# Patient Record
Sex: Female | Born: 1935
Health system: Southern US, Community
[De-identification: ages and names within clinical notes are randomized; demographics above are authoritative.]

## PROBLEM LIST (undated history)

## (undated) DIAGNOSIS — I509 Heart failure, unspecified: Secondary | ICD-10-CM

## (undated) DIAGNOSIS — G4700 Insomnia, unspecified: Secondary | ICD-10-CM

## (undated) DIAGNOSIS — T7840XA Allergy, unspecified, initial encounter: Secondary | ICD-10-CM

## (undated) DIAGNOSIS — Z86711 Personal history of pulmonary embolism: Secondary | ICD-10-CM

## (undated) DIAGNOSIS — M069 Rheumatoid arthritis, unspecified: Secondary | ICD-10-CM

## (undated) DIAGNOSIS — E785 Hyperlipidemia, unspecified: Secondary | ICD-10-CM

## (undated) DIAGNOSIS — L409 Psoriasis, unspecified: Secondary | ICD-10-CM

## (undated) DIAGNOSIS — Z9289 Personal history of other medical treatment: Secondary | ICD-10-CM

## (undated) DIAGNOSIS — J8410 Pulmonary fibrosis, unspecified: Secondary | ICD-10-CM

## (undated) DIAGNOSIS — I82409 Acute embolism and thrombosis of unspecified deep veins of unspecified lower extremity: Secondary | ICD-10-CM

## (undated) DIAGNOSIS — M810 Age-related osteoporosis without current pathological fracture: Secondary | ICD-10-CM

## (undated) DIAGNOSIS — M1990 Unspecified osteoarthritis, unspecified site: Secondary | ICD-10-CM

## (undated) DIAGNOSIS — I2510 Atherosclerotic heart disease of native coronary artery without angina pectoris: Secondary | ICD-10-CM

## (undated) HISTORY — PX: TOTAL KNEE ARTHROPLASTY: SHX125

## (undated) HISTORY — DX: Unspecified osteoarthritis, unspecified site: M19.90

## (undated) HISTORY — DX: Atherosclerotic heart disease of native coronary artery without angina pectoris: I25.10

## (undated) HISTORY — DX: Rheumatoid arthritis, unspecified: M06.9

## (undated) HISTORY — DX: Personal history of other medical treatment: Z92.89

## (undated) HISTORY — DX: Acute embolism and thrombosis of unspecified deep veins of unspecified lower extremity: I82.409

## (undated) HISTORY — DX: Age-related osteoporosis without current pathological fracture: M81.0

## (undated) HISTORY — DX: Allergy, unspecified, initial encounter: T78.40XA

## (undated) HISTORY — PX: CATARACT EXTRACTION: SUR2

## (undated) HISTORY — DX: Hyperlipidemia, unspecified: E78.5

## (undated) HISTORY — DX: Psoriasis, unspecified: L40.9

## (undated) HISTORY — DX: Insomnia, unspecified: G47.00

## (undated) HISTORY — DX: Personal history of pulmonary embolism: Z86.711

## (undated) HISTORY — DX: Pulmonary fibrosis, unspecified: J84.10

## (undated) HISTORY — PX: TONSILLECTOMY: SUR1361

---

## 1957-07-11 HISTORY — PX: APPENDECTOMY: SHX54

## 1972-07-11 HISTORY — PX: ABDOMINAL HYSTERECTOMY: SHX81

## 2004-11-08 ENCOUNTER — Encounter: Admission: RE | Admit: 2004-11-08 | Discharge: 2004-11-08 | Payer: Self-pay | Admitting: Endocrinology

## 2004-11-30 ENCOUNTER — Ambulatory Visit: Payer: Self-pay | Admitting: Pulmonary Disease

## 2005-03-15 ENCOUNTER — Encounter: Admission: RE | Admit: 2005-03-15 | Discharge: 2005-03-15 | Payer: Self-pay | Admitting: Endocrinology

## 2005-08-19 ENCOUNTER — Emergency Department (HOSPITAL_COMMUNITY): Admission: EM | Admit: 2005-08-19 | Discharge: 2005-08-19 | Payer: Self-pay | Admitting: Emergency Medicine

## 2005-09-01 ENCOUNTER — Other Ambulatory Visit: Admission: RE | Admit: 2005-09-01 | Discharge: 2005-09-01 | Payer: Self-pay | Admitting: Obstetrics and Gynecology

## 2005-09-02 ENCOUNTER — Ambulatory Visit: Admission: RE | Admit: 2005-09-02 | Discharge: 2005-09-02 | Payer: Self-pay | Admitting: Obstetrics and Gynecology

## 2005-10-18 ENCOUNTER — Ambulatory Visit: Payer: Self-pay | Admitting: Pulmonary Disease

## 2005-11-07 ENCOUNTER — Ambulatory Visit: Payer: Self-pay | Admitting: Pulmonary Disease

## 2005-11-10 ENCOUNTER — Ambulatory Visit: Payer: Self-pay | Admitting: Pulmonary Disease

## 2006-05-25 ENCOUNTER — Encounter: Admission: RE | Admit: 2006-05-25 | Discharge: 2006-05-25 | Payer: Self-pay | Admitting: Endocrinology

## 2006-06-29 ENCOUNTER — Ambulatory Visit: Payer: Self-pay | Admitting: Pulmonary Disease

## 2006-07-18 ENCOUNTER — Emergency Department (HOSPITAL_COMMUNITY): Admission: EM | Admit: 2006-07-18 | Discharge: 2006-07-18 | Payer: Self-pay | Admitting: Emergency Medicine

## 2006-12-06 ENCOUNTER — Ambulatory Visit: Payer: Self-pay | Admitting: Pulmonary Disease

## 2006-12-12 ENCOUNTER — Emergency Department (HOSPITAL_COMMUNITY): Admission: EM | Admit: 2006-12-12 | Discharge: 2006-12-12 | Payer: Self-pay | Admitting: Emergency Medicine

## 2006-12-13 ENCOUNTER — Encounter: Admission: RE | Admit: 2006-12-13 | Discharge: 2006-12-13 | Payer: Self-pay | Admitting: Endocrinology

## 2006-12-18 ENCOUNTER — Ambulatory Visit: Payer: Self-pay | Admitting: Internal Medicine

## 2006-12-18 LAB — CONVERTED CEMR LAB
BUN: 15 mg/dL (ref 6–23)
Basophils Relative: 0.4 % (ref 0.0–1.0)
CO2: 27 meq/L (ref 19–32)
Calcium: 9.4 mg/dL (ref 8.4–10.5)
Chloride: 110 meq/L (ref 96–112)
GFR calc Af Amer: 90 mL/min
INR: 1 (ref 0.9–2.0)
Lymphocytes Relative: 21.3 % (ref 12.0–46.0)
Monocytes Relative: 6.8 % (ref 3.0–11.0)
Neutro Abs: 7.4 10*3/uL (ref 1.4–7.7)
Platelets: 192 10*3/uL (ref 150–400)
Prothrombin Time: 11.8 s (ref 10.0–14.0)
RBC: 4.42 M/uL (ref 3.87–5.11)
RDW: 11.8 % (ref 11.5–14.6)

## 2006-12-19 ENCOUNTER — Ambulatory Visit: Payer: Self-pay | Admitting: Internal Medicine

## 2006-12-19 ENCOUNTER — Inpatient Hospital Stay (HOSPITAL_BASED_OUTPATIENT_CLINIC_OR_DEPARTMENT_OTHER): Admission: RE | Admit: 2006-12-19 | Discharge: 2006-12-19 | Payer: Self-pay | Admitting: Internal Medicine

## 2007-01-01 ENCOUNTER — Encounter: Payer: Self-pay | Admitting: Pulmonary Disease

## 2007-01-01 ENCOUNTER — Ambulatory Visit: Payer: Self-pay | Admitting: Internal Medicine

## 2007-01-03 ENCOUNTER — Ambulatory Visit: Payer: Self-pay | Admitting: Internal Medicine

## 2007-05-18 ENCOUNTER — Emergency Department (HOSPITAL_COMMUNITY): Admission: EM | Admit: 2007-05-18 | Discharge: 2007-05-18 | Payer: Self-pay | Admitting: Emergency Medicine

## 2007-06-16 ENCOUNTER — Encounter: Payer: Self-pay | Admitting: Pulmonary Disease

## 2007-06-16 DIAGNOSIS — E785 Hyperlipidemia, unspecified: Secondary | ICD-10-CM

## 2007-06-16 DIAGNOSIS — J309 Allergic rhinitis, unspecified: Secondary | ICD-10-CM | POA: Insufficient documentation

## 2007-06-16 DIAGNOSIS — Z86718 Personal history of other venous thrombosis and embolism: Secondary | ICD-10-CM | POA: Insufficient documentation

## 2007-07-03 ENCOUNTER — Ambulatory Visit: Payer: Self-pay | Admitting: Pulmonary Disease

## 2007-07-03 DIAGNOSIS — R079 Chest pain, unspecified: Secondary | ICD-10-CM

## 2007-07-16 ENCOUNTER — Telehealth (INDEPENDENT_AMBULATORY_CARE_PROVIDER_SITE_OTHER): Payer: Self-pay | Admitting: *Deleted

## 2007-08-31 ENCOUNTER — Encounter: Admission: RE | Admit: 2007-08-31 | Discharge: 2007-08-31 | Payer: Self-pay | Admitting: Endocrinology

## 2008-01-01 ENCOUNTER — Ambulatory Visit: Payer: Self-pay | Admitting: Pulmonary Disease

## 2008-01-03 ENCOUNTER — Ambulatory Visit: Payer: Self-pay | Admitting: Gastroenterology

## 2008-01-03 DIAGNOSIS — R1012 Left upper quadrant pain: Secondary | ICD-10-CM

## 2008-01-03 DIAGNOSIS — K5900 Constipation, unspecified: Secondary | ICD-10-CM | POA: Insufficient documentation

## 2008-01-09 ENCOUNTER — Ambulatory Visit: Payer: Self-pay | Admitting: Gastroenterology

## 2008-01-30 ENCOUNTER — Ambulatory Visit: Payer: Self-pay | Admitting: Pulmonary Disease

## 2008-02-05 ENCOUNTER — Telehealth (INDEPENDENT_AMBULATORY_CARE_PROVIDER_SITE_OTHER): Payer: Self-pay | Admitting: *Deleted

## 2008-05-30 ENCOUNTER — Encounter: Admission: RE | Admit: 2008-05-30 | Discharge: 2008-05-30 | Payer: Self-pay | Admitting: Otolaryngology

## 2008-06-30 ENCOUNTER — Ambulatory Visit: Payer: Self-pay | Admitting: Pulmonary Disease

## 2008-06-30 DIAGNOSIS — J8410 Pulmonary fibrosis, unspecified: Secondary | ICD-10-CM | POA: Insufficient documentation

## 2008-08-31 ENCOUNTER — Emergency Department (HOSPITAL_COMMUNITY): Admission: EM | Admit: 2008-08-31 | Discharge: 2008-08-31 | Payer: Self-pay | Admitting: Emergency Medicine

## 2008-11-21 ENCOUNTER — Ambulatory Visit (HOSPITAL_COMMUNITY): Admission: RE | Admit: 2008-11-21 | Discharge: 2008-11-21 | Payer: Self-pay | Admitting: Endocrinology

## 2008-12-11 ENCOUNTER — Encounter: Admission: RE | Admit: 2008-12-11 | Discharge: 2008-12-11 | Payer: Self-pay | Admitting: Endocrinology

## 2008-12-18 ENCOUNTER — Encounter: Admission: RE | Admit: 2008-12-18 | Discharge: 2008-12-31 | Payer: Self-pay | Admitting: Endocrinology

## 2009-02-05 ENCOUNTER — Ambulatory Visit: Payer: Self-pay | Admitting: Pulmonary Disease

## 2009-07-14 ENCOUNTER — Telehealth (INDEPENDENT_AMBULATORY_CARE_PROVIDER_SITE_OTHER): Payer: Self-pay | Admitting: *Deleted

## 2009-08-10 ENCOUNTER — Ambulatory Visit: Payer: Self-pay | Admitting: Pulmonary Disease

## 2009-08-10 ENCOUNTER — Encounter: Payer: Self-pay | Admitting: Pulmonary Disease

## 2009-08-12 ENCOUNTER — Telehealth (INDEPENDENT_AMBULATORY_CARE_PROVIDER_SITE_OTHER): Payer: Self-pay | Admitting: *Deleted

## 2009-08-18 ENCOUNTER — Ambulatory Visit: Payer: Self-pay | Admitting: Internal Medicine

## 2009-08-18 DIAGNOSIS — L299 Pruritus, unspecified: Secondary | ICD-10-CM

## 2009-08-25 LAB — CONVERTED CEMR LAB
ALT: 18 units/L (ref 0–35)
Alkaline Phosphatase: 73 units/L (ref 39–117)
Basophils Relative: 0.7 % (ref 0.0–3.0)
Bilirubin, Direct: 0.1 mg/dL (ref 0.0–0.3)
Eosinophils Relative: 1 % (ref 0.0–5.0)
Lymphocytes Relative: 36.4 % (ref 12.0–46.0)
MCV: 92.6 fL (ref 78.0–100.0)
Monocytes Absolute: 0.6 10*3/uL (ref 0.1–1.0)
Neutrophils Relative %: 54.6 % (ref 43.0–77.0)
RBC: 4.33 M/uL (ref 3.87–5.11)
Total Protein: 7.4 g/dL (ref 6.0–8.3)
WBC: 8 10*3/uL (ref 4.5–10.5)

## 2009-09-17 ENCOUNTER — Ambulatory Visit: Payer: Self-pay | Admitting: Internal Medicine

## 2010-02-08 ENCOUNTER — Ambulatory Visit: Payer: Self-pay | Admitting: Pulmonary Disease

## 2010-06-11 ENCOUNTER — Ambulatory Visit: Payer: Self-pay | Admitting: Pulmonary Disease

## 2010-06-11 ENCOUNTER — Telehealth: Payer: Self-pay | Admitting: Pulmonary Disease

## 2010-06-11 DIAGNOSIS — J209 Acute bronchitis, unspecified: Secondary | ICD-10-CM | POA: Insufficient documentation

## 2010-06-18 ENCOUNTER — Encounter
Admission: RE | Admit: 2010-06-18 | Discharge: 2010-06-18 | Payer: Self-pay | Source: Home / Self Care | Attending: Endocrinology | Admitting: Endocrinology

## 2010-06-23 ENCOUNTER — Telehealth: Payer: Self-pay | Admitting: Pulmonary Disease

## 2010-08-10 NOTE — Progress Notes (Signed)
  Phone Note Other Incoming   Caller: Jodie Action Taken: Information Sent Initial call taken by: Marijean Niemann all Recent Cardiac over to [facility] to fax [phone] [facility]  July 14, 2009 2:39 PM

## 2010-08-10 NOTE — Assessment & Plan Note (Signed)
Summary: rov for IPF   Visit Type:  Follow-up Primary Provider/Referring Provider:  Ardyth Harps MD  CC:  Pt is here for a 6 month f/u appt.  Pt had PFTs prior to appt today.  The patient c/o prod cough x1-2 months. Mucus is yellow in color. She says her SOB is no better or worse. [name]  History of Present Illness: The pt comes in today for f/u of her known ISLD, most c/w IPF.  She had pfts today which show no obstruction, mild restriction, and a severe decrease in dlco that corrects with Av.  The good news is they are unchanged from a year ago.  The pt feels that her exertional tolerance is near her usual baseline.  She has seen no decline in function.  She has had a little more cough than in the past, and can produce very scant quantities of mucus at times.  Current Medications (verified): 1)  Alprazolam 0.5 Mg Tabs (Alprazolam) .... Take By Mouth As Needed 2)  Citrucel  Powd (Methylcellulose (Laxative)) .... As Needed 3)  Phytosterol Complex 500 Mg Caps (Phytosterol Esters) .[name].. 1 By Mouth Daily 4)  Skeletal Strength .... 1 By Mouth Two Times A Day 5)  Food Enzumes .[name].. 1 By Mouth Two Times A Day 6)  Proactazyme .[name].. 1 By Mouth Two Times A Day  Allergies (verified): 1)  ! Niacin 2)  ! Penicillin  Review of Systems  The patient denies shortness of breath with activity, shortness of breath at rest, productive cough, non-productive cough, coughing up blood, chest pain, irregular heartbeats, acid heartburn, indigestion, loss of appetite, weight change, abdominal pain, difficulty swallowing, sore throat, tooth/dental problems, headaches, nasal congestion/difficulty breathing through nose, sneezing, itching, ear ache, anxiety, depression, hand/feet swelling, joint stiffness or pain, rash, change in color of mucus, and fever.    Vital Signs:  Patient profile:   77 year old female Height:      65 inches (165.10 cm) Weight:      160 pounds (72.73 kg) BMI:     26.72 O2 Sat:      92 % on Room  air Temp:     97.9 degrees F (36.61 degrees C) oral Pulse rate:   92 / minute BP sitting:   108 / 72  (left arm) Cuff size:   regular  Vitals Entered By: Michel Bickers CMA (February 08, 2010 10:02 AM)  O2 Sat at Rest %:  92 O2 Flow:  Room air CC: Pt is here for a 6 month f/u appt.  Pt had PFTs prior to appt today.  The patient c/o prod cough x1-2 months. Mucus is yellow in color. She says her SOB is no better or worse.  Comments Medications reviewed with patient Michel Bickers CMA  February 08, 2010 10:03 AM   Physical Exam  General:  wd female in nad Nose:  mild erythema of mucosa with mild edema bilat. Lungs:  crackles 1/3 up bilat, no wheezing or rhonchi Heart:  rrr, no mrg Extremities:  mild ankle edema with varicosities Neurologic:  alert and oriented, moves all 4.   Impression & Recommendations:  Problem # 1:  PULMONARY FIBROSIS, IDIOPATHIC (ICD-516.3) the pt most likely has IPF, but has had radiographic and pft stability.  She remains very functional, and reports no change in her baseline exertional tolerance.  Will continue to monitor her closely with cxr and pfts yearly, and would consider a trial of prednisone should she have a decline or acute  flare.  Problem # 2:  ALLERGIC RHINITIS (ICD-477.9)  I suspect her cough is related to postnasal drip from allergic rhinitis, although I still can't exclude mild chronic sinusitis.  Will get her to work on a good nasal hygiene regimen for a few weeks, and see if she has improvement.  Medications Added to Medication List This Visit: 1)  Phytosterol Complex 500 Mg Caps (Phytosterol esters) .[name].. 1 by mouth daily 2)  Skeletal Strength  .... 1 by mouth two times a day 3)  Food Enzumes  .Marland KitchenMarland Kitchen. 1 by mouth two times a day 4)  Proactazyme  .Marland KitchenMarland Kitchen. 1 by mouth two times a day 5)  Flonase 50 Mcg/act Susp (Fluticasone propionate) .... Two puffs each nostril daily  Other Orders: Est. Patient Level III (16109)  Patient Instructions: 1)  trial of  flonase nasal spray, 2 each nostril each am. 2)  try either zyrtec 10mg  or chlorpheniramine 8mg  at bedtime for next 3 weeks to see if nasal symptoms, cough improve 3)  stay as active as possible 4)  followup with me in 6mos with cxr on same day.  Prescriptions: FLONASE 50 MCG/ACT  SUSP (FLUTICASONE PROPIONATE) Two puffs each nostril daily  #1 x 6   Entered and Authorized by:   Barbaraann Share MD   Signed by:   Barbaraann Share MD on 02/08/2010   Method used:   Print then Give to Patient   RxID:   [id number]

## 2010-08-10 NOTE — Assessment & Plan Note (Signed)
Summary: allergies/self ref/apc   Vital Signs:  Patient profile:   78 year old female Height:      65 inches Weight:      159.50 pounds BMI:     26.64 O2 Sat:      97 % on Room air Pulse rate:   81 / minute BP sitting:   118 / 78  (left arm) Cuff size:   regular  Vitals Entered By: Reynaldo Minium CMA (August 18, 2009 2:39 PM)  O2 Flow:  Room air  Primary Provider/Referring Provider:  Ardyth Harps MD   History of Present Illness:  Dr Shelle Iron: 08/10/09-CC:  Pt is here for a 6 month f/u appt.   Pt c/o coughing up dark brown sputum.  Pt states "very little" increased sob but believes this is d/t the cold weather.  .  History of Present Illness: The pt comes in today for f/u of her ILD, presumed secondary to IPF.  We have been following her clinically, and she has been doing well.  She denies any change in her exertional tolerance, and was having no cough until Dec last year.  She developed a cough productive of discolored mucus, but decided to "ride it out".  It has improved, but is still present.  She denies any chest congestion, but does have paranasal sinus pressure and some congestion.  She denies definite postnasal drip.  August 18, 2009- New self referral - 59 yoF with c/o itiching. I reviewed Dr Teddy Spike recent notes. He4 sees her for ILD.[name] She says generalized itchinbg began about 2 months ago, without visible rash. Tongue swoll. Episodic, every 2-3 weeks. No respiratory distress, tearing, sneeze, wheeze cough, joint pain, nodes or fever. She thinks it may happen more often when she is home, but no real pattern of food, cosmetics or bath/ laundry products or other exposures. Last episode was 1-2 weeks ago. Antihistamines help within 30-45 minutes. No significant allergy history except sme rhinits with season change. No liver disease.    Current Medications (verified): 1)  Alprazolam 0.5 Mg Tabs (Alprazolam) .... Take By Mouth As Needed 2)  Nasonex 50 Mcg/act Susp (Mometasone  Furoate) .[name].. 1 Spray in Each Nostril Once Daily As Needed 3)  Citrucel  Powd (Methylcellulose (Laxative)) .... As Needed  Allergies (verified): 1)  ! Niacin 2)  ! 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 25+ yrs ago Alcohol Use - yes 2/month Daily Caffeine Use  3/week Illicit Drug Use - no  Risk Factors: Smoking Status: quit (01/03/2008)  Past Medical History: ALLERGIC RHINITIS (ICD-477.9) PULMONARY EMBOLISM, HX OF (ICD-V12.51) DEEP VENOUS THROMBOPHLEBITIS (ICD-453.40) DYSLIPIDEMIA (ICD-272.4) Pulmonary fibrosis  Family History: Family History of Heart Disease: Father died Family History of Kidney Disease: Daughter Mother - died age 34, old age  Review of Systems       The patient complains of shortness of breath at rest.  The patient denies shortness of breath with activity, productive cough, non-productive cough, coughing up blood, chest pain, irregular heartbeats, acid heartburn, indigestion, loss of appetite, weight change, abdominal pain, difficulty swallowing, sore throat, tooth/dental problems, headaches, nasal congestion/difficulty breathing through nose, sneezing, itching, ear ache, anxiety, depression, hand/feet  swelling, joint stiffness or pain, rash, change in color of mucus, and fever.    Physical Exam  Additional Exam:  General: A/Ox3; pleasant and cooperative, NAD, wdwn SKIN: no rash, lesions NODES: no lymphadenopathy HEENT: [place]/AT, EOM- WNL, Conjuctivae- clear, PERRLA, TM-WNL, Nose- clear, Throat- clear and wnl, dentures, Mellampatti  II NECK: Supple w/ fair ROM,  JVD- none, normal carotid impulses w/o bruits Thyroid- normal to palpation CHEST: Unlabored, few crackles, no dullness or cough HEART: RRR, no m/g/r heard ABDOMEN: Soft and nl; nml bowel sounds; no organomegaly or masses noted DGU:YQIH, nl pulses, no edema, cyanosis or clubbing, surgical scar left knee NEURO: Grossly intact to observation      Impression & Recommendations:  Problem # 1:  PRURITUS (ICD-698.9)  New complaint of nonspecific generalized pruitus without recognized trigger or pattern and without rash or systemic symptoms. First thought would be dry skin/ winter itch. Antinhsitamine relieves her that day, so elaborate changes may not be appropriate. I have discussed food diary and mentioned some possible associations to watch for. We will get some basic labs looking for allergic/ IgE pattern response. I'm not sure how reliable her sense of throat tightness may be.  Other Orders: New Patient Level III (47425) T-IgE (Immunoglobulin E) (95638-75643) TLB-CBC Platelet - w/Differential (85025-CBCD) TLB-Hepatic/Liver Function Pnl (80076-HEPATIC) TLB-Sedimentation Rate (ESR) (85652-ESR)  Patient Instructions: 1)  Please schedule a follow-up appointment in 1 month. 2)  Watch for patterns if the itching returns: what have you worn, eaten or otherwise been exposed to? 3)  Lab 4)  It is ok to take an antihistamine as needed

## 2010-08-10 NOTE — Progress Notes (Signed)
Summary: reaction/cb  Phone Note Call from Patient Call back at Home Phone [phone]   Caller: Patient Call For: clance Summary of Call: pt thinks she is having a reaction from her antibodic leviquin not sleeping and has little bumps on the top of her hands Initial call taken by: Lacinda Axon,  August 12, 2009 12:07 PM  Follow-up for Phone Call        pt states since she has started Levaquin she has been having trouble sleeping, and she has developed darks spot on both of her hands. She denies any swelling in face or mouth, and no difficulty breathing. Please advise. Carron Curie CMA  August 12, 2009 12:15 PM   Additional Follow-up for Phone Call Additional follow up Details #1::        I suspect these are not reactions to levaquin, but if they are, they due not represent an "allergic reaction".  I would continue to take abx, and if the areas on her hands worsen, then let us know Additional Follow-up by: Barbaraann Share MD,  August 12, 2009 4:54 PM    Additional Follow-up for Phone Call Additional follow up Details #2::    The patient had verbal understanding that Dr. Shelle Iron thinks she should finish the course of Levaquin and call if her sxs get worse. Follow-up by: Michel Bickers CMA,  August 12, 2009 5:17 PM

## 2010-08-10 NOTE — Miscellaneous (Signed)
Summary: Orders Update  Clinical Lists Changes  Orders: Added new Test order of T-2 View CXR (71020TC) - Signed 

## 2010-08-10 NOTE — Assessment & Plan Note (Signed)
Summary: 1 MONTH/ MBW   Primary Provider/Referring Provider:  Ardyth Harps MD  CC:  1 month follow up visit-no itching and doing "real good"..  History of Present Illness: History of Present Illness: The pt comes in today for f/u of her ILD, presumed secondary to IPF.  We have been following her clinically, and she has been doing well.  She denies any change in her exertional tolerance, and was having no cough until Dec last year.  She developed a cough productive of discolored mucus, but decided to "ride it out".  It has improved, but is still present.  She denies any chest congestion, but does have paranasal sinus pressure and some congestion.  She denies definite postnasal drip.  August 18, 2009- New self referral - 77 yoF with c/o itiching. I reviewed Dr Teddy Spike recent notes. He4 sees her for ILD.[name] She says generalized itchinbg began about 2 months ago, without visible rash. Tongue swoll. Episodic, every 2-3 weeks. No respiratory distress, tearing, sneeze, wheeze cough, joint pain, nodes or fever. She thinks it may happen more often when she is home, but no real pattern of food, cosmetics or bath/ laundry products or other exposures. Last episode was 1-2 weeks ago. Antihistamines help within 30-45 minutes. No significant allergy history except sme rhinits with season change. No liver disease.  September 17, 2009-Pruritus, rhinosinusitis, ILD, hx PE Has not had much itch or sneeze.since last here. Little cough- some morning phlegm. CBC/Diff, Sed rate, LFT and IgE were all normal.     Current Medications (verified): 1)  Alprazolam 0.5 Mg Tabs (Alprazolam) .... Take By Mouth As Needed 2)  Nasonex 50 Mcg/act Susp (Mometasone Furoate) .[name].. 1 Spray in Each Nostril Once Daily As Needed 3)  Citrucel  Powd (Methylcellulose (Laxative)) .... As Needed  Allergies (verified): 1)  ! Niacin 2)  ! 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    Vital Signs:  Patient profile:   77 year old female Height:      65 inches Weight:      162.13 pounds BMI:     27.08 O2 Sat:      92 % on Room air Pulse rate:   90 / minute BP sitting:   104 / 56  (left arm) Cuff size:   regular  Vitals Entered By: Reynaldo Minium CMA (September 17, 2009 2:31 PM)  O2 Flow:  Room air  Physical Exam  Additional Exam:  General: A/Ox3; pleasant and cooperative, NAD, wdwn SKIN: no rash, lesions NODES: no lymphadenopathy HEENT: [place]/AT, EOM- WNL, Conjuctivae- clear, PERRLA, TM-WNL, Nose- clear, Throat- clear and wnl, dentures, Mellampatti  II NECK:  Supple w/ fair ROM, JVD- none, normal carotid impulses w/o bruits Thyroid- normal to palpation CHEST: Definitie crackiles to midback are more evident to me than last visit. HEART: RRR, no m/g/r heard ABDOMEN: Soft and nl; nml bowel sounds; no organomegaly or masses noted ZOX:WRUE, nl pulses, no edema, cyanosis or clubbing, surgical scar left  knee NEURO: Grossly intact to observation      Impression & Recommendations:  Problem # 1:  PRURITUS (ICD-698.9)  This may have been dry skin/ winter itch. It seems to have resolved at this time and first pass lab results were normal. She is welconme to return as needed.  Other Orders: Est. Patient Level II (45409)  Patient Instructions: 1)  Please schedule a follow-up appointment as needed. 2)  Come back if you need me.

## 2010-08-10 NOTE — Progress Notes (Signed)
Summary: speak to nurse  Phone Note Call from Patient Call back at Home Phone [phone]   Caller: Patient Call For: clance Summary of Call: Pt c/o coughing, pain around back of rib cage  x a few days, worse this morning, pls advise.//[facility] [address] Initial call taken by: Darletta Moll,  June 11, 2010 10:45 AM  Follow-up for Phone Call        Spoke with pt and she states that she has a productive cough, yellow phlegm with some red mixed in. increased SOB, chest is soreness as well that has all been gettin gworse x 2 weeks. Pt scheduled to see Dr. Craige Cotta today at 4:30. Carron Curie CMA  June 11, 2010 12:19 PM

## 2010-08-10 NOTE — Assessment & Plan Note (Signed)
Summary: prod cough, blood tinged//jrc   Visit Type:  Acute visit Primary Provider/Referring Provider:  Ardyth Harps MD  CC:  Acute visit...[name] [name]...pt c/o cough with yellow blood-streaked mucus...chest tightness...sob with exertion and wheezing...all sxs x3 weeks.  History of Present Illness: The pt comes in today for f/u of her known ISLD, most c/w IPF.  She had pfts today which show no obstruction, mild restriction, and a severe decrease in dlco that corrects with Av.  The good news is they are unchanged from a year ago.  The pt feels that her exertional tolerance is near her usual baseline.  She has seen no decline in function.  She has had a little more cough than in the past, and can produce very scant quantities of mucus at times.  06/11/10 -- c/o more cough and chest congestion.  Has some discomfort in ribs with cough.  She has increased sputum with yellow color.  Also has some blood streaking in sputum.  She gets some wheeze that clears after she coughs.  She has been getting more winded.  She denies fever, but has some sinus congestion.  Her daughter had pneumonia over thanksgiving.   Current Medications (verified): 1)  Alprazolam 0.5 Mg Tabs (Alprazolam) .... Take By Mouth As Needed 2)  Citrucel  Powd (Methylcellulose (Laxative)) .... As Needed 3)  Phytosterol Complex 500 Mg Caps (Phytosterol Esters) .[name].. 1 By Mouth Daily 4)  Skeletal Strength .... 1 By Mouth Two Times A Day 5)  Food Enzumes .[name].. 1 By Mouth Two Times A Day 6)  Proactazyme .[name].. 1 By Mouth Two Times A Day 7)  Flonase 50 Mcg/act  Susp (Fluticasone Propionate) .... Two Puffs Each Nostril Daily  Allergies (verified): 1)  ! Niacin 2)  ! Penicillin  Past History:  Past Medical History: Reviewed history from 08/18/2009 and no changes required. ALLERGIC RHINITIS (ICD-477.9) PULMONARY EMBOLISM, HX OF (ICD-V12.51) DEEP VENOUS THROMBOPHLEBITIS (ICD-453.40) DYSLIPIDEMIA (ICD-272.4) Pulmonary fibrosis  Past  Surgical History: Reviewed history from 06/16/2007 and no changes required. Hysterectomy Appendectomy Knee Replacement 1997  Family History: Reviewed history from 08/18/2009 and no changes required. Family History of Heart Disease: Father died Family History of Kidney Disease: Daughter Mother - died age 77, old age  Social History: Reviewed history from 01/03/2008 and no changes required. 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 Alcohol Use - yes 2/month Daily Caffeine Use  3/week Illicit Drug Use - no  Vital Signs:  [name] profile:   77 year old female Height:      65 inches (165.10 cm) Weight:      148.25 pounds (67.39 kg) BMI:     24.76 O2 Sat:      98 % on Room air Temp:     97.4 degrees F (36.33 degrees C) oral Pulse rate:   77 / minute BP sitting:   116 / 78  (left arm) Cuff size:   regular  Vitals Entered By: Michel Bickers CMA (June 11, 2010 4:34 PM)  O2 Sat at Rest %:  98 O2 Flow:  Room air CC: Acute visit...[name] [name]...pt c/o cough with yellow blood-streaked mucus...chest tightness...sob with exertion and wheezing...all sxs x3 weeks Comments Medications reviewed with [name] Michel Bickers [facility]  June 11, 2010 4:45 PM   Physical Exam  General:  wd female in nad Nose:  mild erythema of mucosa with mild edema bilat. Mouth:  no exudate Neck:  no JVD.  Lungs:  crackles 1/3 up bilat, no wheezing or rhonchi Heart:  rrr, no mrg Abdomen:  soft, nontender Extremities:  mild ankle edema with varicosities Neurologic:  alert and oriented, moves all 4. Cervical Nodes:  no significant adenopathy Psych:  alert and cooperative; normal mood and affect; normal attention span and concentration   Impression & Recommendations:  Problem # 1:  ACUTE BRONCHITIS (ICD-466.0) She has an acute bronchitis.  I will chest xray to exclude other causes of her symptoms.  Will give her a course of  zithromax.  I don't think she needs prednisone or inhalers at this time.  Advised her to call if her symptoms do not improve.  Problem # 2:  PULMONARY FIBROSIS, IDIOPATHIC (ICD-516.3) Will further assess with chest xray.  She is to f/u with Dr. Shelle Iron.  Medications Added to Medication List This Visit: 1)  Zithromax Z-pak 250 Mg Tabs (Azithromycin) .... Use as directed  Complete Medication List: 1)  Alprazolam 0.5 Mg Tabs (Alprazolam) .... Take by mouth as needed 2)  Citrucel Powd (Methylcellulose (laxative)) .... As needed 3)  Phytosterol Complex 500 Mg Caps (Phytosterol esters) .[name].. 1 by mouth daily 4)  Skeletal Strength  .... 1 by mouth two times a day 5)  Food Enzumes  .Marland KitchenMarland Kitchen. 1 by mouth two times a day 6)  Proactazyme  .Marland KitchenMarland Kitchen. 1 by mouth two times a day 7)  Flonase 50 Mcg/act Susp (Fluticasone propionate) .... Two puffs each nostril daily 8)  Zithromax Z-pak 250 Mg Tabs (Azithromycin) .... Use as directed  Other Orders: Est. [name] Level IV (99214) T-2 View CXR (71020TC)  [name] Instructions: 1)  Zithromax (antibiotic): 2 pills on first day, then 1 pill once daily for 4 days 2)  Chest xray today 3)  Follow up with Dr. Shelle Iron in 2 months Prescriptions: ZITHROMAX Z-PAK 250 MG TABS (AZITHROMYCIN) use as directed  #1 x 0   Entered and Authorized by:   Coralyn Helling MD   Signed by:   Coralyn Helling MD on 06/11/2010   Method used:   Electronically to        [facility]. [phone]* (retail)       [address]       [place], [place]  [address]       Ph: [phone]       Fax: [phone]   RxID:   [id number]    Immunization History:  Influenza Immunization History:    Influenza:  historical (04/10/2010)

## 2010-08-10 NOTE — Assessment & Plan Note (Signed)
Summary: rov for ISLD, cough   Primary Provider/Referring Provider:  Ardyth Harps MD  CC:  Pt is here for a 6 month f/u appt.   Pt c/o coughing up dark brown sputum.  Pt states "very little" increased sob but believes this is d/t the cold weather.  .  History of Present Illness: The pt comes in today for f/u of her ISLD, presumed secondary to IPF.  We have been following her clinically, and she has been doing well.  She denies any change in her exertional tolerance, and was having no cough until Dec last year.  She developed a cough productive of discolored mucus, but decided to "ride it out".  It has improved, but is still present.  She denies any chest congestion, but does have paranasal sinus pressure and some congestion.  She denies definite postnasal drip.  Current Medications (verified): 1)  Alprazolam 0.5 Mg Tabs (Alprazolam) .... Take By Mouth As Needed 2)  Nasonex 50 Mcg/act Susp (Mometasone Furoate) .[name].. 1 Spray in Each Nostril Once Daily As Needed 3)  Citrucel  Powd (Methylcellulose (Laxative)) .... As Needed  Allergies (verified): 1)  ! Niacin 2)  ! Penicillin  Review of Systems      See HPI  Vital Signs:  Patient profile:   77 year old female Height:      65 inches Weight:      160.25 pounds BMI:     26.76 O2 Sat:      97 % on Room air Temp:     97.6 degrees F oral Pulse rate:   76 / minute BP sitting:   110 / 72  (left arm) Cuff size:   regular  Vitals Entered By: Arman Filter LPN (August 10, 2009 12:06 PM)  O2 Flow:  Room air CC: Pt is here for a 6 month f/u appt.   Pt c/o coughing up dark brown sputum.  Pt states "very little" increased sob but believes this is d/t the cold weather.   Comments Medications reviewed with patient Arman Filter LPN  August 10, 2009 12:06 PM    Physical Exam  General:  wd female in nad Nose:  no obvious anterior drainage. Lungs:  crackles 1/3 up bilat, but no wheezing or rhonchi Heart:  rrr Extremities:  no edema or  cyanosis Neurologic:  alert and oriented, moves all 4.   Impression & Recommendations:  Problem # 1:  SINUSITIS, ACUTE (ICD-461.9) the pt is having cough with purulent appearing mucus.  Although it has improved from Dec, it is persistent.  From her description, I suspect it is coming from acute sinusitis with postnasal drip.  Will treat with a short course of abx.  Problem # 2:  PULMONARY FIBROSIS, IDIOPATHIC (ICD-516.3) the pt appears to be at her usual baseline wrt her breathing.  Her cxr today is unchanged, and we will check her yearly pfts at the next visit this summer.  Medications Added to Medication List This Visit: 1)  Levaquin 750 Mg Tabs (Levofloxacin) .... One tablet by mouth daily  Other Orders: Est. Patient Level III (34742) Pulmonary Referral (Pulmonary)  Patient Instructions: 1)  take levaquin 750mg  one each day for 5 days for possible sinus infection 2)  use neilmed sinus rinses in am and pm until better, then as needed. 3)  followup with me in 6mos, and will do your yearly breathing studies at that time.   Prescriptions: LEVAQUIN 750 MG  TABS (LEVOFLOXACIN) One tablet by mouth daily  #5 x  0   Entered and Authorized by:   Barbaraann Share MD   Signed by:   Barbaraann Share MD on 08/10/2009   Method used:   Print then Give to Patient   RxID:   [id number]

## 2010-08-10 NOTE — Miscellaneous (Signed)
Summary: Orders Update pft charges  Clinical Lists Changes  Orders: Added new Service order of Carbon Monoxide diffusing w/capacity (94720) - Signed Added new Service order of Lung Volumes (94240) - Signed Added new Service order of Spirometry (Pre & Post) (94060) - Signed 

## 2010-08-11 ENCOUNTER — Ambulatory Visit: Payer: Self-pay | Admitting: Pulmonary Disease

## 2010-08-11 ENCOUNTER — Ambulatory Visit: Admit: 2010-08-11 | Payer: Self-pay | Admitting: Pulmonary Disease

## 2010-08-12 ENCOUNTER — Telehealth: Payer: Self-pay | Admitting: Pulmonary Disease

## 2010-08-12 NOTE — Progress Notes (Signed)
Summary: chest soreness  Phone Note Call from Patient Call back at Home Phone [phone]   Caller: Patient Call For: sood Reason for Call: Talk to Nurse Summary of Call: Patient calling w/ c/o chest soreness.  She was seen 12/2 and dx w/ bronchitis and was told to call if she had any other trouble.  Patient still has cough, but not congested.  Patient wanting to know if she needs appt or rx. Initial call taken by: Lehman Prom,  June 23, 2010 10:37 AM  Follow-up for Phone Call        Pt  states she saw [facility] on 06-11-10 and was given a zpak. She states she felt better while she was on abx but she has began to have productive cough with yellow phlegm. she states she is not coughing up as much phlegm but it is still present. She states she is still  having some chest tightness, but it has also improved some. She also states she still has SOB that is no better but no worse since last OV.  Pt wanted to know does she need another round of abx? Please advise. Carron Curie CMA  June 23, 2010 11:53 AM [email or web address]  Additional Follow-up for Phone Call Additional follow up Details #1::        Dr Craige Cotta put her on zpak.  let her know that zpak stays in system for 2 weeks, and would like to see how she does for the rest of the week. let us know friday if not making improvement. Additional Follow-up by: Barbaraann Share MD,  June 23, 2010 2:02 PM    Additional Follow-up for Phone Call Additional follow up Details #2::    Pt advsied of recs and will call if no improvment. Carron Curie CMA  June 23, 2010 2:11 PM

## 2010-08-18 NOTE — Progress Notes (Signed)
Summary: nos appt  Phone Note Call from Patient   Caller: [email or web address]  Call For: clance Summary of Call: Rsc nos from 2/1 to 2/14. Initial call taken by: Darletta Moll,  August 12, 2010 10:09 AM

## 2010-08-24 ENCOUNTER — Encounter: Payer: Self-pay | Admitting: Pulmonary Disease

## 2010-08-24 ENCOUNTER — Ambulatory Visit (INDEPENDENT_AMBULATORY_CARE_PROVIDER_SITE_OTHER): Payer: Medicare Other | Admitting: Pulmonary Disease

## 2010-08-24 DIAGNOSIS — J84111 Idiopathic interstitial pneumonia, not otherwise specified: Secondary | ICD-10-CM

## 2010-08-24 DIAGNOSIS — R05 Cough: Secondary | ICD-10-CM

## 2010-09-01 DIAGNOSIS — R05 Cough: Secondary | ICD-10-CM | POA: Insufficient documentation

## 2010-09-07 NOTE — Assessment & Plan Note (Signed)
Summary: rov for ISLD, cough   Primary Provider/Referring Provider:  Ardyth Harps MD  CC:  6 month f/u appt.  C/o sob with exertion and productive cough with "yellow and gray" colored sputum.  Pt c/o a "heaviness feeling" underneath breasts when she wakes up in the mornings. .  History of Present Illness: the pt comes in today for f/u of her known ISLD, probably secondary to IPF.  Overall, she is doing well.  However, has not been as active lately because of a CHI, and can tell that her endurance is not as good.  She is trying to get back to her active lifestyle.  Her only complaint today is mild cough with discolored mucus at times, but she believes this is coming from postnasal drip.  Current Medications (verified): 1)  Alprazolam 0.5 Mg Tabs (Alprazolam) .... Take By Mouth As Needed 2)  Citrucel  Powd (Methylcellulose (Laxative)) .... As Needed 3)  Phytosterol Complex 500 Mg Caps (Phytosterol Esters) .[name].. 1 By Mouth Daily 4)  Skeletal Strength .... 1 By Mouth Two Times A Day 5)  Food Enzumes .[name].. 1 By Mouth Two Times A Day 6)  Proactazyme .[name].. 1 By Mouth Two Times A Day 7)  Flonase 50 Mcg/act  Susp (Fluticasone Propionate) .... Two Puffs Each Nostril Daily As Needed 8)  Vitamin D .... Twice A Week  Allergies (verified): 1)  ! Niacin 2)  ! Penicillin  Review of Systems       The patient complains of shortness of breath with activity, productive cough, weight change, headaches, nasal congestion/difficulty breathing through nose, anxiety, hand/feet swelling, and change in color of mucus.  The patient denies shortness of breath at rest, non-productive cough, coughing up blood, chest pain, irregular heartbeats, acid heartburn, indigestion, loss of appetite, abdominal pain, difficulty swallowing, sore throat, tooth/dental problems, sneezing, itching, ear ache, depression, joint stiffness or pain, rash, and fever.    Vital Signs:  Patient profile:   77 year old female Height:      65  inches Weight:      164.50 pounds O2 Sat:      93 % on Room air Temp:     97.5 degrees F oral Pulse rate:   99 / minute BP sitting:   124 / 62  (left arm) Cuff size:   regular  Vitals Entered By: Arman Filter LPN (August 24, 2010 9:15 AM)  O2 Flow:  Room air CC: 6 month f/u appt.  C/o sob with exertion and productive cough with "yellow and gray" colored sputum.  Pt c/o a "heaviness feeling" underneath breasts when she wakes up in the mornings.  Comments Medications reviewed with patient Arman Filter LPN  August 24, 2010 9:15 AM    Physical Exam  General:  wd female in nad Nose:  erythematous mucosa, no purulence Mouth:  clear, but obvious drainage down back of throat. Lungs:  mild basilar crackles, no wheezing or rhonchi Heart:  rrr Extremities:  mild LE edema, no cyanosis  Neurologic:  alert, and oriented, moves all 4.   Impression & Recommendations:  Problem # 1:  PULMONARY FIBROSIS, IDIOPATHIC (ICD-516.3)  the pt is stable clinically and radiographically from this standpoint.    Problem # 2:  ALLERGIC RHINITIS (ICD-477.9) the pt's cough and mucus are coming from her upper airway, and she and I both feel this is postnasal drip.  Will try her on nasal ICS and otc antihistamine to see if improves.  Medications Added to Medication List This  Visit: 1)  Flonase 50 Mcg/act Susp (Fluticasone propionate) .... Two puffs each nostril daily as needed 2)  Vitamin D  .... Twice a week  Other Orders: Est. Patient Level III (16109)  Patient Instructions: 1)  will try on nasonex 2 sprays each nostril each am for a few weeks to see if helps postnasal drip 2)  trial of zyrtec 10mg  one each am for next few weeks 3)  followup with me in August with breathing studies the same day.

## 2010-10-09 ENCOUNTER — Emergency Department (INDEPENDENT_AMBULATORY_CARE_PROVIDER_SITE_OTHER): Payer: Medicare Other

## 2010-10-09 ENCOUNTER — Emergency Department (HOSPITAL_BASED_OUTPATIENT_CLINIC_OR_DEPARTMENT_OTHER)
Admission: EM | Admit: 2010-10-09 | Discharge: 2010-10-09 | Disposition: A | Discharge status: Home / Self Care | Payer: Medicare Other | Attending: Emergency Medicine | Admitting: Emergency Medicine

## 2010-10-09 DIAGNOSIS — J189 Pneumonia, unspecified organism: Secondary | ICD-10-CM | POA: Insufficient documentation

## 2010-10-09 DIAGNOSIS — R079 Chest pain, unspecified: Secondary | ICD-10-CM

## 2010-10-09 DIAGNOSIS — R0602 Shortness of breath: Secondary | ICD-10-CM | POA: Insufficient documentation

## 2010-10-09 DIAGNOSIS — R05 Cough: Secondary | ICD-10-CM

## 2010-10-09 DIAGNOSIS — R059 Cough, unspecified: Secondary | ICD-10-CM | POA: Insufficient documentation

## 2010-10-09 LAB — BASIC METABOLIC PANEL
BUN: 9 mg/dL (ref 6–23)
CO2: 24 mEq/L (ref 19–32)
Calcium: 8.7 mg/dL (ref 8.4–10.5)
GFR calc non Af Amer: >60 mL/min (ref >60)
Glucose, Bld: 97 mg/dL (ref 70–99)
Potassium: 3.9 mEq/L (ref 3.5–5.1)
Sodium: 139 mEq/L (ref 135–145)

## 2010-10-09 LAB — DIFFERENTIAL
Eosinophils Relative: 0 % (ref 0–5)
Lymphocytes Relative: 35 % (ref 12–46)
Lymphs Abs: 2.4 10*3/uL (ref 0.7–4.0)
Monocytes Absolute: 0.9 10*3/uL (ref 0.1–1.0)
Neutro Abs: 3.4 10*3/uL (ref 1.7–7.7)

## 2010-10-09 LAB — CBC
HCT: 36.9 % (ref 36.0–46.0)
Hemoglobin: 12.7 g/dL (ref 12.0–15.0)
MCHC: 34.4 g/dL (ref 30.0–36.0)
MCV: 85.4 fL (ref 78.0–100.0)
RDW: 13.3 % (ref 11.5–15.5)

## 2010-10-11 ENCOUNTER — Emergency Department (HOSPITAL_BASED_OUTPATIENT_CLINIC_OR_DEPARTMENT_OTHER)
Admission: EM | Admit: 2010-10-11 | Discharge: 2010-10-11 | Disposition: A | Discharge status: Home / Self Care | Payer: Medicare Other | Attending: Emergency Medicine | Admitting: Emergency Medicine

## 2010-10-11 DIAGNOSIS — T50995A Adverse effect of other drugs, medicaments and biological substances, initial encounter: Secondary | ICD-10-CM | POA: Insufficient documentation

## 2010-10-11 DIAGNOSIS — R220 Localized swelling, mass and lump, head: Secondary | ICD-10-CM | POA: Insufficient documentation

## 2010-10-11 DIAGNOSIS — Y92009 Unspecified place in unspecified non-institutional (private) residence as the place of occurrence of the external cause: Secondary | ICD-10-CM | POA: Insufficient documentation

## 2010-10-11 DIAGNOSIS — R319 Hematuria, unspecified: Secondary | ICD-10-CM | POA: Insufficient documentation

## 2010-10-11 DIAGNOSIS — R221 Localized swelling, mass and lump, neck: Secondary | ICD-10-CM | POA: Insufficient documentation

## 2010-10-11 LAB — URINE MICROSCOPIC-ADD ON

## 2010-10-11 LAB — URINALYSIS, ROUTINE W REFLEX MICROSCOPIC
Glucose, UA: NEGATIVE mg/dL
Ketones, ur: 15 mg/dL — AB
Leukocytes, UA: NEGATIVE
Nitrite: NEGATIVE
Protein, ur: 30 mg/dL — AB
Specific Gravity, Urine: 1.027 (ref 1.005–1.030)
Urobilinogen, UA: 1 mg/dL (ref 0.0–1.0)
pH: 6 (ref 5.0–8.0)

## 2010-10-13 ENCOUNTER — Ambulatory Visit: Payer: Medicare Other | Admitting: Adult Health

## 2010-10-18 ENCOUNTER — Telehealth: Payer: Self-pay | Admitting: Pulmonary Disease

## 2010-10-18 NOTE — Telephone Encounter (Signed)
Spoke w/ pt and she states when she came back from [place] she became very sick. Pt states her pmd keeps changing her meds and she is just getting worse. Pt states she has had some increased SOB and increased cough. Pt is coming in to see [facility] 4/11 at 2:00. Pt advised me she will be fine until then but would call us if she needs [place]

## 2010-10-19 ENCOUNTER — Encounter: Payer: Self-pay | Admitting: Pulmonary Disease

## 2010-10-20 ENCOUNTER — Ambulatory Visit (INDEPENDENT_AMBULATORY_CARE_PROVIDER_SITE_OTHER): Payer: Medicare Other | Admitting: Pulmonary Disease

## 2010-10-20 ENCOUNTER — Encounter: Payer: Self-pay | Admitting: Pulmonary Disease

## 2010-10-20 ENCOUNTER — Ambulatory Visit (INDEPENDENT_AMBULATORY_CARE_PROVIDER_SITE_OTHER)
Admission: RE | Admit: 2010-10-20 | Discharge: 2010-10-20 | Disposition: A | Discharge status: Home / Self Care | Payer: Medicare Other | Source: Ambulatory Visit | Attending: Pulmonary Disease | Admitting: Pulmonary Disease

## 2010-10-20 DIAGNOSIS — J189 Pneumonia, unspecified organism: Secondary | ICD-10-CM

## 2010-10-20 DIAGNOSIS — J84111 Idiopathic interstitial pneumonia, not otherwise specified: Secondary | ICD-10-CM

## 2010-10-20 MED ORDER — LEVOFLOXACIN 750 MG PO TABS: 750.0000 mg | ORAL_TABLET | Freq: Every day | ORAL | Status: AC

## 2010-10-20 MED ORDER — PREDNISONE 10 MG PO TABS: ORAL_TABLET | ORAL | Status: DC

## 2010-10-20 NOTE — Progress Notes (Signed)
  Subjective:    Patient ID: [name] [name], female    DOB: March 07, 1931, 77 y.o.   MRN: [id number]  HPI The pt comes in today for an acute sick visit.  She has known IPF, and recently diagnosed with pna while visiting [place].  She was treated with IV rocephin, then ofloxacin and was much improved.  When she returned to [place], her family member insisted she go to ER to "get checked out", and was given avelox and tussionex.  She developed ?allergy to something, and was changed to a zpak.  She comes in today where she remains very congested, is sob, and is still coughing with purulent mucus.  She denies having any fever.     Review of Systems  Constitutional: Positive for chills and diaphoresis. Negative for fever and unexpected weight change.  HENT: Positive for ear pain, nosebleeds, congestion, sore throat, rhinorrhea, sneezing, postnasal drip and sinus pressure. Negative for trouble swallowing and dental problem.   Eyes: Positive for itching. Negative for redness.  Respiratory: Positive for cough, chest tightness, shortness of breath and wheezing.   Cardiovascular: Negative for palpitations and leg swelling.  Gastrointestinal: Positive for nausea. Negative for vomiting.  Genitourinary: Negative for dysuria.  Musculoskeletal: Negative for joint swelling.  Skin: Negative for rash.  Neurological: Negative for headaches.  Hematological: Does not bruise/bleed easily.  Psychiatric/Behavioral: Negative for dysphoric mood. The patient is not nervous/anxious.        Objective:   Physical Exam Wd female in nad, but does appear ill Nares without d/c or purulence Op clear Chest with basilar crackles 1/2 up bilat, no wheezing Cor with rrr LE with mild edema, no cyanosis  Alert and oriented, moves all 4        Assessment & Plan:

## 2010-10-20 NOTE — Patient Instructions (Addendum)
Will treat with levaquin 750mg  one each day for 7days Prednisone taper as written mucinex dm extra strength one in am and pm for one week Will check cxr today, and will call you with results.  followup with me in 2 weeks.

## 2010-10-23 NOTE — Assessment & Plan Note (Signed)
The pt was diagnosed with pna on a recent trip to [place], and it is unclear if she has really gotten over this infection.  She is still symptomatic with purulent secretions, and it is obvious she needs further abx.  The recent rx with zpak was likely insufficient.  Will also check cxr today to make sure she does not have worsening infiltrate or complication such as pleural effusion.

## 2010-10-23 NOTE — Assessment & Plan Note (Signed)
The pt appears stable from this standpoint.  Will do f/u pfts this summer for comparison

## 2010-11-01 ENCOUNTER — Encounter: Payer: Self-pay | Admitting: Pulmonary Disease

## 2010-11-03 ENCOUNTER — Ambulatory Visit (INDEPENDENT_AMBULATORY_CARE_PROVIDER_SITE_OTHER): Payer: Medicare Other | Admitting: Pulmonary Disease

## 2010-11-03 ENCOUNTER — Encounter: Payer: Self-pay | Admitting: Pulmonary Disease

## 2010-11-03 DIAGNOSIS — J84111 Idiopathic interstitial pneumonia, not otherwise specified: Secondary | ICD-10-CM

## 2010-11-03 DIAGNOSIS — J019 Acute sinusitis, unspecified: Secondary | ICD-10-CM

## 2010-11-03 MED ORDER — MOXIFLOXACIN HCL 400 MG PO TABS: 400.0000 mg | ORAL_TABLET | Freq: Every day | ORAL | Status: AC

## 2010-11-03 NOTE — Patient Instructions (Signed)
Stop symbicort Will treat for sinusitis with avelox 400mg  one each day for 10 days. neilmed sinus rinses am and pm for 10 days If doing well, followup with me in 6mos.  If not improving, need to call me.

## 2010-11-03 NOTE — Assessment & Plan Note (Signed)
The pt is continuing to have sinus congestion, nasal voice, and cough with purulent mucus.  Her nasal exam is clearly abnormal today.  I am concerned her persistent symptoms are due to sinusitis.  Will extend her abx course, and work on nasal hygiene.

## 2010-11-03 NOTE — Assessment & Plan Note (Signed)
The pt feels her breathing has gotten back near her usual baseline.  She denies any chest congestion.

## 2010-11-03 NOTE — Progress Notes (Signed)
  Subjective:    Patient ID: [name] [name], female    DOB: 1930/12/01, 77 y.o.   MRN: [id number]  HPI The pt comes in today for f/u after her recent episode of acute asthmatic bronchitis.  She was treated with abx and a course of prednisone, and has had significant improvement.  She was also started on symbicort by her primary, and thinks this may have helped as well.  She has no airflow obstruction on pfts, but she may have had transient airway inflammation associated with her infection.  She feels she is 70% better, but still having cough with discolored mucus.  She has a very nasal voice with sinus congestion, but denies chest congestion.  She feels her sob has resolved.    Review of Systems  Constitutional: Negative for fever and unexpected weight change.  HENT: Negative for ear pain, nosebleeds, congestion, rhinorrhea, sneezing, trouble swallowing, dental problem, postnasal drip and sinus pressure.   Eyes: Positive for itching. Negative for redness.  Respiratory: Positive for cough, chest tightness, shortness of breath and wheezing.   Cardiovascular: Negative for palpitations and leg swelling.  Gastrointestinal: Negative for nausea and vomiting.  Genitourinary: Negative for dysuria.  Musculoskeletal: Negative for joint swelling.  Skin: Negative for rash.  Neurological: Negative for headaches.  Hematological: Does not bruise/bleed easily.  Psychiatric/Behavioral: Negative for dysphoric mood. The patient is not nervous/anxious.        Objective:   Physical Exam Wd female in nad Nares with swollen turbinates, inflammed mucosa, and purulence noted in right nare Op clear, no exudates Chest with basilar crackles, no wheezing or rhonchi Cor with rrr LE without edema, no cyanosis Alert and oriented, moves all 4        Assessment & Plan:

## 2010-11-23 NOTE — Cardiovascular Report (Signed)
[name], [name]                 ACCOUNT NO.:  [id number]   MEDICAL RECORD NO.:  [id number]          PATIENT TYPE:  OIB   LOCATION:  1967                         FACILITY:  [facility]   PHYSICIAN:  [name] [name], MDDATE OF BIRTH:  Feb 20, 1931   DATE OF PROCEDURE:  12/19/2006  DATE OF DISCHARGE:  12/19/2006                            CARDIAC CATHETERIZATION   PRIMARY CARE PHYSICIAN:  [name] [name], M.D.   PATIENT IDENTIFICATION:  The patient is a delightful 77 year old woman  with a history of pulmonary fibrosis and hyperlipidemia.  She has had  aggressive chest tightness and dyspnea on exertion.  We discussed the  risks and benefits of catheterization versus stress testing and she has  agreed to proceed with catheterization.  This was performed in the  outpatient catheterization lab.   PROCEDURES PERFORMED:  1. Right heart catheterization.  2. Left heart catheterization.  3. Left ventriculogram.  4. Selective coronary angiography.   DESCRIPTION OF PROCEDURE:  Risks and benefits of catheterization  explained.  Consent was signed and placed on the chart.  A 4-French  arterial sheath was placed in the right femoral artery using a modified  Seldinger technique.  Standard catheters including JL-4, 3-D RCA and  angled pigtail used for the procedure.  All catheter exchanges made over  wire.  A 7-French venous sheath was placed in the right femoral vein and  standard Swan-Ganz catheter was used for the right heart  catheterization.  There are no apparent complications.  Total 70 mL of  Omnipaque contrast was used.   HEMODYNAMIC RESULTS:  Right atrial pressure mean of 4, RV pressure 24/3.  PA pressure 27/11 with mean of 18.  Capillary wedge pressure mean 8.  Central aortic pressure 114/57 with a mean of 83.  RV pressure 115/2  with an EDP of 10.  Fick cardiac output was 4.3 L per minute.  Cardiac  index 2.4 L per minute per sq m.  There is no aortic stenosis or mitral   stenosis.   CORONARY ANATOMY:  Left main was normal.   The left anterior descending is a long vessel that wrapped the apex.  It  gave off two tiny diagonals and a large third diagonal.  There is a 30%  lesion in the mid LAD spanning the takeoff of the third diagonal.  There  was a 30% proximal lesion in the third diagonal.   Left circumflex was a moderate-sized vessel, gave off a tiny OM-1 as  well as a small OM-2 and OM-3.  There was a moderate sized OM-4.  There  were tandem 30% lesions in the mid left circumflex.   Right coronary artery was moderate-sized dominant vessel.  Gave off a  small RV branch, normal size PDA and a small posterolateral.  There is a  40% lesion in the mid section of the RCA.   Left ventriculogram done in the RAO position showed EF of 50-65% with no  focal wall motion abnormalities.   ASSESSMENT:  1. Mild nonobstructive coronary artery disease and a very mild      nonobstructive coronary disease as  described above.  2. Normal left ventricular function.  3. Normal pulmonary pressures.   PLAN:  I suspect [name] [name]'s symptoms may be related to  gastroesophageal reflux disease and related esophageal spasm.  We have  given her samples for a proton pump inhibitor and we will see how she  does.  Should she continue to have more symptoms, it may be reasonable  to have her formally evaluated by GI and perhaps consider a CAT scan of  her chest.      [name] Buckles. Bensimhon, MD  Electronically Signed     DRB/MEDQ  D:  12/19/2006  T:  12/20/2006  Job:  [id number]   cc:   [name] [name], M.D.

## 2010-11-23 NOTE — Consult Note (Signed)
[name] [name], [name] [name]                 ACCOUNT NO.:  [id number]   MEDICAL RECORD NO.:  [id number]           PATIENT TYPE:   LOCATION:                                 FACILITY:   PHYSICIAN:  Bevelyn Buckles. Bensimhon, MDDATE OF BIRTH:  11-28-1930   DATE OF CONSULTATION:  12/18/2006  DATE OF DISCHARGE:                                 CONSULTATION   REASON FOR CONSULTATION:  Chest pain and dyspnea on exertion.   HISTORY OF PRESENT ILLNESS:  [name] [name] is a very dynamic 77 year old  woman with a history of pulmonary fibrosis.  She denies any history of  known heart disease.  She has had a several-year history of intermittent  chest tightness over her left breast.  She apparently underwent a stress  test in [address] in 2004 which was negative.  She denies any  history of diabetes or hypertension.  She does have a history of  hyperlipidemia.   She tells me that over the past few years she has had intermittent chest  tightness.  Previously, it was once every few months.  However, over the  past few months she has noticed that it happens about three to four  times per week.  This can happen at any time, but mostly with stress.  There is no association with exertion.  She says she typically lies down  and the pain goes away after a few minutes.  It is associated with  shortness of breath, but no nausea or diaphoresis.  She also notes that  she has had progressive dyspnea on exertion over the past year.  She  attributes some of this to her arthritis in her right knee.  However,  she says she can still walk probably a mile at a slow pace.   This past Monday, she was lying in bed when she had the sudden onset of  severe central chest pain.  This was associated with shortness of  breath.  She went to the emergency room.  The pain was improved with  sublingual nitroglycerin.  She had several sets of cardiac markers which  were negative.  D-dimer was at the upper limits of normal.  EKG was  nonacute.  She was discharged home.  She has not had any chest pressure  since.  She has talked to Dr. Evlyn Kanner, who referred her here for further  evaluation.   REVIEW OF SYSTEMS:  She does have a history of gastroesophageal reflux  disease, but does not associate this with her chest pain.  She denies  any nausea, vomiting, fevers, or chills.  She does have arthritis,  particularly in the right knee.  Denies any melena or bright red blood  per rectum.   The remainder of the review of systems is negative, except for the HPI  and problem list.   PROBLEM LIST:  1. History of chest pain, status post reported negative stress test      four years ago in [address].  2. Pulmonary fibrosis.  3. Hyperlipidemia.  4. Osteoarthritis, status post left knee replacement.  5. History of bladder  tack.   CURRENT MEDICATIONS:  1. Famotidine 40 mg a day.  2. Aspirin 325 a day.  3. Vitamin D.   ALLERGIES:  NIACIN, with a rash.   SOCIAL HISTORY:  She is widowed.  She lives alone.  She was a former  Horticulturist, commercial.  Denies any alcohol use.  She does have  a history of tobacco, but quit in 1978.   FAMILY HISTORY:  Mother died at 61 due to old age.  Father died at 13  due to hardening of arteries.  She denies any history of myocardial  infarction.  She has two sisters who died in their 83s due to  Alzheimer's.   PHYSICAL EXAMINATION:  GENERAL:  She looks younger than her stated age.  She ambulates around the clinic without any respiratory difficulty.  VITAL SIGNS:  Blood pressure on initial check was 108/70, on manual  recheck 86/58, heart rate is 94, weight is 158.  HEENT:  Normal.  NECK:  Supple.  No JVD.  Carotids are 2+ bilaterally, without any  bruits.  There is no lymphadenopathy or thyromegaly.  CARDIAC:  PMI is nondisplaced.  She has a regular rate and rhythm, with  an S4.  No murmur.  LUNGS:  Have dry crackles about halfway up.  No wheezes or rales.  EXTREMITIES:   Warm, with no cyanosis, clubbing, or edema.  No rash.  Distal pulses are 1+ bilaterally.  NEUROLOGIC:  She is alert and oriented x3.  Affect is very pleasant.  Cranial nerves II-XII are grossly intact.  She moves all four  extremities without difficulty.   EKG shows normal sinus rhythm at a rate of 94, with no significant ST-T  wave abnormalities.  There is a left anterior fascicular block.   ASSESSMENT AND PLAN:  Chest pain and dyspnea.  Despite a lack of  significant risk factors, I am very concerned that her symptoms  represent myocardial ischemia.  However, it is possible that she may  also have a component of esophageal spasm due to reflux.  She is  scheduled to go to [employer] later today, but I told her that she  should reconsider this until we further clear up her heart status.  I  had a long discussion about the risks and benefits of cardiac  catheterization versus stress testing.  We have decided to postpone her  trip and proceed with right and left heart catheterization tomorrow in  the outpatient catheterization laboratory.  We have given her samples of  a PPI as well as a prescription for sublingual nitroglycerin.  I will  discuss this with Dr. Evlyn Kanner.      Bevelyn Buckles. Bensimhon, MD  Electronically Signed     DRB/MEDQ  D:  12/18/2006  T:  12/18/2006  Job:  [id number]   cc:   Jeannett Senior A. Evlyn Kanner, M.D.

## 2010-11-23 NOTE — Assessment & Plan Note (Signed)
[facility]                            CARDIOLOGY OFFICE NOTE   [name] [name], [name] [name]                          MRN:          [id number]  DATE:01/03/2007                            DOB:          Sep 03, 1930    This is a patient of Dr. Gala Romney and Dr. Adrian Prince.   This is a 77 year old, white female who presented with chest pain. She  underwent cardiac catheterization on December 19, 2006 which revealed mild  nonobstructive coronary artery disease with normal LV function and  normal pulmonary pressures. These were checked because she does have a  diagnosis of pulmonary fibrosis. The patient was placed on Pepcid with  thoughts of sending her to a gastroenterologist if her symptoms did not  improve. CT of her chest was also considered because of her pulmonary  fibrosis diagnosis which this was done back in January 2008 that showed  bilateral right greater than left subpleural honeycombing consistent  with the usual interstitial pneumonia or idiopathic pulmonary fibrosis  drug toxicity or connective tissue disease. Her chest x-ray at that time  also was compatible with pulmonary fibrosis. No new acute processes.   The patient has done extremely well on Pepcid. She said she has been  very compliant and she has had absolutely no chest pain. She feels well  and has no cardiac complaints.   CURRENT MEDICATIONS:  1. Pepcid 40 mg daily.  2. Aspirin 325 daily.  3. Vitamin D three weekly.   PHYSICAL EXAMINATION:  GENERAL:  This is a very pleasant, young looking,  77 year old white female in no acute distress.  VITAL SIGNS:  Blood pressure 112/70, pulse 78, weight 154.  NECK:  Without JVD, HJR, bruit or thyroid enlargement.  LUNGS:  Decreased breath sounds with fine basilar crackles throughout.  HEART:  Regular rate and rhythm at 78 beats per minute, normal S1 and  S2. No murmur, rub, bruit, thrill or heave noted.  ABDOMEN:  Soft without organomegaly, masses,  lesions, or abnormal  tenderness.  GROIN:  The right groin is without hematoma or hemorrhage.  EXTREMITIES:  Without clubbing, cyanosis or edema. She has good distal  pulses.   IMPRESSION:  1. Chest pain felt secondary to gastroesophageal reflux disease.  2. Cardiac cath revealed mild nonobstructive coronary artery disease      and normal left ventricular function with normal pulmonary      pressures.  3. History of pulmonary fibrosis.  4. Hyperlipidemia.  5. Osteoarthritis.  6. History of bladder tack.   PLAN:  The patient is stable from a cardiac standpoint. She will see Dr.  Gala Romney in followup in 2 months.      Jacolyn Reedy, PA-C  Electronically Signed      [facility] Salvia, MD, [facility]  Electronically Signed   ML/MedQ  DD: 01/03/2007  DT: 01/03/2007  Job #: [id number]   cc:   Jeannett Senior A. Evlyn Kanner, M.D.

## 2011-02-22 ENCOUNTER — Ambulatory Visit: Payer: Medicare Other | Admitting: Pulmonary Disease

## 2011-04-28 LAB — DIFFERENTIAL
Basophils Absolute: 0.1
Basophils Relative: 2 — ABNORMAL HIGH
Eosinophils Absolute: 0.2
Monocytes Absolute: 0.7
Monocytes Relative: 9
Neutro Abs: 3.6
Neutrophils Relative %: 47

## 2011-04-28 LAB — POCT CARDIAC MARKERS
Myoglobin, poc: 47.4
Operator id: 4761

## 2011-04-28 LAB — POCT I-STAT 3, ART BLOOD GAS (G3+)
Bicarbonate: 24.2 — ABNORMAL HIGH
TCO2: 25
pH, Arterial: 7.378
pO2, Arterial: 72 — ABNORMAL LOW

## 2011-04-28 LAB — POCT I-STAT 3, VENOUS BLOOD GAS (G3P V)
Bicarbonate: 23.4
Bicarbonate: 25.6 — ABNORMAL HIGH
O2 Saturation: 71
O2 Saturation: 75
TCO2: 25
TCO2: 27
pCO2, Ven: 42.3 — ABNORMAL LOW
pCO2, Ven: 42.9 — ABNORMAL LOW
pO2, Ven: 39
pO2, Ven: 41

## 2011-04-28 LAB — URINALYSIS, ROUTINE W REFLEX MICROSCOPIC
Bilirubin Urine: NEGATIVE
Ketones, ur: NEGATIVE
Nitrite: NEGATIVE
Protein, ur: NEGATIVE
Urobilinogen, UA: 0.2

## 2011-04-28 LAB — BASIC METABOLIC PANEL
CO2: 28
Calcium: 9.1
Chloride: 102
Creatinine, Ser: 0.77
Glucose, Bld: 93

## 2011-04-28 LAB — CBC
MCHC: 33.9
MCV: 86.9
RDW: 12.8

## 2011-05-05 ENCOUNTER — Ambulatory Visit: Payer: Medicare Other | Admitting: Pulmonary Disease

## 2011-05-06 ENCOUNTER — Ambulatory Visit: Payer: Medicare Other | Admitting: Pulmonary Disease

## 2011-05-09 ENCOUNTER — Ambulatory Visit (INDEPENDENT_AMBULATORY_CARE_PROVIDER_SITE_OTHER): Payer: Medicare Other | Admitting: Pulmonary Disease

## 2011-05-09 ENCOUNTER — Encounter: Payer: Self-pay | Admitting: Pulmonary Disease

## 2011-05-09 DIAGNOSIS — Z23 Encounter for immunization: Secondary | ICD-10-CM

## 2011-05-09 DIAGNOSIS — J84111 Idiopathic interstitial pneumonia, not otherwise specified: Secondary | ICD-10-CM

## 2011-05-09 NOTE — Patient Instructions (Signed)
Please get your recent records from [facility] and drop off at office for my review.  I will call you once I do so to discuss. Will give you a flu shot today. followup with me in 6mos, but will call you after I review your records.

## 2011-05-09 NOTE — Assessment & Plan Note (Signed)
The patient has had decreased exercise tolerance since the last visit, but admits that she has not been doing her exercise regimen.  She now has been given a diagnosis of possible "rheumatoid lung", and I will need to get these records from [facility] for review.  This is a more treatable entity than IPF, however I would not subject her to aggressive immunosuppression unless she is having significant decline in her PFTs or quality of life.  She did have followup PFTs recently, and we'll compare these to last year.  I have stressed to her the importance of getting back on an exercise program, and have offered to refer her to pulmonary rehabilitation.  She would like to try it on her own first.  I have told her that I will call her once I receive her recent records from [place].

## 2011-05-09 NOTE — Progress Notes (Signed)
  Subjective:    Patient ID: [name] [name], female    DOB: October 19, 1930, 77 y.o.   MRN: [id number]  HPI The patient comes in today for followup of her known interstitial disease, felt secondary to idiopathic pulmonary fibrosis.  She has had stable symptoms and pulmonary function studies most recently, however over the summer began to have worsening dyspnea on exertion.  She admits that she had not been exercising, and deconditioning was playing a role.  She has been evaluated at [facility] and [facility] in the past, and decided to get a followup visit there.  Apparently, they did repeat blood work and told her that she had "rheumatoid lung" I do not have those records available to me for review.  She denies any significant cough or mucus production.   Review of Systems  Constitutional: Negative for fever and unexpected weight change.  HENT: Positive for sinus pressure. Negative for ear pain, nosebleeds, congestion, sore throat, rhinorrhea, sneezing, trouble swallowing, dental problem and postnasal drip.   Eyes: Positive for itching. Negative for redness.  Respiratory: Positive for cough, shortness of breath and wheezing. Negative for chest tightness.   Cardiovascular: Negative for palpitations and leg swelling.  Gastrointestinal: Negative for nausea and vomiting.  Genitourinary: Negative for dysuria.  Musculoskeletal: Negative for joint swelling.  Skin: Negative for rash.  Neurological: Positive for headaches.  Hematological: Does not bruise/bleed easily.  Psychiatric/Behavioral: Negative for dysphoric mood. The patient is not nervous/anxious.        Objective:   Physical Exam Well-developed female in no acute distress Nose without purulence or discharge noted Chest with crackles one third of the way up bilaterally, no wheezing noted Cardiac exam with regular rate and rhythm Lower extremities without edema, no cyanosis noted Alert and oriented, moves all 4 extremities.       Assessment &  Plan:

## 2011-07-13 DIAGNOSIS — M62838 Other muscle spasm: Secondary | ICD-10-CM | POA: Diagnosis not present

## 2011-07-13 DIAGNOSIS — M999 Biomechanical lesion, unspecified: Secondary | ICD-10-CM | POA: Diagnosis not present

## 2011-07-13 DIAGNOSIS — M5137 Other intervertebral disc degeneration, lumbosacral region: Secondary | ICD-10-CM | POA: Diagnosis not present

## 2011-07-15 DIAGNOSIS — M5137 Other intervertebral disc degeneration, lumbosacral region: Secondary | ICD-10-CM | POA: Diagnosis not present

## 2011-07-15 DIAGNOSIS — M62838 Other muscle spasm: Secondary | ICD-10-CM | POA: Diagnosis not present

## 2011-07-15 DIAGNOSIS — M999 Biomechanical lesion, unspecified: Secondary | ICD-10-CM | POA: Diagnosis not present

## 2011-07-18 DIAGNOSIS — M999 Biomechanical lesion, unspecified: Secondary | ICD-10-CM | POA: Diagnosis not present

## 2011-07-18 DIAGNOSIS — M62838 Other muscle spasm: Secondary | ICD-10-CM | POA: Diagnosis not present

## 2011-07-18 DIAGNOSIS — M5137 Other intervertebral disc degeneration, lumbosacral region: Secondary | ICD-10-CM | POA: Diagnosis not present

## 2011-07-21 ENCOUNTER — Other Ambulatory Visit: Payer: Self-pay | Admitting: Endocrinology

## 2011-07-21 DIAGNOSIS — Z1231 Encounter for screening mammogram for malignant neoplasm of breast: Secondary | ICD-10-CM

## 2011-08-02 DIAGNOSIS — K219 Gastro-esophageal reflux disease without esophagitis: Secondary | ICD-10-CM | POA: Diagnosis not present

## 2011-08-02 DIAGNOSIS — E785 Hyperlipidemia, unspecified: Secondary | ICD-10-CM | POA: Diagnosis not present

## 2011-08-02 DIAGNOSIS — E559 Vitamin D deficiency, unspecified: Secondary | ICD-10-CM | POA: Diagnosis not present

## 2011-08-02 DIAGNOSIS — J8410 Pulmonary fibrosis, unspecified: Secondary | ICD-10-CM | POA: Diagnosis not present

## 2011-08-10 ENCOUNTER — Ambulatory Visit
Admission: RE | Admit: 2011-08-10 | Discharge: 2011-08-10 | Disposition: A | Discharge status: Home / Self Care | Payer: Medicare Other | Source: Ambulatory Visit | Attending: Endocrinology | Admitting: Endocrinology

## 2011-08-10 DIAGNOSIS — Z1231 Encounter for screening mammogram for malignant neoplasm of breast: Secondary | ICD-10-CM | POA: Diagnosis not present

## 2011-09-20 ENCOUNTER — Encounter (HOSPITAL_COMMUNITY): Payer: Self-pay | Admitting: *Deleted

## 2011-09-20 ENCOUNTER — Emergency Department (HOSPITAL_COMMUNITY): Payer: Medicare Other

## 2011-09-20 ENCOUNTER — Telehealth: Payer: Self-pay | Admitting: Pulmonary Disease

## 2011-09-20 ENCOUNTER — Other Ambulatory Visit: Payer: Self-pay

## 2011-09-20 ENCOUNTER — Emergency Department (HOSPITAL_COMMUNITY)
Admission: EM | Admit: 2011-09-20 | Discharge: 2011-09-20 | Disposition: A | Discharge status: Home / Self Care | Payer: Medicare Other | Attending: Emergency Medicine | Admitting: Emergency Medicine

## 2011-09-20 DIAGNOSIS — M545 Low back pain: Secondary | ICD-10-CM | POA: Diagnosis not present

## 2011-09-20 DIAGNOSIS — Z86711 Personal history of pulmonary embolism: Secondary | ICD-10-CM | POA: Diagnosis not present

## 2011-09-20 DIAGNOSIS — E785 Hyperlipidemia, unspecified: Secondary | ICD-10-CM | POA: Insufficient documentation

## 2011-09-20 DIAGNOSIS — R0602 Shortness of breath: Secondary | ICD-10-CM | POA: Diagnosis not present

## 2011-09-20 DIAGNOSIS — Z86718 Personal history of other venous thrombosis and embolism: Secondary | ICD-10-CM | POA: Insufficient documentation

## 2011-09-20 DIAGNOSIS — R918 Other nonspecific abnormal finding of lung field: Secondary | ICD-10-CM | POA: Diagnosis not present

## 2011-09-20 DIAGNOSIS — J8410 Pulmonary fibrosis, unspecified: Secondary | ICD-10-CM | POA: Diagnosis not present

## 2011-09-20 LAB — DIFFERENTIAL
Basophils Relative: 0 % (ref 0–1)
Eosinophils Absolute: 0.2 10*3/uL (ref 0.0–0.7)
Eosinophils Relative: 2 % (ref 0–5)
Monocytes Relative: 9 % (ref 3–12)
Neutrophils Relative %: 44 % (ref 43–77)

## 2011-09-20 LAB — URINALYSIS, ROUTINE W REFLEX MICROSCOPIC
Glucose, UA: NEGATIVE mg/dL
Protein, ur: NEGATIVE mg/dL
pH: 6.5 (ref 5.0–8.0)

## 2011-09-20 LAB — POCT I-STAT, CHEM 8
BUN: 9 mg/dL (ref 6–23)
Chloride: 105 mEq/L (ref 96–112)
Creatinine, Ser: 0.8 mg/dL (ref 0.50–1.10)
Glucose, Bld: 95 mg/dL (ref 70–99)
Hemoglobin: 15 g/dL (ref 12.0–15.0)
Potassium: 4.3 mEq/L (ref 3.5–5.1)

## 2011-09-20 LAB — CBC
Hemoglobin: 14.2 g/dL (ref 12.0–15.0)
MCH: 30.1 pg (ref 26.0–34.0)
MCHC: 33.6 g/dL (ref 30.0–36.0)
MCV: 89.6 fL (ref 78.0–100.0)

## 2011-09-20 LAB — URINE MICROSCOPIC-ADD ON

## 2011-09-20 LAB — CARDIAC PANEL(CRET KIN+CKTOT+MB+TROPI): CK, MB: 4.3 ng/mL — ABNORMAL HIGH (ref 0.3–4.0)

## 2011-09-20 NOTE — ED Notes (Signed)
EKG GIVEN TO DR KNAPP 

## 2011-09-20 NOTE — ED Notes (Signed)
Pt in c/o shortness of breath since last night, increased with exertion, also cough, also c/o back pain that is increased with movement and pain with urination

## 2011-09-20 NOTE — ED Provider Notes (Signed)
Medical screening examination/treatment/procedure(s) were conducted as a shared visit with non-physician practitioner(s) and myself.  I personally evaluated the patient during the encounter  Patient had an episode of shortness of breath last night. Today she denies any dyspnea on exertion. She does not have any shortness of breath at this time. Patient does not have evidence of anemia. I doubt cardiac abnormality. She does have chronic interstitial lung disease but at this time is not having any respiratory difficulty. Patient is being treated for a urinary tract infection it is possible that she had some general malaise associated with that.  At this point there does not appear to be evidence of an acute emergency medical condition, I feel it is safe for her to be discharged home with followup with her primary doctor.  Celene Kras, MD 09/20/11 [phone]

## 2011-09-20 NOTE — ED Provider Notes (Signed)
History     CSN: [id number]  Arrival date & time 09/20/11  1651   First MD Initiated Contact with Patient 09/20/11 2052      Chief Complaint  Patient presents with  . 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  Last night she all of a sudden developed shortness of breath without chest pain, nausea, diaphoresis.  This lasted several minutes and then resolved spontaneously.  This morning she woke up and noticed that she had low back pain and urinary frequency.  She called Dr. Evlyn Kanner, who called in a prescription for her UTI while she was driving to get her prescription she again developed shortness of breath.  She does have a history of pulmonary fibrosis, that is being monitored by Dr. Shelle Iron.  She takes no medication for this at this time.  She has never had chest pain or shortness or breath like this before  Patient is a 78 y.o. female presenting with shortness of breath. The history is provided by the patient.  Shortness of Breath  The current episode started yesterday. The problem occurs occasionally. The problem has been unchanged. The problem is moderate. The symptoms are relieved by rest. The symptoms are aggravated by nothing. Associated symptoms include shortness of breath. Pertinent negatives include no chest pain, no chest pressure, no orthopnea, no fever, no rhinorrhea, no stridor, no cough and no wheezing. Urine output has been normal. The last void occurred less than 6 hours ago.    Past Medical History  Diagnosis Date  . Allergic rhinitis   . History of pulmonary embolism   . DVT (deep venous thrombosis)   . Dyslipidemia   . Pulmonary fibrosis     Past Surgical History  Procedure Date  . Abdominal hysterectomy   . Appendectomy   . Total knee arthroplasty 1997    Family History  Problem Relation Age of Onset  . Heart  disease Father   . Kidney disease Daughter     History  Substance Use Topics  . Smoking status: Former Smoker -- 0.3 packs/day for 20 years    Quit date: 07/11/1973  . Smokeless tobacco: Not on file  . Alcohol Use: Not on file    OB History    Grav Para Term Preterm Abortions TAB SAB Ect Mult Living                  Review of Systems  Constitutional: Negative for fever and chills.  HENT: Negative for rhinorrhea.   Respiratory: Positive for shortness of breath. Negative for cough, wheezing and stridor.   Cardiovascular: Negative for chest pain, palpitations, orthopnea and leg swelling.  Genitourinary: Positive for dysuria and frequency.  Musculoskeletal: Positive for back pain. Negative for myalgias.  Skin: Negative for pallor.  Neurological: Negative for dizziness, weakness and numbness.    Allergies  Niacin and Penicillins  Home Medications   Current Outpatient Rx  Name Route Sig Dispense Refill  . ALPRAZOLAM 0.5 MG PO TABS Oral Take 0.5 mg by mouth as needed. For anxiety    . FLUTICASONE PROPIONATE 50 MCG/ACT NA SUSP Nasal Place 2 sprays into the nose daily.    . MULTIVITAMINS PO CAPS Oral Take 1 capsule by mouth daily.        BP 105/63  Pulse 84  Temp 98 F (36.7 C)  Resp 20  SpO2 98%  Physical Exam  Constitutional: She is oriented to person,  place, and time. She appears well-developed and well-nourished.  Neck: Normal range of motion.  Cardiovascular: Normal rate and regular rhythm.   Pulmonary/Chest: Effort normal and breath sounds normal. No respiratory distress. She has no wheezes. She exhibits no tenderness.  Abdominal: Soft.  Musculoskeletal: Normal range of motion. She exhibits no edema and no tenderness.  Neurological: She is alert and oriented to person, place, and time.  Skin: Skin is warm and dry.    ED Course  Procedures (including critical care time)  Labs Reviewed  URINALYSIS, ROUTINE W REFLEX MICROSCOPIC - Abnormal; Notable for the  following:    Hgb urine dipstick MODERATE (*)    Leukocytes, UA SMALL (*)    All other components within normal limits  DIFFERENTIAL - Abnormal; Notable for the following:    Lymphs Abs 4.1 (*)    All other components within normal limits  CARDIAC PANEL(CRET KIN+CKTOT+MB+TROPI) - Abnormal; Notable for the following:    CK, MB 4.3 (*)    Relative Index 3.1 (*)    All other components within normal limits  URINE MICROSCOPIC-ADD ON - Abnormal; Notable for the following:    Squamous Epithelial / LPF FEW (*)    All other components within normal limits  CBC  POCT I-STAT, CHEM 8   Dg Chest 2 View  09/20/2011  *RADIOLOGY REPORT*  Clinical Data: Shortness of breath.  CHEST - 2 VIEW  Comparison: 10/20/2010  Findings: Heart size is normal.  Coarse parenchymal lung markings are favored to be chronic.  There are no focal consolidations or pleural effusions.  No evidence for pulmonary edema. Visualized osseous structures have a normal appearance.  IMPRESSION: Chronic interstitial lung changes. No focal, acute pulmonary abnormality.  Original Report Authenticated By: Patterson Hammersmith, M.D.     1. Shortness of breath       MDM  Concern for intermittent shortness of breath in a 78 year old relatively healthy woman with a history of pneumonia and pulmonary fibrosis, will obtain cardiac labs, EKG chest x-ray, CBC i-STAT,of course because of the dysuria.  Check a urine        Arman Filter, NP 09/20/11 2343

## 2011-09-20 NOTE — Telephone Encounter (Signed)
Called and spoke with pt and she stated she is "headed out to the door to go to [facility] ED for increased sob."  Informed pt I will forward message to [facility] as an FYI.

## 2011-09-20 NOTE — Discharge Instructions (Signed)
Shortness of Breath Shortness of breath (dyspnea) is the feeling of uneasy breathing. Shortness of breath does not always mean that there is a life-threatening illness. However, shortness of breath requires immediate medical care. CAUSES  Causes for shortness of breath include:  Not enough oxygen in the air (as with high altitudes or with a smoke-filled room).   Short-term (acute) lung disease, including:   Infections such as pneumonia.   Fluid in the lungs, such as heart failure.   A blood clot in the lungs (pulmonary embolism).   Lasting (chronic) lung diseases.   Heart disease (heart attack, angina, heart failure, and others).   Low red blood cells (anemia).   Poor physical fitness. This can cause shortness of breath when you exercise.   Chest or back injuries or stiffness.   Being overweight (obese).   Anxiety. This can make you feel like you are not getting enough air.  DIAGNOSIS  Serious medical problems can usually be found during your physical exam. Many tests may also be done to determine why you are having shortness of breath. Tests include:  Chest X-rays.   Lung function tests.   Blood tests.   Electrocardiography.   Exercise testing.   A cardiac echo.   Imaging scans.  Your caregiver may not be able to find a cause for your shortness of breath after your exam. In this case, it is important to have a follow-up exam with your caregiver as directed.  HOME CARE INSTRUCTIONS   Do not smoke. Smoking is a common cause of shortness of breath. Ask for help to stop smoking.   Avoid being around chemicals that may bother your breathing (paint fumes, dust).   Rest as needed. Slowly resume your usual activities.   If medicines were prescribed, take them as directed for the full length of time directed. This includes oxygen and any inhaled medicines.   Follow up with your caregiver as directed. Waiting to do so or failure to follow up could result in worsening of  your condition and possible disability or death.   Be sure you understand what to do or who to call if your shortness of breath worsens.  SEEK MEDICAL CARE IF:   Your condition does not improve in the time expected.   You have a hard time doing your normal activities even with rest.   You have any side effects or problems with the medicines prescribed.   You develop any new symptoms.  SEEK IMMEDIATE MEDICAL CARE IF:   Your shortness of breath is getting worse.   You feel lightheaded, faint, or develop a cough not controlled with medicines.   You start coughing up blood.   You have pain with breathing.   You have chest pain or pain in your arms, shoulders, or abdomen.   You have a fever.   You are unable to walk up stairs or exercise the way you normally do.   Your symptoms are getting worse.  Document Released: 03/22/2001 Document Revised: 06/16/2011 Document Reviewed: 11/07/2007 [facility] Patient Information 2012 [place], [place]. Tonight.  There was no definitive cause for your shortness of breath.  Identified.  Please make an appointment with Dr. Calton Dach for followup if you again develop shortness of breath that lasts longer than several seconds associated with chest pain, nausea, sweatiness, please return immediately to the emergency room for further evaluation

## 2011-10-12 DIAGNOSIS — H40059 Ocular hypertension, unspecified eye: Secondary | ICD-10-CM | POA: Diagnosis not present

## 2011-10-12 DIAGNOSIS — Z0100 Encounter for examination of eyes and vision without abnormal findings: Secondary | ICD-10-CM | POA: Diagnosis not present

## 2011-10-12 DIAGNOSIS — H2510 Age-related nuclear cataract, unspecified eye: Secondary | ICD-10-CM | POA: Diagnosis not present

## 2011-10-18 DIAGNOSIS — L259 Unspecified contact dermatitis, unspecified cause: Secondary | ICD-10-CM | POA: Diagnosis not present

## 2011-11-07 ENCOUNTER — Ambulatory Visit (INDEPENDENT_AMBULATORY_CARE_PROVIDER_SITE_OTHER): Payer: Medicare Other | Admitting: Pulmonary Disease

## 2011-11-07 ENCOUNTER — Ambulatory Visit (HOSPITAL_COMMUNITY)
Admission: RE | Admit: 2011-11-07 | Discharge: 2011-11-07 | Disposition: A | Discharge status: Home / Self Care | Payer: Medicare Other | Source: Ambulatory Visit | Attending: Pulmonary Disease | Admitting: Pulmonary Disease

## 2011-11-07 ENCOUNTER — Encounter: Payer: Self-pay | Admitting: Pulmonary Disease

## 2011-11-07 DIAGNOSIS — R0989 Other specified symptoms and signs involving the circulatory and respiratory systems: Secondary | ICD-10-CM | POA: Insufficient documentation

## 2011-11-07 DIAGNOSIS — R0600 Dyspnea, unspecified: Secondary | ICD-10-CM

## 2011-11-07 DIAGNOSIS — R0609 Other forms of dyspnea: Secondary | ICD-10-CM

## 2011-11-07 DIAGNOSIS — M79609 Pain in unspecified limb: Secondary | ICD-10-CM | POA: Diagnosis not present

## 2011-11-07 DIAGNOSIS — J84111 Idiopathic interstitial pneumonia, not otherwise specified: Secondary | ICD-10-CM

## 2011-11-07 NOTE — Assessment & Plan Note (Addendum)
First noted HRCT 2003:  Evaluated at [facility] and [facility].  No bx done.  +subpleural honeycombing Neg.  autoimmune panel Last CT 2008 stable PFT's 2011:  No obstruction, TLC 69%, DLCO 48% (some worsening from 2005, no change from 2009) Seen at [facility] 2012:  Autoimmune panel negative except RF 116, ANA + but only at 1:40, ESR  Only 31, CT chest with changes c/w UIP, echo normal, PFT's with no obstruction/TLC 61%/DLCO 52%, 339 meters.  The patient has a history of interstitial lung disease with a pattern that is most consistent with UIP on CT chest.  Her autoimmune workup in the past has been unrevealing, but a recent reevaluation at [facility] revealed a very elevated rheumatoid factor.  Her PFTs also showed a decline in total lung capacity, but her DLCO was stable to improved.  At this point, I think she needs to have a rheumatology evaluation, since this may make a big difference how we treat her underlying lung disease.

## 2011-11-07 NOTE — Patient Instructions (Signed)
Will send for ultrasound of your legs to make sure no blood clots present Will do scan of lungs looking for blood clots that may explain your shortness of breath episode last month Will send to rheumatology for evaluation of abnormal bloodwork.  Will arrange followup once I get results of above.

## 2011-11-07 NOTE — Progress Notes (Signed)
*  PRELIMINARY RESULTS* Vascular Ultrasound Bilateral lower extremity venous duplex has been completed.   No evidence of lower extremity deep vein thrombosis bilaterally. All veins compressible.  Malachy Moan, RDMS, RDCS 11/07/2011, 3:01 PM

## 2011-11-07 NOTE — Progress Notes (Signed)
  Subjective:    Patient ID: [name] [name], female    DOB: [birth year], 78 y.o.   MRN: [id number]  HPI Patient comes in today for followup of her known interstitial lung disease, probably secondary to usual interstitial pneumonitis.  She had an autoimmune workup done at [facility] in the past that was unremarkable.  She had also been seen at [facility] in the past as well.  She recently went back to do this summer for a reevaluation, and was found to have a very elevated rheumatoid factor.  She also had pulmonary function studies that showed a small decline in total lung capacity and diffusion capacity.  She has not been back for her followup.  She did have an echocardiogram as well that showed no pulmonary hypertension or cardiac dysfunction.  She had been maintaining her usual baseline until last month, when she had sudden worsening of her breathing.  She went to the emergency room where a chest x-ray showed no change in her underlying interstitial disease.  It is really unclear from the notes whether anything else was done, and the patient states she ultimately improved and was sent home.  He did not do a workup for possible PE.  She does have a history of DVT and PE.  She currently feels that she is back to her usual baseline.   Review of Systems  Constitutional: Negative for fever and unexpected weight change.  HENT: Positive for congestion, sore throat, rhinorrhea, sneezing, trouble swallowing, postnasal drip and sinus pressure. Negative for ear pain, nosebleeds and dental problem.   Eyes: Positive for itching. Negative for redness.  Respiratory: Positive for cough and wheezing. Negative for chest tightness and shortness of breath.   Cardiovascular: Positive for leg swelling. Negative for palpitations.  Gastrointestinal: Negative for nausea and vomiting.  Genitourinary: Negative for dysuria.  Musculoskeletal: Negative for joint swelling.  Skin: Negative for rash.  Neurological: Positive for headaches.    Hematological: Does not bruise/bleed easily.  Psychiatric/Behavioral: Negative for dysphoric mood. The patient is not nervous/anxious.        Objective:   Physical Exam Well-developed female in no acute distress Nose without purulence or discharge noted Chest with basilar crackles one third the way up bilaterally, no wheezing Cardiac exam with regular rate and rhythm Lower extremities with ankle edema and varicosities noted, no cyanosis Alert and oriented, moves all 4 extremities.       Assessment & Plan:

## 2011-11-07 NOTE — Assessment & Plan Note (Signed)
The patient had an episode of sudden increased shortness of breath last month, and went to the emergency room for evaluation.  They did not find an etiology for her symptoms, however did not rule out recurrent thromboembolic disease.  Since she does have a history of DVT and PE, I think this needs to be done.  Will schedule for a ventilation perfusion scan as well as lower extremity venous Dopplers.

## 2011-11-08 ENCOUNTER — Encounter (HOSPITAL_COMMUNITY)
Admission: RE | Admit: 2011-11-08 | Discharge: 2011-11-08 | Disposition: A | Discharge status: Home / Self Care | Payer: Medicare Other | Source: Ambulatory Visit | Attending: Pulmonary Disease | Admitting: Pulmonary Disease

## 2011-11-08 DIAGNOSIS — J4489 Other specified chronic obstructive pulmonary disease: Secondary | ICD-10-CM | POA: Insufficient documentation

## 2011-11-08 DIAGNOSIS — R0602 Shortness of breath: Secondary | ICD-10-CM | POA: Insufficient documentation

## 2011-11-08 DIAGNOSIS — R05 Cough: Secondary | ICD-10-CM | POA: Insufficient documentation

## 2011-11-08 DIAGNOSIS — J8410 Pulmonary fibrosis, unspecified: Secondary | ICD-10-CM | POA: Diagnosis not present

## 2011-11-08 DIAGNOSIS — R059 Cough, unspecified: Secondary | ICD-10-CM | POA: Diagnosis not present

## 2011-11-08 DIAGNOSIS — R0989 Other specified symptoms and signs involving the circulatory and respiratory systems: Secondary | ICD-10-CM | POA: Insufficient documentation

## 2011-11-08 DIAGNOSIS — R0600 Dyspnea, unspecified: Secondary | ICD-10-CM

## 2011-11-08 DIAGNOSIS — J449 Chronic obstructive pulmonary disease, unspecified: Secondary | ICD-10-CM | POA: Insufficient documentation

## 2011-11-08 MED ORDER — XENON XE 133 GAS
17.2000 | GAS_FOR_INHALATION | Freq: Once | RESPIRATORY_TRACT | Status: AC | PRN
  Administered 2011-11-08: 17 via RESPIRATORY_TRACT

## 2011-11-08 MED ORDER — TECHNETIUM TO 99M ALBUMIN AGGREGATED
3.2000 | Freq: Once | INTRAVENOUS | Status: AC | PRN
  Administered 2011-11-08: 3 via INTRAVENOUS

## 2011-11-29 DIAGNOSIS — R6889 Other general symptoms and signs: Secondary | ICD-10-CM | POA: Diagnosis not present

## 2011-12-01 DIAGNOSIS — R82998 Other abnormal findings in urine: Secondary | ICD-10-CM | POA: Diagnosis not present

## 2011-12-01 DIAGNOSIS — R300 Dysuria: Secondary | ICD-10-CM | POA: Diagnosis not present

## 2012-01-19 DIAGNOSIS — R82998 Other abnormal findings in urine: Secondary | ICD-10-CM | POA: Diagnosis not present

## 2012-01-19 DIAGNOSIS — R300 Dysuria: Secondary | ICD-10-CM | POA: Diagnosis not present

## 2012-01-31 DIAGNOSIS — N3020 Other chronic cystitis without hematuria: Secondary | ICD-10-CM | POA: Diagnosis not present

## 2012-01-31 DIAGNOSIS — R3129 Other microscopic hematuria: Secondary | ICD-10-CM | POA: Diagnosis not present

## 2012-02-14 DIAGNOSIS — R6889 Other general symptoms and signs: Secondary | ICD-10-CM | POA: Diagnosis not present

## 2012-02-14 DIAGNOSIS — M79609 Pain in unspecified limb: Secondary | ICD-10-CM | POA: Diagnosis not present

## 2012-02-27 ENCOUNTER — Encounter: Payer: Self-pay | Admitting: Cardiovascular Disease

## 2012-02-27 ENCOUNTER — Ambulatory Visit (INDEPENDENT_AMBULATORY_CARE_PROVIDER_SITE_OTHER): Payer: Medicare Other | Admitting: Cardiovascular Disease

## 2012-02-27 VITALS — BP 108/76 | HR 90 | Ht 64.0 in | Wt 161.8 lb

## 2012-02-27 DIAGNOSIS — R079 Chest pain, unspecified: Secondary | ICD-10-CM | POA: Diagnosis not present

## 2012-02-27 DIAGNOSIS — I2510 Atherosclerotic heart disease of native coronary artery without angina pectoris: Secondary | ICD-10-CM | POA: Insufficient documentation

## 2012-02-27 MED ORDER — ASPIRIN EC 81 MG PO TBEC: 81.0000 mg | DELAYED_RELEASE_TABLET | Freq: Every day | ORAL | Status: AC

## 2012-02-27 NOTE — Patient Instructions (Addendum)
Your physician recommends that you schedule a follow-up appointment in:  3-4 weeks.   Your physician has requested that you have an echocardiogram. Echocardiography is a painless test that uses sound waves to create images of your heart. It provides your doctor with information about the size and shape of your heart and how well your heart's chambers and valves are working. This procedure takes approximately one hour. There are no restrictions for this procedure.   Your physician has requested that you have an exercise stress myoview. For further information please visit [email or web address] Please follow instruction sheet, as given.  Your physician has recommended you make the following change in your medication:  Start aspirin 81 mg by mouth daily

## 2012-02-27 NOTE — Assessment & Plan Note (Signed)
She is known to have moderate CAD by cath in 2008. She has not been on a beta blocker or statin. She is intolerant to statins. She is asked to start taking ASA 81 mg po Qdaily.

## 2012-02-27 NOTE — Progress Notes (Signed)
History of Present Illness: 78 yo WF with history of DVT/PE, HLD, pulmonary fibrosis who is here today as a new patient for evaluation of chest pains and pain between her shoulder blades. This has been present for 2 months. This occurs at rest and with exertion. The pains in her chest are sharp. The pain over her upper back is dull. The pain is associated with SOB. The pain occurs once per week. She underwent cardiac catheterization on December 19, 2006 which revealed moderate nonobstructive coronary artery disease with normal LV function and normal pulmonary pressures. (serial 30% LAD and Circumflex lesions, 40% RCA stenosis). Also has some LE edema which is unchanged over last few years. She has not tolerated statins in the past.   Primary Care Physician: Adrian Prince  Last Lipid Profile: 08/02/11: Total chol: 269  HDL: 50  LDL: 184  Past Medical History  Diagnosis Date  . Allergic rhinitis   . History of pulmonary embolism   . DVT (deep venous thrombosis)   . Dyslipidemia   . Pulmonary fibrosis   . DJD (degenerative joint disease)   . Osteoporosis   . CAD (coronary artery disease)     Mild CAD by cath 2008    Past Surgical History  Procedure Date  . Abdominal hysterectomy   . Appendectomy   . Total knee arthroplasty 1997    Bilateral  . Tonsillectomy     Current Outpatient Prescriptions  Medication Sig Dispense Refill  . ALPRAZolam (XANAX) 0.5 MG tablet Take 0.5 mg by mouth as needed. For anxiety      . fluticasone (FLONASE) 50 MCG/ACT nasal spray Place 2 sprays into the nose daily.      . Multiple Vitamin (MULTIVITAMIN) capsule Take 1 capsule by mouth daily.        [name] aspirin EC 81 MG tablet Take 1 tablet (81 mg total) by mouth daily.  30 tablet  0    Allergies  Allergen Reactions  . Niacin Hives  . Penicillins Hives    History   Social History  . Marital Status: Widowed    Spouse Name: N/A    Number of Children: 2  . Years of Education: N/A   Occupational History   . 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 Smoking status: Former Smoker -- 0.3 packs/day for 20 years    Types: Cigarettes    Quit date: 07/11/1973  . Smokeless tobacco: Not on file  . Alcohol Use: 0.5 oz/week    1 drink(s) per week  . Drug Use: No  . Sexually Active: Not on file   Other Topics Concern  . Not on file   Social History Narrative   Widowed.2 children: 1 boy and 1 girlAlcohol use- yes 2/monthDaily caffeine use ---3/weekIllicit drug use---no    Family History  Problem Relation Age of Onset  . Kidney disease Daughter 5  . Heart attack Father 78    Review of Systems:  As stated in the HPI and otherwise negative.   BP 108/76  Pulse 90  Ht 5\' 4"  (1.626 m)  Wt 161 lb 12.8 oz (73.392 kg)  BMI 27.77 kg/m2  Physical Examination: General: Well developed, well nourished, NAD HEENT: OP clear, mucus membranes moist SKIN: warm, dry. No rashes. Neuro: No focal deficits Musculoskeletal: Muscle strength 5/5 all ext Psychiatric: Mood and affect normal Neck: No JVD, no carotid bruits, no thyromegaly, no lymphadenopathy. Lungs:Clear bilaterally, no wheezes, rhonci, crackles Cardiovascular:  Regular rate and rhythm. No murmurs, gallops or rubs. Abdomen:Soft. Bowel sounds present. Non-tender.  Extremities: No lower extremity edema. Pulses are 2 + in the bilateral DP/PT.  EKG: NSR, rate 86 bpm. LAE.   Cardiac cath 12/19/06:  Left main was normal.  The left anterior descending is a long vessel that wrapped the apex. It  gave off two tiny diagonals and a large third diagonal. There is a 30%  lesion in the mid LAD spanning the takeoff of the third diagonal. There  was a 30% proximal lesion in the third diagonal.  Left circumflex was a moderate-sized vessel, gave off a tiny OM-1 as  well as a small OM-2 and OM-3. There was a moderate sized OM-4. There  were tandem 30% lesions in the mid left circumflex.  Right coronary artery was  moderate-sized dominant vessel. Gave off a  small RV branch, normal size PDA and a small posterolateral. There is a  40% lesion in the mid section of the RCA.  Left ventriculogram done in the RAO position showed EF of 50-65% with no  focal wall motion abnormalities.

## 2012-02-27 NOTE — Assessment & Plan Note (Signed)
Chest pain in pt with known CAD with last cath 2008 with 30 and 40% lesions in all vessels. She has hyperlipidemia. Will arrange exercise myoview to exclude ischemia. I think imaging is indicated with the stress test given her known CAD. Will also arrange echo to exclude structural heart disease with c/o chest pain and dyspnea/LE edema.

## 2012-03-05 ENCOUNTER — Ambulatory Visit (HOSPITAL_COMMUNITY): Payer: Medicare Other | Attending: Internal Medicine | Admitting: Radiology

## 2012-03-05 VITALS — BP 106/56 | Ht 64.5 in | Wt 157.0 lb

## 2012-03-05 DIAGNOSIS — R0602 Shortness of breath: Secondary | ICD-10-CM

## 2012-03-05 DIAGNOSIS — I2510 Atherosclerotic heart disease of native coronary artery without angina pectoris: Secondary | ICD-10-CM | POA: Diagnosis not present

## 2012-03-05 DIAGNOSIS — R0989 Other specified symptoms and signs involving the circulatory and respiratory systems: Secondary | ICD-10-CM | POA: Insufficient documentation

## 2012-03-05 DIAGNOSIS — R079 Chest pain, unspecified: Secondary | ICD-10-CM | POA: Insufficient documentation

## 2012-03-05 DIAGNOSIS — R0609 Other forms of dyspnea: Secondary | ICD-10-CM | POA: Insufficient documentation

## 2012-03-05 DIAGNOSIS — Z87891 Personal history of nicotine dependence: Secondary | ICD-10-CM | POA: Insufficient documentation

## 2012-03-05 MED ORDER — TECHNETIUM TC 99M TETROFOSMIN IV KIT
10.0000 | PACK | Freq: Once | INTRAVENOUS | Status: AC | PRN
  Administered 2012-03-05: 10 via INTRAVENOUS

## 2012-03-05 MED ORDER — TECHNETIUM TC 99M TETROFOSMIN IV KIT
30.0000 | PACK | Freq: Once | INTRAVENOUS | Status: AC | PRN
  Administered 2012-03-05: 30 via INTRAVENOUS

## 2012-03-05 NOTE — Progress Notes (Signed)
[facility] SITE 3 NUCLEAR MED [address] [place] [address] [phone]  Cardiology Nuclear Med Study  [name] [name] is a 78 y.o. female     MRN : [id number]     DOB: [birth year]  Procedure Date: 03/05/2012  Nuclear Med Background Indication for Stress Test:  Evaluation for Ischemia History:  '08 Heart Catheterization: moderate nonobstructive CAD,EF=65%: GXT approx 15 yrs ago,normal per patient;Pulmonary Fibrosis  Cardiac Risk Factors: Family History - CAD, History of Smoking and Lipids  Symptoms:  Chest Pain at rest and with Exertion (last date of chest discomfort 2 days ago), DOE, Fatigue and SOB   Nuclear Pre-Procedure Caffeine/Decaff Intake:  None NPO After: 8:30pm   Lungs: rhonchi/Albuterol 2 puffs given prior to exercise. Lungs clear after treatment. O2 Sat: 98% on room air. IV 0.9% NS with Angio Cath:  22g  IV Site: L Antecubital  IV Started by:  Stanton Kidney, EMT-P  Chest Size (in):  40 Cup Size: DD  Height: 5' 4.5" (1.638 m)  Weight:  157 lb (71.215 kg)  BMI:  Body mass index is 26.53 kg/(m^2). Tech Comments:  NA    Nuclear Med Study 1 or 2 day study: 1 day  Stress Test Type:  Stress  Reading MD: Dietrich Pates, MD  Order Authorizing Provider:  Tedra Senegal  Resting Radionuclide: Technetium 1m Tetrofosmin  Resting Radionuclide Dose: 11.0 mCi   Stress Radionuclide:  Technetium 94m Tetrofosmin  Stress Radionuclide Dose: 32.9 mCi           Stress Protocol Rest HR: 74 Stress HR: 139  Rest BP: 106/56 Stress BP: 176/70  Exercise Time (min): 4:00 METS: 4.00   Predicted Max HR: 150 bpm % Max HR: 92.67 bpm Rate Pressure Product: 41324   Dose of Adenosine (mg):  n/a Dose of Lexiscan: n/a mg  Dose of Atropine (mg): n/a Dose of Dobutamine: n/a mcg/kg/min (at max HR)  Stress Test Technologist: Cathlyn Parsons, RN  Nuclear Technologist:  Domenic Polite, CNMT     Rest Procedure:  Myocardial perfusion imaging was performed at rest 45  minutes following the intravenous administration of Technetium 65m Tetrofosmin. Rest ECG: Sinus Rhythm  Stress Procedure:  The patient performed treadmill exercise using a Bruce  Protocol for  minutes. The patient stopped due to SOB and denied any chest pain.  There were no significant ST-T wave changes.  Technetium 63m Tetrofosmin was injected at peak exercise and myocardial perfusion imaging was performed after a brief delay. Stress ECG: No significant change from baseline ECG  QPS Raw Data Images:  Images were motion corrected.  Soft tissue (diaphragm, breast) surround heart. Stress Images:  Normal homogeneous uptake in all areas of the myocardium. Rest Images:  Normal homogeneous uptake in all areas of the myocardium. Subtraction (SDS):  No evidence of ischemia. Transient Ischemic Dilatation (Normal <1.22):  *0.76** Lung/Heart Ratio (Normal <0.45):  0.42  Quantitative Gated Spect Images QGS EDV:  42 ml QGS ESV:  6 ml  Impression Exercise Capacity:  Poor exercise capacity. BP Response:  Normal blood pressure response. Clinical Symptoms:  No chest pain. ECG Impression:  No significant ST segment change suggestive of ischemia. Comparison with Prior Nuclear Study: No images to compare  Overall Impression:  Normal stress nuclear study.  LV Ejection Fraction: 85%.  LV Wall Motion:  NL LV Function; NL Wall Motion

## 2012-03-06 ENCOUNTER — Ambulatory Visit (HOSPITAL_COMMUNITY): Payer: Medicare Other | Attending: Cardiology

## 2012-03-06 DIAGNOSIS — Z87891 Personal history of nicotine dependence: Secondary | ICD-10-CM | POA: Diagnosis not present

## 2012-03-06 DIAGNOSIS — R072 Precordial pain: Secondary | ICD-10-CM

## 2012-03-06 DIAGNOSIS — E785 Hyperlipidemia, unspecified: Secondary | ICD-10-CM | POA: Diagnosis not present

## 2012-03-06 DIAGNOSIS — I379 Nonrheumatic pulmonary valve disorder, unspecified: Secondary | ICD-10-CM | POA: Diagnosis not present

## 2012-03-06 DIAGNOSIS — R079 Chest pain, unspecified: Secondary | ICD-10-CM | POA: Diagnosis not present

## 2012-03-06 DIAGNOSIS — I2510 Atherosclerotic heart disease of native coronary artery without angina pectoris: Secondary | ICD-10-CM | POA: Insufficient documentation

## 2012-03-06 DIAGNOSIS — I079 Rheumatic tricuspid valve disease, unspecified: Secondary | ICD-10-CM | POA: Diagnosis not present

## 2012-03-06 NOTE — Progress Notes (Signed)
Echocardiogram performed.  

## 2012-03-07 ENCOUNTER — Telehealth: Payer: Self-pay | Admitting: Cardiovascular Disease

## 2012-03-07 NOTE — Telephone Encounter (Signed)
Spoke with pt and reviewed myoview and echo results with her.

## 2012-03-07 NOTE — Telephone Encounter (Signed)
Patient returning nurse call, she can be reached at hm#  °

## 2012-03-13 DIAGNOSIS — J8410 Pulmonary fibrosis, unspecified: Secondary | ICD-10-CM | POA: Diagnosis not present

## 2012-03-23 ENCOUNTER — Ambulatory Visit (INDEPENDENT_AMBULATORY_CARE_PROVIDER_SITE_OTHER): Payer: Medicare Other | Admitting: Cardiovascular Disease

## 2012-03-23 ENCOUNTER — Encounter: Payer: Self-pay | Admitting: Cardiovascular Disease

## 2012-03-23 VITALS — BP 118/70 | HR 80

## 2012-03-23 DIAGNOSIS — I25810 Atherosclerosis of coronary artery bypass graft(s) without angina pectoris: Secondary | ICD-10-CM

## 2012-03-23 MED ORDER — NITROGLYCERIN 0.4 MG SL SUBL: 0.4000 mg | SUBLINGUAL_TABLET | SUBLINGUAL | Status: DC | PRN

## 2012-03-23 NOTE — Patient Instructions (Signed)
Your physician wants you to follow-up in:  12 months.  You will receive a reminder letter in the mail two months in advance. If you don't receive a letter, please call our office to schedule the follow-up appointment.   

## 2012-03-23 NOTE — Progress Notes (Signed)
History of Present Illness: 78 yo WF with history of DVT/PE, HLD, pulmonary fibrosis who is here today for cardiac follow up. I saw her 02/27/12 as a new patient for evaluation of chest pains and pain between her shoulder blades. This has been present for 2 months. This occurs at rest and with exertion. The pains in her chest are sharp. The pain over her upper back is dull. The pain is associated with SOB. The pain occurs once per week. She underwent cardiac catheterization on December 19, 2006 which revealed moderate nonobstructive coronary artery disease with normal LV function and normal pulmonary pressures. (serial 30% LAD and Circumflex lesions, 40% RCA stenosis). Also has some LE edema which is unchanged over last few years. She has not tolerated statins in the past. I arranged an exercise stress myoview on 03/05/12 which showed normal LV function and no evidence of ischemia. Echo 03/06/12 with normal LV size and function with no significant valvular abnormalities.   She is here today for f/u. She has had no chest pain. Her breathing is stable.   Primary Care Physician: Adrian Prince   Last Lipid Profile: 08/02/11: Total chol: 269 HDL: 50 LDL: 184  Past Medical History  Diagnosis Date  . Allergic rhinitis   . History of pulmonary embolism   . DVT (deep venous thrombosis)   . Dyslipidemia   . Pulmonary fibrosis   . DJD (degenerative joint disease)   . Osteoporosis   . CAD (coronary artery disease)     Mild CAD by cath 2008    Past Surgical History  Procedure Date  . Abdominal hysterectomy   . Appendectomy   . Total knee arthroplasty 1997    Bilateral  . Tonsillectomy     Current Outpatient Prescriptions  Medication Sig Dispense Refill  . ALPRAZolam (XANAX) 0.5 MG tablet Take 0.5 mg by mouth as needed. For anxiety      . aspirin EC 81 MG tablet Take 1 tablet (81 mg total) by mouth daily.  30 tablet  0  . fluticasone (FLONASE) 50 MCG/ACT nasal spray Place 2 sprays into the nose  daily.      . Multiple Vitamin (MULTIVITAMIN) capsule Take 1 capsule by mouth daily.          Allergies  Allergen Reactions  . Niacin Hives  . Penicillins Hives    History   Social History  . Marital Status: Widowed    Spouse Name: N/A    Number of Children: 2  . Years of Education: N/A   Occupational History  . 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 Smoking status: Former Smoker -- 0.3 packs/day for 20 years    Types: Cigarettes    Quit date: 07/11/1973  . Smokeless tobacco: Not on file  . Alcohol Use: 0.5 oz/week    1 drink(s) per week  . Drug Use: No  . Sexually Active: Not on file   Other Topics Concern  . Not on file   Social History Narrative   Widowed.2 children: 1 boy and 1 girlAlcohol use- yes 2/monthDaily caffeine use ---3/weekIllicit drug use---no    Family History  Problem Relation Age of Onset  . Kidney disease Daughter 5  . Heart attack Father 60    Review of Systems:  As stated in the HPI and otherwise negative.   BP 118/70  Pulse 80  Physical Examination: General: Well developed, well nourished, NAD HEENT: OP clear, mucus membranes  moist SKIN: warm, dry. No rashes. Neuro: No focal deficits Musculoskeletal: Muscle strength 5/5 all ext Psychiatric: Mood and affect normal Neck: No JVD, no carotid bruits, no thyromegaly, no lymphadenopathy. Lungs:Clear bilaterally, no wheezes, rhonci, crackles Cardiovascular: Regular rate and rhythm. No murmurs, gallops or rubs. Abdomen:Soft. Bowel sounds present. Non-tender.  Extremities: No lower extremity edema. Pulses are 2 + in the bilateral DP/PT.  Exercise Stress Myoview: 03/05/12:  Stress Procedure: The patient performed treadmill exercise using a Bruce Protocol for minutes. The patient stopped due to SOB and denied any chest pain. There were no significant ST-T wave changes. Technetium 47m Tetrofosmin was injected at peak exercise and myocardial perfusion imaging  was performed after a brief delay.  Stress ECG: No significant change from baseline ECG  QPS  Raw Data Images: Images were motion corrected. Soft tissue (diaphragm, breast) surround heart.  Stress Images: Normal homogeneous uptake in all areas of the myocardium.  Rest Images: Normal homogeneous uptake in all areas of the myocardium.  Subtraction (SDS): No evidence of ischemia.  Transient Ischemic Dilatation (Normal <1.22): *0.76**  Lung/Heart Ratio (Normal <0.45): 0.42  Quantitative Gated Spect Images  QGS EDV: 42 ml  QGS ESV: 6 ml  Impression  Exercise Capacity: Poor exercise capacity.  BP Response: Normal blood pressure response.  Clinical Symptoms: No chest pain.  ECG Impression: No significant ST segment change suggestive of ischemia.  Comparison with Prior Nuclear Study: No images to compare  Overall Impression: Normal stress nuclear study.  Echo 03/06/12:  Left ventricle: The cavity size was normal. Wall thickness was normal. Systolic function was normal. The estimated ejection fraction was in the range of 55% to 60%. Wall motion was normal; there were no regional wall motion abnormalities. Doppler parameters are consistent with abnormal left ventricular relaxation (grade 1 diastolic dysfunction). - Aortic valve: There was no stenosis. - Mitral valve: No significant regurgitation. - Right ventricle: The cavity size was normal. Systolic function was mildly reduced. - Pulmonary arteries: PA peak pressure: 26mm Hg (S). - Inferior vena cava: The vessel was normal in size; the respirophasic diameter changes were in the normal range (= 50%); findings are consistent with normal central venous pressure. Impressions:  - Normal LV size and systolic function, EF 55-60%. Normal RV size with mildly decreased systolic function. Doppler interrogation of TR jet does not suggest pulmonary hypertension. No significant valvular abnormalities.  Assessment and Plan:   1. CAD: She is  known to have moderate CAD by cath in 2008. Stress test without ischemia. She has not been on a beta blocker or statin. She is intolerant to statins. She will continue taking ASA 81 mg po Qdaily. She does not wish to start a beta blocker. BP{ is within normal range.

## 2012-04-16 DIAGNOSIS — K5900 Constipation, unspecified: Secondary | ICD-10-CM | POA: Diagnosis not present

## 2012-06-04 ENCOUNTER — Other Ambulatory Visit: Payer: Self-pay | Admitting: Endocrinology

## 2012-06-04 DIAGNOSIS — Z1231 Encounter for screening mammogram for malignant neoplasm of breast: Secondary | ICD-10-CM

## 2012-06-11 DIAGNOSIS — M545 Low back pain: Secondary | ICD-10-CM | POA: Diagnosis not present

## 2012-06-12 DIAGNOSIS — M545 Low back pain: Secondary | ICD-10-CM | POA: Diagnosis not present

## 2012-06-12 DIAGNOSIS — H40059 Ocular hypertension, unspecified eye: Secondary | ICD-10-CM | POA: Diagnosis not present

## 2012-06-25 DIAGNOSIS — J849 Interstitial pulmonary disease, unspecified: Secondary | ICD-10-CM | POA: Insufficient documentation

## 2012-07-19 DIAGNOSIS — Z23 Encounter for immunization: Secondary | ICD-10-CM | POA: Diagnosis not present

## 2012-08-10 ENCOUNTER — Ambulatory Visit
Admission: RE | Admit: 2012-08-10 | Discharge: 2012-08-10 | Disposition: A | Discharge status: Home / Self Care | Payer: Medicare Other | Source: Ambulatory Visit | Attending: Endocrinology | Admitting: Endocrinology

## 2012-08-10 DIAGNOSIS — Z1231 Encounter for screening mammogram for malignant neoplasm of breast: Secondary | ICD-10-CM

## 2012-08-14 ENCOUNTER — Other Ambulatory Visit: Payer: Self-pay | Admitting: Endocrinology

## 2012-08-14 DIAGNOSIS — R928 Other abnormal and inconclusive findings on diagnostic imaging of breast: Secondary | ICD-10-CM

## 2012-08-15 DIAGNOSIS — M779 Enthesopathy, unspecified: Secondary | ICD-10-CM | POA: Diagnosis not present

## 2012-08-15 DIAGNOSIS — M79609 Pain in unspecified limb: Secondary | ICD-10-CM | POA: Diagnosis not present

## 2012-08-15 DIAGNOSIS — M2010 Hallux valgus (acquired), unspecified foot: Secondary | ICD-10-CM | POA: Diagnosis not present

## 2012-08-20 ENCOUNTER — Ambulatory Visit
Admission: RE | Admit: 2012-08-20 | Discharge: 2012-08-20 | Disposition: A | Discharge status: Home / Self Care | Payer: Medicare Other | Source: Ambulatory Visit | Attending: Endocrinology | Admitting: Endocrinology

## 2012-08-20 DIAGNOSIS — R928 Other abnormal and inconclusive findings on diagnostic imaging of breast: Secondary | ICD-10-CM

## 2012-08-20 DIAGNOSIS — N6019 Diffuse cystic mastopathy of unspecified breast: Secondary | ICD-10-CM | POA: Diagnosis not present

## 2012-09-11 DIAGNOSIS — H40059 Ocular hypertension, unspecified eye: Secondary | ICD-10-CM | POA: Diagnosis not present

## 2012-09-26 ENCOUNTER — Other Ambulatory Visit: Payer: Self-pay | Admitting: Nurse Practitioner

## 2012-09-26 ENCOUNTER — Telehealth: Payer: Self-pay | Admitting: Obstetrics and Gynecology

## 2012-09-26 ENCOUNTER — Ambulatory Visit: Payer: Self-pay | Admitting: Obstetrics and Gynecology

## 2012-09-26 NOTE — Telephone Encounter (Signed)
Due to provider issue, appointment canceled for today and Rx for Valtrex sent to [facility].  Last annual 05-2011 will call patient tomorrow to reschedule as she is driving.

## 2012-10-02 DIAGNOSIS — R300 Dysuria: Secondary | ICD-10-CM | POA: Diagnosis not present

## 2012-10-02 DIAGNOSIS — N3020 Other chronic cystitis without hematuria: Secondary | ICD-10-CM | POA: Diagnosis not present

## 2012-10-16 DIAGNOSIS — N3020 Other chronic cystitis without hematuria: Secondary | ICD-10-CM | POA: Diagnosis not present

## 2012-10-29 ENCOUNTER — Ambulatory Visit (HOSPITAL_COMMUNITY)
Admission: RE | Admit: 2012-10-29 | Discharge: 2012-10-29 | Disposition: A | Discharge status: Home / Self Care | Payer: Medicare Other | Source: Ambulatory Visit | Attending: Family Medicine | Admitting: Family Medicine

## 2012-10-29 ENCOUNTER — Encounter: Payer: Self-pay | Admitting: Family Medicine

## 2012-10-29 ENCOUNTER — Ambulatory Visit (INDEPENDENT_AMBULATORY_CARE_PROVIDER_SITE_OTHER): Payer: Medicare Other | Admitting: Family Medicine

## 2012-10-29 VITALS — BP 98/62 | HR 88 | Temp 98.1°F | Resp 18 | Ht 64.0 in | Wt 159.0 lb

## 2012-10-29 DIAGNOSIS — R300 Dysuria: Secondary | ICD-10-CM | POA: Diagnosis not present

## 2012-10-29 DIAGNOSIS — M79609 Pain in unspecified limb: Secondary | ICD-10-CM | POA: Diagnosis not present

## 2012-10-29 DIAGNOSIS — M25561 Pain in right knee: Secondary | ICD-10-CM

## 2012-10-29 DIAGNOSIS — R42 Dizziness and giddiness: Secondary | ICD-10-CM

## 2012-10-29 DIAGNOSIS — Z86718 Personal history of other venous thrombosis and embolism: Secondary | ICD-10-CM | POA: Insufficient documentation

## 2012-10-29 DIAGNOSIS — E785 Hyperlipidemia, unspecified: Secondary | ICD-10-CM | POA: Diagnosis not present

## 2012-10-29 DIAGNOSIS — M7989 Other specified soft tissue disorders: Secondary | ICD-10-CM | POA: Diagnosis not present

## 2012-10-29 DIAGNOSIS — Z86711 Personal history of pulmonary embolism: Secondary | ICD-10-CM | POA: Insufficient documentation

## 2012-10-29 DIAGNOSIS — M25569 Pain in unspecified knee: Secondary | ICD-10-CM | POA: Diagnosis not present

## 2012-10-29 LAB — POCT URINALYSIS DIPSTICK
Glucose, UA: NEGATIVE
Leukocytes, UA: NEGATIVE
Nitrite, UA: NEGATIVE
Spec Grav, UA: 1.025
Urobilinogen, UA: 1
pH, UA: 6

## 2012-10-29 LAB — POCT CBC
Granulocyte percent: 52.2 % (ref 37–80)
HCT, POC: 45.6 % (ref 37.7–47.9)
Hemoglobin: 14.6 g/dL (ref 12.2–16.2)
Lymph, poc: 4.2 — AB (ref 0.6–3.4)
MCH, POC: 29.6 pg (ref 27–31.2)
MCHC: 32 g/dL (ref 31.8–35.4)
MCV: 92.4 fL (ref 80–97)
MID (cbc): 1 — AB (ref 0–0.9)
MPV: 9.1 fL (ref 0–99.8)
POC Granulocyte: 5.7 (ref 2–6.9)
POC LYMPH PERCENT: 38.6 %L (ref 10–50)
POC MID %: 9.2 % (ref 0–12)
Platelet Count, POC: 243 10*3/uL (ref 142–424)
RBC: 4.94 M/uL (ref 4.04–5.48)
RDW, POC: 14.6 %
WBC: 11 10*3/uL — AB (ref 4.6–10.2)

## 2012-10-29 LAB — COMPREHENSIVE METABOLIC PANEL
ALT: 15 U/L (ref 0–35)
BUN: 13 mg/dL (ref 6–23)
CO2: 29 mEq/L (ref 19–32)
Calcium: 9.7 mg/dL (ref 8.4–10.5)
Chloride: 103 mEq/L (ref 96–112)
Creat: 0.86 mg/dL (ref 0.50–1.10)
Total Bilirubin: 0.5 mg/dL (ref 0.3–1.2)

## 2012-10-29 LAB — POCT UA - MICROSCOPIC ONLY
Bacteria, U Microscopic: NEGATIVE
Casts, Ur, LPF, POC: NEGATIVE
Crystals, Ur, HPF, POC: NEGATIVE
Yeast, UA: NEGATIVE

## 2012-10-29 LAB — COMPREHENSIVE METABOLIC PANEL WITH GFR
AST: 23 U/L (ref 0–37)
Albumin: 4.1 g/dL (ref 3.5–5.2)
Alkaline Phosphatase: 83 U/L (ref 39–117)
Glucose, Bld: 88 mg/dL (ref 70–99)
Potassium: 4.5 meq/L (ref 3.5–5.3)
Sodium: 139 meq/L (ref 135–145)
Total Protein: 7.6 g/dL (ref 6.0–8.3)

## 2012-10-29 LAB — GLUCOSE, POCT (MANUAL RESULT ENTRY): POC Glucose: 72 mg/dL (ref 70–99)

## 2012-10-29 MED ORDER — CIPROFLOXACIN HCL 250 MG PO TABS: 250.0000 mg | ORAL_TABLET | Freq: Two times a day (BID) | ORAL | Status: DC

## 2012-10-29 NOTE — Progress Notes (Signed)
*  PRELIMINARY RESULTS* Vascular Ultrasound Right lower extremity venous duplex has been completed.  Preliminary findings: Right:  No evidence of DVT, superficial thrombosis, or Baker's cyst.  Called report to Dr. Conley Rolls.  Farrel Demark, RDMS, RVT  10/29/2012, 5:59 PM

## 2012-10-29 NOTE — Progress Notes (Signed)
[facility]:  Office Visit  Chief Complaint:  Chief Complaint  Patient presents with  . Dizziness    2 months  . Leg Pain    right leg    HPI: [name] [name] is a 79 y.o. female who complains of : 1. Would like screening for hyperlidiemia and also diabetes.  2. Has had  right calf pain x 2 weeks, progressiively worse, has a h/o blood clots in left Kamara Allan and also in lungs. Painful. Constant Aching.  But no swelling. Had bilateral knee replacement. Her PCP is Dr.  Adrian Prince. Recently went to [place] on plane ride 2 weeks ago, currently has SOB but this is baseline for her.  3. Dizziness-has had for 2 months. Progressively worse. She leans to the left or leans to the right. She used to have dizziness with standing up. Denies stroke like sxs, ie numbness, facial drooping. She has had weakness. Eating and drinking nomral. Normal BP 106/66. Has had abnormal urine smells. Recent UTI ( Rx uribel capsules and TMP-SMZ  BID x 7 days)  Last dose was 1 week ago. Still has odor and dysuria. Denies fevers,c hills, n/v/abd pain. Deneis CP or SOB that is above baseline. .    Past Medical History  Diagnosis Date  . Allergic rhinitis   . History of pulmonary embolism   . DVT (deep venous thrombosis)   . Dyslipidemia   . Pulmonary fibrosis   . DJD (degenerative joint disease)   . Osteoporosis   . CAD (coronary artery disease)     Mild CAD by cath 2008   Past Surgical History  Procedure Laterality Date  . Abdominal hysterectomy    . Appendectomy    . Total knee arthroplasty  1997    Bilateral  . Tonsillectomy     History   Social History  . Marital Status: Widowed    Spouse Name: N/A    Number of Children: 2  . Years of Education: N/A   Occupational History  . 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 Smoking status: Former Smoker -- 0.30 packs/day for 20 years    Types: Cigarettes    Quit date: 07/11/1973  . Smokeless tobacco: None   . Alcohol Use: 0.5 oz/week    1 drink(s) per week  . Drug Use: No  . Sexually Active: No   Other Topics Concern  . None   Social History Narrative   Widowed.   2 children: 1 boy and 1 girl   Alcohol use- yes 2/month   Daily caffeine use ---3/week   Illicit drug use---no   Family History  Problem Relation Age of Onset  . Kidney disease Daughter 5  . Cancer Daughter   . Heart attack Father 73  . Alzheimer's disease Sister    Allergies  Allergen Reactions  . Crestor (Rosuvastatin)     Muscle pain  . Niacin Hives  . Penicillins Hives   Prior to Admission medications   Medication Sig Start Date End Date Taking? Authorizing Provider  ALPRAZolam Prudy Feeler) 0.5 MG tablet Take 0.5 mg by mouth as needed. For anxiety   Yes Historical Provider, MD  aspirin EC 81 MG tablet Take 1 tablet (81 mg total) by mouth daily. 02/27/12 02/26/13 Yes Kathleene Hazel, MD  fluticasone (FLONASE) 50 MCG/ACT nasal spray Place 2 sprays into the nose daily.   Yes Historical Provider, MD  Multiple Vitamin (MULTIVITAMIN) capsule Take 1 capsule  by mouth daily.     Yes Historical Provider, MD  nitroGLYCERIN (NITROSTAT) 0.4 MG SL tablet Place 1 tablet (0.4 mg total) under the tongue every 5 (five) minutes as needed for chest pain. 03/23/12 03/23/13 Yes Kathleene Hazel, MD  valACYclovir (VALTREX) 500 MG tablet Take one tablet by mouth twice daily for three days as needed for outbreak 09/26/12  Yes Alison Murray, MD     ROS: The patient denies fevers, chills, night sweats, unintentional weight loss, chest pain, palpitations, wheezing, dyspnea on exertion, nausea, vomiting, abdominal pain,  hematuria, melena, numbness, weakness, or tingling.   All other systems have been reviewed and were otherwise negative with the exception of those mentioned in the HPI and as above.    PHYSICAL EXAM: Filed Vitals:   10/29/12 1523  BP: 98/62  Pulse: 88  Temp: 98.1 F (36.7 C)  Resp: 18   Filed Vitals:    10/29/12 1523  Height: 5\' 4"  (1.626 m)  Weight: 159 lb (72.122 kg)   Body mass index is 27.28 kg/(m^2).  General: Alert, no acute distress, looks younger than stated age HEENT:  Normocephalic, atraumatic, oropharynx patent. EOMI, PERRLA fundoscpic exam nl Cardiovascular:  Regular rate and rhythm, no rubs murmurs or gallops.  No Carotid bruits, radial pulse intact. No pedal edema.  Respiratory: Clear to auscultation bilaterally.  No wheezes, rales, or rhonchi.  No cyanosis, no use of accessory musculature GI: No organomegaly, abdomen is soft and non-tender, positive bowel sounds.  No masses. Skin: No rashes. Neurologic: Facial musculature symmetric. CN 2-12 grossly normal. Dix hallpike negative. She was dizzy taking orthostatics Psychiatric: Patient is appropriate throughout our interaction. Lymphatic: No cervical lymphadenopathy Musculoskeletal: Gait intact. Right leg-+ +tenderness, varicose veins prominent   LABS: Results for orders placed in visit on 10/29/12  POCT CBC      Result Value Range   WBC 11.0 (*) 4.6 - 10.2 K/uL   Lymph, poc 4.2 (*) 0.6 - 3.4   POC LYMPH PERCENT 38.6  10 - 50 %L   MID (cbc) 1.0 (*) 0 - 0.9   POC MID % 9.2  0 - 12 %M   POC Granulocyte 5.7  2 - 6.9   Granulocyte percent 52.2  37 - 80 %G   RBC 4.94  4.04 - 5.48 M/uL   Hemoglobin 14.6  12.2 - 16.2 g/dL   HCT, POC 57.8  46.9 - 47.9 %   MCV 92.4  80 - 97 fL   MCH, POC 29.6  27 - 31.2 pg   MCHC 32.0  31.8 - 35.4 g/dL   RDW, POC 62.9     Platelet Count, POC 243  142 - 424 K/uL   MPV 9.1  0 - 99.8 fL  GLUCOSE, POCT (MANUAL RESULT ENTRY)      Result Value Range   POC Glucose 72  70 - 99 mg/dl  POCT UA - MICROSCOPIC ONLY      Result Value Range   WBC, Ur, HPF, POC 0-2     RBC, urine, microscopic 5-10     Bacteria, U Microscopic neg     Mucus, UA small     Epithelial cells, urine per micros 0-2     Crystals, Ur, HPF, POC neg     Casts, Ur, LPF, POC neg     Yeast, UA neg    POCT URINALYSIS DIPSTICK       Result Value Range   Color, UA yellow     Clarity, UA  clear     Glucose, UA neg     Bilirubin, UA small     Ketones, UA trace     Spec Grav, UA 1.025     Blood, UA moderate     pH, UA 6.0     Protein, UA trace     Urobilinogen, UA 1.0     Nitrite, UA neg     Leukocytes, UA Negative       EKG/XRAY:   Primary read interpreted by Dr. Conley Rolls at [facility].   ASSESSMENT/PLAN: Encounter Diagnoses  Name Primary?  . Pain in joint, lower leg, right Yes  . Dizziness and giddiness    Orthostatics normal but she got dizzy with laying down, moving too fast Dizziness maybe mutifactorial: postural hypotension, UTI ( she just recently came off of Bactrim for UTI, did not finish last 2 pills of abx), vs less likely hypoglycemia, inner ear or CVA/TIA I will  rx her 3 short days of cipro 250 mg BID for urinary sxs with elevated leukocytosiand reculture her urine. Unable tp  find records of UTI cx She will go to [facility] and get stat doppler for leg pain rule out DVT since she has increased risk factors, long plan ride, prior h/o PE/DVT F/u for separate fasting lipids next week since she is requesting it due to prior h.o hyperlipidemia.  F/u in 1 week for lab visit only, othewise prn     Rajat Staver PHUONG, DO 10/29/2012 4:45 PM    10/10/12--patient got Doppler of Right Keymari Sato-negative for DVT. She was advise to return if sxs do not improve. She will return in 1 week for fasting lipid labs.

## 2012-10-30 ENCOUNTER — Telehealth: Payer: Self-pay | Admitting: Radiology

## 2012-10-30 LAB — URINE CULTURE
Colony Count: NO GROWTH
Organism ID, Bacteria: NO GROWTH

## 2012-10-30 NOTE — Telephone Encounter (Signed)
Informed pt about lab results.

## 2012-11-20 DIAGNOSIS — M5137 Other intervertebral disc degeneration, lumbosacral region: Secondary | ICD-10-CM | POA: Diagnosis not present

## 2012-11-20 DIAGNOSIS — M62838 Other muscle spasm: Secondary | ICD-10-CM | POA: Diagnosis not present

## 2012-11-20 DIAGNOSIS — M999 Biomechanical lesion, unspecified: Secondary | ICD-10-CM | POA: Diagnosis not present

## 2012-11-21 DIAGNOSIS — M5137 Other intervertebral disc degeneration, lumbosacral region: Secondary | ICD-10-CM | POA: Diagnosis not present

## 2012-11-21 DIAGNOSIS — M999 Biomechanical lesion, unspecified: Secondary | ICD-10-CM | POA: Diagnosis not present

## 2012-11-21 DIAGNOSIS — M62838 Other muscle spasm: Secondary | ICD-10-CM | POA: Diagnosis not present

## 2012-12-29 DIAGNOSIS — R0602 Shortness of breath: Secondary | ICD-10-CM | POA: Diagnosis not present

## 2012-12-29 DIAGNOSIS — R42 Dizziness and giddiness: Secondary | ICD-10-CM | POA: Diagnosis not present

## 2012-12-31 ENCOUNTER — Ambulatory Visit (INDEPENDENT_AMBULATORY_CARE_PROVIDER_SITE_OTHER): Payer: Medicare Other | Admitting: Pulmonary Disease

## 2012-12-31 ENCOUNTER — Ambulatory Visit (INDEPENDENT_AMBULATORY_CARE_PROVIDER_SITE_OTHER)
Admission: RE | Admit: 2012-12-31 | Discharge: 2012-12-31 | Disposition: A | Discharge status: Home / Self Care | Payer: Medicare Other | Source: Ambulatory Visit | Attending: Pulmonary Disease | Admitting: Pulmonary Disease

## 2012-12-31 ENCOUNTER — Inpatient Hospital Stay (HOSPITAL_COMMUNITY)
Admission: AD | Admit: 2012-12-31 | Discharge: 2013-01-02 | DRG: 176 | Disposition: A | Discharge status: Home / Self Care | Payer: Medicare Other | Source: Ambulatory Visit | Attending: Pulmonary Disease | Admitting: Pulmonary Disease

## 2012-12-31 ENCOUNTER — Telehealth: Payer: Self-pay | Admitting: Pulmonary Disease

## 2012-12-31 ENCOUNTER — Encounter (HOSPITAL_COMMUNITY): Payer: Self-pay | Admitting: *Deleted

## 2012-12-31 ENCOUNTER — Other Ambulatory Visit (INDEPENDENT_AMBULATORY_CARE_PROVIDER_SITE_OTHER): Payer: Medicare Other

## 2012-12-31 ENCOUNTER — Encounter: Payer: Self-pay | Admitting: Pulmonary Disease

## 2012-12-31 VITALS — BP 94/60 | HR 92 | Temp 97.2°F | Ht 63.5 in | Wt 143.6 lb

## 2012-12-31 DIAGNOSIS — R0989 Other specified symptoms and signs involving the circulatory and respiratory systems: Secondary | ICD-10-CM | POA: Diagnosis not present

## 2012-12-31 DIAGNOSIS — Z86718 Personal history of other venous thrombosis and embolism: Secondary | ICD-10-CM

## 2012-12-31 DIAGNOSIS — R0602 Shortness of breath: Secondary | ICD-10-CM

## 2012-12-31 DIAGNOSIS — J84112 Idiopathic pulmonary fibrosis: Secondary | ICD-10-CM | POA: Diagnosis present

## 2012-12-31 DIAGNOSIS — I82409 Acute embolism and thrombosis of unspecified deep veins of unspecified lower extremity: Secondary | ICD-10-CM

## 2012-12-31 DIAGNOSIS — E876 Hypokalemia: Secondary | ICD-10-CM | POA: Diagnosis present

## 2012-12-31 DIAGNOSIS — R0609 Other forms of dyspnea: Secondary | ICD-10-CM

## 2012-12-31 DIAGNOSIS — R0600 Dyspnea, unspecified: Secondary | ICD-10-CM

## 2012-12-31 DIAGNOSIS — R079 Chest pain, unspecified: Secondary | ICD-10-CM

## 2012-12-31 DIAGNOSIS — I2510 Atherosclerotic heart disease of native coronary artery without angina pectoris: Secondary | ICD-10-CM | POA: Diagnosis present

## 2012-12-31 DIAGNOSIS — I2699 Other pulmonary embolism without acute cor pulmonale: Secondary | ICD-10-CM

## 2012-12-31 DIAGNOSIS — N390 Urinary tract infection, site not specified: Secondary | ICD-10-CM | POA: Diagnosis not present

## 2012-12-31 DIAGNOSIS — M0510 Rheumatoid lung disease with rheumatoid arthritis of unspecified site: Secondary | ICD-10-CM | POA: Diagnosis present

## 2012-12-31 DIAGNOSIS — J309 Allergic rhinitis, unspecified: Secondary | ICD-10-CM

## 2012-12-31 DIAGNOSIS — E785 Hyperlipidemia, unspecified: Secondary | ICD-10-CM

## 2012-12-31 DIAGNOSIS — J019 Acute sinusitis, unspecified: Secondary | ICD-10-CM

## 2012-12-31 DIAGNOSIS — L299 Pruritus, unspecified: Secondary | ICD-10-CM

## 2012-12-31 DIAGNOSIS — R1012 Left upper quadrant pain: Secondary | ICD-10-CM

## 2012-12-31 DIAGNOSIS — J8410 Pulmonary fibrosis, unspecified: Secondary | ICD-10-CM | POA: Diagnosis not present

## 2012-12-31 DIAGNOSIS — K5900 Constipation, unspecified: Secondary | ICD-10-CM

## 2012-12-31 LAB — COMPREHENSIVE METABOLIC PANEL
Albumin: 3.3 g/dL — ABNORMAL LOW (ref 3.5–5.2)
BUN: 13 mg/dL (ref 6–23)
Creatinine, Ser: 0.91 mg/dL (ref 0.50–1.10)
Total Protein: 7.4 g/dL (ref 6.0–8.3)

## 2012-12-31 LAB — CBC WITH DIFFERENTIAL/PLATELET
Basophils Absolute: 0 10*3/uL (ref 0.0–0.1)
Basophils Absolute: 0 10*3/uL (ref 0.0–0.1)
Eosinophils Absolute: 0.1 10*3/uL (ref 0.0–0.7)
HCT: 42.5 % (ref 36.0–46.0)
HCT: 40.2 % (ref 36.0–46.0)
Hemoglobin: 13.3 g/dL (ref 12.0–15.0)
Lymphocytes Relative: 40 % (ref 12–46)
Lymphs Abs: 3.8 10*3/uL (ref 0.7–4.0)
MCHC: 33.8 g/dL (ref 30.0–36.0)
Monocytes Absolute: 0.8 10*3/uL (ref 0.1–1.0)
Monocytes Relative: 8.8 % (ref 3.0–12.0)
Monocytes Relative: 10 % (ref 3–12)
Neutro Abs: 3.7 10*3/uL (ref 1.7–7.7)
Platelets: 192 10*3/uL (ref 150.0–400.0)
RDW: 12.8 % (ref 11.5–14.6)
RDW: 12.8 % (ref 11.5–15.5)
WBC: 7.7 10*3/uL (ref 4.0–10.5)

## 2012-12-31 LAB — BASIC METABOLIC PANEL
CO2: 27 mEq/L (ref 19–32)
Calcium: 9.6 mg/dL (ref 8.4–10.5)
GFR: 69.3 mL/min (ref >60.00)
Sodium: 138 mEq/L (ref 135–145)

## 2012-12-31 LAB — PRO B NATRIURETIC PEPTIDE: Pro B Natriuretic peptide (BNP): 44.3 pg/mL (ref 0–450)

## 2012-12-31 LAB — PROTIME-INR
INR: 0.98 (ref 0.00–1.49)
Prothrombin Time: 12.9 seconds (ref 11.6–15.2)

## 2012-12-31 MED ORDER — SODIUM CHLORIDE 0.9 % IV SOLN
INTRAVENOUS | Status: DC
  Administered 2012-12-31: 22:00:00 via INTRAVENOUS

## 2012-12-31 MED ORDER — ASPIRIN 325 MG PO TABS: 81.0000 mg | ORAL_TABLET | Freq: Every day | ORAL | Status: DC

## 2012-12-31 MED ORDER — DOXYCYCLINE HYCLATE 50 MG PO CAPS: 100.0000 mg | ORAL_CAPSULE | Freq: Two times a day (BID) | ORAL | Status: DC

## 2012-12-31 MED ORDER — ASPIRIN EC 81 MG PO TBEC
81.0000 mg | DELAYED_RELEASE_TABLET | Freq: Every day | ORAL | Status: DC
  Administered 2013-01-01 – 2013-01-02 (×3): 81 mg via ORAL
  Filled 2012-12-31 (×3): qty 1

## 2012-12-31 MED ORDER — WARFARIN SODIUM 3 MG PO TABS
3.0000 mg | ORAL_TABLET | Freq: Once | ORAL | Status: AC
  Administered 2012-12-31: 3 mg via ORAL
  Filled 2012-12-31: qty 1

## 2012-12-31 MED ORDER — SODIUM CHLORIDE 0.9 % IV SOLN: 250.0000 mL | INTRAVENOUS | Status: DC | PRN

## 2012-12-31 MED ORDER — HEPARIN BOLUS VIA INFUSION
1700.0000 [IU] | Freq: Once | INTRAVENOUS | Status: AC
  Administered 2012-12-31: 1700 [IU] via INTRAVENOUS
  Filled 2012-12-31: qty 1700

## 2012-12-31 MED ORDER — ASPIRIN 81 MG PO CHEW: 324.0000 mg | CHEWABLE_TABLET | ORAL | Status: AC

## 2012-12-31 MED ORDER — ASPIRIN EC 81 MG PO TBEC: 81.0000 mg | DELAYED_RELEASE_TABLET | Freq: Every day | ORAL | Status: DC

## 2012-12-31 MED ORDER — IOHEXOL 350 MG/ML SOLN
80.0000 mL | Freq: Once | INTRAVENOUS | Status: AC | PRN
  Administered 2012-12-31: 80 mL via INTRAVENOUS

## 2012-12-31 MED ORDER — ASPIRIN 300 MG RE SUPP: 300.0000 mg | RECTAL | Status: AC

## 2012-12-31 MED ORDER — ASPIRIN 81 MG PO CHEW: 81.0000 mg | CHEWABLE_TABLET | Freq: Every day | ORAL | Status: DC

## 2012-12-31 MED ORDER — HEPARIN (PORCINE) IN NACL 100-0.45 UNIT/ML-% IJ SOLN
950.0000 [IU]/h | INTRAMUSCULAR | Status: DC
  Administered 2012-12-31: 950 [IU]/h via INTRAVENOUS
  Filled 2012-12-31: qty 250

## 2012-12-31 MED ORDER — PANTOPRAZOLE SODIUM 40 MG PO TBEC
40.0000 mg | DELAYED_RELEASE_TABLET | Freq: Every day | ORAL | Status: DC
  Administered 2012-12-31 – 2013-01-02 (×3): 40 mg via ORAL
  Filled 2012-12-31 (×3): qty 1

## 2012-12-31 MED ORDER — WARFARIN - PHARMACIST DOSING INPATIENT: Freq: Every day | Status: DC

## 2012-12-31 NOTE — Progress Notes (Signed)
Please see if this pt ever saw [facility] or [facility] for rheumatology.  See if they can send note if someone there did see her.  Thanks.

## 2012-12-31 NOTE — Telephone Encounter (Signed)
I called [name] [name] to discuss the results of her CT Angio chest which showed filling defects in segmental and RUL pulmonary arteries.  We will admit her to the hospital for a heparin drip, she voiced understanding.

## 2012-12-31 NOTE — Progress Notes (Signed)
ANTICOAGULATION CONSULT NOTE - Initial Consult  Pharmacy Consult for Heparin IV, Warfarin Indication: pulmonary embolus  Allergies  Allergen Reactions  . Crestor (Rosuvastatin)     Muscle pain  . Niacin Hives  . Penicillins Hives    Patient Measurements: Height: 5' 3.5" (161.3 cm) Weight: 143 lb 9.6 oz (65.137 kg) IBW/kg (Calculated) : 53.55  Vital Signs: Temp: 97.8 F (36.6 C) (06/23 1838) Temp src: Oral (06/23 1838) BP: 114/65 mmHg (06/23 1838) Pulse Rate: 95 (06/23 1838)  Labs:  Recent Labs  12/31/12 1253  HGB 14.4  HCT 42.5  PLT 192.0  CREATININE 0.8    Estimated Creatinine Clearance: 51.5 ml/min (by C-G formula based on Cr of 0.8).   Medical History: Past Medical History  Diagnosis Date  . Allergic rhinitis   . History of pulmonary embolism   . DVT (deep venous thrombosis)   . Dyslipidemia   . Pulmonary fibrosis   . DJD (degenerative joint disease)   . Osteoporosis   . CAD (coronary artery disease)     Mild CAD by cath 2008    Medications:  Scheduled:  . aspirin  162.5 mg Oral Daily   Infusions:    Assessment:  82 yoF admit 6/23 from pulmonary clinic with acute PE.  Symptoms started on 6/21 and spontaneously resolved, re-occurred on 6/22 and again improved.  She presented to the clinic on 6/23.  Recent travel to [place] in May-June 2014.  SCr 0.8, CrCl ~ 51 ml/min  Baseline Coags pending:  APTT 32, INR 0.98  Hgb 14.4, Plt 192  Goal of Therapy:  INR 2-3 Heparin level 0.3-0.7 units/ml Monitor platelets by anticoagulation protocol: Yes   Plan:   Give heparin 1700 units bolus IV x 1  Start heparin IV infusion at 950 units/hr  Heparin level 8 hours after starting  Warfarin 3mg  PO x1 tonight at 2100  Daily heparin level, INR, and CBC.  Continue to monitor H&H and platelets   Lynann Beaver PharmD, BCPS Pager [phone] 12/31/2012 8:46 PM

## 2012-12-31 NOTE — Assessment & Plan Note (Signed)
Incidentally, she mentioned to me that she had recently been taking an antibiotic "which changes the color of her urine". I explained to her that she should never ever ever take Macrobid.

## 2012-12-31 NOTE — Addendum Note (Signed)
Addended by: Max Fickle B on: 12/31/2012 05:36 PM   Modules accepted: Orders

## 2012-12-31 NOTE — Progress Notes (Signed)
eLink Physician-Brief Progress Note Patient Name: [name] [name] DOB: [birth year] MRN: [id number]  Date of Service  12/31/2012   HPI/Events of Note   Acute Pulmonary embolism.  Adm from home  eICU Interventions  Full pccm note to follow   Intervention Category Major Interventions: Other: Admission orders, admit from home  [name] 12/31/2012, 6:54 PM

## 2012-12-31 NOTE — Assessment & Plan Note (Signed)
Based on her recent travel and prior episodes of DVT/PE I am most concerned about a pulmonary embolism as the etiology of her shortness of breath and dizziness. If a CT scan does not show clear evidence of a pulmonary embolism and we will treat her for bronchitis as she notes some chills and increasing sputum production over the last few days. I have instructed her to followup with [place] in 7 days if she does not see improvement with doxycycline. If she has a pulmonary embolism of course she'll need to be admitted for anticoagulation.

## 2012-12-31 NOTE — H&P (Signed)
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  SIGNIFICANT EVENTS / STUDIES:  6/23 CT chest >> filling defects in subsegmental vessels in the RLL and central R upper lobe PA, unclear if old or new.  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 See clinic note from 6/23 for more details.  Briefly, she developed the acute onset of dyspnea and dizziness on 6/21 while at home.  She called 911 and recovered on her own at home and refused travel to the ED. Her symptoms recurred on 6/22 and while she was at church, this time associated with back and upper chest pain.  The symptoms again improved spontaneously.  She came to clinic on 6/23 stating that she had been feeling more short of breath since 6/21. She also noted a cough productive of clear sputum and chills.  She denied fever.  Of note, she traveled to [place] in late May, early June 2014.    PAST MEDICAL HISTORY :  Past Medical History  Diagnosis Date  . Allergic rhinitis   . History of pulmonary embolism   . DVT (deep venous thrombosis)   . Dyslipidemia   . Pulmonary fibrosis   . DJD (degenerative joint disease)   . Osteoporosis   . CAD (coronary artery disease)     Mild CAD by cath 2008   Past Surgical History  Procedure Laterality Date  . Abdominal hysterectomy    . Appendectomy    . Total knee arthroplasty  1997    Bilateral  . Tonsillectomy     Prior to Admission medications    Medication Sig Start Date End Date Taking? Authorizing Provider  ALPRAZolam Prudy Feeler) 0.5 MG tablet Take 0.5 mg by mouth as needed. For anxiety   Yes Historical Provider, MD  aspirin EC 81 MG tablet Take 1 tablet (81 mg total) by mouth daily. 02/27/12 02/26/13 Yes Kathleene Hazel, MD  doxycycline (VIBRAMYCIN) 50 MG capsule Take 2 capsules (100 mg total) by mouth 2 (two) times daily. 12/31/12  Yes Lupita Leash, MD  fluticasone (FLONASE) 50 MCG/ACT nasal spray Place 2 sprays into the nose daily.   Yes Historical Provider, MD  Multiple Vitamin (MULTIVITAMIN) capsule Take 1 capsule by mouth daily.     Yes Historical Provider, MD  nitroGLYCERIN (NITROSTAT) 0.4 MG SL tablet Place 1 tablet (0.4 mg total) under the tongue every 5 (five) minutes as needed for chest pain. 03/23/12 03/23/13 Yes Kathleene Hazel, MD  valACYclovir (VALTREX) 500 MG tablet Take one tablet by mouth twice daily for three days as needed for outbreak 09/26/12  Yes Alison Murray, MD   Allergies  Allergen Reactions  . Crestor (Rosuvastatin)     Muscle pain  . Niacin Hives  . Penicillins Hives    FAMILY HISTORY:  Family History  Problem Relation Age of Onset  . Kidney disease Daughter 5  . Cancer Daughter   . Heart attack Father 46  .  Alzheimer's disease Sister    SOCIAL HISTORY:  reports that she quit smoking about 39 years ago. Her smoking use included Cigarettes. She has a 6 pack-year smoking history. She does not have any smokeless tobacco history on file. She reports that she drinks about 0.5 ounces of alcohol per week. She reports that she does not use illicit drugs.  REVIEW OF SYSTEMS:   Gen: Denies fever, + chills, weight change, fatigue, night sweats HEENT: Denies blurred vision, double vision, hearing loss, tinnitus, sinus congestion, rhinorrhea, sore throat, neck stiffness, dysphagia PULM: see HPI CV: Denies chest pain, edema, orthopnea, paroxysmal nocturnal dyspnea, palpitations GI: Denies  abdominal pain, nausea, vomiting, diarrhea, hematochezia, melena, constipation, change in bowel habits GU: Denies dysuria, hematuria, polyuria, oliguria, urethral discharge Endocrine: Denies hot or cold intolerance, polyuria, polyphagia or appetite change Derm: Denies rash, dry skin, scaling or peeling skin change Heme: Denies easy bruising, bleeding, bleeding gums Neuro: Denies headache, numbness, weakness, slurred speech, loss of memory or consciousness r  SUBJECTIVE:   VITAL SIGNS:  Filed Vitals:   12/31/12 1208  BP: 94/60  Pulse: 92  Temp: 97.2 F (36.2 C)  TempSrc: Oral  Height: 5' 3.5" (1.613 m)  Weight: 143 lb 9.6 oz (65.137 kg)  SpO2: 97%    HEMODYNAMICS: @HEMODYNAMICS @ VENTILATOR SETTINGS:   INTAKE / OUTPUT: Intake/Output   None     PHYSICAL EXAMINATION: General:  Comfortable, no acute distres Neuro:  A&Ox4, non-focal HEENT:  NCAT, PERRL, EOMi, OP clear Cardiovascular:  RRR, no mgr Lungs:  Crackles in bases Bilaterally Abdomen:  Soft, nontender Musculoskeletal:  Normal bulk and tone Skin:  No skin breakdown  LABS:  Recent Labs Lab 12/31/12 1253  HGB 14.4  WBC 10.0  PLT 192.0  NA 138  K 4.0  CL 103  CO2 27  GLUCOSE 95  BUN 13  CREATININE 0.8  CALCIUM 9.6   No results found for this basename: GLUCAP,  in the last 168 hours  CXR:   ASSESSMENT / PLAN:  PULMONARY A: Recurrent Pulmonary embolism> radiology states that filling defects could be old, but considering the abrupt onset of symptoms associated with dizziness after overseas travel and a prior history of PE, the likelihood of PE is high. UIP > either due to rheumatoid lung or IPF, not abrupt changes  P:   -admit to tele -heparin gtt -warfarin protocol (she has taken this before for PE), consider Xarelto -O2 as needed -f/u with Dr. Shelle Iron as outpatient for UIP/IPF  CARDIOVASCULAR A: PE, as above P:  -continue home ASA 81mg   RENAL A:  No acute issues P:      GASTROINTESTINAL A:  No acute issues P:   -regular diet  HEMATOLOGIC A:  PE, as above P:  -monitor for bleeding -check baseline INR  INFECTIOUS A:  No acute issues P:     ENDOCRINE A:  No acute issues P:     NEUROLOGIC A:  No acute issues P:    TODAY'S SUMMARY: Admit for treatment of recurrent pulmonary embolism with heparin/warfarin.  Fonnie Jarvis Pulmonary and Critical Care Medicine [facility] Pager: [phone]  12/31/2012, 5:15 PM

## 2012-12-31 NOTE — Patient Instructions (Signed)
We will call you with the results of your CT chest  IF you do not have a blood clot, then take the doxycycline 100mg  by mouth twice a day for a week  Take it with yogurt and avoid excessive sun exposure  If you are not better in a week let us know

## 2012-12-31 NOTE — Progress Notes (Signed)
Subjective:    Patient ID: [name] [name], female    DOB: [birth year], 79 y.o.   MRN: [id number]  HPI  12/31/2012 ROV >> this is a very pleasant 79 year old female with pulmonary fibrosis due to either to idiopathic pulmonary fibrosis versus "rheumatoid lung". She comes her clinic today because she's had increasing shortness of breath and dizziness over the last few days. She dropped to [place] approximately one month ago and had no significant respiratory symptoms during that trip. However she did start taking an antibiotic for a urinary tract infection while she was there. Approximately 3 days prior to today's visit she developed the acute onset of shortness of breath and dizziness. She called paramedics who came out to evaluate her and recommended hospitalization but she decided to stay home instead. She said that she started to feel better while they were there and her vital signs were "normal". On the following day well she was at church she noticed weakness, shortness of breath and dizziness again. The symptoms subsided eventually. This is been associated with chills, and a cough productive of clear sputum. Today she feels "okay" but she still feels generally weak compared to before. She denies dysuria.   Past Medical History  Diagnosis Date  . Allergic rhinitis   . History of pulmonary embolism   . DVT (deep venous thrombosis)   . Dyslipidemia   . Pulmonary fibrosis   . DJD (degenerative joint disease)   . Osteoporosis   . CAD (coronary artery disease)     Mild CAD by cath 2008     Family History  Problem Relation Age of Onset  . Kidney disease Daughter 5  . Cancer Daughter   . Heart attack Father 36  . Alzheimer's disease Sister      History   Social History  . Marital Status: Widowed    Spouse Name: N/A    Number of Children: 2  . Years of Education: N/A   Occupational History  . 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 Smoking status:  Former Smoker -- 0.30 packs/day for 20 years    Types: Cigarettes    Quit date: 07/11/1973  . Smokeless tobacco: Not on file  . Alcohol Use: 0.5 oz/week    1 drink(s) per week  . Drug Use: No  . Sexually Active: No   Other Topics Concern  . Not on file   Social History Narrative   Widowed.   2 children: 1 boy and 1 girl   Alcohol use- yes 2/month   Daily caffeine use ---3/week   Illicit drug use---no     Allergies  Allergen Reactions  . Crestor (Rosuvastatin)     Muscle pain  . Niacin Hives  . Penicillins Hives     Outpatient Prescriptions Prior to Visit  Medication Sig Dispense Refill  . ALPRAZolam (XANAX) 0.5 MG tablet Take 0.5 mg by mouth as needed. For anxiety      . aspirin EC 81 MG tablet Take 1 tablet (81 mg total) by mouth daily.  30 tablet  0  . fluticasone (FLONASE) 50 MCG/ACT nasal spray Place 2 sprays into the nose daily.      . Multiple Vitamin (MULTIVITAMIN) capsule Take 1 capsule by mouth daily.        . nitroGLYCERIN (NITROSTAT) 0.4 MG SL tablet Place 1 tablet (0.4 mg total) under the tongue every 5 (five) minutes as needed for chest pain.  25 tablet  6  . valACYclovir (VALTREX) 500 MG tablet Take one tablet by mouth twice daily for three days as needed for outbreak  30 tablet  0  . ciprofloxacin (CIPRO) 250 MG tablet Take 1 tablet (250 mg total) by mouth 2 (two) times daily.  6 tablet  0   No facility-administered medications prior to visit.       Review of Systems  Constitutional: Positive for chills and fatigue. Negative for fever.  HENT: Negative for congestion, rhinorrhea and postnasal drip.   Respiratory: Positive for cough and shortness of breath. Negative for wheezing.   Cardiovascular: Negative for chest pain, palpitations and leg swelling.       Objective:   Physical Exam  Filed Vitals:   12/31/12 1208  BP: 94/60  Pulse: 92  Temp: 97.2 F (36.2 C)  TempSrc: Oral  Height: 5' 3.5" (1.613 m)  Weight: 143 lb 9.6 oz (65.137 kg)   SpO2: 97%  RA  Gen: well appearing, no acute distress HEENT: NCAT, PERRL, EOMi,  PULM: Crackles in bases CV: RRR, no mgr, no JVD AB: BS+, soft, nontender, no hsm Ext: warm, trace bilateral edema, no clubbing, no cyanosis       Assessment & Plan:   Shortness of breath Based on her recent travel and prior episodes of DVT/PE I am most concerned about a pulmonary embolism as the etiology of her shortness of breath and dizziness. If a CT scan does not show clear evidence of a pulmonary embolism and we will treat her for bronchitis as she notes some chills and increasing sputum production over the last few days. I have instructed her to followup with [place] in 7 days if she does not see improvement with doxycycline. If she has a pulmonary embolism of course she'll need to be admitted for anticoagulation.  PULMONARY FIBROSIS, IDIOPATHIC Incidentally, she mentioned to me that she had recently been taking an antibiotic "which changes the color of her urine". I explained to her that she should never ever ever take Macrobid.   Updated Medication List Outpatient Encounter Prescriptions as of 12/31/2012  Medication Sig Dispense Refill  . ALPRAZolam (XANAX) 0.5 MG tablet Take 0.5 mg by mouth as needed. For anxiety      . aspirin EC 81 MG tablet Take 1 tablet (81 mg total) by mouth daily.  30 tablet  0  . fluticasone (FLONASE) 50 MCG/ACT nasal spray Place 2 sprays into the nose daily.      . Multiple Vitamin (MULTIVITAMIN) capsule Take 1 capsule by mouth daily.        . nitroGLYCERIN (NITROSTAT) 0.4 MG SL tablet Place 1 tablet (0.4 mg total) under the tongue every 5 (five) minutes as needed for chest pain.  25 tablet  6  . valACYclovir (VALTREX) 500 MG tablet Take one tablet by mouth twice daily for three days as needed for outbreak  30 tablet  0  . doxycycline (VIBRAMYCIN) 50 MG capsule Take 2 capsules (100 mg total) by mouth 2 (two) times daily.  14 capsule  0  . [DISCONTINUED] ciprofloxacin (CIPRO)  250 MG tablet Take 1 tablet (250 mg total) by mouth 2 (two) times daily.  6 tablet  0   No facility-administered encounter medications on file as of 12/31/2012.

## 2013-01-01 DIAGNOSIS — I2699 Other pulmonary embolism without acute cor pulmonale: Secondary | ICD-10-CM | POA: Diagnosis present

## 2013-01-01 LAB — HEPARIN LEVEL (UNFRACTIONATED): Heparin Unfractionated: 0.45 IU/mL (ref 0.30–0.70)

## 2013-01-01 LAB — BASIC METABOLIC PANEL
BUN: 11 mg/dL (ref 6–23)
Creatinine, Ser: 0.78 mg/dL (ref 0.50–1.10)
GFR calc Af Amer: 89 mL/min — ABNORMAL LOW (ref >90)
GFR calc non Af Amer: 77 mL/min — ABNORMAL LOW (ref >90)
Potassium: 3.3 mEq/L — ABNORMAL LOW (ref 3.5–5.1)

## 2013-01-01 LAB — CBC
Hemoglobin: 12.6 g/dL (ref 12.0–15.0)
MCH: 28.8 pg (ref 26.0–34.0)
RBC: 4.37 MIL/uL (ref 3.87–5.11)

## 2013-01-01 LAB — PROTIME-INR: Prothrombin Time: 13.5 seconds (ref 11.6–15.2)

## 2013-01-01 MED ORDER — WARFARIN SODIUM 3 MG PO TABS
3.0000 mg | ORAL_TABLET | Freq: Once | ORAL | Status: AC
  Administered 2013-01-01: 3 mg via ORAL
  Filled 2013-01-01: qty 1

## 2013-01-01 MED ORDER — POTASSIUM CHLORIDE CRYS ER 20 MEQ PO TBCR
40.0000 meq | EXTENDED_RELEASE_TABLET | Freq: Once | ORAL | Status: AC
  Administered 2013-01-01: 40 meq via ORAL
  Filled 2013-01-01: qty 2

## 2013-01-01 MED ORDER — ENOXAPARIN (LOVENOX) PATIENT EDUCATION KIT
PACK | Freq: Once | Status: AC
  Administered 2013-01-01: 10:00:00
  Filled 2013-01-01: qty 1

## 2013-01-01 MED ORDER — ENOXAPARIN SODIUM 80 MG/0.8ML ~~LOC~~ SOLN
1.0000 mg/kg | Freq: Two times a day (BID) | SUBCUTANEOUS | Status: DC
  Administered 2013-01-01 – 2013-01-02 (×3): 70 mg via SUBCUTANEOUS
  Filled 2013-01-01 (×4): qty 0.8

## 2013-01-01 NOTE — Progress Notes (Signed)
ANTICOAGULATION CONSULT NOTE -F/U Consult  Pharmacy Consult for Heparin IV, Warfarin Indication: pulmonary embolus  Allergies  Allergen Reactions  . Crestor (Rosuvastatin)     Muscle pain  . Niacin Hives  . Penicillins Hives    Patient Measurements: Height: 5' 3.5" (161.3 cm) Weight: 143 lb 9.6 oz (65.137 kg) IBW/kg (Calculated) : 53.55  Vital Signs: Temp: 98.1 F (36.7 C) (06/24 0450) Temp src: Oral (06/24 0450) BP: 104/58 mmHg (06/24 0450) Pulse Rate: 78 (06/24 0450)  Labs:  Recent Labs  12/31/12 1253 12/31/12 1948 12/31/12 2000 01/01/13 0423  HGB 14.4 13.3  --  12.6  HCT 42.5 40.2  --  38.5  PLT 192.0 173  --  170  APTT  --  32  --   --   LABPROT  --   --  12.9 13.5  INR  --   --  0.98 1.04  HEPARINUNFRC  --   --   --  0.45  CREATININE 0.8 0.91  --  0.78    Estimated Creatinine Clearance: 51.5 ml/min (by C-G formula based on Cr of 0.78).   Medical History: Past Medical History  Diagnosis Date  . Allergic rhinitis   . History of pulmonary embolism   . DVT (deep venous thrombosis)   . Dyslipidemia   . Pulmonary fibrosis   . DJD (degenerative joint disease)   . Osteoporosis   . CAD (coronary artery disease)     Mild CAD by cath 2008    Medications:  Scheduled:  . aspirin EC  81 mg Oral Daily  . pantoprazole  40 mg Oral Daily  . Warfarin - Pharmacist Dosing Inpatient   Does not apply q1800   Infusions:  . sodium chloride 50 mL/hr at 12/31/12 2228  . heparin 950 Units/hr (12/31/12 2228)    Assessment:  82 yoF admit 6/23 from pulmonary clinic with acute PE.  Symptoms started on 6/21 and spontaneously resolved, re-occurred on 6/22 and again improved.  She presented to the clinic on 6/23.  Recent travel to [place] in May-June 2014.  SCr 0.8, CrCl ~ 51 ml/min  HL= 0.45 units/ml after 1700 unit bolus and drip @ 950 unit/shr x ~ 6 hrs.  No IV interuptions or bleeding noted per RN.   Goal of Therapy:  INR 2-3 Heparin level 0.3-0.7  units/ml Monitor platelets by anticoagulation protocol: Yes   Plan:   Continue heparin IV infusion at 950 units/hr  Daily heparin level, INR, and CBC.  Continue to monitor H&H and platelets  Recheck HL @ 11am today.    Lorenza Evangelist 01/01/2013 5:38 AM

## 2013-01-01 NOTE — Progress Notes (Signed)
VASCULAR LAB PRELIMINARY  PRELIMINARY  PRELIMINARY  PRELIMINARY  Bilateral lower extremity venous duplex  completed.    Preliminary report:  Bilateral:  No evidence of DVT, superficial thrombosis, or Baker's Cyst.    Charnika Herbst, RVT 01/01/2013, 9:46 AM

## 2013-01-01 NOTE — H&P (Signed)
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 Of note, she traveled to [place] in late May, early June 2014.     SIGNIFICANT EVENTS / STUDIES:  6/23 CT chest >> filling defects in subsegmental vessels in the RLL and central R upper lobe PA, unclear if old or new.  Also with findings consistent with UIP and mediastinal lymphadenopathy   SUBJECTIVE: denies CP, dyspnea  VITAL SIGNS:  Filed Vitals:   12/31/12 1838 12/31/12 2109 01/01/13 0450  BP: 114/65 105/58 104/58  Pulse: 95 83 78  Temp: 97.8 F (36.6 C) 97.6 F (36.4 C) 98.1 F (36.7 C)  TempSrc: Oral Oral Oral  Resp: 20 19 18   Height: 5' 3.5" (1.613 m)    Weight: 65.137 kg (143 lb 9.6 oz)  71 kg (156 lb 8.4 oz)  SpO2: 95% 96% 97%    HEMODYNAMICS: @HEMODYNAMICS @ VENTILATOR SETTINGS:   INTAKE / OUTPUT: Intake/Output     06/23 0701 - 06/24 0700 06/24 0701 - 06/25 0700   Total Intake(mL/kg)     Urine (mL/kg/hr) 400    Total Output 400     Net -400          Urine Occurrence 1 x      PHYSICAL EXAMINATION: General:  Comfortable, no acute distres Neuro:  A&Ox4, non-focal HEENT:  NCAT, PERRL, EOMi, OP clear Cardiovascular:  RRR, no mgr Lungs:  Crackles in bases Bilaterally Abdomen:  Soft, nontender Musculoskeletal:  Normal bulk and tone Skin:  No skin breakdown  LABS:  Recent Labs Lab 12/31/12 1253 12/31/12 1948 12/31/12 1949 12/31/12 2000 01/01/13 0423  HGB 14.4 13.3  --   --  12.6  WBC 10.0 7.7  --   --  8.0  PLT 192.0 173  --   --  170  NA 138 139  --   --  137  K 4.0 3.7  --   --  3.3*  CL 103 103  --   --  104  CO2 27 27  --   --  23  GLUCOSE 95 105*  --   --   139*  BUN 13 13  --   --  11  CREATININE 0.8 0.91  --   --  0.78  CALCIUM 9.6 9.4  --   --  9.0  AST  --  22  --   --   --   ALT  --  18  --   --   --   ALKPHOS  --  85  --   --   --   BILITOT  --  0.3  --   --   --   PROT  --  7.4  --   --   --   ALBUMIN  --  3.3*  --   --   --   APTT  --  32  --   --   --   INR  --   --   --  0.98 1.04  PROBNP  --   --  44.3  --   --    No results found for this basename: GLUCAP,  in the  last 168 hours  CXR:   ASSESSMENT / PLAN:  PULMONARY A: Recurrent Pulmonary embolism> radiology states that filling defects could be old, but considering the abrupt onset of symptoms associated with dizziness after overseas travel and a prior history of PE, the likelihood of PE is high. UIP > either due to rheumatoid lung or IPF, not abrupt changes  P:   -admit to tele -dc heparin gtt - change to lovenox bid -warfarin protocol (she has taken this before for PE), consider Xarelto -O2 as needed -f/u with Dr. Shelle Iron as outpatient for UIP/IPF & coumadin clinic -duplex BLE to r/o DVT -Hope to dc in 24h once lovenox teaching done   CARDIOVASCULAR A: PE, as above P:  -continue home ASA 81mg   RENAL A:  Hypokalemia P:  replete  ALVA,RAKESH V.,MD Pulmonary and Critical Care Medicine [facility] Pager: [phone]  01/01/2013, 8:32 AM

## 2013-01-01 NOTE — Progress Notes (Signed)
eLink Physician-Brief Progress Note Patient Name: Cybele Maule DOB: 06-Oct-1930 MRN: [id number]  Date of Service  01/01/2013   HPI/Events of Note  Call from nurse reporting that patient states she has frequent UTIs and feels like she is coming down with one.  Nurse reports cloudy urine with odor.  eICU Interventions  Plan: Routine UA Urine culture   Intervention Category Minor Interventions: Clinical assessment - ordering diagnostic tests  DETERDING,ELIZABETH 01/01/2013, 11:53 PM

## 2013-01-01 NOTE — Progress Notes (Addendum)
ANTICOAGULATION CONSULT NOTE - Follow Up  Pharmacy Consult for Warfarin, Change IV Heparin to SQ Lovenox Indication: pulmonary embolus  Allergies  Allergen Reactions  . Crestor (Rosuvastatin)     Muscle pain  . Niacin Hives  . Penicillins Hives    Patient Measurements: Height: 5' 3.5" (161.3 cm) Weight: 156 lb 8.4 oz (71 kg) IBW/kg (Calculated) : 53.55  Labs:  Recent Labs  12/31/12 1253 12/31/12 1948 12/31/12 2000 01/01/13 0423  HGB 14.4 13.3  --  12.6  HCT 42.5 40.2  --  38.5  PLT 192.0 173  --  170  APTT  --  32  --   --   LABPROT  --   --  12.9 13.5  INR  --   --  0.98 1.04  HEPARINUNFRC  --   --   --  0.45  CREATININE 0.8 0.91  --  0.78    Estimated Creatinine Clearance: 53.7 ml/min (by C-G formula based on Cr of 0.78).   Assessment:  79 yoF admit 6/23 from pulmonary clinic with acute PE.  Symptoms started on 6/21 and spontaneously resolved, re-occurred on 6/22 and again improved.  She presented to the clinic on 6/23.  Recent travel to [place] in May-June 2014.  Patient was started on IV heparin and PO warfarin on 6/23  Today is Day #2 overlap, plan is to change IV heparin to SQ Lovenox today  Spoke with RN - will stop heparin around 9am, plan to give Lovenox 1 hour later.  Will dose Lovenox at 1mg /kg q12h per MD request (BID dosing)  INR remains subtherapeutic as expected. Using lower, more conservative warfarin dose initially due to age = 79 y.o.  Per MD note, patient has been on warfarin previously, considering Xarelto  Goal of Therapy:  INR 2-3   Plan:   Stop IV heparin at 9am  At 10am start Lovenox 70mg  SQ q12h  Repeat warfarin 3mg  PO x1 at 18:00  Continue Lovenox/warfarin overlap for a minimum of 5 days AND until INR >2 x2 consecutive days.  Change CBC to q72h per lovenox protocol  Daily INRs  Lovenox teaching kit - RN to review with patient  Darrol Angel, PharmD Pager: [phone] 01/01/2013 8:43 AM   ADDENDUM: Standard  warfarin education completed with patient and handout given. Pt very engaged in education, asked appropriate questions. Patient expressed verbal understanding. No further questions at this time.  Darrol Angel, PharmD Pager: [phone] 01/01/2013 3:29 PM

## 2013-01-02 DIAGNOSIS — I2699 Other pulmonary embolism without acute cor pulmonale: Secondary | ICD-10-CM | POA: Diagnosis not present

## 2013-01-02 LAB — CBC
HCT: 39.2 % (ref 36.0–46.0)
Hemoglobin: 12.8 g/dL (ref 12.0–15.0)
MCH: 28.8 pg (ref 26.0–34.0)
RBC: 4.44 MIL/uL (ref 3.87–5.11)

## 2013-01-02 LAB — URINALYSIS, ROUTINE W REFLEX MICROSCOPIC
Glucose, UA: NEGATIVE mg/dL
Ketones, ur: NEGATIVE mg/dL
pH: 6.5 (ref 5.0–8.0)

## 2013-01-02 LAB — BASIC METABOLIC PANEL
Chloride: 106 mEq/L (ref 96–112)
GFR calc non Af Amer: 67 mL/min — ABNORMAL LOW (ref >90)
Glucose, Bld: 98 mg/dL (ref 70–99)
Potassium: 3.9 mEq/L (ref 3.5–5.1)
Sodium: 137 mEq/L (ref 135–145)

## 2013-01-02 MED ORDER — NITROFURANTOIN MONOHYD MACRO 100 MG PO CAPS: 100.0000 mg | ORAL_CAPSULE | Freq: Two times a day (BID) | ORAL | Status: DC

## 2013-01-02 MED ORDER — WARFARIN SODIUM 5 MG PO TABS: 5.0000 mg | ORAL_TABLET | Freq: Once | ORAL | Status: DC

## 2013-01-02 MED ORDER — ENOXAPARIN SODIUM 80 MG/0.8ML ~~LOC~~ SOLN: 1.0000 mg/kg | Freq: Two times a day (BID) | SUBCUTANEOUS | Status: DC

## 2013-01-02 MED ORDER — WARFARIN SODIUM 5 MG PO TABS
5.0000 mg | ORAL_TABLET | Freq: Once | ORAL | Status: DC
  Filled 2013-01-02: qty 1

## 2013-01-02 NOTE — Discharge Summary (Signed)
dPhysician Discharge Summary  Patient ID: [name] [name] MRN: [id number] DOB/AGE: May 04, 1931 79 y.o.  Admit date: 12/31/2012 Discharge date: 01/02/2013    Discharge Diagnoses:  Recurrent PE UIP Hypokalemia Clinical UTI                                                                    DISCHARGE PLAN BY DIAGNOSIS    Recurrent Pulmonary Embolism  UIP - either due to Rheumatoid Lung or IPF, no abrupt changes  Discharge Plan: -Coumadin 5 mg QD until follow up in [facility] -Lovenox 70 mg QD until INR check on 6/27, pt understands how to self medicate -f/u with Dr. Shelle Iron as outpatient for UIP/IPF & PE  -recommend life long coumadin as this is patients 4th episode of clotting  -Continue Lovenox/warfarin overlap for a minimum of 5 days (thru at least 6/28) AND until INR >2 x2 consecutive days  CAD Discharge Plan: -continue home ASA 81mg     Hypokalemia - Resolved.   UTI  Pt complained of UTI symptoms prior to discharge - burning with urination, frequency & urgency.  Has had before and reports feels the same.   Discharge Plan: -Macrobid 100 mg PO BID x5 days (no antibiotic interaction with coumadin)               DISCHARGE SUMMARY   [name] [name] is a 79 y.o. y/o female with a PMH of UIP (rheumatoid lung vs IPF) and a prior pulmonary embolism / DVT (x 3), CAD who presented from Pulmonary Clinic after she developed the acute onset of dyspnea and dizziness on 6/21 while at home. She called 911 and recovered on her own at home and refused travel to the ED. Her symptoms recurred on 6/22 and while she was at church, this time associated with back and upper chest pain. The symptoms again improved spontaneously. She came to clinic on 6/23 stating that she had been feeling more short of breath since 6/21. She also noted a cough productive of clear sputum and chills. She denied fever. Of note, she traveled to [place] in late May, early June 2014.  CT Chest evaluated and demonstrated filling  defects in subsegmental vessels in the RLL and central R upper lobe PA, unclear if old or new.  Also with findings consistent with UIP and mediastinal lymphadenopathy.  Patient was treated with full dose lovenox and coumadin initiated. Recommend continue on Lovenox / Warfarin overlap for a minimum of 5 days (thru at least 6/28) AND until INR >2 x2 consecutive days.               SIGNIFICANT DIAGNOSTIC STUDIES 6/23 CT chest >> filling defects in subsegmental vessels in the RLL and central R upper lobe PA, unclear if old or new. Also with findings consistent with UIP and mediastinal lymphadenopathy  6/24 LE Doppler >> negative for DVT  CONSULTS Pharmacy - Coumadin Management   Discharge Exam: General: Comfortable, no acute distres  Neuro: A&Ox4, non-focal  HEENT: NCAT, PERRL, EOMi, OP clear  Cardiovascular: RRR, no mgr  Lungs: Crackles in bases Bilaterally  Abdomen: Soft, nontender  Musculoskeletal: Normal bulk and tone  Skin: No skin breakdown   Filed Vitals:   01/01/13 0450 01/01/13 1429 01/01/13 2128 01/02/13 0504  BP: 104/58  109/63 131/73 109/61  Pulse: 78 88 84 72  Temp: 98.1 F (36.7 C) 98.4 F (36.9 C) 98.1 F (36.7 C) 98.2 F (36.8 C)  TempSrc: Oral Oral Oral Oral  Resp: 18 18 18 18   Height:      Weight: 156 lb 8.4 oz (71 kg)   156 lb 8.4 oz (71 kg)  SpO2: 97% 97% 98% 97%    Discharge Labs  BMET  Recent Labs Lab 12/31/12 1253 12/31/12 1948 01/01/13 0423 01/02/13 0420  NA 138 139 137 137  K 4.0 3.7 3.3* 3.9  CL 103 103 104 106  CO2 27 27 23 25   GLUCOSE 95 105* 139* 98  BUN 13 13 11 11   CREATININE 0.8 0.91 0.78 0.81  CALCIUM 9.6 9.4 9.0 9.3    CBC  Recent Labs Lab 12/31/12 1948 01/01/13 0423 01/02/13 0420  HGB 13.3 12.6 12.8  HCT 40.2 38.5 39.2  WBC 7.7 8.0 8.2  PLT 173 170 183    Anti-Coagulation  Recent Labs Lab 12/31/12 2000 01/01/13 0423 01/02/13 0420  INR 0.98 1.04 1.11    Discharge Orders   Future Appointments Provider  Department Dept Phone   01/04/2013 4:00 PM [facility] [phone]   01/28/2013 3:15 PM Barbaraann Share, MD [facility] [phone]   Future Orders Complete By Expires     Call MD for:  difficulty breathing, headache or visual disturbances  As directed     Call MD for:  persistant dizziness or light-headedness  As directed     Call MD for:  temperature >100.4  As directed     Call MD for:  As directed     Scheduling Instructions:      Call for bleeding that you can not control.    Diet general  As directed     Discharge instructions  As directed     Comments:      1. Take Coumadin 5 mg every evening until seen in Coumadin Clinic 2. Take Lovenox 70 mg once in am and once in PM.  Leave air bubble in syringe 3.  Follow your diet instructions related to coumadin.   4. Let your other healthcare providers know that you have been started on coumadin.    Increase activity slowly  As directed            Follow-up Information   Follow up with [facility] On 01/04/2013. (Appt at 4 PM)    Contact information:    [facility] [address]      Follow up with Julian Hy, MD. (As needed)    Contact information:   [address] [place] [address] [phone]       Follow up with Barbaraann Share, MD On 01/28/2013. (Appt at 3:15 PM)    Contact information:   [address] [place] [address] [phone]         Medication List    TAKE these medications       ALPRAZolam 0.5 MG tablet  Commonly known as:  XANAX  Take 0.5 mg by mouth as needed. For anxiety     aspirin EC 81 MG tablet  Take 1 tablet (81 mg total) by mouth daily.     doxycycline 50 MG capsule  Commonly known as:  VIBRAMYCIN  Take 2 capsules (100 mg total) by mouth 2 (two) times daily.     enoxaparin 80 MG/0.8ML injection  Commonly known as:  LOVENOX  Inject 0.7 mLs (70 mg total) into the skin every 12 (twelve) hours.      fluticasone 50 MCG/ACT nasal spray  Commonly known as:  FLONASE  Place 2 sprays into the nose daily.     multivitamin capsule  Take 1 capsule by mouth daily.     nitroGLYCERIN 0.4 MG SL tablet  Commonly known as:  NITROSTAT  Place 1 tablet (0.4 mg total) under the tongue every 5 (five) minutes as needed for chest pain.     warfarin 5 MG tablet  Commonly known as:  COUMADIN  Take 1 tablet (5 mg total) by mouth one time only at 6 PM.          Disposition:  Discharge to Home with follow up in [facility]  Discharged Condition: [name] [name] has met maximum benefit of inpatient care and is medically stable and cleared for discharge.  Patient is pending follow up as above.      Time spent on disposition:  Greater than 35 minutes.   Signed: Canary Brim, NP-C  Pulmonary & Critical Care Pgr: [phone] Office: [phone]  Independently examined pt, evaluated data & formulated above discharge care plan with NP [facility].   CC: Dr. Evlyn Kanner, [facility]

## 2013-01-02 NOTE — Progress Notes (Signed)
Call to Canary Brim NP as pt has complaint of UTI symptoms; malodorous, and painful to urinate. UA was ordered by night shift. Pt states she GYN gave her pills that turn her urine "blue", and she was instructed to take the pills as she needs them and stop when her symptoms resolve. She feels she has been masking a UTI, and wanted to inquire if she needed treatment based on symptoms and UA last night.   Will await return call from [facility] or Dr. Shelle Iron.

## 2013-01-02 NOTE — Progress Notes (Signed)
Pt demonstrates ability to inject Lovenox and instructions of it's use - denies further questions.  Did report that she has frequent UTI's and currently her urine is malodorous and it "stings"when she voids.  I have reported it to night MD and have collected a UA and Culture.

## 2013-01-02 NOTE — Progress Notes (Signed)
ANTICOAGULATION CONSULT NOTE - Follow Up  Pharmacy Consult for Warfarin, Lovenox Indication: pulmonary embolus  Allergies  Allergen Reactions  . Crestor (Rosuvastatin)     Muscle pain  . Niacin Hives  . Penicillins Hives    Patient Measurements: Height: 5' 3.5" (161.3 cm) Weight: 156 lb 8.4 oz (71 kg) IBW/kg (Calculated) : 53.55  Labs:  Recent Labs  12/31/12 1948 12/31/12 2000 01/01/13 0423 01/02/13 0420  HGB 13.3  --  12.6 12.8  HCT 40.2  --  38.5 39.2  PLT 173  --  170 183  APTT 32  --   --   --   LABPROT  --  12.9 13.5 14.2  INR  --  0.98 1.04 1.11  HEPARINUNFRC  --   --  0.45  --   CREATININE 0.91  --  0.78 0.81    Estimated Creatinine Clearance: 53 ml/min (by C-G formula based on Cr of 0.81).   Assessment:  79 yo F admit 6/23 from pulmonary clinic with acute PE.  Symptoms started on 6/21 and spontaneously resolved, re-occurred on 6/22 and again improved.  She presented to the clinic on 6/23.  Recent travel to [place] in May-June 2014.  Patient was started on IV heparin and PO warfarin on 6/23, IV heparin changed to SQ Lovenox on 6/24  Today is Day #3 overlap with warfarin/heparin-product  Lovenox dosed at 1mg /kg q12h per MD request (BID dosing)   INR remains subtherapeutic as expected, but would have expected it to start to trend upward by this morning. Have been using lower, more conservative warfarin dose initially due to age = 79 y.o. Will give larger dose tonight.  Per MD note, patient has been on warfarin previously, considering Xarelto  Goal of Therapy:  INR 2-3   Plan:   Continue Lovenox 70mg  SQ q12h  Increase warfarin to 5mg  PO x1 at 18:00  Continue Lovenox/warfarin overlap for a minimum of 5 days (thru at least 6/28) AND until INR >2 x2 consecutive days.  Daily INRs while hospitalized  Lovenox teaching per RN. Warfarin education completed yesterday  If patient is discharged today, suggest sending home on warfarin 5mg  daily plus Lovenox  70mg  SQ q12h (thru at least 6/28) and having INR checked on Fri 6/27.  Darrol Angel, PharmD Pager: [phone] 01/02/2013 7:43 AM   ADDENDUM: Standard warfarin education completed with patient and handout given. Pt very engaged in education, asked appropriate questions. Patient expressed verbal understanding. No further questions at this time.  Darrol Angel, PharmD Pager: [phone] 01/02/2013 7:43 AM

## 2013-01-04 ENCOUNTER — Ambulatory Visit (INDEPENDENT_AMBULATORY_CARE_PROVIDER_SITE_OTHER): Payer: Medicare Other

## 2013-01-04 ENCOUNTER — Telehealth: Payer: Self-pay | Admitting: Pulmonary Disease

## 2013-01-04 DIAGNOSIS — I2699 Other pulmonary embolism without acute cor pulmonale: Secondary | ICD-10-CM | POA: Diagnosis not present

## 2013-01-04 DIAGNOSIS — Z7901 Long term (current) use of anticoagulants: Secondary | ICD-10-CM

## 2013-01-04 DIAGNOSIS — E785 Hyperlipidemia, unspecified: Secondary | ICD-10-CM | POA: Diagnosis not present

## 2013-01-04 LAB — URINE CULTURE: Colony Count: >=100000

## 2013-01-04 LAB — POCT INR: INR: 2

## 2013-01-04 NOTE — Telephone Encounter (Signed)
Spoke with patient, patient was discharged from hospital 6/25 w recurrent PE Sent home on 70mg  Lovenex injections Patient states she has had no problem with the injections until yesterday morning. Stated she gave herself injection and a short time later she realized she had blood on her clothing. She stated she has not had anymore bleeding or other issues since. She cleaned injection site w alcohol and placed bandaid on. I advised patient that this does sometimes occur with any injections and esp with Lovenex/blood thining injs. I made her aware that I would forward to Dr. Shelle Iron as well.

## 2013-01-04 NOTE — Telephone Encounter (Signed)
LMTCB on Monday.

## 2013-01-04 NOTE — Telephone Encounter (Signed)
I agree.  Please let her know to keep an eye on it. Make sure she has an upcoming visit with anticoagulation clinic.

## 2013-01-07 NOTE — Telephone Encounter (Signed)
Pt stated the injections are done with. She does have appt 01/08/13. Nothing further was needed

## 2013-01-08 ENCOUNTER — Ambulatory Visit (INDEPENDENT_AMBULATORY_CARE_PROVIDER_SITE_OTHER): Payer: Medicare Other | Admitting: General Practice

## 2013-01-08 DIAGNOSIS — Z86718 Personal history of other venous thrombosis and embolism: Secondary | ICD-10-CM | POA: Diagnosis not present

## 2013-01-08 DIAGNOSIS — J8410 Pulmonary fibrosis, unspecified: Secondary | ICD-10-CM | POA: Diagnosis not present

## 2013-01-08 DIAGNOSIS — E785 Hyperlipidemia, unspecified: Secondary | ICD-10-CM | POA: Diagnosis not present

## 2013-01-08 DIAGNOSIS — Z7901 Long term (current) use of anticoagulants: Secondary | ICD-10-CM

## 2013-01-08 DIAGNOSIS — I2510 Atherosclerotic heart disease of native coronary artery without angina pectoris: Secondary | ICD-10-CM | POA: Diagnosis not present

## 2013-01-08 DIAGNOSIS — I2699 Other pulmonary embolism without acute cor pulmonale: Secondary | ICD-10-CM

## 2013-01-08 LAB — POCT INR: INR: 2.2

## 2013-01-15 ENCOUNTER — Ambulatory Visit (INDEPENDENT_AMBULATORY_CARE_PROVIDER_SITE_OTHER): Payer: Medicare Other | Admitting: General Practice

## 2013-01-15 DIAGNOSIS — E559 Vitamin D deficiency, unspecified: Secondary | ICD-10-CM | POA: Diagnosis not present

## 2013-01-15 DIAGNOSIS — J8410 Pulmonary fibrosis, unspecified: Secondary | ICD-10-CM | POA: Diagnosis not present

## 2013-01-15 DIAGNOSIS — Z86718 Personal history of other venous thrombosis and embolism: Secondary | ICD-10-CM | POA: Diagnosis not present

## 2013-01-15 DIAGNOSIS — E785 Hyperlipidemia, unspecified: Secondary | ICD-10-CM | POA: Diagnosis not present

## 2013-01-15 DIAGNOSIS — Z23 Encounter for immunization: Secondary | ICD-10-CM | POA: Diagnosis not present

## 2013-01-15 DIAGNOSIS — M810 Age-related osteoporosis without current pathological fracture: Secondary | ICD-10-CM | POA: Diagnosis not present

## 2013-01-15 DIAGNOSIS — Z7901 Long term (current) use of anticoagulants: Secondary | ICD-10-CM

## 2013-01-15 DIAGNOSIS — I2699 Other pulmonary embolism without acute cor pulmonale: Secondary | ICD-10-CM

## 2013-01-15 DIAGNOSIS — Z6826 Body mass index (BMI) 26.0-26.9, adult: Secondary | ICD-10-CM | POA: Diagnosis not present

## 2013-01-15 DIAGNOSIS — I2510 Atherosclerotic heart disease of native coronary artery without angina pectoris: Secondary | ICD-10-CM | POA: Diagnosis not present

## 2013-01-22 ENCOUNTER — Ambulatory Visit (INDEPENDENT_AMBULATORY_CARE_PROVIDER_SITE_OTHER): Payer: Medicare Other | Admitting: General Practice

## 2013-01-22 DIAGNOSIS — Z7901 Long term (current) use of anticoagulants: Secondary | ICD-10-CM | POA: Diagnosis not present

## 2013-01-22 DIAGNOSIS — I2699 Other pulmonary embolism without acute cor pulmonale: Secondary | ICD-10-CM | POA: Diagnosis not present

## 2013-01-22 LAB — POCT INR: INR: 1.7

## 2013-01-28 ENCOUNTER — Ambulatory Visit (INDEPENDENT_AMBULATORY_CARE_PROVIDER_SITE_OTHER): Payer: Medicare Other | Admitting: Pulmonary Disease

## 2013-01-28 ENCOUNTER — Encounter: Payer: Self-pay | Admitting: Pulmonary Disease

## 2013-01-28 ENCOUNTER — Telehealth: Payer: Self-pay | Admitting: Pulmonary Disease

## 2013-01-28 VITALS — BP 104/62 | HR 77 | Temp 97.7°F | Ht 63.5 in | Wt 159.2 lb

## 2013-01-28 DIAGNOSIS — J8410 Pulmonary fibrosis, unspecified: Secondary | ICD-10-CM | POA: Diagnosis not present

## 2013-01-28 DIAGNOSIS — I2699 Other pulmonary embolism without acute cor pulmonale: Secondary | ICD-10-CM

## 2013-01-28 NOTE — Patient Instructions (Addendum)
Will get your note from the rheumatology consult, and will discuss with you. Stay as active as possible. You will need to stay on coumadin for life. If doing well, would like to see you back in 6mos, but call if having worsening issues.

## 2013-01-28 NOTE — Telephone Encounter (Signed)
Need last few rheumatology notes from [facility] medical on this pt.  Asap.  Thanks.

## 2013-01-28 NOTE — Progress Notes (Signed)
  Subjective:    Patient ID: [name] [name], female    DOB: 08/23/1930, 79 y.o.   MRN: [id number]  HPI The patient comes in today for followup of her known interstitial lung disease.  She recently was diagnosed with her fourth pulmonary embolus, and now is on life time Coumadin.  It is interesting that we had done a ventilation perfusion scan and lower extremity Dopplers in April because of symptoms of a pulmonary embolus, and these were unremarkable.  The patient feels that she is much improved since her hospitalization, and is nearly back to her usual baseline.  She continues to have dyspnea on exertion.  Most recently, she has had some viral URI symptoms that are slowly improving.   Review of Systems  Constitutional: Negative for fever and unexpected weight change.  HENT: Positive for congestion, sore throat, rhinorrhea, dental problem, postnasal drip and sinus pressure. Negative for ear pain, nosebleeds, sneezing and trouble swallowing.   Eyes: Positive for itching. Negative for redness.  Respiratory: Positive for cough and chest tightness. Negative for shortness of breath and wheezing.   Cardiovascular: Positive for leg swelling. Negative for palpitations.  Gastrointestinal: Negative for nausea and vomiting.  Genitourinary: Negative for dysuria.  Musculoskeletal: Negative for joint swelling.  Skin: Negative for rash.  Neurological: Positive for headaches.  Hematological: Does not bruise/bleed easily.  Psychiatric/Behavioral: Negative for dysphoric mood. The patient is not nervous/anxious.        Objective:   Physical Exam Thin female in no acute distress Nose without purulence or discharge noted Neck without lymphadenopathy or thyromegaly Chest with crackles in both bases, no wheezing Cardiac exam with regular rate and rhythm Lower extremities with 1+ ankle edema and varicosities, no cyanosis noted Alert and oriented, moves all 4 extremities.       Assessment & Plan:

## 2013-01-28 NOTE — Assessment & Plan Note (Signed)
The patient has usual interstitial pneumonitis by high resolution CT, and was recently noted to have an elevated rheumatoid factor.  She was referred to rheumatology at last visit, and I have not received their consult note.  I will get this for review.  In the meantime, I have asked the patient to stay as active as possible, and we'll refer her to pulmonary rehabilitation.

## 2013-01-28 NOTE — Assessment & Plan Note (Signed)
The patient has been started on Coumadin, and will need to be on this lifetime given her fourth episode of thromboembolic disease.  She feels that her breathing has gotten back near her usual baseline.

## 2013-01-29 ENCOUNTER — Other Ambulatory Visit: Payer: Self-pay | Admitting: Pulmonary Disease

## 2013-01-29 ENCOUNTER — Encounter: Payer: Self-pay | Admitting: Pulmonary Disease

## 2013-01-29 DIAGNOSIS — J8410 Pulmonary fibrosis, unspecified: Secondary | ICD-10-CM

## 2013-01-30 NOTE — Telephone Encounter (Signed)
Records requested

## 2013-01-30 NOTE — Telephone Encounter (Signed)
Records requested are in Lorita Forinash folder for review.

## 2013-02-04 DIAGNOSIS — J069 Acute upper respiratory infection, unspecified: Secondary | ICD-10-CM | POA: Diagnosis not present

## 2013-02-05 ENCOUNTER — Ambulatory Visit (INDEPENDENT_AMBULATORY_CARE_PROVIDER_SITE_OTHER): Payer: Medicare Other | Admitting: General Practice

## 2013-02-05 DIAGNOSIS — Z7901 Long term (current) use of anticoagulants: Secondary | ICD-10-CM | POA: Diagnosis not present

## 2013-02-05 DIAGNOSIS — I2699 Other pulmonary embolism without acute cor pulmonale: Secondary | ICD-10-CM | POA: Diagnosis not present

## 2013-02-05 LAB — POCT INR: INR: 1.5

## 2013-02-07 ENCOUNTER — Telehealth: Payer: Self-pay | Admitting: Pulmonary Disease

## 2013-02-07 DIAGNOSIS — J029 Acute pharyngitis, unspecified: Secondary | ICD-10-CM | POA: Diagnosis not present

## 2013-02-07 DIAGNOSIS — R0602 Shortness of breath: Secondary | ICD-10-CM | POA: Diagnosis not present

## 2013-02-07 DIAGNOSIS — R079 Chest pain, unspecified: Secondary | ICD-10-CM | POA: Diagnosis not present

## 2013-02-07 DIAGNOSIS — I2699 Other pulmonary embolism without acute cor pulmonale: Secondary | ICD-10-CM | POA: Diagnosis not present

## 2013-02-07 NOTE — Telephone Encounter (Signed)
I spoke with [name] [name]. She is calling to let us know she saw PCP last week for chills/sweats. She was dx with URI- was giving ABX and told to f/u in few days. She is going to see them today. This is FYI for [facility].

## 2013-02-13 ENCOUNTER — Other Ambulatory Visit: Payer: Self-pay

## 2013-02-13 DIAGNOSIS — Z86711 Personal history of pulmonary embolism: Secondary | ICD-10-CM | POA: Diagnosis not present

## 2013-02-13 DIAGNOSIS — J84112 Idiopathic pulmonary fibrosis: Secondary | ICD-10-CM | POA: Diagnosis not present

## 2013-02-13 DIAGNOSIS — Z7901 Long term (current) use of anticoagulants: Secondary | ICD-10-CM | POA: Diagnosis not present

## 2013-02-13 DIAGNOSIS — Z7982 Long term (current) use of aspirin: Secondary | ICD-10-CM | POA: Diagnosis not present

## 2013-02-13 DIAGNOSIS — J8410 Pulmonary fibrosis, unspecified: Secondary | ICD-10-CM | POA: Diagnosis not present

## 2013-02-19 ENCOUNTER — Ambulatory Visit (INDEPENDENT_AMBULATORY_CARE_PROVIDER_SITE_OTHER): Payer: Medicare Other | Admitting: General Practice

## 2013-02-19 DIAGNOSIS — Z7901 Long term (current) use of anticoagulants: Secondary | ICD-10-CM | POA: Diagnosis not present

## 2013-02-19 DIAGNOSIS — I2699 Other pulmonary embolism without acute cor pulmonale: Secondary | ICD-10-CM | POA: Diagnosis not present

## 2013-03-12 ENCOUNTER — Ambulatory Visit (INDEPENDENT_AMBULATORY_CARE_PROVIDER_SITE_OTHER): Payer: Medicare Other | Admitting: General Practice

## 2013-03-12 DIAGNOSIS — Z7901 Long term (current) use of anticoagulants: Secondary | ICD-10-CM

## 2013-03-12 DIAGNOSIS — I2699 Other pulmonary embolism without acute cor pulmonale: Secondary | ICD-10-CM | POA: Diagnosis not present

## 2013-03-31 ENCOUNTER — Ambulatory Visit (INDEPENDENT_AMBULATORY_CARE_PROVIDER_SITE_OTHER): Payer: Medicare Other | Admitting: Family Medicine

## 2013-03-31 ENCOUNTER — Ambulatory Visit: Payer: Medicare Other

## 2013-03-31 VITALS — BP 116/74 | HR 90 | Temp 98.7°F | Resp 16 | Ht 64.5 in | Wt 154.8 lb

## 2013-03-31 DIAGNOSIS — J209 Acute bronchitis, unspecified: Secondary | ICD-10-CM

## 2013-03-31 DIAGNOSIS — R0602 Shortness of breath: Secondary | ICD-10-CM | POA: Diagnosis not present

## 2013-03-31 DIAGNOSIS — Z7901 Long term (current) use of anticoagulants: Secondary | ICD-10-CM

## 2013-03-31 DIAGNOSIS — J029 Acute pharyngitis, unspecified: Secondary | ICD-10-CM

## 2013-03-31 DIAGNOSIS — I82409 Acute embolism and thrombosis of unspecified deep veins of unspecified lower extremity: Secondary | ICD-10-CM

## 2013-03-31 DIAGNOSIS — R05 Cough: Secondary | ICD-10-CM

## 2013-03-31 DIAGNOSIS — J309 Allergic rhinitis, unspecified: Secondary | ICD-10-CM

## 2013-03-31 DIAGNOSIS — J8410 Pulmonary fibrosis, unspecified: Secondary | ICD-10-CM

## 2013-03-31 DIAGNOSIS — R059 Cough, unspecified: Secondary | ICD-10-CM | POA: Diagnosis not present

## 2013-03-31 DIAGNOSIS — Z86718 Personal history of other venous thrombosis and embolism: Secondary | ICD-10-CM

## 2013-03-31 LAB — POCT CBC
Hemoglobin: 15.3 g/dL (ref 12.2–16.2)
MPV: 9.6 fL (ref 0–99.8)
POC Granulocyte: 5.4 (ref 2–6.9)
POC MID %: 7.5 %M (ref 0–12)
RBC: 5.14 M/uL (ref 4.04–5.48)

## 2013-03-31 LAB — PROTIME-INR: Prothrombin Time: 22.4 seconds — ABNORMAL HIGH (ref 11.6–15.2)

## 2013-03-31 MED ORDER — FLUTICASONE PROPIONATE 50 MCG/ACT NA SUSP: 2.0000 | Freq: Every day | NASAL | Status: DC

## 2013-03-31 MED ORDER — MUCINEX DM 30-600 MG PO TB12: 1.0000 | ORAL_TABLET | Freq: Two times a day (BID) | ORAL | Status: DC

## 2013-03-31 MED ORDER — DOXYCYCLINE HYCLATE 100 MG PO CAPS: 100.0000 mg | ORAL_CAPSULE | Freq: Two times a day (BID) | ORAL | Status: DC

## 2013-03-31 NOTE — Progress Notes (Signed)
[address]   [place], [place]  [address]   [phone]  Subjective:    Patient ID: [name] [name], female    DOB: 1931/05/28, 79 y.o.   MRN: [id number]  Cough Associated symptoms include chills, headaches, postnasal drip, rhinorrhea, a sore throat and shortness of breath. Pertinent negatives include no ear pain, fever, rash or wheezing.  Sore Throat  Associated symptoms include congestion, coughing, headaches and shortness of breath. Pertinent negatives include no diarrhea, ear pain, trouble swallowing or vomiting.   This 80 y.o. female presents for evaluation of five day history of laryngitis, rhinorrhea, coughing.  Taking salt watre gargles, throat lozenges.  Colds usually last three days.  No fever but +chills.  +sweats.  +HA.  +ST severe; no ear pain.  ST now improved.  Clear rhinorrhea; +nasal congestion; +PPND.  +coughing and has gotten deep.  Sputum production none.  No v/d.  +sneezing; No itchy eyes or nose.  Fall is bad time for allergies.  +SOB with worsening.  History of pneumonia.   Felt horrible yesterday.  History of pulmonary fibrosis.   Not using Flonase currently.  Does not have active rx.   Review of Systems  Constitutional: Positive for chills, diaphoresis, activity change and fatigue. Negative for fever.  HENT: Positive for congestion, sore throat, rhinorrhea, sneezing, voice change and postnasal drip. Negative for ear pain and trouble swallowing.   Respiratory: Positive for cough and shortness of breath. Negative for wheezing.   Cardiovascular: Negative for leg swelling.  Gastrointestinal: Negative for nausea, vomiting and diarrhea.  Skin: Negative for rash.  Neurological: Positive for headaches.    Past Medical History  Diagnosis Date  . Allergic rhinitis   . History of pulmonary embolism   . DVT (deep venous thrombosis)   . Dyslipidemia   . Pulmonary fibrosis   . DJD (degenerative joint disease)   . Osteoporosis   . CAD (coronary artery disease)     Mild CAD  by cath 2008    Past Surgical History  Procedure Laterality Date  . Abdominal hysterectomy    . Appendectomy    . Total knee arthroplasty  1997    Bilateral  . Tonsillectomy      Prior to Admission medications   Medication Sig Start Date End Date Taking? Authorizing Provider  ALPRAZolam Prudy Feeler) 0.5 MG tablet Take 0.5 mg by mouth as needed. For anxiety   Yes Historical Provider, MD  fluticasone (FLONASE) 50 MCG/ACT nasal spray Place 2 sprays into the nose daily.   Yes Historical Provider, MD  Multiple Vitamin (MULTIVITAMIN) capsule Take 1 capsule by mouth daily.     Yes Historical Provider, MD  warfarin (COUMADIN) 5 MG tablet Take 1 tablet (5 mg total) by mouth one time only at 6 PM. 01/02/13  Yes Jeanella Craze, NP  atorvastatin (LIPITOR) 20 MG tablet Take 20 mg by mouth daily.    Historical Provider, MD  nitroGLYCERIN (NITROSTAT) 0.4 MG SL tablet Place 1 tablet (0.4 mg total) under the tongue every 5 (five) minutes as needed for chest pain. 03/23/12 03/23/13  Kathleene Hazel, MD    Allergies  Allergen Reactions  . Crestor [Rosuvastatin]     Muscle pain  . Niacin Hives  . Penicillins Hives    History   Social History  . Marital Status: Widowed    Spouse Name: N/A    Number of Children: 2  . Years of Education: N/A   Occupational History  . 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 Smoking status: Former Smoker -- 0.30 packs/day for 20 years    Types: Cigarettes    Quit date: 07/11/1973  . Smokeless tobacco: Never Used  . Alcohol Use: 0.0 oz/week     Comment: approx 1 glass wine per week  . Drug Use: No  . Sexual Activity: No   Other Topics Concern  . Not on file   Social History Narrative   Widowed.   2 children: 1 boy and 1 girl   Alcohol use- yes 2/month   Daily caffeine use ---3/week   Illicit drug use---no    Family History  Problem Relation Age of Onset  . Kidney disease Daughter 5  . Cancer Daughter   .  Heart attack Father 5  . Alzheimer's disease Sister        Objective:   Physical Exam  Nursing note and vitals reviewed. Constitutional: She is oriented to person, place, and time. She appears well-developed and well-nourished. No distress.  HENT:  Head: Normocephalic and atraumatic.  Right Ear: External ear normal.  Left Ear: External ear normal.  Nose: Nose normal.  Mouth/Throat: Oropharynx is clear and moist.  Eyes: Conjunctivae are normal. Pupils are equal, round, and reactive to light.  Neck: Normal range of motion. Neck supple.  Cardiovascular: Normal rate, regular rhythm and normal heart sounds.   No murmur heard. Pulmonary/Chest: Effort normal. No respiratory distress. She has no wheezes. She has rales.  Lymphadenopathy:    She has no cervical adenopathy.  Neurological: She is alert and oriented to person, place, and time.  Skin: She is not diaphoretic.  Psychiatric: She has a normal mood and affect. Her behavior is normal.   Results for orders placed in visit on 03/31/13  CULTURE, GROUP A STREP      Result Value Range   Organism ID, Bacteria Normal Upper Respiratory Flora     Organism ID, Bacteria No Beta Hemolytic Streptococci Isolated    PROTIME-INR      Result Value Range   Prothrombin Time 22.4 (*) 11.6 - 15.2 seconds   INR 2.04 (*) <1.50  POCT CBC      Result Value Range   WBC 10.1  4.6 - 10.2 K/uL   Lymph, poc 4.0 (*) 0.6 - 3.4   POC LYMPH PERCENT 39.5  10 - 50 %L   MID (cbc) 0.8  0 - 0.9   POC MID % 7.5  0 - 12 %M   POC Granulocyte 5.4  2 - 6.9   Granulocyte percent 53.0  37 - 80 %G   RBC 5.14  4.04 - 5.48 M/uL   Hemoglobin 15.3  12.2 - 16.2 g/dL   HCT, POC 45.4 (*) 09.8 - 47.9 %   MCV 93.4  80 - 97 fL   MCH, POC 29.8  27 - 31.2 pg   MCHC 31.9  31.8 - 35.4 g/dL   RDW, POC 11.9     Platelet Count, POC 184  142 - 424 K/uL   MPV 9.6  0 - 99.8 fL   [facility] reading (PRIMARY) by  Dr. Katrinka Blazing.  CXR:  Chronic pulmonary fibrosis changes stable from previous  exam 10/2011; NAD; ?RLL cavitation lesion? Versus pulmonary fibrosis changes.     Assessment & Plan:  SOB (shortness of breath) - Plan: POCT CBC, Protime-INR, DG Chest 2 View  Cough - Plan: POCT CBC, Protime-INR, DG Chest 2 View  Postinflammatory pulmonary fibrosis - Plan: POCT CBC, Protime-INR,  DG Chest 2 View  Acute bronchitis  Acute pharyngitis - Plan: Culture, Group A Strep  DEEP VENOUS THROMBOPHLEBITIS  ALLERGIC RHINITIS  PULMONARY EMBOLISM, HX OF  Long term (current) use of anticoagulants   1.  Acute bronchitis with pharyngitis: New.  CXR stable without acute infiltrate; treat with Doxycycline due to pulmonary fibrosis and age.  Recommend Mucinex DM bid. 2.  Pulmonary fibrosis: stable; will treat acute infection with antibiotics due to pulmonary fibrosis. RTC for acute worsening. 3.  DVT/pulmonary embolism/long-term use of anticoagulants: stable; obtain PT/INR due to initiation of antibiotics. 4. Allergic Rhinitis: worsening; rx for Flonase provided.  Meds ordered this encounter  Medications  . DISCONTD: doxycycline (VIBRAMYCIN) 100 MG capsule    Sig: Take 1 capsule (100 mg total) by mouth 2 (two) times daily.    Dispense:  20 capsule    Refill:  0  . fluticasone (FLONASE) 50 MCG/ACT nasal spray    Sig: Place 2 sprays into the nose daily.    Dispense:  16 g    Refill:  11  . DISCONTD: Dextromethorphan-Guaifenesin (MUCINEX DM) 30-600 MG TB12    Sig: Take 1 tablet by mouth 2 (two) times daily.    Dispense:  28 each    Refill:  0

## 2013-03-31 NOTE — Patient Instructions (Addendum)
1. Only take 1/2 tablet of Coumadin daily while taking Doxycycline. 2. Resume normal schedule of Coumadin when you finish Doxycycline. 3.  Have Coumadin level checked in one week.   4.  Return immediately for worsening symptoms/shortness of breath.

## 2013-04-01 ENCOUNTER — Telehealth: Payer: Self-pay

## 2013-04-01 MED ORDER — AZITHROMYCIN 250 MG PO TABS: ORAL_TABLET | ORAL | Status: DC

## 2013-04-01 NOTE — Telephone Encounter (Signed)
Sent in rx for Zithromax; can stop Doxycycline.  Please advise patient.

## 2013-04-01 NOTE — Telephone Encounter (Signed)
Spoke with pt, Doxycycline is making her vomit. I advised her to take it with food and avoid milk. Pt wants Dr Katrinka Blazing to advise and possibly change her ABX to something else.

## 2013-04-01 NOTE — Telephone Encounter (Signed)
Patient advised.

## 2013-04-01 NOTE — Telephone Encounter (Signed)
Dr. Katrinka Blazing - do you want to change this pt's abx?  Sounds like she cannot tolerate the doxy.  With PCN allergy and coumadin use, options are somewhat limited.  Are you ok with Bactrim?

## 2013-04-02 LAB — CULTURE, GROUP A STREP: Organism ID, Bacteria: NORMAL

## 2013-04-03 ENCOUNTER — Telehealth: Payer: Self-pay

## 2013-04-03 NOTE — Telephone Encounter (Signed)
Medication is keeping patient in the bathroom.  This is second med - not working for her.  [phone]

## 2013-04-03 NOTE — Telephone Encounter (Signed)
She has d/c the doxy due to vomiting. It did resolve. She indicates she has now started the Zpack. She has diarrhea. I advised her to get a probiotic (align) and to use this for at least 2 weeks. She is encouraged to drink LOTS of water and to call back if the symptoms do not resolve. She agrees. To you FYI

## 2013-04-09 ENCOUNTER — Ambulatory Visit (INDEPENDENT_AMBULATORY_CARE_PROVIDER_SITE_OTHER): Payer: Medicare Other | Admitting: General Practice

## 2013-04-09 DIAGNOSIS — Z7901 Long term (current) use of anticoagulants: Secondary | ICD-10-CM | POA: Diagnosis not present

## 2013-04-09 DIAGNOSIS — I2699 Other pulmonary embolism without acute cor pulmonale: Secondary | ICD-10-CM

## 2013-04-16 DIAGNOSIS — Z23 Encounter for immunization: Secondary | ICD-10-CM | POA: Diagnosis not present

## 2013-04-16 DIAGNOSIS — E785 Hyperlipidemia, unspecified: Secondary | ICD-10-CM | POA: Diagnosis not present

## 2013-04-16 DIAGNOSIS — Z0000 Encounter for general adult medical examination without abnormal findings: Secondary | ICD-10-CM | POA: Diagnosis not present

## 2013-04-16 DIAGNOSIS — J8410 Pulmonary fibrosis, unspecified: Secondary | ICD-10-CM | POA: Diagnosis not present

## 2013-04-18 DIAGNOSIS — N318 Other neuromuscular dysfunction of bladder: Secondary | ICD-10-CM | POA: Diagnosis not present

## 2013-04-18 DIAGNOSIS — N3941 Urge incontinence: Secondary | ICD-10-CM | POA: Diagnosis not present

## 2013-04-18 DIAGNOSIS — R3129 Other microscopic hematuria: Secondary | ICD-10-CM | POA: Diagnosis not present

## 2013-04-18 DIAGNOSIS — N3020 Other chronic cystitis without hematuria: Secondary | ICD-10-CM | POA: Diagnosis not present

## 2013-04-18 NOTE — Telephone Encounter (Signed)
Made  in error please disregard this entry °

## 2013-04-30 ENCOUNTER — Ambulatory Visit: Payer: Medicare Other

## 2013-04-30 ENCOUNTER — Ambulatory Visit (INDEPENDENT_AMBULATORY_CARE_PROVIDER_SITE_OTHER): Payer: Medicare Other | Admitting: Family Medicine

## 2013-04-30 ENCOUNTER — Telehealth: Payer: Self-pay | Admitting: Pulmonary Disease

## 2013-04-30 VITALS — BP 110/70 | HR 69 | Temp 98.1°F | Resp 20 | Wt 158.0 lb

## 2013-04-30 DIAGNOSIS — J8410 Pulmonary fibrosis, unspecified: Secondary | ICD-10-CM | POA: Diagnosis not present

## 2013-04-30 DIAGNOSIS — R0602 Shortness of breath: Secondary | ICD-10-CM

## 2013-04-30 DIAGNOSIS — Z7901 Long term (current) use of anticoagulants: Secondary | ICD-10-CM | POA: Diagnosis not present

## 2013-04-30 DIAGNOSIS — Z5181 Encounter for therapeutic drug level monitoring: Secondary | ICD-10-CM | POA: Diagnosis not present

## 2013-04-30 LAB — POCT CBC
Granulocyte percent: 55.6 %G (ref 37–80)
HCT, POC: 43.7 % (ref 37.7–47.9)
Hemoglobin: 13.5 g/dL (ref 12.2–16.2)
POC Granulocyte: 5.4 (ref 2–6.9)
POC LYMPH PERCENT: 38 %L (ref 10–50)
RDW, POC: 15 %

## 2013-04-30 LAB — COMPREHENSIVE METABOLIC PANEL
ALT: 22 U/L (ref 0–35)
AST: 30 U/L (ref 0–37)
Albumin: 4.1 g/dL (ref 3.5–5.2)
Alkaline Phosphatase: 87 U/L (ref 39–117)
Glucose, Bld: 80 mg/dL (ref 70–99)
Potassium: 3.9 mEq/L (ref 3.5–5.3)
Sodium: 139 mEq/L (ref 135–145)
Total Protein: 7.7 g/dL (ref 6.0–8.3)

## 2013-04-30 LAB — PROTIME-INR: INR: 1.79 — ABNORMAL HIGH (ref <1.50)

## 2013-04-30 NOTE — Progress Notes (Addendum)
[facility] [address] [place] [address] [phone]  Date:  04/30/2013   Name:  [name] [name]   DOB:  Sep 06, 1930   MRN:  [id number]  PCP:  Julian Hy, MD    Chief Complaint: Shortness of Breath and Cough   History of Present Illness:  [name] [name] is a 79 y.o. very pleasant female patient who presents with the following:  She is here today with SOB, dizziness, blurred vision, runny eyes.  She has notes these sx for 2 or 3 days.  Yesterday she felt better, but his morning she felt "like I'm not getting air to my brain or to my legs."   She is not having any chest pain.   She has not noted a fever.   She has noted some cough.    She takes coumadin due to history of PE diagnosed earlier this year.   History of pulmonary fibrosis for about 10 years.  She is a pt of Dr. Shelle Iron.  She reports that her lung disease does tend to bother her to a certain extent at baseline.  However this is worse than she has experienced in the recent past.    Patient Active Problem List   Diagnosis Date Noted  . Long term (current) use of anticoagulants 01/04/2013  . Pulmonary embolism 01/01/2013  . Chest pain 02/27/2012  . CAD (coronary artery disease) 02/27/2012  . Dyspnea 11/07/2011  . Acute sinusitis, unspecified 11/03/2010  . PRURITUS 08/18/2009  . Postinflammatory pulmonary fibrosis 06/30/2008  . CONSTIPATION 01/03/2008  . ABDOMINAL PAIN-LUQ 01/03/2008  . DYSLIPIDEMIA 06/16/2007  . DEEP VENOUS THROMBOPHLEBITIS 06/16/2007  . ALLERGIC RHINITIS 06/16/2007  . PULMONARY EMBOLISM, HX OF 06/16/2007    Past Medical History  Diagnosis Date  . Allergic rhinitis   . History of pulmonary embolism   . DVT (deep venous thrombosis)   . Dyslipidemia   . Pulmonary fibrosis   . DJD (degenerative joint disease)   . Osteoporosis   . CAD (coronary artery disease)     Mild CAD by cath 2008    Past Surgical History  Procedure Laterality Date  . Abdominal hysterectomy     . Appendectomy    . Total knee arthroplasty  1997    Bilateral  . Tonsillectomy      History  Substance Use Topics  . Smoking status: Former Smoker -- 0.30 packs/day for 20 years    Types: Cigarettes    Quit date: 07/11/1973  . Smokeless tobacco: Never Used  . Alcohol Use: 0.0 oz/week     Comment: approx 1 glass wine per week    Family History  Problem Relation Age of Onset  . Kidney disease Daughter 5  . Cancer Daughter   . Heart attack Father 27  . Alzheimer's disease Sister     Allergies  Allergen Reactions  . Crestor [Rosuvastatin]     Muscle pain  . Niacin Hives  . Penicillins Hives    Medication list has been reviewed and updated.  Current Outpatient Prescriptions on File Prior to Visit  Medication Sig Dispense Refill  . ALPRAZolam (XANAX) 0.5 MG tablet Take 0.5 mg by mouth as needed. For anxiety      . fluticasone (FLONASE) 50 MCG/ACT nasal spray Place 2 sprays into the nose daily.  16 g  11  . Multiple Vitamin (MULTIVITAMIN) capsule Take 1 capsule by mouth daily.        [name] warfarin (COUMADIN) 5 MG tablet Take 1 tablet (  5 mg total) by mouth one time only at 6 PM.  60 tablet  3  . azithromycin (ZITHROMAX) 250 MG tablet Two tablets daily x 5 days  10 tablet  0  . nitroGLYCERIN (NITROSTAT) 0.4 MG SL tablet Place 1 tablet (0.4 mg total) under the tongue every 5 (five) minutes as needed for chest pain.  25 tablet  6   Current Facility-Administered Medications on File Prior to Visit  Medication Dose Route Frequency Provider Last Rate Last Dose  . 0.9 %  sodium chloride infusion  250 mL Intravenous PRN Lupita Leash, MD        Review of Systems:  As per HPI- otherwise negative.   Physical Examination: Filed Vitals:   04/30/13 1450  BP: 100/58  Pulse: 92  Temp: 98.1 F (36.7 C)  Resp: 20   There were no vitals filed for this visit. There is no weight on file to calculate BMI. Ideal Body Weight:    GEN: WDWN, NAD, Non-toxic, A & O x 3, looks  well HEENT: Atraumatic, Normocephalic. Neck supple. No masses, No LAD.  Bilateral TM wnl, oropharynx normal.  PEERL,EOMI.   Ears and Nose: No external deformity. CV: RRR, No M/G/R. No JVD. No thrill. No extra heart sounds. PULM: she has rales especially in the right base. No wheeze. No retractions. No resp. distress. No accessory muscle use. ABD: S, NT, ND, +BS. No rebound. No HSM. EXTR: No c/c/e.  Ankles do not appear swollen NEURO Normal gait.  PSYCH: Normally interactive. Conversant. Not depressed or anxious appearing.  Calm demeanor.   [facility] reading (PRIMARY) by  Dr. Patsy Lager. CXR:  Pulmonary fibrosis. Lateal view may show an acute infiltrate.    CHEST 2 VIEW  COMPARISON: Radiographs 03/31/2013. CT 12/31/2012.  FINDINGS: The heart size and mediastinal contours are stable. Changes of pulmonary fibrosis are again demonstrated with a similar distribution, primarily at the bases, but also involving the peripheral aspect of the right upper lobe. No superimposed airspace disease or significant pleural effusion is seen. There is no pneumothorax. The osseous structures appear unchanged.  IMPRESSION: Stable pulmonary fibrotic changes. No acute cardiopulmonary process.  EKG: NSR, no ST elevation or depression  Her weight is up 4lbs from last month.  However she does not appear to be acutely fluid overloaded and this may be her normal fluctuation.   Wt Readings from Last 3 Encounters:  04/30/13 158 lb (71.668 kg)  03/31/13 154 lb 12.8 oz (70.217 kg)  01/28/13 159 lb 3.2 oz (72.213 kg)   Results for orders placed in visit on 04/30/13  POCT CBC      Result Value Range   WBC 9.7  4.6 - 10.2 K/uL   Lymph, poc 3.7 (*) 0.6 - 3.4   POC LYMPH PERCENT 38.0  10 - 50 %L   MID (cbc) 0.6  0 - 0.9   POC MID % 6.4  0 - 12 %M   POC Granulocyte 5.4  2 - 6.9   Granulocyte percent 55.6  37 - 80 %G   RBC 4.61  4.04 - 5.48 M/uL   Hemoglobin 13.5  12.2 - 16.2 g/dL   HCT, POC 16.1  09.6 - 47.9 %    MCV 94.8  80 - 97 fL   MCH, POC 29.3  27 - 31.2 pg   MCHC 30.9 (*) 31.8 - 35.4 g/dL   RDW, POC 04.5     Platelet Count, POC 178  142 - 424 K/uL   MPV  9.0  0 - 99.8 fL   Assessment and Plan: SOB (shortness of breath) - Plan: DG Chest 2 View, EKG 12-Lead, POCT CBC, Comprehensive metabolic panel, NM Pulmonary Perf and Vent  Pulmonary fibrosis - Plan: NM Pulmonary Perf and Vent  Anticoagulated on Coumadin - Plan: Protime-INR  79 year old woman with history of pulmonary fibrosis and PE diagnosed over the summer.  She is on coumadin.  Check INR today as she was slightly sub- therapeutic at her last check.  VQ scan to evaluate her current clot burden.  Discussed the limits of my work- up here at clinic.  I am certainly happy to facilitate a transfer to the ED for further evaluation today.  She declines this but will seek further care if she gets worse.    Schedule VQ asap, await labs.  Will follow-up with her tomorrow.   Signed Abbe Amsterdam, MD  10/22: called to go over her labs and VQ scan results.   NUCLEAR MEDICINE VENTILATION - PERFUSION LUNG SCAN  TECHNIQUE: Ventilation images were obtained in multiple projections using inhaled aerosol technetium 99 M DTPA. Perfusion images were obtained in multiple projections after intravenous injection of Tc-30m MAA.  COMPARISON: Chest radiograph 04/30/2013, V/Q scan 11/08/2011  RADIOPHARMACEUTICALS: Forty mCi Tc-63m DTPA aerosol and 6 mCi Tc-54m MAA  FINDINGS: Ventilation: There is heterogeneous ventilation particularly at the lung bases which appears slightly worse the and the COMPARISON ventilation scan (although a different radiotracer was utilized).  Perfusion: There is heterogeneous perfusion to the lung bases. Defect at the right lung base posteriorly masses a ventilation defect. Findings are also similar to comparison profusion scan of 11/08/2011.  IMPRESSION: 1. No evidence of acute pulmonary embolism.  2. Heterogeneous  perfusion and ventilation to the lower lobes corresponds with of pulmonary fibrosis and emphysema seen on comparison radiograph.   Results for orders placed in visit on 04/30/13  COMPREHENSIVE METABOLIC PANEL      Result Value Range   Sodium 139  135 - 145 mEq/L   Potassium 3.9  3.5 - 5.3 mEq/L   Chloride 104  96 - 112 mEq/L   CO2 24  19 - 32 mEq/L   Glucose, Bld 80  70 - 99 mg/dL   BUN 7  6 - 23 mg/dL   Creat 1.61  0.96 - 0.45 mg/dL   Total Bilirubin 0.7  0.3 - 1.2 mg/dL   Alkaline Phosphatase 87  39 - 117 U/L   AST 30  0 - 37 U/L   ALT 22  0 - 35 U/L   Total Protein 7.7  6.0 - 8.3 g/dL   Albumin 4.1  3.5 - 5.2 g/dL   Calcium 9.4  8.4 - 40.9 mg/dL  PROTIME-INR      Result Value Range   Prothrombin Time 20.3 (*) 11.6 - 15.2 seconds   INR 1.79 (*) <1.50  POCT CBC      Result Value Range   WBC 9.7  4.6 - 10.2 K/uL   Lymph, poc 3.7 (*) 0.6 - 3.4   POC LYMPH PERCENT 38.0  10 - 50 %L   MID (cbc) 0.6  0 - 0.9   POC MID % 6.4  0 - 12 %M   POC Granulocyte 5.4  2 - 6.9   Granulocyte percent 55.6  37 - 80 %G   RBC 4.61  4.04 - 5.48 M/uL   Hemoglobin 13.5  12.2 - 16.2 g/dL   HCT, POC 81.1  91.4 - 47.9 %  MCV 94.8  80 - 97 fL   MCH, POC 29.3  27 - 31.2 pg   MCHC 30.9 (*) 31.8 - 35.4 g/dL   RDW, POC 16.1     Platelet Count, POC 178  142 - 424 K/uL   MPV 9.0  0 - 99.8 fL   Clot is no long visible- she is happy about this news.  Her INR is a bit low.  Will send a message to her coumadin clinic.  In the meantime she could increase her coumadin by 2.5 or 5mg  total a week.  She will need to continue coumadin long term due to history of multiple PE in the past.

## 2013-04-30 NOTE — Telephone Encounter (Signed)
Please call pt tomorrow and see if we can arrange an ov with me in 3 weeks.  Also need the last rheumatology note from [facility]   thanks

## 2013-04-30 NOTE — Telephone Encounter (Signed)
Spoke with the pt She states having increased SOB for the past 3-4 days She thought that this was due to sinus congestion- using saline rinses with no relief I offered her an appt with TP for 9 am tomorrow (first available with any provider) She refused, stating that she needs to be seen now and states "I will just go to urgent care" and hung up the phone  Will forward to [facility] so that he is aware

## 2013-04-30 NOTE — Telephone Encounter (Signed)
Noted  

## 2013-05-01 ENCOUNTER — Encounter (HOSPITAL_COMMUNITY)
Admission: RE | Admit: 2013-05-01 | Discharge: 2013-05-01 | Disposition: A | Discharge status: Home / Self Care | Payer: Medicare Other | Source: Ambulatory Visit | Attending: Family Medicine | Admitting: Family Medicine

## 2013-05-01 ENCOUNTER — Telehealth: Payer: Self-pay | Admitting: Radiology

## 2013-05-01 DIAGNOSIS — Z86711 Personal history of pulmonary embolism: Secondary | ICD-10-CM | POA: Insufficient documentation

## 2013-05-01 DIAGNOSIS — J449 Chronic obstructive pulmonary disease, unspecified: Secondary | ICD-10-CM | POA: Insufficient documentation

## 2013-05-01 DIAGNOSIS — J4489 Other specified chronic obstructive pulmonary disease: Secondary | ICD-10-CM | POA: Insufficient documentation

## 2013-05-01 DIAGNOSIS — J8410 Pulmonary fibrosis, unspecified: Secondary | ICD-10-CM | POA: Insufficient documentation

## 2013-05-01 DIAGNOSIS — R0602 Shortness of breath: Secondary | ICD-10-CM

## 2013-05-01 MED ORDER — TECHNETIUM TO 99M ALBUMIN AGGREGATED
6.0000 | Freq: Once | INTRAVENOUS | Status: AC | PRN
  Administered 2013-05-01: 6 via INTRAVENOUS

## 2013-05-01 MED ORDER — TECHNETIUM TC 99M DIETHYLENETRIAME-PENTAACETIC ACID: 40.0000 | Freq: Once | INTRAVENOUS | Status: AC | PRN

## 2013-05-01 NOTE — Telephone Encounter (Signed)
Noted  

## 2013-05-01 NOTE — Telephone Encounter (Signed)
VQ Scan today at [facility] 2:45 arrival. Called patient to advise.

## 2013-05-01 NOTE — Telephone Encounter (Signed)
Spoke with pt-- Refused to schedule appt at this time. Patient stated that she will call when she feels ready to schedule.  Records requested from [facility] office--Rheumatology

## 2013-05-07 ENCOUNTER — Ambulatory Visit (INDEPENDENT_AMBULATORY_CARE_PROVIDER_SITE_OTHER): Payer: Medicare Other | Admitting: General Practice

## 2013-05-07 DIAGNOSIS — Z5181 Encounter for therapeutic drug level monitoring: Secondary | ICD-10-CM | POA: Diagnosis not present

## 2013-05-07 DIAGNOSIS — Z7901 Long term (current) use of anticoagulants: Secondary | ICD-10-CM

## 2013-05-07 DIAGNOSIS — I2699 Other pulmonary embolism without acute cor pulmonale: Secondary | ICD-10-CM

## 2013-05-08 ENCOUNTER — Encounter (HOSPITAL_COMMUNITY)
Admission: RE | Admit: 2013-05-08 | Discharge: 2013-05-08 | Disposition: A | Discharge status: Home / Self Care | Payer: Medicare Other | Source: Ambulatory Visit | Attending: Pulmonary Disease | Admitting: Pulmonary Disease

## 2013-05-08 ENCOUNTER — Encounter (HOSPITAL_COMMUNITY): Payer: Self-pay

## 2013-05-08 DIAGNOSIS — J4489 Other specified chronic obstructive pulmonary disease: Secondary | ICD-10-CM | POA: Insufficient documentation

## 2013-05-08 DIAGNOSIS — J449 Chronic obstructive pulmonary disease, unspecified: Secondary | ICD-10-CM | POA: Insufficient documentation

## 2013-05-08 DIAGNOSIS — J84112 Idiopathic pulmonary fibrosis: Secondary | ICD-10-CM | POA: Diagnosis not present

## 2013-05-08 DIAGNOSIS — I2510 Atherosclerotic heart disease of native coronary artery without angina pectoris: Secondary | ICD-10-CM | POA: Diagnosis not present

## 2013-05-08 DIAGNOSIS — Z86718 Personal history of other venous thrombosis and embolism: Secondary | ICD-10-CM | POA: Insufficient documentation

## 2013-05-08 DIAGNOSIS — Z5189 Encounter for other specified aftercare: Secondary | ICD-10-CM | POA: Diagnosis not present

## 2013-05-08 DIAGNOSIS — I2699 Other pulmonary embolism without acute cor pulmonale: Secondary | ICD-10-CM | POA: Diagnosis not present

## 2013-05-08 DIAGNOSIS — M0510 Rheumatoid lung disease with rheumatoid arthritis of unspecified site: Secondary | ICD-10-CM | POA: Insufficient documentation

## 2013-05-08 NOTE — Progress Notes (Signed)
Ms. Eshleman came today for orientation to Pulmonary Rehab.  Well dressed. Very pleasanat lady appearing much younger that her stated age.  Oriented to time and place, color good , skin warm and dry.  She does not appear anxious or depressed.  Oriented to department expectations and goals.  Nurse assessment done, lungs with bilateral crackles in bases, heart rhythm regular with occasional "skipped" beat.  She has noticed this at home occasionally, recently seen by her PCP and recommended to see cardiologist regarding history of hyperchlosterol and family heart disease.  She also has history of DVT and PE and is currently taking Coumadin. Denies and discomfort today, she has had bilateral knee replacements and does not have any problem with ambulation.  Occasionally has some aching.  Advised to notify staff if there is any change when she begins exercise.  Distal pulses are palpable +4 bilateral, mild distal extremitiy edema, non pitting. Safety and hand hygiene in the exercise area reviewed with patient.  Patient voices understanding.Demonstration and practice of PLB using pulse oximeter.  Patient able to return demonstration satisfactorily.  She plans to return tomorrow for 6 min walk test and begin exercise on 05/14/13.  We look forward to working with this very nice lady.  [name] RN

## 2013-05-09 ENCOUNTER — Encounter (HOSPITAL_COMMUNITY)
Admission: RE | Admit: 2013-05-09 | Discharge: 2013-05-09 | Disposition: A | Discharge status: Home / Self Care | Payer: Medicare Other | Source: Ambulatory Visit | Attending: Pulmonary Disease | Admitting: Pulmonary Disease

## 2013-05-14 ENCOUNTER — Encounter (HOSPITAL_COMMUNITY)
Admission: RE | Admit: 2013-05-14 | Discharge: 2013-05-14 | Disposition: A | Discharge status: Home / Self Care | Payer: Medicare Other | Source: Ambulatory Visit | Attending: Pulmonary Disease | Admitting: Pulmonary Disease

## 2013-05-14 DIAGNOSIS — J84112 Idiopathic pulmonary fibrosis: Secondary | ICD-10-CM | POA: Diagnosis not present

## 2013-05-14 DIAGNOSIS — Z86711 Personal history of pulmonary embolism: Secondary | ICD-10-CM | POA: Insufficient documentation

## 2013-05-14 DIAGNOSIS — M0510 Rheumatoid lung disease with rheumatoid arthritis of unspecified site: Secondary | ICD-10-CM | POA: Diagnosis not present

## 2013-05-14 DIAGNOSIS — J449 Chronic obstructive pulmonary disease, unspecified: Secondary | ICD-10-CM | POA: Diagnosis not present

## 2013-05-14 DIAGNOSIS — Z5189 Encounter for other specified aftercare: Secondary | ICD-10-CM | POA: Insufficient documentation

## 2013-05-14 DIAGNOSIS — J4489 Other specified chronic obstructive pulmonary disease: Secondary | ICD-10-CM | POA: Insufficient documentation

## 2013-05-14 NOTE — Progress Notes (Signed)
Today was first exercise session for [name] [name].  She was oriented to equipment use and safety.  Demonstration and practice on each exercise station.  We did use oxygen @ 2L/ min for exercise based on her walk test results.  She was able to maintain oxygen saturations above 90% with exercise. Vital signs stable.   [name] RN

## 2013-05-16 ENCOUNTER — Other Ambulatory Visit: Payer: Self-pay

## 2013-05-16 ENCOUNTER — Encounter (HOSPITAL_COMMUNITY)
Admission: RE | Admit: 2013-05-16 | Discharge: 2013-05-16 | Disposition: A | Discharge status: Home / Self Care | Payer: Medicare Other | Source: Ambulatory Visit | Attending: Pulmonary Disease | Admitting: Pulmonary Disease

## 2013-05-16 DIAGNOSIS — Z86711 Personal history of pulmonary embolism: Secondary | ICD-10-CM | POA: Diagnosis not present

## 2013-05-16 DIAGNOSIS — J449 Chronic obstructive pulmonary disease, unspecified: Secondary | ICD-10-CM | POA: Diagnosis not present

## 2013-05-16 DIAGNOSIS — M0510 Rheumatoid lung disease with rheumatoid arthritis of unspecified site: Secondary | ICD-10-CM | POA: Diagnosis not present

## 2013-05-16 DIAGNOSIS — Z5189 Encounter for other specified aftercare: Secondary | ICD-10-CM | POA: Diagnosis not present

## 2013-05-16 DIAGNOSIS — J84112 Idiopathic pulmonary fibrosis: Secondary | ICD-10-CM | POA: Diagnosis not present

## 2013-05-21 ENCOUNTER — Encounter (HOSPITAL_COMMUNITY): Payer: Medicare Other

## 2013-05-21 ENCOUNTER — Encounter (HOSPITAL_COMMUNITY)
Admission: RE | Admit: 2013-05-21 | Discharge: 2013-05-21 | Disposition: A | Discharge status: Home / Self Care | Payer: Medicare Other | Source: Ambulatory Visit | Attending: Pulmonary Disease | Admitting: Pulmonary Disease

## 2013-05-21 DIAGNOSIS — Z5189 Encounter for other specified aftercare: Secondary | ICD-10-CM | POA: Diagnosis not present

## 2013-05-21 DIAGNOSIS — J449 Chronic obstructive pulmonary disease, unspecified: Secondary | ICD-10-CM | POA: Diagnosis not present

## 2013-05-21 DIAGNOSIS — M0510 Rheumatoid lung disease with rheumatoid arthritis of unspecified site: Secondary | ICD-10-CM | POA: Diagnosis not present

## 2013-05-21 DIAGNOSIS — Z86711 Personal history of pulmonary embolism: Secondary | ICD-10-CM | POA: Diagnosis not present

## 2013-05-21 DIAGNOSIS — J84112 Idiopathic pulmonary fibrosis: Secondary | ICD-10-CM | POA: Diagnosis not present

## 2013-05-23 ENCOUNTER — Encounter (HOSPITAL_COMMUNITY)
Admission: RE | Admit: 2013-05-23 | Discharge: 2013-05-23 | Disposition: A | Discharge status: Home / Self Care | Payer: Medicare Other | Source: Ambulatory Visit | Attending: Pulmonary Disease | Admitting: Pulmonary Disease

## 2013-05-23 DIAGNOSIS — Z5189 Encounter for other specified aftercare: Secondary | ICD-10-CM | POA: Diagnosis not present

## 2013-05-23 DIAGNOSIS — Z86711 Personal history of pulmonary embolism: Secondary | ICD-10-CM | POA: Diagnosis not present

## 2013-05-23 DIAGNOSIS — J449 Chronic obstructive pulmonary disease, unspecified: Secondary | ICD-10-CM | POA: Diagnosis not present

## 2013-05-23 DIAGNOSIS — J84112 Idiopathic pulmonary fibrosis: Secondary | ICD-10-CM | POA: Diagnosis not present

## 2013-05-23 DIAGNOSIS — M0510 Rheumatoid lung disease with rheumatoid arthritis of unspecified site: Secondary | ICD-10-CM | POA: Diagnosis not present

## 2013-05-23 NOTE — Progress Notes (Signed)
[name] [name] 79 y.o. female Nutrition Note Spoke with pt. Pt is overweight. Pt states she wants to lose 6-10 lbs "so my clothes fit better."  Making healthy food choices the majority of the time.  Pt's Rate Your Plate results reviewed with pt. Pt reports she has avoided salty food "for years"; uses canned food "occasionally."  Pt does not add salt to food.  Pt does eat out or pick up food from a restaurant 5 times/week. The role of sodium in lung disease reviewed with pt. Pt states she primarily eats from the salad bar at [employer] [facility] [employer]. Ways to avoid high sodium salad bar choices discussed. According to pt's nutrition screen, pt is allergic to whole milk. Per pt, she is lactose intolerant and uses non-fat lactaid milk. Pt c/o constipation. Pt encouraged to consume more fluid, which pt reports "I know I don't drink enough water." Increasing fiber and fluid intake discussed. Pt taking Coumadin and is aware of the need to follow a diet consistent in Vitamin K intake. Pt expressed understanding of the information reviewed. Nutrition Diagnosis   Food-and nutrition-related knowledge deficit related to lack of exposure to information as related to diagnosis of pulmonary disease   Overweight related to excessive energy intake as evidenced by a BMI of 27.7 Nutrition Rx/Est. Daily Nutrition Needs for: ? wt loss 1250 Kcal  90-105 gm protein   2000 mg or less sodium      Nutrition Intervention   Pt's individual nutrition plan and goals reviewed with pt.   Benefits of adopting healthy eating habits discussed when pt's Rate Your Plate reviewed.   Handouts given for: Eating Out   Pt to attend the Nutrition and Lung Disease class   Continual client-centered nutrition education by RD, as part of interdisciplinary care. Goal(s) 1. Identify food quantities necessary to achieve wt loss of  -2# per week to a goal wt of 61.2-69.4 kg (134-152 lb) at graduation from pulmonary rehab. Monitor and Evaluate  progress toward nutrition goal with team.   Mickle Plumb, M.Ed, RD, LDN, CDE 05/23/2013 12:25 PM

## 2013-05-28 ENCOUNTER — Encounter (HOSPITAL_COMMUNITY)
Admission: RE | Admit: 2013-05-28 | Discharge: 2013-05-28 | Disposition: A | Discharge status: Home / Self Care | Payer: Medicare Other | Source: Ambulatory Visit | Attending: Pulmonary Disease | Admitting: Pulmonary Disease

## 2013-05-28 DIAGNOSIS — J449 Chronic obstructive pulmonary disease, unspecified: Secondary | ICD-10-CM | POA: Diagnosis not present

## 2013-05-28 DIAGNOSIS — Z5189 Encounter for other specified aftercare: Secondary | ICD-10-CM | POA: Diagnosis not present

## 2013-05-28 DIAGNOSIS — J84112 Idiopathic pulmonary fibrosis: Secondary | ICD-10-CM | POA: Diagnosis not present

## 2013-05-28 DIAGNOSIS — Z86711 Personal history of pulmonary embolism: Secondary | ICD-10-CM | POA: Diagnosis not present

## 2013-05-28 DIAGNOSIS — M0510 Rheumatoid lung disease with rheumatoid arthritis of unspecified site: Secondary | ICD-10-CM | POA: Diagnosis not present

## 2013-05-30 ENCOUNTER — Encounter (HOSPITAL_COMMUNITY)
Admission: RE | Admit: 2013-05-30 | Discharge: 2013-05-30 | Disposition: A | Discharge status: Home / Self Care | Payer: Medicare Other | Source: Ambulatory Visit | Attending: Pulmonary Disease | Admitting: Pulmonary Disease

## 2013-05-30 DIAGNOSIS — J84112 Idiopathic pulmonary fibrosis: Secondary | ICD-10-CM | POA: Diagnosis not present

## 2013-05-30 DIAGNOSIS — Z86711 Personal history of pulmonary embolism: Secondary | ICD-10-CM | POA: Diagnosis not present

## 2013-05-30 DIAGNOSIS — M0510 Rheumatoid lung disease with rheumatoid arthritis of unspecified site: Secondary | ICD-10-CM | POA: Diagnosis not present

## 2013-05-30 DIAGNOSIS — Z5189 Encounter for other specified aftercare: Secondary | ICD-10-CM | POA: Diagnosis not present

## 2013-05-30 DIAGNOSIS — J449 Chronic obstructive pulmonary disease, unspecified: Secondary | ICD-10-CM | POA: Diagnosis not present

## 2013-06-04 ENCOUNTER — Ambulatory Visit (INDEPENDENT_AMBULATORY_CARE_PROVIDER_SITE_OTHER): Payer: Medicare Other | Admitting: General Practice

## 2013-06-04 ENCOUNTER — Encounter (HOSPITAL_COMMUNITY)
Admission: RE | Admit: 2013-06-04 | Discharge: 2013-06-04 | Disposition: A | Discharge status: Home / Self Care | Payer: Medicare Other | Source: Ambulatory Visit | Attending: Pulmonary Disease | Admitting: Pulmonary Disease

## 2013-06-04 DIAGNOSIS — J449 Chronic obstructive pulmonary disease, unspecified: Secondary | ICD-10-CM | POA: Diagnosis not present

## 2013-06-04 DIAGNOSIS — J84112 Idiopathic pulmonary fibrosis: Secondary | ICD-10-CM | POA: Diagnosis not present

## 2013-06-04 DIAGNOSIS — Z5189 Encounter for other specified aftercare: Secondary | ICD-10-CM | POA: Diagnosis not present

## 2013-06-04 DIAGNOSIS — I2699 Other pulmonary embolism without acute cor pulmonale: Secondary | ICD-10-CM

## 2013-06-04 DIAGNOSIS — Z7901 Long term (current) use of anticoagulants: Secondary | ICD-10-CM

## 2013-06-04 DIAGNOSIS — Z86711 Personal history of pulmonary embolism: Secondary | ICD-10-CM | POA: Diagnosis not present

## 2013-06-04 DIAGNOSIS — M0510 Rheumatoid lung disease with rheumatoid arthritis of unspecified site: Secondary | ICD-10-CM | POA: Diagnosis not present

## 2013-06-04 LAB — POCT INR: INR: 1.9

## 2013-06-04 NOTE — Progress Notes (Signed)
Pre-visit discussion using our clinic review tool. No additional management support is needed unless otherwise documented below in the visit note.  

## 2013-06-06 ENCOUNTER — Encounter (HOSPITAL_COMMUNITY): Payer: Medicare Other

## 2013-06-11 ENCOUNTER — Encounter (HOSPITAL_COMMUNITY)
Admission: RE | Admit: 2013-06-11 | Discharge: 2013-06-11 | Disposition: A | Discharge status: Home / Self Care | Payer: Medicare Other | Source: Ambulatory Visit | Attending: Pulmonary Disease | Admitting: Pulmonary Disease

## 2013-06-11 ENCOUNTER — Encounter (HOSPITAL_COMMUNITY): Payer: Self-pay

## 2013-06-11 DIAGNOSIS — J449 Chronic obstructive pulmonary disease, unspecified: Secondary | ICD-10-CM | POA: Insufficient documentation

## 2013-06-11 DIAGNOSIS — Z5189 Encounter for other specified aftercare: Secondary | ICD-10-CM | POA: Insufficient documentation

## 2013-06-11 DIAGNOSIS — J84112 Idiopathic pulmonary fibrosis: Secondary | ICD-10-CM | POA: Insufficient documentation

## 2013-06-11 DIAGNOSIS — M0510 Rheumatoid lung disease with rheumatoid arthritis of unspecified site: Secondary | ICD-10-CM | POA: Diagnosis not present

## 2013-06-11 DIAGNOSIS — Z86711 Personal history of pulmonary embolism: Secondary | ICD-10-CM | POA: Insufficient documentation

## 2013-06-11 DIAGNOSIS — J4489 Other specified chronic obstructive pulmonary disease: Secondary | ICD-10-CM | POA: Insufficient documentation

## 2013-06-11 NOTE — Progress Notes (Signed)
[name] [name] reported today at Pulmonary Rehab that she has been having some chest pressure that comes on with exertion.  She is noticing this to be more frequent.  Relieved with rest.  She is uncertain if this is related to her Pulmonary fibrosis or could be possible heart related.  She does state family history and note per Dr Sanjuana Kava of cath in 2008 which did show some mild blockage at that time.  We discussed this at length and decided she should see cardiologist for evaluation before we proceed with Pulmonary Rehab.  She did have some chest discomfort, pressure today while walking track.  Relieved with rest.  We have used oxygen for her exercise due to saturations in upper 80 to low 90 range with exercise.  Her appointment is for Monday Dec 8.  She has instructions for calling 911 if she has persistent discomfort and her MD if this seems to worsen before Monday.   [name] RN

## 2013-06-11 NOTE — Progress Notes (Signed)
I have reviewed a Home Exercise Program with the patient.  They will be walking around [facility] for their exercise.  [name] [name] is not currently doing any exercise at home.  Patients home exercise prescription will be walking 3 days a week for 25 minutes.  The patient and I discussed the progression of her exercise prescription.  The patient states that her goals are to feel better on a day to day basis and increase energy.  We reviewed exercise guidelines, target heart rate during exercise, oxygen use, weather, home pulse oximeter, endpoints for exercise, and goals.  Patient has stated that they understand and are encouraged to come to me with any questions.

## 2013-06-13 ENCOUNTER — Encounter (HOSPITAL_COMMUNITY): Payer: Medicare Other

## 2013-06-17 ENCOUNTER — Ambulatory Visit (INDEPENDENT_AMBULATORY_CARE_PROVIDER_SITE_OTHER): Payer: Medicare Other | Admitting: Nurse Practitioner

## 2013-06-17 ENCOUNTER — Inpatient Hospital Stay (HOSPITAL_COMMUNITY): Payer: Medicare Other

## 2013-06-17 ENCOUNTER — Other Ambulatory Visit: Payer: Self-pay | Admitting: *Deleted

## 2013-06-17 ENCOUNTER — Inpatient Hospital Stay (HOSPITAL_COMMUNITY)
Admission: AD | Admit: 2013-06-17 | Discharge: 2013-06-19 | DRG: 287 | Disposition: A | Discharge status: Home / Self Care | Payer: Medicare Other | Source: Ambulatory Visit | Attending: Cardiovascular Disease | Admitting: Cardiovascular Disease

## 2013-06-17 ENCOUNTER — Encounter: Payer: Self-pay | Admitting: Nurse Practitioner

## 2013-06-17 ENCOUNTER — Encounter (HOSPITAL_COMMUNITY): Payer: Self-pay | Admitting: *Deleted

## 2013-06-17 VITALS — BP 110/60 | HR 88 | Ht 64.5 in | Wt 156.1 lb

## 2013-06-17 DIAGNOSIS — Z86718 Personal history of other venous thrombosis and embolism: Secondary | ICD-10-CM | POA: Diagnosis not present

## 2013-06-17 DIAGNOSIS — R079 Chest pain, unspecified: Secondary | ICD-10-CM | POA: Diagnosis not present

## 2013-06-17 DIAGNOSIS — I200 Unstable angina: Secondary | ICD-10-CM | POA: Diagnosis not present

## 2013-06-17 DIAGNOSIS — E785 Hyperlipidemia, unspecified: Secondary | ICD-10-CM

## 2013-06-17 DIAGNOSIS — Z7901 Long term (current) use of anticoagulants: Secondary | ICD-10-CM | POA: Diagnosis not present

## 2013-06-17 DIAGNOSIS — I2510 Atherosclerotic heart disease of native coronary artery without angina pectoris: Secondary | ICD-10-CM | POA: Diagnosis not present

## 2013-06-17 DIAGNOSIS — R791 Abnormal coagulation profile: Secondary | ICD-10-CM | POA: Diagnosis present

## 2013-06-17 DIAGNOSIS — K219 Gastro-esophageal reflux disease without esophagitis: Secondary | ICD-10-CM | POA: Diagnosis present

## 2013-06-17 DIAGNOSIS — R0789 Other chest pain: Secondary | ICD-10-CM | POA: Diagnosis not present

## 2013-06-17 LAB — COMPREHENSIVE METABOLIC PANEL
ALT: 17 U/L (ref 0–35)
AST: 25 U/L (ref 0–37)
Albumin: 3.2 g/dL — ABNORMAL LOW (ref 3.5–5.2)
Alkaline Phosphatase: 78 U/L (ref 39–117)
BUN: 15 mg/dL (ref 6–23)
CO2: 26 mEq/L (ref 19–32)
Calcium: 9.2 mg/dL (ref 8.4–10.5)
Chloride: 106 mEq/L (ref 96–112)
Creatinine, Ser: 0.82 mg/dL (ref 0.50–1.10)
GFR calc Af Amer: 76 mL/min — ABNORMAL LOW (ref >90)
GFR calc non Af Amer: 66 mL/min — ABNORMAL LOW (ref >90)
Glucose, Bld: 81 mg/dL (ref 70–99)
Potassium: 4.1 mEq/L (ref 3.5–5.1)
Sodium: 141 mEq/L (ref 135–145)
Total Bilirubin: 0.6 mg/dL (ref 0.3–1.2)
Total Protein: 7.1 g/dL (ref 6.0–8.3)

## 2013-06-17 LAB — CBC WITH DIFFERENTIAL/PLATELET
Basophils Absolute: 0 10*3/uL (ref 0.0–0.1)
Basophils Relative: 0 % (ref 0–1)
Eosinophils Absolute: 0.2 10*3/uL (ref 0.0–0.7)
Eosinophils Relative: 2 % (ref 0–5)
HCT: 39.1 % (ref 36.0–46.0)
Hemoglobin: 13.3 g/dL (ref 12.0–15.0)
Lymphocytes Relative: 35 % (ref 12–46)
Lymphs Abs: 3.4 10*3/uL (ref 0.7–4.0)
MCH: 30.5 pg (ref 26.0–34.0)
MCHC: 34 g/dL (ref 30.0–36.0)
MCV: 89.7 fL (ref 78.0–100.0)
Monocytes Absolute: 1 10*3/uL (ref 0.1–1.0)
Monocytes Relative: 11 % (ref 3–12)
Neutro Abs: 5 10*3/uL (ref 1.7–7.7)
Neutrophils Relative %: 52 % (ref 43–77)
Platelets: 189 10*3/uL (ref 150–400)
RBC: 4.36 MIL/uL (ref 3.87–5.11)
RDW: 13.7 % (ref 11.5–15.5)
WBC: 9.7 10*3/uL (ref 4.0–10.5)

## 2013-06-17 LAB — TROPONIN I
Troponin I: <0.3 ng/mL (ref <0.30)
Troponin I: <0.3 ng/mL (ref <0.30)
Troponin I: <0.3 ng/mL (ref <0.30)

## 2013-06-17 LAB — PROTIME-INR
INR: 1.66 — ABNORMAL HIGH (ref 0.00–1.49)
Prothrombin Time: 19.1 seconds — ABNORMAL HIGH (ref 11.6–15.2)

## 2013-06-17 LAB — TSH: TSH: 3.156 u[IU]/mL (ref 0.350–4.500)

## 2013-06-17 LAB — APTT: aPTT: 36 seconds (ref 24–37)

## 2013-06-17 LAB — MAGNESIUM: Magnesium: 1.8 mg/dL (ref 1.5–2.5)

## 2013-06-17 MED ORDER — PNEUMOCOCCAL VAC POLYVALENT 25 MCG/0.5ML IJ INJ: 0.5000 mL | INJECTION | INTRAMUSCULAR | Status: DC | PRN

## 2013-06-17 MED ORDER — SODIUM CHLORIDE 0.9 % IV SOLN
INTRAVENOUS | Status: DC
  Administered 2013-06-17: 500 mL via INTRAVENOUS

## 2013-06-17 MED ORDER — ACETAMINOPHEN 325 MG PO TABS: 650.0000 mg | ORAL_TABLET | ORAL | Status: DC | PRN

## 2013-06-17 MED ORDER — ASPIRIN 300 MG RE SUPP
300.0000 mg | RECTAL | Status: DC
  Filled 2013-06-17: qty 1

## 2013-06-17 MED ORDER — HEPARIN (PORCINE) IN NACL 100-0.45 UNIT/ML-% IJ SOLN
950.0000 [IU]/h | INTRAMUSCULAR | Status: DC
  Administered 2013-06-17: 850 [IU]/h via INTRAVENOUS
  Administered 2013-06-17: 950 [IU]/h via INTRAVENOUS
  Filled 2013-06-17 (×2): qty 250

## 2013-06-17 MED ORDER — NITROGLYCERIN 0.4 MG SL SUBL: 0.4000 mg | SUBLINGUAL_TABLET | SUBLINGUAL | Status: DC | PRN

## 2013-06-17 MED ORDER — ASPIRIN EC 81 MG PO TBEC
81.0000 mg | DELAYED_RELEASE_TABLET | Freq: Every day | ORAL | Status: DC
  Filled 2013-06-17: qty 1

## 2013-06-17 MED ORDER — ASPIRIN 81 MG PO CHEW: 324.0000 mg | CHEWABLE_TABLET | ORAL | Status: DC

## 2013-06-17 MED ORDER — ONDANSETRON HCL 4 MG/2ML IJ SOLN: 4.0000 mg | Freq: Four times a day (QID) | INTRAMUSCULAR | Status: DC | PRN

## 2013-06-17 MED ORDER — METOPROLOL TARTRATE 25 MG PO TABS
25.0000 mg | ORAL_TABLET | Freq: Two times a day (BID) | ORAL | Status: DC
  Administered 2013-06-17 – 2013-06-18 (×4): 25 mg via ORAL
  Filled 2013-06-17 (×5): qty 1

## 2013-06-17 MED ORDER — NITROGLYCERIN IN D5W 200-5 MCG/ML-% IV SOLN: 3.0000 ug/min | INTRAVENOUS | Status: DC

## 2013-06-17 MED ORDER — INFLUENZA VAC SPLIT QUAD 0.5 ML IM SUSP: 0.5000 mL | INTRAMUSCULAR | Status: DC | PRN

## 2013-06-17 NOTE — Progress Notes (Signed)
ANTICOAGULATION CONSULT NOTE - Initial Consult  Pharmacy Consult:  Heparin Indication:  ACS (start when INR < 2)  Allergies  Allergen Reactions  . Crestor [Rosuvastatin]     Muscle pain  . Niacin Hives  . Penicillins Hives    Patient Measurements: Height: 5\' 4"  (162.6 cm) Weight: 156 lb 1.9 oz (70.816 kg) IBW/kg (Calculated) : 54.7 Heparin Dosing Weight: 69 kg  Vital Signs: BP: 103/50 mmHg (12/08 1000) Pulse Rate: 70 (12/08 1000)  Labs: No results found for this basename: HGB, HCT, PLT, APTT, LABPROT, INR, HEPARINUNFRC, CREATININE, CKTOTAL, CKMB, TROPONINI,  in the last 72 hours  Estimated Creatinine Clearance: 54.1 ml/min (by C-G formula based on Cr of 0.78).   Medical History: Past Medical History  Diagnosis Date  . Allergic rhinitis   . History of pulmonary embolism   . DVT (deep venous thrombosis)   . Dyslipidemia   . Pulmonary fibrosis   . DJD (degenerative joint disease)   . Osteoporosis   . CAD (coronary artery disease)     Mild CAD by cath 2008       Assessment: 82 YOF directly admitted from MD office with chest pain.  Patient is on Coumadin PTA for history of PE/DVT.  Pharmacy consulted to manage IV heparin once INR < 2 and INR is 1.66.  No bleeding reported.   Goal of Therapy:  Heparin level 0.3-0.7 units/ml Monitor platelets by anticoagulation protocol: Yes    Plan:  - Heparin gtt at 850 units/hr, no bolus - Check 8 hr HL - Daily HL / CBC    [name] [name] D. Laney Potash, PharmD, BCPS Pager:  [phone] 06/17/2013, 12:25 PM

## 2013-06-17 NOTE — Progress Notes (Signed)
ANTICOAGULATION CONSULT NOTE - Follow Up Consult  Pharmacy Consult for heparin Indication: chest pain/ACS  Allergies  Allergen Reactions  . Crestor [Rosuvastatin]     Muscle pain  . Lactose Intolerance (Gi)     Stomach upset  . Niacin Hives  . Penicillins Hives    Patient Measurements: Height: 5\' 4"  (162.6 cm) Weight: 156 lb 1.9 oz (70.816 kg) IBW/kg (Calculated) : 54.7 Heparin Dosing Weight: 69 kg  Vital Signs: Temp: 97.9 F (36.6 C) (12/08 2000) Temp src: Oral (12/08 2000) BP: 132/75 mmHg (12/08 2120) Pulse Rate: 68 (12/08 2120)  Labs:  Recent Labs  06/17/13 0949 06/17/13 1030 06/17/13 1539  HGB  --  13.3  --   HCT  --  39.1  --   PLT  --  189  --   APTT  --  36  --   LABPROT  --  19.1*  --   INR  --  1.66*  --   CREATININE  --  0.82  --   TROPONINI <0.30  --  <0.30    Estimated Creatinine Clearance: 52.8 ml/min (by C-G formula based on Cr of 0.82).   Medications:  Scheduled:  . aspirin  324 mg Oral NOW   Or  . aspirin  300 mg Rectal NOW  . [START ON 06/18/2013] aspirin EC  81 mg Oral Daily  . metoprolol tartrate  25 mg Oral BID   Infusions:  . sodium chloride 500 mL (06/17/13 1345)  . heparin 850 Units/hr (06/17/13 1345)  . nitroGLYCERIN      Assessment: 79 yo female with ACS is currently on subtherapeutic heparin.  Heparin level is 0.22. Goal of Therapy:  Heparin level 0.3-0.7 units/ml Monitor platelets by anticoagulation protocol: Yes   Plan:  1) Increase heparin drip to 950 units/hr. (Patient was therapeutic at this rate 5 months ago).  8hr heparin level. 2) Daily heparin level and CBC  Ezequias Lard, Tsz-Yin 06/17/2013,9:51 PM

## 2013-06-17 NOTE — Progress Notes (Signed)
IV team paged twice for IV start. [facility] paged twice regarding patient admission. Awaiting return phone calls.  Will continue to monitor.   - Alwyn Ren, RN

## 2013-06-17 NOTE — Progress Notes (Signed)
Called to see pt re: chest pain  BP 108/58  Pulse 74  Temp(Src) 97.1 F (36.2 C) (Oral)  Resp 21  Ht 5\' 4"  (1.626 m)  Wt 156 lb 1.9 oz (70.816 kg)  BMI 26.78 kg/m2  SpO2 97%  Pt c/o upper left chest pain.  It has improved.  Upper left chest is tender to palpation.   Initial enzymes are negative, other labs OK. INR 1.66, heparin being started.  IV NTG ordered, but SBP 90s now and pt not acutely uncomfortable, OK to hold.   Continue to follow.

## 2013-06-17 NOTE — Care Management Note (Signed)
    Page 1 of 1   06/17/2013     3:06:12 PM   CARE MANAGEMENT NOTE 06/17/2013  Patient:  ADY, HEIMANN   Account Number:  [id number]  Date Initiated:  06/17/2013  Documentation initiated by:  Junius Creamer  Subjective/Objective Assessment:   adm w angina     Action/Plan:   lives alone, pcp dr Jonny Ruiz griffin   Anticipated DC Date:     Anticipated DC Plan:           Choice offered to / List presented to:             Status of service:   Medicare Important Message given?   (If response is "NO", the following Medicare IM given date fields will be blank) Date Medicare IM given:   Date Additional Medicare IM given:    Discharge Disposition:    Per UR Regulation:  Reviewed for med. necessity/level of care/duration of stay  If discussed at Long Length of Stay Meetings, dates discussed:    Comments:

## 2013-06-17 NOTE — Progress Notes (Addendum)
[name] [name] Date of Birth: 1931-04-30 Medical Record #[id number]  History of Present Illness:  Ms. [name] [name] is seen back today for a work in visit. Seen for Dr. Clifton James. 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 Last Myoview in August of 2013 showing normal LV function and no ischemia. Echo August of 2013 with normal LV size and function and no significant valvular abnormalities.  Last seen here in September of 2013. Was felt to be stable. She attends pulmonary rehab - endorsed chest pressure with exertion - referred back here.   Comes in today. Here alone. Has 2 complaints. Notes some lower right back pain - worse with moving/twisting/bending. Also with some left upper chest pain above the left breast - typically brought on by exertion - with worsening shortness of breath - but today actually has at rest. No NTG use. Some radiation down the left arm. Remains on her coumadin - took her dose last night.   Current Outpatient Prescriptions  Medication Sig Dispense Refill  . ALPRAZolam (XANAX) 0.5 MG tablet Take 0.5 mg by mouth as needed. For anxiety      . fluticasone (FLONASE) 50 MCG/ACT nasal spray Place 2 sprays into the nose daily.  16 g  11  . Multiple Vitamin (MULTIVITAMIN) capsule Take 1 capsule by mouth daily.        . Probiotic Product (PROBIOTIC DAILY PO) Take by mouth daily.      [name] warfarin (COUMADIN) 5 MG tablet Take 1 tablet (5 mg total) by mouth one time only at 6 PM.  60 tablet  3  . nitroGLYCERIN (NITROSTAT) 0.4 MG SL tablet Place 1 tablet (0.4 mg total) under the tongue every 5 (five) minutes as needed for chest pain.  25 tablet  6   No current facility-administered medications for this  visit.   Facility-Administered Medications Ordered in Other Visits  Medication Dose Route Frequency Provider Last Rate Last Dose  . 0.9 %  sodium chloride infusion  250 mL Intravenous PRN Lupita Leash, MD        Allergies  Allergen Reactions  . Crestor [Rosuvastatin]     Muscle pain  . Niacin Hives  . Penicillins Hives    Past Medical History  Diagnosis Date  . Allergic rhinitis   . History of pulmonary embolism   . DVT (deep venous thrombosis)   . Dyslipidemia   . Pulmonary fibrosis   . DJD (degenerative joint disease)   . Osteoporosis   . CAD (coronary artery disease)     Mild CAD by cath 2008    Past Surgical History  Procedure Laterality Date  . Abdominal hysterectomy    . Appendectomy    . Total knee arthroplasty  1997    Bilateral  . Tonsillectomy      History  Smoking status  . Former Smoker -- 0.30 packs/day for 20 years  . Types: Cigarettes  . Quit date: 07/11/1973  Smokeless tobacco  . Never Used    History  Alcohol Use  . 0.0 oz/week    Comment: approx 1 glass wine per week    Family History  Problem Relation Age of Onset  . Kidney disease Daughter 5  . Cancer Daughter   . Heart  attack Father 62  . Alzheimer's disease Sister     Review of Systems: The review of systems is per the HPI.  All other systems were reviewed and are negative.  Physical Exam: Pulse 98  Ht 5' 4.5" (1.638 m)  Wt 156 lb 1.9 oz (70.816 kg)  BMI 26.39 kg/m2  SpO2 89% Bp by me is 110/60. Oxygen sat by me is 95% at rest. Patient is very pleasant and in no acute distress. Skin is warm and dry. Color is normal.  HEENT is unremarkable. Normocephalic/atraumatic. PERRL. Sclera are nonicteric. Neck is supple. No masses. No JVD. Lungs are clear. Cardiac exam shows a regular rate and rhythm. Abdomen is soft. Extremities are without edema. Gait and ROM are intact. No gross neurologic deficits noted.  LABORATORY DATA: EKG here today shows sinus rhythm. Some slight ST  elevation in the lateral leads - this tracing has been reviewed with Dr. Shirlee Latch - he does NOT feel this is a STEMI   Lab Results  Component Value Date   WBC 9.7 04/30/2013   HGB 13.5 04/30/2013   HCT 43.7 04/30/2013   PLT 183 01/02/2013   GLUCOSE 80 04/30/2013   ALT 22 04/30/2013   AST 30 04/30/2013   NA 139 04/30/2013   K 3.9 04/30/2013   CL 104 04/30/2013   CREATININE 0.78 04/30/2013   BUN 7 04/30/2013   CO2 24 04/30/2013   INR 1.9 06/04/2013    Lab Results  Component Value Date   INR 1.9 06/04/2013   INR 2.6 05/07/2013   INR 1.79* 04/30/2013    Echo Study Conclusions from August 2013  - Left ventricle: The cavity size was normal. Wall thickness was normal. Systolic function was normal. The estimated ejection fraction was in the range of 55% to 60%. Wall motion was normal; there were no regional wall motion abnormalities. Doppler parameters are consistent with abnormal left ventricular relaxation (grade 1 diastolic dysfunction). - Aortic valve: There was no stenosis. - Mitral valve: No significant regurgitation. - Right ventricle: The cavity size was normal. Systolic function was mildly reduced. - Pulmonary arteries: PA peak pressure: 26mm Hg (S). - Inferior vena cava: The vessel was normal in size; the respirophasic diameter changes were in the normal range (= 50%); findings are consistent with normal central venous pressure.  Myoview Impression from August 2013  Exercise Capacity: Poor exercise capacity.  BP Response: Normal blood pressure response.  Clinical Symptoms: No chest pain.  ECG Impression: No significant ST segment change suggestive of ischemia.  Comparison with Prior Nuclear Study: No images to compare  Overall Impression: Normal stress nuclear study.  LV Ejection Fraction: 85%. LV Wall Motion: NL LV Function; NL Wall Motion    Assessment / Plan: 1. Chest pain - known CAD - nonobstructive per cath back in 2008 - now with exertional symptoms but  also with symptoms at rest - I have given her NTG x 1 with complete relief. BP down to 80/60.  Talked with Dr. Shirlee Latch - will proceed with admission to the hospital - send per EMS - needs labs - I have not scheduled her cath due to not knowing the status of her INR.  2. Chronic dyspnea/pulmonary fibrosis - followed by Dr. Shelle Iron  3. HLD - statin intolerance  4. DVT/PE - on chronic anticoagulation   Sending on to [facility] per EMS. Further disposition to follow. Painfree at present.   Patient is agreeable to this plan and will call if any problems develop in the  interim.   Rosalio Macadamia, RN, ANP-C [facility] [address] [place], [place]  [address]  Patient discussed with NP Tyrone Sage, agree with transport via EMS to ER for further evaluation with chest pain at rest (now resolved).   Marca Ancona 06/17/2013

## 2013-06-17 NOTE — Patient Instructions (Signed)
We are admitting you to the hospital this morning

## 2013-06-17 NOTE — Progress Notes (Signed)
Patient ID: [name] [name], female   DOB: May 30, 1931, 79 y.o.   MRN: [id number]

## 2013-06-17 NOTE — H&P (Signed)
Nieshia Larmon  06/17/2013 8:00 AM   Office Visit  MRN:  [id number]   Description: 79 year old female  Provider: Rosalio Macadamia, NP  Department: [facility]        Referring Provider    Kathleene Hazel, MD      Diagnoses    CAD (coronary artery disease)    -  Primary    414.00      Reason for Visit    Chest Pain    Work in visit. Seen for Dr. Clifton James.          Progress Notes    Rosalio Macadamia, NP at 06/17/2013  7:58 AM    Status: Signed               Kathline Magic Date of Birth: 1931/03/18 Medical Record #[id number]   History of Present Illness:   Ms. Romanek is seen back today for a work in visit. Seen for Dr. Clifton James. 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 Last Myoview in August of 2013 showing normal LV function and no ischemia. Echo August of 2013 with normal LV size and function and no significant valvular abnormalities.   Last seen here in September of 2013. Was felt to be stable. She attends pulmonary rehab - endorsed chest pressure with exertion - referred back here.    Comes in today. Here alone. Has 2 complaints. Notes some lower right back pain - worse with moving/twisting/bending. Also with some left upper chest pain above the left breast - typically brought on by exertion - with worsening shortness of breath - but today actually has at rest. No NTG use. Some radiation down the left arm. Remains on her coumadin - took her dose last night.     Current Outpatient Prescriptions   Medication  Sig  Dispense  Refill   .  ALPRAZolam (XANAX) 0.5 MG tablet  Take 0.5 mg by mouth as needed. For anxiety         .  fluticasone (FLONASE) 50 MCG/ACT nasal  spray  Place 2 sprays into the nose daily.   16 g   11   .  Multiple Vitamin (MULTIVITAMIN) capsule  Take 1 capsule by mouth daily.           .  Probiotic Product (PROBIOTIC DAILY PO)  Take by mouth daily.         [name]  warfarin (COUMADIN) 5 MG tablet  Take 1 tablet (5 mg total) by mouth one time only at 6 PM.   60 tablet   3   .  nitroGLYCERIN (NITROSTAT) 0.4 MG SL tablet  Place 1 tablet (0.4 mg total) under the tongue every 5 (five) minutes as needed for chest pain.   25 tablet   6       No current facility-administered medications for this visit.       Facility-Administered Medications Ordered in Other Visits   Medication  Dose  Route  Frequency  Provider  Last Rate  Last Dose   .  0.9 %  sodium chloride infusion   250 mL  Intravenous  PRN  Lupita Leash, MD  Allergies   Allergen  Reactions   .  Crestor [Rosuvastatin]         Muscle pain   .  Niacin  Hives   .  Penicillins  Hives         Past Medical History   Diagnosis  Date   .  Allergic rhinitis     .  History of pulmonary embolism     .  DVT (deep venous thrombosis)     .  Dyslipidemia     .  Pulmonary fibrosis     .  DJD (degenerative joint disease)     .  Osteoporosis     .  CAD (coronary artery disease)         Mild CAD by cath 2008         Past Surgical History   Procedure  Laterality  Date   .  Abdominal hysterectomy       .  Appendectomy       .  Total knee arthroplasty    1997       Bilateral   .  Tonsillectomy             History   Smoking status   .  Former Smoker -- 0.30 packs/day for 20 years   .  Types:  Cigarettes   .  Quit date:  07/11/1973   Smokeless tobacco   .  Never Used         History   Alcohol Use   .  0.0 oz/week       Comment: approx 1 glass wine per week         Family History   Problem  Relation  Age of Onset   .  Kidney disease  Daughter  5   .  Cancer  Daughter     .  Heart attack  Father  19   .  Alzheimer's disease  Sister           Review of Systems: The review of systems is per the HPI.  All other systems were reviewed and are negative.   Physical Exam: Pulse 98  Ht 5' 4.5" (1.638 m)  Wt 156 lb 1.9 oz (70.816 kg)  BMI 26.39 kg/m2  SpO2 89% Bp by me is 110/60. Oxygen sat by me is 95% at rest. Patient is very pleasant and in no acute distress. Skin is warm and dry. Color is normal.  HEENT is unremarkable. Normocephalic/atraumatic. PERRL. Sclera are nonicteric. Neck is supple. No masses. No JVD. Lungs are clear. Cardiac exam shows a regular rate and rhythm. Abdomen is soft. Extremities are without edema. Gait and ROM are intact. No gross neurologic deficits noted.   LABORATORY DATA: EKG here today shows sinus rhythm. Some slight ST elevation in the lateral leads - this tracing has been reviewed with Dr. Shirlee Latch - he does NOT feel this is a STEMI     Lab Results   Component  Value  Date     WBC  9.7  04/30/2013     HGB  13.5  04/30/2013     HCT  43.7  04/30/2013     PLT  183  01/02/2013     GLUCOSE  80  04/30/2013     ALT  22  04/30/2013     AST  30  04/30/2013     NA  139  04/30/2013     K  3.9  04/30/2013  CL  104  04/30/2013     CREATININE  0.78  04/30/2013     BUN  7  04/30/2013     CO2  24  04/30/2013     INR  1.9  06/04/2013         Lab Results   Component  Value  Date     INR  1.9  06/04/2013     INR  2.6  05/07/2013     INR  1.79*  04/30/2013        Echo Study Conclusions from August 2013  - Left ventricle: The cavity size was normal. Wall thickness was normal. Systolic function was normal. The estimated ejection fraction was in the range of 55% to 60%. Wall motion was normal; there were no regional wall motion abnormalities. Doppler parameters are consistent with abnormal left ventricular relaxation (grade 1 diastolic dysfunction). - Aortic valve: There was no stenosis. - Mitral valve: No significant regurgitation. - Right ventricle: The cavity size was normal.  Systolic function was mildly reduced. - Pulmonary arteries: PA peak pressure: 26mm Hg (S). - Inferior vena cava: The vessel was normal in size; the respirophasic diameter changes were in the normal range (= 50%); findings are consistent with normal central venous pressure.   Myoview Impression from August 2013   Exercise Capacity: Poor exercise capacity.   BP Response: Normal blood pressure response.   Clinical Symptoms: No chest pain.   ECG Impression: No significant ST segment change suggestive of ischemia.   Comparison with Prior Nuclear Study: No images to compare   Overall Impression: Normal stress nuclear study.   LV Ejection Fraction: 85%. LV Wall Motion: NL LV Function; NL Wall Motion      Assessment / Plan:  1. Chest pain - known CAD - nonobstructive per cath back in 2008 - now with exertional symptoms but also with symptoms at rest - I have given her NTG x 1 with complete relief. BP down to 80/60.  Reviewed with Dr. Shirlee Latch who is the office DOD. will proceed with admission to the hospital - send per EMS - needs labs - I have not scheduled her cath due to not knowing the status of her INR.   2. Chronic dyspnea/pulmonary fibrosis - followed by Dr. Shelle Iron   3. HLD - statin intolerance   4. DVT/PE - on chronic anticoagulation    Sending on to [facility] per EMS. Further disposition to follow. Painfree at present.    Patient is agreeable to this plan and will call if any problems develop in the interim.    Rosalio Macadamia, RN, ANP-C [facility] [address] [place], [place]  [address]  I have personally seen and examined this patient with Norma Fredrickson, NP. I have interviewed and examined the patient upon arrival to [facility]. She is chest pain free. With recent chest pains, agree with admission and plans for cath in am to exclude progression of CAD. I agree with the assessment and plan as outlined above. NPO at midnight. I have  reviewed the indications for a cardiac cath and she agrees to proceed tomorrow if the INR is less than 1.7.   Natahlia Hoggard 3:04 PM 06/17/2013                   Encounter-Level Documents:    Electronic signature on 06/17/2013 7:57 AM    Vitals - Last Recorded    BP Pulse Ht Wt BMI Sp02  110/60 88 5' 4.5" (1.638 m) 156 lb 1.9 oz (70.816 kg) 26.39 kg/m2 89% Comment:at rest    Vitals History Recorded    Orders Placed This Encounter    EKG 12-Lead [EKG1 Custom]      Results are available for this encounter    Discontinued Medications     Reason for Discontinue    azithromycin (ZITHROMAX) 250 MG tablet Error    Level of Service    PR OFFICE OUTPATIENT VISIT 25 MINUTES [99214]    Follow-up and Disposition    Routing History Recorded    All Charges for This Encounter    Code Description Service Date Service Provider Modifiers Qty    93000 PR ELECTROCARDIOGRAM, COMPLETE 06/17/2013 Rosalio Macadamia, NP   1    [phone] PR OFFICE OUTPATIENT VISIT 25 MINUTES 06/17/2013 Rosalio Macadamia, NP   1    [phone] PR ORAL ANTIPLATELET T THERAPY PRESCRIBED 06/17/2013 Rosalio Macadamia, NP   1    [phone] PR ASP THERP USED 06/17/2013 Rosalio Macadamia, NP   1    [phone] PR CURRENT TOBACCO NON-USER 06/17/2013 Rosalio Macadamia, NP   1    Patient Instructions    We are admitting you to the hospital this morning  Previous Visit     Provider Department Encounter #    05/01/2013 10:46 AM Norma Fredrickson, NP Augusto Gamble [id number]

## 2013-06-18 ENCOUNTER — Encounter (HOSPITAL_COMMUNITY)
Admission: AD | Disposition: A | Discharge status: Home / Self Care | Payer: Self-pay | Source: Ambulatory Visit | Attending: Cardiovascular Disease

## 2013-06-18 ENCOUNTER — Encounter (HOSPITAL_COMMUNITY): Payer: Medicare Other

## 2013-06-18 DIAGNOSIS — I200 Unstable angina: Secondary | ICD-10-CM | POA: Diagnosis not present

## 2013-06-18 DIAGNOSIS — K219 Gastro-esophageal reflux disease without esophagitis: Secondary | ICD-10-CM | POA: Diagnosis not present

## 2013-06-18 DIAGNOSIS — I2510 Atherosclerotic heart disease of native coronary artery without angina pectoris: Secondary | ICD-10-CM

## 2013-06-18 DIAGNOSIS — R791 Abnormal coagulation profile: Secondary | ICD-10-CM | POA: Diagnosis not present

## 2013-06-18 HISTORY — PX: LEFT HEART CATHETERIZATION WITH CORONARY ANGIOGRAM: SHX5451

## 2013-06-18 LAB — BASIC METABOLIC PANEL
BUN: 11 mg/dL (ref 6–23)
CO2: 26 mEq/L (ref 19–32)
Calcium: 8.7 mg/dL (ref 8.4–10.5)
Chloride: 106 mEq/L (ref 96–112)
Creatinine, Ser: 0.75 mg/dL (ref 0.50–1.10)
GFR calc Af Amer: >90 mL/min (ref >90)
GFR calc non Af Amer: 78 mL/min — ABNORMAL LOW (ref >90)
Glucose, Bld: 91 mg/dL (ref 70–99)
Potassium: 3.6 mEq/L (ref 3.5–5.1)
Sodium: 141 mEq/L (ref 135–145)

## 2013-06-18 LAB — CBC
HCT: 38.2 % (ref 36.0–46.0)
MCH: 29.8 pg (ref 26.0–34.0)
MCHC: 33.2 g/dL (ref 30.0–36.0)
Platelets: 200 10*3/uL (ref 150–400)
RDW: 13.8 % (ref 11.5–15.5)

## 2013-06-18 LAB — LIPID PANEL
Cholesterol: 248 mg/dL — ABNORMAL HIGH (ref 0–200)
HDL: 49 mg/dL (ref >39)
LDL Cholesterol: 179 mg/dL — ABNORMAL HIGH (ref 0–99)
Total CHOL/HDL Ratio: 5.1 RATIO
Triglycerides: 99 mg/dL (ref <150)
VLDL: 20 mg/dL (ref 0–40)

## 2013-06-18 LAB — PROTIME-INR
INR: 1.64 — ABNORMAL HIGH (ref 0.00–1.49)
Prothrombin Time: 19 seconds — ABNORMAL HIGH (ref 11.6–15.2)

## 2013-06-18 LAB — HEPARIN LEVEL (UNFRACTIONATED): Heparin Unfractionated: 0.47 IU/mL (ref 0.30–0.70)

## 2013-06-18 SURGERY — LEFT HEART CATHETERIZATION WITH CORONARY ANGIOGRAM
Anesthesia: LOCAL

## 2013-06-18 MED ORDER — LIDOCAINE HCL (PF) 1 % IJ SOLN
INTRAMUSCULAR | Status: AC
  Filled 2013-06-18: qty 30

## 2013-06-18 MED ORDER — SODIUM CHLORIDE 0.9 % IJ SOLN: 3.0000 mL | Freq: Two times a day (BID) | INTRAMUSCULAR | Status: DC

## 2013-06-18 MED ORDER — HEPARIN (PORCINE) IN NACL 2-0.9 UNIT/ML-% IJ SOLN
INTRAMUSCULAR | Status: AC
  Filled 2013-06-18: qty 1500

## 2013-06-18 MED ORDER — SODIUM CHLORIDE 0.9 % IV SOLN: INTRAVENOUS | Status: DC

## 2013-06-18 MED ORDER — VERAPAMIL HCL 2.5 MG/ML IV SOLN
INTRAVENOUS | Status: AC
  Filled 2013-06-18: qty 2

## 2013-06-18 MED ORDER — FENTANYL CITRATE 0.05 MG/ML IJ SOLN
INTRAMUSCULAR | Status: AC
  Filled 2013-06-18: qty 2

## 2013-06-18 MED ORDER — ASPIRIN 81 MG PO CHEW: 81.0000 mg | CHEWABLE_TABLET | ORAL | Status: DC

## 2013-06-18 MED ORDER — SODIUM CHLORIDE 0.9 % IV SOLN
1.0000 mL/kg/h | INTRAVENOUS | Status: AC
  Administered 2013-06-18: 1 mL/kg/h via INTRAVENOUS

## 2013-06-18 MED ORDER — DIAZEPAM 5 MG PO TABS
5.0000 mg | ORAL_TABLET | ORAL | Status: AC
  Administered 2013-06-18: 5 mg via ORAL
  Filled 2013-06-18: qty 1

## 2013-06-18 MED ORDER — ASPIRIN EC 81 MG PO TBEC
81.0000 mg | DELAYED_RELEASE_TABLET | Freq: Every day | ORAL | Status: AC
  Administered 2013-06-18: 81 mg via ORAL
  Filled 2013-06-18: qty 1

## 2013-06-18 MED ORDER — MIDAZOLAM HCL 2 MG/2ML IJ SOLN
INTRAMUSCULAR | Status: AC
  Filled 2013-06-18: qty 2

## 2013-06-18 MED ORDER — NITROGLYCERIN 0.2 MG/ML ON CALL CATH LAB
INTRAVENOUS | Status: AC
  Filled 2013-06-18: qty 1

## 2013-06-18 MED ORDER — SODIUM CHLORIDE 0.9 % IV SOLN: 250.0000 mL | INTRAVENOUS | Status: DC | PRN

## 2013-06-18 MED ORDER — SODIUM CHLORIDE 0.9 % IJ SOLN: 3.0000 mL | INTRAMUSCULAR | Status: DC | PRN

## 2013-06-18 NOTE — H&P (View-Only) (Signed)
     SUBJECTIVE: NO chest pain this am.   Tele: sinus, rate 64 bpm  BP 108/54  Pulse 70  Temp(Src) 98.2 F (36.8 C) (Oral)  Resp 19  Ht 5' 4" (1.626 m)  Wt 156 lb 1.9 oz (70.816 kg)  BMI 26.78 kg/m2  SpO2 94%  Intake/Output Summary (Last 24 hours) at 06/18/13 0655 Last data filed at 06/18/13 0600  Gross per 24 hour  Intake 388.25 ml  Output   1000 ml  Net -611.75 ml    PHYSICAL EXAM General: Well developed, well nourished, in no acute distress. Alert and oriented x 3.  Psych:  Good affect, responds appropriately Neck: No JVD. No masses noted.  Lungs: Clear bilaterally with no wheezes or rhonci noted.  Heart: RRR with no murmurs noted. Abdomen: Bowel sounds are present. Soft, non-tender.  Extremities: No lower extremity edema.   LABS: Basic Metabolic Panel:  Recent Labs  06/17/13 1030  NA 141  K 4.1  CL 106  CO2 26  GLUCOSE 81  BUN 15  CREATININE 0.82  CALCIUM 9.2  MG 1.8   CBC:  Recent Labs  06/17/13 1030 06/18/13 0613  WBC 9.7 8.6  NEUTROABS 5.0  --   HGB 13.3 12.7  HCT 39.1 38.2  MCV 89.7 89.7  PLT 189 200   Cardiac Enzymes:  Recent Labs  06/17/13 0949 06/17/13 1539 06/17/13 2135  TROPONINI <0.30 <0.30 <0.30   Current Meds: . aspirin  324 mg Oral NOW   Or  . aspirin  300 mg Rectal NOW  . aspirin EC  81 mg Oral Daily  . metoprolol tartrate  25 mg Oral BID     ASSESSMENT AND PLAN:  1. Unstable angina: Known to have moderate CAD. Recent chest pain c/w unstable angina. Plans for cath today with possible PCI . INR is 1.64 this am. NPO this am.    [name] [name]  12/9/20146:55 AM  

## 2013-06-18 NOTE — Progress Notes (Signed)
ANTICOAGULATION CONSULT NOTE - Follow Up Consult  Pharmacy Consult for heparin Indication: chest pain/ACS  Allergies  Allergen Reactions  . Crestor [Rosuvastatin]     Muscle pain  . Lactose Intolerance (Gi)     Stomach upset  . Niacin Hives  . Penicillins Hives    Patient Measurements: Height: 5\' 4"  (162.6 cm) Weight: 156 lb 1.9 oz (70.816 kg) IBW/kg (Calculated) : 54.7 Heparin Dosing Weight: 69 kg  Vital Signs: Temp: 98.8 F (37.1 C) (12/09 0700) Temp src: Oral (12/09 0700) BP: 121/73 mmHg (12/09 0800) Pulse Rate: 70 (12/09 0800)  Labs:  Recent Labs  06/17/13 0949 06/17/13 1030 06/17/13 1539 06/17/13 2135 06/18/13 0613  HGB  --  13.3  --   --  12.7  HCT  --  39.1  --   --  38.2  PLT  --  189  --   --  200  APTT  --  36  --   --   --   LABPROT  --  19.1*  --   --  19.0*  INR  --  1.66*  --   --  1.64*  HEPARINUNFRC  --   --   --  0.22* 0.47  CREATININE  --  0.82  --   --  0.75  TROPONINI <0.30  --  <0.30 <0.30  --     Estimated Creatinine Clearance: 54.1 ml/min (by C-G formula based on Cr of 0.75).   Medications:  Scheduled:  . aspirin  324 mg Oral NOW   Or  . aspirin  300 mg Rectal NOW  . [START ON 06/19/2013] aspirin  81 mg Oral Pre-Cath  . aspirin EC  81 mg Oral Daily  . diazepam  5 mg Oral On Call  . metoprolol tartrate  25 mg Oral BID  . sodium chloride  3 mL Intravenous Q12H   Infusions:  . sodium chloride 500 mL (06/17/13 1345)  . [START ON 06/19/2013] sodium chloride    . heparin 950 Units/hr (06/17/13 2223)  . nitroGLYCERIN      Assessment: 79 yo female on chronic Coumadin for hx DVT/PE.  Coumadin now on hold for cath, INR 1.64 today and patient is therapeutic on IV  Heparin at 950 units/hr.  No bleeding or complications noted per chart notes, CBC fairly stable.  Goal of Therapy:  Heparin level 0.3-0.7 units/ml Monitor platelets by anticoagulation protocol: Yes   Plan:  1) Continue IV heparin at current rate. 2) Daily heparin  level and CBC 3) Will f/u plans to resume oral anticoagulation after cath today.  Tad Moore, BCPS  Clinical Pharmacist Pager [phone]  06/18/2013 8:50 AM

## 2013-06-18 NOTE — Interval H&P Note (Signed)
History and Physical Interval Note:  06/18/2013 5:30 PM  [name] [name]  has presented today for surgery, with the diagnosis of cad  The various methods of treatment have been discussed with the patient and family. After consideration of risks, benefits and other options for treatment, the patient has consented to  Procedure(s): LEFT HEART CATHETERIZATION WITH CORONARY ANGIOGRAM (N/A) as a surgical intervention .  The patient's history has been reviewed, patient examined, no change in status, stable for surgery.  I have reviewed the patient's chart and labs.  Questions were answered to the patient's satisfaction.    Cath Lab Visit (complete for each Cath Lab visit)  Clinical Evaluation Leading to the Procedure:   ACS: yes  Non-ACS:    Anginal Classification: CCS III  Anti-ischemic medical therapy: No Therapy  Non-Invasive Test Results: No non-invasive testing performed  Prior CABG: No previous CABG       Theron Arista [facility] 06/18/2013 5:31 PM

## 2013-06-18 NOTE — CV Procedure (Signed)
    Cardiac Catheterization Procedure Note  Name: [name] [name] MRN: [id number] DOB: [birth year]  Procedure: Left Heart Cath, Selective Coronary Angiography, LV angiography  Indication: 79 yo female presents with symptoms of progressive chest pain.   Procedural Details: The right wrist was prepped, draped, and anesthetized with 1% lidocaine. Using the modified Seldinger technique, a 5 French sheath was introduced into the right radial artery. 3 mg of verapamil was administered through the sheath, weight-based unfractionated heparin was administered intravenously. Standard Judkins catheters were used for selective coronary angiography and left ventriculography. Catheter exchanges were performed over an exchange length guidewire. There were no immediate procedural complications. A TR band was used for radial hemostasis at the completion of the procedure.  The patient was transferred to the post catheterization recovery area for further monitoring.  Procedural Findings: Hemodynamics: AO 95/49 mean 70 mm Hg LV 96/8 mm Hg  Coronary angiography: Coronary dominance: right  Left mainstem: Normal  Left anterior descending (LAD):  Mild calcification. 30% disease in the mid vessel at the take off of the first diagonal.  Left circumflex (LCx): 30% disease in the mid vessel.  Right coronary artery (RCA):  Mild calcification. Mild disease in the proximal and mid vessel to 20%.  Left ventriculography: Left ventricular systolic function is normal, LVEF is estimated at 55-65%, there is no significant mitral regurgitation   Final Conclusions:   1. Nonobstructive CAD 2. Normal LV function  Recommendations: Medical management. If no bleeding can resume Coumadin in am.  Theron Arista [facility] 06/18/2013, 5:55 PM

## 2013-06-18 NOTE — Progress Notes (Signed)
     SUBJECTIVE: NO chest pain this am.   Tele: sinus, rate 64 bpm  BP 108/54  Pulse 70  Temp(Src) 98.2 F (36.8 C) (Oral)  Resp 19  Ht 5\' 4"  (1.626 m)  Wt 156 lb 1.9 oz (70.816 kg)  BMI 26.78 kg/m2  SpO2 94%  Intake/Output Summary (Last 24 hours) at 06/18/13 0655 Last data filed at 06/18/13 0600  Gross per 24 hour  Intake 388.25 ml  Output   1000 ml  Net -611.75 ml    PHYSICAL EXAM General: Well developed, well nourished, in no acute distress. Alert and oriented x 3.  Psych:  Good affect, responds appropriately Neck: No JVD. No masses noted.  Lungs: Clear bilaterally with no wheezes or rhonci noted.  Heart: RRR with no murmurs noted. Abdomen: Bowel sounds are present. Soft, non-tender.  Extremities: No lower extremity edema.   LABS: Basic Metabolic Panel:  Recent Labs  69/62/95 1030  NA 141  K 4.1  CL 106  CO2 26  GLUCOSE 81  BUN 15  CREATININE 0.82  CALCIUM 9.2  MG 1.8   CBC:  Recent Labs  06/17/13 1030 06/18/13 0613  WBC 9.7 8.6  NEUTROABS 5.0  --   HGB 13.3 12.7  HCT 39.1 38.2  MCV 89.7 89.7  PLT 189 200   Cardiac Enzymes:  Recent Labs  06/17/13 0949 06/17/13 1539 06/17/13 2135  TROPONINI <0.30 <0.30 <0.30   Current Meds: . aspirin  324 mg Oral NOW   Or  . aspirin  300 mg Rectal NOW  . aspirin EC  81 mg Oral Daily  . metoprolol tartrate  25 mg Oral BID     ASSESSMENT AND PLAN:  1. Unstable angina: Known to have moderate CAD. Recent chest pain c/w unstable angina. Plans for cath today with possible PCI . INR is 1.64 this am. NPO this am.    MCALHANY,CHRISTOPHER  12/9/20146:55 AM

## 2013-06-19 DIAGNOSIS — R079 Chest pain, unspecified: Secondary | ICD-10-CM | POA: Diagnosis not present

## 2013-06-19 DIAGNOSIS — I2510 Atherosclerotic heart disease of native coronary artery without angina pectoris: Secondary | ICD-10-CM | POA: Diagnosis not present

## 2013-06-19 DIAGNOSIS — Z7901 Long term (current) use of anticoagulants: Secondary | ICD-10-CM | POA: Diagnosis not present

## 2013-06-19 LAB — CBC
HCT: 38.3 % (ref 36.0–46.0)
Hemoglobin: 12.9 g/dL (ref 12.0–15.0)
MCH: 30.1 pg (ref 26.0–34.0)
MCHC: 33.7 g/dL (ref 30.0–36.0)
MCV: 89.3 fL (ref 78.0–100.0)
Platelets: 176 K/uL (ref 150–400)
RBC: 4.29 MIL/uL (ref 3.87–5.11)
RDW: 13.7 % (ref 11.5–15.5)
WBC: 8.2 K/uL (ref 4.0–10.5)

## 2013-06-19 MED ORDER — WARFARIN - PHYSICIAN DOSING INPATIENT: Freq: Every day | Status: DC

## 2013-06-19 MED ORDER — PANTOPRAZOLE SODIUM 40 MG PO TBEC
40.0000 mg | DELAYED_RELEASE_TABLET | Freq: Every day | ORAL | Status: DC
  Administered 2013-06-19: 40 mg via ORAL
  Filled 2013-06-19: qty 1

## 2013-06-19 MED ORDER — WARFARIN SODIUM 5 MG PO TABS
5.0000 mg | ORAL_TABLET | Freq: Once | ORAL | Status: DC
  Filled 2013-06-19: qty 1

## 2013-06-19 NOTE — Discharge Summary (Signed)
CARDIOLOGY DISCHARGE SUMMARY   Patient ID: [name] [name] MRN: [id number] DOB/AGE: 14-Mar-1931 79 y.o.  Admit date: 06/17/2013 Discharge date: 06/19/2013  PCP: Lillia Mountain, MD Primary Cardiologist: CM  Primary Discharge Diagnosis: Precordial pain   Secondary Discharge Diagnosis:  Active Problems:   Unstable angina  Procedures: Left Heart Cath, Selective Coronary Angiography, LV angiography  Hospital Course: [name] [name] is a 79 y.o. female with a history of nonobstructive CAD by remote cath. She is on Coumadin for history of DVT/PE. She was seen in the office and having chest pain with exertion. She was admitted for further evaluation and treatment.  Her Coumadin was subtherapeutic so she was started on heparin. Her cardiac enzymes were negative for MI. Cardiac catheterization was recommended to further define her anatomy. This was performed on 06/18/2013. Results are below. She had nonobstructive disease and medical therapy is recommended. Her Coumadin was resumed.  On 06/19/2013, she was seen by  Dr. Clifton James. Her chest pain had resolved. She was ambulating without shortness of breath. He evaluated her and reviewed all data. No further inpatient workup is indicated and she is considered stable for discharge, to follow up as an outpatient. She is to keep all pulmonary and internal medicine appointments. She is to followup with Coumadin checks as scheduled.  Labs:  Lab Results  Component Value Date   WBC 8.2 06/19/2013   HGB 12.9 06/19/2013   HCT 38.3 06/19/2013   MCV 89.3 06/19/2013   PLT 176 06/19/2013     Recent Labs Lab 06/17/13 1030 06/18/13 0613  NA 141 141  K 4.1 3.6  CL 106 106  CO2 26 26  BUN 15 11  CREATININE 0.82 0.75  CALCIUM 9.2 8.7  PROT 7.1  --   BILITOT 0.6  --   ALKPHOS 78  --   ALT 17  --   AST 25  --   GLUCOSE 81 91    Recent Labs  06/17/13 0949 06/17/13 1539 06/17/13 2135  TROPONINI <0.30 <0.30 <0.30   Lipid Panel       Component Value Date/Time   CHOL 248* 06/18/2013 0500   TRIG 99 06/18/2013 0500   HDL 49 06/18/2013 0500   CHOLHDL 5.1 06/18/2013 0500   VLDL 20 06/18/2013 0500   LDLCALC 179* 06/18/2013 0500    Recent Labs  06/18/13 0613  INR 1.64*      Radiology: X-ray Chest Pa And Lateral 06/17/2013   CLINICAL DATA:  Chest pain, unstable angina, history coronary artery disease  EXAM: CHEST  2 VIEW  COMPARISON:  04/30/2013  FINDINGS: Upper normal heart size.  Mildly tortuous thoracic aorta.  Pulmonary vascularity normal.  Again identified interstitial infiltrates peripherally at the mid to lower lungs, greatest at bases, compatible with pulmonary fibrosis/usual interstitial pneumonitis.  No acute infiltrate, pleural effusion or pneumothorax.  Bones demineralized.  IMPRESSION: No active cardiopulmonary disease.   Electronically Signed   By: Ulyses Southward M.D.   On: 06/17/2013 16:50   Cardiac Cath: 06/18/2013 Left mainstem: Normal  Left anterior descending (LAD): Mild calcification. 30% disease in the mid vessel at the take off of the first diagonal.  Left circumflex (LCx): 30% disease in the mid vessel.  Right coronary artery (RCA): Mild calcification. Mild disease in the proximal and mid vessel to 20%.  Left ventriculography: Left ventricular systolic function is normal, LVEF is estimated at 55-65%, there is no significant mitral regurgitation  Final Conclusions:  1. Nonobstructive CAD  2. Normal LV function  Recommendations: Medical management. If no bleeding can resume Coumadin in am.  EKG: 06/18/2013 SR Vent. rate 63 BPM PR interval 198 ms QRS duration 80 ms QT/QTc 428/437 ms P-R-T axes 36 -8 20   FOLLOW UP PLANS AND APPOINTMENTS Allergies  Allergen Reactions  . Crestor [Rosuvastatin]     Muscle pain  . Lactose Intolerance (Gi)     Stomach upset  . Niacin Hives  . Penicillins Hives     Medication List         ALPRAZolam 0.5 MG tablet  Commonly known as:  XANAX  Take 0.25-0.5 mg by  mouth as needed for anxiety. For anxiety     aspirin EC 81 MG tablet  Take 81 mg by mouth daily as needed for mild pain.     fluticasone 50 MCG/ACT nasal spray  Commonly known as:  FLONASE  Place 2 sprays into both nostrils daily as needed for allergies or rhinitis.     multivitamin capsule  Take 1 capsule by mouth daily.     PROBIOTIC DAILY PO  Take by mouth daily.     warfarin 5 MG tablet  Commonly known as:  COUMADIN  Take 2.5-5 mg by mouth daily. Take 1 tablet (5mg ) on Tuesday and Friday.  Take 1/2 tablet (2.5mg ) on all other days        Discharge Orders   Future Appointments Provider Department Dept Phone   06/20/2013 10:30 AM [facility] CARDIAC [facility] [phone]   06/25/2013 10:30 AM [facility] [phone]   06/27/2013 10:30 AM [facility] Re[facility] Tammy Sours [facility] CARDIAC [facility] [phone]   07/02/2013 10:30 AM [facility] Re[facility] Tammy Sours [facility]AC [facility] [phone]   07/04/2013 10:30 AM [facility] Rehab Tammy Sours [facility] CARDIAC [facility] [phone]   07/09/2013 10:30 AM [facility] Rehab Tammy Sours [facility] CARDIAC [facility] [phone]   07/09/2013 1:30 PM [facility] [phone]   07/11/2013 10:30 AM [facility] Rehab Tammy Sours [facility] CARDIAC [facility] [phone]   07/16/2013 10:30 AM [facility] Rehab Tammy Sours [facility] CARDIAC [facility] [phone]   07/18/2013 10:30 AM [facility] Re[facility] Tammy Sours [facility] CARDIAC [facility] [phone]   07/23/2013 10:30 AM [facility] Tammy Sours [facility] CARDIAC [facility] [phone]   07/25/2013 8:45 AM Kathleene Hazel, MD [facility] Office [phone]   07/25/2013 10:30 AM [facility] Re[facility] C[facility] [phone]   07/30/2013 10:30 AM [facility] Re[facility] CARDIAC [facility] [phone]   07/31/2013 1:30 PM Barbaraann Share, MD [facility] [phone]   08/01/2013 10:30 AM [facility]Pulmonary Rehab Glynda Jaeger [facility] CARDIAC [facility] [phone]   08/06/2013 10:30 AM [facility] Rehab Glynda Jaeger [facility] CARDIAC [facility] [phone]   08/08/2013 10:30 AM [facility] Rehab [facility] CARDIAC [facility] [phone]   08/13/2013 10:30 AM [facility] Rehab [facility] CARDIAC [facility] [phone]   08/15/2013 10:30 AM [facility] Re[facility] CARDIAC [facility] [phone]   08/20/2013 10:30 AM [facility]Pulmonary Rehab Glynda Jaeger [facility] CARDIAC [facility] [phone]   08/22/2013 10:30 AM [facility] Rehab Tammy Sours [facility] CARDIAC [facility] [phone]   Future Orders Complete By Expires   Diet - low sodium heart healthy  As directed    Increase activity slowly  As directed  Follow-up Information   Follow up with Verne Carrow, MD On 07/25/2013. (At 8:45 AM)    Specialty:  Cardiology   Contact information:   [address] [place] [address] [phone]       Follow up with [facility]. (Keep all appointments)       BRING ALL MEDICATIONS WITH YOU TO FOLLOW UP APPOINTMENTS  Time spent with patient to include physician time: 32 min Signed: Theodore Demark, PA-C 06/19/2013, 11:07 AM Co-Sign MD

## 2013-06-19 NOTE — Progress Notes (Signed)
     SUBJECTIVE: NO chest pain or SOB.   BP 104/55  Pulse 71  Temp(Src) 98.6 F (37 C) (Oral)  Resp 23  Ht 5\' 4"  (1.626 m)  Wt 157 lb 14.4 oz (71.623 kg)  BMI 27.09 kg/m2  SpO2 94%  Intake/Output Summary (Last 24 hours) at 06/19/13 0844 Last data filed at 06/19/13 0215  Gross per 24 hour  Intake 1050.4 ml  Output    325 ml  Net  725.4 ml    PHYSICAL EXAM General: Well developed, well nourished, in no acute distress. Alert and oriented x 3.  Psych:  Good affect, responds appropriately Neck: No JVD. No masses noted.  Lungs: Clear bilaterally with no wheezes or rhonci noted.  Heart: RRR with no murmurs noted. Abdomen: Bowel sounds are present. Soft, non-tender.  Extremities: No lower extremity edema.   LABS: Basic Metabolic Panel:  Recent Labs  16/10/96 1030 06/18/13 0613  NA 141 141  K 4.1 3.6  CL 106 106  CO2 26 26  GLUCOSE 81 91  BUN 15 11  CREATININE 0.82 0.75  CALCIUM 9.2 8.7  MG 1.8  --    CBC:  Recent Labs  06/17/13 1030 06/18/13 0613 06/19/13 0515  WBC 9.7 8.6 8.2  NEUTROABS 5.0  --   --   HGB 13.3 12.7 12.9  HCT 39.1 38.2 38.3  MCV 89.7 89.7 89.3  PLT 189 200 176   Cardiac Enzymes:  Recent Labs  06/17/13 0949 06/17/13 1539 06/17/13 2135  TROPONINI <0.30 <0.30 <0.30   Fasting Lipid Panel:  Recent Labs  06/18/13 0500  CHOL 248*  HDL 49  LDLCALC 179*  TRIG 99  CHOLHDL 5.1    Current Meds: . pantoprazole  40 mg Oral Daily  . warfarin  5 mg Oral Once   Cardiac cath 06/18/13: Left mainstem: Normal  Left anterior descending (LAD): Mild calcification. 30% disease in the mid vessel at the take off of the first diagonal.  Left circumflex (LCx): 30% disease in the mid vessel.  Right coronary artery (RCA): Mild calcification. Mild disease in the proximal and mid vessel to 20%.  Left ventriculography: Left ventricular systolic function is normal, LVEF is estimated at 55-65%, there is no significant mitral regurgitation     ASSESSMENT AND PLAN:  1. Chest pain/CAD: Pt admitted with chest pain. Cardiac cath yesterday with mild non-obstructive CAD. She is not on ASA since she is on long term coumadin therapy. She is intolerant of statins. Her chest pain appears to be non-cardiac. Most likely GERD. Will start Protonix 40 mg po Qdaily.   2. History of DVT/PE: No evidence of PE on V/Q scan October 2014. She is on long term coumadin therapWill resume coumadin today. She does not need bridging with Lovenox. She can f/u in the coumadin clinic in the [place] office in 5 days.   3. Dispo: D/C home today. She can f/u with me or Norma Fredrickson, NP in 2-3 weeks.     [name] [name]  12/10/20148:44 AM

## 2013-06-20 ENCOUNTER — Encounter (HOSPITAL_COMMUNITY): Payer: Medicare Other

## 2013-06-20 NOTE — Discharge Summary (Signed)
See my full note. cdm

## 2013-06-25 ENCOUNTER — Telehealth: Payer: Self-pay | Admitting: Cardiovascular Disease

## 2013-06-25 ENCOUNTER — Encounter (HOSPITAL_COMMUNITY)
Admission: RE | Admit: 2013-06-25 | Discharge: 2013-06-25 | Disposition: A | Discharge status: Home / Self Care | Payer: Medicare Other | Source: Ambulatory Visit | Attending: Pulmonary Disease | Admitting: Pulmonary Disease

## 2013-06-25 DIAGNOSIS — Z5189 Encounter for other specified aftercare: Secondary | ICD-10-CM | POA: Diagnosis not present

## 2013-06-25 DIAGNOSIS — J84112 Idiopathic pulmonary fibrosis: Secondary | ICD-10-CM | POA: Diagnosis not present

## 2013-06-25 DIAGNOSIS — M0510 Rheumatoid lung disease with rheumatoid arthritis of unspecified site: Secondary | ICD-10-CM | POA: Diagnosis not present

## 2013-06-25 DIAGNOSIS — J449 Chronic obstructive pulmonary disease, unspecified: Secondary | ICD-10-CM | POA: Diagnosis not present

## 2013-06-25 DIAGNOSIS — Z86711 Personal history of pulmonary embolism: Secondary | ICD-10-CM | POA: Diagnosis not present

## 2013-06-25 NOTE — Telephone Encounter (Signed)
New problem   Need to know if pt can start back pulmonary rehab please fax to [phone].

## 2013-06-25 NOTE — Telephone Encounter (Signed)
Spoke with Jamesetta So in Pulmonary rehab and advised her that patient's discharge instructions state that patient keep all pulmonary appointments and pulmonary rehab appointments were listed for patient on her instructions.  Jamesetta So thanked me for my help.

## 2013-06-27 ENCOUNTER — Telehealth (HOSPITAL_COMMUNITY): Payer: Self-pay | Admitting: Internal Medicine

## 2013-06-27 ENCOUNTER — Encounter (HOSPITAL_COMMUNITY): Payer: Medicare Other

## 2013-07-02 ENCOUNTER — Encounter (HOSPITAL_COMMUNITY)
Admission: RE | Admit: 2013-07-02 | Discharge: 2013-07-02 | Disposition: A | Discharge status: Home / Self Care | Payer: Medicare Other | Source: Ambulatory Visit | Attending: Pulmonary Disease | Admitting: Pulmonary Disease

## 2013-07-04 ENCOUNTER — Encounter (HOSPITAL_COMMUNITY): Payer: Medicare Other

## 2013-07-09 ENCOUNTER — Ambulatory Visit (INDEPENDENT_AMBULATORY_CARE_PROVIDER_SITE_OTHER): Payer: Medicare Other | Admitting: General Practice

## 2013-07-09 ENCOUNTER — Encounter (HOSPITAL_COMMUNITY)
Admission: RE | Admit: 2013-07-09 | Discharge: 2013-07-09 | Disposition: A | Discharge status: Home / Self Care | Payer: Medicare Other | Source: Ambulatory Visit | Attending: Pulmonary Disease | Admitting: Pulmonary Disease

## 2013-07-09 DIAGNOSIS — Z5181 Encounter for therapeutic drug level monitoring: Secondary | ICD-10-CM

## 2013-07-09 DIAGNOSIS — Z7901 Long term (current) use of anticoagulants: Secondary | ICD-10-CM | POA: Diagnosis not present

## 2013-07-09 DIAGNOSIS — I2699 Other pulmonary embolism without acute cor pulmonale: Secondary | ICD-10-CM | POA: Diagnosis not present

## 2013-07-09 NOTE — Progress Notes (Signed)
Pre-visit discussion using our clinic review tool. No additional management support is needed unless otherwise documented below in the visit note.  

## 2013-07-11 ENCOUNTER — Encounter (HOSPITAL_COMMUNITY): Payer: Medicare Other

## 2013-07-11 DIAGNOSIS — M0510 Rheumatoid lung disease with rheumatoid arthritis of unspecified site: Secondary | ICD-10-CM | POA: Insufficient documentation

## 2013-07-11 DIAGNOSIS — J4489 Other specified chronic obstructive pulmonary disease: Secondary | ICD-10-CM | POA: Insufficient documentation

## 2013-07-11 DIAGNOSIS — J84112 Idiopathic pulmonary fibrosis: Secondary | ICD-10-CM | POA: Insufficient documentation

## 2013-07-11 DIAGNOSIS — Z5189 Encounter for other specified aftercare: Secondary | ICD-10-CM | POA: Insufficient documentation

## 2013-07-11 DIAGNOSIS — Z86711 Personal history of pulmonary embolism: Secondary | ICD-10-CM | POA: Insufficient documentation

## 2013-07-11 DIAGNOSIS — J449 Chronic obstructive pulmonary disease, unspecified: Secondary | ICD-10-CM | POA: Insufficient documentation

## 2013-07-16 ENCOUNTER — Encounter (HOSPITAL_COMMUNITY)
Admission: RE | Admit: 2013-07-16 | Discharge: 2013-07-16 | Disposition: A | Discharge status: Home / Self Care | Payer: Medicare Other | Source: Ambulatory Visit | Attending: Pulmonary Disease | Admitting: Pulmonary Disease

## 2013-07-16 DIAGNOSIS — J84112 Idiopathic pulmonary fibrosis: Secondary | ICD-10-CM | POA: Diagnosis not present

## 2013-07-16 DIAGNOSIS — Z5189 Encounter for other specified aftercare: Secondary | ICD-10-CM | POA: Diagnosis not present

## 2013-07-16 DIAGNOSIS — M0510 Rheumatoid lung disease with rheumatoid arthritis of unspecified site: Secondary | ICD-10-CM | POA: Diagnosis not present

## 2013-07-16 DIAGNOSIS — Z86711 Personal history of pulmonary embolism: Secondary | ICD-10-CM | POA: Diagnosis not present

## 2013-07-16 DIAGNOSIS — J449 Chronic obstructive pulmonary disease, unspecified: Secondary | ICD-10-CM | POA: Diagnosis not present

## 2013-07-18 ENCOUNTER — Encounter: Payer: Medicare Other | Admitting: Cardiology

## 2013-07-18 ENCOUNTER — Encounter (HOSPITAL_COMMUNITY): Payer: Medicare Other

## 2013-07-23 ENCOUNTER — Encounter (HOSPITAL_COMMUNITY): Payer: Medicare Other

## 2013-07-25 ENCOUNTER — Encounter (HOSPITAL_COMMUNITY)
Admission: RE | Admit: 2013-07-25 | Discharge: 2013-07-25 | Disposition: A | Discharge status: Home / Self Care | Payer: Medicare Other | Source: Ambulatory Visit | Attending: Pulmonary Disease | Admitting: Pulmonary Disease

## 2013-07-25 ENCOUNTER — Encounter: Payer: Self-pay | Admitting: Cardiovascular Disease

## 2013-07-25 ENCOUNTER — Ambulatory Visit (INDEPENDENT_AMBULATORY_CARE_PROVIDER_SITE_OTHER): Payer: Medicare Other | Admitting: Cardiovascular Disease

## 2013-07-25 VITALS — BP 122/64 | HR 93 | Ht 64.0 in | Wt 161.0 lb

## 2013-07-25 DIAGNOSIS — Z0000 Encounter for general adult medical examination without abnormal findings: Secondary | ICD-10-CM | POA: Diagnosis not present

## 2013-07-25 DIAGNOSIS — I2510 Atherosclerotic heart disease of native coronary artery without angina pectoris: Secondary | ICD-10-CM

## 2013-07-25 NOTE — Progress Notes (Signed)
History of Present Illness: 80 yo WF with history of CAD, DVT/PE, HLD, pulmonary fibrosis who is here today for cardiac follow up. I saw her 02/27/12 as a new patient for evaluation of chest pains and pain between her shoulder blades. Stress test was normal in August 2013. Known to have mild CAD by cath 2008. Admitted December 2014 with chest pain. She underwent cardiac catheterization on December 9,2014 and found to have 30% mid LAD, 30% mid Circumflex, 20% mid RCA. She has not tolerated statins in the past. Echo 03/06/12 with normal LV size and function with no significant valvular abnormalities.   She is here today for f/u. She has had no chest pain. Her breathing is stable. She has been exercising in pulmonary rehab.   Primary Care Physician: Lavone Orn  Last Lipid Profile:  Lipid Panel     Component Value Date/Time   CHOL 248* 06/18/2013 0500   TRIG 99 06/18/2013 0500   HDL 49 06/18/2013 0500   CHOLHDL 5.1 06/18/2013 0500   VLDL 20 06/18/2013 0500   LDLCALC 179* 06/18/2013 0500     Past Medical History  Diagnosis Date  . Allergic rhinitis   . History of pulmonary embolism   . DVT (deep venous thrombosis)   . Dyslipidemia   . Pulmonary fibrosis   . DJD (degenerative joint disease)   . Osteoporosis   . CAD (coronary artery disease)     Mild CAD by cath 2008    Past Surgical History  Procedure Laterality Date  . Abdominal hysterectomy    . Appendectomy    . Total knee arthroplasty  1997    Bilateral  . Tonsillectomy      Current Outpatient Prescriptions  Medication Sig Dispense Refill  . ALPRAZolam (XANAX) 0.5 MG tablet Take 0.25-0.5 mg by mouth as needed for anxiety. For anxiety      . aspirin EC 81 MG tablet Take 81 mg by mouth daily as needed for mild pain.      . fluticasone (FLONASE) 50 MCG/ACT nasal spray Place 2 sprays into both nostrils daily as needed for allergies or rhinitis.      . Multiple Vitamin (MULTIVITAMIN) capsule Take 1 capsule by mouth daily.         . Probiotic Product (PROBIOTIC DAILY PO) Take by mouth daily.      [name] warfarin (COUMADIN) 5 MG tablet Take 2.5-5 mg by mouth daily. Take 1 tablet (5mg ) on Tuesday and Friday.  Take 1/2 tablet (2.5mg ) on all other days       No current facility-administered medications for this visit.   Facility-Administered Medications Ordered in Other Visits  Medication Dose Route Frequency Provider Last Rate Last Dose  . 0.9 %  sodium chloride infusion  250 mL Intravenous PRN Juanito Doom, MD        Allergies  Allergen Reactions  . Crestor [Rosuvastatin]     Muscle pain  . Lactose Intolerance (Gi)     Stomach upset  . Niacin Hives  . Penicillins Hives    History   Social History  . Marital Status: Widowed    Spouse Name: N/A    Number of Children: 2  . Years of Education: N/A   Occupational History  . government Designer, television/film set     retired   Social History Main Topics  . Smoking status: Former Smoker -- 0.30 packs/day for 20 years    Types: Cigarettes    Quit date: 07/11/1973  . Smokeless tobacco:  Never Used  . Alcohol Use: 0.0 oz/week     Comment: approx 1 glass wine per week  . Drug Use: No  . Sexual Activity: No   Other Topics Concern  . Not on file   Social History Narrative   Widowed.   2 children: 1 boy and 1 girl   Alcohol use- yes 2/month   Daily caffeine use ---3/week   Illicit drug use---no    Family History  Problem Relation Age of Onset  . Kidney disease Daughter 5  . Cancer Daughter   . Heart attack Father 48  . Alzheimer's disease Sister     Review of Systems:  As stated in the HPI and otherwise negative.   BP 122/64  Pulse 93  Ht 5\' 4"  (1.626 m)  Wt 161 lb (73.029 kg)  BMI 27.62 kg/m2  Physical Examination: General: Well developed, well nourished, NAD HEENT: OP clear, mucus membranes moist SKIN: warm, dry. No rashes. Neuro: No focal deficits Musculoskeletal: Muscle strength 5/5 all ext Psychiatric: Mood and affect normal Neck: No  JVD, no carotid bruits, no thyromegaly, no lymphadenopathy. Lungs:Clear bilaterally, no wheezes, rhonci, crackles Cardiovascular: Regular rate and rhythm. No murmurs, gallops or rubs. Abdomen:Soft. Bowel sounds present. Non-tender.  Extremities: No lower extremity edema. Pulses are 2 + in the bilateral DP/PT.  EKG: NSR, rate 93 bpm. LAE.   Exercise Stress Myoview: 03/05/12:  Stress Procedure: The patient performed treadmill exercise using a Bruce Protocol for minutes. The patient stopped due to SOB and denied any chest pain. There were no significant ST-T wave changes. Technetium 1m Tetrofosmin was injected at peak exercise and myocardial perfusion imaging was performed after a brief delay.  Stress ECG: No significant change from baseline ECG  QPS  Raw Data Images: Images were motion corrected. Soft tissue (diaphragm, breast) surround heart.  Stress Images: Normal homogeneous uptake in all areas of the myocardium.  Rest Images: Normal homogeneous uptake in all areas of the myocardium.  Subtraction (SDS): No evidence of ischemia.  Transient Ischemic Dilatation (Normal <1.22): *0.76**  Lung/Heart Ratio (Normal <0.45): 0.42  Quantitative Gated Spect Images  QGS EDV: 42 ml  QGS ESV: 6 ml  Impression  Exercise Capacity: Poor exercise capacity.  BP Response: Normal blood pressure response.  Clinical Symptoms: No chest pain.  ECG Impression: No significant ST segment change suggestive of ischemia.  Comparison with Prior Nuclear Study: No images to compare  Overall Impression: Normal stress nuclear study.  Echo 03/06/12:  Left ventricle: The cavity size was normal. Wall thickness was normal. Systolic function was normal. The estimated ejection fraction was in the range of 55% to 60%. Wall motion was normal; there were no regional wall motion abnormalities. Doppler parameters are consistent with abnormal left ventricular relaxation (grade 1 diastolic dysfunction). - Aortic valve: There  was no stenosis. - Mitral valve: No significant regurgitation. - Right ventricle: The cavity size was normal. Systolic function was mildly reduced. - Pulmonary arteries: PA peak pressure: 32mm Hg (S). - Inferior vena cava: The vessel was normal in size; the respirophasic diameter changes were in the normal range (= 50%); findings are consistent with normal central venous pressure. Impressions:  - Normal LV size and systolic function, EF 06-30%. Normal RV size with mildly decreased systolic function. Doppler interrogation of TR jet does not suggest pulmonary hypertension. No significant valvular abnormalities.  Cardiac cath 06/18/13: Left mainstem: Normal  Left anterior descending (LAD): Mild calcification. 30% disease in the mid vessel at the take off of  the first diagonal.  Left circumflex (LCx): 30% disease in the mid vessel.  Right coronary artery (RCA): Mild calcification. Mild disease in the proximal and mid vessel to 20%.  Left ventriculography: Left ventricular systolic function is normal, LVEF is estimated at 55-65%, there is no significant mitral regurgitation   Assessment and Plan:   1. CAD: She is known to have mild CAD by cath December 2014. She has not been on a beta blocker or statin. She is intolerant to statins. She will continue taking ASA 81 mg po Qdaily. She does not wish to start a beta blocker. BP is within normal range.

## 2013-07-25 NOTE — Patient Instructions (Signed)
Your physician wants you to follow-up in:  12 months.  You will receive a reminder letter in the mail two months in advance. If you don't receive a letter, please call our office to schedule the follow-up appointment.   

## 2013-07-30 ENCOUNTER — Encounter (HOSPITAL_COMMUNITY)
Admission: RE | Admit: 2013-07-30 | Discharge: 2013-07-30 | Disposition: A | Discharge status: Home / Self Care | Payer: Medicare Other | Source: Ambulatory Visit | Attending: Pulmonary Disease | Admitting: Pulmonary Disease

## 2013-07-31 ENCOUNTER — Ambulatory Visit: Payer: Medicare Other | Admitting: Pulmonary Disease

## 2013-08-01 ENCOUNTER — Encounter (HOSPITAL_COMMUNITY): Payer: Medicare Other

## 2013-08-01 ENCOUNTER — Telehealth (HOSPITAL_COMMUNITY): Payer: Self-pay | Admitting: Internal Medicine

## 2013-08-05 DIAGNOSIS — H25019 Cortical age-related cataract, unspecified eye: Secondary | ICD-10-CM | POA: Diagnosis not present

## 2013-08-05 DIAGNOSIS — H43819 Vitreous degeneration, unspecified eye: Secondary | ICD-10-CM | POA: Diagnosis not present

## 2013-08-05 DIAGNOSIS — H40059 Ocular hypertension, unspecified eye: Secondary | ICD-10-CM | POA: Diagnosis not present

## 2013-08-05 DIAGNOSIS — H2510 Age-related nuclear cataract, unspecified eye: Secondary | ICD-10-CM | POA: Diagnosis not present

## 2013-08-06 ENCOUNTER — Ambulatory Visit (INDEPENDENT_AMBULATORY_CARE_PROVIDER_SITE_OTHER): Payer: Medicare Other | Admitting: General Practice

## 2013-08-06 ENCOUNTER — Encounter (HOSPITAL_COMMUNITY): Payer: Medicare Other

## 2013-08-06 DIAGNOSIS — I2699 Other pulmonary embolism without acute cor pulmonale: Secondary | ICD-10-CM | POA: Diagnosis not present

## 2013-08-06 DIAGNOSIS — Z7901 Long term (current) use of anticoagulants: Secondary | ICD-10-CM | POA: Diagnosis not present

## 2013-08-06 DIAGNOSIS — Z5181 Encounter for therapeutic drug level monitoring: Secondary | ICD-10-CM | POA: Diagnosis not present

## 2013-08-06 LAB — POCT INR: INR: 2.2

## 2013-08-06 NOTE — Progress Notes (Signed)
Pre-visit discussion using our clinic review tool. No additional management support is needed unless otherwise documented below in the visit note.  

## 2013-08-08 ENCOUNTER — Encounter (HOSPITAL_COMMUNITY): Payer: Medicare Other

## 2013-08-08 ENCOUNTER — Encounter (HOSPITAL_COMMUNITY)
Admission: RE | Admit: 2013-08-08 | Discharge: 2013-08-08 | Disposition: A | Discharge status: Home / Self Care | Payer: Medicare Other | Source: Ambulatory Visit | Attending: Pulmonary Disease | Admitting: Pulmonary Disease

## 2013-08-09 DIAGNOSIS — N318 Other neuromuscular dysfunction of bladder: Secondary | ICD-10-CM | POA: Diagnosis not present

## 2013-08-09 DIAGNOSIS — N3020 Other chronic cystitis without hematuria: Secondary | ICD-10-CM | POA: Diagnosis not present

## 2013-08-09 DIAGNOSIS — R300 Dysuria: Secondary | ICD-10-CM | POA: Diagnosis not present

## 2013-08-13 ENCOUNTER — Encounter (HOSPITAL_COMMUNITY): Admission: RE | Admit: 2013-08-13 | Payer: Medicare Other | Source: Ambulatory Visit

## 2013-08-15 ENCOUNTER — Encounter (HOSPITAL_COMMUNITY): Payer: Medicare Other

## 2013-08-20 ENCOUNTER — Encounter (HOSPITAL_COMMUNITY): Payer: Medicare Other

## 2013-08-22 ENCOUNTER — Encounter (HOSPITAL_COMMUNITY)
Admission: RE | Admit: 2013-08-22 | Discharge: 2013-08-22 | Disposition: A | Discharge status: Home / Self Care | Payer: Medicare Other | Source: Ambulatory Visit | Attending: Pulmonary Disease | Admitting: Pulmonary Disease

## 2013-08-22 DIAGNOSIS — J84112 Idiopathic pulmonary fibrosis: Secondary | ICD-10-CM | POA: Diagnosis not present

## 2013-08-22 DIAGNOSIS — J449 Chronic obstructive pulmonary disease, unspecified: Secondary | ICD-10-CM | POA: Insufficient documentation

## 2013-08-22 DIAGNOSIS — Z5189 Encounter for other specified aftercare: Secondary | ICD-10-CM | POA: Diagnosis not present

## 2013-08-22 DIAGNOSIS — Z86711 Personal history of pulmonary embolism: Secondary | ICD-10-CM | POA: Insufficient documentation

## 2013-08-22 DIAGNOSIS — M0510 Rheumatoid lung disease with rheumatoid arthritis of unspecified site: Secondary | ICD-10-CM | POA: Diagnosis not present

## 2013-08-22 DIAGNOSIS — J4489 Other specified chronic obstructive pulmonary disease: Secondary | ICD-10-CM | POA: Insufficient documentation

## 2013-09-06 ENCOUNTER — Ambulatory Visit (INDEPENDENT_AMBULATORY_CARE_PROVIDER_SITE_OTHER): Payer: Medicare Other | Admitting: General Practice

## 2013-09-06 DIAGNOSIS — I2699 Other pulmonary embolism without acute cor pulmonale: Secondary | ICD-10-CM | POA: Diagnosis not present

## 2013-09-06 DIAGNOSIS — Z7901 Long term (current) use of anticoagulants: Secondary | ICD-10-CM

## 2013-09-06 DIAGNOSIS — N3941 Urge incontinence: Secondary | ICD-10-CM | POA: Diagnosis not present

## 2013-09-06 DIAGNOSIS — N318 Other neuromuscular dysfunction of bladder: Secondary | ICD-10-CM | POA: Diagnosis not present

## 2013-09-06 DIAGNOSIS — R3129 Other microscopic hematuria: Secondary | ICD-10-CM | POA: Diagnosis not present

## 2013-09-06 DIAGNOSIS — N3020 Other chronic cystitis without hematuria: Secondary | ICD-10-CM | POA: Diagnosis not present

## 2013-09-06 LAB — POCT INR: INR: 2

## 2013-09-06 NOTE — Progress Notes (Signed)
Pre visit review using our clinic review tool, if applicable. No additional management support is needed unless otherwise documented below in the visit note. 

## 2013-09-16 ENCOUNTER — Encounter: Payer: Self-pay | Admitting: Pulmonary Disease

## 2013-09-16 ENCOUNTER — Ambulatory Visit (INDEPENDENT_AMBULATORY_CARE_PROVIDER_SITE_OTHER): Payer: Medicare Other | Admitting: Pulmonary Disease

## 2013-09-16 VITALS — BP 128/66 | HR 82 | Ht 63.5 in | Wt 159.0 lb

## 2013-09-16 DIAGNOSIS — I2510 Atherosclerotic heart disease of native coronary artery without angina pectoris: Secondary | ICD-10-CM | POA: Diagnosis not present

## 2013-09-16 DIAGNOSIS — Z86718 Personal history of other venous thrombosis and embolism: Secondary | ICD-10-CM

## 2013-09-16 DIAGNOSIS — J8410 Pulmonary fibrosis, unspecified: Secondary | ICD-10-CM

## 2013-09-16 DIAGNOSIS — R0902 Hypoxemia: Secondary | ICD-10-CM | POA: Diagnosis not present

## 2013-09-16 NOTE — Assessment & Plan Note (Signed)
[name] [name] is undifferentiated interstitial lung disease which appears to be related to rheumatoid arthritis is stable. Her symptoms have not progressed and she has normal oxygenation at rest. Her exertional oxygen needs have not changed. At this point, there is no indication for considering treatment. I agree with my colleagues at [facility] who feel that at this point we just need serial monitoring.  Plan: -Continue pulmonary rehabilitation -Remain active after pulmonary rehabilitation -Repeat pulmonary function testing with followup with me in 6 months. - 6 minute walk next visit

## 2013-09-16 NOTE — Progress Notes (Signed)
Subjective:    Patient ID: [name] [name], female    DOB: [birth year], 80 y.o.   MRN: [id number]  Synopsis: This is a very pleasant female who is diagnosed with interstitial lung disease in her mid 51s after evaluation at [facility] and [facility]. She was found to have a UIP pattern on CT scan and serology testing revealed a very high rheumatoid factor as well as anti-CCP antibody. She was followed by Dr. Elgie Collard at [facility] who felt like she had rheumatoid arthritis associated interstitial lung disease. He never treated her with immunosuppressive therapy. From 2000 07/09/2013 she's continued to follow in both [place] and due to her lung function has remained stable during that time. She has not developed new shortness of breath. She has had repeated episodes of pulmonary emboli. The most recent was in the summer of 2014 when she developed her fourth pulmonary embolism after returning from a trip to [place].   HPI   12/31/2012 ROV >> this is a very pleasant 80 year old female with pulmonary fibrosis due to either to idiopathic pulmonary fibrosis versus "rheumatoid lung". She comes her clinic today because she's had increasing shortness of breath and dizziness over the last few days. She dropped to [place] approximately one month ago and had no significant respiratory symptoms during that trip. However she did start taking an antibiotic for a urinary tract infection while she was there. Approximately 3 days prior to today's visit she developed the acute onset of shortness of breath and dizziness. She called paramedics who came out to evaluate her and recommended hospitalization but she decided to stay home instead. She said that she started to feel better while they were there and her vital signs were "normal". On the following day well she was at church she noticed weakness, shortness of breath and dizziness again. The symptoms subsided eventually. This is been  associated with chills, and a cough productive of clear sputum. Today she feels "okay" but she still feels generally weak compared to before. She denies dysuria.  09/16/2013 ROV > [name] [name] has been participating in pulmonary rehab since the last visit.  She feels tired often and has some fatigue, she also complains of constipation some.  She thinks that her dyspnea is about the same, it only comes on with exertion and at rest.  She often feels dyspnic shen she tries to go to bed at night.  She uses 2 O2 with exertion and she has never seen her oxygenation drop out fot eh 90% range.   She has not had new joint pain or swelling.   Past Medical History  Diagnosis Date  . Allergic rhinitis   . History of pulmonary embolism   . DVT (deep venous thrombosis)   . Dyslipidemia   . Pulmonary fibrosis   . DJD (degenerative joint disease)   . Osteoporosis   . CAD (coronary artery disease)     Mild CAD by cath 2008       Review of Systems  Constitutional: Negative for fever, chills and fatigue.  HENT: Negative for congestion, postnasal drip and rhinorrhea.   Respiratory: Negative for cough, shortness of breath and wheezing.   Cardiovascular: Negative for chest pain, palpitations and leg swelling.       Objective:   Physical Exam   Filed Vitals:   09/16/13 0906  BP: 128/66  Pulse: 82  Height: 5' 3.5" (1.613 m)  Weight: 159 lb (72.122 kg)  SpO2: 95%  RA  Walked  500 feet on room air and desaturated to 84%. Started on 2 L with exertion and oxygen saturation remained >  90%  Gen: well appearing, no acute distress HEENT: NCAT, PERRL, EOMi,  PULM: Crackles in bases CV: RRR, no mgr, no JVD AB: BS+, soft, nontender, no hsm Ext: warm, trace bilateral edema, no clubbing, no cyanosis       Assessment & Plan:   Postinflammatory pulmonary fibrosis [name] [name] is undifferentiated interstitial lung disease which appears to be related to rheumatoid arthritis is stable. Her symptoms have not  progressed and she has normal oxygenation at rest. Her exertional oxygen needs have not changed. At this point, there is no indication for considering treatment. I agree with my colleagues at [facility] who feel that at this point we just need serial monitoring.  Plan: -Continue pulmonary rehabilitation -Remain active after pulmonary rehabilitation -Repeat pulmonary function testing with followup with me in 6 months. - 6 minute walk next visit  PULMONARY EMBOLISM, HX OF Continue lifelong warfarin  Hypoxemia Continue 2 L nasal cannula with exertion and each bedtime.    Updated Medication List Outpatient Encounter Prescriptions as of 09/16/2013  Medication Sig  . ALPRAZolam (XANAX) 0.5 MG tablet Take 0.25-0.5 mg by mouth as needed for anxiety. For anxiety  . aspirin EC 81 MG tablet Take 81 mg by mouth daily as needed for mild pain.  . fluticasone (FLONASE) 50 MCG/ACT nasal spray Place 2 sprays into both nostrils daily as needed for allergies or rhinitis.  . Multiple Vitamin (MULTIVITAMIN) capsule Take 1 capsule by mouth daily.    . Probiotic Product (PROBIOTIC DAILY PO) Take by mouth daily.  [name] warfarin (COUMADIN) 5 MG tablet Take 2.5-5 mg by mouth daily. Take 1 tablet (5mg ) on Tuesday and Friday.  Take 1/2 tablet (2.5mg ) on all other days

## 2013-09-16 NOTE — Patient Instructions (Signed)
We will order another Pulmonary function test in 6 months and see you then Continue taking warfarin Restart the daily baby aspirin We will see you back in 6 months or sooner if needed

## 2013-09-16 NOTE — Assessment & Plan Note (Signed)
Continue 2 L nasal cannula with exertion and each bedtime.

## 2013-09-16 NOTE — Assessment & Plan Note (Signed)
Continue lifelong warfarin 

## 2013-09-17 ENCOUNTER — Encounter (HOSPITAL_COMMUNITY)
Admission: RE | Admit: 2013-09-17 | Discharge: 2013-09-17 | Disposition: A | Discharge status: Home / Self Care | Payer: Medicare Other | Source: Ambulatory Visit | Attending: Pulmonary Disease | Admitting: Pulmonary Disease

## 2013-09-17 DIAGNOSIS — M0510 Rheumatoid lung disease with rheumatoid arthritis of unspecified site: Secondary | ICD-10-CM | POA: Insufficient documentation

## 2013-09-17 DIAGNOSIS — J84112 Idiopathic pulmonary fibrosis: Secondary | ICD-10-CM | POA: Insufficient documentation

## 2013-09-17 DIAGNOSIS — J449 Chronic obstructive pulmonary disease, unspecified: Secondary | ICD-10-CM | POA: Insufficient documentation

## 2013-09-17 DIAGNOSIS — Z5189 Encounter for other specified aftercare: Secondary | ICD-10-CM | POA: Diagnosis not present

## 2013-09-17 DIAGNOSIS — Z86711 Personal history of pulmonary embolism: Secondary | ICD-10-CM | POA: Insufficient documentation

## 2013-09-17 DIAGNOSIS — J4489 Other specified chronic obstructive pulmonary disease: Secondary | ICD-10-CM | POA: Insufficient documentation

## 2013-09-19 ENCOUNTER — Encounter (HOSPITAL_COMMUNITY)
Admission: RE | Admit: 2013-09-19 | Discharge: 2013-09-19 | Disposition: A | Discharge status: Home / Self Care | Payer: Medicare Other | Source: Ambulatory Visit | Attending: Pulmonary Disease | Admitting: Pulmonary Disease

## 2013-09-23 ENCOUNTER — Ambulatory Visit (INDEPENDENT_AMBULATORY_CARE_PROVIDER_SITE_OTHER): Payer: Medicare Other | Admitting: Adult Health

## 2013-09-23 ENCOUNTER — Telehealth: Payer: Self-pay | Admitting: Pulmonary Disease

## 2013-09-23 ENCOUNTER — Other Ambulatory Visit (INDEPENDENT_AMBULATORY_CARE_PROVIDER_SITE_OTHER): Payer: Medicare Other

## 2013-09-23 ENCOUNTER — Encounter: Payer: Self-pay | Admitting: Adult Health

## 2013-09-23 VITALS — BP 104/64 | HR 88 | Temp 97.8°F | Ht 63.5 in | Wt 161.8 lb

## 2013-09-23 DIAGNOSIS — I82409 Acute embolism and thrombosis of unspecified deep veins of unspecified lower extremity: Secondary | ICD-10-CM

## 2013-09-23 DIAGNOSIS — I2510 Atherosclerotic heart disease of native coronary artery without angina pectoris: Secondary | ICD-10-CM | POA: Diagnosis not present

## 2013-09-23 LAB — PROTIME-INR
INR: 2.6 ratio — ABNORMAL HIGH (ref 0.8–1.0)
Prothrombin Time: 27.2 s — ABNORMAL HIGH (ref 10.2–12.4)

## 2013-09-23 NOTE — Telephone Encounter (Signed)
Spoke with pt and she c/o sever pain in right calf this am with numbness.  Sore to touch.  Swelling in right leg.  Appt to see TP today at 4:30.

## 2013-09-23 NOTE — Progress Notes (Signed)
Subjective:    Patient ID: [name] [name], female    DOB: October 28, 1930, 80 y.o.   MRN: [id number]  HPI Synopsis: This is a very pleasant female who is diagnosed with interstitial lung disease in her mid 56s after evaluation at [facility] and [facility]. She was found to have a UIP pattern on CT scan and serology testing revealed a very high rheumatoid factor as well as anti-CCP antibody. She was followed by Dr. Elgie Collard at [facility] who felt like she had rheumatoid arthritis associated interstitial lung disease. He never treated her with immunosuppressive therapy. From 2000   07/09/2013 she's continued to follow in both [place] and due to her lung function has remained stable during that time. She has not developed new shortness of breath. She has had repeated episodes of pulmonary emboli. The most recent was in the summer of 2014 when she developed her fourth pulmonary embolism after returning from a trip to [place].   HPI 12/31/2012 ROV >> this is a very pleasant 80 year old female with pulmonary fibrosis due to either to idiopathic pulmonary fibrosis versus "rheumatoid lung". She comes her clinic today because she's had increasing shortness of breath and dizziness over the last few days. She dropped to [place] approximately one month ago and had no significant respiratory symptoms during that trip. However she did start taking an antibiotic for a urinary tract infection while she was there. Approximately 3 days prior to today's visit she developed the acute onset of shortness of breath and dizziness. She called paramedics who came out to evaluate her and recommended hospitalization but she decided to stay home instead. She said that she started to feel better while they were there and her vital signs were "normal". On the following day well she was at church she noticed weakness, shortness of breath and dizziness again. The symptoms subsided eventually. This  is been associated with chills, and a cough productive of clear sputum. Today she feels "okay" but she still feels generally weak compared to before. She denies dysuria.  09/16/2013 ROV > Daisy has been participating in pulmonary rehab since the last visit.  She feels tired often and has some fatigue, she also complains of constipation some.  She thinks that her dyspnea is about the same, it only comes on with exertion and at rest.  She often feels dyspnic shen she tries to go to bed at night.  She uses 2 O2 with exertion and she has never seen her oxygenation drop out fot eh 90% range.   She has not had new joint pain or swelling.   09/23/2013 Acute OV  Complains of right calf pain onset this AM with ankle swelling and tender to touch. Right after getting up this am felt sharp pain in mid upper right calf , for 49min , Pain resolved but calf remains w/ mild soreness.  Denies any redness, heat., chest pain  Increased dyspnea. No recent travel or surgery .  INR 2 weeks ago 2.0.       Review of Systems  Constitutional:   No  weight loss, night sweats,  Fevers, chills, fatigue, or  lassitude.  HEENT:   No headaches,  Difficulty swallowing,  Tooth/dental problems, or  Sore throat,                No sneezing, itching, ear ache, nasal congestion, post nasal drip,   CV:  No chest pain,  Orthopnea, PND, swelling in lower extremities, anasarca, dizziness,  palpitations, syncope.   GI  No heartburn, indigestion, abdominal pain, nausea, vomiting, diarrhea, change in bowel habits, loss of appetite, bloody stools.   Resp:    No chest wall deformity  Skin: no rash or lesions.  GU: no dysuria, change in color of urine, no urgency or frequency.  No flank pain, no hematuria   MS:    No back pain.  Psych:  No change in mood or affect. No depression or anxiety.  No memory loss.        Objective:   Physical Exam GEN: A/Ox3; pleasant , NAD, well nourished   HEENT:  [place]/AT,  EACs-clear, TMs-wnl,  NOSE-clear, THROAT-clear, no lesions, no postnasal drip or exudate noted.   NECK:  Supple w/ fair ROM; no JVD; normal carotid impulses w/o bruits; no thyromegaly or nodules palpated; no lymphadenopathy.  RESP  Clear  P & A; w/o, wheezes/ rales/ or rhonchi.no accessory muscle use, no dullness to percussion  CARD:  RRR, no m/r/g  , tr  peripheral edema, pulses intact, no cyanosis or clubbing. Notable varicose veins bilaterally, mild tenderness along mid upper right calf. No redness noted. Neg homans sign    GI:   Soft & nt; nml bowel sounds; no organomegaly or masses detected.  Musco: Warm bil, no deformities or joint swelling noted.   Neuro: alert, no focal deficits noted.    Skin: Warm, no lesions or rashes         Assessment & Plan:

## 2013-09-23 NOTE — Assessment & Plan Note (Addendum)
Hx DVT /PE on chronic coumadin  -  Right calf pain -? Etiology  Recent therapeutic INR last month.  Will repeat INR  Set up for venous doppler of right   Plan  Set up for venous doppler of leg  Keep leg elevated  INR today  Warm compress to calf As needed   Follow up Dr. Lake Bells as planned and As needed

## 2013-09-23 NOTE — Patient Instructions (Addendum)
Set up for venous doppler of leg  Keep leg elevated  INR today  Warm compress to calf As needed   Follow up Dr. Lake Bells as planned and As needed

## 2013-09-24 ENCOUNTER — Encounter (HOSPITAL_COMMUNITY): Payer: Self-pay | Admitting: Cardiology

## 2013-09-25 ENCOUNTER — Encounter (HOSPITAL_COMMUNITY): Payer: Medicare Other

## 2013-09-25 ENCOUNTER — Telehealth: Payer: Self-pay | Admitting: Adult Health

## 2013-09-25 NOTE — Telephone Encounter (Signed)
Received call from [facility] on 3.17.15 who reported that pt rescheduled her venous doppler d/t her Doristine Bosworth passing away and wanted to attend the funeral service.  Asked Suanne Marker to transfer the call to me to speak with patient directly.  Spoke with patient who stated that her Doristine Bosworth of 44yrs had recently passed away and his funeral service was set for the same date/time that her venous doppler was scheduled.  She had already rescheduled this to 3.19.15 and wanted to know if it was okay for her to attend the service.  TP had already advised pt to not attend Pulmonary Rehab on 3.17.15 because it was inadvisable for pt to do excessive walking.  Informed pt that medically, I would advise her to not attend the service because she does not need to do excessive walking or be on her feet much.  Pt stated that she would not miss this service.  Reiterated to pt that it was not medically advisable for her to attend the service, but if she insists then she needs to be sure that she stays in a wheelchair and keeps her leg elevated as much as possible.  Pt verbalized her understanding.  Per the 3.16.15 ov w/ TP: Patient Instructions     Set up for venous doppler of leg  Keep leg elevated  INR today  Warm compress to calf As needed  Follow up Dr. Lake Bells as planned and As needed    Will sign off.

## 2013-09-26 ENCOUNTER — Ambulatory Visit (HOSPITAL_COMMUNITY): Payer: Medicare Other | Attending: Cardiology | Admitting: *Deleted

## 2013-09-26 ENCOUNTER — Telehealth: Payer: Self-pay

## 2013-09-26 DIAGNOSIS — I82409 Acute embolism and thrombosis of unspecified deep veins of unspecified lower extremity: Secondary | ICD-10-CM | POA: Insufficient documentation

## 2013-09-26 DIAGNOSIS — M79609 Pain in unspecified limb: Secondary | ICD-10-CM | POA: Diagnosis not present

## 2013-09-26 NOTE — Telephone Encounter (Signed)
lmtcb X1 to relay results. 

## 2013-09-26 NOTE — Telephone Encounter (Signed)
Message copied by Len Blalock on Thu Sep 26, 2013  5:11 PM ------      Message from: Simonne Maffucci B      Created: Thu Sep 26, 2013  5:03 PM      Regarding: FW: Prelim results       A,            Please let her know that her ultrasound was negative.            Thanks      B      ----- Message -----         From: Ladene Artist, RVT         Sent: 09/26/2013   3:20 PM           To: Juanito Doom, MD      Subject: Prelim results                                           Patient is negative for acute DVT in the right lower extremity.                   [name] [name]        ------

## 2013-09-26 NOTE — Progress Notes (Signed)
Right lower extremity venous duplex complete

## 2013-09-27 ENCOUNTER — Encounter: Payer: Self-pay | Admitting: Adult Health

## 2013-09-27 ENCOUNTER — Telehealth: Payer: Self-pay

## 2013-09-27 NOTE — Telephone Encounter (Signed)
Message copied by Len Blalock on Fri Sep 27, 2013  5:14 PM ------      Message from: Melvenia Needles      Created: Fri Sep 27, 2013  5:09 PM       ven doppler neg       Cont w/ ov recs ------

## 2013-09-27 NOTE — Telephone Encounter (Signed)
Pt is returning Ashley's call.  °

## 2013-09-27 NOTE — Telephone Encounter (Signed)
lmtcb X1 

## 2013-09-27 NOTE — Telephone Encounter (Signed)
Pt aware of results.  Nothing further needed.  

## 2013-09-27 NOTE — Telephone Encounter (Signed)
Spoke with pt this afternoon and relayed results.  Nothing further needed.

## 2013-10-01 ENCOUNTER — Encounter (HOSPITAL_COMMUNITY)
Admission: RE | Admit: 2013-10-01 | Discharge: 2013-10-01 | Disposition: A | Discharge status: Home / Self Care | Payer: Medicare Other | Source: Ambulatory Visit | Attending: Pulmonary Disease | Admitting: Pulmonary Disease

## 2013-10-01 ENCOUNTER — Encounter (HOSPITAL_COMMUNITY): Payer: Self-pay

## 2013-10-01 ENCOUNTER — Other Ambulatory Visit: Payer: Self-pay

## 2013-10-01 DIAGNOSIS — Z1231 Encounter for screening mammogram for malignant neoplasm of breast: Secondary | ICD-10-CM

## 2013-10-01 NOTE — Progress Notes (Signed)
Today, Beryle exercised at [employer]. Service time was from 10:30 to 12:00.  The patient exercised for more than 31 minutes on different pieces of equipment. Oxygen saturation, heart rate, blood pressure, rate of perceived exertion, and shortness of breath were all monitored before, during, and after exercise. Ornella presented with no problems at today's exercise session.   Pre-exercise vitals:   Weight: 72.7   Liters of O2: 0   SpO2: 93   HR: 82   BP: 90/54   CGB: na  Exercise vitals:   Highest heartrate:  108   Lowest oxygen saturation: 90   Highest blood pressure: 112/64   Liters of O2: 2  Post-exercise vitals:   SpO2: 98   HR: 83   BP: 122/64   CGB: na

## 2013-10-03 ENCOUNTER — Encounter (HOSPITAL_COMMUNITY): Payer: Self-pay

## 2013-10-03 ENCOUNTER — Encounter (HOSPITAL_COMMUNITY)
Admission: RE | Admit: 2013-10-03 | Discharge: 2013-10-03 | Disposition: A | Discharge status: Home / Self Care | Payer: Medicare Other | Source: Ambulatory Visit | Attending: Pulmonary Disease | Admitting: Pulmonary Disease

## 2013-10-03 NOTE — Progress Notes (Signed)
Today, [name] [name] exercised at [employer]. Service time was from 10:30 to 12:30.  The patient exercised for more than 31 minutes on different pieces of equipment. [name] [name] also performed strength training for 10 minutes.  Oxygen saturation, heart rate, blood pressure, rate of perceived exertion, and shortness of breath were all monitored before, during, and after exercise. [name] [name] presented with no problems at today's exercise session. The patient attended the education class "Nutrition for the Pulmonary Patient" with [name] [name] our Dietician today.  Pre-exercise vitals:   Weight kg: 72.8   Liters of O2: 2   SpO2: 99   HR: 85   BP: 108/60   CGB: na  Exercise vitals:   Highest heartrate:  93   Lowest oxygen saturation: 93   Highest blood pressure: 138/60   Liters of O2: 2  Post-exercise vitals:   SpO2: 99   HR: 82   BP: 100/60   CGB: na   Dr. Brand Males, Medical Director Dr. Ree Kida is immediately available during today's Pulmonary Rehab session for [facility].

## 2013-10-08 ENCOUNTER — Encounter (HOSPITAL_COMMUNITY): Payer: Self-pay

## 2013-10-08 ENCOUNTER — Encounter (HOSPITAL_COMMUNITY): Payer: Medicare Other

## 2013-10-08 ENCOUNTER — Encounter (HOSPITAL_COMMUNITY)
Admission: RE | Admit: 2013-10-08 | Discharge: 2013-10-08 | Disposition: A | Discharge status: Home / Self Care | Payer: Medicare Other | Source: Ambulatory Visit | Attending: Pulmonary Disease | Admitting: Pulmonary Disease

## 2013-10-08 NOTE — Progress Notes (Signed)
Today, [name] [name] exercised at [employer]. Service time was from 10:30 to 12:30.  The patient exercised for more than 31 minutes performing aerobic, strengthening, and stretching exercises. Oxygen saturation, heart rate, blood pressure, rate of perceived exertion, and shortness of breath were all monitored before, during, and after exercise. Carissa presented with no problems at today's exercise session.   Pre-exercise vitals:   Weight kg: 72.6   Liters of O2: ra   SpO2: 97   HR: 93   BP: 126/64   CBG: na  Exercise vitals:   Highest heartrate:  98   Lowest oxygen saturation: 97   Highest blood pressure: 100/60   Liters of 02: 2  Post-exercise vitals:   SpO2: 99   HR: 76   BP: 94/60   Liters of O2: 2   CBG: na   Dr. Brand Males, Medical Director Dr. Ree Kida is immediately available during today's Pulmonary Rehab session for [name] [name] on 10/08/13 at 10:30am class time.

## 2013-10-10 ENCOUNTER — Encounter (HOSPITAL_COMMUNITY): Payer: Self-pay

## 2013-10-10 ENCOUNTER — Encounter (HOSPITAL_COMMUNITY): Payer: Medicare Other

## 2013-10-10 ENCOUNTER — Encounter (HOSPITAL_COMMUNITY)
Admission: RE | Admit: 2013-10-10 | Discharge: 2013-10-10 | Disposition: A | Discharge status: Home / Self Care | Payer: Medicare Other | Source: Ambulatory Visit | Attending: Pulmonary Disease | Admitting: Pulmonary Disease

## 2013-10-10 DIAGNOSIS — J449 Chronic obstructive pulmonary disease, unspecified: Secondary | ICD-10-CM | POA: Insufficient documentation

## 2013-10-10 DIAGNOSIS — Z5189 Encounter for other specified aftercare: Secondary | ICD-10-CM | POA: Insufficient documentation

## 2013-10-10 DIAGNOSIS — M0510 Rheumatoid lung disease with rheumatoid arthritis of unspecified site: Secondary | ICD-10-CM | POA: Diagnosis not present

## 2013-10-10 DIAGNOSIS — J84112 Idiopathic pulmonary fibrosis: Secondary | ICD-10-CM | POA: Diagnosis not present

## 2013-10-10 DIAGNOSIS — Z86711 Personal history of pulmonary embolism: Secondary | ICD-10-CM | POA: Diagnosis not present

## 2013-10-10 DIAGNOSIS — J4489 Other specified chronic obstructive pulmonary disease: Secondary | ICD-10-CM | POA: Insufficient documentation

## 2013-10-10 NOTE — Progress Notes (Signed)
Today, [name] [name] exercised at [employer]. Service time was from 10:30 to 12:30.  The patient exercised for more than 31 minutes performing aerobic, strengthening, and stretching exercises. Oxygen saturation, heart rate, blood pressure, rate of perceived exertion, and shortness of breath were all monitored before, during, and after exercise. [name] [name] presented with no problems at today's exercise session. The patient attended the Warning signs, symptoms, and environment education session.  There was no workload change during today's exercise session.  Pre-exercise vitals:   Weight kg: 72.0   Liters of O2: 0   SpO2: 96   HR: 93   BP: 104/60   CBG: na  Exercise vitals:   Highest heartrate:  106   Lowest oxygen saturation: 96   Highest blood pressure: 134/66   Liters of 02: 2  Post-exercise vitals:   SpO2: 100   HR: 78   BP: 96/54   Liters of O2: 2   CBG: na  Dr. Brand Males, Medical Director Dr. Aileen Fass is immediately available during today's Pulmonary Rehab session for [name] [name] on 10/10/13 at 10:30am class time.

## 2013-10-15 ENCOUNTER — Encounter (HOSPITAL_COMMUNITY): Payer: Medicare Other

## 2013-10-15 ENCOUNTER — Encounter (HOSPITAL_COMMUNITY): Payer: Self-pay

## 2013-10-15 ENCOUNTER — Encounter (HOSPITAL_COMMUNITY)
Admission: RE | Admit: 2013-10-15 | Discharge: 2013-10-15 | Disposition: A | Discharge status: Home / Self Care | Payer: Medicare Other | Source: Ambulatory Visit | Attending: Pulmonary Disease | Admitting: Pulmonary Disease

## 2013-10-15 NOTE — Progress Notes (Signed)
Today, [name] [name] exercised at [employer]. Service time was from 1:30 to 3:30 .  The patient exercised for more than 31 minutes performing aerobic, strengthening, and stretching exercises. Oxygen saturation, heart rate, blood pressure, rate of perceived exertion, and shortness of breath were all monitored before, during, and after exercise. [name] [name] presented with no problems at today's exercise session.   There was an increase workload change during today's exercise session.  Pre-exercise vitals:   Weight kg: 72.1   Liters of O2: 0   SpO2: 96   HR: 90   BP: 100/56   CBG: na  Exercise vitals:   Highest heartrate:  118   Lowest oxygen saturation: 94   Highest blood pressure: 108/60   Liters of 02: 2  Post-exercise vitals:   SpO2: 98   HR: 80   BP: 103/70   Liters of O2: 2   CBG: na  Dr. Brand Males, Medical Director Dr. Aileen Fass is immediately available during today's Pulmonary Rehab session for [name] [name] on 10/15/13 at 1:30 class time.

## 2013-10-17 ENCOUNTER — Encounter (HOSPITAL_COMMUNITY): Payer: Medicare Other

## 2013-10-17 ENCOUNTER — Encounter (HOSPITAL_COMMUNITY): Payer: Self-pay

## 2013-10-17 ENCOUNTER — Telehealth: Payer: Self-pay | Admitting: Sports Medicine

## 2013-10-17 ENCOUNTER — Encounter (HOSPITAL_COMMUNITY)
Admission: RE | Admit: 2013-10-17 | Discharge: 2013-10-17 | Disposition: A | Discharge status: Home / Self Care | Payer: Medicare Other | Source: Ambulatory Visit | Attending: Pulmonary Disease | Admitting: Pulmonary Disease

## 2013-10-17 DIAGNOSIS — J8410 Pulmonary fibrosis, unspecified: Secondary | ICD-10-CM

## 2013-10-17 NOTE — Progress Notes (Signed)
[name] [name] completed a Six-Minute Walk Test on 10/17/13 . [name] [name] walked 1,152 feet with 0 breaks.  The patient's lowest oxygen saturation was 95 , highest heart rate was 108 , and highest blood pressure was 130/68. The patient was on 2 liters.

## 2013-10-17 NOTE — Telephone Encounter (Signed)
lmomtcb x1 for [employer]

## 2013-10-18 ENCOUNTER — Ambulatory Visit (INDEPENDENT_AMBULATORY_CARE_PROVIDER_SITE_OTHER): Payer: Medicare Other | Admitting: General Practice

## 2013-10-18 DIAGNOSIS — Z7901 Long term (current) use of anticoagulants: Secondary | ICD-10-CM

## 2013-10-18 DIAGNOSIS — Z5181 Encounter for therapeutic drug level monitoring: Secondary | ICD-10-CM | POA: Diagnosis not present

## 2013-10-18 DIAGNOSIS — I2699 Other pulmonary embolism without acute cor pulmonale: Secondary | ICD-10-CM

## 2013-10-18 LAB — POCT INR: INR: 1.4

## 2013-10-18 NOTE — Progress Notes (Signed)
Pre visit review using our clinic review tool, if applicable. No additional management support is needed unless otherwise documented below in the visit note. 

## 2013-10-18 NOTE — Telephone Encounter (Signed)
LMTCBx2 for [name] she is not in office today. Clarendon Bing, CMA

## 2013-10-21 NOTE — Telephone Encounter (Signed)
I have placed orders for exertional o2 and ONO. lmomtcb to inform pt

## 2013-10-21 NOTE — Telephone Encounter (Signed)
ONO OK by me

## 2013-10-21 NOTE — Telephone Encounter (Signed)
I spoke with [name] at pulmonary rehab and she states that the pt needs exertional oxygen. She states that the pt has used 2 liters with exertion that whole time she has been in rehab and they thought the pt had oxygen at home for activity but she does not. I spoke with the pt to verify this information and she states she does not have any oxygen at home.  Per last OV note with Dr. Lake Bells: Hypoxemia - Juanito Doom, MD at 09/16/2013 7:07 PM     Status: Written Related Problem: Hypoxemia    Continue 2 L nasal cannula with exertion and each bedtime.   Dr. Lake Bells, we can place the order for the exertional oxygen based off of the walk test from the last OV, but in order for the pt to have oxygen at bedtime she will need an ONO. Please advise if ok to order an ONO for this pt? If ok then order needs to be placed for both exertional oxygen and the ONO.

## 2013-10-22 ENCOUNTER — Encounter (HOSPITAL_COMMUNITY): Payer: Medicare Other

## 2013-10-22 ENCOUNTER — Ambulatory Visit
Admission: RE | Admit: 2013-10-22 | Discharge: 2013-10-22 | Disposition: A | Discharge status: Home / Self Care | Payer: Medicare Other | Source: Ambulatory Visit

## 2013-10-22 DIAGNOSIS — Z1231 Encounter for screening mammogram for malignant neoplasm of breast: Secondary | ICD-10-CM

## 2013-10-22 NOTE — Telephone Encounter (Signed)
LMTCBx2 to inform the pt orders placed for ONO and oxygen. [place] Bing, CMA

## 2013-10-22 NOTE — Telephone Encounter (Signed)
Spoke with pt this AM. Nothing further needed

## 2013-10-22 NOTE — Telephone Encounter (Signed)
Pt is returning call.  Pt states that she doesn't need a returned call at this time.  DME advised pt she needs to see BQ again before any of this can be completed & insurance to cover the cost.  Pt has appt w/ BQ on 11/05/13.  [name] [name]

## 2013-10-24 ENCOUNTER — Encounter (HOSPITAL_COMMUNITY): Payer: Medicare Other

## 2013-10-26 DIAGNOSIS — J8410 Pulmonary fibrosis, unspecified: Secondary | ICD-10-CM | POA: Diagnosis not present

## 2013-10-29 ENCOUNTER — Encounter: Payer: Self-pay | Admitting: Pulmonary Disease

## 2013-10-29 ENCOUNTER — Encounter (HOSPITAL_COMMUNITY): Payer: Medicare Other

## 2013-10-29 ENCOUNTER — Ambulatory Visit (INDEPENDENT_AMBULATORY_CARE_PROVIDER_SITE_OTHER): Payer: Medicare Other | Admitting: General Practice

## 2013-10-29 DIAGNOSIS — Z5181 Encounter for therapeutic drug level monitoring: Secondary | ICD-10-CM

## 2013-10-29 LAB — POCT INR: INR: 2

## 2013-10-29 NOTE — Progress Notes (Signed)
Pre visit review using our clinic review tool, if applicable. No additional management support is needed unless otherwise documented below in the visit note. 

## 2013-10-31 ENCOUNTER — Encounter (HOSPITAL_COMMUNITY): Payer: Medicare Other

## 2013-11-04 ENCOUNTER — Encounter: Payer: Self-pay | Admitting: Pulmonary Disease

## 2013-11-04 ENCOUNTER — Telehealth: Payer: Self-pay

## 2013-11-04 NOTE — Telephone Encounter (Signed)
I spoke with the patient, she states that the test was on room air, and she does not have 02 at home, only at rehab.  She is aware of the results.  Nothing further needed at this time.

## 2013-11-04 NOTE — Telephone Encounter (Signed)
Message copied by Len Blalock on Mon Nov 04, 2013  4:25 PM ------      Message from: Simonne Maffucci B      Created: Mon Nov 04, 2013  3:06 AM       A,            Can we confirm that her ONO was really done on RA?  She had previously been on 2L [place].            Her ONO was completely normal            Thanks      B ------

## 2013-11-05 ENCOUNTER — Ambulatory Visit: Payer: Medicare Other | Admitting: Pulmonary Disease

## 2013-11-05 ENCOUNTER — Encounter (HOSPITAL_COMMUNITY): Payer: Medicare Other

## 2013-11-07 ENCOUNTER — Encounter (HOSPITAL_COMMUNITY): Payer: Medicare Other

## 2013-11-12 ENCOUNTER — Emergency Department (HOSPITAL_COMMUNITY): Payer: Medicare Other

## 2013-11-12 ENCOUNTER — Encounter (HOSPITAL_COMMUNITY): Payer: Self-pay | Admitting: Emergency Medicine

## 2013-11-12 ENCOUNTER — Emergency Department (HOSPITAL_COMMUNITY)
Admission: EM | Admit: 2013-11-12 | Discharge: 2013-11-13 | Disposition: A | Discharge status: Home / Self Care | Payer: Medicare Other | Attending: Emergency Medicine | Admitting: Emergency Medicine

## 2013-11-12 DIAGNOSIS — S199XXA Unspecified injury of neck, initial encounter: Secondary | ICD-10-CM | POA: Diagnosis not present

## 2013-11-12 DIAGNOSIS — R51 Headache: Secondary | ICD-10-CM | POA: Diagnosis not present

## 2013-11-12 DIAGNOSIS — S0083XA Contusion of other part of head, initial encounter: Secondary | ICD-10-CM | POA: Diagnosis not present

## 2013-11-12 DIAGNOSIS — Z8739 Personal history of other diseases of the musculoskeletal system and connective tissue: Secondary | ICD-10-CM | POA: Diagnosis not present

## 2013-11-12 DIAGNOSIS — IMO0002 Reserved for concepts with insufficient information to code with codable children: Secondary | ICD-10-CM | POA: Diagnosis not present

## 2013-11-12 DIAGNOSIS — W108XXA Fall (on) (from) other stairs and steps, initial encounter: Secondary | ICD-10-CM | POA: Insufficient documentation

## 2013-11-12 DIAGNOSIS — Z86718 Personal history of other venous thrombosis and embolism: Secondary | ICD-10-CM | POA: Diagnosis not present

## 2013-11-12 DIAGNOSIS — Z86711 Personal history of pulmonary embolism: Secondary | ICD-10-CM | POA: Diagnosis not present

## 2013-11-12 DIAGNOSIS — Z880 Allergy status to penicillin: Secondary | ICD-10-CM | POA: Insufficient documentation

## 2013-11-12 DIAGNOSIS — Z862 Personal history of diseases of the blood and blood-forming organs and certain disorders involving the immune mechanism: Secondary | ICD-10-CM | POA: Diagnosis not present

## 2013-11-12 DIAGNOSIS — S0990XA Unspecified injury of head, initial encounter: Secondary | ICD-10-CM | POA: Diagnosis not present

## 2013-11-12 DIAGNOSIS — Z8639 Personal history of other endocrine, nutritional and metabolic disease: Secondary | ICD-10-CM | POA: Insufficient documentation

## 2013-11-12 DIAGNOSIS — Z79899 Other long term (current) drug therapy: Secondary | ICD-10-CM | POA: Insufficient documentation

## 2013-11-12 DIAGNOSIS — Z87891 Personal history of nicotine dependence: Secondary | ICD-10-CM | POA: Insufficient documentation

## 2013-11-12 DIAGNOSIS — W19XXXA Unspecified fall, initial encounter: Secondary | ICD-10-CM

## 2013-11-12 DIAGNOSIS — S0003XA Contusion of scalp, initial encounter: Secondary | ICD-10-CM | POA: Insufficient documentation

## 2013-11-12 DIAGNOSIS — Z7901 Long term (current) use of anticoagulants: Secondary | ICD-10-CM | POA: Insufficient documentation

## 2013-11-12 DIAGNOSIS — Z23 Encounter for immunization: Secondary | ICD-10-CM | POA: Diagnosis not present

## 2013-11-12 DIAGNOSIS — S0993XA Unspecified injury of face, initial encounter: Secondary | ICD-10-CM | POA: Diagnosis not present

## 2013-11-12 DIAGNOSIS — I2510 Atherosclerotic heart disease of native coronary artery without angina pectoris: Secondary | ICD-10-CM | POA: Diagnosis not present

## 2013-11-12 DIAGNOSIS — Y9301 Activity, walking, marching and hiking: Secondary | ICD-10-CM | POA: Insufficient documentation

## 2013-11-12 DIAGNOSIS — Z8709 Personal history of other diseases of the respiratory system: Secondary | ICD-10-CM | POA: Diagnosis not present

## 2013-11-12 DIAGNOSIS — S1093XA Contusion of unspecified part of neck, initial encounter: Secondary | ICD-10-CM | POA: Diagnosis not present

## 2013-11-12 DIAGNOSIS — Y92009 Unspecified place in unspecified non-institutional (private) residence as the place of occurrence of the external cause: Secondary | ICD-10-CM | POA: Insufficient documentation

## 2013-11-12 DIAGNOSIS — T1490XA Injury, unspecified, initial encounter: Secondary | ICD-10-CM | POA: Diagnosis not present

## 2013-11-12 DIAGNOSIS — L089 Local infection of the skin and subcutaneous tissue, unspecified: Secondary | ICD-10-CM | POA: Diagnosis not present

## 2013-11-12 MED ORDER — TETANUS-DIPHTH-ACELL PERTUSSIS 5-2.5-18.5 LF-MCG/0.5 IM SUSP
0.5000 mL | Freq: Once | INTRAMUSCULAR | Status: AC
  Administered 2013-11-12: 0.5 mL via INTRAMUSCULAR
  Filled 2013-11-12: qty 0.5

## 2013-11-12 NOTE — ED Notes (Signed)
EMS- Pt fell down 3 steps striking right side of head on a branch. Pt denies loc, alert x4 mae randomly. Pt takes coumadin.

## 2013-11-13 NOTE — Discharge Instructions (Signed)
Your CT scan today of your head and neck were negative for acute injuries. May take over the counter medications as needed for headache. Follow-up with your primary care physician if problems occur. Return to the ED for new or worsening symptoms.

## 2013-11-13 NOTE — ED Provider Notes (Signed)
CSN: [id number]     Arrival date & time 11/12/13  2159 History   First MD Initiated Contact with Patient 11/12/13 2224     Chief Complaint  Patient presents with  . Fall     (Consider location/radiation/quality/duration/timing/severity/associated sxs/prior Treatment) The history is provided by the patient and medical records.   This is an 80 year old female with past medical history significant for dyslipidemia, pulmonary fibrosis, coronary artery disease, history of DVT and PE currently on Coumadin, presenting to the ED for fall. Patient states she was walking down the front steps of her home, lost her footing and fell into the bushes beside the steps. She denies loss of consciousness.  Patient states she has a mild headache localized area of impact. She denies any visual disturbance, dizziness, tinnitus, changes in speech, confusion, nausea, vomiting, or difficulty ambulating. Patient states at baseline she walks unassisted and has ambulated since injury. Patient has no other complaints at this time.  Date of last tetanus unknown.  Past Medical History  Diagnosis Date  . Allergic rhinitis   . History of pulmonary embolism   . DVT (deep venous thrombosis)   . Dyslipidemia   . Pulmonary fibrosis   . DJD (degenerative joint disease)   . Osteoporosis   . CAD (coronary artery disease)     Mild CAD by cath 2008   Past Surgical History  Procedure Laterality Date  . Abdominal hysterectomy    . Appendectomy    . Total knee arthroplasty  1997    Bilateral  . Tonsillectomy     Family History  Problem Relation Age of Onset  . Kidney disease Daughter 5  . Cancer Daughter   . Heart attack Father 63  . Alzheimer's disease Sister    History  Substance Use Topics  . Smoking status: Former Smoker -- 0.30 packs/day for 20 years    Types: Cigarettes    Quit date: 07/11/1973  . Smokeless tobacco: Never Used  . Alcohol Use: 0.0 oz/week     Comment: approx 1 glass wine per week   OB  History   Grav Para Term Preterm Abortions TAB SAB Ect Mult Living                 Review of Systems  Skin: Positive for wound.  All other systems reviewed and are negative.   Allergies  Crestor; Lactose intolerance (gi); Penicillins; and Niacin  Home Medications   Prior to Admission medications   Medication Sig Start Date End Date Taking? Authorizing Provider  ALPRAZolam Duanne Moron) 0.5 MG tablet Take 0.25 mg by mouth as needed for anxiety.    Yes Historical Provider, MD  cetirizine (ZYRTEC) 10 MG tablet Take 5-10 mg by mouth daily as needed for allergies.   Yes Historical Provider, MD  fluticasone (FLONASE) 50 MCG/ACT nasal spray Place 2 sprays into both nostrils daily as needed for allergies or rhinitis.   Yes Historical Provider, MD  Polyethyl Glycol-Propyl Glycol (SYSTANE) 0.4-0.3 % SOLN Apply 1 drop to eye daily as needed (for dry eyes).   Yes Historical Provider, MD  Probiotic Product (PROBIOTIC DAILY PO) Take by mouth daily.   Yes Historical Provider, MD  warfarin (COUMADIN) 5 MG tablet Take 2.5-5 mg by mouth daily at 6 PM. Take 1 tablet (5mg ) on Tuesday and Friday.  Take 1/2 tablet (2.5mg ) on all other days   Yes Historical Provider, MD   BP 107/95  Pulse 73  Resp 20  SpO2 97%  Physical Exam  Nursing  note and vitals reviewed. Constitutional: She is oriented to person, place, and time. She appears well-developed and well-nourished.  HENT:  Head: Normocephalic. Head is with abrasion and with contusion.    Nose: Nose normal.  Mouth/Throat: Uvula is midline, oropharynx is clear and moist and mucous membranes are normal. No oropharyngeal exudate, posterior oropharyngeal edema, posterior oropharyngeal erythema or tonsillar abscesses.  Small abrasion and hematoma to right anterior scalp; tender to palpation; no active bleeding; no foreign body or signs of infection  Eyes: Conjunctivae and EOM are normal. Pupils are equal, round, and reactive to light.  Neck: Normal range of  motion.  Cardiovascular: Normal rate, regular rhythm and normal heart sounds.   Pulmonary/Chest: Effort normal and breath sounds normal.  Abdominal: Soft. Bowel sounds are normal.  Musculoskeletal: Normal range of motion.  No pelvic instability or long bone deformities  Neurological: She is alert and oriented to person, place, and time. She has normal strength. She displays no tremor. No cranial nerve deficit or sensory deficit. She displays no seizure activity.  AAOx3, answering questions and following commands appropriately; equal strength UE and LE bilaterally; CN grossly intact; moves all extremities appropriately without ataxia; no focal neuro deficits or facial asymmetry appreciated  Skin: Skin is warm and dry.  Psychiatric: She has a normal mood and affect.    ED Course  Procedures (including critical care time) Labs Review Labs Reviewed - No data to display  Imaging Review Ct Head Wo Contrast  11/12/2013   CLINICAL DATA:  Fall on Coumadin  EXAM: CT HEAD WITHOUT CONTRAST  CT CERVICAL SPINE WITHOUT CONTRAST  TECHNIQUE: Multidetector CT imaging of the head and cervical spine was performed following the standard protocol without intravenous contrast. Multiplanar CT image reconstructions of the cervical spine were also generated.  COMPARISON:  12/13/2006  FINDINGS: CT HEAD FINDINGS  Skull and Sinuses:Secretions present within the expansive left sphenoid sinus. No associated mucosal thickening to suggest acute, infectious sinusitis. No acute osseous injuries.  Orbits: No acute abnormality.  Brain: No evidence of acute abnormality, such as acute infarction, hemorrhage, hydrocephalus, or mass lesion/mass effect. Generalized cerebral volume loss.  CT CERVICAL SPINE FINDINGS  Negative for acute fracture or subluxation. No prevertebral edema. No gross cervical canal hematoma. Diffuse degenerative disc disease, with disc narrowing maximal at C5-6 and C6-7, where there is also posterior osteophytic  ridging. No significant osseous canal or foraminal stenosis.  IMPRESSION: Negative for acute intracranial or cervical spine injury.   Electronically Signed   By: Jorje Guild M.D.   On: 11/12/2013 23:58   Ct Cervical Spine Wo Contrast  11/12/2013   CLINICAL DATA:  Fall on Coumadin  EXAM: CT HEAD WITHOUT CONTRAST  CT CERVICAL SPINE WITHOUT CONTRAST  TECHNIQUE: Multidetector CT imaging of the head and cervical spine was performed following the standard protocol without intravenous contrast. Multiplanar CT image reconstructions of the cervical spine were also generated.  COMPARISON:  12/13/2006  FINDINGS: CT HEAD FINDINGS  Skull and Sinuses:Secretions present within the expansive left sphenoid sinus. No associated mucosal thickening to suggest acute, infectious sinusitis. No acute osseous injuries.  Orbits: No acute abnormality.  Brain: No evidence of acute abnormality, such as acute infarction, hemorrhage, hydrocephalus, or mass lesion/mass effect. Generalized cerebral volume loss.  CT CERVICAL SPINE FINDINGS  Negative for acute fracture or subluxation. No prevertebral edema. No gross cervical canal hematoma. Diffuse degenerative disc disease, with disc narrowing maximal at C5-6 and C6-7, where there is also posterior osteophytic ridging. No significant osseous canal  or foraminal stenosis.  IMPRESSION: Negative for acute intracranial or cervical spine injury.   Electronically Signed   By: Jorje Guild M.D.   On: 11/12/2013 23:58     EKG Interpretation None      MDM   Final diagnoses:  Fall  Head injury   80 year old female presented to the status post fall at home with head trauma. No loss of consciousness. Patient is on Coumadin for DVT/PE.  Neuro exam is non-focal.  Will obtain CT head and CS.  Pt recently had INR checked on 4/21-- was 2.0.  Imaging negative for acute findings.  Tetanus updated.  Patient's neuro exam remains nonfocal and she continues to be without other complaints. No  signs/sx concerning for concussion.  She will be discharged home and instructed to followup with her primary care physician.  Discussed plan with patient, he/she acknowledged understanding and agreed with plan of care.  Return precautions given for new or worsening symptoms.  Larene Pickett, PA-C 11/13/13 [phone]

## 2013-11-14 ENCOUNTER — Ambulatory Visit (INDEPENDENT_AMBULATORY_CARE_PROVIDER_SITE_OTHER): Payer: Medicare Other | Admitting: Pulmonary Disease

## 2013-11-14 ENCOUNTER — Encounter: Payer: Self-pay | Admitting: Pulmonary Disease

## 2013-11-14 ENCOUNTER — Ambulatory Visit: Payer: Medicare Other | Admitting: Pulmonary Disease

## 2013-11-14 VITALS — BP 118/70 | HR 85 | Ht 64.0 in | Wt 158.0 lb

## 2013-11-14 DIAGNOSIS — Z86718 Personal history of other venous thrombosis and embolism: Secondary | ICD-10-CM

## 2013-11-14 DIAGNOSIS — R0902 Hypoxemia: Secondary | ICD-10-CM

## 2013-11-14 DIAGNOSIS — I2510 Atherosclerotic heart disease of native coronary artery without angina pectoris: Secondary | ICD-10-CM

## 2013-11-14 DIAGNOSIS — J8410 Pulmonary fibrosis, unspecified: Secondary | ICD-10-CM

## 2013-11-14 NOTE — Progress Notes (Signed)
Subjective:    Patient ID: [name] [name], female    DOB: Nov 08, 1930, 80 y.o.   MRN: [id number]  Synopsis: This is a very pleasant female who is diagnosed with interstitial lung disease in her mid 80s after evaluation at [facility] and [facility]. She was found to have a UIP pattern on CT scan and serology testing revealed a very high rheumatoid factor as well as anti-CCP antibody. She was followed by Dr. Elgie Collard at [facility] who felt like she had rheumatoid arthritis associated interstitial lung disease. He never treated her with immunosuppressive therapy. From 2000 07/09/2013 she's continued to follow in both [place] and due to her lung function has remained stable during that time. She has not developed new shortness of breath. She has had repeated episodes of pulmonary emboli. The most recent was in the summer of 2014 when she developed her fourth pulmonary embolism after returning from a trip to [place].   HPI   12/31/2012 ROV >> this is a very pleasant 80 year old female with pulmonary fibrosis due to either to idiopathic pulmonary fibrosis versus "rheumatoid lung". She comes her clinic today because she's had increasing shortness of breath and dizziness over the last few days. She dropped to [place] approximately one month ago and had no significant respiratory symptoms during that trip. However she did start taking an antibiotic for a urinary tract infection while she was there. Approximately 3 days prior to today's visit she developed the acute onset of shortness of breath and dizziness. She called paramedics who came out to evaluate her and recommended hospitalization but she decided to stay home instead. She said that she started to feel better while they were there and her vital signs were "normal". On the following day well she was at church she noticed weakness, shortness of breath and dizziness again. The symptoms subsided eventually. This is  been associated with chills, and a cough productive of clear sputum. Today she feels "okay" but she still feels generally weak compared to before. She denies dysuria.  09/16/2013 ROV > Zollie has been participating in pulmonary rehab since the last visit.  She feels tired often and has some fatigue, she also complains of constipation some.  She thinks that her dyspnea is about the same, it only comes on with exertion and at rest.  She often feels dyspnic shen she tries to go to bed at night.  She uses 2 O2 with exertion and she has never seen her oxygenation drop out fot eh 90% range at pulmonary rehabilitation. She has not had new joint pain or swelling.   11/14/2013> [name] returns to see me today because she had recently been evaluated for some leg swelling. An ultrasound showed no evidence of pulmonary embolism. She does not have much swelling now but she does still have some tenderness. She's not had fevers or chills or redness. Unfortunately she tripped on a new doorstop which have been installed in her door a few days ago and she fell and hit her head. She went to the emergency room and a CT scan of her head which is negative. She has not had bleeding since then. She continues to take her warfarin.  She has questions for me today about her oxygen. Apparently her oxygen saturation has been measured at exercise rehabilitation with a finger probe she is required 2 L of oxygen.  Past Medical History  Diagnosis Date  . Allergic rhinitis   . History of pulmonary embolism   .  DVT (deep venous thrombosis)   . Dyslipidemia   . Pulmonary fibrosis   . DJD (degenerative joint disease)   . Osteoporosis   . CAD (coronary artery disease)     Mild CAD by cath 2008       Review of Systems  Constitutional: Negative for fever, chills and fatigue.  HENT: Negative for congestion, postnasal drip and rhinorrhea.   Respiratory: Negative for cough, shortness of breath and wheezing.   Cardiovascular: Negative  for chest pain, palpitations and leg swelling.       Objective:   Physical Exam   Filed Vitals:   11/14/13 1117  BP: 118/70  Pulse: 85  Height: 5\' 4"  (1.626 m)  Weight: 158 lb (71.668 kg)  SpO2: 90%  RA  Ambulated 500 feet on RA (forehead probe) and O2 saturation dropped no lower than 90%   Gen: well appearing, no acute distress HEENT: NCAT, PERRL, EOMi,  PULM: Crackles in bases CV: RRR, no mgr, no JVD AB: BS+, soft, nontender, no hsm Ext: warm, trace bilateral edema, no clubbing, no cyanosis       Assessment & Plan:   PULMONARY EMBOLISM, HX OF She recently had a mechanical fall with minor head trauma. CT scan of her head was normal. Based on her description of the fall it sounds like this is due to a problem with new hardware installed on her door and not a medical problem or a problem of instability.  Plan:  -Continue warfarin lifelong - If falls recur then we will need to reconsider her anticoagulation  Postinflammatory pulmonary fibrosis Today in clinic we walked her 500 feet on room air and her oxygen saturation dropped no lower than 90%. I believe that her oxygen saturation drops lower when measured with a finger probe as opposed to the forehead probe.  Whenever the oxygen has been measured with the forehead probe her oxygen level is never lower than 90% on ambulation. Therefore, she is never qualified for oxygen.  Her disease has been stable with expectant therapy.  Plan: -Followup with me as previously scheduled, -6 minute walk with  next visit    Updated Medication List Outpatient Encounter Prescriptions as of 11/14/2013  Medication Sig  . ALPRAZolam (XANAX) 0.5 MG tablet Take 0.25 mg by mouth as needed for anxiety.   . cetirizine (ZYRTEC) 10 MG tablet Take 5-10 mg by mouth daily as needed for allergies.  . fluticasone (FLONASE) 50 MCG/ACT nasal spray Place 2 sprays into both nostrils daily as needed for allergies or rhinitis.  [name] Glycol-Propyl  Glycol (SYSTANE) 0.4-0.3 % SOLN Apply 1 drop to eye daily as needed (for dry eyes).  . Probiotic Product (PROBIOTIC DAILY PO) Take by mouth daily.  [name] warfarin (COUMADIN) 5 MG tablet Take 2.5-5 mg by mouth daily at 6 PM. Take 1 tablet (5mg ) on Tuesday and Friday.  Take 1/2 tablet (2.5mg ) on all other days

## 2013-11-14 NOTE — Patient Instructions (Signed)
Keep taking your warfarin as prescribed We will see you back as previously scheduled

## 2013-11-14 NOTE — Assessment & Plan Note (Addendum)
Today in clinic we walked her 500 feet on room air and her oxygen saturation dropped no lower than 90%. I believe that her oxygen saturation drops lower when measured with a finger probe as opposed to the forehead probe.  Whenever the oxygen has been measured with the forehead probe her oxygen level is never lower than 90% on ambulation. Therefore, she is never qualified for oxygen.  Her disease has been stable with expectant therapy.  Plan: -Followup with me as previously scheduled, -6 minute walk with  next visit

## 2013-11-14 NOTE — Assessment & Plan Note (Signed)
She recently had a mechanical fall with minor head trauma. CT scan of her head was normal. Based on her description of the fall it sounds like this is due to a problem with new hardware installed on her door and not a medical problem or a problem of instability.  Plan:  -Continue warfarin lifelong - If falls recur then we will need to reconsider her anticoagulation

## 2013-11-16 NOTE — ED Provider Notes (Signed)
Medical screening examination/treatment/procedure(s) were performed by non-physician practitioner and as supervising physician I was immediately available for consultation/collaboration.   EKG Interpretation None        Tanna Furry, MD 11/16/13 [phone]

## 2013-11-26 ENCOUNTER — Ambulatory Visit (INDEPENDENT_AMBULATORY_CARE_PROVIDER_SITE_OTHER): Payer: Medicare Other | Admitting: General Practice

## 2013-11-26 DIAGNOSIS — I2699 Other pulmonary embolism without acute cor pulmonale: Secondary | ICD-10-CM

## 2013-11-26 DIAGNOSIS — Z5181 Encounter for therapeutic drug level monitoring: Secondary | ICD-10-CM

## 2013-11-26 DIAGNOSIS — Z7901 Long term (current) use of anticoagulants: Secondary | ICD-10-CM | POA: Diagnosis not present

## 2013-11-26 LAB — POCT INR: INR: 3.3

## 2013-11-26 NOTE — Progress Notes (Signed)
Pre visit review using our clinic review tool, if applicable. No additional management support is needed unless otherwise documented below in the visit note. 

## 2013-12-18 ENCOUNTER — Encounter: Payer: Self-pay | Admitting: Pulmonary Disease

## 2013-12-28 ENCOUNTER — Other Ambulatory Visit: Payer: Self-pay | Admitting: Emergency Medicine

## 2013-12-28 ENCOUNTER — Ambulatory Visit (HOSPITAL_COMMUNITY)
Admission: RE | Admit: 2013-12-28 | Discharge: 2013-12-28 | Disposition: A | Discharge status: Home / Self Care | Payer: Medicare Other | Source: Ambulatory Visit | Attending: Emergency Medicine | Admitting: Emergency Medicine

## 2013-12-28 ENCOUNTER — Encounter (HOSPITAL_COMMUNITY): Payer: Self-pay

## 2013-12-28 ENCOUNTER — Ambulatory Visit (INDEPENDENT_AMBULATORY_CARE_PROVIDER_SITE_OTHER): Payer: Medicare Other | Admitting: Emergency Medicine

## 2013-12-28 VITALS — BP 116/62 | HR 84 | Temp 97.8°F | Resp 16 | Ht 64.0 in | Wt 157.0 lb

## 2013-12-28 DIAGNOSIS — S0990XA Unspecified injury of head, initial encounter: Secondary | ICD-10-CM | POA: Insufficient documentation

## 2013-12-28 DIAGNOSIS — G319 Degenerative disease of nervous system, unspecified: Secondary | ICD-10-CM | POA: Diagnosis not present

## 2013-12-28 DIAGNOSIS — X58XXXA Exposure to other specified factors, initial encounter: Secondary | ICD-10-CM | POA: Insufficient documentation

## 2013-12-28 DIAGNOSIS — R51 Headache: Secondary | ICD-10-CM | POA: Insufficient documentation

## 2013-12-28 DIAGNOSIS — I2510 Atherosclerotic heart disease of native coronary artery without angina pectoris: Secondary | ICD-10-CM

## 2013-12-28 DIAGNOSIS — Z7901 Long term (current) use of anticoagulants: Secondary | ICD-10-CM

## 2013-12-28 DIAGNOSIS — Z9229 Personal history of other drug therapy: Secondary | ICD-10-CM

## 2013-12-28 NOTE — Patient Instructions (Signed)

## 2013-12-28 NOTE — Progress Notes (Signed)
[facility] [address], [place] [address] [phone]  Date:  12/28/2013   Name:  [name] [name]   DOB:  09/13/1930   MRN:  [id number]  PCP:  Irven Shelling, MD    Chief Complaint: Neck Pain and Back Pain   History of Present Illness:  [name] [name] is a 80 y.o. very pleasant female patient who presents with the following:  Lost her balance and fell in the house and hit her parietal scalp on a corner of a piece of furniture.   No LOC or neuro or visual symptoms.  No weakness or tingling in extremities.  Apparently she bent over to plug in an appliance and lost her balance.  She has experienced a significant headache since Thursday since the incident.  She is on coumadin. Had a prior fall in May and was seen in ED with negative CT head.  No improvement with over the counter medications or other home remedies. Denies other complaint or health concern today.   Patient Active Problem List   Diagnosis Date Noted  . Encounter for therapeutic drug monitoring 08/06/2013  . Unstable angina 06/17/2013  . Long term (current) use of anticoagulants 01/04/2013  . Chest pain 02/27/2012  . CAD (coronary artery disease) 02/27/2012  . Dyspnea 11/07/2011  . Acute sinusitis, unspecified 11/03/2010  . PRURITUS 08/18/2009  . Postinflammatory pulmonary fibrosis 06/30/2008  . CONSTIPATION 01/03/2008  . ABDOMINAL PAIN-LUQ 01/03/2008  . DYSLIPIDEMIA 06/16/2007  . DEEP VENOUS THROMBOPHLEBITIS 06/16/2007  . ALLERGIC RHINITIS 06/16/2007  . PULMONARY EMBOLISM, HX OF 06/16/2007    Past Medical History  Diagnosis Date  . Allergic rhinitis   . History of pulmonary embolism   . DVT (deep venous thrombosis)   . Dyslipidemia   . Pulmonary fibrosis   . DJD (degenerative joint disease)   . Osteoporosis   . CAD (coronary artery disease)     Mild CAD by cath 2008    Past Surgical History  Procedure Laterality Date  . Abdominal hysterectomy    . Appendectomy    . Total  knee arthroplasty  1997    Bilateral  . Tonsillectomy      History  Substance Use Topics  . Smoking status: Former Smoker -- 0.30 packs/day for 20 years    Types: Cigarettes    Quit date: 07/11/1973  . Smokeless tobacco: Never Used  . Alcohol Use: 0.0 oz/week     Comment: approx 1 glass wine per week    Family History  Problem Relation Age of Onset  . Kidney disease Daughter 5  . Cancer Daughter   . Heart attack Father 66  . Alzheimer's disease Sister     Allergies  Allergen Reactions  . Crestor [Rosuvastatin] Other (See Comments)    Muscle pain  . Lactose Intolerance (Gi) Other (See Comments)    Stomach upset  . Penicillins Hives  . Niacin Hives    Medication list has been reviewed and updated.  Current Outpatient Prescriptions on File Prior to Visit  Medication Sig Dispense Refill  . ALPRAZolam (XANAX) 0.5 MG tablet Take 0.25 mg by mouth as needed for anxiety.       . cetirizine (ZYRTEC) 10 MG tablet Take 5-10 mg by mouth daily as needed for allergies.      . fluticasone (FLONASE) 50 MCG/ACT nasal spray Place 2 sprays into both nostrils daily as needed for allergies or rhinitis.      [name] Glycol-Propyl Glycol (SYSTANE) 0.4-0.3 %  SOLN Apply 1 drop to eye daily as needed (for dry eyes).      . Probiotic Product (PROBIOTIC DAILY PO) Take by mouth daily.      [name] warfarin (COUMADIN) 5 MG tablet Take 2.5-5 mg by mouth daily at 6 PM. Take 1 tablet (5mg ) on Tuesday and Friday.  Take 1/2 tablet (2.5mg ) on all other days       Current Facility-Administered Medications on File Prior to Visit  Medication Dose Route Frequency Provider Last Rate Last Dose  . 0.9 %  sodium chloride infusion  250 mL Intravenous PRN Juanito Doom, MD        Review of Systems:  As per HPI, otherwise negative.    Physical Examination: Filed Vitals:   12/28/13 1404  BP: 116/62  Pulse: 84  Temp: 97.8 F (36.6 C)  Resp: 16   Filed Vitals:   12/28/13 1404  Height: 5\' 4"  (1.626 m)   Weight: 157 lb (71.215 kg)   Body mass index is 26.94 kg/(m^2). Ideal Body Weight: Weight in (lb) to have BMI = 25: 145.3  GEN: WDWN, NAD, Non-toxic, A & O x 3 HEENT: Atraumatic, Normocephalic. Neck supple. No masses, No LAD. Ears and Nose: No external deformity. CV: RRR, No M/G/R. No JVD. No thrill. No extra heart sounds. PULM: CTA B, no wheezes, crackles, rhonchi. No retractions. No resp. distress. No accessory muscle use. ABD: S, NT, ND, +BS. No rebound. No HSM. EXTR: No c/c/e NEURO Normal gait. PRRERLA EOMI CN 2-12 intact.  Romberg intact PSYCH: Normally interactive. Conversant. Not depressed or anxious appearing.  Calm demeanor.    Assessment and Plan: Closed head injury  Long term coumadin Negative CT head   Signed,  Ellison Carwin, MD

## 2013-12-31 ENCOUNTER — Ambulatory Visit (INDEPENDENT_AMBULATORY_CARE_PROVIDER_SITE_OTHER): Payer: Medicare Other | Admitting: Family Medicine

## 2013-12-31 DIAGNOSIS — Z5181 Encounter for therapeutic drug level monitoring: Secondary | ICD-10-CM | POA: Diagnosis not present

## 2013-12-31 DIAGNOSIS — I2699 Other pulmonary embolism without acute cor pulmonale: Secondary | ICD-10-CM | POA: Diagnosis not present

## 2013-12-31 DIAGNOSIS — Z7901 Long term (current) use of anticoagulants: Secondary | ICD-10-CM | POA: Diagnosis not present

## 2013-12-31 LAB — POCT INR: INR: 1.6

## 2014-01-07 DIAGNOSIS — W1809XA Striking against other object with subsequent fall, initial encounter: Secondary | ICD-10-CM | POA: Diagnosis not present

## 2014-01-07 DIAGNOSIS — R51 Headache: Secondary | ICD-10-CM | POA: Diagnosis not present

## 2014-01-07 DIAGNOSIS — M542 Cervicalgia: Secondary | ICD-10-CM | POA: Diagnosis not present

## 2014-01-21 ENCOUNTER — Telehealth: Payer: Self-pay | Admitting: Pulmonary Disease

## 2014-01-21 NOTE — Telephone Encounter (Signed)
LMOM x 1 

## 2014-01-21 NOTE — Telephone Encounter (Signed)
Pt returned call.  Holly D Pryor ° °

## 2014-01-21 NOTE — Telephone Encounter (Signed)
Called spoke with pt. She is needing a refill on her coumadin. She was last giving refill by [facility]. Please advise BQ if okay to refill thanks

## 2014-01-22 ENCOUNTER — Telehealth: Payer: Self-pay | Admitting: Obstetrics and Gynecology

## 2014-01-22 MED ORDER — WARFARIN SODIUM 5 MG PO TABS: 2.5000 mg | ORAL_TABLET | Freq: Every day | ORAL | Status: DC

## 2014-01-22 NOTE — Telephone Encounter (Signed)
Ok by me Follow up with [place] should be soon

## 2014-01-22 NOTE — Telephone Encounter (Signed)
Spoke with patient. She states that "For a very long time" she has noticed vaginal odor and notices odor with urination. No fevers, flank pain, dysuria, nausea, vomiting. Tolerating fluids and food well. Met with a friend today who encouraged her to speak with a doctor about this ongoing issue. Increased water and did not have relief of odor. Offered office visit, patient declines today or tomorrow. Requests Friday. Office visit scheduled with Dr. Charlies Constable for 1300. Patient advised to call back if develops any worsening of symptoms or further concerns, she is agreeble.  Routing to provider for final review. Patient agreeable to disposition. Will close encounter

## 2014-01-22 NOTE — Telephone Encounter (Signed)
Pt says she has a very bad vaginal odor. Please call to schedule an appointment.

## 2014-01-22 NOTE — Telephone Encounter (Signed)
BQ please advise if ok to refill Coumadin. Thanks.

## 2014-01-22 NOTE — Telephone Encounter (Signed)
Called in rx. Appt made for 02/20/2014 with BQ. Pt verbalized understanding and confirmed appt time and date. Nothing further needed.

## 2014-01-24 ENCOUNTER — Encounter: Payer: Self-pay | Admitting: Gynecology

## 2014-01-24 ENCOUNTER — Ambulatory Visit (INDEPENDENT_AMBULATORY_CARE_PROVIDER_SITE_OTHER): Payer: Medicare Other | Admitting: Gynecology

## 2014-01-24 VITALS — BP 100/60 | HR 64 | Temp 98.2°F | Resp 20

## 2014-01-24 DIAGNOSIS — N3000 Acute cystitis without hematuria: Secondary | ICD-10-CM | POA: Diagnosis not present

## 2014-01-24 DIAGNOSIS — R0602 Shortness of breath: Secondary | ICD-10-CM

## 2014-01-24 DIAGNOSIS — I2510 Atherosclerotic heart disease of native coronary artery without angina pectoris: Secondary | ICD-10-CM

## 2014-01-24 DIAGNOSIS — R300 Dysuria: Secondary | ICD-10-CM | POA: Diagnosis not present

## 2014-01-24 DIAGNOSIS — R079 Chest pain, unspecified: Secondary | ICD-10-CM | POA: Diagnosis not present

## 2014-01-24 DIAGNOSIS — J8410 Pulmonary fibrosis, unspecified: Secondary | ICD-10-CM

## 2014-01-24 LAB — POCT URINALYSIS DIPSTICK
BILIRUBIN UA: NEGATIVE
GLUCOSE UA: NEGATIVE
KETONES UA: NEGATIVE
Nitrite, UA: NEGATIVE
PH UA: 6
Urobilinogen, UA: NEGATIVE

## 2014-01-24 MED ORDER — PHENAZOPYRIDINE HCL 200 MG PO TABS: 200.0000 mg | ORAL_TABLET | Freq: Three times a day (TID) | ORAL | Status: DC | PRN

## 2014-01-24 MED ORDER — NITROFURANTOIN MONOHYD MACRO 100 MG PO CAPS: 100.0000 mg | ORAL_CAPSULE | Freq: Two times a day (BID) | ORAL | Status: DC

## 2014-01-24 NOTE — Progress Notes (Signed)
Subjective:     Patient ID: [name] [name], female   DOB: Feb 20, 1931, 80 y.o.   MRN: [id number]  HPI Comments: Pt here reporting malodorous urine for several months.  Pt tried to increase her fluids but to no avail.  Mild pain started this am Pt also reports some shortness of breath and pain in her left arm since this am, intense and scared her, it has now resolved.- pt sees Dr Electa Sniff, did not call.  Sees pulmonary at [facility]  Dysuria  Pertinent negatives include no chills.  Chest Pain  Associated symptoms include shortness of breath. Pertinent negatives include no palpitations.     Review of Systems  Constitutional: Negative for chills and fatigue.  Respiratory: Positive for shortness of breath. Negative for wheezing and stridor.   Cardiovascular: Positive for chest pain. Negative for palpitations and leg swelling.  Genitourinary: Positive for dysuria.       Objective:   Physical Exam  Nursing note and vitals reviewed. Constitutional: She is oriented to person, place, and time. She appears well-developed and well-nourished. No distress.  Pulmonary/Chest: Effort normal. No respiratory distress. She has no wheezes. She has rales (dry, bilateral bases). She exhibits no tenderness.  Neurological: She is alert and oriented to person, place, and time.  Skin: Skin is warm and dry. She is not diaphoretic.  Psychiatric: She has a normal mood and affect. Her behavior is normal.       Assessment:     UTI Pulmonary fibrosis SOB with left arm pain     Plan:     Urine culture macrobid and pyridium Spoke to Dr Richard Miu will be seen there today at 3:30

## 2014-01-28 ENCOUNTER — Telehealth: Payer: Self-pay | Admitting: Emergency Medicine

## 2014-01-28 ENCOUNTER — Ambulatory Visit (INDEPENDENT_AMBULATORY_CARE_PROVIDER_SITE_OTHER): Payer: Medicare Other | Admitting: General Practice

## 2014-01-28 DIAGNOSIS — Z7901 Long term (current) use of anticoagulants: Secondary | ICD-10-CM | POA: Diagnosis not present

## 2014-01-28 DIAGNOSIS — Z5181 Encounter for therapeutic drug level monitoring: Secondary | ICD-10-CM

## 2014-01-28 DIAGNOSIS — I2699 Other pulmonary embolism without acute cor pulmonale: Secondary | ICD-10-CM

## 2014-01-28 LAB — URINE CULTURE: Colony Count: 50000

## 2014-01-28 LAB — POCT INR: INR: 2.2

## 2014-01-28 MED ORDER — CIPROFLOXACIN HCL 500 MG PO TABS: 500.0000 mg | ORAL_TABLET | Freq: Two times a day (BID) | ORAL | Status: DC

## 2014-01-28 NOTE — Telephone Encounter (Signed)
Detailed message left on home number, okay per designated party release form.  Requested that patient call back when she gets detailed message to ensure she understands directions and to scheduled follow up test of cure.   Message left to return call to [place] at [phone].

## 2014-01-28 NOTE — Addendum Note (Signed)
Addended by: Elveria Rising on: 01/28/2014 12:27 PM   Modules accepted: Orders

## 2014-01-28 NOTE — Progress Notes (Signed)
Pre visit review using our clinic review tool, if applicable. No additional management support is needed unless otherwise documented below in the visit note. 

## 2014-01-28 NOTE — Telephone Encounter (Signed)
Message copied by Michele Mcalpine on Tue Jan 28, 2014  1:48 PM ------      Message from: Elveria Rising      Created: Tue Jan 28, 2014 12:24 PM       Bacteria in urine is not sensitive to antibiotic given , will call in new antibiotic and have pt return for test of cure ------

## 2014-01-29 ENCOUNTER — Encounter: Payer: Self-pay | Admitting: Cardiovascular Disease

## 2014-01-29 ENCOUNTER — Ambulatory Visit (INDEPENDENT_AMBULATORY_CARE_PROVIDER_SITE_OTHER): Payer: Medicare Other | Admitting: Cardiovascular Disease

## 2014-01-29 VITALS — BP 100/62 | HR 83 | Ht 64.5 in | Wt 154.0 lb

## 2014-01-29 DIAGNOSIS — I2510 Atherosclerotic heart disease of native coronary artery without angina pectoris: Secondary | ICD-10-CM

## 2014-01-29 NOTE — Patient Instructions (Signed)
Your physician wants you to follow-up in:  12 months.  You will receive a reminder letter in the mail two months in advance. If you don't receive a letter, please call our office to schedule the follow-up appointment.   

## 2014-01-29 NOTE — Telephone Encounter (Signed)
Message left to return call to [name] [name] at [phone] on home and mobile to return my call.

## 2014-01-29 NOTE — Progress Notes (Signed)
History of Present Illness: 80 yo WF with history of CAD, DVT/PE, HLD, pulmonary fibrosis who is here today for cardiac follow up. I saw her 02/27/12 as a new patient for evaluation of chest pains and pain between her shoulder blades. Stress test was normal in August 2013. Known to have mild CAD by cath 2008. Admitted December 2014 with chest pain. She underwent cardiac catheterization on December 9,2014 and found to have 30% mid LAD, 30% mid Circumflex, 20% mid RCA. She has not tolerated statins in the past. Echo 03/06/12 with normal LV size and function with no significant valvular abnormalities.   She is here today for f/u. She tells me that she had several episodes of chest pain at rest over the last month. Also chronic pain in back between shoulder blades. No pain over last week. Each episode lasted for 10 minutes. Only at rest. No pain with exertion.   Primary Care Physician: Lavone Orn  Last Lipid Profile:  Lipid Panel     Component Value Date/Time   CHOL 248* 06/18/2013 0500   TRIG 99 06/18/2013 0500   HDL 49 06/18/2013 0500   CHOLHDL 5.1 06/18/2013 0500   VLDL 20 06/18/2013 0500   LDLCALC 179* 06/18/2013 0500     Past Medical History  Diagnosis Date  . Allergic rhinitis   . History of pulmonary embolism   . DVT (deep venous thrombosis)   . Dyslipidemia   . Pulmonary fibrosis   . DJD (degenerative joint disease)   . Osteoporosis   . CAD (coronary artery disease)     Mild CAD by cath 2008    Past Surgical History  Procedure Laterality Date  . Abdominal hysterectomy    . Appendectomy    . Total knee arthroplasty  1997    Bilateral  . Tonsillectomy      Current Outpatient Prescriptions  Medication Sig Dispense Refill  . ALPRAZolam (XANAX) 0.5 MG tablet Take 0.25 mg by mouth as needed for anxiety.       . cetirizine (ZYRTEC) 10 MG tablet Take 5-10 mg by mouth daily as needed for allergies.      . ciprofloxacin (CIPRO) 500 MG tablet Take 1 tablet (500 mg total) by  mouth 2 (two) times daily.  14 tablet  0  . fluticasone (FLONASE) 50 MCG/ACT nasal spray Place 2 sprays into both nostrils daily as needed for allergies or rhinitis.      . phenazopyridine (PYRIDIUM) 200 MG tablet Take 1 tablet (200 mg total) by mouth 3 (three) times daily as needed for pain.  10 tablet  0  . Polyethyl Glycol-Propyl Glycol (SYSTANE) 0.4-0.3 % SOLN Apply 1 drop to eye daily as needed (for dry eyes).      . Probiotic Product (PROBIOTIC DAILY PO) Take by mouth daily.      [name] warfarin (COUMADIN) 5 MG tablet Take 0.5-1 tablets (2.5-5 mg total) by mouth daily at 6 PM. Take 1 tablet (5mg ) on Tuesday and Friday.  Take 1/2 tablet (2.5mg ) on all other days  30 tablet  3   No current facility-administered medications for this visit.   Facility-Administered Medications Ordered in Other Visits  Medication Dose Route Frequency Provider Last Rate Last Dose  . 0.9 %  sodium chloride infusion  250 mL Intravenous PRN Juanito Doom, MD        Allergies  Allergen Reactions  . Crestor [Rosuvastatin] Other (See Comments)    Muscle pain  . Lactose Intolerance (Gi) Other (See  Comments)    Stomach upset  . Penicillins Hives  . Niacin Hives    History   Social History  . Marital Status: Widowed    Spouse Name: N/A    Number of Children: 2  . Years of Education: N/A   Occupational History  . government Designer, television/film set     retired   Social History Main Topics  . Smoking status: Former Smoker -- 0.30 packs/day for 20 years    Types: Cigarettes    Quit date: 07/11/1973  . Smokeless tobacco: Never Used  . Alcohol Use: 0.0 oz/week     Comment: approx 1 glass wine per week  . Drug Use: No  . Sexual Activity: No   Other Topics Concern  . Not on file   Social History Narrative   Widowed.   2 children: 1 boy and 1 girl   Alcohol use- yes 2/month   Daily caffeine use ---3/week   Illicit drug use---no    Family History  Problem Relation Age of Onset  . Kidney disease  Daughter 5  . Cancer Daughter   . Heart attack Father 57  . Alzheimer's disease Sister     Review of Systems:  As stated in the HPI and otherwise negative.   BP 100/62  Pulse 83  Ht 5' 4.5" (1.638 m)  Wt 154 lb (69.854 kg)  BMI 26.04 kg/m2  Physical Examination: General: Well developed, well nourished, NAD HEENT: OP clear, mucus membranes moist SKIN: warm, dry. No rashes. Neuro: No focal deficits Musculoskeletal: Muscle strength 5/5 all ext Psychiatric: Mood and affect normal Neck: No JVD, no carotid bruits, no thyromegaly, no lymphadenopathy. Lungs:Clear bilaterally, no wheezes, rhonci, crackles Cardiovascular: Regular rate and rhythm. No murmurs, gallops or rubs. Abdomen:Soft. Bowel sounds present. Non-tender.  Extremities: No lower extremity edema. Pulses are 2 + in the bilateral DP/PT.  EKG: NSR, rate 83 bpm.   Exercise Stress Myoview: 03/05/12:  Stress Procedure: The patient performed treadmill exercise using a Bruce Protocol for minutes. The patient stopped due to SOB and denied any chest pain. There were no significant ST-T wave changes. Technetium 38m Tetrofosmin was injected at peak exercise and myocardial perfusion imaging was performed after a brief delay.  Stress ECG: No significant change from baseline ECG  QPS  Raw Data Images: Images were motion corrected. Soft tissue (diaphragm, breast) surround heart.  Stress Images: Normal homogeneous uptake in all areas of the myocardium.  Rest Images: Normal homogeneous uptake in all areas of the myocardium.  Subtraction (SDS): No evidence of ischemia.  Transient Ischemic Dilatation (Normal <1.22): *0.76**  Lung/Heart Ratio (Normal <0.45): 0.42  Quantitative Gated Spect Images  QGS EDV: 42 ml  QGS ESV: 6 ml  Impression  Exercise Capacity: Poor exercise capacity.  BP Response: Normal blood pressure response.  Clinical Symptoms: No chest pain.  ECG Impression: No significant ST segment change suggestive of ischemia.    Comparison with Prior Nuclear Study: No images to compare  Overall Impression: Normal stress nuclear study.  Echo 03/06/12:  Left ventricle: The cavity size was normal. Wall thickness was normal. Systolic function was normal. The estimated ejection fraction was in the range of 55% to 60%. Wall motion was normal; there were no regional wall motion abnormalities. Doppler parameters are consistent with abnormal left ventricular relaxation (grade 1 diastolic dysfunction). - Aortic valve: There was no stenosis. - Mitral valve: No significant regurgitation. - Right ventricle: The cavity size was normal. Systolic function was mildly reduced. - Pulmonary arteries:  PA peak pressure: 64mm Hg (S). - Inferior vena cava: The vessel was normal in size; the respirophasic diameter changes were in the normal range (= 50%); findings are consistent with normal central venous pressure. Impressions:  - Normal LV size and systolic function, EF 03-83%. Normal RV size with mildly decreased systolic function. Doppler interrogation of TR jet does not suggest pulmonary hypertension. No significant valvular abnormalities.  Cardiac cath 06/18/13: Left mainstem: Normal  Left anterior descending (LAD): Mild calcification. 30% disease in the mid vessel at the take off of the first diagonal.  Left circumflex (LCx): 30% disease in the mid vessel.  Right coronary artery (RCA): Mild calcification. Mild disease in the proximal and mid vessel to 20%.  Left ventriculography: Left ventricular systolic function is normal, LVEF is estimated at 55-65%, there is no significant mitral regurgitation   Assessment and Plan:   1. CAD: She is known to have mild CAD by cath December 2014. She has not been on a beta blocker or statin. She is intolerant to statins. She will continue taking ASA 81 mg po Qdaily. She does not wish to start a beta blocker. BP is within normal range.

## 2014-01-29 NOTE — Telephone Encounter (Signed)
Patient returned call. She has received message from yesterday and has taken two doses of Cipro. Denies complaints. Urine test of cure nurse appointment scheduled for 02/11/14 at 2:30. Routing to provider for final review. Patient agreeable to disposition. Will close encounter

## 2014-02-04 DIAGNOSIS — H40059 Ocular hypertension, unspecified eye: Secondary | ICD-10-CM | POA: Diagnosis not present

## 2014-02-04 DIAGNOSIS — H2510 Age-related nuclear cataract, unspecified eye: Secondary | ICD-10-CM | POA: Diagnosis not present

## 2014-02-11 ENCOUNTER — Ambulatory Visit (INDEPENDENT_AMBULATORY_CARE_PROVIDER_SITE_OTHER): Payer: Medicare Other | Admitting: Gynecology

## 2014-02-11 ENCOUNTER — Encounter: Payer: Self-pay | Admitting: Gynecology

## 2014-02-11 VITALS — BP 114/64 | HR 60 | Ht 64.5 in | Wt 157.0 lb

## 2014-02-11 DIAGNOSIS — I2510 Atherosclerotic heart disease of native coronary artery without angina pectoris: Secondary | ICD-10-CM

## 2014-02-11 DIAGNOSIS — N760 Acute vaginitis: Secondary | ICD-10-CM | POA: Diagnosis not present

## 2014-02-11 DIAGNOSIS — N762 Acute vulvitis: Secondary | ICD-10-CM

## 2014-02-11 DIAGNOSIS — R300 Dysuria: Secondary | ICD-10-CM | POA: Diagnosis not present

## 2014-02-11 LAB — POCT WET PREP (WET MOUNT): Clue Cells Wet Prep Whiff POC: NEGATIVE

## 2014-02-11 NOTE — Progress Notes (Signed)
Pt came in for a nurse visit to give a urine TOC Pt states no sx and has finished antibiotics. Pt stated that she thinks she has a yeast infection from the antibiotics.   Culture sent to lab

## 2014-02-11 NOTE — Patient Instructions (Signed)
Use incontinence pads instead of always menstrual pad Protect skin with light coat of A&D or cocoanut oil or vit E Blot don't wipe after urinating

## 2014-02-11 NOTE — Progress Notes (Signed)
Subjective:     Patient ID: [name] [name], female   DOB: [birth year], 80 y.o.   MRN: [id number]  HPI Comments: Pt here for test of cure after +UTI, reports possible yeast infection, symptoms mostly external vulva.  Pt states she wears the always pads on a regular basis due to issues with incontinence.   When here last, pt had SOB and was dx with GERD.  Is feeling much better. Pt also reports recent falls (2), with head trauma, she is on coumadin for +DVT.  Pt states she feels forgetful and foggy at times since the second fall?  She was unable to find our office last visit and this concerns here. She has an appt with PCP next week    Review of Systems Per HPI    Objective:   Physical Exam  Nursing note and vitals reviewed. Constitutional: She is oriented to person, place, and time. She appears well-developed and well-nourished.  Genitourinary:   Pelvic: External genitalia:  no lesions, postmenopausal changes              Urethra:  normal appearing urethra with no masses, tenderness or lesions              Bartholins and Skenes: normal                 Vagina: no lesions, atrophic, pale thin, no tenderness under bladder neck              Cervix: absent      Neurological: She is alert and oriented to person, place, and time.   WP done     Assessment:     Vulvitis TOC UTI      Plan:     Contact irritation from pads?  Use incontinence pads instead of always menstrual pad Protect skin with light coat of A&D or cocoanut oil or vit E Urine culture Forgetfulness will f/u with PCP next week

## 2014-02-12 LAB — URINE CULTURE
Colony Count: NO GROWTH
Organism ID, Bacteria: NO GROWTH

## 2014-02-20 ENCOUNTER — Ambulatory Visit (INDEPENDENT_AMBULATORY_CARE_PROVIDER_SITE_OTHER): Payer: Medicare Other | Admitting: Pulmonary Disease

## 2014-02-20 ENCOUNTER — Encounter: Payer: Self-pay | Admitting: Pulmonary Disease

## 2014-02-20 VITALS — BP 110/70 | HR 91 | Ht 64.0 in | Wt 155.8 lb

## 2014-02-20 DIAGNOSIS — R0600 Dyspnea, unspecified: Secondary | ICD-10-CM

## 2014-02-20 DIAGNOSIS — J8410 Pulmonary fibrosis, unspecified: Secondary | ICD-10-CM | POA: Diagnosis not present

## 2014-02-20 DIAGNOSIS — R0989 Other specified symptoms and signs involving the circulatory and respiratory systems: Secondary | ICD-10-CM

## 2014-02-20 DIAGNOSIS — I82409 Acute embolism and thrombosis of unspecified deep veins of unspecified lower extremity: Secondary | ICD-10-CM | POA: Diagnosis not present

## 2014-02-20 DIAGNOSIS — I2510 Atherosclerotic heart disease of native coronary artery without angina pectoris: Secondary | ICD-10-CM | POA: Diagnosis not present

## 2014-02-20 DIAGNOSIS — R0609 Other forms of dyspnea: Secondary | ICD-10-CM

## 2014-02-20 NOTE — Assessment & Plan Note (Signed)
She has had multiple recurrent pulmonary emboli, but currently because she has had two falls in 6 months I am uncomfortable continuing warfarin.  Obviously this raises her risk for DVT.  I don't think a long term IVC filter is a good option either because it carries a thrombogenic risk as well.    Today we discussed the risks and benefits and she has elected to   Plan: -stop warfarin -given Rx for lovenox 30mg  to use sub-cutaneously on day of international travel

## 2014-02-20 NOTE — Patient Instructions (Signed)
Stop taking warfarin If and when you travel, take the injection of lovenox on the day of travel only.  You only need one shot for an entire day.  Don't take this unless you are travelling more than 5 hours. We will order a 6 minute walk today and a pulmonary function test before the next visit in 4 months

## 2014-02-20 NOTE — Progress Notes (Signed)
Patient ID: [name] [name], female   DOB: Apr 16, 1931, 80 y.o.   MRN: [id number] Six minute walk done today

## 2014-02-20 NOTE — Assessment & Plan Note (Signed)
Stable interval as far as symptoms go.  Plan: -continue observation alone -6MW today -PFT on next visit

## 2014-02-20 NOTE — Progress Notes (Signed)
Subjective:    Patient ID: [name] [name], female    DOB: 10-16-1930, 80 y.o.   MRN: [id number]  Synopsis: This is a very pleasant female who is diagnosed with interstitial lung disease in her mid 31s after evaluation at [facility] and [facility]. She was found to have a UIP pattern on CT scan and serology testing revealed a very high rheumatoid factor as well as anti-CCP antibody. She was followed by Dr. Elgie Collard at [facility] who felt like she had rheumatoid arthritis associated interstitial lung disease. He never treated her with immunosuppressive therapy. From 2000 07/09/2013 she's continued to follow in both [place] and due to her lung function has remained stable during that time. She has not developed new shortness of breath. She has had repeated episodes of pulmonary emboli. The most recent was in the summer of 2014 when she developed her fourth pulmonary embolism after returning from a trip to [place].   HPI   02/20/2014 ROV > [name] [name] has been doing OK lately.  She recently ran out of her warfarin.  She recently was diagnosed with acid reflux and she recently started on a new prescription antacid without difficulty.  She only has problems breathing when she overly exerts herself.  Occassional dyspnea, minimal leg swelling.  Rare cough, usually dry.  She has not had bleeding episodes since being on wafarin.   She recently had a fall when she struck her head on a metal cabinet.  She had to go to [facility] ED for a CT head.  Past Medical History  Diagnosis Date  . Allergic rhinitis   . History of pulmonary embolism   . DVT (deep venous thrombosis)   . Dyslipidemia   . Pulmonary fibrosis   . DJD (degenerative joint disease)   . Osteoporosis   . CAD (coronary artery disease)     Mild CAD by cath 2008       Review of Systems  Constitutional: Negative for fever, chills and fatigue.  HENT: Negative for congestion, postnasal drip and rhinorrhea.    Respiratory: Negative for cough, shortness of breath and wheezing.   Cardiovascular: Negative for chest pain, palpitations and leg swelling.       Objective:   Physical Exam   Filed Vitals:   02/20/14 1406  BP: 110/70  Pulse: 91  Height: 5\' 4"  (1.626 m)  Weight: 155 lb 12.8 oz (70.67 kg)  SpO2: 97%  RA  Ambulated 500 feet on RA (forehead probe) and O2 saturation dropped no lower than 90%   Gen: well appearing, no acute distress HEENT: NCAT, PERRL, EOMi,  PULM: Crackles in bases CV: RRR, no mgr, no JVD AB: BS+, soft, nontender, no hsm Ext: warm, trace bilateral edema, no clubbing, no cyanosis       Assessment & Plan:   DEEP VENOUS THROMBOPHLEBITIS She has had multiple recurrent pulmonary emboli, but currently because she has had two falls in 6 months I am uncomfortable continuing warfarin.  Obviously this raises her risk for DVT.  I don't think a long term IVC filter is a good option either because it carries a thrombogenic risk as well.    Today we discussed the risks and benefits and she has elected to   Plan: -stop warfarin -given Rx for lovenox 30mg  to use sub-cutaneously on day of international travel  Postinflammatory pulmonary fibrosis Stable interval as far as symptoms go.  Plan: -continue observation alone -6MW today -PFT on next visit  Updated Medication List Outpatient Encounter Prescriptions as of 02/20/2014  Medication Sig  . ALPRAZolam (XANAX) 0.5 MG tablet Take 0.25 mg by mouth as needed for anxiety.   . cetirizine (ZYRTEC) 10 MG tablet Take 5-10 mg by mouth daily as needed for allergies.  . fluticasone (FLONASE) 50 MCG/ACT nasal spray Place 2 sprays into both nostrils daily as needed for allergies or rhinitis.  . phenazopyridine (PYRIDIUM) 200 MG tablet Take 1 tablet (200 mg total) by mouth 3 (three) times daily as needed for pain.  [name] [name] Glycol-Propyl Glycol (SYSTANE) 0.4-0.3 % SOLN Apply 1 drop to eye daily as needed (for dry eyes).   . Probiotic Product (PROBIOTIC DAILY PO) Take by mouth daily.  [name] warfarin (COUMADIN) 5 MG tablet Take 0.5-1 tablets (2.5-5 mg total) by mouth daily at 6 PM. Take 1 tablet (5mg ) on Tuesday and Friday.  Take 1/2 tablet (2.5mg ) on all other days

## 2014-02-25 ENCOUNTER — Ambulatory Visit: Payer: Self-pay | Admitting: Family Medicine

## 2014-02-25 DIAGNOSIS — Z5181 Encounter for therapeutic drug level monitoring: Secondary | ICD-10-CM

## 2014-02-28 DIAGNOSIS — R079 Chest pain, unspecified: Secondary | ICD-10-CM | POA: Diagnosis not present

## 2014-03-03 ENCOUNTER — Telehealth: Payer: Self-pay | Admitting: Pulmonary Disease

## 2014-03-04 MED ORDER — WARFARIN SODIUM 5 MG PO TABS
2.5000 mg | ORAL_TABLET | Freq: Every day | ORAL | Status: DC
Start: 2014-03-04 — End: 2014-05-21

## 2014-03-04 NOTE — Telephone Encounter (Signed)
[facility]. [place] Bing, CMA

## 2014-04-09 ENCOUNTER — Ambulatory Visit (INDEPENDENT_AMBULATORY_CARE_PROVIDER_SITE_OTHER): Payer: Medicare Other | Admitting: Family Medicine

## 2014-04-09 VITALS — BP 105/62 | HR 87 | Temp 97.6°F | Resp 16 | Ht 63.5 in | Wt 156.0 lb

## 2014-04-09 DIAGNOSIS — J029 Acute pharyngitis, unspecified: Secondary | ICD-10-CM

## 2014-04-09 DIAGNOSIS — J8410 Pulmonary fibrosis, unspecified: Secondary | ICD-10-CM | POA: Diagnosis not present

## 2014-04-09 DIAGNOSIS — I2510 Atherosclerotic heart disease of native coronary artery without angina pectoris: Secondary | ICD-10-CM | POA: Diagnosis not present

## 2014-04-09 LAB — POCT RAPID STREP A (OFFICE): Rapid Strep A Screen: NEGATIVE

## 2014-04-09 NOTE — Patient Instructions (Signed)
Pharyngitis Pharyngitis is redness, pain, and swelling (inflammation) of your pharynx.  CAUSES  Pharyngitis is usually caused by infection. Most of the time, these infections are from viruses (viral) and are part of a cold. However, sometimes pharyngitis is caused by bacteria (bacterial). Pharyngitis can also be caused by allergies. Viral pharyngitis may be spread from person to person by coughing, sneezing, and personal items or utensils (cups, forks, spoons, toothbrushes). Bacterial pharyngitis may be spread from person to person by more intimate contact, such as kissing.  SIGNS AND SYMPTOMS  Symptoms of pharyngitis include:   Sore throat.   Tiredness (fatigue).   Low-grade fever.   Headache.  Joint pain and muscle aches.  Skin rashes.  Swollen lymph nodes.  Plaque-like film on throat or tonsils (often seen with bacterial pharyngitis). DIAGNOSIS  Your health care provider will ask you questions about your illness and your symptoms. Your medical history, along with a physical exam, is often all that is needed to diagnose pharyngitis. Sometimes, a rapid strep test is done. Other lab tests may also be done, depending on the suspected cause.  TREATMENT  Viral pharyngitis will usually get better in 3-4 days without the use of medicine. Bacterial pharyngitis is treated with medicines that kill germs (antibiotics).  HOME CARE INSTRUCTIONS   Drink enough water and fluids to keep your urine clear or pale yellow.   Only take over-the-counter or prescription medicines as directed by your health care provider:   If you are prescribed antibiotics, make sure you finish them even if you start to feel better.   Do not take aspirin.   Get lots of rest.   Gargle with 8 oz of salt water ( tsp of salt per 1 qt of water) as often as every 1-2 hours to soothe your throat.   Throat lozenges (if you are not at risk for choking) or sprays may be used to soothe your throat. SEEK MEDICAL  CARE IF:   You have large, tender lumps in your neck.  You have a rash.  You cough up green, yellow-brown, or bloody spit. SEEK IMMEDIATE MEDICAL CARE IF:   Your neck becomes stiff.  You drool or are unable to swallow liquids.  You vomit or are unable to keep medicines or liquids down.  You have severe pain that does not go away with the use of recommended medicines.  You have trouble breathing (not caused by a stuffy nose). MAKE SURE YOU:   Understand these instructions.  Will watch your condition.  Will get help right away if you are not doing well or get worse. Document Released: 06/27/2005 Document Revised: 04/17/2013 Document Reviewed: 03/04/2013 [facility] Patient Information 2015 [place], [place]. This information is not intended to replace advice given to you by your health care provider. Make sure you discuss any questions you have with your health care provider.     Treat symptomatically. If you're getting worse please contact me. I will let you know if the strep culture shows anything. Take Tylenol or ibuprofen Use some lozenges as necessary

## 2014-04-09 NOTE — Progress Notes (Signed)
Subjective: 80 year old lady who is here with a history of or 4 days of having had a sore throat. This morning it was terrible. She has not been running any fever. She has a history of pulmonary fibrosis and a little chronic cough. She does not know of any exposure to anyone with a sore throat.  Objective: TMs normal. Throat mildly erythematous. Hard to get a good exam because of her gag reflex. Neck supple without significant nodes. Chest fibrotic crackling bilaterally with very mild wheeze. Heart regular.  Results for orders placed in visit on 04/09/14  POCT RAPID STREP A (OFFICE)      Result Value Ref Range   Rapid Strep A Screen Negative  Negative

## 2014-04-11 LAB — CULTURE, GROUP A STREP: Organism ID, Bacteria: NORMAL

## 2014-04-21 ENCOUNTER — Ambulatory Visit (INDEPENDENT_AMBULATORY_CARE_PROVIDER_SITE_OTHER): Payer: Medicare Other | Admitting: Internal Medicine

## 2014-04-21 VITALS — BP 114/71 | HR 100 | Temp 97.4°F | Resp 22 | Ht 63.0 in | Wt 156.0 lb

## 2014-04-21 DIAGNOSIS — R079 Chest pain, unspecified: Secondary | ICD-10-CM | POA: Diagnosis not present

## 2014-04-21 DIAGNOSIS — T887XXA Unspecified adverse effect of drug or medicament, initial encounter: Secondary | ICD-10-CM | POA: Diagnosis not present

## 2014-04-21 DIAGNOSIS — T50905A Adverse effect of unspecified drugs, medicaments and biological substances, initial encounter: Secondary | ICD-10-CM

## 2014-04-21 DIAGNOSIS — I2510 Atherosclerotic heart disease of native coronary artery without angina pectoris: Secondary | ICD-10-CM | POA: Diagnosis not present

## 2014-04-21 NOTE — Progress Notes (Signed)
   Subjective:    Patient ID: [name] [name], female    DOB: 27-Mar-1931, 80 y.o.   MRN: [id number]  HPI 80 year old woman with cc of "i think I had a reaction to omeprazole".  Acute episode 30 minutes after taking the medication she had acute onset of shortness of breath, headache, dizziness and nausea, also assoc with some tightness in her chest. she usually takes a lower dose of the medication bu t this time her dr gave her 20 mg instead of 10 mg.she drank a glass a h2o and took her bp which was 133/74 which was high for her and then she decided to come to be evaluated in the office. She rates the symptoms as 8/10 initially but now she is completely symptoms free and ahe want so go home without further evaluation . She does not have any chest pain or sob at all she is not dizzy.     Review of Systems  Constitutional: Negative.   HENT: Negative.   Eyes: Negative.   Respiratory: Negative.   Cardiovascular: Negative.   Gastrointestinal: Negative.   Endocrine: Negative.   Genitourinary: Negative.   Musculoskeletal: Negative.   Skin: Negative.   Allergic/Immunologic: Negative.   Neurological: Negative.   Hematological: Negative.   Psychiatric/Behavioral: Negative.   All other systems reviewed and are negative.      Objective:   Physical Exam  Nursing note and vitals reviewed. Constitutional: She is oriented to person, place, and time. She appears well-developed and well-nourished.  HENT:  Head: Normocephalic and atraumatic.  Nose: Nose normal.  Mouth/Throat: Oropharynx is clear and moist.  Eyes: Conjunctivae and EOM are normal. Pupils are equal, round, and reactive to light.  Neck: Normal range of motion. Neck supple.  Cardiovascular: Normal rate, regular rhythm and normal heart sounds.   Pulmonary/Chest: Effort normal and breath sounds normal.  Sat 100% on room air  Abdominal: Soft. Bowel sounds are normal.  Musculoskeletal: Normal range of motion.  Neurological: She is alert  and oriented to person, place, and time.  Skin: Skin is warm and dry.  Psychiatric: She has a normal mood and affect. Her behavior is normal. Judgment and thought content normal.    ekg nsr hr 69 low voltage in precordial leads. Rad. Nonspecific st t changes.      Assessment & Plan:  80 year old female with reaction to omeprazole now resolved. Instructed to avoid further dosing with omeprazole.

## 2014-04-21 NOTE — Patient Instructions (Signed)
Do not take teh omeprazole again. I fyou develop any problems return to he office.

## 2014-04-29 ENCOUNTER — Ambulatory Visit (INDEPENDENT_AMBULATORY_CARE_PROVIDER_SITE_OTHER): Payer: Medicare Other | Admitting: Pulmonary Disease

## 2014-04-29 ENCOUNTER — Ambulatory Visit (INDEPENDENT_AMBULATORY_CARE_PROVIDER_SITE_OTHER): Payer: Medicare Other

## 2014-04-29 DIAGNOSIS — R0600 Dyspnea, unspecified: Secondary | ICD-10-CM | POA: Diagnosis not present

## 2014-04-29 DIAGNOSIS — J8410 Pulmonary fibrosis, unspecified: Secondary | ICD-10-CM | POA: Diagnosis not present

## 2014-04-29 DIAGNOSIS — Z23 Encounter for immunization: Secondary | ICD-10-CM

## 2014-04-29 LAB — PULMONARY FUNCTION TEST
DL/VA % pred: 65 %
DL/VA: 3.23 ml/min/mmHg/L
DLCO UNC % PRED: 35 %
DLCO UNC: 9.16 ml/min/mmHg
FEF 25-75 Post: 1.44 L/sec
FEF 25-75 Pre: 1.38 L/sec
FEF2575-%Change-Post: 4 %
FEF2575-%PRED-POST: 103 %
FEF2575-%Pred-Pre: 99 %
FEV1-%CHANGE-POST: 0 %
FEV1-%Pred-Post: 77 %
FEV1-%Pred-Pre: 78 %
FEV1-POST: 1.54 L
FEV1-Pre: 1.55 L
FEV1FVC-%Change-Post: 7 %
FEV1FVC-%PRED-PRE: 109 %
FEV6-%Change-Post: -7 %
FEV6-%PRED-PRE: 76 %
FEV6-%Pred-Post: 71 %
FEV6-POST: 1.79 L
FEV6-Pre: 1.93 L
FEV6FVC-%CHANGE-POST: 0 %
FEV6FVC-%PRED-PRE: 106 %
FEV6FVC-%Pred-Post: 106 %
FVC-%CHANGE-POST: -7 %
FVC-%Pred-Post: 67 %
FVC-%Pred-Pre: 72 %
FVC-PRE: 1.93 L
FVC-Post: 1.79 L
PRE FEV1/FVC RATIO: 80 %
Post FEV1/FVC ratio: 86 %
Post FEV6/FVC ratio: 100 %
Pre FEV6/FVC Ratio: 100 %
RV % PRED: 56 %
RV: 1.39 L
TLC % pred: 62 %
TLC: 3.27 L

## 2014-04-29 NOTE — Progress Notes (Signed)
PFT done today. 

## 2014-05-09 ENCOUNTER — Encounter: Payer: Self-pay | Admitting: Pulmonary Disease

## 2014-05-21 ENCOUNTER — Ambulatory Visit (INDEPENDENT_AMBULATORY_CARE_PROVIDER_SITE_OTHER): Payer: Medicare Other | Admitting: Internal Medicine

## 2014-05-21 ENCOUNTER — Telehealth: Payer: Self-pay | Admitting: Pulmonary Disease

## 2014-05-21 ENCOUNTER — Encounter: Payer: Self-pay | Admitting: Internal Medicine

## 2014-05-21 ENCOUNTER — Ambulatory Visit (INDEPENDENT_AMBULATORY_CARE_PROVIDER_SITE_OTHER)
Admission: RE | Admit: 2014-05-21 | Discharge: 2014-05-21 | Disposition: A | Discharge status: Home / Self Care | Payer: Medicare Other | Source: Ambulatory Visit | Attending: Internal Medicine | Admitting: Internal Medicine

## 2014-05-21 VITALS — BP 120/80 | HR 80 | Temp 97.7°F | Ht 64.5 in | Wt 159.0 lb

## 2014-05-21 DIAGNOSIS — J8410 Pulmonary fibrosis, unspecified: Secondary | ICD-10-CM

## 2014-05-21 DIAGNOSIS — I2510 Atherosclerotic heart disease of native coronary artery without angina pectoris: Secondary | ICD-10-CM | POA: Diagnosis not present

## 2014-05-21 DIAGNOSIS — R05 Cough: Secondary | ICD-10-CM | POA: Diagnosis not present

## 2014-05-21 DIAGNOSIS — J069 Acute upper respiratory infection, unspecified: Secondary | ICD-10-CM | POA: Diagnosis not present

## 2014-05-21 DIAGNOSIS — R079 Chest pain, unspecified: Secondary | ICD-10-CM

## 2014-05-21 DIAGNOSIS — R0989 Other specified symptoms and signs involving the circulatory and respiratory systems: Secondary | ICD-10-CM | POA: Diagnosis not present

## 2014-05-21 MED ORDER — AZITHROMYCIN 250 MG PO TABS: ORAL_TABLET | ORAL | Status: DC

## 2014-05-21 MED ORDER — PREDNISONE 10 MG PO TABS: ORAL_TABLET | ORAL | Status: DC

## 2014-05-21 NOTE — Progress Notes (Signed)
Subjective:    Patient ID: [name] [name], female    DOB: 1930/09/13,    MRN: [id number]  Synopsis: This is a very pleasant female who is diagnosed with interstitial lung disease in her mid 11s after evaluation at [facility] and [facility]. She was found to have a UIP pattern on CT scan and serology testing revealed a very high rheumatoid factor as well as anti-CCP antibody. She was followed by Dr. Elgie Collard at [facility] who felt like she had rheumatoid arthritis associated interstitial lung disease. He never treated her with immunosuppressive therapy. From 2000 07/09/2013 she's continued to follow in both [place] and due to her lung function has remained stable during that time. She has not developed new shortness of breath. She has had repeated episodes of pulmonary emboli. The most recent was in the summer of 2014 when she developed her fourth pulmonary embolism after returning from a trip to [place].   HPI   02/20/2014 ROV > Teonna has been doing OK lately.  She recently ran out of her warfarin.  She recently was diagnosed with acid reflux and she recently started on a new prescription antacid without difficulty.  She only has problems breathing when she overly exerts herself.  Occassional dyspnea, minimal leg swelling.  Rare cough, usually dry.  She has not had bleeding episodes since being on wafarin.  She recently had a fall when she struck her head on a metal cabinet.  She had to go to [facility] ED for a CT head. rec Stop taking warfarin If and when you travel, take the injection of lovenox on the day of travel only.  You only need one shot for an entire day.  Don't take this unless you are travelling more than 5 hours.    05/21/2014 acute ov/Wert re:  Chief Complaint  Patient presents with  . Acute Visit    Pt c/o increased cough and hoarseness for the past 2 wks- worse x 2-3 days. She also c/o increased SOB x 2 days. Had sharp CP last night- lasted  a while, took acid prilosec otc and that helped.   sputum has turned grey. Cp to ant well localized just to the left of sternum p coughing but lasted only  one hour not pleuritic, sharp stabbing > went to bed and it resolved.  Assoc with sore throat and using lots of lozenges since cough x 2 weeks  No obvious day to day or daytime variabilty or assoc   chest tightness, subjective wheeze overt sinus or hb symptoms. No unusual exp hx or h/o childhood pna/ asthma or knowledge of premature birth.  Sleeping ok without nocturnal  or early am exacerbation  of respiratory  c/o's or need for noct saba. Also denies any obvious fluctuation of symptoms with weather or environmental changes or other aggravating or alleviating factors except as outlined above   Current Medications, Allergies, Complete Past Medical History, Past Surgical History, Family History, and Social History were reviewed in [employer] record.  ROS  The following are not active complaints unless bolded sore throat, dysphagia, dental problems, itching, sneezing,  nasal congestion or excess/ purulent secretions, ear ache,   fever, chills, sweats, unintended wt loss, pleuritic or exertional cp, hemoptysis,  orthopnea pnd or leg swelling, presyncope, palpitations, heartburn, abdominal pain, anorexia, nausea, vomiting, diarrhea  or change in bowel or urinary habits, change in stools or urine, dysuria,hematuria,  rash, arthralgias, visual complaints, headache, numbness weakness or  ataxia or problems with walking or coordination,  change in mood/affect or memory.          Objective:   Physical Exam    amb wf nad  Wt Readings from Last 3 Encounters:  05/21/14 159 lb (72.122 kg)  04/21/14 156 lb (70.761 kg)  04/09/14 156 lb (70.761 kg)    Vital signs reviewed including sats 97% on arrival    Gen: well appearing, no acute distress HEENT: NCAT, PERRL, EOMi,  PULM: Crackles in bases bilaterally  CV: RRR, no mgr, no  JVD AB: BS+, soft, nontender, no hsm Ext: warm, trace bilateral edema, no clubbing, no cyanosis   CXR  05/21/2014 : Stable pattern of pulmonary fibrosis. No evidence for acute superimposed disease.      Assessment & Plan:

## 2014-05-21 NOTE — Patient Instructions (Addendum)
prilosec 20 mg Take 30- 60 min before your first and last meals of the day   GERD (REFLUX)  is an extremely common cause of respiratory symptoms just like yours , many times with no obvious heartburn at all.    It can be treated with medication, but also with lifestyle changes including avoidance of late meals, excessive alcohol, smoking cessation, and avoid fatty foods, chocolate, peppermint, colas, red wine, and acidic juices such as orange juice.  NO MINT OR MENTHOL PRODUCTS SO NO COUGH DROPS  USE SUGARLESS CANDY INSTEAD (Jolley ranchers or Stover's or Life Savers) or even ice chips will also do - the key is to swallow to prevent all throat clearing. NO OIL BASED VITAMINS - use powdered substitutes.  zpak Prednisone 10 mg take  4 each am x 2 days,   2 each am x 2 days,  1 each am x 2 days and stop   For cough > delsym 2 tsp twice daily as needed   Please remember to go to the   x-ray department downstairs for your tests - we will call you with the results when they are available.

## 2014-05-21 NOTE — Telephone Encounter (Signed)
Spoke with patient-having increased productive cough-yellow in color, SOB and has noticed her finger tips getting blue at times. Pt is not in any acute distress when I spoke with her on the phone. Pt will come in today to see MW for acute visit at 4:30pm. Nothing more needed at this time.

## 2014-05-22 NOTE — Assessment & Plan Note (Signed)
In setting of cough x 2 weeks probably related to uri, well localized near the midline anteriorly s pleuritic or ex features and resolved supine p prilosec so cougl have been related to GERD from coughing or intercostal spasm from coughing but not an ongoing problem and certainly not c/w PE though she is at risk.

## 2014-05-22 NOTE — Assessment & Plan Note (Signed)
Explained the natural history of uri and why it's necessary in patients at risk to treat GERD aggressively - at least  short term -   to reduce risk of evolving cyclical cough initially  triggered by epithelial injury and a heightened sensitivty to the effects of any upper airway irritants,  most importantly acid - related - then perpetuated by epithelial injury related to the cough itself as the upper airway collapses on itself.  That is, the more sensitive the epithelium becomes once it is damaged by the virus, the more the ensuing irritability> the more the cough, the more the secondary reflux (especially in those prone to reflux) the more the irritation of the sensitive mucosa and so on in a  Classic cyclical pattern.    For now rx with zpak/ prednisone and f/u if not better in a week with Dr Lake Bells, call sooner if new symptoms

## 2014-05-23 ENCOUNTER — Telehealth: Payer: Self-pay | Admitting: Pulmonary Disease

## 2014-05-23 NOTE — Telephone Encounter (Signed)
Called and spoke to pt. Pt stated she think she has a bite on her back. Pt stated it is no bigger than a quarter and is not showing up anywhere else on her body. Pt also stated that last night she woke up with urinary frequency during the night. Pt denies fever, change in SOB, change in cough, chest pain or tightness. Informed pt to call her PCP for further recs. Pt verbalized understanding and denied any further questions or concerns at this time.

## 2014-05-28 ENCOUNTER — Telehealth: Payer: Self-pay | Admitting: Internal Medicine

## 2014-05-28 NOTE — Progress Notes (Signed)
Quick Note:  LMTCB ______ 

## 2014-05-28 NOTE — Telephone Encounter (Signed)
Spoke with the pt and notified of her cxr results  She verbalized understanding  Nothing further needed

## 2014-05-30 NOTE — Progress Notes (Signed)
Quick Note:  Spoke with pt and notified of results per Dr. Wert. Pt verbalized understanding and denied any questions.  ______ 

## 2014-06-19 ENCOUNTER — Encounter (HOSPITAL_COMMUNITY): Payer: Self-pay | Admitting: Cardiology

## 2014-06-27 DIAGNOSIS — L219 Seborrheic dermatitis, unspecified: Secondary | ICD-10-CM | POA: Diagnosis not present

## 2014-07-21 ENCOUNTER — Encounter: Payer: Self-pay | Admitting: Pulmonary Disease

## 2014-07-21 ENCOUNTER — Ambulatory Visit (INDEPENDENT_AMBULATORY_CARE_PROVIDER_SITE_OTHER): Payer: Medicare Other | Admitting: Pulmonary Disease

## 2014-07-21 ENCOUNTER — Other Ambulatory Visit (INDEPENDENT_AMBULATORY_CARE_PROVIDER_SITE_OTHER): Payer: Medicare Other

## 2014-07-21 ENCOUNTER — Ambulatory Visit (INDEPENDENT_AMBULATORY_CARE_PROVIDER_SITE_OTHER)
Admission: RE | Admit: 2014-07-21 | Discharge: 2014-07-21 | Disposition: A | Discharge status: Home / Self Care | Payer: Medicare Other | Source: Ambulatory Visit | Attending: Pulmonary Disease | Admitting: Pulmonary Disease

## 2014-07-21 VITALS — BP 116/66 | HR 94 | Temp 97.0°F | Ht 64.0 in | Wt 153.0 lb

## 2014-07-21 DIAGNOSIS — R350 Frequency of micturition: Secondary | ICD-10-CM

## 2014-07-21 DIAGNOSIS — R358 Other polyuria: Secondary | ICD-10-CM | POA: Insufficient documentation

## 2014-07-21 DIAGNOSIS — R3589 Other polyuria: Secondary | ICD-10-CM

## 2014-07-21 DIAGNOSIS — J8410 Pulmonary fibrosis, unspecified: Secondary | ICD-10-CM | POA: Diagnosis not present

## 2014-07-21 DIAGNOSIS — R059 Cough, unspecified: Secondary | ICD-10-CM

## 2014-07-21 DIAGNOSIS — R0602 Shortness of breath: Secondary | ICD-10-CM | POA: Diagnosis not present

## 2014-07-21 DIAGNOSIS — R05 Cough: Secondary | ICD-10-CM | POA: Diagnosis not present

## 2014-07-21 DIAGNOSIS — J069 Acute upper respiratory infection, unspecified: Secondary | ICD-10-CM | POA: Diagnosis not present

## 2014-07-21 LAB — URINALYSIS, ROUTINE W REFLEX MICROSCOPIC
Bilirubin Urine: NEGATIVE
KETONES UR: NEGATIVE
NITRITE: POSITIVE — AB
PH: 6 (ref 5.0–8.0)
Specific Gravity, Urine: 1.025 (ref 1.000–1.030)
TOTAL PROTEIN, URINE-UPE24: NEGATIVE
URINE GLUCOSE: NEGATIVE
UROBILINOGEN UA: 0.2 (ref 0.0–1.0)

## 2014-07-21 MED ORDER — DOXYCYCLINE HYCLATE 100 MG PO TABS: 100.0000 mg | ORAL_TABLET | Freq: Two times a day (BID) | ORAL | Status: DC

## 2014-07-21 MED ORDER — BENZONATATE 200 MG PO CAPS: 200.0000 mg | ORAL_CAPSULE | Freq: Three times a day (TID) | ORAL | Status: DC | PRN

## 2014-07-21 MED ORDER — PREDNISONE 10 MG PO TABS: ORAL_TABLET | ORAL | Status: DC

## 2014-07-21 MED ORDER — HYDROCOD POLST-CHLORPHEN POLST 10-8 MG/5ML PO LQCR: 5.0000 mL | Freq: Every evening | ORAL | Status: DC | PRN

## 2014-07-21 NOTE — Assessment & Plan Note (Signed)
As noted, I believe that this cough is related to an episode of bronchitis. We will treat as such. However, because of her pulmonary fibrosis I explained to her that cough could come from that diagnosis, particularly if it has been progressing. To date, we have not seen evidence of significant progression. Her lung function and fibrosis has remained remarkably stable over the years.  Plan: -see acute bronchitis -Repeat pulmonary function testing, if clear evidence of progression then she and I discussed the possibility of a second opinion with an interstitial lung disease specialist.

## 2014-07-21 NOTE — Progress Notes (Signed)
Subjective:    Patient ID: [name] [name], female    DOB: 1930/11/19, 81 y.o.   MRN: [id number]  Synopsis: This is a very pleasant female who is diagnosed with interstitial lung disease in her mid 23s after evaluation at [facility] and [facility]. She was found to have a UIP pattern on CT scan and serology testing revealed a very high rheumatoid factor as well as anti-CCP antibody. She was followed by Dr. Elgie Collard at [facility] who felt like she had rheumatoid arthritis associated interstitial lung disease. He never treated her with immunosuppressive therapy. From 2000 07/09/2013 she's continued to follow in both [place] and due to her lung function has remained stable during that time. She has not developed new shortness of breath. She has had repeated episodes of pulmonary emboli. The most recent was in the summer of 2014 when she developed her fourth pulmonary embolism after returning from a trip to [place].   HPI Chief Complaint  Patient presents with  . Acute Visit    pt c/o increased fatigue, hoarseness, mostly prod cough with white mucus, is worse when laying down to the point where she has to sleep in her recliner Xfew weeks.     [name] [name] has been coughing a lot in the last few weeks.  She says that she fell again after the last visit. She fell while working on Christmas lights and she struck her head and lower back.  She has been coughing more since then.  She has been urinating more frequently since the fall.  She has noticed more cough when lying flat., however no fever or chills. Sh e has produced white sputum.  She has mild chest tightness with since then. She fell just before Christmas. She has had other muscle aches she feels is not related to the fall, specifically mshoulder aches.  She as been around others who were sick.    Past Medical History  Diagnosis Date  . Allergic rhinitis   . History of pulmonary embolism   . DVT (deep venous  thrombosis)   . Dyslipidemia   . Pulmonary fibrosis   . DJD (degenerative joint disease)   . Osteoporosis   . CAD (coronary artery disease)     Mild CAD by cath 2008       Review of Systems  Constitutional: Negative for fever, chills and fatigue.  HENT: Negative for congestion, postnasal drip and rhinorrhea.   Respiratory: Positive for cough and chest tightness. Negative for shortness of breath and wheezing.   Cardiovascular: Negative for chest pain, palpitations and leg swelling.       Objective:   Physical Exam  Filed Vitals:   07/21/14 1135  BP: 116/66  Pulse: 94  Temp: 97 F (36.1 C)  TempSrc: Oral  Height: 5\' 4"  (1.626 m)  Weight: 153 lb (69.4 kg)  SpO2: 95%  RA  Ambulated 500 feet on RA (forehead probe) and O2 saturation dropped no lower than 90%   Gen: well appearing, no acute distress HEENT: NCAT, PERRL, EOMi,  PULM: Crackles in bases CV: RRR, no mgr, no JVD AB: BS+, soft, nontender, no hsm Ext: warm, trace bilateral edema, no clubbing, no cyanosis       Assessment & Plan:   Postinflammatory pulmonary fibrosis As noted, I believe that this cough is related to an episode of bronchitis. We will treat as such. However, because of her pulmonary fibrosis I explained to her that cough could come from that  diagnosis, particularly if it has been progressing. To date, we have not seen evidence of significant progression. Her lung function and fibrosis has remained remarkably stable over the years.  Plan: -see acute bronchitis -Repeat pulmonary function testing, if clear evidence of progression then she and I discussed the possibility of a second opinion with an interstitial lung disease specialist.  Acute upper respiratory infection It sounds like [name] [name] has had another episode of acute bronchitis, though with her abnormal lung exam we will get a chest x-ray to ensure that there is no evidence of pneumonia. She is not having severe symptoms today. Her cough has  been exacerbated by acid reflux.  Plan: -Chest x-ray -Prednisone taper -Doxycycline -Tessalon to use during the day for cough -Test next use at night, advised not to use this with other sedating medications or alcohol. Advised not to drive after taking this.  Cough As noted above, will treat cough as such. Also, acid reflux is contributing. I advised her to stop eating within 3 hours of bedtime and to take Pepcid or Zantac at night as she is reluctant to take a proton pump inhibitor.  Polyuria This has been a problem in the last week. No other associated signs or symptoms.  Plan: -Check urinalysis with urine culture to look for evidence of UTI     Updated Medication List Outpatient Encounter Prescriptions as of 07/21/2014  Medication Sig  . cetirizine (ZYRTEC) 10 MG tablet Take 5-10 mg by mouth daily as needed for allergies.  . fluticasone (FLONASE) 50 MCG/ACT nasal spray Place 2 sprays into both nostrils daily as needed for allergies or rhinitis.  [name] Glycol-Propyl Glycol (SYSTANE) 0.4-0.3 % SOLN Apply 1 drop to eye daily as needed (for dry eyes).  . Probiotic Product (PROBIOTIC DAILY PO) Take by mouth daily.  [name] ALPRAZolam (XANAX) 0.5 MG tablet Take 0.25 mg by mouth as needed for anxiety.   . benzonatate (TESSALON) 200 MG capsule Take 1 capsule (200 mg total) by mouth 3 (three) times daily as needed for cough.  . chlorpheniramine-HYDROcodone (TUSSIONEX PENNKINETIC ER) 10-8 MG/5ML LQCR Take 5 mLs by mouth at bedtime as needed for cough.  . doxycycline (VIBRA-TABS) 100 MG tablet Take 1 tablet (100 mg total) by mouth 2 (two) times daily.  [name] omeprazole (PRILOSEC OTC) 20 MG tablet Take 20 mg by mouth daily as needed.  . predniSONE (DELTASONE) 10 MG tablet Take 40mg  po daily for 3 days, then take 30mg  po daily for 3 days, then take 20mg  po daily for two days, then take 10mg  po daily for 2 days  . [DISCONTINUED] azithromycin (ZITHROMAX) 250 MG tablet Take 2 on day one then 1 daily  x 4 days (Patient not taking: Reported on 07/21/2014)  . [DISCONTINUED] predniSONE (DELTASONE) 10 MG tablet Take  4 each am x 2 days,   2 each am x 2 days,  1 each am x 2 days and stop (Patient not taking: Reported on 07/21/2014)

## 2014-07-21 NOTE — Assessment & Plan Note (Signed)
It sounds like [name] [name] has had another episode of acute bronchitis, though with her abnormal lung exam we will get a chest x-ray to ensure that there is no evidence of pneumonia. She is not having severe symptoms today. Her cough has been exacerbated by acid reflux.  Plan: -Chest x-ray -Prednisone taper -Doxycycline -Tessalon to use during the day for cough -Test next use at night, advised not to use this with other sedating medications or alcohol. Advised not to drive after taking this.

## 2014-07-21 NOTE — Patient Instructions (Signed)
Will call you with the results of the Chest X-ray and urinalysis and lung function tests  For the cough: Take pepcid or zantac at night, don't eat within 3 hours of bedtime Take the tessalon perles during the daytime as needed for the cough Use the tussionex syrup at night only, this can make you drowsy so be careful with it, don't take it with alcohol or other sedating medications, don't take it and drive Take the prednisone taper Take the doxycycline for five days  For the urinary frequency: We will call you with the results of the urinalysis  If your lung function testing is worse then we will refer you to Dr. Chase Caller for a second opinion, otherwise keep your appointment with me in 6 months

## 2014-07-21 NOTE — Assessment & Plan Note (Signed)
As noted above, will treat cough as such. Also, acid reflux is contributing. I advised her to stop eating within 3 hours of bedtime and to take Pepcid or Zantac at night as she is reluctant to take a proton pump inhibitor.

## 2014-07-21 NOTE — Assessment & Plan Note (Signed)
This has been a problem in the last week. No other associated signs or symptoms.  Plan: -Check urinalysis with urine culture to look for evidence of UTI

## 2014-07-22 NOTE — Progress Notes (Signed)
Quick Note:  Pt aware of results/recs. ______

## 2014-07-22 NOTE — Progress Notes (Signed)
Quick Note:  Pt aware of results/recs. ______ 

## 2014-08-07 DIAGNOSIS — H25013 Cortical age-related cataract, bilateral: Secondary | ICD-10-CM | POA: Diagnosis not present

## 2014-08-07 DIAGNOSIS — H21233 Degeneration of iris (pigmentary), bilateral: Secondary | ICD-10-CM | POA: Diagnosis not present

## 2014-08-07 DIAGNOSIS — H40053 Ocular hypertension, bilateral: Secondary | ICD-10-CM | POA: Diagnosis not present

## 2014-08-07 DIAGNOSIS — H2513 Age-related nuclear cataract, bilateral: Secondary | ICD-10-CM | POA: Diagnosis not present

## 2014-08-21 DIAGNOSIS — H40053 Ocular hypertension, bilateral: Secondary | ICD-10-CM | POA: Diagnosis not present

## 2014-08-23 NOTE — Progress Notes (Signed)
No show

## 2014-09-03 DIAGNOSIS — H2512 Age-related nuclear cataract, left eye: Secondary | ICD-10-CM | POA: Diagnosis not present

## 2014-09-03 DIAGNOSIS — H40059 Ocular hypertension, unspecified eye: Secondary | ICD-10-CM | POA: Diagnosis not present

## 2014-09-03 DIAGNOSIS — H25012 Cortical age-related cataract, left eye: Secondary | ICD-10-CM | POA: Diagnosis not present

## 2014-09-03 DIAGNOSIS — H25812 Combined forms of age-related cataract, left eye: Secondary | ICD-10-CM | POA: Diagnosis not present

## 2014-09-03 DIAGNOSIS — H25042 Posterior subcapsular polar age-related cataract, left eye: Secondary | ICD-10-CM | POA: Diagnosis not present

## 2014-09-06 ENCOUNTER — Encounter (HOSPITAL_BASED_OUTPATIENT_CLINIC_OR_DEPARTMENT_OTHER): Payer: Self-pay | Admitting: *Deleted

## 2014-09-06 ENCOUNTER — Emergency Department (HOSPITAL_BASED_OUTPATIENT_CLINIC_OR_DEPARTMENT_OTHER)
Admission: EM | Admit: 2014-09-06 | Discharge: 2014-09-07 | Disposition: A | Discharge status: Home / Self Care | Payer: Medicare Other | Attending: Emergency Medicine | Admitting: Emergency Medicine

## 2014-09-06 DIAGNOSIS — Z86711 Personal history of pulmonary embolism: Secondary | ICD-10-CM | POA: Diagnosis not present

## 2014-09-06 DIAGNOSIS — Z86718 Personal history of other venous thrombosis and embolism: Secondary | ICD-10-CM | POA: Insufficient documentation

## 2014-09-06 DIAGNOSIS — S030XXA Dislocation of jaw, initial encounter: Secondary | ICD-10-CM | POA: Insufficient documentation

## 2014-09-06 DIAGNOSIS — Z87891 Personal history of nicotine dependence: Secondary | ICD-10-CM | POA: Diagnosis not present

## 2014-09-06 DIAGNOSIS — Z880 Allergy status to penicillin: Secondary | ICD-10-CM | POA: Insufficient documentation

## 2014-09-06 DIAGNOSIS — S0990XA Unspecified injury of head, initial encounter: Secondary | ICD-10-CM | POA: Diagnosis present

## 2014-09-06 DIAGNOSIS — I2510 Atherosclerotic heart disease of native coronary artery without angina pectoris: Secondary | ICD-10-CM | POA: Insufficient documentation

## 2014-09-06 DIAGNOSIS — Y998 Other external cause status: Secondary | ICD-10-CM | POA: Diagnosis not present

## 2014-09-06 DIAGNOSIS — Y929 Unspecified place or not applicable: Secondary | ICD-10-CM | POA: Diagnosis not present

## 2014-09-06 DIAGNOSIS — Z792 Long term (current) use of antibiotics: Secondary | ICD-10-CM | POA: Insufficient documentation

## 2014-09-06 DIAGNOSIS — Z8709 Personal history of other diseases of the respiratory system: Secondary | ICD-10-CM | POA: Diagnosis not present

## 2014-09-06 DIAGNOSIS — Z8639 Personal history of other endocrine, nutritional and metabolic disease: Secondary | ICD-10-CM | POA: Diagnosis not present

## 2014-09-06 DIAGNOSIS — Z8739 Personal history of other diseases of the musculoskeletal system and connective tissue: Secondary | ICD-10-CM | POA: Diagnosis not present

## 2014-09-06 DIAGNOSIS — X58XXXA Exposure to other specified factors, initial encounter: Secondary | ICD-10-CM | POA: Diagnosis not present

## 2014-09-06 DIAGNOSIS — Z79899 Other long term (current) drug therapy: Secondary | ICD-10-CM | POA: Diagnosis not present

## 2014-09-06 DIAGNOSIS — Z9889 Other specified postprocedural states: Secondary | ICD-10-CM | POA: Insufficient documentation

## 2014-09-06 DIAGNOSIS — K120 Recurrent oral aphthae: Secondary | ICD-10-CM | POA: Insufficient documentation

## 2014-09-06 DIAGNOSIS — Y939 Activity, unspecified: Secondary | ICD-10-CM | POA: Diagnosis not present

## 2014-09-06 DIAGNOSIS — S0300XA Dislocation of jaw, unspecified side, initial encounter: Secondary | ICD-10-CM

## 2014-09-06 NOTE — ED Provider Notes (Signed)
CSN: [id number]     Arrival date & time 09/06/14  2314 History  This chart was scribed for Xavior Niazi Alfonso Patten, MD by Jeanell Sparrow, ED Scribe. This patient was seen in room MH12/MH12 and the patient's care was started at 11:57 PM.   Chief Complaint  Patient presents with  . Jaw Pain   Patient is a 81 y.o. female presenting with general illness. The history is provided by the patient. No language interpreter was used.  Illness Location:  Right jaw Severity:  Severe Onset quality:  Sudden Timing:  Constant Progression:  Unchanged Chronicity:  New Context:  Eating food Relieved by:  None Worsened by:  Opening mouth Ineffective treatments:  None Associated symptoms: no fever, no shortness of breath, no sore throat and no vomiting    HPI Comments: [name] [name] is a 81 y.o. female who presents to the Emergency Department complaining of constant moderate right jaw pain that started today. She reports that she suddenly started having pain while eating. She states that she currently cannot open her jaw too wide. She reports that opening her mouth exacerbates the pain, but no pain is present otherwise. She states that she has a hx of dental implants from 6 years ago.   Oral surgeon- Dr. Sabra Heck  Past Medical History  Diagnosis Date  . Allergic rhinitis   . History of pulmonary embolism   . DVT (deep venous thrombosis)   . Dyslipidemia   . Pulmonary fibrosis   . DJD (degenerative joint disease)   . Osteoporosis   . CAD (coronary artery disease)     Mild CAD by cath 2008   Past Surgical History  Procedure Laterality Date  . Abdominal hysterectomy    . Appendectomy    . Total knee arthroplasty  1997    Bilateral  . Tonsillectomy    . Left heart catheterization with coronary angiogram N/A 06/18/2013    Procedure: LEFT HEART CATHETERIZATION WITH CORONARY ANGIOGRAM;  Surgeon: Peter M Martinique, MD;  Location: [facility] CATH LAB;  Service: Cardiovascular;  Laterality: N/A;  . Cataract extraction      Family History  Problem Relation Age of Onset  . Kidney disease Daughter 5  . Cancer Daughter   . Heart attack Father 16  . Alzheimer's disease Sister    History  Substance Use Topics  . Smoking status: Former Smoker -- 0.30 packs/day for 20 years    Types: Cigarettes    Quit date: 07/11/1973  . Smokeless tobacco: Never Used  . Alcohol Use: 0.0 oz/week     Comment: approx 1 glass wine per week   OB History    Gravida Para Term Preterm AB TAB SAB Ectopic Multiple Living   4 2 2        0     Review of Systems  Constitutional: Negative for fever.  HENT: Positive for dental problem. Negative for sore throat.   Respiratory: Negative for shortness of breath.   Gastrointestinal: Negative for vomiting.  All other systems reviewed and are negative.   Allergies  Crestor; Lactose intolerance (gi); Penicillins; and Niacin  Home Medications   Prior to Admission medications   Medication Sig Start Date End Date Taking? Authorizing Provider  ALPRAZolam Duanne Moron) 0.5 MG tablet Take 0.25 mg by mouth as needed for anxiety.     Historical Provider, MD  benzonatate (TESSALON) 200 MG capsule Take 1 capsule (200 mg total) by mouth 3 (three) times daily as needed for cough. 07/21/14   Juanito Doom,  MD  cetirizine (ZYRTEC) 10 MG tablet Take 5-10 mg by mouth daily as needed for allergies.    Historical Provider, MD  chlorpheniramine-HYDROcodone (TUSSIONEX PENNKINETIC ER) 10-8 MG/5ML LQCR Take 5 mLs by mouth at bedtime as needed for cough. 07/21/14   Juanito Doom, MD  doxycycline (VIBRA-TABS) 100 MG tablet Take 1 tablet (100 mg total) by mouth 2 (two) times daily. 07/21/14   Juanito Doom, MD  fluticasone (FLONASE) 50 MCG/ACT nasal spray Place 2 sprays into both nostrils daily as needed for allergies or rhinitis.    Historical Provider, MD  omeprazole (PRILOSEC OTC) 20 MG tablet Take 20 mg by mouth daily as needed.    Historical Provider, MD  Polyethyl Glycol-Propyl Glycol (SYSTANE)  0.4-0.3 % SOLN Apply 1 drop to eye daily as needed (for dry eyes).    Historical Provider, MD  predniSONE (DELTASONE) 10 MG tablet Take 40mg  po daily for 3 days, then take 30mg  po daily for 3 days, then take 20mg  po daily for two days, then take 10mg  po daily for 2 days 07/21/14   Juanito Doom, MD  Probiotic Product (PROBIOTIC DAILY PO) Take by mouth daily.    Historical Provider, MD   BP 158/84 mmHg  Pulse 102  Temp(Src) 97.6 F (36.4 C) (Oral)  Ht 5\' 4"  (1.626 m)  Wt 156 lb (70.761 kg)  BMI 26.76 kg/m2  SpO2 91% Physical Exam  Constitutional: She is oriented to person, place, and time. She appears well-developed and well-nourished. No distress.  HENT:  Head: Normocephalic and atraumatic.  Mouth/Throat: Oropharynx is clear and moist.  Mouth: A lower brass rod with retraction of gum near the right lower incisor. Cankor sore Upper right rod present with ulceration. Jaw is well-seated. No spasm.  Tracheas midline. Ill filling dentures when patient opens mouth there is an audible click  Eyes: EOM are normal. Pupils are equal, round, and reactive to light.  Neck: Normal range of motion. Neck supple. No tracheal deviation present.  Cardiovascular: Normal rate, regular rhythm and normal heart sounds.  Exam reveals no gallop and no friction rub.   No murmur heard. Pulmonary/Chest: Effort normal and breath sounds normal. No respiratory distress. She has no wheezes. She has no rales.  Abdominal: Soft. Bowel sounds are normal. There is no tenderness. There is no rebound and no guarding.  Musculoskeletal: Normal range of motion.  Neurological: She is alert and oriented to person, place, and time. She has normal reflexes.  Skin: Skin is warm and dry.  Psychiatric: She has a normal mood and affect. Her behavior is normal.  Nursing note and vitals reviewed.   ED Course  Procedures (including critical care time) DIAGNOSTIC STUDIES: Oxygen Saturation is 91% on RA, normal by my interpretation.     COORDINATION OF CARE: 12:01 AM- Pt advised of plan for treatment and pt agrees.  Labs Review Labs Reviewed - No data to display  Imaging Review No results found.   EKG Interpretation None      MDM   Final diagnoses:  None    Canker sores and TMJ pain pain medication.  Patient states vicodin usually work for her so will prescribe this for pain.  Soft foods and follow up with Dr. Sabra Heck Monday   I personally performed the services described in this documentation, which was scribed in my presence. The recorded information has been reviewed and is accurate.      Carlisle Beers, MD 09/07/14 [phone]

## 2014-09-06 NOTE — ED Notes (Signed)
Symptoms of TMJ?

## 2014-09-06 NOTE — ED Notes (Signed)
Pt here with pain in right jaw that began suddenly while eating a salad.  Pt also reports that her jaw is not able to be opened wide at this time.

## 2014-09-07 ENCOUNTER — Encounter (HOSPITAL_BASED_OUTPATIENT_CLINIC_OR_DEPARTMENT_OTHER): Payer: Self-pay | Admitting: Emergency Medicine

## 2014-09-07 DIAGNOSIS — S030XXA Dislocation of jaw, initial encounter: Secondary | ICD-10-CM | POA: Diagnosis not present

## 2014-09-07 MED ORDER — HYDROCODONE-ACETAMINOPHEN 5-325 MG PO TABS
1.0000 | ORAL_TABLET | Freq: Once | ORAL | Status: AC
  Administered 2014-09-07: 1 via ORAL
  Filled 2014-09-07: qty 1

## 2014-09-07 MED ORDER — HYDROCODONE-ACETAMINOPHEN 5-325 MG PO TABS: 1.0000 | ORAL_TABLET | Freq: Four times a day (QID) | ORAL | Status: DC | PRN

## 2014-09-11 ENCOUNTER — Encounter: Payer: Self-pay | Admitting: Internal Medicine

## 2014-09-11 ENCOUNTER — Other Ambulatory Visit (INDEPENDENT_AMBULATORY_CARE_PROVIDER_SITE_OTHER): Payer: Medicare Other

## 2014-09-11 ENCOUNTER — Ambulatory Visit (INDEPENDENT_AMBULATORY_CARE_PROVIDER_SITE_OTHER): Payer: Medicare Other | Admitting: Internal Medicine

## 2014-09-11 VITALS — BP 104/68 | HR 92 | Temp 97.5°F | Resp 20 | Ht 64.0 in | Wt 147.1 lb

## 2014-09-11 DIAGNOSIS — I868 Varicose veins of other specified sites: Secondary | ICD-10-CM

## 2014-09-11 DIAGNOSIS — M79661 Pain in right lower leg: Secondary | ICD-10-CM | POA: Diagnosis not present

## 2014-09-11 DIAGNOSIS — E789 Disorder of lipoprotein metabolism, unspecified: Secondary | ICD-10-CM

## 2014-09-11 DIAGNOSIS — Z13220 Encounter for screening for lipoid disorders: Secondary | ICD-10-CM | POA: Diagnosis not present

## 2014-09-11 DIAGNOSIS — R300 Dysuria: Secondary | ICD-10-CM

## 2014-09-11 DIAGNOSIS — Z86718 Personal history of other venous thrombosis and embolism: Secondary | ICD-10-CM

## 2014-09-11 DIAGNOSIS — R6884 Jaw pain: Secondary | ICD-10-CM | POA: Diagnosis not present

## 2014-09-11 DIAGNOSIS — J8410 Pulmonary fibrosis, unspecified: Secondary | ICD-10-CM

## 2014-09-11 DIAGNOSIS — I8390 Asymptomatic varicose veins of unspecified lower extremity: Secondary | ICD-10-CM

## 2014-09-11 DIAGNOSIS — R358 Other polyuria: Secondary | ICD-10-CM

## 2014-09-11 DIAGNOSIS — R3589 Other polyuria: Secondary | ICD-10-CM

## 2014-09-11 DIAGNOSIS — E785 Hyperlipidemia, unspecified: Secondary | ICD-10-CM

## 2014-09-11 LAB — URINALYSIS, ROUTINE W REFLEX MICROSCOPIC
Bilirubin Urine: NEGATIVE
Ketones, ur: NEGATIVE
NITRITE: NEGATIVE
SPECIFIC GRAVITY, URINE: 1.02 (ref 1.000–1.030)
UROBILINOGEN UA: 0.2 (ref 0.0–1.0)
Urine Glucose: NEGATIVE
pH: 7 (ref 5.0–8.0)

## 2014-09-11 LAB — COMPREHENSIVE METABOLIC PANEL
ALBUMIN: 3.8 g/dL (ref 3.5–5.2)
ALT: 11 U/L (ref 0–35)
AST: 21 U/L (ref 0–37)
Alkaline Phosphatase: 87 U/L (ref 39–117)
BUN: 11 mg/dL (ref 6–23)
CALCIUM: 9.7 mg/dL (ref 8.4–10.5)
CO2: 27 meq/L (ref 19–32)
CREATININE: 0.78 mg/dL (ref 0.40–1.20)
Chloride: 106 mEq/L (ref 96–112)
GFR: 75.16 mL/min (ref >60.00)
Glucose, Bld: 95 mg/dL (ref 70–99)
Potassium: 3.9 mEq/L (ref 3.5–5.1)
SODIUM: 139 meq/L (ref 135–145)
TOTAL PROTEIN: 8 g/dL (ref 6.0–8.3)
Total Bilirubin: 0.5 mg/dL (ref 0.2–1.2)

## 2014-09-11 LAB — LIPID PANEL
CHOLESTEROL: 234 mg/dL — AB (ref 0–200)
HDL: 41.7 mg/dL (ref >39.00)
LDL Cholesterol: 155 mg/dL — ABNORMAL HIGH (ref 0–99)
NonHDL: 192.3
Total CHOL/HDL Ratio: 6
Triglycerides: 185 mg/dL — ABNORMAL HIGH (ref 0.0–149.0)
VLDL: 37 mg/dL (ref 0.0–40.0)

## 2014-09-11 LAB — SEDIMENTATION RATE: SED RATE: 41 mm/h — AB (ref 0–22)

## 2014-09-11 MED ORDER — ALPRAZOLAM 0.5 MG PO TABS: 0.2500 mg | ORAL_TABLET | ORAL | Status: DC | PRN

## 2014-09-11 MED ORDER — DICLOFENAC SODIUM 1 % TD GEL: 2.0000 g | Freq: Three times a day (TID) | TRANSDERMAL | Status: DC | PRN

## 2014-09-11 NOTE — Progress Notes (Signed)
Pre visit review using our clinic review tool, if applicable. No additional management support is needed unless otherwise documented below in the visit note. 

## 2014-09-11 NOTE — Assessment & Plan Note (Signed)
She does have acute pain (without swelling) in the right calf and during which she has been more stationary. Likely is caused by the varicose veins but will check doppler LE to be sure. She is not anticoagulated at this time.

## 2014-09-11 NOTE — Progress Notes (Signed)
   Subjective:    Patient ID: [name] [name], female    DOB: [birth year], 81 y.o.   MRN: [id number]  HPI The patient is a new patient 81 YO female coming in with several complaints. The most pressing of which is right leg and calf pain. She has PMH of multiple blood clots including PE. She was taken off her blood thinner some time in the last several years ago as she had been falling a lot (likely they were concerned about falls and bleeding risk). She has not been on any travels recently. She has been more sedentary recently because she has been doing more with the computer but is still walking around some during the day. She denies swelling in the right calf but has noticed some vein looking bumps that have come up and been painful the last several days. Denies any bleeding. Denies redness or fevers.   PMH, [facility], social history reviewed and updated during today's visit.   Review of Systems  Constitutional: Positive for activity change. Negative for fever, chills, appetite change, fatigue and unexpected weight change.  HENT: Negative.   Eyes: Negative.   Respiratory: Positive for shortness of breath. Negative for cough, chest tightness and wheezing.        With exertion, stable, chronic  Cardiovascular: Negative for chest pain, palpitations and leg swelling.  Gastrointestinal: Negative for nausea, abdominal pain, diarrhea, constipation and abdominal distention.  Musculoskeletal: Positive for back pain and arthralgias. Negative for myalgias and gait problem.  Skin:       Veins prominent in the right leg  Neurological: Negative.   Psychiatric/Behavioral: Negative.       Objective:   Physical Exam  Constitutional: She is oriented to person, place, and time. She appears well-developed and well-nourished.  Slightly frail appearing  HENT:  Head: Normocephalic and atraumatic.  Eyes: EOM are normal.  Neck: Normal range of motion.  Cardiovascular: Normal rate and regular rhythm.     Pulmonary/Chest: Effort normal. No respiratory distress. She has wheezes. She has no rales.  Abdominal: Soft. Bowel sounds are normal.  Musculoskeletal: She exhibits tenderness.  Neurological: She is alert and oriented to person, place, and time. Coordination normal.  Skin: Skin is warm and dry.  No overt swelling of the right calf, but calf tenderness to palpation, some prominent veins on the right calf and thigh but tender more diffusely.   Psychiatric: She has a normal mood and affect.   Filed Vitals:   09/11/14 1005  BP: 104/68  Pulse: 92  Temp: 97.5 F (36.4 C)  TempSrc: Oral  Resp: 20  Height: 5\' 4"  (1.626 m)  Weight: 147 lb 1.9 oz (66.733 kg)  SpO2: 95%      Assessment & Plan:

## 2014-09-11 NOTE — Assessment & Plan Note (Signed)
Check U/A today as patient is still having those symptoms and is not sure that the UTI cleared up.

## 2014-09-11 NOTE — Assessment & Plan Note (Signed)
Try voltaren gel for pain. Checking [place] to rule out DVT. Will refer to vascular specialist to evaluate for possible treatment.

## 2014-09-11 NOTE — Patient Instructions (Addendum)
We will check on the blood work today to make sure everything is okay.  We will check an ultrasound of the legs to make sure we are not missing a blood blot. If there is no blood clot we will send you to the vein doctor about the varicose veins.  In the meantime I have sent in some voltaren gel which you can rub where you have pain on the leg and on your thumb.  I have refilled the xanax (alprazolam) as well for you.  Varicose Veins Varicose veins are veins that have become enlarged and twisted. CAUSES This condition is the result of valves in the veins not working properly. Valves in the veins help return blood from the leg to the heart. If these valves are damaged, blood flows backwards and backs up into the veins in the leg near the skin. This causes the veins to become larger. People who are on their feet a lot, who are pregnant, or who are overweight are more likely to develop varicose veins. SYMPTOMS   Bulging, twisted-appearing, bluish veins, most commonly found on the legs.  Leg pain or a feeling of heaviness. These symptoms may be worse at the end of the day.  Leg swelling.  Skin color changes. DIAGNOSIS  Varicose veins can usually be diagnosed with an exam of your legs by your caregiver. He or she may recommend an ultrasound of your leg veins. TREATMENT  Most varicose veins can be treated at home.However, other treatments are available for people who have persistent symptoms or who want to treat the cosmetic appearance of the varicose veins. These include:  Laser treatment of very small varicose veins.  Medicine that is shot (injected) into the vein. This medicine hardens the walls of the vein and closes off the vein. This treatment is called sclerotherapy. Afterwards, you may need to wear clothing or bandages that apply pressure.  Surgery. HOME CARE INSTRUCTIONS   Do not stand or sit in one position for long periods of time. Do not sit with your legs crossed. Rest with  your legs raised during the day.  Wear elastic stockings or support hose. Do not wear other tight, encircling garments around the legs, pelvis, or waist.  Walk as much as possible to increase blood flow.  Raise the foot of your bed at night with 2-inch blocks.  If you get a cut in the skin over the vein and the vein bleeds, lie down with your leg raised and press on it with a clean cloth until the bleeding stops. Then place a bandage (dressing) on the cut. See your caregiver if it continues to bleed or needs stitches. SEEK MEDICAL CARE IF:   The skin around your ankle starts to break down.  You have pain, redness, tenderness, or hard swelling developing in your leg over a vein.  You are uncomfortable due to leg pain. Document Released: 04/06/2005 Document Revised: 09/19/2011 Document Reviewed: 08/23/2010 [facility] Patient Information 2015 [place], [place]. This information is not intended to replace advice given to you by your health care provider. Make sure you discuss any questions you have with your health care provider.

## 2014-09-11 NOTE — Assessment & Plan Note (Signed)
Stable at this time. Did well with acute bronchitis treatment to get back to her baseline. Some dyspnea with exertion. Does not require oxygen with exertion. Check ESR today.

## 2014-09-11 NOTE — Assessment & Plan Note (Signed)
Check lipid panel today as not done in several years.

## 2014-09-16 ENCOUNTER — Other Ambulatory Visit: Payer: Self-pay | Admitting: Internal Medicine

## 2014-09-16 MED ORDER — SULFAMETHOXAZOLE-TRIMETHOPRIM 800-160 MG PO TABS: 1.0000 | ORAL_TABLET | Freq: Two times a day (BID) | ORAL | Status: DC

## 2014-09-17 DIAGNOSIS — H25811 Combined forms of age-related cataract, right eye: Secondary | ICD-10-CM | POA: Diagnosis not present

## 2014-09-17 DIAGNOSIS — H2511 Age-related nuclear cataract, right eye: Secondary | ICD-10-CM | POA: Diagnosis not present

## 2014-09-17 DIAGNOSIS — H25011 Cortical age-related cataract, right eye: Secondary | ICD-10-CM | POA: Diagnosis not present

## 2014-09-24 DIAGNOSIS — R312 Other microscopic hematuria: Secondary | ICD-10-CM | POA: Diagnosis not present

## 2014-09-24 DIAGNOSIS — N3281 Overactive bladder: Secondary | ICD-10-CM | POA: Diagnosis not present

## 2014-09-25 ENCOUNTER — Ambulatory Visit: Payer: Medicare Other | Admitting: Internal Medicine

## 2014-10-02 ENCOUNTER — Ambulatory Visit: Payer: Medicare Other | Admitting: Internal Medicine

## 2014-11-04 DIAGNOSIS — J8410 Pulmonary fibrosis, unspecified: Secondary | ICD-10-CM | POA: Diagnosis not present

## 2014-11-04 DIAGNOSIS — M0589 Other rheumatoid arthritis with rheumatoid factor of multiple sites: Secondary | ICD-10-CM | POA: Diagnosis not present

## 2014-11-04 DIAGNOSIS — R768 Other specified abnormal immunological findings in serum: Secondary | ICD-10-CM | POA: Diagnosis not present

## 2014-11-04 DIAGNOSIS — M2550 Pain in unspecified joint: Secondary | ICD-10-CM | POA: Diagnosis not present

## 2014-11-05 DIAGNOSIS — H2640 Unspecified secondary cataract: Secondary | ICD-10-CM | POA: Diagnosis not present

## 2014-11-05 DIAGNOSIS — H26492 Other secondary cataract, left eye: Secondary | ICD-10-CM | POA: Diagnosis not present

## 2014-11-11 DIAGNOSIS — M5033 Other cervical disc degeneration, cervicothoracic region: Secondary | ICD-10-CM | POA: Diagnosis not present

## 2014-11-11 DIAGNOSIS — M9913 Subluxation complex (vertebral) of lumbar region: Secondary | ICD-10-CM | POA: Diagnosis not present

## 2014-11-11 DIAGNOSIS — M9911 Subluxation complex (vertebral) of cervical region: Secondary | ICD-10-CM | POA: Diagnosis not present

## 2014-11-11 DIAGNOSIS — M5136 Other intervertebral disc degeneration, lumbar region: Secondary | ICD-10-CM | POA: Diagnosis not present

## 2014-11-12 DIAGNOSIS — M5031 Other cervical disc degeneration,  high cervical region: Secondary | ICD-10-CM | POA: Diagnosis not present

## 2014-11-12 DIAGNOSIS — M9902 Segmental and somatic dysfunction of thoracic region: Secondary | ICD-10-CM | POA: Diagnosis not present

## 2014-11-12 DIAGNOSIS — M542 Cervicalgia: Secondary | ICD-10-CM | POA: Diagnosis not present

## 2014-11-12 DIAGNOSIS — M9901 Segmental and somatic dysfunction of cervical region: Secondary | ICD-10-CM | POA: Diagnosis not present

## 2014-11-13 DIAGNOSIS — R768 Other specified abnormal immunological findings in serum: Secondary | ICD-10-CM | POA: Diagnosis not present

## 2014-11-13 DIAGNOSIS — M542 Cervicalgia: Secondary | ICD-10-CM | POA: Diagnosis not present

## 2014-11-13 DIAGNOSIS — M0589 Other rheumatoid arthritis with rheumatoid factor of multiple sites: Secondary | ICD-10-CM | POA: Diagnosis not present

## 2014-11-13 DIAGNOSIS — M9901 Segmental and somatic dysfunction of cervical region: Secondary | ICD-10-CM | POA: Diagnosis not present

## 2014-11-13 DIAGNOSIS — M9902 Segmental and somatic dysfunction of thoracic region: Secondary | ICD-10-CM | POA: Diagnosis not present

## 2014-11-13 DIAGNOSIS — J8410 Pulmonary fibrosis, unspecified: Secondary | ICD-10-CM | POA: Diagnosis not present

## 2014-11-13 DIAGNOSIS — M5031 Other cervical disc degeneration,  high cervical region: Secondary | ICD-10-CM | POA: Diagnosis not present

## 2014-11-17 DIAGNOSIS — M9902 Segmental and somatic dysfunction of thoracic region: Secondary | ICD-10-CM | POA: Diagnosis not present

## 2014-11-17 DIAGNOSIS — M5031 Other cervical disc degeneration,  high cervical region: Secondary | ICD-10-CM | POA: Diagnosis not present

## 2014-11-17 DIAGNOSIS — M9901 Segmental and somatic dysfunction of cervical region: Secondary | ICD-10-CM | POA: Diagnosis not present

## 2014-11-17 DIAGNOSIS — M542 Cervicalgia: Secondary | ICD-10-CM | POA: Diagnosis not present

## 2014-12-01 ENCOUNTER — Encounter: Payer: Self-pay | Admitting: *Deleted

## 2014-12-02 ENCOUNTER — Ambulatory Visit (INDEPENDENT_AMBULATORY_CARE_PROVIDER_SITE_OTHER): Payer: Medicare Other | Admitting: Nurse Practitioner

## 2014-12-02 ENCOUNTER — Encounter: Payer: Self-pay | Admitting: Nurse Practitioner

## 2014-12-02 ENCOUNTER — Encounter: Payer: Self-pay | Admitting: *Deleted

## 2014-12-02 VITALS — BP 110/72 | HR 88 | Temp 97.8°F | Resp 20 | Ht 64.57 in | Wt 144.2 lb

## 2014-12-02 DIAGNOSIS — M069 Rheumatoid arthritis, unspecified: Secondary | ICD-10-CM

## 2014-12-02 DIAGNOSIS — G479 Sleep disorder, unspecified: Secondary | ICD-10-CM | POA: Diagnosis not present

## 2014-12-02 DIAGNOSIS — J3089 Other allergic rhinitis: Secondary | ICD-10-CM | POA: Diagnosis not present

## 2014-12-02 DIAGNOSIS — I25110 Atherosclerotic heart disease of native coronary artery with unstable angina pectoris: Secondary | ICD-10-CM

## 2014-12-02 DIAGNOSIS — E785 Hyperlipidemia, unspecified: Secondary | ICD-10-CM

## 2014-12-02 DIAGNOSIS — M79661 Pain in right lower leg: Secondary | ICD-10-CM

## 2014-12-02 DIAGNOSIS — J309 Allergic rhinitis, unspecified: Secondary | ICD-10-CM | POA: Insufficient documentation

## 2014-12-02 NOTE — Progress Notes (Addendum)
Patient ID: [name] [name], female   DOB: 25-Jun-1931, 81 y.o.   MRN: [id number]    PCP: No primary care provider on file.  Allergies  Allergen Reactions  . Crestor [Rosuvastatin] Other (See Comments)    Muscle pain  . Lactose Intolerance (Gi) Other (See Comments)    Stomach upset  . Penicillins Hives  . Niacin Hives    Chief Complaint  Patient presents with  . Establish Care     HPI: Patient is a 81 y.o. female seen in the office today to establish care. Has moved from [place] 10 years. Previously Dr Forde Dandy for primary care but thought a geriatric practice would beneficial.  Saw rheumatogist for RA, placed her on prednisone and arava, but she reports these medications do not agree with her and she plans to start the hallelujah diet (supplement) which has worked for her in the past.    Reports she felt like she may have been blood in her stools, her niece made the comment it could have been the berries in her new diet.   Right leg has been hurting in the lower calf area, close to ankle. Yesterday she could hardly walk but pain comes and goes, today is much better, concerned due to hx of DVT. No swelling or heat reported  No lightheadedness, no dizziness, no worsening shortness of breath, no chest pains.   Advanced Directive information Does patient have an advance directive?: Yes Review of Systems:  Review of Systems  Constitutional: Negative for activity change, appetite change, fatigue and unexpected weight change.  HENT: Negative for congestion and hearing loss.   Eyes: Negative.   Respiratory: Negative for cough and shortness of breath.   Cardiovascular: Negative for chest pain, palpitations and leg swelling.  Gastrointestinal: Negative for abdominal pain, diarrhea and constipation.  Genitourinary: Negative for dysuria.  Musculoskeletal: Positive for arthralgias. Negative for myalgias.  Skin: Negative for color change and wound.  Allergic/Immunologic: Positive for  environmental allergies.  Neurological: Negative for dizziness and weakness.  Psychiatric/Behavioral: Negative for behavioral problems, confusion and agitation.    Past Medical History  Diagnosis Date  . Allergic rhinitis   . History of pulmonary embolism   . DVT (deep venous thrombosis)   . Dyslipidemia   . Pulmonary fibrosis   . Osteoporosis   . CAD (coronary artery disease)     Mild CAD by cath 2008  . Hyperlipidemia   . DJD (degenerative joint disease)     rheumatoid  . Rheumatoid arthritis   . Insomnia   . Allergy    Past Surgical History  Procedure Laterality Date  . Total knee arthroplasty  1997, 2007    Bilateral  . Tonsillectomy  1941, 1951  . Left heart catheterization with coronary angiogram N/A 06/18/2013    Procedure: LEFT HEART CATHETERIZATION WITH CORONARY ANGIOGRAM;  Surgeon: Peter M Martinique, MD;  Location: [facility] CATH LAB;  Service: Cardiovascular;  Laterality: N/A;  . Cataract extraction    . Abdominal hysterectomy  1974  . Appendectomy  1959   Social History:   reports that she quit smoking about 41 years ago. Her smoking use included Cigarettes. She has a 6 pack-year smoking history. She has never used smokeless tobacco. She reports that she drinks alcohol. She reports that she does not use illicit drugs.  Family History  Problem Relation Age of Onset  . Kidney disease Daughter 5  . Cancer Daughter   . Heart attack Father 62  . Alzheimer's disease Sister   .  Heart disease Sister   . Alzheimer's disease Sister     Medications: Patient's Medications  New Prescriptions   No medications on file  Previous Medications   ALPRAZOLAM (XANAX) 0.5 MG TABLET    Take 0.5 tablets (0.25 mg total) by mouth as needed for anxiety.   CETIRIZINE (ZYRTEC) 10 MG TABLET    Take 5-10 mg by mouth daily as needed for allergies.   FLUTICASONE (FLONASE) 50 MCG/ACT NASAL SPRAY    Place 2 sprays into both nostrils daily as needed for allergies or rhinitis.   LATANOPROST  (XALATAN) 0.005 % OPHTHALMIC SOLUTION    Place 1 drop into both eyes at bedtime.    LEFLUNOMIDE (ARAVA) 10 MG TABLET    Take 10 mg by mouth daily.   POLYETHYL GLYCOL-PROPYL GLYCOL (SYSTANE) 0.4-0.3 % SOLN    Apply 1 drop to eye daily as needed (for dry eyes).  Modified Medications   No medications on file  Discontinued Medications   BENZONATATE (TESSALON) 200 MG CAPSULE    Take 1 capsule (200 mg total) by mouth 3 (three) times daily as needed for cough.   CHLORPHENIRAMINE-HYDROCODONE (TUSSIONEX PENNKINETIC ER) 10-8 MG/5ML LQCR    Take 5 mLs by mouth at bedtime as needed for cough.   DICLOFENAC SODIUM (VOLTAREN) 1 % GEL    Apply 2 g topically 3 (three) times daily as needed.   DUREZOL 0.05 % EMUL       FLUTICASONE (CUTIVATE) 0.05 % CREAM       HYDROCODONE-ACETAMINOPHEN (NORCO) 5-325 MG PER TABLET    Take 1 tablet by mouth every 6 (six) hours as needed for severe pain.   OMEPRAZOLE-SODIUM BICARBONATE (ZEGERID) 40-1100 MG PER CAPSULE    Take 1 capsule by mouth daily before breakfast.   PROBIOTIC PRODUCT (PROBIOTIC DAILY PO)    Take by mouth daily.   SULFAMETHOXAZOLE-TRIMETHOPRIM (SEPTRA DS) 800-160 MG PER TABLET    Take 1 tablet by mouth 2 (two) times daily.   VIGAMOX 0.5 % OPHTHALMIC SOLUTION         Physical Exam:  Filed Vitals:   12/02/14 0847  BP: 110/72  Pulse: 88  Temp: 97.8 F (36.6 C)  TempSrc: Oral  Resp: 20  Height: 5' 4.57" (1.64 m)  Weight: 144 lb 3.2 oz (65.409 kg)  SpO2: 98%    Physical Exam  Constitutional: She is oriented to person, place, and time. She appears well-developed and well-nourished. No distress.  HENT:  Head: Normocephalic and atraumatic.  Mouth/Throat: Oropharynx is clear and moist. No oropharyngeal exudate.  Eyes: Conjunctivae are normal. Pupils are equal, round, and reactive to light.  Neck: Normal range of motion. Neck supple.  Cardiovascular: Normal rate, regular rhythm and normal heart sounds.   Pulmonary/Chest: Effort normal and breath sounds  normal.  Abdominal: Soft. Bowel sounds are normal.  Genitourinary: Rectum normal. Guaiac stool: no blood noted during rectal exam, not enough stool for guaiac   Musculoskeletal: She exhibits no edema or tenderness.  Tenderness noted on exam to bilateral LE around ankles, no increased pain with movement or ROM. Normal ROM, no swelling or heat noted  Neurological: She is alert and oriented to person, place, and time.  Skin: Skin is warm and dry. She is not diaphoretic.  Psychiatric: She has a normal mood and affect.    Labs reviewed: Basic Metabolic Panel:  Recent Labs  09/11/14 1057  NA 139  K 3.9  CL 106  CO2 27  GLUCOSE 95  BUN 11  CREATININE 0.78  CALCIUM 9.7   Liver Function Tests:  Recent Labs  09/11/14 1057  AST 21  ALT 11  ALKPHOS 87  BILITOT 0.5  PROT 8.0  ALBUMIN 3.8   No results for input(s): LIPASE, AMYLASE in the last 8760 hours. No results for input(s): AMMONIA in the last 8760 hours. CBC: No results for input(s): WBC, NEUTROABS, HGB, HCT, MCV, PLT in the last 8760 hours. Lipid Panel:  Recent Labs  09/11/14 1057  CHOL 234*  HDL 41.70  LDLCALC 155*  TRIG 185.0*  CHOLHDL 6   TSH: No results for input(s): TSH in the last 8760 hours. A1C: No results found for: HGBA1C   Assessment/Plan  1. Other allergic rhinitis -conts flonase and zyrtec  2. Coronary artery disease involving native coronary artery of native heart with unstable angina pectoris -hx of CAD, No complaints of chest pains at this time - CBC with Differential; Future - Basic metabolic panel; Future  3. Dyslipidemia Elevated lipids, did not tolerate statin, attempting dietary changes   4. Rheumatoid arthritis -followed with rheumatologist, did not like side effects with medication given, trying supplements to help with symptoms Advised her to follow up with rheumatologist   5. Sleep disturbance Occasional will have trouble with sleep and she will take a xanax for this,  rarely uses  6. Calf pain -was seen 2 months ago due to calf pain by Dr Doug Sou, [place] ordered but patient never obtained, also has varicose veins which could be causing the pain  Follow up in 1 month

## 2014-12-02 NOTE — Patient Instructions (Signed)
May use biofreeze to legs, increase exercise and stretches  Bring in living will, POA paper work etc  Follow up in 4-6 weeks with fasting lab works before for physical

## 2014-12-05 ENCOUNTER — Other Ambulatory Visit: Payer: Self-pay

## 2014-12-09 ENCOUNTER — Telehealth: Payer: Self-pay

## 2014-12-09 NOTE — Telephone Encounter (Signed)
-----   Message from Lauree Chandler, NP sent at 12/04/2014  9:48 PM EDT ----- After reviewing epic pt has had an [place] of her right leg ordered to rule out DVT but it does not look like it was ever obtained, can you call her and see if this was set up?

## 2014-12-09 NOTE — Telephone Encounter (Signed)
Left message on voicemail for patient to return call when available   

## 2014-12-19 NOTE — Telephone Encounter (Signed)
Left message on voicemail for patient to return call when available   

## 2014-12-22 ENCOUNTER — Telehealth: Payer: Self-pay | Admitting: *Deleted

## 2014-12-22 NOTE — Telephone Encounter (Signed)
Spoke with patient regarding her U/S appointment scheduled by Dr. Doug Sou. Patient stated that she was feeling much better, when she received the call and cancelled that appointment. She stated that if she has anymore problems she will let us know.

## 2014-12-24 ENCOUNTER — Ambulatory Visit (INDEPENDENT_AMBULATORY_CARE_PROVIDER_SITE_OTHER): Payer: Medicare Other | Admitting: Podiatry

## 2014-12-24 ENCOUNTER — Ambulatory Visit (INDEPENDENT_AMBULATORY_CARE_PROVIDER_SITE_OTHER): Payer: Medicare Other

## 2014-12-24 ENCOUNTER — Encounter: Payer: Self-pay | Admitting: Podiatry

## 2014-12-24 VITALS — BP 108/62 | HR 85 | Resp 15

## 2014-12-24 DIAGNOSIS — I25110 Atherosclerotic heart disease of native coronary artery with unstable angina pectoris: Secondary | ICD-10-CM | POA: Diagnosis not present

## 2014-12-24 DIAGNOSIS — M205X2 Other deformities of toe(s) (acquired), left foot: Secondary | ICD-10-CM

## 2014-12-24 DIAGNOSIS — M7130 Other bursal cyst, unspecified site: Secondary | ICD-10-CM | POA: Diagnosis not present

## 2014-12-24 DIAGNOSIS — M79672 Pain in left foot: Secondary | ICD-10-CM

## 2014-12-24 NOTE — Progress Notes (Signed)
   Subjective:    Patient ID: [name] [name], female    DOB: [birth year], 81 y.o.   MRN: [id number]  HPI [name] [name] presents with painful knot/cyst on her left foot, she noticed it about 1 month prior and it has recently become increasingly painful. She believes it started after wearing a pair of tight shoes. Shortly after, she developed pain to the area. She has the pain to the area with certain shoe gear and pressure. She denies any redness over the area. No other complaints at this time. She said no prior treatment.   Review of Systems  HENT: Positive for rhinorrhea.   Neurological: Positive for headaches.  All other systems reviewed and are negative.      Objective:   Physical Exam AAO x3, NAD DP/PT pulses palpable bilaterally, CRT less than 3 seconds Protective sensation intact with Simms Weinstein monofilament, vibratory sensation intact, Achilles tendon reflex intact There is tenderness to palpation overlying the lateral aspect of the left fifth metatarsal head. There is a tailor's bunion deformity present. Over on the lateral aspect of the metatarsal head there is a small fluid-filled bursa overlying the area. There is no associated erythema her increase in warmth. There is no open lesions or pre-ulcerative lesions bilaterally.  No other areas of tenderness to bilateral lower extremities. MMT 5/5, ROM WNL.  No overlying edema, erythema, increase in warmth to bilateral lower extremities.  No pain with calf compression, swelling, warmth, erythema bilaterally.     Assessment & Plan:  81 year old female left foot tailor's bunion deformity, bursa over the area -X-rays were obtained and reviewed with the patient.  -Treatment options discussed including all alternatives, risks, and complications -Both conservative and surgical treatment options were discussed the patient. -At that time she wishes to proceed with conservative treatment. I discussed with her shoe gear modifications, padding,  offloading. Also discussed steroid injection area. She would proceed with a steroid injection. Risks and complications of injection were discussed with her for which she understands and verbally consents. Under sterile conditions a total 1 mL mixture of dexamethasone phosphate and 2% lidocaine plain was infiltrated into the symptomatic area on the lateral aspect of the fifth metatarsal head over the bursa. Band-Aid was applied. Patient tolerated the injection well any complicaitons. Post injection care was discussed the patient. -Follow-up visit symptoms do not resolve 3-4 weeks or sooner if any problems are to arise. In the meantime I encouraged her to call the office with any questions, concerns, change in symptoms

## 2015-01-01 ENCOUNTER — Other Ambulatory Visit: Payer: Medicare Other

## 2015-01-05 ENCOUNTER — Other Ambulatory Visit: Payer: Self-pay

## 2015-01-06 ENCOUNTER — Encounter: Payer: Medicare Other | Admitting: Nurse Practitioner

## 2015-01-23 ENCOUNTER — Telehealth: Payer: Self-pay | Admitting: Pulmonary Disease

## 2015-01-23 NOTE — Telephone Encounter (Signed)
Pt called in regards to a family member that is in the hospital wanting advice. Informed pt we couldn't be involved or discuss anything. Our providers are not seeing the pt. Informed her to speak with other family members about her concerns. Nothing further needed.

## 2015-02-06 ENCOUNTER — Telehealth: Payer: Self-pay

## 2015-02-06 NOTE — Telephone Encounter (Signed)
-----   Message from Lauree Chandler, NP sent at 02/06/2015 10:18 AM EDT ----- Does this pt plan to come back to see [place]? No uncoming appt and never came for lab work, was supposed to follow up in 1 month for a physical   ----- Message -----    From: SYSTEM    Sent: 02/06/2015  12:05 AM      To: Lauree Chandler, NP

## 2015-02-06 NOTE — Telephone Encounter (Signed)
Left message on voicemail for patient to return call when available   

## 2015-02-09 NOTE — Telephone Encounter (Signed)
Patient has a pending appointment with Velora Heckler doctor (see appointment desk)

## 2015-02-10 ENCOUNTER — Ambulatory Visit (INDEPENDENT_AMBULATORY_CARE_PROVIDER_SITE_OTHER): Payer: Medicare Other | Admitting: Internal Medicine

## 2015-02-10 ENCOUNTER — Encounter: Payer: Self-pay | Admitting: Internal Medicine

## 2015-02-10 VITALS — BP 114/78 | HR 82 | Temp 97.8°F | Resp 18 | Wt 140.0 lb

## 2015-02-10 DIAGNOSIS — I25110 Atherosclerotic heart disease of native coronary artery with unstable angina pectoris: Secondary | ICD-10-CM

## 2015-02-10 DIAGNOSIS — J209 Acute bronchitis, unspecified: Secondary | ICD-10-CM | POA: Diagnosis not present

## 2015-02-10 MED ORDER — AZITHROMYCIN 250 MG PO TABS: ORAL_TABLET | ORAL | Status: DC

## 2015-02-10 MED ORDER — HYDROCODONE-HOMATROPINE 5-1.5 MG/5ML PO SYRP: 5.0000 mL | ORAL_SOLUTION | Freq: Four times a day (QID) | ORAL | Status: DC | PRN

## 2015-02-10 NOTE — Patient Instructions (Signed)

## 2015-02-10 NOTE — Progress Notes (Signed)
   Subjective:    Patient ID: [name] [name], female    DOB: 1930-08-13, 81 y.o.   MRN: [id number]  HPI Her symptoms began last week as a sore throat. She used lozenges and gargled with benefit. The symptoms have recurred and are more severe. She has pressure discomfort in the maxillary sinuses. She is producing yellow sputum. She has chronic shortness of breath related to pulmonary fibrosis.  She has rheumatoid arthritis and is on a burst of steroids at this time   Review of Systems Frontal headache,nasal purulence,  otic pain or otic discharge denied. No fever , chills or sweats.  She denies itchy, watery eyes. There is no associated wheezing      Objective:   Physical Exam  General appearance:Adequately nourished; no acute distress or increased work of breathing is present.    Lymphatic: No  lymphadenopathy about the head, neck, or axilla .  Eyes: No conjunctival inflammation or lid edema is present. There is no scleral icterus.  Ears:  External ear exam shows no significant lesions or deformities.  Otoscopic examination reveals clear canals, tympanic membranes are intact bilaterally without bulging, retraction, inflammation or discharge.  Nose:  External nasal examination shows no deformity or inflammation. Nasal mucosa are erythematous, especially the right septum but without lesions or exudates No septal dislocation or deviation.No obstruction to airflow.   Oral exam: Dentures present; lips and gums are healthy appearing.There is no oropharyngeal erythema or exudate.S/P uvulectomy .  Neck:  No deformities, thyromegaly, masses, or tenderness noted.   Supple with full range of motion without pain.   Heart:  Normal rate and regular rhythm. S1 and S2 normal without gallop, murmur, click, rub or other extra sounds.   Lungs: Diffuse, symmetric fine rales in all lung fields.  Extremities:  No cyanosis, edema, or clubbing  noted    Skin: Warm & dry w/o tenting or jaundice. No  significant lesions or rash.        Assessment & Plan:  #1 acute bronchitis  #2 pulmonary fibrosis  #3 rheumatoid arthritis  Plan: See orders and recommendations

## 2015-02-10 NOTE — Progress Notes (Signed)
Pre visit review using our clinic review tool, if applicable. No additional management support is needed unless otherwise documented below in the visit note. 

## 2015-02-23 ENCOUNTER — Ambulatory Visit (INDEPENDENT_AMBULATORY_CARE_PROVIDER_SITE_OTHER): Payer: Medicare Other | Admitting: Internal Medicine

## 2015-02-23 ENCOUNTER — Encounter: Payer: Self-pay | Admitting: Internal Medicine

## 2015-02-23 VITALS — BP 106/70 | HR 98 | Temp 98.7°F | Resp 22 | Ht 64.5 in | Wt 139.0 lb

## 2015-02-23 DIAGNOSIS — Z86718 Personal history of other venous thrombosis and embolism: Secondary | ICD-10-CM

## 2015-02-23 DIAGNOSIS — I25110 Atherosclerotic heart disease of native coronary artery with unstable angina pectoris: Secondary | ICD-10-CM | POA: Diagnosis not present

## 2015-02-23 DIAGNOSIS — R05 Cough: Secondary | ICD-10-CM

## 2015-02-23 DIAGNOSIS — J209 Acute bronchitis, unspecified: Secondary | ICD-10-CM

## 2015-02-23 DIAGNOSIS — J8410 Pulmonary fibrosis, unspecified: Secondary | ICD-10-CM

## 2015-02-23 DIAGNOSIS — R059 Cough, unspecified: Secondary | ICD-10-CM

## 2015-02-23 MED ORDER — HYDROCODONE-HOMATROPINE 5-1.5 MG/5ML PO SYRP: 5.0000 mL | ORAL_SOLUTION | Freq: Four times a day (QID) | ORAL | Status: DC | PRN

## 2015-02-23 MED ORDER — PREDNISONE 20 MG PO TABS: 20.0000 mg | ORAL_TABLET | Freq: Two times a day (BID) | ORAL | Status: DC

## 2015-02-23 NOTE — Progress Notes (Signed)
Subjective:    Patient ID: [name] [name], female    DOB: Apr 03, 1931, 81 y.o.   MRN: [id number]  HPI  81 year old patient who has a history of interstitial lung disease secondary to rheumatoid arthritis.  She has been treated with immunotherapy in the past.  She was treated for acute bronchitis on August 2.  She seemed to improve but 3 days ago developed the minimal cough, which has been largely nonproductive.  She has had some mild dyspnea but no recurrent sore throat.  No documented fever.  Her chief complaint seems to be neck and upper shoulder pain.  This was also a prominent symptom.  2 weeks ago She does have a history of recurrent pulmonary emboli, but unclear why she is not on chronic anticoagulation.  She has been treated with Coumadin in the past  Past Medical History  Diagnosis Date  . Allergic rhinitis   . History of pulmonary embolism   . DVT (deep venous thrombosis)   . Dyslipidemia   . Pulmonary fibrosis   . Osteoporosis   . CAD (coronary artery disease)     Mild CAD by cath 2008  . Hyperlipidemia   . DJD (degenerative joint disease)     rheumatoid  . Rheumatoid arthritis   . Insomnia   . Allergy     Social History   Social History  . Marital Status: Widowed    Spouse Name: N/A  . Number of Children: 2  . Years of Education: N/A   Occupational History  . government Designer, television/film set     retired   Social History Main Topics  . Smoking status: Former Smoker -- 0.30 packs/day for 20 years    Types: Cigarettes    Quit date: 07/11/1973  . Smokeless tobacco: Never Used  . Alcohol Use: 0.0 oz/week     Comment: approx 1 glass wine a month  . Drug Use: No  . Sexual Activity: No   Other Topics Concern  . Not on file   Social History Narrative   Diet:   Do you drink/eat things with caffeine? Yes   Marital status:        Widowed                      What year were you married?1954   Do you live in a house, apartment, assisted living, condo, trailer, etc)?  House   Is it one or more stories? 1 1/4   How many persons live in your home?1   Do you have any pets in your home? No   Current or past profession:  Publishing rights manager   Do you exercise?        Yes                                            Type & how often:  Walk                                    Do you have a living will? Yes   Do you have a DNR Form? Yes   Do you have a POA/HPOA forms?  Yes       Past Surgical History  Procedure Laterality Date  . Total knee arthroplasty  1997, 2007  Bilateral  . Tonsillectomy  1941, 1951  . Left heart catheterization with coronary angiogram N/A 06/18/2013    Procedure: LEFT HEART CATHETERIZATION WITH CORONARY ANGIOGRAM;  Surgeon: Dashanique Brownstein M Martin[place], MD;  Location: [facility] CATH LAB;  Service: Cardiovascular;  Laterality: N/A;  . Cataract extraction    . Abdominal hysterectomy  1974  . Appendectomy  1959    Family History  Problem Relation Age of Onset  . Kidney disease Daughter 5  . Cancer Daughter   . Heart attack Father 52  . Alzheimer's disease Sister   . Heart disease Sister   . Alzheimer's disease Sister     Allergies  Allergen Reactions  . Crestor [Rosuvastatin] Other (See Comments)    Muscle pain  . Lactose Intolerance (Gi) Other (See Comments)    Stomach upset  . Penicillins Hives  . Niacin Hives    Current Outpatient Prescriptions on File Prior to Visit  Medication Sig Dispense Refill  . ALPRAZolam (XANAX) 0.5 MG tablet Take 0.5 tablets (0.25 mg total) by mouth as needed for anxiety. (Patient taking differently: Take 0.25 mg by mouth daily as needed for anxiety. ) 30 tablet 0  . cetirizine (ZYRTEC) 10 MG tablet Take 5-10 mg by mouth daily as needed for allergies.    . fluticasone (FLONASE) 50 MCG/ACT nasal spray Place 2 sprays into both nostrils daily as needed for allergies or rhinitis.    [name] latanoprost (XALATAN) 0.005 % ophthalmic solution Place 1 drop into both eyes at bedtime.   0  . Polyethyl Glycol-Propyl  Glycol (SYSTANE) 0.4-0.3 % SOLN Apply 1 drop to eye daily as needed (for dry eyes).    [name] HYDROcodone-homatropine (HYDROMET) 5-1.5 MG/5ML syrup Take 5 mLs by mouth every 6 (six) hours as needed for cough. (Patient not taking: Reported on 02/23/2015) 120 mL 0   No current facility-administered medications on file prior to visit.    BP 106/70 mmHg  Pulse 98  Temp(Src) 98.7 F (37.1 C) (Oral)  Resp 22  Ht 5' 4.5" (1.638 m)  Wt 139 lb (63.05 kg)  BMI 23.50 kg/m2  SpO2 98%     Review of Systems  Constitutional: Negative.   HENT: Negative for congestion, dental problem, hearing loss, rhinorrhea, sinus pressure, sore throat and tinnitus.   Eyes: Negative for pain, discharge and visual disturbance.  Respiratory: Positive for cough. Negative for shortness of breath and wheezing.   Cardiovascular: Negative for chest pain, palpitations and leg swelling.  Gastrointestinal: Negative for nausea, vomiting, abdominal pain, diarrhea, constipation, blood in stool and abdominal distention.  Genitourinary: Negative for dysuria, urgency, frequency, hematuria, flank pain, vaginal bleeding, vaginal discharge, difficulty urinating, vaginal pain and pelvic pain.  Musculoskeletal: Positive for arthralgias, neck pain and neck stiffness. Negative for joint swelling and gait problem.  Skin: Negative for rash.  Neurological: Negative for dizziness, syncope, speech difficulty, weakness, numbness and headaches.  Hematological: Negative for adenopathy.  Psychiatric/Behavioral: Negative for behavioral problems, dysphoric mood and agitation. The patient is not nervous/anxious.        Objective:   Physical Exam  Constitutional: She is oriented to person, place, and time. She appears well-developed and well-nourished.  Afebrile.  No distress  HENT:  Head: Normocephalic.  Right Ear: External ear normal.  Left Ear: External ear normal.  Mouth/Throat: Oropharynx is clear and moist.  Wax right canal  Eyes:  Conjunctivae and EOM are normal. Pupils are equal, round, and reactive to light.  Neck: Normal range of motion. Neck supple. No thyromegaly present.  Cardiovascular: Normal rate, regular rhythm and normal heart sounds.   Pulmonary/Chest: Effort normal.  Bilateral rales, right side greater than left  Abdominal: Soft. Bowel sounds are normal. She exhibits no mass. There is no tenderness.  Musculoskeletal: Normal range of motion.  Lymphadenopathy:    She has no cervical adenopathy.  Neurological: She is alert and oriented to person, place, and time.  Skin: Skin is warm and dry. No rash noted.  Psychiatric: She has a normal mood and affect. Her behavior is normal.          Assessment & Plan:   Interstitial lung disease Recurrent cough.  Possibly mild viral URI.  Patient has been treated for reflux in the past.  Will treat symptomatically Neck and shoulder pain.  Will treat with  short course of oral prednisone

## 2015-02-23 NOTE — Patient Instructions (Signed)
Take medications as directed  Acute bronchitis symptoms for less than 10 days are generally not helped by antibiotics.  Take over-the-counter expectorants and cough medications such as  Mucinex DM.  Call if there is no improvement in 5 to 7 days or if  you develop worsening cough, fever, or new symptoms, such as shortness of breath or chest pain.  Follow with your primary care provider as well as rheumatology

## 2015-02-23 NOTE — Progress Notes (Signed)
Pre visit review using our clinic review tool, if applicable. No additional management support is needed unless otherwise documented below in the visit note. 

## 2015-02-24 DIAGNOSIS — M9902 Segmental and somatic dysfunction of thoracic region: Secondary | ICD-10-CM | POA: Diagnosis not present

## 2015-02-24 DIAGNOSIS — M542 Cervicalgia: Secondary | ICD-10-CM | POA: Diagnosis not present

## 2015-02-24 DIAGNOSIS — M9901 Segmental and somatic dysfunction of cervical region: Secondary | ICD-10-CM | POA: Diagnosis not present

## 2015-02-24 DIAGNOSIS — M5031 Other cervical disc degeneration,  high cervical region: Secondary | ICD-10-CM | POA: Diagnosis not present

## 2015-03-03 NOTE — Telephone Encounter (Signed)
Patient called back and stated that she is not planning on returning to our office.

## 2015-03-05 ENCOUNTER — Ambulatory Visit (INDEPENDENT_AMBULATORY_CARE_PROVIDER_SITE_OTHER): Payer: Medicare Other | Admitting: Family Medicine

## 2015-03-05 VITALS — BP 114/68 | HR 93 | Temp 97.9°F | Resp 18 | Ht 63.0 in | Wt 140.6 lb

## 2015-03-05 DIAGNOSIS — W57XXXA Bitten or stung by nonvenomous insect and other nonvenomous arthropods, initial encounter: Secondary | ICD-10-CM

## 2015-03-05 DIAGNOSIS — I25110 Atherosclerotic heart disease of native coronary artery with unstable angina pectoris: Secondary | ICD-10-CM | POA: Diagnosis not present

## 2015-03-05 DIAGNOSIS — S80862A Insect bite (nonvenomous), left lower leg, initial encounter: Secondary | ICD-10-CM

## 2015-03-05 NOTE — Progress Notes (Signed)
This is an 81 year old woman who lives alone in her home. She comes in for evaluation of what she thinks may be a bite on her left calf. She was in her usual state of health, just having resolved a case of bronchitis (patient has pulmonary fibrosis) when she noticed itching on her back on Tuesday night. She took Benadryl and applied coordinated to her back to control the itching. She felt somewhat drugged yesterday and itching was gradually subsiding. She still feels a little woozy today but overall better. This morning she noted 2 puncture marks on her left calf and was worried she might of been bit by a snake. She has no pain there in the itching is almost completely resolved.  Patient has no recollection of any kind of bite.  Patient does not work outside in the garden and has not been lying in the grass or otherwise exposed to snakes.  Objective: No acute distress BP 114/68 mmHg  Pulse 93  Temp(Src) 97.9 F (36.6 C) (Oral)  Resp 18  Ht 5\' 3"  (1.6 m)  Wt 140 lb 9.6 oz (63.776 kg)  BMI 24.91 kg/m2  SpO2 96% HEENT: Unremarkable Chest: No distress Examination of her left calf reveals 2x1 mm puncture sites with almost no erythema, no swelling, no tenderness. These 2 puncture sites are 1 cm apart. There is no calf tenderness or swelling and no red streaking.  Assessment: Insect bites.  Plan: Reassurance since there is no evidence of cellulitis or infection.  Signed, Carola Frost.D.

## 2015-03-17 ENCOUNTER — Ambulatory Visit (INDEPENDENT_AMBULATORY_CARE_PROVIDER_SITE_OTHER): Payer: Medicare Other | Admitting: Internal Medicine

## 2015-03-17 ENCOUNTER — Ambulatory Visit (INDEPENDENT_AMBULATORY_CARE_PROVIDER_SITE_OTHER)
Admission: RE | Admit: 2015-03-17 | Discharge: 2015-03-17 | Disposition: A | Discharge status: Home / Self Care | Payer: Medicare Other | Source: Ambulatory Visit | Attending: Internal Medicine | Admitting: Internal Medicine

## 2015-03-17 ENCOUNTER — Other Ambulatory Visit (INDEPENDENT_AMBULATORY_CARE_PROVIDER_SITE_OTHER): Payer: Medicare Other

## 2015-03-17 ENCOUNTER — Encounter: Payer: Self-pay | Admitting: Internal Medicine

## 2015-03-17 VITALS — BP 110/68 | HR 92 | Temp 98.3°F | Resp 18 | Ht 64.5 in | Wt 139.0 lb

## 2015-03-17 DIAGNOSIS — I25110 Atherosclerotic heart disease of native coronary artery with unstable angina pectoris: Secondary | ICD-10-CM | POA: Diagnosis not present

## 2015-03-17 DIAGNOSIS — J8489 Other specified interstitial pulmonary diseases: Secondary | ICD-10-CM | POA: Diagnosis not present

## 2015-03-17 DIAGNOSIS — IMO0001 Reserved for inherently not codable concepts without codable children: Secondary | ICD-10-CM

## 2015-03-17 DIAGNOSIS — I8390 Asymptomatic varicose veins of unspecified lower extremity: Secondary | ICD-10-CM

## 2015-03-17 DIAGNOSIS — R05 Cough: Secondary | ICD-10-CM | POA: Diagnosis not present

## 2015-03-17 DIAGNOSIS — R059 Cough, unspecified: Secondary | ICD-10-CM

## 2015-03-17 DIAGNOSIS — J449 Chronic obstructive pulmonary disease, unspecified: Secondary | ICD-10-CM | POA: Diagnosis not present

## 2015-03-17 DIAGNOSIS — R350 Frequency of micturition: Secondary | ICD-10-CM

## 2015-03-17 DIAGNOSIS — R079 Chest pain, unspecified: Secondary | ICD-10-CM | POA: Diagnosis not present

## 2015-03-17 DIAGNOSIS — I868 Varicose veins of other specified sites: Secondary | ICD-10-CM

## 2015-03-17 LAB — URINALYSIS, ROUTINE W REFLEX MICROSCOPIC
Bilirubin Urine: NEGATIVE
Ketones, ur: NEGATIVE
Leukocytes, UA: NEGATIVE
Nitrite: NEGATIVE
SPECIFIC GRAVITY, URINE: 1.02 (ref 1.000–1.030)
URINE GLUCOSE: NEGATIVE
Urobilinogen, UA: 0.2 (ref 0.0–1.0)
WBC, UA: NONE SEEN (ref 0–?)
pH: 7 (ref 5.0–8.0)

## 2015-03-17 LAB — COMPREHENSIVE METABOLIC PANEL
ALT: 15 U/L (ref 0–35)
AST: 20 U/L (ref 0–37)
Albumin: 3.7 g/dL (ref 3.5–5.2)
Alkaline Phosphatase: 83 U/L (ref 39–117)
BILIRUBIN TOTAL: 0.6 mg/dL (ref 0.2–1.2)
BUN: 7 mg/dL (ref 6–23)
CALCIUM: 9.7 mg/dL (ref 8.4–10.5)
CHLORIDE: 104 meq/L (ref 96–112)
CO2: 30 meq/L (ref 19–32)
CREATININE: 0.8 mg/dL (ref 0.40–1.20)
GFR: 88.22 mL/min (ref >60.00)
GLUCOSE: 90 mg/dL (ref 70–99)
Potassium: 4.6 mEq/L (ref 3.5–5.1)
SODIUM: 139 meq/L (ref 135–145)
Total Protein: 7.6 g/dL (ref 6.0–8.3)

## 2015-03-17 LAB — CBC
HEMATOCRIT: 43.8 % (ref 36.0–46.0)
Hemoglobin: 14.5 g/dL (ref 12.0–15.0)
MCHC: 33.2 g/dL (ref 30.0–36.0)
MCV: 89 fl (ref 78.0–100.0)
Platelets: 221 10*3/uL (ref 150.0–400.0)
RBC: 4.91 Mil/uL (ref 3.87–5.11)
RDW: 13.8 % (ref 11.5–15.5)
WBC: 10.4 10*3/uL (ref 4.0–10.5)

## 2015-03-17 LAB — D-DIMER, QUANTITATIVE (NOT AT ARMC): D DIMER QUANT: 7.57 ug{FEU}/mL — AB (ref 0.00–0.48)

## 2015-03-17 MED ORDER — FLUTICASONE PROPIONATE 50 MCG/ACT NA SUSP: 2.0000 | Freq: Every day | NASAL | Status: DC | PRN

## 2015-03-17 NOTE — Patient Instructions (Signed)
We will check the urine for infection today. We will also check a chest x-ray of your lungs and want you to call the lung doctor for an appointment.   We are also checking the blood work today.   Come back in 1 month for a check up of the lungs. We will call you and let you know if we need to change the plan but start taking the flonase to help with the cough.   The cough can be coming from nasal drainage or the acid reflux. If we can get rid of the nose drainage with flonase then we can then treat the acid reflux if you are still coughing.

## 2015-03-18 ENCOUNTER — Telehealth: Payer: Self-pay

## 2015-03-18 DIAGNOSIS — R0789 Other chest pain: Secondary | ICD-10-CM

## 2015-03-18 NOTE — Telephone Encounter (Signed)
Patient advised that referral for CT of lungs has been placed by dr Doug Sou, that office will call patient

## 2015-03-18 NOTE — Telephone Encounter (Signed)
Please call and let her know that we did not find anything new on the chest x-ray but need to check with a CT of her lungs for any new blood clots that could be present.

## 2015-03-18 NOTE — Telephone Encounter (Signed)
Recd telephone call from [facility] reporting D-dimer lab value alert of 7.57---i repeated back for verification--[facility] will also fax lab results to me---[facility] phone [phone] kollar advised

## 2015-03-19 NOTE — Assessment & Plan Note (Signed)
Chest pain today on deep inhalation and concern exists for DVT/PE since she never went for the [place] after last visit. Will check d-dimer (suspect it will be elevated with age and prior history) and likely CT chest to rule out PE. Checking CMP to confirm kidney function. Does not sound cardiac and does not come with exertion.

## 2015-03-19 NOTE — Assessment & Plan Note (Signed)
She does have history of some pulmonary fibrosis. It is possible that this cough represents worsening of that. Checking CXR today for stability from CXR taken in January. Lungs fairly clear on exam making pneumonia very unlikely. Will not rx antibiotics at this time as non-infectious etiology is most likely.

## 2015-03-19 NOTE — Progress Notes (Signed)
   Subjective:    Patient ID: [name] [name], female    DOB: 21-Apr-1931, 81 y.o.   MRN: [id number]  HPI The patient is an 81 YO female coming in for cough. She has been struggling with this cough on and off for several months. She is getting some tightness with breathing in the last 1-2 weeks. No SOB. No chest pain other than the tightness. No fevers or chills. Has been treated several times for bronchitis with antibiotics without significant change. Taking her flonase and zyrtec and feels that her nasal symptoms are fairly well controlled. Does have personal history of DVT and is not on anticoagulation due to unknown reasons (have not gotten records and she does not know but knows she was taken off). She was seen by me several months ago with leg swelling. I recommended checking for DVT but she did not feel this was necessary and did not go for this test. The swelling is since gone but took weeks to go away. She has tried several things over the counter for the cough which have not helped significantly.   Review of Systems  Constitutional: Positive for activity change. Negative for fever, chills, appetite change, fatigue and unexpected weight change.  HENT: Negative.   Eyes: Negative.   Respiratory: Positive for cough, chest tightness and shortness of breath. Negative for wheezing.        With exertion, worsening  Cardiovascular: Negative for chest pain, palpitations and leg swelling.  Gastrointestinal: Negative for nausea, abdominal pain, diarrhea, constipation and abdominal distention.  Musculoskeletal: Positive for myalgias, back pain and arthralgias. Negative for gait problem.  Neurological: Negative.   Psychiatric/Behavioral: Negative.       Objective:   Physical Exam  Constitutional: She is oriented to person, place, and time. She appears well-developed and well-nourished.  Slightly frail appearing  HENT:  Head: Normocephalic and atraumatic.  Eyes: EOM are normal.  Neck: Normal range  of motion.  Cardiovascular: Normal rate and regular rhythm.   Pulmonary/Chest: Effort normal. No respiratory distress. She has wheezes. She has no rales.  Wheezing improved from prior and minimal, some pain with deep inhalation  Abdominal: Soft. Bowel sounds are normal.  Neurological: She is alert and oriented to person, place, and time. Coordination normal.  Skin: Skin is warm and dry.  Psychiatric: She has a normal mood and affect.   Filed Vitals:   03/17/15 1004  BP: 110/68  Pulse: 92  Temp: 98.3 F (36.8 C)  TempSrc: Oral  Resp: 18  Height: 5' 4.5" (1.638 m)  Weight: 139 lb (63.05 kg)  SpO2: 90%      Assessment & Plan:

## 2015-03-19 NOTE — Assessment & Plan Note (Signed)
There is still concern that this represented DVT and she never got the ultrasound that was recommended. Although this is resolved currently concern now for the new lung symptoms.

## 2015-03-20 ENCOUNTER — Other Ambulatory Visit: Payer: Self-pay

## 2015-03-20 DIAGNOSIS — Z1231 Encounter for screening mammogram for malignant neoplasm of breast: Secondary | ICD-10-CM

## 2015-03-25 ENCOUNTER — Ambulatory Visit (INDEPENDENT_AMBULATORY_CARE_PROVIDER_SITE_OTHER)
Admission: RE | Admit: 2015-03-25 | Discharge: 2015-03-25 | Disposition: A | Discharge status: Home / Self Care | Payer: Medicare Other | Source: Ambulatory Visit | Attending: Internal Medicine | Admitting: Internal Medicine

## 2015-03-25 DIAGNOSIS — R0789 Other chest pain: Secondary | ICD-10-CM

## 2015-03-25 MED ORDER — IOHEXOL 350 MG/ML SOLN
80.0000 mL | Freq: Once | INTRAVENOUS | Status: AC | PRN
  Administered 2015-03-25: 70 mL via INTRAVENOUS

## 2015-03-30 ENCOUNTER — Telehealth: Payer: Self-pay | Admitting: Internal Medicine

## 2015-03-30 NOTE — Telephone Encounter (Signed)
Would need to see her as we have never treated her for this in the past.

## 2015-03-30 NOTE — Telephone Encounter (Signed)
Patient called to advise that her R.A. Pain has returned severely . She states that she has been given a pill for pain in the past. i am unable to find anything on this. She denied that the voltatren gel is what she had. She states it was a pill.

## 2015-03-31 ENCOUNTER — Ambulatory Visit (INDEPENDENT_AMBULATORY_CARE_PROVIDER_SITE_OTHER): Payer: Medicare Other | Admitting: Internal Medicine

## 2015-03-31 ENCOUNTER — Encounter: Payer: Self-pay | Admitting: Internal Medicine

## 2015-03-31 VITALS — BP 140/82 | HR 110 | Wt 136.0 lb

## 2015-03-31 DIAGNOSIS — I25110 Atherosclerotic heart disease of native coronary artery with unstable angina pectoris: Secondary | ICD-10-CM

## 2015-03-31 DIAGNOSIS — M069 Rheumatoid arthritis, unspecified: Secondary | ICD-10-CM | POA: Diagnosis not present

## 2015-03-31 DIAGNOSIS — M2550 Pain in unspecified joint: Secondary | ICD-10-CM | POA: Diagnosis not present

## 2015-03-31 MED ORDER — VITAMIN D3 50 MCG (2000 UT) PO CAPS: 2000.0000 [IU] | ORAL_CAPSULE | Freq: Every day | ORAL | Status: DC

## 2015-03-31 MED ORDER — METHYLPREDNISOLONE ACETATE 80 MG/ML IJ SUSP
80.0000 mg | Freq: Once | INTRAMUSCULAR | Status: AC
  Administered 2015-03-31: 80 mg via INTRAMUSCULAR

## 2015-03-31 MED ORDER — PREDNISONE 10 MG PO TABS: ORAL_TABLET | ORAL | Status: DC

## 2015-03-31 MED ORDER — TRAMADOL HCL 50 MG PO TABS: 25.0000 mg | ORAL_TABLET | Freq: Four times a day (QID) | ORAL | Status: DC | PRN

## 2015-03-31 NOTE — Assessment & Plan Note (Addendum)
Tramadol prn  Potential benefits of a long term opioids use as well as potential risks (i.e. addiction risk, apnea etc) and complications (i.e. Somnolence, constipation and others) were explained to the patient and were aknowledged. Depomedrol 80 mg IM Vit D

## 2015-03-31 NOTE — Progress Notes (Signed)
Subjective:  Patient ID: [name] [name], female    DOB: Jul 25, 1930  Age: 81 y.o. MRN: [id number]  CC: No chief complaint on file.   HPI   [name] [name] presents for RA. C/o pain "from head to toe". She was on Prednisone for PF - pains were better - stopped 1 mo ago - ran out.  Pain is 10/10.   Outpatient Prescriptions Prior to Visit  Medication Sig Dispense Refill  . ALPRAZolam (XANAX) 0.5 MG tablet Take 0.5 tablets (0.25 mg total) by mouth as needed for anxiety. (Patient taking differently: Take 0.25 mg by mouth daily as needed for anxiety. ) 30 tablet 0  . cetirizine (ZYRTEC) 10 MG tablet Take 5-10 mg by mouth daily as needed for allergies.    . fluticasone (FLONASE) 50 MCG/ACT nasal spray Place 2 sprays into both nostrils daily as needed for allergies or rhinitis. 16 g 3  . latanoprost (XALATAN) 0.005 % ophthalmic solution Place 1 drop into both eyes at bedtime.   0  . Polyethyl Glycol-Propyl Glycol (SYSTANE) 0.4-0.3 % SOLN Apply 1 drop to eye daily as needed (for dry eyes).     No facility-administered medications prior to visit.    ROS Review of Systems  Constitutional: Positive for fatigue. Negative for chills, activity change, appetite change and unexpected weight change.  HENT: Negative for congestion, mouth sores and sinus pressure.   Eyes: Negative for visual disturbance.  Respiratory: Negative for cough and chest tightness.   Gastrointestinal: Negative for nausea and abdominal pain.  Genitourinary: Negative for frequency, difficulty urinating and vaginal pain.  Musculoskeletal: Positive for back pain, joint swelling, arthralgias and neck stiffness. Negative for gait problem.  Skin: Negative for pallor and rash.  Neurological: Negative for dizziness, tremors, weakness, numbness and headaches.  Psychiatric/Behavioral: Negative for suicidal ideas, confusion and sleep disturbance. The patient is not nervous/anxious.     Objective:  BP 140/82 mmHg  Pulse 110  Wt 136 lb  (61.689 kg)  SpO2 93%  BP Readings from Last 3 Encounters:  03/31/15 140/82  03/17/15 110/68  03/05/15 114/68    Wt Readings from Last 3 Encounters:  03/31/15 136 lb (61.689 kg)  03/17/15 139 lb (63.05 kg)  03/05/15 140 lb 9.6 oz (63.776 kg)    Physical Exam  Constitutional: She appears well-developed. No distress.  HENT:  Head: Normocephalic.  Right Ear: External ear normal.  Left Ear: External ear normal.  Nose: Nose normal.  Mouth/Throat: Oropharynx is clear and moist.  Eyes: Conjunctivae are normal. Pupils are equal, round, and reactive to light. Right eye exhibits no discharge. Left eye exhibits no discharge.  Neck: Normal range of motion. Neck supple. No JVD present. No tracheal deviation present. No thyromegaly present.  Cardiovascular: Normal rate, regular rhythm and normal heart sounds.   Pulmonary/Chest: No stridor. No respiratory distress. She has no wheezes.  Abdominal: Soft. Bowel sounds are normal. She exhibits no distension and no mass. There is no tenderness. There is no rebound and no guarding.  Musculoskeletal: She exhibits tenderness. She exhibits no edema.  Lymphadenopathy:    She has no cervical adenopathy.  Neurological: She displays normal reflexes. No cranial nerve deficit. She exhibits normal muscle tone. Coordination normal.  Skin: No rash noted. No erythema.  Psychiatric: She has a normal mood and affect. Her behavior is normal. Judgment and thought content normal.  B crackles Synovial tenderness around joints  Lab Results  Component Value Date   WBC 10.4 03/17/2015   HGB 14.5  03/17/2015   HCT 43.8 03/17/2015   PLT 221.0 03/17/2015   GLUCOSE 90 03/17/2015   CHOL 234* 09/11/2014   TRIG 185.0* 09/11/2014   HDL 41.70 09/11/2014   LDLCALC 155* 09/11/2014   ALT 15 03/17/2015   AST 20 03/17/2015   NA 139 03/17/2015   K 4.6 03/17/2015   CL 104 03/17/2015   CREATININE 0.80 03/17/2015   BUN 7 03/17/2015   CO2 30 03/17/2015   TSH 3.156  06/17/2013   INR 2.2 01/28/2014    Ct Angio Chest W/cm &/or Wo Cm  03/25/2015   CLINICAL DATA:  Chest tightness. History of PE not on anti coagulation.  EXAM: CT ANGIOGRAPHY CHEST WITH CONTRAST  TECHNIQUE: Multidetector CT imaging of the chest was performed using the standard protocol during bolus administration of intravenous contrast. Multiplanar CT image reconstructions and MIPs were obtained to evaluate the vascular anatomy.  CONTRAST:  68mL OMNIPAQUE IOHEXOL 350 MG/ML SOLN  COMPARISON:  12/31/2012  FINDINGS: THORACIC INLET/BODY WALL:  No acute abnormality.  MEDIASTINUM:  Normal heart size. No pericardial effusion. Atherosclerosis, including the proximal LAD. Mild hilar adenopathy, considered reactive to the chronic lung disease.  Right lower lobe segmental pulmonary artery webs which are stable and consistent with remote pulmonary embolism. No acute pulmonary embolism is seen. Attenuated appearance of lower lobe pulmonary arteries in the setting of extensive fibrosis and presumed shunting. No acute aortic finding.  LUNG WINDOWS:  Pulmonary fibrosis with low lung volumes and extensive basilar and subpleural predominant honeycombing. There has been no progression since 2014. No ground-glass opacity typical of active alveolitis. No superimposed pneumonia, effusion, or air leak.  UPPER ABDOMEN:  No acute findings.  OSSEOUS:  No acute fracture.  No suspicious lytic or blastic lesions.  Review of the MIP images confirms the above findings.  IMPRESSION: 1. Negative for acute pulmonary embolism. 2. 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 3. Pulmonary fibrosis with usual interstitial pneumonia pattern, stable from 2014.No acute superimposed pulmonary findings.   Electronically Signed   By: Monte Fantasia M.D.   On: 03/25/2015 08:56    Assessment & Plan:   There are no diagnoses linked to this encounter. I am having [name] [name] maintain her cetirizine, Polyethyl  Glycol-Propyl Glycol, latanoprost, ALPRAZolam, and fluticasone.  No orders of the defined types were placed in this encounter.     Follow-up: No Follow-up on file.  Walker Kehr, MD

## 2015-03-31 NOTE — Assessment & Plan Note (Signed)
Chronic  9/16 worse off steroids. Restart Deltasone at 10 mg/d  Potential benefits of a long term steroid  use as well as potential risks  and complications were explained to the patient and were aknowledged. Tramadol prn  Potential benefits of a long term opioids use as well as potential risks (i.e. addiction risk, apnea etc) and complications (i.e. Somnolence, constipation and others) were explained to the patient and were aknowledged.  Rhem appt - pt has one

## 2015-03-31 NOTE — Progress Notes (Signed)
Pre visit review using our clinic review tool, if applicable. No additional management support is needed unless otherwise documented below in the visit note. 

## 2015-04-02 ENCOUNTER — Telehealth: Payer: Self-pay | Admitting: *Deleted

## 2015-04-02 DIAGNOSIS — M25552 Pain in left hip: Secondary | ICD-10-CM

## 2015-04-02 NOTE — Telephone Encounter (Signed)
Receive call pt states she saw Dr. Alain Marion on 9/20 for her arthritis. Today her (L) leg is in a lot of pain,have some swelling, and she can't hardly walk. Currently taking prednisone that was rx but not helping. Pls advise what pt need to do...Johny Chess

## 2015-04-03 ENCOUNTER — Ambulatory Visit (INDEPENDENT_AMBULATORY_CARE_PROVIDER_SITE_OTHER)
Admission: RE | Admit: 2015-04-03 | Discharge: 2015-04-03 | Disposition: A | Discharge status: Home / Self Care | Payer: Medicare Other | Source: Ambulatory Visit | Attending: Internal Medicine | Admitting: Internal Medicine

## 2015-04-03 ENCOUNTER — Ambulatory Visit
Admission: RE | Admit: 2015-04-03 | Discharge: 2015-04-03 | Disposition: A | Discharge status: Home / Self Care | Payer: Medicare Other | Source: Ambulatory Visit

## 2015-04-03 DIAGNOSIS — M25552 Pain in left hip: Secondary | ICD-10-CM | POA: Diagnosis not present

## 2015-04-03 DIAGNOSIS — R1030 Lower abdominal pain, unspecified: Secondary | ICD-10-CM | POA: Diagnosis not present

## 2015-04-03 DIAGNOSIS — Z1231 Encounter for screening mammogram for malignant neoplasm of breast: Secondary | ICD-10-CM

## 2015-04-03 NOTE — Telephone Encounter (Signed)
To clarify, i spoke to the patient, and she stated that the pain is in the groin.

## 2015-04-03 NOTE — Telephone Encounter (Signed)
Is the pain in the foot? Thx

## 2015-04-03 NOTE — Telephone Encounter (Signed)
Called and advised.

## 2015-04-03 NOTE — Telephone Encounter (Signed)
L hip X ray ordered Thx

## 2015-04-09 ENCOUNTER — Ambulatory Visit (INDEPENDENT_AMBULATORY_CARE_PROVIDER_SITE_OTHER): Payer: Medicare Other | Admitting: Pulmonary Disease

## 2015-04-09 ENCOUNTER — Encounter: Payer: Self-pay | Admitting: Pulmonary Disease

## 2015-04-09 DIAGNOSIS — I25110 Atherosclerotic heart disease of native coronary artery with unstable angina pectoris: Secondary | ICD-10-CM

## 2015-04-09 DIAGNOSIS — J8410 Pulmonary fibrosis, unspecified: Secondary | ICD-10-CM | POA: Diagnosis not present

## 2015-04-09 MED ORDER — ENOXAPARIN SODIUM 30 MG/0.3ML ~~LOC~~ SOLN: 30.0000 mg | Freq: Two times a day (BID) | SUBCUTANEOUS | Status: DC

## 2015-04-09 NOTE — Progress Notes (Signed)
Subjective:    Patient ID: [name] [name], female    DOB: [birth year], 81 y.o.   MRN: [id number]  Synopsis: This is a very pleasant female who is diagnosed with interstitial lung disease in her mid 84s after evaluation at [facility] and [facility]. She was found to have a UIP pattern on CT scan and serology testing revealed a very high rheumatoid factor as well as anti-CCP antibody. She was followed by Dr. Elgie Collard at [facility] who felt like she had rheumatoid arthritis associated interstitial lung disease. He never treated her with immunosuppressive therapy. From 2000 07/09/2013 she's continued to follow in both [place] and due to her lung function has remained stable during that time. She has not developed new shortness of breath. She has had repeated episodes of pulmonary emboli. The most recent was in the summer of 2014 when she developed her fourth pulmonary embolism after returning from a trip to [place].   HPI Chief Complaint  Patient presents with  . Follow-up    Pt here for 6 month f/u. Pt did not have PFT. Pt states she had bronchitis in 02/2015. Pt states she does not feel quite back to baseline. Pt c/o DOE, mild prod cough with grey mucus, chest tightness when lying down.    [name] says that she recently traveled to [facility] for a conference.   She says that she has been struggling with arthritis this year and ahs seen several physicians for this in 2016. She has been treated with arthritis therapy which has been helpful for hand, wrist and neck.  She has been taking prednisone continuously recently.   She no longer sees Dr. Ouida Sills as he retired. She had a bout of bronchitis in August which worsened her breathing complaints. She has been breathing better since the bronchitis has passed. She had some swelling in her ankles.    Past Medical History  Diagnosis Date  . Allergic rhinitis   . History of pulmonary embolism   . DVT (deep  venous thrombosis)   . Dyslipidemia   . Pulmonary fibrosis   . Osteoporosis   . CAD (coronary artery disease)     Mild CAD by cath 2008  . Hyperlipidemia   . DJD (degenerative joint disease)     rheumatoid  . Rheumatoid arthritis   . Insomnia   . Allergy        Review of Systems  Constitutional: Negative for fever, chills and fatigue.  HENT: Negative for congestion, postnasal drip and rhinorrhea.   Respiratory: Positive for cough and chest tightness. Negative for shortness of breath and wheezing.   Cardiovascular: Negative for chest pain, palpitations and leg swelling.       Objective:   Physical Exam  Filed Vitals:   04/09/15 1605  BP: 104/62  Pulse: 93  Height: 5\' 4"  (1.626 m)  Weight: 138 lb (62.596 kg)  SpO2: 98%  RA  Ambulated 500 feet on RA (forehead probe) and O2 saturation dropped no lower than 90%   Gen: well appearing, no acute distress HEENT: NCAT, PERRL, EOMi,  PULM: Crackles in bases CV: RRR, no mgr, no JVD AB: BS+, soft, nontender, no hsm Ext: warm, trace bilateral edema, no clubbing, no cyanosis       Assessment & Plan:   Postinflammatory pulmonary fibrosis This has been felt to be due to rheumatoid arthritis but fortunately has been slow to progress over the years. Unfortunately we don't have a pulmonary function test  today to compare to prior to look for objective evidence of worsening. From my standpoint, I think continuing her on low-dose prednisone is reasonable as long as she does not have significant side effects. Fortunately her respiratory symptoms have been mild despite her PFT and CT abnormalities.  Should she have progression of arthritis than it would be reasonable to treat her with Imuran if this was felt to be appropriate by her rheumatologist. CellCept also be considered but I'm not sure that that would be helpful for her rheumatoid arthritis. It would be helpful in slowing progression of any lung disease  however.  Plan: Pulmonary function test 6 minute walk Follow-up 6 months  PULMONARY EMBOLISM, HX OF She had several falls in 2015 and so after lengthy discussion we determined it was probably safest to keep her off of warfarin. She has had multiple pulmonary emboli in the past. We have felt that an IVC filter would be too risky.  Plan: Lovenox prescription was written to give herself an injection on the day of a flight longer than 4 hours  RA (rheumatoid arthritis) As above, she has slow to progress pulmonary fibrosis felt to be related to rheumatoid arthritis. If her rheumatologist felt that we needed to escalate therapy I would be comfortable with either Imuran or CellCept. Biologic therapy is reasonable as well.   > 25 minutes spent in direct consultation  Updated Medication List Outpatient Encounter Prescriptions as of 04/09/2015  Medication Sig  . ALPRAZolam (XANAX) 0.5 MG tablet Take 0.5 tablets (0.25 mg total) by mouth as needed for anxiety. (Patient taking differently: Take 0.25 mg by mouth daily as needed for anxiety. )  . cetirizine (ZYRTEC) 10 MG tablet Take 5-10 mg by mouth daily as needed for allergies.  . Cholecalciferol (VITAMIN D3) 2000 UNITS capsule Take 1 capsule (2,000 Units total) by mouth daily.  . fluticasone (FLONASE) 50 MCG/ACT nasal spray Place 2 sprays into both nostrils daily as needed for allergies or rhinitis.  [name] latanoprost (XALATAN) 0.005 % ophthalmic solution Place 1 drop into both eyes at bedtime.   [name] Glycol-Propyl Glycol (SYSTANE) 0.4-0.3 % SOLN Apply 1 drop to eye daily as needed (for dry eyes).  . predniSONE (DELTASONE) 10 MG tablet 1 po qam pc  . traMADol (ULTRAM) 50 MG tablet Take 0.5-1 tablets (25-50 mg total) by mouth every 6 (six) hours as needed.  . enoxaparin (LOVENOX) 30 MG/0.3ML injection Inject 0.3 mLs (30 mg total) into the skin every 12 (twelve) hours.   No facility-administered encounter medications on file as of 04/09/2015.

## 2015-04-09 NOTE — Assessment & Plan Note (Signed)
She had several falls in 2015 and so after lengthy discussion we determined it was probably safest to keep her off of warfarin. She has had multiple pulmonary emboli in the past. We have felt that an IVC filter would be too risky.  Plan: Lovenox prescription was written to give herself an injection on the day of a flight longer than 4 hours

## 2015-04-09 NOTE — Assessment & Plan Note (Signed)
This has been felt to be due to rheumatoid arthritis but fortunately has been slow to progress over the years. Unfortunately we don't have a pulmonary function test today to compare to prior to look for objective evidence of worsening. From my standpoint, I think continuing her on low-dose prednisone is reasonable as long as she does not have significant side effects. Fortunately her respiratory symptoms have been mild despite her PFT and CT abnormalities.  Should she have progression of arthritis than it would be reasonable to treat her with Imuran if this was felt to be appropriate by her rheumatologist. CellCept also be considered but I'm not sure that that would be helpful for her rheumatoid arthritis. It would be helpful in slowing progression of any lung disease however.  Plan: Pulmonary function test 6 minute walk Follow-up 6 months

## 2015-04-09 NOTE — Patient Instructions (Signed)
We will schedule a lung function testing, you with results Use the Lovenox injection on the day of a long flight (longer than 4 hours) We will see you back in 6 months or sooner if needed

## 2015-04-09 NOTE — Assessment & Plan Note (Signed)
As above, she has slow to progress pulmonary fibrosis felt to be related to rheumatoid arthritis. If her rheumatologist felt that we needed to escalate therapy I would be comfortable with either Imuran or CellCept. Biologic therapy is reasonable as well.

## 2015-04-16 ENCOUNTER — Ambulatory Visit (INDEPENDENT_AMBULATORY_CARE_PROVIDER_SITE_OTHER): Payer: Medicare Other | Admitting: Internal Medicine

## 2015-04-16 ENCOUNTER — Encounter: Payer: Self-pay | Admitting: Internal Medicine

## 2015-04-16 VITALS — BP 110/80 | HR 90 | Temp 98.3°F | Resp 18 | Ht 64.0 in | Wt 137.0 lb

## 2015-04-16 DIAGNOSIS — J8410 Pulmonary fibrosis, unspecified: Secondary | ICD-10-CM | POA: Diagnosis not present

## 2015-04-16 DIAGNOSIS — M069 Rheumatoid arthritis, unspecified: Secondary | ICD-10-CM | POA: Diagnosis not present

## 2015-04-16 DIAGNOSIS — Z86718 Personal history of other venous thrombosis and embolism: Secondary | ICD-10-CM | POA: Diagnosis not present

## 2015-04-16 DIAGNOSIS — I25110 Atherosclerotic heart disease of native coronary artery with unstable angina pectoris: Secondary | ICD-10-CM | POA: Diagnosis not present

## 2015-04-16 DIAGNOSIS — Z23 Encounter for immunization: Secondary | ICD-10-CM

## 2015-04-16 NOTE — Patient Instructions (Signed)
We will have you take 1/2 of the prednisone pill everyday to see if you do okay with that. If you have any problems you can call us.

## 2015-04-16 NOTE — Progress Notes (Signed)
Pre visit review using our clinic review tool, if applicable. No additional management support is needed unless otherwise documented below in the visit note. 

## 2015-04-16 NOTE — Progress Notes (Signed)
   Subjective:    Patient ID: [name] [name], female    DOB: 13-Apr-1931, 81 y.o.   MRN: [id number]  HPI The patient is an 81 YO female coming in for follow up of her arthritis. She was prescribed prednisone the last time she was here which she has started taking. She cannot recall now if it was prescribed for her joints or her lungs. She is not having any joint pain now. Has a visit with her rheumatologist in November. Thinks the prednisone is making her feel bad and wants to know if we can change. She has not been on it chronically just started about 1 month ago. No fevers or chills. No joint swelling. Still some mild SOB on exertion (hx pulmonary fibrosis).   Review of Systems  Constitutional: Negative for fever, chills, appetite change, fatigue and unexpected weight change.  HENT: Negative.   Respiratory: Positive for cough, chest tightness and shortness of breath. Negative for wheezing.        With exertion, stable  Cardiovascular: Negative for chest pain, palpitations and leg swelling.  Gastrointestinal: Negative for nausea, abdominal pain, diarrhea, constipation and abdominal distention.  Musculoskeletal: Negative for myalgias, back pain, arthralgias and gait problem.  Neurological: Negative.   Psychiatric/Behavioral: Negative.       Objective:   Physical Exam  Constitutional: She is oriented to person, place, and time. She appears well-developed and well-nourished.  Slightly frail appearing  HENT:  Head: Normocephalic and atraumatic.  Eyes: EOM are normal.  Neck: Normal range of motion.  Cardiovascular: Normal rate and regular rhythm.   Pulmonary/Chest: Effort normal. No respiratory distress. She has no wheezes. She has no rales.  Abdominal: Soft. Bowel sounds are normal.  Musculoskeletal: She exhibits no tenderness.  Neurological: She is alert and oriented to person, place, and time. Coordination normal.  Skin: Skin is warm and dry.  Psychiatric: She has a normal mood and  affect.   Filed Vitals:   04/16/15 1017  BP: 110/80  Pulse: 90  Temp: 98.3 F (36.8 C)  TempSrc: Oral  Resp: 18  Height: 5\' 4"  (1.626 m)  Weight: 137 lb (62.143 kg)  SpO2: 93%      Assessment & Plan:  Flu shot and prevnar 13 shot given at visit.

## 2015-04-17 NOTE — Assessment & Plan Note (Signed)
Lungs stable at this time and no new symptoms. Still some mild DOE which is relieved with rest.

## 2015-04-17 NOTE — Assessment & Plan Note (Signed)
Recent CT chest without new PE, not on anticoagulation.

## 2015-04-17 NOTE — Assessment & Plan Note (Signed)
Will have her decrease prednisone to 5 mg daily. She will see rheumatology in November and would like to have her off chronic steroids.

## 2015-04-30 ENCOUNTER — Ambulatory Visit: Payer: Medicare Other

## 2015-05-13 DIAGNOSIS — H26491 Other secondary cataract, right eye: Secondary | ICD-10-CM | POA: Diagnosis not present

## 2015-05-13 DIAGNOSIS — H40013 Open angle with borderline findings, low risk, bilateral: Secondary | ICD-10-CM | POA: Diagnosis not present

## 2015-05-19 DIAGNOSIS — M0589 Other rheumatoid arthritis with rheumatoid factor of multiple sites: Secondary | ICD-10-CM | POA: Diagnosis not present

## 2015-05-19 DIAGNOSIS — R768 Other specified abnormal immunological findings in serum: Secondary | ICD-10-CM | POA: Diagnosis not present

## 2015-05-19 DIAGNOSIS — J8410 Pulmonary fibrosis, unspecified: Secondary | ICD-10-CM | POA: Diagnosis not present

## 2015-05-26 DIAGNOSIS — H5711 Ocular pain, right eye: Secondary | ICD-10-CM | POA: Diagnosis not present

## 2015-05-27 ENCOUNTER — Ambulatory Visit (INDEPENDENT_AMBULATORY_CARE_PROVIDER_SITE_OTHER): Payer: Medicare Other | Admitting: Pulmonary Disease

## 2015-05-27 DIAGNOSIS — J8410 Pulmonary fibrosis, unspecified: Secondary | ICD-10-CM | POA: Diagnosis not present

## 2015-05-27 LAB — PULMONARY FUNCTION TEST
DL/VA % pred: 60 %
DL/VA: 2.96 ml/min/mmHg/L
DLCO UNC % PRED: 34 %
DLCO UNC: 8.8 ml/min/mmHg
FEF 25-75 PRE: 1.69 L/s
FEF 25-75 Post: 1.59 L/sec
FEF2575-%Change-Post: -6 %
FEF2575-%PRED-PRE: 132 %
FEF2575-%Pred-Post: 123 %
FEV1-%Change-Post: -2 %
FEV1-%PRED-POST: 99 %
FEV1-%Pred-Pre: 101 %
FEV1-POST: 1.58 L
FEV1-Pre: 1.61 L
FEV1FVC-%Change-Post: 4 %
FEV1FVC-%Pred-Pre: 110 %
FEV6-%CHANGE-POST: -6 %
FEV6-%PRED-POST: 93 %
FEV6-%PRED-PRE: 99 %
FEV6-PRE: 1.94 L
FEV6-Post: 1.81 L
FEV6FVC-%CHANGE-POST: 0 %
FEV6FVC-%PRED-PRE: 103 %
FEV6FVC-%Pred-Post: 104 %
FVC-%CHANGE-POST: -6 %
FVC-%Pred-Post: 88 %
FVC-%Pred-Pre: 95 %
FVC-Post: 1.81 L
FVC-Pre: 1.94 L
POST FEV6/FVC RATIO: 100 %
PRE FEV1/FVC RATIO: 83 %
Post FEV1/FVC ratio: 87 %
Pre FEV6/FVC Ratio: 100 %
RV % PRED: 54 %
RV: 1.36 L
TLC % PRED: 64 %
TLC: 3.38 L

## 2015-05-27 NOTE — Progress Notes (Signed)
PFT done today. 

## 2015-05-29 DIAGNOSIS — M542 Cervicalgia: Secondary | ICD-10-CM | POA: Diagnosis not present

## 2015-05-29 DIAGNOSIS — M6281 Muscle weakness (generalized): Secondary | ICD-10-CM | POA: Diagnosis not present

## 2015-05-29 DIAGNOSIS — M79643 Pain in unspecified hand: Secondary | ICD-10-CM | POA: Diagnosis not present

## 2015-05-29 DIAGNOSIS — R269 Unspecified abnormalities of gait and mobility: Secondary | ICD-10-CM | POA: Diagnosis not present

## 2015-05-29 DIAGNOSIS — M25519 Pain in unspecified shoulder: Secondary | ICD-10-CM | POA: Diagnosis not present

## 2015-06-01 DIAGNOSIS — M6281 Muscle weakness (generalized): Secondary | ICD-10-CM | POA: Diagnosis not present

## 2015-06-01 DIAGNOSIS — M542 Cervicalgia: Secondary | ICD-10-CM | POA: Diagnosis not present

## 2015-06-01 DIAGNOSIS — R269 Unspecified abnormalities of gait and mobility: Secondary | ICD-10-CM | POA: Diagnosis not present

## 2015-06-01 DIAGNOSIS — M79643 Pain in unspecified hand: Secondary | ICD-10-CM | POA: Diagnosis not present

## 2015-06-01 DIAGNOSIS — M25519 Pain in unspecified shoulder: Secondary | ICD-10-CM | POA: Diagnosis not present

## 2015-06-02 ENCOUNTER — Ambulatory Visit: Payer: Medicare Other | Admitting: Pulmonary Disease

## 2015-06-02 DIAGNOSIS — R0600 Dyspnea, unspecified: Secondary | ICD-10-CM

## 2015-06-03 DIAGNOSIS — M542 Cervicalgia: Secondary | ICD-10-CM | POA: Diagnosis not present

## 2015-06-03 DIAGNOSIS — R269 Unspecified abnormalities of gait and mobility: Secondary | ICD-10-CM | POA: Diagnosis not present

## 2015-06-03 DIAGNOSIS — M25519 Pain in unspecified shoulder: Secondary | ICD-10-CM | POA: Diagnosis not present

## 2015-06-03 DIAGNOSIS — M79643 Pain in unspecified hand: Secondary | ICD-10-CM | POA: Diagnosis not present

## 2015-06-03 DIAGNOSIS — M6281 Muscle weakness (generalized): Secondary | ICD-10-CM | POA: Diagnosis not present

## 2015-06-08 DIAGNOSIS — M542 Cervicalgia: Secondary | ICD-10-CM | POA: Diagnosis not present

## 2015-06-08 DIAGNOSIS — M79643 Pain in unspecified hand: Secondary | ICD-10-CM | POA: Diagnosis not present

## 2015-06-08 DIAGNOSIS — M6281 Muscle weakness (generalized): Secondary | ICD-10-CM | POA: Diagnosis not present

## 2015-06-08 DIAGNOSIS — M25519 Pain in unspecified shoulder: Secondary | ICD-10-CM | POA: Diagnosis not present

## 2015-06-08 DIAGNOSIS — R269 Unspecified abnormalities of gait and mobility: Secondary | ICD-10-CM | POA: Diagnosis not present

## 2015-06-10 DIAGNOSIS — M542 Cervicalgia: Secondary | ICD-10-CM | POA: Diagnosis not present

## 2015-06-10 DIAGNOSIS — M25519 Pain in unspecified shoulder: Secondary | ICD-10-CM | POA: Diagnosis not present

## 2015-06-10 DIAGNOSIS — M79643 Pain in unspecified hand: Secondary | ICD-10-CM | POA: Diagnosis not present

## 2015-06-10 DIAGNOSIS — M6281 Muscle weakness (generalized): Secondary | ICD-10-CM | POA: Diagnosis not present

## 2015-06-10 DIAGNOSIS — R269 Unspecified abnormalities of gait and mobility: Secondary | ICD-10-CM | POA: Diagnosis not present

## 2015-06-11 ENCOUNTER — Telehealth: Payer: Self-pay | Admitting: Pulmonary Disease

## 2015-06-11 DIAGNOSIS — R768 Other specified abnormal immunological findings in serum: Secondary | ICD-10-CM | POA: Diagnosis not present

## 2015-06-11 DIAGNOSIS — J8410 Pulmonary fibrosis, unspecified: Secondary | ICD-10-CM | POA: Diagnosis not present

## 2015-06-11 DIAGNOSIS — M0589 Other rheumatoid arthritis with rheumatoid factor of multiple sites: Secondary | ICD-10-CM | POA: Diagnosis not present

## 2015-06-11 NOTE — Telephone Encounter (Signed)
Per PFT result note: Please let her know that her pulmonary function testing is actually better compared to one year ago ---  I spoke with patient about results and he verbalized understanding and had no questions

## 2015-06-11 NOTE — Progress Notes (Signed)
Quick Note:  LVM for patient to return call. ______ 

## 2015-06-15 DIAGNOSIS — M542 Cervicalgia: Secondary | ICD-10-CM | POA: Diagnosis not present

## 2015-06-15 DIAGNOSIS — M6281 Muscle weakness (generalized): Secondary | ICD-10-CM | POA: Diagnosis not present

## 2015-06-15 DIAGNOSIS — M79643 Pain in unspecified hand: Secondary | ICD-10-CM | POA: Diagnosis not present

## 2015-06-15 DIAGNOSIS — M25519 Pain in unspecified shoulder: Secondary | ICD-10-CM | POA: Diagnosis not present

## 2015-06-15 DIAGNOSIS — R269 Unspecified abnormalities of gait and mobility: Secondary | ICD-10-CM | POA: Diagnosis not present

## 2015-06-17 DIAGNOSIS — M79643 Pain in unspecified hand: Secondary | ICD-10-CM | POA: Diagnosis not present

## 2015-06-17 DIAGNOSIS — M6281 Muscle weakness (generalized): Secondary | ICD-10-CM | POA: Diagnosis not present

## 2015-06-17 DIAGNOSIS — M25519 Pain in unspecified shoulder: Secondary | ICD-10-CM | POA: Diagnosis not present

## 2015-06-17 DIAGNOSIS — M542 Cervicalgia: Secondary | ICD-10-CM | POA: Diagnosis not present

## 2015-06-17 DIAGNOSIS — R269 Unspecified abnormalities of gait and mobility: Secondary | ICD-10-CM | POA: Diagnosis not present

## 2015-06-29 ENCOUNTER — Telehealth: Payer: Self-pay | Admitting: Pulmonary Disease

## 2015-06-29 MED ORDER — DOXYCYCLINE HYCLATE 100 MG PO TABS: 100.0000 mg | ORAL_TABLET | Freq: Two times a day (BID) | ORAL | Status: DC

## 2015-06-29 NOTE — Telephone Encounter (Signed)
Patient has horrible cough x 1 week, green mucus, sore throat, sore chest from coughing so much.  No fever.  Taking a prescription of cough syrup and prednisone right now that was given to her from her Rheumatologist.  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   Pharmacy:  [facility] [address]   Allergies  Allergen Reactions  . Crestor [Rosuvastatin] Other (See Comments)    Muscle pain  . Lactose Intolerance (Gi) Other (See Comments)    Stomach upset  . Penicillins Hives  . Niacin Hives

## 2015-06-29 NOTE — Telephone Encounter (Signed)
Rx sent to pharmacy for Doxy Patient notified of BQ's recommendations. Nothing further needed. Closing encounter

## 2015-06-29 NOTE — Telephone Encounter (Signed)
It sounds like she may have bronchitis Take Doxycycline 100mg  po bid x 5 days Call or ER if worse

## 2015-07-10 ENCOUNTER — Encounter: Payer: Self-pay | Admitting: Internal Medicine

## 2015-07-10 ENCOUNTER — Ambulatory Visit (INDEPENDENT_AMBULATORY_CARE_PROVIDER_SITE_OTHER): Payer: Medicare Other | Admitting: Internal Medicine

## 2015-07-10 VITALS — BP 118/78 | HR 101 | Temp 98.0°F | Resp 20 | Ht 64.0 in | Wt 140.0 lb

## 2015-07-10 DIAGNOSIS — I25110 Atherosclerotic heart disease of native coronary artery with unstable angina pectoris: Secondary | ICD-10-CM

## 2015-07-10 DIAGNOSIS — R05 Cough: Secondary | ICD-10-CM | POA: Diagnosis not present

## 2015-07-10 DIAGNOSIS — R059 Cough, unspecified: Secondary | ICD-10-CM

## 2015-07-10 NOTE — Assessment & Plan Note (Signed)
At this time she has done a course of doxycycline and lung exam clear. Gradually improving. Can use hycodan cough syrup at night time and tessalon perles she has at home. Other conservative measures discussed with her at visit.

## 2015-07-10 NOTE — Progress Notes (Signed)
   Subjective:    Patient ID: [name] [name], female    DOB: 1931/03/19, 81 y.o.   MRN: [id number]  HPI The patient is an 81 YO female coming in for cough. It has been going on for several weeks now and is not improving. She called in to the pulmonary office and they gave her a course of doxycycline. Since that time it is improving and less nasal symptoms. She is still coughing and unable to sleep well due to cough. She has cough syrup leftover from the last year and wonders if it is okay to take.   Review of Systems  Constitutional: Negative for fever, chills, appetite change, fatigue and unexpected weight change.  HENT: Positive for congestion, postnasal drip and rhinorrhea. Negative for sinus pressure and sore throat.   Respiratory: Positive for cough. Negative for chest tightness, shortness of breath and wheezing.        With exertion, stable  Cardiovascular: Negative for chest pain, palpitations and leg swelling.  Gastrointestinal: Negative for nausea, abdominal pain, diarrhea, constipation and abdominal distention.  Musculoskeletal: Negative for myalgias, back pain, arthralgias and gait problem.  Neurological: Negative.   Psychiatric/Behavioral: Negative.       Objective:   Physical Exam  Constitutional: She is oriented to person, place, and time. She appears well-developed and well-nourished.  HENT:  Head: Normocephalic and atraumatic.  Oropharynx with redness and mild clear drainage  Eyes: EOM are normal.  Neck: Normal range of motion.  Cardiovascular: Normal rate and regular rhythm.   Pulmonary/Chest: Effort normal and breath sounds normal. No respiratory distress. She has no wheezes. She has no rales.  Abdominal: Soft. Bowel sounds are normal.  Musculoskeletal: She exhibits no tenderness.  Lymphadenopathy:    She has no cervical adenopathy.  Neurological: She is alert and oriented to person, place, and time. Coordination normal.  Skin: Skin is warm and dry.  Psychiatric:  She has a normal mood and affect.   Filed Vitals:   07/10/15 0812  BP: 118/78  Pulse: 101  Temp: 98 F (36.7 C)  TempSrc: Oral  Resp: 20  Height: 5\' 4"  (1.626 m)  Weight: 140 lb (63.504 kg)  SpO2: 95%      Assessment & Plan:

## 2015-07-10 NOTE — Patient Instructions (Signed)
You do not need any more antibiotics today.   It is okay to use the cough syrup with your other medicines. It is also okay to try those pearls if you still have some at home.   Your lungs sound clear.

## 2015-07-10 NOTE — Progress Notes (Signed)
Pre visit review using our clinic review tool, if applicable. No additional management support is needed unless otherwise documented below in the visit note. 

## 2015-07-17 ENCOUNTER — Ambulatory Visit: Payer: Medicare Other | Admitting: Internal Medicine

## 2015-07-21 ENCOUNTER — Telehealth: Payer: Self-pay | Admitting: Internal Medicine

## 2015-07-21 DIAGNOSIS — R2681 Unsteadiness on feet: Secondary | ICD-10-CM

## 2015-07-21 NOTE — Telephone Encounter (Signed)
Placed order

## 2015-07-21 NOTE — Telephone Encounter (Signed)
[name] [name] called from [facility] on [address] request order for PT. Please give him call back  Phone # [phone]

## 2015-07-21 NOTE — Telephone Encounter (Signed)
Spoke with [name] from [facility]. Patient needs PT for all over pain and help with balance, gait, and mobility. She has muscle weakness and unspecified pain in hands and shoulders, per the physical therapist. Please place order and I will fax to [phone]. She has Medicare and a referral is needed from her PCP.

## 2015-07-23 ENCOUNTER — Ambulatory Visit (INDEPENDENT_AMBULATORY_CARE_PROVIDER_SITE_OTHER): Payer: Medicare Other | Admitting: Podiatry

## 2015-07-23 ENCOUNTER — Ambulatory Visit (INDEPENDENT_AMBULATORY_CARE_PROVIDER_SITE_OTHER): Payer: Medicare Other | Admitting: Cardiovascular Disease

## 2015-07-23 ENCOUNTER — Encounter: Payer: Self-pay | Admitting: Cardiovascular Disease

## 2015-07-23 ENCOUNTER — Encounter: Payer: Self-pay | Admitting: Podiatry

## 2015-07-23 ENCOUNTER — Ambulatory Visit (INDEPENDENT_AMBULATORY_CARE_PROVIDER_SITE_OTHER): Payer: Medicare Other

## 2015-07-23 VITALS — BP 104/65 | HR 96 | Resp 16 | Ht 64.0 in | Wt 138.0 lb

## 2015-07-23 VITALS — BP 98/60 | HR 86 | Ht 64.0 in | Wt 138.6 lb

## 2015-07-23 DIAGNOSIS — M79673 Pain in unspecified foot: Secondary | ICD-10-CM

## 2015-07-23 DIAGNOSIS — I2510 Atherosclerotic heart disease of native coronary artery without angina pectoris: Secondary | ICD-10-CM

## 2015-07-23 DIAGNOSIS — M7661 Achilles tendinitis, right leg: Secondary | ICD-10-CM

## 2015-07-23 DIAGNOSIS — M779 Enthesopathy, unspecified: Secondary | ICD-10-CM

## 2015-07-23 DIAGNOSIS — E785 Hyperlipidemia, unspecified: Secondary | ICD-10-CM

## 2015-07-23 MED ORDER — TRIAMCINOLONE ACETONIDE 10 MG/ML IJ SUSP
10.0000 mg | Freq: Once | INTRAMUSCULAR | Status: AC
  Administered 2015-07-23: 10 mg

## 2015-07-23 MED ORDER — ATORVASTATIN CALCIUM 10 MG PO TABS: 10.0000 mg | ORAL_TABLET | Freq: Every day | ORAL | Status: DC

## 2015-07-23 NOTE — Patient Instructions (Signed)

## 2015-07-23 NOTE — Patient Instructions (Signed)
Medication Instructions:   Your physician has recommended you make the following change in your medication:  Start atorvastatin 10 mg by mouth daily    Labwork: Your physician recommends that you return for lab work in: 12 weeks--Scheduled for April 3,2017.  The lab opens at 7:30 AM.  This is fasting lab work.    Testing/Procedures: none  Follow-Up: Your physician wants you to follow-up in: 12 months.  You will receive a reminder letter in the mail two months in advance. If you don't receive a letter, please call our office to schedule the follow-up appointment.   Any Other Special Instructions Will Be Listed Below (If Applicable).     If you need a refill on your cardiac medications before your next appointment, please call your pharmacy.

## 2015-07-23 NOTE — Progress Notes (Signed)
   Subjective:    Patient ID: [name], female    DOB: 1931/05/01, 82 y.o.   MRN: [id number]  HPI Patient presents with bilateral ankle pain; lateral side. Pt stated, "left hurts more than right foot"; x1 month.  Patient also presents with foot pain in their right foot; back of heel; x1 month.   Review of Systems  Constitutional: Positive for unexpected weight change.  HENT: Positive for sinus pressure and sore throat.   Skin: Positive for color change.  Neurological: Positive for dizziness.  All other systems reviewed and are negative.      Objective:   Physical Exam        Assessment & Plan:

## 2015-07-23 NOTE — Progress Notes (Signed)
Chief Complaint  Patient presents with  . Follow-up    pt c/o sob and swelling in legs/ankles  . Coronary Artery Disease      History of Present Illness: 82 yo [name] [name] with history of CAD, DVT/PE, HLD, pulmonary fibrosis who is here today for cardiac follow up. I saw her 02/27/12 as a new patient for evaluation of chest pains and pain between her shoulder blades. Stress test was normal in August 2013. Known to have mild CAD by cath 2008. Admitted December 2014 with chest pain. She underwent cardiac catheterization on December 9,2014 and found to have 30% mid LAD, 30% mid Circumflex, 20% mid RCA. She has not tolerated statins in the past. Echo 03/06/12 with normal LV size and function with no significant valvular abnormalities.   She is here today for f/u. She denies chest pain. She does endorse leg swelling to that comes and goes and pain in her right heel and left ankle.   Primary Care Physician: Lavone Orn   Past Medical History  Diagnosis Date  . Allergic rhinitis   . History of pulmonary embolism   . DVT (deep venous thrombosis) ([employer])   . Dyslipidemia   . Pulmonary fibrosis ([place])   . Osteoporosis   . CAD (coronary artery disease)     Mild CAD by cath 2008  . Hyperlipidemia   . DJD (degenerative joint disease)     rheumatoid  . Rheumatoid arthritis ([place])   . Insomnia   . Allergy     Past Surgical History  Procedure Laterality Date  . Total knee arthroplasty  1997, 2007    Bilateral  . Tonsillectomy  1941, 1951  . Left heart catheterization with coronary angiogram N/A 06/18/2013    Procedure: LEFT HEART CATHETERIZATION WITH CORONARY ANGIOGRAM;  Surgeon: Peter M Martinique, MD;  Location: [facility] CATH LAB;  Service: Cardiovascular;  Laterality: N/A;  . Cataract extraction    . Abdominal hysterectomy  1974  . Appendectomy  1959    Current Outpatient Prescriptions  Medication Sig Dispense Refill  . ALPRAZolam (XANAX) 0.5 MG tablet Take 0.5 tablets (0.25 mg total) by mouth as needed  for anxiety. (Patient taking differently: Take 0.25 mg by mouth daily as needed for anxiety. ) 30 tablet 0  . cetirizine (ZYRTEC) 10 MG tablet Take 5-10 mg by mouth daily as needed for allergies.    . Cholecalciferol (VITAMIN D3) 2000 UNITS capsule Take 1 capsule (2,000 Units total) by mouth daily. 100 capsule 3  . enoxaparin (LOVENOX) 30 MG/0.3ML injection Inject 0.3 mLs (30 mg total) into the skin every 12 (twelve) hours. 0.6 mL 2  . fluticasone (FLONASE) 50 MCG/ACT nasal spray Place 2 sprays into both nostrils daily as needed for allergies or rhinitis. 16 g 3  . HYDROcodone-homatropine (HYCODAN) 5-1.5 MG/5ML syrup Take 5 mLs by mouth every 6 (six) hours as needed for cough.    . latanoprost (XALATAN) 0.005 % ophthalmic solution Place 1 drop into both eyes at bedtime.   0  . Polyethyl Glycol-Propyl Glycol (SYSTANE) 0.4-0.3 % SOLN Apply 1 drop to eye daily as needed (for dry eyes).    . predniSONE (DELTASONE) 10 MG tablet 1 po qam pc (Patient taking differently: Take 10 mg by mouth daily with breakfast. 1 po qam pc) 30 tablet 2  . atorvastatin (LIPITOR) 10 MG tablet Take 1 tablet (10 mg total) by mouth daily. 30 tablet 11   No current facility-administered medications for this visit.    Allergies  Allergen  Reactions  . Crestor [Rosuvastatin] Other (See Comments)    Muscle pain  . Lactose Intolerance (Gi) Other (See Comments)    Stomach upset  . Penicillins Hives  . Niacin Hives    Social History   Social History  . Marital Status: Widowed    Spouse Name: N/A  . Number of Children: 2  . Years of Education: N/A   Occupational History  . government Designer, television/film set     retired   Social History Main Topics  . Smoking status: Former Smoker -- 0.30 packs/day for 20 years    Types: Cigarettes    Quit date: 07/11/1973  . Smokeless tobacco: Never Used  . Alcohol Use: 0.0 oz/week     Comment: approx 1 glass wine a month  . Drug Use: No  . Sexual Activity: No   Other Topics  Concern  . Not on file   Social History Narrative   Diet:   Do you drink/eat things with caffeine? Yes   Marital status:        Widowed                      What year were you married?1954   Do you live in a house, apartment, assisted living, condo, trailer, etc)? House   Is it one or more stories? 1 1/4   How many persons live in your home?1   Do you have any pets in your home? No   Current or past profession:  Publishing rights manager   Do you exercise?        Yes                                            Type & how often:  Walk                                    Do you have a living will? Yes   Do you have a DNR Form? Yes   Do you have a POA/HPOA forms?  Yes       Family History  Problem Relation Age of Onset  . Kidney disease Daughter 5  . Cancer Daughter   . Heart attack Father 42  . Alzheimer's disease Sister   . Heart disease Sister   . Alzheimer's disease Sister     Review of Systems:  As stated in the HPI and otherwise negative.   BP 98/60 mmHg  Pulse 86  Ht 5\' 4"  (1.626 m)  Wt 138 lb 9.6 oz (62.869 kg)  BMI 23.78 kg/m2  SpO2 97%  Physical Examination: General: Well developed, well nourished, NAD HEENT: OP clear, mucus membranes moist SKIN: warm, dry. No rashes. Neuro: No focal deficits Musculoskeletal: Muscle strength 5/5 all ext Psychiatric: Mood and affect normal Neck: No JVD, no carotid bruits, no thyromegaly, no lymphadenopathy. Lungs:Clear bilaterally, no wheezes, rhonci, crackles Cardiovascular: Regular rate and rhythm. No murmurs, gallops or rubs. Abdomen:Soft. Bowel sounds present. Non-tender.  Extremities: No lower extremity edema. Pulses are 2 + in the bilateral DP/PT.  Exercise Stress Myoview: 03/05/12:  Stress Procedure: The patient performed treadmill exercise using a Bruce Protocol for minutes. The patient stopped due to SOB and denied any chest pain. There were no significant ST-T wave changes. Technetium 91m Tetrofosmin was  injected  at peak exercise and myocardial perfusion imaging was performed after a brief delay.  Stress ECG: No significant change from baseline ECG  QPS  Raw Data Images: Images were motion corrected. Soft tissue (diaphragm, breast) surround heart.  Stress Images: Normal homogeneous uptake in all areas of the myocardium.  Rest Images: Normal homogeneous uptake in all areas of the myocardium.  Subtraction (SDS): No evidence of ischemia.  Transient Ischemic Dilatation (Normal <1.22): *0.76**  Lung/Heart Ratio (Normal <0.45): 0.42  Quantitative Gated Spect Images  QGS EDV: 42 ml  QGS ESV: 6 ml  Impression  Exercise Capacity: Poor exercise capacity.  BP Response: Normal blood pressure response.  Clinical Symptoms: No chest pain.  ECG Impression: No significant ST segment change suggestive of ischemia.  Comparison with Prior Nuclear Study: No images to compare  Overall Impression: Normal stress nuclear study.  Echo 03/06/12:  Left ventricle: The cavity size was normal. Wall thickness was normal. Systolic function was normal. The estimated ejection fraction was in the range of [name]% to 60%. Wall motion was normal; there were no regional wall motion abnormalities. Doppler parameters are consistent with abnormal left ventricular relaxation (grade 1 diastolic dysfunction). - Aortic valve: There was no stenosis. - Mitral valve: No significant regurgitation. - Right ventricle: The cavity size was normal. Systolic function was mildly reduced. - Pulmonary arteries: PA peak pressure: 33mm Hg (S). - Inferior vena cava: The vessel was normal in size; the respirophasic diameter changes were in the normal range (= 50%); findings are consistent with normal central venous pressure. Impressions:  - Normal LV size and systolic function, EF 0000000. Normal RV size with mildly decreased systolic function. Doppler interrogation of TR jet does not suggest pulmonary hypertension. No significant valvular  abnormalities.  Cardiac cath 06/18/13: Left mainstem: Normal  Left anterior descending (LAD): Mild calcification. 30% disease in the mid vessel at the take off of the first diagonal.  Left circumflex (LCx): 30% disease in the mid vessel.  Right coronary artery (RCA): Mild calcification. Mild disease in the proximal and mid vessel to 20%.  Left ventriculography: Left ventricular systolic function is normal, LVEF is estimated at [name]-65%, there is no significant mitral regurgitation   EKG:  EKG is ordered today. The ekg ordered today demonstrates NSR, rate 95 bpm. LAE.   Recent Labs: 03/17/2015: ALT 15; BUN 7; Creatinine, Ser 0.80; Hemoglobin 14.5; Platelets 221.0; Potassium 4.6; Sodium 139   Lipid Panel    Component Value Date/Time   CHOL 234* 09/11/2014 1057   TRIG 185.0* 09/11/2014 1057   HDL 41.70 09/11/2014 1057   CHOLHDL 6 09/11/2014 1057   VLDL 37.0 09/11/2014 1057   LDLCALC 155* 09/11/2014 1057     Wt Readings from Last 3 Encounters:  07/23/15 138 lb 9.6 oz (62.869 kg)  07/10/15 140 lb (63.504 kg)  04/16/15 137 lb (62.143 kg)     Other studies Reviewed: Additional studies/ records that were reviewed today include: . Review of the above records demonstrates:    Assessment and Plan:   1. CAD: She is known to have mild CAD by cath December 2014. She has not been on a beta blocker or statin. She has been intolerant to statins in the past She is willing to try low dose of Lipitor. Will start Lipitor 20 mg daily. She will continue taking ASA 81 mg po Qdaily. She does not wish to start a beta blocker.   2. Hyperlipidemia: See above. Start statin. Lipids and LFTS in 12 weeks.  Current medicines are reviewed at length with the patient today.  The patient does not have concerns regarding medicines.  The following changes have been made:  no change  Labs/ tests ordered today include:   Orders Placed This Encounter  Procedures  . Lipid Profile  . Hepatic function panel  .  EKG 12-Lead     Disposition:   FU with me  in 12  months   Signed, Lauree Chandler, MD 07/23/2015 1:03 PM    [facility] [place], [place], [place]  [address] Phone: [phone]; Fax: [phone]

## 2015-07-24 ENCOUNTER — Encounter: Payer: Self-pay | Admitting: Gastroenterology

## 2015-07-26 NOTE — Progress Notes (Signed)
Subjective:     Patient ID: [name] [name], female   DOB: Apr 08, 1931, 82 y.o.   MRN: [id number]  HPI patient presents stating I have had a lot of pain in the outside of my right ankle for the last several months and slight on the left and I do not remember specific injury   Review of Systems  All other systems reviewed and are negative.      Objective:   Physical Exam  Constitutional: She is oriented to person, place, and time.  Cardiovascular: Intact distal pulses.   Musculoskeletal: Normal range of motion.  Neurological: She is oriented to person, place, and time.  Skin: Skin is warm and dry.  Nursing note and vitals reviewed.  neurovascular status found to be intact muscle strength adequate range of motion within normal limits with patient found to have splinting on the right lateral side with pain in the peroneal tendon group and fluid buildup but no indication of muscle strength loss or tear. Good digital perfusion well oriented 3     Assessment:     Probable tendinitis of the peroneal group lateral ankle with no indications of tear or other    Plan:     H&P condition reviewed an x-ray reviewed. At this point I did do a careful sheath injection right and applied fascial brace to support. I instructed on physical therapy and reduced activity and shoe gear modification and reappoint if symptoms persist

## 2015-08-25 DIAGNOSIS — R3129 Other microscopic hematuria: Secondary | ICD-10-CM | POA: Diagnosis not present

## 2015-08-25 DIAGNOSIS — N390 Urinary tract infection, site not specified: Secondary | ICD-10-CM | POA: Diagnosis not present

## 2015-08-25 DIAGNOSIS — B9620 Unspecified Escherichia coli [E. coli] as the cause of diseases classified elsewhere: Secondary | ICD-10-CM | POA: Diagnosis not present

## 2015-08-25 DIAGNOSIS — R300 Dysuria: Secondary | ICD-10-CM | POA: Diagnosis not present

## 2015-08-25 DIAGNOSIS — Z0000 Encounter for general adult medical examination without abnormal findings: Secondary | ICD-10-CM | POA: Diagnosis not present

## 2015-09-09 DIAGNOSIS — H40011 Open angle with borderline findings, low risk, right eye: Secondary | ICD-10-CM | POA: Diagnosis not present

## 2015-09-09 DIAGNOSIS — H26491 Other secondary cataract, right eye: Secondary | ICD-10-CM | POA: Diagnosis not present

## 2015-09-09 DIAGNOSIS — H40012 Open angle with borderline findings, low risk, left eye: Secondary | ICD-10-CM | POA: Diagnosis not present

## 2015-09-09 DIAGNOSIS — Z961 Presence of intraocular lens: Secondary | ICD-10-CM | POA: Diagnosis not present

## 2015-09-10 DIAGNOSIS — Z79899 Other long term (current) drug therapy: Secondary | ICD-10-CM | POA: Diagnosis not present

## 2015-09-10 DIAGNOSIS — J8410 Pulmonary fibrosis, unspecified: Secondary | ICD-10-CM | POA: Diagnosis not present

## 2015-09-10 DIAGNOSIS — M0589 Other rheumatoid arthritis with rheumatoid factor of multiple sites: Secondary | ICD-10-CM | POA: Diagnosis not present

## 2015-09-10 DIAGNOSIS — R768 Other specified abnormal immunological findings in serum: Secondary | ICD-10-CM | POA: Diagnosis not present

## 2015-09-23 DIAGNOSIS — Z0000 Encounter for general adult medical examination without abnormal findings: Secondary | ICD-10-CM | POA: Diagnosis not present

## 2015-09-23 DIAGNOSIS — R310 Gross hematuria: Secondary | ICD-10-CM | POA: Diagnosis not present

## 2015-09-23 DIAGNOSIS — R3129 Other microscopic hematuria: Secondary | ICD-10-CM | POA: Diagnosis not present

## 2015-09-28 DIAGNOSIS — R310 Gross hematuria: Secondary | ICD-10-CM | POA: Diagnosis not present

## 2015-10-12 ENCOUNTER — Other Ambulatory Visit: Payer: Medicare Other

## 2015-10-13 ENCOUNTER — Other Ambulatory Visit (INDEPENDENT_AMBULATORY_CARE_PROVIDER_SITE_OTHER): Payer: Medicare Other | Admitting: *Deleted

## 2015-10-13 DIAGNOSIS — E785 Hyperlipidemia, unspecified: Secondary | ICD-10-CM | POA: Diagnosis not present

## 2015-10-13 LAB — HEPATIC FUNCTION PANEL
ALBUMIN: 3.5 g/dL — AB (ref 3.6–5.1)
ALT: 15 U/L (ref 6–29)
AST: 19 U/L (ref 10–35)
Alkaline Phosphatase: 78 U/L (ref 33–130)
BILIRUBIN DIRECT: 0.1 mg/dL (ref <=0.2)
Indirect Bilirubin: 0.7 mg/dL (ref 0.2–1.2)
Total Bilirubin: 0.8 mg/dL (ref 0.2–1.2)
Total Protein: 7 g/dL (ref 6.1–8.1)

## 2015-10-13 LAB — LIPID PANEL
CHOL/HDL RATIO: 3.1 ratio (ref <=5.0)
CHOLESTEROL: 204 mg/dL — AB (ref 125–200)
HDL: 65 mg/dL (ref >=46)
LDL Cholesterol: 113 mg/dL (ref <130)
TRIGLYCERIDES: 129 mg/dL (ref <150)
VLDL: 26 mg/dL (ref <30)

## 2015-11-30 ENCOUNTER — Telehealth: Payer: Self-pay | Admitting: Cardiovascular Disease

## 2015-11-30 ENCOUNTER — Telehealth: Payer: Self-pay | Admitting: *Deleted

## 2015-11-30 ENCOUNTER — Ambulatory Visit (HOSPITAL_COMMUNITY): Payer: Self-pay

## 2015-11-30 ENCOUNTER — Ambulatory Visit (INDEPENDENT_AMBULATORY_CARE_PROVIDER_SITE_OTHER): Payer: Medicare Other | Admitting: Physician Assistant

## 2015-11-30 ENCOUNTER — Encounter: Payer: Self-pay | Admitting: Physician Assistant

## 2015-11-30 ENCOUNTER — Other Ambulatory Visit (HOSPITAL_COMMUNITY)
Admit: 2015-11-30 | Discharge: 2015-11-30 | Disposition: A | Discharge status: Home / Self Care | Payer: Medicare Other | Attending: Physician Assistant | Admitting: Physician Assistant

## 2015-11-30 VITALS — BP 100/68 | HR 94 | Ht 64.0 in | Wt 131.1 lb

## 2015-11-30 DIAGNOSIS — R072 Precordial pain: Secondary | ICD-10-CM | POA: Insufficient documentation

## 2015-11-30 DIAGNOSIS — J8410 Pulmonary fibrosis, unspecified: Secondary | ICD-10-CM | POA: Diagnosis not present

## 2015-11-30 DIAGNOSIS — Z86718 Personal history of other venous thrombosis and embolism: Secondary | ICD-10-CM

## 2015-11-30 DIAGNOSIS — I2510 Atherosclerotic heart disease of native coronary artery without angina pectoris: Secondary | ICD-10-CM | POA: Diagnosis not present

## 2015-11-30 DIAGNOSIS — R079 Chest pain, unspecified: Secondary | ICD-10-CM

## 2015-11-30 LAB — BASIC METABOLIC PANEL
BUN: 6 mg/dL — ABNORMAL LOW (ref 7–25)
CO2: 26 mmol/L (ref 20–31)
Calcium: 9.6 mg/dL (ref 8.6–10.4)
Chloride: 103 mmol/L (ref 98–110)
Creat: 0.74 mg/dL (ref 0.60–0.88)
Glucose, Bld: 93 mg/dL (ref 65–99)
POTASSIUM: 4.5 mmol/L (ref 3.5–5.3)
SODIUM: 139 mmol/L (ref 135–146)

## 2015-11-30 LAB — TROPONIN I

## 2015-11-30 LAB — D-DIMER, QUANTITATIVE (NOT AT ARMC): D DIMER QUANT: 4.2 ug{FEU}/mL — AB (ref 0.00–0.50)

## 2015-11-30 MED ORDER — ASPIRIN EC 81 MG PO TBEC: 81.0000 mg | DELAYED_RELEASE_TABLET | Freq: Every day | ORAL | Status: DC

## 2015-11-30 NOTE — Telephone Encounter (Signed)
DPR ok to lmom . Lmtcb to go over Troponin and D-dimer results. Per Brynda Rim. PA due to D-dimer 4.20 pt needs Chest CT PE Protocol. I will place order for test, and try to reach pt again tonight before I leave tonight.

## 2015-11-30 NOTE — Progress Notes (Signed)
DPR ok to lmom . Lmtcb to go over Troponin and D-dimer results. Per Brynda Rim. PA due to D-dimer 4.20 pt needs Chest CT PE Protocol. I will place order for test, and try to reach pt again tonight before I leave tonight.

## 2015-11-30 NOTE — Patient Instructions (Addendum)
Medication Instructions:  1. START ASPIRIN 81 MG DAILY Labwork: 1. TODAY STAT TROPONIN , STAT D -DIMER; BMET Testing/Procedures: 1. Your physician has requested that you have a lexiscan myoview. For further information please visit [email or web address]. Please follow instruction sheet, as given. 2. A chest x-ray takes a picture of the organs and structures inside the chest, including the heart, lungs, and blood vessels. This test can show several things, including, whether the heart is enlarges; whether fluid is building up in the lungs; and whether pacemaker / defibrillator leads are still in place. Follow-Up: DR. Angelena Form IN 1 MONTH Any Other Special Instructions Will Be Listed Below (If Applicable). If you need a refill on your cardiac medications before your next appointment, please call your pharmacy.

## 2015-11-30 NOTE — Progress Notes (Signed)
Cardiology Office Note:    Date:  11/30/2015   ID:  [name] [name], [name] [name] 1931-05-15, MRN [id number]  PCP:  Hoyt Koch, MD  Cardiologist:  Dr. Lauree Chandler   Electrophysiologist:  N/a Pulmonologist: Dr. Lake Bells  Referring MD: Hoyt Koch, *   Chief Complaint  Patient presents with  . Chest Pain    History of Present Illness:     [name] [name] is a 82 y.o. female with a hx of nonobstructive CAD, prior DVT/pulmonary embolism, HL, RA with pulmonary fibrosis. Cardiac catheterization in 12/14 demonstrated mild nonobstructive CAD.  She was high risk for falls and was taken off of Warfarin. She takes Lovenox injections when she travels long distances.  Last seen by Dr. Angelena Form 1/17.   She was in [place], [place] this past weekend for a conference. She had worsening joint pain and was given a shot of Toradol. She woke up the next morning with chest pain. Her chest felt heavy. She has some associated diaphoresis and dyspnea. She did not take nitroglycerin. Her pain lasted about 30 minutes. She was able to go back to sleep. She's had no recurrent chest discomfort. She denies exertional chest symptoms. She has noted increasing dyspnea over the past few months. This is not getting any worse. She sleeps on incline chronically. Denies PND. She does note some increasing ankle edema recently as well as worsening ankle discomfort. She denies any weight gain. She has chronic hematuria. She's had increased cough recently with clear sputum. Denies fevers.   Past Medical History  Diagnosis Date  . Allergic rhinitis   . History of pulmonary embolism   . DVT (deep venous thrombosis) ([place])   . Dyslipidemia   . Pulmonary fibrosis ([place])   . Osteoporosis   . CAD (coronary artery disease)     Mild CAD by cath 2008  . Hyperlipidemia   . DJD (degenerative joint disease)     rheumatoid  . Rheumatoid arthritis ([place])   . Insomnia   . Allergy     Past Surgical History  Procedure  Laterality Date  . Total knee arthroplasty  1997, 2007    Bilateral  . Tonsillectomy  1941, 1951  . Left heart catheterization with coronary angiogram N/A 06/18/2013    Procedure: LEFT HEART CATHETERIZATION WITH CORONARY ANGIOGRAM;  Surgeon: Peter M Martinique, MD;  Location: [facility] CATH LAB;  Service: Cardiovascular;  Laterality: N/A;  . Cataract extraction    . Abdominal hysterectomy  1974  . Appendectomy  1959    Current Medications: Outpatient Prescriptions Prior to Visit  Medication Sig Dispense Refill  . atorvastatin (LIPITOR) 10 MG tablet Take 1 tablet (10 mg total) by mouth daily. 30 tablet 11  . cetirizine (ZYRTEC) 10 MG tablet Take 5-10 mg by mouth daily as needed for allergies.    . Cholecalciferol (VITAMIN D3) 2000 UNITS capsule Take 1 capsule (2,000 Units total) by mouth daily. 100 capsule 3  . enoxaparin (LOVENOX) 30 MG/0.3ML injection Inject 0.3 mLs (30 mg total) into the skin every 12 (twelve) hours. 0.6 mL 2  . fluticasone (FLONASE) 50 MCG/ACT nasal spray Place 2 sprays into both nostrils daily as needed for allergies or rhinitis. 16 g 3  . HYDROcodone-homatropine (HYCODAN) 5-1.5 MG/5ML syrup Take 5 mLs by mouth every 6 (six) hours as needed for cough.    . latanoprost (XALATAN) 0.005 % ophthalmic solution Place 1 drop into both eyes at bedtime.   0  . Polyethyl Glycol-Propyl Glycol (SYSTANE) 0.4-0.3 % SOLN  Apply 1 drop to eye daily as needed (for dry eyes).    . ALPRAZolam (XANAX) 0.5 MG tablet Take 0.5 tablets (0.25 mg total) by mouth as needed for anxiety. (Patient not taking: Reported on 11/30/2015) 30 tablet 0  . predniSONE (DELTASONE) 10 MG tablet 1 po qam pc (Patient not taking: Reported on 11/30/2015) 30 tablet 2   No facility-administered medications prior to visit.      Allergies:   Crestor; Lactose intolerance (gi); Penicillins; and Niacin   Social History   Social History  . Marital Status: Widowed    Spouse Name: N/A  . Number of Children: 2  . Years of  Education: N/A   Occupational History  . government Designer, television/film set     retired   Social History Main Topics  . Smoking status: Former Smoker -- 0.30 packs/day for 20 years    Types: Cigarettes    Quit date: 07/11/1973  . Smokeless tobacco: Never Used  . Alcohol Use: 0.0 oz/week     Comment: approx 1 glass wine a month  . Drug Use: No  . Sexual Activity: No   Other Topics Concern  . None   Social History Narrative   Diet:   Do you drink/eat things with caffeine? Yes   Marital status:        Widowed                      What year were you married?1954   Do you live in a house, apartment, assisted living, condo, trailer, etc)? House   Is it one or more stories? 1 1/4   How many persons live in your home?1   Do you have any pets in your home? No   Current or past profession:  Publishing rights manager   Do you exercise?        Yes                                            Type & how often:  Walk                                    Do you have a living will? Yes   Do you have a DNR Form? Yes   Do you have a POA/HPOA forms?  Yes        Family History:  The patient's family history includes Alzheimer's disease in her sister and sister; Cancer in her daughter; Heart attack (age of onset: 21) in her father; Heart disease in her sister; Kidney disease (age of onset: 35) in her daughter.   ROS:   Please see the history of present illness.    Review of Systems  Constitution: Positive for decreased appetite, malaise/fatigue, night sweats and weight loss.  Cardiovascular: Positive for chest pain, dyspnea on exertion and leg swelling.  Respiratory: Positive for cough and shortness of breath.   Musculoskeletal: Positive for joint pain.  Gastrointestinal: Positive for nausea and vomiting.  Genitourinary: Positive for hematuria.  Neurological: Positive for dizziness and loss of balance.   All other systems reviewed and are negative.   Physical Exam:    VS:  BP 100/68 mmHg   Pulse 94  Ht 5\' 4"  (1.626 m)  Wt 131 lb 1.9 oz (59.476 kg)  BMI 22.50 kg/m2  SpO2 96%   GEN: Well nourished, well developed, in no acute distress HEENT: normal Neck: no JVD, no masses Cardiac: Normal S1/S2, RRR; no murmurs, rubs, or gallops, trace bilateral ankle edema;   Respiratory:  Decreased breath sounds bilaterally, crackles noted R base, no wheezing GI: soft, nontender, nondistended MS: no deformity or atrophy Skin: warm and dry Neuro: No focal deficits  Psych: Alert and oriented x 3, normal affect  Wt Readings from Last 3 Encounters:  11/30/15 131 lb 1.9 oz (59.476 kg)  07/23/15 138 lb (62.596 kg)  07/23/15 138 lb 9.6 oz (62.869 kg)      Studies/Labs Reviewed:     EKG:  EKG is  ordered today.  The ekg ordered today demonstrates NSR, HR 90, normal axis, no ST changes, QTc 428 ms, no change from prior tracing.  Recent Labs: 03/17/2015: BUN 7; Creatinine, Ser 0.80; Hemoglobin 14.5; Platelets 221.0; Potassium 4.6; Sodium 139 10/13/2015: ALT 15   Recent Lipid Panel    Component Value Date/Time   CHOL 204* 10/13/2015 0945   TRIG 129 10/13/2015 0945   HDL 65 10/13/2015 0945   CHOLHDL 3.1 10/13/2015 0945   VLDL 26 10/13/2015 0945   LDLCALC 113 10/13/2015 0945    Additional studies/ records that were reviewed today include:   LHC 06/18/13 Left mainstem: Normal Left anterior descending (LAD): Mild calcification. 30% disease in the mid vessel at the take off of the first diagonal. Left circumflex (LCx): 30% disease in the mid vessel. Right coronary artery (RCA): Mild calcification. Mild disease in the proximal and mid vessel to 20%. Left ventriculography: Left ventricular systolic function is normal, LVEF is estimated at 55-65%, there is no significant mitral regurgitation  Final Conclusions:  1. Nonobstructive CAD 2. Normal LV function  Myoview 8/13 Overall Impression:  Normal stress nuclear study.  LV Ejection Fraction: 85%.  LV Wall Motion:  NL LV Function; NL  Wall Motion  Echo 8/13 EF 55-60%, no RWMA, Gr 1 DD, mild reduced RVSF, PASP 26 mmHg   ASSESSMENT:     1. Precordial pain   2. Coronary artery disease involving native coronary artery of native heart without angina pectoris   3. Personal history of DVT (deep vein thrombosis)   4. Postinflammatory pulmonary fibrosis (HCC)     PLAN:     In order of problems listed above:  1. Chest pain - Etiology of her chest symptoms or not clear. She has typical and atypical features. Cardiac catheterization 2 years ago demonstrated no significant CAD other than mild nonobstructive disease.  She would be low risk for progressive obstructive CAD. ECG is unchanged.  She does have a history of prior pulmonary emboli. She takes prophylactic Lovenox prior to long trips. She usually does not have to take Lovenox for trips such as going to [place] which was only about an hour plane ride. However, given her history, she is certainly at risk. Luckily, her O2 sats are normal and she is not tachycardic.  -  Obtain stat BMET, d-dimer, troponin  -  If troponin is abnormal, she will be sent to the emergency room  -  If d-dimer elevated, she will need chest CTA to rule out recurrent pulmonary embolism  -  Arrange chest x-ray  -  Arrange Lexiscan Myoview   2. CAD - Non-obs CAD by cath in 2014. As noted, she does have typical and atypical features. The likelihood that she has developed obstructive coronary artery disease is quite low. However,  given her symptoms, I will obtain a troponin. If her troponin is negative, she will proceed with outpatient Lexiscan Myoview. She is not currently taking aspirin. I have asked her to resume aspirin 81 mg daily. Continue statin.   3. Hx of DVT - Continue prophylactic Lovenox prn long trips as planned.   4. Pulmonary Fibrosis - FU with Pulmonology as planned.  If cardiac workup unremarkable, she will need to FU with Pulmonology sooner, especially given her cough.     Medication  Adjustments/Labs and Tests Ordered: Current medicines are reviewed at length with the patient today.  Concerns regarding medicines are outlined above.  Medication changes, Labs and Tests ordered today are outlined in the Patient Instructions noted below. Patient Instructions  Medication Instructions:  1. START ASPIRIN 81 MG DAILY Labwork: 1. TODAY STAT TROPONIN , STAT D -DIMER; BMET Testing/Procedures: 1. Your physician has requested that you have a lexiscan myoview. For further information please visit [email or web address]. Please follow instruction sheet, as given. 2. A chest x-ray takes a picture of the organs and structures inside the chest, including the heart, lungs, and blood vessels. This test can show several things, including, whether the heart is enlarges; whether fluid is building up in the lungs; and whether pacemaker / defibrillator leads are still in place. Follow-Up: DR. Angelena Form IN 1 MONTH Any Other Special Instructions Will Be Listed Below (If Applicable). If you need a refill on your cardiac medications before your next appointment, please call your pharmacy.    Signed, Richardson Dopp, PA-C  11/30/2015 5:25 PM    [facility] [place], [place], [place]  [address] Phone: [phone]; Fax: [phone]

## 2015-11-30 NOTE — Telephone Encounter (Signed)
New message  Pt c/o of Chest Pain: STAT if CP now or developed within 24 hours  1. Are you having CP right now? NO  2. Are you experiencing any other symptoms (ex. SOB, nausea, vomiting, sweating)? SOB, nausea, vomiting, sweating   3. How long have you been experiencing CP? MONTH 4. Is your CP continuous or coming and going? COMING AND GOING 5. Have you taken Nitroglycerin? NO ?

## 2015-11-30 NOTE — Telephone Encounter (Signed)
Spoke with pt. She reports she was out of town on Saturday and woke up with chest pain. This lasted about an hour. Went away on it's own. Vomited one time with this pain. Sweaty with this episode but she reports she is frequently sweaty and this is not new for her. Rested in bed all day.  Returned home on Sunday. No pain since except she notes occasional twitches every now and then.  She has had these in the past and thinks it may be related to her pulmonary fibrosis.  She is concerned about recent chest pain. Today she feels weak but otherwise back to normal.  She can be here for appt this morning. Appt made for pt to see Richardson Dopp, PA today at 11:50

## 2015-12-01 ENCOUNTER — Telehealth: Payer: Self-pay | Admitting: *Deleted

## 2015-12-01 ENCOUNTER — Ambulatory Visit
Admission: RE | Admit: 2015-12-01 | Discharge: 2015-12-01 | Disposition: A | Discharge status: Home / Self Care | Payer: Medicare Other | Source: Ambulatory Visit | Attending: Physician Assistant | Admitting: Physician Assistant

## 2015-12-01 ENCOUNTER — Ambulatory Visit (INDEPENDENT_AMBULATORY_CARE_PROVIDER_SITE_OTHER)
Admission: RE | Admit: 2015-12-01 | Discharge: 2015-12-01 | Disposition: A | Discharge status: Home / Self Care | Payer: Medicare Other | Source: Ambulatory Visit | Attending: Physician Assistant | Admitting: Physician Assistant

## 2015-12-01 DIAGNOSIS — R791 Abnormal coagulation profile: Secondary | ICD-10-CM | POA: Diagnosis not present

## 2015-12-01 DIAGNOSIS — R079 Chest pain, unspecified: Secondary | ICD-10-CM | POA: Diagnosis not present

## 2015-12-01 DIAGNOSIS — R072 Precordial pain: Secondary | ICD-10-CM

## 2015-12-01 DIAGNOSIS — R0602 Shortness of breath: Secondary | ICD-10-CM | POA: Diagnosis not present

## 2015-12-01 MED ORDER — IOPAMIDOL (ISOVUE-370) INJECTION 76%
80.0000 mL | Freq: Once | INTRAVENOUS | Status: AC | PRN
  Administered 2015-12-01: 80 mL via INTRAVENOUS

## 2015-12-01 NOTE — Telephone Encounter (Signed)
I tried to reach pt last night to go over D-dimer results, Lmtcb. Lattie Haw in our [place] today was able to reach pt by phone. Pt is scheduled for CT at 10:30 today.

## 2015-12-01 NOTE — Telephone Encounter (Signed)
Pt notified of CT-A and lab results by phone with verbal understanding to results given. Pt also had CXR today, advised I will cb once CXR read. pt said thank you.

## 2015-12-02 ENCOUNTER — Telehealth (HOSPITAL_COMMUNITY): Payer: Self-pay

## 2015-12-02 ENCOUNTER — Telehealth (HOSPITAL_COMMUNITY): Payer: Self-pay | Admitting: *Deleted

## 2015-12-02 NOTE — Telephone Encounter (Signed)
Left message on voicemail per DPR in reference to upcoming appointment scheduled on  12/08/15 with detailed instructions given per Myocardial Perfusion Study Information Sheet for the test. LM to arrive 15 minutes early, and that it is imperative to arrive on time for appointment to keep from having the test rescheduled. If you need to cancel or reschedule your appointment, please call the office within 24 hours of your appointment. Failure to do so may result in a cancellation of your appointment, and a $50 no show fee. Phone number given for call back for any questions. Hubbard Robinson, RN

## 2015-12-02 NOTE — Telephone Encounter (Signed)
Patient given detailed instructions per Myocardial Perfusion Study Information Sheet for the test on 12/08/2015 at 9:45. Patient notified to arrive 15 minutes early and that it is imperative to arrive on time for appointment to keep from having the test rescheduled.  If you need to cancel or reschedule your appointment, please call the office within 24 hours of your appointment. Failure to do so may result in a cancellation of your appointment, and a $50 no show fee. Patient verbalized understanding.ehk

## 2015-12-04 DIAGNOSIS — N3020 Other chronic cystitis without hematuria: Secondary | ICD-10-CM | POA: Diagnosis not present

## 2015-12-04 DIAGNOSIS — Z0000 Encounter for general adult medical examination without abnormal findings: Secondary | ICD-10-CM | POA: Diagnosis not present

## 2015-12-04 DIAGNOSIS — N3281 Overactive bladder: Secondary | ICD-10-CM | POA: Diagnosis not present

## 2015-12-04 DIAGNOSIS — R3121 Asymptomatic microscopic hematuria: Secondary | ICD-10-CM | POA: Diagnosis not present

## 2015-12-08 ENCOUNTER — Ambulatory Visit (HOSPITAL_COMMUNITY): Payer: Medicare Other | Attending: Physician Assistant

## 2015-12-08 DIAGNOSIS — R072 Precordial pain: Secondary | ICD-10-CM | POA: Diagnosis not present

## 2015-12-08 DIAGNOSIS — R0609 Other forms of dyspnea: Secondary | ICD-10-CM | POA: Diagnosis not present

## 2015-12-08 DIAGNOSIS — R079 Chest pain, unspecified: Secondary | ICD-10-CM | POA: Insufficient documentation

## 2015-12-08 LAB — MYOCARDIAL PERFUSION IMAGING
CHL CUP NUCLEAR SRS: 2
CHL CUP NUCLEAR SSS: 5
LHR: 0.39
LV dias vol: 49 mL (ref 46–106)
LVSYSVOL: 13 mL
Peak HR: 111 {beats}/min
Rest HR: 83 {beats}/min
SDS: 3
TID: 0.93

## 2015-12-08 MED ORDER — TECHNETIUM TC 99M TETROFOSMIN IV KIT
32.5000 | PACK | Freq: Once | INTRAVENOUS | Status: AC | PRN
  Administered 2015-12-08: 33 via INTRAVENOUS
  Filled 2015-12-08: qty 33

## 2015-12-08 MED ORDER — TECHNETIUM TC 99M TETROFOSMIN IV KIT
10.1000 | PACK | Freq: Once | INTRAVENOUS | Status: AC | PRN
  Administered 2015-12-08: 10.1 via INTRAVENOUS
  Filled 2015-12-08: qty 10

## 2015-12-08 MED ORDER — TECHNETIUM TC 99M SESTAMIBI GENERIC - CARDIOLITE
32.5000 | Freq: Once | INTRAVENOUS | Status: DC | PRN
  Filled 2015-12-08: qty 33

## 2015-12-08 MED ORDER — REGADENOSON 0.4 MG/5ML IV SOLN
0.4000 mg | Freq: Once | INTRAVENOUS | Status: AC
  Administered 2015-12-08: 0.4 mg via INTRAVENOUS

## 2015-12-09 ENCOUNTER — Encounter: Payer: Self-pay | Admitting: Physician Assistant

## 2015-12-09 ENCOUNTER — Telehealth: Payer: Self-pay | Admitting: *Deleted

## 2015-12-09 NOTE — Telephone Encounter (Signed)
Pt notified of myoview results by phone with verbal understanding 

## 2015-12-11 DIAGNOSIS — M25471 Effusion, right ankle: Secondary | ICD-10-CM | POA: Diagnosis not present

## 2015-12-11 DIAGNOSIS — Z79899 Other long term (current) drug therapy: Secondary | ICD-10-CM | POA: Diagnosis not present

## 2015-12-11 DIAGNOSIS — M79662 Pain in left lower leg: Secondary | ICD-10-CM | POA: Diagnosis not present

## 2015-12-11 DIAGNOSIS — M79661 Pain in right lower leg: Secondary | ICD-10-CM | POA: Diagnosis not present

## 2015-12-11 DIAGNOSIS — R600 Localized edema: Secondary | ICD-10-CM | POA: Diagnosis not present

## 2015-12-11 DIAGNOSIS — M19072 Primary osteoarthritis, left ankle and foot: Secondary | ICD-10-CM | POA: Diagnosis not present

## 2015-12-11 DIAGNOSIS — M19071 Primary osteoarthritis, right ankle and foot: Secondary | ICD-10-CM | POA: Diagnosis not present

## 2015-12-11 DIAGNOSIS — R768 Other specified abnormal immunological findings in serum: Secondary | ICD-10-CM | POA: Diagnosis not present

## 2015-12-11 DIAGNOSIS — J8410 Pulmonary fibrosis, unspecified: Secondary | ICD-10-CM | POA: Diagnosis not present

## 2015-12-11 DIAGNOSIS — M0589 Other rheumatoid arthritis with rheumatoid factor of multiple sites: Secondary | ICD-10-CM | POA: Diagnosis not present

## 2015-12-14 ENCOUNTER — Telehealth: Payer: Self-pay | Admitting: Pulmonary Disease

## 2015-12-14 DIAGNOSIS — M0510 Rheumatoid lung disease with rheumatoid arthritis of unspecified site: Secondary | ICD-10-CM

## 2015-12-14 NOTE — Telephone Encounter (Signed)
Pt states she was told by BQ that anytime she had issues/concerns with her medications that he would help her. Pt had 2 new medications added to her last week by her PCP and has Rheum Arthritis. Pt would like to know if BQ would give her a referral to [facility] to Rheumatology.    BQ Please advise. Thanks.

## 2015-12-14 NOTE — Telephone Encounter (Signed)
Sure; reason: rheumatoid arthritis associated lung disease

## 2015-12-15 NOTE — Telephone Encounter (Signed)
Referral has been placed. Pt is aware. Nothing further was needed.

## 2016-01-05 ENCOUNTER — Ambulatory Visit (INDEPENDENT_AMBULATORY_CARE_PROVIDER_SITE_OTHER): Payer: Medicare Other | Admitting: Cardiovascular Disease

## 2016-01-05 ENCOUNTER — Encounter: Payer: Self-pay | Admitting: Cardiovascular Disease

## 2016-01-05 VITALS — BP 106/58 | HR 96 | Ht 64.5 in | Wt 132.4 lb

## 2016-01-05 DIAGNOSIS — I2510 Atherosclerotic heart disease of native coronary artery without angina pectoris: Secondary | ICD-10-CM

## 2016-01-05 DIAGNOSIS — E785 Hyperlipidemia, unspecified: Secondary | ICD-10-CM | POA: Diagnosis not present

## 2016-01-05 DIAGNOSIS — R600 Localized edema: Secondary | ICD-10-CM | POA: Diagnosis not present

## 2016-01-05 MED ORDER — FUROSEMIDE 20 MG PO TABS: ORAL_TABLET | ORAL | Status: DC

## 2016-01-05 NOTE — Progress Notes (Signed)
Chief Complaint  Patient presents with  . Coronary Artery Disease    denies cp,sob or claudication. Has LEE     History of Present Illness: 82 yo WF with history of CAD, DVT/PE, HLD, pulmonary fibrosis who is here today for cardiac follow up. I saw her 02/27/12 as a new patient for evaluation of chest pains and pain between her shoulder blades. Stress test was normal in August 2013. Known to have mild CAD by cath 2008. Admitted December 2014 with chest pain. She underwent cardiac catheterization on December 9,2014 and found to have 30% mid LAD, 30% mid Circumflex, 20% mid RCA. She has not tolerated statins in the past. Echo 03/06/12 with normal LV size and function with no significant valvular abnormalities. Episode of chest pain after a car trip May 2017. Nuclear stress test May 2017 with no ischemia. Chest CTA without acute PE.   She is here today for f/u. She denies any chest pain over the last few weeks. She does endorse leg swelling to that comes and goes. This is chronic but seems to be worse over the last few weeks.    Primary Care Physician: Hoyt Koch, MD   Past Medical History  Diagnosis Date  . Allergic rhinitis   . History of pulmonary embolism   . DVT (deep venous thrombosis) ([place])   . Dyslipidemia   . Pulmonary fibrosis ([place])   . Osteoporosis   . CAD (coronary artery disease)     Mild CAD by cath 2008  . Hyperlipidemia   . DJD (degenerative joint disease)     rheumatoid  . Rheumatoid arthritis ([place])   . Insomnia   . Allergy   . History of nuclear stress test     Myoview 5/17:  EF 74%, normal perfusion, low risk    Past Surgical History  Procedure Laterality Date  . Total knee arthroplasty  1997, 2007    Bilateral  . Tonsillectomy  1941, 1951  . Left heart catheterization with coronary angiogram N/A 06/18/2013    Procedure: LEFT HEART CATHETERIZATION WITH CORONARY ANGIOGRAM;  Surgeon: Peter M Martinique, MD;  Location: [facility] CATH LAB;  Service: Cardiovascular;   Laterality: N/A;  . Cataract extraction    . Abdominal hysterectomy  1974  . Appendectomy  1959    Current Outpatient Prescriptions  Medication Sig Dispense Refill  . ALPRAZolam (XANAX) 0.25 MG tablet Take 0.25 mg by mouth at bedtime as needed for anxiety.    [name] aspirin EC 81 MG tablet Take 1 tablet (81 mg total) by mouth daily.    [name] atorvastatin (LIPITOR) 10 MG tablet Take 1 tablet (10 mg total) by mouth daily. 30 tablet 11  . cetirizine (ZYRTEC) 10 MG tablet Take 5-10 mg by mouth daily as needed for allergies.    . Cholecalciferol (VITAMIN D3) 2000 UNITS capsule Take 1 capsule (2,000 Units total) by mouth daily. 100 capsule 3  . enoxaparin (LOVENOX) 30 MG/0.3ML injection Inject 0.3 mLs (30 mg total) into the skin every 12 (twelve) hours. 0.6 mL 2  . fluticasone (FLONASE) 50 MCG/ACT nasal spray Place 2 sprays into both nostrils daily as needed for allergies or rhinitis. 16 g 3  . HYDROcodone-homatropine (HYCODAN) 5-1.5 MG/5ML syrup Take 5 mLs by mouth every 6 (six) hours as needed for cough.    . latanoprost (XALATAN) 0.005 % ophthalmic solution Place 1 drop into both eyes at bedtime.   0  . leflunomide (ARAVA) 10 MG tablet Take 10 mg by mouth daily.  0  . Polyethyl Glycol-Propyl Glycol (SYSTANE) 0.4-0.3 % SOLN Apply 1 drop to eye daily as needed (for dry eyes).    . predniSONE (DELTASONE) 5 MG tablet TAKE 1 AND A HALF TABLETS BY MOUTH WITH FOOD OR MILK ONCE DAILY  0  . trimethoprim (TRIMPEX) 100 MG tablet Take 100 mg by mouth daily.   0  . furosemide (LASIX) 20 MG tablet Take one tablet by mouth daily as needed for swelling 15 tablet 6   No current facility-administered medications for this visit.   Facility-Administered Medications Ordered in Other Visits  Medication Dose Route Frequency Provider Last Rate Last Dose  . technetium sestamibi generic (CARDIOLITE) injection 33 milli Curie  33 milli Curie Intravenous Once PRN Josue Hector, MD        Allergies  Allergen Reactions  .  Crestor [Rosuvastatin] Other (See Comments)    Muscle pain  . Lactose Intolerance (Gi) Other (See Comments)    Stomach upset  . Penicillins Hives  . Niacin Hives    Social History   Social History  . Marital Status: Widowed    Spouse Name: N/A  . Number of Children: 2  . Years of Education: N/A   Occupational History  . government Designer, television/film set     retired   Social History Main Topics  . Smoking status: Former Smoker -- 0.30 packs/day for 20 years    Types: Cigarettes    Quit date: 07/11/1973  . Smokeless tobacco: Never Used  . Alcohol Use: 0.0 oz/week     Comment: approx 1 glass wine a month  . Drug Use: No  . Sexual Activity: No   Other Topics Concern  . Not on file   Social History Narrative   Diet:   Do you drink/eat things with caffeine? Yes   Marital status:        Widowed                      What year were you married?1954   Do you live in a house, apartment, assisted living, condo, trailer, etc)? House   Is it one or more stories? 1 1/4   How many persons live in your home?1   Do you have any pets in your home? No   Current or past profession:  Publishing rights manager   Do you exercise?        Yes                                            Type & how often:  Walk                                    Do you have a living will? Yes   Do you have a DNR Form? Yes   Do you have a POA/HPOA forms?  Yes       Family History  Problem Relation Age of Onset  . Kidney disease Daughter 5  . Cancer Daughter   . Heart attack Father 57  . Alzheimer's disease Sister   . Heart disease Sister   . Alzheimer's disease Sister     Review of Systems:  As stated in the HPI and otherwise negative.   BP 106/58 mmHg  Pulse 96  Ht  5' 4.5" (1.638 m)  Wt 132 lb 6.4 oz (60.056 kg)  BMI 22.38 kg/m2  Physical Examination: General: Well developed, well nourished, NAD HEENT: OP clear, mucus membranes moist SKIN: warm, dry. No rashes. Neuro: No focal  deficits Musculoskeletal: Muscle strength 5/5 all ext Psychiatric: Mood and affect normal Neck: No JVD, no carotid bruits, no thyromegaly, no lymphadenopathy. Lungs:Clear bilaterally, no wheezes, rhonci, crackles Cardiovascular: Regular rate and rhythm. No murmurs, gallops or rubs. Abdomen:Soft. Bowel sounds present. Non-tender.  Extremities: Trace bilateral lower extremity edema. Pulses are 2 + in the bilateral DP/PT.  Echo 03/06/12:  Left ventricle: The cavity size was normal. Wall thickness was normal. Systolic function was normal. The estimated ejection fraction was in the range of 55% to 60%. Wall motion was normal; there were no regional wall motion abnormalities. Doppler parameters are consistent with abnormal left ventricular relaxation (grade 1 diastolic dysfunction). - Aortic valve: There was no stenosis. - Mitral valve: No significant regurgitation. - Right ventricle: The cavity size was normal. Systolic function was mildly reduced. - Pulmonary arteries: PA peak pressure: 42mm Hg (S). - Inferior vena cava: The vessel was normal in size; the respirophasic diameter changes were in the normal range (= 50%); findings are consistent with normal central venous pressure. Impressions:  - Normal LV size and systolic function, EF 0000000. Normal RV size with mildly decreased systolic function. Doppler interrogation of TR jet does not suggest pulmonary hypertension. No significant valvular abnormalities.  Cardiac cath 06/18/13: Left mainstem: Normal  Left anterior descending (LAD): Mild calcification. 30% disease in the mid vessel at the take off of the first diagonal.  Left circumflex (LCx): 30% disease in the mid vessel.  Right coronary artery (RCA): Mild calcification. Mild disease in the proximal and mid vessel to 20%.  Left ventriculography: Left ventricular systolic function is normal, LVEF is estimated at 55-65%, there is no significant mitral regurgitation   EKG:  EKG is  ordered today. The ekg ordered today demonstrates NSR, rate 95 bpm. LAE.   Recent Labs: 03/17/2015: Hemoglobin 14.5; Platelets 221.0 10/13/2015: ALT 15 11/30/2015: BUN 6*; Creat 0.74; Potassium 4.5; Sodium 139   Lipid Panel    Component Value Date/Time   CHOL 204* 10/13/2015 0945   TRIG 129 10/13/2015 0945   HDL 65 10/13/2015 0945   CHOLHDL 3.1 10/13/2015 0945   VLDL 26 10/13/2015 0945   LDLCALC 113 10/13/2015 0945     Wt Readings from Last 3 Encounters:  01/05/16 132 lb 6.4 oz (60.056 kg)  11/30/15 131 lb 1.9 oz (59.476 kg)  07/23/15 138 lb (62.596 kg)     Other studies Reviewed: Additional studies/ records that were reviewed today include: . Review of the above records demonstrates:    Assessment and Plan:   1. CAD: She is known to have mild CAD by cath December 2014. Stress myoview May 2017 with no ischemia. Will continue statin, ASA. She does not wish to start a beta blocker.   2. Hyperlipidemia: Continue statin.   3. Bilateral Lower Ext edema: Likely dependent edema. Will [place] Lasix 20 mg prn.   Current medicines are reviewed at length with the patient today.  The patient does not have concerns regarding medicines.  The following changes have been made:  no change  Labs/ tests ordered today include:   No orders of the defined types were placed in this encounter.     Disposition:   FU with me  in 12  months   Signed, Lauree Chandler, MD 01/05/2016  9:54 AM    [facility] [place], [place], [place]  [address] Phone: [phone]; Fax: [phone]

## 2016-01-05 NOTE — Patient Instructions (Signed)
Medication Instructions:  Your physician has recommended you make the following change in your medication:  Start furosemide 20 mg by mouth daily as needed for swelling    Labwork: none  Testing/Procedures: none  Follow-Up: Your physician wants you to follow-up in: 6 months.  You will receive a reminder letter in the mail two months in advance. If you don't receive a letter, please call our office to schedule the follow-up appointment.   Any Other Special Instructions Will Be Listed Below (If Applicable).     If you need a refill on your cardiac medications before your next appointment, please call your pharmacy.

## 2016-01-11 DIAGNOSIS — M0589 Other rheumatoid arthritis with rheumatoid factor of multiple sites: Secondary | ICD-10-CM | POA: Diagnosis not present

## 2016-01-11 DIAGNOSIS — R768 Other specified abnormal immunological findings in serum: Secondary | ICD-10-CM | POA: Diagnosis not present

## 2016-01-11 DIAGNOSIS — J8410 Pulmonary fibrosis, unspecified: Secondary | ICD-10-CM | POA: Diagnosis not present

## 2016-01-11 DIAGNOSIS — Z79899 Other long term (current) drug therapy: Secondary | ICD-10-CM | POA: Diagnosis not present

## 2016-01-11 DIAGNOSIS — E559 Vitamin D deficiency, unspecified: Secondary | ICD-10-CM | POA: Diagnosis not present

## 2016-01-11 DIAGNOSIS — M810 Age-related osteoporosis without current pathological fracture: Secondary | ICD-10-CM | POA: Diagnosis not present

## 2016-01-14 ENCOUNTER — Other Ambulatory Visit: Payer: Self-pay | Admitting: Pulmonary Disease

## 2016-01-14 DIAGNOSIS — R0600 Dyspnea, unspecified: Secondary | ICD-10-CM

## 2016-01-15 ENCOUNTER — Encounter: Payer: Self-pay | Admitting: Pulmonary Disease

## 2016-01-15 ENCOUNTER — Ambulatory Visit (INDEPENDENT_AMBULATORY_CARE_PROVIDER_SITE_OTHER): Payer: Medicare Other | Admitting: Pulmonary Disease

## 2016-01-15 VITALS — BP 126/68 | HR 80 | Ht 65.0 in | Wt 132.0 lb

## 2016-01-15 DIAGNOSIS — M069 Rheumatoid arthritis, unspecified: Secondary | ICD-10-CM | POA: Diagnosis not present

## 2016-01-15 DIAGNOSIS — J8410 Pulmonary fibrosis, unspecified: Secondary | ICD-10-CM

## 2016-01-15 DIAGNOSIS — R0600 Dyspnea, unspecified: Secondary | ICD-10-CM

## 2016-01-15 DIAGNOSIS — I2510 Atherosclerotic heart disease of native coronary artery without angina pectoris: Secondary | ICD-10-CM

## 2016-01-15 LAB — PULMONARY FUNCTION TEST
DL/VA % pred: 47 %
DL/VA: 2.33 ml/min/mmHg/L
DLCO UNC: 8.29 ml/min/mmHg
DLCO cor % pred: 31 %
DLCO cor: 8.15 ml/min/mmHg
DLCO unc % pred: 32 %
FEF 25-75 POST: 0.72 L/s
FEF 25-75 Pre: 1.39 L/sec
FEF2575-%Change-Post: -48 %
FEF2575-%Pred-Post: 57 %
FEF2575-%Pred-Pre: 110 %
FEV1-%CHANGE-POST: -14 %
FEV1-%PRED-PRE: 105 %
FEV1-%Pred-Post: 90 %
FEV1-POST: 1.41 L
FEV1-PRE: 1.65 L
FEV1FVC-%CHANGE-POST: 0 %
FEV1FVC-%PRED-PRE: 103 %
FEV6-%CHANGE-POST: -15 %
FEV6-%PRED-POST: 94 %
FEV6-%Pred-Pre: 110 %
FEV6-PRE: 2.13 L
FEV6-Post: 1.81 L
FEV6FVC-%PRED-POST: 104 %
FEV6FVC-%PRED-PRE: 104 %
FVC-%Change-Post: -15 %
FVC-%Pred-Post: 90 %
FVC-%Pred-Pre: 106 %
FVC-Post: 1.81 L
FVC-Pre: 2.13 L
POST FEV1/FVC RATIO: 78 %
POST FEV6/FVC RATIO: 100 %
Pre FEV1/FVC ratio: 78 %
Pre FEV6/FVC Ratio: 100 %
RV % PRED: 70 %
RV: 1.78 L
TLC % PRED: 72 %
TLC: 3.79 L

## 2016-01-15 NOTE — Assessment & Plan Note (Signed)
I'm pleased to see that there has been no objective evidence of progression of her RA associated interstitial lung disease.  Her PFTs are stable and her oxygenation was normal while walking 324 m and her 6 minute walk tests today. Though the 6 no walk distance is slightly decreased compared to before I don't think this is quite as significant as the fact that her oxygenation has actually improved.  Plan: Continue monitoring without increasing immunosuppression Her function test in the year Follow-up six-month

## 2016-01-15 NOTE — Progress Notes (Signed)
PFT done today. 

## 2016-01-15 NOTE — Patient Instructions (Signed)
We will refer you to [facility] rheumatology Stay active, exercise regularly We will see you back in 6 months or sooner if needed

## 2016-01-15 NOTE — Assessment & Plan Note (Signed)
This seems to be stable on low-dose prednisone but she is frustrated with the side effects. Specifically, she says that she feels less clearheaded when taking prednisone. She would like referral to another rheumatologist.  Plan: Referral to [facility] rheumatology

## 2016-01-15 NOTE — Addendum Note (Signed)
Addended by: Inge Rise on: 01/15/2016 04:57 PM   Modules accepted: Orders

## 2016-01-15 NOTE — Progress Notes (Signed)
Subjective:    Patient ID: [name] [name], female    DOB: Oct 06, 1930, 82 y.o.   MRN: [id number]  Synopsis: This is a very pleasant female who is diagnosed with interstitial lung disease in her mid 5s after evaluation at [facility] and [facility]. She was found to have a UIP pattern on CT scan and serology testing revealed a very high rheumatoid factor as well as anti-CCP antibody. She was followed by Dr. Elgie Collard at [facility] who felt like she had rheumatoid arthritis associated interstitial lung disease. He never treated her with immunosuppressive therapy. From 2000 07/09/2013 she's continued to follow in both [place] and due to her lung function has remained stable during that time. She has not developed new shortness of breath. She has had repeated episodes of pulmonary emboli. The most recent was in the summer of 2014 when she developed her fourth pulmonary embolism after returning from a trip to [place].   HPI Chief Complaint  Patient presents with  . Follow-up    review PFT and 75mw.  Pt has intermittent SOB with exertion, but states overall she is doing well.     [name] [name] has been doing fairly well lately.  Her breathing has been better lately compared to the last visit.  She still has dyspnea with heavy exercise or if she does "excessive work at home" like moving furniture.   Past Medical History  Diagnosis Date  . Allergic rhinitis   . History of pulmonary embolism   . DVT (deep venous thrombosis) ([place])   . Dyslipidemia   . Pulmonary fibrosis ([place])   . Osteoporosis   . CAD (coronary artery disease)     Mild CAD by cath 2008  . Hyperlipidemia   . DJD (degenerative joint disease)     rheumatoid  . Rheumatoid arthritis ([place])   . Insomnia   . Allergy   . History of nuclear stress test     Myoview 5/17:  EF 74%, normal perfusion, low risk       Review of Systems  Constitutional: Negative for fever, chills and fatigue.  HENT:  Negative for congestion, postnasal drip and rhinorrhea.   Respiratory: Positive for cough and chest tightness. Negative for shortness of breath and wheezing.   Cardiovascular: Negative for chest pain, palpitations and leg swelling.       Objective:   Physical Exam  Filed Vitals:   01/15/16 1637  BP: 126/68  Pulse: 80  Height: 5\' 5"  (1.651 m)  Weight: 132 lb (59.875 kg)  SpO2: 98%  RA    Gen: well appearing, no acute distress HEENT: NCAT, PERRL, EOMi,  PULM: Crackles in bases CV: RRR, no mgr, no JVD AB: BS+, soft, nontender, no hsm Ext: warm, trace bilateral edema, no clubbing, no cyanosis  July 2017 pulmonary function testing FVC 1.81 L 90% predicted, total lung capacity 3.79 L 72% predicted, DLCO 8.29 32% predicted. July 2017 6 minute walk distance 324 m, O2 saturation nadir 93% on room air     Assessment & Plan:   Postinflammatory pulmonary fibrosis (HCC) I'm pleased to see that there has been no objective evidence of progression of her RA associated interstitial lung disease.  Her PFTs are stable and her oxygenation was normal while walking 324 m and her 6 minute walk tests today. Though the 6 no walk distance is slightly decreased compared to before I don't think this is quite as significant as the fact that her oxygenation has actually  improved.  Plan: Continue monitoring without increasing immunosuppression Her function test in the year Follow-up six-month  RA (rheumatoid arthritis) This seems to be stable on low-dose prednisone but she is frustrated with the side effects. Specifically, she says that she feels less clearheaded when taking prednisone. She would like referral to another rheumatologist.  Plan: Referral to [facility] rheumatology   > 50% of today's visit 27 minutes spent in direct consultation  Updated Medication List Outpatient Encounter Prescriptions as of 01/15/2016  Medication Sig  . ALPRAZolam (XANAX) 0.25 MG tablet Take 0.25 mg by mouth at  bedtime as needed for anxiety.  [name] aspirin EC 81 MG tablet Take 1 tablet (81 mg total) by mouth daily.  [name] atorvastatin (LIPITOR) 10 MG tablet Take 1 tablet (10 mg total) by mouth daily.  . cetirizine (ZYRTEC) 10 MG tablet Take 5-10 mg by mouth daily as needed for allergies.  [name] enoxaparin (LOVENOX) 30 MG/0.3ML injection Inject 0.3 mLs (30 mg total) into the skin every 12 (twelve) hours.  . fluticasone (FLONASE) 50 MCG/ACT nasal spray Place 2 sprays into both nostrils daily as needed for allergies or rhinitis.  . furosemide (LASIX) 20 MG tablet Take one tablet by mouth daily as needed for swelling  . latanoprost (XALATAN) 0.005 % ophthalmic solution Place 1 drop into both eyes at bedtime.   [name] leflunomide (ARAVA) 10 MG tablet Take 10 mg by mouth daily.   [name] Glycol-Propyl Glycol (SYSTANE) 0.4-0.3 % SOLN Apply 1 drop to eye daily as needed (for dry eyes).  . predniSONE (DELTASONE) 5 MG tablet TAKE 1 AND A HALF TABLETS BY MOUTH WITH FOOD OR MILK ONCE DAILY  . trimethoprim (TRIMPEX) 100 MG tablet Take 100 mg by mouth daily.   . Cholecalciferol (VITAMIN D3) 2000 UNITS capsule Take 1 capsule (2,000 Units total) by mouth daily. (Patient not taking: Reported on 01/15/2016)  . [DISCONTINUED] HYDROcodone-homatropine (HYCODAN) 5-1.5 MG/5ML syrup Take 5 mLs by mouth every 6 (six) hours as needed for cough. Reported on 01/15/2016   Facility-Administered Encounter Medications as of 01/15/2016  Medication  . technetium sestamibi generic (CARDIOLITE) injection [place]

## 2016-02-23 ENCOUNTER — Encounter: Payer: Self-pay | Admitting: Physician Assistant

## 2016-02-23 ENCOUNTER — Other Ambulatory Visit: Payer: Self-pay | Admitting: Physician Assistant

## 2016-02-23 ENCOUNTER — Ambulatory Visit (INDEPENDENT_AMBULATORY_CARE_PROVIDER_SITE_OTHER): Payer: Medicare Other | Admitting: Physician Assistant

## 2016-02-23 ENCOUNTER — Ambulatory Visit (INDEPENDENT_AMBULATORY_CARE_PROVIDER_SITE_OTHER): Payer: Medicare Other

## 2016-02-23 VITALS — BP 140/80 | HR 91 | Temp 98.3°F | Resp 18 | Ht 65.0 in | Wt 129.0 lb

## 2016-02-23 DIAGNOSIS — R079 Chest pain, unspecified: Secondary | ICD-10-CM

## 2016-02-23 DIAGNOSIS — K219 Gastro-esophageal reflux disease without esophagitis: Secondary | ICD-10-CM

## 2016-02-23 DIAGNOSIS — I2510 Atherosclerotic heart disease of native coronary artery without angina pectoris: Secondary | ICD-10-CM | POA: Diagnosis not present

## 2016-02-23 DIAGNOSIS — R600 Localized edema: Secondary | ICD-10-CM

## 2016-02-23 DIAGNOSIS — R0600 Dyspnea, unspecified: Secondary | ICD-10-CM

## 2016-02-23 MED ORDER — RANITIDINE HCL 150 MG PO TABS: 150.0000 mg | ORAL_TABLET | Freq: Two times a day (BID) | ORAL | 0 refills | Status: DC

## 2016-02-23 NOTE — Patient Instructions (Addendum)
I recommend that you take the furosemide (Lasix) each morning for the next 3-5 days. If you are well at that point, you can resume "as needed dosing."  If your symptoms worsen, please see your cardiologist or PCP, and if the chest pain or shortness of breath worsens, please go to the emergency room.    IF you received an x-ray today, you will receive an invoice from [facility]. Please contact [facility] at [phone] with questions or concerns regarding your invoice.   IF you received labwork today, you will receive an invoice from [employer]. Please contact [facility] at [phone] with questions or concerns regarding your invoice.   Our billing staff will not be able to assist you with questions regarding bills from these companies.  You will be contacted with the lab results as soon as they are available. The fastest way to get your results is to activate your My Chart account. Instructions are located on the last page of this paperwork. If you have not heard from [place] regarding the results in 2 weeks, please contact this office.

## 2016-02-23 NOTE — Progress Notes (Signed)
Patient ID: [name] [name], female    DOB: September 29, 1930, 82 y.o.   MRN: [id number]  PCP: Hoyt Koch, MD  Subjective:   Chief Complaint  Patient presents with  . Heartburn    HPI Presents for evaluation of heartburn. She wasn't able to reach anyone by phone at her PCP's office, so came here.  She believes that the pain in her chest, "like someone hit me there," is due to reflux. She has had similar symptoms in the past which resulted in hospitalization, and determined to be due to reflux.  Started about 7 days ago, intermittent, relieved with rest. Worsened today, so she came in. Worse with deep breathing. Associated with SOB, but not with eating. OTC ranitidine without benefit.  Her medical history and medications are reviewed with her. She has known heart disease (last visit with Dr. Angelena Form in 6/17) and pulmonary disease (last visit with Dr. Lake Bells in 7/17). After reading the list of potential adverse effects of furosemide, which includes death, she hasn't taken it.    Review of Systems Constitutional: Positive for activity change (decreased), appetite change (decreased), fatigue and unexpected weight change (Losing weight). Negative for chills and fever.  Respiratory: Positive for chest tightness (with activity) and shortness of breath. Negative for cough.   Cardiovascular: Positive for chest pain and leg swelling. Negative for palpitations.  Gastrointestinal: Positive for constipation and nausea. Negative for abdominal pain, diarrhea and vomiting.  Genitourinary: Positive for hematuria (Patient has had for many years). Negative for difficulty urinating, dysuria, frequency and urgency.  Musculoskeletal: Positive for arthralgias (arthritis).  Neurological: Positive for dizziness and numbness (Attributes to arthritis). Negative for syncope and headaches.     Patient Active Problem List   Diagnosis Date Noted  . RA (rheumatoid arthritis) ([place]) 03/31/2015  .  Allergic rhinitis   . Varicose vein 09/11/2014  . Cough 07/21/2014  . Polyuria 07/21/2014  . Chest pain 02/27/2012  . CAD (coronary artery disease) 02/27/2012  . Postinflammatory pulmonary fibrosis ([place]) 06/30/2008  . CONSTIPATION 01/03/2008  . Dyslipidemia 06/16/2007  . Personal history of DVT (deep vein thrombosis) 06/16/2007     Prior to Admission medications   Medication Sig Start Date End Date Taking? Authorizing Provider  ALPRAZolam Duanne Moron) 0.25 MG tablet Take 0.25 mg by mouth at bedtime as needed for anxiety.   Yes Historical Provider, MD  aspirin EC 81 MG tablet Take 1 tablet (81 mg total) by mouth daily. 11/30/15  Yes Scott Joylene Draft, PA-C  atorvastatin (LIPITOR) 10 MG tablet Take 1 tablet (10 mg total) by mouth daily. 07/23/15  Yes Burnell Blanks, MD  cetirizine (ZYRTEC) 10 MG tablet Take 5-10 mg by mouth daily as needed for allergies.   Yes Historical Provider, MD  Cholecalciferol (VITAMIN D3) 2000 UNITS capsule Take 1 capsule (2,000 Units total) by mouth daily. 03/31/15  Yes Evie Lacks Plotnikov, MD  enoxaparin (LOVENOX) 30 MG/0.3ML injection Inject 0.3 mLs (30 mg total) into the skin every 12 (twelve) hours. 04/09/15  Yes Juanito Doom, MD  fluticasone (FLONASE) 50 MCG/ACT nasal spray Place 2 sprays into both nostrils daily as needed for allergies or rhinitis. 03/17/15  Yes Hoyt Koch, MD  furosemide (LASIX) 20 MG tablet Take one tablet by mouth daily as needed for swelling 01/05/16  Yes Burnell Blanks, MD  latanoprost (XALATAN) 0.005 % ophthalmic solution Place 1 drop into both eyes at bedtime.  09/06/14  Yes Historical Provider, MD  leflunomide (ARAVA) 10 MG tablet Take 10  mg by mouth daily.  11/23/15  Yes Historical Provider, MD  Polyethyl Glycol-Propyl Glycol (SYSTANE) 0.4-0.3 % SOLN Apply 1 drop to eye daily as needed (for dry eyes).   Yes Historical Provider, MD  predniSONE (DELTASONE) 5 MG tablet TAKE 1 AND A HALF TABLETS BY MOUTH WITH FOOD OR MILK  ONCE DAILY 11/23/15  Yes Historical Provider, MD  trimethoprim (TRIMPEX) 100 MG tablet Take 100 mg by mouth daily.  11/23/15  Yes Historical Provider, MD     Allergies  Allergen Reactions  . Crestor [Rosuvastatin] Other (See Comments)    Muscle pain  . Lactose Intolerance (Gi) Other (See Comments)    Stomach upset  . Penicillins Hives  . Niacin Hives       Objective:  Physical Exam  Constitutional: She is oriented to person, place, and time. She appears well-developed and well-nourished. She is active and cooperative. No distress.  BP 140/80 (BP Location: Right Arm, Patient Position: Sitting, Cuff Size: Small)   Pulse 91   Temp 98.3 F (36.8 C) (Oral)   Resp 18   Ht 5\' 5"  (1.651 m)   Wt 129 lb (58.5 kg)   SpO2 98%   BMI 21.47 kg/m   HENT:  Head: Normocephalic and atraumatic.  Right Ear: Hearing normal.  Left Ear: Hearing normal.  Eyes: Conjunctivae are normal. No scleral icterus.  Neck: Normal range of motion. Neck supple. No thyromegaly present.  Cardiovascular: Normal rate, regular rhythm and normal heart sounds.   Pulses:      Radial pulses are 2+ on the right side, and 2+ on the left side.  Bilateral LE edema, R>L, at the ankles  Pulmonary/Chest: Effort normal and breath sounds normal.    Abdominal: Normal appearance and bowel sounds are normal. She exhibits no distension and no mass. There is no hepatosplenomegaly. There is tenderness in the epigastric area.  Lymphadenopathy:       Head (right side): No tonsillar, no preauricular, no posterior auricular and no occipital adenopathy present.       Head (left side): No tonsillar, no preauricular, no posterior auricular and no occipital adenopathy present.    She has no cervical adenopathy.       Right: No supraclavicular adenopathy present.       Left: No supraclavicular adenopathy present.  Neurological: She is alert and oriented to person, place, and time. No sensory deficit.  Skin: Skin is warm, dry and intact. No  rash noted. No cyanosis or erythema. Nails show no clubbing.  Psychiatric: She has a normal mood and affect. Her speech is normal and behavior is normal.    <ECG>   EKG: normal EKG, normal sinus rhythm. Reviewed with Dr. Tamala Julian    Dg Chest 2 View  Result Date: 02/23/2016 CLINICAL DATA:  Chest pain. EXAM: CHEST  2 VIEW COMPARISON:  12/01/2015 .  05/21/2014 . FINDINGS: Mediastinum and hilar structures normal. Heart size normal. Bilateral mild interstitial prominence is noted. This is unchanged from multiple prior exams and most consistent chronic interstitial lung disease. Superimposed active interstitial lung disease cannot be excluded. Similar findings noted on prior exam. Small bilateral effusions. No pneumothorax. Multilevel degenerative change. IMPRESSION: 1. Chronic bilateral interstitial lung disease. Superimposed active interstitial disease cannot be excluded . Similar findings noted on prior exams . 2. Bilateral pleural scarring. Electronically Signed   By: Marcello Moores  Register   On: 02/23/2016 12:54       Assessment & Plan:   1. Chest pain, unspecified chest pain type 2.  Dyspnea Reassuring EKG and CXR. Suspect reflux as cause. However, given the patient's chronic problems, these symptoms could represent an exacerbation of either cardiac or pulmonary disease. If her symptoms worsen or persist, she will RTC, see her PCP or go to the ED. - EKG 12-Lead - DG Chest 2 View; Future  3. Bilateral lower extremity edema Likely due to volume overload. Encourage her to use the furosemide daily x 3-5 days, then PRN.  4. Gastroesophageal reflux disease, esophagitis presence not specified Increase ranitidine dose and frequency. If symptoms persist, re-evaluate as above. - ranitidine (ZANTAC) 150 MG tablet; Take 1 tablet (150 mg total) by mouth 2 (two) times daily.  Dispense: 60 tablet; Refill: 0   Fara Chute, PA-C Physician Assistant-Certified [facility]

## 2016-02-23 NOTE — Progress Notes (Signed)
Subjective:    Patient ID: [name] [name], female    DOB: [birth year], 82 y.o.   MRN: [id number]  HPI  Patient presents today for evaluation of what she believes is acid reflux. She has been treated for acid reflux in the past, and was actually hospitalized, but does not remember what she was treated with then. She describes the sensation as feeling "like someone hit me there, and it gets worse when I breathe." The discomfort started about 1 week ago, and has been intermittent in nature and worsened yesterday. She also experiences SOB with this. She does not necessarily notice any association with eating, although she has been eating less. She has tried zantac OTC but this has not relieved her symptoms. Typically her symptoms are relieved with rest, but this current episode has been going on since yesterday.  Patient has not wanted to take her lasix recently, because a listed side effect was "death". Explained to patient that it is unlikely that taking this medication would cause her to suddenly die. Discussed that major concerns with this medication are dehydration and imbalances in potassium, and talked about what kind of symptoms to watch for.  Review of Systems  Constitutional: Positive for activity change (decreased), appetite change (decreased), fatigue and unexpected weight change (Losing weight). Negative for chills and fever.  Respiratory: Positive for chest tightness (with activity) and shortness of breath. Negative for cough.   Cardiovascular: Positive for chest pain and leg swelling. Negative for palpitations.  Gastrointestinal: Positive for constipation and nausea. Negative for abdominal pain, diarrhea and vomiting.  Genitourinary: Positive for hematuria (Patient has had for many years). Negative for difficulty urinating, dysuria, frequency and urgency.  Musculoskeletal: Positive for arthralgias (arthritis).  Neurological: Positive for dizziness and numbness (Attributes to arthritis).  Negative for syncope and headaches.     Current Outpatient Prescriptions:  .  ALPRAZolam (XANAX) 0.25 MG tablet, Take 0.25 mg by mouth at bedtime as needed for anxiety., Disp: , Rfl:  .  aspirin EC 81 MG tablet, Take 1 tablet (81 mg total) by mouth daily., Disp: , Rfl:  .  atorvastatin (LIPITOR) 10 MG tablet, Take 1 tablet (10 mg total) by mouth daily., Disp: 30 tablet, Rfl: 11 .  cetirizine (ZYRTEC) 10 MG tablet, Take 5-10 mg by mouth daily as needed for allergies., Disp: , Rfl:  .  Cholecalciferol (VITAMIN D3) 2000 UNITS capsule, Take 1 capsule (2,000 Units total) by mouth daily., Disp: 100 capsule, Rfl: 3 .  enoxaparin (LOVENOX) 30 MG/0.3ML injection, Inject 0.3 mLs (30 mg total) into the skin every 12 (twelve) hours., Disp: 0.6 mL, Rfl: 2 .  fluticasone (FLONASE) 50 MCG/ACT nasal spray, Place 2 sprays into both nostrils daily as needed for allergies or rhinitis., Disp: 16 g, Rfl: 3 .  furosemide (LASIX) 20 MG tablet, Take one tablet by mouth daily as needed for swelling, Disp: 15 tablet, Rfl: 6 .  latanoprost (XALATAN) 0.005 % ophthalmic solution, Place 1 drop into both eyes at bedtime. , Disp: , Rfl: 0 .  leflunomide (ARAVA) 10 MG tablet, Take 10 mg by mouth daily. , Disp: , Rfl: 0 .  Polyethyl Glycol-Propyl Glycol (SYSTANE) 0.4-0.3 % SOLN, Apply 1 drop to eye daily as needed (for dry eyes)., Disp: , Rfl:  .  predniSONE (DELTASONE) 5 MG tablet, TAKE 1 AND A HALF TABLETS BY MOUTH WITH FOOD OR MILK ONCE DAILY, Disp: , Rfl: 0 .  trimethoprim (TRIMPEX) 100 MG tablet, Take 100 mg by mouth daily. ,  Disp: , Rfl: 0 No current facility-administered medications for this visit.   Facility-Administered Medications Ordered in Other Visits:  .  technetium sestamibi generic (CARDIOLITE) injection 33 milli Curie, 33 millicurie, Intravenous, Once PRN, Josue Hector, MD   Allergies  Allergen Reactions  . Crestor [Rosuvastatin] Other (See Comments)    Muscle pain  . Lactose Intolerance (Gi) Other (See  Comments)    Stomach upset  . Penicillins Hives  . Niacin Hives      Objective:   Physical Exam  Constitutional: She is oriented to person, place, and time. She appears well-developed and well-nourished. She is cooperative. No distress.  Blood pressure 140/80, pulse 91, temperature 98.3 F (36.8 C), temperature source Oral, resp. rate 18, height 5\' 5"  (1.651 m), weight 129 lb (58.5 kg), SpO2 98 %.  HENT:  Head: Normocephalic and atraumatic.  Eyes: Conjunctivae and lids are normal. No scleral icterus.  Neck: Neck supple.  Cardiovascular: Normal rate, regular rhythm and normal heart sounds.   Pulses:      Radial pulses are 2+ on the right side, and 2+ on the left side.       Dorsalis pedis pulses are 1+ on the right side, and 1+ on the left side.       Posterior tibial pulses are 1+ on the right side, and 1+ on the left side.  BL leg swelling, worse on the RIGHT. Mild pitting on the RIGHT at the level of the ankle.  Pulmonary/Chest: Effort normal. She has no decreased breath sounds. She has no wheezes. She has no rhonchi. She has rales in the right lower field and the left lower field. She exhibits tenderness (Moderately TTP). She exhibits no mass and no deformity.    Abdominal: Soft. Bowel sounds are normal. She exhibits no distension and no mass. There is tenderness (Moderately TTP) in the epigastric area.    Musculoskeletal: Normal range of motion.       Lumbar back: She exhibits no edema.  Lymphadenopathy:    She has no cervical adenopathy.  Neurological: She is alert and oriented to person, place, and time. She has normal strength. No sensory deficit.  Skin: Skin is warm, dry and intact. She is not diaphoretic. No pallor.  Psychiatric: She has a normal mood and affect. Her speech is normal and behavior is normal. Judgment and thought content normal. Cognition and memory are normal.     Imaging Results CHEST  2 VIEW  COMPARISON:  12/01/2015 .  05/21/2014  .  FINDINGS: Mediastinum and hilar structures normal. Heart size normal. Bilateral mild interstitial prominence is noted. This is unchanged from multiple prior exams and most consistent chronic interstitial lung disease. Superimposed active interstitial lung disease cannot be excluded. Similar findings noted on prior exam. Small bilateral effusions. No pneumothorax. Multilevel degenerative change.  IMPRESSION: 1. Chronic bilateral interstitial lung disease. Superimposed active interstitial disease cannot be excluded . Similar findings noted on prior exams . 2. Bilateral pleural scarring.   Electronically Signed   By: Marcello Moores  Register   On: 02/23/2016 12:54     Assessment & Plan:  1. Chest pain, unspecified chest pain type 2. Dyspnea - EKG 12-Lead WNL - DG Chest 2 View as above  3. Bilateral lower extremity edema - likely related to HTN - advised patient to take her lasix as directed. - discussed with patient the mechanism of lasix and how it helps with the swelling.  4. Gastroesophageal reflux disease, esophagitis presence not specified - with nml EKG  and CXR, think this is the most likely cause of her chest discomfort - ranitidine (ZANTAC) 150 MG tablet; Take 1 tablet (150 mg total) by mouth 2 (two) times daily.  Dispense: 60 tablet; Refill: 0  Discussed with patient that although her chest pain is likely related to GERD this time, that given her PMH, if she continues to have chest pain or any different chest pain that she needs to see someone. Patient verbalized understanding and agreement with plan.

## 2016-03-07 ENCOUNTER — Other Ambulatory Visit: Payer: Self-pay

## 2016-03-08 DIAGNOSIS — R5383 Other fatigue: Secondary | ICD-10-CM | POA: Diagnosis not present

## 2016-03-08 DIAGNOSIS — M2550 Pain in unspecified joint: Secondary | ICD-10-CM | POA: Diagnosis not present

## 2016-03-08 DIAGNOSIS — J849 Interstitial pulmonary disease, unspecified: Secondary | ICD-10-CM | POA: Diagnosis not present

## 2016-03-08 DIAGNOSIS — M79642 Pain in left hand: Secondary | ICD-10-CM | POA: Diagnosis not present

## 2016-03-08 DIAGNOSIS — Z79899 Other long term (current) drug therapy: Secondary | ICD-10-CM | POA: Diagnosis not present

## 2016-03-08 DIAGNOSIS — M79641 Pain in right hand: Secondary | ICD-10-CM | POA: Diagnosis not present

## 2016-03-08 DIAGNOSIS — M0579 Rheumatoid arthritis with rheumatoid factor of multiple sites without organ or systems involvement: Secondary | ICD-10-CM | POA: Diagnosis not present

## 2016-03-23 DIAGNOSIS — M0579 Rheumatoid arthritis with rheumatoid factor of multiple sites without organ or systems involvement: Secondary | ICD-10-CM | POA: Diagnosis not present

## 2016-03-23 DIAGNOSIS — Z79899 Other long term (current) drug therapy: Secondary | ICD-10-CM | POA: Diagnosis not present

## 2016-03-23 DIAGNOSIS — J849 Interstitial pulmonary disease, unspecified: Secondary | ICD-10-CM | POA: Diagnosis not present

## 2016-03-23 DIAGNOSIS — M2550 Pain in unspecified joint: Secondary | ICD-10-CM | POA: Diagnosis not present

## 2016-03-30 DIAGNOSIS — M0579 Rheumatoid arthritis with rheumatoid factor of multiple sites without organ or systems involvement: Secondary | ICD-10-CM | POA: Diagnosis not present

## 2016-03-30 DIAGNOSIS — J849 Interstitial pulmonary disease, unspecified: Secondary | ICD-10-CM | POA: Diagnosis not present

## 2016-03-31 ENCOUNTER — Telehealth: Payer: Self-pay | Admitting: *Deleted

## 2016-03-31 NOTE — Telephone Encounter (Signed)
Dr. Flonnie Overman is requesting to speak with Dr. Lake Bells personally regarding pt ILD and her treatment. Please advise Dr. Lake Bells thanks

## 2016-04-02 NOTE — Telephone Encounter (Signed)
Please remind me on 9/25

## 2016-04-04 ENCOUNTER — Telehealth: Payer: Self-pay | Admitting: Pulmonary Disease

## 2016-04-04 NOTE — Telephone Encounter (Signed)
LVM for pt to return call

## 2016-04-04 NOTE — Telephone Encounter (Signed)
LMOM TCB x1 for Dr Flonnie Overman informing that BQ is here in the office all day w/ clinic and provided the doc line number Will route back to BQ

## 2016-04-04 NOTE — Telephone Encounter (Signed)
Will route message back to BQ to remind him.

## 2016-04-04 NOTE — Telephone Encounter (Signed)
Returned call, no answer, left message.

## 2016-04-04 NOTE — Telephone Encounter (Signed)
I talked to him today

## 2016-04-05 NOTE — Telephone Encounter (Signed)
Pt returned call. She is requesting a call from BQ to discuss the second opinion she received from [place]. She states she is having an infusion tomorrow morning @ 8:30 and the doctors from [facility] informed her that they would contact BQ to verify infusion was appropriate. She states she is unsure what the name of the infusion is at this time. She has not heard from the doctors at [facility] and would like to verify with BQ that he has spoken with the doctors and that the infusion is appropriate. She is requesting a call back at either [phone](Home) or [phone](cell) before this afternoon so that if it needs to be canceled she is able to cancel the infusion without a penalty. I explained to her that I would send the message to BQ. She voiced understanding and had no further questions.   BQ please advise

## 2016-04-05 NOTE — Telephone Encounter (Signed)
Pt returning call.[name] [name] ° °

## 2016-04-06 NOTE — Telephone Encounter (Signed)
Returned call, no answer, left message.

## 2016-04-07 NOTE — Telephone Encounter (Signed)
OK to have the infusion.  Let her know that I discussed her case in detail with her Rheumatology team from [place] earlier this week.

## 2016-04-07 NOTE — Telephone Encounter (Signed)
Pt called back. She is aware Dr. Lake Bells did try calling yesterday. She wants to know if she wait to see Dr. Lake Bells or have the infusion? Please advise Dr. Lake Bells thanks

## 2016-04-07 NOTE — Telephone Encounter (Signed)
Spoke with the pt and notified of recs per BQ  She verbalized understanding and nothing further needed

## 2016-04-07 NOTE — Telephone Encounter (Signed)
Pt returning call wanting to know if she should continue w/ the infusion or wait please advise.[name] [name]

## 2016-04-21 DIAGNOSIS — M0579 Rheumatoid arthritis with rheumatoid factor of multiple sites without organ or systems involvement: Secondary | ICD-10-CM | POA: Diagnosis not present

## 2016-04-29 DIAGNOSIS — H40011 Open angle with borderline findings, low risk, right eye: Secondary | ICD-10-CM | POA: Diagnosis not present

## 2016-04-29 DIAGNOSIS — H04123 Dry eye syndrome of bilateral lacrimal glands: Secondary | ICD-10-CM | POA: Diagnosis not present

## 2016-04-29 DIAGNOSIS — H21233 Degeneration of iris (pigmentary), bilateral: Secondary | ICD-10-CM | POA: Diagnosis not present

## 2016-05-05 DIAGNOSIS — M0579 Rheumatoid arthritis with rheumatoid factor of multiple sites without organ or systems involvement: Secondary | ICD-10-CM | POA: Diagnosis not present

## 2016-05-19 DIAGNOSIS — M2550 Pain in unspecified joint: Secondary | ICD-10-CM | POA: Diagnosis not present

## 2016-05-23 DIAGNOSIS — M0579 Rheumatoid arthritis with rheumatoid factor of multiple sites without organ or systems involvement: Secondary | ICD-10-CM | POA: Diagnosis not present

## 2016-05-23 DIAGNOSIS — Z79899 Other long term (current) drug therapy: Secondary | ICD-10-CM | POA: Diagnosis not present

## 2016-05-23 DIAGNOSIS — J849 Interstitial pulmonary disease, unspecified: Secondary | ICD-10-CM | POA: Diagnosis not present

## 2016-05-23 DIAGNOSIS — M2550 Pain in unspecified joint: Secondary | ICD-10-CM | POA: Diagnosis not present

## 2016-06-01 ENCOUNTER — Ambulatory Visit (INDEPENDENT_AMBULATORY_CARE_PROVIDER_SITE_OTHER): Payer: Medicare Other

## 2016-06-01 ENCOUNTER — Ambulatory Visit (INDEPENDENT_AMBULATORY_CARE_PROVIDER_SITE_OTHER): Payer: Medicare Other | Admitting: Physician Assistant

## 2016-06-01 VITALS — BP 122/72 | HR 86 | Temp 97.8°F | Resp 17 | Ht 63.5 in | Wt 130.0 lb

## 2016-06-01 DIAGNOSIS — M79672 Pain in left foot: Secondary | ICD-10-CM

## 2016-06-01 DIAGNOSIS — M898X6 Other specified disorders of bone, lower leg: Secondary | ICD-10-CM

## 2016-06-01 DIAGNOSIS — M79662 Pain in left lower leg: Secondary | ICD-10-CM

## 2016-06-01 DIAGNOSIS — I2510 Atherosclerotic heart disease of native coronary artery without angina pectoris: Secondary | ICD-10-CM

## 2016-06-01 NOTE — Patient Instructions (Signed)
     IF you received an x-ray today, you will receive an invoice from [facility]. Please contact [facility] at [phone] with questions or concerns regarding your invoice.   IF you received labwork today, you will receive an invoice from [facility]. Please contact [facility] at [phone] with questions or concerns regarding your invoice.   Our billing staff will not be able to assist you with questions regarding bills from these companies.  You will be contacted with the lab results as soon as they are available. The fastest way to get your results is to activate your My Chart account. Instructions are located on the last page of this paperwork. If you have not heard from us regarding the results in 2 weeks, please contact this office.      

## 2016-06-01 NOTE — Progress Notes (Signed)
06/01/2016 2:46 PM   DOB: 31-May-1931 / MRN: [id number]  SUBJECTIVE:  [name] [name] is a 82 y.o. female presenting for left foot pain that started after dropping a fifty pound planter on her foot today. She is able to walk on the foot.  Denies bruising and swelling. She has a history of osteoporosis.     She is allergic to crestor [rosuvastatin]; lactose intolerance (gi); penicillins; and niacin.   She  has a past medical history of Allergic rhinitis; Allergy; CAD (coronary artery disease); DJD (degenerative joint disease); DVT (deep venous thrombosis) ([name] [name]); Dyslipidemia; History of nuclear stress test; History of pulmonary embolism; Hyperlipidemia; Insomnia; Osteoporosis; Pulmonary fibrosis ([name] [name]); and Rheumatoid arthritis ([name] [name]).    She  reports that she quit smoking about 42 years ago. Her smoking use included Cigarettes. She has a 6.00 pack-year smoking history. She has never used smokeless tobacco. She reports that she drinks alcohol. She reports that she does not use drugs. She  reports that she does not engage in sexual activity. The patient  has a past surgical history that includes Total knee arthroplasty (1997, 2007); Tonsillectomy (1941, 1951); left heart catheterization with coronary angiogram (N/A, 06/18/2013); Cataract extraction; Abdominal hysterectomy (1974); and Appendectomy [id number]).  Her family history includes Alzheimer's disease in her sister and sister; Cancer in her daughter; Heart attack (age of onset: 27) in her father; Heart disease in her sister; Kidney disease (age of onset: 30) in her daughter.  Review of Systems  Constitutional: Negative for fever.  Musculoskeletal: Positive for joint pain. Negative for falls and myalgias.  Skin: Positive for rash (abrasion about the anterior talus). Negative for itching.  Neurological: Negative for dizziness.    The problem list and medications were reviewed and updated by myself where necessary and exist elsewhere in the encounter.    OBJECTIVE:  BP 122/72 (BP Location: Right Arm, Patient Position: Sitting, Cuff Size: Normal)   Pulse 86   Temp 97.8 F (36.6 C) (Oral)   Resp 17   Ht 5' 3.5" (1.613 m)   Wt 130 lb (59 kg)   SpO2 98%   BMI 22.67 kg/m   Physical Exam  Constitutional: She is oriented to person, place, and time. She appears well-developed and well-nourished. No distress.  Cardiovascular: Normal rate and regular rhythm.   Pulmonary/Chest: Effort normal and breath sounds normal.  Musculoskeletal: Normal range of motion.       Left ankle: She exhibits normal range of motion, no swelling, no ecchymosis, no deformity, no laceration and normal pulse. Tenderness (distal 1/3 of the fibula). No lateral malleolus, no medial malleolus, no AITFL, no CF ligament, no posterior TFL, no head of 5th metatarsal and no proximal fibula tenderness found.       Feet:  Neurological: She is alert and oriented to person, place, and time.  Skin: Skin is warm and dry. She is not diaphoretic.  Small abrasion about the left anterior talus.   Psychiatric: She has a normal mood and affect.    No results found for this or any previous visit (from the past 72 hour(s)).  Dg Tibia/fibula Left  Result Date: 06/01/2016 CLINICAL DATA:  Not planned on foot today with pain, initial encounter EXAM: LEFT TIBIA AND FIBULA - 2 VIEW COMPARISON:  12/11/2015 FINDINGS: Left knee prosthesis is noted. No acute fracture or dislocation is seen. No gross soft tissue abnormality is noted. IMPRESSION: No acute abnormality noted. Electronically Signed   By: Inez Catalina M.D.   On: 06/01/2016 14:42  Dg Foot 2 Views Left  Result Date: 06/01/2016 CLINICAL DATA:  Dropped plantar on foot with pain, initial encounter EXAM: LEFT FOOT - 2 VIEW COMPARISON:  None. FINDINGS: There is no evidence of fracture or dislocation. There is no evidence of arthropathy or other focal bone abnormality. Soft tissues are unremarkable. IMPRESSION: No acute abnormality noted.  Electronically Signed   By: Inez Catalina M.D.   On: 06/01/2016 14:39    ASSESSMENT AND PLAN  [name] [name] was seen today for foot pain.  Diagnoses and all orders for this visit:  Pain of left fibula: Rads negative for fracture.  Advised RICE and Tylenol.  RTC as needed.  -     DG Tibia/Fibula Left; Future  Left foot pain -     DG Foot 2 Views Left; Future    The patient is advised to call or return to clinic if she does not see an improvement in symptoms, or to seek the care of the closest emergency department if she worsens with the above plan.   Philis Fendt, MHS, PA-C [facility] 06/01/2016 2:46 PM

## 2016-06-06 DIAGNOSIS — M0579 Rheumatoid arthritis with rheumatoid factor of multiple sites without organ or systems involvement: Secondary | ICD-10-CM | POA: Diagnosis not present

## 2016-06-08 DIAGNOSIS — R351 Nocturia: Secondary | ICD-10-CM | POA: Diagnosis not present

## 2016-06-08 DIAGNOSIS — R3129 Other microscopic hematuria: Secondary | ICD-10-CM | POA: Diagnosis not present

## 2016-06-08 DIAGNOSIS — N3020 Other chronic cystitis without hematuria: Secondary | ICD-10-CM | POA: Diagnosis not present

## 2016-06-15 ENCOUNTER — Telehealth: Payer: Self-pay | Admitting: Internal Medicine

## 2016-06-15 NOTE — Telephone Encounter (Signed)
[place] [facility] - Client [place] [facility]  Patient Name: [name] [name]  DOB: 1931/01/10    Initial Comment Caller has question about taking RX , is having UTI s/s    Nurse Assessment  Nurse: Wayne Sever, RN, Tillie Rung Date/Time (Eastern Time): 06/15/2016 10:18:40 AM  Confirm and document reason for call. If symptomatic, describe symptoms. ---Caller states she was prescribed a Sulfa medication for a UTI. She just received the medication and was afraid to take it until she found out if it interacted with all the other medications she takes. Advised her to contact the pharmacy where she got it filled and have them check their database to make sure about interactions. She states understanding and will call back if their is an issue.  Does the patient have any new or worsening symptoms? ---No     Guidelines    Guideline Title Affirmed Question Affirmed Notes       Final Disposition User   Clinical Call Buford, RN, Tillie Rung

## 2016-06-30 ENCOUNTER — Ambulatory Visit: Payer: Medicare Other

## 2016-07-19 DIAGNOSIS — R351 Nocturia: Secondary | ICD-10-CM | POA: Diagnosis not present

## 2016-07-21 ENCOUNTER — Ambulatory Visit (INDEPENDENT_AMBULATORY_CARE_PROVIDER_SITE_OTHER): Payer: Medicare Other

## 2016-07-21 VITALS — BP 122/60 | HR 93 | Ht 64.0 in | Wt 133.1 lb

## 2016-07-21 DIAGNOSIS — Z0000 Encounter for general adult medical examination without abnormal findings: Secondary | ICD-10-CM

## 2016-07-21 NOTE — Progress Notes (Signed)
Pre visit review using our clinic review tool, if applicable. No additional management support is needed unless otherwise documented below in the visit note. 

## 2016-07-21 NOTE — Patient Instructions (Addendum)
Continue to eat heart healthy diet (full of fruits, vegetables, whole grains, lean protein, water--limit salt, fat, and sugar intake) and increase physical activity as tolerated.  Continue doing brain stimulating activities (puzzles, reading, adult coloring books, staying active) to keep memory sharp.   Bring a copy of your advance directives to your next office visit.  Bring name of providers for bone scan and Rheumotologist/Pulmonologist at [facility].     Fall Prevention in the Home Introduction Falls can cause injuries. They can happen to people of all ages. There are many things you can do to make your home safe and to help prevent falls. What can I do on the outside of my home?  Regularly fix the edges of walkways and driveways and fix any cracks.  Remove anything that might make you trip as you walk through a door, such as a raised step or threshold.  Trim any bushes or trees on the path to your home.  Use bright outdoor lighting.  Clear any walking paths of anything that might make someone trip, such as rocks or tools.  Regularly check to see if handrails are loose or broken. Make sure that both sides of any steps have handrails.  Any raised decks and porches should have guardrails on the edges.  Have any leaves, snow, or ice cleared regularly.  Use sand or salt on walking paths during winter.  Clean up any spills in your garage right away. This includes oil or grease spills. What can I do in the bathroom?  Use night lights.  Install grab bars by the toilet and in the tub and shower. Do not use towel bars as grab bars.  Use non-skid mats or decals in the tub or shower.  If you need to sit down in the shower, use a plastic, non-slip stool.  Keep the floor dry. Clean up any water that spills on the floor as soon as it happens.  Remove soap buildup in the tub or shower regularly.  Attach bath mats securely with double-sided non-slip rug tape.  Do not have throw rugs  and other things on the floor that can make you trip. What can I do in the bedroom?  Use night lights.  Make sure that you have a light by your bed that is easy to reach.  Do not use any sheets or blankets that are too big for your bed. They should not hang down onto the floor.  Have a firm chair that has side arms. You can use this for support while you get dressed.  Do not have throw rugs and other things on the floor that can make you trip. What can I do in the kitchen?  Clean up any spills right away.  Avoid walking on wet floors.  Keep items that you use a lot in easy-to-reach places.  If you need to reach something above you, use a strong step stool that has a grab bar.  Keep electrical cords out of the way.  Do not use floor polish or wax that makes floors slippery. If you must use wax, use non-skid floor wax.  Do not have throw rugs and other things on the floor that can make you trip. What can I do with my stairs?  Do not leave any items on the stairs.  Make sure that there are handrails on both sides of the stairs and use them. Fix handrails that are broken or loose. Make sure that handrails are as long as the stairways.  Check any carpeting to make sure that it is firmly attached to the stairs. Fix any carpet that is loose or worn.  Avoid having throw rugs at the top or bottom of the stairs. If you do have throw rugs, attach them to the floor with carpet tape.  Make sure that you have a light switch at the top of the stairs and the bottom of the stairs. If you do not have them, ask someone to add them for you. What else can I do to help prevent falls?  Wear shoes that:  Do not have high heels.  Have rubber bottoms.  Are comfortable and fit you well.  Are closed at the toe. Do not wear sandals.  If you use a stepladder:  Make sure that it is fully opened. Do not climb a closed stepladder.  Make sure that both sides of the stepladder are locked into  place.  Ask someone to hold it for you, if possible.  Clearly mark and make sure that you can see:  Any grab bars or handrails.  First and last steps.  Where the edge of each step is.  Use tools that help you move around (mobility aids) if they are needed. These include:  Canes.  Walkers.  Scooters.  Crutches.  Turn on the lights when you go into a dark area. Replace any light bulbs as soon as they burn out.  Set up your furniture so you have a clear path. Avoid moving your furniture around.  If any of your floors are uneven, fix them.  If there are any pets around you, be aware of where they are.  Review your medicines with your doctor. Some medicines can make you feel dizzy. This can increase your chance of falling. Ask your doctor what other things that you can do to help prevent falls. This information is not intended to replace advice given to you by your health care provider. Make sure you discuss any questions you have with your health care provider. Document Released: 04/23/2009 Document Revised: 12/03/2015 Document Reviewed: 08/01/2014  2017 [employer]  Health Maintenance, Female Introduction Adopting a healthy lifestyle and getting preventive care can go a long way to promote health and wellness. Talk with your health care provider about what schedule of regular examinations is right for you. This is a good chance for you to check in with your provider about disease prevention and staying healthy. In between checkups, there are plenty of things you can do on your own. Experts have done a lot of research about which lifestyle changes and preventive measures are most likely to keep you healthy. Ask your health care provider for more information. Weight and diet Eat a healthy diet  Be sure to include plenty of vegetables, fruits, low-fat dairy products, and lean protein.  Do not eat a lot of foods high in solid fats, added sugars, or salt.  Get regular exercise.  This is one of the most important things you can do for your health.  Most adults should exercise for at least 150 minutes each week. The exercise should increase your heart rate and make you sweat (moderate-intensity exercise).  Most adults should also do strengthening exercises at least twice a week. This is in addition to the moderate-intensity exercise. Maintain a healthy weight  Body mass index (BMI) is a measurement that can be used to identify possible weight problems. It estimates body fat based on height and weight. Your health care provider can help determine your  BMI and help you achieve or maintain a healthy weight.  For females 86 years of age and older:  A BMI below 18.5 is considered underweight.  A BMI of 18.5 to 24.9 is normal.  A BMI of 25 to 29.9 is considered overweight.  A BMI of 30 and above is considered obese. Watch levels of cholesterol and blood lipids  You should start having your blood tested for lipids and cholesterol at 81 years of age, then have this test every 5 years.  You may need to have your cholesterol levels checked more often if:  Your lipid or cholesterol levels are high.  You are older than 81 years of age.  You are at high risk for heart disease. Cancer screening Lung Cancer  Lung cancer screening is recommended for adults 21-23 years old who are at high risk for lung cancer because of a history of smoking.  A yearly low-dose CT scan of the lungs is recommended for people who:  Currently smoke.  Have quit within the past 15 years.  Have at least a 30-pack-year history of smoking. A pack year is smoking an average of one pack of cigarettes a day for 1 year.  Yearly screening should continue until it has been 15 years since you quit.  Yearly screening should stop if you develop a health problem that would prevent you from having lung cancer treatment. Breast Cancer  Practice breast self-awareness. This means understanding how your  breasts normally appear and feel.  It also means doing regular breast self-exams. Let your health care provider know about any changes, no matter how small.  If you are in your 20s or 30s, you should have a clinical breast exam (CBE) by a health care provider every 1-3 years as part of a regular health exam.  If you are 42 or older, have a CBE every year. Also consider having a breast X-ray (mammogram) every year.  If you have a family history of breast cancer, talk to your health care provider about genetic screening.  If you are at high risk for breast cancer, talk to your health care provider about having an MRI and a mammogram every year.  Breast cancer gene (BRCA) assessment is recommended for women who have family members with BRCA-related cancers. BRCA-related cancers include:  Breast.  Ovarian.  Tubal.  Peritoneal cancers.  Results of the assessment will determine the need for genetic counseling and BRCA1 and BRCA2 testing. Cervical Cancer  Your health care provider may recommend that you be screened regularly for cancer of the pelvic organs (ovaries, uterus, and vagina). This screening involves a pelvic examination, including checking for microscopic changes to the surface of your cervix (Pap test). You may be encouraged to have this screening done every 3 years, beginning at age 36.  For women ages 65-65, health care providers may recommend pelvic exams and Pap testing every 3 years, or they may recommend the Pap and pelvic exam, combined with testing for human papilloma virus (HPV), every 5 years. Some types of HPV increase your risk of cervical cancer. Testing for HPV may also be done on women of any age with unclear Pap test results.  Other health care providers may not recommend any screening for nonpregnant women who are considered low risk for pelvic cancer and who do not have symptoms. Ask your health care provider if a screening pelvic exam is right for you.  If you  have had past treatment for cervical cancer or a condition  that could lead to cancer, you need Pap tests and screening for cancer for at least 20 years after your treatment. If Pap tests have been discontinued, your risk factors (such as having a new sexual partner) need to be reassessed to determine if screening should resume. Some women have medical problems that increase the chance of getting cervical cancer. In these cases, your health care provider may recommend more frequent screening and Pap tests. Colorectal Cancer  This type of cancer can be detected and often prevented.  Routine colorectal cancer screening usually begins at 81 years of age and continues through 81 years of age.  Your health care provider may recommend screening at an earlier age if you have risk factors for colon cancer.  Your health care provider may also recommend using home test kits to check for hidden blood in the stool.  A small camera at the end of a tube can be used to examine your colon directly (sigmoidoscopy or colonoscopy). This is done to check for the earliest forms of colorectal cancer.  Routine screening usually begins at age 39.  Direct examination of the colon should be repeated every 5-10 years through 81 years of age. However, you may need to be screened more often if early forms of precancerous polyps or small growths are found. Skin Cancer  Check your skin from head to toe regularly.  Tell your health care provider about any new moles or changes in moles, especially if there is a change in a mole's shape or color.  Also tell your health care provider if you have a mole that is larger than the size of a pencil eraser.  Always use sunscreen. Apply sunscreen liberally and repeatedly throughout the day.  Protect yourself by wearing long sleeves, pants, a wide-brimmed hat, and sunglasses whenever you are outside. Heart disease, diabetes, and high blood pressure  High blood pressure causes heart  disease and increases the risk of stroke. High blood pressure is more likely to develop in:  People who have blood pressure in the high end of the normal range (130-139/85-89 mm Hg).  People who are overweight or obese.  People who are African American.  If you are 64-33 years of age, have your blood pressure checked every 3-5 years. If you are 42 years of age or older, have your blood pressure checked every year. You should have your blood pressure measured twice-once when you are at a hospital or clinic, and once when you are not at a hospital or clinic. Record the average of the two measurements. To check your blood pressure when you are not at a hospital or clinic, you can use:  An automated blood pressure machine at a pharmacy.  A home blood pressure monitor.  If you are between 60 years and 16 years old, ask your health care provider if you should take aspirin to prevent strokes.  Have regular diabetes screenings. This involves taking a blood sample to check your fasting blood sugar level.  If you are at a normal weight and have a low risk for diabetes, have this test once every three years after 81 years of age.  If you are overweight and have a high risk for diabetes, consider being tested at a younger age or more often. Preventing infection Hepatitis B  If you have a higher risk for hepatitis B, you should be screened for this virus. You are considered at high risk for hepatitis B if:  You were born in a country  where hepatitis B is common. Ask your health care provider which countries are considered high risk.  Your parents were born in a high-risk country, and you have not been immunized against hepatitis B (hepatitis B vaccine).  You have HIV or AIDS.  You use needles to inject street drugs.  You live with someone who has hepatitis B.  You have had sex with someone who has hepatitis B.  You get hemodialysis treatment.  You take certain medicines for conditions,  including cancer, organ transplantation, and autoimmune conditions. Hepatitis C  Blood testing is recommended for:  Everyone born from [birth year] through 1963.  Anyone with known risk factors for hepatitis C. Sexually transmitted infections (STIs)  You should be screened for sexually transmitted infections (STIs) including gonorrhea and chlamydia if:  You are sexually active and are younger than 81 years of age.  You are older than 81 years of age and your health care provider tells you that you are at risk for this type of infection.  Your sexual activity has changed since you were last screened and you are at an increased risk for chlamydia or gonorrhea. Ask your health care provider if you are at risk.  If you do not have HIV, but are at risk, it may be recommended that you take a prescription medicine daily to prevent HIV infection. This is called pre-exposure prophylaxis (PrEP). You are considered at risk if:  You are sexually active and do not regularly use condoms or know the HIV status of your partner(s).  You take drugs by injection.  You are sexually active with a partner who has HIV. Talk with your health care provider about whether you are at high risk of being infected with HIV. If you choose to begin PrEP, you should first be tested for HIV. You should then be tested every 3 months for as long as you are taking PrEP. Pregnancy  If you are premenopausal and you may become pregnant, ask your health care provider about preconception counseling.  If you may become pregnant, take 400 to 800 micrograms (mcg) of folic acid every day.  If you want to prevent pregnancy, talk to your health care provider about birth control (contraception). Osteoporosis and menopause  Osteoporosis is a disease in which the bones lose minerals and strength with aging. This can result in serious bone fractures. Your risk for osteoporosis can be identified using a bone density scan.  If you are 64  years of age or older, or if you are at risk for osteoporosis and fractures, ask your health care provider if you should be screened.  Ask your health care provider whether you should take a calcium or vitamin D supplement to lower your risk for osteoporosis.  Menopause may have certain physical symptoms and risks.  Hormone replacement therapy may reduce some of these symptoms and risks. Talk to your health care provider about whether hormone replacement therapy is right for you. Follow these instructions at home:  Schedule regular health, dental, and eye exams.  Stay current with your immunizations.  Do not use any tobacco products including cigarettes, chewing tobacco, or electronic cigarettes.  If you are pregnant, do not drink alcohol.  If you are breastfeeding, limit how much and how often you drink alcohol.  Limit alcohol intake to no more than 1 drink per day for nonpregnant women. One drink equals 12 ounces of beer, 5 ounces of wine, or 1 ounces of hard liquor.  Do not use street drugs.  Do not share needles.  Ask your health care provider for help if you need support or information about quitting drugs.  Tell your health care provider if you often feel depressed.  Tell your health care provider if you have ever been abused or do not feel safe at home. This information is not intended to replace advice given to you by your health care provider. Make sure you discuss any questions you have with your health care provider. Document Released: 01/10/2011 Document Revised: 12/03/2015 Document Reviewed: 03/31/2015  2017 [employer]

## 2016-07-21 NOTE — Progress Notes (Signed)
Subjective:   [name] [name] is a 83 y.o. female who presents for Medicare Annual (Subsequent) preventive examination.  The Patient was informed that the wellness visit is to identify future health risk and educate and initiate measures that can reduce risk for increased disease through the lifespan.   Review of Systems:  No ROS.  Medicare Wellness Visit.  Cardiac Risk Factors include: advanced age (>43men, >5 women);dyslipidemia;hypertension   Sleep patterns: Sleeps 6 hours.  Home Safety/Smoke Alarms:  Smoke detectors and security in place.  Living environment; residence and Firearm Safety: Lives alone in single story. Firearms locked away.  Seat Belt Safety/Bike Helmet: Wears seat belt.   Counseling:   Eye Exam-Last exam 12/2015, yearly by Dr. [facility]-Full dentures, gum care discussed.   Female:   Pap-N/A      Mammo-04/03/2015, negative. Scheduled for March 2018       Dexa scan- pt reports last 02/2016, osteoporosis.      CCS-colonoscopy 01/09/2008, normal.       Objective:     Vitals: BP 122/60 (BP Location: Left Arm, Patient Position: Sitting, Cuff Size: Normal)   Pulse 93   Ht 5\' 4"  (1.626 m)   Wt 133 lb 1.9 oz (60.4 kg)   SpO2 97%   BMI 22.85 kg/m   Body mass index is 22.85 kg/m.   Tobacco History  Smoking Status  . Former Smoker  . Packs/day: 0.30  . Years: 20.00  . Types: Cigarettes  . Quit date: 07/11/1973  Smokeless Tobacco  . Never Used     Counseling given: No   Past Medical History:  Diagnosis Date  . Allergic rhinitis   . Allergy   . CAD (coronary artery disease)    Mild CAD by cath 2008  . DJD (degenerative joint disease)    rheumatoid  . DVT (deep venous thrombosis) ([place])   . Dyslipidemia   . History of nuclear stress test    Myoview 5/17:  EF 74%, normal perfusion, low risk  . History of pulmonary embolism   . Hyperlipidemia   . Insomnia   . Osteoporosis   . Pulmonary fibrosis ([place])   . Rheumatoid arthritis [facility])     Past Surgical History:  Procedure Laterality Date  . ABDOMINAL HYSTERECTOMY  1974  . APPENDECTOMY  1959  . CATARACT EXTRACTION    . LEFT HEART CATHETERIZATION WITH CORONARY ANGIOGRAM N/A 06/18/2013   Procedure: LEFT HEART CATHETERIZATION WITH CORONARY ANGIOGRAM;  Surgeon: Peter M Martinique, MD;  Location: [facility] CATH LAB;  Service: Cardiovascular;  Laterality: N/A;  . TONSILLECTOMY  1941, 1951  . TOTAL KNEE ARTHROPLASTY  1997, 2007   Bilateral   Family History  Problem Relation Age of Onset  . Kidney disease Daughter 5  . Cancer Daughter   . Heart attack Father 2  . Alzheimer's disease Sister   . Heart disease Sister   . Alzheimer's disease Sister    History  Sexual Activity  . Sexual activity: No    Outpatient Encounter Prescriptions as of 07/21/2016  Medication Sig  . ALPRAZolam (XANAX) 0.25 MG tablet Take 0.25 mg by mouth at bedtime as needed for anxiety.  [name] aspirin EC 81 MG tablet Take 1 tablet (81 mg total) by mouth daily.  [name] atorvastatin (LIPITOR) 10 MG tablet Take 1 tablet (10 mg total) by mouth daily.  . cetirizine (ZYRTEC) 10 MG tablet Take 5-10 mg by mouth daily as needed for allergies.  . Cholecalciferol (VITAMIN D3) 2000 UNITS capsule Take 1  capsule (2,000 Units total) by mouth daily.  [name] enoxaparin (LOVENOX) 30 MG/0.3ML injection Inject 0.3 mLs (30 mg total) into the skin every 12 (twelve) hours. (Patient taking differently: Inject 30 mg into the skin every 12 (twelve) hours. )  . fluticasone (FLONASE) 50 MCG/ACT nasal spray Place 2 sprays into both nostrils daily as needed for allergies or rhinitis.  . furosemide (LASIX) 20 MG tablet Take one tablet by mouth daily as needed for swelling  . InFLIXimab (REMICADE IV) Inject into the vein.  [name] latanoprost (XALATAN) 0.005 % ophthalmic solution Place 1 drop into both eyes at bedtime.   . predniSONE (DELTASONE) 5 MG tablet TAKE 1 AND A HALF TABLETS BY MOUTH WITH FOOD OR MILK ONCE DAILY  . ranitidine (ZANTAC) 150 MG tablet Take  1 tablet (150 mg total) by mouth 2 (two) times daily.  [name] leflunomide (ARAVA) 10 MG tablet Take 10 mg by mouth daily.   [name] Glycol-Propyl Glycol (SYSTANE) 0.4-0.3 % SOLN Apply 1 drop to eye daily as needed (for dry eyes).  . [DISCONTINUED] trimethoprim (TRIMPEX) 100 MG tablet Take 100 mg by mouth daily.    Facility-Administered Encounter Medications as of 07/21/2016  Medication  . technetium sestamibi generic (CARDIOLITE) injection [place] of Daily Living In your present state of health, do you have any difficulty performing the following activities: 07/21/2016  Hearing? N  Vision? N  Difficulty concentrating or making decisions? N  Walking or climbing stairs? N  Dressing or bathing? N  Doing errands, shopping? N  Preparing Food and eating ? N  Using the Toilet? N  In the past six months, have you accidently leaked urine? Y  Do you have problems with loss of bowel control? N  Managing your Medications? N  Managing your Finances? N  Housekeeping or managing your Housekeeping? N  Some recent data might be hidden    Patient Care Team: Hoyt Koch, MD as PCP - General (Internal Medicine) Burnell Blanks, MD as Consulting Physician (Cardiology) Marygrace Drought, MD as Consulting Physician (Ophthalmology) Juanito Doom, MD as Consulting Physician (Pulmonary Disease) Gavin Pound, MD as Consulting Physician (Rheumatology)    Assessment:    Physical assessment deferred to PCP.  Exercise Activities and Dietary recommendations Current Exercise Habits: The patient does not participate in regular exercise at present, Exercise limited by: None identified   Diet (meal preparation, eat out, water intake, caffeinated beverages, dairy products, fruits and vegetables): Drinks water.  Breakfast:yogurt and half banana.  Lunch: sandwich, salad, soup Dinner: Asian bar at [employer], vegetables.     Discussed heart healthy diet and increasing  exercise routine.   Goals    . Exercise 3x per week (30 min per time)          Increase physical activity.       Fall Risk Fall Risk  07/21/2016 06/01/2016 03/07/2016 09/11/2014 05/08/2013  Falls in the past year? No No No Yes No  Number falls in past yr: - - - 2 or more -  Injury with Fall? - - - Yes -   Depression Screen PHQ 2/9 Scores 07/21/2016 06/01/2016 03/05/2015 09/11/2014  PHQ - 2 Score 0 0 0 0     Cognitive Function       Ad8 score reviewed for issues:  Issues making decisions:no  Less interest in hobbies / activities:no  Repeats questions, stories (family complaining):no  Trouble using ordinary gadgets (microwave, computer, phone):no  Forgets the month or  year: no  Mismanaging finances: no  Remembering appts:no  Daily problems with thinking and/or memory:no Ad8 score is=0     Immunization History  Administered Date(s) Administered  . Influenza Split 05/09/2011  . Influenza Whole 04/10/2010, 04/10/2012  . Influenza,inj,Quad PF,36+ Mos 07/25/2013, 04/29/2014, 04/16/2015  . Pneumococcal Conjugate-13 04/16/2015  . Tdap 11/12/2013  . Zoster 07/11/2013   Screening Tests Health Maintenance  Topic Date Due  . DEXA SCAN  10/03/1997  . INFLUENZA VACCINE  02/09/2016  . PNA vac Low Risk Adult (2 of 2 - PPSV23) 04/15/2016  . TETANUS/TDAP  11/13/2023  . ZOSTAVAX  Completed      Plan:     Continue to eat heart healthy diet (full of fruits, vegetables, whole grains, lean protein, water--limit salt, fat, and sugar intake) and increase physical activity as tolerated.  Continue doing brain stimulating activities (puzzles, reading, adult coloring books, staying active) to keep memory sharp.   Bring a copy of your advance directives to your next office visit.  Bring name of providers for bone scan and Rheumotologist/Pulmonologist at [facility].    During the course of the visit the patient was educated and counseled about the following appropriate screening and  preventive services:   Vaccines to include Pneumoccal, Influenza, Hepatitis B, Td, Zostavax, HCV  Cardiovascular Disease  Colorectal cancer screening  Bone density screening  Diabetes screening  Glaucoma screening  Mammography/PAP  Nutrition counseling   Patient Instructions (the written plan) was given to the patient.   Gerilyn Nestle, RN  07/21/2016

## 2016-07-22 NOTE — Progress Notes (Signed)
Medical screening examination/treatment/procedure(s) were performed by non-physician practitioner and as supervising physician I was immediately available for consultation/collaboration. I agree with above. Gunhild Bautch A Risa Auman, MD 

## 2016-08-01 DIAGNOSIS — M0579 Rheumatoid arthritis with rheumatoid factor of multiple sites without organ or systems involvement: Secondary | ICD-10-CM | POA: Diagnosis not present

## 2016-08-05 ENCOUNTER — Ambulatory Visit (INDEPENDENT_AMBULATORY_CARE_PROVIDER_SITE_OTHER): Payer: Medicare Other | Admitting: Pulmonary Disease

## 2016-08-05 ENCOUNTER — Ambulatory Visit (INDEPENDENT_AMBULATORY_CARE_PROVIDER_SITE_OTHER)
Admission: RE | Admit: 2016-08-05 | Discharge: 2016-08-05 | Disposition: A | Discharge status: Home / Self Care | Payer: Medicare Other | Source: Ambulatory Visit | Attending: Pulmonary Disease | Admitting: Pulmonary Disease

## 2016-08-05 ENCOUNTER — Encounter: Payer: Self-pay | Admitting: Pulmonary Disease

## 2016-08-05 ENCOUNTER — Other Ambulatory Visit (INDEPENDENT_AMBULATORY_CARE_PROVIDER_SITE_OTHER): Payer: Medicare Other

## 2016-08-05 VITALS — BP 118/72 | HR 87 | Ht 63.5 in | Wt 133.0 lb

## 2016-08-05 DIAGNOSIS — M069 Rheumatoid arthritis, unspecified: Secondary | ICD-10-CM | POA: Diagnosis not present

## 2016-08-05 DIAGNOSIS — J8410 Pulmonary fibrosis, unspecified: Secondary | ICD-10-CM | POA: Diagnosis not present

## 2016-08-05 DIAGNOSIS — M546 Pain in thoracic spine: Secondary | ICD-10-CM | POA: Diagnosis not present

## 2016-08-05 DIAGNOSIS — Z86718 Personal history of other venous thrombosis and embolism: Secondary | ICD-10-CM | POA: Diagnosis not present

## 2016-08-05 DIAGNOSIS — R079 Chest pain, unspecified: Secondary | ICD-10-CM | POA: Diagnosis not present

## 2016-08-05 DIAGNOSIS — M549 Dorsalgia, unspecified: Secondary | ICD-10-CM | POA: Insufficient documentation

## 2016-08-05 LAB — BASIC METABOLIC PANEL
BUN: 14 mg/dL (ref 6–23)
CHLORIDE: 105 meq/L (ref 96–112)
CO2: 30 mEq/L (ref 19–32)
CREATININE: 0.78 mg/dL (ref 0.40–1.20)
Calcium: 9.4 mg/dL (ref 8.4–10.5)
GFR: 90.52 mL/min (ref >60.00)
Glucose, Bld: 106 mg/dL — ABNORMAL HIGH (ref 70–99)
Potassium: 4.3 mEq/L (ref 3.5–5.1)
Sodium: 140 mEq/L (ref 135–145)

## 2016-08-05 NOTE — Assessment & Plan Note (Signed)
She describes midthoracic back pain which is been going on for the last 2 weeks. She has mild tenderness to palpation on exam. She describes the pain as mid scapular. She has some tenderness to palpation on exam. While this may just be a musculoskeletal issue related to her chronic cough, the differential diagnosis includes vertebral body compression versus much less likely aortic dissection. She does have a history of aortic calcification. On physical exam today she's got equal pulses bilaterally.  Plan: Basic metabolic panel Chest x-ray CT angiogram of the chest Go to the ER the symptoms worsen Use warm compresses over the weekend.

## 2016-08-05 NOTE — Assessment & Plan Note (Signed)
We've had no evidence of progression and clinically today as she has no worsening shortness of breath I have no reason to suspect that this is worsened.  Plan: Repeat lung function testing in July 2018

## 2016-08-05 NOTE — Patient Instructions (Signed)
We will call you with the results of today's chest x-ray We will arrange for lung function testing in July We will arrange for a CT angiogram of your chest after blood work today to evaluate the chest pain I recommend that you use warm compresses over the weekend to treat the back pain, if this problem accelerates the need to go the emergency room. I will see you back in 01/2017

## 2016-08-05 NOTE — Assessment & Plan Note (Signed)
Stable with treatment with infusion therapy of Remicade as well as Leflunomide.

## 2016-08-05 NOTE — Progress Notes (Signed)
Subjective:    Patient ID: [name] [name], female    DOB: 1930-12-07, 83 y.o.   MRN: [id number]  Synopsis: This is a very pleasant female who is diagnosed with interstitial lung disease in her mid 46s after evaluation at [facility] and [facility]. She was found to have a UIP pattern on CT scan and serology testing revealed a very high rheumatoid factor as well as anti-CCP antibody. She was followed by Dr. Elgie Collard at [facility] who felt like she had rheumatoid arthritis associated interstitial lung disease. He never treated her with immunosuppressive therapy. From 2000 07/09/2013 she's continued to follow in both [place] and due to her lung function has remained stable during that time. She has not developed new shortness of breath. She has had repeated episodes of pulmonary emboli. The most recent was in the summer of 2014 when she developed her fourth pulmonary embolism after returning from a trip to [place].   HPI Chief Complaint  Patient presents with  . Follow-up    pt c/o worsening hoarseness, increased mucus production.  pt also c/o cramps in ribs that makes her SOB.     Deloma has been seen by the Rheumatologist here at [facility] rheumatology and she has been using remicade IV and her pain has improved significantly.  She still has some thumb pain from time to time, but her pain has imrpoved significantly.  She has not had any significant breathing abnormalities since the last visit.  However she notes some persistent cough with chest congestion which has been persistent since a cold a few months ago.  She feels a cramping pain in her back between her shoulder blades.  She has noticed the pain for two weeks. She feels that it is like a stabbing pain.  The pain will get better with sitting up.  She can't think of anything that caused it.    Past Medical History:  Diagnosis Date  . Allergic rhinitis   . Allergy   . CAD (coronary artery  disease)    Mild CAD by cath 2008  . DJD (degenerative joint disease)    rheumatoid  . DVT (deep venous thrombosis) ([place])   . Dyslipidemia   . History of nuclear stress test    Myoview 5/17:  EF 74%, normal perfusion, low risk  . History of pulmonary embolism   . Hyperlipidemia   . Insomnia   . Osteoporosis   . Pulmonary fibrosis ([place])   . Rheumatoid arthritis ([place])        Review of Systems  Constitutional: Negative for chills, fatigue and fever.  HENT: Negative for congestion, postnasal drip and rhinorrhea.   Respiratory: Positive for cough and chest tightness. Negative for shortness of breath and wheezing.   Cardiovascular: Negative for chest pain, palpitations and leg swelling.       Objective:   Physical Exam  Vitals:   08/05/16 1620  BP: 118/72  BP Location: Left Arm  Cuff Size: Normal  Pulse: 87  SpO2: 95%  Weight: 133 lb (60.3 kg)  Height: 5' 3.5" (1.613 m)  RA  Gen: well appearing HENT: OP clear, TM's clear, neck supple PULM: Crackles bases B, normal percussion CV: RRR, no mgr, trace edema GI: BS+, soft, nontender Derm: no cyanosis or rash Psyche: normal mood and affect MSK: some pain to palpation intercostal muscles near spine bilaterally thoracic level, no warmth or tenderness there  Gen: well appearing, no acute distress HEENT: NCAT, PERRL, EOMi,  PULM: Crackles in bases CV: RRR, no mgr, no JVD AB: BS+, soft, nontender, no hsm Ext: warm, trace bilateral edema, no clubbing, no cyanosis  July 2017 pulmonary function testing FVC 1.81 L 90% predicted, total lung capacity 3.79 L 72% predicted, DLCO 8.29 32% predicted. July 2017 6 minute walk distance 324 m, O2 saturation nadir 93% on room air  Records from her visit with her primary care physician reviewed.  BMET    Component Value Date/Time   NA 139 11/30/2015 1327   K 4.5 11/30/2015 1327   CL 103 11/30/2015 1327   CO2 26 11/30/2015 1327   GLUCOSE 93 11/30/2015 1327   BUN 6 (L) 11/30/2015  1327   CREATININE 0.74 11/30/2015 1327   CALCIUM 9.6 11/30/2015 1327   GFRNONAA 78 (L) 06/18/2013 0613   GFRAA >90 06/18/2013 [id number]        Assessment & Plan:   Personal history of DVT (deep vein thrombosis) Because of multiple falls she has experienced in the last 2 years we elected to stop anticoagulation therapy. She has a prescription for Lovenox to use when she goes on international travel.  Postinflammatory pulmonary fibrosis (HCC) We've had no evidence of progression and clinically today as she has no worsening shortness of breath I have no reason to suspect that this is worsened.  Plan: Repeat lung function testing in July 2018  RA (rheumatoid arthritis) Stable with treatment with infusion therapy of Remicade as well as Leflunomide.  Back pain She describes midthoracic back pain which is been going on for the last 2 weeks. She has mild tenderness to palpation on exam. She describes the pain as mid scapular. She has some tenderness to palpation on exam. While this may just be a musculoskeletal issue related to her chronic cough, the differential diagnosis includes vertebral body compression versus much less likely aortic dissection. She does have a history of aortic calcification. On physical exam today she's got equal pulses bilaterally.  Plan: Basic metabolic panel Chest x-ray CT angiogram of the chest Go to the ER the symptoms worsen Use warm compresses over the weekend.    Updated Medication List Outpatient Encounter Prescriptions as of 08/05/2016  Medication Sig Dispense Refill  . ALPRAZolam (XANAX) 0.25 MG tablet Take 0.25 mg by mouth at bedtime as needed for anxiety.    [name] aspirin EC 81 MG tablet Take 1 tablet (81 mg total) by mouth daily.    [name] atorvastatin (LIPITOR) 10 MG tablet Take 1 tablet (10 mg total) by mouth daily. 30 tablet 11  . cetirizine (ZYRTEC) 10 MG tablet Take 5-10 mg by mouth daily as needed for allergies.    . Cholecalciferol (VITAMIN D3) 2000  UNITS capsule Take 1 capsule (2,000 Units total) by mouth daily. 100 capsule 3  . enoxaparin (LOVENOX) 30 MG/0.3ML injection Inject 0.3 mLs (30 mg total) into the skin every 12 (twelve) hours. (Patient taking differently: Inject 30 mg into the skin every 12 (twelve) hours. ) 0.6 mL 2  . fluticasone (FLONASE) 50 MCG/ACT nasal spray Place 2 sprays into both nostrils daily as needed for allergies or rhinitis. 16 g 3  . furosemide (LASIX) 20 MG tablet Take one tablet by mouth daily as needed for swelling 15 tablet 6  . InFLIXimab (REMICADE IV) Inject into the vein.    [name] latanoprost (XALATAN) 0.005 % ophthalmic solution Place 1 drop into both eyes at bedtime.   0  . leflunomide (ARAVA) 10 MG tablet Take 10 mg by mouth daily.  0  . Polyethyl Glycol-Propyl Glycol (SYSTANE) 0.4-0.3 % SOLN Apply 1 drop to eye daily as needed (for dry eyes).    . predniSONE (DELTASONE) 5 MG tablet TAKE 1 AND A HALF TABLETS BY MOUTH WITH FOOD OR MILK ONCE DAILY  0  . ranitidine (ZANTAC) 150 MG tablet Take 1 tablet (150 mg total) by mouth 2 (two) times daily. 60 tablet 0   Facility-Administered Encounter Medications as of 08/05/2016  Medication Dose Route Frequency Provider Last Rate Last Dose  . technetium sestamibi generic (CARDIOLITE) injection 33 milli [place]ie  33 millicurie Intravenous Once PRN Josue Hector, MD

## 2016-08-05 NOTE — Assessment & Plan Note (Signed)
Because of multiple falls she has experienced in the last 2 years we elected to stop anticoagulation therapy. She has a prescription for Lovenox to use when she goes on international travel.

## 2016-08-05 NOTE — Addendum Note (Signed)
Addended by: Len Blalock on: 08/05/2016 05:05 PM   Modules accepted: Orders

## 2016-08-08 ENCOUNTER — Ambulatory Visit (INDEPENDENT_AMBULATORY_CARE_PROVIDER_SITE_OTHER)
Admission: RE | Admit: 2016-08-08 | Discharge: 2016-08-08 | Disposition: A | Discharge status: Home / Self Care | Payer: Medicare Other | Source: Ambulatory Visit | Attending: Pulmonary Disease | Admitting: Pulmonary Disease

## 2016-08-08 DIAGNOSIS — Z79899 Other long term (current) drug therapy: Secondary | ICD-10-CM | POA: Diagnosis not present

## 2016-08-08 DIAGNOSIS — M0579 Rheumatoid arthritis with rheumatoid factor of multiple sites without organ or systems involvement: Secondary | ICD-10-CM | POA: Diagnosis not present

## 2016-08-08 DIAGNOSIS — T1511XA Foreign body in conjunctival sac, right eye, initial encounter: Secondary | ICD-10-CM | POA: Diagnosis not present

## 2016-08-08 DIAGNOSIS — H01002 Unspecified blepharitis right lower eyelid: Secondary | ICD-10-CM | POA: Diagnosis not present

## 2016-08-08 DIAGNOSIS — H01001 Unspecified blepharitis right upper eyelid: Secondary | ICD-10-CM | POA: Diagnosis not present

## 2016-08-08 DIAGNOSIS — M549 Dorsalgia, unspecified: Secondary | ICD-10-CM | POA: Diagnosis not present

## 2016-08-08 DIAGNOSIS — M546 Pain in thoracic spine: Secondary | ICD-10-CM | POA: Diagnosis not present

## 2016-08-08 DIAGNOSIS — Z6822 Body mass index (BMI) 22.0-22.9, adult: Secondary | ICD-10-CM | POA: Diagnosis not present

## 2016-08-08 DIAGNOSIS — J849 Interstitial pulmonary disease, unspecified: Secondary | ICD-10-CM | POA: Diagnosis not present

## 2016-08-08 DIAGNOSIS — M2550 Pain in unspecified joint: Secondary | ICD-10-CM | POA: Diagnosis not present

## 2016-08-08 DIAGNOSIS — H04121 Dry eye syndrome of right lacrimal gland: Secondary | ICD-10-CM | POA: Diagnosis not present

## 2016-08-08 MED ORDER — IOPAMIDOL (ISOVUE-370) INJECTION 76%
100.0000 mL | Freq: Once | INTRAVENOUS | Status: AC | PRN
  Administered 2016-08-08: 100 mL via INTRAVENOUS

## 2016-09-09 ENCOUNTER — Ambulatory Visit (INDEPENDENT_AMBULATORY_CARE_PROVIDER_SITE_OTHER): Payer: Medicare Other | Admitting: Cardiovascular Disease

## 2016-09-09 ENCOUNTER — Encounter: Payer: Self-pay | Admitting: Cardiovascular Disease

## 2016-09-09 VITALS — BP 98/60 | HR 79 | Ht 64.5 in | Wt 135.0 lb

## 2016-09-09 DIAGNOSIS — E7800 Pure hypercholesterolemia, unspecified: Secondary | ICD-10-CM

## 2016-09-09 DIAGNOSIS — I2510 Atherosclerotic heart disease of native coronary artery without angina pectoris: Secondary | ICD-10-CM

## 2016-09-09 NOTE — Patient Instructions (Signed)

## 2016-09-09 NOTE — Progress Notes (Signed)
Chief Complaint  Patient presents with  . Follow-up    6 months     History of Present Illness: 83 yo WF with history of CAD, DVT/PE, HLD, pulmonary fibrosis who is here today for cardiac follow up. I saw her 02/27/12 as a new patient for evaluation of chest pains and pain between her shoulder blades. Stress test was normal in August 2013. Known to have mild CAD by cath 2008. Admitted December 2014 with chest pain. She underwent cardiac catheterization on December 9,2014 and found to have 30% mid LAD, 30% mid Circumflex, 20% mid RCA. She has not tolerated statins in the past. Echo 03/06/12 with normal LV size and function with no significant valvular abnormalities. Episode of chest pain after a car trip May 2017. Nuclear stress test May 2017 with no ischemia. Chest CTA without acute PE.   She is here today for f/u. She denies any chest pain. She does have chronic pain between her shoulder blades. She does endorse leg swelling to that comes and goes. This is chronic. She rarely takes lasix.     Primary Care Physician: Hoyt Koch, MD   Past Medical History:  Diagnosis Date  . Allergic rhinitis   . Allergy   . CAD (coronary artery disease)    Mild CAD by cath 2008  . DJD (degenerative joint disease)    rheumatoid  . DVT (deep venous thrombosis) ([place])   . Dyslipidemia   . History of nuclear stress test    Myoview 5/17:  EF 74%, normal perfusion, low risk  . History of pulmonary embolism   . Hyperlipidemia   . Insomnia   . Osteoporosis   . Pulmonary fibrosis ([place])   . Rheumatoid arthritis [facility])     Past Surgical History:  Procedure Laterality Date  . ABDOMINAL HYSTERECTOMY  1974  . APPENDECTOMY  1959  . CATARACT EXTRACTION    . LEFT HEART CATHETERIZATION WITH CORONARY ANGIOGRAM N/A 06/18/2013   Procedure: LEFT HEART CATHETERIZATION WITH CORONARY ANGIOGRAM;  Surgeon: Peter M Martinique, MD;  Location: [facility] CATH LAB;  Service: Cardiovascular;  Laterality: N/A;  . TONSILLECTOMY   1941, 1951  . TOTAL KNEE ARTHROPLASTY  1997, 2007   Bilateral    Current Outpatient Prescriptions  Medication Sig Dispense Refill  . ALPRAZolam (XANAX) 0.25 MG tablet Take 0.25 mg by mouth at bedtime as needed for anxiety.    [name] aspirin EC 81 MG tablet Take 1 tablet (81 mg total) by mouth daily.    [name] atorvastatin (LIPITOR) 10 MG tablet Take 1 tablet (10 mg total) by mouth daily. 30 tablet 11  . cetirizine (ZYRTEC) 10 MG tablet Take 5-10 mg by mouth daily as needed for allergies.    . Cholecalciferol (VITAMIN D3) 2000 UNITS capsule Take 1 capsule (2,000 Units total) by mouth daily. 100 capsule 3  . enoxaparin (LOVENOX) 30 MG/0.3ML injection Inject 30 mg into the skin every 12 (twelve) hours as needed (travel only).    . fluticasone (FLONASE) 50 MCG/ACT nasal spray Place 2 sprays into both nostrils daily as needed for allergies or rhinitis. 16 g 3  . furosemide (LASIX) 20 MG tablet Take one tablet by mouth daily as needed for swelling 15 tablet 6  . InFLIXimab (REMICADE IV) Inject into the vein.    [name] latanoprost (XALATAN) 0.005 % ophthalmic solution Place 1 drop into both eyes at bedtime.   0  . Polyethyl Glycol-Propyl Glycol (SYSTANE) 0.4-0.3 % SOLN Apply 1 drop to eye daily as  needed (for dry eyes).    . predniSONE (DELTASONE) 5 MG tablet Take 5 mg by mouth daily.     No current facility-administered medications for this visit.    Facility-Administered Medications Ordered in Other Visits  Medication Dose Route Frequency Provider Last Rate Last Dose  . technetium sestamibi generic (CARDIOLITE) injection 33 milli Curie  33 millicurie Intravenous Once PRN Josue Hector, MD        Allergies  Allergen Reactions  . Crestor [Rosuvastatin] Other (See Comments)    Muscle pain  . Lactose Intolerance (Gi) Other (See Comments)    Stomach upset  . Penicillins Hives  . Niacin Hives    Social History   Social History  . Marital status: Widowed    Spouse name: N/A  . Number of children: 2    . Years of education: N/A   Occupational History  . government Designer, television/film set     retired   Social History Main Topics  . Smoking status: Former Smoker    Packs/day: 0.30    Years: 20.00    Types: Cigarettes    Quit date: 07/11/1973  . Smokeless tobacco: Never Used  . Alcohol use No  . Drug use: No  . Sexual activity: No   Other Topics Concern  . Not on file   Social History Narrative   Diet:   Do you drink/eat things with caffeine? Yes   Marital status:        Widowed                      What year were you married?1954   Do you live in a house, apartment, assisted living, condo, trailer, etc)? House   Is it one or more stories? 1 1/4   How many persons live in your home?1   Do you have any pets in your home? No   Current or past profession:  Publishing rights manager   Do you exercise?        Yes                                            Type & how often:  Walk                                    Do you have a living will? Yes   Do you have a DNR Form? Yes   Do you have a POA/HPOA forms?  Yes       Family History  Problem Relation Age of Onset  . Kidney disease Daughter 5  . Cancer Daughter   . Heart attack Father 75  . Alzheimer's disease Sister   . Heart disease Sister   . Alzheimer's disease Sister     Review of Systems:  As stated in the HPI and otherwise negative.   BP 98/60 (BP Location: Left Arm)   Pulse 79   Ht 5' 4.5" (1.638 m)   Wt 135 lb (61.2 kg)   BMI 22.81 kg/m   Physical Examination: General: Well developed, well nourished, NAD  HEENT: OP clear, mucus membranes moist  SKIN: warm, dry. No rashes. Neuro: No focal deficits  Musculoskeletal: Muscle strength 5/5 all ext  Psychiatric: Mood and affect normal  Neck: No JVD, no carotid bruits,  no thyromegaly, no lymphadenopathy.  Lungs:Clear bilaterally, no wheezes, rhonci, crackles Cardiovascular: Regular rate and rhythm. No murmurs, gallops or rubs. Abdomen:Soft. Bowel sounds  present. Non-tender.  Extremities: Trace bilateral lower extremity edema. Pulses are 2 + in the bilateral DP/PT.  Echo 03/06/12:  Left ventricle: The cavity size was normal. Wall thickness was normal. Systolic function was normal. The estimated ejection fraction was in the range of 55% to 60%. Wall motion was normal; there were no regional wall motion abnormalities. Doppler parameters are consistent with abnormal left ventricular relaxation (grade 1 diastolic dysfunction). - Aortic valve: There was no stenosis. - Mitral valve: No significant regurgitation. - Right ventricle: The cavity size was normal. Systolic function was mildly reduced. - Pulmonary arteries: PA peak pressure: 8mm Hg (S). - Inferior vena cava: The vessel was normal in size; the respirophasic diameter changes were in the normal range (= 50%); findings are consistent with normal central venous pressure. Impressions:  - Normal LV size and systolic function, EF 0000000. Normal RV size with mildly decreased systolic function. Doppler interrogation of TR jet does not suggest pulmonary hypertension. No significant valvular abnormalities.  Cardiac cath 06/18/13: Left mainstem: Normal  Left anterior descending (LAD): Mild calcification. 30% disease in the mid vessel at the take off of the first diagonal.  Left circumflex (LCx): 30% disease in the mid vessel.  Right coronary artery (RCA): Mild calcification. Mild disease in the proximal and mid vessel to 20%.  Left ventriculography: Left ventricular systolic function is normal, LVEF is estimated at 55-65%, there is no significant mitral regurgitation   EKG:  EKG is not ordered today. The ekg ordered today demonstrates   Recent Labs: 10/13/2015: ALT 15 08/05/2016: BUN 14; Creatinine, Ser 0.78; Potassium 4.3; Sodium 140   Lipid Panel    Component Value Date/Time   CHOL 204 (H) 10/13/2015 0945   TRIG 129 10/13/2015 0945   HDL 65 10/13/2015 0945   CHOLHDL 3.1  10/13/2015 0945   VLDL 26 10/13/2015 0945   LDLCALC 113 10/13/2015 0945     Wt Readings from Last 3 Encounters:  09/09/16 135 lb (61.2 kg)  08/05/16 133 lb (60.3 kg)  07/21/16 133 lb 1.9 oz (60.4 kg)     Other studies Reviewed: Additional studies/ records that were reviewed today include: . Review of the above records demonstrates:    Assessment and Plan:   1. CAD without angina: No chest pains suggestive of ischemia. She is known to have mild CAD by cath December 2014. Stress myoview May 2017 with no ischemia. Will continue statin, ASA. She does not wish to start a beta blocker.   2. Hyperlipidemia: Continue statin. LDL not at goal of 70. Repeat lipids now. May need to increase statin.   3. Bilateral Lower Ext edema: Likely dependent edema. Will use Lasix 20 mg prn.   Current medicines are reviewed at length with the patient today.  The patient does not have concerns regarding medicines.  The following changes have been made:  no change  Labs/ tests ordered today include:   No orders of the defined types were placed in this encounter.    Disposition:   FU with me  in 12  months   Signed, Lauree Chandler, MD 09/09/2016 2:30 PM    [address], [place], [place]  [address] Phone: [phone]; Fax: [phone]

## 2016-09-26 DIAGNOSIS — Z79899 Other long term (current) drug therapy: Secondary | ICD-10-CM | POA: Diagnosis not present

## 2016-09-26 DIAGNOSIS — M0579 Rheumatoid arthritis with rheumatoid factor of multiple sites without organ or systems involvement: Secondary | ICD-10-CM | POA: Diagnosis not present

## 2016-10-10 ENCOUNTER — Other Ambulatory Visit: Payer: Self-pay | Admitting: Cardiovascular Disease

## 2016-10-10 DIAGNOSIS — E785 Hyperlipidemia, unspecified: Secondary | ICD-10-CM

## 2016-10-24 ENCOUNTER — Other Ambulatory Visit (INDEPENDENT_AMBULATORY_CARE_PROVIDER_SITE_OTHER): Payer: Medicare Other

## 2016-10-24 ENCOUNTER — Encounter: Payer: Self-pay | Admitting: Internal Medicine

## 2016-10-24 ENCOUNTER — Ambulatory Visit (INDEPENDENT_AMBULATORY_CARE_PROVIDER_SITE_OTHER): Payer: Medicare Other | Admitting: Internal Medicine

## 2016-10-24 VITALS — BP 110/60 | HR 86 | Temp 97.7°F | Resp 12 | Ht 64.5 in | Wt 135.0 lb

## 2016-10-24 DIAGNOSIS — M79604 Pain in right leg: Secondary | ICD-10-CM | POA: Diagnosis not present

## 2016-10-24 DIAGNOSIS — Z86718 Personal history of other venous thrombosis and embolism: Secondary | ICD-10-CM | POA: Diagnosis not present

## 2016-10-24 DIAGNOSIS — R252 Cramp and spasm: Secondary | ICD-10-CM

## 2016-10-24 DIAGNOSIS — M79605 Pain in left leg: Secondary | ICD-10-CM

## 2016-10-24 DIAGNOSIS — I2510 Atherosclerotic heart disease of native coronary artery without angina pectoris: Secondary | ICD-10-CM

## 2016-10-24 LAB — COMPREHENSIVE METABOLIC PANEL
ALT: 18 U/L (ref 0–35)
AST: 21 U/L (ref 0–37)
Albumin: 3.6 g/dL (ref 3.5–5.2)
Alkaline Phosphatase: 68 U/L (ref 39–117)
BUN: 13 mg/dL (ref 6–23)
CALCIUM: 9.4 mg/dL (ref 8.4–10.5)
CHLORIDE: 106 meq/L (ref 96–112)
CO2: 28 mEq/L (ref 19–32)
CREATININE: 0.75 mg/dL (ref 0.40–1.20)
GFR: 94.67 mL/min (ref >60.00)
Glucose, Bld: 105 mg/dL — ABNORMAL HIGH (ref 70–99)
Potassium: 4 mEq/L (ref 3.5–5.1)
SODIUM: 140 meq/L (ref 135–145)
Total Bilirubin: 0.5 mg/dL (ref 0.2–1.2)
Total Protein: 7.3 g/dL (ref 6.0–8.3)

## 2016-10-24 LAB — MAGNESIUM: Magnesium: 1.7 mg/dL (ref 1.5–2.5)

## 2016-10-24 LAB — CBC
HEMATOCRIT: 40.2 % (ref 36.0–46.0)
Hemoglobin: 13.6 g/dL (ref 12.0–15.0)
MCHC: 33.8 g/dL (ref 30.0–36.0)
MCV: 91.4 fl (ref 78.0–100.0)
Platelets: 203 10*3/uL (ref 150.0–400.0)
RBC: 4.4 Mil/uL (ref 3.87–5.11)
RDW: 13.3 % (ref 11.5–15.5)
WBC: 9.4 10*3/uL (ref 4.0–10.5)

## 2016-10-24 LAB — TSH: TSH: 1.83 u[IU]/mL (ref 0.35–4.50)

## 2016-10-24 MED ORDER — ALPRAZOLAM 0.25 MG PO TABS: 0.2500 mg | ORAL_TABLET | Freq: Every evening | ORAL | 0 refills | Status: DC | PRN

## 2016-10-24 MED ORDER — ENOXAPARIN SODIUM 30 MG/0.3ML ~~LOC~~ SOLN: 30.0000 mg | Freq: Two times a day (BID) | SUBCUTANEOUS | 0 refills | Status: DC | PRN

## 2016-10-24 NOTE — Progress Notes (Signed)
Pre visit review using our clinic review tool, if applicable. No additional management support is needed unless otherwise documented below in the visit note. 

## 2016-10-24 NOTE — Progress Notes (Signed)
   Subjective:    Patient ID: [name] [name], female    DOB: 12-Sep-1930, 83 y.o.   MRN: [id number]  HPI The patient is an 83 YO female coming in for pain in her legs. She noticed it 2 days ago and it felt like cramping initially. She then was able to massage and walk the pain out. She denies swelling in the legs or rash. No injury or overuse in the area. She is concerned as she is taking a trip to [place] soon by herself and she has history of DVT/PE (she cannot remember the year but was after a surgery). Denies breathing problems, chest pains, SOB. She did not take anything for it. Used a rub on it which helped some. Does have RA but admits that her symptoms have been well controlled for some time without changes in meds.   Review of Systems  Constitutional: Negative for activity change, appetite change, fatigue, fever and unexpected weight change.  Respiratory: Positive for cough. Negative for chest tightness, shortness of breath and wheezing.        Chronic, unchanged  Cardiovascular: Negative.   Gastrointestinal: Negative.   Musculoskeletal: Positive for arthralgias and myalgias. Negative for gait problem, joint swelling, neck pain and neck stiffness.  Skin: Negative.   Neurological: Negative.       Objective:   Physical Exam  Constitutional: She is oriented to person, place, and time. She appears well-developed and well-nourished.  HENT:  Head: Normocephalic and atraumatic.  Eyes: EOM are normal.  Cardiovascular: Normal rate and regular rhythm.   Pulmonary/Chest: Effort normal. No respiratory distress. She has no wheezes. She has no rales.  Abdominal: Soft. She exhibits no distension. There is no tenderness. There is no rebound.  Musculoskeletal: She exhibits tenderness. She exhibits no edema.  Mild tenderness around the left ankle, no swelling or skin color change, some small varicose veins bilaterally  Neurological: She is alert and oriented to person, place, and time. Coordination  normal.  Skin: Skin is warm and dry.   Vitals:   10/24/16 1611  BP: 110/60  Pulse: 86  Resp: 12  Temp: 97.7 F (36.5 C)  TempSrc: Oral  SpO2: 94%  Weight: 135 lb (61.2 kg)  Height: 5' 4.5" (1.638 m)      Assessment & Plan:

## 2016-10-24 NOTE — Patient Instructions (Signed)
We will check the labs today and call you back about the results.   You can try taking some calcium or magnesium daily to see if this helps with the leg pain.

## 2016-10-25 DIAGNOSIS — M79606 Pain in leg, unspecified: Secondary | ICD-10-CM | POA: Insufficient documentation

## 2016-10-25 LAB — D-DIMER, QUANTITATIVE (NOT AT ARMC): D DIMER QUANT: 0.47 ug{FEU}/mL (ref <0.50)

## 2016-10-25 NOTE — Assessment & Plan Note (Signed)
Checking CMP, CBC, magnesium, TSH, d-dimer to assess for metabolic changes or problems. She does have lasix which she uses sometimes which could cause low potassium or magnesium which can induce leg cramps. Also her chronic steroid usage can cause some muscle wasting.

## 2016-10-25 NOTE — Assessment & Plan Note (Signed)
Checking D-dimer to see if elevated, if so needs US venous before leaving on trip a this would be extended flight to [place].

## 2016-10-31 DIAGNOSIS — H26491 Other secondary cataract, right eye: Secondary | ICD-10-CM | POA: Diagnosis not present

## 2016-10-31 DIAGNOSIS — H40011 Open angle with borderline findings, low risk, right eye: Secondary | ICD-10-CM | POA: Diagnosis not present

## 2016-10-31 DIAGNOSIS — H43813 Vitreous degeneration, bilateral: Secondary | ICD-10-CM | POA: Diagnosis not present

## 2016-10-31 DIAGNOSIS — H52203 Unspecified astigmatism, bilateral: Secondary | ICD-10-CM | POA: Diagnosis not present

## 2016-11-15 ENCOUNTER — Encounter: Payer: Self-pay | Admitting: Internal Medicine

## 2016-11-15 ENCOUNTER — Ambulatory Visit (INDEPENDENT_AMBULATORY_CARE_PROVIDER_SITE_OTHER): Payer: Medicare Other | Admitting: Internal Medicine

## 2016-11-15 DIAGNOSIS — J301 Allergic rhinitis due to pollen: Secondary | ICD-10-CM | POA: Diagnosis not present

## 2016-11-15 DIAGNOSIS — I2510 Atherosclerotic heart disease of native coronary artery without angina pectoris: Secondary | ICD-10-CM

## 2016-11-15 NOTE — Progress Notes (Signed)
Pre visit review using our clinic review tool, if applicable. No additional management support is needed unless otherwise documented below in the visit note. 

## 2016-11-15 NOTE — Patient Instructions (Signed)
Start taking the zyrtec (cetirizine) for the allergies. If it doesn't get better call us back.

## 2016-11-16 NOTE — Assessment & Plan Note (Signed)
Advised she can safely start taking zyrtec with her other medications. No indication for prednisone or antibiotics at this time. No signs of sinusitis.

## 2016-11-16 NOTE — Progress Notes (Signed)
   Subjective:    Patient ID: [name] [name], female    DOB: 04-25-1931, 83 y.o.   MRN: [id number]  HPI The patient is an 83 YO female coming in for sore throat and allergy symptoms. She denies fevers or chills. She denies cough or SOB. She denies headaches or sinus pressure. Going on for about 1-2 weeks and overall describes the symptoms as moderate. Overall improving in the last day or so. Less sore throat. She is not taking any allergy medication right now as she was unsure it would mix with her medication.   Review of Systems  Constitutional: Positive for activity change and fatigue. Negative for appetite change, chills, fever and unexpected weight change.  HENT: Positive for congestion, postnasal drip, rhinorrhea and sore throat. Negative for ear discharge, ear pain, nosebleeds, sinus pain, sinus pressure, trouble swallowing and voice change.   Eyes: Negative.   Respiratory: Negative for cough, chest tightness, shortness of breath and wheezing.   Cardiovascular: Negative.   Gastrointestinal: Negative.   Musculoskeletal: Negative.   Neurological: Negative.       Objective:   Physical Exam  Constitutional: She appears well-developed and well-nourished.  HENT:  Head: Normocephalic and atraumatic.  Oropharynx with redness, clear drainage, no nose crusting, no sinus pressure.   Eyes: EOM are normal.  Neck: Normal range of motion.  Cardiovascular: Normal rate and regular rhythm.   Pulmonary/Chest: Effort normal and breath sounds normal. No respiratory distress. She has no wheezes. She has no rales.  Abdominal: Soft. She exhibits no distension. There is no tenderness. There is no rebound.  Skin: Skin is warm and dry.   Vitals:   11/15/16 1443  BP: (!) 100/54  Pulse: 88  Resp: 14  Temp: 98.5 F (36.9 C)  TempSrc: Oral  SpO2: 96%  Weight: 134 lb (60.8 kg)  Height: 5' 4.5" (1.638 m)      Assessment & Plan:

## 2016-11-23 DIAGNOSIS — M0579 Rheumatoid arthritis with rheumatoid factor of multiple sites without organ or systems involvement: Secondary | ICD-10-CM | POA: Diagnosis not present

## 2016-11-24 DIAGNOSIS — H26491 Other secondary cataract, right eye: Secondary | ICD-10-CM | POA: Diagnosis not present

## 2016-12-08 ENCOUNTER — Other Ambulatory Visit: Payer: Self-pay | Admitting: *Deleted

## 2016-12-08 DIAGNOSIS — E785 Hyperlipidemia, unspecified: Secondary | ICD-10-CM

## 2016-12-08 DIAGNOSIS — E7849 Other hyperlipidemia: Secondary | ICD-10-CM

## 2016-12-08 NOTE — Telephone Encounter (Signed)
I placed call to pt to see if she has had lipid profile checked elsewhere.  Left message to call back

## 2016-12-08 NOTE — Telephone Encounter (Signed)
Okay to refill? Patient has not had a lipid panel since 10/13/15 and per last office visit,  2. Hyperlipidemia: Continue statin. LDL not at goal of 70. Repeat lipids now. May need to increase statin.  I do not see where she had this drawn. Please advise. Thanks, MI

## 2016-12-13 NOTE — Telephone Encounter (Signed)
I spoke with pt and she has not had lipid profile checked anywhere else.  She will come in for fasting lipid profile on 12/23/16.  Had CMET in 4/18.  Will need [facility] results known.

## 2016-12-23 ENCOUNTER — Other Ambulatory Visit: Payer: Medicare Other

## 2016-12-23 DIAGNOSIS — E785 Hyperlipidemia, unspecified: Secondary | ICD-10-CM | POA: Diagnosis not present

## 2016-12-23 LAB — LIPID PANEL
CHOL/HDL RATIO: 2.5 ratio (ref 0.0–4.4)
Cholesterol, Total: 177 mg/dL (ref 100–199)
HDL: 70 mg/dL (ref >39)
LDL CALC: 88 mg/dL (ref 0–99)
TRIGLYCERIDES: 96 mg/dL (ref 0–149)
VLDL Cholesterol Cal: 19 mg/dL (ref 5–40)

## 2016-12-27 MED ORDER — ATORVASTATIN CALCIUM 10 MG PO TABS: 10.0000 mg | ORAL_TABLET | Freq: Every day | ORAL | 3 refills | Status: DC

## 2016-12-27 NOTE — Telephone Encounter (Signed)
I spoke with pt and reviewed lab results from 12/23/16 with her.  [facility]

## 2016-12-28 ENCOUNTER — Ambulatory Visit (INDEPENDENT_AMBULATORY_CARE_PROVIDER_SITE_OTHER): Payer: Medicare Other | Admitting: Family Medicine

## 2016-12-28 ENCOUNTER — Encounter: Payer: Self-pay | Admitting: Family Medicine

## 2016-12-28 VITALS — BP 106/72 | HR 91 | Temp 97.8°F | Wt 135.0 lb

## 2016-12-28 DIAGNOSIS — I2510 Atherosclerotic heart disease of native coronary artery without angina pectoris: Secondary | ICD-10-CM

## 2016-12-28 DIAGNOSIS — K130 Diseases of lips: Secondary | ICD-10-CM | POA: Diagnosis not present

## 2016-12-28 MED ORDER — BETAMETHASONE DIPROPIONATE 0.05 % EX CREA: TOPICAL_CREAM | Freq: Two times a day (BID) | CUTANEOUS | 0 refills | Status: DC

## 2016-12-28 NOTE — Progress Notes (Signed)
   Subjective:    Patient ID: [name] [name], female    DOB: Jul 20, 1930, 83 y.o.   MRN: [id number]  HPI This is an 83 yo female who presents today with sores on the corners of her mouth. Noticed about 2 weeks ago. Seems to wax and wane. Was better after not using lipstick yesterday.    Past Medical History:  Diagnosis Date  . Allergic rhinitis   . Allergy   . CAD (coronary artery disease)    Mild CAD by cath 2008  . DJD (degenerative joint disease)    rheumatoid  . DVT (deep venous thrombosis) ([place])   . Dyslipidemia   . History of nuclear stress test    Myoview 5/17:  EF 74%, normal perfusion, low risk  . History of pulmonary embolism   . Hyperlipidemia   . Insomnia   . Osteoporosis   . Pulmonary fibrosis ([place])   . Rheumatoid arthritis [facility])    Past Surgical History:  Procedure Laterality Date  . ABDOMINAL HYSTERECTOMY  1974  . APPENDECTOMY  1959  . CATARACT EXTRACTION    . LEFT HEART CATHETERIZATION WITH CORONARY ANGIOGRAM N/A 06/18/2013   Procedure: LEFT HEART CATHETERIZATION WITH CORONARY ANGIOGRAM;  Surgeon: Peter M Martinique, MD;  Location: [facility] CATH LAB;  Service: Cardiovascular;  Laterality: N/A;  . TONSILLECTOMY  1941, 1951  . TOTAL KNEE ARTHROPLASTY  1997, 2007   Bilateral   Family History  Problem Relation Age of Onset  . Kidney disease Daughter 5  . Cancer Daughter   . Heart attack Father 60  . Alzheimer's disease Sister   . Heart disease Sister   . Alzheimer's disease Sister    Social History  Substance Use Topics  . Smoking status: Former Smoker    Packs/day: 0.30    Years: 20.00    Types: Cigarettes    Quit date: 07/11/1973  . Smokeless tobacco: Never Used  . Alcohol use No      Review of Systems Per HPI    Objective:   Physical Exam  Constitutional: She appears well-developed and well-nourished. No distress.  HENT:  Head: Normocephalic and atraumatic.  Corners of mouth with irritation, cracking. She is wearing heavy lipstick.   Eyes:  Conjunctivae are normal.  Neurological: She is alert.  Skin: Skin is warm and dry. She is not diaphoretic.  Psychiatric: She has a normal mood and affect. Her behavior is normal. Judgment and thought content normal.  Vitals reviewed.     BP 106/72 (BP Location: Left Arm, Patient Position: Sitting, Cuff Size: Normal)   Pulse 91   Temp 97.8 F (36.6 C) (Oral)   Wt 135 lb (61.2 kg)   SpO2 93%   BMI 22.81 kg/m  Wt Readings from Last 3 Encounters:  12/28/16 135 lb (61.2 kg)  11/15/16 134 lb (60.8 kg)  10/24/16 135 lb (61.2 kg)       Assessment & Plan:  1. Angular cheilitis - Provided written and verbal information regarding diagnosis and treatment. - avoid lipstick and lipbalm, can use petroleum jelly in addition to steroid cream - follow up if no improvement in 7 days - betamethasone dipropionate (DIPROLENE) 0.05 % cream; Apply topically 2 (two) times daily. Apply sparingly for no more than 10 days.  Dispense: 30 g; Refill: 0   Clarene Reamer, FNP-BC  [facility] [place] [place]  12/28/2016 1:29 PM

## 2016-12-28 NOTE — Patient Instructions (Signed)
Please avoid any type of lip stick, lip balm as much as possible  Use medicated cream twice a day for a maximum of 10 days, can also use plain petroleum jelly as needed to moisturize  If no better in 7-10 days, please let me know

## 2017-01-02 DIAGNOSIS — R51 Headache: Secondary | ICD-10-CM | POA: Diagnosis not present

## 2017-01-02 DIAGNOSIS — H04121 Dry eye syndrome of right lacrimal gland: Secondary | ICD-10-CM | POA: Diagnosis not present

## 2017-01-02 DIAGNOSIS — H01002 Unspecified blepharitis right lower eyelid: Secondary | ICD-10-CM | POA: Diagnosis not present

## 2017-01-02 DIAGNOSIS — H01001 Unspecified blepharitis right upper eyelid: Secondary | ICD-10-CM | POA: Diagnosis not present

## 2017-01-18 DIAGNOSIS — M0579 Rheumatoid arthritis with rheumatoid factor of multiple sites without organ or systems involvement: Secondary | ICD-10-CM | POA: Diagnosis not present

## 2017-01-24 DIAGNOSIS — R8271 Bacteriuria: Secondary | ICD-10-CM | POA: Diagnosis not present

## 2017-01-24 DIAGNOSIS — N3281 Overactive bladder: Secondary | ICD-10-CM | POA: Diagnosis not present

## 2017-01-30 ENCOUNTER — Other Ambulatory Visit (INDEPENDENT_AMBULATORY_CARE_PROVIDER_SITE_OTHER): Payer: Medicare Other

## 2017-01-30 ENCOUNTER — Ambulatory Visit (INDEPENDENT_AMBULATORY_CARE_PROVIDER_SITE_OTHER): Payer: Medicare Other | Admitting: Pulmonary Disease

## 2017-01-30 ENCOUNTER — Encounter: Payer: Self-pay | Admitting: Pulmonary Disease

## 2017-01-30 VITALS — BP 98/60 | HR 86 | Ht 65.0 in | Wt 137.0 lb

## 2017-01-30 DIAGNOSIS — Z86718 Personal history of other venous thrombosis and embolism: Secondary | ICD-10-CM | POA: Diagnosis not present

## 2017-01-30 DIAGNOSIS — M069 Rheumatoid arthritis, unspecified: Secondary | ICD-10-CM | POA: Diagnosis not present

## 2017-01-30 DIAGNOSIS — R0600 Dyspnea, unspecified: Secondary | ICD-10-CM

## 2017-01-30 DIAGNOSIS — M0510 Rheumatoid lung disease with rheumatoid arthritis of unspecified site: Secondary | ICD-10-CM | POA: Diagnosis not present

## 2017-01-30 DIAGNOSIS — I2510 Atherosclerotic heart disease of native coronary artery without angina pectoris: Secondary | ICD-10-CM | POA: Diagnosis not present

## 2017-01-30 DIAGNOSIS — J8410 Pulmonary fibrosis, unspecified: Secondary | ICD-10-CM

## 2017-01-30 LAB — PULMONARY FUNCTION TEST
DL/VA % PRED: 50 %
DL/VA: 2.51 ml/min/mmHg/L
DLCO UNC % PRED: 27 %
DLCO UNC: 7.14 ml/min/mmHg
DLCO cor % pred: 27 %
DLCO cor: 6.92 ml/min/mmHg
FEF 25-75 PRE: 1.32 L/s
FEF 25-75 Post: 1.81 L/sec
FEF2575-%CHANGE-POST: 36 %
FEF2575-%PRED-POST: 148 %
FEF2575-%Pred-Pre: 108 %
FEV1-%CHANGE-POST: 7 %
FEV1-%PRED-PRE: 97 %
FEV1-%Pred-Post: 105 %
FEV1-Post: 1.61 L
FEV1-Pre: 1.5 L
FEV1FVC-%Change-Post: 7 %
FEV1FVC-%Pred-Pre: 107 %
FEV6-%Change-Post: 0 %
FEV6-%PRED-POST: 100 %
FEV6-%Pred-Pre: 100 %
FEV6-POST: 1.88 L
FEV6-PRE: 1.89 L
FEV6FVC-%PRED-POST: 105 %
FEV6FVC-%PRED-PRE: 105 %
FVC-%CHANGE-POST: 0 %
FVC-%PRED-PRE: 95 %
FVC-%Pred-Post: 95 %
FVC-POST: 1.88 L
FVC-PRE: 1.89 L
PRE FEV6/FVC RATIO: 100 %
Post FEV1/FVC ratio: 85 %
Post FEV6/FVC ratio: 100 %
Pre FEV1/FVC ratio: 79 %
RV % PRED: 61 %
RV: 1.56 L
TLC % pred: 64 %
TLC: 3.37 L

## 2017-01-30 LAB — CBC WITH DIFFERENTIAL/PLATELET
Basophils Absolute: 0.1 10*3/uL (ref 0.0–0.1)
Basophils Relative: 0.9 % (ref 0.0–3.0)
EOS PCT: 0.6 % (ref 0.0–5.0)
Eosinophils Absolute: 0.1 10*3/uL (ref 0.0–0.7)
HCT: 42.8 % (ref 36.0–46.0)
Hemoglobin: 14.3 g/dL (ref 12.0–15.0)
LYMPHS ABS: 3.6 10*3/uL (ref 0.7–4.0)
Lymphocytes Relative: 35.3 % (ref 12.0–46.0)
MCHC: 33.3 g/dL (ref 30.0–36.0)
MCV: 89.9 fl (ref 78.0–100.0)
MONO ABS: 0.7 10*3/uL (ref 0.1–1.0)
MONOS PCT: 7 % (ref 3.0–12.0)
NEUTROS ABS: 5.8 10*3/uL (ref 1.4–7.7)
NEUTROS PCT: 56.2 % (ref 43.0–77.0)
PLATELETS: 198 10*3/uL (ref 150.0–400.0)
RBC: 4.77 Mil/uL (ref 3.87–5.11)
RDW: 13.4 % (ref 11.5–15.5)
WBC: 10.3 10*3/uL (ref 4.0–10.5)

## 2017-01-30 LAB — BRAIN NATRIURETIC PEPTIDE: PRO B NATRI PEPTIDE: 20 pg/mL (ref 0.0–100.0)

## 2017-01-30 NOTE — Patient Instructions (Signed)
For your shortness of breath and abnormal lung function test: We are going to check a complete blood count, brain natruretic peptide, echocardiogram We will also check a nuclear test of your lungs to make sure you don't have chronic blood clot Based on the results of these testing we'll have you come back in 2 weeks and we'll go over those results

## 2017-01-30 NOTE — Progress Notes (Signed)
Subjective:    Patient ID: [name] [name], female    DOB: 1931/06/01, 83 y.o.   MRN: [id number]  Synopsis: This is a very pleasant female who is diagnosed with interstitial lung disease in her mid 66s after evaluation at [facility] and [facility]. She was found to have a UIP pattern on CT scan and serology testing revealed a very high rheumatoid factor as well as anti-CCP antibody. She was followed by Dr. Elgie Collard at [facility] who felt like she had rheumatoid arthritis associated interstitial lung disease. He never treated her with immunosuppressive therapy. From 2000 07/09/2013 she's continued to follow in both [place] and due to her lung function has remained stable during that time. She has not developed new shortness of breath. She has had repeated episodes of pulmonary emboli. The most recent was in the summer of 2014 when she developed her fourth pulmonary embolism after returning from a trip to [place].   HPI Chief Complaint  Patient presents with  . Follow-up    review PFT.  pt c/o gradually increasing sob with exertion.     [name] [name] says that she feels like she is having a few more problems breathing and has some tightness in her chest. This occurs when she is walking to the mailbox. She doesn't want to be tired or breathing.  She has some ankle swelling which is worse, some chest tightness when she walks. She denies cough. She says that the leg swelling has been worsening over time. In general she says that her shortness of breath has been gradually worsening since the last visit. No hemoptysis.  Past Medical History:  Diagnosis Date  . Allergic rhinitis   . Allergy   . CAD (coronary artery disease)    Mild CAD by cath 2008  . DJD (degenerative joint disease)    rheumatoid  . DVT (deep venous thrombosis) ([place])   . Dyslipidemia   . History of nuclear stress test    Myoview 5/17:  EF 74%, normal perfusion, low risk  . History of pulmonary  embolism   . Hyperlipidemia   . Insomnia   . Osteoporosis   . Pulmonary fibrosis ([place])   . Rheumatoid arthritis ([place])        Review of Systems  Constitutional: Negative for chills, fatigue and fever.  HENT: Negative for congestion, postnasal drip and rhinorrhea.   Respiratory: Positive for cough and chest tightness. Negative for shortness of breath and wheezing.   Cardiovascular: Negative for chest pain, palpitations and leg swelling.       Objective:   Physical Exam  Vitals:   01/30/17 1205  BP: 98/60  Pulse: 86  SpO2: 97%  Weight: 137 lb (62.1 kg)  Height: 5\' 5"  (1.651 m)  RA  Gen: well appearing HENT: OP clear, TM's clear, neck supple PULM: Crackles bases B, normal percussion CV: RRR, no mgr, trace edema GI: BS+, soft, nontender Derm: ankle swelling noted, no cyanosis or rash Psyche: normal mood and affect  PFT July 2017 pulmonary function testing FVC 1.81 L 90% predicted, total lung capacity 3.79 L 72% predicted, DLCO 8.29 32% predicted. July 2018: Ratio 85%, FVC 1.88 L, 95% predicted, total lung capacity 3.37 L 64% predicted, DLCO 7.14 27 percent predicted  6MW July 2017 6 minute walk distance 324 m, O2 saturation nadir 93% on room air     BMET    Component Value Date/Time   NA 140 10/24/2016 1634   K 4.0 10/24/2016  1634   CL 106 10/24/2016 1634   CO2 28 10/24/2016 1634   GLUCOSE 105 (H) 10/24/2016 1634   BUN 13 10/24/2016 1634   CREATININE 0.75 10/24/2016 1634   CREATININE 0.74 11/30/2015 1327   CALCIUM 9.4 10/24/2016 1634   GFRNONAA 78 (L) 06/18/2013 0613   GFRAA >90 06/18/2013 0613   CBC    Component Value Date/Time   WBC 9.4 10/24/2016 1634   RBC 4.40 10/24/2016 1634   HGB 13.6 10/24/2016 1634   HCT 40.2 10/24/2016 1634   PLT 203.0 10/24/2016 1634   MCV 91.4 10/24/2016 1634   MCV 94.8 04/30/2013 1631   MCH 30.1 06/19/2013 0515   MCHC 33.8 10/24/2016 1634   RDW 13.3 10/24/2016 1634   LYMPHSABS 3.4 06/17/2013 1030   MONOABS 1.0  06/17/2013 1030   EOSABS 0.2 06/17/2013 1030   BASOSABS 0.0 06/17/2013 1030        Assessment & Plan:   Dyspnea, unspecified type - Plan: B Nat Peptide, CBC w/Diff, ECHOCARDIOGRAM COMPLETE, DG Chest 2 View, CANCELED: NM Pulmonary Per & Vent  Personal history of DVT (deep vein thrombosis) - Plan: DG Chest 2 View, NM Pulmonary Per & Vent, CANCELED: NM Pulmonary Per & Vent  Postinflammatory pulmonary fibrosis (HCC)  Rheumatoid arthritis involving multiple sites, unspecified rheumatoid factor presence (HCC)  Rheumatoid lung disease with rheumatoid arthritis ([place])   Discussion: Ely has a complicated problem. She has a history of recurrent pulmonary emboli but we've been unable to chronically anticoagulate her because of multiple falls. She also has usual interstitial pneumonitis associated with rheumatoid arthritis. So she does not have IPF but she has UIP secondary to her rheumatoid arthritis. Fortunately we've not seen progression of a UIP on CT scanning of her lungs every time.  That said, she said progressive worsening shortness of breath associated with increasing ankle swelling and a decreasing diffusion capacity on the CT scan of her chest. Given the relative preservation of her total lung capacity I'm hopeful that this finding is not due to worsening fibrosis, however I worry that this may be due to worsening pulmonary vascular disease. Obviously, we need to get a CBC to make sure that anemia is not causing this.  We will perform an echocardiogram, BNP, CBC, and VQ scan to evaluate for pulmonary hypertension and chronic thromboembolic pulmonary hypertension. If these tests are suggestive of pulmonary hypertension then she will need to have a right heart catheterization. However, if they're normal then we will need to consider treatment of her pulmonary fibrosis with immunosuppression.  Plan: For your shortness of breath and abnormal lung function test: We are going to check a  complete blood count, brain natruretic peptide, echocardiogram We will also check a nuclear test of your lungs to make sure you don't have chronic blood clot Based on the results of these testing we'll have you come back in 2 weeks and we'll go over those results  Updated Medication List Outpatient Encounter Prescriptions as of 01/30/2017  Medication Sig  . ALPRAZolam (XANAX) 0.25 MG tablet Take 1 tablet (0.25 mg total) by mouth at bedtime as needed for anxiety.  [name] aspirin EC 81 MG tablet Take 1 tablet (81 mg total) by mouth daily.  [name] atorvastatin (LIPITOR) 10 MG tablet Take 1 tablet (10 mg total) by mouth daily.  . cetirizine (ZYRTEC) 10 MG tablet Take 5-10 mg by mouth daily as needed for allergies.  . Cholecalciferol (VITAMIN D3) 2000 UNITS capsule Take 1 capsule (2,000 Units total) by mouth  daily.  . enoxaparin (LOVENOX) 30 MG/0.3ML injection Inject 0.3 mLs (30 mg total) into the skin every 12 (twelve) hours as needed (travel only).  . fluticasone (FLONASE) 50 MCG/ACT nasal spray Place 2 sprays into both nostrils daily as needed for allergies or rhinitis.  . furosemide (LASIX) 20 MG tablet Take one tablet by mouth daily as needed for swelling  . InFLIXimab (REMICADE IV) Inject into the vein.  [name] latanoprost (XALATAN) 0.005 % ophthalmic solution Place 1 drop into both eyes at bedtime.   [name] Glycol-Propyl Glycol (SYSTANE) 0.4-0.3 % SOLN Apply 1 drop to eye daily as needed (for dry eyes).  . predniSONE (DELTASONE) 5 MG tablet Take 5 mg by mouth daily.  . [DISCONTINUED] betamethasone dipropionate (DIPROLENE) 0.05 % cream Apply topically 2 (two) times daily. Apply sparingly for no more than 10 days. (Patient not taking: Reported on 01/30/2017)   No facility-administered encounter medications on file as of 01/30/2017.

## 2017-01-30 NOTE — Progress Notes (Signed)
PFT done today. 

## 2017-02-02 ENCOUNTER — Ambulatory Visit (HOSPITAL_COMMUNITY)
Admission: RE | Admit: 2017-02-02 | Discharge: 2017-02-02 | Disposition: A | Discharge status: Home / Self Care | Payer: Medicare Other | Source: Ambulatory Visit | Attending: Pulmonary Disease | Admitting: Pulmonary Disease

## 2017-02-02 ENCOUNTER — Encounter (HOSPITAL_COMMUNITY)
Admission: RE | Admit: 2017-02-02 | Discharge: 2017-02-02 | Disposition: A | Discharge status: Home / Self Care | Payer: Medicare Other | Source: Ambulatory Visit | Attending: Pulmonary Disease | Admitting: Pulmonary Disease

## 2017-02-02 DIAGNOSIS — J449 Chronic obstructive pulmonary disease, unspecified: Secondary | ICD-10-CM | POA: Diagnosis not present

## 2017-02-02 DIAGNOSIS — J849 Interstitial pulmonary disease, unspecified: Secondary | ICD-10-CM | POA: Diagnosis not present

## 2017-02-02 DIAGNOSIS — M0579 Rheumatoid arthritis with rheumatoid factor of multiple sites without organ or systems involvement: Secondary | ICD-10-CM | POA: Diagnosis not present

## 2017-02-02 DIAGNOSIS — Z86718 Personal history of other venous thrombosis and embolism: Secondary | ICD-10-CM

## 2017-02-02 DIAGNOSIS — I2699 Other pulmonary embolism without acute cor pulmonale: Secondary | ICD-10-CM | POA: Diagnosis not present

## 2017-02-02 DIAGNOSIS — J984 Other disorders of lung: Secondary | ICD-10-CM | POA: Insufficient documentation

## 2017-02-02 DIAGNOSIS — R0600 Dyspnea, unspecified: Secondary | ICD-10-CM | POA: Diagnosis not present

## 2017-02-02 DIAGNOSIS — Z79899 Other long term (current) drug therapy: Secondary | ICD-10-CM | POA: Diagnosis not present

## 2017-02-02 DIAGNOSIS — Z6823 Body mass index (BMI) 23.0-23.9, adult: Secondary | ICD-10-CM | POA: Diagnosis not present

## 2017-02-02 DIAGNOSIS — M2550 Pain in unspecified joint: Secondary | ICD-10-CM | POA: Diagnosis not present

## 2017-02-02 DIAGNOSIS — R0602 Shortness of breath: Secondary | ICD-10-CM | POA: Diagnosis not present

## 2017-02-02 MED ORDER — TECHNETIUM TO 99M ALBUMIN AGGREGATED
4.1000 | Freq: Once | INTRAVENOUS | Status: AC
  Administered 2017-02-02: 4.1 via INTRAVENOUS

## 2017-02-02 MED ORDER — TECHNETIUM TC 99M DIETHYLENETRIAME-PENTAACETIC ACID
32.5000 | Freq: Once | INTRAVENOUS | Status: AC
  Administered 2017-02-02: 32.5 via RESPIRATORY_TRACT

## 2017-02-03 ENCOUNTER — Other Ambulatory Visit: Payer: Self-pay

## 2017-02-03 ENCOUNTER — Ambulatory Visit (HOSPITAL_COMMUNITY): Payer: Medicare Other | Attending: Cardiology

## 2017-02-03 DIAGNOSIS — I2510 Atherosclerotic heart disease of native coronary artery without angina pectoris: Secondary | ICD-10-CM | POA: Diagnosis not present

## 2017-02-03 DIAGNOSIS — R0600 Dyspnea, unspecified: Secondary | ICD-10-CM | POA: Diagnosis not present

## 2017-02-03 DIAGNOSIS — J84112 Idiopathic pulmonary fibrosis: Secondary | ICD-10-CM | POA: Insufficient documentation

## 2017-02-03 DIAGNOSIS — I358 Other nonrheumatic aortic valve disorders: Secondary | ICD-10-CM | POA: Diagnosis not present

## 2017-02-03 DIAGNOSIS — E785 Hyperlipidemia, unspecified: Secondary | ICD-10-CM | POA: Diagnosis not present

## 2017-02-13 ENCOUNTER — Ambulatory Visit: Payer: Medicare Other | Admitting: Acute Care

## 2017-02-27 ENCOUNTER — Ambulatory Visit (INDEPENDENT_AMBULATORY_CARE_PROVIDER_SITE_OTHER): Payer: Medicare Other | Admitting: Acute Care

## 2017-02-27 ENCOUNTER — Telehealth: Payer: Self-pay | Admitting: Acute Care

## 2017-02-27 ENCOUNTER — Encounter: Payer: Self-pay | Admitting: Acute Care

## 2017-02-27 DIAGNOSIS — I2510 Atherosclerotic heart disease of native coronary artery without angina pectoris: Secondary | ICD-10-CM

## 2017-02-27 DIAGNOSIS — J8410 Pulmonary fibrosis, unspecified: Secondary | ICD-10-CM | POA: Diagnosis not present

## 2017-02-27 NOTE — Assessment & Plan Note (Signed)
Seen for progressive dyspnea 01/30/2017 Lab work and diagnostics essentially negative Plan We will increase your prednisone to 7.5 mg daily each morning. Follow up in 1 month with Judson Roch NP or Dr. Lake Bells to see if your shortness of breath is better. Please contact office for sooner follow up if symptoms do not improve or worsen or seek emergency care

## 2017-02-27 NOTE — Progress Notes (Signed)
History of Present Illness [name] [name] is a 83 y.o. female with  Rheumatoid Arthritis  Associated with ILD . She is follopwed by Dr. Lake Bells   Synopsis: This is a very pleasant female who is diagnosed with interstitial lung disease in her mid 28s after evaluation at [facility] and [facility]. She was found to have a UIP pattern on CT scan and serology testing revealed a very high rheumatoid factor as well as anti-CCP antibody. She was followed by Dr. Elgie Collard at [facility] who felt like she had rheumatoid arthritis associated interstitial lung disease. He never treated her with immunosuppressive therapy. From 2000 07/09/2013 she's continued to follow in both [place] and due to her lung function has remained stable during that time. She has not developed new shortness of breath. She has had repeated episodes of pulmonary emboli. The most recent was in the summer of 2014 when she developed her fourth pulmonary embolism after returning from a trip to [place].     02/27/2017 Follow up for dyspnea Pt presents for follow-up. She was originally seen by Dr. Lake Bells 01/30/2017. At that time she had complaints of more breathing problems and tightness in her chest. She stated this occurred primarily with exertion. She has some lower extremity edema that has been worsening gradually over time. She denies cough. Dr. Anastasia Pall plan after the 01/30/2017 office visit was as follows :  We will perform an echocardiogram, BNP, CBC, and VQ scan to evaluate for pulmonary hypertension and chronic thromboembolic pulmonary hypertension. If these tests are suggestive of pulmonary hypertension then she will need to have a right heart catheterization. However, if they're normal then we will need to consider treatment of her pulmonary fibrosis with immunosuppression.  Plan: For your shortness of breath and abnormal lung function test: We are going to check a complete blood  count, brain natruretic peptide, echocardiogram We will also check a nuclear test of your lungs to make sure you don't have chronic blood clot Based on the results of these testing we'll have you come back in 2 weeks and we'll go over those results.  At follow-up today the patient is in no apparent distress, but states that her dyspnea varies from day to day. Of note, the patient states that she has started getting Remicade infusions for her rheumatoid arthritis every other month. At the time her infusions were started her prednisone dosing was decreased from 10 mg daily to 5 mg daily. She states she thinks her increasing shortness of breath correlated with this decrease in prednisone. She denies fever, chest pain, orthopnea, or hemoptysis. She does have some gradual ankle swelling that she states has been ongoing for about the last year. VQ scan  was indeterminate Due to severe chronic interstitial lung disease/COPD. She denies any recent air or automobile travel. Eyes any leg pain.    She was decreased to 5 mg of prednisone from 10 mg prednisone and she feels this worsening SOB may correlate  Will increase to 7.5 mg  May need to go up to 10 if 7.5 shows no improvement She gets remicaid infusions for her RA every other month.  When infusions started they decreased her prednisone  Message McQuaid regarding what he wants to do. No fever, chest pain,orthopnea or hemoptysis. Some ankle swelling left = to right.  Test Results: July 2018: Ratio 85%, FVC 1.88 L, 95% predicted, total lung capacity 3.37 L 64% predicted, DLCO 7.14 27 percent predicted  VQ  Scan: 02/02/2017 IMPRESSION: Indeterminate scan for pulmonary embolus. Exam is limited due to severe chronic interstitial lung disease/COPD present. 6MW  July 2017 6 minute walk distance 324 m, O2 saturation nadir 93% on room air  2-D echo>> 02/03/2017: EF 02-54% grade 1 diastolic dysfunction   CBC Latest Ref Rng & Units 01/30/2017 10/24/2016  03/17/2015  WBC 4.0 - 10.5 K/uL 10.3 9.4 10.4  Hemoglobin 12.0 - 15.0 g/dL 14.3 13.6 14.5  Hematocrit 36.0 - 46.0 % 42.8 40.2 43.8  Platelets 150.0 - 400.0 K/uL 198.0 203.0 221.0    BMP Latest Ref Rng & Units 10/24/2016 08/05/2016 11/30/2015  Glucose 70 - 99 mg/dL 105(H) 106(H) 93  BUN 6 - 23 mg/dL 13 14 6(L)  Creatinine 0.40 - 1.20 mg/dL 0.75 0.78 0.74  Sodium 135 - 145 mEq/L 140 140 139  Potassium 3.5 - 5.1 mEq/L 4.0 4.3 4.5  Chloride 96 - 112 mEq/L 106 105 103  CO2 19 - 32 mEq/L 28 30 26   Calcium 8.4 - 10.5 mg/dL 9.4 9.4 9.6    ProBNP    Component Value Date/Time   PROBNP 20.0 01/30/2017 1245    PFT    Component Value Date/Time   FEV1PRE 1.50 01/30/2017 1104   FEV1POST 1.61 01/30/2017 1104   FVCPRE 1.89 01/30/2017 1104   FVCPOST 1.88 01/30/2017 1104   TLC 3.37 01/30/2017 1104   DLCOUNC 7.14 01/30/2017 1104   PREFEV1FVCRT 79 01/30/2017 1104   PSTFEV1FVCRT 85 01/30/2017 1104    Dg Chest 2 View  Result Date: 02/02/2017 CLINICAL DATA:  Shortness of breath. History of DVT. Pulmonary fibrosis . EXAM: CHEST  2 VIEW COMPARISON:  CT 08/08/2016 .  Chest x-ray 08/05/2016, 02/23/2016 . FINDINGS: Heart size normal. Diffuse bilateral interstitial prominence consistent with chronic interstitial lung disease. Bilateral pleural thickening noted consistent scarring . No significant change from prior exam. COPD. Heart size normal . IMPRESSION: Findings consistent with severe chronic interstitial lung disease and bilateral pleural scarring. COPD. No significant change from prior exam . Electronically Signed   By: Marcello Moores  Register   On: 02/02/2017 11:21   Nm Pulmonary Per & Vent  Result Date: 02/02/2017 CLINICAL DATA:  History of DVT. EXAM: NUCLEAR MEDICINE VENTILATION - PERFUSION LUNG SCAN TECHNIQUE: Ventilation images were obtained in multiple projections using inhaled aerosol Tc-29m DTPA. Perfusion images were obtained in multiple projections after intravenous injection of Tc-45m MAA.  RADIOPHARMACEUTICALS:  32.5 mCi Technetium-49m DTPA aerosol inhalation and 4.1 mCi Technetium-69m MAA IV COMPARISON:  Chest x-ray 02/02/2017, 12/01/2015. FINDINGS: Poor ventilation bilaterally. Small perfusion defects noted bilaterally. This scan is indeterminate for pulmonary embolus. Changes present may be related to severe chronic interstitial lung disease/COPD present. IMPRESSION: Indeterminate scan for pulmonary embolus. Exam is limited due to severe chronic interstitial lung disease/COPD present. Electronically Signed   By: Marcello Moores  Register   On: 02/02/2017 11:20     Past medical hx Past Medical History:  Diagnosis Date  . Allergic rhinitis   . Allergy   . CAD (coronary artery disease)    Mild CAD by cath 2008  . DJD (degenerative joint disease)    rheumatoid  . DVT (deep venous thrombosis) ([place])   . Dyslipidemia   . History of nuclear stress test    Myoview 5/17:  EF 74%, normal perfusion, low risk  . History of pulmonary embolism   . Hyperlipidemia   . Insomnia   . Osteoporosis   . Pulmonary fibrosis ([place])   . Rheumatoid arthritis ([place])      Social  History  Substance Use Topics  . Smoking status: Former Smoker    Packs/day: 0.30    Years: 20.00    Types: Cigarettes    Quit date: 07/11/1973  . Smokeless tobacco: Never Used  . Alcohol use No    Ms.Holliman reports that she quit smoking about 43 years ago. Her smoking use included Cigarettes. She has a 6.00 pack-year smoking history. She has never used smokeless tobacco. She reports that she does not drink alcohol or use drugs.  Tobacco Cessation: Former smoker, quit in 1975  Past surgical hx, Family hx, Social hx all reviewed.  Current Outpatient Prescriptions on File Prior to Visit  Medication Sig  . ALPRAZolam (XANAX) 0.25 MG tablet Take 1 tablet (0.25 mg total) by mouth at bedtime as needed for anxiety.  [name] aspirin EC 81 MG tablet Take 1 tablet (81 mg total) by mouth daily.  [name] atorvastatin (LIPITOR) 10 MG tablet Take  1 tablet (10 mg total) by mouth daily.  . cetirizine (ZYRTEC) 10 MG tablet Take 5-10 mg by mouth daily as needed for allergies.  . Cholecalciferol (VITAMIN D3) 2000 UNITS capsule Take 1 capsule (2,000 Units total) by mouth daily.  [name] enoxaparin (LOVENOX) 30 MG/0.3ML injection Inject 0.3 mLs (30 mg total) into the skin every 12 (twelve) hours as needed (travel only).  . fluticasone (FLONASE) 50 MCG/ACT nasal spray Place 2 sprays into both nostrils daily as needed for allergies or rhinitis.  . furosemide (LASIX) 20 MG tablet Take one tablet by mouth daily as needed for swelling  . InFLIXimab (REMICADE IV) Inject into the vein.  [name] latanoprost (XALATAN) 0.005 % ophthalmic solution Place 1 drop into both eyes at bedtime.   [name] Glycol-Propyl Glycol (SYSTANE) 0.4-0.3 % SOLN Apply 1 drop to eye daily as needed (for dry eyes).  . predniSONE (DELTASONE) 5 MG tablet Take 5 mg by mouth daily.   No current facility-administered medications on file prior to visit.      Allergies  Allergen Reactions  . Crestor [Rosuvastatin] Other (See Comments)    Muscle pain  . Lactose Intolerance (Gi) Other (See Comments)    Stomach upset  . Penicillins Hives  . Niacin Hives    Review Of Systems:  Constitutional:   No  weight loss, night sweats,  Fevers, chills,+fatigue, or  +lassitude.  HEENT:   No headaches,  Difficulty swallowing,  Tooth/dental problems, or  Sore throat,                No sneezing, itching, ear ache, nasal congestion, post nasal drip,   CV:  No chest pain,  Orthopnea, PND, +swelling in lower extremities, no anasarca, dizziness, palpitations, syncope.   GI  No heartburn, indigestion, abdominal pain, nausea, vomiting, diarrhea, change in bowel habits, loss of appetite, bloody stools.   Resp:  + shortness of breath with exertion less at rest.  No excess mucus, no productive cough,  No non-productive cough,  No coughing up of blood.  No change in color of mucus.  No wheezing.  No chest  wall deformity  Skin: no rash or lesions.  GU: no dysuria, change in color of urine, no urgency or frequency.  No flank pain, no hematuria   MS:  No joint pain or swelling.  No decreased range of motion.  No back pain.  Psych:  No change in mood or affect. No depression or anxiety.  No memory loss.   Vital Signs BP 110/72 (BP Location: Left Arm, Cuff Size: Normal)  Pulse 75   Ht 5' 4.5" (1.638 m)   Wt 134 lb (60.8 kg)   SpO2 97%   BMI 22.65 kg/m    Physical Exam:  General- No distress,  A&Ox3, pleasant ENT: No sinus tenderness, TM clear, pale nasal mucosa, no oral exudate,no post nasal drip, no LAN Cardiac: S1, S2, regular rate and rhythm, no murmur Chest: No wheeze/ rales/ dullness; no accessory muscle use, no nasal flaring, no sternal retractions Abd.: Soft Non-tender, nondistended, bowel sounds positive Ext: No clubbing cyanosis, trace edema bilateral lower extremities  Neuro:  normal strength, cranial nerves intact Skin: No rashes, warm and dry Psych: normal mood and behavior   Assessment/Plan  Postinflammatory pulmonary fibrosis (HCC) Seen for progressive dyspnea 01/30/2017 Lab work and diagnostics essentially negative Plan We will increase your prednisone to 7.5 mg daily each morning. Follow up in 1 month with Judson Roch NP or Dr. Lake Bells to see if your shortness of breath is better. Please contact office for sooner follow up if symptoms do not improve or worsen or seek emergency care       Magdalen Spatz, NP 02/27/2017  5:00 PM

## 2017-02-27 NOTE — Telephone Encounter (Signed)
Dr. Lake Bells, I saw [name] [name] the office today. She is such a sweet lady. This was a follow-up from a 01/30/2017 visit with you. You had ordered CBC, BNP, echo, and VQ scan. The diagnostics resulted as follows : Her CBC did not indicate anemia. Her BNP was 20. Her echo did not indicate any significant problem (EF 60-65% with grade 1  diastolic dysfunction). Her VQ scan was indeterminate due to the fact she has ILD and COPD. As all these tests are essentially negative, I saw you're note that stated you were considering immunosuppressive therapy if these labs were essentially normal.. While talking to her she stated that she had started Remicade injections for her RA. She gets these every other month. Once these injections started her prednisone was decreased from 10 mg daily to 5 mg daily. In retrospect she thinks perhaps the worsening shortness of breath occurred at the same time as the decrease in the prednisone. My plan was to increase her prednisone to 7.5 mg daily. Reassess in 1 month. If she continues to have shortness of breath we can increase her to 10 mg at that time. I wasn't sure if she wanted to do a CT angiogram as the VQ scan was indeterminate. Also I wasn't sure if she wanted to use a medication other than prednisone for immunosuppression. Please advise. Thank you so much.

## 2017-02-27 NOTE — Patient Instructions (Addendum)
It is nice to meet you today. We will increase your prednisone to 7.5 mg daily each morning. Follow up in 1 month with Judson Roch NP or Dr. Lake Bells to see if your shortness of breath is better. Please contact office for sooner follow up if symptoms do not improve or worsen or seek emergency care

## 2017-03-06 NOTE — Telephone Encounter (Signed)
I don't think a repeat CT is necessary; agree with increasing prednisone, could be more aggressive and increase to 10mg  or 20mg  if the two of you feel comfortable.  Biologic therapy (remicade) unfortunately dosen't have any data in the literature to support its use for lung disease.

## 2017-03-06 NOTE — Progress Notes (Signed)
Reviewed, agree 

## 2017-03-07 NOTE — Telephone Encounter (Signed)
Thanks. I will check with her and see if the increase to 7.5 mg of prednisone has shown any  Improvement. We can be more aggressive if we need to be with the prednisone.

## 2017-03-08 ENCOUNTER — Encounter: Payer: Self-pay | Admitting: Family Medicine

## 2017-03-08 ENCOUNTER — Ambulatory Visit (INDEPENDENT_AMBULATORY_CARE_PROVIDER_SITE_OTHER): Payer: Medicare Other | Admitting: Family Medicine

## 2017-03-08 VITALS — BP 100/68 | HR 83 | Temp 98.5°F | Ht 64.5 in | Wt 134.0 lb

## 2017-03-08 DIAGNOSIS — K130 Diseases of lips: Secondary | ICD-10-CM

## 2017-03-08 MED ORDER — MUPIROCIN 2 % EX OINT: TOPICAL_OINTMENT | CUTANEOUS | 3 refills | Status: DC

## 2017-03-08 NOTE — Patient Instructions (Addendum)
Thank you for coming in,   Try the Bactroban for 7-14 days. After that has cleared apply the petroleum jelly. If this does not improve the your symptoms then you can apply over-the-counter hydrocortisone cream. If this does not help then please let us know.   Please feel free to call with any questions or concerns at any time, at [phone]. --Dr. Raeford Razor

## 2017-03-08 NOTE — Assessment & Plan Note (Signed)
This appears to be a recurrence of her previous problem. She had resolution which is barrier cream before. - Try Bactroban for 7-14 days. - If no improvement can try over-the-counter hydrocortisone cream - If that fails then would try an antifungal. - When she has resolution of her inflammation she can use a barrier cream

## 2017-03-08 NOTE — Progress Notes (Signed)
[name] [name] - 83 y.o. female MRN [id number]  Date of birth: 02-20-1931  SUBJECTIVE:  Including CC & ROS.  Chief Complaint  Patient presents with  . Mouth Lesions    was last seen for this issues 6/20 for same issues. patient states she is using vasoline and it gets better then comes back.    [name] [name] is an 83 yo F that is presenting lesions on the corners of her mouth. These have started recently. She did not use the steroid cream that was prescribed to her previously. She has been using Vaseline. She pain that is moderate in nature. Has not been using lipstick.    She was seen on 12/28/16 for similar problem. She was prescribed a steroid at that time.  Review of Systems  Constitutional: Negative for fever.  HENT: Positive for mouth sores.     HISTORY: Past Medical, Surgical, Social, and Family History Reviewed & Updated per EMR.   Pertinent Historical Findings include:  Past Medical History:  Diagnosis Date  . Allergic rhinitis   . Allergy   . CAD (coronary artery disease)    Mild CAD by cath 2008  . DJD (degenerative joint disease)    rheumatoid  . DVT (deep venous thrombosis) ([place])   . Dyslipidemia   . History of nuclear stress test    Myoview 5/17:  EF 74%, normal perfusion, low risk  . History of pulmonary embolism   . Hyperlipidemia   . Insomnia   . Osteoporosis   . Pulmonary fibrosis ([place])   . Rheumatoid arthritis [facility])     Past Surgical History:  Procedure Laterality Date  . ABDOMINAL HYSTERECTOMY  1974  . APPENDECTOMY  1959  . CATARACT EXTRACTION    . LEFT HEART CATHETERIZATION WITH CORONARY ANGIOGRAM N/A 06/18/2013   Procedure: LEFT HEART CATHETERIZATION WITH CORONARY ANGIOGRAM;  Surgeon: Peter M Martinique, MD;  Location: [facility] CATH LAB;  Service: Cardiovascular;  Laterality: N/A;  . TONSILLECTOMY  1941, 1951  . TOTAL KNEE ARTHROPLASTY  1997, 2007   Bilateral    Allergies  Allergen Reactions  . Crestor [Rosuvastatin] Other (See Comments)    Muscle pain  .  Lactose Intolerance (Gi) Other (See Comments)    Stomach upset  . Penicillins Hives  . Niacin Hives    Family History  Problem Relation Age of Onset  . Kidney disease Daughter 5  . Cancer Daughter   . Heart attack Father 55  . Alzheimer's disease Sister   . Heart disease Sister   . Alzheimer's disease Sister      Social History   Social History  . Marital status: Widowed    Spouse name: N/A  . Number of children: 2  . Years of education: N/A   Occupational History  . government Designer, television/film set     retired   Social History Main Topics  . Smoking status: Former Smoker    Packs/day: 0.30    Years: 20.00    Types: Cigarettes    Quit date: 07/11/1973  . Smokeless tobacco: Never Used  . Alcohol use No  . Drug use: No  . Sexual activity: No   Other Topics Concern  . Not on file   Social History Narrative   Diet:   Do you drink/eat things with caffeine? Yes   Marital status:        Widowed                      What  year were you married?1954   Do you live in a house, apartment, assisted living, condo, trailer, etc)? House   Is it one or more stories? 1 1/4   How many persons live in your home?1   Do you have any pets in your home? No   Current or past profession:  Publishing rights manager   Do you exercise?        Yes                                            Type & how often:  Walk                                    Do you have a living will? Yes   Do you have a DNR Form? Yes   Do you have a POA/HPOA forms?  Yes        PHYSICAL EXAM:  VS: BP 100/68 (BP Location: Left Arm, Patient Position: Sitting, Cuff Size: Normal)   Pulse 83   Temp 98.5 F (36.9 C) (Oral)   Ht 5' 4.5" (1.638 m)   Wt 134 lb (60.8 kg)   SpO2 98%   BMI 22.65 kg/m  Physical Exam Gen: NAD, alert, cooperative with exam, well-appearing ENT: lesions located in the lateral commissures of the mouth, normal nasal mucosa,  Eye: normal EOM, normal conjunctiva and lids CV:  no edema, +2  pedal pulses   Resp: no accessory muscle use, non-labored,  Skin: no rashes, no areas of induration  Neuro: normal tone, normal sensation to touch Psych:  normal insight, alert and oriented MSK: normal strength, normal gain     ASSESSMENT & PLAN:   Angular cheilitis This appears to be a recurrence of her previous problem. She had resolution which is barrier cream before. - Try Bactroban for 7-14 days. - If no improvement can try over-the-counter hydrocortisone cream - If that fails then would try an antifungal. - When she has resolution of her inflammation she can use a barrier cream

## 2017-03-15 DIAGNOSIS — M0579 Rheumatoid arthritis with rheumatoid factor of multiple sites without organ or systems involvement: Secondary | ICD-10-CM | POA: Diagnosis not present

## 2017-03-15 DIAGNOSIS — Z79899 Other long term (current) drug therapy: Secondary | ICD-10-CM | POA: Diagnosis not present

## 2017-03-30 ENCOUNTER — Telehealth: Payer: Self-pay | Admitting: Acute Care

## 2017-03-30 ENCOUNTER — Ambulatory Visit (INDEPENDENT_AMBULATORY_CARE_PROVIDER_SITE_OTHER): Payer: Medicare Other | Admitting: Acute Care

## 2017-03-30 ENCOUNTER — Encounter: Payer: Self-pay | Admitting: Acute Care

## 2017-03-30 VITALS — BP 104/62 | HR 96 | Ht 64.5 in | Wt 136.8 lb

## 2017-03-30 DIAGNOSIS — J8410 Pulmonary fibrosis, unspecified: Secondary | ICD-10-CM

## 2017-03-30 DIAGNOSIS — J9611 Chronic respiratory failure with hypoxia: Secondary | ICD-10-CM | POA: Diagnosis not present

## 2017-03-30 DIAGNOSIS — I2510 Atherosclerotic heart disease of native coronary artery without angina pectoris: Secondary | ICD-10-CM

## 2017-03-30 MED ORDER — PREDNISONE 10 MG PO TABS: 15.0000 mg | ORAL_TABLET | Freq: Every day | ORAL | 0 refills | Status: DC

## 2017-03-30 NOTE — Progress Notes (Signed)
History of Present Illness [name] [name] is a 83 y.o. female with Rheumatoid Arthritis  Associated with ILD . She is followed by Dr. Lake [name]   Synopsis: This is a very pleasant female who is diagnosed with interstitial lung disease in her mid 3s after [name] She was found to have a UIP pattern on CT scan and serology testing revealed a very high rheumatoid factor as well as anti-CCP antibody. She was followed by Dr. Elgie [name] at [name], [place] who felt like she had rheumatoid arthritis associated interstitial lung disease. He never treated her with immunosuppressive therapy. From 2000 07/09/2013 she's continued to follow in both [name] and due to her lung function has remained stable during that time. She has not developed new shortness of breath. She has had repeated episodes of pulmonary emboli. The most recent was in the summer of 2014 when she developed her fourth pulmonary embolism after returning from a trip to [place].  03/30/2017 Follow up OV: Pt. Presents for follow up. She was seen 02/27/2017 for worsening dyspnea.plan at that time was 2 increased her prednisone at that time to 7.5 mg from 5 mg daily.she's been compliant with this change. She states she did not notice any difference in her dyspnea. She does have some sinus and allergy issues. She is using her Flonase and Zyrtec. She states she has had a really hard month this month, as she has lost one of her best friends, and a sorority sister. Saturations today upon arriving to the exam room were 90% on room air. Patient denies fever, chest pain, orthopnea, or hemoptysis.  Test Results: 2-D echo>> 02/03/2017: EF 48-54% grade 1 diastolic dysfunction  VQ Scan: 02/02/2017 IMPRESSION: Indeterminate scan for pulmonary embolus. Exam is limited due to severe chronic interstitial lung disease/COPD present.  PFT July 2017 pulmonary function testing FVC 1.81 L 90%  predicted, total lung capacity 3.79 L 72% predicted, DLCO 8.29 32% predicted. July 2018: Ratio 85%, FVC 1.88 L, 95% predicted, total lung capacity 3.37 L 64% predicted, DLCO 7.14 27 percent predicted  6MW July 2017 6 minute walk distance 324 m, O2 saturation nadir 93% on room air   CBC Latest Ref Rng & Units 01/30/2017 10/24/2016 03/17/2015  WBC 4.0 - 10.5 K/uL 10.3 9.4 10.4  Hemoglobin 12.0 - 15.0 g/dL 14.3 13.6 14.5  Hematocrit 36.0 - 46.0 % 42.8 40.2 43.8  Platelets 150.0 - 400.0 K/uL 198.0 203.0 221.0    BMP Latest Ref Rng & Units 10/24/2016 08/05/2016 11/30/2015  Glucose 70 - 99 mg/dL 105(H) 106(H) 93  BUN 6 - 23 mg/dL 13 14 6(L)  Creatinine 0.40 - 1.20 mg/dL 0.75 0.78 0.74  Sodium 135 - 145 mEq/L 140 140 139  Potassium 3.5 - 5.1 mEq/L 4.0 4.3 4.5  Chloride 96 - 112 mEq/L 106 105 103  CO2 19 - 32 mEq/L 28 30 26   Calcium 8.4 - 10.5 mg/dL 9.4 9.4 9.6    ProBNP    Component Value Date/Time   PROBNP 20.0 01/30/2017 1245    PFT    Component Value Date/Time   FEV1PRE 1.50 01/30/2017 1104   FEV1POST 1.61 01/30/2017 1104   FVCPRE 1.89 01/30/2017 1104   FVCPOST 1.88 01/30/2017 1104   TLC 3.37 01/30/2017 1104   DLCOUNC 7.14 01/30/2017 1104   PREFEV1FVCRT 79 01/30/2017 1104   PSTFEV1FVCRT 85 01/30/2017 1104    No results found.   Past medical hx Past Medical History:  Diagnosis  Date  . Allergic rhinitis   . Allergy   . CAD (coronary artery disease)    Mild CAD by cath 2008  . DJD (degenerative joint disease)    rheumatoid  . DVT (deep venous thrombosis) ([name])   . Dyslipidemia   . History of nuclear stress test    Myoview 5/17:  EF 74%, normal perfusion, low risk  . History of pulmonary embolism   . Hyperlipidemia   . Insomnia   . Osteoporosis   . Pulmonary fibrosis ([name])   . Rheumatoid arthritis [facility])      Social History  Substance Use Topics  . Smoking status: Former Smoker    Packs/day: 0.30    Years: 20.00    Types: Cigarettes    Quit date: 07/11/1973    . Smokeless tobacco: Never Used  . Alcohol use No    Ms.[name] [name] reports that she quit smoking about 43 years ago. Her smoking use included Cigarettes. She has a 6.00 pack-year smoking history. She has never used smokeless tobacco. She reports that she does not drink alcohol or use drugs.  Tobacco Cessation: Former smoker, quit 1975 with a 6 pack year smoking history  Past surgical hx, Family hx, Social hx all reviewed.  Current Outpatient Prescriptions on File Prior to Visit  Medication Sig  . ALPRAZolam (XANAX) 0.25 MG tablet Take 1 tablet (0.25 mg total) by mouth at bedtime as needed for anxiety.  [name] aspirin EC 81 MG tablet Take 1 tablet (81 mg total) by mouth daily.  [name] atorvastatin (LIPITOR) 10 MG tablet Take 1 tablet (10 mg total) by mouth daily.  . cetirizine (ZYRTEC) 10 MG tablet Take 5-10 mg by mouth daily as needed for allergies.  . Cholecalciferol (VITAMIN D3) 2000 UNITS capsule Take 1 capsule (2,000 Units total) by mouth daily.  [name] enoxaparin (LOVENOX) 30 MG/0.3ML injection Inject 0.3 mLs (30 mg total) into the skin every 12 (twelve) hours as needed (travel only).  . fluticasone (FLONASE) 50 MCG/ACT nasal spray Place 2 sprays into both nostrils daily as needed for allergies or rhinitis.  . furosemide (LASIX) 20 MG tablet Take one tablet by mouth daily as needed for swelling  . InFLIXimab (REMICADE IV) Inject into the vein.  [name] latanoprost (XALATAN) 0.005 % ophthalmic solution Place 1 drop into both eyes at bedtime.   . mupirocin ointment (BACTROBAN) 2 % Apply to affected area TID for 7 days.  [name] Glycol-Propyl Glycol (SYSTANE) 0.4-0.3 % SOLN Apply 1 drop to eye daily as needed (for dry eyes).  . predniSONE (DELTASONE) 5 MG tablet Take 5 mg by mouth daily.   No current facility-administered medications on file prior to visit.      Allergies  Allergen Reactions  . Crestor [Rosuvastatin] Other (See Comments)    Muscle pain  . Lactose Intolerance (Gi) Other (See Comments)     Stomach upset  . Penicillins Hives  . Niacin Hives    Review Of Systems:  Constitutional:   No  weight loss, night sweats,  Fevers, chills, fatigue, or  lassitude.  HEENT:   No headaches,  Difficulty swallowing,  Tooth/dental problems, or  Sore throat,                No sneezing, itching, ear ache, nasal congestion, post nasal drip,   CV:  No chest pain,  Orthopnea, PND, swelling in lower extremities, anasarca, dizziness, palpitations, syncope.   GI  No heartburn, indigestion, abdominal pain, nausea, vomiting, diarrhea, change in bowel habits, loss  of appetite, bloody stools.   Resp: + shortness of breath with exertion or at rest.  No excess mucus, no productive cough,  No non-productive cough,  No coughing up of blood.  No change in color of mucus.  No wheezing.  No chest wall deformity  Skin: no rash or lesions.  GU: no dysuria, change in color of urine, no urgency or frequency.  No flank pain, no hematuria   MS:  No joint pain or swelling.  No decreased range of motion.  No back pain.  Psych:  No change in mood or affect. No depression or anxiety.  No memory loss.   Vital Signs BP 104/62 (BP Location: Left Arm, Cuff Size: Normal)   Pulse 96   Ht 5' 4.5" (1.638 m)   Wt 136 lb 12.8 oz (62.1 kg)   SpO2 90%   BMI 23.12 kg/m    Physical Exam:  General- No distress,  A&Ox3, very pleasant lady ENT: No sinus tenderness, TM clear, pale nasal mucosa, no oral exudate,no post nasal drip, no LAN Cardiac: S1, S2, regular rate and rhythm, no murmur Chest: No wheeze/ + crackles/ No dullness; no accessory muscle use, no nasal flaring, no sternal retractions Abd.: Soft Non-tender, Bob distended, BS + Ext: No clubbing cyanosis, trace bilateral LE edema Neuro:  normal strength, cranial nerves intact, alert oriented and appropriate Skin: No rashes, warm and dry Psych: normal mood and behavior   Assessment/Plan  Postinflammatory pulmonary fibrosis (HCC) No significant change in  exertional dyspnea with increase in prednisone to 7.5 mg Plan We will walk you today to make sure your oxygen levels are adequate. Your saturations dropped to 87% with exertion. We will order oxygen for you. Please wear oxygen at 2 L [place] with exertion.  You do not need to wear it while at rest. Continue your Zyrtec and Flonase as you have been doing. We will increase prednisone to 15 mg daily. Return in 1 month to see Judson Roch NP or Dr. Lake [name]  To make sure you are doing better.  If no significant improvement on increased in prednisone, we will determine additional diagnostic testing. Please contact office for sooner follow up if symptoms do not improve or worsen or seek emergency care    Chronic respiratory failure with hypoxia ([place]) We will order oxygen to be worn at 2 L nasal cannula with exertion No need to wear at rest Goal is to maintain saturations greater than 90%    Magdalen Spatz, NP 03/30/2017  4:41 PM

## 2017-03-30 NOTE — Telephone Encounter (Signed)
PCC's can we check on this order? I am sure it was sent over. Please follow up in the am. I spoke with pt, she states [facility] didn't call her. I advised her that we would check on this and call her tomorrow.    [phone]

## 2017-03-30 NOTE — Assessment & Plan Note (Addendum)
No significant change in exertional dyspnea with increase in prednisone to 7.5 mg Plan We will walk you today to make sure your oxygen levels are adequate. Your saturations dropped to 87% with exertion. We will order oxygen for you. Please wear oxygen at 2 L [place] with exertion.  You do not need to wear it while at rest. Continue your Zyrtec and Flonase as you have been doing. We will increase prednisone to 15 mg daily. Return in 1 month to see [name] [name] or Dr. Lake Bells  To make sure you are doing better.  If no significant improvement on increased in prednisone, we will determine additional diagnostic testing. Please contact office for sooner follow up if symptoms do not improve or worsen or seek emergency care

## 2017-03-30 NOTE — Assessment & Plan Note (Signed)
We will order oxygen to be worn at 2 L nasal cannula with exertion No need to wear at rest Goal is to maintain saturations greater than 90%

## 2017-03-30 NOTE — Patient Instructions (Addendum)
It is nice to see you today. I am so sorry you have had a stressful month. We will walk you today to make sure your oxygen levels are adequate. Your saturations dropped to 87% with exertion. We will order oxygen for you. Please wear oxygen at 2 L [place] with exertion.  You do not need to wear it while at rest. Continue your Zyrtec and Flonase as you have been doing. We will increase prednisone to 15 mg daily. Return in 1 month to see [name] Roch NP or Dr. Lake Bells  To make sure you are doing better.  Please contact office for sooner follow up if symptoms do not improve or worsen or seek emergency care

## 2017-03-31 NOTE — Telephone Encounter (Signed)
Spoke to [email or web address]  she does have the order and will call pt asap to get it set up pt is aware Joellen [place]

## 2017-04-03 NOTE — Progress Notes (Signed)
Reviewed, agree 

## 2017-04-10 ENCOUNTER — Telehealth: Payer: Self-pay | Admitting: Acute Care

## 2017-04-10 DIAGNOSIS — J8410 Pulmonary fibrosis, unspecified: Secondary | ICD-10-CM

## 2017-04-10 DIAGNOSIS — J9611 Chronic respiratory failure with hypoxia: Secondary | ICD-10-CM

## 2017-04-10 NOTE — Telephone Encounter (Signed)
Called pt, no VM. Will can try to call back later.

## 2017-04-10 NOTE — Telephone Encounter (Signed)
lmtcb x2 for pt. 

## 2017-04-10 NOTE — Telephone Encounter (Signed)
Patient is returning phone call.  °

## 2017-04-11 NOTE — Telephone Encounter (Signed)
OK by me 

## 2017-04-11 NOTE — Telephone Encounter (Signed)
Pt is requesting that we place an order to [facility] for an [place] POC.  Pt wears 2lpm with exertion.    BQ ok to order?  Thanks!

## 2017-04-12 NOTE — Telephone Encounter (Signed)
Order placed. Nothing further needed. 

## 2017-05-01 ENCOUNTER — Encounter: Payer: Self-pay | Admitting: Acute Care

## 2017-05-01 ENCOUNTER — Ambulatory Visit (INDEPENDENT_AMBULATORY_CARE_PROVIDER_SITE_OTHER): Payer: Medicare Other | Admitting: Acute Care

## 2017-05-01 DIAGNOSIS — J8410 Pulmonary fibrosis, unspecified: Secondary | ICD-10-CM | POA: Diagnosis not present

## 2017-05-01 DIAGNOSIS — I2510 Atherosclerotic heart disease of native coronary artery without angina pectoris: Secondary | ICD-10-CM | POA: Diagnosis not present

## 2017-05-01 MED ORDER — OMEPRAZOLE 20 MG PO CPDR: 20.0000 mg | DELAYED_RELEASE_CAPSULE | Freq: Every day | ORAL | 11 refills | Status: DC

## 2017-05-01 NOTE — Patient Instructions (Addendum)
It is nice to see you today We will decrease your prednisone to 10 mg daily. Call if you find that your shortness of breath is worsening. Add Zyrtec once daily for post nasal drip Continue wearing your oxygen with exertion  to maintain oxygen saturations at 92-94% You can take oxygen off at rest as long as oxygen saturations are > 88% Prilosec 20 mg  Daily x 1 month to see if you have improvement in your reflux symptoms. Copy of reflux diet. Flu shot today ( High Dose) Follow up in 4 weeks with Dr.McQuaid or [name] NP to assess how you are doing on lower dose prednisone. Please contact office for sooner follow up if symptoms do not improve or worsen or seek emergency care

## 2017-05-01 NOTE — Assessment & Plan Note (Signed)
We will decrease your prednisone to 10 mg daily. Call if you find that your shortness of breath is worsening. Prilosec 20 mg  Daily x 1 month to see if you have improvement in your reflux symptoms. Copy of reflux diet. Flu shot today ( High Dose) Follow up in 4 weeks with Dr.McQuaid or [name] NP to assess how you are doing on lower dose prednisone. Please contact office for sooner follow up if symptoms do not improve or worsen or seek emergency care

## 2017-05-01 NOTE — Progress Notes (Signed)
History of Present Illness [name] [name] is a 83 y.o. female with with Rheumatoid Arthritis Associated with ILD pattern consistent with UIP. She is followed by [name] [name].   Synopsis: This is a very pleasant female who is diagnosed with interstitial lung disease in her mid 28s after evaluation at [facility] and [facility]. She was found to have a UIP pattern on CT scan and serology testing revealed a very high rheumatoid factor as well as anti-CCP antibody. She was followed by [name] [name] at [facility] who felt like she had rheumatoid arthritis associated interstitial lung disease. He never treated her with immunosuppressive therapy. From 2000 07/09/2013 she's continued to follow in both [place] and due to her lung function has remained stable during that time. She has not developed new shortness of breath. She has had repeated episodes of pulmonary emboli. The most recent was in the summer of 2014 when she developed her fourth pulmonary embolism after returning from a trip to [place].  05/01/2017 Follow up after initiation of oxygen therapy in September, and increase of her prednisone to 15 mg daily. She states she is doing well . She is compliant with her prednisone and her home oxygen. She states she has noted less dyspnea, and she can walk to her mail box without having to sit down and rest.She is managing her oxygen at rest well. She has noted that her appetite is better on the prednisone.She has been doing research on IPF. She has noted worsening reflux.She states she is not on any PPI. She states she has worsening allergies. She is not taking her Zyrtec.She denies fever, chest pain, orthopnea or hemoptysis.   Test Results:  Test Results: Ambulatory Saturation Test:03/30/2017  Patient Saturations on Room Air at Rest = 95%  Patient Saturations on Room Air while Ambulating = 87%  Patient Saturations on 2 Liters of oxygen while Ambulating =  96%  Please briefly explain why patient needs home oxygen:  2-D echo>>02/03/2017:EF 58-52% grade 1 diastolic dysfunction  VQ DPOE:42/35/3614 IMPRESSION: Indeterminate scan for pulmonary embolus. Exam is limited due to severe chronic interstitial lung disease/COPD present.  PFT July 2017 pulmonary function testing FVC 1.81 L 90% predicted, total lung capacity 3.79 L 72% predicted, DLCO 8.29 32% predicted. July 2018: Ratio 85%, FVC 1.88 L, 95% predicted, total lung capacity 3.37 L 64% predicted, DLCO 7.14 27 percent predicted  6MW July 2017 6 minute walk distance 324 m, O2 saturation nadir 93% on room air    CBC Latest Ref Rng & Units 01/30/2017 10/24/2016 03/17/2015  WBC 4.0 - 10.5 K/uL 10.3 9.4 10.4  Hemoglobin 12.0 - 15.0 g/dL 14.3 13.6 14.5  Hematocrit 36.0 - 46.0 % 42.8 40.2 43.8  Platelets 150.0 - 400.0 K/uL 198.0 203.0 221.0    BMP Latest Ref Rng & Units 10/24/2016 08/05/2016 11/30/2015  Glucose 70 - 99 mg/dL 105(H) 106(H) 93  BUN 6 - 23 mg/dL 13 14 6(L)  Creatinine 0.40 - 1.20 mg/dL 0.75 0.78 0.74  Sodium 135 - 145 mEq/L 140 140 139  Potassium 3.5 - 5.1 mEq/L 4.0 4.3 4.5  Chloride 96 - 112 mEq/L 106 105 103  CO2 19 - 32 mEq/L 28 30 26   Calcium 8.4 - 10.5 mg/dL 9.4 9.4 9.6    BNP No results found for: BNP  ProBNP    Component Value Date/Time   PROBNP 20.0 01/30/2017 1245    PFT    Component Value Date/Time   FEV1PRE 1.50  01/30/2017 1104   FEV1POST 1.61 01/30/2017 1104   FVCPRE 1.89 01/30/2017 1104   FVCPOST 1.88 01/30/2017 1104   TLC 3.37 01/30/2017 1104   DLCOUNC 7.14 01/30/2017 1104   PREFEV1FVCRT 79 01/30/2017 1104   PSTFEV1FVCRT 85 01/30/2017 1104    No results found.   Past medical hx Past Medical History:  Diagnosis Date  . Allergic rhinitis   . Allergy   . CAD (coronary artery disease)    Mild CAD by cath 2008  . DJD (degenerative joint disease)    rheumatoid  . DVT (deep venous thrombosis) ([place])   . Dyslipidemia   . History of  nuclear stress test    Myoview 5/17:  EF 74%, normal perfusion, low risk  . History of pulmonary embolism   . Hyperlipidemia   . Insomnia   . Osteoporosis   . Pulmonary fibrosis ([place])   . Rheumatoid arthritis [facility])      Social History  Substance Use Topics  . Smoking status: Former Smoker    Packs/day: 0.30    Years: 20.00    Types: Cigarettes    Quit date: 07/11/1973  . Smokeless tobacco: Never Used  . Alcohol use No    [name] [name] reports that she quit smoking about 43 years ago. Her smoking use included Cigarettes. She has a 6.00 pack-year smoking history. She has never used smokeless tobacco. She reports that she does not drink alcohol or use drugs.  Tobacco Cessation: Former smoker quit 1974 with a 6 pack year smoking history.  Past surgical hx, Family hx, Social hx all reviewed.  Current Outpatient Prescriptions on File Prior to Visit  Medication Sig  . ALPRAZolam (XANAX) 0.25 MG tablet Take 1 tablet (0.25 mg total) by mouth at bedtime as needed for anxiety.  [name] aspirin EC 81 MG tablet Take 1 tablet (81 mg total) by mouth daily.  [name] atorvastatin (LIPITOR) 10 MG tablet Take 1 tablet (10 mg total) by mouth daily.  . cetirizine (ZYRTEC) 10 MG tablet Take 5-10 mg by mouth daily as needed for allergies.  . Cholecalciferol (VITAMIN D3) 2000 UNITS capsule Take 1 capsule (2,000 Units total) by mouth daily.  [name] enoxaparin (LOVENOX) 30 MG/0.3ML injection Inject 0.3 mLs (30 mg total) into the skin every 12 (twelve) hours as needed (travel only).  . fluticasone (FLONASE) 50 MCG/ACT nasal spray Place 2 sprays into both nostrils daily as needed for allergies or rhinitis.  . furosemide (LASIX) 20 MG tablet Take one tablet by mouth daily as needed for swelling  . InFLIXimab (REMICADE IV) Inject into the vein.  [name] latanoprost (XALATAN) 0.005 % ophthalmic solution Place 1 drop into both eyes at bedtime.   . mupirocin ointment (BACTROBAN) 2 % Apply to affected area TID for 7 days.  [name] [name]  Glycol-Propyl Glycol (SYSTANE) 0.4-0.3 % SOLN Apply 1 drop to eye daily as needed (for dry eyes).  . predniSONE (DELTASONE) 10 MG tablet Take 1.5 tablets (15 mg total) by mouth daily with breakfast.   No current facility-administered medications on file prior to visit.      Allergies  Allergen Reactions  . Crestor [Rosuvastatin] Other (See Comments)    Muscle pain  . Lactose Intolerance (Gi) Other (See Comments)    Stomach upset  . Penicillins Hives  . Niacin Hives    Review Of Systems:  Constitutional:   No  weight loss, night sweats,  Fevers, chills, fatigue, or  lassitude.  HEENT:   No headaches,  Difficulty swallowing,  Tooth/dental problems,  or  Sore throat,                No sneezing, itching, ear ache, nasal congestion, +post nasal drip,   CV:  No chest pain,  Orthopnea, PND, swelling in lower extremities, anasarca, dizziness, palpitations, syncope.   GI  No heartburn, indigestion, abdominal pain, nausea, vomiting, diarrhea, change in bowel habits, loss of appetite, bloody stools.   Resp: + shortness of breath with exertion not  at rest.  No excess mucus, no productive cough,  No non-productive cough,  No coughing up of blood.  No change in color of mucus.  No wheezing.  No chest wall deformity  Skin: no rash or lesions.  GU: no dysuria, change in color of urine, no urgency or frequency.  No flank pain, no hematuria   MS:  No joint pain or swelling.  No decreased range of motion.  No back pain.  Psych:  No change in mood or affect. No depression or anxiety.  No memory loss.   Vital Signs BP 118/76 (BP Location: Left Arm, Cuff Size: Normal)   Pulse 92   Ht 5' 4.5" (1.638 m)   Wt 140 lb 4 oz (63.6 kg)   SpO2 94%   BMI 23.70 kg/m    Physical Exam:  General- No distress,  A&Ox3, pleasant ENT: No sinus tenderness, TM clear, pale nasal mucosa, no oral exudate,+ post nasal drip, no LAN Cardiac: S1, S2, regular rate and rhythm, no murmur Chest: No wheeze/ +  crackles, / dullness; no accessory muscle use, no nasal flaring, no sternal retractions Abd.: Soft Non-tender, non-distended Ext: No clubbing cyanosis, edema Neuro:  Deconditioned at baseline, A&O x 3, MAE x 4 Skin: No rashes, warm and dry Psych: normal mood and behavior   Assessment/Plan  Postinflammatory pulmonary fibrosis (HCC) We will decrease your prednisone to 10 mg daily. Call if you find that your shortness of breath is worsening. Prilosec 20 mg  Daily x 1 month to see if you have improvement in your reflux symptoms. Copy of reflux diet. Flu shot today ( High Dose) Follow up in 4 weeks with KatherineMcQuaid or Gavriella Hearst NP to assess how you are doing on lower dose prednisone. Please contact office for sooner follow up if symptoms do not improve or worsen or seek emergency care    Chronic respiratory failure with hypoxia ([place]) Doing well on oxygen at rest Plan: Continue wearing your oxygen with exertion  to maintain oxygen saturations at 92-94% You can take oxygen off at rest as long as oxygen saturations are > 88% Please contact office for sooner follow up if symptoms do not improve or worsen or seek emergency care      Magdalen Spatz, NP 05/01/2017  11:53 AM

## 2017-05-01 NOTE — Assessment & Plan Note (Signed)
Doing well on oxygen at rest Plan: Continue wearing your oxygen with exertion  to maintain oxygen saturations at 92-94% You can take oxygen off at rest as long as oxygen saturations are > 88% Please contact office for sooner follow up if symptoms do not improve or worsen or seek emergency care

## 2017-05-03 NOTE — Progress Notes (Signed)
Reviewed, continue current management

## 2017-05-05 DIAGNOSIS — J849 Interstitial pulmonary disease, unspecified: Secondary | ICD-10-CM | POA: Diagnosis not present

## 2017-05-05 DIAGNOSIS — Z6823 Body mass index (BMI) 23.0-23.9, adult: Secondary | ICD-10-CM | POA: Diagnosis not present

## 2017-05-05 DIAGNOSIS — L089 Local infection of the skin and subcutaneous tissue, unspecified: Secondary | ICD-10-CM | POA: Diagnosis not present

## 2017-05-05 DIAGNOSIS — M2550 Pain in unspecified joint: Secondary | ICD-10-CM | POA: Diagnosis not present

## 2017-05-05 DIAGNOSIS — Z79899 Other long term (current) drug therapy: Secondary | ICD-10-CM | POA: Diagnosis not present

## 2017-05-05 DIAGNOSIS — M0579 Rheumatoid arthritis with rheumatoid factor of multiple sites without organ or systems involvement: Secondary | ICD-10-CM | POA: Diagnosis not present

## 2017-05-10 DIAGNOSIS — M0579 Rheumatoid arthritis with rheumatoid factor of multiple sites without organ or systems involvement: Secondary | ICD-10-CM | POA: Diagnosis not present

## 2017-05-24 ENCOUNTER — Encounter: Payer: Self-pay | Admitting: Nurse Practitioner

## 2017-05-24 ENCOUNTER — Ambulatory Visit (INDEPENDENT_AMBULATORY_CARE_PROVIDER_SITE_OTHER): Payer: Medicare Other | Admitting: Nurse Practitioner

## 2017-05-24 VITALS — BP 100/66 | HR 94 | Temp 97.9°F | Ht 64.5 in | Wt 139.0 lb

## 2017-05-24 DIAGNOSIS — R079 Chest pain, unspecified: Secondary | ICD-10-CM | POA: Diagnosis not present

## 2017-05-24 DIAGNOSIS — N898 Other specified noninflammatory disorders of vagina: Secondary | ICD-10-CM

## 2017-05-24 DIAGNOSIS — K130 Diseases of lips: Secondary | ICD-10-CM | POA: Diagnosis not present

## 2017-05-24 MED ORDER — FLUCONAZOLE 150 MG PO TABS: 150.0000 mg | ORAL_TABLET | Freq: Once | ORAL | 0 refills | Status: AC

## 2017-05-24 MED ORDER — NYSTATIN 100000 UNIT/GM EX POWD: Freq: Three times a day (TID) | CUTANEOUS | 0 refills | Status: DC

## 2017-05-24 NOTE — Progress Notes (Signed)
Subjective:  Patient ID: [name] [name], female    DOB: 12-29-1930  Age: 83 y.o. MRN: [id number]  CC: Vaginitis (itching,); Heartburn (acide reflux? complain in chest area); Lip Laceration (corners of mouth inflam,red,painful?); and Finger Injury (finger nail seem dead?)   Chest Pain   This is a recurrent problem. The current episode started more than 1 month ago. The onset quality is gradual. The problem occurs intermittently. The problem has been waxing and waning. The pain is present in the substernal region. The quality of the pain is described as pressure, tightness and burning. The pain does not radiate. Associated symptoms include exertional chest pressure. Pertinent negatives include no abdominal pain, back pain, cough, dizziness, fever, irregular heartbeat, malaise/fatigue, nausea, numbness, orthopnea, palpitations, PND, shortness of breath, syncope, vomiting or weakness. She has tried rest for the symptoms. The treatment provided moderate relief. Risk factors include post-menopausal, sedentary lifestyle and being elderly.    vaginal itching: No improvement monistat Has rash, no discharge. She declined vaginal exam  Ulcer on corner of mouth improves with oitment, but returns. Sometimes sleeps with dentures.  Finger nail coming off, needs trimming. Unable to remember if she injured nail or not.  Chest pain: Onset x 81months. Intermittent. Worse with lifting and exertion No improvement with omeprazole.   Outpatient Medications Prior to Visit  Medication Sig Dispense Refill  . ALPRAZolam (XANAX) 0.25 MG tablet Take 1 tablet (0.25 mg total) by mouth at bedtime as needed for anxiety. 15 tablet 0  . aspirin EC 81 MG tablet Take 1 tablet (81 mg total) by mouth daily.    [name] atorvastatin (LIPITOR) 10 MG tablet Take 1 tablet (10 mg total) by mouth daily. 90 tablet 3  . cetirizine (ZYRTEC) 10 MG tablet Take 5-10 mg by mouth daily as needed for allergies.    . Cholecalciferol (VITAMIN  D3) 2000 UNITS capsule Take 1 capsule (2,000 Units total) by mouth daily. 100 capsule 3  . enoxaparin (LOVENOX) 30 MG/0.3ML injection Inject 0.3 mLs (30 mg total) into the skin every 12 (twelve) hours as needed (travel only). 10 Syringe 0  . fluticasone (FLONASE) 50 MCG/ACT nasal spray Place 2 sprays into both nostrils daily as needed for allergies or rhinitis. 16 g 3  . furosemide (LASIX) 20 MG tablet Take one tablet by mouth daily as needed for swelling 15 tablet 6  . InFLIXimab (REMICADE IV) Inject into the vein.    [name] latanoprost (XALATAN) 0.005 % ophthalmic solution Place 1 drop into both eyes at bedtime.   0  . mupirocin ointment (BACTROBAN) 2 % Apply to affected area TID for 7 days. 30 g 3  . omeprazole (PRILOSEC) 20 MG capsule Take 1 capsule (20 mg total) by mouth daily. 30 capsule 11  . Polyethyl Glycol-Propyl Glycol (SYSTANE) 0.4-0.3 % SOLN Apply 1 drop to eye daily as needed (for dry eyes).    . predniSONE (DELTASONE) 10 MG tablet Take 1.5 tablets (15 mg total) by mouth daily with breakfast. 45 tablet 0  . trimethoprim (TRIMPEX) 100 MG tablet Take 100 mg by mouth daily.     No facility-administered medications prior to visit.     ROS See HPI  Objective:  BP 100/66   Pulse 94   Temp 97.9 F (36.6 C) (Oral)   Ht 5' 4.5" (1.638 m)   Wt 139 lb (63 kg)   SpO2 98%   BMI 23.49 kg/m   BP Readings from Last 3 Encounters:  05/24/17 100/66  05/01/17 118/76  03/30/17 104/62    Wt Readings from Last 3 Encounters:  05/24/17 139 lb (63 kg)  05/01/17 140 lb 4 oz (63.6 kg)  03/30/17 136 lb 12.8 oz (62.1 kg)    Physical Exam  Constitutional: She is oriented to person, place, and time. No distress.  Cardiovascular: Normal rate and regular rhythm.  Pulmonary/Chest: Effort normal and breath sounds normal. She exhibits tenderness and bony tenderness.    Midline anterior chest wall tenderness  Genitourinary:  Genitourinary Comments: She declined vaginal exam  Neurological: She is  alert and oriented to person, place, and time.  Skin: Skin is warm and dry. Rash noted. Rash is macular. No erythema.  Angular fissure on bilateral borders of mouth, minimal erythema.  Psychiatric: She has a normal mood and affect. Her behavior is normal.  Vitals reviewed.  Lab Results  Component Value Date   WBC 10.3 01/30/2017   HGB 14.3 01/30/2017   HCT 42.8 01/30/2017   PLT 198.0 01/30/2017   GLUCOSE 105 (H) 10/24/2016   CHOL 177 12/23/2016   TRIG 96 12/23/2016   HDL 70 12/23/2016   LDLCALC 88 12/23/2016   ALT 18 10/24/2016   AST 21 10/24/2016   NA 140 10/24/2016   K 4.0 10/24/2016   CL 106 10/24/2016   CREATININE 0.75 10/24/2016   BUN 13 10/24/2016   CO2 28 10/24/2016   TSH 1.83 10/24/2016   INR 2.2 01/28/2014    Dg Chest 2 View  Result Date: 02/02/2017 CLINICAL DATA:  Shortness of breath. History of DVT. Pulmonary fibrosis . EXAM: CHEST  2 VIEW COMPARISON:  CT 08/08/2016 .  Chest x-ray 08/05/2016, 02/23/2016 . FINDINGS: Heart size normal. Diffuse bilateral interstitial prominence consistent with chronic interstitial lung disease. Bilateral pleural thickening noted consistent scarring . No significant change from prior exam. COPD. Heart size normal . IMPRESSION: Findings consistent with severe chronic interstitial lung disease and bilateral pleural scarring. COPD. No significant change from prior exam . Electronically Signed   By: Marcello Moores  Register   On: 02/02/2017 11:21   Nm Pulmonary Per & Vent  Result Date: 02/02/2017 CLINICAL DATA:  History of DVT. EXAM: NUCLEAR MEDICINE VENTILATION - PERFUSION LUNG SCAN TECHNIQUE: Ventilation images were obtained in multiple projections using inhaled aerosol Tc-35m DTPA. Perfusion images were obtained in multiple projections after intravenous injection of Tc-67m MAA. RADIOPHARMACEUTICALS:  32.5 mCi Technetium-41m DTPA aerosol inhalation and 4.1 mCi Technetium-47m MAA IV COMPARISON:  Chest x-ray 02/02/2017, 12/01/2015. FINDINGS: Poor  ventilation bilaterally. Small perfusion defects noted bilaterally. This scan is indeterminate for pulmonary embolus. Changes present may be related to severe chronic interstitial lung disease/COPD present. IMPRESSION: Indeterminate scan for pulmonary embolus. Exam is limited due to severe chronic interstitial lung disease/COPD present. Electronically Signed   By: Marcello Moores  Register   On: 02/02/2017 11:20    Assessment & Plan:   Cyd was seen today for vaginitis, heartburn, lip laceration and finger injury.  Diagnoses and all orders for this visit:  Itching in the vaginal area -     nystatin (MYCOSTATIN/NYSTOP) powder; Apply 3 (three) times daily topically. -     fluconazole (DIFLUCAN) 150 MG tablet; Take 1 tablet (150 mg total) once for 1 dose by mouth.  Angular cheilitis  Chest pain, unspecified type -     Ambulatory referral to Cardiology   I am having [name] [name]. [name] [name] start on nystatin and fluconazole. I am also having her maintain her cetirizine, Polyethyl Glycol-Propyl Glycol, latanoprost, fluticasone, Vitamin D3, aspirin EC, furosemide, InFLIXimab (REMICADE IV),  ALPRAZolam, enoxaparin, atorvastatin, mupirocin ointment, predniSONE, trimethoprim, and omeprazole.  Meds ordered this encounter  Medications  . nystatin (MYCOSTATIN/NYSTOP) powder    Sig: Apply 3 (three) times daily topically.    Dispense:  60 g    Refill:  0    Order Specific Question:   Supervising Provider    Answer:   Cassandria Anger [[id number]]  . fluconazole (DIFLUCAN) 150 MG tablet    Sig: Take 1 tablet (150 mg total) once for 1 dose by mouth.    Dispense:  1 tablet    Refill:  0    Order Specific Question:   Supervising Provider    Answer:   Cassandria Anger [[id number]]    Follow-up: No Follow-up on file.  Wilfred Lacy, NP

## 2017-05-24 NOTE — Patient Instructions (Addendum)
You will be contacted to schedule appt with cardiology.   Take tylenol for chest wall pain. Use cold compress 2-3times a day as needed  Continue use of bactroban ointment to mouth ulcers as prescribed. Advised patient about importance of proper mouth and denture care. Also advised not to sleep with dentures.   Chest Wall Pain Chest wall pain is pain in or around the bones and muscles of your chest. Sometimes, an injury causes this pain. Sometimes, the cause may not be known. This pain may take several weeks or longer to get better. Follow these instructions at home: Pay attention to any changes in your symptoms. Take these actions to help with your pain:  Rest as told by your doctor.  Avoid activities that cause pain. Try not to use your chest, belly (abdominal), or side muscles to lift heavy things.  If directed, apply ice to the painful area: ? Put ice in a plastic bag. ? Place a towel between your skin and the bag. ? Leave the ice on for 20 minutes, 2-3 times per day.  Take over-the-counter and prescription medicines only as told by your doctor.  Do not use tobacco products, including cigarettes, chewing tobacco, and e-cigarettes. If you need help quitting, ask your doctor.  Keep all follow-up visits as told by your doctor. This is important.  Contact a doctor if:  You have a fever.  Your chest pain gets worse.  You have new symptoms. Get help right away if:  You feel sick to your stomach (nauseous) or you throw up (vomit).  You feel sweaty or light-headed.  You have a cough with phlegm (sputum) or you cough up blood.  You are short of breath. This information is not intended to replace advice given to you by your health care provider. Make sure you discuss any questions you have with your health care provider. Document Released: 12/14/2007 Document Revised: 12/03/2015 Document Reviewed: 09/22/2014 Elsevier Interactive Patient Education  [employer].

## 2017-05-25 ENCOUNTER — Encounter: Payer: Self-pay | Admitting: Nurse Practitioner

## 2017-05-29 ENCOUNTER — Ambulatory Visit (INDEPENDENT_AMBULATORY_CARE_PROVIDER_SITE_OTHER): Payer: Medicare Other | Admitting: Physician Assistant

## 2017-05-29 ENCOUNTER — Encounter: Payer: Self-pay | Admitting: Physician Assistant

## 2017-05-29 VITALS — BP 102/66 | HR 97 | Ht 64.0 in | Wt 138.0 lb

## 2017-05-29 DIAGNOSIS — E785 Hyperlipidemia, unspecified: Secondary | ICD-10-CM | POA: Diagnosis not present

## 2017-05-29 DIAGNOSIS — I25119 Atherosclerotic heart disease of native coronary artery with unspecified angina pectoris: Secondary | ICD-10-CM | POA: Diagnosis not present

## 2017-05-29 DIAGNOSIS — I209 Angina pectoris, unspecified: Secondary | ICD-10-CM

## 2017-05-29 DIAGNOSIS — Z86718 Personal history of other venous thrombosis and embolism: Secondary | ICD-10-CM | POA: Diagnosis not present

## 2017-05-29 MED ORDER — ISOSORBIDE MONONITRATE ER 30 MG PO TB24: 15.0000 mg | ORAL_TABLET | Freq: Every day | ORAL | 3 refills | Status: DC

## 2017-05-29 NOTE — Patient Instructions (Signed)
Medication Instructions:  Your physician has recommended you make the following change in your medication:  1.  START Imdur 30 mg taking 1/2 tablet daily  Labwork: None ordered  Testing/Procedures: Your physician has requested that you have a lexiscan myoview. For further information please visit [email or web address]. Please follow instruction sheet, as given.    Follow-Up: Your physician recommends that you schedule a follow-up appointment in:   [place]    Any Other Special Instructions Will Be Listed Below (If Applicable).  Regadenoson injection What is this medicine? REGADENOSON is used to test the heart for coronary artery disease. It is used in patients who can not exercise for their stress test. This medicine may be used for other purposes; ask your health care provider or pharmacist if you have questions. COMMON BRAND NAME(S): Lexiscan What should I tell my health care provider before I take this medicine? They need to know if you have any of these conditions: -heart problems -lung or breathing disease, like asthma or COPD -an unusual or allergic reaction to regadenoson, other medicines, foods, dyes, or preservatives -pregnant or trying to get pregnant -breast-feeding How should I use this medicine? This medicine is for injection into a vein. It is given by a health care professional in a hospital or clinic setting. Talk to your pediatrician regarding the use of this medicine in children. Special care may be needed. Overdosage: If you think you have taken too much of this medicine contact a poison control center or emergency room at once. NOTE: This medicine is only for you. Do not share this medicine with others. What if I miss a dose? This does not apply. What may interact with this medicine? -caffeine -dipyridamole -guarana -theophylline This list may not describe all possible interactions. Give your health care provider a list of all the  medicines, herbs, non-prescription drugs, or dietary supplements you use. Also tell them if you smoke, drink alcohol, or use illegal drugs. Some items may interact with your medicine. What should I watch for while using this medicine? Your condition will be monitored carefully while you are receiving this medicine. Do not take medicines, foods, or drinks with caffeine (like coffee, tea, or colas) for at least 12 hours before your test. If you do not know if something contains caffeine, ask your health care professional. What side effects may I notice from receiving this medicine? Side effects that you should report to your doctor or health care professional as soon as possible: -allergic reactions like skin rash, itching or hives, swelling of the face, lips, or tongue -breathing problems -chest pain, tightness or palpitations -severe headache Side effects that usually do not require medical attention (report to your doctor or health care professional if they continue or are bothersome): -flushing -headache -irritation or pain at site where injected -nausea, vomiting This list may not describe all possible side effects. Call your doctor for medical advice about side effects. You may report side effects to FDA at 1-800-FDA-1088. Where should I keep my medicine? This drug is given in a hospital or clinic and will not be stored at home. NOTE: This sheet is a summary. It may not cover all possible information. If you have questions about this medicine, talk to your doctor, pharmacist, or health care provider.  2018 Elsevier/Gold Standard (2008-02-25 15:08:13)    If you need a refill on your cardiac medications before your next appointment, please call your pharmacy.

## 2017-05-29 NOTE — Progress Notes (Signed)
Cardiology Office Note    Date:  05/29/2017   ID:  [name], [name] 15-Dec-1930, MRN [id number]  PCP:  Hoyt Koch, [name] [name]  Cardiologist: Dr. Angelena Form  Chief Complaint  Patient presents with  . Follow-up    History of Present Illness:   [name] [name] is a 83 y.o. female who is being seen today for the evaluation of chest pain at the request of Nche, Charlene Brooke, NP.  Patient has history of CAD last cath 2014 30% mid LAD, 30% mid circumflex, 20% RCA.  She has not tolerated statins in the past.  Nuclear stress test May/2017 no ischemia.  Chest CTA without acute PE.  Has history of recurrent DVT/PE(last one 2014 after returning from trip to [place]), HLD, pulmonary fibrosis-started on oxygen in September.  Last saw Dr. Angelena Form 09/09/16 and doing well without chest pain.  Given Lasix 20 mg as needed for dependent lower extremity edema.  Profile 12/2016 LDL down to 88 total cholesterol 177 HDL 70.  2D echo 01/2017 normal LVEF 60-65% with grade 1 DD.  VQ scan indeterminate for PE 02/02/17.  Patient comes in today and complains of several month history of exertional chest tightness.  At first she thought it was GI and she tried Prilosec but it did not help.  She really is not having indigestion symptoms.  She is on oxygen now for her pulmonary fibrosis and being followed closely by pulmonary.  Past Medical History:  Diagnosis Date  . Allergic rhinitis   . Allergy   . CAD (coronary artery disease)    Mild CAD by cath 2008  . DJD (degenerative joint disease)    rheumatoid  . DVT (deep venous thrombosis) ([place])   . Dyslipidemia   . History of nuclear stress test    Myoview 5/17:  EF 74%, normal perfusion, low risk  . History of pulmonary embolism   . Hyperlipidemia   . Insomnia   . Osteoporosis   . Pulmonary fibrosis ([place])   . Rheumatoid arthritis [facility])     Past Surgical History:  Procedure Laterality Date  . ABDOMINAL HYSTERECTOMY  1974  . APPENDECTOMY  1959  . CATARACT  EXTRACTION    . LEFT HEART CATHETERIZATION WITH CORONARY ANGIOGRAM N/A 06/18/2013   Performed by [place], Peter M, [name] [name] at [facility] CATH LAB  . TONSILLECTOMY  1941, 1951  . TOTAL KNEE ARTHROPLASTY  1997, 2007   Bilateral    Current Medications: Current Meds  Medication Sig  . ALPRAZolam (XANAX) 0.25 MG tablet Take 1 tablet (0.25 mg total) by mouth at bedtime as needed for anxiety.  [name] aspirin EC 81 MG tablet Take 1 tablet (81 mg total) by mouth daily.  [name] atorvastatin (LIPITOR) 10 MG tablet Take 1 tablet (10 mg total) by mouth daily.  . cetirizine (ZYRTEC) 10 MG tablet Take 5-10 mg by mouth daily as needed for allergies.  . Cholecalciferol (VITAMIN D3) 2000 UNITS capsule Take 1 capsule (2,000 Units total) by mouth daily.  [name] enoxaparin (LOVENOX) 30 MG/0.3ML injection Inject 0.3 mLs (30 mg total) into the skin every 12 (twelve) hours as needed (travel only).  . fluticasone (FLONASE) 50 MCG/ACT nasal spray Place 2 sprays into both nostrils daily as needed for allergies or rhinitis.  . furosemide (LASIX) 20 MG tablet Take one tablet by mouth daily as needed for swelling  . InFLIXimab (REMICADE IV) Inject into the vein.  [name] latanoprost (XALATAN) 0.005 % ophthalmic solution Place 1 drop into both eyes at  bedtime.   . mupirocin ointment (BACTROBAN) 2 % Apply to affected area TID for 7 days.  [name] nystatin (MYCOSTATIN/NYSTOP) powder Apply 3 (three) times daily topically.  [name] omeprazole (PRILOSEC) 20 MG capsule Take 1 capsule (20 mg total) by mouth daily.  [name] Glycol-Propyl Glycol (SYSTANE) 0.4-0.3 % SOLN Apply 1 drop to eye daily as needed (for dry eyes).  . predniSONE (DELTASONE) 10 MG tablet Take 1.5 tablets (15 mg total) by mouth daily with breakfast.  . trimethoprim (TRIMPEX) 100 MG tablet Take 100 mg by mouth daily.     Allergies:   Crestor [rosuvastatin]; Lactose intolerance (gi); Penicillins; and Niacin   Social History   Socioeconomic History  . Marital status: Widowed    Spouse name: None    . Number of children: 2  . Years of education: None  . Highest education level: None  Social Needs  . Financial resource strain: None  . Food insecurity - worry: None  . Food insecurity - inability: None  . Transportation needs - medical: None  . Transportation needs - non-medical: None  Occupational History  . Occupation: Publishing rights manager    Comment: retired  Tobacco Use  . Smoking status: Former Smoker    Packs/day: 0.30    Years: 20.00    Pack years: 6.00    Types: Cigarettes    Last attempt to quit: 07/11/1973    Years since quitting: 43.9  . Smokeless tobacco: Never Used  Substance and Sexual Activity  . Alcohol use: No    Alcohol/week: 0.0 oz  . Drug use: No  . Sexual activity: No  Other Topics Concern  . None  Social History Narrative   Diet:   Do you drink/eat things with caffeine? Yes   Marital status:        Widowed                      What year were you married?1954   Do you live in a house, apartment, assisted living, condo, trailer, etc)? House   Is it one or more stories? 1 1/4   How many persons live in your home?1   Do you have any pets in your home? No   Current or past profession:  Publishing rights manager   Do you exercise?        Yes                                            Type & how often:  Walk                                    Do you have a living will? Yes   Do you have a DNR Form? Yes   Do you have a POA/HPOA forms?  Yes     Family History:  The patient's family history includes Alzheimer's disease in her sister and sister; Cancer in her daughter; Heart attack (age of onset: 66) in her father; Heart disease in her sister; Kidney disease (age of onset: 58) in her daughter.   ROS:   Please see the history of present illness.    Review of Systems  Constitution: Negative.  HENT: Negative.   Eyes: Negative.   Cardiovascular: Positive for chest pain, dyspnea on exertion  and leg swelling.  Respiratory: Positive for shortness  of breath and wheezing.   Hematologic/Lymphatic: Negative.   Musculoskeletal: Negative.  Negative for joint pain.  Gastrointestinal: Negative.   Genitourinary: Negative.   Neurological: Negative.    All other systems reviewed and are negative.   PHYSICAL EXAM:   VS:  BP 102/66   Pulse 97   Ht 5\' 4"  (1.626 m)   Wt 138 lb (62.6 kg)   BMI 23.69 kg/m   Physical Exam  GEN: Well nourished, well developed, in no acute distress  Neck: no JVD, carotid bruits, or masses Cardiac:RRR; no murmurs, rubs, or gallops  Respiratory:  decreased breath sounds with fine crackles throughout Der, nondistended, + BS Ext: Trace of ankle edema without cyanosis, clubbing,  Good distal pulses bilaterally Neuro:  Alert and Oriented x 3 Psych: euthymic mood, full affect  Wt Readings from Last 3 Encounters:  05/29/17 138 lb (62.6 kg)  05/24/17 139 lb (63 kg)  05/01/17 140 lb 4 oz (63.6 kg)      Studies/Labs Reviewed:   EKG:  EKG is ordered today.  The ekg ordered today demonstrates normal sinus rhythm normal EKG  Recent Labs: 10/24/2016: ALT 18; BUN 13; Creatinine, Ser 0.75; Magnesium 1.7; Potassium 4.0; Sodium 140; TSH 1.83 01/30/2017: Hemoglobin 14.3; Platelets 198.0; Pro B Natriuretic peptide (BNP) 20.0   Lipid Panel    Component Value Date/Time   CHOL 177 12/23/2016 0931   TRIG 96 12/23/2016 0931   HDL 70 12/23/2016 0931   CHOLHDL 2.5 12/23/2016 0931   CHOLHDL 3.1 10/13/2015 0945   VLDL 26 10/13/2015 0945   LDLCALC 88 12/23/2016 0931    Additional studies/ records that were reviewed today include:  2D echo 7/2018Study Conclusions   - Left ventricle: The cavity size was normal. Systolic function was   normal. The estimated ejection fraction was in the range of 60%   to 65%. Wall motion was normal; there were no regional wall   motion abnormalities. There was an increased relative   contribution of atrial contraction to ventricular filling.   Doppler parameters are consistent with  abnormal left ventricular   relaxation (grade 1 diastolic dysfunction). - Aortic valve: Trileaflet; normal thickness, mildly calcified   leaflets.   Nuclear stress test 5/2017Study Highlights    Nuclear stress EF: 74%.  There was no ST segment deviation noted during stress.  This is a low risk study. Normal perfusion, no ischemia identified.   [name] Furbish, [name] [name]     Cardiac cath 06/18/13: Left mainstem: Normal  Left anterior descending (LAD): Mild calcification. 30% disease in the mid vessel at the take off of the first diagonal.  Left circumflex (LCx): 30% disease in the mid vessel.  Right coronary artery (RCA): Mild calcification. Mild disease in the proximal and mid vessel to 20%.  Left ventriculography: Left ventricular systolic function is normal, LVEF is estimated at 55-65%, there is no significant mitral regurgitation     ASSESSMENT:    1. Coronary artery disease involving native coronary artery of native heart with angina pectoris ([place])   2. Dyslipidemia   3. Personal history of DVT (deep vein thrombosis)      PLAN:  In order of problems listed above:  CAD with mild disease on cath 06/2013, stress Myoview 11/2015 without ischemia.  On aspirin and statin.  Now having some exertional chest tightness.  We will add low-dose Imdur 15 mg daily because of borderline low blood pressure.  Schedule exercise Myoview and follow-up with  Dr. Angelena Form.  Hyperlipidemia continue Lipitor low dose  History of DVT/PE  Bilateral lower extremity edema on as needed Lasix 2D echo 01/2017 normal LVEF 60-65% with grade 1 DD.  Patient has not used her Lasix in a while.  She does have a trace of edema.  Recommend she take her Lasix as needed.    Medication Adjustments/Labs and Tests Ordered: Current medicines are reviewed at length with the patient today.  Concerns regarding medicines are outlined above.  Medication changes, Labs and Tests ordered today are listed in the Patient Instructions  below. Patient Instructions  Medication Instructions:  Your physician has recommended you make the following change in your medication:  1.  START Imdur 30 mg taking 1/2 tablet daily  Labwork: None ordered  Testing/Procedures: Your physician has requested that you have a lexiscan myoview. For further information please visit [email or web address]. Please follow instruction sheet, as given.    Follow-Up: Your physician recommends that you schedule a follow-up appointment in:   [place]    Any Other Special Instructions Will Be Listed Below (If Applicable).  Regadenoson injection What is this medicine? REGADENOSON is used to test the heart for coronary artery disease. It is used in patients who can not exercise for their stress test. This medicine may be used for other purposes; ask your health care provider or pharmacist if you have questions. COMMON BRAND NAME(S): Lexiscan What should I tell my health care provider before I take this medicine? They need to know if you have any of these conditions: -heart problems -lung or breathing disease, like asthma or COPD -an unusual or allergic reaction to regadenoson, other medicines, foods, dyes, or preservatives -pregnant or trying to get pregnant -breast-feeding How should I use this medicine? This medicine is for injection into a vein. It is given by a health care professional in a hospital or clinic setting. Talk to your pediatrician regarding the use of this medicine in children. Special care may be needed. Overdosage: If you think you have taken too much of this medicine contact a poison control center or emergency room at once. NOTE: This medicine is only for you. Do not share this medicine with others. What if I miss a dose? This does not apply. What may interact with this medicine? -caffeine -dipyridamole -guarana -theophylline This list may not describe all possible interactions. Give your health care  provider a list of all the medicines, herbs, non-prescription drugs, or dietary supplements you use. Also tell them if you smoke, drink alcohol, or use illegal drugs. Some items may interact with your medicine. What should I watch for while using this medicine? Your condition will be monitored carefully while you are receiving this medicine. Do not take medicines, foods, or drinks with caffeine (like coffee, tea, or colas) for at least 12 hours before your test. If you do not know if something contains caffeine, ask your health care professional. What side effects may I notice from receiving this medicine? Side effects that you should report to your doctor or health care professional as soon as possible: -allergic reactions like skin rash, itching or hives, swelling of the face, lips, or tongue -breathing problems -chest pain, tightness or palpitations -severe headache Side effects that usually do not require medical attention (report to your doctor or health care professional if they continue or are bothersome): -flushing -headache -irritation or pain at site where injected -nausea, vomiting This list may not describe all possible side effects. Call your doctor for  medical advice about side effects. You may report side effects to FDA at 1-800-FDA-1088. Where should I keep my medicine? This drug is given in a hospital or clinic and will not be stored at home. NOTE: This sheet is a summary. It may not cover all possible information. If you have questions about this medicine, talk to your doctor, pharmacist, or health care provider.  2018 Elsevier/Gold Standard (2008-02-25 15:08:13)    If you need a refill on your cardiac medications before your next appointment, please call your pharmacy.      Signed, Ermalinda Barrios, PA-C  05/29/2017 2:52 PM    [facility] [address], [place], [place]  [id number] Phone: [phone]; Fax: [phone]

## 2017-06-05 ENCOUNTER — Encounter: Payer: Self-pay | Admitting: Acute Care

## 2017-06-05 ENCOUNTER — Telehealth (HOSPITAL_COMMUNITY): Payer: Self-pay | Admitting: *Deleted

## 2017-06-05 ENCOUNTER — Ambulatory Visit (INDEPENDENT_AMBULATORY_CARE_PROVIDER_SITE_OTHER): Payer: Medicare Other | Admitting: Acute Care

## 2017-06-05 DIAGNOSIS — J8410 Pulmonary fibrosis, unspecified: Secondary | ICD-10-CM

## 2017-06-05 DIAGNOSIS — J9611 Chronic respiratory failure with hypoxia: Secondary | ICD-10-CM | POA: Diagnosis not present

## 2017-06-05 DIAGNOSIS — I25119 Atherosclerotic heart disease of native coronary artery with unspecified angina pectoris: Secondary | ICD-10-CM

## 2017-06-05 DIAGNOSIS — H40013 Open angle with borderline findings, low risk, bilateral: Secondary | ICD-10-CM | POA: Diagnosis not present

## 2017-06-05 DIAGNOSIS — H04123 Dry eye syndrome of bilateral lacrimal glands: Secondary | ICD-10-CM | POA: Diagnosis not present

## 2017-06-05 MED ORDER — LEVALBUTEROL TARTRATE 45 MCG/ACT IN AERO: 2.0000 | INHALATION_SPRAY | Freq: Four times a day (QID) | RESPIRATORY_TRACT | 12 refills | Status: DC | PRN

## 2017-06-05 NOTE — Progress Notes (Signed)
History of Present Illness [name] [name] is a 83 y.o. female with Rheumatoid Arthritis Associated with ILD pattern consistent with UIP. She is followed by Dr. Lake [name].   Synopsis: This is a very pleasant female who is diagnosed with interstitial lung disease in her mid 68s after evaluation at [facility] and [facility]. She was found to have a UIP pattern on CT scan and serology testing revealed a very high rheumatoid factor as well as anti-CCP antibody. She was followed by Dr. Elgie [name] at [facility] who felt like she had rheumatoid arthritis associated interstitial lung disease. He never treated her with immunosuppressive therapy. From 2000 07/09/2013 she's continued to follow in both [place] and due to her lung function has remained stable during that time. She has not developed new shortness of breath. She has had repeated episodes of pulmonary emboli. The most recent was in the summer of 2014 when she developed her fourth pulmonary embolism after returning from a trip to [place].  06/05/2017 4 week follow up: Pt. Presents for 4 week follow up after being seen 05/01/2017. She had recently been started on oxygen after that visit, and we decreased her prednisone to 10 mg daily from 15 mg. Additionally we resumed her PPI and Zyrtec as she noted worsening allergies and reflux.Of note she was recently seen by cardiology for some exertional chest pain. She was started on low dose Imdur ( 15 mg daily) , encouraged to resume her lasix as needed for weight gain/ edema, and she was scheduled for a  lexiscan myoview. Plan is for follow up with cardiology once scan has been completed.Pt states she has been doing well.  She continues to have dyspnea with exertion. She is compliant with her oxygen, prednisone and Zyrtec.She has been doing well on the lower dose of prednisone. She states she has had headache with her new use of Imdur that cards prescribed.. She states she  has noted she has some fluid in her feet at times.She has not taken the prn lasix that cardiology has asked her to take. She states she does not like to take too many medications. She is compliant with her oxygen. A radiologist at church suggested she start on Symbicort for wheezing with exertion. She denies fever, chest pain, orthopnea or hemoptysis.  Test Results: Ambulatory Saturation Test:03/30/2017  Patient Saturations on Room Air at Rest = 95%  Patient Saturations on Room Air while Ambulating = 87%  Patient Saturations on 2 Liters of oxygen while Ambulating = 96%  Please briefly explain why patient needs home oxygen:  2-D echo>>02/03/2017:EF 80-99% grade 1 diastolic dysfunction  VQ IPJA:25/11/3974 IMPRESSION: Indeterminate scan for pulmonary embolus. Exam is limited due to severe chronic interstitial lung disease/COPD present.  PFT July 2017 pulmonary function testing FVC 1.81 L 90% predicted, total lung capacity 3.79 L 72% predicted, DLCO 8.29 32% predicted. July 2018: Ratio 85%, FVC 1.88 L, 95% predicted, total lung capacity 3.37 L 64% predicted, DLCO 7.14 27 percent predicted  6MW July 2017 6 minute walk distance 324 m, O2 saturation   CBC Latest Ref Rng & Units 01/30/2017 10/24/2016 03/17/2015  WBC 4.0 - 10.5 K/uL 10.3 9.4 10.4  Hemoglobin 12.0 - 15.0 g/dL 14.3 13.6 14.5  Hematocrit 36.0 - 46.0 % 42.8 40.2 43.8  Platelets 150.0 - 400.0 K/uL 198.0 203.0 221.0    BMP Latest Ref Rng & Units 10/24/2016 08/05/2016 11/30/2015  Glucose 70 - 99 mg/dL 105(H) 106(H) 93  BUN 6 -  23 mg/dL 13 14 6(L)  Creatinine 0.40 - 1.20 mg/dL 0.75 0.78 0.74  Sodium 135 - 145 mEq/L 140 140 139  Potassium 3.5 - 5.1 mEq/L 4.0 4.3 4.5  Chloride 96 - 112 mEq/L 106 105 103  CO2 19 - 32 mEq/L 28 30 26   Calcium 8.4 - 10.5 mg/dL 9.4 9.4 9.6    BNP No results found for: BNP  ProBNP    Component Value Date/Time   PROBNP 20.0 01/30/2017 1245    PFT    Component Value Date/Time    FEV1PRE 1.50 01/30/2017 1104   FEV1POST 1.61 01/30/2017 1104   FVCPRE 1.89 01/30/2017 1104   FVCPOST 1.88 01/30/2017 1104   TLC 3.37 01/30/2017 1104   DLCOUNC 7.14 01/30/2017 1104   PREFEV1FVCRT 79 01/30/2017 1104   PSTFEV1FVCRT 85 01/30/2017 1104    No results found.   Past medical hx Past Medical History:  Diagnosis Date  . Allergic rhinitis   . Allergy   . CAD (coronary artery disease)    Mild CAD by cath 2008  . DJD (degenerative joint disease)    rheumatoid  . DVT (deep venous thrombosis) ([place])   . Dyslipidemia   . History of nuclear stress test    Myoview 5/17:  EF 74%, normal perfusion, low risk  . History of pulmonary embolism   . Hyperlipidemia   . Insomnia   . Osteoporosis   . Pulmonary fibrosis ([place])   . Rheumatoid arthritis ([place])      Social History   Tobacco Use  . Smoking status: Former Smoker    Packs/day: 0.30    Years: 20.00    Pack years: 6.00    Types: Cigarettes    Last attempt to quit: 07/11/1973    Years since quitting: 43.9  . Smokeless tobacco: Never Used  Substance Use Topics  . Alcohol use: No    Alcohol/week: 0.0 oz  . Drug use: No    [name] [name] reports that she quit smoking about 43 years ago. Her smoking use included cigarettes. She has a 6.00 pack-year smoking history. she has never used smokeless tobacco. She reports that she does not drink alcohol or use drugs.  Tobacco Cessation: Former smoker, quit 1975 with a 6 pack year smoking history.  Past surgical hx, Family hx, Social hx all reviewed.  Current Outpatient Medications on File Prior to Visit  Medication Sig  . ALPRAZolam (XANAX) 0.25 MG tablet Take 1 tablet (0.25 mg total) by mouth at bedtime as needed for anxiety.  [name] aspirin EC 81 MG tablet Take 1 tablet (81 mg total) by mouth daily.  [name] atorvastatin (LIPITOR) 10 MG tablet Take 1 tablet (10 mg total) by mouth daily.  . cetirizine (ZYRTEC) 10 MG tablet Take 5-10 mg by mouth daily as needed for allergies.  .  Cholecalciferol (VITAMIN D3) 2000 UNITS capsule Take 1 capsule (2,000 Units total) by mouth daily.  [name] enoxaparin (LOVENOX) 30 MG/0.3ML injection Inject 0.3 mLs (30 mg total) into the skin every 12 (twelve) hours as needed (travel only).  . fluticasone (FLONASE) 50 MCG/ACT nasal spray Place 2 sprays into both nostrils daily as needed for allergies or rhinitis.  . furosemide (LASIX) 20 MG tablet Take one tablet by mouth daily as needed for swelling  . InFLIXimab (REMICADE IV) Inject into the vein.  . isosorbide mononitrate (IMDUR) 30 MG 24 hr tablet Take 0.5 tablets (15 mg total) daily by mouth.  . latanoprost (XALATAN) 0.005 % ophthalmic solution Place 1 drop into both  eyes at bedtime.   . mupirocin ointment (BACTROBAN) 2 % Apply to affected area TID for 7 days.  [name] nystatin (MYCOSTATIN/NYSTOP) powder Apply 3 (three) times daily topically.  [name] Glycol-Propyl Glycol (SYSTANE) 0.4-0.3 % SOLN Apply 1 drop to eye daily as needed (for dry eyes).  . predniSONE (DELTASONE) 10 MG tablet Take 1.5 tablets (15 mg total) by mouth daily with breakfast.  . trimethoprim (TRIMPEX) 100 MG tablet Take 100 mg by mouth daily.   No current facility-administered medications on file prior to visit.      Allergies  Allergen Reactions  . Crestor [Rosuvastatin] Other (See Comments)    Muscle pain  . Lactose Intolerance (Gi) Other (See Comments)    Stomach upset  . Penicillins Hives  . Niacin Hives    Review Of Systems:  Constitutional:   No  weight loss, night sweats,  Fevers, chills, fatigue, or  lassitude.  HEENT:   No headaches,  Difficulty swallowing,  Tooth/dental problems, or  Sore throat,                No sneezing, itching, ear ache, nasal congestion, post nasal drip,   CV:  No chest pain,  Orthopnea, PND, swelling in lower extremities, anasarca, dizziness, palpitations, syncope.   GI  No heartburn, indigestion, abdominal pain, nausea, vomiting, diarrhea, change in bowel habits, loss of  appetite, bloody stools.   Resp: + shortness of breath with exertion less  at rest.  No excess mucus, no productive cough,  No non-productive cough,  No coughing up of blood.  No change in color of mucus.  +  Wheezing with exertion.  No chest wall deformity  Skin: no rash or lesions.  GU: no dysuria, change in color of urine, no urgency or frequency.  No flank pain, no hematuria   MS:  No joint pain or swelling.  No decreased range of motion.  No back pain.  Psych:  No change in mood or affect. No depression or anxiety.  No memory loss.   Vital Signs BP 118/68 (BP Location: Left Arm, Cuff Size: Normal)   Pulse (!) 103   Ht 5' 4.5" (1.638 m)   Wt 137 lb 4 oz (62.3 kg)   SpO2 93%   BMI 23.20 kg/m    Physical Exam:  General- No distress,  A&Ox3, pleasant elderly female , wearing oxygen per nasal cannula ENT: No sinus tenderness, TM clear, pale nasal mucosa, no oral exudate,no post nasal drip, no LAN Cardiac: S1, S2, regular rate and rhythm, no murmur Chest: No wheeze/ rales/ dullness; no accessory muscle use, no nasal flaring, no sternal retractions Abd.: Soft Non-tender, non-distended Ext: No clubbing cyanosis, trace edema per feet bilaterally Neuro:  normal strength, MAE x 4, appropriate Skin: No rashes, warm and dry Psych: normal mood and behavior   Assessment/Plan  Postinflammatory pulmonary fibrosis (HCC) Doing well on 10 mg prednisone daily Radiologist at her church suggested she start Symbicort for wheezing she has with exertion She is already on prednisone systemically PFT's do not show a significant BD response, but will try a rescue inhaler as therapeutic trial. Plan: Continue prednisone at 10 mg daily. We will prescribe a rescue inhaler for you to try. Use this 2 puffs up to 3-4 times daily as needed for shortness of breath Let us know if you need it more than 3-4 times a day. Rinse mouth after use. Follow up in 8 weeks Please contact office for sooner follow  up if symptoms do  not improve or worsen or seek emergency care   Chronic respiratory failure with hypoxia ([place]) Doing well on her oxygen @ 2 L [place] Plan: Continue oxygen at 2 L [place] Saturation goals are >92%  CAD (coronary artery disease) Pt. With complaints of headache with Imdur use ( Unsure if she is continuing to use it.) Notes some foot ankle swelling Stress test scheduled first week December. Plan: Follow up with cardiology about headache with Imdur. Take lasix as instructed by cardiology when you notice your feet/ ankles feel firm. Keep using your oxygen at 2 L [place] day and night. Follow up with Dr. Lake [name] or Judson Roch NP in 8 weeks to see how you are doing. Please contact office for sooner follow up if symptoms do not improve or worsen or seek emergency care      Magdalen Spatz, NP 06/05/2017  9:49 AM

## 2017-06-05 NOTE — Patient Instructions (Signed)
It is great to see you. Continue prednisone at 10 mg daily. We will prescribe a rescue inhaler Use this 2 puffs up to 3-4 times daily as needed for shortness of breath Let us know if you need it more than 3-4 times a day. Rinse mouth after use. Follow up with cardiology about headache with Imdur. Take lasix as instructed by cardiology when you notice your feet/ ankles feel firm. Keep using your oxygen at 2 L [place] day and night. Follow up with Dr. Lake Bells or Judson Roch NP in 8 weeks to see how you are doing. Please contact office for sooner follow up if symptoms do not improve or worsen or seek emergency care

## 2017-06-05 NOTE — Assessment & Plan Note (Signed)
Pt. With complaints of headache with Imdur use ( Unsure if she is continuing to use it.) Notes some foot ankle swelling Stress test scheduled first week December. Plan: Follow up with cardiology about headache with Imdur. Take lasix as instructed by cardiology when you notice your feet/ ankles feel firm. Keep using your oxygen at 2 L [place] day and night. Follow up with Dr. Lake Bells or Judson Roch NP in 8 weeks to see how you are doing. Please contact office for sooner follow up if symptoms do not improve or worsen or seek emergency care

## 2017-06-05 NOTE — Telephone Encounter (Signed)
Patient given detailed instructions per Myocardial Perfusion Study Information Sheet for the test on 06/08/17 at 0945. Patient notified to arrive 15 minutes early and that it is imperative to arrive on time for appointment to keep from having the test rescheduled.  If you need to cancel or reschedule your appointment, please call the office within 24 hours of your appointment. . Patient verbalized understanding.[name] [name] W    

## 2017-06-05 NOTE — Assessment & Plan Note (Signed)
Doing well on 10 mg prednisone daily Radiologist at her church suggested she start Symbicort for wheezing she has with exertion She is already on prednisone systemically PFT's do not show a significant BD response, but will try a rescue inhaler as therapeutic trial. Plan: Continue prednisone at 10 mg daily. We will prescribe a rescue inhaler for you to try. Use this 2 puffs up to 3-4 times daily as needed for shortness of breath Let us know if you need it more than 3-4 times a day. Rinse mouth after use. Follow up in 8 weeks Please contact office for sooner follow up if symptoms do not improve or worsen or seek emergency care

## 2017-06-05 NOTE — Progress Notes (Signed)
Reviewed, agree 

## 2017-06-05 NOTE — Assessment & Plan Note (Signed)
Doing well on her oxygen @ 2 L [place] Plan: Continue oxygen at 2 L [place] Saturation goals are >92%

## 2017-06-08 ENCOUNTER — Ambulatory Visit (HOSPITAL_COMMUNITY): Payer: Medicare Other | Attending: Internal Medicine

## 2017-06-08 DIAGNOSIS — I25119 Atherosclerotic heart disease of native coronary artery with unspecified angina pectoris: Secondary | ICD-10-CM | POA: Insufficient documentation

## 2017-06-08 DIAGNOSIS — E785 Hyperlipidemia, unspecified: Secondary | ICD-10-CM | POA: Insufficient documentation

## 2017-06-08 DIAGNOSIS — Z86718 Personal history of other venous thrombosis and embolism: Secondary | ICD-10-CM | POA: Insufficient documentation

## 2017-06-08 LAB — MYOCARDIAL PERFUSION IMAGING
LV dias vol: 43 mL (ref 46–106)
LV sys vol: 1 mL
Peak HR: 107 {beats}/min
RATE: 0.37
Rest HR: 79 {beats}/min
SDS: 3
SRS: 14
SSS: 16
TID: 0.99

## 2017-06-08 MED ORDER — TECHNETIUM TC 99M TETROFOSMIN IV KIT
10.8000 | PACK | Freq: Once | INTRAVENOUS | Status: AC | PRN
  Administered 2017-06-08: 10.8 via INTRAVENOUS
  Filled 2017-06-08: qty 11

## 2017-06-08 MED ORDER — REGADENOSON 0.4 MG/5ML IV SOLN
0.4000 mg | Freq: Once | INTRAVENOUS | Status: AC
  Administered 2017-06-08: 0.4 mg via INTRAVENOUS

## 2017-06-08 MED ORDER — TECHNETIUM TC 99M TETROFOSMIN IV KIT
33.0000 | PACK | Freq: Once | INTRAVENOUS | Status: AC | PRN
  Administered 2017-06-08: 33 via INTRAVENOUS
  Filled 2017-06-08: qty 33

## 2017-06-09 ENCOUNTER — Telehealth: Payer: Self-pay | Admitting: Cardiovascular Disease

## 2017-06-09 NOTE — Telephone Encounter (Signed)
-----   Message from Imogene Burn, PA-C sent at 06/09/2017  9:55 AM EST ----- Completely normal nuclear stress test

## 2017-06-09 NOTE — Telephone Encounter (Signed)
[name] [name] is returning a call about her test results

## 2017-06-09 NOTE — Telephone Encounter (Signed)
Returned pts call and she has been made aware of her stress test results. Pt thanked me for the call.

## 2017-06-15 ENCOUNTER — Other Ambulatory Visit: Payer: Self-pay | Admitting: Cardiovascular Disease

## 2017-06-20 NOTE — Progress Notes (Signed)
Chief Complaint  Patient presents with  . Coronary Artery Disease     History of Present Illness: 83 yo female with history of CAD, DVT/PE, HLD, pulmonary fibrosis who is here today for cardiac follow up. I met her in 2013 with c/o chest pain and stress test in 2013 showed no ischemia. Remote cath in 2008 with mild CAD. Repeat cath in December 2014 and was found to have 30% mid LAD, 30% mid Circumflex, 20% mid RCA. She has not tolerated statins in the past. Episode of chest pain after a car trip May 2017. Nuclear stress test May 2017 with no ischemia. Chest CTA without acute PE. Echo July 2018 with normal LV systolic function, ATFT=73%, grade 1 diastolic dysfunction, no significant valve disease. She has continued to have chest pains. Nuclear stress test 06/08/17 with no ischemia. She is now wearing supplemental oxygen 24/7.  She is here today for follow up. The patient denies any dyspnea, palpitations, orthopnea, PND, dizziness, near syncope or syncope. She has chronic chest pains, no change. This does not occur every day. She has mild LE edema.   Primary Care Physician: Hoyt Koch, MD  Past Medical History:  Diagnosis Date  . Allergic rhinitis   . Allergy   . CAD (coronary artery disease)    Mild CAD by cath 2008  . DJD (degenerative joint disease)    rheumatoid  . DVT (deep venous thrombosis) ([place])   . Dyslipidemia   . History of nuclear stress test    Myoview 5/17:  EF 74%, normal perfusion, low risk  . History of pulmonary embolism   . Hyperlipidemia   . Insomnia   . Osteoporosis   . Pulmonary fibrosis ([place])   . Rheumatoid arthritis [facility])     Past Surgical History:  Procedure Laterality Date  . ABDOMINAL HYSTERECTOMY  1974  . APPENDECTOMY  1959  . CATARACT EXTRACTION    . LEFT HEART CATHETERIZATION WITH CORONARY ANGIOGRAM N/A 06/18/2013   Procedure: LEFT HEART CATHETERIZATION WITH CORONARY ANGIOGRAM;  Surgeon: Peter M Martinique, MD;  Location: [facility] CATH LAB;   Service: Cardiovascular;  Laterality: N/A;  . TONSILLECTOMY  1941, 1951  . TOTAL KNEE ARTHROPLASTY  1997, 2007   Bilateral    Current Outpatient Medications  Medication Sig Dispense Refill  . ALPRAZolam (XANAX) 0.25 MG tablet Take 1 tablet (0.25 mg total) by mouth at bedtime as needed for anxiety. 15 tablet 0  . aspirin EC 81 MG tablet Take 1 tablet (81 mg total) by mouth daily.    [name] atorvastatin (LIPITOR) 10 MG tablet Take 1 tablet (10 mg total) by mouth daily. 90 tablet 3  . cetirizine (ZYRTEC) 10 MG tablet Take 5-10 mg by mouth daily as needed for allergies.    . Cholecalciferol (VITAMIN D3) 2000 UNITS capsule Take 1 capsule (2,000 Units total) by mouth daily. 100 capsule 3  . enoxaparin (LOVENOX) 30 MG/0.3ML injection Inject 0.3 mLs (30 mg total) into the skin every 12 (twelve) hours as needed (travel only). 10 Syringe 0  . fluticasone (FLONASE) 50 MCG/ACT nasal spray Place 2 sprays into both nostrils daily as needed for allergies or rhinitis. 16 g 3  . furosemide (LASIX) 20 MG tablet Take 1 tablet by mouth daily as needed for swelling. 30 tablet 11  . InFLIXimab (REMICADE IV) Inject into the vein.    . isosorbide mononitrate (IMDUR) 30 MG 24 hr tablet Take 0.5 tablets (15 mg total) daily by mouth. 45 tablet 3  . latanoprost (  XALATAN) 0.005 % ophthalmic solution Place 1 drop into both eyes at bedtime.   0  . levalbuterol (XOPENEX HFA) 45 MCG/ACT inhaler Inhale 2 puffs into the lungs every 6 (six) hours as needed for wheezing. 1 Inhaler 12  . mupirocin ointment (BACTROBAN) 2 % Apply to affected area TID for 7 days. 30 g 3  . nystatin (MYCOSTATIN/NYSTOP) powder Apply 3 (three) times daily topically. 60 g 0  . Polyethyl Glycol-Propyl Glycol (SYSTANE) 0.4-0.3 % SOLN Apply 1 drop to eye daily as needed (for dry eyes).    . predniSONE (DELTASONE) 10 MG tablet Take 1.5 tablets (15 mg total) by mouth daily with breakfast. 45 tablet 0  . trimethoprim (TRIMPEX) 100 MG tablet Take 100 mg by mouth  daily.     No current facility-administered medications for this visit.     Allergies  Allergen Reactions  . Crestor [Rosuvastatin] Other (See Comments)    Muscle pain  . Lactose Intolerance (Gi) Other (See Comments)    Stomach upset  . Penicillins Hives  . Niacin Hives    Social History   Socioeconomic History  . Marital status: Widowed    Spouse name: Not on file  . Number of children: 2  . Years of education: Not on file  . Highest education level: Not on file  Social Needs  . Financial resource strain: Not on file  . Food insecurity - worry: Not on file  . Food insecurity - inability: Not on file  . Transportation needs - medical: Not on file  . Transportation needs - non-medical: Not on file  Occupational History  . Occupation: Publishing rights manager    Comment: retired  Tobacco Use  . Smoking status: Former Smoker    Packs/day: 0.30    Years: 20.00    Pack years: 6.00    Types: Cigarettes    Last attempt to quit: 07/11/1973    Years since quitting: 43.9  . Smokeless tobacco: Never Used  Substance and Sexual Activity  . Alcohol use: No    Alcohol/week: 0.0 oz  . Drug use: No  . Sexual activity: No  Other Topics Concern  . Not on file  Social History Narrative   Diet:   Do you drink/eat things with caffeine? Yes   Marital status:        Widowed                      What year were you married?1954   Do you live in a house, apartment, assisted living, condo, trailer, etc)? House   Is it one or more stories? 1 1/4   How many persons live in your home?1   Do you have any pets in your home? No   Current or past profession:  Publishing rights manager   Do you exercise?        Yes                                            Type & how often:  Walk                                    Do you have a living will? Yes   Do you have a DNR Form? Yes   Do you have  a POA/HPOA forms?  Yes    Family History  Problem Relation Age of Onset  . Kidney disease  Daughter 5  . Cancer Daughter   . Heart attack Father 43  . Alzheimer's disease Sister   . Heart disease Sister   . Alzheimer's disease Sister     Review of Systems:  As stated in the HPI and otherwise negative.   BP (!) 100/56   Pulse (!) 103   Ht 5' 4.5" (1.638 m)   Wt 144 lb 12.8 oz (65.7 kg)   SpO2 96%   BMI 24.47 kg/m   Physical Examination:  General: Well developed, well nourished, NAD  HEENT: OP clear, mucus membranes moist  SKIN: warm, dry. No rashes. Neuro: No focal deficits  Musculoskeletal: Muscle strength 5/5 all ext  Psychiatric: Mood and affect normal  Neck: No JVD, no carotid bruits, no thyromegaly, no lymphadenopathy.  Lungs:Clear bilaterally, no wheezes, rhonci, crackles Cardiovascular: Regular rate and rhythm. No murmurs, gallops or rubs. Abdomen:Soft. Bowel sounds present. Non-tender.  Extremities: No lower extremity edema. Pulses are 2 + in the bilateral DP/PT.  Echo 02/03/17: - Left ventricle: The cavity size was normal. Systolic function was   normal. The estimated ejection fraction was in the range of 60%   to 65%. Wall motion was normal; there were no regional wall   motion abnormalities. There was an increased relative   contribution of atrial contraction to ventricular filling.   Doppler parameters are consistent with abnormal left ventricular   relaxation (grade 1 diastolic dysfunction). - Aortic valve: Trileaflet; normal thickness, mildly calcified   leaflets.  Cardiac cath 06/18/13: Left mainstem: Normal  Left anterior descending (LAD): Mild calcification. 30% disease in the mid vessel at the take off of the first diagonal.  Left circumflex (LCx): 30% disease in the mid vessel.  Right coronary artery (RCA): Mild calcification. Mild disease in the proximal and mid vessel to 20%.  Left ventriculography: Left ventricular systolic function is normal, LVEF is estimated at 55-65%, there is no significant mitral regurgitation   EKG:  EKG is not  ordered today. The ekg ordered today demonstrates   Recent Labs: 10/24/2016: ALT 18; BUN 13; Creatinine, Ser 0.75; Magnesium 1.7; Potassium 4.0; Sodium 140; TSH 1.83 01/30/2017: Hemoglobin 14.3; Platelets 198.0; Pro B Natriuretic peptide (BNP) 20.0   Lipid Panel    Component Value Date/Time   CHOL 177 12/23/2016 0931   TRIG 96 12/23/2016 0931   HDL 70 12/23/2016 0931   CHOLHDL 2.5 12/23/2016 0931   CHOLHDL 3.1 10/13/2015 0945   VLDL 26 10/13/2015 0945   LDLCALC 88 12/23/2016 0931     Wt Readings from Last 3 Encounters:  06/22/17 144 lb 12.8 oz (65.7 kg)  06/05/17 137 lb 4 oz (62.3 kg)  05/29/17 138 lb (62.6 kg)     Other studies Reviewed: Additional studies/ records that were reviewed today include: . Review of the above records demonstrates:    Assessment and Plan:   1. CAD without angina: No chest pains that are suggestive of angina. Her chronic chest pains are not felt to be cardiac related. She is known to have mild non-obstructive CAD, last cath in 2014 and normal nuclear stress test in November 2018. Will continue ASA, Imdur and statin. She has not wished to start a beta blocker.     2. Hyperlipidemia: LDL near goal of 70. Will continue statin.    3. Chronic diastolic CHF: Weight is up several pounds. Will have her take Lasix  daily for 5 days then prn.   Current medicines are reviewed at length with the patient today.  The patient does not have concerns regarding medicines.  The following changes have been made:  no change  Labs/ tests ordered today include:   No orders of the defined types were placed in this encounter.  Disposition:   FU with me  in 12  months  Signed, Lauree Chandler, MD 06/22/2017 11:39 AM    [facility] [address], [place], [place]  [address] Phone: [phone]; Fax: [phone]

## 2017-06-22 ENCOUNTER — Ambulatory Visit (INDEPENDENT_AMBULATORY_CARE_PROVIDER_SITE_OTHER): Payer: Medicare Other | Admitting: Cardiovascular Disease

## 2017-06-22 ENCOUNTER — Encounter: Payer: Self-pay | Admitting: Cardiovascular Disease

## 2017-06-22 VITALS — BP 100/56 | HR 103 | Ht 64.5 in | Wt 144.8 lb

## 2017-06-22 DIAGNOSIS — I5033 Acute on chronic diastolic (congestive) heart failure: Secondary | ICD-10-CM | POA: Diagnosis not present

## 2017-06-22 DIAGNOSIS — I25119 Atherosclerotic heart disease of native coronary artery with unspecified angina pectoris: Secondary | ICD-10-CM | POA: Diagnosis not present

## 2017-06-22 DIAGNOSIS — E785 Hyperlipidemia, unspecified: Secondary | ICD-10-CM

## 2017-06-22 DIAGNOSIS — I2510 Atherosclerotic heart disease of native coronary artery without angina pectoris: Secondary | ICD-10-CM

## 2017-06-22 NOTE — Patient Instructions (Signed)

## 2017-06-25 ENCOUNTER — Other Ambulatory Visit: Payer: Self-pay | Admitting: Pulmonary Disease

## 2017-07-06 DIAGNOSIS — M0579 Rheumatoid arthritis with rheumatoid factor of multiple sites without organ or systems involvement: Secondary | ICD-10-CM | POA: Diagnosis not present

## 2017-07-24 ENCOUNTER — Telehealth: Payer: Self-pay | Admitting: Internal Medicine

## 2017-07-24 DIAGNOSIS — J9611 Chronic respiratory failure with hypoxia: Secondary | ICD-10-CM

## 2017-07-24 DIAGNOSIS — J8410 Pulmonary fibrosis, unspecified: Secondary | ICD-10-CM

## 2017-07-24 NOTE — Addendum Note (Signed)
Addended by: Len Blalock on: 07/24/2017 03:01 PM   Modules accepted: Orders

## 2017-07-24 NOTE — Telephone Encounter (Signed)
Caryl Pina, I'm happy to prescribe a POC for Ms. Gingrich.

## 2017-07-24 NOTE — Telephone Encounter (Signed)
This should go to pulmonary will forward to her pulmonologist.

## 2017-07-24 NOTE — Telephone Encounter (Signed)
Copied from [place] #[id number]. Topic: Inquiry >> Jul 24, 2017 12:51 PM Oliver Pila B wrote: Reason for CRM: pt called to state that she has ordered a portable concentrator which is for her oxygen, but is needing approval from a physician in order to receive it. Pt is getting the machine from a company called [employer] and you can speak w/ Apolonio Schneiders [phone] Contact pt to advise if needed as well

## 2017-07-24 NOTE — Telephone Encounter (Signed)
Order placed. Nothing further needed. 

## 2017-07-25 NOTE — Progress Notes (Signed)
Subjective:   [name] [name] is a 84 y.o. female who presents for Medicare Annual (Subsequent) preventive examination.  Review of Systems:  No ROS.  Medicare Wellness Visit. Additional risk factors are reflected in the social history.  Cardiac Risk Factors include: advanced age (>18men, >66 women);dyslipidemia;hypertension;sedentary lifestyle Sleep patterns: gets up 1-2 times nightly to void and sleeps 5-6 hours nightly.    Home Safety/Smoke Alarms: Feels safe in home. Smoke alarms in place.  Living environment; residence and Firearm Safety: 2-story house, no firearms.Lives alone, no needs for DME, good support system Seat Belt Safety/Bike Helmet: Wears seat belt.     Objective:     Vitals: BP 106/60   Pulse 85   Resp 18   Ht 5\' 5"  (1.651 m)   Wt 141 lb (64 kg)   SpO2 98% Comment: 2L  BMI 23.46 kg/m   Body mass index is 23.46 kg/m.  Advanced Directives 07/26/2017 07/21/2016 12/02/2014 09/06/2014 06/17/2013 12/31/2012  Does Patient Have a Medical Advance Directive? Yes Yes Yes Yes Patient has advance directive, copy not in chart Patient has advance directive, copy not in chart  Type of Advance Directive [place];Living will [place];Living will - [place];Living will - [place];Living will  Copy of [place] in Chart? No - copy requested No - copy requested No - copy requested - - Copy requested from family    Tobacco Social History   Tobacco Use  Smoking Status Former Smoker  . Packs/day: 0.30  . Years: 20.00  . Pack years: 6.00  . Types: Cigarettes  . Last attempt to quit: 07/11/1973  . Years since quitting: 44.0  Smokeless Tobacco Never Used     Counseling given: Not Answered  Past Medical History:  Diagnosis Date  . Allergic rhinitis   . Allergy   . CAD (coronary artery disease)    Mild CAD by cath 2008  . DJD (degenerative joint disease)    rheumatoid  . DVT (deep  venous thrombosis) ([place])   . Dyslipidemia   . History of nuclear stress test    Myoview 5/17:  EF 74%, normal perfusion, low risk  . History of pulmonary embolism   . Hyperlipidemia   . Insomnia   . Osteoporosis   . Pulmonary fibrosis ([place])   . Rheumatoid arthritis [facility])    Past Surgical History:  Procedure Laterality Date  . ABDOMINAL HYSTERECTOMY  1974  . APPENDECTOMY  1959  . CATARACT EXTRACTION    . LEFT HEART CATHETERIZATION WITH CORONARY ANGIOGRAM N/A 06/18/2013   Procedure: LEFT HEART CATHETERIZATION WITH CORONARY ANGIOGRAM;  Surgeon: Peter M Martinique, MD;  Location: [facility] CATH LAB;  Service: Cardiovascular;  Laterality: N/A;  . TONSILLECTOMY  1941, 1951  . TOTAL KNEE ARTHROPLASTY  1997, 2007   Bilateral   Family History  Problem Relation Age of Onset  . Kidney disease Daughter 5  . Cancer Daughter   . Heart attack Father 59  . Alzheimer's disease Sister   . Heart disease Sister   . Alzheimer's disease Sister    Social History   Socioeconomic History  . Marital status: Widowed    Spouse name: None  . Number of children: 2  . Years of education: None  . Highest education level: None  Social Needs  . Financial resource strain: Not hard at all  . Food insecurity - worry: Never true  . Food insecurity - inability: Never true  .  Transportation needs - medical: No  . Transportation needs - non-medical: No  Occupational History  . Occupation: Publishing rights manager    Comment: retired  Tobacco Use  . Smoking status: Former Smoker    Packs/day: 0.30    Years: 20.00    Pack years: 6.00    Types: Cigarettes    Last attempt to quit: 07/11/1973    Years since quitting: 44.0  . Smokeless tobacco: Never Used  Substance and Sexual Activity  . Alcohol use: No    Alcohol/week: 0.0 oz  . Drug use: No  . Sexual activity: No  Other Topics Concern  . None  Social History Narrative   Diet:   Do you drink/eat things with caffeine? Yes   Marital status:         Widowed                      What year were you married?1954   Do you live in a house, apartment, assisted living, condo, trailer, etc)? House   Is it one or more stories? 1 1/4   How many persons live in your home?1   Do you have any pets in your home? No   Current or past profession:  Publishing rights manager   Do you exercise?        Yes                                            Type & how often:  Walk                                    Do you have a living will? Yes   Do you have a DNR Form? Yes   Do you have a POA/HPOA forms?  Yes    Outpatient Encounter Medications as of 07/26/2017  Medication Sig  . ALPRAZolam (XANAX) 0.25 MG tablet Take 1 tablet (0.25 mg total) by mouth at bedtime as needed for anxiety.  [name] aspirin EC 81 MG tablet Take 1 tablet (81 mg total) by mouth daily.  [name] atorvastatin (LIPITOR) 10 MG tablet Take 1 tablet (10 mg total) by mouth daily.  . cetirizine (ZYRTEC) 10 MG tablet Take 5-10 mg by mouth daily as needed for allergies.  . Cholecalciferol (VITAMIN D3) 2000 UNITS capsule Take 1 capsule (2,000 Units total) by mouth daily.  [name] enoxaparin (LOVENOX) 30 MG/0.3ML injection Inject 0.3 mLs (30 mg total) into the skin every 12 (twelve) hours as needed (travel only).  . fluticasone (FLONASE) 50 MCG/ACT nasal spray Place 2 sprays into both nostrils daily as needed for allergies or rhinitis.  . furosemide (LASIX) 20 MG tablet Take 1 tablet by mouth daily as needed for swelling.  . InFLIXimab (REMICADE IV) Inject into the vein.  . isosorbide mononitrate (IMDUR) 30 MG 24 hr tablet Take 0.5 tablets (15 mg total) daily by mouth.  . latanoprost (XALATAN) 0.005 % ophthalmic solution Place 1 drop into both eyes at bedtime.   . levalbuterol (XOPENEX HFA) 45 MCG/ACT inhaler Inhale 2 puffs into the lungs every 6 (six) hours as needed for wheezing.  [name] Glycol-Propyl Glycol (SYSTANE) 0.4-0.3 % SOLN Apply 1 drop to eye daily as needed (for dry eyes).  . predniSONE  (DELTASONE) 10 MG tablet TAKE  ONE AND ONE-HALF TABLETS DAILY WITH BREAKFAST  . trimethoprim (TRIMPEX) 100 MG tablet Take 100 mg by mouth daily.  . [DISCONTINUED] mupirocin ointment (BACTROBAN) 2 % Apply to affected area TID for 7 days. (Patient not taking: Reported on 07/26/2017)  . [DISCONTINUED] nystatin (MYCOSTATIN/NYSTOP) powder Apply 3 (three) times daily topically. (Patient not taking: Reported on 07/26/2017)   No facility-administered encounter medications on file as of 07/26/2017.     Activities of Daily Living In your present state of health, do you have any difficulty performing the following activities: 07/26/2017  Hearing? N  Vision? N  Difficulty concentrating or making decisions? N  Walking or climbing stairs? N  Dressing or bathing? N  Doing errands, shopping? N  Preparing Food and eating ? N  Using the Toilet? N  In the past six months, have you accidently leaked urine? N  Do you have problems with loss of bowel control? N  Managing your Medications? N  Managing your Finances? N  Housekeeping or managing your Housekeeping? N  Some recent data might be hidden    Patient Care Team: Hoyt Koch, MD as PCP - General (Internal Medicine) Burnell Blanks, MD as Consulting Physician (Cardiology) Marygrace Drought, MD as Consulting Physician (Ophthalmology) Juanito Doom, MD as Consulting Physician (Pulmonary Disease) Gavin Pound, MD as Consulting Physician (Rheumatology)    Assessment:   This is a routine wellness examination for Sun[place]a. Physical assessment deferred to PCP.   Exercise Activities and Dietary recommendations Current Exercise Habits: The patient does not participate in regular exercise at present Diet (meal preparation, eat out, water intake, caffeinated beverages, dairy products, fruits and vegetables): in general, a "healthy" diet  , well balanced   Reviewed heart healthy diet, encouraged patient to increase daily water  intake.   Goals    . Exercise 3x per week (30 min per time)     Increase physical activity.     . Patient Stated     Increase the amount water intake daily. Place 2 bottles of water out daily to remind me to drink. Increase physical activity by using my exercise equipment x3 weekly 10-20 minutes and/or exercise while I watch the news. Go to bed earlier, I will cut the news off by 12:00       Fall Risk Fall Risk  07/21/2016 06/01/2016 03/07/2016 09/11/2014 05/08/2013  Falls in the past year? No No No Yes No  Comment - - Emmi Telephone Survey: data to providers prior to load - -  Number falls in past yr: - - - 2 or more -  Injury with Fall? - - - Yes -    Depression Screen PHQ 2/9 Scores 07/21/2016 06/01/2016 03/05/2015 09/11/2014  PHQ - 2 Score 0 0 0 0     Cognitive Function MMSE - Mini Mental State Exam 07/26/2017  Orientation to time 5  Orientation to Place 5  Registration 3  Attention/ Calculation 5  Recall 1  Language- name 2 objects 2  Language- repeat 1  Language- follow 3 step command 3  Language- read & follow direction 1  Write a sentence 1  Copy design 1  Total score 28        Immunization History  Administered Date(s) Administered  . Influenza Split 05/09/2011  . Influenza Whole 04/10/2010, 04/10/2012  . Influenza,inj,Quad PF,6+ Mos 07/25/2013, 04/29/2014, 04/16/2015  . Influenza-Unspecified 04/24/2016  . Pneumococcal Conjugate-13 04/16/2015  . Pneumococcal Polysaccharide-23 07/26/2017  . Tdap 11/12/2013  . Zoster 07/11/2013   Screening  Tests Health Maintenance  Topic Date Due  . INFLUENZA VACCINE  10/09/2017 (Originally 02/08/2017)  . TETANUS/TDAP  11/13/2023  . DEXA SCAN  Completed  . PNA vac Low Risk Adult  Completed       Plan:    Continue doing brain stimulating activities (puzzles, reading, adult coloring books, staying active) to keep memory sharp.   Continue to eat heart healthy diet (full of fruits, vegetables, whole grains, lean protein,  water--limit salt, fat, and sugar intake) and increase physical activity as tolerated.  I have personally reviewed and noted the following in the patient's chart:   . Medical and social history . Use of alcohol, tobacco or illicit drugs  . Current medications and supplements . Functional ability and status . Nutritional status . Physical activity . Advanced directives . List of other physicians . Vitals . Screenings to include cognitive, depression, and falls . Referrals and appointments  In addition, I have reviewed and discussed with patient certain preventive protocols, quality metrics, and best practice recommendations. A written personalized care plan for preventive services as well as general preventive health recommendations were provided to patient.     Michiel Cowboy, RN  07/26/2017

## 2017-07-26 ENCOUNTER — Ambulatory Visit (INDEPENDENT_AMBULATORY_CARE_PROVIDER_SITE_OTHER): Payer: Medicare Other | Admitting: *Deleted

## 2017-07-26 ENCOUNTER — Telehealth: Payer: Self-pay | Admitting: Acute Care

## 2017-07-26 VITALS — BP 106/60 | HR 85 | Resp 18 | Ht 65.0 in | Wt 141.0 lb

## 2017-07-26 DIAGNOSIS — Z0000 Encounter for general adult medical examination without abnormal findings: Secondary | ICD-10-CM | POA: Diagnosis not present

## 2017-07-26 DIAGNOSIS — Z23 Encounter for immunization: Secondary | ICD-10-CM | POA: Diagnosis not present

## 2017-07-26 NOTE — Patient Instructions (Addendum)
Continue doing brain stimulating activities (puzzles, reading, adult coloring books, staying active) to keep memory sharp.   Continue to eat heart healthy diet (full of fruits, vegetables, whole grains, lean protein, water--limit salt, fat, and sugar intake) and increase physical activity as tolerated.   Ms. West , Thank you for taking time to come for your Medicare Wellness Visit. I appreciate your ongoing commitment to your health goals. Please review the following plan we discussed and let me know if I can assist you in the future.   These are the goals we discussed: Goals    . Exercise 3x per week (30 min per time)     Increase physical activity.     . Patient Stated     Increase the amount water intake daily. Place 2 bottles of water out daily to remind me to drink. Increase physical activity by using my exercise equipment x3 weekly 10-20 minutes and/or exercise while I watch the news. Go to bed earlier, I will cut the news by 12:00       This is a list of the screening recommended for you and due dates:  Health Maintenance  Topic Date Due  . Pneumonia vaccines (2 of 2 - PPSV23) 04/15/2016  . Flu Shot  10/09/2017*  . Tetanus Vaccine  11/13/2023  . DEXA scan (bone density measurement)  Completed  *Topic was postponed. The date shown is not the original due date.   It is important to avoid accidents which may result in broken bones.  Here are a few ideas on how to make your home safer so you will be less likely to trip or fall.  1. Use nonskid mats or non slip strips in your shower or tub, on your bathroom floor and around sinks.  If you know that you have spilled water, wipe it up! 2. In the bathroom, it is important to have properly installed grab bars on the walls or on the edge of the tub.  Towel racks are NOT strong enough for you to hold onto or to pull on for support. 3. Stairs and hallways should have enough light.  Add lamps or night lights if you need ore light. 4. It is  good to have handrails on both sides of the stairs if possible.  Always fix broken handrails right away. 5. It is important to see the edges of steps.  Paint the edges of outdoor steps white so you can see them better.  Put colored tape on the edge of inside steps. 6. Throw-rugs are dangerous because they can slide.  Removing the rugs is the best idea, but if they must stay, add adhesive carpet tape to prevent slipping. 7. Do not keep things on stairs or in the halls.  Remove small furniture that blocks the halls as it may cause you to trip.  Keep telephone and electrical cords out of the way where you walk. 8. Always were sturdy, rubber-soled shoes for good support.  Never wear just socks, especially on the stairs.  Socks may cause you to slip or fall.  Do not wear full-length housecoats as you can easily trip on the bottom.  9. Place the things you use the most on the shelves that are the easiest to reach.  If you use a stepstool, make sure it is in good condition.  If you feel unsteady, DO NOT climb, ask for help. 10. If a health professional advises you to use a cane or walker, do not be ashamed.  These items can keep you from falling and breaking your bones.

## 2017-07-26 NOTE — Telephone Encounter (Signed)
Spoke with Apolonio Schneiders at [facility], requesting that BQ sign order for stationary O2 and POC that was faxed 30 minutes ago.  I advised Apolonio Schneiders that we would have it signed and faxed as we are able.  Apolonio Schneiders requests that BQ only signs/dates at bottom and fills in appropriate liter flow, and nothing else is needed on form.  PCC's please advise if you've yet received this form.  Thanks!

## 2017-07-26 NOTE — Telephone Encounter (Signed)
I received the form and gave it to Raquel Sarna who was working with Dr. Lake Bells this morning for him to sign and fill out

## 2017-07-27 ENCOUNTER — Telehealth: Payer: Self-pay | Admitting: Acute Care

## 2017-07-27 NOTE — Telephone Encounter (Signed)
Called Apolonio Schneiders letting her know we had received the fax from her regarding pt.  I stated to Apolonio Schneiders that BQ was not going to be back in the office until 1/23.   Apolonio Schneiders asked me if one of our NP's would be able to sign it sooner than BQ.  I stated to Apolonio Schneiders that TP would be in the office tomorrow, 07/28/17 and we would get her to sign and fill it out tomorrow and fax back to her.  Apolonio Schneiders stated that that was fine for [place] to have TP mark out BQ's name and put her name and NPI number.  I gave the form to [place] for her to give to TP tomorrow to sign and fill out so we can fax it back in.  Will route this to Jess for her to follow up on.

## 2017-07-27 NOTE — Telephone Encounter (Signed)
[name] [name]   [phone]   The order has to be faxed to [facility]

## 2017-07-27 NOTE — Telephone Encounter (Signed)
Apolonio Schneiders calling back and is wanting this order faxed back if possible, she can be reached @ [phone].Hillery Hunter

## 2017-07-27 NOTE — Telephone Encounter (Signed)
Form has been received and placed in BQ's folder for signature.

## 2017-07-27 NOTE — Telephone Encounter (Signed)
Form was not returned to [facility].  Called Rachel at [facility] to have form refaxed.  Will await fax.

## 2017-07-27 NOTE — Telephone Encounter (Signed)
Duplicate encounter. See 07/26/17 phone note.

## 2017-07-27 NOTE — Telephone Encounter (Signed)
Please advise if this form has been completed. Thanks.

## 2017-07-28 NOTE — Telephone Encounter (Signed)
Form filled out and signed by TP Faxed back to [facility] @ [phone]  LMOM TCB x1 for Apolonio Schneiders to verify that fax was received

## 2017-07-28 NOTE — Telephone Encounter (Signed)
[name] [name] is calling back to follow up on this request, CB is [phone].

## 2017-07-28 NOTE — Telephone Encounter (Signed)
[name] [name] from [place]  received the prescription. She just wanted to let you know.   [phone]

## 2017-07-28 NOTE — Telephone Encounter (Signed)
Noted  

## 2017-08-04 ENCOUNTER — Ambulatory Visit: Payer: Medicare Other | Admitting: Pulmonary Disease

## 2017-08-04 NOTE — Progress Notes (Deleted)
Subjective:    Patient ID: [name] [name], female    DOB: [birth year], 84 y.o.   MRN: [id number]  Synopsis: This is a very pleasant female who is diagnosed with interstitial lung disease in her mid 32s after evaluation at [facility] and [facility]. She was found to have a UIP pattern on CT scan and serology testing revealed a very high rheumatoid factor as well as anti-CCP antibody. She was followed by Dr. Elgie Collard at [facility] who felt like she had rheumatoid arthritis associated interstitial lung disease. He never treated her with immunosuppressive therapy. From 2000 07/09/2013 she's continued to follow in both [place] and due to her lung function has remained stable during that time. She has not developed new shortness of breath. She has had repeated episodes of pulmonary emboli. The most recent was in the summer of 2014 when she developed her fourth pulmonary embolism after returning from a trip to [place].   HPI No chief complaint on file.  *** has been started on 10mg  prednisone daily; infliximab  Past Medical History:  Diagnosis Date  . Allergic rhinitis   . Allergy   . CAD (coronary artery disease)    Mild CAD by cath 2008  . DJD (degenerative joint disease)    rheumatoid  . DVT (deep venous thrombosis) ([place])   . Dyslipidemia   . History of nuclear stress test    Myoview 5/17:  EF 74%, normal perfusion, low risk  . History of pulmonary embolism   . Hyperlipidemia   . Insomnia   . Osteoporosis   . Pulmonary fibrosis ([place])   . Rheumatoid arthritis ([place])        Review of Systems  Constitutional: Negative for chills, fatigue and fever.  HENT: Negative for congestion, postnasal drip and rhinorrhea.   Respiratory: Positive for cough and chest tightness. Negative for shortness of breath and wheezing.   Cardiovascular: Negative for chest pain, palpitations and leg swelling.       Objective:   Physical Exam  There were no  vitals filed for this visit.RA  ***  PFT July 2017 pulmonary function testing FVC 1.81 L 90% predicted, total lung capacity 3.79 L 72% predicted, DLCO 8.29 32% predicted. July 2018: Ratio 85%, FVC 1.88 L, 95% predicted, total lung capacity 3.37 L 64% predicted, DLCO 7.14 27 percent predicted  6MW July 2017 6 minute walk distance 324 m, O2 saturation nadir 93% on room air     BMET    Component Value Date/Time   NA 140 10/24/2016 1634   K 4.0 10/24/2016 1634   CL 106 10/24/2016 1634   CO2 28 10/24/2016 1634   GLUCOSE 105 (H) 10/24/2016 1634   BUN 13 10/24/2016 1634   CREATININE 0.75 10/24/2016 1634   CREATININE 0.74 11/30/2015 1327   CALCIUM 9.4 10/24/2016 1634   GFRNONAA 78 (L) 06/18/2013 0613   GFRAA >90 06/18/2013 0613   CBC    Component Value Date/Time   WBC 10.3 01/30/2017 1245   RBC 4.77 01/30/2017 1245   HGB 14.3 01/30/2017 1245   HCT 42.8 01/30/2017 1245   PLT 198.0 01/30/2017 1245   MCV 89.9 01/30/2017 1245   MCV 94.8 04/30/2013 1631   MCH 30.1 06/19/2013 0515   MCHC 33.3 01/30/2017 1245   RDW 13.4 01/30/2017 1245   LYMPHSABS 3.6 01/30/2017 1245   MONOABS 0.7 01/30/2017 1245   EOSABS 0.1 01/30/2017 1245   BASOSABS 0.1 01/30/2017 1245  Assessment & Plan:   No diagnosis found.  ***  Updated Medication List Outpatient Encounter Medications as of 08/04/2017  Medication Sig  . ALPRAZolam (XANAX) 0.25 MG tablet Take 1 tablet (0.25 mg total) by mouth at bedtime as needed for anxiety.  [name] aspirin EC 81 MG tablet Take 1 tablet (81 mg total) by mouth daily.  [name] atorvastatin (LIPITOR) 10 MG tablet Take 1 tablet (10 mg total) by mouth daily.  . cetirizine (ZYRTEC) 10 MG tablet Take 5-10 mg by mouth daily as needed for allergies.  . Cholecalciferol (VITAMIN D3) 2000 UNITS capsule Take 1 capsule (2,000 Units total) by mouth daily.  [name] enoxaparin (LOVENOX) 30 MG/0.3ML injection Inject 0.3 mLs (30 mg total) into the skin every 12 (twelve) hours as needed  (travel only).  . fluticasone (FLONASE) 50 MCG/ACT nasal spray Place 2 sprays into both nostrils daily as needed for allergies or rhinitis.  . furosemide (LASIX) 20 MG tablet Take 1 tablet by mouth daily as needed for swelling.  . InFLIXimab (REMICADE IV) Inject into the vein.  . isosorbide mononitrate (IMDUR) 30 MG 24 hr tablet Take 0.5 tablets (15 mg total) daily by mouth.  . latanoprost (XALATAN) 0.005 % ophthalmic solution Place 1 drop into both eyes at bedtime.   . levalbuterol (XOPENEX HFA) 45 MCG/ACT inhaler Inhale 2 puffs into the lungs every 6 (six) hours as needed for wheezing.  [name] Glycol-Propyl Glycol (SYSTANE) 0.4-0.3 % SOLN Apply 1 drop to eye daily as needed (for dry eyes).  . predniSONE (DELTASONE) 10 MG tablet TAKE ONE AND ONE-HALF TABLETS DAILY WITH BREAKFAST  . trimethoprim (TRIMPEX) 100 MG tablet Take 100 mg by mouth daily.   No facility-administered encounter medications on file as of 08/04/2017.

## 2017-08-11 ENCOUNTER — Ambulatory Visit (INDEPENDENT_AMBULATORY_CARE_PROVIDER_SITE_OTHER): Payer: Medicare Other | Admitting: Internal Medicine

## 2017-08-11 ENCOUNTER — Encounter: Payer: Self-pay | Admitting: Internal Medicine

## 2017-08-11 DIAGNOSIS — R21 Rash and other nonspecific skin eruption: Secondary | ICD-10-CM | POA: Diagnosis not present

## 2017-08-11 MED ORDER — PREDNISONE 20 MG PO TABS: 40.0000 mg | ORAL_TABLET | Freq: Every day | ORAL | 0 refills | Status: DC

## 2017-08-11 NOTE — Progress Notes (Signed)
   Subjective:    Patient ID: [name] [name], female    DOB: 16-Feb-1931, 84 y.o.   MRN: [id number]  HPI The patient is an 84 YO female coming in for rash on her arms. They are crusty some now. Started about 1 week ago. She denies changing lotions or cream or clothes. She denies new detergent or liquid. She is having mild itching on the area with rash. No itching on palms or soles. Itching on the forearms mostly. Denies pain in the area. No fevers or chills. She tried using alcohol on them initially and this did not help. She then got some cortisone cream which did not help much.   Review of Systems  Constitutional: Negative.   Respiratory: Negative for cough, chest tightness and shortness of breath.   Cardiovascular: Negative for chest pain, palpitations and leg swelling.  Gastrointestinal: Negative for abdominal distention, abdominal pain, constipation, diarrhea, nausea and vomiting.  Musculoskeletal: Negative.   Skin: Positive for rash.  Neurological: Negative.       Objective:   Physical Exam  Constitutional: She is oriented to person, place, and time. She appears well-developed and well-nourished.  HENT:  Head: Normocephalic and atraumatic.  Eyes: EOM are normal.  Neck: Normal range of motion.  Cardiovascular: Normal rate and regular rhythm.  Pulmonary/Chest: Effort normal and breath sounds normal. No respiratory distress. She has no wheezes. She has no rales.  Oxygen   Abdominal: Soft. Bowel sounds are normal. She exhibits no distension. There is no tenderness. There is no rebound.  Musculoskeletal: She exhibits no edema.  Neurological: She is alert and oriented to person, place, and time. Coordination normal.  Skin: Skin is warm and dry. Rash noted.  Some small red macular patches on the forearms without much stigmata of scratching.   Psychiatric: She has a normal mood and affect.   Vitals:   08/11/17 1020  BP: 110/70  Pulse: 95  Temp: 97.6 F (36.4 C)  TempSrc: Oral    SpO2: 96%  Weight: 143 lb (64.9 kg)  Height: 5\' 5"  (1.651 m)      Assessment & Plan:

## 2017-08-11 NOTE — Patient Instructions (Addendum)
We have sent in some higher dose prednisone to calm this down. Take 2 pills daily for 4 days then go back to the usual dosing.  It is okay to take benadryl for itching.

## 2017-08-11 NOTE — Assessment & Plan Note (Signed)
Will increase prednisone to 40 mg daily for 4 days to help resolve. Can use benadryl over the counter. Advised that alcohol dries out the skin and may have worsened the rash. Avoid scratching if able.

## 2017-08-14 ENCOUNTER — Other Ambulatory Visit: Payer: Self-pay

## 2017-08-14 MED ORDER — PREDNISONE 10 MG PO TABS: ORAL_TABLET | ORAL | 1 refills | Status: DC

## 2017-08-18 ENCOUNTER — Emergency Department (HOSPITAL_COMMUNITY)
Admission: EM | Admit: 2017-08-18 | Discharge: 2017-08-19 | Disposition: A | Discharge status: Home / Self Care | Payer: Medicare Other | Attending: Emergency Medicine | Admitting: Emergency Medicine

## 2017-08-18 ENCOUNTER — Ambulatory Visit: Payer: Self-pay

## 2017-08-18 ENCOUNTER — Telehealth: Payer: Self-pay | Admitting: Pulmonary Disease

## 2017-08-18 ENCOUNTER — Encounter (HOSPITAL_COMMUNITY): Payer: Self-pay | Admitting: Radiology

## 2017-08-18 ENCOUNTER — Emergency Department (HOSPITAL_COMMUNITY): Payer: Medicare Other

## 2017-08-18 ENCOUNTER — Other Ambulatory Visit: Payer: Self-pay

## 2017-08-18 DIAGNOSIS — R410 Disorientation, unspecified: Secondary | ICD-10-CM | POA: Diagnosis not present

## 2017-08-18 DIAGNOSIS — Z79899 Other long term (current) drug therapy: Secondary | ICD-10-CM | POA: Diagnosis not present

## 2017-08-18 DIAGNOSIS — R4182 Altered mental status, unspecified: Secondary | ICD-10-CM | POA: Diagnosis present

## 2017-08-18 DIAGNOSIS — Z96653 Presence of artificial knee joint, bilateral: Secondary | ICD-10-CM | POA: Diagnosis not present

## 2017-08-18 DIAGNOSIS — Z7982 Long term (current) use of aspirin: Secondary | ICD-10-CM | POA: Insufficient documentation

## 2017-08-18 DIAGNOSIS — R509 Fever, unspecified: Secondary | ICD-10-CM | POA: Diagnosis not present

## 2017-08-18 DIAGNOSIS — I2510 Atherosclerotic heart disease of native coronary artery without angina pectoris: Secondary | ICD-10-CM | POA: Insufficient documentation

## 2017-08-18 DIAGNOSIS — J101 Influenza due to other identified influenza virus with other respiratory manifestations: Secondary | ICD-10-CM | POA: Diagnosis not present

## 2017-08-18 DIAGNOSIS — Z87891 Personal history of nicotine dependence: Secondary | ICD-10-CM | POA: Diagnosis not present

## 2017-08-18 LAB — CBC WITH DIFFERENTIAL/PLATELET
BASOS ABS: 0 10*3/uL (ref 0.0–0.1)
BASOS PCT: 0 %
EOS ABS: 0 10*3/uL (ref 0.0–0.7)
EOS PCT: 0 %
HCT: 40.6 % (ref 36.0–46.0)
Hemoglobin: 13.2 g/dL (ref 12.0–15.0)
LYMPHS PCT: 32 %
Lymphs Abs: 3.8 10*3/uL (ref 0.7–4.0)
MCH: 30.9 pg (ref 26.0–34.0)
MCHC: 32.5 g/dL (ref 30.0–36.0)
MCV: 95.1 fL (ref 78.0–100.0)
MONO ABS: 1.4 10*3/uL — AB (ref 0.1–1.0)
Monocytes Relative: 12 %
Neutro Abs: 6.6 10*3/uL (ref 1.7–7.7)
Neutrophils Relative %: 56 %
PLATELETS: 156 10*3/uL (ref 150–400)
RBC: 4.27 MIL/uL (ref 3.87–5.11)
RDW: 14.4 % (ref 11.5–15.5)
WBC: 11.8 10*3/uL — AB (ref 4.0–10.5)

## 2017-08-18 LAB — COMPREHENSIVE METABOLIC PANEL
ALBUMIN: 3.1 g/dL — AB (ref 3.5–5.0)
ALK PHOS: 56 U/L (ref 38–126)
ALT: 22 U/L (ref 14–54)
ANION GAP: 12 (ref 5–15)
AST: 26 U/L (ref 15–41)
BILIRUBIN TOTAL: 0.7 mg/dL (ref 0.3–1.2)
BUN: 10 mg/dL (ref 6–20)
CALCIUM: 8.6 mg/dL — AB (ref 8.9–10.3)
CO2: 24 mmol/L (ref 22–32)
Chloride: 99 mmol/L — ABNORMAL LOW (ref 101–111)
Creatinine, Ser: 1 mg/dL (ref 0.44–1.00)
GFR calc Af Amer: 58 mL/min — ABNORMAL LOW (ref >60)
GFR, EST NON AFRICAN AMERICAN: 50 mL/min — AB (ref >60)
Glucose, Bld: 91 mg/dL (ref 65–99)
POTASSIUM: 3.3 mmol/L — AB (ref 3.5–5.1)
Sodium: 135 mmol/L (ref 135–145)
TOTAL PROTEIN: 6.3 g/dL — AB (ref 6.5–8.1)

## 2017-08-18 LAB — I-STAT TROPONIN, ED: TROPONIN I, POC: 0 ng/mL (ref 0.00–0.08)

## 2017-08-18 LAB — I-STAT CG4 LACTIC ACID, ED: LACTIC ACID, VENOUS: 1.21 mmol/L (ref 0.5–1.9)

## 2017-08-18 LAB — INFLUENZA PANEL BY PCR (TYPE A & B)
INFLBPCR: NEGATIVE
Influenza A By PCR: POSITIVE — AB

## 2017-08-18 MED ORDER — PREDNISONE 20 MG PO TABS: 20.0000 mg | ORAL_TABLET | Freq: Every day | ORAL | 0 refills | Status: DC

## 2017-08-18 MED ORDER — OSELTAMIVIR PHOSPHATE 75 MG PO CAPS
75.0000 mg | ORAL_CAPSULE | Freq: Once | ORAL | Status: AC
Start: 2017-08-18 — End: 2017-08-18
  Administered 2017-08-18: 75 mg via ORAL
  Filled 2017-08-18: qty 1

## 2017-08-18 MED ORDER — DOXYCYCLINE HYCLATE 100 MG PO TABS: 100.0000 mg | ORAL_TABLET | Freq: Two times a day (BID) | ORAL | 0 refills | Status: DC

## 2017-08-18 MED ORDER — SODIUM CHLORIDE 0.9 % IV BOLUS (SEPSIS)
500.0000 mL | Freq: Once | INTRAVENOUS | Status: AC
  Administered 2017-08-18: 500 mL via INTRAVENOUS

## 2017-08-18 NOTE — Telephone Encounter (Signed)
Pt c/o prod cough with yellow mucus, fatigue, chills, muscle aches.  S.s present X1 day.   Denies sinus congestion, fever, chest pains.  Pt has not taken anything to help with s/s.  Requesting recs. Pt uses [facility] on [address].  BQ please advise.  Thanks!

## 2017-08-18 NOTE — ED Provider Notes (Signed)
[facility] EMERGENCY DEPARTMENT Provider Note   CSN: [id number] Arrival date & time: 08/18/17  2118     History   Chief Complaint Chief Complaint  Patient presents with  . Altered Mental Status    HPI [name] [name] is a 84 y.o. female.  HPI Patient has had some mild symptoms of cough for about 3 days.  She experienced some chills yesterday and today.  Today she had a temperature measured up to 101.  Today she developed confusion.  She is here with her nephew.  Patient recognized that she seemed confused.  She reports she was having difficulty completing her thoughts were her sentences.  Her nephew reports that several people spoke to her through the course of the day and she did seem to have trouble completing thoughts.  Reports this is very unusual for her.  Patient is very talkative typically and active.  Patient does note some sensation of numbness in the right arm today.  No known dysfunction.  She does endorse urinary frequency but not pain or burning.  Patient does have pulmonary fibrosis but endorses she has had increased cough with sputum for a couple of days. Past Medical History:  Diagnosis Date  . Allergic rhinitis   . Allergy   . CAD (coronary artery disease)    Mild CAD by cath 2008  . DJD (degenerative joint disease)    rheumatoid  . DVT (deep venous thrombosis) ([place])   . Dyslipidemia   . History of nuclear stress test    Myoview 5/17:  EF 74%, normal perfusion, low risk  . History of pulmonary embolism   . Hyperlipidemia   . Insomnia   . Osteoporosis   . Pulmonary fibrosis ([place])   . Rheumatoid arthritis [facility])     Patient Active Problem List   Diagnosis Date Noted  . Rash 08/11/2017  . Chronic respiratory failure with hypoxia ([place]) 03/30/2017  . Angular cheilitis 03/08/2017  . Leg pain 10/25/2016  . Back pain 08/05/2016  . RA (rheumatoid arthritis) ([place]) 03/31/2015  . Allergic rhinitis   . Varicose vein 09/11/2014  . Chest pain  02/27/2012  . CAD (coronary artery disease) 02/27/2012  . Postinflammatory pulmonary fibrosis ([place]) 06/30/2008  . CONSTIPATION 01/03/2008  . Dyslipidemia 06/16/2007  . Personal history of DVT (deep vein thrombosis) 06/16/2007    Past Surgical History:  Procedure Laterality Date  . ABDOMINAL HYSTERECTOMY  1974  . APPENDECTOMY  1959  . CATARACT EXTRACTION    . LEFT HEART CATHETERIZATION WITH CORONARY ANGIOGRAM N/A 06/18/2013   Procedure: LEFT HEART CATHETERIZATION WITH CORONARY ANGIOGRAM;  Surgeon: Peter M Martinique, MD;  Location: [facility] CATH LAB;  Service: Cardiovascular;  Laterality: N/A;  . TONSILLECTOMY  1941, 1951  . TOTAL KNEE ARTHROPLASTY  1997, 2007   Bilateral    OB History    Gravida Para Term Preterm AB Living   4 2 2      0   SAB TAB Ectopic Multiple Live Births                   Home Medications    Prior to Admission medications   Medication Sig Start Date End Date Taking? Authorizing Provider  acetaminophen (TYLENOL) 500 MG tablet Take 2 tablets (1,000 mg total) by mouth every 6 (six) hours as needed. 08/19/17   Charlesetta Shanks, MD  ALPRAZolam Duanne Moron) 0.25 MG tablet Take 1 tablet (0.25 mg total) by mouth at bedtime as needed for anxiety. 10/24/16   Sharlet Salina,  Real Cons, MD  aspirin EC 81 MG tablet Take 1 tablet (81 mg total) by mouth daily. 11/30/15   Richardson Dopp T, PA-C  atorvastatin (LIPITOR) 10 MG tablet Take 1 tablet (10 mg total) by mouth daily. 12/27/16   Burnell Blanks, MD  cetirizine (ZYRTEC) 10 MG tablet Take 5-10 mg by mouth daily as needed for allergies.    [provider]  Cholecalciferol (VITAMIN D3) 2000 UNITS capsule Take 1 capsule (2,000 Units total) by mouth daily. 03/31/15   Plotnikov, Evie Lacks, MD  doxycycline (VIBRA-TABS) 100 MG tablet Take 1 tablet (100 mg total) by mouth 2 (two) times daily. 08/18/17   Juanito Doom, MD  enoxaparin (LOVENOX) 30 MG/0.3ML injection Inject 0.3 mLs (30 mg total) into the skin every 12 (twelve) hours as  needed (travel only). 10/24/16   Hoyt Koch, MD  fluticasone Asencion Islam) 50 MCG/ACT nasal spray Place 2 sprays into both nostrils daily as needed for allergies or rhinitis. 03/17/15   Hoyt Koch, MD  furosemide (LASIX) 20 MG tablet Take 1 tablet by mouth daily as needed for swelling. 06/15/17   Burnell Blanks, MD  InFLIXimab (REMICADE IV) Inject into the vein.    [provider]  isosorbide mononitrate (IMDUR) 30 MG 24 hr tablet Take 0.5 tablets (15 mg total) daily by mouth. 05/29/17 08/27/17  Imogene Burn, PA-C  latanoprost (XALATAN) 0.005 % ophthalmic solution Place 1 drop into both eyes at bedtime.  09/06/14   [provider]  levalbuterol Penne Lash HFA) 45 MCG/ACT inhaler Inhale 2 puffs into the lungs every 6 (six) hours as needed for wheezing. 06/05/17   Magdalen Spatz, NP  oseltamivir (TAMIFLU) 75 MG capsule Take 1 capsule (75 mg total) by mouth every 12 (twelve) hours. 08/19/17   Charlesetta Shanks, MD  Polyethyl Glycol-Propyl Glycol (SYSTANE) 0.4-0.3 % SOLN Apply 1 drop to eye daily as needed (for dry eyes).    [provider]  predniSONE (DELTASONE) 10 MG tablet TAKE ONE AND ONE-HALF TABLETS DAILY WITH BREAKFAST 08/14/17   Juanito Doom, MD  predniSONE (DELTASONE) 20 MG tablet Take 1 tablet (20 mg total) by mouth daily with breakfast. 08/18/17   Juanito Doom, MD  trimethoprim (TRIMPEX) 100 MG tablet Take 100 mg by mouth daily.    [provider]    Family History Family History  Problem Relation Age of Onset  . Kidney disease Daughter 5  . Cancer Daughter   . Heart attack Father 11  . Alzheimer's disease Sister   . Heart disease Sister   . Alzheimer's disease Sister     Social History Social History   Tobacco Use  . Smoking status: Former Smoker    Packs/day: 0.30    Years: 20.00    Pack years: 6.00    Types: Cigarettes    Last attempt to quit: 07/11/1973    Years since quitting: 44.1  . Smokeless tobacco:  Never Used  Substance Use Topics  . Alcohol use: No    Alcohol/week: 0.0 oz  . Drug use: No     Allergies   Crestor [rosuvastatin]; Lactose intolerance (gi); Penicillins; and Niacin   Review of Systems Review of Systems 10 Systems reviewed and are negative for acute change except as noted in the HPI.  Physical Exam Updated Vital Signs BP 100/60   Pulse 74   Temp 99.1 F (37.3 C) (Oral)   Resp 16   Ht 5\' 4"  (1.626 m)   Wt 63.5  kg (140 lb)   SpO2 97%   BMI 24.03 kg/m   Physical Exam  Constitutional: She is oriented to person, place, and time. She appears well-developed and well-nourished. No distress.  HENT:  Head: Normocephalic and atraumatic.  Mouth/Throat: Oropharynx is clear and moist.  Eyes: Conjunctivae and EOM are normal.  Neck: Neck supple.  Cardiovascular: Normal rate, regular rhythm and normal heart sounds.  No murmur heard. Pulmonary/Chest: Effort normal. No respiratory distress. She has rales.  Crackles at the lung bases.  Good airflow bilaterally.  No respiratory distress.  Occasional moist cough.  Abdominal: Soft. She exhibits no distension. There is no tenderness. There is no guarding.  Musculoskeletal: Normal range of motion. She exhibits edema. She exhibits no tenderness.  Trace edema.  No cellulitis  Neurological: She is alert and oriented to person, place, and time. No cranial nerve deficit. She exhibits normal muscle tone. Coordination normal.  Skin: Skin is warm and dry.  Psychiatric: She has a normal mood and affect.  Nursing note and vitals reviewed.    ED Treatments / Results  Labs (all labs ordered are listed, but only abnormal results are displayed) Labs Reviewed  COMPREHENSIVE METABOLIC PANEL - Abnormal; Notable for the following components:      Result Value   Potassium 3.3 (*)    Chloride 99 (*)    Calcium 8.6 (*)    Total Protein 6.3 (*)    Albumin 3.1 (*)    GFR calc non Af Amer 50 (*)    GFR calc Af Amer 58 (*)    All other  components within normal limits  CBC WITH DIFFERENTIAL/PLATELET - Abnormal; Notable for the following components:   WBC 11.8 (*)    Monocytes Absolute 1.4 (*)    All other components within normal limits  INFLUENZA PANEL BY PCR (TYPE A & B) - Abnormal; Notable for the following components:   Influenza A By PCR POSITIVE (*)    All other components within normal limits  URINE CULTURE  CULTURE, BLOOD (ROUTINE X 2)  CULTURE, BLOOD (ROUTINE X 2)  URINALYSIS, ROUTINE W REFLEX MICROSCOPIC  I-STAT CG4 LACTIC ACID, ED  I-STAT TROPONIN, ED  I-STAT CG4 LACTIC ACID, ED    EKG  EKG Interpretation None       Radiology Dg Chest 2 View  Result Date: 08/18/2017 CLINICAL DATA:  Acute onset of fever. EXAM: CHEST  2 VIEW COMPARISON:  Chest radiograph performed 02/02/2017 FINDINGS: The lungs are well-aerated. Chronic interstitial opacities are stable in appearance and reflect the patient's chronic interstitial lung disease. No pleural effusion or pneumothorax is seen. The heart is normal in size; the mediastinal contour is within normal limits. No acute osseous abnormalities are seen. IMPRESSION: Chronic interstitial lung disease is stable in appearance. No superimposed focal airspace consolidation seen. Electronically Signed   By: Garald Balding M.D.   On: 08/18/2017 22:37   Ct Head Wo Contrast  Result Date: 08/18/2017 CLINICAL DATA:  Confusion and difficulty speaking EXAM: CT HEAD WITHOUT CONTRAST TECHNIQUE: Contiguous axial images were obtained from the base of the skull through the vertex without intravenous contrast. COMPARISON:  CT brain 12/28/2013 FINDINGS: Brain: No acute territorial infarction, hemorrhage or intracranial mass is visualized. Moderate atrophy. Minimal small vessel ischemic changes of the white matter. Stable ventricle size. Vascular: Carotid vascular calcifications. Skull: No fracture. Sinuses/Orbits: Fluid in the left sphenoid sinus. Mucosal thickening in the ethmoid sinuses and  frontal sinuses. Mild mucosal thickening in the maxillary sinuses Other: None IMPRESSION:  1. No CT evidence for acute intracranial abnormality. 2. Atrophy and minimal small vessel ischemic changes of the white matter. Electronically Signed   By: Donavan Foil M.D.   On: 08/18/2017 22:31    Procedures Procedures (including critical care time)  Medications Ordered in ED Medications  sodium chloride 0.9 % bolus 500 mL (500 mLs Intravenous New Bag/Given 08/18/17 2349)  oseltamivir (TAMIFLU) capsule 75 mg (75 mg Oral Given 08/18/17 2349)     Initial Impression / Assessment and Plan / ED Course  I have reviewed the triage vital signs and the nursing notes.  Pertinent labs & imaging results that were available during my care of the patient were reviewed by me and considered in my medical decision making (see chart for details).     Final Clinical Impressions(s) / ED Diagnoses   Final diagnoses:  Influenza A  She is alert and appropriate.  Vital signs are stable.  No respiratory distress.  Patient does have assistance at home and family caregivers.  At this time I feel she is stable for home treatment of influenza.  Return precautions are reviewed.  ED Discharge Orders        Ordered    oseltamivir (TAMIFLU) 75 MG capsule  Every 12 hours     08/19/17 0042    acetaminophen (TYLENOL) 500 MG tablet  Every 6 hours PRN     08/19/17 0042       Charlesetta Shanks, MD 08/19/17 [phone]

## 2017-08-18 NOTE — Telephone Encounter (Signed)
Pt with h/o pulmonary fibrosis and oxygen dependant calling with c/o productive cough. Pt is coughing up yellow to gray phlegm. Pt has runny nose and post nasal drip that is affecting her sleep at night. No fever. Increased SOB with exertion. Pt has h/o heart, lung, leg clots. Appt made with Dr Jonni Sanger 08/19/17 @ 0930.   Reason for Disposition . [1] Known COPD or other severe lung disease (i.e., bronchiectasis, cystic fibrosis, lung surgery) AND [2] worsening symptoms (i.e., increased sputum purulence or amount, increased breathing difficulty    Pulmonary fibrosis  Answer Assessment - Initial Assessment Questions 1. ONSET: "When did the cough begin?"      2 days 2. SEVERITY: "How bad is the cough today?"      Cough is better this after noon last night couldn't sleep 3. RESPIRATORY DISTRESS: "Describe your breathing."      Is okay as long as O2 on - when more active needs more O2, no SOB- SOB with exertion 4. FEVER: "Do you have a fever?" If so, ask: "What is your temperature, how was it measured, and when did it start?"     No fever 5. SPUTUM: "Describe the color of your sputum" (clear, white, yellow, green) White at first then yellow to gray 6. HEMOPTYSIS: "Are you coughing up any blood?" If so ask: "How much?" (flecks, streaks, tablespoons, etc.)     no 7. CARDIAC HISTORY: "Do you have any history of heart disease?" (e.g., heart attack, congestive heart failure)      Has unknown cardiac dx 8. LUNG HISTORY: "Do you have any history of lung disease?"  (e.g., pulmonary embolus, asthma, emphysema)     Pulmonary fibrosis 9. PE RISK FACTORS: "Do you have a history of blood clots?" (or: recent major surgery, recent prolonged travel, bedridden )     Long h/o clots, heart, lung, leg blood clots 10. OTHER SYMPTOMS: "Do you have any other symptoms?" (e.g., runny nose, wheezing, chest pain)       Runny nose, post nasal drip 11. PREGNANCY: "Is there any chance you are pregnant?" "When was your last  menstrual period?"       n/a 12. TRAVEL: "Have you traveled out of the country in the last month?" (e.g., travel history, exposures)       no  Protocols used: [place]

## 2017-08-18 NOTE — Telephone Encounter (Signed)
Called pt letting her know recs per BQ.  Verified pt's pharmacy with her and sent script of pred 20mg  and doxy 100mg  to preferred pharmacy.  Stated to pt to call us if no improvement.  Pt expressed understanding.  Nothing further needed at this current time.

## 2017-08-18 NOTE — Telephone Encounter (Signed)
Prednisone 20mg  daily x 5 days Doxycycline 100mg  po bid x 5 days Call if no ipmrovement

## 2017-08-18 NOTE — ED Triage Notes (Signed)
Patient called EMS for "difficulty getting words out". Also c/o fever earlier and took Tylenol 1000 mg @ 19:30ish. A&O x4. Her primary care physician advised pt to go to ER for follow up.

## 2017-08-19 ENCOUNTER — Ambulatory Visit: Payer: Medicare Other | Admitting: Family Medicine

## 2017-08-19 DIAGNOSIS — J101 Influenza due to other identified influenza virus with other respiratory manifestations: Secondary | ICD-10-CM | POA: Diagnosis not present

## 2017-08-19 LAB — I-STAT CG4 LACTIC ACID, ED: LACTIC ACID, VENOUS: 0.74 mmol/L (ref 0.5–1.9)

## 2017-08-19 MED ORDER — ACETAMINOPHEN 500 MG PO TABS: 1000.0000 mg | ORAL_TABLET | Freq: Four times a day (QID) | ORAL | 0 refills | Status: DC | PRN

## 2017-08-19 MED ORDER — OSELTAMIVIR PHOSPHATE 75 MG PO CAPS: 75.0000 mg | ORAL_CAPSULE | Freq: Two times a day (BID) | ORAL | 0 refills | Status: DC

## 2017-08-23 LAB — CULTURE, BLOOD (ROUTINE X 2)
Culture: NO GROWTH
Culture: NO GROWTH
SPECIAL REQUESTS: ADEQUATE
Special Requests: ADEQUATE

## 2017-08-24 ENCOUNTER — Encounter: Payer: Self-pay | Admitting: Internal Medicine

## 2017-08-24 ENCOUNTER — Ambulatory Visit (INDEPENDENT_AMBULATORY_CARE_PROVIDER_SITE_OTHER): Payer: Medicare Other | Admitting: Internal Medicine

## 2017-08-24 DIAGNOSIS — J301 Allergic rhinitis due to pollen: Secondary | ICD-10-CM

## 2017-08-24 DIAGNOSIS — J111 Influenza due to unidentified influenza virus with other respiratory manifestations: Secondary | ICD-10-CM | POA: Diagnosis not present

## 2017-08-24 DIAGNOSIS — J8410 Pulmonary fibrosis, unspecified: Secondary | ICD-10-CM | POA: Diagnosis not present

## 2017-08-24 DIAGNOSIS — J9611 Chronic respiratory failure with hypoxia: Secondary | ICD-10-CM | POA: Diagnosis not present

## 2017-08-24 MED ORDER — SACCHAROMYCES BOULARDII 250 MG PO CAPS: 250.0000 mg | ORAL_CAPSULE | Freq: Two times a day (BID) | ORAL | 0 refills | Status: DC

## 2017-08-24 MED ORDER — ONDANSETRON HCL 4 MG PO TABS: 4.0000 mg | ORAL_TABLET | Freq: Three times a day (TID) | ORAL | 0 refills | Status: DC | PRN

## 2017-08-24 NOTE — Assessment & Plan Note (Signed)
Finished tamiflu and recovering. She is having diarrhea likely induced by medications given to her. She will take probiotic and monitor symptoms. If no resolution needs C dif test.

## 2017-08-24 NOTE — Assessment & Plan Note (Signed)
Feel that recent symptoms represent flu and not flare of her pulmonary fibrosis. She did complete steroid burst and doxycycline. She now has likely antibiotic induced diarrhea and prescribed probiotic today for her.

## 2017-08-24 NOTE — Assessment & Plan Note (Signed)
Continues on her oxygen and levels normal today. She is still on prednisone 10 mg daily.

## 2017-08-24 NOTE — Patient Instructions (Signed)
Start taking the zyrtec daily to help the drainage.   We have sent in a probiotic to take for a couple of weeks to get the bowels back to normal.  We have sent in a nausea medicine zofran which you can use if needed.

## 2017-08-24 NOTE — Assessment & Plan Note (Signed)
Reminded to start taking zyrtec for her drainage and she will do this.

## 2017-08-24 NOTE — Progress Notes (Signed)
   Subjective:    Patient ID: [name] [name], female    DOB: 1930/11/07, 84 y.o.   MRN: [id number]  HPI The patient is an 84 YO female coming in for ER follow up (seen for flu positive and given tamiflu). She had called her pulmonary provider that same day and given increase in prednisone and doxycycline for symptoms. She took the increased prednisone for 5 days (normally on 10 mg daily and took 20 mg daily for 5 days then dropped back down). She finished 5 days of doxycycline and on the last day she started having diarrhea. This was two days ago. She went 2-3 times yesterday stimulated by after eating. She denies watery stools. She denies fevers anymore. She is still having coughing. She denies SOB unless she is trying to go really fast. Still using oxygen at her same rate of flow. She denies body aches or headaches. She does have significant drainage and is not taking her zyrtec right now.   Review of Systems  Constitutional: Positive for activity change and appetite change. Negative for chills, fatigue, fever and unexpected weight change.  HENT: Positive for congestion, postnasal drip and rhinorrhea. Negative for ear discharge, ear pain, sinus pressure, sinus pain, sneezing, sore throat, tinnitus, trouble swallowing and voice change.   Eyes: Negative.   Respiratory: Positive for cough and shortness of breath. Negative for chest tightness and wheezing.   Cardiovascular: Negative.   Gastrointestinal: Negative.   Musculoskeletal: Negative for myalgias.  Skin: Negative.   Neurological: Negative.   Psychiatric/Behavioral: Negative.       Objective:   Physical Exam  Constitutional: She is oriented to person, place, and time. She appears well-developed and well-nourished. No distress.  HENT:  Head: Normocephalic and atraumatic.  Oropharynx with redness and clear drainage, nose with swollen turbinates, TMs normal bilaterally  Eyes: EOM are normal.  Neck: Normal range of motion. No thyromegaly  present.  Cardiovascular: Normal rate and regular rhythm.  Pulmonary/Chest: Effort normal and breath sounds normal. No respiratory distress. She has no wheezes. She has no rales. She exhibits no tenderness.  Some scattered rhonchi, no focus, wearing oxygen and no distress talking in normal sentences  Abdominal: Soft. She exhibits no distension. There is no tenderness. There is no rebound.  Lymphadenopathy:    She has no cervical adenopathy.  Neurological: She is alert and oriented to person, place, and time.  Skin: Skin is warm and dry.  Psychiatric: She has a normal mood and affect.   Vitals:   08/24/17 1039  BP: 110/68  Pulse: 83  Temp: 97.7 F (36.5 C)  TempSrc: Oral  SpO2: 95%  Weight: 139 lb (63 kg)  Height: 5\' 4"  (1.626 m)      Assessment & Plan:

## 2017-09-04 DIAGNOSIS — M0579 Rheumatoid arthritis with rheumatoid factor of multiple sites without organ or systems involvement: Secondary | ICD-10-CM | POA: Diagnosis not present

## 2017-09-07 ENCOUNTER — Ambulatory Visit (INDEPENDENT_AMBULATORY_CARE_PROVIDER_SITE_OTHER): Payer: Medicare Other | Admitting: Pulmonary Disease

## 2017-09-07 ENCOUNTER — Encounter: Payer: Self-pay | Admitting: Pulmonary Disease

## 2017-09-07 VITALS — BP 130/62 | HR 99 | Ht 64.0 in | Wt 138.0 lb

## 2017-09-07 DIAGNOSIS — Z86718 Personal history of other venous thrombosis and embolism: Secondary | ICD-10-CM

## 2017-09-07 DIAGNOSIS — M069 Rheumatoid arthritis, unspecified: Secondary | ICD-10-CM

## 2017-09-07 DIAGNOSIS — R062 Wheezing: Secondary | ICD-10-CM | POA: Diagnosis not present

## 2017-09-07 DIAGNOSIS — J8410 Pulmonary fibrosis, unspecified: Secondary | ICD-10-CM | POA: Diagnosis not present

## 2017-09-07 DIAGNOSIS — J9611 Chronic respiratory failure with hypoxia: Secondary | ICD-10-CM | POA: Diagnosis not present

## 2017-09-07 NOTE — Patient Instructions (Signed)
Chronic respiratory failure with hypoxemia: Continue 2 L of oxygen continuously  Rheumatoid arthritis associated interstitial lung disease: Continue prednisone daily We will arrange for a lung function test  Wheezing on physical exam: I think you should take Symbicort 1 puff twice a day  History of blood clot: Use Lovenox only if traveling for long distances (greater than 6 hours of sitting continuously)  I will see you back in 4-6 weeks

## 2017-09-07 NOTE — Progress Notes (Signed)
Subjective:    Patient ID: [name] [name], female    DOB: 25-Sep-1930, 84 y.o.   MRN: [id number]  Synopsis: This is a very pleasant female who is diagnosed with interstitial lung disease in her mid 70s after evaluation at [facility] and [facility]. She was found to have a UIP pattern on CT scan and serology testing revealed a very high rheumatoid factor as well as anti-CCP antibody. She was followed by Dr. Elgie Collard at [facility] who felt like she had rheumatoid arthritis associated interstitial lung disease. He never treated her with immunosuppressive therapy. From 2000 07/09/2013 she's continued to follow in both [place] and due to her lung function has remained stable during that time. She has not developed new shortness of breath. She has had repeated episodes of pulmonary emboli. The most recent was in the summer of 2014 when she developed her fourth pulmonary embolism after returning from a trip to [place]. In 2018 she had worsening so in addition to infliximab she was treated with prednisone daily.  During that year she also started using oxygen 2 L continuously.   HPI Chief Complaint  Patient presents with  . Follow-up    pt c/o sob with exertion, at baseline.     It has been a year since I have seen [name].  She has been put on daily prednisone in the last year.  Apparently there is a therapeutic trial of Symbicort. > the prednisone and the infliximab seem to help her control the pain, it is much better now > the symbicort helped when she took it  She is now on oxygen > she says it helps her when she is wearing it > she only feels worse if she keeps if off for "long enough" like when she is getting ready in the mornings (15-20) > she wears it at rest as well > she sleeps with it  In the last year she has been having some shortness of breath.  Past Medical History:  Diagnosis Date  . Allergic rhinitis   . Allergy   . CAD (coronary  artery disease)    Mild CAD by cath 2008  . DJD (degenerative joint disease)    rheumatoid  . DVT (deep venous thrombosis) ([place])   . Dyslipidemia   . History of nuclear stress test    Myoview 5/17:  EF 74%, normal perfusion, low risk  . History of pulmonary embolism   . Hyperlipidemia   . Insomnia   . Osteoporosis   . Pulmonary fibrosis ([place])   . Rheumatoid arthritis ([place])        Review of Systems  Constitutional: Negative for chills, fatigue and fever.  HENT: Negative for congestion, postnasal drip and rhinorrhea.   Respiratory: Positive for cough and chest tightness. Negative for shortness of breath and wheezing.   Cardiovascular: Negative for chest pain, palpitations and leg swelling.       Objective:   Physical Exam  Vitals:   09/07/17 1423  BP: 130/62  Pulse: 99  SpO2: 94%  Weight: 138 lb (62.6 kg)  Height: 5\' 4"  (1.626 m)  RA  Gen: chronically ill appearing HENT: OP clear, TM's clear, neck supple PULM: Wheezing throughout B, normal percussion CV: RRR, no mgr, trace edema GI: BS+, soft, nontender Derm: no cyanosis or rash Psyche: normal mood and affect   July 2017 pulmonary function testing FVC 1.81 L 90% predicted, total lung capacity 3.79 L 72% predicted, DLCO 8.29 32%  predicted. July 2017 6 minute walk distance 324 m, O2 saturation nadir 93% on room air  Multiple records from her visits with our nurse practitioner reviewed in the last year where she was treated with both Symbicort and prednisone for wheezing and shortness of breath.  BMET    Component Value Date/Time   NA 135 08/18/2017 2208   K 3.3 (L) 08/18/2017 2208   CL 99 (L) 08/18/2017 2208   CO2 24 08/18/2017 2208   GLUCOSE 91 08/18/2017 2208   BUN 10 08/18/2017 2208   CREATININE 1.00 08/18/2017 2208   CREATININE 0.74 11/30/2015 1327   CALCIUM 8.6 (L) 08/18/2017 2208   GFRNONAA 50 (L) 08/18/2017 2208   GFRAA 58 (L) 08/18/2017 2208        Assessment & Plan:   Postinflammatory  pulmonary fibrosis ([place]) - Plan: Pulmonary function test  Chronic respiratory failure with hypoxia (HCC)  Personal history of DVT (deep vein thrombosis)  Rheumatoid arthritis involving multiple sites, unspecified rheumatoid factor presence ([place])  Wheezing  Discussion: In the last year [place] has gone on oxygen and she is taking prednisone daily for her rheumatoid arthritis.  It concerns me that her rheumatoid associated lung disease may be worsening.  In addition she has wheezing on exam which is new compared to my previous exams at least a year ago.  I like to get a lung function test to see if the lung disease has progressed.  If her PFTs show worsening disease then we need to get a high-resolution CT scan of the chest.  If there is evidence of worsening rheumatoid arthritis associated lung disease then we may need to consider changing to something like CellCept or Imuran.  Chronic respiratory failure with hypoxemia: Continue 2 L of oxygen continuously  Rheumatoid arthritis associated interstitial lung disease: Continue prednisone daily We will arrange for a lung function test  Wheezing on physical exam: I think you should take Symbicort 1 puff twice a day  History of blood clot: Use Lovenox only if traveling for long distances (greater than 6 hours of sitting continuously)  I will see you back in 4-6 weeks    Current Outpatient Medications:  .  acetaminophen (TYLENOL) 500 MG tablet, Take 2 tablets (1,000 mg total) by mouth every 6 (six) hours as needed., Disp: 30 tablet, Rfl: 0 .  ALPRAZolam (XANAX) 0.25 MG tablet, Take 1 tablet (0.25 mg total) by mouth at bedtime as needed for anxiety., Disp: 15 tablet, Rfl: 0 .  aspirin EC 81 MG tablet, Take 1 tablet (81 mg total) by mouth daily., Disp: , Rfl:  .  atorvastatin (LIPITOR) 10 MG tablet, Take 1 tablet (10 mg total) by mouth daily., Disp: 90 tablet, Rfl: 3 .  cetirizine (ZYRTEC) 10 MG tablet, Take 5-10 mg by mouth daily as needed  for allergies., Disp: , Rfl:  .  Cholecalciferol (VITAMIN D3) 2000 UNITS capsule, Take 1 capsule (2,000 Units total) by mouth daily., Disp: 100 capsule, Rfl: 3 .  enoxaparin (LOVENOX) 30 MG/0.3ML injection, Inject 0.3 mLs (30 mg total) into the skin every 12 (twelve) hours as needed (travel only)., Disp: 10 Syringe, Rfl: 0 .  fluticasone (FLONASE) 50 MCG/ACT nasal spray, Place 2 sprays into both nostrils daily as needed for allergies or rhinitis., Disp: 16 g, Rfl: 3 .  furosemide (LASIX) 20 MG tablet, Take 1 tablet by mouth daily as needed for swelling., Disp: 30 tablet, Rfl: 11 .  InFLIXimab (REMICADE IV), Inject into the vein., Disp: , Rfl:  .  latanoprost (XALATAN) 0.005 % ophthalmic solution, Place 1 drop into both eyes at bedtime. , Disp: , Rfl: 0 .  levalbuterol (XOPENEX HFA) 45 MCG/ACT inhaler, Inhale 2 puffs into the lungs every 6 (six) hours as needed for wheezing., Disp: 1 Inhaler, Rfl: 12 .  ondansetron (ZOFRAN) 4 MG tablet, Take 1 tablet (4 mg total) by mouth every 8 (eight) hours as needed for nausea or vomiting., Disp: 20 tablet, Rfl: 0 .  Polyethyl Glycol-Propyl Glycol (SYSTANE) 0.4-0.3 % SOLN, Apply 1 drop to eye daily as needed (for dry eyes)., Disp: , Rfl:  .  predniSONE (DELTASONE) 10 MG tablet, TAKE ONE AND ONE-HALF TABLETS DAILY WITH BREAKFAST (Patient taking differently: Take 10 mg by mouth daily with breakfast. ), Disp: 135 tablet, Rfl: 1 .  isosorbide mononitrate (IMDUR) 30 MG 24 hr tablet, Take 0.5 tablets (15 mg total) daily by mouth., Disp: 45 tablet, Rfl: 3 .  saccharomyces boulardii (FLORASTOR) 250 MG capsule, Take 1 capsule (250 mg total) by mouth 2 (two) times daily., Disp: 60 capsule, Rfl: 0

## 2017-09-19 DIAGNOSIS — M0579 Rheumatoid arthritis with rheumatoid factor of multiple sites without organ or systems involvement: Secondary | ICD-10-CM | POA: Diagnosis not present

## 2017-09-19 DIAGNOSIS — J849 Interstitial pulmonary disease, unspecified: Secondary | ICD-10-CM | POA: Diagnosis not present

## 2017-09-19 DIAGNOSIS — M2550 Pain in unspecified joint: Secondary | ICD-10-CM | POA: Diagnosis not present

## 2017-09-19 DIAGNOSIS — Z6823 Body mass index (BMI) 23.0-23.9, adult: Secondary | ICD-10-CM | POA: Diagnosis not present

## 2017-09-19 DIAGNOSIS — Z79899 Other long term (current) drug therapy: Secondary | ICD-10-CM | POA: Diagnosis not present

## 2017-10-13 ENCOUNTER — Ambulatory Visit (INDEPENDENT_AMBULATORY_CARE_PROVIDER_SITE_OTHER): Payer: Medicare Other | Admitting: Pulmonary Disease

## 2017-10-13 ENCOUNTER — Encounter: Payer: Self-pay | Admitting: Pulmonary Disease

## 2017-10-13 ENCOUNTER — Other Ambulatory Visit (INDEPENDENT_AMBULATORY_CARE_PROVIDER_SITE_OTHER): Payer: Medicare Other

## 2017-10-13 VITALS — BP 110/68 | HR 96 | Ht 65.0 in | Wt 140.8 lb

## 2017-10-13 DIAGNOSIS — J8410 Pulmonary fibrosis, unspecified: Secondary | ICD-10-CM

## 2017-10-13 DIAGNOSIS — I8290 Acute embolism and thrombosis of unspecified vein: Secondary | ICD-10-CM | POA: Diagnosis not present

## 2017-10-13 DIAGNOSIS — M059 Rheumatoid arthritis with rheumatoid factor, unspecified: Secondary | ICD-10-CM

## 2017-10-13 DIAGNOSIS — J849 Interstitial pulmonary disease, unspecified: Secondary | ICD-10-CM

## 2017-10-13 DIAGNOSIS — Z5181 Encounter for therapeutic drug level monitoring: Secondary | ICD-10-CM | POA: Diagnosis not present

## 2017-10-13 DIAGNOSIS — J9611 Chronic respiratory failure with hypoxia: Secondary | ICD-10-CM | POA: Diagnosis not present

## 2017-10-13 LAB — PULMONARY FUNCTION TEST
DL/VA % PRED: 46 %
DL/VA: 2.29 ml/min/mmHg/L
DLCO UNC: 6.17 ml/min/mmHg
DLCO unc % pred: 24 %
FEF 25-75 PRE: 1.24 L/s
FEF 25-75 Post: 1.64 L/sec
FEF2575-%Change-Post: 32 %
FEF2575-%Pred-Post: 137 %
FEF2575-%Pred-Pre: 104 %
FEV1-%CHANGE-POST: 3 %
FEV1-%PRED-PRE: 87 %
FEV1-%Pred-Post: 89 %
FEV1-Post: 1.36 L
FEV1-Pre: 1.32 L
FEV1FVC-%CHANGE-POST: 0 %
FEV1FVC-%PRED-PRE: 109 %
FEV6-%Change-Post: 3 %
FEV6-%PRED-POST: 89 %
FEV6-%Pred-Pre: 86 %
FEV6-PRE: 1.62 L
FEV6-Post: 1.67 L
FEV6FVC-%PRED-PRE: 104 %
FEV6FVC-%Pred-Post: 104 %
FVC-%Change-Post: 3 %
FVC-%PRED-PRE: 83 %
FVC-%Pred-Post: 86 %
FVC-PRE: 1.62 L
FVC-Post: 1.67 L
POST FEV1/FVC RATIO: 81 %
PRE FEV6/FVC RATIO: 100 %
Post FEV6/FVC ratio: 100 %
Pre FEV1/FVC ratio: 81 %
RV % pred: 66 %
RV: 1.7 L
TLC % PRED: 70 %
TLC: 3.64 L

## 2017-10-13 NOTE — Progress Notes (Signed)
PFT completed 10/13/17

## 2017-10-13 NOTE — Patient Instructions (Signed)
Rheumatoid arthritis associated interstitial lung disease: Continue Prednisone Continue Remicade We will likely start Imuran or CellCept after I have reviewed the images of the CT scan of your chest and discussed with Dr. Trudie Reed High-resolution CT scan of the chest ordered today  Chronic respiratory failure with hypoxemia: Continue 2 L of oxygen continuously Because of the ankle swelling I am going to check something called a BNP test to see if your heart is making you hold on to fluid  Follow-up with [place] in 4-6 weeks either with me or a nurse practitioner

## 2017-10-13 NOTE — Progress Notes (Signed)
Subjective:    Patient ID: [name] [name], female    DOB: 1931-01-11, 84 y.o.   MRN: [id number]  Synopsis: This is a very pleasant female who is diagnosed with interstitial lung disease in her mid 62s after evaluation at [facility] and [facility]. She was found to have a UIP pattern on CT scan and serology testing revealed a very high rheumatoid factor as well as anti-CCP antibody. She was followed by Dr. Elgie [name] at [facility] who felt like she had rheumatoid arthritis associated interstitial lung disease. He never treated her with immunosuppressive therapy. From 2000 07/09/2013 she's continued to follow in both [place] and due to her lung function has remained stable during that time. She has not developed new shortness of breath. She has had repeated episodes of pulmonary emboli. The most recent was in the summer of 2014 when she developed her fourth pulmonary embolism after returning from a trip to [place]. In 2018 she had worsening so in addition to infliximab she was treated with prednisone daily.  During that year she also started using oxygen 2 L continuously.   HPI Chief Complaint  Patient presents with  . Follow-up    PFT done today, reports inreasing memory loss.    [name] [name] is concerned about her breathing and her memory.  She says that she got lost going to the beauty shop the other day and she's never had this happen before.  She has a strong family history of Alzheimers.  She has left the stove on in the past well.  She doesn't think that this correlates with her oxygen.  She says the breathing is better on oxygen.  She remains concerned about her breathing.   She has been noted to have some coronary artery disease.    Past Medical History:  Diagnosis Date  . Allergic rhinitis   . Allergy   . CAD (coronary artery disease)    Mild CAD by cath 2008  . DJD (degenerative joint disease)    rheumatoid  . DVT (deep venous thrombosis)  ([place])   . Dyslipidemia   . History of nuclear stress test    Myoview 5/17:  EF 74%, normal perfusion, low risk  . History of pulmonary embolism   . Hyperlipidemia   . Insomnia   . Osteoporosis   . Pulmonary fibrosis ([place])   . Rheumatoid arthritis ([place])        Review of Systems  Constitutional: Negative for chills, fatigue and fever.  HENT: Negative for congestion, postnasal drip and rhinorrhea.   Respiratory: Positive for cough and chest tightness. Negative for shortness of breath and wheezing.   Cardiovascular: Negative for chest pain, palpitations and leg swelling.       Objective:   Physical Exam  Vitals:   10/13/17 1607  BP: 110/68  Pulse: 96  SpO2: 95%  Weight: 140 lb 12.8 oz (63.9 kg)  Height: 5\' 5"  (1.651 m)  2L O2  Gen: well appearing HENT: OP clear, TM's clear, neck supple PULM: Crackles bases B, normal percussion CV: RRR, no mgr, notable ankle edema GI: BS+, soft, nontender Derm: no cyanosis or rash Psyche: normal mood and affect   July 2017 pulmonary function testing FVC 1.81 L 90% predicted, total lung capacity 3.79 L 72% predicted, DLCO 8.29 32% predicted. April 2018 forced vital capacity 1.81 L 90% predicted, DLCO 8.29 mL 32% predicted August 2018 FVC 1.88 L 95% predicted, DLCO 7.14 mL 27% predicted April 2019 FVC 1.67  L 86% predicted, DLCO 6.17 mL 24% predicted  6 min walk July 2017 6 minute walk distance 324 m, O2 saturation nadir 93% on room air   BMET    Component Value Date/Time   NA 135 08/18/2017 2208   K 3.3 (L) 08/18/2017 2208   CL 99 (L) 08/18/2017 2208   CO2 24 08/18/2017 2208   GLUCOSE 91 08/18/2017 2208   BUN 10 08/18/2017 2208   CREATININE 1.00 08/18/2017 2208   CREATININE 0.74 11/30/2015 1327   CALCIUM 8.6 (L) 08/18/2017 2208   GFRNONAA 50 (L) 08/18/2017 2208   GFRAA 58 (L) 08/18/2017 2208        Assessment & Plan:   Therapeutic drug monitoring - Plan: Pro b natriuretic peptide, Thiopurine  methyltransferase(tpmt)rbc  Interstitial pulmonary disease ([place]) - Plan: CT CHEST HIGH RESOLUTION  Rheumatoid arthritis with positive rheumatoid factor, involving unspecified site (HCC)  Chronic respiratory failure with hypoxia (HCC)  Deep vein thrombosis (DVT) of non-extremity vein, unspecified chronicity  Discussion: I remain concerned about [name].  Specifically she now needs oxygen, and her lung function testing has shown a decline.  I believe this is due to worsening rheumatoid arthritis associated interstitial lung disease but all problems have a differential diagnosis and considering her ankle swelling recently I think we need to make sure she does not have evidence of congestive heart failure.  We will send blood work for a proBNP and check a high-resolution CT scan of the chest.  I suspect I will see findings of worsening ILD and not CHF.  If that is the case then I think we need to add Imuran or CellCept.  I will reach out to her rheumatologist to discuss further while checking a TMPT test today to make sure she can take Imuran.  Plan: Rheumatoid arthritis associated interstitial lung disease: Continue Prednisone Continue Remicade We will likely start Imuran or CellCept after I have reviewed the images of the CT scan of your chest and discussed with Dr. Trudie [name] High-resolution CT scan of the chest ordered today  Chronic respiratory failure with hypoxemia: Continue 2 L of oxygen continuously Because of the ankle swelling I am going to check something called a BNP test to see if your heart is making you hold on to fluid  Follow-up with [place] in 4-6 weeks either with me or a nurse practitioner    Current Outpatient Medications:  .  acetaminophen (TYLENOL) 500 MG tablet, Take 2 tablets (1,000 mg total) by mouth every 6 (six) hours as needed., Disp: 30 tablet, Rfl: 0 .  ALPRAZolam (XANAX) 0.25 MG tablet, Take 1 tablet (0.25 mg total) by mouth at bedtime as needed for anxiety., Disp: 15  tablet, Rfl: 0 .  aspirin EC 81 MG tablet, Take 1 tablet (81 mg total) by mouth daily., Disp: , Rfl:  .  atorvastatin (LIPITOR) 10 MG tablet, Take 1 tablet (10 mg total) by mouth daily., Disp: 90 tablet, Rfl: 3 .  cetirizine (ZYRTEC) 10 MG tablet, Take 5-10 mg by mouth daily as needed for allergies., Disp: , Rfl:  .  Cholecalciferol (VITAMIN D3) 2000 UNITS capsule, Take 1 capsule (2,000 Units total) by mouth daily., Disp: 100 capsule, Rfl: 3 .  enoxaparin (LOVENOX) 30 MG/0.3ML injection, Inject 0.3 mLs (30 mg total) into the skin every 12 (twelve) hours as needed (travel only)., Disp: 10 Syringe, Rfl: 0 .  fluticasone (FLONASE) 50 MCG/ACT nasal spray, Place 2 sprays into both nostrils daily as needed for allergies or rhinitis., Disp: 16  g, Rfl: 3 .  furosemide (LASIX) 20 MG tablet, Take 1 tablet by mouth daily as needed for swelling., Disp: 30 tablet, Rfl: 11 .  InFLIXimab (REMICADE IV), Inject into the vein., Disp: , Rfl:  .  latanoprost (XALATAN) 0.005 % ophthalmic solution, Place 1 drop into both eyes at bedtime. , Disp: , Rfl: 0 .  levalbuterol (XOPENEX HFA) 45 MCG/ACT inhaler, Inhale 2 puffs into the lungs every 6 (six) hours as needed for wheezing., Disp: 1 Inhaler, Rfl: 12 .  omega-3 acid ethyl esters (LOVAZA) 1 g capsule, Take 1 g by mouth daily., Disp: , Rfl:  .  ondansetron (ZOFRAN) 4 MG tablet, Take 1 tablet (4 mg total) by mouth every 8 (eight) hours as needed for nausea or vomiting., Disp: 20 tablet, Rfl: 0 .  Polyethyl Glycol-Propyl Glycol (SYSTANE) 0.4-0.3 % SOLN, Apply 1 drop to eye daily as needed (for dry eyes)., Disp: , Rfl:  .  predniSONE (DELTASONE) 10 MG tablet, TAKE ONE AND ONE-HALF TABLETS DAILY WITH BREAKFAST (Patient taking differently: Take 10 mg by mouth daily with breakfast. ), Disp: 135 tablet, Rfl: 1 .  saccharomyces boulardii (FLORASTOR) 250 MG capsule, Take 1 capsule (250 mg total) by mouth 2 (two) times daily., Disp: 60 capsule, Rfl: 0 .  isosorbide mononitrate  (IMDUR) 30 MG 24 hr tablet, Take 0.5 tablets (15 mg total) daily by mouth., Disp: 45 tablet, Rfl: 3

## 2017-10-16 LAB — BRAIN NATRIURETIC PEPTIDE: Pro B Natriuretic peptide (BNP): 55 pg/mL (ref 0.0–100.0)

## 2017-10-22 LAB — THIOPURINE METHYLTRANSFERASE (TPMT), RBC: Thiopurine Methyltransferase, RBC: 14 nmol/hr/mL RBC

## 2017-10-23 ENCOUNTER — Telehealth: Payer: Self-pay | Admitting: Pulmonary Disease

## 2017-10-23 NOTE — Telephone Encounter (Signed)
See lab encounter.

## 2017-10-23 NOTE — Telephone Encounter (Signed)
[name] [name], Please call her to tell her that her blood work showed that she can safely take the medicine azathioprine for her interstitial lung disease.  I'd like for her to start taking 25 mg daily, please Rx.  In two weeks she will need a CMET and CBC to monitor for toxicity. Please make sure she has an appointment here in 4 weeks with either me or an NP. Thanks, Ruby Cola

## 2017-10-24 MED ORDER — AZATHIOPRINE 50 MG PO TABS: 25.0000 mg | ORAL_TABLET | Freq: Every day | ORAL | 5 refills | Status: DC

## 2017-10-25 ENCOUNTER — Ambulatory Visit (INDEPENDENT_AMBULATORY_CARE_PROVIDER_SITE_OTHER)
Admission: RE | Admit: 2017-10-25 | Discharge: 2017-10-25 | Disposition: A | Discharge status: Home / Self Care | Payer: Medicare Other | Source: Ambulatory Visit | Attending: Pulmonary Disease | Admitting: Pulmonary Disease

## 2017-10-25 DIAGNOSIS — J8410 Pulmonary fibrosis, unspecified: Secondary | ICD-10-CM | POA: Diagnosis not present

## 2017-10-25 DIAGNOSIS — J849 Interstitial pulmonary disease, unspecified: Secondary | ICD-10-CM

## 2017-10-30 ENCOUNTER — Telehealth: Payer: Self-pay | Admitting: Pulmonary Disease

## 2017-10-30 DIAGNOSIS — J8410 Pulmonary fibrosis, unspecified: Secondary | ICD-10-CM

## 2017-10-30 DIAGNOSIS — M0579 Rheumatoid arthritis with rheumatoid factor of multiple sites without organ or systems involvement: Secondary | ICD-10-CM | POA: Diagnosis not present

## 2017-10-30 NOTE — Telephone Encounter (Signed)
[name] Doom, MD    [name] [name], Please call her to tell her that her blood work showed that she can safely take the medicine azathioprine for her interstitial lung disease.  I'd like for her to start taking 25 mg daily, please Rx.  In two weeks she will need a CMET and CBC to monitor for toxicity. Please make sure she has an appointment here in 4 weeks with either me or an NP. Thanks, [name] [name]     ---------------------------------------- Spoke with pt. She is aware of the above information. Orders have been placed for the repeat blood work. Pt is scheduled with [name] on 11/15/17. Nothing further was needed.

## 2017-11-09 NOTE — Progress Notes (Signed)
LM 11/09/17

## 2017-11-10 ENCOUNTER — Telehealth: Payer: Self-pay | Admitting: Pulmonary Disease

## 2017-11-10 NOTE — Telephone Encounter (Signed)
Spoke with pt, in a result note BQ wanted to know if she was still taking Imuran. Pt states she is taking Imuran. FYI BQ

## 2017-11-10 NOTE — Telephone Encounter (Signed)
Good, thanks.

## 2017-11-13 ENCOUNTER — Other Ambulatory Visit (INDEPENDENT_AMBULATORY_CARE_PROVIDER_SITE_OTHER): Payer: Medicare Other

## 2017-11-13 DIAGNOSIS — J8410 Pulmonary fibrosis, unspecified: Secondary | ICD-10-CM | POA: Diagnosis not present

## 2017-11-13 DIAGNOSIS — Z5181 Encounter for therapeutic drug level monitoring: Secondary | ICD-10-CM | POA: Diagnosis not present

## 2017-11-13 DIAGNOSIS — R0600 Dyspnea, unspecified: Secondary | ICD-10-CM | POA: Diagnosis not present

## 2017-11-13 DIAGNOSIS — R609 Edema, unspecified: Secondary | ICD-10-CM | POA: Diagnosis not present

## 2017-11-13 LAB — COMPREHENSIVE METABOLIC PANEL
ALT: 24 U/L (ref 0–35)
AST: 25 U/L (ref 0–37)
Albumin: 3.8 g/dL (ref 3.5–5.2)
Alkaline Phosphatase: 69 U/L (ref 39–117)
BUN: 19 mg/dL (ref 6–23)
CHLORIDE: 105 meq/L (ref 96–112)
CO2: 28 meq/L (ref 19–32)
Calcium: 9.7 mg/dL (ref 8.4–10.5)
Creatinine, Ser: 0.81 mg/dL (ref 0.40–1.20)
GFR: 86.4 mL/min (ref >60.00)
GLUCOSE: 136 mg/dL — AB (ref 70–99)
POTASSIUM: 4.2 meq/L (ref 3.5–5.1)
Sodium: 141 mEq/L (ref 135–145)
TOTAL PROTEIN: 7.5 g/dL (ref 6.0–8.3)
Total Bilirubin: 0.6 mg/dL (ref 0.2–1.2)

## 2017-11-13 LAB — CBC WITH DIFFERENTIAL/PLATELET
Basophils Absolute: 0.1 10*3/uL (ref 0.0–0.1)
Basophils Relative: 0.9 % (ref 0.0–3.0)
EOS PCT: 0.1 % (ref 0.0–5.0)
Eosinophils Absolute: 0 10*3/uL (ref 0.0–0.7)
HCT: 42 % (ref 36.0–46.0)
Hemoglobin: 13.9 g/dL (ref 12.0–15.0)
LYMPHS ABS: 3.7 10*3/uL (ref 0.7–4.0)
Lymphocytes Relative: 27 % (ref 12.0–46.0)
MCHC: 33 g/dL (ref 30.0–36.0)
MCV: 92.2 fl (ref 78.0–100.0)
MONOS PCT: 5.1 % (ref 3.0–12.0)
Monocytes Absolute: 0.7 10*3/uL (ref 0.1–1.0)
NEUTROS PCT: 66.9 % (ref 43.0–77.0)
Neutro Abs: 9.1 10*3/uL — ABNORMAL HIGH (ref 1.4–7.7)
PLATELETS: 194 10*3/uL (ref 150.0–400.0)
RBC: 4.56 Mil/uL (ref 3.87–5.11)
RDW: 14 % (ref 11.5–15.5)
WBC: 13.6 10*3/uL — ABNORMAL HIGH (ref 4.0–10.5)

## 2017-11-14 LAB — PRO B NATRIURETIC PEPTIDE: NT-Pro BNP: 41 pg/mL (ref 0–738)

## 2017-11-15 ENCOUNTER — Encounter: Payer: Self-pay | Admitting: Acute Care

## 2017-11-15 ENCOUNTER — Ambulatory Visit (INDEPENDENT_AMBULATORY_CARE_PROVIDER_SITE_OTHER): Payer: Medicare Other | Admitting: Acute Care

## 2017-11-15 VITALS — BP 100/58 | HR 84 | Ht 64.5 in | Wt 143.0 lb

## 2017-11-15 DIAGNOSIS — J9611 Chronic respiratory failure with hypoxia: Secondary | ICD-10-CM

## 2017-11-15 DIAGNOSIS — Z5181 Encounter for therapeutic drug level monitoring: Secondary | ICD-10-CM

## 2017-11-15 DIAGNOSIS — J8410 Pulmonary fibrosis, unspecified: Secondary | ICD-10-CM | POA: Diagnosis not present

## 2017-11-15 MED ORDER — SULFAMETHOXAZOLE-TRIMETHOPRIM 800-160 MG PO TABS: ORAL_TABLET | ORAL | 1 refills | Status: DC

## 2017-11-15 NOTE — Progress Notes (Signed)
History of Present Illness [name] [name] is a 84 y.o. female with ILD , UIP pattern. She is on chronic prednisone 10 mg daily, , Imuran 25 mg daily and Bactrim DS 1 tablet three times weekly ( Prophylaxis). She is followed by Dr.McQuaid.  Synopsis: This is a very pleasant female who is diagnosed with interstitial lung disease in her mid 36s after evaluation at [facility] and [facility]. She was found to have a UIP pattern on CT scan and serology testing revealed a very high rheumatoid factor as well as anti-CCP antibody. She was followed by Dr. Elgie Collard at [facility] who felt like she had rheumatoid arthritis associated interstitial lung disease. He never treated her with immunosuppressive therapy. From 2000 07/09/2013 she's continued to follow in both [place] and at [place]. Her lung function has remained stable during that time. She has not developed new shortness of breath. She has had repeated episodes of pulmonary emboli. The most recent was in the summer of 2014 when she developed her fourth pulmonary embolism after returning from a trip to [place]. In 2018 she had worsening so in addition to infliximab she was treated with prednisone daily.  During that year she also started using oxygen 2 L continuously.     11/15/2017 4-6 week Follow up per Dr. Lake Bells.  Patient was seen by Dr. Lake Bells 10/13/2017 for her ILD .At that time she has been concerned about her breathing and her memory loss. Discussion: Dr. Lake Bells had concerns after  visit  specifically that she now needs oxygen, and her lung function testing has shown a decline. He was concerned that  this was due to worsening rheumatoid arthritis associated interstitial lung disease but all problems have a differential diagnosis and considering her recent ankle swelling he was concerned it could be CHF.  Plan was for a proBNP, TMPT  and to check a high-resolution CT scan of the chest.  If ILD had progressed  plan was to consider adding  Imuran or CellCept in co-ordination with her Rheumatologist. She was started in Imuran daily once lab testing confirmed it was safe. She was told to decrease her prednisone to 10 mg daily while on her Imuran.  Pt. Presents for follow up today.. She had labs drawn 11/13/2017. Kidney function is good.LFT's are good. She is compliant with her medication daily. She has decreased her prednisone to 10 mg daily. She states her breathing is better since she started the Imuran. She is less short of breath. She states her energy is better. She does not feel the need to go to bed as early at night. She denies fever, chest pain, or hemoptysis.She has very few secretions. She is wearing her oxygen at 2 L [place]. Saturations are > 90%. She has no further questions or concerns.She takes her Zyrtec for allergies as needed.     Test Results: 10/25/2017>>HRCT: Pulmonary parenchymal pattern of fibrosis is consistent with usual interstitial pneumonitis, stable from 12/01/2015 but markedly progressive from baseline examination of 12/12/2006. Aortic atherosclerosis (ICD10-170.0). Three-vessel coronary artery calcification. Enlarged pulmonary arteries, indicative of pulmonary arterial Hypertension.  Echo 01/2017 Left ventricle: The cavity size was normal. Systolic function was   normal. The estimated ejection fraction was in the range of 60%   to 65%. Wall motion was normal; there were no regional wall   motion abnormalities. There was an increased relative   contribution of atrial contraction to ventricular filling.   Doppler parameters are consistent with abnormal  left ventricular   relaxation (grade 1 diastolic dysfunction). - Aortic valve: Trileaflet; normal thickness, mildly calcified   leaflets. - PASP was within normal range  Labs: 11/13/2017 NT  Pro BNP>> 41pg/ml 10/13/2017 Pro BNP>>55 pg/ml 10/13/2017 Thiopurine Methyltransferase, RBC nmol/hr/mL RBC 14    PFT's July 2017  pulmonary function testing FVC 1.81 L 90% predicted, total lung capacity 3.79 L 72% predicted, DLCO 8.29 32% predicted. April 2018 forced vital capacity 1.81 L 90% predicted, DLCO 8.29 mL 32% predicted August 2018 FVC 1.88 L 95% predicted, DLCO 7.14 mL 27% predicted April 2019 FVC 1.67 L 86% predicted, DLCO 6.17 mL 24% predicted  6 min walk July 2017 6 minute walk distance 324 m, O2 saturation nadir 93% on room air   CBC Latest Ref Rng & Units 11/13/2017 08/18/2017 01/30/2017  WBC 4.0 - 10.5 K/uL 13.6(H) 11.8(H) 10.3  Hemoglobin 12.0 - 15.0 g/dL 13.9 13.2 14.3  Hematocrit 36.0 - 46.0 % 42.0 40.6 42.8  Platelets 150.0 - 400.0 K/uL 194.0 156 198.0  Neutro Abs>> 9.1( up from 6.6 on 2 /02/2018)  BMP Latest Ref Rng & Units 11/13/2017 08/18/2017 10/24/2016  Glucose 70 - 99 mg/dL 136(H) 91 105(H)  BUN 6 - 23 mg/dL 19 10 13   Creatinine 0.40 - 1.20 mg/dL 0.81 1.00 0.75  Sodium 135 - 145 mEq/L 141 135 140  Potassium 3.5 - 5.1 mEq/L 4.2 3.3(L) 4.0  Chloride 96 - 112 mEq/L 105 99(L) 106  CO2 19 - 32 mEq/L 28 24 28   Calcium 8.4 - 10.5 mg/dL 9.7 8.6(L) 9.4    BNP No results found for: BNP  ProBNP    Component Value Date/Time   PROBNP 41 11/13/2017 1508   PROBNP 55.0 10/13/2017 1653    PFT    Component Value Date/Time   FEV1PRE 1.32 10/13/2017 1500   FEV1POST 1.36 10/13/2017 1500   FVCPRE 1.62 10/13/2017 1500   FVCPOST 1.67 10/13/2017 1500   TLC 3.64 10/13/2017 1500   DLCOUNC 6.17 10/13/2017 1500   PREFEV1FVCRT 81 10/13/2017 1500   PSTFEV1FVCRT 81 10/13/2017 1500    Ct Chest High Resolution  Result Date: 10/26/2017 CLINICAL DATA:  Pulmonary fibrosis. Increased shortness of breath. Oxygen therapy when walking. Remote arthritis. EXAM: CT CHEST WITHOUT CONTRAST TECHNIQUE: Multidetector CT imaging of the chest was performed following the standard protocol without intravenous contrast. High resolution imaging of the lungs, as well as inspiratory and expiratory imaging, was performed.  COMPARISON:  08/08/2016, 12/01/2015, 03/25/2015, 12/31/2012 and 12/12/2006. FINDINGS: Cardiovascular: Atherosclerotic calcification of the arterial vasculature, including three-vessel involvement of the coronary arteries. Pulmonary arteries and heart are enlarged. Mediastinum/Nodes: Mediastinal lymph nodes are not enlarged by CT size criteria. Hilar regions are difficult to evaluate without IV contrast. No axillary adenopathy. Esophagus is grossly unremarkable. Tiny hiatal hernia. Lungs/Pleura: Peripheral and basilar predominant pattern of subpleural reticulation, extensive honeycombing and traction bronchiectasis/bronchiolectasis, stable from 12/01/2015 but rather progressive from baseline examination of 12/12/2006. No superimposed worrisome nodule or mass. No pleural fluid. Airway is unremarkable. No air trapping. Upper Abdomen: Visualized portions of the liver, adrenal glands, kidneys, spleen, pancreas, stomach and bowel are grossly unremarkable with exception of a tiny hiatal hernia. Musculoskeletal: Degenerative changes in the spine. No worrisome lytic or sclerotic lesions. IMPRESSION: 1. Pulmonary parenchymal pattern of fibrosis is consistent with usual interstitial pneumonitis, stable from 12/01/2015 but markedly progressive from baseline examination of 12/12/2006. 2. Aortic atherosclerosis (ICD10-170.0). Three-vessel coronary artery calcification. 3. Enlarged pulmonary arteries, indicative of pulmonary arterial hypertension. Electronically Signed   By: Rip Harbour  Blietz M.D.   On: 10/26/2017 09:03     Past medical hx Past Medical History:  Diagnosis Date  . Allergic rhinitis   . Allergy   . CAD (coronary artery disease)    Mild CAD by cath 2008  . DJD (degenerative joint disease)    rheumatoid  . DVT (deep venous thrombosis) ([place])   . Dyslipidemia   . History of nuclear stress test    Myoview 5/17:  EF 74%, normal perfusion, low risk  . History of pulmonary embolism   . Hyperlipidemia   .  Insomnia   . Osteoporosis   . Pulmonary fibrosis ([place])   . Rheumatoid arthritis ([place])      Social History   Tobacco Use  . Smoking status: Former Smoker    Packs/day: 0.30    Years: 20.00    Pack years: 6.00    Types: Cigarettes    Last attempt to quit: 07/11/1973    Years since quitting: 44.3  . Smokeless tobacco: Never Used  Substance Use Topics  . Alcohol use: No    Alcohol/week: 0.0 oz  . Drug use: No    [name] [name] reports that she quit smoking about 44 years ago. Her smoking use included cigarettes. She has a 6.00 pack-year smoking history. She has never used smokeless tobacco. She reports that she does not drink alcohol or use drugs.  Tobacco Cessation: Former smoker  Past surgical hx, Family hx, Social hx all reviewed.  Current Outpatient Medications on File Prior to Visit  Medication Sig  . acetaminophen (TYLENOL) 500 MG tablet Take 2 tablets (1,000 mg total) by mouth every 6 (six) hours as needed.  . ALPRAZolam (XANAX) 0.25 MG tablet Take 1 tablet (0.25 mg total) by mouth at bedtime as needed for anxiety.  [name] aspirin EC 81 MG tablet Take 1 tablet (81 mg total) by mouth daily.  [name] atorvastatin (LIPITOR) 10 MG tablet Take 1 tablet (10 mg total) by mouth daily.  [name] azaTHIOprine (IMURAN) 50 MG tablet Take 0.5 tablets (25 mg total) by mouth daily.  . cetirizine (ZYRTEC) 10 MG tablet Take 5-10 mg by mouth daily as needed for allergies.  . Cholecalciferol (VITAMIN D3) 2000 UNITS capsule Take 1 capsule (2,000 Units total) by mouth daily.  [name] enoxaparin (LOVENOX) 30 MG/0.3ML injection Inject 0.3 mLs (30 mg total) into the skin every 12 (twelve) hours as needed (travel only).  . fluticasone (FLONASE) 50 MCG/ACT nasal spray Place 2 sprays into both nostrils daily as needed for allergies or rhinitis.  . furosemide (LASIX) 20 MG tablet Take 1 tablet by mouth daily as needed for swelling.  . InFLIXimab (REMICADE IV) Inject into the vein.  [name] latanoprost (XALATAN) 0.005 % ophthalmic  solution Place 1 drop into both eyes at bedtime.   . levalbuterol (XOPENEX HFA) 45 MCG/ACT inhaler Inhale 2 puffs into the lungs every 6 (six) hours as needed for wheezing.  [name] omega-3 acid ethyl esters (LOVAZA) 1 g capsule Take 1 g by mouth daily.  . ondansetron (ZOFRAN) 4 MG tablet Take 1 tablet (4 mg total) by mouth every 8 (eight) hours as needed for nausea or vomiting.  [name] Glycol-Propyl Glycol (SYSTANE) 0.4-0.3 % SOLN Apply 1 drop to eye daily as needed (for dry eyes).  . predniSONE (DELTASONE) 10 MG tablet TAKE ONE AND ONE-HALF TABLETS DAILY WITH BREAKFAST (Patient taking differently: Take 10 mg by mouth daily with breakfast. )  . saccharomyces boulardii (FLORASTOR) 250 MG capsule Take 1 capsule (250 mg total)  by mouth 2 (two) times daily.  . isosorbide mononitrate (IMDUR) 30 MG 24 hr tablet Take 0.5 tablets (15 mg total) daily by mouth.   No current facility-administered medications on file prior to visit.      Allergies  Allergen Reactions  . Crestor [Rosuvastatin] Other (See Comments)    Muscle pain  . Lactose Intolerance (Gi) Other (See Comments)    Stomach upset  . Penicillins Hives  . Niacin Hives    Review Of Systems:  Constitutional:   No  weight loss, night sweats,  Fevers, chills, fatigue, or  lassitude.  HEENT:   No headaches,  Difficulty swallowing,  Tooth/dental problems, or  Sore throat,                No sneezing, itching, ear ache, nasal congestion, post nasal drip,   CV:  No chest pain,  Orthopnea, PND, swelling in lower extremities, anasarca, dizziness, palpitations, syncope.   GI  No heartburn, indigestion, abdominal pain, nausea, vomiting, diarrhea, change in bowel habits, loss of appetite, bloody stools.   Resp: + shortness of breath with exertion less at rest.  No excess mucus, no productive cough,  No non-productive cough,  No coughing up of blood.  No change in color of mucus.  No wheezing.  No chest wall deformity  Skin: no rash or  lesions.  GU: no dysuria, change in color of urine, no urgency or frequency.  No flank pain, no hematuria   MS:  No joint pain or swelling.  No decreased range of motion.  No back pain.  Psych:  No change in mood or affect. No depression or anxiety.  No memory loss.   Vital Signs BP (!) 100/58 (BP Location: Left Arm, Cuff Size: Normal)   Pulse 84   Ht 5' 4.5" (1.638 m)   Wt 143 lb (64.9 kg)   SpO2 (!) 87%   BMI 24.17 kg/m    Physical Exam:  General- No distress,  A&Ox3, pleasant ENT: No sinus tenderness, TM clear, pale nasal mucosa, no oral exudate,no post nasal drip, no LAN Cardiac: S1, S2, regular rate and rhythm, no murmur Chest: No wheeze/+  Crackles bilaterally per bases/ no dullness; no accessory muscle use, no nasal flaring, no sternal retractions Abd.: Soft Non-tender, ND, BS + Ext: No clubbing cyanosis, edema Neuro:  normal strength, MAE x 4. A&O x 3 Skin: No rashes, warm and dry Psych: normal mood and behavior   Assessment/Plan  Chronic respiratory failure with hypoxia (HCC) Continue wearing oxygen at 2 L [place] Saturation goals are > 90%  Postinflammatory pulmonary fibrosis (HCC) Tolerating Imuran well Renal function and LFT's ok Plan: We will continue the Imuran 25 mg once daily. Continue prednisone at 10 mg daily. Continue Remicaid We will add Bactrim DS 1 tablet 3 times a week ( Monday Wednesday Friday) Follow up in 1 month with labs prior. ( CBC, CMET) with Dr. Lake Bells or Judson Roch NP Continue wearing oxygen at 2 L [place] Saturation goal is greater than 90% Please contact office for sooner follow up if symptoms do not improve or worsen or seek emergency care      Magdalen Spatz, NP 11/15/2017  9:24 PM

## 2017-11-15 NOTE — Patient Instructions (Addendum)
It is nice to see you again today. We will continue the Imuran 25 mg once daily. Continue prednisone at 10 mg daily. We will add Bactrim DS 1 tablet 3 times a week ( Monday Wednesday Friday) Follow up in 1 month with labs prior. ( CBC, CMET) with Dr. Lake Bells or Judson Roch NP Continue wearing oxygen at 2 L [place] Saturation goal is greater than 90% Please contact office for sooner follow up if symptoms do not improve or worsen or seek emergency care

## 2017-11-15 NOTE — Assessment & Plan Note (Signed)
Continue wearing oxygen at 2 L [place] Saturation goals are > 90%

## 2017-11-15 NOTE — Assessment & Plan Note (Addendum)
Tolerating Imuran well Renal function and LFT's ok Plan: We will continue the Imuran 25 mg once daily. Continue prednisone at 10 mg daily. Continue Remicaid We will add Bactrim DS 1 tablet 3 times a week ( Monday Wednesday Friday) Follow up in 1 month with labs prior. ( CBC, CMET) with Dr. Lake Bells or Judson Roch NP Continue wearing oxygen at 2 L  Saturation goal is greater than 90% Please contact office for sooner follow up if symptoms do not improve or worsen or seek emergency care

## 2017-11-22 DIAGNOSIS — N3941 Urge incontinence: Secondary | ICD-10-CM | POA: Diagnosis not present

## 2017-11-22 DIAGNOSIS — N3020 Other chronic cystitis without hematuria: Secondary | ICD-10-CM | POA: Diagnosis not present

## 2017-12-11 ENCOUNTER — Telehealth: Payer: Self-pay | Admitting: Pulmonary Disease

## 2017-12-11 MED ORDER — AZITHROMYCIN 250 MG PO TABS: ORAL_TABLET | ORAL | 0 refills | Status: DC

## 2017-12-11 NOTE — Telephone Encounter (Signed)
Zpack, call if no improvement 

## 2017-12-11 NOTE — Telephone Encounter (Signed)
Called and spoke to patient. Relayed BQ's recommendations. Sent in Rx to patient's preferred pharmacy. Nothing further is needed at this time.

## 2017-12-11 NOTE — Telephone Encounter (Signed)
Called and spoke to patient. Patient stated the she isn't feeling well and has been coughing a lot, reports that it's productive with clear/white thick phlegm and frequent sneezing. Patient denies any fever. Patient would like to know BQ's recommendations.  BQ please advise. Thank you!

## 2017-12-18 ENCOUNTER — Other Ambulatory Visit (INDEPENDENT_AMBULATORY_CARE_PROVIDER_SITE_OTHER): Payer: Medicare Other

## 2017-12-18 DIAGNOSIS — Z5181 Encounter for therapeutic drug level monitoring: Secondary | ICD-10-CM | POA: Diagnosis not present

## 2017-12-18 LAB — COMPREHENSIVE METABOLIC PANEL
ALK PHOS: 67 U/L (ref 39–117)
ALT: 18 U/L (ref 0–35)
AST: 25 U/L (ref 0–37)
Albumin: 3.6 g/dL (ref 3.5–5.2)
BUN: 11 mg/dL (ref 6–23)
CO2: 26 mEq/L (ref 19–32)
Calcium: 9.1 mg/dL (ref 8.4–10.5)
Chloride: 104 mEq/L (ref 96–112)
Creatinine, Ser: 0.92 mg/dL (ref 0.40–1.20)
GFR: 74.58 mL/min (ref >60.00)
GLUCOSE: 89 mg/dL (ref 70–99)
POTASSIUM: 3.8 meq/L (ref 3.5–5.1)
Sodium: 138 mEq/L (ref 135–145)
TOTAL PROTEIN: 7.1 g/dL (ref 6.0–8.3)
Total Bilirubin: 0.7 mg/dL (ref 0.2–1.2)

## 2017-12-18 LAB — CBC
HCT: 39.7 % (ref 36.0–46.0)
HEMOGLOBIN: 13.3 g/dL (ref 12.0–15.0)
MCHC: 33.5 g/dL (ref 30.0–36.0)
MCV: 90.7 fl (ref 78.0–100.0)
PLATELETS: 199 10*3/uL (ref 150.0–400.0)
RBC: 4.37 Mil/uL (ref 3.87–5.11)
RDW: 13.4 % (ref 11.5–15.5)
WBC: 15.9 10*3/uL — ABNORMAL HIGH (ref 4.0–10.5)

## 2017-12-19 ENCOUNTER — Encounter: Payer: Self-pay | Admitting: Pulmonary Disease

## 2017-12-19 ENCOUNTER — Ambulatory Visit (INDEPENDENT_AMBULATORY_CARE_PROVIDER_SITE_OTHER): Payer: Medicare Other | Admitting: Physician Assistant

## 2017-12-19 ENCOUNTER — Encounter: Payer: Self-pay | Admitting: Physician Assistant

## 2017-12-19 ENCOUNTER — Ambulatory Visit (INDEPENDENT_AMBULATORY_CARE_PROVIDER_SITE_OTHER): Payer: Medicare Other | Admitting: Pulmonary Disease

## 2017-12-19 VITALS — BP 104/60 | HR 96 | Ht 64.0 in | Wt 139.0 lb

## 2017-12-19 VITALS — BP 106/70 | HR 92 | Ht 64.6 in | Wt 140.0 lb

## 2017-12-19 DIAGNOSIS — J9611 Chronic respiratory failure with hypoxia: Secondary | ICD-10-CM | POA: Diagnosis not present

## 2017-12-19 DIAGNOSIS — Z5181 Encounter for therapeutic drug level monitoring: Secondary | ICD-10-CM | POA: Diagnosis not present

## 2017-12-19 DIAGNOSIS — J8410 Pulmonary fibrosis, unspecified: Secondary | ICD-10-CM | POA: Diagnosis not present

## 2017-12-19 DIAGNOSIS — E785 Hyperlipidemia, unspecified: Secondary | ICD-10-CM | POA: Diagnosis not present

## 2017-12-19 DIAGNOSIS — I25119 Atherosclerotic heart disease of native coronary artery with unspecified angina pectoris: Secondary | ICD-10-CM

## 2017-12-19 DIAGNOSIS — I5032 Chronic diastolic (congestive) heart failure: Secondary | ICD-10-CM

## 2017-12-19 DIAGNOSIS — M059 Rheumatoid arthritis with rheumatoid factor, unspecified: Secondary | ICD-10-CM

## 2017-12-19 MED ORDER — AZITHROMYCIN 250 MG PO TABS: ORAL_TABLET | ORAL | 0 refills | Status: DC

## 2017-12-19 NOTE — Patient Instructions (Signed)
Rheumatoid arthritis associated lung disease: Decrease prednisone to 5 mg daily Continue Remicade Continue Imuran 25 mg daily, on next month's visit we will increase that dose to 50 mg daily if you are feeling well  Chronic respiratory failure with hypoxemia: Continue using 2 L of oxygen continuously  Acute sinusitis: Take a Z-Pak Call me if no improvement  We will see you back in 4 weeks or sooner if needed

## 2017-12-19 NOTE — Patient Instructions (Addendum)
Medication Instructions:  Your physician recommends that you continue on your current medications as directed. Please refer to the Current Medication list given to you today.   Labwork: None ordered  Testing/Procedures: None ordered  Follow-Up: Your physician wants you to follow-up in: 6 MONTHS WITH DR. MCALHANY   You will receive a reminder letter in the mail two months in advance. If you don't receive a letter, please call our office to schedule the follow-up appointment.   Any Other Special Instructions Will Be Listed Below (If Applicable).     If you need a refill on your cardiac medications before your next appointment, please call your pharmacy.   

## 2017-12-19 NOTE — Progress Notes (Signed)
Subjective:    Patient ID: [name] [name], female    DOB: [birth year], 84 y.o.   MRN: [id number]  Synopsis: This is a very pleasant female who is diagnosed with interstitial lung disease in her mid 67s after evaluation at [facility] and [facility]. She was found to have a UIP pattern on CT scan and serology testing revealed a very high rheumatoid factor as well as anti-CCP antibody. She was followed by Dr. Elgie Collard at [facility] who felt like she had rheumatoid arthritis associated interstitial lung disease. He never treated her with immunosuppressive therapy. From 2000 07/09/2013 she's continued to follow in both [place] and due to her lung function has remained stable during that time. She has not developed new shortness of breath. She has had repeated episodes of pulmonary emboli. The most recent was in the summer of 2014 when she developed her fourth pulmonary embolism after returning from a trip to [place]. In 2018 she had worsening so in addition to infliximab she was treated with prednisone daily.  During that year she also started using oxygen 2 L continuously. Noted to have worsening fibrotic change in 2019 so we started Imuran 25mg  daily.   HPI Chief Complaint  Patient presents with  . Follow-up    1  month ROV, pt reports "having a cold"    [name] [name] has struggled lately. She has been dealing with headache, sinus congestion and the mucus is running down the back of her throat.  She has no sick contacts recently.  She has been out some and tried to do her best to prevent having too many sick contacts.  She says she feels a bit more short of breath than normal but she does not feel that the mucus is coming from her chest.  No recent fevers or chills.  No leg swelling.  She continues to take prednisone, Bactrim, and Imuran per our direction.  Past Medical History:  Diagnosis Date  . Allergic rhinitis   . Allergy   . CAD (coronary artery disease)     Mild CAD by cath 2008  . DJD (degenerative joint disease)    rheumatoid  . DVT (deep venous thrombosis) ([place])   . Dyslipidemia   . History of nuclear stress test    Myoview 5/17:  EF 74%, normal perfusion, low risk  . History of pulmonary embolism   . Hyperlipidemia   . Insomnia   . Osteoporosis   . Pulmonary fibrosis ([place])   . Rheumatoid arthritis ([place])        Review of Systems  Constitutional: Negative for chills, fatigue and fever.  HENT: Negative for congestion, postnasal drip and rhinorrhea.   Respiratory: Positive for cough and chest tightness. Negative for shortness of breath and wheezing.   Cardiovascular: Negative for chest pain, palpitations and leg swelling.       Objective:   Physical Exam  Vitals:   12/19/17 1517  BP: 104/60  Pulse: 96  SpO2: 99%  Weight: 139 lb (63 kg)  Height: 5\' 4"  (1.626 m)  2L O2  Gen: well appearing HENT: OP clear, TM's clear, neck supple PULM: Crackles bases B, normal percussion CV: RRR, no mgr, trace edema GI: BS+, soft, nontender Derm: no cyanosis or rash Psyche: normal mood and affect    July 2017 pulmonary function testing FVC 1.81 L 90% predicted, total lung capacity 3.79 L 72% predicted, DLCO 8.29 32% predicted. April 2018 forced vital capacity 1.81 L 90% predicted, DLCO  8.29 mL 32% predicted August 2018 FVC 1.88 L 95% predicted, DLCO 7.14 mL 27% predicted April 2019 FVC 1.67 L 86% predicted, DLCO 6.17 mL 24% predicted  6 min walk July 2017 6 minute walk distance 324 m, O2 saturation nadir 93% on room air  Labs:  12/18/2017 LFT: normal, CBC OK  BMET    Component Value Date/Time   NA 138 12/18/2017 1045   K 3.8 12/18/2017 1045   CL 104 12/18/2017 1045   CO2 26 12/18/2017 1045   GLUCOSE 89 12/18/2017 1045   BUN 11 12/18/2017 1045   CREATININE 0.92 12/18/2017 1045   CREATININE 0.74 11/30/2015 1327   CALCIUM 9.1 12/18/2017 1045   GFRNONAA 50 (L) 08/18/2017 2208   GFRAA 58 (L) 08/18/2017 2208         Assessment & Plan:   Therapeutic drug monitoring  Chronic respiratory failure with hypoxia (HCC)  Postinflammatory pulmonary fibrosis (HCC)  Rheumatoid arthritis with positive rheumatoid factor, involving unspecified site [facility])  Discussion: Aubree is still struggling with a sinus infection.  I worry that this may be related to her being on more intense immunosuppression but I do not think we have a very good option to change off of her medication regimen right now.  Ideally I was hoping to increase the Imuran today.  There is no evidence of toxicity from that based on yesterday's lab work.  Plan: Rheumatoid arthritis associated lung disease: Decrease prednisone to 5 mg daily Continue Remicade Continue Imuran 25 mg daily, on next month's visit we will increase that dose to 50 mg daily if you are feeling well  Chronic respiratory failure with hypoxemia: Continue using 2 L of oxygen continuously  Acute sinusitis: Take a Z-Pak Call me if no improvement  We will see you back in 4 weeks or sooner if needed   Current Outpatient Medications:  .  acetaminophen (TYLENOL) 500 MG tablet, Take 2 tablets (1,000 mg total) by mouth every 6 (six) hours as needed., Disp: 30 tablet, Rfl: 0 .  ALPRAZolam (XANAX) 0.25 MG tablet, Take 1 tablet (0.25 mg total) by mouth at bedtime as needed for anxiety., Disp: 15 tablet, Rfl: 0 .  aspirin EC 81 MG tablet, Take 1 tablet (81 mg total) by mouth daily., Disp: , Rfl:  .  atorvastatin (LIPITOR) 10 MG tablet, Take 1 tablet (10 mg total) by mouth daily., Disp: 90 tablet, Rfl: 3 .  azaTHIOprine (IMURAN) 50 MG tablet, Take 0.5 tablets (25 mg total) by mouth daily., Disp: 30 tablet, Rfl: 5 .  cetirizine (ZYRTEC) 10 MG tablet, Take 5-10 mg by mouth daily as needed for allergies., Disp: , Rfl:  .  Cholecalciferol (VITAMIN D3) 2000 UNITS capsule, Take 1 capsule (2,000 Units total) by mouth daily., Disp: 100 capsule, Rfl: 3 .  enoxaparin (LOVENOX) 30 MG/0.3ML  injection, Inject 0.3 mLs (30 mg total) into the skin every 12 (twelve) hours as needed (travel only)., Disp: 10 Syringe, Rfl: 0 .  fluticasone (FLONASE) 50 MCG/ACT nasal spray, Place 2 sprays into both nostrils daily as needed for allergies or rhinitis., Disp: 16 g, Rfl: 3 .  furosemide (LASIX) 20 MG tablet, Take 1 tablet by mouth daily as needed for swelling., Disp: 30 tablet, Rfl: 11 .  InFLIXimab (REMICADE IV), Inject into the vein., Disp: , Rfl:  .  latanoprost (XALATAN) 0.005 % ophthalmic solution, Place 1 drop into both eyes at bedtime. , Disp: , Rfl: 0 .  levalbuterol (XOPENEX HFA) 45 MCG/ACT inhaler, Inhale 2 puffs  into the lungs every 6 (six) hours as needed for wheezing., Disp: 1 Inhaler, Rfl: 12 .  omega-3 acid ethyl esters (LOVAZA) 1 g capsule, Take 1 g by mouth daily., Disp: , Rfl:  .  ondansetron (ZOFRAN) 4 MG tablet, Take 1 tablet (4 mg total) by mouth every 8 (eight) hours as needed for nausea or vomiting., Disp: 20 tablet, Rfl: 0 .  Polyethyl Glycol-Propyl Glycol (SYSTANE) 0.4-0.3 % SOLN, Apply 1 drop to eye daily as needed (for dry eyes)., Disp: , Rfl:  .  predniSONE (DELTASONE) 10 MG tablet, TAKE ONE AND ONE-HALF TABLETS DAILY WITH BREAKFAST (Patient taking differently: Take 10 mg by mouth daily with breakfast. ), Disp: 135 tablet, Rfl: 1 .  saccharomyces boulardii (FLORASTOR) 250 MG capsule, Take 1 capsule (250 mg total) by mouth 2 (two) times daily., Disp: 60 capsule, Rfl: 0 .  sulfamethoxazole-trimethoprim (BACTRIM DS) 800-160 MG tablet, 1 tablet 3 times a week Monday, Wednesday, Friday., Disp: 30 tablet, Rfl: 1 .  azithromycin (ZITHROMAX) 250 MG tablet, Take as directed, Disp: 6 each, Rfl: 0 .  isosorbide mononitrate (IMDUR) 30 MG 24 hr tablet, Take 0.5 tablets (15 mg total) daily by mouth., Disp: 45 tablet, Rfl: 3

## 2017-12-19 NOTE — Progress Notes (Signed)
Cardiology Office Note    Date:  12/19/2017   ID:  [name], [name] 1930-11-03, MRN [id number]  PCP:  Hoyt Koch, MD  Cardiologist: Lauree Chandler, MD  Chief Complaint  Patient presents with  . Follow-up    History of Present Illness:  [name] [name] is a 84 y.o. female history of CAD, cath in 2008 with mild CAD, repeat cath 06/2013 30% mid LAD, 30% mid circumflex, 20% mid RCA.  Nuclear stress test 11/2015 without ischemia, chest CTA without acute PE.  Echo 01/2017 normal LVEF 65% with grade 1 DD no valvular disease.  Continued to have chest pain.  Nuclear stress test 06/08/2017 no ischemia.  Also has history of DVT/PE, HLD, pulmonary fibrosis on home oxygen.  Last seen by Dr. Angelena Form 06/2017 at which time she had mild lower extremity edema.  She was given Lasix daily for 5 days then to take as needed.  Has been followed closely by pulmonary with worsening shortness of breath and medication adjustment.  Seems to have improved with Imuran. BNP to rule out CHF was 55 in April.  Patient comes in today for f/u. Now on O2 full time. Also has a cold and is on a Z-Pak but does not think it is working.  She sees Dr. Lake Bells this afternoon.  Denies cardiac complaints.  Sore in chest from coughing and chronic dyspnea on exertion.  She only takes Lasix as needed and does not use them very frequently.    Past Medical History:  Diagnosis Date  . Allergic rhinitis   . Allergy   . CAD (coronary artery disease)    Mild CAD by cath 2008  . DJD (degenerative joint disease)    rheumatoid  . DVT (deep venous thrombosis) ([place])   . Dyslipidemia   . History of nuclear stress test    Myoview 5/17:  EF 74%, normal perfusion, low risk  . History of pulmonary embolism   . Hyperlipidemia   . Insomnia   . Osteoporosis   . Pulmonary fibrosis ([place])   . Rheumatoid arthritis [facility])     Past Surgical History:  Procedure Laterality Date  . ABDOMINAL HYSTERECTOMY  1974  . APPENDECTOMY   1959  . CATARACT EXTRACTION    . LEFT HEART CATHETERIZATION WITH CORONARY ANGIOGRAM N/A 06/18/2013   Procedure: LEFT HEART CATHETERIZATION WITH CORONARY ANGIOGRAM;  Surgeon: Peter M Martinique, MD;  Location: [facility] CATH LAB;  Service: Cardiovascular;  Laterality: N/A;  . TONSILLECTOMY  1941, 1951  . TOTAL KNEE ARTHROPLASTY  1997, 2007   Bilateral    Current Medications: Current Meds  Medication Sig  . acetaminophen (TYLENOL) 500 MG tablet Take 2 tablets (1,000 mg total) by mouth every 6 (six) hours as needed.  . ALPRAZolam (XANAX) 0.25 MG tablet Take 1 tablet (0.25 mg total) by mouth at bedtime as needed for anxiety.  [name] aspirin EC 81 MG tablet Take 1 tablet (81 mg total) by mouth daily.  [name] atorvastatin (LIPITOR) 10 MG tablet Take 1 tablet (10 mg total) by mouth daily.  [name] azaTHIOprine (IMURAN) 50 MG tablet Take 0.5 tablets (25 mg total) by mouth daily.  . cetirizine (ZYRTEC) 10 MG tablet Take 5-10 mg by mouth daily as needed for allergies.  . Cholecalciferol (VITAMIN D3) 2000 UNITS capsule Take 1 capsule (2,000 Units total) by mouth daily.  [name] enoxaparin (LOVENOX) 30 MG/0.3ML injection Inject 0.3 mLs (30 mg total) into the skin every 12 (twelve) hours as needed (travel only).  [name]  fluticasone (FLONASE) 50 MCG/ACT nasal spray Place 2 sprays into both nostrils daily as needed for allergies or rhinitis.  . furosemide (LASIX) 20 MG tablet Take 1 tablet by mouth daily as needed for swelling.  . InFLIXimab (REMICADE IV) Inject into the vein.  [name] latanoprost (XALATAN) 0.005 % ophthalmic solution Place 1 drop into both eyes at bedtime.   . levalbuterol (XOPENEX HFA) 45 MCG/ACT inhaler Inhale 2 puffs into the lungs every 6 (six) hours as needed for wheezing.  [name] omega-3 acid ethyl esters (LOVAZA) 1 g capsule Take 1 g by mouth daily.  . ondansetron (ZOFRAN) 4 MG tablet Take 1 tablet (4 mg total) by mouth every 8 (eight) hours as needed for nausea or vomiting.  [name] Glycol-Propyl Glycol (SYSTANE) 0.4-0.3 %  SOLN Apply 1 drop to eye daily as needed (for dry eyes).  . predniSONE (DELTASONE) 10 MG tablet TAKE ONE AND ONE-HALF TABLETS DAILY WITH BREAKFAST (Patient taking differently: Take 10 mg by mouth daily with breakfast. )  . saccharomyces boulardii (FLORASTOR) 250 MG capsule Take 1 capsule (250 mg total) by mouth 2 (two) times daily.  [name] sulfamethoxazole-trimethoprim (BACTRIM DS) 800-160 MG tablet 1 tablet 3 times a week Monday, Wednesday, Friday.     Allergies:   Crestor [rosuvastatin]; Lactose intolerance (gi); Penicillins; and Niacin   Social History   Socioeconomic History  . Marital status: Widowed    Spouse name: Not on file  . Number of children: 2  . Years of education: Not on file  . Highest education level: Not on file  Occupational History  . 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 Financial resource strain: Not hard at all  . Food insecurity:    Worry: Never true    Inability: Never true  . Transportation needs:    Medical: No    Non-medical: No  Tobacco Use  . Smoking status: Former Smoker    Packs/day: 0.30    Years: 20.00    Pack years: 6.00    Types: Cigarettes    Last attempt to quit: 07/11/1973    Years since quitting: 44.4  . Smokeless tobacco: Never Used  Substance and Sexual Activity  . Alcohol use: No    Alcohol/week: 0.0 oz  . Drug use: No  . Sexual activity: Never  Lifestyle  . Physical activity:    Days per week: 0 days    Minutes per session: 0 min  . Stress: Only a little  Relationships  . Social connections:    Talks on phone: More than three times a week    Gets together: More than three times a week    Attends religious service: More than 4 times per year    Active member of club or organization: Not on file    Attends meetings of clubs or organizations: More than 4 times per year    Relationship status: Widowed  Other Topics Concern  . Not on file  Social History Narrative   Diet:   Do you  drink/eat things with caffeine? Yes   Marital status:        Widowed                      What year were you married?1954   Do you live in a house, apartment, assisted living, condo, trailer, etc)? House   Is it one or more stories? 1 1/4   How many persons live in your  home?1   Do you have any pets in your home? No   Current or past profession:  Publishing rights manager   Do you exercise?        Yes                                            Type & how often:  Walk                                    Do you have a living will? Yes   Do you have a DNR Form? Yes   Do you have a POA/HPOA forms?  Yes     Family History:  The patient's family history includes Alzheimer's disease in her sister and sister; Cancer in her daughter; Heart attack (age of onset: 32) in her father; Heart disease in her sister; Kidney disease (age of onset: 65) in her daughter.   ROS:   Please see the history of present illness.    Review of Systems  Constitution: Positive for malaise/fatigue.  Eyes: Positive for visual disturbance.  Cardiovascular: Positive for chest pain and leg swelling.  Respiratory: Positive for cough.   Skin: Positive for rash.  Musculoskeletal: Positive for myalgias.   All other systems reviewed and are negative.   PHYSICAL EXAM:   VS:  BP 106/70   Pulse 92   Ht 5' 4.6" (1.641 m)   Wt 140 lb (63.5 kg)   SpO2 98%   BMI 23.59 kg/m   Physical Exam  GEN: Well nourished, well developed, in no acute distress  Neck: no JVD, carotid bruits, or masses Cardiac:RRR; 1/6 systolic murmur at the left sternal border Respiratory: De creased breath sounds with crackles worse at the right lung field  GI: soft, nontender, nondistended, + BS Ext: Trace of ankle edema without cyanosis, clubbing, Good distal pulses bilaterally MS: no deformity or atrophy  Skin: warm and dry, no rash-says she has had it for many years but is not present today. Neuro:  Alert and Oriented x 3 Psych: euthymic mood,  full affect  Wt Readings from Last 3 Encounters:  12/19/17 140 lb (63.5 kg)  11/15/17 143 lb (64.9 kg)  10/13/17 140 lb 12.8 oz (63.9 kg)      Studies/Labs Reviewed:   EKG:  EKG is  ordered today.  The ekg ordered today demonstrates normal sinus rhythm at 92 bpm, no acute change  Recent Labs: 11/13/2017: NT-Pro BNP 41 12/18/2017: ALT 18; BUN 11; Creatinine, Ser 0.92; Hemoglobin 13.3; Platelets 199.0; Potassium 3.8; Sodium 138   Lipid Panel    Component Value Date/Time   CHOL 177 12/23/2016 0931   TRIG 96 12/23/2016 0931   HDL 70 12/23/2016 0931   CHOLHDL 2.5 12/23/2016 0931   CHOLHDL 3.1 10/13/2015 0945   VLDL 26 10/13/2015 0945   LDLCALC 88 12/23/2016 0931    Additional studies/ records that were reviewed today include:  Nuclear stress test 12/2018Study Highlights    Nuclear stress EF: 97%.  The study is normal. No ischemia  This is a low risk study.     Echo 02/03/17: - Left ventricle: The cavity size was normal. Systolic function was   normal. The estimated ejection fraction was in the range of 60%   to 65%. Wall motion was normal;  there were no regional wall   motion abnormalities. There was an increased relative   contribution of atrial contraction to ventricular filling.   Doppler parameters are consistent with abnormal left ventricular   relaxation (grade 1 diastolic dysfunction). - Aortic valve: Trileaflet; normal thickness, mildly calcified   leaflets.   Cardiac cath 06/18/13: Left mainstem: Normal  Left anterior descending (LAD): Mild calcification. 30% disease in the mid vessel at the take off of the first diagonal.  Left circumflex (LCx): 30% disease in the mid vessel.  Right coronary artery (RCA): Mild calcification. Mild disease in the proximal and mid vessel to 20%.  Left ventriculography: Left ventricular systolic function is normal, LVEF is estimated at 55-65%, there is no significant mitral regurgitation        ASSESSMENT:    1. Coronary  artery disease involving native coronary artery of native heart with angina pectoris ([place])   2. Chronic respiratory failure with hypoxia (HCC)   3. Dyslipidemia   4. Chronic diastolic CHF (congestive heart failure) (HCC)      PLAN:  In order of problems listed above:  CAD nonobstructive on prior cath with negative nuclear stress test 05/2017-chest sore from coughing today but no chest tightness.  Follow-up with Dr. Angelena Form in 6 months.  Chronic respiratory failure with hypoxia now on home O2 almost all day long followed closely by Dr. Griffith Citron today  Dyslipidemia LDL 88 12/2016-just had blood work yesterday and so do not want to re-stick her.  Should have follow-up lipids with next blood work.  Chronic diastolic CHF on PRN Lasix-rarely has to take    Medication Adjustments/Labs and Tests Ordered: Current medicines are reviewed at length with the patient today.  Concerns regarding medicines are outlined above.  Medication changes, Labs and Tests ordered today are listed in the Patient Instructions below. Patient Instructions  Medication Instructions:  Your physician recommends that you continue on your current medications as directed. Please refer to the Current Medication list given to you today.   Labwork: None ordered  Testing/Procedures: None ordered  Follow-Up: Your physician wants you to follow-up in: [place] DR. Angelena Form   You will receive a reminder letter in the mail two months in advance. If you don't receive a letter, please call our office to schedule the follow-up appointment.    Any Other Special Instructions Will Be Listed Below (If Applicable).     If you need a refill on your cardiac medications before your next appointment, please call your pharmacy.      Signed, Ermalinda Barrios, PA-C  12/19/2017 1:25 PM    [facility] [place], [place], [place]  [address] Phone: [phone]; Fax: [phone]

## 2017-12-25 ENCOUNTER — Telehealth: Payer: Self-pay | Admitting: Pulmonary Disease

## 2017-12-25 MED ORDER — PREDNISONE 20 MG PO TABS: 20.0000 mg | ORAL_TABLET | Freq: Every day | ORAL | 0 refills | Status: DC

## 2017-12-25 MED ORDER — AMOXICILLIN-POT CLAVULANATE 875-125 MG PO TABS: 1.0000 | ORAL_TABLET | Freq: Two times a day (BID) | ORAL | 0 refills | Status: DC

## 2017-12-25 NOTE — Telephone Encounter (Signed)
Spoke with the pt  She states that she finished her zpak and not improving  She is still coughing with yellow sputum, fatigue and rhinitis  She states BQ told her to call for a different abx if she was not improving  She denies any new symptoms or fever  Please advise, thanks!

## 2017-12-25 NOTE — Telephone Encounter (Signed)
Augmentin 875 bid x 5 days Prednisone 20mg  daily x 5 days

## 2017-12-25 NOTE — Telephone Encounter (Signed)
Spoke with pt. She is aware of BQ's recommendations. Rxs have been sent in. Nothing further was needed.

## 2018-01-22 DIAGNOSIS — M0579 Rheumatoid arthritis with rheumatoid factor of multiple sites without organ or systems involvement: Secondary | ICD-10-CM | POA: Diagnosis not present

## 2018-01-22 DIAGNOSIS — Z79899 Other long term (current) drug therapy: Secondary | ICD-10-CM | POA: Diagnosis not present

## 2018-01-23 DIAGNOSIS — M2550 Pain in unspecified joint: Secondary | ICD-10-CM | POA: Diagnosis not present

## 2018-01-23 DIAGNOSIS — M0579 Rheumatoid arthritis with rheumatoid factor of multiple sites without organ or systems involvement: Secondary | ICD-10-CM | POA: Diagnosis not present

## 2018-01-23 DIAGNOSIS — Z79899 Other long term (current) drug therapy: Secondary | ICD-10-CM | POA: Diagnosis not present

## 2018-01-23 DIAGNOSIS — Z6823 Body mass index (BMI) 23.0-23.9, adult: Secondary | ICD-10-CM | POA: Diagnosis not present

## 2018-01-23 DIAGNOSIS — J849 Interstitial pulmonary disease, unspecified: Secondary | ICD-10-CM | POA: Diagnosis not present

## 2018-01-31 ENCOUNTER — Ambulatory Visit (INDEPENDENT_AMBULATORY_CARE_PROVIDER_SITE_OTHER)
Admission: RE | Admit: 2018-01-31 | Discharge: 2018-01-31 | Disposition: A | Discharge status: Home / Self Care | Payer: Medicare Other | Source: Ambulatory Visit | Attending: Pulmonary Disease | Admitting: Pulmonary Disease

## 2018-01-31 ENCOUNTER — Encounter: Payer: Self-pay | Admitting: Pulmonary Disease

## 2018-01-31 ENCOUNTER — Other Ambulatory Visit (INDEPENDENT_AMBULATORY_CARE_PROVIDER_SITE_OTHER): Payer: Medicare Other

## 2018-01-31 ENCOUNTER — Ambulatory Visit (INDEPENDENT_AMBULATORY_CARE_PROVIDER_SITE_OTHER): Payer: Medicare Other | Admitting: Pulmonary Disease

## 2018-01-31 VITALS — BP 130/66 | HR 111 | Ht 64.0 in | Wt 139.6 lb

## 2018-01-31 DIAGNOSIS — Z01818 Encounter for other preprocedural examination: Secondary | ICD-10-CM

## 2018-01-31 DIAGNOSIS — J8410 Pulmonary fibrosis, unspecified: Secondary | ICD-10-CM

## 2018-01-31 DIAGNOSIS — R079 Chest pain, unspecified: Secondary | ICD-10-CM | POA: Diagnosis not present

## 2018-01-31 DIAGNOSIS — R0602 Shortness of breath: Secondary | ICD-10-CM | POA: Diagnosis not present

## 2018-01-31 DIAGNOSIS — J9611 Chronic respiratory failure with hypoxia: Secondary | ICD-10-CM

## 2018-01-31 DIAGNOSIS — Z5181 Encounter for therapeutic drug level monitoring: Secondary | ICD-10-CM | POA: Diagnosis not present

## 2018-01-31 DIAGNOSIS — J849 Interstitial pulmonary disease, unspecified: Secondary | ICD-10-CM | POA: Diagnosis not present

## 2018-01-31 DIAGNOSIS — I25119 Atherosclerotic heart disease of native coronary artery with unspecified angina pectoris: Secondary | ICD-10-CM | POA: Diagnosis not present

## 2018-01-31 LAB — CBC WITH DIFFERENTIAL/PLATELET
BASOS ABS: 0.1 10*3/uL (ref 0.0–0.1)
Basophils Relative: 0.9 % (ref 0.0–3.0)
EOS ABS: 0 10*3/uL (ref 0.0–0.7)
Eosinophils Relative: 0 % (ref 0.0–5.0)
HCT: 39.7 % (ref 36.0–46.0)
Hemoglobin: 13.3 g/dL (ref 12.0–15.0)
LYMPHS ABS: 2.9 10*3/uL (ref 0.7–4.0)
Lymphocytes Relative: 30.2 % (ref 12.0–46.0)
MCHC: 33.4 g/dL (ref 30.0–36.0)
MCV: 91 fl (ref 78.0–100.0)
Monocytes Absolute: 0.6 10*3/uL (ref 0.1–1.0)
Monocytes Relative: 6 % (ref 3.0–12.0)
NEUTROS ABS: 6 10*3/uL (ref 1.4–7.7)
Neutrophils Relative %: 62.9 % (ref 43.0–77.0)
PLATELETS: 187 10*3/uL (ref 150.0–400.0)
RBC: 4.37 Mil/uL (ref 3.87–5.11)
RDW: 13.9 % (ref 11.5–15.5)
WBC: 9.6 10*3/uL (ref 4.0–10.5)

## 2018-01-31 LAB — COMPREHENSIVE METABOLIC PANEL
ALK PHOS: 67 U/L (ref 39–117)
ALT: 14 U/L (ref 0–35)
AST: 18 U/L (ref 0–37)
Albumin: 3.7 g/dL (ref 3.5–5.2)
BILIRUBIN TOTAL: 0.6 mg/dL (ref 0.2–1.2)
BUN: 11 mg/dL (ref 6–23)
CALCIUM: 9.5 mg/dL (ref 8.4–10.5)
CO2: 30 mEq/L (ref 19–32)
Chloride: 102 mEq/L (ref 96–112)
Creatinine, Ser: 0.87 mg/dL (ref 0.40–1.20)
GFR: 79.52 mL/min (ref >60.00)
GLUCOSE: 91 mg/dL (ref 70–99)
POTASSIUM: 4.1 meq/L (ref 3.5–5.1)
Sodium: 139 mEq/L (ref 135–145)
Total Protein: 7.3 g/dL (ref 6.0–8.3)

## 2018-01-31 LAB — BASIC METABOLIC PANEL
BUN: 11 mg/dL (ref 6–23)
CALCIUM: 9.4 mg/dL (ref 8.4–10.5)
CO2: 31 meq/L (ref 19–32)
CREATININE: 0.86 mg/dL (ref 0.40–1.20)
Chloride: 102 mEq/L (ref 96–112)
GFR: 80.59 mL/min (ref >60.00)
GLUCOSE: 92 mg/dL (ref 70–99)
Potassium: 4.1 mEq/L (ref 3.5–5.1)
Sodium: 139 mEq/L (ref 135–145)

## 2018-01-31 MED ORDER — MYCOPHENOLATE MOFETIL 500 MG PO TABS: ORAL_TABLET | ORAL | 3 refills | Status: DC

## 2018-01-31 MED ORDER — IOPAMIDOL (ISOVUE-370) INJECTION 76%
80.0000 mL | Freq: Once | INTRAVENOUS | Status: AC | PRN
  Administered 2018-01-31: 80 mL via INTRAVENOUS

## 2018-01-31 NOTE — Patient Instructions (Signed)
For chest pain and shortness of breath: We will arrange for a test called a CT angiogram of your chest to make sure there is no evidence of a blood clot If this problem gets worse you need to go to the emergency room  For rheumatoid arthritis associated interstitial lung disease: Stop Imuran For now stop taking the sulfa antibiotic (Bactrim) until we have you start taking CellCept We are going to have you start taking a medicine called CellCept 500 mg twice a day x1 week then 1000 mg twice a day We will need to monitor your blood counts to make sure that this does not cause problems with your white blood cell count going down.  We will also monitor your kidney function and liver function.  The most common side effect from CellCept is diarrhea.  It can cause you to be at increased risk for infection so it will be important for you to resume the Bactrim when she start taking that medicine again.  For rheumatoid arthritis associated joint disease: Continue taking the infliximab as directed by Dr. Lenna Gilford  For chronic respiratory failure with hypoxemia: Continue 2 L of oxygen continuously  I would like for you to follow-up in 1 week with Barbaraann Barthel to make sure your shortness of breath is not worsening

## 2018-01-31 NOTE — Progress Notes (Signed)
Subjective:    Patient ID: [name] [name], female    DOB: January 07, 1931, 84 y.o.   MRN: [id number]  Synopsis: This is a very pleasant female who is diagnosed with interstitial lung disease in her mid 75s after evaluation at [facility] and [facility]. She was found to have a UIP pattern on CT scan and serology testing revealed a very high rheumatoid factor as well as anti-CCP antibody. She was followed by Dr. Elgie Collard at [facility] who felt like she had rheumatoid arthritis associated interstitial lung disease. He never treated her with immunosuppressive therapy. From 2000 07/09/2013 she's continued to follow in both [place] and due to her lung function has remained stable during that time. She has not developed new shortness of breath. She has had repeated episodes of pulmonary emboli. The most recent was in the summer of 2014 when she developed her fourth pulmonary embolism after returning from a trip to [place]. In 2018 she had worsening so in addition to infliximab she was treated with prednisone daily.  During that year she also started using oxygen 2 L continuously. Noted to have worsening fibrotic change in 2019 so we started Imuran.   HPI Chief Complaint  Patient presents with  . Follow-up    has good days and bad days, 2L pulse, panting more with breathing, Imuran is making her stomach upset, vomiting at times   [name] [name] says that the imuran has really made her sick to her stomach.  She says that she stopped taking the Imuran and the vomiting has gone away.  She says to that for the last several days she has had increasing shortness of breath as well as some chest tightness or pain.  She says that it is not a constant pressure-like pain it is more of a sharp pain.  She also noticed this morning that she was feeling a bit more weak and her heart rate has been elevated.  She has been using her oxygen continuously.  She denies new chest congestion or mucus  production but she says that she has still been coughing up mucus fairly regularly ever since she had influenza earlier this year.  She is still taking the Bactrim regularly.  Past Medical History:  Diagnosis Date  . Allergic rhinitis   . Allergy   . CAD (coronary artery disease)    Mild CAD by cath 2008  . DJD (degenerative joint disease)    rheumatoid  . DVT (deep venous thrombosis) ([place])   . Dyslipidemia   . History of nuclear stress test    Myoview 5/17:  EF 74%, normal perfusion, low risk  . History of pulmonary embolism   . Hyperlipidemia   . Insomnia   . Osteoporosis   . Pulmonary fibrosis ([place])   . Rheumatoid arthritis ([place])        Review of Systems  Constitutional: Negative for chills, fatigue and fever.  HENT: Negative for congestion, postnasal drip and rhinorrhea.   Respiratory: Positive for cough and chest tightness. Negative for shortness of breath and wheezing.   Cardiovascular: Negative for chest pain, palpitations and leg swelling.       Objective:   Physical Exam  Vitals:   01/31/18 1028  BP: 130/66  Pulse: (!) 111  SpO2: 91%  Weight: 139 lb 9.6 oz (63.3 kg)  Height: _0  (1.626 m)  2L O2  Gen: chronically ill appearing HENT: OP clear, TM's clear, neck supple PULM: Crackles bases bilaterally B, normal  percussion CV: RRR, no mgr, trace edema GI: BS+, soft, nontender Derm: no cyanosis or rash Psyche: normal mood and affect    July 2017 pulmonary function testing FVC 1.81 L 90% predicted, total lung capacity 3.79 L 72% predicted, DLCO 8.29 32% predicted. April 2018 forced vital capacity 1.81 L 90% predicted, DLCO 8.29 mL 32% predicted August 2018 FVC 1.88 L 95% predicted, DLCO 7.14 mL 27% predicted April 2019 FVC 1.67 L 86% predicted, DLCO 6.17 mL 24% predicted  6 min walk July 2017 6 minute walk distance 324 m, O2 saturation nadir 93% on room air  Labs:  12/18/2017 LFT: normal, CBC OK  BMET    Component Value Date/Time   NA 138  12/18/2017 1045   K 3.8 12/18/2017 1045   CL 104 12/18/2017 1045   CO2 26 12/18/2017 1045   GLUCOSE 89 12/18/2017 1045   BUN 11 12/18/2017 1045   CREATININE 0.92 12/18/2017 1045   CREATININE 0.74 11/30/2015 1327   CALCIUM 9.1 12/18/2017 1045   GFRNONAA 50 (L) 08/18/2017 2208   GFRAA 58 (L) 08/18/2017 2208        Assessment & Plan:   Therapeutic drug monitoring - Plan: Comp Met (CMET), CBC w/Diff  Chest pain, unspecified type - Plan: CT Angio Chest W/Cm &/Or Wo Cm  Chronic respiratory failure with hypoxia (HCC)  Postinflammatory pulmonary fibrosis (HCC)  Interstitial pulmonary disease ([place])  Discussion: [name] [name] was profoundly intolerant of Imuran and that it caused significant nausea and vomiting.  This has resolved since stopping Imuran so I believe that was the cause.  I explained to her today that I remain concerned about her worsening interstitial lung disease in the presence of rheumatoid arthritis.  Infliximab will not treat this condition.  An alternative would be CellCept which would basically provide no help in terms of her joint disease but hopefully would help slow the progression of her interstitial lung disease.  We will make a change to this medicine and will need to continue monitoring her blood counts and kidney function.  That all being said, I am concerned about the last 3 days of chest pain and shortness of breath with her increased heart rate today.  Her physical exam is not suggestive of a new or active infection.  I am concerned about the possibility of another blood clot as she is had this happen many times.  Plan: For chest pain and shortness of breath: We will arrange for a test called a CT angiogram of your chest to make sure there is no evidence of a blood clot If this problem gets worse you need to go to the emergency room  For rheumatoid arthritis associated interstitial lung disease: Stop Imuran For now stop taking the sulfa antibiotic (Bactrim) until  we have you start taking CellCept We are going to have you start taking a medicine called CellCept 500 mg twice a day x1 week then 1000 mg twice a day We will need to monitor your blood counts to make sure that this does not cause problems with your white blood cell count going down.  We will also monitor your kidney function and liver function.  The most common side effect from CellCept is diarrhea.  It can cause you to be at increased risk for infection so it will be important for you to resume the Bactrim when she start taking that medicine again.  For rheumatoid arthritis associated joint disease: Continue taking the infliximab as directed by Dr. Lenna Gilford  For chronic  respiratory failure with hypoxemia: Continue 2 L of oxygen continuously  I would like for you to follow-up in 1 week with Barbaraann Barthel to make sure your shortness of breath is not worsening   Current Outpatient Medications:  .  acetaminophen (TYLENOL) 500 MG tablet, Take 2 tablets (1,000 mg total) by mouth every 6 (six) hours as needed., Disp: 30 tablet, Rfl: 0 .  ALPRAZolam (XANAX) 0.25 MG tablet, Take 1 tablet (0.25 mg total) by mouth at bedtime as needed for anxiety., Disp: 15 tablet, Rfl: 0 .  aspirin EC 81 MG tablet, Take 1 tablet (81 mg total) by mouth daily., Disp: , Rfl:  .  atorvastatin (LIPITOR) 10 MG tablet, Take 1 tablet (10 mg total) by mouth daily., Disp: 90 tablet, Rfl: 3 .  cetirizine (ZYRTEC) 10 MG tablet, Take 5-10 mg by mouth daily as needed for allergies., Disp: , Rfl:  .  Cholecalciferol (VITAMIN D3) 2000 UNITS capsule, Take 1 capsule (2,000 Units total) by mouth daily., Disp: 100 capsule, Rfl: 3 .  enoxaparin (LOVENOX) 30 MG/0.3ML injection, Inject 0.3 mLs (30 mg total) into the skin every 12 (twelve) hours as needed (travel only)., Disp: 10 Syringe, Rfl: 0 .  fluticasone (FLONASE) 50 MCG/ACT nasal spray, Place 2 sprays into both nostrils daily as needed for allergies or rhinitis., Disp: 16 g, Rfl: 3 .   furosemide (LASIX) 20 MG tablet, Take 1 tablet by mouth daily as needed for swelling., Disp: 30 tablet, Rfl: 11 .  InFLIXimab (REMICADE IV), Inject into the vein., Disp: , Rfl:  .  latanoprost (XALATAN) 0.005 % ophthalmic solution, Place 1 drop into both eyes at bedtime. , Disp: , Rfl: 0 .  levalbuterol (XOPENEX HFA) 45 MCG/ACT inhaler, Inhale 2 puffs into the lungs every 6 (six) hours as needed for wheezing., Disp: 1 Inhaler, Rfl: 12 .  omega-3 acid ethyl esters (LOVAZA) 1 g capsule, Take 1 g by mouth daily., Disp: , Rfl:  .  ondansetron (ZOFRAN) 4 MG tablet, Take 1 tablet (4 mg total) by mouth every 8 (eight) hours as needed for nausea or vomiting., Disp: 20 tablet, Rfl: 0 .  Polyethyl Glycol-Propyl Glycol (SYSTANE) 0.4-0.3 % SOLN, Apply 1 drop to eye daily as needed (for dry eyes)., Disp: , Rfl:  .  predniSONE (DELTASONE) 5 MG tablet, Take 5 mg by mouth daily with breakfast., Disp: , Rfl:  .  saccharomyces boulardii (FLORASTOR) 250 MG capsule, Take 1 capsule (250 mg total) by mouth 2 (two) times daily., Disp: 60 capsule, Rfl: 0 .  sulfamethoxazole-trimethoprim (BACTRIM DS) 800-160 MG tablet, 1 tablet 3 times a week Monday, Wednesday, Friday., Disp: 30 tablet, Rfl: 1 .  isosorbide mononitrate (IMDUR) 30 MG 24 hr tablet, Take 0.5 tablets (15 mg total) daily by mouth., Disp: 45 tablet, Rfl: 3 .  mycophenolate (CELLCEPT) 500 MG tablet, 1 tab BID X1 week, then 2 tabs BID maintenance, Disp: 120 tablet, Rfl: 3

## 2018-01-31 NOTE — Addendum Note (Signed)
Addended by: Len Blalock on: 01/31/2018 11:31 AM   Modules accepted: Orders

## 2018-02-07 ENCOUNTER — Encounter (HOSPITAL_COMMUNITY): Payer: Self-pay | Admitting: Emergency Medicine

## 2018-02-07 ENCOUNTER — Other Ambulatory Visit: Payer: Self-pay

## 2018-02-07 ENCOUNTER — Encounter: Payer: Self-pay | Admitting: Nurse Practitioner

## 2018-02-07 ENCOUNTER — Encounter: Payer: Self-pay | Admitting: *Deleted

## 2018-02-07 ENCOUNTER — Ambulatory Visit (INDEPENDENT_AMBULATORY_CARE_PROVIDER_SITE_OTHER): Payer: Medicare Other | Admitting: Nurse Practitioner

## 2018-02-07 ENCOUNTER — Emergency Department (HOSPITAL_COMMUNITY)
Admission: EM | Admit: 2018-02-07 | Discharge: 2018-02-08 | Disposition: A | Discharge status: Home / Self Care | Payer: Medicare Other | Attending: Emergency Medicine | Admitting: Emergency Medicine

## 2018-02-07 VITALS — BP 96/64 | HR 87 | Ht 64.0 in | Wt 140.2 lb

## 2018-02-07 DIAGNOSIS — J9611 Chronic respiratory failure with hypoxia: Secondary | ICD-10-CM | POA: Diagnosis not present

## 2018-02-07 DIAGNOSIS — W0110XA Fall on same level from slipping, tripping and stumbling with subsequent striking against unspecified object, initial encounter: Secondary | ICD-10-CM | POA: Diagnosis not present

## 2018-02-07 DIAGNOSIS — I2510 Atherosclerotic heart disease of native coronary artery without angina pectoris: Secondary | ICD-10-CM | POA: Diagnosis not present

## 2018-02-07 DIAGNOSIS — Z86718 Personal history of other venous thrombosis and embolism: Secondary | ICD-10-CM | POA: Diagnosis not present

## 2018-02-07 DIAGNOSIS — Z86711 Personal history of pulmonary embolism: Secondary | ICD-10-CM | POA: Insufficient documentation

## 2018-02-07 DIAGNOSIS — Y92009 Unspecified place in unspecified non-institutional (private) residence as the place of occurrence of the external cause: Secondary | ICD-10-CM | POA: Diagnosis not present

## 2018-02-07 DIAGNOSIS — J301 Allergic rhinitis due to pollen: Secondary | ICD-10-CM

## 2018-02-07 DIAGNOSIS — I25119 Atherosclerotic heart disease of native coronary artery with unspecified angina pectoris: Secondary | ICD-10-CM | POA: Diagnosis not present

## 2018-02-07 DIAGNOSIS — S060X0A Concussion without loss of consciousness, initial encounter: Secondary | ICD-10-CM | POA: Diagnosis not present

## 2018-02-07 DIAGNOSIS — Z7901 Long term (current) use of anticoagulants: Secondary | ICD-10-CM | POA: Diagnosis not present

## 2018-02-07 DIAGNOSIS — Z5181 Encounter for therapeutic drug level monitoring: Secondary | ICD-10-CM

## 2018-02-07 DIAGNOSIS — S0990XA Unspecified injury of head, initial encounter: Secondary | ICD-10-CM | POA: Diagnosis not present

## 2018-02-07 DIAGNOSIS — R51 Headache: Secondary | ICD-10-CM | POA: Diagnosis not present

## 2018-02-07 DIAGNOSIS — J8410 Pulmonary fibrosis, unspecified: Secondary | ICD-10-CM | POA: Diagnosis not present

## 2018-02-07 DIAGNOSIS — I5032 Chronic diastolic (congestive) heart failure: Secondary | ICD-10-CM | POA: Diagnosis not present

## 2018-02-07 DIAGNOSIS — Z87891 Personal history of nicotine dependence: Secondary | ICD-10-CM | POA: Insufficient documentation

## 2018-02-07 DIAGNOSIS — Z96653 Presence of artificial knee joint, bilateral: Secondary | ICD-10-CM | POA: Diagnosis not present

## 2018-02-07 DIAGNOSIS — W19XXXA Unspecified fall, initial encounter: Secondary | ICD-10-CM | POA: Diagnosis not present

## 2018-02-07 DIAGNOSIS — Y999 Unspecified external cause status: Secondary | ICD-10-CM | POA: Diagnosis not present

## 2018-02-07 DIAGNOSIS — Z79899 Other long term (current) drug therapy: Secondary | ICD-10-CM | POA: Insufficient documentation

## 2018-02-07 DIAGNOSIS — Z7982 Long term (current) use of aspirin: Secondary | ICD-10-CM | POA: Insufficient documentation

## 2018-02-07 DIAGNOSIS — R0902 Hypoxemia: Secondary | ICD-10-CM | POA: Diagnosis not present

## 2018-02-07 DIAGNOSIS — Y9301 Activity, walking, marching and hiking: Secondary | ICD-10-CM | POA: Insufficient documentation

## 2018-02-07 DIAGNOSIS — R52 Pain, unspecified: Secondary | ICD-10-CM | POA: Diagnosis not present

## 2018-02-07 DIAGNOSIS — R55 Syncope and collapse: Secondary | ICD-10-CM | POA: Diagnosis not present

## 2018-02-07 NOTE — Assessment & Plan Note (Signed)
Continue current medications. 

## 2018-02-07 NOTE — Assessment & Plan Note (Signed)
Continue O2 at 2 L [place] 

## 2018-02-07 NOTE — ED Triage Notes (Signed)
Pt arriving via [facility] for a fall. Pt was walking in her living room, fell and hit her head on the doorway. Not on blood thinners. Denies LOC

## 2018-02-07 NOTE — Patient Instructions (Addendum)
Shortness of breath has not worsened - stable Continue current medications Patient has not received cellcept from mail order - please call when medication arrives to make a follow up appointment Start Cellcept 500 mg twice daily for one week. We will follow up at that time for labs and further instructions on medications. Continue Bactrim. We will call with further instruction on Bactrim.

## 2018-02-07 NOTE — Assessment & Plan Note (Signed)
Shortness of breath has not worsened - stable Continue current medications Patient has not received cellcept from mail order - please call when medication arrives to make a follow up appointment Start Cellcept 500 mg twice daily for one week. We will follow up at that time for labs and further instructions on medications. Continue Bactrim. We will call with further instruction on Bactrim.

## 2018-02-07 NOTE — Progress Notes (Signed)
Cmet, CBC    @Patient  ID: [name] [name], female    DOB: [birth year], 84 y.o.   MRN: [id number]  Chief Complaint  Patient presents with  . Follow-up    hasnt recieved Cellcept yet since it was [facility] to [facility] , her SOB is not worsening but it's not better    Referring provider: Hoyt Koch, *   HPI   84 year old female being seen today for a follow up on shortness of breath. States that she has been stable. Her sats were 97% on 2L. States that overall she feels well.   Note: At last visit she was ordered Cellcept, but has not received the medication through mail order yet. She was instructed at last OV to stop her Bactrim until she could start Cellcept, but she did not stop taking the Bactrim. Patient will start PTNS therapy next week at [facility] urology for urinary incontinence. She will have this therapy once weekly for a month and then once every three months.    Recent [name] [name] Pulmonary Encounters:   Plan from last visit 01-31-18:  For rheumatoid arthritis associated interstitial lung disease: Stop Imuran For now stop taking the sulfa antibiotic (Bactrim) until we have you start taking CellCept We are going to have you start taking a medicine called CellCept 500 mg twice a day x1 week then 1000 mg twice a day We will need to monitor your blood counts to make sure that this does not cause problems with your white blood cell count going down.  We will also monitor your kidney function and liver function.  The most common side effect from CellCept is diarrhea.  It can cause you to be at increased risk for infection so it will be important for you to resume the Bactrim when she start taking that medicine again.  For chronic respiratory failure with hypoxemia: Continue 2 L of oxygen continuously  I would like for you to follow-up in 1 week with Barbaraann Barthel to make sure your shortness of breath is not worsening    Allergies  Allergen Reactions  . Crestor  [Rosuvastatin] Other (See Comments)    Muscle pain  . Lactose Intolerance (Gi) Other (See Comments)    Stomach upset  . Penicillins Hives  . Imuran [Azathioprine] Nausea And Vomiting  . Niacin Hives    Immunization History  Administered Date(s) Administered  . Influenza Split 05/09/2011  . Influenza Whole 04/10/2010, 04/10/2012  . Influenza, High Dose Seasonal PF 05/07/2017  . Influenza,inj,Quad PF,6+ Mos 07/25/2013, 04/29/2014, 04/16/2015  . Influenza-Unspecified 04/24/2016  . Pneumococcal Conjugate-13 04/16/2015  . Pneumococcal Polysaccharide-23 07/26/2017  . Tdap 11/12/2013  . Zoster 07/11/2013    Past Medical History:  Diagnosis Date  . Allergic rhinitis   . Allergy   . CAD (coronary artery disease)    Mild CAD by cath 2008  . DJD (degenerative joint disease)    rheumatoid  . DVT (deep venous thrombosis) ([place])   . Dyslipidemia   . History of nuclear stress test    Myoview 5/17:  EF 74%, normal perfusion, low risk  . History of pulmonary embolism   . Hyperlipidemia   . Insomnia   . Osteoporosis   . Pulmonary fibrosis ([place])   . Rheumatoid arthritis ([place])     Tobacco History: Social History   Tobacco Use  Smoking Status Former Smoker  . Packs/day: 0.30  . Years: 20.00  . Pack years: 6.00  . Types: Cigarettes  . Last attempt to  quit: 07/11/1973  . Years since quitting: 44.6  Smokeless Tobacco Never Used   Counseling given: former smoker   Outpatient Encounter Medications as of 02/07/2018  Medication Sig  . acetaminophen (TYLENOL) 500 MG tablet Take 2 tablets (1,000 mg total) by mouth every 6 (six) hours as needed.  . ALPRAZolam (XANAX) 0.25 MG tablet Take 1 tablet (0.25 mg total) by mouth at bedtime as needed for anxiety.  [name] aspirin EC 81 MG tablet Take 1 tablet (81 mg total) by mouth daily.  [name] atorvastatin (LIPITOR) 10 MG tablet Take 1 tablet (10 mg total) by mouth daily.  . cetirizine (ZYRTEC) 10 MG tablet Take 5-10 mg by mouth daily as needed for  allergies.  . Cholecalciferol (VITAMIN D3) 2000 UNITS capsule Take 1 capsule (2,000 Units total) by mouth daily.  [name] enoxaparin (LOVENOX) 30 MG/0.3ML injection Inject 0.3 mLs (30 mg total) into the skin every 12 (twelve) hours as needed (travel only).  . fluticasone (FLONASE) 50 MCG/ACT nasal spray Place 2 sprays into both nostrils daily as needed for allergies or rhinitis.  . furosemide (LASIX) 20 MG tablet Take 1 tablet by mouth daily as needed for swelling.  . InFLIXimab (REMICADE IV) Inject into the vein.  [name] latanoprost (XALATAN) 0.005 % ophthalmic solution Place 1 drop into both eyes at bedtime.   . levalbuterol (XOPENEX HFA) 45 MCG/ACT inhaler Inhale 2 puffs into the lungs every 6 (six) hours as needed for wheezing.  . mycophenolate (CELLCEPT) 500 MG tablet 1 tab BID X1 week, then 2 tabs BID maintenance  . omega-3 acid ethyl esters (LOVAZA) 1 g capsule Take 1 g by mouth daily.  . ondansetron (ZOFRAN) 4 MG tablet Take 1 tablet (4 mg total) by mouth every 8 (eight) hours as needed for nausea or vomiting.  [name] Glycol-Propyl Glycol (SYSTANE) 0.4-0.3 % SOLN Apply 1 drop to eye daily as needed (for dry eyes).  . predniSONE (DELTASONE) 5 MG tablet Take 5 mg by mouth daily with breakfast.  . saccharomyces boulardii (FLORASTOR) 250 MG capsule Take 1 capsule (250 mg total) by mouth 2 (two) times daily.  [name] sulfamethoxazole-trimethoprim (BACTRIM DS) 800-160 MG tablet 1 tablet 3 times a week Monday, Wednesday, Friday.  . [DISCONTINUED] isosorbide mononitrate (IMDUR) 30 MG 24 hr tablet Take 0.5 tablets (15 mg total) daily by mouth.   No facility-administered encounter medications on file as of 02/07/2018.      Review of Systems  Review of Systems  Constitutional: Negative for activity change.  HENT: Negative.   Respiratory: Positive for shortness of breath. Negative for wheezing.   Skin: Negative.   Neurological: Negative.   Psychiatric/Behavioral: Negative.        Physical Exam  BP  96/64 (BP Location: Left Arm, Cuff Size: Normal)   Pulse 87   Ht 5\' 4"  (1.626 m)   Wt 140 lb 3.2 oz (63.6 kg)   SpO2 97%   BMI 24.07 kg/m   Wt Readings from Last 5 Encounters:  02/07/18 140 lb 3.2 oz (63.6 kg)  01/31/18 139 lb 9.6 oz (63.3 kg)  12/19/17 139 lb (63 kg)  12/19/17 140 lb (63.5 kg)  11/15/17 143 lb (64.9 kg)     Physical Exam  Constitutional: She is oriented to person, place, and time. She appears well-developed and well-nourished. No distress.  Cardiovascular: Normal rate and regular rhythm.  Pulmonary/Chest: Effort normal and breath sounds normal.  Neurological: She is alert and oriented to person, place, and time.  Psychiatric: She has a  normal mood and affect.  Nursing note and vitals reviewed.    Lab Results:  CBC    Component Value Date/Time   WBC 9.6 01/31/2018 1141   RBC 4.37 01/31/2018 1141   HGB 13.3 01/31/2018 1141   HCT 39.7 01/31/2018 1141   PLT 187.0 01/31/2018 1141   MCV 91.0 01/31/2018 1141   MCV 94.8 04/30/2013 1631   MCH 30.9 08/18/2017 2208   MCHC 33.4 01/31/2018 1141   RDW 13.9 01/31/2018 1141   LYMPHSABS 2.9 01/31/2018 1141   MONOABS 0.6 01/31/2018 1141   EOSABS 0.0 01/31/2018 1141   BASOSABS 0.1 01/31/2018 1141    BMET    Component Value Date/Time   NA 139 01/31/2018 1141   NA 139 01/31/2018 1141   K 4.1 01/31/2018 1141   K 4.1 01/31/2018 1141   CL 102 01/31/2018 1141   CL 102 01/31/2018 1141   CO2 31 01/31/2018 1141   CO2 30 01/31/2018 1141   GLUCOSE 92 01/31/2018 1141   GLUCOSE 91 01/31/2018 1141   BUN 11 01/31/2018 1141   BUN 11 01/31/2018 1141   CREATININE 0.86 01/31/2018 1141   CREATININE 0.87 01/31/2018 1141   CREATININE 0.74 11/30/2015 1327   CALCIUM 9.4 01/31/2018 1141   CALCIUM 9.5 01/31/2018 1141   GFRNONAA 50 (L) 08/18/2017 2208   GFRAA 58 (L) 08/18/2017 2208    BNP No results found for: BNP  ProBNP    Component Value Date/Time   PROBNP 41 11/13/2017 1508   PROBNP 55.0 10/13/2017 1653     Imaging: Ct Angio Chest W/cm &/or Wo Cm  Result Date: 01/31/2018 CLINICAL DATA:  84 year old female with increase shortness of breath and chest tightness since yesterday. Previous pulmonary emboli prior to 2014. EXAM: CT ANGIOGRAPHY CHEST WITH CONTRAST TECHNIQUE: Multidetector CT imaging of the chest was performed using the standard protocol during bolus administration of intravenous contrast. Multiplanar CT image reconstructions and MIPs were obtained to evaluate the vascular anatomy. CONTRAST:  32mL ISOVUE-370 IOPAMIDOL (ISOVUE-370) INJECTION 76% COMPARISON:  Chest CTA 12/01/2015 and earlier. FINDINGS: Cardiovascular: Good contrast bolus timing in the pulmonary arterial tree. Chronic poor enhancement of right lower lobe posterior basal segment arteries is unchanged over this series of exams. No acute focal filling defect identified in the pulmonary arteries to suggest acute pulmonary embolism. Chronic calcified coronary artery atherosclerosis. Mild tortuosity of the thoracic aorta is stable. Stable cardiac size at the upper limits of normal. No pericardial effusion. Mild calcified plaque in the aortic arch. Mediastinum/Nodes: Stable mild postinflammatory appearing anterior carina and hilar lymph nodes. No lymphadenopathy. Lungs/Pleura: Pulmonary fibrosis with basilar honeycombing. Stable lung volumes since 2017. The major airways remain patent. No superimposed acute pulmonary opacity or pleural effusion. Upper Abdomen: Chronic moderate to severe diverticulosis at the splenic flexure, no active inflammation is visible. Stable and negative other visible upper abdominal viscera. Musculoskeletal: Osteopenia.  Stable visualized osseous structures. Review of the MIP images confirms the above findings. IMPRESSION: 1. No evidence of acute pulmonary embolus. 2. Pulmonary Fibrosis with honeycombing. No acute pulmonary findings. 3. Chronic calcified coronary artery and to a lesser extent atherosclerosis.  Electronically Signed   By: Genevie Ann M.D.   On: 01/31/2018 14:15     Assessment & Plan:   Allergic rhinitis Continue current medications  Postinflammatory pulmonary fibrosis (HCC) Shortness of breath has not worsened - stable Continue current medications Patient has not received cellcept from mail order - please call when medication arrives to make a follow up appointment Start Cellcept  500 mg twice daily for one week. We will follow up at that time for labs and further instructions on medications. Continue Bactrim. We will call with further instruction on Bactrim.   Chronic respiratory failure with hypoxia ([place]) Continue O2 at 2L [place]     Fenton Foy, NP 02/07/2018

## 2018-02-08 ENCOUNTER — Emergency Department (HOSPITAL_COMMUNITY): Payer: Medicare Other

## 2018-02-08 DIAGNOSIS — R51 Headache: Secondary | ICD-10-CM | POA: Diagnosis not present

## 2018-02-08 DIAGNOSIS — S060X0A Concussion without loss of consciousness, initial encounter: Secondary | ICD-10-CM | POA: Diagnosis not present

## 2018-02-08 DIAGNOSIS — S0990XA Unspecified injury of head, initial encounter: Secondary | ICD-10-CM | POA: Diagnosis not present

## 2018-02-08 MED ORDER — ACETAMINOPHEN 325 MG PO TABS
650.0000 mg | ORAL_TABLET | Freq: Once | ORAL | Status: AC
  Administered 2018-02-08: 650 mg via ORAL
  Filled 2018-02-08: qty 2

## 2018-02-08 NOTE — Progress Notes (Signed)
Reviewed, agree 

## 2018-02-08 NOTE — ED Provider Notes (Signed)
[facility] DEPT Provider Note   CSN: [id number] Arrival date & time: 02/07/18  2330     History   Chief Complaint Chief Complaint  Patient presents with  . Fall    HPI [name] [name] is a 84 y.o. female.  The history is provided by the patient and a relative.  Fall  This is a new problem. Episode onset: just prior to arrival. The problem occurs constantly. The problem has not changed since onset.Associated symptoms include headaches. Pertinent negatives include no chest pain and no abdominal pain. Exacerbated by: Palpation. The symptoms are relieved by rest.  Patient reports that she fell at home.  She reports that she accidentally got tangled in her oxygen tubing and fell hitting her head.  No LOC. She reports headache, but no neck or back pain.  No vomiting.  She also reports left shoulder soreness. She does not take anticoagulants  Past Medical History:  Diagnosis Date  . Allergic rhinitis   . Allergy   . CAD (coronary artery disease)    Mild CAD by cath 2008  . DJD (degenerative joint disease)    rheumatoid  . DVT (deep venous thrombosis) ([place])   . Dyslipidemia   . History of nuclear stress test    Myoview 5/17:  EF 74%, normal perfusion, low risk  . History of pulmonary embolism   . Hyperlipidemia   . Insomnia   . Osteoporosis   . Pulmonary fibrosis ([place])   . Rheumatoid arthritis [facility])     Patient Active Problem List   Diagnosis Date Noted  . Chronic diastolic CHF (congestive heart failure) ([place]) 12/19/2017  . Influenza 08/24/2017  . Rash 08/11/2017  . Chronic respiratory failure with hypoxia ([place]) 03/30/2017  . Angular cheilitis 03/08/2017  . Leg pain 10/25/2016  . Back pain 08/05/2016  . RA (rheumatoid arthritis) ([place]) 03/31/2015  . Allergic rhinitis   . Varicose vein 09/11/2014  . Chest pain 02/27/2012  . CAD (coronary artery disease) 02/27/2012  . Postinflammatory pulmonary fibrosis ([place]) 06/30/2008  . CONSTIPATION  01/03/2008  . Dyslipidemia 06/16/2007  . Personal history of DVT (deep vein thrombosis) 06/16/2007    Past Surgical History:  Procedure Laterality Date  . ABDOMINAL HYSTERECTOMY  1974  . APPENDECTOMY  1959  . CATARACT EXTRACTION    . LEFT HEART CATHETERIZATION WITH CORONARY ANGIOGRAM N/A 06/18/2013   Procedure: LEFT HEART CATHETERIZATION WITH CORONARY ANGIOGRAM;  Surgeon: Peter M Martinique, MD;  Location: [facility] CATH LAB;  Service: Cardiovascular;  Laterality: N/A;  . TONSILLECTOMY  1941, 1951  . TOTAL KNEE ARTHROPLASTY  1997, 2007   Bilateral     OB History    Gravida  4   Para  2   Term  2   Preterm      AB      Living  0     SAB      TAB      Ectopic      Multiple      Live Births               Home Medications    Prior to Admission medications   Medication Sig Start Date End Date Taking? Authorizing Provider  acetaminophen (TYLENOL) 500 MG tablet Take 2 tablets (1,000 mg total) by mouth every 6 (six) hours as needed. 08/19/17   Charlesetta Shanks, MD  ALPRAZolam Duanne Moron) 0.25 MG tablet Take 1 tablet (0.25 mg total) by mouth at bedtime as needed for anxiety. 10/24/16  Hoyt Koch, MD  aspirin EC 81 MG tablet Take 1 tablet (81 mg total) by mouth daily. 11/30/15   Richardson Dopp T, PA-C  atorvastatin (LIPITOR) 10 MG tablet Take 1 tablet (10 mg total) by mouth daily. 12/27/16   Burnell Blanks, MD  cetirizine (ZYRTEC) 10 MG tablet Take 5-10 mg by mouth daily as needed for allergies.    [provider]  Cholecalciferol (VITAMIN D3) 2000 UNITS capsule Take 1 capsule (2,000 Units total) by mouth daily. 03/31/15   Plotnikov, Evie Lacks, MD  enoxaparin (LOVENOX) 30 MG/0.3ML injection Inject 0.3 mLs (30 mg total) into the skin every 12 (twelve) hours as needed (travel only). 10/24/16   Hoyt Koch, MD  fluticasone Asencion Islam) 50 MCG/ACT nasal spray Place 2 sprays into both nostrils daily as needed for allergies or rhinitis. 03/17/15   Hoyt Koch, MD  furosemide (LASIX) 20 MG tablet Take 1 tablet by mouth daily as needed for swelling. 06/15/17   Burnell Blanks, MD  InFLIXimab (REMICADE IV) Inject into the vein.    [provider]  latanoprost (XALATAN) 0.005 % ophthalmic solution Place 1 drop into both eyes at bedtime.  09/06/14   [provider]  levalbuterol Penne Lash HFA) 45 MCG/ACT inhaler Inhale 2 puffs into the lungs every 6 (six) hours as needed for wheezing. 06/05/17   Magdalen Spatz, NP  mycophenolate (CELLCEPT) 500 MG tablet 1 tab BID X1 week, then 2 tabs BID maintenance 01/31/18   Juanito Doom, MD  omega-3 acid ethyl esters (LOVAZA) 1 g capsule Take 1 g by mouth daily.    [provider]  ondansetron (ZOFRAN) 4 MG tablet Take 1 tablet (4 mg total) by mouth every 8 (eight) hours as needed for nausea or vomiting. 08/24/17   Hoyt Koch, MD  Polyethyl Glycol-Propyl Glycol (SYSTANE) 0.4-0.3 % SOLN Apply 1 drop to eye daily as needed (for dry eyes).    [provider]  predniSONE (DELTASONE) 5 MG tablet Take 5 mg by mouth daily with breakfast.    [provider]  saccharomyces boulardii (FLORASTOR) 250 MG capsule Take 1 capsule (250 mg total) by mouth 2 (two) times daily. 08/24/17   Hoyt Koch, MD  sulfamethoxazole-trimethoprim (BACTRIM DS) 800-160 MG tablet 1 tablet 3 times a week Monday, Wednesday, Friday. 11/15/17   Magdalen Spatz, NP    Family History Family History  Problem Relation Age of Onset  . Kidney disease Daughter 5  . Cancer Daughter   . Heart attack Father 16  . Alzheimer's disease Sister   . Heart disease Sister   . Alzheimer's disease Sister     Social History Social History   Tobacco Use  . Smoking status: Former Smoker    Packs/day: 0.30    Years: 20.00    Pack years: 6.00    Types: Cigarettes    Last attempt to quit: 07/11/1973    Years since quitting: 44.6  . Smokeless tobacco: Never Used  Substance Use Topics  .  Alcohol use: No  . Drug use: No     Allergies   Crestor [rosuvastatin]; Lactose intolerance (gi); Penicillins; Imuran [azathioprine]; and Niacin   Review of Systems Review of Systems  Constitutional: Negative for fever.  Cardiovascular: Negative for chest pain.  Gastrointestinal: Negative for abdominal pain.  Musculoskeletal: Negative for back pain and neck pain.  Neurological: Positive for headaches.  All other systems reviewed and are negative.    Physical Exam Updated Vital  Signs BP 100/77 (BP Location: Right Arm)   Pulse 97   Temp 98.1 F (36.7 C) (Oral)   Resp 16   Ht 1.626 m (5\' 4" )   Wt 63.5 kg (140 lb)   SpO2 91%   BMI 24.03 kg/m   Physical Exam CONSTITUTIONAL: Elderly, no acute distress HEAD: Diffuse scalp tenderness, no step-offs, no laceration EYES: EOMI/PERRL ENMT: Mucous membranes moist NECK: supple no meningeal signs SPINE/BACK:entire spine nontender CV: S1/S2 noted, no murmurs/rubs/gallops noted LUNGS: Brief crackles bilaterally Chest-no tenderness or bruising ABDOMEN: soft GU:no cva tenderness NEURO: Pt is awake/alert/appropriate, moves all extremitiesx4.  No facial droop.   EXTREMITIES: pulses normal/equal, full ROM, mild tenderness palpation of left shoulder, no deformity, no clavicular tenderness, All other extremities/joints palpated/ranged and nontender SKIN: warm, color normal PSYCH: no abnormalities of mood noted, alert and oriented to situation   ED Treatments / Results  Labs (all labs ordered are listed, but only abnormal results are displayed) Labs Reviewed - No data to display  EKG None  Radiology Ct Head Wo Contrast  Result Date: 02/08/2018 CLINICAL DATA:  Initial evaluation for acute headache status post fall. EXAM: CT HEAD WITHOUT CONTRAST TECHNIQUE: Contiguous axial images were obtained from the base of the skull through the vertex without intravenous contrast. COMPARISON:  Prior CT from 08/18/2017 FINDINGS: Brain:  Age-related cerebral atrophy with mild chronic small vessel ischemic disease. No acute intracranial hemorrhage. No acute large vessel territory infarct. No mass lesion, midline shift or mass effect. Mild ventricular prominence related global parenchymal volume loss of hydrocephalus. No extra-axial fluid collection. Vascular: No hyperdense vessel. Calcified atherosclerosis at the skull base. Skull: Scalp soft tissues within normal limits.  Calvarium intact. Sinuses/Orbits: Globes and orbital soft tissues demonstrate no acute abnormality. Air-fluid levels present within the sphenoid sinuses bilaterally. Paranasal sinuses are otherwise clear. No significant mastoid effusion. Other: None. IMPRESSION: 1. No acute intracranial abnormality. 2. Age-related cerebral atrophy with mild chronic small vessel ischemic disease. 3. Air-fluid levels within the sphenoid sinuses, suggesting acute sinusitis. Electronically Signed   By: Jeannine Boga M.D.   On: 02/08/2018 01:25    Procedures Procedures    Medications Ordered in ED Medications  acetaminophen (TYLENOL) tablet 650 mg (650 mg Oral Given 02/08/18 0102)     Initial Impression / Assessment and Plan / ED Course  I have reviewed the triage vital signs and the nursing notes.  Pertinent  imaging results that were available during my care of the patient were reviewed by me and considered in my medical decision making (see chart for details).     12:57 AM After discussion, patient agrees to receive CT head. She declines any other imaging 2:05 AM CT head is negative.  Patient feels improved and would like to be discharged home.  We discussed strict return precautions Of note, she usually is on oxygen as needed, but she declined use at this time Final Clinical Impressions(s) / ED Diagnoses   Final diagnoses:  Concussion without loss of consciousness, initial encounter    ED Discharge Orders    None       [place]y Fraise, MD 02/08/18 0205

## 2018-03-01 DIAGNOSIS — R351 Nocturia: Secondary | ICD-10-CM | POA: Diagnosis not present

## 2018-03-01 DIAGNOSIS — N3941 Urge incontinence: Secondary | ICD-10-CM | POA: Diagnosis not present

## 2018-03-08 DIAGNOSIS — N3941 Urge incontinence: Secondary | ICD-10-CM | POA: Diagnosis not present

## 2018-03-13 DIAGNOSIS — H0100B Unspecified blepharitis left eye, upper and lower eyelids: Secondary | ICD-10-CM | POA: Diagnosis not present

## 2018-03-13 DIAGNOSIS — H0100A Unspecified blepharitis right eye, upper and lower eyelids: Secondary | ICD-10-CM | POA: Diagnosis not present

## 2018-03-13 DIAGNOSIS — H04123 Dry eye syndrome of bilateral lacrimal glands: Secondary | ICD-10-CM | POA: Diagnosis not present

## 2018-03-15 DIAGNOSIS — N3941 Urge incontinence: Secondary | ICD-10-CM | POA: Diagnosis not present

## 2018-03-19 DIAGNOSIS — M0579 Rheumatoid arthritis with rheumatoid factor of multiple sites without organ or systems involvement: Secondary | ICD-10-CM | POA: Diagnosis not present

## 2018-03-19 DIAGNOSIS — Z79899 Other long term (current) drug therapy: Secondary | ICD-10-CM | POA: Diagnosis not present

## 2018-03-20 ENCOUNTER — Telehealth: Payer: Self-pay | Admitting: Pulmonary Disease

## 2018-03-20 NOTE — Telephone Encounter (Signed)
Patient was able to receive her medication. She would like to let BQ know that she now has it to start taking. Will route to BQ as FYI.

## 2018-03-20 NOTE — Telephone Encounter (Signed)
Great, thanks for update.

## 2018-03-22 DIAGNOSIS — N3941 Urge incontinence: Secondary | ICD-10-CM | POA: Diagnosis not present

## 2018-03-29 DIAGNOSIS — N3941 Urge incontinence: Secondary | ICD-10-CM | POA: Diagnosis not present

## 2018-04-05 DIAGNOSIS — N3941 Urge incontinence: Secondary | ICD-10-CM | POA: Diagnosis not present

## 2018-04-05 DIAGNOSIS — N3020 Other chronic cystitis without hematuria: Secondary | ICD-10-CM | POA: Diagnosis not present

## 2018-04-09 ENCOUNTER — Other Ambulatory Visit: Payer: Self-pay | Admitting: Acute Care

## 2018-04-10 NOTE — Telephone Encounter (Signed)
We received a refill for this patient's bactrim.  Office note on 01/31/18 BQ states stop Bactrim until start cellcept.   Office note 02/07/18 per Lazaro Arms NP says to continue Bactrim and we would call her later with instructions for Bactrim.  Dr. Lake Bells please advise on if the Bactrim should be refilled or not and if so what instructions go with the refill.   Patient received cellcept on 03/20/18

## 2018-04-11 NOTE — Telephone Encounter (Signed)
Find out if the patient is taking cellcept or imuran first, then we will address

## 2018-04-12 DIAGNOSIS — N3941 Urge incontinence: Secondary | ICD-10-CM | POA: Diagnosis not present

## 2018-04-16 DIAGNOSIS — H43813 Vitreous degeneration, bilateral: Secondary | ICD-10-CM | POA: Diagnosis not present

## 2018-04-16 DIAGNOSIS — H0100A Unspecified blepharitis right eye, upper and lower eyelids: Secondary | ICD-10-CM | POA: Diagnosis not present

## 2018-04-16 DIAGNOSIS — H5213 Myopia, bilateral: Secondary | ICD-10-CM | POA: Diagnosis not present

## 2018-04-16 DIAGNOSIS — H40013 Open angle with borderline findings, low risk, bilateral: Secondary | ICD-10-CM | POA: Diagnosis not present

## 2018-04-19 DIAGNOSIS — N3941 Urge incontinence: Secondary | ICD-10-CM | POA: Diagnosis not present

## 2018-04-23 ENCOUNTER — Ambulatory Visit (INDEPENDENT_AMBULATORY_CARE_PROVIDER_SITE_OTHER): Payer: Medicare Other | Admitting: Internal Medicine

## 2018-04-23 ENCOUNTER — Encounter: Payer: Self-pay | Admitting: Internal Medicine

## 2018-04-23 VITALS — BP 100/60 | HR 90 | Temp 98.2°F | Ht 64.0 in | Wt 138.0 lb

## 2018-04-23 DIAGNOSIS — L299 Pruritus, unspecified: Secondary | ICD-10-CM

## 2018-04-23 DIAGNOSIS — Z23 Encounter for immunization: Secondary | ICD-10-CM | POA: Diagnosis not present

## 2018-04-23 DIAGNOSIS — R21 Rash and other nonspecific skin eruption: Secondary | ICD-10-CM

## 2018-04-23 DIAGNOSIS — I25119 Atherosclerotic heart disease of native coronary artery with unspecified angina pectoris: Secondary | ICD-10-CM

## 2018-04-23 MED ORDER — TRIAMCINOLONE ACETONIDE 0.1 % EX CREA: 1.0000 "application " | TOPICAL_CREAM | Freq: Two times a day (BID) | CUTANEOUS | 0 refills | Status: DC

## 2018-04-23 MED ORDER — ALPRAZOLAM 0.25 MG PO TABS: 0.2500 mg | ORAL_TABLET | Freq: Every evening | ORAL | 0 refills | Status: DC | PRN

## 2018-04-23 NOTE — Progress Notes (Signed)
   Subjective:    Patient ID: [name] [name], female    DOB: Jan 24, 1931, 84 y.o.   MRN: [id number]  HPI The patient is an 84 YO female coming in for itching and red spots. She did start a probiotic about 2 weeks ago. No other medication changes. No diet changes. No new outdoor exposures. She started with some itching on the backs of her hands and arms. This has spread to her back and upper legs. No itching on soles or palms of hands/feet. Denies taking zyrtec recently. Denies taking benadryl for this. Has gotten 3 different creams to try and they help for about 1 hour or so. This all started about 1-2 weeks ago and is worsening overall.   Review of Systems  Constitutional: Negative.   Respiratory: Negative for cough, chest tightness and shortness of breath.   Cardiovascular: Negative for chest pain, palpitations and leg swelling.  Gastrointestinal: Negative for abdominal distention, abdominal pain, constipation, diarrhea, nausea and vomiting.  Musculoskeletal: Negative.   Skin: Positive for color change and rash.  Neurological: Negative.   Psychiatric/Behavioral: Negative.       Objective:   Physical Exam  Constitutional: She is oriented to person, place, and time. She appears well-developed and well-nourished.  HENT:  Head: Normocephalic and atraumatic.  Eyes: EOM are normal.  Neck: Normal range of motion.  Cardiovascular: Normal rate and regular rhythm.  Pulmonary/Chest: Effort normal and breath sounds normal. No respiratory distress. She has no wheezes. She has no rales.  Abdominal: Soft.  Musculoskeletal: She exhibits no edema.  Neurological: She is alert and oriented to person, place, and time. Coordination normal.  Skin: Skin is warm and dry. Rash noted.  Red macular lesions on the arms and back, none noted on stomach. Some scratching during exam.    Vitals:   04/23/18 1000  BP: 100/60  Pulse: 90  Temp: 98.2 F (36.8 C)  TempSrc: Oral  SpO2: 94%  Weight: 138 lb (62.6 kg)    Height: 5\' 4"  (1.626 m)      Assessment & Plan:  Flu shot given at visit

## 2018-04-23 NOTE — Patient Instructions (Signed)
I would recommend to take zyrtec 1 pill twice a day to help with the itching.   We have sent in benadryl to take 1 pill every 6 hours as needed for the itching.   We have sent in a strong cream to use twice a day for itching. It is okay to use benadryl cream as often as needed to help with itching.  Stop taking the probiotic.

## 2018-04-23 NOTE — Assessment & Plan Note (Signed)
Suspect due to new probiotic. Asked her to stop this. Take zyrtec BID and benadryl as needed for itching. Rx for triamcinolone cream to use on lesions and can use topical benadryl as often as needed. Call back if not improved in 1-2 weeks.

## 2018-04-24 ENCOUNTER — Encounter: Payer: Self-pay | Admitting: Internal Medicine

## 2018-04-24 NOTE — Assessment & Plan Note (Signed)
Suspect due to new probiotic. Rx for triamcinolone cream, can use benadryl oral and cream for itching. Stop the probiotic.

## 2018-04-25 ENCOUNTER — Telehealth: Payer: Self-pay | Admitting: Internal Medicine

## 2018-04-25 DIAGNOSIS — L299 Pruritus, unspecified: Secondary | ICD-10-CM

## 2018-04-25 NOTE — Telephone Encounter (Signed)
Copied from [place] [id number]. Topic: Referral - Request for Referral >> Apr 25, 2018  2:22 PM Ivar Drape wrote: Reason for CRM:   Patient would like to be referred to an Allergist because of her itchy bumps all over her body. Patient has seen her provider about this problems.  Please make referral to: Dr. Allena Katz, [phone].

## 2018-04-26 NOTE — Telephone Encounter (Signed)
Referral placed.

## 2018-04-26 NOTE — Telephone Encounter (Signed)
Patient informed referral placed.

## 2018-04-27 ENCOUNTER — Encounter: Payer: Self-pay | Admitting: Allergy

## 2018-04-27 ENCOUNTER — Ambulatory Visit (INDEPENDENT_AMBULATORY_CARE_PROVIDER_SITE_OTHER): Payer: Medicare Other | Admitting: Allergy

## 2018-04-27 VITALS — BP 94/54 | HR 96 | Temp 98.5°F | Resp 16 | Ht 62.0 in | Wt 133.0 lb

## 2018-04-27 DIAGNOSIS — L508 Other urticaria: Secondary | ICD-10-CM

## 2018-04-27 DIAGNOSIS — I25119 Atherosclerotic heart disease of native coronary artery with unspecified angina pectoris: Secondary | ICD-10-CM

## 2018-04-27 MED ORDER — FAMOTIDINE 20 MG PO TABS: 20.0000 mg | ORAL_TABLET | Freq: Two times a day (BID) | ORAL | 3 refills | Status: DC

## 2018-04-27 MED ORDER — CETIRIZINE HCL 10 MG PO TABS: 10.0000 mg | ORAL_TABLET | Freq: Two times a day (BID) | ORAL | 3 refills | Status: DC

## 2018-04-27 NOTE — Patient Instructions (Addendum)
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  Hives can also be autoimmune in nature and you have autoimmune disease with Rheumatoid arthritis which can make you at risk for autoimmune urticaria.  Your symptoms have been ongoing since May making this chronic thus will obtain labwork to evaluate: CBC w diff, CMP, tryptase, hive panel, environmental panel, alpha-gal panel   - increase zyrtec to 1 tablet twice a day (in AM and PM)   - start pepcid 20mg  1 tablet twice a day (in AM and PM) with the zyrtec   - can continue use of triamcinolone if this has helped topically with itch/rash   - discuss with Dr. Lake Bells regarding Cellcept for management of your pulmonary fibrosis.  Cellcept has been used to treat refractory hives thus believe if you start this medication for your pulmonary fibrosis management then you will have benefit from hive management as well.    Follow-up 3 months or sooner if needed

## 2018-04-27 NOTE — Progress Notes (Signed)
New Patient Note  RE: [name] [name] MRN: [id number] DOB: 1930/09/03 Date of Office Visit: 04/27/2018  Referring provider: Hoyt Koch, * Primary care provider: Hoyt Koch, MD  Chief Complaint: itchy rash  History of present illness: [name] [name] is a 84 y.o. female presenting today for consultation for pruritus.    She has been having an itchy rash.  She states she scratches sometimes until she breaks the skin and it bleeds.  In May she developed a red bumpy rash on her hands.  She states this went away and came back about 2 weeks ago.  However it came back "with a venengence".  It is now appearing on her arms, legs, back, abdomen.  The rash is extremely itchy now.  She has noticed increased joint aches/pains since rash has returned.  She denies any swelling with the rash.  She also states that the rash has been leaving some marks behind it likely due to the fact that she is breaking the skin and is scabbing.  She states the rash comes up and looks like a little pimple with redness.  She has not been able to identify any triggers of this rash and also has not identified any exacerbating factors. She saw her PCP recently for this rash.  She states she has tried benadryl but made her sleepy.   PCP recommended zyrtec and she was taking 1 tablet a day and was less itchy.  PCP also prescribed triamcinolone cream and she states it also has helped decrease the itch a bit.   She denies any new medications or change in her doses.  She denies any new foods, stings, change in soaps/lotions/detergents.  She does have a history of rheumatoid arthritis and pulmonary fibrosis secondary to UIP.  She does follow with pulmonary Dr. Lake Bells as well as her rheumatologist.  She is on prednisone 5 mg as a chronic daily medication for her rheumatoid arthritis.  She does use continuous supplemental oxygen.  Per her chart review and per patient report she is supposed to be on CellCept for her lung  disease however she states she had trouble getting the medication initially and then when she did get the medication she never started this.  She states she will be calling Dr. Anastasia Pall office today to discuss starting this medication.   Review of systems: Review of Systems  Constitutional: Negative for chills, fever and malaise/fatigue.  HENT: Negative for congestion, ear discharge, nosebleeds and sore throat.   Eyes: Negative for pain, discharge and redness.  Respiratory: Positive for shortness of breath. Negative for cough and wheezing.   Cardiovascular: Negative for chest pain.  Gastrointestinal: Negative for abdominal pain, constipation, diarrhea, heartburn, nausea and vomiting.  Musculoskeletal: Positive for joint pain.  Skin: Positive for itching and rash.  Neurological: Negative for headaches.    All other systems negative unless noted above in HPI  Past medical history: Past Medical History:  Diagnosis Date  . Allergic rhinitis   . Allergy   . CAD (coronary artery disease)    Mild CAD by cath 2008  . DJD (degenerative joint disease)    rheumatoid  . DVT (deep venous thrombosis) ([place])   . Dyslipidemia   . History of nuclear stress test    Myoview 5/17:  EF 74%, normal perfusion, low risk  . History of pulmonary embolism   . Hyperlipidemia   . Insomnia   . Osteoporosis   . Pulmonary fibrosis ([place])   . Rheumatoid  arthritis [facility])     Past surgical history: Past Surgical History:  Procedure Laterality Date  . ABDOMINAL HYSTERECTOMY  1974  . APPENDECTOMY  1959  . CATARACT EXTRACTION    . LEFT HEART CATHETERIZATION WITH CORONARY ANGIOGRAM N/A 06/18/2013   Procedure: LEFT HEART CATHETERIZATION WITH CORONARY ANGIOGRAM;  Surgeon: Peter M Martinique, MD;  Location: [facility] CATH LAB;  Service: Cardiovascular;  Laterality: N/A;  . TONSILLECTOMY  1941, 1951  . TOTAL KNEE ARTHROPLASTY  1997, 2007   Bilateral    Family history:  Family History  Problem Relation Age of Onset  .  Kidney disease Daughter 5  . Cancer Daughter   . Heart attack Father 64  . Alzheimer's disease Sister   . Heart disease Sister   . Alzheimer's disease Sister     Social history:  [name] Marital status: Widowed  . Number of children: 2  Occupational History  . Occupation: Publishing rights manager    Comment: retired  Tobacco Use  . Smoking status: Former Smoker    Packs/day: 0.30    Years: 20.00    Pack years: 6.00    Types: Cigarettes    Last attempt to quit: 07/11/1973    Years since quitting: 44.8  . Smokeless tobacco: Never Used  Substance and Sexual Activity  . Alcohol use: No  . Drug use: No  . Sexual activity: Never  Social History Narrative  Lives in a home with carpeting with gas and electric heating and central cooling.  There are no pets in the home.  There is no concern for water damage, mildew or roaches in the home.  Medication List: Allergies as of 04/27/2018      Reactions   Crestor [rosuvastatin] Other (See Comments)   Muscle pain   Lactose Intolerance (gi) Other (See Comments)   Stomach upset   Penicillins Hives   Imuran [azathioprine] Nausea And Vomiting   Niacin Hives      Medication List        Accurate as of 04/27/18  3:57 PM. Always use your most recent med list.          acetaminophen 500 MG tablet Commonly known as:  TYLENOL Take 2 tablets (1,000 mg total) by mouth every 6 (six) hours as needed.   ALPRAZolam 0.25 MG tablet Commonly known as:  XANAX Take 1 tablet (0.25 mg total) by mouth at bedtime as needed for anxiety.   aspirin EC 81 MG tablet Take 1 tablet (81 mg total) by mouth daily.   atorvastatin 10 MG tablet Commonly known as:  LIPITOR Take 1 tablet (10 mg total) by mouth daily.   cetirizine 10 MG tablet Commonly known as:  ZYRTEC Take 1 tablet (10 mg total) by mouth 2 (two) times daily.   enoxaparin 30 MG/0.3ML injection Commonly known as:  LOVENOX Inject 0.3 mLs (30 mg total) into the skin every 12 (twelve) hours  as needed (travel only).   famotidine 20 MG tablet Commonly known as:  PEPCID Take 1 tablet (20 mg total) by mouth 2 (two) times daily.   fluticasone 50 MCG/ACT nasal spray Commonly known as:  FLONASE Place 2 sprays into both nostrils daily as needed for allergies or rhinitis.   furosemide 20 MG tablet Commonly known as:  LASIX Take 1 tablet by mouth daily as needed for swelling.   latanoprost 0.005 % ophthalmic solution Commonly known as:  XALATAN Place 1 drop into both eyes at bedtime.   levalbuterol 45 MCG/ACT inhaler Commonly known as:  XOPENEX HFA Inhale 2 puffs into the lungs every 6 (six) hours as needed for wheezing.   mycophenolate 500 MG tablet Commonly known as:  CELLCEPT 1 tab BID X1 week, then 2 tabs BID maintenance   omega-3 acid ethyl esters 1 g capsule Commonly known as:  LOVAZA Take 1 g by mouth daily.   ondansetron 4 MG tablet Commonly known as:  ZOFRAN Take 1 tablet (4 mg total) by mouth every 8 (eight) hours as needed for nausea or vomiting.   predniSONE 5 MG tablet Commonly known as:  DELTASONE Take 5 mg by mouth daily with breakfast.   REMICADE IV Inject into the vein.   saccharomyces boulardii 250 MG capsule Commonly known as:  FLORASTOR Take 1 capsule (250 mg total) by mouth 2 (two) times daily.   sulfamethoxazole-trimethoprim 800-160 MG tablet Commonly known as:  BACTRIM DS,SEPTRA DS TAKE 1 TABLET BY MOUTH THREE TIMES A WEEK ON MONDAY, WEDNESDAY, AND FRIDAY.   SYSTANE 0.4-0.3 % Soln Generic drug:  Polyethyl Glycol-Propyl Glycol Apply 1 drop to eye daily as needed (for dry eyes).   triamcinolone cream 0.1 % Commonly known as:  KENALOG Apply 1 application topically 2 (two) times daily.   Vitamin D3 2000 units capsule Take 1 capsule (2,000 Units total) by mouth daily.       Known medication allergies: Allergies  Allergen Reactions  . Crestor [Rosuvastatin] Other (See Comments)    Muscle pain  . Lactose Intolerance (Gi) Other  (See Comments)    Stomach upset  . Penicillins Hives  . Imuran [Azathioprine] Nausea And Vomiting  . Niacin Hives     Physical examination: Blood pressure (!) 94/54, pulse 96, temperature 98.5 F (36.9 C), temperature source Oral, resp. rate 16, height 5\' 2"  (1.575 m), weight 133 lb (60.3 kg), SpO2 94 %.  General: Alert, interactive, in no acute distress. HEENT: PERRLA, TMs pearly gray, turbinates non-edematous without discharge, post-pharynx non erythematous. Neck: Supple without lymphadenopathy. Lungs: Clear to auscultation without wheezing, rhonchi or rales. {no increased work of breathing. CV: Normal S1, S2 without murmurs. Abdomen: Nondistended, nontender. Skin: Scattered erythematous urticarial type lesions primarily located Fingers and hands extending up to the arms bilaterally , nonvesicular. Extremities:  No clubbing, cyanosis or edema. Neuro:   Grossly intact.  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  Hives can also be autoimmune in nature and you have autoimmune disease with Rheumatoid arthritis which can make you at risk for autoimmune urticaria.  Your symptoms have been ongoing since May making this chronic thus will obtain labwork to evaluate: CBC w diff, CMP, tryptase, hive panel, environmental panel, alpha-gal panel   - increase zyrtec to 1 tablet twice a day (in AM and PM)   - start pepcid 20mg  1 tablet twice a day (in AM and PM) with the zyrtec   - can continue use of triamcinolone if this has helped topically with itch/rash   - discuss with Dr. Lake Bells regarding Cellcept for management  of your pulmonary fibrosis.  Cellcept has been used to treat refractory hives thus believe if you start this medication for your pulmonary fibrosis management then you will have benefit  from hive management as well.    Follow-up 3 months or sooner if needed  I appreciate the opportunity to take part in Lou's care. Please do not hesitate to contact me with questions.  Sincerely,   Prudy Feeler, MD Allergy/Immunology [facility]

## 2018-05-03 DIAGNOSIS — N3941 Urge incontinence: Secondary | ICD-10-CM | POA: Diagnosis not present

## 2018-05-04 LAB — CMP14+EGFR
A/G RATIO: 1.2 (ref 1.2–2.2)
ALK PHOS: 77 IU/L (ref 39–117)
ALT: 13 IU/L (ref 0–32)
AST: 18 IU/L (ref 0–40)
Albumin: 3.7 g/dL (ref 3.5–4.7)
BILIRUBIN TOTAL: 0.7 mg/dL (ref 0.0–1.2)
BUN/Creatinine Ratio: 11 — ABNORMAL LOW (ref 12–28)
BUN: 9 mg/dL (ref 8–27)
CALCIUM: 9.5 mg/dL (ref 8.7–10.3)
CHLORIDE: 104 mmol/L (ref 96–106)
CO2: 26 mmol/L (ref 20–29)
Creatinine, Ser: 0.84 mg/dL (ref 0.57–1.00)
GFR calc Af Amer: 73 mL/min/{1.73_m2} (ref >59)
GFR, EST NON AFRICAN AMERICAN: 64 mL/min/{1.73_m2} (ref >59)
GLOBULIN, TOTAL: 3.2 g/dL (ref 1.5–4.5)
Glucose: 80 mg/dL (ref 65–99)
POTASSIUM: 4.5 mmol/L (ref 3.5–5.2)
SODIUM: 143 mmol/L (ref 134–144)
Total Protein: 6.9 g/dL (ref 6.0–8.5)

## 2018-05-04 LAB — ALLERGENS W/TOTAL IGE AREA 2
Aspergillus Fumigatus IgE: <0.1 kU/L
Bermuda Grass IgE: <0.1 kU/L
Cedar, Mountain IgE: <0.1 kU/L
Cockroach, German IgE: <0.1 kU/L
Cottonwood IgE: <0.1 kU/L
D Farinae IgE: <0.1 kU/L
D Pteronyssinus IgE: <0.1 kU/L
Dog Dander IgE: <0.1 kU/L
IgE (Immunoglobulin E), Serum: 88 IU/mL (ref 6–495)
Maple/Box Elder IgE: <0.1 kU/L
Oak, White IgE: <0.1 kU/L
Pecan, Hickory IgE: <0.1 kU/L
Penicillium Chrysogen IgE: <0.1 kU/L
Ragweed, Short IgE: <0.1 kU/L

## 2018-05-04 LAB — ALPHA-GAL PANEL
Class Interpretation: 0
LAMB CLASS INTERPRETATION: 0
PORK CLASS INTERPRETATION: 0
Pork (Sus spp) IgE: <0.1 kU/L (ref <0.35)

## 2018-05-04 LAB — CBC WITH DIFFERENTIAL
BASOS ABS: 0 10*3/uL (ref 0.0–0.2)
Basos: 0 %
EOS (ABSOLUTE): 0.1 10*3/uL (ref 0.0–0.4)
Eos: 1 %
HEMOGLOBIN: 12.9 g/dL (ref 11.1–15.9)
Hematocrit: 39.4 % (ref 34.0–46.6)
Immature Grans (Abs): 0 10*3/uL (ref 0.0–0.1)
Immature Granulocytes: 0 %
Lymphocytes Absolute: 3.7 10*3/uL — ABNORMAL HIGH (ref 0.7–3.1)
Lymphs: 36 %
MCH: 29.9 pg (ref 26.6–33.0)
MCHC: 32.7 g/dL (ref 31.5–35.7)
MCV: 91 fL (ref 79–97)
Monocytes Absolute: 0.9 10*3/uL (ref 0.1–0.9)
Monocytes: 9 %
NEUTROS ABS: 5.3 10*3/uL (ref 1.4–7.0)
NEUTROS PCT: 54 %
RBC: 4.32 x10E6/uL (ref 3.77–5.28)
RDW: 12.2 % — ABNORMAL LOW (ref 12.3–15.4)
WBC: 10 10*3/uL (ref 3.4–10.8)

## 2018-05-04 LAB — CHRONIC URTICARIA

## 2018-05-04 LAB — TRYPTASE: TRYPTASE: 5.1 ug/L (ref 2.2–13.2)

## 2018-05-08 ENCOUNTER — Telehealth: Payer: Self-pay | Admitting: *Deleted

## 2018-05-08 NOTE — Telephone Encounter (Signed)
Would also like another blood test taking all medications. Itching really bad.

## 2018-05-08 NOTE — Telephone Encounter (Signed)
Please have her start singulair 10mg  daily.    Do we know if she started the Cellcept for her pulmonary disease?  She would like another blood test?

## 2018-05-08 NOTE — Telephone Encounter (Signed)
Called patient to review lab results patient states she is still itching and breaking out Dr Nelva Bush please advise

## 2018-05-09 MED ORDER — MONTELUKAST SODIUM 10 MG PO TABS: 10.0000 mg | ORAL_TABLET | Freq: Every day | ORAL | 2 refills | Status: DC

## 2018-05-09 NOTE — Telephone Encounter (Signed)
Spoke to patient advised as written per Dr Nelva Bush she verbalized understanding and will start medication

## 2018-05-09 NOTE — Telephone Encounter (Signed)
Yes I advised her at last visit that if Cellcept if what her pulmonogist wants her to be on that it should also help with his skin symptoms.  So yes she should start this if pulmonogist wants her on this medication.   I am not sure what additonal testing she would like done.  From my standpoint there are no additional labs that need to be performed.

## 2018-05-09 NOTE — Telephone Encounter (Signed)
Spoke to patient advised we would send in montelukast. Patient has not started Cellcept wants to know is she should start it states she was wanting the itching to get under control first. Dr Nelva Bush please advise if patient should start Cellcept  Additional blood work was patient's idea states there is something else going on. Thinks labs we did are wrong and would like more done

## 2018-05-09 NOTE — Telephone Encounter (Signed)
Returning your phone call [phone].

## 2018-05-09 NOTE — Telephone Encounter (Signed)
Left message to return call 

## 2018-05-10 DIAGNOSIS — N3941 Urge incontinence: Secondary | ICD-10-CM | POA: Diagnosis not present

## 2018-05-18 DIAGNOSIS — R21 Rash and other nonspecific skin eruption: Secondary | ICD-10-CM | POA: Diagnosis not present

## 2018-05-18 DIAGNOSIS — L408 Other psoriasis: Secondary | ICD-10-CM | POA: Diagnosis not present

## 2018-05-22 ENCOUNTER — Telehealth: Payer: Self-pay | Admitting: Pulmonary Disease

## 2018-05-22 NOTE — Telephone Encounter (Signed)
Called and spoke with pt who stated she broke in hives the first time in May and the hives went away for about a month and then she broke out again.  Pt stated this current time with the hive breakout, she is broken out on all extremities and she stated she has been to a dermatologist as well as many other doctors including an allergist and have even had the areas biopsied.  Pt states she is in a lot of pain due to the breakout and she stated she was told it could be coming from a medication she is on but she is unsure which medication it could be coming from.  Pt wonders if the hives could be coming from her Bactrim DS but she is unsure if it is that med or not.  Pt stated she was just prescribed montelukast by an allergist to see if it would help with her symptoms. I advised pt we should get her scheduled for an appt instead of trying to figure this out over the phone. Pt expressed understanding. Scheduled pt an OV with Lazaro Arms, NP tomorrow, 11/13 at 3pm. Nothing further needed.

## 2018-05-23 ENCOUNTER — Ambulatory Visit (INDEPENDENT_AMBULATORY_CARE_PROVIDER_SITE_OTHER): Payer: Medicare Other | Admitting: Nurse Practitioner

## 2018-05-23 ENCOUNTER — Other Ambulatory Visit (INDEPENDENT_AMBULATORY_CARE_PROVIDER_SITE_OTHER): Payer: Medicare Other

## 2018-05-23 ENCOUNTER — Encounter: Payer: Self-pay | Admitting: Nurse Practitioner

## 2018-05-23 VITALS — BP 118/70 | HR 99 | Ht 64.0 in | Wt 132.2 lb

## 2018-05-23 DIAGNOSIS — E876 Hypokalemia: Secondary | ICD-10-CM | POA: Diagnosis not present

## 2018-05-23 DIAGNOSIS — R21 Rash and other nonspecific skin eruption: Secondary | ICD-10-CM | POA: Diagnosis not present

## 2018-05-23 DIAGNOSIS — Z5181 Encounter for therapeutic drug level monitoring: Secondary | ICD-10-CM

## 2018-05-23 DIAGNOSIS — J8410 Pulmonary fibrosis, unspecified: Secondary | ICD-10-CM

## 2018-05-23 LAB — CBC WITH DIFFERENTIAL/PLATELET
BASOS ABS: 0.1 10*3/uL (ref 0.0–0.1)
BASOS PCT: 0.5 % (ref 0.0–3.0)
EOS ABS: 0.1 10*3/uL (ref 0.0–0.7)
Eosinophils Relative: 1.4 % (ref 0.0–5.0)
HCT: 41.4 % (ref 36.0–46.0)
Hemoglobin: 13.7 g/dL (ref 12.0–15.0)
Lymphocytes Relative: 40.1 % (ref 12.0–46.0)
Lymphs Abs: 4.2 10*3/uL — ABNORMAL HIGH (ref 0.7–4.0)
MCHC: 33.1 g/dL (ref 30.0–36.0)
MCV: 90.6 fl (ref 78.0–100.0)
MONO ABS: 1.1 10*3/uL — AB (ref 0.1–1.0)
Monocytes Relative: 10.7 % (ref 3.0–12.0)
NEUTROS ABS: 5 10*3/uL (ref 1.4–7.7)
Neutrophils Relative %: 47.3 % (ref 43.0–77.0)
PLATELETS: 191 10*3/uL (ref 150.0–400.0)
RBC: 4.57 Mil/uL (ref 3.87–5.11)
RDW: 13.6 % (ref 11.5–15.5)
WBC: 10.5 10*3/uL (ref 4.0–10.5)

## 2018-05-23 LAB — BASIC METABOLIC PANEL
BUN: 12 mg/dL (ref 6–23)
CALCIUM: 9.3 mg/dL (ref 8.4–10.5)
CHLORIDE: 104 meq/L (ref 96–112)
CO2: 31 meq/L (ref 19–32)
CREATININE: 0.89 mg/dL (ref 0.40–1.20)
GFR: 77.41 mL/min (ref >60.00)
Glucose, Bld: 93 mg/dL (ref 70–99)
Potassium: 3.4 mEq/L — ABNORMAL LOW (ref 3.5–5.1)
Sodium: 141 mEq/L (ref 135–145)

## 2018-05-23 NOTE — Patient Instructions (Addendum)
Continue current medications Will check labs - patient is on Cellcept and bactrim Continue kenalog cream, Singulair, and zyrtec for rash Cool compresses to rash Follow up with Dr. Lake Bells in around 4 weeks

## 2018-05-23 NOTE — Assessment & Plan Note (Signed)
Patient Instructions  Continue current medications Will check labs - patient is on Cellcept and bactrim Continue kenalog cream, Singulair, and zyrtec for rash Cool compresses to rash Follow up with Dr. Lake Bells in around 4 weeks

## 2018-05-23 NOTE — Progress Notes (Signed)
@Patient  ID: [name] [name], female    DOB: 01-31-1931, 84 y.o.   MRN: [id number]  Chief Complaint  Patient presents with  . Urticaria    Referring provider: Hoyt Koch, *  Synopsis: This is a very pleasant female who is diagnosed with interstitial lung disease in her mid 35s after evaluation at [facility] and [facility]. She was found to have a UIP pattern on CT scan and serology testing revealed a very high rheumatoid factor as well as anti-CCP antibody. She was followed by Dr. Elgie Collard at [facility] who felt like she had rheumatoid arthritis associated interstitial lung disease. He never treated her with immunosuppressive therapy. From 2000 07/09/2013 she's continued to follow in both [place] and due to her lung function has remained stable during that time. She has not developed new shortness of breath. She has had repeated episodes of pulmonary emboli. The most recent was in the summer of 2014 when she developed her fourth pulmonary embolism after returning from a trip to [place]. In 2018 she had worsening so in addition to infliximab she was treated with prednisone daily.  During that year she also started using oxygen 2 L continuously. Noted to have worsening fibrotic change in 2019 so we started Imuran. Stopped imuran due to nausea and started Cellcept in September 7525  HPI 84 year old female with postinflammatory pulmonary fibrosis and chronic respiratory failure followed by Dr. Lake Bells.  Tests:  PFT Results Latest Ref Rng & Units 10/13/2017 01/30/2017 01/15/2016 05/27/2015 04/29/2014  FVC-Pre L 1.62 1.89 2.13 1.94 1.93  FVC-Predicted Pre % 83 95 106 95 72  FVC-Post L 1.67 1.88 1.81 1.81 1.79  FVC-Predicted Post % 86 95 90 88 67  Pre FEV1/FVC % % 81 79 78 83 80  Post FEV1/FCV % % 81 85 78 87 86  FEV1-Pre L 1.32 1.50 1.65 1.61 1.55  FEV1-Predicted Pre % 87 97 105 101 78  FEV1-Post L 1.36 1.61 1.41 1.58 1.54  DLCO UNC% % 24  27 32 34 35  DLCO COR %Predicted % 46 50 47 60 65  TLC L 3.64 3.37 3.79 3.38 3.27  TLC % Predicted % 70 64 72 64 62  RV % Predicted % 66 61 70 54 56    OV 05/23/18 - Acute - rash Patient presents with rash to bilateral arms and legs. She states that she has already seen her allergist and dermatologist. She has had biopsies performed. She states that the rash started about 2 months ago. States that the rash itches and hurts. She has been prescribed kenalog cream without relief. She is concerned that one of her medications could be causing the rash. She denies any fever, chest pain, or edema.      Allergies  Allergen Reactions  . Crestor [Rosuvastatin] Other (See Comments)    Muscle pain  . Lactose Intolerance (Gi) Other (See Comments)    Stomach upset  . Penicillins Hives  . Imuran [Azathioprine] Nausea And Vomiting  . Niacin Hives    Immunization History  Administered Date(s) Administered  . Influenza Split 05/09/2011  . Influenza Whole 04/10/2010, 04/10/2012  . Influenza, High Dose Seasonal PF 05/07/2017, 04/23/2018  . Influenza,inj,Quad PF,6+ Mos 07/25/2013, 04/29/2014, 04/16/2015  . Influenza-Unspecified 04/24/2016  . Pneumococcal Conjugate-13 04/16/2015  . Pneumococcal Polysaccharide-23 07/26/2017  . Tdap 11/12/2013  . Zoster 07/11/2013    Past Medical History:  Diagnosis Date  . Allergic rhinitis   . Allergy   .  CAD (coronary artery disease)    Mild CAD by cath 2008  . DJD (degenerative joint disease)    rheumatoid  . DVT (deep venous thrombosis) ([place])   . Dyslipidemia   . History of nuclear stress test    Myoview 5/17:  EF 74%, normal perfusion, low risk  . History of pulmonary embolism   . Hyperlipidemia   . Insomnia   . Osteoporosis   . Pulmonary fibrosis ([place])   . Rheumatoid arthritis ([place])     Tobacco History: Social History   Tobacco Use  Smoking Status Former Smoker  . Packs/day: 0.30  . Years: 20.00  . Pack years: 6.00  . Types:  Cigarettes  . Last attempt to quit: 07/11/1973  . Years since quitting: 44.8  Smokeless Tobacco Never Used   Counseling given: Not Answered   Outpatient Encounter Medications as of 05/23/2018  Medication Sig  . acetaminophen (TYLENOL) 500 MG tablet Take 2 tablets (1,000 mg total) by mouth every 6 (six) hours as needed.  . ALPRAZolam (XANAX) 0.25 MG tablet Take 1 tablet (0.25 mg total) by mouth at bedtime as needed for anxiety.  [name] aspirin EC 81 MG tablet Take 1 tablet (81 mg total) by mouth daily.  [name] atorvastatin (LIPITOR) 10 MG tablet Take 1 tablet (10 mg total) by mouth daily.  . cetirizine (ZYRTEC) 10 MG tablet Take 1 tablet (10 mg total) by mouth 2 (two) times daily.  . Cholecalciferol (VITAMIN D3) 2000 UNITS capsule Take 1 capsule (2,000 Units total) by mouth daily.  [name] enoxaparin (LOVENOX) 30 MG/0.3ML injection Inject 0.3 mLs (30 mg total) into the skin every 12 (twelve) hours as needed (travel only).  . famotidine (PEPCID) 20 MG tablet Take 1 tablet (20 mg total) by mouth 2 (two) times daily.  . fluticasone (FLONASE) 50 MCG/ACT nasal spray Place 2 sprays into both nostrils daily as needed for allergies or rhinitis.  . furosemide (LASIX) 20 MG tablet Take 1 tablet by mouth daily as needed for swelling.  . InFLIXimab (REMICADE IV) Inject into the vein.  [name] latanoprost (XALATAN) 0.005 % ophthalmic solution Place 1 drop into both eyes at bedtime.   . levalbuterol (XOPENEX HFA) 45 MCG/ACT inhaler Inhale 2 puffs into the lungs every 6 (six) hours as needed for wheezing.  . montelukast (SINGULAIR) 10 MG tablet Take 1 tablet (10 mg total) by mouth at bedtime.  . mycophenolate (CELLCEPT) 500 MG tablet 1 tab BID X1 week, then 2 tabs BID maintenance  . omega-3 acid ethyl esters (LOVAZA) 1 g capsule Take 1 g by mouth daily.  . ondansetron (ZOFRAN) 4 MG tablet Take 1 tablet (4 mg total) by mouth every 8 (eight) hours as needed for nausea or vomiting.  [name] Glycol-Propyl Glycol (SYSTANE)  0.4-0.3 % SOLN Apply 1 drop to eye daily as needed (for dry eyes).  . predniSONE (DELTASONE) 5 MG tablet Take 5 mg by mouth daily with breakfast.  . saccharomyces boulardii (FLORASTOR) 250 MG capsule Take 1 capsule (250 mg total) by mouth 2 (two) times daily.  [name] sulfamethoxazole-trimethoprim (BACTRIM DS,SEPTRA DS) 800-160 MG tablet TAKE 1 TABLET BY MOUTH THREE TIMES A WEEK ON MONDAY, WEDNESDAY, AND FRIDAY.  [name] triamcinolone cream (KENALOG) 0.1 % Apply 1 application topically 2 (two) times daily.   No facility-administered encounter medications on file as of 05/23/2018.      Review of Systems  Review of Systems  Constitutional: Negative.  Negative for chills and fever.  HENT: Negative.   Respiratory: Negative  for cough, shortness of breath and wheezing.   Cardiovascular: Negative.  Negative for chest pain, palpitations and leg swelling.  Gastrointestinal: Negative.   Skin: Positive for rash.  Allergic/Immunologic: Negative.   Neurological: Negative.   Psychiatric/Behavioral: Negative.        Physical Exam  BP 118/70 (BP Location: Left Arm, Patient Position: Sitting, Cuff Size: Normal)   Pulse 99   Ht 5\' 4"  (1.626 m)   Wt 132 lb 3.2 oz (60 kg)   SpO2 98% Comment: 2L of O2  BMI 22.69 kg/m   Wt Readings from Last 5 Encounters:  05/23/18 132 lb 3.2 oz (60 kg)  04/27/18 133 lb (60.3 kg)  04/23/18 138 lb (62.6 kg)  02/07/18 140 lb (63.5 kg)  02/07/18 140 lb 3.2 oz (63.6 kg)     Physical Exam  Constitutional: She is oriented to person, place, and time. She appears well-developed and well-nourished. No distress.  Cardiovascular: Normal rate and regular rhythm.  Pulmonary/Chest: Effort normal and breath sounds normal.  Neurological: She is alert and oriented to person, place, and time.  Skin: Rash (bilateral arms and legs) noted. Rash is macular and papular.  Psychiatric: She has a normal mood and affect.  Nursing note and vitals reviewed.     Assessment & Plan:    Rash Patient Instructions  Continue current medications Will check labs - patient is on Cellcept and bactrim Continue kenalog cream, Singulair, and zyrtec for rash Cool compresses to rash Follow up with Dr. Lake Bells in around 4 weeks        Fenton Foy, NP 05/23/2018

## 2018-05-24 DIAGNOSIS — N3941 Urge incontinence: Secondary | ICD-10-CM | POA: Diagnosis not present

## 2018-05-24 NOTE — Addendum Note (Signed)
Addended by: Nena Polio on: 05/24/2018 03:32 PM   Modules accepted: Orders

## 2018-05-25 DIAGNOSIS — J849 Interstitial pulmonary disease, unspecified: Secondary | ICD-10-CM | POA: Diagnosis not present

## 2018-05-25 DIAGNOSIS — Z79899 Other long term (current) drug therapy: Secondary | ICD-10-CM | POA: Diagnosis not present

## 2018-05-25 DIAGNOSIS — R21 Rash and other nonspecific skin eruption: Secondary | ICD-10-CM | POA: Diagnosis not present

## 2018-05-25 DIAGNOSIS — M2550 Pain in unspecified joint: Secondary | ICD-10-CM | POA: Diagnosis not present

## 2018-05-25 DIAGNOSIS — R0602 Shortness of breath: Secondary | ICD-10-CM | POA: Diagnosis not present

## 2018-05-25 DIAGNOSIS — M0579 Rheumatoid arthritis with rheumatoid factor of multiple sites without organ or systems involvement: Secondary | ICD-10-CM | POA: Diagnosis not present

## 2018-05-25 DIAGNOSIS — Z6822 Body mass index (BMI) 22.0-22.9, adult: Secondary | ICD-10-CM | POA: Diagnosis not present

## 2018-05-28 ENCOUNTER — Ambulatory Visit: Payer: Medicare Other | Admitting: Nurse Practitioner

## 2018-05-28 ENCOUNTER — Encounter: Payer: Self-pay | Admitting: General Surgery

## 2018-05-29 ENCOUNTER — Other Ambulatory Visit: Payer: Self-pay | Admitting: General Surgery

## 2018-05-29 DIAGNOSIS — Z5181 Encounter for therapeutic drug level monitoring: Secondary | ICD-10-CM

## 2018-05-29 NOTE — Progress Notes (Signed)
Orders placed. Will need to add the additional lab to be repeated weekly after this weeks CBC has been done.

## 2018-05-29 NOTE — Progress Notes (Signed)
Labs entered into chart.

## 2018-05-31 ENCOUNTER — Encounter: Payer: Self-pay | Admitting: General Surgery

## 2018-05-31 ENCOUNTER — Other Ambulatory Visit (INDEPENDENT_AMBULATORY_CARE_PROVIDER_SITE_OTHER): Payer: Medicare Other

## 2018-05-31 DIAGNOSIS — R351 Nocturia: Secondary | ICD-10-CM | POA: Diagnosis not present

## 2018-05-31 DIAGNOSIS — E876 Hypokalemia: Secondary | ICD-10-CM

## 2018-05-31 DIAGNOSIS — Z5181 Encounter for therapeutic drug level monitoring: Secondary | ICD-10-CM | POA: Diagnosis not present

## 2018-05-31 DIAGNOSIS — N3941 Urge incontinence: Secondary | ICD-10-CM | POA: Diagnosis not present

## 2018-05-31 LAB — CBC WITH DIFFERENTIAL/PLATELET
BASOS PCT: 0.3 % (ref 0.0–3.0)
Basophils Absolute: 0 10*3/uL (ref 0.0–0.1)
EOS ABS: 0.1 10*3/uL (ref 0.0–0.7)
Eosinophils Relative: 1.2 % (ref 0.0–5.0)
HCT: 40.2 % (ref 36.0–46.0)
HEMOGLOBIN: 13.3 g/dL (ref 12.0–15.0)
Lymphocytes Relative: 41.7 % (ref 12.0–46.0)
Lymphs Abs: 4.1 10*3/uL — ABNORMAL HIGH (ref 0.7–4.0)
MCHC: 33.2 g/dL (ref 30.0–36.0)
MCV: 89.8 fl (ref 78.0–100.0)
MONO ABS: 1.1 10*3/uL — AB (ref 0.1–1.0)
Monocytes Relative: 11 % (ref 3.0–12.0)
Neutro Abs: 4.5 10*3/uL (ref 1.4–7.7)
Neutrophils Relative %: 45.8 % (ref 43.0–77.0)
Platelets: 202 10*3/uL (ref 150.0–400.0)
RBC: 4.47 Mil/uL (ref 3.87–5.11)
RDW: 13.2 % (ref 11.5–15.5)
WBC: 9.8 10*3/uL (ref 4.0–10.5)

## 2018-05-31 LAB — HEPATIC FUNCTION PANEL
ALT: 10 U/L (ref 0–35)
AST: 16 U/L (ref 0–37)
Albumin: 3.5 g/dL (ref 3.5–5.2)
Alkaline Phosphatase: 75 U/L (ref 39–117)
BILIRUBIN DIRECT: 0.1 mg/dL (ref 0.0–0.3)
BILIRUBIN TOTAL: 0.7 mg/dL (ref 0.2–1.2)
Total Protein: 7.4 g/dL (ref 6.0–8.3)

## 2018-05-31 LAB — BASIC METABOLIC PANEL
BUN: 9 mg/dL (ref 6–23)
CALCIUM: 9.4 mg/dL (ref 8.4–10.5)
CO2: 30 meq/L (ref 19–32)
Chloride: 102 mEq/L (ref 96–112)
Creatinine, Ser: 0.78 mg/dL (ref 0.40–1.20)
GFR: 90.13 mL/min (ref >60.00)
GLUCOSE: 90 mg/dL (ref 70–99)
POTASSIUM: 3.8 meq/L (ref 3.5–5.1)
Sodium: 138 mEq/L (ref 135–145)

## 2018-06-01 ENCOUNTER — Other Ambulatory Visit: Payer: Self-pay | Admitting: General Surgery

## 2018-06-01 DIAGNOSIS — J849 Interstitial pulmonary disease, unspecified: Secondary | ICD-10-CM

## 2018-06-01 LAB — GLUCOSE 6 PHOSPHATE DEHYDROGENASE: G-6PDH: 13.6 U/g{Hb} (ref 7.0–20.5)

## 2018-06-01 MED ORDER — DAPSONE 100 MG PO TABS: 100.0000 mg | ORAL_TABLET | Freq: Every day | ORAL | 1 refills | Status: DC

## 2018-06-01 NOTE — Addendum Note (Signed)
Addended by: Nena Polio on: 06/01/2018 03:29 PM   Modules accepted: Orders

## 2018-06-04 DIAGNOSIS — M0579 Rheumatoid arthritis with rheumatoid factor of multiple sites without organ or systems involvement: Secondary | ICD-10-CM | POA: Diagnosis not present

## 2018-06-04 NOTE — Progress Notes (Signed)
Orders placed, see related message. Patient already contacted last week about labs and dates provided.

## 2018-06-05 DIAGNOSIS — N3941 Urge incontinence: Secondary | ICD-10-CM | POA: Diagnosis not present

## 2018-06-11 ENCOUNTER — Other Ambulatory Visit (INDEPENDENT_AMBULATORY_CARE_PROVIDER_SITE_OTHER): Payer: Medicare Other

## 2018-06-11 DIAGNOSIS — Z5181 Encounter for therapeutic drug level monitoring: Secondary | ICD-10-CM | POA: Diagnosis not present

## 2018-06-11 LAB — COMPREHENSIVE METABOLIC PANEL
ALT: 12 U/L (ref 0–35)
AST: 18 U/L (ref 0–37)
Albumin: 3.6 g/dL (ref 3.5–5.2)
Alkaline Phosphatase: 71 U/L (ref 39–117)
BILIRUBIN TOTAL: 0.6 mg/dL (ref 0.2–1.2)
BUN: 10 mg/dL (ref 6–23)
CALCIUM: 9.3 mg/dL (ref 8.4–10.5)
CHLORIDE: 103 meq/L (ref 96–112)
CO2: 28 mEq/L (ref 19–32)
Creatinine, Ser: 0.85 mg/dL (ref 0.40–1.20)
GFR: 81.62 mL/min (ref >60.00)
Glucose, Bld: 104 mg/dL — ABNORMAL HIGH (ref 70–99)
Potassium: 3.5 mEq/L (ref 3.5–5.1)
Sodium: 140 mEq/L (ref 135–145)
Total Protein: 7.4 g/dL (ref 6.0–8.3)

## 2018-06-11 LAB — CBC WITH DIFFERENTIAL/PLATELET
BASOS PCT: 0.4 % (ref 0.0–3.0)
Basophils Absolute: 0 10*3/uL (ref 0.0–0.1)
EOS PCT: 1.3 % (ref 0.0–5.0)
Eosinophils Absolute: 0.1 10*3/uL (ref 0.0–0.7)
HEMATOCRIT: 40.4 % (ref 36.0–46.0)
Hemoglobin: 13.3 g/dL (ref 12.0–15.0)
LYMPHS PCT: 40.8 % (ref 12.0–46.0)
Lymphs Abs: 4.6 10*3/uL — ABNORMAL HIGH (ref 0.7–4.0)
MCHC: 32.9 g/dL (ref 30.0–36.0)
MCV: 90 fl (ref 78.0–100.0)
MONOS PCT: 9.2 % (ref 3.0–12.0)
Monocytes Absolute: 1 10*3/uL (ref 0.1–1.0)
NEUTROS ABS: 5.5 10*3/uL (ref 1.4–7.7)
Neutrophils Relative %: 48.3 % (ref 43.0–77.0)
Platelets: 208 10*3/uL (ref 150.0–400.0)
RBC: 4.49 Mil/uL (ref 3.87–5.11)
RDW: 13.8 % (ref 11.5–15.5)
WBC: 11.3 10*3/uL — ABNORMAL HIGH (ref 4.0–10.5)

## 2018-06-15 DIAGNOSIS — N3941 Urge incontinence: Secondary | ICD-10-CM | POA: Diagnosis not present

## 2018-06-18 ENCOUNTER — Ambulatory Visit: Payer: Self-pay | Admitting: *Deleted

## 2018-06-18 ENCOUNTER — Emergency Department (HOSPITAL_BASED_OUTPATIENT_CLINIC_OR_DEPARTMENT_OTHER)
Admission: EM | Admit: 2018-06-18 | Discharge: 2018-06-18 | Disposition: A | Discharge status: Home / Self Care | Payer: Medicare Other | Attending: Emergency Medicine | Admitting: Emergency Medicine

## 2018-06-18 ENCOUNTER — Encounter (HOSPITAL_BASED_OUTPATIENT_CLINIC_OR_DEPARTMENT_OTHER): Payer: Self-pay | Admitting: *Deleted

## 2018-06-18 ENCOUNTER — Emergency Department (HOSPITAL_BASED_OUTPATIENT_CLINIC_OR_DEPARTMENT_OTHER): Payer: Medicare Other

## 2018-06-18 ENCOUNTER — Other Ambulatory Visit: Payer: Self-pay

## 2018-06-18 DIAGNOSIS — Z87891 Personal history of nicotine dependence: Secondary | ICD-10-CM | POA: Diagnosis not present

## 2018-06-18 DIAGNOSIS — R058 Other specified cough: Secondary | ICD-10-CM

## 2018-06-18 DIAGNOSIS — Z7982 Long term (current) use of aspirin: Secondary | ICD-10-CM | POA: Insufficient documentation

## 2018-06-18 DIAGNOSIS — R079 Chest pain, unspecified: Secondary | ICD-10-CM | POA: Diagnosis not present

## 2018-06-18 DIAGNOSIS — Z79899 Other long term (current) drug therapy: Secondary | ICD-10-CM | POA: Insufficient documentation

## 2018-06-18 DIAGNOSIS — I2510 Atherosclerotic heart disease of native coronary artery without angina pectoris: Secondary | ICD-10-CM | POA: Diagnosis not present

## 2018-06-18 DIAGNOSIS — I110 Hypertensive heart disease with heart failure: Secondary | ICD-10-CM | POA: Diagnosis not present

## 2018-06-18 DIAGNOSIS — Z7901 Long term (current) use of anticoagulants: Secondary | ICD-10-CM | POA: Insufficient documentation

## 2018-06-18 DIAGNOSIS — M069 Rheumatoid arthritis, unspecified: Secondary | ICD-10-CM | POA: Insufficient documentation

## 2018-06-18 DIAGNOSIS — I5032 Chronic diastolic (congestive) heart failure: Secondary | ICD-10-CM | POA: Insufficient documentation

## 2018-06-18 DIAGNOSIS — R0789 Other chest pain: Secondary | ICD-10-CM | POA: Insufficient documentation

## 2018-06-18 DIAGNOSIS — R05 Cough: Secondary | ICD-10-CM

## 2018-06-18 DIAGNOSIS — Z96653 Presence of artificial knee joint, bilateral: Secondary | ICD-10-CM | POA: Diagnosis not present

## 2018-06-18 DIAGNOSIS — R0602 Shortness of breath: Secondary | ICD-10-CM | POA: Diagnosis not present

## 2018-06-18 LAB — CBC WITH DIFFERENTIAL/PLATELET
Abs Immature Granulocytes: 0.03 10*3/uL (ref 0.00–0.07)
Basophils Absolute: 0 10*3/uL (ref 0.0–0.1)
Basophils Relative: 0 %
Eosinophils Absolute: 0.1 10*3/uL (ref 0.0–0.5)
Eosinophils Relative: 1 %
HCT: 40.9 % (ref 36.0–46.0)
Hemoglobin: 13 g/dL (ref 12.0–15.0)
Immature Granulocytes: 0 %
LYMPHS PCT: 27 %
Lymphs Abs: 2.9 10*3/uL (ref 0.7–4.0)
MCH: 29.5 pg (ref 26.0–34.0)
MCHC: 31.8 g/dL (ref 30.0–36.0)
MCV: 92.7 fL (ref 80.0–100.0)
Monocytes Absolute: 1.2 10*3/uL — ABNORMAL HIGH (ref 0.1–1.0)
Monocytes Relative: 12 %
Neutro Abs: 6.3 10*3/uL (ref 1.7–7.7)
Neutrophils Relative %: 60 %
Platelets: 154 10*3/uL (ref 150–400)
RBC: 4.41 MIL/uL (ref 3.87–5.11)
RDW: 13.7 % (ref 11.5–15.5)
WBC: 10.5 10*3/uL (ref 4.0–10.5)
nRBC: 0 % (ref 0.0–0.2)

## 2018-06-18 LAB — BASIC METABOLIC PANEL
Anion gap: 6 (ref 5–15)
BUN: 10 mg/dL (ref 8–23)
CO2: 27 mmol/L (ref 22–32)
CREATININE: 0.69 mg/dL (ref 0.44–1.00)
Calcium: 8.9 mg/dL (ref 8.9–10.3)
Chloride: 101 mmol/L (ref 98–111)
GFR calc non Af Amer: >60 mL/min (ref >60)
Glucose, Bld: 101 mg/dL — ABNORMAL HIGH (ref 70–99)
Potassium: 3.3 mmol/L — ABNORMAL LOW (ref 3.5–5.1)
SODIUM: 134 mmol/L — AB (ref 135–145)

## 2018-06-18 LAB — D-DIMER, QUANTITATIVE: D-Dimer, Quant: 1.14 ug/mL-FEU — ABNORMAL HIGH (ref 0.00–0.50)

## 2018-06-18 MED ORDER — DICLOFENAC SODIUM 1 % TD GEL: 2.0000 g | Freq: Four times a day (QID) | TRANSDERMAL | 0 refills | Status: DC

## 2018-06-18 MED ORDER — HYDROCODONE-ACETAMINOPHEN 5-325 MG PO TABS: 2.0000 | ORAL_TABLET | Freq: Four times a day (QID) | ORAL | 0 refills | Status: DC | PRN

## 2018-06-18 MED ORDER — LEVOFLOXACIN 500 MG PO TABS: 500.0000 mg | ORAL_TABLET | Freq: Every day | ORAL | 0 refills | Status: DC

## 2018-06-18 MED ORDER — ACETAMINOPHEN 325 MG PO TABS
650.0000 mg | ORAL_TABLET | Freq: Once | ORAL | Status: AC
  Administered 2018-06-18: 650 mg via ORAL
  Filled 2018-06-18: qty 2

## 2018-06-18 MED ORDER — LEVALBUTEROL HCL 0.63 MG/3ML IN NEBU
0.6300 mg | INHALATION_SOLUTION | Freq: Once | RESPIRATORY_TRACT | Status: AC
  Administered 2018-06-18: 0.63 mg via RESPIRATORY_TRACT
  Filled 2018-06-18: qty 3

## 2018-06-18 MED ORDER — HYDROCODONE-ACETAMINOPHEN 5-325 MG PO TABS
1.0000 | ORAL_TABLET | Freq: Once | ORAL | Status: AC
  Administered 2018-06-18: 1 via ORAL
  Filled 2018-06-18: qty 1

## 2018-06-18 MED ORDER — IOPAMIDOL (ISOVUE-370) INJECTION 76%
100.0000 mL | Freq: Once | INTRAVENOUS | Status: AC | PRN
  Administered 2018-06-18: 100 mL via INTRAVENOUS

## 2018-06-18 NOTE — Discharge Instructions (Signed)
Return to the emergency room if you start running fever or having worsening shortness of breath, vomiting or confusion.  Be very cautious in taking the pain medication as it can cause of balance problems, confusion or excessive drowsiness.

## 2018-06-18 NOTE — Telephone Encounter (Signed)
Chest pain with coughing during the night- patient took Tums- it didn't help. Patient states she has pain with deep breath, and when she moves. Patient states the pain is severe and she is having trouble doing her normal activities. Patient could have pulled muscle- but with pain with inhalation and severe- she is sent to ED.  Reason for Disposition . SEVERE chest pain  Answer Assessment - Initial Assessment Questions 1. LOCATION: "Where does it hurt?"       R above the breast 2. RADIATION: "Does the pain go anywhere else?" (e.g., into neck, jaw, arms, back)     Stationary to the R of chest- it was in the middle of chest. Hurts when she takes breath  3. ONSET: "When did the chest pain begin?" (Minutes, hours or days)      Middle of night- not severe until she got up 4. PATTERN "Does the pain come and go, or has it been constant since it started?"  "Does it get worse with exertion?"      Constant, worse with breathing and movement 5. DURATION: "How long does it last" (e.g., seconds, minutes, hours)     hours 6. SEVERITY: "How bad is the pain?"  (e.g., Scale 1-10; mild, moderate, or severe)    - MILD (1-3): doesn't interfere with normal activities     - MODERATE (4-7): interferes with normal activities or awakens from sleep    - SEVERE (8-10): excruciating pain, unable to do any normal activities       8- severe 7. CARDIAC RISK FACTORS: "Do you have any history of heart problems or risk factors for heart disease?" (e.g., prior heart attack, angina; high blood pressure, diabetes, being overweight, high cholesterol, smoking, or strong family history of heart disease)     Yes- CAD 8. PULMONARY RISK FACTORS: "Do you have any history of lung disease?"  (e.g., blood clots in lung, asthma, emphysema, birth control pills)     Pulmonary fibrosis 9. CAUSE: "What do you think is causing the chest pain?"     unknown 10. OTHER SYMPTOMS: "Do you have any other symptoms?" (e.g., dizziness, nausea,  vomiting, sweating, fever, difficulty breathing, cough)       Patient is on O2 11. PREGNANCY: "Is there any chance you are pregnant?" "When was your last menstrual period?"       n/a  Protocols used: CHEST PAIN-A-AH

## 2018-06-18 NOTE — ED Notes (Signed)
Pt verbalizes understanding of d/c instructions and denies any further needs at this time. 

## 2018-06-18 NOTE — Telephone Encounter (Signed)
Patient went to ED

## 2018-06-18 NOTE — ED Triage Notes (Signed)
Pt c/o forcefully cough with CP this am

## 2018-06-19 ENCOUNTER — Ambulatory Visit: Payer: Medicare Other | Admitting: Nurse Practitioner

## 2018-06-19 NOTE — ED Provider Notes (Signed)
[facility] EMERGENCY DEPARTMENT Provider Note   CSN: [id number] Arrival date & time: 06/18/18  1408     History   Chief Complaint Chief Complaint  Patient presents with  . Chest Pain    HPI [name] [name] is a 84 y.o. female.  Patient is an 84 year old female with prior history of PE, DVT, coronary artery disease, rheumatoid arthritis on CellCept, pulmonary fibrosis on chronic O2, CHF who is presenting today with worsening chest wall pain.  Patient states that over the last few days she has had more coughing with sputum production which is unusual.  Last night she started coughing extremely hard and developed severe pain in her right chest.  The pain is sharp in nature and worse with taking a deep breath.  Today she is felt more short of breath and has had to wear her oxygen constantly which is not always the case.  She has not taken anything for this pain because she was not sure what would be safe.  He does have inhalers at home but has not tried to use them.  She denies any unilateral leg pain or swelling.  The history is provided by the patient.  Chest Pain   This is a new problem. The current episode started 12 to 24 hours ago. The problem occurs constantly. The problem has not changed since onset.The pain is associated with breathing, coughing and movement. The pain is present in the lateral region. The pain is at a severity of 7/10. The pain is severe. The quality of the pain is described as sharp and pleuritic. The pain does not radiate. Duration of episode(s) is 12 hours. The symptoms are aggravated by deep breathing and certain positions. Associated symptoms include cough, shortness of breath and sputum production. Pertinent negatives include no abdominal pain, no fever, no leg pain, no lower extremity edema and no nausea. She has tried rest for the symptoms. The treatment provided no relief. Risk factors include being elderly.    Past Medical History:  Diagnosis Date    . Allergic rhinitis   . Allergy   . CAD (coronary artery disease)    Mild CAD by cath 2008  . DJD (degenerative joint disease)    rheumatoid  . DVT (deep venous thrombosis) ([place])   . Dyslipidemia   . History of nuclear stress test    Myoview 5/17:  EF 74%, normal perfusion, low risk  . History of pulmonary embolism   . Hyperlipidemia   . Insomnia   . Osteoporosis   . Pulmonary fibrosis ([place])   . Rheumatoid arthritis [facility])     Patient Active Problem List   Diagnosis Date Noted  . Chronic diastolic CHF (congestive heart failure) ([place]) 12/19/2017  . Influenza 08/24/2017  . Rash 08/11/2017  . Chronic respiratory failure with hypoxia ([place]) 03/30/2017  . Angular cheilitis 03/08/2017  . Leg pain 10/25/2016  . Back pain 08/05/2016  . RA (rheumatoid arthritis) ([place]) 03/31/2015  . Allergic rhinitis   . Varicose vein 09/11/2014  . Chest pain 02/27/2012  . CAD (coronary artery disease) 02/27/2012  . Pruritus 08/18/2009  . Postinflammatory pulmonary fibrosis ([place]) 06/30/2008  . CONSTIPATION 01/03/2008  . Dyslipidemia 06/16/2007  . Personal history of DVT (deep vein thrombosis) 06/16/2007    Past Surgical History:  Procedure Laterality Date  . ABDOMINAL HYSTERECTOMY  1974  . APPENDECTOMY  1959  . CATARACT EXTRACTION    . LEFT HEART CATHETERIZATION WITH CORONARY ANGIOGRAM N/A 06/18/2013   Procedure: LEFT HEART  CATHETERIZATION WITH CORONARY ANGIOGRAM;  Surgeon: Peter M Martinique, MD;  Location: [facility] CATH LAB;  Service: Cardiovascular;  Laterality: N/A;  . TONSILLECTOMY  1941, 1951  . TOTAL KNEE ARTHROPLASTY  1997, 2007   Bilateral     OB History    Gravida  4   Para  2   Term  2   Preterm      AB      Living  0     SAB      TAB      Ectopic      Multiple      Live Births               Home Medications    Prior to Admission medications   Medication Sig Start Date End Date Taking? Authorizing Provider  acetaminophen (TYLENOL) 500 MG tablet Take 2 tablets  (1,000 mg total) by mouth every 6 (six) hours as needed. 08/19/17   Charlesetta Shanks, MD  ALPRAZolam Duanne Moron) 0.25 MG tablet Take 1 tablet (0.25 mg total) by mouth at bedtime as needed for anxiety. 04/23/18   Hoyt Koch, MD  aspirin EC 81 MG tablet Take 1 tablet (81 mg total) by mouth daily. 11/30/15   Richardson Dopp T, PA-C  atorvastatin (LIPITOR) 10 MG tablet Take 1 tablet (10 mg total) by mouth daily. 12/27/16   Burnell Blanks, MD  cetirizine (ZYRTEC) 10 MG tablet Take 1 tablet (10 mg total) by mouth 2 (two) times daily. 04/27/18   Kennith Gain, MD  Cholecalciferol (VITAMIN D3) 2000 UNITS capsule Take 1 capsule (2,000 Units total) by mouth daily. 03/31/15   Plotnikov, Evie Lacks, MD  dapsone 100 MG tablet Take 1 tablet (100 mg total) by mouth daily. 06/01/18   Fenton Foy, NP  diclofenac sodium (VOLTAREN) 1 % GEL Apply 2 g topically 4 (four) times daily. 06/18/18   Blanchie Dessert, MD  enoxaparin (LOVENOX) 30 MG/0.3ML injection Inject 0.3 mLs (30 mg total) into the skin every 12 (twelve) hours as needed (travel only). 10/24/16   Hoyt Koch, MD  famotidine (PEPCID) 20 MG tablet Take 1 tablet (20 mg total) by mouth 2 (two) times daily. 04/27/18   Kennith Gain, MD  fluticasone (FLONASE) 50 MCG/ACT nasal spray Place 2 sprays into both nostrils daily as needed for allergies or rhinitis. 03/17/15   Hoyt Koch, MD  furosemide (LASIX) 20 MG tablet Take 1 tablet by mouth daily as needed for swelling. 06/15/17   Burnell Blanks, MD  HYDROcodone-acetaminophen (NORCO/VICODIN) 5-325 MG tablet Take 2 tablets by mouth every 6 (six) hours as needed for severe pain. 06/18/18   Blanchie Dessert, MD  InFLIXimab (REMICADE IV) Inject into the vein.    [provider]  latanoprost (XALATAN) 0.005 % ophthalmic solution Place 1 drop into both eyes at bedtime.  09/06/14   [provider]  levalbuterol Penne Lash HFA) 45 MCG/ACT inhaler  Inhale 2 puffs into the lungs every 6 (six) hours as needed for wheezing. 06/05/17   Magdalen Spatz, NP  levofloxacin (LEVAQUIN) 500 MG tablet Take 1 tablet (500 mg total) by mouth daily. 06/18/18   Blanchie Dessert, MD  montelukast (SINGULAIR) 10 MG tablet Take 1 tablet (10 mg total) by mouth at bedtime. 05/09/18   Kennith Gain, MD  mycophenolate (CELLCEPT) 500 MG tablet 1 tab BID X1 week, then 2 tabs BID maintenance 01/31/18   Juanito Doom, MD  omega-3 acid ethyl esters (LOVAZA) 1 g  capsule Take 1 g by mouth daily.    [provider]  ondansetron (ZOFRAN) 4 MG tablet Take 1 tablet (4 mg total) by mouth every 8 (eight) hours as needed for nausea or vomiting. 08/24/17   Hoyt Koch, MD  Polyethyl Glycol-Propyl Glycol (SYSTANE) 0.4-0.3 % SOLN Apply 1 drop to eye daily as needed (for dry eyes).    [provider]  predniSONE (DELTASONE) 5 MG tablet Take 5 mg by mouth daily with breakfast.    [provider]  saccharomyces boulardii (FLORASTOR) 250 MG capsule Take 1 capsule (250 mg total) by mouth 2 (two) times daily. 08/24/17   Hoyt Koch, MD  sulfamethoxazole-trimethoprim (BACTRIM DS,SEPTRA DS) 800-160 MG tablet TAKE 1 TABLET BY MOUTH THREE TIMES A WEEK ON MONDAY, WEDNESDAY, AND FRIDAY. 04/11/18   Juanito Doom, MD  triamcinolone cream (KENALOG) 0.1 % Apply 1 application topically 2 (two) times daily. 04/23/18   Hoyt Koch, MD    Family History Family History  Problem Relation Age of Onset  . Kidney disease Daughter 5  . Cancer Daughter   . Heart attack Father 74  . Alzheimer's disease Sister   . Heart disease Sister   . Alzheimer's disease Sister     Social History Social History   Tobacco Use  . Smoking status: Former Smoker    Packs/day: 0.30    Years: 20.00    Pack years: 6.00    Types: Cigarettes    Last attempt to quit: 07/11/1973    Years since quitting: 44.9  . Smokeless tobacco: Never Used    Substance Use Topics  . Alcohol use: No  . Drug use: No     Allergies   Crestor [rosuvastatin]; Lactose intolerance (gi); Penicillins; Imuran [azathioprine]; and Niacin   Review of Systems Review of Systems  Constitutional: Negative for fever.  Respiratory: Positive for cough, sputum production and shortness of breath.   Cardiovascular: Positive for chest pain.  Gastrointestinal: Negative for abdominal pain and nausea.  All other systems reviewed and are negative.    Physical Exam Updated Vital Signs BP 135/71 (BP Location: Left Arm)   Pulse 87   Temp 98.9 F (37.2 C) (Oral)   Resp 18   Ht 5\' 6"  (1.676 m)   Wt 58.1 kg   SpO2 100%   BMI 20.66 kg/m   Physical Exam  Constitutional: She is oriented to person, place, and time. She appears well-developed. She appears cachectic. No distress.  HENT:  Head: Normocephalic and atraumatic.  Mouth/Throat: Oropharynx is clear and moist.  Eyes: Pupils are equal, round, and reactive to light. Conjunctivae and EOM are normal.  Neck: Normal range of motion. Neck supple.  Cardiovascular: Normal rate, regular rhythm and intact distal pulses.  No murmur heard. Pulmonary/Chest: Effort normal. No respiratory distress. She has no wheezes. She has rales. She exhibits tenderness.  Diffuse fine crackles throughout    Abdominal: Soft. She exhibits no distension. There is no tenderness. There is no rebound and no guarding.  Musculoskeletal: Normal range of motion. She exhibits no edema or tenderness.  kyphosis  Neurological: She is alert and oriented to person, place, and time.  Skin: Skin is warm and dry. Capillary refill takes less than 2 seconds. No rash noted. No erythema.  Psychiatric: She has a normal mood and affect. Her behavior is normal.  Nursing note and vitals reviewed.    ED Treatments / Results  Labs (all labs ordered are listed, but only abnormal results are  displayed) Labs Reviewed  CBC WITH DIFFERENTIAL/PLATELET -  Abnormal; Notable for the following components:      Result Value   Monocytes Absolute 1.2 (*)    All other components within normal limits  BASIC METABOLIC PANEL - Abnormal; Notable for the following components:   Sodium 134 (*)    Potassium 3.3 (*)    Glucose, Bld 101 (*)    All other components within normal limits  D-DIMER, QUANTITATIVE (NOT AT [facility]) - Abnormal; Notable for the following components:   D-Dimer, Quant 1.14 (*)    All other components within normal limits    EKG EKG Interpretation  Date/Time:  Monday June 18 2018 14:24:26 EST Ventricular Rate:  93 PR Interval:  156 QRS Duration: 72 QT Interval:  338 QTC Calculation: 420 R Axis:   14 Text Interpretation:  Normal sinus rhythm Normal ECG No significant change since last tracing Confirmed by Blanchie Dessert [id number]) on 06/18/2018 6:42:46 PM   Radiology Dg Chest 2 View  Result Date: 06/18/2018 CLINICAL DATA:  84 year old female with cough and chest pain. EXAM: CHEST - 2 VIEW COMPARISON:  CTA chest 01/31/2018 and earlier. FINDINGS: Chronic lung disease with pulmonary fibrosis, peripheral and lower lung honeycombing. Lung volumes and mediastinal contours are stable since February. No superimposed pneumothorax, pleural effusion or acute pulmonary opacity. Osteopenia. No acute osseous abnormality identified. Negative visible bowel gas pattern. IMPRESSION: Chronic pulmonary fibrosis. No superimposed acute findings are identified. Electronically Signed   By: Genevie Ann M.D.   On: 06/18/2018 14:59   Ct Angio Chest Pe W And/or Wo Contrast  Result Date: 06/18/2018 CLINICAL DATA:  Chest pain and cough. EXAM: CT ANGIOGRAPHY CHEST WITH CONTRAST TECHNIQUE: Multidetector CT imaging of the chest was performed using the standard protocol during bolus administration of intravenous contrast. Multiplanar CT image reconstructions and MIPs were obtained to evaluate the vascular anatomy. CONTRAST:  126mL ISOVUE-370 IOPAMIDOL (ISOVUE-370)  INJECTION 76% COMPARISON:  Chest radiographs obtained earlier today and chest CTA dated 01/31/2018. FINDINGS: Cardiovascular: Normally opacified pulmonary arteries with no pulmonary arterial filling defects seen. Atheromatous calcifications, including the coronary arteries and aorta. Mediastinum/Nodes: Small hiatal hernia. Enlarged right hilar node with a mild increase in size with a short axis diameter of 14 mm on image number 50 series 4. No other enlarged lymph nodes are seen. Unremarkable thyroid gland. Lungs/Pleura: Bilateral bullous changes and extensive honeycombing without significant change. No pleural fluid. Upper Abdomen: Multiple colonic diverticula. Musculoskeletal: Mild thoracic and lower cervical spine degenerative changes. Review of the MIP images confirms the above findings. IMPRESSION: 1. No pulmonary emboli or acute abnormality. 2. Stable changes of COPD and interstitial fibrosis with honeycombing. 3. Mild increase in size of a mildly enlarged right hilar node, most likely reactive. 4. Calcified coronary artery and aortic atherosclerosis. 5. Colonic diverticulosis. 6. Small hiatal hernia. Aortic Atherosclerosis (ICD10-I70.0) and Emphysema (ICD10-J43.9). Electronically Signed   By: Claudie Revering M.D.   On: 06/18/2018 20:53    Procedures Procedures (including critical care time)  Medications Ordered in ED Medications  acetaminophen (TYLENOL) tablet 650 mg (650 mg Oral Given 06/18/18 1907)  levalbuterol (XOPENEX) nebulizer solution 0.63 mg (0.63 mg Nebulization Given 06/18/18 1923)  iopamidol (ISOVUE-370) 76 % injection 100 mL (100 mLs Intravenous Contrast Given 06/18/18 2020)  HYDROcodone-acetaminophen (NORCO/VICODIN) 5-325 MG per tablet 1 tablet (1 tablet Oral Given 06/18/18 2124)     Initial Impression / Assessment and Plan / ED Course  I have reviewed the triage vital signs and the nursing notes.  Pertinent labs & imaging results that were available during my care of the patient were  reviewed by me and considered in my medical decision making (see chart for details).     Presenting today with a history of multiple medical problems including being immune suppressed and having pulmonary fibrosis with what seems most likely to be chest wall pain.  This started after a severe coughing episode the patient states over the last few days she has had worsening cough and more sputum production.  She also has felt more short of breath today and is wearing her oxygen constantly.  Patient is in no acute distress on exam but pain is reproduced when palpating the chest and when the patient moves.  Low suspicion at this time for cardiac etiology.  Normal EKG.  Given patient's prior history of PEs and pulmonary disease with more increased sputum x-ray was done which showed no acute findings.  CT of the chest showed no evidence of PE, pneumothorax, rib fracture or pneumonia.  Labs are reassuring.  Given patient's history and report of increased sputum will start on antibiotics for concern for early infection.  She was also given pain control and encouraged to follow-up closely with her PCP.  Patient's family is in from out of town and will be staying with her to ensure that she is safe.  Final Clinical Impressions(s) / ED Diagnoses   Final diagnoses:  Chest wall pain  Productive cough    ED Discharge Orders         Ordered    HYDROcodone-acetaminophen (NORCO/VICODIN) 5-325 MG tablet  Every 6 hours PRN     06/18/18 2118    levofloxacin (LEVAQUIN) 500 MG tablet  Daily     06/18/18 2118    diclofenac sodium (VOLTAREN) 1 % GEL  4 times daily     06/18/18 2118           Blanchie Dessert, MD 06/19/18 0022

## 2018-06-21 DIAGNOSIS — N3941 Urge incontinence: Secondary | ICD-10-CM | POA: Diagnosis not present

## 2018-07-10 ENCOUNTER — Encounter: Payer: Self-pay | Admitting: Nurse Practitioner

## 2018-07-10 ENCOUNTER — Ambulatory Visit (INDEPENDENT_AMBULATORY_CARE_PROVIDER_SITE_OTHER): Payer: Medicare Other | Admitting: Nurse Practitioner

## 2018-07-10 VITALS — BP 110/70 | HR 102 | Temp 98.5°F | Ht 64.0 in | Wt 128.2 lb

## 2018-07-10 DIAGNOSIS — B370 Candidal stomatitis: Secondary | ICD-10-CM | POA: Diagnosis not present

## 2018-07-10 DIAGNOSIS — K130 Diseases of lips: Secondary | ICD-10-CM

## 2018-07-10 MED ORDER — NYSTATIN 100000 UNIT/ML MT SUSP: 5.0000 mL | Freq: Three times a day (TID) | OROMUCOSAL | 1 refills | Status: DC

## 2018-07-10 MED ORDER — FLUCONAZOLE 150 MG PO TABS: 150.0000 mg | ORAL_TABLET | Freq: Once | ORAL | 0 refills | Status: AC

## 2018-07-10 NOTE — Progress Notes (Signed)
Subjective:  Patient ID: [name] [name], female    DOB: 10-09-1930  Age: 84 y.o. MRN: [id number]  CC: Mouth Lesions (pt complaining of tongue sore, cant eat, denies any swelling of tongue and no spot/ x2 days)   Mouth Lesions   The current episode started 2 days ago. The onset was sudden. The problem occurs continuously. The problem has been unchanged. Nothing relieves the symptoms. The symptoms are aggravated by eating and drinking. Associated symptoms include mouth sores. Pertinent negatives include no fever, no nausea, no congestion, no ear pain, no rhinorrhea, no sore throat, no stridor, no swollen glands, no neck pain, no cough, no URI, no wheezing and no rash. Urine output has been normal. There were no sick contacts. Recently, medical care has been given at this facility. Services received include medications given.  current use of levaquin and prednisone. Use of upper and lower denture  Reviewed past Medical, Social and Family history today.  Outpatient Medications Prior to Visit  Medication Sig Dispense Refill  . acetaminophen (TYLENOL) 500 MG tablet Take 2 tablets (1,000 mg total) by mouth every 6 (six) hours as needed. 30 tablet 0  . ALPRAZolam (XANAX) 0.25 MG tablet Take 1 tablet (0.25 mg total) by mouth at bedtime as needed for anxiety. 15 tablet 0  . aspirin EC 81 MG tablet Take 1 tablet (81 mg total) by mouth daily.    [name] atorvastatin (LIPITOR) 10 MG tablet Take 1 tablet (10 mg total) by mouth daily. 90 tablet 3  . cetirizine (ZYRTEC) 10 MG tablet Take 1 tablet (10 mg total) by mouth 2 (two) times daily. 60 tablet 3  . Cholecalciferol (VITAMIN D3) 2000 UNITS capsule Take 1 capsule (2,000 Units total) by mouth daily. 100 capsule 3  . dapsone 100 MG tablet Take 1 tablet (100 mg total) by mouth daily. 30 tablet 1  . diclofenac sodium (VOLTAREN) 1 % GEL Apply 2 g topically 4 (four) times daily. 100 g 0  . enoxaparin (LOVENOX) 30 MG/0.3ML injection Inject 0.3 mLs (30 mg total) into  the skin every 12 (twelve) hours as needed (travel only). 10 Syringe 0  . famotidine (PEPCID) 20 MG tablet Take 1 tablet (20 mg total) by mouth 2 (two) times daily. 60 tablet 3  . fluticasone (FLONASE) 50 MCG/ACT nasal spray Place 2 sprays into both nostrils daily as needed for allergies or rhinitis. 16 g 3  . furosemide (LASIX) 20 MG tablet Take 1 tablet by mouth daily as needed for swelling. 30 tablet 11  . HYDROcodone-acetaminophen (NORCO/VICODIN) 5-325 MG tablet Take 2 tablets by mouth every 6 (six) hours as needed for severe pain. 10 tablet 0  . InFLIXimab (REMICADE IV) Inject into the vein.    [name] latanoprost (XALATAN) 0.005 % ophthalmic solution Place 1 drop into both eyes at bedtime.   0  . levalbuterol (XOPENEX HFA) 45 MCG/ACT inhaler Inhale 2 puffs into the lungs every 6 (six) hours as needed for wheezing. 1 Inhaler 12  . levofloxacin (LEVAQUIN) 500 MG tablet Take 1 tablet (500 mg total) by mouth daily. 7 tablet 0  . montelukast (SINGULAIR) 10 MG tablet Take 1 tablet (10 mg total) by mouth at bedtime. 30 tablet 2  . mycophenolate (CELLCEPT) 500 MG tablet 1 tab BID X1 week, then 2 tabs BID maintenance 120 tablet 3  . omega-3 acid ethyl esters (LOVAZA) 1 g capsule Take 1 g by mouth daily.    . ondansetron (ZOFRAN) 4 MG tablet Take 1 tablet (4  mg total) by mouth every 8 (eight) hours as needed for nausea or vomiting. 20 tablet 0  . Polyethyl Glycol-Propyl Glycol (SYSTANE) 0.4-0.3 % SOLN Apply 1 drop to eye daily as needed (for dry eyes).    . predniSONE (DELTASONE) 5 MG tablet Take 5 mg by mouth daily with breakfast.    . saccharomyces boulardii (FLORASTOR) 250 MG capsule Take 1 capsule (250 mg total) by mouth 2 (two) times daily. 60 capsule 0  . sulfamethoxazole-trimethoprim (BACTRIM DS,SEPTRA DS) 800-160 MG tablet TAKE 1 TABLET BY MOUTH THREE TIMES A WEEK ON MONDAY, WEDNESDAY, AND FRIDAY. 30 tablet 0  . triamcinolone cream (KENALOG) 0.1 % Apply 1 application topically 2 (two) times daily.  453.6 g 0   No facility-administered medications prior to visit.     ROS See HPI  Objective:  BP 110/70   Pulse (!) 102   Temp 98.5 F (36.9 C) (Oral)   Ht 5\' 4"  (1.626 m)   Wt 128 lb 3.2 oz (58.2 kg)   SpO2 97%   BMI 22.01 kg/m   BP Readings from Last 3 Encounters:  07/10/18 110/70  06/18/18 135/71  05/23/18 118/70    Wt Readings from Last 3 Encounters:  07/10/18 128 lb 3.2 oz (58.2 kg)  06/18/18 128 lb (58.1 kg)  05/23/18 132 lb 3.2 oz (60 kg)    Physical Exam HENT:     Mouth/Throat:     Dentition: Has dentures. No gum lesions.     Pharynx: Posterior oropharyngeal erythema present. No oropharyngeal exudate.     Tonsils: No tonsillar exudate.     Comments: Erythematous patches on buccal mucosa, hard palate and tongue. Fissure on bilateral corner on lips. Lymphadenopathy:     Cervical: No cervical adenopathy.  Neurological:     Mental Status: She is oriented to person, place, and time.     Lab Results  Component Value Date   WBC 10.5 06/18/2018   HGB 13.0 06/18/2018   HCT 40.9 06/18/2018   PLT 154 06/18/2018   GLUCOSE 101 (H) 06/18/2018   CHOL 177 12/23/2016   TRIG 96 12/23/2016   HDL 70 12/23/2016   LDLCALC 88 12/23/2016   ALT 12 06/11/2018   AST 18 06/11/2018   NA 134 (L) 06/18/2018   K 3.3 (L) 06/18/2018   CL 101 06/18/2018   CREATININE 0.69 06/18/2018   BUN 10 06/18/2018   CO2 27 06/18/2018   TSH 1.83 10/24/2016   INR 2.2 01/28/2014    Dg Chest 2 View  Result Date: 06/18/2018 CLINICAL DATA:  84 year old female with cough and chest pain. EXAM: CHEST - 2 VIEW COMPARISON:  CTA chest 01/31/2018 and earlier. FINDINGS: Chronic lung disease with pulmonary fibrosis, peripheral and lower lung honeycombing. Lung volumes and mediastinal contours are stable since February. No superimposed pneumothorax, pleural effusion or acute pulmonary opacity. Osteopenia. No acute osseous abnormality identified. Negative visible bowel gas pattern. IMPRESSION: Chronic  pulmonary fibrosis. No superimposed acute findings are identified. Electronically Signed   By: Genevie Ann M.D.   On: 06/18/2018 14:59   Ct Angio Chest Pe W And/or Wo Contrast  Result Date: 06/18/2018 CLINICAL DATA:  Chest pain and cough. EXAM: CT ANGIOGRAPHY CHEST WITH CONTRAST TECHNIQUE: Multidetector CT imaging of the chest was performed using the standard protocol during bolus administration of intravenous contrast. Multiplanar CT image reconstructions and MIPs were obtained to evaluate the vascular anatomy. CONTRAST:  111mL ISOVUE-370 IOPAMIDOL (ISOVUE-370) INJECTION 76% COMPARISON:  Chest radiographs obtained earlier today and chest CTA  dated 01/31/2018. FINDINGS: Cardiovascular: Normally opacified pulmonary arteries with no pulmonary arterial filling defects seen. Atheromatous calcifications, including the coronary arteries and aorta. Mediastinum/Nodes: Small hiatal hernia. Enlarged right hilar node with a mild increase in size with a short axis diameter of 14 mm on image number 50 series 4. No other enlarged lymph nodes are seen. Unremarkable thyroid gland. Lungs/Pleura: Bilateral bullous changes and extensive honeycombing without significant change. No pleural fluid. Upper Abdomen: Multiple colonic diverticula. Musculoskeletal: Mild thoracic and lower cervical spine degenerative changes. Review of the MIP images confirms the above findings. IMPRESSION: 1. No pulmonary emboli or acute abnormality. 2. Stable changes of COPD and interstitial fibrosis with honeycombing. 3. Mild increase in size of a mildly enlarged right hilar node, most likely reactive. 4. Calcified coronary artery and aortic atherosclerosis. 5. Colonic diverticulosis. 6. Small hiatal hernia. Aortic Atherosclerosis (ICD10-I70.0) and Emphysema (ICD10-J43.9). Electronically Signed   By: Claudie Revering M.D.   On: 06/18/2018 20:53    Assessment & Plan:   [name] [name] was seen today for mouth lesions.  Diagnoses and all orders for this  visit:  Angular cheilitis -     nystatin (MYCOSTATIN) 100000 UNIT/ML suspension; Take 5 mLs (500,000 Units total) by mouth 3 (three) times daily after meals. -     fluconazole (DIFLUCAN) 150 MG tablet; Take 1 tablet (150 mg total) by mouth once for 1 dose.  Thrush, oral -     nystatin (MYCOSTATIN) 100000 UNIT/ML suspension; Take 5 mLs (500,000 Units total) by mouth 3 (three) times daily after meals. -     fluconazole (DIFLUCAN) 150 MG tablet; Take 1 tablet (150 mg total) by mouth once for 1 dose.   I am having [name] [name]. Partain start on nystatin and fluconazole. I am also having her maintain her Polyethyl Glycol-Propyl Glycol, latanoprost, fluticasone, Vitamin D3, aspirin EC, InFLIXimab (REMICADE IV), enoxaparin, atorvastatin, levalbuterol, furosemide, acetaminophen, saccharomyces boulardii, ondansetron, omega-3 acid ethyl esters, predniSONE, mycophenolate, sulfamethoxazole-trimethoprim, ALPRAZolam, triamcinolone cream, cetirizine, famotidine, montelukast, dapsone, HYDROcodone-acetaminophen, levofloxacin, and diclofenac sodium.  Meds ordered this encounter  Medications  . nystatin (MYCOSTATIN) 100000 UNIT/ML suspension    Sig: Take 5 mLs (500,000 Units total) by mouth 3 (three) times daily after meals.    Dispense:  60 mL    Refill:  1    Order Specific Question:   Supervising Provider    Answer:   Lucille Passy [[id number]]  . fluconazole (DIFLUCAN) 150 MG tablet    Sig: Take 1 tablet (150 mg total) by mouth once for 1 dose.    Dispense:  1 tablet    Refill:  0    Order Specific Question:   Supervising Provider    Answer:   Lucille Passy [[id number]]    Problem List Items Addressed This Visit      Digestive   Angular cheilitis - Primary   Relevant Medications   nystatin (MYCOSTATIN) 100000 UNIT/ML suspension   fluconazole (DIFLUCAN) 150 MG tablet    Other Visit Diagnoses    Thrush, oral       Relevant Medications   nystatin (MYCOSTATIN) 100000 UNIT/ML suspension   fluconazole (DIFLUCAN)  150 MG tablet       Follow-up: No follow-ups on file.  Wilfred Lacy, NP

## 2018-07-10 NOTE — Patient Instructions (Signed)
Do not eat of drink 12mins after taking nystatin  Oral Thrush, Adult  Oral thrush is an infection in your mouth and throat. It causes white patches on your tongue and in your mouth. Follow these instructions at home: Helping with soreness   To lessen your pain: ? Drink cold liquids, like water and iced tea. ? Eat frozen ice pops or frozen juices. ? Eat foods that are easy to swallow, like gelatin and ice cream. ? Drink from a straw if the patches in your mouth are painful. General instructions  Take or use over-the-counter and prescription medicines only as told by your doctor. Medicine for oral thrush may be something to swallow, or it may be something to put on the infected area.  Eat plain yogurt that has live cultures in it. Read the label to make sure.  If you wear dentures: ? Take out your dentures before you go to bed. ? Brush them well. ? Soak them in a denture cleaner.  Rinse your mouth with warm salt-water many times a day. To make the salt-water mixture, completely dissolve 1/2-1 teaspoon of salt in 1 cup of warm water. Contact a doctor if:  Your problems are getting worse.  Your problems do not get better in less than 7 days with treatment.  Your infection is spreading. This may show as white patches on the skin outside of your mouth.  You are nursing your baby and you have redness and pain in the nipples. This information is not intended to replace advice given to you by your health care provider. Make sure you discuss any questions you have with your health care provider. Document Released: 09/21/2009 Document Revised: 03/21/2016 Document Reviewed: 03/21/2016 Elsevier Interactive Patient Education  [employer].

## 2018-07-11 ENCOUNTER — Encounter: Payer: Self-pay | Admitting: Nurse Practitioner

## 2018-07-16 ENCOUNTER — Encounter: Payer: Self-pay | Admitting: Nurse Practitioner

## 2018-07-16 ENCOUNTER — Ambulatory Visit (INDEPENDENT_AMBULATORY_CARE_PROVIDER_SITE_OTHER): Payer: Medicare Other | Admitting: Nurse Practitioner

## 2018-07-16 ENCOUNTER — Ambulatory Visit: Payer: Medicare Other | Admitting: Nurse Practitioner

## 2018-07-16 VITALS — BP 106/60 | HR 99 | Wt 128.4 lb

## 2018-07-16 DIAGNOSIS — Z5181 Encounter for therapeutic drug level monitoring: Secondary | ICD-10-CM

## 2018-07-16 DIAGNOSIS — J849 Interstitial pulmonary disease, unspecified: Secondary | ICD-10-CM | POA: Diagnosis not present

## 2018-07-16 DIAGNOSIS — J9611 Chronic respiratory failure with hypoxia: Secondary | ICD-10-CM | POA: Diagnosis not present

## 2018-07-16 DIAGNOSIS — J8410 Pulmonary fibrosis, unspecified: Secondary | ICD-10-CM

## 2018-07-16 MED ORDER — DAPSONE 100 MG PO TABS: 100.0000 mg | ORAL_TABLET | Freq: Every day | ORAL | 1 refills | Status: DC

## 2018-07-16 NOTE — Assessment & Plan Note (Addendum)
Patient is not compliant with medications. We discussed the importance of cellcept and correct dosage.  We discussed the need for dapsone with the CellCept patient is agreeable to start taking it.  I will reorder the medication.  We discussed the importance of having labs checked monthly.  She states that she understands the importance of taking these medications and having her labs checked.  Patient Instructions  Continue Cellcept 2 tablets twice daily Continue Dapsone 1 tablet daily Will order CBC today and call with results - discussed with patient that she will need this checked every month Continue 2L of oxygen continuously  Follow up with Dr. Lake Bells at his first available appointment

## 2018-07-16 NOTE — Progress Notes (Signed)
@Patient  ID: [name] [name], female    DOB: 05-30-1931, 85 y.o.   MRN: [id number]  Chief Complaint  Patient presents with  . Follow-up    breathing about the same - rash still itches at times - had recent ED visit for chest pain    Referring provider: Hoyt Koch, *  Synopsis: This is a very pleasant female who is diagnosed with interstitial lung disease in her mid 44s after evaluation at [facility] and [facility]. She was found to have a UIP pattern on CT scan and serology testing revealed a very high rheumatoid factor as well as anti-CCP antibody. She was followed by Dr. Elgie Collard at [facility] who felt like she had rheumatoid arthritis associated interstitial lung disease. He never treated her with immunosuppressive therapy. From 2000 07/09/2013 she's continued to follow in both [place] and due to her lung function has remained stable during that time. She has not developed new shortness of breath. She has had repeated episodes of pulmonary emboli. The most recent was in the summer of 2014 when she developed her fourth pulmonary embolism after returning from a trip to [place]. In 2018 she had worsening so in addition to infliximab she was treated with prednisone daily. During that year she also started using oxygen 2 L continuously. Noted to have worsening fibrotic change in 2019 so we started Imuran. Stopped imuran due to nausea and started Cellcept in September 529   HPI 85 year old female with postinflammatory pulmonary fibrosis and chronic respiratory failure followed by Dr. Lake Bells.  Tests: CXR 06/18/18>>Chronic pulmonary fibrosis. No superimposed acute findings are identified.  PFT Results Latest Ref Rng & Units 10/13/2017 01/30/2017 01/15/2016 05/27/2015 04/29/2014  FVC-Pre L 1.62 1.89 2.13 1.94 1.93  FVC-Predicted Pre % 83 95 106 95 72  FVC-Post L 1.67 1.88 1.81 1.81 1.79  FVC-Predicted Post % 86 95 90 88 67  Pre FEV1/FVC % %  81 79 78 83 80  Post FEV1/FCV % % 81 85 78 87 86  FEV1-Pre L 1.32 1.50 1.65 1.61 1.55  FEV1-Predicted Pre % 87 97 105 101 78  FEV1-Post L 1.36 1.61 1.41 1.58 1.54  DLCO UNC% % 24 27 32 34 35  DLCO COR %Predicted % 46 50 47 60 65  TLC L 3.64 3.37 3.79 3.38 3.27  TLC % Predicted % 70 64 72 64 62  RV % Predicted % 66 61 70 54 56    OV 07/16/18 - follow up Patient presents for follow-up.  She was started on CellCept for rheumatoid arthritis associated interstitial lung disease and Bactrim in July.  She developed a rash after starting Bactrim and was ordered dapsone.  He did not start taking the CellCept around October.  The medications with patient she has not been taking it correctly.  She is only taken 1 tablet daily.  She should be taken 2 tablets twice daily.  She failed to get her dapsone prescription filled.  He is noncompliant with her medications and has stopped taking most of them.  She has not followed up on lab work for therapeutic drug monitoring and has canceled several appointments.  She states that she has been doing well on the CellCept.  She has not had any adverse reactions.  She denies any diarrhea or upset stomach.  She denies any recent fevers.  Is on 2 L O2 nasal cannula continuous.     Allergies  Allergen Reactions  . Crestor [Rosuvastatin] Other (See  Comments)    Muscle pain  . Lactose Intolerance (Gi) Other (See Comments)    Stomach upset  . Penicillins Hives  . Imuran [Azathioprine] Nausea And Vomiting  . Niacin Hives    Immunization History  Administered Date(s) Administered  . Influenza Split 05/09/2011  . Influenza Whole 04/10/2010, 04/10/2012  . Influenza, High Dose Seasonal PF 05/07/2017, 04/23/2018  . Influenza,inj,Quad PF,6+ Mos 07/25/2013, 04/29/2014, 04/16/2015  . Influenza-Unspecified 04/24/2016  . Pneumococcal Conjugate-13 04/16/2015  . Pneumococcal Polysaccharide-23 07/26/2017  . Tdap 11/12/2013  . Zoster 07/11/2013    Past Medical History:    Diagnosis Date  . Allergic rhinitis   . Allergy   . CAD (coronary artery disease)    Mild CAD by cath 2008  . DJD (degenerative joint disease)    rheumatoid  . DVT (deep venous thrombosis) ([place])   . Dyslipidemia   . History of nuclear stress test    Myoview 5/17:  EF 74%, normal perfusion, low risk  . History of pulmonary embolism   . Hyperlipidemia   . Insomnia   . Osteoporosis   . Pulmonary fibrosis ([place])   . Rheumatoid arthritis ([place])     Tobacco History: Social History   Tobacco Use  Smoking Status Former Smoker  . Packs/day: 0.30  . Years: 20.00  . Pack years: 6.00  . Types: Cigarettes  . Last attempt to quit: 07/11/1973  . Years since quitting: 45.0  Smokeless Tobacco Never Used   Counseling given: Yes   Outpatient Encounter Medications as of 07/16/2018  Medication Sig  . InFLIXimab (REMICADE IV) Inject into the vein.  . mycophenolate (CELLCEPT) 500 MG tablet 1 tab BID X1 week, then 2 tabs BID maintenance  . nystatin (MYCOSTATIN) 100000 UNIT/ML suspension Take 5 mLs (500,000 Units total) by mouth 3 (three) times daily after meals.  . predniSONE (DELTASONE) 5 MG tablet Take 5 mg by mouth daily with breakfast.  . triamcinolone cream (KENALOG) 0.1 % Apply 1 application topically 2 (two) times daily.  [name] acetaminophen (TYLENOL) 500 MG tablet Take 2 tablets (1,000 mg total) by mouth every 6 (six) hours as needed. (Patient not taking: Reported on 07/16/2018)  . ALPRAZolam (XANAX) 0.25 MG tablet Take 1 tablet (0.25 mg total) by mouth at bedtime as needed for anxiety. (Patient not taking: Reported on 07/16/2018)  . aspirin EC 81 MG tablet Take 1 tablet (81 mg total) by mouth daily. (Patient not taking: Reported on 07/16/2018)  . atorvastatin (LIPITOR) 10 MG tablet Take 1 tablet (10 mg total) by mouth daily. (Patient not taking: Reported on 07/16/2018)  . cetirizine (ZYRTEC) 10 MG tablet Take 1 tablet (10 mg total) by mouth 2 (two) times daily. (Patient not taking: Reported on  07/16/2018)  . Cholecalciferol (VITAMIN D3) 2000 UNITS capsule Take 1 capsule (2,000 Units total) by mouth daily. (Patient not taking: Reported on 07/16/2018)  . dapsone 100 MG tablet Take 1 tablet (100 mg total) by mouth daily.  . diclofenac sodium (VOLTAREN) 1 % GEL Apply 2 g topically 4 (four) times daily. (Patient not taking: Reported on 07/16/2018)  . enoxaparin (LOVENOX) 30 MG/0.3ML injection Inject 0.3 mLs (30 mg total) into the skin every 12 (twelve) hours as needed (travel only). (Patient not taking: Reported on 07/16/2018)  . famotidine (PEPCID) 20 MG tablet Take 1 tablet (20 mg total) by mouth 2 (two) times daily. (Patient not taking: Reported on 07/16/2018)  . fluticasone (FLONASE) 50 MCG/ACT nasal spray Place 2 sprays into both nostrils daily as needed for  allergies or rhinitis. (Patient not taking: Reported on 07/16/2018)  . furosemide (LASIX) 20 MG tablet Take 1 tablet by mouth daily as needed for swelling. (Patient not taking: Reported on 07/16/2018)  . HYDROcodone-acetaminophen (NORCO/VICODIN) 5-325 MG tablet Take 2 tablets by mouth every 6 (six) hours as needed for severe pain. (Patient not taking: Reported on 07/16/2018)  . latanoprost (XALATAN) 0.005 % ophthalmic solution Place 1 drop into both eyes at bedtime.   . levalbuterol (XOPENEX HFA) 45 MCG/ACT inhaler Inhale 2 puffs into the lungs every 6 (six) hours as needed for wheezing. (Patient not taking: Reported on 07/16/2018)  . levofloxacin (LEVAQUIN) 500 MG tablet Take 1 tablet (500 mg total) by mouth daily. (Patient not taking: Reported on 07/16/2018)  . montelukast (SINGULAIR) 10 MG tablet Take 1 tablet (10 mg total) by mouth at bedtime. (Patient not taking: Reported on 07/16/2018)  . omega-3 acid ethyl esters (LOVAZA) 1 g capsule Take 1 g by mouth daily.  . ondansetron (ZOFRAN) 4 MG tablet Take 1 tablet (4 mg total) by mouth every 8 (eight) hours as needed for nausea or vomiting. (Patient not taking: Reported on 07/16/2018)  . Polyethyl  Glycol-Propyl Glycol (SYSTANE) 0.4-0.3 % SOLN Apply 1 drop to eye daily as needed (for dry eyes).  . saccharomyces boulardii (FLORASTOR) 250 MG capsule Take 1 capsule (250 mg total) by mouth 2 (two) times daily. (Patient not taking: Reported on 07/16/2018)  . sulfamethoxazole-trimethoprim (BACTRIM DS,SEPTRA DS) 800-160 MG tablet TAKE 1 TABLET BY MOUTH THREE TIMES A WEEK ON MONDAY, WEDNESDAY, AND FRIDAY. (Patient not taking: Reported on 07/16/2018)  . [DISCONTINUED] dapsone 100 MG tablet Take 1 tablet (100 mg total) by mouth daily. (Patient not taking: Reported on 07/16/2018)   No facility-administered encounter medications on file as of 07/16/2018.      Review of Systems  Review of Systems  Constitutional: Negative.  Negative for chills and fever.  HENT: Negative.   Respiratory: Negative for cough, shortness of breath and wheezing.   Cardiovascular: Negative.  Negative for chest pain, palpitations and leg swelling.  Gastrointestinal: Negative.   Allergic/Immunologic: Negative.   Neurological: Negative.   Psychiatric/Behavioral: Negative.        Physical Exam  BP 106/60 (BP Location: Right Arm, Patient Position: Sitting, Cuff Size: Normal)   Pulse 99   Wt 128 lb 6.4 oz (58.2 kg)   SpO2 95%   BMI 22.04 kg/m   Wt Readings from Last 5 Encounters:  07/16/18 128 lb 6.4 oz (58.2 kg)  07/10/18 128 lb 3.2 oz (58.2 kg)  06/18/18 128 lb (58.1 kg)  05/23/18 132 lb 3.2 oz (60 kg)  04/27/18 133 lb (60.3 kg)     Physical Exam Vitals signs and nursing note reviewed.  Constitutional:      General: She is not in acute distress.    Appearance: She is well-developed.  Cardiovascular:     Rate and Rhythm: Normal rate and regular rhythm.  Pulmonary:     Effort: Pulmonary effort is normal. No respiratory distress.     Breath sounds: Normal breath sounds. No rhonchi.  Musculoskeletal:        General: No swelling.  Neurological:     Mental Status: She is alert and oriented to person, place, and  time.      Imaging: Dg Chest 2 View  Result Date: 06/18/2018 CLINICAL DATA:  85 year old female with cough and chest pain. EXAM: CHEST - 2 VIEW COMPARISON:  CTA chest 01/31/2018 and earlier. FINDINGS: Chronic lung disease with  pulmonary fibrosis, peripheral and lower lung honeycombing. Lung volumes and mediastinal contours are stable since February. No superimposed pneumothorax, pleural effusion or acute pulmonary opacity. Osteopenia. No acute osseous abnormality identified. Negative visible bowel gas pattern. IMPRESSION: Chronic pulmonary fibrosis. No superimposed acute findings are identified. Electronically Signed   By: Genevie Ann M.D.   On: 06/18/2018 14:59   Ct Angio Chest Pe W And/or Wo Contrast  Result Date: 06/18/2018 CLINICAL DATA:  Chest pain and cough. EXAM: CT ANGIOGRAPHY CHEST WITH CONTRAST TECHNIQUE: Multidetector CT imaging of the chest was performed using the standard protocol during bolus administration of intravenous contrast. Multiplanar CT image reconstructions and MIPs were obtained to evaluate the vascular anatomy. CONTRAST:  167mL ISOVUE-370 IOPAMIDOL (ISOVUE-370) INJECTION 76% COMPARISON:  Chest radiographs obtained earlier today and chest CTA dated 01/31/2018. FINDINGS: Cardiovascular: Normally opacified pulmonary arteries with no pulmonary arterial filling defects seen. Atheromatous calcifications, including the coronary arteries and aorta. Mediastinum/Nodes: Small hiatal hernia. Enlarged right hilar node with a mild increase in size with a short axis diameter of 14 mm on image number 50 series 4. No other enlarged lymph nodes are seen. Unremarkable thyroid gland. Lungs/Pleura: Bilateral bullous changes and extensive honeycombing without significant change. No pleural fluid. Upper Abdomen: Multiple colonic diverticula. Musculoskeletal: Mild thoracic and lower cervical spine degenerative changes. Review of the MIP images confirms the above findings. IMPRESSION: 1. No pulmonary  emboli or acute abnormality. 2. Stable changes of COPD and interstitial fibrosis with honeycombing. 3. Mild increase in size of a mildly enlarged right hilar node, most likely reactive. 4. Calcified coronary artery and aortic atherosclerosis. 5. Colonic diverticulosis. 6. Small hiatal hernia. Aortic Atherosclerosis (ICD10-I70.0) and Emphysema (ICD10-J43.9). Electronically Signed   By: Claudie Revering M.D.   On: 06/18/2018 20:53     Assessment & Plan:   Postinflammatory pulmonary fibrosis ([place]) Patient is not compliant with medications. We discussed the importance of cellcept and correct dosage.  We discussed the need for dapsone with the CellCept patient is agreeable to start taking it.  I will reorder the medication.  We discussed the importance of having labs checked monthly.  She states that she understands the importance of taking these medications and having her labs checked.  Patient Instructions  Continue Cellcept 2 tablets twice daily Continue Dapsone 1 tablet daily Will order CBC today and call with results - discussed with patient that she will need this checked every month Continue 2L of oxygen continuously  Follow up with Dr. Lake Bells at his first available appointment    Chronic respiratory failure with hypoxia ([place]) Continue O2 at 2L [place]   Therapeutic drug monitoring Will order labs today and call with results     Fenton Foy, NP 07/17/2018

## 2018-07-16 NOTE — Patient Instructions (Addendum)
Continue Cellcept 2 tablets twice daily Continue Dapsone 1 tablet daily Will order CBC today and call with results - discussed with patient that she will need this checked every month Continue 2L of oxygen continuously  Follow up with Dr. Lake Bells at his first available appointment

## 2018-07-17 ENCOUNTER — Encounter: Payer: Self-pay | Admitting: Nurse Practitioner

## 2018-07-17 DIAGNOSIS — Z5181 Encounter for therapeutic drug level monitoring: Secondary | ICD-10-CM | POA: Insufficient documentation

## 2018-07-17 LAB — HEPATIC FUNCTION PANEL
ALT: 9 U/L (ref 0–35)
AST: 17 U/L (ref 0–37)
Albumin: 3.3 g/dL — ABNORMAL LOW (ref 3.5–5.2)
Alkaline Phosphatase: 69 U/L (ref 39–117)
Bilirubin, Direct: 0 mg/dL (ref 0.0–0.3)
Total Bilirubin: 0.4 mg/dL (ref 0.2–1.2)
Total Protein: 7.2 g/dL (ref 6.0–8.3)

## 2018-07-17 LAB — CBC WITH DIFFERENTIAL/PLATELET
Basophils Absolute: 0.1 10*3/uL (ref 0.0–0.1)
Basophils Relative: 0.5 % (ref 0.0–3.0)
Eosinophils Absolute: 0.2 10*3/uL (ref 0.0–0.7)
Eosinophils Relative: 1.3 % (ref 0.0–5.0)
HCT: 39 % (ref 36.0–46.0)
HEMOGLOBIN: 13 g/dL (ref 12.0–15.0)
Lymphocytes Relative: 32.1 % (ref 12.0–46.0)
Lymphs Abs: 3.9 10*3/uL (ref 0.7–4.0)
MCHC: 33.5 g/dL (ref 30.0–36.0)
MCV: 89.1 fl (ref 78.0–100.0)
MONOS PCT: 4.4 % (ref 3.0–12.0)
Monocytes Absolute: 0.5 10*3/uL (ref 0.1–1.0)
Neutro Abs: 7.5 10*3/uL (ref 1.4–7.7)
Neutrophils Relative %: 61.7 % (ref 43.0–77.0)
Platelets: 176 10*3/uL (ref 150.0–400.0)
RBC: 4.37 Mil/uL (ref 3.87–5.11)
RDW: 13.8 % (ref 11.5–15.5)
WBC: 12.1 10*3/uL — ABNORMAL HIGH (ref 4.0–10.5)

## 2018-07-17 NOTE — Assessment & Plan Note (Signed)
Continue O2 at 2 L [place] 

## 2018-07-17 NOTE — Progress Notes (Signed)
Reviewed, agree 

## 2018-07-17 NOTE — Assessment & Plan Note (Signed)
Will order labs today and call with results

## 2018-07-18 ENCOUNTER — Telehealth: Payer: Self-pay | Admitting: Nurse Practitioner

## 2018-07-18 NOTE — Telephone Encounter (Signed)
Called and spoke with patient advised her of notes recorded by TN. Patient stated that she has not been taking her dapsone. She has not been on it. Patient stated that she did receive a letter in the mail from her pharmacy stating that they were sending it to her. TN please advise, thank you. This can be routed back to triage pool.

## 2018-07-19 DIAGNOSIS — L404 Guttate psoriasis: Secondary | ICD-10-CM | POA: Insufficient documentation

## 2018-07-19 NOTE — Telephone Encounter (Signed)
Notes recorded by Riley Kill, CMA on 07/18/2018 at 3:41 PM EST Called and spoke with patient, see phone note 07/18/2018 ------  Notes recorded by Nena Polio, CMA on 07/18/2018 at 1:47 PM EST LVM for patient to call back and confirm the medication is taken as directed. ------  Notes recorded by Fenton Foy, NP on 07/18/2018 at 1:37 PM EST Please call to let patient know that her labs overall were normal, but her WBC count was elevated. Please make sure that she is taking her dapsone as ordered.  Will close encounter

## 2018-07-19 NOTE — Telephone Encounter (Signed)
We discussed this at our last office visit. She needs to take the medication as directed because she is on Cellcept. I sent in a new prescription at last visit. Thanks.

## 2018-07-27 ENCOUNTER — Ambulatory Visit: Payer: Medicare Other | Admitting: Allergy

## 2018-07-30 ENCOUNTER — Ambulatory Visit: Payer: Medicare Other

## 2018-07-30 DIAGNOSIS — M3500 Sicca syndrome, unspecified: Secondary | ICD-10-CM | POA: Diagnosis not present

## 2018-07-30 DIAGNOSIS — R091 Pleurisy: Secondary | ICD-10-CM | POA: Diagnosis not present

## 2018-07-30 DIAGNOSIS — Z79899 Other long term (current) drug therapy: Secondary | ICD-10-CM | POA: Diagnosis not present

## 2018-07-30 DIAGNOSIS — M0579 Rheumatoid arthritis with rheumatoid factor of multiple sites without organ or systems involvement: Secondary | ICD-10-CM | POA: Diagnosis not present

## 2018-07-30 DIAGNOSIS — Z6821 Body mass index (BMI) 21.0-21.9, adult: Secondary | ICD-10-CM | POA: Diagnosis not present

## 2018-07-30 DIAGNOSIS — J849 Interstitial pulmonary disease, unspecified: Secondary | ICD-10-CM | POA: Diagnosis not present

## 2018-07-30 DIAGNOSIS — M2550 Pain in unspecified joint: Secondary | ICD-10-CM | POA: Diagnosis not present

## 2018-07-30 DIAGNOSIS — B370 Candidal stomatitis: Secondary | ICD-10-CM | POA: Diagnosis not present

## 2018-07-30 DIAGNOSIS — L409 Psoriasis, unspecified: Secondary | ICD-10-CM | POA: Diagnosis not present

## 2018-07-30 NOTE — Progress Notes (Signed)
Subjective:   [name] [name] is a 85 y.o. female who presents for Medicare Annual (Subsequent) preventive examination.  Review of Systems:  No ROS.  Medicare Wellness Visit. Additional risk factors are reflected in the social history.  Cardiac Risk Factors include: advanced age (>42men, >54 women);dyslipidemia Sleep patterns: gets up 1-2 times nightly to void and sleeps 6-7 hours nightly.    Home Safety/Smoke Alarms: Feels safe in home. Smoke alarms in place.  Living environment; residence and Firearm Safety: [place], can live on one level, no firearms. Lives alone, no needs for DME, good support system Seat Belt Safety/Bike Helmet: Wears seat belt.     Objective:     Vitals: BP 108/62   Pulse 93   Resp 17   Ht 5\' 4"  (1.626 m)   Wt 128 lb (58.1 kg)   SpO2 96%   BMI 21.97 kg/m   Body mass index is 21.97 kg/m.  Advanced Directives 07/31/2018 06/18/2018 08/18/2017 07/26/2017 07/21/2016 12/02/2014 09/06/2014  Does Patient Have a Medical Advance Directive? Yes No No Yes Yes Yes Yes  Type of Paramedic of [place];Living will - [place];Living will [place];Living will [place];Living will - [place];Living will  Does patient want to make changes to medical advance directive? - - No - Patient declined - - - -  Copy of [place] in Chart? No - copy requested - No - copy requested No - copy requested No - copy requested No - copy requested -  Would patient like information on creating a medical advance directive? - - No - Patient declined - - - -    Tobacco Social History   Tobacco Use  Smoking Status Former Smoker  . Packs/day: 0.30  . Years: 20.00  . Pack years: 6.00  . Types: Cigarettes  . Last attempt to quit: 07/11/1973  . Years since quitting: 45.0  Smokeless Tobacco Never Used     Counseling given: Not Answered  Past Medical History:  Diagnosis  Date  . Allergic rhinitis   . Allergy   . CAD (coronary artery disease)    Mild CAD by cath 2008  . DJD (degenerative joint disease)    rheumatoid  . DVT (deep venous thrombosis) ([place])   . Dyslipidemia   . History of nuclear stress test    Myoview 5/17:  EF 74%, normal perfusion, low risk  . History of pulmonary embolism   . Hyperlipidemia   . Insomnia   . Osteoporosis   . Pulmonary fibrosis ([place])   . Rheumatoid arthritis [facility])    Past Surgical History:  Procedure Laterality Date  . ABDOMINAL HYSTERECTOMY  1974  . APPENDECTOMY  1959  . CATARACT EXTRACTION    . LEFT HEART CATHETERIZATION WITH CORONARY ANGIOGRAM N/A 06/18/2013   Procedure: LEFT HEART CATHETERIZATION WITH CORONARY ANGIOGRAM;  Surgeon: Peter M Martinique, MD;  Location: [facility] CATH LAB;  Service: Cardiovascular;  Laterality: N/A;  . TONSILLECTOMY  1941, 1951  . TOTAL KNEE ARTHROPLASTY  1997, 2007   Bilateral   Family History  Problem Relation Age of Onset  . Kidney disease Daughter 5  . Cancer Daughter   . Heart attack Father 46  . Alzheimer's disease Sister   . Heart disease Sister   . Alzheimer's disease Sister    Social History   Socioeconomic History  . Marital status: Widowed    Spouse name: Not on file  . Number  of children: 2  . Years of education: Not on file  . Highest education level: Not on file  Occupational History  . 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 Financial resource strain: Not hard at all  . Food insecurity:    Worry: Never true    Inability: Never true  . Transportation needs:    Medical: No    Non-medical: No  Tobacco Use  . Smoking status: Former Smoker    Packs/day: 0.30    Years: 20.00    Pack years: 6.00    Types: Cigarettes    Last attempt to quit: 07/11/1973    Years since quitting: 45.0  . Smokeless tobacco: Never Used  Substance and Sexual Activity  . Alcohol use: No  . Drug use: No  . Sexual activity: Never  Lifestyle  .  Physical activity:    Days per week: 0 days    Minutes per session: 0 min  . Stress: Only a little  Relationships  . Social connections:    Talks on phone: More than three times a week    Gets together: More than three times a week    Attends religious service: More than 4 times per year    Active member of club or organization: Yes    Attends meetings of clubs or organizations: More than 4 times per year    Relationship status: Widowed  Other Topics Concern  . Not on file  Social History Narrative   Diet:   Do you drink/eat things with caffeine? Yes   Marital status:        Widowed                      What year were you married?1954   Do you live in a house, apartment, assisted living, condo, trailer, etc)? House   Is it one or more stories? 1 1/4   How many persons live in your home?1   Do you have any pets in your home? No   Current or past profession:  Publishing rights manager   Do you exercise?        Yes                                            Type & how often:  Walk                                    Do you have a living will? Yes   Do you have a DNR Form? Yes   Do you have a POA/HPOA forms?  Yes    Outpatient Encounter Medications as of 07/31/2018  Medication Sig  . acetaminophen (TYLENOL) 500 MG tablet Take 2 tablets (1,000 mg total) by mouth every 6 (six) hours as needed.  . ALPRAZolam (XANAX) 0.25 MG tablet Take 1 tablet (0.25 mg total) by mouth at bedtime as needed for anxiety.  [name] aspirin EC 81 MG tablet Take 1 tablet (81 mg total) by mouth daily.  [name] atorvastatin (LIPITOR) 10 MG tablet Take 1 tablet (10 mg total) by mouth daily.  . dapsone 100 MG tablet Take 1 tablet (100 mg total) by mouth daily.  [name] enoxaparin (LOVENOX) 30 MG/0.3ML injection Inject 0.3 mLs (30  mg total) into the skin every 12 (twelve) hours as needed (travel only).  . fluticasone (FLONASE) 50 MCG/ACT nasal spray Place 2 sprays into both nostrils daily as needed for allergies or rhinitis.  .  furosemide (LASIX) 20 MG tablet Take 1 tablet by mouth daily as needed for swelling.  [name] HYDROcodone-acetaminophen (NORCO/VICODIN) 5-325 MG tablet Take 2 tablets by mouth every 6 (six) hours as needed for severe pain.  . InFLIXimab (REMICADE IV) Inject into the vein.  [name] latanoprost (XALATAN) 0.005 % ophthalmic solution Place 1 drop into both eyes at bedtime.   . levalbuterol (XOPENEX HFA) 45 MCG/ACT inhaler Inhale 2 puffs into the lungs every 6 (six) hours as needed for wheezing.  . mycophenolate (CELLCEPT) 500 MG tablet 1 tab BID X1 week, then 2 tabs BID maintenance  . Polyethyl Glycol-Propyl Glycol (SYSTANE) 0.4-0.3 % SOLN Apply 1 drop to eye daily as needed (for dry eyes).  . predniSONE (DELTASONE) 5 MG tablet Take 5 mg by mouth daily with breakfast.  . triamcinolone cream (KENALOG) 0.1 % Apply 1 application topically 2 (two) times daily.  . [DISCONTINUED] ondansetron (ZOFRAN) 4 MG tablet Take 1 tablet (4 mg total) by mouth every 8 (eight) hours as needed for nausea or vomiting.  . [DISCONTINUED] cetirizine (ZYRTEC) 10 MG tablet Take 1 tablet (10 mg total) by mouth 2 (two) times daily. (Patient not taking: Reported on 07/16/2018)  . [DISCONTINUED] Cholecalciferol (VITAMIN D3) 2000 UNITS capsule Take 1 capsule (2,000 Units total) by mouth daily. (Patient not taking: Reported on 07/16/2018)  . [DISCONTINUED] diclofenac sodium (VOLTAREN) 1 % GEL Apply 2 g topically 4 (four) times daily. (Patient not taking: Reported on 07/16/2018)  . [DISCONTINUED] famotidine (PEPCID) 20 MG tablet Take 1 tablet (20 mg total) by mouth 2 (two) times daily. (Patient not taking: Reported on 07/16/2018)  . [DISCONTINUED] levofloxacin (LEVAQUIN) 500 MG tablet Take 1 tablet (500 mg total) by mouth daily. (Patient not taking: Reported on 07/16/2018)  . [DISCONTINUED] montelukast (SINGULAIR) 10 MG tablet Take 1 tablet (10 mg total) by mouth at bedtime. (Patient not taking: Reported on 07/16/2018)  . [DISCONTINUED] nystatin (MYCOSTATIN)  100000 UNIT/ML suspension Take 5 mLs (500,000 Units total) by mouth 3 (three) times daily after meals. (Patient not taking: Reported on 07/31/2018)  . [DISCONTINUED] omega-3 acid ethyl esters (LOVAZA) 1 g capsule Take 1 g by mouth daily.  . [DISCONTINUED] saccharomyces boulardii (FLORASTOR) 250 MG capsule Take 1 capsule (250 mg total) by mouth 2 (two) times daily. (Patient not taking: Reported on 07/16/2018)  . [DISCONTINUED] sulfamethoxazole-trimethoprim (BACTRIM DS,SEPTRA DS) 800-160 MG tablet TAKE 1 TABLET BY MOUTH THREE TIMES A WEEK ON MONDAY, WEDNESDAY, AND FRIDAY. (Patient not taking: Reported on 07/16/2018)   No facility-administered encounter medications on file as of 07/31/2018.     Activities of Daily Living In your present state of health, do you have any difficulty performing the following activities: 07/31/2018  Hearing? N  Vision? N  Difficulty concentrating or making decisions? Y  Walking or climbing stairs? N  Dressing or bathing? N  Doing errands, shopping? N  Preparing Food and eating ? Y  Using the Toilet? N  In the past six months, have you accidently leaked urine? Y  Comment followed by urology  Do you have problems with loss of bowel control? N  Managing your Medications? N  Managing your Finances? N  Housekeeping or managing your Housekeeping? Y  Some recent data might be hidden    Patient Care Team: Hoyt Koch,  MD as PCP - General (Internal Medicine) Burnell Blanks, MD as PCP - Cardiology (Cardiology) Burnell Blanks, MD as Consulting Physician (Cardiology) Marygrace Drought, MD as Consulting Physician (Ophthalmology) Juanito Doom, MD as Consulting Physician (Pulmonary Disease) Gavin Pound, MD as Consulting Physician (Rheumatology)    Assessment:   This is a routine wellness examination for Rani. Physical assessment deferred to PCP.  Exercise Activities and Dietary recommendations Current Exercise Habits: The patient does not  participate in regular exercise at present(chair exercise print-out provided), Exercise limited by: orthopedic condition(s);respiratory conditions(s)  Diet (meal preparation, eat out, water intake, caffeinated beverages, dairy products, fruits and vegetables): in general, a "healthy" diet  Reports poor appetite.   Reviewed heart healthy and diabetic diet.  Discussed supplementing with Ensure, samples and coupons provided. Encouraged patient to increase daily water and healthy fluid intake.  Goals    . Exercise 3x per week (30 min per time)     Increase physical activity.     . Patient Stated     Increase the amount water intake daily. Place 2 bottles of water out daily to remind me to drink. Increase physical activity by using my exercise equipment x3 weekly 10-20 minutes and/or exercise while I watch the news. Go to bed earlier, I will cut the news off by 12:00    . Patient Stated     I want to increase my physical activity by doing chair exercises.       Fall Risk Fall Risk  07/31/2018 08/11/2017 07/21/2016 06/01/2016 03/07/2016  Falls in the past year? 1 No No No No  Comment - - - - Emmi Telephone Survey: data to providers prior to load  Number falls in past yr: 0 - - - -  Injury with Fall? 1 - - - -  Risk for fall due to : Impaired balance/gait;Impaired mobility - - - -  Follow up Falls prevention discussed;Education provided - - - -   Depression Screen PHQ 2/9 Scores 07/31/2018 08/11/2017 07/21/2016 06/01/2016  PHQ - 2 Score 0 0 0 0  PHQ- 9 Score 3 - - -     Cognitive Function MMSE - Mini Mental State Exam 07/31/2018 07/26/2017  Orientation to time 5 5  Orientation to Place 5 5  Registration 3 3  Attention/ Calculation 5 5  Recall 0 1  Language- name 2 objects 2 2  Language- repeat 1 1  Language- follow 3 step command 3 3  Language- read & follow direction 1 1  Write a sentence 1 1  Copy design 1 1  Total score 27 28        Immunization History  Administered Date(s)  Administered  . Influenza Split 05/09/2011  . Influenza Whole 04/10/2010, 04/10/2012  . Influenza, High Dose Seasonal PF 05/07/2017, 04/23/2018  . Influenza,inj,Quad PF,6+ Mos 07/25/2013, 04/29/2014, 04/16/2015  . Influenza-Unspecified 04/24/2016  . Pneumococcal Conjugate-13 04/16/2015  . Pneumococcal Polysaccharide-23 07/26/2017  . Tdap 11/12/2013  . Zoster 07/11/2013   Screening Tests Health Maintenance  Topic Date Due  . TETANUS/TDAP  11/13/2023  . INFLUENZA VACCINE  Completed  . DEXA SCAN  Completed  . PNA vac Low Risk Adult  Completed      Plan:     Reviewed health maintenance screenings with patient today and relevant education, vaccines, and/or referrals were provided.   Continue doing brain stimulating activities (puzzles, reading, adult coloring books, staying active) to keep memory sharp.   Continue to eat heart healthy diet (full of fruits,  vegetables, whole grains, lean protein, water--limit salt, fat, and sugar intake) and increase physical activity as tolerated.  I have personally reviewed and noted the following in the patient's chart:   . Medical and social history . Use of alcohol, tobacco or illicit drugs  . Current medications and supplements . Functional ability and status . Nutritional status . Physical activity . Advanced directives . List of other physicians . Vitals . Screenings to include cognitive, depression, and falls . Referrals and appointments  In addition, I have reviewed and discussed with patient certain preventive protocols, quality metrics, and best practice recommendations. A written personalized care plan for preventive services as well as general preventive health recommendations were provided to patient.     Michiel Cowboy, RN  07/31/2018

## 2018-07-31 ENCOUNTER — Ambulatory Visit (INDEPENDENT_AMBULATORY_CARE_PROVIDER_SITE_OTHER): Payer: Medicare Other | Admitting: *Deleted

## 2018-07-31 VITALS — BP 108/62 | HR 93 | Resp 17 | Ht 64.0 in | Wt 128.0 lb

## 2018-07-31 DIAGNOSIS — Z0000 Encounter for general adult medical examination without abnormal findings: Secondary | ICD-10-CM

## 2018-07-31 NOTE — Progress Notes (Signed)
Medical screening examination/treatment/procedure(s) were performed by non-physician practitioner and as supervising physician I was immediately available for consultation/collaboration. I agree with above. Elizabeth A Crawford, MD 

## 2018-07-31 NOTE — Patient Instructions (Addendum)
Continue doing brain stimulating activities (puzzles, reading, adult coloring books, staying active) to keep memory sharp.   Continue to eat heart healthy diet (full of fruits, vegetables, whole grains, lean protein, water--limit salt, fat, and sugar intake) and increase physical activity as tolerated.   Ms. [name] [name] , Thank you for taking time to come for your Medicare Wellness Visit. I appreciate your ongoing commitment to your health goals. Please review the following plan we discussed and let me know if I can assist you in the future.   These are the goals we discussed: Goals    . Exercise 3x per week (30 min per time)     Increase physical activity.     . Patient Stated     Increase the amount water intake daily. Place 2 bottles of water out daily to remind me to drink. Increase physical activity by using my exercise equipment x3 weekly 10-20 minutes and/or exercise while I watch the news. Go to bed earlier, I will cut the news off by 12:00    . Patient Stated     I want to increase my physical activity by doing chair exercises.       This is a list of the screening recommended for you and due dates:  Health Maintenance  Topic Date Due  . Tetanus Vaccine  11/13/2023  . Flu Shot  Completed  . DEXA scan (bone density measurement)  Completed  . Pneumonia vaccines  Completed   Health Maintenance, Female Adopting a healthy lifestyle and getting preventive care can go a long way to promote health and wellness. Talk with your health care provider about what schedule of regular examinations is right for you. This is a good chance for you to check in with your provider about disease prevention and staying healthy. In between checkups, there are plenty of things you can do on your own. Experts have done a lot of research about which lifestyle changes and preventive measures are most likely to keep you healthy. Ask your health care provider for more information. Weight and diet Eat a healthy  diet  Be sure to include plenty of vegetables, fruits, low-fat dairy products, and lean protein.  Do not eat a lot of foods high in solid fats, added sugars, or salt.  Get regular exercise. This is one of the most important things you can do for your health. ? Most adults should exercise for at least 150 minutes each week. The exercise should increase your heart rate and make you sweat (moderate-intensity exercise). ? Most adults should also do strengthening exercises at least twice a week. This is in addition to the moderate-intensity exercise. Maintain a healthy weight  Body mass index (BMI) is a measurement that can be used to identify possible weight problems. It estimates body fat based on height and weight. Your health care provider can help determine your BMI and help you achieve or maintain a healthy weight.  For females 57 years of age and older: ? A BMI below 18.5 is considered underweight. ? A BMI of 18.5 to 24.9 is normal. ? A BMI of 25 to 29.9 is considered overweight. ? A BMI of 30 and above is considered obese. Watch levels of cholesterol and blood lipids  You should start having your blood tested for lipids and cholesterol at 83 years of age, then have this test every 5 years.  You may need to have your cholesterol levels checked more often if: ? Your lipid or cholesterol levels are  high. ? You are older than 83 years of age. ? You are at high risk for heart disease. Cancer screening Lung Cancer  Lung cancer screening is recommended for adults 47-58 years old who are at high risk for lung cancer because of a history of smoking.  A yearly low-dose CT scan of the lungs is recommended for people who: ? Currently smoke. ? Have quit within the past 15 years. ? Have at least a 30-pack-year history of smoking. A pack year is smoking an average of one pack of cigarettes a day for 1 year.  Yearly screening should continue until it has been 15 years since you quit.  Yearly  screening should stop if you develop a health problem that would prevent you from having lung cancer treatment. Breast Cancer  Practice breast self-awareness. This means understanding how your breasts normally appear and feel.  It also means doing regular breast self-exams. Let your health care provider know about any changes, no matter how small.  If you are in your 20s or 30s, you should have a clinical breast exam (CBE) by a health care provider every 1-3 years as part of a regular health exam.  If you are 61 or older, have a CBE every year. Also consider having a breast X-ray (mammogram) every year.  If you have a family history of breast cancer, talk to your health care provider about genetic screening.  If you are at high risk for breast cancer, talk to your health care provider about having an MRI and a mammogram every year.  Breast cancer gene (BRCA) assessment is recommended for women who have family members with BRCA-related cancers. BRCA-related cancers include: ? Breast. ? Ovarian. ? Tubal. ? Peritoneal cancers.  Results of the assessment will determine the need for genetic counseling and BRCA1 and BRCA2 testing. Cervical Cancer Your health care provider may recommend that you be screened regularly for cancer of the pelvic organs (ovaries, uterus, and vagina). This screening involves a pelvic examination, including checking for microscopic changes to the surface of your cervix (Pap test). You may be encouraged to have this screening done every 3 years, beginning at age 4.  For women ages 97-65, health care providers may recommend pelvic exams and Pap testing every 3 years, or they may recommend the Pap and pelvic exam, combined with testing for human papilloma virus (HPV), every 5 years. Some types of HPV increase your risk of cervical cancer. Testing for HPV may also be done on women of any age with unclear Pap test results.  Other health care providers may not recommend any  screening for nonpregnant women who are considered low risk for pelvic cancer and who do not have symptoms. Ask your health care provider if a screening pelvic exam is right for you.  If you have had past treatment for cervical cancer or a condition that could lead to cancer, you need Pap tests and screening for cancer for at least 20 years after your treatment. If Pap tests have been discontinued, your risk factors (such as having a new sexual partner) need to be reassessed to determine if screening should resume. Some women have medical problems that increase the chance of getting cervical cancer. In these cases, your health care provider may recommend more frequent screening and Pap tests. Colorectal Cancer  This type of cancer can be detected and often prevented.  Routine colorectal cancer screening usually begins at 83 years of age and continues through 83 years of age.  Your health care provider may recommend screening at an earlier age if you have risk factors for colon cancer.  Your health care provider may also recommend using home test kits to check for hidden blood in the stool.  A small camera at the end of a tube can be used to examine your colon directly (sigmoidoscopy or colonoscopy). This is done to check for the earliest forms of colorectal cancer.  Routine screening usually begins at age 41.  Direct examination of the colon should be repeated every 5-10 years through 83 years of age. However, you may need to be screened more often if early forms of precancerous polyps or small growths are found. Skin Cancer  Check your skin from head to toe regularly.  Tell your health care provider about any new moles or changes in moles, especially if there is a change in a mole's shape or color.  Also tell your health care provider if you have a mole that is larger than the size of a pencil eraser.  Always use sunscreen. Apply sunscreen liberally and repeatedly throughout the  day.  Protect yourself by wearing long sleeves, pants, a wide-brimmed hat, and sunglasses whenever you are outside. Heart disease, diabetes, and high blood pressure  High blood pressure causes heart disease and increases the risk of stroke. High blood pressure is more likely to develop in: ? People who have blood pressure in the high end of the normal range (130-139/85-89 mm Hg). ? People who are overweight or obese. ? People who are African American.  If you are 58-35 years of age, have your blood pressure checked every 3-5 years. If you are 18 years of age or older, have your blood pressure checked every year. You should have your blood pressure measured twice-once when you are at a hospital or clinic, and once when you are not at a hospital or clinic. Record the average of the two measurements. To check your blood pressure when you are not at a hospital or clinic, you can use: ? An automated blood pressure machine at a pharmacy. ? A home blood pressure monitor.  If you are between 13 years and 88 years old, ask your health care provider if you should take aspirin to prevent strokes.  Have regular diabetes screenings. This involves taking a blood sample to check your fasting blood sugar level. ? If you are at a normal weight and have a low risk for diabetes, have this test once every three years after 83 years of age. ? If you are overweight and have a high risk for diabetes, consider being tested at a younger age or more often. Preventing infection Hepatitis B  If you have a higher risk for hepatitis B, you should be screened for this virus. You are considered at high risk for hepatitis B if: ? You were born in a country where hepatitis B is common. Ask your health care provider which countries are considered high risk. ? Your parents were born in a high-risk country, and you have not been immunized against hepatitis B (hepatitis B vaccine). ? You have HIV or AIDS. ? You use needles to  inject street drugs. ? You live with someone who has hepatitis B. ? You have had sex with someone who has hepatitis B. ? You get hemodialysis treatment. ? You take certain medicines for conditions, including cancer, organ transplantation, and autoimmune conditions. Hepatitis C  Blood testing is recommended for: ? Everyone born from [birth year] through 1963. ? Anyone  with known risk factors for hepatitis C. Sexually transmitted infections (STIs)  You should be screened for sexually transmitted infections (STIs) including gonorrhea and chlamydia if: ? You are sexually active and are younger than 83 years of age. ? You are older than 83 years of age and your health care provider tells you that you are at risk for this type of infection. ? Your sexual activity has changed since you were last screened and you are at an increased risk for chlamydia or gonorrhea. Ask your health care provider if you are at risk.  If you do not have HIV, but are at risk, it may be recommended that you take a prescription medicine daily to prevent HIV infection. This is called pre-exposure prophylaxis (PrEP). You are considered at risk if: ? You are sexually active and do not regularly use condoms or know the HIV status of your partner(s). ? You take drugs by injection. ? You are sexually active with a partner who has HIV. Talk with your health care provider about whether you are at high risk of being infected with HIV. If you choose to begin PrEP, you should first be tested for HIV. You should then be tested every 3 months for as long as you are taking PrEP. Pregnancy  If you are premenopausal and you may become pregnant, ask your health care provider about preconception counseling.  If you may become pregnant, take 400 to 800 micrograms (mcg) of folic acid every day.  If you want to prevent pregnancy, talk to your health care provider about birth control (contraception). Osteoporosis and menopause  Osteoporosis is a  disease in which the bones lose minerals and strength with aging. This can result in serious bone fractures. Your risk for osteoporosis can be identified using a bone density scan.  If you are 56 years of age or older, or if you are at risk for osteoporosis and fractures, ask your health care provider if you should be screened.  Ask your health care provider whether you should take a calcium or vitamin D supplement to lower your risk for osteoporosis.  Menopause may have certain physical symptoms and risks.  Hormone replacement therapy may reduce some of these symptoms and risks. Talk to your health care provider about whether hormone replacement therapy is right for you. Follow these instructions at home:  Schedule regular health, dental, and eye exams.  Stay current with your immunizations.  Do not use any tobacco products including cigarettes, chewing tobacco, or electronic cigarettes.  If you are pregnant, do not drink alcohol.  If you are breastfeeding, limit how much and how often you drink alcohol.  Limit alcohol intake to no more than 1 drink per day for nonpregnant women. One drink equals 12 ounces of beer, 5 ounces of Deniece Rankin, or 1 ounces of hard liquor.  Do not use street drugs.  Do not share needles.  Ask your health care provider for help if you need support or information about quitting drugs.  Tell your health care provider if you often feel depressed.  Tell your health care provider if you have ever been abused or do not feel safe at home. This information is not intended to replace advice given to you by your health care provider. Make sure you discuss any questions you have with your health care provider. Document Released: 01/10/2011 Document Revised: 12/03/2015 Document Reviewed: 03/31/2015 Elsevier Interactive Patient Education  2019 [employer].

## 2018-08-07 DIAGNOSIS — Z79899 Other long term (current) drug therapy: Secondary | ICD-10-CM | POA: Diagnosis not present

## 2018-08-07 DIAGNOSIS — M0579 Rheumatoid arthritis with rheumatoid factor of multiple sites without organ or systems involvement: Secondary | ICD-10-CM | POA: Diagnosis not present

## 2018-08-15 DIAGNOSIS — N3020 Other chronic cystitis without hematuria: Secondary | ICD-10-CM | POA: Diagnosis not present

## 2018-08-15 DIAGNOSIS — R310 Gross hematuria: Secondary | ICD-10-CM | POA: Diagnosis not present

## 2018-08-30 ENCOUNTER — Other Ambulatory Visit: Payer: Self-pay

## 2018-08-30 MED ORDER — MYCOPHENOLATE MOFETIL 500 MG PO TABS: ORAL_TABLET | ORAL | 3 refills | Status: DC

## 2018-09-04 ENCOUNTER — Telehealth: Payer: Self-pay | Admitting: Pulmonary Disease

## 2018-09-04 DIAGNOSIS — Z5181 Encounter for therapeutic drug level monitoring: Secondary | ICD-10-CM

## 2018-09-04 NOTE — Telephone Encounter (Signed)
Cbc, cmet therapeutic drug monitoring

## 2018-09-04 NOTE — Telephone Encounter (Signed)
Spoke with patient. She will be seeing BQ on 2/27 and needed to have labs done before then. She wanted to know if she came today or tomorrow would the results be back, advised her that if I ordered them stat, they would be.    Per chart, she needs a CBC. BQ, please advise if she needs any other labs. Thanks!

## 2018-09-04 NOTE — Telephone Encounter (Signed)
Called pt and advised message from the provider. Pt understood and verbalized understanding. Nothing further is needed.   Labs were placed.

## 2018-09-05 ENCOUNTER — Other Ambulatory Visit (INDEPENDENT_AMBULATORY_CARE_PROVIDER_SITE_OTHER): Payer: Medicare Other

## 2018-09-05 DIAGNOSIS — Z5181 Encounter for therapeutic drug level monitoring: Secondary | ICD-10-CM

## 2018-09-05 LAB — CBC WITH DIFFERENTIAL/PLATELET
Basophils Absolute: 0.1 10*3/uL (ref 0.0–0.1)
Basophils Relative: 0.8 % (ref 0.0–3.0)
Eosinophils Absolute: 0.2 10*3/uL (ref 0.0–0.7)
Eosinophils Relative: 1.8 % (ref 0.0–5.0)
HCT: 31.8 % — ABNORMAL LOW (ref 36.0–46.0)
Hemoglobin: 10.4 g/dL — ABNORMAL LOW (ref 12.0–15.0)
Lymphocytes Relative: 47 % — ABNORMAL HIGH (ref 12.0–46.0)
Lymphs Abs: 4.4 10*3/uL — ABNORMAL HIGH (ref 0.7–4.0)
MCHC: 32.8 g/dL (ref 30.0–36.0)
MCV: 94.6 fl (ref 78.0–100.0)
Monocytes Absolute: 1 10*3/uL (ref 0.1–1.0)
Monocytes Relative: 11.3 % (ref 3.0–12.0)
Neutro Abs: 3.6 10*3/uL (ref 1.4–7.7)
Neutrophils Relative %: 39.1 % — ABNORMAL LOW (ref 43.0–77.0)
Platelets: 181 10*3/uL (ref 150.0–400.0)
RBC: 3.36 Mil/uL — ABNORMAL LOW (ref 3.87–5.11)
RDW: 18 % — ABNORMAL HIGH (ref 11.5–15.5)
WBC: 9.3 10*3/uL (ref 4.0–10.5)

## 2018-09-05 LAB — COMPREHENSIVE METABOLIC PANEL
ALT: 10 U/L (ref 0–35)
AST: 17 U/L (ref 0–37)
Albumin: 3.7 g/dL (ref 3.5–5.2)
Alkaline Phosphatase: 66 U/L (ref 39–117)
BUN: 13 mg/dL (ref 6–23)
CO2: 26 mEq/L (ref 19–32)
Calcium: 9 mg/dL (ref 8.4–10.5)
Chloride: 103 mEq/L (ref 96–112)
Creatinine, Ser: 0.74 mg/dL (ref 0.40–1.20)
GFR: 90.06 mL/min (ref >60.00)
Glucose, Bld: 99 mg/dL (ref 70–99)
Potassium: 3.2 mEq/L — ABNORMAL LOW (ref 3.5–5.1)
Sodium: 138 mEq/L (ref 135–145)
TOTAL PROTEIN: 7.2 g/dL (ref 6.0–8.3)
Total Bilirubin: 1.2 mg/dL (ref 0.2–1.2)

## 2018-09-06 ENCOUNTER — Encounter: Payer: Self-pay | Admitting: Nurse Practitioner

## 2018-09-06 ENCOUNTER — Ambulatory Visit: Payer: Medicare Other | Admitting: Pulmonary Disease

## 2018-09-06 ENCOUNTER — Ambulatory Visit (INDEPENDENT_AMBULATORY_CARE_PROVIDER_SITE_OTHER): Payer: Medicare Other | Admitting: Nurse Practitioner

## 2018-09-06 VITALS — BP 104/58 | HR 66

## 2018-09-06 DIAGNOSIS — J8410 Pulmonary fibrosis, unspecified: Secondary | ICD-10-CM

## 2018-09-06 DIAGNOSIS — Z5181 Encounter for therapeutic drug level monitoring: Secondary | ICD-10-CM | POA: Diagnosis not present

## 2018-09-06 NOTE — Progress Notes (Signed)
Reviewed, agree 

## 2018-09-06 NOTE — Assessment & Plan Note (Signed)
Patient presents today for routine follow-up.  She was started on CellCept for rheumatoid arthritis associated interstitial lung disease in October along with dapsone.  This is been a stable interval for patient - she was last seen by me on 07/16/2018.  She has been taking her medications as directed since last visit.  Has been doing well on CellCept. She had labs checked yesterday.  Discussed that her potassium was low and she needs to increase potassium in her diet.  Handout was given to patient today.  Patient's hemoglobin was low as well.  She states that she has seen blood in her stool recently.  Advised patient to call PCP today to have stool checked. She agreed.   Patient Instructions  Continue Cellcept Continue dapsone Will continue to monitor labs closely Will recheck labs in 2 weeks and call with results Potassium was low - please eat diet high in potassium HGB was lower - please contact PCP  - patient noted recent blood in stool Follow up with Dr. Lake Bells at his first available appointment    Hypokalemia Hypokalemia means that the amount of potassium in the blood is lower than normal.Potassium is a chemical that helps regulate the amount of fluid in the body (electrolyte). It also stimulates muscle tightening (contraction) and helps nerves work properly.Normally, most of the body's potassium is inside of cells, and only a very small amount is in the blood. Because the amount in the blood is so small, minor changes to potassium levels in the blood can be life-threatening. What are the causes? This condition may be caused by:  Antibiotic medicine.  Diarrhea or vomiting. Taking too much of a medicine that helps you have a bowel movement (laxative) can cause diarrhea and lead to hypokalemia.  Chronic kidney disease (CKD).  Medicines that help the body get rid of excess fluid (diuretics).  Eating disorders, such as bulimia.  Low magnesium levels in the body.  Sweating a lot. What  are the signs or symptoms? Symptoms of this condition include:  Weakness.  Constipation.  Fatigue.  Muscle cramps.  Mental confusion.  Skipped heartbeats or irregular heartbeat (palpitations).  Tingling or numbness. How is this diagnosed? This condition is diagnosed with a blood test. How is this treated? Hypokalemia can be treated by taking potassium supplements by mouth or adjusting the medicines that you take. Treatment may also include eating more foods that contain a lot of potassium. If your potassium level is very low, you may need to get potassium through an IV tube in one of your veins and be monitored in the hospital. Follow these instructions at home:   Take over-the-counter and prescription medicines only as told by your health care provider. This includes vitamins and supplements.  Eat a healthy diet. A healthy diet includes fresh fruits and vegetables, whole grains, healthy fats, and lean proteins.  If instructed, eat more foods that contain a lot of potassium, such as: ? Nuts, such as peanuts and pistachios. ? Seeds, such as sunflower seeds and pumpkin seeds. ? Peas, lentils, and lima beans. ? Whole grain and bran cereals and breads. ? Fresh fruits and vegetables, such as apricots, avocado, bananas, cantaloupe, kiwi, oranges, tomatoes, asparagus, and potatoes. ? Orange juice. ? Tomato juice. ? Red meats. ? Yogurt.  Keep all follow-up visits as told by your health care provider. This is important. Contact a health care provider if:  You have weakness that gets worse.  You feel your heart pounding or racing.  You vomit.  You have diarrhea.  You have diabetes (diabetes mellitus) and you have trouble keeping your blood sugar (glucose) in your target range. Get help right away if:  You have chest pain.  You have shortness of breath.  You have vomiting or diarrhea that lasts for more than 2 days.  You faint. This information is not intended to  replace advice given to you by your health care provider. Make sure you discuss any questions you have with your health care provider. Document Released: 06/27/2005 Document Revised: 02/13/2016 Document Reviewed: 02/13/2016 Elsevier Interactive Patient Education  2019 [employer].

## 2018-09-06 NOTE — Progress Notes (Signed)
@Patient  ID: [name] [name], female    DOB: [birth year], 85 y.o.   MRN: [id number]  Chief Complaint  Patient presents with  . Follow-up    cellcept/drug monitoring    Referring provider: Hoyt Koch, *  HPI Synopsis: This is a very pleasant female who is diagnosed with interstitial lung disease in her mid 49s after evaluation at [facility] and [facility]. She was found to have a UIP pattern on CT scan and serology testing revealed a very high rheumatoid factor as well as anti-CCP antibody. She was followed by Dr. Elgie Collard at [facility] who felt like she had rheumatoid arthritis associated interstitial lung disease. He never treated her with immunosuppressive therapy. From 2000 07/09/2013 she's continued to follow in both [place] and due to her lung function has remained stable during that time. She has not developed new shortness of breath. She has had repeated episodes of pulmonary emboli. The most recent was in the summer of 2014 when she developed her fourth pulmonary embolism after returning from a trip to [place]. In 2018 she had worsening so in addition to infliximab she was treated with prednisone daily. During that year she also started using oxygen 2 L continuously. Noted to have worsening fibrotic change in 2019 so we started Imuran. Stopped imuran due to nausea and started Cellcept in October 725   HPI 85 year old female with postinflammatory pulmonary fibrosis and chronic respiratory failure followed by Dr. Lake Bells.  Tests: CXR 06/18/18>>Chronic pulmonary fibrosis. No superimposed acute findings are identified.  PFT: PFT Results Latest Ref Rng & Units 10/13/2017 01/30/2017 01/15/2016 05/27/2015 04/29/2014  FVC-Pre L 1.62 1.89 2.13 1.94 1.93  FVC-Predicted Pre % 83 95 106 95 72  FVC-Post L 1.67 1.88 1.81 1.81 1.79  FVC-Predicted Post % 86 95 90 88 67  Pre FEV1/FVC % % 81 79 78 83 80  Post FEV1/FCV % % 81 85 78 87 86    FEV1-Pre L 1.32 1.50 1.65 1.61 1.55  FEV1-Predicted Pre % 87 97 105 101 78  FEV1-Post L 1.36 1.61 1.41 1.58 1.54  DLCO UNC% % 24 27 32 34 35  DLCO COR %Predicted % 46 50 47 60 65  TLC L 3.64 3.37 3.79 3.38 3.27  TLC % Predicted % 70 64 72 64 62  RV % Predicted % 66 61 70 54 56   OV 09/06/18 - FOLLOW UP Patient presents today for routine follow-up.  She was started on CellCept for rheumatoid arthritis associated interstitial lung disease in October along with dapsone.  This is been a stable interval for patient and she was last seen by me on 07/16/2018.  She has been taking her medications as directed since last visit.  Has been doing well on CellCept.  She has not had any adverse reactions.  She denies any diarrhea or upset stomach.  She denies any recent fever.  She is on 2 L O2 nasal cannula continuous.  She had labs checked yesterday.  Discussed that her potassium was low and she needs to increase potassium in her diet.  Handout was given to patient today.  Patient's hemoglobin was low as well.  She states that she has seen blood in her stool recently.  She denies any chest pain, shortness of breath, or edema.    Allergies  Allergen Reactions  . Crestor [Rosuvastatin] Other (See Comments)    Muscle pain  . Lactose Intolerance (Gi) Other (See Comments)    Stomach upset  .  Penicillins Hives  . Imuran [Azathioprine] Nausea And Vomiting  . Niacin Hives    Immunization History  Administered Date(s) Administered  . Influenza Split 05/09/2011  . Influenza Whole 04/10/2010, 04/10/2012  . Influenza, High Dose Seasonal PF 05/07/2017, 04/23/2018  . Influenza,inj,Quad PF,6+ Mos 07/25/2013, 04/29/2014, 04/16/2015  . Influenza-Unspecified 04/24/2016  . Pneumococcal Conjugate-13 04/16/2015  . Pneumococcal Polysaccharide-23 07/26/2017  . Tdap 11/12/2013  . Zoster 07/11/2013    Past Medical History:  Diagnosis Date  . Allergic rhinitis   . Allergy   . CAD (coronary artery disease)    Mild  CAD by cath 2008  . DJD (degenerative joint disease)    rheumatoid  . DVT (deep venous thrombosis) ([place])   . Dyslipidemia   . History of nuclear stress test    Myoview 5/17:  EF 74%, normal perfusion, low risk  . History of pulmonary embolism   . Hyperlipidemia   . Insomnia   . Osteoporosis   . Psoriasis   . Pulmonary fibrosis ([place])   . Rheumatoid arthritis ([place])     Tobacco History: Social History   Tobacco Use  Smoking Status Former Smoker  . Packs/day: 0.30  . Years: 20.00  . Pack years: 6.00  . Types: Cigarettes  . Last attempt to quit: 07/11/1973  . Years since quitting: 45.1  Smokeless Tobacco Never Used   Counseling given: Yes   Outpatient Encounter Medications as of 09/06/2018  Medication Sig  . acetaminophen (TYLENOL) 500 MG tablet Take 2 tablets (1,000 mg total) by mouth every 6 (six) hours as needed.  . ALPRAZolam (XANAX) 0.25 MG tablet Take 1 tablet (0.25 mg total) by mouth at bedtime as needed for anxiety.  [name] aspirin EC 81 MG tablet Take 1 tablet (81 mg total) by mouth daily.  [name] atorvastatin (LIPITOR) 10 MG tablet Take 1 tablet (10 mg total) by mouth daily.  . dapsone 100 MG tablet Take 1 tablet (100 mg total) by mouth daily.  [name] enoxaparin (LOVENOX) 30 MG/0.3ML injection Inject 0.3 mLs (30 mg total) into the skin every 12 (twelve) hours as needed (travel only).  . fluticasone (FLONASE) 50 MCG/ACT nasal spray Place 2 sprays into both nostrils daily as needed for allergies or rhinitis.  . furosemide (LASIX) 20 MG tablet Take 1 tablet by mouth daily as needed for swelling.  . InFLIXimab (REMICADE IV) Inject into the vein.  [name] latanoprost (XALATAN) 0.005 % ophthalmic solution Place 1 drop into both eyes at bedtime.   . levalbuterol (XOPENEX HFA) 45 MCG/ACT inhaler Inhale 2 puffs into the lungs every 6 (six) hours as needed for wheezing.  . mycophenolate (CELLCEPT) 500 MG tablet 2 tabs bid  . Polyethyl Glycol-Propyl Glycol (SYSTANE) 0.4-0.3 % SOLN Apply 1 drop to eye  daily as needed (for dry eyes).  . predniSONE (DELTASONE) 5 MG tablet Take 5 mg by mouth daily with breakfast.  . triamcinolone cream (KENALOG) 0.1 % Apply 1 application topically 2 (two) times daily.  . [DISCONTINUED] HYDROcodone-acetaminophen (NORCO/VICODIN) 5-325 MG tablet Take 2 tablets by mouth every 6 (six) hours as needed for severe pain.   No facility-administered encounter medications on file as of 09/06/2018.      Review of Systems  Review of Systems  Constitutional: Negative.  Negative for chills and fever.  HENT: Negative.   Respiratory: Negative for cough, shortness of breath and wheezing.   Cardiovascular: Negative.   Gastrointestinal: Negative.  Negative for constipation, diarrhea and nausea.  Allergic/Immunologic: Negative.   Neurological: Negative.  Psychiatric/Behavioral: Negative.        Physical Exam  BP (!) 104/58 (BP Location: Left Arm, Cuff Size: Normal)   Pulse 66   SpO2 95%   Wt Readings from Last 5 Encounters:  07/31/18 128 lb (58.1 kg)  07/16/18 128 lb 6.4 oz (58.2 kg)  07/10/18 128 lb 3.2 oz (58.2 kg)  06/18/18 128 lb (58.1 kg)  05/23/18 132 lb 3.2 oz (60 kg)     Physical Exam Vitals signs and nursing note reviewed.  Constitutional:      General: She is not in acute distress.    Appearance: She is well-developed.  Cardiovascular:     Rate and Rhythm: Normal rate and regular rhythm.  Pulmonary:     Effort: Pulmonary effort is normal. No respiratory distress.     Breath sounds: Normal breath sounds. No wheezing or rhonchi.  Musculoskeletal:        General: No swelling.  Neurological:     Mental Status: She is alert and oriented to person, place, and time.       Assessment & Plan:   Postinflammatory pulmonary fibrosis ([place]) Patient presents today for routine follow-up.  She was started on CellCept for rheumatoid arthritis associated interstitial lung disease in October along with dapsone.  This is been a stable interval for patient  - she was last seen by me on 07/16/2018.  She has been taking her medications as directed since last visit.  Has been doing well on CellCept. She had labs checked yesterday.  Discussed that her potassium was low and she needs to increase potassium in her diet.  Handout was given to patient today.  Patient's hemoglobin was low as well.  She states that she has seen blood in her stool recently.  Advised patient to call PCP today to have stool checked. She agreed.   Patient Instructions  Continue Cellcept Continue dapsone Will continue to monitor labs closely Will recheck labs in 2 weeks and call with results Potassium was low - please eat diet high in potassium HGB was lower - please contact PCP  - patient noted recent blood in stool Follow up with Dr. Lake Bells at his first available appointment    Hypokalemia Hypokalemia means that the amount of potassium in the blood is lower than normal.Potassium is a chemical that helps regulate the amount of fluid in the body (electrolyte). It also stimulates muscle tightening (contraction) and helps nerves work properly.Normally, most of the body's potassium is inside of cells, and only a very small amount is in the blood. Because the amount in the blood is so small, minor changes to potassium levels in the blood can be life-threatening. What are the causes? This condition may be caused by:  Antibiotic medicine.  Diarrhea or vomiting. Taking too much of a medicine that helps you have a bowel movement (laxative) can cause diarrhea and lead to hypokalemia.  Chronic kidney disease (CKD).  Medicines that help the body get rid of excess fluid (diuretics).  Eating disorders, such as bulimia.  Low magnesium levels in the body.  Sweating a lot. What are the signs or symptoms? Symptoms of this condition include:  Weakness.  Constipation.  Fatigue.  Muscle cramps.  Mental confusion.  Skipped heartbeats or irregular heartbeat  (palpitations).  Tingling or numbness. How is this diagnosed? This condition is diagnosed with a blood test. How is this treated? Hypokalemia can be treated by taking potassium supplements by mouth or adjusting the medicines that you take. Treatment may also  include eating more foods that contain a lot of potassium. If your potassium level is very low, you may need to get potassium through an IV tube in one of your veins and be monitored in the hospital. Follow these instructions at home:   Take over-the-counter and prescription medicines only as told by your health care provider. This includes vitamins and supplements.  Eat a healthy diet. A healthy diet includes fresh fruits and vegetables, whole grains, healthy fats, and lean proteins.  If instructed, eat more foods that contain a lot of potassium, such as: ? Nuts, such as peanuts and pistachios. ? Seeds, such as sunflower seeds and pumpkin seeds. ? Peas, lentils, and lima beans. ? Whole grain and bran cereals and breads. ? Fresh fruits and vegetables, such as apricots, avocado, bananas, cantaloupe, kiwi, oranges, tomatoes, asparagus, and potatoes. ? Orange juice. ? Tomato juice. ? Red meats. ? Yogurt.  Keep all follow-up visits as told by your health care provider. This is important. Contact a health care provider if:  You have weakness that gets worse.  You feel your heart pounding or racing.  You vomit.  You have diarrhea.  You have diabetes (diabetes mellitus) and you have trouble keeping your blood sugar (glucose) in your target range. Get help right away if:  You have chest pain.  You have shortness of breath.  You have vomiting or diarrhea that lasts for more than 2 days.  You faint. This information is not intended to replace advice given to you by your health care provider. Make sure you discuss any questions you have with your health care provider. Document Released: 06/27/2005 Document Revised: 02/13/2016  Document Reviewed: 02/13/2016 Elsevier Interactive Patient Education  2019 [place], [place] 09/06/2018

## 2018-09-06 NOTE — Patient Instructions (Signed)
Continue Cellcept Continue dapsone Will continue to monitor labs closely Will recheck labs in 2 weeks and call with results Potassium was low - please eat diet high in potassium HGB was lower - please contact PCP  - patient noted recent blood in stool Follow up with Dr. Lake Bells at his first available appointment    Hypokalemia Hypokalemia means that the amount of potassium in the blood is lower than normal.Potassium is a chemical that helps regulate the amount of fluid in the body (electrolyte). It also stimulates muscle tightening (contraction) and helps nerves work properly.Normally, most of the body's potassium is inside of cells, and only a very small amount is in the blood. Because the amount in the blood is so small, minor changes to potassium levels in the blood can be life-threatening. What are the causes? This condition may be caused by:  Antibiotic medicine.  Diarrhea or vomiting. Taking too much of a medicine that helps you have a bowel movement (laxative) can cause diarrhea and lead to hypokalemia.  Chronic kidney disease (CKD).  Medicines that help the body get rid of excess fluid (diuretics).  Eating disorders, such as bulimia.  Low magnesium levels in the body.  Sweating a lot. What are the signs or symptoms? Symptoms of this condition include:  Weakness.  Constipation.  Fatigue.  Muscle cramps.  Mental confusion.  Skipped heartbeats or irregular heartbeat (palpitations).  Tingling or numbness. How is this diagnosed? This condition is diagnosed with a blood test. How is this treated? Hypokalemia can be treated by taking potassium supplements by mouth or adjusting the medicines that you take. Treatment may also include eating more foods that contain a lot of potassium. If your potassium level is very low, you may need to get potassium through an IV tube in one of your veins and be monitored in the hospital. Follow these instructions at home:   Take  over-the-counter and prescription medicines only as told by your health care provider. This includes vitamins and supplements.  Eat a healthy diet. A healthy diet includes fresh fruits and vegetables, whole grains, healthy fats, and lean proteins.  If instructed, eat more foods that contain a lot of potassium, such as: ? Nuts, such as peanuts and pistachios. ? Seeds, such as sunflower seeds and pumpkin seeds. ? Peas, lentils, and lima beans. ? Whole grain and bran cereals and breads. ? Fresh fruits and vegetables, such as apricots, avocado, bananas, cantaloupe, kiwi, oranges, tomatoes, asparagus, and potatoes. ? Orange juice. ? Tomato juice. ? Red meats. ? Yogurt.  Keep all follow-up visits as told by your health care provider. This is important. Contact a health care provider if:  You have weakness that gets worse.  You feel your heart pounding or racing.  You vomit.  You have diarrhea.  You have diabetes (diabetes mellitus) and you have trouble keeping your blood sugar (glucose) in your target range. Get help right away if:  You have chest pain.  You have shortness of breath.  You have vomiting or diarrhea that lasts for more than 2 days.  You faint. This information is not intended to replace advice given to you by your health care provider. Make sure you discuss any questions you have with your health care provider. Document Released: 06/27/2005 Document Revised: 02/13/2016 Document Reviewed: 02/13/2016 Elsevier Interactive Patient Education  2019 [employer].

## 2018-09-13 ENCOUNTER — Encounter: Payer: Self-pay | Admitting: Internal Medicine

## 2018-09-13 ENCOUNTER — Other Ambulatory Visit (INDEPENDENT_AMBULATORY_CARE_PROVIDER_SITE_OTHER): Payer: Medicare Other

## 2018-09-13 ENCOUNTER — Ambulatory Visit (INDEPENDENT_AMBULATORY_CARE_PROVIDER_SITE_OTHER): Payer: Medicare Other | Admitting: Internal Medicine

## 2018-09-13 VITALS — BP 110/60 | HR 88 | Temp 97.9°F | Ht 64.0 in | Wt 125.0 lb

## 2018-09-13 DIAGNOSIS — K921 Melena: Secondary | ICD-10-CM

## 2018-09-13 DIAGNOSIS — K5904 Chronic idiopathic constipation: Secondary | ICD-10-CM

## 2018-09-13 DIAGNOSIS — J8410 Pulmonary fibrosis, unspecified: Secondary | ICD-10-CM

## 2018-09-13 LAB — CBC WITH DIFFERENTIAL/PLATELET
Basophils Absolute: 0.1 10*3/uL (ref 0.0–0.1)
Basophils Relative: 1.1 % (ref 0.0–3.0)
EOS PCT: 1.9 % (ref 0.0–5.0)
Eosinophils Absolute: 0.2 10*3/uL (ref 0.0–0.7)
HCT: 32.1 % — ABNORMAL LOW (ref 36.0–46.0)
HEMOGLOBIN: 10.5 g/dL — AB (ref 12.0–15.0)
Lymphocytes Relative: 32.3 % (ref 12.0–46.0)
Lymphs Abs: 3.2 10*3/uL (ref 0.7–4.0)
MCHC: 32.5 g/dL (ref 30.0–36.0)
MCV: 96.5 fl (ref 78.0–100.0)
Monocytes Absolute: 1.1 10*3/uL — ABNORMAL HIGH (ref 0.1–1.0)
Monocytes Relative: 11.6 % (ref 3.0–12.0)
Neutro Abs: 5.2 10*3/uL (ref 1.4–7.7)
Neutrophils Relative %: 53.1 % (ref 43.0–77.0)
Platelets: 197 10*3/uL (ref 150.0–400.0)
RBC: 3.33 Mil/uL — ABNORMAL LOW (ref 3.87–5.11)
RDW: 16.2 % — ABNORMAL HIGH (ref 11.5–15.5)
WBC: 9.8 10*3/uL (ref 4.0–10.5)

## 2018-09-13 MED ORDER — HYDROCORTISONE 2.5 % RE CREA: 1.0000 "application " | TOPICAL_CREAM | Freq: Two times a day (BID) | RECTAL | 0 refills | Status: DC

## 2018-09-13 MED ORDER — POLYETHYLENE GLYCOL 3350 17 GM/SCOOP PO POWD: 17.0000 g | Freq: Two times a day (BID) | ORAL | 1 refills | Status: DC | PRN

## 2018-09-13 NOTE — Patient Instructions (Signed)
We are checking the blood work today.  We have sent in miralax to take 1 capful daily for the constipation.  We have sent in anusol to use rectally for the hemorrhoids twice a day.

## 2018-09-13 NOTE — Progress Notes (Signed)
   Subjective:   Patient ID: [name] [name], female    DOB: 07-06-1931, 85 y.o.   MRN: [id number]  HPI The patient is a 85 y.o. female coming in for red in the toilet. Started several weeks ago but not more than months ago. She cannot recall when. Main symptoms are: blood in toilet after BM. She initially thought it was in the urine and called and saw her urologist. They did not find any problems. Denies pain in stomach, diarrhea. Has chronic constipation which is worse lately and she is not taking anything for it. Overall it is stable. Has tried nothing. She does have history of hemorrhoids last colonoscopy was 2009 with diverticulosis and moderate internal hemorrhoids. Labs with other provider showed 3 point loss Hg recently. She does take low dose aspirin daily. Having some more SOB recently and visit with pulmonary shows no changes from their perspective.   Review of Systems  Constitutional: Negative.   HENT: Negative.   Eyes: Negative.   Respiratory: Positive for shortness of breath. Negative for cough and chest tightness.   Cardiovascular: Negative for chest pain, palpitations and leg swelling.  Gastrointestinal: Positive for blood in stool and constipation. Negative for abdominal distention, abdominal pain, diarrhea, nausea and vomiting.  Musculoskeletal: Negative.   Skin: Negative.   Neurological: Negative.   Psychiatric/Behavioral: Negative.     Objective:  Physical Exam Constitutional:      Appearance: She is well-developed.  HENT:     Head: Normocephalic and atraumatic.  Neck:     Musculoskeletal: Normal range of motion.  Cardiovascular:     Rate and Rhythm: Normal rate and regular rhythm.  Pulmonary:     Effort: Pulmonary effort is normal. No respiratory distress.     Breath sounds: Normal breath sounds. No wheezing or rales.     Comments: On oxygen, lung exam stable from prior Abdominal:     General: Bowel sounds are normal. There is no distension.     Palpations:  Abdomen is soft.     Tenderness: There is no abdominal tenderness. There is no rebound.  Skin:    General: Skin is warm and dry.  Neurological:     Mental Status: She is alert and oriented to person, place, and time.     Coordination: Coordination normal.     Vitals:   09/13/18 1313  BP: 110/60  Pulse: 88  Temp: 97.9 F (36.6 C)  TempSrc: Oral  SpO2: 95%  Weight: 125 lb (56.7 kg)  Height: 5\' 4"  (1.626 m)    Assessment & Plan:

## 2018-09-14 DIAGNOSIS — K921 Melena: Secondary | ICD-10-CM | POA: Insufficient documentation

## 2018-09-14 NOTE — Assessment & Plan Note (Signed)
Concern for anemia causing more SOB with exertion as her recent pulmonary visit without changes. She is advised she can increase O2 as needed.

## 2018-09-14 NOTE — Assessment & Plan Note (Signed)
Could be related to hemorrhoids or diverticulosis. Rx for anusol to help with hemorrhoids. Checking CBC today and if continued loss needs urgent GI referral or consideration or ER depending on levels.

## 2018-09-14 NOTE — Assessment & Plan Note (Signed)
Chronic and she has not tried even otc products. Rx for miralax and talked about initial dosing and titration based on results.

## 2018-09-20 ENCOUNTER — Other Ambulatory Visit: Payer: Medicare Other

## 2018-09-20 DIAGNOSIS — Z5181 Encounter for therapeutic drug level monitoring: Secondary | ICD-10-CM

## 2018-09-20 LAB — CBC WITH DIFFERENTIAL/PLATELET
Basophils Absolute: 0 10*3/uL (ref 0.0–0.1)
Basophils Relative: 0.6 % (ref 0.0–3.0)
Eosinophils Absolute: 0.1 10*3/uL (ref 0.0–0.7)
Eosinophils Relative: 1.3 % (ref 0.0–5.0)
HCT: 31.7 % — ABNORMAL LOW (ref 36.0–46.0)
HEMOGLOBIN: 10.3 g/dL — AB (ref 12.0–15.0)
Lymphocytes Relative: 41.8 % (ref 12.0–46.0)
Lymphs Abs: 3.6 10*3/uL (ref 0.7–4.0)
MCHC: 32.5 g/dL (ref 30.0–36.0)
MCV: 96.9 fl (ref 78.0–100.0)
MONOS PCT: 12.5 % — AB (ref 3.0–12.0)
Monocytes Absolute: 1.1 10*3/uL — ABNORMAL HIGH (ref 0.1–1.0)
Neutro Abs: 3.8 10*3/uL (ref 1.4–7.7)
Neutrophils Relative %: 43.8 % (ref 43.0–77.0)
Platelets: 237 10*3/uL (ref 150.0–400.0)
RBC: 3.27 Mil/uL — ABNORMAL LOW (ref 3.87–5.11)
RDW: 15.4 % (ref 11.5–15.5)
WBC: 8.7 10*3/uL (ref 4.0–10.5)

## 2018-09-20 LAB — COMPREHENSIVE METABOLIC PANEL
ALBUMIN: 3.7 g/dL (ref 3.5–5.2)
ALK PHOS: 70 U/L (ref 39–117)
ALT: 10 U/L (ref 0–35)
AST: 17 U/L (ref 0–37)
BUN: 10 mg/dL (ref 6–23)
CALCIUM: 9.3 mg/dL (ref 8.4–10.5)
CO2: 28 mEq/L (ref 19–32)
Chloride: 103 mEq/L (ref 96–112)
Creatinine, Ser: 0.74 mg/dL (ref 0.40–1.20)
GFR: 90.05 mL/min (ref >60.00)
Glucose, Bld: 86 mg/dL (ref 70–99)
Potassium: 3.9 mEq/L (ref 3.5–5.1)
Sodium: 138 mEq/L (ref 135–145)
Total Bilirubin: 0.8 mg/dL (ref 0.2–1.2)
Total Protein: 7.1 g/dL (ref 6.0–8.3)

## 2018-10-08 ENCOUNTER — Telehealth: Payer: Self-pay | Admitting: *Deleted

## 2018-10-08 NOTE — Telephone Encounter (Signed)
Left message for patient to call back  

## 2018-10-09 NOTE — Telephone Encounter (Signed)
Spoke with pt in regards to appt scheduled with Dr. Angelena Form scheduled for 10/10/2018.  Pt denies any cardiac issues at this time.  Advised pt we would reach out to her to get her rescheduled once appropriate.  Pt verbalized understanding and was appreciative for call.

## 2018-10-09 NOTE — Telephone Encounter (Signed)
New Message ° ° °Patient returning [name] [name]'s call. °

## 2018-10-10 ENCOUNTER — Ambulatory Visit: Payer: Medicare Other | Admitting: Cardiovascular Disease

## 2018-10-17 ENCOUNTER — Other Ambulatory Visit: Payer: Self-pay | Admitting: Cardiovascular Disease

## 2018-10-17 DIAGNOSIS — E7849 Other hyperlipidemia: Secondary | ICD-10-CM

## 2018-10-17 MED ORDER — ATORVASTATIN CALCIUM 10 MG PO TABS: 10.0000 mg | ORAL_TABLET | Freq: Every day | ORAL | 1 refills | Status: DC

## 2018-10-18 DIAGNOSIS — L409 Psoriasis, unspecified: Secondary | ICD-10-CM | POA: Diagnosis not present

## 2018-10-18 DIAGNOSIS — L404 Guttate psoriasis: Secondary | ICD-10-CM | POA: Diagnosis not present

## 2018-10-23 ENCOUNTER — Ambulatory Visit (INDEPENDENT_AMBULATORY_CARE_PROVIDER_SITE_OTHER): Payer: Medicare Other | Admitting: Internal Medicine

## 2018-10-23 ENCOUNTER — Encounter: Payer: Self-pay | Admitting: Internal Medicine

## 2018-10-23 ENCOUNTER — Telehealth: Payer: Self-pay

## 2018-10-23 DIAGNOSIS — B001 Herpesviral vesicular dermatitis: Secondary | ICD-10-CM | POA: Diagnosis not present

## 2018-10-23 MED ORDER — ACYCLOVIR 5 % EX OINT: 1.0000 "application " | TOPICAL_OINTMENT | CUTANEOUS | 3 refills | Status: DC

## 2018-10-23 NOTE — Telephone Encounter (Signed)
Copied from [place] [id number]. Topic: General - Inquiry >> Oct 23, 2018 11:22 AM Richardo Priest, NT wrote: Reason for CRM: Patient called in she has had cold sores on the sides of her mouth for over a year. States they have recently gotten more painful and would like a medication to deal with them. Call back number is [phone].

## 2018-10-23 NOTE — Telephone Encounter (Signed)
Is patient able to do a virtual visit?

## 2018-10-23 NOTE — Progress Notes (Signed)
Virtual Visit via Video Note  I connected with [name] [name] on 10/23/18 at  2:20 PM EDT by a video enabled telemedicine application and verified that I am speaking with the correct person using two identifiers.   I discussed the limitations of evaluation and management by telemedicine and the availability of in person appointments. The patient expressed understanding and agreed to proceed.  History of Present Illness: The patient is a 85 y.o. female with visit for sores on the side of her mouth. She thinks that it is a cold sore. Denies exposure to new medicines or lotion or soap or makeup. Has had them off and on for about 1 year. She has used several different things on them including abreva, and some other prescription creams she had for other things. The abreva worked the best. These sores hurt quite a lot and last for several weeks and have some skin breakdown. Started about 1 year ago. Denies fevers or chills or SOB or cough. Overall it is worsening now.  Observations/Objective: Appearance: normal, breathing appears normal and wearing oxygen, casual grooming, abdomen does not appear distended, throat normal, lips with a lesion on the sides, mental status is A and O times 3  Assessment and Plan: See problem oriented charting  Follow Up Instructions: rx for acyclovir ointment and if no resolution can rx valtrex  I discussed the assessment and treatment plan with the patient. The patient was provided an opportunity to ask questions and all were answered. The patient agreed with the plan and demonstrated an understanding of the instructions.   The patient was advised to call back or seek an in-person evaluation if the symptoms worsen or if the condition fails to improve as anticipated.  Hoyt Koch, MD

## 2018-10-23 NOTE — Telephone Encounter (Signed)
Scheduled

## 2018-10-23 NOTE — Assessment & Plan Note (Signed)
Rx for acyclovir ointment and if no response we talked about valtrex oral to try next.

## 2018-10-23 NOTE — Telephone Encounter (Signed)
noted 

## 2018-10-24 ENCOUNTER — Telehealth: Payer: Self-pay | Admitting: Internal Medicine

## 2018-10-24 NOTE — Telephone Encounter (Signed)
Patient would like a call back from the office to discuss her new medication, she states that she is confused and does not understand it [phone] (mobile)

## 2018-10-25 ENCOUNTER — Ambulatory Visit: Payer: Medicare Other | Admitting: Pulmonary Disease

## 2018-10-25 NOTE — Telephone Encounter (Signed)
Tried calling patient to discuss medication but mailbox was full and could not leave a voicemail. I am assuming she is confused about the acyclovir ointment that was prescribed on the 14th, but I am not sure since there was no name of medication given in the previous note. Patient is to apply the ointment every three hours to her sores on the side of her mouth.

## 2018-10-25 NOTE — Telephone Encounter (Signed)
Patient called and asked about the medication she has questions about and she says acyclovir. I advised of the message below from [place], [place]. She verbalized understanding and says she had already called someone else and thanked me for the information.

## 2018-11-06 ENCOUNTER — Ambulatory Visit: Payer: Medicare Other | Admitting: Pulmonary Disease

## 2018-11-19 DIAGNOSIS — M0579 Rheumatoid arthritis with rheumatoid factor of multiple sites without organ or systems involvement: Secondary | ICD-10-CM | POA: Diagnosis not present

## 2018-12-06 ENCOUNTER — Telehealth: Payer: Self-pay

## 2018-12-06 MED ORDER — VALACYCLOVIR HCL 1 G PO TABS: 1000.0000 mg | ORAL_TABLET | Freq: Two times a day (BID) | ORAL | 0 refills | Status: DC

## 2018-12-06 NOTE — Telephone Encounter (Signed)
Copied from [place] [id number]. Topic: General - Inquiry >> Dec 06, 2018  1:39 PM [name] [name] wrote: Reason for CRM: patient is having a herpes flare up and would like PCP to prescribe her something.  Patient states PCP knew what to prescribe her.  Call back # [phone] >> Dec 06, 2018  2:29 PM Para Skeans A wrote: Do they need an appointment?

## 2018-12-06 NOTE — Telephone Encounter (Signed)
She has refill of the ointment, did this help her before? Otherwise we can send in valtrex which is a pill instead.

## 2018-12-06 NOTE — Telephone Encounter (Signed)
Patient informed you would send in valtrex for her

## 2018-12-06 NOTE — Telephone Encounter (Signed)
Sent in

## 2018-12-06 NOTE — Addendum Note (Signed)
Addended by: Pricilla Holm A on: 12/06/2018 03:15 PM   Modules accepted: Orders

## 2018-12-06 NOTE — Telephone Encounter (Signed)
Tried calling patient phone kept ringing. Will call back before I leave for the day also routing to [place] patient calls back before I do

## 2018-12-10 ENCOUNTER — Telehealth: Payer: Self-pay | Admitting: Cardiovascular Disease

## 2018-12-10 NOTE — Progress Notes (Signed)
Telehealth Visit     Virtual Visit via Video Note   This visit type was conducted due to national recommendations for restrictions regarding the COVID-19 Pandemic (e.g. social distancing) in an effort to limit this patient's exposure and mitigate transmission in our community.  Due to her co-morbid illnesses, this patient is at least at moderate risk for complications without adequate follow up.  This format is felt to be most appropriate for this patient at this time.  All issues noted in this document were discussed and addressed.  A limited physical exam was performed with this format.  Please refer to the patient's chart for her consent to telehealth for [facility].   Evaluation Performed:  Follow-up visit  This visit type was conducted due to national recommendations for restrictions regarding the COVID-19 Pandemic (e.g. social distancing).  This format is felt to be most appropriate for this patient at this time.  All issues noted in this document were discussed and addressed.  No physical exam was performed (except for noted visual exam findings with Video Visits).  Please refer to the patient's chart (MyChart message for video visits and phone note for telephone visits) for the patient's consent to telehealth for [facility].  Date:  12/11/2018   ID:  [name] [name], DOB 19-Jun-1931, MRN [id number]  Patient Location:  Home  Provider location:   Home  PCP:  Hoyt Koch, MD  Cardiologist:  Lauree Chandler, MD  Electrophysiologist:  None   Chief Complaint:  Chest pain  History of Present Illness:    [name] [name] is a 85 y.o. female who presents via audio/video conferencing for a telehealth visit today.  Seen for Dr. Angelena Form.   She has a history of CAD, cath in 2008 with mild CAD, repeat cath 06/2013 30% mid LAD, 30% mid circumflex, 20% mid RCA.  Nuclear stress test 11/2015 without ischemia, chest CTA without acute PE.  Echo 01/2017 normal LVEF 65% with grade 1 DD  no valvular disease.  She has had a tendency towards chronic chest pain - last  Nuclear stress test 06/08/2017 no ischemia. Other issues include a history of DVT/PE, HLD, & pulmonary fibrosis on home oxygen.  Last seen by Dr. Angelena Form 06/2017 at which time she had mild lower extremity edema.  She was given Lasix daily for 5 days then to take as needed.  Has been followed closely by pulmonary with worsening shortness of breath and medication adjustment.  Seemed to have improved with Imuran. BNP to rule out CHF was 55 in April of 2019.   Last seen by Ermalinda Barrios, PA in June of 2019 - on oxygen full time. Using lasix just prn. Follow up with Dr. Angelena Form moved out due to [place].   Phone call yesterday - "I spoke with  Pt. She reports she has been having swelling in her ankles for awhile.  Took lasix a couple times and it helped but lasix is old and she didn't know if she should take. Also had chest pain a month or so ago.  Today she had shoulder and arm pain in the morning.  She does not know if this is heart related or possibly arthritis.  I told pt it would be OK to take lasix for now and to try and keep feet elevated. I scheduled her for a video visit with Marcellina Millin, NP tomorrow at 2:30. She does not have scales at home but does have BP cuff and pulse oximeter.  Consent obtained today."  Thus added to my schedule for today.   The patient does not have symptoms concerning for COVID-19 infection (fever, chills, cough, or new shortness of breath).   Seen today via [place]. She has consented for this visit. NOted some mild anemia over the past 4 months. She has multiple concerns. She needs her Lasix refilled - done. She has had more swelling - she took the Lasix yesterday as recommended - this helped with her swelling. She does not really get much sodium intake. Weight is down. She also endorses midsternal chest pain over the past several weeks - worse with activity - gets better with rest. Not having  every day - but will have spells most days. Also worse with lifting - she has been doing more housework due to the [place] - not letting anyone in her house. HR is a little elevated - this is unusual for her. BP soft but normal for her.  She has had weight loss. Unclear etiology. She admits she does not have a good appetite. She has chronic dyspnea - she is on chronic oxygen.   Past Medical History:  Diagnosis Date  . Allergic rhinitis   . Allergy   . CAD (coronary artery disease)    Mild CAD by cath 2008  . DJD (degenerative joint disease)    rheumatoid  . DVT (deep venous thrombosis) ([place])   . Dyslipidemia   . History of nuclear stress test    Myoview 5/17:  EF 74%, normal perfusion, low risk  . History of pulmonary embolism   . Hyperlipidemia   . Insomnia   . Osteoporosis   . Psoriasis   . Pulmonary fibrosis ([place])   . Rheumatoid arthritis [facility])    Past Surgical History:  Procedure Laterality Date  . ABDOMINAL HYSTERECTOMY  1974  . APPENDECTOMY  1959  . CATARACT EXTRACTION    . LEFT HEART CATHETERIZATION WITH CORONARY ANGIOGRAM N/A 06/18/2013   Procedure: LEFT HEART CATHETERIZATION WITH CORONARY ANGIOGRAM;  Surgeon: Peter M Martinique, MD;  Location: [facility] CATH LAB;  Service: Cardiovascular;  Laterality: N/A;  . TONSILLECTOMY  1941, 1951  . TOTAL KNEE ARTHROPLASTY  1997, 2007   Bilateral     Current Meds  Medication Sig  . ALPRAZolam (XANAX) 0.25 MG tablet Take 1 tablet (0.25 mg total) by mouth at bedtime as needed for anxiety.  [name] aspirin EC 81 MG tablet Take 1 tablet (81 mg total) by mouth daily.  [name] atorvastatin (LIPITOR) 10 MG tablet Take 1 tablet (10 mg total) by mouth daily. Please make yearly appt with Dr. Angelena Form for June for future refills. 1st attempt  . dapsone 100 MG tablet Take 1 tablet (100 mg total) by mouth daily.  . fluticasone (FLONASE) 50 MCG/ACT nasal spray Place 2 sprays into both nostrils daily as needed for allergies or rhinitis.  . furosemide (LASIX) 20 MG  tablet Take 1 tablet by mouth daily as needed for swelling.  . hydrocortisone (ANUSOL-HC) 2.5 % rectal cream Place 1 application rectally 2 (two) times daily.  . InFLIXimab (REMICADE IV) Inject into the vein.  [name] latanoprost (XALATAN) 0.005 % ophthalmic solution Place 1 drop into both eyes at bedtime.   . levalbuterol (XOPENEX HFA) 45 MCG/ACT inhaler Inhale 2 puffs into the lungs every 6 (six) hours as needed for wheezing.  . mycophenolate (CELLCEPT) 500 MG tablet 2 tabs bid  . Polyethyl Glycol-Propyl Glycol (SYSTANE) 0.4-0.3 % SOLN Apply 1 drop to eye daily as needed (for dry eyes).  . polyethylene  glycol powder (GLYCOLAX/MIRALAX) powder Take 17 g by mouth 2 (two) times daily as needed.  . predniSONE (DELTASONE) 5 MG tablet Take 5 mg by mouth daily with breakfast.  . triamcinolone cream (KENALOG) 0.1 % Apply 1 application topically 2 (two) times daily.  . valACYclovir (VALTREX) 1000 MG tablet Take 1 tablet (1,000 mg total) by mouth 2 (two) times daily.  . [DISCONTINUED] acetaminophen (TYLENOL) 500 MG tablet Take 2 tablets (1,000 mg total) by mouth every 6 (six) hours as needed. (Patient taking differently: Take 1,000 mg by mouth every 6 (six) hours as needed for moderate pain. )  . [DISCONTINUED] enoxaparin (LOVENOX) 30 MG/0.3ML injection Inject 0.3 mLs (30 mg total) into the skin every 12 (twelve) hours as needed (travel only).  . [DISCONTINUED] furosemide (LASIX) 20 MG tablet Take 1 tablet by mouth daily as needed for swelling.     Allergies:   Crestor [rosuvastatin]; Lactose intolerance (gi); Penicillins; Imuran [azathioprine]; and Niacin   Social History   Tobacco Use  . Smoking status: Former Smoker    Packs/day: 0.30    Years: 20.00    Pack years: 6.00    Types: Cigarettes    Last attempt to quit: 07/11/1973    Years since quitting: 45.4  . Smokeless tobacco: Never Used  Substance Use Topics  . Alcohol use: No  . Drug use: No     Family Hx: The patient's family history includes  Alzheimer's disease in her sister and sister; Cancer in her daughter; Heart attack (age of onset: 42) in her father; Heart disease in her sister; Kidney disease (age of onset: 55) in her daughter.  ROS:   Please see the history of present illness.   All other systems reviewed are negative.    Objective:    Vital Signs:  BP 104/65   Pulse (!) 101   Ht 5' 4.5" (1.638 m)   Wt 120 lb (54.4 kg)   SpO2 99%   BMI 20.28 kg/m    Wt Readings from Last 3 Encounters:  12/11/18 120 lb (54.4 kg)  09/13/18 125 lb (56.7 kg)  07/31/18 128 lb (58.1 kg)    Alert female in no acute distress. Not short of breath with conversation. She has oxygen in place. She is quite delightful.    Labs/Other Tests and Data Reviewed:    Lab Results  Component Value Date   WBC 8.7 09/20/2018   HGB 10.3 (L) 09/20/2018   HCT 31.7 (L) 09/20/2018   PLT 237.0 09/20/2018   GLUCOSE 86 09/20/2018   CHOL 177 12/23/2016   TRIG 96 12/23/2016   HDL 70 12/23/2016   LDLCALC 88 12/23/2016   ALT 10 09/20/2018   AST 17 09/20/2018   NA 138 09/20/2018   K 3.9 09/20/2018   CL 103 09/20/2018   CREATININE 0.74 09/20/2018   BUN 10 09/20/2018   CO2 28 09/20/2018   TSH 1.83 10/24/2016   INR 2.2 01/28/2014     BNP (last 3 results) No results for input(s): BNP in the last 8760 hours.  ProBNP (last 3 results) No results for input(s): PROBNP in the last 8760 hours.    Prior CV studies:    The following studies were reviewed today:   Nuclear stress test 05/2017   Nuclear stress EF: 97%.  The study is normal. No ischemia  This is a low risk study.    Echo7/27/18: - Left ventricle: The cavity size was normal. Systolic function was normal. The estimated ejection fraction was in  the range of 60% to 65%. Wall motion was normal; there were no regional wall motion abnormalities. There was an increased relative contribution of atrial contraction to ventricular filling. Doppler parameters are  consistent with abnormal left ventricular relaxation (grade 1 diastolic dysfunction). - Aortic valve: Trileaflet; normal thickness, mildly calcified leaflets.  Cardiac cath 06/18/13: Left mainstem: Normal  Left anterior descending (LAD): Mild calcification. 30% disease in the mid vessel at the take off of the first diagonal.  Left circumflex (LCx): 30% disease in the mid vessel.  Right coronary artery (RCA): Mild calcification. Mild disease in the proximal and mid vessel to 20%.  Left ventriculography: Left ventricular systolic function is normal, LVEF is estimated at 55-65%, there is no significant mitral regurgitation    ASSESSMENT & PLAN:    1. Swelling - some better with the dose of Lasix yesterday.   2. Chest pain - worrisome for angina - needs OV - needs labs - this may be cardiac - also need to consider worsening anemia. Needs labs and EKG. She does not have NTG on hand - BP little soft.   3. CAD - last cath with non obstructive disease from 2014 - low risk Myoview from 2018 - see #2.   4. Chronic respiratory failure - on oxygen.   5. Chronic diastolic HF - BP is good. Weight is down. Has had more swelling - took Lasix yesterday with good response.   6. HLD - not discussed. She is on statin.   7. Advanced age  48. COVID-19 Education: The signs and symptoms of COVID-19 were discussed with the patient and how to seek care for testing (follow up with PCP or arrange E-visit).  The importance of social distancing, staying at home, hand hygiene and wearing a mask when out in public were discussed today.  Patient Risk:   After full review of this patient's clinical status, I feel that they are at least moderate risk at this time.  Time:   Today, I have spent 13 minutes with the patient with telehealth technology discussing the above issues.     Medication Adjustments/Labs and Tests Ordered: Current medicines are reviewed at length with the patient today.  Concerns  regarding medicines are outlined above.   Tests Ordered: No orders of the defined types were placed in this encounter.   Medication Changes: Meds ordered this encounter  Medications  . furosemide (LASIX) 20 MG tablet    Sig: Take 1 tablet by mouth daily as needed for swelling.    Dispense:  30 tablet    Refill:  11    Order Specific Question:   Supervising Provider    Answer:   Martinique, PETER M [[id number]]    Disposition:  FU with DOD/APPOD tomorrow at [facility]. She will need labs (at least BMET and CBC) and EKG. I am happy to see back as needed.   Patient is agreeable to this plan and will call if any problems develop in the interim.   Amie Critchley, NP  12/11/2018 3:00 PM    [facility]

## 2018-12-10 NOTE — Telephone Encounter (Signed)
Patient also states that her ankles are swelling. She is not gaining weight, but can see marks from her socks. She would like to be seen as soon as possible.

## 2018-12-10 NOTE — Telephone Encounter (Signed)
°  Pt c/o of Chest Pain: STAT if CP now or developed within 24 hours  1. Are you having CP right now? no  2. Are you experiencing any other symptoms (ex. SOB, nausea, vomiting, sweating)? Heaviness in arms  3. How long have you been experiencing CP? About a month  4. Is your CP continuous or coming and going? Coming and going   5. Have you taken Nitroglycerin? No  Pt is unsure what to do. Was supposed to have an appointment in April. But it was cancelled due to COVID ?

## 2018-12-10 NOTE — Telephone Encounter (Signed)
I spoke with  Pt. She reports she has been having swelling in her ankles for awhile.  Took lasix a couple times and it helped but lasix is old and she didn't know if she should take. Also had chest pain a month or so ago.  Today she had shoulder and arm pain in the morning.  She does not know if this is heart related or possibly arthritis.   I told pt it would be OK to take lasix for now and to try and keep feet elevated. I scheduled her for a video visit with Marcellina Millin, NP tomorrow at 2:30. She does not have scales at home but does have BP cuff and pulse oximeter.   Consent obtained today  Virtual Visit Pre-Appointment Phone Call  "(Name), I am calling you today to discuss your upcoming appointment. We are currently trying to limit exposure to the virus that causes COVID-19 by seeing patients at home rather than in the office."  1. "What is the BEST phone number to call the day of the visit?" - include this in appointment notes-[phone]  2. "Do you have or have access to (through a family member/friend) a smartphone with video capability that we can use for your visit?" yes a. If yes - list this number in appt notes as "cell" (if different from BEST phone #) and list the appointment type as a VIDEO visit in appointment notes b. If no - list the appointment type as a PHONE visit in appointment notes  3. Confirm consent - "In the setting of the current Covid19 crisis, you are scheduled for a video visit with your provider on June 2,2020 at 2:30.  Just as we do with many in-office visits, in order for you to participate in this visit, we must obtain consent.  If you'd like, I can send this to your mychart (if signed up) or email for you to review.  Otherwise, I can obtain your verbal consent now.  All virtual visits are billed to your insurance company just like a normal visit would be.  By agreeing to a virtual visit, we'd like you to understand that the technology does not allow for your  provider to perform an examination, and thus may limit your provider's ability to fully assess your condition. If your provider identifies any concerns that need to be evaluated in person, we will make arrangements to do so.  Finally, though the technology is pretty good, we cannot assure that it will always work on either your or our end, and in the setting of a video visit, we may have to convert it to a phone-only visit.  In either situation, we cannot ensure that we have a secure connection.  Are you willing to proceed?" STAFF: Did the patient verbally acknowledge consent to telehealth visit? Document YES/NO here: yes  4. Advise patient to be prepared - "Two hours prior to your appointment, go ahead and check your blood pressure, pulse, oxygen saturation, and your weight (if you have the equipment to check those) and write them all down. When your visit starts, your provider will ask you for this information. If you have an Apple Watch or Kardia device, please plan to have heart rate information ready on the day of your appointment. Please have a pen and paper handy nearby the day of the visit as well."  5. Give patient instructions for MyChart download to smartphone OR Doximity/Doxy.me as below if video visit (depending on what platform provider is using)  6. Inform patient they will receive a phone call 15 minutes prior to their appointment time (may be from unknown caller ID) so they should be prepared to answer    TELEPHONE CALL NOTE  [name] [name] has been deemed a candidate for a follow-up tele-health visit to limit community exposure during the Covid-19 pandemic. I spoke with the patient via phone to ensure availability of phone/video source, confirm preferred email & phone number, and discuss instructions and expectations.  I reminded [name] [name] to be prepared with any vital sign and/or heart rhythm information that could potentially be obtained via home monitoring, at the time of her  visit. I reminded [name] [name] to expect a phone call prior to her visit.  Leodis Liverpool, RN 12/10/2018 3:53 PM   INSTRUCTIONS FOR DOWNLOADING THE MYCHART APP TO SMARTPHONE  - The patient must first make sure to have activated MyChart and know their login information - If Apple, go to [employer] and type in MyChart in the search bar and download the app. If Android, ask patient to go to [employer] and type in [place] in the search bar and download the app. The app is free but as with any other app downloads, their phone may require them to verify saved payment information or Apple/Android password.  - The patient will need to then log into the app with their MyChart username and password, and select [facility] as their healthcare provider to link the account. When it is time for your visit, go to the MyChart app, find appointments, and click Begin Video Visit. Be sure to Select Allow for your device to access the Microphone and Camera for your visit. You will then be connected, and your provider will be with you shortly.  **If they have any issues connecting, or need assistance please contact MyChart service desk [phone])**  **If using a computer, in order to ensure the best quality for their visit they will need to use either of the following Internet Browsers: [employer], or Google Chrome**  IF USING DOXIMITY or DOXY.ME - The patient will receive a link just prior to their visit by text.     FULL LENGTH CONSENT FOR TELE-HEALTH VISIT   I hereby voluntarily request, consent and authorize [facility] and its employed or contracted physicians, physician assistants, nurse practitioners or other licensed health care professionals (the Practitioner), to provide me with telemedicine health care services (the "Services") as deemed necessary by the treating Practitioner. I acknowledge and consent to receive the Services by the Practitioner via telemedicine. I  understand that the telemedicine visit will involve communicating with the Practitioner through live audiovisual communication technology and the disclosure of certain medical information by electronic transmission. I acknowledge that I have been given the opportunity to request an in-person assessment or other available alternative prior to the telemedicine visit and am voluntarily participating in the telemedicine visit.  I understand that I have the right to withhold or withdraw my consent to the use of telemedicine in the course of my care at any time, without affecting my right to future care or treatment, and that the Practitioner or I may terminate the telemedicine visit at any time. I understand that I have the right to inspect all information obtained and/or recorded in the course of the telemedicine visit and may receive copies of available information for a reasonable fee.  I understand that some of the potential risks of receiving the Services via telemedicine include:  .  Delay or interruption in medical evaluation due to technological equipment failure or disruption; . Information transmitted may not be sufficient (e.g. poor resolution of images) to allow for appropriate medical decision making by the Practitioner; and/or  . In rare instances, security protocols could fail, causing a breach of personal health information.  Furthermore, I acknowledge that it is my responsibility to provide information about my medical history, conditions and care that is complete and accurate to the best of my ability. I acknowledge that Practitioner's advice, recommendations, and/or decision may be based on factors not within their control, such as incomplete or inaccurate data provided by me or distortions of diagnostic images or specimens that may result from electronic transmissions. I understand that the practice of medicine is not an exact science and that Practitioner makes no warranties or guarantees  regarding treatment outcomes. I acknowledge that I will receive a copy of this consent concurrently upon execution via email to the email address I last provided but may also request a printed copy by calling the office of [facility].    I understand that my insurance will be billed for this visit.   I have read or had this consent read to me. . I understand the contents of this consent, which adequately explains the benefits and risks of the Services being provided via telemedicine.  . I have been provided ample opportunity to ask questions regarding this consent and the Services and have had my questions answered to my satisfaction. . I give my informed consent for the services to be provided through the use of telemedicine in my medical care  By participating in this telemedicine visit I agree to the above.

## 2018-12-11 ENCOUNTER — Telehealth: Payer: Self-pay | Admitting: *Deleted

## 2018-12-11 ENCOUNTER — Encounter: Payer: Self-pay | Admitting: Nurse Practitioner

## 2018-12-11 ENCOUNTER — Other Ambulatory Visit: Payer: Self-pay

## 2018-12-11 ENCOUNTER — Telehealth (INDEPENDENT_AMBULATORY_CARE_PROVIDER_SITE_OTHER): Payer: Medicare Other | Admitting: Nurse Practitioner

## 2018-12-11 VITALS — BP 104/65 | HR 101 | Ht 64.5 in | Wt 120.0 lb

## 2018-12-11 DIAGNOSIS — Z7189 Other specified counseling: Secondary | ICD-10-CM

## 2018-12-11 DIAGNOSIS — I25119 Atherosclerotic heart disease of native coronary artery with unspecified angina pectoris: Secondary | ICD-10-CM | POA: Diagnosis not present

## 2018-12-11 DIAGNOSIS — I5032 Chronic diastolic (congestive) heart failure: Secondary | ICD-10-CM

## 2018-12-11 DIAGNOSIS — J9611 Chronic respiratory failure with hypoxia: Secondary | ICD-10-CM

## 2018-12-11 DIAGNOSIS — E7849 Other hyperlipidemia: Secondary | ICD-10-CM

## 2018-12-11 MED ORDER — FUROSEMIDE 20 MG PO TABS: ORAL_TABLET | ORAL | 11 refills | Status: DC

## 2018-12-11 NOTE — Telephone Encounter (Signed)
    COVID-19 Pre-Screening Questions:  . In the past 7 to 10 days have you had a cough,  shortness of breath, headache, congestion, fever (100 or greater) body aches, chills, sore throat, or sudden loss of taste or sense of smell? . Have you been around anyone with known Covid 19. . Have you been around anyone who is awaiting Covid 19 test results in the past 7 to 10 days? . Have you been around anyone who has been exposed to Covid 19, or has mentioned symptoms of Covid 19 within the past 7 to 10 days?  If you have any concerns/questions about symptoms patients report during screening (either on the phone or at threshold). Contact the provider seeing the patient or DOD for further guidance.  If neither are available contact a member of the leadership team.   NO

## 2018-12-11 NOTE — Progress Notes (Signed)
Cardiology Office Note:    Date:  12/12/2018   ID:  Rasheedah, Reis 05-Jan-1931, MRN [id number]  PCP:  Hoyt Koch, MD  Cardiologist:  Lauree Chandler, MD   Electrophysiologist:  None   Referring MD: Hoyt Koch, *   Chief Complaint  Patient presents with  . Chest Pain     History of Present Illness:    [name] [name] is a 85 y.o. female with  nonobstructive CAD, prior DVT/pulmonary embolism, HL, RA with pulmonary fibrosis. Cardiac catheterization in 12/14 demonstrated mild nonobstructive CAD.  She was high risk for falls and was taken off of Warfarin. She takes Lovenox injections when she travels long distances.  She was last seen yesterday via Telemedicine with Truitt Merle, NP.  She complained of leg swelling, chest pain with activity.    [name] [name] returns for evaluation of chest discomfort and lower extremity swelling.  She has a long history of chest discomfort.  She had a cardiac catheterization in 2007 as well as 2014.  Both procedures demonstrated mild nonobstructive coronary disease.  She mainly has lower extremity swelling with standing.  This tends to resolve with leg elevation.  Yesterday she had some swelling in her legs when she awoke.  In the last few months, she has noticed substernal chest tightness with activity.  She can sometimes get this when she walks up to the mailbox.  She has chronic shortness of breath which is unchanged.  She has not had orthopnea, paroxysmal nocturnal dyspnea or syncope.  She did go to the emergency room in December.  CT scan was negative for pulmonary embolism at that time.  Prior CV studies:   The following studies were reviewed today:  Chest CTA 06/18/18 IMPRESSION: 1. No pulmonary emboli or acute abnormality. 2. Stable changes of COPD and interstitial fibrosis with honeycombing. 3. Mild increase in size of a mildly enlarged right hilar node, most likely reactive. 4. Calcified coronary artery and aortic  atherosclerosis. 5. Colonic diverticulosis. 6. Small hiatal hernia. Aortic Atherosclerosis (ICD10-I70.0) and Emphysema (ICD10-J43.9).  Myoview 06/08/17  Nuclear stress EF: 97%.  The study is normal. No ischemia  This is a low risk study.   Echo 02/04/17 EF 60-65, no RWMA, Gr 1 DD  Myoview 12/08/15  Nuclear stress EF: 74%.  There was no ST segment deviation noted during stress.  This is a low risk study. Normal perfusion, no ischemia identified.   LHC 06/18/13 Left mainstem: Normal Left anterior descending (LAD): Mild calcification. 30% disease in the mid vessel at the take off of the first diagonal. Left circumflex (LCx): 30% disease in the mid vessel. Right coronary artery (RCA): Mild calcification. Mild disease in the proximal and mid vessel to 20%. Left ventriculography: Left ventricular systolic function is normal, LVEF is estimated at 55-65%, there is no significant mitral regurgitation  Final Conclusions:  1. Nonobstructive CAD 2. Normal LV function  Myoview 8/13 Overall Impression: Normal stress nuclear study.  LV Ejection Fraction: 85%. LV Wall Motion: NL LV Function; NL Wall Motion  Echo 8/13 EF 55-60%, no RWMA, Gr 1 DD, mild reduced RVSF, PASP 26 mmHg   Past Medical History:  Diagnosis Date  . Allergic rhinitis   . Allergy   . CAD (coronary artery disease)    Mild CAD by cath 2008  . DJD (degenerative joint disease)    rheumatoid  . DVT (deep venous thrombosis) ([place])   . Dyslipidemia   . History of nuclear stress test  Myoview 5/17:  EF 74%, normal perfusion, low risk  . History of pulmonary embolism   . Hyperlipidemia   . Insomnia   . Osteoporosis   . Psoriasis   . Pulmonary fibrosis ([place])   . Rheumatoid arthritis ([place])    Surgical Hx: The patient  has a past surgical history that includes Total knee arthroplasty (1997, 2007); Tonsillectomy (1941, 1951); left heart catheterization with coronary angiogram (N/A, 06/18/2013); Cataract  extraction; Abdominal hysterectomy (1974); and Appendectomy (4403).   Current Medications: Current Meds  Medication Sig  . ALPRAZolam (XANAX) 0.25 MG tablet Take 1 tablet (0.25 mg total) by mouth at bedtime as needed for anxiety.  [name] aspirin EC 81 MG tablet Take 1 tablet (81 mg total) by mouth daily.  [name] atorvastatin (LIPITOR) 10 MG tablet Take 1 tablet (10 mg total) by mouth daily. Please make yearly appt with Dr. Angelena Form for June for future refills. 1st attempt  . dapsone 100 MG tablet Take 1 tablet (100 mg total) by mouth daily.  . fluticasone (FLONASE) 50 MCG/ACT nasal spray Place 2 sprays into both nostrils daily as needed for allergies or rhinitis.  . furosemide (LASIX) 20 MG tablet Take 1 tablet by mouth daily as needed for swelling.  . hydrocortisone (ANUSOL-HC) 2.5 % rectal cream Place 1 application rectally 2 (two) times daily.  . InFLIXimab (REMICADE IV) Inject into the vein.  [name] latanoprost (XALATAN) 0.005 % ophthalmic solution Place 1 drop into both eyes at bedtime.   . levalbuterol (XOPENEX HFA) 45 MCG/ACT inhaler Inhale 2 puffs into the lungs every 6 (six) hours as needed for wheezing.  . mycophenolate (CELLCEPT) 500 MG tablet 2 tabs bid  . Polyethyl Glycol-Propyl Glycol (SYSTANE) 0.4-0.3 % SOLN Apply 1 drop to eye daily as needed (for dry eyes).  . polyethylene glycol powder (GLYCOLAX/MIRALAX) powder Take 17 g by mouth 2 (two) times daily as needed.  . predniSONE (DELTASONE) 5 MG tablet Take 5 mg by mouth daily with breakfast.  . triamcinolone cream (KENALOG) 0.1 % Apply 1 application topically 2 (two) times daily.  . valACYclovir (VALTREX) 1000 MG tablet Take 1 tablet (1,000 mg total) by mouth 2 (two) times daily.     Allergies:   Crestor [rosuvastatin]; Lactose intolerance (gi); Penicillins; Imuran [azathioprine]; and Niacin   Social History   Tobacco Use  . Smoking status: Former Smoker    Packs/day: 0.30    Years: 20.00    Pack years: 6.00    Types: Cigarettes     Last attempt to quit: 07/11/1973    Years since quitting: 45.4  . Smokeless tobacco: Never Used  Substance Use Topics  . Alcohol use: No  . Drug use: No     Family Hx: The patient's family history includes Alzheimer's disease in her sister and sister; Cancer in her daughter; Heart attack (age of onset: 64) in her father; Heart disease in her sister; Kidney disease (age of onset: 70) in her daughter.  ROS:   Please see the history of present illness.    ROS All other systems reviewed and are negative.   EKGs/Labs/Other Test Reviewed:    EKG:  EKG is  ordered today.  The ekg ordered today demonstrates normal sinus rhythm, heart rate 86, normal axis, no ST-T wave changes, QTC 437  Recent Labs: 09/20/2018: ALT 10; BUN 10; Creatinine, Ser 0.74; Hemoglobin 10.3; Platelets 237.0; Potassium 3.9; Sodium 138   Recent Lipid Panel Lab Results  Component Value Date/Time   CHOL 177 12/23/2016 09:31 AM  TRIG 96 12/23/2016 09:31 AM   HDL 70 12/23/2016 09:31 AM   CHOLHDL 2.5 12/23/2016 09:31 AM   CHOLHDL 3.1 10/13/2015 09:45 AM   LDLCALC 88 12/23/2016 09:31 AM    Physical Exam:    VS:  BP 110/60   Pulse 94   Ht 5' 4.5" (1.638 m)   Wt 122 lb 12.8 oz (55.7 kg)   SpO2 90%   BMI 20.75 kg/m     Wt Readings from Last 3 Encounters:  12/12/18 122 lb 12.8 oz (55.7 kg)  12/11/18 120 lb (54.4 kg)  09/13/18 125 lb (56.7 kg)     Physical Exam  Constitutional: She is oriented to person, place, and time. She appears well-developed and well-nourished. No distress.  HENT:  Head: Normocephalic and atraumatic.  Eyes: No scleral icterus.  Neck: Neck supple. No JVD present. No thyromegaly present.  Cardiovascular: Normal rate, regular rhythm, S1 normal, S2 normal and normal heart sounds.  No murmur heard. Pulmonary/Chest: Effort normal. She has no rales.  Abdominal: Soft. She exhibits no distension. There is no hepatomegaly.  Musculoskeletal:        General: Edema (tr-1+ bilateral ankle edema )  present.  Neurological: She is alert and oriented to person, place, and time.  Skin: Skin is warm and dry.  Psychiatric: She has a normal mood and affect.    ASSESSMENT & PLAN:    Exertional chest pain She notes about 4 months of exertional chest tightness.  Her symptoms sound consistent with angina.  However, she has had chronic chest discomfort in the past.  She has had 2 heart catheterizations with mild nonobstructive disease.  We discussed the pros and cons of proceeding with cardiac catheterization.  Given her advanced age, she is at higher risk of complications.  Her COVID risk score is 6.  I have recommended that we proceed with noninvasive testing and medical therapy, initially.  If she fails medical therapy or her noninvasive testing is abnormal, we can certainly proceed with cardiac catheterization.  -Start metoprolol succinate 25 mg daily  -Prescription given for as needed nitroglycerin  -Labs: BMET, CBC, BNP  -Arrange 2D echocardiogram  -Arrange Lexiscan Myoview  -Close follow-up in 3-4 weeks  SOB (shortness of breath) She does have chronic shortness of breath.  However, I will obtain a BNP and echocardiogram.  If her BNP is significant elevated, I will start her on low-dose Lasix.  Coronary artery disease involving native coronary artery of native heart without angina pectoris  As noted, she has had mild nonobstructive coronary artery disease by cardiac catheterization in the past.  She had a nonischemic Myoview in 2018.  As noted, her symptoms are somewhat concerning for angina.  I will arrange for her to undergo a Lexiscan Myoview to rule out ischemia.  Continue aspirin, statin therapy.  Other hyperlipidemia Continue statin therapy.  Postinflammatory pulmonary fibrosis (HCC) I suspect her shortness of breath is mainly related to pulmonary fibrosis.   Dispo:  Return in about 4 weeks (around 01/09/2019) for Close Follow Up w/  Dr. Angelena Form.   Medication Adjustments/Labs and  Tests Ordered: Current medicines are reviewed at length with the patient today.  Concerns regarding medicines are outlined above.  Tests Ordered: Orders Placed This Encounter  Procedures  . Pro b natriuretic peptide (BNP)  . CBC  . Basic metabolic panel  . MYOCARDIAL PERFUSION IMAGING  . EKG 12-Lead  . ECHOCARDIOGRAM COMPLETE   Medication Changes: Meds ordered this encounter  Medications  . metoprolol succinate (  TOPROL XL) 25 MG 24 hr tablet    Sig: Take 1 tablet (25 mg total) by mouth daily.    Dispense:  30 tablet    Refill:  0  . nitroGLYCERIN (NITROSTAT) 0.4 MG SL tablet    Sig: Place 1 tablet (0.4 mg total) under the tongue every 5 (five) minutes as needed for chest pain.    Dispense:  25 tablet    Refill:  3    Signed, Richardson Dopp, PA-C  12/12/2018 5:50 PM    [facility] [place], [place], [place]  [address] Phone: [phone]; Fax: [phone]

## 2018-12-11 NOTE — Patient Instructions (Addendum)
After Visit Summary:  We will be checking the following labs today - NONE  She will need lab at her visit tomorrow - BMET/CBC at least    Medication Instructions:    Continue with your current medicines.   I refilled your Lasix today to your local pharmacy   If you need a refill on your cardiac medications before your next appointment, please call your pharmacy.     Testing/Procedures To Be Arranged:  N/A  Follow-Up:   See one of our providers tomorrow at [place], you and your health needs are our priority.  As part of our continuing mission to provide you with exceptional heart care, we have created designated Provider Care Teams.  These Care Teams include your primary Cardiologist (physician) and Advanced Practice Providers (APPs -  Physician Assistants and Nurse Practitioners) who all work together to provide you with the care you need, when you need it.  Special Instructions:  . Stay safe, stay home, wash your hands for at least 20 seconds and wear a mask when out in public.  . It was good to talk with you today.    Call the [place] office at [phone] if you have any questions, problems or concerns.

## 2018-12-12 ENCOUNTER — Other Ambulatory Visit: Payer: Self-pay

## 2018-12-12 ENCOUNTER — Encounter: Payer: Self-pay | Admitting: *Deleted

## 2018-12-12 ENCOUNTER — Encounter: Payer: Self-pay | Admitting: Physician Assistant

## 2018-12-12 ENCOUNTER — Ambulatory Visit (INDEPENDENT_AMBULATORY_CARE_PROVIDER_SITE_OTHER): Payer: Medicare Other | Admitting: Physician Assistant

## 2018-12-12 VITALS — BP 110/60 | HR 94 | Ht 64.5 in | Wt 122.8 lb

## 2018-12-12 DIAGNOSIS — E7849 Other hyperlipidemia: Secondary | ICD-10-CM | POA: Diagnosis not present

## 2018-12-12 DIAGNOSIS — R0602 Shortness of breath: Secondary | ICD-10-CM | POA: Diagnosis not present

## 2018-12-12 DIAGNOSIS — I25119 Atherosclerotic heart disease of native coronary artery with unspecified angina pectoris: Secondary | ICD-10-CM

## 2018-12-12 DIAGNOSIS — R079 Chest pain, unspecified: Secondary | ICD-10-CM

## 2018-12-12 DIAGNOSIS — J8410 Pulmonary fibrosis, unspecified: Secondary | ICD-10-CM

## 2018-12-12 DIAGNOSIS — I2510 Atherosclerotic heart disease of native coronary artery without angina pectoris: Secondary | ICD-10-CM

## 2018-12-12 MED ORDER — NITROGLYCERIN 0.4 MG SL SUBL: 0.4000 mg | SUBLINGUAL_TABLET | SUBLINGUAL | 3 refills | Status: DC | PRN

## 2018-12-12 MED ORDER — METOPROLOL SUCCINATE ER 25 MG PO TB24: 25.0000 mg | ORAL_TABLET | Freq: Every day | ORAL | 0 refills | Status: DC

## 2018-12-12 NOTE — Patient Instructions (Addendum)
Medication Instructions:   Start taking Toprol Xl 25 mg once a day  Start taking Nitroglycerins 0.4 mg sublingual as needed    If you need a refill on your cardiac medications before your next appointment, please call your pharmacy.   Lab work: Multimedia programmer and bnp  Today   If you have labs (blood work) drawn today and your tests are completely normal, you will receive your results only by: [name] MyChart Message (if you have MyChart) OR . A paper copy in the mail If you have any lab test that is abnormal or we need to change your treatment, we will call you to review the results.  Testing/Procedures: Your physician has requested that you have an echocardiogram. Echocardiography is a painless test that uses sound waves to create images of your heart. It provides your doctor with information about the size and shape of your heart and how well your heart's chambers and valves are working. This procedure takes approximately one hour. There are no restrictions for this procedure.  Your physician has requested that you have a lexiscan myoview. For further information please visit [email or web address]. Please follow instruction sheet, as given.     Follow Up: At [facility], you and your health needs are our priority.  As part of our continuing mission to provide you with exceptional heart care, we have created designated Provider Care Teams.  These Care Teams include your primary Cardiologist (physician) and Advanced Practice Providers (APPs -  Physician Assistants and Nurse Practitioners) who all work together to provide you with the care you need, when you need it. You will need a follow up appointment in 3-  4 weeks.  Please call our office 2 months in advance to schedule this appointment.  You may see Lauree Chandler, MD or one of the following Advanced Practice Providers on your designated Care Team:   [place], PA-C Melina Copa, PA-C . Ermalinda Barrios, PA-C  Any Other Special  Instructions Will Be Listed Below (If Applicable).

## 2018-12-13 LAB — BASIC METABOLIC PANEL
BUN/Creatinine Ratio: 23 (ref 12–28)
BUN: 16 mg/dL (ref 8–27)
CO2: 24 mmol/L (ref 20–29)
Calcium: 9.3 mg/dL (ref 8.7–10.3)
Chloride: 104 mmol/L (ref 96–106)
Creatinine, Ser: 0.7 mg/dL (ref 0.57–1.00)
GFR calc Af Amer: 91 mL/min/{1.73_m2} (ref >59)
GFR calc non Af Amer: 79 mL/min/{1.73_m2} (ref >59)
Glucose: 87 mg/dL (ref 65–99)
Potassium: 4.2 mmol/L (ref 3.5–5.2)
Sodium: 141 mmol/L (ref 134–144)

## 2018-12-13 LAB — CBC
Hematocrit: 33.6 % — ABNORMAL LOW (ref 34.0–46.6)
Hemoglobin: 10.8 g/dL — ABNORMAL LOW (ref 11.1–15.9)
MCH: 30.3 pg (ref 26.6–33.0)
MCHC: 32.1 g/dL (ref 31.5–35.7)
MCV: 94 fL (ref 79–97)
Platelets: 189 10*3/uL (ref 150–450)
RBC: 3.56 x10E6/uL — ABNORMAL LOW (ref 3.77–5.28)
RDW: 13 % (ref 11.7–15.4)
WBC: 9.9 10*3/uL (ref 3.4–10.8)

## 2018-12-13 LAB — PRO B NATRIURETIC PEPTIDE: NT-Pro BNP: 120 pg/mL (ref 0–738)

## 2018-12-20 ENCOUNTER — Ambulatory Visit: Payer: Medicare Other | Admitting: Internal Medicine

## 2018-12-20 ENCOUNTER — Ambulatory Visit (INDEPENDENT_AMBULATORY_CARE_PROVIDER_SITE_OTHER): Payer: Medicare Other | Admitting: Internal Medicine

## 2018-12-20 DIAGNOSIS — I25119 Atherosclerotic heart disease of native coronary artery with unspecified angina pectoris: Secondary | ICD-10-CM | POA: Diagnosis not present

## 2018-12-20 DIAGNOSIS — D500 Iron deficiency anemia secondary to blood loss (chronic): Secondary | ICD-10-CM | POA: Diagnosis not present

## 2018-12-20 NOTE — Progress Notes (Signed)
Virtual Visit via Video Note  I connected with [name] [name] on 12/20/18 at  2:40 PM EDT by a video enabled telemedicine application and verified that I am speaking with the correct person using two identifiers.  The patient and the provider were at separate locations throughout the entire encounter.   I discussed the limitations of evaluation and management by telemedicine and the availability of in person appointments. The patient expressed understanding and agreed to proceed.  History of Present Illness: The patient is a 85 y.o. female with visit for iron deficiency anemia. Started after having blood in stool back in February and March. She had about a 3 point drop in Hemoglobin at that time. She is still having very rare episodes of blood in stool without pain. She was taking iron pills for a short time after our visit in March but then stopped taking it. She was thinking that it was making her feel off. Denies fevers or chills. Denies cough. Some SOB about the same as usual. Has no dark stools at this time. She went to her cardiologist and they rechecked labs and were concerned her anemia may be contributing to some mild increase in SOB. Denies chest pains. Overall it is stable since 3 months ago. Has tried nothing  Observations/Objective: Appearance: normal, breathing appears normal, casual grooming, abdomen does not appear distended, throat normal, mental status is A and O times 3  Assessment and Plan: See problem oriented charting  Follow Up Instructions: arrange iron infusion as she already has high pill burden and needs iron   I discussed the assessment and treatment plan with the patient. The patient was provided an opportunity to ask questions and all were answered. The patient agreed with the plan and demonstrated an understanding of the instructions.   The patient was advised to call back or seek an in-person evaluation if the symptoms worsen or if the condition fails to improve as  anticipated.  Hoyt Koch, MD

## 2018-12-21 ENCOUNTER — Encounter: Payer: Self-pay | Admitting: Internal Medicine

## 2018-12-21 DIAGNOSIS — D509 Iron deficiency anemia, unspecified: Secondary | ICD-10-CM | POA: Insufficient documentation

## 2018-12-21 NOTE — Assessment & Plan Note (Addendum)
Will order iron infusion to see if this helps with symptoms. Hg levels were actually mildly improved at recent cardiology visit. She is reporting less blood in stool over the last 2-3 months. Cannot tolerate oral iron. Will recheck levels about 3-4 weeks after infusion.

## 2018-12-24 ENCOUNTER — Ambulatory Visit (HOSPITAL_COMMUNITY)
Admission: RE | Admit: 2018-12-24 | Discharge: 2018-12-24 | Disposition: A | Discharge status: Home / Self Care | Payer: Medicare Other | Source: Ambulatory Visit | Attending: Internal Medicine | Admitting: Internal Medicine

## 2018-12-24 ENCOUNTER — Other Ambulatory Visit: Payer: Self-pay

## 2018-12-24 DIAGNOSIS — D509 Iron deficiency anemia, unspecified: Secondary | ICD-10-CM | POA: Diagnosis not present

## 2018-12-24 MED ORDER — SODIUM CHLORIDE 0.9 % IV SOLN
25.0000 mg | Freq: Once | INTRAVENOUS | Status: AC
  Administered 2018-12-24: 25 mg via INTRAVENOUS
  Filled 2018-12-24: qty 0.5

## 2018-12-24 MED ORDER — SODIUM CHLORIDE 0.9 % IV SOLN
INTRAVENOUS | Status: DC | PRN
  Administered 2018-12-24: 250 mL via INTRAVENOUS

## 2018-12-24 MED ORDER — SODIUM CHLORIDE 0.9 % IV SOLN
1000.0000 mg | Freq: Once | INTRAVENOUS | Status: AC
  Administered 2018-12-24: 1000 mg via INTRAVENOUS
  Filled 2018-12-24: qty 20

## 2018-12-24 NOTE — Discharge Instructions (Signed)

## 2018-12-24 NOTE — Progress Notes (Addendum)
PATIENT CARE CENTER NOTE  Diagnosis: Iron Deficiency Anemia    Provider: Pricilla Holm, MD   Procedure: IV Infed   Note: Patient received IV Infed. Test dose given with no adverse reaction. Full dose given. Patient tolerated well. Vital signs stable. Patient alert, oriented and ambulatory at discharge.

## 2018-12-25 ENCOUNTER — Telehealth (HOSPITAL_COMMUNITY): Payer: Self-pay | Admitting: *Deleted

## 2018-12-25 NOTE — Telephone Encounter (Signed)
Patient given detailed instructions per Myocardial Perfusion Study Information Sheet for the test on 12/27/18 at 7:15. Patient notified to arrive 15 minutes early and that it is imperative to arrive on time for appointment to keep from having the test rescheduled.  If you need to cancel or reschedule your appointment, please call the office within 24 hours of your appointment. . Patient verbalized understanding.Veronia Beets

## 2018-12-26 ENCOUNTER — Telehealth (HOSPITAL_COMMUNITY): Payer: Self-pay | Admitting: *Deleted

## 2018-12-26 NOTE — Telephone Encounter (Signed)

## 2018-12-27 ENCOUNTER — Ambulatory Visit (HOSPITAL_BASED_OUTPATIENT_CLINIC_OR_DEPARTMENT_OTHER): Payer: Medicare Other

## 2018-12-27 ENCOUNTER — Other Ambulatory Visit: Payer: Self-pay

## 2018-12-27 ENCOUNTER — Encounter (HOSPITAL_COMMUNITY): Payer: Self-pay

## 2018-12-27 ENCOUNTER — Ambulatory Visit (HOSPITAL_COMMUNITY): Payer: Medicare Other | Attending: Cardiology

## 2018-12-27 DIAGNOSIS — R0602 Shortness of breath: Secondary | ICD-10-CM | POA: Diagnosis not present

## 2018-12-27 DIAGNOSIS — R079 Chest pain, unspecified: Secondary | ICD-10-CM | POA: Diagnosis not present

## 2018-12-27 LAB — MYOCARDIAL PERFUSION IMAGING
LV dias vol: 41 mL (ref 46–106)
LV sys vol: 5 mL
Peak HR: 97 {beats}/min
Rest HR: 75 {beats}/min
SDS: 2
SRS: 0
SSS: 2
TID: 0.91

## 2018-12-27 MED ORDER — REGADENOSON 0.4 MG/5ML IV SOLN
0.4000 mg | Freq: Once | INTRAVENOUS | Status: AC
  Administered 2018-12-27: 0.4 mg via INTRAVENOUS

## 2018-12-27 MED ORDER — TECHNETIUM TC 99M TETROFOSMIN IV KIT
10.5000 | PACK | Freq: Once | INTRAVENOUS | Status: AC | PRN
  Administered 2018-12-27: 10.5 via INTRAVENOUS
  Filled 2018-12-27: qty 11

## 2018-12-27 MED ORDER — TECHNETIUM TC 99M TETROFOSMIN IV KIT
30.3000 | PACK | Freq: Once | INTRAVENOUS | Status: AC | PRN
  Administered 2018-12-27: 30.3 via INTRAVENOUS
  Filled 2018-12-27: qty 31

## 2018-12-28 ENCOUNTER — Encounter: Payer: Self-pay | Admitting: Physician Assistant

## 2018-12-31 DIAGNOSIS — L409 Psoriasis, unspecified: Secondary | ICD-10-CM | POA: Diagnosis not present

## 2019-01-01 ENCOUNTER — Other Ambulatory Visit: Payer: Self-pay | Admitting: Nurse Practitioner

## 2019-01-01 ENCOUNTER — Other Ambulatory Visit: Payer: Self-pay | Admitting: Pulmonary Disease

## 2019-01-01 DIAGNOSIS — J849 Interstitial pulmonary disease, unspecified: Secondary | ICD-10-CM

## 2019-01-01 DIAGNOSIS — B370 Candidal stomatitis: Secondary | ICD-10-CM

## 2019-01-01 DIAGNOSIS — K130 Diseases of lips: Secondary | ICD-10-CM

## 2019-01-02 NOTE — Telephone Encounter (Signed)
It looks like she should be on 5 mg daily

## 2019-01-02 NOTE — Telephone Encounter (Signed)
Is this appropriate for refill ? 

## 2019-01-09 ENCOUNTER — Telehealth: Payer: Self-pay | Admitting: Cardiovascular Disease

## 2019-01-09 NOTE — Telephone Encounter (Signed)
New Message         COVID-19 Pre-Screening Questions:   In the past 7 to 10 days have you had a cough,  shortness of breath, headache, congestion, fever (100 or greater) body aches, chills, sore throat, or sudden loss of taste or sense of smell? SOB, she is on Oxygen, she says she is out of all energy, no fever   Have you been around anyone with known Covid 19. NO  Have you been around anyone who is awaiting Covid 19 test results in the past 7 to 10 days? YES, the pt went to the doctor for a sinus infection and is still waiting for results   Have you been around anyone who has been exposed to Covid 19, or has mentioned symptoms of Covid 19 within the past 7 to 10 days? NO  If you have any concerns/questions about symptoms patients report during screening (either on the phone or at [facility]). Contact the provider seeing the patient or DOD for further guidance.  If neither are available contact a member of the leadership team.

## 2019-01-09 NOTE — Telephone Encounter (Signed)
Spoke with the patient, the person she was around had a sinus infection that is currently being treated. 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 She said they were tested on 01/07/19. She did not know the results of the test. The patient is fine to come in to the office, since her problem was for a sinus infection.

## 2019-01-10 ENCOUNTER — Ambulatory Visit (INDEPENDENT_AMBULATORY_CARE_PROVIDER_SITE_OTHER): Payer: Medicare Other | Admitting: Cardiovascular Disease

## 2019-01-10 ENCOUNTER — Encounter: Payer: Self-pay | Admitting: Cardiovascular Disease

## 2019-01-10 ENCOUNTER — Other Ambulatory Visit: Payer: Self-pay

## 2019-01-10 VITALS — BP 106/60 | HR 97 | Ht 64.0 in | Wt 116.8 lb

## 2019-01-10 DIAGNOSIS — I5032 Chronic diastolic (congestive) heart failure: Secondary | ICD-10-CM

## 2019-01-10 DIAGNOSIS — E785 Hyperlipidemia, unspecified: Secondary | ICD-10-CM

## 2019-01-10 DIAGNOSIS — I2510 Atherosclerotic heart disease of native coronary artery without angina pectoris: Secondary | ICD-10-CM

## 2019-01-10 DIAGNOSIS — I25119 Atherosclerotic heart disease of native coronary artery with unspecified angina pectoris: Secondary | ICD-10-CM | POA: Diagnosis not present

## 2019-01-10 NOTE — Patient Instructions (Signed)
Medication Instructions:  Your provider recommends that you continue on your current medications as directed. Please refer to the Current Medication list given to you today.    Labwork: Your provider recommends that you return for FASTING blood work.  Testing/Procedures: None  Follow-Up: Your provider wants you to follow-up in: 1 year with Dr. Angelena Form. You will receive a reminder letter in the mail two months in advance. If you don't receive a letter, please call our office to schedule the follow-up appointment.

## 2019-01-10 NOTE — Progress Notes (Signed)
Chief Complaint  Patient presents with  . Follow-up    CAD     History of Present Illness: 85 yo female with history of CAD, DVT/PE, HLD, pulmonary fibrosis who is here today for cardiac follow up. I met her in 2013 with c/o chest pain and stress test in 2013 showed no ischemia. Remote cath in 2008 with mild CAD. Repeat cath in December 2014 and was found to have 30% mid LAD, 30% mid Circumflex, 20% mid RCA. She has not tolerated statins in the past. Episode of chest pain after a car trip May 2017. Nuclear stress test May 2017 with no ischemia. Chest CTA without acute PE. Echo July 2018 with normal LV systolic function, INOM=76%, grade 1 diastolic dysfunction, no significant valve disease. She has continued to have chest pains. Nuclear stress test 06/08/17 with no ischemia. She is now wearing supplemental oxygen 24/7. She was seen in our office 12/12/18 by Richardson Dopp, PA and c/o ongoing chest pain. She had been seen in the ED and CTA chest negative for PE. Nuclear stress test 12/27/18 was low risk with no large areas of ischemia. Echo 12/27/18 with normal LV systolic function. No valve issues.   She is here today for follow up. The patient denies any dyspnea, palpitations, lower extremity edema, orthopnea, PND, dizziness, near syncope or syncope. She has rare chest pains at rest.   Primary Care Physician: Hoyt Koch, MD  Past Medical History:  Diagnosis Date  . Allergic rhinitis   . Allergy   . CAD (coronary artery disease)    Mild CAD by cath 2008  . DJD (degenerative joint disease)    rheumatoid  . DVT (deep venous thrombosis) ([place])   . Dyslipidemia   . History of echocardiogram    Echo 12/2018: EF 60-65, mod asymmetric LVH, Gr 1 DD  . History of nuclear stress test    Myoview 5/17:  EF 74%, normal perfusion, low risk // Myoview 12/2018:  EF 89, very mild ischemia in inferoapical wall; Low Risk  . History of pulmonary embolism   . Hyperlipidemia   . Insomnia   . Osteoporosis    . Psoriasis   . Pulmonary fibrosis ([place])   . Rheumatoid arthritis [facility])     Past Surgical History:  Procedure Laterality Date  . ABDOMINAL HYSTERECTOMY  1974  . APPENDECTOMY  1959  . CATARACT EXTRACTION    . LEFT HEART CATHETERIZATION WITH CORONARY ANGIOGRAM N/A 06/18/2013   Procedure: LEFT HEART CATHETERIZATION WITH CORONARY ANGIOGRAM;  Surgeon: Peter M Martinique, MD;  Location: [facility] CATH LAB;  Service: Cardiovascular;  Laterality: N/A;  . TONSILLECTOMY  1941, 1951  . TOTAL KNEE ARTHROPLASTY  1997, 2007   Bilateral    Current Outpatient Medications  Medication Sig Dispense Refill  . ALPRAZolam (XANAX) 0.25 MG tablet Take 1 tablet (0.25 mg total) by mouth at bedtime as needed for anxiety. 15 tablet 0  . aspirin EC 81 MG tablet Take 1 tablet (81 mg total) by mouth daily.    [name] atorvastatin (LIPITOR) 10 MG tablet Take 1 tablet (10 mg total) by mouth daily. Please make yearly appt with Dr. Angelena Form for June for future refills. 1st attempt 90 tablet 1  . dapsone 100 MG tablet TAKE 1 TABLET(100 MG) BY MOUTH DAILY 90 tablet 0  . fluticasone (FLONASE) 50 MCG/ACT nasal spray Place 2 sprays into both nostrils daily as needed for allergies or rhinitis. 16 g 3  . furosemide (LASIX) 20 MG tablet Take 1  tablet by mouth daily as needed for swelling. 30 tablet 11  . hydrocortisone (ANUSOL-HC) 2.5 % rectal cream Place 1 application rectally 2 (two) times daily. 30 g 0  . InFLIXimab (REMICADE IV) Inject into the vein.    [name] latanoprost (XALATAN) 0.005 % ophthalmic solution Place 1 drop into both eyes at bedtime.   0  . levalbuterol (XOPENEX HFA) 45 MCG/ACT inhaler Inhale 2 puffs into the lungs every 6 (six) hours as needed for wheezing. 1 Inhaler 12  . metoprolol succinate (TOPROL XL) 25 MG 24 hr tablet Take 1 tablet (25 mg total) by mouth daily. 30 tablet 0  . mycophenolate (CELLCEPT) 500 MG tablet 2 tabs bid 360 tablet 3  . nitroGLYCERIN (NITROSTAT) 0.4 MG SL tablet Place 1 tablet (0.4 mg total) under the  tongue every 5 (five) minutes as needed for chest pain. 25 tablet 3  . Polyethyl Glycol-Propyl Glycol (SYSTANE) 0.4-0.3 % SOLN Apply 1 drop to eye daily as needed (for dry eyes).    . polyethylene glycol powder (GLYCOLAX/MIRALAX) powder Take 17 g by mouth 2 (two) times daily as needed. 3350 g 1  . predniSONE (DELTASONE) 10 MG tablet Take 0.5 tablets (5 mg total) by mouth daily with breakfast. 45 tablet 0  . predniSONE (DELTASONE) 5 MG tablet Take 5 mg by mouth daily with breakfast.    . triamcinolone cream (KENALOG) 0.1 % Apply 1 application topically 2 (two) times daily. 453.6 g 0  . valACYclovir (VALTREX) 1000 MG tablet Take 1 tablet (1,000 mg total) by mouth 2 (two) times daily. 60 tablet 0   No current facility-administered medications for this visit.     Allergies  Allergen Reactions  . Crestor [Rosuvastatin] Other (See Comments)    Muscle pain  . Lactose Intolerance (Gi) Other (See Comments)    Stomach upset  . Penicillins Hives  . Imuran [Azathioprine] Nausea And Vomiting  . Niacin Hives    Social History   Socioeconomic History  . Marital status: Widowed    Spouse name: Not on file  . Number of children: 2  . Years of education: Not on file  . Highest education level: Not on file  Occupational History  . 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 Financial resource strain: Not hard at all  . Food insecurity    Worry: Never true    Inability: Never true  . Transportation needs    Medical: No    Non-medical: No  Tobacco Use  . Smoking status: Former Smoker    Packs/day: 0.30    Years: 20.00    Pack years: 6.00    Types: Cigarettes    Quit date: 07/11/1973    Years since quitting: 45.5  . Smokeless tobacco: Never Used  Substance and Sexual Activity  . Alcohol use: No  . Drug use: No  . Sexual activity: Never  Lifestyle  . Physical activity    Days per week: 0 days    Minutes per session: 0 min  . Stress: Only a little   Relationships  . Social connections    Talks on phone: More than three times a week    Gets together: More than three times a week    Attends religious service: More than 4 times per year    Active member of club or organization: Yes    Attends meetings of clubs or organizations: More than 4 times per year    Relationship status: Widowed  .  Intimate partner violence    Fear of current or ex partner: Not on file    Emotionally abused: Not on file    Physically abused: Not on file    Forced sexual activity: Not on file  Other Topics Concern  . Not on file  Social History Narrative   Diet:   Do you drink/eat things with caffeine? Yes   Marital status:        Widowed                      What year were you married?1954   Do you live in a house, apartment, assisted living, condo, trailer, etc)? House   Is it one or more stories? 1 1/4   How many persons live in your home?1   Do you have any pets in your home? No   Current or past profession:  Publishing rights manager   Do you exercise?        Yes                                            Type & how often:  Walk                                    Do you have a living will? Yes   Do you have a DNR Form? Yes   Do you have a POA/HPOA forms?  Yes    Family History  Problem Relation Age of Onset  . Kidney disease Daughter 5  . Cancer Daughter   . Heart attack Father 17  . Alzheimer's disease Sister   . Heart disease Sister   . Alzheimer's disease Sister     Review of Systems:  As stated in the HPI and otherwise negative.   BP 106/60   Pulse 97   Ht _0  (1.626 m)   Wt 116 lb 12.8 oz (53 kg)   SpO2 97%   BMI 20.05 kg/m   Physical Examination:  General: Well developed, well nourished, NAD  HEENT: OP clear, mucus membranes moist  SKIN: warm, dry. No rashes. Neuro: No focal deficits  Musculoskeletal: Muscle strength 5/5 all ext  Psychiatric: Mood and affect normal  Neck: No JVD, no carotid bruits, no thyromegaly, no  lymphadenopathy.  Lungs:Clear bilaterally, no wheezes, rhonci, crackles Cardiovascular: Regular rate and rhythm. No murmurs, gallops or rubs. Abdomen:Soft. Bowel sounds present. Non-tender.  Extremities: No lower extremity edema. Pulses are 2 + in the bilateral DP/PT.  Echo 02/03/17: - Left ventricle: The cavity size was normal. Systolic function was   normal. The estimated ejection fraction was in the range of 60%   to 65%. Wall motion was normal; there were no regional wall   motion abnormalities. There was an increased relative   contribution of atrial contraction to ventricular filling.   Doppler parameters are consistent with abnormal left ventricular   relaxation (grade 1 diastolic dysfunction). - Aortic valve: Trileaflet; normal thickness, mildly calcified   leaflets.  Cardiac cath 06/18/13: Left mainstem: Normal  Left anterior descending (LAD): Mild calcification. 30% disease in the mid vessel at the take off of the first diagonal.  Left circumflex (LCx): 30% disease in the mid vessel.  Right coronary artery (RCA): Mild calcification. Mild disease in the proximal  and mid vessel to 20%.  Left ventriculography: Left ventricular systolic function is normal, LVEF is estimated at 55-65%, there is no significant mitral regurgitation   EKG:  EKG is not  ordered today. The ekg ordered today demonstrates   Recent Labs: 09/20/2018: ALT 10 12/12/2018: BUN 16; Creatinine, Ser 0.70; Hemoglobin 10.8; NT-Pro BNP 120; Platelets 189; Potassium 4.2; Sodium 141   Lipid Panel    Component Value Date/Time   CHOL 177 12/23/2016 0931   TRIG 96 12/23/2016 0931   HDL 70 12/23/2016 0931   CHOLHDL 2.5 12/23/2016 0931   CHOLHDL 3.1 10/13/2015 0945   VLDL 26 10/13/2015 0945   LDLCALC 88 12/23/2016 0931     Wt Readings from Last 3 Encounters:  01/10/19 116 lb 12.8 oz (53 kg)  12/27/18 122 lb (55.3 kg)  12/12/18 122 lb 12.8 oz (55.7 kg)     Other studies Reviewed: Additional studies/ records  that were reviewed today include: . Review of the above records demonstrates:    Assessment and Plan:   1. CAD without angina: She continues to have atypical chest pain. This is chronic. She is known to have mild non-obstructive CAD, last cath in 2014 and normal nuclear stress test in November 2018. Low risk stress test June 2020. Continue ASA, statin, beta blocker and Imdur.   2. Hyperlipidemia: No recent LDL. Will continue statin.   Check lipids and LFTs.   3. Chronic diastolic CHF: Weight is stable. No LE edema. Continue Lasix as needed  Current medicines are reviewed at length with the patient today.  The patient does not have concerns regarding medicines.  The following changes have been made:  no change  Labs/ tests ordered today include:   Orders Placed This Encounter  Procedures  . Lipid panel  . Hepatic function panel   Disposition:   FU with me  in 12  months  Signed, Lauree Chandler, MD 01/10/2019 4:44 PM    [facility] [address], [place], [place]  [address] Phone: [phone]; Fax: [phone]

## 2019-01-14 ENCOUNTER — Other Ambulatory Visit: Payer: Medicare Other

## 2019-01-18 ENCOUNTER — Other Ambulatory Visit: Payer: Medicare Other | Admitting: *Deleted

## 2019-01-18 ENCOUNTER — Other Ambulatory Visit: Payer: Self-pay

## 2019-01-18 DIAGNOSIS — I2510 Atherosclerotic heart disease of native coronary artery without angina pectoris: Secondary | ICD-10-CM

## 2019-01-19 LAB — HEPATIC FUNCTION PANEL
ALT: 10 IU/L (ref 0–32)
AST: 19 IU/L (ref 0–40)
Albumin: 4.1 g/dL (ref 3.6–4.6)
Alkaline Phosphatase: 76 IU/L (ref 39–117)
Bilirubin Total: 1 mg/dL (ref 0.0–1.2)
Bilirubin, Direct: 0.26 mg/dL (ref 0.00–0.40)
Total Protein: 6.9 g/dL (ref 6.0–8.5)

## 2019-01-19 LAB — LIPID PANEL
Chol/HDL Ratio: 3.3 ratio (ref 0.0–4.4)
Cholesterol, Total: 206 mg/dL — ABNORMAL HIGH (ref 100–199)
HDL: 62 mg/dL (ref >39)
LDL Calculated: 122 mg/dL — ABNORMAL HIGH (ref 0–99)
Triglycerides: 110 mg/dL (ref 0–149)
VLDL Cholesterol Cal: 22 mg/dL (ref 5–40)

## 2019-01-24 ENCOUNTER — Telehealth: Payer: Self-pay | Admitting: *Deleted

## 2019-01-24 DIAGNOSIS — Z79899 Other long term (current) drug therapy: Secondary | ICD-10-CM | POA: Diagnosis not present

## 2019-01-24 DIAGNOSIS — E785 Hyperlipidemia, unspecified: Secondary | ICD-10-CM

## 2019-01-24 DIAGNOSIS — M0579 Rheumatoid arthritis with rheumatoid factor of multiple sites without organ or systems involvement: Secondary | ICD-10-CM | POA: Diagnosis not present

## 2019-01-24 MED ORDER — ATORVASTATIN CALCIUM 20 MG PO TABS: 20.0000 mg | ORAL_TABLET | Freq: Every day | ORAL | 3 refills | Status: DC

## 2019-01-24 NOTE — Telephone Encounter (Signed)
I spoke with pt and reviewed lab results with her.  She reports her diet has not been good lately. She will try to make dietary changes and also increase Lipitor to 20 mg daily.  Will send new prescription to [facility].  Pt will come in for fasting lab work around 10:00 on October 6,2020

## 2019-01-24 NOTE — Telephone Encounter (Signed)
-----   Message from Burnell Blanks, MD sent at 01/23/2019  8:39 AM EDT ----- Her LDL is not at goal. I would increase her Lipitor to 20 mg daily. LFTs ok. Repeat lipids and LFTS in 12 weeks. cdm

## 2019-01-30 DIAGNOSIS — M059 Rheumatoid arthritis with rheumatoid factor, unspecified: Secondary | ICD-10-CM | POA: Diagnosis not present

## 2019-01-30 DIAGNOSIS — Z1159 Encounter for screening for other viral diseases: Secondary | ICD-10-CM | POA: Diagnosis not present

## 2019-01-30 DIAGNOSIS — M791 Myalgia, unspecified site: Secondary | ICD-10-CM | POA: Diagnosis not present

## 2019-01-30 DIAGNOSIS — E559 Vitamin D deficiency, unspecified: Secondary | ICD-10-CM | POA: Diagnosis not present

## 2019-01-30 DIAGNOSIS — R0602 Shortness of breath: Secondary | ICD-10-CM | POA: Diagnosis not present

## 2019-01-30 DIAGNOSIS — R1032 Left lower quadrant pain: Secondary | ICD-10-CM | POA: Diagnosis not present

## 2019-01-30 DIAGNOSIS — R600 Localized edema: Secondary | ICD-10-CM | POA: Diagnosis not present

## 2019-01-30 DIAGNOSIS — C801 Malignant (primary) neoplasm, unspecified: Secondary | ICD-10-CM | POA: Diagnosis not present

## 2019-01-30 DIAGNOSIS — J8410 Pulmonary fibrosis, unspecified: Secondary | ICD-10-CM | POA: Diagnosis not present

## 2019-01-30 DIAGNOSIS — I10 Essential (primary) hypertension: Secondary | ICD-10-CM | POA: Diagnosis not present

## 2019-01-30 DIAGNOSIS — R634 Abnormal weight loss: Secondary | ICD-10-CM | POA: Diagnosis not present

## 2019-02-01 DIAGNOSIS — R1032 Left lower quadrant pain: Secondary | ICD-10-CM | POA: Diagnosis not present

## 2019-02-01 DIAGNOSIS — R109 Unspecified abdominal pain: Secondary | ICD-10-CM | POA: Diagnosis not present

## 2019-02-06 DIAGNOSIS — K769 Liver disease, unspecified: Secondary | ICD-10-CM | POA: Diagnosis not present

## 2019-02-06 DIAGNOSIS — E559 Vitamin D deficiency, unspecified: Secondary | ICD-10-CM | POA: Diagnosis not present

## 2019-02-06 DIAGNOSIS — D539 Nutritional anemia, unspecified: Secondary | ICD-10-CM | POA: Diagnosis not present

## 2019-02-06 DIAGNOSIS — E538 Deficiency of other specified B group vitamins: Secondary | ICD-10-CM | POA: Diagnosis not present

## 2019-02-06 DIAGNOSIS — R634 Abnormal weight loss: Secondary | ICD-10-CM | POA: Diagnosis not present

## 2019-02-06 DIAGNOSIS — R918 Other nonspecific abnormal finding of lung field: Secondary | ICD-10-CM | POA: Diagnosis not present

## 2019-02-06 DIAGNOSIS — R109 Unspecified abdominal pain: Secondary | ICD-10-CM | POA: Diagnosis not present

## 2019-02-06 DIAGNOSIS — R935 Abnormal findings on diagnostic imaging of other abdominal regions, including retroperitoneum: Secondary | ICD-10-CM | POA: Diagnosis not present

## 2019-02-11 ENCOUNTER — Ambulatory Visit (INDEPENDENT_AMBULATORY_CARE_PROVIDER_SITE_OTHER): Payer: Medicare Other | Admitting: Internal Medicine

## 2019-02-11 ENCOUNTER — Ambulatory Visit: Payer: Self-pay

## 2019-02-11 ENCOUNTER — Encounter: Payer: Self-pay | Admitting: Internal Medicine

## 2019-02-11 DIAGNOSIS — I25119 Atherosclerotic heart disease of native coronary artery with unspecified angina pectoris: Secondary | ICD-10-CM

## 2019-02-11 DIAGNOSIS — R519 Headache, unspecified: Secondary | ICD-10-CM | POA: Insufficient documentation

## 2019-02-11 DIAGNOSIS — G44209 Tension-type headache, unspecified, not intractable: Secondary | ICD-10-CM | POA: Diagnosis not present

## 2019-02-11 MED ORDER — SILVER SULFADIAZINE 1 % EX CREA: 1.0000 "application " | TOPICAL_CREAM | Freq: Every day | CUTANEOUS | 0 refills | Status: DC

## 2019-02-11 NOTE — Telephone Encounter (Signed)
Pt called stating that on Thursday she was possibly bit by a bug.  She is not sure iif it was a bug. She did notice a large knot on her forehead  She states that Friday it was swollen and the size of a nickle. She squeezed at it and clear sticky substance came out. Since that time the are has become extremely tender to the touch and her head hurts.  She rates the pain at 5. She denies any itching. She denies fever. Care advice read to patient. Patient verbalized understanding. Call was transferred to office for scheduling.  Reason for Disposition . [1] Red or very tender (to touch) area AND [2] started over 24 hours after the bite  Answer Assessment - Initial Assessment Questions 1. TYPE of INSECT: "What type of insect was it?"      unsure 2. ONSET: "When did you get bitten?"      First noticed lump thursday 3. LOCATION: "Where is the insect bite located?"      forhead 4. REDNESS: "Is the area red or pink?" If so, ask "What size is area of redness?" (inches or cm). "When did the redness start?"     Red less than nickel size 5. PAIN: "Is there any pain?" If so, ask: "How bad is it?"  (Scale 1-10; or mild, moderate, severe)     5 sore 6. ITCHING: "Does it itch?" If so, ask: "How bad is the itch?"    - MILD: doesn't interfere with normal activities   - MODERATE-SEVERE: interferes with work, school, sleep, or other activities     no 7. SWELLING: "How big is the swelling?" (inches, cm, or compare to coins)    pencle eracer or dime 8. OTHER SYMPTOMS: "Do you have any other symptoms?"  (e.g., difficulty breathing, hives)     no 9. PREGNANCY: "Is there any chance you are pregnant?" "When was your last menstrual period?"     N/A  Protocols used: INSECT BITE-A-AH

## 2019-02-11 NOTE — Progress Notes (Signed)
Virtual Visit via Video Note  I connected with [name] [name] on 02/11/19 at  3:40 PM EDT by a video enabled telemedicine application and verified that I am speaking with the correct person using two identifiers.  The patient and the provider were at separate locations throughout the entire encounter.   I discussed the limitations of evaluation and management by telemedicine and the availability of in person appointments. The patient expressed understanding and agreed to proceed.  History of Present Illness: The patient is a 85 y.o. female with visit for pain in head and bump. She noticed this last week and tried to pop it. Clear thick liquid came out. Started hurting after that. She denies it filling back up. Some red around the area and swelling. Denies taking anything for the headache. Denies fevers or chills. Has not used anything except alcohol on the rash area. Denies fevers or chills. Overall it is stable but not improving.   Observations/Objective: Appearance: normal, head with a lump on the middle forehead, minimal redness at the base of the area, denies pain when palpating temporal area, pain with palpation of the lesion, breathing appears normal, casual grooming, abdomen does not appear distended, throat normal, memory normal, mental status is A and O times 3  Assessment and Plan: See problem oriented charting  Follow Up Instructions: rx silvadene cream and use tylenol for headache  I discussed the assessment and treatment plan with the patient. The patient was provided an opportunity to ask questions and all were answered. The patient agreed with the plan and demonstrated an understanding of the instructions.   The patient was advised to call back or seek an in-person evaluation if the symptoms worsen or if the condition fails to improve as anticipated.  Hoyt Koch, MD

## 2019-02-11 NOTE — Assessment & Plan Note (Signed)
Advised tylenol for headache. Rx silvadene cream for the lesion and this should heal. Call back if not improved.

## 2019-02-11 NOTE — Telephone Encounter (Signed)
Visit with Sharlet Salina today

## 2019-02-13 DIAGNOSIS — N390 Urinary tract infection, site not specified: Secondary | ICD-10-CM | POA: Diagnosis not present

## 2019-02-13 DIAGNOSIS — E559 Vitamin D deficiency, unspecified: Secondary | ICD-10-CM | POA: Diagnosis not present

## 2019-02-13 DIAGNOSIS — R634 Abnormal weight loss: Secondary | ICD-10-CM | POA: Diagnosis not present

## 2019-02-13 DIAGNOSIS — R300 Dysuria: Secondary | ICD-10-CM | POA: Diagnosis not present

## 2019-02-13 DIAGNOSIS — R935 Abnormal findings on diagnostic imaging of other abdominal regions, including retroperitoneum: Secondary | ICD-10-CM | POA: Diagnosis not present

## 2019-02-13 DIAGNOSIS — E538 Deficiency of other specified B group vitamins: Secondary | ICD-10-CM | POA: Diagnosis not present

## 2019-02-20 DIAGNOSIS — E538 Deficiency of other specified B group vitamins: Secondary | ICD-10-CM | POA: Diagnosis not present

## 2019-02-20 DIAGNOSIS — E559 Vitamin D deficiency, unspecified: Secondary | ICD-10-CM | POA: Diagnosis not present

## 2019-02-20 DIAGNOSIS — I10 Essential (primary) hypertension: Secondary | ICD-10-CM | POA: Diagnosis not present

## 2019-02-20 DIAGNOSIS — E041 Nontoxic single thyroid nodule: Secondary | ICD-10-CM | POA: Diagnosis not present

## 2019-03-08 DIAGNOSIS — E041 Nontoxic single thyroid nodule: Secondary | ICD-10-CM | POA: Diagnosis not present

## 2019-03-08 DIAGNOSIS — E538 Deficiency of other specified B group vitamins: Secondary | ICD-10-CM | POA: Diagnosis not present

## 2019-03-15 DIAGNOSIS — E559 Vitamin D deficiency, unspecified: Secondary | ICD-10-CM | POA: Diagnosis not present

## 2019-03-15 DIAGNOSIS — R634 Abnormal weight loss: Secondary | ICD-10-CM | POA: Diagnosis not present

## 2019-03-15 DIAGNOSIS — E041 Nontoxic single thyroid nodule: Secondary | ICD-10-CM | POA: Diagnosis not present

## 2019-03-21 DIAGNOSIS — M0579 Rheumatoid arthritis with rheumatoid factor of multiple sites without organ or systems involvement: Secondary | ICD-10-CM | POA: Diagnosis not present

## 2019-03-21 DIAGNOSIS — Z79899 Other long term (current) drug therapy: Secondary | ICD-10-CM | POA: Diagnosis not present

## 2019-03-26 DIAGNOSIS — L409 Psoriasis, unspecified: Secondary | ICD-10-CM | POA: Diagnosis not present

## 2019-03-26 DIAGNOSIS — J849 Interstitial pulmonary disease, unspecified: Secondary | ICD-10-CM | POA: Diagnosis not present

## 2019-03-26 DIAGNOSIS — Z79899 Other long term (current) drug therapy: Secondary | ICD-10-CM | POA: Diagnosis not present

## 2019-03-26 DIAGNOSIS — M0579 Rheumatoid arthritis with rheumatoid factor of multiple sites without organ or systems involvement: Secondary | ICD-10-CM | POA: Diagnosis not present

## 2019-03-26 DIAGNOSIS — M2550 Pain in unspecified joint: Secondary | ICD-10-CM | POA: Diagnosis not present

## 2019-03-29 DIAGNOSIS — Z23 Encounter for immunization: Secondary | ICD-10-CM | POA: Diagnosis not present

## 2019-04-10 DIAGNOSIS — M79676 Pain in unspecified toe(s): Secondary | ICD-10-CM | POA: Diagnosis not present

## 2019-04-10 DIAGNOSIS — E559 Vitamin D deficiency, unspecified: Secondary | ICD-10-CM | POA: Diagnosis not present

## 2019-04-10 DIAGNOSIS — E538 Deficiency of other specified B group vitamins: Secondary | ICD-10-CM | POA: Diagnosis not present

## 2019-04-10 DIAGNOSIS — J8410 Pulmonary fibrosis, unspecified: Secondary | ICD-10-CM | POA: Diagnosis not present

## 2019-04-16 ENCOUNTER — Other Ambulatory Visit: Payer: Medicare Other

## 2019-04-17 ENCOUNTER — Other Ambulatory Visit: Payer: Medicare Other | Admitting: *Deleted

## 2019-04-17 ENCOUNTER — Other Ambulatory Visit: Payer: Self-pay

## 2019-04-17 DIAGNOSIS — E785 Hyperlipidemia, unspecified: Secondary | ICD-10-CM | POA: Diagnosis not present

## 2019-04-18 LAB — LIPID PANEL
Chol/HDL Ratio: 3.2 ratio (ref 0.0–4.4)
Cholesterol, Total: 181 mg/dL (ref 100–199)
HDL: 57 mg/dL (ref >39)
LDL Chol Calc (NIH): 102 mg/dL — ABNORMAL HIGH (ref 0–99)
Triglycerides: 126 mg/dL (ref 0–149)
VLDL Cholesterol Cal: 22 mg/dL (ref 5–40)

## 2019-04-18 LAB — HEPATIC FUNCTION PANEL
ALT: 12 IU/L (ref 0–32)
AST: 18 IU/L (ref 0–40)
Albumin: 4 g/dL (ref 3.6–4.6)
Alkaline Phosphatase: 80 IU/L (ref 39–117)
Bilirubin Total: 0.8 mg/dL (ref 0.0–1.2)
Bilirubin, Direct: 0.29 mg/dL (ref 0.00–0.40)
Total Protein: 6.7 g/dL (ref 6.0–8.5)

## 2019-04-19 ENCOUNTER — Telehealth: Payer: Self-pay | Admitting: *Deleted

## 2019-04-19 DIAGNOSIS — E7849 Other hyperlipidemia: Secondary | ICD-10-CM

## 2019-04-19 MED ORDER — ATORVASTATIN CALCIUM 40 MG PO TABS: 40.0000 mg | ORAL_TABLET | Freq: Every day | ORAL | 3 refills | Status: DC

## 2019-04-19 NOTE — Telephone Encounter (Signed)
-----   Message from Burnell Blanks, MD sent at 04/18/2019  9:53 AM EDT ----- LDL still not at goal of 70 with increase in Lipitor from 10 mg to 20 mg in July 2020. Can we have her increase her Lipitor to 40 mg daily and repeat lipids and LFTs in 12 weeks? Thanks, chris

## 2019-04-19 NOTE — Telephone Encounter (Signed)
I spoke with pt and reviewed lab results and recommendations from Dr. Angelena Form with her.  Will send new prescription to [facility].  She will come to office for fasting lab work on December 29,2020 at 10:00

## 2019-04-22 ENCOUNTER — Telehealth: Payer: Self-pay | Admitting: Acute Care

## 2019-04-22 NOTE — Telephone Encounter (Signed)
Patient is a former Dr. Lake Bells patient but has not seen a new provider yet.  Patient reports increased shortness of breath. Patient stated she was so short of breath the increased her oxygen from 2L to 3L to help catch breath. Patient stated she is feeling sluggish/lethargic.  Patient denied cough and fever. Patient stated she has had loss of smell for years and that has not changed.  Scheduled patient for telephone visit with NP. Nothing further needed at this time.

## 2019-04-22 NOTE — Telephone Encounter (Signed)
Attempted to call patient, no answer, no voicemail. Will try to call back.

## 2019-04-23 ENCOUNTER — Encounter: Payer: Self-pay | Admitting: Pulmonary Disease

## 2019-04-23 ENCOUNTER — Ambulatory Visit (INDEPENDENT_AMBULATORY_CARE_PROVIDER_SITE_OTHER): Payer: Medicare Other | Admitting: Pulmonary Disease

## 2019-04-23 DIAGNOSIS — Z5181 Encounter for therapeutic drug level monitoring: Secondary | ICD-10-CM

## 2019-04-23 DIAGNOSIS — Z86718 Personal history of other venous thrombosis and embolism: Secondary | ICD-10-CM | POA: Diagnosis not present

## 2019-04-23 DIAGNOSIS — J9611 Chronic respiratory failure with hypoxia: Secondary | ICD-10-CM

## 2019-04-23 DIAGNOSIS — M069 Rheumatoid arthritis, unspecified: Secondary | ICD-10-CM | POA: Diagnosis not present

## 2019-04-23 DIAGNOSIS — Z86711 Personal history of pulmonary embolism: Secondary | ICD-10-CM | POA: Diagnosis not present

## 2019-04-23 DIAGNOSIS — J8410 Pulmonary fibrosis, unspecified: Secondary | ICD-10-CM | POA: Diagnosis not present

## 2019-04-23 NOTE — Progress Notes (Signed)
Reviewed, agree 

## 2019-04-23 NOTE — Patient Instructions (Signed)
You were seen today by Lauraine Rinne, NP  for:   1. Postinflammatory pulmonary fibrosis / RA ILD   Continue CellCept  Continue dapsone  Continue infliximab  Continue follow-up with rheumatology  Continue oxygen therapy as prescribed  Contact our office sooner if you have any sort of worsened shortness of breath or worsening symptoms.  We are more than happy to see you in office or complete a tele-visit if your symptoms or not improving.  2. Chronic respiratory failure with hypoxia (HCC)  Continue oxygen therapy as prescribed - 2 to 3L  >>>maintain oxygen saturations greater than 88 percent  >>>if unable to maintain oxygen saturations please contact the office  >>>do not smoke with oxygen  >>>can use nasal saline gel or nasal saline rinses to moisturize nose if oxygen causes dryness  Contact our office if you are unable to maintain oxygen saturations above 90%  3. Rheumatoid arthritis involving multiple sites, unspecified whether rheumatoid factor present [facility])  Continue follow-up with rheumatology Dr. Trudie Reed Continue infliximab Continue CellCept Continue dapsone  4. Personal history of DVT (deep vein thrombosis)  Notify our office if you start to have worsening leg pain or unilateral swelling  Use Lovenox if traveling internationally  5. Personal history of PE (pulmonary embolism)  Notify our office if you have worsened shortness of breath accompanied with chest pain, or heart racing  Notify us of your oxygen levels are dropping below 90%  Use Lovenox if traveling internationally  6. Therapeutic drug monitoring  We likely will need to do baseline blood work on you when you come into our office over the next 4 weeks   Follow Up:    Return in about 4 weeks (around 05/21/2019), or if symptoms worsen or fail to improve, for Follow up with Dr. Vaughan Browner - 36min ov , With walk in office.   Please do your part to reduce the spread of COVID-19:      Reduce your risk  of any infection  and COVID19 by using the similar precautions used for avoiding the common cold or flu:  [name] Wash your hands often with soap and warm water for at least 20 seconds.  If soap and water are not readily available, use an alcohol-based hand sanitizer with at least 60% alcohol.  . If coughing or sneezing, cover your mouth and nose by coughing or sneezing into the elbow areas of your shirt or coat, into a tissue or into your sleeve (not your hands). Langley Gauss A MASK when in public  . Avoid shaking hands with others and consider head nods or verbal greetings only. . Avoid touching your eyes, nose, or mouth with unwashed hands.  . Avoid close contact with people who are sick. . Avoid places or events with large numbers of people in one location, like concerts or sporting events. . If you have some symptoms but not all symptoms, continue to monitor at home and seek medical attention if your symptoms worsen. . If you are having a medical emergency, call 911.   [place] / e-Visit: [email or web address]         [facility]: [facility]: [id number]                   [facility]: [id number]     It is flu season:   >>> Best ways to protect herself from the flu: Receive the yearly flu vaccine,  practice good hand hygiene washing with soap and also using hand sanitizer when available, eat a nutritious meals, get adequate rest, hydrate appropriately   Please contact the office if your symptoms worsen or you have concerns that you are not improving.   Thank you for choosing [facility] for your healthcare, and for allowing [place] to partner with you on your healthcare journey. I am thankful to be able to provide care to you today.   Wyn Quaker FNP-C

## 2019-04-23 NOTE — Assessment & Plan Note (Signed)
Plan: Continue to monitor oxygen saturations at home Maintain oxygen saturations above 90% Continue oxygen therapy at 2 L, can increase to 3 L if oxygen saturations are dropping below 90% Notify our office if oxygen levels are unable to be maintained above 90% We will get patient scheduled to follow-up with our office and establish care with Dr. Vaughan Browner with a walk

## 2019-04-23 NOTE — Assessment & Plan Note (Signed)
Unable to tolerate chronic anticoagulation History of for pulmonary embolisms in the past Last VQ scan in 2018 was indeterminate due to patient's ILD/COPD  Plan: Continue to monitor symptoms of shortness of breath Notify our office if you have any worsening shortness of breath, heart racing Continue to use Lovenox if you go on international travel

## 2019-04-23 NOTE — Assessment & Plan Note (Signed)
Plan: Continue CellCept Continue infliximab managed by Dr. Trudie Reed Continue follow-up with rheumatology

## 2019-04-23 NOTE — Progress Notes (Signed)
Virtual Visit via Telephone Note  I connected with [name] [name] on 04/23/19 at 11:00 AM EDT by telephone and verified that I am speaking with the correct person using two identifiers.  Location: Patient: Home Provider: [facility] - [id number], [place], [place], [place] [address]   I discussed the limitations, risks, security and privacy concerns of performing an evaluation and management service by telephone and the availability of in person appointments. I also discussed with the patient that there may be a patient responsible charge related to this service. The patient expressed understanding and agreed to proceed.  Patient consented to consult via telephone: Yes People present and their role in pt care: Pt   History of Present Illness:  85 year old female followed in our office for RA ILD and chronic respiratory failure.  Patient's pulmonary fibrosis was first seen on high-resolution CT chest in 2003.  She was evaluated at [facility] as well as [facility].  No biopsy was done.  2003 high-resolution CT chest did show subpleural honeycombing.  Had a negative autoimmune panel performed in 2003.  2012 due to autoimmune panel showed elevated rheumatoid factor, positive ANA but only with a titer of 1:40, sed rate 31.  CT changes in 2012 consistent with UIP.  Evaluated by rheumatology in 2013 and not felt to have rheumatoid arthritis.  Follow-up evaluation with Dr. Trudie Reed in [place] with worsened pulmonary function testing as well as progression on CT exams patient was started on therapy for management of rheumatoid arthritis as she likely had RA ILD.  Past medical history: Rheumatoid arthritis (infliximab managed by Dr. Trudie Reed rheumatology), history of DVT, history of PE (fourth PE in 2014 after returning from trip to [place]), psoriasis Smoking history: Former Smoker. Quit 1975. 10 pack year history.  Maintenance: CellCept Patient of Dr. Lake Bells  Rheumatology:  Dr. Trudie Reed >>>previous use of Leflunomide, Imuran Cardiology: Dr. Angelena Form  Patient was profoundly intolerant of Imuran in the past causing severe nausea and vomiting.  Difficult to chronic fully anticoagulate due to recurrent falls.  Anticoagulation was stopped in January/2018 because of recurrent falls.  Per Dr. Anastasia Pall January/2018 office note patient has prescription for Lovenox for her to use when she does international travel.   Chief complaint: Shortness of breath   85 year old female former smoker followed in our office for RA ILD, chronic respiratory failure.  Former patient of Dr. Lake Bells.  Has previously seen [place] NP for multiple office visits.  Last seen by Dr. Lake Bells in July/2019.  Patient also with significant history for history of multiple DVTs, multiple pulmonary embolisms, last pulmonary embolism was in 2014.  Patient does not have any chronic anticoagulation as patient was having recurrent falls and this was stopped in 2018.  Thoughts were that patient's DVTs as well as pulmonary embolisms were directly related to international travel.  She has a prescription for Lovenox to use if she does engage in international travel.  Patient continues to have regular follow-up with cardiology Dr. Angelena Form, rheumatology Dr. Trudie Reed.  She reports that her next follow-up with rheumatology is likely in December.  She continues to be maintained on Remicade.  She reports adherence to CellCept as well as dapsone.  Patient contacted our office because she had worsened shortness of breath on 04/20/2019.  She also reports that on 04/21/2019 she had worsening shortness of breath.  Oxygen levels were satting around 90 to 92% patient reports that at that time she was symptomatic and felt more fatigued.  She did increase her oxygen levels from 2 L to 3 L.  She felt that this helped with her symptoms.  Patient transitioned her oxygen levels back down to 2 L consistently after a few hours.  She reports that her  oxygen saturations today are 95%.  She denies any worsening cough or congestion.  She does still have her chronic leg swelling.  She does not feel that she is having a DVT or a pulmonary embolism that she has had many others in the past.  She denies unilateral leg swelling or leg pain.  She does have occasional chest pain she has had a significant cardiology work-up in the past.  She believes it is directly related to acid reflux.  She reports that Pepcid helps with her chest pain.  Patient wanted to contact her office to see if we would make any changes with her oxygen levels.  She also wanted to notify us that she had this episode this weekend.  Observations/Objective:  04/20/2019 - Sp02 - 90-92 (on 2L) 04/22/2019 - Spo2 - 95 (on 2L)   01/18/2011- connective tissue/autoimmune work-up Anti-nuclear antibody screen-positive Rheumatoid factor 116 Neutrophil cytoplasmic AB screen-negative Anti-SM negative Anti-RNP negative Anti-Ro negative Anti-LA negative Anti-SCL 70-negative Anti-Jo 1-negative Anti-DNA double-stranded-4 CCP antibody-0.8 Anti-Jo 1 AB-negative MI-2 test negative PL-7-negative PL-12-negative EJ-negative OJ-negative SRP-negative KU-negative PM/SLC-negative U2 SNRNP-negative Creatine kinase-66 Sed rate-31 Aldolase-8.1 C-reactive protein-0.2  10/26/2017-CT chest high-res- pulmonary parenchymal pattern fibrosis consistent with UIP, stable from 12/01/2015 but markedly progressive from baseline exam of 12/12/2006  06/18/2018-CTA chest-no PE or acute abnormality, stable changes of COPD and interstitial fibrosis with honeycombing, mild increase in size of mildly enlarged right hilar lymph node most likely reactive, calcified coronary artery disease and aortic arthrosclerosis, colonic diverticulosis  July 2017 pulmonary function testing FVC 1.81 L 90% predicted, total lung capacity 3.79 L 72% predicted, DLCO 8.29 32% predicted. April 2018 forced vital capacity 1.81 L 90%  predicted, DLCO 8.29 mL 32% predicted August 2018 FVC 1.88 L 95% predicted, DLCO 7.14 mL 27% predicted April 2019 FVC 1.67 L 86% predicted, DLCO 6.17 mL 24% predicted  Echo 02/03/17: - Left ventricle: The cavity size was normal. Systolic function was normal. The estimated ejection fraction was in the range of 60% to 65%. Wall motion was normal; there were no regional wall motion abnormalities. There was an increased relative contribution of atrial contraction to ventricular filling. Doppler parameters are consistent with abnormal left ventricular relaxation (grade 1 diastolic dysfunction). - Aortic valve: Trileaflet; normal thickness, mildly calcified leaflets.  12/27/2018-echocardiogram- normal heart function, LV ejection fraction 60 to 65%, moderate asymmetric left ventricular hypertrophy, RV has normal systolic function, no increase in right ventricular wall thickness, valves are grossly normal with the exception of the tricuspid valve which is out abnormal, tricuspid valve regurgitation  Cardiac cath 06/18/13: Left mainstem: Normal  Left anterior descending (LAD): Mild calcification. 30% disease in the mid vessel at the take off of the first diagonal.  Left circumflex (LCx): 30% disease in the mid vessel.  Right coronary artery (RCA): Mild calcification. Mild disease in the proximal and mid vessel to 20%.  Left ventriculography: Left ventricular systolic function is normal, LVEF is estimated at 55-65%, there is no significant mitral regurgitation   Assessment and Plan:  Personal history of DVT (deep vein thrombosis) Plan: Continue to follow-up with our office if you have any sort of lower extremity swelling or pain Continue to use Lovenox if you go on an international trip  Personal history of  PE (pulmonary embolism) Unable to tolerate chronic anticoagulation History of for pulmonary embolisms in the past Last VQ scan in 2018 was indeterminate due to patient's  ILD/COPD  Plan: Continue to monitor symptoms of shortness of breath Notify our office if you have any worsening shortness of breath, heart racing Continue to use Lovenox if you go on international travel  RA (rheumatoid arthritis) ([place]) Plan: Continue CellCept Continue infliximab managed by Dr. Trudie Reed Continue follow-up with rheumatology  Postinflammatory pulmonary fibrosis / RA ILD  Plan: Continue oxygen therapy as prescribed Continue CellCept Continue follow-up with rheumatology Continue infliximab as managed by rheumatology We will get you into our office to establish care with Dr. Vaughan Browner over the next 4 weeks with a 30-minute office visit Likely should consider repeating pulmonary function testing as last PFTs were in 2019  Chronic respiratory failure with hypoxia ([place]) Plan: Continue to monitor oxygen saturations at home Maintain oxygen saturations above 90% Continue oxygen therapy at 2 L, can increase to 3 L if oxygen saturations are dropping below 90% Notify our office if oxygen levels are unable to be maintained above 90% We will get patient scheduled to follow-up with our office and establish care with Dr. Vaughan Browner with a walk  Therapeutic drug monitoring Plan: Likely will need blood work at next office visit  Follow Up Instructions:  Return in about 4 weeks (around 05/21/2019), or if symptoms worsen or fail to improve, for Follow up with Dr. Vaughan Browner - 35min ov , With walk in office.   I discussed the assessment and treatment plan with the patient. The patient was provided an opportunity to ask questions and all were answered. The patient agreed with the plan and demonstrated an understanding of the instructions.   The patient was advised to call back or seek an in-person evaluation if the symptoms worsen or if the condition fails to improve as anticipated.  I provided 32 minutes of non-face-to-face time during this encounter.   Lauraine Rinne, NP

## 2019-04-23 NOTE — Assessment & Plan Note (Signed)
Plan: Likely will need blood work at next office visit

## 2019-04-23 NOTE — Assessment & Plan Note (Addendum)
Plan: Continue oxygen therapy as prescribed Continue CellCept Continue follow-up with rheumatology Continue infliximab as managed by rheumatology We will get you into our office to establish care with Dr. Vaughan Browner over the next 4 weeks with a 30-minute office visit Likely should consider repeating pulmonary function testing as last PFTs were in 2019

## 2019-04-23 NOTE — Assessment & Plan Note (Signed)
Plan: Continue to follow-up with our office if you have any sort of lower extremity swelling or pain Continue to use Lovenox if you go on an international trip

## 2019-04-25 ENCOUNTER — Telehealth: Payer: Self-pay

## 2019-04-25 NOTE — Telephone Encounter (Signed)
We can do that if she wants or she can try ensure 1-2 per day to see if this can help.

## 2019-04-25 NOTE — Telephone Encounter (Signed)
Copied from [place] [id number]. Topic: General - Other >> Apr 24, 2019  3:41 PM Alease Frame wrote: Call back number [id number] Reason for CRM:  Patient is requesting to speak with Nurse or Dr Sharlet Salina

## 2019-04-25 NOTE — Telephone Encounter (Signed)
Called patient and states that she has lost a lot of weight and she does not have an appetite. States patient's family is very concerned and wanted her to call and see if she can be prescribed something to increase appetite. Does state she is 115. I informed her that at her last visit she was 116 so that's not much different. Patient stated understanding but still has no appetite maybe eats once a day and her family says her close are fitting so loose on her and are worried.   Would you like a in office or telephone visit to discuss with patient?

## 2019-04-25 NOTE — Telephone Encounter (Signed)
Patient informed of MD response and stated understanding  

## 2019-05-08 ENCOUNTER — Other Ambulatory Visit: Payer: Self-pay | Admitting: Internal Medicine

## 2019-05-10 ENCOUNTER — Other Ambulatory Visit: Payer: Self-pay | Admitting: Nurse Practitioner

## 2019-05-10 DIAGNOSIS — J849 Interstitial pulmonary disease, unspecified: Secondary | ICD-10-CM

## 2019-05-24 ENCOUNTER — Other Ambulatory Visit: Payer: Self-pay

## 2019-05-24 ENCOUNTER — Encounter: Payer: Self-pay | Admitting: Pulmonary Disease

## 2019-05-24 ENCOUNTER — Ambulatory Visit (INDEPENDENT_AMBULATORY_CARE_PROVIDER_SITE_OTHER): Payer: Medicare Other | Admitting: Pulmonary Disease

## 2019-05-24 VITALS — BP 108/62 | HR 80 | Temp 97.1°F | Ht 64.5 in | Wt 114.6 lb

## 2019-05-24 DIAGNOSIS — J8410 Pulmonary fibrosis, unspecified: Secondary | ICD-10-CM

## 2019-05-24 DIAGNOSIS — L089 Local infection of the skin and subcutaneous tissue, unspecified: Secondary | ICD-10-CM | POA: Diagnosis not present

## 2019-05-24 DIAGNOSIS — I25119 Atherosclerotic heart disease of native coronary artery with unspecified angina pectoris: Secondary | ICD-10-CM

## 2019-05-24 MED ORDER — DOXYCYCLINE HYCLATE 100 MG PO TABS: 100.0000 mg | ORAL_TABLET | Freq: Two times a day (BID) | ORAL | 0 refills | Status: DC

## 2019-05-24 NOTE — Patient Instructions (Signed)
We will start you on an antibiotic called doxycycline 100 mg twice daily for 7 days Use Tylenol and over-the-counter Motrin for pain relief We will make an appointment with a podiatrist ASAP as I believe you have a nailbed infection and possible abscess.  We will schedule high-resolution CT, PFTs and 6-minute walk test in the next 1-2 months and follow up in clinic after that

## 2019-05-24 NOTE — Progress Notes (Signed)
[name] [name]    [id number]    03/18/1931  Primary Care Physician:Crawford, Real Cons, MD  Referring Physician: Hoyt Koch, MD [place],  [place] [id number]  Chief complaint: Follow-up for RA ILD  HPI: 85 year old with RA ILD, UIP fibrosis, chronic hypoxic respiratory failure on oxygen, DVT, PE Former patient of Dr. Lake Bells.  Pulmonary fibrosis diagnosed in 2003.    She was previously evaluated at [facility] and [facility].  Being managed by Dr. Trudie Reed for rheumatoid arthritis She was previously on infliximab, leflunomide and Imuran, which she did not tolerate due to severe GI symptoms.  Changed to CellCept in later part of 2019 Also has history of recurrent PE, DVT.  She has been difficult antiplatelet due to frequent falls.  Anticoagulation was stopped in January 2018 and she gets Lovenox to use when she does long distance travel.  Smoking history: Former smoker.  Quit in 1975.  10-pack-year smoking history  Interim history: States that her breathing is stable with no issues.  Continues on CellCept and dapsone  Chief complaint today is right toe pain.  She has noticed discoloration of right first toe nail.  She used vinegar wrap on the advice of her friend yesterday.  This is caused a lot of pain overnight with increased swelling of the toe with erythema.  Outpatient Encounter Medications as of 05/24/2019  Medication Sig  . ALPRAZolam (XANAX) 0.25 MG tablet Take 1 tablet (0.25 mg total) by mouth at bedtime as needed for anxiety.  [name] aspirin EC 81 MG tablet Take 1 tablet (81 mg total) by mouth daily.  [name] atorvastatin (LIPITOR) 40 MG tablet Take 1 tablet (40 mg total) by mouth daily.  . dapsone 100 MG tablet TAKE 1 TABLET(100 MG) BY MOUTH DAILY  . fluticasone (FLONASE) 50 MCG/ACT nasal spray Place 2 sprays into both nostrils daily as needed for allergies or rhinitis.  . furosemide (LASIX) 20 MG tablet Take 1 tablet by mouth daily as  needed for swelling.  . hydrocortisone (ANUSOL-HC) 2.5 % rectal cream Place 1 application rectally 2 (two) times daily.  . InFLIXimab (REMICADE IV) Inject into the vein.  [name] latanoprost (XALATAN) 0.005 % ophthalmic solution Place 1 drop into both eyes at bedtime.   . levalbuterol (XOPENEX HFA) 45 MCG/ACT inhaler Inhale 2 puffs into the lungs every 6 (six) hours as needed for wheezing.  . metoprolol succinate (TOPROL XL) 25 MG 24 hr tablet Take 1 tablet (25 mg total) by mouth daily.  . mycophenolate (CELLCEPT) 500 MG tablet 2 tabs bid  . Polyethyl Glycol-Propyl Glycol (SYSTANE) 0.4-0.3 % SOLN Apply 1 drop to eye daily as needed (for dry eyes).  . polyethylene glycol powder (GLYCOLAX/MIRALAX) powder Take 17 g by mouth 2 (two) times daily as needed.  . predniSONE (DELTASONE) 5 MG tablet Take 5 mg by mouth daily with breakfast.  . silver sulfADIAZINE (SILVADENE) 1 % cream Apply 1 application topically daily.  [name] triamcinolone cream (KENALOG) 0.1 % Apply 1 application topically 2 (two) times daily.  . valACYclovir (VALTREX) 1000 MG tablet TAKE 1 TABLET TWICE A DAY  . nitroGLYCERIN (NITROSTAT) 0.4 MG SL tablet Place 1 tablet (0.4 mg total) under the tongue every 5 (five) minutes as needed for chest pain.  . [DISCONTINUED] predniSONE (DELTASONE) 10 MG tablet Take 0.5 tablets (5 mg total) by mouth daily with breakfast.   No facility-administered encounter medications on file as of 05/24/2019.  Allergies as of 05/24/2019 - Review Complete 05/24/2019  Allergen Reaction Noted  . Crestor [rosuvastatin] Other (See Comments) 03/23/2012  . Lactose intolerance (gi) Other (See Comments) 06/17/2013  . Penicillins Hives   . Imuran [azathioprine] Nausea And Vomiting 01/31/2018  . Niacin Hives     Past Medical History:  Diagnosis Date  . Allergic rhinitis   . Allergy   . CAD (coronary artery disease)    Mild CAD by cath 2008  . DJD (degenerative joint disease)    rheumatoid  . DVT (deep venous  thrombosis) ([place])   . Dyslipidemia   . History of echocardiogram    Echo 12/2018: EF 60-65, mod asymmetric LVH, Gr 1 DD  . History of nuclear stress test    Myoview 5/17:  EF 74%, normal perfusion, low risk // Myoview 12/2018:  EF 89, very mild ischemia in inferoapical wall; Low Risk  . History of pulmonary embolism   . Hyperlipidemia   . Insomnia   . Osteoporosis   . Psoriasis   . Pulmonary fibrosis ([place])   . Rheumatoid arthritis [facility])     Past Surgical History:  Procedure Laterality Date  . ABDOMINAL HYSTERECTOMY  1974  . APPENDECTOMY  1959  . CATARACT EXTRACTION    . LEFT HEART CATHETERIZATION WITH CORONARY ANGIOGRAM N/A 06/18/2013   Procedure: LEFT HEART CATHETERIZATION WITH CORONARY ANGIOGRAM;  Surgeon: Peter M Martinique, MD;  Location: [facility] CATH LAB;  Service: Cardiovascular;  Laterality: N/A;  . TONSILLECTOMY  1941, 1951  . TOTAL KNEE ARTHROPLASTY  1997, 2007   Bilateral    Family History  Problem Relation Age of Onset  . Kidney disease Daughter 5  . Cancer Daughter   . Heart attack Father 90  . Alzheimer's disease Sister   . Heart disease Sister   . Alzheimer's disease Sister     Social History   Socioeconomic History  . Marital status: Widowed    Spouse name: Not on file  . Number of children: 2  . Years of education: Not on file  . Highest education level: Not on file  Occupational History  . 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 Financial resource strain: Not hard at all  . Food insecurity    Worry: Never true    Inability: Never true  . Transportation needs    Medical: No    Non-medical: No  Tobacco Use  . Smoking status: Former Smoker    Packs/day: 0.50    Years: 20.00    Pack years: 10.00    Types: Cigarettes    Quit date: 07/11/1973    Years since quitting: 45.8  . Smokeless tobacco: Never Used  Substance and Sexual Activity  . Alcohol use: No  . Drug use: No  . Sexual activity: Never  Lifestyle  .  Physical activity    Days per week: 0 days    Minutes per session: 0 min  . Stress: Only a little  Relationships  . Social connections    Talks on phone: More than three times a week    Gets together: More than three times a week    Attends religious service: More than 4 times per year    Active member of club or organization: Yes    Attends meetings of clubs or organizations: More than 4 times per year    Relationship status: Widowed  . Intimate partner violence    Fear of current or ex partner: Not on  file    Emotionally abused: Not on file    Physically abused: Not on file    Forced sexual activity: Not on file  Other Topics Concern  . Not on file  Social History Narrative   Diet:   Do you drink/eat things with caffeine? Yes   Marital status:        Widowed                      What year were you married?1954   Do you live in a house, apartment, assisted living, condo, trailer, etc)? House   Is it one or more stories? 1 1/4   How many persons live in your home?1   Do you have any pets in your home? No   Current or past profession:  Publishing rights manager   Do you exercise?        Yes                                            Type & how often:  Walk                                    Do you have a living will? Yes   Do you have a DNR Form? Yes   Do you have a POA/HPOA forms?  Yes    Review of systems: Review of Systems  Constitutional: Negative for fever and chills.  HENT: Negative.   Eyes: Negative for blurred vision.  Respiratory: as per HPI  Cardiovascular: Negative for chest pain and palpitations.  Gastrointestinal: Negative for vomiting, diarrhea, blood per rectum. Genitourinary: Negative for dysuria, urgency, frequency and hematuria.  Musculoskeletal: Negative for myalgias, back pain and joint pain.  Skin: Negative for itching and rash.  Neurological: Negative for dizziness, tremors, focal weakness, seizures and loss of consciousness.   Endo/Heme/Allergies: Negative for environmental allergies.  Psychiatric/Behavioral: Negative for depression, suicidal ideas and hallucinations.  All other systems reviewed and are negative.  Physical Exam: Blood pressure 108/62, pulse 80, temperature (!) 97.1 F (36.2 C), temperature source Temporal, height 5' 4.5" (1.638 m), weight 114 lb 9.6 oz (52 kg), SpO2 97 %. Gen:      No acute distress HEENT:  EOMI, sclera anicteric Neck:     No masses; no thyromegaly Lungs:    Clear to auscultation bilaterally; normal respiratory effort CV:         Regular rate and rhythm; no murmurs Abd:      + bowel sounds; soft, non-tender; no palpable masses, no distension Ext:    Right toe nail discolored, erythematous with fluctuance over the nailbed and toe Skin:      Warm and dry; no rash Neuro: alert and oriented x 3 Psych: normal mood and affect  Data Reviewed: Imaging: High-res CT 10/26/2017-UIP pattern pulmonary fibrosis.  Stable compared to 2017.  I have reviewed the images personally.  PFTs: 10/13/2017 FVC 1.67 [86%], FEV1 1.36 [89%], F/F 81, TLC 3.64 [70%], DLCO 6.17 [24%] Mild restriction and severe diffusion defect  6 min walk July 2017 6 minute walk distance 324 m, O2 saturation nadir 93% on room air  Assessment:  Right toe paronychia Suspect she may have an underlying abscess.  We will get her a referral to podiatry as soon  as possible. If her symptoms worsen before she can get a clinic visit then go to the emergency room Start doxycycline 100 mg twice daily Advised her to use Tylenol or motrin over-the-counter for pain.  RA ILD with UIP fibrosis CT scan reviewed with UIP fibrosis.  This appears stable on CT scan and PFTs dating back to 2014. But with clear progression compared to 2009. Continue CellCept. She is on dapsone for pneumocystis prophylaxis for unclear reason.  She had been on Bactrim in the past We will continue the same for now  Reassess with high-res CT, PFTs and  6-minute walk test. Based on these results we may consider Ofev  History of DVT, PE Not on chronic anticoagulation due to concern for fall She uses Lovenox while traveling.   Plan/Recommendations: - Doxycycline 100 twice daily for paronychia - Over-the-counter pain medication - Podiatry consult - Continue CellCept, dapsone - Follow-up high-res CT, PFTs and 6-minute walk test  Marshell Garfinkel MD [facility] 05/24/2019, 1:58 PM  CC: Hoyt Koch, *

## 2019-05-30 ENCOUNTER — Other Ambulatory Visit: Payer: Self-pay

## 2019-05-30 ENCOUNTER — Ambulatory Visit (HOSPITAL_COMMUNITY)
Admission: RE | Admit: 2019-05-30 | Discharge: 2019-05-30 | Disposition: A | Discharge status: Home / Self Care | Payer: Medicare Other | Source: Ambulatory Visit | Attending: Pulmonary Disease | Admitting: Pulmonary Disease

## 2019-05-30 DIAGNOSIS — J8410 Pulmonary fibrosis, unspecified: Secondary | ICD-10-CM | POA: Insufficient documentation

## 2019-06-03 ENCOUNTER — Other Ambulatory Visit: Payer: Self-pay

## 2019-06-03 ENCOUNTER — Encounter: Payer: Self-pay | Admitting: Podiatry

## 2019-06-03 ENCOUNTER — Ambulatory Visit (INDEPENDENT_AMBULATORY_CARE_PROVIDER_SITE_OTHER): Payer: Medicare Other | Admitting: Podiatry

## 2019-06-03 DIAGNOSIS — B351 Tinea unguium: Secondary | ICD-10-CM

## 2019-06-03 DIAGNOSIS — M79675 Pain in left toe(s): Secondary | ICD-10-CM | POA: Diagnosis not present

## 2019-06-03 DIAGNOSIS — L600 Ingrowing nail: Secondary | ICD-10-CM | POA: Diagnosis not present

## 2019-06-03 DIAGNOSIS — I25119 Atherosclerotic heart disease of native coronary artery with unspecified angina pectoris: Secondary | ICD-10-CM

## 2019-06-03 DIAGNOSIS — M79674 Pain in right toe(s): Secondary | ICD-10-CM | POA: Diagnosis not present

## 2019-06-03 NOTE — Patient Instructions (Signed)

## 2019-06-04 NOTE — Progress Notes (Signed)
Subjective:   Patient ID: [name] [name], female   DOB: 85 y.o.   MRN: [id number]   HPI Patient states the big toenail on the right is partially loose and its been bothering her and she probably had some kind of trauma to it but she is not sure what it may have been.  Patient does not smoke likes to be active and is having trouble being able to wear shoe on this right foot   Review of Systems  All other systems reviewed and are negative.       Objective:  Physical Exam Vitals signs and nursing note reviewed.  Constitutional:      Appearance: She is well-developed.  Pulmonary:     Effort: Pulmonary effort is normal.  Musculoskeletal: Normal range of motion.  Skin:    General: Skin is warm.  Neurological:     Mental Status: She is alert.     Neurovascular status intact muscle strength found to be within normal limits for her age with diminished range of motion subtalar midtarsal joint.  Patient is noted to have a damaged right hallux nail that is partially loose and there is no active drainage underneath it or proximal edema erythema drainage noted     Assessment:  Damaged hallux nail right that is partially loosened from the bed F2     Plan:  H&P reviewed condition and today I went ahead did proximal nerve block of the toe sterile prep applied and using sterile instrumentation remove the hallux nail that was partially loosened.  Cleaned up the nailbed did not know trauma to the underlying surface and applied sterile dressing gave instructions on soaks and reappoint as needed

## 2019-06-20 ENCOUNTER — Telehealth: Payer: Self-pay | Admitting: Pulmonary Disease

## 2019-06-20 NOTE — Telephone Encounter (Signed)
Attempted to call pt but unable to reach and unable to leave a VM. Will try to call back later. 

## 2019-06-20 NOTE — Telephone Encounter (Signed)
Patient calling back. She will await CB tomorrow. [id number]

## 2019-06-20 NOTE — Telephone Encounter (Signed)
Spoke with the pt  She states that she has been on pred 5 mg for several years, per Dr Lake Bells  She states that it was not discussed at last visit with Dr Oneita Jolly, whether or not to continue on this  Plan/Recommendations: - Doxycycline 100 twice daily for paronychia - Over-the-counter pain medication - Podiatry consult - Continue CellCept, dapsone - Follow-up high-res CT, PFTs and 6-minute walk test   Please advise thanks

## 2019-06-25 MED ORDER — PREDNISONE 5 MG PO TABS: 5.0000 mg | ORAL_TABLET | Freq: Every day | ORAL | 5 refills | Status: DC

## 2019-06-25 NOTE — Telephone Encounter (Signed)
Yes. Continue prednisone at 5 mg for now. Thanks

## 2019-06-25 NOTE — Telephone Encounter (Signed)
Pt aware of Dr. Matilde Bash recs.  Refill sent as requested.  Nothing further needed at this time- will close encounter.

## 2019-07-09 ENCOUNTER — Other Ambulatory Visit: Payer: Self-pay

## 2019-07-09 ENCOUNTER — Other Ambulatory Visit: Payer: Medicare Other | Admitting: *Deleted

## 2019-07-09 DIAGNOSIS — E7849 Other hyperlipidemia: Secondary | ICD-10-CM

## 2019-07-11 LAB — LIPID PANEL
Chol/HDL Ratio: 2.8 ratio (ref 0.0–4.4)
Cholesterol, Total: 160 mg/dL (ref 100–199)
HDL: 58 mg/dL (ref >39)
LDL Chol Calc (NIH): 77 mg/dL (ref 0–99)
Triglycerides: 145 mg/dL (ref 0–149)
VLDL Cholesterol Cal: 25 mg/dL (ref 5–40)

## 2019-07-11 LAB — HEPATIC FUNCTION PANEL
ALT: 15 IU/L (ref 0–32)
AST: 26 IU/L (ref 0–40)
Albumin: 4.1 g/dL (ref 3.6–4.6)
Alkaline Phosphatase: 96 IU/L (ref 39–117)
Bilirubin Total: 0.6 mg/dL (ref 0.0–1.2)
Bilirubin, Direct: 0.22 mg/dL (ref 0.00–0.40)
Total Protein: 7.1 g/dL (ref 6.0–8.5)

## 2019-07-15 ENCOUNTER — Telehealth: Payer: Self-pay

## 2019-07-15 NOTE — Telephone Encounter (Signed)
Would recommend follow up visit, can be virtual

## 2019-07-15 NOTE — Telephone Encounter (Signed)
FYI  Pt called stated that at her last OV weight loss was discuss. She wanted PCP to know that she is now down to 104lb.

## 2019-07-15 NOTE — Telephone Encounter (Signed)
Copied from [place] [id number]. Topic: General - Inquiry >> Jul 15, 2019 12:06 PM Alease Frame wrote: Reason for CRM: Patient is wanting a call back from Dr Charlynne Cousins nurse. Please advise

## 2019-07-15 NOTE — Telephone Encounter (Signed)
Pt scheduled  

## 2019-07-18 ENCOUNTER — Ambulatory Visit (INDEPENDENT_AMBULATORY_CARE_PROVIDER_SITE_OTHER): Payer: Medicare Other | Admitting: Internal Medicine

## 2019-07-18 ENCOUNTER — Encounter: Payer: Self-pay | Admitting: Internal Medicine

## 2019-07-18 DIAGNOSIS — R634 Abnormal weight loss: Secondary | ICD-10-CM | POA: Insufficient documentation

## 2019-07-18 MED ORDER — MIRTAZAPINE 15 MG PO TABS: 15.0000 mg | ORAL_TABLET | Freq: Every day | ORAL | 3 refills | Status: DC

## 2019-07-18 NOTE — Progress Notes (Signed)
Virtual Visit via Video Note  I connected with [name] [name] on 07/18/19 at 11:00 AM EST by a video enabled telemedicine application and verified that I am speaking with the correct person using two identifiers.  The patient and the provider were at separate locations throughout the entire encounter.   I discussed the limitations of evaluation and management by telemedicine and the availability of in person appointments. The patient expressed understanding and agreed to proceed. The patient and the provider were the only parties present for the visit unless noted in HPI below.  History of Present Illness: The patient is a 86 y.o. female with visit for loss of weight. Had loss of appetite and was down to 104 pounds. She got a lecture from her rheumatologist and started eating more. She had loss of appetite and had not been eating well. This has gotten her weight up to 112 pounds. Started in the last 1-2 years. Her rheumatologist thinks it is related to some medications which can take away her appetite. Has no new pain. No change in breathing. Denies chest pains or stomach problems. No change in bowel habits. Overall it is improving. Has tried nothing.  Observations/Objective: Appearance: normal, breathing appears normal, wearing oxygen, casual grooming, abdomen does not appear distended, throat normal, memory normal, mental status is A and O times 3  Assessment and Plan: See problem oriented charting  Follow Up Instructions: rx remeron, check in 1 month on weight and efficacy  I discussed the assessment and treatment plan with the patient. The patient was provided an opportunity to ask questions and all were answered. The patient agreed with the plan and demonstrated an understanding of the instructions.   The patient was advised to call back or seek an in-person evaluation if the symptoms worsen or if the condition fails to improve as anticipated.  Hoyt Koch, MD

## 2019-07-18 NOTE — Assessment & Plan Note (Signed)
New serious problem of unclear etiology. Could be medication induced or underlying condition. She is forcing herself to eat more but will start remeron for appetite boosting.

## 2019-07-19 ENCOUNTER — Inpatient Hospital Stay (HOSPITAL_COMMUNITY): Admission: RE | Admit: 2019-07-19 | Payer: Medicare Other | Source: Ambulatory Visit

## 2019-07-22 ENCOUNTER — Telehealth: Payer: Self-pay | Admitting: Internal Medicine

## 2019-07-22 NOTE — Telephone Encounter (Signed)
Pt called in to make her provider aware that she had the covid vaccine today. The number on it is: [id number]

## 2019-07-23 ENCOUNTER — Other Ambulatory Visit (HOSPITAL_COMMUNITY)
Admission: RE | Admit: 2019-07-23 | Discharge: 2019-07-23 | Disposition: A | Discharge status: Home / Self Care | Payer: Medicare Other | Source: Ambulatory Visit | Attending: Pulmonary Disease | Admitting: Pulmonary Disease

## 2019-07-23 ENCOUNTER — Telehealth: Payer: Self-pay | Admitting: *Deleted

## 2019-07-23 DIAGNOSIS — Z20822 Contact with and (suspected) exposure to covid-19: Secondary | ICD-10-CM | POA: Insufficient documentation

## 2019-07-23 DIAGNOSIS — Z01812 Encounter for preprocedural laboratory examination: Secondary | ICD-10-CM | POA: Insufficient documentation

## 2019-07-23 LAB — SARS CORONAVIRUS 2 (TAT 6-24 HRS): SARS Coronavirus 2: NEGATIVE

## 2019-07-23 NOTE — Telephone Encounter (Signed)
Spoke to pt & rescheduled her pft and covid test.  Nothing further needed.

## 2019-07-23 NOTE — Telephone Encounter (Signed)
Followed up with pt. She had the [place] vaccination on 07/22/2019. It has been entered in her chart.

## 2019-07-23 NOTE — Telephone Encounter (Signed)
Called pt and left message to call office back. Pt scheduled for PFT on tomorrow she missed covid testing appt. Pt needs to be rescheduled for both testing and change PFT to [facility] @11 .if still open.  Pt received her covid vaccine and wanted to know if it would interfere with covid testing.. Per Jules/Lauren it should not interfere.

## 2019-07-25 ENCOUNTER — Other Ambulatory Visit: Payer: Self-pay

## 2019-07-25 ENCOUNTER — Ambulatory Visit (INDEPENDENT_AMBULATORY_CARE_PROVIDER_SITE_OTHER): Payer: Medicare Other | Admitting: Pulmonary Disease

## 2019-07-25 DIAGNOSIS — J8410 Pulmonary fibrosis, unspecified: Secondary | ICD-10-CM | POA: Diagnosis not present

## 2019-07-25 LAB — PULMONARY FUNCTION TEST
DL/VA % pred: 53 %
DL/VA: 2.15 ml/min/mmHg/L
DLCO unc % pred: 30 %
DLCO unc: 5.83 ml/min/mmHg
FEF 25-75 Post: 1.22 L/sec
FEF 25-75 Pre: 1.71 L/sec
FEF2575-%Change-Post: -28 %
FEF2575-%Pred-Post: 105 %
FEF2575-%Pred-Pre: 147 %
FEV1-%Change-Post: -9 %
FEV1-%Pred-Post: 89 %
FEV1-%Pred-Pre: 98 %
FEV1-Post: 1.32 L
FEV1-Pre: 1.46 L
FEV1FVC-%Change-Post: -8 %
FEV1FVC-%Pred-Pre: 119 %
FEV6-%Change-Post: 0 %
FEV6-%Pred-Post: 89 %
FEV6-%Pred-Pre: 89 %
FEV6-Post: 1.63 L
FEV6-Pre: 1.63 L
FEV6FVC-%Pred-Post: 104 %
FEV6FVC-%Pred-Pre: 104 %
FVC-%Change-Post: 0 %
FVC-%Pred-Post: 85 %
FVC-%Pred-Pre: 86 %
FVC-Post: 1.63 L
FVC-Pre: 1.64 L
Post FEV1/FVC ratio: 81 %
Post FEV6/FVC ratio: 100 %
Pre FEV1/FVC ratio: 89 %
Pre FEV6/FVC Ratio: 100 %
RV % pred: 83 %
RV: 2.15 L
TLC % pred: 71 %
TLC: 3.74 L

## 2019-07-25 NOTE — Progress Notes (Signed)
Full PFT performed today. °

## 2019-07-26 ENCOUNTER — Ambulatory Visit: Payer: Medicare Other

## 2019-07-26 ENCOUNTER — Ambulatory Visit: Payer: Medicare Other | Admitting: Pulmonary Disease

## 2019-08-02 ENCOUNTER — Ambulatory Visit: Payer: Medicare Other

## 2019-08-09 DIAGNOSIS — E559 Vitamin D deficiency, unspecified: Secondary | ICD-10-CM | POA: Diagnosis not present

## 2019-08-09 DIAGNOSIS — R634 Abnormal weight loss: Secondary | ICD-10-CM | POA: Diagnosis not present

## 2019-08-09 DIAGNOSIS — R630 Anorexia: Secondary | ICD-10-CM | POA: Diagnosis not present

## 2019-08-09 DIAGNOSIS — E538 Deficiency of other specified B group vitamins: Secondary | ICD-10-CM | POA: Diagnosis not present

## 2019-08-22 DIAGNOSIS — R634 Abnormal weight loss: Secondary | ICD-10-CM | POA: Diagnosis not present

## 2019-08-22 DIAGNOSIS — R1310 Dysphagia, unspecified: Secondary | ICD-10-CM | POA: Diagnosis not present

## 2019-08-22 DIAGNOSIS — R070 Pain in throat: Secondary | ICD-10-CM | POA: Diagnosis not present

## 2019-08-26 ENCOUNTER — Other Ambulatory Visit: Payer: Self-pay | Admitting: Geriatric Medicine

## 2019-08-26 DIAGNOSIS — R1310 Dysphagia, unspecified: Secondary | ICD-10-CM

## 2019-08-26 DIAGNOSIS — R070 Pain in throat: Secondary | ICD-10-CM

## 2019-08-30 ENCOUNTER — Encounter: Payer: Self-pay | Admitting: Internal Medicine

## 2019-08-30 ENCOUNTER — Telehealth: Payer: Self-pay | Admitting: Adult Health

## 2019-08-30 ENCOUNTER — Ambulatory Visit: Payer: Medicare Other | Admitting: Internal Medicine

## 2019-08-30 DIAGNOSIS — Z0289 Encounter for other administrative examinations: Secondary | ICD-10-CM

## 2019-08-30 NOTE — Telephone Encounter (Signed)
Patient of Dr. Vaughan Browner with interstitial lung disease called in with complaints of back pain and soreness.  Complains of soreness along 2 areas on her spine.  Says it was hard for her to get out of bed this morning.  She has known rheumatoid arthritis. He denies any increased shortness of breath, cough or wheezing.  Says she has some aches and pains all over. She was worried that this might be related to her breathing. She denies any syncope, palpitations, chest pain, orthopnea or edema.  Have advised her to reach out to her rheumatologist or primary care provider as this could be related to her arthritis.  She may use Tylenol if not allergic as needed for pain.  May use heating pad on low for 30 minutes to back to see if this will help.  Advised her it is difficult to determine what is causing her back pain and most likely will need further evaluation.  Advised that if symptoms worsen or she develops other symptoms she is to see her here for this evaluation  Please contact office for sooner follow up if symptoms do not improve or worsen or seek emergency care

## 2019-08-30 NOTE — Progress Notes (Deleted)
Subjective:    Patient ID: [name] [name], female    DOB: 1931-02-09, 86 y.o.   MRN: [id number]  HPI The patient is here for an acute visit for back pain.     Medications and allergies reviewed with patient and updated if appropriate.  Patient Active Problem List   Diagnosis Date Noted  . Weight loss 07/18/2019  . Personal history of PE (pulmonary embolism) 04/23/2019  . Headache 02/11/2019  . Iron deficiency anemia 12/21/2018  . Cold sore 10/23/2018  . Blood in stool 09/14/2018  . Therapeutic drug monitoring 07/17/2018  . Chronic diastolic CHF (congestive heart failure) ([place]) 12/19/2017  . Rash 08/11/2017  . Chronic respiratory failure with hypoxia ([place]) 03/30/2017  . Angular cheilitis 03/08/2017  . Leg pain 10/25/2016  . Back pain 08/05/2016  . RA (rheumatoid arthritis) ([place]) 03/31/2015  . Allergic rhinitis   . Varicose vein 09/11/2014  . Chest pain 02/27/2012  . CAD (coronary artery disease) 02/27/2012  . Pruritus 08/18/2009  . Postinflammatory pulmonary fibrosis / RA ILD  06/30/2008  . Constipation 01/03/2008  . Dyslipidemia 06/16/2007  . Personal history of DVT (deep vein thrombosis) 06/16/2007    Current Outpatient Medications on File Prior to Visit  Medication Sig Dispense Refill  . ALPRAZolam (XANAX) 0.25 MG tablet Take 1 tablet (0.25 mg total) by mouth at bedtime as needed for anxiety. 15 tablet 0  . aspirin EC 81 MG tablet Take 1 tablet (81 mg total) by mouth daily.    [name] atorvastatin (LIPITOR) 40 MG tablet Take 1 tablet (40 mg total) by mouth daily. 90 tablet 3  . dapsone 100 MG tablet TAKE 1 TABLET(100 MG) BY MOUTH DAILY 90 tablet 0  . fluticasone (FLONASE) 50 MCG/ACT nasal spray Place 2 sprays into both nostrils daily as needed for allergies or rhinitis. 16 g 3  . furosemide (LASIX) 20 MG tablet Take 1 tablet by mouth daily as needed for swelling. 30 tablet 11  . hydrocortisone (ANUSOL-HC) 2.5 % rectal cream Place 1 application rectally 2 (two) times  daily. 30 g 0  . InFLIXimab (REMICADE IV) Inject into the vein.    [name] latanoprost (XALATAN) 0.005 % ophthalmic solution Place 1 drop into both eyes at bedtime.   0  . levalbuterol (XOPENEX HFA) 45 MCG/ACT inhaler Inhale 2 puffs into the lungs every 6 (six) hours as needed for wheezing. 1 Inhaler 12  . metoprolol succinate (TOPROL XL) 25 MG 24 hr tablet Take 1 tablet (25 mg total) by mouth daily. 30 tablet 0  . mirtazapine (REMERON) 15 MG tablet Take 1 tablet (15 mg total) by mouth at bedtime. 90 tablet 3  . mycophenolate (CELLCEPT) 500 MG tablet 2 tabs bid 360 tablet 3  . nitroGLYCERIN (NITROSTAT) 0.4 MG SL tablet Place 1 tablet (0.4 mg total) under the tongue every 5 (five) minutes as needed for chest pain. 25 tablet 3  . Polyethyl Glycol-Propyl Glycol (SYSTANE) 0.4-0.3 % SOLN Apply 1 drop to eye daily as needed (for dry eyes).    . polyethylene glycol powder (GLYCOLAX/MIRALAX) powder Take 17 g by mouth 2 (two) times daily as needed. 3350 g 1  . predniSONE (DELTASONE) 5 MG tablet Take 5 mg by mouth daily with breakfast.    . predniSONE (DELTASONE) 5 MG tablet Take 1 tablet (5 mg total) by mouth daily with breakfast. 30 tablet 5  . silver sulfADIAZINE (SILVADENE) 1 % cream Apply 1 application topically daily. 50 g 0  . triamcinolone cream (KENALOG) 0.1 %  Apply 1 application topically 2 (two) times daily. 453.6 g 0  . valACYclovir (VALTREX) 1000 MG tablet TAKE 1 TABLET TWICE A DAY 60 tablet 11   No current facility-administered medications on file prior to visit.    Past Medical History:  Diagnosis Date  . Allergic rhinitis   . Allergy   . CAD (coronary artery disease)    Mild CAD by cath 2008  . DJD (degenerative joint disease)    rheumatoid  . DVT (deep venous thrombosis) ([place])   . Dyslipidemia   . History of echocardiogram    Echo 12/2018: EF 60-65, mod asymmetric LVH, Gr 1 DD  . History of nuclear stress test    Myoview 5/17:  EF 74%, normal perfusion, low risk // Myoview 12/2018:  EF  89, very mild ischemia in inferoapical wall; Low Risk  . History of pulmonary embolism   . Hyperlipidemia   . Insomnia   . Osteoporosis   . Psoriasis   . Pulmonary fibrosis ([place])   . Rheumatoid arthritis [facility])     Past Surgical History:  Procedure Laterality Date  . ABDOMINAL HYSTERECTOMY  1974  . APPENDECTOMY  1959  . CATARACT EXTRACTION    . LEFT HEART CATHETERIZATION WITH CORONARY ANGIOGRAM N/A 06/18/2013   Procedure: LEFT HEART CATHETERIZATION WITH CORONARY ANGIOGRAM;  Surgeon: Peter M Martinique, MD;  Location: [facility] CATH LAB;  Service: Cardiovascular;  Laterality: N/A;  . TONSILLECTOMY  1941, 1951  . TOTAL KNEE ARTHROPLASTY  1997, 2007   Bilateral    Social History   Socioeconomic History  . Marital status: Widowed    Spouse name: Not on file  . Number of children: 2  . Years of education: Not on file  . Highest education level: Not on file  Occupational History  . Occupation: Publishing rights manager    Comment: retired  Tobacco Use  . Smoking status: Former Smoker    Packs/day: 0.50    Years: 20.00    Pack years: 10.00    Types: Cigarettes    Quit date: 07/11/1973    Years since quitting: 46.1  . Smokeless tobacco: Never Used  Substance and Sexual Activity  . Alcohol use: No  . Drug use: No  . Sexual activity: Never  Other Topics Concern  . Not on file  Social History Narrative   Diet:   Do you drink/eat things with caffeine? Yes   Marital status:        Widowed                      What year were you married?1954   Do you live in a house, apartment, assisted living, condo, trailer, etc)? House   Is it one or more stories? 1 1/4   How many persons live in your home?1   Do you have any pets in your home? No   Current or past profession:  Publishing rights manager   Do you exercise?        Yes                                            Type & how often:  Walk  Do you have a living will? Yes   Do you have a DNR Form?  Yes   Do you have a POA/HPOA forms?  Yes   Social Determinants of Health   Financial Resource Strain:   . Difficulty of Paying Living Expenses: Not on file  Food Insecurity:   . Worried About Charity fundraiser in the Last Year: Not on file  . Ran Out of Food in the Last Year: Not on file  Transportation Needs:   . Lack of Transportation (Medical): Not on file  . Lack of Transportation (Non-Medical): Not on file  Physical Activity:   . Days of Exercise per Week: Not on file  . Minutes of Exercise per Session: Not on file  Stress:   . Feeling of Stress : Not on file  Social Connections:   . Frequency of Communication with Friends and Family: Not on file  . Frequency of Social Gatherings with Friends and Family: Not on file  . Attends Religious Services: Not on file  . Active Member of Clubs or Organizations: Not on file  . Attends Archivist Meetings: Not on file  . Marital Status: Not on file    Family History  Problem Relation Age of Onset  . Kidney disease Daughter 5  . Cancer Daughter   . Heart attack Father 47  . Alzheimer's disease Sister   . Heart disease Sister   . Alzheimer's disease Sister     Review of Systems     Objective:  There were no vitals filed for this visit. BP Readings from Last 3 Encounters:  05/24/19 108/62  01/10/19 106/60  12/24/18 (!) 108/59   Wt Readings from Last 3 Encounters:  05/24/19 114 lb 9.6 oz (52 kg)  01/10/19 116 lb 12.8 oz (53 kg)  12/27/18 122 lb (55.3 kg)   There is no height or weight on file to calculate BMI.   Physical Exam         Assessment & Plan:    See Problem List for Assessment and Plan of chronic medical problems.    This visit occurred during the SARS-CoV-2 public health emergency.  Safety protocols were in place, including screening questions prior to the visit, additional usage of staff PPE, and extensive cleaning of exam room while observing appropriate contact time as indicated for  disinfecting solutions.

## 2019-09-02 ENCOUNTER — Other Ambulatory Visit (HOSPITAL_COMMUNITY): Payer: Self-pay | Admitting: Geriatric Medicine

## 2019-09-02 DIAGNOSIS — R070 Pain in throat: Secondary | ICD-10-CM

## 2019-09-03 ENCOUNTER — Other Ambulatory Visit: Payer: Medicare Other

## 2019-09-03 DIAGNOSIS — Z79899 Other long term (current) drug therapy: Secondary | ICD-10-CM | POA: Diagnosis not present

## 2019-09-03 DIAGNOSIS — M0579 Rheumatoid arthritis with rheumatoid factor of multiple sites without organ or systems involvement: Secondary | ICD-10-CM | POA: Diagnosis not present

## 2019-09-04 ENCOUNTER — Other Ambulatory Visit: Payer: Self-pay

## 2019-09-04 ENCOUNTER — Ambulatory Visit (HOSPITAL_COMMUNITY)
Admission: RE | Admit: 2019-09-04 | Discharge: 2019-09-04 | Disposition: A | Discharge status: Home / Self Care | Payer: Medicare Other | Source: Ambulatory Visit | Attending: Geriatric Medicine | Admitting: Geriatric Medicine

## 2019-09-04 DIAGNOSIS — R1310 Dysphagia, unspecified: Secondary | ICD-10-CM | POA: Insufficient documentation

## 2019-09-04 DIAGNOSIS — R070 Pain in throat: Secondary | ICD-10-CM | POA: Diagnosis not present

## 2019-09-04 DIAGNOSIS — K224 Dyskinesia of esophagus: Secondary | ICD-10-CM | POA: Diagnosis not present

## 2019-09-06 ENCOUNTER — Encounter: Payer: Self-pay | Admitting: Pulmonary Disease

## 2019-09-06 ENCOUNTER — Other Ambulatory Visit: Payer: Self-pay

## 2019-09-06 ENCOUNTER — Ambulatory Visit (INDEPENDENT_AMBULATORY_CARE_PROVIDER_SITE_OTHER): Payer: Medicare Other

## 2019-09-06 ENCOUNTER — Ambulatory Visit (INDEPENDENT_AMBULATORY_CARE_PROVIDER_SITE_OTHER): Payer: Medicare Other | Admitting: Pulmonary Disease

## 2019-09-06 VITALS — BP 122/60 | HR 100 | Temp 97.5°F | Wt 113.4 lb

## 2019-09-06 DIAGNOSIS — M26622 Arthralgia of left temporomandibular joint: Secondary | ICD-10-CM | POA: Diagnosis not present

## 2019-09-06 DIAGNOSIS — J3489 Other specified disorders of nose and nasal sinuses: Secondary | ICD-10-CM | POA: Diagnosis not present

## 2019-09-06 DIAGNOSIS — H9202 Otalgia, left ear: Secondary | ICD-10-CM | POA: Diagnosis not present

## 2019-09-06 DIAGNOSIS — J8410 Pulmonary fibrosis, unspecified: Secondary | ICD-10-CM | POA: Diagnosis not present

## 2019-09-06 DIAGNOSIS — M069 Rheumatoid arthritis, unspecified: Secondary | ICD-10-CM | POA: Diagnosis not present

## 2019-09-06 DIAGNOSIS — J9611 Chronic respiratory failure with hypoxia: Secondary | ICD-10-CM

## 2019-09-06 DIAGNOSIS — J849 Interstitial pulmonary disease, unspecified: Secondary | ICD-10-CM | POA: Diagnosis not present

## 2019-09-06 NOTE — Progress Notes (Signed)
[name] [name]    [id number]    04-17-1931  Primary Care Physician:Crawford, Real Cons, MD  Referring Physician: Hoyt Koch, MD [address] [place],  [place] [address]  Chief complaint: Follow-up for RA ILD  HPI: 86 year old with RA ILD, UIP fibrosis, chronic hypoxic respiratory failure on oxygen, DVT, PE Former patient of Dr. Lake Bells.  Pulmonary fibrosis diagnosed in 2003.    She was previously evaluated at [facility] and [facility].  Being managed by Dr. Trudie Reed for rheumatoid arthritis She was previously on infliximab, leflunomide and Imuran, which she did not tolerate due to severe GI symptoms.  Changed to CellCept in later part of 2019 Also has history of recurrent PE, DVT.  She has been difficult antiplatelet due to frequent falls.  Anticoagulation was stopped in January 2018 and she gets Lovenox to use when she does long distance travel.  Smoking history: Former smoker.  Quit in 1975.  10-pack-year smoking history  Interim history: States that her breathing is stable with no issues.  Continues on CellCept and dapsone  Chief complaint today is right toe pain.  She has noticed discoloration of right first toe nail.  She used vinegar wrap on the advice of her friend yesterday.  This is caused a lot of pain overnight with increased swelling of the toe with erythema.  Outpatient Encounter Medications as of 09/06/2019  Medication Sig  . ALPRAZolam (XANAX) 0.25 MG tablet Take 1 tablet (0.25 mg total) by mouth at bedtime as needed for anxiety.  [name] aspirin EC 81 MG tablet Take 1 tablet (81 mg total) by mouth daily.  [name] atorvastatin (LIPITOR) 40 MG tablet Take 1 tablet (40 mg total) by mouth daily.  . dapsone 100 MG tablet TAKE 1 TABLET(100 MG) BY MOUTH DAILY  . fluticasone (FLONASE) 50 MCG/ACT nasal spray Place 2 sprays into both nostrils daily as needed for allergies or rhinitis.  . furosemide (LASIX) 20 MG tablet Take 1 tablet by mouth daily as  needed for swelling.  . hydrocortisone (ANUSOL-HC) 2.5 % rectal cream Place 1 application rectally 2 (two) times daily.  . InFLIXimab (REMICADE IV) Inject into the vein.  [name] latanoprost (XALATAN) 0.005 % ophthalmic solution Place 1 drop into both eyes at bedtime.   . levalbuterol (XOPENEX HFA) 45 MCG/ACT inhaler Inhale 2 puffs into the lungs every 6 (six) hours as needed for wheezing.  . metoprolol succinate (TOPROL XL) 25 MG 24 hr tablet Take 1 tablet (25 mg total) by mouth daily.  . mirtazapine (REMERON) 15 MG tablet Take 1 tablet (15 mg total) by mouth at bedtime.  . mycophenolate (CELLCEPT) 500 MG tablet 2 tabs bid (Patient taking differently: 2 tabs in morning and 1 tab at bedtime)  . nitroGLYCERIN (NITROSTAT) 0.4 MG SL tablet Place 1 tablet (0.4 mg total) under the tongue every 5 (five) minutes as needed for chest pain.  [name] Glycol-Propyl Glycol (SYSTANE) 0.4-0.3 % SOLN Apply 1 drop to eye daily as needed (for dry eyes).  . polyethylene glycol powder (GLYCOLAX/MIRALAX) powder Take 17 g by mouth 2 (two) times daily as needed.  . predniSONE (DELTASONE) 5 MG tablet Take 1 tablet (5 mg total) by mouth daily with breakfast.  . silver sulfADIAZINE (SILVADENE) 1 % cream Apply 1 application topically daily.  [name] triamcinolone cream (KENALOG) 0.1 % Apply 1 application topically 2 (two) times daily.  . valACYclovir (VALTREX) 1000 MG tablet TAKE 1 TABLET TWICE A DAY (Patient  taking differently: Take 1,000 mg by mouth 2 (two) times daily as needed. )  . [DISCONTINUED] predniSONE (DELTASONE) 5 MG tablet Take 5 mg by mouth daily with breakfast.   No facility-administered encounter medications on file as of 09/06/2019.   Physical Exam: Blood pressure 122/60, pulse 100, temperature (!) 97.5 F (36.4 C), temperature source Temporal, weight 113 lb 6.4 oz (51.4 kg), SpO2 94 %. Gen:      No acute distress, frail HEENT:  EOMI, sclera anicteric Neck:     No masses; no thyromegaly Lungs:    Bibasal  crackles CV:         Regular rate and rhythm; no murmurs Abd:      + bowel sounds; soft, non-tender; no palpable masses, no distension Ext:    No edema; adequate peripheral perfusion Skin:      Warm and dry; no rash Neuro: alert and oriented x 3 Psych: normal mood and affect  Data Reviewed: Imaging: High-res CT 10/26/2017-UIP pattern pulmonary fibrosis.  Stable compared to 2017.   High-res CT 05/30/2019-UIP pattern fibrosis.  Stable.  I have reviewed the images personally.  PFTs: 10/13/2017 FVC 1.67 [86%], FEV1 1.36 [89%], F/F 81, TLC 3.64 [70%], DLCO 6.17 [24%] Mild restriction and severe diffusion defect  07/25/2019 FVC 1.63 [85%], FEV1 1.32 [89%], F/F 81, TLC 3.74 [71%), DLCO 5.83 [30%] Mild restriction, severe diffusion  6 min walk July 2017 6 minute walk distance 324 m, O2 saturation nadir 93% on room air 09/06/2019 -42 m, nadir O2 sat of 82%  Assessment:  RA ILD with UIP fibrosis CT scan reviewed with UIP fibrosis.   Continue CellCept. She is on dapsone for pneumocystis prophylaxis for unclear reason.  She had been on Bactrim in the past We will continue the same for now  Stable on CT scan dating back to 2014. But with clear progression compared to 2009. She is clearly worse in terms of symptoms, PFTs, 6-minute walk test Given progression she is a good candidate for Ofev.  We will start paperwork for this  History of DVT, PE Not on chronic anticoagulation due to concern for fall She uses Lovenox while traveling.   Plan/Recommendations: - Continue CellCept, dapsone - Start paperwork for Mertie Clause MD [facility] 09/06/2019, 12:15 PM  CC: Hoyt Koch, *

## 2019-09-06 NOTE — Patient Instructions (Signed)
We will get you started on a medication called Ofev as there is worsening of your lung function and pulmonary fibrosis We will refer you to pharmacy for education and management  Follow-up in 1 month

## 2019-09-06 NOTE — Progress Notes (Signed)
SIX MIN WALK 09/06/2019 01/15/2016 06/02/2015 04/29/2014 02/20/2014 11/14/2013 09/16/2013  Medications no meds taken prior to the walk. None Prednisone at 9am Pt has not taken any medication today.  pt states that she took "acid reflux pill"- was unsure on the name at approx 1:30 pm today - -  Supplimental Oxygen during Test? (L/min) Yes No No No No No No  O2 Flow Rate 2 - - - - - -  Type Pulse - - - - - -  Laps 7 6 6 6 7  - -  Partial Lap (in Meters) 0 36 39 36 25 - -  Baseline BP (sitting) 114/56 120/72 108/66 102/78 108/62 - -  Baseline Heartrate 105 99 89 99 84 - -  Baseline Dyspnea (Borg Scale) 7 0 1 0.5 0 - -  Baseline Fatigue (Borg Scale) 5 0 2 3 3  - -  Baseline SPO2 93 99 91 95 96 - -  BP (sitting) 140/62 132/86 124/70 118/74 112/64 - -  Heartrate 120 102 116 111 109 - -  Dyspnea (Borg Scale) 10 3 2 3 3  - -  Fatigue (Borg Scale) 5 0 2 3 3  - -  SPO2 82 93 90 87 88 - -  BP (sitting) 122/60 128/74 110/68 104/72 112/64 - -  Heartrate 113 88 94 97 88 - -  SPO2 90 96 96 96 97 - -  Stopped or Paused before Six Minutes No No No No No - -  Interpretation - Leg pain Angina;Dizziness (No Data) - - -  Distance Completed 238 324 327 324 361 - -  Tech Comments: Pt walked the entire 6 minutes at a slow pace staggering throughout the walk but did not have to pause any during the walk. - - - - Pt was SOB by end of 3rd lap but tolerated walk well. Pt did not complete due to SATS dropping to 87%

## 2019-09-09 ENCOUNTER — Other Ambulatory Visit: Payer: Self-pay | Admitting: Otolaryngology

## 2019-09-09 DIAGNOSIS — H9202 Otalgia, left ear: Secondary | ICD-10-CM

## 2019-09-12 ENCOUNTER — Telehealth: Payer: Self-pay | Admitting: Pharmacy Technician

## 2019-09-12 NOTE — Telephone Encounter (Signed)
Received notification from [place] regarding a prior authorization for [place]. Authorization has been APPROVED from 08/13/19 to 09/11/20.   Authorization # [id number]  Had to call Cost for Cost Exceeds Override. Approved through 09/10/2020.   Patient's copay for 1 month is $33.00  10:00 AM [name] [name], CPhT

## 2019-09-12 NOTE — Telephone Encounter (Signed)
Received Ofev 150mg  new start paperwork, will update as we work through the benefits process.  9:16 AM [name] [name], CPhT

## 2019-09-13 NOTE — Telephone Encounter (Signed)
Called patient to notify of approval.  Had new start appointment scheduled for 3/11 at 2:30 PM after her 2 PM appointment with Dr. Vaughan Browner.   Mariella Saa, PharmD, [facility], CPP Clinical Specialty Pharmacist [phone]  09/13/2019 11:11 AM

## 2019-09-13 NOTE — Progress Notes (Signed)
Subjective:  Patient presents today to [facility] to see pharmacy team for Ofev new start appointment.  Pertinent past medical history includes ILD associated with RA, CAD, CHF, allergic rhinitis, history of tobacco abuse and history of PE/DVT. Prior therapy includes Arava, Imuran, and is currently on Remicade, CellCept along with dapsone since 2019.  She is no longer on anticoagulant therapy as of January 2018.  Objective: Allergies  Allergen Reactions  . Crestor [Rosuvastatin] Other (See Comments)    Muscle pain  . Lactose Intolerance (Gi) Other (See Comments)    Stomach upset  . Penicillins Hives  . Imuran [Azathioprine] Nausea And Vomiting  . Niacin Hives    Outpatient Encounter Medications as of 09/19/2019  Medication Sig  . ALPRAZolam (XANAX) 0.25 MG tablet Take 1 tablet (0.25 mg total) by mouth at bedtime as needed for anxiety.  [name] aspirin EC 81 MG tablet Take 1 tablet (81 mg total) by mouth daily.  [name] atorvastatin (LIPITOR) 40 MG tablet Take 1 tablet (40 mg total) by mouth daily.  . dapsone 100 MG tablet TAKE 1 TABLET(100 MG) BY MOUTH DAILY  . fluticasone (FLONASE) 50 MCG/ACT nasal spray Place 2 sprays into both nostrils daily as needed for allergies or rhinitis.  . furosemide (LASIX) 20 MG tablet Take 1 tablet by mouth daily as needed for swelling.  . hydrocortisone (ANUSOL-HC) 2.5 % rectal cream Place 1 application rectally 2 (two) times daily.  . InFLIXimab (REMICADE IV) Inject into the vein.  [name] latanoprost (XALATAN) 0.005 % ophthalmic solution Place 1 drop into both eyes at bedtime.   . levalbuterol (XOPENEX HFA) 45 MCG/ACT inhaler Inhale 2 puffs into the lungs every 6 (six) hours as needed for wheezing.  . metoprolol succinate (TOPROL XL) 25 MG 24 hr tablet Take 1 tablet (25 mg total) by mouth daily.  . mirtazapine (REMERON) 15 MG tablet Take 1 tablet (15 mg total) by mouth at bedtime.  . mycophenolate (CELLCEPT) 500 MG tablet 2 tabs bid (Patient taking differently:  2 tabs in morning and 1 tab at bedtime)  . nitroGLYCERIN (NITROSTAT) 0.4 MG SL tablet Place 1 tablet (0.4 mg total) under the tongue every 5 (five) minutes as needed for chest pain.  [name] Glycol-Propyl Glycol (SYSTANE) 0.4-0.3 % SOLN Apply 1 drop to eye daily as needed (for dry eyes).  . polyethylene glycol powder (GLYCOLAX/MIRALAX) powder Take 17 g by mouth 2 (two) times daily as needed.  . predniSONE (DELTASONE) 5 MG tablet Take 1 tablet (5 mg total) by mouth daily with breakfast.  . silver sulfADIAZINE (SILVADENE) 1 % cream Apply 1 application topically daily.  [name] triamcinolone cream (KENALOG) 0.1 % Apply 1 application topically 2 (two) times daily.  . valACYclovir (VALTREX) 1000 MG tablet TAKE 1 TABLET TWICE A DAY (Patient taking differently: Take 1,000 mg by mouth 2 (two) times daily as needed. )   No facility-administered encounter medications on file as of 09/19/2019.     Immunization History  Administered Date(s) Administered  . Influenza Split 05/09/2011  . Influenza Whole 04/10/2010, 04/10/2012  . Influenza, High Dose Seasonal PF 05/07/2017, 04/23/2018, 03/24/2019  . Influenza,inj,Quad PF,6+ Mos 07/25/2013, 04/29/2014, 04/16/2015  . Influenza-Unspecified 04/24/2016  . Moderna SARS-COVID-2 Vaccination 07/22/2019  . Pneumococcal Conjugate-13 04/16/2015  . Pneumococcal Polysaccharide-23 07/26/2017  . Tdap 11/12/2013  . Zoster 07/11/2013     HRCT 10/26/2017-UIP pattern pulmonary fibrosis.  Stable compared to 2017.   PFT's TLC  Date Value Ref Range Status  07/25/2019 3.74 L Final  CMP     Component Value Date/Time   NA 141 12/12/2018 1607   K 4.2 12/12/2018 1607   CL 104 12/12/2018 1607   CO2 24 12/12/2018 1607   GLUCOSE 87 12/12/2018 1607   GLUCOSE 86 09/20/2018 1144   BUN 16 12/12/2018 1607   CREATININE 0.70 12/12/2018 1607   CREATININE 0.74 11/30/2015 1327   CALCIUM 9.3 12/12/2018 1607   PROT 7.1 07/09/2019 1050   ALBUMIN 4.1 07/09/2019 1050   AST 26  07/09/2019 1050   ALT 15 07/09/2019 1050   ALKPHOS 96 07/09/2019 1050   BILITOT 0.6 07/09/2019 1050   GFRNONAA 79 12/12/2018 1607   GFRAA 91 12/12/2018 1607     CBC    Component Value Date/Time   WBC 9.9 12/12/2018 1607   WBC 8.7 09/20/2018 1144   RBC 3.56 (L) 12/12/2018 1607   RBC 3.27 (L) 09/20/2018 1144   HGB 10.8 (L) 12/12/2018 1607   HCT 33.6 (L) 12/12/2018 1607   PLT 189 12/12/2018 1607   MCV 94 12/12/2018 1607   MCH 30.3 12/12/2018 1607   MCH 29.5 06/18/2018 1909   MCHC 32.1 12/12/2018 1607   MCHC 32.5 09/20/2018 1144   RDW 13.0 12/12/2018 1607   LYMPHSABS 3.6 09/20/2018 1144   LYMPHSABS 3.7 (H) 04/27/2018 1306   MONOABS 1.1 (H) 09/20/2018 1144   EOSABS 0.1 09/20/2018 1144   EOSABS 0.1 04/27/2018 1306   BASOSABS 0.0 09/20/2018 1144   BASOSABS 0.0 04/27/2018 1306     LFT's Hepatic Function Latest Ref Rng & Units 07/09/2019 04/17/2019 01/18/2019  Total Protein 6.0 - 8.5 g/dL 7.1 6.7 6.9  Albumin 3.6 - 4.6 g/dL 4.1 4.0 4.1  AST 0 - 40 IU/L '26 18 19  ' ALT 0 - 32 IU/L '15 12 10  ' Alk Phosphatase 39 - 117 IU/L 96 80 76  Total Bilirubin 0.0 - 1.2 mg/dL 0.6 0.8 1.0  Bilirubin, Direct 0.00 - 0.40 mg/dL 0.22 0.29 0.26     Assessment and Plan  1. Ofev Medication Management  Patient counseled on purpose, proper use, and potential adverse effects including diarrhea, nausea, vomiting, abdominal pain, decreased appetite, weight loss, and increased blood pressure. Stressed the importance of routine lab monitoring. Will monitor LFT's every month for the first 6 months of treatment then every 3 months. Will monitor CBC every 3 months.  Advised that she can have her rheumatologist fax over results in order to not duplicate labs.  Patient verbalized understanding.  Ofev dose will be 150 mg capsule every 12 hours with food. Stressed importance of taking with food to minimize stomach upset.  Ofev was approved through insurance and her co-pay will be $33 per 30 day supply.   Prescription sent to [facility].  Patient was given the phone number to [facility] to schedule her for shipment of Ofev.  She is to return in 1 month for a follow-up appointment and labs.  2. Medication Reconciliation  A drug regimen assessment was performed, including review of allergies, interactions, disease-state management, dosing and immunization history. Medications were reviewed with the patient, including name, instructions, indication, goals of therapy, potential side effects, importance of adherence, and safe use. No major drug interactions identified.  3. Immunizations  Patient is up-to-date with annual influenza and pneumonia vaccines.  She received Zostavax in 2015 and first COVID-19 vaccine 1/11/202 and she states she received 08/19/2019.  Recommend new shingles vaccine Shingrix.  All questions encouraged and answered. Instructed patient to call with any questions or concerns.

## 2019-09-18 ENCOUNTER — Ambulatory Visit: Payer: Medicare Other

## 2019-09-18 ENCOUNTER — Other Ambulatory Visit: Payer: Self-pay

## 2019-09-18 ENCOUNTER — Ambulatory Visit
Admission: RE | Admit: 2019-09-18 | Discharge: 2019-09-18 | Disposition: A | Discharge status: Home / Self Care | Payer: Medicare Other | Source: Ambulatory Visit | Attending: Otolaryngology | Admitting: Otolaryngology

## 2019-09-18 DIAGNOSIS — K222 Esophageal obstruction: Secondary | ICD-10-CM | POA: Diagnosis not present

## 2019-09-18 DIAGNOSIS — H9202 Otalgia, left ear: Secondary | ICD-10-CM

## 2019-09-18 MED ORDER — IOPAMIDOL (ISOVUE-300) INJECTION 61%
75.0000 mL | Freq: Once | INTRAVENOUS | Status: AC | PRN
  Administered 2019-09-18: 75 mL via INTRAVENOUS

## 2019-09-19 ENCOUNTER — Ambulatory Visit (INDEPENDENT_AMBULATORY_CARE_PROVIDER_SITE_OTHER): Payer: Medicare Other | Admitting: Pulmonary Disease

## 2019-09-19 ENCOUNTER — Ambulatory Visit (INDEPENDENT_AMBULATORY_CARE_PROVIDER_SITE_OTHER): Payer: Medicare Other | Admitting: Pharmacist

## 2019-09-19 ENCOUNTER — Encounter: Payer: Self-pay | Admitting: Pulmonary Disease

## 2019-09-19 VITALS — BP 100/60 | HR 88 | Temp 98.1°F | Ht 64.5 in | Wt 112.2 lb

## 2019-09-19 DIAGNOSIS — Z5181 Encounter for therapeutic drug level monitoring: Secondary | ICD-10-CM

## 2019-09-19 DIAGNOSIS — I270 Primary pulmonary hypertension: Secondary | ICD-10-CM | POA: Diagnosis not present

## 2019-09-19 DIAGNOSIS — J8417 Interstitial lung disease with progressive fibrotic phenotype in diseases classified elsewhere: Secondary | ICD-10-CM

## 2019-09-19 DIAGNOSIS — Z7189 Other specified counseling: Secondary | ICD-10-CM

## 2019-09-19 MED ORDER — OFEV 150 MG PO CAPS: 150.0000 mg | ORAL_CAPSULE | Freq: Two times a day (BID) | ORAL | 1 refills | Status: DC

## 2019-09-19 NOTE — Progress Notes (Signed)
[name] [name]    [id number]    1931/02/26  Primary Care Physician:Crawford, Real Cons, MD  Referring Physician: Hoyt Koch, MD [address] [place],  [place] [address]  Chief complaint: Follow-up for RA ILD  HPI: 86 year old with RA ILD, UIP fibrosis, chronic hypoxic respiratory failure on oxygen, DVT, PE Former patient of Dr. Lake Bells.  Pulmonary fibrosis diagnosed in 2003.    She was previously evaluated at [facility] and [facility].  Being managed by Dr. Trudie Reed for rheumatoid arthritis She was previously on infliximab, leflunomide and Imuran, which she did not tolerate due to severe GI symptoms.  Changed to CellCept in later part of 2019 Also has history of recurrent PE, DVT.  She has been difficult antiplatelet due to frequent falls.  Anticoagulation was stopped in January 2018 and she gets Lovenox to use when she does long distance travel.  Smoking history: Former smoker.  Quit in 1975.  10-pack-year smoking history  Interim history: States that her breathing is stable with no issues.  Continues on CellCept and dapsone Approved for Ofev and is waiting to start the new medication. Also complains of increasing lower extremity edema.  States that dyspnea on exertion is stable.  Recently evaluated by Dr. Valetta Mole, ENT for temporomandibular joint dysfunction and recommended treatment with heating pads, over-the-counter anti-inflammatories.  Outpatient Encounter Medications as of 09/19/2019  Medication Sig  . ALPRAZolam (XANAX) 0.25 MG tablet Take 1 tablet (0.25 mg total) by mouth at bedtime as needed for anxiety.  [name] aspirin EC 81 MG tablet Take 1 tablet (81 mg total) by mouth daily.  [name] atorvastatin (LIPITOR) 40 MG tablet Take 1 tablet (40 mg total) by mouth daily.  . dapsone 100 MG tablet TAKE 1 TABLET(100 MG) BY MOUTH DAILY  . fluticasone (FLONASE) 50 MCG/ACT nasal spray Place 2 sprays into both nostrils daily as needed for allergies or  rhinitis.  . furosemide (LASIX) 20 MG tablet Take 1 tablet by mouth daily as needed for swelling.  . hydrocortisone (ANUSOL-HC) 2.5 % rectal cream Place 1 application rectally 2 (two) times daily.  . InFLIXimab (REMICADE IV) Inject into the vein.  [name] latanoprost (XALATAN) 0.005 % ophthalmic solution Place 1 drop into both eyes at bedtime.   . levalbuterol (XOPENEX HFA) 45 MCG/ACT inhaler Inhale 2 puffs into the lungs every 6 (six) hours as needed for wheezing.  . metoprolol succinate (TOPROL XL) 25 MG 24 hr tablet Take 1 tablet (25 mg total) by mouth daily.  . mirtazapine (REMERON) 15 MG tablet Take 1 tablet (15 mg total) by mouth at bedtime.  . mycophenolate (CELLCEPT) 500 MG tablet 2 tabs bid (Patient taking differently: 2 tabs in morning and 1 tab at bedtime)  . nitroGLYCERIN (NITROSTAT) 0.4 MG SL tablet Place 1 tablet (0.4 mg total) under the tongue every 5 (five) minutes as needed for chest pain.  [name] Glycol-Propyl Glycol (SYSTANE) 0.4-0.3 % SOLN Apply 1 drop to eye daily as needed (for dry eyes).  . polyethylene glycol powder (GLYCOLAX/MIRALAX) powder Take 17 g by mouth 2 (two) times daily as needed.  . predniSONE (DELTASONE) 5 MG tablet Take 1 tablet (5 mg total) by mouth daily with breakfast.  . silver sulfADIAZINE (SILVADENE) 1 % cream Apply 1 application topically daily.  [name] triamcinolone cream (KENALOG) 0.1 % Apply 1 application topically 2 (two) times daily.  . valACYclovir (VALTREX) 1000 MG tablet TAKE 1 TABLET TWICE A DAY (Patient taking differently:  Take 1,000 mg by mouth 2 (two) times daily as needed. )   No facility-administered encounter medications on file as of 09/19/2019.   Physical Exam: Blood pressure 100/60, pulse 88, temperature 98.1 F (36.7 C), temperature source Temporal, height 5' 4.5" (1.638 m), weight 112 lb 3.2 oz (50.9 kg), SpO2 98 %. Gen:      No acute distress HEENT:  EOMI, sclera anicteric Neck:     No masses; no thyromegaly Lungs:    Clear to  auscultation bilaterally; normal respiratory effort CV:         Regular rate and rhythm; no murmurs Abd:      + bowel sounds; soft, non-tender; no palpable masses, no distension Ext:    2+ edema; adequate peripheral perfusion Skin:      Warm and dry; no rash Neuro: alert and oriented x 3 Psych: normal mood and affect  Data Reviewed: Imaging: High-res CT 10/26/2017-UIP pattern pulmonary fibrosis.  Stable compared to 2017.   High-res CT 05/30/2019-UIP pattern fibrosis.  Stable.  I have reviewed the images personally.  PFTs: 10/13/2017 FVC 1.67 [86%], FEV1 1.36 [89%], F/F 81, TLC 3.64 [70%], DLCO 6.17 [24%] Mild restriction and severe diffusion defect  07/25/2019 FVC 1.63 [85%], FEV1 1.32 [89%], F/F 81, TLC 3.74 [71%), DLCO 5.83 [30%] Mild restriction, severe diffusion  6 min walk July 2017 6 minute walk distance 324 m, O2 saturation nadir 93% on room air 09/06/2019 -81 m, nadir O2 sat of 82%  Assessment:  RA ILD with UIP fibrosis CT scan reviewed with UIP fibrosis.   Continue CellCept. She is on dapsone for pneumocystis prophylaxis for unclear reason.  She had been on Bactrim in the past We will continue the same for now  Stable on CT scan dating back to 2014. But with clear progression compared to 2009. She is clearly worse in terms of symptoms, PFTs, 6-minute walk test Given progression of symptoms we are initiating Ofev Pharmacy education today on new medication  Lower extremity edema Has history of diastolic heart failure Lasix which she uses intermittently.  I asked her to take her Lasix dose of 20 mg every day for the next 5 days Check echocardiogram to evaluate pulmonary hypertension.  History of DVT, PE Not on chronic anticoagulation due to concern for fall She uses Lovenox while traveling.   Plan/Recommendations: - Continue CellCept, dapsone - Start ofev - Lasix 20 mg/day - Echo  Marshell Garfinkel MD [facility] 09/19/2019, 3:10  PM  CC: Hoyt Koch, *

## 2019-09-19 NOTE — Patient Instructions (Signed)
Start taking the Lasix every day for swelling of the leg We will schedule an echocardiogram to evaluate your heart and for pulmonary hypertension Continue on the CellCept, dapsone We will start you on Ofev Pharmacy will educate you on this medication Follow-up in 1 month.

## 2019-10-03 DIAGNOSIS — D649 Anemia, unspecified: Secondary | ICD-10-CM | POA: Diagnosis not present

## 2019-10-07 ENCOUNTER — Ambulatory Visit: Payer: Medicare Other

## 2019-10-09 ENCOUNTER — Other Ambulatory Visit: Payer: Self-pay | Admitting: Nurse Practitioner

## 2019-10-09 DIAGNOSIS — J849 Interstitial pulmonary disease, unspecified: Secondary | ICD-10-CM

## 2019-10-28 ENCOUNTER — Other Ambulatory Visit (HOSPITAL_COMMUNITY): Payer: Medicare Other

## 2019-10-29 DIAGNOSIS — M0579 Rheumatoid arthritis with rheumatoid factor of multiple sites without organ or systems involvement: Secondary | ICD-10-CM | POA: Diagnosis not present

## 2019-10-31 ENCOUNTER — Ambulatory Visit: Payer: Medicare Other | Admitting: Podiatry

## 2019-11-04 ENCOUNTER — Encounter (HOSPITAL_COMMUNITY): Payer: Self-pay | Admitting: Pulmonary Disease

## 2019-11-04 ENCOUNTER — Ambulatory Visit (INDEPENDENT_AMBULATORY_CARE_PROVIDER_SITE_OTHER): Payer: Medicare Other | Admitting: Podiatry

## 2019-11-04 ENCOUNTER — Ambulatory Visit: Payer: Medicare Other | Admitting: Podiatry

## 2019-11-04 ENCOUNTER — Other Ambulatory Visit: Payer: Self-pay

## 2019-11-04 ENCOUNTER — Encounter: Payer: Self-pay | Admitting: Podiatry

## 2019-11-04 VITALS — Temp 97.6°F

## 2019-11-04 DIAGNOSIS — M79675 Pain in left toe(s): Secondary | ICD-10-CM

## 2019-11-04 DIAGNOSIS — M778 Other enthesopathies, not elsewhere classified: Secondary | ICD-10-CM

## 2019-11-04 DIAGNOSIS — M79674 Pain in right toe(s): Secondary | ICD-10-CM

## 2019-11-04 DIAGNOSIS — B351 Tinea unguium: Secondary | ICD-10-CM

## 2019-11-04 NOTE — Progress Notes (Signed)
Subjective:   Patient ID: [name] [name], female   DOB: 86 y.o.   MRN: [id number]   HPI Patient presents with thick yellow brittle nailbeds that she cannot cut 1-5 both feet and was concerned about the right big toenail   ROS      Objective:  Physical Exam  Neurovascular status intact thick yellow brittle nailbeds 1-5 both feet that are painful and crusted nail right hallux that healing well but wanted to be looked at      Assessment:  Mycotic nail infection 1-5 both feet with pain with crusted right hallux nail     Plan:  Debridement nailbeds 1-5 both feet with no iatrogenic bleeding and reappoint to recheck

## 2019-11-09 ENCOUNTER — Other Ambulatory Visit: Payer: Self-pay | Admitting: Pulmonary Disease

## 2019-11-09 DIAGNOSIS — J8417 Interstitial lung disease with progressive fibrotic phenotype in diseases classified elsewhere: Secondary | ICD-10-CM

## 2019-11-14 ENCOUNTER — Telehealth (HOSPITAL_COMMUNITY): Payer: Self-pay | Admitting: Pulmonary Disease

## 2019-11-14 NOTE — Telephone Encounter (Signed)
Just an FYI. We have made several attempts to contact this patient including sending a letter to schedule or reschedule their echocardiogram. We will be removing the patient from the echo WQ.  Mailed letter  11/04/19 [facility] to schedule @ 10:44/LBW  10/29/19 [facility] to schedule @ 1:53/LBW  10/17/19 [facility] to schedule @ 9:40/LBW  09/19/19 IN BASKET to [facility] for PA#/LBW 3:12     Thank you

## 2019-11-15 ENCOUNTER — Telehealth: Payer: Self-pay | Admitting: Pulmonary Disease

## 2019-11-15 MED ORDER — MYCOPHENOLATE MOFETIL 500 MG PO TABS: ORAL_TABLET | ORAL | 3 refills | Status: DC

## 2019-11-15 NOTE — Telephone Encounter (Signed)
Called and spoke with Patient. Patient stated she is needing a refill of mycophenolate.  Patient requested [facility] on [address]. Prescription sent. Nothing further at this time.

## 2019-11-23 ENCOUNTER — Encounter: Payer: Self-pay | Admitting: Internal Medicine

## 2019-12-03 ENCOUNTER — Other Ambulatory Visit: Payer: Self-pay | Admitting: *Deleted

## 2019-12-03 DIAGNOSIS — J849 Interstitial pulmonary disease, unspecified: Secondary | ICD-10-CM

## 2019-12-03 MED ORDER — DAPSONE 100 MG PO TABS: ORAL_TABLET | ORAL | 1 refills | Status: DC

## 2019-12-03 MED ORDER — PREDNISONE 5 MG PO TABS: 5.0000 mg | ORAL_TABLET | Freq: Every day | ORAL | 1 refills | Status: DC

## 2019-12-20 ENCOUNTER — Other Ambulatory Visit: Payer: Self-pay | Admitting: *Deleted

## 2019-12-24 DIAGNOSIS — Z79899 Other long term (current) drug therapy: Secondary | ICD-10-CM | POA: Diagnosis not present

## 2019-12-24 DIAGNOSIS — M0579 Rheumatoid arthritis with rheumatoid factor of multiple sites without organ or systems involvement: Secondary | ICD-10-CM | POA: Diagnosis not present

## 2019-12-28 ENCOUNTER — Emergency Department (HOSPITAL_COMMUNITY): Payer: Medicare Other

## 2019-12-28 ENCOUNTER — Other Ambulatory Visit: Payer: Self-pay

## 2019-12-28 ENCOUNTER — Emergency Department (HOSPITAL_COMMUNITY)
Admission: EM | Admit: 2019-12-28 | Discharge: 2019-12-28 | Disposition: A | Discharge status: Home / Self Care | Payer: Medicare Other | Attending: Emergency Medicine | Admitting: Emergency Medicine

## 2019-12-28 ENCOUNTER — Encounter (HOSPITAL_COMMUNITY): Payer: Self-pay | Admitting: *Deleted

## 2019-12-28 DIAGNOSIS — S0083XA Contusion of other part of head, initial encounter: Secondary | ICD-10-CM | POA: Insufficient documentation

## 2019-12-28 DIAGNOSIS — M25512 Pain in left shoulder: Secondary | ICD-10-CM | POA: Diagnosis not present

## 2019-12-28 DIAGNOSIS — Y9389 Activity, other specified: Secondary | ICD-10-CM | POA: Diagnosis not present

## 2019-12-28 DIAGNOSIS — Z87891 Personal history of nicotine dependence: Secondary | ICD-10-CM | POA: Diagnosis not present

## 2019-12-28 DIAGNOSIS — W01198A Fall on same level from slipping, tripping and stumbling with subsequent striking against other object, initial encounter: Secondary | ICD-10-CM | POA: Insufficient documentation

## 2019-12-28 DIAGNOSIS — Y999 Unspecified external cause status: Secondary | ICD-10-CM | POA: Diagnosis not present

## 2019-12-28 DIAGNOSIS — M545 Low back pain: Secondary | ICD-10-CM | POA: Diagnosis not present

## 2019-12-28 DIAGNOSIS — Z7982 Long term (current) use of aspirin: Secondary | ICD-10-CM | POA: Diagnosis not present

## 2019-12-28 DIAGNOSIS — Y9289 Other specified places as the place of occurrence of the external cause: Secondary | ICD-10-CM | POA: Insufficient documentation

## 2019-12-28 DIAGNOSIS — W19XXXA Unspecified fall, initial encounter: Secondary | ICD-10-CM

## 2019-12-28 DIAGNOSIS — Z79899 Other long term (current) drug therapy: Secondary | ICD-10-CM | POA: Diagnosis not present

## 2019-12-28 DIAGNOSIS — S0093XA Contusion of unspecified part of head, initial encounter: Secondary | ICD-10-CM

## 2019-12-28 DIAGNOSIS — M542 Cervicalgia: Secondary | ICD-10-CM | POA: Diagnosis present

## 2019-12-28 DIAGNOSIS — I2510 Atherosclerotic heart disease of native coronary artery without angina pectoris: Secondary | ICD-10-CM | POA: Insufficient documentation

## 2019-12-28 DIAGNOSIS — S0990XA Unspecified injury of head, initial encounter: Secondary | ICD-10-CM | POA: Diagnosis not present

## 2019-12-28 MED ORDER — ACETAMINOPHEN 325 MG PO TABS
650.0000 mg | ORAL_TABLET | Freq: Once | ORAL | Status: AC
  Administered 2019-12-28: 650 mg via ORAL
  Filled 2019-12-28: qty 2

## 2019-12-28 NOTE — ED Provider Notes (Signed)
[name] [name] DEPT Provider Note   CSN: [id number] Arrival date & time: 12/28/19  1051     History Chief Complaint  Patient presents with  . Fall    [name] [name] is a 86 y.o. female.  86 year old female presents with injuries from a mechanical fall, patient tripped over her oxygen tubing, falling forwards and hitting her right forehead on the floor. No loss of consciousness, not anticoagulated. Reports pain in her forehead and neck. Patient has been ambulatory since the fall. No other injuries or complaints.         Past Medical History:  Diagnosis Date  . Allergic rhinitis   . Allergy   . CAD (coronary artery disease)    Mild CAD by cath 2008  . DJD (degenerative joint disease)    rheumatoid  . DVT (deep venous thrombosis) ([place])   . Dyslipidemia   . History of echocardiogram    Echo 12/2018: EF 60-65, mod asymmetric LVH, Gr 1 DD  . History of nuclear stress test    Myoview 5/17:  EF 74%, normal perfusion, low risk // Myoview 12/2018:  EF 89, very mild ischemia in inferoapical wall; Low Risk  . History of pulmonary embolism   . Hyperlipidemia   . Insomnia   . Osteoporosis   . Psoriasis   . Pulmonary fibrosis ([place])   . Rheumatoid arthritis [facility])     Patient Active Problem List   Diagnosis Date Noted  . Weight loss 07/18/2019  . Personal history of PE (pulmonary embolism) 04/23/2019  . Headache 02/11/2019  . Iron deficiency anemia 12/21/2018  . Cold sore 10/23/2018  . Blood in stool 09/14/2018  . Therapeutic drug monitoring 07/17/2018  . Chronic diastolic CHF (congestive heart failure) ([place]) 12/19/2017  . Rash 08/11/2017  . Chronic respiratory failure with hypoxia ([place]) 03/30/2017  . Angular cheilitis 03/08/2017  . Leg pain 10/25/2016  . Back pain 08/05/2016  . RA (rheumatoid arthritis) ([place]) 03/31/2015  . Allergic rhinitis   . Varicose vein 09/11/2014  . Chest pain 02/27/2012  . CAD (coronary artery disease) 02/27/2012  .  Pruritus 08/18/2009  . Postinflammatory pulmonary fibrosis / RA ILD  06/30/2008  . Constipation 01/03/2008  . Dyslipidemia 06/16/2007  . Personal history of DVT (deep vein thrombosis) 06/16/2007    Past Surgical History:  Procedure Laterality Date  . ABDOMINAL HYSTERECTOMY  1974  . APPENDECTOMY  1959  . CATARACT EXTRACTION    . LEFT HEART CATHETERIZATION WITH CORONARY ANGIOGRAM N/A 06/18/2013   Procedure: LEFT HEART CATHETERIZATION WITH CORONARY ANGIOGRAM;  Surgeon: Peter M Martinique, MD;  Location: [facility] CATH LAB;  Service: Cardiovascular;  Laterality: N/A;  . TONSILLECTOMY  1941, 1951  . TOTAL KNEE ARTHROPLASTY  1997, 2007   Bilateral     OB History    Gravida  4   Para  2   Term  2   Preterm      AB      Living  0     SAB      TAB      Ectopic      Multiple      Live Births              Family History  Problem Relation Age of Onset  . Kidney disease Daughter 5  . Cancer Daughter   . Heart attack Father 70  . Alzheimer's disease Sister   . Heart disease Sister   . Alzheimer's disease Sister  Social History   Tobacco Use  . Smoking status: Former Smoker    Packs/day: 0.50    Years: 20.00    Pack years: 10.00    Types: Cigarettes    Quit date: 07/11/1973    Years since quitting: 46.4  . Smokeless tobacco: Never Used  Vaping Use  . Vaping Use: Never used  Substance Use Topics  . Alcohol use: No  . Drug use: No    Home Medications Prior to Admission medications   Medication Sig Start Date End Date Taking? Authorizing Provider  ALPRAZolam (XANAX) 0.25 MG tablet Take 1 tablet (0.25 mg total) by mouth at bedtime as needed for anxiety. 04/23/18   Hoyt Koch, MD  aspirin EC 81 MG tablet Take 1 tablet (81 mg total) by mouth daily. 11/30/15   Richardson Dopp T, PA-C  atorvastatin (LIPITOR) 40 MG tablet Take 1 tablet (40 mg total) by mouth daily. 04/19/19   Burnell Blanks, MD  dapsone 100 MG tablet TAKE 1 TABLET(100 MG) BY MOUTH DAILY  12/03/19   Mannam, Praveen, MD  fluticasone (FLONASE) 50 MCG/ACT nasal spray Place 2 sprays into both nostrils daily as needed for allergies or rhinitis. 03/17/15   Hoyt Koch, MD  furosemide (LASIX) 20 MG tablet Take 1 tablet by mouth daily as needed for swelling. 12/11/18   Burtis Junes, NP  hydrocortisone (ANUSOL-HC) 2.5 % rectal cream Place 1 application rectally 2 (two) times daily. 09/13/18   Hoyt Koch, MD  InFLIXimab (REMICADE IV) Inject into the vein.    [provider]  latanoprost (XALATAN) 0.005 % ophthalmic solution Place 1 drop into both eyes at bedtime.  09/06/14   [provider]  levalbuterol Penne Lash HFA) 45 MCG/ACT inhaler Inhale 2 puffs into the lungs every 6 (six) hours as needed for wheezing. 06/05/17   Magdalen Spatz, NP  metoprolol succinate (TOPROL XL) 25 MG 24 hr tablet Take 1 tablet (25 mg total) by mouth daily. 12/12/18   Richardson Dopp T, PA-C  mirtazapine (REMERON) 15 MG tablet Take 1 tablet (15 mg total) by mouth at bedtime. 07/18/19   Hoyt Koch, MD  mycophenolate (CELLCEPT) 500 MG tablet 2 tabs bid 11/15/19   Mannam, Hart Robinsons, MD  nitroGLYCERIN (NITROSTAT) 0.4 MG SL tablet Place 1 tablet (0.4 mg total) under the tongue every 5 (five) minutes as needed for chest pain. 12/12/18 09/19/19  Weaver, Scott T, PA-C  OFEV 150 MG CAPS TAKE 1 CAPSULE TWICE A DAY 11/11/19   Mannam, Praveen, MD  Polyethyl Glycol-Propyl Glycol (SYSTANE) 0.4-0.3 % SOLN Apply 1 drop to eye daily as needed (for dry eyes).    [provider]  polyethylene glycol powder (GLYCOLAX/MIRALAX) powder Take 17 g by mouth 2 (two) times daily as needed. 09/13/18   Hoyt Koch, MD  predniSONE (DELTASONE) 5 MG tablet Take 1 tablet (5 mg total) by mouth daily with breakfast. 12/03/19   Mannam, Hart Robinsons, MD  silver sulfADIAZINE (SILVADENE) 1 % cream Apply 1 application topically daily. 02/11/19   Hoyt Koch, MD  triamcinolone cream (KENALOG) 0.1 % Apply 1  application topically 2 (two) times daily. 04/23/18   Hoyt Koch, MD  valACYclovir (VALTREX) 1000 MG tablet TAKE 1 TABLET TWICE A DAY Patient taking differently: Take 1,000 mg by mouth 2 (two) times daily as needed.  05/09/19   Hoyt Koch, MD    Allergies    Crestor [rosuvastatin], Lactose intolerance (gi), Penicillins, Imuran [azathioprine], and Niacin  Review  of Systems   Review of Systems  Constitutional: Negative for fever.  Respiratory: Negative for shortness of breath.   Cardiovascular: Negative for chest pain.  Gastrointestinal: Negative for abdominal pain.  Musculoskeletal: Positive for arthralgias, back pain and neck pain.  Skin: Positive for wound.  Neurological: Negative for dizziness, weakness, light-headedness and headaches.  Hematological: Does not bruise/bleed easily.  Psychiatric/Behavioral: Negative for confusion.  All other systems reviewed and are negative.   Physical Exam Updated Vital Signs BP 119/71   Pulse 82   Temp 98.2 F (36.8 C)   Resp 16   Ht 5' 4.5" (1.638 m)   Wt 47.6 kg   SpO2 97%   BMI 17.74 kg/m   Physical Exam Vitals and nursing note reviewed.  Constitutional:      General: She is not in acute distress.    Appearance: She is well-developed. She is not diaphoretic.  HENT:     Head: Normocephalic.      Mouth/Throat:     Mouth: Mucous membranes are moist.  Eyes:     Extraocular Movements: Extraocular movements intact.     Pupils: Pupils are equal, round, and reactive to light.  Neck:     Comments: c-collar in place Cardiovascular:     Rate and Rhythm: Normal rate and regular rhythm.     Pulses: Normal pulses.     Heart sounds: Normal heart sounds.  Pulmonary:     Effort: Pulmonary effort is normal.     Breath sounds: Normal breath sounds.  Abdominal:     Palpations: Abdomen is soft.     Tenderness: There is no abdominal tenderness.  Musculoskeletal:        General: Tenderness present.     Right  shoulder: Normal.     Left shoulder: Tenderness present. No bony tenderness. Decreased range of motion. Normal strength. Normal pulse.     Right wrist: Normal.     Left wrist: Normal.       Arms:     Cervical back: Tenderness present. No bony tenderness.     Thoracic back: No tenderness or bony tenderness.     Lumbar back: Tenderness and bony tenderness present.       Back:     Right hip: Normal. No tenderness or bony tenderness. Normal range of motion.     Left hip: Normal. No tenderness or bony tenderness. Normal range of motion.     Right knee: Normal.     Left knee: Normal.     Right ankle: Normal.     Left ankle: Normal.     Right foot: Normal.     Left foot: Normal.     Comments: Tenderness in the left shoulder with ROM, more so with palpation of the left trapezius   Skin:    General: Skin is warm and dry.     Findings: No erythema or rash.  Neurological:     Mental Status: She is alert and oriented to person, place, and time.  Psychiatric:        Behavior: Behavior normal.     ED Results / Procedures / Treatments   Labs (all labs ordered are listed, but only abnormal results are displayed) Labs Reviewed - No data to display  EKG None  Radiology DG Lumbar Spine Complete  Result Date: 12/28/2019 CLINICAL DATA:  Low back pain secondary to a fall. EXAM: LUMBAR SPINE - COMPLETE 4+ VIEW COMPARISON:  CT scan of the abdomen and pelvis dated 09/28/2015 FINDINGS: There is  no fracture bone destruction. Lateral alignment is normal. Slight disc space narrowing at L4-5 and L5-S1, chronic. Slight left facet arthritis at L4-5 and L5-S1. Pulmonary fibrosis visible at the lung bases. Aortic atherosclerosis. IMPRESSION: 1. No acute abnormality of the lumbar spine. 2. Chronic degenerative disc and joint disease in the lower lumbar spine. 3.  Aortic Atherosclerosis (ICD10-I70.0). Electronically Signed   By: Lorriane Shire M.D.   On: 12/28/2019 13:49   CT Head Wo Contrast  Result Date:  12/28/2019 CLINICAL DATA:  Fall, left-sided neck pain, right forehead abrasion EXAM: CT HEAD WITHOUT CONTRAST CT CERVICAL SPINE WITHOUT CONTRAST TECHNIQUE: Multidetector CT imaging of the head and cervical spine was performed following the standard protocol without intravenous contrast. Multiplanar CT image reconstructions of the cervical spine were also generated. COMPARISON:  11/12/2013, 02/08/2018 FINDINGS: CT HEAD FINDINGS Brain: No evidence of acute infarction, hemorrhage, hydrocephalus, extra-axial collection or mass lesion/mass effect. Low-density changes within the periventricular and subcortical white matter compatible with chronic microvascular ischemic change. Mild-moderate diffuse cerebral volume loss. Vascular: Atherosclerotic calcifications involving the large vessels of the skull base. No unexpected hyperdense vessel. Skull: Normal. Negative for fracture or focal lesion. Sinuses/Orbits: Mucosal thickening with air-fluid level in the left sphenoid sinus. Scattered ethmoid air cell mucosal thickening. Orbital structures unremarkable. Other: None. CT CERVICAL SPINE FINDINGS Alignment: Facet joints are aligned without dislocation. Dens and lateral masses are aligned. No static listhesis. Skull base and vertebrae: No acute fracture. No primary bone lesion or focal pathologic process. Soft tissues and spinal canal: No prevertebral fluid or swelling. No visible canal hematoma. Disc levels: Multilevel intervertebral disc height loss, similar to the previous study. Relatively mild facet and uncovertebral arthropathy. No evidence of high-grade canal stenosis by CT. Upper chest: Biapical pleuroparenchymal scarring. Other: Unchanged subcentimeter thyroid lobe nodules. Not clinically significant; no follow-up imaging recommended (ref: J Am Coll Radiol. 2015 Feb;12(2): 143-50). IMPRESSION: 1. No acute intracranial findings. 2. No acute fracture or traumatic listhesis of the cervical spine. 3. Left sphenoid sinus  disease. Correlate for acute sinusitis. Electronically Signed   By: Davina Poke D.O.   On: 12/28/2019 13:40   CT Cervical Spine Wo Contrast  Result Date: 12/28/2019 CLINICAL DATA:  Fall, left-sided neck pain, right forehead abrasion EXAM: CT HEAD WITHOUT CONTRAST CT CERVICAL SPINE WITHOUT CONTRAST TECHNIQUE: Multidetector CT imaging of the head and cervical spine was performed following the standard protocol without intravenous contrast. Multiplanar CT image reconstructions of the cervical spine were also generated. COMPARISON:  11/12/2013, 02/08/2018 FINDINGS: CT HEAD FINDINGS Brain: No evidence of acute infarction, hemorrhage, hydrocephalus, extra-axial collection or mass lesion/mass effect. Low-density changes within the periventricular and subcortical white matter compatible with chronic microvascular ischemic change. Mild-moderate diffuse cerebral volume loss. Vascular: Atherosclerotic calcifications involving the large vessels of the skull base. No unexpected hyperdense vessel. Skull: Normal. Negative for fracture or focal lesion. Sinuses/Orbits: Mucosal thickening with air-fluid level in the left sphenoid sinus. Scattered ethmoid air cell mucosal thickening. Orbital structures unremarkable. Other: None. CT CERVICAL SPINE FINDINGS Alignment: Facet joints are aligned without dislocation. Dens and lateral masses are aligned. No static listhesis. Skull base and vertebrae: No acute fracture. No primary bone lesion or focal pathologic process. Soft tissues and spinal canal: No prevertebral fluid or swelling. No visible canal hematoma. Disc levels: Multilevel intervertebral disc height loss, similar to the previous study. Relatively mild facet and uncovertebral arthropathy. No evidence of high-grade canal stenosis by CT. Upper chest: Biapical pleuroparenchymal scarring. Other: Unchanged subcentimeter thyroid lobe nodules. Not clinically  significant; no follow-up imaging recommended (ref: J Am Coll Radiol.  2015 Feb;12(2): 143-50). IMPRESSION: 1. No acute intracranial findings. 2. No acute fracture or traumatic listhesis of the cervical spine. 3. Left sphenoid sinus disease. Correlate for acute sinusitis. Electronically Signed   By: Davina Poke D.O.   On: 12/28/2019 13:40   DG Shoulder Left  Result Date: 12/28/2019 CLINICAL DATA:  Pain secondary to a fall today. EXAM: LEFT SHOULDER - 2+ VIEW COMPARISON:  None. FINDINGS: There is no evidence of fracture or dislocation. There is no evidence of arthropathy or other focal bone abnormality. Soft tissues are unremarkable. Pulmonary fibrosis. IMPRESSION: Normal left shoulder. Electronically Signed   By: Lorriane Shire M.D.   On: 12/28/2019 13:50    Procedures Procedures (including critical care time)  Medications Ordered in ED Medications  acetaminophen (TYLENOL) tablet 650 mg (650 mg Oral Given 12/28/19 1250)    ED Course  I have reviewed the triage vital signs and the nursing notes.  Pertinent labs & imaging results that were available during my care of the patient were reviewed by me and considered in my medical decision making (see chart for details).  Clinical Course as of Dec 28 1538  Sat Dec 28, 6270  143 86 year old female presents for evaluation after mechanical fall resulting in bruising to her right forehead as well as pain in her neck.  On exam patient was found to have pain with range of motion of her left shoulder localized to left trapezius on further exam as well as tenderness to her low back.  CT head and C-spine without acute injury, x-ray lumbar spine and left shoulder without acute findings.  Patient was given Tylenol for her pain.  Discussed with Dr. Tamera Punt, ER attending who has seen the patient, patient will be discharged to follow-up with her PCP.   [LM]    Clinical Course User Index [LM] Roque Lias   MDM Rules/Calculators/A&P                         Final Clinical Impression(s) / ED Diagnoses Final  diagnoses:  Fall, initial encounter  Contusion of head, unspecified part of head, initial encounter    Rx / DC Orders ED Discharge Orders    None       Tacy Learn, PA-C 12/28/19 1541    Malvin Johns, MD 12/29/19 [phone]

## 2019-12-28 NOTE — ED Triage Notes (Addendum)
Pt tripped and fell over O2 tubing this morning.No Loc, left neck pain and bruising with abrasion to rt forehead. Soft c collar placed on pt in triage

## 2019-12-28 NOTE — ED Notes (Signed)
Discharge paperwork reviewed with pt, pts family member.  Pt with no questions or concerns at this time.  NT wheeled pt to ED entrance.

## 2020-01-03 ENCOUNTER — Other Ambulatory Visit: Payer: Self-pay

## 2020-01-03 ENCOUNTER — Telehealth: Payer: Self-pay | Admitting: Pharmacist

## 2020-01-03 ENCOUNTER — Encounter: Payer: Self-pay | Admitting: Internal Medicine

## 2020-01-03 ENCOUNTER — Ambulatory Visit (INDEPENDENT_AMBULATORY_CARE_PROVIDER_SITE_OTHER): Payer: Medicare Other | Admitting: Internal Medicine

## 2020-01-03 VITALS — BP 116/72 | HR 55 | Temp 98.3°F | Ht 64.5 in | Wt 105.0 lb

## 2020-01-03 DIAGNOSIS — R5383 Other fatigue: Secondary | ICD-10-CM

## 2020-01-03 DIAGNOSIS — R0602 Shortness of breath: Secondary | ICD-10-CM

## 2020-01-03 DIAGNOSIS — J9611 Chronic respiratory failure with hypoxia: Secondary | ICD-10-CM

## 2020-01-03 DIAGNOSIS — E559 Vitamin D deficiency, unspecified: Secondary | ICD-10-CM | POA: Diagnosis not present

## 2020-01-03 DIAGNOSIS — M25512 Pain in left shoulder: Secondary | ICD-10-CM | POA: Diagnosis not present

## 2020-01-03 DIAGNOSIS — E538 Deficiency of other specified B group vitamins: Secondary | ICD-10-CM

## 2020-01-03 DIAGNOSIS — D500 Iron deficiency anemia secondary to blood loss (chronic): Secondary | ICD-10-CM

## 2020-01-03 DIAGNOSIS — R350 Frequency of micturition: Secondary | ICD-10-CM

## 2020-01-03 LAB — URINALYSIS, ROUTINE W REFLEX MICROSCOPIC
Nitrite: POSITIVE — AB
Specific Gravity, Urine: 1.025 (ref 1.000–1.030)
Urine Glucose: NEGATIVE
Urobilinogen, UA: 4 — AB (ref 0.0–1.0)
pH: 6 (ref 5.0–8.0)

## 2020-01-03 LAB — COMPREHENSIVE METABOLIC PANEL
ALT: 14 U/L (ref 0–35)
AST: 21 U/L (ref 0–37)
Albumin: 3.7 g/dL (ref 3.5–5.2)
Alkaline Phosphatase: 74 U/L (ref 39–117)
BUN: 11 mg/dL (ref 6–23)
CO2: 30 mEq/L (ref 19–32)
Calcium: 9.4 mg/dL (ref 8.4–10.5)
Chloride: 101 mEq/L (ref 96–112)
Creatinine, Ser: 0.81 mg/dL (ref 0.40–1.20)
GFR: 80.88 mL/min (ref >60.00)
Glucose, Bld: 82 mg/dL (ref 70–99)
Potassium: 4.3 mEq/L (ref 3.5–5.1)
Sodium: 139 mEq/L (ref 135–145)
Total Bilirubin: 0.7 mg/dL (ref 0.2–1.2)
Total Protein: 6.9 g/dL (ref 6.0–8.3)

## 2020-01-03 LAB — VITAMIN D 25 HYDROXY (VIT D DEFICIENCY, FRACTURES): VITD: 41.76 ng/mL (ref 30.00–100.00)

## 2020-01-03 LAB — CBC
HCT: 35.9 % — ABNORMAL LOW (ref 36.0–46.0)
Hemoglobin: 11.8 g/dL — ABNORMAL LOW (ref 12.0–15.0)
MCHC: 32.8 g/dL (ref 30.0–36.0)
MCV: 98.5 fl (ref 78.0–100.0)
Platelets: 197 10*3/uL (ref 150.0–400.0)
RBC: 3.64 Mil/uL — ABNORMAL LOW (ref 3.87–5.11)
RDW: 14.5 % (ref 11.5–15.5)
WBC: 10 10*3/uL (ref 4.0–10.5)

## 2020-01-03 LAB — VITAMIN B12: Vitamin B-12: 563 pg/mL (ref 211–911)

## 2020-01-03 LAB — TSH: TSH: 4.9 u[IU]/mL — ABNORMAL HIGH (ref 0.35–4.50)

## 2020-01-03 NOTE — Patient Instructions (Signed)
We will check the blood work today. ° ° °

## 2020-01-03 NOTE — Assessment & Plan Note (Signed)
Ordered U/A today to assess for infection given recent fall. Treat as appropriate.

## 2020-01-03 NOTE — Assessment & Plan Note (Signed)
Can use tylenol for pain and this has been effective so far. X-ray reviewed with her and this is reassuring.

## 2020-01-03 NOTE — Progress Notes (Signed)
   Subjective:   Patient ID: [name] [name], female    DOB: 03-Jan-1931, 86 y.o.   MRN: [id number]  HPI The patient is an 86 YO female coming in for ER follow up (tripped over her oxygen line and fell, denies LOC or passing out, did hit heat and neck, sought care at ER, CT head/neck and x-ray shoulder and low spine without fractures, she is having mild headache but not bad, some left shoulder pain which she hit during fall, is normally very careful and has not tripped over lines in 4 years she has had oxygen) and fatigue (has been feeling this way for some time, mild dizziness at times, denies talking about this before with [place], denies weight change recently, diet stable, sleeping fine), and concerns about UTI (see is having frequency, no burning, no urgency, denies changes in medications recently, denies fevers or chills, no stomach pain, no back pain).   Review of Systems  Constitutional: Negative.   HENT: Negative.   Eyes: Negative.   Respiratory: Positive for shortness of breath. Negative for cough and chest tightness.   Cardiovascular: Negative for chest pain, palpitations and leg swelling.  Gastrointestinal: Negative for abdominal distention, abdominal pain, constipation, diarrhea, nausea and vomiting.  Musculoskeletal: Positive for arthralgias and back pain.  Skin: Negative.   Neurological: Negative.   Psychiatric/Behavioral: Negative.     Objective:  Physical Exam Constitutional:      Appearance: She is well-developed.  HENT:     Head: Normocephalic and atraumatic.  Cardiovascular:     Rate and Rhythm: Normal rate and regular rhythm.     Comments: Did not wear her oxygen to visit Pulmonary:     Effort: Pulmonary effort is normal. No respiratory distress.     Breath sounds: Normal breath sounds. No wheezing or rales.  Abdominal:     General: Bowel sounds are normal. There is no distension.     Palpations: Abdomen is soft.     Tenderness: There is no abdominal tenderness. There  is no rebound.  Musculoskeletal:     Cervical back: Normal range of motion.  Skin:    General: Skin is warm and dry.  Neurological:     Mental Status: She is alert and oriented to person, place, and time.     Coordination: Coordination normal.     Vitals:   01/03/20 1352  BP: 116/72  Pulse: (!) 55  Temp: 98.3 F (36.8 C)  TempSrc: Oral  SpO2: (!) 77%  Weight: 105 lb (47.6 kg)  Height: 5' 4.5" (1.638 m)    This visit occurred during the SARS-CoV-2 public health emergency.  Safety protocols were in place, including screening questions prior to the visit, additional usage of staff PPE, and extensive cleaning of exam room while observing appropriate contact time as indicated for disinfecting solutions.   Assessment & Plan:

## 2020-01-03 NOTE — Assessment & Plan Note (Signed)
Checking BNP, CBC, CMP, thyroid, vitamin B12 and D to rule out metabolic cause. Could be related to pulmonary issues and chronic hypoxia.

## 2020-01-03 NOTE — Telephone Encounter (Signed)
I spoke with patient after visit with PCP to help with medications. Pt is confused about when to take her meds and which ones she can take together.   We went over all medications including indication, optimal administration and timing, and drug interactions. Answered all questions from patient and caregiver. Patient provided with annotated medication list and voiced understanding of instructions.  Outpatient Encounter Medications as of 01/03/2020  Medication Sig  . ALPRAZolam (XANAX) 0.25 MG tablet Take 1 tablet (0.25 mg total) by mouth at bedtime as needed for anxiety.  [name] aspirin EC 81 MG tablet Take 1 tablet (81 mg total) by mouth daily.  [name] atorvastatin (LIPITOR) 40 MG tablet Take 1 tablet (40 mg total) by mouth daily.  . dapsone 100 MG tablet TAKE 1 TABLET(100 MG) BY MOUTH DAILY  . fluticasone (FLONASE) 50 MCG/ACT nasal spray Place 2 sprays into both nostrils daily as needed for allergies or rhinitis.  . furosemide (LASIX) 20 MG tablet Take 1 tablet by mouth daily as needed for swelling.  . hydrocortisone (ANUSOL-HC) 2.5 % rectal cream Place 1 application rectally 2 (two) times daily.  . InFLIXimab (REMICADE IV) Inject into the vein.  [name] latanoprost (XALATAN) 0.005 % ophthalmic solution Place 1 drop into both eyes at bedtime.   . levalbuterol (XOPENEX HFA) 45 MCG/ACT inhaler Inhale 2 puffs into the lungs every 6 (six) hours as needed for wheezing.  . metoprolol succinate (TOPROL XL) 25 MG 24 hr tablet Take 1 tablet (25 mg total) by mouth daily.  . mirtazapine (REMERON) 15 MG tablet Take 1 tablet (15 mg total) by mouth at bedtime.  . mycophenolate (CELLCEPT) 500 MG tablet 2 tabs bid  . nitroGLYCERIN (NITROSTAT) 0.4 MG SL tablet Place 1 tablet (0.4 mg total) under the tongue every 5 (five) minutes as needed for chest pain.  [name] OFEV 150 MG CAPS TAKE 1 CAPSULE TWICE A DAY  . Polyethyl Glycol-Propyl Glycol (SYSTANE) 0.4-0.3 % SOLN Apply 1 drop to eye daily as needed (for dry eyes).  . polyethylene  glycol powder (GLYCOLAX/MIRALAX) powder Take 17 g by mouth 2 (two) times daily as needed.  . predniSONE (DELTASONE) 5 MG tablet Take 1 tablet (5 mg total) by mouth daily with breakfast.  . silver sulfADIAZINE (SILVADENE) 1 % cream Apply 1 application topically daily.  [name] triamcinolone cream (KENALOG) 0.1 % Apply 1 application topically 2 (two) times daily.  . valACYclovir (VALTREX) 1000 MG tablet TAKE 1 TABLET TWICE A DAY (Patient taking differently: Take 1,000 mg by mouth 2 (two) times daily as needed. )   No facility-administered encounter medications on file as of 01/03/2020.    Charlene Brooke, PharmD Clinical Pharmacist [facility] [phone]

## 2020-01-03 NOTE — Assessment & Plan Note (Addendum)
She did not use oxygen during visit and had low O2 saturation. Put on our oxygen and levels went to >89%. Reminded to use with exertion at all times.

## 2020-01-03 NOTE — Assessment & Plan Note (Signed)
Needs CBC today to ensure no change in Hg given fall and new fatigue.

## 2020-01-06 ENCOUNTER — Other Ambulatory Visit: Payer: Self-pay | Admitting: Internal Medicine

## 2020-01-06 MED ORDER — NITROFURANTOIN MONOHYD MACRO 100 MG PO CAPS: 100.0000 mg | ORAL_CAPSULE | Freq: Two times a day (BID) | ORAL | 0 refills | Status: DC

## 2020-01-15 ENCOUNTER — Telehealth: Payer: Self-pay | Admitting: Pulmonary Disease

## 2020-01-15 NOTE — Telephone Encounter (Signed)
Spoke with patient and provided detailed information per [facility] and advised she follow up with her pcp as we had already discussed.  She verbalized understanding.  Nothing further needed.

## 2020-01-15 NOTE — Telephone Encounter (Signed)
UTI and dizziness are listed as potential side effects although rare. Confusion is not.  This sounds more related to acute UTI than cellcept. Follow up with PCP.   CC: Dr. Vaughan Browner

## 2020-01-15 NOTE — Telephone Encounter (Signed)
Spoke with patient regarding the symptoms she has been experiencing for about 3 months and believes it is related to the mycophenolate (CELLCEPT) 500 MG tablet.  She is taking 2 tables in the morning and 1 in the evening.  She states she is experiencing problems with balance, confusion and a UTI.  She was seen by her pcp and prescribed Macrobid for the UTI and has just completed the antibiotic, but is still having pain with urination.  Patient advised to contact her pcp and make them aware.  Beth, Please advise regarding the side effects possibly related to the Mycophenolate.  Thank you.

## 2020-01-24 ENCOUNTER — Emergency Department (HOSPITAL_BASED_OUTPATIENT_CLINIC_OR_DEPARTMENT_OTHER): Payer: Medicare Other

## 2020-01-24 ENCOUNTER — Telehealth: Payer: Self-pay | Admitting: Internal Medicine

## 2020-01-24 ENCOUNTER — Inpatient Hospital Stay (HOSPITAL_BASED_OUTPATIENT_CLINIC_OR_DEPARTMENT_OTHER)
Admission: EM | Admit: 2020-01-24 | Discharge: 2020-01-27 | DRG: 604 | Disposition: A | Discharge status: Home Health | Payer: Medicare Other | Attending: Internal Medicine | Admitting: Internal Medicine

## 2020-01-24 ENCOUNTER — Emergency Department (HOSPITAL_COMMUNITY)
Admission: EM | Admit: 2020-01-24 | Discharge: 2020-01-24 | Disposition: A | Discharge status: Home / Self Care | Payer: Medicare Other | Source: Home / Self Care

## 2020-01-24 ENCOUNTER — Other Ambulatory Visit: Payer: Self-pay

## 2020-01-24 ENCOUNTER — Encounter (HOSPITAL_BASED_OUTPATIENT_CLINIC_OR_DEPARTMENT_OTHER): Payer: Self-pay | Admitting: *Deleted

## 2020-01-24 ENCOUNTER — Encounter (HOSPITAL_COMMUNITY): Payer: Self-pay

## 2020-01-24 DIAGNOSIS — G4700 Insomnia, unspecified: Secondary | ICD-10-CM | POA: Diagnosis present

## 2020-01-24 DIAGNOSIS — W01198A Fall on same level from slipping, tripping and stumbling with subsequent striking against other object, initial encounter: Secondary | ICD-10-CM | POA: Insufficient documentation

## 2020-01-24 DIAGNOSIS — R634 Abnormal weight loss: Secondary | ICD-10-CM

## 2020-01-24 DIAGNOSIS — S299XXA Unspecified injury of thorax, initial encounter: Secondary | ICD-10-CM | POA: Diagnosis not present

## 2020-01-24 DIAGNOSIS — Z20822 Contact with and (suspected) exposure to covid-19: Secondary | ICD-10-CM | POA: Diagnosis present

## 2020-01-24 DIAGNOSIS — R971 Elevated cancer antigen 125 [CA 125]: Secondary | ICD-10-CM | POA: Diagnosis not present

## 2020-01-24 DIAGNOSIS — Y999 Unspecified external cause status: Secondary | ICD-10-CM | POA: Insufficient documentation

## 2020-01-24 DIAGNOSIS — J8410 Pulmonary fibrosis, unspecified: Secondary | ICD-10-CM | POA: Diagnosis present

## 2020-01-24 DIAGNOSIS — S0101XA Laceration without foreign body of scalp, initial encounter: Secondary | ICD-10-CM | POA: Diagnosis not present

## 2020-01-24 DIAGNOSIS — R079 Chest pain, unspecified: Secondary | ICD-10-CM | POA: Diagnosis present

## 2020-01-24 DIAGNOSIS — I2510 Atherosclerotic heart disease of native coronary artery without angina pectoris: Secondary | ICD-10-CM | POA: Diagnosis present

## 2020-01-24 DIAGNOSIS — M069 Rheumatoid arthritis, unspecified: Secondary | ICD-10-CM | POA: Diagnosis present

## 2020-01-24 DIAGNOSIS — Y929 Unspecified place or not applicable: Secondary | ICD-10-CM | POA: Insufficient documentation

## 2020-01-24 DIAGNOSIS — S199XXA Unspecified injury of neck, initial encounter: Secondary | ICD-10-CM | POA: Diagnosis not present

## 2020-01-24 DIAGNOSIS — E43 Unspecified severe protein-calorie malnutrition: Secondary | ICD-10-CM | POA: Diagnosis not present

## 2020-01-24 DIAGNOSIS — Z809 Family history of malignant neoplasm, unspecified: Secondary | ICD-10-CM

## 2020-01-24 DIAGNOSIS — E739 Lactose intolerance, unspecified: Secondary | ICD-10-CM | POA: Diagnosis present

## 2020-01-24 DIAGNOSIS — Z96653 Presence of artificial knee joint, bilateral: Secondary | ICD-10-CM | POA: Diagnosis present

## 2020-01-24 DIAGNOSIS — Z86711 Personal history of pulmonary embolism: Secondary | ICD-10-CM

## 2020-01-24 DIAGNOSIS — J9611 Chronic respiratory failure with hypoxia: Secondary | ICD-10-CM | POA: Diagnosis present

## 2020-01-24 DIAGNOSIS — W19XXXA Unspecified fall, initial encounter: Secondary | ICD-10-CM | POA: Diagnosis not present

## 2020-01-24 DIAGNOSIS — Z681 Body mass index (BMI) 19 or less, adult: Secondary | ICD-10-CM | POA: Diagnosis not present

## 2020-01-24 DIAGNOSIS — R0902 Hypoxemia: Secondary | ICD-10-CM | POA: Diagnosis not present

## 2020-01-24 DIAGNOSIS — I5032 Chronic diastolic (congestive) heart failure: Secondary | ICD-10-CM | POA: Diagnosis not present

## 2020-01-24 DIAGNOSIS — Z7982 Long term (current) use of aspirin: Secondary | ICD-10-CM

## 2020-01-24 DIAGNOSIS — Z86718 Personal history of other venous thrombosis and embolism: Secondary | ICD-10-CM

## 2020-01-24 DIAGNOSIS — Z880 Allergy status to penicillin: Secondary | ICD-10-CM

## 2020-01-24 DIAGNOSIS — M0510 Rheumatoid lung disease with rheumatoid arthritis of unspecified site: Secondary | ICD-10-CM | POA: Diagnosis present

## 2020-01-24 DIAGNOSIS — E785 Hyperlipidemia, unspecified: Secondary | ICD-10-CM | POA: Diagnosis present

## 2020-01-24 DIAGNOSIS — R7989 Other specified abnormal findings of blood chemistry: Secondary | ICD-10-CM | POA: Diagnosis not present

## 2020-01-24 DIAGNOSIS — Z7952 Long term (current) use of systemic steroids: Secondary | ICD-10-CM

## 2020-01-24 DIAGNOSIS — T45625A Adverse effect of hemostatic drug, initial encounter: Secondary | ICD-10-CM | POA: Diagnosis present

## 2020-01-24 DIAGNOSIS — I77810 Thoracic aortic ectasia: Secondary | ICD-10-CM | POA: Diagnosis not present

## 2020-01-24 DIAGNOSIS — R58 Hemorrhage, not elsewhere classified: Secondary | ICD-10-CM | POA: Diagnosis not present

## 2020-01-24 DIAGNOSIS — Z5321 Procedure and treatment not carried out due to patient leaving prior to being seen by health care provider: Secondary | ICD-10-CM | POA: Insufficient documentation

## 2020-01-24 DIAGNOSIS — W01190A Fall on same level from slipping, tripping and stumbling with subsequent striking against furniture, initial encounter: Secondary | ICD-10-CM | POA: Diagnosis present

## 2020-01-24 DIAGNOSIS — Z87891 Personal history of nicotine dependence: Secondary | ICD-10-CM

## 2020-01-24 DIAGNOSIS — S0181XA Laceration without foreign body of other part of head, initial encounter: Secondary | ICD-10-CM | POA: Insufficient documentation

## 2020-01-24 DIAGNOSIS — S51011A Laceration without foreign body of right elbow, initial encounter: Secondary | ICD-10-CM | POA: Diagnosis present

## 2020-01-24 DIAGNOSIS — Z79899 Other long term (current) drug therapy: Secondary | ICD-10-CM

## 2020-01-24 DIAGNOSIS — Z888 Allergy status to other drugs, medicaments and biological substances status: Secondary | ICD-10-CM

## 2020-01-24 DIAGNOSIS — J439 Emphysema, unspecified: Secondary | ICD-10-CM | POA: Diagnosis not present

## 2020-01-24 DIAGNOSIS — Z8249 Family history of ischemic heart disease and other diseases of the circulatory system: Secondary | ICD-10-CM

## 2020-01-24 DIAGNOSIS — R55 Syncope and collapse: Secondary | ICD-10-CM | POA: Diagnosis not present

## 2020-01-24 DIAGNOSIS — R9431 Abnormal electrocardiogram [ECG] [EKG]: Secondary | ICD-10-CM | POA: Diagnosis not present

## 2020-01-24 DIAGNOSIS — R0789 Other chest pain: Secondary | ICD-10-CM | POA: Diagnosis not present

## 2020-01-24 DIAGNOSIS — Y92012 Bathroom of single-family (private) house as the place of occurrence of the external cause: Secondary | ICD-10-CM

## 2020-01-24 DIAGNOSIS — Z820 Family history of epilepsy and other diseases of the nervous system: Secondary | ICD-10-CM

## 2020-01-24 DIAGNOSIS — S59901A Unspecified injury of right elbow, initial encounter: Secondary | ICD-10-CM | POA: Diagnosis not present

## 2020-01-24 DIAGNOSIS — Z9981 Dependence on supplemental oxygen: Secondary | ICD-10-CM

## 2020-01-24 DIAGNOSIS — Y939 Activity, unspecified: Secondary | ICD-10-CM | POA: Insufficient documentation

## 2020-01-24 LAB — CBC WITH DIFFERENTIAL/PLATELET
Abs Immature Granulocytes: 0.04 10*3/uL (ref 0.00–0.07)
Basophils Absolute: 0.1 10*3/uL (ref 0.0–0.1)
Basophils Relative: 1 %
Eosinophils Absolute: 0.2 10*3/uL (ref 0.0–0.5)
Eosinophils Relative: 2 %
HCT: 38.1 % (ref 36.0–46.0)
Hemoglobin: 11.8 g/dL — ABNORMAL LOW (ref 12.0–15.0)
Immature Granulocytes: 0 %
Lymphocytes Relative: 46 %
Lymphs Abs: 4.4 10*3/uL — ABNORMAL HIGH (ref 0.7–4.0)
MCH: 31.1 pg (ref 26.0–34.0)
MCHC: 31 g/dL (ref 30.0–36.0)
MCV: 100.5 fL — ABNORMAL HIGH (ref 80.0–100.0)
Monocytes Absolute: 0.9 10*3/uL (ref 0.1–1.0)
Monocytes Relative: 10 %
Neutro Abs: 3.9 10*3/uL (ref 1.7–7.7)
Neutrophils Relative %: 41 %
Platelets: 209 10*3/uL (ref 150–400)
RBC: 3.79 MIL/uL — ABNORMAL LOW (ref 3.87–5.11)
RDW: 14.2 % (ref 11.5–15.5)
WBC: 9.5 10*3/uL (ref 4.0–10.5)
nRBC: 0 % (ref 0.0–0.2)

## 2020-01-24 LAB — COMPREHENSIVE METABOLIC PANEL
ALT: 17 U/L (ref 0–44)
AST: 23 U/L (ref 15–41)
Albumin: 3.6 g/dL (ref 3.5–5.0)
Alkaline Phosphatase: 73 U/L (ref 38–126)
Anion gap: 10 (ref 5–15)
BUN: 10 mg/dL (ref 8–23)
CO2: 27 mmol/L (ref 22–32)
Calcium: 9 mg/dL (ref 8.9–10.3)
Chloride: 101 mmol/L (ref 98–111)
Creatinine, Ser: 0.57 mg/dL (ref 0.44–1.00)
GFR calc Af Amer: >60 mL/min (ref >60)
GFR calc non Af Amer: >60 mL/min (ref >60)
Glucose, Bld: 88 mg/dL (ref 70–99)
Potassium: 3.6 mmol/L (ref 3.5–5.1)
Sodium: 138 mmol/L (ref 135–145)
Total Bilirubin: 1 mg/dL (ref 0.3–1.2)
Total Protein: 7.3 g/dL (ref 6.5–8.1)

## 2020-01-24 LAB — TROPONIN I (HIGH SENSITIVITY)
Troponin I (High Sensitivity): 3 ng/L (ref <18)
Troponin I (High Sensitivity): 3 ng/L (ref <18)

## 2020-01-24 LAB — SARS CORONAVIRUS 2 BY RT PCR (HOSPITAL ORDER, PERFORMED IN ~~LOC~~ HOSPITAL LAB): SARS Coronavirus 2: NEGATIVE

## 2020-01-24 LAB — D-DIMER, QUANTITATIVE: D-Dimer, Quant: 2.11 ug/mL-FEU — ABNORMAL HIGH (ref 0.00–0.50)

## 2020-01-24 MED ORDER — LIDOCAINE-EPINEPHRINE 1 %-1:100000 IJ SOLN
10.0000 mL | Freq: Once | INTRAMUSCULAR | Status: AC
  Administered 2020-01-24: 10 mL
  Filled 2020-01-24: qty 1

## 2020-01-24 MED ORDER — IOHEXOL 350 MG/ML SOLN
100.0000 mL | Freq: Once | INTRAVENOUS | Status: AC | PRN
  Administered 2020-01-24: 100 mL via INTRAVENOUS

## 2020-01-24 MED ORDER — LIDOCAINE-EPINEPHRINE-TETRACAINE (LET) TOPICAL GEL
3.0000 mL | Freq: Once | TOPICAL | Status: AC
  Administered 2020-01-24: 3 mL via TOPICAL
  Filled 2020-01-24: qty 3

## 2020-01-24 NOTE — Telephone Encounter (Signed)
F/u   Nephew calling asking for urgent order over to [facility]

## 2020-01-24 NOTE — Telephone Encounter (Signed)
New message:   Pt's nephew is calling and states the pt fell this morning and has hit her head. The ambulance came and took her to [facility]. He states the wait is so long for a bed and he would like to know if Dr.Crawford would send and order for a CT Scan to [facility] to make sure she doesn't have anything else going on. Please advise.

## 2020-01-24 NOTE — ED Triage Notes (Signed)
Pt had a mechanical fall tonight and hit her head on the edge of the marble counter in the bathroom. Laceration noted to center of forehead. Bleeding controlled. No on blood thinners. A&Ox4. Wears 2L at home.

## 2020-01-24 NOTE — ED Notes (Signed)
Pt and nephew chose to follow up with primary care for outpatient CT scan d/t wait time. Skin tear on patient's elbow flushed and bandaged. Pt went with family in no distress.

## 2020-01-24 NOTE — Discharge Instructions (Addendum)
Based on your penicillin allergy I do not think the benefits of antibiotics outweigh the risks.  I would recommend you follow up with your primary care doctor on Monday for a re-check.   Please take Tylenol (acetaminophen) to relieve your pain.  You may take tylenol, up to 1,000 mg (two extra strength pills).  Do not take more than 3,000 mg tylenol in a 24 hour period.  Please check all medication labels as many medications such as pain and cold medications may contain tylenol. Please do not drink alcohol while taking this medication.

## 2020-01-24 NOTE — ED Notes (Signed)
Pt states she got up to go to [place] m about 2 am and was clumsy and fell hitting the top of her forehead, pt has lact to middle of forehead bleeding controlled at this time , pt does states that she is on o2 occaisionally, has it with her

## 2020-01-24 NOTE — Telephone Encounter (Signed)
    Patient's nephew made aware of Dr Nathanial Millman recommendation

## 2020-01-24 NOTE — Telephone Encounter (Signed)
I would recommend to go to [facility] instead for shorter wait. If she is having excessive bleeding from a head wound she will need medical attention and not just a scan.

## 2020-01-24 NOTE — ED Provider Notes (Addendum)
[facility] EMERGENCY DEPARTMENT Provider Note   CSN: [id number] Arrival date & time: 01/24/20  1251     History Chief Complaint  Patient presents with  . Head Injury    [name] [name] is a 86 y.o. female with a past medical history of pulmonary fibrosis, PE, DVT, CAD, rheumatoid arthritis, who presents today for evaluation after a fall.  She reports that this morning she had a  fall and struck her head on the edge of the marble counter in the bathroom.  She does not take any blood thinning medication.  She denies any prodromal symptoms, reports she has been well recently.  She reports pain in her head on the right side of her neck.  She denies any weakness.  She has been ambulatory since.  She thinks that maybe her CellCept is making occasionally dizzy however she denies feeling dizzy prior to this fall, thinks it was due to not wearing her shoes in the bathroom.  She reports that she has intermittent chest pain that was hurting this morning. She states that she is not currently having chest pain.  She has a history of PE and is not anticoagulated due to her falls.    Review does show that she has CHF listed.  On chart review I see that this was added after she had a take Lasix due to leg edema.  No significantly elevated BNP reviewed.  Her last echo was without evidence of systolic dysfunction or significant heart failure.   HPI     Past Medical History:  Diagnosis Date  . Allergic rhinitis   . Allergy   . CAD (coronary artery disease)    Mild CAD by cath 2008  . DJD (degenerative joint disease)    rheumatoid  . DVT (deep venous thrombosis) ([place])   . Dyslipidemia   . History of echocardiogram    Echo 12/2018: EF 60-65, mod asymmetric LVH, Gr 1 DD  . History of nuclear stress test    Myoview 5/17:  EF 74%, normal perfusion, low risk // Myoview 12/2018:  EF 89, very mild ischemia in inferoapical wall; Low Risk  . History of pulmonary embolism   . Hyperlipidemia   .  Insomnia   . Osteoporosis   . Psoriasis   . Pulmonary fibrosis ([place])   . Rheumatoid arthritis [facility])     Patient Active Problem List   Diagnosis Date Noted  . Other fatigue 01/03/2020  . Left shoulder pain 01/03/2020  . Urinary frequency 01/03/2020  . Weight loss 07/18/2019  . Personal history of PE (pulmonary embolism) 04/23/2019  . Headache 02/11/2019  . Iron deficiency anemia 12/21/2018  . Cold sore 10/23/2018  . Blood in stool 09/14/2018  . Therapeutic drug monitoring 07/17/2018  . Chronic diastolic CHF (congestive heart failure) ([place]) 12/19/2017  . Rash 08/11/2017  . Chronic respiratory failure with hypoxia ([place]) 03/30/2017  . Angular cheilitis 03/08/2017  . Leg pain 10/25/2016  . Back pain 08/05/2016  . RA (rheumatoid arthritis) ([place]) 03/31/2015  . Allergic rhinitis   . Varicose vein 09/11/2014  . Chest pain 02/27/2012  . CAD (coronary artery disease) 02/27/2012  . Pruritus 08/18/2009  . Postinflammatory pulmonary fibrosis / RA ILD  06/30/2008  . Constipation 01/03/2008  . Dyslipidemia 06/16/2007  . Personal history of DVT (deep vein thrombosis) 06/16/2007    Past Surgical History:  Procedure Laterality Date  . ABDOMINAL HYSTERECTOMY  1974  . APPENDECTOMY  1959  . CATARACT EXTRACTION    .  LEFT HEART CATHETERIZATION WITH CORONARY ANGIOGRAM N/A 06/18/2013   Procedure: LEFT HEART CATHETERIZATION WITH CORONARY ANGIOGRAM;  Surgeon: Peter M Martinique, MD;  Location: [facility] CATH LAB;  Service: Cardiovascular;  Laterality: N/A;  . TONSILLECTOMY  1941, 1951  . TOTAL KNEE ARTHROPLASTY  1997, 2007   Bilateral     OB History    Gravida  4   Para  2   Term  2   Preterm      AB      Living  0     SAB      TAB      Ectopic      Multiple      Live Births              Family History  Problem Relation Age of Onset  . Kidney disease Daughter 5  . Cancer Daughter   . Heart attack Father 36  . Alzheimer's disease Sister   . Heart disease Sister   .  Alzheimer's disease Sister     Social History   Tobacco Use  . Smoking status: Former Smoker    Packs/day: 0.50    Years: 20.00    Pack years: 10.00    Types: Cigarettes    Quit date: 07/11/1973    Years since quitting: 46.5  . Smokeless tobacco: Never Used  Vaping Use  . Vaping Use: Never used  Substance Use Topics  . Alcohol use: No  . Drug use: No    Home Medications Prior to Admission medications   Medication Sig Start Date End Date Taking? Authorizing Provider  ALPRAZolam (XANAX) 0.25 MG tablet Take 1 tablet (0.25 mg total) by mouth at bedtime as needed for anxiety. 04/23/18   Hoyt Koch, MD  aspirin EC 81 MG tablet Take 1 tablet (81 mg total) by mouth daily. 11/30/15   Richardson Dopp T, PA-C  atorvastatin (LIPITOR) 40 MG tablet Take 1 tablet (40 mg total) by mouth daily. 04/19/19   Burnell Blanks, MD  dapsone 100 MG tablet TAKE 1 TABLET(100 MG) BY MOUTH DAILY 12/03/19   Mannam, Praveen, MD  fluticasone (FLONASE) 50 MCG/ACT nasal spray Place 2 sprays into both nostrils daily as needed for allergies or rhinitis. 03/17/15   Hoyt Koch, MD  furosemide (LASIX) 20 MG tablet Take 1 tablet by mouth daily as needed for swelling. 12/11/18   Burtis Junes, NP  hydrocortisone (ANUSOL-HC) 2.5 % rectal cream Place 1 application rectally 2 (two) times daily. 09/13/18   Hoyt Koch, MD  InFLIXimab (REMICADE IV) Inject into the vein.    [provider]  latanoprost (XALATAN) 0.005 % ophthalmic solution Place 1 drop into both eyes at bedtime.  09/06/14   [provider]  levalbuterol Penne Lash HFA) 45 MCG/ACT inhaler Inhale 2 puffs into the lungs every 6 (six) hours as needed for wheezing. 06/05/17   Magdalen Spatz, NP  metoprolol succinate (TOPROL XL) 25 MG 24 hr tablet Take 1 tablet (25 mg total) by mouth daily. 12/12/18   Richardson Dopp T, PA-C  mirtazapine (REMERON) 15 MG tablet Take 1 tablet (15 mg total) by mouth at bedtime. 07/18/19   Hoyt Koch, MD  mycophenolate (CELLCEPT) 500 MG tablet 2 tabs bid 11/15/19   Mannam, Hart Robinsons, MD  nitrofurantoin, macrocrystal-monohydrate, (MACROBID) 100 MG capsule Take 1 capsule (100 mg total) by mouth 2 (two) times daily. 01/06/20   Hoyt Koch, MD  nitroGLYCERIN (NITROSTAT) 0.4 MG SL tablet Place 1  tablet (0.4 mg total) under the tongue every 5 (five) minutes as needed for chest pain. 12/12/18 09/19/19  Weaver, Scott T, PA-C  OFEV 150 MG CAPS TAKE 1 CAPSULE TWICE A DAY 11/11/19   Mannam, Praveen, MD  Polyethyl Glycol-Propyl Glycol (SYSTANE) 0.4-0.3 % SOLN Apply 1 drop to eye daily as needed (for dry eyes).    [provider]  polyethylene glycol powder (GLYCOLAX/MIRALAX) powder Take 17 g by mouth 2 (two) times daily as needed. 09/13/18   Hoyt Koch, MD  predniSONE (DELTASONE) 5 MG tablet Take 1 tablet (5 mg total) by mouth daily with breakfast. 12/03/19   Mannam, Hart Robinsons, MD  silver sulfADIAZINE (SILVADENE) 1 % cream Apply 1 application topically daily. 02/11/19   Hoyt Koch, MD  triamcinolone cream (KENALOG) 0.1 % Apply 1 application topically 2 (two) times daily. 04/23/18   Hoyt Koch, MD  valACYclovir (VALTREX) 1000 MG tablet TAKE 1 TABLET TWICE A DAY Patient taking differently: Take 1,000 mg by mouth 2 (two) times daily as needed.  05/09/19   Hoyt Koch, MD    Allergies    Crestor [rosuvastatin], Lactose intolerance (gi), Penicillins, Imuran [azathioprine], and Niacin  Review of Systems   Review of Systems  Constitutional: Negative for chills and fever.  HENT: Negative for congestion.   Eyes: Negative for visual disturbance.  Respiratory: Negative for choking, chest tightness and shortness of breath.   Cardiovascular: Negative for chest pain.  Gastrointestinal: Negative for abdominal pain, nausea and vomiting.  Genitourinary: Negative for dysuria.  Musculoskeletal: Positive for neck pain.  Skin: Positive for wound (Forehead, left  elbow). Negative for color change.  Neurological: Positive for headaches. Negative for dizziness, seizures, syncope and weakness.  Psychiatric/Behavioral: Negative for confusion. The patient is not nervous/anxious.   All other systems reviewed and are negative.   Physical Exam Updated Vital Signs BP (!) 144/75 (BP Location: Right Arm)   Pulse 83   Temp 98.1 F (36.7 C)   Resp (!) 28   Ht 5\' 4"  (1.626 m)   Wt 47.6 kg   SpO2 95%   BMI 18.02 kg/m   Physical Exam Vitals and nursing note reviewed.  Constitutional:      General: She is not in acute distress.    Appearance: She is well-developed. She is not diaphoretic.  HENT:     Head: Normocephalic.     Comments: T shaped laceration on center of upper forehead with mild surrounding edema.  No active bleeding    Mouth/Throat:     Mouth: Mucous membranes are moist.  Eyes:     General: No scleral icterus.       Right eye: No discharge.        Left eye: No discharge.     Conjunctiva/sclera: Conjunctivae normal.  Cardiovascular:     Rate and Rhythm: Normal rate and regular rhythm.     Pulses: Normal pulses.     Heart sounds: Normal heart sounds.  Pulmonary:     Effort: Pulmonary effort is normal. No respiratory distress.     Breath sounds: Normal breath sounds. No stridor.  Abdominal:     General: There is no distension.     Tenderness: There is no abdominal tenderness. There is no guarding.  Musculoskeletal:        General: No deformity.     Cervical back: Normal range of motion.     Comments: No pain with log roll of BLE.  There is pain in right elbow with  supination and pronation of the arm.  No palpable crepitus or deformities.  No pain in the right upper arm or forearm.  C-spine with diffuse midline tenderness palpation, remainder of spine is without tenderness to palpation.  No step-offs or deformities palpated.  Skin:    General: Skin is warm and dry.     Comments: 3cm skin tear on right elbow.   Neurological:      Mental Status: She is alert. Mental status is at baseline.     Motor: No weakness or abnormal muscle tone.  Psychiatric:        Mood and Affect: Mood normal.        Behavior: Behavior normal.     ED Results / Procedures / Treatments   Labs (all labs ordered are listed, but only abnormal results are displayed) Labs Reviewed  CBC WITH DIFFERENTIAL/PLATELET - Abnormal; Notable for the following components:      Result Value   RBC 3.79 (*)    Hemoglobin 11.8 (*)    MCV 100.5 (*)    Lymphs Abs 4.4 (*)    All other components within normal limits  D-DIMER, QUANTITATIVE (NOT AT [facility]) - Abnormal; Notable for the following components:   D-Dimer, Quant 2.11 (*)    All other components within normal limits  SARS CORONAVIRUS 2 BY RT PCR (HOSPITAL ORDER, [facility])  COMPREHENSIVE METABOLIC PANEL  TROPONIN I (HIGH SENSITIVITY)  TROPONIN I (HIGH SENSITIVITY)    EKG EKG Interpretation  Date/Time:  Friday January 24 2020 16:24:03 EDT Ventricular Rate:  74 PR Interval:    QRS Duration: 76 QT Interval:  399 QTC Calculation: 443 R Axis:   31 Text Interpretation: Sinus rhythm Low voltage, precordial leads Abnormal R-wave progression, early transition No significant change since last tracing Confirmed by Deno Etienne [phone]) on 01/24/2020 6:29:41 PM   Radiology DG Chest 2 View  Result Date: 01/24/2020 CLINICAL DATA:  Chest pain, syncope. EXAM: CHEST - 2 VIEW COMPARISON:  June 18, 2018. FINDINGS: The heart size and mediastinal contours are within normal limits. No pneumothorax is noted. Stable reticular densities are noted throughout both lungs most consistent with pulmonary fibrosis. No definite acute abnormality is noted. No significant pleural effusion is noted. The visualized skeletal structures are unremarkable. IMPRESSION: Stable bilateral pulmonary fibrosis. No significant changes noted compared to prior exam. Electronically Signed   By: Marijo Conception M.D.   On:  01/24/2020 16:33   DG Elbow Complete Right  Result Date: 01/24/2020 CLINICAL DATA:  Fall, right elbow pain EXAM: RIGHT ELBOW - COMPLETE 3+ VIEW COMPARISON:  None. FINDINGS: Four view radiograph right elbow demonstrates normal alignment. No fracture or dislocation. No effusion. Soft tissues are unremarkable. IMPRESSION: Negative. Electronically Signed   By: Fidela Salisbury MD   On: 01/24/2020 15:42   CT Head Wo Contrast  Result Date: 01/24/2020 CLINICAL DATA:  Headache, posttraumatic. Neck trauma. Additional history provided: Fall, forehead laceration. EXAM: CT HEAD WITHOUT CONTRAST CT CERVICAL SPINE WITHOUT CONTRAST TECHNIQUE: Multidetector CT imaging of the head and cervical spine was performed following the standard protocol without intravenous contrast. Multiplanar CT image reconstructions of the cervical spine were also generated. COMPARISON:  Head CT 12/28/2019 FINDINGS: CT HEAD FINDINGS Brain: Stable, mild generalized parenchymal atrophy and chronic small vessel ischemic disease. There is no acute intracranial hemorrhage. No demarcated cortical infarct. No extra-axial fluid collection. No evidence of intracranial mass. No midline shift. Vascular: No hyperdense vessel.  Atherosclerotic calcifications. Skull: Normal. Negative for  fracture or focal lesion. Sinuses/Orbits: Visualized orbits show no acute finding. Scattered mucosal thickening and fluid within ethmoid air cells. Left sphenoid sinus air-fluid level. No significant mastoid effusion. Other: Frontal scalp laceration. CT CERVICAL SPINE FINDINGS Alignment: No significant spondylolisthesis. Skull base and vertebrae: The basion-dental and atlanto-dental intervals are maintained.No evidence of acute fracture to the cervical spine. Soft tissues and spinal canal: No prevertebral fluid or swelling. No visible canal hematoma. Disc levels: Cervical spondylosis with multilevel disc space narrowing, posterior disc osteophytes, uncovertebral and facet  hypertrophy. No high-grade bony spinal canal stenosis or neural foraminal narrowing. Upper chest: No consolidation within the imaged lung apices. No visible pneumothorax. Biapical pleuroparenchymal scarring. IMPRESSION: CT head: 1. No evidence of acute intracranial abnormality. Midline frontal scalp laceration. 2. Stable mild generalized parenchymal atrophy and chronic small vessel ischemic disease. 3. Ethmoid and left sphenoid sinusitis. CT cervical spine: 1. No evidence of acute fracture to the cervical spine. 2. Cervical spondylosis as described. Electronically Signed   By: Kellie Simmering DO   On: 01/24/2020 15:36   CT Angio Chest PE W/Cm &/Or Wo Cm  Result Date: 01/24/2020 CLINICAL DATA:  Elevated D-dimer, fell this morning, previous tobacco abuse, chest pain, syncope EXAM: CT ANGIOGRAPHY CHEST WITH CONTRAST TECHNIQUE: Multidetector CT imaging of the chest was performed using the standard protocol during bolus administration of intravenous contrast. Multiplanar CT image reconstructions and MIPs were obtained to evaluate the vascular anatomy. CONTRAST:  123mL OMNIPAQUE IOHEXOL 350 MG/ML SOLN COMPARISON:  01/24/2020, 05/30/2019 FINDINGS: Cardiovascular: This is a technically adequate evaluation of the pulmonary vasculature. No filling defects or pulmonary emboli. The heart is unremarkable without pericardial effusion. Stable ectasia of the thoracic aorta, with mild dilatation of the ascending aorta measuring 3.3 cm. No dissection. Mediastinum/Nodes: No enlarged mediastinal, hilar, or axillary lymph nodes. Thyroid gland, trachea, and esophagus demonstrate no significant findings. Lungs/Pleura: Extensive changes of emphysema and pulmonary fibrosis are again noted. No acute airspace disease, effusion, or pneumothorax. The central airways are patent. Upper Abdomen: No acute abnormality. Musculoskeletal: No acute or destructive bony lesions. Reconstructed images demonstrate no additional findings. Review of the MIP  images confirms the above findings. IMPRESSION: 1. No evidence of pulmonary embolus. 2. Stable changes of emphysema and pulmonary fibrosis. No acute airspace disease. Electronically Signed   By: Randa Ngo M.D.   On: 01/24/2020 19:05   CT Cervical Spine Wo Contrast  Result Date: 01/24/2020 CLINICAL DATA:  Headache, posttraumatic. Neck trauma. Additional history provided: Fall, forehead laceration. EXAM: CT HEAD WITHOUT CONTRAST CT CERVICAL SPINE WITHOUT CONTRAST TECHNIQUE: Multidetector CT imaging of the head and cervical spine was performed following the standard protocol without intravenous contrast. Multiplanar CT image reconstructions of the cervical spine were also generated. COMPARISON:  Head CT 12/28/2019 FINDINGS: CT HEAD FINDINGS Brain: Stable, mild generalized parenchymal atrophy and chronic small vessel ischemic disease. There is no acute intracranial hemorrhage. No demarcated cortical infarct. No extra-axial fluid collection. No evidence of intracranial mass. No midline shift. Vascular: No hyperdense vessel.  Atherosclerotic calcifications. Skull: Normal. Negative for fracture or focal lesion. Sinuses/Orbits: Visualized orbits show no acute finding. Scattered mucosal thickening and fluid within ethmoid air cells. Left sphenoid sinus air-fluid level. No significant mastoid effusion. Other: Frontal scalp laceration. CT CERVICAL SPINE FINDINGS Alignment: No significant spondylolisthesis. Skull base and vertebrae: The basion-dental and atlanto-dental intervals are maintained.No evidence of acute fracture to the cervical spine. Soft tissues and spinal canal: No prevertebral fluid or swelling. No visible canal hematoma. Disc levels: Cervical spondylosis with  multilevel disc space narrowing, posterior disc osteophytes, uncovertebral and facet hypertrophy. No high-grade bony spinal canal stenosis or neural foraminal narrowing. Upper chest: No consolidation within the imaged lung apices. No visible  pneumothorax. Biapical pleuroparenchymal scarring. IMPRESSION: CT head: 1. No evidence of acute intracranial abnormality. Midline frontal scalp laceration. 2. Stable mild generalized parenchymal atrophy and chronic small vessel ischemic disease. 3. Ethmoid and left sphenoid sinusitis. CT cervical spine: 1. No evidence of acute fracture to the cervical spine. 2. Cervical spondylosis as described. Electronically Signed   By: Kellie Simmering DO   On: 01/24/2020 15:36    Procedures .Marland KitchenLaceration Repair  Date/Time: 01/24/2020 7:11 PM Performed by: Lorin Glass, PA-C Authorized by: Lorin Glass, PA-C   Consent:    Consent obtained:  Verbal   Consent given by:  Patient   Risks discussed:  Infection, need for additional repair, poor cosmetic result, pain, retained foreign body, tendon damage, vascular damage, poor wound healing and nerve damage   Alternatives discussed:  No treatment and referral (Alternative wound closures) Anesthesia (see MAR for exact dosages):    Anesthesia method:  Topical application and local infiltration   Topical anesthetic:  LET   Local anesthetic:  Lidocaine 1% WITH epi Laceration details:    Location:  Scalp   Scalp location:  Frontal   Length (cm):  3 (T shaped) Repair type:    Repair type:  Intermediate Pre-procedure details:    Preparation:  Patient was prepped and draped in usual sterile fashion and imaging obtained to evaluate for foreign bodies Exploration:    Hemostasis achieved with:  LET, epinephrine and direct pressure   Wound exploration: wound explored through full range of motion and entire depth of wound probed and visualized     Wound extent: no foreign bodies/material noted   Treatment:    Area cleansed with:  Saline and Betadine   Amount of cleaning:  Extensive   Irrigation method:  Syringe Skin repair:    Repair method:  Sutures   Suture size:  5-0   Suture material:  Prolene   Suture technique: 2 simple interupted, one corner  complex suture.   Number of sutures:  3 Approximation:    Approximation:  Close Post-procedure details:    Patient tolerance of procedure:  Tolerated well, no immediate complications  .Marland KitchenLaceration Repair  Date/Time: 01/24/2020 8:01 PM Performed by: Lorin Glass, PA-C Authorized by: Lorin Glass, PA-C   Consent:    Consent obtained:  Verbal   Consent given by:  Patient   Risks discussed:  Infection, need for additional repair, poor cosmetic result, pain, retained foreign body, tendon damage, vascular damage, poor wound healing and nerve damage   Alternatives discussed:  No treatment and referral (Alternative wound closures) Anesthesia (see MAR for exact dosages):    Anesthesia method:  None Laceration details:    Location:  Shoulder/arm   Shoulder/arm location:  R elbow   Length (cm):  3 Repair type:    Repair type:  Simple Pre-procedure details:    Preparation:  Imaging obtained to evaluate for foreign bodies Exploration:    Wound exploration: wound explored through full range of motion and entire depth of wound probed and visualized     Wound extent: no areolar tissue violation noted and no fascia violation noted   Treatment:    Area cleansed with:  Saline   Amount of cleaning:  Standard   Irrigation solution:  Sterile saline Skin repair:    Repair method:  Steri-Strips   Number of Steri-Strips:  5 Approximation:    Approximation:  Close Post-procedure details:    Dressing:  Non-adherent dressing   Patient tolerance of procedure:  Tolerated well, no immediate complications   (including critical care time)  Medications Ordered in ED Medications  iohexol (OMNIPAQUE) 350 MG/ML injection 100 mL (100 mLs Intravenous Contrast Given 01/24/20 1819)  lidocaine-EPINEPHrine-tetracaine (LET) topical gel (3 mLs Topical Given 01/24/20 1755)  lidocaine-EPINEPHrine (XYLOCAINE W/EPI) 1 %-1:100000 (with pres) injection 10 mL (10 mLs Infiltration Given by Other 01/24/20  1755)    ED Course  I have reviewed the triage vital signs and the nursing notes.  Pertinent labs & imaging results that were available during my care of the patient were reviewed by me and considered in my medical decision making (see chart for details).  Clinical Course as of Jan 24 1935  Fri Jan 24, 2020  1933 I discussed patient's results of her CT scan, normal troponins.  She had told Dr. Sedonia Small that earlier today she had chest pain.  She reports that she has had 2 falls in the past 2 weeks which is abnormal for her.  Her chest pains will last for 5 to 10 minutes, she did have them earlier this morning.  She states that her chest pain happened at rest.     [EH]    Clinical Course User Index [EH] Ollen Gross   MDM Rules/Calculators/A&P                         [name] [name] is a 86 year old woman who presents today for evaluation after a fall.  At about 3 AM this morning she was getting up and fell striking her head.  She does not take any blood thinning medications.  She reports pain in her head and her neck.  She is not a consistent historian, initially denied chest pain, then reported she did have chest pain.  Troponin x2 is not elevated.  Labs obtained and reviewed CBC and CMP are unremarkable.  D-dimer is elevated and she has history of PE.  Given her near syncope with chest pain CTA PE study is performed.  No evidence of acute PEs, chronic changes from her pulmonary fibrosis.  Her wound on her right elbow was repaired using Steri-Strips, x-rays did not show evidence of fracture.  Her wound on her forehead/scalp was sutured.  Review shows last tetanus was 2015.    Based on patient's chest pain with increasing frequency, often happening at rest, along with 2 falls in the past 2 weeks and chest pain prior to her fall today without clear mechanical cause concern for possible unstable angina/serious cause for her falls.  I spoke with hospitalist at [facility] who will admit  patient.   Orthostatic vitals ordered and 6 hour troponin.   Note: Portions of this report may have been transcribed using voice recognition software. Every effort was made to ensure accuracy; however, inadvertent computerized transcription errors may be present   Final Clinical Impression(s) / ED Diagnoses Final diagnoses:  Fall, initial encounter  Laceration of scalp without foreign body, initial encounter  Positive D dimer    Rx / DC Orders ED Discharge Orders    None        Ollen Gross 01/24/20 2003    Maudie Flakes, MD 01/27/20 [phone]

## 2020-01-24 NOTE — ED Triage Notes (Signed)
Pt c/o fall from standing x 6 hrs ago  c/o head injury. Denies LOC

## 2020-01-24 NOTE — ED Notes (Signed)
Patient was walked to the bathroom on RA.[name] When Patient was brought back to room Patient stated that she was on oxygen at home. Respiratory placed  Patient  on 2L [place] due to the oxygen saturations were 82% upon returning to room. Patient oxygen saturations increased to 96%.patiient placed on monitor.

## 2020-01-25 DIAGNOSIS — E43 Unspecified severe protein-calorie malnutrition: Secondary | ICD-10-CM | POA: Diagnosis present

## 2020-01-25 DIAGNOSIS — R079 Chest pain, unspecified: Secondary | ICD-10-CM

## 2020-01-25 DIAGNOSIS — R7989 Other specified abnormal findings of blood chemistry: Secondary | ICD-10-CM | POA: Diagnosis not present

## 2020-01-25 DIAGNOSIS — W01190A Fall on same level from slipping, tripping and stumbling with subsequent striking against furniture, initial encounter: Secondary | ICD-10-CM | POA: Diagnosis present

## 2020-01-25 DIAGNOSIS — M069 Rheumatoid arthritis, unspecified: Secondary | ICD-10-CM

## 2020-01-25 DIAGNOSIS — Z7952 Long term (current) use of systemic steroids: Secondary | ICD-10-CM | POA: Diagnosis not present

## 2020-01-25 DIAGNOSIS — J8410 Pulmonary fibrosis, unspecified: Secondary | ICD-10-CM | POA: Diagnosis not present

## 2020-01-25 DIAGNOSIS — R634 Abnormal weight loss: Secondary | ICD-10-CM

## 2020-01-25 DIAGNOSIS — Z888 Allergy status to other drugs, medicaments and biological substances status: Secondary | ICD-10-CM | POA: Diagnosis not present

## 2020-01-25 DIAGNOSIS — I5032 Chronic diastolic (congestive) heart failure: Secondary | ICD-10-CM | POA: Diagnosis present

## 2020-01-25 DIAGNOSIS — Z8249 Family history of ischemic heart disease and other diseases of the circulatory system: Secondary | ICD-10-CM | POA: Diagnosis not present

## 2020-01-25 DIAGNOSIS — Y92012 Bathroom of single-family (private) house as the place of occurrence of the external cause: Secondary | ICD-10-CM | POA: Diagnosis not present

## 2020-01-25 DIAGNOSIS — Z880 Allergy status to penicillin: Secondary | ICD-10-CM | POA: Diagnosis not present

## 2020-01-25 DIAGNOSIS — I2510 Atherosclerotic heart disease of native coronary artery without angina pectoris: Secondary | ICD-10-CM | POA: Diagnosis present

## 2020-01-25 DIAGNOSIS — W19XXXA Unspecified fall, initial encounter: Secondary | ICD-10-CM | POA: Diagnosis present

## 2020-01-25 DIAGNOSIS — E785 Hyperlipidemia, unspecified: Secondary | ICD-10-CM | POA: Diagnosis present

## 2020-01-25 DIAGNOSIS — Z7982 Long term (current) use of aspirin: Secondary | ICD-10-CM | POA: Diagnosis not present

## 2020-01-25 DIAGNOSIS — Z87891 Personal history of nicotine dependence: Secondary | ICD-10-CM | POA: Diagnosis not present

## 2020-01-25 DIAGNOSIS — J9611 Chronic respiratory failure with hypoxia: Secondary | ICD-10-CM | POA: Diagnosis present

## 2020-01-25 DIAGNOSIS — Z86718 Personal history of other venous thrombosis and embolism: Secondary | ICD-10-CM | POA: Diagnosis not present

## 2020-01-25 DIAGNOSIS — S51011A Laceration without foreign body of right elbow, initial encounter: Secondary | ICD-10-CM | POA: Diagnosis present

## 2020-01-25 DIAGNOSIS — Z820 Family history of epilepsy and other diseases of the nervous system: Secondary | ICD-10-CM | POA: Diagnosis not present

## 2020-01-25 DIAGNOSIS — E739 Lactose intolerance, unspecified: Secondary | ICD-10-CM | POA: Diagnosis present

## 2020-01-25 DIAGNOSIS — S0101XA Laceration without foreign body of scalp, initial encounter: Secondary | ICD-10-CM | POA: Diagnosis present

## 2020-01-25 DIAGNOSIS — M0510 Rheumatoid lung disease with rheumatoid arthritis of unspecified site: Secondary | ICD-10-CM | POA: Diagnosis present

## 2020-01-25 DIAGNOSIS — Z681 Body mass index (BMI) 19 or less, adult: Secondary | ICD-10-CM | POA: Diagnosis not present

## 2020-01-25 DIAGNOSIS — G4700 Insomnia, unspecified: Secondary | ICD-10-CM | POA: Diagnosis present

## 2020-01-25 DIAGNOSIS — Z86711 Personal history of pulmonary embolism: Secondary | ICD-10-CM | POA: Diagnosis not present

## 2020-01-25 DIAGNOSIS — Z79899 Other long term (current) drug therapy: Secondary | ICD-10-CM | POA: Diagnosis not present

## 2020-01-25 DIAGNOSIS — Z809 Family history of malignant neoplasm, unspecified: Secondary | ICD-10-CM | POA: Diagnosis not present

## 2020-01-25 DIAGNOSIS — Z96653 Presence of artificial knee joint, bilateral: Secondary | ICD-10-CM | POA: Diagnosis present

## 2020-01-25 DIAGNOSIS — Z20822 Contact with and (suspected) exposure to covid-19: Secondary | ICD-10-CM | POA: Diagnosis present

## 2020-01-25 LAB — URINALYSIS, ROUTINE W REFLEX MICROSCOPIC
Bilirubin Urine: NEGATIVE
Glucose, UA: NEGATIVE mg/dL
Ketones, ur: NEGATIVE mg/dL
Nitrite: NEGATIVE
Protein, ur: NEGATIVE mg/dL
Specific Gravity, Urine: 1.034 — ABNORMAL HIGH (ref 1.005–1.030)
WBC, UA: >50 WBC/hpf — ABNORMAL HIGH (ref 0–5)
pH: 6 (ref 5.0–8.0)

## 2020-01-25 LAB — IRON AND TIBC
Iron: 52 ug/dL (ref 28–170)
Saturation Ratios: 19 % (ref 10.4–31.8)
TIBC: 267 ug/dL (ref 250–450)
UIBC: 215 ug/dL

## 2020-01-25 LAB — VITAMIN B12: Vitamin B-12: 457 pg/mL (ref 180–914)

## 2020-01-25 LAB — TROPONIN I (HIGH SENSITIVITY)
Troponin I (High Sensitivity): 3 ng/L (ref <18)
Troponin I (High Sensitivity): 4 ng/L (ref <18)

## 2020-01-25 LAB — T4, FREE: Free T4: 0.94 ng/dL (ref 0.61–1.12)

## 2020-01-25 LAB — TSH: TSH: 5.379 u[IU]/mL — ABNORMAL HIGH (ref 0.350–4.500)

## 2020-01-25 LAB — FOLATE: Folate: 14 ng/mL (ref >5.9)

## 2020-01-25 LAB — MAGNESIUM: Magnesium: 1.5 mg/dL — ABNORMAL LOW (ref 1.7–2.4)

## 2020-01-25 LAB — HEMOGLOBIN A1C
Hgb A1c MFr Bld: 4.2 % — ABNORMAL LOW (ref 4.8–5.6)
Mean Plasma Glucose: 73.84 mg/dL

## 2020-01-25 LAB — FERRITIN: Ferritin: 524 ng/mL — ABNORMAL HIGH (ref 11–307)

## 2020-01-25 LAB — HIV ANTIBODY (ROUTINE TESTING W REFLEX): HIV Screen 4th Generation wRfx: NONREACTIVE

## 2020-01-25 MED ORDER — MYCOPHENOLATE MOFETIL 250 MG PO CAPS: 1000.0000 mg | ORAL_CAPSULE | Freq: Every day | ORAL | Status: DC

## 2020-01-25 MED ORDER — ADULT MULTIVITAMIN W/MINERALS CH
1.0000 | ORAL_TABLET | Freq: Every day | ORAL | Status: DC
  Administered 2020-01-25 – 2020-01-27 (×3): 1 via ORAL
  Filled 2020-01-25 (×3): qty 1

## 2020-01-25 MED ORDER — ALPRAZOLAM 0.25 MG PO TABS: 0.2500 mg | ORAL_TABLET | Freq: Every evening | ORAL | Status: DC | PRN

## 2020-01-25 MED ORDER — NINTEDANIB ESYLATE 150 MG PO CAPS: 1.0000 | ORAL_CAPSULE | Freq: Two times a day (BID) | ORAL | Status: DC

## 2020-01-25 MED ORDER — FLUTICASONE PROPIONATE 50 MCG/ACT NA SUSP
2.0000 | Freq: Every day | NASAL | Status: DC | PRN
  Filled 2020-01-25: qty 16

## 2020-01-25 MED ORDER — MIRTAZAPINE 15 MG PO TABS
15.0000 mg | ORAL_TABLET | Freq: Every day | ORAL | Status: DC
  Administered 2020-01-25 – 2020-01-26 (×2): 15 mg via ORAL
  Filled 2020-01-25 (×2): qty 1

## 2020-01-25 MED ORDER — POLYVINYL ALCOHOL 1.4 % OP SOLN
1.0000 [drp] | Freq: Every day | OPHTHALMIC | Status: DC | PRN
  Filled 2020-01-25: qty 15

## 2020-01-25 MED ORDER — ASPIRIN EC 81 MG PO TBEC
81.0000 mg | DELAYED_RELEASE_TABLET | Freq: Every day | ORAL | Status: DC
  Administered 2020-01-25 – 2020-01-27 (×3): 81 mg via ORAL
  Filled 2020-01-25 (×3): qty 1

## 2020-01-25 MED ORDER — MYCOPHENOLATE MOFETIL 250 MG PO CAPS
1000.0000 mg | ORAL_CAPSULE | Freq: Two times a day (BID) | ORAL | Status: DC
  Administered 2020-01-25: 1000 mg via ORAL
  Filled 2020-01-25 (×2): qty 4

## 2020-01-25 MED ORDER — ENOXAPARIN SODIUM 30 MG/0.3ML ~~LOC~~ SOLN
30.0000 mg | SUBCUTANEOUS | Status: DC
  Administered 2020-01-25 – 2020-01-26 (×2): 30 mg via SUBCUTANEOUS
  Filled 2020-01-25 (×2): qty 0.3

## 2020-01-25 MED ORDER — ENSURE ENLIVE PO LIQD
237.0000 mL | Freq: Two times a day (BID) | ORAL | Status: DC
  Administered 2020-01-25: 237 mL via ORAL

## 2020-01-25 MED ORDER — MAGNESIUM OXIDE 400 (241.3 MG) MG PO TABS
400.0000 mg | ORAL_TABLET | Freq: Two times a day (BID) | ORAL | Status: AC
  Administered 2020-01-25 (×2): 400 mg via ORAL
  Filled 2020-01-25 (×2): qty 1

## 2020-01-25 MED ORDER — ORAL CARE MOUTH RINSE
15.0000 mL | Freq: Two times a day (BID) | OROMUCOSAL | Status: DC
  Administered 2020-01-26 (×2): 15 mL via OROMUCOSAL

## 2020-01-25 MED ORDER — PREDNISONE 5 MG PO TABS
5.0000 mg | ORAL_TABLET | Freq: Every day | ORAL | Status: DC
  Administered 2020-01-25 – 2020-01-27 (×3): 5 mg via ORAL
  Filled 2020-01-25 (×3): qty 1

## 2020-01-25 MED ORDER — ENOXAPARIN SODIUM 40 MG/0.4ML ~~LOC~~ SOLN: 40.0000 mg | SUBCUTANEOUS | Status: DC

## 2020-01-25 MED ORDER — MYCOPHENOLATE MOFETIL 250 MG PO CAPS
500.0000 mg | ORAL_CAPSULE | Freq: Every evening | ORAL | Status: DC
  Administered 2020-01-25 – 2020-01-26 (×2): 500 mg via ORAL
  Filled 2020-01-25 (×3): qty 2

## 2020-01-25 MED ORDER — ATORVASTATIN CALCIUM 40 MG PO TABS
40.0000 mg | ORAL_TABLET | Freq: Every day | ORAL | Status: DC
  Administered 2020-01-25 – 2020-01-27 (×3): 40 mg via ORAL
  Filled 2020-01-25 (×3): qty 1

## 2020-01-25 MED ORDER — MYCOPHENOLATE MOFETIL 250 MG PO CAPS
1000.0000 mg | ORAL_CAPSULE | Freq: Every day | ORAL | Status: DC
  Administered 2020-01-26 – 2020-01-27 (×2): 1000 mg via ORAL
  Filled 2020-01-25 (×2): qty 4

## 2020-01-25 MED ORDER — DAPSONE 100 MG PO TABS
100.0000 mg | ORAL_TABLET | Freq: Every day | ORAL | Status: DC
  Administered 2020-01-25 – 2020-01-27 (×3): 100 mg via ORAL
  Filled 2020-01-25 (×4): qty 1

## 2020-01-25 MED ORDER — LEVALBUTEROL HCL 1.25 MG/0.5ML IN NEBU: 1.2500 mg | INHALATION_SOLUTION | Freq: Four times a day (QID) | RESPIRATORY_TRACT | Status: DC | PRN

## 2020-01-25 MED ORDER — METOPROLOL SUCCINATE ER 25 MG PO TB24
25.0000 mg | ORAL_TABLET | Freq: Every day | ORAL | Status: DC
  Administered 2020-01-25 – 2020-01-27 (×3): 25 mg via ORAL
  Filled 2020-01-25 (×3): qty 1

## 2020-01-25 MED ORDER — LATANOPROST 0.005 % OP SOLN
1.0000 [drp] | Freq: Every day | OPHTHALMIC | Status: DC
  Administered 2020-01-25 – 2020-01-26 (×2): 1 [drp] via OPHTHALMIC
  Filled 2020-01-25: qty 2.5

## 2020-01-25 MED ORDER — NITROGLYCERIN 0.4 MG SL SUBL: 0.4000 mg | SUBLINGUAL_TABLET | SUBLINGUAL | Status: DC | PRN

## 2020-01-25 NOTE — H&P (Signed)
[facility] History and Physical  [name] [name] GMW:[id number] DOB: 1930-12-28 DOA: 01/24/2020  Referring EDP: Phylliss Bob PCP: Hoyt Koch, MD   Chief Complaint: Fall  HPI: [name] [name] is a 86 y.o. female with PMH of pulmonary fibrosis, RA ILD, chronic hypoxic respiratory failure, CAD, HLD presented to [facility] after a fall and admitted to [facility] for ACS rule-out and further workup.  Patient reports that she got up to use the restroom the night prior around 0200 and fell in the bathroom and hit her head on the edge of the marble counter. Denies LOC and does not take a blood thinner. Patient with ED visit on 6/19 after tripping over oxygen tubing. Last night, patient unsure why she fell. Reports she recently had her large toenails removed and her shoes fit differently. Does report possible chest pain prior to the incident but unable to give exact history on this. Reports that for the last several months she will intermittently have chest pain after exertion that only lasts for a few seconds and located over left side of chest; non-radiating, non-pleuritic. Also reports that she thinks her fall could be related to her CellCept. Patient is also very concerned about her weight. Reports that she now weighs about 103 lbs and used to weigh around 140 lbs two years prior. Reports decreased appetite and that she has to make herself eat. She sometimes does have night sweats. Denies palpitations prior to fall. She was constipated a few days ago and took Linzess which improved constipation. She lives alone. Denies headache, dizziness, fever, chills, cough, SOB, abdominal pain, nausea, vomiting, dysuria, hematuria, hematochezia, melena, difficulty moving arms/legs, speech difficulty, confusion or any other complaints.  In the outside ED: Mostly borderline low BP's but overall stable on 2L O2. Labs remarkable for an elevated D-dimer however CTA Chest neg for PE. Hgb 11.8, UA with trace leuks and positive  nitrites. Trop 3>3>3. CT Head, C-Spine, Right Elbow, CXR and CTA Chest without acute findings. Transfer requested for ACS rule-out.   Review of Systems:  All other systems negative unless noted above in HPI.   Past Medical History:  Diagnosis Date  . Allergic rhinitis   . Allergy   . CAD (coronary artery disease)    Mild CAD by cath 2008  . DJD (degenerative joint disease)    rheumatoid  . DVT (deep venous thrombosis) ([place])   . Dyslipidemia   . History of echocardiogram    Echo 12/2018: EF 60-65, mod asymmetric LVH, Gr 1 DD  . History of nuclear stress test    Myoview 5/17:  EF 74%, normal perfusion, low risk // Myoview 12/2018:  EF 89, very mild ischemia in inferoapical wall; Low Risk  . History of pulmonary embolism   . Hyperlipidemia   . Insomnia   . Osteoporosis   . Psoriasis   . Pulmonary fibrosis ([place])   . Rheumatoid arthritis [facility])    Past Surgical History:  Procedure Laterality Date  . ABDOMINAL HYSTERECTOMY  1974  . APPENDECTOMY  1959  . CATARACT EXTRACTION    . LEFT HEART CATHETERIZATION WITH CORONARY ANGIOGRAM N/A 06/18/2013   Procedure: LEFT HEART CATHETERIZATION WITH CORONARY ANGIOGRAM;  Surgeon: Peter M Martinique, MD;  Location: [facility] CATH LAB;  Service: Cardiovascular;  Laterality: N/A;  . TONSILLECTOMY  1941, 1951  . TOTAL KNEE ARTHROPLASTY  1997, 2007   Bilateral   Social History:  reports that she quit smoking about 46 years ago. Her smoking use included cigarettes. She has a  10.00 pack-year smoking history. She has never used smokeless tobacco. She reports that she does not drink alcohol and does not use drugs.  Allergies  Allergen Reactions  . Crestor [Rosuvastatin] Other (See Comments)    Muscle pain  . Lactose Intolerance (Gi) Other (See Comments)    Stomach upset  . Penicillins Hives  . Imuran [Azathioprine] Nausea And Vomiting  . Niacin Hives    Family History  Problem Relation Age of Onset  . Kidney disease Daughter 5  . Cancer Daughter   .  Heart attack Father 46  . Alzheimer's disease Sister   . Heart disease Sister   . Alzheimer's disease Sister     Prior to Admission medications   Medication Sig Start Date End Date Taking? Authorizing Provider  ALPRAZolam (XANAX) 0.25 MG tablet Take 1 tablet (0.25 mg total) by mouth at bedtime as needed for anxiety. 04/23/18   Hoyt Koch, MD  aspirin EC 81 MG tablet Take 1 tablet (81 mg total) by mouth daily. 11/30/15   Richardson Dopp T, PA-C  atorvastatin (LIPITOR) 40 MG tablet Take 1 tablet (40 mg total) by mouth daily. 04/19/19   Burnell Blanks, MD  dapsone 100 MG tablet TAKE 1 TABLET(100 MG) BY MOUTH DAILY 12/03/19   Mannam, Praveen, MD  fluticasone (FLONASE) 50 MCG/ACT nasal spray Place 2 sprays into both nostrils daily as needed for allergies or rhinitis. 03/17/15   Hoyt Koch, MD  furosemide (LASIX) 20 MG tablet Take 1 tablet by mouth daily as needed for swelling. 12/11/18   Burtis Junes, NP  hydrocortisone (ANUSOL-HC) 2.5 % rectal cream Place 1 application rectally 2 (two) times daily. 09/13/18   Hoyt Koch, MD  InFLIXimab (REMICADE IV) Inject into the vein.    [provider]  latanoprost (XALATAN) 0.005 % ophthalmic solution Place 1 drop into both eyes at bedtime.  09/06/14   [provider]  levalbuterol Penne Lash HFA) 45 MCG/ACT inhaler Inhale 2 puffs into the lungs every 6 (six) hours as needed for wheezing. 06/05/17   Magdalen Spatz, NP  metoprolol succinate (TOPROL XL) 25 MG 24 hr tablet Take 1 tablet (25 mg total) by mouth daily. 12/12/18   Richardson Dopp T, PA-C  mirtazapine (REMERON) 15 MG tablet Take 1 tablet (15 mg total) by mouth at bedtime. 07/18/19   Hoyt Koch, MD  mycophenolate (CELLCEPT) 500 MG tablet 2 tabs bid 11/15/19   Mannam, Hart Robinsons, MD  nitrofurantoin, macrocrystal-monohydrate, (MACROBID) 100 MG capsule Take 1 capsule (100 mg total) by mouth 2 (two) times daily. 01/06/20   Hoyt Koch, MD    nitroGLYCERIN (NITROSTAT) 0.4 MG SL tablet Place 1 tablet (0.4 mg total) under the tongue every 5 (five) minutes as needed for chest pain. 12/12/18 09/19/19  Weaver, Scott T, PA-C  OFEV 150 MG CAPS TAKE 1 CAPSULE TWICE A DAY 11/11/19   Mannam, Praveen, MD  Polyethyl Glycol-Propyl Glycol (SYSTANE) 0.4-0.3 % SOLN Apply 1 drop to eye daily as needed (for dry eyes).    [provider]  polyethylene glycol powder (GLYCOLAX/MIRALAX) powder Take 17 g by mouth 2 (two) times daily as needed. 09/13/18   Hoyt Koch, MD  predniSONE (DELTASONE) 5 MG tablet Take 1 tablet (5 mg total) by mouth daily with breakfast. 12/03/19   Mannam, Hart Robinsons, MD  silver sulfADIAZINE (SILVADENE) 1 % cream Apply 1 application topically daily. 02/11/19   Hoyt Koch, MD  triamcinolone cream (KENALOG) 0.1 % Apply 1 application topically  2 (two) times daily. 04/23/18   Hoyt Koch, MD  valACYclovir (VALTREX) 1000 MG tablet TAKE 1 TABLET TWICE A DAY Patient taking differently: Take 1,000 mg by mouth 2 (two) times daily as needed.  05/09/19   Hoyt Koch, MD   Physical Exam: Vitals:   01/25/20 0100 01/25/20 0200 01/25/20 0358 01/25/20 0400  BP: 103/66 110/68 95/65   Pulse: 68 70 62   Resp: (!) 22 (!) 24 16   Temp:  98.2 F (36.8 C) 98.1 F (36.7 C)   TempSrc:  Oral Oral   SpO2: 98% 97% 97%   Weight:    46.7 kg  Height:    5' 4.5" (1.638 m)   Wt Readings from Last 10 Encounters:  01/25/20 46.7 kg  01/03/20 47.6 kg  12/28/19 47.6 kg  09/19/19 50.9 kg  09/06/19 51.4 kg  05/24/19 52 kg  01/10/19 53 kg  12/27/18 55.3 kg  12/12/18 55.7 kg  12/11/18 54.4 kg   . General:  Appears calm and comfortable. AAOx4. Appears thin and frail. . Eyes: EOMI, normal lids, irises & conjunctiva . ENT: grossly normal hearing, lips & tongue . Neck: normal ROM . Cardiovascular: RRR, no m/r/g. No LE edema. Bilateral lower extremities tender to palpation with light touch, no evidence of  DVT. [name] Respiratory: CTA bilaterally, no w/r/r. Normal respiratory effort. . Abdomen: soft, ntnd . Skin: no rash or induration seen on limited exam, small abrasion on midline/forhead . Musculoskeletal: grossly normal tone BUE/BLE . Psychiatric: grossly normal mood and affect, speech fluent and appropriate, pleasant . Neurologic: CN 2-12 intact          Labs on Admission:  Basic Metabolic Panel: Recent Labs  Lab 01/24/20 1632  NA 138  K 3.6  CL 101  CO2 27  GLUCOSE 88  BUN 10  CREATININE 0.57  CALCIUM 9.0   Liver Function Tests: Recent Labs  Lab 01/24/20 1632  AST 23  ALT 17  ALKPHOS 73  BILITOT 1.0  PROT 7.3  ALBUMIN 3.6   No results for input(s): LIPASE, AMYLASE in the last 168 hours. No results for input(s): AMMONIA in the last 168 hours. CBC: Recent Labs  Lab 01/24/20 1632  WBC 9.5  NEUTROABS 3.9  HGB 11.8*  HCT 38.1  MCV 100.5*  PLT 209   Cardiac Enzymes: No results for input(s): CKTOTAL, CKMB, CKMBINDEX, TROPONINI in the last 168 hours.  BNP (last 3 results) No results for input(s): BNP in the last 8760 hours.  ProBNP (last 3 results) No results for input(s): PROBNP in the last 8760 hours.  CBG: No results for input(s): GLUCAP in the last 168 hours.  Radiological Exams on Admission: DG Chest 2 View  Result Date: 01/24/2020 CLINICAL DATA:  Chest pain, syncope. EXAM: CHEST - 2 VIEW COMPARISON:  June 18, 2018. FINDINGS: The heart size and mediastinal contours are within normal limits. No pneumothorax is noted. Stable reticular densities are noted throughout both lungs most consistent with pulmonary fibrosis. No definite acute abnormality is noted. No significant pleural effusion is noted. The visualized skeletal structures are unremarkable. IMPRESSION: Stable bilateral pulmonary fibrosis. No significant changes noted compared to prior exam. Electronically Signed   By: Marijo Conception M.D.   On: 01/24/2020 16:33   DG Elbow Complete Right  Result  Date: 01/24/2020 CLINICAL DATA:  Fall, right elbow pain EXAM: RIGHT ELBOW - COMPLETE 3+ VIEW COMPARISON:  None. FINDINGS: Four view radiograph right elbow demonstrates normal alignment. No fracture or dislocation.  No effusion. Soft tissues are unremarkable. IMPRESSION: Negative. Electronically Signed   By: Fidela Salisbury MD   On: 01/24/2020 15:42   CT Head Wo Contrast  Result Date: 01/24/2020 CLINICAL DATA:  Headache, posttraumatic. Neck trauma. Additional history provided: Fall, forehead laceration. EXAM: CT HEAD WITHOUT CONTRAST CT CERVICAL SPINE WITHOUT CONTRAST TECHNIQUE: Multidetector CT imaging of the head and cervical spine was performed following the standard protocol without intravenous contrast. Multiplanar CT image reconstructions of the cervical spine were also generated. COMPARISON:  Head CT 12/28/2019 FINDINGS: CT HEAD FINDINGS Brain: Stable, mild generalized parenchymal atrophy and chronic small vessel ischemic disease. There is no acute intracranial hemorrhage. No demarcated cortical infarct. No extra-axial fluid collection. No evidence of intracranial mass. No midline shift. Vascular: No hyperdense vessel.  Atherosclerotic calcifications. Skull: Normal. Negative for fracture or focal lesion. Sinuses/Orbits: Visualized orbits show no acute finding. Scattered mucosal thickening and fluid within ethmoid air cells. Left sphenoid sinus air-fluid level. No significant mastoid effusion. Other: Frontal scalp laceration. CT CERVICAL SPINE FINDINGS Alignment: No significant spondylolisthesis. Skull base and vertebrae: The basion-dental and atlanto-dental intervals are maintained.No evidence of acute fracture to the cervical spine. Soft tissues and spinal canal: No prevertebral fluid or swelling. No visible canal hematoma. Disc levels: Cervical spondylosis with multilevel disc space narrowing, posterior disc osteophytes, uncovertebral and facet hypertrophy. No high-grade bony spinal canal stenosis or  neural foraminal narrowing. Upper chest: No consolidation within the imaged lung apices. No visible pneumothorax. Biapical pleuroparenchymal scarring. IMPRESSION: CT head: 1. No evidence of acute intracranial abnormality. Midline frontal scalp laceration. 2. Stable mild generalized parenchymal atrophy and chronic small vessel ischemic disease. 3. Ethmoid and left sphenoid sinusitis. CT cervical spine: 1. No evidence of acute fracture to the cervical spine. 2. Cervical spondylosis as described. Electronically Signed   By: Kellie Simmering DO   On: 01/24/2020 15:36   CT Angio Chest PE W/Cm &/Or Wo Cm  Result Date: 01/24/2020 CLINICAL DATA:  Elevated D-dimer, fell this morning, previous tobacco abuse, chest pain, syncope EXAM: CT ANGIOGRAPHY CHEST WITH CONTRAST TECHNIQUE: Multidetector CT imaging of the chest was performed using the standard protocol during bolus administration of intravenous contrast. Multiplanar CT image reconstructions and MIPs were obtained to evaluate the vascular anatomy. CONTRAST:  157m OMNIPAQUE IOHEXOL 350 MG/ML SOLN COMPARISON:  01/24/2020, 05/30/2019 FINDINGS: Cardiovascular: This is a technically adequate evaluation of the pulmonary vasculature. No filling defects or pulmonary emboli. The heart is unremarkable without pericardial effusion. Stable ectasia of the thoracic aorta, with mild dilatation of the ascending aorta measuring 3.3 cm. No dissection. Mediastinum/Nodes: No enlarged mediastinal, hilar, or axillary lymph nodes. Thyroid gland, trachea, and esophagus demonstrate no significant findings. Lungs/Pleura: Extensive changes of emphysema and pulmonary fibrosis are again noted. No acute airspace disease, effusion, or pneumothorax. The central airways are patent. Upper Abdomen: No acute abnormality. Musculoskeletal: No acute or destructive bony lesions. Reconstructed images demonstrate no additional findings. Review of the MIP images confirms the above findings. IMPRESSION: 1. No  evidence of pulmonary embolus. 2. Stable changes of emphysema and pulmonary fibrosis. No acute airspace disease. Electronically Signed   By: MRanda NgoM.D.   On: 01/24/2020 19:05   CT Cervical Spine Wo Contrast  Result Date: 01/24/2020 CLINICAL DATA:  Headache, posttraumatic. Neck trauma. Additional history provided: Fall, forehead laceration. EXAM: CT HEAD WITHOUT CONTRAST CT CERVICAL SPINE WITHOUT CONTRAST TECHNIQUE: Multidetector CT imaging of the head and cervical spine was performed following the standard protocol without intravenous contrast. Multiplanar CT image reconstructions of  the cervical spine were also generated. COMPARISON:  Head CT 12/28/2019 FINDINGS: CT HEAD FINDINGS Brain: Stable, mild generalized parenchymal atrophy and chronic small vessel ischemic disease. There is no acute intracranial hemorrhage. No demarcated cortical infarct. No extra-axial fluid collection. No evidence of intracranial mass. No midline shift. Vascular: No hyperdense vessel.  Atherosclerotic calcifications. Skull: Normal. Negative for fracture or focal lesion. Sinuses/Orbits: Visualized orbits show no acute finding. Scattered mucosal thickening and fluid within ethmoid air cells. Left sphenoid sinus air-fluid level. No significant mastoid effusion. Other: Frontal scalp laceration. CT CERVICAL SPINE FINDINGS Alignment: No significant spondylolisthesis. Skull base and vertebrae: The basion-dental and atlanto-dental intervals are maintained.No evidence of acute fracture to the cervical spine. Soft tissues and spinal canal: No prevertebral fluid or swelling. No visible canal hematoma. Disc levels: Cervical spondylosis with multilevel disc space narrowing, posterior disc osteophytes, uncovertebral and facet hypertrophy. No high-grade bony spinal canal stenosis or neural foraminal narrowing. Upper chest: No consolidation within the imaged lung apices. No visible pneumothorax. Biapical pleuroparenchymal scarring.  IMPRESSION: CT head: 1. No evidence of acute intracranial abnormality. Midline frontal scalp laceration. 2. Stable mild generalized parenchymal atrophy and chronic small vessel ischemic disease. 3. Ethmoid and left sphenoid sinusitis. CT cervical spine: 1. No evidence of acute fracture to the cervical spine. 2. Cervical spondylosis as described. Electronically Signed   By: Kellie Simmering DO   On: 01/24/2020 15:36    EKG: Independently reviewed. HR 70. Sinus rhythm. QTc 426. No STEMI.  Assessment/Plan Principal Problem:   Fall Active Problems:   Dyslipidemia   Personal history of DVT (deep vein thrombosis)   Postinflammatory pulmonary fibrosis / RA ILD    Chest pain   CAD (coronary artery disease)   RA (rheumatoid arthritis) ([facility])   Chronic respiratory failure with hypoxia ([facility])   Unintentional weight loss  86 y.o. female with PMH of pulmonary fibrosis, RA ILD, chronic hypoxic respiratory failure, CAD, HLD presented to [facility] after a fall and admitted to [facility] for ACS rule-out and further workup.  Fall - now with 2 falls in last month - last fall in June after tripping over tubing and fall this time with unclear etiology - will rule-out ACS as below, possibly medication related vs mechanical vs infection vs debility  - hold Metoprolol; BP's and pulse borderline low - Orthostatic Vital signs - Obtain Echo - Hemoccult for stool to be collected  - PT/OT - F/u urine culture   Chest Pain - Reports that for the last several months she will intermittently have chest pain after exertion that only lasts for a few seconds and located over left side of chest; non-radiating, non-pleuritic - Will trend one more Trop later this morning; negative thus far - Tele - CTA Chest neg for PE - Consult Cards PRN - Echo ordered - Daily Aspirin  Unintentional Weight Loss - patient very concerned about her weight and reports 40 lb weight loss in last 2 years - On 12/12/2018, records do indicate patient weight  55 kg - reports screening up to date at appropriate times previously - will draw iron panel, Folate, TSH/free T4, HIV, Hep C, Mag and Phos - cont Mirtazapine - Dietician consult - ESR/CRP less likely to be helpful in setting of chronic illnesses   Pulmonary fibrosis RA ILD Chronic hypoxic respiratory failure - cont home 2L - cont CellCept and [place] - last saw Pulm 4/26 - cont inhalers  HLD - cont statin  Insomnia - cont PRN Xanax qhs  - consider discontinuation  due to risk of falls   Code Status: Full DVT Prophylaxis: Lovenox Family Communication: None Disposition Plan: Admit to inpatient. Patient requires ACS rule-out but also with extensive weight loss without reason. Requires PT/OT/Dietician consults. Possible discharge back home in 2-3 days but could need placement in rehab pending recommendations.    Time spent: 70 minutes  Chauncey Mann, MD [facility] Pager [phone]

## 2020-01-25 NOTE — Evaluation (Signed)
Occupational Therapy Evaluation Patient Details Name: [name] [name] MRN: [id number] DOB: 27-Jan-1931 Today's Date: 01/25/2020    History of Present Illness [name] [name] is an 86 y.o. female tree of pulmonary fibrosis, rheumatoid arthritis with interstitial lung disease with chronic respiratory failure with hypoxia on home O2 who presents after a mechanical fall in bathroom, CT of spine and head neg. Pt also reports 30 lb wt loss over past 2 yrs.    Clinical Impression   Pt PTA: Pt living alone, but has PCS 2 days a week mostly for IADLs. Pt currently, supervisionA for walk-in shower transfer using grab bars for safe transfer and no physical assist required. Pt currently, supervisionA- modified independence for ADL in sitting versus standing. Pt's BP remained low throughout session 85/56 moments before OT arrived in sitting; and BP after exertion 91/54. Pt's HR 76 BPM and O2 >90% on 2L O2. Pt able to maneuver to closet with supervisionA to HHA of 1 to closet to look for ADL supplies. Pt with questionable cognitive deficits for medication management. Pt would benefit from continued OT skilled services for ADL, mobility and safety in [place] setting. OT following acutely.      Follow Up Recommendations  Home health OT;Supervision - Intermittent (for medication management strategies and ADL safety)    Equipment Recommendations  3 in 1 bedside commode    Recommendations for Other Services       Precautions / Restrictions Precautions Precautions: Fall Restrictions Weight Bearing Restrictions: No      Mobility Bed Mobility Overal bed mobility: Modified Independent             General bed mobility comments: in recliner pre and post session  Transfers Overall transfer level: Needs assistance Equipment used: None Transfers: Sit to/from Stand;Stand Pivot Transfers Sit to Stand: Min guard Stand pivot transfers: Min guard       General transfer comment: MinguardA overall     Balance Overall balance assessment: Mild deficits observed, not formally tested                                         ADL either performed or assessed with clinical judgement   ADL Overall ADL's : At baseline                                       General ADL Comments: Pt supervisionA for walk-in shower transfer using grab bars for safe transfer and no physical assist required. Pt currently, supervisionA- modified independence for ADL in sitting versus standing. Pt's BP remained low throughout session 85/56 moments before OT arrived in sitting; and BP after exertion 91/54. Pt's HR 76 BPM and O2 >90% on 2L O2. Pt able to maneuver to closet with supervisionA to HHA of 1 to closet to look for ADL supplies.      Vision Baseline Vision/History: Wears glasses Wears Glasses: At all times Patient Visual Report: No change from baseline Vision Assessment?: No apparent visual deficits     Perception     Praxis      Pertinent Vitals/Pain Pain Assessment: Faces Faces Pain Scale: Hurts little more Pain Location: B shins with knee extension and to touch Pain Descriptors / Indicators: Guarding;Grimacing;Sore Pain Intervention(s): Limited activity within patient's tolerance;Monitored during session     Hand Dominance Right  Extremity/Trunk Assessment Upper Extremity Assessment Upper Extremity Assessment: Overall WFL for tasks assessed   Lower Extremity Assessment Lower Extremity Assessment: Generalized weakness;Defer to PT evaluation   Cervical / Trunk Assessment Cervical / Trunk Assessment: Kyphotic   Communication Communication Communication: No difficulties   Cognition Arousal/Alertness: Awake/alert Behavior During Therapy: WFL for tasks assessed/performed Overall Cognitive Status: Within Functional Limits for tasks assessed                                 General Comments: Pt forgetting often to take medicines.   General  Comments  Energy conservation strategies performed and education for pill box test management. Pt's husband and children are deceased but she has good support from numerous nieces and nephews    Exercises Exercises: General Lower Extremity General Exercises - Lower Extremity Ankle Circles/Pumps: AROM;Both;Seated;Supine;15 reps Long Arc Quad: AROM;Both;10 reps;Seated Heel Slides: AROM;Both;5 reps;Supine   Shoulder Instructions      Home Living Family/patient expects to be discharged to:: Private residence Living Arrangements: Alone Available Help at Discharge: Personal care attendant Type of Home: House Home Access: Stairs to enter [employer] of Steps: 2   Home Layout: One level     Bathroom Shower/Tub: Occupational psychologist: Handicapped height     [place]: [place] - single point          Prior Functioning/Environment Level of Independence: Needs assistance  Gait / Transfers Assistance Needed: independent with ambulation ADL's / [place] Needed: independent with bathing and dressing. Has an aide that comes 2x/wk to help manage her house. The aide has offered to come more and pt relays that she could have 24/7 assist if needed.    Comments: Personal care attendant  a few hours 2 days/week. Both times that pt has fallen have been in middle of night getting up to bathroom. First time she tripped on O2 tubing. This time she doesn't know why.         OT Problem List: Decreased cognition;Cardiopulmonary status limiting activity;Decreased activity tolerance      OT Treatment/Interventions: Self-care/ADL training;Therapeutic exercise;DME and/or AE instruction;Therapeutic activities;Cognitive remediation/compensation;Patient/family education;Balance training    OT Goals(Current goals can be found in the care plan section) Acute Rehab OT Goals Patient Stated Goal: return home OT Goal Formulation: With patient Time For Goal Achievement:  02/08/20 Potential to Achieve Goals: Good ADL Goals Additional ADL Goal #1: Pt will perform pillbox test in order to assess safety and higher level cognition and pass the test.  OT Frequency: Min 2X/week   Barriers to D/C:            Co-evaluation              AM-PAC OT "6 Clicks" Daily Activity     Outcome Measure Help from another person eating meals?: None Help from another person taking care of personal grooming?: None Help from another person toileting, which includes using toliet, bedpan, or urinal?: A Little Help from another person bathing (including washing, rinsing, drying)?: A Little Help from another person to put on and taking off regular upper body clothing?: None Help from another person to put on and taking off regular lower body clothing?: A Little 6 Click Score: 21   End of Session Nurse Communication: Mobility status  Activity Tolerance: Patient tolerated treatment well Patient left: in chair;with call bell/phone within reach;with chair alarm set  OT Visit Diagnosis: Unsteadiness on feet (R26.81);Muscle  weakness (generalized) (M62.81);Other symptoms and signs involving cognitive function                Time: 1529-1600 OT Time Calculation (min): 31 min Charges:  OT General Charges $OT Visit: 1 Visit OT Evaluation $OT Eval Moderate Complexity: 1 Mod OT Treatments $Self Care/Home Management : 8-22 mins  Jefferey Pica, OTR/L Acute Rehabilitation Services Pager: [phone] Office: [phone]   [name] [name] C 01/25/2020, 4:19 PM

## 2020-01-25 NOTE — Progress Notes (Addendum)
Initial Nutrition Assessment  DOCUMENTATION CODES:   Underweight  INTERVENTION:   Ensure Enlive po BID, each supplement provides 350 kcal and 20 grams of protein. Pt likes Ensure mixed with ice cream as a milkshake, RN can mix as needed it pt requests  Downgrade diet to Dysphagia 3 (easy to chew)  Add MVI with Minerals  Pt would likely benefit from smaller, more frequent meals; discussed increasing meal intake to 3 times per day with addition of Ensure milkshakes at home post discharge to promote weight gain  NUTRITION DIAGNOSIS:   Inadequate oral intake related to poor appetite as evidenced by per patient/family report, percent weight loss.  GOAL:   Patient will meet greater than or equal to 90% of their needs  MONITOR:   PO intake, Supplement acceptance, Labs, Weight trends  REASON FOR ASSESSMENT:   Consult Assessment of nutrition requirement/status  ASSESSMENT:   86 yo female admitted post fall with chest pain  Pt also with unintentionaln wt loss of unknown etiology with workup ongoing.  PMH includes pulmonary fibrosis, RA ILD,chronic respiratory failure on home oxygen,  CAD, HL  RD working remotely.  Spoke with patient via telephone. Pt reports she has no appetite and has to force herself to eat. Lately she makes her self eat 2 meals per day. She has been trying to incorporate protein in her diet including beans and meat but reports she is not a fan of meat. Pt also knows she needs to increase her meal intake to 3 times per day.  Pt has been started on remeron prior to admission but is unsure if it is helping.   Pt sipping on Milkshake made with ice cream and Ensure during conversation today. Pt likes this very much and plans to continue at home  Pt reports she wears dentures but reports that do not fit well since losing weight. The dentures rub her gums and it is painful; it also makes it difficult to chew foods like tough meats and salads. Discussed providing patient  with easy to chew diet with pt, pt in agreement.    Current weight 46.7 kg; pt is underweight based on BMI. Weight has trended down over the past year per weight encounters. Around 15% wt loss in 1 year. Pt reports maybe 30 pound wt loss in 1 to 2 years; pt reports she has dropped from a size 16 to size 6 in pants.   Pt is a very high nutrition risk, pt likely has some degree of malnutrition present. Plan to perform Nutrition Focused Physical Exam  Labs: HgbA1c 4.2 (wdl), B12 457 (wdl), TSH 5.4 (H), T4 wdl, folate 14 (wdl) Meds: remeron, prednisone  NUTRITION - FOCUSED PHYSICAL EXAM:  Deferred, attempt on follow-up  Diet Order:  REGULAR  EDUCATION NEEDS:   Education needs have been addressed  Skin:  Skin Assessment: Skin Integrity Issues: Skin Integrity Issues:: Other (Comment) Other: laceration to head post fall  Last BM:  PTA  Height:   Ht Readings from Last 1 Encounters:  01/25/20 5' 4.5" (1.638 m)    Weight:   Wt Readings from Last 1 Encounters:  01/25/20 46.7 kg    BMI:  Body mass index is 17.41 kg/m.  Estimated Nutritional Needs:   Kcal:  1650-1860 kcals  Protein:  80-90 g  Fluid:  >/= 1.6 L   Kerman Passey MS, RDN, LDN, CNSC Registered Dietitian III Clinical Nutrition RD Pager and On-Call Pager Number Located in [place]

## 2020-01-25 NOTE — Progress Notes (Signed)
Pt arrived on the unit. AOx4.  VS taken. Placed on tele monitor and low bed. MD came to see pt. Will monitor.

## 2020-01-25 NOTE — Evaluation (Signed)
Physical Therapy Evaluation Patient Details Name: [name] [name] MRN: [id number] DOB: 1931-06-05 Today's Date: 01/25/2020   History of Present Illness  [name] [name] is an 86 y.o. female tree of pulmonary fibrosis, rheumatoid arthritis with interstitial lung disease with chronic respiratory failure with hypoxia on home O2 who presents after a mechanical fall in bathroom, CT of spine and head neg. Pt also reports 30 lb wt loss over past 2 yrs.   Clinical Impression  Pt admitted with above diagnosis. Pt relays that both recent falls have occurred in the middle of the night when going to the bathroom, given hypotension and getting up at night, recommend pt use BSC for nighttime urination. She reports she can have increased caregiving at home as well, at this point would recommend 24/7. Pt ambulated 30' with min A.  Pt currently with functional limitations due to the deficits listed below (see PT Problem List). Pt will benefit from skilled PT to increase their independence and safety with mobility to allow discharge to the venue listed below.   BP supine 93/55 Sitting 97/61 Standing 0 min 95/60 Standing 3 mins 98/68 HR and SpO2 stable on RA.     Follow Up Recommendations Home health PT    Equipment Recommendations  3in1 (PT)    Recommendations for Other Services OT consult     Precautions / Restrictions Precautions Precautions: Fall Restrictions Weight Bearing Restrictions: No      Mobility  Bed Mobility Overal bed mobility: Modified Independent             General bed mobility comments: pt able to come to EOB without physical assist  Transfers Overall transfer level: Needs assistance Equipment used: None Transfers: Sit to/from Stand;Stand Pivot Transfers Sit to Stand: Min guard Stand pivot transfers: Min guard       General transfer comment: min-guard to pivot from bed to [facility] (pt incontinent with movement).   Ambulation/Gait Ambulation/Gait assistance: Min  guard Gait Distance (Feet): 40 Feet Assistive device: None Gait Pattern/deviations: Step-through pattern;Decreased stride length Gait velocity: decreased Gait velocity interpretation: <1.8 ft/sec, indicate of risk for recurrent falls General Gait Details: pt able to ambulate within room, did not extend distance from close seating due to hypotension, pt asymptomatic with hypotension. Kept on 2L O2, Spo2 remained in 90's.   Stairs            Wheelchair Mobility    Modified Rankin (Stroke Patients Only)       Balance Overall balance assessment: Mild deficits observed, not formally tested                                           Pertinent Vitals/Pain Pain Assessment: Faces Faces Pain Scale: Hurts little more Pain Location: B shins with knee extension and to touch Pain Descriptors / Indicators: Guarding;Grimacing;Sore Pain Intervention(s): Limited activity within patient's tolerance;Monitored during session    [place] expects to be discharged to:: Private residence Living Arrangements: Alone Available Help at Discharge: Personal care attendant Type of Home: House Home Access: Stairs to enter   [employer] of Steps: 2   Home Equipment: Cane - single point      Prior Function Level of Independence: Needs assistance   Gait / Transfers Assistance Needed: independent with ambulation  ADL's / Homemaking Assistance Needed: independent with bathing and dressing. Has an aide that comes 2x/wk to help manage  her house. The aide has offered to come more and pt relays that she could have 24/7 assist if needed.   Comments: Both times that pt has fallen have been in middle of night getting up to bathroom. First time she tripped on O2 tubing. This time she doesn't know why.      Hand Dominance   Dominant Hand: Right    Extremity/Trunk Assessment   Upper Extremity Assessment Upper Extremity Assessment: Defer to OT evaluation     Lower Extremity Assessment Lower Extremity Assessment: Generalized weakness    Cervical / Trunk Assessment Cervical / Trunk Assessment: Kyphotic  Communication   Communication: No difficulties  Cognition Arousal/Alertness: Awake/alert Behavior During Therapy: WFL for tasks assessed/performed Overall Cognitive Status: Within Functional Limits for tasks assessed                                        General Comments General comments (skin integrity, edema, etc.): pt's husband and children are deceased but she has good support from numerous nieces and nephews    Exercises General Exercises - Lower Extremity Ankle Circles/Pumps: AROM;Both;Seated;Supine;15 reps Long Arc Quad: AROM;Both;10 reps;Seated Heel Slides: AROM;Both;5 reps;Supine   Assessment/Plan    PT Assessment Patient needs continued PT services  PT Problem List Decreased activity tolerance;Decreased balance;Decreased mobility;Cardiopulmonary status limiting activity;Pain       PT Treatment Interventions DME instruction;Gait training;Stair training;Functional mobility training;Therapeutic activities;Balance training;Therapeutic exercise;Patient/family education    PT Goals (Current goals can be found in the Care Plan section)  Acute Rehab PT Goals Patient Stated Goal: return home PT Goal Formulation: With patient Time For Goal Achievement: 02/08/20 Potential to Achieve Goals: Good    Frequency Min 3X/week   Barriers to discharge        Co-evaluation               AM-PAC PT "6 Clicks" Mobility  Outcome Measure Help needed turning from your back to your side while in a flat bed without using bedrails?: None Help needed moving from lying on your back to sitting on the side of a flat bed without using bedrails?: None Help needed moving to and from a bed to a chair (including a wheelchair)?: A Little Help needed standing up from a chair using your arms (e.g., wheelchair or bedside  chair)?: A Little Help needed to walk in hospital room?: A Little Help needed climbing 3-5 steps with a railing? : A Little 6 Click Score: 20    End of Session Equipment Utilized During Treatment: Gait belt;Oxygen Activity Tolerance: Patient tolerated treatment well Patient left: in chair;with call bell/phone within reach;with chair alarm set Nurse Communication: Mobility status PT Visit Diagnosis: Unsteadiness on feet (R26.81);Dizziness and giddiness (R42);Pain Pain - Right/Left:  (both) Pain - part of body: Leg    Time: 1006-1056 PT Time Calculation (min) (ACUTE ONLY): 50 min   Charges:   PT Evaluation $PT Eval Moderate Complexity: 1 Mod PT Treatments $Gait Training: 8-22 mins $Therapeutic Activity: 8-22 mins        Leighton Roach, [place]  Pager [phone] Office [place] 01/25/2020, 2:29 PM

## 2020-01-25 NOTE — Progress Notes (Addendum)
[facility] PROGRESS NOTE    Progress Note  [name] [name]  NOI:[id number] DOB: 14-Apr-1931 DOA: 01/24/2020 PCP: Hoyt Koch, MD     Brief Narrative:   [name] [name] is an 86 y.o. female tree of pulmonary fibrosis, rheumatoid arthritis with interstitial lung disease with chronic respiratory failure with hypoxia on home O2 who presents after a mechanical fall.  The day prior to admission fell in the bathroom hit her head at the edge of the marble counter denies any loss of consciousness.  She also relates she has had about a 30 pound weight loss in the last 2 years with decreased appetite and night sweats, in the ED CT angio of the chest was negative for PE, hemoglobin of 11.8 CT head and spine show no acute abnormalities. Assessment/Plan:   Fall: Of unclear etiology. Cardiac biomarkers have remained negative, CT angio of the chest was negative for PE, twelve-lead EKG shows sinus rhythm normal axis no T wave abnormalities J-point elevation. Resume metoprolol, with no events on telemetry. Orthostatic vitals pending CellCept can cause dizziness and hypotension she is an 86 year old frail female. 2D echo pending PT OT pending Check a B12. Also contributing to her falls could be her Xanax.  Atypical chest pain: Going on for several months intermittently only last a few seconds nonexertional nonradiating nonpleuritic.  Troponins are negative, EKG shows sinus rhythm no T wave abnormalities. CTA of the chest was negative for PE. 2D echo is pending continue aspirin.  Unintentional weight loss: Active the records she has lost about 15 kg in the last 2 years. Iron panel, folate TSH HIV are pending. Continue mirtazapine, the seems to be an ongoing problem. ESR CRP will be elevated due to recent trauma and she also has rheumatoid arthritis.  Pulmonary fibrosis/rheumatoid arthritis interstitial lung disease on home O2 chronic respiratory failure with hypoxia: Continue CellCept and  dapsone.  Hyperlipidemia: Continue statins.  Insomnia: She is on Xanax as needed. We will discontinue it.  Severe protein caloric malnutrition: Consult nutrition.  DVT prophylaxis: lovenox Family Communication:none Status is: Inpatient  Remains inpatient appropriate because:Hemodynamically unstable   Dispo: The patient is from: Home              Anticipated d/c is to: Home              Anticipated d/c date is: 2 days              Patient currently is not medically stable to d/c.        Code Status:     Code Status Orders  (From admission, onward)         Start     Ordered   01/25/20 0454  Full code  Continuous        01/25/20 0454        Code Status History    Date Active Date Inactive Code Status Order ID Comments User Context   06/17/2013 0948 06/19/2013 1517 Full Code [id number]  Burtis Junes, NP Inpatient   12/31/2012 1902 01/02/2013 1526 Full Code [id number]  Elsie Stain, MD Inpatient   12/31/2012 1736 12/31/2012 1902 Full Code [id number]  Juanito Doom, MD Outpatient   Advance Care Planning Activity    Advance Directive Documentation     Most Recent Value  Type of Advance Directive Healthcare Power of [place], Living will  Pre-existing out of facility DNR order (yellow form or pink MOST form) --  "MOST" Form in Place? --  IV Access:    Peripheral IV   Procedures and diagnostic studies:   DG Chest 2 View  Result Date: 01/24/2020 CLINICAL DATA:  Chest pain, syncope. EXAM: CHEST - 2 VIEW COMPARISON:  June 18, 2018. FINDINGS: The heart size and mediastinal contours are within normal limits. No pneumothorax is noted. Stable reticular densities are noted throughout both lungs most consistent with pulmonary fibrosis. No definite acute abnormality is noted. No significant pleural effusion is noted. The visualized skeletal structures are unremarkable. IMPRESSION: Stable bilateral pulmonary fibrosis. No significant changes noted  compared to prior exam. Electronically Signed   By: Marijo Conception M.D.   On: 01/24/2020 16:33   DG Elbow Complete Right  Result Date: 01/24/2020 CLINICAL DATA:  Fall, right elbow pain EXAM: RIGHT ELBOW - COMPLETE 3+ VIEW COMPARISON:  None. FINDINGS: Four view radiograph right elbow demonstrates normal alignment. No fracture or dislocation. No effusion. Soft tissues are unremarkable. IMPRESSION: Negative. Electronically Signed   By: Fidela Salisbury MD   On: 01/24/2020 15:42   CT Head Wo Contrast  Result Date: 01/24/2020 CLINICAL DATA:  Headache, posttraumatic. Neck trauma. Additional history provided: Fall, forehead laceration. EXAM: CT HEAD WITHOUT CONTRAST CT CERVICAL SPINE WITHOUT CONTRAST TECHNIQUE: Multidetector CT imaging of the head and cervical spine was performed following the standard protocol without intravenous contrast. Multiplanar CT image reconstructions of the cervical spine were also generated. COMPARISON:  Head CT 12/28/2019 FINDINGS: CT HEAD FINDINGS Brain: Stable, mild generalized parenchymal atrophy and chronic small vessel ischemic disease. There is no acute intracranial hemorrhage. No demarcated cortical infarct. No extra-axial fluid collection. No evidence of intracranial mass. No midline shift. Vascular: No hyperdense vessel.  Atherosclerotic calcifications. Skull: Normal. Negative for fracture or focal lesion. Sinuses/Orbits: Visualized orbits show no acute finding. Scattered mucosal thickening and fluid within ethmoid air cells. Left sphenoid sinus air-fluid level. No significant mastoid effusion. Other: Frontal scalp laceration. CT CERVICAL SPINE FINDINGS Alignment: No significant spondylolisthesis. Skull base and vertebrae: The basion-dental and atlanto-dental intervals are maintained.No evidence of acute fracture to the cervical spine. Soft tissues and spinal canal: No prevertebral fluid or swelling. No visible canal hematoma. Disc levels: Cervical spondylosis with multilevel  disc space narrowing, posterior disc osteophytes, uncovertebral and facet hypertrophy. No high-grade bony spinal canal stenosis or neural foraminal narrowing. Upper chest: No consolidation within the imaged lung apices. No visible pneumothorax. Biapical pleuroparenchymal scarring. IMPRESSION: CT head: 1. No evidence of acute intracranial abnormality. Midline frontal scalp laceration. 2. Stable mild generalized parenchymal atrophy and chronic small vessel ischemic disease. 3. Ethmoid and left sphenoid sinusitis. CT cervical spine: 1. No evidence of acute fracture to the cervical spine. 2. Cervical spondylosis as described. Electronically Signed   By: Kellie Simmering DO   On: 01/24/2020 15:36   CT Angio Chest PE W/Cm &/Or Wo Cm  Result Date: 01/24/2020 CLINICAL DATA:  Elevated D-dimer, fell this morning, previous tobacco abuse, chest pain, syncope EXAM: CT ANGIOGRAPHY CHEST WITH CONTRAST TECHNIQUE: Multidetector CT imaging of the chest was performed using the standard protocol during bolus administration of intravenous contrast. Multiplanar CT image reconstructions and MIPs were obtained to evaluate the vascular anatomy. CONTRAST:  146m OMNIPAQUE IOHEXOL 350 MG/ML SOLN COMPARISON:  01/24/2020, 05/30/2019 FINDINGS: Cardiovascular: This is a technically adequate evaluation of the pulmonary vasculature. No filling defects or pulmonary emboli. The heart is unremarkable without pericardial effusion. Stable ectasia of the thoracic aorta, with mild dilatation of the ascending aorta measuring 3.3 cm. No dissection. Mediastinum/Nodes: No enlarged mediastinal, hilar, or  axillary lymph nodes. Thyroid gland, trachea, and esophagus demonstrate no significant findings. Lungs/Pleura: Extensive changes of emphysema and pulmonary fibrosis are again noted. No acute airspace disease, effusion, or pneumothorax. The central airways are patent. Upper Abdomen: No acute abnormality. Musculoskeletal: No acute or destructive bony lesions.  Reconstructed images demonstrate no additional findings. Review of the MIP images confirms the above findings. IMPRESSION: 1. No evidence of pulmonary embolus. 2. Stable changes of emphysema and pulmonary fibrosis. No acute airspace disease. Electronically Signed   By: Randa Ngo M.D.   On: 01/24/2020 19:05   CT Cervical Spine Wo Contrast  Result Date: 01/24/2020 CLINICAL DATA:  Headache, posttraumatic. Neck trauma. Additional history provided: Fall, forehead laceration. EXAM: CT HEAD WITHOUT CONTRAST CT CERVICAL SPINE WITHOUT CONTRAST TECHNIQUE: Multidetector CT imaging of the head and cervical spine was performed following the standard protocol without intravenous contrast. Multiplanar CT image reconstructions of the cervical spine were also generated. COMPARISON:  Head CT 12/28/2019 FINDINGS: CT HEAD FINDINGS Brain: Stable, mild generalized parenchymal atrophy and chronic small vessel ischemic disease. There is no acute intracranial hemorrhage. No demarcated cortical infarct. No extra-axial fluid collection. No evidence of intracranial mass. No midline shift. Vascular: No hyperdense vessel.  Atherosclerotic calcifications. Skull: Normal. Negative for fracture or focal lesion. Sinuses/Orbits: Visualized orbits show no acute finding. Scattered mucosal thickening and fluid within ethmoid air cells. Left sphenoid sinus air-fluid level. No significant mastoid effusion. Other: Frontal scalp laceration. CT CERVICAL SPINE FINDINGS Alignment: No significant spondylolisthesis. Skull base and vertebrae: The basion-dental and atlanto-dental intervals are maintained.No evidence of acute fracture to the cervical spine. Soft tissues and spinal canal: No prevertebral fluid or swelling. No visible canal hematoma. Disc levels: Cervical spondylosis with multilevel disc space narrowing, posterior disc osteophytes, uncovertebral and facet hypertrophy. No high-grade bony spinal canal stenosis or neural foraminal narrowing.  Upper chest: No consolidation within the imaged lung apices. No visible pneumothorax. Biapical pleuroparenchymal scarring. IMPRESSION: CT head: 1. No evidence of acute intracranial abnormality. Midline frontal scalp laceration. 2. Stable mild generalized parenchymal atrophy and chronic small vessel ischemic disease. 3. Ethmoid and left sphenoid sinusitis. CT cervical spine: 1. No evidence of acute fracture to the cervical spine. 2. Cervical spondylosis as described. Electronically Signed   By: Kellie Simmering DO   On: 01/24/2020 15:36     Medical Consultants:    None.  Anti-Infectives:   none  Subjective:    [name] [name] she denies any chest pain shortness of breath she relates she feels great this morning.  Objective:    Vitals:   01/25/20 0100 01/25/20 0200 01/25/20 0358 01/25/20 0400  BP: 103/66 110/68 95/65   Pulse: 68 70 62   Resp: (!) 22 (!) 24 16   Temp:  98.2 F (36.8 C) 98.1 F (36.7 C)   TempSrc:  Oral Oral   SpO2: 98% 97% 97%   Weight:    46.7 kg  Height:    5' 4.5" (1.638 m)   SpO2: 97 % O2 Flow Rate (L/min): 2 L/min  No intake or output data in the 24 hours ending 01/25/20 0745 Filed Weights   01/24/20 1307 01/25/20 0400  Weight: 47.6 kg 46.7 kg    Exam: General exam: In no acute distress, cachectic Respiratory system: Good air movement and clear to auscultation. Cardiovascular system: S1 & S2 heard, RRR. No JVD. Gastrointestinal system: Abdomen is nondistended, soft and nontender.  Extremities: No pedal edema. Skin: No rashes, lesions or ulcers Psychiatry: Judgement and insight appear normal.  Mood & affect appropriate.    Data Reviewed:    Labs: Basic Metabolic Panel: Recent Labs  Lab 01/24/20 1632  NA 138  K 3.6  CL 101  CO2 27  GLUCOSE 88  BUN 10  CREATININE 0.57  CALCIUM 9.0   GFR Estimated Creatinine Clearance: 36.5 mL/min (by C-G formula based on SCr of 0.57 mg/dL). Liver Function Tests: Recent Labs  Lab 01/24/20 1632  AST  23  ALT 17  ALKPHOS 73  BILITOT 1.0  PROT 7.3  ALBUMIN 3.6   No results for input(s): LIPASE, AMYLASE in the last 168 hours. No results for input(s): AMMONIA in the last 168 hours. Coagulation profile No results for input(s): INR, PROTIME in the last 168 hours. COVID-19 Labs  Recent Labs    01/24/20 1632  DDIMER 2.11*    Lab Results  Component Value Date   SARSCOV2NAA NEGATIVE 01/24/2020   [place] NEGATIVE 07/23/2019    CBC: Recent Labs  Lab 01/24/20 1632  WBC 9.5  NEUTROABS 3.9  HGB 11.8*  HCT 38.1  MCV 100.5*  PLT 209   Cardiac Enzymes: No results for input(s): CKTOTAL, CKMB, CKMBINDEX, TROPONINI in the last 168 hours. BNP (last 3 results) No results for input(s): PROBNP in the last 8760 hours. CBG: No results for input(s): GLUCAP in the last 168 hours. D-Dimer: Recent Labs    01/24/20 1632  DDIMER 2.11*   Hgb A1c: No results for input(s): HGBA1C in the last 72 hours. Lipid Profile: No results for input(s): CHOL, HDL, LDLCALC, TRIG, CHOLHDL, LDLDIRECT in the last 72 hours. Thyroid function studies: No results for input(s): TSH, T4TOTAL, T3FREE, THYROIDAB in the last 72 hours.  Invalid input(s): FREET3 Anemia work up: No results for input(s): VITAMINB12, FOLATE, FERRITIN, TIBC, IRON, RETICCTPCT in the last 72 hours. Sepsis Labs: Recent Labs  Lab 01/24/20 1632  WBC 9.5   Microbiology Recent Results (from the past 240 hour(s))  SARS Coronavirus 2 by RT PCR (hospital order, performed in [facility] hospital lab) Nasopharyngeal Nasopharyngeal Swab     Status: None   Collection Time: 01/24/20  8:20 PM   Specimen: Nasopharyngeal Swab  Result Value Ref Range Status   SARS Coronavirus 2 NEGATIVE NEGATIVE Final    Comment: (NOTE) SARS-CoV-2 target nucleic acids are NOT DETECTED.  The SARS-CoV-2 RNA is generally detectable in upper and lower respiratory specimens during the acute phase of infection. The lowest concentration of SARS-CoV-2 viral  copies this assay can detect is 250 copies / mL. A negative result does not preclude SARS-CoV-2 infection and should not be used as the sole basis for treatment or other patient management decisions.  A negative result may occur with improper specimen collection / handling, submission of specimen other than nasopharyngeal swab, presence of viral mutation(s) within the areas targeted by this assay, and inadequate number of viral copies (<250 copies / mL). A negative result must be combined with clinical observations, patient history, and epidemiological information.  Fact Sheet for Patients:   StrictlyIdeas.no  Fact Sheet for Healthcare Providers: BankingDealers.co.za  This test is not yet approved or  cleared by the [place] FDA and has been authorized for detection and/or diagnosis of SARS-CoV-2 by FDA under an Emergency Use Authorization (EUA).  This EUA will remain in effect (meaning this test can be used) for the duration of the COVID-19 declaration under Section 564(b)(1) of the Act, 21 U.S.C. section 360bbb-3(b)(1), unless the authorization is terminated or revoked sooner.  Performed at [facility],  [address] [place], [place] [address]      Medications:   . aspirin EC  81 mg Oral Daily  . atorvastatin  40 mg Oral Daily  . dapsone  100 mg Oral Daily  . enoxaparin (LOVENOX) injection  40 mg Subcutaneous Q24H  . latanoprost  1 drop Both Eyes QHS  . mouth rinse  15 mL Mouth Rinse q12n4p  . mirtazapine  15 mg Oral QHS  . mycophenolate  1,000 mg Oral BID  . Nintedanib  1 capsule Oral BID  . predniSONE  5 mg Oral Q breakfast   Continuous Infusions:    LOS: 0 days   Charlynne Cousins  [facility]  01/25/2020, 7:45 AM

## 2020-01-26 ENCOUNTER — Inpatient Hospital Stay (HOSPITAL_COMMUNITY): Payer: Medicare Other

## 2020-01-26 DIAGNOSIS — R079 Chest pain, unspecified: Secondary | ICD-10-CM

## 2020-01-26 LAB — BASIC METABOLIC PANEL
Anion gap: 9 (ref 5–15)
BUN: 13 mg/dL (ref 8–23)
CO2: 29 mmol/L (ref 22–32)
Calcium: 9.1 mg/dL (ref 8.9–10.3)
Chloride: 103 mmol/L (ref 98–111)
Creatinine, Ser: 0.71 mg/dL (ref 0.44–1.00)
GFR calc Af Amer: >60 mL/min (ref >60)
GFR calc non Af Amer: >60 mL/min (ref >60)
Glucose, Bld: 92 mg/dL (ref 70–99)
Potassium: 3.7 mmol/L (ref 3.5–5.1)
Sodium: 141 mmol/L (ref 135–145)

## 2020-01-26 LAB — CBC
HCT: 34.1 % — ABNORMAL LOW (ref 36.0–46.0)
Hemoglobin: 10.3 g/dL — ABNORMAL LOW (ref 12.0–15.0)
MCH: 30.5 pg (ref 26.0–34.0)
MCHC: 30.2 g/dL (ref 30.0–36.0)
MCV: 100.9 fL — ABNORMAL HIGH (ref 80.0–100.0)
Platelets: 198 10*3/uL (ref 150–400)
RBC: 3.38 MIL/uL — ABNORMAL LOW (ref 3.87–5.11)
RDW: 13.9 % (ref 11.5–15.5)
WBC: 8.9 10*3/uL (ref 4.0–10.5)
nRBC: 0 % (ref 0.0–0.2)

## 2020-01-26 LAB — ECHOCARDIOGRAM COMPLETE
Area-P 1/2: 2.69 cm2
Calc EF: 55.6 %
Height: 64.5 in
S' Lateral: 2.2 cm
Single Plane A2C EF: 57.9 %
Single Plane A4C EF: 52.9 %
Weight: 1656 oz

## 2020-01-26 MED ORDER — ENSURE ENLIVE PO LIQD
237.0000 mL | Freq: Two times a day (BID) | ORAL | Status: DC
  Administered 2020-01-26 – 2020-01-27 (×3): 237 mL via ORAL

## 2020-01-26 MED ORDER — NINTEDANIB ESYLATE 150 MG PO CAPS
1.0000 | ORAL_CAPSULE | Freq: Two times a day (BID) | ORAL | Status: DC
  Filled 2020-01-26: qty 1

## 2020-01-26 MED ORDER — NINTEDANIB ESYLATE 150 MG PO CAPS
1.0000 | ORAL_CAPSULE | Freq: Two times a day (BID) | ORAL | Status: DC
  Administered 2020-01-26 – 2020-01-27 (×2): 150 mg via ORAL
  Filled 2020-01-26 (×2): qty 1

## 2020-01-26 MED ORDER — SODIUM CHLORIDE 0.9 % IV SOLN
1.0000 g | INTRAVENOUS | Status: DC
  Administered 2020-01-26: 1 g via INTRAVENOUS
  Filled 2020-01-26: qty 10

## 2020-01-26 MED ORDER — PERFLUTREN LIPID MICROSPHERE
1.0000 mL | INTRAVENOUS | Status: AC | PRN
  Administered 2020-01-26: 2 mL via INTRAVENOUS
  Filled 2020-01-26: qty 10

## 2020-01-26 NOTE — Progress Notes (Signed)
  Echocardiogram 2D Echocardiogram has been performed.  Michiel Cowboy 01/26/2020, 11:39 AM

## 2020-01-26 NOTE — Progress Notes (Addendum)
   01/26/20 1301  Assess: MEWS Score  Temp 97.9 F (36.6 C)  BP (!) 79/51 (Pt asymptomatic)  ECG Heart Rate 76  Resp 20  Level of Consciousness Alert  SpO2 92 %  O2 Device Room Air  Assess: MEWS Score  MEWS Temp 0  MEWS Systolic 2  MEWS Pulse 0  MEWS RR 0  MEWS LOC 0  MEWS Score 2  MEWS Score Color Yellow  Assess: if the MEWS score is Yellow or Red  Were vital signs taken at a resting state? Yes  Focused Assessment Documented focused assessment  Early Detection of Sepsis Score *See Row Information* Low  MEWS guidelines implemented *See Row Information* Yes  Treat  MEWS Interventions Escalated (See documentation below)  Take Vital Signs  Increase Vital Sign Frequency  Yellow: Q 2hr X 2 then Q 4hr X 2, if remains yellow, continue Q 4hrs  Escalate  MEWS: Escalate Yellow: discuss with charge nurse/RN and consider discussing with provider and RRT  Notify: Charge Nurse/RN  Name of Charge Nurse/RN Notified Jasmina RN  Date Charge Nurse/RN Notified 01/26/20  Time Charge Nurse/RN Notified 1309  Notify: Provider  Provider Name/Title Venetia Constable, MD  Date Provider Notified 01/26/20  Time Provider Notified 1309  Notification Type Page  Notification Reason Other (Comment) (BP is low.)  Response Other (Comment) (awaiting response)  Notify: Rapid Response  Name of Rapid Response RN Notified none  Document  Patient Outcome Other (Comment) (Pt asymptomatic of low BP-stable)  Progress note created (see row info) Yes   Venetia Constable, MD paged, awaiting response.

## 2020-01-26 NOTE — Progress Notes (Signed)
Current vitals are as follows:   01/26/20 0900  Vitals  Temp 98.3 F (36.8 C)  Temp Source Oral  BP (!) 94/57 (Pt asymptomatic, MD aware.)  MAP (mmHg) 69  BP Location Left Arm  BP Method Automatic  Patient Position (if appropriate) Lying  Pulse Rate 78  Resp 16  Level of Consciousness  Level of Consciousness Alert  Oxygen Therapy  SpO2 98 %  O2 Device Nasal Cannula  O2 Flow Rate (L/min) 2 L/min  MEWS Score  MEWS Temp 0  MEWS Systolic 1  MEWS Pulse 0  MEWS RR 0  MEWS LOC 0  MEWS Score 1  MEWS Score Color Green   Per verbal order from [employer], MD, okay to give morning scheduled dose of metoprolol 25 mg. RN carried out orders.

## 2020-01-26 NOTE — Progress Notes (Signed)
[facility] PROGRESS NOTE    Progress Note  [name] [name]  TDH:[id number] DOB: [birth year] DOA: 01/24/2020 PCP: Hoyt Koch, MD     Brief Narrative:   [name] [name] is an 86 y.o. female tree of pulmonary fibrosis, rheumatoid arthritis with interstitial lung disease with chronic respiratory failure with hypoxia on home O2 who presents after a mechanical fall.  The day prior to admission fell in the bathroom hit her head at the edge of the marble counter denies any loss of consciousness.  She also relates she has had about a 30 pound weight loss in the last 2 years with decreased appetite and night sweats, in the ED CT angio of the chest was negative for PE, hemoglobin of 11.8 CT head and spine show no acute abnormalities. Assessment/Plan:   Fall: Of unclear etiology. Cardiac biomarkers have remained negative, CT angio of the chest was negative for PE, twelve-lead EKG shows sinus rhythm normal axis no T wave abnormalities J-point elevation. Resume metoprolol, with no events on telemetry. Orthostatic vitals are negative.   CellCept can cause dizziness and hypotension especially in a frail small 86 year old female.  She is also on Xanax, but she relates she rarely takes it. Her grew more than 100,000 colonies of gram-negative rods, started empirically on IV Rocephin for 3 days. 2D echo is pending. Physical therapy evaluation they recommended home health PT. This 457.  Atypical chest pain: Going on for several months intermittently only last a few seconds nonexertional nonradiating nonpleuritic.  Troponins are negative, EKG shows sinus rhythm no T wave abnormalities. CTA of the chest was negative for PE. 2D echo is pending continue aspirin.  Unintentional weight loss: Active the records she has lost about 15 kg in the last 2 years. Iron panel, folate TSH HIV are pending. Continue mirtazapine, the seems to be an ongoing problem. ESR CRP will be elevated due to recent trauma  and she also has rheumatoid arthritis.  Pulmonary fibrosis/rheumatoid arthritis interstitial lung disease on home O2 chronic respiratory failure with hypoxia: Continue CellCept and dapsone.  Hyperlipidemia: Continue statins.  Insomnia: She is on Xanax as needed. We will discontinue it.  Severe protein caloric malnutrition: Ensure 3 times daily.  DVT prophylaxis: lovenox Family Communication:none Status is: Inpatient  Remains inpatient appropriate because:Hemodynamically unstable   Dispo: The patient is from: Home              Anticipated d/c is to: Home              Anticipated d/c date is: 1 days              Patient currently is not medically stable to d/c.        Code Status:     Code Status Orders  (From admission, onward)         Start     Ordered   01/25/20 0454  Full code  Continuous        01/25/20 0454        Code Status History    Date Active Date Inactive Code Status Order ID Comments User Context   06/17/2013 0948 06/19/2013 1517 Full Code [id number]  Burtis Junes, NP Inpatient   12/31/2012 1902 01/02/2013 1526 Full Code [id number]  Elsie Stain, MD Inpatient   12/31/2012 1736 12/31/2012 1902 Full Code [id number]  Juanito Doom, MD Outpatient   Advance Care Planning Activity    Advance Directive Documentation     Most Recent Value  Type of Advance Directive Healthcare Power of Attorney, Living will  Pre-existing out of facility DNR order (yellow form or pink MOST form) --  "MOST" Form in Place? --        IV Access:    Peripheral IV   Procedures and diagnostic studies:   DG Chest 2 View  Result Date: 01/24/2020 CLINICAL DATA:  Chest pain, syncope. EXAM: CHEST - 2 VIEW COMPARISON:  June 18, 2018. FINDINGS: The heart size and mediastinal contours are within normal limits. No pneumothorax is noted. Stable reticular densities are noted throughout both lungs most consistent with pulmonary fibrosis. No definite acute abnormality is  noted. No significant pleural effusion is noted. The visualized skeletal structures are unremarkable. IMPRESSION: Stable bilateral pulmonary fibrosis. No significant changes noted compared to prior exam. Electronically Signed   By: Marijo Conception M.D.   On: 01/24/2020 16:33   DG Elbow Complete Right  Result Date: 01/24/2020 CLINICAL DATA:  Fall, right elbow pain EXAM: RIGHT ELBOW - COMPLETE 3+ VIEW COMPARISON:  None. FINDINGS: Four view radiograph right elbow demonstrates normal alignment. No fracture or dislocation. No effusion. Soft tissues are unremarkable. IMPRESSION: Negative. Electronically Signed   By: Fidela Salisbury MD   On: 01/24/2020 15:42   CT Head Wo Contrast  Result Date: 01/24/2020 CLINICAL DATA:  Headache, posttraumatic. Neck trauma. Additional history provided: Fall, forehead laceration. EXAM: CT HEAD WITHOUT CONTRAST CT CERVICAL SPINE WITHOUT CONTRAST TECHNIQUE: Multidetector CT imaging of the head and cervical spine was performed following the standard protocol without intravenous contrast. Multiplanar CT image reconstructions of the cervical spine were also generated. COMPARISON:  Head CT 12/28/2019 FINDINGS: CT HEAD FINDINGS Brain: Stable, mild generalized parenchymal atrophy and chronic small vessel ischemic disease. There is no acute intracranial hemorrhage. No demarcated cortical infarct. No extra-axial fluid collection. No evidence of intracranial mass. No midline shift. Vascular: No hyperdense vessel.  Atherosclerotic calcifications. Skull: Normal. Negative for fracture or focal lesion. Sinuses/Orbits: Visualized orbits show no acute finding. Scattered mucosal thickening and fluid within ethmoid air cells. Left sphenoid sinus air-fluid level. No significant mastoid effusion. Other: Frontal scalp laceration. CT CERVICAL SPINE FINDINGS Alignment: No significant spondylolisthesis. Skull base and vertebrae: The basion-dental and atlanto-dental intervals are maintained.No evidence of  acute fracture to the cervical spine. Soft tissues and spinal canal: No prevertebral fluid or swelling. No visible canal hematoma. Disc levels: Cervical spondylosis with multilevel disc space narrowing, posterior disc osteophytes, uncovertebral and facet hypertrophy. No high-grade bony spinal canal stenosis or neural foraminal narrowing. Upper chest: No consolidation within the imaged lung apices. No visible pneumothorax. Biapical pleuroparenchymal scarring. IMPRESSION: CT head: 1. No evidence of acute intracranial abnormality. Midline frontal scalp laceration. 2. Stable mild generalized parenchymal atrophy and chronic small vessel ischemic disease. 3. Ethmoid and left sphenoid sinusitis. CT cervical spine: 1. No evidence of acute fracture to the cervical spine. 2. Cervical spondylosis as described. Electronically Signed   By: Kellie Simmering DO   On: 01/24/2020 15:36   CT Angio Chest PE W/Cm &/Or Wo Cm  Result Date: 01/24/2020 CLINICAL DATA:  Elevated D-dimer, fell this morning, previous tobacco abuse, chest pain, syncope EXAM: CT ANGIOGRAPHY CHEST WITH CONTRAST TECHNIQUE: Multidetector CT imaging of the chest was performed using the standard protocol during bolus administration of intravenous contrast. Multiplanar CT image reconstructions and MIPs were obtained to evaluate the vascular anatomy. CONTRAST:  167m OMNIPAQUE IOHEXOL 350 MG/ML SOLN COMPARISON:  01/24/2020, 05/30/2019 FINDINGS: Cardiovascular: This is a technically adequate evaluation of the pulmonary vasculature.  No filling defects or pulmonary emboli. The heart is unremarkable without pericardial effusion. Stable ectasia of the thoracic aorta, with mild dilatation of the ascending aorta measuring 3.3 cm. No dissection. Mediastinum/Nodes: No enlarged mediastinal, hilar, or axillary lymph nodes. Thyroid gland, trachea, and esophagus demonstrate no significant findings. Lungs/Pleura: Extensive changes of emphysema and pulmonary fibrosis are again noted.  No acute airspace disease, effusion, or pneumothorax. The central airways are patent. Upper Abdomen: No acute abnormality. Musculoskeletal: No acute or destructive bony lesions. Reconstructed images demonstrate no additional findings. Review of the MIP images confirms the above findings. IMPRESSION: 1. No evidence of pulmonary embolus. 2. Stable changes of emphysema and pulmonary fibrosis. No acute airspace disease. Electronically Signed   By: Randa Ngo M.D.   On: 01/24/2020 19:05   CT Cervical Spine Wo Contrast  Result Date: 01/24/2020 CLINICAL DATA:  Headache, posttraumatic. Neck trauma. Additional history provided: Fall, forehead laceration. EXAM: CT HEAD WITHOUT CONTRAST CT CERVICAL SPINE WITHOUT CONTRAST TECHNIQUE: Multidetector CT imaging of the head and cervical spine was performed following the standard protocol without intravenous contrast. Multiplanar CT image reconstructions of the cervical spine were also generated. COMPARISON:  Head CT 12/28/2019 FINDINGS: CT HEAD FINDINGS Brain: Stable, mild generalized parenchymal atrophy and chronic small vessel ischemic disease. There is no acute intracranial hemorrhage. No demarcated cortical infarct. No extra-axial fluid collection. No evidence of intracranial mass. No midline shift. Vascular: No hyperdense vessel.  Atherosclerotic calcifications. Skull: Normal. Negative for fracture or focal lesion. Sinuses/Orbits: Visualized orbits show no acute finding. Scattered mucosal thickening and fluid within ethmoid air cells. Left sphenoid sinus air-fluid level. No significant mastoid effusion. Other: Frontal scalp laceration. CT CERVICAL SPINE FINDINGS Alignment: No significant spondylolisthesis. Skull base and vertebrae: The basion-dental and atlanto-dental intervals are maintained.No evidence of acute fracture to the cervical spine. Soft tissues and spinal canal: No prevertebral fluid or swelling. No visible canal hematoma. Disc levels: Cervical spondylosis  with multilevel disc space narrowing, posterior disc osteophytes, uncovertebral and facet hypertrophy. No high-grade bony spinal canal stenosis or neural foraminal narrowing. Upper chest: No consolidation within the imaged lung apices. No visible pneumothorax. Biapical pleuroparenchymal scarring. IMPRESSION: CT head: 1. No evidence of acute intracranial abnormality. Midline frontal scalp laceration. 2. Stable mild generalized parenchymal atrophy and chronic small vessel ischemic disease. 3. Ethmoid and left sphenoid sinusitis. CT cervical spine: 1. No evidence of acute fracture to the cervical spine. 2. Cervical spondylosis as described. Electronically Signed   By: Kellie Simmering DO   On: 01/24/2020 15:36     Medical Consultants:    None.  Anti-Infectives:   none  Subjective:    [name] [name] feels great this morning she is hungry would like to eat..  Objective:    Vitals:   01/26/20 0026 01/26/20 0352 01/26/20 0706 01/26/20 0900  BP: (!) 100/59 (!) 89/49 (!) 93/58 (!) 94/57  Pulse: 80 70 69 78  Resp: _0 Temp: 98.3 F (36.8 C) 98.2 F (36.8 C)  98.3 F (36.8 C)  TempSrc: Oral Oral  Oral  SpO2: 96% 97%  98%  Weight:  46.9 kg    Height:       SpO2: 98 % O2 Flow Rate (L/min): 2 L/min   Intake/Output Summary (Last 24 hours) at 01/26/2020 0918 Last data filed at 01/26/2020 0358 Gross per 24 hour  Intake 240 ml  Output 500 ml  Net -260 ml   Filed Weights   01/24/20 1307 01/25/20 0400 01/26/20 0352  Weight:  47.6 kg 46.7 kg 46.9 kg    Exam: General exam: In no acute distress, cachectic Respiratory system: Good air movement and clear to auscultation. Cardiovascular system: S1 & S2 heard, RRR. No JVD. Gastrointestinal system: Abdomen is nondistended, soft and nontender.  Extremities: No pedal edema. Skin: No rashes, lesions or ulcers Psychiatry: Judgement and insight appear normal. Mood & affect appropriate.   Data Reviewed:    Labs: Basic Metabolic  Panel: Recent Labs  Lab 01/24/20 1632 01/25/20 0942 01/26/20 0627  NA 138  --  141  K 3.6  --  3.7  CL 101  --  103  CO2 27  --  29  GLUCOSE 88  --  92  BUN 10  --  13  CREATININE 0.57  --  0.71  CALCIUM 9.0  --  9.1  MG  --  1.5*  --    GFR Estimated Creatinine Clearance: 36.7 mL/min (by C-G formula based on SCr of 0.71 mg/dL). Liver Function Tests: Recent Labs  Lab 01/24/20 1632  AST 23  ALT 17  ALKPHOS 73  BILITOT 1.0  PROT 7.3  ALBUMIN 3.6   No results for input(s): LIPASE, AMYLASE in the last 168 hours. No results for input(s): AMMONIA in the last 168 hours. Coagulation profile No results for input(s): INR, PROTIME in the last 168 hours. COVID-19 Labs  Recent Labs    01/24/20 1632 01/25/20 0942  DDIMER 2.11*  --   FERRITIN  --  524*    Lab Results  Component Value Date   SARSCOV2NAA NEGATIVE 01/24/2020   Reading NEGATIVE 07/23/2019    CBC: Recent Labs  Lab 01/24/20 1632 01/26/20 0627  WBC 9.5 8.9  NEUTROABS 3.9  --   HGB 11.8* 10.3*  HCT 38.1 34.1*  MCV 100.5* 100.9*  PLT 209 198   Cardiac Enzymes: No results for input(s): CKTOTAL, CKMB, CKMBINDEX, TROPONINI in the last 168 hours. BNP (last 3 results) No results for input(s): PROBNP in the last 8760 hours. CBG: No results for input(s): GLUCAP in the last 168 hours. D-Dimer: Recent Labs    01/24/20 1632  DDIMER 2.11*   Hgb A1c: Recent Labs    01/25/20 0942  HGBA1C 4.2*   Lipid Profile: No results for input(s): CHOL, HDL, LDLCALC, TRIG, CHOLHDL, LDLDIRECT in the last 72 hours. Thyroid function studies: Recent Labs    01/25/20 0942  TSH 5.379*   Anemia work up: Recent Labs    01/25/20 0942  VITAMINB12 457  FOLATE 14.0  FERRITIN 524*  TIBC 267  IRON 52   Sepsis Labs: Recent Labs  Lab 01/24/20 1632 01/26/20 0627  WBC 9.5 8.9   Microbiology Recent Results (from the past 240 hour(s))  SARS Coronavirus 2 by RT PCR (hospital order, performed in [facility]  hospital lab) Nasopharyngeal Nasopharyngeal Swab     Status: None   Collection Time: 01/24/20  8:20 PM   Specimen: Nasopharyngeal Swab  Result Value Ref Range Status   SARS Coronavirus 2 NEGATIVE NEGATIVE Final    Comment: (NOTE) SARS-CoV-2 target nucleic acids are NOT DETECTED.  The SARS-CoV-2 RNA is generally detectable in upper and lower respiratory specimens during the acute phase of infection. The lowest concentration of SARS-CoV-2 viral copies this assay can detect is 250 copies / mL. A negative result does not preclude SARS-CoV-2 infection and should not be used as the sole basis for treatment or other patient management decisions.  A negative result may occur with improper specimen collection / handling, submission  of specimen other than nasopharyngeal swab, presence of viral mutation(s) within the areas targeted by this assay, and inadequate number of viral copies (<250 copies / mL). A negative result must be combined with clinical observations, patient history, and epidemiological information.  Fact Sheet for Patients:   StrictlyIdeas.no  Fact Sheet for Healthcare Providers: BankingDealers.co.za  This test is not yet approved or  cleared by the [place] FDA and has been authorized for detection and/or diagnosis of SARS-CoV-2 by FDA under an Emergency Use Authorization (EUA).  This EUA will remain in effect (meaning this test can be used) for the duration of the COVID-19 declaration under Section 564(b)(1) of the Act, 21 U.S.C. section 360bbb-3(b)(1), unless the authorization is terminated or revoked sooner.  Performed at [facility], [address] [place], [place] [address]   Culture, Urine     Status: Abnormal (Preliminary result)   Collection Time: 01/25/20  5:04 AM   Specimen: Urine, Random  Result Value Ref Range Status   Specimen Description URINE, RANDOM  Final   Special Requests   Final     NONE Performed at [facility], [place] [address], [place], [place] [address]    Culture >=100,000 COLONIES/mL GRAM NEGATIVE RODS (A)  Final   Report Status PENDING  Incomplete     Medications:   . aspirin EC  81 mg Oral Daily  . atorvastatin  40 mg Oral Daily  . dapsone  100 mg Oral Daily  . enoxaparin (LOVENOX) injection  30 mg Subcutaneous Q24H  . feeding supplement (ENSURE ENLIVE)  237 mL Oral BID BM  . latanoprost  1 drop Both Eyes QHS  . mouth rinse  15 mL Mouth Rinse q12n4p  . metoprolol succinate  25 mg Oral Daily  . mirtazapine  15 mg Oral QHS  . multivitamin with minerals  1 tablet Oral Daily  . mycophenolate  1,000 mg Oral Daily  . mycophenolate  500 mg Oral QPM  . Nintedanib  1 capsule Oral BID  . predniSONE  5 mg Oral Q breakfast   Continuous Infusions:    LOS: 1 day   Charlynne Cousins  [facility]  01/26/2020, 9:18 AM

## 2020-01-27 DIAGNOSIS — Z86718 Personal history of other venous thrombosis and embolism: Secondary | ICD-10-CM

## 2020-01-27 LAB — GLUCOSE, CAPILLARY: Glucose-Capillary: 97 mg/dL (ref 70–99)

## 2020-01-27 LAB — URINE CULTURE: Culture: >=100000 — AB

## 2020-01-27 MED ORDER — MYCOPHENOLATE MOFETIL 500 MG PO TABS: ORAL_TABLET | ORAL | 3 refills | Status: DC

## 2020-01-27 NOTE — TOC Transition Note (Addendum)
Transition of Care [facility]) - CM/SW Discharge Note   Patient Details  Name: [name] [name] MRN: [id number] Date of Birth: Mar 13, 1931  Transition of Care [facility]) CM/SW Contact:  Zenon Mayo, RN Phone Number: 01/27/2020, 10:06 AM   Clinical Narrative:    Patient is for dc today, NCM offered choice for HHPT, [place], she chose [facility], referral given to [facility] , she states she is able to take referral . Soc will begin in 24 to 48 hrs post dc, she has no issues in getting her medications. She states her nephew will be transporting her home today.  Patient states she has a friend who comes by on Mon and Thurs from 1 to 2 to help her with cleaning, and then she has 4 people that are on a team that come in every other Sat to do the heavy cleaning.  She has home oxygen 2 liters , which the portable is in her room with her.     Final next level of care: [place] Barriers to Discharge: No Barriers Identified   Patient Goals and CMS Choice Patient states their goals for this hospitalization and ongoing recovery are:: to get better CMS Medicare.gov Compare Post Acute Care list provided to:: Patient Choice offered to / list presented to : Patient  Discharge Placement                       Discharge Plan and Services                  DME Agency: NA       HH Arranged: PT, OT HH Agency: [place] Date [place]: 01/27/20 Time [place]: 1006 Representative spoke with at [place]: [place] ([place]) Interventions     Readmission Risk Interventions No flowsheet data found.

## 2020-01-27 NOTE — Progress Notes (Signed)
Reviewed and gave D/C instructions. D/C'd tele and IV. Tolerated well.

## 2020-01-27 NOTE — Discharge Summary (Signed)
Physician Discharge Summary  [name] [name] OVF:[id number] DOB: 1930/10/30 DOA: 01/24/2020  PCP: Hoyt Koch, MD  Admit date: 01/24/2020 Discharge date: 01/27/2020  Admitted From: Home Disposition:  Home  Recommendations for Outpatient Follow-up:  1. Follow up with PCP in 1-2 weeks 2. Please obtain BMP/CBC in one week   Home Health:Yes Equipment/Devices: None  Discharge Condition:Stable CODE STATUS:Full Diet recommendation: Heart Healthy  Brief/Interim Summary: 86 y.o. female tree of pulmonary fibrosis, rheumatoid arthritis with interstitial lung disease with chronic respiratory failure with hypoxia on home O2 who presents after a mechanical fall.  The day prior to admission fell in the bathroom hit her head at the edge of the marble counter denies any loss of consciousness.  She also relates she has had about a 30 pound weight loss in the last 2 years with decreased appetite and night sweats, in the ED CT angio of the chest was negative for PE, hemoglobin of 11.8 CT head and spine show no acute abnormalities  Discharge Diagnoses:  Principal Problem:   Fall Active Problems:   Dyslipidemia   Personal history of DVT (deep vein thrombosis)   Postinflammatory pulmonary fibrosis / RA ILD    Chest pain   CAD (coronary artery disease)   RA (rheumatoid arthritis) (HCC)   Chronic respiratory failure with hypoxia (HCC)   Unintentional weight loss  Fall: Possibly due to both of an antifibrotic medication for her pulmonary fibrosis. Cardiac biomarkers remain negative, CT angio of the chest was negative for PE twelve-lead EKG showed normal sinus rhythm no signs of ischemia. She was continued on her metoprolol. Orthostatics were negative. The patient related that once she was started on her pulmonary fibrosis medication she started getting dizzy, at the beginning she related it was the CellCept but after speaking with her primary pulmonologist he was more concerned about Ofev  causing falls, after speaking the patient she did agreed it was after the LVAD. UA showed more than 100,000 colonies she was treated empirically for 3 days of antibiotics. Her 2D echo showed her EF and normal right ventricle. Physical therapy evaluated the patient recommended home health PT. B12 and folate were checked which were unremarkable. Also discontinue her Xanax due to her age and her high risk of falls.  Atypical chest pain: Going on for several months intermittent nonexertional. CTA negative for PE EKG showed no signs of ACS, cardiac biomarkers remain negative. 2D echo showed no aortic stenosis continue aspirin.  Likely muscle skeletal as it was reproducible by palpation.  Patient weight loss: 15 kg over the last 2 years, folate, TSH and HIV were unremarkable. Continue Remeron. Her ESR was elevated will need to follow-up with primary care as an outpatient patient.  Pulmonary fibrosis/rheumatoid arthritis interstitial lung disease on chronic home O2 for hypoxia: Continue CellCept dapsone and prednisone. Her Ofev was held.  Hyperlipidemia: Continue statins.  Insomnia: Xanax was discontinued.      Discharge Instructions  Discharge Instructions    Diet - low sodium heart healthy   Complete by: As directed    Increase activity slowly   Complete by: As directed    No wound care   Complete by: As directed      Allergies as of 01/27/2020      Reactions   Crestor [rosuvastatin] Other (See Comments)   Muscle pain   Lactose Intolerance (gi) Other (See Comments)   Stomach upset   Penicillins Hives   Imuran [azathioprine] Nausea And Vomiting   Niacin Hives  Medication List    STOP taking these medications   ALPRAZolam 0.25 MG tablet Commonly known as: XANAX   hydrocortisone 2.5 % rectal cream Commonly known as: Anusol-HC   mycophenolate 500 MG tablet Commonly known as: CellCept   nitrofurantoin (macrocrystal-monohydrate) 100 MG capsule Commonly known  as: Macrobid   silver sulfADIAZINE 1 % cream Commonly known as: Silvadene     TAKE these medications   aspirin EC 81 MG tablet Take 1 tablet (81 mg total) by mouth daily.   atorvastatin 40 MG tablet Commonly known as: LIPITOR Take 1 tablet (40 mg total) by mouth daily.   dapsone 100 MG tablet TAKE 1 TABLET(100 MG) BY MOUTH DAILY What changed:   how much to take  how to take this  when to take this  additional instructions   fluticasone 50 MCG/ACT nasal spray Commonly known as: FLONASE Place 2 sprays into both nostrils daily as needed for allergies or rhinitis.   furosemide 20 MG tablet Commonly known as: LASIX Take 1 tablet by mouth daily as needed for swelling. What changed:   how much to take  how to take this  when to take this  reasons to take this  additional instructions   levalbuterol 45 MCG/ACT inhaler Commonly known as: Xopenex HFA Inhale 2 puffs into the lungs every 6 (six) hours as needed for wheezing.   metoprolol succinate 25 MG 24 hr tablet Commonly known as: Toprol XL Take 1 tablet (25 mg total) by mouth daily.   mirtazapine 15 MG tablet Commonly known as: Remeron Take 1 tablet (15 mg total) by mouth at bedtime.   nitroGLYCERIN 0.4 MG SL tablet Commonly known as: NITROSTAT Place 1 tablet (0.4 mg total) under the tongue every 5 (five) minutes as needed for chest pain.   Ofev 150 MG Caps Generic drug: Nintedanib TAKE 1 CAPSULE TWICE A DAY What changed:   how much to take  when to take this   polyethylene glycol powder 17 GM/SCOOP powder Commonly known as: GLYCOLAX/MIRALAX Take 17 g by mouth 2 (two) times daily as needed. What changed: reasons to take this   predniSONE 5 MG tablet Commonly known as: DELTASONE Take 1 tablet (5 mg total) by mouth daily with breakfast.   REMICADE IV Inject 5 mg into the vein every 14 (fourteen) days.   Systane 0.4-0.3 % Soln Generic drug: Polyethyl Glycol-Propyl Glycol Apply 1 drop to eye  daily as needed (for dry eyes).   triamcinolone cream 0.1 % Commonly known as: KENALOG Apply 1 application topically 2 (two) times daily.   valACYclovir 1000 MG tablet Commonly known as: VALTREX TAKE 1 TABLET TWICE A DAY       Follow-up Information    Hoyt Koch, MD. Schedule an appointment as soon as possible for a visit on 01/27/2020.   Specialty: Internal Medicine Contact information: [address] [place] [address] [phone]        Go to  [place].   Specialty: Emergency Medicine Why: As needed, If symptoms worsen Contact information: [id number] [place] [address] [phone]             Allergies  Allergen Reactions  . Crestor [Rosuvastatin] Other (See Comments)    Muscle pain  . Lactose Intolerance (Gi) Other (See Comments)    Stomach upset  . Penicillins Hives  . Imuran [Azathioprine] Nausea And Vomiting  . Niacin Hives    Consultations:  None  Procedures/Studies: DG Chest 2 View  Result Date: 01/24/2020 CLINICAL DATA:  Chest pain, syncope. EXAM: CHEST - 2 VIEW COMPARISON:  June 18, 2018. FINDINGS: The heart size and mediastinal contours are within normal limits. No pneumothorax is noted. Stable reticular densities are noted throughout both lungs most consistent with pulmonary fibrosis. No definite acute abnormality is noted. No significant pleural effusion is noted. The visualized skeletal structures are unremarkable. IMPRESSION: Stable bilateral pulmonary fibrosis. No significant changes noted compared to prior exam. Electronically Signed   By: Marijo Conception M.D.   On: 01/24/2020 16:33   DG Lumbar Spine Complete  Result Date: 12/28/2019 CLINICAL DATA:  Low back pain secondary to a fall. EXAM: LUMBAR SPINE - COMPLETE 4+ VIEW COMPARISON:  CT scan of the abdomen and pelvis dated 09/28/2015 FINDINGS: There is no fracture bone destruction.  Lateral alignment is normal. Slight disc space narrowing at L4-5 and L5-S1, chronic. Slight left facet arthritis at L4-5 and L5-S1. Pulmonary fibrosis visible at the lung bases. Aortic atherosclerosis. IMPRESSION: 1. No acute abnormality of the lumbar spine. 2. Chronic degenerative disc and joint disease in the lower lumbar spine. 3.  Aortic Atherosclerosis (ICD10-I70.0). Electronically Signed   By: Lorriane Shire M.D.   On: 12/28/2019 13:49   DG Elbow Complete Right  Result Date: 01/24/2020 CLINICAL DATA:  Fall, right elbow pain EXAM: RIGHT ELBOW - COMPLETE 3+ VIEW COMPARISON:  None. FINDINGS: Four view radiograph right elbow demonstrates normal alignment. No fracture or dislocation. No effusion. Soft tissues are unremarkable. IMPRESSION: Negative. Electronically Signed   By: Fidela Salisbury MD   On: 01/24/2020 15:42   CT Head Wo Contrast  Result Date: 01/24/2020 CLINICAL DATA:  Headache, posttraumatic. Neck trauma. Additional history provided: Fall, forehead laceration. EXAM: CT HEAD WITHOUT CONTRAST CT CERVICAL SPINE WITHOUT CONTRAST TECHNIQUE: Multidetector CT imaging of the head and cervical spine was performed following the standard protocol without intravenous contrast. Multiplanar CT image reconstructions of the cervical spine were also generated. COMPARISON:  Head CT 12/28/2019 FINDINGS: CT HEAD FINDINGS Brain: Stable, mild generalized parenchymal atrophy and chronic small vessel ischemic disease. There is no acute intracranial hemorrhage. No demarcated cortical infarct. No extra-axial fluid collection. No evidence of intracranial mass. No midline shift. Vascular: No hyperdense vessel.  Atherosclerotic calcifications. Skull: Normal. Negative for fracture or focal lesion. Sinuses/Orbits: Visualized orbits show no acute finding. Scattered mucosal thickening and fluid within ethmoid air cells. Left sphenoid sinus air-fluid level. No significant mastoid effusion. Other: Frontal scalp laceration. CT  CERVICAL SPINE FINDINGS Alignment: No significant spondylolisthesis. Skull base and vertebrae: The basion-dental and atlanto-dental intervals are maintained.No evidence of acute fracture to the cervical spine. Soft tissues and spinal canal: No prevertebral fluid or swelling. No visible canal hematoma. Disc levels: Cervical spondylosis with multilevel disc space narrowing, posterior disc osteophytes, uncovertebral and facet hypertrophy. No high-grade bony spinal canal stenosis or neural foraminal narrowing. Upper chest: No consolidation within the imaged lung apices. No visible pneumothorax. Biapical pleuroparenchymal scarring. IMPRESSION: CT head: 1. No evidence of acute intracranial abnormality. Midline frontal scalp laceration. 2. Stable mild generalized parenchymal atrophy and chronic small vessel ischemic disease. 3. Ethmoid and left sphenoid sinusitis. CT cervical spine: 1. No evidence of acute fracture to the cervical spine. 2. Cervical spondylosis as described. Electronically Signed   By: Kellie Simmering DO   On: 01/24/2020 15:36   CT Head Wo Contrast  Result Date: 12/28/2019 CLINICAL DATA:  Fall, left-sided neck pain, right forehead abrasion EXAM: CT HEAD WITHOUT CONTRAST CT  CERVICAL SPINE WITHOUT CONTRAST TECHNIQUE: Multidetector CT imaging of the head and cervical spine was performed following the standard protocol without intravenous contrast. Multiplanar CT image reconstructions of the cervical spine were also generated. COMPARISON:  11/12/2013, 02/08/2018 FINDINGS: CT HEAD FINDINGS Brain: No evidence of acute infarction, hemorrhage, hydrocephalus, extra-axial collection or mass lesion/mass effect. Low-density changes within the periventricular and subcortical white matter compatible with chronic microvascular ischemic change. Mild-moderate diffuse cerebral volume loss. Vascular: Atherosclerotic calcifications involving the large vessels of the skull base. No unexpected hyperdense vessel. Skull: Normal.  Negative for fracture or focal lesion. Sinuses/Orbits: Mucosal thickening with air-fluid level in the left sphenoid sinus. Scattered ethmoid air cell mucosal thickening. Orbital structures unremarkable. Other: None. CT CERVICAL SPINE FINDINGS Alignment: Facet joints are aligned without dislocation. Dens and lateral masses are aligned. No static listhesis. Skull base and vertebrae: No acute fracture. No primary bone lesion or focal pathologic process. Soft tissues and spinal canal: No prevertebral fluid or swelling. No visible canal hematoma. Disc levels: Multilevel intervertebral disc height loss, similar to the previous study. Relatively mild facet and uncovertebral arthropathy. No evidence of high-grade canal stenosis by CT. Upper chest: Biapical pleuroparenchymal scarring. Other: Unchanged subcentimeter thyroid lobe nodules. Not clinically significant; no follow-up imaging recommended (ref: J Am Coll Radiol. 2015 Feb;12(2): 143-50). IMPRESSION: 1. No acute intracranial findings. 2. No acute fracture or traumatic listhesis of the cervical spine. 3. Left sphenoid sinus disease. Correlate for acute sinusitis. Electronically Signed   By: Davina Poke D.O.   On: 12/28/2019 13:40   CT Angio Chest PE W/Cm &/Or Wo Cm  Result Date: 01/24/2020 CLINICAL DATA:  Elevated D-dimer, fell this morning, previous tobacco abuse, chest pain, syncope EXAM: CT ANGIOGRAPHY CHEST WITH CONTRAST TECHNIQUE: Multidetector CT imaging of the chest was performed using the standard protocol during bolus administration of intravenous contrast. Multiplanar CT image reconstructions and MIPs were obtained to evaluate the vascular anatomy. CONTRAST:  120m OMNIPAQUE IOHEXOL 350 MG/ML SOLN COMPARISON:  01/24/2020, 05/30/2019 FINDINGS: Cardiovascular: This is a technically adequate evaluation of the pulmonary vasculature. No filling defects or pulmonary emboli. The heart is unremarkable without pericardial effusion. Stable ectasia of the  thoracic aorta, with mild dilatation of the ascending aorta measuring 3.3 cm. No dissection. Mediastinum/Nodes: No enlarged mediastinal, hilar, or axillary lymph nodes. Thyroid gland, trachea, and esophagus demonstrate no significant findings. Lungs/Pleura: Extensive changes of emphysema and pulmonary fibrosis are again noted. No acute airspace disease, effusion, or pneumothorax. The central airways are patent. Upper Abdomen: No acute abnormality. Musculoskeletal: No acute or destructive bony lesions. Reconstructed images demonstrate no additional findings. Review of the MIP images confirms the above findings. IMPRESSION: 1. No evidence of pulmonary embolus. 2. Stable changes of emphysema and pulmonary fibrosis. No acute airspace disease. Electronically Signed   By: MRanda NgoM.D.   On: 01/24/2020 19:05   CT Cervical Spine Wo Contrast  Result Date: 01/24/2020 CLINICAL DATA:  Headache, posttraumatic. Neck trauma. Additional history provided: Fall, forehead laceration. EXAM: CT HEAD WITHOUT CONTRAST CT CERVICAL SPINE WITHOUT CONTRAST TECHNIQUE: Multidetector CT imaging of the head and cervical spine was performed following the standard protocol without intravenous contrast. Multiplanar CT image reconstructions of the cervical spine were also generated. COMPARISON:  Head CT 12/28/2019 FINDINGS: CT HEAD FINDINGS Brain: Stable, mild generalized parenchymal atrophy and chronic small vessel ischemic disease. There is no acute intracranial hemorrhage. No demarcated cortical infarct. No extra-axial fluid collection. No evidence of intracranial mass. No midline shift. Vascular: No hyperdense vessel.  Atherosclerotic calcifications.  Skull: Normal. Negative for fracture or focal lesion. Sinuses/Orbits: Visualized orbits show no acute finding. Scattered mucosal thickening and fluid within ethmoid air cells. Left sphenoid sinus air-fluid level. No significant mastoid effusion. Other: Frontal scalp laceration. CT CERVICAL  SPINE FINDINGS Alignment: No significant spondylolisthesis. Skull base and vertebrae: The basion-dental and atlanto-dental intervals are maintained.No evidence of acute fracture to the cervical spine. Soft tissues and spinal canal: No prevertebral fluid or swelling. No visible canal hematoma. Disc levels: Cervical spondylosis with multilevel disc space narrowing, posterior disc osteophytes, uncovertebral and facet hypertrophy. No high-grade bony spinal canal stenosis or neural foraminal narrowing. Upper chest: No consolidation within the imaged lung apices. No visible pneumothorax. Biapical pleuroparenchymal scarring. IMPRESSION: CT head: 1. No evidence of acute intracranial abnormality. Midline frontal scalp laceration. 2. Stable mild generalized parenchymal atrophy and chronic small vessel ischemic disease. 3. Ethmoid and left sphenoid sinusitis. CT cervical spine: 1. No evidence of acute fracture to the cervical spine. 2. Cervical spondylosis as described. Electronically Signed   By: Kellie Simmering DO   On: 01/24/2020 15:36   CT Cervical Spine Wo Contrast  Result Date: 12/28/2019 CLINICAL DATA:  Fall, left-sided neck pain, right forehead abrasion EXAM: CT HEAD WITHOUT CONTRAST CT CERVICAL SPINE WITHOUT CONTRAST TECHNIQUE: Multidetector CT imaging of the head and cervical spine was performed following the standard protocol without intravenous contrast. Multiplanar CT image reconstructions of the cervical spine were also generated. COMPARISON:  11/12/2013, 02/08/2018 FINDINGS: CT HEAD FINDINGS Brain: No evidence of acute infarction, hemorrhage, hydrocephalus, extra-axial collection or mass lesion/mass effect. Low-density changes within the periventricular and subcortical white matter compatible with chronic microvascular ischemic change. Mild-moderate diffuse cerebral volume loss. Vascular: Atherosclerotic calcifications involving the large vessels of the skull base. No unexpected hyperdense vessel. Skull:  Normal. Negative for fracture or focal lesion. Sinuses/Orbits: Mucosal thickening with air-fluid level in the left sphenoid sinus. Scattered ethmoid air cell mucosal thickening. Orbital structures unremarkable. Other: None. CT CERVICAL SPINE FINDINGS Alignment: Facet joints are aligned without dislocation. Dens and lateral masses are aligned. No static listhesis. Skull base and vertebrae: No acute fracture. No primary bone lesion or focal pathologic process. Soft tissues and spinal canal: No prevertebral fluid or swelling. No visible canal hematoma. Disc levels: Multilevel intervertebral disc height loss, similar to the previous study. Relatively mild facet and uncovertebral arthropathy. No evidence of high-grade canal stenosis by CT. Upper chest: Biapical pleuroparenchymal scarring. Other: Unchanged subcentimeter thyroid lobe nodules. Not clinically significant; no follow-up imaging recommended (ref: J Am Coll Radiol. 2015 Feb;12(2): 143-50). IMPRESSION: 1. No acute intracranial findings. 2. No acute fracture or traumatic listhesis of the cervical spine. 3. Left sphenoid sinus disease. Correlate for acute sinusitis. Electronically Signed   By: Davina Poke D.O.   On: 12/28/2019 13:40   DG Shoulder Left  Result Date: 12/28/2019 CLINICAL DATA:  Pain secondary to a fall today. EXAM: LEFT SHOULDER - 2+ VIEW COMPARISON:  None. FINDINGS: There is no evidence of fracture or dislocation. There is no evidence of arthropathy or other focal bone abnormality. Soft tissues are unremarkable. Pulmonary fibrosis. IMPRESSION: Normal left shoulder. Electronically Signed   By: Lorriane Shire M.D.   On: 12/28/2019 13:50   ECHOCARDIOGRAM COMPLETE  Result Date: 01/26/2020    ECHOCARDIOGRAM REPORT   Patient Name:   [name] [name] Date of Exam: 01/26/2020 Medical Rec #:  [id number]     Height:       64.5 in Accession #:    [id number]    Weight:  103.5 lb Date of Birth:  1930-10-07     BSA:          1.487 m Patient Age:    50  years      BP:           94/57 mmHg Patient Gender: F             HR:           72 bpm. Exam Location:  Inpatient Procedure: 2D Echo, Cardiac Doppler, Color Doppler and Intracardiac            Opacification Agent Indications:    Chest Pain 786.50 / R07.9  History:        Patient has prior history of Echocardiogram examinations, most                 recent 12/27/2018. CHF, CAD, Signs/Symptoms:Chest Pain; Risk                 Factors:Dyslipidemia and Former Smoker. DVT.  Sonographer:    Vickie Epley RDCS Referring Phys: [id number] [place]  1. Left ventricular ejection fraction, by estimation, is 55 to 60%. The left ventricle has normal function. The left ventricle has no regional wall motion abnormalities. Left ventricular diastolic parameters are indeterminate. Elevated left atrial pressure.  2. Right ventricular systolic function is normal. The right ventricular size is normal. There is normal pulmonary artery systolic pressure.  3. The mitral valve is normal in structure. No evidence of mitral valve regurgitation.  4. The aortic valve is tricuspid. Aortic valve regurgitation is not visualized. Mild aortic valve sclerosis is present, with no evidence of aortic valve stenosis.  5. The inferior vena cava is normal in size with <50% respiratory variability, suggesting right atrial pressure of 8 mmHg. FINDINGS  Left Ventricle: Left ventricular ejection fraction, by estimation, is 55 to 60%. The left ventricle has normal function. The left ventricle has no regional wall motion abnormalities. Definity contrast agent was given IV to delineate the left ventricular  endocardial borders. The left ventricular internal cavity size was normal in size. There is no left ventricular hypertrophy. Left ventricular diastolic parameters are indeterminate. Elevated left atrial pressure. Right Ventricle: The right ventricular size is normal. No increase in right ventricular wall thickness. Right ventricular systolic  function is normal. There is normal pulmonary artery systolic pressure. The tricuspid regurgitant velocity is 2.75 m/s, and  with an assumed right atrial pressure of 3 mmHg, the estimated right ventricular systolic pressure is 38.2 mmHg. Left Atrium: Left atrial size was normal in size. Right Atrium: Right atrial size was normal in size. Pericardium: There is no evidence of pericardial effusion. Mitral Valve: The mitral valve is normal in structure. No evidence of mitral valve regurgitation. Tricuspid Valve: The tricuspid valve is normal in structure. Tricuspid valve regurgitation is mild. Aortic Valve: The aortic valve is tricuspid. Aortic valve regurgitation is not visualized. Mild aortic valve sclerosis is present, with no evidence of aortic valve stenosis. Pulmonic Valve: The pulmonic valve was normal in structure. Pulmonic valve regurgitation is not visualized. Aorta: The aortic root is normal in size and structure. Venous: The inferior vena cava is normal in size with less than 50% respiratory variability, suggesting right atrial pressure of 8 mmHg. IAS/Shunts: The interatrial septum was not assessed.  LEFT VENTRICLE PLAX 2D LVIDd:         3.10 cm     Diastology LVIDs:         2.20 cm  LV e' lateral:   4.22 cm/s LV PW:         0.70 cm     LV E/e' lateral: 17.6 LV IVS:        0.70 cm     LV e' medial:    3.16 cm/s LVOT diam:     1.70 cm     LV E/e' medial:  23.4 LV SV:         52 LV SV Index:   35 LVOT Area:     2.27 cm  LV Volumes (MOD) LV vol d, MOD A2C: 60.1 ml LV vol d, MOD A4C: 38.4 ml LV vol s, MOD A2C: 25.3 ml LV vol s, MOD A4C: 18.1 ml LV SV MOD A2C:     34.8 ml LV SV MOD A4C:     38.4 ml LV SV MOD BP:      29.5 ml RIGHT VENTRICLE RV S prime:     9.16 cm/s TAPSE (M-mode): 1.5 cm LEFT ATRIUM           Index       RIGHT ATRIUM          Index LA diam:      1.60 cm 1.08 cm/m  RA Area:     9.52 cm LA Vol (A2C): 17.1 ml 11.50 ml/m RA Volume:   18.60 ml 12.51 ml/m LA Vol (A4C): 19.2 ml 12.92 ml/m   AORTIC VALVE LVOT Vmax:   105.00 cm/s LVOT Vmean:  82.500 cm/s LVOT VTI:    0.228 m  AORTA Ao Root diam: 2.90 cm MITRAL VALVE               TRICUSPID VALVE MV Area (PHT): 2.69 cm    TR Peak grad:   30.2 mmHg MV Decel Time: 282 msec    TR Vmax:        275.00 cm/s MV E velocity: 74.10 cm/s MV A velocity: 74.60 cm/s  SHUNTS MV E/A ratio:  0.99        Systemic VTI:  0.23 m                            Systemic Diam: 1.70 cm Dorris Carnes MD Electronically signed by Dorris Carnes MD Signature Date/Time: 01/26/2020/1:54:44 PM    Final     (Echo, Carotid, EGD, Colonoscopy, ERCP)    Subjective: No complaints feels great.  Discharge Exam: Vitals:   01/27/20 0323 01/27/20 0735  BP: 111/66 119/67  Pulse: 80 71  Resp: 18 16  Temp: 98.9 F (37.2 C) 98.3 F (36.8 C)  SpO2: 91% 97%   Vitals:   01/26/20 2023 01/27/20 0016 01/27/20 0323 01/27/20 0735  BP: 96/62 (!) 100/58 111/66 119/67  Pulse: 80 81 80 71  Resp:  '18 18 16  ' Temp: 98.7 F (37.1 C) 98.8 F (37.1 C) 98.9 F (37.2 C) 98.3 F (36.8 C)  TempSrc: Oral Oral Oral Oral  SpO2: 98% 91% 91% 97%  Weight:  46.3 kg    Height:        General: Pt is alert, awake, not in acute distress Cardiovascular: RRR, S1/S2 +, no rubs, no gallops Respiratory: CTA bilaterally, no wheezing, no rhonchi Abdominal: Soft, NT, ND, bowel sounds + Extremities: no edema, no cyanosis    The results of significant diagnostics from this hospitalization (including imaging, microbiology, ancillary and laboratory) are listed below for reference.     Microbiology: Recent Results (from the past  240 hour(s))  SARS Coronavirus 2 by RT PCR (hospital order, performed in [facility] hospital lab) Nasopharyngeal Nasopharyngeal Swab     Status: None   Collection Time: 01/24/20  8:20 PM   Specimen: Nasopharyngeal Swab  Result Value Ref Range Status   SARS Coronavirus 2 NEGATIVE NEGATIVE Final    Comment: (NOTE) SARS-CoV-2 target nucleic acids are NOT DETECTED.  The  SARS-CoV-2 RNA is generally detectable in upper and lower respiratory specimens during the acute phase of infection. The lowest concentration of SARS-CoV-2 viral copies this assay can detect is 250 copies / mL. A negative result does not preclude SARS-CoV-2 infection and should not be used as the sole basis for treatment or other patient management decisions.  A negative result may occur with improper specimen collection / handling, submission of specimen other than nasopharyngeal swab, presence of viral mutation(s) within the areas targeted by this assay, and inadequate number of viral copies (<250 copies / mL). A negative result must be combined with clinical observations, patient history, and epidemiological information.  Fact Sheet for Patients:   StrictlyIdeas.no  Fact Sheet for Healthcare Providers: BankingDealers.co.za  This test is not yet approved or  cleared by the [place] FDA and has been authorized for detection and/or diagnosis of SARS-CoV-2 by FDA under an Emergency Use Authorization (EUA).  This EUA will remain in effect (meaning this test can be used) for the duration of the COVID-19 declaration under Section 564(b)(1) of the Act, 21 U.S.C. section 360bbb-3(b)(1), unless the authorization is terminated or revoked sooner.  Performed at [facility], [address] [place], [place] [address]   Culture, Urine     Status: Abnormal (Preliminary result)   Collection Time: 01/25/20  5:04 AM   Specimen: Urine, Random  Result Value Ref Range Status   Specimen Description URINE, RANDOM  Final   Special Requests NONE  Final   Culture (A)  Final    >=100,000 COLONIES/mL ESCHERICHIA COLI SUSCEPTIBILITIES TO FOLLOW Performed at [facility], [address] [place], [place] [address]    Report Status PENDING  Incomplete     Labs: BNP (last 3 results) No results for input(s): BNP in the last 8760  hours. Basic Metabolic Panel: Recent Labs  Lab 01/24/20 1632 01/25/20 0942 01/26/20 0627  NA 138  --  141  K 3.6  --  3.7  CL 101  --  103  CO2 27  --  29  GLUCOSE 88  --  92  BUN 10  --  13  CREATININE 0.57  --  0.71  CALCIUM 9.0  --  9.1  MG  --  1.5*  --    Liver Function Tests: Recent Labs  Lab 01/24/20 1632  AST 23  ALT 17  ALKPHOS 73  BILITOT 1.0  PROT 7.3  ALBUMIN 3.6   No results for input(s): LIPASE, AMYLASE in the last 168 hours. No results for input(s): AMMONIA in the last 168 hours. CBC: Recent Labs  Lab 01/24/20 1632 01/26/20 0627  WBC 9.5 8.9  NEUTROABS 3.9  --   HGB 11.8* 10.3*  HCT 38.1 34.1*  MCV 100.5* 100.9*  PLT 209 198   Cardiac Enzymes: No results for input(s): CKTOTAL, CKMB, CKMBINDEX, TROPONINI in the last 168 hours. BNP: Invalid input(s): POCBNP CBG: No results for input(s): GLUCAP in the last 168 hours. D-Dimer Recent Labs    01/24/20 1632  DDIMER 2.11*   Hgb A1c Recent Labs  01/25/20 0942  HGBA1C 4.2*   Lipid Profile No results for input(s): CHOL, HDL, LDLCALC, TRIG, CHOLHDL, LDLDIRECT in the last 72 hours. Thyroid function studies Recent Labs    01/25/20 0942  TSH 5.379*   Anemia work up Recent Labs    01/25/20 0942  VITAMINB12 457  FOLATE 14.0  FERRITIN 524*  TIBC 267  IRON 52   Urinalysis    Component Value Date/Time   COLORURINE YELLOW 01/25/2020 0755   APPEARANCEUR HAZY (A) 01/25/2020 0755   LABSPEC 1.034 (H) 01/25/2020 0755   PHURINE 6.0 01/25/2020 0755   GLUCOSEU NEGATIVE 01/25/2020 0755   GLUCOSEU NEGATIVE 01/03/2020 1434   HGBUR SMALL (A) 01/25/2020 0755   BILIRUBINUR NEGATIVE 01/25/2020 0755   BILIRUBINUR N 01/24/2014 1354   KETONESUR NEGATIVE 01/25/2020 0755   PROTEINUR NEGATIVE 01/25/2020 0755   UROBILINOGEN 4.0 (A) 01/03/2020 1434   NITRITE NEGATIVE 01/25/2020 0755   LEUKOCYTESUR LARGE (A) 01/25/2020 0755   Sepsis Labs Invalid input(s): PROCALCITONIN,  WBC,   LACTICIDVEN Microbiology Recent Results (from the past 240 hour(s))  SARS Coronavirus 2 by RT PCR (hospital order, performed in [place] hospital lab) Nasopharyngeal Nasopharyngeal Swab     Status: None   Collection Time: 01/24/20  8:20 PM   Specimen: Nasopharyngeal Swab  Result Value Ref Range Status   SARS Coronavirus 2 NEGATIVE NEGATIVE Final    Comment: (NOTE) SARS-CoV-2 target nucleic acids are NOT DETECTED.  The SARS-CoV-2 RNA is generally detectable in upper and lower respiratory specimens during the acute phase of infection. The lowest concentration of SARS-CoV-2 viral copies this assay can detect is 250 copies / mL. A negative result does not preclude SARS-CoV-2 infection and should not be used as the sole basis for treatment or other patient management decisions.  A negative result may occur with improper specimen collection / handling, submission of specimen other than nasopharyngeal swab, presence of viral mutation(s) within the areas targeted by this assay, and inadequate number of viral copies (<250 copies / mL). A negative result must be combined with clinical observations, patient history, and epidemiological information.  Fact Sheet for Patients:   StrictlyIdeas.no  Fact Sheet for Healthcare Providers: BankingDealers.co.za  This test is not yet approved or  cleared by the [place] FDA and has been authorized for detection and/or diagnosis of SARS-CoV-2 by FDA under an Emergency Use Authorization (EUA).  This EUA will remain in effect (meaning this test can be used) for the duration of the COVID-19 declaration under Section 564(b)(1) of the Act, 21 U.S.C. section 360bbb-3(b)(1), unless the authorization is terminated or revoked sooner.  Performed at [facility], [address] [place], [place] [address]   Culture, Urine     Status: Abnormal (Preliminary result)   Collection Time: 01/25/20  5:04  AM   Specimen: Urine, Random  Result Value Ref Range Status   Specimen Description URINE, RANDOM  Final   Special Requests NONE  Final   Culture (A)  Final    >=100,000 COLONIES/mL ESCHERICHIA COLI SUSCEPTIBILITIES TO FOLLOW Performed at [facility], [address] [place], [place] [address]    Report Status PENDING  Incomplete     Time coordinating discharge: Over 30 minutes  SIGNED:   Charlynne Cousins, MD  [facility] 01/27/2020, 9:30 AM Pager   If 7PM-7AM, please contact night-coverage [email or web address] Password [email or web address]

## 2020-01-28 ENCOUNTER — Telehealth: Payer: Self-pay | Admitting: *Deleted

## 2020-01-28 NOTE — Telephone Encounter (Signed)
Transition Care Management Follow-up Telephone Call   Date discharged? 01/27/20   How have you been since you were released from the hospital? Pt states she is doing ok, but her head is still kind of sore   Do you understand why you were in the hospital? YES   Do you understand the discharge instructions? YES   Where were you discharged to? Home   Items Reviewed:  Medications reviewed: YES, Pt states her medications are all the same  Allergies reviewed: YES  Dietary changes reviewed: YES, heart healthy  Referrals reviewed: No referral recommeded   Functional Questionnaire:   Activities of Daily Living (ADLs):   She states she are independent in the following: ambulation, bathing and hygiene, feeding, continence, grooming, toileting and dressing States she doesn't require assistance    Any transportation issues/concerns?: NO   Any patient concerns? NO   Confirmed importance and date/time of follow-up visits scheduled YES, appt 02/04/20  Provider Appointment booked with Dr. Sharlet Salina  Confirmed with patient if condition begins to worsen call PCP or go to the ER.  Patient was given the office number and encouraged to call back with question or concerns.  : YES

## 2020-01-31 ENCOUNTER — Telehealth: Payer: Self-pay | Admitting: Internal Medicine

## 2020-01-31 DIAGNOSIS — E539 Vitamin B deficiency, unspecified: Secondary | ICD-10-CM | POA: Diagnosis not present

## 2020-01-31 DIAGNOSIS — M25512 Pain in left shoulder: Secondary | ICD-10-CM | POA: Diagnosis not present

## 2020-01-31 DIAGNOSIS — D509 Iron deficiency anemia, unspecified: Secondary | ICD-10-CM | POA: Diagnosis not present

## 2020-01-31 DIAGNOSIS — E785 Hyperlipidemia, unspecified: Secondary | ICD-10-CM | POA: Diagnosis not present

## 2020-01-31 DIAGNOSIS — E559 Vitamin D deficiency, unspecified: Secondary | ICD-10-CM | POA: Diagnosis not present

## 2020-01-31 DIAGNOSIS — J8417 Interstitial lung disease with progressive fibrotic phenotype in diseases classified elsewhere: Secondary | ICD-10-CM | POA: Diagnosis not present

## 2020-01-31 DIAGNOSIS — J8410 Pulmonary fibrosis, unspecified: Secondary | ICD-10-CM | POA: Diagnosis not present

## 2020-01-31 DIAGNOSIS — G4700 Insomnia, unspecified: Secondary | ICD-10-CM | POA: Diagnosis not present

## 2020-01-31 DIAGNOSIS — Z9981 Dependence on supplemental oxygen: Secondary | ICD-10-CM | POA: Diagnosis not present

## 2020-01-31 DIAGNOSIS — J9611 Chronic respiratory failure with hypoxia: Secondary | ICD-10-CM | POA: Diagnosis not present

## 2020-01-31 DIAGNOSIS — M069 Rheumatoid arthritis, unspecified: Secondary | ICD-10-CM | POA: Diagnosis not present

## 2020-01-31 DIAGNOSIS — I2510 Atherosclerotic heart disease of native coronary artery without angina pectoris: Secondary | ICD-10-CM | POA: Diagnosis not present

## 2020-01-31 DIAGNOSIS — Z86718 Personal history of other venous thrombosis and embolism: Secondary | ICD-10-CM | POA: Diagnosis not present

## 2020-01-31 NOTE — Telephone Encounter (Signed)
New Message:   Mickel Baas from [facility] is calling and requesting orders for PT for  2x a week for 3 weeks and 1x a week for 3 weeks. She is also requesting a order for a Copywriter, advertising. Lastly she is requesting a order for some ted hose for the pt. Please advise.

## 2020-02-03 ENCOUNTER — Encounter: Payer: Self-pay | Admitting: Podiatry

## 2020-02-03 ENCOUNTER — Other Ambulatory Visit: Payer: Self-pay

## 2020-02-03 ENCOUNTER — Other Ambulatory Visit: Payer: Self-pay | Admitting: *Deleted

## 2020-02-03 ENCOUNTER — Telehealth: Payer: Self-pay | Admitting: Pulmonary Disease

## 2020-02-03 ENCOUNTER — Ambulatory Visit (INDEPENDENT_AMBULATORY_CARE_PROVIDER_SITE_OTHER): Payer: Medicare Other | Admitting: Podiatry

## 2020-02-03 DIAGNOSIS — B351 Tinea unguium: Secondary | ICD-10-CM | POA: Diagnosis not present

## 2020-02-03 DIAGNOSIS — M79671 Pain in right foot: Secondary | ICD-10-CM | POA: Diagnosis not present

## 2020-02-03 DIAGNOSIS — L84 Corns and callosities: Secondary | ICD-10-CM | POA: Diagnosis not present

## 2020-02-03 DIAGNOSIS — M79672 Pain in left foot: Secondary | ICD-10-CM | POA: Diagnosis not present

## 2020-02-03 DIAGNOSIS — M79674 Pain in right toe(s): Secondary | ICD-10-CM | POA: Diagnosis not present

## 2020-02-03 DIAGNOSIS — M79675 Pain in left toe(s): Secondary | ICD-10-CM

## 2020-02-03 MED ORDER — NONFORMULARY OR COMPOUNDED ITEM
3 refills | Status: DC
Start: 2020-02-03 — End: 2020-08-17

## 2020-02-03 NOTE — Telephone Encounter (Signed)
ATC Patient. Left message for Patient to call back on home number, unable to leave message on cell, mailbox full. Patient needs follow up appointment with Dr Vaughan Browner.  Dr. Vaughan Browner has openings 02/07/20, and would like her scheduled for follow up.

## 2020-02-03 NOTE — Telephone Encounter (Signed)
Fine

## 2020-02-03 NOTE — Patient Outreach (Signed)
[facility]) Care Management  02/03/2020  [name] [name] 1931-06-04 [id number]   RED ON EMMI ALERT - General Discharge Day # 4 Date: 7/24 Red Alert Reason: Other questions/problems   Outreach attempt #1, successful however member request call back after 4 pm.      Update @ 1640:  Call placed to member as she requested after 4 pm, no answer.  HIPAA compliant voice message left.   Plan: RN CM will send unsuccessful outreach letter and follow up within the next 3-4 business days.  Valente David, [place], MSN [place] [phone]

## 2020-02-03 NOTE — Patient Instructions (Addendum)
Shoe Recommendations: 1.  Oofos recovery shoes at www.oofos.com or 2.  Skechers shoes with memory foam insoles and stretchable uppers.  [place] [phone] Antifungal nail solution

## 2020-02-03 NOTE — Telephone Encounter (Signed)
Verbal orders given via VM 

## 2020-02-04 ENCOUNTER — Encounter: Payer: Self-pay | Admitting: Internal Medicine

## 2020-02-04 ENCOUNTER — Ambulatory Visit (INDEPENDENT_AMBULATORY_CARE_PROVIDER_SITE_OTHER): Payer: Medicare Other | Admitting: Internal Medicine

## 2020-02-04 DIAGNOSIS — J9611 Chronic respiratory failure with hypoxia: Secondary | ICD-10-CM | POA: Diagnosis not present

## 2020-02-04 DIAGNOSIS — J8410 Pulmonary fibrosis, unspecified: Secondary | ICD-10-CM | POA: Diagnosis not present

## 2020-02-04 DIAGNOSIS — M069 Rheumatoid arthritis, unspecified: Secondary | ICD-10-CM | POA: Diagnosis not present

## 2020-02-04 DIAGNOSIS — I2510 Atherosclerotic heart disease of native coronary artery without angina pectoris: Secondary | ICD-10-CM | POA: Diagnosis not present

## 2020-02-04 DIAGNOSIS — M25512 Pain in left shoulder: Secondary | ICD-10-CM | POA: Diagnosis not present

## 2020-02-04 DIAGNOSIS — J8417 Interstitial lung disease with progressive fibrotic phenotype in diseases classified elsewhere: Secondary | ICD-10-CM | POA: Diagnosis not present

## 2020-02-04 DIAGNOSIS — D509 Iron deficiency anemia, unspecified: Secondary | ICD-10-CM | POA: Diagnosis not present

## 2020-02-04 NOTE — Progress Notes (Signed)
   Subjective:   Patient ID: [name] [name], female    DOB: [birth year], 86 y.o.   MRN: [id number]  HPI The patient is an 86 YO female coming in for hospital follow up (in for fall with sutures on forehead, CT scan head and neck, and medications adjusted for some dizziness). She has not fallen since leaving hospital and is not as dizzy since the medication changes. She denies great appetite but is drinking fluids and eating some. Denies fevers or chills. SOB is stable and using oxygen all the time. Denies pain and is ready to get sutures out of head. Denies headaches or confusion. Working with PT now and feels that this is very helpful.   PMH, [facility], social history reviewed and updated  Review of Systems  Constitutional: Positive for activity change and fatigue.  HENT: Negative.   Eyes: Negative.   Respiratory: Negative for cough, chest tightness and shortness of breath.   Cardiovascular: Negative for chest pain, palpitations and leg swelling.  Gastrointestinal: Negative for abdominal distention, abdominal pain, constipation, diarrhea, nausea and vomiting.  Musculoskeletal: Negative.   Skin: Negative.   Neurological: Positive for weakness.  Psychiatric/Behavioral: Negative.     Objective:  Physical Exam Constitutional:      Appearance: She is well-developed.  HENT:     Head: Normocephalic and atraumatic.     Comments: 3 sutures removed from upper right forehead. Cardiovascular:     Rate and Rhythm: Normal rate and regular rhythm.  Pulmonary:     Effort: Pulmonary effort is normal. No respiratory distress.     Breath sounds: Wheezing present. No rales.     Comments: Stable lung exam, oxygen in place Abdominal:     General: Bowel sounds are normal. There is no distension.     Palpations: Abdomen is soft.     Tenderness: There is no abdominal tenderness. There is no rebound.  Musculoskeletal:     Cervical back: Normal range of motion.  Skin:    General: Skin is warm and dry.    Neurological:     Mental Status: She is alert and oriented to person, place, and time.     Coordination: Coordination abnormal.     Comments: Slow to stand and slow but steady gait     Vitals:   02/04/20 1423  BP: 116/78  Pulse: 85  Temp: 97.9 F (36.6 C)  TempSrc: Oral  SpO2: 92%  Weight: 105 lb (47.6 kg)  Height: 5' 4.5" (1.638 m)    This visit occurred during the SARS-CoV-2 public health emergency.  Safety protocols were in place, including screening questions prior to the visit, additional usage of staff PPE, and extensive cleaning of exam room while observing appropriate contact time as indicated for disinfecting solutions.   Assessment & Plan:

## 2020-02-04 NOTE — Telephone Encounter (Signed)
ATC Patient.  Left detailed message to call back to schedule OV 02/07/20. Patient does have upcoming follow up ILD appointment already scheduled 03/10/20, with Dr Vaughan Browner.

## 2020-02-04 NOTE — Patient Instructions (Addendum)
Let us know how you are doing with therapy and if you need more.

## 2020-02-05 NOTE — Progress Notes (Signed)
Subjective:  Patient ID: [name] [name], female    DOB: 1931-06-01,  MRN: [id number]  [name] [name] presents to clinic today for painful callus(es) b/l feet and painful thick toenails that are difficult to trim. Pain interferes with ambulation. Aggravating factors include wearing enclosed shoe gear. Pain is relieved with periodic professional debridement..  86 y.o. female presents with the above complaint.  Reports painfully elongated nails to both feet.  Review of Systems: Negative except as noted in the HPI. Past Medical History:  Diagnosis Date   Allergic rhinitis    Allergy    CAD (coronary artery disease)    Mild CAD by cath 2008   DJD (degenerative joint disease)    rheumatoid   DVT (deep venous thrombosis) ([place])    Dyslipidemia    History of echocardiogram    Echo 12/2018: EF 60-65, mod asymmetric LVH, Gr 1 DD   History of nuclear stress test    Myoview 5/17:  EF 74%, normal perfusion, low risk // Myoview 12/2018:  EF 89, very mild ischemia in inferoapical wall; Low Risk   History of pulmonary embolism    Hyperlipidemia    Insomnia    Osteoporosis    Psoriasis    Pulmonary fibrosis (HCC)    Rheumatoid arthritis ([place])    Past Surgical History:  Procedure Laterality Date   ABDOMINAL HYSTERECTOMY  1974   APPENDECTOMY  1959   CATARACT EXTRACTION     LEFT HEART CATHETERIZATION WITH CORONARY ANGIOGRAM N/A 06/18/2013   Procedure: LEFT HEART CATHETERIZATION WITH CORONARY ANGIOGRAM;  Surgeon: Peter M Martinique, MD;  Location: [facility] CATH LAB;  Service: Cardiovascular;  Laterality: N/A;   [place], 2007   Bilateral    Current Outpatient Medications:    aspirin EC 81 MG tablet, Take 1 tablet (81 mg total) by mouth daily., Disp: , Rfl:    atorvastatin (LIPITOR) 40 MG tablet, Take 1 tablet (40 mg total) by mouth daily., Disp: 90 tablet, Rfl: 3   dapsone 100 MG tablet, TAKE 1 TABLET(100 MG) BY MOUTH DAILY (Patient  taking differently: Take 100 mg by mouth daily. ), Disp: 90 tablet, Rfl: 1   fluticasone (FLONASE) 50 MCG/ACT nasal spray, Place 2 sprays into both nostrils daily as needed for allergies or rhinitis., Disp: 16 g, Rfl: 3   furosemide (LASIX) 20 MG tablet, Take 1 tablet by mouth daily as needed for swelling. (Patient taking differently: Take 20 mg by mouth daily as needed for fluid or edema. ), Disp: 30 tablet, Rfl: 11   InFLIXimab (REMICADE IV), Inject 5 mg into the vein every 14 (fourteen) days. , Disp: , Rfl:    levalbuterol (XOPENEX HFA) 45 MCG/ACT inhaler, Inhale 2 puffs into the lungs every 6 (six) hours as needed for wheezing., Disp: 1 Inhaler, Rfl: 12   metoprolol succinate (TOPROL XL) 25 MG 24 hr tablet, Take 1 tablet (25 mg total) by mouth daily., Disp: 30 tablet, Rfl: 0   mirtazapine (REMERON) 15 MG tablet, Take 1 tablet (15 mg total) by mouth at bedtime., Disp: 90 tablet, Rfl: 3   mycophenolate (CELLCEPT) 500 MG tablet, 2 tabs bid, Disp: 360 tablet, Rfl: 3   nitroGLYCERIN (NITROSTAT) 0.4 MG SL tablet, Place 1 tablet (0.4 mg total) under the tongue every 5 (five) minutes as needed for chest pain., Disp: 25 tablet, Rfl: 3   NONFORMULARY OR COMPOUNDED ITEM, Antifungal solution: Terbinafine 3%, Fluconazole 2%, Tea Tree Oil 5%, Urea 10%, Ibuprofen  2% in DMSO suspension #54mL, Disp: 1 each, Rfl: 3   nystatin cream (MYCOSTATIN), Apply topically., Disp: , Rfl:    Polyethyl Glycol-Propyl Glycol (SYSTANE) 0.4-0.3 % SOLN, Apply 1 drop to eye daily as needed (for dry eyes)., Disp: , Rfl:    polyethylene glycol powder (GLYCOLAX/MIRALAX) powder, Take 17 g by mouth 2 (two) times daily as needed. (Patient taking differently: Take 17 g by mouth 2 (two) times daily as needed for mild constipation. ), Disp: 3350 g, Rfl: 1   predniSONE (DELTASONE) 5 MG tablet, Take 1 tablet (5 mg total) by mouth daily with breakfast., Disp: 90 tablet, Rfl: 1   triamcinolone cream (KENALOG) 0.1 %, Apply 1  application topically 2 (two) times daily., Disp: 453.6 g, Rfl: 0   valACYclovir (VALTREX) 1000 MG tablet, TAKE 1 TABLET TWICE A DAY, Disp: 60 tablet, Rfl: 11 Allergies  Allergen Reactions   Crestor [Rosuvastatin] Other (See Comments)    Muscle pain   Lactose Intolerance (Gi) Other (See Comments)    Stomach upset   Penicillins Hives   Imuran [Azathioprine] Nausea And Vomiting   Niacin Hives   Social History   Occupational History   Occupation: Publishing rights manager    Comment: retired  Tobacco Use   Smoking status: Former Smoker    Packs/day: 0.50    Years: 20.00    Pack years: 10.00    Types: Cigarettes    Quit date: 07/11/1973    Years since quitting: 46.6   Smokeless tobacco: Never Used  Vaping Use   Vaping Use: Never used  Substance and Sexual Activity   Alcohol use: No   Drug use: No   Sexual activity: Never    Objective:   Constitutional [name] [name] is a pleasant 86 y.o. African American female, WD, WN in NAD.[name] AAO x 3.   Vascular Dorsalis pedis pulses palpable bilaterally. Posterior tibial pulses palpable bilaterally. Capillary refill normal to all digits.  No cyanosis or clubbing noted. Pedal hair growth diminished b/l. Lower extremity skin temperature gradient within normal limits. No edema noted b/l lower extremities.  Neurologic Normal speech. Oriented to person, place, and time. Epicritic sensation to light touch grossly present bilaterally. Protective sensation intact 5/5 intact bilaterally with 10g monofilament b/l. Vibratory sensation intact b/l.  Dermatologic Pedal skin with normal turgor, texture and tone b/l.  Toenails are discolored, thick, elongated, dystrophic with pain on palpation x 10 No open wounds. No skin lesions. No interdigital macerations bilaterally. Hyperkeratotic lesion(s) L hallux, R hallux, submet head 1 left foot and submet head 4 right foot.  No erythema, no edema, no drainage, no flocculence.  Orthopedic:  Normal muscle strength 5/5 to all lower extremity muscle groups bilaterally. Hallux valgus with bunion deformity noted b/l lower extremities. No bony tenderness.    Radiographs: None Assessment:   1. Pain due to onychomycosis of toenails of both feet   2. Callus   3. Pain in both feet    Plan:  Patient was evaluated and treated and all questions answered.  Onychomycosis with pain -Nails palliatively debridement as below -Educated on self-care  Procedure: Nail Debridement Rationale: Pain Type of Debridement: manual, sharp debridement. Instrumentation: Nail nipper, rotary burr. Number of Nails: 10 -Examined patient. -Medicare ABN signed for this year for paring of calluses. Copy given to patient on today's visit and copy placed in patient's chart. -Discussed topical, laser and oral medication. Patient opted for topical treatment with compounded medication. Rx written for nonformulary compounding topical antifungal: [facility]:  Antifungal cream - Terbinafine 3%, Fluconazole 2%, Tea Tree Oil 5%, Urea 10%, Ibuprofen 2% in DMSO Suspension #64ml. Apply to the affected nail(s) at bedtime. -Toenails 1-5 b/l were debrided in length and girth with sterile nail nippers and dremel without iatrogenic bleeding.  -Callus(es) L hallux, R hallux, submet head 1 left foot and submet head 4 right foot pared utilizing sterile scalpel blade without complication or incident. Total number debrided =4. -Patient to report any pedal injuries to medical professional immediately. -Recommended Oofos Recovery Shoes or Skechers with memory foam insoles. -Patient to continue soft, supportive shoe gear daily. -Patient/POA to call should there be question/concern in the interim.  Return in about 3 months (around 05/05/2020) for nail trim.  Marzetta Board, DPM

## 2020-02-06 ENCOUNTER — Encounter: Payer: Self-pay | Admitting: Internal Medicine

## 2020-02-06 ENCOUNTER — Other Ambulatory Visit: Payer: Self-pay | Admitting: *Deleted

## 2020-02-06 ENCOUNTER — Encounter: Payer: Self-pay | Admitting: *Deleted

## 2020-02-06 DIAGNOSIS — J9611 Chronic respiratory failure with hypoxia: Secondary | ICD-10-CM | POA: Diagnosis not present

## 2020-02-06 DIAGNOSIS — D509 Iron deficiency anemia, unspecified: Secondary | ICD-10-CM | POA: Diagnosis not present

## 2020-02-06 DIAGNOSIS — I2510 Atherosclerotic heart disease of native coronary artery without angina pectoris: Secondary | ICD-10-CM | POA: Diagnosis not present

## 2020-02-06 DIAGNOSIS — M25512 Pain in left shoulder: Secondary | ICD-10-CM | POA: Diagnosis not present

## 2020-02-06 DIAGNOSIS — J8417 Interstitial lung disease with progressive fibrotic phenotype in diseases classified elsewhere: Secondary | ICD-10-CM | POA: Diagnosis not present

## 2020-02-06 DIAGNOSIS — M069 Rheumatoid arthritis, unspecified: Secondary | ICD-10-CM | POA: Diagnosis not present

## 2020-02-06 NOTE — Assessment & Plan Note (Signed)
Stable on oxygen and doing PT now to help with mobility and strength. Stitches removed from forehead during visit.

## 2020-02-06 NOTE — Patient Outreach (Signed)
[facility]) Care Management  02/06/2020  [name] [name] [birth year] [id number]   RED ON EMMI ALERT - General Discharge Day # 4 Date: 7/24 Red Alert Reason: Other questions/problems   Outreach attempt #2, successful, identity verified.  This care manager introduced self and stated purpose of call.  [facility] care management services explained.  Social: Member lives alone but is independent in ADL's.  Report she was in the hospital recently due to having 2 falls within 2 weeks.  State she isn't as "swift" as she usually is but is improving.  She has been active with home health for PT.  Feels the falls may have been a side effect of her medications.    Conditions: Per chart, has history of CAD, CHF, Dyslipidemia, and anemia.  Medications: Reviewed with member, denies any question.  Report adherence to medication changes since discharge.  Denies need for financial assistance.  Appointments: Was seen by PCP on 7/27, will have follow up with pulmonology on 8/4.  Denies need for transportation assistance.  Either her friend or nephew will take her.  Despite above noted red alert, denies any problems/questions.  Encouraged to contact this care manager with concerns.    Plan: RN CM will follow up within the next 2 weeks.  Goals Addressed              This Visit's Progress   .  Decrease falls and increase activity (pt-stated)        CARE PLAN ENTRY (see longitudinal plan of care for additional care plan information)  Current Barriers:  [name] Knowledge Deficits related to fall precautions . Decreased adherence to prescribed treatment for fall prevention . Knowledge Deficits related to use of DME  Clinical Goal(s):  [name] Over the next 31 days, patient will demonstrate improved adherence to prescribed treatment plan for decreasing falls as evidenced by patient reporting and review of EMR . Over the next 28days, patient will verbalize using fall risk reduction strategies  discussed . Over the next 28 days, patient will not experience additional falls   Interventions:  . Provided written and verbal education re: Potential causes of falls and Fall prevention strategies . Reviewed medications and discussed potential side effects of medications such as dizziness and frequent urination . Assessed for s/s of orthostatic hypotension . Assessed for falls since last encounter. . Assessed patients knowledge of fall risk prevention secondary to previously provided education. . Assessed working status of life alert bracelet and patient adherence . Provided patient information for fall alert systems . Evaluation of current treatment plan related to decreasing falls and patient's adherence to plan as established by provider. . Discussed plans with patient for ongoing care management follow up and provided patient with direct contact information for care management team . Reviewed scheduled/upcoming provider appointments including:   Patient Self Care Activities:  . Utilize cane/walker (assistive device) appropriately with all ambulation . De-clutter walkways . Change positions slowly . Wear secure fitting shoes at all times with ambulation . Utilize home lighting for dim lit areas . Have self and pet awareness at all times  Plan: . CCM RN CM will follow up in 2 weeks   Initial goal documentation       Valente David, RN, MSN [place] [phone]

## 2020-02-06 NOTE — Assessment & Plan Note (Signed)
Stable, on infliximab and prednisone without flare today.

## 2020-02-06 NOTE — Assessment & Plan Note (Signed)
Medications adjusted while in hospital and will be following up with pulmonary. She is having less dizziness since change in medications.

## 2020-02-10 DIAGNOSIS — J9611 Chronic respiratory failure with hypoxia: Secondary | ICD-10-CM | POA: Diagnosis not present

## 2020-02-10 DIAGNOSIS — M069 Rheumatoid arthritis, unspecified: Secondary | ICD-10-CM | POA: Diagnosis not present

## 2020-02-10 DIAGNOSIS — D509 Iron deficiency anemia, unspecified: Secondary | ICD-10-CM | POA: Diagnosis not present

## 2020-02-10 DIAGNOSIS — I2510 Atherosclerotic heart disease of native coronary artery without angina pectoris: Secondary | ICD-10-CM | POA: Diagnosis not present

## 2020-02-10 DIAGNOSIS — M25512 Pain in left shoulder: Secondary | ICD-10-CM | POA: Diagnosis not present

## 2020-02-10 DIAGNOSIS — J8417 Interstitial lung disease with progressive fibrotic phenotype in diseases classified elsewhere: Secondary | ICD-10-CM | POA: Diagnosis not present

## 2020-02-12 ENCOUNTER — Encounter (HOSPITAL_BASED_OUTPATIENT_CLINIC_OR_DEPARTMENT_OTHER): Payer: Self-pay

## 2020-02-12 ENCOUNTER — Emergency Department (HOSPITAL_BASED_OUTPATIENT_CLINIC_OR_DEPARTMENT_OTHER): Payer: Medicare Other

## 2020-02-12 ENCOUNTER — Other Ambulatory Visit: Payer: Self-pay

## 2020-02-12 ENCOUNTER — Emergency Department (HOSPITAL_BASED_OUTPATIENT_CLINIC_OR_DEPARTMENT_OTHER)
Admission: EM | Admit: 2020-02-12 | Discharge: 2020-02-12 | Disposition: A | Discharge status: Home / Self Care | Payer: Medicare Other | Attending: Emergency Medicine | Admitting: Emergency Medicine

## 2020-02-12 ENCOUNTER — Ambulatory Visit: Payer: Medicare Other | Admitting: Pulmonary Disease

## 2020-02-12 ENCOUNTER — Telehealth: Payer: Self-pay | Admitting: Pulmonary Disease

## 2020-02-12 DIAGNOSIS — J80 Acute respiratory distress syndrome: Secondary | ICD-10-CM | POA: Diagnosis not present

## 2020-02-12 DIAGNOSIS — Z7982 Long term (current) use of aspirin: Secondary | ICD-10-CM | POA: Insufficient documentation

## 2020-02-12 DIAGNOSIS — J9611 Chronic respiratory failure with hypoxia: Secondary | ICD-10-CM | POA: Diagnosis not present

## 2020-02-12 DIAGNOSIS — R079 Chest pain, unspecified: Secondary | ICD-10-CM | POA: Diagnosis not present

## 2020-02-12 DIAGNOSIS — Z87891 Personal history of nicotine dependence: Secondary | ICD-10-CM | POA: Insufficient documentation

## 2020-02-12 DIAGNOSIS — M069 Rheumatoid arthritis, unspecified: Secondary | ICD-10-CM | POA: Diagnosis not present

## 2020-02-12 DIAGNOSIS — D509 Iron deficiency anemia, unspecified: Secondary | ICD-10-CM | POA: Diagnosis not present

## 2020-02-12 DIAGNOSIS — I2510 Atherosclerotic heart disease of native coronary artery without angina pectoris: Secondary | ICD-10-CM | POA: Diagnosis not present

## 2020-02-12 DIAGNOSIS — R0600 Dyspnea, unspecified: Secondary | ICD-10-CM

## 2020-02-12 DIAGNOSIS — J8410 Pulmonary fibrosis, unspecified: Secondary | ICD-10-CM | POA: Diagnosis not present

## 2020-02-12 DIAGNOSIS — R0602 Shortness of breath: Secondary | ICD-10-CM | POA: Diagnosis not present

## 2020-02-12 DIAGNOSIS — I10 Essential (primary) hypertension: Secondary | ICD-10-CM | POA: Diagnosis not present

## 2020-02-12 DIAGNOSIS — Z96653 Presence of artificial knee joint, bilateral: Secondary | ICD-10-CM | POA: Insufficient documentation

## 2020-02-12 DIAGNOSIS — I5032 Chronic diastolic (congestive) heart failure: Secondary | ICD-10-CM | POA: Diagnosis not present

## 2020-02-12 DIAGNOSIS — M25512 Pain in left shoulder: Secondary | ICD-10-CM | POA: Diagnosis not present

## 2020-02-12 DIAGNOSIS — J8417 Interstitial lung disease with progressive fibrotic phenotype in diseases classified elsewhere: Secondary | ICD-10-CM | POA: Diagnosis not present

## 2020-02-12 LAB — CBC WITH DIFFERENTIAL/PLATELET
Abs Immature Granulocytes: 0.1 10*3/uL — ABNORMAL HIGH (ref 0.00–0.07)
Basophils Absolute: 0.1 10*3/uL (ref 0.0–0.1)
Basophils Relative: 1 %
Eosinophils Absolute: 0.2 10*3/uL (ref 0.0–0.5)
Eosinophils Relative: 2 %
HCT: 32.3 % — ABNORMAL LOW (ref 36.0–46.0)
Hemoglobin: 10 g/dL — ABNORMAL LOW (ref 12.0–15.0)
Immature Granulocytes: 1 %
Lymphocytes Relative: 37 %
Lymphs Abs: 4.2 10*3/uL — ABNORMAL HIGH (ref 0.7–4.0)
MCH: 31.1 pg (ref 26.0–34.0)
MCHC: 31 g/dL (ref 30.0–36.0)
MCV: 100.3 fL — ABNORMAL HIGH (ref 80.0–100.0)
Monocytes Absolute: 1.1 10*3/uL — ABNORMAL HIGH (ref 0.1–1.0)
Monocytes Relative: 9 %
Neutro Abs: 5.9 10*3/uL (ref 1.7–7.7)
Neutrophils Relative %: 50 %
Platelets: 229 10*3/uL (ref 150–400)
RBC: 3.22 MIL/uL — ABNORMAL LOW (ref 3.87–5.11)
RDW: 15.5 % (ref 11.5–15.5)
WBC: 11.6 10*3/uL — ABNORMAL HIGH (ref 4.0–10.5)
nRBC: 0 % (ref 0.0–0.2)

## 2020-02-12 LAB — BASIC METABOLIC PANEL
Anion gap: 11 (ref 5–15)
BUN: 15 mg/dL (ref 8–23)
CO2: 27 mmol/L (ref 22–32)
Calcium: 8.8 mg/dL — ABNORMAL LOW (ref 8.9–10.3)
Chloride: 101 mmol/L (ref 98–111)
Creatinine, Ser: 0.58 mg/dL (ref 0.44–1.00)
GFR calc Af Amer: >60 mL/min (ref >60)
GFR calc non Af Amer: >60 mL/min (ref >60)
Glucose, Bld: 94 mg/dL (ref 70–99)
Potassium: 3.8 mmol/L (ref 3.5–5.1)
Sodium: 139 mmol/L (ref 135–145)

## 2020-02-12 LAB — BRAIN NATRIURETIC PEPTIDE: B Natriuretic Peptide: 62.2 pg/mL (ref 0.0–100.0)

## 2020-02-12 LAB — TROPONIN I (HIGH SENSITIVITY): Troponin I (High Sensitivity): 2 ng/L (ref <18)

## 2020-02-12 NOTE — ED Notes (Signed)
Attempted IV access times 2. With [place]. Veins collapsing. Adrienne RN informed.

## 2020-02-12 NOTE — ED Notes (Signed)
Attempted IV x 1; pt jerked LUE away as needle was inserted; unable to obtain IV; Dr. Wilson Singer to bedside and attempted IV, arterial stick to RUE and RT femoral artery stick; obtained labs with femoral stick.

## 2020-02-12 NOTE — Telephone Encounter (Signed)
Please take to emergency room

## 2020-02-12 NOTE — ED Triage Notes (Signed)
Pt c/o CP, SOB "for a while"-worse x 3 days-pt states she is on O2 "but I don't need it when I'm resting"-not wearing O2 when she entered triage-DOE noted

## 2020-02-12 NOTE — ED Provider Notes (Signed)
[facility] EMERGENCY DEPARTMENT Provider Note   CSN: [id number] Arrival date & time: 02/12/20  1714     History Chief Complaint  Patient presents with  . Chest Pain  . Shortness of Breath    [name] [name] is a 86 y.o. female.  HPI   86 year old female with chest pain and shortness of breath.  Onset her chest pain was this morning around 5 AM while laying in bed.  She is not exactly sure how long it lasted but estimates less than an hour and she fell back asleep.  No recurrence since then.  She describes some pain in her upper chest today and in between her shoulder blades.  Associated with mild dyspnea.  She has baseline dyspnea but for like her breathing was little bit worse at that time.  Denies any nausea or palpitations.  No fevers or chills.  She has some baseline lower extremity edema but denies any acute change in this.  She reports today she had a pulmonology appointment and she was noted to be high toxic.  She is not sure how low though.  She was advised to come the emergency room for further evaluation.  She is normally on 2 L of oxygen via nasal cannula at baseline.  Past Medical History:  Diagnosis Date  . Allergic rhinitis   . Allergy   . CAD (coronary artery disease)    Mild CAD by cath 2008  . DJD (degenerative joint disease)    rheumatoid  . DVT (deep venous thrombosis) ([place])   . Dyslipidemia   . History of echocardiogram    Echo 12/2018: EF 60-65, mod asymmetric LVH, Gr 1 DD  . History of nuclear stress test    Myoview 5/17:  EF 74%, normal perfusion, low risk // Myoview 12/2018:  EF 89, very mild ischemia in inferoapical wall; Low Risk  . History of pulmonary embolism   . Hyperlipidemia   . Insomnia   . Osteoporosis   . Psoriasis   . Pulmonary fibrosis ([place])   . Rheumatoid arthritis [facility])     Patient Active Problem List   Diagnosis Date Noted  . Fall 01/25/2020  . Other fatigue 01/03/2020  . Left shoulder pain 01/03/2020  . Urinary frequency  01/03/2020  . Unintentional weight loss 07/18/2019  . Personal history of PE (pulmonary embolism) 04/23/2019  . Headache 02/11/2019  . Iron deficiency anemia 12/21/2018  . Cold sore 10/23/2018  . Blood in stool 09/14/2018  . Therapeutic drug monitoring 07/17/2018  . Chronic diastolic CHF (congestive heart failure) ([place]) 12/19/2017  . Rash 08/11/2017  . Chronic respiratory failure with hypoxia ([place]) 03/30/2017  . Angular cheilitis 03/08/2017  . Leg pain 10/25/2016  . Back pain 08/05/2016  . RA (rheumatoid arthritis) ([place]) 03/31/2015  . Allergic rhinitis   . Varicose vein 09/11/2014  . Chest pain 02/27/2012  . CAD (coronary artery disease) 02/27/2012  . Pruritus 08/18/2009  . Postinflammatory pulmonary fibrosis / RA ILD  06/30/2008  . Constipation 01/03/2008  . Dyslipidemia 06/16/2007  . Personal history of DVT (deep vein thrombosis) 06/16/2007    Past Surgical History:  Procedure Laterality Date  . ABDOMINAL HYSTERECTOMY  1974  . APPENDECTOMY  1959  . CATARACT EXTRACTION    . LEFT HEART CATHETERIZATION WITH CORONARY ANGIOGRAM N/A 06/18/2013   Procedure: LEFT HEART CATHETERIZATION WITH CORONARY ANGIOGRAM;  Surgeon: Peter M Martinique, MD;  Location: [facility] CATH LAB;  Service: Cardiovascular;  Laterality: N/A;  . TONSILLECTOMY  1941, 1951  .  TOTAL KNEE ARTHROPLASTY  1997, 2007   Bilateral     OB History    Gravida  4   Para  2   Term  2   Preterm      AB      Living  0     SAB      TAB      Ectopic      Multiple      Live Births              Family History  Problem Relation Age of Onset  . Kidney disease Daughter 5  . Cancer Daughter   . Heart attack Father 51  . Alzheimer's disease Sister   . Heart disease Sister   . Alzheimer's disease Sister     Social History   Tobacco Use  . Smoking status: Former Smoker    Packs/day: 0.50    Years: 20.00    Pack years: 10.00    Types: Cigarettes    Quit date: 07/11/1973    Years since quitting: 46.6  .  Smokeless tobacco: Never Used  Vaping Use  . Vaping Use: Never used  Substance Use Topics  . Alcohol use: No  . Drug use: No    Home Medications Prior to Admission medications   Medication Sig Start Date End Date Taking? Authorizing Provider  aspirin EC 81 MG tablet Take 1 tablet (81 mg total) by mouth daily. 11/30/15   Richardson Dopp T, PA-C  atorvastatin (LIPITOR) 40 MG tablet Take 1 tablet (40 mg total) by mouth daily. 04/19/19   Burnell Blanks, MD  dapsone 100 MG tablet TAKE 1 TABLET(100 MG) BY MOUTH DAILY Patient taking differently: Take 100 mg by mouth daily.  12/03/19   Mannam, Hart Robinsons, MD  fluticasone (FLONASE) 50 MCG/ACT nasal spray Place 2 sprays into both nostrils daily as needed for allergies or rhinitis. 03/17/15   Hoyt Koch, MD  furosemide (LASIX) 20 MG tablet Take 1 tablet by mouth daily as needed for swelling. Patient taking differently: Take 20 mg by mouth daily as needed for fluid or edema.  12/11/18   Burtis Junes, NP  InFLIXimab (REMICADE IV) Inject 5 mg into the vein every 14 (fourteen) days.     [provider]  levalbuterol Penne Lash HFA) 45 MCG/ACT inhaler Inhale 2 puffs into the lungs every 6 (six) hours as needed for wheezing. 06/05/17   Magdalen Spatz, NP  metoprolol succinate (TOPROL XL) 25 MG 24 hr tablet Take 1 tablet (25 mg total) by mouth daily. 12/12/18   Richardson Dopp T, PA-C  mirtazapine (REMERON) 15 MG tablet Take 1 tablet (15 mg total) by mouth at bedtime. 07/18/19   Hoyt Koch, MD  mycophenolate (CELLCEPT) 500 MG tablet 2 tabs bid 01/27/20   Charlynne Cousins, MD  nitroGLYCERIN (NITROSTAT) 0.4 MG SL tablet Place 1 tablet (0.4 mg total) under the tongue every 5 (five) minutes as needed for chest pain. 12/12/18 01/25/20  Richardson Dopp T, PA-C  NONFORMULARY OR COMPOUNDED ITEM Antifungal solution: Terbinafine 3%, Fluconazole 2%, Tea Tree Oil 5%, Urea 10%, Ibuprofen 2% in DMSO suspension #20mL 02/03/20   [place], Stephani Police, DPM   nystatin cream (MYCOSTATIN) Apply topically. 01/16/20   [provider]  Polyethyl Glycol-Propyl Glycol (SYSTANE) 0.4-0.3 % SOLN Apply 1 drop to eye daily as needed (for dry eyes).    [provider]  polyethylene glycol powder (GLYCOLAX/MIRALAX) powder Take 17 g by mouth 2 (two) times daily  as needed. Patient taking differently: Take 17 g by mouth 2 (two) times daily as needed for mild constipation.  09/13/18   Hoyt Koch, MD  predniSONE (DELTASONE) 5 MG tablet Take 1 tablet (5 mg total) by mouth daily with breakfast. 12/03/19   Mannam, Praveen, MD  triamcinolone cream (KENALOG) 0.1 % Apply 1 application topically 2 (two) times daily. 04/23/18   Hoyt Koch, MD  valACYclovir (VALTREX) 1000 MG tablet TAKE 1 TABLET TWICE A DAY 05/09/19   Hoyt Koch, MD    Allergies    Crestor [rosuvastatin], Lactose intolerance (gi), Penicillins, Imuran [azathioprine], and Niacin  Review of Systems   Review of Systems All systems reviewed and negative, other than as noted in HPI.  Physical Exam Updated Vital Signs BP 113/70   Pulse 80   Temp 98.9 F (37.2 C) (Oral)   Resp 18   Ht 5\' 4"  (1.626 m)   Wt 47.6 kg   SpO2 100%   BMI 18.01 kg/m   Physical Exam Vitals and nursing note reviewed.  Constitutional:      General: She is not in acute distress.    Appearance: She is well-developed.  HENT:     Head: Normocephalic and atraumatic.  Eyes:     General:        Right eye: No discharge.        Left eye: No discharge.     Conjunctiva/sclera: Conjunctivae normal.  Cardiovascular:     Rate and Rhythm: Normal rate and regular rhythm.     Heart sounds: Normal heart sounds. No murmur heard.  No friction rub. No gallop.   Pulmonary:     Effort: Pulmonary effort is normal. No respiratory distress.     Breath sounds: Normal breath sounds.  Abdominal:     General: There is no distension.     Palpations: Abdomen is soft.     Tenderness: There is no  abdominal tenderness.  Musculoskeletal:        General: No tenderness.     Cervical back: Neck supple.  Skin:    General: Skin is warm and dry.  Neurological:     Mental Status: She is alert.  Psychiatric:        Behavior: Behavior normal.        Thought Content: Thought content normal.     ED Results / Procedures / Treatments   Labs (all labs ordered are listed, but only abnormal results are displayed) Labs Reviewed  CBC WITH DIFFERENTIAL/PLATELET - Abnormal; Notable for the following components:      Result Value   WBC 11.6 (*)    RBC 3.22 (*)    Hemoglobin 10.0 (*)    HCT 32.3 (*)    MCV 100.3 (*)    Lymphs Abs 4.2 (*)    Monocytes Absolute 1.1 (*)    Abs Immature Granulocytes 0.10 (*)    All other components within normal limits  BASIC METABOLIC PANEL - Abnormal; Notable for the following components:   Calcium 8.8 (*)    All other components within normal limits  BRAIN NATRIURETIC PEPTIDE  TROPONIN I (HIGH SENSITIVITY)    EKG EKG Interpretation  Date/Time:  Wednesday February 12 2020 17:34:18 EDT Ventricular Rate:  89 PR Interval:  164 QRS Duration: 70 QT Interval:  356 QTC Calculation: 433 R Axis:   67 Text Interpretation: Normal sinus rhythm Septal infarct , age undetermined Abnormal ECG Confirmed by Virgel Manifold [phone]) on 02/12/2020 7:44:10 PM   Radiology  DG Chest Port 1 View  Result Date: 02/12/2020 CLINICAL DATA:  Shortness of breath, worsening for 3 days, on oxygen, history of pulmonary fibrosis, CAD EXAM: PORTABLE CHEST 1 VIEW COMPARISON:  Radiograph 01/24/2020, CT 01/24/2020 FINDINGS: Diffuse coarse reticular opacities throughout the lungs compatible with patient's provided history of pulmonary fibrosis. Some mildly increased opacity is noted towards the periphery of the left lung, and within the bilateral lung infrahilar lungs which is quite nonspecific and could reflect acute infection/inflammation, edema, or progression of disease. No other focal  opacity is seen. No pneumothorax or effusion. Stable cardiomediastinal contours with a calcified aorta and prominent central pulmonary arteries which may reflect pulmonary artery hypertension in the setting of advanced parenchymal disease. No acute osseous or soft tissue abnormality. Telemetry leads and nasal cannula overlie the chest. IMPRESSION: 1. Diffuse coarse reticular opacities throughout the lungs compatible with patient's provided history of pulmonary fibrosis. 2. Mildly increased opacity towards the periphery of the left lung, and within the bilateral lung infrahilar lungs which is nonspecific. Could reflect superimposed acute infection/inflammation, edema, or progression of disease. Electronically Signed   By: Lovena Le M.D.   On: 02/12/2020 18:35    Procedures Procedures (including critical care time)  Medications Ordered in ED Medications - No data to display  ED Course  I have reviewed the triage vital signs and the nursing notes.  Pertinent labs & imaging results that were available during my care of the patient were reviewed by me and considered in my medical decision making (see chart for details).    MDM Rules/Calculators/A&P                          86 year old female with dyspnea/chest discomfort.  I doubt ACS, PE, dissection or other emergent process.  Final Clinical Impression(s) / ED Diagnoses Final diagnoses:  Shortness of breath    Rx / DC Orders ED Discharge Orders    None       Virgel Manifold, MD 02/16/20 1150

## 2020-02-12 NOTE — Telephone Encounter (Signed)
Called and spoke with Patient.  Dr Matilde Bash recommendations given.  Understanding stated.  Patient stated she would go to ED.  Nothing further at this time.

## 2020-02-12 NOTE — Telephone Encounter (Signed)
Home health nurse called and states that patient has an appointment with Dr. Vaughan Browner at 3pm today but states that patient is having increased shortness of breath she is currently on 2 liters and ranging anywhere from 88% to 91%, Swelling in her feet and had chest pain throughout the night. Patient does have CHF. Home health nurse wants to know if they should keep appointment at 3pm or if they should go to the emergency room.  Please advise

## 2020-02-14 ENCOUNTER — Telehealth: Payer: Self-pay | Admitting: Internal Medicine

## 2020-02-14 ENCOUNTER — Telehealth: Payer: Self-pay | Admitting: Pulmonary Disease

## 2020-02-14 NOTE — Telephone Encounter (Signed)
verbals given via VM

## 2020-02-14 NOTE — Telephone Encounter (Signed)
    Mickel Baas from [facility] calling for verbal orders for skilled nursing eval for education Call [phone], ok to leave message

## 2020-02-14 NOTE — Telephone Encounter (Signed)
Spoke with pt. We sent her to ED on 02/12/2020 (see telephone encounter). Pt is wanting to know if she needs an OV at this time. Advised her to keep her upcoming appointment with Dr. Vaughan Browner on 03/10/2020 and to call us if she needed anything in the meantime. She verbalized understanding. Nothing further was needed.

## 2020-02-14 NOTE — Telephone Encounter (Signed)
Fine

## 2020-02-17 DIAGNOSIS — J9611 Chronic respiratory failure with hypoxia: Secondary | ICD-10-CM | POA: Diagnosis not present

## 2020-02-17 DIAGNOSIS — D509 Iron deficiency anemia, unspecified: Secondary | ICD-10-CM | POA: Diagnosis not present

## 2020-02-17 DIAGNOSIS — I2510 Atherosclerotic heart disease of native coronary artery without angina pectoris: Secondary | ICD-10-CM | POA: Diagnosis not present

## 2020-02-17 DIAGNOSIS — J8417 Interstitial lung disease with progressive fibrotic phenotype in diseases classified elsewhere: Secondary | ICD-10-CM | POA: Diagnosis not present

## 2020-02-17 DIAGNOSIS — M069 Rheumatoid arthritis, unspecified: Secondary | ICD-10-CM | POA: Diagnosis not present

## 2020-02-17 DIAGNOSIS — M25512 Pain in left shoulder: Secondary | ICD-10-CM | POA: Diagnosis not present

## 2020-02-18 DIAGNOSIS — M0579 Rheumatoid arthritis with rheumatoid factor of multiple sites without organ or systems involvement: Secondary | ICD-10-CM | POA: Diagnosis not present

## 2020-02-20 ENCOUNTER — Other Ambulatory Visit: Payer: Self-pay | Admitting: *Deleted

## 2020-02-20 DIAGNOSIS — M069 Rheumatoid arthritis, unspecified: Secondary | ICD-10-CM | POA: Diagnosis not present

## 2020-02-20 DIAGNOSIS — I2510 Atherosclerotic heart disease of native coronary artery without angina pectoris: Secondary | ICD-10-CM | POA: Diagnosis not present

## 2020-02-20 DIAGNOSIS — M25512 Pain in left shoulder: Secondary | ICD-10-CM | POA: Diagnosis not present

## 2020-02-20 DIAGNOSIS — D509 Iron deficiency anemia, unspecified: Secondary | ICD-10-CM | POA: Diagnosis not present

## 2020-02-20 DIAGNOSIS — J9611 Chronic respiratory failure with hypoxia: Secondary | ICD-10-CM | POA: Diagnosis not present

## 2020-02-20 DIAGNOSIS — J8417 Interstitial lung disease with progressive fibrotic phenotype in diseases classified elsewhere: Secondary | ICD-10-CM | POA: Diagnosis not present

## 2020-02-20 NOTE — Patient Outreach (Signed)
[facility] Care Management  02/20/2020  [name] [name] [birth year] [id number]   Call placed to member to follow up on ongoing recovery from hospital stay and recent ED visit (8/4 for shortness of breath).  State this is not a good time to talk as she is getting dressed for the therapist to come in for home session. Report she will call this care manager back once she is done.  Will await call back, if no call back will follow up within the next 3-4 business days.  Valente David, [place], MSN [place] [phone]

## 2020-02-21 DIAGNOSIS — J8417 Interstitial lung disease with progressive fibrotic phenotype in diseases classified elsewhere: Secondary | ICD-10-CM | POA: Diagnosis not present

## 2020-02-21 DIAGNOSIS — J9611 Chronic respiratory failure with hypoxia: Secondary | ICD-10-CM | POA: Diagnosis not present

## 2020-02-21 DIAGNOSIS — M069 Rheumatoid arthritis, unspecified: Secondary | ICD-10-CM | POA: Diagnosis not present

## 2020-02-21 DIAGNOSIS — I2510 Atherosclerotic heart disease of native coronary artery without angina pectoris: Secondary | ICD-10-CM | POA: Diagnosis not present

## 2020-02-21 DIAGNOSIS — D509 Iron deficiency anemia, unspecified: Secondary | ICD-10-CM | POA: Diagnosis not present

## 2020-02-21 DIAGNOSIS — M25512 Pain in left shoulder: Secondary | ICD-10-CM | POA: Diagnosis not present

## 2020-02-25 ENCOUNTER — Telehealth: Payer: Self-pay | Admitting: Internal Medicine

## 2020-02-25 ENCOUNTER — Other Ambulatory Visit: Payer: Self-pay | Admitting: *Deleted

## 2020-02-25 NOTE — Patient Outreach (Signed)
[facility]) Care Management  02/25/2020  SHAMYAH STANTZ 1931/05/25 [id number]   Outreach attempt #2, successful.  Call placed to member to follow up on ongoing recovery from hospitalization and recent ED visit on 8/4.  State she is doing better, was scheduled to see pulmonologist on 8/4 but went to the ED instead with shortness of breath.  Denies any difficulty breathing today, office visit rescheduled for 8/31.  She is supposed to have visit from PT today however she has decided to pause services due to the rise in Covid cases.   State she is skeptical having visitors coming in her home that have been into other homes.  She is hoping to her a 3rd dose of the vaccination soon, after which she would like to continue services.  She will call her PCP office to get advice on when to get the 3rd shot.  Call was placed to [facility] to provide update on member's decision.  She denies any urgent concerns at this time, encouraged to contact this care manager with questions.  Will follow up with member within the next month.  Goals Addressed              This Visit's Progress   .  Decrease falls and increase activity (pt-stated)   On track     [place] (see longitudinal plan of care for additional care plan information)  Current Barriers:  [name] Knowledge Deficits related to fall precautions . Decreased adherence to prescribed treatment for fall prevention . Knowledge Deficits related to use of DME  Clinical Goal(s):  [name] Over the next 31 days, patient will demonstrate improved adherence to prescribed treatment plan for decreasing falls as evidenced by patient reporting and review of EMR . Over the next 28days, patient will verbalize using fall risk reduction strategies discussed . Over the next 28 days, patient will not experience additional falls   Interventions:  . Provided written and verbal education re: Potential causes of falls and Fall prevention  strategies . Reviewed medications and discussed potential side effects of medications such as dizziness and frequent urination . Assessed for s/s of orthostatic hypotension . Assessed for falls since last encounter. . Assessed patients knowledge of fall risk prevention secondary to previously provided education. . Assessed working status of life alert bracelet and patient adherence . Provided patient information for fall alert systems . Evaluation of current treatment plan related to decreasing falls and patient's adherence to plan as established by provider. . Discussed plans with patient for ongoing care management follow up and provided patient with direct contact information for care management team . Reviewed scheduled/upcoming provider appointments including:   Patient Self Care Activities:  . Utilize cane/walker (assistive device) appropriately with all ambulation . De-clutter walkways . Change positions slowly . Wear secure fitting shoes at all times with ambulation . Utilize home lighting for dim lit areas . Have self and pet awareness at all times  Plan: . CCM RN CM will follow up in 2 weeks   Initial goal documentation       Valente David, RN, MSN [place] [phone]

## 2020-02-25 NOTE — Telephone Encounter (Signed)
Error no note needed °

## 2020-03-01 DIAGNOSIS — J9611 Chronic respiratory failure with hypoxia: Secondary | ICD-10-CM | POA: Diagnosis not present

## 2020-03-01 DIAGNOSIS — Z86718 Personal history of other venous thrombosis and embolism: Secondary | ICD-10-CM | POA: Diagnosis not present

## 2020-03-01 DIAGNOSIS — M25512 Pain in left shoulder: Secondary | ICD-10-CM | POA: Diagnosis not present

## 2020-03-01 DIAGNOSIS — Z9981 Dependence on supplemental oxygen: Secondary | ICD-10-CM | POA: Diagnosis not present

## 2020-03-01 DIAGNOSIS — G4700 Insomnia, unspecified: Secondary | ICD-10-CM | POA: Diagnosis not present

## 2020-03-01 DIAGNOSIS — D509 Iron deficiency anemia, unspecified: Secondary | ICD-10-CM | POA: Diagnosis not present

## 2020-03-01 DIAGNOSIS — E539 Vitamin B deficiency, unspecified: Secondary | ICD-10-CM | POA: Diagnosis not present

## 2020-03-01 DIAGNOSIS — M069 Rheumatoid arthritis, unspecified: Secondary | ICD-10-CM | POA: Diagnosis not present

## 2020-03-01 DIAGNOSIS — E559 Vitamin D deficiency, unspecified: Secondary | ICD-10-CM | POA: Diagnosis not present

## 2020-03-01 DIAGNOSIS — E785 Hyperlipidemia, unspecified: Secondary | ICD-10-CM | POA: Diagnosis not present

## 2020-03-01 DIAGNOSIS — I2510 Atherosclerotic heart disease of native coronary artery without angina pectoris: Secondary | ICD-10-CM | POA: Diagnosis not present

## 2020-03-01 DIAGNOSIS — J8417 Interstitial lung disease with progressive fibrotic phenotype in diseases classified elsewhere: Secondary | ICD-10-CM | POA: Diagnosis not present

## 2020-03-02 ENCOUNTER — Telehealth: Payer: Self-pay | Admitting: Internal Medicine

## 2020-03-02 NOTE — Telephone Encounter (Signed)
New message:   Pt is requesting to be put on hold for one week until she gets her covid shot and will resume next week. Please advise.

## 2020-03-03 ENCOUNTER — Ambulatory Visit: Payer: Medicare Other | Attending: Internal Medicine

## 2020-03-03 DIAGNOSIS — Z23 Encounter for immunization: Secondary | ICD-10-CM

## 2020-03-03 NOTE — Progress Notes (Signed)
   Covid-19 Vaccination Clinic  Name:  [name] [name]    MRN: [id number] DOB: 16-May-1931  03/03/2020  [name] was observed post Covid-19 immunization for 15 minutes without incident. She was provided with Vaccine Information Sheet and instruction to access the V-Safe system.   [name] was instructed to call 911 with any severe reactions post vaccine: [name] Difficulty breathing  . Swelling of face and throat  . A fast heartbeat  . A bad rash all over body  . Dizziness and weakness   Immunizations Administered    Name Date Dose VIS Date Route   Moderna COVID-19 Vaccine 03/03/2020 11:01 AM 0.5 mL 06/2019 Intramuscular   Manufacturer: Moderna   Lot: [id number]   [place]: [id number]

## 2020-03-03 NOTE — Telephone Encounter (Signed)
Noted  

## 2020-03-05 ENCOUNTER — Telehealth: Payer: Self-pay | Admitting: Internal Medicine

## 2020-03-05 NOTE — Telephone Encounter (Signed)
Crystal with [facility] is calling requesting the status on orders that been faxed over.   Informed Crystal this provider is out of office and orders will be given to another provider to sign. She is requesting a call back to know who the provider will be, Because the orders will need to be redone with that providers name on it for who ever is signing.   [phone]

## 2020-03-10 ENCOUNTER — Ambulatory Visit (INDEPENDENT_AMBULATORY_CARE_PROVIDER_SITE_OTHER): Payer: Medicare Other | Admitting: Pulmonary Disease

## 2020-03-10 ENCOUNTER — Encounter: Payer: Self-pay | Admitting: Pulmonary Disease

## 2020-03-10 ENCOUNTER — Other Ambulatory Visit: Payer: Self-pay

## 2020-03-10 VITALS — BP 100/56 | HR 65 | Temp 96.5°F | Ht 64.5 in | Wt 107.4 lb

## 2020-03-10 DIAGNOSIS — J849 Interstitial pulmonary disease, unspecified: Secondary | ICD-10-CM

## 2020-03-10 DIAGNOSIS — M069 Rheumatoid arthritis, unspecified: Secondary | ICD-10-CM

## 2020-03-10 MED ORDER — OFEV 100 MG PO CAPS: 100.0000 mg | ORAL_CAPSULE | Freq: Two times a day (BID) | ORAL | 3 refills | Status: DC

## 2020-03-10 NOTE — Patient Instructions (Addendum)
Continue Lasix We will start Ofev at 100 mg twice daily Future order for comprehensive metabolic panel at 1 month and 2 months Follow-up in 3 months

## 2020-03-10 NOTE — Addendum Note (Signed)
Addended by: Vanessa Barbara on: 03/10/2020 02:30 PM   Modules accepted: Orders

## 2020-03-10 NOTE — Progress Notes (Addendum)
[name] [name]    [id number]    May 15, 1931  Primary Care Physician:Crawford, Real Cons, MD  Referring Physician: Hoyt Koch, MD [address] [place],  [place] [address]  Chief complaint: Follow-up for RA ILD  HPI: 86 year old with RA ILD, UIP fibrosis, chronic hypoxic respiratory failure on oxygen, DVT, PE Former patient of Dr. Lake [name].  Pulmonary fibrosis diagnosed in 2003.    She was previously evaluated at [facility] and [facility].  Being managed by [name] [name] for rheumatoid arthritis She was previously on infliximab, leflunomide and Imuran, which she did not tolerate due to severe GI symptoms.  Changed to CellCept in later part of 2019 Also has history of recurrent PE, DVT.  She has been difficult antiplatelet due to frequent falls.  Anticoagulation was stopped in January 2018 and she gets Lovenox to use when she does long distance travel.  Smoking history: Former smoker.  Quit in 1975.  10-pack-year smoking history  Interim history: States that her breathing is stable with no issues.  Continues on CellCept and dapsone She has started on Ofev around Hospitalized in July 2021 for dizziness, fall.  It is unclear to secondary to the Ofev however the medication was held  Complains of lower extremity edema which is unchanged  Outpatient Encounter Medications as of 03/10/2020  Medication Sig  . aspirin EC 81 MG tablet Take 1 tablet (81 mg total) by mouth daily.  [name] atorvastatin (LIPITOR) 40 MG tablet Take 1 tablet (40 mg total) by mouth daily.  . dapsone 100 MG tablet TAKE 1 TABLET(100 MG) BY MOUTH DAILY (Patient taking differently: Take 100 mg by mouth daily. )  . fluticasone (FLONASE) 50 MCG/ACT nasal spray Place 2 sprays into both nostrils daily as needed for allergies or rhinitis.  . furosemide (LASIX) 20 MG tablet Take 1 tablet by mouth daily as needed for swelling. (Patient taking differently: Take 20 mg by mouth daily as needed for fluid or  edema. )  . InFLIXimab (REMICADE IV) Inject 5 mg into the vein every 14 (fourteen) days.   [name] levalbuterol (XOPENEX HFA) 45 MCG/ACT inhaler Inhale 2 puffs into the lungs every 6 (six) hours as needed for wheezing.  . metoprolol succinate (TOPROL XL) 25 MG 24 hr tablet Take 1 tablet (25 mg total) by mouth daily.  . mirtazapine (REMERON) 15 MG tablet Take 1 tablet (15 mg total) by mouth at bedtime.  . mycophenolate (CELLCEPT) 500 MG tablet 2 tabs bid  . NONFORMULARY OR COMPOUNDED ITEM Antifungal solution: Terbinafine 3%, Fluconazole 2%, Tea Tree Oil 5%, Urea 10%, Ibuprofen 2% in DMSO suspension #65mL  . nystatin cream (MYCOSTATIN) Apply topically.  [name] [name] (SYSTANE) 0.4-0.3 % SOLN Apply 1 drop to eye daily as needed (for dry eyes).  . polyethylene [name] powder (GLYCOLAX/MIRALAX) powder Take 17 g by mouth 2 (two) times daily as needed. (Patient taking differently: Take 17 g by mouth 2 (two) times daily as needed for mild constipation. )  . predniSONE (DELTASONE) 5 MG tablet Take 1 tablet (5 mg total) by mouth daily with breakfast.  . triamcinolone cream (KENALOG) 0.1 % Apply 1 application topically 2 (two) times daily.  . valACYclovir (VALTREX) 1000 MG tablet TAKE 1 TABLET TWICE A DAY  . nitroGLYCERIN (NITROSTAT) 0.4 MG SL tablet Place 1 tablet (0.4 mg total) under the tongue every 5 (five) minutes as needed for chest pain.   No facility-administered encounter medications on file as  of 03/10/2020.   Physical Exam: Blood pressure (!) 100/56, pulse 65, temperature (!) 96.5 F (35.8 C), temperature source Temporal, height 5' 4.5" (1.638 m), weight 107 lb 6.4 oz (48.7 kg), SpO2 95 %. Gen:      No acute distress HEENT:  EOMI, sclera anicteric Neck:     No masses; no thyromegaly Lungs:    Clear to auscultation bilaterally; normal respiratory effort CV:         Regular rate and rhythm; no murmurs Abd:      + bowel sounds; soft, non-tender; no palpable masses, no  distension Ext:    No edema; adequate peripheral perfusion Skin:      Warm and dry; no rash Neuro: alert and oriented x 3 Psych: normal mood and affect  Data Reviewed: Imaging: High-res CT 10/26/2017-UIP pattern pulmonary fibrosis.  Stable compared to 2017.   High-res CT 05/30/2019-UIP pattern fibrosis.  Stable.  I have reviewed the images personally.  PFTs: 10/13/2017 FVC 1.67 [86%], FEV1 1.36 [89%], F/F 81, TLC 3.64 [70%], DLCO 6.17 [24%] Mild restriction and severe diffusion defect  07/25/2019 FVC 1.63 [85%], FEV1 1.32 [89%], F/F 81, TLC 3.74 [71%), DLCO 5.83 [30%] Mild restriction, severe diffusion  6 min walk July 2017 6 minute walk distance 324 m, O2 saturation nadir 93% on room air 09/06/2019 -62 m, nadir O2 sat of 82%  Cardiac: Echocardiogram 01/26/2020-LVEF 55-60%, normal RV size and function.  Normal PA systolic pressure.  Assessment:  RA ILD with UIP fibrosis CT scan reviewed with UIP fibrosis.   Continue CellCept. She is on dapsone for pneumocystis prophylaxis for unclear reason.  She had been on Bactrim in the past Since she is tolerating dapsone okay we will continue it for now.  Stable on CT scan dating back to 2014. But with clear progression compared to 2009. She is clearly worse in terms of symptoms, PFTs, 6-minute walk test and was started on antifibrotic's earlier this year  Ofev was briefly held due to dizziness, falls.  I am not sure if this was a reaction to the medication We will initiate at a lower dose of 100 mg twice daily  Lower extremity edema Has history of diastolic heart failure Continue Lasix Echo reviewed with no pulmonary hypertension  History of DVT, PE Not on chronic anticoagulation due to concern for fall She uses Lovenox while traveling.  Health maintenance Fully vaccinated with booster against Covid   Plan/Recommendations: - Continue CellCept, dapsone - Restart Ofev at 100mg  bid.  Monitor LFTs - Continue Lasix  [name] Garfinkel MD [facility] 03/10/2020, 2:07 PM  CC: [name] [name], *  Addendum: Received note from [name] [name], rheumatology dated 04/18/2020 Patient continues on Remicade, mycophenolate and low-dose prednisone with good control of symptoms  [name] Garfinkel MD [facility] Please see [email or web address] for pager details.  05/06/2020, 3:38 PM

## 2020-03-12 DIAGNOSIS — J9611 Chronic respiratory failure with hypoxia: Secondary | ICD-10-CM | POA: Diagnosis not present

## 2020-03-12 DIAGNOSIS — I2510 Atherosclerotic heart disease of native coronary artery without angina pectoris: Secondary | ICD-10-CM | POA: Diagnosis not present

## 2020-03-12 DIAGNOSIS — D509 Iron deficiency anemia, unspecified: Secondary | ICD-10-CM | POA: Diagnosis not present

## 2020-03-12 DIAGNOSIS — M069 Rheumatoid arthritis, unspecified: Secondary | ICD-10-CM | POA: Diagnosis not present

## 2020-03-12 DIAGNOSIS — M25512 Pain in left shoulder: Secondary | ICD-10-CM | POA: Diagnosis not present

## 2020-03-12 DIAGNOSIS — J8417 Interstitial lung disease with progressive fibrotic phenotype in diseases classified elsewhere: Secondary | ICD-10-CM | POA: Diagnosis not present

## 2020-03-13 DIAGNOSIS — D509 Iron deficiency anemia, unspecified: Secondary | ICD-10-CM | POA: Diagnosis not present

## 2020-03-13 DIAGNOSIS — J8417 Interstitial lung disease with progressive fibrotic phenotype in diseases classified elsewhere: Secondary | ICD-10-CM | POA: Diagnosis not present

## 2020-03-13 DIAGNOSIS — M069 Rheumatoid arthritis, unspecified: Secondary | ICD-10-CM | POA: Diagnosis not present

## 2020-03-13 DIAGNOSIS — J9611 Chronic respiratory failure with hypoxia: Secondary | ICD-10-CM | POA: Diagnosis not present

## 2020-03-13 DIAGNOSIS — I2510 Atherosclerotic heart disease of native coronary artery without angina pectoris: Secondary | ICD-10-CM | POA: Diagnosis not present

## 2020-03-13 DIAGNOSIS — M25512 Pain in left shoulder: Secondary | ICD-10-CM | POA: Diagnosis not present

## 2020-03-23 ENCOUNTER — Other Ambulatory Visit: Payer: Self-pay | Admitting: *Deleted

## 2020-03-23 NOTE — Patient Outreach (Signed)
[facility]) Care Management  03/23/2020  MAKALEY STORTS [birth year] [id number]   Call placed to member to follow up on recent MD visit and management of chronic medical conditions.  State this is not a good time to talk but she will call this care manager back later.  Will await call back, if no call back will follow up within the next 3-4 business days.  Valente David, [place], MSN [place] [phone]

## 2020-03-26 ENCOUNTER — Other Ambulatory Visit: Payer: Self-pay | Admitting: *Deleted

## 2020-03-26 NOTE — Patient Outreach (Signed)
[place] [facility]) Care Management  03/26/2020  [name] [name] 1931/02/28 [id number]   Outreach attempt #2, unsuccessful.    Call placed to member to follow up on recent MD visit and management of chronic medical conditions. Unsuccessful, HIPAA compliant voice message left.  Will send unsuccessful outreach letter and follow up within the next 3-4 business days.  Valente David, [place], MSN [place] [phone]

## 2020-03-27 ENCOUNTER — Other Ambulatory Visit: Payer: Self-pay | Admitting: *Deleted

## 2020-03-27 ENCOUNTER — Other Ambulatory Visit: Payer: Self-pay

## 2020-03-27 ENCOUNTER — Ambulatory Visit (INDEPENDENT_AMBULATORY_CARE_PROVIDER_SITE_OTHER): Payer: Medicare Other

## 2020-03-27 DIAGNOSIS — Z0000 Encounter for general adult medical examination without abnormal findings: Secondary | ICD-10-CM | POA: Diagnosis not present

## 2020-03-27 NOTE — Patient Instructions (Addendum)
[name] [name] , Thank you for taking time to come for your Medicare Wellness Visit. I appreciate your ongoing commitment to your health goals. Please review the following plan we discussed and let me know if I can assist you in the future.   Screening recommendations/referrals: Colonoscopy: no repeat due to age 86: no repeat due to age (Patient was advised to do self breast checks if any abnormality to call PCP office) Bone Density: 02/09/2016 Recommended yearly ophthalmology/optometry visit for glaucoma screening and checkup Recommended yearly dental visit for hygiene and checkup  Vaccinations: Influenza vaccine: 03/29/2019 Pneumococcal vaccine: completed Tdap vaccine: 12/07/2013; due every 10 years (due 2015) Shingles vaccine: never done; (patient will check with local pharmacy in early 2022)   Covid-19: completed (patient has received booster vaccine)  Advanced directives: Please bring a copy of your health care power of attorney and living will to the office at your convenience.  Conditions/risks identified: Yes; Reviewed health maintenance screenings with patient today and relevant education, vaccines, and/or referrals were provided. Please continue to do your personal lifestyle choices by: daily care of teeth and gums, regular physical activity (goal should be 5 days a week for 30 minutes), eat a healthy diet, avoid tobacco and drug use, limiting any alcohol intake, taking a low-dose aspirin (if not allergic or have been advised by your provider otherwise) and taking vitamins and minerals as recommended by your provider. Continue doing brain stimulating activities (puzzles, reading, adult coloring books, staying active) to keep memory sharp. Continue to eat heart healthy diet (full of fruits, vegetables, whole grains, lean protein, water--limit salt, fat, and sugar intake) and increase physical activity as tolerated.   Next appointment: Please schedule your next Medicare Wellness Visit with  your Nurse Health Advisor in 1 year.   Preventive Care 23 Years and Older, Female Preventive care refers to lifestyle choices and visits with your health care provider that can promote health and wellness. What does preventive care include?  A yearly physical exam. This is also called an annual well check.  Dental exams once or twice a year.  Routine eye exams. Ask your health care provider how often you should have your eyes checked.  Personal lifestyle choices, including:  Daily care of your teeth and gums.  Regular physical activity.  Eating a healthy diet.  Avoiding tobacco and drug use.  Limiting alcohol use.  Practicing safe sex.  Taking low-dose aspirin every day.  Taking vitamin and mineral supplements as recommended by your health care provider. What happens during an annual well check? The services and screenings done by your health care provider during your annual well check will depend on your age, overall health, lifestyle risk factors, and family history of disease. Counseling  Your health care provider may ask you questions about your:  Alcohol use.  Tobacco use.  Drug use.  Emotional well-being.  Home and relationship well-being.  Sexual activity.  Eating habits.  History of falls.  Memory and ability to understand (cognition).  Work and work Statistician.  Reproductive health. Screening  You may have the following tests or measurements:  Height, weight, and BMI.  Blood pressure.  Lipid and cholesterol levels. These may be checked every 5 years, or more frequently if you are over 83 years old.  Skin check.  Lung cancer screening. You may have this screening every year starting at age 66 if you have a 30-pack-year history of smoking and currently smoke or have quit within the past 15 years.  Fecal occult  blood test (FOBT) of the stool. You may have this test every year starting at age 21.  Flexible sigmoidoscopy or colonoscopy. You  may have a sigmoidoscopy every 5 years or a colonoscopy every 10 years starting at age 55.  Hepatitis C blood test.  Hepatitis B blood test.  Sexually transmitted disease (STD) testing.  Diabetes screening. This is done by checking your blood sugar (glucose) after you have not eaten for a while (fasting). You may have this done every 1-3 years.  Bone density scan. This is done to screen for osteoporosis. You may have this done starting at age 74.  Mammogram. This may be done every 1-2 years. Talk to your health care provider about how often you should have regular mammograms. Talk with your health care provider about your test results, treatment options, and if necessary, the need for more tests. Vaccines  Your health care provider may recommend certain vaccines, such as:  Influenza vaccine. This is recommended every year.  Tetanus, diphtheria, and acellular pertussis (Tdap, Td) vaccine. You may need a Td booster every 10 years.  Zoster vaccine. You may need this after age 57.  Pneumococcal 13-valent conjugate (PCV13) vaccine. One dose is recommended after age 40.  Pneumococcal polysaccharide (PPSV23) vaccine. One dose is recommended after age 86. Talk to your health care provider about which screenings and vaccines you need and how often you need them. This information is not intended to replace advice given to you by your health care provider. Make sure you discuss any questions you have with your health care provider. Document Released: 07/24/2015 Document Revised: 03/16/2016 Document Reviewed: 04/28/2015 Elsevier Interactive Patient Education  2017 [employer] Prevention in the Home Falls can cause injuries. They can happen to people of all ages. There are many things you can do to make your home safe and to help prevent falls. What can I do on the outside of my home?  Regularly fix the edges of walkways and driveways and fix any cracks.  Remove anything that might  make you trip as you walk through a door, such as a raised step or threshold.  Trim any bushes or trees on the path to your home.  Use bright outdoor lighting.  Clear any walking paths of anything that might make someone trip, such as rocks or tools.  Regularly check to see if handrails are loose or broken. Make sure that both sides of any steps have handrails.  Any raised decks and porches should have guardrails on the edges.  Have any leaves, snow, or ice cleared regularly.  Use sand or salt on walking paths during winter.  Clean up any spills in your garage right away. This includes oil or grease spills. What can I do in the bathroom?  Use night lights.  Install grab bars by the toilet and in the tub and shower. Do not use towel bars as grab bars.  Use non-skid mats or decals in the tub or shower.  If you need to sit down in the shower, use a plastic, non-slip stool.  Keep the floor dry. Clean up any water that spills on the floor as soon as it happens.  Remove soap buildup in the tub or shower regularly.  Attach bath mats securely with double-sided non-slip rug tape.  Do not have throw rugs and other things on the floor that can make you trip. What can I do in the bedroom?  Use night lights.  Make sure that you have  a light by your bed that is easy to reach.  Do not use any sheets or blankets that are too big for your bed. They should not hang down onto the floor.  Have a firm chair that has side arms. You can use this for support while you get dressed.  Do not have throw rugs and other things on the floor that can make you trip. What can I do in the kitchen?  Clean up any spills right away.  Avoid walking on wet floors.  Keep items that you use a lot in easy-to-reach places.  If you need to reach something above you, use a strong step stool that has a grab bar.  Keep electrical cords out of the way.  Do not use floor polish or wax that makes floors  slippery. If you must use wax, use non-skid floor wax.  Do not have throw rugs and other things on the floor that can make you trip. What can I do with my stairs?  Do not leave any items on the stairs.  Make sure that there are handrails on both sides of the stairs and use them. Fix handrails that are broken or loose. Make sure that handrails are as long as the stairways.  Check any carpeting to make sure that it is firmly attached to the stairs. Fix any carpet that is loose or worn.  Avoid having throw rugs at the top or bottom of the stairs. If you do have throw rugs, attach them to the floor with carpet tape.  Make sure that you have a light switch at the top of the stairs and the bottom of the stairs. If you do not have them, ask someone to add them for you. What else can I do to help prevent falls?  Wear shoes that:  Do not have high heels.  Have rubber bottoms.  Are comfortable and fit you well.  Are closed at the toe. Do not wear sandals.  If you use a stepladder:  Make sure that it is fully opened. Do not climb a closed stepladder.  Make sure that both sides of the stepladder are locked into place.  Ask someone to hold it for you, if possible.  Clearly mark and make sure that you can see:  Any grab bars or handrails.  First and last steps.  Where the edge of each step is.  Use tools that help you move around (mobility aids) if they are needed. These include:  Canes.  Walkers.  Scooters.  Crutches.  Turn on the lights when you go into a dark area. Replace any light bulbs as soon as they burn out.  Set up your furniture so you have a clear path. Avoid moving your furniture around.  If any of your floors are uneven, fix them.  If there are any pets around you, be aware of where they are.  Review your medicines with your doctor. Some medicines can make you feel dizzy. This can increase your chance of falling. Ask your doctor what other things that you  can do to help prevent falls. This information is not intended to replace advice given to you by your health care provider. Make sure you discuss any questions you have with your health care provider. Document Released: 04/23/2009 Document Revised: 12/03/2015 Document Reviewed: 08/01/2014 Elsevier Interactive Patient Education  2017 [employer].

## 2020-03-27 NOTE — Patient Outreach (Signed)
[facility] [facility]) Care Management  03/27/2020  [name] [name] 1930-09-15 [id number]   Voice message received back from member after unsuccessful outreach attempts.  Call placed to member, identity verified.  She state she is doing well, has been able to become more active inside the home.  She is still hesitant about activities outside of the home due to the pandemic.  She had restarted her home health sessions after having her 3rd Covid vaccination but has again stopped the visits due to concern for exposure.  Although she has stopped the home health team from coming in the home, she does still have assistance in the home through house keepers.  She was seen by Dr. Ardis Hughs on 8/31, Ofev does was adjusted, otherwise no changes in plan of care.  She still has intermittent shortness of breath, relieved with home O2.  Denies any urgent concerns at this time, encouraged to contact this care manager with questions.  Will follow up within the next month, if remain stable will consider transition to health coach.  Goals Addressed              This Visit's Progress   .  COMPLETED: Decrease falls and increase activity (pt-stated)        CARE PLAN ENTRY (see longitudinal plan of care for additional care plan information)  Current Barriers:  [name] Knowledge Deficits related to fall precautions . Decreased adherence to prescribed treatment for fall prevention . Knowledge Deficits related to use of DME  Clinical Goal(s):  [name] Over the next 31 days, patient will demonstrate improved adherence to prescribed treatment plan for decreasing falls as evidenced by patient reporting and review of EMR . Over the next 28days, patient will verbalize using fall risk reduction strategies discussed . Over the next 28 days, patient will not experience additional falls   Interventions:  . Provided written and verbal education re: Potential causes of falls and Fall prevention strategies . Reviewed medications  and discussed potential side effects of medications such as dizziness and frequent urination . Assessed for s/s of orthostatic hypotension . Assessed for falls since last encounter. . Assessed patients knowledge of fall risk prevention secondary to previously provided education. . Assessed working status of life alert bracelet and patient adherence . Provided patient information for fall alert systems . Evaluation of current treatment plan related to decreasing falls and patient's adherence to plan as established by provider. . Discussed plans with patient for ongoing care management follow up and provided patient with direct contact information for care management team . Reviewed scheduled/upcoming provider appointments including:   Patient Self Care Activities:  . Utilize cane/walker (assistive device) appropriately with all ambulation . De-clutter walkways . Change positions slowly . Wear secure fitting shoes at all times with ambulation . Utilize home lighting for dim lit areas . Have self and pet awareness at all times  Plan: . CCM RN CM will follow up in 2 weeks         Valente David, Therapist, sports, MSN [place] Manager [phone]

## 2020-03-27 NOTE — Progress Notes (Signed)
I connected with [name] [name]. Coupe today by telephone and verified that I am speaking with the correct person using two identifiers. Location patient: home Location provider: work Persons participating in the virtual visit: [name] [name]. [name] [employer], LPN.   I discussed the limitations, risks, security and privacy concerns of performing an evaluation and management service by telephone and the availability of in person appointments. I also discussed with the patient that there may be a patient responsible charge related to this service. The patient expressed understanding and verbally consented to this telephonic visit.    Interactive audio and video telecommunications were attempted between this provider and patient, however failed, due to patient having technical difficulties OR patient did not have access to video capability.  We continued and completed visit with audio only.  Some vital signs may be absent or patient reported.   Time Spent with patient on telephone encounter: 30 minutes  Subjective:   [name] [name] is a 86 y.o. female who presents for Medicare Annual (Subsequent) preventive examination.  Review of Systems    No ROS. Medicare Wellness Visit. Cardiac Risk Factors include: advanced age (>35men, >25 women);dyslipidemia;family history of premature cardiovascular disease     Objective:    There were no vitals filed for this visit. There is no height or weight on file to calculate BMI.  Advanced Directives 03/27/2020 02/25/2020 02/12/2020 01/25/2020 01/24/2020 01/24/2020 12/28/2019  Does Patient Have a Medical Advance Directive? Yes Yes Yes Yes No;Yes Yes Yes  Type of Paramedic of [place];Living will Healthcare Power of [place] of [place];Living will - [name];Living will [place];Living will  Does patient want to make changes to medical advance directive? No  - Patient declined No - Patient declined - No - Patient declined - No - Patient declined -  Copy of [name] in Chart? No - copy requested - - No - copy requested - - -  Would patient like information on creating a medical advance directive? - - - - - - -    Current Medications (verified) Outpatient Encounter Medications as of 03/27/2020  Medication Sig  . aspirin EC 81 MG tablet Take 1 tablet (81 mg total) by mouth daily.  [name] atorvastatin (LIPITOR) 40 MG tablet Take 1 tablet (40 mg total) by mouth daily.  . dapsone 100 MG tablet TAKE 1 TABLET(100 MG) BY MOUTH DAILY (Patient taking differently: Take 100 mg by mouth daily. )  . fluticasone (FLONASE) 50 MCG/ACT nasal spray Place 2 sprays into both nostrils daily as needed for allergies or rhinitis.  . furosemide (LASIX) 20 MG tablet Take 1 tablet by mouth daily as needed for swelling. (Patient taking differently: Take 20 mg by mouth daily as needed for fluid or edema. )  . InFLIXimab (REMICADE IV) Inject 5 mg into the vein every 14 (fourteen) days.   [name] levalbuterol (XOPENEX HFA) 45 MCG/ACT inhaler Inhale 2 puffs into the lungs every 6 (six) hours as needed for wheezing.  . metoprolol succinate (TOPROL XL) 25 MG 24 hr tablet Take 1 tablet (25 mg total) by mouth daily.  . mirtazapine (REMERON) 15 MG tablet Take 1 tablet (15 mg total) by mouth at bedtime.  . mycophenolate (CELLCEPT) 500 MG tablet 2 tabs bid  . Nintedanib (OFEV) 100 MG CAPS Take 1 capsule (100 mg total) by mouth 2 (two) times daily.  . nitroGLYCERIN (NITROSTAT) 0.4 MG SL tablet Place  1 tablet (0.4 mg total) under the tongue every 5 (five) minutes as needed for chest pain.  . NONFORMULARY OR COMPOUNDED ITEM Antifungal solution: Terbinafine 3%, Fluconazole 2%, Tea Tree Oil 5%, Urea 10%, Ibuprofen 2% in DMSO suspension #53mL  . nystatin cream (MYCOSTATIN) Apply topically.  [name] Glycol-Propyl Glycol (SYSTANE) 0.4-0.3 % SOLN Apply 1 drop to eye daily as needed  (for dry eyes).  . polyethylene glycol powder (GLYCOLAX/MIRALAX) powder Take 17 g by mouth 2 (two) times daily as needed. (Patient taking differently: Take 17 g by mouth 2 (two) times daily as needed for mild constipation. )  . predniSONE (DELTASONE) 5 MG tablet Take 1 tablet (5 mg total) by mouth daily with breakfast.  . triamcinolone cream (KENALOG) 0.1 % Apply 1 application topically 2 (two) times daily.  . valACYclovir (VALTREX) 1000 MG tablet TAKE 1 TABLET TWICE A DAY   No facility-administered encounter medications on file as of 03/27/2020.    Allergies (verified) Crestor [rosuvastatin], Lactose intolerance (gi), Penicillins, and Imuran [azathioprine]   History: Past Medical History:  Diagnosis Date  . Allergic rhinitis   . Allergy   . CAD (coronary artery disease)    Mild CAD by cath 2008  . DJD (degenerative joint disease)    rheumatoid  . DVT (deep venous thrombosis) ([place])   . Dyslipidemia   . History of echocardiogram    Echo 12/2018: EF 60-65, mod asymmetric LVH, Gr 1 DD  . History of nuclear stress test    Myoview 5/17:  EF 74%, normal perfusion, low risk // Myoview 12/2018:  EF 89, very mild ischemia in inferoapical wall; Low Risk  . History of pulmonary embolism   . Hyperlipidemia   . Insomnia   . Osteoporosis   . Psoriasis   . Pulmonary fibrosis ([place])   . Rheumatoid arthritis [facility])    Past Surgical History:  Procedure Laterality Date  . ABDOMINAL HYSTERECTOMY  1974  . APPENDECTOMY  1959  . CATARACT EXTRACTION    . LEFT HEART CATHETERIZATION WITH CORONARY ANGIOGRAM N/A 06/18/2013   Procedure: LEFT HEART CATHETERIZATION WITH CORONARY ANGIOGRAM;  Surgeon: Peter M Martinique, MD;  Location[facility] C[facility] LAB;  Service: Cardiovascular;  Laterality: N/A;  . TONSILLECTOMY  1941, 1951  . TOTAL KNEE ARTHROPLASTY  1997, 2007   Bilateral   Family History  Problem Relation Age of Onset  . Kidney disease Daughter 5  . Cancer Daughter   . Heart attack Father 19  . Alzheimer's  disease Sister   . Heart disease Sister   . Alzheimer's disease Sister    Social History   Socioeconomic History  . Marital status: Widowed    Spouse name: Not on file  . Number of children: 2  . Years of education: Not on file  . Highest education level: Not on file  Occupational History  . Occupation: Publishing rights manager    Comment: retired  Tobacco Use  . Smoking status: Former Smoker    Packs/day: 0.50    Years: 20.00    Pack years: 10.00    Types: Cigarettes    Quit date: 07/11/1973    Years since quitting: 46.7  . Smokeless tobacco: Never Used  Vaping Use  . Vaping Use: Never used  Substance and Sexual Activity  . Alcohol use: No  . Drug use: No  . Sexual activity: Never  Other Topics Concern  . Not on file  Social History Narrative   Diet:   Do you drink/eat things with caffeine?  Yes   Marital status:        Widowed                      What year were you married?1954   Do you live in a house, apartment, assisted living, condo, trailer, etc)? House   Is it one or more stories? 1 1/4   How many persons live in your home?1   Do you have any pets in your home? No   Current or past profession:  Publishing rights manager   Do you exercise?        Yes                                            Type & how often:  Walk                                    Do you have a living will? Yes   Do you have a DNR Form? Yes   Do you have a POA/HPOA forms?  Yes   Social Determinants of Health   Financial Resource Strain:   . Difficulty of Paying Living Expenses: Not on file  Food Insecurity: No Food Insecurity  . Worried About Charity fundraiser in the Last Year: Never true  . Ran Out of Food in the Last Year: Never true  Transportation Needs: No Transportation Needs  . Lack of Transportation (Medical): No  . Lack of Transportation (Non-Medical): No  Physical Activity:   . Days of Exercise per Week: Not on file  . Minutes of Exercise per Session: Not on file   Stress:   . Feeling of Stress : Not on file  Social Connections: Moderately Integrated  . Frequency of Communication with Friends and Family: More than three times a week  . Frequency of Social Gatherings with Friends and Family: More than three times a week  . Attends Religious Services: 1 to 4 times per year  . Active Member of Clubs or Organizations: Yes  . Attends Archivist Meetings: Never  . Marital Status: Widowed    Tobacco Counseling Counseling given: Not Answered   Clinical Intake:  Pre-visit preparation completed: Yes  Pain : No/denies pain     Nutritional Risks: None Diabetes: No  How often do you need to have someone help you when you read instructions, pamphlets, or other written materials from your doctor or pharmacy?: 1 - Never What is the last grade level you completed in school?: [place]  Diabetic? no  Interpreter Needed?: No  Information entered by :: Lisette Abu, LPN   Activities of Daily Living In your present state of health, do you have any difficulty performing the following activities: 03/27/2020 01/25/2020  Hearing? N Y  Vision? N N  Difficulty concentrating or making decisions? N N  Walking or climbing stairs? N Y  Dressing or bathing? N N  Doing errands, shopping? N N  Preparing Food and eating ? N -  Using the Toilet? N -  In the past six months, have you accidently leaked urine? N -  Do you have problems with loss of bowel control? Y -  Managing your Medications? N -  Managing your Finances? N -  Housekeeping or managing your Housekeeping? N -  Some recent data might be hidden    Patient Care Team: Hoyt Koch, MD as PCP - General (Internal Medicine) Burnell Blanks, MD as PCP - Cardiology (Cardiology) Burnell Blanks, MD as Consulting Physician (Cardiology) Marygrace Drought, MD as Consulting Physician (Ophthalmology) Juanito Doom, MD as Consulting Physician (Pulmonary  Disease) Gavin Pound, MD as Consulting Physician (Rheumatology) Valente David, RN as [place] any recent [place] you may have received from other than [facility] providers in the past year (date may be approximate).     Assessment:   This is a routine wellness examination for [name] [name].  Hearing/Vision screen No exam data present  Dietary issues and exercise activities discussed: Current Exercise Habits: The patient does not participate in regular exercise at present, Exercise limited by: respiratory conditions(s)  Goals    .  Decrease falls and increase activity (pt-stated)      CARE PLAN ENTRY (see longitudinal plan of care for additional care plan information)  Current Barriers:  [name] Knowledge Deficits related to fall precautions . Decreased adherence to prescribed treatment for fall prevention . Knowledge Deficits related to use of DME  Clinical Goal(s):  [name] Over the next 31 days, patient will demonstrate improved adherence to prescribed treatment plan for decreasing falls as evidenced by patient reporting and review of EMR . Over the next 28days, patient will verbalize using fall risk reduction strategies discussed . Over the next 28 days, patient will not experience additional falls   Interventions:  . Provided written and verbal education re: Potential causes of falls and Fall prevention strategies . Reviewed medications and discussed potential side effects of medications such as dizziness and frequent urination . Assessed for s/s of orthostatic hypotension . Assessed for falls since last encounter. . Assessed patients knowledge of fall risk prevention secondary to previously provided education. . Assessed working status of life alert bracelet and patient adherence . Provided patient information for fall alert systems . Evaluation of current treatment plan related to decreasing falls and patient's adherence to plan as established  by provider. . Discussed plans with patient for ongoing care management follow up and provided patient with direct contact information for care management team . Reviewed scheduled/upcoming provider appointments including:   Patient Self Care Activities:  . Utilize cane/walker (assistive device) appropriately with all ambulation . De-clutter walkways . Change positions slowly . Wear secure fitting shoes at all times with ambulation . Utilize home lighting for dim lit areas . Have self and pet awareness at all times  Plan: . CCM RN CM will follow up in 2 weeks   Initial goal documentation     .  Exercise 3x per week (30 min per time)      Increase physical activity.     .  Patient Stated      Increase the amount water intake daily. Place 2 bottles of water out daily to remind me to drink. Increase physical activity by using my exercise equipment x3 weekly 10-20 minutes and/or exercise while I watch the news. Go to bed earlier, I will cut the news off by 12:00    .  Patient Stated      I want to increase my physical activity by doing chair exercises.      Depression Screen PHQ 2/9 Scores 03/27/2020 02/06/2020 01/03/2020 07/31/2018 08/11/2017 07/21/2016 06/01/2016  PHQ - 2 Score 0 0 0 0 0 0 0  PHQ- 9 Score - - - 3 - - -  Fall Risk Fall Risk  03/27/2020 01/03/2020 07/31/2018 08/11/2017 07/21/2016  Falls in the past year? 1 0 1 No No  Comment - - - - -  Number falls in past yr: 1 0 0 - -  Injury with Fall? 1 1 1  - -  Risk for fall due to : History of fall(s);Impaired balance/gait;Medication side effect - Impaired balance/gait;Impaired mobility - -  Follow up Falls evaluation completed - Falls prevention discussed;Education provided - -    Any stairs in or around the home? Yes  If so, are there any without handrails? No  Home free of loose throw rugs in walkways, pet beds, electrical cords, etc? Yes  Adequate lighting in your home to reduce risk of falls? Yes   ASSISTIVE DEVICES  UTILIZED TO PREVENT FALLS:  Life alert? Yes  Use of a cane, walker or w/c? Yes  Grab bars in the bathroom? Yes  Shower chair or bench in shower? Yes  Elevated toilet seat or a handicapped toilet? Yes   TIMED UP AND GO:  Was the test performed? No .  Length of time to ambulate 10 feet: 0 sec.   Gait steady and fast without use of assistive device  Cognitive Function: MMSE - Mini Mental State Exam 07/31/2018 07/26/2017  Orientation to time 5 5  Orientation to Place 5 5  Registration 3 3  Attention/ Calculation 5 5  Recall 0 1  Language- name 2 objects 2 2  Language- repeat 1 1  Language- follow 3 step command 3 3  Language- read & follow direction 1 1  Write a sentence 1 1  Copy design 1 1  Total score 27 28        Immunizations Immunization History  Administered Date(s) Administered  . Influenza Split 05/09/2011  . Influenza Whole 04/10/2010, 04/10/2012  . Influenza, High Dose Seasonal PF 05/07/2017, 04/23/2018, 03/24/2019  . Influenza,inj,Quad PF,6+ Mos 07/25/2013, 04/29/2014, 04/16/2015  . Influenza-Unspecified 04/24/2016  . Moderna SARS-COVID-2 Vaccination 07/22/2019, 08/19/2019, 03/03/2020  . Pneumococcal Conjugate-13 04/16/2015  . Pneumococcal Polysaccharide-23 07/26/2017  . Tdap 11/12/2013  . Zoster 07/11/2013    TDAP status: Up to date Flu Vaccine status: Up to date Pneumococcal vaccine status: Up to date Covid-19 vaccine status: Completed vaccines  Qualifies for Shingles Vaccine? Yes   Zostavax completed Yes   Shingrix Completed?: No.    Education has been provided regarding the importance of this vaccine. Patient has been advised to call insurance company to determine out of pocket expense if they have not yet received this vaccine. Advised may also receive vaccine at local pharmacy or Health Dept. Verbalized acceptance and understanding.  Screening Tests Health Maintenance  Topic Date Due  . INFLUENZA VACCINE  02/09/2020  . TETANUS/TDAP  11/13/2023    . DEXA SCAN  Completed  . COVID-19 Vaccine  Completed  . PNA vac Low Risk Adult  Completed    Health Maintenance  Health Maintenance Due  Topic Date Due  . INFLUENZA VACCINE  02/09/2020    Colorectal cancer screening: No longer required.  Mammogram status: No longer required.  Bone Density status: Completed 02/09/2016. Results reflect: Bone density results: NORMAL. Repeat every 2-5 years.  Lung Cancer Screening: (Low Dose CT Chest recommended if Age 52-80 years, 30 pack-year currently smoking OR have quit w/in 15years.) does not qualify.   Lung Cancer Screening Referral: no  Additional Screening:  Hepatitis C Screening: does not qualify; Completed: no  Vision Screening: Recommended annual ophthalmology exams for early detection of glaucoma  and other disorders of the eye. Is the patient up to date with their annual eye exam?  Yes  Who is the provider or what is the name of the office in which the patient attends annual eye exams? Marygrace Drought, MD If pt is not established with a provider, would they like to be referred to a provider to establish care? No .   Dental Screening: Recommended annual dental exams for proper oral hygiene  Community Resource Referral / Chronic Care Management: CRR required this visit?  No   CCM required this visit?  No      Plan:     I have personally reviewed and noted the following in the patient's chart:   . Medical and social history . Use of alcohol, tobacco or illicit drugs  . Current medications and supplements . Functional ability and status . Nutritional status . Physical activity . Advanced directives . List of other physicians . Hospitalizations, surgeries, and ER visits in previous 12 months . Vitals . Screenings to include cognitive, depression, and falls . Referrals and appointments  In addition, I have reviewed and discussed with patient certain preventive protocols, quality metrics, and best practice recommendations. A  written personalized care plan for preventive services as well as general preventive health recommendations were provided to patient.     Sheral Flow, LPN   07/25/7260   Nurse Notes:  Patient is cogitatively intact. There were no vitals filed for this visit. There is no height or weight on file to calculate BMI. Patient stated that she has no issues with gait or balance; does not use any assistive devices.

## 2020-03-30 ENCOUNTER — Telehealth: Payer: Self-pay | Admitting: Pulmonary Disease

## 2020-03-30 NOTE — Telephone Encounter (Signed)
Spoke with the pt  She states has multiple questions regarding her diagnosis and prognosis  She is confused bc has been told has different dx by different doctors  She also wants to discuss alternate medications  I advised needs ov to discuss all of this and scheduled her for appt 04/02/20  Nothing further needed

## 2020-04-02 ENCOUNTER — Ambulatory Visit: Payer: Medicare Other | Admitting: Pulmonary Disease

## 2020-04-03 ENCOUNTER — Encounter: Payer: Self-pay | Admitting: Podiatry

## 2020-04-03 ENCOUNTER — Other Ambulatory Visit: Payer: Self-pay

## 2020-04-03 ENCOUNTER — Ambulatory Visit (INDEPENDENT_AMBULATORY_CARE_PROVIDER_SITE_OTHER): Payer: Medicare Other | Admitting: Podiatry

## 2020-04-03 DIAGNOSIS — M79674 Pain in right toe(s): Secondary | ICD-10-CM

## 2020-04-03 DIAGNOSIS — B351 Tinea unguium: Secondary | ICD-10-CM | POA: Diagnosis not present

## 2020-04-03 DIAGNOSIS — M79671 Pain in right foot: Secondary | ICD-10-CM

## 2020-04-03 DIAGNOSIS — L84 Corns and callosities: Secondary | ICD-10-CM | POA: Diagnosis not present

## 2020-04-03 DIAGNOSIS — M79672 Pain in left foot: Secondary | ICD-10-CM | POA: Diagnosis not present

## 2020-04-03 DIAGNOSIS — M79675 Pain in left toe(s): Secondary | ICD-10-CM | POA: Diagnosis not present

## 2020-04-03 NOTE — Patient Instructions (Signed)
Discontinue use of toenail solution.  Apply Neosporin with Pain Relief to toes once daily for 2 weeks.

## 2020-04-07 NOTE — Telephone Encounter (Signed)
Follow up message  [name] [name] following up on orders. Re-faxing to main fax number

## 2020-04-08 ENCOUNTER — Telehealth: Payer: Self-pay | Admitting: Internal Medicine

## 2020-04-08 NOTE — Progress Notes (Signed)
Subjective:  Patient ID: [name] [name], female    DOB: 01/05/1931,  MRN: [id number]  [name] [name] presents to clinic today for painful callus(es) b/l feet and painful thick toenails that are difficult to trim. Pain interferes with ambulation. Aggravating factors include wearing enclosed shoe gear. Pain is relieved with periodic professional debridement.  She is accompanied by a friend on today's visit.  She is concerned about her left hallux nail. States she was trying to cut it and she noticed a hole in the nailplate. She wants to make sure there is no sore or infection in the digit.  Review of Systems: Negative except as noted in the HPI. Past Medical History:  Diagnosis Date  . Allergic rhinitis   . Allergy   . CAD (coronary artery disease)    Mild CAD by cath 2008  . DJD (degenerative joint disease)    rheumatoid  . DVT (deep venous thrombosis) ([place])   . Dyslipidemia   . History of echocardiogram    Echo 12/2018: EF 60-65, mod asymmetric LVH, Gr 1 DD  . History of nuclear stress test    Myoview 5/17:  EF 74%, normal perfusion, low risk // Myoview 12/2018:  EF 89, very mild ischemia in inferoapical wall; Low Risk  . History of pulmonary embolism   . Hyperlipidemia   . Insomnia   . Osteoporosis   . Psoriasis   . Pulmonary fibrosis ([place])   . Rheumatoid arthritis [facility])    Past Surgical History:  Procedure Laterality Date  . ABDOMINAL HYSTERECTOMY  1974  . APPENDECTOMY  1959  . CATARACT EXTRACTION    . LEFT HEART CATHETERIZATION WITH CORONARY ANGIOGRAM N/A 06/18/2013   Procedure: LEFT HEART CATHETERIZATION WITH CORONARY ANGIOGRAM;  Surgeon: Peter M Martinique, MD;  Location: [facility] CATH LAB;  Service: Cardiovascular;  Laterality: N/A;  . TONSILLECTOMY  1941, 1951  . TOTAL KNEE ARTHROPLASTY  1997, 2007   Bilateral    Current Outpatient Medications:  .  aspirin EC 81 MG tablet, Take 1 tablet (81 mg total) by mouth daily., Disp: , Rfl:  .  atorvastatin (LIPITOR) 40 MG tablet, Take 1  tablet (40 mg total) by mouth daily., Disp: 90 tablet, Rfl: 3 .  dapsone 100 MG tablet, TAKE 1 TABLET(100 MG) BY MOUTH DAILY (Patient taking differently: Take 100 mg by mouth daily. ), Disp: 90 tablet, Rfl: 1 .  fluticasone (FLONASE) 50 MCG/ACT nasal spray, Place 2 sprays into both nostrils daily as needed for allergies or rhinitis., Disp: 16 g, Rfl: 3 .  furosemide (LASIX) 20 MG tablet, Take 1 tablet by mouth daily as needed for swelling. (Patient taking differently: Take 20 mg by mouth daily as needed for fluid or edema. ), Disp: 30 tablet, Rfl: 11 .  InFLIXimab (REMICADE IV), Inject 5 mg into the vein every 14 (fourteen) days. , Disp: , Rfl:  .  levalbuterol (XOPENEX HFA) 45 MCG/ACT inhaler, Inhale 2 puffs into the lungs every 6 (six) hours as needed for wheezing., Disp: 1 Inhaler, Rfl: 12 .  metoprolol succinate (TOPROL XL) 25 MG 24 hr tablet, Take 1 tablet (25 mg total) by mouth daily., Disp: 30 tablet, Rfl: 0 .  mirtazapine (REMERON) 15 MG tablet, Take 1 tablet (15 mg total) by mouth at bedtime., Disp: 90 tablet, Rfl: 3 .  mycophenolate (CELLCEPT) 500 MG tablet, 2 tabs bid, Disp: 360 tablet, Rfl: 3 .  Nintedanib (OFEV) 100 MG CAPS, Take 1 capsule (100 mg total) by mouth 2 (two) times daily.,  Disp: 60 capsule, Rfl: 3 .  nitroGLYCERIN (NITROSTAT) 0.4 MG SL tablet, Place 1 tablet (0.4 mg total) under the tongue every 5 (five) minutes as needed for chest pain., Disp: 25 tablet, Rfl: 3 .  NONFORMULARY OR COMPOUNDED ITEM, Antifungal solution: Terbinafine 3%, Fluconazole 2%, Tea Tree Oil 5%, Urea 10%, Ibuprofen 2% in DMSO suspension #72mL, Disp: 1 each, Rfl: 3 .  nystatin cream (MYCOSTATIN), Apply topically., Disp: , Rfl:  .  Polyethyl Glycol-Propyl Glycol (SYSTANE) 0.4-0.3 % SOLN, Apply 1 drop to eye daily as needed (for dry eyes)., Disp: , Rfl:  .  polyethylene glycol powder (GLYCOLAX/MIRALAX) powder, Take 17 g by mouth 2 (two) times daily as needed. (Patient taking differently: Take 17 g by mouth 2  (two) times daily as needed for mild constipation. ), Disp: 3350 g, Rfl: 1 .  predniSONE (DELTASONE) 5 MG tablet, Take 1 tablet (5 mg total) by mouth daily with breakfast., Disp: 90 tablet, Rfl: 1 .  triamcinolone cream (KENALOG) 0.1 %, Apply 1 application topically 2 (two) times daily., Disp: 453.6 g, Rfl: 0 .  valACYclovir (VALTREX) 1000 MG tablet, TAKE 1 TABLET TWICE A DAY, Disp: 60 tablet, Rfl: 11 Allergies  Allergen Reactions  . Crestor [Rosuvastatin] Other (See Comments)    Muscle pain  . Lactose Intolerance (Gi) Other (See Comments)    Stomach upset  . Penicillins Hives  . Imuran [Azathioprine] Nausea And Vomiting   Social History   Occupational History  . Occupation: Publishing rights manager    Comment: retired  Tobacco Use  . Smoking status: Former Smoker    Packs/day: 0.50    Years: 20.00    Pack years: 10.00    Types: Cigarettes    Quit date: 07/11/1973    Years since quitting: 46.7  . Smokeless tobacco: Never Used  Vaping Use  . Vaping Use: Never used  Substance and Sexual Activity  . Alcohol use: No  . Drug use: No  . Sexual activity: Never    Objective:   Constitutional [name] [name] is a pleasant 86 y.o. African American female, WD, WN in NAD.[name] AAO x 3.   Vascular Dorsalis pedis pulses palpable bilaterally. Posterior tibial pulses palpable bilaterally. Capillary refill normal to all digits.  No cyanosis or clubbing noted. Pedal hair growth diminished b/l. Lower extremity skin temperature gradient within normal limits. No edema noted b/l lower extremities.  Neurologic Normal speech. Oriented to person, place, and time. Epicritic sensation to light touch grossly present bilaterally. Protective sensation intact 5/5 intact bilaterally with 10g monofilament b/l. Vibratory sensation intact b/l.  Dermatologic Pedal skin with normal turgor, texture and tone b/l.  Toenails are discolored, thick, elongated, dystrophic with pain on palpation x 10. There is  noted onchyolysis of entire nailplate of the left hallux with exposed nailbed.  The nailbed remains intact. There is no erythema, no edema, no drainage, no underlying flocculence. No open wounds. No skin lesions. Remaining toenails are loose, soft and friable with no underlying wounds. Nailbeds remain intact. No interdigital macerations bilaterally. Hyperkeratotic lesion(s) L hallux, R hallux, submet head 1 left foot and submet head 4 right foot.  No erythema, no edema, no drainage, no flocculence.  Orthopedic: Normal muscle strength 5/5 to all lower extremity muscle groups bilaterally. Hallux valgus with bunion deformity noted b/l lower extremities. No bony tenderness.    Radiographs: None Assessment:   1. Pain due to onychomycosis of toenails of both feet   2. Callus   3. Pain in both feet  Plan:  Patient was evaluated and treated and all questions answered.  Onychomycosis with pain -Nails palliatively debridement as below -Educated on self-care  Procedure: Nail Debridement Rationale: Pain Type of Debridement: manual, sharp debridement. Instrumentation: Nail nipper, rotary burr. Number of Nails: 10 -Examined patient. -Medicare ABN signed for this year for paring of calluses. Copy given to patient on today's visit and copy placed in patient's chart.  . -Discontinue compounded topical antifungal solution.  -Toenails 2-5 b/l and right hallux were debrided in length and girth with sterile nail nippers and dremel without iatrogenic bleeding. Left hallux nail plate gently debrided. Light bleeding addressed with Lumicain Hemostatic Solution. Triple antibiotic ointment applied to all digits. -She was instructed to apply Neosporin with Pain Relief to toes once daily for 2 weeks. -Callus(es) L hallux, R hallux, submet head 1 left foot and submet head 4 right foot pared utilizing sterile scalpel blade without complication or incident. Total number debrided =4. -Patient to report any pedal  injuries to medical professional immediately. -Patient to continue soft, supportive shoe gear daily. -Patient/POA to call should there be question/concern in the interim.  Return in about 3 months (around 07/03/2020).  Marzetta Board, DPM

## 2020-04-13 ENCOUNTER — Encounter: Payer: Self-pay | Admitting: Family

## 2020-04-13 ENCOUNTER — Other Ambulatory Visit: Payer: Self-pay

## 2020-04-13 ENCOUNTER — Ambulatory Visit (INDEPENDENT_AMBULATORY_CARE_PROVIDER_SITE_OTHER): Payer: Medicare Other | Admitting: Family

## 2020-04-13 VITALS — BP 108/62 | HR 90 | Temp 98.0°F | Ht 64.5 in | Wt 104.0 lb

## 2020-04-13 DIAGNOSIS — R21 Rash and other nonspecific skin eruption: Secondary | ICD-10-CM | POA: Diagnosis not present

## 2020-04-13 MED ORDER — NYSTATIN-TRIAMCINOLONE 100000-0.1 UNIT/GM-% EX CREA: 1.0000 "application " | TOPICAL_CREAM | Freq: Two times a day (BID) | CUTANEOUS | 0 refills | Status: DC

## 2020-04-13 NOTE — Progress Notes (Signed)
[name] [name] is a 86 y.o. female with the following history as recorded in EpicCare:  Patient Active Problem List   Diagnosis Date Noted  . Fall 01/25/2020  . Other fatigue 01/03/2020  . Left shoulder pain 01/03/2020  . Urinary frequency 01/03/2020  . Acute otalgia, left 09/06/2019  . Unintentional weight loss 07/18/2019  . Personal history of PE (pulmonary embolism) 04/23/2019  . Headache 02/11/2019  . Iron deficiency anemia 12/21/2018  . Cold sore 10/23/2018  . Blood in stool 09/14/2018  . 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 Therapeutic drug monitoring 07/17/2018  . Chronic diastolic CHF (congestive heart failure) ([place]) 12/19/2017  . Rash 08/11/2017  . Chronic respiratory failure with hypoxia ([place]) 03/30/2017  . Angular cheilitis 03/08/2017  . Leg pain 10/25/2016  . Back pain 08/05/2016  . RA (rheumatoid arthritis) ([place]) 03/31/2015  . Allergic rhinitis   . Varicose vein 09/11/2014  . Chest pain 02/27/2012  . CAD (coronary artery disease) 02/27/2012  . Pruritus 08/18/2009  . Postinflammatory pulmonary fibrosis / RA ILD  06/30/2008  . Constipation 01/03/2008  . Dyslipidemia 06/16/2007  . Personal history of DVT (deep vein thrombosis) 06/16/2007    Current Outpatient Medications  Medication Sig Dispense Refill  . aspirin EC 81 MG tablet Take 1 tablet (81 mg total) by mouth daily.    [name] atorvastatin (LIPITOR) 40 MG tablet Take 1 tablet (40 mg total) by mouth daily. 90 tablet 3  . dapsone 100 MG tablet TAKE 1 TABLET(100 MG) BY MOUTH DAILY (Patient taking differently: Take 100 mg by mouth daily. ) 90 tablet 1  . fluticasone (FLONASE) 50 MCG/ACT nasal spray Place 2 sprays into both nostrils daily as needed for allergies or rhinitis. 16 g 3  . furosemide (LASIX) 20 MG tablet Take 1 tablet by mouth daily as needed for swelling. (Patient taking differently: Take 20 mg by mouth daily as needed for fluid or edema. ) 30 tablet 11  . InFLIXimab (REMICADE IV) Inject 5 mg into the vein every  14 (fourteen) days.     [name] levalbuterol (XOPENEX HFA) 45 MCG/ACT inhaler Inhale 2 puffs into the lungs every 6 (six) hours as needed for wheezing. 1 Inhaler 12  . metoprolol succinate (TOPROL XL) 25 MG 24 hr tablet Take 1 tablet (25 mg total) by mouth daily. 30 tablet 0  . mirtazapine (REMERON) 15 MG tablet Take 1 tablet (15 mg total) by mouth at bedtime. 90 tablet 3  . mycophenolate (CELLCEPT) 500 MG tablet 2 tabs bid 360 tablet 3  . Nintedanib (OFEV) 100 MG CAPS Take 1 capsule (100 mg total) by mouth 2 (two) times daily. 60 capsule 3  . NONFORMULARY OR COMPOUNDED ITEM Antifungal solution: Terbinafine 3%, Fluconazole 2%, Tea Tree Oil 5%, Urea 10%, Ibuprofen 2% in DMSO suspension #57mL 1 each 3  . nystatin cream (MYCOSTATIN) Apply topically.    [name] Glycol-Propyl Glycol (SYSTANE) 0.4-0.3 % SOLN Apply 1 drop to eye daily as needed (for dry eyes).    . polyethylene glycol powder (GLYCOLAX/MIRALAX) powder Take 17 g by mouth 2 (two) times daily as needed. (Patient taking differently: Take 17 g by mouth 2 (two) times daily as needed for mild constipation. ) 3350 g 1  . predniSONE (DELTASONE) 5 MG tablet Take 1 tablet (5 mg total) by mouth daily with breakfast. 90 tablet 1  . triamcinolone cream (KENALOG) 0.1 % Apply 1 application topically 2 (two) times daily. 453.6 g 0  . nitroGLYCERIN (NITROSTAT) 0.4 MG SL tablet Place 1  tablet (0.4 mg total) under the tongue every 5 (five) minutes as needed for chest pain. 25 tablet 3  . nystatin-triamcinolone (MYCOLOG II) cream Apply 1 application topically 2 (two) times daily. 30 g 0  . valACYclovir (VALTREX) 1000 MG tablet TAKE 1 TABLET TWICE A DAY (Patient not taking: Reported on 04/13/2020) 60 tablet 11   No current facility-administered medications for this visit.    Allergies: Crestor [rosuvastatin], Lactose intolerance (gi), Penicillins, and Imuran [azathioprine]  Past Medical History:  Diagnosis Date  . Allergic rhinitis   . Allergy   . CAD  (coronary artery disease)    Mild CAD by cath 2008  . DJD (degenerative joint disease)    rheumatoid  . DVT (deep venous thrombosis) ([place])   . Dyslipidemia   . History of echocardiogram    Echo 12/2018: EF 60-65, mod asymmetric LVH, Gr 1 DD  . History of nuclear stress test    Myoview 5/17:  EF 74%, normal perfusion, low risk // Myoview 12/2018:  EF 89, very mild ischemia in inferoapical wall; Low Risk  . History of pulmonary embolism   . Hyperlipidemia   . Insomnia   . Osteoporosis   . Psoriasis   . Pulmonary fibrosis ([place])   . Rheumatoid arthritis [facility])     Past Surgical History:  Procedure Laterality Date  . ABDOMINAL HYSTERECTOMY  1974  . APPENDECTOMY  1959  . CATARACT EXTRACTION    . LEFT HEART CATHETERIZATION WITH CORONARY ANGIOGRAM N/A 06/18/2013   Procedure: LEFT HEART CATHETERIZATION WITH CORONARY ANGIOGRAM;  Surgeon: Peter M Martinique, MD;  Location: [facility] CATH LAB;  Service: Cardiovascular;  Laterality: N/A;  . TONSILLECTOMY  1941, 1951  . TOTAL KNEE ARTHROPLASTY  1997, 2007   Bilateral    Family History  Problem Relation Age of Onset  . Kidney disease Daughter 5  . Cancer Daughter   . Heart attack Father 66  . Alzheimer's disease Sister   . Heart disease Sister   . Alzheimer's disease Sister     Social History   Tobacco Use  . Smoking status: Former Smoker    Packs/day: 0.50    Years: 20.00    Pack years: 10.00    Types: Cigarettes    Quit date: 07/11/1973    Years since quitting: 46.7  . Smokeless tobacco: Never Used  Substance Use Topics  . Alcohol use: No    Subjective:  2 week history of "itching" in her umbilical region; was not concerned until she read an article that itching could be a sign of cancer; notes she was just hoping for reassurance today; did have labs 2 months ago which were essentially stable; denies any new soaps, foods, detergents or medications.    Objective:  Vitals:   04/13/20 1419  BP: 108/62  Pulse: 90  Temp: 98 F (36.7 C)   TempSrc: Oral  SpO2: 93%  Weight: 104 lb (47.2 kg)  Height: 5' 4.5" (1.638 m)    General: Well developed, well nourished, in no acute distress  Skin : Warm and dry.  Head: Normocephalic and atraumatic  Lungs: Respirations unlabored; on oxygen Neurologic: Alert and oriented; speech intact; face symmetrical; moves all extremities well; CNII-XII intact without focal deficit   Assessment:  1. Rash     Plan:  Reassurance; try treating with Mycolog II apply bid to affected area;  This visit occurred during the SARS-CoV-2 public health emergency.  Safety protocols were in place, including screening questions prior to the visit, additional usage  of staff PPE, and extensive cleaning of exam room while observing appropriate contact time as indicated for disinfecting solutions.     No follow-ups on file.  No orders of the defined types were placed in this encounter.   Requested Prescriptions   Signed Prescriptions Disp Refills  . nystatin-triamcinolone (MYCOLOG II) cream 30 g 0    Sig: Apply 1 application topically 2 (two) times daily.

## 2020-04-14 DIAGNOSIS — M0579 Rheumatoid arthritis with rheumatoid factor of multiple sites without organ or systems involvement: Secondary | ICD-10-CM | POA: Diagnosis not present

## 2020-04-20 DIAGNOSIS — Z681 Body mass index (BMI) 19 or less, adult: Secondary | ICD-10-CM | POA: Diagnosis not present

## 2020-04-20 DIAGNOSIS — M0579 Rheumatoid arthritis with rheumatoid factor of multiple sites without organ or systems involvement: Secondary | ICD-10-CM | POA: Diagnosis not present

## 2020-04-20 DIAGNOSIS — L409 Psoriasis, unspecified: Secondary | ICD-10-CM | POA: Diagnosis not present

## 2020-04-20 DIAGNOSIS — J849 Interstitial pulmonary disease, unspecified: Secondary | ICD-10-CM | POA: Diagnosis not present

## 2020-04-20 DIAGNOSIS — Z79899 Other long term (current) drug therapy: Secondary | ICD-10-CM | POA: Diagnosis not present

## 2020-04-20 NOTE — Telephone Encounter (Signed)
[name] [name] with [employer] called and is requesting the status of some forms that were faxed back in August 2021. Add on dispaline Order number [id number].     [phone]

## 2020-04-21 NOTE — Telephone Encounter (Signed)
I will be looking out for these faxes.

## 2020-04-27 NOTE — Progress Notes (Signed)
Cardiology Office Note    Date:  04/29/2020   ID:  [name], [name] 10/18/1930, MRN [id number]  PCP:  Hoyt Koch, MD  Cardiologist: Lauree Chandler, MD EPS: None  Chief Complaint  Patient presents with  . Follow-up    History of Present Illness:  [name] [name] is a 86 y.o. female with history of chest pain, CAD cath 2014-30% mid LAD, 30% mid circumflex 20% mid RCA.  NST 11/2015 no ischemia, NST 2018 no ischemia, NST 12/27/2018 low risk with no large areas of ischemia Echo 2020 normal LV function no valve problems.  Also has history of DVT/PE, HLD and pulmonary fibrosis.  Last saw Dr. Angelena Form 01/10/2019    Most recently in the ER 02/12/2020 with chest pain shortness of breath.  Troponins negative BNP normal EKG unchanged. Hospitalized after fall 01/2020 felt secondary to pulmonary fibrosis meds. Orthostatics were negative.  Patient comes in for f/u. Has a chest heaviness with exertion associated with shortness of breath, low O2 sats below 90. Occurs about once a week and eases within 15 min and increasing O2 3L. Becoming more frequent since July. BP tends to run low 106/60's at home. Still having some swelling-taking lasix about 3 times/week. Using compression hose.Food is brought in by relatives, eats a lot of soup. Has lost about 40 lbs in the past 2 yrs.     Past Medical History:  Diagnosis Date  . Allergic rhinitis   . Allergy   . CAD (coronary artery disease)    Mild CAD by cath 2008  . DJD (degenerative joint disease)    rheumatoid  . DVT (deep venous thrombosis) ([place])   . Dyslipidemia   . History of echocardiogram    Echo 12/2018: EF 60-65, mod asymmetric LVH, Gr 1 DD  . History of nuclear stress test    Myoview 5/17:  EF 74%, normal perfusion, low risk // Myoview 12/2018:  EF 89, very mild ischemia in inferoapical wall; Low Risk  . History of pulmonary embolism   . Hyperlipidemia   . Insomnia   . Osteoporosis   . Psoriasis   . Pulmonary fibrosis ([place])    . Rheumatoid arthritis [facility])     Past Surgical History:  Procedure Laterality Date  . ABDOMINAL HYSTERECTOMY  1974  . APPENDECTOMY  1959  . CATARACT EXTRACTION    . LEFT HEART CATHETERIZATION WITH CORONARY ANGIOGRAM N/A 06/18/2013   Procedure: LEFT HEART CATHETERIZATION WITH CORONARY ANGIOGRAM;  Surgeon: Peter M Martinique, MD;  Location: [facility] CATH LAB;  Service: Cardiovascular;  Laterality: N/A;  . TONSILLECTOMY  1941, 1951  . TOTAL KNEE ARTHROPLASTY  1997, 2007   Bilateral    Current Medications: Current Meds  Medication Sig  . aspirin EC 81 MG tablet Take 1 tablet (81 mg total) by mouth daily.  [name] atorvastatin (LIPITOR) 40 MG tablet Take 1 tablet (40 mg total) by mouth daily.  . dapsone 100 MG tablet TAKE 1 TABLET(100 MG) BY MOUTH DAILY (Patient taking differently: Take 100 mg by mouth daily. )  . fluticasone (FLONASE) 50 MCG/ACT nasal spray Place 2 sprays into both nostrils daily as needed for allergies or rhinitis.  . furosemide (LASIX) 20 MG tablet Take 1 tablet by mouth daily as needed for swelling. (Patient taking differently: Take 20 mg by mouth daily as needed for fluid or edema. )  . InFLIXimab (REMICADE IV) Inject 5 mg into the vein every 14 (fourteen) days.   [name] levalbuterol (XOPENEX HFA) 45 MCG/ACT inhaler  Inhale 2 puffs into the lungs every 6 (six) hours as needed for wheezing.  . metoprolol succinate (TOPROL XL) 25 MG 24 hr tablet Take 1 tablet (25 mg total) by mouth daily.  . mirtazapine (REMERON) 15 MG tablet Take 1 tablet (15 mg total) by mouth at bedtime.  . mycophenolate (CELLCEPT) 500 MG tablet 2 tabs bid  . Nintedanib (OFEV) 100 MG CAPS Take 1 capsule (100 mg total) by mouth 2 (two) times daily.  . nitroGLYCERIN (NITROSTAT) 0.4 MG SL tablet Place 1 tablet (0.4 mg total) under the tongue every 5 (five) minutes as needed for chest pain.  . NONFORMULARY OR COMPOUNDED ITEM Antifungal solution: Terbinafine 3%, Fluconazole 2%, Tea Tree Oil 5%, Urea 10%, Ibuprofen 2% in DMSO  suspension #32mL  . nystatin cream (MYCOSTATIN) Apply topically.  . nystatin-triamcinolone (MYCOLOG II) cream Apply 1 application topically 2 (two) times daily.  [name] Glycol-Propyl Glycol (SYSTANE) 0.4-0.3 % SOLN Apply 1 drop to eye daily as needed (for dry eyes).  . polyethylene glycol powder (GLYCOLAX/MIRALAX) powder Take 17 g by mouth 2 (two) times daily as needed. (Patient taking differently: Take 17 g by mouth 2 (two) times daily as needed for mild constipation. )  . predniSONE (DELTASONE) 5 MG tablet Take 1 tablet (5 mg total) by mouth daily with breakfast.  . triamcinolone cream (KENALOG) 0.1 % Apply 1 application topically 2 (two) times daily.     Allergies:   Crestor [rosuvastatin], Lactose intolerance (gi), Penicillins, and Imuran [azathioprine]   Social History   Socioeconomic History  . Marital status: Widowed    Spouse name: Not on file  . Number of children: 2  . Years of education: Not on file  . Highest education level: Not on file  Occupational History  . Occupation: Publishing rights manager    Comment: retired  Tobacco Use  . Smoking status: Former Smoker    Packs/day: 0.50    Years: 20.00    Pack years: 10.00    Types: Cigarettes    Quit date: 07/11/1973    Years since quitting: 46.8  . Smokeless tobacco: Never Used  Vaping Use  . Vaping Use: Never used  Substance and Sexual Activity  . Alcohol use: No  . Drug use: No  . Sexual activity: Never  Other Topics Concern  . Not on file  Social History Narrative   Diet:   Do you drink/eat things with caffeine? Yes   Marital status:        Widowed                      What year were you married?1954   Do you live in a house, apartment, assisted living, condo, trailer, etc)? House   Is it one or more stories? 1 1/4   How many persons live in your home?1   Do you have any pets in your home? No   Current or past profession:  Publishing rights manager   Do you exercise?        Yes                                             Type & how often:  Walk  Do you have a living will? Yes   Do you have a DNR Form? Yes   Do you have a POA/HPOA forms?  Yes   Social Determinants of Health   Financial Resource Strain:   . Difficulty of Paying Living Expenses: Not on file  Food Insecurity: No Food Insecurity  . Worried About Charity fundraiser in the Last Year: Never true  . Ran Out of Food in the Last Year: Never true  Transportation Needs: No Transportation Needs  . Lack of Transportation (Medical): No  . Lack of Transportation (Non-Medical): No  Physical Activity:   . Days of Exercise per Week: Not on file  . Minutes of Exercise per Session: Not on file  Stress:   . Feeling of Stress : Not on file  Social Connections: Moderately Integrated  . Frequency of Communication with Friends and Family: More than three times a week  . Frequency of Social Gatherings with Friends and Family: More than three times a week  . Attends Religious Services: 1 to 4 times per year  . Active Member of Clubs or Organizations: Yes  . Attends Archivist Meetings: Never  . Marital Status: Widowed     Family History:  The patient's family history includes Alzheimer's disease in her sister and sister; Cancer in her daughter; Heart attack (age of onset: 31) in her father; Heart disease in her sister; Kidney disease (age of onset: 29) in her daughter.   ROS:   Please see the history of present illness.    ROS All other systems reviewed and are negative.   PHYSICAL EXAM:   VS:  BP (!) 100/52   Pulse 96   Ht 5' 4.5" (1.638 m)   Wt 103 lb (46.7 kg)   SpO2 90%   BMI 17.41 kg/m   Physical Exam  GEN: Thin, in no acute distress  Neck: no JVD, carotid bruits, or masses Cardiac:RRR; 1/6 systolic murmur LSB Respiratory:  Decreased breath sounds throughout with fine crackles GI: soft, nontender, nondistended, + BS Ext: without cyanosis, clubbing, or edema, Good distal  pulses bilaterally Neuro:  Alert and Oriented x 3 Psych: euthymic mood, full affect  Wt Readings from Last 3 Encounters:  04/29/20 103 lb (46.7 kg)  04/13/20 104 lb (47.2 kg)  03/10/20 107 lb 6.4 oz (48.7 kg)      Studies/Labs Reviewed:   EKG:  EKG is not ordered today.    Recent Labs: 01/24/2020: ALT 17 01/25/2020: Magnesium 1.5; TSH 5.379 02/12/2020: B Natriuretic Peptide 62.2; BUN 15; Creatinine, Ser 0.58; Hemoglobin 10.0; Platelets 229; Potassium 3.8; Sodium 139   Lipid Panel    Component Value Date/Time   CHOL 160 07/09/2019 1050   TRIG 145 07/09/2019 1050   HDL 58 07/09/2019 1050   CHOLHDL 2.8 07/09/2019 1050   CHOLHDL 3.1 10/13/2015 0945   VLDL 26 10/13/2015 0945   LDLCALC 77 07/09/2019 1050    Additional studies/ records that were reviewed today include:  NST 2017 Study Highlights    Nuclear stress EF: 97%.  The study is normal. No ischemia  This is a low risk study.     Echo  01/26/2020: IMPRESSIONS     1. Left ventricular ejection fraction, by estimation, is 55 to 60%. The  left ventricle has normal function. The left ventricle has no regional  wall motion abnormalities. Left ventricular diastolic parameters are  indeterminate. Elevated left atrial  pressure.   2. Right ventricular systolic function is normal. The right  ventricular  size is normal. There is normal pulmonary artery systolic pressure.   3. The mitral valve is normal in structure. No evidence of mitral valve  regurgitation.   4. The aortic valve is tricuspid. Aortic valve regurgitation is not  visualized. Mild aortic valve sclerosis is present, with no evidence of  aortic valve stenosis.   5. The inferior vena cava is normal in size with <50% respiratory  variability, suggesting right atrial pressure of 8 mmHg.   NST 12/2018  Nuclear stress EF: 89%.  There was no ST segment deviation noted during stress.  Defect 1: There is a small defect of mild severity present in the apical  inferior location.  Findings consistent with ischemia.  This is a low risk study.  The left ventricular ejection fraction is hyperdynamic (>65%).   Abnormal, low risk stress nuclear study with very mild ischemia in the inferoapical wall.  Gated ejection fraction 89% with normal wall motion.      Cardiac cath 06/18/13: Left mainstem: Normal  Left anterior descending (LAD): Mild calcification. 30% disease in the mid vessel at the take off of the first diagonal.  Left circumflex (LCx): 30% disease in the mid vessel.  Right coronary artery (RCA): Mild calcification. Mild disease in the proximal and mid vessel to 20%.  Left ventriculography: Left ventricular systolic function is normal, LVEF is estimated at 55-65%, there is no significant mitral regurgitation           ASSESSMENT:    1. Coronary artery disease involving native coronary artery of native heart without angina pectoris   2. Hyperlipidemia, unspecified hyperlipidemia type   3. Chronic diastolic CHF (congestive heart failure) ([place])   4. Postinflammatory pulmonary fibrosis (H[facility])      PLAN:  In order of problems listed above:  CAD with mild disease on cath in 2014 stress Myoview 12/27/2018 no ischemia.  Was in the ER 02/12/2020 with chest pain shortness of breath.  Troponin negative BNP 62 EKG without acute change. Patient having chest tightness with dyspnea on exertion about once weekly with drop in O2 sats eases within 15 min and increasing O2 3L. She thinks it's related to pulmonary fibrosis. Doesn't want to pursue NST at this time but will call us if it worsens or becomes more frequent. BP low limiting treatment. Discuss caution with NTG use-she hasn't used.  Hyperlipidemia on lipitor-check today.  Chronic diastolic CHF Echo 10/5623 normal LVEF-increased swelling recently related to food from outside the home-eating a lot of soup. Taking lasix 3 times/week and wearing compression stockings. 2 gm sodium diet  Pulmonary  fibrosis on O2     Medication Adjustments/Labs and Tests Ordered: Current medicines are reviewed at length with the patient today.  Concerns regarding medicines are outlined above.  Medication changes, Labs and Tests ordered today are listed in the Patient Instructions below. Patient Instructions   Medication Instructions:  Your physician recommends that you continue on your current medications as directed. Please refer to the Current Medication list given to you today.  *If you need a refill on your cardiac medications before your next appointment, please call your pharmacy*   Lab Work: TODAY: CMET, CBC, FLP  If you have labs (blood work) drawn today and your tests are completely normal, you will receive your results only by: [name] MyChart Message (if you have MyChart) OR . A paper copy in the mail If you have any lab test that is abnormal or we need to change your treatment, we will  call you to review the results.   Follow-Up: At [facility], you and your health needs are our priority.  As part of our continuing mission to provide you with exceptional heart care, we have created designated Provider Care Teams.  These Care Teams include your primary Cardiologist (physician) and Advanced Practice Providers (APPs -  Physician Assistants and Nurse Practitioners) who all work together to provide you with the care you need, when you need it.  We recommend signing up for the patient portal called "MyChart".  Sign up information is provided on this After Visit Summary.  MyChart is used to connect with patients for Virtual Visits (Telemedicine).  Patients are able to view lab/test results, encounter notes, upcoming appointments, etc.  Non-urgent messages can be sent to your provider as well.   To learn more about what you can do with MyChart, go to [email or web address].    Your next appointment:   6 month(s)  The format for your next appointment:   In Person  Provider:   You may see  Lauree Chandler, MD or one of the following Advanced Practice Providers on your designated Care Team:    Melina Copa, PA-C  Ermalinda Barrios, PA-C    Other Instructions Call us if you have worsening Chest Pain  Two Gram Sodium Diet 2000 mg  What is Sodium? Sodium is a mineral found naturally in many foods. The most significant source of sodium in the diet is table salt, which is about 40% sodium.  Processed, convenience, and preserved foods also contain a large amount of sodium.  The body needs only 500 mg of sodium daily to function,  A normal diet provides more than enough sodium even if you do not use salt.  Why Limit Sodium? A build up of sodium in the body can cause thirst, increased blood pressure, shortness of breath, and water retention.  Decreasing sodium in the diet can reduce edema and risk of heart attack or stroke associated with high blood pressure.  Keep in mind that there are many other factors involved in these health problems.  Heredity, obesity, lack of exercise, cigarette smoking, stress and what you eat all play a role.  General Guidelines:  Do not add salt at the table or in cooking.  One teaspoon of salt contains over 2 grams of sodium.  Read food labels  Avoid processed and convenience foods  Ask your dietitian before eating any foods not dicussed in the menu planning guidelines  Consult your physician if you wish to use a salt substitute or a sodium containing medication such as antacids.  Limit milk and milk products to 16 oz (2 cups) per day.  Shopping Hints:  READ LABELS!! "Dietetic" does not necessarily mean low sodium.  Salt and other sodium ingredients are often added to foods during processing.   Menu Planning Guidelines Food Group Choose More Often Avoid  Beverages (see also the milk group All fruit juices, low-sodium, salt-free vegetables juices, low-sodium carbonated beverages Regular vegetable or tomato juices, commercially softened water  used for drinking or cooking  Breads and Cereals Enriched white, wheat, rye and pumpernickel bread, hard rolls and dinner rolls; muffins, cornbread and waffles; most dry cereals, cooked cereal without added salt; unsalted crackers and breadsticks; low sodium or homemade bread crumbs Bread, rolls and crackers with salted tops; quick breads; instant hot cereals; pancakes; commercial bread stuffing; self-rising flower and biscuit mixes; regular bread crumbs or cracker crumbs  Desserts and Sweets Desserts and sweets mad with mild  should be within allowance Instant pudding mixes and cake mixes  Fats Butter or margarine; vegetable oils; unsalted salad dressings, regular salad dressings limited to 1 Tbs; light, sour and heavy cream Regular salad dressings containing bacon fat, bacon bits, and salt pork; snack dips made with instant soup mixes or processed cheese; salted nuts  Fruits Most fresh, frozen and canned fruits Fruits processed with salt or sodium-containing ingredient (some dried fruits are processed with sodium sulfites        Vegetables Fresh, frozen vegetables and low- sodium canned vegetables Regular canned vegetables, sauerkraut, pickled vegetables, and others prepared in brine; frozen vegetables in sauces; vegetables seasoned with ham, bacon or salt pork  Condiments, Sauces, Miscellaneous  Salt substitute with physician's approval; pepper, herbs, spices; vinegar, lemon or lime juice; hot pepper sauce; garlic powder, onion powder, low sodium soy sauce (1 Tbs.); low sodium condiments (ketchup, chili sauce, mustard) in limited amounts (1 tsp.) fresh ground horseradish; unsalted tortilla chips, pretzels, potato chips, popcorn, salsa (1/4 cup) Any seasoning made with salt including garlic salt, celery salt, onion salt, and seasoned salt; sea salt, rock salt, kosher salt; meat tenderizers; monosodium glutamate; mustard, regular soy sauce, barbecue, sauce, chili sauce, teriyaki sauce, steak sauce,  Worcestershire sauce, and most flavored vinegars; canned gravy and mixes; regular condiments; salted snack foods, olives, picles, relish, horseradish sauce, catsup   Food preparation: Try these seasonings Meats:    Pork Sage, onion Serve with applesauce  Chicken Poultry seasoning, thyme, parsley Serve with cranberry sauce  Lamb Curry powder, rosemary, garlic, thyme Serve with mint sauce or jelly  Veal Marjoram, basil Serve with current jelly, cranberry sauce  Beef Pepper, bay leaf Serve with dry mustard, unsalted chive butter  Fish Bay leaf, dill Serve with unsalted lemon butter, unsalted parsley butter  Vegetables:    Asparagus Lemon juice   Broccoli Lemon juice   Carrots Mustard dressing parsley, mint, nutmeg, glazed with unsalted butter and sugar   Green beans Marjoram, lemon juice, nutmeg,dill seed   Tomatoes Basil, marjoram, onion   Spice /blend for [employer]" 4 tsp ground thyme 1 tsp ground sage 3 tsp ground rosemary 4 tsp ground marjoram   Test your knowledge 1. A product that says "Salt Free" may still contain sodium. True or False 2. Garlic Powder and Hot Pepper Sauce an be used as alternative seasonings.True or False 3. Processed foods have more sodium than fresh foods.  True or False 4. Canned Vegetables have less sodium than froze True or False  WAYS TO DECREASE YOUR SODIUM INTAKE 1. Avoid the use of added salt in cooking and at the table.  Table salt (and other prepared seasonings which contain salt) is probably one of the greatest sources of sodium in the diet.  Unsalted foods can gain flavor from the sweet, sour, and butter taste sensations of herbs and spices.  Instead of using salt for seasoning, try the following seasonings with the foods listed.  Remember: how you use them to enhance natural food flavors is limited only by your creativity... Allspice-Meat, fish, eggs, fruit, peas, red and yellow vegetables Almond Extract-Fruit baked goods Anise Seed-Sweet breads,  fruit, carrots, beets, cottage cheese, cookies (tastes like licorice) Basil-Meat, fish, eggs, vegetables, rice, vegetables salads, soups, sauces Bay Leaf-Meat, fish, stews, poultry Burnet-Salad, vegetables (cucumber-like flavor) Caraway Seed-Bread, cookies, cottage cheese, meat, vegetables, cheese, rice Cardamon-Baked goods, fruit, soups Celery Powder or seed-Salads, salad dressings, sauces, meatloaf, soup, bread.Do not use  celery salt Chervil-Meats, salads, fish, eggs, vegetables, cottage  cheese (parsley-like flavor) Chili Power-Meatloaf, chicken cheese, corn, eggplant, egg dishes Chives-Salads cottage cheese, egg dishes, soups, vegetables, sauces Cilantro-Salsa, casseroles Cinnamon-Baked goods, fruit, pork, lamb, chicken, carrots Cloves-Fruit, baked goods, fish, pot roast, green beans, beets, carrots Coriander-Pastry, cookies, meat, salads, cheese (lemon-orange flavor) Cumin-Meatloaf, fish,cheese, eggs, cabbage,fruit pie (caraway flavor) [employer], fruit, eggs, fish, poultry, cottage cheese, vegetables Dill Seed-Meat, cottage cheese, poultry, vegetables, fish, salads, bread Fennel Seed-Bread, cookies, apples, pork, eggs, fish, beets, cabbage, cheese, Licorice-like flavor Garlic-(buds or powder) Salads, meat, poultry, fish, bread, butter, vegetables, potatoes.Do not  use garlic salt Ginger-Fruit, vegetables, baked goods, meat, fish, poultry Horseradish Root-Meet, vegetables, butter Lemon Juice or Extract-Vegetables, fruit, tea, baked goods, fish salads Mace-Baked goods fruit, vegetables, fish, poultry (taste like nutmeg) Maple Extract-Syrups Marjoram-Meat, chicken, fish, vegetables, breads, green salads (taste like Sage) Mint-Tea, lamb, sherbet, vegetables, desserts, carrots, cabbage Mustard, Dry or Seed-Cheese, eggs, meats, vegetables, poultry Nutmeg-Baked goods, fruit, chicken, eggs, vegetables, desserts Onion Powder-Meat, fish, poultry, vegetables, cheese, eggs, bread, rice  salads (Do not use   Onion salt) Orange Extract-Desserts, baked goods Oregano-Pasta, eggs, cheese, onions, pork, lamb, fish, chicken, vegetables, green salads Paprika-Meat, fish, poultry, eggs, cheese, vegetables Parsley Flakes-Butter, vegetables, meat fish, poultry, eggs, bread, salads (certain forms may   Contain sodium Pepper-Meat fish, poultry, vegetables, eggs Peppermint Extract-Desserts, baked goods Poppy Seed-Eggs, bread, cheese, fruit dressings, baked goods, noodles, vegetables, cottage  [employer], poultry, meat, fish, cauliflower, turnips,eggs bread Saffron-Rice, bread, veal, chicken, fish, eggs Sage-Meat, fish, poultry, onions, eggplant, tomateos, pork, stews Savory-Eggs, salads, poultry, meat, rice, vegetables, soups, pork Tarragon-Meat, poultry, fish, eggs, butter, vegetables (licorice-like flavor)  Thyme-Meat, poultry, fish, eggs, vegetables, (clover-like flavor), sauces, soups Tumeric-Salads, butter, eggs, fish, rice, vegetables (saffron-like flavor) Vanilla Extract-Baked goods, candy Vinegar-Salads, vegetables, meat marinades Walnut Extract-baked goods, candy  2. Choose your Foods Wisely   The following is a list of foods to avoid which are high in sodium:  Meats-Avoid all smoked, canned, salt cured, dried and kosher meat and fish as well as Anchovies   [employer] meats:Bologna, Liverwurst, Pastrami Canned meat or fish  Marinated herring Caviar    Pepperoni Corned Beef   Pizza Dried chipped beef  Salami Frozen breaded fish or meat Salt pork Frankfurters or hot dogs  Sardines Gefilte fish   Sausage Ham (boiled ham, Proscuitto Smoked butt    spiced ham)   Spam      TV Dinners Vegetables Canned vegetables (Regular) Relish Canned mushrooms  Sauerkraut Olives    Tomato juice Pickles  Bakery and Dessert Products Canned puddings  Cream pies Cheesecake   Decorated cakes Cookies  Beverages/Juices Tomato  juice, regular  Gatorade   V-8 vegetable juice, regular  Breads and Cereals Biscuit mixes   Salted potato chips, corn chips, pretzels Bread stuffing mixes  Salted crackers and rolls Pancake and waffle mixes Self-rising flour  Seasonings Accent    Meat sauces Barbecue sauce  Meat tenderizer Catsup    Monosodium glutamate (MSG) Celery salt   Onion salt Chili sauce   Prepared mustard Garlic salt   Salt, seasoned salt, sea salt Gravy mixes   Soy sauce Horseradish   Steak sauce Ketchup   Tartar sauce Lite salt    Teriyaki sauce Marinade mixes   Worcestershire sauce  Others Baking powder   Cocoa and cocoa mixes Baking soda   Commercial casserole mixes Candy-caramels, chocolate  Dehydrated soups    Bars, fudge,nougats  Instant rice and pasta mixes Canned broth or soup  Maraschino cherries Cheese, aged and processed cheese and cheese spreads  Learning Assessment Quiz  Indicated T (for True) or F (for False) for each of the following statements:  1. _____ Fresh fruits and vegetables and unprocessed grains are generally low in sodium 2. _____ Water may contain a considerable amount of sodium, depending on the source 3. _____ You can always tell if a food is high in sodium by tasting it 4. _____ Certain laxatives my be high in sodium and should be avoided unless prescribed   by a physician or pharmacist 5. _____ Salt substitutes may be used freely by anyone on a sodium restricted diet 6. _____ Sodium is present in table salt, food additives and as a natural component of   most foods 7. _____ Table salt is approximately 90% sodium 8. _____ Limiting sodium intake may help prevent excess fluid accumulation in the body 9. _____ On a sodium-restricted diet, seasonings such as bouillon soy sauce, and    cooking wine should be used in place of table salt 10. _____ On an ingredient list, a product which lists monosodium glutamate as the first   ingredient is an appropriate food to include on a  low sodium diet  Circle the best answer(s) to the following statements (Hint: there may be more than one correct answer)  11. On a low-sodium diet, some acceptable snack items are:    A. Olives  F. Bean dip   K. Grapefruit juice    B. Salted Pretzels G. Commercial Popcorn   L. Canned peaches    C. Carrot Sticks  H. Bouillon   M. Unsalted nuts   D. [place] fries  I. Peanut butter crackers N. Salami   E. Sweet pickles J. Tomato Juice   O. Pizza  12.  Seasonings that may be used freely on a reduced - sodium diet include   A. Lemon wedges F.Monosodium glutamate K. Celery seed    B.Soysauce   G. Pepper   L. Mustard powder   C. Sea salt  H. Cooking wine  M. Onion flakes   D. Vinegar  E. Prepared horseradish N. Salsa   E. Sage   J. Worcestershire sauce  O. [address]      Sumner Boast, PA-C  04/29/2020 9:46 AM    [facility] [place], [place], [place]  [address] Phone: [phone]; Fax: [phone]

## 2020-04-28 ENCOUNTER — Other Ambulatory Visit: Payer: Self-pay | Admitting: *Deleted

## 2020-04-28 ENCOUNTER — Other Ambulatory Visit: Payer: Self-pay | Admitting: Cardiovascular Disease

## 2020-04-28 NOTE — Patient Outreach (Signed)
[facility]) Care Management  04/28/2020  [name] [name] Nov 14, 1930 [id number]   Call placed to member to follow up on management of chronic medical conditions.  She denies any shortness of breath or chest pain, weights and blood pressure has been stable.  She does still have some shortness of breath with intermittently with activity, relief with rest.  Advised member that there was a no show for pulmonology appointment on 9/23, state she forgot and will call to reschedule. No further complex needs, member agrees to ongoing chronic disease management.  Will place referral to health coach, will notify PCP of transition.  [name] [name], [place], MSN [place] [phone]

## 2020-04-29 ENCOUNTER — Encounter: Payer: Self-pay | Admitting: Physician Assistant

## 2020-04-29 ENCOUNTER — Other Ambulatory Visit: Payer: Self-pay

## 2020-04-29 ENCOUNTER — Ambulatory Visit (INDEPENDENT_AMBULATORY_CARE_PROVIDER_SITE_OTHER): Payer: Medicare Other | Admitting: Physician Assistant

## 2020-04-29 VITALS — BP 100/52 | HR 96 | Ht 64.5 in | Wt 103.0 lb

## 2020-04-29 DIAGNOSIS — I2510 Atherosclerotic heart disease of native coronary artery without angina pectoris: Secondary | ICD-10-CM | POA: Diagnosis not present

## 2020-04-29 DIAGNOSIS — E785 Hyperlipidemia, unspecified: Secondary | ICD-10-CM

## 2020-04-29 DIAGNOSIS — J8410 Pulmonary fibrosis, unspecified: Secondary | ICD-10-CM

## 2020-04-29 DIAGNOSIS — I5032 Chronic diastolic (congestive) heart failure: Secondary | ICD-10-CM

## 2020-04-29 LAB — COMPREHENSIVE METABOLIC PANEL
ALT: 13 IU/L (ref 0–32)
AST: 20 IU/L (ref 0–40)
Albumin/Globulin Ratio: 1.4 (ref 1.2–2.2)
Albumin: 3.8 g/dL (ref 3.6–4.6)
Alkaline Phosphatase: 81 IU/L (ref 44–121)
BUN/Creatinine Ratio: 13 (ref 12–28)
BUN: 11 mg/dL (ref 8–27)
Bilirubin Total: 0.7 mg/dL (ref 0.0–1.2)
CO2: 26 mmol/L (ref 20–29)
Calcium: 9.4 mg/dL (ref 8.7–10.3)
Chloride: 100 mmol/L (ref 96–106)
Creatinine, Ser: 0.87 mg/dL (ref 0.57–1.00)
GFR calc Af Amer: 69 mL/min/{1.73_m2} (ref >59)
GFR calc non Af Amer: 60 mL/min/{1.73_m2} (ref >59)
Globulin, Total: 2.7 g/dL (ref 1.5–4.5)
Glucose: 85 mg/dL (ref 65–99)
Potassium: 3.8 mmol/L (ref 3.5–5.2)
Sodium: 138 mmol/L (ref 134–144)
Total Protein: 6.5 g/dL (ref 6.0–8.5)

## 2020-04-29 LAB — CBC
Hematocrit: 32.8 % — ABNORMAL LOW (ref 34.0–46.6)
Hemoglobin: 10.9 g/dL — ABNORMAL LOW (ref 11.1–15.9)
MCH: 31.4 pg (ref 26.6–33.0)
MCHC: 33.2 g/dL (ref 31.5–35.7)
MCV: 95 fL (ref 79–97)
Platelets: 229 10*3/uL (ref 150–450)
RBC: 3.47 x10E6/uL — ABNORMAL LOW (ref 3.77–5.28)
RDW: 12.6 % (ref 11.7–15.4)
WBC: 7.7 10*3/uL (ref 3.4–10.8)

## 2020-04-29 LAB — LIPID PANEL
Chol/HDL Ratio: 2.6 ratio (ref 0.0–4.4)
Cholesterol, Total: 166 mg/dL (ref 100–199)
HDL: 65 mg/dL (ref >39)
LDL Chol Calc (NIH): 85 mg/dL (ref 0–99)
Triglycerides: 85 mg/dL (ref 0–149)
VLDL Cholesterol Cal: 16 mg/dL (ref 5–40)

## 2020-04-29 NOTE — Patient Instructions (Signed)
Medication Instructions:  Your physician recommends that you continue on your current medications as directed. Please refer to the Current Medication list given to you today.  *If you need a refill on your cardiac medications before your next appointment, please call your pharmacy*   Lab Work: TODAY: CMET, CBC, FLP  If you have labs (blood work) drawn today and your tests are completely normal, you will receive your results only by: [name] MyChart Message (if you have MyChart) OR . A paper copy in the mail If you have any lab test that is abnormal or we need to change your treatment, we will call you to review the results.   Follow-Up: At [facility], you and your health needs are our priority.  As part of our continuing mission to provide you with exceptional heart care, we have created designated Provider Care Teams.  These Care Teams include your primary Cardiologist (physician) and Advanced Practice Providers (APPs -  Physician Assistants and Nurse Practitioners) who all work together to provide you with the care you need, when you need it.  We recommend signing up for the patient portal called "MyChart".  Sign up information is provided on this After Visit Summary.  MyChart is used to connect with patients for Virtual Visits (Telemedicine).  Patients are able to view lab/test results, encounter notes, upcoming appointments, etc.  Non-urgent messages can be sent to your provider as well.   To learn more about what you can do with MyChart, go to [email or web address].    Your next appointment:   6 month(s)  The format for your next appointment:   In Person  Provider:   You may see Lauree Chandler, MD or one of the following Advanced Practice Providers on your designated Care Team:    Melina Copa, PA-C  Ermalinda Barrios, PA-C    Other Instructions Call us if you have worsening Chest Pain  Two Gram Sodium Diet 2000 mg  What is Sodium? Sodium is a mineral found naturally in  many foods. The most significant source of sodium in the diet is table salt, which is about 40% sodium.  Processed, convenience, and preserved foods also contain a large amount of sodium.  The body needs only 500 mg of sodium daily to function,  A normal diet provides more than enough sodium even if you do not use salt.  Why Limit Sodium? A build up of sodium in the body can cause thirst, increased blood pressure, shortness of breath, and water retention.  Decreasing sodium in the diet can reduce edema and risk of heart attack or stroke associated with high blood pressure.  Keep in mind that there are many other factors involved in these health problems.  Heredity, obesity, lack of exercise, cigarette smoking, stress and what you eat all play a role.  General Guidelines:  Do not add salt at the table or in cooking.  One teaspoon of salt contains over 2 grams of sodium.  Read food labels  Avoid processed and convenience foods  Ask your dietitian before eating any foods not dicussed in the menu planning guidelines  Consult your physician if you wish to use a salt substitute or a sodium containing medication such as antacids.  Limit milk and milk products to 16 oz (2 cups) per day.  Shopping Hints:  READ LABELS!! "Dietetic" does not necessarily mean low sodium.  Salt and other sodium ingredients are often added to foods during processing.   Menu Planning Guidelines Food Group Choose More Often  Avoid  Beverages (see also the milk group All fruit juices, low-sodium, salt-free vegetables juices, low-sodium carbonated beverages Regular vegetable or tomato juices, commercially softened water used for drinking or cooking  Breads and Cereals Enriched white, wheat, rye and pumpernickel bread, hard rolls and dinner rolls; muffins, cornbread and waffles; most dry cereals, cooked cereal without added salt; unsalted crackers and breadsticks; low sodium or homemade bread crumbs Bread, rolls and crackers  with salted tops; quick breads; instant hot cereals; pancakes; commercial bread stuffing; self-rising flower and biscuit mixes; regular bread crumbs or cracker crumbs  Desserts and Sweets Desserts and sweets mad with mild should be within allowance Instant pudding mixes and cake mixes  Fats Butter or margarine; vegetable oils; unsalted salad dressings, regular salad dressings limited to 1 Tbs; light, sour and heavy cream Regular salad dressings containing bacon fat, bacon bits, and salt pork; snack dips made with instant soup mixes or processed cheese; salted nuts  Fruits Most fresh, frozen and canned fruits Fruits processed with salt or sodium-containing ingredient (some dried fruits are processed with sodium sulfites        Vegetables Fresh, frozen vegetables and low- sodium canned vegetables Regular canned vegetables, sauerkraut, pickled vegetables, and others prepared in brine; frozen vegetables in sauces; vegetables seasoned with ham, bacon or salt pork  Condiments, Sauces, Miscellaneous  Salt substitute with physician's approval; pepper, herbs, spices; vinegar, lemon or lime juice; hot pepper sauce; garlic powder, onion powder, low sodium soy sauce (1 Tbs.); low sodium condiments (ketchup, chili sauce, mustard) in limited amounts (1 tsp.) fresh ground horseradish; unsalted tortilla chips, pretzels, potato chips, popcorn, salsa (1/4 cup) Any seasoning made with salt including garlic salt, celery salt, onion salt, and seasoned salt; sea salt, rock salt, kosher salt; meat tenderizers; monosodium glutamate; mustard, regular soy sauce, barbecue, sauce, chili sauce, teriyaki sauce, steak sauce, Worcestershire sauce, and most flavored vinegars; canned gravy and mixes; regular condiments; salted snack foods, olives, picles, relish, horseradish sauce, catsup   Food preparation: Try these seasonings Meats:    Pork Sage, onion Serve with applesauce  Chicken Poultry seasoning, thyme, parsley Serve with  cranberry sauce  Lamb Curry powder, rosemary, garlic, thyme Serve with mint sauce or jelly  Veal Marjoram, basil Serve with current jelly, cranberry sauce  Beef Pepper, bay leaf Serve with dry mustard, unsalted chive butter  Fish [place] leaf, dill Serve with unsalted lemon butter, unsalted parsley butter  Vegetables:    Asparagus Lemon juice   Broccoli Lemon juice   Carrots Mustard dressing parsley, mint, nutmeg, glazed with unsalted butter and sugar   Green beans Marjoram, lemon juice, nutmeg,dill seed   Tomatoes Basil, marjoram, onion   Spice /blend for [employer]" 4 tsp ground thyme 1 tsp ground sage 3 tsp ground rosemary 4 tsp ground marjoram   Test your knowledge 1. A product that says "Salt Free" may still contain sodium. True or False 2. Garlic Powder and Hot Pepper Sauce an be used as alternative seasonings.True or False 3. Processed foods have more sodium than fresh foods.  True or False 4. Canned Vegetables have less sodium than froze True or False  WAYS TO DECREASE YOUR SODIUM INTAKE 1. Avoid the use of added salt in cooking and at the table.  Table salt (and other prepared seasonings which contain salt) is probably one of the greatest sources of sodium in the diet.  Unsalted foods can gain flavor from the sweet, sour, and butter taste sensations of herbs and spices.  Instead of  using salt for seasoning, try the following seasonings with the foods listed.  Remember: how you use them to enhance natural food flavors is limited only by your creativity... Allspice-Meat, fish, eggs, fruit, peas, red and yellow vegetables Almond Extract-Fruit baked goods Anise Seed-Sweet breads, fruit, carrots, beets, cottage cheese, cookies (tastes like licorice) Basil-Meat, fish, eggs, vegetables, rice, vegetables salads, soups, sauces Bay Leaf-Meat, fish, stews, poultry Burnet-Salad, vegetables (cucumber-like flavor) Caraway Seed-Bread, cookies, cottage cheese, meat, vegetables, cheese,  rice Cardamon-Baked goods, fruit, soups Celery Powder or seed-Salads, salad dressings, sauces, meatloaf, soup, bread.Do not use  celery salt Chervil-Meats, salads, fish, eggs, vegetables, cottage cheese (parsley-like flavor) Chili Power-Meatloaf, chicken cheese, corn, eggplant, egg dishes Chives-Salads cottage cheese, egg dishes, soups, vegetables, sauces Cilantro-Salsa, casseroles Cinnamon-Baked goods, fruit, pork, lamb, chicken, carrots Cloves-Fruit, baked goods, fish, pot roast, green beans, beets, carrots Coriander-Pastry, cookies, meat, salads, cheese (lemon-orange flavor) Cumin-Meatloaf, fish,cheese, eggs, cabbage,fruit pie (caraway flavor) [employer], fruit, eggs, fish, poultry, cottage cheese, vegetables Dill Seed-Meat, cottage cheese, poultry, vegetables, fish, salads, bread Fennel Seed-Bread, cookies, apples, pork, eggs, fish, beets, cabbage, cheese, Licorice-like flavor Garlic-(buds or powder) Salads, meat, poultry, fish, bread, butter, vegetables, potatoes.Do not  use garlic salt Ginger-Fruit, vegetables, baked goods, meat, fish, poultry Horseradish Root-Meet, vegetables, butter Lemon Juice or Extract-Vegetables, fruit, tea, baked goods, fish salads Mace-Baked goods fruit, vegetables, fish, poultry (taste like nutmeg) Maple Extract-Syrups Marjoram-Meat, chicken, fish, vegetables, breads, green salads (taste like Sage) Mint-Tea, lamb, sherbet, vegetables, desserts, carrots, cabbage Mustard, Dry or Seed-Cheese, eggs, meats, vegetables, poultry Nutmeg-Baked goods, fruit, chicken, eggs, vegetables, desserts Onion Powder-Meat, fish, poultry, vegetables, cheese, eggs, bread, rice salads (Do not use   Onion salt) Orange Extract-Desserts, baked goods Oregano-Pasta, eggs, cheese, onions, pork, lamb, fish, chicken, vegetables, green salads Paprika-Meat, fish, poultry, eggs, cheese, vegetables Parsley Flakes-Butter, vegetables, meat fish, poultry, eggs, bread, salads (certain  forms may   Contain sodium Pepper-Meat fish, poultry, vegetables, eggs Peppermint Extract-Desserts, baked goods Poppy Seed-Eggs, bread, cheese, fruit dressings, baked goods, noodles, vegetables, cottage  [employer], poultry, meat, fish, cauliflower, turnips,eggs bread Saffron-Rice, bread, veal, chicken, fish, eggs Sage-Meat, fish, poultry, onions, eggplant, tomateos, pork, stews Savory-Eggs, salads, poultry, meat, rice, vegetables, soups, pork Tarragon-Meat, poultry, fish, eggs, butter, vegetables (licorice-like flavor)  Thyme-Meat, poultry, fish, eggs, vegetables, (clover-like flavor), sauces, soups Tumeric-Salads, butter, eggs, fish, rice, vegetables (saffron-like flavor) Vanilla Extract-Baked goods, candy Vinegar-Salads, vegetables, meat marinades Walnut Extract-baked goods, candy  2. Choose your Foods Wisely   The following is a list of foods to avoid which are high in sodium:  Meats-Avoid all smoked, canned, salt cured, dried and kosher meat and fish as well as Anchovies   [employer] meats:Bologna, Liverwurst, Pastrami Canned meat or fish  Marinated herring Caviar    Pepperoni Corned Beef   Pizza Dried chipped beef  Salami Frozen breaded fish or meat Salt pork Frankfurters or hot dogs  Sardines Gefilte fish   Sausage Ham (boiled ham, Proscuitto Smoked butt    spiced ham)   Spam      TV Dinners Vegetables Canned vegetables (Regular) Relish Canned mushrooms  Sauerkraut Olives    Tomato juice Pickles  Bakery and Dessert Products Canned puddings  Cream pies Cheesecake   Decorated cakes Cookies  Beverages/Juices Tomato juice, regular  Gatorade   V-8 vegetable juice, regular  Breads and Cereals Biscuit mixes   Salted potato chips, corn chips, pretzels Bread stuffing mixes  Salted crackers and rolls Pancake and waffle mixes Self-rising flour  Seasonings Accent    Meat sauces Barbecue sauce  Meat  tenderizer Catsup    Monosodium glutamate (MSG) Celery salt   Onion salt Chili sauce   Prepared mustard Garlic salt   Salt, seasoned salt, sea salt Gravy mixes   Soy sauce Horseradish   Steak sauce Ketchup   Tartar sauce Lite salt    Teriyaki sauce Marinade mixes   Worcestershire sauce  Others Baking powder   Cocoa and cocoa mixes Baking soda   Commercial casserole mixes Candy-caramels, chocolate  Dehydrated soups    Bars, fudge,nougats  Instant rice and pasta mixes Canned broth or soup  Maraschino cherries Cheese, aged and processed cheese and cheese spreads  Learning Assessment Quiz  Indicated T (for True) or F (for False) for each of the following statements:  1. _____ Fresh fruits and vegetables and unprocessed grains are generally low in sodium 2. _____ Water may contain a considerable amount of sodium, depending on the source 3. _____ You can always tell if a food is high in sodium by tasting it 4. _____ Certain laxatives my be high in sodium and should be avoided unless prescribed   by a physician or pharmacist 5. _____ Salt substitutes may be used freely by anyone on a sodium restricted diet 6. _____ Sodium is present in table salt, food additives and as a natural component of   most foods 7. _____ Table salt is approximately 90% sodium 8. _____ Limiting sodium intake may help prevent excess fluid accumulation in the body 9. _____ On a sodium-restricted diet, seasonings such as bouillon soy sauce, and    cooking wine should be used in place of table salt 10. _____ On an ingredient list, a product which lists monosodium glutamate as the first   ingredient is an appropriate food to include on a low sodium diet  Circle the best answer(s) to the following statements (Hint: there may be more than one correct answer)  11. On a low-sodium diet, some acceptable snack items are:    A. Olives  F. Bean dip   K. Grapefruit juice    B. Salted Pretzels G. Commercial Popcorn   L.  Canned peaches    C. Carrot Sticks  H. Bouillon   M. Unsalted nuts   D. [place] fries  I. Peanut butter crackers N. Salami   E. Sweet pickles J. Tomato Juice   O. Pizza  12.  Seasonings that may be used freely on a reduced - sodium diet include   A. Lemon wedges F.Monosodium glutamate K. Celery seed    B.Soysauce   G. Pepper   L. Mustard powder   C. Sea salt  H. Cooking wine  M. Onion flakes   D. Vinegar  E. Prepared horseradish N. Salsa   E. Sage   J. Worcestershire sauce  O. Chutney

## 2020-05-01 ENCOUNTER — Other Ambulatory Visit: Payer: Self-pay | Admitting: *Deleted

## 2020-05-08 ENCOUNTER — Other Ambulatory Visit: Payer: Self-pay | Admitting: Pulmonary Disease

## 2020-05-11 ENCOUNTER — Ambulatory Visit: Payer: TRICARE For Life (TFL) | Admitting: Podiatry

## 2020-05-29 ENCOUNTER — Other Ambulatory Visit: Payer: Self-pay | Admitting: *Deleted

## 2020-05-29 NOTE — Patient Outreach (Signed)
[facility]) Care Management  [place]  05/29/2020   [name] [name] [birth year] [id number]   RN Health Coach Initial Assessment  Referral Date:  04/28/2020 Referral Source:  Transfer from [place] Reason for Referral:  Continued Disease Management Education Insurance:  Medicare   Outreach Attempt:  Outreach attempt #1 to patient for introduction and initial telephone assessment. No answer. RN Health Coach left voicemail message along with contact information.  Plan:  RN Health Coach will make another outreach attempt within the month of December if no return call back from patient.   [place]lin Coach [phone] [email or web address]

## 2020-06-03 ENCOUNTER — Other Ambulatory Visit: Payer: Self-pay

## 2020-06-03 ENCOUNTER — Encounter: Payer: Self-pay | Admitting: Pulmonary Disease

## 2020-06-03 ENCOUNTER — Ambulatory Visit (INDEPENDENT_AMBULATORY_CARE_PROVIDER_SITE_OTHER): Payer: Medicare Other | Admitting: Pulmonary Disease

## 2020-06-03 VITALS — BP 116/64 | HR 87 | Temp 97.4°F | Ht 64.5 in | Wt 103.8 lb

## 2020-06-03 DIAGNOSIS — R0600 Dyspnea, unspecified: Secondary | ICD-10-CM | POA: Diagnosis not present

## 2020-06-03 DIAGNOSIS — I2510 Atherosclerotic heart disease of native coronary artery without angina pectoris: Secondary | ICD-10-CM

## 2020-06-03 DIAGNOSIS — J849 Interstitial pulmonary disease, unspecified: Secondary | ICD-10-CM | POA: Diagnosis not present

## 2020-06-03 DIAGNOSIS — Z23 Encounter for immunization: Secondary | ICD-10-CM

## 2020-06-03 DIAGNOSIS — M069 Rheumatoid arthritis, unspecified: Secondary | ICD-10-CM

## 2020-06-03 LAB — COMPREHENSIVE METABOLIC PANEL
ALT: 16 U/L (ref 0–35)
AST: 24 U/L (ref 0–37)
Albumin: 3.8 g/dL (ref 3.5–5.2)
Alkaline Phosphatase: 78 U/L (ref 39–117)
BUN: 14 mg/dL (ref 6–23)
CO2: 26 mEq/L (ref 19–32)
Calcium: 9.3 mg/dL (ref 8.4–10.5)
Chloride: 102 mEq/L (ref 96–112)
Creatinine, Ser: 0.7 mg/dL (ref 0.40–1.20)
GFR: 77.63 mL/min (ref >60.00)
Glucose, Bld: 83 mg/dL (ref 70–99)
Potassium: 4.1 mEq/L (ref 3.5–5.1)
Sodium: 142 mEq/L (ref 135–145)
Total Bilirubin: 1.2 mg/dL (ref 0.2–1.2)
Total Protein: 6.9 g/dL (ref 6.0–8.3)

## 2020-06-03 NOTE — Telephone Encounter (Signed)
Error

## 2020-06-03 NOTE — Patient Instructions (Signed)
I am glad you are doing well today We will check comprehensive metabolic panel and proBNP today Schedule high-res CT and limited spirometry, diffusion capacity in 3 months Follow-up in 3 months after these tests.

## 2020-06-03 NOTE — Addendum Note (Signed)
Addended by: Elton Sin on: 06/03/2020 09:58 AM   Modules accepted: Orders

## 2020-06-03 NOTE — Addendum Note (Signed)
Addended by: Suzzanne Cloud E on: 06/03/2020 10:01 AM   Modules accepted: Orders

## 2020-06-03 NOTE — Addendum Note (Signed)
Addended by: Elton Sin on: 06/03/2020 10:42 AM   Modules accepted: Orders

## 2020-06-03 NOTE — Addendum Note (Signed)
Addended by: Elton Sin on: 06/03/2020 09:56 AM   Modules accepted: Orders

## 2020-06-03 NOTE — Addendum Note (Signed)
Addended by: Elton Sin on: 06/03/2020 09:45 AM   Modules accepted: Orders

## 2020-06-03 NOTE — Progress Notes (Signed)
[name] [name]    [id number]    17-Nov-1930  Primary Care Physician:Crawford, Real Cons, MD  Referring Physician: Hoyt Koch, MD [address] [place],  [place] [address]  Chief complaint: Follow-up for RA ILD  HPI: 86 year old with RA ILD, UIP fibrosis, chronic hypoxic respiratory failure on oxygen, DVT, PE Former patient of Dr. Lake Bells.  Pulmonary fibrosis diagnosed in 2003.    She was previously evaluated at [facility] and [facility].  Being managed by Dr. Trudie Reed for rheumatoid arthritis She was previously on infliximab, leflunomide and Imuran, which she did not tolerate due to severe GI symptoms.  Changed to CellCept in later part of 2019 Also has history of recurrent PE, DVT.  She has been difficult antiplatelet due to frequent falls.  Anticoagulation was stopped in January 2018 and she gets Lovenox to use when she does long distance travel. Started Ofev around March 2021 Hospitalized in July 2021 for dizziness, fall.  It is unclear to secondary to the Ofev however the medication was held  Smoking history: Former smoker.  Quit in 1975.  10-pack-year smoking history  Interim history: States that her breathing is stable with no issues.  She has chronic DOE. Complains of lower extremity edema which is unchanged  Received note from Dr. Trudie Reed, rheumatology dated 04/18/2020 Patient continues on Remicade, mycophenolate and low-dose prednisone with good control of symptoms  Outpatient Encounter Medications as of 06/03/2020  Medication Sig  . aspirin EC 81 MG tablet Take 1 tablet (81 mg total) by mouth daily.  [name] atorvastatin (LIPITOR) 40 MG tablet Take 1 tablet (40 mg total) by mouth daily.  . dapsone 100 MG tablet TAKE 1 TABLET(100 MG) BY MOUTH DAILY (Patient taking differently: Take 100 mg by mouth daily. )  . fluticasone (FLONASE) 50 MCG/ACT nasal spray Place 2 sprays into both nostrils daily as needed for allergies or rhinitis.  . furosemide  (LASIX) 20 MG tablet Take 1 tablet by mouth daily as needed for swelling. (Patient taking differently: Take 20 mg by mouth daily as needed for fluid or edema. )  . InFLIXimab (REMICADE IV) Inject 5 mg into the vein every 14 (fourteen) days.   [name] levalbuterol (XOPENEX HFA) 45 MCG/ACT inhaler Inhale 2 puffs into the lungs every 6 (six) hours as needed for wheezing.  . metoprolol succinate (TOPROL XL) 25 MG 24 hr tablet Take 1 tablet (25 mg total) by mouth daily.  . mirtazapine (REMERON) 15 MG tablet Take 1 tablet (15 mg total) by mouth at bedtime.  . mycophenolate (CELLCEPT) 500 MG tablet 2 tabs bid  . Nintedanib (OFEV) 100 MG CAPS Take 1 capsule (100 mg total) by mouth 2 (two) times daily.  . NONFORMULARY OR COMPOUNDED ITEM Antifungal solution: Terbinafine 3%, Fluconazole 2%, Tea Tree Oil 5%, Urea 10%, Ibuprofen 2% in DMSO suspension #23mL  . nystatin cream (MYCOSTATIN) Apply topically.  . nystatin-triamcinolone (MYCOLOG II) cream Apply 1 application topically 2 (two) times daily.  [name] Glycol-Propyl Glycol (SYSTANE) 0.4-0.3 % SOLN Apply 1 drop to eye daily as needed (for dry eyes).  . polyethylene glycol powder (GLYCOLAX/MIRALAX) powder Take 17 g by mouth 2 (two) times daily as needed. (Patient taking differently: Take 17 g by mouth 2 (two) times daily as needed for mild constipation. )  . predniSONE (DELTASONE) 5 MG tablet TAKE 1 TABLET DAILY WITH BREAKFAST  . triamcinolone cream (KENALOG) 0.1 % Apply 1 application topically 2 (two) times daily.  [name]  nitroGLYCERIN (NITROSTAT) 0.4 MG SL tablet Place 1 tablet (0.4 mg total) under the tongue every 5 (five) minutes as needed for chest pain.   No facility-administered encounter medications on file as of 06/03/2020.   Physical Exam: Blood pressure 116/64, pulse 87, temperature (!) 97.4 F (36.3 C), temperature source Skin, height 5' 4.5" (1.638 m), weight 103 lb 12.8 oz (47.1 kg), SpO2 97 %. Gen:      No acute distress HEENT:  EOMI, sclera  anicteric Neck:     No masses; no thyromegaly Lungs:    Bilateral crackles CV:         Regular rate and rhythm; no murmurs Abd:      + bowel sounds; soft, non-tender; no palpable masses, no distension Ext:    No edema; adequate peripheral perfusion Skin:      Warm and dry; no rash Neuro: alert and oriented x 3 Psych: normal mood and affect  Data Reviewed: Imaging: High-res CT 10/26/2017-UIP pattern pulmonary fibrosis.  Stable compared to 2017.   High-res CT 05/30/2019-UIP pattern fibrosis.  Stable.  I have reviewed the images personally.  PFTs: 10/13/2017 FVC 1.67 [86%], FEV1 1.36 [89%], F/F 81, TLC 3.64 [70%], DLCO 6.17 [24%] Mild restriction and severe diffusion defect  07/25/2019 FVC 1.63 [85%], FEV1 1.32 [89%], F/F 81, TLC 3.74 [71%), DLCO 5.83 [30%] Mild restriction, severe diffusion  6 min walk July 2017 6 minute walk distance 324 m, O2 saturation nadir 93% on room air 09/06/2019 -29 m, nadir O2 sat of 82%  Cardiac: Echocardiogram 01/26/2020-LVEF 55-60%, normal RV size and function.  Normal PA systolic pressure.  Assessment:  RA ILD with UIP fibrosis CT scan reviewed with UIP fibrosis.   Continue mycophenolate, Remicade, prednisone per rheumatology She is on dapsone for pneumocystis prophylaxis for unclear reason.  She had been on Bactrim in the past Since she is tolerating dapsone okay we will continue it for now.  Stable on CT scan dating back to 2014. But with clear progression compared to 2009. And started on Ofev in early 2021.  She is tolerating the lower dose of 100 mg well and I think this is appropriate dose for her given low body weight and frailty Check CMP today  Lower extremity edema Has history of diastolic heart failure Continue Lasix Echo reviewed with no pulmonary hypertension Check proBNP for monitoring  History of DVT, PE Not on chronic anticoagulation due to concern for fall She uses Lovenox while traveling.  Health maintenance Has received  booster vaccine   Plan/Recommendations: - Continue CellCept, dapsone - Continue Ofev, check CMP, proBNP - Continue Lasix  Marshell Garfinkel MD [facility] 06/03/2020, 9:23 AM  CC: Hoyt Koch, *

## 2020-06-04 LAB — PRO B NATRIURETIC PEPTIDE: NT-Pro BNP: 100 pg/mL (ref 0–738)

## 2020-06-09 DIAGNOSIS — Z961 Presence of intraocular lens: Secondary | ICD-10-CM | POA: Diagnosis not present

## 2020-06-09 DIAGNOSIS — H5213 Myopia, bilateral: Secondary | ICD-10-CM | POA: Diagnosis not present

## 2020-06-09 DIAGNOSIS — H40013 Open angle with borderline findings, low risk, bilateral: Secondary | ICD-10-CM | POA: Diagnosis not present

## 2020-06-12 ENCOUNTER — Other Ambulatory Visit: Payer: Self-pay | Admitting: *Deleted

## 2020-06-12 NOTE — Patient Outreach (Signed)
[facility]) Care Management  [place]  06/12/2020   [name] [name] 03-25-1931 [id number]   RN Health Coach Initial Assessment  Referral Date:  04/28/2020 Referral Source:  Transfer from [place] Reason for Referral:  Continued Disease Management Education Insurance:  Medicare    Outreach Attempt:  Outreach attempt #2 to patient for introduction and initial telephone assessment.  HIPAA verified with patient.  RN Health Coach introduced self and role.  Patient agrees to Disease Management outreaches.  States she is unable to complete initial assessment today.  Plan:  RN Health Coach will make another outreach to patient within the month of December per patients request.   Hubert Azure RN [place] [phone] [email or web address]

## 2020-06-16 ENCOUNTER — Encounter: Payer: Self-pay | Admitting: *Deleted

## 2020-06-16 ENCOUNTER — Other Ambulatory Visit: Payer: Self-pay | Admitting: *Deleted

## 2020-06-16 NOTE — Patient Outreach (Signed)
[place]) Care Management  [place]  06/16/2020   JAZARIA JARECKI 12-20-1930 [id number]   RN Health Coach Initial Assessment  Referral Date:04/28/2020 Referral Source:Transfer from [place] Reason for Referral:Continued Disease Management Education Insurance:Medicare   Outreach Attempt:  Outreach attempt #3 to patient for initial telephone assessment. No answer. RN Health Coach left voicemail message along with contact information.  Plan:  RN Health Coach will make another outreach attempt within the month of January if no return call back from patient.   [place] [phone] [email or web address]

## 2020-06-18 DIAGNOSIS — M0579 Rheumatoid arthritis with rheumatoid factor of multiple sites without organ or systems involvement: Secondary | ICD-10-CM | POA: Diagnosis not present

## 2020-06-18 DIAGNOSIS — Z79899 Other long term (current) drug therapy: Secondary | ICD-10-CM | POA: Diagnosis not present

## 2020-06-29 ENCOUNTER — Telehealth: Payer: Self-pay | Admitting: Cardiovascular Disease

## 2020-06-29 MED ORDER — NITROGLYCERIN 0.4 MG SL SUBL: 0.4000 mg | SUBLINGUAL_TABLET | SUBLINGUAL | 7 refills | Status: DC | PRN

## 2020-06-29 NOTE — Telephone Encounter (Signed)
New Message  *STAT* If patient is at the pharmacy, call can be transferred to refill team.  1. Which medications need to be refilled? (please list name of each medication and dose if known) nitroGLYCERIN (NITROSTAT) 0.4 MG SL tablet(Expired)  2. Which pharmacy/location (including street and city if local pharmacy) is medication to be sent to? [place], [place] - [address] AT [place]  3. Do they need a 30 day or 90 day supply? 30 or 90   Pt says she was going to use medication last night and realized it was expired

## 2020-06-29 NOTE — Telephone Encounter (Signed)
Pt's medication was sent to pt's pharmacy as requested. Confirmation received.  °

## 2020-07-05 ENCOUNTER — Encounter: Payer: Self-pay | Admitting: Pulmonary Disease

## 2020-07-05 NOTE — Progress Notes (Unsigned)
Received fax with lab values from [facility] rheumatology dated 10/18/2019 AST elevated at 63  Please order follow-up CMP and follow-up in clinic to reevaluate as she is on Ofev.  Marshell Garfinkel MD [facility] 07/05/2020, 3:20 PM

## 2020-07-06 ENCOUNTER — Other Ambulatory Visit: Payer: Self-pay

## 2020-07-06 DIAGNOSIS — Z5181 Encounter for therapeutic drug level monitoring: Secondary | ICD-10-CM

## 2020-07-06 NOTE — Progress Notes (Unsigned)
Office visit scheduled, and labs ordered.  Nothing further needed at this time- will close encounter.

## 2020-07-14 ENCOUNTER — Ambulatory Visit: Payer: Medicare Other | Admitting: Primary Care

## 2020-07-17 ENCOUNTER — Ambulatory Visit: Payer: TRICARE For Life (TFL) | Admitting: Podiatry

## 2020-07-17 DIAGNOSIS — S00201A Unspecified superficial injury of right eyelid and periocular area, initial encounter: Secondary | ICD-10-CM | POA: Diagnosis not present

## 2020-07-20 ENCOUNTER — Other Ambulatory Visit (INDEPENDENT_AMBULATORY_CARE_PROVIDER_SITE_OTHER): Payer: Medicare Other

## 2020-07-20 DIAGNOSIS — Z5181 Encounter for therapeutic drug level monitoring: Secondary | ICD-10-CM

## 2020-07-20 LAB — COMPREHENSIVE METABOLIC PANEL
ALT: 20 U/L (ref 0–35)
AST: 29 U/L (ref 0–37)
Albumin: 3.8 g/dL (ref 3.5–5.2)
Alkaline Phosphatase: 83 U/L (ref 39–117)
BUN: 11 mg/dL (ref 6–23)
CO2: 31 mEq/L (ref 19–32)
Calcium: 9.2 mg/dL (ref 8.4–10.5)
Chloride: 103 mEq/L (ref 96–112)
Creatinine, Ser: 0.66 mg/dL (ref 0.40–1.20)
GFR: 78.67 mL/min (ref >60.00)
Glucose, Bld: 89 mg/dL (ref 70–99)
Potassium: 3.7 mEq/L (ref 3.5–5.1)
Sodium: 139 mEq/L (ref 135–145)
Total Bilirubin: 0.9 mg/dL (ref 0.2–1.2)
Total Protein: 7.1 g/dL (ref 6.0–8.3)

## 2020-07-21 ENCOUNTER — Ambulatory Visit (INDEPENDENT_AMBULATORY_CARE_PROVIDER_SITE_OTHER): Payer: Medicare Other | Admitting: Primary Care

## 2020-07-21 ENCOUNTER — Ambulatory Visit: Payer: Medicare Other | Admitting: Primary Care

## 2020-07-21 ENCOUNTER — Other Ambulatory Visit: Payer: Self-pay

## 2020-07-21 ENCOUNTER — Encounter: Payer: Self-pay | Admitting: Primary Care

## 2020-07-21 ENCOUNTER — Telehealth: Payer: Self-pay | Admitting: Pulmonary Disease

## 2020-07-21 DIAGNOSIS — I5032 Chronic diastolic (congestive) heart failure: Secondary | ICD-10-CM

## 2020-07-21 DIAGNOSIS — J8410 Pulmonary fibrosis, unspecified: Secondary | ICD-10-CM

## 2020-07-21 DIAGNOSIS — M069 Rheumatoid arthritis, unspecified: Secondary | ICD-10-CM

## 2020-07-21 NOTE — Progress Notes (Signed)
@Patient  ID: [name] [name], female    DOB: 1931-01-31, 87 y.o.   MRN: [id number]  Chief Complaint  Patient presents with  . Follow-up    Doing ok, feels breathing is stable    Referring provider: Hoyt Koch, *  HPI: 87 year old female, former smoker quit 1975 (10-pack-year history). Past medical history significant for RA-ILD, UIP fibrosis, chronic hypoxic respiratory failure on oxygen, DVT/PE. Patient of Dr. Vaughan Browner, last seen in office on 06/03/2020.   Previous [facility] pulmonary encounter: Pulmonary fibrosis diagnosed in 2003. She was previously evaluated at [facility] and [facility].  Being managed by Dr. Trudie Reed for rheumatoid arthritis She was previously on infliximab, leflunomide and Imuran, which she did not tolerate due to severe GI symptoms.  Changed to CellCept in later part of 2019 Also has history of recurrent PE, DVT.  She has been difficult antiplatelet due to frequent falls.  Anticoagulation was stopped in January 2018 and she gets Lovenox to use when she does long distance travel. Started Ofev around March 2021 Hospitalized in July 2021 for dizziness, fall.  It is unclear to secondary to the Ofev however the medication was held  Smoking history: Former smoker.  Quit in 1975.  10-pack-year smoking history  06/03/20- Dr. Vaughan Browner visit  States that her breathing is stable with no issues.  She has chronic DOE. Complains of lower extremity edema which is unchanged  Received note from Dr. Trudie Reed, rheumatology dated 04/18/2020 Patient continues on Remicade, mycophenolate and low-dose prednisone with good control of symptoms  07/21/2020- Interim hx  Patient presents today for 6 week follow-up with labs. RA-ILD with UIP fibrosis appears stable dating back to 2014 but with clear progression form 2009. She was started on Ofev in early 2021 and tolerating 100mg . She is maintained on Mycophenolate, Remicade and prednisone per rheumatology. She is also on Dapsone for  pneumocystis prophylaxis for unclear reason, since tolerating ok to continue for now. Orders for CMP and proBNP labs.    Accompanied by family friend. She is doing very well, no acute complaints. She is compliant with medication regimen. She wears 2L oxygen continuously, no additional requirements. Tolerating ofev 100mg  twice daily, sometimes finds it hard to get the second dose in d/t timing. Leg swelling is minimal, she takes lasix 20mg  once a week as needed.   Data Reviewed: Imaging: High-res CT 10/26/2017-UIP pattern pulmonary fibrosis.  Stable compared to 2017.   High-res CT 05/30/2019-UIP pattern fibrosis.  Stable.  I have reviewed the images personally.  PFTs: 10/13/2017 FVC 1.67 [86%], FEV1 1.36 [89%], F/F 81, TLC 3.64 [70%], DLCO 6.17 [24%] Mild restriction and severe diffusion defect  07/25/2019 FVC 1.63 [85%], FEV1 1.32 [89%], F/F 81, TLC 3.74 [71%), DLCO 5.83 [30%] Mild restriction, severe diffusion  6 min walk July 2017 6 minute walk distance 324 m, O2 saturation nadir 93% on room air 09/06/2019 -69 m, nadir O2 sat of 82%  Cardiac: Echocardiogram 01/26/2020-LVEF 55-60%, normal RV size and function.  Normal PA systolic pressure.    Allergies  Allergen Reactions  . Crestor [Rosuvastatin] Other (See Comments)    Muscle pain  . Lactose Intolerance (Gi) Other (See Comments)    Stomach upset  . Penicillins Hives  . Imuran [Azathioprine] Nausea And Vomiting    Immunization History  Administered Date(s) Administered  . Fluad Quad(high Dose 65+) 06/03/2020  . Influenza Split 05/09/2011  . Influenza Whole 04/10/2010, 04/10/2012  . Influenza, High Dose Seasonal PF 05/07/2017, 04/23/2018, 03/24/2019  . Influenza,inj,Quad PF,6+  Mos 07/25/2013, 04/29/2014, 04/16/2015  . Influenza-Unspecified 04/24/2016  . Moderna Sars-Covid-2 Vaccination 07/22/2019, 08/19/2019, 03/03/2020  . Pneumococcal Conjugate-13 04/16/2015  . Pneumococcal Polysaccharide-23 07/26/2017  . Tdap  11/12/2013  . Zoster 07/11/2013    Past Medical History:  Diagnosis Date  . Allergic rhinitis   . Allergy   . CAD (coronary artery disease)    Mild CAD by cath 2008  . DJD (degenerative joint disease)    rheumatoid  . DVT (deep venous thrombosis) ([place])   . Dyslipidemia   . History of echocardiogram    Echo 12/2018: EF 60-65, mod asymmetric LVH, Gr 1 DD  . History of nuclear stress test    Myoview 5/17:  EF 74%, normal perfusion, low risk // Myoview 12/2018:  EF 89, very mild ischemia in inferoapical wall; Low Risk  . History of pulmonary embolism   . Hyperlipidemia   . Insomnia   . Osteoporosis   . Psoriasis   . Pulmonary fibrosis ([place])   . Rheumatoid arthritis ([place])     Tobacco History: Social History   Tobacco Use  Smoking Status Former Smoker  . Packs/day: 0.50  . Years: 20.00  . Pack years: 10.00  . Types: Cigarettes  . Quit date: 07/11/1973  . Years since quitting: 47.0  Smokeless Tobacco Never Used   Counseling given: Not Answered   Outpatient Medications Prior to Visit  Medication Sig Dispense Refill  . aspirin EC 81 MG tablet Take 1 tablet (81 mg total) by mouth daily.    [name] atorvastatin (LIPITOR) 40 MG tablet Take 1 tablet (40 mg total) by mouth daily. 90 tablet 0  . dapsone 100 MG tablet TAKE 1 TABLET(100 MG) BY MOUTH DAILY (Patient taking differently: Take 100 mg by mouth daily.) 90 tablet 1  . fluticasone (FLONASE) 50 MCG/ACT nasal spray Place 2 sprays into both nostrils daily as needed for allergies or rhinitis. 16 g 3  . furosemide (LASIX) 20 MG tablet Take 1 tablet by mouth daily as needed for swelling. (Patient taking differently: Take 20 mg by mouth daily as needed for fluid or edema.) 30 tablet 11  . InFLIXimab (REMICADE IV) Inject 5 mg into the vein every 14 (fourteen) days.     [name] levalbuterol (XOPENEX HFA) 45 MCG/ACT inhaler Inhale 2 puffs into the lungs every 6 (six) hours as needed for wheezing. 1 Inhaler 12  . metoprolol succinate (TOPROL XL) 25  MG 24 hr tablet Take 1 tablet (25 mg total) by mouth daily. 30 tablet 0  . mirtazapine (REMERON) 15 MG tablet Take 1 tablet (15 mg total) by mouth at bedtime. 90 tablet 3  . mycophenolate (CELLCEPT) 500 MG tablet 2 tabs bid 360 tablet 3  . Nintedanib (OFEV) 100 MG CAPS Take 1 capsule (100 mg total) by mouth 2 (two) times daily. 60 capsule 3  . nitroGLYCERIN (NITROSTAT) 0.4 MG SL tablet Place 1 tablet (0.4 mg total) under the tongue every 5 (five) minutes as needed for chest pain. 25 tablet 7  . NONFORMULARY OR COMPOUNDED ITEM Antifungal solution: Terbinafine 3%, Fluconazole 2%, Tea Tree Oil 5%, Urea 10%, Ibuprofen 2% in DMSO suspension #77mL 1 each 3  . nystatin cream (MYCOSTATIN) Apply topically.    . nystatin-triamcinolone (MYCOLOG II) cream Apply 1 application topically 2 (two) times daily. 30 g 0  . Polyethyl Glycol-Propyl Glycol 0.4-0.3 % SOLN Apply 1 drop to eye daily as needed (for dry eyes).    . polyethylene glycol powder (GLYCOLAX/MIRALAX) powder Take 17 g by mouth  2 (two) times daily as needed. (Patient taking differently: Take 17 g by mouth 2 (two) times daily as needed for mild constipation.) 3350 g 1  . predniSONE (DELTASONE) 5 MG tablet TAKE 1 TABLET DAILY WITH BREAKFAST 90 tablet 3  . triamcinolone cream (KENALOG) 0.1 % Apply 1 application topically 2 (two) times daily. 453.6 g 0   No facility-administered medications prior to visit.    Review of Systems  Review of Systems  Constitutional: Negative.   Respiratory: Negative for cough, shortness of breath and wheezing.   Cardiovascular: Negative.    Physical Exam  BP 118/72 (BP Location: Left Arm, Cuff Size: Normal)   Pulse 87   Temp (!) 97.3 F (36.3 C) (Oral)   Ht 5\' 4"  (1.626 m)   Wt 104 lb (47.2 kg)   SpO2 93%   BMI 17.85 kg/m  Physical Exam Constitutional:      Appearance: Normal appearance.  HENT:     Head: Normocephalic and atraumatic.     Mouth/Throat:     Comments: Deferred d/t masking Cardiovascular:      Rate and Rhythm: Normal rate and regular rhythm.  Pulmonary:     Effort: Pulmonary effort is normal.     Breath sounds: Rales present.  Musculoskeletal:     Cervical back: Normal range of motion and neck supple.  Skin:    General: Skin is warm and dry.  Neurological:     General: No focal deficit present.     Mental Status: She is alert and oriented to person, place, and time. Mental status is at baseline.  Psychiatric:        Mood and Affect: Mood normal.        Behavior: Behavior normal.        Thought Content: Thought content normal.        Judgment: Judgment normal.      Lab Results:  CBC    Component Value Date/Time   WBC 7.7 04/29/2020 0948   WBC 11.6 (H) 02/12/2020 1944   RBC 3.47 (L) 04/29/2020 0948   RBC 3.22 (L) 02/12/2020 1944   HGB 10.9 (L) 04/29/2020 0948   HCT 32.8 (L) 04/29/2020 0948   PLT 229 04/29/2020 0948   MCV 95 04/29/2020 0948   MCH 31.4 04/29/2020 0948   MCH 31.1 02/12/2020 1944   MCHC 33.2 04/29/2020 0948   MCHC 31.0 02/12/2020 1944   RDW 12.6 04/29/2020 0948   LYMPHSABS 4.2 (H) 02/12/2020 1944   LYMPHSABS 3.7 (H) 04/27/2018 1306   MONOABS 1.1 (H) 02/12/2020 1944   EOSABS 0.2 02/12/2020 1944   EOSABS 0.1 04/27/2018 1306   BASOSABS 0.1 02/12/2020 1944   BASOSABS 0.0 04/27/2018 1306    BMET    Component Value Date/Time   NA 139 07/20/2020 1415   NA 138 04/29/2020 0948   K 3.7 07/20/2020 1415   CL 103 07/20/2020 1415   CO2 31 07/20/2020 1415   GLUCOSE 89 07/20/2020 1415   BUN 11 07/20/2020 1415   BUN 11 04/29/2020 0948   CREATININE 0.66 07/20/2020 1415   CREATININE 0.74 11/30/2015 1327   CALCIUM 9.2 07/20/2020 1415   GFRNONAA 60 04/29/2020 0948   GFRAA 69 04/29/2020 0948    BNP    Component Value Date/Time   BNP 62.2 02/12/2020 1944    ProBNP    Component Value Date/Time   PROBNP 100 06/03/2020 1001   PROBNP 55.0 10/13/2017 1653    Imaging: No results found.   Assessment & Plan:  Postinflammatory pulmonary  fibrosis / RA ILD  - Dx in 2003. Imaging appears stable since 2014 but clear progression from 2009 - Continues on Ofev 100mg  twice daily (recommend she stay on this does d/t fragility) - CMET today and 06/03/20 was normal  - Needs spirometry with DLCO in 3 months and repeat liver function testing in 6 weeks - FU in 3 months with Dr. Vaughan Browner   Chronic diastolic CHF (congestive heart failure) (HCC) - Chronic LE edema  - Continue lasix 20mg  as needed, recommend she takes MWF  - BNP on 06/03/20 was 100 (normal)  RA (rheumatoid arthritis) ([place]) - Follows with Dr. Hawkes/rheumatology - Maintained on Cellcept, Remicade, dapsone and prednisone 5mg  daily - Intolerant to Imuran in the past      Martyn Ehrich, NP 07/21/2020

## 2020-07-21 NOTE — Assessment & Plan Note (Addendum)
-   Follows with Dr. Hawkes/rheumatology - Maintained on Cellcept, Remicade, dapsone and prednisone 5mg  daily - Intolerant to Imuran in the past

## 2020-07-21 NOTE — Telephone Encounter (Signed)
Called and spoke with Patient.  Patient stated she is scheduled with Beth,NP at 1430 today to discuss lab works.  Patient is requesting to discuss labs over phone vs. coming out to  office in cold and sickness.  Message routed to Beth,NP to advise

## 2020-07-21 NOTE — Assessment & Plan Note (Addendum)
-   Chronic LE edema  - Continue lasix 20mg  as needed, recommend she takes MWF  - BNP on 06/03/20 was 100 (normal)

## 2020-07-21 NOTE — Telephone Encounter (Signed)
That's fine

## 2020-07-21 NOTE — Telephone Encounter (Signed)
Patient was not called in time for this to be switched to Televisit. Patient is currently here in the office right now for visit to see Beth. Nothing further needed at this time.

## 2020-07-21 NOTE — Assessment & Plan Note (Addendum)
-   Dx in 2003. Imaging appears stable since 2014 but clear progression from 2009 - Continues on Ofev 100mg  twice daily (recommend she stay on this does d/t fragility) - CMET today and 06/03/20 was normal  - Needs spirometry with DLCO in 3 months and repeat liver function testing in 6 weeks - FU in 3 months with Dr. Vaughan Browner

## 2020-07-21 NOTE — Patient Instructions (Addendum)
  Testing results:  - BNP on 06/03/20 was normal  - CMET on 06/03/20 was normal - CMET 07/21/2020 was normal   Rheumatoid arthritis:  - Continue Cellcept, Remicade, dapsone and prednisone 5mg  daily - Continue to follow with Dr. Hawkes/rheumatology  Pulmonary fibrosis: - Continue Ofev 100mg  twice a day  Diastolic heart failure: - Continue lasix 20mg  daily   Orders: - Liver function lab test  in 6 weeks (please order) - Spirometry with DLCO in 3 months (please order)  Follow-up: - 3 months with Dr. Vaughan Browner

## 2020-07-23 ENCOUNTER — Other Ambulatory Visit: Payer: Self-pay | Admitting: *Deleted

## 2020-07-23 NOTE — Patient Outreach (Signed)
[facility]) Care Management  07/23/2020  [name] [name] 1931-01-18 [id number]  Unsuccessful outreach attempt made to patient. Patient answered that phone and stated she could not speak today. She did ask that this nurse call her back at a later date.   Plan: RN Health Coach will call patient within the month of February.  [name] Loron RN, BSN [place] [phone] [name]

## 2020-08-12 ENCOUNTER — Ambulatory Visit: Payer: TRICARE For Life (TFL) | Admitting: Podiatry

## 2020-08-14 ENCOUNTER — Encounter: Payer: Self-pay | Admitting: Podiatry

## 2020-08-14 ENCOUNTER — Ambulatory Visit (INDEPENDENT_AMBULATORY_CARE_PROVIDER_SITE_OTHER): Payer: Medicare Other | Admitting: Podiatry

## 2020-08-14 ENCOUNTER — Other Ambulatory Visit: Payer: Self-pay

## 2020-08-14 DIAGNOSIS — B351 Tinea unguium: Secondary | ICD-10-CM | POA: Diagnosis not present

## 2020-08-14 DIAGNOSIS — M79674 Pain in right toe(s): Secondary | ICD-10-CM

## 2020-08-14 DIAGNOSIS — M79675 Pain in left toe(s): Secondary | ICD-10-CM

## 2020-08-17 ENCOUNTER — Other Ambulatory Visit: Payer: Self-pay

## 2020-08-17 ENCOUNTER — Encounter (HOSPITAL_COMMUNITY): Payer: Self-pay | Admitting: Emergency Medicine

## 2020-08-17 ENCOUNTER — Inpatient Hospital Stay (HOSPITAL_COMMUNITY): Payer: Medicare Other

## 2020-08-17 ENCOUNTER — Inpatient Hospital Stay (HOSPITAL_COMMUNITY)
Admission: EM | Admit: 2020-08-17 | Discharge: 2020-09-08 | DRG: 329 | Disposition: A | Discharge status: Skilled Nursing Facility | Payer: Medicare Other | Attending: Internal Medicine | Admitting: Internal Medicine

## 2020-08-17 ENCOUNTER — Emergency Department (HOSPITAL_COMMUNITY): Payer: Medicare Other

## 2020-08-17 DIAGNOSIS — R109 Unspecified abdominal pain: Secondary | ICD-10-CM | POA: Diagnosis not present

## 2020-08-17 DIAGNOSIS — L03311 Cellulitis of abdominal wall: Secondary | ICD-10-CM | POA: Diagnosis not present

## 2020-08-17 DIAGNOSIS — D62 Acute posthemorrhagic anemia: Secondary | ICD-10-CM | POA: Diagnosis not present

## 2020-08-17 DIAGNOSIS — R748 Abnormal levels of other serum enzymes: Secondary | ICD-10-CM | POA: Diagnosis present

## 2020-08-17 DIAGNOSIS — Z9049 Acquired absence of other specified parts of digestive tract: Secondary | ICD-10-CM | POA: Diagnosis not present

## 2020-08-17 DIAGNOSIS — K56699 Other intestinal obstruction unspecified as to partial versus complete obstruction: Secondary | ICD-10-CM | POA: Diagnosis not present

## 2020-08-17 DIAGNOSIS — K828 Other specified diseases of gallbladder: Secondary | ICD-10-CM | POA: Diagnosis not present

## 2020-08-17 DIAGNOSIS — E785 Hyperlipidemia, unspecified: Secondary | ICD-10-CM | POA: Diagnosis not present

## 2020-08-17 DIAGNOSIS — I959 Hypotension, unspecified: Secondary | ICD-10-CM | POA: Diagnosis not present

## 2020-08-17 DIAGNOSIS — K562 Volvulus: Secondary | ICD-10-CM | POA: Diagnosis not present

## 2020-08-17 DIAGNOSIS — Z743 Need for continuous supervision: Secondary | ICD-10-CM | POA: Diagnosis not present

## 2020-08-17 DIAGNOSIS — R945 Abnormal results of liver function studies: Secondary | ICD-10-CM | POA: Diagnosis not present

## 2020-08-17 DIAGNOSIS — E860 Dehydration: Secondary | ICD-10-CM | POA: Diagnosis present

## 2020-08-17 DIAGNOSIS — J9610 Chronic respiratory failure, unspecified whether with hypoxia or hypercapnia: Secondary | ICD-10-CM | POA: Diagnosis not present

## 2020-08-17 DIAGNOSIS — Z888 Allergy status to other drugs, medicaments and biological substances status: Secondary | ICD-10-CM

## 2020-08-17 DIAGNOSIS — Z820 Family history of epilepsy and other diseases of the nervous system: Secondary | ICD-10-CM

## 2020-08-17 DIAGNOSIS — Z681 Body mass index (BMI) 19 or less, adult: Secondary | ICD-10-CM

## 2020-08-17 DIAGNOSIS — J9621 Acute and chronic respiratory failure with hypoxia: Secondary | ICD-10-CM | POA: Diagnosis not present

## 2020-08-17 DIAGNOSIS — T380X5A Adverse effect of glucocorticoids and synthetic analogues, initial encounter: Secondary | ICD-10-CM | POA: Diagnosis not present

## 2020-08-17 DIAGNOSIS — D649 Anemia, unspecified: Secondary | ICD-10-CM | POA: Diagnosis present

## 2020-08-17 DIAGNOSIS — R112 Nausea with vomiting, unspecified: Secondary | ICD-10-CM | POA: Diagnosis present

## 2020-08-17 DIAGNOSIS — K5730 Diverticulosis of large intestine without perforation or abscess without bleeding: Secondary | ICD-10-CM | POA: Diagnosis not present

## 2020-08-17 DIAGNOSIS — L409 Psoriasis, unspecified: Secondary | ICD-10-CM | POA: Diagnosis present

## 2020-08-17 DIAGNOSIS — K5669 Other partial intestinal obstruction: Secondary | ICD-10-CM | POA: Diagnosis not present

## 2020-08-17 DIAGNOSIS — I700 Atherosclerosis of aorta: Secondary | ICD-10-CM | POA: Diagnosis not present

## 2020-08-17 DIAGNOSIS — R1111 Vomiting without nausea: Secondary | ICD-10-CM | POA: Diagnosis not present

## 2020-08-17 DIAGNOSIS — D84821 Immunodeficiency due to drugs: Secondary | ICD-10-CM | POA: Diagnosis present

## 2020-08-17 DIAGNOSIS — Z86718 Personal history of other venous thrombosis and embolism: Secondary | ICD-10-CM | POA: Diagnosis not present

## 2020-08-17 DIAGNOSIS — R9431 Abnormal electrocardiogram [ECG] [EKG]: Secondary | ICD-10-CM | POA: Diagnosis not present

## 2020-08-17 DIAGNOSIS — J95822 Acute and chronic postprocedural respiratory failure: Secondary | ICD-10-CM | POA: Diagnosis not present

## 2020-08-17 DIAGNOSIS — S3991XA Unspecified injury of abdomen, initial encounter: Secondary | ICD-10-CM | POA: Diagnosis not present

## 2020-08-17 DIAGNOSIS — E870 Hyperosmolality and hypernatremia: Secondary | ICD-10-CM | POA: Diagnosis present

## 2020-08-17 DIAGNOSIS — L89152 Pressure ulcer of sacral region, stage 2: Secondary | ICD-10-CM | POA: Diagnosis not present

## 2020-08-17 DIAGNOSIS — I5032 Chronic diastolic (congestive) heart failure: Secondary | ICD-10-CM | POA: Diagnosis not present

## 2020-08-17 DIAGNOSIS — Z20822 Contact with and (suspected) exposure to covid-19: Secondary | ICD-10-CM | POA: Diagnosis not present

## 2020-08-17 DIAGNOSIS — Z4659 Encounter for fitting and adjustment of other gastrointestinal appliance and device: Secondary | ICD-10-CM

## 2020-08-17 DIAGNOSIS — E43 Unspecified severe protein-calorie malnutrition: Secondary | ICD-10-CM | POA: Diagnosis present

## 2020-08-17 DIAGNOSIS — Z96653 Presence of artificial knee joint, bilateral: Secondary | ICD-10-CM | POA: Diagnosis present

## 2020-08-17 DIAGNOSIS — N3000 Acute cystitis without hematuria: Secondary | ICD-10-CM | POA: Diagnosis present

## 2020-08-17 DIAGNOSIS — K563 Gallstone ileus: Secondary | ICD-10-CM | POA: Diagnosis present

## 2020-08-17 DIAGNOSIS — I9581 Postprocedural hypotension: Secondary | ICD-10-CM | POA: Diagnosis present

## 2020-08-17 DIAGNOSIS — R1084 Generalized abdominal pain: Secondary | ICD-10-CM | POA: Diagnosis not present

## 2020-08-17 DIAGNOSIS — B952 Enterococcus as the cause of diseases classified elsewhere: Secondary | ICD-10-CM | POA: Diagnosis present

## 2020-08-17 DIAGNOSIS — Z0189 Encounter for other specified special examinations: Secondary | ICD-10-CM

## 2020-08-17 DIAGNOSIS — G8918 Other acute postprocedural pain: Secondary | ICD-10-CM | POA: Diagnosis not present

## 2020-08-17 DIAGNOSIS — Z7952 Long term (current) use of systemic steroids: Secondary | ICD-10-CM | POA: Diagnosis not present

## 2020-08-17 DIAGNOSIS — K3189 Other diseases of stomach and duodenum: Secondary | ICD-10-CM | POA: Diagnosis present

## 2020-08-17 DIAGNOSIS — J9600 Acute respiratory failure, unspecified whether with hypoxia or hypercapnia: Secondary | ICD-10-CM | POA: Diagnosis not present

## 2020-08-17 DIAGNOSIS — J8410 Pulmonary fibrosis, unspecified: Secondary | ICD-10-CM | POA: Diagnosis present

## 2020-08-17 DIAGNOSIS — I2510 Atherosclerotic heart disease of native coronary artery without angina pectoris: Secondary | ICD-10-CM | POA: Diagnosis present

## 2020-08-17 DIAGNOSIS — Y838 Other surgical procedures as the cause of abnormal reaction of the patient, or of later complication, without mention of misadventure at the time of the procedure: Secondary | ICD-10-CM | POA: Diagnosis not present

## 2020-08-17 DIAGNOSIS — K5710 Diverticulosis of small intestine without perforation or abscess without bleeding: Secondary | ICD-10-CM | POA: Diagnosis not present

## 2020-08-17 DIAGNOSIS — K565 Intestinal adhesions [bands], unspecified as to partial versus complete obstruction: Secondary | ICD-10-CM | POA: Diagnosis present

## 2020-08-17 DIAGNOSIS — K56609 Unspecified intestinal obstruction, unspecified as to partial versus complete obstruction: Secondary | ICD-10-CM | POA: Diagnosis not present

## 2020-08-17 DIAGNOSIS — R8271 Bacteriuria: Secondary | ICD-10-CM | POA: Diagnosis not present

## 2020-08-17 DIAGNOSIS — J849 Interstitial pulmonary disease, unspecified: Secondary | ICD-10-CM | POA: Diagnosis not present

## 2020-08-17 DIAGNOSIS — M0510 Rheumatoid lung disease with rheumatoid arthritis of unspecified site: Secondary | ICD-10-CM | POA: Diagnosis present

## 2020-08-17 DIAGNOSIS — K6389 Other specified diseases of intestine: Secondary | ICD-10-CM | POA: Diagnosis not present

## 2020-08-17 DIAGNOSIS — K5901 Slow transit constipation: Secondary | ICD-10-CM

## 2020-08-17 DIAGNOSIS — Z880 Allergy status to penicillin: Secondary | ICD-10-CM

## 2020-08-17 DIAGNOSIS — K5641 Fecal impaction: Secondary | ICD-10-CM | POA: Diagnosis present

## 2020-08-17 DIAGNOSIS — R54 Age-related physical debility: Secondary | ICD-10-CM | POA: Diagnosis present

## 2020-08-17 DIAGNOSIS — Z9981 Dependence on supplemental oxygen: Secondary | ICD-10-CM

## 2020-08-17 DIAGNOSIS — M6281 Muscle weakness (generalized): Secondary | ICD-10-CM | POA: Diagnosis not present

## 2020-08-17 DIAGNOSIS — D72829 Elevated white blood cell count, unspecified: Secondary | ICD-10-CM | POA: Diagnosis not present

## 2020-08-17 DIAGNOSIS — D509 Iron deficiency anemia, unspecified: Secondary | ICD-10-CM | POA: Diagnosis not present

## 2020-08-17 DIAGNOSIS — M810 Age-related osteoporosis without current pathological fracture: Secondary | ICD-10-CM | POA: Diagnosis present

## 2020-08-17 DIAGNOSIS — E876 Hypokalemia: Secondary | ICD-10-CM | POA: Diagnosis present

## 2020-08-17 DIAGNOSIS — K5750 Diverticulosis of both small and large intestine without perforation or abscess without bleeding: Secondary | ICD-10-CM | POA: Diagnosis present

## 2020-08-17 DIAGNOSIS — E8889 Other specified metabolic disorders: Secondary | ICD-10-CM | POA: Diagnosis not present

## 2020-08-17 DIAGNOSIS — L8990 Pressure ulcer of unspecified site, unspecified stage: Secondary | ICD-10-CM | POA: Insufficient documentation

## 2020-08-17 DIAGNOSIS — Z86711 Personal history of pulmonary embolism: Secondary | ICD-10-CM

## 2020-08-17 DIAGNOSIS — J9601 Acute respiratory failure with hypoxia: Secondary | ICD-10-CM

## 2020-08-17 DIAGNOSIS — Z8249 Family history of ischemic heart disease and other diseases of the circulatory system: Secondary | ICD-10-CM

## 2020-08-17 DIAGNOSIS — M069 Rheumatoid arthritis, unspecified: Secondary | ICD-10-CM | POA: Diagnosis not present

## 2020-08-17 DIAGNOSIS — R5381 Other malaise: Secondary | ICD-10-CM | POA: Diagnosis not present

## 2020-08-17 DIAGNOSIS — Z7982 Long term (current) use of aspirin: Secondary | ICD-10-CM

## 2020-08-17 DIAGNOSIS — Z87891 Personal history of nicotine dependence: Secondary | ICD-10-CM

## 2020-08-17 DIAGNOSIS — K0889 Other specified disorders of teeth and supporting structures: Secondary | ICD-10-CM | POA: Diagnosis not present

## 2020-08-17 DIAGNOSIS — K9422 Gastrostomy infection: Secondary | ICD-10-CM | POA: Diagnosis not present

## 2020-08-17 DIAGNOSIS — R52 Pain, unspecified: Secondary | ICD-10-CM | POA: Diagnosis not present

## 2020-08-17 DIAGNOSIS — Z79899 Other long term (current) drug therapy: Secondary | ICD-10-CM

## 2020-08-17 DIAGNOSIS — E739 Lactose intolerance, unspecified: Secondary | ICD-10-CM | POA: Diagnosis present

## 2020-08-17 DIAGNOSIS — R279 Unspecified lack of coordination: Secondary | ICD-10-CM | POA: Diagnosis not present

## 2020-08-17 DIAGNOSIS — Z9181 History of falling: Secondary | ICD-10-CM

## 2020-08-17 DIAGNOSIS — B965 Pseudomonas (aeruginosa) (mallei) (pseudomallei) as the cause of diseases classified elsewhere: Secondary | ICD-10-CM | POA: Diagnosis present

## 2020-08-17 DIAGNOSIS — R1110 Vomiting, unspecified: Secondary | ICD-10-CM

## 2020-08-17 HISTORY — DX: Heart failure, unspecified: I50.9

## 2020-08-17 LAB — COMPREHENSIVE METABOLIC PANEL
ALT: 17 U/L (ref 0–44)
AST: 25 U/L (ref 15–41)
Albumin: 3.4 g/dL — ABNORMAL LOW (ref 3.5–5.0)
Alkaline Phosphatase: 78 U/L (ref 38–126)
Anion gap: 10 (ref 5–15)
BUN: 13 mg/dL (ref 8–23)
CO2: 31 mmol/L (ref 22–32)
Calcium: 9.5 mg/dL (ref 8.9–10.3)
Chloride: 98 mmol/L (ref 98–111)
Creatinine, Ser: 0.87 mg/dL (ref 0.44–1.00)
GFR, Estimated: >60 mL/min (ref >60)
Glucose, Bld: 110 mg/dL — ABNORMAL HIGH (ref 70–99)
Potassium: 4.1 mmol/L (ref 3.5–5.1)
Sodium: 139 mmol/L (ref 135–145)
Total Bilirubin: 1 mg/dL (ref 0.3–1.2)
Total Protein: 7.1 g/dL (ref 6.5–8.1)

## 2020-08-17 LAB — CBC WITH DIFFERENTIAL/PLATELET
Abs Immature Granulocytes: 0.07 10*3/uL (ref 0.00–0.07)
Basophils Absolute: 0.1 10*3/uL (ref 0.0–0.1)
Basophils Relative: 0 %
Eosinophils Absolute: 0.1 10*3/uL (ref 0.0–0.5)
Eosinophils Relative: 1 %
HCT: 36.6 % (ref 36.0–46.0)
Hemoglobin: 11.5 g/dL — ABNORMAL LOW (ref 12.0–15.0)
Immature Granulocytes: 1 %
Lymphocytes Relative: 22 %
Lymphs Abs: 2.8 10*3/uL (ref 0.7–4.0)
MCH: 31 pg (ref 26.0–34.0)
MCHC: 31.4 g/dL (ref 30.0–36.0)
MCV: 98.7 fL (ref 80.0–100.0)
Monocytes Absolute: 0.9 10*3/uL (ref 0.1–1.0)
Monocytes Relative: 7 %
Neutro Abs: 8.9 10*3/uL — ABNORMAL HIGH (ref 1.7–7.7)
Neutrophils Relative %: 69 %
Platelets: 291 10*3/uL (ref 150–400)
RBC: 3.71 MIL/uL — ABNORMAL LOW (ref 3.87–5.11)
RDW: 14.1 % (ref 11.5–15.5)
WBC: 12.8 10*3/uL — ABNORMAL HIGH (ref 4.0–10.5)
nRBC: 0 % (ref 0.0–0.2)

## 2020-08-17 LAB — CBC
HCT: 40.5 % (ref 36.0–46.0)
Hemoglobin: 12.3 g/dL (ref 12.0–15.0)
MCH: 30.8 pg (ref 26.0–34.0)
MCHC: 30.4 g/dL (ref 30.0–36.0)
MCV: 101.3 fL — ABNORMAL HIGH (ref 80.0–100.0)
Platelets: 307 10*3/uL (ref 150–400)
RBC: 4 MIL/uL (ref 3.87–5.11)
RDW: 14.3 % (ref 11.5–15.5)
WBC: 16.5 10*3/uL — ABNORMAL HIGH (ref 4.0–10.5)
nRBC: 0 % (ref 0.0–0.2)

## 2020-08-17 LAB — IRON AND TIBC
Iron: 23 ug/dL — ABNORMAL LOW (ref 28–170)
Saturation Ratios: 8 % — ABNORMAL LOW (ref 10.4–31.8)
TIBC: 282 ug/dL (ref 250–450)
UIBC: 259 ug/dL

## 2020-08-17 LAB — URINALYSIS, ROUTINE W REFLEX MICROSCOPIC
Bilirubin Urine: NEGATIVE
Glucose, UA: NEGATIVE mg/dL
Hgb urine dipstick: NEGATIVE
Ketones, ur: NEGATIVE mg/dL
Nitrite: POSITIVE — AB
Protein, ur: 30 mg/dL — AB
Specific Gravity, Urine: 1.019 (ref 1.005–1.030)
WBC, UA: >50 WBC/hpf — ABNORMAL HIGH (ref 0–5)
pH: 7 (ref 5.0–8.0)

## 2020-08-17 LAB — LIPASE, BLOOD: Lipase: 27 U/L (ref 11–51)

## 2020-08-17 LAB — CREATININE, SERUM
Creatinine, Ser: 0.7 mg/dL (ref 0.44–1.00)
GFR, Estimated: >60 mL/min (ref >60)

## 2020-08-17 MED ORDER — FENTANYL CITRATE (PF) 100 MCG/2ML IJ SOLN
50.0000 ug | Freq: Once | INTRAMUSCULAR | Status: AC
  Administered 2020-08-17: 50 ug via INTRAVENOUS
  Filled 2020-08-17: qty 2

## 2020-08-17 MED ORDER — ONDANSETRON HCL 4 MG/2ML IJ SOLN
4.0000 mg | Freq: Four times a day (QID) | INTRAMUSCULAR | Status: DC | PRN
  Administered 2020-08-18 – 2020-08-19 (×4): 4 mg via INTRAVENOUS
  Filled 2020-08-17 (×4): qty 2

## 2020-08-17 MED ORDER — DIATRIZOATE MEGLUMINE & SODIUM 66-10 % PO SOLN
90.0000 mL | Freq: Once | ORAL | Status: DC
  Filled 2020-08-17: qty 90

## 2020-08-17 MED ORDER — ONDANSETRON HCL 4 MG/2ML IJ SOLN
4.0000 mg | Freq: Once | INTRAMUSCULAR | Status: AC
  Administered 2020-08-17: 4 mg via INTRAVENOUS

## 2020-08-17 MED ORDER — MORPHINE SULFATE (PF) 2 MG/ML IV SOLN
2.0000 mg | INTRAVENOUS | Status: DC | PRN
  Administered 2020-08-17 – 2020-08-25 (×18): 2 mg via INTRAVENOUS
  Filled 2020-08-17 (×20): qty 1

## 2020-08-17 MED ORDER — ONDANSETRON HCL 4 MG/2ML IJ SOLN
INTRAMUSCULAR | Status: AC
  Filled 2020-08-17: qty 2

## 2020-08-17 MED ORDER — IOHEXOL 9 MG/ML PO SOLN
1000.0000 mL | ORAL | Status: AC
  Administered 2020-08-17: 1000 mL via ORAL

## 2020-08-17 MED ORDER — ONDANSETRON HCL 4 MG PO TABS: 4.0000 mg | ORAL_TABLET | Freq: Four times a day (QID) | ORAL | Status: DC | PRN

## 2020-08-17 MED ORDER — IOHEXOL 300 MG/ML  SOLN
100.0000 mL | Freq: Once | INTRAMUSCULAR | Status: AC | PRN
  Administered 2020-08-17: 100 mL via INTRAVENOUS

## 2020-08-17 MED ORDER — ONDANSETRON HCL 4 MG/2ML IJ SOLN
4.0000 mg | Freq: Once | INTRAMUSCULAR | Status: AC
  Administered 2020-08-17: 4 mg via INTRAVENOUS
  Filled 2020-08-17: qty 2

## 2020-08-17 MED ORDER — ENOXAPARIN SODIUM 30 MG/0.3ML ~~LOC~~ SOLN
30.0000 mg | SUBCUTANEOUS | Status: DC
  Administered 2020-08-17 – 2020-08-27 (×11): 30 mg via SUBCUTANEOUS
  Filled 2020-08-17 (×11): qty 0.3

## 2020-08-17 MED ORDER — SORBITOL 70 % SOLN
960.0000 mL | TOPICAL_OIL | Freq: Once | ORAL | Status: AC
  Administered 2020-08-17: 960 mL via RECTAL
  Filled 2020-08-17: qty 473

## 2020-08-17 MED ORDER — SODIUM CHLORIDE 0.9 % IV SOLN: Freq: Once | INTRAVENOUS | Status: AC

## 2020-08-17 MED ORDER — IOHEXOL 9 MG/ML PO SOLN
ORAL | Status: AC
  Filled 2020-08-17: qty 1000

## 2020-08-17 MED ORDER — MORPHINE SULFATE (PF) 4 MG/ML IV SOLN
4.0000 mg | Freq: Once | INTRAVENOUS | Status: AC
  Administered 2020-08-17: 4 mg via INTRAVENOUS
  Filled 2020-08-17: qty 1

## 2020-08-17 MED ORDER — SODIUM CHLORIDE 0.9 % IV SOLN
1.0000 g | INTRAVENOUS | Status: DC
  Administered 2020-08-18 – 2020-08-24 (×7): 1 g via INTRAVENOUS
  Filled 2020-08-17: qty 10
  Filled 2020-08-17: qty 1
  Filled 2020-08-17: qty 0.06
  Filled 2020-08-17: qty 1
  Filled 2020-08-17: qty 10
  Filled 2020-08-17 (×3): qty 1

## 2020-08-17 MED ORDER — SODIUM CHLORIDE 0.9 % IV SOLN
1.0000 g | Freq: Once | INTRAVENOUS | Status: AC
  Administered 2020-08-17: 1 g via INTRAVENOUS
  Filled 2020-08-17: qty 10

## 2020-08-17 NOTE — Consult Note (Signed)
[name] [name] Jan 02, 1931  [id number].    Requesting MD: Dr. Isla Pence Chief Complaint/Reason for Consult: SBO  HPI:  This is a sweet 87 yo black female with multiple medical problems including pulmonary fibrosis (followed by Dr. Vaughan Browner) on 2L [place] at home, RA (on prednisone, unclear for which of these things), h/o PE/DVT, CHF, CAD, who lives alone but has some family nearby.  She states she hasn't had a BM in about a week.  She thinks she had some flatus today.  She doesn't have much of an appetite and has been losing weight.  About 3 days ago she developed crampy lower abdominal pain along with nausea and and episode of emesis.  She thought it may be gas and took gas medicine but to no avail.  She threw up again yesterday and continued to have abdominal pain.  She was brought to the ED today where she underwent a CT scan which revealed fecal impaction of the rectum along with findings concerning for a SBO with a transition point in her pelvis.  She was manually disimpacted twice by the EDP and given a SMOG enema with good results on all 3 occasions.  We have been asked to see the patient for further evaluation and recommendations.  An NGT has been attempted 2x but unable due to pain.  ROS: ROS: Please see HPI, otherwise all other systems reviewed and are negative currently.  Family History  Problem Relation Age of Onset  . Kidney disease Daughter 5  . Cancer Daughter   . Heart attack Father 18  . Alzheimer's disease Sister   . Heart disease Sister   . Alzheimer's disease Sister     Past Medical History:  Diagnosis Date  . Allergic rhinitis   . Allergy   . CAD (coronary artery disease)    Mild CAD by cath 2008  . CHF (congestive heart failure) ([place])   . DJD (degenerative joint disease)    rheumatoid  . DVT (deep venous thrombosis) ([place])   . Dyslipidemia   . History of echocardiogram    Echo 12/2018: EF 60-65, mod asymmetric LVH, Gr 1 DD  . History of nuclear stress test     Myoview 5/17:  EF 74%, normal perfusion, low risk // Myoview 12/2018:  EF 89, very mild ischemia in inferoapical wall; Low Risk  . History of pulmonary embolism   . Hyperlipidemia   . Insomnia   . Osteoporosis   . Psoriasis   . Pulmonary fibrosis ([place])   . Rheumatoid arthritis [facility])     Past Surgical History:  Procedure Laterality Date  . ABDOMINAL HYSTERECTOMY  1974  . APPENDECTOMY  1959  . CATARACT EXTRACTION    . LEFT HEART CATHETERIZATION WITH CORONARY ANGIOGRAM N/A 06/18/2013   Procedure: LEFT HEART CATHETERIZATION WITH CORONARY ANGIOGRAM;  Surgeon: Peter M Martinique, MD;  Location: [facility] CATH LAB;  Service: Cardiovascular;  Laterality: N/A;  . TONSILLECTOMY  1941, 1951  . TOTAL KNEE ARTHROPLASTY  1997, 2007   Bilateral    Social History:  reports that she quit smoking about 47 years ago. Her smoking use included cigarettes. She has a 10.00 pack-year smoking history. She has never used smokeless tobacco. She reports that she does not drink alcohol and does not use drugs.  Allergies:  Allergies  Allergen Reactions  . Crestor [Rosuvastatin] Other (See Comments)    Muscle pain  . Lactose Intolerance (Gi) Other (See Comments)    Stomach upset  . Penicillins  Hives  . Imuran [Azathioprine] Nausea And Vomiting    (Not in a hospital admission)    Physical Exam: Blood pressure 109/69, pulse 93, temperature 99 F (37.2 C), temperature source Oral, resp. rate (!) 23, height 5' 4.5" (1.638 m), weight 44.5 kg, SpO2 97 %. General: pleasant, frail, elderly female who is laying in bed in NAD HEENT: head is normocephalic, atraumatic.  Temple wasting.  Sclera are noninjected.  PERRL.  Ears and nose without any masses or lesions.  Mouth is pink and dry Heart: regular, rate, and rhythm.  Normal s1,s2. No obvious murmurs, gallops, or rubs noted.  Palpable radial and pedal pulses bilaterally Lungs: CTAB, no wheezes, rhonchi, or rales noted.  Respiratory effort nonlabored on 2L [place] Abd: soft,  mild tenderness throughout her lower abdomen mostly, ND, +BS, no masses, hernias, or organomegaly MS: all 4 extremities are symmetrical with no cyanosis, clubbing, or edema. With significant muscle atrophy in all extremities Skin: warm and dry with no masses, lesions, or rashes Neuro: Cranial nerves 2-12 grossly intact, sensation is normal throughout Psych: A&Ox3, but does doze off intermittently due to recent pain medication but otherwise with an appropriate affect.   Results for orders placed or performed during the hospital encounter of 08/17/20 (from the past 48 hour(s))  CBC with Differential/Platelet     Status: Abnormal   Collection Time: 08/17/20  4:10 AM  Result Value Ref Range   WBC 12.8 (H) 4.0 - 10.5 K/uL   RBC 3.71 (L) 3.87 - 5.11 MIL/uL   Hemoglobin 11.5 (L) 12.0 - 15.0 g/dL   HCT 36.6 36.0 - 46.0 %   MCV 98.7 80.0 - 100.0 fL   MCH 31.0 26.0 - 34.0 pg   MCHC 31.4 30.0 - 36.0 g/dL   RDW 14.1 11.5 - 15.5 %   Platelets 291 150 - 400 K/uL   nRBC 0.0 0.0 - 0.2 %   Neutrophils Relative % 69 %   Neutro Abs 8.9 (H) 1.7 - 7.7 K/uL   Lymphocytes Relative 22 %   Lymphs Abs 2.8 0.7 - 4.0 K/uL   Monocytes Relative 7 %   Monocytes Absolute 0.9 0.1 - 1.0 K/uL   Eosinophils Relative 1 %   Eosinophils Absolute 0.1 0.0 - 0.5 K/uL   Basophils Relative 0 %   Basophils Absolute 0.1 0.0 - 0.1 K/uL   Immature Granulocytes 1 %   Abs Immature Granulocytes 0.07 0.00 - 0.07 K/uL    Comment: Performed at [facility], [place] [address], [place], [place] [address]  Comprehensive metabolic panel     Status: Abnormal   Collection Time: 08/17/20  4:10 AM  Result Value Ref Range   Sodium 139 135 - 145 mmol/L   Potassium 4.1 3.5 - 5.1 mmol/L   Chloride 98 98 - 111 mmol/L   CO2 31 22 - 32 mmol/L   Glucose, Bld 110 (H) 70 - 99 mg/dL    Comment: Glucose reference range applies only to samples taken after fasting for at least 8 hours.   BUN 13 8 - 23 mg/dL   Creatinine, Ser 0.87  0.44 - 1.00 mg/dL   Calcium 9.5 8.9 - 10.3 mg/dL   Total Protein 7.1 6.5 - 8.1 g/dL   Albumin 3.4 (L) 3.5 - 5.0 g/dL   AST 25 15 - 41 U/L   ALT 17 0 - 44 U/L   Alkaline Phosphatase 78 38 - 126 U/L   Total Bilirubin 1.0 0.3 - 1.2 mg/dL  GFR, Estimated >60 >60 mL/min    Comment: (NOTE) Calculated using the CKD-EPI Creatinine Equation (2021)    Anion gap 10 5 - 15    Comment: Performed at [facility], [place] [address] [place], [place] [address]  Lipase, blood     Status: None   Collection Time: 08/17/20  4:10 AM  Result Value Ref Range   Lipase 27 11 - 51 U/L    Comment: Performed at [facility], [place] [address] [place], [place] [address]  Urinalysis, Routine w reflex microscopic     Status: Abnormal   Collection Time: 08/17/20  4:59 AM  Result Value Ref Range   Color, Urine AMBER (A) YELLOW    Comment: BIOCHEMICALS MAY BE AFFECTED BY COLOR   APPearance CLOUDY (A) CLEAR   Specific Gravity, Urine 1.019 1.005 - 1.030   pH 7.0 5.0 - 8.0   Glucose, UA NEGATIVE NEGATIVE mg/dL   Hgb urine dipstick NEGATIVE NEGATIVE   Bilirubin Urine NEGATIVE NEGATIVE   Ketones, ur NEGATIVE NEGATIVE mg/dL   Protein, ur 30 (A) NEGATIVE mg/dL   Nitrite POSITIVE (A) NEGATIVE   Leukocytes,Ua MODERATE (A) NEGATIVE   RBC / HPF 11-20 0 - 5 RBC/hpf   WBC, UA >50 (H) 0 - 5 WBC/hpf   Bacteria, UA MANY (A) NONE SEEN   Squamous Epithelial / LPF 0-5 0 - 5    Comment: Performed at [facility], [place] [address] [place],  [address]   CT ABDOMEN PELVIS W CONTRAST  Result Date: 08/17/2020 CLINICAL DATA:  Acute generalized abdominal pain. EXAM: CT ABDOMEN AND PELVIS WITH CONTRAST TECHNIQUE: Multidetector CT imaging of the abdomen and pelvis was performed using the standard protocol following bolus administration of intravenous contrast. CONTRAST:  175mL OMNIPAQUE IOHEXOL 300 MG/ML  SOLN COMPARISON:  September 28, 2015. FINDINGS: Lower chest: Stable chronic  cystic changes seen involving the visualized lung bases as noted on prior exam. Hepatobiliary: No gallstones are noted. Stable intrahepatic and extrahepatic biliary dilatation is noted. Liver is unremarkable. Pancreas: Unremarkable. No pancreatic ductal dilatation or surrounding inflammatory changes. Spleen: Normal in size without focal abnormality. Adrenals/Urinary Tract: Adrenal glands are unremarkable. Kidneys are normal, without renal calculi, focal lesion, or hydronephrosis. Bladder is unremarkable. Stomach/Bowel: The stomach appears normal. Large amount of stool seen in the rectum concerning for impaction. Sigmoid diverticulosis is noted without inflammation. Dilated small bowel loops are noted in the pelvis with probable transition zone seen in the pelvis, concerning for distal small bowel obstruction or possibly focal ileus or enteritis. Vascular/Lymphatic: Aortic atherosclerosis. No enlarged abdominal or pelvic lymph nodes. Reproductive: Status post hysterectomy. No adnexal masses. Other: No abdominal wall hernia or abnormality. No abdominopelvic ascites. Musculoskeletal: No acute or significant osseous findings. IMPRESSION: 1. Dilated small bowel loops are noted in the pelvis with probable transition zone seen in the pelvis, concerning for distal small bowel obstruction or possibly focal ileus or enteritis. 2. Large amount of stool seen in the rectum concerning for impaction. 3. Sigmoid diverticulosis without inflammation. 4. Stable intrahepatic and extrahepatic biliary dilatation is noted. 5. Aortic atherosclerosis. Aortic Atherosclerosis (ICD10-I70.0). Electronically Signed   By: Marijo Conception M.D.   On: 08/17/2020 08:29      Assessment/Plan Pulmonary fibrosis, chronic hypoxic respiratory failure, on 2L[place] at home - on prednisone, cellcept, ofev, remicade CHF CAD RA H/O DVT/PE UTI - rocephin per medicine Constipation - this appears to mostly be located in the rectum and sigmoid colon.  She  has  had an enema and manual disimpaction x2.  I do not think this is the source of her obstruction based on her CT scan.  SBO The patient appears to have a SBO as noted on her CT scan which has been personally reviewed by myself and Dr. Dema Severin.  The patient has a history of a hysterectomy and appendectomy.  Her transition point is in the pelvis and likely secondary to adhesive disease from these previous surgeries.  The patient has failed 2 attempts at NGT placement secondary to pain. Ideally, we would like an NGT to be placed.  If unable, then we can still proceed with the SBO protocol as able by having her drink the contrast.  She is a very frail lady and very high risk for surgical intervention that would almost certainly cause a prolonged hospitalization with need for SNF placement to follow vs even possible in hospital demise.  We would like to try everything we can to get her better without surgery if possible.  If not, then we will need to review her advanced directives and her wishes along with a difficult but extensive discussion on her risk/benefit scenario.  Hopefully this can be avoided.  Nephew was at the bedside for our conversation.   FEN - NPO/ideally NGT if can get it placed/IVFs VTE - ok from our standpoint for chemical prophylaxis ID - Rocephin for UTI   Henreitta Cea, [facility] 08/17/2020, 2:25 PM Please see Amion for pager number during day hours 7:00am-4:30pm or 7:00am -11:30am on weekends

## 2020-08-17 NOTE — ED Triage Notes (Signed)
Patient arrives from from home complaining of intermittent abdominal pain x3 days. Patient has not had a bowel movement and is not passing gas. Patient states decreased oral intake, some nausea/vomiting.

## 2020-08-17 NOTE — ED Notes (Signed)
Attempted to place NG tube several times, patient reaching up and pulling tube out.  [place], EDP made aware.

## 2020-08-17 NOTE — ED Provider Notes (Signed)
  Physical Exam  BP 104/76   Pulse 87   Temp 99 F (37.2 C) (Oral)   Resp (!) 21   Ht 5' 4.5" (1.638 m)   Wt 44.5 kg   SpO2 97%   BMI 16.56 kg/m   Physical Exam  ED Course/Procedures     Fecal disimpaction  Date/Time: 08/17/2020 12:11 PM Performed by: Isla Pence, MD Authorized by: Isla Pence, MD  Consent: Verbal consent obtained. Risks and benefits: risks, benefits and alternatives were discussed Consent given by: patient Patient identity confirmed: verbally with patient Time out: Immediately prior to procedure a "time out" was called to verify the correct patient, procedure, equipment, support staff and site/side marked as required. Preparation: Patient was prepped and draped in the usual sterile fashion. Local anesthesia used: no  Anesthesia: Local anesthesia used: no  Sedation: Patient sedated: no  Patient tolerance: patient tolerated the procedure well with no immediate complications     Large amount of stool removed.       Isla Pence, MD 08/17/20 1212

## 2020-08-17 NOTE — ED Notes (Signed)
Patient transported to CT 

## 2020-08-17 NOTE — ED Notes (Signed)
Patient called out and stated her stomach started to hurt while drinking the contrast. Instructed patient to stop and informed radiology of how much she drank.

## 2020-08-17 NOTE — ED Provider Notes (Signed)
[facility] DEPT Provider Note   CSN: [id number] Arrival date & time: 08/17/20  0310     History Chief Complaint  Patient presents with  . Abdominal Pain    [name] [name] is a 87 y.o. female.  The history is provided by the patient.  Abdominal Pain Pain location:  Generalized Pain quality: aching   Pain radiates to:  Does not radiate Pain severity:  Moderate Onset quality:  Gradual Duration:  3 days Timing:  Intermittent Progression:  Worsening Chronicity:  New Relieved by:  Nothing Worsened by:  Movement and palpation Associated symptoms: constipation, dysuria, nausea and vomiting   Associated symptoms: no chest pain, no diarrhea, no fever and no hematemesis   Associated symptoms comment:  Chronic shortness of breath Patient with extensive history including CAD, pulmonary fibrosis on home oxygen presents with abdominal pain.  Patient reports intermittent abdominal pain for the past 3 days, worse over the past 24 hours.  She now reports associated nausea and vomiting.  She reports constipation and is unable to recall the last time she had a bowel movement She also reports some dysuria.     Past Medical History:  Diagnosis Date  . Allergic rhinitis   . Allergy   . CAD (coronary artery disease)    Mild CAD by cath 2008  . DJD (degenerative joint disease)    rheumatoid  . DVT (deep venous thrombosis) ([place])   . Dyslipidemia   . History of echocardiogram    Echo 12/2018: EF 60-65, mod asymmetric LVH, Gr 1 DD  . History of nuclear stress test    Myoview 5/17:  EF 74%, normal perfusion, low risk // Myoview 12/2018:  EF 89, very mild ischemia in inferoapical wall; Low Risk  . History of pulmonary embolism   . Hyperlipidemia   . Insomnia   . Osteoporosis   . Psoriasis   . Pulmonary fibrosis ([place])   . Rheumatoid arthritis [facility])     Patient Active Problem List   Diagnosis Date Noted  . Fall 01/25/2020  . Other fatigue 01/03/2020  . Left  shoulder pain 01/03/2020  . Urinary frequency 01/03/2020  . Acute otalgia, left 09/06/2019  . Unintentional weight loss 07/18/2019  . Personal history of PE (pulmonary embolism) 04/23/2019  . Headache 02/11/2019  . Iron deficiency anemia 12/21/2018  . Cold sore 10/23/2018  . Blood in stool 09/14/2018  . 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 Therapeutic drug monitoring 07/17/2018  . Chronic diastolic CHF (congestive heart failure) ([place]) 12/19/2017  . Rash 08/11/2017  . Chronic respiratory failure with hypoxia ([place]) 03/30/2017  . Angular cheilitis 03/08/2017  . Leg pain 10/25/2016  . Back pain 08/05/2016  . RA (rheumatoid arthritis) ([place]) 03/31/2015  . Allergic rhinitis   . Varicose vein 09/11/2014  . ILD (interstitial lung disease) ([place]) 06/25/2012  . Chest pain 02/27/2012  . CAD (coronary artery disease) 02/27/2012  . Pruritus 08/18/2009  . Postinflammatory pulmonary fibrosis / RA ILD  06/30/2008  . Constipation 01/03/2008  . Dyslipidemia 06/16/2007  . Personal history of DVT (deep vein thrombosis) 06/16/2007    Past Surgical History:  Procedure Laterality Date  . ABDOMINAL HYSTERECTOMY  1974  . APPENDECTOMY  1959  . CATARACT EXTRACTION    . LEFT HEART CATHETERIZATION WITH CORONARY ANGIOGRAM N/A 06/18/2013   Procedure: LEFT HEART CATHETERIZATION WITH CORONARY ANGIOGRAM;  Surgeon: Peter M Martinique, MD;  Location: [facility] CATH LAB;  Service: Cardiovascular;  Laterality: N/A;  . TONSILLECTOMY  1941, 1951  .  TOTAL KNEE ARTHROPLASTY  1997, 2007   Bilateral     OB History    Gravida  4   Para  2   Term  2   Preterm      AB      Living  0     SAB      IAB      Ectopic      Multiple      Live Births              Family History  Problem Relation Age of Onset  . Kidney disease Daughter 5  . Cancer Daughter   . Heart attack Father 36  . Alzheimer's disease Sister   . Heart disease Sister   . Alzheimer's disease Sister     Social History   Tobacco Use  .  Smoking status: Former Smoker    Packs/day: 0.50    Years: 20.00    Pack years: 10.00    Types: Cigarettes    Quit date: 07/11/1973    Years since quitting: 47.1  . Smokeless tobacco: Never Used  Vaping Use  . Vaping Use: Never used  Substance Use Topics  . Alcohol use: No  . Drug use: No    Home Medications Prior to Admission medications   Medication Sig Start Date End Date Taking? Authorizing Provider  aspirin EC 81 MG tablet Take 1 tablet (81 mg total) by mouth daily. 11/30/15   Richardson Dopp T, PA-C  atorvastatin (LIPITOR) 40 MG tablet Take 1 tablet (40 mg total) by mouth daily. 04/28/20   Burnell Blanks, MD  dapsone 100 MG tablet TAKE 1 TABLET(100 MG) BY MOUTH DAILY Patient taking differently: Take 100 mg by mouth daily. 12/03/19   Mannam, Hart Robinsons, MD  fluticasone (FLONASE) 50 MCG/ACT nasal spray Place 2 sprays into both nostrils daily as needed for allergies or rhinitis. 03/17/15   Hoyt Koch, MD  furosemide (LASIX) 20 MG tablet Take 1 tablet by mouth daily as needed for swelling. Patient taking differently: Take 20 mg by mouth daily as needed for fluid or edema. 12/11/18   Burtis Junes, NP  InFLIXimab (REMICADE IV) Inject 5 mg into the vein every 14 (fourteen) days.     [provider]  levalbuterol Penne Lash HFA) 45 MCG/ACT inhaler Inhale 2 puffs into the lungs every 6 (six) hours as needed for wheezing. 06/05/17   Magdalen Spatz, NP  metoprolol succinate (TOPROL XL) 25 MG 24 hr tablet Take 1 tablet (25 mg total) by mouth daily. 12/12/18   Richardson Dopp T, PA-C  mirtazapine (REMERON) 15 MG tablet Take 1 tablet (15 mg total) by mouth at bedtime. 07/18/19   Hoyt Koch, MD  mycophenolate (CELLCEPT) 500 MG tablet 2 tabs bid 01/27/20   Charlynne Cousins, MD  Nintedanib (OFEV) 100 MG CAPS Take 1 capsule (100 mg total) by mouth 2 (two) times daily. 03/10/20   Mannam, Hart Robinsons, MD  nitroGLYCERIN (NITROSTAT) 0.4 MG SL tablet Place 1 tablet (0.4 mg total)  under the tongue every 5 (five) minutes as needed for chest pain. 06/29/20 09/27/20  Burnell Blanks, MD  NONFORMULARY OR COMPOUNDED ITEM Antifungal solution: Terbinafine 3%, Fluconazole 2%, Tea Tree Oil 5%, Urea 10%, Ibuprofen 2% in DMSO suspension #30mL 02/03/20   [place], Stephani Police, DPM  nystatin cream (MYCOSTATIN) Apply topically. 01/16/20   [provider]  nystatin-triamcinolone (MYCOLOG II) cream Apply 1 application topically 2 (two) times daily. 04/13/20   Jodi Mourning  Jasmine December, FNP  Polyethyl Glycol-Propyl Glycol 0.4-0.3 % SOLN Apply 1 drop to eye daily as needed (for dry eyes).    [provider]  polyethylene glycol powder (GLYCOLAX/MIRALAX) powder Take 17 g by mouth 2 (two) times daily as needed. Patient taking differently: Take 17 g by mouth 2 (two) times daily as needed for mild constipation. 09/13/18   Hoyt Koch, MD  predniSONE (DELTASONE) 5 MG tablet TAKE 1 TABLET DAILY WITH BREAKFAST 05/08/20   Mannam, Praveen, MD  triamcinolone cream (KENALOG) 0.1 % Apply 1 application topically 2 (two) times daily. 04/23/18   Hoyt Koch, MD    Allergies    Crestor [rosuvastatin], Lactose intolerance (gi), Penicillins, and Imuran [azathioprine]  Review of Systems   Review of Systems  Constitutional: Positive for unexpected weight change. Negative for fever.  Respiratory:       Chronic shortness of breath  Cardiovascular: Negative for chest pain.  Gastrointestinal: Positive for abdominal pain, constipation, nausea and vomiting. Negative for diarrhea and hematemesis.  Genitourinary: Positive for dysuria.  All other systems reviewed and are negative.   Physical Exam Updated Vital Signs BP 103/74   Pulse 92   Temp 99 F (37.2 C) (Oral)   Resp 20   Ht 1.638 m (5' 4.5")   Wt 44.5 kg   SpO2 96%   BMI 16.56 kg/m   Physical Exam CONSTITUTIONAL: Elderly and frail HEAD: Normocephalic/atraumatic EYES: EOMI/PERRL ENMT: Mucous membranes  moist NECK: supple no meningeal signs SPINE/BACK:entire spine nontender CV: S1/S2 noted, no murmurs/rubs/gallops noted LUNGS: fine crackles bilaterally, no apparent distress, on home oxygen ABDOMEN: soft, nondistended.  Diffuse moderate tenderness.  Mild guarding is noted.  Decreased bowel sounds noted throughout Rectal-stool impaction noted.  No blood or melena.  Nurse Clarise Cruz present for exam GU:no cva tenderness NEURO: Pt is awake/alert/appropriate, moves all extremitiesx4.  No facial droop.   EXTREMITIES: pulses normal/equal, full ROM SKIN: warm, color normal PSYCH: no abnormalities of mood noted, alert and oriented to situation  ED Results / Procedures / Treatments   Labs (all labs ordered are listed, but only abnormal results are displayed) Labs Reviewed  CBC WITH DIFFERENTIAL/PLATELET - Abnormal; Notable for the following components:      Result Value   WBC 12.8 (*)    RBC 3.71 (*)    Hemoglobin 11.5 (*)    Neutro Abs 8.9 (*)    All other components within normal limits  COMPREHENSIVE METABOLIC PANEL - Abnormal; Notable for the following components:   Glucose, Bld 110 (*)    Albumin 3.4 (*)    All other components within normal limits  URINALYSIS, ROUTINE W REFLEX MICROSCOPIC - Abnormal; Notable for the following components:   Color, Urine AMBER (*)    APPearance CLOUDY (*)    Protein, ur 30 (*)    Nitrite POSITIVE (*)    Leukocytes,Ua MODERATE (*)    WBC, UA >50 (*)    Bacteria, UA MANY (*)    All other components within normal limits  LIPASE, BLOOD    EKG EKG Interpretation  Date/Time:  Monday August 17 2020 04:38:29 EST Ventricular Rate:  88 PR Interval:    QRS Duration: 77 QT Interval:  358 QTC Calculation: 434 R Axis:   36 Text Interpretation: Sinus rhythm Probable left atrial enlargement Borderline low voltage, extremity leads Minimal ST depression Minimal ST elevation, inferior leads Confirmed by Ripley Fraise [phone]) on 08/17/2020 4:58:04  AM   Radiology No results found.  Procedures Fecal disimpaction  Date/Time:  08/17/2020 4:05 AM Performed by: Ripley Fraise, MD Authorized by: Ripley Fraise, MD  Consent: Verbal consent obtained. Patient identity confirmed: verbally with patient Local anesthesia used: no  Anesthesia: Local anesthesia used: no  Sedation: Patient sedated: no  Patient tolerance: patient tolerated the procedure well with no immediate complications Comments: Partial fecal disimpaction attempted, no immediate complications, patient tolerated well      Medications Ordered in ED Medications  cefTRIAXone (ROCEPHIN) 1 g in sodium chloride 0.9 % 100 mL IVPB (1 g Intravenous New Bag/Given 08/17/20 0654)  fentaNYL (SUBLIMAZE) injection 50 mcg (50 mcg Intravenous Given 08/17/20 0426)  ondansetron (ZOFRAN) injection 4 mg (4 mg Intravenous Given 08/17/20 0425)  iohexol (OMNIPAQUE) 9 MG/ML oral solution 1,000 mL (1,000 mLs Oral Contrast Given 08/17/20 0518)  fentaNYL (SUBLIMAZE) injection 50 mcg (50 mcg Intravenous Given 08/17/20 0654)  ondansetron (ZOFRAN) injection 4 mg (4 mg Intravenous Given 08/17/20 0654)    ED Course  I have reviewed the triage vital signs and the nursing notes.  Pertinent labs  results that were available during my care of the patient were reviewed by me and considered in my medical decision making (see chart for details).    MDM Rules/Calculators/A&P                          4:14 AM Patient presents with abdominal pain for the past 3 days that is worsening.  Patient reports chronic constipation and unsure when her last bowel movement occurred.  She does have stool impaction which was partially relieved on my exam.  However due to persistent abdominal pain and vomiting, will proceed with CT imaging of abdomen pelvis.  Patient also reports significant weight loss 6:06 AM Pt found to have UTI.  She has PCN allergy but reports that happened only once and she has had previously without  issue Will give rocephin while awaiting CT imaging 7:03 AM Signed out to Dr. Gilford Raid at shift change to f/u on CT imaging Pt will need treatment for UTI Final Clinical Impression(s) / ED Diagnoses Final diagnoses:  Acute cystitis without hematuria  Slow transit constipation    Rx / DC Orders ED Discharge Orders    None       Ripley Fraise, MD 08/17/20 [phone]

## 2020-08-17 NOTE — H&P (Signed)
History and Physical    KASIDY POGANY [id number] DOB: 02-28-1931 DOA: 08/17/2020  PCP: Hoyt Koch, MD  Patient coming from: Home  Chief Complaint: abdominal pain.  HPI: [name] [name] is a 87 y.o. female with medical history significant of chronic hypoxic respiratory failure on 2L [place], ILD, rheum arthritis. Presenting with N/V ab pain. Reports that it started 3 days ago. It is present globally. It is sharp and comes in waves. She tried gas x to help, but it did not. She has not been able to eat for the last 2 days d/t N/V. She reports that she hasn't had a BM in over a week. She decided to come to the ED as her symptoms persisted. She denies any other aggravating or alleviating factors.   ED Course: Found to have SBO and fecal impaction. She was disimpacted x 2 and given an enema. Gen surg was consulted for SBO. TRH was called for admission.   Review of Systems:  Denies CP, palpitations, dyspnea, syncopal episodes, D, fever. Reports N/V, ab pain, poor appetite, weakness. Review of systems is otherwise negative for all not mentioned in HPI.   PMHx Past Medical History:  Diagnosis Date  . Allergic rhinitis   . Allergy   . CAD (coronary artery disease)    Mild CAD by cath 2008  . CHF (congestive heart failure) ([place])   . DJD (degenerative joint disease)    rheumatoid  . DVT (deep venous thrombosis) ([place])   . Dyslipidemia   . History of echocardiogram    Echo 12/2018: EF 60-65, mod asymmetric LVH, Gr 1 DD  . History of nuclear stress test    Myoview 5/17:  EF 74%, normal perfusion, low risk // Myoview 12/2018:  EF 89, very mild ischemia in inferoapical wall; Low Risk  . History of pulmonary embolism   . Hyperlipidemia   . Insomnia   . Osteoporosis   . Psoriasis   . Pulmonary fibrosis ([place])   . Rheumatoid arthritis ([place])     PSHx Past Surgical History:  Procedure Laterality Date  . ABDOMINAL HYSTERECTOMY  1974  . APPENDECTOMY  1959  . CATARACT EXTRACTION    . LEFT  HEART CATHETERIZATION WITH CORONARY ANGIOGRAM N/A 06/18/2013   Procedure: LEFT HEART CATHETERIZATION WITH CORONARY ANGIOGRAM;  Surgeon: Peter M Martinique, MD;  Location: [facility] CATH LAB;  Service: Cardiovascular;  Laterality: N/A;  . TONSILLECTOMY  1941, 1951  . TOTAL KNEE ARTHROPLASTY  1997, 2007   Bilateral    SocHx  reports that she quit smoking about 47 years ago. Her smoking use included cigarettes. She has a 10.00 pack-year smoking history. She has never used smokeless tobacco. She reports that she does not drink alcohol and does not use drugs.  Allergies  Allergen Reactions  . Crestor [Rosuvastatin] Other (See Comments)    Muscle pain  . Lactose Intolerance (Gi) Other (See Comments)    Stomach upset  . Penicillins Hives  . Imuran [Azathioprine] Nausea And Vomiting    FamHx Family History  Problem Relation Age of Onset  . Kidney disease Daughter 5  . Cancer Daughter   . Heart attack Father 33  . Alzheimer's disease Sister   . Heart disease Sister   . Alzheimer's disease Sister     Prior to Admission medications   Medication Sig Start Date End Date Taking? Authorizing Provider  atorvastatin (LIPITOR) 40 MG tablet Take 1 tablet (40 mg total) by mouth daily. 04/28/20  Yes Burnell Blanks, MD  dapsone 100 MG tablet TAKE 1 TABLET(100 MG) BY MOUTH DAILY Patient taking differently: Take 100 mg by mouth daily. 12/03/19  Yes Mannam, Praveen, MD  furosemide (LASIX) 20 MG tablet Take 1 tablet by mouth daily as needed for swelling. Patient taking differently: Take 20 mg by mouth daily as needed for fluid or edema. 12/11/18  Yes Burtis Junes, NP  InFLIXimab (REMICADE IV) Inject into the vein as directed. Every 2 months   Yes [provider]  mycophenolate (CELLCEPT) 500 MG tablet 2 tabs bid Patient taking differently: Take 1,000 mg by mouth 2 (two) times daily. 01/27/20  Yes Charlynne Cousins, MD  Nintedanib (OFEV) 100 MG CAPS Take 1 capsule (100 mg total) by mouth 2  (two) times daily. 03/10/20  Yes Mannam, Praveen, MD  nitroGLYCERIN (NITROSTAT) 0.4 MG SL tablet Place 1 tablet (0.4 mg total) under the tongue every 5 (five) minutes as needed for chest pain. 06/29/20 09/27/20 Yes Burnell Blanks, MD  predniSONE (DELTASONE) 5 MG tablet TAKE 1 TABLET DAILY WITH BREAKFAST Patient taking differently: Take 5 mg by mouth daily with breakfast. 05/08/20  Yes Mannam, Praveen, MD  aspirin EC 81 MG tablet Take 1 tablet (81 mg total) by mouth daily. Patient not taking: Reported on 08/17/2020 11/30/15   Richardson Dopp T, PA-C  fluticasone [facility]) 50 MCG/ACT nasal spray Place 2 sprays into both nostrils daily as needed for allergies or rhinitis. 03/17/15   Hoyt Koch, MD  metoprolol succinate (TOPROL XL) 25 MG 24 hr tablet Take 1 tablet (25 mg total) by mouth daily. Patient not taking: Reported on 08/17/2020 12/12/18   Richardson Dopp T, PA-C  mirtazapine (REMERON) 15 MG tablet Take 1 tablet (15 mg total) by mouth at bedtime. Patient not taking: Reported on 08/17/2020 07/18/19   Hoyt Koch, MD  nystatin-triamcinolone [facility] II) cream Apply 1 application topically 2 (two) times daily. Patient not taking: Reported on 08/17/2020 04/13/20   Marrian Salvage, FNP    Physical Exam: Vitals:   08/17/20 1130 08/17/20 1200 08/17/20 1230 08/17/20 1300  BP: 104/76 102/71 110/66 104/65  Pulse: 87 92 92 91  Resp: (!) 21 (!) 21 (!) 23 (!) 21  Temp:      TempSrc:      SpO2: 97% 97% 97% 97%  Weight:      Height:        General: 87 y.o. female resting in bed in NAD, frail appearing Eyes: PERRL, normal sclera ENMT: Nares patent w/o discharge, orophaynx clear, dentition normal, ears w/o discharge/lesions/ulcers Neck: Supple, trachea midline Cardiovascular: RRR, +S1, S2, no m/g/r, equal pulses throughout Respiratory: CTABL, no w/r/r, normal WOB GI: BS hypokinetic, ND, globally TTP, no masses noted, no organomegaly noted MSK: No e/c/c Skin: No rashes, bruises,  ulcerations noted Neuro: A&O x 3, no focal deficits Psyc: Appropriate interaction and flat affect, calm/cooperative  Labs on Admission: I have personally reviewed following labs and imaging studies  CBC: Recent Labs  Lab 08/17/20 0410  WBC 12.8*  NEUTROABS 8.9*  HGB 11.5*  HCT 36.6  MCV 98.7  PLT 323   Basic Metabolic Panel: Recent Labs  Lab 08/17/20 0410  NA 139  K 4.1  CL 98  CO2 31  GLUCOSE 110*  BUN 13  CREATININE 0.87  CALCIUM 9.5   GFR: Estimated Creatinine Clearance: 32 mL/min (by C-G formula based on SCr of 0.87 mg/dL). Liver Function Tests: Recent Labs  Lab 08/17/20 0410  AST 25  ALT 17  ALKPHOS  78  BILITOT 1.0  PROT 7.1  ALBUMIN 3.4*   Recent Labs  Lab 08/17/20 0410  LIPASE 27   No results for input(s): AMMONIA in the last 168 hours. Coagulation Profile: No results for input(s): INR, PROTIME in the last 168 hours. Cardiac Enzymes: No results for input(s): CKTOTAL, CKMB, CKMBINDEX, TROPONINI in the last 168 hours. BNP (last 3 results) Recent Labs    06/03/20 1001  PROBNP 100   HbA1C: No results for input(s): HGBA1C in the last 72 hours. CBG: No results for input(s): GLUCAP in the last 168 hours. Lipid Profile: No results for input(s): CHOL, HDL, LDLCALC, TRIG, CHOLHDL, LDLDIRECT in the last 72 hours. Thyroid Function Tests: No results for input(s): TSH, T4TOTAL, FREET4, T3FREE, THYROIDAB in the last 72 hours. Anemia Panel: No results for input(s): VITAMINB12, FOLATE, FERRITIN, TIBC, IRON, RETICCTPCT in the last 72 hours. Urine analysis:    Component Value Date/Time   COLORURINE AMBER (A) 08/17/2020 0459   APPEARANCEUR CLOUDY (A) 08/17/2020 0459   LABSPEC 1.019 08/17/2020 0459   PHURINE 7.0 08/17/2020 0459   GLUCOSEU NEGATIVE 08/17/2020 0459   GLUCOSEU NEGATIVE 01/03/2020 1434   HGBUR NEGATIVE 08/17/2020 0459   BILIRUBINUR NEGATIVE 08/17/2020 0459   BILIRUBINUR N 01/24/2014 1354   KETONESUR NEGATIVE 08/17/2020 0459   PROTEINUR  30 (A) 08/17/2020 0459   UROBILINOGEN 4.0 (A) 01/03/2020 1434   NITRITE POSITIVE (A) 08/17/2020 0459   LEUKOCYTESUR MODERATE (A) 08/17/2020 0459    Radiological Exams on Admission: CT ABDOMEN PELVIS W CONTRAST  Result Date: 08/17/2020 CLINICAL DATA:  Acute generalized abdominal pain. EXAM: CT ABDOMEN AND PELVIS WITH CONTRAST TECHNIQUE: Multidetector CT imaging of the abdomen and pelvis was performed using the standard protocol following bolus administration of intravenous contrast. CONTRAST:  197mL OMNIPAQUE IOHEXOL 300 MG/ML  SOLN COMPARISON:  September 28, 2015. FINDINGS: Lower chest: Stable chronic cystic changes seen involving the visualized lung bases as noted on prior exam. Hepatobiliary: No gallstones are noted. Stable intrahepatic and extrahepatic biliary dilatation is noted. Liver is unremarkable. Pancreas: Unremarkable. No pancreatic ductal dilatation or surrounding inflammatory changes. Spleen: Normal in size without focal abnormality. Adrenals/Urinary Tract: Adrenal glands are unremarkable. Kidneys are normal, without renal calculi, focal lesion, or hydronephrosis. Bladder is unremarkable. Stomach/Bowel: The stomach appears normal. Large amount of stool seen in the rectum concerning for impaction. Sigmoid diverticulosis is noted without inflammation. Dilated small bowel loops are noted in the pelvis with probable transition zone seen in the pelvis, concerning for distal small bowel obstruction or possibly focal ileus or enteritis. Vascular/Lymphatic: Aortic atherosclerosis. No enlarged abdominal or pelvic lymph nodes. Reproductive: Status post hysterectomy. No adnexal masses. Other: No abdominal wall hernia or abnormality. No abdominopelvic ascites. Musculoskeletal: No acute or significant osseous findings. IMPRESSION: 1. Dilated small bowel loops are noted in the pelvis with probable transition zone seen in the pelvis, concerning for distal small bowel obstruction or possibly focal ileus or  enteritis. 2. Large amount of stool seen in the rectum concerning for impaction. 3. Sigmoid diverticulosis without inflammation. 4. Stable intrahepatic and extrahepatic biliary dilatation is noted. 5. Aortic atherosclerosis. Aortic Atherosclerosis (ICD10-I70.0). Electronically Signed   By: Marijo Conception M.D.   On: 08/17/2020 08:29    EKG: Independently reviewed. sinus  Assessment/Plan SBO Fecal impaction Abdominal pain     - admit to med-surg     - NPO     - gen surg has seen; not able to tolerate NGT, so will get gastrografin by mouth and follow SBO protocol;  they will continue to follow     - she has had fecal disimpaction x 2 and an enema; let's see follow up KUB w/ gastrografin before scheduling another enema; we will give her IVF   UTI     - rocephin, follow up UCx  Normocytic anemia     - no evidence of bleed     - check iron studies  Chronic hypoxic respiratory failure on 2L [place] ILD Rheum arthritis     - continue home meds when she is able to tolerate PO  Hx of DVT/PE     - not on anticoagulation d/t Hx of falls   Severe protein-calorie malnutrition     - dietary consult once able to tolerate PO  DVT prophylaxis: lovenox  Code Status: FULL  Family Communication: With nephew at bedside  Consults called: EDP spoke with Gen surg  Status is: Inpatient  Remains inpatient appropriate because:Inpatient level of care appropriate due to severity of illness   Dispo: The patient is from: Home              Anticipated d/c is to: Home              Anticipated d/c date is: 3 days              Patient currently is not medically stable to d/c.   Difficult to place patient No  Jonnie Finner DO [facility]  If 7PM-7AM, please contact night-coverage [email or web address]  08/17/2020, 1:16 PM

## 2020-08-17 NOTE — ED Notes (Signed)
Patient having abdominal pain, asking for pain medication, [place], EDP made aware.  Patient's nephew at bedside asking to speak with MD.  Gilford Raid aware.

## 2020-08-17 NOTE — ED Provider Notes (Signed)
Pt signed out by Dr. Christy Gentles at shift change.    CT abd/pelvis:  IMPRESSION: 1. Dilated small bowel loops are noted in the pelvis with probable transition zone seen in the pelvis, concerning for distal small bowel obstruction or possibly focal ileus or enteritis. 2. Large amount of stool seen in the rectum concerning for impaction. 3. Sigmoid diverticulosis without inflammation. 4. Stable intrahepatic and extrahepatic biliary dilatation is noted. 5. Aortic atherosclerosis.   Due to the large amount of stool, an additional fecal disimpaction done.  Pt's nurse also have her an enema which resulted in a large amount of stool.  Pt is still having a lot of abdominal pain and has continued to vomit.  Pt given additional morphine and zofran.  NG tube ordered, but pt unable to tolerate.  Pt d/w surgery who will see pt in consult.   Pt d/w Dr. Marylyn Ishihara ([facility]) for pain.   Isla Pence, MD 08/17/20 [phone]

## 2020-08-18 ENCOUNTER — Inpatient Hospital Stay (HOSPITAL_COMMUNITY): Payer: Medicare Other

## 2020-08-18 ENCOUNTER — Telehealth: Payer: Self-pay | Admitting: Pulmonary Disease

## 2020-08-18 DIAGNOSIS — K56609 Unspecified intestinal obstruction, unspecified as to partial versus complete obstruction: Secondary | ICD-10-CM | POA: Diagnosis not present

## 2020-08-18 LAB — CBC
HCT: 37.6 % (ref 36.0–46.0)
Hemoglobin: 11.7 g/dL — ABNORMAL LOW (ref 12.0–15.0)
MCH: 31.2 pg (ref 26.0–34.0)
MCHC: 31.1 g/dL (ref 30.0–36.0)
MCV: 100.3 fL — ABNORMAL HIGH (ref 80.0–100.0)
Platelets: 292 10*3/uL (ref 150–400)
RBC: 3.75 MIL/uL — ABNORMAL LOW (ref 3.87–5.11)
RDW: 14.5 % (ref 11.5–15.5)
WBC: 14.6 10*3/uL — ABNORMAL HIGH (ref 4.0–10.5)
nRBC: 0 % (ref 0.0–0.2)

## 2020-08-18 LAB — COMPREHENSIVE METABOLIC PANEL
ALT: 15 U/L (ref 0–44)
AST: 20 U/L (ref 15–41)
Albumin: 3.4 g/dL — ABNORMAL LOW (ref 3.5–5.0)
Alkaline Phosphatase: 75 U/L (ref 38–126)
Anion gap: 13 (ref 5–15)
BUN: 14 mg/dL (ref 8–23)
CO2: 28 mmol/L (ref 22–32)
Calcium: 9.3 mg/dL (ref 8.9–10.3)
Chloride: 98 mmol/L (ref 98–111)
Creatinine, Ser: 0.8 mg/dL (ref 0.44–1.00)
GFR, Estimated: >60 mL/min (ref >60)
Glucose, Bld: 105 mg/dL — ABNORMAL HIGH (ref 70–99)
Potassium: 4 mmol/L (ref 3.5–5.1)
Sodium: 139 mmol/L (ref 135–145)
Total Bilirubin: 1 mg/dL (ref 0.3–1.2)
Total Protein: 6.9 g/dL (ref 6.5–8.1)

## 2020-08-18 LAB — SARS CORONAVIRUS 2 (TAT 6-24 HRS): SARS Coronavirus 2: NEGATIVE

## 2020-08-18 MED ORDER — NINTEDANIB ESYLATE 100 MG PO CAPS: 100.0000 mg | ORAL_CAPSULE | Freq: Two times a day (BID) | ORAL | Status: DC

## 2020-08-18 MED ORDER — MIRTAZAPINE 15 MG PO TABS
15.0000 mg | ORAL_TABLET | Freq: Every day | ORAL | Status: DC
  Administered 2020-08-18 – 2020-08-19 (×2): 15 mg via ORAL
  Filled 2020-08-18 (×3): qty 1

## 2020-08-18 MED ORDER — MUSCLE RUB 10-15 % EX CREA
TOPICAL_CREAM | CUTANEOUS | Status: DC | PRN
  Filled 2020-08-18: qty 85

## 2020-08-18 MED ORDER — DAPSONE 100 MG PO TABS
100.0000 mg | ORAL_TABLET | Freq: Every day | ORAL | Status: DC
  Administered 2020-08-19: 100 mg via ORAL
  Filled 2020-08-18 (×5): qty 1

## 2020-08-18 MED ORDER — PREDNISONE 5 MG PO TABS
5.0000 mg | ORAL_TABLET | Freq: Every day | ORAL | Status: DC
  Administered 2020-08-18 – 2020-08-20 (×3): 5 mg via ORAL
  Filled 2020-08-18 (×5): qty 1

## 2020-08-18 MED ORDER — OFEV 100 MG PO CAPS: 100.0000 mg | ORAL_CAPSULE | Freq: Two times a day (BID) | ORAL | 11 refills | Status: DC

## 2020-08-18 MED ORDER — FLUTICASONE PROPIONATE 50 MCG/ACT NA SUSP: 2.0000 | Freq: Every day | NASAL | Status: DC | PRN

## 2020-08-18 MED ORDER — ATORVASTATIN CALCIUM 40 MG PO TABS
40.0000 mg | ORAL_TABLET | Freq: Every day | ORAL | Status: DC
  Administered 2020-08-19: 40 mg via ORAL
  Filled 2020-08-18 (×4): qty 1

## 2020-08-18 MED ORDER — MYCOPHENOLATE MOFETIL 250 MG PO CAPS
1000.0000 mg | ORAL_CAPSULE | Freq: Two times a day (BID) | ORAL | Status: DC
  Administered 2020-08-18 – 2020-08-19 (×4): 1000 mg via ORAL
  Filled 2020-08-18 (×10): qty 4

## 2020-08-18 MED ORDER — POLYETHYLENE GLYCOL 3350 17 G PO PACK
17.0000 g | PACK | Freq: Two times a day (BID) | ORAL | Status: DC
  Administered 2020-08-18 – 2020-08-19 (×4): 17 g via ORAL
  Filled 2020-08-18 (×6): qty 1

## 2020-08-18 NOTE — Telephone Encounter (Signed)
[place] and spoke with Sharee Pimple, pharmacist and provided verbal Rx info for pt's OFEV 100mg . Nothing further needed.

## 2020-08-18 NOTE — Progress Notes (Signed)
Subjective No acute events. She reports no nausea or vomiting since ER yesterday. She reports her distention in her belly has ~resolved. She has had 2 bowel movements. She denies any abdominal pain.  Objective: Vital signs in last 24 hours: Temp:  [99.1 F (37.3 C)-100.1 F (37.8 C)] 99.1 F (37.3 C) (02/08 0245) Pulse Rate:  [79-99] 94 (02/08 0245) Resp:  [14-26] 16 (02/08 0245) BP: (94-120)/(55-76) 100/57 (02/08 0245) SpO2:  [91 %-98 %] 93 % (02/08 0245) Last BM Date: 08/17/20 (enema and disimpaction)  Intake/Output from previous day: 02/07 0701 - 02/08 0700 In: 118.7 [I.V.:18.7; IV Piggyback:100] Out: -  Intake/Output this shift: No intake/output data recorded.  Gen: NAD, comfortable CV: RRR Pulm: Normal work of breathing Abd: Soft scaphoid abdomen, NT/ND. No rebound nor guarding. Ext: SCDs in place  Lab Results: CBC  Recent Labs    08/17/20 2025 08/18/20 0527  WBC 16.5* 14.6*  HGB 12.3 11.7*  HCT 40.5 37.6  PLT 307 292   BMET Recent Labs    08/17/20 0410 08/17/20 2025 08/18/20 0527  NA 139  --  139  K 4.1  --  4.0  CL 98  --  98  CO2 31  --  28  GLUCOSE 110*  --  105*  BUN 13  --  14  CREATININE 0.87 0.70 0.80  CALCIUM 9.5  --  9.3   PT/INR No results for input(s): LABPROT, INR in the last 72 hours. ABG No results for input(s): PHART, HCO3 in the last 72 hours.  Invalid input(s): PCO2, PO2  Studies/Results:  Anti-infectives: Anti-infectives (From admission, onward)   Start     Dose/Rate Route Frequency Ordered Stop   08/18/20 1000  cefTRIAXone (ROCEPHIN) 1 g in sodium chloride 0.9 % 100 mL IVPB        1 g 200 mL/hr over 30 Minutes Intravenous Every 24 hours 08/17/20 1414     08/17/20 0615  cefTRIAXone (ROCEPHIN) 1 g in sodium chloride 0.9 % 100 mL IVPB        1 g 200 mL/hr over 30 Minutes Intravenous  Once 08/17/20 8527 08/17/20 0801       Assessment/Plan: Patient Active Problem List   Diagnosis Date Noted  . SBO (small bowel  obstruction) ([place]) 08/17/2020  . Fall 01/25/2020  . Other fatigue 01/03/2020  . Left shoulder pain 01/03/2020  . Urinary frequency 01/03/2020  . Acute otalgia, left 09/06/2019  . Unintentional weight loss 07/18/2019  . Personal history of PE (pulmonary embolism) 04/23/2019  . Headache 02/11/2019  . Iron deficiency anemia 12/21/2018  . Cold sore 10/23/2018  . Blood in stool 09/14/2018  . 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 Therapeutic drug monitoring 07/17/2018  . Chronic diastolic CHF (congestive heart failure) ([place]) 12/19/2017  . Rash 08/11/2017  . Chronic respiratory failure with hypoxia ([place]) 03/30/2017  . Angular cheilitis 03/08/2017  . Leg pain 10/25/2016  . Back pain 08/05/2016  . RA (rheumatoid arthritis) ([place]) 03/31/2015  . Allergic rhinitis   . Varicose vein 09/11/2014  . ILD (interstitial lung disease) ([place]) 06/25/2012  . Chest pain 02/27/2012  . CAD (coronary artery disease) 02/27/2012  . Pruritus 08/18/2009  . Postinflammatory pulmonary fibrosis / RA ILD  06/30/2008  . Constipation 01/03/2008  . Dyslipidemia 06/16/2007  . Personal history of DVT (deep vein thrombosis) 06/16/2007   -Will start sips of liquids today and see how she is doing; BID miralax -MIVF until reliably tolerating PO -We will follow with you    LOS:  1 day   Nadeen Landau, MD [facility] Use [email or web address] to contact on call provider

## 2020-08-18 NOTE — Evaluation (Signed)
Physical Therapy Evaluation Patient Details Name: [name] [name] MRN: [id number] DOB: [birth year] Today's Date: 08/18/2020   History of Present Illness  87 yo female admitted with SBO, UTI. Hx of falls, pulm fibrosis, chronic respiratory failure-O2 dep 2L, DVT, osteoporosis, RA, DJD, CAD, CHF  Clinical Impression  On eval, pt was Min guard assist for mobility. She walked ~15 feet x 2 with RW. Pt c/o abd pain and nausea. Pt declined hallway ambulation on today. Recommend daily mobility with nursing in addition to PT session. Will plan to follow and progress activity as tolerated.     Follow Up Recommendations Home health PT;Supervision - Intermittent    Equipment Recommendations  None recommended by PT    Recommendations for Other Services       Precautions / Restrictions Precautions Precautions: Fall Precaution Comments: O2 dep Restrictions Weight Bearing Restrictions: No      Mobility  Bed Mobility Overal bed mobility: Needs Assistance Bed Mobility: Supine to Sit;Sit to Supine     Supine to sit: Supervision;HOB elevated Sit to supine: Supervision;HOB elevated        Transfers Overall transfer level: Needs assistance Equipment used: Rolling walker (2 wheeled) Transfers: Sit to/from Stand Sit to Stand: Min guard         General transfer comment: Min guard for safety.  Ambulation/Gait Ambulation/Gait assistance: Min guard Gait Distance (Feet): 15 Feet (x2) Assistive device: Rolling walker (2 wheeled) Gait Pattern/deviations: Step-through pattern;Decreased stride length     General Gait Details: Min guard for safety. Slow gait speed.  Stairs            Wheelchair Mobility    Modified Rankin (Stroke Patients Only)       Balance Overall balance assessment: Needs assistance         Standing balance support: Bilateral upper extremity supported Standing balance-[name]: Poor                               Pertinent Vitals/Pain  Pain Assessment: Faces Faces Pain Scale: Hurts little more Pain Location: abdomen Pain Descriptors / Indicators: Discomfort;Grimacing Pain Intervention(s): Limited activity within patient's tolerance;Monitored during session    Home Living Family/patient expects to be discharged to:: Private residence   Available Help at Discharge: Personal care attendant Type of Home: House Home Access: Stairs to enter   Technical brewer of Steps: 2 Home Layout: One level Home Equipment: Cane - single point;Walker - 2 wheels      Prior Function Level of Independence: Needs assistance   Gait / Transfers Assistance Needed: independent with ambulation  ADL's / Homemaking Assistance Needed: Aide 2x/week        Hand Dominance        Extremity/Trunk Assessment   Upper Extremity Assessment Upper Extremity Assessment: Generalized weakness    Lower Extremity Assessment Lower Extremity Assessment: Generalized weakness    Cervical / Trunk Assessment Cervical / Trunk Assessment: Normal  Communication   Communication: No difficulties  Cognition Arousal/Alertness: Awake/alert Behavior During Therapy: WFL for tasks assessed/performed Overall Cognitive Status: Within Functional Limits for tasks assessed                                        General Comments      Exercises     Assessment/Plan    PT Assessment Patient needs continued PT services  PT  Problem List Decreased strength;Decreased mobility;Decreased activity tolerance;Decreased balance       PT Treatment Interventions DME instruction;Gait training;Therapeutic activities;Therapeutic exercise;Patient/family education;Balance training;Functional mobility training    PT Goals (Current goals can be found in the Care Plan section)  Acute Rehab PT Goals Patient Stated Goal: less pain PT Goal Formulation: With patient Time For Goal Achievement: 09/01/20 Potential to Achieve Goals: Good    Frequency Min  3X/week   Barriers to discharge        Co-evaluation               AM-PAC PT "6 Clicks" Mobility  Outcome Measure Help needed turning from your back to your side while in a flat bed without using bedrails?: A Little Help needed moving from lying on your back to sitting on the side of a flat bed without using bedrails?: A Little Help needed moving to and from a bed to a chair (including a wheelchair)?: A Little Help needed standing up from a chair using your arms (e.g., wheelchair or bedside chair)?: A Little Help needed to walk in hospital room?: A Little Help needed climbing 3-5 steps with a railing? : A Little 6 Click Score: 18    End of Session   Activity Tolerance: Patient tolerated treatment well Patient left: in bed;with call bell/phone within reach   PT Visit Diagnosis: Muscle weakness (generalized) (M62.81);Unsteadiness on feet (R26.81)    Time: 0768-0881 PT Time Calculation (min) (ACUTE ONLY): 23 min   Charges:   PT Evaluation $PT Eval Moderate Complexity: 1 Mod PT Treatments $Gait Training: 8-22 mins           Doreatha Massed, PT Acute Rehabilitation  Office: [phone] Pager: [phone]

## 2020-08-18 NOTE — Progress Notes (Signed)
PROGRESS NOTE    [name] [name]  RWE:[id number] DOB: Sep 23, 1930 DOA: 08/17/2020 PCP: Hoyt Koch, MD   Chief Complaint  Patient presents with  . Abdominal Pain  Brief Narrative: 87 year old female with history of chronic hypoxic respiratory failure on 2 L nasal cannula secondary to ILD/pulmonary fibrosis,rheumatoid arthritis on chronic prednisone, CAD/CHF, dyslipidemia, history of DVT, presented with nausea vomiting abdominal pain, onset 3 days prior to admission. Patient seen in the ED 2/7 reported having a bowel movement a week prior to admission in the ED found to have SBO, fecal impaction disimpacted x2 with enema, CCS was consulted for SBO and was admitted for further management.  Subjective:  Alert,awake, having CLD Had Bm after enema No nausea or vomiting or abdomen pain  Assessment & Plan:  SBO, CT in the ED showed dilated small bowel loops with probable transition zone in the pelvis concerning for distal small bowel obstruction or possibly focal ileus or enteritis, large stool in the rectum, sigmoid diverticulosis: Seen by general surgery appreciate input, had 2 bowel movement since enema, starting clear liquid diet, continue diet/advance as per CCS, twice daily MiraLAX, monitor closely.  Off IV fluids.  UTI continue ceftriaxone follow-up urine culture.  Has leukocytosis but afebrile. Recent Labs  Lab 08/17/20 0410 08/17/20 2025 08/18/20 0527  WBC 12.8* 16.5* 14.6*   Chronic hypoxic respiratory failure on 2 L [place]/ILD/pulmonary fibrosis/Rheumatoid arthritis on chronic prednisone, on immunosuppressant resume once able to take  p.o.: Continue home supplemental oxygen.  CAD/CHF/dyslipidemia: No signs of fluid overload.  Resume home diuretics and resume the medication as able to take p.o.  History of DVT not on anticoagulation due to history of falls.  Normocytic anemia no evidence of bleeding.  Still has a stable. Recent Labs  Lab 08/17/20 0410 08/17/20 2025  08/18/20 0527  HGB 11.5* 12.3 11.7*  HCT 36.6 40.5 37.6   Nutrition: Consult dietitian BMI is low at 16.5 suspect moderate to severe protein calorie malnutrition Diet Order            Diet clear liquid Room service appropriate? Yes; Fluid consistency: Thin  Diet effective now                DVT prophylaxis: enoxaparin (LOVENOX) injection 30 mg Start: 08/17/20 2200 Code Status:   Code Status: Full Code  Family Communication: plan of care discussed with patient at bedside.  Status is: Inpatient Remains inpatient appropriate because:IV treatments appropriate due to intensity of illness or inability to take PO and Inpatient level of care appropriate due to severity of illness  Dispo: The patient is from: Home              Anticipated d/c is to: tbd, obtain PT OT evaluation               Anticipated d/c date is: 2 days              Patient currently is not medically stable to d/c.   Difficult to place patient No  Consultants:see note  Procedures:see note  Culture/Microbiology Other culture-see note  Medications: Scheduled Meds: . diatrizoate meglumine-sodium  90 mL Per NG tube Once  . enoxaparin (LOVENOX) injection  30 mg Subcutaneous Q24H  . polyethylene glycol  17 g Oral BID   Continuous Infusions: . cefTRIAXone (ROCEPHIN)  IV      Antimicrobials: Anti-infectives (From admission, onward)   Start     Dose/Rate Route Frequency Ordered Stop   08/18/20 1000  cefTRIAXone (ROCEPHIN) 1  g in sodium chloride 0.9 % 100 mL IVPB        1 g 200 mL/hr over 30 Minutes Intravenous Every 24 hours 08/17/20 1414     08/17/20 0615  cefTRIAXone (ROCEPHIN) 1 g in sodium chloride 0.9 % 100 mL IVPB        1 g 200 mL/hr over 30 Minutes Intravenous  Once 08/17/20 0605 08/17/20 0801     Objective: Vitals: Today's Vitals   08/18/20 0245 08/18/20 0320 08/18/20 0348 08/18/20 0800  BP: (!) 100/57     Pulse: 94     Resp: 16     Temp: 99.1 F (37.3 C)     TempSrc: Oral     SpO2: 93%      Weight:      Height:      PainSc:  10-Worst pain ever Asleep 10-Worst pain ever    Intake/Output Summary (Last 24 hours) at 08/18/2020 1012 Last data filed at 08/17/2020 1617 Gross per 24 hour  Intake 18.68 ml  Output --  Net 18.68 ml   Filed Weights   08/17/20 0326  Weight: 44.5 kg   Weight change:   Intake/Output from previous day: 02/07 0701 - 02/08 0700 In: 118.7 [I.V.:18.7; IV Piggyback:100] Out: -  Intake/Output this shift: No intake/output data recorded. Filed Weights   08/17/20 0326  Weight: 44.5 kg    Examination: General exam: AAOx3, ill frail, HEENT:Oral mucosa moist, Ear/Nose WNL grossly,dentition normal. Respiratory system: bilaterally CLEAR,no wheezing or crackles,no use of accessory muscle, non tender. Cardiovascular system: S1 & S2 +, regular, No JVD. Gastrointestinal system: Abdomen soft, NT,ND, BS+/sluggish. Nervous System:Alert, awake, moving extremities and grossly nonfocal Extremities: No edema, distal peripheral pulses palpable.  Skin: No rashes,no icterus. MSK: Normal muscle bulk,tone, power   Data Reviewed: I have personally reviewed following labs and imaging studies CBC: Recent Labs  Lab 08/17/20 0410 08/17/20 2025 08/18/20 0527  WBC 12.8* 16.5* 14.6*  NEUTROABS 8.9*  --   --   HGB 11.5* 12.3 11.7*  HCT 36.6 40.5 37.6  MCV 98.7 101.3* 100.3*  PLT 291 307 500   Basic Metabolic Panel: Recent Labs  Lab 08/17/20 0410 08/17/20 2025 08/18/20 0527  NA 139  --  139  K 4.1  --  4.0  CL 98  --  98  CO2 31  --  28  GLUCOSE 110*  --  105*  BUN 13  --  14  CREATININE 0.87 0.70 0.80  CALCIUM 9.5  --  9.3   GFR: Estimated Creatinine Clearance: 34.8 mL/min (by C-G formula based on SCr of 0.8 mg/dL). Liver Function Tests: Recent Labs  Lab 08/17/20 0410 08/18/20 0527  AST 25 20  ALT 17 15  ALKPHOS 78 75  BILITOT 1.0 1.0  PROT 7.1 6.9  ALBUMIN 3.4* 3.4*   Recent Labs  Lab 08/17/20 0410  LIPASE 27   No results for input(s):  AMMONIA in the last 168 hours. Coagulation Profile: No results for input(s): INR, PROTIME in the last 168 hours. Cardiac Enzymes: No results for input(s): CKTOTAL, CKMB, CKMBINDEX, TROPONINI in the last 168 hours. BNP (last 3 results) Recent Labs    06/03/20 1001  PROBNP 100   HbA1C: No results for input(s): HGBA1C in the last 72 hours. CBG: No results for input(s): GLUCAP in the last 168 hours. Lipid Profile: No results for input(s): CHOL, HDL, LDLCALC, TRIG, CHOLHDL, LDLDIRECT in the last 72 hours. Thyroid Function Tests: No results for input(s): TSH, T4TOTAL, FREET4, T3FREE,  THYROIDAB in the last 72 hours. Anemia Panel: Recent Labs    08/17/20 1405  TIBC 282  IRON 23*   Sepsis Labs: No results for input(s): PROCALCITON, LATICACIDVEN in the last 168 hours.  Recent Results (from the past 240 hour(s))  SARS CORONAVIRUS 2 (TAT 6-24 HRS) Nasopharyngeal Nasopharyngeal Swab     Status: None   Collection Time: 08/17/20 12:28 PM   Specimen: Nasopharyngeal Swab  Result Value Ref Range Status   SARS Coronavirus 2 NEGATIVE NEGATIVE Final    Comment: (NOTE) SARS-CoV-2 target nucleic acids are NOT DETECTED.  The SARS-CoV-2 RNA is generally detectable in upper and lower respiratory specimens during the acute phase of infection. Negative results do not preclude SARS-CoV-2 infection, do not rule out co-infections with other pathogens, and should not be used as the sole basis for treatment or other patient management decisions. Negative results must be combined with clinical observations, patient history, and epidemiological information. The expected result is Negative.  Fact Sheet for Patients: SugarRoll.be  Fact Sheet for Healthcare Providers: https://www.woods-mathews.com/  This test is not yet approved or cleared by the [place] FDA and  has been authorized for detection and/or diagnosis of SARS-CoV-2 by FDA under an Emergency  Use Authorization (EUA). This EUA will remain  in effect (meaning this test can be used) for the duration of the COVID-19 declaration under Se ction 564(b)(1) of the Act, 21 U.S.C. section 360bbb-3(b)(1), unless the authorization is terminated or revoked sooner.  Performed at [facility], [place] [address], [place], [place] [address]      Radiology Studies: CT ABDOMEN PELVIS W CONTRAST  Result Date: 08/17/2020 CLINICAL DATA:  Acute generalized abdominal pain. EXAM: CT ABDOMEN AND PELVIS WITH CONTRAST TECHNIQUE: Multidetector CT imaging of the abdomen and pelvis was performed using the standard protocol following bolus administration of intravenous contrast. CONTRAST:  14mL OMNIPAQUE IOHEXOL 300 MG/ML  SOLN COMPARISON:  September 28, 2015. FINDINGS: Lower chest: Stable chronic cystic changes seen involving the visualized lung bases as noted on prior exam. Hepatobiliary: No gallstones are noted. Stable intrahepatic and extrahepatic biliary dilatation is noted. Liver is unremarkable. Pancreas: Unremarkable. No pancreatic ductal dilatation or surrounding inflammatory changes. Spleen: Normal in size without focal abnormality. Adrenals/Urinary Tract: Adrenal glands are unremarkable. Kidneys are normal, without renal calculi, focal lesion, or hydronephrosis. Bladder is unremarkable. Stomach/Bowel: The stomach appears normal. Large amount of stool seen in the rectum concerning for impaction. Sigmoid diverticulosis is noted without inflammation. Dilated small bowel loops are noted in the pelvis with probable transition zone seen in the pelvis, concerning for distal small bowel obstruction or possibly focal ileus or enteritis. Vascular/Lymphatic: Aortic atherosclerosis. No enlarged abdominal or pelvic lymph nodes. Reproductive: Status post hysterectomy. No adnexal masses. Other: No abdominal wall hernia or abnormality. No abdominopelvic ascites. Musculoskeletal: No acute or significant osseous findings.  IMPRESSION: 1. Dilated small bowel loops are noted in the pelvis with probable transition zone seen in the pelvis, concerning for distal small bowel obstruction or possibly focal ileus or enteritis. 2. Large amount of stool seen in the rectum concerning for impaction. 3. Sigmoid diverticulosis without inflammation. 4. Stable intrahepatic and extrahepatic biliary dilatation is noted. 5. Aortic atherosclerosis. Aortic Atherosclerosis (ICD10-I70.0). Electronically Signed   By: Marijo Conception M.D.   On: 08/17/2020 08:29   DG Abd Portable 1V  Result Date: 08/18/2020 CLINICAL DATA:  Small bowel obstruction. EXAM: PORTABLE ABDOMEN - 1 VIEW COMPARISON:  CT scan 08/17/2020 FINDINGS: Supine abdomen shows no substantial change in  bowel gas pattern since scout image of yesterday's CT scan. Much of the small bowel on the prior CT was fluid-filled making it in apparent on x-ray today. There is a gas filled loop of small bowel in the left abdomen today measuring approximately 2.5 cm in diameter. Chronic interstitial changes noted in the lung bases. Bones are diffusely demineralized. IMPRESSION: No substantial change in bowel gas pattern since scout image of yesterday's CT scan with an air-filled small bowel loop in the left abdomen now measuring 2.5 cm in diameter. Electronically Signed   By: Misty Stanley M.D.   On: 08/18/2020 07:45     LOS: 1 day   Antonieta Pert, MD [facility]  08/18/2020, 10:12 AM

## 2020-08-19 ENCOUNTER — Inpatient Hospital Stay (HOSPITAL_COMMUNITY): Payer: Medicare Other

## 2020-08-19 ENCOUNTER — Other Ambulatory Visit: Payer: Self-pay | Admitting: *Deleted

## 2020-08-19 DIAGNOSIS — K56609 Unspecified intestinal obstruction, unspecified as to partial versus complete obstruction: Secondary | ICD-10-CM | POA: Diagnosis not present

## 2020-08-19 LAB — BASIC METABOLIC PANEL
Anion gap: 15 (ref 5–15)
BUN: 20 mg/dL (ref 8–23)
CO2: 29 mmol/L (ref 22–32)
Calcium: 9.4 mg/dL (ref 8.9–10.3)
Chloride: 97 mmol/L — ABNORMAL LOW (ref 98–111)
Creatinine, Ser: 0.84 mg/dL (ref 0.44–1.00)
GFR, Estimated: >60 mL/min (ref >60)
Glucose, Bld: 108 mg/dL — ABNORMAL HIGH (ref 70–99)
Potassium: 4.1 mmol/L (ref 3.5–5.1)
Sodium: 141 mmol/L (ref 135–145)

## 2020-08-19 LAB — CBC
HCT: 37.8 % (ref 36.0–46.0)
Hemoglobin: 11.6 g/dL — ABNORMAL LOW (ref 12.0–15.0)
MCH: 30.9 pg (ref 26.0–34.0)
MCHC: 30.7 g/dL (ref 30.0–36.0)
MCV: 100.8 fL — ABNORMAL HIGH (ref 80.0–100.0)
Platelets: 292 10*3/uL (ref 150–400)
RBC: 3.75 MIL/uL — ABNORMAL LOW (ref 3.87–5.11)
RDW: 14.1 % (ref 11.5–15.5)
WBC: 12.6 10*3/uL — ABNORMAL HIGH (ref 4.0–10.5)
nRBC: 0 % (ref 0.0–0.2)

## 2020-08-19 MED ORDER — BOOST / RESOURCE BREEZE PO LIQD CUSTOM: 1.0000 | Freq: Three times a day (TID) | ORAL | Status: DC

## 2020-08-19 MED ORDER — FAMOTIDINE IN NACL 20-0.9 MG/50ML-% IV SOLN
20.0000 mg | INTRAVENOUS | Status: DC
  Administered 2020-08-19 – 2020-08-23 (×5): 20 mg via INTRAVENOUS
  Filled 2020-08-19 (×6): qty 50

## 2020-08-19 MED ORDER — PROMETHAZINE HCL 25 MG/ML IJ SOLN
6.2500 mg | Freq: Three times a day (TID) | INTRAMUSCULAR | Status: DC | PRN
  Filled 2020-08-19: qty 1

## 2020-08-19 MED ORDER — ADULT MULTIVITAMIN W/MINERALS CH
1.0000 | ORAL_TABLET | Freq: Every day | ORAL | Status: DC
  Filled 2020-08-19 (×2): qty 1

## 2020-08-19 MED ORDER — PANTOPRAZOLE SODIUM 40 MG IV SOLR: 40.0000 mg | INTRAVENOUS | Status: DC

## 2020-08-19 MED ORDER — ALUM & MAG HYDROXIDE-SIMETH 200-200-20 MG/5ML PO SUSP: 30.0000 mL | Freq: Four times a day (QID) | ORAL | Status: DC | PRN

## 2020-08-19 MED ORDER — BISACODYL 10 MG RE SUPP
10.0000 mg | Freq: Once | RECTAL | Status: AC
  Administered 2020-08-19: 10 mg via RECTAL
  Filled 2020-08-19: qty 1

## 2020-08-19 NOTE — Progress Notes (Signed)
Pt just vomited 200cc of light brown emesis.

## 2020-08-19 NOTE — Progress Notes (Signed)
Initial Nutrition Assessment  DOCUMENTATION CODES:   Severe malnutrition in context of chronic illness,Underweight  INTERVENTION:  Boost Breeze po TID, each supplement provides 250 kcal and 9 grams of protein  MVI with minerals daily  With diet advancement  -Ensure Enlive po BID, each supplement provides 350 kcal and 20 grams of protein (chocolate)   NUTRITION DIAGNOSIS:   Severe Malnutrition related to chronic illness (ILD/pulmonary fibrosis) as evidenced by estimated needs.  GOAL:   Patient will meet greater than or equal to 90% of their needs  MONITOR:   PO intake,Weight trends,I & O's,Labs,Diet advancement,Supplement acceptance  REASON FOR ASSESSMENT:   Consult Assessment of nutrition requirement/status  ASSESSMENT:  87 year old female with history of chronic respiratory failure on 2 L O2 secondary to ILD/pulmonary fibrosis, RA on chronic prednisone, CAD/CHF, HLD, history of DVT who presented with nausea, vomiting, abdominal pain over the past 3 days. Pt found to have fecal impaction s/p disimpaction x 2 with enema and admitted for SBO.  Pt awake, resting quietly in bed this afternoon. She reports tolerating clear lunch tray, no episodes of vomiting since this morning. She reports 3 weeks of decreased appetite due to abdominal pain, states she thought it would go away, but then it got worse. Pt recalls usual good appetite and intake, eats 3 meals/day (breakfast: fruit, boiled egg, oatmeal; lunch: salad with protein or sandwich; dinner: regular meat and 2 veggies). She drinks chocolate or berry flavored Ensure, and "health drinks" that her nephew gets for her. She endorses wt loss, recalls usual wt around 140 lbs. Per chart her wts have stable over the last 7 months and have decreased 5.5 lbs (5.3%) in the last 3.5 weeks which is significant. She is agreeable to trying Boost Breeze while on clear liquids and chocolate Ensure with diet advancement to help her meet her needs.    Medications reviewed and include: Gastrografin, Remeron, Cellcept, Ofev, Miralax, Prednisone, IV Rocephin 1 g every 24 hours, IV Pepcid 20 mg every 24 hrs  Labs: WBC 12.6 (H)  NUTRITION - FOCUSED PHYSICAL EXAM:  Flowsheet Row Most Recent Value  Orbital Region Severe depletion  Upper Arm Region Severe depletion  Thoracic and Lumbar Region Severe depletion  Buccal Region Severe depletion  Temple Region Severe depletion  Clavicle Bone Region Severe depletion  Clavicle and Acromion Bone Region Severe depletion  Scapular Bone Region Unable to assess  Dorsal Hand Severe depletion  Patellar Region Severe depletion  Anterior Thigh Region Severe depletion  Posterior Calf Region Severe depletion  Edema (RD Assessment) None  Hair Reviewed  Eyes Reviewed  Mouth Reviewed  Skin Reviewed  Nails Reviewed       Diet Order:   Diet Order            Diet clear liquid Room service appropriate? Yes; Fluid consistency: Thin  Diet effective now                 EDUCATION NEEDS:   Education needs have been addressed  Skin:  Skin Assessment: Reviewed RN Assessment  Last BM:  2/7-type 3;type 4 (brown;medium)  Height:   Ht Readings from Last 1 Encounters:  08/17/20 5' 4.5" (1.638 m)    Weight:   Wt Readings from Last 1 Encounters:  08/17/20 44.5 kg   BMI:  Body mass index is 16.56 kg/m.  Estimated Nutritional Needs:   Kcal:  1560-1780  Protein:  71-80  Fluid:  >1.2 L    Lajuan Lines, RD, LDN Clinical Nutrition  After Hours/Weekend Pager # in [employer]

## 2020-08-19 NOTE — Evaluation (Signed)
Occupational Therapy Evaluation Patient Details Name: [name] [name] MRN: [id number] DOB: 09-Sep-1930 Today's Date: 08/19/2020    History of Present Illness 87 yo female admitted with SBO, UTI. Hx of falls, pulm fibrosis, chronic respiratory failure-O2 dep 2L, DVT, osteoporosis, RA, DJD, CAD, CHF   Clinical Impression   Pt admitted with the above diagnoses and presents with below problem list. Pt will benefit from continued acute OT to address the below listed deficits and maximize independence with basic ADLs prior to d/c to venue below. At baseline, pt is independent with basic ADLs. Pt limited by nausea and pain this session. Currently needs setup to min assist with UB ADLs, up to min A with LB ADLs. Per chart review she was able to walk a bit with PT yesterday. Encouraged general UB/LB strengthening exercises as tolerated between therapy sessions. Optimistic that as pt's symptoms improve she will progress well with acute OT goals and be able to d/c home.      Follow Up Recommendations  Home health OT;Supervision - Intermittent    Equipment Recommendations  3 in 1 bedside commode    Recommendations for Other Services       Precautions / Restrictions Precautions Precautions: Fall Precaution Comments: O2 dep Restrictions Weight Bearing Restrictions: No      Mobility Bed Mobility Overal bed mobility: Needs Assistance Bed Mobility: Supine to Sit;Sit to Supine     Supine to sit: Supervision;HOB elevated Sit to supine: Supervision;HOB elevated   General bed mobility comments: extra time and effort, no physical assist needed.    Transfers                 General transfer comment: unable to assess on OT eval 2/2 nausea and pain    Balance Overall balance assessment: Needs assistance Sitting-balance support: No upper extremity supported;Feet unsupported Sitting balance-[name]: Fair Sitting balance - Comments: able to sit unsupported EOB 1-2 minutes                                    ADL either performed or assessed with clinical judgement   ADL Overall ADL's : Needs assistance/impaired Eating/Feeding: Set up;Sitting   Grooming: Set up;Sitting   Upper Body Bathing: Minimal assistance;Sitting   Lower Body Bathing: Minimal assistance;Sit to/from stand   Upper Body Dressing : Set up;Sitting   Lower Body Dressing: Minimal assistance;Sit to/from stand                 General ADL Comments: Pt limited by nausea and abdominal pain. Able to sit EOB unsupported for a minute or two before needing to lie back down. LB ADL current assist levels per chart review and clinical judgement     Vision         Perception     Praxis      Pertinent Vitals/Pain Pain Assessment: Faces Faces Pain Scale: Hurts even more Pain Location: abdomen Pain Descriptors / Indicators: Discomfort;Grimacing;Guarding Pain Intervention(s): Monitored during session;Limited activity within patient's tolerance;Repositioned     Hand Dominance Right   Extremity/Trunk Assessment Upper Extremity Assessment Upper Extremity Assessment: Generalized weakness   Lower Extremity Assessment Lower Extremity Assessment: Defer to PT evaluation       Communication Communication Communication: No difficulties   Cognition Arousal/Alertness: Awake/alert Behavior During Therapy: WFL for tasks assessed/performed Overall Cognitive Status: Within Functional Limits for tasks assessed  General Comments       Exercises     Shoulder Instructions      Home Living Family/patient expects to be discharged to:: Private residence Living Arrangements: Other relatives Available Help at Discharge: Personal care attendant Type of Home: House Home Access: Stairs to enter [employer] of Steps: 2   Home Layout: One level     Bathroom Shower/Tub: Occupational psychologist: Handicapped height      Home Equipment: [place] - single point;Walker - 2 wheels          Prior Functioning/Environment Level of Independence: Needs assistance  Gait / Transfers Assistance Needed: independent with ambulation ADL's / Homemaking Assistance Needed: Aide 2x/week            OT Problem List: Decreased strength;Decreased activity tolerance;Impaired balance (sitting and/or standing);Decreased knowledge of use of DME or AE;Decreased knowledge of precautions;Pain      OT Treatment/Interventions: Self-care/ADL training;Therapeutic exercise;Energy conservation;DME and/or AE instruction;Therapeutic activities;Patient/family education;Balance training    OT Goals(Current goals can be found in the care plan section) Acute Rehab OT Goals Patient Stated Goal: reduce nausea and pain OT Goal Formulation: With patient Time For Goal Achievement: 09/02/20 Potential to Achieve Goals: Good ADL Goals Pt Will Perform Lower Body Bathing: with modified independence;sit to/from stand Pt Will Perform Lower Body Dressing: with modified independence;sit to/from stand Pt Will Transfer to Toilet: with supervision;ambulating Pt Will Perform Toileting - Clothing Manipulation and hygiene: with modified independence;sit to/from stand  OT Frequency: Min 2X/week   Barriers to D/C:            Co-evaluation              AM-PAC OT "6 Clicks" Daily Activity     Outcome Measure Help from another person eating meals?: None Help from another person taking care of personal grooming?: A Little Help from another person toileting, which includes using toliet, bedpan, or urinal?: A Little Help from another person bathing (including washing, rinsing, drying)?: A Little Help from another person to put on and taking off regular upper body clothing?: A Little Help from another person to put on and taking off regular lower body clothing?: A Little 6 Click Score: 19   End of Session Equipment Utilized During Treatment:  Oxygen Nurse Communication: Other (comment) (nausea and pain)  Activity Tolerance: Patient limited by pain;Other (comment) (nausea) Patient left: in bed;with call bell/phone within reach;with bed alarm set  OT Visit Diagnosis: Unsteadiness on feet (R26.81);Pain;Muscle weakness (generalized) (M62.81)                Time: 7209-4709 OT Time Calculation (min): 13 min Charges:  OT General Charges $OT Visit: 1 Visit OT Evaluation $OT Eval Low Complexity: [place], OT Acute Rehabilitation Services Pager: [phone] Office: [phone]   Hortencia Pilar 08/19/2020, 12:39 PM

## 2020-08-19 NOTE — Progress Notes (Addendum)
PROGRESS NOTE    [name] [name]  HFW:[id number] DOB: [birth year] DOA: 08/17/2020 PCP: Hoyt Koch, MD   Chief Complaint  Patient presents with  . Abdominal Pain  Brief Narrative: 87 year old female with history of chronic hypoxic respiratory failure on 2 L nasal cannula secondary to ILD/pulmonary fibrosis,rheumatoid arthritis on chronic prednisone, CAD/CHF, dyslipidemia, history of DVT, presented with nausea vomiting abdominal pain, onset 3 days prior to admission. Patient seen in the ED 2/7 reported having a bowel movement a week prior to admission in the ED found to have SBO, fecal impaction disimpacted x2 with enema, CCS was consulted for SBO and was admitted for further management. 2/8- started on CLD  Subjective:  Seen and examined this morning.  Patient complains of ongoing nausea all morning.  She vomited this morning She was feeling fine when she was seen by [facility]CS this morning. Family-Nephew is at the bedside  Assessment & Plan:  SBO, CT in the ED showed dilated small bowel loops with probable transition zone in the pelvis concerning for distal small bowel obstruction or possibly focal ileus or enteritis, large stool in the rectum, sigmoid diverticulosis: Patient had two BM after enema in the ED.  Placed on clear liquid diet 2/8.  But this morning having nausea and vomited two times.  Will notify [facility]CS we may need to downgrade her diet, keep on IV fluids, Zofran, Pepcid IV, prn maalox. X-ray abdomen done this morning was Negative.  I have notified the surgery PA this am re recurrent vomiting-will change diet to ice chips only.  Add Phenergan for refractory vomiting.  UTI continue ceftriaxone.  Leukocytosis is downtrending.  Recent Labs  Lab 08/17/20 0410 08/17/20 2025 08/18/20 0527 08/19/20 0614  WBC 12.8* 16.5* 14.6* 12.6*   Chronic hypoxic respiratory failure on 2 L [place]/ILD/pulmonary fibrosis/Rheumatoid arthritis on chronic prednisone, on immunosuppressant : Continue home  meds as tolerated if not can transition to IV steroid.  Continue supplemental oxygen.    CAD/CHF/dyslipidemia: No signs of fluid overload.  At this time needing IV fluid hydration diuretics on hold.cont statins, po meds as tolerated.  History of DVT not on anticoagulation due to history of falls.  Normocytic anemia no evidence of bleeding.  Hemoglobin stable. Recent Labs  Lab 08/17/20 0410 08/17/20 2025 08/18/20 0527 08/19/20 0614  HGB 11.5* 12.3 11.7* 11.6*  HCT 36.6 40.5 37.6 37.8   Goals of care full code.  Multiple comorbidities debilitated.  Will consult palliative care.  Nutrition Status:Consult dietitian as BMI is low at 16.5 suspect degree of protein calorie malnutrition        Diet Order            Diet clear liquid Room service appropriate? Yes; Fluid consistency: Thin  Diet effective now                DVT prophylaxis: enoxaparin (LOVENOX) injection 30 mg Start: 08/17/20 2200 Code Status:   Code Status: Full Code  Family Communication: plan of care discussed with patient at bedside. Discussed with patient's nephew at the bedside.  Status is: Inpatient Remains inpatient appropriate because:IV treatments appropriate due to intensity of illness or inability to take PO and Inpatient level of care appropriate due to severity of illness  Dispo: The patient is from: Home              Anticipated d/c is to: tbd, encourage PT OT if able to tolerate.              Anticipated  d/c date is: 2 days              Patient currently is not medically stable to d/c.   Difficult to place patient No  Consultants:see note  Procedures:see note  Culture/Microbiology Other culture-see note  Medications: Scheduled Meds: . atorvastatin  40 mg Oral Daily  . dapsone  100 mg Oral Daily  . diatrizoate meglumine-sodium  90 mL Per NG tube Once  . enoxaparin (LOVENOX) injection  30 mg Subcutaneous Q24H  . mirtazapine  15 mg Oral QHS  . mycophenolate  1,000 mg Oral BID  . Nintedanib   100 mg Oral BID  . polyethylene glycol  17 g Oral BID  . predniSONE  5 mg Oral Q breakfast   Continuous Infusions: . cefTRIAXone (ROCEPHIN)  IV 1 g (08/19/20 0954)  . famotidine (PEPCID) IV 20 mg (08/19/20 1047)    Antimicrobials: Anti-infectives (From admission, onward)   Start     Dose/Rate Route Frequency Ordered Stop   08/19/20 1000  dapsone tablet 100 mg       Note to Pharmacy: TAKE 1 TABLET(100 MG) BY MOUTH DAILY     100 mg Oral Daily 08/18/20 1350     08/18/20 1000  cefTRIAXone (ROCEPHIN) 1 g in sodium chloride 0.9 % 100 mL IVPB        1 g 200 mL/hr over 30 Minutes Intravenous Every 24 hours 08/17/20 1414     08/17/20 0615  cefTRIAXone (ROCEPHIN) 1 g in sodium chloride 0.9 % 100 mL IVPB        1 g 200 mL/hr over 30 Minutes Intravenous  Once 08/17/20 0605 08/17/20 0801     Objective: Vitals: Today's Vitals   08/19/20 0413 08/19/20 0440 08/19/20 0815 08/19/20 1307  BP:  128/72  (!) 141/82  Pulse:  97  (!) 105  Resp:  16  16  Temp:  99.1 F (37.3 C)  99.1 F (37.3 C)  TempSrc:  Oral  Oral  SpO2:  98%  94%  Weight:      Height:      PainSc: Asleep  0-No pain     Intake/Output Summary (Last 24 hours) at 08/19/2020 1312 Last data filed at 08/19/2020 6160 Gross per 24 hour  Intake 780 ml  Output 200 ml  Net 580 ml   Filed Weights   08/17/20 0326  Weight: 44.5 kg   Weight change:   Intake/Output from previous day: 02/08 0701 - 02/09 0700 In: 1020 [P.O.:920; IV Piggyback:100] Out: -  Intake/Output this shift: Total I/O In: 0  Out: 200 [Emesis/NG output:200] Filed Weights   08/17/20 0326  Weight: 44.5 kg    Examination:  General exam: AAOx3, ill looking, frail, older for her age, HEENT:Oral mucosa moist, Ear/Nose WNL grossly, dentition normal. Respiratory system: bilaterally diminished,no wheezing or crackles,no use of accessory muscle Cardiovascular system: S1 & S2 +, No JVD,. Gastrointestinal system: Abdomen soft, mildly distended, mild diffuse  tenderness, bowel sounds sluggish Nervous System:Alert, awake, moving extremities and grossly nonfocal Extremities: No edema, distal peripheral pulses palpable.  Skin: No rashes,no icterus. MSK: Normal muscle bulk,tone, power.  Data Reviewed: I have personally reviewed following labs and imaging studies CBC: Recent Labs  Lab 08/17/20 0410 08/17/20 2025 08/18/20 0527 08/19/20 0614  WBC 12.8* 16.5* 14.6* 12.6*  NEUTROABS 8.9*  --   --   --   HGB 11.5* 12.3 11.7* 11.6*  HCT 36.6 40.5 37.6 37.8  MCV 98.7 101.3* 100.3* 100.8*  PLT 291 307 292  341   Basic Metabolic Panel: Recent Labs  Lab 08/17/20 0410 08/17/20 2025 08/18/20 0527 08/19/20 0614  NA 139  --  139 141  K 4.1  --  4.0 4.1  CL 98  --  98 97*  CO2 31  --  28 29  GLUCOSE 110*  --  105* 108*  BUN 13  --  14 20  CREATININE 0.87 0.70 0.80 0.84  CALCIUM 9.5  --  9.3 9.4   GFR: Estimated Creatinine Clearance: 33.1 mL/min (by C-G formula based on SCr of 0.84 mg/dL). Liver Function Tests: Recent Labs  Lab 08/17/20 0410 08/18/20 0527  AST 25 20  ALT 17 15  ALKPHOS 78 75  BILITOT 1.0 1.0  PROT 7.1 6.9  ALBUMIN 3.4* 3.4*   Recent Labs  Lab 08/17/20 0410  LIPASE 27   No results for input(s): AMMONIA in the last 168 hours. Coagulation Profile: No results for input(s): INR, PROTIME in the last 168 hours. Cardiac Enzymes: No results for input(s): CKTOTAL, CKMB, CKMBINDEX, TROPONINI in the last 168 hours. BNP (last 3 results) Recent Labs    06/03/20 1001  PROBNP 100   HbA1C: No results for input(s): HGBA1C in the last 72 hours. CBG: No results for input(s): GLUCAP in the last 168 hours. Lipid Profile: No results for input(s): CHOL, HDL, LDLCALC, TRIG, CHOLHDL, LDLDIRECT in the last 72 hours. Thyroid Function Tests: No results for input(s): TSH, T4TOTAL, FREET4, T3FREE, THYROIDAB in the last 72 hours. Anemia Panel: Recent Labs    08/17/20 1405  TIBC 282  IRON 23*   Sepsis Labs: No results for  input(s): PROCALCITON, LATICACIDVEN in the last 168 hours.  Recent Results (from the past 240 hour(s))  SARS CORONAVIRUS 2 (TAT 6-24 HRS) Nasopharyngeal Nasopharyngeal Swab     Status: None   Collection Time: 08/17/20 12:28 PM   Specimen: Nasopharyngeal Swab  Result Value Ref Range Status   SARS Coronavirus 2 NEGATIVE NEGATIVE Final    Comment: (NOTE) SARS-CoV-2 target nucleic acids are NOT DETECTED.  The SARS-CoV-2 RNA is generally detectable in upper and lower respiratory specimens during the acute phase of infection. Negative results do not preclude SARS-CoV-2 infection, do not rule out co-infections with other pathogens, and should not be used as the sole basis for treatment or other patient management decisions. Negative results must be combined with clinical observations, patient history, and epidemiological information. The expected result is Negative.  Fact Sheet for Patients: SugarRoll.be  Fact Sheet for Healthcare Providers: https://www.woods-mathews.com/  This test is not yet approved or cleared by the [place] FDA and  has been authorized for detection and/or diagnosis of SARS-CoV-2 by FDA under an Emergency Use Authorization (EUA). This EUA will remain  in effect (meaning this test can be used) for the duration of the COVID-19 declaration under Se ction 564(b)(1) of the Act, 21 U.S.C. section 360bbb-3(b)(1), unless the authorization is terminated or revoked sooner.  Performed at [facility], [place] [address], [place], [place] [address]      Radiology Studies: DG Abd 1 View  Result Date: 08/19/2020 CLINICAL DATA:  Vomiting, abdominal pain. EXAM: ABDOMEN - 1 VIEW COMPARISON:  August 18, 2020. FINDINGS: The bowel gas pattern is normal. No radio-opaque calculi or other significant radiographic abnormality are seen. IMPRESSION: Negative. Electronically Signed   By: Marijo Conception M.D.   On: 08/19/2020 08:13   DG  Abd Portable 1V  Result Date: 08/18/2020 CLINICAL DATA:  Small bowel obstruction. EXAM: PORTABLE ABDOMEN -  1 VIEW COMPARISON:  CT scan 08/17/2020 FINDINGS: Supine abdomen shows no substantial change in bowel gas pattern since scout image of yesterday's CT scan. Much of the small bowel on the prior CT was fluid-filled making it in apparent on x-ray today. There is a gas filled loop of small bowel in the left abdomen today measuring approximately 2.5 cm in diameter. Chronic interstitial changes noted in the lung bases. Bones are diffusely demineralized. IMPRESSION: No substantial change in bowel gas pattern since scout image of yesterday's CT scan with an air-filled small bowel loop in the left abdomen now measuring 2.5 cm in diameter. Electronically Signed   By: Misty Stanley M.D.   On: 08/18/2020 07:45     LOS: 2 days   Antonieta Pert, MD [facility]  08/19/2020, 1:12 PM

## 2020-08-19 NOTE — Patient Outreach (Signed)
[facility]) Care Management  08/19/2020  [name] [name] [birth year] [id number]  [place] will close case due to patient being transitioned to an Embedded CM.  Plan: RN Health Coach will close case.  Emelia Loron RN, BSN [place] [phone] [name]

## 2020-08-19 NOTE — Progress Notes (Signed)
Subjective No acute events. She has had 1 episode of emesis in the last day. No nausea currently. She has taken a tray of liquids. She reports her distention in her belly has ~resolved. Denies abdominal pain  Objective: Vital signs in last 24 hours: Temp:  [98.9 F (37.2 C)-99.1 F (37.3 C)] 99.1 F (37.3 C) (02/09 0440) Pulse Rate:  [95-100] 97 (02/09 0440) Resp:  [16] 16 (02/09 0440) BP: (99-128)/(61-72) 128/72 (02/09 0440) SpO2:  [92 %-98 %] 98 % (02/09 0440) Last BM Date: 08/17/20  Intake/Output from previous day: 02/08 0701 - 02/09 0700 In: 1020 [P.O.:920; IV Piggyback:100] Out: -  Intake/Output this shift: No intake/output data recorded.  Gen: NAD, comfortable CV: RRR Pulm: Normal work of breathing Abd: Soft flat abdomen, NT/ND. No rebound nor guarding. Ext: SCDs in place  Lab Results: CBC  Recent Labs    08/18/20 0527 08/19/20 0614  WBC 14.6* 12.6*  HGB 11.7* 11.6*  HCT 37.6 37.8  PLT 292 292   BMET Recent Labs    08/18/20 0527 08/19/20 0614  NA 139 141  K 4.0 4.1  CL 98 97*  CO2 28 29  GLUCOSE 105* 108*  BUN 14 20  CREATININE 0.80 0.84  CALCIUM 9.3 9.4   PT/INR No results for input(s): LABPROT, INR in the last 72 hours. ABG No results for input(s): PHART, HCO3 in the last 72 hours.  Invalid input(s): PCO2, PO2  Studies/Results:  Anti-infectives: Anti-infectives (From admission, onward)   Start     Dose/Rate Route Frequency Ordered Stop   08/19/20 1000  dapsone tablet 100 mg       Note to Pharmacy: TAKE 1 TABLET(100 MG) BY MOUTH DAILY     100 mg Oral Daily 08/18/20 1350     08/18/20 1000  cefTRIAXone (ROCEPHIN) 1 g in sodium chloride 0.9 % 100 mL IVPB        1 g 200 mL/hr over 30 Minutes Intravenous Every 24 hours 08/17/20 1414     08/17/20 0615  cefTRIAXone (ROCEPHIN) 1 g in sodium chloride 0.9 % 100 mL IVPB        1 g 200 mL/hr over 30 Minutes Intravenous  Once 08/17/20 0973 08/17/20 0801       Assessment/Plan: Patient Active  Problem List   Diagnosis Date Noted  . SBO (small bowel obstruction) ([place]) 08/17/2020  . Fall 01/25/2020  . Other fatigue 01/03/2020  . Left shoulder pain 01/03/2020  . Urinary frequency 01/03/2020  . Acute otalgia, left 09/06/2019  . Unintentional weight loss 07/18/2019  . Personal history of PE (pulmonary embolism) 04/23/2019  . Headache 02/11/2019  . Iron deficiency anemia 12/21/2018  . Cold sore 10/23/2018  . Blood in stool 09/14/2018  . 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 Therapeutic drug monitoring 07/17/2018  . Chronic diastolic CHF (congestive heart failure) ([place]) 12/19/2017  . Rash 08/11/2017  . Chronic respiratory failure with hypoxia ([place]) 03/30/2017  . Angular cheilitis 03/08/2017  . Leg pain 10/25/2016  . Back pain 08/05/2016  . RA (rheumatoid arthritis) ([place]) 03/31/2015  . Allergic rhinitis   . Varicose vein 09/11/2014  . ILD (interstitial lung disease) ([place]) 06/25/2012  . Chest pain 02/27/2012  . CAD (coronary artery disease) 02/27/2012  . Pruritus 08/18/2009  . Postinflammatory pulmonary fibrosis / RA ILD  06/30/2008  . Constipation 01/03/2008  . Dyslipidemia 06/16/2007  . Personal history of DVT (deep vein thrombosis) 06/16/2007   -Continue liquids; BID miralax -Dulcolax suppository -MIVF until reliably tolerating PO -We will follow  with you    LOS: 2 days   Nadeen Landau, MD [facility] Use [email or web address] to contact on call provider

## 2020-08-20 DIAGNOSIS — E43 Unspecified severe protein-calorie malnutrition: Secondary | ICD-10-CM | POA: Diagnosis not present

## 2020-08-20 LAB — CBC
HCT: 42.6 % (ref 36.0–46.0)
Hemoglobin: 12.9 g/dL (ref 12.0–15.0)
MCH: 30.6 pg (ref 26.0–34.0)
MCHC: 30.3 g/dL (ref 30.0–36.0)
MCV: 101.2 fL — ABNORMAL HIGH (ref 80.0–100.0)
Platelets: 310 10*3/uL (ref 150–400)
RBC: 4.21 MIL/uL (ref 3.87–5.11)
RDW: 14 % (ref 11.5–15.5)
WBC: 13.4 10*3/uL — ABNORMAL HIGH (ref 4.0–10.5)
nRBC: 0 % (ref 0.0–0.2)

## 2020-08-20 LAB — BASIC METABOLIC PANEL
Anion gap: 17 — ABNORMAL HIGH (ref 5–15)
BUN: 31 mg/dL — ABNORMAL HIGH (ref 8–23)
CO2: 27 mmol/L (ref 22–32)
Calcium: 9.6 mg/dL (ref 8.9–10.3)
Chloride: 98 mmol/L (ref 98–111)
Creatinine, Ser: 0.91 mg/dL (ref 0.44–1.00)
GFR, Estimated: >60 mL/min (ref >60)
Glucose, Bld: 94 mg/dL (ref 70–99)
Potassium: 3.9 mmol/L (ref 3.5–5.1)
Sodium: 142 mmol/L (ref 135–145)

## 2020-08-20 MED ORDER — SODIUM CHLORIDE 0.9 % IV SOLN: INTRAVENOUS | Status: AC

## 2020-08-20 MED ORDER — BISACODYL 10 MG RE SUPP
10.0000 mg | Freq: Every day | RECTAL | Status: DC
  Administered 2020-08-20 – 2020-09-07 (×15): 10 mg via RECTAL
  Filled 2020-08-20 (×16): qty 1

## 2020-08-20 MED ORDER — SORBITOL 70 % SOLN
960.0000 mL | TOPICAL_OIL | Freq: Once | ORAL | Status: AC
  Administered 2020-08-20: 960 mL via RECTAL
  Filled 2020-08-20: qty 473

## 2020-08-20 NOTE — Progress Notes (Addendum)
I spoke and updated patient's nephew few times today. I updated Nephew abt [facility] recs/plan,plan abt enema today, repeat ct abd tomorrow. He would like second opinion at [facility] and. I called [facility] transfer center ( I was told they are at capacity and may not be able to accept, but willing to review/consider transfer) and I left patient's details recent labs, vitals as well as clinical summary w/ transfer coordinator-30 mins later I got a call back from [facility] to [employer]- called radiology Tech who will power share images- awaiting to hear back from [facility]. Consulted GI as he is also requesting GI eval and it is reasonable since she is having on and off vomiting in am and GI will see her too  RN reports no more vomiting since this am and vitals are stable. I also spoke w/ Dr Dema Severin regarding further plan and asked him to update/discuss  With patient's nephew.[facility] will contact him in rounds. Patient is resting well. Addendum 7.45pm Called by [facility] later in after hours-They are not able to accept patient.

## 2020-08-20 NOTE — Care Management Important Message (Signed)
Important Message  Patient Details IM Letter given to the Patient. Name: [name] [name] MRN: [id number] Date of Birth: 1930/12/16   Medicare Important Message Given:  Yes     Kerin Salen 08/20/2020, 10:32 AM

## 2020-08-20 NOTE — Progress Notes (Signed)
Physical Therapy Treatment Patient Details Name: [name] [name] MRN: [id number] DOB: 31-May-1931 Today's Date: 08/20/2020    History of Present Illness 87 yo female admitted with SBO, UTI. Hx of falls, pulm fibrosis, chronic respiratory failure-O2 dep 2L, DVT, osteoporosis, RA, DJD, CAD, CHF    PT Comments    Pt is progressing with mobility, she ambulated 10' + 15' with IV pole and 2L O2, SaO2 97% on 2L O2 and HR 122 walking, no loss of balance. Encouraged pt to start ambulating to the bathroom rather than using external catheter in the bed, to facilitate increased activity level and minimize deconditioning.    Follow Up Recommendations  Home health PT;Supervision - Intermittent; supervision for mobility     Equipment Recommendations  None recommended by PT    Recommendations for Other Services       Precautions / Restrictions Precautions Precautions: Fall Precaution Comments: O2 dep, reports 2 falls in past 6 months (stated related to a medication change that has been resolved) Restrictions Weight Bearing Restrictions: No    Mobility  Bed Mobility Overal bed mobility: Needs Assistance Bed Mobility: Supine to Sit     Supine to sit: Supervision;HOB elevated     General bed mobility comments: extra time and effort, no physical assist needed, used rail    Transfers Overall transfer level: Needs assistance Equipment used: Rolling walker (2 wheeled) Transfers: Sit to/from Stand Sit to Stand: Min assist         General transfer comment: min A to power up, VCs hand placement  Ambulation/Gait Ambulation/Gait assistance: Min guard Gait Distance (Feet): 25 Feet (10' + 15') Assistive device: IV Pole Gait Pattern/deviations: Step-through pattern;Decreased stride length Gait velocity: decr, 10' bed to bathroom, then 15' to recliner; ambulated with 2L O2, SaO2 97% walking, HR 122 walking, no dyspnea   General Gait Details: Min guard for safety. Slow gait  speed.   Stairs             Wheelchair Mobility    Modified Rankin (Stroke Patients Only)       Balance Overall balance assessment: Needs assistance Sitting-balance support: No upper extremity supported;Feet unsupported Sitting balance-[name]: Good Sitting balance - Comments: able to sit unsupported EOB 1-2 minutes     Standing balance-[name]: Fair                              Cognition Arousal/Alertness: Awake/alert Behavior During Therapy: WFL for tasks assessed/performed Overall Cognitive Status: Within Functional Limits for tasks assessed                                        Exercises      General Comments        Pertinent Vitals/Pain Pain Assessment: Faces Faces Pain Scale: Hurts little more Pain Location: abdomen Pain Descriptors / Indicators: Discomfort;Grimacing;Guarding Pain Intervention(s): Limited activity within patient's tolerance;Monitored during session    Home Living                      Prior Function            PT Goals (current goals can now be found in the care plan section) Acute Rehab PT Goals Patient Stated Goal: reduce nausea and pain PT Goal Formulation: With patient Time For Goal Achievement: 09/01/20 Potential to Achieve Goals: Good  Progress towards PT goals: Progressing toward goals    Frequency    Min 3X/week      PT Plan Current plan remains appropriate    Co-evaluation              AM-PAC PT "6 Clicks" Mobility   Outcome Measure  Help needed turning from your back to your side while in a flat bed without using bedrails?: A Little Help needed moving from lying on your back to sitting on the side of a flat bed without using bedrails?: A Little Help needed moving to and from a bed to a chair (including a wheelchair)?: A Little Help needed standing up from a chair using your arms (e.g., wheelchair or bedside chair)?: A Little Help needed to walk in  hospital room?: A Little Help needed climbing 3-5 steps with a railing? : A Little 6 Click Score: 18    End of Session Equipment Utilized During Treatment: Oxygen Activity Tolerance: Patient tolerated treatment well Patient left: with call bell/phone within reach;in chair;with chair alarm set Nurse Communication: Mobility status PT Visit Diagnosis: Muscle weakness (generalized) (M62.81);Unsteadiness on feet (R26.81)     Time: 0981-1914 PT Time Calculation (min) (ACUTE ONLY): 24 min  Charges:  $Gait Training: 8-22 mins $Therapeutic Activity: 8-22 mins                    Blondell Reveal Kistler PT 08/20/2020  Acute Rehabilitation Services Pager [phone] Office [phone]

## 2020-08-20 NOTE — Progress Notes (Signed)
Subjective:  Patient ID: [name] [name], female    DOB: 05/30/1931,  MRN: [id number]  [name] [name] presents to clinic today for painful callus(es) b/l feet and painful thick toenails that are difficult to trim. Pain interferes with ambulation. Aggravating factors include wearing enclosed shoe gear. Pain is relieved with periodic professional debridement.  She states she has been trying to cut her toenails and may have cut a couple of her toes.  Review of Systems: Negative except as noted in the HPI. Past Medical History:  Diagnosis Date  . Allergic rhinitis   . Allergy   . CAD (coronary artery disease)    Mild CAD by cath 2008  . CHF (congestive heart failure) ([place])   . DJD (degenerative joint disease)    rheumatoid  . DVT (deep venous thrombosis) ([place])   . Dyslipidemia   . History of echocardiogram    Echo 12/2018: EF 60-65, mod asymmetric LVH, Gr 1 DD  . History of nuclear stress test    Myoview 5/17:  EF 74%, normal perfusion, low risk // Myoview 12/2018:  EF 89, very mild ischemia in inferoapical wall; Low Risk  . History of pulmonary embolism   . Hyperlipidemia   . Insomnia   . Osteoporosis   . Psoriasis   . Pulmonary fibrosis ([place])   . Rheumatoid arthritis [facility])    Past Surgical History:  Procedure Laterality Date  . ABDOMINAL HYSTERECTOMY  1974  . APPENDECTOMY  1959  . CATARACT EXTRACTION    . LEFT HEART CATHETERIZATION WITH CORONARY ANGIOGRAM N/A 06/18/2013   Procedure: LEFT HEART CATHETERIZATION WITH CORONARY ANGIOGRAM;  Surgeon: Peter M Martinique, MD;  Location: [facility] [place] CATH LAB;  Service: Cardiovascular;  Laterality: N/A;  . TONSILLECTOMY  1941, 1951  . TOTAL KNEE ARTHROPLASTY  1997, 2007   Bilateral   No current facility-administered medications for this visit.  Current Outpatient Medications:  .  Nintedanib (OFEV) 100 MG CAPS, Take 1 capsule (100 mg total) by mouth 2 (two) times daily., Disp: 60 capsule, Rfl: 11  Facility-Administered Medications Ordered in Other  Visits:  .  0.9 %  sodium chloride infusion, , Intravenous, Continuous, Kc, Ramesh, MD, Last Rate: 50 mL/hr at 08/20/20 1142, New Bag at 08/20/20 1142 .  alum & mag hydroxide-simeth (MAALOX/MYLANTA) 200-200-20 MG/5ML suspension 30 mL, 30 mL, Oral, Q6H PRN, Kc, Ramesh, MD .  atorvastatin (LIPITOR) tablet 40 mg, 40 mg, Oral, Daily, Kc, Ramesh, MD, 40 mg at 08/19/20 1159 .  bisacodyl (DULCOLAX) suppository 10 mg, 10 mg, Rectal, Daily, Saverio Danker, PA-C, 10 mg at 08/20/20 0940 .  cefTRIAXone (ROCEPHIN) 1 g in sodium chloride 0.9 % 100 mL IVPB, 1 g, Intravenous, Q24H, Kyle, Tyrone A, DO, Last Rate: 200 mL/hr at 08/20/20 0940, 1 g at 08/20/20 0940 .  dapsone tablet 100 mg, 100 mg, Oral, Daily, Kc, Ramesh, MD, 100 mg at 08/19/20 1159 .  enoxaparin (LOVENOX) injection 30 mg, 30 mg, Subcutaneous, Q24H, Kyle, Tyrone A, DO, 30 mg at 08/20/20 2202 .  famotidine (PEPCID) IVPB 20 mg premix, 20 mg, Intravenous, Q24H, Kc, Ramesh, MD, Last Rate: 100 mL/hr at 08/20/20 0842, 20 mg at 08/20/20 0842 .  feeding supplement (BOOST / RESOURCE BREEZE) liquid 1 Container, 1 Container, Oral, TID BM, Kc, Ramesh, MD .  fluticasone (FLONASE) 50 MCG/ACT nasal spray 2 spray, 2 spray, Each Nare, Daily PRN, Kc, Ramesh, MD .  mirtazapine (REMERON) tablet 15 mg, 15 mg, Oral, QHS, Kc, Ramesh, MD, 15 mg at 08/19/20 2208 .  morphine 2 MG/ML injection 2 mg, 2 mg, Intravenous, Q4H PRN, Kyle, Tyrone A, DO, 2 mg at 08/19/20 2211 .  multivitamin with minerals tablet 1 tablet, 1 tablet, Oral, Daily, Antonieta Pert, MD .  Muscle Rub CREA, , Topical, PRN, Antonieta Pert, MD, Given at 08/18/20 2035 .  mycophenolate (CELLCEPT) capsule 1,000 mg, 1,000 mg, Oral, BID, Kc, Ramesh, MD, 1,000 mg at 08/19/20 2208 .  Nintedanib (Ofev) CAPS 100 mg, 100 mg, Oral, BID, Kc, Ramesh, MD .  ondansetron (ZOFRAN) tablet 4 mg, 4 mg, Oral, Q6H PRN **OR** ondansetron (ZOFRAN) injection 4 mg, 4 mg, Intravenous, Q6H PRN, Kyle, Tyrone A, DO, 4 mg at 08/19/20 2203 .   polyethylene glycol (MIRALAX / GLYCOLAX) packet 17 g, 17 g, Oral, BID, Ileana Roup, MD, 17 g at 08/19/20 2208 .  predniSONE (DELTASONE) tablet 5 mg, 5 mg, Oral, Q breakfast, Kc, Ramesh, MD, 5 mg at 08/20/20 0826 .  promethazine (PHENERGAN) injection 6.25 mg, 6.25 mg, Intravenous, Q8H PRN, Antonieta Pert, MD Allergies  Allergen Reactions  . Crestor [Rosuvastatin] Other (See Comments)    Muscle pain  . Lactose Intolerance (Gi) Other (See Comments)    Stomach upset  . Penicillins Hives  . Imuran [Azathioprine] Nausea And Vomiting   Social History   Occupational History  . Occupation: Publishing rights manager    Comment: retired  Tobacco Use  . Smoking status: Former Smoker    Packs/day: 0.50    Years: 20.00    Pack years: 10.00    Types: Cigarettes    Quit date: 07/11/1973    Years since quitting: 47.1  . Smokeless tobacco: Never Used  Vaping Use  . Vaping Use: Never used  Substance and Sexual Activity  . Alcohol use: No  . Drug use: No  . Sexual activity: Never    Objective:   Constitutional [name] [name] is a pleasant 87 y.o. African American female, WD, WN in NAD.[name] AAO x 3.   Vascular Dorsalis pedis pulses palpable bilaterally. Posterior tibial pulses palpable bilaterally.Capillary refill normal to all digits.  No cyanosis or clubbing noted. Pedal hair growth diminished b/l. Lower extremity skin temperature gradient within normal limits. No edema noted b/l lower extremities.  Neurologic Normal speech. Oriented to person, place, and time. Epicritic sensation to light touch grossly present bilaterally. Protective sensation intact 5/5 intact bilaterally with 10g monofilament b/l. Vibratory sensation intact b/l.  Dermatologic Pedal skin with normal turgor, texture and tone b/l.  Toenails are discolored, thick, elongated, dystrophic with pain on palpation x 9. Anonychia left hallux. Laceration noted distal tip left 4th toe. No interdigital macerations bilaterally.   Orthopedic: Normal muscle strength 5/5 to all lower extremity muscle groups bilaterally. Hallux valgus with bunion deformity noted b/l lower extremities. No bony tenderness.    Radiographs: None Assessment:   1. Pain due to onychomycosis of toenails of both feet    Plan:  Patient was evaluated and treated and all questions answered.  Onychomycosis with pain -Nails palliatively debridement as below -Educated on self-care  Procedure: Nail Debridement Rationale: Pain Type of Debridement: manual, sharp debridement. Instrumentation: Nail nipper, rotary burr. Number of Nails: 10 -Examined patient. -Explained her nailplate of left hallux will grown back in 3-4 months. She related understanding. -Discussed she needs to stop cutting her own toenails due to injuring her toes.  -Toenails 2-5 b/l and right hallux were debrided in length and girth with sterile nail nippers and dremel without iatrogenic bleeding.  -She was instructed to apply Neosporin  to toes once daily. -Patient to report any pedal injuries to medical professional immediately. -Patient to continue soft, supportive shoe gear daily. -Patient/POA to call should there be question/concern in the interim.  Return in about 3 months (around 11/11/2020).  Marzetta Board, DPM

## 2020-08-20 NOTE — Progress Notes (Addendum)
[facility] Progress Note     Subjective: CC-  Overall feeling better today. Some nausea over night but no further emesis since yesterday afternoon. She has tolerated some clear liquids since that time. No flatus or BM. Xray yesterday with normal bowel gas pattern.  Objective: Vital signs in last 24 hours: Temp:  [98.6 F (37 C)-99.2 F (37.3 C)] 98.6 F (37 C) (02/10 0544) Pulse Rate:  [101-108] 108 (02/10 0544) Resp:  [14-16] 14 (02/10 0544) BP: (128-141)/(76-82) 128/76 (02/10 0544) SpO2:  [91 %-96 %] 91 % (02/10 0544) Last BM Date: 08/17/20  Intake/Output from previous day: 02/09 0701 - 02/10 0700 In: 167.2 [IV Piggyback:167.2] Out: 450 [Urine:250; Emesis/NG output:200] Intake/Output this shift: No intake/output data recorded.  PE: Gen:  Alert, NAD, pleasant Pulm: rate and effort normal Abd: Soft, ND, +BS, mild subjective RLQ TTP without rebound or guarding Skin: warm and dry  Lab Results:  Recent Labs    08/19/20 0614 08/20/20 0617  WBC 12.6* 13.4*  HGB 11.6* 12.9  HCT 37.8 42.6  PLT 292 310   BMET Recent Labs    08/19/20 0614 08/20/20 0617  NA 141 142  K 4.1 3.9  CL 97* 98  CO2 29 27  GLUCOSE 108* 94  BUN 20 31*  CREATININE 0.84 0.91  CALCIUM 9.4 9.6   PT/INR No results for input(s): LABPROT, INR in the last 72 hours. CMP     Component Value Date/Time   NA 142 08/20/2020 0617   NA 138 04/29/2020 0948   K 3.9 08/20/2020 0617   CL 98 08/20/2020 0617   CO2 27 08/20/2020 0617   GLUCOSE 94 08/20/2020 0617   BUN 31 (H) 08/20/2020 0617   BUN 11 04/29/2020 0948   CREATININE 0.91 08/20/2020 0617   CREATININE 0.74 11/30/2015 1327   CALCIUM 9.6 08/20/2020 0617   PROT 6.9 08/18/2020 0527   PROT 6.5 04/29/2020 0948   ALBUMIN 3.4 (L) 08/18/2020 0527   ALBUMIN 3.8 04/29/2020 0948   AST 20 08/18/2020 0527   ALT 15 08/18/2020 0527   ALKPHOS 75 08/18/2020 0527   BILITOT 1.0 08/18/2020 0527   BILITOT 0.7 04/29/2020 0948   GFRNONAA >60  08/20/2020 0617   GFRAA 69 04/29/2020 0948   Lipase     Component Value Date/Time   LIPASE 27 08/17/2020 0410       Studies/Results: DG Abd 1 View  Result Date: 08/19/2020 CLINICAL DATA:  Vomiting, abdominal pain. EXAM: ABDOMEN - 1 VIEW COMPARISON:  August 18, 2020. FINDINGS: The bowel gas pattern is normal. No radio-opaque calculi or other significant radiographic abnormality are seen. IMPRESSION: Negative. Electronically Signed   By: Marijo Conception M.D.   On: 08/19/2020 08:13    Anti-infectives: Anti-infectives (From admission, onward)   Start     Dose/Rate Route Frequency Ordered Stop   08/19/20 1000  dapsone tablet 100 mg       Note to Pharmacy: TAKE 1 TABLET(100 MG) BY MOUTH DAILY     100 mg Oral Daily 08/18/20 1350     08/18/20 1000  cefTRIAXone (ROCEPHIN) 1 g in sodium chloride 0.9 % 100 mL IVPB        1 g 200 mL/hr over 30 Minutes Intravenous Every 24 hours 08/17/20 1414     08/17/20 0615  cefTRIAXone (ROCEPHIN) 1 g in sodium chloride 0.9 % 100 mL IVPB        1 g 200 mL/hr over 30 Minutes Intravenous  Once 08/17/20 0605 08/17/20 0801  Assessment/Plan Pulmonary fibrosis, chronic hypoxic respiratory failure, on 2LNC at home - on prednisone, cellcept, ofev, remicade CHF CAD RA H/O DVT/PE UTI - rocephin per medicine Constipation - she has had an enema and manual disimpaction x2  SBO - Xray yesterday with normal bowel gas pattern and she tolerated some clear liquids - Advance to full liquids. Continue BID miralax and daily suppositories. Mobilize, PT/OT.   ID - rocephin 2/7>> FEN - FLD, Boost VTE - SCDs, lovenox Foley - none Follow up - TBD  Addendum: Patient vomited after I saw her. Will make her NPO and give another enema. If no return in bowel function tomorrow will likely plan for repeat CT scan.   LOS: 3 days    [facility] 08/20/2020, 8:55 AM Please see Amion for pager number during day hours  7:00am-4:30pm

## 2020-08-20 NOTE — Progress Notes (Signed)
PROGRESS NOTE    [name] [name]  AST:[id number] DOB: 1931-07-03 DOA: 08/17/2020 PCP: Hoyt Koch, MD   Chief Complaint  Patient presents with  . Abdominal Pain  Brief Narrative: 87 year old female with history of chronic hypoxic respiratory failure on 2 L nasal cannula secondary to ILD/pulmonary fibrosis,rheumatoid arthritis on chronic prednisone, CAD/CHF, dyslipidemia, history of DVT, presented with nausea vomiting abdominal pain, onset 3 days prior to admission. Patient seen in the ED 2/7 reported having a bowel movement a week prior to admission in the ED found to have SBO, fecal impaction disimpacted x2 with enema, [facility] was consulted for SBO and was admitted for further management. 2/8- started on CLD  Subjective: Seen this morning.  Patient just had episode of vomiting and was just seen by her surgeon after the episode No bowel movement or flatus.  No chest pain fever chills.  Appears weak frail deconditioned.  Assessment & Plan:  SBO, CT in the ED showed dilated small bowel loops with probable transition zone in the pelvis concerning for distal small bowel obstruction or possibly focal ileus or enteritis, large stool in the rectum, sigmoid diverticulosis: After enema in ED -she had two BM. Placed on clear liquid diet 2/8.  Now having intermittent episodes of nausea and vomiting again this morning and surgery notified and aware and they are planning for enema, keeping n.p.o. and if not improving or recurrent issues CT abdomen likely tomorrow.  Given her pulmonary complications severe malnutrition she is at risk.  Will keep on ivf, prn Zofran, Pepcid IV, prn maalox. X-ray abdomen 2/9-was Negative.  UTI continue ceftriaxone.  Leukocytosis still present but afebrile.  Recent Labs  Lab 08/17/20 0410 08/17/20 2025 08/18/20 0527 08/19/20 0614 08/20/20 0617  WBC 12.8* 16.5* 14.6* 12.6* 13.4*   Chronic hypoxic respiratory failure on 2 L [place]/ILD/pulmonary fibrosis/Rheumatoid  arthritis on chronic prednisone, on immunosuppressant : Continue home meds as tolerated if not taking po meds-can ransition to IV steroid. Continue supplemental oxygen.  CAD/CHF/dyslipidemia: We will add gentle fluids due to poor oral intake.  Watch for fluid overload.  Continue home meds as tolerated.    History of DVT not on anticoagulation due to history of falls.  Normocytic anemia no evidence of bleeding.  Hemoglobin is stable e. Recent Labs  Lab 08/17/20 0410 08/17/20 2025 08/18/20 0527 08/19/20 0614 08/20/20 0617  HGB 11.5* 12.3 11.7* 11.6* 12.9  HCT 36.6 40.5 37.6 37.8 42.6   Goals of care full code.  Multiple comorbidities debilitated.  Requested palliative care consultation.   Nutrition Status: Severe protein calorie malnourishment present, augment diet as per dietitian. bmi is low at 16 Nutrition Problem: Severe Malnutrition Etiology: chronic illness (ILD/pulmonary fibrosis) Signs/Symptoms: estimated needs Interventions: MVI,Boost Plus,Refer to RD note for recommendations  Diet Order            Diet NPO time specified  Diet effective now                DVT prophylaxis: enoxaparin (LOVENOX) injection 30 mg Start: 08/17/20 2200 Code Status:   Code Status: Full Code  Family Communication: plan of care discussed with patient at bedside. Discussed with patient's nephew at the bedside yesterday.  No family today.  Status is: Inpatient Remains inpatient appropriate because:IV treatments appropriate due to intensity of illness or inability to take PO and Inpatient level of care appropriate due to severity of illness  Dispo: The patient is from: Home  Anticipated d/c is to: tbd, encourage PT OT if able to tolerate.              Anticipated d/c date is: 2 days              Patient currently is not medically stable to d/c.   Difficult to place patient No  Consultants:see note  Procedures:see note  Culture/Microbiology Other culture-see  note  Medications: Scheduled Meds: . atorvastatin  40 mg Oral Daily  . bisacodyl  10 mg Rectal Daily  . dapsone  100 mg Oral Daily  . enoxaparin (LOVENOX) injection  30 mg Subcutaneous Q24H  . feeding supplement  1 Container Oral TID BM  . mirtazapine  15 mg Oral QHS  . multivitamin with minerals  1 tablet Oral Daily  . mycophenolate  1,000 mg Oral BID  . Nintedanib  100 mg Oral BID  . polyethylene glycol  17 g Oral BID  . predniSONE  5 mg Oral Q breakfast  . sorbitol, milk of mag, mineral oil, glycerin (SMOG) enema  960 mL Rectal Once   Continuous Infusions: . sodium chloride 50 mL/hr at 08/20/20 1142  . cefTRIAXone (ROCEPHIN)  IV 1 g (08/20/20 0940)  . famotidine (PEPCID) IV 20 mg (08/20/20 4315)    Antimicrobials: Anti-infectives (From admission, onward)   Start     Dose/Rate Route Frequency Ordered Stop   08/19/20 1000  dapsone tablet 100 mg       Note to Pharmacy: TAKE 1 TABLET(100 MG) BY MOUTH DAILY     100 mg Oral Daily 08/18/20 1350     08/18/20 1000  cefTRIAXone (ROCEPHIN) 1 g in sodium chloride 0.9 % 100 mL IVPB        1 g 200 mL/hr over 30 Minutes Intravenous Every 24 hours 08/17/20 1414     08/17/20 0615  cefTRIAXone (ROCEPHIN) 1 g in sodium chloride 0.9 % 100 mL IVPB        1 g 200 mL/hr over 30 Minutes Intravenous  Once 08/17/20 0605 08/17/20 0801     Objective: Vitals: Today's Vitals   08/19/20 2216 08/19/20 2300 08/20/20 0544 08/20/20 1000  BP:   128/76 136/89  Pulse:   (!) 108 (!) 110  Resp:   14 16  Temp:   98.6 F (37 C) 97.7 F (36.5 C)  TempSrc:   Oral Oral  SpO2:   91% 96%  Weight:      Height:      PainSc: 10-Worst pain ever Asleep  0-No pain    Intake/Output Summary (Last 24 hours) at 08/20/2020 1157 Last data filed at 08/20/2020 4008 Gross per 24 hour  Intake 167.22 ml  Output 550 ml  Net -382.78 ml   Filed Weights   08/17/20 0326  Weight: 44.5 kg   Weight change:   Intake/Output from previous day: 02/09 0701 - 02/10 0700 In:  167.2 [IV Piggyback:167.2] Out: 450 [Urine:250; Emesis/NG output:200] Intake/Output this shift: Total I/O In: -  Out: 300 [Emesis/NG output:300] Filed Weights   08/17/20 0326  Weight: 44.5 kg    Examination:  General exam: AAO, ill looking frail old for age, on oxygen, cachectic. HEENT:Oral mucosa moist, Ear/Nose WNL grossly, dentition normal. Respiratory system: bilaterally diminished,no wheezing or crackles,no use of accessory muscle Cardiovascular system: S1 & S2 +, No JVD,. Gastrointestinal system: Abdomen soft, scaphoid, nontender, bowel sounds sluggish Nervous System:Alert, awake, moving extremities and grossly nonfocal Extremities: No edema, distal peripheral pulses palpable.  Skin: No rashes,no icterus. MSK:  Thin muscle mass and bulk and weak  .  Data Reviewed: I have personally reviewed following labs and imaging studies CBC: Recent Labs  Lab 08/17/20 0410 08/17/20 2025 08/18/20 0527 08/19/20 0614 08/20/20 0617  WBC 12.8* 16.5* 14.6* 12.6* 13.4*  NEUTROABS 8.9*  --   --   --   --   HGB 11.5* 12.3 11.7* 11.6* 12.9  HCT 36.6 40.5 37.6 37.8 42.6  MCV 98.7 101.3* 100.3* 100.8* 101.2*  PLT 291 307 292 292 992   Basic Metabolic Panel: Recent Labs  Lab 08/17/20 0410 08/17/20 2025 08/18/20 0527 08/19/20 0614 08/20/20 0617  NA 139  --  139 141 142  K 4.1  --  4.0 4.1 3.9  CL 98  --  98 97* 98  CO2 31  --  28 29 27   GLUCOSE 110*  --  105* 108* 94  BUN 13  --  14 20 31*  CREATININE 0.87 0.70 0.80 0.84 0.91  CALCIUM 9.5  --  9.3 9.4 9.6   GFR: Estimated Creatinine Clearance: 30.6 mL/min (by C-G formula based on SCr of 0.91 mg/dL). Liver Function Tests: Recent Labs  Lab 08/17/20 0410 08/18/20 0527  AST 25 20  ALT 17 15  ALKPHOS 78 75  BILITOT 1.0 1.0  PROT 7.1 6.9  ALBUMIN 3.4* 3.4*   Recent Labs  Lab 08/17/20 0410  LIPASE 27   No results for input(s): AMMONIA in the last 168 hours. Coagulation Profile: No results for input(s): INR, PROTIME in  the last 168 hours. Cardiac Enzymes: No results for input(s): CKTOTAL, CKMB, CKMBINDEX, TROPONINI in the last 168 hours. BNP (last 3 results) Recent Labs    06/03/20 1001  PROBNP 100   HbA1C: No results for input(s): HGBA1C in the last 72 hours. CBG: No results for input(s): GLUCAP in the last 168 hours. Lipid Profile: No results for input(s): CHOL, HDL, LDLCALC, TRIG, CHOLHDL, LDLDIRECT in the last 72 hours. Thyroid Function Tests: No results for input(s): TSH, T4TOTAL, FREET4, T3FREE, THYROIDAB in the last 72 hours. Anemia Panel: Recent Labs    08/17/20 1405  TIBC 282  IRON 23*   Sepsis Labs: No results for input(s): PROCALCITON, LATICACIDVEN in the last 168 hours.  Recent Results (from the past 240 hour(s))  SARS CORONAVIRUS 2 (TAT 6-24 HRS) Nasopharyngeal Nasopharyngeal Swab     Status: None   Collection Time: 08/17/20 12:28 PM   Specimen: Nasopharyngeal Swab  Result Value Ref Range Status   SARS Coronavirus 2 NEGATIVE NEGATIVE Final    Comment: (NOTE) SARS-CoV-2 target nucleic acids are NOT DETECTED.  The SARS-CoV-2 RNA is generally detectable in upper and lower respiratory specimens during the acute phase of infection. Negative results do not preclude SARS-CoV-2 infection, do not rule out co-infections with other pathogens, and should not be used as the sole basis for treatment or other patient management decisions. Negative results must be combined with clinical observations, patient history, and epidemiological information. The expected result is Negative.  Fact Sheet for Patients: SugarRoll.be  Fact Sheet for Healthcare Providers: https://www.woods-mathews.com/  This test is not yet approved or cleared by the [place] FDA and  has been authorized for detection and/or diagnosis of SARS-CoV-2 by FDA under an Emergency Use Authorization (EUA). This EUA will remain  in effect (meaning this test can be used) for  the duration of the COVID-19 declaration under Se ction 564(b)(1) of the Act, 21 U.S.C. section 360bbb-3(b)(1), unless the authorization is terminated or revoked sooner.  Performed at  [facility], [place] [address] [place], [place] [address]      Radiology Studies: DG Abd 1 View  Result Date: 08/19/2020 CLINICAL DATA:  Vomiting, abdominal pain. EXAM: ABDOMEN - 1 VIEW COMPARISON:  August 18, 2020. FINDINGS: The bowel gas pattern is normal. No radio-opaque calculi or other significant radiographic abnormality are seen. IMPRESSION: Negative. Electronically Signed   By: Marijo Conception M.D.   On: 08/19/2020 08:13     LOS: 3 days   Antonieta Pert, MD [facility]  08/20/2020, 11:57 AM

## 2020-08-20 NOTE — Progress Notes (Signed)
Administered enema.  No bowel movement came out.  Only substance from enema.  Pt tolerated well.

## 2020-08-21 ENCOUNTER — Inpatient Hospital Stay: Payer: Self-pay

## 2020-08-21 ENCOUNTER — Inpatient Hospital Stay (HOSPITAL_COMMUNITY): Payer: Medicare Other

## 2020-08-21 DIAGNOSIS — K56609 Unspecified intestinal obstruction, unspecified as to partial versus complete obstruction: Secondary | ICD-10-CM | POA: Diagnosis not present

## 2020-08-21 DIAGNOSIS — J849 Interstitial pulmonary disease, unspecified: Secondary | ICD-10-CM | POA: Diagnosis not present

## 2020-08-21 LAB — COMPREHENSIVE METABOLIC PANEL
ALT: 12 U/L (ref 0–44)
AST: 17 U/L (ref 15–41)
Albumin: 3.2 g/dL — ABNORMAL LOW (ref 3.5–5.0)
Alkaline Phosphatase: 62 U/L (ref 38–126)
Anion gap: 15 (ref 5–15)
BUN: 25 mg/dL — ABNORMAL HIGH (ref 8–23)
CO2: 25 mmol/L (ref 22–32)
Calcium: 9.2 mg/dL (ref 8.9–10.3)
Chloride: 103 mmol/L (ref 98–111)
Creatinine, Ser: 0.76 mg/dL (ref 0.44–1.00)
GFR, Estimated: >60 mL/min (ref >60)
Glucose, Bld: 82 mg/dL (ref 70–99)
Potassium: 3.7 mmol/L (ref 3.5–5.1)
Sodium: 143 mmol/L (ref 135–145)
Total Bilirubin: 1.3 mg/dL — ABNORMAL HIGH (ref 0.3–1.2)
Total Protein: 7 g/dL (ref 6.5–8.1)

## 2020-08-21 LAB — CBC
HCT: 39.4 % (ref 36.0–46.0)
Hemoglobin: 12.1 g/dL (ref 12.0–15.0)
MCH: 30.5 pg (ref 26.0–34.0)
MCHC: 30.7 g/dL (ref 30.0–36.0)
MCV: 99.2 fL (ref 80.0–100.0)
Platelets: 303 10*3/uL (ref 150–400)
RBC: 3.97 MIL/uL (ref 3.87–5.11)
RDW: 14 % (ref 11.5–15.5)
WBC: 14.9 10*3/uL — ABNORMAL HIGH (ref 4.0–10.5)
nRBC: 0 % (ref 0.0–0.2)

## 2020-08-21 MED ORDER — HYDROCORTISONE NA SUCCINATE PF 100 MG IJ SOLR
50.0000 mg | Freq: Every day | INTRAMUSCULAR | Status: DC
  Administered 2020-08-21 – 2020-08-22 (×2): 50 mg via INTRAVENOUS
  Filled 2020-08-21 (×2): qty 2

## 2020-08-21 MED ORDER — IOHEXOL 9 MG/ML PO SOLN
500.0000 mL | ORAL | Status: AC
  Administered 2020-08-21: 500 mL via ORAL

## 2020-08-21 MED ORDER — PHENOL 1.4 % MT LIQD
1.0000 | OROMUCOSAL | Status: DC | PRN
  Filled 2020-08-21: qty 177

## 2020-08-21 MED ORDER — IOHEXOL 300 MG/ML  SOLN
75.0000 mL | Freq: Once | INTRAMUSCULAR | Status: AC | PRN
  Administered 2020-08-21: 75 mL via INTRAVENOUS

## 2020-08-21 MED ORDER — LIDOCAINE HCL URETHRAL/MUCOSAL 2 % EX GEL
1.0000 "application " | Freq: Once | CUTANEOUS | Status: AC
  Administered 2020-08-21: 1
  Filled 2020-08-21 (×2): qty 5

## 2020-08-21 NOTE — Consult Note (Signed)
Palliative Care Consult Note  [name] [name] is an 87 yo woman with a history of Rheumatoid Arthritis, CAD and Pulmonary Fibrosis admitted with abdominal pain, nausea and vomiting. Found to have SBO and fecal impaction. Palliative Care was consulted in the setting of frailty and high risk for complications. Currently she is felt to be high risk surgical candidate but team is having ongoing conversations about treatment opotions and management. She has refused NG tube placement.  Patient is resting in the bed, she just received IV nausea medication but has multiple episodes of vomiting today. She mentally sharp and making her own decisions.  I introduced the concept of palliative care and advance care planning. She is not up for conversation today and would like to include her family in decision making. For now she desires full scope medical interventions and other than an NG tube will accept surgical options and recommendations.  Recommendations: 1. Ongoing goals of care discussion as well as code status discussion within the context of her illness. 2. Scheduled zofran and PRN compazine, can consider adding on small dose of reglan is just partially obstructed. Can also add on very low dose of xyprexa at bedtime. 3. Will continue to support and follow for symptoms and address goals of care as her condition evolves. 4. Will complete a MOST form prior to discharge.  Time: 30 minutes Greater than 50%  of this time was spent counseling and coordinating care related to the above assessment and plan.  Lane Hacker, DO Palliative Medicine

## 2020-08-21 NOTE — Progress Notes (Addendum)
PROGRESS NOTE    [name] [name]  EQA:[id number] DOB: December 13, 1930 DOA: 08/17/2020 PCP: Hoyt Koch, MD   Chief Complaint  Patient presents with  . Abdominal Pain  Brief Narrative: 87 year old female with history of chronic hypoxic respiratory failure on 2 L nasal cannula secondary to ILD/pulmonary fibrosis,rheumatoid arthritis on chronic prednisone, CAD/CHF, dyslipidemia, history of DVT, presented with nausea vomiting abdominal pain, onset 3 days prior to admission. Patient seen in the ED 2/7 reported having a bowel movement a week prior to admission in the ED found to have SBO, fecal impaction disimpacted x2 with enema, [facility] was consulted for SBO and was admitted for further management. 2/8- started on CLD, K but had episode of vomiting on 2/9 - had Xray abd that showed normal bowel gas pattern.  Followed closely by general surgery.  Again had episode of vomiting 2/10 am, surgery plan for enema and repeat CT abdomen if not improving. 2/10-nephew wanted to look into second opinion gave, call was made to transfer center  And [facility] has declined. 2/11- GI also consulted on board.  Subjective:  Seen/examined this morning.  Nephew is at the bedside.  Spent significant amount of time going over all the clinical pictures lab work vitals and consult inputs and plan of care. Patient is not in any distress, no vomiting since yesterday morning.  But has not had bowel movement or flatus.Has abdominal discomfort and tender diffusely, but not distended.  Assessment & Plan:  Nausea/vomiting abdominal pain: SBO,CT in the ED showed dilated small bowel loops with probable transition zone in the pelvis concerning for distal small bowel obstruction or possibly focal ileus or enteritis, large stool in the rectum: No BM or flatus still, no vomiting since yesterday morning.  Per request GI also has been consulted.  Patient could not tolerate NG tube x2 previously.  Surgery is planning for repeat CT abdomen  pelvis, GI also consulted.  Continue gentle fluids, n.p.o., nausea medication pain management.   UTI on admission- has been on ceftriaxone.  No culture sent priot to antibitoics in ED. I will repeat UA.  She has had persistent leukocytosis fluctuating and on immunosuppression/steroid likely contributing. Sepsis not suspected/ not present. Recent Labs  Lab 08/17/20 2025 08/18/20 0527 08/19/20 0614 08/20/20 0617 08/21/20 0613  WBC 16.5* 14.6* 12.6* 13.4* 14.9*   Chronic hypoxic respiratory failure on 2 L [place]: Continue supplemental oxygen 2 L nasal cannula  ILD/pulmonary fibrosis/Rheumatoid arthritis on chronic prednisone, Ofev, CellCept/Remicade outpatient infusion: Unable to take p.o. switch to hydrocortisone IV for now, p.o. medication once able to tolerate.   Continue supplemental oxygen. I will touch pace with her Pulm re Ofev.  H of CAD/CHF ( D) /Dyslipidemia: Euvolemic, watch for fluid retention.  Normally she takes Lasix 20 mg as needed for fluid or edema and currently has no leg edema no orthopnea, continue statin as tolerated.  She has not been taking aspirin and metoprolol prior to admission.  History of DVT not on anticoagulation due to history of falls.  Normocytic anemia no evidence of bleeding.  Hemoglobin is stable at 12 gm. Recent Labs  Lab 08/17/20 2025 08/18/20 0527 08/19/20 0614 08/20/20 0617 08/21/20 0613  HGB 12.3 11.7* 11.6* 12.9 12.1  HCT 40.5 37.6 37.8 42.6 39.4   Goals of care full code.  Multiple comorbidities, with significant pulmonary disease, chronic respiratory failure now with SBO-debilitated.palliative care consultation has been requested..   Nutrition Status: Severe protein calorie malnourishment present, augment diet as per dietitian. bmi is low  at 14.  Dietitian consulted may need to consider TPN if unable to use a cart. Nephew is interested for further nutrition/TPN. Nutrition Problem: Severe Malnutrition Etiology: chronic illness (ILD/pulmonary  fibrosis) Signs/Symptoms: estimated needs Interventions: MVI,Boost Plus,Refer to RD note for recommendations  Diet Order            Diet NPO time specified  Diet effective now                DVT prophylaxis: enoxaparin (LOVENOX) injection 30 mg Start: 08/17/20 2200 Code Status:   Code Status: Full Code  Family Communication: plan of care discussed with patient at bedside. Had extensive discussion with patient's nephew at bedside in length this morning again.  Status is: Inpatient Remains inpatient appropriate because:IV treatments appropriate due to intensity of illness or inability to take PO and Inpatient level of care appropriate due to severity of illness  Dispo: The patient is from: Home              Anticipated d/c is to: tbd               Anticipated d/c date is: 2-3 days              Patient currently is not medically stable to d/c.   Difficult to place patient No  Consultants: [facility] GI PALLIATIVE MEDICINE   Procedures:see note  Culture/Microbiology Other culture-see note  Medications: Scheduled Meds: . atorvastatin  40 mg Oral Daily  . bisacodyl  10 mg Rectal Daily  . dapsone  100 mg Oral Daily  . enoxaparin (LOVENOX) injection  30 mg Subcutaneous Q24H  . feeding supplement  1 Container Oral TID BM  . hydrocortisone sod succinate (SOLU-CORTEF) inj  50 mg Intravenous Daily  . mirtazapine  15 mg Oral QHS  . multivitamin with minerals  1 tablet Oral Daily  . mycophenolate  1,000 mg Oral BID  . Nintedanib  100 mg Oral BID  . polyethylene glycol  17 g Oral BID   Continuous Infusions: . sodium chloride 50 mL/hr at 08/21/20 0851  . cefTRIAXone (ROCEPHIN)  IV 1 g (08/21/20 1033)  . famotidine (PEPCID) IV 20 mg (08/21/20 1112)    Antimicrobials: Anti-infectives (From admission, onward)   Start     Dose/Rate Route Frequency Ordered Stop   08/19/20 1000  dapsone tablet 100 mg       Note to Pharmacy: TAKE 1 TABLET(100 MG) BY MOUTH DAILY     100 mg Oral Daily  08/18/20 1350     08/18/20 1000  cefTRIAXone (ROCEPHIN) 1 g in sodium chloride 0.9 % 100 mL IVPB        1 g 200 mL/hr over 30 Minutes Intravenous Every 24 hours 08/17/20 1414     08/17/20 0615  cefTRIAXone (ROCEPHIN) 1 g in sodium chloride 0.9 % 100 mL IVPB        1 g 200 mL/hr over 30 Minutes Intravenous  Once 08/17/20 0605 08/17/20 0801     Objective: Vitals: Today's Vitals   08/20/20 2045 08/20/20 2336 08/21/20 0303 08/21/20 0525  BP: 132/83   107/77  Pulse: (!) 102   (!) 101  Resp: 14   14  Temp: 99.4 F (37.4 C)   98.8 F (37.1 C)  TempSrc: Oral   Oral  SpO2: 97%   90%  Weight:      Height:      PainSc:  0-No pain Asleep     Intake/Output Summary (Last 24 hours) at 08/21/2020  [place] filed at 08/21/2020 0300 Gross per 24 hour  Intake 99 ml  Output 200 ml  Net -101 ml   Filed Weights   08/17/20 0326  Weight: 44.5 kg   Weight change:   Intake/Output from previous day: 02/10 0701 - 02/11 0700 In: 99 [IV Piggyback:99] Out: 500 [Urine:200; Emesis/NG output:300] Intake/Output this shift: No intake/output data recorded. Filed Weights   08/17/20 0326  Weight: 44.5 kg    Examination:  General exam: Alert awake oriented Frail, on 2 L nasal cannula.  HEENT:Oral mucosa moist, Ear/Nose WNL grossly, dentition normal. Respiratory system: bilaterally diminished, crackles present on posterior lungs,no use of accessory muscle Cardiovascular system: S1 & S2 +, No JVD,. Gastrointestinal system: Abdomen soft, mildly distended, mild diffuse tenderness, bowel sounds absent Nervous System:Alert, awake, moving extremities and grossly nonfocal Extremities: No edema, distal peripheral pulses palpable.  Skin: No rashes,no icterus. MSK: Thin muscle mass and bulk   Data Reviewed: I have personally reviewed following labs and imaging studies CBC: Recent Labs  Lab 08/17/20 0410 08/17/20 2025 08/18/20 0527 08/19/20 0614 08/20/20 0617 08/21/20 0613  WBC 12.8* 16.5*  14.6* 12.6* 13.4* 14.9*  NEUTROABS 8.9*  --   --   --   --   --   HGB 11.5* 12.3 11.7* 11.6* 12.9 12.1  HCT 36.6 40.5 37.6 37.8 42.6 39.4  MCV 98.7 101.3* 100.3* 100.8* 101.2* 99.2  PLT 291 307 292 292 310 950   Basic Metabolic Panel: Recent Labs  Lab 08/17/20 0410 08/17/20 2025 08/18/20 0527 08/19/20 0614 08/20/20 0617 08/21/20 0613  NA 139  --  139 141 142 143  K 4.1  --  4.0 4.1 3.9 3.7  CL 98  --  98 97* 98 103  CO2 31  --  28 29 27 25   GLUCOSE 110*  --  105* 108* 94 82  BUN 13  --  14 20 31* 25*  CREATININE 0.87 0.70 0.80 0.84 0.91 0.76  CALCIUM 9.5  --  9.3 9.4 9.6 9.2   GFR: Estimated Creatinine Clearance: 34.8 mL/min (by C-G formula based on SCr of 0.76 mg/dL). Liver Function Tests: Recent Labs  Lab 08/17/20 0410 08/18/20 0527 08/21/20 0613  AST 25 20 17   ALT 17 15 12   ALKPHOS 78 75 62  BILITOT 1.0 1.0 1.3*  PROT 7.1 6.9 7.0  ALBUMIN 3.4* 3.4* 3.2*   Recent Labs  Lab 08/17/20 0410  LIPASE 27   No results for input(s): AMMONIA in the last 168 hours. Coagulation Profile: No results for input(s): INR, PROTIME in the last 168 hours. Cardiac Enzymes: No results for input(s): CKTOTAL, CKMB, CKMBINDEX, TROPONINI in the last 168 hours. BNP (last 3 results) Recent Labs    06/03/20 1001  PROBNP 100   HbA1C: No results for input(s): HGBA1C in the last 72 hours. CBG: No results for input(s): GLUCAP in the last 168 hours. Lipid Profile: No results for input(s): CHOL, HDL, LDLCALC, TRIG, CHOLHDL, LDLDIRECT in the last 72 hours. Thyroid Function Tests: No results for input(s): TSH, T4TOTAL, FREET4, T3FREE, THYROIDAB in the last 72 hours. Anemia Panel: No results for input(s): VITAMINB12, FOLATE, FERRITIN, TIBC, IRON, RETICCTPCT in the last 72 hours. Sepsis Labs: No results for input(s): PROCALCITON, LATICACIDVEN in the last 168 hours.  Recent Results (from the past 240 hour(s))  SARS CORONAVIRUS 2 (TAT 6-24 HRS) Nasopharyngeal Nasopharyngeal Swab      Status: None   Collection Time: 08/17/20 12:28 PM   Specimen: Nasopharyngeal Swab  Result  Value Ref Range Status   SARS Coronavirus 2 NEGATIVE NEGATIVE Final    Comment: (NOTE) SARS-CoV-2 target nucleic acids are NOT DETECTED.  The SARS-CoV-2 RNA is generally detectable in upper and lower respiratory specimens during the acute phase of infection. Negative results do not preclude SARS-CoV-2 infection, do not rule out co-infections with other pathogens, and should not be used as the sole basis for treatment or other patient management decisions. Negative results must be combined with clinical observations, patient history, and epidemiological information. The expected result is Negative.  Fact Sheet for Patients: SugarRoll.be  Fact Sheet for Healthcare Providers: https://www.woods-mathews.com/  This test is not yet approved or cleared by the [place] FDA and  has been authorized for detection and/or diagnosis of SARS-CoV-2 by FDA under an Emergency Use Authorization (EUA). This EUA will remain  in effect (meaning this test can be used) for the duration of the COVID-19 declaration under Se ction 564(b)(1) of the Act, 21 U.S.C. section 360bbb-3(b)(1), unless the authorization is terminated or revoked sooner.  Performed at [facility], [place] [address] [place], [place] [address]      Radiology Studies: No results found.   LOS: 4 days   Antonieta Pert, MD [facility]  08/21/2020, 12:35 PM

## 2020-08-21 NOTE — Progress Notes (Signed)
Nutrition Follow-up  DOCUMENTATION CODES:   Severe malnutrition in context of chronic illness,Underweight  INTERVENTION:   - TPN per Pharmacy  Monitor magnesium, potassium, and phosphorus daily for at least 3 days, MD to replete as needed, as pt is at risk for refeeding syndrome given severe malnutrition, limited PO intake since admission.  - Please obtain updated weight  - d/c Boost Breeze and MV with minerals as pt is now NPO  NUTRITION DIAGNOSIS:   Severe Malnutrition related to chronic illness (ILD/pulmonary fibrosis) as evidenced by severe fat depletion,severe muscle depletion.  Ongoing, being addressed via initiation of TPN  GOAL:   Patient will meet greater than or equal to 90% of their needs  Progressing with initiation of TPN  MONITOR:   PO intake,Weight trends,I & O's,Labs,Diet advancement,Supplement acceptance  REASON FOR ASSESSMENT:   Consult Assessment of nutrition requirement/status,New TPN/TNA ("considering TPN")  ASSESSMENT:   87 year old female with history of chronic respiratory failure on 2 L O2 secondary to ILD/pulmonary fibrosis, RA on chronic prednisone, CAD/CHF, HLD, history of DVT who presented with nausea, vomiting, abdominal pain over the past 3 days. Pt found to have fecal impaction s/p disimpaction x 2 with enema and admitted for SBO.  2/10 - NPO after episode of vomiting  Per GI note, pt with distal SBO vs focal ileus or enteritis on admitting CT scan. RD consulted for new TPN. Pt was unable to tolerate clear liquids and had episode of vomiting yesterday.  Noted pt has been unable to tolerate NG tube placement since admission. Surgery recommending reattempting NG tube placement to administer oral contrast for CT scan. Per Surgery note this afternoon, staff has contacted Surgery to inform them that they have been unable to place an NG tube. Surgery PA to attempt NG tube placement. Plan is for repeat CT scan this afternoon/evening.  Surgery  also recommending initiation of TPN. RD agrees with initiation of TPN given severe malnutrition and NPO status. Noted Pharmacy has been consulted for TPN initiation and management. Surgery has discussed plan with pt's nephew and also discussed that TPN is not a long-term option or treatment for the pt.  Noted Palliative Medicine has been consulted regarding [place] and code status.  Meal Completion: 0-100% while on clear liquid diet  Medications reviewed and include: dulcolax, IV solu-cortef, remeron 15 mg daily, MVI with minerals, cellcept, miralax, IV abx, IV pepcid, NS @ 50 ml/hr  Labs reviewed: BUN 25, WBC 14.9  Diet Order:   Diet Order            Diet NPO time specified  Diet effective now                 EDUCATION NEEDS:   Education needs have been addressed  Skin:  Skin Assessment: Reviewed RN Assessment  Last BM:  08/20/20 small type 7  Height:   Ht Readings from Last 1 Encounters:  08/17/20 5' 4.5" (1.638 m)    Weight:   Wt Readings from Last 1 Encounters:  08/17/20 44.5 kg    BMI:  Body mass index is 16.56 kg/m.  Estimated Nutritional Needs:   Kcal:  1500-1700  Protein:  70-85 grams  Fluid:  >/= 1.4 L    Gustavus Bryant, MS, RD, LDN Inpatient Clinical Dietitian Please see AMi[facility] for contact information.

## 2020-08-21 NOTE — Progress Notes (Signed)
Multiple attempts have been made to contact the patient's nephew, [name] via his cell phone.  It goes straight to voicemail.  We have been unable to reach him.  I came to the patient's bedside around 12 after being notified earlier this morning that he was here and he had already left.    Henreitta Cea 12:36 PM 08/21/2020

## 2020-08-21 NOTE — Progress Notes (Signed)
I was able to reach the patient's nephew, [name] [name].  We had a long discussion about the current status of the patient as well as our expected plans moving forward.  We discussed that her current x-rays do not show an obstructive bowel gas pattern and that is why we have tried some clear liquids over the last 2 days.  However given she has had a couple episodes of emesis, we have recommended a repeat CT scan to further evaluate and determine whether she has a persistent bowel obstruction present.  We also discussed that ideal conservative management is not necessarily what the patient has had but due to her inability to tolerate an NG tube as well as the initial Gastrografin that was ordered, we have not been able to provide the best conservative management that there is.  The benefits of an NG tube as well as the Gastrografin were thoroughly discussed with [name] [name] to help him understand why these are so important in many times being able to corrected patient's obstructive process and not just bowel rest on its own.  He expressed understanding of this.  We also discussed that we have plans for a repeat CT scan this afternoon.  The patient has been unable to tolerate her contrast once again but has expressed a willingness to reattempt NG tube placement.  The staff has contacted me to let me know they have been unable to place an NG tube and therefore I will go up and try to attempt tube placement for the patient.  If this is able to be placed, I will have the nursing staff put her oral contrast down her NG tube so that we can hopefully get a good CT scan tonight.  [name] [name] and I also discussed her current health state and why conservative management is so important.  We discussed our concern for any type of operative intervention and that she is incredibly high risk for multiple complications as well as death.  We briefly discussed the need for prolonged ventilation due to her pulmonary fibrosis as well as complication  secondary to her malnutrition, chronic immunosuppressive state etc.  He understands and is hopeful that she will improve and resolve without needing any type of aggressive intervention.  We discussed that if she fails conservative management and does not improve, the 2 options would be to have a lengthy discussion regarding surgery and the potential risks and outcome versus a palliative approach to keep the patient comfortable in the process of passing away from a bowel obstruction.  He understood these 2 options if we were to get to that point.  He also expressed concern over her nutritional status.  Clearly the patient is very malnourished at baseline.  The patient was started today on TNA.  We discussed that this is not a long-term option or treatment for the patient.  This is a temporizing nutrition so that if the patient were to decide to take the risk and pursue an operation if she were not to improve that this would give her some nutrition going into surgery.  All of the nephew's questions were answered and he was appreciative of my time.  Henreitta Cea 3:52 PM 08/21/2020

## 2020-08-21 NOTE — Consult Note (Addendum)
Referring Provider:  [facility]         Primary Care Physician:  Hoyt Koch, MD Primary Gastroenterologist:   Previously Dr. Sharlett Iles         We were asked to see this patient for:                  ASSESSMENT / PLAN:   # 87 yo female with nausea / vomiting and large amount of stool and distal SBO vrs focal ileus or enteritis on admitting CT scan. Unitentional weight loss over the last year but nausea / vomiting only started several days ago. Small dilation resolved on follow up KUB 08/19/20 but patient having persistent nausea / vomiting. She has been disimpacted and getting Miralax --Surgery has already ordered a reattempt at NGT placement to give oral contrast. On admission patient didn't tolerate all the oral contrast and attempts at NGT placement were unsuccessful.  --She could have enteritis ( especially since immunocompromised though seems unlikely in absence of diarrhea. She is s/p hysterectomy and appendectomy so SBO from adhesions possible. If CT scan negative and nausea / vomiting persists she may need EGD.    # Weight loss. She gives a history of unintentional 50 pound weight loss over last 1.5 years. Based on weights in Epic she is down 15 pounds over the last year. On Remeron at home  #  UTI, on Rocephin  # Chronic constipation, takes Dulcolax at home as needed. CT scan on admission shows large amount of stool in rectum.  --she will need bowel regimen upon discharge. PRN Dulcolax obviously inadequate. For now continue BID Miralax   # RA / pulmonary fibrosis, on prednisone, CellCept and Remicade Normocytic anemia Severe protein calorie malnutrition     HPI:                                                                                                                             Chief Complaint: nausea / vomiting and weight loss  [name] [name] is a 87 y.o. female with PMH CVA for PT, hyperlipideha CAD, chronic diastolic heart failure , history of  DVT/PE, pulmonary fibrosis, chronic respiratory failure , RA on CellCept and Remicade., hysterectomy, appendectomy  Patient came to ED 08/17/20 for acute nausea / vomiting, constipation and generalized abdominal pain.  She has been unintentionally losing weight over the last year and   Lipase and LFTs unremarkable. CBC chronically elevated on prednisone. CT scan showed dilated small bowel loops with possible transition zone in the pelvis concerning for distal SBO versus focal ileus or enteritis.  Large amount of stool in the rectum concerning impaction.  Patient was unable to tolerate NG tube.  She was admitted for further evaluation and treatment.  Patient underwent disimpaction and has since been getting MiraLAX . General Surgery consulted for SBO. Started on clear liquids 08/18/20, KUB 08/20/19 was negative .  Yesterday surgery advanced her to full liquids but she subsequently  vomited. Made NPO again and given another enema . No stool after enema.  Some nausea this am but no further vomiting. CCS has orderd another attempt at NGT placement to give oral contrast and then repeat a CT scan today. TPN being considered.     PREVIOUS ENDOSCOPIC EVALUATIONS / PERTINENT STUDIES    08/19/20  CT abd/pelvis  IMPRESSION: 1. Dilated small bowel loops are noted in the pelvis with probable transition zone seen in the pelvis, concerning for distal small bowel obstruction or possibly focal ileus or enteritis. 2. Large amount of stool seen in the rectum concerning for impaction. 3. Sigmoid diverticulosis without inflammation. 4. Stable intrahepatic and extrahepatic biliary dilatation is noted. 5. Aortic atherosclerosis.  July 2019 colonoscopy  --complete, excellent bowel prep --diverticulosis and hemorrhoids  Past Medical History:  Diagnosis Date  . Allergic rhinitis   . Allergy   . CAD (coronary artery disease)    Mild CAD by cath 2008  . CHF (congestive heart failure) ([place])   . DJD (degenerative joint  disease)    rheumatoid  . DVT (deep venous thrombosis) ([place])   . Dyslipidemia   . History of echocardiogram    Echo 12/2018: EF 60-65, mod asymmetric LVH, Gr 1 DD  . History of nuclear stress test    Myoview 5/17:  EF 74%, normal perfusion, low risk // Myoview 12/2018:  EF 89, very mild ischemia in inferoapical wall; Low Risk  . History of pulmonary embolism   . Hyperlipidemia   . Insomnia   . Osteoporosis   . Psoriasis   . Pulmonary fibrosis ([place])   . Rheumatoid arthritis [facility])     Past Surgical History:  Procedure Laterality Date  . ABDOMINAL HYSTERECTOMY  1974  . APPENDECTOMY  1959  . CATARACT EXTRACTION    . LEFT HEART CATHETERIZATION WITH CORONARY ANGIOGRAM N/A 06/18/2013   Procedure: LEFT HEART CATHETERIZATION WITH CORONARY ANGIOGRAM;  Surgeon: Peter M Martinique, MD;  Location: [facility] CATH LAB;  Service: Cardiovascular;  Laterality: N/A;  . TONSILLECTOMY  1941, 1951  . TOTAL KNEE ARTHROPLASTY  1997, 2007   Bilateral    Prior to Admission medications   Medication Sig Start Date End Date Taking? Authorizing Provider  atorvastatin (LIPITOR) 40 MG tablet Take 1 tablet (40 mg total) by mouth daily. 04/28/20  Yes Burnell Blanks, MD  dapsone 100 MG tablet TAKE 1 TABLET(100 MG) BY MOUTH DAILY Patient taking differently: Take 100 mg by mouth daily. 12/03/19  Yes Mannam, Praveen, MD  furosemide (LASIX) 20 MG tablet Take 1 tablet by mouth daily as needed for swelling. Patient taking differently: Take 20 mg by mouth daily as needed for fluid or edema. 12/11/18  Yes Burtis Junes, NP  InFLIXimab (REMICADE IV) Inject into the vein as directed. Every 2 months   Yes [provider]  mycophenolate (CELLCEPT) 500 MG tablet 2 tabs bid Patient taking differently: Take 1,000 mg by mouth 2 (two) times daily. 01/27/20  Yes Charlynne Cousins, MD  nitroGLYCERIN (NITROSTAT) 0.4 MG SL tablet Place 1 tablet (0.4 mg total) under the tongue every 5 (five) minutes as needed for chest pain.  06/29/20 09/27/20 Yes Burnell Blanks, MD  predniSONE (DELTASONE) 5 MG tablet TAKE 1 TABLET DAILY WITH BREAKFAST Patient taking differently: Take 5 mg by mouth daily with breakfast. 05/08/20  Yes Mannam, Praveen, MD  aspirin EC 81 MG tablet Take 1 tablet (81 mg total) by mouth daily. Patient not taking: Reported on 08/17/2020 11/30/15   Kathlen Mody,  Scott T, PA-C  fluticasone (FLONASE) 50 MCG/ACT nasal spray Place 2 sprays into both nostrils daily as needed for allergies or rhinitis. 03/17/15   Hoyt Koch, MD  metoprolol succinate (TOPROL XL) 25 MG 24 hr tablet Take 1 tablet (25 mg total) by mouth daily. Patient not taking: Reported on 08/17/2020 12/12/18   Richardson Dopp T, PA-C  mirtazapine (REMERON) 15 MG tablet Take 1 tablet (15 mg total) by mouth at bedtime. Patient not taking: Reported on 08/17/2020 07/18/19   Hoyt Koch, MD  Nintedanib (OFEV) 100 MG CAPS Take 1 capsule (100 mg total) by mouth 2 (two) times daily. 08/18/20   Mannam, Hart Robinsons, MD  nystatin-triamcinolone (MYCOLOG II) cream Apply 1 application topically 2 (two) times daily. Patient not taking: Reported on 08/17/2020 04/13/20   Marrian Salvage, FNP    Current Facility-Administered Medications  Medication Dose Route Frequency Provider Last Rate Last Admin  . 0.9 %  sodium chloride infusion   Intravenous Continuous Antonieta Pert, MD 50 mL/hr at 08/21/20 0851 New Bag at 08/21/20 0851  . alum & mag hydroxide-simeth (MAALOX/MYLANTA) 200-200-20 MG/5ML suspension 30 mL  30 mL Oral Q6H PRN Kc, Ramesh, MD      . atorvastatin (LIPITOR) tablet 40 mg  40 mg Oral Daily Kc, Ramesh, MD   40 mg at 08/19/20 1159  . bisacodyl (DULCOLAX) suppository 10 mg  10 mg Rectal Daily Saverio Danker, PA-C   10 mg at 08/20/20 0940  . cefTRIAXone (ROCEPHIN) 1 g in sodium chloride 0.9 % 100 mL IVPB  1 g Intravenous Q24H Kyle, Tyrone A, DO 200 mL/hr at 08/20/20 0940 1 g at 08/20/20 0940  . dapsone tablet 100 mg  100 mg Oral Daily Kc, Ramesh, MD   100  mg at 08/19/20 1159  . enoxaparin (LOVENOX) injection 30 mg  30 mg Subcutaneous Q24H Kyle, Tyrone A, DO   30 mg at 08/20/20 2202  . famotidine (PEPCID) IVPB 20 mg premix  20 mg Intravenous Q24H Kc, Ramesh, MD 100 mL/hr at 08/20/20 0842 20 mg at 08/20/20 0842  . feeding supplement (BOOST / RESOURCE BREEZE) liquid 1 Container  1 Container Oral TID BM Kc, Ramesh, MD      . fluticasone (FLONASE) 50 MCG/ACT nasal spray 2 spray  2 spray Each Nare Daily PRN Kc, Ramesh, MD      . mirtazapine (REMERON) tablet 15 mg  15 mg Oral QHS Kc, Maren Beach, MD   15 mg at 08/19/20 2208  . morphine 2 MG/ML injection 2 mg  2 mg Intravenous Q4H PRN Marylyn Ishihara, Tyrone A, DO   2 mg at 08/21/20 0233  . multivitamin with minerals tablet 1 tablet  1 tablet Oral Daily Antonieta Pert, MD      . Muscle Rub CREA   Topical PRN Antonieta Pert, MD   Given at 08/18/20 2035  . mycophenolate (CELLCEPT) capsule 1,000 mg  1,000 mg Oral BID Antonieta Pert, MD   1,000 mg at 08/19/20 2208  . Nintedanib (Ofev) CAPS 100 mg  100 mg Oral BID Kc, Ramesh, MD      . ondansetron (ZOFRAN) tablet 4 mg  4 mg Oral Q6H PRN Marylyn Ishihara, Tyrone A, DO       Or  . ondansetron (ZOFRAN) injection 4 mg  4 mg Intravenous Q6H PRN Marylyn Ishihara, Tyrone A, DO   4 mg at 08/19/20 2203  . polyethylene glycol (MIRALAX / GLYCOLAX) packet 17 g  17 g Oral BID Ileana Roup, MD   17 g  at 08/19/20 2208  . predniSONE (DELTASONE) tablet 5 mg  5 mg Oral Q breakfast Kc, Ramesh, MD   5 mg at 08/21/20 0849  . promethazine (PHENERGAN) injection 6.25 mg  6.25 mg Intravenous Q8H PRN Antonieta Pert, MD        Allergies as of 08/17/2020 - Review Complete 08/17/2020  Allergen Reaction Noted  . Crestor [rosuvastatin] Other (See Comments) 03/23/2012  . Lactose intolerance (gi) Other (See Comments) 06/17/2013  . Penicillins Hives   . Imuran [azathioprine] Nausea And Vomiting 01/31/2018    Family History  Problem Relation Age of Onset  . Kidney disease Daughter 5  . Cancer Daughter   . Heart attack Father 76   . Alzheimer's disease Sister   . Heart disease Sister   . Alzheimer's disease Sister     Social History   Socioeconomic History  . Marital status: Widowed    Spouse name: Not on file  . Number of children: 2  . Years of education: Not on file  . Highest education level: Not on file  Occupational History  . Occupation: Publishing rights manager    Comment: retired  Tobacco Use  . Smoking status: Former Smoker    Packs/day: 0.50    Years: 20.00    Pack years: 10.00    Types: Cigarettes    Quit date: 07/11/1973    Years since quitting: 47.1  . Smokeless tobacco: Never Used  Vaping Use  . Vaping Use: Never used  Substance and Sexual Activity  . Alcohol use: No  . Drug use: No  . Sexual activity: Never  Other Topics Concern  . Not on file  Social History Narrative   Diet:   Do you drink/eat things with caffeine? Yes   Marital status:        Widowed                      What year were you married?1954   Do you live in a house, apartment, assisted living, condo, trailer, etc)? House   Is it one or more stories? 1 1/4   How many persons live in your home?1   Do you have any pets in your home? No   Current or past profession:  Publishing rights manager   Do you exercise?        Yes                                            Type & how often:  Walk                                    Do you have a living will? Yes   Do you have a DNR Form? Yes   Do you have a POA/HPOA forms?  Yes   Social Determinants of Health   Financial Resource Strain: Not on file  Food Insecurity: No Food Insecurity  . Worried About Charity fundraiser in the Last Year: Never true  . Ran Out of Food in the Last Year: Never true  Transportation Needs: No Transportation Needs  . Lack of Transportation (Medical): No  . Lack of Transportation (Non-Medical): No  Physical Activity: Not on file  Stress: Not on file  Social Connections: Moderately Integrated  . Frequency  of Communication with  Friends and Family: More than three times a week  . Frequency of Social Gatherings with Friends and Family: More than three times a week  . Attends Religious Services: 1 to 4 times per year  . Active Member of Clubs or Organizations: Yes  . Attends Archivist Meetings: Never  . Marital Status: Widowed  Intimate Partner Violence: Not on file    Review of Systems: All systems reviewed and negative except where noted in HPI.  OBJECTIVE:    Physical Exam: Vital signs in last 24 hours: Temp:  [97.9 F (36.6 C)-99.4 F (37.4 C)] 98.8 F (37.1 C) (02/11 0525) Pulse Rate:  [101-103] 101 (02/11 0525) Resp:  [14-15] 14 (02/11 0525) BP: (107-132)/(77-89) 107/77 (02/11 0525) SpO2:  [90 %-97 %] 90 % (02/11 0525) Last BM Date: 08/21/20 General:   Alert thin female in NAD Psych:  Pleasant, cooperative. Normal mood and affect. Eyes:  Pupils equal, sclera clear, no icterus.   Conjunctiva pink. Ears:  Normal auditory acuity. Nose:  No deformity, discharge,  or lesions. Neck:  Supple; no masses Lungs:  Bilateral "velcro" sounds.   No wheezes, crackles, or rhonchi.  Heart:  Regular rate and rhythm;  no lower extremity edema Abdomen:  Soft, minimally distended without tympany, a few bowel sounds.    Rectal:  Deferred  Msk:  Muscle wasting. Symmetrical without gross deformities.  Neurologic:  Alert and  oriented x4;  grossly normal neurologically. Skin:  Intact without significant lesions or rashes.  Filed Weights   08/17/20 0326  Weight: 44.5 kg     Scheduled inpatient medications . atorvastatin  40 mg Oral Daily  . bisacodyl  10 mg Rectal Daily  . dapsone  100 mg Oral Daily  . enoxaparin (LOVENOX) injection  30 mg Subcutaneous Q24H  . feeding supplement  1 Container Oral TID BM  . mirtazapine  15 mg Oral QHS  . multivitamin with minerals  1 tablet Oral Daily  . mycophenolate  1,000 mg Oral BID  . Nintedanib  100 mg Oral BID  . polyethylene glycol  17 g Oral BID  .  predniSONE  5 mg Oral Q breakfast      Intake/Output from previous day: 02/10 0701 - 02/11 0700 In: 99 [IV Piggyback:99] Out: 500 [Urine:200; Emesis/NG output:300] Intake/Output this shift: No intake/output data recorded.   Lab Results: Recent Labs    08/19/20 0614 08/20/20 0617 08/21/20 0613  WBC 12.6* 13.4* 14.9*  HGB 11.6* 12.9 12.1  HCT 37.8 42.6 39.4  PLT 292 310 303   BMET Recent Labs    08/19/20 0614 08/20/20 0617 08/21/20 0613  NA 141 142 143  K 4.1 3.9 3.7  CL 97* 98 103  CO2 29 27 25   GLUCOSE 108* 94 82  BUN 20 31* 25*  CREATININE 0.84 0.91 0.76  CALCIUM 9.4 9.6 9.2   LFT Recent Labs    08/21/20 0613  PROT 7.0  ALBUMIN 3.2*  AST 17  ALT 12  ALKPHOS 62  BILITOT 1.3*   PT/INR No results for input(s): LABPROT, INR in the last 72 hours. Hepatitis Panel No results for input(s): HEPBSAG, HCVAB, HEPAIGM, HEPBIGM in the last 72 hours.   . CBC Latest Ref Rng & Units 08/21/2020 08/20/2020 08/19/2020  WBC 4.0 - 10.5 K/uL 14.9(H) 13.4(H) 12.6(H)  Hemoglobin 12.0 - 15.0 g/dL 12.1 12.9 11.6(L)  Hematocrit 36.0 - 46.0 % 39.4 42.6 37.8  Platelets 150 - 400 K/uL 303 310 292    .  CMP Latest Ref Rng & Units 08/21/2020 08/20/2020 08/19/2020  Glucose 70 - 99 mg/dL 82 94 108(H)  BUN 8 - 23 mg/dL 25(H) 31(H) 20  Creatinine 0.44 - 1.00 mg/dL 0.76 0.91 0.84  Sodium 135 - 145 mmol/L 143 142 141  Potassium 3.5 - 5.1 mmol/L 3.7 3.9 4.1  Chloride 98 - 111 mmol/L 103 98 97(L)  CO2 22 - 32 mmol/L 25 27 29   Calcium 8.9 - 10.3 mg/dL 9.2 9.6 9.4  Total Protein 6.5 - 8.1 g/dL 7.0 - -  Total Bilirubin 0.3 - 1.2 mg/dL 1.3(H) - -  Alkaline Phos 38 - 126 U/L 62 - -  AST 15 - 41 U/L 17 - -  ALT 0 - 44 U/L 12 - -     Tye Savoy, NP-C @  08/21/2020, 10:00 AM    ________________________________________________________________________  [facility] MD note:  I personally examined the patient, reviewed the data and agree with the assessment and plan described above.   Frail 87 yo woman with significant chronic pulmonary disease now with a SBO with transition in the pelvis. Most likely this is adhesive related. She was unable, unwilling to tolerate an NG tube. On examination she her abd is quite tight, tympanic and mildly tender and so I suspect she is still obstructed.  I explained that that bowel obstructions lead to death unless they open up (spontaneous or with NG tube placement) or are made to open up by a surgery.  She declines another NG attempt but agrees to repeat CT. We will follow along.  NO plans for endoscopic testing at this point.   Owens Loffler, MD [facility] Pager [phone]

## 2020-08-21 NOTE — Progress Notes (Signed)
Occupational Therapy Treatment Patient Details Name: [name] [name] MRN: [id number] DOB: [birth year] Today's Date: 08/21/2020    History of present illness 87 yo female admitted with SBO, UTI. Hx of falls, pulm fibrosis, chronic respiratory failure-O2 dep 2L, DVT, osteoporosis, RA, DJD, CAD, CHF   OT comments  Pt received awake and alert, agreeable to session and highly motivated to participate. No physical assistance required from supine to sit but increased time required. Set up for grooming task while seated EOB. UE/LE exercises of AROM 1 x10 -20 reps and what patient could tolerate. Min A stand pivot to [facility] for toileting, Mod I pericare, and min A returning to chair. Pt did become fatigue with increased distance from [facility] to chair. Pt left with call bell in reach and all needs met. Freq and DC remains the same. Continue POC.    Follow Up Recommendations  Home health OT;Supervision - Intermittent    Equipment Recommendations  3 in 1 bedside commode    Recommendations for Other Services      Precautions / Restrictions Precautions Precautions: Fall Precaution Comments: O2 dep, reports 2 falls in past 6 months (stated related to a medication change that has been resolved)       Mobility Bed Mobility Overal bed mobility: Needs Assistance Bed Mobility: Supine to Sit     Supine to sit: Supervision;HOB elevated     General bed mobility comments: extra time and effort, no physical assist needed, used rail  Transfers Overall transfer level: Needs assistance Equipment used: Rolling walker (2 wheeled) Transfers: Sit to/from Stand Sit to Stand: Min assist (mostly for safety)         General transfer comment: no assist required to power up to stand.    Balance Overall balance assessment: Needs assistance Sitting-balance support: No upper extremity supported;Feet unsupported Sitting balance-[name]: Good Sitting balance - Comments: able to sit unsupported EOB 1-2 minutes    Standing balance support: Bilateral upper extremity supported Standing balance-[name]: Fair                             ADL either performed or assessed with clinical judgement   ADL Overall ADL's : Needs assistance/impaired     Grooming: Set up;Sitting               Lower Body Dressing: Sit to/from stand;Min guard Lower Body Dressing Details (indicate cue type and reason): adjusting of R sock while seated EOB, no c/o discomfort.               General ADL Comments: Improver tolerance to sit EOB to perform oral care, combing hair and washing face with set up.     Vision       Perception     Praxis      Cognition Arousal/Alertness: Awake/alert Behavior During Therapy: WFL for tasks assessed/performed Overall Cognitive Status: Within Functional Limits for tasks assessed                                          Exercises Exercises: General Upper Extremity;General Lower Extremity General Exercises - Upper Extremity Elbow Flexion: AROM;Both;10 reps;Seated General Exercises - Lower Extremity Hip Flexion/Marching: AROM;20 reps;Both;Seated   Shoulder Instructions       General Comments MD came in during session to address nausea and vomitting.    Pertinent Vitals/  Pain       Pain Assessment: Faces Faces Pain Scale: Hurts little more Pain Location: abdomen Pain Descriptors / Indicators: Discomfort;Grimacing;Guarding Pain Intervention(s): Limited activity within patient's tolerance;Monitored during session;Repositioned;RN gave pain meds during session  Home Living                                          Prior Functioning/Environment              Frequency  Min 2X/week        Progress Toward Goals  OT Goals(current goals can now be found in the care plan section)  Progress towards OT goals: Progressing toward goals  Acute Rehab OT Goals Patient Stated Goal: reduce nausea and pain OT Goal  Formulation: With patient Time For Goal Achievement: 09/02/20 Potential to Achieve Goals: Good ADL Goals Pt Will Perform Lower Body Bathing: with modified independence;sit to/from stand Pt Will Perform Lower Body Dressing: with modified independence;sit to/from stand Pt Will Transfer to Toilet: with supervision;ambulating Pt Will Perform Toileting - Clothing Manipulation and hygiene: with modified independence;sit to/from stand  Plan Discharge plan remains appropriate;Frequency remains appropriate    Co-evaluation                 AM-PAC OT "6 Clicks" Daily Activity     Outcome Measure   Help from another person eating meals?: None Help from another person taking care of personal grooming?: A Little Help from another person toileting, which includes using toliet, bedpan, or urinal?: A Little Help from another person bathing (including washing, rinsing, drying)?: A Little Help from another person to put on and taking off regular upper body clothing?: A Little Help from another person to put on and taking off regular lower body clothing?: A Little 6 Click Score: 19    End of Session Equipment Utilized During Treatment: Oxygen  OT Visit Diagnosis: Unsteadiness on feet (R26.81);Pain;Muscle weakness (generalized) (M62.81)   Activity Tolerance Patient limited by pain;Other (comment) (nausea)   Patient Left with call bell/phone within reach;in chair;with chair alarm set;with nursing/sitter in room   Nurse Communication Other (comment)        Time: 1030-1106 OT Time Calculation (min): 36 min  Charges: OT General Charges $OT Visit: 1 Visit OT Treatments $Self Care/Home Management : 8-22 mins $Therapeutic Exercise: 8-22 mins  Minus Breeding, MSOT, OTR/L  Supplemental Rehabilitation Services  [phone]    Marius Ditch 08/21/2020, 11:14 AM

## 2020-08-21 NOTE — TOC Initial Note (Addendum)
Transition of Care [facility]) - Initial/Assessment Note    Patient Details  Name: [name] [name] MRN: [id number] Date of Birth: 12/21/1930  [facility] CM/SW Contact:    Lynnell Catalan, RN Phone Number: 08/21/2020, 9:51 AM  Clinical Narrative:                 Spoke with pt and nephew [name] at bedside for dc planning. Pt offered choice for home health services and [facility] chosen. [facility] liaison contacted for referral. Pt states she has a RW and BSC at home already. Will need orders for HHPT/OT at discharge. Nephew concerned about pt intake. Dietary has already been consulted in the hospital.  Expected Discharge Plan: [place] Barriers to Discharge: Continued Medical Work up   Patient Goals and CMS Choice Patient states their goals for this hospitalization and ongoing recovery are:: To go home CMS Medicare.gov Compare Post Acute Care list provided to:: Patient Choice offered to / list presented to : Patient  Expected Discharge Plan and Services Expected Discharge Plan: [place]   Discharge Planning Services: CM Consult Post Acute Care Choice: [place] arrangements for the past 2 months: [place]: PT,OT [place] Agency: [place] Date [facility] Agency Contacted: 08/21/20 Time HH Agency Contacted: [phone] Representative spoke with at [place]: Tommi Rumps  Prior Living Arrangements/Services Living arrangements for the past 2 months: [place] Lives with:: Relatives Patient language and need for interpreter reviewed:: Yes Do you feel safe going back to the place where you live?: Yes      Need for Family Participation in Patient Care: Yes (Comment) Care giver support system in place?: Yes (comment)   Criminal Activity/Legal Involvement Pertinent to Current Situation/Hospitalization: No - Comment as needed  Activities of Daily Living Home Assistive Devices/Equipment:  Cane (specify quad or straight),Dentures (specify type),Eyeglasses,Other (Comment) (upper/lower dentures, single point cane, walk-in shower, handicap height toilet) ADL Screening (condition at time of admission) Patient's cognitive ability adequate to safely complete daily activities?: Yes Is the patient deaf or have difficulty hearing?: No Does the patient have difficulty seeing, even when wearing glasses/contacts?: No Does the patient have difficulty concentrating, remembering, or making decisions?: No Patient able to express need for assistance with ADLs?: Yes Does the patient have difficulty dressing or bathing?: No Independently performs ADLs?: Yes (appropriate for developmental age) Does the patient have difficulty walking or climbing stairs?: Yes (secondary to weakness) Weakness of Legs: Both Weakness of Arms/Hands: None  Permission Sought/Granted   Permission granted to share information with : Yes, Verbal Permission Granted     Permission granted to share info w AGENCY: [place]        Emotional Assessment Appearance:: Appears stated age Attitude/Demeanor/Rapport: Gracious Affect (typically observed): Calm Orientation: : Oriented to Self,Oriented to Place,Oriented to  Time,Oriented to Situation Alcohol / Substance Use: Not Applicable Psych Involvement: No (comment)  Admission diagnosis:  Slow transit constipation [K59.01] Small bowel obstruction ([place]) [K56.609] SBO (small bowel obstruction) ([place]) [K56.609] Encounter for imaging study to confirm nasogastric (NG) tube placement [Z01.89] Acute cystitis without hematuria [N30.00] Patient Active Problem List   Diagnosis Date Noted  . Protein-calorie malnutrition, severe 08/20/2020  . SBO (small bowel obstruction) ([place]) 08/17/2020  . Fall 01/25/2020  . Other fatigue 01/03/2020  . Left shoulder  pain 01/03/2020  . Urinary frequency 01/03/2020  . Acute otalgia, left 09/06/2019  . Unintentional weight loss 07/18/2019  .  Personal history of PE (pulmonary embolism) 04/23/2019  . Headache 02/11/2019  . Iron deficiency anemia 12/21/2018  . Cold sore 10/23/2018  . Blood in stool 09/14/2018  . 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 Therapeutic drug monitoring 07/17/2018  . Chronic diastolic CHF (congestive heart failure) ([place]) 12/19/2017  . Rash 08/11/2017  . Chronic respiratory failure with hypoxia ([place]) 03/30/2017  . Angular cheilitis 03/08/2017  . Leg pain 10/25/2016  . Back pain 08/05/2016  . RA (rheumatoid arthritis) ([place]) 03/31/2015  . Allergic rhinitis   . Varicose vein 09/11/2014  . ILD (interstitial lung disease) ([place]) 06/25/2012  . Chest pain 02/27/2012  . CAD (coronary artery disease) 02/27/2012  . Pruritus 08/18/2009  . Postinflammatory pulmonary fibrosis / RA ILD  06/30/2008  . Constipation 01/03/2008  . Dyslipidemia 06/16/2007  . Personal history of DVT (deep vein thrombosis) 06/16/2007   PCP:  Hoyt Koch, MD Pharmacy:   [facility] - [address], [place] [address] Phone: [phone] Fax: [place], [place] - [place] AT [facility] [place] [address] Phone: [phone] Fax: [phone]  [facility] [place], [place] [address] [place] [address] Phone: [phone] Fax: [place], [place] [address] Phone: [phone] Fax: [phone]     Social Determinants of Health (SDOH) Interventions    Readmission Risk Interventions No flowsheet data found.

## 2020-08-21 NOTE — Care Management Important Message (Signed)
Medicare IM printed for [name] [name], NCM to give to the patient. 

## 2020-08-21 NOTE — Progress Notes (Signed)
[facility] Progress Note     Subjective: CC-  Continues to have intermittent crampy abdominal pain. Some nausea, no further emesis since yesterday morning. No flatus or BM.  Objective: Vital signs in last 24 hours: Temp:  [97.7 F (36.5 C)-99.4 F (37.4 C)] 98.8 F (37.1 C) (02/11 0525) Pulse Rate:  [101-110] 101 (02/11 0525) Resp:  [14-16] 14 (02/11 0525) BP: (107-136)/(77-89) 107/77 (02/11 0525) SpO2:  [90 %-97 %] 90 % (02/11 0525) Last BM Date: 08/21/20  Intake/Output from previous day: 02/10 0701 - 02/11 0700 In: 99 [IV Piggyback:99] Out: 500 [Urine:200; Emesis/NG output:300] Intake/Output this shift: No intake/output data recorded.  PE: Gen:  Alert, NAD, pleasant Pulm: rate and effort normal Abd: Soft, minimal distension, few BS heard, mild diffuse TTP without rebound or guarding Skin: warm and dry  Lab Results:  Recent Labs    08/20/20 0617 08/21/20 0613  WBC 13.4* 14.9*  HGB 12.9 12.1  HCT 42.6 39.4  PLT 310 303   BMET Recent Labs    08/20/20 0617 08/21/20 0613  NA 142 143  K 3.9 3.7  CL 98 103  CO2 27 25  GLUCOSE 94 82  BUN 31* 25*  CREATININE 0.91 0.76  CALCIUM 9.6 9.2   PT/INR No results for input(s): LABPROT, INR in the last 72 hours. CMP     Component Value Date/Time   NA 143 08/21/2020 0613   NA 138 04/29/2020 0948   K 3.7 08/21/2020 0613   CL 103 08/21/2020 0613   CO2 25 08/21/2020 0613   GLUCOSE 82 08/21/2020 0613   BUN 25 (H) 08/21/2020 0613   BUN 11 04/29/2020 0948   CREATININE 0.76 08/21/2020 0613   CREATININE 0.74 11/30/2015 1327   CALCIUM 9.2 08/21/2020 0613   PROT 7.0 08/21/2020 0613   PROT 6.5 04/29/2020 0948   ALBUMIN 3.2 (L) 08/21/2020 0613   ALBUMIN 3.8 04/29/2020 0948   AST 17 08/21/2020 0613   ALT 12 08/21/2020 0613   ALKPHOS 62 08/21/2020 0613   BILITOT 1.3 (H) 08/21/2020 0613   BILITOT 0.7 04/29/2020 0948   GFRNONAA >60 08/21/2020 0613   GFRAA 69 04/29/2020 0948   Lipase     Component  Value Date/Time   LIPASE 27 08/17/2020 0410       Studies/Results: DG Abd 1 View  Result Date: 08/19/2020 CLINICAL DATA:  Vomiting, abdominal pain. EXAM: ABDOMEN - 1 VIEW COMPARISON:  August 18, 2020. FINDINGS: The bowel gas pattern is normal. No radio-opaque calculi or other significant radiographic abnormality are seen. IMPRESSION: Negative. Electronically Signed   By: Marijo Conception M.D.   On: 08/19/2020 08:13    Anti-infectives: Anti-infectives (From admission, onward)   Start     Dose/Rate Route Frequency Ordered Stop   08/19/20 1000  dapsone tablet 100 mg       Note to Pharmacy: TAKE 1 TABLET(100 MG) BY MOUTH DAILY     100 mg Oral Daily 08/18/20 1350     08/18/20 1000  cefTRIAXone (ROCEPHIN) 1 g in sodium chloride 0.9 % 100 mL IVPB        1 g 200 mL/hr over 30 Minutes Intravenous Every 24 hours 08/17/20 1414     08/17/20 0615  cefTRIAXone (ROCEPHIN) 1 g in sodium chloride 0.9 % 100 mL IVPB        1 g 200 mL/hr over 30 Minutes Intravenous  Once 08/17/20 0605 08/17/20 0801       Assessment/Plan Pulmonary fibrosis, chronic hypoxic respiratory failure,  on 2LNC at home - on prednisone, cellcept, ofev, remicade CHF CAD RA H/O DVT/PE UTI - rocephin per medicine Constipation - she has had an enema and manual disimpaction x2  SBO - CT 2/7 showed dilated small bowel loops in the pelvis with probable transition zone seen in the pelvis concerning for distal small bowel obstruction or possibly focal ileus or enteritis - Xray 2/9 with normal bowel gas pattern and she tolerated some clear liquids - Patient has had no return in bowel function concerning for persistent SBO. She was unable to tolerate NG tube placement upon admission. She is high risk for surgical intervention therefore would favor attempting nonoperative management for around 1 week in the hopes the this obstructions will resolve, unless patient were to decompensate. Recommend attempting NG tube placement again to  allow [place] to give her oral contrast through the tube and repeat CT scan today. Recommend starting TPN due to prolonged intolerance to PO intake. Patient thanked me for by opinion but did not agree or disagree to this treatment plan. Apparently she and family are considering transfer to [facility]. Will reach out to family later today.   ID - rocephin 2/7>> FEN - NPO VTE - SCDs, lovenox Foley - none Follow up - TBD   LOS: 4 days    [place], [facility] 08/21/2020, 7:48 AM Please see Amion for pager number during day hours 7:00am-4:30pm

## 2020-08-22 ENCOUNTER — Inpatient Hospital Stay (HOSPITAL_COMMUNITY): Payer: Medicare Other

## 2020-08-22 DIAGNOSIS — K56609 Unspecified intestinal obstruction, unspecified as to partial versus complete obstruction: Secondary | ICD-10-CM | POA: Diagnosis not present

## 2020-08-22 LAB — URINALYSIS, ROUTINE W REFLEX MICROSCOPIC
Bilirubin Urine: NEGATIVE
Glucose, UA: NEGATIVE mg/dL
Ketones, ur: 80 mg/dL — AB
Nitrite: NEGATIVE
Protein, ur: 100 mg/dL — AB
RBC / HPF: >50 RBC/hpf — ABNORMAL HIGH (ref 0–5)
Specific Gravity, Urine: 1.036 — ABNORMAL HIGH (ref 1.005–1.030)
WBC, UA: >50 WBC/hpf — ABNORMAL HIGH (ref 0–5)
pH: 6 (ref 5.0–8.0)

## 2020-08-22 LAB — COMPREHENSIVE METABOLIC PANEL
ALT: 12 U/L (ref 0–44)
AST: 17 U/L (ref 15–41)
Albumin: 3.3 g/dL — ABNORMAL LOW (ref 3.5–5.0)
Alkaline Phosphatase: 66 U/L (ref 38–126)
Anion gap: 15 (ref 5–15)
BUN: 24 mg/dL — ABNORMAL HIGH (ref 8–23)
CO2: 27 mmol/L (ref 22–32)
Calcium: 9.5 mg/dL (ref 8.9–10.3)
Chloride: 104 mmol/L (ref 98–111)
Creatinine, Ser: 0.8 mg/dL (ref 0.44–1.00)
GFR, Estimated: >60 mL/min (ref >60)
Glucose, Bld: 80 mg/dL (ref 70–99)
Potassium: 3.8 mmol/L (ref 3.5–5.1)
Sodium: 146 mmol/L — ABNORMAL HIGH (ref 135–145)
Total Bilirubin: 1 mg/dL (ref 0.3–1.2)
Total Protein: 6.8 g/dL (ref 6.5–8.1)

## 2020-08-22 LAB — DIFFERENTIAL
Abs Immature Granulocytes: 0.11 10*3/uL — ABNORMAL HIGH (ref 0.00–0.07)
Basophils Absolute: 0 10*3/uL (ref 0.0–0.1)
Basophils Relative: 0 %
Eosinophils Absolute: 0 10*3/uL (ref 0.0–0.5)
Eosinophils Relative: 0 %
Immature Granulocytes: 1 %
Lymphocytes Relative: 15 %
Lymphs Abs: 2.5 10*3/uL (ref 0.7–4.0)
Monocytes Absolute: 1.4 10*3/uL — ABNORMAL HIGH (ref 0.1–1.0)
Monocytes Relative: 9 %
Neutro Abs: 12.5 10*3/uL — ABNORMAL HIGH (ref 1.7–7.7)
Neutrophils Relative %: 75 %

## 2020-08-22 LAB — CBC
HCT: 40.4 % (ref 36.0–46.0)
Hemoglobin: 12.2 g/dL (ref 12.0–15.0)
MCH: 30.5 pg (ref 26.0–34.0)
MCHC: 30.2 g/dL (ref 30.0–36.0)
MCV: 101 fL — ABNORMAL HIGH (ref 80.0–100.0)
Platelets: 262 10*3/uL (ref 150–400)
RBC: 4 MIL/uL (ref 3.87–5.11)
RDW: 14.1 % (ref 11.5–15.5)
WBC: 16.6 10*3/uL — ABNORMAL HIGH (ref 4.0–10.5)
nRBC: 0 % (ref 0.0–0.2)

## 2020-08-22 LAB — MAGNESIUM: Magnesium: 1.9 mg/dL (ref 1.7–2.4)

## 2020-08-22 LAB — PREALBUMIN: Prealbumin: 7.1 mg/dL — ABNORMAL LOW (ref 18–38)

## 2020-08-22 LAB — TRIGLYCERIDES: Triglycerides: 88 mg/dL (ref <150)

## 2020-08-22 LAB — PHOSPHORUS: Phosphorus: 1.7 mg/dL — ABNORMAL LOW (ref 2.5–4.6)

## 2020-08-22 MED ORDER — POTASSIUM PHOSPHATES 15 MMOLE/5ML IV SOLN
15.0000 mmol | Freq: Once | INTRAVENOUS | Status: AC
  Administered 2020-08-22: 15 mmol via INTRAVENOUS
  Filled 2020-08-22: qty 5

## 2020-08-22 MED ORDER — HYDROXYZINE HCL 50 MG/ML IM SOLN
50.0000 mg | Freq: Four times a day (QID) | INTRAMUSCULAR | Status: DC | PRN
  Filled 2020-08-22: qty 1

## 2020-08-22 MED ORDER — CHLORHEXIDINE GLUCONATE CLOTH 2 % EX PADS
6.0000 | MEDICATED_PAD | Freq: Every day | CUTANEOUS | Status: DC
  Administered 2020-08-22 – 2020-09-07 (×17): 6 via TOPICAL

## 2020-08-22 MED ORDER — SODIUM CHLORIDE 0.9% FLUSH
10.0000 mL | Freq: Two times a day (BID) | INTRAVENOUS | Status: DC
  Administered 2020-08-22 – 2020-09-05 (×14): 10 mL
  Administered 2020-09-06: 20 mL
  Administered 2020-09-06 – 2020-09-07 (×2): 10 mL

## 2020-08-22 MED ORDER — SODIUM CHLORIDE 0.9% FLUSH
10.0000 mL | INTRAVENOUS | Status: DC | PRN
Start: 2020-08-22 — End: 2020-09-08
  Administered 2020-09-01: 10:00:00 10 mL

## 2020-08-22 MED ORDER — SODIUM CHLORIDE 0.9 % IV SOLN: INTRAVENOUS | Status: DC

## 2020-08-22 MED ORDER — HYDROCORTISONE NA SUCCINATE PF 100 MG IJ SOLR
25.0000 mg | Freq: Two times a day (BID) | INTRAMUSCULAR | Status: DC
  Administered 2020-08-22: 25 mg via INTRAVENOUS
  Filled 2020-08-22: qty 2

## 2020-08-22 MED ORDER — HYDROXYZINE HCL 50 MG/ML IM SOLN
25.0000 mg | Freq: Four times a day (QID) | INTRAMUSCULAR | Status: DC | PRN
  Administered 2020-08-22: 23:00:00 25 mg via INTRAMUSCULAR
  Filled 2020-08-22 (×2): qty 0.5

## 2020-08-22 MED ORDER — TRAVASOL 10 % IV SOLN
INTRAVENOUS | Status: AC
  Filled 2020-08-22: qty 480

## 2020-08-22 NOTE — Progress Notes (Signed)
PHARMACY - TOTAL PARENTERAL NUTRITION CONSULT NOTE   Indication: Small bowel obstruction  Patient Measurements: Height: 5' 4.5" (163.8 cm) Weight: 44.5 kg (98 lb) IBW/kg (Calculated) : 55.85 TPN AdjBW (KG): 44.5 Body mass index is 16.56 kg/m.  Assessment: 87 yo female with persistent SBO that may be result of gallstone ileus. Per GI, NGT may allow her obstruction to open but she has refused this and will likely need surgery otherwise  Glucose / Insulin: am glucose 80, note patient on chronic steroids currently on solucortef 50mg  q24 Electrolytes: WNL except Na 146 and phos 1.7 Renal: Scr 0.8 LFTs / TGs: LFTs WNL, TG 88 Prealbumin / albumin: prealb 7.1, alb 3.3 Intake / Output; MIVF:  GI Imaging: SBO per CT Surgeries / Procedures: none yet but may be needed in coming days  Central access: 2/12 - PICC ordered 2/11 TPN start date: 2/12  Nutritional Goals (per RD recommendation on 2/11): kCal: 1500-1700, Protein: 70-85, Fluid: >= 1.4L  Note that currently there is a Lipid emulsion shortage so SMOFlipid will only be added on MWF for appropriate patients  Goal TPN rate MWF with lipid is 60 mL/hr (provides 72 g of protein and 1454 kcals per day)  Goal TPN rate TuThSatSun without lipid is 60 ml/hr (provides 72g protein and 1267 kcal)   Current Nutrition:  NPO  Plan:  Now: Kphos 15 mMol x 1  Start TPN at 26mL/hr at 1800 Electrolytes in TPN: 52mEq/L of Na, 58mEq/L of K, 71mEq/L of Ca, 27mEq/L of Mg, and 50mmol/L of Phos. Cl:Ac ratio 1:1 Add standard MVI and trace elements to TPN Initiate Sensitive q8h SSI and adjust as needed  Reduce MIVF to KVO mL/hr at 1800 Monitor TPN labs on Mon/Thurs and as needed  [name] 08/22/2020,8:25 AM

## 2020-08-22 NOTE — Progress Notes (Signed)
[facility] Progress Note    Since last GI note: She declined NG however was able to get a fairly helpful CT scan, see results below. Nausea but no vomiting.  No flatus. Abd discomfort only when palpated.  Objective: Vital signs in last 24 hours: Temp:  [98.3 F (36.8 C)-98.8 F (37.1 C)] 98.8 F (37.1 C) (02/11 2218) Pulse Rate:  [98-105] 105 (02/12 0010) Resp:  [16-17] 17 (02/11 2218) BP: (128-135)/(75-98) 128/75 (02/11 2218) SpO2:  [88 %-97 %] 94 % (02/12 0010) Last BM Date: 08/21/20 General: alert and oriented times 3 Heart: regular rate and rythm Abdomen: tense, tympanic, BS only trace, mildly tender throughout   Lab Results: Recent Labs    08/20/20 0617 08/21/20 0613  WBC 13.4* 14.9*  HGB 12.9 12.1  PLT 310 303  MCV 101.2* 99.2   Recent Labs    08/20/20 0617 08/21/20 0613  NA 142 143  K 3.9 3.7  CL 98 103  CO2 27 25  GLUCOSE 94 82  BUN 31* 25*  CREATININE 0.91 0.76  CALCIUM 9.6 9.2   Recent Labs    08/21/20 0613  PROT 7.0  ALBUMIN 3.2*  AST 17  ALT 12  ALKPHOS 62  BILITOT 1.3*   No results for input(s): INR in the last 72 hours.   Studies/Results: CT ABDOMEN PELVIS W CONTRAST  Result Date: 08/21/2020 CLINICAL DATA:  Follow-up small bowel obstruction. EXAM: CT ABDOMEN AND PELVIS WITH CONTRAST TECHNIQUE: Multidetector CT imaging of the abdomen and pelvis was performed using the standard protocol following bolus administration of intravenous contrast. CONTRAST:  28mL OMNIPAQUE IOHEXOL 300 MG/ML  SOLN COMPARISON:  08/17/2020 CT scan. FINDINGS: Lower chest: Severe chronic pulmonary fibrosis again demonstrated. The heart is within normal limits in size. Stable aortic and coronary artery calcifications. Hepatobiliary: No hepatic lesions are identified. Persistent central intrahepatic biliary dilatation significant common bile duct dilatation measuring up to 15 mm. Gallbladder is mildly distended. No obvious gallstones. Pancreas: Moderate  pancreatic atrophy but no mass, inflammation or ductal dilatation. Spleen: Normal size.  No focal lesions. Adrenals/Urinary Tract: Adrenal glands and kidneys are unremarkable. The bladder is unremarkable. Stomach/Bowel: Progressive small-bowel obstruction with marked distention of the stomach duodenum and entire small bowel down into the pelvis. There appears to be a transition point associated with an intraluminal lesion. This lesion contains a small amount of gas centrally and is most likely a large gallstone. I believe was present on the prior study also beyond this lesions the distal ileum is decompressed/normal in caliber all the way to the terminal ileum. The colon is decompressed. Vascular/Lymphatic: Stable advanced atherosclerotic calcifications involving the aorta and branch vessels but no aneurysm or dissection. No mesenteric or retroperitoneal mass or adenopathy. Reproductive: Surgically absent. Other: No free air or significant free fluid collections. Musculoskeletal: No significant bony findings. Stable degenerative changes involving the lumbar spine. IMPRESSION: 1. Progressive small-bowel obstruction with significant gastric and small bowel distension. There is a transition point associated with an intraluminal lesion in the mid distal ileum in the mid pelvis. This is most likely a large gallstone (gallstone ileus). 2. Persistent intra and extrahepatic biliary dilatation without obvious cause. A distal stricture is possible. 3. Stable advanced atherosclerotic calcifications involving the aorta and branch vessels. 4. Severe chronic pulmonary fibrosis. 5. Aortic atherosclerosis. Aortic Atherosclerosis (ICD10-I70.0). Electronically Signed   By: Marijo Sanes M.D.   On: 08/21/2020 20:00   [place] EKG SITE RITE  Result Date: 08/21/2020 If [facility] image not attached, placement could  not be confirmed due to current cardiac rhythm.    Medications: Scheduled Meds: . atorvastatin  40 mg Oral Daily  .  bisacodyl  10 mg Rectal Daily  . dapsone  100 mg Oral Daily  . enoxaparin (LOVENOX) injection  30 mg Subcutaneous Q24H  . hydrocortisone sod succinate (SOLU-CORTEF) inj  50 mg Intravenous Daily  . mirtazapine  15 mg Oral QHS  . mycophenolate  1,000 mg Oral BID  . Nintedanib  100 mg Oral BID  . polyethylene glycol  17 g Oral BID   Continuous Infusions: . sodium chloride 50 mL/hr at 08/21/20 0851  . cefTRIAXone (ROCEPHIN)  IV 1 g (08/21/20 1033)  . famotidine (PEPCID) IV 20 mg (08/21/20 1112)   PRN Meds:.alum & mag hydroxide-simeth, fluticasone, morphine injection, Muscle Rub, ondansetron **OR** ondansetron (ZOFRAN) IV, phenol, promethazine    Assessment/Plan: 87 y.o. female with persistent SBO since admission 5 days ago  Interesting SBO in that it may be due to famed 'gallstone ileus'.  I explained that this will likely lead to her demise if it is not treated. I again told her that NG tube may allow her obstruction to open, possibly obviating the need for a risky exploratory surgery.  She is still not interested in an NG tube.  Surgery team to discuss with her further.  She does have biliary dilation but normal LFTs. If this is truly from gallstone ileus then she may have GB or biliary fistula which could be addressed if she undergoes surgery as long as that would not significantly increase her M/M risk.  I see no reason for endoscopic procedures.  Please call or page with any further questions or concerns.     Milus Banister, MD  08/22/2020, 7:18 AM [facility] Pager [phone]

## 2020-08-22 NOTE — Progress Notes (Signed)
This RN attempted placement of NG tube per order. Patient is refusing placement of NG tube. She states "we aren't going through the nose again, I can't handle that." This RN educated patient on importance of placing NG tube to help treat SBO. Patient still refusing placement of NG tube at this time. Patient not currently having any vomiting but has been reporting some nausea. Currently resting at this time. Ordering physician notified via secure chat of patient refusing NG tube placement. Will continue to follow current treatment plan and update as necessary.

## 2020-08-22 NOTE — Progress Notes (Signed)
I attempted placement of an NG tube at the bedside multiple times without success. Tried 18, 16 and 14 Fr tubes via both nares. Tube gets stuck just beyond the oropharynx and causes patient significant pain. Discussed with radiology, they will attempt placement with fluoroscopic guidance. I discussed this with the patient and she is agreeable to this. I tried calling patient's nephew [name] with no answer, will call again tomorrow.

## 2020-08-22 NOTE — Progress Notes (Signed)
Subjective: CT scan done yesterday shows gastric distension and proximal small bowel distension, consistent with SBO. NG not in place. Patient reports some nausea this morning but no vomiting.   Objective: Vital signs in last 24 hours: Temp:  [98.3 F (36.8 C)-98.8 F (37.1 C)] 98.7 F (37.1 C) (02/12 0731) Pulse Rate:  [98-105] 101 (02/12 0731) Resp:  [16-17] 16 (02/12 0731) BP: (128-135)/(75-98) 128/78 (02/12 0731) SpO2:  [88 %-97 %] 90 % (02/12 0731) Last BM Date: 08/21/20  Intake/Output from previous day: 02/11 0701 - 02/12 0700 In: -  Out: 50 [Urine:50] Intake/Output this shift: No intake/output data recorded.  PE: General: resting comfortably, NAD. Appears thin and malnourished. Neuro: alert and oriented, no focal deficits Resp: normal work of breathing Abdomen: mildly distended but soft and nontender. Well-healed lower midline surgical scar. Extremities: warm and well-perfused   Lab Results:  Recent Labs    08/21/20 0613 08/22/20 0634  WBC 14.9* 16.6*  HGB 12.1 12.2  HCT 39.4 40.4  PLT 303 262   BMET Recent Labs    08/21/20 0613 08/22/20 0634  NA 143 146*  K 3.7 3.8  CL 103 104  CO2 25 27  GLUCOSE 82 80  BUN 25* 24*  CREATININE 0.76 0.80  CALCIUM 9.2 9.5   PT/INR No results for input(s): LABPROT, INR in the last 72 hours. CMP     Component Value Date/Time   NA 146 (H) 08/22/2020 0634   NA 138 04/29/2020 0948   K 3.8 08/22/2020 0634   CL 104 08/22/2020 0634   CO2 27 08/22/2020 0634   GLUCOSE 80 08/22/2020 0634   BUN 24 (H) 08/22/2020 0634   BUN 11 04/29/2020 0948   CREATININE 0.80 08/22/2020 0634   CREATININE 0.74 11/30/2015 1327   CALCIUM 9.5 08/22/2020 0634   PROT 6.8 08/22/2020 0634   PROT 6.5 04/29/2020 0948   ALBUMIN 3.3 (L) 08/22/2020 0634   ALBUMIN 3.8 04/29/2020 0948   AST 17 08/22/2020 0634   ALT 12 08/22/2020 0634   ALKPHOS 66 08/22/2020 0634   BILITOT 1.0 08/22/2020 0634   BILITOT 0.7 04/29/2020 0948   GFRNONAA  >60 08/22/2020 0634   GFRAA 69 04/29/2020 0948   Lipase     Component Value Date/Time   LIPASE 27 08/17/2020 0410       Studies/Results: CT ABDOMEN PELVIS W CONTRAST  Result Date: 08/21/2020 CLINICAL DATA:  Follow-up small bowel obstruction. EXAM: CT ABDOMEN AND PELVIS WITH CONTRAST TECHNIQUE: Multidetector CT imaging of the abdomen and pelvis was performed using the standard protocol following bolus administration of intravenous contrast. CONTRAST:  49mL OMNIPAQUE IOHEXOL 300 MG/ML  SOLN COMPARISON:  08/17/2020 CT scan. FINDINGS: Lower chest: Severe chronic pulmonary fibrosis again demonstrated. The heart is within normal limits in size. Stable aortic and coronary artery calcifications. Hepatobiliary: No hepatic lesions are identified. Persistent central intrahepatic biliary dilatation significant common bile duct dilatation measuring up to 15 mm. Gallbladder is mildly distended. No obvious gallstones. Pancreas: Moderate pancreatic atrophy but no mass, inflammation or ductal dilatation. Spleen: Normal size.  No focal lesions. Adrenals/Urinary Tract: Adrenal glands and kidneys are unremarkable. The bladder is unremarkable. Stomach/Bowel: Progressive small-bowel obstruction with marked distention of the stomach duodenum and entire small bowel down into the pelvis. There appears to be a transition point associated with an intraluminal lesion. This lesion contains a small amount of gas centrally and is most likely a large gallstone. I believe was present on the prior study also  beyond this lesions the distal ileum is decompressed/normal in caliber all the way to the terminal ileum. The colon is decompressed. Vascular/Lymphatic: Stable advanced atherosclerotic calcifications involving the aorta and branch vessels but no aneurysm or dissection. No mesenteric or retroperitoneal mass or adenopathy. Reproductive: Surgically absent. Other: No free air or significant free fluid collections. Musculoskeletal: No  significant bony findings. Stable degenerative changes involving the lumbar spine. IMPRESSION: 1. Progressive small-bowel obstruction with significant gastric and small bowel distension. There is a transition point associated with an intraluminal lesion in the mid distal ileum in the mid pelvis. This is most likely a large gallstone (gallstone ileus). 2. Persistent intra and extrahepatic biliary dilatation without obvious cause. A distal stricture is possible. 3. Stable advanced atherosclerotic calcifications involving the aorta and branch vessels. 4. Severe chronic pulmonary fibrosis. 5. Aortic atherosclerosis. Aortic Atherosclerosis (ICD10-I70.0). Electronically Signed   By: Marijo Sanes M.D.   On: 08/21/2020 20:00   [place] EKG SITE RITE  Result Date: 08/21/2020 If Site Rite image not attached, placement could not be confirmed due to current cardiac rhythm.   Anti-infectives: Anti-infectives (From admission, onward)   Start     Dose/Rate Route Frequency Ordered Stop   08/19/20 1000  dapsone tablet 100 mg  Status:  Discontinued       Note to Pharmacy: TAKE 1 TABLET(100 MG) BY MOUTH DAILY     100 mg Oral Daily 08/18/20 1350 08/22/20 0805   08/18/20 1000  cefTRIAXone (ROCEPHIN) 1 g in sodium chloride 0.9 % 100 mL IVPB        1 g 200 mL/hr over 30 Minutes Intravenous Every 24 hours 08/17/20 1414     08/17/20 0615  cefTRIAXone (ROCEPHIN) 1 g in sodium chloride 0.9 % 100 mL IVPB        1 g 200 mL/hr over 30 Minutes Intravenous  Once 08/17/20 0605 08/17/20 0801       Assessment/Plan 87 yo female with pulmonary fibrosis on oxygen, steroids and cellcept, presenting with SBO. I reviewed her CT scan, which was read as gallstone ileus. There does appear to be a transition in the pelvis with distal decompressed small bowel, with adjacent small bowel thickening. For the patient to pass a gallstone large enough to cause an obstruction a cholecystoenteric fistula would need to be present, and there is no gas  in the gallbladder to suggest this. In addition there are no visible stones in the gallbladder on CT, although they could be radiolucent. Clinical picture is much more consistent with an adhesive SBO from prior pelvic surgeries. I discussed with the patient that she needs an NG for decompression, and most adhesive obstructions will resolve with NG decompression alone. NG tube placement has previously been unsuccessful but she is willing to try again today. If obstruction fails to resolve after 1-2 days of NG decompression then abdominal exploration will likely be warranted. I discussed this with the patient. She understandably wants to avoid surgery if possible given her comorbidities.    LOS: 5 days    Michaelle Birks, MD [facility] General, Hepatobiliary and Pancreatic Surgery 08/22/20 9:14 AM

## 2020-08-22 NOTE — Progress Notes (Signed)
PROGRESS NOTE    [name] [name]  UYQ:[id number] DOB: 30-Oct-1930 DOA: 08/17/2020 PCP: Hoyt Koch, MD   Chief Complaint  Patient presents with  . Abdominal Pain  Brief Narrative: 87 year old female with history of chronic hypoxic respiratory failure on 2 L nasal cannula secondary to ILD/pulmonary fibrosis,rheumatoid arthritis on chronic prednisone, CAD/CHF, dyslipidemia, history of DVT, presented with nausea vomiting abdominal pain, onset 3 days prior to admission.Patient seen in the ED 2/7 reported having a bowel movement a week prior to admission in the ED found to have SBO, fecal impaction disimpacted x2 with enema, [facility] was consulted for SBO and was admitted for further management. 2/8- started on CLD, but had episode of vomiting on 2/9 - had Xray abd that showed normal bowel gas pattern. She is followed closely by general surgery.  Again had episode of vomiting 2/10 am, surgery plan for enema and repeat CT abdomen if not improving.Nephew wanted to look into second opinion at [facility]  And discussed w/ Transfer center at [facility] and they are at capacity and they have declined transfer. 2/11- GI also consulted on board. repeat CT abdomen done.  Subjective:  Seen and examined this morning.  Patient complains of nausea but no vomiting, abdomen is not distended but tender on palpation.  No bowel movement or flatus.  Assessment & Plan:  SBO: No BM or flatus, mild nausea but no vomiting.  Underwent repeat CT abdomen 2/11-showing progressive small bowel obstruction with significant gastric and small bowel distention, transition point associated with an intraluminal lesion in the mid distal ileum in the mid pelvis, most likely a large gallstone and concern for gallstone ileus, per surgery for  Patient to pass gallstone large enough to cause an obstruction, cholecystoenteric fistula will needs to be present and there was no gas in the gallbladder to suggest this, and Dr. Zenia Resides clinically appears  to be adhesive SBO from previous surgeries, and he is planning for NG decompression and patient appears to be willing to try although she had not tolerated previous NG attempts. I will keep her on IV fluids.  Defer further management to [facility] team who are following very closely.GI on board and appreciate input.  Pharmacy has been consulted for TPN and PICC line has been placed today.  UTI on admission-has been on ceftriaxone.No culture sent priot to antibitoics in ED. Repeat UA/culture pending. Will cont antibiotics for now .has had persistent leukocytosis, but she is on a chronic steroid currently switched to iv while not able to take po.  Recent Labs  Lab 08/18/20 0527 08/19/20 0614 08/20/20 0617 08/21/20 0613 08/22/20 0634  WBC 14.6* 12.6* 13.4* 14.9* 16.6*   Chronic hypoxic respiratory failure on 2 L [place]:She is on 2 L nasal cannula continue the same.    ILD/pulmonary fibrosis/Rheumatoid arthritis on chronic prednisone, Ofev, CellCept/Remicade outpatient infusion: Unable to take p.o. switch to iv hydrocortisone for now as npo- pharmacy dosing-discussed. I have communicated to her Pulmonologist Dr Vaughan Browner and okay to hold her ILD meds for now.   Hx of CAD/CHF ( D) /Dyslipidemia: Euvolemic, watch for fluid retention.  Remains on IV fluid hydration due to SBO.  Normally she takes Lasix 20 mg as needed for fluid or edema and currently has no leg edema no orthopne.She has not been taking aspirin and metoprolol at home prior to admission.  History of DVT not on anticoagulation due to history of falls.  Normocytic anemia hemoglobin remains overall stable.   Recent Labs  Lab 08/18/20 0527 08/19/20  3149 08/20/20 0617 08/21/20 0613 08/22/20 0634  HGB 11.7* 11.6* 12.9 12.1 12.2  HCT 37.6 37.8 42.6 39.4 40.4   Goals of care full code.  Multiple comorbidities, with significant pulmonary disease, chronic respiratory failure now with SBO-debilitated.palliative care consultation has been requested to  follow along, continue to optimize her medical condition.   Nutrition Status: Severe protein calorie malnourishment present, augment diet as per dietitian. bmi is low at 16.  Underwent PICC line placement today and will start TPN.  Pharmacy dietitian input appreciated Nutrition Problem: Severe Malnutrition Etiology: chronic illness (ILD/pulmonary fibrosis) Signs/Symptoms: severe fat depletion,severe muscle depletion Interventions: MVI,Boost Plus,Refer to RD note for recommendations  Diet Order            Diet NPO time specified  Diet effective now                DVT prophylaxis: enoxaparin (LOVENOX) injection 30 mg Start: 08/17/20 2200 Code Status:   Code Status: Full Code  Family Communication: plan of care discussed with patient at bedside again today. I had extensive discussion with patient's nephew and is aware about the plan of care as per surgery team. I have called him again today and updated  Status is: Inpatient Remains inpatient appropriate because:IV treatments appropriate due to intensity of illness or inability to take PO and Inpatient level of care appropriate due to severity of illness  Dispo: The patient is from: Home              Anticipated d/c is to: tbd               Anticipated d/c date is: >3 days              Patient currently is not medically stable to d/c.   Difficult to place patient No  Consultants: [facility] GI PALLIATIVE MEDICINE   Procedures:see note  Culture/Microbiology Other culture-see note  Medications: Scheduled Meds: . bisacodyl  10 mg Rectal Daily  . enoxaparin (LOVENOX) injection  30 mg Subcutaneous Q24H  . hydrocortisone sod succinate (SOLU-CORTEF) inj  50 mg Intravenous Daily  . polyethylene glycol  17 g Oral BID   Continuous Infusions: . sodium chloride 50 mL/hr at 08/22/20 0731  . sodium chloride    . cefTRIAXone (ROCEPHIN)  IV 1 g (08/21/20 1033)  . famotidine (PEPCID) IV 20 mg (08/21/20 1112)  . potassium PHOSPHATE IVPB (in  mmol)    . TPN ADULT (ION)      Antimicrobials: Anti-infectives (From admission, onward)   Start     Dose/Rate Route Frequency Ordered Stop   08/19/20 1000  dapsone tablet 100 mg  Status:  Discontinued       Note to Pharmacy: TAKE 1 TABLET(100 MG) BY MOUTH DAILY     100 mg Oral Daily 08/18/20 1350 08/22/20 0805   08/18/20 1000  cefTRIAXone (ROCEPHIN) 1 g in sodium chloride 0.9 % 100 mL IVPB        1 g 200 mL/hr over 30 Minutes Intravenous Every 24 hours 08/17/20 1414     08/17/20 0615  cefTRIAXone (ROCEPHIN) 1 g in sodium chloride 0.9 % 100 mL IVPB        1 g 200 mL/hr over 30 Minutes Intravenous  Once 08/17/20 0605 08/17/20 0801     Objective: Vitals: Today's Vitals   08/21/20 2218 08/22/20 0010 08/22/20 0731 08/22/20 0732  BP: 128/75  128/78   Pulse: 98 (!) 105 (!) 101   Resp: 17  16  Temp: 98.8 F (37.1 C)  98.7 F (37.1 C)   TempSrc: Oral  Oral   SpO2: (!) 88% 94% 90%   Weight:      Height:      PainSc:    0-No pain    Intake/Output Summary (Last 24 hours) at 08/22/2020 0945 Last data filed at 08/22/2020 0509 Gross per 24 hour  Intake --  Output 50 ml  Net -50 ml   Filed Weights   08/17/20 0326  Weight: 44.5 kg   Weight change:   Intake/Output from previous day: 02/11 0701 - 02/12 0700 In: -  Out: 50 [Urine:50] Intake/Output this shift: No intake/output data recorded. Filed Weights   08/17/20 0326  Weight: 44.5 kg   Examination: General exam: Alert awake, ill and frail looking.  Thin and cachectic. HEENT:Oral mucosa moist, Ear/Nose WNL grossly, dentition normal. Respiratory system: bilaterally crackles present on the posterior lung from her ILD-appears unchanged, no respiratory accessory muscle use. Cardiovascular system: S1 & S2 +, No JVD,. Gastrointestinal system: Abdomen soft, full, mild diffuse tenderness present, bowel sounds absent Nervous System:Alert, awake, moving extremities and grossly nonfocal Extremities: No edema, distal peripheral  pulses palpable.  Skin: No rashes,no icterus. MSK: thin muscle bulk,tone, power  Data Reviewed: I have personally reviewed following labs and imaging studies CBC: Recent Labs  Lab 08/17/20 0410 08/17/20 2025 08/18/20 0527 08/19/20 5462 08/20/20 0617 08/21/20 0613 08/22/20 0634  WBC 12.8*   < > 14.6* 12.6* 13.4* 14.9* 16.6*  NEUTROABS 8.9*  --   --   --   --   --  12.5*  HGB 11.5*   < > 11.7* 11.6* 12.9 12.1 12.2  HCT 36.6   < > 37.6 37.8 42.6 39.4 40.4  MCV 98.7   < > 100.3* 100.8* 101.2* 99.2 101.0*  PLT 291   < > 292 292 310 303 262   < > = values in this interval not displayed.   Basic Metabolic Panel: Recent Labs  Lab 08/18/20 0527 08/19/20 0614 08/20/20 0617 08/21/20 0613 08/22/20 0634  NA 139 141 142 143 146*  K 4.0 4.1 3.9 3.7 3.8  CL 98 97* 98 103 104  CO2 28 29 27 25 27   GLUCOSE 105* 108* 94 82 80  BUN 14 20 31* 25* 24*  CREATININE 0.80 0.84 0.91 0.76 0.80  CALCIUM 9.3 9.4 9.6 9.2 9.5  MG  --   --   --   --  1.9  PHOS  --   --   --   --  1.7*   GFR: Estimated Creatinine Clearance: 34.8 mL/min (by C-G formula based on SCr of 0.8 mg/dL). Liver Function Tests: Recent Labs  Lab 08/17/20 0410 08/18/20 0527 08/21/20 0613 08/22/20 0634  AST 25 20 17 17   ALT 17 15 12 12   ALKPHOS 78 75 62 66  BILITOT 1.0 1.0 1.3* 1.0  PROT 7.1 6.9 7.0 6.8  ALBUMIN 3.4* 3.4* 3.2* 3.3*   Recent Labs  Lab 08/17/20 0410  LIPASE 27   No results for input(s): AMMONIA in the last 168 hours. Coagulation Profile: No results for input(s): INR, PROTIME in the last 168 hours. Cardiac Enzymes: No results for input(s): CKTOTAL, CKMB, CKMBINDEX, TROPONINI in the last 168 hours. BNP (last 3 results) Recent Labs    06/03/20 1001  PROBNP 100   HbA1C: No results for input(s): HGBA1C in the last 72 hours. CBG: No results for input(s): GLUCAP in the last 168 hours. Lipid Profile: Recent Labs  08/22/20 0634  TRIG 88   Thyroid Function Tests: No results for input(s):  TSH, T4TOTAL, FREET4, T3FREE, THYROIDAB in the last 72 hours. Anemia Panel: No results for input(s): VITAMINB12, FOLATE, FERRITIN, TIBC, IRON, RETICCTPCT in the last 72 hours. Sepsis Labs: No results for input(s): PROCALCITON, LATICACIDVEN in the last 168 hours.  Recent Results (from the past 240 hour(s))  SARS CORONAVIRUS 2 (TAT 6-24 HRS) Nasopharyngeal Nasopharyngeal Swab     Status: None   Collection Time: 08/17/20 12:28 PM   Specimen: Nasopharyngeal Swab  Result Value Ref Range Status   SARS Coronavirus 2 NEGATIVE NEGATIVE Final    Comment: (NOTE) SARS-CoV-2 target nucleic acids are NOT DETECTED.  The SARS-CoV-2 RNA is generally detectable in upper and lower respiratory specimens during the acute phase of infection. Negative results do not preclude SARS-CoV-2 infection, do not rule out co-infections with other pathogens, and should not be used as the sole basis for treatment or other patient management decisions. Negative results must be combined with clinical observations, patient history, and epidemiological information. The expected result is Negative.  Fact Sheet for Patients: SugarRoll.be  Fact Sheet for Healthcare Providers: https://www.woods-mathews.com/  This test is not yet approved or cleared by the [place] FDA and  has been authorized for detection and/or diagnosis of SARS-CoV-2 by FDA under an Emergency Use Authorization (EUA). This EUA will remain  in effect (meaning this test can be used) for the duration of the COVID-19 declaration under Se ction 564(b)(1) of the Act, 21 U.S.C. section 360bbb-3(b)(1), unless the authorization is terminated or revoked sooner.  Performed at [facility], [place] [address] [place], [place] [address]      Radiology Studies: CT ABDOMEN PELVIS W CONTRAST  Result Date: 08/21/2020 CLINICAL DATA:  Follow-up small bowel obstruction. EXAM: CT ABDOMEN AND PELVIS WITH CONTRAST  TECHNIQUE: Multidetector CT imaging of the abdomen and pelvis was performed using the standard protocol following bolus administration of intravenous contrast. CONTRAST:  51mL OMNIPAQUE IOHEXOL 300 MG/ML  SOLN COMPARISON:  08/17/2020 CT scan. FINDINGS: Lower chest: Severe chronic pulmonary fibrosis again demonstrated. The heart is within normal limits in size. Stable aortic and coronary artery calcifications. Hepatobiliary: No hepatic lesions are identified. Persistent central intrahepatic biliary dilatation significant common bile duct dilatation measuring up to 15 mm. Gallbladder is mildly distended. No obvious gallstones. Pancreas: Moderate pancreatic atrophy but no mass, inflammation or ductal dilatation. Spleen: Normal size.  No focal lesions. Adrenals/Urinary Tract: Adrenal glands and kidneys are unremarkable. The bladder is unremarkable. Stomach/Bowel: Progressive small-bowel obstruction with marked distention of the stomach duodenum and entire small bowel down into the pelvis. There appears to be a transition point associated with an intraluminal lesion. This lesion contains a small amount of gas centrally and is most likely a large gallstone. I believe was present on the prior study also beyond this lesions the distal ileum is decompressed/normal in caliber all the way to the terminal ileum. The colon is decompressed. Vascular/Lymphatic: Stable advanced atherosclerotic calcifications involving the aorta and branch vessels but no aneurysm or dissection. No mesenteric or retroperitoneal mass or adenopathy. Reproductive: Surgically absent. Other: No free air or significant free fluid collections. Musculoskeletal: No significant bony findings. Stable degenerative changes involving the lumbar spine. IMPRESSION: 1. Progressive small-bowel obstruction with significant gastric and small bowel distension. There is a transition point associated with an intraluminal lesion in the mid distal ileum in the mid pelvis.  This is most likely a large gallstone (gallstone ileus). 2. Persistent intra and  extrahepatic biliary dilatation without obvious cause. A distal stricture is possible. 3. Stable advanced atherosclerotic calcifications involving the aorta and branch vessels. 4. Severe chronic pulmonary fibrosis. 5. Aortic atherosclerosis. Aortic Atherosclerosis (ICD10-I70.0). Electronically Signed   By: Marijo Sanes M.D.   On: 08/21/2020 20:00   [place] EKG SITE RITE  Result Date: 08/21/2020 If Site Rite image not attached, placement could not be confirmed due to current cardiac rhythm.    LOS: 5 days   Antonieta Pert, MD [facility]  08/22/2020, 9:45 AM

## 2020-08-22 NOTE — Progress Notes (Signed)
Peripherally Inserted Central Catheter Placement  The IV Nurse has discussed with the patient and/or persons authorized to consent for the patient, the purpose of this procedure and the potential benefits and risks involved with this procedure.  The benefits include less needle sticks, lab draws from the catheter, and the patient may be discharged home with the catheter. Risks include, but not limited to, infection, bleeding, blood clot (thrombus formation), and puncture of an artery; nerve damage and irregular heartbeat and possibility to perform a PICC exchange if needed/ordered by physician.  Alternatives to this procedure were also discussed.  Bard Power PICC patient education guide, fact sheet on infection prevention and patient information card has been provided to patient /or left at bedside.  PICC placed by Jake Samples, RN  PICC Placement Documentation  PICC Double Lumen 08/22/20 PICC Right Brachial 34 cm 0 cm (Active)  Indication for Insertion or Continuance of Line Administration of hyperosmolar/irritating solutions (i.e. TPN, Vancomycin, etc.) 08/22/20 1045  Exposed Catheter (cm) 0 cm 08/22/20 1045  Site Assessment Clean;Dry;Intact 08/22/20 1045  Lumen #1 Status Flushed;Saline locked;Blood return noted 08/22/20 1045  Lumen #2 Status Flushed;Saline locked;Blood return noted 08/22/20 1045  Dressing Type Transparent 08/22/20 1045  Dressing Status Clean;Dry;Intact 08/22/20 1045  Antimicrobial disc in place? Yes 08/22/20 1045  Safety Lock Not Applicable 15/40/08 6761  Line Care Connections checked and tightened 08/22/20 1045  Line Adjustment (NICU/IV Team Only) No 08/22/20 1045  Dressing Intervention New dressing 08/22/20 1045  Dressing Change Due 08/29/20 08/22/20 [place], Nicolette Bang 08/22/2020, 10:46 AM

## 2020-08-23 ENCOUNTER — Encounter (HOSPITAL_COMMUNITY)
Admission: EM | Disposition: A | Discharge status: Skilled Nursing Facility | Payer: Self-pay | Source: Home / Self Care | Attending: Internal Medicine

## 2020-08-23 ENCOUNTER — Inpatient Hospital Stay (HOSPITAL_COMMUNITY): Payer: Medicare Other | Admitting: Registered Nurse

## 2020-08-23 DIAGNOSIS — J9621 Acute and chronic respiratory failure with hypoxia: Secondary | ICD-10-CM | POA: Diagnosis not present

## 2020-08-23 DIAGNOSIS — E870 Hyperosmolality and hypernatremia: Secondary | ICD-10-CM | POA: Diagnosis not present

## 2020-08-23 DIAGNOSIS — D509 Iron deficiency anemia, unspecified: Secondary | ICD-10-CM | POA: Diagnosis not present

## 2020-08-23 DIAGNOSIS — K56609 Unspecified intestinal obstruction, unspecified as to partial versus complete obstruction: Secondary | ICD-10-CM | POA: Diagnosis not present

## 2020-08-23 DIAGNOSIS — J849 Interstitial pulmonary disease, unspecified: Secondary | ICD-10-CM

## 2020-08-23 DIAGNOSIS — K5710 Diverticulosis of small intestine without perforation or abscess without bleeding: Secondary | ICD-10-CM | POA: Diagnosis not present

## 2020-08-23 DIAGNOSIS — I9581 Postprocedural hypotension: Secondary | ICD-10-CM | POA: Diagnosis not present

## 2020-08-23 DIAGNOSIS — E785 Hyperlipidemia, unspecified: Secondary | ICD-10-CM | POA: Diagnosis not present

## 2020-08-23 HISTORY — PX: LAPAROTOMY: SHX154

## 2020-08-23 LAB — COMPREHENSIVE METABOLIC PANEL
ALT: 11 U/L (ref 0–44)
AST: 20 U/L (ref 15–41)
Albumin: 2.8 g/dL — ABNORMAL LOW (ref 3.5–5.0)
Alkaline Phosphatase: 55 U/L (ref 38–126)
Anion gap: 10 (ref 5–15)
BUN: 23 mg/dL (ref 8–23)
CO2: 26 mmol/L (ref 22–32)
Calcium: 9 mg/dL (ref 8.9–10.3)
Chloride: 109 mmol/L (ref 98–111)
Creatinine, Ser: 0.71 mg/dL (ref 0.44–1.00)
GFR, Estimated: >60 mL/min (ref >60)
Glucose, Bld: 216 mg/dL — ABNORMAL HIGH (ref 70–99)
Potassium: 3.5 mmol/L (ref 3.5–5.1)
Sodium: 145 mmol/L (ref 135–145)
Total Bilirubin: 0.7 mg/dL (ref 0.3–1.2)
Total Protein: 5.9 g/dL — ABNORMAL LOW (ref 6.5–8.1)

## 2020-08-23 LAB — POCT I-STAT 7, (LYTES, BLD GAS, ICA,H+H)
Acid-Base Excess: 3 mmol/L — ABNORMAL HIGH (ref 0.0–2.0)
Bicarbonate: 28.7 mmol/L — ABNORMAL HIGH (ref 20.0–28.0)
Calcium, Ion: 1.27 mmol/L (ref 1.15–1.40)
HCT: 33 % — ABNORMAL LOW (ref 36.0–46.0)
Hemoglobin: 11.2 g/dL — ABNORMAL LOW (ref 12.0–15.0)
O2 Saturation: 100 %
Potassium: 3.5 mmol/L (ref 3.5–5.1)
Sodium: 147 mmol/L — ABNORMAL HIGH (ref 135–145)
TCO2: 30 mmol/L (ref 22–32)
pCO2 arterial: 50.8 mmHg — ABNORMAL HIGH (ref 32.0–48.0)
pH, Arterial: 7.361 (ref 7.350–7.450)
pO2, Arterial: 341 mmHg — ABNORMAL HIGH (ref 83.0–108.0)

## 2020-08-23 LAB — GLUCOSE, CAPILLARY
Glucose-Capillary: 186 mg/dL — ABNORMAL HIGH (ref 70–99)
Glucose-Capillary: 206 mg/dL — ABNORMAL HIGH (ref 70–99)
Glucose-Capillary: 196 mg/dL — ABNORMAL HIGH (ref 70–99)

## 2020-08-23 LAB — CBC
HCT: 36.8 % (ref 36.0–46.0)
Hemoglobin: 11.5 g/dL — ABNORMAL LOW (ref 12.0–15.0)
MCH: 31 pg (ref 26.0–34.0)
MCHC: 31.3 g/dL (ref 30.0–36.0)
MCV: 99.2 fL (ref 80.0–100.0)
Platelets: 201 10*3/uL (ref 150–400)
RBC: 3.71 MIL/uL — ABNORMAL LOW (ref 3.87–5.11)
RDW: 14 % (ref 11.5–15.5)
WBC: 14.3 10*3/uL — ABNORMAL HIGH (ref 4.0–10.5)
nRBC: 0 % (ref 0.0–0.2)

## 2020-08-23 LAB — MAGNESIUM: Magnesium: 1.8 mg/dL (ref 1.7–2.4)

## 2020-08-23 LAB — PHOSPHORUS: Phosphorus: 1.7 mg/dL — ABNORMAL LOW (ref 2.5–4.6)

## 2020-08-23 LAB — MRSA PCR SCREENING: MRSA by PCR: POSITIVE — AB

## 2020-08-23 SURGERY — LAPAROTOMY, EXPLORATORY
Anesthesia: General

## 2020-08-23 MED ORDER — PANTOPRAZOLE SODIUM 40 MG IV SOLR: 40.0000 mg | INTRAVENOUS | Status: DC

## 2020-08-23 MED ORDER — MAGNESIUM SULFATE 2 GM/50ML IV SOLN: 2.0000 g | Freq: Once | INTRAVENOUS | Status: DC

## 2020-08-23 MED ORDER — SODIUM CHLORIDE 0.45 % IV SOLN: INTRAVENOUS | Status: DC

## 2020-08-23 MED ORDER — MIDAZOLAM HCL 2 MG/2ML IJ SOLN
INTRAMUSCULAR | Status: AC
  Filled 2020-08-23: qty 2

## 2020-08-23 MED ORDER — SUCCINYLCHOLINE CHLORIDE 200 MG/10ML IV SOSY
PREFILLED_SYRINGE | INTRAVENOUS | Status: DC | PRN
  Administered 2020-08-23: 80 mg via INTRAVENOUS

## 2020-08-23 MED ORDER — FENTANYL CITRATE (PF) 100 MCG/2ML IJ SOLN
INTRAMUSCULAR | Status: DC | PRN
  Administered 2020-08-23: 100 ug via INTRAVENOUS
  Administered 2020-08-23: 50 ug via INTRAVENOUS
  Administered 2020-08-23: 100 ug via INTRAVENOUS
  Administered 2020-08-23 (×2): 25 ug via INTRAVENOUS

## 2020-08-23 MED ORDER — HYDROMORPHONE HCL 2 MG/ML IJ SOLN
INTRAMUSCULAR | Status: AC
  Filled 2020-08-23: qty 1

## 2020-08-23 MED ORDER — ROCURONIUM BROMIDE 10 MG/ML (PF) SYRINGE
PREFILLED_SYRINGE | INTRAVENOUS | Status: AC
  Filled 2020-08-23: qty 10

## 2020-08-23 MED ORDER — SUCCINYLCHOLINE CHLORIDE 200 MG/10ML IV SOSY
PREFILLED_SYRINGE | INTRAVENOUS | Status: AC
  Filled 2020-08-23: qty 10

## 2020-08-23 MED ORDER — SUGAMMADEX SODIUM 200 MG/2ML IV SOLN
INTRAVENOUS | Status: DC | PRN
  Administered 2020-08-23: 100 mg via INTRAVENOUS

## 2020-08-23 MED ORDER — PHENYLEPHRINE HCL (PRESSORS) 10 MG/ML IV SOLN
INTRAVENOUS | Status: AC
  Filled 2020-08-23: qty 1

## 2020-08-23 MED ORDER — FENTANYL CITRATE (PF) 100 MCG/2ML IJ SOLN
INTRAMUSCULAR | Status: AC
  Filled 2020-08-23: qty 2

## 2020-08-23 MED ORDER — ONDANSETRON HCL 4 MG/2ML IJ SOLN: 4.0000 mg | Freq: Once | INTRAMUSCULAR | Status: DC | PRN

## 2020-08-23 MED ORDER — LACTATED RINGERS IV SOLN: INTRAVENOUS | Status: DC | PRN

## 2020-08-23 MED ORDER — ETOMIDATE 2 MG/ML IV SOLN
INTRAVENOUS | Status: DC | PRN
  Administered 2020-08-23: 10 mg via INTRAVENOUS

## 2020-08-23 MED ORDER — PHENYLEPHRINE 40 MCG/ML (10ML) SYRINGE FOR IV PUSH (FOR BLOOD PRESSURE SUPPORT)
PREFILLED_SYRINGE | INTRAVENOUS | Status: AC
  Filled 2020-08-23: qty 10

## 2020-08-23 MED ORDER — HYDROCORTISONE NA SUCCINATE PF 100 MG IJ SOLR
100.0000 mg | Freq: Two times a day (BID) | INTRAMUSCULAR | Status: DC
  Administered 2020-08-23 – 2020-08-25 (×5): 100 mg via INTRAVENOUS
  Filled 2020-08-23 (×5): qty 2

## 2020-08-23 MED ORDER — CHLORHEXIDINE GLUCONATE 0.12 % MT SOLN
15.0000 mL | Freq: Two times a day (BID) | OROMUCOSAL | Status: DC
  Administered 2020-08-23 – 2020-09-07 (×31): 15 mL via OROMUCOSAL
  Filled 2020-08-23 (×32): qty 15

## 2020-08-23 MED ORDER — FENTANYL CITRATE (PF) 100 MCG/2ML IJ SOLN
25.0000 ug | INTRAMUSCULAR | Status: DC | PRN
  Administered 2020-08-23 (×4): 25 ug via INTRAVENOUS

## 2020-08-23 MED ORDER — ALBUMIN HUMAN 5 % IV SOLN: INTRAVENOUS | Status: DC | PRN

## 2020-08-23 MED ORDER — MUPIROCIN 2 % EX OINT
1.0000 "application " | TOPICAL_OINTMENT | Freq: Two times a day (BID) | CUTANEOUS | Status: AC
  Administered 2020-08-23 – 2020-08-28 (×10): 1 via NASAL
  Filled 2020-08-23 (×5): qty 22

## 2020-08-23 MED ORDER — ACETAMINOPHEN 10 MG/ML IV SOLN
INTRAVENOUS | Status: AC
  Filled 2020-08-23: qty 100

## 2020-08-23 MED ORDER — ONDANSETRON HCL 4 MG/2ML IJ SOLN
INTRAMUSCULAR | Status: DC | PRN
  Administered 2020-08-23: 4 mg via INTRAVENOUS

## 2020-08-23 MED ORDER — ESMOLOL HCL 100 MG/10ML IV SOLN
INTRAVENOUS | Status: DC | PRN
  Administered 2020-08-23 (×2): 20 mg via INTRAVENOUS

## 2020-08-23 MED ORDER — STERILE WATER FOR IRRIGATION IR SOLN
Status: DC | PRN
  Administered 2020-08-23: 1000 mL

## 2020-08-23 MED ORDER — LIDOCAINE HCL (PF) 2 % IJ SOLN
INTRAMUSCULAR | Status: AC
  Filled 2020-08-23: qty 5

## 2020-08-23 MED ORDER — CEFAZOLIN SODIUM-DEXTROSE 2-4 GM/100ML-% IV SOLN: 2.0000 g | INTRAVENOUS | Status: DC

## 2020-08-23 MED ORDER — FENTANYL CITRATE (PF) 250 MCG/5ML IJ SOLN
INTRAMUSCULAR | Status: AC
  Filled 2020-08-23: qty 5

## 2020-08-23 MED ORDER — INSULIN ASPART 100 UNIT/ML ~~LOC~~ SOLN
0.0000 [IU] | Freq: Four times a day (QID) | SUBCUTANEOUS | Status: DC
  Administered 2020-08-23: 2 [IU] via SUBCUTANEOUS
  Administered 2020-08-23: 3 [IU] via SUBCUTANEOUS
  Administered 2020-08-24 – 2020-08-25 (×5): 2 [IU] via SUBCUTANEOUS

## 2020-08-23 MED ORDER — ALBUMIN HUMAN 5 % IV SOLN
INTRAVENOUS | Status: AC
  Filled 2020-08-23: qty 250

## 2020-08-23 MED ORDER — DEXAMETHASONE SODIUM PHOSPHATE 10 MG/ML IJ SOLN
INTRAMUSCULAR | Status: AC
  Filled 2020-08-23: qty 1

## 2020-08-23 MED ORDER — 0.9 % SODIUM CHLORIDE (POUR BTL) OPTIME
TOPICAL | Status: DC | PRN
  Administered 2020-08-23: 2000 mL

## 2020-08-23 MED ORDER — PHENYLEPHRINE HCL-NACL 10-0.9 MG/250ML-% IV SOLN
INTRAVENOUS | Status: DC | PRN
  Administered 2020-08-23: 50 ug/min via INTRAVENOUS

## 2020-08-23 MED ORDER — TRAVASOL 10 % IV SOLN
INTRAVENOUS | Status: AC
  Filled 2020-08-23: qty 720

## 2020-08-23 MED ORDER — ACETAMINOPHEN 10 MG/ML IV SOLN
INTRAVENOUS | Status: DC | PRN
  Administered 2020-08-23: 600 mg via INTRAVENOUS

## 2020-08-23 MED ORDER — ETOMIDATE 2 MG/ML IV SOLN
INTRAVENOUS | Status: AC
  Filled 2020-08-23: qty 10

## 2020-08-23 MED ORDER — LIDOCAINE 2% (20 MG/ML) 5 ML SYRINGE
INTRAMUSCULAR | Status: DC | PRN
  Administered 2020-08-23: 50 mg via INTRAVENOUS

## 2020-08-23 MED ORDER — METRONIDAZOLE IN NACL 5-0.79 MG/ML-% IV SOLN
500.0000 mg | INTRAVENOUS | Status: AC
  Administered 2020-08-23: 500 mg via INTRAVENOUS
  Filled 2020-08-23: qty 100

## 2020-08-23 MED ORDER — ROCURONIUM BROMIDE 10 MG/ML (PF) SYRINGE
PREFILLED_SYRINGE | INTRAVENOUS | Status: DC | PRN
  Administered 2020-08-23: 50 mg via INTRAVENOUS

## 2020-08-23 MED ORDER — MAGNESIUM SULFATE 2 GM/50ML IV SOLN
2.0000 g | Freq: Once | INTRAVENOUS | Status: AC
  Administered 2020-08-23: 2 g via INTRAVENOUS
  Filled 2020-08-23: qty 50

## 2020-08-23 MED ORDER — PROPOFOL 10 MG/ML IV BOLUS
INTRAVENOUS | Status: AC
  Filled 2020-08-23: qty 20

## 2020-08-23 MED ORDER — DEXAMETHASONE SODIUM PHOSPHATE 10 MG/ML IJ SOLN
INTRAMUSCULAR | Status: DC | PRN
  Administered 2020-08-23: 5 mg via INTRAVENOUS

## 2020-08-23 MED ORDER — CHLORHEXIDINE GLUCONATE CLOTH 2 % EX PADS: 6.0000 | MEDICATED_PAD | Freq: Once | CUTANEOUS | Status: DC

## 2020-08-23 MED ORDER — LACTATED RINGERS IV SOLN: Freq: Once | INTRAVENOUS | Status: DC

## 2020-08-23 MED ORDER — POTASSIUM PHOSPHATES 15 MMOLE/5ML IV SOLN
20.0000 mmol | Freq: Once | INTRAVENOUS | Status: AC
  Administered 2020-08-23: 20 mmol via INTRAVENOUS
  Filled 2020-08-23: qty 6.67

## 2020-08-23 MED ORDER — SODIUM CHLORIDE 0.9 % IV BOLUS
1000.0000 mL | Freq: Once | INTRAVENOUS | Status: AC
  Administered 2020-08-23: 1000 mL via INTRAVENOUS

## 2020-08-23 MED ORDER — ORAL CARE MOUTH RINSE
15.0000 mL | Freq: Two times a day (BID) | OROMUCOSAL | Status: DC
  Administered 2020-08-23 – 2020-09-07 (×26): 15 mL via OROMUCOSAL

## 2020-08-23 MED ORDER — CEFAZOLIN SODIUM-DEXTROSE 2-4 GM/100ML-% IV SOLN
INTRAVENOUS | Status: AC
  Filled 2020-08-23: qty 100

## 2020-08-23 MED ORDER — ONDANSETRON HCL 4 MG/2ML IJ SOLN
INTRAMUSCULAR | Status: AC
  Filled 2020-08-23: qty 2

## 2020-08-23 SURGICAL SUPPLY — 38 items
APL PRP STRL LF DISP 70% ISPRP (MISCELLANEOUS)
BINDER ABDOMINAL 12 ML 46-62 (SOFTGOODS) ×3 IMPLANT
CATH GASTROSTOMY 24FR (CATHETERS) ×3 IMPLANT
CHLORAPREP W/TINT 26 (MISCELLANEOUS) IMPLANT
COVER MAYO STAND STRL (DRAPES) ×3 IMPLANT
COVER SURGICAL LIGHT HANDLE (MISCELLANEOUS) ×3 IMPLANT
COVER WAND RF STERILE (DRAPES) IMPLANT
DRAIN CHANNEL 19F RND (DRAIN) IMPLANT
DRAPE LAPAROSCOPIC ABDOMINAL (DRAPES) ×3 IMPLANT
DRSG OPSITE POSTOP 4X10 (GAUZE/BANDAGES/DRESSINGS) ×2 IMPLANT
DRSG OPSITE POSTOP 4X6 (GAUZE/BANDAGES/DRESSINGS) IMPLANT
DRSG OPSITE POSTOP 4X8 (GAUZE/BANDAGES/DRESSINGS) IMPLANT
ELECT REM PT RETURN 15FT ADLT (MISCELLANEOUS) ×3 IMPLANT
EVACUATOR SILICONE 100CC (DRAIN) IMPLANT
G-TUBE MIC 24F 7-10 BALLOON (CATHETERS) IMPLANT
GLOVE SURG ENC MOIS LTX SZ6 (GLOVE) ×6 IMPLANT
GLOVE SURG UNDER LTX SZ6.5 (GLOVE) ×6 IMPLANT
GOWN STRL REUS W/TWL LRG LVL3 (GOWN DISPOSABLE) ×3 IMPLANT
GOWN STRL REUS W/TWL XL LVL3 (GOWN DISPOSABLE) ×3 IMPLANT
KIT TURNOVER KIT A (KITS) ×3 IMPLANT
PACK GENERAL/GYN (CUSTOM PROCEDURE TRAY) ×3 IMPLANT
PENCIL SMOKE EVACUATOR (MISCELLANEOUS) IMPLANT
SPONGE DRAIN TRACH 4X4 STRL 2S (GAUZE/BANDAGES/DRESSINGS) ×2 IMPLANT
SPONGE LAP 18X18 RF (DISPOSABLE) IMPLANT
STAPLER VISISTAT 35W (STAPLE) IMPLANT
SUT ETHILON 3 0 PS 1 (SUTURE) IMPLANT
SUT MNCRL AB 4-0 PS2 18 (SUTURE) IMPLANT
SUT NOVA T20/GS 25 (SUTURE) IMPLANT
SUT SILK 2 0 (SUTURE) ×3
SUT SILK 2 0 SH CR/8 (SUTURE) ×3 IMPLANT
SUT SILK 2-0 18XBRD TIE 12 (SUTURE) ×1 IMPLANT
SUT SILK 3 0 (SUTURE) ×3
SUT SILK 3 0 SH CR/8 (SUTURE) ×3 IMPLANT
SUT SILK 3-0 18XBRD TIE 12 (SUTURE) ×1 IMPLANT
TOWEL OR 17X26 10 PK STRL BLUE (TOWEL DISPOSABLE) IMPLANT
TOWEL OR NON WOVEN STRL DISP B (DISPOSABLE) ×3 IMPLANT
TRAY FOLEY MTR SLVR 14FR STAT (SET/KITS/TRAYS/PACK) ×3 IMPLANT
TRAY FOLEY MTR SLVR 16FR STAT (SET/KITS/TRAYS/PACK) ×3 IMPLANT

## 2020-08-23 NOTE — Progress Notes (Signed)
This RN received patient from PACU via bed with PACU staff. Patient is alert, oriented to self. Received on room air at 79%. Placed patient on 3L [place] and saturations quickly rose to 93%. Patient has a L EJ and R PICC in place. Abdominal binder covering post op honeycomb dressing. Gastric tube in place in LUQ draining dark green to a drainage bag. Foley catheter in place to gravity. Patient oriented to room, placed in bilateral soft mitts for protection against removing objections while recovering from anesthesia.

## 2020-08-23 NOTE — Progress Notes (Signed)
Subjective: Multiple attempts at NG placement yesterday were unsuccessful, even under fluoro. Patient has not had any bowel movements. She seems mildly confused this morning and is having more abdominal pain.  Objective: Vital signs in last 24 hours: Temp:  [97.6 F (36.4 C)-98.1 F (36.7 C)] 98.1 F (36.7 C) (02/13 0512) Pulse Rate:  [94-109] 94 (02/13 0512) Resp:  [14-16] 14 (02/13 0512) BP: (102-148)/(76-98) 102/76 (02/13 0512) SpO2:  [90 %-99 %] 93 % (02/13 0512) Last BM Date: 08/21/20  Intake/Output from previous day: 02/12 0701 - 02/13 0700 In: 1089.9 [I.V.:789.9; IV Piggyback:300] Out: -  Intake/Output this shift: No intake/output data recorded.  PE: General: resting comfortably, NAD. Appears thin and malnourished. Neuro: alert, mildly confused, no focal deficits Resp: normal work of breathing Abdomen: mildly distended but soft, tender to palpation in lower abdomen. Well-healed lower midline surgical scar. Extremities: warm and well-perfused   Lab Results:  Recent Labs    08/22/20 0634 08/23/20 0613  WBC 16.6* 14.3*  HGB 12.2 11.5*  HCT 40.4 36.8  PLT 262 201   BMET Recent Labs    08/22/20 0634 08/23/20 0613  NA 146* 145  K 3.8 3.5  CL 104 109  CO2 27 26  GLUCOSE 80 216*  BUN 24* 23  CREATININE 0.80 0.71  CALCIUM 9.5 9.0   PT/INR No results for input(s): LABPROT, INR in the last 72 hours. CMP     Component Value Date/Time   NA 145 08/23/2020 0613   NA 138 04/29/2020 0948   K 3.5 08/23/2020 0613   CL 109 08/23/2020 0613   CO2 26 08/23/2020 0613   GLUCOSE 216 (H) 08/23/2020 0613   BUN 23 08/23/2020 0613   BUN 11 04/29/2020 0948   CREATININE 0.71 08/23/2020 0613   CREATININE 0.74 11/30/2015 1327   CALCIUM 9.0 08/23/2020 0613   PROT 5.9 (L) 08/23/2020 0613   PROT 6.5 04/29/2020 0948   ALBUMIN 2.8 (L) 08/23/2020 0613   ALBUMIN 3.8 04/29/2020 0948   AST 20 08/23/2020 0613   ALT 11 08/23/2020 0613   ALKPHOS 55 08/23/2020 0613    BILITOT 0.7 08/23/2020 0613   BILITOT 0.7 04/29/2020 0948   GFRNONAA >60 08/23/2020 0613   GFRAA 69 04/29/2020 0948   Lipase     Component Value Date/Time   LIPASE 27 08/17/2020 0410       Studies/Results: CT ABDOMEN PELVIS W CONTRAST  Result Date: 08/21/2020 CLINICAL DATA:  Follow-up small bowel obstruction. EXAM: CT ABDOMEN AND PELVIS WITH CONTRAST TECHNIQUE: Multidetector CT imaging of the abdomen and pelvis was performed using the standard protocol following bolus administration of intravenous contrast. CONTRAST:  32mL OMNIPAQUE IOHEXOL 300 MG/ML  SOLN COMPARISON:  08/17/2020 CT scan. FINDINGS: Lower chest: Severe chronic pulmonary fibrosis again demonstrated. The heart is within normal limits in size. Stable aortic and coronary artery calcifications. Hepatobiliary: No hepatic lesions are identified. Persistent central intrahepatic biliary dilatation significant common bile duct dilatation measuring up to 15 mm. Gallbladder is mildly distended. No obvious gallstones. Pancreas: Moderate pancreatic atrophy but no mass, inflammation or ductal dilatation. Spleen: Normal size.  No focal lesions. Adrenals/Urinary Tract: Adrenal glands and kidneys are unremarkable. The bladder is unremarkable. Stomach/Bowel: Progressive small-bowel obstruction with marked distention of the stomach duodenum and entire small bowel down into the pelvis. There appears to be a transition point associated with an intraluminal lesion. This lesion contains a small amount of gas centrally and is most likely a large gallstone. I believe  was present on the prior study also beyond this lesions the distal ileum is decompressed/normal in caliber all the way to the terminal ileum. The colon is decompressed. Vascular/Lymphatic: Stable advanced atherosclerotic calcifications involving the aorta and branch vessels but no aneurysm or dissection. No mesenteric or retroperitoneal mass or adenopathy. Reproductive: Surgically absent. Other:  No free air or significant free fluid collections. Musculoskeletal: No significant bony findings. Stable degenerative changes involving the lumbar spine. IMPRESSION: 1. Progressive small-bowel obstruction with significant gastric and small bowel distension. There is a transition point associated with an intraluminal lesion in the mid distal ileum in the mid pelvis. This is most likely a large gallstone (gallstone ileus). 2. Persistent intra and extrahepatic biliary dilatation without obvious cause. A distal stricture is possible. 3. Stable advanced atherosclerotic calcifications involving the aorta and branch vessels. 4. Severe chronic pulmonary fibrosis. 5. Aortic atherosclerosis. Aortic Atherosclerosis (ICD10-I70.0). Electronically Signed   By: Marijo Sanes M.D.   On: 08/21/2020 20:00   DG [facility] Dys Tube [facility] W/Fl W/Rad  Result Date: 08/22/2020 CLINICAL DATA:  Inpatient. Small bowel obstruction. Nasogastric feeding tube placement requested under fluoroscopic guidance. EXAM: NASO G TUBE PLACEMENT WITH FL AND WITH RAD CONTRAST:  None. FLUOROSCOPY TIME:  Fluoroscopy Time:  1 minutes 6 seconds Radiation Exposure Index (if provided by the fluoroscopic device): 3.8 mGy Number of Acquired Spot Images: 0 COMPARISON:  08/21/2020 CT abdomen/pelvis. 01/24/2020 chest CT angiogram. FINDINGS: Nasogastric tube placement was attempted under fluoroscopic guidance, however was unsuccessful due to inability to pass the tube beyond the posterior nasopharynx. IMPRESSION: Technically unsuccessful attempted nasogastric tube placement under fluoroscopic guidance due to obstruction at the level of the posterior nasopharynx. Electronically Signed   By: Ilona Sorrel M.D.   On: 08/22/2020 14:25   DG ATTEMPTED STUDY-NO REPORT  Result Date: 08/22/2020 There is no Radiologist interpretation  for this exam.  [place] EKG SITE RITE  Result Date: 08/21/2020 If Site Rite image not attached, placement could not be confirmed due to current cardiac  rhythm.   Anti-infectives: Anti-infectives (From admission, onward)   Start     Dose/Rate Route Frequency Ordered Stop   08/19/20 1000  dapsone tablet 100 mg  Status:  Discontinued       Note to Pharmacy: TAKE 1 TABLET(100 MG) BY MOUTH DAILY     100 mg Oral Daily 08/18/20 1350 08/22/20 0805   08/18/20 1000  cefTRIAXone (ROCEPHIN) 1 g in sodium chloride 0.9 % 100 mL IVPB        1 g 200 mL/hr over 30 Minutes Intravenous Every 24 hours 08/17/20 1414     08/17/20 0615  cefTRIAXone (ROCEPHIN) 1 g in sodium chloride 0.9 % 100 mL IVPB        1 g 200 mL/hr over 30 Minutes Intravenous  Once 08/17/20 0605 08/17/20 0801       Assessment/Plan 87 yo female with pulmonary fibrosis on oxygen, steroids and cellcept, presenting with SBO. Unfortunately multiple attempts at NG placement have not been successful due to inability to pass beyond the oropharynx. The patient is having increasing abdominal pain this morning and remains distended. I think this obstruction is unlikely to resolve without operative intervention at this point, and her abdominal exam is more concerning today. Patient is of course a high risk surgical candidate but I there are no other options at this point to treat her obstruction. She is agreeable to undergoing surgery today, which would included exploratory laparotomy, lysis of adhesions, possible bowel resection, and G tube  placement. I have discussed this with the patient and her nephew [name], and they agree to proceed with surgery. Will go to OR this morning.    LOS: 6 days    Michaelle Birks, MD [facility], Hepatobiliary and Pancreatic Surgery 08/23/20 8:09 AM

## 2020-08-23 NOTE — Progress Notes (Signed)
I met with the patient and her nephew at bedside to discuss the surgery. Discussed that I do not believe she will get better without surgery. At the very least an operation can help palliate her symptoms. Patient and nephew both agree to proceed, and the patient signed consent with her nephew present. Proceed to OR.

## 2020-08-23 NOTE — Op Note (Signed)
Date: 08/23/20  Patient: [name] [name] MRN: [id number]  Preoperative Diagnosis: Small bowel obstruction Postoperative Diagnosis: Same  Procedure: Exploratory laparotomy, detorsion of small bowel, placement of Stamm gastrostomy tube  Surgeon: Michaelle Birks, MD Assistant: Armandina Gemma, MD  EBL: Minimal  Anesthesia: General  Specimens: None  Indications: [name] [name] is an 87 yo female with pulmonary fibrosis on immunosuppressants who presented to the ED a few days ago with abdominal pain, nausea and vomiting. She was noted to have a large stool burden in the colon, however symptoms persisted after fecal disimpaction. Repeat CT scan showed gastric distension with progressive small bowel dilation, with a transition in the pelvis. Multiple attempts at NG placement were unsuccessful, and she has had worsening abdominal pain and tenderness. Given her persistent obstruction and worsening abdominal exam, abdominal exploration was recommended after an extensive discussion with the patient and her family. They have agreed to proceed with surgery.  Findings: Torsion of small bowel mesentery with dusky but viable associated small bowel. Transition point in the distal ileum secondary to an inspissated mass of enteric contents, which was successfully milked into the colon. Small bowel diverticulosis. Stamm gastrostomy placed for gastric decompression.  Procedure details: Informed consent was obtained in the preoperative area prior to the procedure. The patient was brought to the operating room and placed on the table in the supine position. General anesthesia was induced and appropriate lines and drains were placed for intraoperative monitoring. NG placement was attempted with the patient under general anesthesia but the tube would not pass beyond the nose, so an OG tube was placed. Perioperative antibiotics were administered per SCIP guidelines. The abdomen was prepped and draped in the usual sterile fashion. A  pre-procedure timeout was taken verifying patient identity, surgical site and procedure to be performed.  A midline skin incision was made and the subcutaneous tissue was divided with cautery. The fascia was grasped and elevated, and opened along the linea alba. On entry into the peritoneum there was a small amount of clear straw-colored fluid but no succus or turbid fluid. The small bowel was dilated and dusky in appearance but not frankly ischemic. The small bowel was eviscerated and found to be torsed around the root of the mesentery. The bowel was detorsed and run from the ligament of Treitz to the terminal ileum. The proximal small bowel was dilated, with a transition point at the distal ileum about 30cm proximal to the ileocecal valve, where an soft inspissated intraluminal mass was palpated within the lumen of the bowel. This mass was milked distally beyond the ileocecal valve into the colon. The small bowel was again run and multiple small bowel diverticuli were noted on the mesenteric side with no inflammation or perforation. No adhesive bands or other points of obstruction were identified. Perfusion to the small bowel was much improved after detorsion of the mesentery and the bowel was placed back into the abdomen, taking care not to twist the mesentery. The gallbladder was identified in the RUQ and was normal in appearance without inflammation and no evidence of cholecystoduodenal fistula. No stones were palpated within the gallbladder.  Next a gastrostomy was placed. Two rows of pursestring silk sutures were placed on the body of the stomach near the greater curve. A gastrotomy was made at the center of the pursestrings and suction was used to completely decompress the stomach, which contained bilious fluid and retained food particles. A 24-Fr mic gastrostomy tube was brought onto the field and passed through the left upper  quadrant abdominal wall. The G tube was inserted into the stomach through the  gastrotomy and the pursestrings were tied down. The stomach was then tacked to the abdominal wall around the G tube using 2-0 silk sutures to create a Stamm gastrostomy. The balloon in the G tube was then inflated with 72mL sterile water and the tube was retracted so that the balloon sat flush against the stomach and abdominal wall without being too tight. The G tube was at 3.5cm at the skin. The tube was secured to the skin with 2-0 nylon. The fascia was closed with a running looped 1 PDS and the skin was closed with staples. A sterile dressing was applied.  The patient tolerated the procedure with no apparent complications. All counts were correct x2 at the end of the procedure. The patient was extubated and taken to PACU in stable condition.  Michaelle Birks, MD 08/23/20 11:38 AM

## 2020-08-23 NOTE — Anesthesia Preprocedure Evaluation (Addendum)
Anesthesia Evaluation  Patient identified by MRN, date of birth, ID band Patient awake    Reviewed: Allergy & Precautions, NPO status , Patient's Chart, lab work & pertinent test results  Airway Mallampati: II  TM Distance: >3 FB Neck ROM: Full    Dental  (+) Dental Advisory Given, Edentulous Upper, Edentulous Lower   Pulmonary former smoker, PE Pulmonary fibrosis Chronic hypoxic respiratory failure on 2 L nasal cannula    Pulmonary exam normal breath sounds clear to auscultation       Cardiovascular + CAD, +CHF and + DVT  Normal cardiovascular exam Rhythm:Regular Rate:Normal  Echo 01/26/20: 1. Left ventricular ejection fraction, by estimation, is 55 to 60%. The  left ventricle has normal function. The left ventricle has no regional  wall motion abnormalities. Left ventricular diastolic parameters are  indeterminate. Elevated left atrial  pressure.  2. Right ventricular systolic function is normal. The right ventricular  size is normal. There is normal pulmonary artery systolic pressure.  3. The mitral valve is normal in structure. No evidence of mitral valve  regurgitation.  4. The aortic valve is tricuspid. Aortic valve regurgitation is not  visualized. Mild aortic valve sclerosis is present, with no evidence of  aortic valve stenosis.  5. The inferior vena cava is normal in size with <50% respiratory  variability, suggesting right atrial pressure of 8 mmHg.    Neuro/Psych  Headaches,    GI/Hepatic Neg liver ROS, small bowel obstruction   Endo/Other  negative endocrine ROS  Renal/GU negative Renal ROS     Musculoskeletal  (+) Arthritis , Rheumatoid disorders,    Abdominal   Peds  Hematology  (+) Blood dyscrasia, anemia ,   Anesthesia Other Findings Day of surgery medications reviewed with the patient.  Reproductive/Obstetrics                           Anesthesia  Physical Anesthesia Plan  ASA: IV and emergent  Anesthesia Plan: General   Post-op Pain Management:    Induction: Intravenous, Rapid sequence and Cricoid pressure planned  PONV Risk Score and Plan: 4 or greater and Dexamethasone, Ondansetron and Treatment may vary due to age or medical condition  Airway Management Planned: Oral ETT  Additional Equipment: Arterial line  Intra-op Plan:   Post-operative Plan: Possible Post-op intubation/ventilation  Informed Consent: I have reviewed the patients History and Physical, chart, labs and discussed the procedure including the risks, benefits and alternatives for the proposed anesthesia with the patient or authorized representative who has indicated his/her understanding and acceptance.     Dental advisory given  Plan Discussed with: CRNA  Anesthesia Plan Comments:        Anesthesia Quick Evaluation

## 2020-08-23 NOTE — Anesthesia Procedure Notes (Signed)
Arterial Line Insertion Start/End2/13/2022 10:21 AM, 08/23/2020 10:26 AM Performed by: Catalina Gravel, MD, anesthesiologist  Patient location: OR. Preanesthetic checklist: patient identified, IV checked, site marked, risks and benefits discussed, surgical consent, monitors and equipment checked, pre-op evaluation, timeout performed and anesthesia consent Lidocaine 1% used for infiltration Left, radial was placed Catheter size: 20 Fr Hand hygiene performed  and maximum sterile barriers used   Attempts: 1 Procedure performed without using ultrasound guided technique. Following insertion, dressing applied and Biopatch. Post procedure assessment: normal and unchanged  Patient tolerated the procedure well with no immediate complications.

## 2020-08-23 NOTE — Transfer of Care (Signed)
Immediate Anesthesia Transfer of Care Note  Patient: [name] [name]  Procedure(s) Performed: EXPLORATORY LAPAROTOMY small bowel resection gastrostomy tube placement (N/A )  Patient Location: PACU  Anesthesia Type:General  Level of Consciousness: awake and patient cooperative  Airway & Oxygen Therapy: Patient Spontanous Breathing and Patient connected to face mask oxygen  Post-op Assessment: Report given to RN, Post -op Vital signs reviewed and stable and Patient moving all extremities X 4  Post vital signs: stable  Last Vitals:  Vitals Value Taken Time  BP 148/84 08/23/20 1150  Temp 36.4 C 08/23/20 1140  Pulse 104 08/23/20 1159  Resp 20 08/23/20 1159  SpO2 97 % 08/23/20 1159  Vitals shown include unvalidated device data.  Last Pain:  Vitals:   08/23/20 0830  TempSrc:   PainSc: 0-No pain      Patients Stated Pain Goal: 0 (05/11/27 1188)  Complications: No complications documented.

## 2020-08-23 NOTE — Progress Notes (Signed)
CRITICAL VALUE ALERT  Critical Value:  MRSA PCR Positive   Date & Time Notied:  08/23/20 1400  Provider Notified: Christinia Gully, MD   Orders Received/Actions taken: Standing positive orders added to chart

## 2020-08-23 NOTE — Progress Notes (Signed)
PHARMACY - TOTAL PARENTERAL NUTRITION CONSULT NOTE   Indication: Small bowel obstruction  Patient Measurements: Height: 5' 4.5" (163.8 cm) Weight: 44.5 kg (98 lb) IBW/kg (Calculated) : 55.85 TPN AdjBW (KG): 44.5 Body mass index is 16.56 kg/m.  Assessment: 87 yo female with persistent SBO that may be result of gallstone ileus. Per GI, NGT may allow her obstruction to open but she has refused this and will likely need surgery otherwise  Glucose / Insulin: am glucose 216, note patient on chronic steroids currently on solucortef 25mg  q12 Electrolytes: WNL except phos still 1.7 despite 15 mMol of Kphos 2/12, mag 1.8 and K 3.5 Renal: Scr 0.71 stable LFTs / TGs: LFTs WNL, TG 88 Prealbumin / albumin: prealb 7.1, alb 2.8 Intake / Output; MIVF: I/O not documented, NS at [facility] GI Imaging: SBO per CT Surgeries / Procedures: none yet but may be needed in coming days  Central access: 2/12 - PICC ordered 2/11 TPN start date: 2/12  Nutritional Goals (per RD recommendation on 2/11): kCal: 1500-1700, Protein: 70-85, Fluid: >= 1.4L  Note that currently there is a Lipid emulsion shortage so SMOFlipid will only be added on MWF for appropriate patients  Goal TPN rate MWF with lipid is 60 mL/hr (provides 72 g of protein and 1454 kcals per day)  Goal TPN rate TuThSatSun without lipid is 60 ml/hr (provides 72g protein and 1267 kcal)   Current Nutrition:  NPO  Plan:  Now: Kphos 20 mMol  IV x 1, Mag Sulfate 2g IV x 1  Increase TPN rate from 65mL/hr to 60 ml/hr at 1800 Electrolytes in TPN: 7mEq/L of Na, 77mEq/L of K, 69mEq/L of Ca, 25mEq/L of Mg, and 37mmol/L of Phos. Cl:Ac ratio 1:1 Add standard MVI and trace elements to TPN Initiate Sensitive q6 SSI and adjust as needed  Continue MIVF to [facility] mL/hr at 1800 Monitor TPN labs on Mon/Thurs and as needed  [name] [name] 08/23/2020,8:20 AM

## 2020-08-23 NOTE — Consult Note (Addendum)
NAME:  [name] [name], MRN:  [id number], DOB:  [birth year], LOS: 6 ADMISSION DATE:  08/17/2020, CONSULTATION DATE: 2/13 REFERRING MD:  Allen/CCS, CHIEF COMPLAINT:  resp failure post op    Brief History:  53 yowf with RA/ PF on ofev and prednisone and  2lpm baseline admitted 2/7 with abd pain n and v found to have sbo > lap 2/13 c/w Torsion of small bowel mesentery with dusky but viable associated small bowel so no untwisted and obst relieved and transferred to ICU post op where PCCM service consulted pm 2/13    History of Present Illness:  87 y.o. female with medical history significant of chronic hypoxic respiratory failure on 2L [place], ILD, rheum arthritis. Presenting with N/V ab pain. Reports that it started 3 days PTA, described as generalized, comes in waves, unable to eat x last 2 days PTA  d/t N/V with no BM in over a week.   Eval by gen surgery and went to OR am 2/13 for SBO / torsion revieved and transferred to ICU for post op care p successful extubation in PACU and PCCM asked to eval pm 2/13    Past Medical History:  Allergic rhinitis CAD CHF DJD DVT Dyslipidemia History of pulmonary embolism Hyperlipidemia  Osteoporosis  Psoriasis Pulmonary fibrosis dx 2003/ followed by Mannam on OFEV low dose Rheumatoid arthritis ([place]).   Significant Hospital Events:  2/13:  Exploratory laparotomy, detorsion of small bowel, placement of Stamm gastrostomy tube  Consults:  GI surgery GI medicine   Procedures:  Exploratory laparotomy, detorsion of small bowel, placement of Stamm gastrostomy tube   Significant Diagnostic Tests:     Micro Data:  Resp viral PCR  2/7  Neg  UC   2/11 >>>   Scheduled Meds: . bisacodyl  10 mg Rectal Daily  . chlorhexidine  15 mL Mouth Rinse BID  . Chlorhexidine Gluconate Cloth  6 each Topical Daily  . enoxaparin (LOVENOX) injection  30 mg Subcutaneous Q24H  . fentaNYL      . hydrocortisone sod succinate (SOLU-CORTEF) inj  25 mg Intravenous Q12H  .  insulin aspart  0-9 Units Subcutaneous Q6H  . mouth rinse  15 mL Mouth Rinse q12n4p  . mupirocin ointment  1 application Nasal BID  . sodium chloride flush  10-40 mL Intracatheter Q12H   Continuous Infusions: . sodium chloride 10 mL/hr at 08/23/20 1601  . cefTRIAXone (ROCEPHIN)  IV 0 mL/hr at 08/22/20 1036  . famotidine (PEPCID) IV 100 mL/hr at 08/23/20 1601  . potassium PHOSPHATE IVPB (in mmol) 85 mL/hr at 08/23/20 1423  . sodium chloride    . TPN ADULT (ION) 40 mL/hr at 08/23/20 0723  . TPN ADULT (ION)     PRN Meds:.fluticasone, hydrOXYzine, morphine injection, Muscle Rub, ondansetron **OR** ondansetron (ZOFRAN) IV, phenol, promethazine, sodium chloride flush    Antimicrobials:  Rocephin 2/8 >>> Fagyl  2/13   Interim History / Subjective:  Says feeling much better, able to pass gas, no sob or cough or cp  Objective   Blood pressure 121/82, pulse 94, temperature 97.6 F (36.4 C), resp. rate 18, height 5' 4.5" (1.638 m), weight 44.5 kg, SpO2 96 %.        Intake/Output Summary (Last 24 hours) at 08/23/2020 1634 Last data filed at 08/23/2020 1601 Gross per 24 hour  Intake 3215.9 ml  Output 875 ml  Net 2340.9 ml   Filed Weights   08/17/20 0326  Weight: 44.5 kg    Examination: General: elderly wf  waves "hi" on entering room  Tmax  98.1 HENT: orophx clear Lungs: distant crackles in bases  Cardiovascular: RRR no s3  Abdomen:  mod distended / diffusely mildly tender Extremities: no edema/ pas in place Neuro: alert/ approp no def      Resolved Hospital Problem list      Assessment & Plan:  1) Acute on chronic 02 dep resp failure secondary to PF assoc with longstanding RA on OFEV PTA now on hold periop >>> rx 02 and stress steroids all that's needed for now   2) s/p lap for SBO that did not required mucosal disruption  >> abx not needed from pulmonary/ ccm perspective/ rx per CSS  3) Hypotension post op likely related to sedation / mild 3rd spacing >>> stress  steroids = 100mg  IV q 12 h  4) Mild hypernatremia c/w mild dehydration.   >>> vol exp with maint hypotonic fluids = 1/2 NS @ 75 cc/h  Best practice (evaluated daily)  Diet: npo  Pain/Anxiety/Delirium protocol (if indicated): per surgery post op VAP protocol (if indicated):  DVT prophylaxis:  PAS  GI prophylaxis: PPI  Glucose control: SSI prn  Mobility: up as tolerated  Disposition: ICU  Code Status: Full code   Labs   CBC: Recent Labs  Lab 08/17/20 0410 08/17/20 2025 08/19/20 0614 08/20/20 0617 08/21/20 3614 08/22/20 0634 08/23/20 0613 08/23/20 1059  WBC 12.8*   < > 12.6* 13.4* 14.9* 16.6* 14.3*  --   NEUTROABS 8.9*  --   --   --   --  12.5*  --   --   HGB 11.5*   < > 11.6* 12.9 12.1 12.2 11.5* 11.2*  HCT 36.6   < > 37.8 42.6 39.4 40.4 36.8 33.0*  MCV 98.7   < > 100.8* 101.2* 99.2 101.0* 99.2  --   PLT 291   < > 292 310 303 262 201  --    < > = values in this interval not displayed.    Basic Metabolic Panel: Recent Labs  Lab 08/19/20 0614 08/20/20 0617 08/21/20 0613 08/22/20 0634 08/23/20 0613 08/23/20 1059  NA 141 142 143 146* 145 147*  K 4.1 3.9 3.7 3.8 3.5 3.5  CL 97* 98 103 104 109  --   CO2 29 27 25 27 26   --   GLUCOSE 108* 94 82 80 216*  --   BUN 20 31* 25* 24* 23  --   CREATININE 0.84 0.91 0.76 0.80 0.71  --   CALCIUM 9.4 9.6 9.2 9.5 9.0  --   MG  --   --   --  1.9 1.8  --   PHOS  --   --   --  1.7* 1.7*  --    GFR: Estimated Creatinine Clearance: 34.8 mL/min (by C-G formula based on SCr of 0.71 mg/dL). Recent Labs  Lab 08/20/20 0617 08/21/20 0613 08/22/20 0634 08/23/20 0613  WBC 13.4* 14.9* 16.6* 14.3*    Liver Function Tests: Recent Labs  Lab 08/17/20 0410 08/18/20 0527 08/21/20 0613 08/22/20 0634 08/23/20 0613  AST 25 20 17 17 20   ALT 17 15 12 12 11   ALKPHOS 78 75 62 66 55  BILITOT 1.0 1.0 1.3* 1.0 0.7  PROT 7.1 6.9 7.0 6.8 5.9*  ALBUMIN 3.4* 3.4* 3.2* 3.3* 2.8*   Recent Labs  Lab 08/17/20 0410  LIPASE 27   No results  for input(s): AMMONIA in the last 168 hours.  ABG    Component Value Date/Time  PHART 7.361 08/23/2020 1059   PCO2ART 50.8 (H) 08/23/2020 1059   PO2ART 341 (H) 08/23/2020 1059   HCO3 28.7 (H) 08/23/2020 1059   TCO2 30 08/23/2020 1059   ACIDBASEDEF 2.0 12/19/2006 1241   O2SAT 100.0 08/23/2020 1059     Coagulation Profile: No results for input(s): INR, PROTIME in the last 168 hours.  Cardiac Enzymes: No results for input(s): CKTOTAL, CKMB, CKMBINDEX, TROPONINI in the last 168 hours.  HbA1C: Hgb A1c MFr Bld  Date/Time Value Ref Range Status  01/25/2020 09:42 AM 4.2 (L) 4.8 - 5.6 % Final    Comment:    (NOTE) Pre diabetes:          5.7%-6.4%  Diabetes:              >6.4%  Glycemic control for   <7.0% adults with diabetes     CBG: Recent Labs  Lab 08/23/20 1216  GLUCAP 186*         Surgical History:   Past Surgical History:  Procedure Laterality Date  . ABDOMINAL HYSTERECTOMY  1974  . APPENDECTOMY  1959  . CATARACT EXTRACTION    . LEFT HEART CATHETERIZATION WITH CORONARY ANGIOGRAM N/A 06/18/2013   Procedure: LEFT HEART CATHETERIZATION WITH CORONARY ANGIOGRAM;  Surgeon: Peter M Martinique, MD;  Location[facility] C[facility] LAB;  Service: Cardiovascular;  Laterality: N/A;  . TONSILLECTOMY  1941, 1951  . TOTAL KNEE ARTHROPLASTY  1997, 2007   Bilateral     Social History:   reports that she quit smoking about 47 years ago. Her smoking use included cigarettes. She has a 10.00 pack-year smoking history. She has never used smokeless tobacco. She reports that she does not drink alcohol and does not use drugs.   Family History:  Her family history includes Alzheimer's disease in her sister and sister; Cancer in her daughter; Heart attack (age of onset: 50) in her father; Heart disease in her sister; Kidney disease (age of onset: 67) in her daughter.   Allergies Allergies  Allergen Reactions  . Crestor [Rosuvastatin] Other (See Comments)    Muscle pain  . Lactose Intolerance  (Gi) Other (See Comments)    Stomach upset  . Penicillins Hives  . Imuran [Azathioprine] Nausea And Vomiting     Home Medications  Prior to Admission medications   Medication Sig Start Date End Date Taking? Authorizing Provider  atorvastatin (LIPITOR) 40 MG tablet Take 1 tablet (40 mg total) by mouth daily. 04/28/20  Yes Burnell Blanks, MD  dapsone 100 MG tablet TAKE 1 TABLET(100 MG) BY MOUTH DAILY Patient taking differently: Take 100 mg by mouth daily. 12/03/19  Yes Mannam, Praveen, MD  furosemide (LASIX) 20 MG tablet Take 1 tablet by mouth daily as needed for swelling. Patient taking differently: Take 20 mg by mouth daily as needed for fluid or edema. 12/11/18  Yes Burtis Junes, NP  InFLIXimab (REMICADE IV) Inject into the vein as directed. Every 2 months   Yes [provider]  mycophenolate (CELLCEPT) 500 MG tablet 2 tabs bid Patient taking differently: Take 1,000 mg by mouth 2 (two) times daily. 01/27/20  Yes Charlynne Cousins, MD  nitroGLYCERIN (NITROSTAT) 0.4 MG SL tablet Place 1 tablet (0.4 mg total) under the tongue every 5 (five) minutes as needed for chest pain. 06/29/20 09/27/20 Yes Burnell Blanks, MD  predniSONE (DELTASONE) 5 MG tablet TAKE 1 TABLET DAILY WITH BREAKFAST Patient taking differently: Take 5 mg by mouth daily with breakfast. 05/08/20  Yes Marshell Garfinkel, MD  aspirin EC 81 MG tablet Take 1 tablet (81 mg total) by mouth daily. Patient not taking: Reported on 08/17/2020 11/30/15   Richardson Dopp T, PA-C  fluticasone [facility]) 50 MCG/ACT nasal spray Place 2 sprays into both nostrils daily as needed for allergies or rhinitis. 03/17/15   Hoyt Koch, MD  metoprolol succinate (TOPROL XL) 25 MG 24 hr tablet Take 1 tablet (25 mg total) by mouth daily. Patient not taking: Reported on 08/17/2020 12/12/18   Richardson Dopp T, PA-C  mirtazapine (REMERON) 15 MG tablet Take 1 tablet (15 mg total) by mouth at bedtime. Patient not taking: Reported on  08/17/2020 07/18/19   Hoyt Koch, MD  Nintedanib (OFEV) 100 MG CAPS Take 1 capsule (100 mg total) by mouth 2 (two) times daily. 08/18/20   Mannam, Hart Robinsons, MD  nystatin-triamcinolone (MYCOLOG II) cream Apply 1 application topically 2 (two) times daily. Patient not taking: Reported on 08/17/2020 04/13/20   Marrian Salvage, [place]      The patient is critically ill with multiple organ systems failure and requires high complexity decision making for assessment and support, frequent evaluation and titration of therapies, application of advanced monitoring technologies and extensive interpretation of multiple databases. Critical Care Time devoted to patient care services described in this note is 45 minutes.    Christinia Gully, MD Pulmonary and [place] [phone]   After 7:00 pm call Elink  [phone]

## 2020-08-23 NOTE — Anesthesia Procedure Notes (Signed)
Procedure Name: Intubation Date/Time: 08/23/2020 10:21 AM Performed by: Lissa Morales, CRNA Pre-anesthesia Checklist: Patient identified, Emergency Drugs available, Suction available and Patient being monitored Patient Re-evaluated:Patient Re-evaluated prior to induction Oxygen Delivery Method: Circle system utilized Preoxygenation: Pre-oxygenation with 100% oxygen Induction Type: IV induction, Rapid sequence and Cricoid Pressure applied Laryngoscope Size: Mac and 4 Grade View: Grade I Tube type: Oral Tube size: 7.0 mm Number of attempts: 1 Airway Equipment and Method: Stylet and Oral airway Placement Confirmation: ETT inserted through vocal cords under direct vision,  positive ETCO2 and breath sounds checked- equal and bilateral Secured at: 21 cm Tube secured with: Tape Dental Injury: Teeth and Oropharynx as per pre-operative assessment  Comments: Cords clear  easy intubation

## 2020-08-23 NOTE — Anesthesia Postprocedure Evaluation (Signed)
Anesthesia Post Note  Patient: [name] [name]  Procedure(s) Performed: EXPLORATORY LAPAROTOMY small bowel resection gastrostomy tube placement (N/A )     Patient location during evaluation: PACU Anesthesia Type: General Level of consciousness: awake and alert Pain management: pain level controlled Vital Signs Assessment: post-procedure vital signs reviewed and stable Respiratory status: spontaneous breathing, nonlabored ventilation, respiratory function stable and patient connected to nasal cannula oxygen Cardiovascular status: blood pressure returned to baseline and stable Postop Assessment: no apparent nausea or vomiting Anesthetic complications: no   No complications documented.  Last Vitals:  Vitals:   08/23/20 1331 08/23/20 1359  BP:    Pulse: (!) 108 94  Resp: (!) 27 18  Temp:    SpO2: 96%     Last Pain:  Vitals:   08/23/20 0830  TempSrc:   PainSc: 0-No pain                 Catalina Gravel

## 2020-08-23 NOTE — Progress Notes (Signed)
PROGRESS NOTE    [name] [name]  PNT:[id number] DOB: Aug 13, 1930 DOA: 08/17/2020 PCP: Hoyt Koch, MD   Chief Complaint  Patient presents with  . Abdominal Pain  Brief Narrative: 87 year old female with history of chronic hypoxic respiratory failure on 2 L nasal cannula secondary to ILD/pulmonary fibrosis,rheumatoid arthritis on chronic prednisone, CAD/CHF, dyslipidemia, history of DVT, presented with nausea vomiting abdominal pain, onset 3 days prior to admission.Patient seen in the ED 2/7 reported having a bowel movement a week prior to admission in the ED found to have SBO, fecal impaction disimpacted x2 with enema, CCS was consulted for SBO and was admitted for further management. 2/8- started on CLD, but had episode of vomiting on 2/9 - had Xray abd that showed normal bowel gas pattern. She is followed closely by general surgery.  Again had episode of vomiting 2/10 am, surgery planned for enema.Nephew wanted to look into second opinion at [facility]- called  and discussed w/ Transfer center , they are at capacity- was called back by Hospitalist from [facility] and they have declined transfer, nephew made aware of.2/11-GI also consulted, repeat CT abdomen done that showed -progressive small bowel obstruction with significant gastric and small bowel distention, transition point associated with an intraluminal lesion in the mid distal ileum in the mid pelvis, most likely a large gallstone ileus. 2/12: Multiple attempts for NG tube insertion has been failed-by nursing/surgeon as well as IR. 2/13: Patient with ongoing more abdominal pain significant tenderness and some confusion and CCS planning for surgery after discussing with patient and Nephew.  Subjective:  This morning patient is significant abdominal tenderness more abdominal pain mildly confused.  No bowel movement flatus has nausea but no vomiting. After discussion for few more minutes she understands overall situation and that she needs  surgery. If he is not at the bedside but Dr. Zenia Resides has already discussed with patient and nephew about need for surgery this morning.  Assessment & Plan:  SBO: Persistent with more abdominal pain tenderness this morning and mild confusion.  Multiple attempts for NG tube has failed.  As per CCS, at this time no further options besides doing surgery although high risk with her poor nutritional status multiple comorbidities chronic immunosuppression/chronic respiratory failure pulmonary fibrosis history of CAD/CHF.  Chronic hypoxic respiratory failure on 2 L [place]: On baseline 2 L nasal cannula saturating well  ILD/pulmonary fibrosis/Rheumatoid arthritis on chronic prednisone, Ofev, CellCept/Remicade outpatient infusion: Unable to take p.o. switch to iv hydrocortisone for now as npo- pharmacy dosing-discussed. I have communicated to her Pulmonologist Dr Vaughan Browner and okay to hold her ILD meds for now.   Hx of CAD/CHF ( D) /Dyslipidemia: No evidence of fluid overload. On TPN. Normally she takes Lasix 20 mg as needed for fluid or edema. She has not been taking aspirin and metoprolol at home prior to admission.  UTI on admission UA grossly abnormal UA, repeat UA 2/12-grossly abnormal ( wbc>50,rbc>50, cloudy, LE trace)) and urine culture pending.  Has leukocytosis (chronically on the steroid as well) which is downtrending today, remains on ceftriaxone.  She is immunocompromised , so cont antibiotics.    History of DVT not on anticoagulation due to history of falls. On lovenox here.  Normocytic anemia hemoglobin remains stable. 11-12gm  Hypophosphatemia continue to address with OT.  Dehydration/ketosis in the urine, as evidenced by ketones 18, continue TPN, has been on ivf.  Goals of care full code.  Multiple comorbidities, with significant pulmonary disease, chronic respiratory failure now with SBO-debilitated.palliative care consultation  has been requested to follow along.  Nutrition Status: Severe  protein calorie malnourishment present.  Has been on TPN 2/12.  Pharmacy dietitian following. Nutrition Problem: Severe Malnutrition Etiology: chronic illness (ILD/pulmonary fibrosis) Signs/Symptoms: severe fat depletion,severe muscle depletion Interventions: MVI,Boost Plus,Refer to RD note for recommendations  Diet Order            Diet NPO time specified  Diet effective now                DVT prophylaxis: SCD's Start: 08/23/20 0842 enoxaparin (LOVENOX) injection 30 mg Start: 08/17/20 2200 Code Status:   Code Status: Full Code  Family Communication: plan of care discussed with patient at bedside.   I have had extensive discussion with patient's nephew today, he is not at bedside today.  Dr Zenia Resides has spoken to him this am.  Status is: Inpatient Remains inpatient appropriate because:IV treatments appropriate due to intensity of illness or inability to take PO and Inpatient level of care appropriate due to severity of illness  Dispo: The patient is from: Home              Anticipated d/c is to: tbd               Anticipated d/c date is: >3 days              Patient currently is not medically stable to d/c.   Difficult to place patient No  Consultants: CCS GI PALLIATIVE MEDICINE   Procedures:see note  Culture/Microbiology Other culture-see note  Medications: Scheduled Meds: . bisacodyl  10 mg Rectal Daily  . Chlorhexidine Gluconate Cloth  6 each Topical Daily  . Chlorhexidine Gluconate Cloth  6 each Topical Once  . enoxaparin (LOVENOX) injection  30 mg Subcutaneous Q24H  . hydrocortisone sod succinate (SOLU-CORTEF) inj  25 mg Intravenous Q12H  . insulin aspart  0-9 Units Subcutaneous Q6H  . sodium chloride flush  10-40 mL Intracatheter Q12H   Continuous Infusions: . sodium chloride 50 mL/hr at 08/23/20 0346  . cefTRIAXone (ROCEPHIN)  IV Stopped (08/22/20 1036)  . famotidine (PEPCID) IV Stopped (08/22/20 1138)  . magnesium sulfate bolus IVPB    . metronidazole    .  potassium PHOSPHATE IVPB (in mmol)    . TPN ADULT (ION) 40 mL/hr at 08/23/20 0346  . TPN ADULT (ION)      Antimicrobials: Anti-infectives (From admission, onward)   Start     Dose/Rate Route Frequency Ordered Stop   08/23/20 0930  ceFAZolin (ANCEF) IVPB 2g/100 mL premix  Status:  Discontinued       "And" Linked Group Details   2 g 200 mL/hr over 30 Minutes Intravenous On call to O.R. 08/23/20 4403 08/23/20 0841   08/23/20 0930  metroNIDAZOLE (FLAGYL) IVPB 500 mg       "And" Linked Group Details   500 mg 100 mL/hr over 60 Minutes Intravenous On call to O.R. 08/23/20 4742 08/24/20 0559   08/23/20 0923  ceFAZolin (ANCEF) 2-4 GM/100ML-% IVPB  Status:  Discontinued       Note to Pharmacy: Enrigue Catena   : cabinet override      08/23/20 0923 08/23/20 0938   08/19/20 1000  dapsone tablet 100 mg  Status:  Discontinued       Note to Pharmacy: TAKE 1 TABLET(100 MG) BY MOUTH DAILY     100 mg Oral Daily 08/18/20 1350 08/22/20 0805   08/18/20 1000  cefTRIAXone (ROCEPHIN) 1 g in sodium chloride 0.9 % 100  mL IVPB        1 g 200 mL/hr over 30 Minutes Intravenous Every 24 hours 08/17/20 1414     08/17/20 0615  cefTRIAXone (ROCEPHIN) 1 g in sodium chloride 0.9 % 100 mL IVPB        1 g 200 mL/hr over 30 Minutes Intravenous  Once 08/17/20 0605 08/17/20 0801     Objective: Vitals: Today's Vitals   08/22/20 1230 08/22/20 1945 08/22/20 2017 08/23/20 0512  BP: (!) 142/90  (!) 148/98 102/76  Pulse: (!) 109  (!) 109 94  Resp: 15  16 14   Temp: 97.6 F (36.4 C)  98.1 F (36.7 C) 98.1 F (36.7 C)  TempSrc: Oral  Oral Oral  SpO2: 90%  99% 93%  Weight:      Height:      PainSc:  0-No pain      Intake/Output Summary (Last 24 hours) at 08/23/2020 0944 Last data filed at 08/23/2020 0346 Gross per 24 hour  Intake 1089.91 ml  Output --  Net 1089.91 ml   Filed Weights   08/17/20 0326  Weight: 44.5 kg   Weight change:   Intake/Output from previous day: 02/12 0701 - 02/13 0700 In: 1089.9  [I.V.:789.9; IV Piggyback:300] Out: -  Intake/Output this shift: No intake/output data recorded. Filed Weights   08/17/20 0326  Weight: 44.5 kg   Examination:  General exam: Cachectic thin frail, alert awake, mildly confused this am  HEENT:Oral mucosa moist, Ear/Nose WNL grossly, dentition normal. Respiratory system: bilaterally diminished with dry crackles in bilateral posterior lungs unchanged Cardiovascular system: S1 & S2 +, No JVD,. Gastrointestinal system: Abdomen firm, appears full/mildly distended with significant tenderness, bowel sounds absent Nervous System:Alert, awake, moving all extremities and grossly nonfocal Extremities: No edema, distal peripheral pulses palpable.  Skin: No rashes,no icterus. MSK: Thin muscle bulk, and power  Data Reviewed: I have personally reviewed following labs and imaging studies CBC: Recent Labs  Lab 08/17/20 0410 08/17/20 2025 08/19/20 0614 08/20/20 0617 08/21/20 0613 08/22/20 0634 08/23/20 0613  WBC 12.8*   < > 12.6* 13.4* 14.9* 16.6* 14.3*  NEUTROABS 8.9*  --   --   --   --  12.5*  --   HGB 11.5*   < > 11.6* 12.9 12.1 12.2 11.5*  HCT 36.6   < > 37.8 42.6 39.4 40.4 36.8  MCV 98.7   < > 100.8* 101.2* 99.2 101.0* 99.2  PLT 291   < > 292 310 303 262 201   < > = values in this interval not displayed.   Basic Metabolic Panel: Recent Labs  Lab 08/19/20 0614 08/20/20 0617 08/21/20 0613 08/22/20 0634 08/23/20 0613  NA 141 142 143 146* 145  K 4.1 3.9 3.7 3.8 3.5  CL 97* 98 103 104 109  CO2 29 27 25 27 26   GLUCOSE 108* 94 82 80 216*  BUN 20 31* 25* 24* 23  CREATININE 0.84 0.91 0.76 0.80 0.71  CALCIUM 9.4 9.6 9.2 9.5 9.0  MG  --   --   --  1.9 1.8  PHOS  --   --   --  1.7* 1.7*   GFR: Estimated Creatinine Clearance: 34.8 mL/min (by C-G formula based on SCr of 0.71 mg/dL). Liver Function Tests: Recent Labs  Lab 08/17/20 0410 08/18/20 0527 08/21/20 2094 08/22/20 0634 08/23/20 0613  AST 25 20 17 17 20   ALT 17 15 12 12  11   ALKPHOS 78 75 62 66 55  BILITOT 1.0 1.0 1.3*  1.0 0.7  PROT 7.1 6.9 7.0 6.8 5.9*  ALBUMIN 3.4* 3.4* 3.2* 3.3* 2.8*   Recent Labs  Lab 08/17/20 0410  LIPASE 27   No results for input(s): AMMONIA in the last 168 hours. Coagulation Profile: No results for input(s): INR, PROTIME in the last 168 hours. Cardiac Enzymes: No results for input(s): CKTOTAL, CKMB, CKMBINDEX, TROPONINI in the last 168 hours. BNP (last 3 results) Recent Labs    06/03/20 1001  PROBNP 100   HbA1C: No results for input(s): HGBA1C in the last 72 hours. CBG: No results for input(s): GLUCAP in the last 168 hours. Lipid Profile: Recent Labs    08/22/20 0634  TRIG 88   Thyroid Function Tests: No results for input(s): TSH, T4TOTAL, FREET4, T3FREE, THYROIDAB in the last 72 hours. Anemia Panel: No results for input(s): VITAMINB12, FOLATE, FERRITIN, TIBC, IRON, RETICCTPCT in the last 72 hours. Sepsis Labs: No results for input(s): PROCALCITON, LATICACIDVEN in the last 168 hours.  Recent Results (from the past 240 hour(s))  SARS CORONAVIRUS 2 (TAT 6-24 HRS) Nasopharyngeal Nasopharyngeal Swab     Status: None   Collection Time: 08/17/20 12:28 PM   Specimen: Nasopharyngeal Swab  Result Value Ref Range Status   SARS Coronavirus 2 NEGATIVE NEGATIVE Final    Comment: (NOTE) SARS-CoV-2 target nucleic acids are NOT DETECTED.  The SARS-CoV-2 RNA is generally detectable in upper and lower respiratory specimens during the acute phase of infection. Negative results do not preclude SARS-CoV-2 infection, do not rule out co-infections with other pathogens, and should not be used as the sole basis for treatment or other patient management decisions. Negative results must be combined with clinical observations, patient history, and epidemiological information. The expected result is Negative.  Fact Sheet for Patients: SugarRoll.be  Fact Sheet for Healthcare  Providers: https://www.woods-mathews.com/  This test is not yet approved or cleared by the [place] FDA and  has been authorized for detection and/or diagnosis of SARS-CoV-2 by FDA under an Emergency Use Authorization (EUA). This EUA will remain  in effect (meaning this test can be used) for the duration of the COVID-19 declaration under Se ction 564(b)(1) of the Act, 21 U.S.C. section 360bbb-3(b)(1), unless the authorization is terminated or revoked sooner.  Performed at [facility], [place] [address], [place], [place] [address]      Radiology Studies: CT ABDOMEN PELVIS W CONTRAST  Result Date: 08/21/2020 CLINICAL DATA:  Follow-up small bowel obstruction. EXAM: CT ABDOMEN AND PELVIS WITH CONTRAST TECHNIQUE: Multidetector CT imaging of the abdomen and pelvis was performed using the standard protocol following bolus administration of intravenous contrast. CONTRAST:  64mL OMNIPAQUE IOHEXOL 300 MG/ML  SOLN COMPARISON:  08/17/2020 CT scan. FINDINGS: Lower chest: Severe chronic pulmonary fibrosis again demonstrated. The heart is within normal limits in size. Stable aortic and coronary artery calcifications. Hepatobiliary: No hepatic lesions are identified. Persistent central intrahepatic biliary dilatation significant common bile duct dilatation measuring up to 15 mm. Gallbladder is mildly distended. No obvious gallstones. Pancreas: Moderate pancreatic atrophy but no mass, inflammation or ductal dilatation. Spleen: Normal size.  No focal lesions. Adrenals/Urinary Tract: Adrenal glands and kidneys are unremarkable. The bladder is unremarkable. Stomach/Bowel: Progressive small-bowel obstruction with marked distention of the stomach duodenum and entire small bowel down into the pelvis. There appears to be a transition point associated with an intraluminal lesion. This lesion contains a small amount of gas centrally and is most likely a large gallstone. I believe was present on the  prior study also beyond  this lesions the distal ileum is decompressed/normal in caliber all the way to the terminal ileum. The colon is decompressed. Vascular/Lymphatic: Stable advanced atherosclerotic calcifications involving the aorta and branch vessels but no aneurysm or dissection. No mesenteric or retroperitoneal mass or adenopathy. Reproductive: Surgically absent. Other: No free air or significant free fluid collections. Musculoskeletal: No significant bony findings. Stable degenerative changes involving the lumbar spine. IMPRESSION: 1. Progressive small-bowel obstruction with significant gastric and small bowel distension. There is a transition point associated with an intraluminal lesion in the mid distal ileum in the mid pelvis. This is most likely a large gallstone (gallstone ileus). 2. Persistent intra and extrahepatic biliary dilatation without obvious cause. A distal stricture is possible. 3. Stable advanced atherosclerotic calcifications involving the aorta and branch vessels. 4. Severe chronic pulmonary fibrosis. 5. Aortic atherosclerosis. Aortic Atherosclerosis (ICD10-I70.0). Electronically Signed   By: Marijo Sanes M.D.   On: 08/21/2020 20:00   DG [place] Dys Tube Plc W/Fl W/Rad  Result Date: 08/22/2020 CLINICAL DATA:  Inpatient. Small bowel obstruction. Nasogastric feeding tube placement requested under fluoroscopic guidance. EXAM: NASO G TUBE PLACEMENT WITH FL AND WITH RAD CONTRAST:  None. FLUOROSCOPY TIME:  Fluoroscopy Time:  1 minutes 6 seconds Radiation Exposure Index (if provided by the fluoroscopic device): 3.8 mGy Number of Acquired Spot Images: 0 COMPARISON:  08/21/2020 CT abdomen/pelvis. 01/24/2020 chest CT angiogram. FINDINGS: Nasogastric tube placement was attempted under fluoroscopic guidance, however was unsuccessful due to inability to pass the tube beyond the posterior nasopharynx. IMPRESSION: Technically unsuccessful attempted nasogastric tube placement under fluoroscopic guidance  due to obstruction at the level of the posterior nasopharynx. Electronically Signed   By: Ilona Sorrel M.D.   On: 08/22/2020 14:25   DG ATTEMPTED STUDY-NO REPORT  Result Date: 08/22/2020 There is no Radiologist interpretation  for this exam.  [place] EKG SITE RITE  Result Date: 08/21/2020 If Site Rite image not attached, placement could not be confirmed due to current cardiac rhythm.    LOS: 6 days   Antonieta Pert, MD [facility]  08/23/2020, 9:44 AM

## 2020-08-24 ENCOUNTER — Encounter (HOSPITAL_COMMUNITY): Payer: Self-pay | Admitting: Surgery

## 2020-08-24 ENCOUNTER — Inpatient Hospital Stay (HOSPITAL_COMMUNITY): Payer: Medicare Other

## 2020-08-24 DIAGNOSIS — R8271 Bacteriuria: Secondary | ICD-10-CM

## 2020-08-24 DIAGNOSIS — K56609 Unspecified intestinal obstruction, unspecified as to partial versus complete obstruction: Secondary | ICD-10-CM

## 2020-08-24 DIAGNOSIS — M069 Rheumatoid arthritis, unspecified: Secondary | ICD-10-CM | POA: Diagnosis not present

## 2020-08-24 DIAGNOSIS — J9621 Acute and chronic respiratory failure with hypoxia: Secondary | ICD-10-CM | POA: Diagnosis not present

## 2020-08-24 DIAGNOSIS — J849 Interstitial pulmonary disease, unspecified: Secondary | ICD-10-CM | POA: Diagnosis not present

## 2020-08-24 LAB — DIFFERENTIAL
Abs Immature Granulocytes: 0.17 10*3/uL — ABNORMAL HIGH (ref 0.00–0.07)
Basophils Absolute: 0.1 10*3/uL (ref 0.0–0.1)
Basophils Relative: 0 %
Eosinophils Absolute: 0 10*3/uL (ref 0.0–0.5)
Eosinophils Relative: 0 %
Immature Granulocytes: 1 %
Lymphocytes Relative: 10 %
Lymphs Abs: 1.5 10*3/uL (ref 0.7–4.0)
Monocytes Absolute: 0.8 10*3/uL (ref 0.1–1.0)
Monocytes Relative: 6 %
Neutro Abs: 11.8 10*3/uL — ABNORMAL HIGH (ref 1.7–7.7)
Neutrophils Relative %: 83 %

## 2020-08-24 LAB — COMPREHENSIVE METABOLIC PANEL
ALT: 10 U/L (ref 0–44)
AST: 18 U/L (ref 15–41)
Albumin: 2.4 g/dL — ABNORMAL LOW (ref 3.5–5.0)
Alkaline Phosphatase: 44 U/L (ref 38–126)
Anion gap: 8 (ref 5–15)
BUN: 16 mg/dL (ref 8–23)
CO2: 25 mmol/L (ref 22–32)
Calcium: 7.8 mg/dL — ABNORMAL LOW (ref 8.9–10.3)
Chloride: 108 mmol/L (ref 98–111)
Creatinine, Ser: 0.46 mg/dL (ref 0.44–1.00)
GFR, Estimated: >60 mL/min (ref >60)
Glucose, Bld: 187 mg/dL — ABNORMAL HIGH (ref 70–99)
Potassium: 4.1 mmol/L (ref 3.5–5.1)
Sodium: 141 mmol/L (ref 135–145)
Total Bilirubin: 0.6 mg/dL (ref 0.3–1.2)
Total Protein: 5.1 g/dL — ABNORMAL LOW (ref 6.5–8.1)

## 2020-08-24 LAB — TRIGLYCERIDES: Triglycerides: 39 mg/dL (ref <150)

## 2020-08-24 LAB — GLUCOSE, CAPILLARY
Glucose-Capillary: 168 mg/dL — ABNORMAL HIGH (ref 70–99)
Glucose-Capillary: 184 mg/dL — ABNORMAL HIGH (ref 70–99)
Glucose-Capillary: 181 mg/dL — ABNORMAL HIGH (ref 70–99)
Glucose-Capillary: 172 mg/dL — ABNORMAL HIGH (ref 70–99)

## 2020-08-24 LAB — CBC
HCT: 34.5 % — ABNORMAL LOW (ref 36.0–46.0)
Hemoglobin: 10.3 g/dL — ABNORMAL LOW (ref 12.0–15.0)
MCH: 31.3 pg (ref 26.0–34.0)
MCHC: 29.9 g/dL — ABNORMAL LOW (ref 30.0–36.0)
MCV: 104.9 fL — ABNORMAL HIGH (ref 80.0–100.0)
Platelets: 157 10*3/uL (ref 150–400)
RBC: 3.29 MIL/uL — ABNORMAL LOW (ref 3.87–5.11)
RDW: 14.4 % (ref 11.5–15.5)
WBC: 14.3 10*3/uL — ABNORMAL HIGH (ref 4.0–10.5)
nRBC: 0 % (ref 0.0–0.2)

## 2020-08-24 LAB — PHOSPHORUS: Phosphorus: 2.3 mg/dL — ABNORMAL LOW (ref 2.5–4.6)

## 2020-08-24 LAB — MAGNESIUM: Magnesium: 1.9 mg/dL (ref 1.7–2.4)

## 2020-08-24 LAB — PREALBUMIN: Prealbumin: 6 mg/dL — ABNORMAL LOW (ref 18–38)

## 2020-08-24 MED ORDER — ACETAMINOPHEN 10 MG/ML IV SOLN
1000.0000 mg | Freq: Four times a day (QID) | INTRAVENOUS | Status: AC
  Administered 2020-08-24 – 2020-08-25 (×4): 1000 mg via INTRAVENOUS
  Filled 2020-08-24 (×4): qty 100

## 2020-08-24 MED ORDER — TRAVASOL 10 % IV SOLN
INTRAVENOUS | Status: AC
  Filled 2020-08-24: qty 720

## 2020-08-24 MED ORDER — METHOCARBAMOL 1000 MG/10ML IJ SOLN
500.0000 mg | Freq: Three times a day (TID) | INTRAVENOUS | Status: DC
  Administered 2020-08-24 – 2020-08-31 (×22): 500 mg via INTRAVENOUS
  Filled 2020-08-24 (×22): qty 500

## 2020-08-24 MED ORDER — POTASSIUM PHOSPHATES 15 MMOLE/5ML IV SOLN
15.0000 mmol | Freq: Once | INTRAVENOUS | Status: AC
  Administered 2020-08-24: 15 mmol via INTRAVENOUS
  Filled 2020-08-24: qty 5

## 2020-08-24 NOTE — Consult Note (Signed)
[facility] for Infectious Disease    Date of Admission:  08/17/2020     Reason for Consult:  Positive urine cultures     Referring Physician: Dr Antonieta Pert  Current antibiotics: Ceftriaxone 2/6- present    ASSESSMENT:    1. PsA and E faecalis bacteruria without evidence of UTI 2. SBO s/p ex lap, detorsion of small bowel and placement of G-tube 08/23/20 3. ILD, RA on IS - currently receiving stress dose steroids 4. Leukocytosis  PLAN:    . Unclear if true UTI given lack of urinary symptoms, afebrile.  Leukocytosis probably multifactorial in setting of stress steroids and SBO.  Would recommend stopping antibiotics since do not suspect UTI and no concern for intra-abdominal infection related to SBO and surgery . Will not continue to follow, please call as needed.   HPI:    [name] [name] is a 87 y.o. female with hx of ILD/pulmonary fibrosis, RA, on immunosuppression, CAD, CHF, HLD, hx DVT who presented 08/17/20 with n/v, abdominal pain for about 3 days prior to [place].  In the ED, found to have SBO.  Managed conservatively initially.  On 2/12 there were multiple attempts for NG tube insertion that was unsuccessful.  On 2/13 she had ongoing more abdominal pain, significant tenderness, and some new confusion.  She was taken to the OR on 2/13 for exploratory laparotomy, detorsion of small bowel, and placement of G tube.  She has been on CTX since admission due to concern for UTI.  Initial UA on 2/7 showed moderate leukocytes, positive nitrites, many bacteria.  Follow up UA 2/11 with trace leukocytes, negative nitrites, many bacteria.  Urine culture from 2/11 with PsA and E faecalis.  She has a Foley in place since her surgery.  She has had an elevated/stable leukocytosis since admission.  Tmax over the past several days = 99.4.   Past Medical History:  Diagnosis Date  . Allergic rhinitis   . Allergy   . CAD (coronary artery disease)    Mild CAD by cath 2008  . CHF (congestive heart  failure) ([place])   . DJD (degenerative joint disease)    rheumatoid  . DVT (deep venous thrombosis) ([address])   . Dyslipidemia   . History of echocardiogram    Echo 12/2018: EF 60-65, mod asymmetric LVH, Gr 1 DD  . History of nuclear stress test    Myoview 5/17:  EF 74%, normal perfusion, low risk // Myoview 12/2018:  EF 89, very mild ischemia in inferoapical wall; Low Risk  . History of pulmonary embolism   . Hyperlipidemia   . Insomnia   . Osteoporosis   . Psoriasis   . Pulmonary fibrosis ([place])   . Rheumatoid arthritis ([place])     Social History   Tobacco Use  . Smoking status: Former Smoker    Packs/day: 0.50    Years: 20.00    Pack years: 10.00    Types: Cigarettes    Quit date: 07/11/1973    Years since quitting: 47.1  . Smokeless tobacco: Never Used  Vaping Use  . Vaping Use: Never used  Substance Use Topics  . Alcohol use: No  . Drug use: No    Family History  Problem Relation Age of Onset  . Kidney disease Daughter 5  . Cancer Daughter   . Heart attack Father 90  . Alzheimer's disease Sister   . Heart disease Sister   . Alzheimer's disease Sister     Allergies  Allergen Reactions  .  Crestor [Rosuvastatin] Other (See Comments)    Muscle pain  . Lactose Intolerance (Gi) Other (See Comments)    Stomach upset  . Penicillins Hives  . Imuran [Azathioprine] Nausea And Vomiting    Review of Systems  Constitutional: Negative for chills and fever.  Respiratory: Negative.   Cardiovascular: Negative.   Gastrointestinal: Positive for abdominal pain. Negative for nausea and vomiting.  Genitourinary: Negative for dysuria and flank pain.  All other systems reviewed and are negative.   OBJECTIVE:   Blood pressure (!) 102/48, pulse 80, temperature (!) 97.5 F (36.4 C), temperature source Axillary, resp. rate 20, height 5' 4.5" (1.638 m), weight 44.5 kg, SpO2 94 %. Body mass index is 16.56 kg/m.  Physical Exam Constitutional:      General: She is not in acute  distress.    Appearance: She is well-developed.  HENT:     Head: Normocephalic and atraumatic.  Eyes:     Extraocular Movements: Extraocular movements intact.  Cardiovascular:     Rate and Rhythm: Normal rate and regular rhythm.  Pulmonary:     Effort: Pulmonary effort is normal.     Breath sounds: Normal breath sounds.  Abdominal:     Comments: Abdominal binder in place. G-tube in place. Midline incision.  No significant tenderness.   Skin:    General: Skin is warm and dry.     Coloration: Skin is not jaundiced.  Neurological:     General: No focal deficit present.     Mental Status: She is alert.  Psychiatric:        Mood and Affect: Mood normal.        Behavior: Behavior normal.     Lab Results: Lab Results  Component Value Date   WBC 14.3 (H) 08/24/2020   HGB 10.3 (L) 08/24/2020   HCT 34.5 (L) 08/24/2020   MCV 104.9 (H) 08/24/2020   PLT 157 08/24/2020    Lab Results  Component Value Date   NA 141 08/24/2020   K 4.1 08/24/2020   CO2 25 08/24/2020   GLUCOSE 187 (H) 08/24/2020   BUN 16 08/24/2020   CREATININE 0.46 08/24/2020   CALCIUM 7.8 (L) 08/24/2020   GFRNONAA >60 08/24/2020   GFRAA 69 04/29/2020    Lab Results  Component Value Date   ALT 10 08/24/2020   AST 18 08/24/2020   ALKPHOS 44 08/24/2020   BILITOT 0.6 08/24/2020    No results found for: CRP     Component Value Date/Time   ESRSEDRATE 41 (H) 09/11/2014 1057    I have reviewed the micro and lab results in Epic.  Imaging: DG Chest Port 1 View  Result Date: 08/24/2020 CLINICAL DATA:  Acute respiratory failure with hypoxemia EXAM: PORTABLE CHEST 1 VIEW COMPARISON:  02/12/2020 FINDINGS: Pulmonary fibrosis by chest CT. The pattern is similar to prior no convincing change in density. Right PICC with tip at the upper right atrium. No effusion or pneumothorax. Stable borderline heart size IMPRESSION: Pulmonary fibrosis with similar pattern to 2021. No acute superimposed disease is seen, but early  pneumonia could easily be obscured given the extent chronic changes. Electronically Signed   By: Monte Fantasia M.D.   On: 08/24/2020 06:10   DG [facility] Dys Tube Plc W/Fl W/Rad  Result Date: 08/22/2020 CLINICAL DATA:  Inpatient. Small bowel obstruction. Nasogastric feeding tube placement requested under fluoroscopic guidance. EXAM: NASO G TUBE PLACEMENT WITH FL AND WITH RAD CONTRAST:  None. FLUOROSCOPY TIME:  Fluoroscopy Time:  1 minutes 6 seconds  Radiation Exposure Index (if provided by the fluoroscopic device): 3.8 mGy Number of Acquired Spot Images: 0 COMPARISON:  08/21/2020 CT abdomen/pelvis. 01/24/2020 chest CT angiogram. FINDINGS: Nasogastric tube placement was attempted under fluoroscopic guidance, however was unsuccessful due to inability to pass the tube beyond the posterior nasopharynx. IMPRESSION: Technically unsuccessful attempted nasogastric tube placement under fluoroscopic guidance due to obstruction at the level of the posterior nasopharynx. Electronically Signed   By: Ilona Sorrel M.D.   On: 08/22/2020 14:25   DG ATTEMPTED STUDY-NO REPORT  Result Date: 08/22/2020 There is no Radiologist interpretation  for this exam.      Raynelle Highland for [facility] [phone] pager 08/24/2020, 1:05 PM

## 2020-08-24 NOTE — Consult Note (Signed)
NAME:  [name] [name], MRN:  [id number], DOB:  [birth year], LOS: 7 ADMISSION DATE:  08/17/2020, CONSULTATION DATE: 2/13 REFERRING MD:  Allen/CCS, CHIEF COMPLAINT:  resp failure post op    Brief History:  58 yowf with RA/ PF on ofev and prednisone and  2lpm baseline admitted 2/7 with abd pain n and v found to have sbo > lap 2/13 c/w Torsion of small bowel mesentery with dusky but viable associated small bowel so no untwisted and obst relieved and transferred to ICU post op where PCCM service consulted pm 2/13    History of Present Illness:  87 y.o. female with medical history significant of chronic hypoxic respiratory failure on 2L [place], ILD, rheum arthritis. Presenting with N/V ab pain. Reports that it started 3 days PTA, described as generalized, comes in waves, unable to eat x last 2 days PTA  d/t N/V with no BM in over a week.   Eval by gen surgery and went to OR am 2/13 for SBO / torsion revieved and transferred to ICU for post op care p successful extubation in PACU and PCCM asked to eval pm 2/13    Past Medical History:  Allergic rhinitis CAD CHF DJD DVT Dyslipidemia History of pulmonary embolism Hyperlipidemia  Osteoporosis  Psoriasis Pulmonary fibrosis dx 2003/ followed by Mannam on OFEV low dose Rheumatoid arthritis ([place]).   Significant Hospital Events:  2/13:  Exploratory laparotomy, detorsion of small bowel, placement of Stamm gastrostomy tube  Consults:  GI surgery GI medicine   Procedures:  Exploratory laparotomy, detorsion of small bowel, placement of Stamm gastrostomy tube   Significant Diagnostic Tests:     Micro Data:  Resp viral PCR  2/7  Neg  UC   2/11 >>>  Antimicrobials:  Rocephin 2/8 >>> Fagyl  2/13   Interim History / Subjective:  She is feeling better this morning. Complaining of dry mouth. She denies nausea or pain. She denies shortness of breath, cough or sputum production.   Objective   Blood pressure (!) 104/49, pulse 81, temperature 98.3 F  (36.8 C), temperature source Axillary, resp. rate 19, height 5' 4.5" (1.638 m), weight 44.5 kg, SpO2 94 %.        Intake/Output Summary (Last 24 hours) at 08/24/2020 0855 Last data filed at 08/24/2020 0800 Gross per 24 hour  Intake 4644.62 ml  Output 1730 ml  Net 2914.62 ml   Filed Weights   08/17/20 0326  Weight: 44.5 kg    Examination: General: elderly woman, no acute distress, lying in bed. HENT: [place]/AT, dry mucous membranes Lungs: clear to auscultation, no wheezing or rhonchi. Cardiovascular: RRR, no murmurs Abdomen: soft, dressing in place, g-tube in place Extremities: warm, no edema Neuro: moving all extremities, no focal deficits   Resolved Hospital Problem list    Hypernatremia  Assessment & Plan:  Acute on Chronic Hypoexemic respiratory failure secondary to Rheumatoid Arthritis ILD - Has been on OFEV, which is on hold currently - Continue supplemental oxygen, baseline is 2L, wean as able. Goal O2 saturation around 90-92% - Will need outpatient follow up in order to safely resume OFEV for pulmonary fibrosis  Small Bowel Obstruction s/p laparotmy - management per general surgery  Hypotension  - post op likely related to sedation / mild 3rd spacing - Appears to have resolved, no need for vasopressors in past 24 hours - Currently receiving stress dose steroids. Continue to BID dosing today, daily dosing tomorrow and then off on 2/16 - Safe to remove a-line  Best practice (evaluated daily)  Diet: npo  Pain/Anxiety/Delirium protocol (if indicated): per surgery post op VAP protocol (if indicated): n/a DVT prophylaxis:  PAS  GI prophylaxis: PPI  Glucose control: SSI prn  Mobility: up as tolerated  Disposition: ICU  Code Status: Full code   Labs   CBC: Recent Labs  Lab 08/20/20 0617 08/21/20 0613 08/22/20 0634 08/23/20 0613 08/23/20 1059 08/24/20 0408  WBC 13.4* 14.9* 16.6* 14.3*  --  14.3*  NEUTROABS  --   --  12.5*  --   --  11.8*  HGB 12.9 12.1 12.2  11.5* 11.2* 10.3*  HCT 42.6 39.4 40.4 36.8 33.0* 34.5*  MCV 101.2* 99.2 101.0* 99.2  --  104.9*  PLT 310 303 262 201  --  827    Basic Metabolic Panel: Recent Labs  Lab 08/20/20 0617 08/21/20 0613 08/22/20 0634 08/23/20 0613 08/23/20 1059 08/24/20 0408  NA 142 143 146* 145 147* 141  K 3.9 3.7 3.8 3.5 3.5 4.1  CL 98 103 104 109  --  108  CO2 27 25 27 26   --  25  GLUCOSE 94 82 80 216*  --  187*  BUN 31* 25* 24* 23  --  16  CREATININE 0.91 0.76 0.80 0.71  --  0.46  CALCIUM 9.6 9.2 9.5 9.0  --  7.8*  MG  --   --  1.9 1.8  --  1.9  PHOS  --   --  1.7* 1.7*  --  2.3*   GFR: Estimated Creatinine Clearance: 34.8 mL/min (by C-G formula based on SCr of 0.46 mg/dL). Recent Labs  Lab 08/21/20 0613 08/22/20 0634 08/23/20 0613 08/24/20 0408  WBC 14.9* 16.6* 14.3* 14.3*    Liver Function Tests: Recent Labs  Lab 08/18/20 0527 08/21/20 0613 08/22/20 0634 08/23/20 0613 08/24/20 0408  AST 20 17 17 20 18   ALT 15 12 12 11 10   ALKPHOS 75 62 66 55 44  BILITOT 1.0 1.3* 1.0 0.7 0.6  PROT 6.9 7.0 6.8 5.9* 5.1*  ALBUMIN 3.4* 3.2* 3.3* 2.8* 2.4*   No results for input(s): LIPASE, AMYLASE in the last 168 hours. No results for input(s): AMMONIA in the last 168 hours.  ABG    Component Value Date/Time   PHART 7.361 08/23/2020 1059   PCO2ART 50.8 (H) 08/23/2020 1059   PO2ART 341 (H) 08/23/2020 1059   HCO3 28.7 (H) 08/23/2020 1059   TCO2 30 08/23/2020 1059   ACIDBASEDEF 2.0 12/19/2006 1241   O2SAT 100.0 08/23/2020 1059     Coagulation Profile: No results for input(s): INR, PROTIME in the last 168 hours.  Cardiac Enzymes: No results for input(s): CKTOTAL, CKMB, CKMBINDEX, TROPONINI in the last 168 hours.  HbA1C: Hgb A1c MFr Bld  Date/Time Value Ref Range Status  01/25/2020 09:42 AM 4.2 (L) 4.8 - 5.6 % Final    Comment:    (NOTE) Pre diabetes:          5.7%-6.4%  Diabetes:              >6.4%  Glycemic control for   <7.0% adults with diabetes     CBG: Recent Labs   Lab 08/23/20 1216 08/23/20 1809 08/23/20 2317 08/24/20 0318  GLUCAP 186* 206* 196* 168*         Surgical History:   Past Surgical History:  Procedure Laterality Date  . ABDOMINAL HYSTERECTOMY  1974  . APPENDECTOMY  1959  . CATARACT EXTRACTION    . LAPAROTOMY N/A 08/23/2020  Procedure: EXPLORATORY LAPAROTOMY small bowel resection gastrostomy tube placement;  Surgeon: Dwan Bolt, MD;  Location: [facility] ORS;  Service: General;  Laterality: N/A;  . LEFT HEART CATHETERIZATION WITH CORONARY ANGIOGRAM N/A 06/18/2013   Procedure: LEFT HEART CATHETERIZATION WITH CORONARY ANGIOGRAM;  Surgeon: Peter M Martinique, MD;  Location: [facility] CATH LAB;  Service: Cardiovascular;  Laterality: N/A;  . TONSILLECTOMY  1941, 1951  . TOTAL KNEE ARTHROPLASTY  1997, 2007   Bilateral     Social History:   reports that she quit smoking about 47 years ago. Her smoking use included cigarettes. She has a 10.00 pack-year smoking history. She has never used smokeless tobacco. She reports that she does not drink alcohol and does not use drugs.   Family History:  Her family history includes Alzheimer's disease in her sister and sister; Cancer in her daughter; Heart attack (age of onset: 72) in her father; Heart disease in her sister; Kidney disease (age of onset: 75) in her daughter.   Allergies Allergies  Allergen Reactions  . Crestor [Rosuvastatin] Other (See Comments)    Muscle pain  . Lactose Intolerance (Gi) Other (See Comments)    Stomach upset  . Penicillins Hives  . Imuran [Azathioprine] Nausea And Vomiting     Home Medications  Prior to Admission medications   Medication Sig Start Date End Date Taking? Authorizing Provider  atorvastatin (LIPITOR) 40 MG tablet Take 1 tablet (40 mg total) by mouth daily. 04/28/20  Yes Burnell Blanks, MD  dapsone 100 MG tablet TAKE 1 TABLET(100 MG) BY MOUTH DAILY Patient taking differently: Take 100 mg by mouth daily. 12/03/19  Yes Mannam, Praveen, MD  furosemide  (LASIX) 20 MG tablet Take 1 tablet by mouth daily as needed for swelling. Patient taking differently: Take 20 mg by mouth daily as needed for fluid or edema. 12/11/18  Yes Burtis Junes, NP  InFLIXimab (REMICADE IV) Inject into the vein as directed. Every 2 months   Yes [provider]  mycophenolate (CELLCEPT) 500 MG tablet 2 tabs bid Patient taking differently: Take 1,000 mg by mouth 2 (two) times daily. 01/27/20  Yes Charlynne Cousins, MD  nitroGLYCERIN (NITROSTAT) 0.4 MG SL tablet Place 1 tablet (0.4 mg total) under the tongue every 5 (five) minutes as needed for chest pain. 06/29/20 09/27/20 Yes Burnell Blanks, MD  predniSONE (DELTASONE) 5 MG tablet TAKE 1 TABLET DAILY WITH BREAKFAST Patient taking differently: Take 5 mg by mouth daily with breakfast. 05/08/20  Yes Mannam, Praveen, MD  aspirin EC 81 MG tablet Take 1 tablet (81 mg total) by mouth daily. Patient not taking: Reported on 08/17/2020 11/30/15   Richardson Dopp T, PA-C  fluticasone [facility]) 50 MCG/ACT nasal spray Place 2 sprays into both nostrils daily as needed for allergies or rhinitis. 03/17/15   Hoyt Koch, MD  metoprolol succinate (TOPROL XL) 25 MG 24 hr tablet Take 1 tablet (25 mg total) by mouth daily. Patient not taking: Reported on 08/17/2020 12/12/18   Richardson Dopp T, PA-C  mirtazapine (REMERON) 15 MG tablet Take 1 tablet (15 mg total) by mouth at bedtime. Patient not taking: Reported on 08/17/2020 07/18/19   Hoyt Koch, MD  Nintedanib (OFEV) 100 MG CAPS Take 1 capsule (100 mg total) by mouth 2 (two) times daily. 08/18/20   Mannam, Hart Robinsons, MD  nystatin-triamcinolone (MYCOLOG II) cream Apply 1 application topically 2 (two) times daily. Patient not taking: Reported on 08/17/2020 04/13/20   Marrian Salvage, [place]  Erin Fulling, MD [facility] Office: [phone]   See Amion for Pager Details    After 7:00 pm call Elink  [phone]

## 2020-08-24 NOTE — Progress Notes (Signed)
PROGRESS NOTE    [name] [name]  DHR:[id number] DOB: 04-24-1931 DOA: 08/17/2020 PCP: Hoyt Koch, MD   Chief Complaint  Patient presents with  . Abdominal Pain  Brief Narrative: 87 year old female with history of chronic hypoxic respiratory failure on 2 L nasal cannula secondary to ILD/pulmonary fibrosis,rheumatoid arthritis on chronic prednisone, CAD/CHF, dyslipidemia, history of DVT, presented with nausea vomiting abdominal pain, onset 3 days prior to admission.Patient seen in the ED 2/7 reported having a bowel movement a week prior to admission in the ED found to have SBO, fecal impaction disimpacted x2 with enema, CCS was consulted for SBO and was admitted for further management. 2/8- started on CLD, but had episode of vomiting on 2/9 - had Xray abd that showed normal bowel gas pattern. She is followed closely by general surgery.  Again had episode of vomiting 2/10 am, surgery planned for enema.Nephew wanted to look into second opinion at [facility]- called  and discussed w/ Transfer center , they are at capacity- was called back by Hospitalist from [facility] and they have declined transfer, nephew made aware of.2/11-GI also consulted, repeat CT abdomen done that showed -progressive small bowel obstruction with significant gastric and small bowel distention, transition point associated with an intraluminal lesion in the mid distal ileum in the mid pelvis, most likely a large gallstone ileus. 2/12: Multiple attempts for NG tube insertion has been failed-by nursing/surgeon as well as IR. 2/13: Patient with ongoing more abdominal pain significant tenderness and some confusion and CCS planning for surgery after discussing with patient and Nephew.  Subjective: Seen and examined this morning. Alert awake oriented to people place but mildly confused. Blood pressure holding in 110, afebrile, on 3 to nasal cannula A-line in place Foley in place gastric tube drain to gravity without much  output  Assessment & Plan:  SBO/torsion of the small bowel mesentery: Status post exlaparotomy, detorsion of small bowel and placement of gastrostomy tube 2/13 ( Dr Zenia Resides).  Postop pain is controlled, continue current plan of care with OOB to chair, Tylenol/Robaxin, ambulation NPO as per CCS. input appreciated.   Hypotension postop in the setting of third spacing/Anaesthesia/dehydration.  Not needing pressors, removing A-line per ICU, patient had bolus of normal saline postop.  Continue TPN/maintenance ivf.  Acute on chronic hypoxic respiratory failure on 2 L [place]: Needing more oxygen postoperatively.  Continue supplemental oxygen wean as tolerated home setting 2 L.  Encourage incentive spirometry, PT OT OOB to chair.   ILD/pulmonary fibrosis/Rheumatoid arthritis on chronic prednisone, Ofev, CellCept/Remicade outpatient infusion: Unable to take p.o. switched to iv hydrocortisone-and on his stress test currently. I had communicated to her Pulmonologist Dr Vaughan Browner and okay to hold her ILD meds for now-hopefully resume once able to take p.o. or post discharge.   Hx of CAD/CHF ( D) /Dyslipidemia: No fluid overload.  Monitor fluid status. On TPN. Normally she takes Lasix 20 mg as needed for fluid or edema. She has not been taking aspirin and metoprolol at home prior to admission.  UTI on admission UA grossly abnormal UA, repeat UA 2/12-grossly abnormal ( wbc>50,rbc>50, cloudy, LE trace) and urine culture >100,000 Pseudomonas aeruginosa and Enterococcus faecalis. Has had persistent leucocytosis (chronically on the steroid as well and is immunosuppressed)- Has been on ceftriaxone.Discussed ID Dr Juleen China who will reeval.  History of DVT not on anticoagulation due to history of falls.  Continue Lovenox for DVT prophylaxis.    Normocytic anemia hemoglobin remains stable. 11-12gm.  Monitor Recent Labs  Lab 08/21/20 [phone] 08/22/20  7124 08/23/20 0613 08/23/20 1059 08/24/20 0408  HGB 12.1 12.2 11.5* 11.2*  10.3*  HCT 39.4 40.4 36.8 33.0* 34.5*   Hypophosphatemia cont TPN.    Dehydration/ketosis in the urine, as evidenced by ketones 18. Cont Hydration and TPN.  Goals of care full code.  Multiple comorbidities, with significant pulmonary disease, chronic respiratory failure now with SBO-debilitated.palliative care consultation has been requested to follow along.  Nutrition Status: Severe protein calorie malnourishment present.  Has been on TPN 2/12.  Pharmacy dietitian following. Nutrition Problem: Severe Malnutrition Etiology: chronic illness (ILD/pulmonary fibrosis) Signs/Symptoms: severe fat depletion,severe muscle depletion Interventions: MVI,Boost Plus,Refer to RD note for recommendations  Diet Order            Diet NPO time specified  Diet effective now                DVT prophylaxis: enoxaparin (LOVENOX) injection 30 mg Start: 08/17/20 2200 Code Status:   Code Status: Full Code  Family Communication: plan of care discussed with patient. I have had extensive discussion with patient's nephew previously. No family at bedside today. I called and updated him today.  Status is Inpatient Remains inpatient appropriate because:IV treatments appropriate due to intensity of illness or inability to take PO and Inpatient level of care appropriate due to severity of illness  Dispo: The patient is from: Home             Anticipated d/c is to: tbd              Anticipated d/c date is: >3 days             Patient currently is not medically stable to d/c.  Difficult to place patient No   Consultants: CCS GI PALLIATIVE MEDICINE PCCM   Procedures: EX LAP 2/13  Culture/Microbiology Other culture-see note  Medications: Scheduled Meds: . bisacodyl  10 mg Rectal Daily  . chlorhexidine  15 mL Mouth Rinse BID  . Chlorhexidine Gluconate Cloth  6 each Topical Daily  . enoxaparin (LOVENOX) injection  30 mg Subcutaneous Q24H  . hydrocortisone sod succinate (SOLU-CORTEF) inj  100 mg  Intravenous Q12H  . insulin aspart  0-9 Units Subcutaneous Q6H  . mouth rinse  15 mL Mouth Rinse q12n4p  . mupirocin ointment  1 application Nasal BID  . sodium chloride flush  10-40 mL Intracatheter Q12H   Continuous Infusions: . sodium chloride Stopped (08/24/20 0943)  . sodium chloride 10 mL/hr at 08/24/20 0944  . acetaminophen    . cefTRIAXone (ROCEPHIN)  IV 200 mL/hr at 08/24/20 0944  . methocarbamol (ROBAXIN) IV Stopped (08/24/20 0942)  . TPN ADULT (ION) 60 mL/hr at 08/24/20 0944  . TPN ADULT (ION)      Antimicrobials: Anti-infectives (From admission, onward)   Start     Dose/Rate Route Frequency Ordered Stop   08/23/20 0930  ceFAZolin (ANCEF) IVPB 2g/100 mL premix  Status:  Discontinued       "And" Linked Group Details   2 g 200 mL/hr over 30 Minutes Intravenous On call to O.R. 08/23/20 5809 08/23/20 0841   08/23/20 0930  metroNIDAZOLE (FLAGYL) IVPB 500 mg       "And" Linked Group Details   500 mg 100 mL/hr over 60 Minutes Intravenous On call to O.R. 08/23/20 0841 08/23/20 1027   08/23/20 0923  ceFAZolin (ANCEF) 2-4 GM/100ML-% IVPB  Status:  Discontinued       Note to Pharmacy: Enrigue Catena   : cabinet override  08/23/20 0923 08/23/20 0938   08/19/20 1000  dapsone tablet 100 mg  Status:  Discontinued       Note to Pharmacy: TAKE 1 TABLET(100 MG) BY MOUTH DAILY     100 mg Oral Daily 08/18/20 1350 08/22/20 0805   08/18/20 1000  cefTRIAXone (ROCEPHIN) 1 g in sodium chloride 0.9 % 100 mL IVPB        1 g 200 mL/hr over 30 Minutes Intravenous Every 24 hours 08/17/20 1414     08/17/20 0615  cefTRIAXone (ROCEPHIN) 1 g in sodium chloride 0.9 % 100 mL IVPB        1 g 200 mL/hr over 30 Minutes Intravenous  Once 08/17/20 0605 08/17/20 0801     Objective: Vitals: Today's Vitals   08/24/20 0821 08/24/20 0846 08/24/20 0851 08/24/20 0917  BP:  (!) 108/50  (!) 98/45  Pulse:  84  81  Resp:  (!) 24  17  Temp:      TempSrc:      SpO2:  97%  96%  Weight:      Height:       PainSc: 10-Worst pain ever  Asleep     Intake/Output Summary (Last 24 hours) at 08/24/2020 0948 Last data filed at 08/24/2020 0944 Gross per 24 hour  Intake 5511.87 ml  Output 1805 ml  Net 3706.87 ml   Filed Weights   08/17/20 0326  Weight: 44.5 kg   Weight change:   Intake/Output from previous day: 02/13 0701 - 02/14 0700 In: 4644.6 [I.V.:3093.6; IV Piggyback:1551] Out: 1700 [Urine:635; Drains:770; Blood:50] Intake/Output this shift: Total I/O In: 867.3 [I.V.:786.2; IV Piggyback:81.1] Out: 350 [Urine:350] Filed Weights   08/17/20 0326  Weight: 44.5 kg   Examination:  General exam: AAOx2-3, thin frail cachectic, on 3 L nasal cannula HEENT:Oral mucosa moist, Ear/Nose WNL grossly, dentition normal. Respiratory system: bilaterally posterior crackles present unchanged or dry, no use of accessory muscle Cardiovascular system: S1 & S2 +, No JVD,. Gastrointestinal system: Abdomen soft, gastrostomy tube present, bowel sounds present, tenderness as expected surgical site dressing intact Nervous System:Alert, awake, moving extremities and grossly nonfocal Extremities: No edema, distal peripheral pulses palpable.  Skin: No rashes,no icterus. MSK: Normal muscle bulk,tone, power  Data Reviewed: I have personally reviewed following labs and imaging studies CBC: Recent Labs  Lab 08/20/20 0617 08/21/20 0613 08/22/20 0634 08/23/20 0613 08/23/20 1059 08/24/20 0408  WBC 13.4* 14.9* 16.6* 14.3*  --  14.3*  NEUTROABS  --   --  12.5*  --   --  11.8*  HGB 12.9 12.1 12.2 11.5* 11.2* 10.3*  HCT 42.6 39.4 40.4 36.8 33.0* 34.5*  MCV 101.2* 99.2 101.0* 99.2  --  104.9*  PLT 310 303 262 201  --  174   Basic Metabolic Panel: Recent Labs  Lab 08/20/20 0617 08/21/20 0613 08/22/20 0634 08/23/20 0613 08/23/20 1059 08/24/20 0408  NA 142 143 146* 145 147* 141  K 3.9 3.7 3.8 3.5 3.5 4.1  CL 98 103 104 109  --  108  CO2 27 25 27 26   --  25  GLUCOSE 94 82 80 216*  --  187*  BUN 31* 25*  24* 23  --  16  CREATININE 0.91 0.76 0.80 0.71  --  0.46  CALCIUM 9.6 9.2 9.5 9.0  --  7.8*  MG  --   --  1.9 1.8  --  1.9  PHOS  --   --  1.7* 1.7*  --  2.3*   GFR:  Estimated Creatinine Clearance: 34.8 mL/min (by C-G formula based on SCr of 0.46 mg/dL). Liver Function Tests: Recent Labs  Lab 08/18/20 0527 08/21/20 0613 08/22/20 0634 08/23/20 0613 08/24/20 0408  AST 20 17 17 20 18   ALT 15 12 12 11 10   ALKPHOS 75 62 66 55 44  BILITOT 1.0 1.3* 1.0 0.7 0.6  PROT 6.9 7.0 6.8 5.9* 5.1*  ALBUMIN 3.4* 3.2* 3.3* 2.8* 2.4*   No results for input(s): LIPASE, AMYLASE in the last 168 hours. No results for input(s): AMMONIA in the last 168 hours. Coagulation Profile: No results for input(s): INR, PROTIME in the last 168 hours. Cardiac Enzymes: No results for input(s): CKTOTAL, CKMB, CKMBINDEX, TROPONINI in the last 168 hours. BNP (last 3 results) Recent Labs    06/03/20 1001  PROBNP 100   HbA1C: No results for input(s): HGBA1C in the last 72 hours. CBG: Recent Labs  Lab 08/23/20 1216 08/23/20 1809 08/23/20 2317 08/24/20 0318  GLUCAP 186* 206* 196* 168*   Lipid Profile: Recent Labs    08/22/20 0634 08/24/20 0408  TRIG 88 39   Thyroid Function Tests: No results for input(s): TSH, T4TOTAL, FREET4, T3FREE, THYROIDAB in the last 72 hours. Anemia Panel: No results for input(s): VITAMINB12, FOLATE, FERRITIN, TIBC, IRON, RETICCTPCT in the last 72 hours. Sepsis Labs: No results for input(s): PROCALCITON, LATICACIDVEN in the last 168 hours.  Recent Results (from the past 240 hour(s))  SARS CORONAVIRUS 2 (TAT 6-24 HRS) Nasopharyngeal Nasopharyngeal Swab     Status: None   Collection Time: 08/17/20 12:28 PM   Specimen: Nasopharyngeal Swab  Result Value Ref Range Status   SARS Coronavirus 2 NEGATIVE NEGATIVE Final    Comment: (NOTE) SARS-CoV-2 target nucleic acids are NOT DETECTED.  The SARS-CoV-2 RNA is generally detectable in upper and lower respiratory specimens during  the acute phase of infection. Negative results do not preclude SARS-CoV-2 infection, do not rule out co-infections with other pathogens, and should not be used as the sole basis for treatment or other patient management decisions. Negative results must be combined with clinical observations, patient history, and epidemiological information. The expected result is Negative.  Fact Sheet for Patients: SugarRoll.be  Fact Sheet for Healthcare Providers: https://www.woods-mathews.com/  This test is not yet approved or cleared by the [place] FDA and  has been authorized for detection and/or diagnosis of SARS-CoV-2 by FDA under an Emergency Use Authorization (EUA). This EUA will remain  in effect (meaning this test can be used) for the duration of the COVID-19 declaration under Se ction 564(b)(1) of the Act, 21 U.S.C. section 360bbb-3(b)(1), unless the authorization is terminated or revoked sooner.  Performed at [facility], [place] [address], [place], [place] [address]   Culture, Urine     Status: Abnormal (Preliminary result)   Collection Time: 08/21/20  5:40 PM   Specimen: Urine, Clean Catch  Result Value Ref Range Status   Specimen Description   Final    URINE, CLEAN CATCH Performed at [facility], [place] [address] [place], [place] [address]    Special Requests   Final    NONE Performed at [facility], [place] [address], [place], [place] [address]    Culture (A)  Final    >=100,000 COLONIES/mL PSEUDOMONAS AERUGINOSA >=100,000 COLONIES/mL ENTEROCOCCUS FAECALIS SUSCEPTIBILITIES TO FOLLOW Performed at [facility], [place] [address], [place], [place] [address]    Report Status PENDING  Incomplete  MRSA PCR Screening     Status:  Abnormal   Collection Time: 08/23/20  1:27 PM   Specimen: Nasal Mucosa; Nasopharyngeal  Result Value Ref Range Status   MRSA by PCR POSITIVE (A) NEGATIVE Final     Comment:        The GeneXpert MRSA Assay (FDA approved for NASAL specimens only), is one component of a comprehensive MRSA colonization surveillance program. It is not intended to diagnose MRSA infection nor to guide or monitor treatment for MRSA infections. RESULT CALLED TO, READ BACK BY AND VERIFIED WITH: CARTER,K RN @1459  ON 08/23/20 JACKSON,K Performed at [facility], [place] [address], [place], [place] [address]      Radiology Studies: [facility] Chest Port 1 View  Result Date: 08/24/2020 CLINICAL DATA:  Acute respiratory failure with hypoxemia EXAM: PORTABLE CHEST 1 VIEW COMPARISON:  02/12/2020 FINDINGS: Pulmonary fibrosis by chest CT. The pattern is similar to prior no convincing change in density. Right PICC with tip at the upper right atrium. No effusion or pneumothorax. Stable borderline heart size IMPRESSION: Pulmonary fibrosis with similar pattern to 2021. No acute superimposed disease is seen, but early pneumonia could easily be obscured given the extent chronic changes. Electronically Signed   By: Monte Fantasia M.D.   On: 08/24/2020 06:10   DG [facility] Dys Tube Plc W/Fl W/Rad  Result Date: 08/22/2020 CLINICAL DATA:  Inpatient. Small bowel obstruction. Nasogastric feeding tube placement requested under fluoroscopic guidance. EXAM: NASO G TUBE PLACEMENT WITH FL AND WITH RAD CONTRAST:  None. FLUOROSCOPY TIME:  Fluoroscopy Time:  1 minutes 6 seconds Radiation Exposure Index (if provided by the fluoroscopic device): 3.8 mGy Number of Acquired Spot Images: 0 COMPARISON:  08/21/2020 CT abdomen/pelvis. 01/24/2020 chest CT angiogram. FINDINGS: Nasogastric tube placement was attempted under fluoroscopic guidance, however was unsuccessful due to inability to pass the tube beyond the posterior nasopharynx. IMPRESSION: Technically unsuccessful attempted nasogastric tube placement under fluoroscopic guidance due to obstruction at the level of the posterior nasopharynx. Electronically  Signed   By: Ilona Sorrel M.D.   On: 08/22/2020 14:25   DG ATTEMPTED STUDY-NO REPORT  Result Date: 08/22/2020 There is no Radiologist interpretation  for this exam.    LOS: 7 days   Antonieta Pert, MD [facility]  08/24/2020, 9:48 AM

## 2020-08-24 NOTE — Progress Notes (Signed)
Physical Therapy Treatment Patient Details Name: [name] [name] MRN: [id number] DOB: Jan 22, 1931 Today's Date: 08/24/2020    History of Present Illness 87 yo female admitted with SBO, UTI,   S/P Exploratory laparotomy, detorsion of small bowel, placement of Stamm Gastrostomy tube, 08/23/20. Hx of falls, pulm fibrosis, chronic respiratory failure-O2 dep 2L, DVT, osteoporosis, RA, DJD, CAD, CHF    PT Comments    Patient drowsy, aroused, required mod  assistance to roll and sit up on bed side. Patient sat x ~ 5 minutes. Patient reporting abdominal pain. SP surgery 2/13 with drain. Will assess need to update  DC plan during  next visits. Patient reports living alone.  VSS, BP 114/57 sitting up.  Follow Up Recommendations  Home health PT;Supervision - Intermittent     Equipment Recommendations  None recommended by PT    Recommendations for Other Services       Precautions / Restrictions Precautions Precaution Comments: O2 dep,  gastrostomy tube drain, Required Braces or Orthoses: Other Brace Other Brace: abdominal binder    Mobility  Bed Mobility   Bed Mobility: Rolling;Sidelying to Sit;Sit to Sidelying Rolling: Max assist Sidelying to sit: Max assist     Sit to sidelying: Max assist General bed mobility comments: cues for precautions of Abdomen    Transfers                 General transfer comment: NT  Ambulation/Gait                 Stairs             Wheelchair Mobility    Modified Rankin (Stroke Patients Only)       Balance Overall balance assessment: Needs assistance Sitting-balance support: Feet supported;Bilateral upper extremity supported Sitting balance-[name]: Fair Sitting balance - Comments: able to sit unsupported EOB 1-2 minutes       Standing balance comment: NT                            Cognition Arousal/Alertness: Lethargic Behavior During Therapy: WFL for tasks assessed/performed Overall Cognitive  Status: No family/caregiver present to determine baseline cognitive functioning Area of Impairment: Orientation                 Orientation Level: Time;Situation             General Comments: patient very groggy, slow to respond, arouses  when moving      Exercises      General Comments        Pertinent Vitals/Pain Pain Score: 10-Worst pain ever Pain Location: abdomen Pain Descriptors / Indicators: Discomfort;Grimacing;Guarding Pain Intervention(s): Monitored during session;Limited activity within patient's tolerance;Patient requesting pain meds-RN notified    Home Living                      Prior Function            PT Goals (current goals can now be found in the care plan section) Acute Rehab PT Goals Time For Goal Achievement: 09/03/20 Potential to Achieve Goals: Fair Progress towards PT goals: Progressing toward goals    Frequency    Min 3X/week      PT Plan Current plan remains appropriate    Co-evaluation              AM-PAC PT "6 Clicks" Mobility   Outcome Measure  Help needed turning from your back to your  side while in a flat bed without using bedrails?: A Lot Help needed moving from lying on your back to sitting on the side of a flat bed without using bedrails?: A Lot Help needed moving to and from a bed to a chair (including a wheelchair)?: Total Help needed standing up from a chair using your arms (e.g., wheelchair or bedside chair)?: Total Help needed to walk in hospital room?: Total Help needed climbing 3-5 steps with a railing? : Total 6 Click Score: 8    End of Session Equipment Utilized During Treatment: Oxygen Activity Tolerance: Patient limited by lethargy;Patient limited by pain Patient left: in bed;with call bell/phone within reach;with bed alarm set;with nursing/sitter in room Nurse Communication: Mobility status PT Visit Diagnosis: Muscle weakness (generalized) (M62.81);Unsteadiness on feet (R26.81)      Time: 7169-6789 PT Time Calculation (min) (ACUTE ONLY): 19 min  Charges:  $Therapeutic Activity: 8-22 mins                     Tresa Endo PT Acute Rehabilitation Services Pager [phone] Office [phone]    Claretha Cooper 08/24/2020, 2:27 PM

## 2020-08-24 NOTE — Progress Notes (Addendum)
1 Day Post-Op    CC:  Abdominal pain  Subjective: Pt is awake, comfortable on nasal cannula, complains of pain.  Dressing is dry, incision looks fine.  G tube draining.  Clear anterior exam.  She has mittens on and a binder to keep her from play with the G tube. She has not had anything for pain since 3 AM.  Objective: Vital signs in last 24 hours: Temp:  [97.4 F (36.3 C)-98.3 F (36.8 C)] 98.3 F (36.8 C) (02/14 0400) Pulse Rate:  [69-108] 81 (02/14 0345) Resp:  [14-33] 19 (02/14 0430) BP: (81-144)/(44-93) 104/49 (02/14 0305) SpO2:  [75 %-100 %] 94 % (02/14 0345) Arterial Line BP: (66-171)/(41-113) 108/74 (02/14 0430) Last BM Date: 08/21/20 4644 IV Urine 635 Drain 770 Afebrile, VSS prealbumin 6.0 K+4.1, Mag 1.9 WBC 14.3 H/H 10.3/34.5 Platelets 157K Afebrile, SBP 100 range,    Intake/Output from previous day: 02/13 0701 - 02/14 0700 In: 4644.6 [I.V.:3093.6; IV Piggyback:1551] Out: 9211 [Urine:635; Drains:770; Blood:50] Intake/Output this shift: No intake/output data recorded.  General appearance: alert, cooperative and no distress Resp: clear to auscultation bilaterally and anterior GI: soft, not distended, no BS, g tube draining.  Incision OK  Lab Results:  Recent Labs    08/23/20 0613 08/23/20 1059 08/24/20 0408  WBC 14.3*  --  14.3*  HGB 11.5* 11.2* 10.3*  HCT 36.8 33.0* 34.5*  PLT 201  --  157    BMET Recent Labs    08/23/20 0613 08/23/20 1059 08/24/20 0408  NA 145 147* 141  K 3.5 3.5 4.1  CL 109  --  108  CO2 26  --  25  GLUCOSE 216*  --  187*  BUN 23  --  16  CREATININE 0.71  --  0.46  CALCIUM 9.0  --  7.8*   PT/INR No results for input(s): LABPROT, INR in the last 72 hours.  Recent Labs  Lab 08/18/20 0527 08/21/20 0613 08/22/20 0634 08/23/20 0613 08/24/20 0408  AST 20 17 17 20 18   ALT 15 12 12 11 10   ALKPHOS 75 62 66 55 44  BILITOT 1.0 1.3* 1.0 0.7 0.6  PROT 6.9 7.0 6.8 5.9* 5.1*  ALBUMIN 3.4* 3.2* 3.3* 2.8* 2.4*      Lipase     Component Value Date/Time   LIPASE 27 08/17/2020 0410     Medications:  bisacodyl  10 mg Rectal Daily   chlorhexidine  15 mL Mouth Rinse BID   Chlorhexidine Gluconate Cloth  6 each Topical Daily   enoxaparin (LOVENOX) injection  30 mg Subcutaneous Q24H   hydrocortisone sod succinate (SOLU-CORTEF) inj  100 mg Intravenous Q12H   insulin aspart  0-9 Units Subcutaneous Q6H   mouth rinse  15 mL Mouth Rinse q12n4p   mupirocin ointment  1 application Nasal BID   sodium chloride flush  10-40 mL Intracatheter Q12H    sodium chloride 75 mL/hr at 08/24/20 0622   sodium chloride 10 mL/hr at 08/24/20 0400   cefTRIAXone (ROCEPHIN)  IV 0 mL/hr at 08/22/20 1036   famotidine (PEPCID) IV Stopped (08/23/20 1619)   potassium PHOSPHATE IVPB (in mmol)     TPN ADULT (ION) 60 mL/hr at 08/24/20 0400    Assessment/Plan Pulmonary fibrosis/interstitial lung disease Rheumatoid arthritis  - cellcept/Remicade/predinsone Hx CAD/CHF Hx of DVT  Anemia Severe malnutrition- prealbumin 6.0  SBO/Torsion of small bowel mesentary Exploratory laparotomy, detorsion of small bowel, placement of Stamm Gastrostomy tube, 08/23/20, Michaelle Birks  FEN:  NPO/TPN; G-tube to straight drain;  awaiting return of bowel function ID: rocephin 2/7>> day 7 DVT: Lovenox Follow up:  Dr. Zenia Resides Foley:  In with minimal output last shift  Plan:  Stable overall; add IV Tylenol and Robaxin for pain relief, to minimize narcotics.  OOB today to chair.  Leave foley in today until she is mobilizing better.    LOS: 7 days    JENNINGS,WILLARD 08/24/2020 Please see Amion

## 2020-08-24 NOTE — TOC Progression Note (Signed)
Transition of Care [facility]) - Progression Note    Patient Details  Name: [name] [name] MRN: [id number] Date of Birth: 09/30/1930  Transition of Care [facility]) CM/SW Contact  Leeroy Cha, RN Phone Number: 08/24/2020, 7:35 AM  Clinical Narrative:    87 y.o.femalewith medical history significant ofchronic hypoxic respiratory failureon 2L [place],ILD, rheum arthritis. Presenting with N/V ab pain. Reports that it started 3 days PTA, described as generalized, comes in waves, unable to eat x last 2 days PTA  d/t N/V with no BM in over a week.   Eval by gen surgery and went to OR am 2/13 for SBO / torsion revieved and transferred to ICU for post op care p successful extubation in PACU and PCCM asked to eval pm 2/13  PLAN: TO RETURN TO HOME MAY NEED [place].  Expected Discharge Plan: [place] Barriers to Discharge: Continued Medical Work up  Expected Discharge Plan and Services Expected Discharge Plan: [place]   Discharge Planning Services: CM Consult Post Acute Care Choice: [place] arrangements for the past 2 months: [place]: PT,OT [place]: [place] Date [facility] Agency Contacted: 08/21/20 Time [place]: [phone] Representative spoke with at [place]: [place] ([place]) Interventions    Readmission Risk Interventions No flowsheet data found.

## 2020-08-24 NOTE — Progress Notes (Signed)
PHARMACY - TOTAL PARENTERAL NUTRITION CONSULT NOTE   Indication: Small bowel obstruction  Patient Measurements: Height: 5' 4.5" (163.8 cm) Weight: 44.5 kg (98 lb) IBW/kg (Calculated) : 55.85 TPN AdjBW (KG): 44.5 Body mass index is 16.56 kg/m.  Assessment: 87 yo female with persistent SBO that may be result of gallstone ileus. Multiple attempts at NGT have failed; per CCS, at this time no further options besides doing surgery although high risk. She is now s/p exploratory laparotomy, detorsion of small bowel, placement of Stamm gastrostomy tube on 08/23/20.  Glucose / Insulin: CBGs > goal 100-150; - note patient on chronic steroids currently on solucortef 100mg  q12 - add 7 units SSI/24hr required Electrolytes: WNL except phos remains low but improved despite 15 mMol of Kphos 2/12, 15mMol 2/13, mag 1.9 and K 4.1 Renal: Scr 0.46 stable LFTs / TGs: LFTs WNL, TG 39 Prealbumin / albumin: prealb 6.0, alb 2.4 Intake / Output; MIVF: I/O questionable documentation, 1/2 NS at 75 ml/hr GI Imaging: SBO per CT Surgeries / Procedures: 2/13 ex lap  Central access: 2/12 - PICC TPN start date: 2/12  Nutritional Goals (per RD recommendation on 2/11): kCal: 1500-1700, Protein: 70-85, Fluid: >= 1.4L  Note that currently there is a Lipid emulsion shortage so SMOFlipid will only be added on MWF for appropriate patients  Goal TPN rate MWF with lipid is 60 mL/hr (provides 72 g of protein and 1699 kcals per day)  Goal TPN rate TuThSatSun without lipid is 60 ml/hr (provides 72g protein and 1267 kcal)   Current Nutrition:  NPO  TPN at 60 ml/hr  Plan:  Now: Kphos 15 mMol  IV x 1  Continue TPN at goal rate 60 ml/hr - add lipids today Electrolytes in TPN: 11mEq/L of Na, 16mEq/L of K, 21mEq/L of Ca, 73mEq/L of Mg, and 50mmol/L of Phos. Cl:Ac ratio 1:1 Add standard MVI and trace elements to TPN Sensitive q6 SSI and adjust as needed  Stop pepcid 20mg  boluses and to TPN Continue MIVF per CCS - 1/2 NS  decrease to 30 ml/mhr Monitor TPN labs on Mon/Thurs and as needed  Peggyann Juba, PharmD, BCPS Pharmacy: [phone] 08/24/2020,7:28 AM

## 2020-08-25 DIAGNOSIS — J9621 Acute and chronic respiratory failure with hypoxia: Secondary | ICD-10-CM | POA: Diagnosis not present

## 2020-08-25 DIAGNOSIS — K56609 Unspecified intestinal obstruction, unspecified as to partial versus complete obstruction: Secondary | ICD-10-CM | POA: Diagnosis not present

## 2020-08-25 LAB — COMPREHENSIVE METABOLIC PANEL
ALT: 10 U/L (ref 0–44)
AST: 15 U/L (ref 15–41)
Albumin: 2.2 g/dL — ABNORMAL LOW (ref 3.5–5.0)
Alkaline Phosphatase: 45 U/L (ref 38–126)
Anion gap: 7 (ref 5–15)
BUN: 14 mg/dL (ref 8–23)
CO2: 26 mmol/L (ref 22–32)
Calcium: 8.2 mg/dL — ABNORMAL LOW (ref 8.9–10.3)
Chloride: 104 mmol/L (ref 98–111)
Creatinine, Ser: 0.43 mg/dL — ABNORMAL LOW (ref 0.44–1.00)
GFR, Estimated: >60 mL/min (ref >60)
Glucose, Bld: 159 mg/dL — ABNORMAL HIGH (ref 70–99)
Potassium: 4.2 mmol/L (ref 3.5–5.1)
Sodium: 137 mmol/L (ref 135–145)
Total Bilirubin: 0.6 mg/dL (ref 0.3–1.2)
Total Protein: 4.9 g/dL — ABNORMAL LOW (ref 6.5–8.1)

## 2020-08-25 LAB — GLUCOSE, CAPILLARY
Glucose-Capillary: 131 mg/dL — ABNORMAL HIGH (ref 70–99)
Glucose-Capillary: 139 mg/dL — ABNORMAL HIGH (ref 70–99)
Glucose-Capillary: 143 mg/dL — ABNORMAL HIGH (ref 70–99)
Glucose-Capillary: 163 mg/dL — ABNORMAL HIGH (ref 70–99)
Glucose-Capillary: 169 mg/dL — ABNORMAL HIGH (ref 70–99)
Glucose-Capillary: 176 mg/dL — ABNORMAL HIGH (ref 70–99)

## 2020-08-25 LAB — CBC
HCT: 30.1 % — ABNORMAL LOW (ref 36.0–46.0)
Hemoglobin: 9.4 g/dL — ABNORMAL LOW (ref 12.0–15.0)
MCH: 31.1 pg (ref 26.0–34.0)
MCHC: 31.2 g/dL (ref 30.0–36.0)
MCV: 99.7 fL (ref 80.0–100.0)
Platelets: 145 10*3/uL — ABNORMAL LOW (ref 150–400)
RBC: 3.02 MIL/uL — ABNORMAL LOW (ref 3.87–5.11)
RDW: 14.1 % (ref 11.5–15.5)
WBC: 18.5 10*3/uL — ABNORMAL HIGH (ref 4.0–10.5)
nRBC: 0 % (ref 0.0–0.2)

## 2020-08-25 LAB — URINE CULTURE: Culture: >=100000 — AB

## 2020-08-25 LAB — MAGNESIUM: Magnesium: 1.6 mg/dL — ABNORMAL LOW (ref 1.7–2.4)

## 2020-08-25 LAB — PHOSPHORUS: Phosphorus: 2.1 mg/dL — ABNORMAL LOW (ref 2.5–4.6)

## 2020-08-25 MED ORDER — TRAVASOL 10 % IV SOLN
INTRAVENOUS | Status: AC
  Filled 2020-08-25: qty 720

## 2020-08-25 MED ORDER — MAGNESIUM SULFATE 4 GM/100ML IV SOLN
4.0000 g | Freq: Once | INTRAVENOUS | Status: AC
  Administered 2020-08-25: 4 g via INTRAVENOUS
  Filled 2020-08-25: qty 100

## 2020-08-25 MED ORDER — INSULIN ASPART 100 UNIT/ML ~~LOC~~ SOLN
0.0000 [IU] | SUBCUTANEOUS | Status: DC
  Administered 2020-08-25: 3 [IU] via SUBCUTANEOUS
  Administered 2020-08-25 (×3): 2 [IU] via SUBCUTANEOUS
  Administered 2020-08-25: 3 [IU] via SUBCUTANEOUS
  Administered 2020-08-26: 2 [IU] via SUBCUTANEOUS
  Administered 2020-08-26: 3 [IU] via SUBCUTANEOUS
  Administered 2020-08-26 (×2): 2 [IU] via SUBCUTANEOUS
  Administered 2020-08-26: 3 [IU] via SUBCUTANEOUS
  Administered 2020-08-27: 2 [IU] via SUBCUTANEOUS

## 2020-08-25 MED ORDER — POTASSIUM PHOSPHATES 15 MMOLE/5ML IV SOLN
30.0000 mmol | Freq: Once | INTRAVENOUS | Status: AC
  Administered 2020-08-25: 30 mmol via INTRAVENOUS
  Filled 2020-08-25: qty 10

## 2020-08-25 NOTE — Consult Note (Signed)
NAME:  [name] [name], MRN:  [id number], DOB:  06-27-1931, LOS: 8 ADMISSION DATE:  08/17/2020, CONSULTATION DATE: 2/13 REFERRING MD:  Allen/CCS, CHIEF COMPLAINT:  resp failure post op    Brief History:  87 yowf with RA/ PF on ofev and prednisone and  2lpm baseline admitted 2/7 with abd pain n and v found to have sbo > lap 2/13 c/w Torsion of small bowel mesentery with dusky but viable associated small bowel so no untwisted and obst relieved and transferred to ICU post op where PCCM service consulted pm 2/13    History of Present Illness:  87 y.o. female with medical history significant of chronic hypoxic respiratory failure on 2L [place], ILD, rheum arthritis. Presenting with N/V ab pain. Reports that it started 3 days PTA, described as generalized, comes in waves, unable to eat x last 2 days PTA  d/t N/V with no BM in over a week.   Eval by gen surgery and went to OR am 2/13 for SBO / torsion revieved and transferred to ICU for post op care p successful extubation in PACU and PCCM asked to eval pm 2/13    Past Medical History:  Allergic rhinitis CAD CHF DJD DVT Dyslipidemia History of pulmonary embolism Hyperlipidemia  Osteoporosis  Psoriasis Pulmonary fibrosis dx 2003/ followed by Mannam on OFEV low dose Rheumatoid arthritis ([place]).   Significant Hospital Events:  2/13:  Exploratory laparotomy, detorsion of small bowel, placement of Stamm gastrostomy tube  Consults:  GI surgery GI medicine   Procedures:  Exploratory laparotomy, detorsion of small bowel, placement of Stamm gastrostomy tube   Significant Diagnostic Tests:     Micro Data:  Resp viral PCR  2/7  Neg  UC   2/11 >>>  Antimicrobials:  Rocephin 2/8 >>> Fagyl  2/13   Interim History / Subjective:  She had an increase in her abdominal pain overnight. No bowel movement and states she is not passing flatus. She has eaten a couple ice chips this morning so far without issue. No breathing issues at this time.  A-line  removed yesterday.   Objective   Blood pressure 116/77, pulse 84, temperature 98 F (36.7 C), temperature source Axillary, resp. rate (!) 27, height 5' 4.5" (1.638 m), weight 44.5 kg, SpO2 93 %.        Intake/Output Summary (Last 24 hours) at 08/25/2020 0816 Last data filed at 08/25/2020 0600 Gross per 24 hour  Intake 3393.17 ml  Output 965 ml  Net 2428.17 ml   Filed Weights   08/17/20 0326  Weight: 44.5 kg    Examination: General: elderly woman, no acute distress, lying in bed. HENT: [place]/AT, dry mucous membranes Lungs: clear to auscultation, no wheezing or rhonchi. Cardiovascular: RRR, no murmurs Abdomen: soft, dressing in place, g-tube in place, hypoactive bowel sounds Extremities: warm, no edema Neuro: moving all extremities, no focal deficits   Resolved Hospital Problem list    Hypernatremia  Assessment & Plan:  Acute on Chronic Hypoexemic respiratory failure secondary to Rheumatoid Arthritis ILD - Has been on OFEV/Prednisone/CellCept which are on hold currently - Continue supplemental oxygen, baseline is 2L, wean as able. Goal O2 saturation around 90-92% - Will need outpatient follow up in order to safely resume OFEV/CellCept for pulmonary fibrosis  Small Bowel Obstruction s/p laparotmy - management per general surgery  Hypotension  - Currently receiving stress dose steroids.  - Recommend tapering stress dose steroids off in the coming days  Pulmonary will continue to follow intermittently and arrange hospital  follow up when ready.   Best practice (evaluated daily)  Diet: per surgery Pain/Anxiety/Delirium protocol (if indicated): per surgery post op VAP protocol (if indicated): n/a DVT prophylaxis:  PAS  GI prophylaxis: PPI  Glucose control: SSI prn  Mobility: up as tolerated  Disposition: ICU  Code Status: Full code   Labs   CBC: Recent Labs  Lab 08/20/20 0617 08/21/20 0613 08/22/20 0634 08/23/20 0613 08/23/20 1059 08/24/20 0408  WBC 13.4* 14.9*  16.6* 14.3*  --  14.3*  NEUTROABS  --   --  12.5*  --   --  11.8*  HGB 12.9 12.1 12.2 11.5* 11.2* 10.3*  HCT 42.6 39.4 40.4 36.8 33.0* 34.5*  MCV 101.2* 99.2 101.0* 99.2  --  104.9*  PLT 310 303 262 201  --  865    Basic Metabolic Panel: Recent Labs  Lab 08/21/20 0613 08/22/20 0634 08/23/20 0613 08/23/20 1059 08/24/20 0408 08/25/20 0231  NA 143 146* 145 147* 141 137  K 3.7 3.8 3.5 3.5 4.1 4.2  CL 103 104 109  --  108 104  CO2 25 27 26   --  25 26  GLUCOSE 82 80 216*  --  187* 159*  BUN 25* 24* 23  --  16 14  CREATININE 0.76 0.80 0.71  --  0.46 0.43*  CALCIUM 9.2 9.5 9.0  --  7.8* 8.2*  MG  --  1.9 1.8  --  1.9 1.6*  PHOS  --  1.7* 1.7*  --  2.3* 2.1*   GFR: Estimated Creatinine Clearance: 34.8 mL/min (A) (by C-G formula based on SCr of 0.43 mg/dL (L)). Recent Labs  Lab 08/21/20 0613 08/22/20 0634 08/23/20 0613 08/24/20 0408  WBC 14.9* 16.6* 14.3* 14.3*    Liver Function Tests: Recent Labs  Lab 08/21/20 0613 08/22/20 0634 08/23/20 0613 08/24/20 0408 08/25/20 0231  AST 17 17 20 18 15   ALT 12 12 11 10 10   ALKPHOS 62 66 55 44 45  BILITOT 1.3* 1.0 0.7 0.6 0.6  PROT 7.0 6.8 5.9* 5.1* 4.9*  ALBUMIN 3.2* 3.3* 2.8* 2.4* 2.2*   No results for input(s): LIPASE, AMYLASE in the last 168 hours. No results for input(s): AMMONIA in the last 168 hours.  ABG    Component Value Date/Time   PHART 7.361 08/23/2020 1059   PCO2ART 50.8 (H) 08/23/2020 1059   PO2ART 341 (H) 08/23/2020 1059   HCO3 28.7 (H) 08/23/2020 1059   TCO2 30 08/23/2020 1059   ACIDBASEDEF 2.0 12/19/2006 1241   O2SAT 100.0 08/23/2020 1059     Coagulation Profile: No results for input(s): INR, PROTIME in the last 168 hours.  Cardiac Enzymes: No results for input(s): CKTOTAL, CKMB, CKMBINDEX, TROPONINI in the last 168 hours.  HbA1C: Hgb A1c MFr Bld  Date/Time Value Ref Range Status  01/25/2020 09:42 AM 4.2 (L) 4.8 - 5.6 % Final    Comment:    (NOTE) Pre diabetes:           5.7%-6.4%  Diabetes:              >6.4%  Glycemic control for   <7.0% adults with diabetes     CBG: Recent Labs  Lab 08/24/20 1205 08/24/20 1707 08/24/20 2301 08/25/20 0508 08/25/20 0756  GLUCAP 184* 181* 172* 163* 139*         Surgical History:   Past Surgical History:  Procedure Laterality Date  . ABDOMINAL HYSTERECTOMY  1974  . APPENDECTOMY  1959  . CATARACT EXTRACTION    .  LAPAROTOMY N/A 08/23/2020   Procedure: EXPLORATORY LAPAROTOMY small bowel resection gastrostomy tube placement;  Surgeon: Dwan Bolt, MD;  Location: [facility] ORS;  Service: General;  Laterality: N/A;  . LEFT HEART CATHETERIZATION WITH CORONARY ANGIOGRAM N/A 06/18/2013   Procedure: LEFT HEART CATHETERIZATION WITH CORONARY ANGIOGRAM;  Surgeon: Peter M Martinique, MD;  Location: [facility] CATH LAB;  Service: Cardiovascular;  Laterality: N/A;  . TONSILLECTOMY  1941, 1951  . TOTAL KNEE ARTHROPLASTY  1997, 2007   Bilateral     Social History:   reports that she quit smoking about 47 years ago. Her smoking use included cigarettes. She has a 10.00 pack-year smoking history. She has never used smokeless tobacco. She reports that she does not drink alcohol and does not use drugs.   Family History:  Her family history includes Alzheimer's disease in her sister and sister; Cancer in her daughter; Heart attack (age of onset: 72) in her father; Heart disease in her sister; Kidney disease (age of onset: 12) in her daughter.   Allergies Allergies  Allergen Reactions  . Crestor [Rosuvastatin] Other (See Comments)    Muscle pain  . Lactose Intolerance (Gi) Other (See Comments)    Stomach upset  . Penicillins Hives  . Imuran [Azathioprine] Nausea And Vomiting     Home Medications  Prior to Admission medications   Medication Sig Start Date End Date Taking? Authorizing Provider  atorvastatin (LIPITOR) 40 MG tablet Take 1 tablet (40 mg total) by mouth daily. 04/28/20  Yes Burnell Blanks, MD  dapsone 100 MG  tablet TAKE 1 TABLET(100 MG) BY MOUTH DAILY Patient taking differently: Take 100 mg by mouth daily. 12/03/19  Yes Mannam, Praveen, MD  furosemide (LASIX) 20 MG tablet Take 1 tablet by mouth daily as needed for swelling. Patient taking differently: Take 20 mg by mouth daily as needed for fluid or edema. 12/11/18  Yes Burtis Junes, NP  InFLIXimab (REMICADE IV) Inject into the vein as directed. Every 2 months   Yes [provider]  mycophenolate (CELLCEPT) 500 MG tablet 2 tabs bid Patient taking differently: Take 1,000 mg by mouth 2 (two) times daily. 01/27/20  Yes Charlynne Cousins, MD  nitroGLYCERIN (NITROSTAT) 0.4 MG SL tablet Place 1 tablet (0.4 mg total) under the tongue every 5 (five) minutes as needed for chest pain. 06/29/20 09/27/20 Yes Burnell Blanks, MD  predniSONE (DELTASONE) 5 MG tablet TAKE 1 TABLET DAILY WITH BREAKFAST Patient taking differently: Take 5 mg by mouth daily with breakfast. 05/08/20  Yes Mannam, Praveen, MD  aspirin EC 81 MG tablet Take 1 tablet (81 mg total) by mouth daily. Patient not taking: Reported on 08/17/2020 11/30/15   Richardson Dopp T, PA-C  fluticasone [facility]) 50 MCG/ACT nasal spray Place 2 sprays into both nostrils daily as needed for allergies or rhinitis. 03/17/15   Hoyt Koch, MD  metoprolol succinate (TOPROL XL) 25 MG 24 hr tablet Take 1 tablet (25 mg total) by mouth daily. Patient not taking: Reported on 08/17/2020 12/12/18   Richardson Dopp T, PA-C  mirtazapine (REMERON) 15 MG tablet Take 1 tablet (15 mg total) by mouth at bedtime. Patient not taking: Reported on 08/17/2020 07/18/19   Hoyt Koch, MD  Nintedanib (OFEV) 100 MG CAPS Take 1 capsule (100 mg total) by mouth 2 (two) times daily. 08/18/20   Mannam, Hart Robinsons, MD  nystatin-triamcinolone (MYCOLOG II) cream Apply 1 application topically 2 (two) times daily. Patient not taking: Reported on 08/17/2020 04/13/20   Marrian Salvage,  FNP    Freda Jackson, MD [place]  Pulmonary & Critical Care Office: [phone]   See Amion for Pager Details    After 7:00 pm call [place]  [phone]

## 2020-08-25 NOTE — Progress Notes (Signed)
2 Days Post-Op   Subjective/Chief Complaint: No recorded BM, patient reports no flatus Awake, alert Complaining of abdominal pain   Objective: Vital signs in last 24 hours: Temp:  [97.4 F (36.3 C)-98.3 F (36.8 C)] 98 F (36.7 C) (02/15 0400) Pulse Rate:  [68-86] 84 (02/15 0700) Resp:  [17-27] 27 (02/15 0700) BP: (90-124)/(43-77) 116/77 (02/15 0700) SpO2:  [88 %-100 %] 93 % (02/15 0700) Arterial Line BP: (91-117)/(58-98) 91/58 (02/14 0917) Last BM Date: 08/21/20  Intake/Output from previous day: 02/14 0701 - 02/15 0700 In: 3393.2 [I.V.:2714.9; IV Piggyback:678.3] Out: 1240 [Urine:1165; Drains:75] Intake/Output this shift: No intake/output data recorded.  Elderly female Awake, alert Abd - soft, incisional tenderness;  Incision c/d/i through honeycomb dressing G-tube site OK - bilious drainage  Lab Results:  Recent Labs    08/23/20 0613 08/23/20 1059 08/24/20 0408  WBC 14.3*  --  14.3*  HGB 11.5* 11.2* 10.3*  HCT 36.8 33.0* 34.5*  PLT 201  --  157   BMET Recent Labs    08/24/20 0408 08/25/20 0231  NA 141 137  K 4.1 4.2  CL 108 104  CO2 25 26  GLUCOSE 187* 159*  BUN 16 14  CREATININE 0.46 0.43*  CALCIUM 7.8* 8.2*   PT/INR No results for input(s): LABPROT, INR in the last 72 hours. ABG Recent Labs    08/23/20 1059  PHART 7.361  HCO3 28.7*    Studies/Results: DG Chest Port 1 View  Result Date: 08/24/2020 CLINICAL DATA:  Acute respiratory failure with hypoxemia EXAM: PORTABLE CHEST 1 VIEW COMPARISON:  02/12/2020 FINDINGS: Pulmonary fibrosis by chest CT. The pattern is similar to prior no convincing change in density. Right PICC with tip at the upper right atrium. No effusion or pneumothorax. Stable borderline heart size IMPRESSION: Pulmonary fibrosis with similar pattern to 2021. No acute superimposed disease is seen, but early pneumonia could easily be obscured given the extent chronic changes. Electronically Signed   By: Monte Fantasia M.D.   On:  08/24/2020 06:10    Anti-infectives: Anti-infectives (From admission, onward)   Start     Dose/Rate Route Frequency Ordered Stop   08/23/20 0930  ceFAZolin (ANCEF) IVPB 2g/100 mL premix  Status:  Discontinued       "And" Linked Group Details   2 g 200 mL/hr over 30 Minutes Intravenous On call to O.R. 08/23/20 3825 08/23/20 0841   08/23/20 0930  metroNIDAZOLE (FLAGYL) IVPB 500 mg       "And" Linked Group Details   500 mg 100 mL/hr over 60 Minutes Intravenous On call to O.R. 08/23/20 0841 08/23/20 1027   08/23/20 0923  ceFAZolin (ANCEF) 2-4 GM/100ML-% IVPB  Status:  Discontinued       Note to Pharmacy: Enrigue Catena   : cabinet override      08/23/20 0923 08/23/20 0938   08/19/20 1000  dapsone tablet 100 mg  Status:  Discontinued       Note to Pharmacy: TAKE 1 TABLET(100 MG) BY MOUTH DAILY     100 mg Oral Daily 08/18/20 1350 08/22/20 0805   08/18/20 1000  cefTRIAXone (ROCEPHIN) 1 g in sodium chloride 0.9 % 100 mL IVPB  Status:  Discontinued        1 g 200 mL/hr over 30 Minutes Intravenous Every 24 hours 08/17/20 1414 08/24/20 1209   08/17/20 0615  cefTRIAXone (ROCEPHIN) 1 g in sodium chloride 0.9 % 100 mL IVPB        1 g 200 mL/hr over 30 Minutes  Intravenous  Once 08/17/20 0349 08/17/20 0801      Assessment/Plan: Pulmonary fibrosis/interstitial lung disease Rheumatoid arthritis  - cellcept/Remicade/predinsone Hx CAD/CHF Hx of DVT  Anemia Severe protein-calorie malnutrition- prealbumin 6.0  SBO/Torsion of small bowel mesentary Exploratory laparotomy, detorsion of small bowel, placement of Stamm Gastrostomy tube, 08/23/20, Michaelle Birks  FEN:  NPO/TPN; G-tube to straight drain; awaiting return of bowel function; may have ice chips ID: rocephin 2/7>> day 7 DVT: Lovenox Follow up:  Dr. Zenia Resides Foley:  In with minimal output last shift  Plan:  Stable overall Pain seems to be improved D/C Foley today Continue G-tube to straight drain Mobilize   LOS: 8 days    Maia Petties 08/25/2020

## 2020-08-25 NOTE — Progress Notes (Signed)
PROGRESS NOTE    [name] [name]  GEX:[id number] DOB: [birth year] DOA: 08/17/2020 PCP: Hoyt Koch, MD   Chief Complaint  Patient presents with  . Abdominal Pain  Brief Narrative: 87 year old female with history of chronic hypoxic respiratory failure on 2 L nasal cannula secondary to ILD/pulmonary fibrosis,rheumatoid arthritis on chronic prednisone, CAD/CHF, dyslipidemia, history of DVT, presented with nausea vomiting abdominal pain, onset 3 days prior to admission.Patient seen in the ED 2/7 reported having a bowel movement a week prior to admission in the ED found to have SBO, fecal impaction disimpacted x2 with enema, CCS was consulted for SBO and was admitted for further management. 2/8- started on CLD, but had episode of vomiting on 2/9 - had Xray abd that showed normal bowel gas pattern. She is followed closely by general surgery.  Again had episode of vomiting 2/10 am, surgery planned for enema.Nephew wanted to look into second opinion at [facility]- called  and discussed w/ Transfer center , they are at capacity- was called back by Hospitalist from [facility] and they have declined transfer, nephew made aware of.2/11-GI also consulted, repeat CT abdomen done that showed -progressive small bowel obstruction with significant gastric and small bowel distention, transition point associated with an intraluminal lesion in the mid distal ileum in the mid pelvis, most likely a large gallstone ileus. 2/12: Multiple attempts for NG tube insertion has been failed-by nursing/surgeon as well as IR. 2/13: Patient with ongoing more abdominal pain significant tenderness and some confusion and CCS planning for surgery after discussing with patient and Nephew.  Subjective:  Afebrile overnight. Mentation has significantly improved alert awake. Mild nausea and abdominal discomfort present. Labs showing worsening WBC count.  Assessment & Plan:  SBO/torsion of the small bowel mesentery: s.p exlap,detorsion of  small bowel and placement of gastrostomy tube 2/13 ( Dr Zenia Resides).  Diet n.p.o. Ice chips, ambulation and plan as per CCS. awaiting for return of bowel functions, gastrostomy tube to gravity.  Increase ambulation continue pain control muscle relaxant  Hypotension postop in the setting of third spacing/Anaesthesia/dehydration.  Did not need pressors.  On the stress test steroid hopefully can switch to home dose once po.  On TPN.  Acute on chronic hypoxic respiratory failure on 2 L [place]: Needing more oxygen postoperatively.  3 L nasal cannula.  Continue oxygen supplemental, bronchodilators incentive spirometry increase activity with PT  ILD/pulmonary fibrosis/Rheumatoid arthritis on chronic prednisone, Ofev, CellCept/Remicade outpatient infusion: Unable to take p.o. and IV hydrocortisone stress dose post op.  Rest of the home meds remained on hold.I had communicated to her Pulmonologist Dr Vaughan Browner and okay to hold her ILD meds for now.  Hx of CAD/CHF ( D) /Dyslipidemia: Watch for fluid overload load. Normally she takes Lasix 20 mg as needed for fluid or edema. She has not been taking aspirin and metoprolol at home prior to admission.  UTI on admission UA grossly abnormal UA, repeat UA 2/12-grossly abnormal ( wbc>50,rbc>50, cloudy, LE trace) and urine culture >100,000 Pseudomonas aeruginosa and Enterococcus faecalis.  Seen by ID Dr Juleen China- discontinued antibiotics, watch without antibiotics since not much urinary symptoms.  Leukocytosis persistent, patient received ceftriaxone and comleted. At this time antibiotics on hold as per ID as she has no urinary symptoms. WBC count up today - monitor closely, could be reactive stress-related/from SPR/steroid, has not had any fever.  Monitor closely. Recent Labs  Lab 08/21/20 0613 08/22/20 0634 08/23/20 0613 08/24/20 0408 08/25/20 0807  WBC 14.9* 16.6* 14.3* 14.3* 18.5*    History  of DVT not on anticoagulation due to history of falls.  On prophylactic  Lovenox  Normocytic anemia hemoglobin remains stable. 11-12gm.  Monitor Recent Labs  Lab 08/22/20 0634 08/23/20 0613 08/23/20 1059 08/24/20 0408 08/25/20 0807  HGB 12.2 11.5* 11.2* 10.3* 9.4*  HCT 40.4 36.8 33.0* 34.5* 30.1*   Hypophosphatemia/hypomagnesemia, monitor cont TPN and adjust.    Dehydration/ketosis in the urine, cont TPN, hydration  Goals of care full code.  Multiple comorbidities, with significant pulmonary disease, chronic respiratory failure now with SBO-debilitated.palliative care consultation has been requested to follow along.  Nutrition Status: Severe protein calorie malnourishment present.  Has been on TPN 2/12.  Pharmacy dietitian following. Nutrition Problem: Severe Malnutrition Etiology: chronic illness (ILD/pulmonary fibrosis) Signs/Symptoms: severe fat depletion,severe muscle depletion Interventions: MVI,Boost Plus,Refer to RD note for recommendations  Diet Order            Diet NPO time specified Except for: Ice Chips, Sips with Meds  Diet effective now                DVT prophylaxis: enoxaparin (LOVENOX) injection 30 mg Start: 08/17/20 2200 Code Status:   Code Status: Full Code  Family Communication: plan of care discussed with patient. I have had extensive discussion with patient's nephew previously. I called and updated him 2/14.  Status is Inpatient Remains inpatient appropriate because:IV treatments appropriate due to intensity of illness or inability to take PO and Inpatient level of care appropriate due to severity of illness  Dispo: The patient is from: Home             Anticipated d/c is to: tbd              Anticipated d/c date is: >3 days             Patient currently is not medically stable to d/c.  Difficult to place patient No   Consultants: CCS GI PALLIATIVE MEDICINE PCCM   Procedures: EX LAP 2/13  Culture/Microbiology Other culture-see note  Medications: Scheduled Meds: . bisacodyl  10 mg Rectal Daily  .  chlorhexidine  15 mL Mouth Rinse BID  . Chlorhexidine Gluconate Cloth  6 each Topical Daily  . enoxaparin (LOVENOX) injection  30 mg Subcutaneous Q24H  . hydrocortisone sod succinate (SOLU-CORTEF) inj  100 mg Intravenous Q12H  . insulin aspart  0-15 Units Subcutaneous Q4H  . mouth rinse  15 mL Mouth Rinse q12n4p  . mupirocin ointment  1 application Nasal BID  . sodium chloride flush  10-40 mL Intracatheter Q12H   Continuous Infusions: . sodium chloride 30 mL/hr at 08/25/20 1037  . sodium chloride 10 mL/hr at 08/25/20 1037  . methocarbamol (ROBAXIN) IV Stopped (08/25/20 0540)  . potassium PHOSPHATE IVPB (in mmol) 85 mL/hr at 08/25/20 1036  . TPN ADULT (ION) 60 mL/hr at 08/25/20 1037  . TPN ADULT (ION)      Antimicrobials: Anti-infectives (From admission, onward)   Start     Dose/Rate Route Frequency Ordered Stop   08/23/20 0930  ceFAZolin (ANCEF) IVPB 2g/100 mL premix  Status:  Discontinued       "And" Linked Group Details   2 g 200 mL/hr over 30 Minutes Intravenous On call to O.R. 08/23/20 4098 08/23/20 0841   08/23/20 0930  metroNIDAZOLE (FLAGYL) IVPB 500 mg       "And" Linked Group Details   500 mg 100 mL/hr over 60 Minutes Intravenous On call to O.R. 08/23/20 1191 08/23/20 1027   08/23/20 0923  ceFAZolin (ANCEF)  2-4 GM/100ML-% IVPB  Status:  Discontinued       Note to Pharmacy: Enrigue Catena   : cabinet override      08/23/20 0923 08/23/20 0938   08/19/20 1000  dapsone tablet 100 mg  Status:  Discontinued       Note to Pharmacy: TAKE 1 TABLET(100 MG) BY MOUTH DAILY     100 mg Oral Daily 08/18/20 1350 08/22/20 0805   08/18/20 1000  cefTRIAXone (ROCEPHIN) 1 g in sodium chloride 0.9 % 100 mL IVPB  Status:  Discontinued        1 g 200 mL/hr over 30 Minutes Intravenous Every 24 hours 08/17/20 1414 08/24/20 1209   08/17/20 0615  cefTRIAXone (ROCEPHIN) 1 g in sodium chloride 0.9 % 100 mL IVPB        1 g 200 mL/hr over 30 Minutes Intravenous  Once 08/17/20 0605 08/17/20 0801      Objective: Vitals: Today's Vitals   08/25/20 0700 08/25/20 0745 08/25/20 0800 08/25/20 0811  BP: 116/77  (!) 102/48   Pulse: 84  74   Resp: (!) 27  (!) 25   Temp:   98.4 F (36.9 C)   TempSrc:   Oral   SpO2: 93%  93%   Weight:      Height:      PainSc:  9   7     Intake/Output Summary (Last 24 hours) at 08/25/2020 1120 Last data filed at 08/25/2020 1037 Gross per 24 hour  Intake 2736.08 ml  Output 1005 ml  Net 1731.08 ml   Filed Weights   08/17/20 0326  Weight: 44.5 kg   Weight change:   Intake/Output from previous day: 02/14 0701 - 02/15 0700 In: 3393.2 [I.V.:2714.9; IV Piggyback:678.3] Out: 6606 [Urine:1165; Drains:75] Intake/Output this shift: Total I/O In: 495.4 [I.V.:394.7; IV Piggyback:100.6] Out: 115 [Urine:115] Filed Weights   08/17/20 0326  Weight: 44.5 kg   Examination:  General exam: AAOx3, ill looking thin ,frail , NAD, weak appearing. HEENT:Oral mucosa moist, Ear/Nose WNL grossly, dentition normal. Respiratory system: bilaterally clear with crackles on post lungs Cardiovascular system: S1 & S2 +, No JVD,. Gastrointestinal system: Abdomen soft, G tube+, mildly tender,BS sluggish Nervous System:Alert, awake, moving extremities and grossly nonfocal Extremities: No edema, distal peripheral pulses palpable.  Skin: No rashes,no icterus. MSK: thin muscle bulk,tone, power Data Reviewed: I have personally reviewed following labs and imaging studies CBC: Recent Labs  Lab 08/21/20 0613 08/22/20 0634 08/23/20 0613 08/23/20 1059 08/24/20 0408 08/25/20 0807  WBC 14.9* 16.6* 14.3*  --  14.3* 18.5*  NEUTROABS  --  12.5*  --   --  11.8*  --   HGB 12.1 12.2 11.5* 11.2* 10.3* 9.4*  HCT 39.4 40.4 36.8 33.0* 34.5* 30.1*  MCV 99.2 101.0* 99.2  --  104.9* 99.7  PLT 303 262 201  --  157 301*   Basic Metabolic Panel: Recent Labs  Lab 08/21/20 0613 08/22/20 0634 08/23/20 0613 08/23/20 1059 08/24/20 0408 08/25/20 0231  NA 143 146* 145 147* 141 137  K  3.7 3.8 3.5 3.5 4.1 4.2  CL 103 104 109  --  108 104  CO2 25 27 26   --  25 26  GLUCOSE 82 80 216*  --  187* 159*  BUN 25* 24* 23  --  16 14  CREATININE 0.76 0.80 0.71  --  0.46 0.43*  CALCIUM 9.2 9.5 9.0  --  7.8* 8.2*  MG  --  1.9 1.8  --  1.9  1.6*  PHOS  --  1.7* 1.7*  --  2.3* 2.1*   GFR: Estimated Creatinine Clearance: 34.8 mL/min (A) (by C-G formula based on SCr of 0.43 mg/dL (L)). Liver Function Tests: Recent Labs  Lab 08/21/20 0613 08/22/20 0634 08/23/20 0613 08/24/20 0408 08/25/20 0231  AST 17 17 20 18 15   ALT 12 12 11 10 10   ALKPHOS 62 66 55 44 45  BILITOT 1.3* 1.0 0.7 0.6 0.6  PROT 7.0 6.8 5.9* 5.1* 4.9*  ALBUMIN 3.2* 3.3* 2.8* 2.4* 2.2*   No results for input(s): LIPASE, AMYLASE in the last 168 hours. No results for input(s): AMMONIA in the last 168 hours. Coagulation Profile: No results for input(s): INR, PROTIME in the last 168 hours. Cardiac Enzymes: No results for input(s): CKTOTAL, CKMB, CKMBINDEX, TROPONINI in the last 168 hours. BNP (last 3 results) Recent Labs    06/03/20 1001  PROBNP 100   HbA1C: No results for input(s): HGBA1C in the last 72 hours. CBG: Recent Labs  Lab 08/24/20 1205 08/24/20 1707 08/24/20 2301 08/25/20 0508 08/25/20 0756  GLUCAP 184* 181* 172* 163* 139*   Lipid Profile: Recent Labs    08/24/20 0408  TRIG 39   Thyroid Function Tests: No results for input(s): TSH, T4TOTAL, FREET4, T3FREE, THYROIDAB in the last 72 hours. Anemia Panel: No results for input(s): VITAMINB12, FOLATE, FERRITIN, TIBC, IRON, RETICCTPCT in the last 72 hours. Sepsis Labs: No results for input(s): PROCALCITON, LATICACIDVEN in the last 168 hours.  Recent Results (from the past 240 hour(s))  SARS CORONAVIRUS 2 (TAT 6-24 HRS) Nasopharyngeal Nasopharyngeal Swab     Status: None   Collection Time: 08/17/20 12:28 PM   Specimen: Nasopharyngeal Swab  Result Value Ref Range Status   SARS Coronavirus 2 NEGATIVE NEGATIVE Final    Comment:  (NOTE) SARS-CoV-2 target nucleic acids are NOT DETECTED.  The SARS-CoV-2 RNA is generally detectable in upper and lower respiratory specimens during the acute phase of infection. Negative results do not preclude SARS-CoV-2 infection, do not rule out co-infections with other pathogens, and should not be used as the sole basis for treatment or other patient management decisions. Negative results must be combined with clinical observations, patient history, and epidemiological information. The expected result is Negative.  Fact Sheet for Patients: SugarRoll.be  Fact Sheet for Healthcare Providers: https://www.woods-mathews.com/  This test is not yet approved or cleared by the [place] FDA and  has been authorized for detection and/or diagnosis of SARS-CoV-2 by FDA under an Emergency Use Authorization (EUA). This EUA will remain  in effect (meaning this test can be used) for the duration of the COVID-19 declaration under Se ction 564(b)(1) of the Act, 21 U.S.C. section 360bbb-3(b)(1), unless the authorization is terminated or revoked sooner.  Performed at [facility], [place] [address], [place], [place] [address]   Culture, Urine     Status: Abnormal   Collection Time: 08/21/20  5:40 PM   Specimen: Urine, Clean Catch  Result Value Ref Range Status   Specimen Description   Final    URINE, CLEAN CATCH Performed at [facility], [place] [address] [place], [place] [address]    Special Requests   Final    NONE Performed at [facility], [place] [address], [place], [place] [address]    Culture (A)  Final    >=100,000 COLONIES/mL PSEUDOMONAS AERUGINOSA >=100,000 COLONIES/mL ENTEROCOCCUS FAECALIS    Report Status 08/25/2020 FINAL  Final   Organism ID, Bacteria PSEUDOMONAS AERUGINOSA (A)  Final   Organism ID, Bacteria ENTEROCOCCUS FAECALIS (A)  Final      Susceptibility   Enterococcus faecalis - MIC*     AMPICILLIN <=2 SENSITIVE Sensitive     NITROFURANTOIN <=16 SENSITIVE Sensitive     VANCOMYCIN 1 SENSITIVE Sensitive     * >=100,000 COLONIES/mL ENTEROCOCCUS FAECALIS   Pseudomonas aeruginosa - MIC*    CEFTAZIDIME 4 SENSITIVE Sensitive     CIPROFLOXACIN <=0.25 SENSITIVE Sensitive     GENTAMICIN 2 SENSITIVE Sensitive     IMIPENEM 1 SENSITIVE Sensitive     PIP/TAZO <=4 SENSITIVE Sensitive     * >=100,000 COLONIES/mL PSEUDOMONAS AERUGINOSA  MRSA PCR Screening     Status: Abnormal   Collection Time: 08/23/20  1:27 PM   Specimen: Nasal Mucosa; Nasopharyngeal  Result Value Ref Range Status   MRSA by PCR POSITIVE (A) NEGATIVE Final    Comment:        The GeneXpert MRSA Assay (FDA approved for NASAL specimens only), is one component of a comprehensive MRSA colonization surveillance program. It is not intended to diagnose MRSA infection nor to guide or monitor treatment for MRSA infections. RESULT CALLED TO, READ BACK BY AND VERIFIED WITH: CARTER,K RN @1459  ON 08/23/20 JACKSON,K Performed at [facility], [place] [address], [place], [place] [address]      Radiology Studies: [facility] Chest Port 1 View  Result Date: 08/24/2020 CLINICAL DATA:  Acute respiratory failure with hypoxemia EXAM: PORTABLE CHEST 1 VIEW COMPARISON:  02/12/2020 FINDINGS: Pulmonary fibrosis by chest CT. The pattern is similar to prior no convincing change in density. Right PICC with tip at the upper right atrium. No effusion or pneumothorax. Stable borderline heart size IMPRESSION: Pulmonary fibrosis with similar pattern to 2021. No acute superimposed disease is seen, but early pneumonia could easily be obscured given the extent chronic changes. Electronically Signed   By: Monte Fantasia M.D.   On: 08/24/2020 06:10     LOS: 8 days   Antonieta Pert, MD [facility]  08/25/2020, 11:20 AM

## 2020-08-25 NOTE — Progress Notes (Signed)
PHARMACY - TOTAL PARENTERAL NUTRITION CONSULT NOTE   Indication: Small bowel obstruction  Patient Measurements: Height: 5' 4.5" (163.8 cm) Weight: 44.5 kg (98 lb) IBW/kg (Calculated) : 55.85 TPN AdjBW (KG): 44.5 Body mass index is 16.56 kg/m.  Assessment: 87 yo female with persistent SBO that may be result of gallstone ileus. Multiple attempts at NGT have failed; per CCS, at this time no further options besides doing surgery although high risk. She is now s/p exploratory laparotomy, detorsion of small bowel, placement of Stamm gastrostomy tube on 08/23/20.  Glucose / Insulin: CBG goal 100-150; rage 139-184 - note patient on chronic steroids currently on solucortef 100mg  q12 - 10 units SSI/24hr required Electrolytes: WNL except phos remains low and decreased despite despite IV replacement 2/12, 2/13, 2/14; Mg 1.6; Corrected Ca WNL at 9.64 Renal: Scr 0.43 stable LFTs / TGs: LFTs WNL, TG 39 Prealbumin / albumin: prealb 6.0, alb 2.2 Intake / Output: net +2153, UOP 1131ml MIVF: 1/2 NS at 30 ml/hr GI Imaging: 2/11 SBO per CT Surgeries / Procedures: 2/13 ex lap, detorsion of small bowel, placement of G- tube  Central access: 2/12 - PICC TPN start date: 2/12  Nutritional Goals (per RD recommendation on 2/11): Severe malnutrition kCal: 1500-1700, Protein: 70-85, Fluid: >= 1.4L  Note that currently there is a Lipid emulsion shortage so SMOFlipid will only be added on MWF for appropriate patients  Goal TPN rate MWF with lipid is 60 mL/hr (provides 72 g of protein and 1699 kcals per day)  Goal TPN rate TuThSatSun without lipid is 60 ml/hr (provides 72g protein and 1267 kcal)   Current Nutrition:  NPO except ice chips, sips with meds TPN at 60 ml/hr  Plan:  Now: Kphos 30 mMol  IV x 1, Mag sulfate 4g IV x 1  Continue TPN at goal rate 60 ml/hr - no lipids today due to national shortage Electrolytes in TPN:   54mEq/L of Na  72mEq/L of K   56mEq/L of Ca,   Increase 40mEq/L of  Mg  Increase 65mmol/L of Phos  Cl:Ac ratio 1:1 Add standard MVI and trace elements to TPN Increase moderate q4h SSI and adjust as needed  Pepcid 20mg  per day in TPN Continue MIVF per CCS - 1/2 NS 30 ml/mhr Monitor TPN labs on Mon/Thurs and as needed  Peggyann Juba, PharmD, BCPS Pharmacy: [phone] 08/25/2020,7:37 AM

## 2020-08-26 DIAGNOSIS — K56609 Unspecified intestinal obstruction, unspecified as to partial versus complete obstruction: Secondary | ICD-10-CM | POA: Diagnosis not present

## 2020-08-26 LAB — GLUCOSE, CAPILLARY
Glucose-Capillary: 135 mg/dL — ABNORMAL HIGH (ref 70–99)
Glucose-Capillary: 137 mg/dL — ABNORMAL HIGH (ref 70–99)
Glucose-Capillary: 141 mg/dL — ABNORMAL HIGH (ref 70–99)
Glucose-Capillary: 143 mg/dL — ABNORMAL HIGH (ref 70–99)
Glucose-Capillary: 155 mg/dL — ABNORMAL HIGH (ref 70–99)
Glucose-Capillary: 157 mg/dL — ABNORMAL HIGH (ref 70–99)

## 2020-08-26 LAB — CBC
HCT: 32.5 % — ABNORMAL LOW (ref 36.0–46.0)
Hemoglobin: 10.1 g/dL — ABNORMAL LOW (ref 12.0–15.0)
MCH: 30.7 pg (ref 26.0–34.0)
MCHC: 31.1 g/dL (ref 30.0–36.0)
MCV: 98.8 fL (ref 80.0–100.0)
Platelets: 160 10*3/uL (ref 150–400)
RBC: 3.29 MIL/uL — ABNORMAL LOW (ref 3.87–5.11)
RDW: 14.3 % (ref 11.5–15.5)
WBC: 21.7 10*3/uL — ABNORMAL HIGH (ref 4.0–10.5)
nRBC: 0 % (ref 0.0–0.2)

## 2020-08-26 LAB — COMPREHENSIVE METABOLIC PANEL
ALT: 12 U/L (ref 0–44)
AST: 16 U/L (ref 15–41)
Albumin: 2.2 g/dL — ABNORMAL LOW (ref 3.5–5.0)
Alkaline Phosphatase: 51 U/L (ref 38–126)
Anion gap: 8 (ref 5–15)
BUN: 14 mg/dL (ref 8–23)
CO2: 28 mmol/L (ref 22–32)
Calcium: 8.1 mg/dL — ABNORMAL LOW (ref 8.9–10.3)
Chloride: 102 mmol/L (ref 98–111)
Creatinine, Ser: 0.44 mg/dL (ref 0.44–1.00)
GFR, Estimated: >60 mL/min (ref >60)
Glucose, Bld: 143 mg/dL — ABNORMAL HIGH (ref 70–99)
Potassium: 3.7 mmol/L (ref 3.5–5.1)
Sodium: 138 mmol/L (ref 135–145)
Total Bilirubin: 0.6 mg/dL (ref 0.3–1.2)
Total Protein: 5 g/dL — ABNORMAL LOW (ref 6.5–8.1)

## 2020-08-26 LAB — PHOSPHORUS: Phosphorus: 2.7 mg/dL (ref 2.5–4.6)

## 2020-08-26 LAB — PROCALCITONIN: Procalcitonin: 0.19 ng/mL

## 2020-08-26 LAB — MAGNESIUM: Magnesium: 2 mg/dL (ref 1.7–2.4)

## 2020-08-26 MED ORDER — TRAVASOL 10 % IV SOLN
INTRAVENOUS | Status: AC
  Filled 2020-08-26: qty 720

## 2020-08-26 MED ORDER — HYDROCORTISONE NA SUCCINATE PF 100 MG IJ SOLR: 25.0000 mg | Freq: Two times a day (BID) | INTRAMUSCULAR | Status: DC

## 2020-08-26 MED ORDER — ACETAMINOPHEN 10 MG/ML IV SOLN
1000.0000 mg | Freq: Four times a day (QID) | INTRAVENOUS | Status: AC
  Administered 2020-08-26 – 2020-08-27 (×4): 1000 mg via INTRAVENOUS
  Filled 2020-08-26 (×4): qty 100

## 2020-08-26 MED ORDER — HYDROCORTISONE NA SUCCINATE PF 100 MG IJ SOLR
100.0000 mg | Freq: Every day | INTRAMUSCULAR | Status: DC
  Administered 2020-08-26 – 2020-08-27 (×2): 100 mg via INTRAVENOUS
  Filled 2020-08-26 (×3): qty 2

## 2020-08-26 NOTE — Progress Notes (Signed)
Physical Therapy Treatment Patient Details Name: [name] [name] MRN: [id number] DOB: Nov 08, 1930 Today's Date: 08/26/2020    History of Present Illness 87 yo female admitted with SBO, UTI,   S/P Exploratory laparotomy, detorsion of small bowel, placement of Stamm Gastrostomy tube, 08/23/20. Hx of falls, pulm fibrosis, chronic respiratory failure-O2 dep 2L, DVT, osteoporosis, RA, DJD, CAD, CHF    PT Comments    The patient is much more alert, talkative and ambulated x 15' x 2 with RW. Initially ambulated on RA x 15', noted SOB, placed on 2 L with SPO2 94%. Patient on home O2 a 2 L. HR 100. Indicates abdominal pain with mobility but none at rest.  Discussed with patient that she may benefit from post acute rehab. Patient reports her sister in law may be available. Continue to progress mobility/Dc planning.  Follow Up Recommendations  SNFunless has 24 /7 caregivers.     Equipment Recommendations  None recommended by PT    Recommendations for Other Services       Precautions / Restrictions Precautions Precautions: Fall Precaution Comments: O2 dep,  gastrostomy tube drain, Required Braces or Orthoses: Other Brace Other Brace: abdominal binder-OOPs did not place it today    Mobility  Bed Mobility Overal bed mobility: Needs Assistance Bed Mobility: Supine to Sit Rolling: Min assist Sidelying to sit: Min assist     Sit to sidelying: Mod assist General bed mobility comments: cues for precautions of Abdomen    Transfers   Equipment used: Rolling walker (2 wheeled) Transfers: Sit to/from Stand Sit to Stand: Min assist;Mod assist         General transfer comment: min from bed, mod from toilet.  Ambulation/Gait Ambulation/Gait assistance: Min assist;+2 safety/equipment Gait Distance (Feet): 15 Feet (x 2) Assistive device: Rolling walker (2 wheeled) Gait Pattern/deviations: Decreased stride length     General Gait Details: steady assist , slow gait. Cues for position inside  RW.   Stairs             Wheelchair Mobility    Modified Rankin (Stroke Patients Only)       Balance Overall balance assessment: Needs assistance Sitting-balance support: Feet supported;Bilateral upper extremity supported Sitting balance-[name]: Fair     Standing balance support: Bilateral upper extremity supported Standing balance-[name]: Fair                              Cognition Arousal/Alertness: Awake/alert Behavior During Therapy: WFL for tasks assessed/performed Overall Cognitive Status: Within Functional Limits for tasks assessed                                        Exercises      General Comments        Pertinent Vitals/Pain Faces Pain Scale: Hurts even more Pain Location: abdomen Pain Descriptors / Indicators: Discomfort;Grimacing;Guarding Pain Intervention(s): Monitored during session;Patient requesting pain meds-RN notified    Home Living                      Prior Function            PT Goals (current goals can now be found in the care plan section) Progress towards PT goals: Progressing toward goals    Frequency    Min 2X/week      PT Plan Current plan remains appropriate;Frequency  needs to be updated;Discharge plan needs to be updated    Co-evaluation              AM-PAC PT "6 Clicks" Mobility   Outcome Measure  Help needed turning from your back to your side while in a flat bed without using bedrails?: A Little Help needed moving from lying on your back to sitting on the side of a flat bed without using bedrails?: A Little Help needed moving to and from a bed to a chair (including a wheelchair)?: A Little Help needed standing up from a chair using your arms (e.g., wheelchair or bedside chair)?: A Little Help needed to walk in hospital room?: A Lot Help needed climbing 3-5 steps with a railing? : A Lot 6 Click Score: 16    End of Session Equipment Utilized During  Treatment: Oxygen Activity Tolerance: Patient tolerated treatment well Patient left: in chair;with call bell/phone within reach;with chair alarm set Nurse Communication: Mobility status PT Visit Diagnosis: Muscle weakness (generalized) (M62.81);Unsteadiness on feet (R26.81)     Time: 6195-0932 PT Time Calculation (min) (ACUTE ONLY): 45 min  Charges:  $Gait Training: 8-22 mins                     Onycha Pager [phone] Office [phone]    Claretha Cooper 08/26/2020, 1:32 PM

## 2020-08-26 NOTE — Plan of Care (Signed)

## 2020-08-26 NOTE — Progress Notes (Signed)
PROGRESS NOTE    [name] [name]  OVZ:[id number] DOB: 1931/02/04 DOA: 08/17/2020 PCP: Hoyt Koch, MD   Chief Complaint  Patient presents with  . Abdominal Pain  Brief Narrative: 86 year old female with history of chronic hypoxic respiratory failure on 2 L nasal cannula secondary to ILD/pulmonary fibrosis,rheumatoid arthritis on chronic prednisone, CAD/CHF, dyslipidemia, history of DVT, presented with nausea vomiting abdominal pain, onset 3 days prior to admission.Patient seen in the ED 2/7 reported having a bowel movement a week prior to admission in the ED found to have SBO, fecal impaction disimpacted x2 with enema, CCS was consulted for SBO and was admitted for further management. 2/8- started on CLD, but had episode of vomiting on 2/9 - had Xray abd that showed normal bowel gas pattern. She is followed closely by general surgery.  Again had episode of vomiting 2/10 am, surgery planned for enema.Nephew wanted to look into second opinion at [facility]- called  and discussed w/ Transfer center , they are at capacity- was called back by Hospitalist from [facility] and they have declined transfer, nephew made aware of.2/11-GI also consulted, repeat CT abdomen done that showed -progressive small bowel obstruction with significant gastric and small bowel distention, transition point associated with an intraluminal lesion in the mid distal ileum in the mid pelvis, most likely a large gallstone ileus. 2/12: Multiple attempts for NG tube insertion has been failed-by nursing/surgeon as well as IR. 2/13: Patient with ongoing more abdominal pain significant tenderness and some confusion- s/p ex lap with detorsion of small intestine and transferred to ICU for postop ICU care  Subjective:  Patient is alert awake oriented, not in acute distress.  No BM or flatus.  Leukocytosis further trending up but afebrile.  Denies headache chills fever. G-tube to gravity, off Foley.  Assessment & Plan:  SBO/torsion of the  small bowel mesentery: s/p exlap,detorsion of small bowel and placement of gastrostomy tube 2/13 ( Dr Zenia Resides).  Continue diet n.p.o. as per CCS, encourage ambulation, PT OT.  Continue gastrostomy tube drainage to gravity.  Continue pain control muscle relaxant.   Hypotension postop in the setting of third spacing/Anaesthesia/dehydration.  It has resolved.  Did not need pressors.  Will wean her hydrocortisone to 1oo mg TODAY,if blood pressure is stable tomorrow can change to home dose. Cont TPN.  Acute on chronic hypoxic respiratory failure on 2 L [place]: Needing more oxygen postoperatively.  3 L nasal cannula.  Continue to maintain SPO2 at least 94%.  Encourage incentive spirometry, ambulation PT OT.   ILD/pulmonary fibrosis/Rheumatoid arthritis on chronic prednisone, Ofev, CellCept/Remicade outpatient infusion: Unable to take p.o. continue on IV hydrocortisone decreased to 25mg  BID.I had communicated to her Pulmonologist Dr Vaughan Browner and okay to hold her ILD meds for now.  Hx of CAD/CHF ( D) /Dyslipidemia: No evidence of fluid retention.Normally she takes Lasix 20 mg as needed for fluid or edema. She has not been taking aspirin and metoprolol at home prior to admission.  UTI on admission UA grossly abnormal UA, repeat UA 2/12-grossly abnormal ( wbc>50,rbc>50, cloudy, LE trace) and urine culture >100,000 Pseudomonas aeruginosa and Enterococcus faecalis.  Seen by ID Dr Juleen China- discontinued antibiotics.  Patient denies any dysuria urinary frequency fever.    Leukocytosis persistent, patient received ceftriaxone and comleted.  Antibiotics were discontinued as per ID.  Leukocytosis are trending up unclear if from postop/stress or from steroid.  Discussed with PCCM-start wean down on a steroid, check procalcitonin, UA. Recent Labs  Lab 08/22/20 [phone] 08/23/20 [phone] 08/24/20 0408  08/25/20 0807 08/26/20 0244  WBC 16.6* 14.3* 14.3* 18.5* 21.7*   History of DVT not on anticoagulation due to history of falls.  On  prophylactic Lovenox.  Normocytic anemia hemoglobin remains stable. 11-12gm baseline.  Monitor Recent Labs  Lab 08/23/20 0613 08/23/20 1059 08/24/20 0408 08/25/20 0807 08/26/20 0244  HGB 11.5* 11.2* 10.3* 9.4* 10.1*  HCT 36.8 33.0* 34.5* 30.1* 32.5*   Hypophosphatemia/hypomagnesemia, monitor electrolytes continue TPN  Dehydration/ketosis in the urine, cont TPN.  Goals of care full code.Multiple comorbidities,with significant pulmonary disease,chronic respiratory failure now with SBO-debilitated.palliative care consultation has been requested to follow along.  Nutrition Status: Severe protein calorie malnourishment present.  Has been on TPN 2/12.  Pharmacy dietitian following. Nutrition Problem: Severe Malnutrition Etiology: chronic illness (ILD/pulmonary fibrosis) Signs/Symptoms: severe fat depletion,severe muscle depletion Interventions: MVI,Boost Plus,Refer to RD note for recommendations  Diet Order            Diet NPO time specified Except for: Ice Chips, Sips with Meds  Diet effective now                DVT prophylaxis: enoxaparin (LOVENOX) injection 30 mg Start: 08/17/20 2200 Code Status:   Code Status: Full Code  Family Communication: plan of care discussed with patient. I have had extensive discussion with patient's nephew previously. Again updated 2/16. Discussed with the patient in detail this morning she reports she has been talking with her nephew. Discussed with critical care and surgery Will transfer to stepdown  Status is Inpatient Remains inpatient appropriate because:IV treatments appropriate due to intensity of illness or inability to take PO and Inpatient level of care appropriate due to severity of illness.  Dispo:The patient is from: Home            Anticipated d/c is to: tbd             Anticipated d/c date is: >3 days            Patient currently is not medically stable to d/c.            Difficult to place patient No    Consultants: CCS GI PALLIATIVE MEDICINE PCCM   Procedures: EX LAP 2/13  Culture/Microbiology Other culture-see note  Medications: Scheduled Meds: . bisacodyl  10 mg Rectal Daily  . chlorhexidine  15 mL Mouth Rinse BID  . Chlorhexidine Gluconate Cloth  6 each Topical Daily  . enoxaparin (LOVENOX) injection  30 mg Subcutaneous Q24H  . hydrocortisone sod succinate (SOLU-CORTEF) inj  100 mg Intravenous Q12H  . insulin aspart  0-15 Units Subcutaneous Q4H  . mouth rinse  15 mL Mouth Rinse q12n4p  . mupirocin ointment  1 application Nasal BID  . sodium chloride flush  10-40 mL Intracatheter Q12H   Continuous Infusions: . sodium chloride 30 mL/hr at 08/25/20 1821  . sodium chloride 10 mL/hr at 08/25/20 1821  . acetaminophen    . methocarbamol (ROBAXIN) IV 500 mg (08/26/20 0500)  . TPN ADULT (ION) 60 mL/hr at 08/25/20 1821    Antimicrobials: Anti-infectives (From admission, onward)   Start     Dose/Rate Route Frequency Ordered Stop   08/23/20 0930  ceFAZolin (ANCEF) IVPB 2g/100 mL premix  Status:  Discontinued       "And" Linked Group Details   2 g 200 mL/hr over 30 Minutes Intravenous On call to O.R. 08/23/20 3536 08/23/20 0841   08/23/20 0930  metroNIDAZOLE (FLAGYL) IVPB 500 mg       "And" Linked Group Details  500 mg 100 mL/hr over 60 Minutes Intravenous On call to O.R. 08/23/20 0841 08/23/20 1027   08/23/20 0923  ceFAZolin (ANCEF) 2-4 GM/100ML-% IVPB  Status:  Discontinued       Note to Pharmacy: Enrigue Catena   : cabinet override      08/23/20 0923 08/23/20 0938   08/19/20 1000  dapsone tablet 100 mg  Status:  Discontinued       Note to Pharmacy: TAKE 1 TABLET(100 MG) BY MOUTH DAILY     100 mg Oral Daily 08/18/20 1350 08/22/20 0805   08/18/20 1000  cefTRIAXone (ROCEPHIN) 1 g in sodium chloride 0.9 % 100 mL IVPB  Status:  Discontinued        1 g 200 mL/hr over 30 Minutes Intravenous Every 24 hours 08/17/20 1414 08/24/20 1209   08/17/20 0615  cefTRIAXone (ROCEPHIN) 1  g in sodium chloride 0.9 % 100 mL IVPB        1 g 200 mL/hr over 30 Minutes Intravenous  Once 08/17/20 0605 08/17/20 0801     Objective: Vitals: Today's Vitals   08/26/20 0322 08/26/20 0400 08/26/20 0500 08/26/20 0600  BP:  139/68 (!) 106/52 (!) 122/52  Pulse:  86 72 74  Resp:  (!) 21 (!) 22 (!) 21  Temp: 98 F (36.7 C)     TempSrc: Oral     SpO2:  97% 91% 94%  Weight:      Height:      PainSc:        Intake/Output Summary (Last 24 hours) at 08/26/2020 0747 Last data filed at 08/26/2020 0600 Gross per 24 hour  Intake 2832.64 ml  Output 1715 ml  Net 1117.64 ml   Filed Weights   08/17/20 0326  Weight: 44.5 kg   Weight change:   Intake/Output from previous day: 02/15 0701 - 02/16 0700 In: 2832.6 [I.V.:2112.8; IV Piggyback:719.9] Out: 5809 [Urine:1715] Intake/Output this shift: No intake/output data recorded. Filed Weights   08/17/20 0326  Weight: 44.5 kg   Examination: General exam: AAOx3, elderly,frail, on [place] HEENT:Oral mucosa moist, Ear/Nose WNL grossly, dentition normal. Respiratory system: bilaterally crackles posterior lung unchanged,no use of accessory muscle Cardiovascular system: S1 & S2 +, No JVD,. Gastrointestinal system: Abdomen soft,mildly tender-surgical site with intact dressing and stable.  Bowel sounds absent. G tube present Nervous System:Alert, awake, moving extremities and grossly nonfocal Extremities: No edema, distal peripheral pulses palpable.  Skin: No rashes,no icterus. MSK: Normal muscle bulk,tone, power  Data Reviewed: I have personally reviewed following labs and imaging studies CBC: Recent Labs  Lab 08/22/20 0634 08/23/20 0613 08/23/20 1059 08/24/20 0408 08/25/20 0807 08/26/20 0244  WBC 16.6* 14.3*  --  14.3* 18.5* 21.7*  NEUTROABS 12.5*  --   --  11.8*  --   --   HGB 12.2 11.5* 11.2* 10.3* 9.4* 10.1*  HCT 40.4 36.8 33.0* 34.5* 30.1* 32.5*  MCV 101.0* 99.2  --  104.9* 99.7 98.8  PLT 262 201  --  157 145* 983   Basic  Metabolic Panel: Recent Labs  Lab 08/22/20 0634 08/23/20 0613 08/23/20 1059 08/24/20 0408 08/25/20 0231 08/26/20 0244  NA 146* 145 147* 141 137 138  K 3.8 3.5 3.5 4.1 4.2 3.7  CL 104 109  --  108 104 102  CO2 27 26  --  25 26 28   GLUCOSE 80 216*  --  187* 159* 143*  BUN 24* 23  --  16 14 14   CREATININE 0.80 0.71  --  0.46 0.43*  0.44  CALCIUM 9.5 9.0  --  7.8* 8.2* 8.1*  MG 1.9 1.8  --  1.9 1.6* 2.0  PHOS 1.7* 1.7*  --  2.3* 2.1* 2.7   GFR: Estimated Creatinine Clearance: 34.8 mL/min (by C-G formula based on SCr of 0.44 mg/dL). Liver Function Tests: Recent Labs  Lab 08/22/20 0634 08/23/20 0613 08/24/20 0408 08/25/20 0231 08/26/20 0244  AST 17 20 18 15 16   ALT 12 11 10 10 12   ALKPHOS 66 55 44 45 51  BILITOT 1.0 0.7 0.6 0.6 0.6  PROT 6.8 5.9* 5.1* 4.9* 5.0*  ALBUMIN 3.3* 2.8* 2.4* 2.2* 2.2*   No results for input(s): LIPASE, AMYLASE in the last 168 hours. No results for input(s): AMMONIA in the last 168 hours. Coagulation Profile: No results for input(s): INR, PROTIME in the last 168 hours. Cardiac Enzymes: No results for input(s): CKTOTAL, CKMB, CKMBINDEX, TROPONINI in the last 168 hours. BNP (last 3 results) Recent Labs    06/03/20 1001  PROBNP 100   HbA1C: No results for input(s): HGBA1C in the last 72 hours. CBG: Recent Labs  Lab 08/25/20 1149 08/25/20 1555 08/25/20 1950 08/25/20 2332 08/26/20 0320  GLUCAP 169* 176* 131* 143* 155*   Lipid Profile: Recent Labs    08/24/20 0408  TRIG 39   Thyroid Function Tests: No results for input(s): TSH, T4TOTAL, FREET4, T3FREE, THYROIDAB in the last 72 hours. Anemia Panel: No results for input(s): VITAMINB12, FOLATE, FERRITIN, TIBC, IRON, RETICCTPCT in the last 72 hours. Sepsis Labs: No results for input(s): PROCALCITON, LATICACIDVEN in the last 168 hours.  Recent Results (from the past 240 hour(s))  SARS CORONAVIRUS 2 (TAT 6-24 HRS) Nasopharyngeal Nasopharyngeal Swab     Status: None   Collection Time:  08/17/20 12:28 PM   Specimen: Nasopharyngeal Swab  Result Value Ref Range Status   SARS Coronavirus 2 NEGATIVE NEGATIVE Final    Comment: (NOTE) SARS-CoV-2 target nucleic acids are NOT DETECTED.  The SARS-CoV-2 RNA is generally detectable in upper and lower respiratory specimens during the acute phase of infection. Negative results do not preclude SARS-CoV-2 infection, do not rule out co-infections with other pathogens, and should not be used as the sole basis for treatment or other patient management decisions. Negative results must be combined with clinical observations, patient history, and epidemiological information. The expected result is Negative.  Fact Sheet for Patients: SugarRoll.be  Fact Sheet for Healthcare Providers: https://www.woods-mathews.com/  This test is not yet approved or cleared by the [place] FDA and  has been authorized for detection and/or diagnosis of SARS-CoV-2 by FDA under an Emergency Use Authorization (EUA). This EUA will remain  in effect (meaning this test can be used) for the duration of the COVID-19 declaration under Se ction 564(b)(1) of the Act, 21 U.S.C. section 360bbb-3(b)(1), unless the authorization is terminated or revoked sooner.  Performed at [facility], [place] [address] [place], [place] [address]   Culture, Urine     Status: Abnormal   Collection Time: 08/21/20  5:40 PM   Specimen: Urine, Clean Catch  Result Value Ref Range Status   Specimen Description   Final    URINE, CLEAN CATCH Performed at [facility], [place] [address] [place], [place] [address]    Special Requests   Final    NONE Performed at [facility], [place] [address] [place], [place] [address]    Culture (A)  Final    >=100,000 COLONIES/mL PSEUDOMONAS AERUGINOSA >=100,000 COLONIES/mL ENTEROCOCCUS FAECALIS  Report Status 08/25/2020 FINAL  Final   Organism ID, Bacteria  PSEUDOMONAS AERUGINOSA (A)  Final   Organism ID, Bacteria ENTEROCOCCUS FAECALIS (A)  Final      Susceptibility   Enterococcus faecalis - MIC*    AMPICILLIN <=2 SENSITIVE Sensitive     NITROFURANTOIN <=16 SENSITIVE Sensitive     VANCOMYCIN 1 SENSITIVE Sensitive     * >=100,000 COLONIES/mL ENTEROCOCCUS FAECALIS   Pseudomonas aeruginosa - MIC*    CEFTAZIDIME 4 SENSITIVE Sensitive     CIPROFLOXACIN <=0.25 SENSITIVE Sensitive     GENTAMICIN 2 SENSITIVE Sensitive     IMIPENEM 1 SENSITIVE Sensitive     PIP/TAZO <=4 SENSITIVE Sensitive     * >=100,000 COLONIES/mL PSEUDOMONAS AERUGINOSA  MRSA PCR Screening     Status: Abnormal   Collection Time: 08/23/20  1:27 PM   Specimen: Nasal Mucosa; Nasopharyngeal  Result Value Ref Range Status   MRSA by PCR POSITIVE (A) NEGATIVE Final    Comment:        The GeneXpert MRSA Assay (FDA approved for NASAL specimens only), is one component of a comprehensive MRSA colonization surveillance program. It is not intended to diagnose MRSA infection nor to guide or monitor treatment for MRSA infections. RESULT CALLED TO, READ BACK BY AND VERIFIED WITH: CARTER,K RN @1459  ON 08/23/20 JACKSON,K Performed at [facility], [place] [address], [place], [place] [address]      Radiology Studies: No results found.   LOS: 9 days   Antonieta Pert, MD [facility]  08/26/2020, 7:47 AM

## 2020-08-26 NOTE — Progress Notes (Signed)
Report given to Denny Peon, RN 4 [facility] [place].  Pt ready for transfer, visitor at bedside.

## 2020-08-26 NOTE — Progress Notes (Signed)
Occupational Therapy Treatment Patient Details Name: [name] [name] MRN: [id number] DOB: [birth year] Today's Date: 08/26/2020    History of present illness 87 yo female admitted with SBO, UTI,   S/P Exploratory laparotomy, detorsion of small bowel, placement of Stamm Gastrostomy tube, 08/23/20. Hx of falls, pulm fibrosis, chronic respiratory failure-O2 dep 2L, DVT, osteoporosis, RA, DJD, CAD, CHF   OT comments  Treatment focused on improving functional mobility, activity tolerance and participation in self care tasks. Patient able to ambulate to bathroom with RW and perform toileting. Min assist for improving quality of task with pericare and mod assist to stand from [facility]. Patient's o2 sats maintained within functional limits on 2 L, therapist managed all lines and leads including drain. Patient reports fatigue after treatment. Patient lives alone and does not have 24/7 supervision. Would recommend short term rehab at discharge due to lack of assistance at home and patient's continued deficits.   Follow Up Recommendations  SNF    Equipment Recommendations  3 in 1 bedside commode    Recommendations for Other Services      Precautions / Restrictions Precautions Precautions: Fall Precaution Comments: O2 dep,  gastrostomy tube drain, Required Braces or Orthoses: Other Brace Other Brace: abdominal binder-OOPs did not place it today Restrictions Weight Bearing Restrictions: No       Mobility Bed Mobility Overal bed mobility: Needs Assistance Bed Mobility: Supine to Sit Rolling: Min assist Sidelying to sit: Min assist     Sit to sidelying: Mod assist General bed mobility comments: cues for precautions of Abdomen  Transfers Overall transfer level: Needs assistance Equipment used: Rolling walker (2 wheeled) Transfers: Sit to/from Stand Sit to Stand: Min assist         General transfer comment: min from bed, mod from toilet.    Balance Overall balance assessment: Needs  assistance Sitting-balance support: Feet supported Sitting balance-[name]: Good     Standing balance support: Bilateral upper extremity supported Standing balance-[name]: Fair                             ADL either performed or assessed with clinical judgement   ADL Overall ADL's : Needs assistance/impaired                     Lower Body Dressing: Min guard;Sit to/from stand Lower Body Dressing Details (indicate cue type and reason): able to adjust socks in seated position. Toilet Transfer: Nurse, children's Details (indicate cue type and reason): ambulated to bathroom with RW. Performed toilet transfer with min guard (BSC placed over toilet). Mod assist to stand from toilet. Toileting- Clothing Manipulation and Hygiene: Minimal assistance;Sit to/from stand Toileting - Clothing Manipulation Details (indicate cue type and reason): min assist for toileting - to improve quality of task after patient performed and for minimal clothing managemetn.     Functional mobility during ADLs: Min guard;Rolling walker       Vision Patient Visual Report: No change from baseline     Perception     Praxis      Cognition Arousal/Alertness: Awake/alert Behavior During Therapy: WFL for tasks assessed/performed Overall Cognitive Status: Within Functional Limits for tasks assessed                                          Exercises  Shoulder Instructions       General Comments      Pertinent Vitals/ Pain       Pain Assessment: Faces Faces Pain Scale: Hurts even more Pain Location: abdomen Pain Descriptors / Indicators: Discomfort;Grimacing;Guarding Pain Intervention(s): Limited activity within patient's tolerance;Repositioned  Home Living                                          Prior Functioning/Environment              Frequency  Min 2X/week        Progress Toward Goals  OT  Goals(current goals can now be found in the care plan section)  Progress towards OT goals: Progressing toward goals  Acute Rehab OT Goals Patient Stated Goal: return to independence OT Goal Formulation: With patient Time For Goal Achievement: 09/02/20 Potential to Achieve Goals: Good  Plan Discharge plan needs to be updated    Co-evaluation    PT/OT/SLP Co-Evaluation/Treatment: Yes Reason for Co-Treatment: To address functional/ADL transfers;For patient/therapist safety PT goals addressed during session: Mobility/safety with mobility OT goals addressed during session: ADL's and self-care      AM-PAC OT "6 Clicks" Daily Activity     Outcome Measure   Help from another person eating meals?: None Help from another person taking care of personal grooming?: A Little Help from another person toileting, which includes using toliet, bedpan, or urinal?: A Little Help from another person bathing (including washing, rinsing, drying)?: A Little Help from another person to put on and taking off regular upper body clothing?: A Little Help from another person to put on and taking off regular lower body clothing?: A Little 6 Click Score: 19    End of Session Equipment Utilized During Treatment: Oxygen;Rolling walker  OT Visit Diagnosis: Unsteadiness on feet (R26.81);Pain;Muscle weakness (generalized) (M62.81)   Activity Tolerance Patient tolerated treatment well   Patient Left in chair;with call bell/phone within reach;with chair alarm set   Nurse Communication Mobility status        Time: 0086-7619 OT Time Calculation (min): 27 min  Charges: OT General Charges $OT Visit: 1 Visit OT Treatments $Self Care/Home Management : 8-22 mins  Derl Barrow, OTR/L [place]  Office [phone] Pager: [place] 08/26/2020, 2:12 PM

## 2020-08-26 NOTE — TOC Progression Note (Signed)
Transition of Care [facility]) - Progression Note    Patient Details  Name: [name] [name] MRN: [id number] Date of Birth: 05/07/1931  Transition of Care [facility]) CM/SW Contact  Leeroy Cha, RN Phone Number: 08/26/2020, 8:07 AM  Clinical Narrative:    SBO and was admitted for further management. 2/8- started on CLD, but had episode of vomiting on 2/9 - had Xray abd that showed normal bowel gas pattern. She is followed closely by general surgery.  Again had episode of vomiting 2/10 am, surgery planned for enema.Nephew wanted to look into second opinion at [facility]- called  and discussed w/ Transfer center , they are at capacity- was called back by Hospitalist from [facility] and they have declined transfer, nephew made aware of.2/11-GI also consulted, repeat CT abdomen done that showed -progressive small bowel obstruction with significant gastric and small bowel distention, transition point associated with an intraluminal lesion in the mid distal ileum in the mid pelvis, most likely a large gallstone ileus. 2/12: Multiple attempts for NG tube insertion has been failed-by nursing/surgeon as well as IR. 2/13: Patient with ongoing more abdominal pain significant tenderness and some confusion- s/p ex lap with detorsion of small intestine and transferred to ICU for postop ICU care  Subjective:  Patient is alert awake oriented, not in acute distress.  No BM or flatus.  Leukocytosis further trending up but afebrile.  Denies headache chills fever. G-tube to gravity, off Foley.  Assessment & Plan:  SBO/torsion of the small bowel mesentery: s/p exlap,detorsion of small bowel and placement of gastrostomy tube 2/13 ( Dr Zenia Resides).  Continue diet n.p.o. as per CCS, encourage ambulation, PT OT.  Continue gastrostomy tube drainage to gravity.  Continue pain control muscle relaxant.   Hypotension postop in the setting of third spacing/Anaesthesia/dehydration.  It has resolved.  Did not need pressors.  Will wean her  hydrocortisone to 1oo mg TODAY,if blood pressure is stable tomorrow can change to home dose. Cont TPN.  Acute on chronic hypoxic respiratory failure on 2 L [place]  PLAN IS TO RETURN TO HOME. Expected Discharge Plan: [place] Barriers to Discharge: Continued Medical Work up  Expected Discharge Plan and Services Expected Discharge Plan: [place]   Discharge Planning Services: CM Consult Post Acute Care Choice: [place] arrangements for the past 2 months: [place]: PT,OT [place]: [place] Date [facility] Agency Contacted: 08/21/20 Time [place]: [phone] Representative spoke with at [place]: [place] ([place]) Interventions    Readmission Risk Interventions No flowsheet data found.

## 2020-08-26 NOTE — Progress Notes (Signed)
3 Days Post-Op    CC:  Abdominal pain  Subjective: She is much more alert and with it this AM.  She denies any flatus so far.  She actually had her oxygen off and her sats were good at 92% on room air, but when I asked her about it she put it back on.  Her midline incision looks fine.  Gastrostomy outputs not recorded.  Foley has been removed.  Objective: Vital signs in last 24 hours: Temp:  [98 F (36.7 C)-98.6 F (37 C)] 98 F (36.7 C) (02/16 0322) Pulse Rate:  [72-86] 74 (02/16 0600) Resp:  [21-29] 21 (02/16 0600) BP: (102-139)/(47-83) 122/52 (02/16 0600) SpO2:  [91 %-99 %] 94 % (02/16 0600) Last BM Date: 08/21/20 2832 IV Urine 1715 Afebrile, VSS WBC up to 21.7   Intake/Output from previous day: 02/15 0701 - 02/16 0700 In: 2832.6 [I.V.:2112.8; IV Piggyback:719.9] Out: 1715 [Urine:1715] Intake/Output this shift: Total I/O In: 963 [I.V.:963] Out: 650 [Urine:650]  General appearance: alert, cooperative, no distress and Says she feels better, says she could not pronounce her name before. Resp: Clear anterior.  Sats good with oxygen off. GI: Soft she is not distended.  No bowel sounds.  No BM recorded.  Patient reports no flatus.  Midline incision looks fine. Extremities: no edema  Lab Results:  Recent Labs    08/25/20 0807 08/26/20 0244  WBC 18.5* 21.7*  HGB 9.4* 10.1*  HCT 30.1* 32.5*  PLT 145* 160    BMET Recent Labs    08/25/20 0231 08/26/20 0244  NA 137 138  K 4.2 3.7  CL 104 102  CO2 26 28  GLUCOSE 159* 143*  BUN 14 14  CREATININE 0.43* 0.44  CALCIUM 8.2* 8.1*   PT/INR No results for input(s): LABPROT, INR in the last 72 hours.  Recent Labs  Lab 08/22/20 0634 08/23/20 0613 08/24/20 0408 08/25/20 0231 08/26/20 0244  AST 17 20 18 15 16   ALT 12 11 10 10 12   ALKPHOS 66 55 44 45 51  BILITOT 1.0 0.7 0.6 0.6 0.6  PROT 6.8 5.9* 5.1* 4.9* 5.0*  ALBUMIN 3.3* 2.8* 2.4* 2.2* 2.2*     Lipase     Component Value Date/Time   LIPASE 27  08/17/2020 0410     Medications: . bisacodyl  10 mg Rectal Daily  . chlorhexidine  15 mL Mouth Rinse BID  . Chlorhexidine Gluconate Cloth  6 each Topical Daily  . enoxaparin (LOVENOX) injection  30 mg Subcutaneous Q24H  . hydrocortisone sod succinate (SOLU-CORTEF) inj  100 mg Intravenous Q12H  . insulin aspart  0-15 Units Subcutaneous Q4H  . mouth rinse  15 mL Mouth Rinse q12n4p  . mupirocin ointment  1 application Nasal BID  . sodium chloride flush  10-40 mL Intracatheter Q12H    Assessment/Plan Pulmonary fibrosis/interstitial lung disease Rheumatoid arthritis - cellcept/Remicade/predinsone  - hydrocortisone 100mg  BID IV currently  Hx CAD/CHF Hx of DVT  Anemia Severe protein-calorie malnutrition- prealbumin 6.0 Leukocytosis 14.6>>16.9>>14.3>>14.3>>21.7 UTI > 100K Pseudomonas  SBO/Torsion of small bowel mesentary Exploratory laparotomy, detorsion of small bowel, placement of Stamm Gastrostomy tube, 08/23/20, Michaelle Birks  FEN: NPO/TPN; G-tube to straight drain; awaiting return of bowel function; may have ice chips ID: rocephin 2/7- 2/12 DVT: Lovenox Follow up: Dr. Zenia Resides Foley: In with minimal output last shift   Plan: Mobilize and await return of bowel function.  Continue TPN/sips and chips for oral comfort. Work keep the magnesium at 2.0 and potassium at 4.0 level.  Dr. Maren Beach says ID has recommended no antibiotics at this point.  IV tylenol for pain, she has had nothing since 8 PM last evening      LOS: 9 days    [name] [name] 08/26/2020 Please see Amion

## 2020-08-26 NOTE — Progress Notes (Signed)
PHARMACY - TOTAL PARENTERAL NUTRITION CONSULT NOTE   Indication: Small bowel obstruction  Patient Measurements: Height: 5' 4.5" (163.8 cm) Weight: 44.5 kg (98 lb) IBW/kg (Calculated) : 55.85 TPN AdjBW (KG): 44.5 Body mass index is 16.56 kg/m.  Assessment: 86 yo female with persistent SBO that may be result of gallstone ileus. Multiple attempts at NGT have failed; per [facility], at this time no further options besides doing surgery although high risk. She is now s/p exploratory laparotomy, detorsion of small bowel, placement of Stamm gastrostomy tube on 08/23/20.  Glucose / Insulin: CBG goal 100-150; range 131-176 - note patient on chronic steroids currently on solucortef 100mg  q12 >> daily - 15 units SSI/24hr required Electrolytes: WNL; Corrected Ca WNL at 9.54 Renal: Scr 0.44 stable LFTs / TGs: LFTs WNL, TG 39 Prealbumin / albumin: prealb 6.0, alb 2.2 Intake / Output: net +1117, UOP 1746ml MIVF: 1/2 NS at 30 ml/hr GI Imaging: 2/11 SBO per CT Surgeries / Procedures: 2/13 ex lap, detorsion of small bowel, placement of G- tube  Central access: 2/12 - PICC TPN start date: 2/12  Nutritional Goals (per RD recommendation on 2/11): Severe malnutrition kCal: 1500-1700, Protein: 70-85, Fluid: >= 1.4L  Note that currently there is a Lipid emulsion shortage so SMOFlipid will only be added on MWF for appropriate patients  Goal TPN rate MWF with lipid is 60 mL/hr (provides 72 g of protein and 1699 kcals per day)  Goal TPN rate TuThSatSun without lipid is 60 ml/hr (provides 72g protein and 1267 kcal)   Current Nutrition:  NPO except ice chips, sips with meds TPN at 60 ml/hr  Plan:  Continue TPN at goal rate 60 ml/hr Electrolytes in TPN:   75mEq/L of Na  Increase 73mEq/L of K   30mEq/L of Ca,   49mEq/L of Mg  10mmol/L of Phos  Cl:Ac ratio 1:1 Add standard MVI and trace elements to TPN Continue moderate q4h SSI and adjust as needed; add insulin to TPN if necessary but hold off  today due to steroid taper Pepcid 20mg  per day in TPN Continue MIVF per [facility]CS - 1/2 NS 30 ml/mhr Monitor TPN labs on Mon/Thurs and as needed  Napoleon Form  08/26/2020,8:54 AM

## 2020-08-27 DIAGNOSIS — K56609 Unspecified intestinal obstruction, unspecified as to partial versus complete obstruction: Secondary | ICD-10-CM | POA: Diagnosis not present

## 2020-08-27 LAB — CBC
HCT: 32.9 % — ABNORMAL LOW (ref 36.0–46.0)
Hemoglobin: 10.4 g/dL — ABNORMAL LOW (ref 12.0–15.0)
MCH: 30.8 pg (ref 26.0–34.0)
MCHC: 31.6 g/dL (ref 30.0–36.0)
MCV: 97.3 fL (ref 80.0–100.0)
Platelets: 195 10*3/uL (ref 150–400)
RBC: 3.38 MIL/uL — ABNORMAL LOW (ref 3.87–5.11)
RDW: 14.6 % (ref 11.5–15.5)
WBC: 18.8 10*3/uL — ABNORMAL HIGH (ref 4.0–10.5)
nRBC: 0 % (ref 0.0–0.2)

## 2020-08-27 LAB — COMPREHENSIVE METABOLIC PANEL
ALT: 22 U/L (ref 0–44)
AST: 27 U/L (ref 15–41)
Albumin: 2 g/dL — ABNORMAL LOW (ref 3.5–5.0)
Alkaline Phosphatase: 59 U/L (ref 38–126)
Anion gap: 7 (ref 5–15)
BUN: 18 mg/dL (ref 8–23)
CO2: 29 mmol/L (ref 22–32)
Calcium: 8.5 mg/dL — ABNORMAL LOW (ref 8.9–10.3)
Chloride: 102 mmol/L (ref 98–111)
Creatinine, Ser: 0.34 mg/dL — ABNORMAL LOW (ref 0.44–1.00)
GFR, Estimated: >60 mL/min (ref >60)
Glucose, Bld: 102 mg/dL — ABNORMAL HIGH (ref 70–99)
Potassium: 3.4 mmol/L — ABNORMAL LOW (ref 3.5–5.1)
Sodium: 138 mmol/L (ref 135–145)
Total Bilirubin: 0.5 mg/dL (ref 0.3–1.2)
Total Protein: 4.8 g/dL — ABNORMAL LOW (ref 6.5–8.1)

## 2020-08-27 LAB — GLUCOSE, CAPILLARY
Glucose-Capillary: 104 mg/dL — ABNORMAL HIGH (ref 70–99)
Glucose-Capillary: 102 mg/dL — ABNORMAL HIGH (ref 70–99)
Glucose-Capillary: 133 mg/dL — ABNORMAL HIGH (ref 70–99)
Glucose-Capillary: 162 mg/dL — ABNORMAL HIGH (ref 70–99)

## 2020-08-27 LAB — PHOSPHORUS: Phosphorus: 2.2 mg/dL — ABNORMAL LOW (ref 2.5–4.6)

## 2020-08-27 LAB — PROCALCITONIN: Procalcitonin: <0.1 ng/mL

## 2020-08-27 LAB — MAGNESIUM: Magnesium: 1.8 mg/dL (ref 1.7–2.4)

## 2020-08-27 MED ORDER — PREDNISONE 5 MG PO TABS
5.0000 mg | ORAL_TABLET | Freq: Every day | ORAL | Status: DC
  Administered 2020-08-29 – 2020-09-08 (×11): 5 mg via ORAL
  Filled 2020-08-27 (×11): qty 1

## 2020-08-27 MED ORDER — PREDNISONE 10 MG PO TABS
10.0000 mg | ORAL_TABLET | Freq: Every day | ORAL | Status: AC
  Administered 2020-08-28: 10 mg via ORAL
  Filled 2020-08-27: qty 1

## 2020-08-27 MED ORDER — SODIUM CHLORIDE 0.9 % IV SOLN
INTRAVENOUS | Status: DC
  Administered 2020-08-27: 22:00:00 1 mL via INTRAVENOUS

## 2020-08-27 MED ORDER — TRAVASOL 10 % IV SOLN
INTRAVENOUS | Status: AC
  Filled 2020-08-27: qty 720

## 2020-08-27 MED ORDER — INSULIN ASPART 100 UNIT/ML ~~LOC~~ SOLN
0.0000 [IU] | Freq: Four times a day (QID) | SUBCUTANEOUS | Status: DC
  Administered 2020-08-27: 2 [IU] via SUBCUTANEOUS
  Administered 2020-08-27 – 2020-08-29 (×3): 1 [IU] via SUBCUTANEOUS
  Administered 2020-08-29: 2 [IU] via SUBCUTANEOUS
  Administered 2020-08-29 – 2020-08-30 (×2): 1 [IU] via SUBCUTANEOUS
  Administered 2020-08-31: 2 [IU] via SUBCUTANEOUS

## 2020-08-27 MED ORDER — ACETAMINOPHEN 10 MG/ML IV SOLN
1000.0000 mg | Freq: Four times a day (QID) | INTRAVENOUS | Status: AC
  Administered 2020-08-27 – 2020-08-28 (×4): 1000 mg via INTRAVENOUS
  Filled 2020-08-27 (×4): qty 100

## 2020-08-27 MED ORDER — MAGNESIUM SULFATE 2 GM/50ML IV SOLN
2.0000 g | Freq: Once | INTRAVENOUS | Status: AC
  Administered 2020-08-27: 2 g via INTRAVENOUS
  Filled 2020-08-27: qty 50

## 2020-08-27 MED ORDER — POTASSIUM PHOSPHATES 15 MMOLE/5ML IV SOLN
30.0000 mmol | Freq: Once | INTRAVENOUS | Status: AC
  Administered 2020-08-27: 30 mmol via INTRAVENOUS
  Filled 2020-08-27 (×2): qty 10

## 2020-08-27 NOTE — Progress Notes (Signed)
GT has been clamped since 0915 am , pt had ice chips, sips of clears.........chicken and beef broth, along with jello throughout the day, tolerated without c/o of n/v or abdominal pain. Sat up in chair for several hours throughout the day. Had one BM, soft liquid stool. Void via [place]. ABD binder at bedside and instructed on using IS. Will cont to monitor. SRP,RN

## 2020-08-27 NOTE — Progress Notes (Addendum)
PHARMACY - TOTAL PARENTERAL NUTRITION CONSULT NOTE   Indication: Small bowel obstruction  Patient Measurements: Height: 5' 4.5" (163.8 cm) Weight: 54.1 kg (119 lb 4.3 oz) IBW/kg (Calculated) : 55.85 TPN AdjBW (KG): 44.5 Body mass index is 20.16 kg/m.  Assessment: 87 yo female with persistent SBO that may be result of gallstone ileus. Multiple attempts at NGT have failed; per [facility], at this time no further options besides doing surgery although high risk. She is now s/p exploratory laparotomy, detorsion of small bowel, placement of Stamm gastrostomy tube on 08/23/20.  Glucose / Insulin: CBG goal 100-150; range 102-141; significantly lower since tapering steroid dose - note patient on chronic steroids currently on solucortef 100mg  q12 >> daily - 11 units SSI/24hr required Electrolytes: K, Phos low; Mag below goal for ileus; others stable WNL Renal: SCr low, BUN elevated (steroids?), bicarb stable WNL; UOP appears incompletely charted, but previously good LFTs / TGs: LFTs, TG WNL Prealbumin / albumin: both low and decreased from prior Intake / Output: minimal gastrostomy OP; clamping trial 2/17 MIVF: 1/2 NS at 30 ml/hr + NS at [facility] GI Imaging: 2/11 SBO per CT Surgeries / Procedures: 2/13 ex lap, detorsion of small bowel, placement of G- tube  Central access: 2/12 - PICC TPN start date: 2/12  Nutritional Goals (per RD recommendation on 2/11): Severe malnutrition kCal: 1500-1700, Protein: 70-85, Fluid: >= 1.4L  Note that currently there is a Lipid emulsion shortage so SMOFlipid will only be added on MWF for appropriate patients  Goal TPN rate MWF with lipid is 60 mL/hr (provides 72 g of protein and 1699 kcals per day)  Goal TPN rate TuThSatSun without lipid is 60 ml/hr (provides 72g protein and 1267 kcal)   Current Nutrition:  Advancing to CLD today TPN at 60 ml/hr  Plan:  Mag 2g IV x 1 KPhos 30 mmol IV x 1  Continue TPN at goal rate 60 ml/hr Electrolytes in TPN:   108mEq/L  of Na  Increase 65mEq/L of K   85mEq/L of Ca,   66mEq/L of Mg  Increase 64mmol/L of Phos  Cl:Ac ratio 1:1 Add standard MVI and trace elements to TPN Adjust moderate SSI to sensitive scale with q6 hr CBG checks Pepcid 20mg  per day in TPN MIVFs discontinued per [facility] Monitor TPN labs on Mon/Thurs and as needed - Bmet, Mg, Phos tomorrow  Antoria Lanza A  08/27/2020,8:33 AM

## 2020-08-27 NOTE — Progress Notes (Signed)
Pt had soft liquid stool. SRP, RN

## 2020-08-27 NOTE — Progress Notes (Signed)
PROGRESS NOTE    [name] [name]  VQM:[id number] DOB: 12/14/1930 DOA: 08/17/2020 PCP: Hoyt Koch, MD   Chief Complaint  Patient presents with  . Abdominal Pain  Brief Narrative: 87 year old female with history of chronic hypoxic respiratory failure on 2 L nasal cannula secondary to ILD/pulmonary fibrosis,rheumatoid arthritis on chronic prednisone, CAD/CHF, dyslipidemia, history of DVT, presented with nausea vomiting abdominal pain, onset 3 days prior to admission.Patient seen in the ED 2/7 reported having a bowel movement a week prior to admission in the ED found to have SBO, fecal impaction disimpacted x2 with enema, [facility]CS was consulted for SBO and was admitted for further management. 2/8- started on CLD, but had episode of vomiting on 2/9 - had Xray abd that showed normal bowel gas pattern. She is followed closely by general surgery.  Again had episode of vomiting 2/10 am, surgery planned for enema.Nephew wanted to look into second opinion at [facility]- called  and discussed w/ Transfer center , they are at capacity- was called back by Hospitalist from [facility] and they have declined transfer, nephew made aware of.2/11-GI also consulted, repeat CT abdomen done that showed -progressive small bowel obstruction with significant gastric and small bowel distention, transition point associated with an intraluminal lesion in the mid distal ileum in the mid pelvis, most likely a large gallstone ileus. 2/12: Multiple attempts for NG tube insertion has been failed-by nursing/surgeon as well as IR. 2/13: Patient with ongoing more abdominal pain significant tenderness and some confusion- s/p ex lap with detorsion of small intestine and transferred to ICU for postop ICU care  Subjective: Seen and examined this morning. She is alert awake, not in acute distress, reports small bowel movement yesterday Did not take any narcotics yesterday. Not passing flatus  Assessment & Plan:  SBO/torsion of the small bowel  mesentery: s/p exlap,detorsion of small bowel and placement of gastrostomy tube 2/13 ( Dr Zenia Resides). Starting ice chips sips with meds. Followed by [facility] appreciate input increase PT OT activity, diet as per surgery. G-tube is clamped.Continue pain control muscle relaxant, new meds narcotics.   Hypotension postop in the setting of third spacing/Anaesthesia/dehydration.  It has resolved.  Did not need pressors.  Will wean her hydrocortisone-to home dose prednisone.  Acute on chronic hypoxic respiratory failure on 2 L Terminous: Continue supplemental oxygen to maintain saturation 94%. Continue incentive spirometry ambulation PT OT as needed bronchodilators.  ILD/pulmonary fibrosis/Rheumatoid arthritis on chronic prednisone, Ofev, CellCept/Remicade outpatient infusion: Resume prednisone 10 mg 2/18 then from 2/19 home dose 5 mg. I had communicated to her Pulmonologist Dr Vaughan Browner and okay to hold her ILD meds for now.  Hx of CAD/CHF ( D) /Dyslipidemia: No evidence of fluid retention.Normally she takes Lasix 20 mg as needed for fluid or edema. She has not been taking aspirin and metoprolol at home prior to admission. Watch for fluid overload  UTI on admission UA grossly abnormal UA, repeat UA 2/12-grossly abnormal ( wbc>50,rbc>50, cloudy, LE trace) and urine culture >100,000 Pseudomonas aeruginosa and Enterococcus faecalis.  Seen by ID Dr Juleen China- discontinued antibiotics.  Patient denies any dysuria urinary frequency fever.    Leukocytosis persistent, patient received ceftriaxone and comleted.  Antibiotics were discontinued as per ID.  Leukocytosis was trending up we will check procalcitonin and is reassuring likely from steroid, WBC downtrending as weaning on her steroid. I discussed with infectious disease doctor yesterday. Recent Labs  Lab 08/23/20 0613 08/24/20 0408 08/25/20 0807 08/26/20 0244 08/27/20 0448  WBC 14.3* 14.3* 18.5* 21.7* 18.8*  History of DVT not on anticoagulation due to history of falls.  Continue Lovenox prophylaxis  Normocytic anemia hemoglobin remains stable. Monitor hemoglobin. Recent Labs  Lab 08/23/20 1059 08/24/20 0408 08/25/20 0807 08/26/20 0244 08/27/20 0448  HGB 11.2* 10.3* 9.4* 10.1* 10.4*  HCT 33.0* 34.5* 30.1* 32.5* 32.9*   Hypophosphatemia/hypomagnesemia, monitor and replete  Dehydration/ketosis in the urine, cont TPN.  Goals of care full code.Multiple comorbidities,with significant pulmonary disease,chronic respiratory failure now with SBO-debilitated.palliative care consultation has been requested to follow along.  Nutrition Status: Severe protein calorie malnourishment present.  Has been on TPN 2/12.  Pharmacy dietitian following. Nutrition Problem: Severe Malnutrition Etiology: chronic illness (ILD/pulmonary fibrosis) Signs/Symptoms: severe fat depletion,severe muscle depletion Interventions: MVI,Boost Plus,Refer to RD note for recommendations  Diet Order            Diet NPO time specified Except for: Ice Chips, Sips with Meds, Other (See Comments)  Diet effective now                DVT prophylaxis: enoxaparin (LOVENOX) injection 30 mg Start: 08/17/20 2200 Code Status:   Code Status: Full Code  Family Communication:  Her nephew has been updated previously and extensively. Discussed with the patient this morning at bedside.  Status is Inpatient Remains inpatient appropriate because:IV treatments appropriate due to intensity of illness or inability to take PO and Inpatient level of care appropriate due to severity of illness.  Dispo:The patient is from: Home            Anticipated d/c is to: tbd             Anticipated d/c date is: 2-3 days            Patient currently is not medically stable to d/c.            Difficult to place patient No   Consultants: [facility] GI PALLIATIVE MEDICINE PCCM   Procedures: EX LAP 2/13  Culture/Microbiology Other culture-see note  Medications: Scheduled Meds: . bisacodyl  10 mg Rectal Daily  .  chlorhexidine  15 mL Mouth Rinse BID  . Chlorhexidine Gluconate Cloth  6 each Topical Daily  . enoxaparin (LOVENOX) injection  30 mg Subcutaneous Q24H  . hydrocortisone sod succinate (SOLU-CORTEF) inj  100 mg Intravenous Daily  . insulin aspart  0-9 Units Subcutaneous Q6H  . mouth rinse  15 mL Mouth Rinse q12n4p  . mupirocin ointment  1 application Nasal BID  . sodium chloride flush  10-40 mL Intracatheter Q12H   Continuous Infusions: . acetaminophen 1,000 mg (08/27/20 1308)  . methocarbamol (ROBAXIN) IV 500 mg (08/27/20 5809)  . potassium PHOSPHATE IVPB (in mmol)    . TPN ADULT (ION) 60 mL/hr at 08/26/20 1828  . TPN ADULT (ION)      Antimicrobials: Anti-infectives (From admission, onward)   Start     Dose/Rate Route Frequency Ordered Stop   08/23/20 0930  ceFAZolin (ANCEF) IVPB 2g/100 mL premix  Status:  Discontinued       "And" Linked Group Details   2 g 200 mL/hr over 30 Minutes Intravenous On call to O.R. 08/23/20 9833 08/23/20 0841   08/23/20 0930  metroNIDAZOLE (FLAGYL) IVPB 500 mg       "And" Linked Group Details   500 mg 100 mL/hr over 60 Minutes Intravenous On call to O.R. 08/23/20 8250 08/23/20 1027   08/23/20 0923  ceFAZolin (ANCEF) 2-4 GM/100ML-% IVPB  Status:  Discontinued       Note to Pharmacy: Enrigue Catena   :  cabinet override      08/23/20 0923 08/23/20 0938   08/19/20 1000  dapsone tablet 100 mg  Status:  Discontinued       Note to Pharmacy: TAKE 1 TABLET(100 MG) BY MOUTH DAILY     100 mg Oral Daily 08/18/20 1350 08/22/20 0805   08/18/20 1000  cefTRIAXone (ROCEPHIN) 1 g in sodium chloride 0.9 % 100 mL IVPB  Status:  Discontinued        1 g 200 mL/hr over 30 Minutes Intravenous Every 24 hours 08/17/20 1414 08/24/20 1209   08/17/20 0615  cefTRIAXone (ROCEPHIN) 1 g in sodium chloride 0.9 % 100 mL IVPB        1 g 200 mL/hr over 30 Minutes Intravenous  Once 08/17/20 0605 08/17/20 0801     Objective: Vitals: Today's Vitals   08/27/20 0159 08/27/20 0500  08/27/20 0600 08/27/20 1309  BP: 104/64  115/66 (!) 100/58  Pulse: 83  80 90  Resp: (!) 22  (!) 22 20  Temp: 98.3 F (36.8 C)  98.3 F (36.8 C) 99.1 F (37.3 C)  TempSrc: Oral  Oral Oral  SpO2: 93%  95% 98%  Weight:  54.1 kg    Height:      PainSc:        Intake/Output Summary (Last 24 hours) at 08/27/2020 1336 Last data filed at 08/27/2020 1134 Gross per 24 hour  Intake 2054.75 ml  Output 800 ml  Net 1254.75 ml   Filed Weights   08/17/20 0326 08/27/20 0500  Weight: 44.5 kg 54.1 kg   Weight change:   Intake/Output from previous day: 02/16 0701 - 02/17 0700 In: 2833 [P.O.:50; I.V.:2343.9; IV Piggyback:439.1] Out: 650 [Urine:650] Intake/Output this shift: Total I/O In: -  Out: 150 [Urine:150] Filed Weights   08/17/20 0326 08/27/20 0500  Weight: 44.5 kg 54.1 kg   Examination: General exam: AAOx3, weak, old for age,NAD, weak appearing. HEENT:Oral mucosa moist, Ear/Nose WNL grossly, dentition normal. Respiratory system: bilaterally clear anteriorly, crackles present posteriorly, no use of accessory muscle Cardiovascular system: S1 & S2 +, No JVD,. Gastrointestinal system: Abdomen soft, G tube+,BS+,incision CDI with staples and no erythema or drainage   Nervous System:Alert, awake, moving extremities and grossly nonfocal Extremities: No edema, distal peripheral pulses palpable.  Skin: No rashes,no icterus. MSK: thin muscle bulk,tone, power.  Data Reviewed: I have personally reviewed following labs and imaging studies CBC: Recent Labs  Lab 08/22/20 0634 08/23/20 0613 08/23/20 1059 08/24/20 0408 08/25/20 0807 08/26/20 0244 08/27/20 0448  WBC 16.6* 14.3*  --  14.3* 18.5* 21.7* 18.8*  NEUTROABS 12.5*  --   --  11.8*  --   --   --   HGB 12.2 11.5* 11.2* 10.3* 9.4* 10.1* 10.4*  HCT 40.4 36.8 33.0* 34.5* 30.1* 32.5* 32.9*  MCV 101.0* 99.2  --  104.9* 99.7 98.8 97.3  PLT 262 201  --  157 145* 160 732   Basic Metabolic Panel: Recent Labs  Lab 08/23/20 0613  08/23/20 1059 08/24/20 0408 08/25/20 0231 08/26/20 0244 08/27/20 0448  NA 145 147* 141 137 138 138  K 3.5 3.5 4.1 4.2 3.7 3.4*  CL 109  --  108 104 102 102  CO2 26  --  25 26 28 29   GLUCOSE 216*  --  187* 159* 143* 102*  BUN 23  --  16 14 14 18   CREATININE 0.71  --  0.46 0.43* 0.44 0.34*  CALCIUM 9.0  --  7.8* 8.2* 8.1* 8.5*  MG  1.8  --  1.9 1.6* 2.0 1.8  PHOS 1.7*  --  2.3* 2.1* 2.7 2.2*   GFR: Estimated Creatinine Clearance: 42.3 mL/min (A) (by C-G formula based on SCr of 0.34 mg/dL (L)). Liver Function Tests: Recent Labs  Lab 08/23/20 0613 08/24/20 0408 08/25/20 0231 08/26/20 0244 08/27/20 0448  AST 20 18 15 16 27   ALT 11 10 10 12 22   ALKPHOS 55 44 45 51 59  BILITOT 0.7 0.6 0.6 0.6 0.5  PROT 5.9* 5.1* 4.9* 5.0* 4.8*  ALBUMIN 2.8* 2.4* 2.2* 2.2* 2.0*   No results for input(s): LIPASE, AMYLASE in the last 168 hours. No results for input(s): AMMONIA in the last 168 hours. Coagulation Profile: No results for input(s): INR, PROTIME in the last 168 hours. Cardiac Enzymes: No results for input(s): CKTOTAL, CKMB, CKMBINDEX, TROPONINI in the last 168 hours. BNP (last 3 results) Recent Labs    06/03/20 1001  PROBNP 100   HbA1C: No results for input(s): HGBA1C in the last 72 hours. CBG: Recent Labs  Lab 08/26/20 1932-12-14 08/26/20 2347 08/27/20 0347 08/27/20 0804 08/27/20 1136  GLUCAP 141* 135* 104* 102* 133*   Lipid Profile: No results for input(s): CHOL, HDL, LDLCALC, TRIG, CHOLHDL, LDLDIRECT in the last 72 hours. Thyroid Function Tests: No results for input(s): TSH, T4TOTAL, FREET4, T3FREE, THYROIDAB in the last 72 hours. Anemia Panel: No results for input(s): VITAMINB12, FOLATE, FERRITIN, TIBC, IRON, RETICCTPCT in the last 72 hours. Sepsis Labs: Recent Labs  Lab 08/26/20 0450 08/27/20 0448  PROCALCITON 0.19 <0.10    Recent Results (from the past 240 hour(s))  Culture, Urine     Status: Abnormal   Collection Time: 08/21/20  5:40 PM   Specimen: Urine,  Clean Catch  Result Value Ref Range Status   Specimen Description   Final    URINE, CLEAN CATCH Performed at [facility], [place] [address], [place], [place] [address]    Special Requests   Final    NONE Performed at [facility], [place] [address] [place], [place] [address]    Culture (A)  Final    >=100,000 COLONIES/mL PSEUDOMONAS AERUGINOSA >=100,000 COLONIES/mL ENTEROCOCCUS FAECALIS    Report Status 08/25/2020 FINAL  Final   Organism ID, Bacteria PSEUDOMONAS AERUGINOSA (A)  Final   Organism ID, Bacteria ENTEROCOCCUS FAECALIS (A)  Final      Susceptibility   Enterococcus faecalis - MIC*    AMPICILLIN <=2 SENSITIVE Sensitive     NITROFURANTOIN <=16 SENSITIVE Sensitive     VANCOMYCIN 1 SENSITIVE Sensitive     * >=100,000 COLONIES/mL ENTEROCOCCUS FAECALIS   Pseudomonas aeruginosa - MIC*    CEFTAZIDIME 4 SENSITIVE Sensitive     CIPROFLOXACIN <=0.25 SENSITIVE Sensitive     GENTAMICIN 2 SENSITIVE Sensitive     IMIPENEM 1 SENSITIVE Sensitive     PIP/TAZO <=4 SENSITIVE Sensitive     * >=100,000 COLONIES/mL PSEUDOMONAS AERUGINOSA  MRSA PCR Screening     Status: Abnormal   Collection Time: 08/23/20  1:27 PM   Specimen: Nasal Mucosa; Nasopharyngeal  Result Value Ref Range Status   MRSA by PCR POSITIVE (A) NEGATIVE Final    Comment:        The GeneXpert MRSA Assay (FDA approved for NASAL specimens only), is one component of a comprehensive MRSA colonization surveillance program. It is not intended to diagnose MRSA infection nor to guide or monitor treatment for MRSA infections. RESULT CALLED TO, READ BACK BY AND VERIFIED WITH: CARTER,K RN @1459  ON 08/23/20  JACKSON,K Performed at [facility], [place] [address] [place], [place] [address]      Radiology Studies: No results found.   LOS: 10 days   Antonieta Pert, MD [facility]  08/27/2020, 1:36 PM

## 2020-08-27 NOTE — Progress Notes (Signed)
[facility] Progress Note  4 Days Post-Op  Subjective: CC-  Comfortable this morning. Abdomen sore but pain well controlled on tylenol and robaxin. She did not take any narcotics yesterday. She had a small BM yesterday. Not passing flatus but feels rumbling in her abdomen. States that she sat up in the chair yesterday for a long time.  Objective: Vital signs in last 24 hours: Temp:  [97.6 F (36.4 C)-98.3 F (36.8 C)] 98.3 F (36.8 C) (02/17 0600) Pulse Rate:  [70-101] 80 (02/17 0600) Resp:  [18-32] 22 (02/17 0600) BP: (104-132)/(55-80) 115/66 (02/17 0600) SpO2:  [93 %-100 %] 95 % (02/17 0600) Weight:  [54.1 kg] 54.1 kg (02/17 0500) Last BM Date: 08/26/20  Intake/Output from previous day: 02/16 0701 - 02/17 0700 In: 2833 [P.O.:50; I.V.:2343.9; IV Piggyback:439.1] Out: 650 [Urine:650] Intake/Output this shift: No intake/output data recorded.  PE: Gen:  Alert, NAD, pleasant Pulm: mild tachypnea Abd: Soft, nondistended, +BS, midline incision cdi with staples present and no erythema or drainage, G tube site cdi and tube is to gravity  Lab Results:  Recent Labs    08/26/20 0244 08/27/20 0448  WBC 21.7* 18.8*  HGB 10.1* 10.4*  HCT 32.5* 32.9*  PLT 160 195   BMET Recent Labs    08/26/20 0244 08/27/20 0448  NA 138 138  K 3.7 3.4*  CL 102 102  CO2 28 29  GLUCOSE 143* 102*  BUN 14 18  CREATININE 0.44 0.34*  CALCIUM 8.1* 8.5*   PT/INR No results for input(s): LABPROT, INR in the last 72 hours. CMP     Component Value Date/Time   NA 138 08/27/2020 0448   NA 138 04/29/2020 0948   K 3.4 (L) 08/27/2020 0448   CL 102 08/27/2020 0448   CO2 29 08/27/2020 0448   GLUCOSE 102 (H) 08/27/2020 0448   BUN 18 08/27/2020 0448   BUN 11 04/29/2020 0948   CREATININE 0.34 (L) 08/27/2020 0448   CREATININE 0.74 11/30/2015 1327   CALCIUM 8.5 (L) 08/27/2020 0448   PROT 4.8 (L) 08/27/2020 0448   PROT 6.5 04/29/2020 0948   ALBUMIN 2.0 (L) 08/27/2020 0448   ALBUMIN  3.8 04/29/2020 0948   AST 27 08/27/2020 0448   ALT 22 08/27/2020 0448   ALKPHOS 59 08/27/2020 0448   BILITOT 0.5 08/27/2020 0448   BILITOT 0.7 04/29/2020 0948   GFRNONAA >60 08/27/2020 0448   GFRAA 69 04/29/2020 0948   Lipase     Component Value Date/Time   LIPASE 27 08/17/2020 0410       Studies/Results: No results found.  Anti-infectives: Anti-infectives (From admission, onward)   Start     Dose/Rate Route Frequency Ordered Stop   08/23/20 0930  ceFAZolin (ANCEF) IVPB 2g/100 mL premix  Status:  Discontinued       "And" Linked Group Details   2 g 200 mL/hr over 30 Minutes Intravenous On call to O.R. 08/23/20 9390 08/23/20 0841   08/23/20 0930  metroNIDAZOLE (FLAGYL) IVPB 500 mg       "And" Linked Group Details   500 mg 100 mL/hr over 60 Minutes Intravenous On call to O.R. 08/23/20 0841 08/23/20 1027   08/23/20 0923  ceFAZolin (ANCEF) 2-4 GM/100ML-% IVPB  Status:  Discontinued       Note to Pharmacy: Enrigue Catena   : cabinet override      08/23/20 0923 08/23/20 0938   08/19/20 1000  dapsone tablet 100 mg  Status:  Discontinued  Note to Pharmacy: TAKE 1 TABLET(100 MG) BY MOUTH DAILY     100 mg Oral Daily 08/18/20 1350 08/22/20 0805   08/18/20 1000  cefTRIAXone (ROCEPHIN) 1 g in sodium chloride 0.9 % 100 mL IVPB  Status:  Discontinued        1 g 200 mL/hr over 30 Minutes Intravenous Every 24 hours 08/17/20 1414 08/24/20 1209   08/17/20 0615  cefTRIAXone (ROCEPHIN) 1 g in sodium chloride 0.9 % 100 mL IVPB        1 g 200 mL/hr over 30 Minutes Intravenous  Once 08/17/20 0605 08/17/20 0801       Assessment/Plan Pulmonary fibrosis/interstitial lung disease Rheumatoid arthritis - cellcept/Remicade/predinsone  - hydrocortisone 100mg  daily IV currently  Hx CAD/CHF Hx of DVT  Anemia Severeprotein-caloriemalnutrition- prealbumin 6.0 Leukocytosis 14.6>>16.9>>14.3>>14.3>>21.7>>18.8 UTI > 100K Pseudomonas  SBO/Torsion of small bowel mesentary S/p Exploratory  laparotomy, detorsion of small bowel, placement of Stamm Gastrostomy tube, 08/23/20, Michaelle Birks - POD#4 - Clamp G tube and allow sips of clear liquids from the floor. If patient becomes distended or complains of nausea then return G tube to gravity drainage.  - Continue TPN until reliably tolerating diet - Continue PT/OT, mobilize.   FEN: TPN; clamp G-tube, sips of clears  ID: rocephin 2/7- 2/12 DVT: Lovenox Follow up: Dr. Zenia Resides Foley: out   LOS: 10 days    Wellington Hampshire, [facility] 08/27/2020, 8:33 AM Please see Amion for pager number during day hours 7:00am-4:30pm

## 2020-08-28 DIAGNOSIS — K56609 Unspecified intestinal obstruction, unspecified as to partial versus complete obstruction: Secondary | ICD-10-CM | POA: Diagnosis not present

## 2020-08-28 LAB — GLUCOSE, CAPILLARY
Glucose-Capillary: 112 mg/dL — ABNORMAL HIGH (ref 70–99)
Glucose-Capillary: 115 mg/dL — ABNORMAL HIGH (ref 70–99)
Glucose-Capillary: 119 mg/dL — ABNORMAL HIGH (ref 70–99)
Glucose-Capillary: 140 mg/dL — ABNORMAL HIGH (ref 70–99)

## 2020-08-28 LAB — CBC
HCT: 32 % — ABNORMAL LOW (ref 36.0–46.0)
Hemoglobin: 10 g/dL — ABNORMAL LOW (ref 12.0–15.0)
MCH: 30.6 pg (ref 26.0–34.0)
MCHC: 31.3 g/dL (ref 30.0–36.0)
MCV: 97.9 fL (ref 80.0–100.0)
Platelets: 206 10*3/uL (ref 150–400)
RBC: 3.27 MIL/uL — ABNORMAL LOW (ref 3.87–5.11)
RDW: 14.7 % (ref 11.5–15.5)
WBC: 17.1 10*3/uL — ABNORMAL HIGH (ref 4.0–10.5)
nRBC: 0 % (ref 0.0–0.2)

## 2020-08-28 LAB — BASIC METABOLIC PANEL
Anion gap: 8 (ref 5–15)
BUN: 17 mg/dL (ref 8–23)
CO2: 28 mmol/L (ref 22–32)
Calcium: 8.2 mg/dL — ABNORMAL LOW (ref 8.9–10.3)
Chloride: 101 mmol/L (ref 98–111)
Creatinine, Ser: 0.43 mg/dL — ABNORMAL LOW (ref 0.44–1.00)
GFR, Estimated: >60 mL/min (ref >60)
Glucose, Bld: 109 mg/dL — ABNORMAL HIGH (ref 70–99)
Potassium: 4.1 mmol/L (ref 3.5–5.1)
Sodium: 137 mmol/L (ref 135–145)

## 2020-08-28 LAB — PROCALCITONIN: Procalcitonin: <0.1 ng/mL

## 2020-08-28 LAB — MAGNESIUM: Magnesium: 2 mg/dL (ref 1.7–2.4)

## 2020-08-28 LAB — PHOSPHORUS: Phosphorus: 3.5 mg/dL (ref 2.5–4.6)

## 2020-08-28 MED ORDER — TRAVASOL 10 % IV SOLN
INTRAVENOUS | Status: AC
  Filled 2020-08-28: qty 720

## 2020-08-28 MED ORDER — LIP MEDEX EX OINT
TOPICAL_OINTMENT | CUTANEOUS | Status: DC | PRN
  Administered 2020-08-29: 1 via TOPICAL
  Filled 2020-08-28 (×2): qty 7

## 2020-08-28 MED ORDER — ACETAMINOPHEN 325 MG PO TABS
650.0000 mg | ORAL_TABLET | Freq: Four times a day (QID) | ORAL | Status: DC
  Administered 2020-08-28 – 2020-09-03 (×21): 650 mg via ORAL
  Filled 2020-08-28 (×20): qty 2

## 2020-08-28 MED ORDER — DOCUSATE SODIUM 100 MG PO CAPS
100.0000 mg | ORAL_CAPSULE | Freq: Two times a day (BID) | ORAL | Status: DC
  Administered 2020-08-28 – 2020-09-07 (×17): 100 mg via ORAL
  Filled 2020-08-28 (×19): qty 1

## 2020-08-28 MED ORDER — BOOST / RESOURCE BREEZE PO LIQD CUSTOM
1.0000 | Freq: Two times a day (BID) | ORAL | Status: DC
  Administered 2020-08-28 – 2020-09-07 (×18): 1 via ORAL

## 2020-08-28 MED ORDER — ENOXAPARIN SODIUM 40 MG/0.4ML ~~LOC~~ SOLN
40.0000 mg | SUBCUTANEOUS | Status: DC
  Administered 2020-08-28: 40 mg via SUBCUTANEOUS
  Filled 2020-08-28: qty 0.4

## 2020-08-28 MED ORDER — PROSOURCE PLUS PO LIQD
30.0000 mL | Freq: Two times a day (BID) | ORAL | Status: DC
  Administered 2020-08-28 – 2020-09-07 (×20): 30 mL via ORAL
  Filled 2020-08-28 (×20): qty 30

## 2020-08-28 NOTE — Progress Notes (Signed)
PROGRESS NOTE    [name] [name]  FBP:[id number] DOB: 06-24-1931 DOA: 08/17/2020 PCP: Hoyt Koch, MD   Chief Complaint  Patient presents with  . Abdominal Pain  Brief Narrative: 87 year old female with history of chronic hypoxic respiratory failure on 2 L nasal cannula secondary to ILD/pulmonary fibrosis,rheumatoid arthritis on chronic prednisone, CAD/CHF, dyslipidemia, history of DVT, presented with nausea vomiting abdominal pain, onset 3 days prior to admission.Patient seen in the ED 2/7 reported having a bowel movement a week prior to admission in the ED found to have SBO, fecal impaction disimpacted x2 with enema, CCS was consulted for SBO and was admitted for further management. 2/8- started on CLD, but had episode of vomiting on 2/9 - had Xray abd that showed normal bowel gas pattern. She is followed closely by general surgery.  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 2/12: Multiple attempts for NG tube insertion has been failed-by nursing/surgeon as well as IR. 2/13: Patient with ongoing more abdominal pain significant tenderness and some confusion- s/p ex lap with detorsion of small intestine and transferred to ICU for postop ICU care Has been transferred out of ICU.  Bowel function improving 2/18- cld diet, G tube clamped  Subjective: Patient had nausea yesterday and also had bowel movements.  No flatus.  Denies vomiting.  G-tube is clamped.  Diet advanced to clear  liquid diet today.  Nasal cannula oxygen no new complaints. Abdomen sore is  present but controlled on medication.  Assessment & Plan:  SBO/torsion of the small bowel mesentery: s/p exlap,detorsion of small bowel and placement of gastrostomy tube 2/13 ( Dr Zenia Resides).  G-tube remains clamped, on clear liquid diet today.  Had a bowel movement yesterday.  Bowel function slowly returning, being managed by CCS.  Increase activity pain control.  Minimize narcotic use.  Hypotension postop in the setting of third spacing/Anaesthesia/dehydration.  Resolved.  Stress dose hydrocortisone transition to home dose prednisone  Acute on chronic hypoxic respiratory failure on 2 L [place]: Respiratory status is stable.  Continue supplemental oxygen.  Cont IS, ptot  ILD/pulmonary fibrosis/Rheumatoid arthritis on chronic prednisone, Ofev, CellCept/Remicade outpatient infusion: Resumed prednisone 10 mg 2/18 then from 2/19 home dose 5 mg. I had communicated to her Pulmonologist Dr Vaughan Browner and okay to hold her ILD meds for now.  Hx of CAD/CHF ( D) /Dyslipidemia: Remains euvolemic, no chest pain. Normally she takes Lasix 20 mg as needed for fluid or edema. She has not been taking aspirin and metoprolol at home prior to admission. Watch for fluid overload  UTI on admission UA grossly abnormal UA, repeat UA 2/12-grossly abnormal ( wbc>50,rbc>50, cloudy, LE trace) and urine culture >100,000 Pseudomonas aeruginosa and Enterococcus faecalis.  Seen by ID Dr Juleen China- discontinued antibiotics.  Denies any urinary symptoms.   Leukocytosis persistent, patient received ceftriaxone and comleted.  Antibiotics were discontinued as per ID.  Leukocytosis was trending up obtain procalcitonin which is negative, most likely from IV steroid.  WBC now downtrending, remains afebrile.   Recent Labs  Lab 08/24/20 0408  08/25/20 0807 08/26/20 0244 08/27/20 0448 08/28/20 0304  WBC 14.3* 18.5* 21.7* 18.8* 17.1*   History of DVT not on anticoagulation  due to history of falls. Continue Lovenox prophylaxis  Normocytic anemia stable hb at 10 gm Recent Labs  Lab 08/24/20 0408 08/25/20 0807 08/26/20 0244 08/27/20 0448 08/28/20 0304  HGB 10.3* 9.4* 10.1* 10.4* 10.0*  HCT 34.5* 30.1* 32.5* 32.9* 32.0*   Hypophosphatemia/hypomagnesemia, electrolytes improved.    Dehydration/ketosis in the urine, cont TPN until able to take p.o. well.  Goals of care full code.Multiple comorbidities,with significant pulmonary disease,chronic respiratory failure now with SBO-debilitated.palliative care was consulted.  Nutrition Status: Severe protein calorie malnourishment present.  Has been on TPN 2/12.  Pharmacy dietitian following. Nutrition Problem: Severe Malnutrition Etiology: chronic illness (ILD/pulmonary fibrosis) Signs/Symptoms: severe fat depletion,severe muscle depletion Interventions: Boost Breeze,Prostat  Diet Order            Diet clear liquid Room service appropriate? Yes; Fluid consistency: Thin  Diet effective now                DVT prophylaxis: enoxaparin (LOVENOX) injection 30 mg Start: 08/17/20 2200 Code Status:   Code Status: Full Code  Family Communication:  Her nephew has been updated previously and extensively. At this time awaiting on return of bowel fun. Discussed with the patient this morning at bedside-reports she has been speaking to her nephew.  Status is Inpatient Remains inpatient appropriate because:IV treatments appropriate due to intensity of illness or inability to take PO and Inpatient level of care appropriate due to severity of illness.  Dispo:The patient is from: Home            Anticipated d/c is to: tbd             Anticipated d/c date is: 2 days once she has return of bowel function tolerating diet            Patient currently is not medically stable to d/c.            Difficult to place patient No   Consultants: [facility] GI PALLIATIVE MEDICINE PCCM   Procedures: EX LAP  2/13  Culture/Microbiology Other culture-see note  Medications: Scheduled Meds: . (feeding supplement) PROSource Plus  30 mL Oral BID BM  . acetaminophen  650 mg Oral Q6H  . bisacodyl  10 mg Rectal Daily  . chlorhexidine  15 mL Mouth Rinse BID  . Chlorhexidine Gluconate Cloth  6 each Topical Daily  . docusate sodium  100 mg Oral BID  . enoxaparin (LOVENOX) injection  30 mg Subcutaneous Q24H  . feeding supplement  1 Container Oral BID BM  . insulin aspart  0-9 Units Subcutaneous Q6H  . mouth rinse  15 mL Mouth Rinse q12n4p  . [START ON 08/29/2020] predniSONE  5 mg Oral Q breakfast  . sodium chloride flush  10-40 mL Intracatheter Q12H   Continuous Infusions: . sodium chloride 1 mL (08/27/20 2216)  . methocarbamol (ROBAXIN) IV 500 mg (08/28/20 0654)  . TPN ADULT (ION) 60 mL/hr at 08/27/20 1718  . TPN ADULT (ION)      Antimicrobials: Anti-infectives (From admission, onward)   Start     Dose/Rate Route Frequency Ordered Stop   08/23/20 0930  ceFAZolin (ANCEF) IVPB 2g/100 mL premix  Status:  Discontinued       "And" Linked Group Details   2 g 200 mL/hr over 30 Minutes Intravenous On call to O.R. 08/23/20 0938 08/23/20 0841   08/23/20 0930  metroNIDAZOLE (  FLAGYL) IVPB 500 mg       "And" Linked Group Details   500 mg 100 mL/hr over 60 Minutes Intravenous On call to O.R. 08/23/20 0841 08/23/20 1027   08/23/20 0923  ceFAZolin (ANCEF) 2-4 GM/100ML-% IVPB  Status:  Discontinued       Note to Pharmacy: Enrigue Catena   : cabinet override      08/23/20 0923 08/23/20 0938   08/19/20 1000  dapsone tablet 100 mg  Status:  Discontinued       Note to Pharmacy: TAKE 1 TABLET(100 MG) BY MOUTH DAILY     100 mg Oral Daily 08/18/20 1350 08/22/20 0805   08/18/20 1000  cefTRIAXone (ROCEPHIN) 1 g in sodium chloride 0.9 % 100 mL IVPB  Status:  Discontinued        1 g 200 mL/hr over 30 Minutes Intravenous Every 24 hours 08/17/20 1414 08/24/20 1209   08/17/20 0615  cefTRIAXone (ROCEPHIN) 1 g in sodium  chloride 0.9 % 100 mL IVPB        1 g 200 mL/hr over 30 Minutes Intravenous  Once 08/17/20 0605 08/17/20 0801     Objective: Vitals: Today's Vitals   08/27/20 2128 08/28/20 0500 08/28/20 0651 08/28/20 0800  BP: 111/64  106/61   Pulse: 81  80   Resp: 20  (!) 22   Temp: 98.7 F (37.1 C)  98.4 F (36.9 C)   TempSrc: Oral  Oral   SpO2: 97%  99% (!) 2%  Weight:  54.8 kg    Height:      PainSc:    0-No pain    Intake/Output Summary (Last 24 hours) at 08/28/2020 1304 Last data filed at 08/28/2020 0915 Gross per 24 hour  Intake 2200.97 ml  Output 950 ml  Net 1250.97 ml   Filed Weights   08/17/20 0326 08/27/20 0500 08/28/20 0500  Weight: 44.5 kg 54.1 kg 54.8 kg   Weight change: 0.7 kg  Intake/Output from previous day: 02/17 0701 - 02/18 0700 In: 2201 [P.O.:180; I.V.:1393.3; IV Piggyback:627.7] Out: 350 [Urine:350] Intake/Output this shift: Total I/O In: -  Out: 750 [Urine:750] Filed Weights   08/17/20 0326 08/27/20 0500 08/28/20 0500  Weight: 44.5 kg 54.1 kg 54.8 kg   Examination: General exam: AAOx3, frail, older than stated age,weak appearing. HEENT:Oral mucosa moist, Ear/Nose WNL grossly, dentition normal. Respiratory system: bilaterally diminished, crackles + at base-chronic,no use of accessory muscle Cardiovascular system: S1 & S2 +, No JVD,. Gastrointestinal system: Abdomen soft, NT, surgical site c/d/i staples intact. G tube+,BS+ Nervous System:Alert, awake, moving extremities and grossly nonfocal Extremities: No edema, distal peripheral pulses palpable.  Skin: No rashes,no icterus. MSK: Normal muscle bulk,tone, power  Data Reviewed: I have personally reviewed following labs and imaging studies CBC: Recent Labs  Lab 08/22/20 0634 08/23/20 0613 08/24/20 0408 08/25/20 0807 08/26/20 0244 08/27/20 0448 08/28/20 0304  WBC 16.6*   < > 14.3* 18.5* 21.7* 18.8* 17.1*  NEUTROABS 12.5*  --  11.8*  --   --   --   --   HGB 12.2   < > 10.3* 9.4* 10.1* 10.4* 10.0*   HCT 40.4   < > 34.5* 30.1* 32.5* 32.9* 32.0*  MCV 101.0*   < > 104.9* 99.7 98.8 97.3 97.9  PLT 262   < > 157 145* 160 195 206   < > = values in this interval not displayed.   Basic Metabolic Panel: Recent Labs  Lab 08/24/20 0408 08/25/20 0231 08/26/20 0244 08/27/20 0448 08/28/20  0304  NA 141 137 138 138 137  K 4.1 4.2 3.7 3.4* 4.1  CL 108 104 102 102 101  CO2 25 26 28 29 28   GLUCOSE 187* 159* 143* 102* 109*  BUN 16 14 14 18 17   CREATININE 0.46 0.43* 0.44 0.34* 0.43*  CALCIUM 7.8* 8.2* 8.1* 8.5* 8.2*  MG 1.9 1.6* 2.0 1.8 2.0  PHOS 2.3* 2.1* 2.7 2.2* 3.5   GFR: Estimated Creatinine Clearance: 42.9 mL/min (A) (by C-G formula based on SCr of 0.43 mg/dL (L)). Liver Function Tests: Recent Labs  Lab 08/23/20 0613 08/24/20 0408 08/25/20 0231 08/26/20 0244 08/27/20 0448  AST 20 18 15 16 27   ALT 11 10 10 12 22   ALKPHOS 55 44 45 51 59  BILITOT 0.7 0.6 0.6 0.6 0.5  PROT 5.9* 5.1* 4.9* 5.0* 4.8*  ALBUMIN 2.8* 2.4* 2.2* 2.2* 2.0*   No results for input(s): LIPASE, AMYLASE in the last 168 hours. No results for input(s): AMMONIA in the last 168 hours. Coagulation Profile: No results for input(s): INR, PROTIME in the last 168 hours. Cardiac Enzymes: No results for input(s): CKTOTAL, CKMB, CKMBINDEX, TROPONINI in the last 168 hours. BNP (last 3 results) Recent Labs    06/03/20 1001  PROBNP 100   HbA1C: No results for input(s): HGBA1C in the last 72 hours. CBG: Recent Labs  Lab 08/27/20 1136 08/27/20 1647 08/28/20 0026 08/28/20 0601 08/28/20 1125  GLUCAP 133* 162* 112* 119* 115*   Lipid Profile: No results for input(s): CHOL, HDL, LDLCALC, TRIG, CHOLHDL, LDLDIRECT in the last 72 hours. Thyroid Function Tests: No results for input(s): TSH, T4TOTAL, FREET4, T3FREE, THYROIDAB in the last 72 hours. Anemia Panel: No results for input(s): VITAMINB12, FOLATE, FERRITIN, TIBC, IRON, RETICCTPCT in the last 72 hours. Sepsis Labs: Recent Labs  Lab 08/26/20 0450  08/27/20 0448 08/28/20 0304  PROCALCITON 0.19 <0.10 <0.10    Recent Results (from the past 240 hour(s))  Culture, Urine     Status: Abnormal   Collection Time: 08/21/20  5:40 PM   Specimen: Urine, Clean Catch  Result Value Ref Range Status   Specimen Description   Final    URINE, CLEAN CATCH Performed at [facility], [place] [address], [place], [place] [address]    Special Requests   Final    NONE Performed at [facility], [place] [address], [place], [place] [address]    Culture (A)  Final    >=100,000 COLONIES/mL PSEUDOMONAS AERUGINOSA >=100,000 COLONIES/mL ENTEROCOCCUS FAECALIS    Report Status 08/25/2020 FINAL  Final   Organism ID, Bacteria PSEUDOMONAS AERUGINOSA (A)  Final   Organism ID, Bacteria ENTEROCOCCUS FAECALIS (A)  Final      Susceptibility   Enterococcus faecalis - MIC*    AMPICILLIN <=2 SENSITIVE Sensitive     NITROFURANTOIN <=16 SENSITIVE Sensitive     VANCOMYCIN 1 SENSITIVE Sensitive     * >=100,000 COLONIES/mL ENTEROCOCCUS FAECALIS   Pseudomonas aeruginosa - MIC*    CEFTAZIDIME 4 SENSITIVE Sensitive     CIPROFLOXACIN <=0.25 SENSITIVE Sensitive     GENTAMICIN 2 SENSITIVE Sensitive     IMIPENEM 1 SENSITIVE Sensitive     PIP/TAZO <=4 SENSITIVE Sensitive     * >=100,000 COLONIES/mL PSEUDOMONAS AERUGINOSA  MRSA PCR Screening     Status: Abnormal   Collection Time: 08/23/20  1:27 PM   Specimen: Nasal Mucosa; Nasopharyngeal  Result Value Ref Range Status   MRSA by PCR POSITIVE (A) NEGATIVE Final    Comment:  The GeneXpert MRSA Assay (FDA approved for NASAL specimens only), is one component of a comprehensive MRSA colonization surveillance program. It is not intended to diagnose MRSA infection nor to guide or monitor treatment for MRSA infections. RESULT CALLED TO, READ BACK BY AND VERIFIED WITH: CARTER,K RN @1459  ON 08/23/20 JACKSON,K Performed at [facility], [place] [address], [place],  [place] [address]      Radiology Studies: No results found.   LOS: 11 days   Antonieta Pert, MD [facility]  08/28/2020, 1:04 PM

## 2020-08-28 NOTE — Progress Notes (Signed)
Shift Summary:   Seen by surgery during this shift and advanced to clear liquid diet. Patient ate half of soup provided with lunch, drunk one cup of water, and drunk some sips of apple juice with pro-source. No nausea/vommiting. Gtube remains clamped.Patient endorses feeling fine. Remained alert and oriented throughout this shift. Remained on 2-L of nasal cannula with RR even and unlabored. Remained on pure-wick during this shift.  Received schedule medications per MAR. Vitals signs remained stable during this shift.   POC: Per MD note discharge once bowel function returns and tolerates advancement of diet.

## 2020-08-28 NOTE — Progress Notes (Signed)
Nutrition Follow-up  DOCUMENTATION CODES:   Severe malnutrition in context of chronic illness,Underweight  INTERVENTION:  - continue TPN per Pharmacist. - will monitor for TPN weaning.  - will order Boost Breeze BID, each supplement provides 250 kcal and 9 grams of protein. - will order 30 ml Prosource Plus BID, each supplement provides 100 kcal and 15 grams protein.  - continue to advance diet as medically feasible.  NUTRITION DIAGNOSIS:   Severe Malnutrition related to chronic illness (ILD/pulmonary fibrosis) as evidenced by severe fat depletion,severe muscle depletion. -ongoing  GOAL:   Patient will meet greater than or equal to 90% of their needs -met with TPN  MONITOR:   PO intake,Supplement acceptance,Diet advancement,Labs,Weight trends,Other (Comment) (TPN)  ASSESSMENT:   87 year old female with history of chronic respiratory failure on 2 L O2 secondary to ILD/pulmonary fibrosis, RA on chronic prednisone, CAD/CHF, HLD, history of DVT who presented with nausea, vomiting, abdominal pain over the past 3 days. Pt found to have fecal impaction s/p disimpaction x 2 with enema and admitted for SBO.  Significant Events: 2/7- admission 2/9- initial RD assessment 2/10- episode of vomiting; made NPO 2/12- double lumen PICC placed in R brachial; TPN initiation 2/13- TPN increased to goal rate of 60 ml/hr   Diet advanced from NPO to CLD today at ~0800. Patient laying in bed with no family or visitors present at this time. She reported having ice chips and that she tolerated these well. She had not had any other liquids as of RD visit ~1 hour ago.   She continues with TPN at goal rate of 60 ml/hr which is providing 1267 kcal on non-lipid days and 1454 kcal on lipid days; 72 grams protein every day.   Weight +23 lb compared to admission weight. Weight had been stable 01/03/20-07/21/20 and then down as of 2/7. Current weight is above previously stable weight. Non information documented  in the edema section of flow sheet.  Surgery team's note from this AM states bowel function is improving and plan is to leave G-tube clamped at this time.      Labs reviewed; CBGs: 112, 119, 115 mg/dl, creatinine: 0.43 mg/dl, Ca: 8.2 mg/dl.  Medications reviewed; 100 mg colace BID, sliding scale novolog, 30 mmol IV KPhox x1 run 2/17, deltasone taper.   Diet Order:   Diet Order            Diet clear liquid Room service appropriate? Yes; Fluid consistency: Thin  Diet effective now                 EDUCATION NEEDS:   Education needs have been addressed  Skin:  Skin Assessment: Skin Integrity Issues: Skin Integrity Issues:: Incisions Incisions: (closed) abdomen from 2/13  Last BM:  2/17 (type 6 x1)  Height:   Ht Readings from Last 1 Encounters:  08/17/20 5' 4.5" (1.638 m)    Weight:   Wt Readings from Last 1 Encounters:  08/28/20 54.8 kg     Estimated Nutritional Needs:  Kcal:  1500-1700 Protein:  70-85 grams Fluid:  >/= 1.4 L     Jarome Matin, MS, RD, LDN, CNSC Inpatient Clinical Dietitian RD pager # available in [place]  After hours/weekend pager # available in [facility]

## 2020-08-28 NOTE — Progress Notes (Signed)
PHARMACY - TOTAL PARENTERAL NUTRITION CONSULT NOTE   Indication: Small bowel obstruction  Patient Measurements: Height: 5' 4.5" (163.8 cm) Weight: 54.8 kg (120 lb 13 oz) IBW/kg (Calculated) : 55.85 TPN AdjBW (KG): 44.5 Body mass index is 20.42 kg/m.  Assessment: 87 yo female with persistent SBO that may be result of gallstone ileus. Multiple attempts at NGT have failed; per CCS, at this time no further options besides doing surgery although high risk. She is now s/p exploratory laparotomy, detorsion of small bowel, placement of Stamm gastrostomy tube on 08/23/20.  Glucose / Insulin: sSSI (used 3 units in 24 hrs) -  CBG (goal <150): 109-162 - note patient on chronic steroids currently on solucortef 100mg  q12 >> prednisone Electrolytes: K 4.1, Phos 3.5, Mag 2, CorrCa 9.80; CL and CO2 wnl Renal: SCr low LFTs / TGs: LFTs, TG WNL Prealbumin / albumin: both low and decreased from prior Intake / Output: clamping trial 2/17; I/O +1851 MIVF:  NS at [facility] Imaging: 2/11 SBO per CT Surgeries / Procedures: -  2/13 ex lap, detorsion of small bowel, placement of G- tube  Central access: 2/12 - PICC TPN start date: 2/12  Nutritional Goals (per RD recommendation on 2/11): Severe malnutrition kCal: 1500-1700, Protein: 70-85, Fluid: >= 1.4L  Note that currently there is a Lipid emulsion shortage so SMOFlipid will only be added on MWF for appropriate patients  Goal TPN rate MWF with lipid (30gm/L)  is 60 mL/hr (provides 72 g of protein and 1699 kcals per day)  Goal TPN rate TuThSatSun without lipid is 60 ml/hr (provides 72g protein and 1267 kcal)   Current Nutrition:  - Advancing to CLD  - TPN at 60 ml/hr  Plan:   At 1800:  - Continue TPN at goal rate 60 ml/hr - Electrolytes in TPN:   67mEq/L of Na  Decrease to 50 mEq/L of K   79mEq/L of Ca   51mEq/L of Mg  Decrease to 15 mmol/L of Phos  Cl:Ac ratio 1:1 - Add standard MVI and trace elements to TPN - continue SSI with q6 hr  CBG checks - Pepcid 20mg  per day in TPN - Monitor TPN labs on Mon/Thurs and as needed - Bmet, Mg, Phos on 2/19  Kaydi Kley P  08/28/2020,7:56 AM

## 2020-08-28 NOTE — Progress Notes (Signed)
[facility] Progress Note  5 Days Post-Op  Subjective: CC-  Abdomen sore but pain well controlled. Feels about the same as yesterday. States that she had a little nausea yesterday afternoon but it passed. No emesis. No flatus. Small, soft BM x2 yesterday. Tolerating sips of clears. G tube remains clamped.  Objective: Vital signs in last 24 hours: Temp:  [98.4 F (36.9 C)-99.1 F (37.3 C)] 98.4 F (36.9 C) (02/18 0651) Pulse Rate:  [80-90] 80 (02/18 0651) Resp:  [20-22] 22 (02/18 0651) BP: (100-111)/(58-64) 106/61 (02/18 0651) SpO2:  [97 %-99 %] 99 % (02/18 0651) Weight:  [54.8 kg] 54.8 kg (02/18 0500) Last BM Date: 08/26/20  Intake/Output from previous day: 02/17 0701 - 02/18 0700 In: 2201 [P.O.:180; I.V.:1393.3; IV Piggyback:627.7] Out: 350 [Urine:350] Intake/Output this shift: No intake/output data recorded.  PE: Gen:  Alert, NAD, pleasant Pulm: mild tachypnea Abd: Soft, minimal distension, +BS, midline incision cdi with staples present and no erythema or drainage, G tube site cdi and tube is clamped  Lab Results:  Recent Labs    08/27/20 0448 08/28/20 0304  WBC 18.8* 17.1*  HGB 10.4* 10.0*  HCT 32.9* 32.0*  PLT 195 206   BMET Recent Labs    08/27/20 0448 08/28/20 0304  NA 138 137  K 3.4* 4.1  CL 102 101  CO2 29 28  GLUCOSE 102* 109*  BUN 18 17  CREATININE 0.34* 0.43*  CALCIUM 8.5* 8.2*   PT/INR No results for input(s): LABPROT, INR in the last 72 hours. CMP     Component Value Date/Time   NA 137 08/28/2020 0304   NA 138 04/29/2020 0948   K 4.1 08/28/2020 0304   CL 101 08/28/2020 0304   CO2 28 08/28/2020 0304   GLUCOSE 109 (H) 08/28/2020 0304   BUN 17 08/28/2020 0304   BUN 11 04/29/2020 0948   CREATININE 0.43 (L) 08/28/2020 0304   CREATININE 0.74 11/30/2015 1327   CALCIUM 8.2 (L) 08/28/2020 0304   PROT 4.8 (L) 08/27/2020 0448   PROT 6.5 04/29/2020 0948   ALBUMIN 2.0 (L) 08/27/2020 0448   ALBUMIN 3.8 04/29/2020 0948   AST 27  08/27/2020 0448   ALT 22 08/27/2020 0448   ALKPHOS 59 08/27/2020 0448   BILITOT 0.5 08/27/2020 0448   BILITOT 0.7 04/29/2020 0948   GFRNONAA >60 08/28/2020 0304   GFRAA 69 04/29/2020 0948   Lipase     Component Value Date/Time   LIPASE 27 08/17/2020 0410       Studies/Results: No results found.  Anti-infectives: Anti-infectives (From admission, onward)   Start     Dose/Rate Route Frequency Ordered Stop   08/23/20 0930  ceFAZolin (ANCEF) IVPB 2g/100 mL premix  Status:  Discontinued       "And" Linked Group Details   2 g 200 mL/hr over 30 Minutes Intravenous On call to O.R. 08/23/20 0102 08/23/20 0841   08/23/20 0930  metroNIDAZOLE (FLAGYL) IVPB 500 mg       "And" Linked Group Details   500 mg 100 mL/hr over 60 Minutes Intravenous On call to O.R. 08/23/20 0841 08/23/20 1027   08/23/20 0923  ceFAZolin (ANCEF) 2-4 GM/100ML-% IVPB  Status:  Discontinued       Note to Pharmacy: Enrigue Catena   : cabinet override      08/23/20 0923 08/23/20 0938   08/19/20 1000  dapsone tablet 100 mg  Status:  Discontinued       Note to Pharmacy: TAKE 1 TABLET(100 MG) BY  MOUTH DAILY     100 mg Oral Daily 08/18/20 1350 08/22/20 0805   08/18/20 1000  cefTRIAXone (ROCEPHIN) 1 g in sodium chloride 0.9 % 100 mL IVPB  Status:  Discontinued        1 g 200 mL/hr over 30 Minutes Intravenous Every 24 hours 08/17/20 1414 08/24/20 1209   08/17/20 0615  cefTRIAXone (ROCEPHIN) 1 g in sodium chloride 0.9 % 100 mL IVPB        1 g 200 mL/hr over 30 Minutes Intravenous  Once 08/17/20 0605 08/17/20 0801       Assessment/Plan Pulmonary fibrosis/interstitial lung disease Rheumatoid arthritis - cellcept/Remicade/predinsone - hydrocortisone 100mg  daily IV currently Hx CAD/CHF Hx of DVT  Anemia Severeprotein-caloriemalnutrition- prealbumin 6.0 Leukocytosis 14.6>>16.9>>14.3>>14.3>>21.7>>18.8>>17.1 UTI > 100K Pseudomonas  SBO/Torsion of small bowel mesentary S/p Exploratory laparotomy, detorsion of  small bowel, placement of Stamm Gastrostomy tube, 08/23/20, Michaelle Birks - POD#5 - Advance to clear liquid tray. Keep G tube clamped. If patient becomes distended or complains of nausea then return G tube to gravity drainage.  - Continue TPN until reliably tolerating diet - Continue PT/OT, mobilize.   FEN: TPN; clamp G-tube, CLD  ID: rocephin 2/7- 2/12 DVT: Lovenox Follow up: Dr. Zenia Resides Foley: out    LOS: 11 days    Wellington Hampshire, [facility] 08/28/2020, 7:58 AM Please see Amion for pager number during day hours 7:00am-4:30pm

## 2020-08-29 DIAGNOSIS — J9621 Acute and chronic respiratory failure with hypoxia: Secondary | ICD-10-CM | POA: Diagnosis not present

## 2020-08-29 LAB — BASIC METABOLIC PANEL
Anion gap: 7 (ref 5–15)
BUN: 16 mg/dL (ref 8–23)
CO2: 29 mmol/L (ref 22–32)
Calcium: 8.5 mg/dL — ABNORMAL LOW (ref 8.9–10.3)
Chloride: 100 mmol/L (ref 98–111)
Creatinine, Ser: 0.36 mg/dL — ABNORMAL LOW (ref 0.44–1.00)
GFR, Estimated: >60 mL/min (ref >60)
Glucose, Bld: 99 mg/dL (ref 70–99)
Potassium: 4.1 mmol/L (ref 3.5–5.1)
Sodium: 136 mmol/L (ref 135–145)

## 2020-08-29 LAB — GLUCOSE, CAPILLARY
Glucose-Capillary: 111 mg/dL — ABNORMAL HIGH (ref 70–99)
Glucose-Capillary: 117 mg/dL — ABNORMAL HIGH (ref 70–99)
Glucose-Capillary: 128 mg/dL — ABNORMAL HIGH (ref 70–99)
Glucose-Capillary: 128 mg/dL — ABNORMAL HIGH (ref 70–99)
Glucose-Capillary: 140 mg/dL — ABNORMAL HIGH (ref 70–99)

## 2020-08-29 LAB — PHOSPHORUS: Phosphorus: 2.5 mg/dL (ref 2.5–4.6)

## 2020-08-29 LAB — MAGNESIUM: Magnesium: 1.7 mg/dL (ref 1.7–2.4)

## 2020-08-29 MED ORDER — TRAVASOL 10 % IV SOLN
INTRAVENOUS | Status: AC
  Filled 2020-08-29: qty 720

## 2020-08-29 MED ORDER — MAGNESIUM SULFATE 2 GM/50ML IV SOLN
2.0000 g | Freq: Once | INTRAVENOUS | Status: AC
  Administered 2020-08-29: 2 g via INTRAVENOUS
  Filled 2020-08-29: qty 50

## 2020-08-29 NOTE — Progress Notes (Signed)
PHARMACY - TOTAL PARENTERAL NUTRITION CONSULT NOTE   Indication: Small bowel obstruction  Patient Measurements: Height: 5' 4.5" (163.8 cm) Weight: 49.1 kg (108 lb 3.9 oz) IBW/kg (Calculated) : 55.85 TPN AdjBW (KG): 44.5 Body mass index is 18.29 kg/m.  Assessment: 87 yo female with persistent SBO that may be result of gallstone ileus. Multiple attempts at NGT have failed; per CCS, at this time no further options besides doing surgery although high risk. She is now s/p exploratory laparotomy, detorsion of small bowel, placement of Stamm gastrostomy tube on 08/23/20.  Glucose / Insulin: sSSI (used 2 units in 24 hrs) -  CBG (goal <150): 99-140 - note patient on chronic steroids currently on solucortef 100mg  q12 >> prednisone Electrolytes: K 4.1, Phos 2.5, Mag 1.7, CorrCa 10.1; CL and CO2 wnl Renal: SCr low LFTs / TGs: LFTs, TG WNL Prealbumin / albumin: both low and decreased from prior Intake / Output:  I/O -620 MIVF:  NS at [facility] GI Imaging: 2/11 SBO per CT Surgeries / Procedures: -  2/13 ex lap, detorsion of small bowel, placement of G- tube  Central access: 2/12 - PICC TPN start date: 2/12  Nutritional Goals (per RD recommendation on 2/11): Severe malnutrition kCal: 1500-1700, Protein: 70-85, Fluid: >= 1.4L  Note that currently there is a Lipid emulsion shortage so SMOFlipid will only be added on MWF for appropriate patients  Goal TPN rate MWF with lipid (30gm/L)  is 60 mL/hr (provides 72 g of protein and 1699 kcals per day)  Goal TPN rate TuThSatSun without lipid is 60 ml/hr (provides 72g protein and 1267 kcal)   Current Nutrition:  - Advancing to CLD  - TPN at 60 ml/hr   - 2/18: boost bid, prosource bid added  Plan:   Now:  - magnesium sulfate 2gm IV x1  At 1800:  - Continue TPN at goal rate 60 ml/hr - Electrolytes in TPN:   31mEq/L of Na  50 mEq/L of K   13mEq/L of Ca   5 mEq/L of Mg  Increase to 20 mmol/L of Phos  Cl:Ac ratio 1:1 - Add standard MVI  and trace elements to TPN - continue SSI with q6 hr CBG checks - Pepcid 20mg  per day in TPN - Monitor TPN labs on Mon/Thurs and as needed - Bmet, Mg, Phos on 2/20  [name] [name] P  08/29/2020,8:50 AM

## 2020-08-29 NOTE — Progress Notes (Signed)
PROGRESS NOTE    [name] [name]  SEG:[id number] DOB: August 20, 1930 DOA: 08/17/2020 PCP: Hoyt Koch, MD   Chief Complaint  Patient presents with  . Abdominal Pain  Brief Narrative: 87 year old female with history of chronic hypoxic respiratory failure on 2 L nasal cannula secondary to ILD/pulmonary fibrosis,rheumatoid arthritis on chronic prednisone, CAD/CHF, dyslipidemia, history of DVT, presented with nausea vomiting abdominal pain, onset 3 days prior to admission.Patient seen in the ED 2/7 reported having a bowel movement a week prior to admission in the ED found to have SBO, fecal impaction disimpacted x2 with enema, CCS was consulted for SBO and was admitted for further management. 2/8- started on CLD, but had episode of vomiting on 2/9 - had Xray abd that showed normal bowel gas pattern. She is followed closely by general surgery.  Again had episode of vomiting 2/10 am, surgery planned for enema.Nephew wanted to look into second opinion at [facility]- called  and discussed w/ Transfer center , they are at capacity- was called back by Hospitalist from [facility] and they have declined transfer, nephew made aware of.2/11-GI also consulted, repeat CT abdomen done that showed -progressive small bowel obstruction with significant gastric and small bowel distention, transition point associated with an intraluminal lesion in the mid distal ileum in the mid pelvis, most likely a large gallstone ileus. 2/12: Multiple attempts for NG tube insertion has been failed-by nursing/surgeon as well as IR. 2/13: Patient with ongoing more abdominal pain significant tenderness and some confusion- s/p ex lap with detorsion of small intestine and transferred to ICU for postop ICU care Has been transferred out of ICU.  Bowel function improving 2/18- cld diet, G tube clamped  Subjective: Seen and examined this morning.  Sleepy but arousable and had a nice conversation.  She states that she feels much relieved since he has  had 2 large bowel movements last night.  No abdominal pain or any other complaint.  Assessment & Plan:  SBO/torsion of the small bowel mesentery: s/p exlap,detorsion of small bowel and placement of gastrostomy tube 2/13 ( Dr Zenia Resides).  G-tube remains clamped since 08/28/2020, tolerating clear liquid diet, had couple of more bowel movements overnight.  CCS managing and advance diet to full liquid diet.   Hypotension postop in the setting of third spacing/Anaesthesia/dehydration.  Resolved.  Stress dose hydrocortisone transition to home dose prednisone  Acute on chronic hypoxic respiratory failure on 2 L [place]: Respiratory status is stable.  Continue supplemental oxygen 2 L which is her baseline.  Cont IS, ptot  ILD/pulmonary fibrosis/Rheumatoid arthritis on chronic prednisone, Ofev, CellCept/Remicade outpatient infusion: Resumed prednisone 10 mg 2/18 then from 2/19 home dose 5 mg. I had communicated to her Pulmonologist Dr Vaughan Browner and okay to hold her ILD meds for now.  Hx of CAD/CHF ( D) /Dyslipidemia: Remains euvolemic, no chest pain. Normally she takes Lasix 20 mg as needed for fluid or edema. She has not been taking aspirin and metoprolol at home prior to admission. Watch for fluid overload  UTI on admission UA grossly abnormal UA, repeat UA 2/12-grossly abnormal ( wbc>50,rbc>50, cloudy, LE trace) and urine culture >100,000 Pseudomonas aeruginosa and Enterococcus faecalis.  Seen by ID Dr Juleen China- discontinued antibiotics.  Denies any urinary symptoms.   Leukocytosis persistent, patient received ceftriaxone and comleted.  Antibiotics were discontinued as per ID.  Leukocytosis was trending up obtain procalcitonin which is negative, most likely from IV steroid.  WBC now downtrending, remains afebrile.    Recent Labs  Lab 08/24/20 0408 08/25/20  1540 08/26/20 0244 08/27/20 0448 08/28/20 0304  WBC 14.3* 18.5* 21.7* 18.8* 17.1*   History of DVT not on anticoagulation due to history of falls. Continue  Lovenox prophylaxis  Normocytic anemia stable hb at 10 gm Recent Labs  Lab 08/24/20 0408 08/25/20 0807 08/26/20 0244 08/27/20 0448 08/28/20 0304  HGB 10.3* 9.4* 10.1* 10.4* 10.0*  HCT 34.5* 30.1* 32.5* 32.9* 32.0*   Hypophosphatemia/hypomagnesemia, electrolytes improved.    Dehydration/ketosis in the urine, cont TPN until able to take p.o. well.  Goals of care full code.Multiple comorbidities,with significant pulmonary disease,chronic respiratory failure now with SBO-debilitated.palliative care was consulted.  Nutrition Status: Severe protein calorie malnourishment present.  Has been on TPN 2/12.  Pharmacy dietitian following. Nutrition Problem: Severe Malnutrition Etiology: chronic illness (ILD/pulmonary fibrosis) Signs/Symptoms: severe fat depletion,severe muscle depletion Interventions: Boost Breeze,Prostat  Diet Order            Diet full liquid Room service appropriate? Yes; Fluid consistency: Thin  Diet effective now                DVT prophylaxis: enoxaparin (LOVENOX) injection 40 mg Start: 08/28/20 2200 Code Status:   Code Status: Full Code  Family Communication:  Her nephew has been updated previously and extensively. At this time awaiting on return of bowel fun.  Status is Inpatient Remains inpatient appropriate because:IV treatments appropriate due to intensity of illness or inability to take PO and Inpatient level of care appropriate due to severity of illness.  Dispo:The patient is from: Home            Anticipated d/c is to: SNF            Anticipated d/c date is: 2 to 3 days            Patient currently is not medically stable to d/c.            Difficult to place patient No   Consultants: [facility] GI PALLIATIVE MEDICINE PCCM   Procedures: EX LAP 2/13  Culture/Microbiology Other culture-see note  Medications: Scheduled Meds: . (feeding supplement) PROSource Plus  30 mL Oral BID BM  . acetaminophen  650 mg Oral Q6H  . bisacodyl  10 mg Rectal  Daily  . chlorhexidine  15 mL Mouth Rinse BID  . Chlorhexidine Gluconate Cloth  6 each Topical Daily  . docusate sodium  100 mg Oral BID  . enoxaparin (LOVENOX) injection  40 mg Subcutaneous Q24H  . feeding supplement  1 Container Oral BID BM  . insulin aspart  0-9 Units Subcutaneous Q6H  . mouth rinse  15 mL Mouth Rinse q12n4p  . predniSONE  5 mg Oral Q breakfast  . sodium chloride flush  10-40 mL Intracatheter Q12H   Continuous Infusions: . sodium chloride 1 mL (08/27/20 2216)  . magnesium sulfate bolus IVPB 2 g (08/29/20 1206)  . methocarbamol (ROBAXIN) IV 500 mg (08/29/20 0526)  . TPN ADULT (ION) 60 mL/hr at 08/28/20 1744  . TPN ADULT (ION)      Antimicrobials: Anti-infectives (From admission, onward)   Start     Dose/Rate Route Frequency Ordered Stop   08/23/20 0930  ceFAZolin (ANCEF) IVPB 2g/100 mL premix  Status:  Discontinued       "And" Linked Group Details   2 g 200 mL/hr over 30 Minutes Intravenous On call to O.R. 08/23/20 0867 08/23/20 0841   08/23/20 0930  metroNIDAZOLE (FLAGYL) IVPB 500 mg       "And" Linked Group Details   500  mg 100 mL/hr over 60 Minutes Intravenous On call to O.R. 08/23/20 0841 08/23/20 1027   08/23/20 0923  ceFAZolin (ANCEF) 2-4 GM/100ML-% IVPB  Status:  Discontinued       Note to Pharmacy: Enrigue Catena   : cabinet override      08/23/20 0923 08/23/20 0938   08/19/20 1000  dapsone tablet 100 mg  Status:  Discontinued       Note to Pharmacy: TAKE 1 TABLET(100 MG) BY MOUTH DAILY     100 mg Oral Daily 08/18/20 1350 08/22/20 0805   08/18/20 1000  cefTRIAXone (ROCEPHIN) 1 g in sodium chloride 0.9 % 100 mL IVPB  Status:  Discontinued        1 g 200 mL/hr over 30 Minutes Intravenous Every 24 hours 08/17/20 1414 08/24/20 1209   08/17/20 0615  cefTRIAXone (ROCEPHIN) 1 g in sodium chloride 0.9 % 100 mL IVPB        1 g 200 mL/hr over 30 Minutes Intravenous  Once 08/17/20 0605 08/17/20 0801     Objective: Vitals: Today's Vitals   08/28/20 2030  08/28/20 2108 08/29/20 0412 08/29/20 0416  BP: 97/60  112/66   Pulse: 94  79   Resp: 18  20   Temp:   98 F (36.7 C)   TempSrc:   Oral   SpO2: 97%     Weight:    49.1 kg  Height:      PainSc:  (P) 0-No pain      Intake/Output Summary (Last 24 hours) at 08/29/2020 1304 Last data filed at 08/29/2020 0400 Gross per 24 hour  Intake 960 ml  Output 1150 ml  Net -190 ml   Filed Weights   08/27/20 0500 08/28/20 0500 08/29/20 0416  Weight: 54.1 kg 54.8 kg 49.1 kg   Weight change: -5.7 kg  Intake/Output from previous day: 02/18 0701 - 02/19 0700 In: 1280 [P.O.:320; I.V.:960] Out: 1900 [Urine:1900] Intake/Output this shift: No intake/output data recorded. Filed Weights   08/27/20 0500 08/28/20 0500 08/29/20 0416  Weight: 54.1 kg 54.8 kg 49.1 kg   Examination: General exam: Appears calm and comfortable but thin and frail Respiratory system: Clear to auscultation. Respiratory effort normal. Cardiovascular system: S1 & S2 heard, RRR. No JVD, murmurs, rubs, gallops or clicks. No pedal edema. Gastrointestinal system: Abdomen is nondistended, soft and slightly tender at the incision site. No organomegaly or masses felt. Normal bowel sounds heard.  G-tube noted which is clamped. Central nervous system: Alert and oriented. No focal neurological deficits. Extremities: Symmetric 5 x 5 power.   Data Reviewed: I have personally reviewed following labs and imaging studies CBC: Recent Labs  Lab 08/24/20 0408 08/25/20 0807 08/26/20 0244 08/27/20 0448 08/28/20 0304  WBC 14.3* 18.5* 21.7* 18.8* 17.1*  NEUTROABS 11.8*  --   --   --   --   HGB 10.3* 9.4* 10.1* 10.4* 10.0*  HCT 34.5* 30.1* 32.5* 32.9* 32.0*  MCV 104.9* 99.7 98.8 97.3 97.9  PLT 157 145* 160 195 332   Basic Metabolic Panel: Recent Labs  Lab 08/25/20 0231 08/26/20 0244 08/27/20 0448 08/28/20 0304 08/29/20 0400  NA 137 138 138 137 136  K 4.2 3.7 3.4* 4.1 4.1  CL 104 102 102 101 100  CO2 26 28 29 28 29   GLUCOSE 159*  143* 102* 109* 99  BUN 14 14 18 17 16   CREATININE 0.43* 0.44 0.34* 0.43* 0.36*  CALCIUM 8.2* 8.1* 8.5* 8.2* 8.5*  MG 1.6* 2.0 1.8 2.0 1.7  PHOS 2.1* 2.7 2.2* 3.5 2.5   GFR: Estimated Creatinine Clearance: 38.4 mL/min (A) (by C-G formula based on SCr of 0.36 mg/dL (L)). Liver Function Tests: Recent Labs  Lab 08/23/20 0613 08/24/20 0408 08/25/20 0231 08/26/20 0244 08/27/20 0448  AST 20 18 15 16 27   ALT 11 10 10 12 22   ALKPHOS 55 44 45 51 59  BILITOT 0.7 0.6 0.6 0.6 0.5  PROT 5.9* 5.1* 4.9* 5.0* 4.8*  ALBUMIN 2.8* 2.4* 2.2* 2.2* 2.0*   No results for input(s): LIPASE, AMYLASE in the last 168 hours. No results for input(s): AMMONIA in the last 168 hours. Coagulation Profile: No results for input(s): INR, PROTIME in the last 168 hours. Cardiac Enzymes: No results for input(s): CKTOTAL, CKMB, CKMBINDEX, TROPONINI in the last 168 hours. BNP (last 3 results) Recent Labs    06/03/20 1001  PROBNP 100   HbA1C: No results for input(s): HGBA1C in the last 72 hours. CBG: Recent Labs  Lab 08/28/20 1125 08/28/20 1609 08/29/20 0012 08/29/20 0639 08/29/20 1145  GLUCAP 115* 140* 111* 128* 140*   Lipid Profile: No results for input(s): CHOL, HDL, LDLCALC, TRIG, CHOLHDL, LDLDIRECT in the last 72 hours. Thyroid Function Tests: No results for input(s): TSH, T4TOTAL, FREET4, T3FREE, THYROIDAB in the last 72 hours. Anemia Panel: No results for input(s): VITAMINB12, FOLATE, FERRITIN, TIBC, IRON, RETICCTPCT in the last 72 hours. Sepsis Labs: Recent Labs  Lab 08/26/20 0450 08/27/20 0448 08/28/20 0304  PROCALCITON 0.19 <0.10 <0.10    Recent Results (from the past 240 hour(s))  Culture, Urine     Status: Abnormal   Collection Time: 08/21/20  5:40 PM   Specimen: Urine, Clean Catch  Result Value Ref Range Status   Specimen Description   Final    URINE, CLEAN CATCH Performed at [facility], [place] [address] [place], [place] [address]    Special Requests    Final    NONE Performed at [facility], [place] [address], [place], [place] [address]    Culture (A)  Final    >=100,000 COLONIES/mL PSEUDOMONAS AERUGINOSA >=100,000 COLONIES/mL ENTEROCOCCUS FAECALIS    Report Status 08/25/2020 FINAL  Final   Organism ID, Bacteria PSEUDOMONAS AERUGINOSA (A)  Final   Organism ID, Bacteria ENTEROCOCCUS FAECALIS (A)  Final      Susceptibility   Enterococcus faecalis - MIC*    AMPICILLIN <=2 SENSITIVE Sensitive     NITROFURANTOIN <=16 SENSITIVE Sensitive     VANCOMYCIN 1 SENSITIVE Sensitive     * >=100,000 COLONIES/mL ENTEROCOCCUS FAECALIS   Pseudomonas aeruginosa - MIC*    CEFTAZIDIME 4 SENSITIVE Sensitive     CIPROFLOXACIN <=0.25 SENSITIVE Sensitive     GENTAMICIN 2 SENSITIVE Sensitive     IMIPENEM 1 SENSITIVE Sensitive     PIP/TAZO <=4 SENSITIVE Sensitive     * >=100,000 COLONIES/mL PSEUDOMONAS AERUGINOSA  MRSA PCR Screening     Status: Abnormal   Collection Time: 08/23/20  1:27 PM   Specimen: Nasal Mucosa; Nasopharyngeal  Result Value Ref Range Status   MRSA by PCR POSITIVE (A) NEGATIVE Final    Comment:        The GeneXpert MRSA Assay (FDA approved for NASAL specimens only), is one component of a comprehensive MRSA colonization surveillance program. It is not intended to diagnose MRSA infection nor to guide or monitor treatment for MRSA infections. RESULT CALLED TO, READ BACK BY AND VERIFIED WITH: CARTER,K RN @1459  ON 08/23/20 JACKSON,K Performed at [facility] [place]  [address] [place], [place] [address]      Radiology Studies: No results found.   LOS: 12 days   Darliss Cheney, MD [facility]  08/29/2020, 1:04 PM

## 2020-08-29 NOTE — Plan of Care (Signed)

## 2020-08-29 NOTE — Progress Notes (Signed)
Patient had 2 large BM this shift, G- tube dressing was changed due to being soiled, no c/o nausea, or pain,no S/S of distress noted will continue present plan of care.

## 2020-08-29 NOTE — Progress Notes (Signed)
6 Days Post-Op   Subjective/Chief Complaint: Sleepy but arousable  No complaints    Objective: Vital signs in last 24 hours: Temp:  [98 F (36.7 C)-98.9 F (37.2 C)] 98 F (36.7 C) (02/19 0412) Pulse Rate:  [79-94] 79 (02/19 0412) Resp:  [18-20] 20 (02/19 0412) BP: (97-112)/(60-66) 112/66 (02/19 0412) SpO2:  [97 %] 97 % (02/18 2030) Weight:  [49.1 kg] 49.1 kg (02/19 0416) Last BM Date: 08/28/20  Intake/Output from previous day: 02/18 0701 - 02/19 0700 In: 1280 [P.O.:320; I.V.:960] Out: 1900 [Urine:1900] Intake/Output this shift: No intake/output data recorded.   Gen: Alert, sleepy but arousable  Pulm:WOB normal  Abd: Soft,minimal distension, +BS, midline incision cdi with staples present and no erythema or drainage, G tube site with scant drainage clear  and tube is clamped Lab Results:  Recent Labs    08/27/20 0448 08/28/20 0304  WBC 18.8* 17.1*  HGB 10.4* 10.0*  HCT 32.9* 32.0*  PLT 195 206   BMET Recent Labs    08/28/20 0304 08/29/20 0400  NA 137 136  K 4.1 4.1  CL 101 100  CO2 28 29  GLUCOSE 109* 99  BUN 17 16  CREATININE 0.43* 0.36*  CALCIUM 8.2* 8.5*   PT/INR No results for input(s): LABPROT, INR in the last 72 hours. ABG No results for input(s): PHART, HCO3 in the last 72 hours.  Invalid input(s): PCO2, PO2  Studies/Results: No results found.  Anti-infectives: Anti-infectives (From admission, onward)   Start     Dose/Rate Route Frequency Ordered Stop   08/23/20 0930  ceFAZolin (ANCEF) IVPB 2g/100 mL premix  Status:  Discontinued       "And" Linked Group Details   2 g 200 mL/hr over 30 Minutes Intravenous On call to O.R. 08/23/20 2355 08/23/20 0841   08/23/20 0930  metroNIDAZOLE (FLAGYL) IVPB 500 mg       "And" Linked Group Details   500 mg 100 mL/hr over 60 Minutes Intravenous On call to O.R. 08/23/20 0841 08/23/20 1027   08/23/20 0923  ceFAZolin (ANCEF) 2-4 GM/100ML-% IVPB  Status:  Discontinued       Note to Pharmacy: Enrigue Catena   : cabinet override      08/23/20 0923 08/23/20 0938   08/19/20 1000  dapsone tablet 100 mg  Status:  Discontinued       Note to Pharmacy: TAKE 1 TABLET(100 MG) BY MOUTH DAILY     100 mg Oral Daily 08/18/20 1350 08/22/20 0805   08/18/20 1000  cefTRIAXone (ROCEPHIN) 1 g in sodium chloride 0.9 % 100 mL IVPB  Status:  Discontinued        1 g 200 mL/hr over 30 Minutes Intravenous Every 24 hours 08/17/20 1414 08/24/20 1209   08/17/20 0615  cefTRIAXone (ROCEPHIN) 1 g in sodium chloride 0.9 % 100 mL IVPB        1 g 200 mL/hr over 30 Minutes Intravenous  Once 08/17/20 0605 08/17/20 0801      Assessment/Plan: s/p Procedure(s): EXPLORATORY LAPAROTOMY small bowel resection gastrostomy tube placement (N/A)   LOS: 12 days   Pulmonary fibrosis/interstitial lung disease Rheumatoid arthritis - cellcept/Remicade/predinsone - hydrocortisone 100mg dailyIV currently Hx CAD/CHF Hx of DVT  Anemia Severeprotein-caloriemalnutrition- prealbumin 6.0 Leukocytosis 14.6>>16.9>>14.3>>14.3>>21.7>>18.8>>17.1 UTI > 100K Pseudomonas  SBO/Torsion of small bowel mesentary S/pExploratory laparotomy, detorsion of small bowel, placement of Stamm Gastrostomy tube, 08/23/20, Michaelle Birks - POD#6 - Advance to full  liquid tray. Keep G tube clamped. If patient becomes distended or complains of nausea  then return G tube to gravity drainage.  - Continue TPN until reliably tolerating diet - Continue PT/OT, mobilize.  FEN: TPN;clampG-tube, CLD ID: rocephin 2/7- 2/12 DVT: Lovenox Follow up: Dr. Zenia Resides Foley:out  Joyice Faster Anthem Frazer MD  08/29/2020

## 2020-08-30 DIAGNOSIS — K56609 Unspecified intestinal obstruction, unspecified as to partial versus complete obstruction: Secondary | ICD-10-CM | POA: Diagnosis not present

## 2020-08-30 LAB — CBC
HCT: 32.3 % — ABNORMAL LOW (ref 36.0–46.0)
Hemoglobin: 10.3 g/dL — ABNORMAL LOW (ref 12.0–15.0)
MCH: 31 pg (ref 26.0–34.0)
MCHC: 31.9 g/dL (ref 30.0–36.0)
MCV: 97.3 fL (ref 80.0–100.0)
Platelets: 228 10*3/uL (ref 150–400)
RBC: 3.32 MIL/uL — ABNORMAL LOW (ref 3.87–5.11)
RDW: 15.3 % (ref 11.5–15.5)
WBC: 19.9 10*3/uL — ABNORMAL HIGH (ref 4.0–10.5)
nRBC: 0 % (ref 0.0–0.2)

## 2020-08-30 LAB — BASIC METABOLIC PANEL
Anion gap: 6 (ref 5–15)
BUN: 17 mg/dL (ref 8–23)
CO2: 30 mmol/L (ref 22–32)
Calcium: 8.3 mg/dL — ABNORMAL LOW (ref 8.9–10.3)
Chloride: 97 mmol/L — ABNORMAL LOW (ref 98–111)
Creatinine, Ser: 0.47 mg/dL (ref 0.44–1.00)
GFR, Estimated: >60 mL/min (ref >60)
Glucose, Bld: 113 mg/dL — ABNORMAL HIGH (ref 70–99)
Potassium: 3.9 mmol/L (ref 3.5–5.1)
Sodium: 133 mmol/L — ABNORMAL LOW (ref 135–145)

## 2020-08-30 LAB — PHOSPHORUS: Phosphorus: 2.9 mg/dL (ref 2.5–4.6)

## 2020-08-30 LAB — GLUCOSE, CAPILLARY
Glucose-Capillary: 128 mg/dL — ABNORMAL HIGH (ref 70–99)
Glucose-Capillary: 144 mg/dL — ABNORMAL HIGH (ref 70–99)
Glucose-Capillary: 99 mg/dL (ref 70–99)

## 2020-08-30 LAB — MAGNESIUM: Magnesium: 1.8 mg/dL (ref 1.7–2.4)

## 2020-08-30 MED ORDER — TRAVASOL 10 % IV SOLN
INTRAVENOUS | Status: AC
  Filled 2020-08-30: qty 720

## 2020-08-30 MED ORDER — MAGNESIUM SULFATE 2 GM/50ML IV SOLN
2.0000 g | Freq: Once | INTRAVENOUS | Status: AC
  Administered 2020-08-30: 2 g via INTRAVENOUS
  Filled 2020-08-30: qty 50

## 2020-08-30 NOTE — Progress Notes (Signed)
PROGRESS NOTE    [name] [name]  VQM:[id number] DOB: [birth year] DOA: 08/17/2020 PCP: Hoyt Koch, MD   Chief Complaint  Patient presents with  . Abdominal Pain  Brief Narrative: 87 year old female with history of chronic hypoxic respiratory failure on 2 L nasal cannula secondary to ILD/pulmonary fibrosis,rheumatoid arthritis on chronic prednisone, CAD/CHF, dyslipidemia, history of DVT, presented with nausea vomiting abdominal pain, onset 3 days prior to admission.Patient seen in the ED 2/7 reported having a bowel movement a week prior to admission in the ED found to have SBO, fecal impaction disimpacted x2 with enema, CCS was consulted for SBO and was admitted for further management. 2/8- started on CLD, but had episode of vomiting on 2/9 - had Xray abd that showed normal bowel gas pattern. She is followed closely by general surgery.  Again had episode of vomiting 2/10 am, surgery planned for enema.Nephew wanted to look into second opinion at [facility]- called  and discussed w/ Transfer center , they are at capacity- was called back by Hospitalist from [facility] and they have declined transfer, nephew made aware of.2/11-GI also consulted, repeat CT abdomen done that showed -progressive small bowel obstruction with significant gastric and small bowel distention, transition point associated with an intraluminal lesion in the mid distal ileum in the mid pelvis, most likely a large gallstone ileus. 2/12: Multiple attempts for NG tube insertion has been failed-by nursing/surgeon as well as IR. 2/13: Patient with ongoing more abdominal pain significant tenderness and some confusion- s/p ex lap with detorsion of small intestine and transferred to ICU for postop ICU care Has been transferred out of ICU.  Bowel function improving 2/18- cld diet, G tube clamped  Subjective: Seen and examined.  Very much alert and oriented and motivated and in good mood.  Denies any complaint.  Tells me that she has not had any  bowel movement in last 24 hours but passing gas and tolerating full liquid diet.  Assessment & Plan:  SBO/torsion of the small bowel mesentery: s/p exlap,detorsion of small bowel and placement of gastrostomy tube 2/13 ( Dr Zenia Resides).  G-tube remains clamped since 08/28/2020, tolerating clear full liquid diet.  No bowel movement in last 24 hours.  Passing flatus.  CCS managing and advance diet to soft diet today.  Also remains on TPN which will be started to wean starting tomorrow.  Hypotension postop in the setting of third spacing/Anaesthesia/dehydration.  Resolved.  Stress dose hydrocortisone transition to home dose prednisone  Acute on chronic hypoxic respiratory failure on 2 L [place]: Respiratory status is stable.  This morning she was on room air and doing well.  ILD/pulmonary fibrosis/Rheumatoid arthritis on chronic prednisone, Ofev, CellCept/Remicade outpatient infusion: Resumed prednisone 10 mg 2/18 then from 2/19 home dose 5 mg. I had communicated to her Pulmonologist Dr Vaughan Browner and okay to hold her ILD meds for now.  Hx of CAD/CHF ( D) /Dyslipidemia: Remains euvolemic, no chest pain. Normally she takes Lasix 20 mg as needed for fluid or edema. She has not been taking aspirin and metoprolol at home prior to admission. Watch for fluid overload  UTI on admission UA grossly abnormal UA, repeat UA 2/12-grossly abnormal ( wbc>50,rbc>50, cloudy, LE trace) and urine culture >100,000 Pseudomonas aeruginosa and Enterococcus faecalis.  Seen by ID Dr Juleen China- discontinued antibiotics.  Denies any urinary symptoms.   Leukocytosis persistent, patient received ceftriaxone and completed.  Antibiotics were discontinued as per ID.  Leukocytosis slowly trending up but she is afebrile.  Surgery's plan is to repeat  CT abdomen if she does not recover.  Recent Labs  Lab 08/25/20 0807 08/26/20 0244 08/27/20 0448 08/28/20 0304 08/30/20 0440  WBC 18.5* 21.7* 18.8* 17.1* 19.9*   History of DVT not on  anticoagulation due to history of falls. Continue Lovenox prophylaxis  Normocytic anemia stable hb at 10 gm Recent Labs  Lab 08/25/20 0807 08/26/20 0244 08/27/20 0448 08/28/20 0304 08/30/20 0440  HGB 9.4* 10.1* 10.4* 10.0* 10.3*  HCT 30.1* 32.5* 32.9* 32.0* 32.3*   Hypophosphatemia/hypomagnesemia, electrolytes improved.    Dehydration/ketosis in the urine, cont TPN until able to take p.o. well.  Goals of care full code.Multiple comorbidities,with significant pulmonary disease,chronic respiratory failure now with SBO-debilitated.palliative care was consulted.  Nutrition Status: Severe protein calorie malnourishment present.  Has been on TPN 2/12.  Pharmacy dietitian following. Nutrition Problem: Severe Malnutrition Etiology: chronic illness (ILD/pulmonary fibrosis) Signs/Symptoms: severe fat depletion,severe muscle depletion Interventions: Boost Breeze,Prostat  Diet Order            DIET SOFT Room service appropriate? Yes; Fluid consistency: Thin  Diet effective now                DVT prophylaxis:  Code Status:   Code Status: Full Code  Family Communication:  Her nephew has been updated previously and extensively. At this time awaiting on return of bowel fun.  Status is Inpatient Remains inpatient appropriate because:IV treatments appropriate due to intensity of illness or inability to take PO and Inpatient level of care appropriate due to severity of illness.  Dispo:The patient is from: Home            Anticipated d/c is to: SNF            Anticipated d/c date is: 2 to 3 days            Patient currently is not medically stable to d/c.            Difficult to place patient No   Consultants: [facility] GI PALLIATIVE MEDICINE PCCM   Procedures: EX LAP 2/13  Culture/Microbiology Other culture-see note  Medications: Scheduled Meds: . (feeding supplement) PROSource Plus  30 mL Oral BID BM  . acetaminophen  650 mg Oral Q6H  . bisacodyl  10 mg Rectal Daily  .  chlorhexidine  15 mL Mouth Rinse BID  . Chlorhexidine Gluconate Cloth  6 each Topical Daily  . docusate sodium  100 mg Oral BID  . feeding supplement  1 Container Oral BID BM  . insulin aspart  0-9 Units Subcutaneous Q6H  . mouth rinse  15 mL Mouth Rinse q12n4p  . predniSONE  5 mg Oral Q breakfast  . sodium chloride flush  10-40 mL Intracatheter Q12H   Continuous Infusions: . sodium chloride 1 mL (08/27/20 2216)  . methocarbamol (ROBAXIN) IV 500 mg (08/30/20 0503)  . TPN ADULT (ION) 60 mL/hr at 08/29/20 1745  . TPN ADULT (ION)      Antimicrobials: Anti-infectives (From admission, onward)   Start     Dose/Rate Route Frequency Ordered Stop   08/23/20 0930  ceFAZolin (ANCEF) IVPB 2g/100 mL premix  Status:  Discontinued       "And" Linked Group Details   2 g 200 mL/hr over 30 Minutes Intravenous On call to O.R. 08/23/20 3810 08/23/20 0841   08/23/20 0930  metroNIDAZOLE (FLAGYL) IVPB 500 mg       "And" Linked Group Details   500 mg 100 mL/hr over 60 Minutes Intravenous On call to O.R. 08/23/20 1751  08/23/20 1027   08/23/20 0923  ceFAZolin (ANCEF) 2-4 GM/100ML-% IVPB  Status:  Discontinued       Note to Pharmacy: Enrigue Catena   : cabinet override      08/23/20 0923 08/23/20 0938   08/19/20 1000  dapsone tablet 100 mg  Status:  Discontinued       Note to Pharmacy: TAKE 1 TABLET(100 MG) BY MOUTH DAILY     100 mg Oral Daily 08/18/20 1350 08/22/20 0805   08/18/20 1000  cefTRIAXone (ROCEPHIN) 1 g in sodium chloride 0.9 % 100 mL IVPB  Status:  Discontinued        1 g 200 mL/hr over 30 Minutes Intravenous Every 24 hours 08/17/20 1414 08/24/20 1209   08/17/20 0615  cefTRIAXone (ROCEPHIN) 1 g in sodium chloride 0.9 % 100 mL IVPB        1 g 200 mL/hr over 30 Minutes Intravenous  Once 08/17/20 0605 08/17/20 0801     Objective: Vitals: Today's Vitals   08/30/20 0500 08/30/20 0527 08/30/20 0600 08/30/20 0700  BP:  99/60    Pulse:  96    Resp:  20 (!) 21 (!) 21  Temp:  98.6 F (37 C)     TempSrc:  Oral    SpO2:  95%    Weight: 48.8 kg     Height:      PainSc:    2     Intake/Output Summary (Last 24 hours) at 08/30/2020 1041 Last data filed at 08/30/2020 0540 Gross per 24 hour  Intake 904.77 ml  Output 2450 ml  Net -1545.23 ml   Filed Weights   08/28/20 0500 08/29/20 0416 08/30/20 0500  Weight: 54.8 kg 49.1 kg 48.8 kg   Weight change: -0.293 kg  Intake/Output from previous day: 02/19 0701 - 02/20 0700 In: 904.8 [P.O.:120; I.V.:784.8] Out: 2450 [Urine:2450] Intake/Output this shift: No intake/output data recorded. Filed Weights   08/28/20 0500 08/29/20 0416 08/30/20 0500  Weight: 54.8 kg 49.1 kg 48.8 kg   Examination:  General exam: Appears calm and comfortable  Respiratory system: Clear to auscultation. Respiratory effort normal. Cardiovascular system: S1 & S2 heard, RRR. No JVD, murmurs, rubs, gallops or clicks. No pedal edema. Gastrointestinal system: Abdomen is nondistended, soft and mild periumbilical tenderness. No organomegaly or masses felt. Normal bowel sounds heard. Central nervous system: Alert and oriented. No focal neurological deficits. Extremities: Symmetric 5 x 5 power. Skin: No rashes, lesions or ulcers.  Psychiatry: Judgement and insight appear normal. Mood & affect appropriate.     Data Reviewed: I have personally reviewed following labs and imaging studies CBC: Recent Labs  Lab 08/24/20 0408 08/25/20 0807 08/26/20 0244 08/27/20 0448 08/28/20 0304 08/30/20 0440  WBC 14.3* 18.5* 21.7* 18.8* 17.1* 19.9*  NEUTROABS 11.8*  --   --   --   --   --   HGB 10.3* 9.4* 10.1* 10.4* 10.0* 10.3*  HCT 34.5* 30.1* 32.5* 32.9* 32.0* 32.3*  MCV 104.9* 99.7 98.8 97.3 97.9 97.3  PLT 157 145* 160 195 206 277   Basic Metabolic Panel: Recent Labs  Lab 08/26/20 0244 08/27/20 0448 08/28/20 0304 08/29/20 0400 08/30/20 0440  NA 138 138 137 136 133*  K 3.7 3.4* 4.1 4.1 3.9  CL 102 102 101 100 97*  CO2 28 29 28 29 30   GLUCOSE 143* 102* 109*  99 113*  BUN 14 18 17 16 17   CREATININE 0.44 0.34* 0.43* 0.36* 0.47  CALCIUM 8.1* 8.5* 8.2* 8.5* 8.3*  MG  2.0 1.8 2.0 1.7 1.8  PHOS 2.7 2.2* 3.5 2.5 2.9   GFR: Estimated Creatinine Clearance: 38.2 mL/min (by C-G formula based on SCr of 0.47 mg/dL). Liver Function Tests: Recent Labs  Lab 08/24/20 0408 08/25/20 0231 08/26/20 0244 08/27/20 0448  AST 18 15 16 27   ALT 10 10 12 22   ALKPHOS 44 45 51 59  BILITOT 0.6 0.6 0.6 0.5  PROT 5.1* 4.9* 5.0* 4.8*  ALBUMIN 2.4* 2.2* 2.2* 2.0*   No results for input(s): LIPASE, AMYLASE in the last 168 hours. No results for input(s): AMMONIA in the last 168 hours. Coagulation Profile: No results for input(s): INR, PROTIME in the last 168 hours. Cardiac Enzymes: No results for input(s): CKTOTAL, CKMB, CKMBINDEX, TROPONINI in the last 168 hours. BNP (last 3 results) Recent Labs    06/03/20 1001  PROBNP 100   HbA1C: No results for input(s): HGBA1C in the last 72 hours. CBG: Recent Labs  Lab 08/29/20 0639 08/29/20 1145 08/29/20 1731 08/29/20 2347 08/30/20 0529  GLUCAP 128* 140* 128* 117* 128*   Lipid Profile: No results for input(s): CHOL, HDL, LDLCALC, TRIG, CHOLHDL, LDLDIRECT in the last 72 hours. Thyroid Function Tests: No results for input(s): TSH, T4TOTAL, FREET4, T3FREE, THYROIDAB in the last 72 hours. Anemia Panel: No results for input(s): VITAMINB12, FOLATE, FERRITIN, TIBC, IRON, RETICCTPCT in the last 72 hours. Sepsis Labs: Recent Labs  Lab 08/26/20 0450 08/27/20 0448 08/28/20 0304  PROCALCITON 0.19 <0.10 <0.10    Recent Results (from the past 240 hour(s))  Culture, Urine     Status: Abnormal   Collection Time: 08/21/20  5:40 PM   Specimen: Urine, Clean Catch  Result Value Ref Range Status   Specimen Description   Final    URINE, CLEAN CATCH Performed at [facility], [place] [address] [place], [place] [address]    Special Requests   Final    NONE Performed at [facility],  [place] [address] [place], [place] [address]    Culture (A)  Final    >=100,000 COLONIES/mL PSEUDOMONAS AERUGINOSA >=100,000 COLONIES/mL ENTEROCOCCUS FAECALIS    Report Status 08/25/2020 FINAL  Final   Organism ID, Bacteria PSEUDOMONAS AERUGINOSA (A)  Final   Organism ID, Bacteria ENTEROCOCCUS FAECALIS (A)  Final      Susceptibility   Enterococcus faecalis - MIC*    AMPICILLIN <=2 SENSITIVE Sensitive     NITROFURANTOIN <=16 SENSITIVE Sensitive     VANCOMYCIN 1 SENSITIVE Sensitive     * >=100,000 COLONIES/mL ENTEROCOCCUS FAECALIS   Pseudomonas aeruginosa - MIC*    CEFTAZIDIME 4 SENSITIVE Sensitive     CIPROFLOXACIN <=0.25 SENSITIVE Sensitive     GENTAMICIN 2 SENSITIVE Sensitive     IMIPENEM 1 SENSITIVE Sensitive     PIP/TAZO <=4 SENSITIVE Sensitive     * >=100,000 COLONIES/mL PSEUDOMONAS AERUGINOSA  MRSA PCR Screening     Status: Abnormal   Collection Time: 08/23/20  1:27 PM   Specimen: Nasal Mucosa; Nasopharyngeal  Result Value Ref Range Status   MRSA by PCR POSITIVE (A) NEGATIVE Final    Comment:        The GeneXpert MRSA Assay (FDA approved for NASAL specimens only), is one component of a comprehensive MRSA colonization surveillance program. It is not intended to diagnose MRSA infection nor to guide or monitor treatment for MRSA infections. RESULT CALLED TO, READ BACK BY AND VERIFIED WITH: CARTER,K RN @1459  ON 08/23/20 JACKSON,K Performed at [facility], [place] [address] [place], [place]  [place]      Radiology Studies: No results found.   LOS: 13 days   Darliss Cheney, MD [facility]  08/30/2020, 10:41 AM

## 2020-08-30 NOTE — Progress Notes (Signed)
PHARMACY - TOTAL PARENTERAL NUTRITION CONSULT NOTE   Indication: Small bowel obstruction  Patient Measurements: Height: 5' 4.5" (163.8 cm) Weight: 48.8 kg (107 lb 9.6 oz) IBW/kg (Calculated) : 55.85 TPN AdjBW (KG): 44.5 Body mass index is 18.18 kg/m.  Assessment: 87 yo female with persistent SBO that may be result of gallstone ileus. Multiple attempts at NGT have failed; per CCS, at this time no further options besides doing surgery although high risk. She is now s/p exploratory laparotomy, detorsion of small bowel, placement of Stamm gastrostomy tube on 08/23/20.  Glucose / Insulin: sSSI q6h (used 4 units in 24 hrs) -  CBG (goal <150): wnl - note patient on chronic steroids currently on solucortef 100mg  q12 >> prednisone Electrolytes: Na low 133, K 3.9, Phos 2.9, Mag 1.8, CorrCa 9.90; CL slight low at 97 and CO2 wnl Renal: SCr low LFTs / TGs: LFTs, TG WNL Prealbumin / albumin: both low and decreased from prior Intake / Output:  I/O -1545, good UOP - NGT clamped MIVF:  [facility] at [facility] GI Imaging: 2/11 SBO per CT Surgeries / Procedures: -  2/13 ex lap, detorsion of small bowel, placement of G- tube  Central access: 2/12 - PICC TPN start date: 2/12  Nutritional Goals (per RD recommendation on 2/11): Severe malnutrition kCal: 1500-1700, Protein: 70-85, Fluid: >= 1.4L  Note that currently there is a Lipid emulsion shortage so SMOFlipid will only be added on MWF for appropriate patients  Goal TPN rate MWF with lipid (30gm/L)  is 60 mL/hr (provides 72 g of protein and 1699 kcals per day)  Goal TPN rate TuThSatSun without lipid is 60 ml/hr (provides 72g protein and 1267 kcal)   Current Nutrition:  - Advancing >> CLD >> soft diet - TPN at 60 ml/hr   - 2/18: boost bid, prosource bid added  Plan:  - Per CCS progress note on 2/20, can wean TPN starting on 2/21.  Now:  - magnesium sulfate 2gm IV x1  At 1800:  - Continue TPN at goal rate 60 ml/hr - Electrolytes in TPN:    Increase to 150 mEq/L of Na  50 mEq/L of K   74mEq/L of Ca   Increase to 7.5 mEq/L of Mg  Continue 20 mmol/L of Phos  Cl:Ac ratio 2:1 - Add standard MVI and trace elements to TPN - continue SSI with q6 hr CBG checks - Pepcid 20mg  per day in TPN - Monitor TPN labs on Mon/Thurs and as needed   Kiera Hussey P  08/30/2020,8:04 AM

## 2020-08-30 NOTE — Progress Notes (Signed)
7 Days Post-Op   Subjective/Chief Complaint: Awake and alert today.  Very conversive.  Stated she was quite tired yesterday.  Denies abdominal pain, nausea or vomiting.   Objective: Vital signs in last 24 hours: Temp:  [98.6 F (37 C)-98.8 F (37.1 C)] 98.6 F (37 C) (02/20 0527) Pulse Rate:  [91-96] 96 (02/20 0527) Resp:  [20-21] 21 (02/20 0700) BP: (99-104)/(60-61) 99/60 (02/20 0527) SpO2:  [95 %-97 %] 95 % (02/20 0527) Weight:  [48.8 kg] 48.8 kg (02/20 0500) Last BM Date: 08/29/20  Intake/Output from previous day: 02/19 0701 - 02/20 0700 In: 904.8 [P.O.:120; I.V.:784.8] Out: 2450 [Urine:2450] Intake/Output this shift: No intake/output data recorded.   Gen: Alert, sleepy but arousable  Pulm:WOB normal  Abd: Soft,minimal distension, +BS, midline incision cdi with staples present and no erythema or drainage, G tube site with scant drainage clear  and tube isclamped Lab Results:  Recent Labs    08/28/20 0304 08/30/20 0440  WBC 17.1* 19.9*  HGB 10.0* 10.3*  HCT 32.0* 32.3*  PLT 206 228   BMET Recent Labs    08/29/20 0400 08/30/20 0440  NA 136 133*  K 4.1 3.9  CL 100 97*  CO2 29 30  GLUCOSE 99 113*  BUN 16 17  CREATININE 0.36* 0.47  CALCIUM 8.5* 8.3*   PT/INR No results for input(s): LABPROT, INR in the last 72 hours. ABG No results for input(s): PHART, HCO3 in the last 72 hours.  Invalid input(s): PCO2, PO2  Studies/Results: No results found.  Anti-infectives: Anti-infectives (From admission, onward)   Start     Dose/Rate Route Frequency Ordered Stop   08/23/20 0930  ceFAZolin (ANCEF) IVPB 2g/100 mL premix  Status:  Discontinued       "And" Linked Group Details   2 g 200 mL/hr over 30 Minutes Intravenous On call to O.R. 08/23/20 0086 08/23/20 0841   08/23/20 0930  metroNIDAZOLE (FLAGYL) IVPB 500 mg       "And" Linked Group Details   500 mg 100 mL/hr over 60 Minutes Intravenous On call to O.R. 08/23/20 0841 08/23/20 1027   08/23/20 0923   ceFAZolin (ANCEF) 2-4 GM/100ML-% IVPB  Status:  Discontinued       Note to Pharmacy: Enrigue Catena   : cabinet override      08/23/20 0923 08/23/20 0938   08/19/20 1000  dapsone tablet 100 mg  Status:  Discontinued       Note to Pharmacy: TAKE 1 TABLET(100 MG) BY MOUTH DAILY     100 mg Oral Daily 08/18/20 1350 08/22/20 0805   08/18/20 1000  cefTRIAXone (ROCEPHIN) 1 g in sodium chloride 0.9 % 100 mL IVPB  Status:  Discontinued        1 g 200 mL/hr over 30 Minutes Intravenous Every 24 hours 08/17/20 1414 08/24/20 1209   08/17/20 0615  cefTRIAXone (ROCEPHIN) 1 g in sodium chloride 0.9 % 100 mL IVPB        1 g 200 mL/hr over 30 Minutes Intravenous  Once 08/17/20 0605 08/17/20 0801      Assessment/Plan: s/p Procedure(s): EXPLORATORY LAPAROTOMY small bowel resection gastrostomy tube placement (N/A)   Pulmonary fibrosis/interstitial lung disease Rheumatoid arthritis - cellcept/Remicade/predinsone - hydrocortisone 100mg dailyIV currently Hx CAD/CHF Hx of DVT  Anemia Severeprotein-caloriemalnutrition- prealbumin 6.0 Leukocytosis-hovering between 17 and 19,000.  May need CT scan tomorrow if this does not begin to resolve.  This could be secondary as well to her medications for her rheumatoid arthritis since clinically she looks quite well  today. 14.6>>16.9>>14.3>>14.3>>21.7>>18.8>>17.1 UTI > 100K Pseudomonas  SBO/Torsion of small bowel mesentary S/pExploratory laparotomy, detorsion of small bowel, placement of Stamm Gastrostomy tube, 08/23/20, Michaelle Birks - POD#7 - Advance to soft  tray. KeepG tubeclamped.If patient becomes distended or complains of nausea then return G tube to gravity drainage.  - Continue TPN until reliably tolerating diet- can wean starting 2/21  - Continue PT/OT, mobilize.  FEN: TPN;clampG-tube,CLD ID: rocephin 2/7- 2/12 DVT: Lovenox Follow up: Dr. Zenia Resides Foley:out  LOS: 13 days    [name] [name] 08/30/2020

## 2020-08-30 NOTE — Plan of Care (Signed)

## 2020-08-31 DIAGNOSIS — K56609 Unspecified intestinal obstruction, unspecified as to partial versus complete obstruction: Secondary | ICD-10-CM | POA: Diagnosis not present

## 2020-08-31 LAB — CBC
HCT: 32.1 % — ABNORMAL LOW (ref 36.0–46.0)
Hemoglobin: 10.2 g/dL — ABNORMAL LOW (ref 12.0–15.0)
MCH: 30.7 pg (ref 26.0–34.0)
MCHC: 31.8 g/dL (ref 30.0–36.0)
MCV: 96.7 fL (ref 80.0–100.0)
Platelets: 257 10*3/uL (ref 150–400)
RBC: 3.32 MIL/uL — ABNORMAL LOW (ref 3.87–5.11)
RDW: 15.7 % — ABNORMAL HIGH (ref 11.5–15.5)
WBC: 17.3 10*3/uL — ABNORMAL HIGH (ref 4.0–10.5)
nRBC: 0 % (ref 0.0–0.2)

## 2020-08-31 LAB — COMPREHENSIVE METABOLIC PANEL
ALT: 102 U/L — ABNORMAL HIGH (ref 0–44)
AST: 89 U/L — ABNORMAL HIGH (ref 15–41)
Albumin: 2.2 g/dL — ABNORMAL LOW (ref 3.5–5.0)
Alkaline Phosphatase: 75 U/L (ref 38–126)
Anion gap: 7 (ref 5–15)
BUN: 18 mg/dL (ref 8–23)
CO2: 29 mmol/L (ref 22–32)
Calcium: 8.4 mg/dL — ABNORMAL LOW (ref 8.9–10.3)
Chloride: 99 mmol/L (ref 98–111)
Creatinine, Ser: 0.42 mg/dL — ABNORMAL LOW (ref 0.44–1.00)
GFR, Estimated: >60 mL/min (ref >60)
Glucose, Bld: 116 mg/dL — ABNORMAL HIGH (ref 70–99)
Potassium: 3.9 mmol/L (ref 3.5–5.1)
Sodium: 135 mmol/L (ref 135–145)
Total Bilirubin: 0.6 mg/dL (ref 0.3–1.2)
Total Protein: 5.6 g/dL — ABNORMAL LOW (ref 6.5–8.1)

## 2020-08-31 LAB — DIFFERENTIAL
Abs Immature Granulocytes: 1.69 10*3/uL — ABNORMAL HIGH (ref 0.00–0.07)
Basophils Absolute: 0.1 10*3/uL (ref 0.0–0.1)
Basophils Relative: 0 %
Eosinophils Absolute: 0.2 10*3/uL (ref 0.0–0.5)
Eosinophils Relative: 1 %
Immature Granulocytes: 10 %
Lymphocytes Relative: 15 %
Lymphs Abs: 2.6 10*3/uL (ref 0.7–4.0)
Monocytes Absolute: 1.5 10*3/uL — ABNORMAL HIGH (ref 0.1–1.0)
Monocytes Relative: 8 %
Neutro Abs: 11.3 10*3/uL — ABNORMAL HIGH (ref 1.7–7.7)
Neutrophils Relative %: 66 %

## 2020-08-31 LAB — GLUCOSE, CAPILLARY
Glucose-Capillary: 107 mg/dL — ABNORMAL HIGH (ref 70–99)
Glucose-Capillary: 112 mg/dL — ABNORMAL HIGH (ref 70–99)
Glucose-Capillary: 127 mg/dL — ABNORMAL HIGH (ref 70–99)
Glucose-Capillary: 133 mg/dL — ABNORMAL HIGH (ref 70–99)
Glucose-Capillary: 153 mg/dL — ABNORMAL HIGH (ref 70–99)

## 2020-08-31 LAB — TRIGLYCERIDES: Triglycerides: 89 mg/dL (ref <150)

## 2020-08-31 LAB — PREALBUMIN: Prealbumin: 21.4 mg/dL (ref 18–38)

## 2020-08-31 LAB — MAGNESIUM: Magnesium: 1.6 mg/dL — ABNORMAL LOW (ref 1.7–2.4)

## 2020-08-31 LAB — PHOSPHORUS: Phosphorus: 2.8 mg/dL (ref 2.5–4.6)

## 2020-08-31 MED ORDER — ENOXAPARIN SODIUM 40 MG/0.4ML ~~LOC~~ SOLN
40.0000 mg | SUBCUTANEOUS | Status: DC
  Administered 2020-08-31: 40 mg via SUBCUTANEOUS
  Filled 2020-08-31: qty 0.4

## 2020-08-31 MED ORDER — TRAVASOL 10 % IV SOLN
INTRAVENOUS | Status: AC
  Filled 2020-08-31: qty 360

## 2020-08-31 MED ORDER — MAGNESIUM SULFATE 2 GM/50ML IV SOLN
2.0000 g | Freq: Once | INTRAVENOUS | Status: AC
  Administered 2020-08-31: 2 g via INTRAVENOUS
  Filled 2020-08-31: qty 50

## 2020-08-31 MED ORDER — METHOCARBAMOL 500 MG PO TABS
500.0000 mg | ORAL_TABLET | Freq: Three times a day (TID) | ORAL | Status: DC
Start: 2020-08-31 — End: 2020-09-08
  Administered 2020-08-31 – 2020-09-08 (×22): 500 mg via ORAL
  Filled 2020-08-31 (×22): qty 1

## 2020-08-31 MED ORDER — MORPHINE SULFATE (PF) 2 MG/ML IV SOLN
2.0000 mg | INTRAVENOUS | Status: DC | PRN
  Administered 2020-09-01 – 2020-09-02 (×3): 2 mg via INTRAVENOUS
  Filled 2020-08-31 (×3): qty 1

## 2020-08-31 MED ORDER — OXYCODONE HCL 5 MG PO TABS
2.5000 mg | ORAL_TABLET | ORAL | Status: DC | PRN
  Administered 2020-08-31 – 2020-09-08 (×8): 5 mg via ORAL
  Filled 2020-08-31 (×8): qty 1

## 2020-08-31 NOTE — Progress Notes (Signed)
ANTICOAGULATION CONSULT NOTE - Initial Consult  Pharmacy Consult for enoxaparin Indication: VTE prophylaxis  Allergies  Allergen Reactions  . Crestor [Rosuvastatin] Other (See Comments)    Muscle pain  . Lactose Intolerance (Gi) Other (See Comments)    Stomach upset  . Penicillins Hives  . Imuran [Azathioprine] Nausea And Vomiting    Patient Measurements: Height: 5' 4.5" (163.8 cm) Weight: 49.2 kg (108 lb 6.4 oz) IBW/kg (Calculated) : 55.85  Vital Signs: Temp: 98.9 F (37.2 C) (02/21 0556) Temp Source: Oral (02/21 0556) BP: 103/57 (02/21 0556) Pulse Rate: 97 (02/21 0556)  Labs: Recent Labs    08/29/20 0400 08/30/20 0440 08/31/20 0439  HGB  --  10.3* 10.2*  HCT  --  32.3* 32.1*  PLT  --  228 257  CREATININE 0.36* 0.47 0.42*    Estimated Creatinine Clearance: 38.5 mL/min (A) (by C-G formula based on SCr of 0.42 mg/dL (L)).   Medical History: Past Medical History:  Diagnosis Date  . Allergic rhinitis   . Allergy   . CAD (coronary artery disease)    Mild CAD by cath 2008  . CHF (congestive heart failure) ([place])   . DJD (degenerative joint disease)    rheumatoid  . DVT (deep venous thrombosis) ([place])   . Dyslipidemia   . History of echocardiogram    Echo 12/2018: EF 60-65, mod asymmetric LVH, Gr 1 DD  . History of nuclear stress test    Myoview 5/17:  EF 74%, normal perfusion, low risk // Myoview 12/2018:  EF 89, very mild ischemia in inferoapical wall; Low Risk  . History of pulmonary embolism   . Hyperlipidemia   . Insomnia   . Osteoporosis   . Psoriasis   . Pulmonary fibrosis ([place])   . Rheumatoid arthritis ([facility])     Medications: Pt not on anticoagulants PTA  Assessment: Pt is a 15 yoF admitted with SBO requiring TNA. Pt has PMH significant for DVT, not on anticoagulation due to fall history/risk. Pharmacy consulted to dose enoxaparin for DVT ppx inpatient.   Today, 08/31/20  CBC: Hgb (10.2) slightly low but stable; Plt WNL  TBW = 49 kg  SCr  0.42, CrCl ~38 mL/min  Plan:   Enoxaparin 40 mg subQ q24h  Renal function stable, pharmacy to sign off. Please re-consult if needed.   Lenis Noon, PharmD 08/31/2020,12:09 PM

## 2020-08-31 NOTE — Plan of Care (Signed)

## 2020-08-31 NOTE — Progress Notes (Signed)
Physical Therapy Treatment Patient Details Name: [name] [name] MRN: [id number] DOB: 29-Oct-1930 Today's Date: 08/31/2020    History of Present Illness 87 yo female admitted with SBO, UTI,   S/P Exploratory laparotomy, detorsion of small bowel, placement of Stamm Gastrostomy tube, 08/23/20. Hx of falls, pulm fibrosis, chronic respiratory failure-O2 dep 2L, DVT, osteoporosis, RA, DJD, CAD, CHF    PT Comments    Pt AxO x 3 very pleasant.  General bed mobility comments: required assist with supine to sit due to recent ABD surgery.  General transfer comment: 25% VC's on proper hand placement and safety with turns.  General Gait Details: steady assist , slow gait. Cues for position inside RW.  Avg RA was 93% and HR 126  Positioned in recliner to comfort.    Follow Up Recommendations  SNF     Equipment Recommendations  None recommended by PT    Recommendations for Other Services       Precautions / Restrictions Precautions Precautions: Fall Precaution Comments: O2 dep,  gastrostomy tube drain, Required Braces or Orthoses: Other Brace Other Brace: ABD binder applied but way to big    Mobility  Bed Mobility Overal bed mobility: Needs Assistance Bed Mobility: Supine to Sit     Supine to sit: Min assist     General bed mobility comments: required assist with supine to sit due to recent ABD surgery    Transfers Overall transfer level: Needs assistance Equipment used: Rolling walker (2 wheeled) Transfers: Sit to/from Stand Sit to Stand: Min assist         General transfer comment: 25% VC's on proper hand placement and safety with turns  Ambulation/Gait Ambulation/Gait assistance: Min assist Gait Distance (Feet): 22 Feet (11 feet x 2 to and from bathroom) Assistive device: Rolling walker (2 wheeled) Gait Pattern/deviations: Decreased stride length Gait velocity: decreased   General Gait Details: steady assist , slow gait. Cues for position inside RW.  Avg RA was 93% and HR  126   Stairs             Wheelchair Mobility    Modified Rankin (Stroke Patients Only)       Balance                                            Cognition Arousal/Alertness: Awake/alert Behavior During Therapy: WFL for tasks assessed/performed                                   General Comments: AxO x 3 very pleasant      Exercises      General Comments        Pertinent Vitals/Pain Pain Assessment: No/denies pain    Home Living                      Prior Function            PT Goals (current goals can now be found in the care plan section) Progress towards PT goals: Progressing toward goals    Frequency    Min 2X/week      PT Plan Current plan remains appropriate;Frequency needs to be updated;Discharge plan needs to be updated    Co-evaluation              AM-PAC PT "  6 Clicks" Mobility   Outcome Measure  Help needed turning from your back to your side while in a flat bed without using bedrails?: A Little Help needed moving from lying on your back to sitting on the side of a flat bed without using bedrails?: A Little Help needed moving to and from a bed to a chair (including a wheelchair)?: A Little Help needed standing up from a chair using your arms (e.g., wheelchair or bedside chair)?: A Little Help needed to walk in hospital room?: A Little Help needed climbing 3-5 steps with a railing? : A Lot 6 Click Score: 17    End of Session Equipment Utilized During Treatment: Gait belt Activity Tolerance: Patient tolerated treatment well Patient left: in chair;with call bell/phone within reach;with chair alarm set Nurse Communication: Mobility status PT Visit Diagnosis: Muscle weakness (generalized) (M62.81);Unsteadiness on feet (R26.81)     Time: 1028-1100 PT Time Calculation (min) (ACUTE ONLY): 32 min  Charges:  $Gait Training: 8-22 mins $Therapeutic Activity: 8-22 mins                      Rica Koyanagi  PTA Acute  Rehabilitation Services Pager      [phone] Office      [phone]

## 2020-08-31 NOTE — Progress Notes (Signed)
[facility] Progress Note  8 Days Post-Op  Subjective: CC-  Abdomen sore but pain fairly well controlled. States that she does have some discomfort when she moves around. Denies bloating, n/v. BM yesterday. Tolerating soft diet but not eating much. She drank Boost x2 yesterday.  Objective: Vital signs in last 24 hours: Temp:  [98.7 F (37.1 C)-99.1 F (37.3 C)] 98.9 F (37.2 C) (02/21 0556) Pulse Rate:  [92-97] 97 (02/21 0556) Resp:  [20-24] 20 (02/21 0556) BP: (103-112)/(57-60) 103/57 (02/21 0556) SpO2:  [95 %-97 %] 95 % (02/21 0556) Weight:  [49.2 kg] 49.2 kg (02/21 0500) Last BM Date: 08/30/20  Intake/Output from previous day: 02/20 0701 - 02/21 0700 In: 1833.3 [P.O.:240; I.V.:1493.3; IV Piggyback:100] Out: 700 [Urine:700] Intake/Output this shift: No intake/output data recorded.  PE: Gen: Alert,NAD Pulm:rate and effort normal Abd: Soft,minimal distension, +BS, midline incision cdi with staples present - trace erythema at site of staple insertion otherwise no diffuse cellulitis and no drainage, G tube sitewith scant drainage and tube isclamped  Lab Results:  Recent Labs    08/30/20 0440 08/31/20 0439  WBC 19.9* 17.3*  HGB 10.3* 10.2*  HCT 32.3* 32.1*  PLT 228 257   BMET Recent Labs    08/30/20 0440 08/31/20 0439  NA 133* 135  K 3.9 3.9  CL 97* 99  CO2 30 29  GLUCOSE 113* 116*  BUN 17 18  CREATININE 0.47 0.42*  CALCIUM 8.3* 8.4*   PT/INR No results for input(s): LABPROT, INR in the last 72 hours. CMP     Component Value Date/Time   NA 135 08/31/2020 0439   NA 138 04/29/2020 0948   K 3.9 08/31/2020 0439   CL 99 08/31/2020 0439   CO2 29 08/31/2020 0439   GLUCOSE 116 (H) 08/31/2020 0439   BUN 18 08/31/2020 0439   BUN 11 04/29/2020 0948   CREATININE 0.42 (L) 08/31/2020 0439   CREATININE 0.74 11/30/2015 1327   CALCIUM 8.4 (L) 08/31/2020 0439   PROT 5.6 (L) 08/31/2020 0439   PROT 6.5 04/29/2020 0948   ALBUMIN 2.2 (L) 08/31/2020  0439   ALBUMIN 3.8 04/29/2020 0948   AST 89 (H) 08/31/2020 0439   ALT 102 (H) 08/31/2020 0439   ALKPHOS 75 08/31/2020 0439   BILITOT 0.6 08/31/2020 0439   BILITOT 0.7 04/29/2020 0948   GFRNONAA >60 08/31/2020 0439   GFRAA 69 04/29/2020 0948   Lipase     Component Value Date/Time   LIPASE 27 08/17/2020 0410       Studies/Results: No results found.  Anti-infectives: Anti-infectives (From admission, onward)   Start     Dose/Rate Route Frequency Ordered Stop   08/23/20 0930  ceFAZolin (ANCEF) IVPB 2g/100 mL premix  Status:  Discontinued       "And" Linked Group Details   2 g 200 mL/hr over 30 Minutes Intravenous On call to O.R. 08/23/20 6629 08/23/20 0841   08/23/20 0930  metroNIDAZOLE (FLAGYL) IVPB 500 mg       "And" Linked Group Details   500 mg 100 mL/hr over 60 Minutes Intravenous On call to O.R. 08/23/20 0841 08/23/20 1027   08/23/20 0923  ceFAZolin (ANCEF) 2-4 GM/100ML-% IVPB  Status:  Discontinued       Note to Pharmacy: Enrigue Catena   : cabinet override      08/23/20 0923 08/23/20 0938   08/19/20 1000  dapsone tablet 100 mg  Status:  Discontinued       Note to Pharmacy: TAKE 1  TABLET(100 MG) BY MOUTH DAILY     100 mg Oral Daily 08/18/20 1350 08/22/20 0805   08/18/20 1000  cefTRIAXone (ROCEPHIN) 1 g in sodium chloride 0.9 % 100 mL IVPB  Status:  Discontinued        1 g 200 mL/hr over 30 Minutes Intravenous Every 24 hours 08/17/20 1414 08/24/20 1209   08/17/20 0615  cefTRIAXone (ROCEPHIN) 1 g in sodium chloride 0.9 % 100 mL IVPB        1 g 200 mL/hr over 30 Minutes Intravenous  Once 08/17/20 1103 08/17/20 0801       Assessment/Plan Pulmonary fibrosis/interstitial lung disease Rheumatoid arthritis - cellcept/Remicade/predinsone - prednisone 5mg  qd currently Hx CAD/CHF Hx of DVT  Anemia Severeprotein-caloriemalnutrition- prealbumin 6.0 (2/14), up to 21.4 (2/21) UTI > 100K Pseudomonas  SBO/Torsion of small bowel mesentary S/pExploratory laparotomy,  detorsion of small bowel, placement of Stamm Gastrostomy tube, 08/23/20, Michaelle Birks - POD#8 - Tolerating some soft diet and having bowel function. G tube is clamped. Decrease TPN to 1/2 rate and encourage PO intake. Dietician following  - WBC remains elevated but is not trending up, she is afebrile, and clinically improving - Continue PT/OT, mobilize.  FEN: 1/2TPN;clampG-tube, soft diet, Boost ID: rocephin 2/7- 2/12 DVT: ok for chemical DVT prophylaxis from surgical standpoint Follow up: staple removal ~POD#14, Dr. Zenia Resides Foley:out   LOS: 14 days    Wellington Hampshire, [facility] Surgery 08/31/2020, 8:41 AM Please see Amion for pager number during day hours 7:00am-4:30pm

## 2020-08-31 NOTE — Progress Notes (Signed)
PROGRESS NOTE    [name] HADSALL  ZOX:[id number] DOB: Feb 07, 1931 DOA: 08/17/2020 PCP: Hoyt Koch, MD   Chief Complaint  Patient presents with  . Abdominal Pain  Brief Narrative: 87 year old female with history of chronic hypoxic respiratory failure on 2 L nasal cannula secondary to ILD/pulmonary fibrosis,rheumatoid arthritis on chronic prednisone, CAD/CHF, dyslipidemia, history of DVT, presented with nausea vomiting abdominal pain, onset 3 days prior to admission.Patient seen in the ED 2/7 reported having a bowel movement a week prior to admission in the ED found to have SBO, fecal impaction disimpacted x2 with enema, CCS was consulted for SBO and was admitted for further management. 2/8- started on CLD, but had episode of vomiting on 2/9 - had Xray abd that showed normal bowel gas pattern. She is followed closely by general surgery.  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 2/12: Multiple attempts for NG tube insertion has been failed-by nursing/surgeon as well as IR. 2/13: Patient with ongoing more abdominal pain significant tenderness and some confusion- s/p ex lap with detorsion of small intestine and transferred to ICU for postop ICU care Has been transferred out of ICU.  Bowel function improving 2/18- cld diet, G tube clamped  Subjective: Patient seen and examined.  No new complaint.  Abdominal pain improving.  Had a small bowel movement in last 24 hours.  Tolerating soft  diet.  Assessment & Plan:  SBO/torsion of the small bowel mesentery: s/p exlap,detorsion of small bowel and placement of gastrostomy tube 2/13 ( Dr Zenia Resides).  G-tube remains clamped since 08/28/2020, tolerating soft diet.  Passing flatus.  TPN to be cut in half today.  CCS managing.  Hypotension postop in the setting of third spacing/Anaesthesia/dehydration.  Resolved.  Stress dose hydrocortisone transition to home dose prednisone  Acute on chronic hypoxic respiratory failure on 2 L [place]: Respiratory status is stable.  This morning she was on room air and doing well.  ILD/pulmonary fibrosis/Rheumatoid arthritis on chronic prednisone, Ofev, CellCept/Remicade outpatient infusion: Resumed prednisone 10 mg 2/18 then from 2/19 home dose 5 mg. I had communicated to her Pulmonologist Dr Vaughan Browner and okay to hold her ILD meds for now.  Hx of CAD/CHF ( D) /Dyslipidemia: Remains euvolemic, no chest pain. Normally she takes Lasix 20 mg as needed for fluid or edema. She has not been taking aspirin and metoprolol at home prior to admission. Watch for fluid overload  UTI on admission UA grossly abnormal UA, repeat UA 2/12-grossly abnormal ( wbc>50,rbc>50, cloudy, LE trace) and urine culture >100,000 Pseudomonas aeruginosa and Enterococcus faecalis.  Seen by ID Dr Juleen China- discontinued antibiotics.  Denies any urinary symptoms.   Leukocytosis persistent, patient received ceftriaxone and completed.  Antibiotics were discontinued as per ID.  Leukocytosis improved somewhat yesterday.  She remains afebrile.  Recent Labs  Lab 08/26/20 0244 08/27/20 0448 08/28/20 0304 08/30/20 0440 08/31/20 0439  WBC 21.7* 18.8* 17.1* 19.9* 17.3*   History of DVT not on anticoagulation due to history of falls. Continue Lovenox prophylaxis  Normocytic anemia stable hb at  10 gm Recent Labs  Lab 08/26/20 0244 08/27/20 0448 08/28/20 0304 08/30/20 0440 08/31/20 0439  HGB 10.1* 10.4* 10.0* 10.3* 10.2*  HCT 32.5* 32.9* 32.0*  32.3* 32.1*   Hypophosphatemia/hypomagnesemia, electrolytes improved.    Dehydration/ketosis in the urine, cont TPN until able to take p.o. well.  Goals of care full code.Multiple comorbidities,with significant pulmonary disease,chronic respiratory failure now with SBO-debilitated.palliative care was consulted.  Nutrition Status: Severe protein calorie malnourishment present.  Has been on TPN 2/12.  Pharmacy dietitian following. Nutrition Problem: Severe Malnutrition Etiology: chronic illness (ILD/pulmonary fibrosis) Signs/Symptoms: severe fat depletion,severe muscle depletion Interventions: Boost Breeze,Prostat  Diet Order            DIET SOFT Room service appropriate? Yes; Fluid consistency: Thin  Diet effective now                DVT prophylaxis: We will start on Lovenox now that she has been cleared by general surgery for chemical prophylaxis. Code Status:   Code Status: Full Code  Family Communication:  Her nephew has been updated previously and extensively. At this time awaiting on return of bowel fun.  Status is Inpatient Remains inpatient appropriate because:IV treatments appropriate due to intensity of illness or inability to take PO and Inpatient level of care appropriate due to severity of illness.  Dispo:The patient is from: Home            Anticipated d/c is to: SNF, PT OT reconsulted            Anticipated d/c date is: 2 to 3 days            Patient currently is not medically stable to d/c.            Difficult to place patient No   Consultants: [facility] GI PALLIATIVE MEDICINE PCCM   Procedures: EX LAP 2/13  Culture/Microbiology Other culture-see note  Medications: Scheduled Meds: . (feeding supplement) PROSource Plus  30 mL Oral BID BM  . acetaminophen  650 mg Oral Q6H  . bisacodyl  10 mg Rectal Daily  . chlorhexidine  15 mL Mouth Rinse BID  . Chlorhexidine Gluconate Cloth  6 each Topical Daily  . docusate sodium  100 mg Oral BID  . feeding  supplement  1 Container Oral BID BM  . insulin aspart  0-9 Units Subcutaneous Q6H  . mouth rinse  15 mL Mouth Rinse q12n4p  . methocarbamol  500 mg Oral Q8H  . predniSONE  5 mg Oral Q breakfast  . sodium chloride flush  10-40 mL Intracatheter Q12H   Continuous Infusions: . sodium chloride 1 mL (08/27/20 2216)  . TPN ADULT (ION) 60 mL/hr at 08/30/20 1749  . TPN ADULT (ION)      Antimicrobials: Anti-infectives (From admission, onward)   Start     Dose/Rate Route Frequency Ordered Stop   08/23/20 0930  ceFAZolin (ANCEF) IVPB 2g/100 mL premix  Status:  Discontinued       "And" Linked Group Details   2 g 200 mL/hr over 30 Minutes Intravenous On call to O.R. 08/23/20 [id number] 08/23/20 0841   08/23/20 0930  metroNIDAZOLE (FLAGYL) IVPB 500 mg       "And" Linked Group Details   500 mg 100 mL/hr over 60 Minutes Intravenous On call to O.R. 08/23/20 0841 08/23/20 1027   08/23/20 0923  ceFAZolin (ANCEF) 2-4 GM/100ML-% IVPB  Status:  Discontinued       Note to Pharmacy: Enrigue Catena   : cabinet override  08/23/20 0923 08/23/20 0938   08/19/20 1000  dapsone tablet 100 mg  Status:  Discontinued       Note to Pharmacy: TAKE 1 TABLET(100 MG) BY MOUTH DAILY     100 mg Oral Daily 08/18/20 1350 08/22/20 0805   08/18/20 1000  cefTRIAXone (ROCEPHIN) 1 g in sodium chloride 0.9 % 100 mL IVPB  Status:  Discontinued        1 g 200 mL/hr over 30 Minutes Intravenous Every 24 hours 08/17/20 1414 08/24/20 1209   08/17/20 0615  cefTRIAXone (ROCEPHIN) 1 g in sodium chloride 0.9 % 100 mL IVPB        1 g 200 mL/hr over 30 Minutes Intravenous  Once 08/17/20 0605 08/17/20 0801     Objective: Vitals: Today's Vitals   08/30/20 1304 08/30/20 2136 08/31/20 0500 08/31/20 0556  BP: 110/60 (!) 112/57  (!) 103/57  Pulse: 96 92  97  Resp: (!) 24 20  20   Temp: 98.7 F (37.1 C) 99.1 F (37.3 C)  98.9 F (37.2 C)  TempSrc: Axillary Oral  Oral  SpO2: 95% 97%  95%  Weight:   49.2 kg   Height:      PainSc:  0-No  pain      Intake/Output Summary (Last 24 hours) at 08/31/2020 1105 Last data filed at 08/31/2020 0600 Gross per 24 hour  Intake 1833.27 ml  Output 700 ml  Net 1133.27 ml   Filed Weights   08/29/20 0416 08/30/20 0500 08/31/20 0500  Weight: 49.1 kg 48.8 kg 49.2 kg   Weight change: 0.363 kg  Intake/Output from previous day: 02/20 0701 - 02/21 0700 In: 1833.3 [P.O.:240; I.V.:1493.3; IV Piggyback:100] Out: 700 [Urine:700] Intake/Output this shift: No intake/output data recorded. Filed Weights   08/29/20 0416 08/30/20 0500 08/31/20 0500  Weight: 49.1 kg 48.8 kg 49.2 kg   Examination:  General exam: Appears calm and comfortable  Respiratory system: Clear to auscultation. Respiratory effort normal. Cardiovascular system: S1 & S2 heard, RRR. No JVD, murmurs, rubs, gallops or clicks. No pedal edema. Gastrointestinal system: Abdomen is nondistended, soft and mild tenderness at the incision site. No organomegaly or masses felt. Normal bowel sounds heard. Central nervous system: Alert and oriented. No focal neurological deficits. Extremities: Symmetric 5 x 5 power. Skin: No rashes, lesions or ulcers.  Psychiatry: Judgement and insight appear normal. Mood & affect appropriate.   Data Reviewed: I have personally reviewed following labs and imaging studies CBC: Recent Labs  Lab 08/26/20 0244 08/27/20 0448 08/28/20 0304 08/30/20 0440 08/31/20 0439  WBC 21.7* 18.8* 17.1* 19.9* 17.3*  NEUTROABS  --   --   --   --  11.3*  HGB 10.1* 10.4* 10.0* 10.3* 10.2*  HCT 32.5* 32.9* 32.0* 32.3* 32.1*  MCV 98.8 97.3 97.9 97.3 96.7  PLT 160 195 206 228 749   Basic Metabolic Panel: Recent Labs  Lab 08/27/20 0448 08/28/20 0304 08/29/20 0400 08/30/20 0440 08/31/20 0439  NA 138 137 136 133* 135  K 3.4* 4.1 4.1 3.9 3.9  CL 102 101 100 97* 99  CO2 29 28 29 30 29   GLUCOSE 102* 109* 99 113* 116*  BUN 18 17 16 17 18   CREATININE 0.34* 0.43* 0.36* 0.47 0.42*  CALCIUM 8.5* 8.2* 8.5* 8.3* 8.4*   MG 1.8 2.0 1.7 1.8 1.6*  PHOS 2.2* 3.5 2.5 2.9 2.8   GFR: Estimated Creatinine Clearance: 38.5 mL/min (A) (by C-G formula based on SCr of 0.42 mg/dL (L)). Liver Function Tests: Recent Labs  Lab 08/25/20 0231 08/26/20 0244 08/27/20 0448 08/31/20 0439  AST 15 16 27  89*  ALT 10 12 22  102*  ALKPHOS 45 51 59 75  BILITOT 0.6 0.6 0.5 0.6  PROT 4.9* 5.0* 4.8* 5.6*  ALBUMIN 2.2* 2.2* 2.0* 2.2*   No results for input(s): LIPASE, AMYLASE in the last 168 hours. No results for input(s): AMMONIA in the last 168 hours. Coagulation Profile: No results for input(s): INR, PROTIME in the last 168 hours. Cardiac Enzymes: No results for input(s): CKTOTAL, CKMB, CKMBINDEX, TROPONINI in the last 168 hours. BNP (last 3 results) Recent Labs    06/03/20 1001  PROBNP 100   HbA1C: No results for input(s): HGBA1C in the last 72 hours. CBG: Recent Labs  Lab 08/30/20 0529 08/30/20 1132 08/30/20 1628 08/31/20 0025 08/31/20 0551  GLUCAP 128* 144* 99 127* 133*   Lipid Profile: Recent Labs    08/31/20 0439  TRIG 89   Thyroid Function Tests: No results for input(s): TSH, T4TOTAL, FREET4, T3FREE, THYROIDAB in the last 72 hours. Anemia Panel: No results for input(s): VITAMINB12, FOLATE, FERRITIN, TIBC, IRON, RETICCTPCT in the last 72 hours. Sepsis Labs: Recent Labs  Lab 08/26/20 0450 08/27/20 0448 08/28/20 0304  PROCALCITON 0.19 <0.10 <0.10    Recent Results (from the past 240 hour(s))  Culture, Urine     Status: Abnormal   Collection Time: 08/21/20  5:40 PM   Specimen: Urine, Clean Catch  Result Value Ref Range Status   Specimen Description   Final    URINE, CLEAN CATCH Performed at [facility], [place] [address], [address], [place] [address]    Special Requests   Final    NONE Performed at [facility], [place] [address] [place], [place] [address]    Culture (A)  Final    >=100,000 COLONIES/mL PSEUDOMONAS AERUGINOSA >=100,000 COLONIES/mL  ENTEROCOCCUS FAECALIS    Report Status 08/25/2020 FINAL  Final   Organism ID, Bacteria PSEUDOMONAS AERUGINOSA (A)  Final   Organism ID, Bacteria ENTEROCOCCUS FAECALIS (A)  Final      Susceptibility   Enterococcus faecalis - MIC*    AMPICILLIN <=2 SENSITIVE Sensitive     NITROFURANTOIN <=16 SENSITIVE Sensitive     VANCOMYCIN 1 SENSITIVE Sensitive     * >=100,000 COLONIES/mL ENTEROCOCCUS FAECALIS   Pseudomonas aeruginosa - MIC*    CEFTAZIDIME 4 SENSITIVE Sensitive     CIPROFLOXACIN <=0.25 SENSITIVE Sensitive     GENTAMICIN 2 SENSITIVE Sensitive     IMIPENEM 1 SENSITIVE Sensitive     PIP/TAZO <=4 SENSITIVE Sensitive     * >=100,000 COLONIES/mL PSEUDOMONAS AERUGINOSA  MRSA PCR Screening     Status: Abnormal   Collection Time: 08/23/20  1:27 PM   Specimen: Nasal Mucosa; Nasopharyngeal  Result Value Ref Range Status   MRSA by PCR POSITIVE (A) NEGATIVE Final    Comment:        The GeneXpert MRSA Assay (FDA approved for NASAL specimens only), is one component of a comprehensive MRSA colonization surveillance program. It is not intended to diagnose MRSA infection nor to guide or monitor treatment for MRSA infections. RESULT CALLED TO, READ BACK BY AND VERIFIED WITH: CARTER,K RN @1459  ON 08/23/20 JACKSON,K Performed at [facility], [place] [address] [place], [place] [address]      Radiology Studies: No results found.   LOS: 14 days   Darliss Cheney, MD [facility]  08/31/2020, 11:05 AM

## 2020-08-31 NOTE — Care Management Important Message (Signed)
Important Message  Patient Details IM Letter given to the Patient. Name: [name] [name] MRN: [id number] Date of Birth: 01-14-1931   Medicare Important Message Given:  Yes     Kerin Salen 08/31/2020, 12:14 PM

## 2020-08-31 NOTE — Progress Notes (Signed)
PHARMACY - TOTAL PARENTERAL NUTRITION CONSULT NOTE   Indication: Small bowel obstruction  Patient Measurements: Height: 5' 4.5" (163.8 cm) Weight: 49.2 kg (108 lb 6.4 oz) IBW/kg (Calculated) : 55.85 TPN AdjBW (KG): 44.5 Body mass index is 18.32 kg/m.  Assessment: 87 yo female with persistent SBO that may be result of gallstone ileus. Multiple attempts at NGT have failed; per CCS, at this time no further options besides doing surgery although high risk. She is now s/p exploratory laparotomy, detorsion of small bowel, placement of Stamm gastrostomy tube on 08/23/20. TPN started on 2/12.  Glucose / Insulin: sSSI q6h (used 1 unit in 24 hrs) - CBG (goal 100-150): WNL - note patient on chronic steroids currently on prednisone 5 mg PO daily (home dose) Electrolytes: Mg (1.6) low; all other lytes WNL including CorrCa (9.8) Renal: SCr low, BUN WNL LFTs / TGs: AST 89/ALT 102 slightly elevated, TG WNL Prealbumin / albumin: Prealbumin now WNL; Alb remains low Intake / Output:  Unmeasured UOP - NGT removed MIVF:  NS at [facility] Imaging: 2/11 SBO per CT Surgeries / Procedures: -  2/13 ex lap, detorsion of small bowel, placement of G- tube  Central access: 2/12 - PICC TPN start date: 2/12  Nutritional Goals (per RD recommendation on 2/18): Severe malnutrition kCal: 1500-1700, Protein: 70-85, Fluid: >= 1.4L  Note that currently there is a Lipid emulsion shortage so SMOFlipid will only be added on MWF for appropriate patients  Goal TPN rate MWF with lipid (30gm/L)  is 60 mL/hr (provides 72 g of protein and 1699 kcals per day)  Goal TPN rate TuThSatSun without lipid is 60 ml/hr (provides 72g protein and 1267 kcal)   Current Nutrition:  - Advancing; tolerating soft diet - TPN at goal rate of 60 mL/hr - Boost BID, prosource BID  Plan: Decrease TPN to 1/2 of goal rate today per CCS  Now:  - Magnesium sulfate 2gm IV x1  At 1800:   Reduce TPN to 30 mL/hr (1/2 of goal rate)  Electrolytes  in TPN: No changes  150 mEq/L of Na  50 mEq/L of K   34mEq/L of Ca   7.5 mEq/L of Mg  20 mmol/L of Phos  Cl:Ac ratio 2:1  Add standard MVI and trace elements to TPN  Continue SSI with q6 hr CBG checks  Pepcid 20mg  per day in TPN  No lipids today as pt tolerating advancing diet, weaning TPN, and national shortage of lipid emulsion  Monitor TPN labs on Mon/Thurs and as needed  Lenis Noon, PharmD 08/31/2020,8:43 AM

## 2020-09-01 ENCOUNTER — Ambulatory Visit: Payer: Self-pay | Admitting: *Deleted

## 2020-09-01 DIAGNOSIS — K56609 Unspecified intestinal obstruction, unspecified as to partial versus complete obstruction: Secondary | ICD-10-CM | POA: Diagnosis not present

## 2020-09-01 LAB — CBC
HCT: 33.1 % — ABNORMAL LOW (ref 36.0–46.0)
Hemoglobin: 10.4 g/dL — ABNORMAL LOW (ref 12.0–15.0)
MCH: 30.6 pg (ref 26.0–34.0)
MCHC: 31.4 g/dL (ref 30.0–36.0)
MCV: 97.4 fL (ref 80.0–100.0)
Platelets: 299 10*3/uL (ref 150–400)
RBC: 3.4 MIL/uL — ABNORMAL LOW (ref 3.87–5.11)
RDW: 16.1 % — ABNORMAL HIGH (ref 11.5–15.5)
WBC: 18.8 10*3/uL — ABNORMAL HIGH (ref 4.0–10.5)
nRBC: 0 % (ref 0.0–0.2)

## 2020-09-01 LAB — BASIC METABOLIC PANEL
Anion gap: 6 (ref 5–15)
BUN: 18 mg/dL (ref 8–23)
CO2: 29 mmol/L (ref 22–32)
Calcium: 8.4 mg/dL — ABNORMAL LOW (ref 8.9–10.3)
Chloride: 100 mmol/L (ref 98–111)
Creatinine, Ser: 0.4 mg/dL — ABNORMAL LOW (ref 0.44–1.00)
GFR, Estimated: >60 mL/min (ref >60)
Glucose, Bld: 100 mg/dL — ABNORMAL HIGH (ref 70–99)
Potassium: 4.2 mmol/L (ref 3.5–5.1)
Sodium: 135 mmol/L (ref 135–145)

## 2020-09-01 LAB — GLUCOSE, CAPILLARY
Glucose-Capillary: 101 mg/dL — ABNORMAL HIGH (ref 70–99)
Glucose-Capillary: 108 mg/dL — ABNORMAL HIGH (ref 70–99)
Glucose-Capillary: 113 mg/dL — ABNORMAL HIGH (ref 70–99)
Glucose-Capillary: 138 mg/dL — ABNORMAL HIGH (ref 70–99)
Glucose-Capillary: 82 mg/dL (ref 70–99)

## 2020-09-01 LAB — PHOSPHORUS: Phosphorus: 3.2 mg/dL (ref 2.5–4.6)

## 2020-09-01 LAB — MAGNESIUM: Magnesium: 1.9 mg/dL (ref 1.7–2.4)

## 2020-09-01 MED ORDER — ENOXAPARIN SODIUM 30 MG/0.3ML ~~LOC~~ SOLN
30.0000 mg | SUBCUTANEOUS | Status: DC
  Administered 2020-09-01 – 2020-09-07 (×7): 30 mg via SUBCUTANEOUS
  Filled 2020-09-01 (×7): qty 0.3

## 2020-09-01 MED ORDER — FAMOTIDINE 20 MG PO TABS
20.0000 mg | ORAL_TABLET | Freq: Every day | ORAL | Status: DC
  Administered 2020-09-02 – 2020-09-07 (×6): 20 mg via ORAL
  Filled 2020-09-01 (×6): qty 1

## 2020-09-01 NOTE — Progress Notes (Signed)
PROGRESS NOTE    [name] [name]  NOM:[id number] DOB: 1930/08/30 DOA: 08/17/2020 PCP: Hoyt Koch, MD   Chief Complaint  Patient presents with   Abdominal Pain  Brief Narrative: 87 year old female with history of chronic hypoxic respiratory failure on 2 L nasal cannula secondary to ILD/pulmonary fibrosis,rheumatoid arthritis on chronic prednisone, CAD/CHF, dyslipidemia, history of DVT, presented with nausea vomiting abdominal pain, onset 3 days prior to admission.Patient seen in the ED 2/7 reported having a bowel movement a week prior to admission in the ED found to have SBO, fecal impaction disimpacted x2 with enema, CCS was consulted for SBO and was admitted for further management. 2/8- started on CLD, but had episode of vomiting on 2/9 - had Xray abd that showed normal bowel gas pattern. She is followed closely by general surgery.  Again had episode of vomiting 2/10 am, surgery planned for enema.Nephew wanted to look into second opinion at [facility]- called  and discussed w/ Transfer center , they are at capacity- was called back by Hospitalist from [facility] and they have declined transfer, nephew made aware of.2/11-GI also consulted, repeat CT abdomen done that showed -progressive small bowel obstruction with significant gastric and small bowel distention, transition point associated with an intraluminal lesion in the mid distal ileum in the mid pelvis, most likely a large gallstone ileus. 2/12: Multiple attempts for NG tube insertion has been failed-by nursing/surgeon as well as IR. 2/13: Patient with ongoing more abdominal pain significant tenderness and some confusion- s/p ex lap with detorsion of small intestine and transferred to ICU for postop ICU care Has been transferred out of ICU.  Bowel function improving 2/18- cld diet, G tube clamped.  Diet is being advanced and currently on soft diet.  CCS managing.  Subjective: Seen and examined.  Complains of pain at the G-tube site.  No other  complaint.  Assessment & Plan:  SBO/torsion of the small bowel mesentery: s/p exlap,detorsion of small bowel and placement of gastrostomy tube 2/13 ( Dr Zenia Resides).  G-tube remains clamped since 08/28/2020, tolerating soft diet.  Passing flatus.  TPN was cut in half yesterday.  CCS managing.  Hypotension postop in the setting of third spacing/Anaesthesia/dehydration.  Resolved.  Stress dose hydrocortisone transition to home dose prednisone  Acute on chronic hypoxic respiratory failure on 2 L : Respiratory status is stable.  This morning she was on room air and doing well.  ILD/pulmonary fibrosis/Rheumatoid arthritis on chronic prednisone, Ofev, CellCept/Remicade outpatient infusion: Resumed prednisone 10 mg 2/18 then from 2/19 home dose 5 mg. I had communicated to her Pulmonologist Dr Vaughan Browner and okay to hold her ILD meds for now.  Hx of CAD/CHF ( D) /Dyslipidemia: Remains euvolemic, no chest pain. Normally she takes Lasix 20 mg as needed for fluid or edema. She has not been taking aspirin and metoprolol at home prior to admission. Watch for fluid overload  UTI on admission UA grossly abnormal UA, repeat UA 2/12-grossly abnormal ( wbc>50,rbc>50, cloudy, LE trace) and urine culture >100,000 Pseudomonas aeruginosa and Enterococcus faecalis.  Seen by ID Dr Juleen China- discontinued antibiotics.  Denies any urinary symptoms.   Leukocytosis persistent, patient received ceftriaxone and completed.  Antibiotics were discontinued as per ID.  Leukocytosis improved somewhat yesterday.  She remains afebrile.  Recent Labs  Lab 08/27/20 0448 08/28/20 0304 08/30/20 0440 08/31/20 0439 09/01/20 1019  WBC 18.8* 17.1* 19.9* 17.3* 18.8*   History of DVT not on anticoagulation due to history of falls. Continue Lovenox prophylaxis  Normocytic anemia stable hb  at 10 gm Recent Labs  Lab 08/27/20 0448 08/28/20 0304 08/30/20 0440 08/31/20 0439 09/01/20 1019  HGB 10.4* 10.0* 10.3* 10.2* 10.4*  HCT 32.9* 32.0*  32.3* 32.1* 33.1*   Hypophosphatemia/hypomagnesemia, electrolytes improved.    Dehydration/ketosis in the urine, cont TPN until able to take p.o. well.  Goals of care full code.Multiple comorbidities,with significant pulmonary disease,chronic respiratory failure now with SBO-debilitated.palliative care was consulted.  Nutrition Status: Severe protein calorie malnourishment present.  Has been on TPN 2/12.  Pharmacy dietitian following. Nutrition Problem: Severe Malnutrition Etiology: chronic illness (ILD/pulmonary fibrosis) Signs/Symptoms: severe fat depletion,severe muscle depletion Interventions: Boost Breeze,Prostat  Diet Order            DIET SOFT Room service appropriate? Yes; Fluid consistency: Thin  Diet effective now                DVT prophylaxis: Lovenox Code Status:   Code Status: Full Code  Family Communication:  Her nephew has been updated previously and extensively. At this time awaiting on return of bowel fun.  Status is Inpatient Remains inpatient appropriate because:IV treatments appropriate due to intensity of illness or inability to take PO and Inpatient level of care appropriate due to severity of illness.  Dispo:The patient is from: Home            Anticipated d/c is to: SNF, PT OT reconsulted            Anticipated d/c date is: 2 to 3 days            Patient currently is not medically stable to d/c.            Difficult to place patient No   Consultants: CCS GI PALLIATIVE MEDICINE PCCM   Procedures: EX LAP 2/13  Culture/Microbiology Other culture-see note  Medications: Scheduled Meds:  (feeding supplement) PROSource Plus  30 mL Oral BID BM   acetaminophen  650 mg Oral Q6H   bisacodyl  10 mg Rectal Daily   chlorhexidine  15 mL Mouth Rinse BID   Chlorhexidine Gluconate Cloth  6 each Topical Daily   docusate sodium  100 mg Oral BID   enoxaparin (LOVENOX) injection  30 mg Subcutaneous Q24H   [START ON 09/02/2020] famotidine  20 mg Oral  Daily   feeding supplement  1 Container Oral BID BM   mouth rinse  15 mL Mouth Rinse q12n4p   methocarbamol  500 mg Oral Q8H   predniSONE  5 mg Oral Q breakfast   sodium chloride flush  10-40 mL Intracatheter Q12H   Continuous Infusions:  sodium chloride 1 mL (08/27/20 2216)   TPN ADULT (ION) 30 mL/hr at 09/01/20 0335    Antimicrobials: Anti-infectives (From admission, onward)   Start     Dose/Rate Route Frequency Ordered Stop   08/23/20 0930  ceFAZolin (ANCEF) IVPB 2g/100 mL premix  Status:  Discontinued       "And" Linked Group Details   2 g 200 mL/hr over 30 Minutes Intravenous On call to O.R. 08/23/20 6222 08/23/20 0841   08/23/20 0930  metroNIDAZOLE (FLAGYL) IVPB 500 mg       "And" Linked Group Details   500 mg 100 mL/hr over 60 Minutes Intravenous On call to O.R. 08/23/20 0841 08/23/20 1027   08/23/20 0923  ceFAZolin (ANCEF) 2-4 GM/100ML-% IVPB  Status:  Discontinued       Note to Pharmacy: Enrigue Catena   : cabinet override      08/23/20 [phone] 08/23/20 9211  08/19/20 1000  dapsone tablet 100 mg  Status:  Discontinued       Note to Pharmacy: TAKE 1 TABLET(100 MG) BY MOUTH DAILY     100 mg Oral Daily 08/18/20 1350 08/22/20 0805   08/18/20 1000  cefTRIAXone (ROCEPHIN) 1 g in sodium chloride 0.9 % 100 mL IVPB  Status:  Discontinued        1 g 200 mL/hr over 30 Minutes Intravenous Every 24 hours 08/17/20 1414 08/24/20 1209   08/17/20 0615  cefTRIAXone (ROCEPHIN) 1 g in sodium chloride 0.9 % 100 mL IVPB        1 g 200 mL/hr over 30 Minutes Intravenous  Once 08/17/20 0605 08/17/20 0801     Objective: Vitals: Today's Vitals   08/31/20 2257 09/01/20 0511 09/01/20 0607 09/01/20 0751  BP:  (!) 97/59    Pulse:  88    Resp:  20    Temp:  98.4 F (36.9 C)    TempSrc:      SpO2:  95%    Weight:   45.4 kg   Height:      PainSc: Asleep   5     Intake/Output Summary (Last 24 hours) at 09/01/2020 1124 Last data filed at 09/01/2020 1020 Gross per 24 hour  Intake 792.09  ml  Output 1200 ml  Net -407.91 ml   Filed Weights   08/30/20 0500 08/31/20 0500 09/01/20 0607  Weight: 48.8 kg 49.2 kg 45.4 kg   Weight change: -3.765 kg  Intake/Output from previous day: 02/21 0701 - 02/22 0700 In: 1402.8 [P.O.:480; I.V.:922.8] Out: 1200 [Urine:1200] Intake/Output this shift: Total I/O In: 10 [I.V.:10] Out: -  Filed Weights   08/30/20 0500 08/31/20 0500 09/01/20 0607  Weight: 48.8 kg 49.2 kg 45.4 kg   Examination:  General exam: Appears calm and comfortable  Respiratory system: Clear to auscultation. Respiratory effort normal. Cardiovascular system: S1 & S2 heard, RRR. No JVD, murmurs, rubs, gallops or clicks. No pedal edema. Gastrointestinal system: Abdomen is nondistended, soft and tender at G-tube site and incision site. No organomegaly or masses felt. Normal bowel sounds heard. Central nervous system: Alert and oriented. No focal neurological deficits. Extremities: Symmetric 5 x 5 power. Skin: No rashes, lesions or ulcers.  Psychiatry: Judgement and insight appear normal. Mood & affect appropriate.   Data Reviewed: I have personally reviewed following labs and imaging studies CBC: Recent Labs  Lab 08/27/20 0448 08/28/20 0304 08/30/20 0440 08/31/20 0439 09/01/20 1019  WBC 18.8* 17.1* 19.9* 17.3* 18.8*  NEUTROABS  --   --   --  11.3*  --   HGB 10.4* 10.0* 10.3* 10.2* 10.4*  HCT 32.9* 32.0* 32.3* 32.1* 33.1*  MCV 97.3 97.9 97.3 96.7 97.4  PLT 195 206 228 257 093   Basic Metabolic Panel: Recent Labs  Lab 08/28/20 0304 08/29/20 0400 08/30/20 0440 08/31/20 0439 09/01/20 0400  NA 137 136 133* 135 135  K 4.1 4.1 3.9 3.9 4.2  CL 101 100 97* 99 100  CO2 28 29 30 29 29   GLUCOSE 109* 99 113* 116* 100*  BUN 17 16 17 18 18   CREATININE 0.43* 0.36* 0.47 0.42* 0.40*  CALCIUM 8.2* 8.5* 8.3* 8.4* 8.4*  MG 2.0 1.7 1.8 1.6* 1.9  PHOS 3.5 2.5 2.9 2.8 3.2   GFR: Estimated Creatinine Clearance: 35.5 mL/min (A) (by C-G formula based on SCr of 0.4  mg/dL (L)). Liver Function Tests: Recent Labs  Lab 08/26/20 0244 08/27/20 0448 08/31/20 0439  AST 16 27  89*  ALT 12 22 102*  ALKPHOS 51 59 75  BILITOT 0.6 0.5 0.6  PROT 5.0* 4.8* 5.6*  ALBUMIN 2.2* 2.0* 2.2*   No results for input(s): LIPASE, AMYLASE in the last 168 hours. No results for input(s): AMMONIA in the last 168 hours. Coagulation Profile: No results for input(s): INR, PROTIME in the last 168 hours. Cardiac Enzymes: No results for input(s): CKTOTAL, CKMB, CKMBINDEX, TROPONINI in the last 168 hours. BNP (last 3 results) Recent Labs    06/03/20 1001  PROBNP 100   HbA1C: No results for input(s): HGBA1C in the last 72 hours. CBG: Recent Labs  Lab 08/31/20 1207 08/31/20 1759 08/31/20 2200 09/01/20 0010 09/01/20 0627  GLUCAP 153* 112* 107* 108* 101*   Lipid Profile: Recent Labs    08/31/20 0439  TRIG 89   Thyroid Function Tests: No results for input(s): TSH, T4TOTAL, FREET4, T3FREE, THYROIDAB in the last 72 hours. Anemia Panel: No results for input(s): VITAMINB12, FOLATE, FERRITIN, TIBC, IRON, RETICCTPCT in the last 72 hours. Sepsis Labs: Recent Labs  Lab 08/26/20 0450 08/27/20 0448 08/28/20 0304  PROCALCITON 0.19 <0.10 <0.10    Recent Results (from the past 240 hour(s))  MRSA PCR Screening     Status: Abnormal   Collection Time: 08/23/20  1:27 PM   Specimen: Nasal Mucosa; Nasopharyngeal  Result Value Ref Range Status   MRSA by PCR POSITIVE (A) NEGATIVE Final    Comment:        The GeneXpert MRSA Assay (FDA approved for NASAL specimens only), is one component of a comprehensive MRSA colonization surveillance program. It is not intended to diagnose MRSA infection nor to guide or monitor treatment for MRSA infections. RESULT CALLED TO, READ BACK BY AND VERIFIED WITH: CARTER,K RN @1459  ON 08/23/20 JACKSON,K Performed at [facility], [place] [address] [place], [place] [address]      Radiology Studies: No results  found.   LOS: 15 days   Darliss Cheney, MD [facility]  09/01/2020, 11:24 AM

## 2020-09-01 NOTE — Progress Notes (Signed)
Pharmacy Brief Note:  TBW = 45 kg, CrCl 35 mL/min  Will reduce enoxaparin to 30 mg subQ daily for DVT ppx for TBW 45 kg.  Lenis Noon, PharmD 09/01/20 8:18 AM

## 2020-09-01 NOTE — Progress Notes (Signed)
PHARMACY - TOTAL PARENTERAL NUTRITION CONSULT NOTE   Indication: Small bowel obstruction  Patient Measurements: Height: 5' 4.5" (163.8 cm) Weight: 45.4 kg (100 lb 1.6 oz) IBW/kg (Calculated) : 55.85 TPN AdjBW (KG): 44.5 Body mass index is 16.92 kg/m.  Assessment: 87 yo female with persistent SBO that may be result of gallstone ileus. Multiple attempts at NGT have failed; per CCS, at this time no further options besides doing surgery although high risk. She is now s/p exploratory laparotomy, detorsion of small bowel, placement of Stamm gastrostomy tube on 08/23/20. TPN started on 2/12.  Glucose / Insulin: sSSI q6h (used 2 unit in 24 hrs) - CBG (goal <180): WNL - note patient on chronic steroids currently on prednisone 5 mg PO daily (home dose) Electrolytes: all lytes WNL including CorrCa (9.8) Renal: SCr low, BUN WNL LFTs / TGs: AST 89/ALT 102 slightly elevated, TG WNL Prealbumin / albumin: Prealbumin now WNL; Alb remains low Intake / Output: UOP 1200 mL - NGT removed MIVF:  NS at [facility] Imaging: 2/11 SBO per CT Surgeries / Procedures: -  2/13 ex lap, detorsion of small bowel, placement of G- tube  Central access: 2/12 - PICC TPN start date: 2/12  Nutritional Goals (per RD recommendation on 2/18): kCal: 1500-1700, Protein: 70-85, Fluid: >= 1.4L  Note that currently there is a Lipid emulsion shortage so SMOFlipid will only be added on MWF for appropriate patients  Goal TPN rate MWF with lipid (30gm/L)  is 60 mL/hr (provides 72 g of protein and 1699 kcals per day)  Goal TPN rate TuThSatSun without lipid is 60 ml/hr (provides 72g protein and 1267 kcal)  Current Nutrition:  - Advancing; tolerating soft diet - TPN at 1/2 of goal rate (30 mL/hr) - Boost BID, prosource BID  Plan: Wean off of TPN today. Discussed with CCS  Now:   Discontinue SSI  TPN currently at 1/2 of goal rate. Allow it to run until 1800 tonight. TPN off at that time  All CBG checks, TPN labs, and  associated orders will be discontinued  Order famotidine 20 mg PO daily  Lenis Noon, PharmD 09/01/2020,8:10 AM

## 2020-09-01 NOTE — Plan of Care (Signed)
  Problem: Clinical Measurements: Goal: Will remain free from infection Outcome: Progressing   Problem: Activity: Goal: Risk for activity intolerance will decrease Outcome: Progressing   Problem: Nutrition: Goal: Adequate nutrition will be maintained Outcome: Progressing   Problem: Pain Managment: Goal: General experience of comfort will improve Outcome: Progressing   Problem: Safety: Goal: Ability to remain free from injury will improve Outcome: Progressing   Problem: Skin Integrity: Goal: Risk for impaired skin integrity will decrease Outcome: Progressing   

## 2020-09-01 NOTE — Progress Notes (Signed)
[facility] Progress Note  9 Days Post-Op  Subjective: CC-  Complaining of pain around her gastrostomy tube site, improved last night with Robaxin.  Staples removed from lower aspect of incision yesterday without any drainage, incision healing well.  Objective: Vital signs in last 24 hours: Temp:  [97.7 F (36.5 C)-98.6 F (37 C)] 98.4 F (36.9 C) (02/22 0511) Pulse Rate:  [84-89] 88 (02/22 0511) Resp:  [16-20] 20 (02/22 0511) BP: (95-106)/(49-69) 97/59 (02/22 0511) SpO2:  [95 %-98 %] 95 % (02/22 0511) Weight:  [45.4 kg] 45.4 kg (02/22 0607) Last BM Date: 08/30/20  Intake/Output from previous day: 02/21 0701 - 02/22 0700 In: 1402.8 [P.O.:480; I.V.:922.8] Out: 1200 [Urine:1200] Intake/Output this shift: No intake/output data recorded.  PE: Gen: Alert,NAD Pulm:rate and effort normal Abd: Soft,minimal distension, +BS, midline incision cdi with staples present - trace erythema at site of staple insertion otherwise no diffuse cellulitis and no drainage, lower few staples have been removed with no drainage in this area. G tube sitewith scant drainage and tube isclamped  Lab Results:  Recent Labs    08/30/20 0440 08/31/20 0439  WBC 19.9* 17.3*  HGB 10.3* 10.2*  HCT 32.3* 32.1*  PLT 228 257   BMET Recent Labs    08/31/20 0439 09/01/20 0400  NA 135 135  K 3.9 4.2  CL 99 100  CO2 29 29  GLUCOSE 116* 100*  BUN 18 18  CREATININE 0.42* 0.40*  CALCIUM 8.4* 8.4*   PT/INR No results for input(s): LABPROT, INR in the last 72 hours. CMP     Component Value Date/Time   NA 135 09/01/2020 0400   NA 138 04/29/2020 0948   K 4.2 09/01/2020 0400   CL 100 09/01/2020 0400   CO2 29 09/01/2020 0400   GLUCOSE 100 (H) 09/01/2020 0400   BUN 18 09/01/2020 0400   BUN 11 04/29/2020 0948   CREATININE 0.40 (L) 09/01/2020 0400   CREATININE 0.74 11/30/2015 1327   CALCIUM 8.4 (L) 09/01/2020 0400   PROT 5.6 (L) 08/31/2020 0439   PROT 6.5 04/29/2020 0948   ALBUMIN  2.2 (L) 08/31/2020 0439   ALBUMIN 3.8 04/29/2020 0948   AST 89 (H) 08/31/2020 0439   ALT 102 (H) 08/31/2020 0439   ALKPHOS 75 08/31/2020 0439   BILITOT 0.6 08/31/2020 0439   BILITOT 0.7 04/29/2020 0948   GFRNONAA >60 09/01/2020 0400   GFRAA 69 04/29/2020 0948   Lipase     Component Value Date/Time   LIPASE 27 08/17/2020 0410       Studies/Results: No results found.  Anti-infectives: Anti-infectives (From admission, onward)   Start     Dose/Rate Route Frequency Ordered Stop   08/23/20 0930  ceFAZolin (ANCEF) IVPB 2g/100 mL premix  Status:  Discontinued       "And" Linked Group Details   2 g 200 mL/hr over 30 Minutes Intravenous On call to O.R. 08/23/20 2376 08/23/20 0841   08/23/20 0930  metroNIDAZOLE (FLAGYL) IVPB 500 mg       "And" Linked Group Details   500 mg 100 mL/hr over 60 Minutes Intravenous On call to O.R. 08/23/20 0841 08/23/20 1027   08/23/20 0923  ceFAZolin (ANCEF) 2-4 GM/100ML-% IVPB  Status:  Discontinued       Note to Pharmacy: Enrigue Catena   : cabinet override      08/23/20 0923 08/23/20 0938   08/19/20 1000  dapsone tablet 100 mg  Status:  Discontinued       Note to  Pharmacy: TAKE 1 TABLET(100 MG) BY MOUTH DAILY     100 mg Oral Daily 08/18/20 1350 08/22/20 0805   08/18/20 1000  cefTRIAXone (ROCEPHIN) 1 g in sodium chloride 0.9 % 100 mL IVPB  Status:  Discontinued        1 g 200 mL/hr over 30 Minutes Intravenous Every 24 hours 08/17/20 1414 08/24/20 1209   08/17/20 0615  cefTRIAXone (ROCEPHIN) 1 g in sodium chloride 0.9 % 100 mL IVPB        1 g 200 mL/hr over 30 Minutes Intravenous  Once 08/17/20 3668 08/17/20 0801       Assessment/Plan Pulmonary fibrosis/interstitial lung disease Rheumatoid arthritis - cellcept/Remicade/predinsone - prednisone 5mg  qd currently Hx CAD/CHF Hx of DVT  Anemia Severeprotein-caloriemalnutrition- prealbumin 6.0 (2/14), up to 21.4 (2/21) UTI > 100K Pseudomonas  SBO/Torsion of small bowel  mesentary S/pExploratory laparotomy, detorsion of small bowel, placement of Stamm Gastrostomy tube, 08/23/20, Michaelle Birks - POD#9 - Tolerating some soft diet and having bowel function. G tube is clamped. Decrease TPN to 1/2 rate and encourage PO intake. Dietician following  -Keep G-tube dressing clean and dry, keep G-tube perpendicular with the skin to wait erosion of the wound. - WBC remains elevated yesterday, recheck this morning pending  - Continue PT/OT, mobilize.  FEN: 1/2TPN;clampG-tube, soft diet, Boost ID: rocephin 2/7- 2/12 DVT: ok for chemical DVT prophylaxis from surgical standpoint Follow up: staple removal ~POD#14, Dr. Zenia Resides Foley:out   LOS: 15 days    Felicie Morn, [facility] 09/01/2020, 9:04 AM Please see Amion for pager number during day hours 7:00am-4:30pm

## 2020-09-02 DIAGNOSIS — E43 Unspecified severe protein-calorie malnutrition: Secondary | ICD-10-CM | POA: Diagnosis not present

## 2020-09-02 DIAGNOSIS — K56609 Unspecified intestinal obstruction, unspecified as to partial versus complete obstruction: Secondary | ICD-10-CM | POA: Diagnosis not present

## 2020-09-02 DIAGNOSIS — J849 Interstitial pulmonary disease, unspecified: Secondary | ICD-10-CM | POA: Diagnosis not present

## 2020-09-02 LAB — COMPREHENSIVE METABOLIC PANEL
ALT: 152 U/L — ABNORMAL HIGH (ref 0–44)
AST: 101 U/L — ABNORMAL HIGH (ref 15–41)
Albumin: 2.4 g/dL — ABNORMAL LOW (ref 3.5–5.0)
Alkaline Phosphatase: 88 U/L (ref 38–126)
Anion gap: 9 (ref 5–15)
BUN: 18 mg/dL (ref 8–23)
CO2: 26 mmol/L (ref 22–32)
Calcium: 8.4 mg/dL — ABNORMAL LOW (ref 8.9–10.3)
Chloride: 99 mmol/L (ref 98–111)
Creatinine, Ser: 0.53 mg/dL (ref 0.44–1.00)
GFR, Estimated: >60 mL/min (ref >60)
Glucose, Bld: 82 mg/dL (ref 70–99)
Potassium: 4.1 mmol/L (ref 3.5–5.1)
Sodium: 134 mmol/L — ABNORMAL LOW (ref 135–145)
Total Bilirubin: 0.4 mg/dL (ref 0.3–1.2)
Total Protein: 5.8 g/dL — ABNORMAL LOW (ref 6.5–8.1)

## 2020-09-02 LAB — CBC
HCT: 31.2 % — ABNORMAL LOW (ref 36.0–46.0)
Hemoglobin: 9.9 g/dL — ABNORMAL LOW (ref 12.0–15.0)
MCH: 30.7 pg (ref 26.0–34.0)
MCHC: 31.7 g/dL (ref 30.0–36.0)
MCV: 96.9 fL (ref 80.0–100.0)
Platelets: 335 10*3/uL (ref 150–400)
RBC: 3.22 MIL/uL — ABNORMAL LOW (ref 3.87–5.11)
RDW: 16.2 % — ABNORMAL HIGH (ref 11.5–15.5)
WBC: 17.9 10*3/uL — ABNORMAL HIGH (ref 4.0–10.5)
nRBC: 0 % (ref 0.0–0.2)

## 2020-09-02 NOTE — Progress Notes (Signed)
Physical Therapy Treatment Patient Details Name: [name] [name] MRN: [id number] DOB: 02/02/1931 Today's Date: 09/02/2020    History of Present Illness 87 yo female admitted with SBO, UTI,   S/P Exploratory laparotomy, detorsion of small bowel, placement of Stamm Gastrostomy tube, 08/23/20. Hx of falls, pulm fibrosis, chronic respiratory failure-O2 dep 2L, DVT, osteoporosis, RA, DJD, CAD, CHF    PT Comments    The patient moves slowly, takes extra time. Patient  Did ambulate x 25' using Rw. Left up in recliner, patient expecting a visitor.   Continue PT for increased activity. Patient reports abdominal pain burt willing to mobilize.   HR 101 post amb. Ambulated on 2 L.   Follow Up Recommendations  SNF     Equipment Recommendations  None recommended by PT    Recommendations for Other Services       Precautions / Restrictions Precautions Precautions: Fall Precaution Comments: O2 dep,  gastrostomy tube drain, Required Braces or Orthoses: Other Brace Other Brace: needs ABD binder that fits    Mobility  Bed Mobility   Bed Mobility: Supine to Sit     Supine to sit: Min guard          Transfers   Equipment used: Rolling walker (2 wheeled) Transfers: Sit to/from Stand Sit to Stand: Min assist         General transfer comment: 25% VC's on proper hand placement and safety with turns  Ambulation/Gait Ambulation/Gait assistance: Min assist Gait Distance (Feet): 25 Feet Assistive device: Rolling walker (2 wheeled) Gait Pattern/deviations: Decreased stride length Gait velocity: decreased   General Gait Details: steady assist , slow gait. Cues for position inside RW.   Stairs             Wheelchair Mobility    Modified Rankin (Stroke Patients Only)       Balance Overall balance assessment: Needs assistance Sitting-balance support: Feet supported Sitting balance-[name]: Good     Standing balance support: Bilateral upper extremity  supported Standing balance-[name]: Fair                              Cognition Arousal/Alertness: Awake/alert Behavior During Therapy: WFL for tasks assessed/performed                                   General Comments: AxO x 3 very pleasant      Exercises      General Comments        Pertinent Vitals/Pain Faces Pain Scale: Hurts even more Pain Location: abdomen Pain Descriptors / Indicators: Discomfort;Grimacing;Guarding Pain Intervention(s): Repositioned;Monitored during session    Home Living                      Prior Function            PT Goals (current goals can now be found in the care plan section) Acute Rehab PT Goals Patient Stated Goal: return to independence PT Goal Formulation: With patient Time For Goal Achievement: 09/15/20 Potential to Achieve Goals: Fair Progress towards PT goals: Progressing toward goals    Frequency    Min 2X/week      PT Plan Current plan remains appropriate;Frequency needs to be updated;Discharge plan needs to be updated    Co-evaluation              AM-PAC PT "  6 Clicks" Mobility   Outcome Measure  Help needed turning from your back to your side while in a flat bed without using bedrails?: A Little Help needed moving from lying on your back to sitting on the side of a flat bed without using bedrails?: A Little Help needed moving to and from a bed to a chair (including a wheelchair)?: A Little Help needed standing up from a chair using your arms (e.g., wheelchair or bedside chair)?: A Little Help needed to walk in hospital room?: A Little Help needed climbing 3-5 steps with a railing? : A Lot 6 Click Score: 17    End of Session   Activity Tolerance: Patient tolerated treatment well Patient left: in chair;with call bell/phone within reach Nurse Communication: Mobility status PT Visit Diagnosis: Muscle weakness (generalized) (M62.81);Unsteadiness on feet (R26.81)      Time: 8251-8984 PT Time Calculation (min) (ACUTE ONLY): 27 min  Charges:  $Gait Training: 23-37 mins                     [place] Pager [phone] Office [phone]    Claretha Cooper 09/02/2020, 1:31 PM

## 2020-09-02 NOTE — Progress Notes (Signed)
On  Rounding noted copious amount of bloody purlent drainage from bottom of incision where small notch. Opening at bottom of the incision. Cleaned with NS applied 4X4 with ABD pad and mepior tape. Will cont to assess incision site.

## 2020-09-02 NOTE — Progress Notes (Signed)
PROGRESS NOTE    [name] [name]  IDP:[id number] DOB: December 24, 1930 DOA: 08/17/2020 PCP: Hoyt Koch, MD   Chief Complaint  Patient presents with  . Abdominal Pain    Brief Narrative:  Prior history of chronic hypoxic respiratory failure on 2 L of nasal cannula secondary to interstitial lung disease/pulmonary fibrosis, rheumatoid arthritis, on chronic prednisone, coronary artery disease, CHF, dyslipidemia, history of DVT presents with nausea vomiting and abdominal pain for 3 days prior to admission.  She was found to have SBO/torsion of the small bowel mesentery and underwent exploratory laparotomy and placement of gastrostomy tube on 08/23/2020.  She was slowly started on diet as her bowel function regained. Overnight patient continues to have purulent discharge the distal end of the abdominal incision. Patient reports pain at the incision site. She denies any nausea or vomiting at this time. Assessment & Plan:   Active Problems:   ILD (interstitial lung disease) (HCC)   SBO (small bowel obstruction) (HCC)   Protein-calorie malnutrition, severe   Acute on chronic respiratory failure with hypoxemia (HCC)   Hypotension after procedure   Hypernatremia  SBO/small bowel mesentery torsion S/p exploratory laparotomy, detorsion of the small bowel, placement of Stamm gastrostomy tube on 08/23/2020 by Dr. Michaelle Birks Bowel function regained and she was started on diet.  Distal aspect of the wound opened up and she has purulent discharge which will need 2-3 times of packing. G-tube is clamped, dressing to keep it dry and clean. Therapy evaluation recommending SNF.     Acute on chronic respiratory failure, at baseline patient is on 2 L of nasal cannula oxygen. Patient currently at baseline and doing well.   History of interstitial lung disease/pulmonary fibrosis/rheumatoid arthritis on prednisone, Ofev, CellCept and Remicade outpatient infusion.  Currently holding her ILD meds for  now.    Hypophosphatemia and hypomagnesemia Replaced.   History of coronary artery disease, diastolic heart failure Patient appears euvolemic.  Watch for signs of fluid overload.   Postop hypotension in the setting of third space/dehydration resolved.    Urinary tract infection Growing about 100,000 Pseudomonas aeruginosa and Enterococcus faecalis Completed the course of antibiotics. ID on board and recommended discontinuing antibiotics at this time.   DVT prophylaxis: (Lovenox) Code Status: Full code Family Communication: None at bedside Disposition:   Status is: Inpatient  Remains inpatient appropriate because:Ongoing diagnostic testing needed not appropriate for outpatient work up, Unsafe d/c plan and Inpatient level of care appropriate due to severity of illness   Dispo: The patient is from: Home              Anticipated d/c is to: SNF              Anticipated d/c date is: 3 days              Patient currently is not medically stable to d/c.   Difficult to place patient No       Consultants:   General surgery  Palliative care   Procedures: S/pExploratory laparotomy, detorsion of small bowel, placement of Stamm Gastrostomy tube, 08/23/20, Michaelle Birks  Antimicrobials: Antibiotics Given (last 72 hours)    None       Subjective: Patient reports abdominal pain  Objective: Vitals:   09/01/20 2106 09/02/20 0432 09/02/20 0500 09/02/20 0905  BP: 103/60 (!) 103/52  (!) 101/56  Pulse: 87 88  82  Resp: 16 18  18   Temp: 98.6 F (37 C) 98.6 F (37 C)  98.2 F (36.8 C)  TempSrc: Oral Oral  Oral  SpO2: 95% 95%  100%  Weight:   46.3 kg   Height:        Intake/Output Summary (Last 24 hours) at 09/02/2020 1315 Last data filed at 09/02/2020 0600 Gross per 24 hour  Intake 170 ml  Output 550 ml  Net -380 ml   Filed Weights   08/31/20 0500 09/01/20 0607 09/02/20 0500  Weight: 49.2 kg 45.4 kg 46.3 kg    Examination:  General exam: Appears calm and  comfortable  Respiratory system: Clear to auscultation. Respiratory effort normal. Cardiovascular system: S1 & S2 heard, RRR. No JVD,No pedal edema. Gastrointestinal system: Abdomen is soft, g tube in place, wound dehiscence at the distal aspect with purulent drainage Central nervous system: Alert and oriented. No focal neurological deficits. Extremities: Symmetric 5 x 5 power. Skin: No rashes, lesions or ulcers Psychiatry:  Mood & affect appropriate.     Data Reviewed: I have personally reviewed following labs and imaging studies  CBC: Recent Labs  Lab 08/28/20 0304 08/30/20 0440 08/31/20 0439 09/01/20 1019 09/02/20 0412  WBC 17.1* 19.9* 17.3* 18.8* 17.9*  NEUTROABS  --   --  11.3*  --   --   HGB 10.0* 10.3* 10.2* 10.4* 9.9*  HCT 32.0* 32.3* 32.1* 33.1* 31.2*  MCV 97.9 97.3 96.7 97.4 96.9  PLT 206 228 257 299 448    Basic Metabolic Panel: Recent Labs  Lab 08/28/20 0304 08/29/20 0400 08/30/20 0440 08/31/20 0439 09/01/20 0400 09/02/20 0412  NA 137 136 133* 135 135 134*  K 4.1 4.1 3.9 3.9 4.2 4.1  CL 101 100 97* 99 100 99  CO2 28 29 30 29 29 26   GLUCOSE 109* 99 113* 116* 100* 82  BUN 17 16 17 18 18 18   CREATININE 0.43* 0.36* 0.47 0.42* 0.40* 0.53  CALCIUM 8.2* 8.5* 8.3* 8.4* 8.4* 8.4*  MG 2.0 1.7 1.8 1.6* 1.9  --   PHOS 3.5 2.5 2.9 2.8 3.2  --     GFR: Estimated Creatinine Clearance: 36.2 mL/min (by C-G formula based on SCr of 0.53 mg/dL).  Liver Function Tests: Recent Labs  Lab 08/27/20 0448 08/31/20 0439 09/02/20 0412  AST 27 89* 101*  ALT 22 102* 152*  ALKPHOS 59 75 88  BILITOT 0.5 0.6 0.4  PROT 4.8* 5.6* 5.8*  ALBUMIN 2.0* 2.2* 2.4*    CBG: Recent Labs  Lab 09/01/20 0010 09/01/20 0627 09/01/20 1206 09/01/20 1811 09/01/20 2323  GLUCAP 108* 101* 113* 138* 82     Recent Results (from the past 240 hour(s))  MRSA PCR Screening     Status: Abnormal   Collection Time: 08/23/20  1:27 PM   Specimen: Nasal Mucosa; Nasopharyngeal  Result Value  Ref Range Status   MRSA by PCR POSITIVE (A) NEGATIVE Final    Comment:        The GeneXpert MRSA Assay (FDA approved for NASAL specimens only), is one component of a comprehensive MRSA colonization surveillance program. It is not intended to diagnose MRSA infection nor to guide or monitor treatment for MRSA infections. RESULT CALLED TO, READ BACK BY AND VERIFIED WITH: CARTER,K RN @1459  ON 08/23/20 JACKSON,K Performed at [facility], [place] [address], [place], [place] [address]          Radiology Studies: No results found.      Scheduled Meds: . (feeding supplement) PROSource Plus  30 mL Oral BID BM  . acetaminophen  650 mg Oral Q6H  . bisacodyl  10 mg Rectal Daily  . chlorhexidine  15 mL Mouth Rinse BID  . Chlorhexidine Gluconate Cloth  6 each Topical Daily  . docusate sodium  100 mg Oral BID  . enoxaparin (LOVENOX) injection  30 mg Subcutaneous Q24H  . famotidine  20 mg Oral Daily  . feeding supplement  1 Container Oral BID BM  . mouth rinse  15 mL Mouth Rinse q12n4p  . methocarbamol  500 mg Oral Q8H  . predniSONE  5 mg Oral Q breakfast  . sodium chloride flush  10-40 mL Intracatheter Q12H   Continuous Infusions: . sodium chloride 1 mL (08/27/20 2216)     LOS: 16 days        Hosie Poisson, MD [facility]   To contact the attending provider between 7A-7P or the covering provider during after hours 7P-7A, please log into the web site [email or web address] and access using universal [place] password for that web site. If you do not have the password, please call the hospital operator.  09/02/2020, 1:15 PM

## 2020-09-02 NOTE — Discharge Instructions (Addendum)
WOUND CARE: - Wound is mostly closed with staples. Distal aspect open and requires packing. Open 4 x 4 and pack into the open wound, try to get some of it up the incision tract, and let it wick the site.  Hopefully this will close in the next week or so.   - Change dressing twice daily - Supplies: sterile saline, gauze, scissors, ABD pads, tape  1. Remove dressing and all packing carefully, moistening with sterile saline as needed to avoid packing/internal dressing sticking to the wound.  2.   Clean edges of skin around the wound with water/gauze, making sure there is no tape debris or leakage left on skin that could cause skin irritation or breakdown. 3.   Dampen and clean gauze with sterile saline and pack wound from wound base to skin level, making sure to take note of any possible areas of wound tracking, tunneling and packing appropriately. Wound can be packed loosely. Trim gauze to size if a whole gauze is not required. 4.   Cover wound with a dry ABD pad and secure with tape.  5.   Write the date/time on the dry dressing/tape to better track when the last dressing change occurred. - apply any skin protectant/powder if recommended by clinician to protect skin/skin folds. - change dressing as needed if leakage occurs, wound gets contaminated, or patient requests to shower. - You may shower daily with wound open and following the shower the wound should be dried and a clean dressing placed.  - Medical grade tape as well as packing supplies can be found at [employer] on [address] or [employer] on [address]. The remaining supplies can be found at your local drug store, [employer] etc.  Gastrostomy tube site: Clean the site twice a day with soap and water.  Keep a dry dressing over the site.  Paper tape only on her good skin.  Drainage from the gastrostomy tube should resolve in the next few days.  [facility], [place] [phone]  OPEN ABDOMINAL  SURGERY: POST OP INSTRUCTIONS  Always review your discharge instruction sheet given to you by the facility where your surgery was performed.  IF YOU HAVE DISABILITY OR FAMILY LEAVE FORMS, YOU MUST BRING THEM TO THE OFFICE FOR PROCESSING.  PLEASE DO NOT GIVE THEM TO YOUR DOCTOR.  1. A prescription for pain medication may be given to you upon discharge.  Take your pain medication as prescribed, if needed.  If narcotic pain medicine is not needed, then you may take acetaminophen (Tylenol) or ibuprofen (Advil) as needed. 2. Take your usually prescribed medications unless otherwise directed. 3. If you need a refill on your pain medication, please contact your pharmacy. They will contact our office to request authorization.  Prescriptions will not be filled after 5pm or on week-ends. 4. You should follow a light diet the first few days after arrival home, such as soup and crackers, pudding, etc.unless your doctor has advised otherwise. A high-fiber, low fat diet can be resumed as tolerated.   Be sure to include lots of fluids daily. Most patients will experience some swelling and bruising on the chest and neck area.  Ice packs will help.  Swelling and bruising can take several days to resolve 5. Most patients will experience some swelling and bruising in the area of the incision. Ice pack will help. Swelling and bruising can take several days to resolve..  6. It is common to experience some  constipation if taking pain medication after surgery.  Increasing fluid intake and taking a stool softener will usually help or prevent this problem from occurring.  A mild laxative (Milk of Magnesia or Miralax) should be taken according to package directions if there are no bowel movements after 48 hours. 7.  ACTIVITIES:  You may resume regular (light) daily activities beginning the next day--such as daily self-care, walking, climbing stairs--gradually increasing activities as tolerated.  You may have sexual intercourse  when it is comfortable.  Refrain from any heavy lifting or straining until approved by your doctor. a. You may drive when you no longer are taking prescription pain medication, you can comfortably wear a seatbelt, and you can safely maneuver your car and apply brakes 8. You should see your doctor in the office for a follow-up appointment approximately two weeks after your surgery.  Make sure that you call for this appointment within a day or two after you arrive home to insure a convenient appointment time. 9.   WHEN TO CALL YOUR DOCTOR: 1. Fever over 101.0 2. Inability to urinate 3. Nausea and/or vomiting 4. Extreme swelling or bruising 5. Continued bleeding from incision. 6. Increased pain, redness, or drainage from the incision. 7. Difficulty swallowing or breathing 8. Muscle cramping or spasms. 9. Numbness or tingling in hands or feet or around lips.  The clinic staff is available to answer your questions during regular business hours.  Please don't hesitate to call and ask to speak to one of the nurses if you have concerns.  For further questions, please visit [email or web address]

## 2020-09-02 NOTE — NC FL2 (Signed)
[place] LEVEL OF CARE SCREENING TOOL     IDENTIFICATION  Patient Name: [name] [name] Birthdate: April 04, 1931 Sex: female Admission Date (Current Location): 08/17/2020  [facility] and [place] Number:  Herbalist and Address:  [facility],  [place] [address], [place]      Provider Number: [id number]  Attending Physician Name and Address:  Hosie Poisson, MD  Relative Name and Phone Number:  Leonard Schwartz [phone]  [phone]  Vinnie Level Relative [phone]  [phone]  Haze Boyden [phone]  [phone]    Current Level of Care: Hospital Recommended Level of Care: [place] Prior Approval Number:    Date Approved/Denied:   PASRR Number: [id number]  Discharge Plan: SNF    Current Diagnoses: Patient Active Problem List   Diagnosis Date Noted  . Acute on chronic respiratory failure with hypoxemia ([place]) 08/23/2020  . Hypotension after procedure 08/23/2020  . Hypernatremia 08/23/2020  . Protein-calorie malnutrition, severe 08/20/2020  . SBO (small bowel obstruction) ([place]) 08/17/2020  . Fall 01/25/2020  . Other fatigue 01/03/2020  . Left shoulder pain 01/03/2020  . Urinary frequency 01/03/2020  . Acute otalgia, left 09/06/2019  . Unintentional weight loss 07/18/2019  . Personal history of PE (pulmonary embolism) 04/23/2019  . Headache 02/11/2019  . Iron deficiency anemia 12/21/2018  . Cold sore 10/23/2018  . Blood in stool 09/14/2018  . 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 Therapeutic drug monitoring 07/17/2018  . Chronic diastolic CHF (congestive heart failure) ([place]) 12/19/2017  . Rash 08/11/2017  . Chronic respiratory failure with hypoxia ([place]) 03/30/2017  . Angular cheilitis 03/08/2017  . Leg pain 10/25/2016  . Back pain 08/05/2016  . RA (rheumatoid arthritis) ([place]) 03/31/2015  . Allergic rhinitis   . Varicose vein 09/11/2014  . ILD (interstitial lung disease) ([place])  06/25/2012  . Chest pain 02/27/2012  . CAD (coronary artery disease) 02/27/2012  . Pruritus 08/18/2009  . Postinflammatory pulmonary fibrosis / RA ILD  06/30/2008  . Constipation 01/03/2008  . Dyslipidemia 06/16/2007  . Personal history of DVT (deep vein thrombosis) 06/16/2007    Orientation RESPIRATION BLADDER Height & Weight     Self,Time,Situation,Place  O2 (2L) Incontinent Weight: 102 lb 1.2 oz (46.3 kg) Height:  5' 4.5" (163.8 cm)  BEHAVIORAL SYMPTOMS/MOOD NEUROLOGICAL BOWEL NUTRITION STATUS      Continent Diet  AMBULATORY STATUS COMMUNICATION OF NEEDS Skin   Limited Assist Verbally Surgical wounds                       Personal Care Assistance Level of Assistance  Bathing,Feeding,Dressing Bathing Assistance: Limited assistance Feeding assistance: Independent Dressing Assistance: Limited assistance     Functional Limitations Info  Sight,Hearing,Speech Sight Info: Adequate Hearing Info: Adequate Speech Info: Adequate    SPECIAL CARE FACTORS FREQUENCY  PT (By licensed PT),OT (By licensed OT)     PT Frequency: Minimum 5x a week OT Frequency: Minimum 5x a week            Contractures Contractures Info: Not present    Additional Factors Info  Code Status,Allergies,Isolation Precautions Code Status Info: Full Code Allergies Info: Crestor, Lactose Intolerance, Penicillins   Imuran     Isolation Precautions Info: Due to MRSA     Current Medications (09/02/2020):  This is the current hospital active medication list Current Facility-Administered Medications  Medication Dose Route Frequency Provider Last Rate Last Admin  . (feeding supplement) PROSource Plus liquid 30 mL  30 mL Oral BID BM  Antonieta Pert, MD   30 mL at 09/02/20 0936  . 0.9 %  sodium chloride infusion   Intravenous Continuous Kc, Ramesh, MD 10 mL/hr at 08/27/20 2216 1 mL at 08/27/20 2216  . acetaminophen (TYLENOL) tablet 650 mg  650 mg Oral Q6H Meuth, Brooke A, PA-C   650 mg at 09/02/20 7209  .  bisacodyl (DULCOLAX) suppository 10 mg  10 mg Rectal Daily Saverio Danker, PA-C   10 mg at 09/02/20 4709  . chlorhexidine (PERIDEX) 0.12 % solution 15 mL  15 mL Mouth Rinse BID Kc, Ramesh, MD   15 mL at 09/02/20 0936  . Chlorhexidine Gluconate Cloth 2 % PADS 6 each  6 each Topical Daily Antonieta Pert, MD   6 each at 09/02/20 [phone]  . docusate sodium (COLACE) capsule 100 mg  100 mg Oral BID Meuth, Brooke A, PA-C   100 mg at 09/02/20 6629  . enoxaparin (LOVENOX) injection 30 mg  30 mg Subcutaneous Q24H Lenis Noon, RPH   30 mg at 09/01/20 2136  . famotidine (PEPCID) tablet 20 mg  20 mg Oral Daily Lenis Noon, RPH   20 mg at 09/02/20 4765  . feeding supplement (BOOST / RESOURCE BREEZE) liquid 1 Container  1 Container Oral BID BM Antonieta Pert, MD   1 Container at 09/02/20 [phone]  . fluticasone (FLONASE) 50 MCG/ACT nasal spray 2 spray  2 spray Each Nare Daily PRN Kc, Ramesh, MD      . hydrOXYzine (VISTARIL) injection 25 mg  25 mg Intramuscular Q6H PRN Blount, Xenia T, NP   25 mg at 08/22/20 2325  . lip balm (CARMEX) ointment   Topical PRN Antonieta Pert, MD   1 application at 35/46/56 1546  . MEDLINE mouth rinse  15 mL Mouth Rinse q12n4p Kc, Ramesh, MD   15 mL at 09/01/20 1511  . methocarbamol (ROBAXIN) tablet 500 mg  500 mg Oral Q8H Meuth, Brooke A, PA-C   500 mg at 09/02/20 0531  . morphine 2 MG/ML injection 2 mg  2 mg Intravenous Q4H PRN Meuth, Brooke A, PA-C   2 mg at 09/02/20 0945  . Muscle Rub CREA   Topical PRN Antonieta Pert, MD   Given at 08/18/20 2035  . ondansetron (ZOFRAN) tablet 4 mg  4 mg Oral Q6H PRN Marylyn Ishihara, Tyrone A, DO       Or  . ondansetron (ZOFRAN) injection 4 mg  4 mg Intravenous Q6H PRN Marylyn Ishihara, Tyrone A, DO   4 mg at 08/19/20 2203  . oxyCODONE (Oxy IR/ROXICODONE) immediate release tablet 2.5-5 mg  2.5-5 mg Oral Q4H PRN Meuth, Brooke A, PA-C   5 mg at 09/02/20 0531  . phenol (CHLORASEPTIC) mouth spray 1 spray  1 spray Mouth/Throat PRN Saverio Danker, PA-C      . predniSONE (DELTASONE) tablet 5  mg  5 mg Oral Q breakfast Kc, Ramesh, MD   5 mg at 09/02/20 0936  . promethazine (PHENERGAN) injection 6.25 mg  6.25 mg Intravenous Q8H PRN Kc, Ramesh, MD      . sodium chloride flush (NS) 0.9 % injection 10-40 mL  10-40 mL Intracatheter Q12H Kc, Ramesh, MD   10 mL at 09/02/20 0938  . sodium chloride flush (NS) 0.9 % injection 10-40 mL  10-40 mL Intracatheter PRN Antonieta Pert, MD   10 mL at 09/01/20 1020     Discharge Medications: Please see discharge summary for a list of discharge medications.  Relevant Imaging Results:  Relevant Lab Results:  Additional Information SSN [id number]  Ross Ludwig, LCSW

## 2020-09-02 NOTE — TOC Progression Note (Addendum)
Transition of Care [facility]) - Progression Note    Patient Details  Name: [name] [name] MRN: [id number] Date of Birth: 12-01-1930  Transition of Care [facility] CM/SW Contact  Ross Ludwig, [place] Phone Number: 09/02/2020, 4:08 PM  Clinical Narrative:     Per physician, patient is now off of TPN.  CSW has sent patient's clinicals to different SNFs awaiting for bed offers.  CSW attempted to speak to patient to see if she is interested in SNF, however she did not answer her phone will try to follow up tomorrow.  CSW spoke to patient's nephew, he stated that she will probably not agree to SNF, but he said she would probably like home health.  He also suggested to talk with her to confirm that is what she wants.  CSW to follow up tomorrow.   Expected Discharge Plan: [place] Barriers to Discharge: Continued Medical Work up  Expected Discharge Plan and Services Expected Discharge Plan: [place]   Discharge Planning Services: CM Consult Post Acute Care Choice: [place] arrangements for the past 2 months: [place]: PT,OT [place]: [place] Date [facility] Agency Contacted: 08/21/20 Time [place]: [phone] Representative spoke with at [place]: [place] ([place]) Interventions    Readmission Risk Interventions No flowsheet data found.

## 2020-09-02 NOTE — Progress Notes (Signed)
[facility] Progress Note  10 Days Post-Op  Subjective: CC-  Abdomen sore but overall stable. Denies n/v. Appetite still suppressed but she is tolerating what she is eating. Does not like the food here very much. She drank Boost x1 yesterday. Denies n/v. BM yesterday.  Wound opened and started draining pus last night.  Objective: Vital signs in last 24 hours: Temp:  [98.2 F (36.8 C)-98.6 F (37 C)] 98.2 F (36.8 C) (02/23 0905) Pulse Rate:  [82-96] 82 (02/23 0905) Resp:  [16-18] 18 (02/23 0905) BP: (101-105)/(52-60) 101/56 (02/23 0905) SpO2:  [95 %-100 %] 100 % (02/23 0905) Weight:  [46.3 kg] 46.3 kg (02/23 0500) Last BM Date: 09/01/20  Intake/Output from previous day: 02/22 0701 - 02/23 0700 In: 180 [P.O.:170; I.V.:10] Out: 550 [Emesis/NG output:550] Intake/Output this shift: No intake/output data recorded.  PE: Gen: Alert,NAD Pulm:rate and effort normal Abd: Soft, nondistended, midline incision mostly cdi with staples present - wound opened at distal aspect with purulent drainage, G tube sitewith scant drainage and tube isclamped  Lab Results:  Recent Labs    09/01/20 1019 09/02/20 0412  WBC 18.8* 17.9*  HGB 10.4* 9.9*  HCT 33.1* 31.2*  PLT 299 335   BMET Recent Labs    09/01/20 0400 09/02/20 0412  NA 135 134*  K 4.2 4.1  CL 100 99  CO2 29 26  GLUCOSE 100* 82  BUN 18 18  CREATININE 0.40* 0.53  CALCIUM 8.4* 8.4*   PT/INR No results for input(s): LABPROT, INR in the last 72 hours. CMP     Component Value Date/Time   NA 134 (L) 09/02/2020 0412   NA 138 04/29/2020 0948   K 4.1 09/02/2020 0412   CL 99 09/02/2020 0412   CO2 26 09/02/2020 0412   GLUCOSE 82 09/02/2020 0412   BUN 18 09/02/2020 0412   BUN 11 04/29/2020 0948   CREATININE 0.53 09/02/2020 0412   CREATININE 0.74 11/30/2015 1327   CALCIUM 8.4 (L) 09/02/2020 0412   PROT 5.8 (L) 09/02/2020 0412   PROT 6.5 04/29/2020 0948   ALBUMIN 2.4 (L) 09/02/2020 0412   ALBUMIN 3.8  04/29/2020 0948   AST 101 (H) 09/02/2020 0412   ALT 152 (H) 09/02/2020 0412   ALKPHOS 88 09/02/2020 0412   BILITOT 0.4 09/02/2020 0412   BILITOT 0.7 04/29/2020 0948   GFRNONAA >60 09/02/2020 0412   GFRAA 69 04/29/2020 0948   Lipase     Component Value Date/Time   LIPASE 27 08/17/2020 0410       Studies/Results: No results found.  Anti-infectives: Anti-infectives (From admission, onward)   Start     Dose/Rate Route Frequency Ordered Stop   08/23/20 0930  ceFAZolin (ANCEF) IVPB 2g/100 mL premix  Status:  Discontinued       "And" Linked Group Details   2 g 200 mL/hr over 30 Minutes Intravenous On call to O.R. 08/23/20 3474 08/23/20 0841   08/23/20 0930  metroNIDAZOLE (FLAGYL) IVPB 500 mg       "And" Linked Group Details   500 mg 100 mL/hr over 60 Minutes Intravenous On call to O.R. 08/23/20 0841 08/23/20 1027   08/23/20 0923  ceFAZolin (ANCEF) 2-4 GM/100ML-% IVPB  Status:  Discontinued       Note to Pharmacy: Enrigue Catena   : cabinet override      08/23/20 0923 08/23/20 0938   08/19/20 1000  dapsone tablet 100 mg  Status:  Discontinued       Note to Pharmacy: TAKE  1 TABLET(100 MG) BY MOUTH DAILY     100 mg Oral Daily 08/18/20 1350 08/22/20 0805   08/18/20 1000  cefTRIAXone (ROCEPHIN) 1 g in sodium chloride 0.9 % 100 mL IVPB  Status:  Discontinued        1 g 200 mL/hr over 30 Minutes Intravenous Every 24 hours 08/17/20 1414 08/24/20 1209   08/17/20 0615  cefTRIAXone (ROCEPHIN) 1 g in sodium chloride 0.9 % 100 mL IVPB        1 g 200 mL/hr over 30 Minutes Intravenous  Once 08/17/20 0605 08/17/20 0801       Assessment/Plan Pulmonary fibrosis/interstitial lung disease Rheumatoid arthritis - cellcept/Remicade/predinsone - prednisone 5mg  qd currently Hx CAD/CHF Hx of DVT  Anemia Severeprotein-caloriemalnutrition- prealbumin 6.0 (2/14), up to 21.4 (2/21). Off TPN UTI > 100K Pseudomonas  SBO/Torsion of small bowel mesentary S/pExploratory laparotomy, detorsion of  small bowel, placement of Stamm Gastrostomy tube, 08/23/20, Michaelle Birks - POD#10 - distal aspect of wound opened and pus drained, pack BID - G tube clamped. Keep G-tube dressing clean and dry, keep G-tube perpendicular with the skin to wait erosion of the wound. - having bowel function - Ok for regular diet. Encourage PO intake. Patient is going to ask her nephew who is a Biomedical scientist to bring her some food that she will like - Continue PT/OT, mobilize.Working towards SNF, ok for discharge from surgical standpoint when bed available.  FEN: lampG-tube, reg diet, Boost ID: rocephin 2/7- 2/12 DVT: lovenox Follow up: staple removal ~POD#21, Dr. Zenia Resides Foley:out   LOS: 58 days    Wellington Hampshire, [facility] 09/02/2020, 9:21 AM Please see Amion for pager number during day hours 7:00am-4:30pm

## 2020-09-03 ENCOUNTER — Inpatient Hospital Stay: Admission: RE | Admit: 2020-09-03 | Payer: Medicare Other | Source: Ambulatory Visit

## 2020-09-03 DIAGNOSIS — E43 Unspecified severe protein-calorie malnutrition: Secondary | ICD-10-CM | POA: Diagnosis not present

## 2020-09-03 DIAGNOSIS — J849 Interstitial pulmonary disease, unspecified: Secondary | ICD-10-CM | POA: Diagnosis not present

## 2020-09-03 DIAGNOSIS — K56609 Unspecified intestinal obstruction, unspecified as to partial versus complete obstruction: Secondary | ICD-10-CM | POA: Diagnosis not present

## 2020-09-03 LAB — CBC
HCT: 30.4 % — ABNORMAL LOW (ref 36.0–46.0)
Hemoglobin: 9.7 g/dL — ABNORMAL LOW (ref 12.0–15.0)
MCH: 30.8 pg (ref 26.0–34.0)
MCHC: 31.9 g/dL (ref 30.0–36.0)
MCV: 96.5 fL (ref 80.0–100.0)
Platelets: 360 10*3/uL (ref 150–400)
RBC: 3.15 MIL/uL — ABNORMAL LOW (ref 3.87–5.11)
RDW: 16.3 % — ABNORMAL HIGH (ref 11.5–15.5)
WBC: 16 10*3/uL — ABNORMAL HIGH (ref 4.0–10.5)
nRBC: 0 % (ref 0.0–0.2)

## 2020-09-03 MED ORDER — MORPHINE SULFATE (PF) 2 MG/ML IV SOLN
1.0000 mg | Freq: Four times a day (QID) | INTRAVENOUS | Status: DC | PRN
Start: 2020-09-03 — End: 2020-09-04
  Administered 2020-09-04: 1 mg via INTRAVENOUS
  Filled 2020-09-03: qty 1

## 2020-09-03 NOTE — Progress Notes (Signed)
PROGRESS NOTE    [name] [name]  QMV:[id number] DOB: Jun 01, 1931 DOA: 08/17/2020 PCP: Hoyt Koch, MD   Chief Complaint  Patient presents with  . Abdominal Pain    Brief Narrative:  Prior history of chronic hypoxic respiratory failure on 2 L of nasal cannula secondary to interstitial lung disease/pulmonary fibrosis, rheumatoid arthritis, on chronic prednisone, coronary artery disease, CHF, dyslipidemia, history of DVT presents with nausea vomiting and abdominal pain for 3 days prior to admission.  She was found to have SBO/torsion of the small bowel mesentery and underwent exploratory laparotomy and placement of gastrostomy tube on 08/23/2020.  She was slowly started on diet as her bowel function regained. Patient continues to have purulent discharge at the distal end of the abdominal incision needs up to 3 times dressing changes every day. Patient also reports severe to moderate pain comes in intermittently requiring pain medication but no nausea and vomiting at this time.  Assessment & Plan:   Active Problems:   ILD (interstitial lung disease) (HCC)   SBO (small bowel obstruction) (HCC)   Protein-calorie malnutrition, severe   Acute on chronic respiratory failure with hypoxemia (HCC)   Hypotension after procedure   Hypernatremia  SBO/small bowel mesentery torsion S/p exploratory laparotomy, detorsion of the small bowel, placement of Stamm gastrostomy tube on 08/23/2020 by Dr. Michaelle Birks Bowel function regained and she was started on diet.  Distal aspect of the wound opened up and she has purulent discharge which will need 2-3 times of packing. G-tube is clamped, dressing to keep it dry and clean. Therapy evaluation recommending SNF. CM on board and plan for discharge tomorrow when purulent discharge is improving. Leukocytosis improving.    Acute on chronic respiratory failure, at baseline patient is on 2 L of nasal cannula oxygen. Patient currently at baseline and  doing well.   History of interstitial lung disease/pulmonary fibrosis/rheumatoid arthritis on prednisone, Ofev, CellCept and Remicade outpatient infusion.  Currently holding her ILD meds for now. Patient denies any chest pain or shortness of breath at this time.    Hypophosphatemia and hypomagnesemia Replaced.  Repeat in the morning.   History of coronary artery disease, diastolic heart failure Patient appears euvolemic.  Watch for signs of fluid overload. DC IV fluids.  Patient denies any chest pain or shortness of breath   Postop hypotension in the setting of third space/dehydration resolved. .  [place] blood pressure parameters 90/60. Patient reports feeling weak and tired.   Urinary tract infection Growing about 100,000 Pseudomonas aeruginosa and Enterococcus faecalis Completed the course of antibiotics. ID on board and recommended discontinuing antibiotics at this time.   Elevated liver enzymes Unclear etiology, will get ultrasound of the abdomen to check the liver.    Anemia of blood loss probably from the surgery/normocytic anemia Hemoglobin appears to be around 11 currently is around 9.7.  No signs of bleeding.   DVT prophylaxis: (Lovenox) Code Status: Full code Family Communication: None at bedside Disposition:   Status is: Inpatient  Remains inpatient appropriate because:Ongoing diagnostic testing needed not appropriate for outpatient work up, Unsafe d/c plan and Inpatient level of care appropriate due to severity of illness   Dispo: The patient is from: Home              Anticipated d/c is to: SNF              Anticipated d/c date is: 1 day              Patient  currently is not medically stable to d/c.   Difficult to place patient No       Consultants:   General surgery  Palliative care   Procedures: S/pExploratory laparotomy, detorsion of small bowel, placement of Stamm Gastrostomy tube, 08/23/20, Michaelle Birks  Antimicrobials: Antibiotics Given (last 72 hours)    None       Subjective: Intermittent abdominal pain, no nausea or vomiting, no appetite would like to eat [place] fried chicken and pot pie  Objective: Vitals:   09/03/20 0600 09/03/20 0700 09/03/20 0742 09/03/20 1244  BP:  (!) 99/59  95/60  Pulse:  98  93  Resp:  (!) 24 20 20   Temp:  99 F (37.2 C)  98.6 F (37 C)  TempSrc:  Oral  Oral  SpO2:  94%  94%  Weight: 48.4 kg     Height:        Intake/Output Summary (Last 24 hours) at 09/03/2020 1358 Last data filed at 09/03/2020 1245 Gross per 24 hour  Intake --  Output 600 ml  Net -600 ml   Filed Weights   09/01/20 0607 09/02/20 0500 09/03/20 0600  Weight: 45.4 kg 46.3 kg 48.4 kg    Examination:  General exam: Alert, comfortable not in any kind of distress Respiratory system: Air entry fair, no wheezing or rhonchi Cardiovascular system: S1-S2 heard, regular rate rhythm, no JVD. Gastrointestinal system: Abdomen is soft, wound dehiscence at the distal abdominal incision with purulent discharge. Central nervous system: Alert and grossly nonfocal Extremities: Symmetric 5 x 5 power. Skin: No rashes, lesions or ulcers Psychiatry:  Mood & affect appropriate.     Data Reviewed: I have personally reviewed following labs and imaging studies  CBC: Recent Labs  Lab 08/30/20 0440 08/31/20 0439 09/01/20 1019 09/02/20 0412 09/03/20 0919  WBC 19.9* 17.3* 18.8* 17.9* 16.0*  NEUTROABS  --  11.3*  --   --   --   HGB 10.3* 10.2* 10.4* 9.9* 9.7*  HCT 32.3* 32.1* 33.1* 31.2* 30.4*  MCV 97.3 96.7 97.4 96.9 96.5  PLT 228 257 299 335 161    Basic Metabolic Panel: Recent Labs  Lab 08/28/20 0304 08/29/20 0400 08/30/20 0440 08/31/20 0439 09/01/20 0400 09/02/20 0412  NA 137 136 133* 135 135 134*  K 4.1 4.1 3.9 3.9 4.2 4.1  CL 101 100 97* 99 100 99  CO2 28 29 30 29 29 26   GLUCOSE 109* 99 113* 116* 100* 82  BUN 17 16 17 18 18 18   CREATININE 0.43* 0.36* 0.47  0.42* 0.40* 0.53  CALCIUM 8.2* 8.5* 8.3* 8.4* 8.4* 8.4*  MG 2.0 1.7 1.8 1.6* 1.9  --   PHOS 3.5 2.5 2.9 2.8 3.2  --     GFR: Estimated Creatinine Clearance: 37.9 mL/min (by C-G formula based on SCr of 0.53 mg/dL).  Liver Function Tests: Recent Labs  Lab 08/31/20 0439 09/02/20 0412  AST 89* 101*  ALT 102* 152*  ALKPHOS 75 88  BILITOT 0.6 0.4  PROT 5.6* 5.8*  ALBUMIN 2.2* 2.4*    CBG: Recent Labs  Lab 09/01/20 0010 09/01/20 0627 09/01/20 1206 09/01/20 1811 09/01/20 2323  GLUCAP 108* 101* 113* 138* 82     No results found for this or any previous visit (from the past 240 hour(s)).       Radiology Studies: No results found.      Scheduled Meds: . (feeding supplement) PROSource Plus  30 mL Oral BID BM  . acetaminophen  650 mg Oral Q6H  .  bisacodyl  10 mg Rectal Daily  . chlorhexidine  15 mL Mouth Rinse BID  . Chlorhexidine Gluconate Cloth  6 each Topical Daily  . docusate sodium  100 mg Oral BID  . enoxaparin (LOVENOX) injection  30 mg Subcutaneous Q24H  . famotidine  20 mg Oral Daily  . feeding supplement  1 Container Oral BID BM  . mouth rinse  15 mL Mouth Rinse q12n4p  . methocarbamol  500 mg Oral Q8H  . predniSONE  5 mg Oral Q breakfast  . sodium chloride flush  10-40 mL Intracatheter Q12H   Continuous Infusions: . sodium chloride 1 mL (08/27/20 2216)     LOS: 17 days        Hosie Poisson, MD [facility]   To contact the attending provider between 7A-7P or the covering provider during after hours 7P-7A, please log into the web site [email or web address] and access using universal [place] password for that web site. If you do not have the password, please call the hospital operator.  09/03/2020, 1:58 PM

## 2020-09-03 NOTE — Plan of Care (Signed)

## 2020-09-03 NOTE — Progress Notes (Addendum)
Nutrition Follow-up  DOCUMENTATION CODES:   Severe malnutrition in context of chronic illness,Underweight  INTERVENTION:  - continue Boost Breeze BID and 30 ml Prosource Plus BID. - encourage nephew to bring in favorite foods.   NUTRITION DIAGNOSIS:   Severe Malnutrition related to chronic illness (ILD/pulmonary fibrosis) as evidenced by severe fat depletion,severe muscle depletion. -ongoing  GOAL:   Patient will meet greater than or equal to 90% of their needs -minimally met on average  MONITOR:   PO intake,Supplement acceptance,Labs,Weight trends  ASSESSMENT:   87 year old female with history of chronic respiratory failure on 2 L O2 secondary to ILD/pulmonary fibrosis, RA on chronic prednisone, CAD/CHF, HLD, history of DVT who presented with nausea, vomiting, abdominal pain over the past 3 days. Pt found to have fecal impaction s/p disimpaction x 2 with enema and admitted for SBO.  Diet advanced from CLD to [place] on 2/19 at 0826, to Soft on 2/20 at 0826, and to Regular on 2/23 at 0917. The only documented intakes over the past week were 50% of lunch on 2/19; 70% of lunch on 2/20; 50% of breakfast on 2/21.  Patient seen late morning and had not yet had breakfast. RN was at bedside providing PO pills, Prosource, and Boost Breeze. Review of orders indicates she has been accepting Prosource and Breeze 100% of the time offered.  If she consumes 100% of oral nutrition supplements each day, this provides 700 kcal and 57 grams protein.   TPN was decreased to 30 ml/hr on 2/21 and weaned to off on 2/22.  Surgery's note today indicates patient's nephew is a Biomedical scientist and possibility of him bringing in items for patient. Patient states she is still working on a list for him but that list currently includes chicken salad and chicken pot pie from [facility].   She reports having spaghetti, a baked potato, and baked mac and cheese yesterday and that she was not able to eat much for these due to some  difficulty with chewing them thoroughly and d/t not liking the flavor of these foods.   Weight has been fairly stable over the past 1 week. No information documented in the edema section of flow sheet.     Labs reviewed; Na: 134 mmol/l, Ca: 8.4 mg/dl, LFTs elevated. Medications reviewed; 100 mg colace BID, 20 mg oral pepcid/day, 5 mg oral deltasone/day.     Diet Order:   Diet Order            Diet regular Room service appropriate? Yes; Fluid consistency: Thin  Diet effective now                 EDUCATION NEEDS:   Education needs have been addressed  Skin:  Skin Assessment: Skin Integrity Issues: Skin Integrity Issues:: Incisions Incisions: (closed) abdomen from 2/13  Last BM:  2/22  Height:   Ht Readings from Last 1 Encounters:  08/17/20 5' 4.5" (1.638 m)    Weight:   Wt Readings from Last 1 Encounters:  09/03/20 48.4 kg     Estimated Nutritional Needs:  Kcal:  1500-1700 Protein:  70-85 grams Fluid:  >/= 1.4 L       Jarome Matin, MS, RD, LDN, CNSC Inpatient Clinical Dietitian RD pager # available in [place]  After hours/weekend pager # available in [facility]

## 2020-09-03 NOTE — Progress Notes (Signed)
Progress Note  11 Days Post-Op  Subjective: Patient reports abdominal pain intermittently, none currently. She denies n/v but still doesn't have much appetite. She is hoping someone can bring her some chicken pot pie and chicken salad today. Had a small loose BM yesterday.   Objective: Vital signs in last 24 hours: Temp:  [98.7 F (37.1 C)-99.3 F (37.4 C)] 99 F (37.2 C) (02/24 0700) Pulse Rate:  [91-98] 98 (02/24 0700) Resp:  [20-24] 20 (02/24 0742) BP: (99-107)/(59-76) 99/59 (02/24 0700) SpO2:  [94 %-100 %] 94 % (02/24 0700) Weight:  [48.4 kg] 48.4 kg (02/24 0600) Last BM Date: 09/01/21  Intake/Output from previous day: 02/23 0701 - 02/24 0700 In: -  Out: 350 [Urine:350] Intake/Output this shift: Total I/O In: -  Out: 150 [Urine:150]  PE: Gen: Alert,NAD Pulm:rate and effort normal Abd: Soft, nondistended, midline incision mostly cdi with staples present- wound opened at distal aspect with purulent drainage, G tube sitewith scant drainageandtube isclamped  Lab Results:  Recent Labs    09/02/20 0412 09/03/20 0919  WBC 17.9* 16.0*  HGB 9.9* 9.7*  HCT 31.2* 30.4*  PLT 335 360   BMET Recent Labs    09/01/20 0400 09/02/20 0412  NA 135 134*  K 4.2 4.1  CL 100 99  CO2 29 26  GLUCOSE 100* 82  BUN 18 18  CREATININE 0.40* 0.53  CALCIUM 8.4* 8.4*   PT/INR No results for input(s): LABPROT, INR in the last 72 hours. CMP     Component Value Date/Time   NA 134 (L) 09/02/2020 0412   NA 138 04/29/2020 0948   K 4.1 09/02/2020 0412   CL 99 09/02/2020 0412   CO2 26 09/02/2020 0412   GLUCOSE 82 09/02/2020 0412   BUN 18 09/02/2020 0412   BUN 11 04/29/2020 0948   CREATININE 0.53 09/02/2020 0412   CREATININE 0.74 11/30/2015 1327   CALCIUM 8.4 (L) 09/02/2020 0412   PROT 5.8 (L) 09/02/2020 0412   PROT 6.5 04/29/2020 0948   ALBUMIN 2.4 (L) 09/02/2020 0412   ALBUMIN 3.8 04/29/2020 0948   AST 101 (H) 09/02/2020 0412   ALT 152 (H) 09/02/2020 0412    ALKPHOS 88 09/02/2020 0412   BILITOT 0.4 09/02/2020 0412   BILITOT 0.7 04/29/2020 0948   GFRNONAA >60 09/02/2020 0412   GFRAA 69 04/29/2020 0948   Lipase     Component Value Date/Time   LIPASE 27 08/17/2020 0410       Studies/Results: No results found.  Anti-infectives: Anti-infectives (From admission, onward)   Start     Dose/Rate Route Frequency Ordered Stop   08/23/20 0930  ceFAZolin (ANCEF) IVPB 2g/100 mL premix  Status:  Discontinued       "And" Linked Group Details   2 g 200 mL/hr over 30 Minutes Intravenous On call to O.R. 08/23/20 3818 08/23/20 0841   08/23/20 0930  metroNIDAZOLE (FLAGYL) IVPB 500 mg       "And" Linked Group Details   500 mg 100 mL/hr over 60 Minutes Intravenous On call to O.R. 08/23/20 0841 08/23/20 1027   08/23/20 0923  ceFAZolin (ANCEF) 2-4 GM/100ML-% IVPB  Status:  Discontinued       Note to Pharmacy: Enrigue Catena   : cabinet override      08/23/20 0923 08/23/20 0938   08/19/20 1000  dapsone tablet 100 mg  Status:  Discontinued       Note to Pharmacy: TAKE 1 TABLET(100 MG) BY MOUTH DAILY     100  mg Oral Daily 08/18/20 1350 08/22/20 0805   08/18/20 1000  cefTRIAXone (ROCEPHIN) 1 g in sodium chloride 0.9 % 100 mL IVPB  Status:  Discontinued        1 g 200 mL/hr over 30 Minutes Intravenous Every 24 hours 08/17/20 1414 08/24/20 1209   08/17/20 0615  cefTRIAXone (ROCEPHIN) 1 g in sodium chloride 0.9 % 100 mL IVPB        1 g 200 mL/hr over 30 Minutes Intravenous  Once 08/17/20 0605 08/17/20 0801       Assessment/Plan Pulmonary fibrosis/interstitial lung disease Rheumatoid arthritis - cellcept/Remicade/predinsone - prednisone 5mg  qd currently Hx CAD/CHF Hx of DVT  Anemia Severeprotein-caloriemalnutrition- prealbumin 6.0 (2/14), up to 21.4 (2/21). Off TPN UTI > 100K Pseudomonas  SBO/Torsion of small bowel mesentary S/pExploratory laparotomy, detorsion of small bowel, placement of Stamm Gastrostomy tube, 08/23/20, Michaelle Birks -  POD#11 - distal aspect of wound opened and pus drained, pack BID with DRY GAUZE - G tube clamped. Keep G-tube dressing clean and dry, keep G-tube perpendicular with the skin to wait erosion of the wound. - having bowel function - Ok for regular diet. Encourage PO intake. Ok to have meals brought from outside hospital  - Continue PT/OT, mobilize.Working towards SNF, ok for discharge from surgical standpoint when bed available.  FEN: clampG-tube, reg diet, Boost ID: rocephin 2/7- 2/12 DVT: lovenox Follow up: staple removal ~POD#21, Dr. Zenia Resides Foley:out  LOS: 14 days    Norm Parcel , [facility] Surgery 09/03/2020, 10:38 AM Please see Amion for pager number during day hours 7:00am-4:30pm

## 2020-09-04 ENCOUNTER — Inpatient Hospital Stay (HOSPITAL_COMMUNITY): Payer: Medicare Other

## 2020-09-04 DIAGNOSIS — E43 Unspecified severe protein-calorie malnutrition: Secondary | ICD-10-CM | POA: Diagnosis not present

## 2020-09-04 DIAGNOSIS — J849 Interstitial pulmonary disease, unspecified: Secondary | ICD-10-CM | POA: Diagnosis not present

## 2020-09-04 DIAGNOSIS — K56609 Unspecified intestinal obstruction, unspecified as to partial versus complete obstruction: Secondary | ICD-10-CM | POA: Diagnosis not present

## 2020-09-04 LAB — CBC WITH DIFFERENTIAL/PLATELET
Abs Immature Granulocytes: 0.21 10*3/uL — ABNORMAL HIGH (ref 0.00–0.07)
Basophils Absolute: 0 10*3/uL (ref 0.0–0.1)
Basophils Relative: 0 %
Eosinophils Absolute: 0.1 10*3/uL (ref 0.0–0.5)
Eosinophils Relative: 1 %
HCT: 30.5 % — ABNORMAL LOW (ref 36.0–46.0)
Hemoglobin: 9.7 g/dL — ABNORMAL LOW (ref 12.0–15.0)
Immature Granulocytes: 2 %
Lymphocytes Relative: 16 %
Lymphs Abs: 2.3 10*3/uL (ref 0.7–4.0)
MCH: 30.7 pg (ref 26.0–34.0)
MCHC: 31.8 g/dL (ref 30.0–36.0)
MCV: 96.5 fL (ref 80.0–100.0)
Monocytes Absolute: 0.9 10*3/uL (ref 0.1–1.0)
Monocytes Relative: 7 %
Neutro Abs: 10.7 10*3/uL — ABNORMAL HIGH (ref 1.7–7.7)
Neutrophils Relative %: 74 %
Platelets: 364 10*3/uL (ref 150–400)
RBC: 3.16 MIL/uL — ABNORMAL LOW (ref 3.87–5.11)
RDW: 16 % — ABNORMAL HIGH (ref 11.5–15.5)
WBC: 14.2 10*3/uL — ABNORMAL HIGH (ref 4.0–10.5)
nRBC: 0 % (ref 0.0–0.2)

## 2020-09-04 LAB — PHOSPHORUS: Phosphorus: 2.4 mg/dL — ABNORMAL LOW (ref 2.5–4.6)

## 2020-09-04 LAB — BASIC METABOLIC PANEL
Anion gap: 9 (ref 5–15)
BUN: 14 mg/dL (ref 8–23)
CO2: 26 mmol/L (ref 22–32)
Calcium: 8.3 mg/dL — ABNORMAL LOW (ref 8.9–10.3)
Chloride: 101 mmol/L (ref 98–111)
Creatinine, Ser: 0.54 mg/dL (ref 0.44–1.00)
GFR, Estimated: >60 mL/min (ref >60)
Glucose, Bld: 87 mg/dL (ref 70–99)
Potassium: 3.4 mmol/L — ABNORMAL LOW (ref 3.5–5.1)
Sodium: 136 mmol/L (ref 135–145)

## 2020-09-04 LAB — MAGNESIUM: Magnesium: 1.5 mg/dL — ABNORMAL LOW (ref 1.7–2.4)

## 2020-09-04 MED ORDER — IOHEXOL 300 MG/ML  SOLN
100.0000 mL | Freq: Once | INTRAMUSCULAR | Status: AC | PRN
  Administered 2020-09-04: 80 mL via INTRAVENOUS

## 2020-09-04 MED ORDER — ACETAMINOPHEN 500 MG PO TABS: 1000.0000 mg | ORAL_TABLET | Freq: Three times a day (TID) | ORAL | Status: DC

## 2020-09-04 MED ORDER — MORPHINE SULFATE (PF) 2 MG/ML IV SOLN
1.0000 mg | INTRAVENOUS | Status: DC | PRN
Start: 2020-09-04 — End: 2020-09-08
  Administered 2020-09-06 (×2): 1 mg via INTRAVENOUS
  Filled 2020-09-04 (×2): qty 1

## 2020-09-04 MED ORDER — IOHEXOL 9 MG/ML PO SOLN
ORAL | Status: AC
  Filled 2020-09-04: qty 1000

## 2020-09-04 MED ORDER — MAGNESIUM SULFATE 2 GM/50ML IV SOLN
2.0000 g | Freq: Once | INTRAVENOUS | Status: AC
  Administered 2020-09-04: 2 g via INTRAVENOUS
  Filled 2020-09-04: qty 50

## 2020-09-04 MED ORDER — POTASSIUM PHOSPHATES 15 MMOLE/5ML IV SOLN
30.0000 mmol | Freq: Once | INTRAVENOUS | Status: AC
  Administered 2020-09-04: 30 mmol via INTRAVENOUS
  Filled 2020-09-04: qty 10

## 2020-09-04 MED ORDER — IOHEXOL 9 MG/ML PO SOLN
500.0000 mL | ORAL | Status: AC
  Administered 2020-09-04 (×2): 500 mL via ORAL

## 2020-09-04 MED ORDER — CEFAZOLIN SODIUM-DEXTROSE 1-4 GM/50ML-% IV SOLN
1.0000 g | Freq: Three times a day (TID) | INTRAVENOUS | Status: DC
  Administered 2020-09-04 – 2020-09-07 (×9): 1 g via INTRAVENOUS
  Filled 2020-09-04 (×12): qty 50

## 2020-09-04 NOTE — Progress Notes (Signed)
Progress Note  12 Days Post-Op  Subjective: Complaining of a tooth ache.  Wound packed, drainage slowing.  Having bowel function.  Objective: Vital signs in last 24 hours: Temp:  [98.6 F (37 C)-99.9 F (37.7 C)] 99.9 F (37.7 C) (02/25 0500) Pulse Rate:  [87-93] 89 (02/25 0500) Resp:  [18-20] 18 (02/24 2104) BP: (95-98)/(59-69) 98/69 (02/25 0500) SpO2:  [94 %-96 %] 94 % (02/25 0500) Weight:  [46.4 kg] 46.4 kg (02/25 0500) Last BM Date: 09/01/20  Intake/Output from previous day: 02/24 0701 - 02/25 0700 In: 120 [P.O.:120] Out: 250 [Urine:250] Intake/Output this shift: No intake/output data recorded.  PE: Gen: Alert,NAD Pulm:rate and effort normal Abd: Soft, nondistended, midline incision mostly cdi with staples present- wound opened at distal aspect with decreasing drainage, G tube sitewith scant drainageandtube isclamped  Lab Results:  Recent Labs    09/02/20 0412 09/03/20 0919  WBC 17.9* 16.0*  HGB 9.9* 9.7*  HCT 31.2* 30.4*  PLT 335 360   BMET Recent Labs    09/02/20 0412  NA 134*  K 4.1  CL 99  CO2 26  GLUCOSE 82  BUN 18  CREATININE 0.53  CALCIUM 8.4*   PT/INR No results for input(s): LABPROT, INR in the last 72 hours. CMP     Component Value Date/Time   NA 134 (L) 09/02/2020 0412   NA 138 04/29/2020 0948   K 4.1 09/02/2020 0412   CL 99 09/02/2020 0412   CO2 26 09/02/2020 0412   GLUCOSE 82 09/02/2020 0412   BUN 18 09/02/2020 0412   BUN 11 04/29/2020 0948   CREATININE 0.53 09/02/2020 0412   CREATININE 0.74 11/30/2015 1327   CALCIUM 8.4 (L) 09/02/2020 0412   PROT 5.8 (L) 09/02/2020 0412   PROT 6.5 04/29/2020 0948   ALBUMIN 2.4 (L) 09/02/2020 0412   ALBUMIN 3.8 04/29/2020 0948   AST 101 (H) 09/02/2020 0412   ALT 152 (H) 09/02/2020 0412   ALKPHOS 88 09/02/2020 0412   BILITOT 0.4 09/02/2020 0412   BILITOT 0.7 04/29/2020 0948   GFRNONAA >60 09/02/2020 0412   GFRAA 69 04/29/2020 0948   Lipase     Component Value Date/Time    LIPASE 27 08/17/2020 0410       Studies/Results: US Abdomen Limited  Result Date: 09/04/2020 CLINICAL DATA:  Elevated liver enzymes. EXAM: ULTRASOUND ABDOMEN LIMITED RIGHT UPPER QUADRANT COMPARISON:  CT of the abdomen and pelvis on 08/21/2020 FINDINGS: Gallbladder: Gallbladder is distended. Gallbladder wall is normal in thickness, 2.0 millimeters. There is no sonographic Murphy's sign. No stones or polyps identified. No pericholecystic fluid. Common bile duct: Diameter: 1.8 centimeters, tapering to 7 millimeters proximal to the pancreatic head. No choledocholithiasis identified. Liver: No focal lesion identified. Within normal limits in parenchymal echogenicity. Portal vein is patent on color Doppler imaging with normal direction of blood flow towards the liver. Other: None. IMPRESSION: 1. Distended gallbladder without evidence for acute cholecystitis. 2. Distended common bile duct, tapering distally in the region of the pancreatic head. 3. No choledocholithiasis identified. Electronically Signed   By: Nolon Nations M.D.   On: 09/04/2020 08:49    Anti-infectives: Anti-infectives (From admission, onward)   Start     Dose/Rate Route Frequency Ordered Stop   08/23/20 0930  ceFAZolin (ANCEF) IVPB 2g/100 mL premix  Status:  Discontinued       "And" Linked Group Details   2 g 200 mL/hr over 30 Minutes Intravenous On call to O.R. 08/23/20 2951 08/23/20 0841   08/23/20 0930  metroNIDAZOLE (FLAGYL) IVPB 500 mg       "And" Linked Group Details   500 mg 100 mL/hr over 60 Minutes Intravenous On call to O.R. 08/23/20 0841 08/23/20 1027   08/23/20 0923  ceFAZolin (ANCEF) 2-4 GM/100ML-% IVPB  Status:  Discontinued       Note to Pharmacy: Enrigue Catena   : cabinet override      08/23/20 0923 08/23/20 0938   08/19/20 1000  dapsone tablet 100 mg  Status:  Discontinued       Note to Pharmacy: TAKE 1 TABLET(100 MG) BY MOUTH DAILY     100 mg Oral Daily 08/18/20 1350 08/22/20 0805   08/18/20 1000   cefTRIAXone (ROCEPHIN) 1 g in sodium chloride 0.9 % 100 mL IVPB  Status:  Discontinued        1 g 200 mL/hr over 30 Minutes Intravenous Every 24 hours 08/17/20 1414 08/24/20 1209   08/17/20 0615  cefTRIAXone (ROCEPHIN) 1 g in sodium chloride 0.9 % 100 mL IVPB        1 g 200 mL/hr over 30 Minutes Intravenous  Once 08/17/20 0605 08/17/20 0801       Assessment/Plan Pulmonary fibrosis/interstitial lung disease Rheumatoid arthritis - cellcept/Remicade/predinsone - prednisone 5mg  qd currently Hx CAD/CHF Hx of DVT  Anemia Severeprotein-caloriemalnutrition- prealbumin 6.0 (2/14), up to 21.4 (2/21). Off TPN UTI > 100K Pseudomonas  SBO/Torsion of small bowel mesentary S/pExploratory laparotomy, detorsion of small bowel, placement of Stamm Gastrostomy tube, 08/23/20, Michaelle Birks - distal aspect of wound opened and pus drained, pack BID with DRY GAUZE - G tube clamped. Keep G-tube dressing clean and dry, keep G-tube perpendicular with the skin to wait erosion of the wound. - having bowel function - Ok for regular diet. Encourage PO intake. Ok to have meals brought from outside hospital  - Continue PT/OT, mobilize.  - Persistent leukocytosis may be due to tooth or steriods, but check CT today prior to discharge to ensure no abdominal complication that needs addressed prior to discharge.  If it looks okay, I would discharge the patient today to SNF  FEN: clampG-tube, reg diet, Boost ID: rocephin 2/7- 2/12 DVT: lovenox Follow up: staple removal ~POD#21, Dr. Zenia Resides Foley:out  LOS: 18 days    [name] [name] , [facility] 09/04/2020, 8:58 AM Please see Amion for pager number during day hours 7:00am-4:30pm

## 2020-09-04 NOTE — Progress Notes (Signed)
OT Cancellation Note  Patient Details Name: ORIANNA BISKUP MRN: [id number] DOB: 1931-03-06   Cancelled Treatment:    Reason Eval/Treat Not Completed: Other (comment). Pt with abdominal pain this morning and awaiting CT today to ensure no abdominal complication. Plan to reattempt once CT completed and results are in.  Tyrone Schimke, OT Acute Rehabilitation Services Pager: [phone] Office: [phone]  09/04/2020, 1:06 PM

## 2020-09-04 NOTE — Progress Notes (Signed)
PROGRESS NOTE    [name] [name]  FYB:[id number] DOB: June 01, 1931 DOA: 08/17/2020 PCP: Hoyt Koch, MD   Chief Complaint  Patient presents with  . Abdominal Pain    Brief Narrative:  Prior history of chronic hypoxic respiratory failure on 2 L of nasal cannula secondary to interstitial lung disease/pulmonary fibrosis, rheumatoid arthritis, on chronic prednisone, coronary artery disease, CHF, dyslipidemia, history of DVT presents with nausea vomiting and abdominal pain for 3 days prior to admission.  She was found to have SBO/torsion of the small bowel mesentery and underwent exploratory laparotomy and placement of gastrostomy tube on 08/23/2020.  She was slowly started on diet as her bowel function regained. Patient continues to have purulent discharge at the distal end of the abdominal incision requiring  up to 3 times dressing changes every day. General surgery on board, was found to have a tract under the skin that goes to the top of the incision. She will need frequent dressing changes, and IV ancef started by the surgeon for erythema and cellulitis around the G tube.  CT abdomen and pelvis ordered for further evaluation.  Pt seen and examined at bedside. She reports her abd pain is persistent  . No nausea or vomiting.    Assessment & Plan:   Active Problems:   ILD (interstitial lung disease) (HCC)   SBO (small bowel obstruction) (HCC)   Protein-calorie malnutrition, severe   Acute on chronic respiratory failure with hypoxemia (HCC)   Hypotension after procedure   Hypernatremia  SBO/small bowel mesentery torsion S/p exploratory laparotomy, detorsion of the small bowel, placement of Stamm gastrostomy tube on 08/23/2020 by Dr. Michaelle Birks Bowel function regained and she was started on diet.   But  Distal aspect of the wound opened up and she has purulent discharge which will need 2-3 times of packing. G-tube is clamped, dressing to keep it dry and clean. But she has erythema  and cellulitis at the site of the g tube placement. IV ancef started by gen surgery.  Therapy evaluation recommending SNF. TOC on board and plan for discharge when bed available.  Leukocytosis improving.    Acute on chronic respiratory failure, at baseline patient is on 2 L of nasal cannula oxygen. Patient currently at baseline and doing well. He denies any sob.  CT chest does not shw any edema or consolidation.    History of interstitial lung disease/pulmonary fibrosis/rheumatoid arthritis on prednisone, Ofev, CellCept and Remicade outpatient infusion.  Currently holding her ILD meds for now. Pt on prednisone 5 mg daily.  Patient denies any chest pain or shortness of breath at this time.    Hypophosphatemia and hypomagnesemia Replaced.  Repeat in the morning.   History of coronary artery disease, diastolic heart failure Patient appears euvolemic.   Watch for signs of fluid overload. DC IV fluids.  Patient denies any chest pain or shortness of breath.    Postop hypotension in the setting of third space/dehydration resolved. BP parameters are borderline.   Urinary tract infection Growing about 100,000 Pseudomonas aeruginosa and Enterococcus faecalis Completed the course of antibiotics. ID on board and recommended discontinuing antibiotics at this time.   Elevated liver enzymes Unclear etiology, will get ultrasound of the abdomen to check the liver.    Anemia of blood loss probably from the surgery/normocytic anemia Hemoglobin appears to be around 11 currently is around 9.7.  No signs of bleeding.   Hypokalemia Replaced.    DVT prophylaxis: (Lovenox) Code Status: Full code Family Communication: None  at bedside Disposition:   Status is: Inpatient  Remains inpatient appropriate because:Ongoing diagnostic testing needed not appropriate for outpatient work up, Unsafe d/c plan and Inpatient level of care appropriate due to severity of illness   Dispo: The patient is  from: Home              Anticipated d/c is to: SNF              Anticipated d/c date is: 2 days              Patient currently is not medically stable to d/c.   Difficult to place patient No       Consultants:   General surgery  Palliative care   Procedures: S/pExploratory laparotomy, detorsion of small bowel, placement of Stamm Gastrostomy tube, 08/23/20, Michaelle Birks  Antimicrobials: Antibiotics Given (last 72 hours)    None       Subjective: Persistent abdominal pain. No chest pain or sob.   Objective: Vitals:   09/03/20 1244 09/03/20 2104 09/04/20 0500 09/04/20 1457  BP: 95/60 (!) 95/59 98/69 108/61  Pulse: 93 87 89 92  Resp: 20 18  18   Temp: 98.6 F (37 C) 98.7 F (37.1 C) 99.9 F (37.7 C)   TempSrc: Oral Oral Oral   SpO2: 94% 96% 94% 93%  Weight:   46.4 kg   Height:        Intake/Output Summary (Last 24 hours) at 09/04/2020 1627 Last data filed at 09/04/2020 1454 Gross per 24 hour  Intake 120 ml  Output 125 ml  Net -5 ml   Filed Weights   09/02/20 0500 09/03/20 0600 09/04/20 0500  Weight: 46.3 kg 48.4 kg 46.4 kg    Examination:  General exam: alert and comfortable not in distress.  Respiratory system: clear to auscultation, no wheezing heard.  Cardiovascular system: S1S2, RRR, no jVD,  Gastrointestinal system: abd is soft, tender at the site of abdominal wound, bowel sounds heard.  Central nervous system: alert and oriented,  Extremities: no pedal edema.  Skin: no rashes seen.  Psychiatry: mood is appropriate.      Data Reviewed: I have personally reviewed following labs and imaging studies  CBC: Recent Labs  Lab 08/31/20 0439 09/01/20 1019 09/02/20 0412 09/03/20 0919 09/04/20 1132  WBC 17.3* 18.8* 17.9* 16.0* 14.2*  NEUTROABS 11.3*  --   --   --  10.7*  HGB 10.2* 10.4* 9.9* 9.7* 9.7*  HCT 32.1* 33.1* 31.2* 30.4* 30.5*  MCV 96.7 97.4 96.9 96.5 96.5  PLT 257 299 335 360 740    Basic Metabolic Panel: Recent Labs  Lab  08/29/20 0400 08/30/20 0440 08/31/20 0439 09/01/20 0400 09/02/20 0412 09/04/20 1132  NA 136 133* 135 135 134* 136  K 4.1 3.9 3.9 4.2 4.1 3.4*  CL 100 97* 99 100 99 101  CO2 29 30 29 29 26 26   GLUCOSE 99 113* 116* 100* 82 87  BUN 16 17 18 18 18 14   CREATININE 0.36* 0.47 0.42* 0.40* 0.53 0.54  CALCIUM 8.5* 8.3* 8.4* 8.4* 8.4* 8.3*  MG 1.7 1.8 1.6* 1.9  --  1.5*  PHOS 2.5 2.9 2.8 3.2  --  2.4*    GFR: Estimated Creatinine Clearance: 36.3 mL/min (by C-G formula based on SCr of 0.54 mg/dL).  Liver Function Tests: Recent Labs  Lab 08/31/20 0439 09/02/20 0412  AST 89* 101*  ALT 102* 152*  ALKPHOS 75 88  BILITOT 0.6 0.4  PROT 5.6* 5.8*  ALBUMIN 2.2*  2.4*    CBG: Recent Labs  Lab 09/01/20 0010 09/01/20 0627 09/01/20 1206 09/01/20 1811 09/01/20 2323  GLUCAP 108* 101* 113* 138* 82     No results found for this or any previous visit (from the past 240 hour(s)).       Radiology Studies: CT ABDOMEN PELVIS W CONTRAST  Result Date: 09/04/2020 CLINICAL DATA:  Abdominal trauma. EXAM: CT ABDOMEN AND PELVIS WITH CONTRAST TECHNIQUE: Multidetector CT imaging of the abdomen and pelvis was performed using the standard protocol following bolus administration of intravenous contrast. CONTRAST:  62mL OMNIPAQUE IOHEXOL 300 MG/ML  SOLN COMPARISON:  August 21, 2020. FINDINGS: Lower chest: Stable findings consistent with pulmonary fibrosis. Hepatobiliary: No gallstones are noted. Stable gallbladder distention is noted. Stable intrahepatic and extrahepatic biliary dilatation is noted, possibly due to distal stricture. Pancreas: Unremarkable. No pancreatic ductal dilatation or surrounding inflammatory changes. Spleen: Normal in size without focal abnormality. Adrenals/Urinary Tract: Adrenal glands are unremarkable. Kidneys are normal, without renal calculi, focal lesion, or hydronephrosis. Bladder is unremarkable. Stomach/Bowel: Gastrostomy tube is in good position. There does not appear to  be any evidence of bowel obstruction or inflammation. Status post appendectomy. Sigmoid diverticulosis is noted without inflammation. Vascular/Lymphatic: Aortic atherosclerosis. No enlarged abdominal or pelvic lymph nodes. Reproductive: Status post hysterectomy. No adnexal masses. Other: Midline surgical staples are noted. No hernia or ascites is noted. Musculoskeletal: No fracture is seen. IMPRESSION: 1. Gastrostomy tube is in good position. 2. Sigmoid diverticulosis without inflammation. 3. Stable gallbladder distention is noted. Stable intrahepatic and extrahepatic biliary dilatation is noted, possibly due to distal stricture. 4. Stable findings consistent with pulmonary fibrosis. 5. Aortic atherosclerosis. Aortic Atherosclerosis (ICD10-I70.0). Electronically Signed   By: Marijo Conception M.D.   On: 09/04/2020 13:55   US Abdomen Limited  Result Date: 09/04/2020 CLINICAL DATA:  Elevated liver enzymes. EXAM: ULTRASOUND ABDOMEN LIMITED RIGHT UPPER QUADRANT COMPARISON:  CT of the abdomen and pelvis on 08/21/2020 FINDINGS: Gallbladder: Gallbladder is distended. Gallbladder wall is normal in thickness, 2.0 millimeters. There is no sonographic Murphy's sign. No stones or polyps identified. No pericholecystic fluid. Common bile duct: Diameter: 1.8 centimeters, tapering to 7 millimeters proximal to the pancreatic head. No choledocholithiasis identified. Liver: No focal lesion identified. Within normal limits in parenchymal echogenicity. Portal vein is patent on color Doppler imaging with normal direction of blood flow towards the liver. Other: None. IMPRESSION: 1. Distended gallbladder without evidence for acute cholecystitis. 2. Distended common bile duct, tapering distally in the region of the pancreatic head. 3. No choledocholithiasis identified. Electronically Signed   By: Nolon Nations M.D.   On: 09/04/2020 08:49        Scheduled Meds: . (feeding supplement) PROSource Plus  30 mL Oral BID BM  .  bisacodyl  10 mg Rectal Daily  . chlorhexidine  15 mL Mouth Rinse BID  . Chlorhexidine Gluconate Cloth  6 each Topical Daily  . docusate sodium  100 mg Oral BID  . enoxaparin (LOVENOX) injection  30 mg Subcutaneous Q24H  . famotidine  20 mg Oral Daily  . feeding supplement  1 Container Oral BID BM  . iohexol      . mouth rinse  15 mL Mouth Rinse q12n4p  . methocarbamol  500 mg Oral Q8H  . predniSONE  5 mg Oral Q breakfast  . sodium chloride flush  10-40 mL Intracatheter Q12H   Continuous Infusions: . sodium chloride 1 mL (08/27/20 2216)  .  ceFAZolin (ANCEF) IV  LOS: 18 days        Hosie Poisson, MD [facility]   To contact the attending provider between 7A-7P or the covering provider during after hours 7P-7A, please log into the web site [email or web address] and access using universal [place] password for that web site. If you do not have the password, please call the hospital operator.  09/04/2020, 4:27 PM

## 2020-09-04 NOTE — Progress Notes (Addendum)
12 Days Post-Op    I took all her staples out today, and she has a tract under the skin that goes almost to the top of the incision.  Unfortunately the skin is very much intact and healed, I could not separate it at all without being brutal.  So for not I am going to have the nursing staff pack the wound from the base with an open 4 x 4 to act as wicking, then wet to dry at the base.  She has a lot of erythema/cellulitis with some drainage around the G tube also.   I am going to put her on some Ancef, she has a penicillin allergy and has had Ancef to help with this.   CT scan shows diverticulosis without inflammation, stable gallbladder distension, intra/extrahepatic ductal dilitation.  No acute changes, abdominal US shows:  Distended gallbladder without evidence for acute cholecystitis. Distended common bile duct, tapering distally in the region of the pancreatic head.  No choledocholithiasis identified.  CBD was 1.8 cm tapering to 7 mm.  This had been reviewed by Dr. Thermon Leyland and he agrees.   Anti-infectives (From admission, onward)   Start     Dose/Rate Route Frequency Ordered Stop   09/04/20 1600  ceFAZolin (ANCEF) IVPB 1 g/50 mL premix        1 g 100 mL/hr over 30 Minutes Intravenous Every 8 hours 09/04/20 1434     08/23/20 0930  ceFAZolin (ANCEF) IVPB 2g/100 mL premix  Status:  Discontinued       "And" Linked Group Details   2 g 200 mL/hr over 30 Minutes Intravenous On call to O.R. 08/23/20 3825 08/23/20 0841   08/23/20 0930  metroNIDAZOLE (FLAGYL) IVPB 500 mg       "And" Linked Group Details   500 mg 100 mL/hr over 60 Minutes Intravenous On call to O.R. 08/23/20 0841 08/23/20 1027   08/23/20 0923  ceFAZolin (ANCEF) 2-4 GM/100ML-% IVPB  Status:  Discontinued       Note to Pharmacy: Enrigue Catena   : cabinet override      08/23/20 0923 08/23/20 0938   08/19/20 1000  dapsone tablet 100 mg  Status:  Discontinued       Note to Pharmacy: TAKE 1 TABLET(100 MG) BY MOUTH DAILY     100 mg Oral  Daily 08/18/20 1350 08/22/20 0805   08/18/20 1000  cefTRIAXone (ROCEPHIN) 1 g in sodium chloride 0.9 % 100 mL IVPB  Status:  Discontinued        1 g 200 mL/hr over 30 Minutes Intravenous Every 24 hours 08/17/20 1414 08/24/20 1209   08/17/20 0615  cefTRIAXone (ROCEPHIN) 1 g in sodium chloride 0.9 % 100 mL IVPB        1 g 200 mL/hr over 30 Minutes Intravenous  Once 08/17/20 0605 08/17/20 0801       LOS: 18 days    [name] [name] 09/04/2020 Please see Amion

## 2020-09-04 NOTE — TOC Progression Note (Signed)
Transition of Care [facility]) - Progression Note    Patient Details  Name: [name] [name] MRN: [id number] Date of Birth: [birth year]  Transition of Care [facility]) CM/SW Contact  Ross Ludwig, [place] Phone Number: 09/04/2020, 10:53 PM  Clinical Narrative:     CSW spoke with patient and she is agreeable to going to SNF for short term rehab.  CSW provided bed offers, per patient she wants to discuss options with some friends of hers and will let CSW of bed choice.     Expected Discharge Plan: [place] Barriers to Discharge: Continued Medical Work up  Expected Discharge Plan and Services Expected Discharge Plan: [place]   Discharge Planning Services: CM Consult Post Acute Care Choice: [place] arrangements for the past 2 months: [place]: PT,OT [place]: [place] Date [facility] Contacted: 08/21/20 Time [place]: [phone] Representative spoke with at [place]: [place] ([place]) Interventions    Readmission Risk Interventions No flowsheet data found.

## 2020-09-04 NOTE — Progress Notes (Signed)
CT called to update on pt status as to being unable to complete both bottles of contrast at this time. Stated it would be better if pt could do both. Pt informed and agreed , she will attempt to complete second bottle. CT will be updated on status.

## 2020-09-05 DIAGNOSIS — E43 Unspecified severe protein-calorie malnutrition: Secondary | ICD-10-CM | POA: Diagnosis not present

## 2020-09-05 DIAGNOSIS — K56609 Unspecified intestinal obstruction, unspecified as to partial versus complete obstruction: Secondary | ICD-10-CM | POA: Diagnosis not present

## 2020-09-05 DIAGNOSIS — J849 Interstitial pulmonary disease, unspecified: Secondary | ICD-10-CM | POA: Diagnosis not present

## 2020-09-05 MED ORDER — SODIUM CHLORIDE 0.9 % IV SOLN: INTRAVENOUS | Status: DC

## 2020-09-05 NOTE — Progress Notes (Signed)
[name] [name]  WPY:[id number] DOB: [birth year] DOA: 08/17/2020 PCP: Hoyt Koch, MD   Chief Complaint  Patient presents with  . Abdominal Pain    Brief Narrative:  Prior history of chronic hypoxic respiratory failure on 2 L of nasal cannula secondary to interstitial lung disease/pulmonary fibrosis, rheumatoid arthritis, on chronic prednisone, coronary artery disease, CHF, dyslipidemia, history of DVT presents with nausea vomiting and abdominal pain for 3 days prior to admission.  She was found to have SBO/torsion of the small bowel mesentery and underwent exploratory laparotomy and placement of gastrostomy tube on 08/23/2020.  She was slowly started on diet as her bowel function regained. Patient continues to have purulent discharge at the distal end of the abdominal incision requiring  up to 3 times dressing changes every day. General surgery on board, was found to have a tract under the skin that goes to the top of the incision. She will need frequent dressing changes, and IV ancef started by the surgeon for erythema and cellulitis around the G tube.  CT abdomen and pelvis ordered for further evaluation. CT did not reveal any abscesses.  Pt seen and examiend at bedside, she reports for the first time her abd pain is improving, no nausea or vomiting.    Assessment & Plan:   Active Problems:   ILD (interstitial lung disease) (HCC)   SBO (small bowel obstruction) (HCC)   Protein-calorie malnutrition, severe   Acute on chronic respiratory failure with hypoxemia (HCC)   Hypotension after procedure   Hypernatremia  SBO/small bowel mesentery torsion S/p exploratory laparotomy, detorsion of the small bowel, placement of Stamm gastrostomy tube on 08/23/2020 by Dr. Michaelle Birks Bowel function regained and she was started on diet.   But  Distal aspect of the wound opened up and she has purulent discharge which will need 2-3 times of packing. G-tube is clamped, dressing  to keep it dry and clean. But she has erythema and cellulitis at the site of the g tube placement. IV ancef started by gen surgery.  Continue with IV ancef as she reports improvement in the abdominal pain. No nausea , vomiting. Appetite is poor.  Therapy evaluation recommending SNF. TOC on board and plan for discharge when bed available.  Leukocytosis improving.    Acute on chronic respiratory failure, at baseline patient is on 2 L of nasal cannula oxygen. Patient currently at baseline  and doing well. She denies any sob or chest pain.  CT chest does not show any edema or consolidation.    History of interstitial lung disease/pulmonary fibrosis/rheumatoid arthritis on prednisone, Ofev, CellCept and Remicade outpatient infusion.  Currently holding her ILD meds for now. Pt on prednisone 5 mg daily.  Patient denies any chest pain or shortness of breath at this time.    Hypophosphatemia and hypomagnesemia Replaced. Repeat levels pending.    History of coronary artery disease, diastolic heart failure Patient appears euvolemic.   Watch for signs of fluid overload. Gentle hydration as pt has poor oral intake.  No chest pain or sob.    Postop hypotension in the setting of third space/dehydration resolved. BP parameters are optimal.   Urinary tract infection Growing about 100,000 Pseudomonas aeruginosa and Enterococcus faecalis Completed the course of antibiotics.    Elevated liver enzymes Repeat liver enzymes today. CT abd and pelvis shows intra and extra hepatic biliary dilatation.     Anemia of blood loss probably from the surgery/normocytic anemia Hemoglobin at baseline,  appears  to be around 11 currently is around 9.7.  No signs of bleeding.   Hypokalemia Replaced.    DVT prophylaxis: (Lovenox) Code Status: Full code Family Communication: None at bedside Disposition:   Status is: Inpatient  Remains inpatient appropriate because:Ongoing diagnostic testing needed not  appropriate for outpatient work up, Unsafe d/c plan and Inpatient level of care appropriate due to severity of illness   Dispo: The patient is from: Home              Anticipated d/c is to: SNF              Anticipated d/c date is: 2 days              Patient currently is not medically stable to d/c.   Difficult to place patient No       Consultants:   General surgery  Palliative care   Procedures: S/pExploratory laparotomy, detorsion of small bowel, placement of Stamm Gastrostomy tube, 08/23/20, Michaelle Birks  Antimicrobials: Antibiotics Given (last 72 hours)    Date/Time Action Medication Dose Rate   09/04/20 1812 New Bag/Given   ceFAZolin (ANCEF) IVPB 1 g/50 mL premix 1 g 100 mL/hr   09/05/20 0013 New Bag/Given   ceFAZolin (ANCEF) IVPB 1 g/50 mL premix 1 g 100 mL/hr   09/05/20 3790 New Bag/Given   ceFAZolin (ANCEF) IVPB 1 g/50 mL premix 1 g 100 mL/hr       Subjective: Abdominal pain is improving, no nausea, vomiting. Pt still thinking of the SNF facilities. They have not picked a place yet.   Objective: Vitals:   09/04/20 1457 09/04/20 2015 09/05/20 0548 09/05/20 0635  BP: 108/61 (!) 97/57 97/61   Pulse: 92 89 90   Resp: 18 16    Temp:  98.8 F (37.1 C) 98.5 F (36.9 C)   TempSrc:  Oral Oral   SpO2: 93% 94% 92%   Weight:    42.7 kg  Height:       No intake or output data in the 24 hours ending 09/05/20 1619 Filed Weights   09/03/20 0600 09/04/20 0500 09/05/20 0635  Weight: 48.4 kg 46.4 kg 42.7 kg    Examination:  General exam: alert and comfortable.  Respiratory system: clear to auscultation, no wheezing heard.  Cardiovascular system: S1S2, RRR no JVD,  Gastrointestinal system: abd is soft, mildly tender at the distal end of the incision site. Staples removed.  Non distended.  Central nervous system: alert and oriented, non focal Extremities: no cyanosis Skin: mid abdominal wound with dressing on it.  Psychiatry:  Mood is appropriate.     Data  Reviewed: I have personally reviewed following labs and imaging studies  CBC: Recent Labs  Lab 08/31/20 0439 09/01/20 1019 09/02/20 0412 09/03/20 0919 09/04/20 1132  WBC 17.3* 18.8* 17.9* 16.0* 14.2*  NEUTROABS 11.3*  --   --   --  10.7*  HGB 10.2* 10.4* 9.9* 9.7* 9.7*  HCT 32.1* 33.1* 31.2* 30.4* 30.5*  MCV 96.7 97.4 96.9 96.5 96.5  PLT 257 299 335 360 240    Basic Metabolic Panel: Recent Labs  Lab 08/30/20 0440 08/31/20 0439 09/01/20 0400 09/02/20 0412 09/04/20 1132  NA 133* 135 135 134* 136  K 3.9 3.9 4.2 4.1 3.4*  CL 97* 99 100 99 101  CO2 30 29 29 26 26   GLUCOSE 113* 116* 100* 82 87  BUN 17 18 18 18 14   CREATININE 0.47 0.42* 0.40* 0.53 0.54  CALCIUM 8.3* 8.4* 8.4*  8.4* 8.3*  MG 1.8 1.6* 1.9  --  1.5*  PHOS 2.9 2.8 3.2  --  2.4*    GFR: Estimated Creatinine Clearance: 33.4 mL/min (by C-G formula based on SCr of 0.54 mg/dL).  Liver Function Tests: Recent Labs  Lab 08/31/20 0439 09/02/20 0412  AST 89* 101*  ALT 102* 152*  ALKPHOS 75 88  BILITOT 0.6 0.4  PROT 5.6* 5.8*  ALBUMIN 2.2* 2.4*    CBG: Recent Labs  Lab 09/01/20 0010 09/01/20 0627 09/01/20 1206 09/01/20 1811 09/01/20 2323  GLUCAP 108* 101* 113* 138* 82     No results found for this or any previous visit (from the past 240 hour(s)).       Radiology Studies: CT ABDOMEN PELVIS W CONTRAST  Result Date: 09/04/2020 CLINICAL DATA:  Abdominal trauma. EXAM: CT ABDOMEN AND PELVIS WITH CONTRAST TECHNIQUE: Multidetector CT imaging of the abdomen and pelvis was performed using the standard protocol following bolus administration of intravenous contrast. CONTRAST:  45mL OMNIPAQUE IOHEXOL 300 MG/ML  SOLN COMPARISON:  August 21, 2020. FINDINGS: Lower chest: Stable findings consistent with pulmonary fibrosis. Hepatobiliary: No gallstones are noted. Stable gallbladder distention is noted. Stable intrahepatic and extrahepatic biliary dilatation is noted, possibly due to distal stricture. Pancreas:  Unremarkable. No pancreatic ductal dilatation or surrounding inflammatory changes. Spleen: Normal in size without focal abnormality. Adrenals/Urinary Tract: Adrenal glands are unremarkable. Kidneys are normal, without renal calculi, focal lesion, or hydronephrosis. Bladder is unremarkable. Stomach/Bowel: Gastrostomy tube is in good position. There does not appear to be any evidence of bowel obstruction or inflammation. Status post appendectomy. Sigmoid diverticulosis is noted without inflammation. Vascular/Lymphatic: Aortic atherosclerosis. No enlarged abdominal or pelvic lymph nodes. Reproductive: Status post hysterectomy. No adnexal masses. Other: Midline surgical staples are noted. No hernia or ascites is noted. Musculoskeletal: No fracture is seen. IMPRESSION: 1. Gastrostomy tube is in good position. 2. Sigmoid diverticulosis without inflammation. 3. Stable gallbladder distention is noted. Stable intrahepatic and extrahepatic biliary dilatation is noted, possibly due to distal stricture. 4. Stable findings consistent with pulmonary fibrosis. 5. Aortic atherosclerosis. Aortic Atherosclerosis (ICD10-I70.0). Electronically Signed   By: Marijo Conception M.D.   On: 09/04/2020 13:55   US Abdomen Limited  Result Date: 09/04/2020 CLINICAL DATA:  Elevated liver enzymes. EXAM: ULTRASOUND ABDOMEN LIMITED RIGHT UPPER QUADRANT COMPARISON:  CT of the abdomen and pelvis on 08/21/2020 FINDINGS: Gallbladder: Gallbladder is distended. Gallbladder wall is normal in thickness, 2.0 millimeters. There is no sonographic Murphy's sign. No stones or polyps identified. No pericholecystic fluid. Common bile duct: Diameter: 1.8 centimeters, tapering to 7 millimeters proximal to the pancreatic head. No choledocholithiasis identified. Liver: No focal lesion identified. Within normal limits in parenchymal echogenicity. Portal vein is patent on color Doppler imaging with normal direction of blood flow towards the liver. Other: None.  IMPRESSION: 1. Distended gallbladder without evidence for acute cholecystitis. 2. Distended common bile duct, tapering distally in the region of the pancreatic head. 3. No choledocholithiasis identified. Electronically Signed   By: Nolon Nations M.D.   On: 09/04/2020 08:49        Scheduled Meds: . (feeding supplement) PROSource Plus  30 mL Oral BID BM  . bisacodyl  10 mg Rectal Daily  . chlorhexidine  15 mL Mouth Rinse BID  . Chlorhexidine Gluconate Cloth  6 each Topical Daily  . docusate sodium  100 mg Oral BID  . enoxaparin (LOVENOX) injection  30 mg Subcutaneous Q24H  . famotidine  20 mg Oral Daily  . feeding supplement  1 Container Oral BID BM  . mouth rinse  15 mL Mouth Rinse q12n4p  . methocarbamol  500 mg Oral Q8H  . predniSONE  5 mg Oral Q breakfast  . sodium chloride flush  10-40 mL Intracatheter Q12H   Continuous Infusions: . sodium chloride 1 mL (08/27/20 2216)  . sodium chloride 50 mL/hr at 09/05/20 1612  .  ceFAZolin (ANCEF) IV 1 g (09/05/20 0937)     LOS: 19 days        Hosie Poisson, MD [facility]   To contact the attending provider between 7A-7P or the covering provider during after hours 7P-7A, please log into the web site [email or web address] and access using universal [place] password for that web site. If you do not have the password, please call the hospital operator.  09/05/2020, 4:19 PM

## 2020-09-05 NOTE — Progress Notes (Signed)
Progress Note  13 Days Post-Op  Subjective: Tolerating a diet.  Having bowel function.  Objective: Vital signs in last 24 hours: Temp:  [98.5 F (36.9 C)-98.8 F (37.1 C)] 98.5 F (36.9 C) (02/26 0548) Pulse Rate:  [89-92] 90 (02/26 0548) Resp:  [16-18] 16 (02/25 2015) BP: (97-108)/(57-61) 97/61 (02/26 0548) SpO2:  [92 %-94 %] 92 % (02/26 0548) Weight:  [42.7 kg] 42.7 kg (02/26 0635) Last BM Date: 09/03/20 (very small per pt)  Intake/Output from previous day: 02/25 0701 - 02/26 0700 In: 120 [P.O.:120] Out: 125 [Urine:125] Intake/Output this shift: No intake/output data recorded.  PE: Gen: Alert,NAD Pulm:rate and effort normal Abd: Soft, nondistended, midline incision mostly cdi, bottom portion packed- wound opened, G tube sitewith scant drainageandtube isclamped  Lab Results:  Recent Labs    09/03/20 0919 09/04/20 1132  WBC 16.0* 14.2*  HGB 9.7* 9.7*  HCT 30.4* 30.5*  PLT 360 364   BMET Recent Labs    09/04/20 1132  NA 136  K 3.4*  CL 101  CO2 26  GLUCOSE 87  BUN 14  CREATININE 0.54  CALCIUM 8.3*   PT/INR No results for input(s): LABPROT, INR in the last 72 hours. CMP     Component Value Date/Time   NA 136 09/04/2020 1132   NA 138 04/29/2020 0948   K 3.4 (L) 09/04/2020 1132   CL 101 09/04/2020 1132   CO2 26 09/04/2020 1132   GLUCOSE 87 09/04/2020 1132   BUN 14 09/04/2020 1132   BUN 11 04/29/2020 0948   CREATININE 0.54 09/04/2020 1132   CREATININE 0.74 11/30/2015 1327   CALCIUM 8.3 (L) 09/04/2020 1132   PROT 5.8 (L) 09/02/2020 0412   PROT 6.5 04/29/2020 0948   ALBUMIN 2.4 (L) 09/02/2020 0412   ALBUMIN 3.8 04/29/2020 0948   AST 101 (H) 09/02/2020 0412   ALT 152 (H) 09/02/2020 0412   ALKPHOS 88 09/02/2020 0412   BILITOT 0.4 09/02/2020 0412   BILITOT 0.7 04/29/2020 0948   GFRNONAA >60 09/04/2020 1132   GFRAA 69 04/29/2020 0948   Lipase     Component Value Date/Time   LIPASE 27 08/17/2020 0410       Studies/Results: CT  ABDOMEN PELVIS W CONTRAST  Result Date: 09/04/2020 CLINICAL DATA:  Abdominal trauma. EXAM: CT ABDOMEN AND PELVIS WITH CONTRAST TECHNIQUE: Multidetector CT imaging of the abdomen and pelvis was performed using the standard protocol following bolus administration of intravenous contrast. CONTRAST:  89mL OMNIPAQUE IOHEXOL 300 MG/ML  SOLN COMPARISON:  August 21, 2020. FINDINGS: Lower chest: Stable findings consistent with pulmonary fibrosis. Hepatobiliary: No gallstones are noted. Stable gallbladder distention is noted. Stable intrahepatic and extrahepatic biliary dilatation is noted, possibly due to distal stricture. Pancreas: Unremarkable. No pancreatic ductal dilatation or surrounding inflammatory changes. Spleen: Normal in size without focal abnormality. Adrenals/Urinary Tract: Adrenal glands are unremarkable. Kidneys are normal, without renal calculi, focal lesion, or hydronephrosis. Bladder is unremarkable. Stomach/Bowel: Gastrostomy tube is in good position. There does not appear to be any evidence of bowel obstruction or inflammation. Status post appendectomy. Sigmoid diverticulosis is noted without inflammation. Vascular/Lymphatic: Aortic atherosclerosis. No enlarged abdominal or pelvic lymph nodes. Reproductive: Status post hysterectomy. No adnexal masses. Other: Midline surgical staples are noted. No hernia or ascites is noted. Musculoskeletal: No fracture is seen. IMPRESSION: 1. Gastrostomy tube is in good position. 2. Sigmoid diverticulosis without inflammation. 3. Stable gallbladder distention is noted. Stable intrahepatic and extrahepatic biliary dilatation is noted, possibly due to distal stricture. 4. Stable findings  consistent with pulmonary fibrosis. 5. Aortic atherosclerosis. Aortic Atherosclerosis (ICD10-I70.0). Electronically Signed   By: Marijo Conception M.D.   On: 09/04/2020 13:55   US Abdomen Limited  Result Date: 09/04/2020 CLINICAL DATA:  Elevated liver enzymes. EXAM: ULTRASOUND ABDOMEN  LIMITED RIGHT UPPER QUADRANT COMPARISON:  CT of the abdomen and pelvis on 08/21/2020 FINDINGS: Gallbladder: Gallbladder is distended. Gallbladder wall is normal in thickness, 2.0 millimeters. There is no sonographic Murphy's sign. No stones or polyps identified. No pericholecystic fluid. Common bile duct: Diameter: 1.8 centimeters, tapering to 7 millimeters proximal to the pancreatic head. No choledocholithiasis identified. Liver: No focal lesion identified. Within normal limits in parenchymal echogenicity. Portal vein is patent on color Doppler imaging with normal direction of blood flow towards the liver. Other: None. IMPRESSION: 1. Distended gallbladder without evidence for acute cholecystitis. 2. Distended common bile duct, tapering distally in the region of the pancreatic head. 3. No choledocholithiasis identified. Electronically Signed   By: Nolon Nations M.D.   On: 09/04/2020 08:49    Anti-infectives: Anti-infectives (From admission, onward)   Start     Dose/Rate Route Frequency Ordered Stop   09/04/20 1600  ceFAZolin (ANCEF) IVPB 1 g/50 mL premix        1 g 100 mL/hr over 30 Minutes Intravenous Every 8 hours 09/04/20 1434     08/23/20 0930  ceFAZolin (ANCEF) IVPB 2g/100 mL premix  Status:  Discontinued       "And" Linked Group Details   2 g 200 mL/hr over 30 Minutes Intravenous On call to O.R. 08/23/20 8563 08/23/20 0841   08/23/20 0930  metroNIDAZOLE (FLAGYL) IVPB 500 mg       "And" Linked Group Details   500 mg 100 mL/hr over 60 Minutes Intravenous On call to O.R. 08/23/20 0841 08/23/20 1027   08/23/20 0923  ceFAZolin (ANCEF) 2-4 GM/100ML-% IVPB  Status:  Discontinued       Note to Pharmacy: Enrigue Catena   : cabinet override      08/23/20 0923 08/23/20 0938   08/19/20 1000  dapsone tablet 100 mg  Status:  Discontinued       Note to Pharmacy: TAKE 1 TABLET(100 MG) BY MOUTH DAILY     100 mg Oral Daily 08/18/20 1350 08/22/20 0805   08/18/20 1000  cefTRIAXone (ROCEPHIN) 1 g in sodium  chloride 0.9 % 100 mL IVPB  Status:  Discontinued        1 g 200 mL/hr over 30 Minutes Intravenous Every 24 hours 08/17/20 1414 08/24/20 1209   08/17/20 0615  cefTRIAXone (ROCEPHIN) 1 g in sodium chloride 0.9 % 100 mL IVPB        1 g 200 mL/hr over 30 Minutes Intravenous  Once 08/17/20 0605 08/17/20 0801       Assessment/Plan Pulmonary fibrosis/interstitial lung disease Rheumatoid arthritis - cellcept/Remicade/predinsone - prednisone 5mg  qd currently Hx CAD/CHF Hx of DVT  Anemia Severeprotein-caloriemalnutrition- prealbumin 6.0 (2/14), up to 21.4 (2/21). Off TPN UTI > 100K Pseudomonas  SBO/Torsion of small bowel mesentary S/pExploratory laparotomy, detorsion of small bowel, placement of Stamm Gastrostomy tube, 08/23/20, Michaelle Birks - distal aspect of wound opened and pus drained, pack BID with DRY GAUZE - G tube clamped. Keep G-tube dressing clean and dry, keep G-tube perpendicular with the skin to wait erosion of the wound. - having bowel function - Ok for regular diet. Encourage PO intake. Ok to have meals brought from outside hospital  - Continue PT/OT, mobilize.  - Persistent leukocytosis may be due  to tooth or steriods, CT looks ok.  Ok to discharge the patient to SNF when medically ready  FEN: clampG-tube, reg diet, Boost ID: rocephin 2/7- 2/12 DVT: lovenox Follow up: staples removed, Dr. Zenia Resides Foley:out  LOS: 16 days    Rosario Adie , [place] 09/05/2020, 9:23 AM Please see Amion for pager number during day hours 7:00am-4:30pm

## 2020-09-05 NOTE — Progress Notes (Signed)
Pt transferred to 6th floor. Pt stable at time of DC. Detures, glasses, cell phone, and all personal belongings tx with pt.

## 2020-09-06 DIAGNOSIS — K56609 Unspecified intestinal obstruction, unspecified as to partial versus complete obstruction: Secondary | ICD-10-CM | POA: Diagnosis not present

## 2020-09-06 DIAGNOSIS — E43 Unspecified severe protein-calorie malnutrition: Secondary | ICD-10-CM | POA: Diagnosis not present

## 2020-09-06 DIAGNOSIS — J849 Interstitial pulmonary disease, unspecified: Secondary | ICD-10-CM | POA: Diagnosis not present

## 2020-09-06 DIAGNOSIS — L8990 Pressure ulcer of unspecified site, unspecified stage: Secondary | ICD-10-CM | POA: Insufficient documentation

## 2020-09-06 LAB — CBC
HCT: 29.8 % — ABNORMAL LOW (ref 36.0–46.0)
Hemoglobin: 9.6 g/dL — ABNORMAL LOW (ref 12.0–15.0)
MCH: 31.2 pg (ref 26.0–34.0)
MCHC: 32.2 g/dL (ref 30.0–36.0)
MCV: 96.8 fL (ref 80.0–100.0)
Platelets: 338 10*3/uL (ref 150–400)
RBC: 3.08 MIL/uL — ABNORMAL LOW (ref 3.87–5.11)
RDW: 16.2 % — ABNORMAL HIGH (ref 11.5–15.5)
WBC: 11.6 10*3/uL — ABNORMAL HIGH (ref 4.0–10.5)
nRBC: 0 % (ref 0.0–0.2)

## 2020-09-06 LAB — BASIC METABOLIC PANEL
Anion gap: 8 (ref 5–15)
BUN: 8 mg/dL (ref 8–23)
CO2: 26 mmol/L (ref 22–32)
Calcium: 8.2 mg/dL — ABNORMAL LOW (ref 8.9–10.3)
Chloride: 102 mmol/L (ref 98–111)
Creatinine, Ser: 0.46 mg/dL (ref 0.44–1.00)
GFR, Estimated: >60 mL/min (ref >60)
Glucose, Bld: 88 mg/dL (ref 70–99)
Potassium: 3.6 mmol/L (ref 3.5–5.1)
Sodium: 136 mmol/L (ref 135–145)

## 2020-09-06 LAB — PHOSPHORUS: Phosphorus: 2 mg/dL — ABNORMAL LOW (ref 2.5–4.6)

## 2020-09-06 LAB — HEPATIC FUNCTION PANEL
ALT: 41 U/L (ref 0–44)
AST: 25 U/L (ref 15–41)
Albumin: 2.4 g/dL — ABNORMAL LOW (ref 3.5–5.0)
Alkaline Phosphatase: 80 U/L (ref 38–126)
Bilirubin, Direct: <0.1 mg/dL (ref 0.0–0.2)
Total Bilirubin: 0.5 mg/dL (ref 0.3–1.2)
Total Protein: 5.9 g/dL — ABNORMAL LOW (ref 6.5–8.1)

## 2020-09-06 LAB — MAGNESIUM: Magnesium: 1.8 mg/dL (ref 1.7–2.4)

## 2020-09-06 MED ORDER — POTASSIUM & SODIUM PHOSPHATES 280-160-250 MG PO PACK
1.0000 | PACK | Freq: Three times a day (TID) | ORAL | Status: DC
  Administered 2020-09-06 – 2020-09-08 (×8): 1 via ORAL
  Filled 2020-09-06 (×9): qty 1

## 2020-09-06 NOTE — Progress Notes (Signed)
PROGRESS NOTE    REET SCHARRER  XKG:[id number] DOB: 09/22/1930 DOA: 08/17/2020 PCP: Hoyt Koch, MD   Chief Complaint  Patient presents with  . Abdominal Pain    Brief Narrative:   87 year old lady with Prior history of chronic hypoxic respiratory failure on 2 L of nasal cannula secondary to interstitial lung disease/pulmonary fibrosis, rheumatoid arthritis, on chronic prednisone, coronary artery disease, CHF, dyslipidemia, history of DVT presents with nausea vomiting and abdominal pain for 3 days prior to admission.  She was found to have SBO/torsion of the small bowel mesentery and underwent exploratory laparotomy and placement of gastrostomy tube on 08/23/2020.  She was slowly started on diet as her bowel function regained. Patient continues to have purulent discharge at the distal end of the abdominal incision requiring  up to 3 times dressing changes every day. General surgery on board, was found to have a tract under the skin that goes to the top of the incision. She will need frequent dressing changes, and IV ancef started by the surgeon for erythema and cellulitis around the G tube.  CT abdomen and pelvis ordered for further evaluation. CT did not reveal any abscesses.  She is on day 3 of antibiotics and is looking much better. She reports her appetite is back and improving.  Plan for SNF when bed available. Plan to complete a 7 day of course of antibiotics, can be changed to oral on discharge.    Assessment & Plan:   Active Problems:   ILD (interstitial lung disease) (HCC)   SBO (small bowel obstruction) (HCC)   Protein-calorie malnutrition, severe   Acute on chronic respiratory failure with hypoxemia (HCC)   Hypotension after procedure   Hypernatremia   Pressure injury of skin  SBO/small bowel mesentery torsion S/p exploratory laparotomy, detorsion of the small bowel, placement of Stamm gastrostomy tube on 08/23/2020 by Dr. Michaelle Birks Bowel function regained and  she was started on diet.   But  Distal aspect of the wound opened up and she has purulent discharge which will need 2-3 times of packing. G-tube is clamped, dressing to keep it dry and clean. But she has erythema and cellulitis at the site of the g tube placement. IV ancef started by gen surgery.  Continue with IV ancef , day3/7 of antibiotics.  No nausea,vomiting and abd pain is improving. The purulent discharge is improving.  Therapy evaluation recommending SNF. TOC on board and plan for discharge to SNF when bed available.  Leukocytosis improving.    Acute on chronic respiratory failure, at baseline patient is on 2 L of nasal cannula oxygen. Patient currently at baseline  and doing well. She denies any sob or chest pain.  CT chest does not show any edema or consolidation.    History of interstitial lung disease/pulmonary fibrosis/rheumatoid arthritis on prednisone, Ofev, CellCept and Remicade outpatient infusion.  Currently holding her ILD meds for now. Pt on prednisone 5 mg daily.  Patient denies any chest pain or shortness of breath at this time.    Hypophosphatemia and hypomagnesemia Replaced. Repeat in am.    History of coronary artery disease, diastolic heart failure Patient appears euvolemic.   Watch for signs of fluid overload. Gentle hydration as pt has poor oral intake.  No chest pain or sob.    Postop hypotension in the setting of third space/dehydration resolved. bp parameters this afternoon are borderline, but she is asymptomatic.  Repeat BP parameters later this evening.   Urinary tract infection  Growing about 100,000 Pseudomonas aeruginosa and Enterococcus faecalis Completed the course of antibiotics.    Elevated liver enzymes Resolved. . CT abd and pelvis shows intra and extra hepatic biliary dilatation.     Anemia of blood loss probably from the surgery/normocytic anemia Hemoglobin at baseline,  appears to be around 11 currently is around 9.7.  No signs  of bleeding.   Hypokalemia Replaced.    DVT prophylaxis: (Lovenox) Code Status: Full code Family Communication: None at bedside Disposition:   Status is: Inpatient  Remains inpatient appropriate because:Ongoing diagnostic testing needed not appropriate for outpatient work up, Unsafe d/c plan and Inpatient level of care appropriate due to severity of illness   Dispo: The patient is from: Home              Anticipated d/c is to: SNF              Anticipated d/c date is: 1 day              Patient currently is medically stable to d/c.   Difficult to place patient No       Consultants:   General surgery  Palliative care   Procedures: S/pExploratory laparotomy, detorsion of small bowel, placement of Stamm Gastrostomy tube, 08/23/20, Michaelle Birks  Antimicrobials: Antibiotics Given (last 72 hours)    Date/Time Action Medication Dose Rate   09/04/20 1812 New Bag/Given   ceFAZolin (ANCEF) IVPB 1 g/50 mL premix 1 g 100 mL/hr   09/05/20 0013 New Bag/Given   ceFAZolin (ANCEF) IVPB 1 g/50 mL premix 1 g 100 mL/hr   09/05/20 4098 New Bag/Given   ceFAZolin (ANCEF) IVPB 1 g/50 mL premix 1 g 100 mL/hr   09/05/20 1736 New Bag/Given   ceFAZolin (ANCEF) IVPB 1 g/50 mL premix 1 g 100 mL/hr   09/05/20 2333 New Bag/Given   ceFAZolin (ANCEF) IVPB 1 g/50 mL premix 1 g 100 mL/hr   09/06/20 0738 New Bag/Given   ceFAZolin (ANCEF) IVPB 1 g/50 mL premix 1 g 100 mL/hr   09/06/20 1532 New Bag/Given   ceFAZolin (ANCEF) IVPB 1 g/50 mL premix 1 g 100 mL/hr       Subjective: No nausea, vomiting, abd pain is improving.   Objective: Vitals:   09/06/20 0346 09/06/20 0500 09/06/20 0831 09/06/20 1410  BP: 96/60  (!) 93/57 (!) 84/55  Pulse: 84  86 90  Resp: 16  16 16   Temp: 98.1 F (36.7 C)  98.7 F (37.1 C) 98.9 F (37.2 C)  TempSrc: Oral  Oral Oral  SpO2: 99%  92% 93%  Weight:  42.7 kg    Height:        Intake/Output Summary (Last 24 hours) at 09/06/2020 1820 Last data filed at  09/06/2020 0350 Gross per 24 hour  Intake 590 ml  Output 100 ml  Net 490 ml   Filed Weights   09/05/20 0635 09/05/20 1936 09/06/20 0500  Weight: 42.7 kg 42.1 kg 42.7 kg    Examination:  General exam: alert and comfortable.  Respiratory system: clear to auscultation, no wheezing heard.  Cardiovascular system: S1S2, RRR no JVD,  Gastrointestinal system: abd is soft, mildly tender at the distal end of the incision site. Staples removed.  Non distended.  Central nervous system: alert and oriented, non focal Extremities: no cyanosis Skin: mid abdominal wound with dressing on it.  Psychiatry:  Mood is appropriate.     Data Reviewed: I have personally reviewed following labs and imaging studies  CBC:  Recent Labs  Lab 08/31/20 0439 09/01/20 1019 09/02/20 0412 09/03/20 0919 09/04/20 1132 09/06/20 0604  WBC 17.3* 18.8* 17.9* 16.0* 14.2* 11.6*  NEUTROABS 11.3*  --   --   --  10.7*  --   HGB 10.2* 10.4* 9.9* 9.7* 9.7* 9.6*  HCT 32.1* 33.1* 31.2* 30.4* 30.5* 29.8*  MCV 96.7 97.4 96.9 96.5 96.5 96.8  PLT 257 299 335 360 364 836    Basic Metabolic Panel: Recent Labs  Lab 08/31/20 0439 09/01/20 0400 09/02/20 0412 09/04/20 1132 09/06/20 0604  NA 135 135 134* 136 136  K 3.9 4.2 4.1 3.4* 3.6  CL 99 100 99 101 102  CO2 29 29 26 26 26   GLUCOSE 116* 100* 82 87 88  BUN 18 18 18 14 8   CREATININE 0.42* 0.40* 0.53 0.54 0.46  CALCIUM 8.4* 8.4* 8.4* 8.3* 8.2*  MG 1.6* 1.9  --  1.5* 1.8  PHOS 2.8 3.2  --  2.4* 2.0*    GFR: Estimated Creatinine Clearance: 33.4 mL/min (by C-G formula based on SCr of 0.46 mg/dL).  Liver Function Tests: Recent Labs  Lab 08/31/20 0439 09/02/20 0412 09/06/20 0604  AST 89* 101* 25  ALT 102* 152* 41  ALKPHOS 75 88 80  BILITOT 0.6 0.4 0.5  PROT 5.6* 5.8* 5.9*  ALBUMIN 2.2* 2.4* 2.4*    CBG: Recent Labs  Lab 09/01/20 0010 09/01/20 0627 09/01/20 1206 09/01/20 1811 09/01/20 2323  GLUCAP 108* 101* 113* 138* 82     No results found  for this or any previous visit (from the past 240 hour(s)).       Radiology Studies: No results found.      Scheduled Meds: . (feeding supplement) PROSource Plus  30 mL Oral BID BM  . bisacodyl  10 mg Rectal Daily  . chlorhexidine  15 mL Mouth Rinse BID  . Chlorhexidine Gluconate Cloth  6 each Topical Daily  . docusate sodium  100 mg Oral BID  . enoxaparin (LOVENOX) injection  30 mg Subcutaneous Q24H  . famotidine  20 mg Oral Daily  . feeding supplement  1 Container Oral BID BM  . mouth rinse  15 mL Mouth Rinse q12n4p  . methocarbamol  500 mg Oral Q8H  . potassium & sodium phosphates  1 packet Oral TID WC & HS  . predniSONE  5 mg Oral Q breakfast  . sodium chloride flush  10-40 mL Intracatheter Q12H   Continuous Infusions: . sodium chloride 1 mL (08/27/20 2216)  . sodium chloride 50 mL/hr at 09/06/20 0014  .  ceFAZolin (ANCEF) IV 1 g (09/06/20 1532)     LOS: 20 days        Hosie Poisson, MD [facility]   To contact the attending provider between 7A-7P or the covering provider during after hours 7P-7A, please log into the web site [email or web address] and access using universal [place] password for that web site. If you do not have the password, please call the hospital operator.  09/06/2020, 6:20 PM

## 2020-09-06 NOTE — Progress Notes (Signed)
Occupational Therapy Treatment Patient Details Name: [name] [name] MRN: [id number] DOB: 05-27-1931 Today's Date: 09/06/2020    History of present illness 87 yo female admitted with SBO, UTI,   S/P Exploratory laparotomy, detorsion of small bowel, placement of Stamm Gastrostomy tube, 08/23/20. Hx of falls, pulm fibrosis, chronic respiratory failure-O2 dep 2L, DVT, osteoporosis, RA, DJD, CAD, CHF   OT comments  Treatment focused on toileting and functional mobility in room. Patient min guard to ambulate to bathroom with RW and min assist for toilet transfer. Patient min guard for toileting. APpered to fatigue quickly and reclining back on toilet. Ambulated across room to recliner with verbal cues for hand placement and proper use of rw. Patient found on 2 L [place]. ON RA at rest patinet 93%. With ambulation patient between 89-90%. Left on [place] and RN notified. Patient continues to exhibit generalized weakness and poor activity tolerance. Continue to recommend SNF at discharge. Patient reports she is agreeable.    Follow Up Recommendations  SNF    Equipment Recommendations  3 in 1 bedside commode    Recommendations for Other Services      Precautions / Restrictions Precautions Precautions: Fall Precaution Comments: O2 dep,  gastrostomy tube drain, Required Braces or Orthoses: Other Brace Restrictions Weight Bearing Restrictions: No       Mobility Bed Mobility Overal bed mobility: Needs Assistance Bed Mobility: Supine to Sit     Supine to sit: Supervision;HOB elevated     General bed mobility comments: increased time and use of bed rails to transfer to edge of bed.    Transfers Overall transfer level: Needs assistance Equipment used: Rolling walker (2 wheeled) Transfers: Sit to/from Stand Sit to Stand: Min guard         General transfer comment: Min guard to stand with RW and ambulate to bathroom. Very close stand by for safety. Verbal  cues for hand placement and walker  management.    Balance Overall balance assessment: Needs assistance Sitting-balance support: No upper extremity supported Sitting balance-[name]: Good Sitting balance - Comments: able to sit on toilet and manuever for toileting   Standing balance support: During functional activity;Bilateral upper extremity supported Standing balance-[name]: Fair Standing balance comment: able to take hands off of walker to manage clothing.                           ADL either performed or assessed with clinical judgement   ADL Overall ADL's : Needs assistance/impaired                         Toilet Transfer: Minimal assistance;RW;Ambulation;Regular Toilet;BSC;Grab bars Toilet Transfer Details (indicate cue type and reason): MIn assist to land safely on toilet and to rise - verbal cue for use of gra bar and hand placement on walker. Toileting- Clothing Manipulation and Hygiene: Sit to/from stand;Min guard Toileting - Clothing Manipulation Details (indicate cue type and reason): Patient able to wipe in seated position and manage clothing.     Functional mobility during ADLs: Minimal assistance;Rolling walker       Vision Baseline Vision/History: No visual deficits Patient Visual Report: No change from baseline     Perception     Praxis      Cognition Arousal/Alertness: Awake/alert Behavior During Therapy: WFL for tasks assessed/performed Overall Cognitive Status: Within Functional Limits for tasks assessed  Exercises     Shoulder Instructions       General Comments      Pertinent Vitals/ Pain       Pain Assessment: Faces Faces Pain Scale: Hurts little more Pain Location: abdomen Pain Intervention(s): Limited activity within patient's tolerance;Monitored during session  Home Living                                          Prior Functioning/Environment               Frequency  Min 2X/week        Progress Toward Goals  OT Goals(current goals can now be found in the care plan section)  Progress towards OT goals: Progressing toward goals  Acute Rehab OT Goals Patient Stated Goal: return to independence OT Goal Formulation: With patient Time For Goal Achievement: 09/20/20 Potential to Achieve Goals: Good  Plan Discharge plan needs to be updated    Co-evaluation          OT goals addressed during session: ADL's and self-care (activity tolerance, functional mobility)      AM-PAC OT "6 Clicks" Daily Activity     Outcome Measure   Help from another person eating meals?: None Help from another person taking care of personal grooming?: A Little Help from another person toileting, which includes using toliet, bedpan, or urinal?: A Little Help from another person bathing (including washing, rinsing, drying)?: A Little Help from another person to put on and taking off regular upper body clothing?: A Little Help from another person to put on and taking off regular lower body clothing?: A Little 6 Click Score: 19    End of Session Equipment Utilized During Treatment: Oxygen;Rolling walker  OT Visit Diagnosis: Unsteadiness on feet (R26.81);Pain;Muscle weakness (generalized) (M62.81)   Activity Tolerance Patient tolerated treatment well   Patient Left in chair;with call bell/phone within reach   Nurse Communication Mobility status (o2 sats)        Time: 1010-1039 OT Time Calculation (min): 29 min  Charges: OT General Charges $OT Visit: 1 Visit OT Treatments $Self Care/Home Management : 23-37 mins  [name] [name], OTR/L [place]  Office [phone] Pager: [place] 09/06/2020, 10:59 AM

## 2020-09-07 DIAGNOSIS — J849 Interstitial pulmonary disease, unspecified: Secondary | ICD-10-CM | POA: Diagnosis not present

## 2020-09-07 LAB — CBC
HCT: 30.2 % — ABNORMAL LOW (ref 36.0–46.0)
Hemoglobin: 9.5 g/dL — ABNORMAL LOW (ref 12.0–15.0)
MCH: 30.4 pg (ref 26.0–34.0)
MCHC: 31.5 g/dL (ref 30.0–36.0)
MCV: 96.5 fL (ref 80.0–100.0)
Platelets: 317 10*3/uL (ref 150–400)
RBC: 3.13 MIL/uL — ABNORMAL LOW (ref 3.87–5.11)
RDW: 16.1 % — ABNORMAL HIGH (ref 11.5–15.5)
WBC: 11.9 10*3/uL — ABNORMAL HIGH (ref 4.0–10.5)
nRBC: 0 % (ref 0.0–0.2)

## 2020-09-07 LAB — SARS CORONAVIRUS 2 (TAT 6-24 HRS): SARS Coronavirus 2: NEGATIVE

## 2020-09-07 MED ORDER — DOCUSATE SODIUM 100 MG PO CAPS: 100.0000 mg | ORAL_CAPSULE | Freq: Two times a day (BID) | ORAL | 0 refills | Status: DC

## 2020-09-07 MED ORDER — CEPHALEXIN 500 MG PO CAPS
500.0000 mg | ORAL_CAPSULE | Freq: Three times a day (TID) | ORAL | Status: DC
  Administered 2020-09-08: 500 mg via ORAL
  Filled 2020-09-07: qty 1

## 2020-09-07 MED ORDER — FAMOTIDINE 20 MG PO TABS: 20.0000 mg | ORAL_TABLET | Freq: Every day | ORAL | Status: DC

## 2020-09-07 MED ORDER — CEPHALEXIN 500 MG PO CAPS: 500.0000 mg | ORAL_CAPSULE | Freq: Three times a day (TID) | ORAL | 0 refills | Status: AC

## 2020-09-07 MED ORDER — PROSOURCE PLUS PO LIQD: 30.0000 mL | Freq: Two times a day (BID) | ORAL | Status: DC

## 2020-09-07 NOTE — Progress Notes (Signed)
Physical Therapy Treatment Patient Details Name: [name] [name] MRN: [id number] DOB: 03/15/1931 Today's Date: 09/07/2020    History of Present Illness 87 yo female admitted with SBO, UTI,   S/P Exploratory laparotomy, detorsion of small bowel, placement of Stamm Gastrostomy tube, 08/23/20. Hx of falls, pulm fibrosis, chronic respiratory failure-O2 dep 2L, DVT, osteoporosis, RA, DJD, CAD, CHF    PT Comments    Pt reporting fatigue today, and informs PT "I am leaving today", but agreeable to LE exercises for strengthening and EOB sitting. Pt demonstrating LE weakness and low tolerance for exercise at this time, and occasionally requires physical assist from PT to perform. PT overall requiring min assist for to and from EOB, declines OOB due to fatigue. PT continuing to recommend SNF level of care post-acutely.   Follow Up Recommendations  SNF     Equipment Recommendations  None recommended by PT    Recommendations for Other Services       Precautions / Restrictions Precautions Precautions: Fall Precaution Comments: O2 dep,  gastrostomy tube drain,    Mobility  Bed Mobility Overal bed mobility: Needs Assistance Bed Mobility: Supine to Sit;Sit to Supine     Supine to sit: Min assist Sit to supine: Min assist   General bed mobility comments: min assist for trunk management, boost up in bed upon return to supine.    Transfers                 General transfer comment: declines OOB  Ambulation/Gait             General Gait Details: declines OOB   Stairs             Wheelchair Mobility    Modified Rankin (Stroke Patients Only)       Balance Overall balance assessment: Needs assistance Sitting-balance support: No upper extremity supported Sitting balance-[name]: Good                                      Cognition Arousal/Alertness: Awake/alert Behavior During Therapy: WFL for tasks assessed/performed Overall Cognitive  Status: Within Functional Limits for tasks assessed                                 General Comments: fatigued      Exercises General Exercises - Lower Extremity Ankle Circles/Pumps: AROM;Both;10 reps;Supine Long Arc Quad: AROM;Both;10 reps;Seated Hip ABduction/ADduction: AAROM;Both;10 reps;Supine Straight Leg Raises: AROM;Both;10 reps;Supine    General Comments        Pertinent Vitals/Pain Pain Assessment: Faces Faces Pain Scale: Hurts a little bit Pain Location: abdomen Pain Descriptors / Indicators: Discomfort Pain Intervention(s): Limited activity within patient's tolerance;Monitored during session;Repositioned    Home Living                      Prior Function            PT Goals (current goals can now be found in the care plan section) Acute Rehab PT Goals Patient Stated Goal: return to independence PT Goal Formulation: With patient Time For Goal Achievement: 09/15/20 Potential to Achieve Goals: Fair Progress towards PT goals: Progressing toward goals    Frequency    Min 2X/week      PT Plan Current plan remains appropriate    Co-evaluation  AM-PAC PT "6 Clicks" Mobility   Outcome Measure  Help needed turning from your back to your side while in a flat bed without using bedrails?: A Little Help needed moving from lying on your back to sitting on the side of a flat bed without using bedrails?: A Little Help needed moving to and from a bed to a chair (including a wheelchair)?: A Little Help needed standing up from a chair using your arms (e.g., wheelchair or bedside chair)?: A Little Help needed to walk in hospital room?: A Little Help needed climbing 3-5 steps with a railing? : A Lot 6 Click Score: 17    End of Session Equipment Utilized During Treatment: Oxygen Activity Tolerance: Patient limited by fatigue Patient left: with call bell/phone within reach;in bed;with bed alarm set Nurse Communication:  Mobility status PT Visit Diagnosis: Muscle weakness (generalized) (M62.81);Unsteadiness on feet (R26.81)     Time: 9774-1423 PT Time Calculation (min) (ACUTE ONLY): 18 min  Charges:  $Therapeutic Exercise: 8-22 mins                    Stacie Glaze, PT Acute Rehabilitation Services Pager [phone]  Office [phone]    Roxine Caddy E Ruffin Pyo 09/07/2020, 1:09 PM

## 2020-09-07 NOTE — Consult Note (Signed)
[place] Nurse Consult Note: Patient is discharging today.  Her bed mobility and strength have improved and she is able to move about much better.  Still appears frail and had severe calorie malnutrition upon admission.  G tube in place but tolerating diet now.   Reason for Consult:moisture and pressure breakdown to gluteal folds and sacrum, partial thickness loss.  Wound type:MASD and pressure Pressure Injury POA: NO Measurement:2 cm x 2 cm x 0.1 cm  Wound MMN:OTRR and moist Drainage (amount, consistency, odor) none Periwound:intact  Dressing procedure/placement/frequency:Offload pressure (patient able to manage this now).  Barrier cream twice daily and PRN soilage.  Will not follow at this time.  Please re-consult if needed.  Domenic Moras MSN, RN, FNP-BC CWON Wound, Ostomy, Continence Nurse Pager [phone]

## 2020-09-07 NOTE — TOC Transition Note (Signed)
Transition of Care [facility]) - CM/SW Discharge Note   Patient Details  Name: [name] [name] MRN: [id number] Date of Birth: 1931-04-30  [facility] CM/SW Contact:  Lynnell Catalan, RN Phone Number: 09/07/2020, 3:43 PM   Clinical Narrative:    Followed up with pt on SNF bed choice. She chooses [facility] [place] liaison contacted to accept bed for pt. Covid test ordered this morning. Awaiting Covid test results for admission to SNF. RN will call report to [phone]. [facility] to transport pt to SNF.   Final next level of care: Skilled Nursing Facility Barriers to Discharge: No Barriers Identified   Patient Goals and CMS Choice Patient states their goals for this hospitalization and ongoing recovery are:: To go home CMS Medicare.gov Compare Post Acute Care list provided to:: Patient Choice offered to / list presented to : Patient  Discharge Placement              Patient chooses bed at: [facility] Patient to be transferred to facility by: [place] Name of family member notified: Patient to notify sister    Discharge Plan and Services   Discharge Planning Services: CM Consult Post Acute Care Choice: [place]: PT,OT [facility] Agency: [place] Date [facility] Agency Contacted: 08/21/20 Time [place]: [phone] Representative spoke with at [place]: [place] ([place]) Interventions     Readmission Risk Interventions No flowsheet data found.

## 2020-09-07 NOTE — Discharge Summary (Signed)
Physician Discharge Summary  [name] [name] WUJ:[id number] DOB: [birth year] DOA: 08/17/2020  PCP: Hoyt Koch, MD  Admit date: 08/17/2020 Discharge date: 09/07/2020  Admitted From: SNF Disposition: snf  Recommendations for Outpatient Follow-up:  1. Follow up with PCP in 1-2 weeks 2. Please obtain BMP/CBC in one week 3. Please follow up general surgery as recommended.  4. Please follow up with palliative care as outpatient.     Discharge Condition: Guarded.  CODE STATUS:FULL CODE.  Diet recommendation: Regular diet.   Brief/Interim Summary:  87 year old lady with Prior history of chronic hypoxic respiratory failure on 2 L of nasal cannula secondary to interstitial lung disease/pulmonary fibrosis, rheumatoid arthritis, on chronic prednisone, coronary artery disease, CHF, dyslipidemia, history of DVT presents with nausea vomiting and abdominal pain for 3 days prior to admission.  She was found to have SBO/torsion of the small bowel mesentery and underwent exploratory laparotomy and placement of gastrostomy tube on 08/23/2020.  She was slowly started on diet as her bowel function regained. Patient continues to have purulent discharge at the distal end of the abdominal incision requiring  up to 3 times dressing changes every day. General surgery on board, was found to have a tract under the skin that goes to the top of the incision. She will need frequent dressing changes, and IV ancef started by the surgeon for erythema and cellulitis around the G tube.  CT abdomen and pelvis ordered for further evaluation. CT did not reveal any abscesses.  She is on day 3 of antibiotics and is looking much better. She reports her appetite is back and improving.  Plan for SNF when bed available. Plan to complete a 7 day of course of antibiotics, can be changed to oral on discharge.  Discharge Diagnoses:  Active Problems:   ILD (interstitial lung disease) (HCC)   SBO (small bowel obstruction) (HCC)    Protein-calorie malnutrition, severe   Acute on chronic respiratory failure with hypoxemia (HCC)   Hypotension after procedure   Hypernatremia   Pressure injury of skin  SBO/small bowel mesentery torsion S/p exploratory laparotomy, detorsion of the small bowel, placement of Stamm gastrostomy tube on 08/23/2020 by Dr. Michaelle Birks Bowel function regained and she was started on diet.    Distal aspect of the wound opened up and she has purulent discharge which will need 2-3 times of packing. G-tube is clamped, dressing to keep it dry and clean. But she has erythema and cellulitis at the site of the g tube placement. IV ancef started by gen surgery.she has completed 3 days of ancef, transition to oral antibiotics to complete the course.  No nausea,vomiting and abd pain is improving. The purulent discharge is improving.  Therapy evaluation recommending SNF. TOC on board and plan for discharge to SNF when bed available.  Leukocytosis improving.    Acute on chronic respiratory failure, at baseline patient is on 2 L of nasal cannula oxygen. Patient currently at baseline  and doing well. She denies any sob or chest pain.  CT chest does not show any edema or consolidation.    History of interstitial lung disease/pulmonary fibrosis/rheumatoid arthritis on prednisone, Ofev, CellCept and Remicade outpatient infusion.  Currently holding her ILD meds for now. Pt on prednisone 5 mg daily.  Patient denies any chest pain or shortness of breath at this time.    Hypophosphatemia and hypomagnesemia Replaced.   History of coronary artery disease, diastolic heart failure Patient appears euvolemic.   Watch for signs of fluid  overload.   No chest pain or sob.    Postop hypotension in the setting of third space/dehydration resolved. bp parameters are optimal.   Urinary tract infection Growing about 100,000 Pseudomonas aeruginosa and Enterococcus faecalis Completed the course of  antibiotics.    Elevated liver enzymes Resolved. . CT abd and pelvis shows intra and extra hepatic biliary dilatation.     Anemia of blood loss probably from the surgery/normocytic anemia Hemoglobin at baseline,  appears to be around 11 currently is around 9.7.  No signs of bleeding.   Hypokalemia Replaced.    PRESSURE INJURY. Pressure Injury 09/05/20 Sacrum Mid Stage 2 -  Partial thickness loss of dermis presenting as a shallow open injury with a red, pink wound bed without slough. (Active)  09/05/20 2150  Location: Sacrum  Location Orientation: Mid  Staging: Stage 2 -  Partial thickness loss of dermis presenting as a shallow open injury with a red, pink wound bed without slough.  Wound Description (Comments):   Present on Admission: -- (unsure; patient reports she has had discomfort/sore for awhile. Patient just transferred from another floor.)     Pressure Injury 09/05/20 Sacrum Left Stage 2 -  Partial thickness loss of dermis presenting as a shallow open injury with a red, pink wound bed without slough. (Active)  09/05/20 2150  Location: Sacrum  Location Orientation: Left  Staging: Stage 2 -  Partial thickness loss of dermis presenting as a shallow open injury with a red, pink wound bed without slough.  Wound Description (Comments):   Present on Admission: -- (UTA; patient reports she has had soreness for several days. Just transferred from another floor.)   Wound care will be consulted.      Discharge Instructions  Discharge Instructions    Diet - low sodium heart healthy   Complete by: As directed    Discharge instructions   Complete by: As directed    [place].   Discharge wound care:   Complete by: As directed    Abdominal incision opened distally, pack BID with DRY gauze and apply dry gauze or ABD pad on top and secure with tape     Allergies as of 09/07/2020      Reactions   Crestor [rosuvastatin] Other  (See Comments)   Muscle pain   Lactose Intolerance (gi) Other (See Comments)   Stomach upset   Penicillins Hives   Imuran [azathioprine] Nausea And Vomiting      Medication List    STOP taking these medications   aspirin EC 81 MG tablet   atorvastatin 40 MG tablet Commonly known as: LIPITOR   dapsone 100 MG tablet   metoprolol succinate 25 MG 24 hr tablet Commonly known as: Toprol XL   mirtazapine 15 MG tablet Commonly known as: Remeron   nystatin-triamcinolone cream Commonly known as: MYCOLOG II     TAKE these medications   (feeding supplement) PROSource Plus liquid Take 30 mLs by mouth 2 (two) times daily between meals.   cephALEXin 500 MG capsule Commonly known as: KEFLEX Take 1 capsule (500 mg total) by mouth 3 (three) times daily for 4 days.   docusate sodium 100 MG capsule Commonly known as: COLACE Take 1 capsule (100 mg total) by mouth 2 (two) times daily.   famotidine 20 MG tablet Commonly known as: PEPCID Take 1 tablet (20 mg total) by mouth daily. Start taking on: September 08, 2020   fluticasone 50 MCG/ACT nasal spray Commonly known  as: FLONASE Place 2 sprays into both nostrils daily as needed for allergies or rhinitis.   furosemide 20 MG tablet Commonly known as: LASIX Take 1 tablet by mouth daily as needed for swelling. What changed:   how much to take  how to take this  when to take this  reasons to take this  additional instructions   mycophenolate 500 MG tablet Commonly known as: CellCept 2 tabs bid What changed:   how much to take  how to take this  when to take this  additional instructions   nitroGLYCERIN 0.4 MG SL tablet Commonly known as: NITROSTAT Place 1 tablet (0.4 mg total) under the tongue every 5 (five) minutes as needed for chest pain.   Ofev 100 MG Caps Generic drug: Nintedanib Take 1 capsule (100 mg total) by mouth 2 (two) times daily.   predniSONE 5 MG tablet Commonly known as: DELTASONE TAKE 1 TABLET  DAILY WITH BREAKFAST   REMICADE IV Inject into the vein as directed. Every 2 months            Discharge Care Instructions  (From admission, onward)         Start     Ordered   09/07/20 0000  Discharge wound care:       Comments: Abdominal incision opened distally, pack BID with DRY gauze and apply dry gauze or ABD pad on top and secure with tape   09/07/20 1316          Follow-up Information    [facility], [facility] Follow up.   Specialty: Home Health Services Contact information: [place] [address] [phone]        Dwan Bolt, MD. Go on 09/28/2020.   Specialty: General Surgery Why: Your appointment is 3/21 at 9:30am to follow up from your recent abdominal surgery. Please arrive 15 minutes early to check in and fill out paperwork. Please have family member or staff member with you on return to office.   Contact information: [place]. [address] [place] [address] [phone]              Allergies  Allergen Reactions  . Crestor [Rosuvastatin] Other (See Comments)    Muscle pain  . Lactose Intolerance (Gi) Other (See Comments)    Stomach upset  . Penicillins Hives  . Imuran [Azathioprine] Nausea And Vomiting    Consultations:  General surgery  ID   Procedures/Studies: DG Abd 1 View  Result Date: 08/19/2020 CLINICAL DATA:  Vomiting, abdominal pain. EXAM: ABDOMEN - 1 VIEW COMPARISON:  August 18, 2020. FINDINGS: The bowel gas pattern is normal. No radio-opaque calculi or other significant radiographic abnormality are seen. IMPRESSION: Negative. Electronically Signed   By: Marijo Conception M.D.   On: 08/19/2020 08:13   CT ABDOMEN PELVIS W CONTRAST  Result Date: 09/04/2020 CLINICAL DATA:  Abdominal trauma. EXAM: CT ABDOMEN AND PELVIS WITH CONTRAST TECHNIQUE: Multidetector CT imaging of the abdomen and pelvis was performed using the standard protocol following bolus administration of intravenous  contrast. CONTRAST:  76mL OMNIPAQUE IOHEXOL 300 MG/ML  SOLN COMPARISON:  August 21, 2020. FINDINGS: Lower chest: Stable findings consistent with pulmonary fibrosis. Hepatobiliary: No gallstones are noted. Stable gallbladder distention is noted. Stable intrahepatic and extrahepatic biliary dilatation is noted, possibly due to distal stricture. Pancreas: Unremarkable. No pancreatic ductal dilatation or surrounding inflammatory changes. Spleen: Normal in size without focal abnormality. Adrenals/Urinary Tract: Adrenal glands are unremarkable. Kidneys are normal, without renal  calculi, focal lesion, or hydronephrosis. Bladder is unremarkable. Stomach/Bowel: Gastrostomy tube is in good position. There does not appear to be any evidence of bowel obstruction or inflammation. Status post appendectomy. Sigmoid diverticulosis is noted without inflammation. Vascular/Lymphatic: Aortic atherosclerosis. No enlarged abdominal or pelvic lymph nodes. Reproductive: Status post hysterectomy. No adnexal masses. Other: Midline surgical staples are noted. No hernia or ascites is noted. Musculoskeletal: No fracture is seen. IMPRESSION: 1. Gastrostomy tube is in good position. 2. Sigmoid diverticulosis without inflammation. 3. Stable gallbladder distention is noted. Stable intrahepatic and extrahepatic biliary dilatation is noted, possibly due to distal stricture. 4. Stable findings consistent with pulmonary fibrosis. 5. Aortic atherosclerosis. Aortic Atherosclerosis (ICD10-I70.0). Electronically Signed   By: Marijo Conception M.D.   On: 09/04/2020 13:55   CT ABDOMEN PELVIS W CONTRAST  Result Date: 08/21/2020 CLINICAL DATA:  Follow-up small bowel obstruction. EXAM: CT ABDOMEN AND PELVIS WITH CONTRAST TECHNIQUE: Multidetector CT imaging of the abdomen and pelvis was performed using the standard protocol following bolus administration of intravenous contrast. CONTRAST:  54mL OMNIPAQUE IOHEXOL 300 MG/ML  SOLN COMPARISON:  08/17/2020 CT  scan. FINDINGS: Lower chest: Severe chronic pulmonary fibrosis again demonstrated. The heart is within normal limits in size. Stable aortic and coronary artery calcifications. Hepatobiliary: No hepatic lesions are identified. Persistent central intrahepatic biliary dilatation significant common bile duct dilatation measuring up to 15 mm. Gallbladder is mildly distended. No obvious gallstones. Pancreas: Moderate pancreatic atrophy but no mass, inflammation or ductal dilatation. Spleen: Normal size.  No focal lesions. Adrenals/Urinary Tract: Adrenal glands and kidneys are unremarkable. The bladder is unremarkable. Stomach/Bowel: Progressive small-bowel obstruction with marked distention of the stomach duodenum and entire small bowel down into the pelvis. There appears to be a transition point associated with an intraluminal lesion. This lesion contains a small amount of gas centrally and is most likely a large gallstone. I believe was present on the prior study also beyond this lesions the distal ileum is decompressed/normal in caliber all the way to the terminal ileum. The colon is decompressed. Vascular/Lymphatic: Stable advanced atherosclerotic calcifications involving the aorta and branch vessels but no aneurysm or dissection. No mesenteric or retroperitoneal mass or adenopathy. Reproductive: Surgically absent. Other: No free air or significant free fluid collections. Musculoskeletal: No significant bony findings. Stable degenerative changes involving the lumbar spine. IMPRESSION: 1. Progressive small-bowel obstruction with significant gastric and small bowel distension. There is a transition point associated with an intraluminal lesion in the mid distal ileum in the mid pelvis. This is most likely a large gallstone (gallstone ileus). 2. Persistent intra and extrahepatic biliary dilatation without obvious cause. A distal stricture is possible. 3. Stable advanced atherosclerotic calcifications involving the aorta  and branch vessels. 4. Severe chronic pulmonary fibrosis. 5. Aortic atherosclerosis. Aortic Atherosclerosis (ICD10-I70.0). Electronically Signed   By: Marijo Sanes M.D.   On: 08/21/2020 20:00   CT ABDOMEN PELVIS W CONTRAST  Result Date: 08/17/2020 CLINICAL DATA:  Acute generalized abdominal pain. EXAM: CT ABDOMEN AND PELVIS WITH CONTRAST TECHNIQUE: Multidetector CT imaging of the abdomen and pelvis was performed using the standard protocol following bolus administration of intravenous contrast. CONTRAST:  119mL OMNIPAQUE IOHEXOL 300 MG/ML  SOLN COMPARISON:  September 28, 2015. FINDINGS: Lower chest: Stable chronic cystic changes seen involving the visualized lung bases as noted on prior exam. Hepatobiliary: No gallstones are noted. Stable intrahepatic and extrahepatic biliary dilatation is noted. Liver is unremarkable. Pancreas: Unremarkable. No pancreatic ductal dilatation or surrounding inflammatory changes. Spleen: Normal in size without focal abnormality.  Adrenals/Urinary Tract: Adrenal glands are unremarkable. Kidneys are normal, without renal calculi, focal lesion, or hydronephrosis. Bladder is unremarkable. Stomach/Bowel: The stomach appears normal. Large amount of stool seen in the rectum concerning for impaction. Sigmoid diverticulosis is noted without inflammation. Dilated small bowel loops are noted in the pelvis with probable transition zone seen in the pelvis, concerning for distal small bowel obstruction or possibly focal ileus or enteritis. Vascular/Lymphatic: Aortic atherosclerosis. No enlarged abdominal or pelvic lymph nodes. Reproductive: Status post hysterectomy. No adnexal masses. Other: No abdominal wall hernia or abnormality. No abdominopelvic ascites. Musculoskeletal: No acute or significant osseous findings. IMPRESSION: 1. Dilated small bowel loops are noted in the pelvis with probable transition zone seen in the pelvis, concerning for distal small bowel obstruction or possibly focal ileus or  enteritis. 2. Large amount of stool seen in the rectum concerning for impaction. 3. Sigmoid diverticulosis without inflammation. 4. Stable intrahepatic and extrahepatic biliary dilatation is noted. 5. Aortic atherosclerosis. Aortic Atherosclerosis (ICD10-I70.0). Electronically Signed   By: Marijo Conception M.D.   On: 08/17/2020 08:29   US Abdomen Limited  Result Date: 09/04/2020 CLINICAL DATA:  Elevated liver enzymes. EXAM: ULTRASOUND ABDOMEN LIMITED RIGHT UPPER QUADRANT COMPARISON:  CT of the abdomen and pelvis on 08/21/2020 FINDINGS: Gallbladder: Gallbladder is distended. Gallbladder wall is normal in thickness, 2.0 millimeters. There is no sonographic Murphy's sign. No stones or polyps identified. No pericholecystic fluid. Common bile duct: Diameter: 1.8 centimeters, tapering to 7 millimeters proximal to the pancreatic head. No choledocholithiasis identified. Liver: No focal lesion identified. Within normal limits in parenchymal echogenicity. Portal vein is patent on color Doppler imaging with normal direction of blood flow towards the liver. Other: None. IMPRESSION: 1. Distended gallbladder without evidence for acute cholecystitis. 2. Distended common bile duct, tapering distally in the region of the pancreatic head. 3. No choledocholithiasis identified. Electronically Signed   By: Nolon Nations M.D.   On: 09/04/2020 08:49   DG Chest Port 1 View  Result Date: 08/24/2020 CLINICAL DATA:  Acute respiratory failure with hypoxemia EXAM: PORTABLE CHEST 1 VIEW COMPARISON:  02/12/2020 FINDINGS: Pulmonary fibrosis by chest CT. The pattern is similar to prior no convincing change in density. Right PICC with tip at the upper right atrium. No effusion or pneumothorax. Stable borderline heart size IMPRESSION: Pulmonary fibrosis with similar pattern to 2021. No acute superimposed disease is seen, but early pneumonia could easily be obscured given the extent chronic changes. Electronically Signed   By: Monte Fantasia M.D.   On: 08/24/2020 06:10   DG Abd Portable 1V  Result Date: 08/18/2020 CLINICAL DATA:  Small bowel obstruction. EXAM: PORTABLE ABDOMEN - 1 VIEW COMPARISON:  CT scan 08/17/2020 FINDINGS: Supine abdomen shows no substantial change in bowel gas pattern since scout image of yesterday's CT scan. Much of the small bowel on the prior CT was fluid-filled making it in apparent on x-ray today. There is a gas filled loop of small bowel in the left abdomen today measuring approximately 2.5 cm in diameter. Chronic interstitial changes noted in the lung bases. Bones are diffusely demineralized. IMPRESSION: No substantial change in bowel gas pattern since scout image of yesterday's CT scan with an air-filled small bowel loop in the left abdomen now measuring 2.5 cm in diameter. Electronically Signed   By: Misty Stanley M.D.   On: 08/18/2020 07:45   DG [place] Dys Tube Plc W/Fl W/Rad  Result Date: 08/22/2020 CLINICAL DATA:  Inpatient. Small bowel obstruction. Nasogastric feeding tube placement requested under fluoroscopic  guidance. EXAM: NASO G TUBE PLACEMENT WITH FL AND WITH RAD CONTRAST:  None. FLUOROSCOPY TIME:  Fluoroscopy Time:  1 minutes 6 seconds Radiation Exposure Index (if provided by the fluoroscopic device): 3.8 mGy Number of Acquired Spot Images: 0 COMPARISON:  08/21/2020 CT abdomen/pelvis. 01/24/2020 chest CT angiogram. FINDINGS: Nasogastric tube placement was attempted under fluoroscopic guidance, however was unsuccessful due to inability to pass the tube beyond the posterior nasopharynx. IMPRESSION: Technically unsuccessful attempted nasogastric tube placement under fluoroscopic guidance due to obstruction at the level of the posterior nasopharynx. Electronically Signed   By: Ilona Sorrel M.D.   On: 08/22/2020 14:25   DG ATTEMPTED STUDY-NO REPORT  Result Date: 08/22/2020 There is no Radiologist interpretation  for this exam.  [place] EKG SITE RITE  Result Date: 08/21/2020 If Site Rite image not  attached, placement could not be confirmed due to current cardiac rhythm.      Subjective: No new complaints.   Discharge Exam: Vitals:   09/06/20 2132 09/07/20 0650  BP: (!) 104/53 (!) 94/57  Pulse: 85 88  Resp: 14 16  Temp: 100 F (37.8 C) 99.5 F (37.5 C)  SpO2: 98% 99%   Vitals:   09/06/20 0831 09/06/20 1410 09/06/20 2132 09/07/20 0650  BP: (!) 93/57 (!) 84/55 (!) 104/53 (!) 94/57  Pulse: 86 90 85 88  Resp: 16 16 14 16   Temp: 98.7 F (37.1 C) 98.9 F (37.2 C) 100 F (37.8 C) 99.5 F (37.5 C)  TempSrc: Oral Oral Oral Oral  SpO2: 92% 93% 98% 99%  Weight:    44.5 kg  Height:        General: Pt is alert, awake, not in acute distress Cardiovascular: RRR, S1/S2 +, no rubs, no gallops Respiratory: CTA bilaterally, no wheezing, no rhonchi Abdominal: Soft, NT, ND, bowel sounds + Extremities: no edema, no cyanosis    The results of significant diagnostics from this hospitalization (including imaging, microbiology, ancillary and laboratory) are listed below for reference.     Microbiology: No results found for this or any previous visit (from the past 240 hour(s)).   Labs: BNP (last 3 results) Recent Labs    02/12/20 1944  BNP 97.9   Basic Metabolic Panel: Recent Labs  Lab 09/01/20 0400 09/02/20 0412 09/04/20 1132 09/06/20 0604  NA 135 134* 136 136  K 4.2 4.1 3.4* 3.6  CL 100 99 101 102  CO2 29 26 26 26   GLUCOSE 100* 82 87 88  BUN 18 18 14 8   CREATININE 0.40* 0.53 0.54 0.46  CALCIUM 8.4* 8.4* 8.3* 8.2*  MG 1.9  --  1.5* 1.8  PHOS 3.2  --  2.4* 2.0*   Liver Function Tests: Recent Labs  Lab 09/02/20 0412 09/06/20 0604  AST 101* 25  ALT 152* 41  ALKPHOS 88 80  BILITOT 0.4 0.5  PROT 5.8* 5.9*  ALBUMIN 2.4* 2.4*   No results for input(s): LIPASE, AMYLASE in the last 168 hours. No results for input(s): AMMONIA in the last 168 hours. CBC: Recent Labs  Lab 09/02/20 0412 09/03/20 0919 09/04/20 1132 09/06/20 0604 09/07/20 0614  WBC 17.9*  16.0* 14.2* 11.6* 11.9*  NEUTROABS  --   --  10.7*  --   --   HGB 9.9* 9.7* 9.7* 9.6* 9.5*  HCT 31.2* 30.4* 30.5* 29.8* 30.2*  MCV 96.9 96.5 96.5 96.8 96.5  PLT 335 360 364 338 317   Cardiac Enzymes: No results for input(s): CKTOTAL, CKMB, CKMBINDEX, TROPONINI in the last 168 hours. BNP:  Invalid input(s): POCBNP CBG: Recent Labs  Lab 09/01/20 0010 09/01/20 0627 09/01/20 1206 09/01/20 1811 09/01/20 2323  GLUCAP 108* 101* 113* 138* 82   D-Dimer No results for input(s): DDIMER in the last 72 hours. Hgb A1c No results for input(s): HGBA1C in the last 72 hours. Lipid Profile No results for input(s): CHOL, HDL, LDLCALC, TRIG, CHOLHDL, LDLDIRECT in the last 72 hours. Thyroid function studies No results for input(s): TSH, T4TOTAL, T3FREE, THYROIDAB in the last 72 hours.  Invalid input(s): FREET3 Anemia work up No results for input(s): VITAMINB12, FOLATE, FERRITIN, TIBC, IRON, RETICCTPCT in the last 72 hours. Urinalysis    Component Value Date/Time   COLORURINE AMBER (A) 08/21/2020 1740   APPEARANCEUR CLOUDY (A) 08/21/2020 1740   LABSPEC 1.036 (H) 08/21/2020 1740   PHURINE 6.0 08/21/2020 1740   GLUCOSEU NEGATIVE 08/21/2020 1740   GLUCOSEU NEGATIVE 01/03/2020 1434   HGBUR MODERATE (A) 08/21/2020 1740   BILIRUBINUR NEGATIVE 08/21/2020 1740   BILIRUBINUR N 01/24/2014 1354   KETONESUR 80 (A) 08/21/2020 1740   PROTEINUR 100 (A) 08/21/2020 1740   UROBILINOGEN 4.0 (A) 01/03/2020 1434   NITRITE NEGATIVE 08/21/2020 1740   LEUKOCYTESUR TRACE (A) 08/21/2020 1740   Sepsis Labs Invalid input(s): PROCALCITONIN,  WBC,  LACTICIDVEN Microbiology No results found for this or any previous visit (from the past 240 hour(s)).   Time coordinating discharge: 33 minutes.   SIGNED:   Hosie Poisson, MD  [facility] 09/07/2020, 1:18 PM

## 2020-09-07 NOTE — Progress Notes (Addendum)
15 Days Post-Op      Subjective: Frail appearing, with wick in place.  Midline wound is OK, drainage is less, the gastrostomy tube site is a little better, still has cellulitis around the tube, with some purulent drainage, I changed this AM, and staff changed last PM.  She says she is eating better.  Objective: Vital signs in last 24 hours: Temp:  [98.7 F (37.1 C)-100 F (37.8 C)] 99.5 F (37.5 C) (02/28 0650) Pulse Rate:  [85-90] 88 (02/28 0650) Resp:  [14-16] 16 (02/28 0650) BP: (84-104)/(53-57) 94/57 (02/28 0650) SpO2:  [92 %-99 %] 99 % (02/28 0650) Weight:  [44.5 kg] 44.5 kg (02/28 0650) Last BM Date: 09/05/20 320 PO recorded 50 IV recorded Voided x 1 recorded Drain 0 TM 100, SBP 84-104  WBC 11.9   Intake/Output from previous day: 02/27 0701 - 02/28 0700 In: 720 [P.O.:320; I.V.:350; IV Piggyback:50] Out: 0  Intake/Output this shift: No intake/output data recorded.  General appearance: alert, cooperative, no distress and frail/deconditioned, but in no distress. GI: midline with less drainage, packing in place, tolerating diet, and reports eating better.  Gastrostomy tube site a little better than 2/25  Lab Results:  Recent Labs    09/06/20 0604 09/07/20 0614  WBC 11.6* 11.9*  HGB 9.6* 9.5*  HCT 29.8* 30.2*  PLT 338 317    BMET Recent Labs    09/04/20 1132 09/06/20 0604  NA 136 136  K 3.4* 3.6  CL 101 102  CO2 26 26  GLUCOSE 87 88  BUN 14 8  CREATININE 0.54 0.46  CALCIUM 8.3* 8.2*   PT/INR No results for input(s): LABPROT, INR in the last 72 hours.  Recent Labs  Lab 09/02/20 0412 09/06/20 0604  AST 101* 25  ALT 152* 41  ALKPHOS 88 80  BILITOT 0.4 0.5  PROT 5.8* 5.9*  ALBUMIN 2.4* 2.4*     Lipase     Component Value Date/Time   LIPASE 27 08/17/2020 0410     Medications: . (feeding supplement) PROSource Plus  30 mL Oral BID BM  . bisacodyl  10 mg Rectal Daily  . chlorhexidine  15 mL Mouth Rinse BID  . Chlorhexidine Gluconate  Cloth  6 each Topical Daily  . docusate sodium  100 mg Oral BID  . enoxaparin (LOVENOX) injection  30 mg Subcutaneous Q24H  . famotidine  20 mg Oral Daily  . feeding supplement  1 Container Oral BID BM  . mouth rinse  15 mL Mouth Rinse q12n4p  . methocarbamol  500 mg Oral Q8H  . potassium & sodium phosphates  1 packet Oral TID WC & HS  . predniSONE  5 mg Oral Q breakfast  . sodium chloride flush  10-40 mL Intracatheter Q12H    Assessment/Plan Pulmonary fibrosis/interstitial lung disease Rheumatoid arthritis - cellcept/Remicade/predinsone - prednisone 5mg  qd currently Hx CAD/CHF Hx of DVT  Anemia Severeprotein-caloriemalnutrition- prealbumin 6.0 (2/14), up to 21.4 (2/21). Off TPN UTI > 100K Pseudomonas  SBO/Torsion of small bowel mesentary S/pExploratory laparotomy, detorsion of small bowel, placement of Stamm Gastrostomy tube, 08/23/20, Michaelle Birks - distal aspect of wound opened and pus drained, pack BID with DRY GAUZE - G tube clamped.Keep G-tube dressing clean and dry, keep G-tube perpendicular with the skin to wait erosion of the wound. - having bowel function - Ok for regular diet. Encourage PO intake. Ok to have meals brought from outside hospital  - Continue PT/OT, mobilize.  - Persistent leukocytosis may be due to tooth or  steriods, CT looks ok.  Ok to discharge the patient to SNF when medically ready  FEN: clampG-tube,regdiet, Boost ID: rocephin 2/7- 2/12 IYM:[id number] Follow up: staples removed, Dr. Zenia Resides Foley:out  Plan:  Continue local wound care, discuss G tube with Dr. Brantley Stage       LOS: 21 days    Jerret Mcbane 09/07/2020 Please see Amion

## 2020-09-08 ENCOUNTER — Other Ambulatory Visit: Payer: Self-pay | Admitting: *Deleted

## 2020-09-08 DIAGNOSIS — M069 Rheumatoid arthritis, unspecified: Secondary | ICD-10-CM | POA: Diagnosis not present

## 2020-09-08 DIAGNOSIS — T8131XS Disruption of external operation (surgical) wound, not elsewhere classified, sequela: Secondary | ICD-10-CM | POA: Diagnosis not present

## 2020-09-08 DIAGNOSIS — E782 Mixed hyperlipidemia: Secondary | ICD-10-CM | POA: Diagnosis not present

## 2020-09-08 DIAGNOSIS — Z86711 Personal history of pulmonary embolism: Secondary | ICD-10-CM | POA: Diagnosis not present

## 2020-09-08 DIAGNOSIS — T8189XA Other complications of procedures, not elsewhere classified, initial encounter: Secondary | ICD-10-CM | POA: Diagnosis not present

## 2020-09-08 DIAGNOSIS — J8410 Pulmonary fibrosis, unspecified: Secondary | ICD-10-CM | POA: Diagnosis not present

## 2020-09-08 DIAGNOSIS — K56699 Other intestinal obstruction unspecified as to partial versus complete obstruction: Secondary | ICD-10-CM | POA: Diagnosis not present

## 2020-09-08 DIAGNOSIS — D649 Anemia, unspecified: Secondary | ICD-10-CM | POA: Diagnosis not present

## 2020-09-08 DIAGNOSIS — I959 Hypotension, unspecified: Secondary | ICD-10-CM | POA: Diagnosis not present

## 2020-09-08 DIAGNOSIS — J9610 Chronic respiratory failure, unspecified whether with hypoxia or hypercapnia: Secondary | ICD-10-CM | POA: Diagnosis not present

## 2020-09-08 DIAGNOSIS — I509 Heart failure, unspecified: Secondary | ICD-10-CM | POA: Diagnosis not present

## 2020-09-08 DIAGNOSIS — L89152 Pressure ulcer of sacral region, stage 2: Secondary | ICD-10-CM | POA: Diagnosis not present

## 2020-09-08 DIAGNOSIS — E43 Unspecified severe protein-calorie malnutrition: Secondary | ICD-10-CM | POA: Diagnosis not present

## 2020-09-08 DIAGNOSIS — E440 Moderate protein-calorie malnutrition: Secondary | ICD-10-CM | POA: Diagnosis not present

## 2020-09-08 DIAGNOSIS — R2681 Unsteadiness on feet: Secondary | ICD-10-CM | POA: Diagnosis not present

## 2020-09-08 DIAGNOSIS — J8489 Other specified interstitial pulmonary diseases: Secondary | ICD-10-CM | POA: Diagnosis not present

## 2020-09-08 DIAGNOSIS — I10 Essential (primary) hypertension: Secondary | ICD-10-CM | POA: Diagnosis not present

## 2020-09-08 DIAGNOSIS — Z743 Need for continuous supervision: Secondary | ICD-10-CM | POA: Diagnosis not present

## 2020-09-08 DIAGNOSIS — M6281 Muscle weakness (generalized): Secondary | ICD-10-CM | POA: Diagnosis not present

## 2020-09-08 DIAGNOSIS — R5381 Other malaise: Secondary | ICD-10-CM | POA: Diagnosis not present

## 2020-09-08 DIAGNOSIS — J849 Interstitial pulmonary disease, unspecified: Secondary | ICD-10-CM | POA: Diagnosis not present

## 2020-09-08 DIAGNOSIS — K56609 Unspecified intestinal obstruction, unspecified as to partial versus complete obstruction: Secondary | ICD-10-CM | POA: Diagnosis not present

## 2020-09-08 DIAGNOSIS — N3000 Acute cystitis without hematuria: Secondary | ICD-10-CM | POA: Diagnosis not present

## 2020-09-08 DIAGNOSIS — E46 Unspecified protein-calorie malnutrition: Secondary | ICD-10-CM | POA: Diagnosis not present

## 2020-09-08 DIAGNOSIS — K50012 Crohn's disease of small intestine with intestinal obstruction: Secondary | ICD-10-CM | POA: Diagnosis not present

## 2020-09-08 DIAGNOSIS — Z515 Encounter for palliative care: Secondary | ICD-10-CM | POA: Diagnosis not present

## 2020-09-08 DIAGNOSIS — Z931 Gastrostomy status: Secondary | ICD-10-CM | POA: Diagnosis not present

## 2020-09-08 DIAGNOSIS — R279 Unspecified lack of coordination: Secondary | ICD-10-CM | POA: Diagnosis not present

## 2020-09-08 NOTE — Discharge Summary (Signed)
Physician Discharge Summary  [name] [name] ZWC:[id number] DOB: 11/17/1930 DOA: 08/17/2020  PCP: Hoyt Koch, MD  Admit date: 08/17/2020 Discharge date: 09/08/2020  Admitted From: SNF Disposition: snf  Recommendations for Outpatient Follow-up:  1. Follow up with PCP in 1-2 weeks 2. Please obtain BMP/CBC in one week 3. Please follow up general surgery as recommended.  4. Please follow up with palliative care as outpatient.     Discharge Condition: Guarded.  CODE STATUS:FULL CODE.  Diet recommendation: Regular diet.   Brief/Interim Summary:  87 year old lady with Prior history of chronic hypoxic respiratory failure on 2 L of nasal cannula secondary to interstitial lung disease/pulmonary fibrosis, rheumatoid arthritis, on chronic prednisone, coronary artery disease, CHF, dyslipidemia, history of DVT presents with nausea vomiting and abdominal pain for 3 days prior to admission.  She was found to have SBO/torsion of the small bowel mesentery and underwent exploratory laparotomy and placement of gastrostomy tube on 08/23/2020.  She was slowly started on diet as her bowel function regained. Patient continues to have purulent discharge at the distal end of the abdominal incision requiring  up to 3 times dressing changes every day. General surgery on board, was found to have a tract under the skin that goes to the top of the incision. She will need frequent dressing changes, and IV ancef started by the surgeon for erythema and cellulitis around the G tube.  CT abdomen and pelvis ordered for further evaluation. CT did not reveal any abscesses.  She is on day 3 of antibiotics and is looking much better. She reports her appetite is back and improving.  Plan for SNF when bed available. Plan to complete a 7 day of course of antibiotics, can be changed to oral on discharge.  Discharge Diagnoses:  Active Problems:   ILD (interstitial lung disease) (HCC)   SBO (small bowel obstruction) (HCC)    Protein-calorie malnutrition, severe   Acute on chronic respiratory failure with hypoxemia (HCC)   Hypotension after procedure   Hypernatremia   Pressure injury of skin  SBO/small bowel mesentery torsion S/p exploratory laparotomy, detorsion of the small bowel, placement of Stamm gastrostomy tube on 08/23/2020 by Dr. Michaelle Birks Bowel function regained and she was started on diet.    Distal aspect of the wound opened up and she has purulent discharge which will need 2-3 times of packing. G-tube is clamped, dressing to keep it dry and clean. But she has erythema and cellulitis at the site of the g tube placement. IV ancef started by gen surgery.she has completed 3 days of ancef, transition to oral antibiotics to complete the course.  No nausea,vomiting and abd pain is improving. The purulent discharge is improving.  Therapy evaluation recommending SNF. TOC on board and plan for discharge to SNF when bed available.  Leukocytosis improving.    Acute on chronic respiratory failure, at baseline patient is on 2 L of nasal cannula oxygen. Patient currently at baseline  and doing well. She denies any sob or chest pain.  CT chest does not show any edema or consolidation.    History of interstitial lung disease/pulmonary fibrosis/rheumatoid arthritis on prednisone, Ofev, CellCept and Remicade outpatient infusion.  Currently holding her ILD meds for now. Pt on prednisone 5 mg daily.  Patient denies any chest pain or shortness of breath at this time.    Hypophosphatemia and hypomagnesemia Replaced.   History of coronary artery disease, diastolic heart failure Patient appears euvolemic.   Watch for signs of fluid  overload.   No chest pain or sob.    Postop hypotension in the setting of third space/dehydration resolved. bp parameters are optimal.   Urinary tract infection Growing about 100,000 Pseudomonas aeruginosa and Enterococcus faecalis Completed the course of  antibiotics.    Elevated liver enzymes Resolved. . CT abd and pelvis shows intra and extra hepatic biliary dilatation.     Anemia of blood loss probably from the surgery/normocytic anemia Hemoglobin at baseline,  appears to be around 11 currently is around 9.7.  No signs of bleeding.   Hypokalemia Replaced.    PRESSURE INJURY. Pressure Injury 09/05/20 Sacrum Mid Stage 2 -  Partial thickness loss of dermis presenting as a shallow open injury with a red, pink wound bed without slough. (Active)  09/05/20 2150  Location: Sacrum  Location Orientation: Mid  Staging: Stage 2 -  Partial thickness loss of dermis presenting as a shallow open injury with a red, pink wound bed without slough.  Wound Description (Comments):   Present on Admission: -- (unsure; patient reports she has had discomfort/sore for awhile. Patient just transferred from another floor.)     Pressure Injury 09/05/20 Sacrum Left Stage 2 -  Partial thickness loss of dermis presenting as a shallow open injury with a red, pink wound bed without slough. (Active)  09/05/20 2150  Location: Sacrum  Location Orientation: Left  Staging: Stage 2 -  Partial thickness loss of dermis presenting as a shallow open injury with a red, pink wound bed without slough.  Wound Description (Comments):   Present on Admission: -- (UTA; patient reports she has had soreness for several days. Just transferred from another floor.)   Wound care will be consulted.      Discharge Instructions  Discharge Instructions    Diet - low sodium heart healthy   Complete by: As directed    Diet - low sodium heart healthy   Complete by: As directed    Discharge instructions   Complete by: As directed    [place].   Discharge wound care:   Complete by: As directed    Abdominal incision opened distally, pack BID with DRY gauze and apply dry gauze or ABD pad on top and secure with tape   Discharge  wound care:   Complete by: As directed    Abdominal incision opened distally, pack BID with DRY gauze and apply dry gauze or ABD pad on top and secure with tape  09/03/20 1037   Increase activity slowly   Complete by: As directed      Allergies as of 09/08/2020      Reactions   Crestor [rosuvastatin] Other (See Comments)   Muscle pain   Lactose Intolerance (gi) Other (See Comments)   Stomach upset   Penicillins Hives   Imuran [azathioprine] Nausea And Vomiting      Medication List    STOP taking these medications   aspirin EC 81 MG tablet   atorvastatin 40 MG tablet Commonly known as: LIPITOR   dapsone 100 MG tablet   metoprolol succinate 25 MG 24 hr tablet Commonly known as: Toprol XL   mirtazapine 15 MG tablet Commonly known as: Remeron   nystatin-triamcinolone cream Commonly known as: MYCOLOG II     TAKE these medications   (feeding supplement) PROSource Plus liquid Take 30 mLs by mouth 2 (two) times daily between meals.   cephALEXin 500 MG capsule Commonly known as: KEFLEX Take 1 capsule (500 mg total)  by mouth 3 (three) times daily for 4 days.   docusate sodium 100 MG capsule Commonly known as: COLACE Take 1 capsule (100 mg total) by mouth 2 (two) times daily.   famotidine 20 MG tablet Commonly known as: PEPCID Take 1 tablet (20 mg total) by mouth daily.   fluticasone 50 MCG/ACT nasal spray Commonly known as: FLONASE Place 2 sprays into both nostrils daily as needed for allergies or rhinitis.   furosemide 20 MG tablet Commonly known as: LASIX Take 1 tablet by mouth daily as needed for swelling. What changed:   how much to take  how to take this  when to take this  reasons to take this  additional instructions   mycophenolate 500 MG tablet Commonly known as: CellCept 2 tabs bid What changed:   how much to take  how to take this  when to take this  additional instructions   nitroGLYCERIN 0.4 MG SL tablet Commonly known as:  NITROSTAT Place 1 tablet (0.4 mg total) under the tongue every 5 (five) minutes as needed for chest pain.   Ofev 100 MG Caps Generic drug: Nintedanib Take 1 capsule (100 mg total) by mouth 2 (two) times daily.   predniSONE 5 MG tablet Commonly known as: DELTASONE TAKE 1 TABLET DAILY WITH BREAKFAST   REMICADE IV Inject into the vein as directed. Every 2 months            Discharge Care Instructions  (From admission, onward)         Start     Ordered   09/08/20 0000  Discharge wound care:       Comments: Abdominal incision opened distally, pack BID with DRY gauze and apply dry gauze or ABD pad on top and secure with tape  09/03/20 1037   09/08/20 0905   09/07/20 0000  Discharge wound care:       Comments: Abdominal incision opened distally, pack BID with DRY gauze and apply dry gauze or ABD pad on top and secure with tape   09/07/20 1316          Follow-up Information    [facility] Follow up.   Specialty: Home Health Services Contact information: [place] [address] [phone]        Dwan Bolt, MD. Go on 09/28/2020.   Specialty: General Surgery Why: Your appointment is 3/21 at 9:30am to follow up from your recent abdominal surgery. Please arrive 15 minutes early to check in and fill out paperwork. Please have family member or staff member with you on return to office.   Contact information: [place]. [address] [place] [address] [phone]              Allergies  Allergen Reactions  . Crestor [Rosuvastatin] Other (See Comments)    Muscle pain  . Lactose Intolerance (Gi) Other (See Comments)    Stomach upset  . Penicillins Hives  . Imuran [Azathioprine] Nausea And Vomiting    Consultations:  General surgery  ID   Procedures/Studies: DG Abd 1 View  Result Date: 08/19/2020 CLINICAL DATA:  Vomiting, abdominal pain. EXAM: ABDOMEN - 1 VIEW COMPARISON:  August 18, 2020. FINDINGS: The  bowel gas pattern is normal. No radio-opaque calculi or other significant radiographic abnormality are seen. IMPRESSION: Negative. Electronically Signed   By: Marijo Conception M.D.   On: 08/19/2020 08:13   CT ABDOMEN PELVIS W CONTRAST  Result Date: 09/04/2020 CLINICAL DATA:  Abdominal trauma. EXAM: CT ABDOMEN AND PELVIS WITH CONTRAST TECHNIQUE: Multidetector CT imaging of the abdomen and pelvis was performed using the standard protocol following bolus administration of intravenous contrast. CONTRAST:  50mL OMNIPAQUE IOHEXOL 300 MG/ML  SOLN COMPARISON:  August 21, 2020. FINDINGS: Lower chest: Stable findings consistent with pulmonary fibrosis. Hepatobiliary: No gallstones are noted. Stable gallbladder distention is noted. Stable intrahepatic and extrahepatic biliary dilatation is noted, possibly due to distal stricture. Pancreas: Unremarkable. No pancreatic ductal dilatation or surrounding inflammatory changes. Spleen: Normal in size without focal abnormality. Adrenals/Urinary Tract: Adrenal glands are unremarkable. Kidneys are normal, without renal calculi, focal lesion, or hydronephrosis. Bladder is unremarkable. Stomach/Bowel: Gastrostomy tube is in good position. There does not appear to be any evidence of bowel obstruction or inflammation. Status post appendectomy. Sigmoid diverticulosis is noted without inflammation. Vascular/Lymphatic: Aortic atherosclerosis. No enlarged abdominal or pelvic lymph nodes. Reproductive: Status post hysterectomy. No adnexal masses. Other: Midline surgical staples are noted. No hernia or ascites is noted. Musculoskeletal: No fracture is seen. IMPRESSION: 1. Gastrostomy tube is in good position. 2. Sigmoid diverticulosis without inflammation. 3. Stable gallbladder distention is noted. Stable intrahepatic and extrahepatic biliary dilatation is noted, possibly due to distal stricture. 4. Stable findings consistent with pulmonary fibrosis. 5. Aortic atherosclerosis. Aortic  Atherosclerosis (ICD10-I70.0). Electronically Signed   By: Marijo Conception M.D.   On: 09/04/2020 13:55   CT ABDOMEN PELVIS W CONTRAST  Result Date: 08/21/2020 CLINICAL DATA:  Follow-up small bowel obstruction. EXAM: CT ABDOMEN AND PELVIS WITH CONTRAST TECHNIQUE: Multidetector CT imaging of the abdomen and pelvis was performed using the standard protocol following bolus administration of intravenous contrast. CONTRAST:  28mL OMNIPAQUE IOHEXOL 300 MG/ML  SOLN COMPARISON:  08/17/2020 CT scan. FINDINGS: Lower chest: Severe chronic pulmonary fibrosis again demonstrated. The heart is within normal limits in size. Stable aortic and coronary artery calcifications. Hepatobiliary: No hepatic lesions are identified. Persistent central intrahepatic biliary dilatation significant common bile duct dilatation measuring up to 15 mm. Gallbladder is mildly distended. No obvious gallstones. Pancreas: Moderate pancreatic atrophy but no mass, inflammation or ductal dilatation. Spleen: Normal size.  No focal lesions. Adrenals/Urinary Tract: Adrenal glands and kidneys are unremarkable. The bladder is unremarkable. Stomach/Bowel: Progressive small-bowel obstruction with marked distention of the stomach duodenum and entire small bowel down into the pelvis. There appears to be a transition point associated with an intraluminal lesion. This lesion contains a small amount of gas centrally and is most likely a large gallstone. I believe was present on the prior study also beyond this lesions the distal ileum is decompressed/normal in caliber all the way to the terminal ileum. The colon is decompressed. Vascular/Lymphatic: Stable advanced atherosclerotic calcifications involving the aorta and branch vessels but no aneurysm or dissection. No mesenteric or retroperitoneal mass or adenopathy. Reproductive: Surgically absent. Other: No free air or significant free fluid collections. Musculoskeletal: No significant bony findings. Stable  degenerative changes involving the lumbar spine. IMPRESSION: 1. Progressive small-bowel obstruction with significant gastric and small bowel distension. There is a transition point associated with an intraluminal lesion in the mid distal ileum in the mid pelvis. This is most likely a large gallstone (gallstone ileus). 2. Persistent intra and extrahepatic biliary dilatation without obvious cause. A distal stricture is possible. 3. Stable advanced atherosclerotic calcifications involving the aorta and branch vessels. 4. Severe chronic pulmonary fibrosis. 5. Aortic atherosclerosis. Aortic Atherosclerosis (ICD10-I70.0). Electronically Signed   By: Marijo Sanes M.D.   On: 08/21/2020 20:00   CT ABDOMEN PELVIS W CONTRAST  Result Date: 08/17/2020 CLINICAL DATA:  Acute generalized abdominal pain. EXAM: CT ABDOMEN AND PELVIS WITH CONTRAST TECHNIQUE: Multidetector CT imaging of the abdomen and pelvis was performed using the standard protocol following bolus administration of intravenous contrast. CONTRAST:  160mL OMNIPAQUE IOHEXOL 300 MG/ML  SOLN COMPARISON:  September 28, 2015. FINDINGS: Lower chest: Stable chronic cystic changes seen involving the visualized lung bases as noted on prior exam. Hepatobiliary: No gallstones are noted. Stable intrahepatic and extrahepatic biliary dilatation is noted. Liver is unremarkable. Pancreas: Unremarkable. No pancreatic ductal dilatation or surrounding inflammatory changes. Spleen: Normal in size without focal abnormality. Adrenals/Urinary Tract: Adrenal glands are unremarkable. Kidneys are normal, without renal calculi, focal lesion, or hydronephrosis. Bladder is unremarkable. Stomach/Bowel: The stomach appears normal. Large amount of stool seen in the rectum concerning for impaction. Sigmoid diverticulosis is noted without inflammation. Dilated small bowel loops are noted in the pelvis with probable transition zone seen in the pelvis, concerning for distal small bowel obstruction or  possibly focal ileus or enteritis. Vascular/Lymphatic: Aortic atherosclerosis. No enlarged abdominal or pelvic lymph nodes. Reproductive: Status post hysterectomy. No adnexal masses. Other: No abdominal wall hernia or abnormality. No abdominopelvic ascites. Musculoskeletal: No acute or significant osseous findings. IMPRESSION: 1. Dilated small bowel loops are noted in the pelvis with probable transition zone seen in the pelvis, concerning for distal small bowel obstruction or possibly focal ileus or enteritis. 2. Large amount of stool seen in the rectum concerning for impaction. 3. Sigmoid diverticulosis without inflammation. 4. Stable intrahepatic and extrahepatic biliary dilatation is noted. 5. Aortic atherosclerosis. Aortic Atherosclerosis (ICD10-I70.0). Electronically Signed   By: Marijo Conception M.D.   On: 08/17/2020 08:29   US Abdomen Limited  Result Date: 09/04/2020 CLINICAL DATA:  Elevated liver enzymes. EXAM: ULTRASOUND ABDOMEN LIMITED RIGHT UPPER QUADRANT COMPARISON:  CT of the abdomen and pelvis on 08/21/2020 FINDINGS: Gallbladder: Gallbladder is distended. Gallbladder wall is normal in thickness, 2.0 millimeters. There is no sonographic Murphy's sign. No stones or polyps identified. No pericholecystic fluid. Common bile duct: Diameter: 1.8 centimeters, tapering to 7 millimeters proximal to the pancreatic head. No choledocholithiasis identified. Liver: No focal lesion identified. Within normal limits in parenchymal echogenicity. Portal vein is patent on color Doppler imaging with normal direction of blood flow towards the liver. Other: None. IMPRESSION: 1. Distended gallbladder without evidence for acute cholecystitis. 2. Distended common bile duct, tapering distally in the region of the pancreatic head. 3. No choledocholithiasis identified. Electronically Signed   By: Nolon Nations M.D.   On: 09/04/2020 08:49   DG Chest Port 1 View  Result Date: 08/24/2020 CLINICAL DATA:  Acute respiratory  failure with hypoxemia EXAM: PORTABLE CHEST 1 VIEW COMPARISON:  02/12/2020 FINDINGS: Pulmonary fibrosis by chest CT. The pattern is similar to prior no convincing change in density. Right PICC with tip at the upper right atrium. No effusion or pneumothorax. Stable borderline heart size IMPRESSION: Pulmonary fibrosis with similar pattern to 2021. No acute superimposed disease is seen, but early pneumonia could easily be obscured given the extent chronic changes. Electronically Signed   By: Monte Fantasia M.D.   On: 08/24/2020 06:10   DG Abd Portable 1V  Result Date: 08/18/2020 CLINICAL DATA:  Small bowel obstruction. EXAM: PORTABLE ABDOMEN - 1 VIEW COMPARISON:  CT scan 08/17/2020 FINDINGS: Supine abdomen shows no substantial change in bowel gas pattern since scout image of yesterday's CT scan. Much of the small bowel on the prior CT was fluid-filled making it in apparent on x-ray today. There  is a gas filled loop of small bowel in the left abdomen today measuring approximately 2.5 cm in diameter. Chronic interstitial changes noted in the lung bases. Bones are diffusely demineralized. IMPRESSION: No substantial change in bowel gas pattern since scout image of yesterday's CT scan with an air-filled small bowel loop in the left abdomen now measuring 2.5 cm in diameter. Electronically Signed   By: Misty Stanley M.D.   On: 08/18/2020 07:45   DG [place] Dys Tube Plc W/Fl W/Rad  Result Date: 08/22/2020 CLINICAL DATA:  Inpatient. Small bowel obstruction. Nasogastric feeding tube placement requested under fluoroscopic guidance. EXAM: NASO G TUBE PLACEMENT WITH FL AND WITH RAD CONTRAST:  None. FLUOROSCOPY TIME:  Fluoroscopy Time:  1 minutes 6 seconds Radiation Exposure Index (if provided by the fluoroscopic device): 3.8 mGy Number of Acquired Spot Images: 0 COMPARISON:  08/21/2020 CT abdomen/pelvis. 01/24/2020 chest CT angiogram. FINDINGS: Nasogastric tube placement was attempted under fluoroscopic guidance, however was  unsuccessful due to inability to pass the tube beyond the posterior nasopharynx. IMPRESSION: Technically unsuccessful attempted nasogastric tube placement under fluoroscopic guidance due to obstruction at the level of the posterior nasopharynx. Electronically Signed   By: Ilona Sorrel M.D.   On: 08/22/2020 14:25   DG ATTEMPTED STUDY-NO REPORT  Result Date: 08/22/2020 There is no Radiologist interpretation  for this exam.  [place] EKG SITE RITE  Result Date: 08/21/2020 If Site Rite image not attached, placement could not be confirmed due to current cardiac rhythm.     Subjective: No new complaints.   Discharge Exam: Vitals:   09/07/20 2144 09/08/20 0504  BP: 101/62 93/67  Pulse: 75 82  Resp: 17 16  Temp: 97.9 F (36.6 C) 98.1 F (36.7 C)  SpO2: 94% 92%   Vitals:   09/06/20 2132 09/07/20 0650 09/07/20 2144 09/08/20 0504  BP: (!) 104/53 (!) 94/57 101/62 93/67  Pulse: 85 88 75 82  Resp: 14 16 17 16   Temp: 100 F (37.8 C) 99.5 F (37.5 C) 97.9 F (36.6 C) 98.1 F (36.7 C)  TempSrc: Oral Oral Oral Oral  SpO2: 98% 99% 94% 92%  Weight:  44.5 kg    Height:        General: Pt is alert, awake, not in acute distress Cardiovascular: RRR, S1/S2 +, no rubs, no gallops Respiratory: CTA bilaterally, no wheezing, no rhonchi Abdominal: Soft, NT, ND, bowel sounds + Extremities: no edema, no cyanosis    The results of significant diagnostics from this hospitalization (including imaging, microbiology, ancillary and laboratory) are listed below for reference.     Microbiology: Recent Results (from the past 240 hour(s))  SARS CORONAVIRUS 2 (TAT 6-24 HRS) Nasopharyngeal Nasopharyngeal Swab     Status: None   Collection Time: 09/07/20  9:34 AM   Specimen: Nasopharyngeal Swab  Result Value Ref Range Status   SARS Coronavirus 2 NEGATIVE NEGATIVE Final    Comment: (NOTE) SARS-CoV-2 target nucleic acids are NOT DETECTED.  The SARS-CoV-2 RNA is generally detectable in upper and  lower respiratory specimens during the acute phase of infection. Negative results do not preclude SARS-CoV-2 infection, do not rule out co-infections with other pathogens, and should not be used as the sole basis for treatment or other patient management decisions. Negative results must be combined with clinical observations, patient history, and epidemiological information. The expected result is Negative.  Fact Sheet for Patients: SugarRoll.be  Fact Sheet for Healthcare Providers: https://www.woods-mathews.com/  This test is not yet approved or cleared by the [place] FDA  and  has been authorized for detection and/or diagnosis of SARS-CoV-2 by FDA under an Emergency Use Authorization (EUA). This EUA will remain  in effect (meaning this test can be used) for the duration of the COVID-19 declaration under Se ction 564(b)(1) of the Act, 21 U.S.C. section 360bbb-3(b)(1), unless the authorization is terminated or revoked sooner.  Performed at [facility], [place] [address] [place], [place] [address]      Labs: BNP (last 3 results) Recent Labs    02/12/20 1944  BNP 25.3   Basic Metabolic Panel: Recent Labs  Lab 09/02/20 0412 09/04/20 1132 09/06/20 0604  NA 134* 136 136  K 4.1 3.4* 3.6  CL 99 101 102  CO2 26 26 26   GLUCOSE 82 87 88  BUN 18 14 8   CREATININE 0.53 0.54 0.46  CALCIUM 8.4* 8.3* 8.2*  MG  --  1.5* 1.8  PHOS  --  2.4* 2.0*   Liver Function Tests: Recent Labs  Lab 09/02/20 0412 09/06/20 0604  AST 101* 25  ALT 152* 41  ALKPHOS 88 80  BILITOT 0.4 0.5  PROT 5.8* 5.9*  ALBUMIN 2.4* 2.4*   No results for input(s): LIPASE, AMYLASE in the last 168 hours. No results for input(s): AMMONIA in the last 168 hours. CBC: Recent Labs  Lab 09/02/20 0412 09/03/20 0919 09/04/20 1132 09/06/20 0604 09/07/20 0614  WBC 17.9* 16.0* 14.2* 11.6* 11.9*  NEUTROABS  --   --  10.7*  --   --   HGB 9.9* 9.7* 9.7* 9.6*  9.5*  HCT 31.2* 30.4* 30.5* 29.8* 30.2*  MCV 96.9 96.5 96.5 96.8 96.5  PLT 335 360 364 338 317   Cardiac Enzymes: No results for input(s): CKTOTAL, CKMB, CKMBINDEX, TROPONINI in the last 168 hours. BNP: Invalid input(s): POCBNP CBG: Recent Labs  Lab 09/01/20 1206 09/01/20 1811 09/01/20 2323  GLUCAP 113* 138* 82   D-Dimer No results for input(s): DDIMER in the last 72 hours. Hgb A1c No results for input(s): HGBA1C in the last 72 hours. Lipid Profile No results for input(s): CHOL, HDL, LDLCALC, TRIG, CHOLHDL, LDLDIRECT in the last 72 hours. Thyroid function studies No results for input(s): TSH, T4TOTAL, T3FREE, THYROIDAB in the last 72 hours.  Invalid input(s): FREET3 Anemia work up No results for input(s): VITAMINB12, FOLATE, FERRITIN, TIBC, IRON, RETICCTPCT in the last 72 hours. Urinalysis    Component Value Date/Time   COLORURINE AMBER (A) 08/21/2020 1740   APPEARANCEUR CLOUDY (A) 08/21/2020 1740   LABSPEC 1.036 (H) 08/21/2020 1740   PHURINE 6.0 08/21/2020 1740   GLUCOSEU NEGATIVE 08/21/2020 1740   GLUCOSEU NEGATIVE 01/03/2020 1434   HGBUR MODERATE (A) 08/21/2020 1740   BILIRUBINUR NEGATIVE 08/21/2020 1740   BILIRUBINUR N 01/24/2014 1354   KETONESUR 80 (A) 08/21/2020 1740   PROTEINUR 100 (A) 08/21/2020 1740   UROBILINOGEN 4.0 (A) 01/03/2020 1434   NITRITE NEGATIVE 08/21/2020 1740   LEUKOCYTESUR TRACE (A) 08/21/2020 1740   Sepsis Labs Invalid input(s): PROCALCITONIN,  WBC,  LACTICIDVEN Microbiology Recent Results (from the past 240 hour(s))  SARS CORONAVIRUS 2 (TAT 6-24 HRS) Nasopharyngeal Nasopharyngeal Swab     Status: None   Collection Time: 09/07/20  9:34 AM   Specimen: Nasopharyngeal Swab  Result Value Ref Range Status   SARS Coronavirus 2 NEGATIVE NEGATIVE Final    Comment: (NOTE) SARS-CoV-2 target nucleic acids are NOT DETECTED.  The SARS-CoV-2 RNA is generally detectable in upper and lower respiratory specimens during the acute phase of infection.  Negative results do  not preclude SARS-CoV-2 infection, do not rule out co-infections with other pathogens, and should not be used as the sole basis for treatment or other patient management decisions. Negative results must be combined with clinical observations, patient history, and epidemiological information. The expected result is Negative.  Fact Sheet for Patients: SugarRoll.be  Fact Sheet for Healthcare Providers: https://www.woods-mathews.com/  This test is not yet approved or cleared by the [place] FDA and  has been authorized for detection and/or diagnosis of SARS-CoV-2 by FDA under an Emergency Use Authorization (EUA). This EUA will remain  in effect (meaning this test can be used) for the duration of the COVID-19 declaration under Se ction 564(b)(1) of the Act, 21 U.S.C. section 360bbb-3(b)(1), unless the authorization is terminated or revoked sooner.  Performed at [facility], [place] [address] [place], [place] [address]      Time coordinating discharge: 33 minutes.   SIGNED:   Hosie Poisson, MD  [facility] 09/08/2020, 9:05 AM

## 2020-09-08 NOTE — Progress Notes (Signed)
16 Days Post-Op      Subjective: She is getting ready to go to the nursing facility today.  Erythema around the gastrostomy tube is better.  Objective: Vital signs in last 24 hours: Temp:  [97.9 F (36.6 C)-98.1 F (36.7 C)] 98.1 F (36.7 C) (03/01 0504) Pulse Rate:  [75-82] 82 (03/01 0504) Resp:  [16-17] 16 (03/01 0504) BP: (93-101)/(62-67) 93/67 (03/01 0504) SpO2:  [92 %-94 %] 92 % (03/01 0504) Last BM Date: 09/07/20 480 p.o. 400 urine BM x1 Afebrile, vital signs are stable WBC 11.9  Intake/Output from previous day: 02/28 0701 - 03/01 0700 In: 480 [P.O.:480] Out: 400 [Urine:400] Intake/Output this shift: No intake/output data recorded.  General appearance: alert, cooperative and no distress Resp: clear to auscultation bilaterally GI: Soft, sore, midline incision continues to improve.  G-tube site is better.  Lab Results:  Recent Labs    09/06/20 0604 09/07/20 0614  WBC 11.6* 11.9*  HGB 9.6* 9.5*  HCT 29.8* 30.2*  PLT 338 317    BMET Recent Labs    09/06/20 0604  NA 136  K 3.6  CL 102  CO2 26  GLUCOSE 88  BUN 8  CREATININE 0.46  CALCIUM 8.2*   PT/INR No results for input(s): LABPROT, INR in the last 72 hours.  Recent Labs  Lab 09/02/20 0412 09/06/20 0604  AST 101* 25  ALT 152* 41  ALKPHOS 88 80  BILITOT 0.4 0.5  PROT 5.8* 5.9*  ALBUMIN 2.4* 2.4*     Lipase     Component Value Date/Time   LIPASE 27 08/17/2020 0410     Medications: . (feeding supplement) PROSource Plus  30 mL Oral BID BM  . bisacodyl  10 mg Rectal Daily  . cephALEXin  500 mg Oral TID  . chlorhexidine  15 mL Mouth Rinse BID  . Chlorhexidine Gluconate Cloth  6 each Topical Daily  . docusate sodium  100 mg Oral BID  . enoxaparin (LOVENOX) injection  30 mg Subcutaneous Q24H  . famotidine  20 mg Oral Daily  . feeding supplement  1 Container Oral BID BM  . mouth rinse  15 mL Mouth Rinse q12n4p  . methocarbamol  500 mg Oral Q8H  . potassium & sodium phosphates  1  packet Oral TID WC & HS  . predniSONE  5 mg Oral Q breakfast  . sodium chloride flush  10-40 mL Intracatheter Q12H    Assessment/Plan Pulmonary fibrosis/interstitial lung disease Rheumatoid arthritis - cellcept/Remicade/predinsone - prednisone 5mg  qd currently Hx CAD/CHF Hx of DVT  Anemia Severeprotein-caloriemalnutrition- prealbumin 6.0 (2/14), up to 21.4 (2/21). Off TPN UTI > 100K Pseudomonas  SBO/Torsion of small bowel mesentary S/pExploratory laparotomy, detorsion of small bowel, placement of Stamm Gastrostomy tube, 08/23/20, Michaelle Birks - distal aspect of wound opened and pus drained, pack BID with DRY GAUZE - G tube clamped.Keep G-tube dressing clean and dry, keep G-tube perpendicular with the skin to wait erosion of the wound. - having bowel function - Ok for regular diet. Encourage PO intake. Ok to have meals brought from outside hospital  - Continue PT/OT, mobilize.  - Persistent leukocytosis may be due to tooth or steriods, CTlooks ok.Ok todischarge the patient to SNF when medically ready  FEN: clampG-tube,regdiet, Boost ID: rocephin 2/7- 2/12 UXN:[id number] Follow up: staplesremoved, Dr. Zenia Resides Foley:out  Plan: Patient is going to skilled nursing facility.  I have removed her gastrostomy tube.  She will need a dry dressing Over this and hopefully this will seal in a few  days.  I put some additional instructions in the discharge instructions in the AVS.  I have asked the nurse to reprint it thank you   1  LOS: 22 days    JENNINGS,WILLARD 09/08/2020 Please see Amion

## 2020-09-08 NOTE — TOC Transition Note (Signed)
Transition of Care [facility]) - CM/SW Discharge Note   Patient Details  Name: [name] [name] MRN: [id number] Date of Birth: 1930/07/15  Transition of Care [facility]) CM/SW Contact:  Lynnell Catalan, RN Phone Number: 09/08/2020, 9:20 AM   Clinical Narrative:    Covid test has resulted and [facility] ready to take pt. RN to call report to [phone]. [facility] contacted for transport.   Final next level of care: Skilled Nursing Facility Barriers to Discharge: No Barriers Identified   Patient Goals and CMS Choice Patient states their goals for this hospitalization and ongoing recovery are:: To go home CMS Medicare.gov Compare Post Acute Care list provided to:: Patient Choice offered to / list presented to : Patient  Discharge Placement              Patient chooses bed at: [facility] Patient to be transferred to facility by: [place] Name of family member notified: Patient to notify sister    Discharge Plan and Services   Discharge Planning Services: CM Consult Post Acute Care Choice: [place]: PT,OT [facility] Agency: [place] Date [facility] Agency Contacted: 08/21/20 Time [place]: [phone] Representative spoke with at [place]: [place] ([place]) Interventions     Readmission Risk Interventions No flowsheet data found.

## 2020-09-08 NOTE — Patient Outreach (Signed)
Member screened for potential [facility] Care Management services.  Per [place] (Patient [name] [name]) member resides in [facility].   Communication sent to facility SW to inquire about transition plans and [facility] needs.    Marthenia Rolling, MSN, RN,BSN [place] Acute Care Coordinator [phone] [facility]) [phone]  (Toll free office)

## 2020-09-10 DIAGNOSIS — D649 Anemia, unspecified: Secondary | ICD-10-CM | POA: Diagnosis not present

## 2020-09-10 DIAGNOSIS — J9610 Chronic respiratory failure, unspecified whether with hypoxia or hypercapnia: Secondary | ICD-10-CM | POA: Diagnosis not present

## 2020-09-10 DIAGNOSIS — M6281 Muscle weakness (generalized): Secondary | ICD-10-CM | POA: Diagnosis not present

## 2020-09-10 DIAGNOSIS — K50012 Crohn's disease of small intestine with intestinal obstruction: Secondary | ICD-10-CM | POA: Diagnosis not present

## 2020-09-10 DIAGNOSIS — R2681 Unsteadiness on feet: Secondary | ICD-10-CM | POA: Diagnosis not present

## 2020-09-10 DIAGNOSIS — E782 Mixed hyperlipidemia: Secondary | ICD-10-CM | POA: Diagnosis not present

## 2020-09-10 DIAGNOSIS — I10 Essential (primary) hypertension: Secondary | ICD-10-CM | POA: Diagnosis not present

## 2020-09-10 DIAGNOSIS — Z931 Gastrostomy status: Secondary | ICD-10-CM | POA: Diagnosis not present

## 2020-09-10 DIAGNOSIS — J8489 Other specified interstitial pulmonary diseases: Secondary | ICD-10-CM | POA: Diagnosis not present

## 2020-09-10 DIAGNOSIS — Z86711 Personal history of pulmonary embolism: Secondary | ICD-10-CM | POA: Diagnosis not present

## 2020-09-10 DIAGNOSIS — L89152 Pressure ulcer of sacral region, stage 2: Secondary | ICD-10-CM | POA: Diagnosis not present

## 2020-09-10 DIAGNOSIS — E440 Moderate protein-calorie malnutrition: Secondary | ICD-10-CM | POA: Diagnosis not present

## 2020-09-11 ENCOUNTER — Other Ambulatory Visit: Payer: Self-pay

## 2020-09-11 ENCOUNTER — Non-Acute Institutional Stay: Payer: Self-pay | Admitting: Adult Health Nurse Practitioner

## 2020-09-11 ENCOUNTER — Other Ambulatory Visit: Payer: Self-pay | Admitting: *Deleted

## 2020-09-11 DIAGNOSIS — Z515 Encounter for palliative care: Secondary | ICD-10-CM

## 2020-09-11 DIAGNOSIS — M069 Rheumatoid arthritis, unspecified: Secondary | ICD-10-CM | POA: Diagnosis not present

## 2020-09-11 DIAGNOSIS — T8131XS Disruption of external operation (surgical) wound, not elsewhere classified, sequela: Secondary | ICD-10-CM | POA: Diagnosis not present

## 2020-09-11 DIAGNOSIS — E43 Unspecified severe protein-calorie malnutrition: Secondary | ICD-10-CM

## 2020-09-11 DIAGNOSIS — K56609 Unspecified intestinal obstruction, unspecified as to partial versus complete obstruction: Secondary | ICD-10-CM | POA: Diagnosis not present

## 2020-09-11 DIAGNOSIS — L89152 Pressure ulcer of sacral region, stage 2: Secondary | ICD-10-CM | POA: Diagnosis not present

## 2020-09-11 NOTE — Progress Notes (Signed)
[facility] Palliative Care Consult Note Telephone: [phone]  Fax: [phone]  PATIENT NAME: [name] [name] DOB: 05-Apr-1931 MRN: [id number]  PRIMARY CARE PROVIDER:   Hoyt Koch, MD  REFERRING PROVIDER:  Dr. Arletha Pili PA  RESPONSIBLE PARTY:   Vinnie Level, sister in law, [phone]  Chief complaint:   Initial palliative visit/PCM    RECOMMENDATIONS and PLAN:  1.  Advanced care planning.  Patient is DNR. Discussed having CPR done versus DNR.  Patient wants to be DNR.  Expresses she does not want ventilation or life support.  Left blank MOST form for he to go over with family.  Called sister in law to update on visit.  Left VM with reason for call and call back info  2.  PCM. Patient having continued weight loss and lack of appetite for some time now.  Have discussed with provider at facility to add supplement if not already on one.    3.  Sacral wound.  This is being managed at facility by wound nurse.  4.  Surgical wound dehiscing. Per facility provider this is dehiscing and is currently being treated with antibiotic for infection.  Being managed at facility by wound nurse  5.  RA.  She gets good relief with current regimen of CellCept and remicade infusions.  Contineu remicade infusion when possible.   Patient having decline for some time now.  If she continues to have decline despite rehab she may benefit with hospice services.  Palliative care will continue follow for symptom management/decline and make recommendations as needed.  Follow up in 2 weeks.   I spent 45 minutes providing this consultation, including time spent with patient/family, provider coordination, chart review, documentation. More than 50% of the time in this consultation was spent coordinating communication.   HISTORY OF PRESENT ILLNESS:  [name] [name] is a 87 y.o. year old female with multiple medical problems including PCM, CHF, pulmonary  fibrosis, RA, CAD, iron deficiency anemia, HLD, DJD. Palliative Care was asked to help address goals of care. Patient evaluated in ER on 12/28/19 for fall with abrasion to forehead and no other acute findings.  Hospitalized 7/16-7/19/21 for fall believed to be due to starting antifibrotic medicine for pulmonary fibrosis which caused dizziness.  ED visit 02/12/20 for SOB and chest pain with no acute findings.  Hospitalized 2/7-09/08/20 for SBO.  Patient was living alone with help from family and friends with cooking and house cleaning.  She is now at [facility] for short term rehab.  Patient states that she has no appetite.  States that 2 years ago her base weight was 140 pounds.  She had dropped down to 104 pounds with BMI of 17.7 and after recent hospitalization she is now down to 98 pounds with BMI of 16.58.She is weak after past lengthy hospital stay but is able to get up with walker and walk with assistance.  She states having dizziness in the mornings before getting up and will stay in bed until it subsides.  This has been ongoing for a long time.  Had some diarrhea yesterday she states related to what she ate.  Patient has pain to tailbone related to stage II sacral wound and abdominal pain related to surgical infection of SBO and Gtube removal. Denies pain related to her RA.  States her remicade infusions give her good pain relief.  Rest of ROS asked and negative.  CODE STATUS: DNR  PPS: 40% HOSPICE ELIGIBILITY/DIAGNOSIS: TBD  PHYSICAL  EXAM:  HR 79 O2 99% on 1.5L General: NAD, frail appearing, thin Eyes: sclera anicteric and noninjected with no discharge noted ENMT: dry mucous membranes Cardiovascular: regular rate and rhythm Pulmonary: Lung sounds diminished with no adventitious breath sounds heard; normal respiratory effort Abdomen: soft, nontender, + bowel sounds; Bandage over surgical wound from Gtube being removed (did not take off bandage today) Extremities: no edema, no joint deformities Skin: no  rashes on exposed skin Neurological: Weakness but otherwise nonfocal  FamHx      Family History  Problem Relation Age of Onset  . Kidney disease Daughter 5  . Cancer Daughter   . Heart attack Father 2  . Alzheimer's disease Sister   . Heart disease Sister   . Alzheimer's disease Sister      PAST MEDICAL HISTORY:  Past Medical History:  Diagnosis Date  . Allergic rhinitis   . Allergy   . CAD (coronary artery disease)    Mild CAD by cath 2008  . CHF (congestive heart failure) ([place])   . DJD (degenerative joint disease)    rheumatoid  . DVT (deep venous thrombosis) ([place])   . Dyslipidemia   . History of echocardiogram    Echo 12/2018: EF 60-65, mod asymmetric LVH, Gr 1 DD  . History of nuclear stress test    Myoview 5/17:  EF 74%, normal perfusion, low risk // Myoview 12/2018:  EF 89, very mild ischemia in inferoapical wall; Low Risk  . History of pulmonary embolism   . Hyperlipidemia   . Insomnia   . Osteoporosis   . Psoriasis   . Pulmonary fibrosis ([place])   . Rheumatoid arthritis ([place])     SOCIAL HX:  Social History   Tobacco Use  . Smoking status: Former Smoker    Packs/day: 0.50    Years: 20.00    Pack years: 10.00    Types: Cigarettes    Quit date: 07/11/1973    Years since quitting: 47.2  . Smokeless tobacco: Never Used  Substance Use Topics  . Alcohol use: No    ALLERGIES:  Allergies  Allergen Reactions  . Crestor [Rosuvastatin] Other (See Comments)    Muscle pain  . Lactose Intolerance (Gi) Other (See Comments)    Stomach upset  . Penicillins Hives  . Imuran [Azathioprine] Nausea And Vomiting     PERTINENT MEDICATIONS:  Outpatient Encounter Medications as of 09/11/2020  Medication Sig  . cephALEXin (KEFLEX) 500 MG capsule Take 1 capsule (500 mg total) by mouth 3 (three) times daily for 4 days.  [name] docusate sodium (COLACE) 100 MG capsule Take 1 capsule (100 mg total) by mouth 2 (two) times daily.  . famotidine (PEPCID) 20 MG tablet Take 1 tablet  (20 mg total) by mouth daily.  . fluticasone (FLONASE) 50 MCG/ACT nasal spray Place 2 sprays into both nostrils daily as needed for allergies or rhinitis.  . furosemide (LASIX) 20 MG tablet Take 1 tablet by mouth daily as needed for swelling. (Patient taking differently: Take 20 mg by mouth daily as needed for fluid or edema.)  . InFLIXimab (REMICADE IV) Inject into the vein as directed. Every 2 months  . mycophenolate (CELLCEPT) 500 MG tablet 2 tabs bid (Patient taking differently: Take 1,000 mg by mouth 2 (two) times daily.)  . Nintedanib (OFEV) 100 MG CAPS Take 1 capsule (100 mg total) by mouth 2 (two) times daily.  . nitroGLYCERIN (NITROSTAT) 0.4 MG SL tablet Place 1 tablet (0.4 mg total) under the tongue every 5 (five)  minutes as needed for chest pain.  . Nutritional Supplements (,FEEDING SUPPLEMENT, PROSOURCE PLUS) liquid Take 30 mLs by mouth 2 (two) times daily between meals.  . predniSONE (DELTASONE) 5 MG tablet TAKE 1 TABLET DAILY WITH BREAKFAST (Patient taking differently: Take 5 mg by mouth daily with breakfast.)   No facility-administered encounter medications on file as of 09/11/2020.      Khailee Mick [name], NP

## 2020-09-11 NOTE — Patient Outreach (Signed)
[facility] Post- Acute Care Coordinator follow up. Member screened for potential [facility] needs.   Update received from [place] indicating member's care plan meeting is scheduled for today. [name] [name] recently admitted to [facility].   Noted [facility] palliative is addressing goals of care while member resides in SNF.   [name] [name]'s PCP has [facility] embedded care coordination team. Will follow for potential [facility] care coordination needs while member resides in SNF.   Marthenia Rolling, MSN, RN,BSN [place] Acute Care Coordinator [phone] [facility]) [phone]  (Toll free office)

## 2020-09-15 ENCOUNTER — Other Ambulatory Visit: Payer: Self-pay

## 2020-09-15 ENCOUNTER — Non-Acute Institutional Stay: Payer: Self-pay | Admitting: Adult Health Nurse Practitioner

## 2020-09-15 DIAGNOSIS — M6281 Muscle weakness (generalized): Secondary | ICD-10-CM | POA: Diagnosis not present

## 2020-09-15 DIAGNOSIS — L89152 Pressure ulcer of sacral region, stage 2: Secondary | ICD-10-CM | POA: Diagnosis not present

## 2020-09-15 DIAGNOSIS — T8131XS Disruption of external operation (surgical) wound, not elsewhere classified, sequela: Secondary | ICD-10-CM

## 2020-09-15 DIAGNOSIS — J9610 Chronic respiratory failure, unspecified whether with hypoxia or hypercapnia: Secondary | ICD-10-CM | POA: Diagnosis not present

## 2020-09-15 DIAGNOSIS — E782 Mixed hyperlipidemia: Secondary | ICD-10-CM | POA: Diagnosis not present

## 2020-09-15 DIAGNOSIS — Z931 Gastrostomy status: Secondary | ICD-10-CM | POA: Diagnosis not present

## 2020-09-15 DIAGNOSIS — K50012 Crohn's disease of small intestine with intestinal obstruction: Secondary | ICD-10-CM | POA: Diagnosis not present

## 2020-09-15 DIAGNOSIS — R2681 Unsteadiness on feet: Secondary | ICD-10-CM | POA: Diagnosis not present

## 2020-09-15 DIAGNOSIS — E43 Unspecified severe protein-calorie malnutrition: Secondary | ICD-10-CM

## 2020-09-15 DIAGNOSIS — I10 Essential (primary) hypertension: Secondary | ICD-10-CM | POA: Diagnosis not present

## 2020-09-15 DIAGNOSIS — T8189XA Other complications of procedures, not elsewhere classified, initial encounter: Secondary | ICD-10-CM | POA: Diagnosis not present

## 2020-09-15 DIAGNOSIS — E46 Unspecified protein-calorie malnutrition: Secondary | ICD-10-CM | POA: Diagnosis not present

## 2020-09-15 DIAGNOSIS — E440 Moderate protein-calorie malnutrition: Secondary | ICD-10-CM | POA: Diagnosis not present

## 2020-09-15 DIAGNOSIS — D649 Anemia, unspecified: Secondary | ICD-10-CM | POA: Diagnosis not present

## 2020-09-15 DIAGNOSIS — Z86711 Personal history of pulmonary embolism: Secondary | ICD-10-CM | POA: Diagnosis not present

## 2020-09-15 DIAGNOSIS — J8489 Other specified interstitial pulmonary diseases: Secondary | ICD-10-CM | POA: Diagnosis not present

## 2020-09-15 DIAGNOSIS — R5381 Other malaise: Secondary | ICD-10-CM | POA: Diagnosis not present

## 2020-09-15 DIAGNOSIS — Z515 Encounter for palliative care: Secondary | ICD-10-CM

## 2020-09-15 NOTE — Progress Notes (Signed)
[facility] Palliative Care Consult Note Telephone: [phone]  Fax: [phone]  PATIENT NAME: [name] [name] DOB: 08-24-1930 MRN: [id number]  PRIMARY CARE PROVIDER:   Hoyt Koch, MD  REFERRING PROVIDER:  Dr. Arletha Pili PA  RESPONSIBLE PARTY:   Vinnie Level, sister in law, [phone]  Chief complaint:   Initial palliative visit/PCM   RECOMMENDATIONS and PLAN:  1.  Advanced care planning.  Patient last week expressed wanting to be DNR, see note from 09/11/20.  Today staff reports that she is full code.  Patient does not seem to remember conversation from last week.  She is A&Ox3 but appears to be forgetful. Today patient is in pain and is having to go to bathroom.  Call light was turned on and unable to find anyone in hallway to help her.  She is not up to having this conversation today. Have encouraged her to discuss these wishes with her family.  Have left messages with sister in law and waiting call back.   2.  PCM.  Patient states that her appetite is improving.  No weight from last week to compare.  Continue supportive care at the facility  3.  Surgical wound dehiscing.  Spoke with facility provider, the wound is worsening and he has recommended surgical consult.    4.  Sacral wound.  Continue wound care at the facility.  Palliative care will continue follow for symptom management/decline and make recommendations as needed.  Follow up in 2 weeks.   I spent 35 minutes providing this consultation,including time with patient, provider coordination, chart review, documentation. More than 50% of the time in this consultation was spent coordinating communication.   HISTORY OF PRESENT ILLNESS:  AMIT MELOY is a 87 y.o. year old female with multiple medical problems including PCM, CHF, pulmonary fibrosis, RA, CAD, iron deficiency anemia, HLD, DJD. Palliative Care was asked to help address goals of care. Patient's  surgical wound has worsened per facility provider.  Patient states that after the dressing change she has a lot of pain from the surgical wound.  The pain is worsened by having to have a BM.  She denies increased SOB, cough, dysuria, constipation.  She states that she has been told that her sacral wound is not getting any better.  This is being managed by wound care nurse at the facility.    CODE STATUS: see above  PPS: 40% HOSPICE ELIGIBILITY/DIAGNOSIS: TBD  PHYSICAL EXAM:  HR 82  O2 98% on 2L General: NAD, frail appearing, thin Eyes: sclera anicteric and noninjected with no discharge noted ENMT: dry mucous membranes Cardiovascular: regular rate and rhythm Pulmonary: Lung sounds diminished with no adventitious breath sounds heard; normal respiratory effort Abdomen: soft, nontender, + bowel sounds; Bandage over surgical wound from Gtube being removed (did not take off bandage today); sacral wound (did not take off bandage) Extremities: no edema, no joint deformities Skin: no rashes on exposed skin Neurological: Weakness but otherwise nonfocal  PAST MEDICAL HISTORY:  Past Medical History:  Diagnosis Date   Allergic rhinitis    Allergy    CAD (coronary artery disease)    Mild CAD by cath 2008   CHF (congestive heart failure) ([facility])    DJD (degenerative joint disease)    rheumatoid   DVT (deep venous thrombosis) ([facility])    Dyslipidemia    History of echocardiogram    Echo 12/2018: EF 60-65, mod asymmetric LVH, Gr 1 DD   History of nuclear stress  test    Myoview 5/17:  EF 74%, normal perfusion, low risk // Myoview 12/2018:  EF 89, very mild ischemia in inferoapical wall; Low Risk   History of pulmonary embolism    Hyperlipidemia    Insomnia    Osteoporosis    Psoriasis    Pulmonary fibrosis ([facility])    Rheumatoid arthritis ([place])     SOCIAL HX:  Social History   Tobacco Use   Smoking status: Former Smoker    Packs/day: 0.50    Years: 20.00    Pack years: 10.00     Types: Cigarettes    Quit date: 07/11/1973    Years since quitting: 47.2   Smokeless tobacco: Never Used  Substance Use Topics   Alcohol use: No    ALLERGIES:  Allergies  Allergen Reactions   Crestor [Rosuvastatin] Other (See Comments)    Muscle pain   Lactose Intolerance (Gi) Other (See Comments)    Stomach upset   Penicillins Hives   Imuran [Azathioprine] Nausea And Vomiting     PERTINENT MEDICATIONS:  Outpatient Encounter Medications as of 09/15/2020  Medication Sig   docusate sodium (COLACE) 100 MG capsule Take 1 capsule (100 mg total) by mouth 2 (two) times daily.   famotidine (PEPCID) 20 MG tablet Take 1 tablet (20 mg total) by mouth daily.   fluticasone (FLONASE) 50 MCG/ACT nasal spray Place 2 sprays into both nostrils daily as needed for allergies or rhinitis.   furosemide (LASIX) 20 MG tablet Take 1 tablet by mouth daily as needed for swelling. (Patient taking differently: Take 20 mg by mouth daily as needed for fluid or edema.)   InFLIXimab (REMICADE IV) Inject into the vein as directed. Every 2 months   mycophenolate (CELLCEPT) 500 MG tablet 2 tabs bid (Patient taking differently: Take 1,000 mg by mouth 2 (two) times daily.)   Nintedanib (OFEV) 100 MG CAPS Take 1 capsule (100 mg total) by mouth 2 (two) times daily.   nitroGLYCERIN (NITROSTAT) 0.4 MG SL tablet Place 1 tablet (0.4 mg total) under the tongue every 5 (five) minutes as needed for chest pain.   Nutritional Supplements (,FEEDING SUPPLEMENT, PROSOURCE PLUS) liquid Take 30 mLs by mouth 2 (two) times daily between meals.   predniSONE (DELTASONE) 5 MG tablet TAKE 1 TABLET DAILY WITH BREAKFAST (Patient taking differently: Take 5 mg by mouth daily with breakfast.)   No facility-administered encounter medications on file as of 09/15/2020.     Jaxon Flatt [name], NP

## 2020-09-17 DIAGNOSIS — M6281 Muscle weakness (generalized): Secondary | ICD-10-CM | POA: Diagnosis not present

## 2020-09-17 DIAGNOSIS — J9610 Chronic respiratory failure, unspecified whether with hypoxia or hypercapnia: Secondary | ICD-10-CM | POA: Diagnosis not present

## 2020-09-17 DIAGNOSIS — L89152 Pressure ulcer of sacral region, stage 2: Secondary | ICD-10-CM | POA: Diagnosis not present

## 2020-09-17 DIAGNOSIS — R2681 Unsteadiness on feet: Secondary | ICD-10-CM | POA: Diagnosis not present

## 2020-09-17 DIAGNOSIS — Z931 Gastrostomy status: Secondary | ICD-10-CM | POA: Diagnosis not present

## 2020-09-17 DIAGNOSIS — I10 Essential (primary) hypertension: Secondary | ICD-10-CM | POA: Diagnosis not present

## 2020-09-17 DIAGNOSIS — D649 Anemia, unspecified: Secondary | ICD-10-CM | POA: Diagnosis not present

## 2020-09-17 DIAGNOSIS — K50012 Crohn's disease of small intestine with intestinal obstruction: Secondary | ICD-10-CM | POA: Diagnosis not present

## 2020-09-17 DIAGNOSIS — K56609 Unspecified intestinal obstruction, unspecified as to partial versus complete obstruction: Secondary | ICD-10-CM | POA: Diagnosis not present

## 2020-09-17 DIAGNOSIS — J8489 Other specified interstitial pulmonary diseases: Secondary | ICD-10-CM | POA: Diagnosis not present

## 2020-09-17 DIAGNOSIS — Z86711 Personal history of pulmonary embolism: Secondary | ICD-10-CM | POA: Diagnosis not present

## 2020-09-17 DIAGNOSIS — E440 Moderate protein-calorie malnutrition: Secondary | ICD-10-CM | POA: Diagnosis not present

## 2020-09-17 DIAGNOSIS — E782 Mixed hyperlipidemia: Secondary | ICD-10-CM | POA: Diagnosis not present

## 2020-09-18 DIAGNOSIS — R5381 Other malaise: Secondary | ICD-10-CM | POA: Diagnosis not present

## 2020-09-18 DIAGNOSIS — K56609 Unspecified intestinal obstruction, unspecified as to partial versus complete obstruction: Secondary | ICD-10-CM | POA: Diagnosis not present

## 2020-09-18 DIAGNOSIS — I509 Heart failure, unspecified: Secondary | ICD-10-CM | POA: Diagnosis not present

## 2020-09-18 DIAGNOSIS — E46 Unspecified protein-calorie malnutrition: Secondary | ICD-10-CM | POA: Diagnosis not present

## 2020-09-21 ENCOUNTER — Other Ambulatory Visit: Payer: Self-pay | Admitting: *Deleted

## 2020-09-21 DIAGNOSIS — Z7982 Long term (current) use of aspirin: Secondary | ICD-10-CM | POA: Diagnosis not present

## 2020-09-21 DIAGNOSIS — I2510 Atherosclerotic heart disease of native coronary artery without angina pectoris: Secondary | ICD-10-CM | POA: Diagnosis not present

## 2020-09-21 DIAGNOSIS — M069 Rheumatoid arthritis, unspecified: Secondary | ICD-10-CM | POA: Diagnosis not present

## 2020-09-21 DIAGNOSIS — I5032 Chronic diastolic (congestive) heart failure: Secondary | ICD-10-CM | POA: Diagnosis not present

## 2020-09-21 DIAGNOSIS — T8131XA Disruption of external operation (surgical) wound, not elsewhere classified, initial encounter: Secondary | ICD-10-CM | POA: Diagnosis not present

## 2020-09-21 DIAGNOSIS — E43 Unspecified severe protein-calorie malnutrition: Secondary | ICD-10-CM | POA: Diagnosis not present

## 2020-09-21 NOTE — Patient Outreach (Signed)
[place] Coordinator follow up. Member screened for potential [facility] needs.   Verified in [facility] (Patient [name] [name]) that member transitioned from [facility] on Friday, 09/18/20.  Telephone call made to Mrs. Tomkiewicz [phone]. Patient identifiers confirmed. Mrs. Deutscher reports she has a friend staying with her post SNF. [facility] home health made visit today. States PCP appointment scheduled for this week.   Mrs. Kaseman was previously active with [facility]. PCP office has [facility] embedded care coordination chronic care management team. Due to high risk for readmission, will make referral to [facility].   Discussed [facility] referral. Mrs. Harwood is agreeable.  According to chart notes Mrs. Felling has sacral wound, CHF, CAD, HLD.  Will place [facility] referral.    Marthenia Rolling, MSN, RN,BSN [place] Acute Care Coordinator [phone] [facility]) [phone]  (Toll free office)

## 2020-09-22 ENCOUNTER — Other Ambulatory Visit: Payer: Self-pay | Admitting: *Deleted

## 2020-09-22 DIAGNOSIS — M069 Rheumatoid arthritis, unspecified: Secondary | ICD-10-CM | POA: Diagnosis not present

## 2020-09-22 DIAGNOSIS — I5032 Chronic diastolic (congestive) heart failure: Secondary | ICD-10-CM | POA: Diagnosis not present

## 2020-09-22 DIAGNOSIS — E43 Unspecified severe protein-calorie malnutrition: Secondary | ICD-10-CM | POA: Diagnosis not present

## 2020-09-22 DIAGNOSIS — Z7982 Long term (current) use of aspirin: Secondary | ICD-10-CM | POA: Diagnosis not present

## 2020-09-22 DIAGNOSIS — T8131XA Disruption of external operation (surgical) wound, not elsewhere classified, initial encounter: Secondary | ICD-10-CM | POA: Diagnosis not present

## 2020-09-22 DIAGNOSIS — I2510 Atherosclerotic heart disease of native coronary artery without angina pectoris: Secondary | ICD-10-CM | POA: Diagnosis not present

## 2020-09-22 NOTE — Patient Outreach (Signed)
[facility] Post- Acute Care Coordinator follow up.   Received correspondence from [facility] stating home visit was scheduled for later this week. However, Mrs. Stetzel called [facility] back to cancel the home visit. Cited she wants to speak with her PCP first before engaging with [facility].   [facility] states they will call Mrs. Vandevender back later in the week to see if she has changed her mind.  Mrs. Benavides is high risk for readmission. She could benefit from additional support from [facility].   If she does not engage with [facility]. Writer will make referral to [place] team with PCP office for complex case management.   Will await to hear from [facility] whether patient engages with them or not.   Will also send message to PCP to make aware of above.     Marthenia Rolling, MSN, RN,BSN [place] Acute Care Coordinator [phone] [facility]) [phone]  (Toll free office)

## 2020-09-24 ENCOUNTER — Telehealth: Payer: Self-pay | Admitting: Internal Medicine

## 2020-09-24 ENCOUNTER — Inpatient Hospital Stay: Payer: Medicare Other | Admitting: Pulmonary Disease

## 2020-09-24 DIAGNOSIS — I2510 Atherosclerotic heart disease of native coronary artery without angina pectoris: Secondary | ICD-10-CM | POA: Diagnosis not present

## 2020-09-24 DIAGNOSIS — M069 Rheumatoid arthritis, unspecified: Secondary | ICD-10-CM | POA: Diagnosis not present

## 2020-09-24 DIAGNOSIS — I5032 Chronic diastolic (congestive) heart failure: Secondary | ICD-10-CM | POA: Diagnosis not present

## 2020-09-24 DIAGNOSIS — Z7982 Long term (current) use of aspirin: Secondary | ICD-10-CM | POA: Diagnosis not present

## 2020-09-24 DIAGNOSIS — T8131XA Disruption of external operation (surgical) wound, not elsewhere classified, initial encounter: Secondary | ICD-10-CM | POA: Diagnosis not present

## 2020-09-24 DIAGNOSIS — E43 Unspecified severe protein-calorie malnutrition: Secondary | ICD-10-CM | POA: Diagnosis not present

## 2020-09-24 NOTE — Telephone Encounter (Signed)
See below

## 2020-09-24 NOTE — Telephone Encounter (Signed)
Liji w/ [facility] is requesting verbals for PT for 2w5, 1w3. Please advise   Okay to LVM: [phone]

## 2020-09-24 NOTE — Telephone Encounter (Signed)
Fine

## 2020-09-25 ENCOUNTER — Ambulatory Visit: Payer: Medicare Other | Admitting: Internal Medicine

## 2020-09-25 DIAGNOSIS — I2510 Atherosclerotic heart disease of native coronary artery without angina pectoris: Secondary | ICD-10-CM | POA: Diagnosis not present

## 2020-09-25 DIAGNOSIS — I5032 Chronic diastolic (congestive) heart failure: Secondary | ICD-10-CM | POA: Diagnosis not present

## 2020-09-25 DIAGNOSIS — M069 Rheumatoid arthritis, unspecified: Secondary | ICD-10-CM | POA: Diagnosis not present

## 2020-09-25 DIAGNOSIS — E43 Unspecified severe protein-calorie malnutrition: Secondary | ICD-10-CM | POA: Diagnosis not present

## 2020-09-25 DIAGNOSIS — Z7982 Long term (current) use of aspirin: Secondary | ICD-10-CM | POA: Diagnosis not present

## 2020-09-25 DIAGNOSIS — T8131XA Disruption of external operation (surgical) wound, not elsewhere classified, initial encounter: Secondary | ICD-10-CM | POA: Diagnosis not present

## 2020-09-25 NOTE — Telephone Encounter (Signed)
Spoke with Liji from [facility] to give verbal orders. No other questions or concerns at this time.

## 2020-09-28 DIAGNOSIS — Z7982 Long term (current) use of aspirin: Secondary | ICD-10-CM | POA: Diagnosis not present

## 2020-09-28 DIAGNOSIS — T8131XA Disruption of external operation (surgical) wound, not elsewhere classified, initial encounter: Secondary | ICD-10-CM | POA: Diagnosis not present

## 2020-09-28 DIAGNOSIS — I5032 Chronic diastolic (congestive) heart failure: Secondary | ICD-10-CM | POA: Diagnosis not present

## 2020-09-28 DIAGNOSIS — I2510 Atherosclerotic heart disease of native coronary artery without angina pectoris: Secondary | ICD-10-CM | POA: Diagnosis not present

## 2020-09-28 DIAGNOSIS — M069 Rheumatoid arthritis, unspecified: Secondary | ICD-10-CM | POA: Diagnosis not present

## 2020-09-28 DIAGNOSIS — E43 Unspecified severe protein-calorie malnutrition: Secondary | ICD-10-CM | POA: Diagnosis not present

## 2020-09-29 ENCOUNTER — Encounter: Payer: Self-pay | Admitting: Internal Medicine

## 2020-09-29 ENCOUNTER — Telehealth: Payer: Self-pay | Admitting: Internal Medicine

## 2020-09-29 ENCOUNTER — Other Ambulatory Visit: Payer: Self-pay | Admitting: Internal Medicine

## 2020-09-29 ENCOUNTER — Other Ambulatory Visit: Payer: Self-pay

## 2020-09-29 ENCOUNTER — Ambulatory Visit (INDEPENDENT_AMBULATORY_CARE_PROVIDER_SITE_OTHER): Payer: Medicare Other | Admitting: Internal Medicine

## 2020-09-29 VITALS — BP 130/70 | HR 101 | Temp 97.7°F | Resp 18 | Ht 64.5 in | Wt 87.8 lb

## 2020-09-29 DIAGNOSIS — R197 Diarrhea, unspecified: Secondary | ICD-10-CM | POA: Diagnosis not present

## 2020-09-29 DIAGNOSIS — K909 Intestinal malabsorption, unspecified: Secondary | ICD-10-CM | POA: Diagnosis not present

## 2020-09-29 DIAGNOSIS — E785 Hyperlipidemia, unspecified: Secondary | ICD-10-CM

## 2020-09-29 DIAGNOSIS — E43 Unspecified severe protein-calorie malnutrition: Secondary | ICD-10-CM

## 2020-09-29 DIAGNOSIS — I5032 Chronic diastolic (congestive) heart failure: Secondary | ICD-10-CM | POA: Diagnosis not present

## 2020-09-29 DIAGNOSIS — R634 Abnormal weight loss: Secondary | ICD-10-CM | POA: Diagnosis not present

## 2020-09-29 DIAGNOSIS — Z7982 Long term (current) use of aspirin: Secondary | ICD-10-CM | POA: Diagnosis not present

## 2020-09-29 DIAGNOSIS — M069 Rheumatoid arthritis, unspecified: Secondary | ICD-10-CM

## 2020-09-29 DIAGNOSIS — R0602 Shortness of breath: Secondary | ICD-10-CM | POA: Diagnosis not present

## 2020-09-29 DIAGNOSIS — T8131XA Disruption of external operation (surgical) wound, not elsewhere classified, initial encounter: Secondary | ICD-10-CM | POA: Diagnosis not present

## 2020-09-29 DIAGNOSIS — I2510 Atherosclerotic heart disease of native coronary artery without angina pectoris: Secondary | ICD-10-CM | POA: Diagnosis not present

## 2020-09-29 DIAGNOSIS — R0789 Other chest pain: Secondary | ICD-10-CM

## 2020-09-29 LAB — LIPID PANEL
Cholesterol: 143 mg/dL (ref 0–200)
HDL: 42.2 mg/dL (ref >39.00)
NonHDL: 101.11
Total CHOL/HDL Ratio: 3
Triglycerides: 387 mg/dL — ABNORMAL HIGH (ref 0.0–149.0)
VLDL: 77.4 mg/dL — ABNORMAL HIGH (ref 0.0–40.0)

## 2020-09-29 LAB — COMPREHENSIVE METABOLIC PANEL
ALT: 13 U/L (ref 0–35)
AST: 19 U/L (ref 0–37)
Albumin: 2.9 g/dL — ABNORMAL LOW (ref 3.5–5.2)
Alkaline Phosphatase: 114 U/L (ref 39–117)
BUN: 9 mg/dL (ref 6–23)
CO2: 28 mEq/L (ref 19–32)
Calcium: 8.7 mg/dL (ref 8.4–10.5)
Chloride: 105 mEq/L (ref 96–112)
Creatinine, Ser: 0.46 mg/dL (ref 0.40–1.20)
GFR: 85.7 mL/min (ref >60.00)
Glucose, Bld: 146 mg/dL — ABNORMAL HIGH (ref 70–99)
Potassium: 2.7 mEq/L — CL (ref 3.5–5.1)
Sodium: 141 mEq/L (ref 135–145)
Total Bilirubin: 0.5 mg/dL (ref 0.2–1.2)
Total Protein: 6.4 g/dL (ref 6.0–8.3)

## 2020-09-29 LAB — CBC
HCT: 35.8 % — ABNORMAL LOW (ref 36.0–46.0)
Hemoglobin: 11.7 g/dL — ABNORMAL LOW (ref 12.0–15.0)
MCHC: 32.7 g/dL (ref 30.0–36.0)
MCV: 92.5 fl (ref 78.0–100.0)
Platelets: 226 10*3/uL (ref 150.0–400.0)
RBC: 3.87 Mil/uL (ref 3.87–5.11)
RDW: 17.4 % — ABNORMAL HIGH (ref 11.5–15.5)
WBC: 10.4 10*3/uL (ref 4.0–10.5)

## 2020-09-29 LAB — LIPASE: Lipase: 35 U/L (ref 11.0–59.0)

## 2020-09-29 LAB — TROPONIN I (HIGH SENSITIVITY): High Sens Troponin I: 13 ng/L (ref 2–17)

## 2020-09-29 LAB — BRAIN NATRIURETIC PEPTIDE: Pro B Natriuretic peptide (BNP): 58 pg/mL (ref 0.0–100.0)

## 2020-09-29 MED ORDER — POTASSIUM CHLORIDE ER 10 MEQ PO TBCR: EXTENDED_RELEASE_TABLET | ORAL | 1 refills | Status: DC

## 2020-09-29 MED ORDER — MUPIROCIN 2 % EX OINT: 1.0000 "application " | TOPICAL_OINTMENT | Freq: Two times a day (BID) | CUTANEOUS | 0 refills | Status: DC

## 2020-09-29 NOTE — Telephone Encounter (Signed)
Called by [facility] - her potassium from today was 2.7.  Looks like it runs on the lower side of normal.     Called her - no current concerning symptoms.  Start K dur 20 meq bid x 2 days and then daily.    Advised pcp will advised if retesting is needed and when.

## 2020-09-29 NOTE — Progress Notes (Signed)
Subjective:   Patient ID: [name] [name], female    DOB: 1931-01-30, 87 y.o.   MRN: [id number]  HPI The patient is an 87 YO female coming in for hospital/rehab follow up (in for hospital with complicated admission for SBO requiring surgery with resection of small bowel, wound still present and packed, seen surgery several times). Overall is improving but very slowly. She is still tired and has poor appetite. She is having diarrhea after eating. Not in between meals. The stool is loose but not liquid. Denies blood in stool. She also has a lesion on the right middle finger. Has had one before and treated with topical. Not really that sore but present. Denies fevers or chills. Wound is still hurting and does hurt some to eat. She is drinking liquids and meal supplements. Denies nausea or vomiting. Given the recent SBO surgeon does not want her to try any imodium. Still off her remicade as they wanted to wait until she was recovered mostly. Taking vicodin for pain 1-2 times per week and tylenol if needed rest of time.   PMH, [place], social history reviewed and updated  Review of Systems  Constitutional: Positive for activity change, appetite change, fatigue and unexpected weight change.  HENT: Negative.   Eyes: Negative.   Respiratory: Negative for cough, chest tightness and shortness of breath.   Cardiovascular: Negative for chest pain, palpitations and leg swelling.  Gastrointestinal: Positive for abdominal pain and diarrhea. Negative for abdominal distention, anal bleeding, blood in stool, constipation, nausea, rectal pain and vomiting.  Musculoskeletal: Positive for gait problem.  Skin: Negative.   Neurological: Positive for weakness.  Psychiatric/Behavioral: Negative.     Objective:  Physical Exam Constitutional:      Appearance: She is well-developed.     Comments: Appears frail with temporal wasting and thin  HENT:     Head: Normocephalic and atraumatic.  Cardiovascular:     Rate and  Rhythm: Normal rate and regular rhythm.  Pulmonary:     Effort: Pulmonary effort is normal. No respiratory distress.     Breath sounds: Normal breath sounds. No wheezing or rales.  Abdominal:     General: Bowel sounds are normal. There is no distension.     Palpations: Abdomen is soft.     Tenderness: There is abdominal tenderness. There is no rebound.     Comments: Wound not examined as just wrapped yesterday by surgeon office.   Musculoskeletal:     Cervical back: Normal range of motion.  Skin:    General: Skin is warm and dry.  Neurological:     Mental Status: She is alert and oriented to person, place, and time.     Motor: Weakness present.     Coordination: Coordination abnormal.     Comments: In wheelchair, muscle wasting on exam     Vitals:   09/29/20 1327  BP: 130/70  Pulse: (!) 101  Resp: 18  Temp: 97.7 F (36.5 C)  TempSrc: Oral  SpO2: 92%  Weight: 87 lb 12.8 oz (39.8 kg)  Height: 5' 4.5" (1.638 m)    This visit occurred during the SARS-CoV-2 public health emergency.  Safety protocols were in place, including screening questions prior to the visit, additional usage of staff PPE, and extensive cleaning of exam room while observing appropriate contact time as indicated for disinfecting solutions.   Assessment & Plan:  Visit time 30 minutes in face to face communication with patient and coordination of care, additional 15 minutes spent in  record review, coordination or care, ordering tests, communicating/referring to other healthcare professionals, documenting in medical records all on the same day of the visit for total time 45 minutes spent on the visit.

## 2020-09-29 NOTE — Patient Instructions (Addendum)
We have sent in bactroban ointment to use on the finger twice a day for 1-2 weeks or until better.   We are checking the labs.   We have given you a sample of zenpep which is a pancreatic enzyme. Take 1 pill right when eating a meal with all meals. Within 2-3 days you should know if this will help.   Let us know if this helps with the diarrhea.

## 2020-09-30 ENCOUNTER — Emergency Department (HOSPITAL_COMMUNITY): Payer: Medicare Other

## 2020-09-30 ENCOUNTER — Emergency Department (HOSPITAL_COMMUNITY)
Admission: EM | Admit: 2020-09-30 | Discharge: 2020-09-30 | Disposition: A | Discharge status: Home / Self Care | Payer: Medicare Other | Attending: Emergency Medicine | Admitting: Emergency Medicine

## 2020-09-30 DIAGNOSIS — K5900 Constipation, unspecified: Secondary | ICD-10-CM | POA: Diagnosis not present

## 2020-09-30 DIAGNOSIS — R0602 Shortness of breath: Secondary | ICD-10-CM | POA: Insufficient documentation

## 2020-09-30 DIAGNOSIS — R109 Unspecified abdominal pain: Secondary | ICD-10-CM | POA: Diagnosis not present

## 2020-09-30 DIAGNOSIS — Z4802 Encounter for removal of sutures: Secondary | ICD-10-CM | POA: Diagnosis not present

## 2020-09-30 DIAGNOSIS — T8131XA Disruption of external operation (surgical) wound, not elsewhere classified, initial encounter: Secondary | ICD-10-CM | POA: Diagnosis not present

## 2020-09-30 DIAGNOSIS — R0902 Hypoxemia: Secondary | ICD-10-CM | POA: Diagnosis not present

## 2020-09-30 DIAGNOSIS — G8918 Other acute postprocedural pain: Secondary | ICD-10-CM

## 2020-09-30 DIAGNOSIS — I5032 Chronic diastolic (congestive) heart failure: Secondary | ICD-10-CM | POA: Diagnosis not present

## 2020-09-30 DIAGNOSIS — Z7982 Long term (current) use of aspirin: Secondary | ICD-10-CM | POA: Diagnosis not present

## 2020-09-30 DIAGNOSIS — I2510 Atherosclerotic heart disease of native coronary artery without angina pectoris: Secondary | ICD-10-CM | POA: Insufficient documentation

## 2020-09-30 DIAGNOSIS — R1084 Generalized abdominal pain: Secondary | ICD-10-CM | POA: Diagnosis not present

## 2020-09-30 DIAGNOSIS — R1033 Periumbilical pain: Secondary | ICD-10-CM | POA: Diagnosis present

## 2020-09-30 DIAGNOSIS — M069 Rheumatoid arthritis, unspecified: Secondary | ICD-10-CM | POA: Diagnosis not present

## 2020-09-30 DIAGNOSIS — Z96653 Presence of artificial knee joint, bilateral: Secondary | ICD-10-CM | POA: Insufficient documentation

## 2020-09-30 DIAGNOSIS — E43 Unspecified severe protein-calorie malnutrition: Secondary | ICD-10-CM | POA: Diagnosis not present

## 2020-09-30 DIAGNOSIS — Z87891 Personal history of nicotine dependence: Secondary | ICD-10-CM | POA: Insufficient documentation

## 2020-09-30 DIAGNOSIS — G8928 Other chronic postprocedural pain: Secondary | ICD-10-CM | POA: Diagnosis not present

## 2020-09-30 LAB — CBC WITH DIFFERENTIAL/PLATELET
Abs Immature Granulocytes: 0.04 10*3/uL (ref 0.00–0.07)
Basophils Absolute: 0 10*3/uL (ref 0.0–0.1)
Basophils Relative: 0 %
Eosinophils Absolute: 0.1 10*3/uL (ref 0.0–0.5)
Eosinophils Relative: 1 %
HCT: 40.9 % (ref 36.0–46.0)
Hemoglobin: 12.7 g/dL (ref 12.0–15.0)
Immature Granulocytes: 0 %
Lymphocytes Relative: 46 %
Lymphs Abs: 4.2 10*3/uL — ABNORMAL HIGH (ref 0.7–4.0)
MCH: 30.2 pg (ref 26.0–34.0)
MCHC: 31.1 g/dL (ref 30.0–36.0)
MCV: 97.4 fL (ref 80.0–100.0)
Monocytes Absolute: 0.8 10*3/uL (ref 0.1–1.0)
Monocytes Relative: 9 %
Neutro Abs: 4 10*3/uL (ref 1.7–7.7)
Neutrophils Relative %: 44 %
Platelets: 242 10*3/uL (ref 150–400)
RBC: 4.2 MIL/uL (ref 3.87–5.11)
RDW: 17.4 % — ABNORMAL HIGH (ref 11.5–15.5)
WBC: 9.2 10*3/uL (ref 4.0–10.5)
nRBC: 0 % (ref 0.0–0.2)

## 2020-09-30 LAB — COMPREHENSIVE METABOLIC PANEL
ALT: 21 U/L (ref 0–44)
AST: 27 U/L (ref 15–41)
Albumin: 2.6 g/dL — ABNORMAL LOW (ref 3.5–5.0)
Alkaline Phosphatase: 107 U/L (ref 38–126)
Anion gap: 8 (ref 5–15)
BUN: 8 mg/dL (ref 8–23)
CO2: 27 mmol/L (ref 22–32)
Calcium: 8.6 mg/dL — ABNORMAL LOW (ref 8.9–10.3)
Chloride: 108 mmol/L (ref 98–111)
Creatinine, Ser: 0.44 mg/dL (ref 0.44–1.00)
GFR, Estimated: >60 mL/min (ref >60)
Glucose, Bld: 83 mg/dL (ref 70–99)
Potassium: 3.2 mmol/L — ABNORMAL LOW (ref 3.5–5.1)
Sodium: 143 mmol/L (ref 135–145)
Total Bilirubin: 0.7 mg/dL (ref 0.3–1.2)
Total Protein: 6.1 g/dL — ABNORMAL LOW (ref 6.5–8.1)

## 2020-09-30 LAB — TROPONIN I (HIGH SENSITIVITY)
Troponin I (High Sensitivity): 5 ng/L (ref <18)
Troponin I (High Sensitivity): 5 ng/L (ref <18)

## 2020-09-30 LAB — PROTIME-INR
INR: 1 (ref 0.8–1.2)
Prothrombin Time: 12.5 seconds (ref 11.4–15.2)

## 2020-09-30 LAB — LIPASE, BLOOD: Lipase: 26 U/L (ref 11–51)

## 2020-09-30 LAB — LDL CHOLESTEROL, DIRECT: Direct LDL: 69 mg/dL

## 2020-09-30 LAB — LACTIC ACID, PLASMA: Lactic Acid, Venous: 1.2 mmol/L (ref 0.5–1.9)

## 2020-09-30 MED ORDER — IOHEXOL 9 MG/ML PO SOLN
ORAL | Status: AC
  Filled 2020-09-30: qty 1000

## 2020-09-30 MED ORDER — MORPHINE SULFATE (PF) 4 MG/ML IV SOLN
4.0000 mg | Freq: Once | INTRAVENOUS | Status: AC
  Administered 2020-09-30: 4 mg via INTRAVENOUS
  Filled 2020-09-30: qty 1

## 2020-09-30 MED ORDER — SODIUM CHLORIDE 0.9 % IV BOLUS
500.0000 mL | Freq: Once | INTRAVENOUS | Status: AC
  Administered 2020-09-30: 500 mL via INTRAVENOUS

## 2020-09-30 MED ORDER — IOHEXOL 300 MG/ML  SOLN
75.0000 mL | Freq: Once | INTRAMUSCULAR | Status: AC | PRN
  Administered 2020-09-30: 75 mL via INTRAVENOUS

## 2020-09-30 MED ORDER — IOHEXOL 9 MG/ML PO SOLN
500.0000 mL | ORAL | Status: AC
  Administered 2020-09-30 (×2): 500 mL via ORAL

## 2020-09-30 NOTE — Progress Notes (Signed)
Progress Note     Subjective: Patient known to surgical service after exploratory laparotomy and detorsion of torsed small bowel mesentery 08/23/20. She also had gastrostomy placed at that time. This was removed prior to hospital discharge as patient was eating and having bowel function. Patient presents today on advice of surgeon because she called reporting increased pain to previous gastrostomy site since overnight. She denies fever, chills, nausea or vomiting. She is having bowel function. She reports she is probably not eating as much as she should but taking in nutritional supplements to help. She notices that when she drinks it appears to almost immediately drain out from previous gastrostomy site. Skin is very irritated, she has been using barrier cream prescribed last week when she saw Dr. Zenia Resides in the office but is not seeing a huge difference. Dressings are changed twice daily.   Objective: Vital signs in last 24 hours: Temp:  [98.4 F (36.9 C)] 98.4 F (36.9 C) (03/23 1117) Pulse Rate:  [76-79] 76 (03/23 1351) Resp:  [18-22] 20 (03/23 1351) BP: (113-128)/(72-83) 121/80 (03/23 1351) SpO2:  [93 %-100 %] 93 % (03/23 1351) Weight:  [39.8 kg] 39.8 kg (03/23 1103)    Intake/Output from previous day: No intake/output data recorded. Intake/Output this shift: No intake/output data recorded.  PE: General: pleasant, WD, cachectic female who is laying in bed in NAD HEENT: temporal wasting bilaterally.  Sclera are noninjected.  PERRL.  Ears and nose without any masses or lesions.  Mouth is pink and moist Heart: regular, rate, and rhythm.   Lungs: CTAB, no wheezes, rhonchi, or rales noted.  Respiratory effort nonlabored Abd: soft, appropriately ttp, ND, +BS, small opening from prior gastrostomy that is draining thin clear fluid, significantly excoriated tissue present surrounding this; midline wound open in two places, superiorly iodoform is present and visible suture, inferiorly there  is granulated tissue present with scan thin clear drainage and visible sutures        Lab Results:  Recent Labs    09/29/20 1425 09/30/20 1157  WBC 10.4 9.2  HGB 11.7* 12.7  HCT 35.8* 40.9  PLT 226.0 242   BMET Recent Labs    09/29/20 1425 09/30/20 1133  NA 141 143  K 2.7* 3.2*  CL 105 108  CO2 28 27  GLUCOSE 146* 83  BUN 9 8  CREATININE 0.46 0.44  CALCIUM 8.7 8.6*   PT/INR Recent Labs    09/30/20 1157  LABPROT 12.5  INR 1.0   CMP     Component Value Date/Time   NA 143 09/30/2020 1133   NA 138 04/29/2020 0948   K 3.2 (L) 09/30/2020 1133   CL 108 09/30/2020 1133   CO2 27 09/30/2020 1133   GLUCOSE 83 09/30/2020 1133   BUN 8 09/30/2020 1133   BUN 11 04/29/2020 0948   CREATININE 0.44 09/30/2020 1133   CREATININE 0.74 11/30/2015 1327   CALCIUM 8.6 (L) 09/30/2020 1133   PROT 6.1 (L) 09/30/2020 1133   PROT 6.5 04/29/2020 0948   ALBUMIN 2.6 (L) 09/30/2020 1133   ALBUMIN 3.8 04/29/2020 0948   AST 27 09/30/2020 1133   ALT 21 09/30/2020 1133   ALKPHOS 107 09/30/2020 1133   BILITOT 0.7 09/30/2020 1133   BILITOT 0.7 04/29/2020 0948   GFRNONAA >60 09/30/2020 1133   GFRAA 69 04/29/2020 0948   Lipase     Component Value Date/Time   LIPASE 26 09/30/2020 1133       Studies/Results: No results found.  Anti-infectives: Anti-infectives (  From admission, onward)   None       Assessment/Plan Pulmonary fibrosis/interstitial lung disease Rheumatoid arthritis - cellcept/Remicade/predinsone - prednisone 5mg  qd currently Hx CAD/CHF Hx of DVT  Anemia Severeprotein-caloriemalnutrition - was improving, last prealbumin was 21.4 on 2/21  SBO/Torsion of small bowel mesentary S/pExploratory laparotomy, detorsion of small bowel, placement of Stamm Gastrostomy tube, 08/23/20, Michaelle Birks - patient presented to ED with increased pain in LUQ at prior gastrostomy site - significant excoriation of tissue and what appears to be persistent tract that is  essentially functioning as a gastrocutaneous fistula - will ask WOC to see once we figure out if we want to try to occlude tract vs just pouch to control drainage - no leukocytosis, CMET and CT scan pending  - midline wound with granulation present and visible fascial sutures    LOS: 0 days    Norm Parcel , [facility] Surgery 09/30/2020, 2:01 PM Please see Amion for pager number during day hours 7:00am-4:30pm   Will follow up ct scan I think pouching the g tube site for now and letting her skin get better is reasonable I dont think needs any operative therapy for this at this point. We will follow

## 2020-09-30 NOTE — ED Notes (Signed)
Pt reports 'bubbling' sensation from mid epigastric area to lower left abdomen. Confirmed with CT to continue drinking contrast.

## 2020-09-30 NOTE — ED Notes (Signed)
Pt took liquid tylenol and usual pain medicine around 8am. Unable to recall exact name

## 2020-09-30 NOTE — ED Provider Notes (Signed)
Patient seen by Dr. Melina Copa.  Please see his notes.  CT scan does not show any acute finding.  General surgery has evaluated patient.  No new changes recommended.  Continue outpatient wound care.   Dorie Rank, MD 09/30/20 [phone]

## 2020-09-30 NOTE — ED Provider Notes (Signed)
[facility] DEPT Provider Note   CSN: [id number] Arrival date & time: 09/30/20  1050     History Chief Complaint  Patient presents with  . Wound Check  . Abdominal Pain    [name] [name] is a 87 y.o. female.  She has a history of interstitial lung disease and is on oxygen.  She had a small bowel obstruction and torsion of the small bowel mesentery last month and had abdominal surgery by Dr. Zenia Resides.  She has been slow to heal and still has open wounds that have been getting repacked twice a day.  She is taking some hydrocodone as needed for pain.  Starting last evening she had more excruciating pain at her incisions.  Rates it as 9 out of 10.  The visiting nurse called the surgical office today she was instructed to come to the emergency department for evaluation.  The history is provided by the patient.  Abdominal Pain Pain location:  Periumbilical Pain quality: stabbing   Pain severity:  Severe Onset quality:  Gradual Duration:  2 days Timing:  Constant Progression:  Unchanged Chronicity:  New Relieved by:  Nothing Worsened by:  Position changes Ineffective treatments: narcotics. Associated symptoms: chest pain, constipation and shortness of breath (baseline)   Associated symptoms: no diarrhea, no dysuria, no fever and no sore throat        Past Medical History:  Diagnosis Date  . Allergic rhinitis   . Allergy   . CAD (coronary artery disease)    Mild CAD by cath 2008  . CHF (congestive heart failure) ([place])   . DJD (degenerative joint disease)    rheumatoid  . DVT (deep venous thrombosis) ([place])   . Dyslipidemia   . History of echocardiogram    Echo 12/2018: EF 60-65, mod asymmetric LVH, Gr 1 DD  . History of nuclear stress test    Myoview 5/17:  EF 74%, normal perfusion, low risk // Myoview 12/2018:  EF 89, very mild ischemia in inferoapical wall; Low Risk  . History of pulmonary embolism   . Hyperlipidemia   . Insomnia   . Osteoporosis    . Psoriasis   . Pulmonary fibrosis ([place])   . Rheumatoid arthritis [facility]     Patient Active Problem List   Diagnosis Date Noted  . Pressure injury of skin 09/06/2020  . Acute on chronic respiratory failure with hypoxemia ([place]) 08/23/2020  . Hypotension after procedure 08/23/2020  . Hypernatremia 08/23/2020  . Protein-calorie malnutrition, severe 08/20/2020  . SBO (small bowel obstruction) ([place]) 08/17/2020  . Fall 01/25/2020  . Other fatigue 01/03/2020  . Left shoulder pain 01/03/2020  . Urinary frequency 01/03/2020  . Acute otalgia, left 09/06/2019  . Unintentional weight loss 07/18/2019  . Personal history of PE (pulmonary embolism) 04/23/2019  . Headache 02/11/2019  . Iron deficiency anemia 12/21/2018  . Cold sore 10/23/2018  . Blood in stool 09/14/2018  . 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 Therapeutic drug monitoring 07/17/2018  . Chronic diastolic CHF (congestive heart failure) ([place]) 12/19/2017  . Rash 08/11/2017  . Chronic respiratory failure with hypoxia ([place]) 03/30/2017  . Angular cheilitis 03/08/2017  . Leg pain 10/25/2016  . Back pain 08/05/2016  . RA (rheumatoid arthritis) ([place]) 03/31/2015  . Allergic rhinitis   . Varicose vein 09/11/2014  . ILD (interstitial lung disease) ([place]) 06/25/2012  . Chest pain 02/27/2012  . CAD (coronary artery disease) 02/27/2012  . Pruritus 08/18/2009  . Postinflammatory pulmonary fibrosis / RA ILD  06/30/2008  .  Constipation 01/03/2008  . Dyslipidemia 06/16/2007  . Personal history of DVT (deep vein thrombosis) 06/16/2007    Past Surgical History:  Procedure Laterality Date  . ABDOMINAL HYSTERECTOMY  1974  . APPENDECTOMY  1959  . CATARACT EXTRACTION    . LAPAROTOMY N/A 08/23/2020   Procedure: EXPLORATORY LAPAROTOMY small bowel resection gastrostomy tube placement;  Surgeon: Dwan Bolt, MD;  Location: [facility]S;  Service: General;  Laterality: N/A;  . LEFT HEART CATHETERIZATION WITH CORONARY ANGIOGRAM N/A 06/18/2013    Procedure: LEFT HEART CATHETERIZATION WITH CORONARY ANGIOGRAM;  Surgeon: Peter M Martinique, MD;  Location: [facility] CATH LAB;  Service: Cardiovascular;  Laterality: N/A;  . TONSILLECTOMY  1941, 1951  . TOTAL KNEE ARTHROPLASTY  1997, 2007   Bilateral     OB History    Gravida  4   Para  2   Term  2   Preterm      AB      Living  0     SAB      IAB      Ectopic      Multiple      Live Births              Family History  Problem Relation Age of Onset  . Kidney disease Daughter 5  . Cancer Daughter   . Heart attack Father 74  . Alzheimer's disease Sister   . Heart disease Sister   . Alzheimer's disease Sister     Social History   Tobacco Use  . Smoking status: Former Smoker    Packs/day: 0.50    Years: 20.00    Pack years: 10.00    Types: Cigarettes    Quit date: 07/11/1973    Years since quitting: 47.2  . Smokeless tobacco: Never Used  Vaping Use  . Vaping Use: Never used  Substance Use Topics  . Alcohol use: No  . Drug use: No    Home Medications Prior to Admission medications   Medication Sig Start Date End Date Taking? Authorizing Provider  famotidine (PEPCID) 20 MG tablet Take 1 tablet (20 mg total) by mouth daily. 09/08/20   Hosie Poisson, MD  fluticasone (FLONASE) 50 MCG/ACT nasal spray Place 2 sprays into both nostrils daily as needed for allergies or rhinitis. 03/17/15   Hoyt Koch, MD  furosemide (LASIX) 20 MG tablet Take 1 tablet by mouth daily as needed for swelling. Patient taking differently: Take 20 mg by mouth daily as needed for fluid or edema. 12/11/18   Burtis Junes, NP  HYDROcodone-acetaminophen (NORCO/VICODIN) 5-325 MG tablet Take by mouth 2 (two) times a week. 09/23/20   [provider]  InFLIXimab (REMICADE IV) Inject into the vein as directed. Every 2 months    [provider]  mupirocin ointment (BACTROBAN) 2 % Apply 1 application topically 2 (two) times daily. 09/29/20   Hoyt Koch, MD   mycophenolate (CELLCEPT) 500 MG tablet 2 tabs bid Patient taking differently: Take 1,000 mg by mouth 2 (two) times daily. 01/27/20   Charlynne Cousins, MD  Nintedanib (OFEV) 100 MG CAPS Take 1 capsule (100 mg total) by mouth 2 (two) times daily. 08/18/20   Mannam, Hart Robinsons, MD  nitroGLYCERIN (NITROSTAT) 0.4 MG SL tablet Place 1 tablet (0.4 mg total) under the tongue every 5 (five) minutes as needed for chest pain. 06/29/20 09/27/20  Burnell Blanks, MD  Nutritional Supplements (,FEEDING SUPPLEMENT, PROSOURCE PLUS) liquid Take 30 mLs by mouth 2 (two) times daily  between meals. 09/07/20   Hosie Poisson, MD  potassium chloride (KLOR-CON) 10 MEQ tablet Take 20 Meq twice daily x 2 days and then 20 Meq daily 09/29/20   Burns, Claudina Lick, MD  predniSONE (DELTASONE) 5 MG tablet TAKE 1 TABLET DAILY WITH BREAKFAST Patient taking differently: Take 5 mg by mouth daily with breakfast. 05/08/20   Mannam, Hart Robinsons, MD    Allergies    Crestor [rosuvastatin], Lactose intolerance (gi), Penicillins, and Imuran [azathioprine]  Review of Systems   Review of Systems  Constitutional: Negative for fever.  HENT: Negative for sore throat.   Eyes: Negative for visual disturbance.  Respiratory: Positive for shortness of breath (baseline).   Cardiovascular: Positive for chest pain.  Gastrointestinal: Positive for abdominal pain and constipation. Negative for diarrhea.  Genitourinary: Negative for dysuria.  Musculoskeletal: Negative for neck pain.  Skin: Positive for wound.  Neurological: Negative for headaches.    Physical Exam Updated Vital Signs BP 113/78 (BP Location: Left Arm)   Pulse 79   Temp 98.4 F (36.9 C) (Oral)   Resp 18   Ht 5' 4.5" (1.638 m)   Wt 39.8 kg   SpO2 97%   BMI 14.84 kg/m   Physical Exam Vitals and nursing note reviewed.  Constitutional:      General: She is not in acute distress.    Appearance: She is well-developed. She is cachectic.  HENT:     Head: Normocephalic and  atraumatic.  Eyes:     Conjunctiva/sclera: Conjunctivae normal.  Cardiovascular:     Rate and Rhythm: Normal rate and regular rhythm.     Heart sounds: No murmur heard.   Pulmonary:     Effort: Pulmonary effort is normal. No respiratory distress.     Breath sounds: Normal breath sounds.  Abdominal:     Palpations: Abdomen is soft.     Tenderness: There is abdominal tenderness. There is no guarding or rebound.     Comments: She has some generalized tenderness mostly around her abdominal incisions.  There is a ventral open wound midline packed.  There is also a small wound that is packed.  Laterally to the left there is some macerated skin in a skin fold with some serous drainage  Musculoskeletal:        General: No deformity or signs of injury. Normal range of motion.     Cervical back: Neck supple.  Skin:    General: Skin is warm and dry.     Capillary Refill: Capillary refill takes less than 2 seconds.  Neurological:     General: No focal deficit present.     Mental Status: She is alert.       ED Results / Procedures / Treatments   Labs (all labs ordered are listed, but only abnormal results are displayed) Labs Reviewed  CBC WITH DIFFERENTIAL/PLATELET - Abnormal; Notable for the following components:      Result Value   RDW 17.4 (*)    Lymphs Abs 4.2 (*)    All other components within normal limits  COMPREHENSIVE METABOLIC PANEL - Abnormal; Notable for the following components:   Potassium 3.2 (*)    Calcium 8.6 (*)    Total Protein 6.1 (*)    Albumin 2.6 (*)    All other components within normal limits  PROTIME-INR  LACTIC ACID, PLASMA  LIPASE, BLOOD  URINALYSIS, ROUTINE W REFLEX MICROSCOPIC  TROPONIN I (HIGH SENSITIVITY)  TROPONIN I (HIGH SENSITIVITY)    EKG EKG Interpretation  Date/Time:  Wednesday  September 30 2020 12:56:04 EDT Ventricular Rate:  76 PR Interval:    QRS Duration: 86 QT Interval:  385 QTC Calculation: 433 R Axis:   50 Text  Interpretation: Sinus rhythm Probable left atrial enlargement Borderline low voltage, extremity leads RSR' in V1 or V2, right VCD or RVH No significant change since prior 2/22 Confirmed by Aletta Edouard [phone]) on 09/30/2020 1:02:21 PM   Radiology CT Abdomen Pelvis W Contrast  Result Date: 09/30/2020 CLINICAL DATA:  Abdominal abscess, progressive abdominal pain, anemia, malnutrition EXAM: CT ABDOMEN AND PELVIS WITH CONTRAST TECHNIQUE: Multidetector CT imaging of the abdomen and pelvis was performed using the standard protocol following bolus administration of intravenous contrast. CONTRAST:  69mL OMNIPAQUE IOHEXOL 300 MG/ML  SOLN COMPARISON:  None. FINDINGS: Lower chest: Bibasilar pulmonary fibrotic changes are identified, grossly unchanged. No superimposed focal pulmonary infiltrates or nodules. Moderate multi-vessel coronary artery calcification. Global cardiac size within normal limits. No pericardial effusion. Hepatobiliary: Moderate intrahepatic and marked extrahepatic biliary ductal dilation is again identified, similar prior examination, with dilation of the extrahepatic bile duct at the level of the ampulla. The extrahepatic bile duct measures up to 23 mm in diameter. The gallbladder is distended, similar to prior examination, extending into the right iliac fossa. No pericholecystic inflammatory changes identified. The liver is otherwise unremarkable. Pancreas: Unremarkable.  The pancreatic duct is not dilated. Spleen: Unremarkable Adrenals/Urinary Tract: Adrenal glands are unremarkable. Kidneys are normal, without renal calculi, focal lesion, or hydronephrosis. Bladder is unremarkable. Stomach/Bowel: Previously noted balloon retention gastrostomy has been removed. There is leakage of contrast through the gastrostomy catheter tract in keeping with patency of the a tract. Moderate descending and sigmoid colonic diverticulosis. Scattered diverticular are noted throughout the remainder of the colon. The  stomach, small bowel, and large bowel are otherwise unremarkable. No evidence of obstruction or focal inflammation. No free intraperitoneal gas or fluid. There is superficial dehiscence of the laparotomy incision, however, the deep abdominal fascia, while attenuated, appears intact. Vascular/Lymphatic: Moderate aortoiliac atherosclerotic calcification. No aortic aneurysm. No pathologic adenopathy within the abdomen and pelvis. Reproductive: Status post hysterectomy. No adnexal masses. Other: There is diffuse body wall subcutaneous edema in keeping with mild anasarca as well as wasting of the body wall. Rectum unremarkable. Musculoskeletal: Osseous structures are age-appropriate. No acute bone abnormality. No lytic or blastic bone lesions are identified. IMPRESSION: No acute intra-abdominal pathology identified. Persistent intra and extrahepatic biliary ductal dilation and gallbladder distension suggestive of a distal obstructing process in the region of the ampulla. Correlation with liver enzymes is recommended. Interval removal of the balloon retention gastrostomy with leakage of contrast along the tract in keeping with patency of the tract. Superficial dehiscence of the laparotomy incision. The deep abdominal fascia, while attenuated, appears intact. Aortic Atherosclerosis (ICD10-I70.0). Electronically Signed   By: Fidela Salisbury MD   On: 09/30/2020 16:13    Procedures Procedures   Medications Ordered in ED Medications  iohexol (OMNIPAQUE) 9 MG/ML oral solution (has no administration in time range)  morphine 4 MG/ML injection 4 mg (4 mg Intravenous Given 09/30/20 1226)  sodium chloride 0.9 % bolus 500 mL (0 mLs Intravenous Stopped 09/30/20 1435)  iohexol (OMNIPAQUE) 9 MG/ML oral solution 500 mL (500 mLs Oral Contrast Given 09/30/20 1359)  morphine 4 MG/ML injection 4 mg (4 mg Intravenous Given 09/30/20 1435)  iohexol (OMNIPAQUE) 300 MG/ML solution 75 mL (75 mLs Intravenous Contrast Given 09/30/20 1526)     ED Course  I have reviewed the triage vital signs and the nursing  notes.  Pertinent labs & imaging results that were available during my care of the patient were reviewed by me and considered in my medical decision making (see chart for details).  Clinical Course as of 09/30/20 1727  Wed Sep 30, 2020  1221 Discussed with Dr. Donne Hazel General Surg who recommends the patient getting a p.o. and IV contrast CT of his abdomen and pelvis. [MB]    Clinical Course User Index [MB] Hayden Rasmussen, MD   MDM Rules/Calculators/A&P                         This patient complains of abdominal pain; this involves an extensive number of treatment Options and is a complaint that carries with it a high risk of complications and Morbidity. The differential includes obstruction, dehiscence, intra-abdominal abscess, fistula, pancreatitis  I ordered, reviewed and interpreted labs, which included CBC with normal white count normal hemoglobin, chemistries fairly normal other than low potassium chronic troponins flat, lactic acid not elevated I ordered medication IV fluids IV pain medicine I ordered imaging studies which included CT abdomen and pelvis which was pending at time of signout to oncoming provider Dr. Tomi Bamberger Additional history obtained from patient's sister Previous records obtained and reviewed in epic including prior operative notes by Dr. Zenia Resides  I consulted Dr. Donne Hazel general surgery and discussed lab and imaging findings  Critical Interventions: None  After the interventions stated above, I reevaluated the patient and found patient's pain to be controlled.  She understands she is pending a CAT scan.  Patient's care signed out to oncoming provider Dr. Tomi Bamberger to follow-up on CT results along with general surgery recommendations.   Final Clinical Impression(s) / ED Diagnoses Final diagnoses:  Post-operative pain    Rx / DC Orders ED Discharge Orders    None       Hayden Rasmussen, MD 09/30/20 1730

## 2020-09-30 NOTE — Discharge Instructions (Addendum)
Continue with the recommendations by the wound care team.  You can drain the drainage bag as frequently as you need as we discussed.  Follow up with your surgeon as planned

## 2020-09-30 NOTE — ED Triage Notes (Signed)
Pt BIB [facility] from home for c/o increased pain from abdominal wound starting last night. Surgery 08/19/20. Unable to tolerate wound cleaning this am, PCP recommended to come to ED for pain management. No increase in drainage.  BP 110/64 HR 80 SpO2 98% RA Temp 98.9

## 2020-09-30 NOTE — Consult Note (Signed)
[facility] Nurse Consult Note: Reason for Consult:LMQ gastrostomy tube site (no longer has tube) leaking onto skin. Requested to see by Dr. Donne Hazel and [facility] PA-C K. Johnson for assistance in managing effluent. Wound type: Irritant Contact Dermatitis  [id number] - Related to digestive stoma or fistula  Also, two midline wounds, full thickness  Pressure Injury POA: NA Measurement: G-tube site in deep crease measures <.08 round.  ICD related to digestive stoma contributed to partial thickness skin loss in an area measuring 12cm x 12cm. Superior wound measures 2cm x 1.5cm x 0.6cm  Distal wound measures 6cm x 3.4cnm x 1cm Wound bed: slick, pink nongranulating with clear suture material protruding from wounds. Drainage (amount, consistency, odor) small to moderate from abdominal wounds at midline. Moderate green succus from gastrostomy site Periwound:As described above Dressing procedure/placement/frequency:Orders provided for Nursing to fill midline defects with saline moistened gauze at least twice daily.  An Eakin fistula pouch is affixed to the skin around the gastrostomy site after treating the ICD with stoma powder and skin protectant. Patient tolerated reasonably well, but was uncomfortable during treatment. A seal is achieved on the pouch prior to patient going to CT.  WOC nursing team will not follow, but will remain available to this patient, the nursing and medical teams.  Please re-consult if needed. Thanks, Maudie Flakes, MSN, RN, [place], Arther Abbott  Pager# [phone]

## 2020-10-01 ENCOUNTER — Telehealth: Payer: Self-pay | Admitting: Internal Medicine

## 2020-10-01 DIAGNOSIS — T8131XA Disruption of external operation (surgical) wound, not elsewhere classified, initial encounter: Secondary | ICD-10-CM | POA: Diagnosis not present

## 2020-10-01 DIAGNOSIS — I5032 Chronic diastolic (congestive) heart failure: Secondary | ICD-10-CM | POA: Diagnosis not present

## 2020-10-01 DIAGNOSIS — E43 Unspecified severe protein-calorie malnutrition: Secondary | ICD-10-CM | POA: Diagnosis not present

## 2020-10-01 DIAGNOSIS — M069 Rheumatoid arthritis, unspecified: Secondary | ICD-10-CM | POA: Diagnosis not present

## 2020-10-01 DIAGNOSIS — Z7982 Long term (current) use of aspirin: Secondary | ICD-10-CM | POA: Diagnosis not present

## 2020-10-01 DIAGNOSIS — I2510 Atherosclerotic heart disease of native coronary artery without angina pectoris: Secondary | ICD-10-CM | POA: Diagnosis not present

## 2020-10-01 NOTE — Telephone Encounter (Signed)
Fine for verbals °

## 2020-10-01 NOTE — Telephone Encounter (Signed)
[name] [name] with [facility] calling, states the patient missed a visit today. She also needs verbal orders for OT for 1 week 1 and 2 week 4.  [phone]

## 2020-10-01 NOTE — Telephone Encounter (Signed)
See below

## 2020-10-02 ENCOUNTER — Other Ambulatory Visit: Payer: Self-pay | Admitting: Internal Medicine

## 2020-10-02 ENCOUNTER — Telehealth: Payer: Self-pay | Admitting: Internal Medicine

## 2020-10-02 DIAGNOSIS — R197 Diarrhea, unspecified: Secondary | ICD-10-CM | POA: Insufficient documentation

## 2020-10-02 DIAGNOSIS — T8131XA Disruption of external operation (surgical) wound, not elsewhere classified, initial encounter: Secondary | ICD-10-CM | POA: Diagnosis not present

## 2020-10-02 DIAGNOSIS — E43 Unspecified severe protein-calorie malnutrition: Secondary | ICD-10-CM | POA: Diagnosis not present

## 2020-10-02 DIAGNOSIS — Z7982 Long term (current) use of aspirin: Secondary | ICD-10-CM | POA: Diagnosis not present

## 2020-10-02 DIAGNOSIS — M069 Rheumatoid arthritis, unspecified: Secondary | ICD-10-CM | POA: Diagnosis not present

## 2020-10-02 DIAGNOSIS — I5032 Chronic diastolic (congestive) heart failure: Secondary | ICD-10-CM | POA: Diagnosis not present

## 2020-10-02 DIAGNOSIS — I2510 Atherosclerotic heart disease of native coronary artery without angina pectoris: Secondary | ICD-10-CM | POA: Diagnosis not present

## 2020-10-02 DIAGNOSIS — E876 Hypokalemia: Secondary | ICD-10-CM

## 2020-10-02 NOTE — Assessment & Plan Note (Signed)
Could be related to malabsorption with recent surgical SBO management with resection of small bowel. Given sample zenpep to see if this helps.

## 2020-10-02 NOTE — Telephone Encounter (Signed)
Okay for verbals. Juven would be good for her.

## 2020-10-02 NOTE — Assessment & Plan Note (Signed)
Appears with temporal wasting today. Given sample of zenpep to use with meals to see if this can help the diarrhea. She has recently had small bowel resection and we discussed that this can be months sometimes before that is calmed and healed well on the inside. She is doing meal supplements and this is helping some.

## 2020-10-02 NOTE — Assessment & Plan Note (Signed)
Weight is down significantly due to recent severe illness and we talked about timeline for recovery being months rather than weeks and not to get frustrated with her progress being slow.

## 2020-10-02 NOTE — Telephone Encounter (Signed)
See below

## 2020-10-02 NOTE — Telephone Encounter (Signed)
Anissa from [facility] calling, states during todays visit she found a bed sore on her right buttock. She is requesting verbals for calcium alginate three times a week. She is also wondering if Dr. Sharlet Salina thought the patient would benefit from the supplement juven, if so she would tell the family about it and get them to pick it up for her. Anissa- [phone] Okay to lvm

## 2020-10-02 NOTE — Assessment & Plan Note (Signed)
No flare today although she is late for remicade due to illness. She will follow up with rheumatology for timing of this. On chronic prednisone as well.

## 2020-10-02 NOTE — Telephone Encounter (Signed)
LVM giving Holly verbal orders for OT. Office number was provided in case she has any additional questions or concerns.

## 2020-10-02 NOTE — Assessment & Plan Note (Signed)
Does not appear to be in flare today. 

## 2020-10-02 NOTE — Telephone Encounter (Signed)
LVM giving Anissa verbal orders. Office number was provided in case she has any additional questions or concerns.

## 2020-10-05 ENCOUNTER — Other Ambulatory Visit: Payer: Self-pay | Admitting: *Deleted

## 2020-10-05 ENCOUNTER — Telehealth: Payer: Self-pay | Admitting: *Deleted

## 2020-10-05 ENCOUNTER — Telehealth: Payer: Self-pay | Admitting: Internal Medicine

## 2020-10-05 DIAGNOSIS — I5032 Chronic diastolic (congestive) heart failure: Secondary | ICD-10-CM | POA: Diagnosis not present

## 2020-10-05 DIAGNOSIS — I2510 Atherosclerotic heart disease of native coronary artery without angina pectoris: Secondary | ICD-10-CM | POA: Diagnosis not present

## 2020-10-05 DIAGNOSIS — Z7982 Long term (current) use of aspirin: Secondary | ICD-10-CM | POA: Diagnosis not present

## 2020-10-05 DIAGNOSIS — J849 Interstitial pulmonary disease, unspecified: Secondary | ICD-10-CM

## 2020-10-05 DIAGNOSIS — E43 Unspecified severe protein-calorie malnutrition: Secondary | ICD-10-CM | POA: Diagnosis not present

## 2020-10-05 DIAGNOSIS — T8131XA Disruption of external operation (surgical) wound, not elsewhere classified, initial encounter: Secondary | ICD-10-CM | POA: Diagnosis not present

## 2020-10-05 DIAGNOSIS — M069 Rheumatoid arthritis, unspecified: Secondary | ICD-10-CM | POA: Diagnosis not present

## 2020-10-05 NOTE — Telephone Encounter (Signed)
Surgeon is prescribing they should contact them for refill and management. If they are unable to do this they can call us back.

## 2020-10-05 NOTE — Telephone Encounter (Signed)
[name] [name] w/ [facility] called and was wondering if something stronger than HYDROcodone-acetaminophen (NORCO/VICODIN) 5-325 MG tablet could be called in. She said the patient will take it an hour before she see her for wound care and she can barley touch the patient. She said if by chance something stronger could not be called in then a refill can be sent to [facility], [place] - [address] AT [address]. Please advise

## 2020-10-05 NOTE — Patient Outreach (Addendum)
[facility] Post- Acute Care Coordinator follow up.   Received update from [facility] indicating Mrs. [name] [name] has yet to engage with them despite [facility]  multiple attempts.   Mrs. [name] [name] transitioned from [facility] on 09/18/20. She has [facility] home health.  Mrs. [name] [name] later presented to ED on 09/30/20.   Since Mrs. [name] [name] has yet to engage with [facility], writer will make referral to [facility] care coordination team.   Member has high risk for readmission. Has medical history of CHF, CAD, HLD, sacral wound, interstitial lung disease.   Marthenia Rolling, MSN, RN,BSN [facility] Acute Care Coordinator [phone] [facility]) [phone]  (Toll free office)

## 2020-10-05 NOTE — Telephone Encounter (Signed)
See below

## 2020-10-05 NOTE — Chronic Care Management (AMB) (Deleted)
  Chronic Care Management   Note  10/05/2020 Name: LLESENIA FOGAL MRN: [id number] DOB: 03/03/1931  ARMINTA GAMM is a 87 y.o. year old female who is a primary care patient of Hoyt Koch, MD. I reached out to [name] by phone today in response to a referral sent by Ms. Abbey Chatters Mac's PCP, Hoyt Koch, MD     Ms. Melikian was given information about Chronic Care Management services today including:  1. CCM service includes personalized support from designated clinical staff supervised by her physician, including individualized plan of care and coordination with other care providers 2. 24/7 contact phone numbers for assistance for urgent and routine care needs. 3. Service will only be billed when office clinical staff spend 20 minutes or more in a month to coordinate care. 4. Only one practitioner may furnish and bill the service in a calendar month. 5. The patient may stop CCM services at any time (effective at the end of the month) by phone call to the office staff. 6. The patient will be responsible for cost sharing (co-pay) of up to 20% of the service fee (after annual deductible is met).  Patient agreed to services and verbal consent obtained.   Follow up plan: Telephone appointment with care management team member scheduled for:10/08/2020  [facility]

## 2020-10-06 DIAGNOSIS — M069 Rheumatoid arthritis, unspecified: Secondary | ICD-10-CM | POA: Diagnosis not present

## 2020-10-06 DIAGNOSIS — I5032 Chronic diastolic (congestive) heart failure: Secondary | ICD-10-CM | POA: Diagnosis not present

## 2020-10-06 DIAGNOSIS — I2510 Atherosclerotic heart disease of native coronary artery without angina pectoris: Secondary | ICD-10-CM | POA: Diagnosis not present

## 2020-10-06 DIAGNOSIS — Z7982 Long term (current) use of aspirin: Secondary | ICD-10-CM | POA: Diagnosis not present

## 2020-10-06 DIAGNOSIS — E43 Unspecified severe protein-calorie malnutrition: Secondary | ICD-10-CM | POA: Diagnosis not present

## 2020-10-06 DIAGNOSIS — T8131XA Disruption of external operation (surgical) wound, not elsewhere classified, initial encounter: Secondary | ICD-10-CM | POA: Diagnosis not present

## 2020-10-06 NOTE — Telephone Encounter (Signed)
Unable to get in contact with the patient. LDVM with instructions from Dr. Sharlet Salina. Office number was provided in case she has additional questions or concerns.

## 2020-10-08 ENCOUNTER — Telehealth: Payer: Self-pay | Admitting: *Deleted

## 2020-10-08 ENCOUNTER — Telehealth: Payer: Medicare Other

## 2020-10-08 NOTE — Chronic Care Management (AMB) (Signed)
  Chronic Care Management   Note  10/08/2020 Name: [name] [name] MRN: [id number] DOB: 03/18/1931  [name] [name] is a 87 y.o. year old female who is a primary care patient of Hoyt Koch, MD. I reached out to [name] by phone today in response to a referral sent by [name]'s PCP, Dr. Sharlet Salina.      [name] was given information about Chronic Care Management services today including:  1. CCM service includes personalized support from designated clinical staff supervised by her physician, including individualized plan of care and coordination with other care providers 2. 24/7 contact phone numbers for assistance for urgent and routine care needs. 3. Service will only be billed when office clinical staff spend 20 minutes or more in a month to coordinate care. 4. Only one practitioner may furnish and bill the service in a calendar month. 5. The patient may stop CCM services at any time (effective at the end of the month) by phone call to the office staff. 6. The patient will be responsible for cost sharing (co-pay) of up to 20% of the service fee (after annual deductible is met).  Patient agreed to services and verbal consent obtained.   Follow up plan: Telephone appointment with care management team member scheduled for:10/14/2020  [place] [facility]

## 2020-10-09 ENCOUNTER — Telehealth: Payer: Self-pay | Admitting: Internal Medicine

## 2020-10-09 DIAGNOSIS — Z7982 Long term (current) use of aspirin: Secondary | ICD-10-CM | POA: Diagnosis not present

## 2020-10-09 DIAGNOSIS — M069 Rheumatoid arthritis, unspecified: Secondary | ICD-10-CM | POA: Diagnosis not present

## 2020-10-09 DIAGNOSIS — E43 Unspecified severe protein-calorie malnutrition: Secondary | ICD-10-CM | POA: Diagnosis not present

## 2020-10-09 DIAGNOSIS — I2510 Atherosclerotic heart disease of native coronary artery without angina pectoris: Secondary | ICD-10-CM | POA: Diagnosis not present

## 2020-10-09 DIAGNOSIS — I5032 Chronic diastolic (congestive) heart failure: Secondary | ICD-10-CM | POA: Diagnosis not present

## 2020-10-09 DIAGNOSIS — T8131XA Disruption of external operation (surgical) wound, not elsewhere classified, initial encounter: Secondary | ICD-10-CM | POA: Diagnosis not present

## 2020-10-09 MED ORDER — PANCRELIPASE (LIP-PROT-AMYL) 36000-114000 UNITS PO CPEP: 36000.0000 [IU] | ORAL_CAPSULE | Freq: Three times a day (TID) | ORAL | 11 refills | Status: AC

## 2020-10-09 NOTE — Telephone Encounter (Signed)
Unable to get in contact with Anissa. LDVM with Dr. Charlynne Cousins instructions.

## 2020-10-09 NOTE — Telephone Encounter (Signed)
[name] [name] w/ [facility] called and said that the family liked Zenpep and would like a prescription called in. Please advise

## 2020-10-09 NOTE — Telephone Encounter (Signed)
See below

## 2020-10-09 NOTE — Telephone Encounter (Signed)
I sent in creon which is another brand of the same thing which appears to be better covered than zenpep. Still the same 1 pill with meals.

## 2020-10-12 ENCOUNTER — Telehealth: Payer: Self-pay | Admitting: Cardiovascular Disease

## 2020-10-12 ENCOUNTER — Telehealth: Payer: Self-pay | Admitting: Internal Medicine

## 2020-10-12 DIAGNOSIS — Z7982 Long term (current) use of aspirin: Secondary | ICD-10-CM | POA: Diagnosis not present

## 2020-10-12 DIAGNOSIS — T8131XA Disruption of external operation (surgical) wound, not elsewhere classified, initial encounter: Secondary | ICD-10-CM | POA: Diagnosis not present

## 2020-10-12 DIAGNOSIS — I2510 Atherosclerotic heart disease of native coronary artery without angina pectoris: Secondary | ICD-10-CM | POA: Diagnosis not present

## 2020-10-12 DIAGNOSIS — E43 Unspecified severe protein-calorie malnutrition: Secondary | ICD-10-CM | POA: Diagnosis not present

## 2020-10-12 DIAGNOSIS — I5032 Chronic diastolic (congestive) heart failure: Secondary | ICD-10-CM | POA: Diagnosis not present

## 2020-10-12 DIAGNOSIS — M069 Rheumatoid arthritis, unspecified: Secondary | ICD-10-CM | POA: Diagnosis not present

## 2020-10-12 NOTE — Telephone Encounter (Signed)
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 Zenia Resides. Her wounds are slow-healing and she has nurses come out to change dressings.   She calls the office today with complaints of ABD pain and intermittent chest discomfort. Her abdominal pain has gotten progressively worse and "seems to be going up her stomach and chest." She thinks her incision is looking worse. She has spoken with her surgeon and the nurse who changes her dressings and she said they do not seem worried about her pain. She denies fever and other signs of infection.  Upon further discussion, [name] [name] says her SOB may be a little worse too. She has ILD and is on home O2.   She recognizes she is a patient of Dr. Angelena Form but requests a call from Dr. Tamala Julian.  Dr. Tamala Julian notified.

## 2020-10-12 NOTE — Telephone Encounter (Signed)
Patient is requesting to speak with Dr. Thompson Caul nurse. She is aware she is established with Dr. Angelena Form, but she requested Dr. Tamala Julian because she states he attends the same church as her. She states for the past 3 weeks she has been having  stomach pain and intermittent chest pain that is becoming progressively worse. She is currently experiencing the chest pain, but she states it's mild and her main concern is the stomach pain.  Pt c/o of Chest Pain: STAT if CP now or developed within 24 hours  1. Are you having CP right now? Yes    2. Are you experiencing any other symptoms (ex. SOB, nausea, vomiting, sweating)? Stomach pain   3. How long have you been experiencing CP? About 3 weeks, per patient  4. Is your CP continuous or coming and going? Continuous   5. Have you taken Nitroglycerin? No  ?

## 2020-10-12 NOTE — Telephone Encounter (Signed)
See below

## 2020-10-12 NOTE — Telephone Encounter (Signed)
Contacted triage for transfer. Covering RN states she will call the patient right back. Made patient aware. No further questions.

## 2020-10-12 NOTE — Telephone Encounter (Signed)
    [name] [name] from [facility] calling to request verbal order to hold PT services for now  Patient unable to participate in PT at this time due to recent abdominal surgery   Phone [phone]

## 2020-10-13 NOTE — Telephone Encounter (Signed)
Patients family calling, requesting zenpep be sent in for the patient.  [facility] [place], [place] AT [facility] [address] [place] Phone:  [phone]  Fax:  [phone]

## 2020-10-13 NOTE — Telephone Encounter (Signed)
Unable to get in contact with the patient. LVM asking the patient to give our office a call back. Office number was provided.

## 2020-10-14 ENCOUNTER — Telehealth: Payer: Medicare Other

## 2020-10-15 DIAGNOSIS — E43 Unspecified severe protein-calorie malnutrition: Secondary | ICD-10-CM | POA: Diagnosis not present

## 2020-10-15 DIAGNOSIS — I2510 Atherosclerotic heart disease of native coronary artery without angina pectoris: Secondary | ICD-10-CM | POA: Diagnosis not present

## 2020-10-15 DIAGNOSIS — I5032 Chronic diastolic (congestive) heart failure: Secondary | ICD-10-CM | POA: Diagnosis not present

## 2020-10-15 DIAGNOSIS — M069 Rheumatoid arthritis, unspecified: Secondary | ICD-10-CM | POA: Diagnosis not present

## 2020-10-15 DIAGNOSIS — Z7982 Long term (current) use of aspirin: Secondary | ICD-10-CM | POA: Diagnosis not present

## 2020-10-15 DIAGNOSIS — T8131XA Disruption of external operation (surgical) wound, not elsewhere classified, initial encounter: Secondary | ICD-10-CM | POA: Diagnosis not present

## 2020-10-15 NOTE — Telephone Encounter (Signed)
Fine

## 2020-10-16 ENCOUNTER — Telehealth: Payer: Self-pay | Admitting: Internal Medicine

## 2020-10-16 ENCOUNTER — Emergency Department (HOSPITAL_COMMUNITY): Payer: Medicare Other

## 2020-10-16 ENCOUNTER — Encounter (HOSPITAL_BASED_OUTPATIENT_CLINIC_OR_DEPARTMENT_OTHER): Payer: Medicare Other | Attending: Internal Medicine | Admitting: Internal Medicine

## 2020-10-16 ENCOUNTER — Emergency Department (HOSPITAL_COMMUNITY)
Admission: EM | Admit: 2020-10-16 | Discharge: 2020-10-16 | Disposition: A | Discharge status: Home / Self Care | Payer: Medicare Other | Attending: Emergency Medicine | Admitting: Emergency Medicine

## 2020-10-16 ENCOUNTER — Other Ambulatory Visit: Payer: Self-pay

## 2020-10-16 ENCOUNTER — Encounter (HOSPITAL_COMMUNITY): Payer: Self-pay

## 2020-10-16 DIAGNOSIS — I5032 Chronic diastolic (congestive) heart failure: Secondary | ICD-10-CM | POA: Insufficient documentation

## 2020-10-16 DIAGNOSIS — K6389 Other specified diseases of intestine: Secondary | ICD-10-CM | POA: Diagnosis not present

## 2020-10-16 DIAGNOSIS — Z87891 Personal history of nicotine dependence: Secondary | ICD-10-CM | POA: Diagnosis not present

## 2020-10-16 DIAGNOSIS — K316 Fistula of stomach and duodenum: Secondary | ICD-10-CM | POA: Diagnosis not present

## 2020-10-16 DIAGNOSIS — Y833 Surgical operation with formation of external stoma as the cause of abnormal reaction of the patient, or of later complication, without mention of misadventure at the time of the procedure: Secondary | ICD-10-CM | POA: Insufficient documentation

## 2020-10-16 DIAGNOSIS — R079 Chest pain, unspecified: Secondary | ICD-10-CM | POA: Insufficient documentation

## 2020-10-16 DIAGNOSIS — J9611 Chronic respiratory failure with hypoxia: Secondary | ICD-10-CM | POA: Diagnosis not present

## 2020-10-16 DIAGNOSIS — J984 Other disorders of lung: Secondary | ICD-10-CM | POA: Diagnosis not present

## 2020-10-16 DIAGNOSIS — I2510 Atherosclerotic heart disease of native coronary artery without angina pectoris: Secondary | ICD-10-CM | POA: Insufficient documentation

## 2020-10-16 DIAGNOSIS — E43 Unspecified severe protein-calorie malnutrition: Secondary | ICD-10-CM | POA: Diagnosis not present

## 2020-10-16 DIAGNOSIS — T8131XA Disruption of external operation (surgical) wound, not elsewhere classified, initial encounter: Secondary | ICD-10-CM | POA: Diagnosis not present

## 2020-10-16 DIAGNOSIS — K3189 Other diseases of stomach and duodenum: Secondary | ICD-10-CM | POA: Diagnosis not present

## 2020-10-16 DIAGNOSIS — E860 Dehydration: Secondary | ICD-10-CM | POA: Diagnosis not present

## 2020-10-16 DIAGNOSIS — Z96653 Presence of artificial knee joint, bilateral: Secondary | ICD-10-CM | POA: Diagnosis not present

## 2020-10-16 DIAGNOSIS — Z9889 Other specified postprocedural states: Secondary | ICD-10-CM | POA: Diagnosis not present

## 2020-10-16 DIAGNOSIS — R110 Nausea: Secondary | ICD-10-CM | POA: Insufficient documentation

## 2020-10-16 DIAGNOSIS — L89302 Pressure ulcer of unspecified buttock, stage 2: Secondary | ICD-10-CM | POA: Diagnosis not present

## 2020-10-16 DIAGNOSIS — M069 Rheumatoid arthritis, unspecified: Secondary | ICD-10-CM | POA: Diagnosis not present

## 2020-10-16 DIAGNOSIS — R1032 Left lower quadrant pain: Secondary | ICD-10-CM | POA: Diagnosis present

## 2020-10-16 DIAGNOSIS — L89322 Pressure ulcer of left buttock, stage 2: Secondary | ICD-10-CM | POA: Insufficient documentation

## 2020-10-16 DIAGNOSIS — R1084 Generalized abdominal pain: Secondary | ICD-10-CM | POA: Insufficient documentation

## 2020-10-16 DIAGNOSIS — Z9981 Dependence on supplemental oxygen: Secondary | ICD-10-CM | POA: Diagnosis not present

## 2020-10-16 DIAGNOSIS — K5750 Diverticulosis of both small and large intestine without perforation or abscess without bleeding: Secondary | ICD-10-CM | POA: Diagnosis not present

## 2020-10-16 DIAGNOSIS — K838 Other specified diseases of biliary tract: Secondary | ICD-10-CM | POA: Diagnosis not present

## 2020-10-16 DIAGNOSIS — R0600 Dyspnea, unspecified: Secondary | ICD-10-CM | POA: Diagnosis not present

## 2020-10-16 LAB — URINALYSIS, ROUTINE W REFLEX MICROSCOPIC
Bilirubin Urine: NEGATIVE
Glucose, UA: NEGATIVE mg/dL
Ketones, ur: 5 mg/dL — AB
Leukocytes,Ua: NEGATIVE
Nitrite: NEGATIVE
Protein, ur: NEGATIVE mg/dL
Specific Gravity, Urine: >1.046 — ABNORMAL HIGH (ref 1.005–1.030)
pH: 5 (ref 5.0–8.0)

## 2020-10-16 LAB — COMPREHENSIVE METABOLIC PANEL
ALT: 16 U/L (ref 0–44)
AST: 24 U/L (ref 15–41)
Albumin: 2.8 g/dL — ABNORMAL LOW (ref 3.5–5.0)
Alkaline Phosphatase: 126 U/L (ref 38–126)
Anion gap: 11 (ref 5–15)
BUN: 13 mg/dL (ref 8–23)
CO2: 25 mmol/L (ref 22–32)
Calcium: 8.8 mg/dL — ABNORMAL LOW (ref 8.9–10.3)
Chloride: 98 mmol/L (ref 98–111)
Creatinine, Ser: 0.61 mg/dL (ref 0.44–1.00)
GFR, Estimated: >60 mL/min (ref >60)
Glucose, Bld: 97 mg/dL (ref 70–99)
Potassium: 3.9 mmol/L (ref 3.5–5.1)
Sodium: 134 mmol/L — ABNORMAL LOW (ref 135–145)
Total Bilirubin: 0.5 mg/dL (ref 0.3–1.2)
Total Protein: 7.2 g/dL (ref 6.5–8.1)

## 2020-10-16 LAB — CBC WITH DIFFERENTIAL/PLATELET
Abs Immature Granulocytes: 0.06 10*3/uL (ref 0.00–0.07)
Basophils Absolute: 0.1 10*3/uL (ref 0.0–0.1)
Basophils Relative: 0 %
Eosinophils Absolute: 0.1 10*3/uL (ref 0.0–0.5)
Eosinophils Relative: 1 %
HCT: 42.4 % (ref 36.0–46.0)
Hemoglobin: 13.2 g/dL (ref 12.0–15.0)
Immature Granulocytes: 0 %
Lymphocytes Relative: 50 %
Lymphs Abs: 7.2 10*3/uL — ABNORMAL HIGH (ref 0.7–4.0)
MCH: 30.5 pg (ref 26.0–34.0)
MCHC: 31.1 g/dL (ref 30.0–36.0)
MCV: 97.9 fL (ref 80.0–100.0)
Monocytes Absolute: 1.1 10*3/uL — ABNORMAL HIGH (ref 0.1–1.0)
Monocytes Relative: 8 %
Neutro Abs: 6 10*3/uL (ref 1.7–7.7)
Neutrophils Relative %: 41 %
Platelets: 257 10*3/uL (ref 150–400)
RBC: 4.33 MIL/uL (ref 3.87–5.11)
RDW: 19.8 % — ABNORMAL HIGH (ref 11.5–15.5)
WBC: 14.6 10*3/uL — ABNORMAL HIGH (ref 4.0–10.5)
nRBC: 0 % (ref 0.0–0.2)

## 2020-10-16 LAB — LIPASE, BLOOD: Lipase: 24 U/L (ref 11–51)

## 2020-10-16 MED ORDER — IOHEXOL 300 MG/ML  SOLN
100.0000 mL | Freq: Once | INTRAMUSCULAR | Status: AC | PRN
  Administered 2020-10-16: 75 mL via INTRAVENOUS

## 2020-10-16 MED ORDER — SODIUM CHLORIDE 0.9 % IV SOLN: INTRAVENOUS | Status: DC

## 2020-10-16 MED ORDER — FENTANYL CITRATE (PF) 100 MCG/2ML IJ SOLN
50.0000 ug | Freq: Once | INTRAMUSCULAR | Status: AC
  Administered 2020-10-16: 50 ug via INTRAVENOUS
  Filled 2020-10-16: qty 2

## 2020-10-16 MED ORDER — ONDANSETRON HCL 4 MG/2ML IJ SOLN
4.0000 mg | Freq: Once | INTRAMUSCULAR | Status: AC
  Administered 2020-10-16: 4 mg via INTRAVENOUS
  Filled 2020-10-16: qty 2

## 2020-10-16 NOTE — Discharge Instructions (Signed)
Today's evaluation has been generally reassuring.  Your CT scan, blood work, urinalysis, did not show evidence for a new infection.  However, it is important that you follow-up with your physicians as scheduled in the coming days.  If you develop new, or concerning changes, return here for additional evaluation.

## 2020-10-16 NOTE — ED Triage Notes (Signed)
abd pain worsening since "abdominal surgery in feb". Also c/o back pain.

## 2020-10-16 NOTE — Progress Notes (Signed)
[name] [name] ([id number]) Visit Report for 10/16/2020 Chief Complaint Document Details Patient Name: Date of Service: [name] [name]. 10/16/2020 7:30 A M Medical Record Number: [id number] Patient Account Number: [id number] Date of Birth/Sex: Treating RN: Sep 28, 1930 (87 y.o. Female) Baruch Gouty Primary Care Provider: Pricilla Holm Other Clinician: Referring Provider: Treating Provider/Extender: Verlene Mayer in Treatment: 0 Information Obtained from: Patient Chief Complaint Patient presents for evaluation of multiple abdominal wounds and buttocks wound Electronic Signature(s) Signed: 10/16/2020 2:09:09 PM By: Kalman Shan DO Entered By: Kalman Shan on 10/16/2020 13:57:21 -------------------------------------------------------------------------------- HPI Details Patient Name: Date of Service: [name] [name]. 10/16/2020 7:30 A M Medical Record Number: [id number] Patient Account Number: [id number] Date of Birth/Sex: Treating RN: 06-27-1931 (87 y.o. Female) Baruch Gouty Primary Care Provider: Pricilla Holm Other Clinician: Referring Provider: Treating Provider/Extender: Verlene Mayer in Treatment: 0 History of Present Illness HPI Description: Ms. Dickerson is an 87 year old female with past medical history of rheumatoid arthritis, interstitial lung disease complicated by chronic respiratory failure on 2L O2 via [place], coronary artery disease, history of SBO with recent resection of small bowel, and severe protein calorie malnutrition. Patient had exploratory laparotomy with small bowel resection and gastrostomy tube placement on 08/23/2020 by Dr. Zenia Resides. She was discharged from the hospital on 3/1 and had G-tube removed before leaving the hospital. She visited the ED due to increased pain on 3/23 and had a CT scan of the abdomen with no acute intra-abdominal pathology identified. She has since followed up with  general surgery on 3/26 and 4/6. Today the patient presents with 4 wounds. She has an abdominal wound dehisced in two places. She has a small opening from her previous G-tube site. And she has a sacral wound. All wounds started in the past month after her hospital discharge for small bowel obstruction status post ex lap. She is currently using nystatin cream to the skin breakdown surrounding the previous gastric tube site daily. She is doing dry gauze over this as well. She will be switching to ILEX barrier paste on Monday. She has pain to the gastric tube site today. She reports pain daily to all wound sites, especially when she moves, but the intensity hasn't changed in the past 2 weeks. She is using silver alginate to the gluteal wound daily. For the abdominal dehisced wound she is using dry gauze twice daily and as needed. She denies fever/chills, nausea/vomiting or purulent drainage from any site. She does notice some slight yellow/green serous drainage from the previous G- tube site. Electronic Signature(s) Signed: 10/16/2020 2:09:09 PM By: Kalman Shan DO Entered By: Kalman Shan on 10/16/2020 14:01:10 -------------------------------------------------------------------------------- Physical Exam Details Patient Name: Date of Service: [name] [name]. 10/16/2020 7:30 A M Medical Record Number: [id number] Patient Account Number: [id number] Date of Birth/Sex: Treating RN: 1930/09/12 (87 y.o. Female) Baruch Gouty Primary Care Provider: Pricilla Holm Other Clinician: Referring Provider: Treating Provider/Extender: Verlene Mayer in Treatment: 0 Constitutional respirations regular, non-labored and within target range for patient.. Notes General: Patient appears slightly uncomfortable. She is on her home O2 of 2 L via nasal cannula. She has temporal wasting and appears very frail. Midline abdominal wound dehiscence: With open skin in 2 places and  sutures in place. The underlying fascia is intact and wound beds are clean and pink. The sacral wound has a very small granulation bed present and appears clean without signs of infection. Left upper quadrant G-tube site has gastric contents  and significant skin excoriation, Slight green residue Electronic Signature(s) Signed: 10/16/2020 2:09:09 PM By: Kalman Shan DO Entered By: Kalman Shan on 10/16/2020 14:02:25 -------------------------------------------------------------------------------- Physician Orders Details Patient Name: Date of Service: [name] [name]. 10/16/2020 7:30 A M Medical Record Number: [id number] Patient Account Number: [id number] Date of Birth/Sex: Treating RN: February 21, 1931 (87 y.o. Female) Baruch Gouty Primary Care Provider: Pricilla Holm Other Clinician: Referring Provider: Treating Provider/Extender: Verlene Mayer in Treatment: 0 Verbal / Phone Orders: No Diagnosis Coding ICD-10 Coding Code Description E43 Unspecified severe protein-calorie malnutrition M06.9 Rheumatoid arthritis, unspecified J84.9 Interstitial pulmonary disease, unspecified J96.11 Chronic respiratory failure with hypoxia I25.10 Atherosclerotic heart disease of native coronary artery without angina pectoris L89.302 Pressure ulcer of unspecified buttock, stage 2 Follow-up Appointments Return Appointment in 1 week. Bathing/ Shower/ Hygiene Do not shower or bathe in tub. Home Health Dressing changes to be completed by [place] on Monday / Wednesday / Friday except when patient has scheduled visit at [facility]. Other Home Health Orders/Instructions: - [facility] Wound Treatment Wound #1 - Gluteus Wound Laterality: Left Cleanser: Soap and Water 3 x Per Week/30 Days Discharge Instructions: May shower and wash wound with dial antibacterial soap and water prior to dressing change. Prim Dressing: KerraCel Ag Gelling Fiber Dressing, 2x2 in  (silver alginate) (Home Health) 3 x Per Week/30 Days ary Discharge Instructions: Apply silver alginate to wound bed as instructed Secondary Dressing: ComfortFoam Border, 3x3 in (silicone border) ([place]) 3 x Per Week/30 Days Discharge Instructions: Apply over primary dressing as directed. Wound #2 - Abdomen - midline Cleanser: Normal Saline (Home Health) 3 x Per Week/30 Days Discharge Instructions: Cleanse the wound with Normal Saline prior to applying a clean dressing using gauze sponges, not tissue or cotton balls. Prim Dressing: KerraCel Ag Gelling Fiber Dressing, 4x5 in (silver alginate) (Home Health) 3 x Per Week/30 Days ary Discharge Instructions: Apply silver alginate to wound bed , line wound bed with alginate Secondary Dressing: Woven Gauze Sponge, Non-Sterile 4x4 in (Home Health) 3 x Per Week/30 Days Discharge Instructions: Apply over primary dressing as directed. Secondary Dressing: ABD Pad, 8x10 (Home Health) 3 x Per Week/30 Days Discharge Instructions: Apply over primary dressing may change daily and prn for dranage Secured With: 40M Medipore H Soft Cloth Surgical T 4 x 2 (in/yd) (Home Health) 3 x Per Week/30 Days ape Discharge Instructions: Secure dressing with tape as directed. Wound #3 - Abdomen - midline Wound Laterality: Superior Cleanser: Normal Saline (Home Health) 3 x Per Week/30 Days Discharge Instructions: Cleanse the wound with Normal Saline prior to applying a clean dressing using gauze sponges, not tissue or cotton balls. Prim Dressing: KerraCel Ag Gelling Fiber Dressing, 4x5 in (silver alginate) (Home Health) 3 x Per Week/30 Days ary Discharge Instructions: Apply silver alginate to wound bed , line wound bed with alginate Secondary Dressing: Woven Gauze Sponge, Non-Sterile 4x4 in (Home Health) 3 x Per Week/30 Days Discharge Instructions: Apply over primary dressing as directed. Secondary Dressing: ABD Pad, 8x10 (Home Health) 3 x Per Week/30 Days Discharge  Instructions: Apply over primary dressing may change daily and prn for dranage Secured With: 40M Medipore H Soft Cloth Surgical T 4 x 2 (in/yd) (Home Health) 3 x Per Week/30 Days ape Discharge Instructions: Secure dressing with tape as directed. Wound #4 - Abdomen - Upper Quadrant Wound Laterality: Left, Lateral Cleanser: Soap and Water 1 x Per Day/30 Days Discharge Instructions: May shower and wash wound with dial antibacterial soap and water  prior to dressing change. Peri-Wound Care: Zinc Oxide Ointment 30g tube (Home Health) 1 x Per Day/30 Days Discharge Instructions: Apply Zinc Oxide or ilex cream to excoriated periwound with each dressing change Secondary Dressing: Woven Gauze Sponge, Non-Sterile 4x4 in (Home Health) 1 x Per Day/30 Days Discharge Instructions: Apply over primary dressing as directed. Secondary Dressing: ABD Pad, 8x10 (Home Health) 1 x Per Day/30 Days Discharge Instructions: Apply over primary dressing as directed. Secondary Dressing: Zetuvit Plus 4x8 in (Home Health) 1 x Per Day/30 Days Discharge Instructions: or equivalent extra absorbant pad Laboratory naerobe culture (MICRO) - left abdomen Gtube site Bacteria identified in Unspecified specimen by A LOINC Code: 295-6 Convenience Name: Anerobic culture Electronic Signature(s) Signed: 10/16/2020 2:09:09 PM By: Kalman Shan DO Entered By: Kalman Shan on 10/16/2020 14:02:43 -------------------------------------------------------------------------------- Problem List Details Patient Name: Date of Service: [name] [name]. 10/16/2020 7:30 A M Medical Record Number: [id number] Patient Account Number: [id number] Date of Birth/Sex: Treating RN: [birth year] (87 y.o. Female) Baruch Gouty Primary Care Provider: Pricilla Holm Other Clinician: Referring Provider: Treating Provider/Extender: Verlene Mayer in Treatment: 0 Active Problems ICD-10 Encounter Code Description Active  Date MDM Diagnosis L89.302 Pressure ulcer of unspecified buttock, stage 2 10/16/2020 No Yes T81.30XA Disruption of wound, unspecified, initial encounter 10/16/2020 No Yes K94.23 Gastrostomy malfunction 10/16/2020 No Yes E43 Unspecified severe protein-calorie malnutrition 10/16/2020 No Yes M06.9 Rheumatoid arthritis, unspecified 10/16/2020 No Yes J84.9 Interstitial pulmonary disease, unspecified 10/16/2020 No Yes J96.11 Chronic respiratory failure with hypoxia 10/16/2020 No Yes I25.10 Atherosclerotic heart disease of native coronary artery without angina pectoris 10/16/2020 No Yes Inactive Problems Resolved Problems Electronic Signature(s) Signed: 10/16/2020 2:09:09 PM By: Kalman Shan DO Entered By: Kalman Shan on 10/16/2020 13:56:16 -------------------------------------------------------------------------------- Progress Note Details Patient Name: Date of Service: [name] [name]. 10/16/2020 7:30 A M Medical Record Number: [id number] Patient Account Number: [id number] Date of Birth/Sex: Treating RN: [birth year] (87 y.o. Female) Baruch Gouty Primary Care Provider: Pricilla Holm Other Clinician: Referring Provider: Treating Provider/Extender: Verlene Mayer in Treatment: 0 Subjective Chief Complaint Information obtained from Patient Patient presents for evaluation of multiple abdominal wounds and buttocks wound History of Present Illness (HPI) Ms. Herdt is an 87 year old female with past medical history of rheumatoid arthritis, interstitial lung disease complicated by chronic respiratory failure on 2L O2 via [place], coronary artery disease, history of SBO with recent resection of small bowel, and severe protein calorie malnutrition. Patient had exploratory laparotomy with small bowel resection and gastrostomy tube placement on 08/23/2020 by Dr. Zenia Resides. She was discharged from the hospital on 3/1 and had G-tube removed before leaving the hospital. She visited the  ED due to increased pain on 3/23 and had a CT scan of the abdomen with no acute intra-abdominal pathology identified. She has since followed up with general surgery on 3/26 and 4/6. Today the patient presents with 4 wounds. She has an abdominal wound dehisced in two places. She has a small opening from her previous G-tube site. And she has a sacral wound. All wounds started in the past month after her hospital discharge for small bowel obstruction status post ex lap. She is currently using nystatin cream to the skin breakdown surrounding the previous gastric tube site daily. She is doing dry gauze over this as well. She will be switching to ILEX barrier paste on Monday. She has pain to the gastric tube site today. She reports pain daily to all wound sites, especially when she moves, but the intensity  hasn't changed in the past 2 weeks. She is using silver alginate to the gluteal wound daily. For the abdominal dehisced wound she is using dry gauze twice daily and as needed. She denies fever/chills, nausea/vomiting or purulent drainage from any site. She does notice some slight yellow/green serous drainage from the previous G- tube site. Patient History Information obtained from Patient. Allergies Crestor (Reaction: unknown), lactose (Reaction: intolerance), penicillin (Reaction: hives), Imuran (Reaction: nausea/vomiting) Family History Cancer - Child, No family history of Diabetes, Heart Disease, Hereditary Spherocytosis, Hypertension, Kidney Disease, Lung Disease, Seizures, Stroke, Thyroid Problems, Tuberculosis. Social History Former smoker, Marital Status - Widowed, Alcohol Use - Never, Drug Use - No History, Caffeine Use - Rarely. Medical History Eyes Patient has history of Cataracts - bil removed Denies history of Glaucoma, Optic Neuritis Hematologic/Lymphatic Patient has history of Anemia - iron deficiency Respiratory Denies history of Aspiration, Asthma, Chronic Obstructive  Pulmonary Disease (COPD), Pneumothorax, Sleep Apnea, Tuberculosis Cardiovascular Patient has history of Congestive Heart Failure, Coronary Artery Disease, Peripheral Venous Disease - varicose veins Denies history of Phlebitis, Vasculitis Endocrine Denies history of Type I Diabetes, Type II Diabetes Integumentary (Skin) Denies history of History of Burn Musculoskeletal Patient has history of Rheumatoid Arthritis Oncologic Denies history of Received Chemotherapy, Received Radiation Psychiatric Denies history of Anorexia/bulimia, Confinement Anxiety Hospitalization/Surgery History - bil knee replacement. - exp. lap with g tube placement. - cardiac cath. - abdominal hysterectomy. - appendectomy. - tonsillectomy. Medical A Surgical History Notes nd Respiratory pulmonary fibrosis Gastrointestinal SBO, Gtube removal Integumentary (Skin) psoriasis Psychiatric protein calorie malnutrition Review of Systems (ROS) Constitutional Symptoms (General Health) Complains or has symptoms of Fatigue. Denies complaints or symptoms of Fever, Chills, Marked Weight Change. Eyes Complains or has symptoms of Glasses / Contacts. Denies complaints or symptoms of Dry Eyes, Vision Changes. Ear/Nose/Mouth/Throat Denies complaints or symptoms of Chronic sinus problems or rhinitis. Respiratory Complains or has symptoms of Shortness of Breath. Denies complaints or symptoms of Chronic or frequent coughs. Cardiovascular Denies complaints or symptoms of Chest pain. Gastrointestinal Denies complaints or symptoms of Frequent diarrhea, Nausea, Vomiting. Endocrine Denies complaints or symptoms of Heat/cold intolerance. Genitourinary Denies complaints or symptoms of Frequent urination. Integumentary (Skin) Complains or has symptoms of Wounds - abdomen, left glut. Musculoskeletal Denies complaints or symptoms of Muscle Pain, Muscle Weakness. Neurologic Denies complaints or symptoms of  Numbness/parasthesias. Psychiatric Denies complaints or symptoms of Claustrophobia, Suicidal. Objective Constitutional respirations regular, non-labored and within target range for patient.. Vitals Time Taken: 7:56 AM, Height: 64 in, Source: Stated, Weight: 98 lbs, Source: Stated, BMI: 16.8, Temperature: 97.7 F, Pulse: 118 bpm, Respiratory Rate: 18 breaths/min, Blood Pressure: 104/74 mmHg. General Notes: General: Patient appears slightly uncomfortable. She is on her home O2 of 2 L via nasal cannula. She has temporal wasting and appears very frail. Midline abdominal wound dehiscence: With open skin in 2 places and sutures in place. The underlying fascia is intact and wound beds are clean and pink. The sacral wound has a very small granulation bed present and appears clean without signs of infection. Left upper quadrant G-tube site has gastric contents and significant skin excoriation, Slight green residue Integumentary (Hair, Skin) Wound #1 status is Open. Original cause of wound was Pressure Injury. The date acquired was: 08/11/2020. The wound is located on the Left Gluteus. The wound measures 0.3cm length x 1.1cm width x 0.1cm depth; 0.259cm^2 area and 0.026cm^3 volume. The wound is limited to skin breakdown. There is no tunneling or undermining noted. There is a small amount  of serosanguineous drainage noted. The wound margin is flat and intact. There is large (67-100%) red granulation within the wound bed. There is no necrotic tissue within the wound bed. Wound #2 status is Open. Original cause of wound was Surgical Injury. The date acquired was: 08/23/2020. The wound is located on the Abdomen - midline. The wound measures 7cm length x 403cm width x 0.8cm depth; 2215.608cm^2 area and 1772.487cm^3 volume. There is muscle and Fat Layer (Subcutaneous Tissue) exposed. There is no tunneling noted, however, there is undermining starting at 6:00 and ending at 12:00 with a maximum distance of 2.9cm.  There is a large amount of serosanguineous drainage noted. The wound margin is well defined and not attached to the wound base. There is medium (34-66%) red granulation within the wound bed. There is a medium (34-66%) amount of necrotic tissue within the wound bed including Adherent Slough. Wound #3 status is Open. Original cause of wound was Surgical Injury. The date acquired was: 08/23/2020. The wound is located on the Superior Abdomen - midline. The wound measures 2cm length x 1.8cm width x 1.3cm depth; 2.827cm^2 area and 3.676cm^3 volume. There is muscle and Fat Layer (Subcutaneous Tissue) exposed. There is no tunneling noted, however, there is undermining starting at 8:00 and ending at 11:00 with a maximum distance of 2.7cm. There is a medium amount of serous drainage noted. The wound margin is well defined and not attached to the wound base. There is medium (34-66%) red granulation within the wound bed. There is a medium (34-66%) amount of necrotic tissue within the wound bed including Adherent Slough. Wound #4 status is Open. Original cause of wound was Surgical Injury. The date acquired was: 09/08/2020. The wound is located on the Left,Lateral Abdomen - Upper Quadrant. The wound measures 0.2cm length x 0.4cm width x 2.6cm depth; 0.063cm^2 area and 0.163cm^3 volume. There is Fat Layer (Subcutaneous Tissue) exposed. There is no tunneling or undermining noted. There is a large amount of purulent drainage noted. The wound margin is well defined and not attached to the wound base. There is small (1-33%) red granulation within the wound bed. There is no necrotic tissue within the wound bed. Assessment Active Problems ICD-10 Pressure ulcer of unspecified buttock, stage 2 Disruption of wound, unspecified, initial encounter Gastrostomy malfunction Unspecified severe protein-calorie malnutrition Rheumatoid arthritis, unspecified Interstitial pulmonary disease, unspecified Chronic respiratory failure  with hypoxia Atherosclerotic heart disease of native coronary artery without angina pectoris Assessment: Surgical wound dehiscence Patient's recent abdominal wound has dehisced in two areas. There are no signs of infection to the sites. She is currently using dry gauze with twice daily wound changes. Sutures are still in place and will need to discuss with Dr. Zenia Resides future plans for removal. I would eventually like to place a wound VAC over this area to see if we can close it once it is appropriate to remove sutures. Patient is also on cellcept and prednisone for RA and this may impede wound healing as well. 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 It is causing excoriation of the skin on the surface. It is also causing the patient acute pain to the site. She would not be able to tolerate a wound vac to the other wounds sites if this continues. I will discuss with surgery if there are any plans to close this area or to refer to gastroenterology for evaluation. With no purulent drainage or  increased pain in the past 1-2 weeks, fevers/or chills I do not think imaging is needed at this time. But will continue to reassess at future visits. Assessment: Right gluteal pressure ulcer, stage II Patient has a very small wound to this side and will likely close in the next week. She is currently using silver alginate. Plan Follow-up Appointments: Return Appointment in 1 week. Bathing/ Shower/ Hygiene: Do not shower or bathe in tub. Home Health: Dressing changes to be completed by [place] on Monday / Wednesday / Friday except when patient has scheduled visit at [facility]. Other Home Health Orders/Instructions: - [facility] Laboratory ordered were: Anerobic culture - left abdomen Gtube site WOUND #1: - Gluteus Wound Laterality: Left Cleanser: Soap and Water 3 x Per Week/30  Days Discharge Instructions: May shower and wash wound with dial antibacterial soap and water prior to dressing change. Prim Dressing: KerraCel Ag Gelling Fiber Dressing, 2x2 in (silver alginate) (Home Health) 3 x Per Week/30 Days ary Discharge Instructions: Apply silver alginate to wound bed as instructed Secondary Dressing: ComfortFoam Border, 3x3 in (silicone border) ([place]) 3 x Per Week/30 Days Discharge Instructions: Apply over primary dressing as directed. WOUND #2: - Abdomen - midline Wound Laterality: Cleanser: Normal Saline (Home Health) 3 x Per Week/30 Days Discharge Instructions: Cleanse the wound with Normal Saline prior to applying a clean dressing using gauze sponges, not tissue or cotton balls. Prim Dressing: KerraCel Ag Gelling Fiber Dressing, 4x5 in (silver alginate) (Home Health) 3 x Per Week/30 Days ary Discharge Instructions: Apply silver alginate to wound bed , line wound bed with alginate Secondary Dressing: Woven Gauze Sponge, Non-Sterile 4x4 in (Home Health) 3 x Per Week/30 Days Discharge Instructions: Apply over primary dressing as directed. Secondary Dressing: ABD Pad, 8x10 (Home Health) 3 x Per Week/30 Days Discharge Instructions: Apply over primary dressing may change daily and prn for dranage Secured With: 71M Medipore H Soft Cloth Surgical T 4 x 2 (in/yd) (Home Health) 3 x Per Week/30 Days ape Discharge Instructions: Secure dressing with tape as directed. WOUND #3: - Abdomen - midline Wound Laterality: Superior Cleanser: Normal Saline (Home Health) 3 x Per Week/30 Days Discharge Instructions: Cleanse the wound with Normal Saline prior to applying a clean dressing using gauze sponges, not tissue or cotton balls. Prim Dressing: KerraCel Ag Gelling Fiber Dressing, 4x5 in (silver alginate) (Home Health) 3 x Per Week/30 Days ary Discharge Instructions: Apply silver alginate to wound bed , line wound bed with alginate Secondary Dressing: Woven Gauze Sponge,  Non-Sterile 4x4 in (Home Health) 3 x Per Week/30 Days Discharge Instructions: Apply over primary dressing as directed. Secondary Dressing: ABD Pad, 8x10 (Home Health) 3 x Per Week/30 Days Discharge Instructions: Apply over primary dressing may change daily and prn for dranage Secured With: 71M Medipore H Soft Cloth Surgical T 4 x 2 (in/yd) (Home Health) 3 x Per Week/30 Days ape Discharge Instructions: Secure dressing with tape as directed. WOUND #4: - Abdomen - Upper Quadrant Wound Laterality: Left, Lateral Cleanser: Soap and Water 1 x Per Day/30 Days Discharge Instructions: May shower and wash wound with dial antibacterial soap and water prior to dressing change. Peri-Wound Care: Zinc Oxide Ointment 30g tube (Home Health) 1 x Per Day/30 Days Discharge Instructions: Apply Zinc Oxide or ilex cream to excoriated periwound with each dressing change Secondary Dressing: Woven Gauze Sponge, Non-Sterile 4x4 in (Home Health) 1 x Per Day/30 Days Discharge Instructions: Apply over primary dressing as directed. Secondary Dressing: ABD Pad, 8x10 (Home  Health) 1 x Per Day/30 Days Discharge Instructions: Apply over primary dressing as directed. Secondary Dressing: Zetuvit Plus 4x8 in (Home Health) 1 x Per Day/30 Days Discharge Instructions: or equivalent extra absorbant pad 1. Surgical wound dehiscence: Will do silver alginate dressings 3 times weekly with home health. We will have ABD pads as a secondary dressing and this can be changed as needed. This will help with pain with dressing changes as patient is not tolerating twice daily dressing changes. We will also discuss with Dr. Zenia Resides the future of suture removal. Eventually I would like to place a wound VAC over this area. 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 Zenia Resides if closure to this site is possible. May need referral to GI. Agree with continuing ilex barrier cream. Obtained culture due to green residue and  acute pain. Pain is likely from the gastric secretions. 3) Right gluteal pressure ulcer, stage II Silver alginate daily. The site is almost closed. Due to pain from her previous abdominal surgery and G-tube site she is not able to offload completely. We will revisit if this site does not progress in the next week. Electronic Signature(s) Signed: 10/16/2020 2:09:09 PM By: Kalman Shan DO Entered By: Kalman Shan on 10/16/2020 14:08:21 -------------------------------------------------------------------------------- HxROS Details Patient Name: Date of Service: [name] [name]. 10/16/2020 7:30 A M Medical Record Number: [id number] Patient Account Number: [id number] Date of Birth/Sex: Treating RN: April 26, 1931 (87 y.o. Female) Baruch Gouty Primary Care Provider: Pricilla Holm Other Clinician: Referring Provider: Treating Provider/Extender: Charlie Pitter in Treatment: 0 Information Obtained From Patient Constitutional Symptoms (General Health) Complaints and Symptoms: Positive for: Fatigue Negative for: Fever; Chills; Marked Weight Change Eyes Complaints and Symptoms: Positive for: Glasses / Contacts Negative for: Dry Eyes; Vision Changes Medical History: Positive for: Cataracts - bil removed Negative for: Glaucoma; Optic Neuritis Ear/Nose/Mouth/Throat Complaints and Symptoms: Negative for: Chronic sinus problems or rhinitis Respiratory Complaints and Symptoms: Positive for: Shortness of Breath Negative for: Chronic or frequent coughs Medical History: Negative for: Aspiration; Asthma; Chronic Obstructive Pulmonary Disease (COPD); Pneumothorax; Sleep Apnea; Tuberculosis Past Medical History Notes: pulmonary fibrosis Cardiovascular Complaints and Symptoms: Negative for: Chest pain Medical History: Positive for: Congestive Heart Failure; Coronary Artery Disease; Peripheral Venous Disease - varicose veins Negative for: Phlebitis;  Vasculitis Gastrointestinal Complaints and Symptoms: Negative for: Frequent diarrhea; Nausea; Vomiting Medical History: Past Medical History Notes: SBO, Gtube removal Endocrine Complaints and Symptoms: Negative for: Heat/cold intolerance Medical History: Negative for: Type I Diabetes; Type II Diabetes Genitourinary Complaints and Symptoms: Negative for: Frequent urination Integumentary (Skin) Complaints and Symptoms: Positive for: Wounds - abdomen, left glut Medical History: Negative for: History of Burn Past Medical History Notes: psoriasis Musculoskeletal Complaints and Symptoms: Negative for: Muscle Pain; Muscle Weakness Medical History: Positive for: Rheumatoid Arthritis Neurologic Complaints and Symptoms: Negative for: Numbness/parasthesias Psychiatric Complaints and Symptoms: Negative for: Claustrophobia; Suicidal Medical History: Negative for: Anorexia/bulimia; Confinement Anxiety Past Medical History Notes: protein calorie malnutrition Hematologic/Lymphatic Medical History: Positive for: Anemia - iron deficiency Immunological Oncologic Medical History: Negative for: Received Chemotherapy; Received Radiation HBO Extended History Items Eyes: Cataracts Immunizations Pneumococcal Vaccine: Received Pneumococcal Vaccination: Yes Implantable Devices No devices added Hospitalization / Surgery History Type of Hospitalization/Surgery bil knee replacement exp. lap with g tube placement cardiac cath abdominal hysterectomy appendectomy tonsillectomy Family and Social History Cancer: Yes - Child; Diabetes: No; Heart Disease: No; Hereditary Spherocytosis: No; Hypertension: No; Kidney Disease: No; Lung Disease: No; Seizures: No; Stroke: No; Thyroid  Problems: No; Tuberculosis: No; Former smoker; Marital Status - Widowed; Alcohol Use: Never; Drug Use: No History; Caffeine Use: Rarely; Financial Concerns: No; Food, Clothing or Shelter Needs: No; Support System  Lacking: No; Transportation Concerns: No Engineer, maintenance) Signed: 10/16/2020 4:45:31 PM By: Linton Ham MD Signed: 10/16/2020 5:00:24 PM By: Baruch Gouty RN, BSN Entered By: Baruch Gouty on 10/16/2020 08:32:50 -------------------------------------------------------------------------------- SuperBill Details Patient Name: Date of Service: [name] [name]. 10/16/2020 Medical Record Number: [id number] Patient Account Number: [id number] Date of Birth/Sex: Treating RN: [birth year] (87 y.o. Female) Baruch Gouty Primary Care Provider: Pricilla Holm Other Clinician: Referring Provider: Treating Provider/Extender: Verlene Mayer in Treatment: 0 Diagnosis Coding ICD-10 Codes Code Description [phone] Unspecified severe protein-calorie malnutrition M06.9 Rheumatoid arthritis, unspecified J84.9 Interstitial pulmonary disease, unspecified J96.11 Chronic respiratory failure with hypoxia I25.10 Atherosclerotic heart disease of native coronary artery without angina pectoris L89.302 Pressure ulcer of unspecified buttock, stage 2 Facility Procedures CPT4 Code: [id number] Description: 21747 - WOUND CARE VISIT-LEV 5 EST PT Modifier: Quantity: 1 Electronic Signature(s) Signed: 10/16/2020 2:09:09 PM By: Kalman Shan DO Entered By: Kalman Shan on 10/16/2020 11:18:18

## 2020-10-16 NOTE — Telephone Encounter (Signed)
See below

## 2020-10-16 NOTE — Progress Notes (Signed)
[name] [name], [name] [name] ([id number]) Visit Report for 10/16/2020 Abuse/Suicide Risk Screen Details Patient Name: Date of Service: [name] Bob. 10/16/2020 7:30 A M Medical Record Number: [id number] Patient Account Number: [id number] Date of Birth/Sex: Treating RN: [birth year] (87 y.o. Female) Baruch Gouty Primary Care Kellen Dutch: Pricilla Holm Other Clinician: Referring Ericberto Padget: Treating Jahaad Penado/Extender: Verlene Mayer in Treatment: 0 Abuse/Suicide Risk Screen Items Answer ABUSE RISK SCREEN: Has anyone close to you tried to hurt or harm you recentlyo No Do you feel uncomfortable with anyone in your familyo No Has anyone forced you do things that you didnt want to doo No Electronic Signature(s) Signed: 10/16/2020 5:00:24 PM By: Baruch Gouty RN, BSN Entered By: Baruch Gouty on 10/16/2020 08:32:56 -------------------------------------------------------------------------------- Activities of Daily Living Details Patient Name: Date of Service: [name] Bob. 10/16/2020 7:30 A M Medical Record Number: [id number] Patient Account Number: [id number] Date of Birth/Sex: Treating RN: 1931-01-20 (87 y.o. Female) Baruch Gouty Primary Care Huberta Tompkins: Pricilla Holm Other Clinician: Referring Dhiren Azimi: Treating Ariah Mower/Extender: Verlene Mayer in Treatment: 0 Activities of Daily Living Items Answer Activities of Daily Living (Please select one for each item) Drive Automobile Not Able T Medications ake Need Assistance Use T elephone Completely Able Care for Appearance Need Assistance Use T oilet Need Assistance Bath / Shower Need Assistance Dress Self Need Assistance Feed Self Completely Able Walk Need Assistance Get In / Out Bed Need Assistance Housework Need Assistance Prepare Meals Need Assistance Handle Money Need Assistance Shop for Self Need Assistance Electronic Signature(s) Signed: 10/16/2020 5:00:24 PM By:  Baruch Gouty RN, BSN Entered By: Baruch Gouty on 10/16/2020 08:33:41 -------------------------------------------------------------------------------- Education Screening Details Patient Name: Date of Service: [name] Bob. 10/16/2020 7:30 A M Medical Record Number: [id number] Patient Account Number: [id number] Date of Birth/Sex: Treating RN: 1930-10-29 (87 y.o. Female) Baruch Gouty Primary Care Zacchary Pompei: Pricilla Holm Other Clinician: Referring Zenora Karpel: Treating Ellieanna Funderburg/Extender: Verlene Mayer in Treatment: 0 Primary Learner Assessed: Patient Learning Preferences/Education Level/Primary Language Learning Preference: Explanation, Demonstration, Printed Material Highest Education Level: College or Above Preferred Language: English Cognitive Barrier Language Barrier: No Translator Needed: No Memory Deficit: No Emotional Barrier: No Cultural/Religious Beliefs Affecting Medical Care: No Physical Barrier Impaired Vision: Yes Glasses Impaired Hearing: No Decreased Hand dexterity: No Knowledge/Comprehension Knowledge Level: High Comprehension Level: High Ability to understand written instructions: High Ability to understand verbal instructions: High Motivation Anxiety Level: Calm Cooperation: Cooperative Education Importance: Acknowledges Need Interest in Health Problems: Asks Questions Perception: Coherent Willingness to Engage in Self-Management High Activities: Readiness to Engage in Self-Management High Activities: Electronic Signature(s) Signed: 10/16/2020 5:00:24 PM By: Baruch Gouty RN, BSN Entered By: Baruch Gouty on 10/16/2020 08:34:11 -------------------------------------------------------------------------------- Fall Risk Assessment Details Patient Name: Date of Service: [name] Bob. 10/16/2020 7:30 A M Medical Record Number: [id number] Patient Account Number: [id number] Date of Birth/Sex: Treating  RN: Jan 18, 1931 (87 y.o. Female) Baruch Gouty Primary Care Overton Boggus: Pricilla Holm Other Clinician: Referring Jamesrobert Ohanesian: Treating Lavren Lewan/Extender: Verlene Mayer in Treatment: 0 Fall Risk Assessment Items Have you had 2 or more falls in the last 12 monthso 0 Yes Have you had any fall that resulted in injury in the last 12 monthso 0 No FALLS RISK SCREEN History of falling - immediate or within 3 months 0 No Secondary diagnosis (Do you have 2 or more medical diagnoseso) 0 No Ambulatory aid None/bed rest/wheelchair/nurse 0 No Crutches/cane/walker 15 Yes Furniture 0 No Intravenous therapy Access/Saline/Heparin Lock 0 No Gait/Transferring  Normal/ bed rest/ wheelchair 0 No Weak (short steps with or without shuffle, stooped but able to lift head while walking, may seek 10 Yes support from furniture) Impaired (short steps with shuffle, may have difficulty arising from chair, head down, impaired 0 No balance) Mental Status Oriented to own ability 0 Yes Electronic Signature(s) Signed: 10/16/2020 5:00:24 PM By: Baruch Gouty RN, BSN Entered By: Baruch Gouty on 10/16/2020 08:34:38 -------------------------------------------------------------------------------- Foot Assessment Details Patient Name: Date of Service: [name] Bob. 10/16/2020 7:30 A M Medical Record Number: [id number] Patient Account Number: [id number] Date of Birth/Sex: Treating RN: September 16, 1930 (87 y.o. Female) Baruch Gouty Primary Care Keely Drennan: Pricilla Holm Other Clinician: Referring Lazette Estala: Treating Keonia Pasko/Extender: Verlene Mayer in Treatment: 0 Foot Assessment Items Site Locations + = Sensation present, - = Sensation absent, C = Callus, U = Ulcer R = Redness, W = Warmth, M = Maceration, PU = Pre-ulcerative lesion F = Fissure, S = Swelling, D = Dryness Assessment Right: Left: Other Deformity: No No Prior Foot Ulcer: No No Prior  Amputation: No No Charcot Joint: No No Ambulatory Status: Ambulatory With Help Assistance Device: Walker Gait: Steady Electronic Signature(s) Signed: 10/16/2020 5:00:24 PM By: Baruch Gouty RN, BSN Entered By: Baruch Gouty on 10/16/2020 08:35:35 -------------------------------------------------------------------------------- Nutrition Risk Screening Details Patient Name: Date of Service: [name] Bob. 10/16/2020 7:30 A M Medical Record Number: [id number] Patient Account Number: [id number] Date of Birth/Sex: Treating RN: 1931-02-04 (87 y.o. Female) Baruch Gouty Primary Care Addeline Calarco: Pricilla Holm Other Clinician: Referring Janat Tabbert: Treating Percilla Tweten/Extender: Verlene Mayer in Treatment: 0 Height (in): 64 Weight (lbs): 98 Body Mass Index (BMI): 16.8 Nutrition Risk Screening Items Score Screening NUTRITION RISK SCREEN: I have an illness or condition that made me change the kind and/or amount of food I eat 2 Yes I eat fewer than two meals per day 0 No I eat few fruits and vegetables, or milk products 0 No I have three or more drinks of beer, liquor or wine almost every day 0 No I have tooth or mouth problems that make it hard for me to eat 0 No I don't always have enough money to buy the food I need 0 No I eat alone most of the time 0 No I take three or more different prescribed or over-the-counter drugs a day 1 Yes Without wanting to, I have lost or gained 10 pounds in the last six months 2 Yes I am not always physically able to shop, cook and/or feed myself 0 No Nutrition Protocols Good Risk Protocol Moderate Risk Protocol High Risk Proctocol 0 Provide education on nutrition Risk Level: Moderate Risk Score: 5 Electronic Signature(s) Signed: 10/16/2020 5:00:24 PM By: Baruch Gouty RN, BSN Entered By: Baruch Gouty on 10/16/2020 08:35:24

## 2020-10-16 NOTE — Telephone Encounter (Signed)
Patient called and said that she feels like her lungs are crashing into each other, difficulty breathing. She said that she is on oxygen and is still having the difficulty. She said that she also has pain on each side of her lungs. Transferred to [facility].

## 2020-10-16 NOTE — Telephone Encounter (Signed)
Agree with calling 911

## 2020-10-16 NOTE — Telephone Encounter (Signed)
[name] [name] with [facility] calling, states they would like verbal orders to continue pallative care at home [phone] option 2

## 2020-10-16 NOTE — Telephone Encounter (Signed)
[facility] Documentation:  --Caller states she feels like her lungs are crashing. she is having a hard time breathing. It started 2 hours ago. She had this one day last week.  Advised to call EMS 911 now

## 2020-10-16 NOTE — ED Provider Notes (Signed)
[facility] DEPT Provider Note   CSN: [id number] Arrival date & time: 10/16/20  1604     History Chief Complaint  Patient presents with  . Abdominal Pain    [name] [name] is a 87 y.o. female.  HPI Patient presents with left lower quadrant abdominal pain. Patient has notable history of abdominal surgery in February of this year.  She notes that since that time she has had pain focally around the open wounds.  Today, the pain is become worse, severe, and improved with anything.  Some associated nausea, but no reported vomiting, no diarrhea. No reported urinary complaints. Chart review performed, notable for multiple phone visits today, and in recent days with the patient's primary care physician.  Patient has been complaining of chest pain, dyspnea, as well as abdominal pain.  Some concern for worsening of the incision site.  Patient's surgery was for small bowel obstruction, possible volvulus. Beyond this history, she has interstitial lung disease, is on home oxygen.    Past Medical History:  Diagnosis Date  . Allergic rhinitis   . Allergy   . CAD (coronary artery disease)    Mild CAD by cath 2008  . CHF (congestive heart failure) ([place])   . DJD (degenerative joint disease)    rheumatoid  . DVT (deep venous thrombosis) ([place])   . Dyslipidemia   . History of echocardiogram    Echo 12/2018: EF 60-65, mod asymmetric LVH, Gr 1 DD  . History of nuclear stress test    Myoview 5/17:  EF 74%, normal perfusion, low risk // Myoview 12/2018:  EF 89, very mild ischemia in inferoapical wall; Low Risk  . History of pulmonary embolism   . Hyperlipidemia   . Insomnia   . Osteoporosis   . Psoriasis   . Pulmonary fibrosis ([place])   . Rheumatoid arthritis [facility])     Patient Active Problem List   Diagnosis Date Noted  . Diarrhea 10/02/2020  . Pressure injury of skin 09/06/2020  . Acute on chronic respiratory failure with hypoxemia ([place]) 08/23/2020  .  Protein-calorie malnutrition, severe 08/20/2020  . Fall 01/25/2020  . Other fatigue 01/03/2020  . Left shoulder pain 01/03/2020  . Urinary frequency 01/03/2020  . Acute otalgia, left 09/06/2019  . Unintentional weight loss 07/18/2019  . Personal history of PE (pulmonary embolism) 04/23/2019  . Headache 02/11/2019  . Iron deficiency anemia 12/21/2018  . Cold sore 10/23/2018  . Blood in stool 09/14/2018  . 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 Therapeutic drug monitoring 07/17/2018  . Chronic diastolic CHF (congestive heart failure) ([place]) 12/19/2017  . Rash 08/11/2017  . Chronic respiratory failure with hypoxia ([place]) 03/30/2017  . Angular cheilitis 03/08/2017  . Leg pain 10/25/2016  . Back pain 08/05/2016  . RA (rheumatoid arthritis) ([place]) 03/31/2015  . Allergic rhinitis   . Varicose vein 09/11/2014  . ILD (interstitial lung disease) ([place]) 06/25/2012  . Chest pain 02/27/2012  . CAD (coronary artery disease) 02/27/2012  . Pruritus 08/18/2009  . Postinflammatory pulmonary fibrosis / RA ILD  06/30/2008  . Constipation 01/03/2008  . Dyslipidemia 06/16/2007    Past Surgical History:  Procedure Laterality Date  . ABDOMINAL HYSTERECTOMY  1974  . APPENDECTOMY  1959  . CATARACT EXTRACTION    . LAPAROTOMY N/A 08/23/2020   Procedure: EXPLORATORY LAPAROTOMY small bowel resection gastrostomy tube placement;  Surgeon: Dwan Bolt, MD;  Location: [facility] ORS;  Service: General;  Laterality: N/A;  . LEFT HEART CATHETERIZATION WITH CORONARY ANGIOGRAM N/A 06/18/2013  Procedure: LEFT HEART CATHETERIZATION WITH CORONARY ANGIOGRAM;  Surgeon: Peter M Martinique, MD;  Location: [facility] CATH LAB;  Service: Cardiovascular;  Laterality: N/A;  . TONSILLECTOMY  1941, 1951  . TOTAL KNEE ARTHROPLASTY  1997, 2007   Bilateral     OB History    Gravida  4   Para  2   Term  2   Preterm      AB      Living  0     SAB      IAB      Ectopic      Multiple      Live Births              Family  History  Problem Relation Age of Onset  . Kidney disease Daughter 5  . Cancer Daughter   . Heart attack Father 41  . Alzheimer's disease Sister   . Heart disease Sister   . Alzheimer's disease Sister     Social History   Tobacco Use  . Smoking status: Former Smoker    Packs/day: 0.50    Years: 20.00    Pack years: 10.00    Types: Cigarettes    Quit date: 07/11/1973    Years since quitting: 47.2  . Smokeless tobacco: Never Used  Vaping Use  . Vaping Use: Never used  Substance Use Topics  . Alcohol use: No  . Drug use: No    Home Medications Prior to Admission medications   Medication Sig Start Date End Date Taking? Authorizing Provider  famotidine (PEPCID) 20 MG tablet Take 1 tablet (20 mg total) by mouth daily. Patient taking differently: Take 20 mg by mouth daily as needed for heartburn or indigestion. 09/08/20  Yes Hosie Poisson, MD  fluticasone (FLONASE) 50 MCG/ACT nasal spray Place 2 sprays into both nostrils daily as needed for allergies or rhinitis. 03/17/15  Yes Hoyt Koch, MD  furosemide (LASIX) 20 MG tablet Take 1 tablet by mouth daily as needed for swelling. Patient taking differently: Take 20 mg by mouth daily as needed for fluid or edema. 12/11/18  Yes Burtis Junes, NP  HYDROcodone-acetaminophen (NORCO/VICODIN) 5-325 MG tablet Take 1 tablet by mouth in the morning and at bedtime. 09/23/20  Yes [provider]  InFLIXimab (REMICADE IV) Inject into the vein as directed. Every 2 months   Yes [provider]  lipase/protease/amylase (CREON) 36000 UNITS CPEP capsule Take 1 capsule (36,000 Units total) by mouth 3 (three) times daily before meals. 10/09/20  Yes Hoyt Koch, MD  mupirocin ointment (BACTROBAN) 2 % Apply 1 application topically 2 (two) times daily. 09/29/20  Yes Hoyt Koch, MD  mycophenolate (CELLCEPT) 500 MG tablet 2 tabs bid Patient taking differently: Take 1,000 mg by mouth 2 (two) times daily. 01/27/20  Yes Charlynne Cousins, MD  Nintedanib (OFEV) 100 MG CAPS Take 1 capsule (100 mg total) by mouth 2 (two) times daily. 08/18/20  Yes Mannam, Praveen, MD  nitroGLYCERIN (NITROSTAT) 0.4 MG SL tablet Place 0.4 mg under the tongue every 5 (five) minutes as needed for chest pain.   Yes [provider]  Nutritional Supplements (,FEEDING SUPPLEMENT, PROSOURCE PLUS) liquid Take 30 mLs by mouth 2 (two) times daily between meals. 09/07/20  Yes Hosie Poisson, MD  potassium chloride (KLOR-CON) 10 MEQ tablet Take 20 Meq twice daily x 2 days and then 20 Meq daily Patient taking differently: Take 10 mEq by mouth 2 (two) times daily. 09/29/20  Yes Billey Gosling  J, MD  predniSONE (DELTASONE) 5 MG tablet TAKE 1 TABLET DAILY WITH BREAKFAST Patient taking differently: Take 5 mg by mouth daily with breakfast. 05/08/20  Yes Mannam, Praveen, MD  nitroGLYCERIN (NITROSTAT) 0.4 MG SL tablet Place 1 tablet (0.4 mg total) under the tongue every 5 (five) minutes as needed for chest pain. 06/29/20 09/27/20  Burnell Blanks, MD    Allergies    Crestor [rosuvastatin], Lactose intolerance (gi), Penicillins, and Imuran [azathioprine]  Review of Systems   Review of Systems  Constitutional:       Per HPI, otherwise negative  HENT:       Per HPI, otherwise negative  Respiratory:       Per HPI, otherwise negative  Cardiovascular:       Per HPI, otherwise negative  Gastrointestinal: Positive for abdominal pain and nausea. Negative for vomiting.  Endocrine:       Negative aside from HPI  Genitourinary:       Neg aside from HPI   Musculoskeletal:       Per HPI, otherwise negative  Skin: Negative.   Neurological: Positive for weakness. Negative for syncope.    Physical Exam Updated Vital Signs BP 134/88   Pulse 99   Temp 98.8 F (37.1 C) (Oral)   Resp 20   Wt 39.9 kg   SpO2 99%   BMI 14.87 kg/m   Physical Exam Vitals and nursing note reviewed.  Constitutional:      Appearance: She is ill-appearing.      Comments: Sickly appearing elderly female awake and alert speaking clearly  HENT:     Head: Normocephalic and atraumatic.  Eyes:     Conjunctiva/sclera: Conjunctivae normal.  Cardiovascular:     Rate and Rhythm: Normal rate and regular rhythm.  Pulmonary:     Effort: Pulmonary effort is normal. No respiratory distress.     Breath sounds: Normal breath sounds. No stridor.  Abdominal:     General: There is no distension.     Tenderness: There is abdominal tenderness in the left lower quadrant.     Comments: There are 2 surgical wounds, both open, and left lower quadrant with surrounding erythema, no purulence, no malodorous discharge, no active bleeding.  Skin:    General: Skin is warm and dry.  Neurological:     Mental Status: She is alert and oriented to person, place, and time.     Cranial Nerves: No cranial nerve deficit.     ED Results / Procedures / Treatments   Labs (all labs ordered are listed, but only abnormal results are displayed) Labs Reviewed  COMPREHENSIVE METABOLIC PANEL - Abnormal; Notable for the following components:      Result Value   Sodium 134 (*)    Calcium 8.8 (*)    Albumin 2.8 (*)    All other components within normal limits  CBC WITH DIFFERENTIAL/PLATELET - Abnormal; Notable for the following components:   WBC 14.6 (*)    RDW 19.8 (*)    Lymphs Abs 7.2 (*)    Monocytes Absolute 1.1 (*)    All other components within normal limits  URINALYSIS, ROUTINE W REFLEX MICROSCOPIC - Abnormal; Notable for the following components:   Specific Gravity, Urine >1.046 (*)    Hgb urine dipstick SMALL (*)    Ketones, ur 5 (*)    Bacteria, UA RARE (*)    All other components within normal limits  AEROBIC/ANAEROBIC CULTURE W GRAM STAIN (SURGICAL/DEEP WOUND)  LIPASE, BLOOD  EKG EKG Interpretation  Date/Time:  Friday October 16 2020 16:48:43 EDT Ventricular Rate:  98 PR Interval:  177 QRS Duration: 77 QT Interval:  330 QTC Calculation: 422 R  Axis:   45 Text Interpretation: Sinus rhythm Left atrial enlargement Low voltage, extremity leads ST-t wave abnormality Abnormal ECG Confirmed by Carmin Muskrat [phone]) on 10/16/2020 5:27:36 PM   Radiology CT ABDOMEN PELVIS W CONTRAST  Result Date: 10/16/2020 CLINICAL DATA:  abd pain worsening since "abdominal surgery in feb". Also c/o back pain. Abdominal abscess/infection suspected L open abd s/p surgery now w worsening pain. Exploratory laparotomy in February 2022. EXAM: CT ABDOMEN AND PELVIS WITH CONTRAST TECHNIQUE: Multidetector CT imaging of the abdomen and pelvis was performed using the standard protocol following bolus administration of intravenous contrast. CONTRAST:  19mL OMNIPAQUE IOHEXOL 300 MG/ML  SOLN COMPARISON:  CT abdomen pelvis 09/30/2020, CT abdomen pelvis 09/04/2020, CT abdomen pelvis 08/17/2020 FINDINGS: Lower chest: Persistent extensive bibasilar interstitial lung changes consistent with UIP. Hepatobiliary: No focal liver abnormality. No gallstones, gallbladder wall thickening, or pericholecystic fluid. Similar-appearing common bile duct dilatation measuring up to 1.9 cm. No intrahepatic biliary ductal dilatation. Pancreas: No focal lesion. Normal pancreatic contour. No surrounding inflammatory changes. No main pancreatic ductal dilatation. Spleen: Normal in size without focal abnormality. Adrenals/Urinary Tract: No adrenal nodule bilaterally. Bilateral kidneys enhance symmetrically. No hydronephrosis. No hydroureter. The urinary bladder is unremarkable. Stomach/Bowel: Stomach is within normal limits. No evidence of bowel wall thickening or dilatation. Scattered colonic diverticulosis. Under distended rectum. Status post appendectomy. Vascular/Lymphatic: No abdominal aorta or iliac aneurysm. Moderate atherosclerotic plaque of the aorta and its branches. No abdominal, pelvic, or inguinal lymphadenopathy. Reproductive: Status post hysterectomy. No adnexal masses. Other: No intraperitoneal  free fluid. No intraperitoneal free gas. No organized fluid collection. Musculoskeletal: Redemonstration of superficial dehiscence of the laparotomy incision (2:37-60). No suspicious lytic or blastic osseous lesions. No acute displaced fracture. Multilevel degenerative changes of the spine. IMPRESSION: 1. Persistent extensive bibasilar interstitial lung changes consistent with UIP. 2. Persistent common bile duct dilatation. If clinically indicated, consider MRCP and MRI abdomen with contrast for further evaluation. 3. Persistent superficial dehiscence of the laparotomy incision. 4. Scattered colonic diverticulosis with no acute diverticulitis. 5. Aortic Atherosclerosis (ICD10-I70.0). Electronically Signed   By: Iven Finn M.D.   On: 10/16/2020 19:15    Procedures Procedures   Medications Ordered in ED Medications  0.9 %  sodium chloride infusion ( Intravenous New Bag/Given 10/16/20 1724)  fentaNYL (SUBLIMAZE) injection 50 mcg (50 mcg Intravenous Given 10/16/20 1724)  ondansetron (ZOFRAN) injection 4 mg (4 mg Intravenous Given 10/16/20 1724)  iohexol (OMNIPAQUE) 300 MG/ML solution 100 mL (75 mLs Intravenous Contrast Given 10/16/20 1831)    ED Course  I have reviewed the triage vital signs and the nursing notes.  Pertinent labs & imaging results that were available during my care of the patient were reviewed by me and considered in my medical decision making (see chart for details).   On repeat exam the patient is calm, feeling better, apparently.  She is now accompanied by a caregiver.  Update:, Patient is calm, there is been a delay in obtaining labs.  11:20 PM Patient remains unremarkable, hemodynamically stable.  No notable changes. Now, after substantial delay, urinalysis results are available. Urinalysis notable for dehydration.  Line patient received fluid resuscitation on arrival, and again I reviewed lab findings with the patient's caregiver. Without evidence for bacteremia, sepsis,  with a generally unremarkable CT scan of her abdomen, to have reviewed several  times, patient is appropriate for follow-up with her outpatient physicians. MDM Rules/Calculators/A&P MDM Number of Diagnoses or Management Options Generalized abdominal pain: new, needed workup   Amount and/or Complexity of Data Reviewed Clinical lab tests: reviewed and ordered Tests in the radiology section of CPT: ordered and reviewed Tests in the medicine section of CPT: ordered and reviewed Decide to obtain previous medical records or to obtain history from someone other than the patient: yes Obtain history from someone other than the patient: yes Review and summarize past medical records: yes Discuss the patient with other providers: yes Independent visualization of images, tracings, or specimens: yes  Risk of Complications, Morbidity, and/or Mortality Presenting problems: high Diagnostic procedures: high Management options: high  Critical Care Total time providing critical care: < 30 minutes  Patient Progress Patient progress: stable   Final Clinical Impression(s) / ED Diagnoses Final diagnoses:  Generalized abdominal pain     Carmin Muskrat, MD 10/16/20 2321

## 2020-10-16 NOTE — Telephone Encounter (Signed)
Unable to get in contact with [name] [name] from [place]. LVM giving verbal orders.

## 2020-10-16 NOTE — Telephone Encounter (Signed)
Fine

## 2020-10-16 NOTE — Telephone Encounter (Signed)
Spoke to patient

## 2020-10-19 ENCOUNTER — Telehealth (INDEPENDENT_AMBULATORY_CARE_PROVIDER_SITE_OTHER): Payer: Medicare Other | Admitting: Internal Medicine

## 2020-10-19 ENCOUNTER — Encounter: Payer: Self-pay | Admitting: Internal Medicine

## 2020-10-19 DIAGNOSIS — I5032 Chronic diastolic (congestive) heart failure: Secondary | ICD-10-CM | POA: Diagnosis not present

## 2020-10-19 DIAGNOSIS — E43 Unspecified severe protein-calorie malnutrition: Secondary | ICD-10-CM | POA: Diagnosis not present

## 2020-10-19 DIAGNOSIS — Z7982 Long term (current) use of aspirin: Secondary | ICD-10-CM | POA: Diagnosis not present

## 2020-10-19 DIAGNOSIS — T8131XA Disruption of external operation (surgical) wound, not elsewhere classified, initial encounter: Secondary | ICD-10-CM | POA: Diagnosis not present

## 2020-10-19 DIAGNOSIS — I2510 Atherosclerotic heart disease of native coronary artery without angina pectoris: Secondary | ICD-10-CM | POA: Diagnosis not present

## 2020-10-19 DIAGNOSIS — R1084 Generalized abdominal pain: Secondary | ICD-10-CM

## 2020-10-19 DIAGNOSIS — M069 Rheumatoid arthritis, unspecified: Secondary | ICD-10-CM | POA: Diagnosis not present

## 2020-10-19 MED ORDER — HYDROCODONE-ACETAMINOPHEN 10-325 MG PO TABS: 1.0000 | ORAL_TABLET | Freq: Two times a day (BID) | ORAL | 0 refills | Status: DC | PRN

## 2020-10-19 MED ORDER — CEPHALEXIN 500 MG PO CAPS: 500.0000 mg | ORAL_CAPSULE | Freq: Two times a day (BID) | ORAL | 0 refills | Status: DC

## 2020-10-19 NOTE — Telephone Encounter (Signed)
LVM with verbal orders. Office number was provided in case of additional questions or concerns.

## 2020-10-19 NOTE — Progress Notes (Signed)
[name] [name], [name] [name] ([id number]) Visit Report for 10/16/2020 Allergy List Details Patient Name: Date of Service: [name] [name]. 10/16/2020 7:30 A M Medical Record Number: [id number] Patient Account Number: [id number] Date of Birth/Sex: Treating RN: 18-Nov-1930 (87 y.o. Female) Baruch Gouty Primary Care Madyson Lukach: Pricilla Holm Other Clinician: Referring Renay Crammer: Treating Emiyah Spraggins/Extender: Charlie Pitter in Treatment: 0 Allergies Active Allergies Crestor Reaction: unknown lactose Reaction: intolerance penicillin Reaction: hives Imuran Reaction: nausea/vomiting Allergy Notes Electronic Signature(s) Signed: 10/16/2020 5:00:24 PM By: Baruch Gouty RN, BSN Entered By: Baruch Gouty on 10/16/2020 08:04:33 -------------------------------------------------------------------------------- Arrival Information Details Patient Name: Date of Service: [name] [name]. 10/16/2020 7:30 A M Medical Record Number: [id number] Patient Account Number: [id number] Date of Birth/Sex: Treating RN: Jan 20, 1931 (87 y.o. Female) Baruch Gouty Primary Care Leelan Rajewski: Pricilla Holm Other Clinician: Referring Keyron Pokorski: Treating Madie Cahn/Extender: Charlie Pitter in Treatment: 0 Visit Information Patient Arrived: Wheel Chair Arrival Time: 07:49 Accompanied By: caregiver, sister in law Transfer Assistance: None Patient Identification Verified: Yes Secondary Verification Process Completed: Yes Patient Requires Transmission-Based Precautions: No Patient Has Alerts: No Electronic Signature(s) Signed: 10/16/2020 5:00:24 PM By: Baruch Gouty RN, BSN Entered By: Baruch Gouty on 10/16/2020 07:56:52 -------------------------------------------------------------------------------- Clinic Level of Care Assessment Details Patient Name: Date of Service: [name] [name]. 10/16/2020 7:30 A M Medical Record Number: [id number] Patient Account Number:  [id number] Date of Birth/Sex: Treating RN: Apr 25, 1931 (87 y.o. Female) Baruch Gouty Primary Care Emet Rafanan: Pricilla Holm Other Clinician: Referring Azeneth Carbonell: Treating Kennadi Albany/Extender: Verlene Mayer in Treatment: 0 Clinic Level of Care Assessment Items TOOL 2 Quantity Score []  - 0 Use when only an EandM is performed on the INITIAL visit ASSESSMENTS - Nursing Assessment / Reassessment X- 1 20 General Physical Exam (combine w/ comprehensive assessment (listed just below) when performed on new pt. evals) X- 1 25 Comprehensive Assessment (HX, ROS, Risk Assessments, Wounds Hx, etc.) ASSESSMENTS - Wound and Skin A ssessment / Reassessment []  - 0 Simple Wound Assessment / Reassessment - one wound X- 4 5 Complex Wound Assessment / Reassessment - multiple wounds []  - 0 Dermatologic / Skin Assessment (not related to wound area) ASSESSMENTS - Ostomy and/or Continence Assessment and Care []  - 0 Incontinence Assessment and Management []  - 0 Ostomy Care Assessment and Management (repouching, etc.) PROCESS - Coordination of Care X - Simple Patient / Family Education for ongoing care 1 15 []  - 0 Complex (extensive) Patient / Family Education for ongoing care X- 1 10 Staff obtains Programmer, systems, Records, T Results / Process Orders est X- 1 10 Staff telephones HHA, Nursing Homes / Clarify orders / etc []  - 0 Routine Transfer to another Facility (non-emergent condition) []  - 0 Routine Hospital Admission (non-emergent condition) X- 1 15 New Admissions / Biomedical engineer / Ordering NPWT Apligraf, etc. , []  - 0 Emergency Hospital Admission (emergent condition) X- 1 10 Simple Discharge Coordination []  - 0 Complex (extensive) Discharge Coordination PROCESS - Special Needs []  - 0 Pediatric / Minor Patient Management []  - 0 Isolation Patient Management []  - 0 Hearing / Language / Visual special needs []  - 0 Assessment of Community assistance  (transportation, D/C planning, etc.) []  - 0 Additional assistance / Altered mentation []  - 0 Support Surface(s) Assessment (bed, cushion, seat, etc.) INTERVENTIONS - Wound Cleansing / Measurement X- 1 5 Wound Imaging (photographs - any number of wounds) []  - 0 Wound Tracing (instead of photographs) []  - 0 Simple Wound Measurement - one wound X- 4 5  Complex Wound Measurement - multiple wounds []  - 0 Simple Wound Cleansing - one wound X- 4 5 Complex Wound Cleansing - multiple wounds INTERVENTIONS - Wound Dressings X - Small Wound Dressing one or multiple wounds 4 10 []  - 0 Medium Wound Dressing one or multiple wounds []  - 0 Large Wound Dressing one or multiple wounds []  - 0 Application of Medications - injection INTERVENTIONS - Miscellaneous []  - 0 External ear exam []  - 0 Specimen Collection (cultures, biopsies, blood, body fluids, etc.) []  - 0 Specimen(s) / Culture(s) sent or taken to Lab for analysis []  - 0 Patient Transfer (multiple staff / Civil Service fast streamer / Similar devices) []  - 0 Simple Staple / Suture removal (25 or less) []  - 0 Complex Staple / Suture removal (26 or more) []  - 0 Hypo / Hyperglycemic Management (close monitor of Blood Glucose) []  - 0 Ankle / Brachial Index (ABI) - do not check if billed separately Has the patient been seen at the hospital within the last three years: Yes Total Score: 210 Level Of Care: New/Established - Level 5 Electronic Signature(s) Signed: 10/16/2020 5:00:24 PM By: Baruch Gouty RN, BSN Entered By: Baruch Gouty on 10/16/2020 09:32:40 -------------------------------------------------------------------------------- Lower Extremity Assessment Details Patient Name: Date of Service: [name] [name]. 10/16/2020 7:30 A M Medical Record Number: [id number] Patient Account Number: [id number] Date of Birth/Sex: Treating RN: 22-Jul-1930 (87 y.o. Female) Baruch Gouty Primary Care Sladen Plancarte: Pricilla Holm Other  Clinician: Referring Burgess Sheriff: Treating Caylin Raby/Extender: Verlene Mayer in Treatment: 0 Electronic Signature(s) Signed: 10/16/2020 5:00:24 PM By: Baruch Gouty RN, BSN Entered By: Baruch Gouty on 10/16/2020 08:35:41 -------------------------------------------------------------------------------- Multi Wound Chart Details Patient Name: Date of Service: [name] [name]. 10/16/2020 7:30 A M Medical Record Number: [id number] Patient Account Number: [id number] Date of Birth/Sex: Treating RN: 11-08-1930 (87 y.o. Female) Lorrin Jackson Primary Care Nyela Cortinas: Pricilla Holm Other Clinician: Referring Rafaelita Foister: Treating Roscoe Witts/Extender: Verlene Mayer in Treatment: 0 Vital Signs Height(in): 48 Pulse(bpm): 118 Weight(lbs): 30 Blood Pressure(mmHg): 104/74 Body Mass Index(BMI): 17 Temperature(F): 97.7 Respiratory Rate(breaths/min): 18 Photos: [1:No Photos Left Gluteus] [2:No Photos Abdomen - midline] [3:No Photos Superior Abdomen - midline] Wound Location: [1:Pressure Injury] [2:Surgical Injury] [3:Surgical Injury] Wounding Event: [1:Pressure Ulcer] [2:Open Surgical Wound] [3:Open Surgical Wound] Primary Etiology: [1:Cataracts, Anemia, Congestive Heart Cataracts, Anemia, Congestive Heart Cataracts, Anemia, Congestive Heart] Comorbid History: [1:Failure, Coronary Artery Disease, Peripheral Venous Disease, Rheumatoid Arthritis 08/11/2020] [2:Failure, Coronary Artery Disease, Peripheral Venous Disease, Rheumatoid Arthritis 08/23/2020] [3:Failure, Coronary Artery Disease,  Peripheral Venous Disease, Rheumatoid Arthritis 08/23/2020] Date Acquired: [1:0] [2:0] [3:0] Weeks of Treatment: [1:Open] [2:Open] [3:Open] Wound Status: [1:0.3x1.1x0.1] [2:7x403x0.8] [3:2x1.8x1.3] Measurements L x W x D (cm) [1:0.259] [2:2215.608] [3:2.827] A (cm) : rea [1:0.026] [2:1772.487] [3:3.676] Volume (cm) : [2:6] [3:8] Starting Position 1 (o'clock):  [2:12] [3:11] Ending Position 1 (o'clock): [2:2.9] [3:2.7] Maximum Distance 1 (cm): [1:No] [2:Yes] [3:Yes] Undermining: [1:Category/Stage II] [2:Full Thickness With Exposed Support Full Thickness With Exposed Support] Classification: [1:Small] [2:Structures Large] [3:Structures Medium] Exudate Amount: [1:Serosanguineous] [2:Serosanguineous] [3:Serous] Exudate Type: [1:red, brown] [2:red, brown] [3:amber] Exudate Color: [1:Flat and Intact] [2:Well defined, not attached] [3:Well defined, not attached] Wound Margin: [1:Large (67-100%)] [2:Medium (34-66%)] [3:Medium (34-66%)] Granulation Amount: [1:Red] [2:Red] [3:Red] Granulation Quality: [1:None Present (0%)] [2:Medium (34-66%)] [3:Medium (34-66%)] Necrotic Amount: [1:Fascia: No] [2:Fat Layer (Subcutaneous Tissue): Yes Fat Layer (Subcutaneous Tissue): Yes] Exposed Structures: [1:Fat Layer (Subcutaneous Tissue): No Muscle: Yes Tendon: No Muscle: No Joint: No Bone: No Limited to Skin Breakdown Medium (34-66%)] [2:Fascia:  No Tendon: No Joint: No Bone: No None] [3:Muscle: Yes Fascia: No Tendon: No Joint: No Bone: No None] Wound Number: 4 N/A N/A Photos: No Photos N/A N/A Left, Lateral Abdomen - Upper N/A N/A Wound Location: Quadrant Surgical Injury N/A N/A Wounding Event: Open Surgical Wound N/A N/A Primary Etiology: Cataracts, Anemia, Congestive Heart N/A N/A Comorbid History: Failure, Coronary Artery Disease, Peripheral Venous Disease, Rheumatoid Arthritis 09/08/2020 N/A N/A Date Acquired: 0 N/A N/A Weeks of Treatment: Open N/A N/A Wound Status: 0.2x0.4x2.6 N/A N/A Measurements L x W x D (cm) 0.063 N/A N/A A (cm) : rea 0.163 N/A N/A Volume (cm) : No N/A N/A Undermining: Full Thickness Without Exposed N/A N/A Classification: Support Structures Large N/A N/A Exudate Amount: Purulent N/A N/A Exudate Type: yellow, brown, green N/A N/A Exudate Color: Well defined, not attached N/A N/A Wound Margin: Small (1-33%) N/A  N/A Granulation Amount: Red N/A N/A Granulation Quality: None Present (0%) N/A N/A Necrotic Amount: Fat Layer (Subcutaneous Tissue): Yes N/A N/A Exposed Structures: Fascia: No Tendon: No Muscle: No Joint: No Bone: No None N/A N/A Epithelialization: Treatment Notes Electronic Signature(s) Signed: 10/16/2020 2:09:09 PM By: Kalman Shan DO Signed: 10/16/2020 5:03:53 PM By: Lorrin Jackson Entered By: Kalman Shan on 10/16/2020 13:56:21 -------------------------------------------------------------------------------- Multi-Disciplinary Care Plan Details Patient Name: Date of Service: [name] [name]. 10/16/2020 7:30 A M Medical Record Number: [id number] Patient Account Number: [id number] Date of Birth/Sex: Treating RN: Mar 19, 1931 (87 y.o. Female) Baruch Gouty Primary Care Kenric Ginger: Pricilla Holm Other Clinician: Referring Nancie Bocanegra: Treating Florence Antonelli/Extender: Verlene Mayer in Treatment: 0 Multidisciplinary Care Plan reviewed with physician Active Inactive Abuse / Safety / Falls / Self Care Management Nursing Diagnoses: History of Falls Potential for falls Goals: Patient/caregiver will verbalize/demonstrate measures taken to prevent injury and/or falls Date Initiated: 10/16/2020 Target Resolution Date: 11/13/2020 Goal Status: Active Interventions: Assess fall risk on admission and as needed Assess: immobility, friction, shearing, incontinence upon admission and as needed Notes: Nutrition Nursing Diagnoses: Imbalanced nutrition Goals: Patient/caregiver agrees to and verbalizes understanding of need to use nutritional supplements and/or vitamins as prescribed Date Initiated: 10/16/2020 Target Resolution Date: 11/13/2020 Goal Status: Active Interventions: Assess patient nutrition upon admission and as needed per policy Provide education on nutrition Treatment Activities: Patient referred to Primary Care Physician for further  nutritional evaluation : 10/16/2020 Notes: Wound/Skin Impairment Nursing Diagnoses: Impaired tissue integrity Knowledge deficit related to ulceration/compromised skin integrity Goals: Patient/caregiver will verbalize understanding of skin care regimen Date Initiated: 10/16/2020 Target Resolution Date: 11/13/2020 Goal Status: Active Ulcer/skin breakdown will have a volume reduction of 30% by week 4 Date Initiated: 10/16/2020 Target Resolution Date: 11/13/2020 Goal Status: Active Interventions: Assess patient/caregiver ability to obtain necessary supplies Assess patient/caregiver ability to perform ulcer/skin care regimen upon admission and as needed Assess ulceration(s) every visit Provide education on ulcer and skin care Treatment Activities: Skin care regimen initiated : 10/16/2020 Topical wound management initiated : 10/16/2020 Notes: Electronic Signature(s) Signed: 10/16/2020 5:00:24 PM By: Baruch Gouty RN, BSN Entered By: Baruch Gouty on 10/16/2020 09:21:19 -------------------------------------------------------------------------------- Pain Assessment Details Patient Name: Date of Service: [name] [name]. 10/16/2020 7:30 A M Medical Record Number: [id number] Patient Account Number: [id number] Date of Birth/Sex: Treating RN: 11/06/1930 (87 y.o. Female) Baruch Gouty Primary Care Cobin Cadavid: Pricilla Holm Other Clinician: Referring Jhoselin Crume: Treating Raegan Sipp/Extender: Verlene Mayer in Treatment: 0 Active Problems Location of Pain Severity and Description of Pain Patient Has Paino Yes Site Locations Pain Location: Pain in Ulcers With Dressing Change:  Yes Duration of the Pain. Constant / Intermittento Constant Rate the pain. Current Pain Level: 9 Worst Pain Level: 10 Least Pain Level: 2 Character of Pain Describe the Pain: Aching Pain Management and Medication Current Pain Management: Medication: Yes Is the Current Pain Management  Adequate: Adequate Rest: Yes How does your wound impact your activities of daily livingo Sleep: Yes Bathing: No Appetite: No Relationship With Others: No Bladder Continence: No Emotions: Yes Bowel Continence: No Hobbies: No Toileting: No Dressing: No Electronic Signature(s) Signed: 10/16/2020 5:00:24 PM By: Baruch Gouty RN, BSN Entered By: Baruch Gouty on 10/16/2020 08:52:27 -------------------------------------------------------------------------------- Patient/Caregiver Education Details Patient Name: Date of Service: [name] [name] 4/8/2022andnbsp7:30 A M Medical Record Number: [id number] Patient Account Number: [id number] Date of Birth/Gender: Treating RN: 1930-11-17 (87 y.o. Female) Baruch Gouty Primary Care Physician: Pricilla Holm Other Clinician: Referring Physician: Treating Physician/Extender: Verlene Mayer in Treatment: 0 Education Assessment Education Provided To: Patient Education Topics Provided Infection: Methods: Explain/Verbal Responses: Reinforcements needed, State content correctly Nutrition: Handouts: Nutrition Methods: Explain/Verbal, Printed Responses: Reinforcements needed, State content correctly [place]: o Handouts: Welcome T [facility] o Methods: Explain/Verbal, Printed Responses: Reinforcements needed, State content correctly Wound/Skin Impairment: Handouts: Caring for Your Ulcer, Skin Care Do's and Dont's Methods: Explain/Verbal, Printed Responses: Reinforcements needed, State content correctly Electronic Signature(s) Signed: 10/16/2020 5:00:24 PM By: Baruch Gouty RN, BSN Entered By: Baruch Gouty on 10/16/2020 09:22:05 -------------------------------------------------------------------------------- Wound Assessment Details Patient Name: Date of Service: [name] [name]. 10/16/2020 7:30 A M Medical Record Number: [id number] Patient Account Number:  [id number] Date of Birth/Sex: Treating RN: 09-01-1930 (87 y.o. Female) Baruch Gouty Primary Care Lexine Jaspers: Pricilla Holm Other Clinician: Referring Shown Dissinger: Treating Manveer Gomes/Extender: Verlene Mayer in Treatment: 0 Wound Status Wound Number: 1 Primary Pressure Ulcer Etiology: Wound Location: Left Gluteus Wound Open Wounding Event: Pressure Injury Status: Date Acquired: 08/11/2020 Comorbid Cataracts, Anemia, Congestive Heart Failure, Coronary Artery Weeks Of Treatment: 0 History: Disease, Peripheral Venous Disease, Rheumatoid Arthritis Clustered Wound: No Photos Wound Measurements Length: (cm) 0.3 Width: (cm) 1.1 Depth: (cm) 0.1 Area: (cm) 0.259 Volume: (cm) 0.026 % Reduction in Area: 0% % Reduction in Volume: 0% Epithelialization: Medium (34-66%) Tunneling: No Undermining: No Wound Description Classification: Category/Stage II Wound Margin: Flat and Intact Exudate Amount: Small Exudate Type: Serosanguineous Exudate Color: red, brown Foul Odor After Cleansing: No Slough/Fibrino No Wound Bed Granulation Amount: Large (67-100%) Exposed Structure Granulation Quality: Red Fascia Exposed: No Necrotic Amount: None Present (0%) Fat Layer (Subcutaneous Tissue) Exposed: No Tendon Exposed: No Muscle Exposed: No Joint Exposed: No Bone Exposed: No Limited to Skin Breakdown Electronic Signature(s) Signed: 10/16/2020 5:00:24 PM By: Baruch Gouty RN, BSN Signed: 10/19/2020 7:58:59 AM By: Sandre Kitty Entered By: Sandre Kitty on 10/16/2020 16:51:23 -------------------------------------------------------------------------------- Wound Assessment Details Patient Name: Date of Service: [name] [name]. 10/16/2020 7:30 A M Medical Record Number: [id number] Patient Account Number: [id number] Date of Birth/Sex: Treating RN: 04/29/1931 (87 y.o. Female) Baruch Gouty Primary Care Jeovani Weisenburger: Pricilla Holm Other  Clinician: Referring Irais Mottram: Treating Raynell Scott/Extender: Verlene Mayer in Treatment: 0 Wound Status Wound Number: 2 Primary Open Surgical Wound Etiology: Wound Location: Abdomen - midline Wound Open Wounding Event: Surgical Injury Status: Date Acquired: 08/23/2020 Comorbid Cataracts, Anemia, Congestive Heart Failure, Coronary Artery Weeks Of Treatment: 0 History: Disease, Peripheral Venous Disease, Rheumatoid Arthritis Clustered Wound: No Photos Wound Measurements Length: (cm) 7 Width: (cm) 4.3 Depth: (cm) 0.8 Area: (cm) 23.64 Volume: (  cm) 18.912 % Reduction in Area: 0% % Reduction in Volume: 0% Epithelialization: None Tunneling: No Undermining: Yes Starting Position (o'clock): 6 Ending Position (o'clock): 12 Maximum Distance: (cm) 2.9 Wound Description Classification: Full Thickness With Exposed Support Structures Wound Margin: Well defined, not attached Exudate Amount: Large Exudate Type: Serosanguineous Exudate Color: red, brown Foul Odor After Cleansing: No Slough/Fibrino Yes Wound Bed Granulation Amount: Medium (34-66%) Exposed Structure Granulation Quality: Red Fascia Exposed: No Necrotic Amount: Medium (34-66%) Fat Layer (Subcutaneous Tissue) Exposed: Yes Tendon Exposed: No Muscle Exposed: Yes Necrosis of Muscle: No Joint Exposed: No Bone Exposed: No Electronic Signature(s) Signed: 10/16/2020 5:00:24 PM By: Baruch Gouty RN, BSN Signed: 10/19/2020 7:58:59 AM By: Sandre Kitty Entered By: Sandre Kitty on 10/16/2020 16:52:45 -------------------------------------------------------------------------------- Wound Assessment Details Patient Name: Date of Service: [name] [name]. 10/16/2020 7:30 A M Medical Record Number: [id number] Patient Account Number: [id number] Date of Birth/Sex: Treating RN: 1930-12-23 (87 y.o. Female) Baruch Gouty Primary Care Holmes Hays: Pricilla Holm Other Clinician: Referring  Neftali Thurow: Treating Briante Loveall/Extender: Verlene Mayer in Treatment: 0 Wound Status Wound Number: 3 Primary Open Surgical Wound Etiology: Wound Location: Superior Abdomen - midline Wound Open Wounding Event: Surgical Injury Status: Date Acquired: 08/23/2020 Comorbid Cataracts, Anemia, Congestive Heart Failure, Coronary Artery Weeks Of Treatment: 0 History: Disease, Peripheral Venous Disease, Rheumatoid Arthritis Clustered Wound: No Photos Wound Measurements Length: (cm) 2 Width: (cm) 1.8 Depth: (cm) 1.3 Area: (cm) 2.827 Volume: (cm) 3.676 % Reduction in Area: 0% % Reduction in Volume: 0% Epithelialization: None Tunneling: No Undermining: Yes Starting Position (o'clock): 8 Ending Position (o'clock): 11 Maximum Distance: (cm) 2.7 Wound Description Classification: Full Thickness With Exposed Support Structures Wound Margin: Well defined, not attached Exudate Amount: Medium Exudate Type: Serous Exudate Color: amber Foul Odor After Cleansing: No Slough/Fibrino Yes Wound Bed Granulation Amount: Medium (34-66%) Exposed Structure Granulation Quality: Red Fascia Exposed: No Necrotic Amount: Medium (34-66%) Fat Layer (Subcutaneous Tissue) Exposed: Yes Necrotic Quality: Adherent Slough Tendon Exposed: No Muscle Exposed: Yes Necrosis of Muscle: No Joint Exposed: No Bone Exposed: No Electronic Signature(s) Signed: 10/16/2020 5:00:24 PM By: Baruch Gouty RN, BSN Signed: 10/19/2020 7:58:59 AM By: Sandre Kitty Entered By: Sandre Kitty on 10/16/2020 16:51:46 -------------------------------------------------------------------------------- Wound Assessment Details Patient Name: Date of Service: [name] [name]. 10/16/2020 7:30 A M Medical Record Number: [id number] Patient Account Number: [id number] Date of Birth/Sex: Treating RN: [birth year] (87 y.o. Female) Baruch Gouty Primary Care Venezia Sargeant: Pricilla Holm Other  Clinician: Referring Caidin Heidenreich: Treating Uliana Brinker/Extender: Verlene Mayer in Treatment: 0 Wound Status Wound Number: 4 Primary Open Surgical Wound Etiology: Wound Location: Left, Lateral Abdomen - Upper Quadrant Wound Open Wounding Event: Surgical Injury Status: Date Acquired: 09/08/2020 Comorbid Cataracts, Anemia, Congestive Heart Failure, Coronary Artery Weeks Of Treatment: 0 History: Disease, Peripheral Venous Disease, Rheumatoid Arthritis Clustered Wound: No Photos Wound Measurements Length: (cm) 0.2 Width: (cm) 0.4 Depth: (cm) 2.6 Area: (cm) 0.063 Volume: (cm) 0.163 % Reduction in Area: 0% % Reduction in Volume: 0% Epithelialization: None Tunneling: No Undermining: No Wound Description Classification: Full Thickness Without Exposed Support Structures Wound Margin: Well defined, not attached Exudate Amount: Large Exudate Type: Purulent Exudate Color: yellow, brown, green Foul Odor After Cleansing: No Slough/Fibrino No Wound Bed Granulation Amount: Small (1-33%) Exposed Structure Granulation Quality: Red Fascia Exposed: No Necrotic Amount: None Present (0%) Fat Layer (Subcutaneous Tissue) Exposed: Yes Tendon Exposed: No Muscle Exposed: No Joint Exposed: No Bone Exposed: No Electronic Signature(s) Signed: 10/16/2020 5:00:24 PM By: Baruch Gouty RN, BSN  Signed: 10/19/2020 7:58:59 AM By: Sandre Kitty Entered By: Sandre Kitty on 10/16/2020 16:53:15 -------------------------------------------------------------------------------- Vitals Details Patient Name: Date of Service: [name] [name]. 10/16/2020 7:30 A M Medical Record Number: [id number] Patient Account Number: [id number] Date of Birth/Sex: Treating RN: 1931/01/28 (87 y.o. Female) Baruch Gouty Primary Care Liah Morr: Pricilla Holm Other Clinician: Referring Bexley Laubach: Treating Clifford Benninger/Extender: Charlie Pitter in Treatment: 0 Vital  Signs Time Taken: 07:56 Temperature (F): 97.7 Height (in): 64 Pulse (bpm): 118 Source: Stated Respiratory Rate (breaths/min): 18 Weight (lbs): 98 Blood Pressure (mmHg): 104/74 Source: Stated Reference Range: 80 - 120 mg / dl Body Mass Index (BMI): 16.8 Electronic Signature(s) Signed: 10/16/2020 5:00:24 PM By: Baruch Gouty RN, BSN Entered By: Baruch Gouty on 10/16/2020 07:59:10

## 2020-10-19 NOTE — Progress Notes (Signed)
Virtual Visit via Audio Note  I connected with [name] [name] on 10/19/20 at  4:00 PM EDT by an audio-only enabled telemedicine application and verified that I am speaking with the correct person using two identifiers.  The patient and the provider were at separate locations throughout the entire encounter. Patient location: home, Provider location: work   I discussed the limitations of evaluation and management by telemedicine and the availability of in person appointments. The patient expressed understanding and agreed to proceed. The patient and the provider were the only parties present for the visit unless noted in HPI below.  History of Present Illness: The patient is a 87 y.o. female with visit for ER follow up. Started with worsening abdominal pain and then she went to ER. Has severe abdominal pain now. Had surgery back in February 2022. They did CT abdomen which was not changed and labs. I do notice that white blood count on CBC has increased from 9 to almost 15 in the last 2 weeks. She was seen at wound clinic and wound culture done. I notice that this appears to be growing staph aureus. She does have pcn allergy but is able to tolerate cephalosporins and given this during recent hospital stay without problems. She denies fevers or chills. Having more severe abdominal pain. This is not well managed by the hydrocodone 5/325 given by surgeon. She did ask recently for increase and they did not feel that this was necessary. Denies diarrhea or constipation. Using creon with meals and this is helping well. Overall it is not improving well and she feels she is having set back. She is tired and not moving around as much as she was a week or two ago due to uncontrolled pain. Has tried tylenol during the day and uses hydrocodone in the morning and at night only. Friend and relative also present on phone call with patient to help provide history.   Observations/Objective: A and O times 3  Assessment and  Plan: See problem oriented charting  Follow Up Instructions: increase dose of hydrocodone to 10/325 for better pain control, rx keflex 7 day course for wound culture positive for staph aureus (she has been on this in the hospital   Visit time 25 minutes in non-face to face communication with patient and coordination of care.  I discussed the assessment and treatment plan with the patient. The patient was provided an opportunity to ask questions and all were answered. The patient agreed with the plan and demonstrated an understanding of the instructions.   The patient was advised to call back or seek an in-person evaluation if the symptoms worsen or if the condition fails to improve as anticipated.  Hoyt Koch, MD

## 2020-10-20 ENCOUNTER — Telehealth: Payer: Self-pay

## 2020-10-20 ENCOUNTER — Telehealth: Payer: Self-pay | Admitting: Internal Medicine

## 2020-10-20 DIAGNOSIS — R109 Unspecified abdominal pain: Secondary | ICD-10-CM | POA: Insufficient documentation

## 2020-10-20 MED ORDER — FLUCONAZOLE 150 MG PO TABS: 150.0000 mg | ORAL_TABLET | ORAL | 0 refills | Status: DC

## 2020-10-20 NOTE — Telephone Encounter (Signed)
Attempted to contact patient to schedule a Palliative Care consult appointment. No answer or voicemail. Sent a Mychart message also.

## 2020-10-20 NOTE — Telephone Encounter (Signed)
[name] [name], patients friend called and was wondering if something for a yeast infection could be called in. She said that when the patient takes and antibiotic she usually gets one. It can be sent to [facility], [address]. Please advise

## 2020-10-20 NOTE — Telephone Encounter (Signed)
See below

## 2020-10-20 NOTE — Telephone Encounter (Signed)
Sent in diflucan

## 2020-10-20 NOTE — Assessment & Plan Note (Signed)
Likely coming from wound and sequelae of surgery. She is eating slightly better. Using creon with meals. We did talk about the fact that this was a major surgery and some set backs can be expected and she should still make progress. Given wound culture positive at this time will rx keflex and follow up on culture sensitivities. Will increase strength of hydrocodone to 10/325 to see if we can manage pain better to help overall with recovery. Close follow up 2-3 weeks. Continue with wound care weekly. Take hydrocodone prior to help with pain during wound exploration and debridement.

## 2020-10-21 ENCOUNTER — Ambulatory Visit (INDEPENDENT_AMBULATORY_CARE_PROVIDER_SITE_OTHER): Payer: Medicare Other | Admitting: *Deleted

## 2020-10-21 ENCOUNTER — Telehealth: Payer: Self-pay

## 2020-10-21 ENCOUNTER — Encounter: Payer: Self-pay | Admitting: *Deleted

## 2020-10-21 ENCOUNTER — Telehealth: Payer: Self-pay | Admitting: Internal Medicine

## 2020-10-21 DIAGNOSIS — I5032 Chronic diastolic (congestive) heart failure: Secondary | ICD-10-CM

## 2020-10-21 DIAGNOSIS — I2510 Atherosclerotic heart disease of native coronary artery without angina pectoris: Secondary | ICD-10-CM | POA: Diagnosis not present

## 2020-10-21 DIAGNOSIS — M069 Rheumatoid arthritis, unspecified: Secondary | ICD-10-CM | POA: Diagnosis not present

## 2020-10-21 DIAGNOSIS — J9611 Chronic respiratory failure with hypoxia: Secondary | ICD-10-CM

## 2020-10-21 DIAGNOSIS — T8131XA Disruption of external operation (surgical) wound, not elsewhere classified, initial encounter: Secondary | ICD-10-CM | POA: Diagnosis not present

## 2020-10-21 DIAGNOSIS — E43 Unspecified severe protein-calorie malnutrition: Secondary | ICD-10-CM | POA: Diagnosis not present

## 2020-10-21 DIAGNOSIS — Z7982 Long term (current) use of aspirin: Secondary | ICD-10-CM | POA: Diagnosis not present

## 2020-10-21 NOTE — Chronic Care Management (AMB) (Signed)
Chronic Care Management   CCM RN Visit Note  10/21/2020 Name: [name] [name] MRN: [id number] DOB: 1930-11-24  Subjective: [name] [name] is a 87 y.o. year old female who is a primary care patient of Hoyt Koch, MD. The care management team was consulted for assistance with disease management and care coordination needs.    Engaged with patient by telephone for initial visit in response to provider referral for case management and/or care coordination services.  Patient provides verbal consent for her private duty caregiver "Tammy" to participate in call today, and further provides consent for me to speak with Tammy "at any time in the future."  Consent to Services:  The patient was given information about Chronic Care Management services, agreed to services, and gave verbal consent prior to initiation of services.  Please see initial visit note for detailed documentation.   Patient agreed to services and verbal consent obtained 10/08/20 by scheduling care guide.   Assessment: Review of patient past medical history, allergies, medications, health status, including review of consultants reports, laboratory and other test data, was performed as part of comprehensive evaluation and provision of chronic care management services.   SDOH (Social Determinants of Health) assessments and interventions performed:  SDOH Interventions   Flowsheet Row Most Recent Value  SDOH Interventions   Food Insecurity Interventions Intervention Not Indicated  Financial Strain Interventions Intervention Not Indicated  Transportation Interventions Intervention Not Indicated      CCM Care Plan  Allergies  Allergen Reactions  . Crestor [Rosuvastatin] Other (See Comments)    Muscle pain  . Lactose Intolerance (Gi) Other (See Comments)    Stomach upset  . Penicillins Hives  . Imuran [Azathioprine] Nausea And Vomiting    Outpatient Encounter Medications as of 10/21/2020  Medication Sig Note  .  cephALEXin (KEFLEX) 500 MG capsule Take 1 capsule (500 mg total) by mouth 2 (two) times daily for 7 days.   . famotidine (PEPCID) 20 MG tablet Take 1 tablet (20 mg total) by mouth daily. (Patient taking differently: Take 20 mg by mouth daily as needed for heartburn or indigestion.)   . fluconazole (DIFLUCAN) 150 MG tablet Take 1 tablet (150 mg total) by mouth every 3 (three) days.   . fluticasone (FLONASE) 50 MCG/ACT nasal spray Place 2 sprays into both nostrils daily as needed for allergies or rhinitis.   . furosemide (LASIX) 20 MG tablet Take 1 tablet by mouth daily as needed for swelling. (Patient taking differently: Take 20 mg by mouth daily as needed for fluid or edema.)   . HYDROcodone-acetaminophen (NORCO) 10-325 MG tablet Take 1-2 tablets by mouth 2 (two) times daily as needed for up to 5 days.   . InFLIXimab (REMICADE IV) Inject into the vein as directed. Every 2 months 08/17/2020: Due tomorrow  . lipase/protease/amylase (CREON) 36000 UNITS CPEP capsule Take 1 capsule (36,000 Units total) by mouth 3 (three) times daily before meals.   . mupirocin ointment (BACTROBAN) 2 % Apply 1 application topically 2 (two) times daily.   . mycophenolate (CELLCEPT) 500 MG tablet 2 tabs bid (Patient taking differently: Take 1,000 mg by mouth 2 (two) times daily.)   . Nintedanib (OFEV) 100 MG CAPS Take 1 capsule (100 mg total) by mouth 2 (two) times daily.   . nitroGLYCERIN (NITROSTAT) 0.4 MG SL tablet Place 1 tablet (0.4 mg total) under the tongue every 5 (five) minutes as needed for chest pain.   . nitroGLYCERIN (NITROSTAT) 0.4 MG SL tablet Place 0.4 mg under  the tongue every 5 (five) minutes as needed for chest pain.   . Nutritional Supplements (,FEEDING SUPPLEMENT, PROSOURCE PLUS) liquid Take 30 mLs by mouth 2 (two) times daily between meals.   . potassium chloride (KLOR-CON) 10 MEQ tablet Take 20 Meq twice daily x 2 days and then 20 Meq daily (Patient taking differently: Take 10 mEq by mouth 2 (two) times  daily.)   . predniSONE (DELTASONE) 5 MG tablet TAKE 1 TABLET DAILY WITH BREAKFAST (Patient taking differently: Take 5 mg by mouth daily with breakfast.)    No facility-administered encounter medications on file as of 10/21/2020.    Patient Active Problem List   Diagnosis Date Noted  . Abdominal pain 10/20/2020  . Diarrhea 10/02/2020  . Pressure injury of skin 09/06/2020  . Acute on chronic respiratory failure with hypoxemia ([place]) 08/23/2020  . Protein-calorie malnutrition, severe 08/20/2020  . Fall 01/25/2020  . Other fatigue 01/03/2020  . Left shoulder pain 01/03/2020  . Urinary frequency 01/03/2020  . Acute otalgia, left 09/06/2019  . Unintentional weight loss 07/18/2019  . Personal history of PE (pulmonary embolism) 04/23/2019  . Headache 02/11/2019  . Iron deficiency anemia 12/21/2018  . Cold sore 10/23/2018  . Blood in stool 09/14/2018  . 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 Therapeutic drug monitoring 07/17/2018  . Chronic diastolic CHF (congestive heart failure) ([place]) 12/19/2017  . Rash 08/11/2017  . Chronic respiratory failure with hypoxia ([place]) 03/30/2017  . Angular cheilitis 03/08/2017  . Leg pain 10/25/2016  . Back pain 08/05/2016  . RA (rheumatoid arthritis) ([place]) 03/31/2015  . Allergic rhinitis   . Varicose vein 09/11/2014  . ILD (interstitial lung disease) ([place]) 06/25/2012  . Chest pain 02/27/2012  . CAD (coronary artery disease) 02/27/2012  . Pruritus 08/18/2009  . Postinflammatory pulmonary fibrosis / RA ILD  06/30/2008  . Constipation 01/03/2008  . Dyslipidemia 06/16/2007    Conditions to be addressed/monitored:CHF and surgical abdominal/ sacral wound complications  Care Plan : General Plan of Care (Adult)  Updates made by Knox Royalty, RN since 10/21/2020 12:00 AM    Problem: Quality of Life (General Plan of Care)   Priority: Medium    Long-Range Goal: Quality of Life Maintained   Start Date: 10/21/2020  Expected End Date: 04/22/2021  This Visit's  Progress: On track  Priority: Medium  Note:   Current Barriers:   Ineffective Self Health Maintenance   Unable to independently care for self at home: using private duty caregivers post- hospitalization/ surgery in February 2022  Unable to self administer medications as prescribed- private duty caregivers prepare/ provide medications and meals  Unable to perform ADLs independently- requires assistance from private duty caregivers and family members  Multiple complications with surgical abdominal wound and sacral decubitus ulcer healing Clinical Goal(s):  [name] Collaboration with Hoyt Koch, MD regarding development and update of comprehensive plan of care as evidenced by provider attestation and co-signature . Inter-disciplinary care team collaboration (see longitudinal plan of care) Over the next 6 months, patient will work with care management team to address care coordination and chronic disease management needs related to  . Disease Management . Educational Needs . Care Coordination . Need for palliative care involvement in elderly patient with multiple chronic conditions   Interventions:   Evaluation of current treatment plan related to CHF and complications with wound healing, ADL IADL limitations and complications post- recent hospitalization; self-management and patient's adherence to plan as established by provider.  Collaboration with Hoyt Koch, MD regarding development and  update of comprehensive plan of care as evidenced by provider attestation       and co-signature  Inter-disciplinary care team collaboration (see longitudinal plan of care)  Chart reviewed including relevant office notes, upcoming scheduled appointments, and lab results; initial assessment initiated  Discussed with patient and her caregiver Tammy patient's current clinical condition; confirmed no current clinical concerns, however, patient admits that she has had multiple  complications  Discussed current care at home: home health services remain active; patient continues to have 24-hour private duty caregivers at home- encouraged ongoing active participation/ engagement  Discussed/ evaluated current home interventions for wound care: BID dressing changes to surgical abdominal wound which continues to drain- home health nurses and family members (nieces) currently perform dressing changes; confirmed sacral decubitus ulcer has dressing which is changed 2-3 times weekly and as indicated: confirmed patient is "hopeful" that wounds are "slowly healing"  Confirmed patient has started receiving treatment at Wound clinic- reviewed upcoming appointments and encouraged her ongoing participation in treatment  [facility] referral placed by PCP 10/19/20: explained role of palliative care team, difference between palliative care and hospice care- encouraged patient and caregiver to contact [facility] team promptly once our call is completed- patient verbalizes agreement  Discussed plans with patient for ongoing care management follow up and provided patient with direct contact information for care management team Self Care Activities:  . Patient verbalizes understanding of plan to continue working with home health team, private duty caregivers, and to contact [facility] team to set up initial appointment . Attends all scheduled provider appointments . Calls pharmacy for medication refills . Calls provider office for new concerns or questions Patient Goals: [name] Great job having established in-home help from your private duty caregivers- please call and engage with the [facility] team, which Dr. Sharlet Salina requested on 10/19/20- [facility] team: [phone] . Always discuss my treatment options with the doctor or nurse . Make shared treatment decisions with doctor  Follow Up Plan:  . Telephone  follow up appointment with care management team member scheduled for: Wednesday October 28, 2020 at 9:00 am . The patient has been provided with contact information for the care management team and has been advised to call with any health related questions or concerns   Care Plan : Heart Failure (Adult)  Updates made by Knox Royalty, RN since 10/21/2020 12:00 AM    Problem: Symptom Exacerbation (Heart Failure)     Long-Range Goal: Symptom Exacerbation Prevented or Minimized   Start Date: 10/21/2020  Expected End Date: 04/22/2021  This Visit's Progress: On track  Priority: Medium  Note:   Current Barriers:  [name] Knowledge deficit related to basic heart failure pathophysiology and self care management . Unable to independently complete ADL/ iADL's- has 24-hour private duty care assistance at home . Elderly patient with multiple chronic conditions, including slow healing surgical abdominal wound and sacral decubitus ulcer requiring frequent dressing changes . 10/21/20: Does not currently monitor/ record daily weights at home Case Manager Clinical Goal(s):  Over the next 6 months, patient will: . weigh self 3 times per week and record weight on paper for review with care providers . verbalize understanding of Heart Failure Action Plan and when to call doctor: weight gain > 3 lbs overnight/ 5 lbs one week; new or worsening shortness of breath; new or worsening shortness of breath, feeling of chest tightness or heaviness; new or worsening swelling in lower extremities or abdomen .  notify MD of 3 lb weight gain over night or 5 lb in a week Interventions:  . Collaboration with Hoyt Koch, MD regarding development and update of comprehensive plan of care as evidenced by provider attestation and co-signature . Inter-disciplinary care team collaboration (see longitudinal plan of care) . Chart reviewed including relevant office notes, upcoming scheduled appointments, and lab results . Discussed  current  clinical condition with patient and confirmed no current clinical or medication concerns; confirmed patient continues to manage medications at home with caregiver assistance and reports adherence to medication regimen; confirms that she has obtained recently prescribed medications and is taking as prescribed . Reviewed recent and upcoming provider appointments with patient and confirmed that patient has plans to attend all as scheduled . Basic overview and discussion of pathophysiology of Heart Failure reviewed- explained to patient and caregiver that weight gain is an early indicator of fluid retention  . Provided verbal education and importance/ rationale for daily weight monitoring/ recording at home; confirmed patient is not consistently monitoring/ recording daily weights at home: reinforced importance/ rationale for doing so and encouraged patient to begin increasing weight monitoring/recording to 3 times per week at the same time each day upon waking . Reviewed recent weights at home with patient: patient reports general weight ranges at home at "95 lbs;" reports weighs self "about once a week" . Provided general weight guide guidelines with corresponding action plan: contact doctor for weight gain > 3 lbs overnight/ 5 lbs in one week; discussed additional signs/ symptoms fluid retention and need to obtain guidance around medication/ diuretic management to prevent fluid retention/ dehydration . Reviewed role of diuretics in prevention of fluid overload and management of heart failure . Confirmed patient endorses following low salt, heart healthy diet: this was encouraged and positive reinforcement provided . Confirmed patient continues to use home O2 "all the time" at 2-3 L/min . Encouraged ongoing participation with home health PT and discussed benefit of activity as tolerated in setting of CHF Self-Care Activities: . Patient verbalizes understanding of plan to . Attends all scheduled  provider appointments . Calls pharmacy for medication refills . Calls provider office for new concerns or questions Patient Goals: . Develop a rescue plan and follow rescue plan if symptoms flare-up . Begin weighing yourself at home 3 times per week- weigh first thing when rise in the morning after using bathroom; please write down your weights at home so you can easily keep track of any weight gain . Weight gain greater than 2-3 pounds is an early indicator of fluid build-up: if you gain 2-3 pounds overnight, please contact your doctor for advice on what you should do . Know when to call the doctor: weight gain more than 3 pounds overnight or 5 pounds in one week, new or worsening shortness of breath or a feeling of chest tightness; new swelling in your ankles or your abdomen . Track symptoms and what helps feel better or worse  Follow Up Plan:  . Telephone follow up appointment with care management team member scheduled for: October 28, 2020 at 9:00 am . The patient has been provided with contact information for the care management team and has been advised to call with any health related questions or concerns.        Plan:  Telephone follow up appointment with care management team member scheduled for: October 28, 2020 at 9:00 am  The patient has been provided with contact information for the care management team and has been  advised to call with any health related questions or concerns.   [name], RN, BSN, [facility] RN Care Coordination- [place] [phone]: direct office [phone]: mobile

## 2020-10-21 NOTE — Telephone Encounter (Signed)
Spoke with patient's caregiver Tammy and scheduled an in-person Palliative Consult for 11/10/20 @ 12:30PM  COVID screening was negative. No pets in home. Patient lives with caregiver Tammy.   Consent obtained; updated Outlook/Netsmart/Team List and Epic.  Family is aware they may be receiving a call from NP the day before or day of to confirm appointment.

## 2020-10-21 NOTE — Patient Instructions (Signed)
Visit Information   [name] [name], it was nice talking with you today.   Please read over the attached information, and start now to begin weighing yourself at home at least 3 times each week.  Please write your weights at home down so we can review these with our future phone calls    I look forward to talking to you again for an update on October 28, 2020 at 9:00 am- please be listening out for my call that day.  I will call as close to 9:00 am/ pm as possible; I look forward to hearing about your progress.   Please don't hesitate to contact me if I can be of assistance to you before our next scheduled appointment.   [name] Rack, RN, BSN, [facility] RN Care Coordination- [place] [phone]: direct office [phone]: mobile     PATIENT GOALS:  Goals Addressed            This Visit's Progress   . COMPLETED: Exercise 3x per week (30 min per time)       Increase physical activity.     . [place] My Quality of Life   On track    Timeframe:  Long-Range Goal Priority:  Medium Start Date:         10/21/20                    Expected End Date:    04/22/21                   Follow Up Date 10/28/20   . Great job having established in-home help from your private duty caregivers- please call and engage with the [facility] team, which Dr. Sharlet Salina requested on 10/19/20- [place] team: [phone] . Always discuss my treatment options with the doctor or nurse . Make shared treatment decisions with doctor    Why is this important?    Having a long-term illness can be scary.   It can also be stressful for you and your caregiver.   These steps may help.        . Track and Manage Symptoms-Heart Failure   On track    Timeframe:  Long-Range Goal Priority:  Medium Start Date:      10/21/20                       Expected End Date:   04/22/21                    Follow Up Date 10/28/20   . Develop a rescue plan and  follow rescue plan if symptoms flare-up . Begin weighing yourself at home 3 times per week- weigh first thing when rise in the morning after using bathroom; please write down your weights at home so you can easily keep track of any weight gain . Weight gain greater than 2-3 pounds is an early indicator of fluid build-up: if you gain 2-3 pounds overnight, please contact your doctor for advice on what you should do . Know when to call the doctor: weight gain more than 3 pounds overnight or 5 pounds in one week, new or worsening shortness of breath or a feeling of chest tightness; new swelling in your ankles or your abdomen . Track symptoms and what helps feel better or worse    Why is this important?    You will be able to handle  your symptoms better if you keep track of them.   Making some simple changes to your lifestyle will help.   Eating healthy is one thing you can do to take good care of yourself.           Heart Failure Action Plan A heart failure action plan helps you understand what to do when you have symptoms of heart failure. Your action plan is a color-coded plan that lists the symptoms to watch for and indicates what actions to take.  If you have symptoms in the red zone, you need medical care right away.  If you have symptoms in the yellow zone, you are having problems.  If you have symptoms in the green zone, you are doing well. Follow the plan that was created by you and your health care provider. Review your plan each time you visit your health care provider. Red zone These signs and symptoms mean you should get medical help right away:  You have trouble breathing when resting.  You have a dry cough that is getting worse.  You have swelling or pain in your legs or abdomen that is getting worse.  You suddenly gain more than 2-3 lb (0.9-1.4 kg) in 24 hours, or more than 5 lb (2.3 kg) in a week. This amount may be more or less depending on your condition.  You  have trouble staying awake or you feel confused.  You have chest pain.  You do not have an appetite.  You pass out.  You have worsening sadness or depression. If you have any of these symptoms, call your local emergency services (911 in the [place] right away. Do not drive yourself to the hospital.   Yellow zone These signs and symptoms mean your condition may be getting worse and you should make some changes:  You have trouble breathing when you are active, or you need to sleep with your head raised on extra pillows to help you breathe.  You have swelling in your legs or abdomen.  You gain 2-3 lb (0.9-1.4 kg) in 24 hours, or 5 lb (2.3 kg) in a week. This amount may be more or less depending on your condition.  You get tired easily.  You have trouble sleeping.  You have a dry cough. If you have any of these symptoms:  Contact your health care provider within the next day.  Your health care provider may adjust your medicines.   Green zone These signs mean you are doing well and can continue what you are doing:  You do not have shortness of breath.  You have very little swelling or no new swelling.  Your weight is stable (no gain or loss).  You have a normal activity level.  You do not have chest pain or any other new symptoms.   Follow these instructions at home:  Take over-the-counter and prescription medicines only as told by your health care provider.  Weigh yourself daily. Your target weight is __________ lb (__________ kg). ? Call your health care provider if you gain more than __________ lb (__________ kg) in 24 hours, or more than __________ lb (__________ kg) in a week. ? Health care provider name: _____________________________________________________ ? Health care provider phone number: _____________________________________________________  Eat a heart-healthy diet. Work with a diet and nutrition specialist (dietitian) to create an eating plan that is best for  you.  Keep all follow-up visits. This is important. Where to find more information  American Heart Association: www.heart.org Summary  A heart failure action plan helps you understand what to do when you have symptoms of heart failure.  Follow the action plan that was created by you and your health care provider.  Get help right away if you have any symptoms in the red zone. This information is not intended to replace advice given to you by your health care provider. Make sure you discuss any questions you have with your health care provider. Document Revised: 02/10/2020 Document Reviewed: 02/10/2020 Elsevier Patient Education  2021 [place].   Consent to CCM Services: [name] [name] was given information about Chronic Care Management services today including:  1. CCM service includes personalized support from designated clinical staff supervised by her physician, including individualized plan of care and coordination with other care providers 2. 24/7 contact phone numbers for assistance for urgent and routine care needs. 3. Service will only be billed when office clinical staff spend 20 minutes or more in a month to coordinate care. 4. Only one practitioner may furnish and bill the service in a calendar month. 5. The patient may stop CCM services at any time (effective at the end of the month) by phone call to the office staff. 6. The patient will be responsible for cost sharing (co-pay) of up to 20% of the service fee (after annual deductible is met).  Patient agreed to services and verbal consent obtained.    Patient verbalizes understanding of instructions provided today and agrees to view in [place].   Telephone follow up appointment with care management team member scheduled for: October 28, 2020 at 9:00 am  The patient has been provided with contact information for the care management team and has been advised to call with any health related questions or concerns.   CLINICAL  CARE PLAN: Patient Care Plan: General Plan of Care (Adult)    Problem Identified: Quality of Life (General Plan of Care)   Priority: Medium    Long-Range Goal: Quality of Life Maintained   Start Date: 10/21/2020  Expected End Date: 04/22/2021  This Visit's Progress: On track  Priority: Medium  Note:   Current Barriers:   Ineffective Self Health Maintenance   Unable to independently care for self at home: using private duty caregivers post- hospitalization/ surgery in February 2022  Unable to self administer medications as prescribed- private duty caregivers prepare/ provide medications and meals  Unable to perform ADLs independently- requires assistance from private duty caregivers and family members  Multiple complications with surgical abdominal wound and sacral decubitus ulcer healing Clinical Goal(s):  [name] Collaboration with Hoyt Koch, MD regarding development and update of comprehensive plan of care as evidenced by provider attestation and co-signature . Inter-disciplinary care team collaboration (see longitudinal plan of care) Over the next 6 months, patient will work with care management team to address care coordination and chronic disease management needs related to  . Disease Management . Educational Needs . Care Coordination . Need for palliative care involvement in elderly patient with multiple chronic conditions   Interventions:   Evaluation of current treatment plan related to CHF and complications with wound healing, ADL IADL limitations and complications post- recent hospitalization; self-management and patient's adherence to plan as established by provider.  Collaboration with Hoyt Koch, MD regarding development and update of comprehensive plan of care as evidenced by provider attestation       and co-signature  Inter-disciplinary care team collaboration (see longitudinal plan of care)  Chart reviewed including relevant office notes,  upcoming scheduled appointments,  and lab results; initial assessment initiated  Discussed with patient and her caregiver Tammy patient's current clinical condition; confirmed no current clinical concerns, however, patient admits that she has had multiple complications  Discussed current care at home: home health services remain active; patient continues to have 24-hour private duty caregivers at home- encouraged ongoing active participation/ engagement  Discussed/ evaluated current home interventions for wound care: BID dressing changes to surgical abdominal wound which continues to drain- home health nurses and family members (nieces) currently perform dressing changes; confirmed sacral decubitus ulcer has dressing which is changed 2-3 times weekly and as indicated: confirmed patient is "hopeful" that wounds are "slowly healing"  Confirmed patient has started receiving treatment at [facility]- reviewed upcoming appointments and encouraged her ongoing participation in treatment  [facility] referral placed by PCP 10/19/20: explained role of palliative care team, difference between palliative care and hospice care- encouraged patient and caregiver to contact [facility] promptly once our call is completed- patient verbalizes agreement  Discussed plans with patient for ongoing care management follow up and provided patient with direct contact information for care management team Self Care Activities:  . Patient verbalizes understanding of plan to continue working with home health team, private duty caregivers, and to contact [facility] team to set up initial appointment . Attends all scheduled provider appointments . Calls pharmacy for medication refills . Calls provider office for new concerns or questions Patient Goals: [name] Great job having established in-home help from your private duty caregivers- please call and engage with the [facility] team, which Dr. Sharlet Salina requested on 10/19/20- [place] team: [phone] . Always discuss my treatment options with the doctor or nurse . Make shared treatment decisions with doctor  Follow Up Plan:  . Telephone follow up appointment with care management team member scheduled for: Wednesday October 28, 2020 at 9:00 am . The patient has been provided with contact information for the care management team and has been advised to call with any health related questions or concerns   Patient Care Plan: Heart Failure (Adult)    Problem Identified: Symptom Exacerbation (Heart Failure)     Long-Range Goal: Symptom Exacerbation Prevented or Minimized   Start Date: 10/21/2020  Expected End Date: 04/22/2021  This Visit's Progress: On track  Priority: Medium  Note:   Current Barriers:  [name] Knowledge deficit related to basic heart failure pathophysiology and self care management . Unable to independently complete ADL/ iADL's- has 24-hour private duty care assistance at home . Elderly patient with multiple chronic conditions, including slow healing surgical abdominal wound and sacral decubitus ulcer requiring frequent dressing changes . 10/21/20: Does not currently monitor/ record daily weights at home Case Manager Clinical Goal(s):  Over the next 6 months, patient will: . weigh self 3 times per week and record weight on paper for review with care providers . verbalize understanding of Heart Failure Action Plan and when to call doctor: weight gain > 3 lbs overnight/ 5 lbs one week; new or worsening shortness of breath; new or worsening shortness of breath, feeling of chest tightness or heaviness; new or worsening swelling in lower extremities or abdomen . notify MD of 3 lb weight gain over night or 5 lb in a week Interventions:  . Collaboration with Hoyt Koch, MD regarding development and update of comprehensive plan of care as evidenced by provider attestation  and co-signature . Inter-disciplinary care team collaboration (see longitudinal plan of care) .  Chart reviewed including relevant office notes, upcoming scheduled appointments, and lab results . Discussed current  clinical condition with patient and confirmed no current clinical or medication concerns; confirmed patient continues to manage medications at home with caregiver assistance and reports adherence to medication regimen; confirms that she has obtained recently prescribed medications and is taking as prescribed . Reviewed recent and upcoming provider appointments with patient and confirmed that patient has plans to attend all as scheduled . Basic overview and discussion of pathophysiology of Heart Failure reviewed- explained to patient and caregiver that weight gain is an early indicator of fluid retention  . Provided verbal education and importance/ rationale for daily weight monitoring/ recording at home; confirmed patient is not consistently monitoring/ recording daily weights at home: reinforced importance/ rationale for doing so and encouraged patient to begin increasing weight monitoring/recording to 3 times per week at the same time each day upon waking . Reviewed recent weights at home with patient: patient reports general weight ranges at home at "95 lbs;" reports weighs self "about once a week" . Provided general weight guide guidelines with corresponding action plan: contact doctor for weight gain > 3 lbs overnight/ 5 lbs in one week; discussed additional signs/ symptoms fluid retention and need to obtain guidance around medication/ diuretic management to prevent fluid retention/ dehydration . Reviewed role of diuretics in prevention of fluid overload and management of heart failure . Confirmed patient endorses following low salt, heart healthy diet: this was encouraged and positive reinforcement provided . Confirmed patient continues to use home O2 "all the time" at 2-3  L/min . Encouraged ongoing participation with home health PT and discussed benefit of activity as tolerated in setting of CHF Self-Care Activities: . Patient verbalizes understanding of plan to . Attends all scheduled provider appointments . Calls pharmacy for medication refills . Calls provider office for new concerns or questions Patient Goals: . Develop a rescue plan and follow rescue plan if symptoms flare-up . Begin weighing yourself at home 3 times per week- weigh first thing when rise in the morning after using bathroom; please write down your weights at home so you can easily keep track of any weight gain . Weight gain greater than 2-3 pounds is an early indicator of fluid build-up: if you gain 2-3 pounds overnight, please contact your doctor for advice on what you should do . Know when to call the doctor: weight gain more than 3 pounds overnight or 5 pounds in one week, new or worsening shortness of breath or a feeling of chest tightness; new swelling in your ankles or your abdomen . Track symptoms and what helps feel better or worse  Follow Up Plan:  . Telephone follow up appointment with care management team member scheduled for: October 28, 2020 at 9:00 am . The patient has been provided with contact information for the care management team and has been advised to call with any health related questions or concerns.       [name] Rack, RN, BSN, [facility] RN Care Coordination- [place] [phone]: direct office [phone]: mobile

## 2020-10-21 NOTE — Telephone Encounter (Signed)
Liji w/ [facility] called and is requesting verbals to hold PT services. She said that patient is in extreme pain and would like to extend for 2 more weeks.   Okay to LVM: [phone]

## 2020-10-21 NOTE — Telephone Encounter (Signed)
See below

## 2020-10-22 ENCOUNTER — Telehealth: Payer: Self-pay | Admitting: Internal Medicine

## 2020-10-22 LAB — AEROBIC/ANAEROBIC CULTURE W GRAM STAIN (SURGICAL/DEEP WOUND)

## 2020-10-22 NOTE — Telephone Encounter (Signed)
Spoke with Liji to give verbal orders. No other questions or concerns at this time.

## 2020-10-22 NOTE — Telephone Encounter (Signed)
See below

## 2020-10-22 NOTE — Telephone Encounter (Signed)
Fine

## 2020-10-22 NOTE — Telephone Encounter (Signed)
Anissa from [facility] calling, states the patients gums are sore and irritated to the point the patient cant put her teeth in and she also looks like she has some thrush so she is wondering if there was anything we could send in for her.

## 2020-10-23 ENCOUNTER — Emergency Department (HOSPITAL_COMMUNITY): Payer: Medicare Other

## 2020-10-23 ENCOUNTER — Encounter (HOSPITAL_BASED_OUTPATIENT_CLINIC_OR_DEPARTMENT_OTHER): Payer: Medicare Other | Admitting: Internal Medicine

## 2020-10-23 ENCOUNTER — Inpatient Hospital Stay (HOSPITAL_COMMUNITY)
Admission: EM | Admit: 2020-10-23 | Discharge: 2020-11-04 | DRG: 862 | Disposition: A | Discharge status: Rehabilitation Facility | Payer: Medicare Other | Attending: Student | Admitting: Student

## 2020-10-23 ENCOUNTER — Encounter (HOSPITAL_COMMUNITY): Payer: Self-pay | Admitting: Family Medicine

## 2020-10-23 DIAGNOSIS — Z8249 Family history of ischemic heart disease and other diseases of the circulatory system: Secondary | ICD-10-CM

## 2020-10-23 DIAGNOSIS — R220 Localized swelling, mass and lump, head: Secondary | ICD-10-CM | POA: Diagnosis not present

## 2020-10-23 DIAGNOSIS — M21371 Foot drop, right foot: Secondary | ICD-10-CM | POA: Diagnosis present

## 2020-10-23 DIAGNOSIS — K1230 Oral mucositis (ulcerative), unspecified: Secondary | ICD-10-CM | POA: Diagnosis present

## 2020-10-23 DIAGNOSIS — E785 Hyperlipidemia, unspecified: Secondary | ICD-10-CM | POA: Diagnosis present

## 2020-10-23 DIAGNOSIS — I5032 Chronic diastolic (congestive) heart failure: Secondary | ICD-10-CM

## 2020-10-23 DIAGNOSIS — Z86718 Personal history of other venous thrombosis and embolism: Secondary | ICD-10-CM

## 2020-10-23 DIAGNOSIS — R519 Headache, unspecified: Secondary | ICD-10-CM | POA: Diagnosis not present

## 2020-10-23 DIAGNOSIS — Z515 Encounter for palliative care: Secondary | ICD-10-CM | POA: Diagnosis not present

## 2020-10-23 DIAGNOSIS — R6520 Severe sepsis without septic shock: Secondary | ICD-10-CM | POA: Diagnosis present

## 2020-10-23 DIAGNOSIS — R109 Unspecified abdominal pain: Secondary | ICD-10-CM | POA: Diagnosis not present

## 2020-10-23 DIAGNOSIS — A419 Sepsis, unspecified organism: Secondary | ICD-10-CM

## 2020-10-23 DIAGNOSIS — Z96653 Presence of artificial knee joint, bilateral: Secondary | ICD-10-CM | POA: Diagnosis present

## 2020-10-23 DIAGNOSIS — Z7952 Long term (current) use of systemic steroids: Secondary | ICD-10-CM | POA: Diagnosis not present

## 2020-10-23 DIAGNOSIS — T8141XA Infection following a procedure, superficial incisional surgical site, initial encounter: Principal | ICD-10-CM | POA: Diagnosis present

## 2020-10-23 DIAGNOSIS — Z90710 Acquired absence of both cervix and uterus: Secondary | ICD-10-CM

## 2020-10-23 DIAGNOSIS — J323 Chronic sphenoidal sinusitis: Secondary | ICD-10-CM | POA: Diagnosis present

## 2020-10-23 DIAGNOSIS — R1310 Dysphagia, unspecified: Secondary | ICD-10-CM

## 2020-10-23 DIAGNOSIS — Z888 Allergy status to other drugs, medicaments and biological substances status: Secondary | ICD-10-CM

## 2020-10-23 DIAGNOSIS — J9611 Chronic respiratory failure with hypoxia: Secondary | ICD-10-CM | POA: Diagnosis present

## 2020-10-23 DIAGNOSIS — Y838 Other surgical procedures as the cause of abnormal reaction of the patient, or of later complication, without mention of misadventure at the time of the procedure: Secondary | ICD-10-CM | POA: Diagnosis present

## 2020-10-23 DIAGNOSIS — R634 Abnormal weight loss: Secondary | ICD-10-CM | POA: Diagnosis present

## 2020-10-23 DIAGNOSIS — E870 Hyperosmolality and hypernatremia: Secondary | ICD-10-CM | POA: Diagnosis present

## 2020-10-23 DIAGNOSIS — J8410 Pulmonary fibrosis, unspecified: Secondary | ICD-10-CM | POA: Diagnosis not present

## 2020-10-23 DIAGNOSIS — K9423 Gastrostomy malfunction: Secondary | ICD-10-CM | POA: Diagnosis present

## 2020-10-23 DIAGNOSIS — L03311 Cellulitis of abdominal wall: Secondary | ICD-10-CM | POA: Diagnosis present

## 2020-10-23 DIAGNOSIS — J449 Chronic obstructive pulmonary disease, unspecified: Secondary | ICD-10-CM | POA: Diagnosis present

## 2020-10-23 DIAGNOSIS — B49 Unspecified mycosis: Secondary | ICD-10-CM | POA: Diagnosis not present

## 2020-10-23 DIAGNOSIS — Z20822 Contact with and (suspected) exposure to covid-19: Secondary | ICD-10-CM | POA: Diagnosis present

## 2020-10-23 DIAGNOSIS — L02211 Cutaneous abscess of abdominal wall: Secondary | ICD-10-CM | POA: Diagnosis present

## 2020-10-23 DIAGNOSIS — D638 Anemia in other chronic diseases classified elsewhere: Secondary | ICD-10-CM | POA: Diagnosis present

## 2020-10-23 DIAGNOSIS — E43 Unspecified severe protein-calorie malnutrition: Secondary | ICD-10-CM | POA: Diagnosis present

## 2020-10-23 DIAGNOSIS — R5383 Other fatigue: Secondary | ICD-10-CM | POA: Diagnosis not present

## 2020-10-23 DIAGNOSIS — N179 Acute kidney failure, unspecified: Secondary | ICD-10-CM | POA: Diagnosis present

## 2020-10-23 DIAGNOSIS — B370 Candidal stomatitis: Secondary | ICD-10-CM | POA: Diagnosis present

## 2020-10-23 DIAGNOSIS — Z79899 Other long term (current) drug therapy: Secondary | ICD-10-CM

## 2020-10-23 DIAGNOSIS — Z86711 Personal history of pulmonary embolism: Secondary | ICD-10-CM | POA: Diagnosis present

## 2020-10-23 DIAGNOSIS — G8929 Other chronic pain: Secondary | ICD-10-CM | POA: Diagnosis present

## 2020-10-23 DIAGNOSIS — T8131XA Disruption of external operation (surgical) wound, not elsewhere classified, initial encounter: Secondary | ICD-10-CM | POA: Diagnosis not present

## 2020-10-23 DIAGNOSIS — E739 Lactose intolerance, unspecified: Secondary | ICD-10-CM | POA: Diagnosis present

## 2020-10-23 DIAGNOSIS — Z931 Gastrostomy status: Secondary | ICD-10-CM | POA: Diagnosis not present

## 2020-10-23 DIAGNOSIS — Z820 Family history of epilepsy and other diseases of the nervous system: Secondary | ICD-10-CM | POA: Diagnosis not present

## 2020-10-23 DIAGNOSIS — K828 Other specified diseases of gallbladder: Secondary | ICD-10-CM | POA: Diagnosis not present

## 2020-10-23 DIAGNOSIS — J84112 Idiopathic pulmonary fibrosis: Secondary | ICD-10-CM | POA: Diagnosis present

## 2020-10-23 DIAGNOSIS — R627 Adult failure to thrive: Secondary | ICD-10-CM | POA: Diagnosis not present

## 2020-10-23 DIAGNOSIS — M810 Age-related osteoporosis without current pathological fracture: Secondary | ICD-10-CM | POA: Diagnosis present

## 2020-10-23 DIAGNOSIS — T8130XD Disruption of wound, unspecified, subsequent encounter: Secondary | ICD-10-CM

## 2020-10-23 DIAGNOSIS — R4182 Altered mental status, unspecified: Secondary | ICD-10-CM | POA: Diagnosis not present

## 2020-10-23 DIAGNOSIS — D500 Iron deficiency anemia secondary to blood loss (chronic): Secondary | ICD-10-CM

## 2020-10-23 DIAGNOSIS — E871 Hypo-osmolality and hyponatremia: Secondary | ICD-10-CM | POA: Diagnosis not present

## 2020-10-23 DIAGNOSIS — K0820 Unspecified atrophy of edentulous alveolar ridge: Secondary | ICD-10-CM

## 2020-10-23 DIAGNOSIS — Z7982 Long term (current) use of aspirin: Secondary | ICD-10-CM | POA: Diagnosis not present

## 2020-10-23 DIAGNOSIS — R1031 Right lower quadrant pain: Secondary | ICD-10-CM | POA: Diagnosis not present

## 2020-10-23 DIAGNOSIS — E860 Dehydration: Secondary | ICD-10-CM | POA: Diagnosis present

## 2020-10-23 DIAGNOSIS — L89153 Pressure ulcer of sacral region, stage 3: Secondary | ICD-10-CM | POA: Diagnosis present

## 2020-10-23 DIAGNOSIS — I2510 Atherosclerotic heart disease of native coronary artery without angina pectoris: Secondary | ICD-10-CM | POA: Diagnosis not present

## 2020-10-23 DIAGNOSIS — T8130XA Disruption of wound, unspecified, initial encounter: Secondary | ICD-10-CM | POA: Diagnosis not present

## 2020-10-23 DIAGNOSIS — Z7189 Other specified counseling: Secondary | ICD-10-CM | POA: Diagnosis not present

## 2020-10-23 DIAGNOSIS — E46 Unspecified protein-calorie malnutrition: Secondary | ICD-10-CM | POA: Diagnosis not present

## 2020-10-23 DIAGNOSIS — J849 Interstitial pulmonary disease, unspecified: Secondary | ICD-10-CM | POA: Diagnosis not present

## 2020-10-23 DIAGNOSIS — R630 Anorexia: Secondary | ICD-10-CM | POA: Diagnosis not present

## 2020-10-23 DIAGNOSIS — D696 Thrombocytopenia, unspecified: Secondary | ICD-10-CM | POA: Diagnosis present

## 2020-10-23 DIAGNOSIS — D849 Immunodeficiency, unspecified: Secondary | ICD-10-CM | POA: Diagnosis present

## 2020-10-23 DIAGNOSIS — R64 Cachexia: Secondary | ICD-10-CM | POA: Diagnosis present

## 2020-10-23 DIAGNOSIS — Z0120 Encounter for dental examination and cleaning without abnormal findings: Secondary | ICD-10-CM

## 2020-10-23 DIAGNOSIS — M6284 Sarcopenia: Secondary | ICD-10-CM | POA: Diagnosis present

## 2020-10-23 DIAGNOSIS — R531 Weakness: Secondary | ICD-10-CM | POA: Diagnosis not present

## 2020-10-23 DIAGNOSIS — M069 Rheumatoid arthritis, unspecified: Secondary | ICD-10-CM | POA: Diagnosis present

## 2020-10-23 DIAGNOSIS — Z9981 Dependence on supplemental oxygen: Secondary | ICD-10-CM | POA: Diagnosis not present

## 2020-10-23 DIAGNOSIS — E876 Hypokalemia: Secondary | ICD-10-CM | POA: Diagnosis not present

## 2020-10-23 DIAGNOSIS — Z431 Encounter for attention to gastrostomy: Secondary | ICD-10-CM | POA: Diagnosis not present

## 2020-10-23 DIAGNOSIS — M21611 Bunion of right foot: Secondary | ICD-10-CM | POA: Diagnosis present

## 2020-10-23 DIAGNOSIS — Z880 Allergy status to penicillin: Secondary | ICD-10-CM

## 2020-10-23 DIAGNOSIS — J309 Allergic rhinitis, unspecified: Secondary | ICD-10-CM | POA: Diagnosis present

## 2020-10-23 DIAGNOSIS — Z681 Body mass index (BMI) 19 or less, adult: Secondary | ICD-10-CM | POA: Diagnosis not present

## 2020-10-23 DIAGNOSIS — Z87891 Personal history of nicotine dependence: Secondary | ICD-10-CM

## 2020-10-23 DIAGNOSIS — M21372 Foot drop, left foot: Secondary | ICD-10-CM | POA: Diagnosis not present

## 2020-10-23 DIAGNOSIS — Z9049 Acquired absence of other specified parts of digestive tract: Secondary | ICD-10-CM

## 2020-10-23 DIAGNOSIS — E875 Hyperkalemia: Secondary | ICD-10-CM | POA: Diagnosis present

## 2020-10-23 DIAGNOSIS — R5381 Other malaise: Secondary | ICD-10-CM | POA: Diagnosis not present

## 2020-10-23 DIAGNOSIS — J439 Emphysema, unspecified: Secondary | ICD-10-CM | POA: Diagnosis not present

## 2020-10-23 DIAGNOSIS — K1379 Other lesions of oral mucosa: Secondary | ICD-10-CM | POA: Diagnosis not present

## 2020-10-23 DIAGNOSIS — D509 Iron deficiency anemia, unspecified: Secondary | ICD-10-CM | POA: Diagnosis present

## 2020-10-23 LAB — VITAMIN B12: Vitamin B-12: 1231 pg/mL — ABNORMAL HIGH (ref 180–914)

## 2020-10-23 LAB — CBC WITH DIFFERENTIAL/PLATELET
Abs Immature Granulocytes: 0.07 10*3/uL (ref 0.00–0.07)
Basophils Absolute: 0.1 10*3/uL (ref 0.0–0.1)
Basophils Relative: 1 %
Eosinophils Absolute: 0 10*3/uL (ref 0.0–0.5)
Eosinophils Relative: 0 %
HCT: 38.7 % (ref 36.0–46.0)
Hemoglobin: 11.9 g/dL — ABNORMAL LOW (ref 12.0–15.0)
Immature Granulocytes: 1 %
Lymphocytes Relative: 44 %
Lymphs Abs: 5 10*3/uL — ABNORMAL HIGH (ref 0.7–4.0)
MCH: 31.1 pg (ref 26.0–34.0)
MCHC: 30.7 g/dL (ref 30.0–36.0)
MCV: 101 fL — ABNORMAL HIGH (ref 80.0–100.0)
Monocytes Absolute: 1.2 10*3/uL — ABNORMAL HIGH (ref 0.1–1.0)
Monocytes Relative: 10 %
Neutro Abs: 5.1 10*3/uL (ref 1.7–7.7)
Neutrophils Relative %: 44 %
Platelets: 326 10*3/uL (ref 150–400)
RBC: 3.83 MIL/uL — ABNORMAL LOW (ref 3.87–5.11)
RDW: 20.9 % — ABNORMAL HIGH (ref 11.5–15.5)
WBC: 11.4 10*3/uL — ABNORMAL HIGH (ref 4.0–10.5)
nRBC: 0 % (ref 0.0–0.2)

## 2020-10-23 LAB — COMPREHENSIVE METABOLIC PANEL
ALT: 15 U/L (ref 0–44)
AST: 32 U/L (ref 15–41)
Albumin: 2.7 g/dL — ABNORMAL LOW (ref 3.5–5.0)
Alkaline Phosphatase: 121 U/L (ref 38–126)
Anion gap: 10 (ref 5–15)
BUN: 13 mg/dL (ref 8–23)
CO2: 26 mmol/L (ref 22–32)
Calcium: 9.1 mg/dL (ref 8.9–10.3)
Chloride: 110 mmol/L (ref 98–111)
Creatinine, Ser: 0.83 mg/dL (ref 0.44–1.00)
GFR, Estimated: >60 mL/min (ref >60)
Glucose, Bld: 92 mg/dL (ref 70–99)
Potassium: 5.3 mmol/L — ABNORMAL HIGH (ref 3.5–5.1)
Sodium: 146 mmol/L — ABNORMAL HIGH (ref 135–145)
Total Bilirubin: 0.9 mg/dL (ref 0.3–1.2)
Total Protein: 7 g/dL (ref 6.5–8.1)

## 2020-10-23 LAB — PREALBUMIN: Prealbumin: 8 mg/dL — ABNORMAL LOW (ref 18–38)

## 2020-10-23 LAB — APTT: aPTT: 35 seconds (ref 24–36)

## 2020-10-23 LAB — RESP PANEL BY RT-PCR (FLU A&B, COVID) ARPGX2
Influenza A by PCR: NEGATIVE
Influenza B by PCR: NEGATIVE
SARS Coronavirus 2 by RT PCR: NEGATIVE

## 2020-10-23 LAB — LACTIC ACID, PLASMA
Lactic Acid, Venous: 2.7 mmol/L (ref 0.5–1.9)
Lactic Acid, Venous: 3.4 mmol/L (ref 0.5–1.9)

## 2020-10-23 LAB — PROTIME-INR
INR: 1 (ref 0.8–1.2)
Prothrombin Time: 12.6 seconds (ref 11.4–15.2)

## 2020-10-23 MED ORDER — ONDANSETRON HCL 4 MG PO TABS: 4.0000 mg | ORAL_TABLET | Freq: Four times a day (QID) | ORAL | Status: DC | PRN

## 2020-10-23 MED ORDER — FLUCONAZOLE 100MG IVPB
100.0000 mg | INTRAVENOUS | Status: DC
  Administered 2020-10-24 – 2020-10-25 (×2): 100 mg via INTRAVENOUS
  Filled 2020-10-23 (×2): qty 50

## 2020-10-23 MED ORDER — SULFAMETHOXAZOLE-TRIMETHOPRIM 400-80 MG/5ML IV SOLN
160.0000 mg | Freq: Once | INTRAVENOUS | Status: AC
  Administered 2020-10-23: 160 mg via INTRAVENOUS
  Filled 2020-10-23: qty 10

## 2020-10-23 MED ORDER — LACTATED RINGERS IV SOLN: INTRAVENOUS | Status: DC

## 2020-10-23 MED ORDER — ENOXAPARIN SODIUM 30 MG/0.3ML ~~LOC~~ SOLN
30.0000 mg | SUBCUTANEOUS | Status: DC
  Administered 2020-10-23 – 2020-10-28 (×5): 30 mg via SUBCUTANEOUS
  Filled 2020-10-23 (×5): qty 0.3

## 2020-10-23 MED ORDER — MIRTAZAPINE 15 MG PO TBDP
15.0000 mg | ORAL_TABLET | Freq: Every day | ORAL | Status: DC
  Administered 2020-10-23 – 2020-11-03 (×10): 15 mg via ORAL
  Filled 2020-10-23 (×12): qty 1

## 2020-10-23 MED ORDER — MORPHINE SULFATE (PF) 4 MG/ML IV SOLN
4.0000 mg | Freq: Once | INTRAVENOUS | Status: AC
  Administered 2020-10-23: 4 mg via INTRAVENOUS
  Filled 2020-10-23: qty 1

## 2020-10-23 MED ORDER — LACTATED RINGERS IV BOLUS (SEPSIS): 700.0000 mL | Freq: Once | INTRAVENOUS | Status: DC

## 2020-10-23 MED ORDER — OXYCODONE HCL 5 MG PO TABS
5.0000 mg | ORAL_TABLET | ORAL | Status: DC | PRN
  Administered 2020-10-23: 10 mg via ORAL
  Administered 2020-10-26: 5 mg via ORAL
  Filled 2020-10-23: qty 1
  Filled 2020-10-23: qty 2

## 2020-10-23 MED ORDER — LACTATED RINGERS IV BOLUS (SEPSIS)
1000.0000 mL | Freq: Once | INTRAVENOUS | Status: AC
  Administered 2020-10-24: 1000 mL via INTRAVENOUS

## 2020-10-23 MED ORDER — PANCRELIPASE (LIP-PROT-AMYL) 12000-38000 UNITS PO CPEP
36000.0000 [IU] | ORAL_CAPSULE | Freq: Three times a day (TID) | ORAL | Status: DC
  Administered 2020-10-24 – 2020-11-04 (×25): 36000 [IU] via ORAL
  Filled 2020-10-23 (×29): qty 3

## 2020-10-23 MED ORDER — ACETAMINOPHEN 650 MG RE SUPP: 650.0000 mg | Freq: Four times a day (QID) | RECTAL | Status: DC | PRN

## 2020-10-23 MED ORDER — PROSOURCE PLUS PO LIQD
30.0000 mL | Freq: Two times a day (BID) | ORAL | Status: DC
  Administered 2020-10-24 (×2): 30 mL via ORAL
  Filled 2020-10-23 (×2): qty 30

## 2020-10-23 MED ORDER — IOHEXOL 9 MG/ML PO SOLN: 1000.0000 mL | ORAL | Status: AC

## 2020-10-23 MED ORDER — ONDANSETRON HCL 4 MG/2ML IJ SOLN: 4.0000 mg | Freq: Four times a day (QID) | INTRAMUSCULAR | Status: DC | PRN

## 2020-10-23 MED ORDER — ACETAMINOPHEN 160 MG/5ML PO SOLN: 650.0000 mg | Freq: Four times a day (QID) | ORAL | Status: DC | PRN

## 2020-10-23 MED ORDER — HYDROMORPHONE HCL 1 MG/ML IJ SOLN
0.5000 mg | INTRAMUSCULAR | Status: DC | PRN
  Administered 2020-10-25 – 2020-11-01 (×3): 1 mg via INTRAVENOUS
  Administered 2020-11-01: 0.5 mg via INTRAVENOUS
  Administered 2020-11-02 – 2020-11-03 (×2): 1 mg via INTRAVENOUS
  Filled 2020-10-23 (×7): qty 1

## 2020-10-23 MED ORDER — SODIUM CHLORIDE 0.9 % IV SOLN
2.0000 g | Freq: Once | INTRAVENOUS | Status: AC
  Administered 2020-10-23: 2 g via INTRAVENOUS
  Filled 2020-10-23: qty 20

## 2020-10-23 MED ORDER — SODIUM ZIRCONIUM CYCLOSILICATE 5 G PO PACK
5.0000 g | PACK | Freq: Once | ORAL | Status: AC
  Administered 2020-10-23: 5 g via ORAL
  Filled 2020-10-23: qty 1

## 2020-10-23 MED ORDER — LACTATED RINGERS IV BOLUS (SEPSIS)
500.0000 mL | Freq: Once | INTRAVENOUS | Status: AC
  Administered 2020-10-23: 500 mL via INTRAVENOUS

## 2020-10-23 MED ORDER — METRONIDAZOLE IN NACL 5-0.79 MG/ML-% IV SOLN: 500.0000 mg | Freq: Once | INTRAVENOUS | Status: DC

## 2020-10-23 MED ORDER — PREDNISONE 5 MG PO TABS
5.0000 mg | ORAL_TABLET | Freq: Every day | ORAL | Status: DC
  Administered 2020-10-24 – 2020-11-04 (×9): 5 mg via ORAL
  Filled 2020-10-23 (×11): qty 1

## 2020-10-23 MED ORDER — ACETAMINOPHEN 325 MG PO TABS
650.0000 mg | ORAL_TABLET | Freq: Four times a day (QID) | ORAL | Status: DC | PRN
  Administered 2020-10-30 – 2020-11-04 (×5): 650 mg via ORAL
  Filled 2020-10-23 (×6): qty 2

## 2020-10-23 MED ORDER — FLUTICASONE PROPIONATE 50 MCG/ACT NA SUSP
2.0000 | Freq: Every day | NASAL | Status: DC | PRN
  Filled 2020-10-23: qty 16

## 2020-10-23 MED ORDER — FLUCONAZOLE 200 MG PO TABS
200.0000 mg | ORAL_TABLET | Freq: Once | ORAL | Status: AC
  Administered 2020-10-23: 200 mg via ORAL
  Filled 2020-10-23: qty 1

## 2020-10-23 MED ORDER — SULFAMETHOXAZOLE-TRIMETHOPRIM 400-80 MG/5ML IV SOLN
160.0000 mg | Freq: Two times a day (BID) | INTRAVENOUS | Status: DC
  Administered 2020-10-24 – 2020-10-25 (×4): 160 mg via INTRAVENOUS
  Filled 2020-10-23 (×6): qty 10

## 2020-10-23 MED ORDER — IOHEXOL 9 MG/ML PO SOLN
ORAL | Status: AC
  Administered 2020-10-23: 500 mL
  Filled 2020-10-23: qty 1000

## 2020-10-23 MED ORDER — NYSTATIN 100000 UNIT/GM EX POWD
Freq: Two times a day (BID) | CUTANEOUS | Status: DC
  Filled 2020-10-23: qty 15

## 2020-10-23 NOTE — Progress Notes (Signed)
[facility] Code Sepsis Completion Note:  Severe sepsis secondary to abdominal wound cellulitis, and oropharyngeal candidiasis. LA, 2.7 & 3.4. Second LA did not result until  2123 PM. Admitted to [facility] for further monitoring.

## 2020-10-23 NOTE — H&P (Signed)
History and Physical   [name] [name] EZM:[id number] DOB: December 30, 1930 DOA: 10/23/2020  Referring MD/NP/PA: Dr. Melina Copa, [place] PCP: Hoyt Koch, MD Outpatient Specialists: Dr. Zenia Resides (surgery)  Patient coming from: Home  Chief Complaint: Abdominal pain, nonhealing wound, failure to thrive  HPI: [name] [name] is an 87 y.o. 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 She was admitted for a few weeks in February - March 5035 with complications after exploratory laparotomy requiring TPN and G tube placement which was ultimately removed, the patient discharge to SNF for less than 2 weeks, then returned home with 24 hours care. With close surgery follow up she was referred to the wound care center and has been having a wound RN from [facility] performing BID wound dressing changes, though she has continued to struggle for weeks with poor appetite, now odynophagia, symptoms of fungal infection, and over the past few days worsening generalized weakness. A superficial culture taken of the wound showed polymicrobial infection with plans to treat with bactrim though the family stated she was unlikely to be able to swallow the pills as it would upset her stomach.   ED Course: Tachycardic to 104, tachypneic to 21/min, though afebrile, with leukocytosis and elevation of lactic acid to 2.7. CXR showed no acute findings and respiratory status has been stable on home oxygen. Labs appear hemoconcentrated from priors with hyperkalemia (5.3) and hypernatremia (146). Blood cultures were drawn, sepsis diagnosed and IV antibiotics given as well as 30cc/kg IVF. General surgery has evaluated the patient and medicine called for admission.    Review of Systems: No fever, chills, and per HPI. All others reviewed and are negative.    Past Medical History:  Diagnosis Date  . Allergic rhinitis   . Allergy   . CAD (coronary artery disease)    Mild CAD by cath 2008  . CHF (congestive heart failure) ([place])   . DJD (degenerative joint disease)    rheumatoid  . DVT (deep venous thrombosis) ([place])   . Dyslipidemia   . History of echocardiogram    Echo 12/2018: EF 60-65, mod asymmetric LVH, Gr 1 DD  . History of nuclear stress test    Myoview 5/17:  EF 74%, normal perfusion, low risk // Myoview 12/2018:  EF 89, very mild ischemia in inferoapical wall; Low Risk  . History of pulmonary embolism   . Hyperlipidemia   . Insomnia   . Osteoporosis   . Psoriasis   . Pulmonary fibrosis ([place])   . Rheumatoid arthritis [facility])    Past Surgical History:  Procedure Laterality Date  . ABDOMINAL HYSTERECTOMY  1974  . APPENDECTOMY  1959  . CATARACT EXTRACTION    . LAPAROTOMY N/A 08/23/2020   Procedure: EXPLORATORY LAPAROTOMY small bowel resection gastrostomy tube placement;  Surgeon: Dwan Bolt, MD;  Location: [facility] ORS;  Service: General;  Laterality: N/A;  . LEFT HEART CATHETERIZATION WITH CORONARY ANGIOGRAM N/A 06/18/2013   Procedure: LEFT HEART CATHETERIZATION WITH CORONARY ANGIOGRAM;  Surgeon: Peter M Martinique, MD;  Location: [facility] CATH LAB;  Service: Cardiovascular;  Laterality: N/A;  . TONSILLECTOMY  1941, 1951  . TOTAL KNEE ARTHROPLASTY  1997, 2007   Bilateral   - Remote smoker, lives alone with 24 hours care.  reports that she quit smoking about 47 years ago. Her  smoking use included cigarettes. She has a 10.00 pack-year smoking history. She has never used smokeless tobacco. She reports that she does not drink alcohol and does not use drugs. Allergies  Allergen Reactions  . Crestor [Rosuvastatin] Other (See Comments)    Muscle pain  . Lactose Intolerance (Gi) Other (See Comments)    Stomach upset  . Penicillins Hives  . Imuran [Azathioprine] Nausea And Vomiting   Family History  Problem Relation Age of Onset  . Kidney  disease Daughter 5  . Cancer Daughter   . Heart attack Father 63  . Alzheimer's disease Sister   . Heart disease Sister   . Alzheimer's disease Sister    - Family history otherwise reviewed and not pertinent.  Prior to Admission medications   Medication Sig Start Date End Date Taking? Authorizing Provider  cephALEXin (KEFLEX) 500 MG capsule Take 1 capsule (500 mg total) by mouth 2 (two) times daily for 7 days. 10/19/20 10/26/20  Hoyt Koch, MD  famotidine (PEPCID) 20 MG tablet Take 1 tablet (20 mg total) by mouth daily. Patient taking differently: Take 20 mg by mouth daily as needed for heartburn or indigestion. 09/08/20   Hosie Poisson, MD  fluconazole (DIFLUCAN) 150 MG tablet Take 1 tablet (150 mg total) by mouth every 3 (three) days. 10/20/20   Hoyt Koch, MD  fluticasone Asencion Islam) 50 MCG/ACT nasal spray Place 2 sprays into both nostrils daily as needed for allergies or rhinitis. 03/17/15   Hoyt Koch, MD  furosemide (LASIX) 20 MG tablet Take 1 tablet by mouth daily as needed for swelling. Patient taking differently: Take 20 mg by mouth daily as needed for fluid or edema. 12/11/18   Burtis Junes, NP  HYDROcodone-acetaminophen (NORCO) 10-325 MG tablet Take 1-2 tablets by mouth 2 (two) times daily as needed for up to 5 days. 10/19/20 10/24/20  Hoyt Koch, MD  InFLIXimab (REMICADE IV) Inject into the vein as directed. Every 2 months    [provider]  lipase/protease/amylase (CREON) 36000 UNITS CPEP capsule Take 1 capsule (36,000 Units total) by mouth 3 (three) times daily before meals. 10/09/20   Hoyt Koch, MD  mupirocin ointment (BACTROBAN) 2 % Apply 1 application topically 2 (two) times daily. 09/29/20   Hoyt Koch, MD  mycophenolate (CELLCEPT) 500 MG tablet 2 tabs bid Patient taking differently: Take 1,000 mg by mouth 2 (two) times daily. 01/27/20   Charlynne Cousins, MD  Nintedanib (OFEV) 100 MG CAPS Take 1 capsule (100  mg total) by mouth 2 (two) times daily. 08/18/20   Mannam, Hart Robinsons, MD  nitroGLYCERIN (NITROSTAT) 0.4 MG SL tablet Place 1 tablet (0.4 mg total) under the tongue every 5 (five) minutes as needed for chest pain. 06/29/20 09/27/20  Burnell Blanks, MD  nitroGLYCERIN (NITROSTAT) 0.4 MG SL tablet Place 0.4 mg under the tongue every 5 (five) minutes as needed for chest pain.    [provider]  Nutritional Supplements (,FEEDING SUPPLEMENT, PROSOURCE PLUS) liquid Take 30 mLs by mouth 2 (two) times daily between meals. 09/07/20   Hosie Poisson, MD  potassium chloride (KLOR-CON) 10 MEQ tablet Take 20 Meq twice daily x 2 days and then 20 Meq daily Patient taking differently: Take 10 mEq by mouth 2 (two) times daily. 09/29/20   Binnie Rail, MD  predniSONE (DELTASONE) 5 MG tablet TAKE 1 TABLET DAILY WITH BREAKFAST Patient taking differently: Take 5 mg by mouth daily with breakfast. 05/08/20   Marshell Garfinkel, MD  Physical Exam: Vitals:   10/23/20 1630 10/23/20 1645 10/23/20 1700 10/23/20 1715  BP: 113/77  135/81 135/81  Pulse: (!) 105 87 (!) 101 82  Resp: (!) 24 (!) 24 (!) 21 14  Temp:      TempSrc:      SpO2: 97% 100% 100% 98%   Constitutional: Elderly emaciated, very pleasant female Eyes: Lids and conjunctivae normal, PERRL ENMT: Mucous membranes are dry with red patches on tongue. Posterior pharynx clear of any exudate or lesions. Edentulous with exception of an implant without drown.  Neck: No masses, no thyromegaly Respiratory: Breathing 2L O2, tachypneic >20/min without accessory muscle use. Clear breath sounds to auscultation bilaterally Cardiovascular: Regular rate and rhythm, no murmurs, rubs, or gallops. No carotid bruits. No JVD. No LE edema. Palpable pedal pulses. Abdomen: Normoactive bowel sounds. Tenderness overlying wound as described below.  GU: No indwelling catheter Musculoskeletal: No clubbing / cyanosis. Onychomycosis noted. No joint deformity upper and lower  extremities. Good ROM, no contractures. Extremely decreased muscle bulk. Skin: Abdominal wound is deep with mild discharge and surrounding erythema, appearance of MASD.  Neurologic: CN II-XII grossly intact. No dysarthria. No focal deficits in motor strength or sensation in all extremities.  Psychiatric: Alert and oriented x3. Normal judgment and insight. Mood euthymic with congruent affect.   Labs on Admission: I have personally reviewed following labs and imaging studies  CBC: Recent Labs  Lab 10/23/20 1558  WBC 11.4*  NEUTROABS 5.1  HGB 11.9*  HCT 38.7  MCV 101.0*  PLT 643   Basic Metabolic Panel: Recent Labs  Lab 10/23/20 1558  NA 146*  K 5.3*  CL 110  CO2 26  GLUCOSE 92  BUN 13  CREATININE 0.83  CALCIUM 9.1   GFR: Estimated Creatinine Clearance: 29.5 mL/min (by C-G formula based on SCr of 0.83 mg/dL). Liver Function Tests: Recent Labs  Lab 10/23/20 1558  AST 32  ALT 15  ALKPHOS 121  BILITOT 0.9  PROT 7.0  ALBUMIN 2.7*   No results for input(s): LIPASE, AMYLASE in the last 168 hours. No results for input(s): AMMONIA in the last 168 hours. Coagulation Profile: Recent Labs  Lab 10/23/20 1558  INR 1.0   Cardiac Enzymes: No results for input(s): CKTOTAL, CKMB, CKMBINDEX, TROPONINI in the last 168 hours. BNP (last 3 results) Recent Labs    06/03/20 1001 09/29/20 1425  PROBNP 100 58.0   HbA1C: No results for input(s): HGBA1C in the last 72 hours. CBG: No results for input(s): GLUCAP in the last 168 hours. Lipid Profile: No results for input(s): CHOL, HDL, LDLCALC, TRIG, CHOLHDL, LDLDIRECT in the last 72 hours. Thyroid Function Tests: No results for input(s): TSH, T4TOTAL, FREET4, T3FREE, THYROIDAB in the last 72 hours. Anemia Panel: No results for input(s): VITAMINB12, FOLATE, FERRITIN, TIBC, IRON, RETICCTPCT in the last 72 hours. Urine analysis:    Component Value Date/Time   COLORURINE YELLOW 10/16/2020 2145   APPEARANCEUR CLEAR 10/16/2020  2145   LABSPEC >1.046 (H) 10/16/2020 2145   PHURINE 5.0 10/16/2020 2145   GLUCOSEU NEGATIVE 10/16/2020 2145   GLUCOSEU NEGATIVE 01/03/2020 1434   HGBUR SMALL (A) 10/16/2020 2145   BILIRUBINUR NEGATIVE 10/16/2020 2145   BILIRUBINUR N 01/24/2014 1354   KETONESUR 5 (A) 10/16/2020 2145   PROTEINUR NEGATIVE 10/16/2020 2145   UROBILINOGEN 4.0 (A) 01/03/2020 1434   NITRITE NEGATIVE 10/16/2020 2145   LEUKOCYTESUR NEGATIVE 10/16/2020 2145    Recent Results (from the past 240 hour(s))  Aerobic/Anaerobic Culture w Gram Stain (surgical/deep wound)  Status: None   Collection Time: 10/16/20  9:34 AM   Specimen: Abdomen  Result Value Ref Range Status   Specimen Description   Final    ABDOMEN LEFT G TUBE SITE Performed at [facility] [address], [place], [place] [address]    Special Requests   Final    NONE Performed at [facility], [place] [address], [place], [place] [address]    Gram Stain   Final    RARE WBC PRESENT, PREDOMINANTLY PMN RARE GRAM POSITIVE COCCI RARE GRAM VARIABLE ROD    Culture   Final    RARE STAPHYLOCOCCUS AUREUS FEW ENTEROBACTER CLOACAE FEW MORGANELLA MORGANII NO ANAEROBES ISOLATED Performed at [facility], [place] [address], [place], [place] [address]    Report Status 10/22/2020 FINAL  Final   Organism ID, Bacteria STAPHYLOCOCCUS AUREUS  Final   Organism ID, Bacteria ENTEROBACTER CLOACAE  Final   Organism ID, Bacteria MORGANELLA MORGANII  Final      Susceptibility   Enterobacter cloacae - MIC*    CEFAZOLIN >=64 RESISTANT Resistant     CEFEPIME <=0.12 SENSITIVE Sensitive     CEFTAZIDIME <=1 SENSITIVE Sensitive     CIPROFLOXACIN <=0.25 SENSITIVE Sensitive     GENTAMICIN <=1 SENSITIVE Sensitive     IMIPENEM <=0.25 SENSITIVE Sensitive     TRIMETH/SULFA <=20 SENSITIVE Sensitive     PIP/TAZO 8 SENSITIVE Sensitive     * FEW ENTEROBACTER CLOACAE   Morganella morganii - MIC*    AMPICILLIN >=32 RESISTANT Resistant      CEFAZOLIN >=64 RESISTANT Resistant     CEFTAZIDIME >=64 RESISTANT Resistant     CIPROFLOXACIN <=0.25 SENSITIVE Sensitive     GENTAMICIN <=1 SENSITIVE Sensitive     IMIPENEM <=0.25 SENSITIVE Sensitive     TRIMETH/SULFA <=20 SENSITIVE Sensitive     AMPICILLIN/SULBACTAM >=32 RESISTANT Resistant     PIP/TAZO <=4 SENSITIVE Sensitive     * FEW MORGANELLA MORGANII   Staphylococcus aureus - MIC*    CIPROFLOXACIN >=8 RESISTANT Resistant     ERYTHROMYCIN <=0.25 SENSITIVE Sensitive     GENTAMICIN <=0.5 SENSITIVE Sensitive     OXACILLIN >=4 RESISTANT Resistant     TETRACYCLINE <=1 SENSITIVE Sensitive     VANCOMYCIN <=0.5 SENSITIVE Sensitive     TRIMETH/SULFA <=10 SENSITIVE Sensitive     CLINDAMYCIN <=0.25 SENSITIVE Sensitive     RIFAMPIN <=0.5 SENSITIVE Sensitive     Inducible Clindamycin NEGATIVE Sensitive     * RARE STAPHYLOCOCCUS AUREUS  Resp Panel by RT-PCR (Flu A&B, Covid) Nasopharyngeal Swab     Status: None   Collection Time: 10/23/20  5:34 PM   Specimen: Nasopharyngeal Swab; Nasopharyngeal(NP) swabs in vial transport medium  Result Value Ref Range Status   SARS Coronavirus 2 by RT PCR NEGATIVE NEGATIVE Final    Comment: (NOTE) SARS-CoV-2 target nucleic acids are NOT DETECTED.  The SARS-CoV-2 RNA is generally detectable in upper respiratory specimens during the acute phase of infection. The lowest concentration of SARS-CoV-2 viral copies this assay can detect is 138 copies/mL. A negative result does not preclude SARS-Cov-2 infection and should not be used as the sole basis for treatment or other patient management decisions. A negative result may occur with  improper specimen collection/handling, submission of specimen other than nasopharyngeal swab, presence of viral mutation(s) within the areas targeted by this assay, and inadequate number of viral copies(<138 copies/mL). A negative result must be combined with clinical observations, patient history, and  epidemiological information. The  expected result is Negative.  Fact Sheet for Patients:  EntrepreneurPulse.com.au  Fact Sheet for Healthcare Providers:  IncredibleEmployment.be  This test is no t yet approved or cleared by the [place] FDA and  has been authorized for detection and/or diagnosis of SARS-CoV-2 by FDA under an Emergency Use Authorization (EUA). This EUA will remain  in effect (meaning this test can be used) for the duration of the COVID-19 declaration under Section 564(b)(1) of the Act, 21 U.S.C.section 360bbb-3(b)(1), unless the authorization is terminated  or revoked sooner.       Influenza A by PCR NEGATIVE NEGATIVE Final   Influenza B by PCR NEGATIVE NEGATIVE Final    Comment: (NOTE) The Xpert Xpress SARS-CoV-2/FLU/RSV plus assay is intended as an aid in the diagnosis of influenza from Nasopharyngeal swab specimens and should not be used as a sole basis for treatment. Nasal washings and aspirates are unacceptable for Xpert Xpress SARS-CoV-2/FLU/RSV testing.  Fact Sheet for Patients: EntrepreneurPulse.com.au  Fact Sheet for Healthcare Providers: IncredibleEmployment.be  This test is not yet approved or cleared by the [place] FDA and has been authorized for detection and/or diagnosis of SARS-CoV-2 by FDA under an Emergency Use Authorization (EUA). This EUA will remain in effect (meaning this test can be used) for the duration of the COVID-19 declaration under Section 564(b)(1) of the Act, 21 U.S.C. section 360bbb-3(b)(1), unless the authorization is terminated or revoked.  Performed at [facility], [place] [address], [place], [place] [address]      Radiological Exams on Admission: DG Chest Port 1 View  Result Date: 10/23/2020 CLINICAL DATA:  Sepsis, G-tube placed in February 2022, nonhealing wound EXAM: PORTABLE CHEST 1 VIEW COMPARISON:  Portable exam  1609 hours compared to 08/24/2020 FINDINGS: Normal heart size, mediastinal contours, and pulmonary vascularity. Atherosclerotic calcification aorta. Severe pulmonary fibrosis with peripheral and basilar predominance unchanged since an earlier study of 02/12/2020. Underlying emphysematous and bronchitic changes. No definite acute infiltrate, pleural effusion, or pneumothorax. Dextroconvex thoracic scoliosis. IMPRESSION: Changes of COPD and severe pulmonary fibrosis. No acute abnormalities. Aortic Atherosclerosis (ICD10-I70.0) and Emphysema (ICD10-J43.9). Electronically Signed   By: Lavonia Dana M.D.   On: 10/23/2020 16:32   Assessment/Plan Active Problems:   CAD (coronary artery disease)   RA (rheumatoid arthritis) (HCC)   Chronic respiratory failure with hypoxia (HCC)   Chronic diastolic CHF (congestive heart failure) (HCC)   Iron deficiency anemia   Personal history of PE (pulmonary embolism)   Unintentional weight loss   ILD (interstitial lung disease) (HCC)   Protein-calorie malnutrition, severe   Severe sepsis (HCC)   Severe sepsis: The patient was tachypneic, tachycardic with leukocytosis and increasing drainage from abdominal wound suggestive of cellulitis. Lactic acid 2.7 indicating end organ dysfunction. - Continue IV antibiotics. Based on culture data and hoping to minimize nephrotoxins (e.g. zosyn, vancomycin), will order IV bactrim.  - Monitor culture data  Thrush:  - IV diflucan while unable to tolerate po reliably.   Nonhealing abdominal wounds, MASD:  - Unable to stop prednisone. Hold other UIP medications. - Optimize nutrition. - Wound care consulted. For now surgery recommends BID W > D.  - Keep area as dry as possible, nystatin powder. - Pt's family reports output from G tube wound with PO intake. There was no evidence of communication/gastrocutaneous fistula on recent CT, though this report began after that CT. Will repeat CT.  Dehydration, hypernatremia, severe  protein-calorie malnutrition, failure to thrive, generalized weakness: Appears emaciated. Ketonuria without glucosuria.  - Will check  full complement of labs, request dietitian evaluation and SLP evaluation given reports of difficulty swallowing. - Pt reportedly did well with pharmacologic appetite stimulant in the past which we can order.  - Continue IVF. Last EF preserved, indeterminate diastolic function, and no evidence of volume overload.  - Will continue to discuss TPN initiation, possibly insert PICC and initiate TPN 4/16. - PT, OT, TOC consulted  Hyperkalemic AKI: Degree of sarcopenia would suggest a creatinine of 0.83 represent considerable renal impairment.  [name] - IVF  Chronic hypoxic respiratory failure due to UIP: No infiltrate on CXR. At baseline.  - Continue 2L O2.  Goals of care: These were introduced at admission with my fear that the combination of malnutrition, declining functional status, advanced age, and chronic immunosuppression suggest these wounds may never heal. The patient and family are very clear in their wishes at this time, however. They would like any and all available aggressive interventions with curative intent. This includes prolonged TPN, intensive wound care and physical therapy, IV antibiotics, and hospital monitoring.  - Full code confirmed. - I introduced palliative care and will consult them. They had an appointment with [facility] as outpatient pending.   DVT prophylaxis: Lovenox 30mg  (history of DVT/PE)  Code Status: Full  Family Communication: HCPOAs at bedside Disposition Plan: Uncertain Consults called: Surgery, Palliative care  Admission status: Inpatient    Patrecia Pour, MD [facility] [email or web address] 10/23/2020, 7:04 PM

## 2020-10-23 NOTE — ED Triage Notes (Signed)
Ems brings pt in from home. Family states pt had a G-tube placed in February following a bowel obstruction. Family states the wounds have not healed. Reports finding out there are 3 different bacteria in the wounds and wants her evaluated.

## 2020-10-23 NOTE — Progress Notes (Signed)
Pharmacy Antibiotic Note  [name] [name] is a 87 y.o. female admitted on 10/23/2020 with severe sepsis secondary to abdominal wound cellulitis, and oropharyngeal candidiasis. Pharmacy has been consulted for Sulfamethoxazole/Trimethoprim and Fluconazole dosing.  Plan:  Sulfamethoxazole/Trimethoprim 160mg  (based on Trimethoprim component) IV q12h. Noted K+ slightly elevated on admission (5.3) with dose of Lokelma ordered per MD. If K+ remains elevated, will likely need to use alternative antibiotic.   Fluconazole 200mg  x 1 given in the ED. Continue with Fluconazole 100mg  IV daily (dose adjusted for CrCl < 50 ml/min).  Monitor renal function, potassium, cultures, clinical course.   Weight: 40.3 kg (88 lb 13.5 oz)  Temp (24hrs), Avg:97.7 F (36.5 C), Min:97.6 F (36.4 C), Max:97.7 F (36.5 C)  Recent Labs  Lab 10/23/20 1558 10/23/20 1600  WBC 11.4*  --   CREATININE 0.83  --   LATICACIDVEN  --  2.7*    Estimated Creatinine Clearance: 29.8 mL/min (by C-G formula based on SCr of 0.83 mg/dL).    Allergies  Allergen Reactions  . Crestor [Rosuvastatin] Other (See Comments)    Muscle pain  . Lactose Intolerance (Gi) Other (See Comments)    Stomach upset  . Penicillins Hives  . Imuran [Azathioprine] Nausea And Vomiting    Antimicrobials this admission: 4/15 Ceftriaxone x 1 4/15 Metronidazole x 1 4/15 Sulfamethoxazole/Trimethoprim >> 4/15 Fluconazole >>  Dose adjustments this admission: --  Microbiology results: Previous culture: 4/8 abdomen (Left G tube site); rare MRSA, few Enterobacter cloacae, few Morganella morganii   4/15 BCx: sent 4/15 UCx: ordered  4/15 Resp panel: COVID negative, Influenza A/B negative   Thank you for allowing pharmacy to be a part of this patient's care.   Lindell Spar, PharmD, BCPS Clinical Pharmacist  10/23/2020 8:30 PM

## 2020-10-23 NOTE — Sepsis Progress Note (Signed)
ED RN Claiborne Billings is preparing to transfer pt to floor. She will remind next RN to give flagyl, 747ml bolus LR and then have her call lab to repeat lactic acid.

## 2020-10-23 NOTE — Progress Notes (Signed)
ED Antimicrobial Stewardship Positive Culture Follow Up   [name] [name] is an 87 y.o. female who presented to [facility] on 10/16/2020 with a chief complaint of  Chief Complaint  Patient presents with  . Abdominal Pain    Recent Results (from the past 720 hour(s))  Aerobic/Anaerobic Culture w Gram Stain (surgical/deep wound)     Status: None   Collection Time: 10/16/20  9:34 AM   Specimen: Abdomen  Result Value Ref Range Status   Specimen Description   Final    ABDOMEN LEFT G TUBE SITE Performed at [facility] [address], [place], [place] [address]    Special Requests   Final    NONE Performed at [facility], [place] [address], [place], [place] [address]    Gram Stain   Final    RARE WBC PRESENT, PREDOMINANTLY PMN RARE GRAM POSITIVE COCCI RARE GRAM VARIABLE ROD    Culture   Final    RARE STAPHYLOCOCCUS AUREUS FEW ENTEROBACTER CLOACAE FEW MORGANELLA MORGANII NO ANAEROBES ISOLATED Performed at [facility], [place] [address], [place], [place] [address]    Report Status 10/22/2020 FINAL  Final   Organism ID, Bacteria STAPHYLOCOCCUS AUREUS  Final   Organism ID, Bacteria ENTEROBACTER CLOACAE  Final   Organism ID, Bacteria MORGANELLA MORGANII  Final      Susceptibility   Enterobacter cloacae - MIC*    CEFAZOLIN >=64 RESISTANT Resistant     CEFEPIME <=0.12 SENSITIVE Sensitive     CEFTAZIDIME <=1 SENSITIVE Sensitive     CIPROFLOXACIN <=0.25 SENSITIVE Sensitive     GENTAMICIN <=1 SENSITIVE Sensitive     IMIPENEM <=0.25 SENSITIVE Sensitive     TRIMETH/SULFA <=20 SENSITIVE Sensitive     PIP/TAZO 8 SENSITIVE Sensitive     * FEW ENTEROBACTER CLOACAE   Morganella morganii - MIC*    AMPICILLIN >=32 RESISTANT Resistant     CEFAZOLIN >=64 RESISTANT Resistant     CEFTAZIDIME >=64 RESISTANT Resistant     CIPROFLOXACIN <=0.25 SENSITIVE Sensitive     GENTAMICIN <=1 SENSITIVE Sensitive     IMIPENEM <=0.25 SENSITIVE Sensitive      TRIMETH/SULFA <=20 SENSITIVE Sensitive     AMPICILLIN/SULBACTAM >=32 RESISTANT Resistant     PIP/TAZO <=4 SENSITIVE Sensitive     * FEW MORGANELLA MORGANII   Staphylococcus aureus - MIC*    CIPROFLOXACIN >=8 RESISTANT Resistant     ERYTHROMYCIN <=0.25 SENSITIVE Sensitive     GENTAMICIN <=0.5 SENSITIVE Sensitive     OXACILLIN >=4 RESISTANT Resistant     TETRACYCLINE <=1 SENSITIVE Sensitive     VANCOMYCIN <=0.5 SENSITIVE Sensitive     TRIMETH/SULFA <=10 SENSITIVE Sensitive     CLINDAMYCIN <=0.25 SENSITIVE Sensitive     RIFAMPIN <=0.5 SENSITIVE Sensitive     Inducible Clindamycin NEGATIVE Sensitive     * RARE STAPHYLOCOCCUS AUREUS    ED culture report showing multiple organisms from an abdominal wound. Patient is scheduled to see wound care this morning, I reached out to Dr. Heber Flatwoods who will be seeing her this morning 4/15 for these wounds and recommended treating with Bactrim 1 DS BID. She was in agreement and will make an assessment and prescribe antibiotics for her.   Phillis Haggis 10/23/2020, 8:20 AM Clinical Pharmacist Monday - Friday phone -  [phone] Saturday - Sunday phone - [phone]

## 2020-10-23 NOTE — ED Provider Notes (Signed)
[facility] DEPT Provider Note   CSN: [id number] Arrival date & time: 10/23/20  1509     History Chief Complaint  Patient presents with  . Wound Infection    [name] [name] is a 87 y.o. female.  She is here for evaluation of nonhealing abdominal wounds.  She had surgery in February and has had slow to heal abdominal wounds.  She is on oral pain medication which helps somewhat.  The wounds have grown out some bacteria and patient had been on Keflex and now is on Bactrim.  She denies any fevers.  She said the wounds are leaking fluid.  She follows with wound clinic  The history is provided by the patient.  Abdominal Pain Pain location:  Generalized Pain quality: aching   Pain radiates to:  Does not radiate Pain severity:  Moderate Onset quality:  Gradual Timing:  Constant Progression:  Unchanged Chronicity:  New Context: not trauma   Relieved by:  Nothing Worsened by:  Position changes and palpation Ineffective treatments: narcotic pain meds. Associated symptoms: shortness of breath (baseline)   Associated symptoms: no chest pain, no cough, no dysuria, no fever, no sore throat and no vomiting        Past Medical History:  Diagnosis Date  . Allergic rhinitis   . Allergy   . CAD (coronary artery disease)    Mild CAD by cath 2008  . CHF (congestive heart failure) ([place])   . DJD (degenerative joint disease)    rheumatoid  . DVT (deep venous thrombosis) ([place])   . Dyslipidemia   . History of echocardiogram    Echo 12/2018: EF 60-65, mod asymmetric LVH, Gr 1 DD  . History of nuclear stress test    Myoview 5/17:  EF 74%, normal perfusion, low risk // Myoview 12/2018:  EF 89, very mild ischemia in inferoapical wall; Low Risk  . History of pulmonary embolism   . Hyperlipidemia   . Insomnia   . Osteoporosis   . Psoriasis   . Pulmonary fibrosis ([place])   . Rheumatoid arthritis [facility])     Patient Active Problem List   Diagnosis Date Noted  .  Abdominal pain 10/20/2020  . Diarrhea 10/02/2020  . Pressure injury of skin 09/06/2020  . Acute on chronic respiratory failure with hypoxemia ([place]) 08/23/2020  . Protein-calorie malnutrition, severe 08/20/2020  . Fall 01/25/2020  . Other fatigue 01/03/2020  . Left shoulder pain 01/03/2020  . Urinary frequency 01/03/2020  . Acute otalgia, left 09/06/2019  . Unintentional weight loss 07/18/2019  . Personal history of PE (pulmonary embolism) 04/23/2019  . Headache 02/11/2019  . Iron deficiency anemia 12/21/2018  . Cold sore 10/23/2018  . Blood in stool 09/14/2018  . 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 Therapeutic drug monitoring 07/17/2018  . Chronic diastolic CHF (congestive heart failure) ([place]) 12/19/2017  . Rash 08/11/2017  . Chronic respiratory failure with hypoxia ([place]) 03/30/2017  . Angular cheilitis 03/08/2017  . Leg pain 10/25/2016  . Back pain 08/05/2016  . RA (rheumatoid arthritis) ([place]) 03/31/2015  . Allergic rhinitis   . Varicose vein 09/11/2014  . ILD (interstitial lung disease) ([place]) 06/25/2012  . Chest pain 02/27/2012  . CAD (coronary artery disease) 02/27/2012  . Pruritus 08/18/2009  . Postinflammatory pulmonary fibrosis / RA ILD  06/30/2008  . Constipation 01/03/2008  . Dyslipidemia 06/16/2007    Past Surgical History:  Procedure Laterality Date  . ABDOMINAL HYSTERECTOMY  1974  . APPENDECTOMY  1959  . CATARACT EXTRACTION    .  LAPAROTOMY N/A 08/23/2020   Procedure: EXPLORATORY LAPAROTOMY small bowel resection gastrostomy tube placement;  Surgeon: Dwan Bolt, MD;  Location: [facility] ORS;  Service: General;  Laterality: N/A;  . LEFT HEART CATHETERIZATION WITH CORONARY ANGIOGRAM N/A 06/18/2013   Procedure: LEFT HEART CATHETERIZATION WITH CORONARY ANGIOGRAM;  Surgeon: Peter M Martinique, MD;  Location: [facility] CATH LAB;  Service: Cardiovascular;  Laterality: N/A;  . TONSILLECTOMY  1941, 1951  . TOTAL KNEE ARTHROPLASTY  1997, 2007   Bilateral     OB History    Gravida  4    Para  2   Term  2   Preterm      AB      Living  0     SAB      IAB      Ectopic      Multiple      Live Births              Family History  Problem Relation Age of Onset  . Kidney disease Daughter 5  . Cancer Daughter   . Heart attack Father 33  . Alzheimer's disease Sister   . Heart disease Sister   . Alzheimer's disease Sister     Social History   Tobacco Use  . Smoking status: Former Smoker    Packs/day: 0.50    Years: 20.00    Pack years: 10.00    Types: Cigarettes    Quit date: 07/11/1973    Years since quitting: 47.3  . Smokeless tobacco: Never Used  Vaping Use  . Vaping Use: Never used  Substance Use Topics  . Alcohol use: No  . Drug use: No    Home Medications Prior to Admission medications   Medication Sig Start Date End Date Taking? Authorizing Provider  cephALEXin (KEFLEX) 500 MG capsule Take 1 capsule (500 mg total) by mouth 2 (two) times daily for 7 days. 10/19/20 10/26/20  Hoyt Koch, MD  famotidine (PEPCID) 20 MG tablet Take 1 tablet (20 mg total) by mouth daily. Patient taking differently: Take 20 mg by mouth daily as needed for heartburn or indigestion. 09/08/20   Hosie Poisson, MD  fluconazole (DIFLUCAN) 150 MG tablet Take 1 tablet (150 mg total) by mouth every 3 (three) days. 10/20/20   Hoyt Koch, MD  fluticasone Asencion Islam) 50 MCG/ACT nasal spray Place 2 sprays into both nostrils daily as needed for allergies or rhinitis. 03/17/15   Hoyt Koch, MD  furosemide (LASIX) 20 MG tablet Take 1 tablet by mouth daily as needed for swelling. Patient taking differently: Take 20 mg by mouth daily as needed for fluid or edema. 12/11/18   Burtis Junes, NP  HYDROcodone-acetaminophen (NORCO) 10-325 MG tablet Take 1-2 tablets by mouth 2 (two) times daily as needed for up to 5 days. 10/19/20 10/24/20  Hoyt Koch, MD  InFLIXimab (REMICADE IV) Inject into the vein as directed. Every 2 months    [provider]  lipase/protease/amylase (CREON) 36000 UNITS CPEP capsule Take 1 capsule (36,000 Units total) by mouth 3 (three) times daily before meals. 10/09/20   Hoyt Koch, MD  mupirocin ointment (BACTROBAN) 2 % Apply 1 application topically 2 (two) times daily. 09/29/20   Hoyt Koch, MD  mycophenolate (CELLCEPT) 500 MG tablet 2 tabs bid Patient taking differently: Take 1,000 mg by mouth 2 (two) times daily. 01/27/20   Charlynne Cousins, MD  Nintedanib (OFEV) 100 MG CAPS Take 1 capsule (100 mg total) by  mouth 2 (two) times daily. 08/18/20   Mannam, Hart Robinsons, MD  nitroGLYCERIN (NITROSTAT) 0.4 MG SL tablet Place 1 tablet (0.4 mg total) under the tongue every 5 (five) minutes as needed for chest pain. 06/29/20 09/27/20  Burnell Blanks, MD  nitroGLYCERIN (NITROSTAT) 0.4 MG SL tablet Place 0.4 mg under the tongue every 5 (five) minutes as needed for chest pain.    [provider]  Nutritional Supplements (,FEEDING SUPPLEMENT, PROSOURCE PLUS) liquid Take 30 mLs by mouth 2 (two) times daily between meals. 09/07/20   Hosie Poisson, MD  potassium chloride (KLOR-CON) 10 MEQ tablet Take 20 Meq twice daily x 2 days and then 20 Meq daily Patient taking differently: Take 10 mEq by mouth 2 (two) times daily. 09/29/20   Binnie Rail, MD  predniSONE (DELTASONE) 5 MG tablet TAKE 1 TABLET DAILY WITH BREAKFAST Patient taking differently: Take 5 mg by mouth daily with breakfast. 05/08/20   Mannam, Hart Robinsons, MD    Allergies    Crestor [rosuvastatin], Lactose intolerance (gi), Penicillins, and Imuran [azathioprine]  Review of Systems   Review of Systems  Constitutional: Negative for fever.  HENT: Negative for sore throat.   Eyes: Negative for visual disturbance.  Respiratory: Positive for shortness of breath (baseline). Negative for cough.   Cardiovascular: Negative for chest pain.  Gastrointestinal: Positive for abdominal pain. Negative for vomiting.  Genitourinary: Negative  for dysuria.  Musculoskeletal: Negative for neck pain.  Skin: Positive for wound. Negative for rash.  Neurological: Negative for headaches.    Physical Exam Updated Vital Signs BP 113/77   Pulse (!) 105   Temp 97.7 F (36.5 C) (Oral)   Resp (!) 24   SpO2 97%   Physical Exam Vitals and nursing note reviewed.  Constitutional:      General: She is not in acute distress.    Appearance: Normal appearance. She is well-developed. She is cachectic.  HENT:     Head: Normocephalic and atraumatic.  Eyes:     Conjunctiva/sclera: Conjunctivae normal.  Cardiovascular:     Rate and Rhythm: Normal rate and regular rhythm.     Heart sounds: No murmur heard.   Pulmonary:     Effort: Pulmonary effort is normal. No respiratory distress.     Breath sounds: Normal breath sounds.  Abdominal:     Palpations: Abdomen is soft.     Tenderness: There is abdominal tenderness. There is no guarding or rebound.     Comments: Multiple abdominal wounds and some weeping skin.  Bases of the wound show the fascia is intact.  She has some leaking from her G-tube site causing some maceration of the skin.  Musculoskeletal:        General: No deformity or signs of injury. Normal range of motion.     Cervical back: Neck supple.  Skin:    General: Skin is warm and dry.  Neurological:     General: No focal deficit present.     Mental Status: She is alert.       ED Results / Procedures / Treatments   Labs (all labs ordered are listed, but only abnormal results are displayed) Labs Reviewed  LACTIC ACID, PLASMA - Abnormal; Notable for the following components:      Result Value   Lactic Acid, Venous 2.7 (*)    All other components within normal limits  LACTIC ACID, PLASMA - Abnormal; Notable for the following components:   Lactic Acid, Venous 3.4 (*)    All other components within  normal limits  COMPREHENSIVE METABOLIC PANEL - Abnormal; Notable for the following components:   Sodium 146 (*)     Potassium 5.3 (*)    Albumin 2.7 (*)    All other components within normal limits  CBC WITH DIFFERENTIAL/PLATELET - Abnormal; Notable for the following components:   WBC 11.4 (*)    RBC 3.83 (*)    Hemoglobin 11.9 (*)    MCV 101.0 (*)    RDW 20.9 (*)    Lymphs Abs 5.0 (*)    Monocytes Absolute 1.2 (*)    All other components within normal limits  COMPREHENSIVE METABOLIC PANEL - Abnormal; Notable for the following components:   Glucose, Bld 69 (*)    Calcium 8.3 (*)    Total Protein 5.5 (*)    Albumin 2.1 (*)    All other components within normal limits  MAGNESIUM - Abnormal; Notable for the following components:   Magnesium 1.5 (*)    All other components within normal limits  VITAMIN B12 - Abnormal; Notable for the following components:   Vitamin B-12 1,231 (*)    All other components within normal limits  PREALBUMIN - Abnormal; Notable for the following components:   Prealbumin 8.0 (*)    All other components within normal limits  CBC - Abnormal; Notable for the following components:   RDW 20.9 (*)    All other components within normal limits  RESP PANEL BY RT-PCR (FLU A&B, COVID) ARPGX2  URINE CULTURE  CULTURE, BLOOD (ROUTINE X 2)  CULTURE, BLOOD (ROUTINE X 2)  PROTIME-INR  APTT  PHOSPHORUS  URINALYSIS, ROUTINE W REFLEX MICROSCOPIC  VITAMIN B1  VITAMIN B6    EKG EKG Interpretation  Date/Time:  Friday October 23 2020 16:07:31 EDT Ventricular Rate:  91 PR Interval:  156 QRS Duration: 83 QT Interval:  358 QTC Calculation: 441 R Axis:   44 Text Interpretation: Sinus rhythm Probable left atrial enlargement Abnormal R-wave progression, early transition No significant change since prior 4/22 Confirmed by Aletta Edouard [phone]) on 10/23/2020 4:13:13 PM   Radiology DG Chest Port 1 View  Result Date: 10/23/2020 CLINICAL DATA:  Sepsis, G-tube placed in February 2022, nonhealing wound EXAM: PORTABLE CHEST 1 VIEW COMPARISON:  Portable exam 1609 hours compared to  08/24/2020 FINDINGS: Normal heart size, mediastinal contours, and pulmonary vascularity. Atherosclerotic calcification aorta. Severe pulmonary fibrosis with peripheral and basilar predominance unchanged since an earlier study of 02/12/2020. Underlying emphysematous and bronchitic changes. No definite acute infiltrate, pleural effusion, or pneumothorax. Dextroconvex thoracic scoliosis. IMPRESSION: Changes of COPD and severe pulmonary fibrosis. No acute abnormalities. Aortic Atherosclerosis (ICD10-I70.0) and Emphysema (ICD10-J43.9). Electronically Signed   By: Lavonia Dana M.D.   On: 10/23/2020 16:32    Procedures .Critical Care Performed by: Hayden Rasmussen, MD Authorized by: Hayden Rasmussen, MD   Critical care provider statement:    Critical care time (minutes):  45   Critical care time was exclusive of:  Separately billable procedures and treating other patients   Critical care was necessary to treat or prevent imminent or life-threatening deterioration of the following conditions:  Sepsis   Critical care was time spent personally by me on the following activities:  Discussions with consultants, evaluation of patient's response to treatment, examination of patient, ordering and performing treatments and interventions, ordering and review of laboratory studies, ordering and review of radiographic studies, pulse oximetry, re-evaluation of patient's condition, obtaining history from patient or surrogate, review of old charts and development of treatment plan with patient or  surrogate     Medications Ordered in ED Medications  lactated ringers bolus 700 mL (has no administration in time range)  lactated ringers infusion ( Intravenous New Bag/Given 10/23/20 2119)  oxyCODONE (Oxy IR/ROXICODONE) immediate release tablet 5-10 mg (10 mg Oral Given 10/23/20 2230)  HYDROmorphone (DILAUDID) injection 0.5-1 mg (has no administration in time range)  predniSONE (DELTASONE) tablet 5 mg (5 mg Oral Given  10/24/20 0833)  lipase/protease/amylase (CREON) capsule 36,000 Units (36,000 Units Oral Given 10/24/20 0833)  (feeding supplement) PROSource Plus liquid 30 mL (has no administration in time range)  fluticasone (FLONASE) 50 MCG/ACT nasal spray 2 spray (has no administration in time range)  mirtazapine (REMERON SOL-TAB) disintegrating tablet 15 mg (15 mg Oral Given 10/23/20 2230)  enoxaparin (LOVENOX) injection 30 mg (30 mg Subcutaneous Given 10/23/20 2121)  ondansetron (ZOFRAN) tablet 4 mg (has no administration in time range)    Or  ondansetron (ZOFRAN) injection 4 mg (has no administration in time range)  nystatin (MYCOSTATIN/NYSTOP) topical powder ( Topical Given 10/23/20 2232)  acetaminophen (TYLENOL) tablet 650 mg (has no administration in time range)    Or  acetaminophen (TYLENOL) suppository 650 mg (has no administration in time range)  fluconazole (DIFLUCAN) IVPB 100 mg (has no administration in time range)  sulfamethoxazole-trimethoprim (BACTRIM) 160 mg in dextrose 5 % 250 mL IVPB (has no administration in time range)  iohexol (OMNIPAQUE) 9 MG/ML oral solution 1,000 mL (0 mLs Oral Hold 10/24/20 0106)  magnesium sulfate IVPB 2 g 50 mL (2 g Intravenous New Bag/Given 10/24/20 0829)  lactated ringers bolus 500 mL (500 mLs Intravenous New Bag/Given 10/23/20 1608)  fluconazole (DIFLUCAN) tablet 200 mg (200 mg Oral Given 10/23/20 1608)  cefTRIAXone (ROCEPHIN) 2 g in sodium chloride 0.9 % 100 mL IVPB (2 g Intravenous New Bag/Given 10/23/20 1735)  morphine 4 MG/ML injection 4 mg (4 mg Intravenous Given 10/23/20 1730)  sodium zirconium cyclosilicate (LOKELMA) packet 5 g (5 g Oral Given 10/23/20 2121)  sulfamethoxazole-trimethoprim (BACTRIM) 160 mg in dextrose 5 % 250 mL IVPB (160 mg Intravenous New Bag/Given 10/23/20 2120)  iohexol (OMNIPAQUE) 9 MG/ML oral solution (500 mLs  Contrast Given 10/23/20 2311)  lactated ringers bolus 1,000 mL (1,000 mLs Intravenous New Bag/Given 10/24/20 0019)    ED Course  I  have reviewed the triage vital signs and the nursing notes.  Pertinent labs & imaging results that were available during my care of the patient were reviewed by me and considered in my medical decision making (see chart for details).  Clinical Course as of 10/24/20 0835  Fri Oct 23, 2020  1635 Chest x-ray with COPD and pulmonary fibrosis no acute infiltrates. [MB]  2595 On review of micro she had wound cultures done on 8 April that grew out staph aureus Enterobacter and Morganella, which were sensitive to Bactrim [MB]  1705 Lactate coming back elevated at 2.7.  She has 2 sirs criteria so will initiate antibiotics. [MB]  6387 Patient was seen by general surgery Dr. Marlou Starks.  He is recommended the patient be admitted to the hospital on the medical service for IV fluids and wound care consult.  He does not think CT abdomen is necessary at this time. [MB]  5643 Discussed with Dr. Bonner Puna from [facility] who will evaluate the patient for admission. [MB]    Clinical Course User Index [MB] Hayden Rasmussen, MD   MDM Rules/Calculators/A&P  This patient complains of abdominal pain, wound drainage; this involves an extensive number of treatment Options and is a complaint that carries with it a high risk of complications and Morbidity. The differential includes abscess, cellulitis, sepsis, thrush, metabolic derangement, failure to thrive,  I ordered, reviewed and interpreted labs, which included CBC with mildly elevated white count, hemoglobin low similar to priors, chemistries fairly unremarkable, LFTs normal, coags normal, lactate elevated and will need to be trended I ordered medication IV fluids, IV antibiotics, IV pain medication, oral Diflucan I ordered imaging studies which included chest x-ray and I independently    visualized and interpreted imaging which showed no acute findings Additional history obtained from patient's family members Previous records obtained and reviewed in  epic including prior operative note and recent wound care visit I consulted Dr. Bonner Puna [facility] hospitalist and Dr. Marlou Starks general surgery and discussed lab and imaging findings  Critical Interventions: Work-up and management of patient's abdominal pain and failure to thrive, sepsis physiology with early intervention of IV antibiotics and fluids  After the interventions stated above, I reevaluated the patient and found patient's pain to still be significant.  She is getting further pain medication.  Will need to be admitted to the hospital for further management   Final Clinical Impression(s) / ED Diagnoses Final diagnoses:  Sepsis, due to unspecified organism, unspecified whether acute organ dysfunction present ([place])  FTT (failure to thrive) in adult  Abdominal wound dehiscence, subsequent encounter  Oral thrush    Rx / DC Orders ED Discharge Orders    None       Hayden Rasmussen, MD 10/24/20 [phone]

## 2020-10-23 NOTE — Consult Note (Signed)
Reason for Consult:wound Referring Physician: Dr. Gust Brooms [name] [name] is an 87 y.o. female.  HPI: The patient is an 87 year old female who is about 3 months status post exploratory laparotomy for small bowel torsion with placement of a gastrostomy tube.  She has unfortunately had significant failure to thrive.  She is also had a moderate amount of drainage from the open abdominal wound and old G-tube site.  This seems to have caused some significant skin breakdown.  She complains of a lot of abdominal pain that corresponds to the skin findings.  She denies any fevers or chills.  She apparently had a CT scan about a week ago that showed no evidence of abscess.  She returns the emergency department today because she is not eating and because she cannot manage the drainage from the wound and because of her pain level.  Past Medical History:  Diagnosis Date  . Allergic rhinitis   . Allergy   . CAD (coronary artery disease)    Mild CAD by cath 2008  . CHF (congestive heart failure) ([place])   . DJD (degenerative joint disease)    rheumatoid  . DVT (deep venous thrombosis) ([place])   . Dyslipidemia   . History of echocardiogram    Echo 12/2018: EF 60-65, mod asymmetric LVH, Gr 1 DD  . History of nuclear stress test    Myoview 5/17:  EF 74%, normal perfusion, low risk // Myoview 12/2018:  EF 89, very mild ischemia in inferoapical wall; Low Risk  . History of pulmonary embolism   . Hyperlipidemia   . Insomnia   . Osteoporosis   . Psoriasis   . Pulmonary fibrosis ([place])   . Rheumatoid arthritis [facility])     Past Surgical History:  Procedure Laterality Date  . ABDOMINAL HYSTERECTOMY  1974  . APPENDECTOMY  1959  . CATARACT EXTRACTION    . LAPAROTOMY N/A 08/23/2020   Procedure: EXPLORATORY LAPAROTOMY small bowel resection gastrostomy tube placement;  Surgeon: Dwan Bolt, MD;  Location: [facility]S;  Service: General;  Laterality: N/A;  . LEFT HEART CATHETERIZATION WITH CORONARY ANGIOGRAM N/A 06/18/2013    Procedure: LEFT HEART CATHETERIZATION WITH CORONARY ANGIOGRAM;  Surgeon: Peter M Martinique, MD;  Location: [facility] CATH LAB;  Service: Cardiovascular;  Laterality: N/A;  . TONSILLECTOMY  1941, 1951  . TOTAL KNEE ARTHROPLASTY  1997, 2007   Bilateral    Family History  Problem Relation Age of Onset  . Kidney disease Daughter 5  . Cancer Daughter   . Heart attack Father 76  . Alzheimer's disease Sister   . Heart disease Sister   . Alzheimer's disease Sister     Social History:  reports that she quit smoking about 47 years ago. Her smoking use included cigarettes. She has a 10.00 pack-year smoking history. She has never used smokeless tobacco. She reports that she does not drink alcohol and does not use drugs.  Allergies:  Allergies  Allergen Reactions  . Crestor [Rosuvastatin] Other (See Comments)    Muscle pain  . Lactose Intolerance (Gi) Other (See Comments)    Stomach upset  . Penicillins Hives  . Imuran [Azathioprine] Nausea And Vomiting    Medications: I have reviewed the patient's current medications.  Results for orders placed or performed during the hospital encounter of 10/23/20 (from the past 48 hour(s))  Comprehensive metabolic panel     Status: Abnormal   Collection Time: 10/23/20  3:58 PM  Result Value Ref Range   Sodium 146 (H) 135 -  145 mmol/L   Potassium 5.3 (H) 3.5 - 5.1 mmol/L   Chloride 110 98 - 111 mmol/L   CO2 26 22 - 32 mmol/L   Glucose, Bld 92 70 - 99 mg/dL    Comment: Glucose reference range applies only to samples taken after fasting for at least 8 hours.   BUN 13 8 - 23 mg/dL   Creatinine, Ser 0.83 0.44 - 1.00 mg/dL   Calcium 9.1 8.9 - 10.3 mg/dL   Total Protein 7.0 6.5 - 8.1 g/dL   Albumin 2.7 (L) 3.5 - 5.0 g/dL   AST 32 15 - 41 U/L   ALT 15 0 - 44 U/L   Alkaline Phosphatase 121 38 - 126 U/L   Total Bilirubin 0.9 0.3 - 1.2 mg/dL   GFR, Estimated >60 >60 mL/min    Comment: (NOTE) Calculated using the CKD-EPI Creatinine Equation (2021)    Anion  gap 10 5 - 15    Comment: Performed at [facility], [place] [address], [place], [place] [address]  CBC WITH DIFFERENTIAL     Status: Abnormal   Collection Time: 10/23/20  3:58 PM  Result Value Ref Range   WBC 11.4 (H) 4.0 - 10.5 K/uL   RBC 3.83 (L) 3.87 - 5.11 MIL/uL   Hemoglobin 11.9 (L) 12.0 - 15.0 g/dL   HCT 38.7 36.0 - 46.0 %   MCV 101.0 (H) 80.0 - 100.0 fL   MCH 31.1 26.0 - 34.0 pg   MCHC 30.7 30.0 - 36.0 g/dL   RDW 20.9 (H) 11.5 - 15.5 %   Platelets 326 150 - 400 K/uL   nRBC 0.0 0.0 - 0.2 %   Neutrophils Relative % 44 %   Neutro Abs 5.1 1.7 - 7.7 K/uL   Lymphocytes Relative 44 %   Lymphs Abs 5.0 (H) 0.7 - 4.0 K/uL   Monocytes Relative 10 %   Monocytes Absolute 1.2 (H) 0.1 - 1.0 K/uL   Eosinophils Relative 0 %   Eosinophils Absolute 0.0 0.0 - 0.5 K/uL   Basophils Relative 1 %   Basophils Absolute 0.1 0.0 - 0.1 K/uL   Immature Granulocytes 1 %   Abs Immature Granulocytes 0.07 0.00 - 0.07 K/uL    Comment: Performed at [facility], [place] [address], [place], [place] [address]  Protime-INR     Status: None   Collection Time: 10/23/20  3:58 PM  Result Value Ref Range   Prothrombin Time 12.6 11.4 - 15.2 seconds   INR 1.0 0.8 - 1.2    Comment: (NOTE) INR goal varies based on device and disease states. Performed at [facility], [place] [address], [place], [place] [address]   APTT     Status: None   Collection Time: 10/23/20  3:58 PM  Result Value Ref Range   aPTT 35 24 - 36 seconds    Comment: Performed at [facility], [place] [address], [place], [place] [address]  Lactic acid, plasma     Status: Abnormal   Collection Time: 10/23/20  4:00 PM  Result Value Ref Range   Lactic Acid, Venous 2.7 (HH) 0.5 - 1.9 mmol/L    Comment: CRITICAL RESULT CALLED TO, READ BACK BY AND VERIFIED WITH: LEONARD,S. RN @1659  ON 04.15.2022 BY COHEN,K Performed at [facility], [place] [address]  [place], [place] [address]     DG Chest Port 1 View  Result Date: 10/23/2020 CLINICAL DATA:  Sepsis, G-tube placed in February 2022, nonhealing  wound EXAM: PORTABLE CHEST 1 VIEW COMPARISON:  Portable exam 1609 hours compared to 08/24/2020 FINDINGS: Normal heart size, mediastinal contours, and pulmonary vascularity. Atherosclerotic calcification aorta. Severe pulmonary fibrosis with peripheral and basilar predominance unchanged since an earlier study of 02/12/2020. Underlying emphysematous and bronchitic changes. No definite acute infiltrate, pleural effusion, or pneumothorax. Dextroconvex thoracic scoliosis. IMPRESSION: Changes of COPD and severe pulmonary fibrosis. No acute abnormalities. Aortic Atherosclerosis (ICD10-I70.0) and Emphysema (ICD10-J43.9). Electronically Signed   By: Lavonia Dana M.D.   On: 10/23/2020 16:32    Review of Systems  Constitutional: Positive for appetite change, fatigue and unexpected weight change.  HENT: Negative.   Eyes: Negative.   Respiratory: Negative.   Cardiovascular: Negative.   Gastrointestinal: Positive for abdominal pain.  Endocrine: Negative.   Genitourinary: Negative.   Musculoskeletal: Negative.   Skin: Positive for rash.  Allergic/Immunologic: Negative.   Neurological: Negative.   Hematological: Negative.   Psychiatric/Behavioral: Negative.    Blood pressure 135/81, pulse 82, temperature 97.7 F (36.5 C), temperature source Oral, resp. rate 14, SpO2 98 %. Physical Exam Constitutional:      Comments: Frail and cachectic  HENT:     Head: Normocephalic and atraumatic.     Right Ear: External ear normal.     Left Ear: External ear normal.     Nose: Nose normal.     Mouth/Throat:     Mouth: Mucous membranes are dry.     Pharynx: Oropharynx is clear.  Eyes:     General: No scleral icterus.    Extraocular Movements: Extraocular movements intact.     Conjunctiva/sclera: Conjunctivae normal.     Pupils: Pupils are equal, round, and reactive to  light.  Cardiovascular:     Rate and Rhythm: Normal rate and regular rhythm.     Pulses: Normal pulses.     Heart sounds: Normal heart sounds.     Comments: No pitting edema lower extr Pulmonary:     Effort: Pulmonary effort is normal. No respiratory distress.     Breath sounds: Normal breath sounds. No stridor.  Abdominal:     General: Abdomen is flat.     Tenderness: There is abdominal tenderness.     Comments: Open wounds with mild drainage but otherwise clean. There is significant skin breakdown around wounds and gtube site although gtube has been removed  Musculoskeletal:        General: No swelling or deformity. Normal range of motion.     Cervical back: Normal range of motion and neck supple. No tenderness.  Skin:    General: Skin is warm and dry.     Findings: Rash present.  Neurological:     General: No focal deficit present.     Mental Status: She is alert and oriented to person, place, and time.  Psychiatric:        Mood and Affect: Mood normal.        Behavior: Behavior normal.        Thought Content: Thought content normal.     Assessment/Plan: The patient does have open midline wounds that seem to be self-limited with some drainage.  It is not clear how much drainage is coming from the old G-tube site.  I do think that this fluid has caused skin maceration and breakdown.  She appears to have also have some element of failure to thrive and is not eating.  She would likely benefit from a medical admission.  She will require a wound care consult.  She may  benefit also from TPN if she cannot take enough orally.  I would start with wet-to-dry dressing changes to the open midline wounds.  We will follow her closely with you.  Autumn Messing III 10/23/2020, 5:38 PM

## 2020-10-23 NOTE — ED Notes (Signed)
Placed pt on purewick  

## 2020-10-24 ENCOUNTER — Other Ambulatory Visit: Payer: Self-pay

## 2020-10-24 ENCOUNTER — Inpatient Hospital Stay (HOSPITAL_COMMUNITY): Payer: Medicare Other

## 2020-10-24 DIAGNOSIS — L03311 Cellulitis of abdominal wall: Secondary | ICD-10-CM

## 2020-10-24 DIAGNOSIS — I2510 Atherosclerotic heart disease of native coronary artery without angina pectoris: Secondary | ICD-10-CM | POA: Diagnosis not present

## 2020-10-24 DIAGNOSIS — I5032 Chronic diastolic (congestive) heart failure: Secondary | ICD-10-CM | POA: Diagnosis not present

## 2020-10-24 DIAGNOSIS — Z7189 Other specified counseling: Secondary | ICD-10-CM

## 2020-10-24 DIAGNOSIS — T8130XD Disruption of wound, unspecified, subsequent encounter: Secondary | ICD-10-CM | POA: Diagnosis not present

## 2020-10-24 DIAGNOSIS — R627 Adult failure to thrive: Secondary | ICD-10-CM

## 2020-10-24 DIAGNOSIS — A419 Sepsis, unspecified organism: Secondary | ICD-10-CM | POA: Diagnosis not present

## 2020-10-24 LAB — CBC
HCT: 38.6 % (ref 36.0–46.0)
Hemoglobin: 12 g/dL (ref 12.0–15.0)
MCH: 30.9 pg (ref 26.0–34.0)
MCHC: 31.1 g/dL (ref 30.0–36.0)
MCV: 99.5 fL (ref 80.0–100.0)
Platelets: 227 10*3/uL (ref 150–400)
RBC: 3.88 MIL/uL (ref 3.87–5.11)
RDW: 20.9 % — ABNORMAL HIGH (ref 11.5–15.5)
WBC: 9.5 10*3/uL (ref 4.0–10.5)
nRBC: 0 % (ref 0.0–0.2)

## 2020-10-24 LAB — COMPREHENSIVE METABOLIC PANEL
ALT: 11 U/L (ref 0–44)
AST: 17 U/L (ref 15–41)
Albumin: 2.1 g/dL — ABNORMAL LOW (ref 3.5–5.0)
Alkaline Phosphatase: 106 U/L (ref 38–126)
Anion gap: 8 (ref 5–15)
BUN: 9 mg/dL (ref 8–23)
CO2: 25 mmol/L (ref 22–32)
Calcium: 8.3 mg/dL — ABNORMAL LOW (ref 8.9–10.3)
Chloride: 106 mmol/L (ref 98–111)
Creatinine, Ser: 0.6 mg/dL (ref 0.44–1.00)
GFR, Estimated: >60 mL/min (ref >60)
Glucose, Bld: 69 mg/dL — ABNORMAL LOW (ref 70–99)
Potassium: 3.9 mmol/L (ref 3.5–5.1)
Sodium: 139 mmol/L (ref 135–145)
Total Bilirubin: 0.6 mg/dL (ref 0.3–1.2)
Total Protein: 5.5 g/dL — ABNORMAL LOW (ref 6.5–8.1)

## 2020-10-24 LAB — MAGNESIUM: Magnesium: 1.5 mg/dL — ABNORMAL LOW (ref 1.7–2.4)

## 2020-10-24 LAB — PHOSPHORUS: Phosphorus: 2.5 mg/dL (ref 2.5–4.6)

## 2020-10-24 MED ORDER — PROSOURCE PLUS PO LIQD
30.0000 mL | Freq: Three times a day (TID) | ORAL | Status: DC
  Administered 2020-10-25 – 2020-10-30 (×12): 30 mL via ORAL
  Filled 2020-10-24 (×12): qty 30

## 2020-10-24 MED ORDER — MAGIC MOUTHWASH W/LIDOCAINE
5.0000 mL | Freq: Three times a day (TID) | ORAL | Status: DC
  Administered 2020-10-24 – 2020-11-04 (×24): 5 mL via ORAL
  Filled 2020-10-24 (×34): qty 5

## 2020-10-24 MED ORDER — BOOST / RESOURCE BREEZE PO LIQD CUSTOM
1.0000 | Freq: Three times a day (TID) | ORAL | Status: DC
  Administered 2020-10-25 – 2020-11-04 (×13): 1 via ORAL

## 2020-10-24 MED ORDER — MAGNESIUM SULFATE 2 GM/50ML IV SOLN
2.0000 g | Freq: Once | INTRAVENOUS | Status: AC
  Administered 2020-10-24: 2 g via INTRAVENOUS
  Filled 2020-10-24: qty 50

## 2020-10-24 MED ORDER — ADULT MULTIVITAMIN W/MINERALS CH
1.0000 | ORAL_TABLET | Freq: Every day | ORAL | Status: DC
  Administered 2020-10-24 – 2020-11-04 (×9): 1 via ORAL
  Filled 2020-10-24 (×11): qty 1

## 2020-10-24 MED ORDER — BENZOCAINE 10 % MT GEL
Freq: Four times a day (QID) | OROMUCOSAL | Status: DC | PRN
  Administered 2020-10-28: 1 via OROMUCOSAL
  Filled 2020-10-24: qty 9.4

## 2020-10-24 MED ORDER — LACTATED RINGERS IV SOLN: INTRAVENOUS | Status: AC

## 2020-10-24 NOTE — Progress Notes (Signed)
Abdominal wall cellulitis  Subjective: No acute events overnight  Objective: Vital signs in last 24 hours: Temp:  [97.6 F (36.4 C)-98.1 F (36.7 C)] 97.7 F (36.5 C) (04/16 0457) Pulse Rate:  [77-105] 79 (04/16 0457) Resp:  [14-24] 17 (04/16 0457) BP: (99-135)/(64-81) 115/79 (04/16 0457) SpO2:  [96 %-100 %] 96 % (04/16 0457) Weight:  [40.3 kg-42.6 kg] 42.6 kg (04/16 0458)    Intake/Output from previous day: 04/15 0701 - 04/16 0700 In: 1205 [I.V.:955; IV Piggyback:250] Out: -  Intake/Output this shift: No intake/output data recorded.  Incision/Wound: significant excoriation from G tube drainage Open midline with granulation Lab Results:  Results for orders placed or performed during the hospital encounter of 10/23/20 (from the past 24 hour(s))  Comprehensive metabolic panel     Status: Abnormal   Collection Time: 10/23/20  3:58 PM  Result Value Ref Range   Sodium 146 (H) 135 - 145 mmol/L   Potassium 5.3 (H) 3.5 - 5.1 mmol/L   Chloride 110 98 - 111 mmol/L   CO2 26 22 - 32 mmol/L   Glucose, Bld 92 70 - 99 mg/dL   BUN 13 8 - 23 mg/dL   Creatinine, Ser 0.83 0.44 - 1.00 mg/dL   Calcium 9.1 8.9 - 10.3 mg/dL   Total Protein 7.0 6.5 - 8.1 g/dL   Albumin 2.7 (L) 3.5 - 5.0 g/dL   AST 32 15 - 41 U/L   ALT 15 0 - 44 U/L   Alkaline Phosphatase 121 38 - 126 U/L   Total Bilirubin 0.9 0.3 - 1.2 mg/dL   GFR, Estimated >60 >60 mL/min   Anion gap 10 5 - 15  CBC WITH DIFFERENTIAL     Status: Abnormal   Collection Time: 10/23/20  3:58 PM  Result Value Ref Range   WBC 11.4 (H) 4.0 - 10.5 K/uL   RBC 3.83 (L) 3.87 - 5.11 MIL/uL   Hemoglobin 11.9 (L) 12.0 - 15.0 g/dL   HCT 38.7 36.0 - 46.0 %   MCV 101.0 (H) 80.0 - 100.0 fL   MCH 31.1 26.0 - 34.0 pg   MCHC 30.7 30.0 - 36.0 g/dL   RDW 20.9 (H) 11.5 - 15.5 %   Platelets 326 150 - 400 K/uL   nRBC 0.0 0.0 - 0.2 %   Neutrophils Relative % 44 %   Neutro Abs 5.1 1.7 - 7.7 K/uL   Lymphocytes Relative 44 %   Lymphs Abs 5.0 (H) 0.7 - 4.0  K/uL   Monocytes Relative 10 %   Monocytes Absolute 1.2 (H) 0.1 - 1.0 K/uL   Eosinophils Relative 0 %   Eosinophils Absolute 0.0 0.0 - 0.5 K/uL   Basophils Relative 1 %   Basophils Absolute 0.1 0.0 - 0.1 K/uL   Immature Granulocytes 1 %   Abs Immature Granulocytes 0.07 0.00 - 0.07 K/uL  Protime-INR     Status: None   Collection Time: 10/23/20  3:58 PM  Result Value Ref Range   Prothrombin Time 12.6 11.4 - 15.2 seconds   INR 1.0 0.8 - 1.2  APTT     Status: None   Collection Time: 10/23/20  3:58 PM  Result Value Ref Range   aPTT 35 24 - 36 seconds  Lactic acid, plasma     Status: Abnormal   Collection Time: 10/23/20  4:00 PM  Result Value Ref Range   Lactic Acid, Venous 2.7 (HH) 0.5 - 1.9 mmol/L  Resp Panel by RT-PCR (Flu A&B, Covid) Nasopharyngeal Swab  Status: None   Collection Time: 10/23/20  5:34 PM   Specimen: Nasopharyngeal Swab; Nasopharyngeal(NP) swabs in vial transport medium  Result Value Ref Range   SARS Coronavirus 2 by RT PCR NEGATIVE NEGATIVE   Influenza A by PCR NEGATIVE NEGATIVE   Influenza B by PCR NEGATIVE NEGATIVE  Lactic acid, plasma     Status: Abnormal   Collection Time: 10/23/20  7:58 PM  Result Value Ref Range   Lactic Acid, Venous 3.4 (HH) 0.5 - 1.9 mmol/L  Vitamin B12     Status: Abnormal   Collection Time: 10/23/20  7:58 PM  Result Value Ref Range   Vitamin B-12 1,231 (H) 180 - 914 pg/mL  Prealbumin     Status: Abnormal   Collection Time: 10/23/20  7:58 PM  Result Value Ref Range   Prealbumin 8.0 (L) 18 - 38 mg/dL  Comprehensive metabolic panel     Status: Abnormal   Collection Time: 10/24/20  5:19 AM  Result Value Ref Range   Sodium 139 135 - 145 mmol/L   Potassium 3.9 3.5 - 5.1 mmol/L   Chloride 106 98 - 111 mmol/L   CO2 25 22 - 32 mmol/L   Glucose, Bld 69 (L) 70 - 99 mg/dL   BUN 9 8 - 23 mg/dL   Creatinine, Ser 0.60 0.44 - 1.00 mg/dL   Calcium 8.3 (L) 8.9 - 10.3 mg/dL   Total Protein 5.5 (L) 6.5 - 8.1 g/dL   Albumin 2.1 (L) 3.5 - 5.0  g/dL   AST 17 15 - 41 U/L   ALT 11 0 - 44 U/L   Alkaline Phosphatase 106 38 - 126 U/L   Total Bilirubin 0.6 0.3 - 1.2 mg/dL   GFR, Estimated >60 >60 mL/min   Anion gap 8 5 - 15  Magnesium     Status: Abnormal   Collection Time: 10/24/20  5:19 AM  Result Value Ref Range   Magnesium 1.5 (L) 1.7 - 2.4 mg/dL  Phosphorus     Status: None   Collection Time: 10/24/20  5:19 AM  Result Value Ref Range   Phosphorus 2.5 2.5 - 4.6 mg/dL  CBC     Status: Abnormal   Collection Time: 10/24/20  5:19 AM  Result Value Ref Range   WBC 9.5 4.0 - 10.5 K/uL   RBC 3.88 3.87 - 5.11 MIL/uL   Hemoglobin 12.0 12.0 - 15.0 g/dL   HCT 38.6 36.0 - 46.0 %   MCV 99.5 80.0 - 100.0 fL   MCH 30.9 26.0 - 34.0 pg   MCHC 31.1 30.0 - 36.0 g/dL   RDW 20.9 (H) 11.5 - 15.5 %   Platelets 227 150 - 400 K/uL   nRBC 0.0 0.0 - 0.2 %     Studies/Results Radiology     MEDS, Scheduled . (feeding supplement) PROSource Plus  30 mL Oral BID BM  . enoxaparin (LOVENOX) injection  30 mg Subcutaneous Q24H  . lipase/protease/amylase  36,000 Units Oral TID AC  . mirtazapine  15 mg Oral QHS  . nystatin   Topical BID  . predniSONE  5 mg Oral Q breakfast     Assessment: Abdominal wall cellulitis Malnutrition Failure to thrive  Plan: Needs WOC evaluation and management of wounds Keep drainage off the skin surface to protect from further damage   LOS: 1 day    Rosario Adie, [facility], [place]    10/24/2020 7:38 AM

## 2020-10-24 NOTE — Consult Note (Signed)
Palliative Medicine Inpatient Consult Note  Reason for consult:  Nonhealing wound, elderly failure to thrive with severe malnutrition, continuation of [place] discussions and code discussion.  HPI:  Per Hospitalist Dr. Bonner Puna note 4/16 --> "[name] [name] MP55 y.o.femalewith a history ofUIP on 2L O2, chronic prednisone, RA, history of DVT/PE, mild CAD, and SBO s/p ex lap February 2022 with G tube placement since removed who returns to the ED 4/15 with worsening abdominal wound drainage and pain. She was admitted for a few weeks in February - March 6144 with complications after exploratory laparotomy requiring TPN and G tube placement which was ultimately removed, the patient discharge to SNF for less than 2 weeks, then returned home with 24 hours care. With close surgery follow up she was referred to the wound care center and has been having a wound RN from [facility] performing BID wound dressing changes, though she has continued to struggle for weeks with poor appetite, now odynophagia, symptoms of fungal infection, and over the past few days worsening generalized weakness. A superficial culture taken of the wound showed polymicrobial infection with plans to treat with bactrim though the family stated she was unlikely to be able to swallow the pills as it would upset her stomach.  ED Course:Tachycardic to 104, tachypneic to 21/min, though afebrile, with leukocytosis and elevation of lactic acid to 2.7. CXR showed no acute findings and respiratory status has been stable on home oxygen. Labs appear hemoconcentrated from priors with hyperkalemia (5.3) and hypernatremia (146). Blood cultures were drawn, sepsis diagnosed and IV antibiotics given as well as 30cc/kg IVF. General surgery has evaluated the patient and medicine called for admission."  CT of head and pelvis today showing no acute processes.  Clinical Assessment/Goals of Care: I have reviewed medical records including EPIC notes, labs and  imaging, received report from bedside RN Melissa, and assessed the patient.    I met with [name] [name]  to further discuss diagnosis, prognosis, GOC, EOL wishes, disposition and options. Afterward I had a phone conversation with nephew [name]) Caprice Beaver. He then came in to the hospital and we met in the patient's room with patient's sister-in-law [name] [name] on speaker phone.    I introduced Palliative Medicine as specialized medical care for people living with serious illness. It focuses on providing relief from the symptoms and stress of a serious illness. The goal is to improve quality of life for both the patient and the family.  [name] [name] has no children. Her sister in law [name] [name] states she is the Medical POA. [name] [name]'s cell is [phone]. I asked [name] [name] to bring in the MPOA document.  She and nephew [name] [name] and his wife who is a Editor, commissioning  are actively involved in [name] [name]'s care. They have been coordinating 24 hour care at home for her. She states approximately 80% of the care is hired and 20% by family. Wound care has been contracted  by [facility]. Home health PT and OT were also provided. They had an outpatient Palliative medicine referral with appointment for initiial intake set up for next Wednesday.   A detailed discussion was had today regarding advanced directives.  Concepts specific to code status, artifical feeding and hydration, continued IV antibiotics and rehospitalization was had.  The difference between an aggressive medical intervention path and a palliative comfort care path for this patient at this time was had. Values and goals of care important to patient and family were attempted to be elicited.   When alone, Ms.  [name] initial said she would like to be allowed a natural death versus resuscitation. Once her nephew [name] [name] was present, he asked her  about the conversation yesterday with Dr. Bonner Puna about Code blue response and she agreed with her response yesterday for full code  status. She would like aggressive treatment for the wound infection.  [name] [name] shared that she did well with inpatient rehab and transitioned to home but had challenges with mouth pain and pain from the wound at the feeding tube site, which impacted her ability to eat. She does have an ulceration in her mouth affected by dental implant screws which has impacted her ability to speak clearly today. The family brought in her dentures but they cannot be placed until the ulceration heals.  Dental wax on the dental screws may help protect her gums. The family is versed on her medical problems and current condition. They want to give her every opportunity to improve.   Current symptoms to mange are chronic pain, oral pain and physical debility.   Family is awaiting CT scan results and [name] [name] plans to be present Monday to meet with the doctors.  [name] [name] and her family are open to inpatient rehab again as a transition to home. If she requires IV antibiotics this may be the best option, although the family has a good set up at home for care depending upon PT and OT needs.     Discussed the importance of continued conversation with family and their  medical providers regarding overall plan of care and treatment options, ensuring decisions are within the context of the patient's values and GOCs.    Decision Maker:Patient, MPOA verbalized as [name] [name] if patient cannot make decisions.   SUMMARY OF RECOMMENDATIONS    Code Status/Advance Care Planning: FULL CODE  Symptom Management:   Mouth ulceration- oragel ordered, magic mouthwash with lidocaine,  family to bring in dental wax for implant screws.  Deconditioning- PT and OT evaluation and treatment, optimize nutrition, feeding supplements, remeron   Chronic pain abdominal - acetaminophen prn, hydromorphone prn   Additional Recommendations (Limitations, Scope, Preferences):  Palliative medicine to follow for continued goals of care conversations and  symptom management  Plan for outpatient palliatitve medicine after discharge.    Psycho-social/Spiritual:   Desire for further Chaplaincy support: Yes, consult ordered, protestant faith  Additional Recommendations: Continue current care, palliative to follow for continued Gas discussions with family   Prognosis: poor in older adult with severe malnutrition, chronic steroid use and nonhealing wound.  Discharge Planning: Once medically cleared, inpatient rehab versus home with home health with consideration of IV antibiotic needs. OT is recommending SNF. Outpatient palliative care.  Vitals with BMI 10/24/2020 10/24/2020 10/24/2020  Height - - -  Weight - - 93 lbs 15 oz  BMI - - 45.03  Systolic 88 888 -  Diastolic 55 99 -  Pulse 76 98 -    PPS: 40%   This conversation/these recommendations were discussed with patient primary care team, Dr. Bonner Puna via secure chat.   Thank you for the opportunity to participate in the care of this patient and family.   Time 4:00-4:40 5:20-6:10   Total Time: >70  minutes Greater than 50%  of this time was spent counseling and coordinating care related to the above assessment and plan.  Lindell Spar, NP [facility] Palliative Medicine Team Team Cell Phone: [phone] Please utilize secure chat with additional questions, if there is no response within 30 minutes please call the above phone  number  Palliative Medicine Team providers are available by phone from 7am to 7pm daily and can be reached through the team cell phone.  Should this patient require assistance outside of these hours, please call the patient's attending physician.

## 2020-10-24 NOTE — Progress Notes (Signed)
PROGRESS NOTE  [name] [name]  BPZ:[id number] DOB: 01-Jul-1931 DOA: 10/23/2020 PCP: Hoyt Koch, MD  Outpatient Specialists: Dr. Zenia Resides (surgery)  Brief Narrative: [name] [name] is an 87 y.o. 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 She was admitted for a few weeks in February - March 2423 with complications after exploratory laparotomy requiring TPN and G tube placement which was ultimately removed, the patient discharge to SNF for less than 2 weeks, then returned home with 24 hours care. With close surgery follow up she was referred to the wound care center and has been having a wound RN from [facility] performing BID wound dressing changes, though she has continued to struggle for weeks with poor appetite, now odynophagia, symptoms of fungal infection, and over the past few days worsening generalized weakness. A superficial culture taken of the wound showed polymicrobial infection with plans to treat with bactrim though the family stated she was unlikely to be able to swallow the pills as it would upset her stomach.   ED Course: Tachycardic to 104, tachypneic to 21/min, though afebrile, with leukocytosis and elevation of lactic acid to 2.7. CXR showed no acute findings and respiratory status has been stable on home oxygen. Labs appear hemoconcentrated from priors with hyperkalemia (5.3) and hypernatremia (146). Blood cultures were drawn, sepsis diagnosed and IV antibiotics given as well as 30cc/kg IVF. General surgery has evaluated the patient and medicine called for admission.    Assessment & Plan: Principal Problem:   Abdominal wall cellulitis Active Problems:   CAD (coronary artery disease)   RA (rheumatoid arthritis) (HCC)   Chronic respiratory failure with hypoxia (HCC)   Chronic diastolic  CHF (congestive heart failure) (HCC)   Iron deficiency anemia   Personal history of PE (pulmonary embolism)   Unintentional weight loss   ILD (interstitial lung disease) (HCC)   Protein-calorie malnutrition, severe   Severe sepsis (HCC)  Severe sepsis: The patient was tachypneic, tachycardic with leukocytosis and increasing drainage from abdominal wound suggestive of cellulitis. Lactic acid 2.7 indicating end organ dysfunction. - Continue IV antibiotics. Based on culture data and hoping to minimize nephrotoxins (e.g. zosyn, vancomycin), will continue IV bactrim.  - Monitor culture data. Given severe sepsis at presentation, will await another 24 hours of negative blood cultures prior to initiating PICC/TPN.  Thrush:  - IV diflucan while unable to tolerate po reliably.  - Magic mouthwash.  Nonhealing abdominal wounds, MASD:  - Unable to stop prednisone. Hold other UIP medications. - Optimize nutrition. - Wound care consulted. For now surgery recommends BID W > D.  - Keep area as dry as possible, nystatin powder. - Pt's family reports output from G tube wound with PO intake. There was no evidence of communication/gastrocutaneous fistula on recent CT, though this report began after that CT. Will repeat CT.  Dehydration, hypernatremia, severe protein-calorie malnutrition, failure to thrive, generalized weakness: Appears emaciated. Ketonuria without glucosuria.  - Dietitian consulted. Prealbumin is 8 - SLP evaluation > mild aspiration risk, unable to chew so dysphagia 1 diet, thin liquids recommended. No dysphagia noted.  - Remeron for appetite stimulation. - Continue IVF. Last EF preserved, indeterminate diastolic function, and no evidence of volume overload.  - Will continue to discuss TPN initiation, possibly insert PICC and initiate TPN if blood cultures remain  negative and pending [place] discussions. - PT, OT, TOC consulted  Hyperkalemic AKI: Improving.  - Lokelma - IVF - Will need  continued monitoring while on bactrim.  Hypomagnesemia:  - Supplement and monitor.   Chronic hypoxic respiratory failure due to UIP: No infiltrate on CXR. At baseline.  - Continue 2L O2.  Goals of care: These were introduced at admission with my fear that the combination of malnutrition, declining functional status, advanced age, and chronic immunosuppression suggest these wounds may never heal. The patient and family are very clear in their wishes at this time, however. They would like any and all available aggressive interventions with curative intent. This includes prolonged TPN, intensive wound care and physical therapy, IV antibiotics, and hospital monitoring.  - Full code confirmed. - I introduced palliative care and consulted them. They had an appointment with [facility] as outpatient pending.   DVT prophylaxis: Lovenox Code Status: Full Family Communication: None at bedside, will reach out to family today.  Disposition Plan:  Status is: Inpatient  Remains inpatient appropriate because:IV treatments appropriate due to intensity of illness or inability to take PO   Dispo: The patient is from: Home              Anticipated d/c is to: TBD              Patient currently is not medically stable to d/c.   Difficult to place patient No  Consultants:   Surgery  Palliative care  Procedures:   None  Antimicrobials:  Ceftriaxone 4/15  Fluconazole 4/15 >>  Bactrim 4/15 >>  Subjective: Pt taking prosource this AM, denies any major changes, though mouth pain persists and abdominal pain is controlled except when anyone is touching it.   Objective: Vitals:   10/24/20 0004 10/24/20 0457 10/24/20 0458 10/24/20 0811  BP: 108/64 115/79  (!) 131/99  Pulse: 77 79  98  Resp: 18 17  17   Temp: 98.1 F (36.7 C) 97.7 F (36.5 C)  98 F (36.7 C)  TempSrc:  Oral  Oral  SpO2: 100% 96%  99%  Weight:   42.6 kg   Height: 5' 4.5" (1.638 m)       Intake/Output Summary (Last 24  hours) at 10/24/2020 1225 Last data filed at 10/24/2020 1610 Gross per 24 hour  Intake 1205 ml  Output --  Net 1205 ml   Filed Weights   10/23/20 2013 10/24/20 0458  Weight: 40.3 kg 42.6 kg   Gen: Elderly frail female in no distress Pulm: Non-labored breathing 2L. Clear to auscultation bilaterally.  CV: Regular rate and rhythm. No murmur, rub, or gallop. No JVD, no pedal edema. GI: Abdomen soft, stable tenderness, non-distended, with normoactive bowel sounds.   Ext: Warm, no deformities Skin: Abdominal wounds appear grossly stable from yesterday.  Neuro: Alert and oriented. No focal neurological deficits. Psych: Judgement and insight appear normal. Mood & affect appropriate.   Data Reviewed: I have personally reviewed following labs and imaging studies  CBC: Recent Labs  Lab 10/23/20 1558 10/24/20 0519  WBC 11.4* 9.5  NEUTROABS 5.1  --   HGB 11.9* 12.0  HCT 38.7 38.6  MCV 101.0* 99.5  PLT 326 960   Basic Metabolic Panel: Recent Labs  Lab 10/23/20 1558 10/24/20 0519  NA 146* 139  K 5.3* 3.9  CL 110 106  CO2 26 25  GLUCOSE 92 69*  BUN 13 9  CREATININE 0.83 0.60  CALCIUM 9.1 8.3*  MG  --  1.5*  PHOS  --  2.5   GFR: Estimated Creatinine Clearance: 32.7 mL/min (by C-G formula based on SCr of 0.6 mg/dL). Liver Function Tests: Recent Labs  Lab 10/23/20 1558 10/24/20 0519  AST 32 17  ALT 15 11  ALKPHOS 121 106  BILITOT 0.9 0.6  PROT 7.0 5.5*  ALBUMIN 2.7* 2.1*   No results for input(s): LIPASE, AMYLASE in the last 168 hours. No results for input(s): AMMONIA in the last 168 hours. Coagulation Profile: Recent Labs  Lab 10/23/20 1558  INR 1.0   Cardiac Enzymes: No results for input(s): CKTOTAL, CKMB, CKMBINDEX, TROPONINI in the last 168 hours. BNP (last 3 results) Recent Labs    06/03/20 1001 09/29/20 1425  PROBNP 100 58.0   HbA1C: No results for input(s): HGBA1C in the last 72 hours. CBG: No results for input(s): GLUCAP in the last 168  hours. Lipid Profile: No results for input(s): CHOL, HDL, LDLCALC, TRIG, CHOLHDL, LDLDIRECT in the last 72 hours. Thyroid Function Tests: No results for input(s): TSH, T4TOTAL, FREET4, T3FREE, THYROIDAB in the last 72 hours. Anemia Panel: Recent Labs    10/23/20 1958  VITAMINB12 1,231*   Urine analysis:    Component Value Date/Time   COLORURINE YELLOW 10/16/2020 2145   APPEARANCEUR CLEAR 10/16/2020 2145   LABSPEC >1.046 (H) 10/16/2020 2145   PHURINE 5.0 10/16/2020 2145   GLUCOSEU NEGATIVE 10/16/2020 2145   GLUCOSEU NEGATIVE 01/03/2020 1434   HGBUR SMALL (A) 10/16/2020 2145   BILIRUBINUR NEGATIVE 10/16/2020 2145   BILIRUBINUR N 01/24/2014 1354   KETONESUR 5 (A) 10/16/2020 2145   PROTEINUR NEGATIVE 10/16/2020 2145   UROBILINOGEN 4.0 (A) 01/03/2020 1434   NITRITE NEGATIVE 10/16/2020 2145   LEUKOCYTESUR NEGATIVE 10/16/2020 2145   Recent Results (from the past 240 hour(s))  Aerobic/Anaerobic Culture w Gram Stain (surgical/deep wound)     Status: None   Collection Time: 10/16/20  9:34 AM   Specimen: Abdomen  Result Value Ref Range Status   Specimen Description   Final    ABDOMEN LEFT G TUBE SITE Performed at [facility] [address] [place], [place] [address]    Special Requests   Final    NONE Performed at [facility], [place] [address], [place], [place] [address]    Gram Stain   Final    RARE WBC PRESENT, PREDOMINANTLY PMN RARE GRAM POSITIVE COCCI RARE GRAM VARIABLE ROD    Culture   Final    RARE STAPHYLOCOCCUS AUREUS FEW ENTEROBACTER CLOACAE FEW MORGANELLA MORGANII NO ANAEROBES ISOLATED Performed at [facility], [place] [address] [place],  [address]    Report Status 10/22/2020 FINAL  Final   Organism ID, Bacteria STAPHYLOCOCCUS AUREUS  Final   Organism ID, Bacteria ENTEROBACTER CLOACAE  Final   Organism ID, Bacteria MORGANELLA MORGANII  Final      Susceptibility   Enterobacter cloacae - MIC*    CEFAZOLIN  >=64 RESISTANT Resistant     CEFEPIME <=0.12 SENSITIVE Sensitive     CEFTAZIDIME <=1 SENSITIVE Sensitive     CIPROFLOXACIN <=0.25 SENSITIVE Sensitive     GENTAMICIN <=1 SENSITIVE Sensitive     IMIPENEM <=0.25 SENSITIVE Sensitive     TRIMETH/SULFA <=20 SENSITIVE Sensitive     PIP/TAZO 8 SENSITIVE Sensitive     * FEW ENTEROBACTER CLOACAE   Morganella morganii - MIC*    AMPICILLIN >=32 RESISTANT Resistant     CEFAZOLIN >=64 RESISTANT Resistant     CEFTAZIDIME >=64 RESISTANT Resistant  CIPROFLOXACIN <=0.25 SENSITIVE Sensitive     GENTAMICIN <=1 SENSITIVE Sensitive     IMIPENEM <=0.25 SENSITIVE Sensitive     TRIMETH/SULFA <=20 SENSITIVE Sensitive     AMPICILLIN/SULBACTAM >=32 RESISTANT Resistant     PIP/TAZO <=4 SENSITIVE Sensitive     * FEW MORGANELLA MORGANII   Staphylococcus aureus - MIC*    CIPROFLOXACIN >=8 RESISTANT Resistant     ERYTHROMYCIN <=0.25 SENSITIVE Sensitive     GENTAMICIN <=0.5 SENSITIVE Sensitive     OXACILLIN >=4 RESISTANT Resistant     TETRACYCLINE <=1 SENSITIVE Sensitive     VANCOMYCIN <=0.5 SENSITIVE Sensitive     TRIMETH/SULFA <=10 SENSITIVE Sensitive     CLINDAMYCIN <=0.25 SENSITIVE Sensitive     RIFAMPIN <=0.5 SENSITIVE Sensitive     Inducible Clindamycin NEGATIVE Sensitive     * RARE STAPHYLOCOCCUS AUREUS  Resp Panel by RT-PCR (Flu A&B, Covid) Nasopharyngeal Swab     Status: None   Collection Time: 10/23/20  5:34 PM   Specimen: Nasopharyngeal Swab; Nasopharyngeal(NP) swabs in vial transport medium  Result Value Ref Range Status   SARS Coronavirus 2 by RT PCR NEGATIVE NEGATIVE Final    Comment: (NOTE) SARS-CoV-2 target nucleic acids are NOT DETECTED.  The SARS-CoV-2 RNA is generally detectable in upper respiratory specimens during the acute phase of infection. The lowest concentration of SARS-CoV-2 viral copies this assay can detect is 138 copies/mL. A negative result does not preclude SARS-Cov-2 infection and should not be used as the sole basis  for treatment or other patient management decisions. A negative result may occur with  improper specimen collection/handling, submission of specimen other than nasopharyngeal swab, presence of viral mutation(s) within the areas targeted by this assay, and inadequate number of viral copies(<138 copies/mL). A negative result must be combined with clinical observations, patient history, and epidemiological information. The expected result is Negative.  Fact Sheet for Patients:  EntrepreneurPulse.com.au  Fact Sheet for Healthcare Providers:  IncredibleEmployment.be  This test is no t yet approved or cleared by the [place] FDA and  has been authorized for detection and/or diagnosis of SARS-CoV-2 by FDA under an Emergency Use Authorization (EUA). This EUA will remain  in effect (meaning this test can be used) for the duration of the COVID-19 declaration under Section 564(b)(1) of the Act, 21 U.S.C.section 360bbb-3(b)(1), unless the authorization is terminated  or revoked sooner.       Influenza A by PCR NEGATIVE NEGATIVE Final   Influenza B by PCR NEGATIVE NEGATIVE Final    Comment: (NOTE) The Xpert Xpress SARS-CoV-2/FLU/RSV plus assay is intended as an aid in the diagnosis of influenza from Nasopharyngeal swab specimens and should not be used as a sole basis for treatment. Nasal washings and aspirates are unacceptable for Xpert Xpress SARS-CoV-2/FLU/RSV testing.  Fact Sheet for Patients: EntrepreneurPulse.com.au  Fact Sheet for Healthcare Providers: IncredibleEmployment.be  This test is not yet approved or cleared by the [place] FDA and has been authorized for detection and/or diagnosis of SARS-CoV-2 by FDA under an Emergency Use Authorization (EUA). This EUA will remain in effect (meaning this test can be used) for the duration of the COVID-19 declaration under Section 564(b)(1) of the Act, 21  U.S.C. section 360bbb-3(b)(1), unless the authorization is terminated or revoked.  Performed at [facility], [place] [address] [place], [place] [address]       Radiology Studies: [facility] Chest Port 1 View  Result Date: 10/23/2020 CLINICAL DATA:  Sepsis, G-tube placed in February 2022, nonhealing wound EXAM:  PORTABLE CHEST 1 VIEW COMPARISON:  Portable exam 1609 hours compared to 08/24/2020 FINDINGS: Normal heart size, mediastinal contours, and pulmonary vascularity. Atherosclerotic calcification aorta. Severe pulmonary fibrosis with peripheral and basilar predominance unchanged since an earlier study of 02/12/2020. Underlying emphysematous and bronchitic changes. No definite acute infiltrate, pleural effusion, or pneumothorax. Dextroconvex thoracic scoliosis. IMPRESSION: Changes of COPD and severe pulmonary fibrosis. No acute abnormalities. Aortic Atherosclerosis (ICD10-I70.0) and Emphysema (ICD10-J43.9). Electronically Signed   By: Lavonia Dana M.D.   On: 10/23/2020 16:32    Scheduled Meds: . (feeding supplement) PROSource Plus  30 mL Oral BID BM  . enoxaparin (LOVENOX) injection  30 mg Subcutaneous Q24H  . lipase/protease/amylase  36,000 Units Oral TID AC  . mirtazapine  15 mg Oral QHS  . nystatin   Topical BID  . predniSONE  5 mg Oral Q breakfast   Continuous Infusions: . fluconazole (DIFLUCAN) IV    . lactated ringers    . lactated ringers 100 mL/hr at 10/23/20 2119  . sulfamethoxazole-trimethoprim       LOS: 1 day   Time spent: 35 minutes.  Patrecia Pour, MD [facility] [email or web address] 10/24/2020, 12:25 PM

## 2020-10-24 NOTE — Progress Notes (Addendum)
Initial Nutrition Assessment  DOCUMENTATION CODES:   Severe malnutrition in context of chronic illness,Underweight  INTERVENTION:   Boost Breeze po TID, each supplement provides 250 kcal and 9 grams of protein  23ml Prosource Plus po TID, each supplement provides 100 kcals and 15 grams of protein  MVI with minerals daily   If pt unable to tolerate PO intake/decision is made to pursue TPN, TPN management is to be per Pharmacy. Note: Monitor magnesium, potassium, and phosphorus daily for at least 3 days, MD to replete as needed, as pt is at risk for refeeding syndrome.  NUTRITION DIAGNOSIS:   Severe Malnutrition related to chronic illness as evidenced by energy intake < or equal to 75% for > or equal to 1 month,percent weight loss.  GOAL:   Patient will meet greater than or equal to 90% of their needs  MONITOR:   PO intake,Supplement acceptance,Diet advancement,Weight trends,Labs,I & O's  REASON FOR ASSESSMENT:   Consult Assessment of nutrition requirement/status,Poor PO,Wound healing  ASSESSMENT:   Pt admitted with severe sepsis 2/2 abdominal wall cellulitis. PMH includes UIP on 2L O2, chronic prednisone use, RA, h/o DVT/PE, CAD, and SBO s/p ex lap Feb 2022 requiring TPN and G tube placement (has since been removed).   Pt is ~ 3 months s/p ex lap for small bowel torsion with placement of G-tube. This has since been removed, but pt has had a moderate amount of drainage from the open abdominal wound and old G-tube site. This has led to significant skin breakdown and abdominal pain. Pt had a CT scan ~1 week ago with no evidence of abscess/gastrocutaneous fistula, but has been unable to manage the wound drainage, eat, or tolerate the pain. Per Surgeon, it is not clear how much drainage is coming from the old G-tube site. A superficial culture of the wound showed polymicrobial infection, so IV bactrim was initiated. WOC consult pending. Palliative Medicine consult also pending.    SLP performed BSE today; pt now on Dysphagia 1 diet with thin liquids due to inability to chew as no dysphagia was noted during evaluation. MD initiated pt on Remeron.   Pt reports continued poor appetite since previous discharge (Feb-March 2022) and now endorses odynophagia with worsening generalized weakness. No PO intake, though this is due to pt being NPO up until 1235 today. Pt unlikely to have received tray at this point. Of note, MD did report pt taking Prosource PO this AM. RN reports pt also tolerated afternoon dose. Recommend continuing this supplement and will order additional supplements to provide pt with additional kcals/protein. Pt tolerated Boost Breeze previously and reports issues with diary, so will begin with Boost as opposed to Ensure and will increase Prosource. Will need to monitor for results of repeat CT/pt's tolerance of PO diet/output from old G-tube site/need for TPN.   No UOP documented.   Medications: Creon TID before meals, Remeron, deltasone Labs: Mg 1.5 (L)  Reviewed weight history. Although pt's weight from today was 42.6 kg, will not use this weight to assess weight history or pt's nutritional needs as this weight is likely incorrect given it is higher than previous April weights despite no edema being documented and pt's very poor po intake. Will use admit weight of 40.3 kg as it is likely more accurate. Per weight readings, pt weighed 46.7kg on 04/29/20. This would indicate a 13.5% weight loss x 6 months, which is significant for timeframe. Given pt's significant weight loss, poor po intake for >1 month, and BMI<18, pt  meets criteria for severe malnutrition in context of chronic illness. Unable to obtain nutrition-focused physical exam at this time as this RD is working remotely; however, pt was noted to have severe fat and muscle depletions upon RD assessment on 09/03/20 and it is very unlikely that pt no longer has depletions given wt loss and reports of continued  poor intake. Will attempt to obtain NFPE at follow-up.   Diet Order:   Diet Order            DIET - DYS 1 Room service appropriate? Yes; Fluid consistency: Thin  Diet effective now                 EDUCATION NEEDS:   No education needs have been identified at this time  Skin:  Skin Assessment: Reviewed RN Assessment  Last BM:  PTA  Height:   Ht Readings from Last 1 Encounters:  10/24/20 5' 4.5" (1.638 m)    Weight:   Wt Readings from Last 1 Encounters:  10/24/20 42.6 kg    BMI:  Body mass index is 15.87 kg/m.  Estimated Nutritional Needs:   Kcal:  1400-1600  Protein:  70-80 grams  Fluid:  >1.4L    Larkin Ina, MS, RD, LDN RD pager number and weekend/on-call pager number located in [place].

## 2020-10-24 NOTE — Evaluation (Signed)
Clinical/Bedside Swallow Evaluation Patient Details  Name: [name] [name] MRN: [id number] Date of Birth: [birth year]  Today's Date: 10/24/2020 Time: SLP Start Time (ACUTE ONLY): 0950 SLP Stop Time (ACUTE ONLY): 1000 SLP Time Calculation (min) (ACUTE ONLY): 10 min  Past Medical History:  Past Medical History:  Diagnosis Date  . Allergic rhinitis   . Allergy   . CAD (coronary artery disease)    Mild CAD by cath 2008  . CHF (congestive heart failure) ([place])   . DJD (degenerative joint disease)    rheumatoid  . DVT (deep venous thrombosis) ([place])   . Dyslipidemia   . History of echocardiogram    Echo 12/2018: EF 60-65, mod asymmetric LVH, Gr 1 DD  . History of nuclear stress test    Myoview 5/17:  EF 74%, normal perfusion, low risk // Myoview 12/2018:  EF 89, very mild ischemia in inferoapical wall; Low Risk  . History of pulmonary embolism   . Hyperlipidemia   . Insomnia   . Osteoporosis   . Psoriasis   . Pulmonary fibrosis ([place])   . Rheumatoid arthritis [facility])    Past Surgical History:  Past Surgical History:  Procedure Laterality Date  . ABDOMINAL HYSTERECTOMY  1974  . APPENDECTOMY  1959  . CATARACT EXTRACTION    . LAPAROTOMY N/A 08/23/2020   Procedure: EXPLORATORY LAPAROTOMY small bowel resection gastrostomy tube placement;  Surgeon: Dwan Bolt, MD;  Location: [facility];  Service: General;  Laterality: N/A;  . LEFT HEART CATHETERIZATION WITH CORONARY ANGIOGRAM N/A 06/18/2013   Procedure: LEFT HEART CATHETERIZATION WITH CORONARY ANGIOGRAM;  Surgeon: Peter M Martinique, MD;  Location: [facility] CATH LAB;  Service: Cardiovascular;  Laterality: N/A;  . TONSILLECTOMY  1941, 1951  . TOTAL KNEE ARTHROPLASTY  1997, 2007   Bilateral   HPI:  [name] [name] is an 87 y.o. female with a history of UIP on 2L O2, chronic prednisone, RA, history of DVT/PE, mild CAD, and SBO s/p ex lap February 2022 with G tube placement since removed who returns to the ED 4/15 with worsening abdominal wound drainage and  pain. Pt reports difficulty swallowing pills, poor appetite. Will start TPN today.   Assessment / Plan / Recommendation Clinical Impression  Pt seen drinking Omnipaque as she prepares for a CT abdomen. Pt drinking with head minimally elevated. No signs of oropharyngeal dysphagia though she does occasionally report a choke or cough. Suggested tilting bed for inclined position and decreased risk of reflux without stresssing abdominal wound. Pt reports poorly fitting dental implants that she has removed, and that she has limited her diet to liquids. May be beneficial to discuss options that would include purees with increased calorie density with dietitian. RN has been giving her pills whole in puree without incident. Recommend dys 1/thin diet when able to resume POs. SLP Visit Diagnosis: Dysphagia, unspecified (R13.10)    Aspiration Risk  Mild aspiration risk;Risk for inadequate nutrition/hydration    Diet Recommendation Dysphagia 1 (Puree);Thin liquid   Liquid Administration via: Straw;Cup Medication Administration: Whole meds with puree Supervision: Patient able to self feed Compensations: Slow rate;Small sips/bites Postural Changes: Seated upright at 90 degrees    Other  Recommendations     Follow up Recommendations None      Frequency and Duration            Prognosis        Swallow Study   General HPI: [name] [name] is an 87 y.o. female with a history of UIP on  2L O2, chronic prednisone, RA, history of DVT/PE, mild CAD, and SBO s/p ex lap February 2022 with G tube placement since removed who returns to the ED 4/15 with worsening abdominal wound drainage and pain. Pt reports difficulty swallowing pills, poor appetite. Will start TPN today. Type of Study: Bedside Swallow Evaluation Previous Swallow Assessment: none Diet Prior to this Study: NPO Temperature Spikes Noted: No Respiratory Status: Room air History of Recent Intubation: No Behavior/Cognition:  Alert;Cooperative;Pleasant mood Oral Cavity Assessment: Within Functional Limits Oral Care Completed by SLP: No Oral Cavity - Dentition: Edentulous Self-Feeding Abilities: Able to feed self Patient Positioning: Partially reclined Baseline Vocal Quality: Hoarse Volitional Cough: Strong Volitional Swallow: Able to elicit    Oral/Motor/Sensory Function     Ice Chips Ice chips: Not tested   Thin Liquid Thin Liquid: Within functional limits Presentation: Straw;Self Fed    Nectar Thick Nectar Thick Liquid: Not tested   Honey Thick Honey Thick Liquid: Not tested   Puree Puree: Not tested   Solid     Solid: Not tested     Herbie Baltimore, MA CCC-SLP  Acute Rehabilitation Services Pager [phone] Office [phone]  Lynann Beaver 10/24/2020,10:28 AM

## 2020-10-24 NOTE — Evaluation (Signed)
Physical Therapy Evaluation Patient Details Name: [name] [name] MRN: [id number] DOB: 31-May-1931 Today's Date: 10/24/2020   History of Present Illness  Pt is 87 yo female admitted on 10/23/20 with hx of ex lap in Feb 2022, readmission Feb-March with complications, and continued to have issues with poor appetite, odynophagia, symptoms of fungal infection , and worsening weakness.  Pt now admitted with abdominal wall cellulitis (open wound).  Pt with PMH including UIP on 2L O2, chronic prednisone, RA, osteoporosis, HTN, history of DVT/PE, mild CAD, and SBO s/p ex lap February 2022 with G tube placement since removed  Clinical Impression  Pt admitted with above diagnosis. Pt presenting with lethargy, generalized weakness, and confusion.  She required min-mod A for transfers and with limited ambulation. Pt unable to provide accurate PLOF. She did have 0/5 dorsiflexion strength on R (that appears to be new), but no other definite neurological symptoms.  Nursing did report pt was in position that may have left in prolonged dorsiflexion.  Notified MD of 0/5 dorsiflexion.  Pt currently with functional limitations due to the deficits listed below (see PT Problem List). Pt will benefit from skilled PT to increase their independence and safety with mobility to allow discharge to the venue listed below.       Follow Up Recommendations SNF    Equipment Recommendations  Wheelchair cushion (measurements PT);Wheelchair (measurements PT)    Recommendations for Other Services       Precautions / Restrictions Precautions Precautions: Fall      Mobility  Bed Mobility Overal bed mobility: Needs Assistance Bed Mobility: Supine to Sit;Sit to Supine     Supine to sit: Min assist Sit to supine: Min assist   General bed mobility comments: Increased time, HOB elevated, min A to lift trunk; cues for weight shift to scoot forward    Transfers Overall transfer level: Needs assistance Equipment used: Rolling  walker (2 wheeled) Transfers: Sit to/from Stand Sit to Stand: Mod assist;From elevated surface         General transfer comment: Mod A to rise  Ambulation/Gait Ambulation/Gait assistance: Mod assist Gait Distance (Feet): 2 Feet Assistive device: Rolling walker (2 wheeled) Gait Pattern/deviations: Step-to pattern;Decreased stride length;Decreased dorsiflexion - right;Shuffle;Trunk flexed Gait velocity: decreased   General Gait Details: Side steps up toward [place]; limited by lethargy and R foot drop.  Requiring assist to move R leg to clear foot  Stairs            Wheelchair Mobility    Modified Rankin (Stroke Patients Only)       Balance Overall balance assessment: Needs assistance Sitting-balance support: Bilateral upper extremity supported Sitting balance-[name]: Poor Sitting balance - Comments: Requiring UE support but can maintain without physical assist   Standing balance support: Bilateral upper extremity supported Standing balance-[name]: Poor Standing balance comment: RequiringRW and min A                             Pertinent Vitals/Pain Pain Assessment: No/denies pain    Home Living Family/patient expects to be discharged to:: Unsure Living Arrangements: Alone Available Help at Discharge: Personal care attendant Type of Home: House Home Access: Stairs to enter   [employer] of Steps: 2 Home Layout: One level Home Equipment: Cane - single point;Walker - 2 wheels Additional Comments: Information from prior visit - pt unable to provide today    Prior Function Level of Independence: Needs assistance  Comments: Pt unable to provide PLOF.  From visit ~ 2 months ago she was independent with ambulation and had an aide a few hr/day 2 days/week.  Has had recent falls.  Did not in [facility] pt d/c to SNF for 2 weeks after last admission then to ALF with 24 hr care.     Hand Dominance   Dominant Hand: Right     Extremity/Trunk Assessment   Upper Extremity Assessment Upper Extremity Assessment: RUE deficits/detail;LUE deficits/detail;Generalized weakness RUE Deficits / Details: ROM WFL; MMT 4-/5 throughout RUE Sensation: WNL RUE Coordination: WNL LUE Deficits / Details: ROM WFL; MMT 4-/5 throughout LUE Sensation: WNL LUE Coordination: WNL    Lower Extremity Assessment Lower Extremity Assessment: LLE deficits/detail;RLE deficits/detail RLE Deficits / Details: ROM: WFL; MMT: 4/5 hip and knee, 0/5 ankle dorsiflexion, 3/5 plantarflexion; reports new foot drop RLE Sensation: WNL RLE Coordination: WNL LLE Deficits / Details: ROM WFL; MMT 4/5 throughout LLE Sensation: WNL LLE Coordination: WNL    Cervical / Trunk Assessment Cervical / Trunk Assessment: Kyphotic  Communication   Communication: Other (comment) (soft spoken)  Cognition Arousal/Alertness: Lethargic Behavior During Therapy: WFL for tasks assessed/performed Overall Cognitive Status: No family/caregiver present to determine baseline cognitive functioning                                 General Comments: Oriented to self, location, Spring of 2022, not situation.  Difficulty providing PLOF.      General Comments General comments (skin integrity, edema, etc.): Pt with new foot drop on R with 0/5 dorsiflexion strength.  No other neurological symptoms.  Nursing did report pt was positioned with bed in reverse trendelenberg to assist with swallowing and had slid down so that feet were pressed on foot board.  Notified MD of foot drop and positioning - neurological deficit vs peroneal nerve palsy?    Exercises     Assessment/Plan    PT Assessment Patient needs continued PT services  PT Problem List Decreased strength;Decreased mobility;Decreased safety awareness;Decreased range of motion;Decreased activity tolerance;Decreased balance;Decreased knowledge of use of DME;Cardiopulmonary status limiting activity;Decreased  cognition;Decreased coordination       PT Treatment Interventions DME instruction;Therapeutic activities;Gait training;Therapeutic exercise;Patient/family education;Balance training;Functional mobility training;Neuromuscular re-education    PT Goals (Current goals can be found in the Care Plan section)  Acute Rehab PT Goals Patient Stated Goal: unable to state PT Goal Formulation: Patient unable to participate in goal setting Time For Goal Achievement: 11/07/20 Potential to Achieve Goals: Good    Frequency Min 2X/week   Barriers to discharge Decreased caregiver support      Co-evaluation               AM-PAC PT "6 Clicks" Mobility  Outcome Measure Help needed turning from your back to your side while in a flat bed without using bedrails?: A Little Help needed moving from lying on your back to sitting on the side of a flat bed without using bedrails?: A Lot Help needed moving to and from a bed to a chair (including a wheelchair)?: A Lot Help needed standing up from a chair using your arms (e.g., wheelchair or bedside chair)?: A Lot Help needed to walk in hospital room?: A Lot Help needed climbing 3-5 steps with a railing? : A Lot 6 Click Score: 13    End of Session Equipment Utilized During Treatment: Gait belt Activity Tolerance: Patient limited by lethargy Patient  left: in bed;with call bell/phone within reach;with bed alarm set Nurse Communication: Mobility status;Other (comment) (foot drop and notified MD) PT Visit Diagnosis: Other abnormalities of gait and mobility (R26.89);Muscle weakness (generalized) (M62.81)    Time: 5110-2111 PT Time Calculation (min) (ACUTE ONLY): 32 min   Charges:   PT Evaluation $PT Eval Moderate Complexity: 1 Mod PT Treatments $Therapeutic Activity: 8-22 mins        Abran Richard, PT Acute Rehab Services Pager [phone] [facility] [phone]    Karlton Lemon 10/24/2020, 2:34 PM

## 2020-10-24 NOTE — Plan of Care (Signed)

## 2020-10-24 NOTE — Evaluation (Signed)
Occupational Therapy Evaluation Patient Details Name: [name] [name] MRN: [id number] DOB: 12-01-1930 Today's Date: 10/24/2020    History of Present Illness Pt is 87 yo female admitted on 10/23/20 with hx of ex lap in Feb 2022, readmission Feb-March with complications, and continued to have issues with poor appetite, odynophagia, symptoms of fungal infection , and worsening weakness.  Pt now admitted with abdominal wall cellulitis (open wound).  Pt with PMH including UIP on 2L O2, chronic prednisone, RA, osteoporosis, HTN, history of DVT/PE, mild CAD, and SBO s/p ex lap February 2022 with G tube placement since removed   Clinical Impression   Pt is unable to offer information regarding PLOF leading up to this admission due to confusion and lethargy, per chart she was at home. Prior to hospitalization in February, pt walked with a walker and could perform basic ADL independently. She had an aide to assist her with IADL 2 x per week. Pt presents with generalized weakness, pain and impaired sitting and standing balance requiring B UE support. She needs set up to total assist for ADL and min to mod assist for mobility. Recommending SNF upon discharge.     Follow Up Recommendations  SNF    Equipment Recommendations  None recommended by OT    Recommendations for Other Services       Precautions / Restrictions Precautions Precautions: Fall      Mobility Bed Mobility Overal bed mobility: Needs Assistance Bed Mobility: Supine to Sit;Sit to Supine     Supine to sit: Min assist Sit to supine: Min assist   General bed mobility comments: assist to elevated trunk and for LEs back into bed, increased time, +rail, HOB up    Transfers Overall transfer level: Needs assistance Equipment used: Rolling walker (2 wheeled) Transfers: Sit to/from Stand Sit to Stand: Mod assist;From elevated surface         General transfer comment: Mod A to rise and steady from EOB    Balance Overall balance  assessment: Needs assistance Sitting-balance support: Bilateral upper extremity supported Sitting balance-[name]: Poor Sitting balance - Comments: requires B UE support   Standing balance support: Bilateral upper extremity supported Standing balance-[name]: Poor Standing balance comment: Requires B UE support and min A                           ADL either performed or assessed with clinical judgement   ADL Overall ADL's : Needs assistance/impaired Eating/Feeding: Set up;Bed level   Grooming: Wash/dry hands;Sitting;Minimal assistance Grooming Details (indicate cue type and reason): assist for thoroughness Upper Body Bathing: Sitting;Maximal assistance   Lower Body Bathing: Total assistance;Sit to/from stand   Upper Body Dressing : Moderate assistance;Sitting   Lower Body Dressing: Total assistance;Sit to/from stand   Toilet Transfer: Moderate assistance;Stand-pivot;RW;BSC   Toileting- Clothing Manipulation and Hygiene: Total assistance;Sit to/from stand               Vision Patient Visual Report: No change from baseline       Perception     Praxis      Pertinent Vitals/Pain Pain Assessment: Faces Faces Pain Scale: Hurts even more Pain Location: "bunion under R toe" Pain Descriptors / Indicators: Discomfort Pain Intervention(s): Patient requesting pain meds-RN notified     Hand Dominance Right   Extremity/Trunk Assessment Upper Extremity Assessment Upper Extremity Assessment: Generalized weakness (arthritic changes in hands) RUE Deficits / Details: ROM WFL; MMT 4-/5 throughout RUE Sensation: WNL RUE  Coordination: WNL LUE Deficits / Details: ROM WFL; MMT 4-/5 throughout LUE Sensation: WNL LUE Coordination: WNL      Cervical / Trunk Assessment Cervical / Trunk Assessment: Kyphotic   Communication Communication Communication: Other (comment) (tangential, non sensical at times)   Cognition Arousal/Alertness: Lethargic Behavior  During Therapy: WFL for tasks assessed/performed Overall Cognitive Status: No family/caregiver present to determine baseline cognitive functioning                                 General Comments: oriented to self and that she was in the hospital, needed assist to answer the phone, difficulty explaining the pain she is experiencing, RN aware   General Comments    Exercises     Shoulder Instructions      Home Living Family/patient expects to be discharged to:: Unsure Living Arrangements: Alone Available Help at Discharge: Personal care attendant Type of Home: House Home Access: Stairs to enter [employer] of Steps: 2   Home Layout: One level     Bathroom Shower/Tub: Occupational psychologist: Handicapped height     Home Equipment: Cane - single point;Walker - 2 wheels   Additional Comments: Information from prior visit - pt unable to provide today      Prior Functioning/Environment Level of Independence: Needs assistance        Comments: Pt unable to provide PLOF.  From visit ~ 2 months ago she was independent with ambulation and had an aide a few hr/day 2 days/week.  Has had recent falls.  Did not in [facility] pt d/c to SNF for 2 weeks after last admission then to ALF with 24 hr care.        OT Problem List: Decreased strength;Decreased activity tolerance;Impaired balance (sitting and/or standing);Decreased cognition;Decreased knowledge of use of DME or AE;Pain      OT Treatment/Interventions: Self-care/ADL training;Therapeutic activities;Patient/family education;Balance training;Cognitive remediation/compensation    OT Goals(Current goals can be found in the care plan section) Acute Rehab OT Goals Patient Stated Goal: unable to state OT Goal Formulation: Patient unable to participate in goal setting Time For Goal Achievement: 11/07/20 Potential to Achieve Goals: Fair ADL Goals Pt Will Perform Grooming: with supervision;sitting Pt  Will Perform Upper Body Dressing: with min assist;sitting Pt Will Perform Lower Body Dressing: with mod assist;sit to/from stand Pt Will Transfer to Toilet: with min assist;ambulating;bedside commode Pt Will Perform Toileting - Clothing Manipulation and hygiene: with mod assist;sit to/from stand Additional ADL Goal #1: Pt will demonstrate fair sitting balance for participation in ADL.  OT Frequency: Min 2X/week   Barriers to D/C:            Co-evaluation              AM-PAC OT "6 Clicks" Daily Activity     Outcome Measure Help from another person eating meals?: A Little Help from another person taking care of personal grooming?: A Lot Help from another person toileting, which includes using toliet, bedpan, or urinal?: Total Help from another person bathing (including washing, rinsing, drying)?: A Lot Help from another person to put on and taking off regular upper body clothing?: A Lot Help from another person to put on and taking off regular lower body clothing?: Total 6 Click Score: 11   End of Session Equipment Utilized During Treatment: Rolling walker;Gait belt Nurse Communication: Patient requests pain meds  Activity Tolerance: Patient limited by pain;Patient  limited by fatigue;Patient limited by lethargy Patient left: in bed;with call bell/phone within reach;with bed alarm set  OT Visit Diagnosis: Unsteadiness on feet (R26.81);Other abnormalities of gait and mobility (R26.89);Pain;Muscle weakness (generalized) (M62.81);Other symptoms and signs involving cognitive function                Time: 1400-1420 OT Time Calculation (min): 20 min Charges:  OT General Charges $OT Visit: 1 Visit OT Evaluation $OT Eval Moderate Complexity: 1 Mod  Nestor Lewandowsky, OTR/L Acute Rehabilitation Services Pager: [phone] Office: [phone]  Malka So 10/24/2020, 3:12 PM

## 2020-10-25 DIAGNOSIS — I2510 Atherosclerotic heart disease of native coronary artery without angina pectoris: Secondary | ICD-10-CM | POA: Diagnosis not present

## 2020-10-25 DIAGNOSIS — L03311 Cellulitis of abdominal wall: Secondary | ICD-10-CM | POA: Diagnosis not present

## 2020-10-25 DIAGNOSIS — I5032 Chronic diastolic (congestive) heart failure: Secondary | ICD-10-CM | POA: Diagnosis not present

## 2020-10-25 DIAGNOSIS — A419 Sepsis, unspecified organism: Secondary | ICD-10-CM | POA: Diagnosis not present

## 2020-10-25 LAB — BASIC METABOLIC PANEL
Anion gap: 8 (ref 5–15)
BUN: 11 mg/dL (ref 8–23)
CO2: 28 mmol/L (ref 22–32)
Calcium: 7.9 mg/dL — ABNORMAL LOW (ref 8.9–10.3)
Chloride: 98 mmol/L (ref 98–111)
Creatinine, Ser: 0.72 mg/dL (ref 0.44–1.00)
GFR, Estimated: >60 mL/min (ref >60)
Glucose, Bld: 128 mg/dL — ABNORMAL HIGH (ref 70–99)
Potassium: 4.1 mmol/L (ref 3.5–5.1)
Sodium: 134 mmol/L — ABNORMAL LOW (ref 135–145)

## 2020-10-25 LAB — PHOSPHORUS: Phosphorus: 1.8 mg/dL — ABNORMAL LOW (ref 2.5–4.6)

## 2020-10-25 LAB — MAGNESIUM: Magnesium: 1.5 mg/dL — ABNORMAL LOW (ref 1.7–2.4)

## 2020-10-25 MED ORDER — MAGNESIUM SULFATE 2 GM/50ML IV SOLN
2.0000 g | Freq: Once | INTRAVENOUS | Status: AC
  Administered 2020-10-25: 2 g via INTRAVENOUS
  Filled 2020-10-25: qty 50

## 2020-10-25 MED ORDER — SODIUM PHOSPHATES 45 MMOLE/15ML IV SOLN
10.0000 mmol | Freq: Once | INTRAVENOUS | Status: AC
  Administered 2020-10-25: 10 mmol via INTRAVENOUS
  Filled 2020-10-25: qty 3.33

## 2020-10-25 NOTE — Progress Notes (Signed)
PROGRESS NOTE  [name] [name]  YTK:[id number] DOB: 03/23/1931 DOA: 10/23/2020 PCP: Hoyt Koch, MD  Outpatient Specialists: Dr. Zenia Resides (surgery)  Brief Narrative: [name] [name] is an 87 y.o. 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 She was admitted for a few weeks in February - March 7517 with complications after exploratory laparotomy requiring TPN and G tube placement which was ultimately removed, the patient discharge to SNF for less than 2 weeks, then returned home with 24 hours care. With close surgery follow up she was referred to the wound care center and has been having a wound RN from [facility] performing BID wound dressing changes, though she has continued to struggle for weeks with poor appetite, now odynophagia, symptoms of fungal infection, and over the past few days worsening generalized weakness. A superficial culture taken of the wound showed polymicrobial infection with plans to treat with bactrim though the family stated she was unlikely to be able to [name] the pills as it would upset her stomach.   ED Course: Tachycardic to 104, tachypneic to 21/min, though afebrile, with leukocytosis and elevation of lactic acid to 2.7. CXR showed no acute findings and respiratory status has been stable on home oxygen. Labs appear hemoconcentrated from priors with hyperkalemia (5.3) and hypernatremia (146). Blood cultures were drawn, sepsis diagnosed and IV antibiotics given as well as 30cc/kg IVF. General surgery has evaluated the patient and medicine called for admission.    Assessment & Plan: Principal Problem:   Abdominal wall cellulitis Active Problems:   CAD (coronary artery disease)   RA (rheumatoid arthritis) (HCC)   Chronic respiratory failure with hypoxia (HCC)   Chronic diastolic  CHF (congestive heart failure) (HCC)   Iron deficiency anemia   Personal history of PE (pulmonary embolism)   Unintentional weight loss   ILD (interstitial lung disease) (HCC)   Protein-calorie malnutrition, severe   Severe sepsis (HCC)  Severe sepsis: The patient was tachypneic, tachycardic with leukocytosis and increasing drainage from abdominal wound suggestive of cellulitis. Lactic acid 2.7 indicating end organ dysfunction. - Continue IV antibiotics. Based on culture data and hoping to minimize nephrotoxins (e.g. zosyn, vancomycin), will continue IV bactrim.  - Monitor culture data. Given severe sepsis at presentation, would not place PICC until 4/18 if blood cultures remain negative at that time.  Thrush:  - IV diflucan while unable to tolerate po reliably.  - Magic mouthwash.  Nonhealing abdominal wounds, MASD:  - Unable to stop prednisone. Hold other UIP medications for now. - Optimize nutrition. - Wound care consulted. For now surgery recommends BID W > D.  - Keep area as dry as possible, nystatin powder.  Dehydration, hypernatremia, severe protein-calorie malnutrition, failure to thrive, generalized weakness: Appears emaciated. Ketonuria without glucosuria.  - Dietitian consulted. Prealbumin is 8 - SLP evaluation > mild aspiration risk, unable to chew so dysphagia 1 diet, thin liquids recommended. No dysphagia noted.  - Remeron for appetite stimulation. - Continue IVF. Last EF preserved, indeterminate diastolic function, and no evidence of volume overload.  - Repeat labs still pending this morning.  - We're seeing some improvement in PO intake since admission, so hopeful to avoid TPN, but will push/encourage po intake as much as possible for now.  - PT, OT, TOC consulted  Hyperkalemic AKI: Improved.  -  Lokelma given with resolution. - IVF - Will need continued monitoring while on bactrim.  Hypomagnesemia:  - Supplement and monitor.   Chronic hypoxic respiratory  failure due to UIP: No infiltrate on CXR. At baseline. Included portions of lungs on CT abd are stable without acute findings. - Continue 2L O2.  Goals of care: These were introduced at admission with my fear that the combination of malnutrition, declining functional status, advanced age, and chronic immunosuppression suggest these wounds may never heal. The patient and family are very clear in their wishes at this time, however. They would like any and all available aggressive interventions with curative intent. This includes prolonged TPN, intensive wound care and physical therapy, IV antibiotics, and hospital monitoring.  - Full code confirmed. - Palliative care following as inpatient. They had an appointment with [facility] as outpatient pending.   Right foot drop: CT head without acute intracranial abnormality. Suspect stretch peroneal nerve palsy.  - PT/OT  Gallbladder distention, CBD dilatation: Stable appearance on CT abd/pelvis 4/16 without inflammatory findings. LFTs wnl and no focal pain overlying this area. No further evaluation currently planned.  DVT prophylaxis: Lovenox Code Status: Full Family Communication: HCPOA Gwen at bedside today.  Disposition Plan:  Status is: Inpatient  Remains inpatient appropriate because:IV treatments appropriate due to intensity of illness or inability to take PO   Dispo: The patient is from: Home              Anticipated d/c is to: TBD              Patient currently is not medically stable to d/c.   Difficult to place patient No  Consultants:   Surgery  Palliative care  Procedures:   None  Antimicrobials:  Ceftriaxone 4/15  Fluconazole 4/15 >>  Bactrim 4/15 >>  Subjective: Ate a good breakfast this morning. Abdominal pain overall much improved with medications as ordered. Motivated to rehabilitate, she's up to chair today. High spirits.   Objective: Vitals:   10/24/20 0811 10/24/20 1411 10/24/20 2053 10/25/20 0606  BP:  (!) 131/99 (!) 88/55 111/68 134/81  Pulse: 98 76 76 87  Resp: 17 17 17 20   Temp: 98 F (36.7 C) 97.6 F (36.4 C) 98.1 F (36.7 C) 98.1 F (36.7 C)  TempSrc: Oral Oral Oral Oral  SpO2: 99% 100% 97% 100%  Weight:    42.1 kg  Height:        Intake/Output Summary (Last 24 hours) at 10/25/2020 1232 Last data filed at 10/25/2020 2774 Gross per 24 hour  Intake 2968 ml  Output 250 ml  Net 2718 ml   Filed Weights   10/23/20 2013 10/24/20 0458 10/25/20 0606  Weight: 40.3 kg 42.6 kg 42.1 kg   Gen: Elderly cachectic very pleasant female in no distress Pulm: Nonlabored breathing room air in chair (no O2 on currently). Clear. CV: Regular rate and rhythm. No murmur, rub, or gallop. No JVD, no dependent edema. GI: Abdomen soft, much less tender, non-distended, with normoactive bowel sounds.  Ext: Warm, no deformities, decreased muscle bulk throughout Skin: No new rashes, lesions or ulcers on visualized skin. No erythema spreading from abdominal wound dressings which are c/d/i. Neuro: Alert and oriented. Isolated right foot drop stable from yesterday PM's exam.  Psych: Judgement and insight appear fair. Mood euthymic & affect congruent. Behavior is appropriate.    Data Reviewed: I have personally reviewed following labs and imaging studies  CBC: Recent Labs  Lab 10/23/20 1558 10/24/20 0519  WBC  11.4* 9.5  NEUTROABS 5.1  --   HGB 11.9* 12.0  HCT 38.7 38.6  MCV 101.0* 99.5  PLT 326 650   Basic Metabolic Panel: Recent Labs  Lab 10/23/20 1558 10/24/20 0519  NA 146* 139  K 5.3* 3.9  CL 110 106  CO2 26 25  GLUCOSE 92 69*  BUN 13 9  CREATININE 0.83 0.60  CALCIUM 9.1 8.3*  MG  --  1.5*  PHOS  --  2.5   GFR: Estimated Creatinine Clearance: 32.3 mL/min (by C-G formula based on SCr of 0.6 mg/dL). Liver Function Tests: Recent Labs  Lab 10/23/20 1558 10/24/20 0519  AST 32 17  ALT 15 11  ALKPHOS 121 106  BILITOT 0.9 0.6  PROT 7.0 5.5*  ALBUMIN 2.7* 2.1*   No results for  input(s): LIPASE, AMYLASE in the last 168 hours. No results for input(s): AMMONIA in the last 168 hours. Coagulation Profile: Recent Labs  Lab 10/23/20 1558  INR 1.0   Cardiac Enzymes: No results for input(s): CKTOTAL, CKMB, CKMBINDEX, TROPONINI in the last 168 hours. BNP (last 3 results) Recent Labs    06/03/20 1001 09/29/20 1425  PROBNP 100 58.0   HbA1C: No results for input(s): HGBA1C in the last 72 hours. CBG: No results for input(s): GLUCAP in the last 168 hours. Lipid Profile: No results for input(s): CHOL, HDL, LDLCALC, TRIG, CHOLHDL, LDLDIRECT in the last 72 hours. Thyroid Function Tests: No results for input(s): TSH, T4TOTAL, FREET4, T3FREE, THYROIDAB in the last 72 hours. Anemia Panel: Recent Labs    10/23/20 1958  VITAMINB12 1,231*   Urine analysis:    Component Value Date/Time   COLORURINE YELLOW 10/16/2020 2145   APPEARANCEUR CLEAR 10/16/2020 2145   LABSPEC >1.046 (H) 10/16/2020 2145   PHURINE 5.0 10/16/2020 2145   GLUCOSEU NEGATIVE 10/16/2020 2145   GLUCOSEU NEGATIVE 01/03/2020 1434   HGBUR SMALL (A) 10/16/2020 2145   BILIRUBINUR NEGATIVE 10/16/2020 2145   BILIRUBINUR N 01/24/2014 1354   KETONESUR 5 (A) 10/16/2020 2145   PROTEINUR NEGATIVE 10/16/2020 2145   UROBILINOGEN 4.0 (A) 01/03/2020 1434   NITRITE NEGATIVE 10/16/2020 2145   LEUKOCYTESUR NEGATIVE 10/16/2020 2145   Recent Results (from the past 240 hour(s))  Aerobic/Anaerobic Culture w Gram Stain (surgical/deep wound)     Status: None   Collection Time: 10/16/20  9:34 AM   Specimen: Abdomen  Result Value Ref Range Status   Specimen Description   Final    ABDOMEN LEFT G TUBE SITE Performed at [facility] [address] [place], [place] [address]    Special Requests   Final    NONE Performed at [facility], [place] [address] [place], [place] [address]    Gram Stain   Final    RARE WBC PRESENT, PREDOMINANTLY PMN RARE GRAM POSITIVE COCCI RARE GRAM  VARIABLE ROD    Culture   Final    RARE STAPHYLOCOCCUS AUREUS FEW ENTEROBACTER CLOACAE FEW MORGANELLA MORGANII NO ANAEROBES ISOLATED Performed at [facility], [place] [address] [place], [place] [address]    Report Status 10/22/2020 FINAL  Final   Organism ID, Bacteria STAPHYLOCOCCUS AUREUS  Final   Organism ID, Bacteria ENTEROBACTER CLOACAE  Final   Organism ID, Bacteria MORGANELLA MORGANII  Final      Susceptibility   Enterobacter cloacae - MIC*    CEFAZOLIN >=64 RESISTANT Resistant     CEFEPIME <=0.12 SENSITIVE Sensitive     CEFTAZIDIME <=1 SENSITIVE Sensitive     CIPROFLOXACIN <=  0.25 SENSITIVE Sensitive     GENTAMICIN <=1 SENSITIVE Sensitive     IMIPENEM <=0.25 SENSITIVE Sensitive     TRIMETH/SULFA <=20 SENSITIVE Sensitive     PIP/TAZO 8 SENSITIVE Sensitive     * FEW ENTEROBACTER CLOACAE   Morganella morganii - MIC*    AMPICILLIN >=32 RESISTANT Resistant     CEFAZOLIN >=64 RESISTANT Resistant     CEFTAZIDIME >=64 RESISTANT Resistant     CIPROFLOXACIN <=0.25 SENSITIVE Sensitive     GENTAMICIN <=1 SENSITIVE Sensitive     IMIPENEM <=0.25 SENSITIVE Sensitive     TRIMETH/SULFA <=20 SENSITIVE Sensitive     AMPICILLIN/SULBACTAM >=32 RESISTANT Resistant     PIP/TAZO <=4 SENSITIVE Sensitive     * FEW MORGANELLA MORGANII   Staphylococcus aureus - MIC*    CIPROFLOXACIN >=8 RESISTANT Resistant     ERYTHROMYCIN <=0.25 SENSITIVE Sensitive     GENTAMICIN <=0.5 SENSITIVE Sensitive     OXACILLIN >=4 RESISTANT Resistant     TETRACYCLINE <=1 SENSITIVE Sensitive     VANCOMYCIN <=0.5 SENSITIVE Sensitive     TRIMETH/SULFA <=10 SENSITIVE Sensitive     CLINDAMYCIN <=0.25 SENSITIVE Sensitive     RIFAMPIN <=0.5 SENSITIVE Sensitive     Inducible Clindamycin NEGATIVE Sensitive     * RARE STAPHYLOCOCCUS AUREUS  Blood Culture (routine x 2)     Status: None (Preliminary result)   Collection Time: 10/23/20  3:50 PM   Specimen: Site Not Specified; Blood  Result Value Ref Range Status    Specimen Description   Final    SITE NOT SPECIFIED Performed at [facility], [place] [address], [place], [place] [address]    Special Requests   Final    BOTTLES DRAWN AEROBIC AND ANAEROBIC Blood Culture results may not be optimal due to an inadequate volume of blood received in culture bottles Performed at [place] [place] [address], [place], [place] [address]    Culture   Final    NO GROWTH < 24 HOURS Performed at [facility] [address] [place], [place] [address]    Report Status PENDING  Incomplete  Blood Culture (routine x 2)     Status: None (Preliminary result)   Collection Time: 10/23/20  4:00 PM   Specimen: BLOOD  Result Value Ref Range Status   Specimen Description   Final    BLOOD SITE NOT SPECIFIED Performed at [employer] [address], [place], [place] [address]    Special Requests   Final    BOTTLES DRAWN AEROBIC AND ANAEROBIC Blood Culture results may not be optimal due to an inadequate volume of blood received in culture bottles Performed at [place] [address] [place], [place] [address]    Culture   Final    NO GROWTH < 24 HOURS Performed at [place] [address] [place],  [address]    Report Status PENDING  Incomplete  Resp Panel by RT-PCR (Flu A&B, Covid) Nasopharyngeal Swab     Status: None   Collection Time: 10/23/20  5:34 PM   Specimen: Nasopharyngeal Swab; Nasopharyngeal(NP) swabs in vial transport medium  Result Value Ref Range Status   SARS Coronavirus 2 by RT PCR NEGATIVE NEGATIVE Final    Comment: (NOTE) SARS-CoV-2 target nucleic acids are NOT DETECTED.  The SARS-CoV-2 RNA is generally detectable in upper respiratory specimens during the acute phase of infection. The lowest concentration of SARS-CoV-2 viral copies this assay can detect  is 138 copies/mL. A negative result does not preclude SARS-Cov-2 infection and should not be  used as the sole basis for treatment or other patient management decisions. A negative result may occur with  improper specimen collection/handling, submission of specimen other than nasopharyngeal swab, presence of viral mutation(s) within the areas targeted by this assay, and inadequate number of viral copies(<138 copies/mL). A negative result must be combined with clinical observations, patient history, and epidemiological information. The expected result is Negative.  Fact Sheet for Patients:  EntrepreneurPulse.com.au  Fact Sheet for Healthcare Providers:  IncredibleEmployment.be  This test is no t yet approved or cleared by the [place] FDA and  has been authorized for detection and/or diagnosis of SARS-CoV-2 by FDA under an Emergency Use Authorization (EUA). This EUA will remain  in effect (meaning this test can be used) for the duration of the COVID-19 declaration under Section 564(b)(1) of the Act, 21 U.S.C.section 360bbb-3(b)(1), unless the authorization is terminated  or revoked sooner.       Influenza A by PCR NEGATIVE NEGATIVE Final   Influenza B by PCR NEGATIVE NEGATIVE Final    Comment: (NOTE) The Xpert Xpress SARS-CoV-2/FLU/RSV plus assay is intended as an aid in the diagnosis of influenza from Nasopharyngeal swab specimens and should not be used as a sole basis for treatment. Nasal washings and aspirates are unacceptable for Xpert Xpress SARS-CoV-2/FLU/RSV testing.  Fact Sheet for Patients: EntrepreneurPulse.com.au  Fact Sheet for Healthcare Providers: IncredibleEmployment.be  This test is not yet approved or cleared by the [place] FDA and has been authorized for detection and/or diagnosis of SARS-CoV-2 by FDA under an Emergency Use Authorization (EUA). This EUA will remain in effect (meaning this test can be used) for the duration of the COVID-19 declaration under Section  564(b)(1) of the Act, 21 U.S.C. section 360bbb-3(b)(1), unless the authorization is terminated or revoked.  Performed at [facility], [place] [address] [place], [place] [address]       Radiology Studies: CT ABDOMEN PELVIS WO CONTRAST  Result Date: 10/24/2020 CLINICAL DATA:  Poor appetite. Fungal infection. Worsening weakness. Abdominal wall cellulitis. EXAM: CT ABDOMEN AND PELVIS WITHOUT CONTRAST TECHNIQUE: Multidetector CT imaging of the abdomen and pelvis was performed following the standard protocol without IV contrast. COMPARISON:  10/16/2020 FINDINGS: Lower chest: Interstitial lung disease at the bases, again consistent with usual interstitial pneumonitis. Mild cardiomegaly with trace bilateral pleural fluid. Multivessel coronary artery atherosclerosis. Hepatobiliary: Normal noncontrast appearance of the liver. In addition to lack of IV contrast, arm position degraded exam. The gallbladder is mildly distended, similar to on the prior. No pericholecystic edema or calcified stone. The common duct measures up to 1.2 cm on 33/4 in the pancreatic head, similar. Pancreas: Not well evaluated.  Grossly within normal limits. Spleen: Normal in size, without focal abnormality. Adrenals/Urinary Tract: Normal adrenal glands. No renal calculi or hydronephrosis. The low pelvis is excluded. No upper bladder stones. Stomach/Bowel: Normal stomach, without wall thickening. Extensive colonic diverticulosis. Normal terminal ileum. The cecum is position within the right deep pelvis. The appendix is likely partially visualized on 65/4 and within normal limits. Normal small bowel caliber. Vascular/Lymphatic: Aortic atherosclerosis. No abdominopelvic adenopathy. Reproductive: Hysterectomy. Other: No abdominal ascites or free intraperitoneal air. Midline anterior abdominal wall defect again identified. Musculoskeletal: Osteopenia. Lumbosacral spondylosis with mild convex left lumbar spine curvature.  IMPRESSION: 1. Multifactorial degradation, including lack of IV contrast, arm position, and exclusion of the low pelvis. 2. Given this limitation, no acute process in the abdomen  or pelvis. 3. Interstitial lung disease, consistent with usual interstitial pneumonitis, as before. 4. Trace bilateral pleural fluid. 5. Similar gallbladder distension and common duct dilatation, of indeterminate etiology. Consider correlation with bilirubin levels. 6. Coronary artery atherosclerosis. Aortic Atherosclerosis (ICD10-I70.0). Electronically Signed   By: Abigail Miyamoto M.D.   On: 10/24/2020 17:27   CT HEAD WO CONTRAST  Result Date: 10/24/2020 CLINICAL DATA:  Mental status changes of unknown cause. EXAM: CT HEAD WITHOUT CONTRAST TECHNIQUE: Contiguous axial images were obtained from the base of the skull through the vertex without intravenous contrast. COMPARISON:  01/24/2020 FINDINGS: Brain: There is mild central and cortical atrophy. Periventricular white matter changes are consistent with small vessel disease. There is no intra or extra-axial fluid collection or mass lesion. The basilar cisterns and ventricles have a normal appearance. There is no CT evidence for acute infarction or hemorrhage. Vascular: There is atherosclerotic calcification of the internal carotid arteries. No hyperdense vessels. Skull: Normal. Negative for fracture or focal lesion. Sinuses/Orbits: Air-fluid level again identified within the LEFT sphenoid air cells. Improved aeration of the ethmoid sinuses. Other: None. IMPRESSION: 1. No evidence for acute intracranial abnormality. 2. Atrophy and small vessel disease. 3. Improved aeration of the ethmoid sinuses. Persistent air-fluid level in the LEFT sphenoid air cells. Electronically Signed   By: Nolon Nations M.D.   On: 10/24/2020 17:14   DG Chest Port 1 View  Result Date: 10/23/2020 CLINICAL DATA:  Sepsis, G-tube placed in February 2022, nonhealing wound EXAM: PORTABLE CHEST 1 VIEW COMPARISON:   Portable exam 1609 hours compared to 08/24/2020 FINDINGS: Normal heart size, mediastinal contours, and pulmonary vascularity. Atherosclerotic calcification aorta. Severe pulmonary fibrosis with peripheral and basilar predominance unchanged since an earlier study of 02/12/2020. Underlying emphysematous and bronchitic changes. No definite acute infiltrate, pleural effusion, or pneumothorax. Dextroconvex thoracic scoliosis. IMPRESSION: Changes of COPD and severe pulmonary fibrosis. No acute abnormalities. Aortic Atherosclerosis (ICD10-I70.0) and Emphysema (ICD10-J43.9). Electronically Signed   By: Lavonia Dana M.D.   On: 10/23/2020 16:32    Scheduled Meds: . (feeding supplement) PROSource Plus  30 mL Oral TID BM  . enoxaparin (LOVENOX) injection  30 mg Subcutaneous Q24H  . feeding supplement  1 Container Oral TID BM  . lipase/protease/amylase  36,000 Units Oral TID AC  . magic mouthwash w/lidocaine  5 mL Oral TID  . mirtazapine  15 mg Oral QHS  . multivitamin with minerals  1 tablet Oral Daily  . nystatin   Topical BID  . predniSONE  5 mg Oral Q breakfast   Continuous Infusions: . fluconazole (DIFLUCAN) IV 100 mg (10/24/20 1816)  . lactated ringers    . lactated ringers Stopped (10/25/20 0615)  . sulfamethoxazole-trimethoprim 160 mg (10/24/20 2218)     LOS: 2 days   Time spent: 35 minutes.  Patrecia Pour, MD [facility] [email or web address] 10/25/2020, 12:32 PM

## 2020-10-26 DIAGNOSIS — I5032 Chronic diastolic (congestive) heart failure: Secondary | ICD-10-CM | POA: Diagnosis not present

## 2020-10-26 DIAGNOSIS — L03311 Cellulitis of abdominal wall: Secondary | ICD-10-CM | POA: Diagnosis not present

## 2020-10-26 DIAGNOSIS — A419 Sepsis, unspecified organism: Secondary | ICD-10-CM | POA: Diagnosis not present

## 2020-10-26 DIAGNOSIS — I2510 Atherosclerotic heart disease of native coronary artery without angina pectoris: Secondary | ICD-10-CM | POA: Diagnosis not present

## 2020-10-26 LAB — BASIC METABOLIC PANEL
Anion gap: 11 (ref 5–15)
BUN: 10 mg/dL (ref 8–23)
CO2: 28 mmol/L (ref 22–32)
Calcium: 8.2 mg/dL — ABNORMAL LOW (ref 8.9–10.3)
Chloride: 99 mmol/L (ref 98–111)
Creatinine, Ser: 0.68 mg/dL (ref 0.44–1.00)
GFR, Estimated: >60 mL/min (ref >60)
Glucose, Bld: 76 mg/dL (ref 70–99)
Potassium: 4.1 mmol/L (ref 3.5–5.1)
Sodium: 138 mmol/L (ref 135–145)

## 2020-10-26 LAB — PHOSPHORUS: Phosphorus: 2.9 mg/dL (ref 2.5–4.6)

## 2020-10-26 LAB — MAGNESIUM: Magnesium: 2 mg/dL (ref 1.7–2.4)

## 2020-10-26 MED ORDER — GERHARDT'S BUTT CREAM
TOPICAL_CREAM | Freq: Two times a day (BID) | CUTANEOUS | Status: DC
  Filled 2020-10-26: qty 1

## 2020-10-26 MED ORDER — FLUCONAZOLE 100 MG PO TABS
100.0000 mg | ORAL_TABLET | Freq: Every day | ORAL | Status: DC
  Administered 2020-10-26 – 2020-11-04 (×7): 100 mg via ORAL
  Filled 2020-10-26 (×10): qty 1

## 2020-10-26 MED ORDER — SULFAMETHOXAZOLE-TRIMETHOPRIM 800-160 MG PO TABS
1.0000 | ORAL_TABLET | Freq: Two times a day (BID) | ORAL | Status: AC
  Administered 2020-10-26 – 2020-10-27 (×4): 1 via ORAL
  Filled 2020-10-26 (×4): qty 1

## 2020-10-26 NOTE — Telephone Encounter (Signed)
Admitted currently 

## 2020-10-26 NOTE — Consult Note (Deleted)
WOC discussed case with [facility] PA, no needs for WOC at this time.  CC[facility] PA will address dressing changes.   [place], [place], [place]

## 2020-10-26 NOTE — Consult Note (Signed)
No NPWT devices available from portable equipment today.  I will follow up later today and if not tomorrow to place. Notified [facility] PA as well.   [place], [place], [place]

## 2020-10-26 NOTE — TOC Initial Note (Addendum)
Transition of Care [facility]) - Initial/Assessment Note    Patient Details  Name: [name] [name] MRN: [id number] Date of Birth: 1930-11-16  Transition of Care [facility]) CM/SW Contact:    Trish Mage, LCSW Phone Number: 10/26/2020, 2:59 PM  Clinical Narrative:  Spoke with Ms Jannifer Franklin, sister and HCPOA, in follow up to PT, OT recommendation of SNF.  Ms Jannifer Franklin acknowledges that Ms Nishi's care has become much more involved with wound vac and TPN, as well as concerns about not recently taking in enough nutrition to maintain strength to stay in the home. She states that patient was at [facility] in the recent past for care and had a good experience there.  Would like information sent there, as well as others in the area. Bed search initiated. TOC will continue to follow during the course of hospitalization.  Addendum:  Stopped by to see patient, who confirmed wish to go short term rehab-remembers the care at [facility] being "1st class," but also acknowledges that "things change over time-it may be different now."                 Expected Discharge Plan: [place] Barriers to Discharge: SNF Pending bed offer   Patient Goals and CMS Choice     Choice offered to / list presented to : [facility] POA / Guardian (sister Ms Jannifer Franklin)  Expected Discharge Plan and Services Expected Discharge Plan: [place]   Discharge Planning Services: CM Consult Post Acute Care Choice: [place] Living arrangements for the past 2 months: Single Family Home                                      Prior Living Arrangements/Services Living arrangements for the past 2 months: Single Family Home Lives with:: Self Patient language and need for interpreter reviewed:: Yes        Need for Family Participation in Patient Care: Yes (Comment) Care giver support system in place?: Yes (comment) Current home services: Homehealth aide,Home RN Criminal Activity/Legal Involvement  Pertinent to Current Situation/Hospitalization: No - Comment as needed  Activities of Daily Living Home Assistive Devices/Equipment: Dentures (specify type),Cane (specify quad or straight),Other (Comment),Walker (specify type) (upper and lower dentures, single point cane, walk in shower, handicap height shower) ADL Screening (condition at time of admission) Patient's cognitive ability adequate to safely complete daily activities?: Yes Is the patient deaf or have difficulty hearing?: No Does the patient have difficulty seeing, even when wearing glasses/contacts?: No Does the patient have difficulty concentrating, remembering, or making decisions?: No Patient able to express need for assistance with ADLs?: Yes Does the patient have difficulty dressing or bathing?: Yes Independently performs ADLs?: No Communication: Independent Dressing (OT): Needs assistance Is this a change from baseline?: Pre-admission baseline Grooming: Independent Feeding: Independent Bathing: Needs assistance Is this a change from baseline?: Pre-admission baseline Toileting: Needs assistance Is this a change from baseline?: Pre-admission baseline In/Out Bed: Needs assistance Is this a change from baseline?: Pre-admission baseline Walks in Home: Needs assistance Is this a change from baseline?: Pre-admission baseline Does the patient have difficulty walking or climbing stairs?: Yes Weakness of Legs: Both Weakness of Arms/Hands: Both  Permission Sought/Granted Permission sought to share information with : Family Supports Permission granted to share information with : No  Share Information with NAME: Vinnie Level, sister, [phone]   [phone]  Emotional Assessment       Orientation: : Oriented to Self Alcohol / Substance Use: Not Applicable Psych Involvement: No (comment)  Admission diagnosis:  FTT (failure to thrive) in adult [R62.7] Abdominal wound dehiscence, subsequent encounter  [T81.30XD] Severe sepsis (HCC) [A41.9, R65.20] Sepsis, due to unspecified organism, unspecified whether acute organ dysfunction present [facility]) [A41.9] Patient Active Problem List   Diagnosis Date Noted  . Abdominal wall cellulitis 10/24/2020  . Severe sepsis ([place]) 10/23/2020  . Abdominal pain 10/20/2020  . Diarrhea 10/02/2020  . Pressure injury of skin 09/06/2020  . Acute on chronic respiratory failure with hypoxemia ([place]) 08/23/2020  . Protein-calorie malnutrition, severe 08/20/2020  . Fall 01/25/2020  . Other fatigue 01/03/2020  . Left shoulder pain 01/03/2020  . Urinary frequency 01/03/2020  . Acute otalgia, left 09/06/2019  . Unintentional weight loss 07/18/2019  . Personal history of PE (pulmonary embolism) 04/23/2019  . Headache 02/11/2019  . Iron deficiency anemia 12/21/2018  . Cold sore 10/23/2018  . Blood in stool 09/14/2018  . 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 Therapeutic drug monitoring 07/17/2018  . Chronic diastolic CHF (congestive heart failure) ([place]) 12/19/2017  . Rash 08/11/2017  . Chronic respiratory failure with hypoxia ([place]) 03/30/2017  . Angular cheilitis 03/08/2017  . Leg pain 10/25/2016  . Back pain 08/05/2016  . RA (rheumatoid arthritis) ([place]) 03/31/2015  . Allergic rhinitis   . Varicose vein 09/11/2014  . ILD (interstitial lung disease) ([place]) 06/25/2012  . Chest pain 02/27/2012  . CAD (coronary artery disease) 02/27/2012  . Pruritus 08/18/2009  . Postinflammatory pulmonary fibrosis / RA ILD  06/30/2008  . Constipation 01/03/2008  . Dyslipidemia 06/16/2007   PCP:  Hoyt Koch, MD Pharmacy:   [facility] - [address], [place] [address] Phone: [phone] Fax: [place], [place] - [place] AT [facility] OF [place] [address] Phone: [phone] Fax: [phone]     Social  Determinants of Health (SDOH) Interventions    Readmission Risk Interventions No flowsheet data found.

## 2020-10-26 NOTE — Progress Notes (Signed)
PROGRESS NOTE  [name] [name]  TOI:[id number] DOB: 02/15/1931 DOA: 10/23/2020 PCP: Hoyt Koch, MD  Outpatient Specialists: Dr. Zenia Resides (surgery)  Brief Narrative: [name] [name] is an 87 y.o. 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 She was admitted for a few weeks in February - March 8338 with complications after exploratory laparotomy requiring TPN and G tube placement which was ultimately removed, the patient discharge to SNF for less than 2 weeks, then returned home with 24 hours care. With close surgery follow up she was referred to the wound care center and has been having a wound RN from [facility] performing BID wound dressing changes, though she has continued to struggle for weeks with poor appetite, now odynophagia, symptoms of fungal infection, and over the past few days worsening generalized weakness. A superficial culture taken of the wound showed polymicrobial infection with plans to treat with bactrim though the family stated she was unlikely to be able to swallow the pills as it would upset her stomach.   ED Course: Tachycardic to 104, tachypneic to 21/min, though afebrile, with leukocytosis and elevation of lactic acid to 2.7. CXR showed no acute findings and respiratory status has been stable on home oxygen. Labs appear hemoconcentrated from priors with hyperkalemia (5.3) and hypernatremia (146). Blood cultures were drawn, sepsis diagnosed and IV antibiotics given as well as 30cc/kg IVF. General surgery has evaluated the patient and medicine called for admission.    Assessment & Plan: Principal Problem:   Abdominal wall cellulitis Active Problems:   CAD (coronary artery disease)   RA (rheumatoid arthritis) (HCC)   Chronic respiratory failure with hypoxia (HCC)   Chronic diastolic  CHF (congestive heart failure) (HCC)   Iron deficiency anemia   Personal history of PE (pulmonary embolism)   Unintentional weight loss   ILD (interstitial lung disease) (HCC)   Protein-calorie malnutrition, severe   Severe sepsis (HCC)  Severe sepsis: The patient was tachypneic, tachycardic with leukocytosis and increasing drainage from abdominal wound suggestive of cellulitis. Lactic acid 2.7 indicating end organ dysfunction. - Aim to complete antibiotics after 5 days. Based on culture data and hoping to minimize nephrotoxins (e.g. zosyn, vancomycin), will continue bactrim > change to PO now that po intake improving.  - Monitor culture data.  [place]:  - Can change to po diflucan  - Magic mouthwash.  Nonhealing abdominal wounds, MASD:  - Unable to stop prednisone. Hold other UIP medications for now. - Optimize nutrition. Pt currently taking better po. - Wound care per general surgery > initiate wound vac when supplies available.  - Keep with barrier cream  Dehydration, hypernatremia, severe protein-calorie malnutrition, failure to thrive, generalized weakness: Appears emaciated. Ketonuria without glucosuria.  - Dietitian consulted. Prealbumin is 8 - SLP evaluation > mild aspiration risk, unable to chew so dysphagia 1 diet, thin liquids recommended. No dysphagia noted.  - Remeron for appetite stimulation. - Continue IVF. Last EF preserved, indeterminate diastolic function, and no evidence of volume overload.  [name] [name] seeing some improvement in PO intake since admission, so hopeful to avoid TPN, but will push/encourage po intake as much as possible for now.  - PT, OT, TOC consulted  Acute delirium: Requiring redirection this AM while family away from bedside.  - Delirium precautions, prefer to avoid sedating agents as  much as possible.  Hyperkalemic AKI: Improved.  [name] given with resolution. - IVF - Will need continued monitoring while on bactrim.  Hypokalemia,  hypomagnesemia, hypophosphatemia:  - At risk of refeeding syndrome, continue checking regularly and supplementing as indicated.  Chronic hypoxic respiratory failure due to UIP: No infiltrate on CXR. At baseline. Included portions of lungs on CT abd are stable without acute findings. - Continue 2L O2.  Goals of care: These were introduced at admission with my fear that the combination of malnutrition, declining functional status, advanced age, and chronic immunosuppression suggest these wounds may never heal. The patient and family are very clear in their wishes at this time, however. They would like any and all available aggressive interventions with curative intent. This includes prolonged TPN, intensive wound care and physical therapy, IV antibiotics, and hospital monitoring.  - Full code confirmed. - Palliative care following as inpatient. They had an appointment with [facility] as outpatient pending.   Right foot drop: CT head without acute intracranial abnormality. Suspect stretch peroneal nerve palsy.  - PT/OT  Gallbladder distention, CBD dilatation: Stable appearance on CT abd/pelvis 4/16 without inflammatory findings. LFTs wnl and no focal pain overlying this area. No further evaluation currently planned.  DVT prophylaxis: Lovenox Code Status: Full Family Communication: HCPOA Gwen by phone. None at bedside this AM.  Disposition Plan:  Status is: Inpatient  Remains inpatient appropriate because:IV treatments appropriate due to intensity of illness or inability to take PO   Dispo: The patient is from: Home              Anticipated d/c is to: TBD              Patient currently is not medically stable to d/c.   Difficult to place patient No  Consultants:   Surgery  Palliative care  Procedures:   None  Antimicrobials:  Ceftriaxone 4/15  Fluconazole 4/15 >>  Bactrim 4/15 >>  Subjective: Pt trying to get out of bed this morning, pulled out IV, incompletely oriented.  No focal deficits and does recall me, converses reasonably well. Says she ate a lot of breakfast and is drinking ensure, does NOT want to start PICC line/TPN at this time. She is amenable to wound vac. No fevers, does not report mouth pain today.  Objective: Vitals:   10/25/20 1357 10/25/20 2029 10/26/20 0500 10/26/20 0544  BP: 94/64 98/64  101/62  Pulse: 81 79  73  Resp: 16 20  17   Temp: 97.6 F (36.4 C) 98.3 F (36.8 C)  97.8 F (36.6 C)  TempSrc: Oral Oral    SpO2: 97% 95%  100%  Weight:   41.2 kg   Height:        Intake/Output Summary (Last 24 hours) at 10/26/2020 1124 Last data filed at 10/26/2020 0600 Gross per 24 hour  Intake 928 ml  Output --  Net 928 ml   Filed Weights   10/24/20 0458 10/25/20 0606 10/26/20 0500  Weight: 42.6 kg 42.1 kg 41.2 kg   Gen: Elderly frail female in no distress Pulm: Nonlabored breathing. Clear. CV: Regular rate and rhythm. No murmur, rub, or gallop. No JVD, no pitting dependent edema. GI: Abdomen soft, non-tender, non-distended, with normoactive bowel sounds.  Ext: Warm, no deformities Skin: No new rashes, lesions or ulcers on visualized skin. Abdominal wounds not inspected today after surgery evaluation. Neuro: Drowsy but rousable, oriented to person, place, time and location for me. Moves all extremities, though generalized weakness limits exam.  Psych: Judgement and insight appear marginal.  Data Reviewed: I have personally reviewed following labs and imaging studies  CBC: Recent Labs  Lab 10/23/20 1558 10/24/20 0519  WBC 11.4* 9.5  NEUTROABS 5.1  --   HGB 11.9* 12.0  HCT 38.7 38.6  MCV 101.0* 99.5  PLT 326 175   Basic Metabolic Panel: Recent Labs  Lab 10/23/20 1558 10/24/20 0519 10/25/20 1758 10/26/20 0453  NA 146* 139 134* 138  K 5.3* 3.9 4.1 4.1  CL 110 106 98 99  CO2 26 25 28 28   GLUCOSE 92 69* 128* 76  BUN 13 9 11 10   CREATININE 0.83 0.60 0.72 0.68  CALCIUM 9.1 8.3* 7.9* 8.2*  MG  --  1.5* 1.5* 2.0  PHOS  --   2.5 1.8* 2.9   GFR: Estimated Creatinine Clearance: 31.6 mL/min (by C-G formula based on SCr of 0.68 mg/dL). Liver Function Tests: Recent Labs  Lab 10/23/20 1558 10/24/20 0519  AST 32 17  ALT 15 11  ALKPHOS 121 106  BILITOT 0.9 0.6  PROT 7.0 5.5*  ALBUMIN 2.7* 2.1*   No results for input(s): LIPASE, AMYLASE in the last 168 hours. No results for input(s): AMMONIA in the last 168 hours. Coagulation Profile: Recent Labs  Lab 10/23/20 1558  INR 1.0   Cardiac Enzymes: No results for input(s): CKTOTAL, CKMB, CKMBINDEX, TROPONINI in the last 168 hours. BNP (last 3 results) Recent Labs    06/03/20 1001 09/29/20 1425  PROBNP 100 58.0   HbA1C: No results for input(s): HGBA1C in the last 72 hours. CBG: No results for input(s): GLUCAP in the last 168 hours. Lipid Profile: No results for input(s): CHOL, HDL, LDLCALC, TRIG, CHOLHDL, LDLDIRECT in the last 72 hours. Thyroid Function Tests: No results for input(s): TSH, T4TOTAL, FREET4, T3FREE, THYROIDAB in the last 72 hours. Anemia Panel: Recent Labs    10/23/20 1958  VITAMINB12 1,231*   Urine analysis:    Component Value Date/Time   COLORURINE YELLOW 10/16/2020 2145   APPEARANCEUR CLEAR 10/16/2020 2145   LABSPEC >1.046 (H) 10/16/2020 2145   PHURINE 5.0 10/16/2020 2145   GLUCOSEU NEGATIVE 10/16/2020 2145   GLUCOSEU NEGATIVE 01/03/2020 1434   HGBUR SMALL (A) 10/16/2020 2145   BILIRUBINUR NEGATIVE 10/16/2020 2145   BILIRUBINUR N 01/24/2014 1354   KETONESUR 5 (A) 10/16/2020 2145   PROTEINUR NEGATIVE 10/16/2020 2145   UROBILINOGEN 4.0 (A) 01/03/2020 1434   NITRITE NEGATIVE 10/16/2020 2145   LEUKOCYTESUR NEGATIVE 10/16/2020 2145   Recent Results (from the past 240 hour(s))  Blood Culture (routine x 2)     Status: None (Preliminary result)   Collection Time: 10/23/20  3:50 PM   Specimen: Site Not Specified; Blood  Result Value Ref Range Status   Specimen Description   Final    SITE NOT SPECIFIED Performed at [facility], [place] [address], [place], [place] [address]    Special Requests   Final    BOTTLES DRAWN AEROBIC AND ANAEROBIC Blood Culture results may not be optimal due to an inadequate volume of blood received in culture bottles Performed at [facility] [address], [place], [place] [address]    Culture   Final    NO GROWTH 3 DAYS Performed at [facility], [place] [address] [place], [place] [address]    Report Status PENDING  Incomplete  Blood Culture (routine x 2)     Status: None (Preliminary result)   Collection Time: 10/23/20  4:00 PM  Specimen: BLOOD  Result Value Ref Range Status   Specimen Description   Final    BLOOD SITE NOT SPECIFIED Performed at [facility] [address], [place], [place] [address]    Special Requests   Final    BOTTLES DRAWN AEROBIC AND ANAEROBIC Blood Culture results may not be optimal due to an inadequate volume of blood received in culture bottles Performed at [facility] [address], [place], [place] [address]    Culture   Final    NO GROWTH 3 DAYS Performed at [facility], [place] [address] [place], [place] [address]    Report Status PENDING  Incomplete  Resp Panel by RT-PCR (Flu A&B, Covid) Nasopharyngeal Swab     Status: None   Collection Time: 10/23/20  5:34 PM   Specimen: Nasopharyngeal Swab; Nasopharyngeal(NP) swabs in vial transport medium  Result Value Ref Range Status   SARS Coronavirus 2 by RT PCR NEGATIVE NEGATIVE Final    Comment: (NOTE) SARS-CoV-2 target nucleic acids are NOT DETECTED.  The SARS-CoV-2 RNA is generally detectable in upper respiratory specimens during the acute phase of infection. The lowest concentration of SARS-CoV-2 viral copies this assay can detect is 138 copies/mL. A negative result does not preclude SARS-Cov-2 infection and should not be used as the sole basis for treatment or other patient management decisions. A  negative result may occur with  improper specimen collection/handling, submission of specimen other than nasopharyngeal swab, presence of viral mutation(s) within the areas targeted by this assay, and inadequate number of viral copies(<138 copies/mL). A negative result must be combined with clinical observations, patient history, and epidemiological information. The expected result is Negative.  Fact Sheet for Patients:  EntrepreneurPulse.com.au  Fact Sheet for Healthcare Providers:  IncredibleEmployment.be  This test is no t yet approved or cleared by the [place] FDA and  has been authorized for detection and/or diagnosis of SARS-CoV-2 by FDA under an Emergency Use Authorization (EUA). This EUA will remain  in effect (meaning this test can be used) for the duration of the COVID-19 declaration under Section 564(b)(1) of the Act, 21 U.S.C.section 360bbb-3(b)(1), unless the authorization is terminated  or revoked sooner.       Influenza A by PCR NEGATIVE NEGATIVE Final   Influenza B by PCR NEGATIVE NEGATIVE Final    Comment: (NOTE) The Xpert Xpress SARS-CoV-2/FLU/RSV plus assay is intended as an aid in the diagnosis of influenza from Nasopharyngeal swab specimens and should not be used as a sole basis for treatment. Nasal washings and aspirates are unacceptable for Xpert Xpress SARS-CoV-2/FLU/RSV testing.  Fact Sheet for Patients: EntrepreneurPulse.com.au  Fact Sheet for Healthcare Providers: IncredibleEmployment.be  This test is not yet approved or cleared by the [place] FDA and has been authorized for detection and/or diagnosis of SARS-CoV-2 by FDA under an Emergency Use Authorization (EUA). This EUA will remain in effect (meaning this test can be used) for the duration of the COVID-19 declaration under Section 564(b)(1) of the Act, 21 U.S.C. section 360bbb-3(b)(1), unless the authorization  is terminated or revoked.  Performed at [facility], [place] [address] [place], [place] [address]       Radiology Studies: CT ABDOMEN PELVIS WO CONTRAST  Result Date: 10/24/2020 CLINICAL DATA:  Poor appetite. Fungal infection. Worsening weakness. Abdominal wall cellulitis. EXAM: CT ABDOMEN AND PELVIS WITHOUT CONTRAST TECHNIQUE: Multidetector CT imaging of the abdomen and pelvis was performed following the standard protocol without IV contrast. COMPARISON:  10/16/2020  FINDINGS: Lower chest: Interstitial lung disease at the bases, again consistent with usual interstitial pneumonitis. Mild cardiomegaly with trace bilateral pleural fluid. Multivessel coronary artery atherosclerosis. Hepatobiliary: Normal noncontrast appearance of the liver. In addition to lack of IV contrast, arm position degraded exam. The gallbladder is mildly distended, similar to on the prior. No pericholecystic edema or calcified stone. The common duct measures up to 1.2 cm on 33/4 in the pancreatic head, similar. Pancreas: Not well evaluated.  Grossly within normal limits. Spleen: Normal in size, without focal abnormality. Adrenals/Urinary Tract: Normal adrenal glands. No renal calculi or hydronephrosis. The low pelvis is excluded. No upper bladder stones. Stomach/Bowel: Normal stomach, without wall thickening. Extensive colonic diverticulosis. Normal terminal ileum. The cecum is position within the right deep pelvis. The appendix is likely partially visualized on 65/4 and within normal limits. Normal small bowel caliber. Vascular/Lymphatic: Aortic atherosclerosis. No abdominopelvic adenopathy. Reproductive: Hysterectomy. Other: No abdominal ascites or free intraperitoneal air. Midline anterior abdominal wall defect again identified. Musculoskeletal: Osteopenia. Lumbosacral spondylosis with mild convex left lumbar spine curvature. IMPRESSION: 1. Multifactorial degradation, including lack of IV contrast, arm position,  and exclusion of the low pelvis. 2. Given this limitation, no acute process in the abdomen or pelvis. 3. Interstitial lung disease, consistent with usual interstitial pneumonitis, as before. 4. Trace bilateral pleural fluid. 5. Similar gallbladder distension and common duct dilatation, of indeterminate etiology. Consider correlation with bilirubin levels. 6. Coronary artery atherosclerosis. Aortic Atherosclerosis (ICD10-I70.0). Electronically Signed   By: Abigail Miyamoto M.D.   On: 10/24/2020 17:27   CT HEAD WO CONTRAST  Result Date: 10/24/2020 CLINICAL DATA:  Mental status changes of unknown cause. EXAM: CT HEAD WITHOUT CONTRAST TECHNIQUE: Contiguous axial images were obtained from the base of the skull through the vertex without intravenous contrast. COMPARISON:  01/24/2020 FINDINGS: Brain: There is mild central and cortical atrophy. Periventricular white matter changes are consistent with small vessel disease. There is no intra or extra-axial fluid collection or mass lesion. The basilar cisterns and ventricles have a normal appearance. There is no CT evidence for acute infarction or hemorrhage. Vascular: There is atherosclerotic calcification of the internal carotid arteries. No hyperdense vessels. Skull: Normal. Negative for fracture or focal lesion. Sinuses/Orbits: Air-fluid level again identified within the LEFT sphenoid air cells. Improved aeration of the ethmoid sinuses. Other: None. IMPRESSION: 1. No evidence for acute intracranial abnormality. 2. Atrophy and small vessel disease. 3. Improved aeration of the ethmoid sinuses. Persistent air-fluid level in the LEFT sphenoid air cells. Electronically Signed   By: Nolon Nations M.D.   On: 10/24/2020 17:14    Scheduled Meds: . (feeding supplement) PROSource Plus  30 mL Oral TID BM  . enoxaparin (LOVENOX) injection  30 mg Subcutaneous Q24H  . feeding supplement  1 Container Oral TID BM  . Gerhardt's butt cream   Topical BID  . lipase/protease/amylase   36,000 Units Oral TID AC  . magic mouthwash w/lidocaine  5 mL Oral TID  . mirtazapine  15 mg Oral QHS  . multivitamin with minerals  1 tablet Oral Daily  . predniSONE  5 mg Oral Q breakfast   Continuous Infusions: . fluconazole (DIFLUCAN) IV 100 mg (10/25/20 1539)  . sulfamethoxazole-trimethoprim Stopped (10/26/20 0006)     LOS: 3 days   Time spent: 35 minutes.  Patrecia Pour, MD [facility] [email or web address] 10/26/2020, 11:24 AM

## 2020-10-26 NOTE — Care Management Important Message (Signed)
Important Message  Patient Details IM Letter placed in Patients room. Name: [name] [name] MRN: [id number] Date of Birth: 1930/12/06   Medicare Important Message Given:  Yes     Kerin Salen 10/26/2020, 1:08 PM

## 2020-10-26 NOTE — Telephone Encounter (Signed)
[facility] FYI 10/23/20   Caller states the pt was seen in ED last Friday for pain and bacteria count was elevated. Unable to make her wound care appointment due to pain, home health nurse is concerned that patient is unable to tolerate the antibiotic that is rx. Caregivers would like and order for a direct admission.  Advised to go to ED now. Patient understood and agreed.

## 2020-10-26 NOTE — Progress Notes (Signed)
Patient has attempted to get out of bed multiple times this AM. Bed alarm was on and will remain on. Non skid socks intact. Patient reoriented and provided diversional activity.

## 2020-10-26 NOTE — Consult Note (Signed)
WOC Nurse Consult Note: Reason for Consult: placement of NPWT dressing  Wound type: surgical and old gtube site  Pressure Injury POA: NA Measurement: 1. Distal midline; 7cm x 4cm x 0.5cm  2. Proximal midline; 2cm x 2cm x0.5cm  Wound bed: 1. 50% fibrinous/50% pink 2. 100% pink Drainage (amount, consistency, odor) none at the time of the dressing application  Periwound:skin irritation  related to effluent  Dressing procedure/placement/frequency: Applied silver hydrofiber to areas of skin irritation that are weeping and filled creasing in the abdominal skin with ostomy barrier ring (portion). Protected skin between the two wounds and then filled each defect with black foam, 1pc of black foam used as bridge. Total of 3 pieces of black foam used.  Patient tolerated well. Applied VAC drape over the silver hydrofiber to protect and hopefully regain skin integrity.   Suggested use of mesh underwear to protect dressing from patient pulling the dressing off.   WOC will change M/W/F due to complexity of dressing change and need to reassess periwound skin irritation.   [place], [place], [place]

## 2020-10-26 NOTE — Progress Notes (Signed)
Chaplain engaged in an initial visit with [name] [name] who was trying to rest and experiencing some pain after taking out her IV.  Nurses were attempting to put her IV back in. Chaplain checked in with nurses about how she has been doing.  They noted that [name] does get agitated and will hit others if she is in pain or she becomes very active with getting up or moving.  Chaplain offered support and will follow-up with [name] and her family.    10/26/20 1100  Clinical Encounter Type  Visited With Patient;Health care provider  Visit Type Initial  Referral From Palliative care team  Consult/Referral To Chaplain

## 2020-10-26 NOTE — Progress Notes (Signed)
Pharmacy Antibiotic Note  TAMINA CYPHERS is a 87 y.o. female admitted on 10/23/2020 with severe sepsis secondary to abdominal wound cellulitis, and oropharyngeal candidiasis. Pharmacy has been consulted for Sulfamethoxazole/Trimethoprim and Fluconazole dosing.  Plan: D#4 Antimicrobials  Antimicrobials switched to PO today per MD. Agree with dosing of Sulfamethoxazole/Trimethoprim 800/160mg  1 tablet PO BID and Fluconazole 100mg  PO daily.   Monitor renal function, potassium, cultures, clinical course.   Height: 5' 4.5" (163.8 cm) Weight: 41.2 kg (90 lb 13.3 oz) IBW/kg (Calculated) : 55.85  Temp (24hrs), Avg:97.9 F (36.6 C), Min:97.6 F (36.4 C), Max:98.3 F (36.8 C)  Recent Labs  Lab 10/23/20 1558 10/23/20 1600 10/23/20 1958 10/24/20 0519 10/25/20 1758 10/26/20 0453  WBC 11.4*  --   --  9.5  --   --   CREATININE 0.83  --   --  0.60 0.72 0.68  LATICACIDVEN  --  2.7* 3.4*  --   --   --     Estimated Creatinine Clearance: 31.6 mL/min (by C-G formula based on SCr of 0.68 mg/dL).    Allergies  Allergen Reactions  . Crestor [Rosuvastatin] Other (See Comments)    Muscle pain  . Lactose Intolerance (Gi) Other (See Comments)    Stomach upset  . Penicillins Hives  . Imuran [Azathioprine] Nausea And Vomiting    Antimicrobials this admission: 4/15 Ceftriaxone x 1 4/15 Metronidazole x 1 4/15 Sulfamethoxazole/Trimethoprim >> (4/19) 4/15 Fluconazole >>  Dose adjustments this admission: --  Microbiology results: Previous culture: 4/8 abdomen (Left G tube site); rare MRSA, few Enterobacter cloacae, few Morganella morganii   4/15 BCx: NGTD 4/15 UCx: ordered (not collected) 4/15 Resp panel: COVID negative, Influenza A/B negative   Thank you for allowing pharmacy to be a part of this patient's care.   Lindell Spar, PharmD, BCPS Clinical Pharmacist  10/26/2020 1:10 PM

## 2020-10-26 NOTE — Progress Notes (Signed)
Subjective: Patient seems confused this am which is new for her.  She knows the year and the president, but doesn't know where she is.  She is confused as to why she is in the hospital and doesn't seem to believe me when I tell her why.  She thinks she has a Dr. appt this am with Dr. Lake Bells at [facility].  i'm not sure if she does or not.  She has a tray of food, but it's unclear if she is really eating much.  ROS: See above, otherwise other systems negative  Objective: Vital signs in last 24 hours: Temp:  [97.6 F (36.4 C)-98.3 F (36.8 C)] 97.8 F (36.6 C) (04/18 0544) Pulse Rate:  [73-81] 73 (04/18 0544) Resp:  [16-20] 17 (04/18 0544) BP: (94-101)/(62-64) 101/62 (04/18 0544) SpO2:  [95 %-100 %] 100 % (04/18 0544) Weight:  [41.2 kg] 41.2 kg (04/18 0500) Last BM Date: 10/23/20  Intake/Output from previous day: 04/17 0701 - 04/18 0700 In: 1046 [P.O.:236; IV Piggyback:810] Out: -  Intake/Output this shift: No intake/output data recorded.  PE: Abd: soft, cachetic, midline wound is clean and open.  Suture removed.  Skin with excoriation secondary to drainage from old g-tube site.    Lab Results:  Recent Labs    10/23/20 1558 10/24/20 0519  WBC 11.4* 9.5  HGB 11.9* 12.0  HCT 38.7 38.6  PLT 326 227   BMET Recent Labs    10/25/20 1758 10/26/20 0453  NA 134* 138  K 4.1 4.1  CL 98 99  CO2 28 28  GLUCOSE 128* 76  BUN 11 10  CREATININE 0.72 0.68  CALCIUM 7.9* 8.2*   PT/INR Recent Labs    10/23/20 1558  LABPROT 12.6  INR 1.0   CMP     Component Value Date/Time   NA 138 10/26/2020 0453   NA 138 04/29/2020 0948   K 4.1 10/26/2020 0453   CL 99 10/26/2020 0453   CO2 28 10/26/2020 0453   GLUCOSE 76 10/26/2020 0453   BUN 10 10/26/2020 0453   BUN 11 04/29/2020 0948   CREATININE 0.68 10/26/2020 0453   CREATININE 0.74 11/30/2015 1327   CALCIUM 8.2 (L) 10/26/2020 0453   PROT 5.5 (L) 10/24/2020 0519   PROT 6.5 04/29/2020 0948   ALBUMIN 2.1 (L) 10/24/2020 0519    ALBUMIN 3.8 04/29/2020 0948   AST 17 10/24/2020 0519   ALT 11 10/24/2020 0519   ALKPHOS 106 10/24/2020 0519   BILITOT 0.6 10/24/2020 0519   BILITOT 0.7 04/29/2020 0948   GFRNONAA >60 10/26/2020 0453   GFRAA 69 04/29/2020 0948   Lipase     Component Value Date/Time   LIPASE 24 10/16/2020 1659       Studies/Results: CT ABDOMEN PELVIS WO CONTRAST  Result Date: 10/24/2020 CLINICAL DATA:  Poor appetite. Fungal infection. Worsening weakness. Abdominal wall cellulitis. EXAM: CT ABDOMEN AND PELVIS WITHOUT CONTRAST TECHNIQUE: Multidetector CT imaging of the abdomen and pelvis was performed following the standard protocol without IV contrast. COMPARISON:  10/16/2020 FINDINGS: Lower chest: Interstitial lung disease at the bases, again consistent with usual interstitial pneumonitis. Mild cardiomegaly with trace bilateral pleural fluid. Multivessel coronary artery atherosclerosis. Hepatobiliary: Normal noncontrast appearance of the liver. In addition to lack of IV contrast, arm position degraded exam. The gallbladder is mildly distended, similar to on the prior. No pericholecystic edema or calcified stone. The common duct measures up to 1.2 cm on 33/4 in the pancreatic head, similar. Pancreas: Not well  evaluated.  Grossly within normal limits. Spleen: Normal in size, without focal abnormality. Adrenals/Urinary Tract: Normal adrenal glands. No renal calculi or hydronephrosis. The low pelvis is excluded. No upper bladder stones. Stomach/Bowel: Normal stomach, without wall thickening. Extensive colonic diverticulosis. Normal terminal ileum. The cecum is position within the right deep pelvis. The appendix is likely partially visualized on 65/4 and within normal limits. Normal small bowel caliber. Vascular/Lymphatic: Aortic atherosclerosis. No abdominopelvic adenopathy. Reproductive: Hysterectomy. Other: No abdominal ascites or free intraperitoneal air. Midline anterior abdominal wall defect again identified.  Musculoskeletal: Osteopenia. Lumbosacral spondylosis with mild convex left lumbar spine curvature. IMPRESSION: 1. Multifactorial degradation, including lack of IV contrast, arm position, and exclusion of the low pelvis. 2. Given this limitation, no acute process in the abdomen or pelvis. 3. Interstitial lung disease, consistent with usual interstitial pneumonitis, as before. 4. Trace bilateral pleural fluid. 5. Similar gallbladder distension and common duct dilatation, of indeterminate etiology. Consider correlation with bilirubin levels. 6. Coronary artery atherosclerosis. Aortic Atherosclerosis (ICD10-I70.0). Electronically Signed   By: Abigail Miyamoto M.D.   On: 10/24/2020 17:27   CT HEAD WO CONTRAST  Result Date: 10/24/2020 CLINICAL DATA:  Mental status changes of unknown cause. EXAM: CT HEAD WITHOUT CONTRAST TECHNIQUE: Contiguous axial images were obtained from the base of the skull through the vertex without intravenous contrast. COMPARISON:  01/24/2020 FINDINGS: Brain: There is mild central and cortical atrophy. Periventricular white matter changes are consistent with small vessel disease. There is no intra or extra-axial fluid collection or mass lesion. The basilar cisterns and ventricles have a normal appearance. There is no CT evidence for acute infarction or hemorrhage. Vascular: There is atherosclerotic calcification of the internal carotid arteries. No hyperdense vessels. Skull: Normal. Negative for fracture or focal lesion. Sinuses/Orbits: Air-fluid level again identified within the LEFT sphenoid air cells. Improved aeration of the ethmoid sinuses. Other: None. IMPRESSION: 1. No evidence for acute intracranial abnormality. 2. Atrophy and small vessel disease. 3. Improved aeration of the ethmoid sinuses. Persistent air-fluid level in the LEFT sphenoid air cells. Electronically Signed   By: Nolon Nations M.D.   On: 10/24/2020 17:14    Anti-infectives: Anti-infectives (From admission, onward)    Start     Dose/Rate Route Frequency Ordered Stop   10/24/20 1600  fluconazole (DIFLUCAN) IVPB 100 mg        100 mg 50 mL/hr over 60 Minutes Intravenous Every 24 hours 10/23/20 2009     10/24/20 1000  sulfamethoxazole-trimethoprim (BACTRIM) 160 mg in dextrose 5 % 250 mL IVPB        160 mg 260 mL/hr over 60 Minutes Intravenous Every 12 hours 10/23/20 2025     10/23/20 2100  sulfamethoxazole-trimethoprim (BACTRIM) 160 mg in dextrose 5 % 250 mL IVPB        160 mg 260 mL/hr over 60 Minutes Intravenous  Once 10/23/20 2025 10/23/20 2220   10/23/20 1715  cefTRIAXone (ROCEPHIN) 2 g in sodium chloride 0.9 % 100 mL IVPB        2 g 200 mL/hr over 30 Minutes Intravenous  Once 10/23/20 1704 10/23/20 1805   10/23/20 1715  metroNIDAZOLE (FLAGYL) IVPB 500 mg  Status:  Discontinued        500 mg 100 mL/hr over 60 Minutes Intravenous  Once 10/23/20 1704 10/23/20 1937   10/23/20 1545  fluconazole (DIFLUCAN) tablet 200 mg        200 mg Oral  Once 10/23/20 1541 10/23/20 1608       Assessment/Plan Confusion -  unclear etiology.  No abdominal infection so should not be from that SPCM/FTT - agree with palliative care consult.  9 weeks postop from ex lap with detorsion of SB with placement of g-tube.  Skin excoriation from leaking g-tube site -no infection noted -midline wound is clean.  Will place a VAC today to help this heal.  Ask TOC to start working on home [facility] -WOC to see for [facility] as well as recommendations for drainage. -gerhardt's butt cream ordered for a barrier.  She is supposed to be using Ilex barrier cream at home to help control this. -due to PCM/FTT/severe pulmonary fibrosis/steroids, etc, she is not a surgical candidate to consider closure of the g-tube site surgically. -none of her abdominal wall has signs of infection.  Do not think she needs the nystatin powder, diflucan, or septra for her abdomen. -will continue to follow  FEN - D1 diet  VTE - lovenox ID - diflucan, septra/nystatin  powder   LOS: 3 days    Henreitta Cea , [facility] 10/26/2020, 9:08 AM Please see Amion for pager number during day hours 7:00am-4:30pm or 7:00am -11:30am on weekends

## 2020-10-27 ENCOUNTER — Encounter (HOSPITAL_BASED_OUTPATIENT_CLINIC_OR_DEPARTMENT_OTHER): Payer: Medicare Other | Admitting: Internal Medicine

## 2020-10-27 DIAGNOSIS — Z20822 Contact with and (suspected) exposure to covid-19: Secondary | ICD-10-CM

## 2020-10-27 DIAGNOSIS — I5032 Chronic diastolic (congestive) heart failure: Secondary | ICD-10-CM | POA: Diagnosis not present

## 2020-10-27 DIAGNOSIS — A419 Sepsis, unspecified organism: Secondary | ICD-10-CM | POA: Diagnosis not present

## 2020-10-27 DIAGNOSIS — L03311 Cellulitis of abdominal wall: Secondary | ICD-10-CM | POA: Diagnosis not present

## 2020-10-27 DIAGNOSIS — I2510 Atherosclerotic heart disease of native coronary artery without angina pectoris: Secondary | ICD-10-CM | POA: Diagnosis not present

## 2020-10-27 LAB — BASIC METABOLIC PANEL
Anion gap: 6 (ref 5–15)
BUN: 10 mg/dL (ref 8–23)
CO2: 29 mmol/L (ref 22–32)
Calcium: 8.5 mg/dL — ABNORMAL LOW (ref 8.9–10.3)
Chloride: 104 mmol/L (ref 98–111)
Creatinine, Ser: 0.66 mg/dL (ref 0.44–1.00)
GFR, Estimated: >60 mL/min (ref >60)
Glucose, Bld: 82 mg/dL (ref 70–99)
Potassium: 3.9 mmol/L (ref 3.5–5.1)
Sodium: 139 mmol/L (ref 135–145)

## 2020-10-27 LAB — PHOSPHORUS: Phosphorus: 2.5 mg/dL (ref 2.5–4.6)

## 2020-10-27 LAB — MAGNESIUM: Magnesium: 1.9 mg/dL (ref 1.7–2.4)

## 2020-10-27 MED ORDER — NINTEDANIB ESYLATE 100 MG PO CAPS
100.0000 mg | ORAL_CAPSULE | Freq: Two times a day (BID) | ORAL | Status: DC
  Administered 2020-10-27 – 2020-11-04 (×10): 100 mg via ORAL
  Filled 2020-10-27 (×2): qty 1

## 2020-10-27 MED ORDER — MYCOPHENOLATE MOFETIL 250 MG PO CAPS
1000.0000 mg | ORAL_CAPSULE | Freq: Two times a day (BID) | ORAL | Status: DC
  Administered 2020-10-27 – 2020-11-04 (×11): 1000 mg via ORAL
  Filled 2020-10-27 (×17): qty 4

## 2020-10-27 NOTE — Progress Notes (Signed)
Physical Therapy Treatment Patient Details Name: [name] [name] MRN: [id number] DOB: 18-Nov-1930 Today's Date: 10/27/2020    History of Present Illness Pt is 87 yo female admitted on 10/23/20 with hx of ex lap in Feb 2022, readmission Feb-March with complications, and continued to have issues with poor appetite, odynophagia, symptoms of fungal infection , and worsening weakness.  Pt now admitted with abdominal wall cellulitis (open wound).  Pt with PMH including UIP on 2L O2, chronic prednisone, RA, osteoporosis, HTN, history of DVT/PE, mild CAD, and SBO s/p ex lap February 2022 with G tube placement since removed    PT Comments    Pt requesting to go to the bathroom however too unsteady and weak so provided BSC.  Pt currently mod assist for transfers and presents with posterior lean upon standing requiring assist.  Right foot drop still present today. Recommend SNF upon d/c at this time.   Follow Up Recommendations  SNF     Equipment Recommendations  Wheelchair cushion (measurements PT);Wheelchair (measurements PT)    Recommendations for Other Services       Precautions / Restrictions Precautions Precautions: Fall Precaution Comments: NPWT abdomen    Mobility  Bed Mobility Overal bed mobility: Needs Assistance Bed Mobility: Supine to Sit     Supine to sit: Min assist;HOB elevated     General bed mobility comments: assist for trunk upright    Transfers Overall transfer level: Needs assistance Equipment used: Rolling walker (2 wheeled) Transfers: Sit to/from [facility] Sit to Stand: Mod assist Stand pivot transfers: Mod assist       General transfer comment: Mod A to rise and steady, posterior lean present, wide BOS, able to correct with multimodal cues; pt thought she could ambulate to bathroom however too unsteady so provided BSC, after BSC performed stand pivot to recliner  Ambulation/Gait                 Stairs              Wheelchair Mobility    Modified Rankin (Stroke Patients Only)       Balance   Sitting-balance support: Bilateral upper extremity supported Sitting balance-[name]: Poor Sitting balance - Comments: reliant on UE support Postural control: Posterior lean Standing balance support: Bilateral upper extremity supported Standing balance-[name]: Zero Standing balance comment: reliant on UE support and mod assist for posterior lean                            Cognition Arousal/Alertness: Awake/alert Behavior During Therapy: WFL for tasks assessed/performed Overall Cognitive Status: No family/caregiver present to determine baseline cognitive functioning                                 General Comments: awake/alert and responding appropriately to questions and cues      Exercises      General Comments General comments (skin integrity, edema, etc.): R foot drop still present today      Pertinent Vitals/Pain Pain Assessment: Faces Faces Pain Scale: No hurt Pain Intervention(s): Repositioned;Monitored during session    Home Living                      Prior Function            PT Goals (current goals can now be found in the care plan section) Progress towards  PT goals: Progressing toward goals    Frequency    Min 2X/week      PT Plan Current plan remains appropriate    Co-evaluation              AM-PAC PT "6 Clicks" Mobility   Outcome Measure  Help needed turning from your back to your side while in a flat bed without using bedrails?: A Little Help needed moving from lying on your back to sitting on the side of a flat bed without using bedrails?: A Lot Help needed moving to and from a bed to a chair (including a wheelchair)?: A Lot Help needed standing up from a chair using your arms (e.g., wheelchair or bedside chair)?: A Lot Help needed to walk in hospital room?: A Lot Help needed climbing 3-5 steps with a  railing? : Total 6 Click Score: 12    End of Session Equipment Utilized During Treatment: Gait belt Activity Tolerance: Patient tolerated treatment well Patient left: in chair;with call bell/phone within reach;with chair alarm set (pt aware to use call bell for assist, fall mat placed in front of tray table)   PT Visit Diagnosis: Other abnormalities of gait and mobility (R26.89);Muscle weakness (generalized) (M62.81)     Time: 9774-1423 PT Time Calculation (min) (ACUTE ONLY): 28 min  Charges:  $Therapeutic Activity: 23-37 mins                     Jannette Spanner PT, DPT Acute Rehabilitation Services Pager: [phone] Office: [phone]  York Ram E 10/27/2020, 12:34 PM

## 2020-10-27 NOTE — Progress Notes (Signed)
PROGRESS NOTE  [name] [name]  JOI:[id number] DOB: 31-Jul-1930 DOA: 10/23/2020 PCP: Hoyt Koch, MD  Outpatient Specialists: Dr. Zenia Resides (surgery)  Brief Narrative: [name] [name] is an 87 y.o. 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 She was admitted for a few weeks in February - March 4709 with complications after exploratory laparotomy requiring TPN and G tube placement which was ultimately removed, the patient discharge to SNF for less than 2 weeks, then returned home with 24 hours care. With close surgery follow up she was referred to the wound care center and has been having a wound RN from [facility] performing BID wound dressing changes, though she has continued to struggle for weeks with poor appetite, now odynophagia, symptoms of fungal infection, and over the past few days worsening generalized weakness. A superficial culture taken of the wound showed polymicrobial infection with plans to treat with bactrim though the family stated she was unlikely to be able to swallow the pills as it would upset her stomach.   ED Course: Tachycardic to 104, tachypneic to 21/min, though afebrile, with leukocytosis and elevation of lactic acid to 2.7. CXR showed no acute findings and respiratory status has been stable on home oxygen. Labs appear hemoconcentrated from priors with hyperkalemia (5.3) and hypernatremia (146). Blood cultures were drawn, sepsis diagnosed and IV antibiotics given as well as 30cc/kg IVF. General surgery has evaluated the patient and medicine called for admission.   Hospital Course: Antibiotics have been given, wound vac applied and G tube is planned.  Assessment & Plan: Principal Problem:   Abdominal wall cellulitis Active Problems:   CAD (coronary artery disease)   RA (rheumatoid  arthritis) (HCC)   Chronic respiratory failure with hypoxia (HCC)   Chronic diastolic CHF (congestive heart failure) (HCC)   Iron deficiency anemia   Personal history of PE (pulmonary embolism)   Unintentional weight loss   ILD (interstitial lung disease) (HCC)   Protein-calorie malnutrition, severe   Severe sepsis (HCC)  Severe sepsis: The patient was tachypneic, tachycardic with leukocytosis and increasing drainage from abdominal wound suggestive of cellulitis. Lactic acid 2.7 indicating end organ dysfunction. - Aim to complete antibiotics after 5 days. Based on culture data and hoping to minimize nephrotoxins (e.g. zosyn, vancomycin), will continue bactrim > change to PO now that po intake improving.  - Monitor culture data.  Thrush, mouth pain:  - Continue magic mouthwash, diflucan.  - Mouth pain also related to dental implant screws with irritation. Recommend dentistry follow up as outpatient.  Nonhealing abdominal wounds, MASD:  - Unable to stop prednisone. - Optimize nutrition. Initiate tube feeds once G tube inserted. - Wound care per general surgery > initiate wound vac   - Keep with barrier cream   Dehydration, hypernatremia, severe protein-calorie malnutrition, failure to thrive, generalized weakness: Appears emaciated. Ketonuria without glucosuria.  - Dietitian consulted. Prealbumin is 8. Plan to consult IR for G tube reinsertion and initiate tube feeds once able.  - SLP evaluation > mild aspiration risk, unable to chew so dysphagia 1 diet, thin liquids recommended. No dysphagia noted.  - Remeron for appetite stimulation. - Continue IVF. Last EF preserved, indeterminate diastolic function, and no evidence of volume overload.  - PT, OT, TOC consulted  Acute delirium: Improved. - Delirium precautions, prefer to avoid  sedating agents as much as possible.  Hyperkalemic AKI: Improved.  [name] [name] given with resolution. - IVF - Will need continued monitoring while on  bactrim.  Hypokalemia, hypomagnesemia, hypophosphatemia:  - At risk of refeeding syndrome, continue checking regularly and supplementing as indicated.  Chronic hypoxic respiratory failure due to UIP: No infiltrate on CXR. At baseline. Included portions of lungs on CT abd are stable without acute findings. - Continue 2L O2. - Continue prednisone - Restart cellcept and ofev given low suspicion for severe infection.  Goals of care: These were introduced at admission with my fear that the combination of malnutrition, declining functional status, advanced age, and chronic immunosuppression suggest these wounds may never heal. The patient and family are very clear in their wishes at this time, however. They would like any and all available aggressive interventions with curative intent. This includes prolonged TPN, intensive wound care and physical therapy, IV antibiotics, and hospital monitoring.  - Full code confirmed. - Palliative care following as inpatient. They had an appointment with [facility] as outpatient pending.   Right foot drop: CT head without acute intracranial abnormality. Suspect stretch peroneal nerve palsy.  - PT/OT  Gallbladder distention, CBD dilatation: Stable appearance on CT abd/pelvis 4/16 without inflammatory findings. LFTs wnl and no focal pain overlying this area. No further evaluation currently planned.  DVT prophylaxis: Lovenox Code Status: Full Family Communication: HCPOA Gwen by phone daily. None at bedside this AM.  Disposition Plan:  Status is: Inpatient  Remains inpatient appropriate because:IV treatments appropriate due to intensity of illness or inability to take PO   Dispo: The patient is from: Home              Anticipated d/c is to: TBD              Patient currently is not medically stable to d/c.   Difficult to place patient No  Consultants:   Surgery  Palliative care  Procedures:   None  Antimicrobials:  Ceftriaxone  4/15  Fluconazole 4/15 >>  Bactrim 4/15 4/19  Subjective: Much more alert and back to mental baseline this morning. She is eating some breakfast without dysphagia or odynophagia. Abdominal wounds still hurt and pain is effectively treated with pain medications. Amenable to G tube going forward.   Objective: Vitals:   10/26/20 0544 10/26/20 1406 10/26/20 2043 10/27/20 0422  BP: 101/62 (!) 88/58 (!) 86/58 111/66  Pulse: 73 85 77 73  Resp: 17 14 18 20   Temp: 97.8 F (36.6 C) 98.4 F (36.9 C) 98.6 F (37 C) 98.3 F (36.8 C)  TempSrc:      SpO2: 100% 94% 95% 99%  Weight:      Height:       No intake or output data in the 24 hours ending 10/27/20 0953 Filed Weights   10/24/20 0458 10/25/20 0606 10/26/20 0500  Weight: 42.6 kg 42.1 kg 41.2 kg   Gen: Elderly female in no distress Pulm: Nonlabored breathing 2L O2, no wheezes. Stable mild crackles. CV: Regular rate and rhythm. No murmur, rub, or gallop. No JVD, no dependent edema. GI: Abdomen soft, appropriately tender, non-distended, with normoactive bowel sounds.  Ext: Warm, no deformities, decreased muscle bulk. Skin: Midline abdominal wound with wound vac in place including site of prior G tube. No other new rashes, lesions or ulcers on visualized skin. Neuro: Alert and oriented. No focal neurological deficits. Psych: Judgement and insight appear fair. Mood euthymic & affect congruent. Behavior is appropriate.  Data Reviewed: I have personally reviewed following labs and imaging studies  CBC: Recent Labs  Lab 10/23/20 1558 10/24/20 0519  WBC 11.4* 9.5  NEUTROABS 5.1  --   HGB 11.9* 12.0  HCT 38.7 38.6  MCV 101.0* 99.5  PLT 326 341   Basic Metabolic Panel: Recent Labs  Lab 10/23/20 1558 10/24/20 0519 10/25/20 1758 10/26/20 0453 10/27/20 0535  NA 146* 139 134* 138 139  K 5.3* 3.9 4.1 4.1 3.9  CL 110 106 98 99 104  CO2 26 25 28 28 29   GLUCOSE 92 69* 128* 76 82  BUN 13 9 11 10 10   CREATININE 0.83 0.60 0.72  0.68 0.66  CALCIUM 9.1 8.3* 7.9* 8.2* 8.5*  MG  --  1.5* 1.5* 2.0 1.9  PHOS  --  2.5 1.8* 2.9 2.5   GFR: Estimated Creatinine Clearance: 31.6 mL/min (by C-G formula based on SCr of 0.66 mg/dL). Liver Function Tests: Recent Labs  Lab 10/23/20 1558 10/24/20 0519  AST 32 17  ALT 15 11  ALKPHOS 121 106  BILITOT 0.9 0.6  PROT 7.0 5.5*  ALBUMIN 2.7* 2.1*   No results for input(s): LIPASE, AMYLASE in the last 168 hours. No results for input(s): AMMONIA in the last 168 hours. Coagulation Profile: Recent Labs  Lab 10/23/20 1558  INR 1.0   Cardiac Enzymes: No results for input(s): CKTOTAL, CKMB, CKMBINDEX, TROPONINI in the last 168 hours. BNP (last 3 results) Recent Labs    06/03/20 1001 09/29/20 1425  PROBNP 100 58.0   HbA1C: No results for input(s): HGBA1C in the last 72 hours. CBG: No results for input(s): GLUCAP in the last 168 hours. Lipid Profile: No results for input(s): CHOL, HDL, LDLCALC, TRIG, CHOLHDL, LDLDIRECT in the last 72 hours. Thyroid Function Tests: No results for input(s): TSH, T4TOTAL, FREET4, T3FREE, THYROIDAB in the last 72 hours. Anemia Panel: No results for input(s): VITAMINB12, FOLATE, FERRITIN, TIBC, IRON, RETICCTPCT in the last 72 hours. Urine analysis:    Component Value Date/Time   COLORURINE YELLOW 10/16/2020 2145   APPEARANCEUR CLEAR 10/16/2020 2145   LABSPEC >1.046 (H) 10/16/2020 2145   PHURINE 5.0 10/16/2020 2145   GLUCOSEU NEGATIVE 10/16/2020 2145   GLUCOSEU NEGATIVE 01/03/2020 1434   HGBUR SMALL (A) 10/16/2020 2145   BILIRUBINUR NEGATIVE 10/16/2020 2145   BILIRUBINUR N 01/24/2014 1354   KETONESUR 5 (A) 10/16/2020 2145   PROTEINUR NEGATIVE 10/16/2020 2145   UROBILINOGEN 4.0 (A) 01/03/2020 1434   NITRITE NEGATIVE 10/16/2020 2145   LEUKOCYTESUR NEGATIVE 10/16/2020 2145   Recent Results (from the past 240 hour(s))  Blood Culture (routine x 2)     Status: None (Preliminary result)   Collection Time: 10/23/20  3:50 PM   Specimen:  Site Not Specified; Blood  Result Value Ref Range Status   Specimen Description   Final    SITE NOT SPECIFIED Performed at [facility], [place] [address] [place], [place] [address]    Special Requests   Final    BOTTLES DRAWN AEROBIC AND ANAEROBIC Blood Culture results may not be optimal due to an inadequate volume of blood received in culture bottles Performed at [place] [address], [place], [place] [address]    Culture   Final    NO GROWTH 4 DAYS Performed at [facility], [place] [address], [place], [place] [address]    Report Status PENDING  Incomplete  Blood Culture (routine x 2)     Status: None (Preliminary result)  Collection Time: 10/23/20  4:00 PM   Specimen: BLOOD  Result Value Ref Range Status   Specimen Description   Final    BLOOD SITE NOT SPECIFIED Performed at [place] [address], [place], [place] [address]    Special Requests   Final    BOTTLES DRAWN AEROBIC AND ANAEROBIC Blood Culture results may not be optimal due to an inadequate volume of blood received in culture bottles Performed at [place] [address] [place], [place] [address]    Culture   Final    NO GROWTH 4 DAYS Performed at [facility], [place] [address] [place], [place] [address]    Report Status PENDING  Incomplete  Resp Panel by RT-PCR (Flu A&B, Covid) Nasopharyngeal Swab     Status: None   Collection Time: 10/23/20  5:34 PM   Specimen: Nasopharyngeal Swab; Nasopharyngeal(NP) swabs in vial transport medium  Result Value Ref Range Status   SARS Coronavirus 2 by RT PCR NEGATIVE NEGATIVE Final    Comment: (NOTE) SARS-CoV-2 target nucleic acids are NOT DETECTED.  The SARS-CoV-2 RNA is generally detectable in upper respiratory specimens during the acute phase of infection. The lowest concentration of SARS-CoV-2 viral copies this assay can detect is 138 copies/mL. A negative result does  not preclude SARS-Cov-2 infection and should not be used as the sole basis for treatment or other patient management decisions. A negative result may occur with  improper specimen collection/handling, submission of specimen other than nasopharyngeal swab, presence of viral mutation(s) within the areas targeted by this assay, and inadequate number of viral copies(<138 copies/mL). A negative result must be combined with clinical observations, patient history, and epidemiological information. The expected result is Negative.  Fact Sheet for Patients:  EntrepreneurPulse.com.au  Fact Sheet for Healthcare Providers:  IncredibleEmployment.be  This test is no t yet approved or cleared by the [place] FDA and  has been authorized for detection and/or diagnosis of SARS-CoV-2 by FDA under an Emergency Use Authorization (EUA). This EUA will remain  in effect (meaning this test can be used) for the duration of the COVID-19 declaration under Section 564(b)(1) of the Act, 21 U.S.C.section 360bbb-3(b)(1), unless the authorization is terminated  or revoked sooner.       Influenza A by PCR NEGATIVE NEGATIVE Final   Influenza B by PCR NEGATIVE NEGATIVE Final    Comment: (NOTE) The Xpert Xpress SARS-CoV-2/FLU/RSV plus assay is intended as an aid in the diagnosis of influenza from Nasopharyngeal swab specimens and should not be used as a sole basis for treatment. Nasal washings and aspirates are unacceptable for Xpert Xpress SARS-CoV-2/FLU/RSV testing.  Fact Sheet for Patients: EntrepreneurPulse.com.au  Fact Sheet for Healthcare Providers: IncredibleEmployment.be  This test is not yet approved or cleared by the [place] FDA and has been authorized for detection and/or diagnosis of SARS-CoV-2 by FDA under an Emergency Use Authorization (EUA). This EUA will remain in effect (meaning this test can be used) for the  duration of the COVID-19 declaration under Section 564(b)(1) of the Act, 21 U.S.C. section 360bbb-3(b)(1), unless the authorization is terminated or revoked.  Performed at [facility], [place] [address] [place],  [address]       Radiology Studies: No results found.  Scheduled Meds: . (feeding supplement) PROSource Plus  30 mL Oral TID BM  . enoxaparin (LOVENOX) injection  30 mg Subcutaneous Q24H  . feeding supplement  1 Container Oral TID BM  . fluconazole  100 mg Oral Daily  . Gerhardt's butt cream   Topical BID  . lipase/protease/amylase  36,000 Units Oral TID AC  . magic mouthwash w/lidocaine  5 mL Oral TID  . mirtazapine  15 mg Oral QHS  . multivitamin with minerals  1 tablet Oral Daily  . predniSONE  5 mg Oral Q breakfast  . sulfamethoxazole-trimethoprim  1 tablet Oral Q12H   Continuous Infusions:    LOS: 4 days   Time spent: 35 minutes.  Patrecia Pour, MD [facility] [email or web address] 10/27/2020, 9:53 AM

## 2020-10-27 NOTE — Consult Note (Signed)
   [facility] Inpatient Consult   10/27/2020  JESIKA MEN [birth year] [id number]   Patient chart reviewed for potential [facility] CM) services due to high unplanned readmission risk score. Patient is on the [facility] registry as a benefit of their Medicare plan.  Per review, current recommendation plan is for SNF. No [facility] CM needs.  Netta Cedars, MSN, [facility] Liaison Nurse Mobile Phone [phone]  Toll free office [phone]

## 2020-10-27 NOTE — Progress Notes (Signed)
The patient's HCPOA has reported tested positive for SARS-CoV-2 on 4/19. Last contact with the patient was 4/18. The patient is fully vaccinated (3rd Moderna dose Aug 2021), screened negative at admission 4/15, and remains asymptomatic.  - Pt should wear mask ideally all the time, though with her chronic hypoxic respiratory failure, would recommend at least while caregivers are in the room. Door should remain closed.  - No aerosolizing medications are ordered and no aerosolizing procedures are scheduled.  - No PPE/precautions need to be implemented for staff unless patient becomes symptomatic.  - Of course, no further visitation allowable for HCPOA. - Retest the patient for covid 3 days after last exposure: This would be on 4/21. - These recommendations were discussed with ID, Dr. Graylon Good.   Vance Gather, MD 10/27/2020 2:45 PM

## 2020-10-27 NOTE — NC FL2 (Signed)
[place] LEVEL OF CARE SCREENING TOOL     IDENTIFICATION  Patient Name: [name] [name] Birthdate: 1930-09-15 Sex: female Admission Date (Current Location): 10/23/2020  [facility] and [place] Number:  Herbalist and Address:  [facility],  [place], [place]      Provider Number: [id number]  Attending Physician Name and Address:  Patrecia Pour, MD  Relative Name and Phone Number:  Vinnie Level, sister, [phone]   [phone]    Current Level of Care: Hospital Recommended Level of Care: [place] Prior Approval Number:    Date Approved/Denied:   PASRR Number: [id number]  Discharge Plan: SNF    Current Diagnoses: Patient Active Problem List   Diagnosis Date Noted  . Abdominal wall cellulitis 10/24/2020  . Severe sepsis ([place]) 10/23/2020  . Abdominal pain 10/20/2020  . Diarrhea 10/02/2020  . Pressure injury of skin 09/06/2020  . Acute on chronic respiratory failure with hypoxemia ([place]) 08/23/2020  . Protein-calorie malnutrition, severe 08/20/2020  . Fall 01/25/2020  . Other fatigue 01/03/2020  . Left shoulder pain 01/03/2020  . Urinary frequency 01/03/2020  . Acute otalgia, left 09/06/2019  . Unintentional weight loss 07/18/2019  . Personal history of PE (pulmonary embolism) 04/23/2019  . Headache 02/11/2019  . Iron deficiency anemia 12/21/2018  . Cold sore 10/23/2018  . Blood in stool 09/14/2018  . 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 Therapeutic drug monitoring 07/17/2018  . Chronic diastolic CHF (congestive heart failure) ([place]) 12/19/2017  . Rash 08/11/2017  . Chronic respiratory failure with hypoxia ([place]) 03/30/2017  . Angular cheilitis 03/08/2017  . Leg pain 10/25/2016  . Back pain 08/05/2016  . RA (rheumatoid arthritis) ([place]) 03/31/2015  . Allergic rhinitis   . Varicose vein 09/11/2014  . ILD (interstitial lung disease) ([place]) 06/25/2012  . Chest pain 02/27/2012  . CAD (coronary  artery disease) 02/27/2012  . Pruritus 08/18/2009  . Postinflammatory pulmonary fibrosis / RA ILD  06/30/2008  . Constipation 01/03/2008  . Dyslipidemia 06/16/2007    Orientation RESPIRATION BLADDER Height & Weight     Self,Time,Place  Normal External catheter Weight: 41.2 kg Height:  5' 4.5" (163.8 cm)  BEHAVIORAL SYMPTOMS/MOOD NEUROLOGICAL BOWEL NUTRITION STATUS   (none)  (none) Incontinent Feeding tube  AMBULATORY STATUS COMMUNICATION OF NEEDS Skin   Extensive Assist Verbally Other (Comment) (Pressure Injury, Mid-sacrum;  Wound, Mid abdomen)                       Personal Care Assistance Level of Assistance  Bathing,Feeding,Dressing Bathing Assistance: Maximum assistance Feeding assistance: Limited assistance Dressing Assistance: Limited assistance     Functional Limitations Info  Sight,Hearing,Speech Sight Info: Adequate Hearing Info: Adequate Speech Info: Adequate    SPECIAL CARE FACTORS FREQUENCY  PT (By licensed PT),OT (By licensed OT)     PT Frequency: 5X/W OT Frequency: 5X/W            Contractures Contractures Info: Not present    Additional Factors Info  Code Status,Allergies Code Status Info: Full Allergies Info: Crestor, Lactose intolerence, Penicillins, Imuran           Current Medications (10/27/2020):  This is the current hospital active medication list Current Facility-Administered Medications  Medication Dose Route Frequency Provider Last Rate Last Admin  . (feeding supplement) PROSource Plus liquid 30 mL  30 mL Oral TID BM Patrecia Pour, MD   30 mL at 10/26/20 2006  . acetaminophen (TYLENOL) tablet 650 mg  650 mg Oral Q6H PRN Minda Ditto, RPH       Or  . acetaminophen (TYLENOL) suppository 650 mg  650 mg Rectal Q6H PRN Nyoka Cowden, Terri L, RPH      . benzocaine (ORAJEL) 10 % mucosal gel   Mouth/Throat QID PRN Donalynn Furlong, NP   Given at 10/24/20 1755  . enoxaparin (LOVENOX) injection 30 mg  30 mg Subcutaneous Q24H Patrecia Pour, MD    30 mg at 10/26/20 2152  . feeding supplement (BOOST / RESOURCE BREEZE) liquid 1 Container  1 Container Oral TID BM Patrecia Pour, MD   1 Container at 10/26/20 2005  . fluconazole (DIFLUCAN) tablet 100 mg  100 mg Oral Daily Patrecia Pour, MD   100 mg at 10/26/20 1446  . fluticasone (FLONASE) 50 MCG/ACT nasal spray 2 spray  2 spray Each Nare Daily PRN Patrecia Pour, MD      . Gerhardt's butt cream   Topical BID Saverio Danker, PA-C   Given at 10/26/20 2156  . HYDROmorphone (DILAUDID) injection 0.5-1 mg  0.5-1 mg Intravenous Q4H PRN Patrecia Pour, MD   1 mg at 10/25/20 0019  . lipase/protease/amylase (CREON) capsule 36,000 Units  36,000 Units Oral TID Charna Archer Patrecia Pour, MD   36,000 Units at 10/27/20 [phone]  . magic mouthwash w/lidocaine  5 mL Oral TID Patrecia Pour, MD   5 mL at 10/26/20 2152  . mirtazapine (REMERON SOL-TAB) disintegrating tablet 15 mg  15 mg Oral QHS Patrecia Pour, MD   15 mg at 10/26/20 2152  . multivitamin with minerals tablet 1 tablet  1 tablet Oral Daily Patrecia Pour, MD   1 tablet at 10/26/20 1051  . mycophenolate (CELLCEPT) capsule 1,000 mg  1,000 mg Oral BID Patrecia Pour, MD      . Nintedanib (Ofev) CAPS 100 mg  100 mg Oral BID Minda Ditto, RPH      . ondansetron (ZOFRAN) tablet 4 mg  4 mg Oral Q6H PRN Patrecia Pour, MD       Or  . ondansetron [facility]) injection 4 mg  4 mg Intravenous Q6H PRN Patrecia Pour, MD      . oxyCODONE (Oxy IR/ROXICODONE) immediate release tablet 5-10 mg  5-10 mg Oral Q4H PRN Patrecia Pour, MD   5 mg at 10/26/20 1048  . predniSONE (DELTASONE) tablet 5 mg  5 mg Oral Q breakfast Patrecia Pour, MD   5 mg at 10/27/20 9604  . sulfamethoxazole-trimethoprim (BACTRIM DS) 800-160 MG per tablet 1 tablet  1 tablet Oral Q12H Patrecia Pour, MD   1 tablet at 10/26/20 2152     Discharge Medications: Please see discharge summary for a list of discharge medications.  Relevant Imaging Results:  Relevant Lab Results:   Additional Information SSN [place], [place]

## 2020-10-27 NOTE — Progress Notes (Signed)
Subjective: Patient with no confusion today.  I had a long discussion with the patient regarding her nutritional status.  See below  ROS: See above, otherwise other systems negative  Objective: Vital signs in last 24 hours: Temp:  [98.3 F (36.8 C)-98.6 F (37 C)] 98.3 F (36.8 C) (04/19 0422) Pulse Rate:  [73-85] 73 (04/19 0422) Resp:  [14-20] 20 (04/19 0422) BP: (86-111)/(58-66) 111/66 (04/19 0422) SpO2:  [94 %-99 %] 99 % (04/19 0422) Last BM Date: 10/26/20  Intake/Output from previous day: 04/18 0701 - 04/19 0700 In: -  Out: 400 [Urine:400] Intake/Output this shift: No intake/output data recorded.  PE: Abd: soft, cachetic, midline wound with VAC in place and aquacel over the remaining part of her upper abdomen and g-tube site.     Lab Results:  No results for input(s): WBC, HGB, HCT, PLT in the last 72 hours. BMET Recent Labs    10/26/20 0453 10/27/20 0535  NA 138 139  K 4.1 3.9  CL 99 104  CO2 28 29  GLUCOSE 76 82  BUN 10 10  CREATININE 0.68 0.66  CALCIUM 8.2* 8.5*   PT/INR No results for input(s): LABPROT, INR in the last 72 hours. CMP     Component Value Date/Time   NA 139 10/27/2020 0535   NA 138 04/29/2020 0948   K 3.9 10/27/2020 0535   CL 104 10/27/2020 0535   CO2 29 10/27/2020 0535   GLUCOSE 82 10/27/2020 0535   BUN 10 10/27/2020 0535   BUN 11 04/29/2020 0948   CREATININE 0.66 10/27/2020 0535   CREATININE 0.74 11/30/2015 1327   CALCIUM 8.5 (L) 10/27/2020 0535   PROT 5.5 (L) 10/24/2020 0519   PROT 6.5 04/29/2020 0948   ALBUMIN 2.1 (L) 10/24/2020 0519   ALBUMIN 3.8 04/29/2020 0948   AST 17 10/24/2020 0519   ALT 11 10/24/2020 0519   ALKPHOS 106 10/24/2020 0519   BILITOT 0.6 10/24/2020 0519   BILITOT 0.7 04/29/2020 0948   GFRNONAA >60 10/27/2020 0535   GFRAA 69 04/29/2020 0948   Lipase     Component Value Date/Time   LIPASE 24 10/16/2020 1659       Studies/Results: No results found.  Anti-infectives: Anti-infectives  (From admission, onward)   Start     Dose/Rate Route Frequency Ordered Stop   10/26/20 1300  fluconazole (DIFLUCAN) tablet 100 mg        100 mg Oral Daily 10/26/20 1145     10/26/20 1230  sulfamethoxazole-trimethoprim (BACTRIM DS) 800-160 MG per tablet 1 tablet        1 tablet Oral Every 12 hours 10/26/20 1145 10/28/20 0959   10/24/20 1600  fluconazole (DIFLUCAN) IVPB 100 mg  Status:  Discontinued        100 mg 50 mL/hr over 60 Minutes Intravenous Every 24 hours 10/23/20 2009 10/26/20 1145   10/24/20 1000  sulfamethoxazole-trimethoprim (BACTRIM) 160 mg in dextrose 5 % 250 mL IVPB  Status:  Discontinued        160 mg 260 mL/hr over 60 Minutes Intravenous Every 12 hours 10/23/20 2025 10/26/20 1145   10/23/20 2100  sulfamethoxazole-trimethoprim (BACTRIM) 160 mg in dextrose 5 % 250 mL IVPB        160 mg 260 mL/hr over 60 Minutes Intravenous  Once 10/23/20 2025 10/23/20 2220   10/23/20 1715  cefTRIAXone (ROCEPHIN) 2 g in sodium chloride 0.9 % 100 mL IVPB        2 g 200 mL/hr over  30 Minutes Intravenous  Once 10/23/20 1704 10/23/20 1805   10/23/20 1715  metroNIDAZOLE (FLAGYL) IVPB 500 mg  Status:  Discontinued        500 mg 100 mL/hr over 60 Minutes Intravenous  Once 10/23/20 1704 10/23/20 1937   10/23/20 1545  fluconazole (DIFLUCAN) tablet 200 mg        200 mg Oral  Once 10/23/20 1541 10/23/20 1608       Assessment/Plan Confusion - unclear etiology.  No abdominal infection so should not be from that SPCM/FTT - agree with palliative care consult.  9 weeks postop from ex lap with detorsion of SB with placement of g-tube.  Skin excoriation from leaking g-tube site -no infection noted -midline wound with VAC in place. -gerhardt's butt cream ordered for a barrier.  She is supposed to be using Ilex barrier cream at home to help control this. -due to PCM/FTT/severe pulmonary fibrosis/steroids, etc, she is not a surgical candidate to consider closure of the g-tube site surgically. -none of her  abdominal wall has signs of infection.  Do not think she needs the nystatin powder, diflucan, or septra for her abdomen.  -We discussed that so far she has states her wishes are for aggressive care.  We discussed that if this is the case, the thing that would benefit her the most is likely replacement of her g-tube to supplement her oral intake with TFs.  We discussed that TNA is not ideal and not the best option due to complications from the PICC/TNA such as bacteremia/fungemia/liver failure and it's not the best way to absorb nutrition long-term.  We also discussed a more comfort type approach which would be letting her eat what she wants and if that is not enough then she is ok with that knowing that she is at least comfortable and living out the rest of her life in a comfortable way.  After thinking about it, she stated, "I want to be aggressive for several reason and that's what I choose to do."  She would not tell me those reasons.  She was agreeable to replacement of her g-tube.  I will order for IR to eval her for g-tube replacement  FEN - D1 diet  VTE - lovenox ID - diflucan, septra   LOS: 4 days    Henreitta Cea , [facility] 10/27/2020, 9:17 AM Please see Amion for pager number during day hours 7:00am-4:30pm or 7:00am -11:30am on weekends

## 2020-10-27 NOTE — Plan of Care (Signed)
  Problem: Safety: Goal: Ability to remain free from injury will improve Outcome: Progressing   Problem: Pain Managment: Goal: General experience of comfort will improve Outcome: Progressing   Problem: Nutrition: Goal: Adequate nutrition will be maintained Outcome: Progressing   Problem: Elimination: Goal: Will not experience complications related to bowel motility Outcome: Progressing

## 2020-10-28 ENCOUNTER — Telehealth: Payer: Medicare Other

## 2020-10-28 DIAGNOSIS — A419 Sepsis, unspecified organism: Secondary | ICD-10-CM | POA: Diagnosis not present

## 2020-10-28 DIAGNOSIS — I2510 Atherosclerotic heart disease of native coronary artery without angina pectoris: Secondary | ICD-10-CM | POA: Diagnosis not present

## 2020-10-28 DIAGNOSIS — L03311 Cellulitis of abdominal wall: Secondary | ICD-10-CM | POA: Diagnosis not present

## 2020-10-28 DIAGNOSIS — I5032 Chronic diastolic (congestive) heart failure: Secondary | ICD-10-CM | POA: Diagnosis not present

## 2020-10-28 LAB — CULTURE, BLOOD (ROUTINE X 2)
Culture: NO GROWTH
Culture: NO GROWTH

## 2020-10-28 LAB — CBC WITH DIFFERENTIAL/PLATELET
Abs Immature Granulocytes: 0.07 10*3/uL (ref 0.00–0.07)
Basophils Absolute: 0 10*3/uL (ref 0.0–0.1)
Basophils Relative: 0 %
Eosinophils Absolute: 0 10*3/uL (ref 0.0–0.5)
Eosinophils Relative: 0 %
HCT: 37 % (ref 36.0–46.0)
Hemoglobin: 11.8 g/dL — ABNORMAL LOW (ref 12.0–15.0)
Immature Granulocytes: 1 %
Lymphocytes Relative: 61 %
Lymphs Abs: 7.2 10*3/uL — ABNORMAL HIGH (ref 0.7–4.0)
MCH: 31.5 pg (ref 26.0–34.0)
MCHC: 31.9 g/dL (ref 30.0–36.0)
MCV: 98.7 fL (ref 80.0–100.0)
Monocytes Absolute: 1 10*3/uL (ref 0.1–1.0)
Monocytes Relative: 8 %
Neutro Abs: 3.5 10*3/uL (ref 1.7–7.7)
Neutrophils Relative %: 30 %
Platelets: 225 10*3/uL (ref 150–400)
RBC: 3.75 MIL/uL — ABNORMAL LOW (ref 3.87–5.11)
RDW: 21 % — ABNORMAL HIGH (ref 11.5–15.5)
WBC: 11.9 10*3/uL — ABNORMAL HIGH (ref 4.0–10.5)
nRBC: 0 % (ref 0.0–0.2)

## 2020-10-28 LAB — BASIC METABOLIC PANEL
Anion gap: 9 (ref 5–15)
BUN: 15 mg/dL (ref 8–23)
CO2: 28 mmol/L (ref 22–32)
Calcium: 8.5 mg/dL — ABNORMAL LOW (ref 8.9–10.3)
Chloride: 101 mmol/L (ref 98–111)
Creatinine, Ser: 0.63 mg/dL (ref 0.44–1.00)
GFR, Estimated: >60 mL/min (ref >60)
Glucose, Bld: 83 mg/dL (ref 70–99)
Potassium: 3.9 mmol/L (ref 3.5–5.1)
Sodium: 138 mmol/L (ref 135–145)

## 2020-10-28 LAB — MAGNESIUM
Magnesium: 1.8 mg/dL (ref 1.7–2.4)
Magnesium: 1.7 mg/dL (ref 1.7–2.4)

## 2020-10-28 LAB — PHOSPHORUS
Phosphorus: 2.3 mg/dL — ABNORMAL LOW (ref 2.5–4.6)
Phosphorus: 2.5 mg/dL (ref 2.5–4.6)

## 2020-10-28 MED ORDER — MAGNESIUM SULFATE 2 GM/50ML IV SOLN
2.0000 g | Freq: Once | INTRAVENOUS | Status: AC
  Administered 2020-10-28: 2 g via INTRAVENOUS
  Filled 2020-10-28: qty 50

## 2020-10-28 MED ORDER — DIPHENHYDRAMINE HCL 50 MG/ML IJ SOLN: 12.5000 mg | Freq: Four times a day (QID) | INTRAMUSCULAR | Status: DC | PRN

## 2020-10-28 MED ORDER — DIPHENHYDRAMINE HCL 50 MG/ML IJ SOLN
12.5000 mg | Freq: Once | INTRAMUSCULAR | Status: AC
  Administered 2020-10-28: 12.5 mg via INTRAVENOUS
  Filled 2020-10-28 (×2): qty 1

## 2020-10-28 MED ORDER — FAMOTIDINE IN NACL 20-0.9 MG/50ML-% IV SOLN
20.0000 mg | INTRAVENOUS | Status: DC
  Administered 2020-10-28 – 2020-10-29 (×2): 20 mg via INTRAVENOUS
  Filled 2020-10-28 (×2): qty 50

## 2020-10-28 NOTE — TOC Progression Note (Signed)
Transition of Care [facility]) - Progression Note    Patient Details  Name: [name] [name] MRN: [id number] Date of Birth: 1931-06-07  [facility]) CM/SW [place], [place] Phone Number: 10/28/2020, 2:56 PM  Clinical Narrative:   Patient's first 3 choices of SNF all declined, citing TPN as a barrier. I contacted [facility] to find out if she might be appropriate for care in that setting. TOC will continue to follow during the course of hospitalization.     Expected Discharge Plan: Skilled Nursing Facility Barriers to Discharge: SNF Pending bed offer  Expected Discharge Plan and Services Expected Discharge Plan: [place]   Discharge Planning Services: CM Consult Post Acute Care Choice: [place] Living arrangements for the past 2 months: Single Family Home                                       Social Determinants of Health (SDOH) Interventions    Readmission Risk Interventions No flowsheet data found.

## 2020-10-28 NOTE — Progress Notes (Addendum)
PROGRESS NOTE    [name] [name]  VHQ:[id number] DOB: Jun 10, 1931 DOA: 10/23/2020 PCP: Hoyt Koch, MD  Brief Narrative:  The patient is an 87 year old elderly female with a past medical history significant for but not limited to UIP on 2 L of supplemental oxygen via nasal cannula, chronic prednisone, rheumatoid arthritis, history of DVT and PE, mild CAD, history of SBO status post exploratory lap in February 2022 with G-tube placement who has since had it removed who returned to the ED on 10/23/2020 with worsening abdominal pain and wound drainage.  She was admitted for a few weeks in February and March 4132 for complications after ex lap requiring TPN and G-tube placement which was then ultimately removed.  Patient discharged to SNF for less than 2 weeks and then returned home with 24-hour care with close surgery follow-up.  She was then referred to wound care center and has been having a wound RN from [facility] performing twice daily wound dressings.  She is continued struggle for weeks with poor appetite, and now odynophagia and symptoms of fungal infection last few days she has been worsening with generalized weakness.  A superficial culture was taken of the wound and showed polymicrobial infection with plans to treat with Bactrim though the family stated that she is unlikely to swallow pills as it would upset her stomach.  In the ED she is noted to be tachypneic and tachycardic and found to be septic and given a sepsis protocol 30 cc/kg.  General surgery evaluated and managed and admitted this patient.  Currently she is status post 5 days of antibiotics and wound VAC has been applied to her mid abdominal wound and a G-tube is planned for placement today if possible.  Her appetite is extremely poor.  Palliative care has been called for goals of care discussion however family is clear in her wishes and want everything done currently.  Assessment & Plan:   Principal Problem:   Abdominal wall  cellulitis Active Problems:   CAD (coronary artery disease)   RA (rheumatoid arthritis) (HCC)   Chronic respiratory failure with hypoxia (HCC)   Chronic diastolic CHF (congestive heart failure) (HCC)   Iron deficiency anemia   Personal history of PE (pulmonary embolism)   Unintentional weight loss   ILD (interstitial lung disease) (HCC)   Protein-calorie malnutrition, severe   Severe sepsis (HCC)  Severe Sepsis, poA:  -The patient was tachypneic, tachycardic with leukocytosis and increasing drainage from abdominal wound suggestive of cellulitis. Lactic acid 2.7 indicating end organ dysfunction. -Aim to complete antibiotics after 5 days. Based on culture data and hoping to minimize nephrotoxins (e.g. zosyn, vancomycin), will continue bactrim > changed to PO now that po intake improving. Bactrim is now complte -Continuing to  Monitor culture data. -She is Afebrile but WBC went up and is now gone from 9.5 -> 11.9  Thrush, mouth pain:  -Continue magic mouthwash, diflucan.  -C/w Benzocaine Mouth/Throat Pain 4 Times Daily prN -Mouth pain also related to dental implant screws with irritation.  -Recommend dentistry follow up as outpatient.  Nonhealing abdominal wounds, MASD:  -Unable to stop prednisone. Remains on 5 mg po Daily with Breakfast  -Optimize nutrition. Initiate tube feeds if and once G tube inserted. - Wound care per general surgery > initiate wound vac   - Keep with barrier cream   Dehydration Severe protein-calorie malnutrition, failure to thrive, generalized weakness:  -Appears emaciated. Ketonuria without glucosuria.  -Dietitian consulted. Prealbumin is 8. Plan to consult IR for G  tube reinsertion and initiate tube feeds once able and hopefully done today through the old G-Tube site.  Per surgery if they are unable to place it to the side they will defer further placement as they do not want another hole as she will not heal this either due to her poor healing abilities as  she is no longer surgical candidate given her failure to thrive and severe pulmonary fibrosis  -SLP evaluation > mild aspiration risk, unable to chew so dysphagia 1 diet, thin liquids recommended. No dysphagia noted.  -C/w Creon 36,000 units po TID before meals  -C/w with Mirtazepine 15 gmg po qHS for appetite stimulation. -Continued IVF and now stopped. Last EF preserved, indeterminate diastolic function, and no evidence of volume overload.  -C/w Prosource Pluse 30 mL po TID and Boost Breeze 1 Contatiner po TID between meals; Per Dietitian "if IR able to place PEG, recommend Osmolite 1.2 @ 20 ml/hr to advance by 10 ml every 24 hours to reach goal rate of 50 ml/hr with 45 ml Prosource TF once/day and 100 ml free water QID." -PT, OT, TOC consulted -Palliative Consulted for GOC Discussion  Acute Delirium -Improved. -C/w Delirium precautions, prefer to avoid sedating agents as much as possible.  Hyperkalemia AKI -Improved.  -Lokelma given with resolution. -Initiated on IVF but this has now been discontinued -Patient's potassium this morning 3.9  -Patient's BUN/Creatinine is now 15/0.63 -Will need continued monitoring while on bactrim. -Avoid nephrotoxic medications, contrast dyes, hypotension and renally dose medications -Repeat CMP in the AM   Hypokalemia, hypomagnesemia, hypophosphatemia:  -At risk of refeeding syndrome, continue checking regularly and supplementing as indicated. -Her K+ is now 3.9, mag level is 1.7 and will be replete with IV mag sulfate 2 g today, and phosphorus level was 2.5 -Continue monitor and trend and replete as necessary -Repeat CMP in a.m.  Chronic Hypoxic Respiratory Failure due to UIP:  -No infiltrate on CXR. At baseline. Included portions of lungs on CT abd are stable without acute findings. - Continue 2L O2. -SpO2: 93 % O2 Flow Rate (L/min): 2 L/min -Continue prednisone -Restarted Mycophenolate 1000 mg po BID and Nintendanib on 10/27/20 given low  suspicion for severe infection.  Goals of Care -These were introduced at admission; Given the combination of malnutrition, declining functional status, advanced age, and chronic immunosuppression suggest these wounds may never heal. The patient and family are very clear in their wishes at this time, however. They would like any and all available aggressive interventions with curative intent. This includes prolonged TPN, intensive wound care and physical therapy, IV antibiotics, and hospital monitoring.  -Full code confirmed. -Palliative care following as inpatient and Dr. Domingo Cocking to weigh in tomorrow. They had an appointment with [facility] as outpatient pending.  Right Foot Drop:  -CT head without acute intracranial abnormality. Suspect stretch peroneal nerve palsy.  -PT/OT recommending SNF  Gallbladder distention, CBD Dilatation:  -Stable appearance on CT abd/pelvis 4/16 without inflammatory findings. LFTs wnl and no focal pain overlying this area. No further evaluation currently planned. -Follow up as an outpatient   COVID-19 Exposure -Per Dr. Chauncey Reading reported tested postive SARS-CoV-2 on 4/19 and last contact with the patient was 4/18 -Patient is Fully Vaccinated and Screend Negative on 4/15/ -ID recommendations per Dr. Baxter Flattery:   "Pt should wear mask ideally all the time, though with her chronic hypoxic respiratory failure, would recommend at least while caregivers are in the room. Door should remain closed.  - No aerosolizing medications are ordered and no  aerosolizing procedures are scheduled.  - No PPE/precautions need to be implemented for staff unless patient becomes symptomatic.  - Of course, no further visitation allowable for HCPOA. - Retest the patient for covid 3 days after last exposure: This would be on 4/21." -Will retest COVID-19 on 10/29/20  DVT prophylaxis: Enoxaparin 30 mg sq q24h Code Status: FULL CODE  Family Communication: I called [facility] at 3:07 pm  and got a Advertising account executive. No family present at bedside  Disposition Plan: Pending further clinical improvement and anticipate discharging to skilled nursing facility  Status is: Inpatient  Remains inpatient appropriate because:Unsafe d/c plan, IV treatments appropriate due to intensity of illness or inability to take PO and Inpatient level of care appropriate due to severity of illness   Dispo: The patient is from: Home              Anticipated d/c is to: SNF              Patient currently is not medically stable to d/c.   Difficult to place patient No  Consultants:   General Surgery  Palliative Care  Interventional Radiology    Procedures:   Antimicrobials:  Anti-infectives (From admission, onward)   Start     Dose/Rate Route Frequency Ordered Stop   10/26/20 1300  fluconazole (DIFLUCAN) tablet 100 mg        100 mg Oral Daily 10/26/20 1145     10/26/20 1230  sulfamethoxazole-trimethoprim (BACTRIM DS) 800-160 MG per tablet 1 tablet        1 tablet Oral Every 12 hours 10/26/20 1145 10/27/20 2219   10/24/20 1600  fluconazole (DIFLUCAN) IVPB 100 mg  Status:  Discontinued        100 mg 50 mL/hr over 60 Minutes Intravenous Every 24 hours 10/23/20 2009 10/26/20 1145   10/24/20 1000  sulfamethoxazole-trimethoprim (BACTRIM) 160 mg in dextrose 5 % 250 mL IVPB  Status:  Discontinued        160 mg 260 mL/hr over 60 Minutes Intravenous Every 12 hours 10/23/20 2025 10/26/20 1145   10/23/20 2100  sulfamethoxazole-trimethoprim (BACTRIM) 160 mg in dextrose 5 % 250 mL IVPB        160 mg 260 mL/hr over 60 Minutes Intravenous  Once 10/23/20 2025 10/23/20 2220   10/23/20 1715  cefTRIAXone (ROCEPHIN) 2 g in sodium chloride 0.9 % 100 mL IVPB        2 g 200 mL/hr over 30 Minutes Intravenous  Once 10/23/20 1704 10/23/20 1805   10/23/20 1715  metroNIDAZOLE (FLAGYL) IVPB 500 mg  Status:  Discontinued        500 mg 100 mL/hr over 60 Minutes Intravenous  Once 10/23/20 1704 10/23/20 1937   10/23/20 1545   fluconazole (DIFLUCAN) tablet 200 mg        200 mg Oral  Once 10/23/20 1541 10/23/20 1608        Subjective: Seen and examined at bedside and she was complaining of some mouth and throat pain.  No nausea or vomiting.  Denies any abdominal pain currently.  Denies any shortness of breath.  Awaiting G-tube placement.  No other concerns or complaints at this time.  Objective: Vitals:   10/28/20 0033 10/28/20 0403 10/28/20 0500 10/28/20 1259  BP: 105/65 118/73  105/61  Pulse: 85 94  84  Resp:  18  14  Temp:  98.3 F (36.8 C)  98.3 F (36.8 C)  TempSrc:  Oral  Oral  SpO2:  94%  93%  Weight:   40.5 kg   Height:        Intake/Output Summary (Last 24 hours) at 10/28/2020 1440 Last data filed at 10/28/2020 0600 Gross per 24 hour  Intake 100 ml  Output --  Net 100 ml   Filed Weights   10/25/20 0606 10/26/20 0500 10/28/20 0500  Weight: 42.1 kg 41.2 kg 40.5 kg   Examination: Physical Exam:  Constitutional: Extremely thin and cachectic elderly female in mild distress complaining of mouth pain Eyes: Lids and conjunctivae normal, sclerae anicteric  ENMT: External Ears, Nose appear normal. Grossly normal hearing.  Neck: Appears normal, supple, no cervical masses, normal ROM, no appreciable thyromegaly; no JVD Respiratory: Diminished to auscultation bilaterally with coarse breath sounds, no wheezing, rales, rhonchi or crackles. Normal respiratory effort and patient is not tachypenic. No accessory muscle use.  Has unlabored breathing but is wearing 2 L of oxygen via nasal cannula Cardiovascular: RRR, no murmurs / rubs / gallops. S1 and S2 auscultated. No extremity edema Abdomen: Soft, non-tender, non-distended. Bowel sounds positive x4.  Has a midline abdominal wound VAC placement GU: Deferred. Musculoskeletal: No clubbing / cyanosis of digits/nails. No joint deformity upper and lower extremities.  Skin: No rashes, lesions, ulcers. No induration; Warm and dry.  Neurologic: CN 2-12 grossly  intact with no focal deficits. Romberg sign and cerebellar reflexes not assessed.  Psychiatric: Normal judgment and insight. Alert and oriented x 3. Anxious mood and appropriate affect.   Data Reviewed: I have personally reviewed following labs and imaging studies  CBC: Recent Labs  Lab 10/23/20 1558 10/24/20 0519 10/28/20 0852  WBC 11.4* 9.5 11.9*  NEUTROABS 5.1  --  3.5  HGB 11.9* 12.0 11.8*  HCT 38.7 38.6 37.0  MCV 101.0* 99.5 98.7  PLT 326 227 268   Basic Metabolic Panel: Recent Labs  Lab 10/24/20 0519 10/25/20 1758 10/26/20 0453 10/27/20 0535 10/28/20 0458 10/28/20 0852  NA 139 134* 138 139 138  --   K 3.9 4.1 4.1 3.9 3.9  --   CL 106 98 99 104 101  --   CO2 25 28 28 29 28   --   GLUCOSE 69* 128* 76 82 83  --   BUN 9 11 10 10 15   --   CREATININE 0.60 0.72 0.68 0.66 0.63  --   CALCIUM 8.3* 7.9* 8.2* 8.5* 8.5*  --   MG 1.5* 1.5* 2.0 1.9 1.8 1.7  PHOS 2.5 1.8* 2.9 2.5 2.3* 2.5   GFR: Estimated Creatinine Clearance: 31.1 mL/min (by C-G formula based on SCr of 0.63 mg/dL). Liver Function Tests: Recent Labs  Lab 10/23/20 1558 10/24/20 0519  AST 32 17  ALT 15 11  ALKPHOS 121 106  BILITOT 0.9 0.6  PROT 7.0 5.5*  ALBUMIN 2.7* 2.1*   No results for input(s): LIPASE, AMYLASE in the last 168 hours. No results for input(s): AMMONIA in the last 168 hours. Coagulation Profile: Recent Labs  Lab 10/23/20 1558  INR 1.0   Cardiac Enzymes: No results for input(s): CKTOTAL, CKMB, CKMBINDEX, TROPONINI in the last 168 hours. BNP (last 3 results) Recent Labs    06/03/20 1001 09/29/20 1425  PROBNP 100 58.0   HbA1C: No results for input(s): HGBA1C in the last 72 hours. CBG: No results for input(s): GLUCAP in the last 168 hours. Lipid Profile: No results for input(s): CHOL, HDL, LDLCALC, TRIG, CHOLHDL, LDLDIRECT in the last 72 hours. Thyroid Function Tests: No results for input(s): TSH, T4TOTAL, FREET4, T3FREE, THYROIDAB in the last 72  hours. Anemia Panel: No  results for input(s): VITAMINB12, FOLATE, FERRITIN, TIBC, IRON, RETICCTPCT in the last 72 hours. Sepsis Labs: Recent Labs  Lab 10/23/20 1600 10/23/20 1958  LATICACIDVEN 2.7* 3.4*    Recent Results (from the past 240 hour(s))  Blood Culture (routine x 2)     Status: None   Collection Time: 10/23/20  3:50 PM   Specimen: Site Not Specified; Blood  Result Value Ref Range Status   Specimen Description   Final    SITE NOT SPECIFIED Performed at [facility] [address], [place], [place] [address]    Special Requests   Final    BOTTLES DRAWN AEROBIC AND ANAEROBIC Blood Culture results may not be optimal due to an inadequate volume of blood received in culture bottles Performed at [facility] [address], [place], [place] [address]    Culture   Final    NO GROWTH 5 DAYS Performed at [facility], [place] [address] [place], [place] [address]    Report Status 10/28/2020 FINAL  Final  Blood Culture (routine x 2)     Status: None   Collection Time: 10/23/20  4:00 PM   Specimen: BLOOD  Result Value Ref Range Status   Specimen Description   Final    BLOOD SITE NOT SPECIFIED Performed at [facility] [address], [place], [place] [address]    Special Requests   Final    BOTTLES DRAWN AEROBIC AND ANAEROBIC Blood Culture results may not be optimal due to an inadequate volume of blood received in culture bottles Performed at [facility] [address] [name], [place] [address]    Culture   Final    NO GROWTH 5 DAYS Performed at [facility], [place] [address], [place], [place] [address]    Report Status 10/28/2020 FINAL  Final  Resp Panel by RT-PCR (Flu A&B, Covid) Nasopharyngeal Swab     Status: None   Collection Time: 10/23/20  5:34 PM   Specimen: Nasopharyngeal Swab; Nasopharyngeal(NP) swabs in vial transport medium  Result Value Ref Range Status   SARS Coronavirus 2 by RT PCR NEGATIVE  NEGATIVE Final    Comment: (NOTE) SARS-CoV-2 target nucleic acids are NOT DETECTED.  The SARS-CoV-2 RNA is generally detectable in upper respiratory specimens during the acute phase of infection. The lowest concentration of SARS-CoV-2 viral copies this assay can detect is 138 copies/mL. A negative result does not preclude SARS-Cov-2 infection and should not be used as the sole basis for treatment or other patient management decisions. A negative result may occur with  improper specimen collection/handling, submission of specimen other than nasopharyngeal swab, presence of viral mutation(s) within the areas targeted by this assay, and inadequate number of viral copies(<138 copies/mL). A negative result must be combined with clinical observations, patient history, and epidemiological information. The expected result is Negative.  Fact Sheet for Patients:  EntrepreneurPulse.com.au  Fact Sheet for Healthcare Providers:  IncredibleEmployment.be  This test is no t yet approved or cleared by the [place] FDA and  has been authorized for detection and/or diagnosis of SARS-CoV-2 by FDA under an Emergency Use Authorization (EUA). This EUA will remain  in effect (meaning this test can be used) for the duration of the COVID-19 declaration under Section 564(b)(1) of the Act, 21 U.S.C.section 360bbb-3(b)(1), unless the authorization is terminated  or revoked sooner.       Influenza A by PCR NEGATIVE NEGATIVE Final  Influenza B by PCR NEGATIVE NEGATIVE Final    Comment: (NOTE) The Xpert Xpress SARS-CoV-2/FLU/RSV plus assay is intended as an aid in the diagnosis of influenza from Nasopharyngeal swab specimens and should not be used as a sole basis for treatment. Nasal washings and aspirates are unacceptable for Xpert Xpress SARS-CoV-2/FLU/RSV testing.  Fact Sheet for Patients: EntrepreneurPulse.com.au  Fact Sheet for Healthcare  Providers: IncredibleEmployment.be  This test is not yet approved or cleared by the [place] FDA and has been authorized for detection and/or diagnosis of SARS-CoV-2 by FDA under an Emergency Use Authorization (EUA). This EUA will remain in effect (meaning this test can be used) for the duration of the COVID-19 declaration under Section 564(b)(1) of the Act, 21 U.S.C. section 360bbb-3(b)(1), unless the authorization is terminated or revoked.  Performed at [facility], [place] [address] [place], [place] [address]    RN Pressure Injury Documentation: Pressure Injury 09/05/20 Sacrum Mid Stage 2 -  Partial thickness loss of dermis presenting as a shallow open injury with a red, pink wound bed without slough. (Active)  09/05/20 2150  Location: Sacrum  Location Orientation: Mid  Staging: Stage 2 -  Partial thickness loss of dermis presenting as a shallow open injury with a red, pink wound bed without slough.  Wound Description (Comments):   Present on Admission:      Pressure Injury 09/05/20 Sacrum Left Stage 2 -  Partial thickness loss of dermis presenting as a shallow open injury with a red, pink wound bed without slough. (Active)  09/05/20 2150  Location: Sacrum  Location Orientation: Left  Staging: Stage 2 -  Partial thickness loss of dermis presenting as a shallow open injury with a red, pink wound bed without slough.  Wound Description (Comments):   Present on Admission:    Estimated body mass index is 15.09 kg/m as calculated from the following:   Height as of this encounter: 5' 4.5" (1.638 m).   Weight as of this encounter: 40.5 kg.  Malnutrition Type:  Nutrition Problem: Severe Malnutrition Etiology: chronic illness  Malnutrition Characteristics:  Signs/Symptoms: severe fat depletion,severe muscle depletion,percent weight loss Percent weight loss: 13.5 %  Nutrition Interventions:  Interventions: Tube feeding   Radiology  Studies: No results found.  Scheduled Meds: . (feeding supplement) PROSource Plus  30 mL Oral TID BM  . enoxaparin (LOVENOX) injection  30 mg Subcutaneous Q24H  . feeding supplement  1 Container Oral TID BM  . fluconazole  100 mg Oral Daily  . Gerhardt's butt cream   Topical BID  . lipase/protease/amylase  36,000 Units Oral TID AC  . magic mouthwash w/lidocaine  5 mL Oral TID  . mirtazapine  15 mg Oral QHS  . multivitamin with minerals  1 tablet Oral Daily  . mycophenolate  1,000 mg Oral BID  . Nintedanib  100 mg Oral BID  . predniSONE  5 mg Oral Q breakfast   Continuous Infusions:   LOS: 5 days   Kerney Elbe, DO [facility] PAGER is on [place]  If 7PM-7AM, please contact night-coverage [email or web address]

## 2020-10-28 NOTE — Progress Notes (Signed)
Nutrition Follow-up  DOCUMENTATION CODES:   Severe malnutrition in context of chronic illness,Underweight  INTERVENTION:  - if IR able to place PEG, recommend Osmolite 1.2 @ 20 ml/hr to advance by 10 ml every 24 hours to reach goal rate of 50 ml/hr with 45 ml Prosource TF once/day and 100 ml free water QID. - at goal rate, this regimen will provide 1480 kcal, 78 grams protein, 1384 ml free water.  - diet re-advancement as medically feasible.  -will d/c Calorie Count for patient who is NPO.   NUTRITION DIAGNOSIS:   Severe Malnutrition related to chronic illness as evidenced by severe fat depletion,severe muscle depletion,percent weight loss. -ongoing  GOAL:   Patient will meet greater than or equal to 90% of their needs -unmet, unable to meet at this time.   MONITOR:   Diet advancement,TF tolerance,Labs,Weight trends  REASON FOR ASSESSMENT:   Consult Enteral/tube feeding initiation and management,Calorie Count  ASSESSMENT:   Pt admitted with severe sepsis 2/2 abdominal wall cellulitis. PMH includes UIP on 2L O2, chronic prednisone use, RA, h/o DVT/PE, CAD, and SBO s/p ex lap Feb 2022 requiring TPN and G tube placement (has since been removed).  Pt is ~ 3 months s/p ex lap for small bowel torsion with placement of G-tube. This has since been removed, but pt has had a moderate amount of drainage from the open abdominal wound/old G-tube site. This has led to significant skin breakdown and abdominal pain. A superficial culture of the wound showed polymicrobial infection, so IV bactrim was initiated. WOC following.   Palliative Care saw patient on 4/16 and plan is for continuing Full Code status.   Patient laying in bed with no family or visitors present. Patient reports pain to inside of R cheek; RN is aware and has provided orajel and pain medications.  Diet was advanced from NPO to Dysphagia 1, thin liquids on 4/16 at 1235 and was changed back to NPO at midnight last night. The  only documented meal intakes were 50% of breakfast on 4/17 and 0% of dinner last night.   Weight has been stable throughout hospitalization.   Per notes: - not a candidate for surgical closure of the G-tube site - IR to attempt replacement of PEG in old G-tube site, but if unable, a new hole will not be created   Labs reviewed; Ca: 8.5 mg/dl. Medications reviewed; 100 mg diflucan/day, 36000 units creon TID, 5 ml magic mouthwash TID, 1 tablet multivitamin with minerals/day,5 mg deltasone/day.    NUTRITION - FOCUSED PHYSICAL EXAM:  Flowsheet Row Most Recent Value  Orbital Region Severe depletion  Upper Arm Region Severe depletion  Thoracic and Lumbar Region Severe depletion  Buccal Region Severe depletion  Temple Region Severe depletion  Clavicle Bone Region Severe depletion  Clavicle and Acromion Bone Region Severe depletion  Scapular Bone Region Unable to assess  Dorsal Hand Severe depletion  Patellar Region Severe depletion  Anterior Thigh Region Unable to assess  Posterior Calf Region Severe depletion  Edema (RD Assessment) None  Hair Reviewed  Eyes Reviewed  Mouth Reviewed  Skin Reviewed  Nails Reviewed       Diet Order:   Diet Order            Diet NPO time specified  Diet effective midnight                 EDUCATION NEEDS:   Not appropriate for education at this time  Skin:  Skin Assessment: Skin Integrity Issues: Skin  Integrity Issues:: Stage II,Incisions Stage II: sacrum Incisions: abdomen (4/16 and 4/18)  Last BM:  4/19 (type 6 x1)  Height:   Ht Readings from Last 1 Encounters:  10/24/20 5' 4.5" (1.638 m)    Weight:   Wt Readings from Last 1 Encounters:  10/28/20 40.5 kg     Estimated Nutritional Needs:  Kcal:  1400-1600 Protein:  70-80 grams Fluid:  >1.4L     Jarome Matin, MS, RD, LDN, CNSC Inpatient Clinical Dietitian RD pager # available in AMION  After hours/weekend pager # available in [facility]

## 2020-10-28 NOTE — Consult Note (Signed)
[facility] Nurse wound follow up Wound type: surgical and old Gtube site Measurement: see previous note Wound bed: 2 open areas along the midline, both 100% clean; better granulation tissue today Drainage (amount, consistency, odor) none in VAC canister Periwound: old gtube site has denudation that extends 3.5cm circumferentially, attempted to dry area with silver hydrofiber with last dressing, area is still moist and macerated. Gtube site is in skin fold from laxity of the skin, patient is literally skin and bones. Site has minimal gastric leakage with dressing change  Dressing procedure/placement/frequency: Removed old NPWT dressing Cleansed wound with normal saline Periwound skin protected with skin barrier wipe Skin bridge between open wounds on the midline protected with VAC drape.  Filled wound with  _2__ piece of black foam Sealed NPWT dressing at 162mm HG Patient received IV pain medication per bedside nurse prior to dressing change Patient tolerated procedure well.  Covered Gtube site with silicone foam to manage drainage, patient to have new Gtube placed today in IR.    [place] nurse will continue to provide NPWT dressing changed due to the complexity of the dressing change.    [place], [place], [place] :

## 2020-10-28 NOTE — Progress Notes (Signed)
Subjective: Patient continues to complain of severe pain secondary to her dental implant in her mouth.  Otherwise has no other complaints  ROS: See above, otherwise other systems negative  Objective: Vital signs in last 24 hours: Temp:  [98 F (36.7 C)-98.7 F (37.1 C)] 98.3 F (36.8 C) (04/20 0403) Pulse Rate:  [80-94] 94 (04/20 0403) Resp:  [14-18] 18 (04/20 0403) BP: (89-118)/(54-73) 118/73 (04/20 0403) SpO2:  [94 %-99 %] 94 % (04/20 0403) Weight:  [40.5 kg] 40.5 kg (04/20 0500) Last BM Date: 10/27/20  Intake/Output from previous day: 04/19 0701 - 04/20 0700 In: 100 [P.O.:100] Out: -  Intake/Output this shift: No intake/output data recorded.  PE: Abd: soft, cachetic, midline wound with VAC in place and aquacel over the remaining part of her upper abdomen and g-tube site.     Lab Results:  No results for input(s): WBC, HGB, HCT, PLT in the last 72 hours. BMET Recent Labs    10/27/20 0535 10/28/20 0458  NA 139 138  K 3.9 3.9  CL 104 101  CO2 29 28  GLUCOSE 82 83  BUN 10 15  CREATININE 0.66 0.63  CALCIUM 8.5* 8.5*   PT/INR No results for input(s): LABPROT, INR in the last 72 hours. CMP     Component Value Date/Time   NA 138 10/28/2020 0458   NA 138 04/29/2020 0948   K 3.9 10/28/2020 0458   CL 101 10/28/2020 0458   CO2 28 10/28/2020 0458   GLUCOSE 83 10/28/2020 0458   BUN 15 10/28/2020 0458   BUN 11 04/29/2020 0948   CREATININE 0.63 10/28/2020 0458   CREATININE 0.74 11/30/2015 1327   CALCIUM 8.5 (L) 10/28/2020 0458   PROT 5.5 (L) 10/24/2020 0519   PROT 6.5 04/29/2020 0948   ALBUMIN 2.1 (L) 10/24/2020 0519   ALBUMIN 3.8 04/29/2020 0948   AST 17 10/24/2020 0519   ALT 11 10/24/2020 0519   ALKPHOS 106 10/24/2020 0519   BILITOT 0.6 10/24/2020 0519   BILITOT 0.7 04/29/2020 0948   GFRNONAA >60 10/28/2020 0458   GFRAA 69 04/29/2020 0948   Lipase     Component Value Date/Time   LIPASE 24 10/16/2020 1659       Studies/Results: No  results found.  Anti-infectives: Anti-infectives (From admission, onward)   Start     Dose/Rate Route Frequency Ordered Stop   10/26/20 1300  fluconazole (DIFLUCAN) tablet 100 mg        100 mg Oral Daily 10/26/20 1145     10/26/20 1230  sulfamethoxazole-trimethoprim (BACTRIM DS) 800-160 MG per tablet 1 tablet        1 tablet Oral Every 12 hours 10/26/20 1145 10/27/20 2219   10/24/20 1600  fluconazole (DIFLUCAN) IVPB 100 mg  Status:  Discontinued        100 mg 50 mL/hr over 60 Minutes Intravenous Every 24 hours 10/23/20 2009 10/26/20 1145   10/24/20 1000  sulfamethoxazole-trimethoprim (BACTRIM) 160 mg in dextrose 5 % 250 mL IVPB  Status:  Discontinued        160 mg 260 mL/hr over 60 Minutes Intravenous Every 12 hours 10/23/20 2025 10/26/20 1145   10/23/20 2100  sulfamethoxazole-trimethoprim (BACTRIM) 160 mg in dextrose 5 % 250 mL IVPB        160 mg 260 mL/hr over 60 Minutes Intravenous  Once 10/23/20 2025 10/23/20 2220   10/23/20 1715  cefTRIAXone (ROCEPHIN) 2 g in sodium chloride 0.9 % 100 mL IVPB  2 g 200 mL/hr over 30 Minutes Intravenous  Once 10/23/20 1704 10/23/20 1805   10/23/20 1715  metroNIDAZOLE (FLAGYL) IVPB 500 mg  Status:  Discontinued        500 mg 100 mL/hr over 60 Minutes Intravenous  Once 10/23/20 1704 10/23/20 1937   10/23/20 1545  fluconazole (DIFLUCAN) tablet 200 mg        200 mg Oral  Once 10/23/20 1541 10/23/20 1608       Assessment/Plan Confusion - resolved SPCM/FTT - patient desires full code and full scope of care Stomatitis - dental implant causing pain.  Defer to primary service.  When I eval yesterday, I didn't see anything obvious such as abscess or ulcerations.  9 weeks postop from ex lap with detorsion of SB with placement of g-tube.  Skin excoriation from leaking g-tube site -no infection noted -midline wound with VAC in place. -gerhardt's butt cream ordered for a barrier.  She is supposed to be using Ilex barrier cream at home to help control  this. -due to PCM/FTT/severe pulmonary fibrosis/steroids, etc, she is not a surgical candidate to consider closure of the g-tube site surgically. -none of her abdominal wall has signs of infection.  Do not think she needs the nystatin powder, diflucan, or septra for her abdomen.  -IR to attempt replacement of g-tube through old g-tube site today.  If they are unable to place through this site, will defer further placement as we do not want another hole made as she will not heal this site either due to her poor healing abilities.  FEN - NPO for IR procedure VTE - lovenox ID - diflucan, septra x5 days, doesn't need from abdominal wound standpoint.  This is not infected   LOS: 5 days    Henreitta Cea , [facility] 10/28/2020, 8:25 AM Please see Amion for pager number during day hours 7:00am-4:30pm or 7:00am -11:30am on weekends

## 2020-10-28 NOTE — Progress Notes (Signed)
PHARMACY NOTE:  Pharmacy has been assisting with dosing of Fluconazole for thrush. Dosage remains stable at 100mg  PO daily and need for further dosage adjustment appears unlikely at present.    Will sign off at this time.  Please reconsult if a change in clinical status warrants re-evaluation of dosage.   Lindell Spar, PharmD, BCPS Clinical Pharmacist  10/28/2020 7:32 AM

## 2020-10-29 ENCOUNTER — Inpatient Hospital Stay (HOSPITAL_COMMUNITY): Payer: Medicare Other

## 2020-10-29 ENCOUNTER — Encounter (HOSPITAL_COMMUNITY): Payer: Self-pay | Admitting: Family Medicine

## 2020-10-29 DIAGNOSIS — I5032 Chronic diastolic (congestive) heart failure: Secondary | ICD-10-CM | POA: Diagnosis not present

## 2020-10-29 DIAGNOSIS — A419 Sepsis, unspecified organism: Secondary | ICD-10-CM | POA: Diagnosis not present

## 2020-10-29 DIAGNOSIS — K1379 Other lesions of oral mucosa: Secondary | ICD-10-CM

## 2020-10-29 DIAGNOSIS — L03311 Cellulitis of abdominal wall: Secondary | ICD-10-CM | POA: Diagnosis not present

## 2020-10-29 DIAGNOSIS — I2510 Atherosclerotic heart disease of native coronary artery without angina pectoris: Secondary | ICD-10-CM | POA: Diagnosis not present

## 2020-10-29 HISTORY — PX: IR REPLC GASTRO/COLONIC TUBE PERCUT W/FLUORO: IMG2333

## 2020-10-29 LAB — COMPREHENSIVE METABOLIC PANEL
ALT: 32 U/L (ref 0–44)
AST: 57 U/L — ABNORMAL HIGH (ref 15–41)
Albumin: 2.2 g/dL — ABNORMAL LOW (ref 3.5–5.0)
Alkaline Phosphatase: 94 U/L (ref 38–126)
Anion gap: 8 (ref 5–15)
BUN: 11 mg/dL (ref 8–23)
CO2: 26 mmol/L (ref 22–32)
Calcium: 8.6 mg/dL — ABNORMAL LOW (ref 8.9–10.3)
Chloride: 102 mmol/L (ref 98–111)
Creatinine, Ser: 0.62 mg/dL (ref 0.44–1.00)
GFR, Estimated: >60 mL/min (ref >60)
Glucose, Bld: 77 mg/dL (ref 70–99)
Potassium: 4.3 mmol/L (ref 3.5–5.1)
Sodium: 136 mmol/L (ref 135–145)
Total Bilirubin: 0.5 mg/dL (ref 0.3–1.2)
Total Protein: 5.7 g/dL — ABNORMAL LOW (ref 6.5–8.1)

## 2020-10-29 LAB — CBC WITH DIFFERENTIAL/PLATELET
Abs Immature Granulocytes: 0.07 10*3/uL (ref 0.00–0.07)
Basophils Absolute: 0.1 10*3/uL (ref 0.0–0.1)
Basophils Relative: 1 %
Eosinophils Absolute: 0.1 10*3/uL (ref 0.0–0.5)
Eosinophils Relative: 1 %
HCT: 36.9 % (ref 36.0–46.0)
Hemoglobin: 11.5 g/dL — ABNORMAL LOW (ref 12.0–15.0)
Immature Granulocytes: 1 %
Lymphocytes Relative: 49 %
Lymphs Abs: 5.3 10*3/uL — ABNORMAL HIGH (ref 0.7–4.0)
MCH: 30.7 pg (ref 26.0–34.0)
MCHC: 31.2 g/dL (ref 30.0–36.0)
MCV: 98.7 fL (ref 80.0–100.0)
Monocytes Absolute: 1 10*3/uL (ref 0.1–1.0)
Monocytes Relative: 10 %
Neutro Abs: 4 10*3/uL (ref 1.7–7.7)
Neutrophils Relative %: 38 %
Platelets: 223 10*3/uL (ref 150–400)
RBC: 3.74 MIL/uL — ABNORMAL LOW (ref 3.87–5.11)
RDW: 20.9 % — ABNORMAL HIGH (ref 11.5–15.5)
WBC: 10.7 10*3/uL — ABNORMAL HIGH (ref 4.0–10.5)
nRBC: 0 % (ref 0.0–0.2)

## 2020-10-29 LAB — PHOSPHORUS: Phosphorus: 2.6 mg/dL (ref 2.5–4.6)

## 2020-10-29 LAB — MAGNESIUM: Magnesium: 1.9 mg/dL (ref 1.7–2.4)

## 2020-10-29 MED ORDER — OSMOLITE 1.2 CAL PO LIQD
1000.0000 mL | ORAL | Status: DC
  Administered 2020-10-29 – 2020-11-02 (×3): 1000 mL
  Filled 2020-10-29 (×4): qty 1000

## 2020-10-29 MED ORDER — FENTANYL CITRATE (PF) 100 MCG/2ML IJ SOLN
INTRAMUSCULAR | Status: AC | PRN
  Administered 2020-10-29: 25 ug via INTRAVENOUS

## 2020-10-29 MED ORDER — MIDAZOLAM HCL 2 MG/2ML IJ SOLN
INTRAMUSCULAR | Status: AC
  Filled 2020-10-29: qty 2

## 2020-10-29 MED ORDER — SODIUM CHLORIDE 0.9 % IV BOLUS: 500.0000 mL | Freq: Once | INTRAVENOUS | Status: DC

## 2020-10-29 MED ORDER — FENTANYL CITRATE (PF) 100 MCG/2ML IJ SOLN
INTRAMUSCULAR | Status: AC
  Filled 2020-10-29: qty 2

## 2020-10-29 MED ORDER — ENOXAPARIN SODIUM 30 MG/0.3ML ~~LOC~~ SOLN
30.0000 mg | SUBCUTANEOUS | Status: DC
  Administered 2020-10-30: 30 mg via SUBCUTANEOUS
  Filled 2020-10-29 (×2): qty 0.3

## 2020-10-29 MED ORDER — SODIUM CHLORIDE 0.9 % IV BOLUS
1000.0000 mL | Freq: Once | INTRAVENOUS | Status: AC
  Administered 2020-10-29: 1000 mL via INTRAVENOUS

## 2020-10-29 MED ORDER — IOHEXOL 300 MG/ML  SOLN
75.0000 mL | Freq: Once | INTRAMUSCULAR | Status: AC | PRN
  Administered 2020-10-29: 75 mL via INTRAVENOUS

## 2020-10-29 MED ORDER — LIDOCAINE HCL (PF) 1 % IJ SOLN
INTRAMUSCULAR | Status: AC | PRN
  Administered 2020-10-29: 10 mL via INTRADERMAL

## 2020-10-29 MED ORDER — LIDOCAINE VISCOUS HCL 2 % MT SOLN
OROMUCOSAL | Status: AC
  Administered 2020-10-29: 5 mL
  Filled 2020-10-29: qty 15

## 2020-10-29 MED ORDER — IOHEXOL 300 MG/ML  SOLN
50.0000 mL | Freq: Once | INTRAMUSCULAR | Status: AC | PRN
  Administered 2020-10-29: 10 mL

## 2020-10-29 MED ORDER — LIDOCAINE HCL (PF) 1 % IJ SOLN
INTRAMUSCULAR | Status: AC
  Filled 2020-10-29: qty 30

## 2020-10-29 MED ORDER — MIDAZOLAM HCL 2 MG/2ML IJ SOLN
INTRAMUSCULAR | Status: AC | PRN
  Administered 2020-10-29: 1 mg via INTRAVENOUS
  Administered 2020-10-29 (×2): 0.5 mg via INTRAVENOUS

## 2020-10-29 NOTE — Progress Notes (Signed)
   10/29/20 2110  Assess: MEWS Score  Temp 98 F (36.7 C)  BP (!) 77/49  Pulse Rate 90  Resp 17  Level of Consciousness Alert  SpO2 94 %  O2 Device Room Air  Assess: MEWS Score  MEWS Temp 0  MEWS Systolic 2  MEWS Pulse 0  MEWS RR 0  MEWS LOC 0  MEWS Score 2  MEWS Score Color Yellow  Assess: if the MEWS score is Yellow or Red  Were vital signs taken at a resting state? Yes  Focused Assessment Change from prior assessment (see assessment flowsheet)  Early Detection of Sepsis Score *See Row Information* Low  MEWS guidelines implemented *See Row Information* Yes  Treat  MEWS Interventions Other (Comment) (Provider notified, waiting for response.)  Pain Scale 0-10  Pain Score 0  Take Vital Signs  Increase Vital Sign Frequency  Yellow: Q 2hr X 2 then Q 4hr X 2, if remains yellow, continue Q 4hrs  Escalate  MEWS: Escalate Yellow: discuss with charge nurse/RN and consider discussing with provider and RRT  Notify: Charge Nurse/RN  Name of Charge Nurse/RN Notified [place], RN  Date Charge Nurse/RN Notified 10/29/20  Time Charge Nurse/RN Notified 2010  Notify: Provider  Provider Name/Title Jeannette Corpus, NP  Date Provider Notified 10/29/20  Notification Type Page  Notification Reason Other (Comment) (Low BP)  Document  Patient Outcome Not stable and remains on department  Progress note created (see row info) Yes

## 2020-10-29 NOTE — Progress Notes (Signed)
Subjective: No acute changes. IR to attempt replacement of G tube today.  Objective: Vital signs in last 24 hours: Temp:  [97.6 F (36.4 C)-98.3 F (36.8 C)] 97.6 F (36.4 C) (04/21 0517) Pulse Rate:  [82-91] 82 (04/21 0517) Resp:  [14-21] 20 (04/21 0517) BP: (105-121)/(61-72) 106/68 (04/21 0517) SpO2:  [93 %-98 %] 98 % (04/21 0517) Weight:  [40.1 kg] 40.1 kg (04/21 0517) Last BM Date: 10/27/20  Intake/Output from previous day: 04/20 0701 - 04/21 0700 In: 162 [IV Piggyback:162] Out: -  Intake/Output this shift: No intake/output data recorded.  PE: Abd: soft, cachetic, midline wound with VAC in place. G tube site is clean and dry with surrounding skin excoriation, but this appears improved from prior exams.  Lab Results:  Recent Labs    10/28/20 0852 10/29/20 0508  WBC 11.9* 10.7*  HGB 11.8* 11.5*  HCT 37.0 36.9  PLT 225 223   BMET Recent Labs    10/28/20 0458 10/29/20 0508  NA 138 136  K 3.9 4.3  CL 101 102  CO2 28 26  GLUCOSE 83 77  BUN 15 11  CREATININE 0.63 0.62  CALCIUM 8.5* 8.6*   PT/INR No results for input(s): LABPROT, INR in the last 72 hours. CMP     Component Value Date/Time   NA 136 10/29/2020 0508   NA 138 04/29/2020 0948   K 4.3 10/29/2020 0508   CL 102 10/29/2020 0508   CO2 26 10/29/2020 0508   GLUCOSE 77 10/29/2020 0508   BUN 11 10/29/2020 0508   BUN 11 04/29/2020 0948   CREATININE 0.62 10/29/2020 0508   CREATININE 0.74 11/30/2015 1327   CALCIUM 8.6 (L) 10/29/2020 0508   PROT 5.7 (L) 10/29/2020 0508   PROT 6.5 04/29/2020 0948   ALBUMIN 2.2 (L) 10/29/2020 0508   ALBUMIN 3.8 04/29/2020 0948   AST 57 (H) 10/29/2020 0508   ALT 32 10/29/2020 0508   ALKPHOS 94 10/29/2020 0508   BILITOT 0.5 10/29/2020 0508   BILITOT 0.7 04/29/2020 0948   GFRNONAA >60 10/29/2020 0508   GFRAA 69 04/29/2020 0948   Lipase     Component Value Date/Time   LIPASE 24 10/16/2020 1659       Studies/Results: No results  found.  Anti-infectives: Anti-infectives (From admission, onward)   Start     Dose/Rate Route Frequency Ordered Stop   10/26/20 1300  fluconazole (DIFLUCAN) tablet 100 mg        100 mg Oral Daily 10/26/20 1145     10/26/20 1230  sulfamethoxazole-trimethoprim (BACTRIM DS) 800-160 MG per tablet 1 tablet        1 tablet Oral Every 12 hours 10/26/20 1145 10/27/20 2219   10/24/20 1600  fluconazole (DIFLUCAN) IVPB 100 mg  Status:  Discontinued        100 mg 50 mL/hr over 60 Minutes Intravenous Every 24 hours 10/23/20 2009 10/26/20 1145   10/24/20 1000  sulfamethoxazole-trimethoprim (BACTRIM) 160 mg in dextrose 5 % 250 mL IVPB  Status:  Discontinued        160 mg 260 mL/hr over 60 Minutes Intravenous Every 12 hours 10/23/20 2025 10/26/20 1145   10/23/20 2100  sulfamethoxazole-trimethoprim (BACTRIM) 160 mg in dextrose 5 % 250 mL IVPB        160 mg 260 mL/hr over 60 Minutes Intravenous  Once 10/23/20 2025 10/23/20 2220   10/23/20 1715  cefTRIAXone (ROCEPHIN) 2 g in sodium chloride 0.9 % 100 mL IVPB  2 g 200 mL/hr over 30 Minutes Intravenous  Once 10/23/20 1704 10/23/20 1805   10/23/20 1715  metroNIDAZOLE (FLAGYL) IVPB 500 mg  Status:  Discontinued        500 mg 100 mL/hr over 60 Minutes Intravenous  Once 10/23/20 1704 10/23/20 1937   10/23/20 1545  fluconazole (DIFLUCAN) tablet 200 mg        200 mg Oral  Once 10/23/20 1541 10/23/20 1608       Assessment/Plan Confusion - resolved SPCM/FTT - patient desires full code and full scope of care Stomatitis - dental implant causing pain.  Defer to primary service.   9 weeks postop from ex lap with detorsion of SB with placement of g-tube.  Skin excoriation from leaking g-tube site -no infection noted -midline wound with VAC in place. -Apply barrier cream to skin around G tube site BID and more often as needed to protect the skin. Appears to be improving. -due to PCM/FTT/severe pulmonary fibrosis/steroids, etc, she is not a surgical  candidate to consider closure of the g-tube site surgically. -none of her abdominal wall has signs of infection.  Do not think she needs the nystatin powder, diflucan, or septra for her abdomen.  -IR to attempt replacement of g-tube through old g-tube site today. If it cannot be replaced through the same tract, do not recommend replacement at all as a new site is highly unlikely to heal. If tube is replaced can give gastric feeds to supplement nutrition and hopefully aid with wound healing.  FEN - NPO for IR procedure VTE - lovenox ID - diflucan, septra x5 days, doesn't need from abdominal wound standpoint.   LOS: 6 days    Dwan Bolt , [facility] 10/29/2020, 10:45 AM Please see Amion for pager number during day hours 7:00am-4:30pm or 7:00am -11:30am on weekends

## 2020-10-29 NOTE — Care Management Important Message (Signed)
Important Message  Patient Details IM Letter given to the Patient. Name: [name] [name] MRN: [id number] Date of Birth: 10/24/1930   Medicare Important Message Given:  Yes     Kerin Salen 10/29/2020, 11:18 AM

## 2020-10-29 NOTE — Progress Notes (Signed)
Occupational Therapy Treatment Patient Details Name: [name] [name] MRN: [id number] DOB: 1931-03-08 Today's Date: 10/29/2020    History of present illness Pt is 87 yo female admitted on 10/23/20 with hx of ex lap in Feb 2022, readmission Feb-March with complications, and continued to have issues with poor appetite, odynophagia, symptoms of fungal infection , and worsening weakness.  Pt now admitted with abdominal wall cellulitis (open wound).  Pt with PMH including UIP on 2L O2, chronic prednisone, RA, osteoporosis, HTN, history of DVT/PE, mild CAD, and SBO s/p ex lap February 2022 with G tube placement since removed   OT comments  Patient needing increased time for bed mobility keeps stating "I'm cold." Patient min A to upright trunk and obtain sitting balance edge of bed. With walker and mod A patient able to pivot to bedside commode needing cues for safety due to letting go of walker prematurely. Set up for peri care seated on toilet. Poor carry over of cues and needing mod A to steady and cues for safety with walker to transfer to recliner. Patient currently high fall risk, continue to recommend ST rehab at D/C.    Follow Up Recommendations  SNF    Equipment Recommendations  None recommended by OT       Precautions / Restrictions Precautions Precautions: Fall Precaution Comments: NPWT abdomen Restrictions Weight Bearing Restrictions: No       Mobility Bed Mobility Overal bed mobility: Needs Assistance Bed Mobility: Rolling;Sidelying to Sit Rolling: Supervision Sidelying to sit: Min assist       General bed mobility comments: assist for trunk upright    Transfers Overall transfer level: Needs assistance Equipment used: Rolling walker (2 wheeled) Transfers: Sit to/from [facility] Sit to Stand: Mod assist Stand pivot transfers: Mod assist       General transfer comment: patient unsteady, fall risk. letting go of walker prematurely before safely aligned  with chair    Balance Overall balance assessment: Needs assistance Sitting-balance support: Feet supported Sitting balance-[name]: Fair     Standing balance support: Bilateral upper extremity supported Standing balance-[name]: Poor Standing balance comment: reliant on UE support and min to mod A for safety                           ADL either performed or assessed with clinical judgement   ADL Overall ADL's : Needs assistance/impaired     Grooming: Wash/dry face;Bed level;Minimal assistance Grooming Details (indicate cue type and reason): assist for thoroughness                 Toilet Transfer: Moderate assistance;Stand-pivot;RW;BSC Toilet Transfer Details (indicate cue type and reason): unsteady, poor safety awareness Toileting- Clothing Manipulation and Hygiene: Supervision/safety;Set up;Sitting/lateral lean Toileting - Clothing Manipulation Details (indicate cue type and reason): for peri care after voiding, patient able to complete seated on commode     Functional mobility during ADLs: Moderate assistance;Rolling walker;Cueing for safety;Cueing for sequencing                 Cognition Arousal/Alertness: Awake/alert Behavior During Therapy: Flat affect Overall Cognitive Status: No family/caregiver present to determine baseline cognitive functioning                                 General Comments: needing increased time to initiate OOB mobility, once up more interactive/following directions appropriately  Pertinent Vitals/ Pain       Pain Assessment: Faces Faces Pain Scale: Hurts even more Pain Location: mouth Pain Descriptors / Indicators: Sore;Grimacing Pain Intervention(s): Monitored during session         Frequency  Min 2X/week        Progress Toward Goals  OT Goals(current goals can now be found in the care plan section)  Progress towards OT goals: Progressing toward goals  Acute  Rehab OT Goals Patient Stated Goal: agreeable to get to chair OT Goal Formulation: With patient Time For Goal Achievement: 11/07/20 Potential to Achieve Goals: Fair ADL Goals Pt Will Perform Grooming: with supervision;sitting Pt Will Perform Upper Body Dressing: with min assist;sitting Pt Will Perform Lower Body Dressing: with mod assist;sit to/from stand Pt Will Transfer to Toilet: with min assist;ambulating;bedside commode Pt Will Perform Toileting - Clothing Manipulation and hygiene: with mod assist;sit to/from stand Additional ADL Goal #1: Pt will demonstrate fair sitting balance for participation in ADL.  Plan Discharge plan remains appropriate       AM-PAC OT "6 Clicks" Daily Activity     Outcome Measure   Help from another person eating meals?: A Little Help from another person taking care of personal grooming?: A Little Help from another person toileting, which includes using toliet, bedpan, or urinal?: A Lot Help from another person bathing (including washing, rinsing, drying)?: A Lot Help from another person to put on and taking off regular upper body clothing?: A Lot Help from another person to put on and taking off regular lower body clothing?: Total 6 Click Score: 13    End of Session Equipment Utilized During Treatment: Rolling walker;Gait belt  OT Visit Diagnosis: Unsteadiness on feet (R26.81);Other abnormalities of gait and mobility (R26.89);Pain;Muscle weakness (generalized) (M62.81);Other symptoms and signs involving cognitive function Pain - part of body:  (mouth)   Activity Tolerance Patient tolerated treatment well   Patient Left in chair;with call bell/phone within reach;with chair alarm set   Nurse Communication Mobility status        Time: 7416-3845 OT Time Calculation (min): 24 min  Charges: OT General Charges $OT Visit: 1 Visit OT Treatments $Self Care/Home Management : 23-37 mins  Delbert Phenix OT OT pager: [phone]   Rosemary Holms 10/29/2020, 10:21 AM

## 2020-10-29 NOTE — Progress Notes (Signed)
PROGRESS NOTE    [name] [name]  NLZ:[id number] DOB: [birth year] DOA: 10/23/2020 PCP: Hoyt Koch, MD  Brief Narrative:  The patient is an 87 year old elderly female with a past medical history significant for but not limited to UIP on 2 L of supplemental oxygen via nasal cannula, chronic prednisone, rheumatoid arthritis, history of DVT and PE, mild CAD, history of SBO status post exploratory lap in February 2022 with G-tube placement who has since had it removed who returned to the ED on 10/23/2020 with worsening abdominal pain and wound drainage.  She was admitted for a few weeks in February and March 9024 for complications after ex lap requiring TPN and G-tube placement which was then ultimately removed.  Patient discharged to SNF for less than 2 weeks and then returned home with 24-hour care with close surgery follow-up.  She was then referred to wound care center and has been having a wound RN from [facility] performing twice daily wound dressings.  She is continued struggle for weeks with poor appetite, and now odynophagia and symptoms of fungal infection last few days she has been worsening with generalized weakness.  A superficial culture was taken of the wound and showed polymicrobial infection with plans to treat with Bactrim though the family stated that she is unlikely to swallow pills as it would upset her stomach.  In the ED she is noted to be tachypneic and tachycardic and found to be septic and given a sepsis protocol 30 cc/kg.  General surgery evaluated and managed and admitted this patient.  Currently she is status post 5 days of antibiotics and wound VAC has been applied to her mid abdominal wound and a G-tube is planned for placement today if possible.  Her appetite is extremely poor.  Palliative care has been called for goals of care discussion however family is clear in her wishes and want everything done currently.  Assessment & Plan:   Principal Problem:   Abdominal wall  cellulitis Active Problems:   CAD (coronary artery disease)   RA (rheumatoid arthritis) (HCC)   Chronic respiratory failure with hypoxia (HCC)   Chronic diastolic CHF (congestive heart failure) (HCC)   Iron deficiency anemia   Personal history of PE (pulmonary embolism)   Unintentional weight loss   ILD (interstitial lung disease) (HCC)   Protein-calorie malnutrition, severe   Severe sepsis (HCC)  Severe Sepsis, poA:  -The patient was tachypneic, tachycardic with leukocytosis and increasing drainage from abdominal wound suggestive of cellulitis. Lactic acid 2.7 indicating end organ dysfunction. -Aim to complete antibiotics after 5 days. Based on culture data and hoping to minimize nephrotoxins (e.g. zosyn, vancomycin), will continue bactrim > changed to PO now that po intake improving. Bactrim is now complte -Continuing to  Monitor culture data. -She is Afebrile but WBC went up  Briefly and is now gone from 9.5 -> 11.9 -> 10.7  Thrush, mouth pain:  -Continue magic mouthwash, diflucan.  -C/w Benzocaine Mouth/Throat Pain 4 Times Daily prN -Mouth pain also related to dental implant screws with irritation.  -Recommend dentistry follow up as outpatient however pain is severe so have asked Dr. Benson Norway to evaluate  -Given pain and because her lip started swelling yesterday will get CT Maxillofacial to evaluate further -Lip Swelling improved with low dose Benadryl -Follow up on Dentistry Recc's and Maxillofacial Read   Nonhealing abdominal wounds, MASD:  -Unable to stop prednisone. Remains on 5 mg po Daily with Breakfast  -Optimize nutrition. Initiate tube feeds if and once G  tube inserted. - Wound care per general surgery > initiate wound vac   - Continue with barrier cream but Surgery has increased this to BID  Dehydration Severe protein-calorie malnutrition, failure to thrive, generalized weakness:  -Appears emaciated. Ketonuria without glucosuria.  -Dietitian consulted. Prealbumin  is 8. Plan to consult IR for G tube reinsertion and initiate tube feeds once able and hopefully done today through the old G-Tube site.  Per surgery if they are unable to place it to the side they will defer further placement as they do not want another hole as she will not heal this either due to her poor healing abilities as she is no longer surgical candidate given her failure to thrive and severe pulmonary fibrosis; Unfortunately unable to be done yesterday due to scheduling so will be done today at 1500 -SLP evaluation > mild aspiration risk, unable to chew so dysphagia 1 diet, thin liquids recommended. No dysphagia noted.  -C/w Creon 36,000 units po TID before meals  -C/w with Mirtazepine 15 gmg po qHS for appetite stimulation. -Continued IVF and now stopped. Last EF preserved, indeterminate diastolic function, and no evidence of volume overload.  -C/w Prosource Pluse 30 mL po TID and Boost Breeze 1 Contatiner po TID between meals; Per Dietitian "if IR able to place PEG, recommend Osmolite 1.2 @ 20 ml/hr to advance by 10 ml every 24 hours to reach goal rate of 50 ml/hr with 45 ml Prosource TF once/day and 100 ml free water QID." -PT, OT, TOC consulted -Palliative Consulted for GOC Discussion and will continue   Acute Delirium -Improved. -C/w Delirium precautions, prefer to avoid sedating agents as much as possible.  Hyperkalemia, improved AKI, improved -Improved.  -Lokelma given with resolution. -Initiated on IVF but this has now been discontinued -Patient's potassium this morning 4.3 -Patient's BUN/Creatinine is now stable at 11/0.62 -Will need continued monitoring while on bactrim. -Avoid nephrotoxic medications, contrast dyes, hypotension and renally dose medications -Repeat CMP in the AM   Hypokalemia, hypomagnesemia, hypophosphatemia:  -At risk of refeeding syndrome, continue checking regularly and supplementing as indicated. -Her K+ is now 4.3, mag level is 1.9 and Phos Level  today was 2.6 -Continue monitor and trend and replete as necessary -Repeat CMP in a.m.  Chronic Hypoxic Respiratory Failure due to UIP:  -No infiltrate on CXR. At baseline. Included portions of lungs on CT abd are stable without acute findings. - Continue 2L O2. -SpO2: 98 % O2 Flow Rate (L/min): 2 L/min -Continue prednisone -Restarted Mycophenolate 1000 mg po BID and Nintendanib on 10/27/20 given low suspicion for severe infection.  Goals of Care -These were introduced at admission; Given the combination of malnutrition, declining functional status, advanced age, and chronic immunosuppression suggest these wounds may never heal. The patient and family are very clear in their wishes at this time, however. They would like any and all available aggressive interventions with curative intent. This includes prolonged TPN, intensive wound care and physical therapy, IV antibiotics, and hospital monitoring.  -Full code confirmed. -Palliative care following as inpatient and Dr. Domingo Cocking to weigh in. They had an appointment with [facility] as outpatient pending.  Right Foot Drop:  -CT head without acute intracranial abnormality. Suspect stretch peroneal nerve palsy.  -PT/OT recommending SNF  Gallbladder distention, CBD Dilatation:  -Stable appearance on CT abd/pelvis 4/16 without inflammatory findings. LFTs wnl and no focal pain overlying this area. No further evaluation currently planned. -Follow up as an outpatient   Elevated AST -Mild and likely reactive -Patient's AST  is now 3 -Continue monitor and trend and if necessary will obtain a right upper quadrant ultrasound -Repeat CMP in the AM   COVID-19 Exposure -Per Dr. Chauncey Reading reported tested postive SARS-CoV-2 on 4/19 and last contact with the patient was 4/18 -Patient is Fully Vaccinated and Screend Negative on 4/15/ -ID recommendations per Dr. Baxter Flattery:   "Pt should wear mask ideally all the time, though with her chronic hypoxic  respiratory failure, would recommend at least while caregivers are in the room. Door should remain closed.  - No aerosolizing medications are ordered and no aerosolizing procedures are scheduled.  - No PPE/precautions need to be implemented for staff unless patient becomes symptomatic.  - Of course, no further visitation allowable for HCPOA. - Retest the patient for covid 3 days after last exposure: This would be on 4/21." -Will retest COVID-19 today 10/29/20  DVT prophylaxis: Enoxaparin 30 mg sq q24h Code Status: FULL CODE  Family Communication: I spoke with [name] over the telephone Disposition Plan: Pending further clinical improvement and anticipate discharging to skilled nursing facility  Status is: Inpatient  Remains inpatient appropriate because:Unsafe d/c plan, IV treatments appropriate due to intensity of illness or inability to take PO and Inpatient level of care appropriate due to severity of illness   Dispo: The patient is from: Home              Anticipated d/c is to: SNF              Patient currently is not medically stable to d/c.   Difficult to place patient No  Consultants:   General Surgery  Palliative Care  Interventional Radiology    Procedures:   Antimicrobials:  Anti-infectives (From admission, onward)   Start     Dose/Rate Route Frequency Ordered Stop   10/26/20 1300  fluconazole (DIFLUCAN) tablet 100 mg        100 mg Oral Daily 10/26/20 1145     10/26/20 1230  sulfamethoxazole-trimethoprim (BACTRIM DS) 800-160 MG per tablet 1 tablet        1 tablet Oral Every 12 hours 10/26/20 1145 10/27/20 2219   10/24/20 1600  fluconazole (DIFLUCAN) IVPB 100 mg  Status:  Discontinued        100 mg 50 mL/hr over 60 Minutes Intravenous Every 24 hours 10/23/20 2009 10/26/20 1145   10/24/20 1000  sulfamethoxazole-trimethoprim (BACTRIM) 160 mg in dextrose 5 % 250 mL IVPB  Status:  Discontinued        160 mg 260 mL/hr over 60 Minutes Intravenous Every 12 hours  10/23/20 2025 10/26/20 1145   10/23/20 2100  sulfamethoxazole-trimethoprim (BACTRIM) 160 mg in dextrose 5 % 250 mL IVPB        160 mg 260 mL/hr over 60 Minutes Intravenous  Once 10/23/20 2025 10/23/20 2220   10/23/20 1715  cefTRIAXone (ROCEPHIN) 2 g in sodium chloride 0.9 % 100 mL IVPB        2 g 200 mL/hr over 30 Minutes Intravenous  Once 10/23/20 1704 10/23/20 1805   10/23/20 1715  metroNIDAZOLE (FLAGYL) IVPB 500 mg  Status:  Discontinued        500 mg 100 mL/hr over 60 Minutes Intravenous  Once 10/23/20 1704 10/23/20 1937   10/23/20 1545  fluconazole (DIFLUCAN) tablet 200 mg        200 mg Oral  Once 10/23/20 1541 10/23/20 1608        Subjective: Seen and examined at bedside and continues to complain of mouth and jaw pain  specifically on the right side.  No nausea or vomiting.  Denies any abdominal complaints currently.  Resting in the chair at bedside.  No other concerns or complaints at this time.  Objective: Vitals:   10/28/20 0500 10/28/20 1259 10/28/20 2028 10/29/20 0517  BP:  105/61 121/72 106/68  Pulse:  84 91 82  Resp:  14 (!) 21 20  Temp:  98.3 F (36.8 C) 98.1 F (36.7 C) 97.6 F (36.4 C)  TempSrc:  Oral Oral   SpO2:  93% 94% 98%  Weight: 40.5 kg   40.1 kg  Height:        Intake/Output Summary (Last 24 hours) at 10/29/2020 1153 Last data filed at 10/29/2020 0600 Gross per 24 hour  Intake 162.02 ml  Output --  Net 162.02 ml   Filed Weights   10/26/20 0500 10/28/20 0500 10/29/20 0517  Weight: 41.2 kg 40.5 kg 40.1 kg   Examination: Physical Exam:  Constitutional: Extremely thin and cachectic elderly female sitting in the chair still complaining of some mouth and jaw pain Eyes: Lids and conjunctivae normal, sclerae anicteric  ENMT: External Ears, Nose appear normal. Grossly normal hearing.  Neck: Appears normal, supple, no cervical masses, normal ROM, no appreciable thyromegaly; no JVD Respiratory: Diminished to auscultation bilaterally, no wheezing, rales,  rhonchi or crackles. Normal respiratory effort and patient is not tachypenic. No accessory muscle use.  Has unlabored breathing but is now wearing 2 L supplemental oxygen via nasal cannula again Cardiovascular: RRR, no murmurs / rubs / gallops. S1 and S2 auscultated. No extremity edema.  Abdomen: Soft, non-tender, non-distended. Bowel sounds positive.  GU: Deferred. Musculoskeletal: No clubbing / cyanosis of digits/nails. No joint deformity upper and lower extremities.  Skin: No rashes, lesions, ulcers on limited skin evaluation. No induration; Warm and dry.  Neurologic: CN 2-12 grossly intact with no focal deficits. Romberg sign and cerebellar reflexes not assessed.  Psychiatric: Normal judgment and insight. Alert and oriented x 3.  Slightly withdrawn mood and appropriate affect.   Data Reviewed: I have personally reviewed following labs and imaging studies  CBC: Recent Labs  Lab 10/23/20 1558 10/24/20 0519 10/28/20 0852 10/29/20 0508  WBC 11.4* 9.5 11.9* 10.7*  NEUTROABS 5.1  --  3.5 4.0  HGB 11.9* 12.0 11.8* 11.5*  HCT 38.7 38.6 37.0 36.9  MCV 101.0* 99.5 98.7 98.7  PLT 326 227 225 086   Basic Metabolic Panel: Recent Labs  Lab 10/25/20 1758 10/26/20 0453 10/27/20 0535 10/28/20 0458 10/28/20 0852 10/29/20 0508  NA 134* 138 139 138  --  136  K 4.1 4.1 3.9 3.9  --  4.3  CL 98 99 104 101  --  102  CO2 28 28 29 28   --  26  GLUCOSE 128* 76 82 83  --  77  BUN 11 10 10 15   --  11  CREATININE 0.72 0.68 0.66 0.63  --  0.62  CALCIUM 7.9* 8.2* 8.5* 8.5*  --  8.6*  MG 1.5* 2.0 1.9 1.8 1.7 1.9  PHOS 1.8* 2.9 2.5 2.3* 2.5 2.6   GFR: Estimated Creatinine Clearance: 30.8 mL/min (by C-G formula based on SCr of 0.62 mg/dL). Liver Function Tests: Recent Labs  Lab 10/23/20 1558 10/24/20 0519 10/29/20 0508  AST 32 17 57*  ALT 15 11 32  ALKPHOS 121 106 94  BILITOT 0.9 0.6 0.5  PROT 7.0 5.5* 5.7*  ALBUMIN 2.7* 2.1* 2.2*   No results for input(s): LIPASE, AMYLASE in the last 168  hours.  No results for input(s): AMMONIA in the last 168 hours. Coagulation Profile: Recent Labs  Lab 10/23/20 1558  INR 1.0   Cardiac Enzymes: No results for input(s): CKTOTAL, CKMB, CKMBINDEX, TROPONINI in the last 168 hours. BNP (last 3 results) Recent Labs    06/03/20 1001 09/29/20 1425  PROBNP 100 58.0   HbA1C: No results for input(s): HGBA1C in the last 72 hours. CBG: No results for input(s): GLUCAP in the last 168 hours. Lipid Profile: No results for input(s): CHOL, HDL, LDLCALC, TRIG, CHOLHDL, LDLDIRECT in the last 72 hours. Thyroid Function Tests: No results for input(s): TSH, T4TOTAL, FREET4, T3FREE, THYROIDAB in the last 72 hours. Anemia Panel: No results for input(s): VITAMINB12, FOLATE, FERRITIN, TIBC, IRON, RETICCTPCT in the last 72 hours. Sepsis Labs: Recent Labs  Lab 10/23/20 1600 10/23/20 1958  LATICACIDVEN 2.7* 3.4*    Recent Results (from the past 240 hour(s))  Blood Culture (routine x 2)     Status: None   Collection Time: 10/23/20  3:50 PM   Specimen: Site Not Specified; Blood  Result Value Ref Range Status   Specimen Description   Final    SITE NOT SPECIFIED Performed at [facility] [address], [place], [place] [address]    Special Requests   Final    BOTTLES DRAWN AEROBIC AND ANAEROBIC Blood Culture results may not be optimal due to an inadequate volume of blood received in culture bottles Performed at [facility] [address] [place], [place] [address]    Culture   Final    NO GROWTH 5 DAYS Performed at [facility], [place] [address], [place], [place] [address]    Report Status 10/28/2020 FINAL  Final  Blood Culture (routine x 2)     Status: None   Collection Time: 10/23/20  4:00 PM   Specimen: BLOOD  Result Value Ref Range Status   Specimen Description   Final    BLOOD SITE NOT SPECIFIED Performed at [facility] [address], [place], [place] [address]     Special Requests   Final    BOTTLES DRAWN AEROBIC AND ANAEROBIC Blood Culture results may not be optimal due to an inadequate volume of blood received in culture bottles Performed at [facility] [address] [place], [place] [address]    Culture   Final    NO GROWTH 5 DAYS Performed at [facility], [place] [address], [place], [place] [address]    Report Status 10/28/2020 FINAL  Final  Resp Panel by RT-PCR (Flu A&B, Covid) Nasopharyngeal Swab     Status: None   Collection Time: 10/23/20  5:34 PM   Specimen: Nasopharyngeal Swab; Nasopharyngeal(NP) swabs in vial transport medium  Result Value Ref Range Status   SARS Coronavirus 2 by RT PCR NEGATIVE NEGATIVE Final    Comment: (NOTE) SARS-CoV-2 target nucleic acids are NOT DETECTED.  The SARS-CoV-2 RNA is generally detectable in upper respiratory specimens during the acute phase of infection. The lowest concentration of SARS-CoV-2 viral copies this assay can detect is 138 copies/mL. A negative result does not preclude SARS-Cov-2 infection and should not be used as the sole basis for treatment or other patient management decisions. A negative result may occur with  improper specimen collection/handling, submission of specimen other than nasopharyngeal swab, presence of viral mutation(s) within the areas targeted by this assay, and inadequate number of viral copies(<138 copies/mL). A negative result must be combined with clinical observations,  patient history, and epidemiological information. The expected result is Negative.  Fact Sheet for Patients:  EntrepreneurPulse.com.au  Fact Sheet for Healthcare Providers:  IncredibleEmployment.be  This test is no t yet approved or cleared by the [place] FDA and  has been authorized for detection and/or diagnosis of SARS-CoV-2 by FDA under an Emergency Use Authorization (EUA). This EUA will remain  in effect (meaning this test  can be used) for the duration of the COVID-19 declaration under Section 564(b)(1) of the Act, 21 U.S.C.section 360bbb-3(b)(1), unless the authorization is terminated  or revoked sooner.       Influenza A by PCR NEGATIVE NEGATIVE Final   Influenza B by PCR NEGATIVE NEGATIVE Final    Comment: (NOTE) The Xpert Xpress SARS-CoV-2/FLU/RSV plus assay is intended as an aid in the diagnosis of influenza from Nasopharyngeal swab specimens and should not be used as a sole basis for treatment. Nasal washings and aspirates are unacceptable for Xpert Xpress SARS-CoV-2/FLU/RSV testing.  Fact Sheet for Patients: EntrepreneurPulse.com.au  Fact Sheet for Healthcare Providers: IncredibleEmployment.be  This test is not yet approved or cleared by the [place] FDA and has been authorized for detection and/or diagnosis of SARS-CoV-2 by FDA under an Emergency Use Authorization (EUA). This EUA will remain in effect (meaning this test can be used) for the duration of the COVID-19 declaration under Section 564(b)(1) of the Act, 21 U.S.C. section 360bbb-3(b)(1), unless the authorization is terminated or revoked.  Performed at [facility], [place] [address], [place], [place] [address]    RN Pressure Injury Documentation: Pressure Injury 09/05/20 Sacrum Mid Stage 2 -  Partial thickness loss of dermis presenting as a shallow open injury with a red, pink wound bed without slough. (Active)  09/05/20 2150  Location: Sacrum  Location Orientation: Mid  Staging: Stage 2 -  Partial thickness loss of dermis presenting as a shallow open injury with a red, pink wound bed without slough.  Wound Description (Comments):   Present on Admission:      Pressure Injury 09/05/20 Sacrum Left Stage 2 -  Partial thickness loss of dermis presenting as a shallow open injury with a red, pink wound bed without slough. (Active)  09/05/20 2150  Location: Sacrum  Location  Orientation: Left  Staging: Stage 2 -  Partial thickness loss of dermis presenting as a shallow open injury with a red, pink wound bed without slough.  Wound Description (Comments):   Present on Admission:    Estimated body mass index is 14.94 kg/m as calculated from the following:   Height as of this encounter: 5' 4.5" (1.638 m).   Weight as of this encounter: 40.1 kg.  Malnutrition Type:  Nutrition Problem: Severe Malnutrition Etiology: chronic illness  Malnutrition Characteristics:  Signs/Symptoms: severe fat depletion,severe muscle depletion,percent weight loss Percent weight loss: 13.5 %  Nutrition Interventions:  Interventions: Tube feeding   Radiology Studies: No results found.  Scheduled Meds: . (feeding supplement) PROSource Plus  30 mL Oral TID BM  . enoxaparin (LOVENOX) injection  30 mg Subcutaneous Q24H  . feeding supplement  1 Container Oral TID BM  . fluconazole  100 mg Oral Daily  . Gerhardt's butt cream   Topical BID  . lipase/protease/amylase  36,000 Units Oral TID AC  . magic mouthwash w/lidocaine  5 mL Oral TID  . mirtazapine  15 mg Oral QHS  . multivitamin with minerals  1 tablet Oral Daily  . mycophenolate  1,000 mg Oral BID  . Nintedanib  100 mg  Oral BID  . predniSONE  5 mg Oral Q breakfast   Continuous Infusions: . famotidine (PEPCID) IV 20 mg (10/28/20 2136)     LOS: 6 days   Kerney Elbe, DO [facility] PAGER is on AMION  If 7PM-7AM, please contact night-coverage [email or web address]

## 2020-10-29 NOTE — Progress Notes (Signed)
MEDICATION-RELATED CONSULT NOTE   IR Procedure Consult - Anticoagulant/Antiplatelet PTA/Inpatient Med List Review by Pharmacist    Procedure: G tube replacement     Completed: 4/21 at 1626 pm   Post-Procedural bleeding risk per IR MD assessment:  Standard  Antithrombotic medications on inpatient or PTA profile prior to procedure:  Lovenox 30mg  SQ qhs    Recommended restart time per IR Post-Procedure Guidelines:  Next day in am   Other considerations:      Plan:  Resume Lovenox 30mg  SQ q24 on 4/22 at 0800  Minda Ditto PharmD 10/29/2020, 4:55 PM

## 2020-10-29 NOTE — Progress Notes (Signed)
Attempted to give pt morning medications, pt expressed she felt short of breath and wanted to get back in bed. Pt placed in bed and O2 sats were checked, pt maintained 99% on 2L via nasal cannula. Pt refused meds at this time. Will attempt later.

## 2020-10-29 NOTE — Progress Notes (Signed)
Referring Physician(s): Kerney Elbe, DO  Supervising Physician: Mir, Sharen Heck  Patient Status:  [facility] - In-pt  Chief Complaint:  Malnutrition/anorexia  Brief History:  [name] [name] is an 87 year old elderly female with medical issues including UIP on 2 L of supplemental oxygen via nasal cannula, chronic prednisone, rheumatoid arthritis, history of DVT and PE, mild CAD.  She presented to the ED on 10/23/2020 with worsening abdominal pain and wound drainage.    She was admitted for a few weeks in February and March 5188 for complications after ex lap requiring TPN and G-tube placement.  The Gtube was ultimately removed.    She has continued struggle for weeks with poor appetite.  Palliative care has been called for goals of care discussion however family is clear in her wishes and want everything done currently.  She is here today for replacement of a gastrostomy tube.  Allergies: Crestor [rosuvastatin], Lactose intolerance (gi), Penicillins, and Imuran [azathioprine]  Medications: Prior to Admission medications   Medication Sig Start Date End Date Taking? Authorizing Provider  acetaminophen (TYLENOL) 160 MG/5ML solution Take 160 mg by mouth every 6 (six) hours as needed for moderate pain.   Yes [provider]  Ensure Plus (ENSURE PLUS) LIQD Take 237 mLs by mouth daily as needed (nutritional supplement).   Yes [provider]  fluconazole (DIFLUCAN) 150 MG tablet Take 1 tablet (150 mg total) by mouth every 3 (three) days. 10/20/20  Yes Hoyt Koch, MD  InFLIXimab (REMICADE IV) Inject 1 Dose into the vein as directed. Every 2 months   Yes [provider]  lipase/protease/amylase (CREON) 36000 UNITS CPEP capsule Take 1 capsule (36,000 Units total) by mouth 3 (three) times daily before meals. 10/09/20  Yes Hoyt Koch, MD  mupirocin ointment (BACTROBAN) 2 % Apply 1 application topically 2 (two) times daily. 09/29/20  Yes Hoyt Koch, MD  mycophenolate (CELLCEPT) 500 MG tablet 2 tabs bid Patient taking differently: Take 1,000 mg by mouth 2 (two) times daily. 01/27/20  Yes Charlynne Cousins, MD  Nintedanib (OFEV) 100 MG CAPS Take 1 capsule (100 mg total) by mouth 2 (two) times daily. 08/18/20  Yes Mannam, Praveen, MD  nitroGLYCERIN (NITROSTAT) 0.4 MG SL tablet Place 0.4 mg under the tongue every 5 (five) minutes as needed for chest pain.   Yes [provider]  potassium chloride (KLOR-CON) 10 MEQ tablet Take 20 Meq twice daily x 2 days and then 20 Meq daily Patient taking differently: Take 10 mEq by mouth every other day. 09/29/20  Yes Burns, Claudina Lick, MD  predniSONE (DELTASONE) 5 MG tablet TAKE 1 TABLET DAILY WITH BREAKFAST Patient taking differently: Take 5 mg by mouth daily with breakfast. 05/08/20  Yes Mannam, Praveen, MD  Skin Protectants, Misc. (ILEX SKIN PROTECTANT EX) Apply 1 application topically 2 (two) times daily.   Yes [provider]  famotidine (PEPCID) 20 MG tablet Take 1 tablet (20 mg total) by mouth daily. Patient not taking: No sig reported 09/08/20   Hosie Poisson, MD  fluticasone (FLONASE) 50 MCG/ACT nasal spray Place 2 sprays into both nostrils daily as needed for allergies or rhinitis. Patient not taking: No sig reported 03/17/15   Hoyt Koch, MD  furosemide (LASIX) 20 MG tablet Take 1 tablet by mouth daily as needed for swelling. Patient not taking: No sig reported 12/11/18   Burtis Junes, NP  nitroGLYCERIN (NITROSTAT) 0.4 MG SL tablet Place 1 tablet (0.4 mg total) under the tongue every  5 (five) minutes as needed for chest pain. Patient not taking: Reported on 10/23/2020 06/29/20 09/27/20  Burnell Blanks, MD  Nutritional Supplements (,FEEDING SUPPLEMENT, PROSOURCE PLUS) liquid Take 30 mLs by mouth 2 (two) times daily between meals. Patient not taking: No sig reported 09/07/20   Hosie Poisson, MD     Vital Signs: BP 109/65 (BP Location: Left Arm)   Pulse  95   Temp 98.3 F (36.8 C) (Oral)   Resp 18   Ht 5' 4.5" (1.638 m)   Wt 40.1 kg   SpO2 92%   BMI 14.94 kg/m   Physical Exam Vitals reviewed.  Constitutional:      Comments: Elderly, frail  Cardiovascular:     Rate and Rhythm: Normal rate.  Pulmonary:     Effort: Pulmonary effort is normal. No respiratory distress.  Neurological:     General: No focal deficit present.     Mental Status: She is alert.  Psychiatric:        Mood and Affect: Mood normal.        Behavior: Behavior normal.     Imaging: CT MAXILLOFACIAL W CONTRAST  Result Date: 10/29/2020 CLINICAL DATA:  Maxillofacial pain. Additional history provided: Thrush, mouth pain. Mouth pain also related to dental implants screws with irritation. Lip swelling. EXAM: CT MAXILLOFACIAL WITH CONTRAST TECHNIQUE: Multidetector CT imaging of the maxillofacial structures was performed with intravenous contrast. Multiplanar CT image reconstructions were also generated. CONTRAST:  40mL OMNIPAQUE IOHEXOL 300 MG/ML  SOLN COMPARISON:  CT head 10/24/2020. FINDINGS: Osseous: The patient is edentulous. There are several metallic dental prostheses. No acute bony abnormality or aggressive osseous lesion. Orbits: No acute or significant orbital finding. The globes are normal in size and contour. The extraocular muscles and optic nerve sheath complexes are symmetric and unremarkable. No intraorbital mass or abnormal intraorbital enhancement. Sinuses: Moderate volume frothy secretions and fluid level within the left sphenoid sinus. The paranasal sinuses are otherwise normally aerated. Soft tissues: There is fairly diffuse mucosal enhancement within the oral cavity. Findings likely reflect sequela of reported thrush/mucositis. No appreciable mass within the oral cavity, pharynx or larynx. Limited intracranial: Redemonstrated cerebral atrophy and chronic small vessel ischemic disease. No evidence of acute intracranial abnormality within the field of view.  IMPRESSION: Fairly diffuse mucosal enhancement within the oral cavity. Findings likely reflect sequela of reported thrush/mucositis. The patient is edentulous, although there are several metallic dental prostheses. Left sphenoid sinusitis. Electronically Signed   By: Kellie Simmering DO   On: 10/29/2020 13:43    Labs:  CBC: Recent Labs    10/23/20 1558 10/24/20 0519 10/28/20 0852 10/29/20 0508  WBC 11.4* 9.5 11.9* 10.7*  HGB 11.9* 12.0 11.8* 11.5*  HCT 38.7 38.6 37.0 36.9  PLT 326 227 225 223    COAGS: Recent Labs    09/30/20 1157 10/23/20 1558  INR 1.0 1.0  APTT  --  35    BMP: Recent Labs    01/24/20 1632 01/26/20 0627 02/12/20 1944 04/29/20 0948 06/03/20 1001 10/26/20 0453 10/27/20 0535 10/28/20 0458 10/29/20 0508  NA 138 141 139 138   < > 138 139 138 136  K 3.6 3.7 3.8 3.8   < > 4.1 3.9 3.9 4.3  CL 101 103 101 100   < > 99 104 101 102  CO2 27 29 27 26    < > 28 29 28 26   GLUCOSE 88 92 94 85   < > 76 82 83 77  BUN 10 13 15  11   < > 10 10 15 11   CALCIUM 9.0 9.1 8.8* 9.4   < > 8.2* 8.5* 8.5* 8.6*  CREATININE 0.57 0.71 0.58 0.87   < > 0.68 0.66 0.63 0.62  GFRNONAA >60 >60 >60 60   < > >60 >60 >60 >60  GFRAA >60 >60 >60 69  --   --   --   --   --    < > = values in this interval not displayed.    LIVER FUNCTION TESTS: Recent Labs    10/16/20 1659 10/23/20 1558 10/24/20 0519 10/29/20 0508  BILITOT 0.5 0.9 0.6 0.5  AST 24 32 17 57*  ALT 16 15 11  32  ALKPHOS 126 121 106 94  PROT 7.2 7.0 5.5* 5.7*  ALBUMIN 2.8* 2.7* 2.1* 2.2*    Assessment and Plan:  Malnutrition/Anorexia  Will proceed with image guided placement of a percutaneous gastrostomy tube today by Dr. Dwaine Gale.  Risks and benefits image guided gastrostomy tube placement was discussed with the patient's nephew including, but not limited to the need for a barium enema during the procedure, bleeding, infection, peritonitis and/or damage to adjacent structures.  All questions were answered, patient is  agreeable to proceed.  Consent signed and in chart.  Electronically Signed: Murrell Redden, PA-C 10/29/2020, 3:59 PM    I spent a total of 15 Minutes at the the patient's bedside AND on the patient's hospital floor or unit, greater than 50% of which was counseling/coordinating care for gastrostomy tube placement.

## 2020-10-29 NOTE — Procedures (Signed)
Interventional Radiology Procedure Note  Procedure: 20 fr G tube placed through existing access.  Indication: Dysphagia.  Dislodged G tube  Findings: Please refer to procedural dictation for full description.  Complications: None  EBL: < 10 mL  Miachel Roux, MD [phone]

## 2020-10-30 DIAGNOSIS — A419 Sepsis, unspecified organism: Secondary | ICD-10-CM | POA: Diagnosis not present

## 2020-10-30 DIAGNOSIS — Z7189 Other specified counseling: Secondary | ICD-10-CM | POA: Diagnosis not present

## 2020-10-30 DIAGNOSIS — K0820 Unspecified atrophy of edentulous alveolar ridge: Secondary | ICD-10-CM | POA: Diagnosis not present

## 2020-10-30 DIAGNOSIS — I5032 Chronic diastolic (congestive) heart failure: Secondary | ICD-10-CM | POA: Diagnosis not present

## 2020-10-30 DIAGNOSIS — R627 Adult failure to thrive: Secondary | ICD-10-CM | POA: Diagnosis not present

## 2020-10-30 DIAGNOSIS — L03311 Cellulitis of abdominal wall: Secondary | ICD-10-CM | POA: Diagnosis not present

## 2020-10-30 DIAGNOSIS — R220 Localized swelling, mass and lump, head: Secondary | ICD-10-CM

## 2020-10-30 DIAGNOSIS — I2510 Atherosclerotic heart disease of native coronary artery without angina pectoris: Secondary | ICD-10-CM | POA: Diagnosis not present

## 2020-10-30 DIAGNOSIS — Z515 Encounter for palliative care: Secondary | ICD-10-CM

## 2020-10-30 LAB — GLUCOSE, CAPILLARY
Glucose-Capillary: 148 mg/dL — ABNORMAL HIGH (ref 70–99)
Glucose-Capillary: 163 mg/dL — ABNORMAL HIGH (ref 70–99)

## 2020-10-30 LAB — CBC WITH DIFFERENTIAL/PLATELET
Abs Immature Granulocytes: 0.07 10*3/uL (ref 0.00–0.07)
Basophils Absolute: 0.1 10*3/uL (ref 0.0–0.1)
Basophils Relative: 1 %
Eosinophils Absolute: 0.1 10*3/uL (ref 0.0–0.5)
Eosinophils Relative: 1 %
HCT: 33.1 % — ABNORMAL LOW (ref 36.0–46.0)
Hemoglobin: 10.5 g/dL — ABNORMAL LOW (ref 12.0–15.0)
Immature Granulocytes: 1 %
Lymphocytes Relative: 44 %
Lymphs Abs: 4.3 10*3/uL — ABNORMAL HIGH (ref 0.7–4.0)
MCH: 31.3 pg (ref 26.0–34.0)
MCHC: 31.7 g/dL (ref 30.0–36.0)
MCV: 98.5 fL (ref 80.0–100.0)
Monocytes Absolute: 1 10*3/uL (ref 0.1–1.0)
Monocytes Relative: 10 %
Neutro Abs: 4.1 10*3/uL (ref 1.7–7.7)
Neutrophils Relative %: 43 %
Platelets: 213 10*3/uL (ref 150–400)
RBC: 3.36 MIL/uL — ABNORMAL LOW (ref 3.87–5.11)
RDW: 20.4 % — ABNORMAL HIGH (ref 11.5–15.5)
WBC: 9.6 10*3/uL (ref 4.0–10.5)
nRBC: 0 % (ref 0.0–0.2)

## 2020-10-30 LAB — COMPREHENSIVE METABOLIC PANEL
ALT: 27 U/L (ref 0–44)
AST: 39 U/L (ref 15–41)
Albumin: 2.2 g/dL — ABNORMAL LOW (ref 3.5–5.0)
Alkaline Phosphatase: 83 U/L (ref 38–126)
Anion gap: 8 (ref 5–15)
BUN: 15 mg/dL (ref 8–23)
CO2: 23 mmol/L (ref 22–32)
Calcium: 8.3 mg/dL — ABNORMAL LOW (ref 8.9–10.3)
Chloride: 105 mmol/L (ref 98–111)
Creatinine, Ser: 0.68 mg/dL (ref 0.44–1.00)
GFR, Estimated: >60 mL/min (ref >60)
Glucose, Bld: 101 mg/dL — ABNORMAL HIGH (ref 70–99)
Potassium: 4.1 mmol/L (ref 3.5–5.1)
Sodium: 136 mmol/L (ref 135–145)
Total Bilirubin: 0.5 mg/dL (ref 0.3–1.2)
Total Protein: 5.3 g/dL — ABNORMAL LOW (ref 6.5–8.1)

## 2020-10-30 LAB — PHOSPHORUS
Phosphorus: 2.4 mg/dL — ABNORMAL LOW (ref 2.5–4.6)
Phosphorus: 2.4 mg/dL — ABNORMAL LOW (ref 2.5–4.6)
Phosphorus: 2.5 mg/dL (ref 2.5–4.6)

## 2020-10-30 LAB — SARS CORONAVIRUS 2 (TAT 6-24 HRS): SARS Coronavirus 2: NEGATIVE

## 2020-10-30 LAB — MAGNESIUM
Magnesium: 1.7 mg/dL (ref 1.7–2.4)
Magnesium: 2.1 mg/dL (ref 1.7–2.4)
Magnesium: 2.1 mg/dL (ref 1.7–2.4)

## 2020-10-30 MED ORDER — MAGNESIUM SULFATE 2 GM/50ML IV SOLN
2.0000 g | Freq: Once | INTRAVENOUS | Status: AC
  Administered 2020-10-30: 2 g via INTRAVENOUS
  Filled 2020-10-30: qty 50

## 2020-10-30 MED ORDER — K PHOS MONO-SOD PHOS DI & MONO 155-852-130 MG PO TABS
500.0000 mg | ORAL_TABLET | Freq: Once | ORAL | Status: AC
  Administered 2020-10-30: 500 mg
  Filled 2020-10-30: qty 2

## 2020-10-30 MED ORDER — PROSOURCE PLUS PO LIQD
30.0000 mL | Freq: Every day | ORAL | Status: DC
  Administered 2020-11-01 – 2020-11-04 (×4): 30 mL via ORAL
  Filled 2020-10-30 (×5): qty 30

## 2020-10-30 MED ORDER — CHLORHEXIDINE GLUCONATE 0.12 % MT SOLN
15.0000 mL | Freq: Four times a day (QID) | OROMUCOSAL | Status: DC
  Administered 2020-10-30 – 2020-11-04 (×9): 15 mL via OROMUCOSAL
  Filled 2020-10-30 (×12): qty 15

## 2020-10-30 MED ORDER — FAMOTIDINE 40 MG/5ML PO SUSR
20.0000 mg | Freq: Every day | ORAL | Status: DC
  Administered 2020-10-30 – 2020-11-04 (×5): 20 mg
  Filled 2020-10-30 (×7): qty 2.5

## 2020-10-30 NOTE — Consult Note (Signed)
Department of Dental Medicine     INPATIENT CONSULTATION  Service Date:   10/30/2020 Admitted Date:  10/23/2020  Patient Name:  [name] [name] Date of Birth:   [birth year] Medical Record Number: [id number]  Referring Provider:              Chinita Pester, DO  PLAN & RECOMMENDATIONS   > There are no current signs of acute odontogenic infection including abscess, edema or erythema, or suspicious lesion requiring biopsy.  The patient does have 2 ulcerations on the inside of her lower lip from her 2 lower implants impinging on her soft tissue. >> Recommend placing wet 4x4 gauze on the inside of her lower lip to protect her mucosa from further trauma and allow the ulcerations to heal. Also recommend Chlorhexidine gluconate 0.12% mouthrinse's 2-3 times a day to aid in healing.  Sodium chloride rinses are okay too. >>> Plan to discuss case with medical team and coordinate treatment as needed.  >>  Recommend the patient establish care at a dental office of their choice for routine dental care including replacement of missing teeth, cleanings and exams. >>  Discussed in detail all treatment options with the patient and they are agreeable to the plan.   Thank you for consulting with Hospital Dentistry and for the opportunity to participate in this patient's treatment.  Should you have any questions or concerns, please contact the [facility] at [phone].   10/30/2020     CONSULT NOTE   COVID 19 SCREENING: The patient denies symptoms concerning for COVID-19 infection including fever, chills, cough, or newly developed shortness of breath.   HISTORY OF PRESENT ILLNESS: >> [name] [name] is a very pleasant 87 y.o. female with h/o rheumatoid arthritis, h/o DVT and PE, CAD, chronic prednisone who is currently admitted at [facility] for abdominal abscess.  Hospital dentistry was consulted to evaluate the patient for mouth pain.  DENTAL HISTORY: >The patient reports that she has 2  area on the inside of her lower lip that have been hurting her and the past couple of days the pain has been so severe that she is unable to wear her upper and lower implant-supported dentures.  She says that she has gotten these sore areas before and usually uses warm salt water rinses to help. She currently denies any dental/orofacial pain or sensitivity. >> Patient is able to manage oral secretions.  Patient denies dysphagia, odynophagia, dysphonia, SOB and neck pain.  Patient denies fever, rigors and malaise.   CHIEF COMPLAINT:  Sore areas in mouth causing significant pain.   Patient Active Problem List   Diagnosis Date Noted  . Abdominal wall cellulitis 10/24/2020  . Severe sepsis ([place]) 10/23/2020  . Abdominal pain 10/20/2020  . Diarrhea 10/02/2020  . Pressure injury of skin 09/06/2020  . Acute on chronic respiratory failure with hypoxemia ([place]) 08/23/2020  . Protein-calorie malnutrition, severe 08/20/2020  . Fall 01/25/2020  . Other fatigue 01/03/2020  . Left shoulder pain 01/03/2020  . Urinary frequency 01/03/2020  . Acute otalgia, left 09/06/2019  . Unintentional weight loss 07/18/2019  . Personal history of PE (pulmonary embolism) 04/23/2019  . Headache 02/11/2019  . Iron deficiency anemia 12/21/2018  . Cold sore 10/23/2018  . Blood in stool 09/14/2018  . 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 Therapeutic drug monitoring 07/17/2018  . Chronic diastolic CHF (congestive heart failure) ([place]) 12/19/2017  . Rash 08/11/2017  . Chronic respiratory failure with hypoxia ([place]) 03/30/2017  . Angular cheilitis 03/08/2017  .  Leg pain 10/25/2016  . Back pain 08/05/2016  . RA (rheumatoid arthritis) ([place]) 03/31/2015  . Allergic rhinitis   . Varicose vein 09/11/2014  . ILD (interstitial lung disease) ([place]) 06/25/2012  . Chest pain 02/27/2012  . CAD (coronary artery disease) 02/27/2012  . Pruritus 08/18/2009  . Postinflammatory pulmonary fibrosis / RA ILD  06/30/2008  . Constipation  01/03/2008  . Dyslipidemia 06/16/2007   Past Medical History:  Diagnosis Date  . Allergic rhinitis   . Allergy   . CAD (coronary artery disease)    Mild CAD by cath 2008  . CHF (congestive heart failure) ([place])   . DJD (degenerative joint disease)    rheumatoid  . DVT (deep venous thrombosis) ([place])   . Dyslipidemia   . History of echocardiogram    Echo 12/2018: EF 60-65, mod asymmetric LVH, Gr 1 DD  . History of nuclear stress test    Myoview 5/17:  EF 74%, normal perfusion, low risk // Myoview 12/2018:  EF 89, very mild ischemia in inferoapical wall; Low Risk  . History of pulmonary embolism   . Hyperlipidemia   . Insomnia   . Osteoporosis   . Psoriasis   . Pulmonary fibrosis ([place])   . Rheumatoid arthritis [facility])    Past Surgical History:  Procedure Laterality Date  . ABDOMINAL HYSTERECTOMY  1974  . APPENDECTOMY  1959  . CATARACT EXTRACTION    . IR REPLC GASTRO/COLONIC TUBE PERCUT W/FLUORO  10/29/2020  . LAPAROTOMY N/A 08/23/2020   Procedure: EXPLORATORY LAPAROTOMY small bowel resection gastrostomy tube placement;  Surgeon: Dwan Bolt, MD;  Location: [facility] ORS;  Service: General;  Laterality: N/A;  . LEFT HEART CATHETERIZATION WITH CORONARY ANGIOGRAM N/A 06/18/2013   Procedure: LEFT HEART CATHETERIZATION WITH CORONARY ANGIOGRAM;  Surgeon: Peter M Martinique, MD;  Location: [facility] CATH LAB;  Service: Cardiovascular;  Laterality: N/A;  . TONSILLECTOMY  1941, 1951  . TOTAL KNEE ARTHROPLASTY  1997, 2007   Bilateral   Allergies  Allergen Reactions  . Crestor [Rosuvastatin] Other (See Comments)    Muscle pain  . Lactose Intolerance (Gi) Other (See Comments)    Stomach upset  . Penicillins Hives  . Imuran [Azathioprine] Nausea And Vomiting   Current Facility-Administered Medications  Medication Dose Route Frequency Provider Last Rate Last Admin  . (feeding supplement) PROSource Plus liquid 30 mL  30 mL Oral TID BM Patrecia Pour, MD   30 mL at 10/30/20 0847  . acetaminophen (TYLENOL)  tablet 650 mg  650 mg Oral Q6H PRN Minda Ditto, RPH   650 mg at 10/30/20 [id number]   Or  . acetaminophen (TYLENOL) suppository 650 mg  650 mg Rectal Q6H PRN Nyoka Cowden, Terri L, RPH      . benzocaine (ORAJEL) 10 % mucosal gel   Mouth/Throat QID PRN Donalynn Furlong, NP   1 application at [id number] [phone]  . diphenhydrAMINE (BENADRYL) injection 12.5 mg  12.5 mg Intravenous Q6H PRN Sheikh, Omair Latif, DO      . enoxaparin (LOVENOX) injection 30 mg  30 mg Subcutaneous Q24H Green, Terri L, RPH   30 mg at 10/30/20 0847  . famotidine (PEPCID) 40 MG/5ML suspension 20 mg  20 mg Per Tube Daily Adrian Saran, RPH   20 mg at 10/30/20 1012  . feeding supplement (BOOST / RESOURCE BREEZE) liquid 1 Container  1 Container Oral TID BM Patrecia Pour, MD   1 Container at 10/30/20 0848  . feeding supplement (OSMOLITE 1.2 CAL) liquid 1,000 mL  1,000 mL Per Tube Continuous Raiford Noble [place], DO 20 mL/hr at 10/29/20 2228 1,000 mL at 10/29/20 2228  . fluconazole (DIFLUCAN) tablet 100 mg  100 mg Oral Daily Patrecia Pour, MD   100 mg at 10/30/20 0846  . fluticasone (FLONASE) 50 MCG/ACT nasal spray 2 spray  2 spray Each Nare Daily PRN Patrecia Pour, MD      . Gerhardt's butt cream   Topical BID Saverio Danker, PA-C   Given at 10/30/20 0845  . HYDROmorphone (DILAUDID) injection 0.5-1 mg  0.5-1 mg Intravenous Q4H PRN Patrecia Pour, MD   1 mg at 10/28/20 0751  . lipase/protease/amylase (CREON) capsule 36,000 Units  36,000 Units Oral TID Charna Archer Patrecia Pour, MD   36,000 Units at 10/30/20 0845  . magic mouthwash w/lidocaine  5 mL Oral TID Patrecia Pour, MD   5 mL at 10/30/20 0845  . mirtazapine (REMERON SOL-TAB) disintegrating tablet 15 mg  15 mg Oral QHS Patrecia Pour, MD   15 mg at 10/28/20 2106  . multivitamin with minerals tablet 1 tablet  1 tablet Oral Daily Patrecia Pour, MD   1 tablet at 10/30/20 [phone]  . mycophenolate (CELLCEPT) capsule 1,000 mg  1,000 mg Oral BID Patrecia Pour, MD   1,000 mg at 10/30/20 0845  . Nintedanib (Ofev)  CAPS 100 mg  100 mg Oral BID Minda Ditto, RPH   100 mg at 10/30/20 0844  . ondansetron (ZOFRAN) tablet 4 mg  4 mg Oral Q6H PRN Patrecia Pour, MD       Or  . ondansetron [facility]) injection 4 mg  4 mg Intravenous Q6H PRN Patrecia Pour, MD      . predniSONE (DELTASONE) tablet 5 mg  5 mg Oral Q breakfast Patrecia Pour, MD   5 mg at 10/30/20 0842  . sodium chloride 0.9 % bolus 500 mL  500 mL Intravenous Once [place], DO        LABS: Lab Results  Component Value Date   WBC 9.6 10/30/2020   HGB 10.5 (L) 10/30/2020   HCT 33.1 (L) 10/30/2020   MCV 98.5 10/30/2020   PLT 213 10/30/2020      Component Value Date/Time   NA 136 10/30/2020 0510   NA 138 04/29/2020 0948   K 4.1 10/30/2020 0510   CL 105 10/30/2020 0510   CO2 23 10/30/2020 0510   GLUCOSE 101 (H) 10/30/2020 0510   BUN 15 10/30/2020 0510   BUN 11 04/29/2020 0948   CREATININE 0.68 10/30/2020 0510   CREATININE 0.74 11/30/2015 1327   CALCIUM 8.3 (L) 10/30/2020 0510   GFRNONAA >60 10/30/2020 0510   GFRAA 69 04/29/2020 0948   Lab Results  Component Value Date   INR 1.0 10/23/2020   INR 1.0 09/30/2020   INR 2.2 01/28/2014   No results found for: PTT  Social History   Socioeconomic History  . Marital status: Widowed    Spouse name: Not on file  . Number of children: 2  . Years of education: Not on file  . Highest education level: Not on file  Occupational History  . Occupation: Publishing rights manager    Comment: retired  Tobacco Use  . Smoking status: Former Smoker    Packs/day: 0.50    Years: 20.00    Pack years: 10.00    Types: Cigarettes    Quit date: 07/11/1973    Years since quitting: 47.3  . Smokeless tobacco: Never  Used  Vaping Use  . Vaping Use: Never used  Substance and Sexual Activity  . Alcohol use: No  . Drug use: No  . Sexual activity: Never  Other Topics Concern  . Not on file  Social History Narrative   Diet:   Do you drink/eat things with caffeine? Yes   Marital  status:        Widowed                      What year were you married?1954   Do you live in a house, apartment, assisted living, condo, trailer, etc)? House   Is it one or more stories? 1 1/4   How many persons live in your home?1   Do you have any pets in your home? No   Current or past profession:  Publishing rights manager   Do you exercise?        Yes                                            Type & how often:  Walk                                    Do you have a living will? Yes   Do you have a DNR Form? Yes   Do you have a POA/HPOA forms?  Yes   Social Determinants of Health   Financial Resource Strain: Low Risk   . Difficulty of Paying Living Expenses: Not hard at all  Food Insecurity: No Food Insecurity  . Worried About Charity fundraiser in the Last Year: Never true  . Ran Out of Food in the Last Year: Never true  Transportation Needs: No Transportation Needs  . Lack of Transportation (Medical): No  . Lack of Transportation (Non-Medical): No  Physical Activity: Not on file  Stress: Not on file  Social Connections: Moderately Integrated  . Frequency of Communication with Friends and Family: More than three times a week  . Frequency of Social Gatherings with Friends and Family: More than three times a week  . Attends Religious Services: 1 to 4 times per year  . Active Member of Clubs or Organizations: Yes  . Attends Archivist Meetings: Never  . Marital Status: Widowed  Intimate Partner Violence: Not on file   Family History  Problem Relation Age of Onset  . Kidney disease Daughter 5  . Cancer Daughter   . Heart attack Father 86  . Alzheimer's disease Sister   . Heart disease Sister   . Alzheimer's disease Sister     REVIEW OF SYSTEMS: Reviewed with the patient as per HPI. PSYCH: Patient denies having dental phobia.  VITAL SIGNS: BP (!) 98/56 (BP Location: Right Arm)   Pulse 88   Temp 98 F (36.7 C) (Oral)   Resp 20   Ht 5' 4.5" (1.638 m)    Wt 40.2 kg   SpO2 95%   BMI 14.98 kg/m    PHYSICAL EXAM: >> General:  Well-developed, comfortable and in no apparent distress. >> Neurological:  Alert and oriented to person, place and  time. >> Extraoral:  Facial symmetry present without any edema or erythema.  (+) Mild swelling of the mental region underneath the lower lip; bilateral. Not  warm upon palpation or erythematous. TMJ asymptomatic without clicks or crepitations. >> Intraoral:  Soft tissues appear well-perfused and mucous membranes moist.  FOM and vestibules soft and not raised. Oral cavity without mass or lesion.  (+) 2 areas on the inner lower lip mucosa adjacent to 2 implants which are ulcerations due to implants cutting into the soft tissues.  No signs of infection, purulence or edema surrounding the traumatized mucosa.  Lower lip and upper lip are very sunken in and there is very little support due to bone loss and severe loss of VDO.   DENTAL EXAM: All clinical findings charted.   >> Dentition:  Completely edentulous maxilla and mandible.    >> Removable/Fixed Prosthodontics: 4 implants that are clinically visible; 2 in the maxilla and 2 in the mandible. The patient reports having an upper and lower implant-supported denture, but is unable to wear them right now due to her mouth hurting. >> Occlusion: Unable to assess occlusion.  Severe loss of VDO and atrophy of edentulous ridges.   RADIOGRAPHIC EXAM:  10/29/20 CT Maxillofacial w/ Contrast: CLINICAL DATA:  Maxillofacial pain. Additional history provided: Thrush, mouth pain. Mouth pain also related to dental implants screws with irritation. Lip swelling. EXAM: CT MAXILLOFACIAL WITH CONTRAST TECHNIQUE: Multidetector CT imaging of the maxillofacial structures was performed with intravenous contrast. Multiplanar CT image reconstructions were also generated. CONTRAST:  31mL OMNIPAQUE IOHEXOL 300 MG/ML  SOLN COMPARISON:  CT head 10/24/2020.  FINDINGS: Osseous: The patient is  edentulous. There are several metallic dental prostheses. No acute bony abnormality or aggressive osseous lesion. Orbits: No acute or significant orbital finding. The globes are normal in size and contour. The extraocular muscles and optic nerve sheath complexes are symmetric and unremarkable. No intraorbital mass or abnormal intraorbital enhancement. Sinuses: Moderate volume frothy secretions and fluid level within the left sphenoid sinus. The paranasal sinuses are otherwise normally aerated. Soft tissues: There is fairly diffuse mucosal enhancement within the oral cavity. Findings likely reflect sequela of reported thrush/mucositis. No appreciable mass within the oral cavity, pharynx or larynx. Limited intracranial: Redemonstrated cerebral atrophy and chronic small vessel ischemic disease. No evidence of acute intracranial abnormality within the field of view. IMPRESSION: Fairly diffuse mucosal enhancement within the oral cavity. Findings likely reflect sequela of reported thrush/mucositis. The patient is edentulous, although there are several metallic dental prostheses. Left sphenoid sinusitis.   ASSESSMENT:  1. Encounter for dental exam 2. Abdominal wall cellulitis 3. Missing teeth 4. Atrophy of edentulous alveolar ridge 5. Traumatic ulceration 6. Facial swelling 7. Postoperative bleeding risk   PLAN AND RECOMMENDATIONS: > I discussed the risks, benefits, and complications of various scenarios with the patient in relationship to their medical and dental conditions.  I explained all significant findings of the dental evaluation with the patient including the two sore spots/ulcers on her lower lip being from her lip sinking into her metal implants and causing trauma to her mucosa and the recommended care including Peridex mouthrinse's, warm salt water rinses ans placing wet gauze in between her lower lip and her implants to allow her lip to heal and so she will be able to wear her dentures again  which will protect her lip from impinging on the implants. >> We then discussed various treatment options to include no treatment and finding an outside dental provider of her choice for routine dental exams and replacement of missing teeth as indicated upon discharge and recovery.  The patient verbalized understanding of all options, and currently wishes to proceed  with current recommendations for treatment while she is admitted. >>>  Plan to discuss all findings and recommendations with medical team and coordinate future care as needed.   <> The patient tolerated today's visit well.  All questions and concerns were addressed and answered before conclusion of the consultation.  Opelika Benson [place], D.M.D.

## 2020-10-30 NOTE — Progress Notes (Signed)
Rehab Admissions Coordinator Note:  Patient was screened by Cleatrice Burke for appropriateness for an Inpatient Acute Rehab Consult per therapy change in recommendation. .  At this time, we are recommending Inpatient Rehab consult. I will place order per protocol.  Cleatrice Burke RN MSN 10/30/2020, 9:19 PM  I can be reached at [phone].

## 2020-10-30 NOTE — Progress Notes (Signed)
PROGRESS NOTE    [name] [name]  [id number] DOB: 11/01/1930 DOA: 10/23/2020 PCP: Hoyt Koch, MD  Brief Narrative:  The patient is an 87 year old elderly female with a past medical history significant for but not limited to UIP on 2 L of supplemental oxygen via nasal cannula, chronic prednisone, rheumatoid arthritis, history of DVT and PE, mild CAD, history of SBO status post exploratory lap in February 2022 with G-tube placement who has since had it removed who returned to the ED on 10/23/2020 with worsening abdominal pain and wound drainage.  She was admitted for a few weeks in February and March [id number] for complications after ex lap requiring TPN and G-tube placement which was then ultimately removed.  Patient discharged to SNF for less than 2 weeks and then returned home with 24-hour care with close surgery follow-up.  She was then referred to wound care center and has been having a wound RN from [facility] performing twice daily wound dressings.  She is continued struggle for weeks with poor appetite, and now odynophagia and symptoms of fungal infection last few days she has been worsening with generalized weakness.  A superficial culture was taken of the wound and showed polymicrobial infection with plans to treat with Bactrim though the family stated that she is unlikely to swallow pills as it would upset her stomach.  In the ED she is noted to be tachypneic and tachycardic and found to be septic and given a sepsis protocol 30 cc/kg.  General surgery evaluated and managed and admitted this patient.  Currently she is status post 5 days of antibiotics and wound VAC has been applied to her mid abdominal wound and a G-tube is planned for placement today if possible.  Her appetite is extremely poor.  Palliative care has been called for goals of care discussion however family is clear in her wishes and want everything done currently.  PEG tube was replaced yesterday and dentistry evaluated her  mouth pain.  General surgery consulted dietitians for bolus feedings however dietitians do not feel that the goal to tolerate it so they are recommending continuing continuous feeds at this time with orders placed. Dentistry evaluated her mouth pain and they feel that her lower lip is sinking in her to lower implants causing ulcerations on both sides of her inner lip and they are recommending starting chlorhexidine rinses 3-4 times a day with swish and spit and using salt water rinses and allowing the patient to have 4 x 4 gauze to be placed on the upper side of her lip.  Repeat is negative for COVID.  Patient appears stable and labs were stable and she she will go to SNF once bed is available and insurance authorization obtained.  Assessment & Plan:   Principal Problem:   Abdominal wall cellulitis Active Problems:   CAD (coronary artery disease)   RA (rheumatoid arthritis) (HCC)   Chronic respiratory failure with hypoxia (HCC)   Chronic diastolic CHF (congestive heart failure) (HCC)   Iron deficiency anemia   Personal history of PE (pulmonary embolism)   Unintentional weight loss   ILD (interstitial lung disease) (HCC)   Protein-calorie malnutrition, severe   Severe sepsis (HCC)  Severe Sepsis, poA, improved -The patient was tachypneic, tachycardic with leukocytosis and increasing drainage from abdominal wound suggestive of cellulitis. Lactic acid 2.7 indicating end organ dysfunction. -Aim to complete antibiotics after 5 days. Based on culture data and hoping to minimize nephrotoxins (e.g. zosyn, vancomycin), will continue bactrim > changed to PO  now that po intake improving. Bactrim is now complte -Continuing to  Monitor culture data. -She is Afebrile but WBC went up  Briefly and is now gone from 9.5 -> 11.9 -> 10.7 but has now resolved at 9.6  Thrush, mouth pain:  -Continue magic mouthwash, diflucan.  -C/w Benzocaine Mouth/Throat Pain 4 Times Daily prN -Mouth pain also related to  dental implant screws with irritation.  -Recommend dentistry follow up as outpatient however pain is severe so have asked Dr. Benson Norway to evaluate; Dr. Benson Norway evaluated and felt that her lower lip is sinking in her to lower implants causing ulcerations on both sides of her inner lip and they are recommending starting chlorhexidine rinses 3-4 times a day with swish and spit and using salt water rinses and allowing the patient to have 4 x 4 gauze to be placed on the upper side of her lip. -Given pain and because her lip started swelling yesterday will get CT Maxillofacial to evaluate further -CT maxillofacial showed "Fairly diffuse mucosal enhancement within the oral cavity. Findings likely reflect sequela of reported thrush/mucositis. The patient is edentulous, although there are several metallic dental prostheses. Left sphenoid sinusitis"  -Lip Swelling improved with low dose Benadryl -Appreciate dentistry evaluation  Nonhealing abdominal wounds, MASD:  -Unable to stop prednisone. Remains on 5 mg po Daily with Breakfast  -Optimize nutrition. Initiate tube feeds if and once G tube inserted. - Wound care per general surgery > initiate wound vac   - Continue with barrier cream but Surgery has increased this to BID  Dehydration Severe protein-calorie malnutrition, failure to thrive, generalized weakness:  -Appears emaciated. Ketonuria without glucosuria.  -Dietitian consulted. Prealbumin is 8. Plan to consult IR for G tube reinsertion and initiate tube feeds once able and hopefully done today through the old G-Tube site.  Per surgery if they are unable to place it to the side they will defer further placement as they do not want another hole as she will not heal this either due to her poor healing abilities as she is no longer surgical candidate given her failure to thrive and severe pulmonary fibrosis; Unfortunately unable to be done yesterday due to scheduling so will be done today at 1500 -SLP  evaluation > mild aspiration risk, unable to chew so dysphagia 1 diet, thin liquids recommended. No dysphagia noted.  -C/w Creon 36,000 units po TID before meals  -C/w with Mirtazepine 15 gmg po qHS for appetite stimulation. -Continued IVF and now stopped. Last EF preserved, indeterminate diastolic function, and no evidence of volume overload.  -C/w Prosource Pluse 30 mL po TID and Boost Breeze 1 Contatiner po TID between meals; Per Dietitian "if IR able to place PEG, recommend Osmolite 1.2 @ 20 ml/hr to advance by 10 ml every 24 hours to reach goal rate of 50 ml/hr with 45 ml Prosource TF once/day and 100 ml free water QID."  Tube feedings have been resumed and general surgery wanted to place the patient on bolus feedings however dietary does not feel that the patient will be able to tolerate this -PT, OT, TOC consulted -Palliative Consulted for GOC Discussion and will continue current plan of care  Acute Delirium -Improved. -C/w Delirium precautions, prefer to avoid sedating agents as much as possible.  Hyperkalemia, improved AKI, improved -Improved.  -Lokelma given with resolution. -Initiated on IVF but this has now been discontinued -Patient's potassium this morning 4.3 -Patient's BUN/Creatinine is now stable at 15/0.68 -Will need continued monitoring while on bactrim. -Avoid nephrotoxic medications,  contrast dyes, hypotension and renally dose medications -Repeat CMP in the AM   Hypokalemia, hypomagnesemia, hypophosphatemia:  -At risk of refeeding syndrome, continue checking regularly and supplementing as indicated. -Her K+ is now 4.1, mag level is 1.7 and Phos Level today was 2.4 -Electrolytes are being repleted -Continue monitor and trend and replete as necessary -Repeat CMP in a.m.  Chronic Hypoxic Respiratory Failure due to UIP:  -No infiltrate on CXR. At baseline. Included portions of lungs on CT abd are stable without acute findings. - Continue 2L O2. -SpO2: 95 % O2  Flow Rate (L/min): 4 L/min and weaned to home regimen -Continue prednisone -Restarted Mycophenolate 1000 mg po BID and Nintendanib on 10/27/20 given low suspicion for severe infection.  Goals of Care -These were introduced at admission; Given the combination of malnutrition, declining functional status, advanced age, and chronic immunosuppression suggest these wounds may never heal. The patient and family are very clear in their wishes at this time, however. They would like any and all available aggressive interventions with curative intent. This includes prolonged TPN, intensive wound care and physical therapy, IV antibiotics, and hospital monitoring.  -Full code confirmed. -Palliative care following as inpatient and Dr. Domingo Cocking to weigh in. They had an appointment with [facility] as outpatient pending.  Right Foot Drop:  -CT head without acute intracranial abnormality. Suspect stretch peroneal nerve palsy.  -PT/OT recommending SNF  Gallbladder distention, CBD Dilatation:  -Stable appearance on CT abd/pelvis 4/16 without inflammatory findings. LFTs wnl and no focal pain overlying this area. No further evaluation currently planned. -Follow up as an outpatient   Elevated AST -Mild and likely reactive -Patient's AST is now 23 yesterday and improved to 39 -Continue monitor and trend and if necessary will obtain a right upper quadrant ultrasound -Repeat CMP in the AM   COVID-19 Exposure -Per Dr. Chauncey Reading reported tested postive SARS-CoV-2 on 4/19 and last contact with the patient was 4/18 -Patient is Fully Vaccinated and Screend Negative on 4/15/ -ID recommendations per Dr. Baxter Flattery:   "Pt should wear mask ideally all the time, though with her chronic hypoxic respiratory failure, would recommend at least while caregivers are in the room. Door should remain closed.  - No aerosolizing medications are ordered and no aerosolizing procedures are scheduled.  - No PPE/precautions need to be  implemented for staff unless patient becomes symptomatic.  - Of course, no further visitation allowable for HCPOA. - Retest the patient for covid 3 days after last exposure: This would be on 4/21." -Retesting of her COVID 19 was negative  DVT prophylaxis: Enoxaparin 30 mg sq q24h Code Status: FULL CODE  Family Communication: No family present at bedside  Disposition Plan: Pending further clinical improvement and anticipate discharging to skilled nursing facility next 24 to 48 hours  Status is: Inpatient  Remains inpatient appropriate because:Unsafe d/c plan, IV treatments appropriate due to intensity of illness or inability to take PO and Inpatient level of care appropriate due to severity of illness   Dispo: The patient is from: Home              Anticipated d/c is to: SNF              Patient currently is not medically stable to d/c.   Difficult to place patient No  Consultants:   General Surgery  Palliative Care  Interventional Radiology   Procedures:   Procedure: 20 fr G tube placed through existing access.  Antimicrobials:  Anti-infectives (From admission, onward)   Start  Dose/Rate Route Frequency Ordered Stop   10/26/20 1300  fluconazole (DIFLUCAN) tablet 100 mg        100 mg Oral Daily 10/26/20 1145     10/26/20 1230  sulfamethoxazole-trimethoprim (BACTRIM DS) 800-160 MG per tablet 1 tablet        1 tablet Oral Every 12 hours 10/26/20 1145 10/27/20 2219   10/24/20 1600  fluconazole (DIFLUCAN) IVPB 100 mg  Status:  Discontinued        100 mg 50 mL/hr over 60 Minutes Intravenous Every 24 hours 10/23/20 2009 10/26/20 1145   10/24/20 1000  sulfamethoxazole-trimethoprim (BACTRIM) 160 mg in dextrose 5 % 250 mL IVPB  Status:  Discontinued        160 mg 260 mL/hr over 60 Minutes Intravenous Every 12 hours 10/23/20 2025 10/26/20 1145   10/23/20 2100  sulfamethoxazole-trimethoprim (BACTRIM) 160 mg in dextrose 5 % 250 mL IVPB        160 mg 260 mL/hr over 60 Minutes  Intravenous  Once 10/23/20 2025 10/23/20 2220   10/23/20 1715  cefTRIAXone (ROCEPHIN) 2 g in sodium chloride 0.9 % 100 mL IVPB        2 g 200 mL/hr over 30 Minutes Intravenous  Once 10/23/20 1704 10/23/20 1805   10/23/20 1715  metroNIDAZOLE (FLAGYL) IVPB 500 mg  Status:  Discontinued        500 mg 100 mL/hr over 60 Minutes Intravenous  Once 10/23/20 1704 10/23/20 1937   10/23/20 1545  fluconazole (DIFLUCAN) tablet 200 mg        200 mg Oral  Once 10/23/20 1541 10/23/20 1608        Subjective: Seen and examined at bedside and was resting still complaining of mouth pain.  She denies any abdominal pain.  No lightheadedness or dizziness.  No other concerns or complaints at this time.  Objective: Vitals:   10/29/20 2306 10/30/20 0113 10/30/20 0500 10/30/20 0914  BP: (!) 89/61 99/64 (!) 99/59 (!) 98/56  Pulse: 82 79 83 88  Resp: (!) 21 (!) 21 20 20   Temp: 98.4 F (36.9 C) 98.6 F (37 C) 99 F (37.2 C) 98 F (36.7 C)  TempSrc:    Oral  SpO2: 97% 95% 94% 95%  Weight:   40.2 kg   Height:        Intake/Output Summary (Last 24 hours) at 10/30/2020 1355 Last data filed at 10/30/2020 1155 Gross per 24 hour  Intake 426.67 ml  Output 150 ml  Net 276.67 ml   Filed Weights   10/28/20 0500 10/29/20 0517 10/30/20 0500  Weight: 40.5 kg 40.1 kg 40.2 kg   Examination: Physical Exam:  Constitutional: Extremely thin and cachectic elderly female laying in bed and is a little bit somnolent and drowsy but easily arousable and complaining about some mouth and jaw pain Eyes: Lids and conjunctivae normal, sclerae anicteric  ENMT: External Ears, Nose appear normal. Grossly normal hearing.  Neck: Appears normal, supple, no cervical masses, normal ROM, no appreciable thyromegaly; no JVD Respiratory: Diminished to auscultation bilaterally with coarse breath sounds, no wheezing, rales, rhonchi or crackles. Normal respiratory effort and patient is not tachypenic. No accessory muscle use.  Has unlabored  breathing but wearing 4 L of supplemental oxygen via nasal cannula Cardiovascular: RRR, no murmurs / rubs / gallops. S1 and S2 auscultated. No extremity edema.  Abdomen: Soft, non-tender, non-distended.  PEG in place and has a midline abdominal incision connected to wound VAC bowel sounds positive.  GU: Deferred. Musculoskeletal: No  clubbing / cyanosis of digits/nails. No joint deformity upper and lower extremities.  Skin: No rashes, lesions, ulcers on limited skin evaluation. No induration; Warm and dry.  Neurologic: CN 2-12 grossly intact with no focal deficits. Romberg sign and cerebellar reflexes not assessed.  Psychiatric: Normal judgment and insight. Alert and oriented x 3. Normal mood and appropriate affect.   Data Reviewed: I have personally reviewed following labs and imaging studies  CBC: Recent Labs  Lab 10/23/20 1558 10/24/20 0519 10/28/20 0852 10/29/20 0508 10/30/20 0510  WBC 11.4* 9.5 11.9* 10.7* 9.6  NEUTROABS 5.1  --  3.5 4.0 4.1  HGB 11.9* 12.0 11.8* 11.5* 10.5*  HCT 38.7 38.6 37.0 36.9 33.1*  MCV 101.0* 99.5 98.7 98.7 98.5  PLT 326 227 225 223 419   Basic Metabolic Panel: Recent Labs  Lab 10/26/20 0453 10/27/20 0535 10/28/20 0458 10/28/20 0852 10/29/20 0508 10/30/20 0510  NA 138 139 138  --  136 136  K 4.1 3.9 3.9  --  4.3 4.1  CL 99 104 101  --  102 105  CO2 28 29 28   --  26 23  GLUCOSE 76 82 83  --  77 101*  BUN 10 10 15   --  11 15  CREATININE 0.68 0.66 0.63  --  0.62 0.68  CALCIUM 8.2* 8.5* 8.5*  --  8.6* 8.3*  MG 2.0 1.9 1.8 1.7 1.9 1.7  PHOS 2.9 2.5 2.3* 2.5 2.6 2.4*   GFR: Estimated Creatinine Clearance: 30.8 mL/min (by C-G formula based on SCr of 0.68 mg/dL). Liver Function Tests: Recent Labs  Lab 10/23/20 1558 10/24/20 0519 10/29/20 0508 10/30/20 0510  AST 32 17 57* 39  ALT 15 11 32 27  ALKPHOS 121 106 94 83  BILITOT 0.9 0.6 0.5 0.5  PROT 7.0 5.5* 5.7* 5.3*  ALBUMIN 2.7* 2.1* 2.2* 2.2*   No results for input(s): LIPASE, AMYLASE  in the last 168 hours. No results for input(s): AMMONIA in the last 168 hours. Coagulation Profile: Recent Labs  Lab 10/23/20 1558  INR 1.0   Cardiac Enzymes: No results for input(s): CKTOTAL, CKMB, CKMBINDEX, TROPONINI in the last 168 hours. BNP (last 3 results) Recent Labs    06/03/20 1001 09/29/20 1425  PROBNP 100 58.0   HbA1C: No results for input(s): HGBA1C in the last 72 hours. CBG: No results for input(s): GLUCAP in the last 168 hours. Lipid Profile: No results for input(s): CHOL, HDL, LDLCALC, TRIG, CHOLHDL, LDLDIRECT in the last 72 hours. Thyroid Function Tests: No results for input(s): TSH, T4TOTAL, FREET4, T3FREE, THYROIDAB in the last 72 hours. Anemia Panel: No results for input(s): VITAMINB12, FOLATE, FERRITIN, TIBC, IRON, RETICCTPCT in the last 72 hours. Sepsis Labs: Recent Labs  Lab 10/23/20 1600 10/23/20 1958  LATICACIDVEN 2.7* 3.4*    Recent Results (from the past 240 hour(s))  Blood Culture (routine x 2)     Status: None   Collection Time: 10/23/20  3:50 PM   Specimen: Site Not Specified; Blood  Result Value Ref Range Status   Specimen Description   Final    SITE NOT SPECIFIED Performed at [facility] [address], [place], [place] [address]    Special Requests   Final    BOTTLES DRAWN AEROBIC AND ANAEROBIC Blood Culture results may not be optimal due to an inadequate volume of blood received in culture bottles Performed at [facility] [address] [place], [place] [address]    Culture   Final  NO GROWTH 5 DAYS Performed at [facility], [place] [address], [place], [place] [address]    Report Status 10/28/2020 FINAL  Final  Blood Culture (routine x 2)     Status: None   Collection Time: 10/23/20  4:00 PM   Specimen: BLOOD  Result Value Ref Range Status   Specimen Description   Final    BLOOD SITE NOT SPECIFIED Performed at [facility] [address], [place],  [place] [address]    Special Requests   Final    BOTTLES DRAWN AEROBIC AND ANAEROBIC Blood Culture results may not be optimal due to an inadequate volume of blood received in culture bottles Performed at [facility] [address], [place], [place] [address]    Culture   Final    NO GROWTH 5 DAYS Performed at [facility], [place] [address], [place],  [address]    Report Status 10/28/2020 FINAL  Final  Resp Panel by RT-PCR (Flu A&B, Covid) Nasopharyngeal Swab     Status: None   Collection Time: 10/23/20  5:34 PM   Specimen: Nasopharyngeal Swab; Nasopharyngeal(NP) swabs in vial transport medium  Result Value Ref Range Status   SARS Coronavirus 2 by RT PCR NEGATIVE NEGATIVE Final    Comment: (NOTE) SARS-CoV-2 target nucleic acids are NOT DETECTED.  The SARS-CoV-2 RNA is generally detectable in upper respiratory specimens during the acute phase of infection. The lowest concentration of SARS-CoV-2 viral copies this assay can detect is 138 copies/mL. A negative result does not preclude SARS-Cov-2 infection and should not be used as the sole basis for treatment or other patient management decisions. A negative result may occur with  improper specimen collection/handling, submission of specimen other than nasopharyngeal swab, presence of viral mutation(s) within the areas targeted by this assay, and inadequate number of viral copies(<138 copies/mL). A negative result must be combined with clinical observations, patient history, and epidemiological information. The expected result is Negative.  Fact Sheet for Patients:  EntrepreneurPulse.com.au  Fact Sheet for Healthcare Providers:  IncredibleEmployment.be  This test is no t yet approved or cleared by the [place] FDA and  has been authorized for detection and/or diagnosis of SARS-CoV-2 by FDA under an Emergency Use Authorization (EUA). This EUA will remain  in effect  (meaning this test can be used) for the duration of the COVID-19 declaration under Section 564(b)(1) of the Act, 21 U.S.C.section 360bbb-3(b)(1), unless the authorization is terminated  or revoked sooner.       Influenza A by PCR NEGATIVE NEGATIVE Final   Influenza B by PCR NEGATIVE NEGATIVE Final    Comment: (NOTE) The Xpert Xpress SARS-CoV-2/FLU/RSV plus assay is intended as an aid in the diagnosis of influenza from Nasopharyngeal swab specimens and should not be used as a sole basis for treatment. Nasal washings and aspirates are unacceptable for Xpert Xpress SARS-CoV-2/FLU/RSV testing.  Fact Sheet for Patients: EntrepreneurPulse.com.au  Fact Sheet for Healthcare Providers: IncredibleEmployment.be  This test is not yet approved or cleared by the [place] FDA and has been authorized for detection and/or diagnosis of SARS-CoV-2 by FDA under an Emergency Use Authorization (EUA). This EUA will remain in effect (meaning this test can be used) for the duration of the COVID-19 declaration under Section 564(b)(1) of the Act, 21 U.S.C. section 360bbb-3(b)(1), unless the authorization is terminated or revoked.  Performed at [facility], [place] [address] [place], [place] [address]   SARS CORONAVIRUS 2 (TAT 6-24 HRS)  Nasopharyngeal Nasopharyngeal Swab     Status: None   Collection Time: 10/30/20  1:53 AM   Specimen: Nasopharyngeal Swab  Result Value Ref Range Status   SARS Coronavirus 2 NEGATIVE NEGATIVE Final    Comment: (NOTE) SARS-CoV-2 target nucleic acids are NOT DETECTED.  The SARS-CoV-2 RNA is generally detectable in upper and lower respiratory specimens during the acute phase of infection. Negative results do not preclude SARS-CoV-2 infection, do not rule out co-infections with other pathogens, and should not be used as the sole basis for treatment or other patient management decisions. Negative results must be  combined with clinical observations, patient history, and epidemiological information. The expected result is Negative.  Fact Sheet for Patients: SugarRoll.be  Fact Sheet for Healthcare Providers: https://www.woods-mathews.com/  This test is not yet approved or cleared by the [place] FDA and  has been authorized for detection and/or diagnosis of SARS-CoV-2 by FDA under an Emergency Use Authorization (EUA). This EUA will remain  in effect (meaning this test can be used) for the duration of the COVID-19 declaration under Se ction 564(b)(1) of the Act, 21 U.S.C. section 360bbb-3(b)(1), unless the authorization is terminated or revoked sooner.  Performed at [facility], [place] [address], [place], [place] [address]    RN Pressure Injury Documentation: Pressure Injury 09/05/20 Sacrum Mid Stage 2 -  Partial thickness loss of dermis presenting as a shallow open injury with a red, pink wound bed without slough. (Active)  09/05/20 2150  Location: Sacrum  Location Orientation: Mid  Staging: Stage 2 -  Partial thickness loss of dermis presenting as a shallow open injury with a red, pink wound bed without slough.  Wound Description (Comments):   Present on Admission:      Pressure Injury 09/05/20 Sacrum Left Stage 2 -  Partial thickness loss of dermis presenting as a shallow open injury with a red, pink wound bed without slough. (Active)  09/05/20 2150  Location: Sacrum  Location Orientation: Left  Staging: Stage 2 -  Partial thickness loss of dermis presenting as a shallow open injury with a red, pink wound bed without slough.  Wound Description (Comments):   Present on Admission:    Estimated body mass index is 14.98 kg/m as calculated from the following:   Height as of this encounter: 5' 4.5" (1.638 m).   Weight as of this encounter: 40.2 kg.  Malnutrition Type:  Nutrition Problem: Severe Malnutrition Etiology: chronic  illness  Malnutrition Characteristics:  Signs/Symptoms: severe fat depletion,severe muscle depletion,percent weight loss Percent weight loss: 13.5 %  Nutrition Interventions:  Interventions: Tube feeding   Radiology Studies: IR Replc Gastro/Colonic Tube Percut W/Fluoro  Result Date: 10/30/2020 INDICATION: 87 year old woman status post gastrostomy tube placement in February, 2022 with subsequent removal presents today measure radiology for Ree insertion 4 supplemental nutrition requirements. EXAM: Gastrostomy tube Ree insertion through pre-existing access. MEDICATIONS: None ANESTHESIA/SEDATION: Versed 1.5 mg IV; Fentanyl 25 mcg IV Moderate Sedation Time:  10 minutes The patient was continuously monitored during the procedure by the interventional radiology nurse under my direct supervision. CONTRAST:  5 mL of Omnipaque 350-administered into the gastric lumen. FLUOROSCOPY TIME:  Fluoroscopy Time: 1 minutes 12 seconds (3 mGy). COMPLICATIONS: None immediate. PROCEDURE: Informed written consent was obtained from the patient after a thorough discussion of the procedural risks, benefits and alternatives. All questions were addressed. Maximal Sterile Barrier Technique was utilized including caps, mask, sterile gowns, sterile gloves, sterile drape, hand hygiene and skin antiseptic. A timeout was performed prior  to the initiation of the procedure. Following local ligament striation, the pre-existing opening in the left anterior abdominal wall was accessed with a Kumpe the catheter. Intraluminal positioning of the Kumpe the catheter tip was confirmed by administering contrast under fluoroscopy. Tract dilation was performed and 20 French gastrostomy tube was inserted. Contrast administered through the gastrostomy tube confirmed intraluminal positioning. The retention balloon was inflated with 6 mL of saline. IMPRESSION: Percutaneous gastrostomy tube re-insertion as above. Electronically Signed   By: Miachel Roux  M.D.   On: 10/30/2020 08:08   CT MAXILLOFACIAL W CONTRAST  Result Date: 10/29/2020 CLINICAL DATA:  Maxillofacial pain. Additional history provided: Thrush, mouth pain. Mouth pain also related to dental implants screws with irritation. Lip swelling. EXAM: CT MAXILLOFACIAL WITH CONTRAST TECHNIQUE: Multidetector CT imaging of the maxillofacial structures was performed with intravenous contrast. Multiplanar CT image reconstructions were also generated. CONTRAST:  59mL OMNIPAQUE IOHEXOL 300 MG/ML  SOLN COMPARISON:  CT head 10/24/2020. FINDINGS: Osseous: The patient is edentulous. There are several metallic dental prostheses. No acute bony abnormality or aggressive osseous lesion. Orbits: No acute or significant orbital finding. The globes are normal in size and contour. The extraocular muscles and optic nerve sheath complexes are symmetric and unremarkable. No intraorbital mass or abnormal intraorbital enhancement. Sinuses: Moderate volume frothy secretions and fluid level within the left sphenoid sinus. The paranasal sinuses are otherwise normally aerated. Soft tissues: There is fairly diffuse mucosal enhancement within the oral cavity. Findings likely reflect sequela of reported thrush/mucositis. No appreciable mass within the oral cavity, pharynx or larynx. Limited intracranial: Redemonstrated cerebral atrophy and chronic small vessel ischemic disease. No evidence of acute intracranial abnormality within the field of view. IMPRESSION: Fairly diffuse mucosal enhancement within the oral cavity. Findings likely reflect sequela of reported thrush/mucositis. The patient is edentulous, although there are several metallic dental prostheses. Left sphenoid sinusitis. Electronically Signed   By: Kellie Simmering DO   On: 10/29/2020 13:43    Scheduled Meds: . Derrill Memo ON 10/31/2020] (feeding supplement) PROSource Plus  30 mL Oral Daily  . enoxaparin (LOVENOX) injection  30 mg Subcutaneous Q24H  . famotidine  20 mg Per Tube  Daily  . feeding supplement  1 Container Oral TID BM  . fluconazole  100 mg Oral Daily  . Gerhardt's butt cream   Topical BID  . lipase/protease/amylase  36,000 Units Oral TID AC  . magic mouthwash w/lidocaine  5 mL Oral TID  . mirtazapine  15 mg Oral QHS  . multivitamin with minerals  1 tablet Oral Daily  . mycophenolate  1,000 mg Oral BID  . Nintedanib  100 mg Oral BID  . predniSONE  5 mg Oral Q breakfast   Continuous Infusions: . feeding supplement (OSMOLITE 1.2 CAL) 1,000 mL (10/29/20 2228)  . sodium chloride      LOS: 7 days   Kerney Elbe, DO [facility] PAGER is on [place]  If 7PM-7AM, please contact night-coverage [email or web address]

## 2020-10-30 NOTE — Progress Notes (Signed)
   10/30/20 0113  Assess: MEWS Score  Temp 98.6 F (37 C)  BP 99/64  Pulse Rate 79  Resp (!) 21  SpO2 95 %  Assess: MEWS Score  MEWS Temp 0  MEWS Systolic 1  MEWS Pulse 0  MEWS RR 1  MEWS LOC 0  MEWS Score 2  MEWS Score Color Yellow  Assess: if the MEWS score is Yellow or Red  Were vital signs taken at a resting state? Yes  Focused Assessment No change from prior assessment  Early Detection of Sepsis Score *See Row Information* Low  MEWS guidelines implemented *See Row Information* No, previously yellow, continue vital signs every 4 hours  Treat  Pain Scale 0-10  Pain Score 0  Faces Pain Scale 0  Take Vital Signs  Increase Vital Sign Frequency  Yellow: Q 2hr X 2 then Q 4hr X 2, if remains yellow, continue Q 4hrs  Escalate  MEWS: Escalate Yellow: discuss with charge nurse/RN and consider discussing with provider and RRT  Notify: Charge Nurse/RN  Name of Charge Nurse/RN Notified Emily, RN  Date Charge Nurse/RN Notified 10/30/20  Time Charge Nurse/RN Notified 0152  Document  Patient Outcome Stabilized after interventions  Progress note created (see row info) Yes

## 2020-10-30 NOTE — Consult Note (Signed)
[facility] Nurse wound follow up Wound type: non healing surgical wound Measurement: see previous notes Wound ZSM:OLMB areas 100% clean Drainage (amount, consistency, odor) none Periwound:scarring of the peristomal (Gtube) skin along the left lateral aspect of the abdomen Dressing procedure/placement/frequency: Removed old NPWT dressing Periwound skin protected with skin barrier wipe and with VAC drape between the two wounds.  Filled wound with  __2_ Sealed NPWT dressing at 140mm HG Patient receivedPO pain medication per bedside nurse prior to dressing change Patient tolerated procedure; however was much more active and resistant to me while I was trying to change dressings   IR in to assess new Gtube; we discussed the fact the bumper of the new tube is right under her prominate rib cage; she is so very thin.  Added 2x2 silicone foam dressings under bumper to potentially pad area from skin irritation and pressure on the rib cage   Victor will continue to provide NPWT dressing changed due to the complexity of the dressing change.   Discussed POC with patient and bedside nurse.  Re consult if needed, will not follow at this time. Thanks  Kamie Korber [employer], RN,CWOCN, CNS, [place] [phone])

## 2020-10-30 NOTE — Progress Notes (Signed)
Patient ID: [name] [name], female   DOB: 1931/03/08, 87 y.o.   MRN: [id number] Pt lethargic this am; s/p G tube replacement yesterday via preexisting tract;  arouses to stimulation, not very talkative; wound care nurse in room; afebrile,BP soft at 98/56; HR 88; WBC nl; hgb 10.5(11.5), creat nl; latest COVID test pend;  G tube intact, insertion site without sig change from previous with scarring noted, no increasing leakage with tube in place at present; wound care nurse to apply skin protectant at G tube site; tube ok to use; hydrate; minimize narcotic use; other plans as per CCS/TRH

## 2020-10-30 NOTE — Progress Notes (Signed)
   10/29/20 2306  Assess: MEWS Score  Temp 98.4 F (36.9 C)  BP (!) 89/61  Pulse Rate 82  Resp (!) 21  SpO2 97 %  Assess: MEWS Score  MEWS Temp 0  MEWS Systolic 1  MEWS Pulse 0  MEWS RR 1  MEWS LOC 0  MEWS Score 2  MEWS Score Color Yellow  Assess: if the MEWS score is Yellow or Red  Were vital signs taken at a resting state? Yes  Focused Assessment No change from prior assessment  Early Detection of Sepsis Score *See Row Information* Low  MEWS guidelines implemented *See Row Information* Yes  Treat  MEWS Interventions Other (Comment) (NS Bolus)  Pain Scale 0-10  Pain Score 0  Faces Pain Scale 0  Take Vital Signs  Increase Vital Sign Frequency  Yellow: Q 2hr X 2 then Q 4hr X 2, if remains yellow, continue Q 4hrs  Escalate  MEWS: Escalate Yellow: discuss with charge nurse/RN and consider discussing with provider and RRT  Notify: Charge Nurse/RN  Name of Charge Nurse/RN Notified Raquel Sarna, RN  Date Charge Nurse/RN Notified 10/29/20  Time Charge Nurse/RN Notified 2308  Notify: Provider  Provider Name/Title Jeannette Corpus, NP  Date Provider Notified 10/29/20  Time Provider Notified 2130  Notification Type Page  Notification Reason Critical result (Low BP)  Provider response See new orders  Date of Provider Response 10/29/20  Time of Provider Response 2213  Document  Patient Outcome Not stable and remains on department (IVF Bolus Infusing, was ordered for the same reason.)  Progress note created (see row info) Yes

## 2020-10-30 NOTE — Discharge Instructions (Signed)
Negative Pressure Wound Therapy Home Guide Negative pressure wound therapy (NPWT) uses a sponge or foam-like material (dressing) placed on or inside the wound. The wound is then covered and sealed with a cover dressing that sticks to your skin (is adhesive). This keeps air out. A tube is attached to the cover dressing, and this tube connects to a small pump. The pump sucks fluid and germs from the wound. NPWT helps to increase blood flow to the wound and heal it from the inside. What are the risks? NPWT is usually safe to use. However, problems can occur, including:  Skin irritation from the dressing adhesive.  Bleeding.  Infection.  Dehydration. Wounds with large amounts of drainage can cause excessive fluid loss.  Pain. Supplies needed:  A disposable garbage bag.  Soap and water, or hand sanitizer.  Wound cleanser or salt-water solution (saline).  New sponge and cover dressing.  Protective clothing.  Gauze pad.  Vinyl gloves.  Tape.  Skin protectant. This may be a wipe, film, or spray.  Clean or germ-free (sterile) scissors.  Eye protection. How to change your dressing Prepare to change your dressing 1. If told by your health care provider, take pain medicine 30 minutes before changing the dressing. 2. Wash your hands with soap and water. Dry your hands with a clean towel. If soap and water are not available, use hand sanitizer. 3. Set up a clean station for wound care. 4. Open the dressing package so that the sponge dressing remains on the inside of the package. 5. Wear gloves, protective clothing, and eye protection.   Remove old dressing 1. Turn off the pump and disconnect the tubing from the dressing. 2. Carefully remove the adhesive cover dressing in the direction of your hair growth. 3. Remove the sponge dressing that is inside the wound. If the sponge sticks, use a wound cleanser or saline solution to wet the sponge and help it come off more easily. 4. Throw  the old sponge and cover dressing supplies into the garbage bag. 5. Remove your gloves by grabbing the cuff and turning the glove inside out. Place the gloves in the trash immediately. 6. Wash your hands with soap and water. Dry your hands with a clean towel. If soap and water are not available, use hand sanitizer.   Clean your wound  Wear gloves, protective clothing, and eye protection. Follow your health care provider's instructions on how to clean your wound. You may be told to: 1. Clean the wound using a saline solution or a wound cleanser and a clean gauze pad. 2. Pat the wound dry with a gauze pad. Do not rub the wound. 3. Throw the gauze pad into the garbage bag. 4. Remove your gloves by grabbing the cuff and turning the glove inside out. Place the gloves in the trash immediately. 5. Wash your hands with soap and water. Dry your hands with a clean towel. If soap and water are not available, use hand sanitizer. Apply new dressing  Wear gloves, protective clothing, and eye protection. 1. If told by your health care provider, apply a skin protectant to any skin that will be exposed to adhesive. Let the skin protectant dry. 2. Cut a piece of new sponge dressing and put it on or in the wound. 3. Using clean scissors, cut a nickel-sized hole in the new cover dressing. 4. Apply the cover dressing. 5. Attach the suction tube over the hole in the cover dressing. 6. Take off your gloves. Put them  in the plastic bag with the old dressing. Tie the bag shut and throw it away. 7. Wash your hands with soap and water. Dry your hands with a clean towel. If soap and water are not available, use hand sanitizer. 8. Turn the pump back on. The sponge dressing should collapse. Do not change the settings on the machine without talking to a health care provider. 9. Replace the container in the pump that collects fluid if it is full. Replace the container per the manufacturer's instructions or at least once a  week, even if it is not full. General tips and recommendations If the alarm sounds:  Stay calm.  Do not turn off the pump or do anything with the dressing.  Reasons the alarm may go off: ? The battery is low. Change the battery or plug the device into electrical power. ? The dressing has a leak. Find the leak and put tape over the leak. ? The fluid collection container is full. Change the fluid container.  Call your health care provider right away if you cannot fix the problem.  Explain to your health care provider what is happening. Follow his or her instructions. General instructions  Do not turn off the pump unless told to do so by your health care provider.  Do not turn off the pump for more than 2 hours. If the pump is off for more than 2 hours, the dressing will need to be changed.  If your health care provider says it is okay to shower: ? Do not take the pump into the shower. ? Make sure the wound dressing is protected and sealed. The wound dressing must stay dry.  Check frequently that the machine indicates that therapy is on and that all clamps are open.  Do not use over-the-counter medicated or antiseptic creams, sprays, liquids, or dressings unless your health care provider approves. Contact a health care provider if:  You have new pain.  You develop irritation, a rash, or itching around the wound or dressing.  You see new black or yellow tissue in your wound.  The dressing changes are painful or cause bleeding.  The pump has been off for more than 2 hours, and you do not know how to change the dressing.  The pump alarm goes off, and you do not know what to do. Get help right away if:  You have a lot of bleeding.  The wound breaks open.  You have severe pain.  You have signs of infection, such as: ? More redness, swelling, or pain. ? More fluid or blood. ? Warmth. ? Pus or a bad smell. ? Red streaks leading from the wound. ? A fever.  You see a  sudden change in the color or texture of the drainage.  You have signs of dehydration, such as: ? Little or no tears, urine, or sweat. ? Muscle cramps. ? Very dry mouth. ? Headache. ? Dizziness. Summary  Negative pressure wound therapy (NPWT) is a device that helps your wound heal.  Set up a clean station for wound care. Your health care provider will tell you what supplies to use.  Follow your health care provider's instructions on how to clean your wound and how to change the dressing.  Contact a health care provider if you have new pain, an irritation, or a rash, or if the alarm goes off and you do not know what to do.  Get help right away if you have a lot of bleeding, your  wound breaks open, or you have severe pain. Also, get help if you have signs of infection. This information is not intended to replace advice given to you by your health care provider. Make sure you discuss any questions you have with your health care provider. Document Revised: 09/02/2019 Document Reviewed: 09/14/2018 Elsevier Patient Education  [place].   Gastrostomy Tube Replacement, Care After This sheet gives you information about how to care for yourself after your procedure. Your health care provider may also give you more specific instructions. If you have problems or questions, contact your health care provider. What can I expect after the procedure? After the procedure, it is common to have:  Mild pain in your abdomen.  A small amount of blood-colored fluid leaking from the site of your gastrostomy tube (G-tube) replacement. Follow these instructions at home:  Return to your normal activities as told by your health care provider.  You may return to your normal feedings.  Wash your hands for at least 20 seconds before and after caring for your G-tube.  Check the skin around your tube insertion site for: ? Redness. ? Irritation. ? Swelling. ? Drainage. ? Extra tissue  growth.  Care for your G-tube as you did before, or as told by your health care provider.  Keep all follow-up visits. This is important.   Contact a health care provider if:  You have a fever or chills.  You see any of these signs on the skin near your insertion site: ? Redness. ? Irritation. ? Swelling. ? Drainage. ? Extra tissue growth.  You continue to have pain in the abdomen or leaking around your G-tube. Get help right away if:  You develop bleeding or a lot of discharge around the tube.  You have severe pain in the abdomen.  Your new tube is not working properly.  You are unable to get feedings into the tube.  Your tube comes out for any reason. Summary  After the procedure, it is common to have mild pain in your abdomen and a small amount of blood-colored fluid.  Return to your normal activities and feedings as told by your health care provider.  Care for your gastrostomy tube, or G-tube, as you did before, or as told by your health care provider. This information is not intended to replace advice given to you by your health care provider. Make sure you discuss any questions you have with your health care provider. Document Revised: 11/14/2019 Document Reviewed: 11/14/2019 Elsevier Patient Education  [place].

## 2020-10-30 NOTE — TOC Progression Note (Signed)
Transition of Care [facility]) - Progression Note    Patient Details  Name: [name] [name] MRN: [id number] Date of Birth: May 31, 1931  Transition of Care [facility]) CM/SW [place], [place] Phone Number: 10/30/2020, 2:16 PM  Clinical Narrative:   Communicated with [facility] are unable to take patient with [facility] wound vac.  France Ravens at [facility] offered bed for Monday-likely. MD and patient informed.  Communicated with Olivia Mackie at [facility] re: final plans related to wound vac.  TOC will continue to follow during the course of hospitalization.     Expected Discharge Plan:  Barriers to Discharge: Barriers Resolved,Other (comment) (Facility has no bed until Monday)  Expected Discharge Plan and Services Expected Discharge Plan: [place]   Discharge Planning Services: CM Consult Post Acute Care Choice: [place] Living arrangements for the past 2 months: Single Family Home                                       Social Determinants of Health (SDOH) Interventions    Readmission Risk Interventions No flowsheet data found.

## 2020-10-30 NOTE — Progress Notes (Signed)
Subjective: Patient agitated and not in a good mood this morning.  Mostly sleeping and really doesn't entertain my questions today.  Tolerating TFs at 20cc/hr currently.  ROS: See above, otherwise other systems negative  Objective: Vital signs in last 24 hours: Temp:  [98 F (36.7 C)-99 F (37.2 C)] 98 F (36.7 C) (04/22 0914) Pulse Rate:  [79-98] 88 (04/22 0914) Resp:  [17-24] 20 (04/22 0914) BP: (77-109)/(44-69) 98/56 (04/22 0914) SpO2:  [92 %-99 %] 95 % (04/22 0914) Weight:  [40.2 kg] 40.2 kg (04/22 0500) Last BM Date: 10/29/20  Intake/Output from previous day: 04/21 0701 - 04/22 0700 In: 190.7 [P.O.:100; NG/GT:90.7] Out: 150 [Urine:150] Intake/Output this shift: No intake/output data recorded.  PE: Abd: soft, cachetic, midline wound with VAC in place.  g-tube replaced.  Skin still with maceration as previously described.     Lab Results:  Recent Labs    10/29/20 0508 10/30/20 0510  WBC 10.7* 9.6  HGB 11.5* 10.5*  HCT 36.9 33.1*  PLT 223 213   BMET Recent Labs    10/29/20 0508 10/30/20 0510  NA 136 136  K 4.3 4.1  CL 102 105  CO2 26 23  GLUCOSE 77 101*  BUN 11 15  CREATININE 0.62 0.68  CALCIUM 8.6* 8.3*   PT/INR No results for input(s): LABPROT, INR in the last 72 hours. CMP     Component Value Date/Time   NA 136 10/30/2020 0510   NA 138 04/29/2020 0948   K 4.1 10/30/2020 0510   CL 105 10/30/2020 0510   CO2 23 10/30/2020 0510   GLUCOSE 101 (H) 10/30/2020 0510   BUN 15 10/30/2020 0510   BUN 11 04/29/2020 0948   CREATININE 0.68 10/30/2020 0510   CREATININE 0.74 11/30/2015 1327   CALCIUM 8.3 (L) 10/30/2020 0510   PROT 5.3 (L) 10/30/2020 0510   PROT 6.5 04/29/2020 0948   ALBUMIN 2.2 (L) 10/30/2020 0510   ALBUMIN 3.8 04/29/2020 0948   AST 39 10/30/2020 0510   ALT 27 10/30/2020 0510   ALKPHOS 83 10/30/2020 0510   BILITOT 0.5 10/30/2020 0510   BILITOT 0.7 04/29/2020 0948   GFRNONAA >60 10/30/2020 0510   GFRAA 69 04/29/2020 0948    Lipase     Component Value Date/Time   LIPASE 24 10/16/2020 1659       Studies/Results: IR Replc Gastro/Colonic Tube Percut W/Fluoro  Result Date: 10/30/2020 INDICATION: 87 year old woman status post gastrostomy tube placement in February, 2022 with subsequent removal presents today measure radiology for Ree insertion 4 supplemental nutrition requirements. EXAM: Gastrostomy tube Ree insertion through pre-existing access. MEDICATIONS: None ANESTHESIA/SEDATION: Versed 1.5 mg IV; Fentanyl 25 mcg IV Moderate Sedation Time:  10 minutes The patient was continuously monitored during the procedure by the interventional radiology nurse under my direct supervision. CONTRAST:  5 mL of Omnipaque 350-administered into the gastric lumen. FLUOROSCOPY TIME:  Fluoroscopy Time: 1 minutes 12 seconds (3 mGy). COMPLICATIONS: None immediate. PROCEDURE: Informed written consent was obtained from the patient after a thorough discussion of the procedural risks, benefits and alternatives. All questions were addressed. Maximal Sterile Barrier Technique was utilized including caps, mask, sterile gowns, sterile gloves, sterile drape, hand hygiene and skin antiseptic. A timeout was performed prior to the initiation of the procedure. Following local ligament striation, the pre-existing opening in the left anterior abdominal wall was accessed with a Kumpe the catheter. Intraluminal positioning of the Kumpe the catheter tip was confirmed by administering contrast under fluoroscopy. Tract dilation  was performed and 20 [place] gastrostomy tube was inserted. Contrast administered through the gastrostomy tube confirmed intraluminal positioning. The retention balloon was inflated with 6 mL of saline. IMPRESSION: Percutaneous gastrostomy tube re-insertion as above. Electronically Signed   By: Miachel Roux M.D.   On: 10/30/2020 08:08   CT MAXILLOFACIAL W CONTRAST  Result Date: 10/29/2020 CLINICAL DATA:  Maxillofacial pain. Additional  history provided: Thrush, mouth pain. Mouth pain also related to dental implants screws with irritation. Lip swelling. EXAM: CT MAXILLOFACIAL WITH CONTRAST TECHNIQUE: Multidetector CT imaging of the maxillofacial structures was performed with intravenous contrast. Multiplanar CT image reconstructions were also generated. CONTRAST:  1mL OMNIPAQUE IOHEXOL 300 MG/ML  SOLN COMPARISON:  CT head 10/24/2020. FINDINGS: Osseous: The patient is edentulous. There are several metallic dental prostheses. No acute bony abnormality or aggressive osseous lesion. Orbits: No acute or significant orbital finding. The globes are normal in size and contour. The extraocular muscles and optic nerve sheath complexes are symmetric and unremarkable. No intraorbital mass or abnormal intraorbital enhancement. Sinuses: Moderate volume frothy secretions and fluid level within the left sphenoid sinus. The paranasal sinuses are otherwise normally aerated. Soft tissues: There is fairly diffuse mucosal enhancement within the oral cavity. Findings likely reflect sequela of reported thrush/mucositis. No appreciable mass within the oral cavity, pharynx or larynx. Limited intracranial: Redemonstrated cerebral atrophy and chronic small vessel ischemic disease. No evidence of acute intracranial abnormality within the field of view. IMPRESSION: Fairly diffuse mucosal enhancement within the oral cavity. Findings likely reflect sequela of reported thrush/mucositis. The patient is edentulous, although there are several metallic dental prostheses. Left sphenoid sinusitis. Electronically Signed   By: Kellie Simmering DO   On: 10/29/2020 13:43    Anti-infectives: Anti-infectives (From admission, onward)   Start     Dose/Rate Route Frequency Ordered Stop   10/26/20 1300  fluconazole (DIFLUCAN) tablet 100 mg        100 mg Oral Daily 10/26/20 1145     10/26/20 1230  sulfamethoxazole-trimethoprim (BACTRIM DS) 800-160 MG per tablet 1 tablet        1 tablet Oral  Every 12 hours 10/26/20 1145 10/27/20 2219   10/24/20 1600  fluconazole (DIFLUCAN) IVPB 100 mg  Status:  Discontinued        100 mg 50 mL/hr over 60 Minutes Intravenous Every 24 hours 10/23/20 2009 10/26/20 1145   10/24/20 1000  sulfamethoxazole-trimethoprim (BACTRIM) 160 mg in dextrose 5 % 250 mL IVPB  Status:  Discontinued        160 mg 260 mL/hr over 60 Minutes Intravenous Every 12 hours 10/23/20 2025 10/26/20 1145   10/23/20 2100  sulfamethoxazole-trimethoprim (BACTRIM) 160 mg in dextrose 5 % 250 mL IVPB        160 mg 260 mL/hr over 60 Minutes Intravenous  Once 10/23/20 2025 10/23/20 2220   10/23/20 1715  cefTRIAXone (ROCEPHIN) 2 g in sodium chloride 0.9 % 100 mL IVPB        2 g 200 mL/hr over 30 Minutes Intravenous  Once 10/23/20 1704 10/23/20 1805   10/23/20 1715  metroNIDAZOLE (FLAGYL) IVPB 500 mg  Status:  Discontinued        500 mg 100 mL/hr over 60 Minutes Intravenous  Once 10/23/20 1704 10/23/20 1937   10/23/20 1545  fluconazole (DIFLUCAN) tablet 200 mg        200 mg Oral  Once 10/23/20 1541 10/23/20 1608       Assessment/Plan Confusion - resolved SPCM/FTT - calorie count per nutrition.  Also  ask for bolus TFs.  Does not need continuous TFs as her gut works. Stomatitis - per medicine  9 weeks postop from ex lap with detorsion of SB with placement of g-tube.  Skin excoriation from leaking g-tube site -no infection noted -midline wound with VAC in place. -gerhardt's butt cream ordered for a barrier.  She is supposed to be using Ilex barrier cream at home to help control this. -start bolus TFs today.  Calorie count -patient is surgically stable for DC to SNF when medically stable  FEN - D1 diet, TFs VTE - lovenox ID - diflucan (oral thrush) Follow up - Dr. Zenia Resides   LOS: 7 days    Henreitta Cea , [facility] 10/30/2020, 9:34 AM Please see Amion for pager number during day hours 7:00am-4:30pm or 7:00am -11:30am on weekends

## 2020-10-30 NOTE — Progress Notes (Signed)
Daily Progress Note   Patient Name: [name] [name]       Date: 10/30/2020 DOB: 03-19-1931  Age: 87 y.o. MRN#: [id number] Attending Physician: Kerney Elbe, DO Primary Care Physician: Hoyt Koch, MD Admit Date: 10/23/2020  Reason for Consultation/Follow-up: Establishing goals of care  Subjective: I saw and examined [name] [name] today.  She was lying in bed and reports she is having pain in her mouth but otherwise denies any complaints.  We reviewed her clinical course of this admission and concern that she continues to decline in her nutrition and functional status.  We talked about feeding tube, her multiple comorbidities, as well as plan for care moving forward.    At this point, she remains invested in care plan to continue any and all aggressive interventions.  She reports being hopeful to discharge from the hospital soon and ultimately like to work to get back to [facility].  Length of Stay: 7  Current Medications: Scheduled Meds:  . [START ON 10/31/2020] (feeding supplement) PROSource Plus  30 mL Oral Daily  . chlorhexidine  15 mL Mouth/Throat QID  . enoxaparin (LOVENOX) injection  30 mg Subcutaneous Q24H  . famotidine  20 mg Per Tube Daily  . feeding supplement  1 Container Oral TID BM  . fluconazole  100 mg Oral Daily  . Gerhardt's butt cream   Topical BID  . lipase/protease/amylase  36,000 Units Oral TID AC  . magic mouthwash w/lidocaine  5 mL Oral TID  . mirtazapine  15 mg Oral QHS  . multivitamin with minerals  1 tablet Oral Daily  . mycophenolate  1,000 mg Oral BID  . Nintedanib  100 mg Oral BID  . predniSONE  5 mg Oral Q breakfast    Continuous Infusions: . feeding supplement (OSMOLITE 1.2 CAL) 1,000 mL (10/29/20 2228)  . sodium chloride      PRN  Meds: acetaminophen **OR** acetaminophen, benzocaine, diphenhydrAMINE, fluticasone, HYDROmorphone (DILAUDID) injection, ondansetron **OR** ondansetron (ZOFRAN) IV  Physical Exam          Vital Signs: BP (!) 97/57 (BP Location: Right Arm)   Pulse 74   Temp 97.6 F (36.4 C) (Oral)   Resp 19   Ht 5' 4.5" (1.638 m)   Wt 40.2 kg   SpO2 100%   BMI 14.98 kg/m  SpO2:  SpO2: 100 % O2 Device: O2 Device: Room Air O2 Flow Rate: O2 Flow Rate (L/min): 4 L/min  Intake/output summary:   Intake/Output Summary (Last 24 hours) at 10/30/2020 1857 Last data filed at 10/30/2020 1506 Gross per 24 hour  Intake 662.67 ml  Output --  Net 662.67 ml   LBM: Last BM Date: 10/30/20 Baseline Weight: Weight: 40.3 kg Most recent weight: Weight: 40.2 kg       Palliative Assessment/Data:      Patient Active Problem List   Diagnosis Date Noted  . Abdominal wall cellulitis 10/24/2020  . Severe sepsis ([place]) 10/23/2020  . Abdominal pain 10/20/2020  . Diarrhea 10/02/2020  . Pressure injury of skin 09/06/2020  . Acute on chronic respiratory failure with hypoxemia ([place]) 08/23/2020  . Protein-calorie malnutrition, severe 08/20/2020  . Fall 01/25/2020  . Other fatigue 01/03/2020  . Left shoulder pain 01/03/2020  . Urinary frequency 01/03/2020  . Acute otalgia, left 09/06/2019  . Unintentional weight loss 07/18/2019  . Personal history of PE (pulmonary embolism) 04/23/2019  . Headache 02/11/2019  . Iron deficiency anemia 12/21/2018  . Cold sore 10/23/2018  . Blood in stool 09/14/2018  . 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 Therapeutic drug monitoring 07/17/2018  . Chronic diastolic CHF (congestive heart failure) ([place]) 12/19/2017  . Rash 08/11/2017  . Chronic respiratory failure with hypoxia ([place]) 03/30/2017  . Angular cheilitis 03/08/2017  . Leg pain 10/25/2016  . Back pain 08/05/2016  . RA (rheumatoid arthritis) ([place]) 03/31/2015  . Allergic rhinitis   . Varicose vein 09/11/2014  . ILD (interstitial  lung disease) ([place]) 06/25/2012  . Chest pain 02/27/2012  . CAD (coronary artery disease) 02/27/2012  . Pruritus 08/18/2009  . Postinflammatory pulmonary fibrosis / RA ILD  06/30/2008  . Constipation 01/03/2008  . Dyslipidemia 06/16/2007    Palliative Care Assessment & Plan   Patient Profile: 87 year old female with small bowel obstruction status post ex lap in February 2022 with g-tube placement with failure to thrive and sepsis who is now status post feeding tube replacement  Recommendations/Plan: -Full code/full scope -Discussed with [name] [name] today.  He is very cachectic and frail, however, she remains invested in plan for continuation of any and all aggressive interventions. -Recommend palliative care to follow-up with facility   Goals of Care and Additional Recommendations:  Limitations on Scope of Treatment: Full Scope Treatment  Code Status:    Code Status Orders  (From admission, onward)         Start     Ordered   10/23/20 1938  Full code  Continuous        10/23/20 1937        Code Status History    Date Active Date Inactive Code Status Order ID Comments User Context   08/17/2020 1413 09/08/2020 1521 Full Code [id number]  Jonnie Finner, DO ED   01/25/2020 0454 01/27/2020 1819 Full Code [id number]  Chauncey Mann, MD Inpatient   06/17/2013 0948 06/19/2013 1517 Full Code [id number]  Burtis Junes, NP Inpatient   12/31/2012 1902 01/02/2013 1526 Full Code [id number]  Elsie Stain, MD Inpatient   12/31/2012 1736 12/31/2012 1902 Full Code [id number]  Juanito Doom, MD Outpatient   Advance Care Planning Activity    Advance Directive Documentation   Flowsheet Row Most Recent Value  Type of Advance Directive Healthcare Power of Attorney, Living will  Pre-existing out of facility DNR order (yellow form or pink MOST form) --  "MOST" Form in Place? --  Prognosis:   Unable to determine  Discharge Planning:  [facility] for rehab with Palliative  care service follow-up  Care plan was discussed with patient  Thank you for allowing the Palliative Medicine Team to assist in the care of this patient.   Total Time 30 Prolonged Time Billed No      Greater than 50%  of this time was spent counseling and coordinating care related to the above assessment and plan.  Micheline Rough, MD  Please contact Palliative Medicine Team phone at [phone] for questions and concerns.

## 2020-10-30 NOTE — Progress Notes (Addendum)
Brief Nutrition Note  Received consult from surgery regarding changing pt's TF regimen to bolus from continuous.  Spoke with RN over the phone. Explained that given pt's high refeeding risk and the ability to start boluses would likely cause nursing to have to give the pt such small volumes frequently to determine if pt can tolerate it, would recommend continuous feeds.  Currently receiving 20 ml/hr of Osmolite 1.2 per flowsheets. Pt also ate 50% of lunch and 1 Boost Breeze with her lunch.  RD to put in orders for continuous TF regimen: Osmolite 1.2 @ 20 ml/hr to advance by 10 ml every 24 hours to reach goal rate of 50 ml/hr  45 ml Prosource TF once/day 100 ml free water QID.  At goal rate, this regimen will provide 1480 kcal, 78 grams protein, 1384 ml free water.   Recommend to monitor magnesium, potassium, and phosphorus daily for at least 3 days, MD to replete as needed, as pt is at risk for refeeding syndrome given severe malnutrition.  RD will continue to monitor per protocol.  Derrel Nip, RD, LDN Registered Dietitian After Hours/Weekend Pager # in Amion  Addendum: Spoke with surgery PA regarding plan. In agreement. Consider changing pt to bolus feeds closer to discharge next week if pt tolerates TF rate advancement over the next few days well enough.

## 2020-10-30 NOTE — Progress Notes (Signed)
Physical Therapy Treatment Patient Details Name: [name] [name] MRN: [id number] DOB: 06/24/1931 Today's Date: 10/30/2020    History of Present Illness Pt is 87 yo female admitted on 10/23/20 with hx of ex lap in Feb 2022, readmission Feb-March with complications, and continued to have issues with poor appetite, odynophagia, symptoms of fungal infection , and worsening weakness.  Pt now admitted with abdominal wall cellulitis (open wound).  Pt with PMH including UIP on 2L O2, chronic prednisone, RA, osteoporosis, HTN, history of DVT/PE, mild CAD, and SBO s/p ex lap February 2022 with G tube placement since removed    PT Comments    Patient agreeable to mobilize with therapy today and is pleasant, motivated, and alert. Continues to require min assist for bed mobility and Min-Mod assist for safety with transfers and use of RW. Pt with slight posterior lean in standing required min assist/tactile cue to shift weight anteriorly. Repeated verbal/tactile cues required for safety with stand step transfers bed>chair as pt has tendency to remove hand from walker to reach for alternate external support. Pt also has tendency to initiate sit prior to safe alignment with recliner. At [facility] pt's nephew present and time spent discussing discharge recommendations with pt/family. Discussed intense therapy requirements of CIR vs SNF setting and likely the need for 24/7 assist available when pt is ready to come home. Pt's nephew is adamant on pt receiving as much care as possible and strongly feels CIR is the right place for pt. Discussed concern about pt's current activity tolerance however pt and family hopeful this will improve. Requesting rehab admissions consult with SNF alternative if pt is not accepted for intense therapy. Acute PT will continue to progress pt's mobility as able during acute stay.   Follow Up Recommendations  CIR     Equipment Recommendations  Wheelchair cushion (measurements PT);Wheelchair  (measurements PT)    Recommendations for Other Services Rehab consult     Precautions / Restrictions Precautions Precautions: Fall Precaution Comments: NPWT abdomen Restrictions Weight Bearing Restrictions: No    Mobility  Bed Mobility Overal bed mobility: Needs Assistance Bed Mobility: Rolling;Sidelying to Sit Rolling: Supervision Sidelying to sit: Min assist       General bed mobility comments: pt initiated mobility to EOB but requires assist to raise trunk fully and scoot anteriorly.    Transfers Overall transfer level: Needs assistance Equipment used: Rolling walker (2 wheeled) Transfers: Sit to/from [facility] Sit to Stand: Min assist Stand pivot transfers: Mod assist       General transfer comment: pt unsteady with posterior lean on rising from EOB. min assist required to maintain balance with bil UE transition from EOB to RW. pt letting go of walker and reaching for arm rest of chair and bed rail when beginning stand step/pivot transfer to recliner. Cues for safety and mon assist to steady. Pt attempting to sit early in recliner before safely position in fron of chair.  Ambulation/Gait                 Stairs             Wheelchair Mobility    Modified Rankin (Stroke Patients Only)       Balance Overall balance assessment: Needs assistance Sitting-balance support: Feet supported Sitting balance-[name]: Fair     Standing balance support: Bilateral upper extremity supported Standing balance-[name]: Poor Standing balance comment: reliant on UE support and min to mod A for safety  Cognition Arousal/Alertness: Awake/alert Behavior During Therapy: Flat affect Overall Cognitive Status: Within Functional Limits for tasks assessed                                 General Comments: overall pt oriented to self and some of situation, pt aware of deficits and poor  activity tolerance.pt's nephew present at [place] and does not provide baseline information.      Exercises      General Comments        Pertinent Vitals/Pain Pain Assessment: Faces Faces Pain Scale: Hurts a little bit Pain Location: mouth Pain Descriptors / Indicators: Sore;Grimacing Pain Intervention(s): Limited activity within patient's tolerance;Monitored during session    Home Living                      Prior Function            PT Goals (current goals can now be found in the care plan section) Acute Rehab PT Goals Patient Stated Goal: agreeable to get to chair for dinner PT Goal Formulation: Patient unable to participate in goal setting Time For Goal Achievement: 11/07/20 Potential to Achieve Goals: Good Progress towards PT goals: Progressing toward goals    Frequency    Min 2X/week (will update pendign CIR acceptance)      PT Plan Other (comment) (Recs being updated)    Co-evaluation              AM-PAC PT "6 Clicks" Mobility   Outcome Measure  Help needed turning from your back to your side while in a flat bed without using bedrails?: A Little Help needed moving from lying on your back to sitting on the side of a flat bed without using bedrails?: A Little Help needed moving to and from a bed to a chair (including a wheelchair)?: A Lot Help needed standing up from a chair using your arms (e.g., wheelchair or bedside chair)?: A Lot Help needed to walk in hospital room?: A Lot Help needed climbing 3-5 steps with a railing? : Total 6 Click Score: 13    End of Session Equipment Utilized During Treatment: Gait belt Activity Tolerance: Patient tolerated treatment well Patient left: in chair;with call bell/phone within reach;with chair alarm set;with family/visitor present Nurse Communication: Mobility status PT Visit Diagnosis: Other abnormalities of gait and mobility (R26.89);Muscle weakness (generalized) (M62.81)     Time: 4742-5956 PT  Time Calculation (min) (ACUTE ONLY): 24 min  Charges:  $Therapeutic Activity: 8-22 mins $Self Care/Home Management: 8-22                     Verner Mould, DPT Acute Rehabilitation Services Office [phone] Pager [phone]     Jacques Navy 10/30/2020, 6:52 PM

## 2020-10-31 DIAGNOSIS — A419 Sepsis, unspecified organism: Secondary | ICD-10-CM | POA: Diagnosis not present

## 2020-10-31 DIAGNOSIS — I5032 Chronic diastolic (congestive) heart failure: Secondary | ICD-10-CM | POA: Diagnosis not present

## 2020-10-31 DIAGNOSIS — I2510 Atherosclerotic heart disease of native coronary artery without angina pectoris: Secondary | ICD-10-CM | POA: Diagnosis not present

## 2020-10-31 DIAGNOSIS — L03311 Cellulitis of abdominal wall: Secondary | ICD-10-CM | POA: Diagnosis not present

## 2020-10-31 LAB — CBC WITH DIFFERENTIAL/PLATELET
Abs Immature Granulocytes: 0.08 10*3/uL — ABNORMAL HIGH (ref 0.00–0.07)
Basophils Absolute: 0 10*3/uL (ref 0.0–0.1)
Basophils Relative: 0 %
Eosinophils Absolute: 0.1 10*3/uL (ref 0.0–0.5)
Eosinophils Relative: 1 %
HCT: 37 % (ref 36.0–46.0)
Hemoglobin: 11.6 g/dL — ABNORMAL LOW (ref 12.0–15.0)
Immature Granulocytes: 1 %
Lymphocytes Relative: 41 %
Lymphs Abs: 4.7 10*3/uL — ABNORMAL HIGH (ref 0.7–4.0)
MCH: 30.9 pg (ref 26.0–34.0)
MCHC: 31.4 g/dL (ref 30.0–36.0)
MCV: 98.7 fL (ref 80.0–100.0)
Monocytes Absolute: 1.2 10*3/uL — ABNORMAL HIGH (ref 0.1–1.0)
Monocytes Relative: 10 %
Neutro Abs: 5.3 10*3/uL (ref 1.7–7.7)
Neutrophils Relative %: 47 %
Platelets: 210 10*3/uL (ref 150–400)
RBC: 3.75 MIL/uL — ABNORMAL LOW (ref 3.87–5.11)
RDW: 20.7 % — ABNORMAL HIGH (ref 11.5–15.5)
WBC: 11.4 10*3/uL — ABNORMAL HIGH (ref 4.0–10.5)
nRBC: 0 % (ref 0.0–0.2)

## 2020-10-31 LAB — COMPREHENSIVE METABOLIC PANEL
ALT: 29 U/L (ref 0–44)
AST: 40 U/L (ref 15–41)
Albumin: 2.5 g/dL — ABNORMAL LOW (ref 3.5–5.0)
Alkaline Phosphatase: 92 U/L (ref 38–126)
Anion gap: 9 (ref 5–15)
BUN: 11 mg/dL (ref 8–23)
CO2: 27 mmol/L (ref 22–32)
Calcium: 8.9 mg/dL (ref 8.9–10.3)
Chloride: 101 mmol/L (ref 98–111)
Creatinine, Ser: 0.54 mg/dL (ref 0.44–1.00)
GFR, Estimated: >60 mL/min (ref >60)
Glucose, Bld: 101 mg/dL — ABNORMAL HIGH (ref 70–99)
Potassium: 3.8 mmol/L (ref 3.5–5.1)
Sodium: 137 mmol/L (ref 135–145)
Total Bilirubin: 0.4 mg/dL (ref 0.3–1.2)
Total Protein: 6.2 g/dL — ABNORMAL LOW (ref 6.5–8.1)

## 2020-10-31 LAB — MAGNESIUM
Magnesium: 1.8 mg/dL (ref 1.7–2.4)
Magnesium: 1.6 mg/dL — ABNORMAL LOW (ref 1.7–2.4)

## 2020-10-31 LAB — GLUCOSE, CAPILLARY
Glucose-Capillary: 106 mg/dL — ABNORMAL HIGH (ref 70–99)
Glucose-Capillary: 108 mg/dL — ABNORMAL HIGH (ref 70–99)
Glucose-Capillary: 118 mg/dL — ABNORMAL HIGH (ref 70–99)
Glucose-Capillary: 121 mg/dL — ABNORMAL HIGH (ref 70–99)
Glucose-Capillary: 126 mg/dL — ABNORMAL HIGH (ref 70–99)
Glucose-Capillary: 127 mg/dL — ABNORMAL HIGH (ref 70–99)
Glucose-Capillary: 146 mg/dL — ABNORMAL HIGH (ref 70–99)

## 2020-10-31 LAB — PHOSPHORUS
Phosphorus: 2.3 mg/dL — ABNORMAL LOW (ref 2.5–4.6)
Phosphorus: 2.4 mg/dL — ABNORMAL LOW (ref 2.5–4.6)

## 2020-10-31 MED ORDER — K PHOS MONO-SOD PHOS DI & MONO 155-852-130 MG PO TABS
500.0000 mg | ORAL_TABLET | Freq: Two times a day (BID) | ORAL | Status: AC
  Administered 2020-10-31: 500 mg
  Filled 2020-10-31 (×2): qty 2

## 2020-10-31 MED ORDER — MAGNESIUM SULFATE 2 GM/50ML IV SOLN
2.0000 g | Freq: Once | INTRAVENOUS | Status: AC
  Administered 2020-10-31: 2 g via INTRAVENOUS
  Filled 2020-10-31: qty 50

## 2020-10-31 NOTE — Progress Notes (Signed)
PROGRESS NOTE    [name] [name]  DJM:[id number] DOB: [birth year] DOA: 10/23/2020 PCP: Hoyt Koch, MD  Brief Narrative:  The patient is an 87 year old elderly female with a past medical history significant for but not limited to UIP on 2 L of supplemental oxygen via nasal cannula, chronic prednisone, rheumatoid arthritis, history of DVT and PE, mild CAD, history of SBO status post exploratory lap in February 2022 with G-tube placement w       ho has since had it removed who returned to the ED on 10/23/2020 with worsening abdominal pain and wound drainage.  She was admitted for a few weeks in February and March 2229 for complications after ex lap requiring TPN and G-tube placement which was then ultimately removed.  Patient discharged to SNF for less than 2 weeks and then returned home with 24-hour care with close surgery follow-up.  She was then referred to wound care center and has been having a wound RN from [facility] performing twice daily wound dressings.  She is continued struggle for weeks with poor appetite, and now odynophagia and symptoms of fungal infection last few days she has been worsening with generalized weakness.  A superficial culture was taken of the wound and showed polymicrobial infection with plans to treat with Bactrim though the family stated that she is unlikely to swallow pills as it would upset her stomach.  In the ED she is noted to be tachypneic and tachycardic and found to be septic and given a sepsis protocol 30 cc/kg.  General surgery evaluated and managed and admitted this patient.  Currently she is status post 5 days of antibiotics and wound VAC has been applied to her mid abdominal wound and a G-tube is planned for placement today if possible.  Her appetite is extremely poor.  Palliative care has been called for goals of care discussion however family is clear in her wishes and want everything done currently.  PEG tube was replaced yesterday and dentistry evaluated  her mouth pain.  General surgery consulted dietitians for bolus feedings however dietitians do not feel that the goal to tolerate it so they are recommending continuing continuous feeds at this time with orders placed. Dentistry evaluated her mouth pain and they feel that her lower lip is sinking in her to lower implants causing ulcerations on both sides of her inner lip and they are recommending starting chlorhexidine rinses 3-4 times a day with swish and spit and using salt water rinses and allowing the patient to have 4 x 4 gauze to be placed on the upper side of her lip.  Repeat is negative for COVID.  Patient appears stable and labs were stable and she she will go to SNF once bed is available and insurance authorization obtained however now PT OT recommending CIR so they have been consulted for further evaluation.  Assessment & Plan:   Principal Problem:   Abdominal wall cellulitis Active Problems:   CAD (coronary artery disease)   RA (rheumatoid arthritis) (HCC)   Chronic respiratory failure with hypoxia (HCC)   Chronic diastolic CHF (congestive heart failure) (HCC)   Iron deficiency anemia   Personal history of PE (pulmonary embolism)   Unintentional weight loss   ILD (interstitial lung disease) (HCC)   Protein-calorie malnutrition, severe   Severe sepsis (HCC)  Severe Sepsis, poA, improved -The patient was tachypneic, tachycardic with leukocytosis and increasing drainage from abdominal wound suggestive of cellulitis. Lactic acid 2.7 indicating end organ dysfunction. -Aim to complete antibiotics after  5 days. Based on culture data and hoping to minimize nephrotoxins (e.g. zosyn, vancomycin), will continue bactrim > changed to PO now that po intake improving. Bactrim is now complte -Continuing to  Monitor culture data. -She is Afebrile but WBC went up  Briefly and is now gone from 9.5 -> 11.9 -> 10.7 -> 9.6 -> 11.4  Thrush, mouth pain:  -Continue magic mouthwash, diflucan.  -C/w  Benzocaine Mouth/Throat Pain 4 Times Daily prN -Mouth pain also related to dental implant screws with irritation.  -Recommend dentistry follow up as outpatient however pain is severe so have asked Dr. Benson Norway to evaluate; Dr. Benson Norway evaluated and felt that her lower lip is sinking in her to lower implants causing ulcerations on both sides of her inner lip and they are recommending starting chlorhexidine rinses 3-4 times a day with swish and spit and using salt water rinses and allowing the patient to have 4 x 4 gauze to be placed on the upper side of her lip. -Given pain and because her lip started swelling yesterday will get CT Maxillofacial to evaluate further -CT maxillofacial showed "Fairly diffuse mucosal enhancement within the oral cavity. Findings likely reflect sequela of reported thrush/mucositis. The patient is edentulous, although there are several metallic dental prostheses. Left sphenoid sinusitis"  -Lip Swelling improved with low dose Benadryl -Appreciated dentistry evaluation  Nonhealing abdominal wounds, MASD:  -Unable to stop prednisone. Remains on 5 mg po Daily with Breakfast  -Optimize nutrition. Initiate tube feeds if and once G tube inserted. - Wound care per general surgery > initiate wound vac   - Continue with barrier cream but Surgery has increased this to BID  Dehydration Severe protein-calorie malnutrition, failure to thrive, generalized weakness:  -Appears emaciated. Ketonuria without glucosuria.  -Dietitian consulted. Prealbumin is 8.  Interventional radiology was consulted and she had a PEG tube placement through her old G site. -SLP evaluation > mild aspiration risk, unable to chew so dysphagia 1 diet, thin liquids recommended. No dysphagia noted.  -C/w Creon 36,000 units po TID before meals  -C/w with Mirtazepine 15 gmg po qHS for appetite stimulation. -Continued IVF and now stopped. Last EF preserved, indeterminate diastolic function, and no evidence of volume  overload.  -C/w Prosource Pluse 30 mL po TID and Boost Breeze 1 Contatiner po TID between meals; Per Dietitian "if IR able to place PEG, recommend Osmolite 1.2 @ 20 ml/hr to advance by 10 ml every 24 hours to reach goal rate of 50 ml/hr with 45 ml Prosource TF once/day and 100 ml free water QID."  Tube feedings have been resumed and general surgery wanted to place the patient on bolus feedings however dietary does not feel that the patient will be able to tolerate this -PT, OT, TOC consulted -Palliative Consulted for [place] Discussion and will continue current plan of care and will continue aggressive measures  Acute Delirium -Improved. -C/w Delirium precautions, prefer to avoid sedating agents as much as possible.  Hyperkalemia, improved AKI, improved -Improved.  -Lokelma given with resolution. -Initiated on IVF but this has now been discontinued -Patient's potassium this morning 3.8 -Patient's BUN/Creatinine is now stable at 11/0.54 -Will need continued monitoring while on bactrim. -Avoid nephrotoxic medications, contrast dyes, hypotension and renally dose medications -Repeat CMP in the AM   Hypokalemia, hypomagnesemia, hypophosphatemia:  -At risk of refeeding syndrome, continue checking regularly and supplementing as indicated. -Her K+ is now 3.8, mag level is 1.8 and Phos Level today was 2.3 -Electrolytes are being repleted -Continue monitor and  trend and replete as necessary -Repeat CMP in a.m.  Chronic Hypoxic Respiratory Failure due to UIP:  -No infiltrate on CXR. At baseline. Included portions of lungs on CT abd are stable without acute findings. - Continue 2L O2. -SpO2: 98 % O2 Flow Rate (L/min): 4 L/min and weaned to home regimen -Continue prednisone -Restarted Mycophenolate 1000 mg po BID and Nintendanib on 10/27/20 given low suspicion for severe infection. -Will continue to Monitor   Goals of Care -These were introduced at admission; Given the combination of  malnutrition, declining functional status, advanced age, and chronic immunosuppression suggest these wounds may never heal. The patient and family are very clear in their wishes at this time, however. They would like any and all available aggressive interventions with curative intent. This includes prolonged TPN, intensive wound care and physical therapy, IV antibiotics, and hospital monitoring.  -Full code confirmed. -Palliative care following as inpatient and Dr. Domingo Cocking to weigh in. They had an appointment with [facility] as outpatient pending.  Right Foot Drop:  -CT head without acute intracranial abnormality. Suspect stretch peroneal nerve palsy.  -PT/OT recommending SNF but now recommending CIR   Gallbladder distention, CBD Dilatation:  -Stable appearance on CT abd/pelvis 4/16 without inflammatory findings. LFTs wnl and no focal pain overlying this area. No further evaluation currently planned. -Follow up as an outpatient   Elevated AST -Mild and likely reactive -Patient's AST is now 26 yesterday and improved to 40 and normalized  -Continue monitor and trend and if necessary will obtain a right upper quadrant ultrasound -Repeat CMP in the AM   COVID-19 Exposure -Per Dr. Chauncey Reading reported tested postive SARS-CoV-2 on 4/19 and last contact with the patient was 4/18 -Patient is Fully Vaccinated and Screend Negative on 4/15/ -ID recommendations per Dr. Baxter Flattery:   "Pt should wear mask ideally all the time, though with her chronic hypoxic respiratory failure, would recommend at least while caregivers are in the room. Door should remain closed.  - No aerosolizing medications are ordered and no aerosolizing procedures are scheduled.  - No PPE/precautions need to be implemented for staff unless patient becomes symptomatic.  - Of course, no further visitation allowable for HCPOA. - Retest the patient for covid 3 days after last exposure: This would be on 4/21." -Retesting of her COVID 19 was  negative  DVT prophylaxis: Enoxaparin 30 mg sq q24h Code Status: FULL CODE  Family Communication: No family present at bedside but I called and spoke with [name] at 4:15 pm Disposition Plan: Pending further clinical improvement and anticipate discharging to skilled nursing facility vs. CIR in the next 24 to 48 hours  Status is: Inpatient  Remains inpatient appropriate because:Unsafe d/c plan, IV treatments appropriate due to intensity of illness or inability to take PO and Inpatient level of care appropriate due to severity of illness   Dispo: The patient is from: Home              Anticipated d/c is to: SNF              Patient currently is not medically stable to d/c.   Difficult to place patient No  Consultants:   General Surgery  Palliative Care  Interventional Radiology   Procedures:   Procedure: 20 fr G tube placed through existing access.  Antimicrobials:  Anti-infectives (From admission, onward)   Start     Dose/Rate Route Frequency Ordered Stop   10/26/20 1300  fluconazole (DIFLUCAN) tablet 100 mg  100 mg Oral Daily 10/26/20 1145     10/26/20 1230  sulfamethoxazole-trimethoprim (BACTRIM DS) 800-160 MG per tablet 1 tablet        1 tablet Oral Every 12 hours 10/26/20 1145 10/27/20 2219   10/24/20 1600  fluconazole (DIFLUCAN) IVPB 100 mg  Status:  Discontinued        100 mg 50 mL/hr over 60 Minutes Intravenous Every 24 hours 10/23/20 2009 10/26/20 1145   10/24/20 1000  sulfamethoxazole-trimethoprim (BACTRIM) 160 mg in dextrose 5 % 250 mL IVPB  Status:  Discontinued        160 mg 260 mL/hr over 60 Minutes Intravenous Every 12 hours 10/23/20 2025 10/26/20 1145   10/23/20 2100  sulfamethoxazole-trimethoprim (BACTRIM) 160 mg in dextrose 5 % 250 mL IVPB        160 mg 260 mL/hr over 60 Minutes Intravenous  Once 10/23/20 2025 10/23/20 2220   10/23/20 1715  cefTRIAXone (ROCEPHIN) 2 g in sodium chloride 0.9 % 100 mL IVPB        2 g 200 mL/hr over 30 Minutes  Intravenous  Once 10/23/20 1704 10/23/20 1805   10/23/20 1715  metroNIDAZOLE (FLAGYL) IVPB 500 mg  Status:  Discontinued        500 mg 100 mL/hr over 60 Minutes Intravenous  Once 10/23/20 1704 10/23/20 1937   10/23/20 1545  fluconazole (DIFLUCAN) tablet 200 mg        200 mg Oral  Once 10/23/20 1541 10/23/20 1608        Subjective: Seen and examined at bedside and was sitting in a chair at bedside and still complain about some mouth pain but denies any chest pain or shortness of breath.  No lightheadedness or dizziness.  No other concerns or complaints this time and family hopeful that she will be a candidate for inpatient rehab.  No other concerns or complaints at this time.  Objective: Vitals:   10/30/20 1505 10/30/20 1952 10/31/20 0402 10/31/20 1313  BP: (!) 97/57 92/72 108/71 100/69  Pulse: 74 80 78 83  Resp: 19 18 18 15   Temp: 97.6 F (36.4 C) 97.8 F (36.6 C) 97.9 F (36.6 C) 98.3 F (36.8 C)  TempSrc: Oral Oral Oral   SpO2: 100% 98% 97% 98%  Weight:   38.4 kg   Height:        Intake/Output Summary (Last 24 hours) at 10/31/2020 1609 Last data filed at 10/31/2020 1500 Gross per 24 hour  Intake 234.47 ml  Output 400 ml  Net -165.53 ml   Filed Weights   10/29/20 0517 10/30/20 0500 10/31/20 0402  Weight: 40.1 kg 40.2 kg 38.4 kg   Examination: Physical Exam:  Constitutional: Extremely thin and cachectic elderly female sitting in chair in no acute distress but still complain about mouth and jaw pain Eyes: Lids and conjunctivae normal, sclerae anicteric  ENMT: External Ears, Nose appear normal. Grossly normal hearing.  Neck: Appears normal, supple, no cervical masses, normal ROM, no appreciable thyromegaly: No JVD Respiratory: Diminished to auscultation bilaterally, no wheezing, rales, rhonchi or crackles. Normal respiratory effort and patient is not tachypenic. No accessory muscle use.  Has unlabored breathing but wearing supplemental oxygen his Cardiovascular: RRR, no  murmurs / rubs / gallops. S1 and S2 auscultated. No extremity edema. Abdomen: Soft, non-tender, non-distended.  PEG is in place and she has a midline abdominal incision with a wound VAC connection.  Bowel sounds positive.  GU: Deferred. Musculoskeletal: No clubbing / cyanosis of digits/nails. No joint deformity upper  and lower extremities.  Skin: No rashes, lesions, ulcers on limited skin evaluation. No induration; Warm and dry.  Neurologic: CN 2-12 grossly intact with no focal deficits. Romberg sign and cerebellar reflexes not assessed.  Psychiatric: Normal judgment and insight. Alert and oriented x 3. Normal mood and appropriate affect.   Data Reviewed: I have personally reviewed following labs and imaging studies  CBC: Recent Labs  Lab 10/28/20 0852 10/29/20 0508 10/30/20 0510 10/31/20 0513  WBC 11.9* 10.7* 9.6 11.4*  NEUTROABS 3.5 4.0 4.1 5.3  HGB 11.8* 11.5* 10.5* 11.6*  HCT 37.0 36.9 33.1* 37.0  MCV 98.7 98.7 98.5 98.7  PLT 225 223 213 [id number]   Basic Metabolic Panel: Recent Labs  Lab 10/27/20 0535 10/28/20 0458 10/28/20 0852 10/29/20 0508 10/30/20 0510 10/30/20 1450 10/30/20 1656 10/31/20 0513  NA 139 138  --  136 136  --   --  137  K 3.9 3.9  --  4.3 4.1  --   --  3.8  CL 104 101  --  102 105  --   --  101  CO2 29 28  --  26 23  --   --  27  GLUCOSE 82 83  --  77 101*  --   --  101*  BUN 10 15  --  11 15  --   --  11  CREATININE 0.66 0.63  --  0.62 0.68  --   --  0.54  CALCIUM 8.5* 8.5*  --  8.6* 8.3*  --   --  8.9  MG 1.9 1.8   < > 1.9 1.7 2.1 2.1 1.8  PHOS 2.5 2.3*   < > 2.6 2.4* 2.4* 2.5 2.3*   < > = values in this interval not displayed.   GFR: Estimated Creatinine Clearance: 29.5 mL/min (by C-G formula based on SCr of 0.54 mg/dL). Liver Function Tests: Recent Labs  Lab 10/29/20 0508 10/30/20 0510 10/31/20 0513  AST 57* 39 40  ALT 32 27 29  ALKPHOS 94 83 92  BILITOT 0.5 0.5 0.4  PROT 5.7* 5.3* 6.2*  ALBUMIN 2.2* 2.2* 2.5*   No results for  input(s): LIPASE, AMYLASE in the last 168 hours. No results for input(s): AMMONIA in the last 168 hours. Coagulation Profile: No results for input(s): INR, PROTIME in the last 168 hours. Cardiac Enzymes: No results for input(s): CKTOTAL, CKMB, CKMBINDEX, TROPONINI in the last 168 hours. BNP (last 3 results) Recent Labs    06/03/20 1001 09/29/20 1425  PROBNP 100 58.0   HbA1C: No results for input(s): HGBA1C in the last 72 hours. CBG: Recent Labs  Lab 10/30/20 1947 10/31/20 0005 10/31/20 0358 10/31/20 0722 10/31/20 1141  GLUCAP 163* 118* 106* 121* 146*   Lipid Profile: No results for input(s): CHOL, HDL, LDLCALC, TRIG, CHOLHDL, LDLDIRECT in the last 72 hours. Thyroid Function Tests: No results for input(s): TSH, T4TOTAL, FREET4, T3FREE, THYROIDAB in the last 72 hours. Anemia Panel: No results for input(s): VITAMINB12, FOLATE, FERRITIN, TIBC, IRON, RETICCTPCT in the last 72 hours. Sepsis Labs: No results for input(s): PROCALCITON, LATICACIDVEN in the last 168 hours.  Recent Results (from the past 240 hour(s))  Blood Culture (routine x 2)     Status: None   Collection Time: 10/23/20  3:50 PM   Specimen: Site Not Specified; Blood  Result Value Ref Range Status   Specimen Description   Final    SITE NOT SPECIFIED Performed at [facility] [address] [place], [place]  [address]    Special Requests   Final    BOTTLES DRAWN AEROBIC AND ANAEROBIC Blood Culture results may not be optimal due to an inadequate volume of blood received in culture bottles Performed at [facility], [place] [address], [place], [place] [address]    Culture   Final    NO GROWTH 5 DAYS Performed at [facility], [place] [address], [place], [place] [address]    Report Status 10/28/2020 FINAL  Final  Blood Culture (routine x 2)     Status: None   Collection Time: 10/23/20  4:00 PM   Specimen: BLOOD  Result Value Ref Range Status   Specimen Description    Final    BLOOD SITE NOT SPECIFIED Performed at [facility] [address] [place], [place] [address]    Special Requests   Final    BOTTLES DRAWN AEROBIC AND ANAEROBIC Blood Culture results may not be optimal due to an inadequate volume of blood received in culture bottles Performed at [facility] [address], [place], [place] [address]    Culture   Final    NO GROWTH 5 DAYS Performed at [facility], [place] [address] [place], [place] [address]    Report Status 10/28/2020 FINAL  Final  Resp Panel by RT-PCR (Flu A&B, Covid) Nasopharyngeal Swab     Status: None   Collection Time: 10/23/20  5:34 PM   Specimen: Nasopharyngeal Swab; Nasopharyngeal(NP) swabs in vial transport medium  Result Value Ref Range Status   SARS Coronavirus 2 by RT PCR NEGATIVE NEGATIVE Final    Comment: (NOTE) SARS-CoV-2 target nucleic acids are NOT DETECTED.  The SARS-CoV-2 RNA is generally detectable in upper respiratory specimens during the acute phase of infection. The lowest concentration of SARS-CoV-2 viral copies this assay can detect is 138 copies/mL. A negative result does not preclude SARS-Cov-2 infection and should not be used as the sole basis for treatment or other patient management decisions. A negative result may occur with  improper specimen collection/handling, submission of specimen other than nasopharyngeal swab, presence of viral mutation(s) within the areas targeted by this assay, and inadequate number of viral copies(<138 copies/mL). A negative result must be combined with clinical observations, patient history, and epidemiological information. The expected result is Negative.  Fact Sheet for Patients:  EntrepreneurPulse.com.au  Fact Sheet for Healthcare Providers:  IncredibleEmployment.be  This test is no t yet approved or cleared by the [place] FDA and  has been authorized for detection  and/or diagnosis of SARS-CoV-2 by FDA under an Emergency Use Authorization (EUA). This EUA will remain  in effect (meaning this test can be used) for the duration of the COVID-19 declaration under Section 564(b)(1) of the Act, 21 U.S.C.section 360bbb-3(b)(1), unless the authorization is terminated  or revoked sooner.       Influenza A by PCR NEGATIVE NEGATIVE Final   Influenza B by PCR NEGATIVE NEGATIVE Final    Comment: (NOTE) The Xpert Xpress SARS-CoV-2/FLU/RSV plus assay is intended as an aid in the diagnosis of influenza from Nasopharyngeal swab specimens and should not be used as a sole basis for treatment. Nasal washings and aspirates are unacceptable for Xpert Xpress SARS-CoV-2/FLU/RSV testing.  Fact Sheet for Patients: EntrepreneurPulse.com.au  Fact Sheet for Healthcare Providers: IncredibleEmployment.be  This test is not yet approved or cleared by the [place] FDA and has been authorized for detection and/or diagnosis of SARS-CoV-2 by FDA under an Emergency Use Authorization (EUA).  This EUA will remain in effect (meaning this test can be used) for the duration of the COVID-19 declaration under Section 564(b)(1) of the Act, 21 U.S.C. section 360bbb-3(b)(1), unless the authorization is terminated or revoked.  Performed at [facility], [place] [address], [place], [place] [address]   SARS CORONAVIRUS 2 (TAT 6-24 HRS) Nasopharyngeal Nasopharyngeal Swab     Status: None   Collection Time: 10/30/20  1:53 AM   Specimen: Nasopharyngeal Swab  Result Value Ref Range Status   SARS Coronavirus 2 NEGATIVE NEGATIVE Final    Comment: (NOTE) SARS-CoV-2 target nucleic acids are NOT DETECTED.  The SARS-CoV-2 RNA is generally detectable in upper and lower respiratory specimens during the acute phase of infection. Negative results do not preclude SARS-CoV-2 infection, do not rule out co-infections with other pathogens, and  should not be used as the sole basis for treatment or other patient management decisions. Negative results must be combined with clinical observations, patient history, and epidemiological information. The expected result is Negative.  Fact Sheet for Patients: SugarRoll.be  Fact Sheet for Healthcare Providers: https://www.woods-mathews.com/  This test is not yet approved or cleared by the [place] FDA and  has been authorized for detection and/or diagnosis of SARS-CoV-2 by FDA under an Emergency Use Authorization (EUA). This EUA will remain  in effect (meaning this test can be used) for the duration of the COVID-19 declaration under Se ction 564(b)(1) of the Act, 21 U.S.C. section 360bbb-3(b)(1), unless the authorization is terminated or revoked sooner.  Performed at [facility], [place] [address] [place], [place] [address]    RN Pressure Injury Documentation: Pressure Injury 09/05/20 Sacrum Mid Stage 2 -  Partial thickness loss of dermis presenting as a shallow open injury with a red, pink wound bed without slough. (Active)  09/05/20 2150  Location: Sacrum  Location Orientation: Mid  Staging: Stage 2 -  Partial thickness loss of dermis presenting as a shallow open injury with a red, pink wound bed without slough.  Wound Description (Comments):   Present on Admission:      Pressure Injury 09/05/20 Sacrum Left Stage 2 -  Partial thickness loss of dermis presenting as a shallow open injury with a red, pink wound bed without slough. (Active)  09/05/20 2150  Location: Sacrum  Location Orientation: Left  Staging: Stage 2 -  Partial thickness loss of dermis presenting as a shallow open injury with a red, pink wound bed without slough.  Wound Description (Comments):   Present on Admission:    Estimated body mass index is 14.31 kg/m as calculated from the following:   Height as of this encounter: 5' 4.5" (1.638 m).   Weight as of  this encounter: 38.4 kg.  Malnutrition Type:  Nutrition Problem: Severe Malnutrition Etiology: chronic illness  Malnutrition Characteristics:  Signs/Symptoms: severe fat depletion,severe muscle depletion,percent weight loss Percent weight loss: 13.5 %  Nutrition Interventions:  Interventions: Tube feeding   Radiology Studies: IR Replc Gastro/Colonic Tube Percut W/Fluoro  Result Date: 10/30/2020 INDICATION: 87 year old woman status post gastrostomy tube placement in February, 2022 with subsequent removal presents today measure radiology for Ree insertion 4 supplemental nutrition requirements. EXAM: Gastrostomy tube Ree insertion through pre-existing access. MEDICATIONS: None ANESTHESIA/SEDATION: Versed 1.5 mg IV; Fentanyl 25 mcg IV Moderate Sedation Time:  10 minutes The patient was continuously monitored during the procedure by the interventional radiology nurse under my direct supervision. CONTRAST:  5 mL of Omnipaque 350-administered into the gastric lumen. FLUOROSCOPY TIME:  Fluoroscopy Time:  1 minutes 12 seconds (3 mGy). COMPLICATIONS: None immediate. PROCEDURE: Informed written consent was obtained from the patient after a thorough discussion of the procedural risks, benefits and alternatives. All questions were addressed. Maximal Sterile Barrier Technique was utilized including caps, mask, sterile gowns, sterile gloves, sterile drape, hand hygiene and skin antiseptic. A timeout was performed prior to the initiation of the procedure. Following local ligament striation, the pre-existing opening in the left anterior abdominal wall was accessed with a Kumpe the catheter. Intraluminal positioning of the Kumpe the catheter tip was confirmed by administering contrast under fluoroscopy. Tract dilation was performed and 20 French gastrostomy tube was inserted. Contrast administered through the gastrostomy tube confirmed intraluminal positioning. The retention balloon was inflated with 6 mL of saline.  IMPRESSION: Percutaneous gastrostomy tube re-insertion as above. Electronically Signed   By: Miachel Roux M.D.   On: 10/30/2020 08:08    Scheduled Meds: . (feeding supplement) PROSource Plus  30 mL Oral Daily  . chlorhexidine  15 mL Mouth/Throat QID  . enoxaparin (LOVENOX) injection  30 mg Subcutaneous Q24H  . famotidine  20 mg Per Tube Daily  . feeding supplement  1 Container Oral TID BM  . fluconazole  100 mg Oral Daily  . Gerhardt's butt cream   Topical BID  . lipase/protease/amylase  36,000 Units Oral TID AC  . magic mouthwash w/lidocaine  5 mL Oral TID  . mirtazapine  15 mg Oral QHS  . multivitamin with minerals  1 tablet Oral Daily  . mycophenolate  1,000 mg Oral BID  . Nintedanib  100 mg Oral BID  . phosphorus  500 mg Per Tube BID  . predniSONE  5 mg Oral Q breakfast   Continuous Infusions: . feeding supplement (OSMOLITE 1.2 CAL) 1,000 mL (10/29/20 2228)  . sodium chloride      LOS: 8 days   Kerney Elbe, DO [facility] PAGER is on [place]  If 7PM-7AM, please contact night-coverage [email or web address]

## 2020-10-31 NOTE — Progress Notes (Signed)
Inpatient Rehab Admissions:  Inpatient Rehab Consult received.  I spoke with patient's nephew, [name] on the phone for rehabilitation assessment and to discuss goals and expectations of an inpatient rehab admission.  He acknowledged understanding of goals and expectations.  He is interested in pt pursuing CIR.  Will continue to follow pt's tolerance of therapies.   Signed: Gayland Curry, [place], [place] Admissions Coordinator [phone]

## 2020-10-31 NOTE — Progress Notes (Signed)
Patient refused insulin and morning medications due to dreams and fears about taking medications. Patient requesting MD to give her medications. Attempted x3 to provide medications. Will continue to try to encourage patient to participate.

## 2020-11-01 DIAGNOSIS — I5032 Chronic diastolic (congestive) heart failure: Secondary | ICD-10-CM | POA: Diagnosis not present

## 2020-11-01 DIAGNOSIS — I2510 Atherosclerotic heart disease of native coronary artery without angina pectoris: Secondary | ICD-10-CM | POA: Diagnosis not present

## 2020-11-01 DIAGNOSIS — A419 Sepsis, unspecified organism: Secondary | ICD-10-CM | POA: Diagnosis not present

## 2020-11-01 DIAGNOSIS — L03311 Cellulitis of abdominal wall: Secondary | ICD-10-CM | POA: Diagnosis not present

## 2020-11-01 LAB — CBC WITH DIFFERENTIAL/PLATELET
Abs Immature Granulocytes: 0.1 10*3/uL — ABNORMAL HIGH (ref 0.00–0.07)
Abs Immature Granulocytes: 0.07 10*3/uL (ref 0.00–0.07)
Basophils Absolute: 0 10*3/uL (ref 0.0–0.1)
Basophils Absolute: 0 10*3/uL (ref 0.0–0.1)
Basophils Relative: 0 %
Basophils Relative: 0 %
Eosinophils Absolute: 0.1 10*3/uL (ref 0.0–0.5)
Eosinophils Absolute: 0.1 10*3/uL (ref 0.0–0.5)
Eosinophils Relative: 1 %
Eosinophils Relative: 1 %
HCT: 37.2 % (ref 36.0–46.0)
HCT: 35.1 % — ABNORMAL LOW (ref 36.0–46.0)
Hemoglobin: 12 g/dL (ref 12.0–15.0)
Hemoglobin: 11.1 g/dL — ABNORMAL LOW (ref 12.0–15.0)
Immature Granulocytes: 1 %
Immature Granulocytes: 1 %
Lymphocytes Relative: 42 %
Lymphocytes Relative: 39 %
Lymphs Abs: 4.9 10*3/uL — ABNORMAL HIGH (ref 0.7–4.0)
Lymphs Abs: 4.7 10*3/uL — ABNORMAL HIGH (ref 0.7–4.0)
MCH: 31 pg (ref 26.0–34.0)
MCH: 30.9 pg (ref 26.0–34.0)
MCHC: 32.3 g/dL (ref 30.0–36.0)
MCHC: 31.6 g/dL (ref 30.0–36.0)
MCV: 96.1 fL (ref 80.0–100.0)
MCV: 97.8 fL (ref 80.0–100.0)
Monocytes Absolute: 1.1 10*3/uL — ABNORMAL HIGH (ref 0.1–1.0)
Monocytes Absolute: 1.2 10*3/uL — ABNORMAL HIGH (ref 0.1–1.0)
Monocytes Relative: 9 %
Monocytes Relative: 9 %
Neutro Abs: 5.5 10*3/uL (ref 1.7–7.7)
Neutro Abs: 6.2 10*3/uL (ref 1.7–7.7)
Neutrophils Relative %: 47 %
Neutrophils Relative %: 50 %
Platelets: 96 10*3/uL — ABNORMAL LOW (ref 150–400)
Platelets: 230 10*3/uL (ref 150–400)
RBC: 3.87 MIL/uL (ref 3.87–5.11)
RBC: 3.59 MIL/uL — ABNORMAL LOW (ref 3.87–5.11)
RDW: 20.7 % — ABNORMAL HIGH (ref 11.5–15.5)
RDW: 20.4 % — ABNORMAL HIGH (ref 11.5–15.5)
WBC: 11.7 10*3/uL — ABNORMAL HIGH (ref 4.0–10.5)
WBC: 12.3 10*3/uL — ABNORMAL HIGH (ref 4.0–10.5)
nRBC: 0 % (ref 0.0–0.2)
nRBC: 0 % (ref 0.0–0.2)

## 2020-11-01 LAB — COMPREHENSIVE METABOLIC PANEL
ALT: 32 U/L (ref 0–44)
AST: 48 U/L — ABNORMAL HIGH (ref 15–41)
Albumin: 2.2 g/dL — ABNORMAL LOW (ref 3.5–5.0)
Alkaline Phosphatase: 85 U/L (ref 38–126)
Anion gap: 9 (ref 5–15)
BUN: 9 mg/dL (ref 8–23)
CO2: 27 mmol/L (ref 22–32)
Calcium: 9 mg/dL (ref 8.9–10.3)
Chloride: 99 mmol/L (ref 98–111)
Creatinine, Ser: 0.47 mg/dL (ref 0.44–1.00)
GFR, Estimated: >60 mL/min (ref >60)
Glucose, Bld: 87 mg/dL (ref 70–99)
Potassium: 4.4 mmol/L (ref 3.5–5.1)
Sodium: 135 mmol/L (ref 135–145)
Total Bilirubin: 0.4 mg/dL (ref 0.3–1.2)
Total Protein: 5.9 g/dL — ABNORMAL LOW (ref 6.5–8.1)

## 2020-11-01 LAB — MAGNESIUM: Magnesium: 1.6 mg/dL — ABNORMAL LOW (ref 1.7–2.4)

## 2020-11-01 LAB — GLUCOSE, CAPILLARY
Glucose-Capillary: 101 mg/dL — ABNORMAL HIGH (ref 70–99)
Glucose-Capillary: 105 mg/dL — ABNORMAL HIGH (ref 70–99)
Glucose-Capillary: 108 mg/dL — ABNORMAL HIGH (ref 70–99)
Glucose-Capillary: 120 mg/dL — ABNORMAL HIGH (ref 70–99)
Glucose-Capillary: 122 mg/dL — ABNORMAL HIGH (ref 70–99)
Glucose-Capillary: 92 mg/dL (ref 70–99)

## 2020-11-01 LAB — PHOSPHORUS: Phosphorus: 2.9 mg/dL (ref 2.5–4.6)

## 2020-11-01 MED ORDER — ENOXAPARIN SODIUM 30 MG/0.3ML ~~LOC~~ SOLN
30.0000 mg | Freq: Every day | SUBCUTANEOUS | Status: DC
  Administered 2020-11-03 – 2020-11-04 (×2): 30 mg via SUBCUTANEOUS
  Filled 2020-11-01 (×5): qty 0.3

## 2020-11-01 MED ORDER — MAGNESIUM SULFATE 2 GM/50ML IV SOLN
2.0000 g | Freq: Once | INTRAVENOUS | Status: AC
  Administered 2020-11-01: 2 g via INTRAVENOUS
  Filled 2020-11-01: qty 50

## 2020-11-01 NOTE — PMR Pre-admission (Signed)
PMR Admission Coordinator Pre-Admission Assessment  Patient: [name] [name] is an 87 y.o., female MRN: [id number] DOB: 08-Nov-1930 Height: 5' 4.5" (163.8 cm) Weight: 42.8 kg  Insurance Information HMO:     PPO:      PCP:      IPA:      80/20: yes     OTHER:  PRIMARY: Medicare A &B      Policy#: [id number]      Subscriber: patient CM Name:       Phone#:      Fax#:  Pre-Cert#:       Employer:  Benefits:  Phone #: verified eligibility via [employer] on 11/01/20     Name:  Eff. Date: Part A effective 09/08/97, Part B effective 07/12/99     Deduct: $1,556      Out of Pocket Max: NA      Life Max: NA CIR: 100% with Medicare approval       SNF: 100% days 1-20, 80% days 21-100 Outpatient: 80%     Co-Pay: 20% Home Health: 100%      Co-Pay:  DME: 80%     Co-Pay: 20% Providers: pt's choice SECONDARY: Tricare for Life      Policy#: [id number]     Phone#: [phone]  Financial Counselor:       Phone#:   The "Data Collection Information Summary" for patients in Inpatient Rehabilitation Facilities with attached "Privacy Act [place] Records" was provided and verbally reviewed with: Patient and Family  Emergency Contact Information Contact Information    Name Relation Home Work Mobile   [place] [phone]  [place], [place] Relative [phone]  [phone]   [name] [name] [id number]  [phone]      Current Medical History  Patient Admitting Diagnosis: Abdominal wall cellulitis; debility  History of Present Illness: Pt is an 87 year old female.  Medical hx significant for: RA, mild CAD, h/o DVT and PE, UIP on 2L O2 via nasal cannula, osteoporosis, HTN.  Pt had SBO and is s/p exploratory lap (February 2022). Pt readmitted in March d/t complications after ex lap requiring TPN and G-tube placement which was ultimately removed.  Pt discharged to a SNF for less than 2 weeks and then returned home with 24 hour care.  Wound RN from [facility] was  performing wound care.  Pt has had poor appetite and recently odynophagia, symptoms of fungal infection, and worsening generalized weakness.  Pt also presented to hospital April 8 d/t generalized abdominal pain. Pt presented to ED on 10/23/20 with worsening abdominal pain and wound drainage. Superficial culture was taken of wound and showed polymicrobial infection.  In the ED pt noted to be tachypneic and tachycardic and found to be septic. Pt treated with antibiotics and wound VAC applied to mid abdominal wound. PEG tubed placed 4/21. Dentistry evaluated pt's mouth pain. Found lower lip is sinking into her lower implants causing ulcerations on both sides of her inner lip; chlorhexidine rinses, salter water rinses, and 4x4 guaze placed on upper side of lip were recommended. Palliative care has been consulted, however family wants aggressive interventions. Therapy evaluations completed and CIR recommended d/t pt's deficits in functional mobility and ability to complete ADLs.     Patient's medical record from [facility] has been reviewed by the rehabilitation admission coordinator and physician.  Past Medical History  Past Medical History:  Diagnosis Date  . Allergic rhinitis   . Allergy   . CAD (coronary artery disease)  Mild CAD by cath 2008  . CHF (congestive heart failure) ([place])   . DJD (degenerative joint disease)    rheumatoid  . DVT (deep venous thrombosis) ([place])   . Dyslipidemia   . History of echocardiogram    Echo 12/2018: EF 60-65, mod asymmetric LVH, Gr 1 DD  . History of nuclear stress test    Myoview 5/17:  EF 74%, normal perfusion, low risk // Myoview 12/2018:  EF 89, very mild ischemia in inferoapical wall; Low Risk  . History of pulmonary embolism   . Hyperlipidemia   . Insomnia   . Osteoporosis   . Psoriasis   . Pulmonary fibrosis ([place])   . Rheumatoid arthritis ([place])     Family History   family history includes Alzheimer's disease in her sister and sister;  Cancer in her daughter; Heart attack (age of onset: 38) in her father; Heart disease in her sister; Kidney disease (age of onset: 86) in her daughter.  Prior Rehab/Hospitalizations Has the patient had prior rehab or hospitalizations prior to admission? Yes  Has the patient had major surgery during 100 days prior to admission? Yes   Current Medications  Current Facility-Administered Medications:  .  (feeding supplement) PROSource Plus liquid 30 mL, 30 mL, Oral, Daily, Sheikh, Omair Latif, DO, 30 mL at 11/03/20 1302 .  acetaminophen (TYLENOL) tablet 650 mg, 650 mg, Oral, Q6H PRN, 650 mg at 11/04/20 0838 **OR** acetaminophen (TYLENOL) suppository 650 mg, 650 mg, Rectal, Q6H PRN, Green, Terri L, RPH .  benzocaine (ORAJEL) 10 % mucosal gel, , Mouth/Throat, QID PRN, Donalynn Furlong, NP, Given at 11/04/20 [phone] .  chlorhexidine (PERIDEX) 0.12 % solution 15 mL, 15 mL, Mouth/Throat, QID, Raiford Noble [place], DO, 15 mL at 11/03/20 2146 .  diphenhydrAMINE (BENADRYL) injection 12.5 mg, 12.5 mg, Intravenous, Q6H PRN, Sheikh, Omair Latif, DO .  enoxaparin (LOVENOX) injection 30 mg, 30 mg, Subcutaneous, Daily, Sheikh, Omair Latif, DO, 30 mg at 11/03/20 1302 .  famotidine (PEPCID) 40 MG/5ML suspension 20 mg, 20 mg, Per Tube, Daily, Adrian Saran, RPH, 20 mg at 11/03/20 1305 .  feeding supplement (BOOST / RESOURCE BREEZE) liquid 1 Container, 1 Container, Oral, TID BM, Patrecia Pour, MD, 1 Container at 11/03/20 1347 .  feeding supplement (OSMOLITE 1.2 CAL) liquid 1,000 mL, 1,000 mL, Per Tube, Continuous, Sheikh, Omair Latif, DO, Last Rate: 50 mL/hr at 11/04/20 0100, 1,000 mL at 11/04/20 0100 .  fluconazole (DIFLUCAN) tablet 100 mg, 100 mg, Oral, Daily, Vance Gather B, MD, 100 mg at 11/03/20 1258 .  fluticasone (FLONASE) 50 MCG/ACT nasal spray 2 spray, 2 spray, Each Nare, Daily PRN, Vance Gather B, MD .  HYDROmorphone (DILAUDID) injection 0.5-1 mg, 0.5-1 mg, Intravenous, Q4H PRN, Patrecia Pour, MD, 1 mg at 11/03/20  1715 .  insulin aspart (novoLOG) injection 0-6 Units, 0-6 Units, Subcutaneous, Q4H, Raiford Noble [place], [place], 1 Units at 11/04/20 [id number] .  lipase/protease/amylase (CREON) capsule 36,000 Units, 36,000 Units, Oral, TID AC, Patrecia Pour, MD, 36,000 Units at 11/04/20 [phone] .  magic mouthwash w/lidocaine, 5 mL, Oral, TID, Patrecia Pour, MD, 5 mL at 11/04/20 0838 .  mirtazapine (REMERON SOL-TAB) disintegrating tablet 15 mg, 15 mg, Oral, QHS, Patrecia Pour, MD, 15 mg at 11/03/20 2146 .  multivitamin with minerals tablet 1 tablet, 1 tablet, Oral, Daily, Patrecia Pour, MD, 1 tablet at 11/03/20 1300 .  mycophenolate (CELLCEPT) capsule 1,000 mg, 1,000 mg, Oral, BID, Patrecia Pour, MD, 1,000 mg at 11/03/20 2146 .  Nintedanib (Ofev) CAPS 100 mg, 100 mg, Oral, BID, Minda Ditto, RPH, 100 mg at 11/03/20 2313 .  ondansetron (ZOFRAN) tablet 4 mg, 4 mg, Oral, Q6H PRN **OR** ondansetron (ZOFRAN) injection 4 mg, 4 mg, Intravenous, Q6H PRN, Vance Gather B, MD .  predniSONE (DELTASONE) tablet 5 mg, 5 mg, Oral, Q breakfast, Patrecia Pour, MD, 5 mg at 11/04/20 [id number]  Patients Current Diet:  Diet Order            DIET - DYS 1 Room service appropriate? Yes; Fluid consistency: Thin  Diet effective now                 Precautions / Restrictions Precautions Precautions: Fall Precaution Comments: NPWT abdomen Restrictions Weight Bearing Restrictions: No   Has the patient had 2 or more falls or a fall with injury in the past year? Yes  Prior Activity Level Household: wasn't leaving home because of wound per nephew's report. Pt is a Fish farm manager  Prior Functional Level Self Care: Did the patient need help bathing, dressing, using the toilet or eating? Independent  Indoor Mobility: Did the patient need assistance with walking from room to room (with or without device)? Independent  Stairs: Did the patient need assistance with internal or external stairs (with or without device)? Independent  Functional  Cognition: Did the patient need help planning regular tasks such as shopping or remembering to take medications? Needed some help  Home Assistive Devices / Equipment Home Assistive Devices/Equipment: Dentures (specify type),Cane (specify quad or straight),Other (Comment),Walker (specify type) (upper and lower dentures, single point cane, walk in shower, handicap height shower) Home Equipment: Cane - single point,Walker - 2 wheels  Prior Device Use: Indicate devices/aids used by the patient prior to current illness, exacerbation or injury? Manual wheelchair and Walker  Current Functional Level Cognition  Overall Cognitive Status: Within Functional Limits for tasks assessed Orientation Level: Oriented to situation,Oriented to person General Comments: Pt with decreased attention to task and education today due to pain. Pt closing eyes at times and turning away.    Extremity Assessment (includes Sensation/Coordination)  Upper Extremity Assessment: Generalized weakness (arthritic changes in hands) RUE Deficits / Details: ROM WFL; MMT 4-/5 throughout RUE Sensation: WNL RUE Coordination: WNL LUE Deficits / Details: ROM WFL; MMT 4-/5 throughout LUE Sensation: WNL LUE Coordination: WNL  Lower Extremity Assessment: Defer to PT evaluation RLE Deficits / Details: ROM: WFL; MMT: 4/5 hip and knee, 0/5 ankle dorsiflexion, 3/5 plantarflexion; reports new foot drop RLE Sensation: WNL RLE Coordination: WNL LLE Deficits / Details: ROM WFL; MMT 4/5 throughout LLE Sensation: WNL LLE Coordination: WNL    ADLs  Overall ADL's : Needs assistance/impaired Eating/Feeding: Set up,Bed level Grooming: Wash/dry face,Bed level,Minimal assistance Grooming Details (indicate cue type and reason): Pt stated that she could not tolerate sitting EOB for grooming today due to abdominal pain. Upper Body Bathing: Sitting,Maximal assistance Lower Body Bathing: Total assistance,Bed level (Applied lotion to dry appeared  lower legs. Pt stated that the lotion was cold despite trying to warm up in hands and requested no further ADL activities.) Upper Body Dressing : Moderate assistance,Sitting Lower Body Dressing: Total assistance,Sit to/from stand Toilet Transfer: Moderate assistance,Stand-pivot,RW,BSC Toilet Transfer Details (indicate cue type and reason): unsteady, poor safety awareness Toileting- Clothing Manipulation and Hygiene: Supervision/safety,Set up,Sitting/lateral lean Toileting - Clothing Manipulation Details (indicate cue type and reason): for peri care after voiding, patient able to complete seated on commode Functional mobility during ADLs: Moderate assistance,Rolling walker,Cueing for safety,Cueing for sequencing  Mobility  Overal bed mobility: Needs Assistance Bed Mobility: Rolling,Sidelying to Sit Rolling: Min guard Sidelying to sit: Min assist Supine to sit: Min assist,HOB elevated Sit to supine: Min assist General bed mobility comments: Cues needed to initaite rolling to Rt side, min assist to raise trunk upright and bring LE's off EOB fully.    Transfers  Overall transfer level: Needs assistance Equipment used: Rolling walker (2 wheeled) Transfers: Sit to/from [employer] Sit to Stand: Min assist Stand pivot transfers: Mod assist General transfer comment: Cues needed for safety with rising from EOB. min assist to steady with rise. Pt's Rt LE resting in abducted position and pt unable to bring it within BOS/walker frame. Mod assist for safety with walker management and to guide direction of turn to sit in recliner. Pt fatgieud and greatly limited by pain with steps to chair.    Ambulation / Gait / Stairs / Wheelchair Mobility  Ambulation/Gait Ambulation/Gait assistance: Mod assist Gait Distance (Feet): 3 Feet Assistive device: Rolling walker (2 wheeled) Gait Pattern/deviations: Step-to pattern,Decreased stride length,Decreased dorsiflexion - right,Shuffle,Trunk  flexed General Gait Details: pt required Mod assist to complete small side steps from EOB to recliner. pt unsteady and with poor safety awareness for RW management. Pt's Rt LE abducted and pt unable to control position to bring Rt foot underhip for improved posture/alignment. Gait velocity: decr    Posture / Balance Dynamic Sitting Balance Sitting balance - Comments: reliant on UE support Balance Overall balance assessment: Needs assistance Sitting-balance support: Feet supported Sitting balance-[name]: Fair Sitting balance - Comments: reliant on UE support Postural control: Posterior lean Standing balance support: During functional activity,Bilateral upper extremity supported Standing balance-[name]: Poor Standing balance comment: reliant on UE support and min to mod A for safety    Special needs/care consideration Wound Vac placed on abdominal wound, Skin Pressure injury: sacrum, mid stage 2; Surgical wound: abdomen/lower, medial; Surigical wound: abdomen/mid open wound (proximal to midline abdominal wound); Ecchymois: arm/bilateral; Catheter entry/exit: abdomen/medial, PEG tube, Bowel and Bladder incontinence and Designated visitor Jamey Ripa, nephew   Previous Home Environment (from acute therapy documentation) Living Arrangements: Alone Available Help at Discharge: Family,Available 24 hours/day,Other (Comment) (family has a team of caregivers able to provide 24/7 care) Type of Home: House Home Layout: One level Home Access: Level entry Entrance Stairs-Number of Steps: 2 Bathroom Shower/Tub: Multimedia programmer: Handicapped height Bathroom Accessibility: Yes How Accessible: Accessible via wheelchair,Accessible via [place]: Yes Type of [place]: [place] (if known): [place] Additional Comments: Information from prior visit - pt unable to provide today  Discharge Living Setting Plans for  Discharge Living Setting: Patient's home Type of Home at Discharge: House Discharge Home Layout: One level Discharge Home Access: Level entry Discharge Bathroom Shower/Tub: Walk-in shower Discharge Kay: Handicapped height Discharge Bathroom Accessibility: Yes How Accessible: Accessible via wheelchair,Accessible via walker Does the patient have any problems obtaining your medications?: No  Social/Family/Support Systems Anticipated Caregiver: Jamey Ripa, nephew- Family states they will either hire 24/7 or rotate to provide care at home at discharge. They have provided 24/7 assist to her before.  Anticipated Caregiver's Contact Information: [phone] Caregiver Availability: 24/7 Discharge Plan Discussed with Primary Caregiver: Yes Is Caregiver In Agreement with Plan?: Yes Does Caregiver/Family have Issues with Lodging/Transportation while Pt is in Rehab?: No  Goals Patient/Family Goal for Rehab: PT/OT/SLP min A Expected length of stay: 14-16 days Pt/Family Agrees to Admission and willing to participate:  Yes Program Orientation Provided & Reviewed with Pt/Caregiver Including Roles  & Responsibilities: Yes  Decrease burden of Care through IP rehab admission: NA  Possible need for SNF placement upon discharge: NA  Patient Condition: I have reviewed medical records from [facility], spoken with CSW, and family member. I discussed via phone for inpatient rehabilitation assessment.  Patient will benefit from ongoing PT, OT and SLP, can actively participate in 3 hours of therapy a day 5 days of the week, and can make measurable gains during the admission.  Patient will also benefit from the coordinated team approach during an Inpatient Acute Rehabilitation admission.  The patient will receive intensive therapy as well as Rehabilitation physician, nursing, social worker, and care management interventions.  Due to bladder management, bowel management, safety,  skin/wound care, disease management, medication administration, pain management and patient education the patient requires 24 hour a day rehabilitation nursing.  The patient is currently minA-total A with mobility and basic ADLs.  Discharge setting and therapy post discharge at home with home health is anticipated.  Patient has agreed to participate in the Acute Inpatient Rehabilitation Program and will admit today.  Preadmission Screen Completed By:  Genella Mech, 11/04/2020 10:44 AM ______________________________________________________________________   Discussed status with Dr. Posey Pronto  on 11/04/2020 at 62  and received approval for admission today.  Admission Coordinator:  Genella Mech, CCC-SLP,  With updates by Clemens Catholic CCC-SLP time [id number] /Date 11/04/2020   Assessment/Plan: Diagnosis: Debility 1. Does the need for close, 24 hr/day Medical supervision in concert with the patient's rehab needs make it unreasonable for this patient to be served in a less intensive setting? Yes  2. Co-Morbidities requiring supervision/potential complications: RA, mild CAD, h/o DVT and PE, UIP on 2L O2 via nasal cannula, osteoporosis, HTN (monitor and provide prns in accordance with increased physical exertion and pain), SBO  3. Due to bowel management, safety, skin/wound care, disease management, pain management and patient education, does the patient require 24 hr/day rehab nursing? Yes 4. Does the patient require coordinated care of a physician, rehab nurse, PT, OT, and SLP to address physical and functional deficits in the context of the above medical diagnosis(es)? Yes Addressing deficits in the following areas: balance, endurance, locomotion, strength, transferring, bathing, dressing, toileting, cognition, swallowing and psychosocial support 5. Can the patient actively participate in an intensive therapy program of at least 3 hrs of therapy 5 days a week? Yes 6. The potential for patient to make measurable  gains while on inpatient rehab is excellent 7. Anticipated functional outcomes upon discharge from inpatient rehab: supervision and min assist PT, supervision and min assist OT, modified independent and supervision SLP 8. Estimated rehab length of stay to reach the above functional goals is: 11-15 days. 9. Anticipated discharge destination: Home 10. Overall Rehab/Functional Prognosis: good   MD Signature: Delice Lesch, MD, ABPMR

## 2020-11-01 NOTE — Progress Notes (Signed)
PROGRESS NOTE    [name] [name]  DJM:[id number] DOB: [birth year] DOA: 10/23/2020 PCP: Hoyt Koch, MD  Brief Narrative:  The patient is an 87 year old elderly female with a past medical history significant for but not limited to UIP on 2 L of supplemental oxygen via nasal cannula, chronic prednisone, rheumatoid arthritis, history of DVT and PE, mild CAD, history of SBO status post exploratory lap in February 2022 with G-tube placement w       ho has since had it removed who returned to the ED on 10/23/2020 with worsening abdominal pain and wound drainage.  She was admitted for a few weeks in February and March 2229 for complications after ex lap requiring TPN and G-tube placement which was then ultimately removed.  Patient discharged to SNF for less than 2 weeks and then returned home with 24-hour care with close surgery follow-up.  She was then referred to wound care center and has been having a wound RN from [facility] performing twice daily wound dressings.  She is continued struggle for weeks with poor appetite, and now odynophagia and symptoms of fungal infection last few days she has been worsening with generalized weakness.  A superficial culture was taken of the wound and showed polymicrobial infection with plans to treat with Bactrim though the family stated that she is unlikely to swallow pills as it would upset her stomach.  In the ED she is noted to be tachypneic and tachycardic and found to be septic and given a sepsis protocol 30 cc/kg.  General surgery evaluated and managed and admitted this patient.  Currently she is status post 5 days of antibiotics and wound VAC has been applied to her mid abdominal wound and a G-tube is planned for placement today if possible.  Her appetite is extremely poor.  Palliative care has been called for goals of care discussion however family is clear in her wishes and want everything done currently.  PEG tube was replaced yesterday and dentistry evaluated  her mouth pain.  General surgery consulted dietitians for bolus feedings however dietitians do not feel that the goal to tolerate it so they are recommending continuing continuous feeds at this time with orders placed. Dentistry evaluated her mouth pain and they feel that her lower lip is sinking in her to lower implants causing ulcerations on both sides of her inner lip and they are recommending starting chlorhexidine rinses 3-4 times a day with swish and spit and using salt water rinses and allowing the patient to have 4 x 4 gauze to be placed on the upper side of her lip.  Repeat is negative for COVID.  Patient appears stable and labs were stable and she she will go to SNF once bed is available and insurance authorization obtained however now PT OT recommending CIR so they have been consulted for further evaluation.  Assessment & Plan:   Principal Problem:   Abdominal wall cellulitis Active Problems:   CAD (coronary artery disease)   RA (rheumatoid arthritis) (HCC)   Chronic respiratory failure with hypoxia (HCC)   Chronic diastolic CHF (congestive heart failure) (HCC)   Iron deficiency anemia   Personal history of PE (pulmonary embolism)   Unintentional weight loss   ILD (interstitial lung disease) (HCC)   Protein-calorie malnutrition, severe   Severe sepsis (HCC)  Severe Sepsis, poA, improved -The patient was tachypneic, tachycardic with leukocytosis and increasing drainage from abdominal wound suggestive of cellulitis. Lactic acid 2.7 indicating end organ dysfunction. -Aim to complete antibiotics after  5 days. Based on culture data and hoping to minimize nephrotoxins (e.g. zosyn, vancomycin), will continue bactrim > changed to PO now that po intake improving. Bactrim is now complte -Continuing to  Monitor culture data. -She is Afebrile with a TMax of 98.5 but WBC went up  Briefly and is now gone from 9.5 -> 11.9 -> 10.7 -> 9.6 -> 11.4 -> 12.3 and could be in the setting of pain versus  if this is in the setting of steroids -If continues to elevate further will obtain blood cultures x2, urinalysis and urine culture as well as a chest x-ray in the morning -Continue to monitor for signs and symptoms of infection and she did have a diarrheal episode but will need to continue to Monitor   Thrush, mouth pain:  -Continue magic mouthwash, diflucan.  -C/w Benzocaine Mouth/Throat Pain 4 Times Daily prN -Mouth pain also related to dental implant screws with irritation.  -Recommend dentistry follow up as outpatient however pain is severe so have asked Dr. Benson Norway to evaluate; Dr. Benson Norway evaluated and felt that her lower lip is sinking in her to lower implants causing ulcerations on both sides of her inner lip and they are recommending starting chlorhexidine rinses 3-4 times a day with swish and spit and using salt water rinses and allowing the patient to have 4 x 4 gauze to be placed on the upper side of her lip. -Given pain and because her lip started swelling yesterday will get CT Maxillofacial to evaluate further -CT maxillofacial showed "Fairly diffuse mucosal enhancement within the oral cavity. Findings likely reflect sequela of reported thrush/mucositis. The patient is edentulous, although there are several metallic dental prostheses. Left sphenoid sinusitis"  -Lip Swelling improved with low dose Benadryl -Appreciated dentistry evaluation  Nonhealing abdominal wounds, MASD:  -Unable to stop prednisone. Remains on 5 mg po Daily with Breakfast  -Optimize nutrition. Initiate tube feeds if and once G tube inserted. - Wound care per general surgery > initiate wound vac   - Continue with barrier cream but Surgery has increased this to BID  Dehydration Severe protein-calorie malnutrition, failure to thrive, generalized weakness:  -Appears emaciated. Ketonuria without glucosuria.  -Dietitian consulted. Prealbumin is 8.  Interventional radiology was consulted and she had a PEG tube  placement through her old G site. -SLP evaluation > mild aspiration risk, unable to chew so dysphagia 1 diet, thin liquids recommended. No dysphagia noted.  -C/w Creon 36,000 units po TID before meals  -C/w with Mirtazepine 15 gmg po qHS for appetite stimulation. -Continued IVF and now stopped. Last EF preserved, indeterminate diastolic function, and no evidence of volume overload.  -C/w Prosource Pluse 30 mL po TID and Boost Breeze 1 Contatiner po TID between meals; Per Dietitian "if IR able to place PEG, recommend Osmolite 1.2 @ 20 ml/hr to advance by 10 ml every 24 hours to reach goal rate of 50 ml/hr with 45 ml Prosource TF once/day and 100 ml free water QID."  Tube feedings have been resumed and general surgery wanted to place the patient on bolus feedings however dietary does not feel that the patient will be able to tolerate this -PT, OT, TOC consulted -Palliative Consulted for [place] Discussion and will continue current plan of care and will continue aggressive measures  Acute Delirium -Improved. -C/w Delirium precautions, prefer to avoid sedating agents as much as possible.  Hyperkalemia, improved AKI, improved -Improved.  -Lokelma given with resolution. -Initiated on IVF but this has now been discontinued -Patient's potassium this  morning 3.8 -Patient's BUN/Creatinine is now stable at 11/0.54 yesterday and today it is 9/0.47 -Will need continued monitoring while on bactrim. -Avoid nephrotoxic medications, contrast dyes, hypotension and renally dose medications -Repeat CMP in the AM   Hypokalemia, hypomagnesemia, hypophosphatemia:  -At risk of refeeding syndrome, continue checking regularly and supplementing as indicated. -Her K+ is now 3.8, mag level is 1.6 and Phos Level today was 2.3 -Electrolytes are being repleted and she is given IV mag sulfate 2 g today -Continue monitor and trend and replete as necessary -Repeat CMP in a.m.  Chronic Hypoxic Respiratory Failure due to  UIP:  -No infiltrate on CXR. At baseline. Included portions of lungs on CT abd are stable without acute findings. - Continue 2L O2. -SpO2: 100 % O2 Flow Rate (L/min): 4 L/min and weaned to home regimen -Continue prednisone -Restarted Mycophenolate 1000 mg po BID and Nintendanib on 10/27/20 given low suspicion for severe infection. -Will continue to Monitor   Goals of Care -These were introduced at admission; Given the combination of malnutrition, declining functional status, advanced age, and chronic immunosuppression suggest these wounds may never heal. The patient and family are very clear in their wishes at this time, however. They would like any and all available aggressive interventions with curative intent. This includes prolonged TPN, intensive wound care and physical therapy, IV antibiotics, and hospital monitoring.  -Full code confirmed. -Palliative care following as inpatient and Dr. Domingo Cocking to weigh in. They had an appointment with [facility] as outpatient pending.  Right Foot Drop:  -CT head without acute intracranial abnormality. Suspect stretch peroneal nerve palsy.  -PT/OT recommending SNF but now recommending CIR   Gallbladder distention, CBD Dilatation:  -Stable appearance on CT abd/pelvis 4/16 without inflammatory findings. LFTs wnl and no focal pain overlying this area. No further evaluation currently planned. -Follow up as an outpatient   Thrombocytopenia -Likely spurious result this morning as platelet count dropped to 96 from 210 yesterday; repeat CBC shows improvement and shows platelet count 230; will resume her VTE prophylaxis -Continue to monitor for signs and symptoms of bleeding; currently no overt bleeding noted  Normocytic Anemia -Relatively stable his hemoglobin/hematocrit is gone from 10.5/33.1 -> 11.6/37.0 -> 12.0/37.2 -> 11.1/35.1 -Check anemia panel in the a.m. -Continue to monitor for signs and symptoms of bleeding; currently no overt bleeding  noted For repeat CBC in  Elevated AST -Mild and likely reactive -Patient's AST is now 45 yesterday and improved to 40 and normalized but slightly trended back up to 48 -Continue monitor and trend and if necessary will obtain a right upper quadrant ultrasound -Repeat CMP in the AM   COVID-19 Exposure -Per Dr. Chauncey Reading reported tested postive SARS-CoV-2 on 4/19 and last contact with the patient was 4/18 -Patient is Fully Vaccinated and Screend Negative on 4/15/ -ID recommendations per Dr. Baxter Flattery:   "Pt should wear mask ideally all the time, though with her chronic hypoxic respiratory failure, would recommend at least while caregivers are in the room. Door should remain closed.  - No aerosolizing medications are ordered and no aerosolizing procedures are scheduled.  - No PPE/precautions need to be implemented for staff unless patient becomes symptomatic.  - Of course, no further visitation allowable for HCPOA. - Retest the patient for covid 3 days after last exposure: This would be on 4/21." -Retesting of her COVID 19 was negative  DVT prophylaxis: Enoxaparin 30 mg sq q24h Code Status: FULL CODE  Family Communication: No family present at bedside  Disposition Plan: Pending further  clinical improvement and anticipate discharging to skilled nursing facility vs. CIR in the next 24 to 48 hours  Status is: Inpatient  Remains inpatient appropriate because:Unsafe d/c plan, IV treatments appropriate due to intensity of illness or inability to take PO and Inpatient level of care appropriate due to severity of illness   Dispo: The patient is from: Home              Anticipated d/c is to: SNF              Patient currently is not medically stable to d/c.   Difficult to place patient No  Consultants:   General Surgery  Palliative Care  Interventional Radiology   Procedures:   Procedure: 20 fr G tube placed through existing access.  Antimicrobials:  Anti-infectives (From admission,  onward)   Start     Dose/Rate Route Frequency Ordered Stop   10/26/20 1300  fluconazole (DIFLUCAN) tablet 100 mg        100 mg Oral Daily 10/26/20 1145     10/26/20 1230  sulfamethoxazole-trimethoprim (BACTRIM DS) 800-160 MG per tablet 1 tablet        1 tablet Oral Every 12 hours 10/26/20 1145 10/27/20 2219   10/24/20 1600  fluconazole (DIFLUCAN) IVPB 100 mg  Status:  Discontinued        100 mg 50 mL/hr over 60 Minutes Intravenous Every 24 hours 10/23/20 2009 10/26/20 1145   10/24/20 1000  sulfamethoxazole-trimethoprim (BACTRIM) 160 mg in dextrose 5 % 250 mL IVPB  Status:  Discontinued        160 mg 260 mL/hr over 60 Minutes Intravenous Every 12 hours 10/23/20 2025 10/26/20 1145   10/23/20 2100  sulfamethoxazole-trimethoprim (BACTRIM) 160 mg in dextrose 5 % 250 mL IVPB        160 mg 260 mL/hr over 60 Minutes Intravenous  Once 10/23/20 2025 10/23/20 2220   10/23/20 1715  cefTRIAXone (ROCEPHIN) 2 g in sodium chloride 0.9 % 100 mL IVPB        2 g 200 mL/hr over 30 Minutes Intravenous  Once 10/23/20 1704 10/23/20 1805   10/23/20 1715  metroNIDAZOLE (FLAGYL) IVPB 500 mg  Status:  Discontinued        500 mg 100 mL/hr over 60 Minutes Intravenous  Once 10/23/20 1704 10/23/20 1937   10/23/20 1545  fluconazole (DIFLUCAN) tablet 200 mg        200 mg Oral  Once 10/23/20 1541 10/23/20 1608        Subjective: Seen and examined at bedside and was laying in bed in.  Alert and comfortable.  Continues to complain of some mouth pain and stated that she had some mild abdominal pain today.  No lightheadedness or dizziness.  No chest pain, shortness of breath.  Appeared uncomfortable.  No other concerns or complaints at this time.  Objective: Vitals:   10/31/20 1954 10/31/20 1954 11/01/20 0356 11/01/20 0400  BP:  (!) 96/59 99/70   Pulse:  86 88   Resp:  16 16   Temp: 98.5 F (36.9 C)  98.4 F (36.9 C)   TempSrc: Oral  Oral   SpO2:  95% 100%   Weight:    39.4 kg  Height:        Intake/Output  Summary (Last 24 hours) at 11/01/2020 1301 Last data filed at 11/01/2020 0700 Gross per 24 hour  Intake 1254.47 ml  Output 700 ml  Net 554.47 ml   Filed Weights   10/30/20 0500 10/31/20  0402 11/01/20 0400  Weight: 40.2 kg 38.4 kg 39.4 kg   Examination: Physical Exam:  Constitutional: Patient is extremely thin and cachectic elderly female laying in the bed sideways and does appear little uncomfortable complaining about mouth and abdominal pain Eyes: Lids and conjunctivae normal, sclerae anicteric  ENMT: External Ears, Nose appear normal. Grossly normal hearing.  Neck: Appears normal, supple, no cervical masses, normal ROM, no appreciable thyromegaly; no JVD Respiratory: Diminished to auscultation bilaterally with coarse breath sounds, no wheezing, rales, rhonchi or crackles. Normal respiratory effort and patient is not tachypenic. No accessory muscle use.  Wearing supplemental oxygen via nasal cannula but has unlabored breathing Cardiovascular: RRR, no murmurs / rubs / gallops. S1 and S2 auscultated.  No extremity edema Abdomen: Soft, mildly-tender, non-distended.  PEG is in place. And Has an abdominal Wound Vac in place. Bowel sounds positive.  GU: Deferred. Musculoskeletal: No clubbing / cyanosis of digits/nails. No joint deformity upper and lower extremities.  Skin: No rashes, lesions, ulcers on a limited skin evaluation. No induration; Warm and dry.  Neurologic: CN 2-12 grossly intact with no focal deficits. Romberg sign and cerebellar reflexes not assessed.  Psychiatric: Normal judgment and insight. Alert and oriented x 3. Anxious mood and appropriate affect.   Data Reviewed: I have personally reviewed following labs and imaging studies  CBC: Recent Labs  Lab 10/29/20 0508 10/30/20 0510 10/31/20 0513 11/01/20 0510 11/01/20 1107  WBC 10.7* 9.6 11.4* 11.7* 12.3*  NEUTROABS 4.0 4.1 5.3 5.5 6.2  HGB 11.5* 10.5* 11.6* 12.0 11.1*  HCT 36.9 33.1* 37.0 37.2 35.1*  MCV 98.7 98.5  98.7 96.1 97.8  PLT 223 213 210 96* [id number]   Basic Metabolic Panel: Recent Labs  Lab 10/28/20 0458 10/28/20 0852 10/29/20 0508 10/30/20 0510 10/30/20 1450 10/30/20 1656 10/31/20 0513 10/31/20 1713 11/01/20 0510  NA 138  --  136 136  --   --  137  --  135  K 3.9  --  4.3 4.1  --   --  3.8  --  4.4  CL 101  --  102 105  --   --  101  --  99  CO2 28  --  26 23  --   --  27  --  27  GLUCOSE 83  --  77 101*  --   --  101*  --  87  BUN 15  --  11 15  --   --  11  --  9  CREATININE 0.63  --  0.62 0.68  --   --  0.54  --  0.47  CALCIUM 8.5*  --  8.6* 8.3*  --   --  8.9  --  9.0  MG 1.8   < > 1.9 1.7 2.1 2.1 1.8 1.6* 1.6*  PHOS 2.3*   < > 2.6 2.4* 2.4* 2.5 2.3* 2.4* 2.9   < > = values in this interval not displayed.   GFR: Estimated Creatinine Clearance: 30.2 mL/min (by C-G formula based on SCr of 0.47 mg/dL). Liver Function Tests: Recent Labs  Lab 10/29/20 0508 10/30/20 0510 10/31/20 0513 11/01/20 0510  AST 57* 39 40 48*  ALT 32 27 29 32  ALKPHOS 94 83 92 85  BILITOT 0.5 0.5 0.4 0.4  PROT 5.7* 5.3* 6.2* 5.9*  ALBUMIN 2.2* 2.2* 2.5* 2.2*   No results for input(s): LIPASE, AMYLASE in the last 168 hours. No results for input(s): AMMONIA in the last 168 hours. Coagulation Profile: No results for  input(s): INR, PROTIME in the last 168 hours. Cardiac Enzymes: No results for input(s): CKTOTAL, CKMB, CKMBINDEX, TROPONINI in the last 168 hours. BNP (last 3 results) Recent Labs    06/03/20 1001 09/29/20 1425  PROBNP 100 58.0   HbA1C: No results for input(s): HGBA1C in the last 72 hours. CBG: Recent Labs  Lab 10/31/20 1950 10/31/20 2329 11/01/20 0353 11/01/20 0739 11/01/20 1115  GLUCAP 127* 108* 101* 108* 120*   Lipid Profile: No results for input(s): CHOL, HDL, LDLCALC, TRIG, CHOLHDL, LDLDIRECT in the last 72 hours. Thyroid Function Tests: No results for input(s): TSH, T4TOTAL, FREET4, T3FREE, THYROIDAB in the last 72 hours. Anemia Panel: No results for input(s):  VITAMINB12, FOLATE, FERRITIN, TIBC, IRON, RETICCTPCT in the last 72 hours. Sepsis Labs: No results for input(s): PROCALCITON, LATICACIDVEN in the last 168 hours.  Recent Results (from the past 240 hour(s))  Blood Culture (routine x 2)     Status: None   Collection Time: 10/23/20  3:50 PM   Specimen: Site Not Specified; Blood  Result Value Ref Range Status   Specimen Description   Final    SITE NOT SPECIFIED Performed at [facility] [address], [place], [place] [address]    Special Requests   Final    BOTTLES DRAWN AEROBIC AND ANAEROBIC Blood Culture results may not be optimal due to an inadequate volume of blood received in culture bottles Performed at [facility] [address], [place], [place] [address]    Culture   Final    NO GROWTH 5 DAYS Performed at [facility], [place] [address] [place], [place] [address]    Report Status 10/28/2020 FINAL  Final  Blood Culture (routine x 2)     Status: None   Collection Time: 10/23/20  4:00 PM   Specimen: BLOOD  Result Value Ref Range Status   Specimen Description   Final    BLOOD SITE NOT SPECIFIED Performed at [facility] [address], [place], [place] [address]    Special Requests   Final    BOTTLES DRAWN AEROBIC AND ANAEROBIC Blood Culture results may not be optimal due to an inadequate volume of blood received in culture bottles Performed at [facility] [address] [place], [place] [address]    Culture   Final    NO GROWTH 5 DAYS Performed at [facility], [place] [address], [place],  [address]    Report Status 10/28/2020 FINAL  Final  Resp Panel by RT-PCR (Flu A&B, Covid) Nasopharyngeal Swab     Status: None   Collection Time: 10/23/20  5:34 PM   Specimen: Nasopharyngeal Swab; Nasopharyngeal(NP) swabs in vial transport medium  Result Value Ref Range Status   SARS Coronavirus 2 by RT PCR NEGATIVE NEGATIVE Final     Comment: (NOTE) SARS-CoV-2 target nucleic acids are NOT DETECTED.  The SARS-CoV-2 RNA is generally detectable in upper respiratory specimens during the acute phase of infection. The lowest concentration of SARS-CoV-2 viral copies this assay can detect is 138 copies/mL. A negative result does not preclude SARS-Cov-2 infection and should not be used as the sole basis for treatment or other patient management decisions. A negative result may occur with  improper specimen collection/handling, submission of specimen other than nasopharyngeal swab, presence of viral mutation(s) within the areas targeted by this assay, and inadequate number of viral copies(<138 copies/mL). A negative result must be combined with clinical observations, patient history, and epidemiological  information. The expected result is Negative.  Fact Sheet for Patients:  EntrepreneurPulse.com.au  Fact Sheet for Healthcare Providers:  IncredibleEmployment.be  This test is no t yet approved or cleared by the [place] FDA and  has been authorized for detection and/or diagnosis of SARS-CoV-2 by FDA under an Emergency Use Authorization (EUA). This EUA will remain  in effect (meaning this test can be used) for the duration of the COVID-19 declaration under Section 564(b)(1) of the Act, 21 U.S.C.section 360bbb-3(b)(1), unless the authorization is terminated  or revoked sooner.       Influenza A by PCR NEGATIVE NEGATIVE Final   Influenza B by PCR NEGATIVE NEGATIVE Final    Comment: (NOTE) The Xpert Xpress SARS-CoV-2/FLU/RSV plus assay is intended as an aid in the diagnosis of influenza from Nasopharyngeal swab specimens and should not be used as a sole basis for treatment. Nasal washings and aspirates are unacceptable for Xpert Xpress SARS-CoV-2/FLU/RSV testing.  Fact Sheet for Patients: EntrepreneurPulse.com.au  Fact Sheet for Healthcare  Providers: IncredibleEmployment.be  This test is not yet approved or cleared by the [place] FDA and has been authorized for detection and/or diagnosis of SARS-CoV-2 by FDA under an Emergency Use Authorization (EUA). This EUA will remain in effect (meaning this test can be used) for the duration of the COVID-19 declaration under Section 564(b)(1) of the Act, 21 U.S.C. section 360bbb-3(b)(1), unless the authorization is terminated or revoked.  Performed at [facility], [place] [address], [place], [place] [address]   SARS CORONAVIRUS 2 (TAT 6-24 HRS) Nasopharyngeal Nasopharyngeal Swab     Status: None   Collection Time: 10/30/20  1:53 AM   Specimen: Nasopharyngeal Swab  Result Value Ref Range Status   SARS Coronavirus 2 NEGATIVE NEGATIVE Final    Comment: (NOTE) SARS-CoV-2 target nucleic acids are NOT DETECTED.  The SARS-CoV-2 RNA is generally detectable in upper and lower respiratory specimens during the acute phase of infection. Negative results do not preclude SARS-CoV-2 infection, do not rule out co-infections with other pathogens, and should not be used as the sole basis for treatment or other patient management decisions. Negative results must be combined with clinical observations, patient history, and epidemiological information. The expected result is Negative.  Fact Sheet for Patients: SugarRoll.be  Fact Sheet for Healthcare Providers: https://www.woods-mathews.com/  This test is not yet approved or cleared by the [place] FDA and  has been authorized for detection and/or diagnosis of SARS-CoV-2 by FDA under an Emergency Use Authorization (EUA). This EUA will remain  in effect (meaning this test can be used) for the duration of the COVID-19 declaration under Se ction 564(b)(1) of the Act, 21 U.S.C. section 360bbb-3(b)(1), unless the authorization is terminated or revoked  sooner.  Performed at [facility], [place] [address], [place], [place] [address]    RN Pressure Injury Documentation: Pressure Injury 09/05/20 Sacrum Mid Stage 2 -  Partial thickness loss of dermis presenting as a shallow open injury with a red, pink wound bed without slough. (Active)  09/05/20 2150  Location: Sacrum  Location Orientation: Mid  Staging: Stage 2 -  Partial thickness loss of dermis presenting as a shallow open injury with a red, pink wound bed without slough.  Wound Description (Comments):   Present on Admission:      Pressure Injury 09/05/20 Sacrum Left Stage 2 -  Partial thickness loss of dermis presenting as a shallow open injury with a red, pink wound bed without slough. (Active)  09/05/20 2150  Location: Sacrum  Location Orientation: Left  Staging: Stage 2 -  Partial thickness loss of dermis presenting as a shallow open injury with a red, pink wound bed without slough.  Wound Description (Comments):   Present on Admission:    Estimated body mass index is 14.68 kg/m as calculated from the following:   Height as of this encounter: 5' 4.5" (1.638 m).   Weight as of this encounter: 39.4 kg.  Malnutrition Type:  Nutrition Problem: Severe Malnutrition Etiology: chronic illness  Malnutrition Characteristics:  Signs/Symptoms: severe fat depletion,severe muscle depletion,percent weight loss Percent weight loss: 13.5 %  Nutrition Interventions:  Interventions: Tube feeding   Radiology Studies: No results found.  Scheduled Meds: . (feeding supplement) PROSource Plus  30 mL Oral Daily  . chlorhexidine  15 mL Mouth/Throat QID  . famotidine  20 mg Per Tube Daily  . feeding supplement  1 Container Oral TID BM  . fluconazole  100 mg Oral Daily  . Gerhardt's butt cream   Topical BID  . lipase/protease/amylase  36,000 Units Oral TID AC  . magic mouthwash w/lidocaine  5 mL Oral TID  . mirtazapine  15 mg Oral QHS  . multivitamin with minerals  1 tablet Oral  Daily  . mycophenolate  1,000 mg Oral BID  . Nintedanib  100 mg Oral BID  . predniSONE  5 mg Oral Q breakfast   Continuous Infusions: . feeding supplement (OSMOLITE 1.2 CAL) 1,000 mL (10/31/20 2123)  . sodium chloride      LOS: 9 days   Kerney Elbe, DO [facility] PAGER is on [place]  If 7PM-7AM, please contact night-coverage [email or web address]

## 2020-11-01 NOTE — Progress Notes (Signed)
Patient refused most morning and afternoon medications. Attempted x3. Education provided to patient on medications.

## 2020-11-02 DIAGNOSIS — L03311 Cellulitis of abdominal wall: Secondary | ICD-10-CM | POA: Diagnosis not present

## 2020-11-02 DIAGNOSIS — Z0120 Encounter for dental examination and cleaning without abnormal findings: Secondary | ICD-10-CM

## 2020-11-02 DIAGNOSIS — K0820 Unspecified atrophy of edentulous alveolar ridge: Secondary | ICD-10-CM

## 2020-11-02 DIAGNOSIS — I5032 Chronic diastolic (congestive) heart failure: Secondary | ICD-10-CM | POA: Diagnosis not present

## 2020-11-02 DIAGNOSIS — R220 Localized swelling, mass and lump, head: Secondary | ICD-10-CM

## 2020-11-02 DIAGNOSIS — A419 Sepsis, unspecified organism: Secondary | ICD-10-CM | POA: Diagnosis not present

## 2020-11-02 DIAGNOSIS — I2510 Atherosclerotic heart disease of native coronary artery without angina pectoris: Secondary | ICD-10-CM | POA: Diagnosis not present

## 2020-11-02 LAB — COMPREHENSIVE METABOLIC PANEL
ALT: 28 U/L (ref 0–44)
AST: 34 U/L (ref 15–41)
Albumin: 2.3 g/dL — ABNORMAL LOW (ref 3.5–5.0)
Alkaline Phosphatase: 82 U/L (ref 38–126)
Anion gap: 6 (ref 5–15)
BUN: 10 mg/dL (ref 8–23)
CO2: 29 mmol/L (ref 22–32)
Calcium: 8.7 mg/dL — ABNORMAL LOW (ref 8.9–10.3)
Chloride: 98 mmol/L (ref 98–111)
Creatinine, Ser: 0.51 mg/dL (ref 0.44–1.00)
GFR, Estimated: >60 mL/min (ref >60)
Glucose, Bld: 100 mg/dL — ABNORMAL HIGH (ref 70–99)
Potassium: 4.5 mmol/L (ref 3.5–5.1)
Sodium: 133 mmol/L — ABNORMAL LOW (ref 135–145)
Total Bilirubin: 0.4 mg/dL (ref 0.3–1.2)
Total Protein: 6 g/dL — ABNORMAL LOW (ref 6.5–8.1)

## 2020-11-02 LAB — CBC WITH DIFFERENTIAL/PLATELET
Abs Immature Granulocytes: 0.09 10*3/uL — ABNORMAL HIGH (ref 0.00–0.07)
Basophils Absolute: 0.1 10*3/uL (ref 0.0–0.1)
Basophils Relative: 0 %
Eosinophils Absolute: 0.1 10*3/uL (ref 0.0–0.5)
Eosinophils Relative: 1 %
HCT: 35.9 % — ABNORMAL LOW (ref 36.0–46.0)
Hemoglobin: 11.1 g/dL — ABNORMAL LOW (ref 12.0–15.0)
Immature Granulocytes: 1 %
Lymphocytes Relative: 41 %
Lymphs Abs: 4.5 10*3/uL — ABNORMAL HIGH (ref 0.7–4.0)
MCH: 31.1 pg (ref 26.0–34.0)
MCHC: 30.9 g/dL (ref 30.0–36.0)
MCV: 100.6 fL — ABNORMAL HIGH (ref 80.0–100.0)
Monocytes Absolute: 1.2 10*3/uL — ABNORMAL HIGH (ref 0.1–1.0)
Monocytes Relative: 11 %
Neutro Abs: 5.2 10*3/uL (ref 1.7–7.7)
Neutrophils Relative %: 46 %
Platelets: 229 10*3/uL (ref 150–400)
RBC: 3.57 MIL/uL — ABNORMAL LOW (ref 3.87–5.11)
RDW: 20.5 % — ABNORMAL HIGH (ref 11.5–15.5)
WBC: 11.2 10*3/uL — ABNORMAL HIGH (ref 4.0–10.5)
nRBC: 0 % (ref 0.0–0.2)

## 2020-11-02 LAB — LACTIC ACID, PLASMA: Lactic Acid, Venous: 1.1 mmol/L (ref 0.5–1.9)

## 2020-11-02 LAB — HEMOGLOBIN A1C
Hgb A1c MFr Bld: 5.5 % (ref 4.8–5.6)
Mean Plasma Glucose: 111.15 mg/dL

## 2020-11-02 LAB — GLUCOSE, CAPILLARY
Glucose-Capillary: 113 mg/dL — ABNORMAL HIGH (ref 70–99)
Glucose-Capillary: 112 mg/dL — ABNORMAL HIGH (ref 70–99)
Glucose-Capillary: 121 mg/dL — ABNORMAL HIGH (ref 70–99)
Glucose-Capillary: 298 mg/dL — ABNORMAL HIGH (ref 70–99)
Glucose-Capillary: 141 mg/dL — ABNORMAL HIGH (ref 70–99)
Glucose-Capillary: 110 mg/dL — ABNORMAL HIGH (ref 70–99)

## 2020-11-02 LAB — MAGNESIUM: Magnesium: 1.8 mg/dL (ref 1.7–2.4)

## 2020-11-02 LAB — PHOSPHORUS: Phosphorus: 3.3 mg/dL (ref 2.5–4.6)

## 2020-11-02 MED ORDER — INSULIN ASPART 100 UNIT/ML ~~LOC~~ SOLN
0.0000 [IU] | SUBCUTANEOUS | Status: DC
  Administered 2020-11-02: 3 [IU] via SUBCUTANEOUS
  Administered 2020-11-03 – 2020-11-04 (×2): 1 [IU] via SUBCUTANEOUS

## 2020-11-02 MED ORDER — SODIUM CHLORIDE 0.9 % IV BOLUS
500.0000 mL | Freq: Once | INTRAVENOUS | Status: AC
  Administered 2020-11-02: 500 mL via INTRAVENOUS

## 2020-11-02 MED ORDER — MAGNESIUM SULFATE 2 GM/50ML IV SOLN
2.0000 g | Freq: Once | INTRAVENOUS | Status: AC
  Administered 2020-11-02: 2 g via INTRAVENOUS
  Filled 2020-11-02: qty 50

## 2020-11-02 NOTE — Progress Notes (Signed)
Inpatient Rehab Admissions Coordinator:  There are no beds available in CIR today.  Will continue to follow.   Gayland Curry, [place], [place] Gu[place] Admissions Coordinator [phone]

## 2020-11-02 NOTE — Care Management Important Message (Signed)
Important Message  Patient Details IM Letter given to the Patient. Name: TIERNAN MILLIKIN MRN: [id number] Date of Birth: 02-22-1931   Medicare Important Message Given:  Yes     Kerin Salen 11/02/2020, 11:05 AM

## 2020-11-02 NOTE — TOC Progression Note (Signed)
Transition of Care [facility]) - Progression Note    Patient Details  Name: [name] [name] MRN: [id number] Date of Birth: 05-31-1931  Transition of Care [facility]) CM/SW [place], [place] Phone Number: 11/02/2020, 12:04 PM  Clinical Narrative:   According to Gayland Curry at [facility], patient has been found appropriate for that level of care, and will transfer when a bed becomes available.  No bed today.  Hospitalist alerted.  He agrees with plan and states we will hold her today and await a bed tomorrow. TOC will continue to follow during the course of hospitalization.     Expected Discharge Plan: IP Rehab Facility Barriers to Discharge: Other (comment) (waiting for bed at [facility])  Expected Discharge Plan and Services Expected Discharge Plan: [place]   Discharge Planning Services: CM Consult Post Acute Care Choice: [place] arrangements for the past 2 months: Single Family Home                                       Social Determinants of Health (SDOH) Interventions    Readmission Risk Interventions No flowsheet data found.

## 2020-11-02 NOTE — Progress Notes (Signed)
Occupational Therapy Treatment Patient Details Name: [name] [name] MRN: [id number] DOB: 1930/11/03 Today's Date: 11/02/2020    History of present illness Pt is 87 yo female admitted on 10/23/20 with hx of ex lap in Feb 2022, readmission Feb-March with complications, and continued to have issues with poor appetite, odynophagia, symptoms of fungal infection , and worsening weakness.  Pt now admitted with abdominal wall cellulitis (open wound).  Pt now on wound vac.  Pt with PMH including UIP on 2L O2, chronic prednisone, RA, osteoporosis, HTN, history of DVT/PE, mild CAD, and SBO s/p ex lap February 2022 with G tube placement since removed. PEG tube placed on 10/31/2020.   OT comments  Patient unable to demonstrate progress towards OT or pt's own goals compared to previous session.  Unfortunately, patient was limited by severe abdominal pain along with deficits noted below. Focused session on non-pharmalogical pain management methods with use of video and verbal instruction on relaxation techniques.  Pt with difficulty attending to instructions likely due to pain.  Pt given pain meds from nurse through PEG tube, but pt continued to ask to remain in bed due to pain and session was very limited. Pt continues to demonstrate fair rehab potential and would benefit from continued skilled OT to increase safety and independence with ADLs and functional transfers to allow pt to return home safely and reduce caregiver burden and fall risk.   Follow Up Recommendations  SNF    Equipment Recommendations  None recommended by OT    Recommendations for Other Services      Precautions / Restrictions Precautions Precautions: Fall Precaution Comments: NPWT abdomen Restrictions Weight Bearing Restrictions: No       Mobility Bed Mobility                    Transfers                      Balance                                           ADL either performed or assessed  with clinical judgement   ADL       Grooming: Wash/dry face;Bed level;Minimal assistance Grooming Details (indicate cue type and reason): Pt stated that she could not tolerate sitting EOB for grooming today due to abdominal pain.     Lower Body Bathing: Total assistance;Bed level (Applied lotion to dry appeared lower legs. Pt stated that the lotion was cold despite trying to warm up in hands and requested no further ADL activities.)                               Vision       Perception     Praxis      Cognition Arousal/Alertness: Awake/alert Behavior During Therapy: Flat affect                                   General Comments: Pt with decreased attention to task and education today due to pain. Pt closing eyes at times and turning away.        Exercises Other Exercises Other Exercises: Pt provided with cues and education for guided pain management medication at bed level.  Pt  given instructions on breathing, and progressive muscle relaxation for pain mngt. Pt clutching her abdomen at times. Poor attention to task, likely due to pain.  Pt was highly encouraged to change position and mobilize OOB, but pt stated that she could not tolerate this at present.   Shoulder Instructions       General Comments      Pertinent Vitals/ Pain       Pain Assessment: 0-10 Pain Score: 10-Worst pain ever Pain Location: ABD Pain Descriptors / Indicators: Sore;Grimacing;Guarding;Moaning Pain Intervention(s): Limited activity within patient's tolerance;RN gave pain meds during session;Patient requesting pain meds-RN notified;Other (comment);Relaxation  Home Living                                          Prior Functioning/Environment              Frequency  Min 2X/week        Progress Toward Goals  OT Goals(current goals can now be found in the care plan section)  Progress towards OT goals: Not progressing toward goals -  comment  Acute Rehab OT Goals Patient Stated Goal: Pain relief. OT Goal Formulation: With patient Time For Goal Achievement: 11/07/20 Potential to Achieve Goals: [place] Discharge plan remains appropriate    Co-evaluation                 AM-PAC OT "6 Clicks" Daily Activity     Outcome Measure   Help from another person eating meals?: A Little Help from another person taking care of personal grooming?: A Little Help from another person toileting, which includes using toliet, bedpan, or urinal?: A Lot Help from another person bathing (including washing, rinsing, drying)?: A Lot Help from another person to put on and taking off regular upper body clothing?: A Lot Help from another person to put on and taking off regular lower body clothing?: Total 6 Click Score: 13    End of Session Equipment Utilized During Treatment: Rolling walker;Gait belt  OT Visit Diagnosis: Unsteadiness on feet (R26.81);Other abnormalities of gait and mobility (R26.89);Pain;Muscle weakness (generalized) (M62.81);Other symptoms and signs involving cognitive function;History of falling (Z91.81);Feeding difficulties (R63.3) Pain - part of body:  (ABD)   Activity Tolerance Patient tolerated treatment well   Patient Left in chair;with call bell/phone within reach;with chair alarm set   Nurse Communication  (Nurse in room for majority of treatment.)        Time: 0823-0850 OT Time Calculation (min): 27 min  Charges: OT General Charges $OT Visit: 1 Visit OT Treatments $Self Care/Home Management : 23-37 mins  Anderson [place], [place] Office: [phone] 11/02/2020   Julien Girt 11/02/2020, 12:41 PM

## 2020-11-02 NOTE — Consult Note (Signed)
[facility] Nurse wound follow up Wound type: surgical; gtube site Measurement: 1. Distal midline: 6cm 4cm 0.3cm with undermining from 6-12 o'clock 2. Proximal midline: 2cm x 1cm x 0.2cm   Wound bed: both clean non granular; non healing Drainage (amount, consistency, odor) none Periwound: intact; scarring from denudation around the Gtube site; replaced 10/30/20; continues to leak  Dressing procedure/placement/frequency: Removed old NPWT dressing Periwound skin protected with skin barrier wipe Protected skin bridge between distal and proximal wounds with VAC drape Filled wound with  __3_ piece of black foam Sealed NPWT dressing at 175mm HG Patient received PO pain medication per bedside nurse prior to dressing change Patient tolerated procedure well Silicone foam x 2 used as drain sponge for stabilization and absorption of leaking gastric feeds   WOC nurse will continue to provide NPWT dressing changed due to the complexity of the dressing change.   [place], [place], [place]

## 2020-11-02 NOTE — Progress Notes (Signed)
PROGRESS NOTE    [name] [name]  [id number] DOB: 1930-12-02 DOA: 10/23/2020 PCP: Hoyt Koch, MD  Brief Narrative:  The patient is an 87 year old elderly female with a past medical history significant for but not limited to UIP on 2 L of supplemental oxygen via nasal cannula, chronic prednisone, rheumatoid arthritis, history of DVT and PE, mild CAD, history of SBO status post exploratory lap in February 2022 with G-tube placement w       ho has since had it removed who returned to the ED on 10/23/2020 with worsening abdominal pain and wound drainage.  She was admitted for a few weeks in February and March [id number] for complications after ex lap requiring TPN and G-tube placement which was then ultimately removed.  Patient discharged to SNF for less than 2 weeks and then returned home with 24-hour care with close surgery follow-up.  She was then referred to wound care center and has been having a wound RN from [facility] performing twice daily wound dressings.  She is continued struggle for weeks with poor appetite, and now odynophagia and symptoms of fungal infection last few days she has been worsening with generalized weakness.  A superficial culture was taken of the wound and showed polymicrobial infection with plans to treat with Bactrim though the family stated that she is unlikely to swallow pills as it would upset her stomach.  In the ED she is noted to be tachypneic and tachycardic and found to be septic and given a sepsis protocol 30 cc/kg.  General surgery evaluated and managed and admitted this patient.  Currently she is status post 5 days of antibiotics and wound VAC has been applied to her mid abdominal wound and a G-tube is planned for placement today if possible.  Her appetite is extremely poor.  Palliative care has been called for goals of care discussion however family is clear in her wishes and want everything done currently.  PEG tube was replaced yesterday and dentistry evaluated  her mouth pain.  General surgery consulted dietitians for bolus feedings however dietitians do not feel that the goal to tolerate it so they are recommending continuing continuous feeds at this time with orders placed. Dentistry evaluated her mouth pain and they feel that her lower lip is sinking in her to lower implants causing ulcerations on both sides of her inner lip and they are recommending starting chlorhexidine rinses 3-4 times a day with swish and spit and using salt water rinses and allowing the patient to have 4 x 4 gauze to be placed on the upper side of her lip.  Repeat is negative for COVID.  Patient appears Medically stable and labs were stable and she was to SNF once bed is available and insurance authorization obtained however now PT OT recommending CIR so they have been consulted for further evaluation. CIR evaluating and have no beds today but may have a bed available tomorrow so she can be D/C'd then as they feel the patient has been appropriate for that level of care. Surgery evaluating and skin around G tube looks better and she has some leaking but it is controlled with a sponge. Surgery has deemed the patient surgically stable to D/C whenever bed is available.   Assessment & Plan:   Principal Problem:   Abdominal wall cellulitis Active Problems:   CAD (coronary artery disease)   RA (rheumatoid arthritis) (HCC)   Chronic respiratory failure with hypoxia (HCC)   Chronic diastolic CHF (congestive heart failure) ([place])  Iron deficiency anemia   Personal history of PE (pulmonary embolism)   Unintentional weight loss   ILD (interstitial lung disease) (HCC)   Protein-calorie malnutrition, severe   Severe sepsis ([place])   Encounter for dental examination   Facial swelling   Atrophy of edentulous alveolar ridge  Severe Sepsis, poA, improved -The patient was tachypneic, tachycardic with leukocytosis and increasing drainage from abdominal wound suggestive of cellulitis. Lactic acid  2.7 indicating end organ dysfunction. -Aim to complete antibiotics after 5 days. Based on culture data and hoping to minimize nephrotoxins (e.g. zosyn, vancomycin), will continue bactrim > changed to PO now that po intake improving. Bactrim is now complte -Continuing to  Monitor culture data. -She is Afebrile with a TMax of 98.5 but WBC went up  Briefly and is now gone from 9.5 -> 11.9 -> 10.7 -> 9.6 -> 11.4 -> 12.3 -> 11.2 and could be in the setting of pain versus if this is in the setting of steroids -Lactic Acid Normal at 1.1; If WBC continues to elevate further will obtain blood cultures x2, urinalysis and urine culture as well as a chest x-ray in the morning -Continue to monitor for signs and symptoms of infection and she did have a diarrheal episode but will need to continue to Monitor   Thrush, mouth pain:  -Continue magic mouthwash, diflucan.  -C/w Benzocaine Mouth/Throat Pain 4 Times Daily prN -Mouth pain also related to dental implant screws with irritation.  -Recommend dentistry follow up as outpatient however pain is severe so have asked Dr. Benson Norway to evaluate; Dr. Benson Norway evaluated and felt that her lower lip is sinking in her to lower implants causing ulcerations on both sides of her inner lip and they are recommending starting chlorhexidine rinses 3-4 times a day with swish and spit and using salt water rinses and allowing the patient to have 4 x 4 gauze to be placed on the upper side of her lip. -Given pain and because her lip started swelling yesterday will get CT Maxillofacial to evaluate further -CT maxillofacial showed "Fairly diffuse mucosal enhancement within the oral cavity. Findings likely reflect sequela of reported thrush/mucositis. The patient is edentulous, although there are several metallic dental prostheses. Left sphenoid sinusitis"  -Lip Swelling improved with low dose Benadryl -Appreciated dentistry evaluation; Follow Recc's as above and outpatient Dental Follow up    Nonhealing abdominal wounds, MASD:  -Unable to stop prednisone. Remains on 5 mg po Daily with Breakfast  -Optimize nutrition. Initiate tube feeds if and once G tube inserted. - Wound care per general surgery > initiate wound vac   - Continue with barrier cream but Surgery has increased this to BID  Dehydration Severe protein-calorie malnutrition, failure to thrive, generalized weakness:  -Appears emaciated. Ketonuria without glucosuria.  -Dietitian consulted. Prealbumin is 8.  Interventional radiology was consulted and she had a PEG tube placement through her old G site. -SLP evaluation > mild aspiration risk, unable to chew so dysphagia 1 diet, thin liquids recommended. No dysphagia noted.  -C/w Creon 36,000 units po TID before meals  -C/w with Mirtazepine 15 gmg po qHS for appetite stimulation. -Continued IVF and now stopped. Last EF preserved, indeterminate diastolic function, and no evidence of volume overload.  -C/w Prosource Pluse 30 mL po TID and Boost Breeze 1 Contatiner po TID between meals; Per Dietitian "if IR able to place PEG, recommend Osmolite 1.2 @ 20 ml/hr to advance by 10 ml every 24 hours to reach goal rate of 50 ml/hr with 45  ml Prosource TF once/day and 100 ml free water QID."  Tube feedings have been resumed and general surgery wanted to place the patient on bolus feedings however dietary does not feel that the patient will be able to tolerate this -PT, OT, TOC consulted -Palliative Consulted for GOC Discussion and will continue current plan of care and will continue aggressive measures -Plan is for patient to go to Rehab and specifically at CIR as she has been accepted but bed availability is pending   Acute Delirium -Improved. -C/w Delirium precautions, prefer to avoid sedating agents as much as possible. -Continue to re-orient if needed   Hyperkalemia, improved AKI, improved -Improved.  -Lokelma given with resolution. -Initiated on IVF but this has now  been discontinued -Patient's potassium this morning 3.8 and improved to 4.5  -Patient's BUN/Creatinine is now stable at 10/0.51 -Will need continued monitoring while on bactrim. -Avoid nephrotoxic medications, contrast dyes, hypotension and renally dose medications -Repeat CMP in the AM   Hypokalemia, hypomagnesemia, hypophosphatemia:  -At risk of refeeding syndrome, continue checking regularly and supplementing as indicated. -Her K+ is now 4.5, mag level is 1.8 and Phos Level today was 3.3 -Electrolytes are being repleted and she is given IV mag sulfate 2 g today again  -Continue monitor and trend and replete as necessary -Repeat CMP in a.m.  Hyponatremia -Mild. Patient's Na+ has gone from 137 -> 135 -> 133 -Will give a 500 mL bolus -Continue to Monitor and Trend -Repeat CMP in the AM   Chronic Hypoxic Respiratory Failure due to UIP:  -No infiltrate on CXR. At baseline. Included portions of lungs on CT abd are stable without acute findings. - Continue 2L O2. -SpO2: 97 % O2 Flow Rate (L/min): 4 L/min and weaned to home regimen -Continue prednisone -Restarted Mycophenolate 1000 mg po BID and Nintendanib on 10/27/20 given low suspicion for severe infection. -Will continue to Monitor and Respiratory Status is stable   Goals of Care -These were introduced at admission; Given the combination of malnutrition, declining functional status, advanced age, and chronic immunosuppression suggest these wounds may never heal. The patient and family are very clear in their wishes at this time, however. They would like any and all available aggressive interventions with curative intent. This includes prolonged TPN, intensive wound care and physical therapy, IV antibiotics, and hospital monitoring.  -Full code confirmed. -Palliative care following as inpatient and Dr. Domingo Cocking to weigh in. They had an appointment with [facility] as outpatient pending.  Right Foot Drop -CT head without acute  intracranial abnormality. Suspect stretch peroneal nerve palsy.  -PT/OT recommending SNF but now recommending CIR; Will D/C to CIR when bed available  Right Foot Bunion  -Outpatient Podiatry Follow up and Management    Gallbladder distention, CBD Dilatation:  -Stable appearance on CT abd/pelvis 4/16 without inflammatory findings. LFTs wnl and no focal pain overlying this area. No further evaluation currently planned. -Follow up as an outpatient   Thrombocytopenia, improved  -Likely spurious result this morning as platelet count dropped to 96 from 210 yesterday; repeat CBC shows improvement and shows platelet count 230; will resume her VTE prophylaxis -Continue to monitor for signs and symptoms of bleeding; currently no overt bleeding noted  Normocytic Anemia -Relatively stable his hemoglobin/hematocrit is gone from 10.5/33.1 -> 11.6/37.0 -> 12.0/37.2 -> 11.1/35.1 -Check Anemia Panel in the AM  -Continue to monitor for signs and symptoms of bleeding; currently no overt bleeding noted -Repeat CBC in the AM  Elevated AST -Mild and likely reactive -Patient's  AST is now 57 -> 40  -> 48 -> 34 -Continue monitor and trend and if necessary will obtain a right upper quadrant ultrasound -Repeat CMP in the AM   COVID-19 Exposure -Per Dr. Chauncey Reading reported tested postive SARS-CoV-2 on 4/19 and last contact with the patient was 4/18 -Patient is Fully Vaccinated and Screend Negative on 4/15/ -ID recommendations per Dr. Baxter Flattery:   "Pt should wear mask ideally all the time, though with her chronic hypoxic respiratory failure, would recommend at least while caregivers are in the room. Door should remain closed.  - No aerosolizing medications are ordered and no aerosolizing procedures are scheduled.  - No PPE/precautions need to be implemented for staff unless patient becomes symptomatic.  - Of course, no further visitation allowable for HCPOA. - Retest the patient for covid 3 days after last exposure:  This would be on 4/21." -Retesting of her COVID 19 was negative  DVT prophylaxis: Enoxaparin 30 mg sq q24h Code Status: FULL CODE  Family Communication: No family present at bedside but I called and spoke to [facility] over the phone Disposition Plan: Pending further clinical improvement and anticipate discharging to [facility] when bed is available  Status is: Inpatient  Remains inpatient appropriate because:Unsafe d/c plan, IV treatments appropriate due to intensity of illness or inability to take PO and Inpatient level of care appropriate due to severity of illness   Dispo: The patient is from: Home              Anticipated d/c is to: CIR              Patient currently is medically stable to d/c.   Difficult to place patient No  Consultants:   General Surgery  Palliative Care  Interventional Radiology   CIR   Procedures:   Procedure: 20 fr G tube placed through existing access.  Antimicrobials:  Anti-infectives (From admission, onward)   Start     Dose/Rate Route Frequency Ordered Stop   10/26/20 1300  fluconazole (DIFLUCAN) tablet 100 mg        100 mg Oral Daily 10/26/20 1145     10/26/20 1230  sulfamethoxazole-trimethoprim (BACTRIM DS) 800-160 MG per tablet 1 tablet        1 tablet Oral Every 12 hours 10/26/20 1145 10/27/20 2219   10/24/20 1600  fluconazole (DIFLUCAN) IVPB 100 mg  Status:  Discontinued        100 mg 50 mL/hr over 60 Minutes Intravenous Every 24 hours 10/23/20 2009 10/26/20 1145   10/24/20 1000  sulfamethoxazole-trimethoprim (BACTRIM) 160 mg in dextrose 5 % 250 mL IVPB  Status:  Discontinued        160 mg 260 mL/hr over 60 Minutes Intravenous Every 12 hours 10/23/20 2025 10/26/20 1145   10/23/20 2100  sulfamethoxazole-trimethoprim (BACTRIM) 160 mg in dextrose 5 % 250 mL IVPB        160 mg 260 mL/hr over 60 Minutes Intravenous  Once 10/23/20 2025 10/23/20 2220   10/23/20 1715  cefTRIAXone (ROCEPHIN) 2 g in sodium chloride 0.9 % 100 mL IVPB        2 g 200  mL/hr over 30 Minutes Intravenous  Once 10/23/20 1704 10/23/20 1805   10/23/20 1715  metroNIDAZOLE (FLAGYL) IVPB 500 mg  Status:  Discontinued        500 mg 100 mL/hr over 60 Minutes Intravenous  Once 10/23/20 1704 10/23/20 1937   10/23/20 1545  fluconazole (DIFLUCAN) tablet 200 mg  200 mg Oral  Once 10/23/20 1541 10/23/20 1608        Subjective: Seen and examined at bedside was laying in the bed side with again and she is alert and appeared calm but was complaining of some jaw pain and mouth pain again.  No nausea or vomiting.  Denies any lightheadedness or dizziness.  No other concerns or complaints at this time and happy that her numbers are stable.  Objective: Vitals:   11/01/20 1318 11/01/20 1958 11/02/20 0413 11/02/20 1350  BP: 97/61 94/71 (!) 98/57 (!) 83/53  Pulse: 88 81 78 85  Resp: 16 20 18 14   Temp: 98.3 F (36.8 C) 98.2 F (36.8 C) 98.1 F (36.7 C) 98 F (36.7 C)  TempSrc:   Oral   SpO2: 97% 100% 100% 97%  Weight:   40 kg   Height:        Intake/Output Summary (Last 24 hours) at 11/02/2020 1359 Last data filed at 11/02/2020 1305 Gross per 24 hour  Intake 3139 ml  Output --  Net 3139 ml   Filed Weights   10/31/20 0402 11/01/20 0400 11/02/20 0413  Weight: 38.4 kg 39.4 kg 40 kg   Examination: Physical Exam:  Constitutional: The patient is extremely thin acute icteric elderly female laying in bed still complain about mouth pain Eyes: Lids and conjunctivae normal, sclerae anicteric  ENMT: External Ears, Nose appear normal. Grossly normal hearing.  Neck: Appears normal, supple, no cervical masses, normal ROM, no appreciable thyromegaly; no JVD Respiratory: Diminished to auscultation bilaterally with coarse breath sounds, no wheezing, rales, rhonchi or crackles. Normal respiratory effort and patient is not tachypenic. No accessory muscle use.  Unlabored breathing and wearing supplemental oxygen via nasal cannula Cardiovascular: RRR, no murmurs / rubs /  gallops. S1 and S2 auscultated.  No appreciable extremity edema Abdomen: Soft, non-tender, non-distended.  PEG is in place with skin maceration noted and has a midline abdominal incision that was connected to wound VAC with 2 wounds. bowel sounds positive.  GU: Deferred. Musculoskeletal: No clubbing / cyanosis of digits/nails. No joint deformity upper and lower extremities.  Skin: Has a midline abdominal incision connected to wound VAC no induration; Warm and dry.  Neurologic: CN 2-12 grossly intact with no focal deficits. Romberg sign and cerebellar reflexes not assessed.  Psychiatric: Normal judgment and insight. Alert and oriented x 3.  Mildly anxious mood and appropriate affect.   Data Reviewed: I have personally reviewed following labs and imaging studies  CBC: Recent Labs  Lab 10/30/20 0510 10/31/20 0513 11/01/20 0510 11/01/20 1107 11/02/20 0538  WBC 9.6 11.4* 11.7* 12.3* 11.2*  NEUTROABS 4.1 5.3 5.5 6.2 5.2  HGB 10.5* 11.6* 12.0 11.1* 11.1*  HCT 33.1* 37.0 37.2 35.1* 35.9*  MCV 98.5 98.7 96.1 97.8 100.6*  PLT 213 210 96* 230 229   Basic Metabolic Panel: Recent Labs  Lab 10/29/20 0508 10/30/20 0510 10/30/20 1450 10/30/20 1656 10/31/20 0513 10/31/20 1713 11/01/20 0510 11/02/20 0538  NA 136 136  --   --  137  --  135 133*  K 4.3 4.1  --   --  3.8  --  4.4 4.5  CL 102 105  --   --  101  --  99 98  CO2 26 23  --   --  27  --  27 29  GLUCOSE 77 101*  --   --  101*  --  87 100*  BUN 11 15  --   --  11  --  9 10  CREATININE 0.62 0.68  --   --  0.54  --  0.47 0.51  CALCIUM 8.6* 8.3*  --   --  8.9  --  9.0 8.7*  MG 1.9 1.7   < > 2.1 1.8 1.6* 1.6* 1.8  PHOS 2.6 2.4*   < > 2.5 2.3* 2.4* 2.9 3.3   < > = values in this interval not displayed.   GFR: Estimated Creatinine Clearance: 30.7 mL/min (by C-G formula based on SCr of 0.51 mg/dL). Liver Function Tests: Recent Labs  Lab 10/29/20 0508 10/30/20 0510 10/31/20 0513 11/01/20 0510 11/02/20 0538  AST 57* 39 40 48* 34   ALT 32 27 29 32 28  ALKPHOS 94 83 92 85 82  BILITOT 0.5 0.5 0.4 0.4 0.4  PROT 5.7* 5.3* 6.2* 5.9* 6.0*  ALBUMIN 2.2* 2.2* 2.5* 2.2* 2.3*   No results for input(s): LIPASE, AMYLASE in the last 168 hours. No results for input(s): AMMONIA in the last 168 hours. Coagulation Profile: No results for input(s): INR, PROTIME in the last 168 hours. Cardiac Enzymes: No results for input(s): CKTOTAL, CKMB, CKMBINDEX, TROPONINI in the last 168 hours. BNP (last 3 results) Recent Labs    06/03/20 1001 09/29/20 1425  PROBNP 100 58.0   HbA1C: No results for input(s): HGBA1C in the last 72 hours. CBG: Recent Labs  Lab 11/01/20 2000 11/01/20 2351 11/02/20 0416 11/02/20 0802 11/02/20 1130  GLUCAP 105* 122* 113* 112* 121*   Lipid Profile: No results for input(s): CHOL, HDL, LDLCALC, TRIG, CHOLHDL, LDLDIRECT in the last 72 hours. Thyroid Function Tests: No results for input(s): TSH, T4TOTAL, FREET4, T3FREE, THYROIDAB in the last 72 hours. Anemia Panel: No results for input(s): VITAMINB12, FOLATE, FERRITIN, TIBC, IRON, RETICCTPCT in the last 72 hours. Sepsis Labs: Recent Labs  Lab 11/02/20 [id number]  LATICACIDVEN 1.1    Recent Results (from the past 240 hour(s))  Blood Culture (routine x 2)     Status: None   Collection Time: 10/23/20  3:50 PM   Specimen: Site Not Specified; Blood  Result Value Ref Range Status   Specimen Description   Final    SITE NOT SPECIFIED Performed at [facility] [address], [place], [place] [address]    Special Requests   Final    BOTTLES DRAWN AEROBIC AND ANAEROBIC Blood Culture results may not be optimal due to an inadequate volume of blood received in culture bottles Performed at [facility] [address] [place], [place] [address]    Culture   Final    NO GROWTH 5 DAYS Performed at [facility], [place] [address] [place], [place] [address]    Report Status 10/28/2020 FINAL  Final  Blood Culture  (routine x 2)     Status: None   Collection Time: 10/23/20  4:00 PM   Specimen: BLOOD  Result Value Ref Range Status   Specimen Description   Final    BLOOD SITE NOT SPECIFIED Performed at [facility] [address] [place], [place] [address]    Special Requests   Final    BOTTLES DRAWN AEROBIC AND ANAEROBIC Blood Culture results may not be optimal due to an inadequate volume of blood received in culture bottles Performed at [facility] [address] [place], [place] [address]    Culture   Final    NO GROWTH 5 DAYS Performed at [facility], [place] [address] [place], [place] [address]  Report Status 10/28/2020 FINAL  Final  Resp Panel by RT-PCR (Flu A&B, Covid) Nasopharyngeal Swab     Status: None   Collection Time: 10/23/20  5:34 PM   Specimen: Nasopharyngeal Swab; Nasopharyngeal(NP) swabs in vial transport medium  Result Value Ref Range Status   SARS Coronavirus 2 by RT PCR NEGATIVE NEGATIVE Final    Comment: (NOTE) SARS-CoV-2 target nucleic acids are NOT DETECTED.  The SARS-CoV-2 RNA is generally detectable in upper respiratory specimens during the acute phase of infection. The lowest concentration of SARS-CoV-2 viral copies this assay can detect is 138 copies/mL. A negative result does not preclude SARS-Cov-2 infection and should not be used as the sole basis for treatment or other patient management decisions. A negative result may occur with  improper specimen collection/handling, submission of specimen other than nasopharyngeal swab, presence of viral mutation(s) within the areas targeted by this assay, and inadequate number of viral copies(<138 copies/mL). A negative result must be combined with clinical observations, patient history, and epidemiological information. The expected result is Negative.  Fact Sheet for Patients:  EntrepreneurPulse.com.au  Fact Sheet for Healthcare Providers:   IncredibleEmployment.be  This test is no t yet approved or cleared by the [place] FDA and  has been authorized for detection and/or diagnosis of SARS-CoV-2 by FDA under an Emergency Use Authorization (EUA). This EUA will remain  in effect (meaning this test can be used) for the duration of the COVID-19 declaration under Section 564(b)(1) of the Act, 21 U.S.C.section 360bbb-3(b)(1), unless the authorization is terminated  or revoked sooner.       Influenza A by PCR NEGATIVE NEGATIVE Final   Influenza B by PCR NEGATIVE NEGATIVE Final    Comment: (NOTE) The Xpert Xpress SARS-CoV-2/FLU/RSV plus assay is intended as an aid in the diagnosis of influenza from Nasopharyngeal swab specimens and should not be used as a sole basis for treatment. Nasal washings and aspirates are unacceptable for Xpert Xpress SARS-CoV-2/FLU/RSV testing.  Fact Sheet for Patients: EntrepreneurPulse.com.au  Fact Sheet for Healthcare Providers: IncredibleEmployment.be  This test is not yet approved or cleared by the [place] FDA and has been authorized for detection and/or diagnosis of SARS-CoV-2 by FDA under an Emergency Use Authorization (EUA). This EUA will remain in effect (meaning this test can be used) for the duration of the COVID-19 declaration under Section 564(b)(1) of the Act, 21 U.S.C. section 360bbb-3(b)(1), unless the authorization is terminated or revoked.  Performed at [facility], [place] [address], [place], [place] [address]   SARS CORONAVIRUS 2 (TAT 6-24 HRS) Nasopharyngeal Nasopharyngeal Swab     Status: None   Collection Time: 10/30/20  1:53 AM   Specimen: Nasopharyngeal Swab  Result Value Ref Range Status   SARS Coronavirus 2 NEGATIVE NEGATIVE Final    Comment: (NOTE) SARS-CoV-2 target nucleic acids are NOT DETECTED.  The SARS-CoV-2 RNA is generally detectable in upper and lower respiratory specimens  during the acute phase of infection. Negative results do not preclude SARS-CoV-2 infection, do not rule out co-infections with other pathogens, and should not be used as the sole basis for treatment or other patient management decisions. Negative results must be combined with clinical observations, patient history, and epidemiological information. The expected result is Negative.  Fact Sheet for Patients: SugarRoll.be  Fact Sheet for Healthcare Providers: https://www.woods-mathews.com/  This test is not yet approved or cleared by the [place] FDA and  has been authorized for detection and/or diagnosis of SARS-CoV-2 by FDA under an Emergency Use  Authorization (EUA). This EUA will remain  in effect (meaning this test can be used) for the duration of the COVID-19 declaration under Se ction 564(b)(1) of the Act, 21 U.S.C. section 360bbb-3(b)(1), unless the authorization is terminated or revoked sooner.  Performed at [facility], [place] [address], [place], [place] [address]    RN Pressure Injury Documentation: Pressure Injury 09/05/20 Sacrum Mid Stage 2 -  Partial thickness loss of dermis presenting as a shallow open injury with a red, pink wound bed without slough. (Active)  09/05/20 2150  Location: Sacrum  Location Orientation: Mid  Staging: Stage 2 -  Partial thickness loss of dermis presenting as a shallow open injury with a red, pink wound bed without slough.  Wound Description (Comments):   Present on Admission:      Pressure Injury 09/05/20 Sacrum Left Stage 2 -  Partial thickness loss of dermis presenting as a shallow open injury with a red, pink wound bed without slough. (Active)  09/05/20 2150  Location: Sacrum  Location Orientation: Left  Staging: Stage 2 -  Partial thickness loss of dermis presenting as a shallow open injury with a red, pink wound bed without slough.  Wound Description (Comments):   Present on  Admission:    Estimated body mass index is 14.9 kg/m as calculated from the following:   Height as of this encounter: 5' 4.5" (1.638 m).   Weight as of this encounter: 40 kg.  Malnutrition Type:  Nutrition Problem: Severe Malnutrition Etiology: chronic illness  Malnutrition Characteristics:  Signs/Symptoms: severe fat depletion,severe muscle depletion,percent weight loss Percent weight loss: 13.5 %  Nutrition Interventions:  Interventions: Tube feeding   Radiology Studies: No results found.  Scheduled Meds: . (feeding supplement) PROSource Plus  30 mL Oral Daily  . chlorhexidine  15 mL Mouth/Throat QID  . enoxaparin (LOVENOX) injection  30 mg Subcutaneous Daily  . famotidine  20 mg Per Tube Daily  . feeding supplement  1 Container Oral TID BM  . fluconazole  100 mg Oral Daily  . Gerhardt's butt cream   Topical BID  . lipase/protease/amylase  36,000 Units Oral TID AC  . magic mouthwash w/lidocaine  5 mL Oral TID  . mirtazapine  15 mg Oral QHS  . multivitamin with minerals  1 tablet Oral Daily  . mycophenolate  1,000 mg Oral BID  . Nintedanib  100 mg Oral BID  . predniSONE  5 mg Oral Q breakfast   Continuous Infusions: . feeding supplement (OSMOLITE 1.2 CAL) 1,000 mL (11/02/20 1134)  . sodium chloride      LOS: 10 days   Kerney Elbe, DO [facility] PAGER is on [place]  If 7PM-7AM, please contact night-coverage [email or web address]

## 2020-11-02 NOTE — Progress Notes (Signed)
Subjective: CC: Did not eat anything from her morning tray yet. Tolerating TF's without n/v. Seen with [facility]. They reports skin around g-tube is looking better. Still some leaking from g-tube but sponge around g-tube controlled this.   Objective: Vital signs in last 24 hours: Temp:  [98.1 F (36.7 C)-98.3 F (36.8 C)] 98.1 F (36.7 C) (04/25 0413) Pulse Rate:  [78-88] 78 (04/25 0413) Resp:  [16-20] 18 (04/25 0413) BP: (94-98)/(57-71) 98/57 (04/25 0413) SpO2:  [97 %-100 %] 100 % (04/25 0413) Weight:  [40 kg] 40 kg (04/25 0413) Last BM Date: 11/01/20  Intake/Output from previous day: 04/24 0701 - 04/25 0700 In: 2764 [P.O.:200; NG/GT:2564] Out: -  Intake/Output this shift: Total I/O In: 67 [P.O.:355] Out: -   PE: Abd: soft, cachetic, midline wound with as noted below. There is granulation tissue at the base of both wounds. They do not connect. No tunneling or drainage. G-tube with skin maceration as noted below.      Lab Results:  Recent Labs    11/01/20 1107 11/02/20 0538  WBC 12.3* 11.2*  HGB 11.1* 11.1*  HCT 35.1* 35.9*  PLT 230 229   BMET Recent Labs    11/01/20 0510 11/02/20 0538  NA 135 133*  K 4.4 4.5  CL 99 98  CO2 27 29  GLUCOSE 87 100*  BUN 9 10  CREATININE 0.47 0.51  CALCIUM 9.0 8.7*   PT/INR No results for input(s): LABPROT, INR in the last 72 hours. CMP     Component Value Date/Time   NA 133 (L) 11/02/2020 0538   NA 138 04/29/2020 0948   K 4.5 11/02/2020 0538   CL 98 11/02/2020 0538   CO2 29 11/02/2020 0538   GLUCOSE 100 (H) 11/02/2020 0538   BUN 10 11/02/2020 0538   BUN 11 04/29/2020 0948   CREATININE 0.51 11/02/2020 0538   CREATININE 0.74 11/30/2015 1327   CALCIUM 8.7 (L) 11/02/2020 0538   PROT 6.0 (L) 11/02/2020 0538   PROT 6.5 04/29/2020 0948   ALBUMIN 2.3 (L) 11/02/2020 0538   ALBUMIN 3.8 04/29/2020 0948   AST 34 11/02/2020 0538   ALT 28 11/02/2020 0538   ALKPHOS 82 11/02/2020 0538   BILITOT 0.4 11/02/2020 0538    BILITOT 0.7 04/29/2020 0948   GFRNONAA >60 11/02/2020 0538   GFRAA 69 04/29/2020 0948   Lipase     Component Value Date/Time   LIPASE 24 10/16/2020 1659       Studies/Results: No results found.  Anti-infectives: Anti-infectives (From admission, onward)   Start     Dose/Rate Route Frequency Ordered Stop   10/26/20 1300  fluconazole (DIFLUCAN) tablet 100 mg        100 mg Oral Daily 10/26/20 1145     10/26/20 1230  sulfamethoxazole-trimethoprim (BACTRIM DS) 800-160 MG per tablet 1 tablet        1 tablet Oral Every 12 hours 10/26/20 1145 10/27/20 2219   10/24/20 1600  fluconazole (DIFLUCAN) IVPB 100 mg  Status:  Discontinued        100 mg 50 mL/hr over 60 Minutes Intravenous Every 24 hours 10/23/20 2009 10/26/20 1145   10/24/20 1000  sulfamethoxazole-trimethoprim (BACTRIM) 160 mg in dextrose 5 % 250 mL IVPB  Status:  Discontinued        160 mg 260 mL/hr over 60 Minutes Intravenous Every 12 hours 10/23/20 2025 10/26/20 1145   10/23/20 2100  sulfamethoxazole-trimethoprim (BACTRIM) 160 mg in dextrose 5 % 250 mL IVPB  160 mg 260 mL/hr over 60 Minutes Intravenous  Once 10/23/20 2025 10/23/20 2220   10/23/20 1715  cefTRIAXone (ROCEPHIN) 2 g in sodium chloride 0.9 % 100 mL IVPB        2 g 200 mL/hr over 30 Minutes Intravenous  Once 10/23/20 1704 10/23/20 1805   10/23/20 1715  metroNIDAZOLE (FLAGYL) IVPB 500 mg  Status:  Discontinued        500 mg 100 mL/hr over 60 Minutes Intravenous  Once 10/23/20 1704 10/23/20 1937   10/23/20 1545  fluconazole (DIFLUCAN) tablet 200 mg        200 mg Oral  Once 10/23/20 1541 10/23/20 1608       Assessment/Plan SPCM/FTT - Nutrition following. On continuous TFs given risk of refeeding syndrome per notes 4/22. Consider switching to bolus TF's closer to d/c Stomatitis - per medicine Thrush - Per medicine   Postop from ex lap with detorsion of SB with placement of g-tube on 08/23/20 by Dr. Zenia Resides Skin excoriation from leaking g-tube site -No  infection noted -Midline wound with VAC in place. -Gerhardt's butt cream for a barrier. Appreciate [facility] assistance with this.  -Cont TFs as above.  Calorie count -Patient is surgically stable for DC to SNF when medically stable. Currently looking at [place], TFs VTE - Lovenox ID - diflucan (oral thrush) Follow up - Dr. Zenia Resides   LOS: 10 days    Jillyn Ledger , [facility] 11/02/2020, 9:33 AM Please see Amion for pager number during day hours 7:00am-4:30pm

## 2020-11-03 ENCOUNTER — Inpatient Hospital Stay: Payer: Medicare Other | Admitting: Pulmonary Disease

## 2020-11-03 LAB — COMPREHENSIVE METABOLIC PANEL
ALT: 24 U/L (ref 0–44)
AST: 30 U/L (ref 15–41)
Albumin: 2.1 g/dL — ABNORMAL LOW (ref 3.5–5.0)
Alkaline Phosphatase: 76 U/L (ref 38–126)
Anion gap: 6 (ref 5–15)
BUN: 9 mg/dL (ref 8–23)
CO2: 25 mmol/L (ref 22–32)
Calcium: 8.3 mg/dL — ABNORMAL LOW (ref 8.9–10.3)
Chloride: 99 mmol/L (ref 98–111)
Creatinine, Ser: 0.44 mg/dL (ref 0.44–1.00)
GFR, Estimated: >60 mL/min (ref >60)
Glucose, Bld: 95 mg/dL (ref 70–99)
Potassium: 3.9 mmol/L (ref 3.5–5.1)
Sodium: 130 mmol/L — ABNORMAL LOW (ref 135–145)
Total Bilirubin: 0.5 mg/dL (ref 0.3–1.2)
Total Protein: 5.5 g/dL — ABNORMAL LOW (ref 6.5–8.1)

## 2020-11-03 LAB — FERRITIN: Ferritin: 970 ng/mL — ABNORMAL HIGH (ref 11–307)

## 2020-11-03 LAB — GLUCOSE, CAPILLARY
Glucose-Capillary: 119 mg/dL — ABNORMAL HIGH (ref 70–99)
Glucose-Capillary: 116 mg/dL — ABNORMAL HIGH (ref 70–99)
Glucose-Capillary: 126 mg/dL — ABNORMAL HIGH (ref 70–99)
Glucose-Capillary: 177 mg/dL — ABNORMAL HIGH (ref 70–99)
Glucose-Capillary: 130 mg/dL — ABNORMAL HIGH (ref 70–99)
Glucose-Capillary: 163 mg/dL — ABNORMAL HIGH (ref 70–99)

## 2020-11-03 LAB — CBC WITH DIFFERENTIAL/PLATELET
Abs Immature Granulocytes: 0.08 10*3/uL — ABNORMAL HIGH (ref 0.00–0.07)
Basophils Absolute: 0.1 10*3/uL (ref 0.0–0.1)
Basophils Relative: 1 %
Eosinophils Absolute: 0.1 10*3/uL (ref 0.0–0.5)
Eosinophils Relative: 1 %
HCT: 32.8 % — ABNORMAL LOW (ref 36.0–46.0)
Hemoglobin: 10.4 g/dL — ABNORMAL LOW (ref 12.0–15.0)
Immature Granulocytes: 1 %
Lymphocytes Relative: 40 %
Lymphs Abs: 4.3 10*3/uL — ABNORMAL HIGH (ref 0.7–4.0)
MCH: 31 pg (ref 26.0–34.0)
MCHC: 31.7 g/dL (ref 30.0–36.0)
MCV: 97.9 fL (ref 80.0–100.0)
Monocytes Absolute: 1 10*3/uL (ref 0.1–1.0)
Monocytes Relative: 10 %
Neutro Abs: 5.3 10*3/uL (ref 1.7–7.7)
Neutrophils Relative %: 47 %
Platelets: 231 10*3/uL (ref 150–400)
RBC: 3.35 MIL/uL — ABNORMAL LOW (ref 3.87–5.11)
RDW: 20.1 % — ABNORMAL HIGH (ref 11.5–15.5)
WBC: 10.8 10*3/uL — ABNORMAL HIGH (ref 4.0–10.5)
nRBC: 0 % (ref 0.0–0.2)

## 2020-11-03 LAB — FOLATE: Folate: 9.7 ng/mL (ref >5.9)

## 2020-11-03 LAB — IRON AND TIBC
Iron: 25 ug/dL — ABNORMAL LOW (ref 28–170)
Saturation Ratios: 14 % (ref 10.4–31.8)
TIBC: 180 ug/dL — ABNORMAL LOW (ref 250–450)
UIBC: 155 ug/dL

## 2020-11-03 LAB — VITAMIN B12: Vitamin B-12: 711 pg/mL (ref 180–914)

## 2020-11-03 LAB — RETICULOCYTES
Immature Retic Fract: 33 % — ABNORMAL HIGH (ref 2.3–15.9)
RBC.: 3.32 MIL/uL — ABNORMAL LOW (ref 3.87–5.11)
Retic Count, Absolute: 75 10*3/uL (ref 19.0–186.0)
Retic Ct Pct: 2.3 % (ref 0.4–3.1)

## 2020-11-03 LAB — MAGNESIUM: Magnesium: 1.7 mg/dL (ref 1.7–2.4)

## 2020-11-03 LAB — VITAMIN B1: Vitamin B1 (Thiamine): 95.7 nmol/L (ref 66.5–200.0)

## 2020-11-03 LAB — PHOSPHORUS: Phosphorus: 2.1 mg/dL — ABNORMAL LOW (ref 2.5–4.6)

## 2020-11-03 LAB — VITAMIN B6: Vitamin B6: <1 ug/L — ABNORMAL LOW (ref 3.4–65.2)

## 2020-11-03 MED ORDER — OSMOLITE 1.2 CAL PO LIQD
1000.0000 mL | ORAL | Status: DC
  Administered 2020-11-03 – 2020-11-04 (×2): 1000 mL
  Filled 2020-11-03 (×2): qty 1000

## 2020-11-03 MED ORDER — MAGNESIUM SULFATE 2 GM/50ML IV SOLN
2.0000 g | Freq: Once | INTRAVENOUS | Status: AC
  Administered 2020-11-03: 2 g via INTRAVENOUS
  Filled 2020-11-03: qty 50

## 2020-11-03 MED ORDER — POTASSIUM & SODIUM PHOSPHATES 280-160-250 MG PO PACK
1.0000 | PACK | Freq: Three times a day (TID) | ORAL | Status: AC
  Administered 2020-11-03 – 2020-11-04 (×3): 1 via ORAL
  Filled 2020-11-03 (×3): qty 1

## 2020-11-03 NOTE — Progress Notes (Signed)
Pt has refused all medication on my shift. Pt admits to being in pain but refusing to take any pain medication. Re-oriented pt and educated on use of medications but pt states "it is illegal to give me medicine when I don't want it". Offered to let her speak to her family, or have another nurse come administer meds but pt refuses. Pt repositioned for comfort. Pt was able to rest throughout the night.

## 2020-11-03 NOTE — Progress Notes (Signed)
Physical Therapy Treatment Patient Details Name: [name] [name] MRN: [id number] DOB: Jun 24, 1931 Today's Date: 11/03/2020    History of Present Illness Pt is 87 yo female admitted on 10/23/20 with hx of ex lap in Feb 2022, readmission Feb-March with complications, and continued to have issues with poor appetite, odynophagia, symptoms of fungal infection , and worsening weakness.  Pt now admitted with abdominal wall cellulitis (open wound).  Pt now on wound vac.  Pt with PMH including UIP on 2L O2, chronic prednisone, RA, osteoporosis, HTN, history of DVT/PE, mild CAD, and SBO s/p ex lap February 2022 with G tube placement since removed. PEG tube placed on 10/31/2020.    PT Comments    Patient is making limited progress with acute PT and has been limited by pain and poor activity tolerance. She was agreeable to mobilize to chair and attempt gait however after stand step transfer bed>chair pt too fatigued to continue. Patient also c/o abdominal pain and RN notified of need. She completed additional stand for brief to be donned and was able to stand for <1 minute before fatiguing and requiring seated rest. Acute PT will continue to progress pt as able.     Follow Up Recommendations  CIR     Equipment Recommendations  Wheelchair cushion (measurements PT);Wheelchair (measurements PT)    Recommendations for Other Services       Precautions / Restrictions Precautions Precautions: Fall Precaution Comments: NPWT abdomen Restrictions Weight Bearing Restrictions: No    Mobility  Bed Mobility Overal bed mobility: Needs Assistance Bed Mobility: Rolling;Sidelying to Sit Rolling: Min guard Sidelying to sit: Min assist       General bed mobility comments: Cues needed to initaite rolling to Rt side, min assist to raise trunk upright and bring LE's off EOB fully.    Transfers Overall transfer level: Needs assistance Equipment used: Rolling walker (2 wheeled) Transfers: Sit to/from [employer] Sit to Stand: Min assist Stand pivot transfers: Mod assist       General transfer comment: Cues needed for safety with rising from EOB. min assist to steady with rise. Pt's Rt LE resting in abducted position and pt unable to bring it within BOS/walker frame. Mod assist for safety with walker management and to guide direction of turn to sit in recliner. Pt fatgieud and greatly limited by pain with steps to chair.  Ambulation/Gait Ambulation/Gait assistance: Mod assist Gait Distance (Feet): 3 Feet Assistive device: Rolling walker (2 wheeled) Gait Pattern/deviations: Step-to pattern;Decreased stride length;Decreased dorsiflexion - right;Shuffle;Trunk flexed Gait velocity: decr   General Gait Details: pt required Mod assist to complete small side steps from EOB to recliner. pt unsteady and with poor safety awareness for RW management. Pt's Rt LE abducted and pt unable to control position to bring Rt foot underhip for improved posture/alignment.   Stairs             Wheelchair Mobility    Modified Rankin (Stroke Patients Only)       Balance Overall balance assessment: Needs assistance Sitting-balance support: Feet supported Sitting balance-[name]: Fair Sitting balance - Comments: reliant on UE support   Standing balance support: During functional activity;Bilateral upper extremity supported Standing balance-[name]: Poor Standing balance comment: reliant on UE support and min to mod A for safety                            Cognition Arousal/Alertness: Awake/alert Behavior During Therapy: Flat affect Overall  Cognitive Status: Within Functional Limits for tasks assessed                                        Exercises      General Comments        Pertinent Vitals/Pain Pain Assessment: Faces Faces Pain Scale: Hurts whole lot Pain Location: ABD Pain Descriptors / Indicators: Sore;Grimacing;Guarding;Moaning Pain  Intervention(s): Limited activity within patient's tolerance;Monitored during session;Repositioned;Patient requesting pain meds-RN notified    Home Living                      Prior Function            PT Goals (current goals can now be found in the care plan section) Acute Rehab PT Goals Patient Stated Goal: Pain relief. PT Goal Formulation: Patient unable to participate in goal setting Time For Goal Achievement: 11/07/20 Potential to Achieve Goals: Fair Progress towards PT goals: Not progressing toward goals - comment (slow/limited by pain and low activity tolerance)    Frequency    Min 3X/week (updated to progress to CIR possibly)      PT Plan Current plan remains appropriate    Co-evaluation              AM-PAC PT "6 Clicks" Mobility   Outcome Measure  Help needed turning from your back to your side while in a flat bed without using bedrails?: A Little Help needed moving from lying on your back to sitting on the side of a flat bed without using bedrails?: A Little Help needed moving to and from a bed to a chair (including a wheelchair)?: A Lot Help needed standing up from a chair using your arms (e.g., wheelchair or bedside chair)?: A Lot Help needed to walk in hospital room?: A Lot Help needed climbing 3-5 steps with a railing? : Total 6 Click Score: 13    End of Session Equipment Utilized During Treatment: Gait belt Activity Tolerance: Patient tolerated treatment well Patient left: in chair;with call bell/phone within reach;with chair alarm set;with family/visitor present Nurse Communication: Mobility status PT Visit Diagnosis: Other abnormalities of gait and mobility (R26.89);Muscle weakness (generalized) (M62.81)     Time: 6301-6010 PT Time Calculation (min) (ACUTE ONLY): 29 min  Charges:  $Therapeutic Activity: 23-37 mins                     Verner Mould, DPT Acute Rehabilitation Services Office [phone] Pager  [phone]     [name] [name] 11/03/2020, 2:02 PM

## 2020-11-03 NOTE — Progress Notes (Signed)
PROGRESS NOTE    [name] [name]  [id number] DOB: 17-Dec-1930 DOA: 10/23/2020 PCP: Hoyt Koch, MD  Brief Narrative:  The patient is an 87 year old elderly female with a past medical history significant for but not limited to UIP on 2 L of supplemental oxygen via nasal cannula, chronic prednisone, rheumatoid arthritis, history of DVT and PE, mild CAD, history of SBO status post exploratory lap in February 2022 with G-tube placement w       ho has since had it removed who returned to the ED on 10/23/2020 with worsening abdominal pain and wound drainage.  She was admitted for a few weeks in February and March [id number] for complications after ex lap requiring TPN and G-tube placement which was then ultimately removed.  Patient discharged to SNF for less than 2 weeks and then returned home with 24-hour care with close surgery follow-up.  She was then referred to wound care center and has been having a wound RN from [facility] performing twice daily wound dressings.  She is continued struggle for weeks with poor appetite, and now odynophagia and symptoms of fungal infection last few days she has been worsening with generalized weakness.  A superficial culture was taken of the wound and showed polymicrobial infection with plans to treat with Bactrim though the family stated that she is unlikely to swallow pills as it would upset her stomach.  In the ED she is noted to be tachypneic and tachycardic and found to be septic and given a sepsis protocol 30 cc/kg.  General surgery evaluated and managed and admitted this patient.  Currently she is status post 5 days of antibiotics and wound VAC has been applied to her mid abdominal wound and a G-tube is planned for placement today if possible.  Her appetite is extremely poor.  Palliative care has been called for goals of care discussion however family is clear in her wishes and want everything done currently.  PEG tube was replaced yesterday and dentistry evaluated  her mouth pain.  General surgery consulted dietitians for bolus feedings however dietitians do not feel that the goal to tolerate it so they are recommending continuing continuous feeds at this time with orders placed. Dentistry evaluated her mouth pain and they feel that her lower lip is sinking in her to lower implants causing ulcerations on both sides of her inner lip and they are recommending starting chlorhexidine rinses 3-4 times a day with swish and spit and using salt water rinses and allowing the patient to have 4 x 4 gauze to be placed on the upper side of her lip.  Repeat is negative for COVID.  Patient appears Medically stable and labs were stable and she was to SNF once bed is available and insurance authorization obtained however now PT OT recommending CIR so they have been consulted for further evaluation. CIR evaluating and have no beds again today but may have a bed available later this week so she can be D/C'd then as they feel the patient has been appropriate for that level of care. Surgery evaluating and skin around G tube looks better and she has some leaking but it is controlled with a sponge. Surgery has deemed the patient surgically stable to D/C whenever bed is available.  Family does not want to take the patient to SNF at all and only wants CIR.  Of note patient's tube feedings have been resumed however she is not at goal rate so I asked the nurse to advance per the dietitians  recommendations to get to goal. Remains stable to D/C to CIR once bed is available.   Assessment & Plan:   Principal Problem:   Abdominal wall cellulitis Active Problems:   CAD (coronary artery disease)   RA (rheumatoid arthritis) (HCC)   Chronic respiratory failure with hypoxia (HCC)   Chronic diastolic CHF (congestive heart failure) (HCC)   Iron deficiency anemia   Personal history of PE (pulmonary embolism)   Unintentional weight loss   ILD (interstitial lung disease) (HCC)   Protein-calorie  malnutrition, severe   Severe sepsis ([place])   Encounter for dental examination   Facial swelling   Atrophy of edentulous alveolar ridge  Severe Sepsis, poA, improved -The patient was tachypneic, tachycardic with leukocytosis and increasing drainage from abdominal wound suggestive of cellulitis. Lactic acid 2.7 indicating end organ dysfunction. -Aim to complete antibiotics after 5 days. Based on culture data and hoping to minimize nephrotoxins (e.g. zosyn, vancomycin), will continue bactrim > changed to PO now that po intake improving. Bactrim is now complte -Continuing to  Monitor culture data. -She is Afebrile with a TMax of 98.5 but WBC went up  Briefly and is now gone from 9.5 -> 11.9 -> 10.7 -> 9.6 -> 11.4 -> 12.3 -> 11.2 -> 10.8 and improving  -Lactic Acid Normal at 1.1; If WBC continues to elevate further will obtain blood cultures x2, urinalysis and urine culture as well as a chest x-ray in the morning -Continue to monitor for signs and symptoms of infection and she did have a diarrheal episode but will need to continue to Monitor   Thrush, mouth pain:  -Continue magic mouthwash, diflucan.  -C/w Benzocaine Mouth/Throat Pain 4 Times Daily prN -Mouth pain also related to dental implant screws with irritation.  -Recommend dentistry follow up as outpatient however pain is severe so have asked Dr. Benson Norway to evaluate; Dr. Benson Norway evaluated and felt that her lower lip is sinking in her to lower implants causing ulcerations on both sides of her inner lip and they are recommending starting chlorhexidine rinses 3-4 times a day with swish and spit and using salt water rinses and allowing the patient to have 4 x 4 gauze to be placed on the upper side of her lip. -Given pain and because her lip started swelling yesterday will get CT Maxillofacial to evaluate further -CT maxillofacial showed "Fairly diffuse mucosal enhancement within the oral cavity. Findings likely reflect sequela of reported  thrush/mucositis. The patient is edentulous, although there are several metallic dental prostheses. Left sphenoid sinusitis"  -Lip Swelling improved with low dose Benadryl -Appreciated dentistry evaluation; Follow Recc's as above and outpatient Dental Follow up   Nonhealing abdominal wounds, MASD:  -Unable to stop prednisone. Remains on 5 mg po Daily with Breakfast  -Optimize nutrition. Initiate tube feeds if and once G tube inserted. - Wound care per general surgery > initiate wound vac   - Continue with barrier cream but Surgery has increased this to BID  Dehydration Severe protein-calorie malnutrition, failure to thrive, generalized weakness:  -Appears emaciated. Ketonuria without glucosuria.  -Dietitian consulted. Prealbumin is 8.  Interventional radiology was consulted and she had a PEG tube placement through her old G site. -SLP evaluation > mild aspiration risk, unable to chew so dysphagia 1 diet, thin liquids recommended. No dysphagia noted.  -C/w Creon 36,000 units po TID before meals  -C/w with Mirtazepine 15 gmg po qHS for appetite stimulation. -Continued IVF and now stopped. Last EF preserved, indeterminate diastolic function, and no evidence of  volume overload.  -C/w Prosource Pluse 30 mL po TID and Boost Breeze 1 Contatiner po TID between meals; Per Dietitian "if IR able to place PEG, recommend Osmolite 1.2 @ 20 ml/hr to advance by 10 ml every 24 hours to reach goal rate of 50 ml/hr with 45 ml Prosource TF once/day and 100 ml free water QID."  Tube feedings have been resumed and general surgery wanted to place the patient on bolus feedings however dietary does not feel that the patient will be able to tolerate this; Now only on 20 mL/hr and have asked the nurse to advance  -PT, OT, TOC consulted -Palliative Consulted for GOC Discussion and will continue current plan of care and will continue aggressive measures -Plan is for patient to go to Rehab and specifically at CIR as she  has been accepted but bed availability is pending still and maybe able to be discharged later this week  Acute Delirium -Improved. -C/w Delirium precautions, prefer to avoid sedating agents as much as possible. -Continue to re-orient if needed   Hyperkalemia, improved AKI, improved -Improved.  -Lokelma given with resolution. -Initiated on IVF but this has now been discontinued -Patient's potassium this morning 3.9 -Patient's BUN/Creatinine is now stable at 9/0.44 -Will need continued monitoring while on bactrim. -Avoid nephrotoxic medications, contrast dyes, hypotension and renally dose medications -Repeat CMP in the AM   Hypokalemia, hypomagnesemia, hypophosphatemia:  -At risk of refeeding syndrome, continue checking regularly and supplementing as indicated. -Her K+ is now 3.9, mag level is 1.7 and Phos Level today was 2.1 -Electrolytes are being repleted and she is given IV mag sulfate 2 g today again and Phos-NAK  -Continue monitor and trend and replete as necessary -Repeat CMP, Mag, Phos in a.m.  Hyponatremia -Mild. Patient's Na+ has gone from 137 -> 135 -> 133 -> 130 -Given 500 mL/hr  -Replete po Phos-NAK  -Encourage oral intake  -Continue to Monitor and Trend -Repeat CMP in the AM   Chronic Hypoxic Respiratory Failure due to UIP:  -No infiltrate on CXR. At baseline. Included portions of lungs on CT abd are stable without acute findings. - Continue 2L O2. -SpO2: 94 % O2 Flow Rate (L/min): 4 L/min and weaned to home regimen -Continue prednisone -Restarted Mycophenolate 1000 mg po BID and Nintendanib on 10/27/20 given low suspicion for severe infection. -Will continue to Monitor and Respiratory Status is stable   Goals of Care -These were introduced at admission; Given the combination of malnutrition, declining functional status, advanced age, and chronic immunosuppression suggest these wounds may never heal. The patient and family are very clear in their wishes at  this time, however. They would like any and all available aggressive interventions with curative intent. This includes prolonged TPN, intensive wound care and physical therapy, IV antibiotics, and hospital monitoring.  -Full code confirmed. -Palliative care following as inpatient and Dr. Domingo Cocking to weigh in. They had an appointment with [facility] as outpatient pending.  Right Foot Drop -CT head without acute intracranial abnormality. Suspect stretch peroneal nerve palsy.  -PT/OT recommending SNF but now recommending CIR; Will D/C to CIR when bed available  Right Foot Bunion  -Outpatient Podiatry Follow up and Management    Gallbladder distention, CBD Dilatation:  -Stable appearance on CT abd/pelvis 4/16 without inflammatory findings. LFTs wnl and no focal pain overlying this area. No further evaluation currently planned. -Follow up as an outpatient   Thrombocytopenia, improved  -Likely spurious result this morning as platelet count dropped to 96 from 210 yesterday;  repeat CBC shows improvement and shows platelet count 231; will resume her VTE prophylaxis -Continue to monitor for signs and symptoms of bleeding; currently no overt bleeding noted  Normocytic Anemia -Relatively stable his hemoglobin/hematocrit is gone from 10.5/33.1 -> 11.6/37.0 -> 12.0/37.2 -> 11.1/35.1 -> 10.4/32.8 -Check Anemia Panel and showed an iron level of 25, U IBC 155, TIBC 180, saturation ratios of 14%, ferritin of 970, folate of 9.7, vitamin B12 of 711 -Continue to monitor for signs and symptoms of bleeding; currently no overt bleeding noted -Repeat CBC in the AM  Elevated AST -Mild and likely reactive -Patient's AST is now 57 -> 40  -> 48 -> 34 -> 30 -Continue monitor and trend and if necessary will obtain a right upper quadrant ultrasound -Repeat CMP in the AM   COVID-19 Exposure -Per Dr. Chauncey Reading reported tested postive SARS-CoV-2 on 4/19 and last contact with the patient was 4/18 -Patient is Fully  Vaccinated and Screend Negative on 4/15/ -ID recommendations per Dr. Baxter Flattery:   "Pt should wear mask ideally all the time, though with her chronic hypoxic respiratory failure, would recommend at least while caregivers are in the room. Door should remain closed.  - No aerosolizing medications are ordered and no aerosolizing procedures are scheduled.  - No PPE/precautions need to be implemented for staff unless patient becomes symptomatic.  - Of course, no further visitation allowable for HCPOA. - Retest the patient for covid 3 days after last exposure: This would be on 4/21." -Retesting of her COVID 19 was negative  DVT prophylaxis: Enoxaparin 30 mg sq q24h Code Status: FULL CODE  Family Communication: No family present at bedside but I called and spoke to [facility] over the phone yesterday  Disposition Plan: Pending further clinical improvement and anticipate discharging to CIR when bed is available  Status is: Inpatient  Remains inpatient appropriate because:Unsafe d/c plan, IV treatments appropriate due to intensity of illness or inability to take PO and Inpatient level of care appropriate due to severity of illness   Dispo: The patient is from: Home              Anticipated d/c is to: CIR              Patient currently is medically stable to d/c.   Difficult to place patient No  Consultants:   General Surgery  Palliative Care  Interventional Radiology   CIR   Procedures:   Procedure: 20 fr G tube placed through existing access.  Antimicrobials:  Anti-infectives (From admission, onward)   Start     Dose/Rate Route Frequency Ordered Stop   10/26/20 1300  fluconazole (DIFLUCAN) tablet 100 mg        100 mg Oral Daily 10/26/20 1145     10/26/20 1230  sulfamethoxazole-trimethoprim (BACTRIM DS) 800-160 MG per tablet 1 tablet        1 tablet Oral Every 12 hours 10/26/20 1145 10/27/20 2219   10/24/20 1600  fluconazole (DIFLUCAN) IVPB 100 mg  Status:  Discontinued        100  mg 50 mL/hr over 60 Minutes Intravenous Every 24 hours 10/23/20 2009 10/26/20 1145   10/24/20 1000  sulfamethoxazole-trimethoprim (BACTRIM) 160 mg in dextrose 5 % 250 mL IVPB  Status:  Discontinued        160 mg 260 mL/hr over 60 Minutes Intravenous Every 12 hours 10/23/20 2025 10/26/20 1145   10/23/20 2100  sulfamethoxazole-trimethoprim (BACTRIM) 160 mg in dextrose 5 % 250 mL IVPB  160 mg 260 mL/hr over 60 Minutes Intravenous  Once 10/23/20 2025 10/23/20 2220   10/23/20 1715  cefTRIAXone (ROCEPHIN) 2 g in sodium chloride 0.9 % 100 mL IVPB        2 g 200 mL/hr over 30 Minutes Intravenous  Once 10/23/20 1704 10/23/20 1805   10/23/20 1715  metroNIDAZOLE (FLAGYL) IVPB 500 mg  Status:  Discontinued        500 mg 100 mL/hr over 60 Minutes Intravenous  Once 10/23/20 1704 10/23/20 1937   10/23/20 1545  fluconazole (DIFLUCAN) tablet 200 mg        200 mg Oral  Once 10/23/20 1541 10/23/20 1608        Subjective: Seen and examined at bedside and was laying in bed resting and nursing has been stating that she is been refusing her medications.  No nausea or vomiting but continues to complain of mouth pain.  No other concerns or complaints at this time.  Her tube feedings were not at goal when I evaluated her pulm this morning and so have asked nursing to increase to goal rate per dietitian recommendation.  Objective: Vitals:   11/02/20 2019 11/02/20 2230 11/03/20 0404 11/03/20 0500  BP: (!) 90/52 (!) 92/54 98/60   Pulse: 90  89   Resp: 18  18   Temp: 100.2 F (37.9 C) 98.2 F (36.8 C) 99.3 F (37.4 C)   TempSrc: Oral Oral Oral   SpO2: 97%  94%   Weight:    42.7 kg  Height:        Intake/Output Summary (Last 24 hours) at 11/03/2020 1206 Last data filed at 11/02/2020 1927 Gross per 24 hour  Intake 2535 ml  Output --  Net 2535 ml   Filed Weights   11/01/20 0400 11/02/20 0413 11/03/20 0500  Weight: 39.4 kg 40 kg 42.7 kg   Examination: Physical Exam:  Constitutional: Patient is  an extremely thin cachectic elderly female laying in bed in NAD Eyes: Lids and conjunctivae normal, sclerae anicteric  ENMT: External Ears, Nose appear normal. Grossly normal hearing.  Neck: Appears normal, supple, no cervical masses, normal ROM, no appreciable thyromegaly; no JVD Respiratory: Diminished to auscultation bilaterally, no wheezing, rales, rhonchi or crackles. Normal respiratory effort and patient is not tachypenic. No accessory muscle use.  Cardiovascular: RRR, no murmurs / rubs / gallops. S1 and S2 auscultated. No extremity edema. Abdomen: Soft, mildly-tender, non-distended. PEG in place with skin maceration noted and has a wound vac in place.  Bowel sounds positive.  GU: Deferred. Musculoskeletal: No clubbing / cyanosis of digits/nails. No joint deformity upper and lower extremities.  Skin: No rashes, lesions, ulcers on a limited skin evaluation. No induration; Warm and dry.  Neurologic: CN 2-12 grossly intact with no focal deficits. Romberg sign and cerebellar reflexes not assessed.  Psychiatric: Normal judgment and insight. Alert and oriented x 3. Normal mood and appropriate affect.   Data Reviewed: I have personally reviewed following labs and imaging studies  CBC: Recent Labs  Lab 10/31/20 0513 11/01/20 0510 11/01/20 1107 11/02/20 0538 11/03/20 0521  WBC 11.4* 11.7* 12.3* 11.2* 10.8*  NEUTROABS 5.3 5.5 6.2 5.2 5.3  HGB 11.6* 12.0 11.1* 11.1* 10.4*  HCT 37.0 37.2 35.1* 35.9* 32.8*  MCV 98.7 96.1 97.8 100.6* 97.9  PLT 210 96* 230 229 517   Basic Metabolic Panel: Recent Labs  Lab 10/30/20 0510 10/30/20 1450 10/31/20 0513 10/31/20 1713 11/01/20 0510 11/02/20 0538 11/03/20 0521  NA 136  --  137  --  135 133* 130*  K 4.1  --  3.8  --  4.4 4.5 3.9  CL 105  --  101  --  99 98 99  CO2 23  --  27  --  27 29 25   GLUCOSE 101*  --  101*  --  87 100* 95  BUN 15  --  11  --  9 10 9   CREATININE 0.68  --  0.54  --  0.47 0.51 0.44  CALCIUM 8.3*  --  8.9  --  9.0 8.7*  8.3*  MG 1.7   < > 1.8 1.6* 1.6* 1.8 1.7  PHOS 2.4*   < > 2.3* 2.4* 2.9 3.3 2.1*   < > = values in this interval not displayed.   GFR: Estimated Creatinine Clearance: 32.8 mL/min (by C-G formula based on SCr of 0.44 mg/dL). Liver Function Tests: Recent Labs  Lab 10/30/20 0510 10/31/20 0513 11/01/20 0510 11/02/20 0538 11/03/20 0521  AST 39 40 48* 34 30  ALT 27 29 32 28 24  ALKPHOS 83 92 85 82 76  BILITOT 0.5 0.4 0.4 0.4 0.5  PROT 5.3* 6.2* 5.9* 6.0* 5.5*  ALBUMIN 2.2* 2.5* 2.2* 2.3* 2.1*   No results for input(s): LIPASE, AMYLASE in the last 168 hours. No results for input(s): AMMONIA in the last 168 hours. Coagulation Profile: No results for input(s): INR, PROTIME in the last 168 hours. Cardiac Enzymes: No results for input(s): CKTOTAL, CKMB, CKMBINDEX, TROPONINI in the last 168 hours. BNP (last 3 results) Recent Labs    06/03/20 1001 09/29/20 1425  PROBNP 100 58.0   HbA1C: Recent Labs    11/02/20 0538  HGBA1C 5.5   CBG: Recent Labs  Lab 11/02/20 2021 11/02/20 2321 11/03/20 0406 11/03/20 0728 11/03/20 1145  GLUCAP 141* 110* 119* 116* 126*   Lipid Profile: No results for input(s): CHOL, HDL, LDLCALC, TRIG, CHOLHDL, LDLDIRECT in the last 72 hours. Thyroid Function Tests: No results for input(s): TSH, T4TOTAL, FREET4, T3FREE, THYROIDAB in the last 72 hours. Anemia Panel: Recent Labs    11/03/20 0521  VITAMINB12 711  FOLATE 9.7  FERRITIN 970*  TIBC 180*  IRON 25*  RETICCTPCT 2.3   Sepsis Labs: Recent Labs  Lab 11/02/20 2025  LATICACIDVEN 1.1    Recent Results (from the past 240 hour(s))  SARS CORONAVIRUS 2 (TAT 6-24 HRS) Nasopharyngeal Nasopharyngeal Swab     Status: None   Collection Time: 10/30/20  1:53 AM   Specimen: Nasopharyngeal Swab  Result Value Ref Range Status   SARS Coronavirus 2 NEGATIVE NEGATIVE Final    Comment: (NOTE) SARS-CoV-2 target nucleic acids are NOT DETECTED.  The SARS-CoV-2 RNA is generally detectable in upper and  lower respiratory specimens during the acute phase of infection. Negative results do not preclude SARS-CoV-2 infection, do not rule out co-infections with other pathogens, and should not be used as the sole basis for treatment or other patient management decisions. Negative results must be combined with clinical observations, patient history, and epidemiological information. The expected result is Negative.  Fact Sheet for Patients: SugarRoll.be  Fact Sheet for Healthcare Providers: https://www.woods-mathews.com/  This test is not yet approved or cleared by the [place] FDA and  has been authorized for detection and/or diagnosis of SARS-CoV-2 by FDA under an Emergency Use Authorization (EUA). This EUA will remain  in effect (meaning this test can be used) for the duration of the COVID-19 declaration under Se ction 564(b)(1) of the Act, 21 U.S.C. section 360bbb-3(b)(1), unless the  authorization is terminated or revoked sooner.  Performed at [facility], [place] [address] [place], [place] [address]    RN Pressure Injury Documentation: Pressure Injury 09/05/20 Sacrum Mid Stage 2 -  Partial thickness loss of dermis presenting as a shallow open injury with a red, pink wound bed without slough. (Active)  09/05/20 2150  Location: Sacrum  Location Orientation: Mid  Staging: Stage 2 -  Partial thickness loss of dermis presenting as a shallow open injury with a red, pink wound bed without slough.  Wound Description (Comments):   Present on Admission:      Pressure Injury 09/05/20 Sacrum Left Stage 2 -  Partial thickness loss of dermis presenting as a shallow open injury with a red, pink wound bed without slough. (Active)  09/05/20 2150  Location: Sacrum  Location Orientation: Left  Staging: Stage 2 -  Partial thickness loss of dermis presenting as a shallow open injury with a red, pink wound bed without slough.  Wound Description  (Comments):   Present on Admission:    Estimated body mass index is 15.91 kg/m as calculated from the following:   Height as of this encounter: 5' 4.5" (1.638 m).   Weight as of this encounter: 42.7 kg.  Malnutrition Type:  Nutrition Problem: Severe Malnutrition Etiology: chronic illness  Malnutrition Characteristics:  Signs/Symptoms: severe fat depletion,severe muscle depletion,percent weight loss Percent weight loss: 13.5 %  Nutrition Interventions:  Interventions: Tube feeding   Radiology Studies: No results found.  Scheduled Meds: . (feeding supplement) PROSource Plus  30 mL Oral Daily  . chlorhexidine  15 mL Mouth/Throat QID  . enoxaparin (LOVENOX) injection  30 mg Subcutaneous Daily  . famotidine  20 mg Per Tube Daily  . feeding supplement  1 Container Oral TID BM  . fluconazole  100 mg Oral Daily  . Gerhardt's butt cream   Topical BID  . insulin aspart  0-6 Units Subcutaneous Q4H  . lipase/protease/amylase  36,000 Units Oral TID AC  . magic mouthwash w/lidocaine  5 mL Oral TID  . mirtazapine  15 mg Oral QHS  . multivitamin with minerals  1 tablet Oral Daily  . mycophenolate  1,000 mg Oral BID  . Nintedanib  100 mg Oral BID  . potassium & sodium phosphates  1 packet Oral Q8H  . predniSONE  5 mg Oral Q breakfast   Continuous Infusions: . feeding supplement (OSMOLITE 1.2 CAL) 1,000 mL (11/03/20 1057)  . magnesium sulfate bolus IVPB      LOS: 11 days   Kerney Elbe, DO [facility] PAGER is on [place]  If 7PM-7AM, please contact night-coverage [email or web address]

## 2020-11-03 NOTE — Progress Notes (Signed)
Nutrition Follow-up  DOCUMENTATION CODES:   Severe malnutrition in context of chronic illness,Underweight  INTERVENTION:   -Advance Osmolite 1.2 at 20 ml/hr, by 10 ml every 6 hours to goal rate of 50 ml/hr. -100 ml free water QID. -At goal rate, this regimen will provide 1440 kcal, 66 grams protein, 1387 ml free water.   -30 ml Prosource Plus once/day PO or via tube -Boost Breeze po TID, each supplement provides 250 kcal and 9 grams of protein  If tolerates at goal can transition to bolus feeds: -237 ml Osmolite 1.2 five times daily -Free water flushes of 200 ml BID via G-tube -Provides 1425 kcals, 66g protein and 1375 ml H2O.  NUTRITION DIAGNOSIS:   Severe Malnutrition related to chronic illness as evidenced by severe fat depletion,severe muscle depletion,percent weight loss.  Ongoing.  GOAL:   Patient will meet greater than or equal to 90% of their needs  Progressing.  MONITOR:   Diet advancement,TF tolerance,Labs,Weight trends  REASON FOR ASSESSMENT:   Consult Enteral/tube feeding initiation and management,Calorie Count  ASSESSMENT:   Pt admitted with severe sepsis 2/2 abdominal wall cellulitis. PMH includes UIP on 2L O2, chronic prednisone use, RA, h/o DVT/PE, CAD, and SBO s/p ex lap Feb 2022 requiring TPN and G tube placement (has since been removed).  Intakes for 4/25: B: 550 kcals, 27g protein L: 230 kcals, 12g protein D: 205 kcals, 8g protein Supp: 1 Prosource =100 kcals, 15g protein Total: 1085 kcals, 62g protein   RD received consult to advance TF. Osmolite 1.2 @ 20 ml/hr. Now able to advance by 10 ml every 6 hours to goal rate of 50 ml/hr . If tolerates TF at goal, have left bolus tube feed recommendations. Per MD note, plan is for CIR this week once bed available.  Medications: Creon, Remeron, Magic Mouthwash, Multivitamin with minerals daily, Phos-NAK, IV Mg sulfate  Labs reviewed: CBGs: 116-126 Low Na, Phos  Diet Order:   Diet Order             DIET - DYS 1 Room service appropriate? Yes; Fluid consistency: Thin  Diet effective now                 EDUCATION NEEDS:   Not appropriate for education at this time  Skin:  Skin Assessment: Skin Integrity Issues: Skin Integrity Issues:: Stage II,Incisions Stage II: sacrum Incisions: abdomen (4/16 and 4/18)  Last BM:  4/19 (type 6 x1)  Height:   Ht Readings from Last 1 Encounters:  10/24/20 5' 4.5" (1.638 m)    Weight:   Wt Readings from Last 1 Encounters:  11/03/20 42.7 kg   BMI:  Body mass index is 15.91 kg/m.  Estimated Nutritional Needs:   Kcal:  1400-1600  Protein:  70-80 grams  Fluid:  >1.4L  Clayton Bibles, MS, RD, LDN Inpatient Clinical Dietitian Contact information available via Amion

## 2020-11-03 NOTE — Progress Notes (Signed)
Inpatient Rehab Admissions Coordinator:   I do not have a CIR bed for this Pt. Today. I will follow for potential admit later this week pending bed availability.   Clemens Catholic, [place], [place] Admissions Coordinator  [phone] ([place]) [phone] (office)

## 2020-11-03 NOTE — Progress Notes (Signed)
Attempted to give patient 0800 and 1000 AM meds. Patient stated that she didn't want them right now and that she would take them after lunch. I will try again around 1300.

## 2020-11-04 ENCOUNTER — Inpatient Hospital Stay (HOSPITAL_COMMUNITY)
Admission: RE | Admit: 2020-11-04 | Discharge: 2020-11-23 | DRG: 939 | Disposition: A | Discharge status: Skilled Nursing Facility | Payer: Medicare Other | Source: Intra-hospital | Attending: Physical Medicine and Rehabilitation | Admitting: Physical Medicine and Rehabilitation

## 2020-11-04 ENCOUNTER — Encounter (HOSPITAL_COMMUNITY): Payer: Self-pay | Admitting: Physical Medicine and Rehabilitation

## 2020-11-04 ENCOUNTER — Other Ambulatory Visit: Payer: Self-pay

## 2020-11-04 DIAGNOSIS — L89153 Pressure ulcer of sacral region, stage 3: Secondary | ICD-10-CM | POA: Diagnosis present

## 2020-11-04 DIAGNOSIS — I959 Hypotension, unspecified: Secondary | ICD-10-CM | POA: Diagnosis not present

## 2020-11-04 DIAGNOSIS — K9423 Gastrostomy malfunction: Secondary | ICD-10-CM | POA: Diagnosis not present

## 2020-11-04 DIAGNOSIS — M069 Rheumatoid arthritis, unspecified: Secondary | ICD-10-CM | POA: Diagnosis present

## 2020-11-04 DIAGNOSIS — T8189XA Other complications of procedures, not elsewhere classified, initial encounter: Secondary | ICD-10-CM | POA: Diagnosis not present

## 2020-11-04 DIAGNOSIS — Z86711 Personal history of pulmonary embolism: Secondary | ICD-10-CM

## 2020-11-04 DIAGNOSIS — Z20822 Contact with and (suspected) exposure to covid-19: Secondary | ICD-10-CM | POA: Diagnosis present

## 2020-11-04 DIAGNOSIS — R279 Unspecified lack of coordination: Secondary | ICD-10-CM | POA: Diagnosis not present

## 2020-11-04 DIAGNOSIS — Z8249 Family history of ischemic heart disease and other diseases of the circulatory system: Secondary | ICD-10-CM

## 2020-11-04 DIAGNOSIS — R1310 Dysphagia, unspecified: Secondary | ICD-10-CM | POA: Diagnosis present

## 2020-11-04 DIAGNOSIS — G629 Polyneuropathy, unspecified: Secondary | ICD-10-CM | POA: Diagnosis present

## 2020-11-04 DIAGNOSIS — Z931 Gastrostomy status: Secondary | ICD-10-CM

## 2020-11-04 DIAGNOSIS — Z7952 Long term (current) use of systemic steroids: Secondary | ICD-10-CM

## 2020-11-04 DIAGNOSIS — Z86718 Personal history of other venous thrombosis and embolism: Secondary | ICD-10-CM | POA: Diagnosis not present

## 2020-11-04 DIAGNOSIS — L8990 Pressure ulcer of unspecified site, unspecified stage: Secondary | ICD-10-CM | POA: Diagnosis present

## 2020-11-04 DIAGNOSIS — K1230 Oral mucositis (ulcerative), unspecified: Secondary | ICD-10-CM | POA: Diagnosis present

## 2020-11-04 DIAGNOSIS — L89152 Pressure ulcer of sacral region, stage 2: Secondary | ICD-10-CM | POA: Diagnosis not present

## 2020-11-04 DIAGNOSIS — N3000 Acute cystitis without hematuria: Secondary | ICD-10-CM | POA: Diagnosis not present

## 2020-11-04 DIAGNOSIS — Z9981 Dependence on supplemental oxygen: Secondary | ICD-10-CM

## 2020-11-04 DIAGNOSIS — R269 Unspecified abnormalities of gait and mobility: Secondary | ICD-10-CM | POA: Diagnosis present

## 2020-11-04 DIAGNOSIS — M21371 Foot drop, right foot: Secondary | ICD-10-CM | POA: Diagnosis present

## 2020-11-04 DIAGNOSIS — R64 Cachexia: Secondary | ICD-10-CM | POA: Diagnosis present

## 2020-11-04 DIAGNOSIS — I2510 Atherosclerotic heart disease of native coronary artery without angina pectoris: Secondary | ICD-10-CM | POA: Diagnosis present

## 2020-11-04 DIAGNOSIS — Z880 Allergy status to penicillin: Secondary | ICD-10-CM

## 2020-11-04 DIAGNOSIS — Z681 Body mass index (BMI) 19 or less, adult: Secondary | ICD-10-CM | POA: Diagnosis not present

## 2020-11-04 DIAGNOSIS — Z79899 Other long term (current) drug therapy: Secondary | ICD-10-CM

## 2020-11-04 DIAGNOSIS — R0902 Hypoxemia: Secondary | ICD-10-CM | POA: Diagnosis not present

## 2020-11-04 DIAGNOSIS — E43 Unspecified severe protein-calorie malnutrition: Secondary | ICD-10-CM | POA: Diagnosis present

## 2020-11-04 DIAGNOSIS — E739 Lactose intolerance, unspecified: Secondary | ICD-10-CM | POA: Diagnosis present

## 2020-11-04 DIAGNOSIS — R5381 Other malaise: Principal | ICD-10-CM | POA: Diagnosis present

## 2020-11-04 DIAGNOSIS — Z820 Family history of epilepsy and other diseases of the nervous system: Secondary | ICD-10-CM | POA: Diagnosis not present

## 2020-11-04 DIAGNOSIS — K5900 Constipation, unspecified: Secondary | ICD-10-CM | POA: Diagnosis not present

## 2020-11-04 DIAGNOSIS — Z515 Encounter for palliative care: Secondary | ICD-10-CM | POA: Diagnosis not present

## 2020-11-04 DIAGNOSIS — R001 Bradycardia, unspecified: Secondary | ICD-10-CM | POA: Diagnosis not present

## 2020-11-04 DIAGNOSIS — M6281 Muscle weakness (generalized): Secondary | ICD-10-CM | POA: Diagnosis not present

## 2020-11-04 DIAGNOSIS — Z431 Encounter for attention to gastrostomy: Secondary | ICD-10-CM | POA: Diagnosis not present

## 2020-11-04 DIAGNOSIS — L89322 Pressure ulcer of left buttock, stage 2: Secondary | ICD-10-CM | POA: Diagnosis present

## 2020-11-04 DIAGNOSIS — M21372 Foot drop, left foot: Secondary | ICD-10-CM | POA: Diagnosis present

## 2020-11-04 DIAGNOSIS — Z96653 Presence of artificial knee joint, bilateral: Secondary | ICD-10-CM | POA: Diagnosis present

## 2020-11-04 DIAGNOSIS — L03311 Cellulitis of abdominal wall: Secondary | ICD-10-CM | POA: Diagnosis present

## 2020-11-04 DIAGNOSIS — J189 Pneumonia, unspecified organism: Secondary | ICD-10-CM | POA: Diagnosis not present

## 2020-11-04 DIAGNOSIS — Z90710 Acquired absence of both cervix and uterus: Secondary | ICD-10-CM | POA: Diagnosis not present

## 2020-11-04 DIAGNOSIS — L89131 Pressure ulcer of right lower back, stage 1: Secondary | ICD-10-CM | POA: Diagnosis present

## 2020-11-04 DIAGNOSIS — B370 Candidal stomatitis: Secondary | ICD-10-CM

## 2020-11-04 DIAGNOSIS — R1312 Dysphagia, oropharyngeal phase: Secondary | ICD-10-CM | POA: Diagnosis not present

## 2020-11-04 DIAGNOSIS — Z4682 Encounter for fitting and adjustment of non-vascular catheter: Secondary | ICD-10-CM | POA: Diagnosis not present

## 2020-11-04 DIAGNOSIS — Z888 Allergy status to other drugs, medicaments and biological substances status: Secondary | ICD-10-CM

## 2020-11-04 DIAGNOSIS — R41841 Cognitive communication deficit: Secondary | ICD-10-CM | POA: Diagnosis not present

## 2020-11-04 DIAGNOSIS — R278 Other lack of coordination: Secondary | ICD-10-CM | POA: Diagnosis not present

## 2020-11-04 DIAGNOSIS — R197 Diarrhea, unspecified: Secondary | ICD-10-CM | POA: Diagnosis present

## 2020-11-04 DIAGNOSIS — J9611 Chronic respiratory failure with hypoxia: Secondary | ICD-10-CM | POA: Diagnosis present

## 2020-11-04 DIAGNOSIS — K3189 Other diseases of stomach and duodenum: Secondary | ICD-10-CM | POA: Diagnosis not present

## 2020-11-04 DIAGNOSIS — R2681 Unsteadiness on feet: Secondary | ICD-10-CM | POA: Diagnosis not present

## 2020-11-04 DIAGNOSIS — Z87891 Personal history of nicotine dependence: Secondary | ICD-10-CM

## 2020-11-04 DIAGNOSIS — K56699 Other intestinal obstruction unspecified as to partial versus complete obstruction: Secondary | ICD-10-CM | POA: Diagnosis not present

## 2020-11-04 DIAGNOSIS — R739 Hyperglycemia, unspecified: Secondary | ICD-10-CM | POA: Diagnosis not present

## 2020-11-04 DIAGNOSIS — J9600 Acute respiratory failure, unspecified whether with hypoxia or hypercapnia: Secondary | ICD-10-CM | POA: Diagnosis not present

## 2020-11-04 DIAGNOSIS — J9621 Acute and chronic respiratory failure with hypoxia: Secondary | ICD-10-CM | POA: Diagnosis not present

## 2020-11-04 DIAGNOSIS — Z7189 Other specified counseling: Secondary | ICD-10-CM | POA: Diagnosis not present

## 2020-11-04 DIAGNOSIS — J849 Interstitial pulmonary disease, unspecified: Secondary | ICD-10-CM | POA: Diagnosis not present

## 2020-11-04 DIAGNOSIS — J8410 Pulmonary fibrosis, unspecified: Secondary | ICD-10-CM | POA: Diagnosis not present

## 2020-11-04 DIAGNOSIS — K9429 Other complications of gastrostomy: Secondary | ICD-10-CM | POA: Diagnosis not present

## 2020-11-04 DIAGNOSIS — E611 Iron deficiency: Secondary | ICD-10-CM | POA: Diagnosis present

## 2020-11-04 LAB — CBC WITH DIFFERENTIAL/PLATELET
Abs Immature Granulocytes: 0.08 10*3/uL — ABNORMAL HIGH (ref 0.00–0.07)
Basophils Absolute: 0.1 10*3/uL (ref 0.0–0.1)
Basophils Relative: 1 %
Eosinophils Absolute: 0 10*3/uL (ref 0.0–0.5)
Eosinophils Relative: 0 %
HCT: 32.3 % — ABNORMAL LOW (ref 36.0–46.0)
Hemoglobin: 10.3 g/dL — ABNORMAL LOW (ref 12.0–15.0)
Immature Granulocytes: 1 %
Lymphocytes Relative: 45 %
Lymphs Abs: 5 10*3/uL — ABNORMAL HIGH (ref 0.7–4.0)
MCH: 31.8 pg (ref 26.0–34.0)
MCHC: 31.9 g/dL (ref 30.0–36.0)
MCV: 99.7 fL (ref 80.0–100.0)
Monocytes Absolute: 1.1 10*3/uL — ABNORMAL HIGH (ref 0.1–1.0)
Monocytes Relative: 10 %
Neutro Abs: 4.7 10*3/uL (ref 1.7–7.7)
Neutrophils Relative %: 43 %
Platelets: 254 10*3/uL (ref 150–400)
RBC: 3.24 MIL/uL — ABNORMAL LOW (ref 3.87–5.11)
RDW: 20.5 % — ABNORMAL HIGH (ref 11.5–15.5)
WBC: 10.8 10*3/uL — ABNORMAL HIGH (ref 4.0–10.5)
nRBC: 0 % (ref 0.0–0.2)

## 2020-11-04 LAB — CBC
HCT: 34.9 % — ABNORMAL LOW (ref 36.0–46.0)
Hemoglobin: 11.1 g/dL — ABNORMAL LOW (ref 12.0–15.0)
MCH: 31 pg (ref 26.0–34.0)
MCHC: 31.8 g/dL (ref 30.0–36.0)
MCV: 97.5 fL (ref 80.0–100.0)
Platelets: 288 10*3/uL (ref 150–400)
RBC: 3.58 MIL/uL — ABNORMAL LOW (ref 3.87–5.11)
RDW: 20.3 % — ABNORMAL HIGH (ref 11.5–15.5)
WBC: 10.4 10*3/uL (ref 4.0–10.5)
nRBC: 0 % (ref 0.0–0.2)

## 2020-11-04 LAB — COMPREHENSIVE METABOLIC PANEL
ALT: 26 U/L (ref 0–44)
AST: 34 U/L (ref 15–41)
Albumin: 2.2 g/dL — ABNORMAL LOW (ref 3.5–5.0)
Alkaline Phosphatase: 73 U/L (ref 38–126)
Anion gap: 8 (ref 5–15)
BUN: 12 mg/dL (ref 8–23)
CO2: 27 mmol/L (ref 22–32)
Calcium: 8.4 mg/dL — ABNORMAL LOW (ref 8.9–10.3)
Chloride: 98 mmol/L (ref 98–111)
Creatinine, Ser: 0.5 mg/dL (ref 0.44–1.00)
GFR, Estimated: >60 mL/min (ref >60)
Glucose, Bld: 99 mg/dL (ref 70–99)
Potassium: 4 mmol/L (ref 3.5–5.1)
Sodium: 133 mmol/L — ABNORMAL LOW (ref 135–145)
Total Bilirubin: 0.3 mg/dL (ref 0.3–1.2)
Total Protein: 5.6 g/dL — ABNORMAL LOW (ref 6.5–8.1)

## 2020-11-04 LAB — GLUCOSE, CAPILLARY
Glucose-Capillary: 110 mg/dL — ABNORMAL HIGH (ref 70–99)
Glucose-Capillary: 120 mg/dL — ABNORMAL HIGH (ref 70–99)
Glucose-Capillary: 127 mg/dL — ABNORMAL HIGH (ref 70–99)
Glucose-Capillary: 134 mg/dL — ABNORMAL HIGH (ref 70–99)
Glucose-Capillary: 93 mg/dL (ref 70–99)

## 2020-11-04 LAB — MAGNESIUM: Magnesium: 1.8 mg/dL (ref 1.7–2.4)

## 2020-11-04 LAB — CREATININE, SERUM
Creatinine, Ser: 0.52 mg/dL (ref 0.44–1.00)
GFR, Estimated: >60 mL/min (ref >60)

## 2020-11-04 LAB — PHOSPHORUS: Phosphorus: 2.7 mg/dL (ref 2.5–4.6)

## 2020-11-04 MED ORDER — OXYCODONE HCL 5 MG PO TABS: 5.0000 mg | ORAL_TABLET | Freq: Three times a day (TID) | ORAL | 0 refills | Status: DC | PRN

## 2020-11-04 MED ORDER — ACETAMINOPHEN 325 MG PO TABS: 650.0000 mg | ORAL_TABLET | Freq: Four times a day (QID) | ORAL | Status: DC | PRN

## 2020-11-04 MED ORDER — MAGIC MOUTHWASH W/LIDOCAINE
5.0000 mL | Freq: Three times a day (TID) | ORAL | Status: DC
  Administered 2020-11-04 – 2020-11-13 (×27): 5 mL via ORAL
  Filled 2020-11-04 (×27): qty 5

## 2020-11-04 MED ORDER — BENZOCAINE 10 % MT GEL
Freq: Four times a day (QID) | OROMUCOSAL | Status: DC | PRN
  Filled 2020-11-04: qty 9

## 2020-11-04 MED ORDER — NINTEDANIB ESYLATE 100 MG PO CAPS
100.0000 mg | ORAL_CAPSULE | Freq: Two times a day (BID) | ORAL | Status: DC
  Administered 2020-11-04 – 2020-11-23 (×37): 100 mg via ORAL
  Filled 2020-11-04 (×37): qty 1

## 2020-11-04 MED ORDER — ADULT MULTIVITAMIN W/MINERALS CH: 1.0000 | ORAL_TABLET | Freq: Every day | ORAL | Status: AC

## 2020-11-04 MED ORDER — BOOST / RESOURCE BREEZE PO LIQD CUSTOM
1.0000 | Freq: Three times a day (TID) | ORAL | Status: DC
  Administered 2020-11-04 (×2): 1 via ORAL

## 2020-11-04 MED ORDER — ACETAMINOPHEN 650 MG RE SUPP: 650.0000 mg | Freq: Four times a day (QID) | RECTAL | Status: DC | PRN

## 2020-11-04 MED ORDER — INSULIN ASPART 100 UNIT/ML ~~LOC~~ SOLN
0.0000 [IU] | SUBCUTANEOUS | Status: DC
  Administered 2020-11-05 – 2020-11-15 (×2): 1 [IU] via SUBCUTANEOUS

## 2020-11-04 MED ORDER — PANCRELIPASE (LIP-PROT-AMYL) 36000-114000 UNITS PO CPEP
36000.0000 [IU] | ORAL_CAPSULE | Freq: Three times a day (TID) | ORAL | Status: DC
  Administered 2020-11-04 – 2020-11-23 (×53): 36000 [IU] via ORAL
  Filled 2020-11-04 (×57): qty 1

## 2020-11-04 MED ORDER — MIRTAZAPINE 15 MG PO TBDP: 15.0000 mg | ORAL_TABLET | Freq: Every day | ORAL | Status: DC

## 2020-11-04 MED ORDER — ENOXAPARIN SODIUM 30 MG/0.3ML ~~LOC~~ SOLN
30.0000 mg | Freq: Every day | SUBCUTANEOUS | Status: DC
  Administered 2020-11-05 – 2020-11-06 (×2): 30 mg via SUBCUTANEOUS
  Filled 2020-11-04 (×2): qty 0.3

## 2020-11-04 MED ORDER — MIRTAZAPINE 15 MG PO TBDP
15.0000 mg | ORAL_TABLET | Freq: Every day | ORAL | Status: DC
  Administered 2020-11-04 – 2020-11-08 (×5): 15 mg via ORAL
  Filled 2020-11-04 (×6): qty 1

## 2020-11-04 MED ORDER — OSMOLITE 1.2 CAL PO LIQD: 50.0000 mL/h | ORAL | 0 refills | Status: DC

## 2020-11-04 MED ORDER — ONDANSETRON HCL 4 MG/2ML IJ SOLN: 4.0000 mg | Freq: Four times a day (QID) | INTRAMUSCULAR | Status: DC | PRN

## 2020-11-04 MED ORDER — ONDANSETRON HCL 4 MG PO TABS
4.0000 mg | ORAL_TABLET | Freq: Four times a day (QID) | ORAL | Status: DC | PRN
Start: 2020-11-04 — End: 2020-11-23
  Administered 2020-11-09: 4 mg via ORAL
  Filled 2020-11-04: qty 1

## 2020-11-04 MED ORDER — ADULT MULTIVITAMIN W/MINERALS CH
1.0000 | ORAL_TABLET | Freq: Every day | ORAL | Status: DC
  Administered 2020-11-06 – 2020-11-08 (×3): 1 via ORAL
  Filled 2020-11-04 (×4): qty 1

## 2020-11-04 MED ORDER — MYCOPHENOLATE MOFETIL 250 MG PO CAPS
1000.0000 mg | ORAL_CAPSULE | Freq: Two times a day (BID) | ORAL | Status: DC
  Administered 2020-11-04: 1000 mg via ORAL
  Filled 2020-11-04 (×2): qty 4

## 2020-11-04 MED ORDER — OSMOLITE 1.2 CAL PO LIQD
1000.0000 mL | ORAL | Status: DC
  Administered 2020-11-04: 1000 mL
  Filled 2020-11-04: qty 1000

## 2020-11-04 MED ORDER — FAMOTIDINE 40 MG/5ML PO SUSR
20.0000 mg | Freq: Every day | ORAL | Status: DC
  Administered 2020-11-05 – 2020-11-14 (×10): 20 mg
  Filled 2020-11-04 (×10): qty 2.5

## 2020-11-04 MED ORDER — FLUCONAZOLE 100 MG PO TABS
100.0000 mg | ORAL_TABLET | Freq: Every day | ORAL | Status: AC
  Administered 2020-11-05 – 2020-11-08 (×4): 100 mg via ORAL
  Filled 2020-11-04 (×4): qty 1

## 2020-11-04 MED ORDER — CHLORHEXIDINE GLUCONATE 0.12 % MT SOLN
15.0000 mL | Freq: Four times a day (QID) | OROMUCOSAL | Status: DC
  Administered 2020-11-04 – 2020-11-23 (×67): 15 mL via OROMUCOSAL
  Filled 2020-11-04 (×67): qty 15

## 2020-11-04 MED ORDER — FLUTICASONE PROPIONATE 50 MCG/ACT NA SUSP
2.0000 | Freq: Every day | NASAL | Status: DC | PRN
  Administered 2020-11-08: 2 via NASAL
  Filled 2020-11-04: qty 16

## 2020-11-04 MED ORDER — ENOXAPARIN SODIUM 30 MG/0.3ML ~~LOC~~ SOLN: 30.0000 mg | SUBCUTANEOUS | Status: DC

## 2020-11-04 MED ORDER — PREDNISONE 5 MG PO TABS
5.0000 mg | ORAL_TABLET | Freq: Every day | ORAL | Status: DC
  Administered 2020-11-05 – 2020-11-23 (×19): 5 mg via ORAL
  Filled 2020-11-04 (×20): qty 1

## 2020-11-04 MED ORDER — PROSOURCE PLUS PO LIQD
30.0000 mL | Freq: Every day | ORAL | Status: DC
  Administered 2020-11-05: 30 mL via ORAL
  Filled 2020-11-04: qty 30

## 2020-11-04 MED ORDER — ONDANSETRON HCL 4 MG PO TABS: 4.0000 mg | ORAL_TABLET | Freq: Three times a day (TID) | ORAL | 0 refills | Status: DC | PRN

## 2020-11-04 MED ORDER — ACETAMINOPHEN 325 MG PO TABS
650.0000 mg | ORAL_TABLET | Freq: Four times a day (QID) | ORAL | Status: DC | PRN
  Administered 2020-11-05 – 2020-11-19 (×7): 650 mg via ORAL
  Filled 2020-11-04 (×9): qty 2

## 2020-11-04 NOTE — Progress Notes (Signed)
Report given to receiving nurse. Wound care will be provided to patient. Medication is to go with the patient to new facility. Will continue to monitor until pick up.

## 2020-11-04 NOTE — Progress Notes (Signed)
Inpatient Rehab Admissions Coordinator:   I have a bed for this patient and am planning to admit her to CIR this morning. [facility] to pick patient up for transfer at 12pm  to [facility], room 4w21. RN may call report to [phone]. Please contact me with any questions.   Clemens Catholic, [place], [place] Admissions Coordinator  [phone] ([place]) [phone] (office)

## 2020-11-04 NOTE — H&P (Signed)
Physical Medicine and Rehabilitation Admission H&P    Chief Complaint  Patient presents with  . Wound Infection  : HPI: [name] [name] is an 87 year old right-handed female with history of chronic hypoxic respiratory failure /interstitial pneumonia/pulmonary fibrosis on 2 L oxygen, chronic prednisone/CellCept for rheumatoid arthritis, history of DVT/PE, SBO status post exploratory laparotomy February 2022 with G-tube placement 08/23/2020 that is since been removed.  History taken from chart review and patient. Per chart review patient lives alone.  1 level home 2 steps to entry.  Patient reportedly independent until 2 months ago she now has an aide a few hours a day 2 days a week.  She has had a couple of falls in the last couple of weeks.  She presented on 10/23/2020 with increasing abdominal pain and some wound drainage from prior G-tube and some AMS as well as right foot drop.  She was admitted a few weeks ago in February with complications from her exploratory laparotomy requiring TPN and was discharged to a skilled nursing facility for short time.  Admission chemistry sodium 146, potassium 5/3, WBC 11,400, hemoglobin 11.9, lactic acid 2.7-3.4, blood cultures no growth to date, COVID-negative.  CT of abdomen pelvis showed no acute process in the abdomen or pelvis.  Cranial CT scan unremarkable for acute intracranial process. General surgery consulted initially with wound VAC later discontinued changed to wet-to-dry dressings..  Palliative care consulted for goals of care.  Due to patient's poor p.o. intake interventional radiology consulted as well as ongoing significant dysphagia, G-tube was placed 10/29/2020 per interventional radiology.  Patient did have some thrush and mouth pain CT maxillofacial showed fairly diffuse mucosal enhancement within the oral cavity likely reflecting sequela of reported thrush/mucositis.  Dr.Owsley consulted and felt her lower lip was sinking into her lower implants  causing some ulceration of both sides of her inner lip and maintained on chlorhexidine.  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 Maintained on Lovenox for DVT prophylaxis.  Therapy evaluations completed due to patient's debility was admitted for a comprehensive rehab program. Please see preadmission assessment from earlier today as well.   Review of Systems  Constitutional: Negative for chills and fever.  HENT: Negative for hearing loss.   Eyes: Negative for blurred vision and double vision.  Respiratory: Negative for cough and shortness of breath.   Cardiovascular: Negative for chest pain and palpitations.  Gastrointestinal: Positive for abdominal pain and constipation. Negative for heartburn and nausea.  Genitourinary: Negative for dysuria, flank pain and hematuria.  Musculoskeletal: Positive for joint pain and myalgias.  Skin: Negative for rash.  Neurological: Positive for weakness.  Psychiatric/Behavioral: The patient has insomnia.   All other systems reviewed and are negative.  Past Medical History:  Diagnosis Date  . Allergic rhinitis   . Allergy   . CAD (coronary artery disease)    Mild CAD by cath 2008  . CHF (congestive heart failure) ([place])   . DJD (degenerative joint disease)    rheumatoid  . DVT (deep venous thrombosis) ([place])   . Dyslipidemia   . History of echocardiogram    Echo 12/2018: EF 60-65, mod asymmetric LVH, Gr 1 DD  . History of nuclear stress test    Myoview 5/17:  EF 74%, normal perfusion, low risk // Myoview 12/2018:  EF 89, very mild ischemia in inferoapical wall; Low Risk  . History of pulmonary embolism   . Hyperlipidemia   . Insomnia   . Osteoporosis   .  Psoriasis   . Pulmonary fibrosis ([place])   . Rheumatoid arthritis [facility])    Past Surgical History:  Procedure Laterality Date  . ABDOMINAL HYSTERECTOMY  1974  . APPENDECTOMY  1959  . CATARACT EXTRACTION    . IR REPLC GASTRO/COLONIC TUBE PERCUT W/FLUORO  10/29/2020   . LAPAROTOMY N/A 08/23/2020   Procedure: EXPLORATORY LAPAROTOMY small bowel resection gastrostomy tube placement;  Surgeon: Dwan Bolt, MD;  Location: [facility]S;  Service: General;  Laterality: N/A;  . LEFT HEART CATHETERIZATION WITH CORONARY ANGIOGRAM N/A 06/18/2013   Procedure: LEFT HEART CATHETERIZATION WITH CORONARY ANGIOGRAM;  Surgeon: Peter M Martinique, MD;  Location: [facility] CATH LAB;  Service: Cardiovascular;  Laterality: N/A;  . TONSILLECTOMY  1941, 1951  . TOTAL KNEE ARTHROPLASTY  1997, 2007   Bilateral   Family History  Problem Relation Age of Onset  . Kidney disease Daughter 5  . Cancer Daughter   . Heart attack Father 29  . Alzheimer's disease Sister   . Heart disease Sister   . Alzheimer's disease Sister    Social History:  reports that she quit smoking about 47 years ago. Her smoking use included cigarettes. She has a 10.00 pack-year smoking history. She has never used smokeless tobacco. She reports that she does not drink alcohol and does not use drugs. Allergies:  Allergies  Allergen Reactions  . Crestor [Rosuvastatin] Other (See Comments)    Muscle pain  . Lactose Intolerance (Gi) Other (See Comments)    Stomach upset  . Penicillins Hives  . Imuran [Azathioprine] Nausea And Vomiting   Medications Prior to Admission  Medication Sig Dispense Refill  . acetaminophen (TYLENOL) 160 MG/5ML solution Take 160 mg by mouth every 6 (six) hours as needed for moderate pain.    . [EXPIRED] cephALEXin (KEFLEX) 500 MG capsule Take 1 capsule (500 mg total) by mouth 2 (two) times daily for 7 days. (Patient taking differently: Take 500 mg by mouth 2 (two) times daily. Start date : 10/20/20) 14 capsule 0  . Ensure Plus (ENSURE PLUS) LIQD Take 237 mLs by mouth daily as needed (nutritional supplement).    . fluconazole (DIFLUCAN) 150 MG tablet Take 1 tablet (150 mg total) by mouth every 3 (three) days. 2 tablet 0  . [EXPIRED] HYDROcodone-acetaminophen (NORCO) 10-325 MG tablet Take 1-2 tablets  by mouth 2 (two) times daily as needed for up to 5 days. 20 tablet 0  . InFLIXimab (REMICADE IV) Inject 1 Dose into the vein as directed. Every 2 months    . lipase/protease/amylase (CREON) 36000 UNITS CPEP capsule Take 1 capsule (36,000 Units total) by mouth 3 (three) times daily before meals. 180 capsule 11  . mupirocin ointment (BACTROBAN) 2 % Apply 1 application topically 2 (two) times daily. 22 g 0  . mycophenolate (CELLCEPT) 500 MG tablet 2 tabs bid (Patient taking differently: Take 1,000 mg by mouth 2 (two) times daily.) 360 tablet 3  . Nintedanib (OFEV) 100 MG CAPS Take 1 capsule (100 mg total) by mouth 2 (two) times daily. 60 capsule 11  . nitroGLYCERIN (NITROSTAT) 0.4 MG SL tablet Place 0.4 mg under the tongue every 5 (five) minutes as needed for chest pain.    . potassium chloride (KLOR-CON) 10 MEQ tablet Take 20 Meq twice daily x 2 days and then 20 Meq daily (Patient taking differently: Take 10 mEq by mouth every other day.) 60 tablet 1  . predniSONE (DELTASONE) 5 MG tablet TAKE 1 TABLET DAILY WITH BREAKFAST (Patient taking differently: Take 5 mg  by mouth daily with breakfast.) 90 tablet 3  . Skin Protectants, Misc. (ILEX SKIN PROTECTANT EX) Apply 1 application topically 2 (two) times daily.    . famotidine (PEPCID) 20 MG tablet Take 1 tablet (20 mg total) by mouth daily. (Patient not taking: No sig reported)    . fluticasone (FLONASE) 50 MCG/ACT nasal spray Place 2 sprays into both nostrils daily as needed for allergies or rhinitis. (Patient not taking: No sig reported) 16 g 3  . furosemide (LASIX) 20 MG tablet Take 1 tablet by mouth daily as needed for swelling. (Patient not taking: No sig reported) 30 tablet 11  . nitroGLYCERIN (NITROSTAT) 0.4 MG SL tablet Place 1 tablet (0.4 mg total) under the tongue every 5 (five) minutes as needed for chest pain. (Patient not taking: Reported on 10/23/2020) 25 tablet 7  . Nutritional Supplements (,FEEDING SUPPLEMENT, PROSOURCE PLUS) liquid Take 30 mLs  by mouth 2 (two) times daily between meals. (Patient not taking: No sig reported)      Drug Regimen Review Drug regimen was reviewed and remains appropriate with no significant issues identified  Home: Home Living Family/patient expects to be discharged to:: Unsure Living Arrangements: Alone Available Help at Discharge: Family,Available 24 hours/day,Other (Comment) (family has a team of caregivers able to provide 24/7 care) Type of Home: House Home Access: Level entry Entrance Stairs-Number of Steps: 2 Home Layout: One level Bathroom Shower/Tub: Multimedia programmer: Handicapped height Bathroom Accessibility: Yes Home Equipment: [place] - single point,Walker - 2 wheels Additional Comments: Information from prior visit - pt unable to provide today   Functional History: Prior Function Level of Independence: Needs assistance Comments: Pt unable to provide PLOF.  From visit ~ 2 months ago she was independent with ambulation and had an aide a few hr/day 2 days/week.  Has had recent falls.  Did not in [facility] P pt d/c to SNF for 2 weeks after last admission then to ALF with 24 hr care.  Functional Status:  Mobility: Bed Mobility Overal bed mobility: Needs Assistance Bed Mobility: Rolling,Sidelying to Sit Rolling: Min guard Sidelying to sit: Min assist Supine to sit: Min assist,HOB elevated Sit to supine: Min assist General bed mobility comments: Cues needed to initaite rolling to Rt side, min assist to raise trunk upright and bring LE's off EOB fully. Transfers Overall transfer level: Needs assistance Equipment used: Rolling walker (2 wheeled) Transfers: Sit to/from [employer] Sit to Stand: Min assist Stand pivot transfers: Mod assist General transfer comment: Cues needed for safety with rising from EOB. min assist to steady with rise. Pt's Rt LE resting in abducted position and pt unable to bring it within BOS/walker frame. Mod assist for safety with  walker management and to guide direction of turn to sit in recliner. Pt fatgieud and greatly limited by pain with steps to chair. Ambulation/Gait Ambulation/Gait assistance: Mod assist Gait Distance (Feet): 3 Feet Assistive device: Rolling walker (2 wheeled) Gait Pattern/deviations: Step-to pattern,Decreased stride length,Decreased dorsiflexion - right,Shuffle,Trunk flexed General Gait Details: pt required Mod assist to complete small side steps from EOB to recliner. pt unsteady and with poor safety awareness for RW management. Pt's Rt LE abducted and pt unable to control position to bring Rt foot underhip for improved posture/alignment. Gait velocity: decr    ADL: ADL Overall ADL's : Needs assistance/impaired Eating/Feeding: Set up,Bed level Grooming: Wash/dry face,Bed level,Minimal assistance Grooming Details (indicate cue type and reason): Pt stated that she could not tolerate sitting EOB for grooming today  due to abdominal pain. Upper Body Bathing: Sitting,Maximal assistance Lower Body Bathing: Total assistance,Bed level (Applied lotion to dry appeared lower legs. Pt stated that the lotion was cold despite trying to warm up in hands and requested no further ADL activities.) Upper Body Dressing : Moderate assistance,Sitting Lower Body Dressing: Total assistance,Sit to/from stand Toilet Transfer: Moderate assistance,Stand-pivot,RW,BSC Toilet Transfer Details (indicate cue type and reason): unsteady, poor safety awareness Toileting- Clothing Manipulation and Hygiene: Supervision/safety,Set up,Sitting/lateral lean Toileting - Clothing Manipulation Details (indicate cue type and reason): for peri care after voiding, patient able to complete seated on commode Functional mobility during ADLs: Moderate assistance,Rolling walker,Cueing for safety,Cueing for sequencing  Cognition: Cognition Overall Cognitive Status: Within Functional Limits for tasks assessed Orientation Level: Oriented to  situation,Oriented to person Cognition Arousal/Alertness: Awake/alert Behavior During Therapy: Flat affect Overall Cognitive Status: Within Functional Limits for tasks assessed General Comments: Pt with decreased attention to task and education today due to pain. Pt closing eyes at times and turning away.  Physical Exam: Blood pressure (!) 87/55, pulse 78, temperature 97.8 F (36.6 C), temperature source Oral, resp. rate 15, height 5' 4.5" (1.638 m), weight 42.8 kg, SpO2 96 %. Physical Exam Vitals reviewed.  Constitutional:      General: She is not in acute distress.    Comments: Cachectic  HENT:     Head: Normocephalic and atraumatic.     Right Ear: External ear normal.     Left Ear: External ear normal.     Nose: Nose normal.  Eyes:     General:        Right eye: No discharge.        Left eye: No discharge.     Extraocular Movements: Extraocular movements intact.  Cardiovascular:     Rate and Rhythm: Normal rate and regular rhythm.  Pulmonary:     Effort: Pulmonary effort is normal. No respiratory distress.     Breath sounds: No stridor.  Abdominal:     General: Abdomen is flat. Bowel sounds are normal.     Comments: +PEG  Musculoskeletal:     Cervical back: Normal range of motion and neck supple.     Comments: No edema or tenderness in extremities  Skin:    Comments: Stage III sacral ulcer Ulcer on left upper posterior pelvis Abdominal open wound  Neurological:     Mental Status: She is alert.     Comments: Alert and oriented HOH Motor: B/l UE: 4+/5 proximal to distal RLE: 4/5 proximal to distal LLE: 4/5 HF, KE, 3-/5 ADF  Psychiatric:        Mood and Affect: Mood normal.        Behavior: Behavior normal.     Results for orders placed or performed during the hospital encounter of 10/23/20 (from the past 48 hour(s))  Glucose, capillary     Status: Abnormal   Collection Time: 11/02/20 11:30 AM  Result Value Ref Range   Glucose-Capillary 121 (H) 70 - 99 mg/dL     Comment: Glucose reference range applies only to samples taken after fasting for at least 8 hours.  Glucose, capillary     Status: Abnormal   Collection Time: 11/02/20  4:32 PM  Result Value Ref Range   Glucose-Capillary 298 (H) 70 - 99 mg/dL    Comment: Glucose reference range applies only to samples taken after fasting for at least 8 hours.  Glucose, capillary     Status: Abnormal   Collection Time: 11/02/20  8:21 PM  Result Value Ref Range   Glucose-Capillary 141 (H) 70 - 99 mg/dL    Comment: Glucose reference range applies only to samples taken after fasting for at least 8 hours.   Comment 1 Notify RN    Comment 2 Document in Chart   Glucose, capillary     Status: Abnormal   Collection Time: 11/02/20 11:21 PM  Result Value Ref Range   Glucose-Capillary 110 (H) 70 - 99 mg/dL    Comment: Glucose reference range applies only to samples taken after fasting for at least 8 hours.   Comment 1 Notify RN    Comment 2 Document in Chart   Glucose, capillary     Status: Abnormal   Collection Time: 11/03/20  4:06 AM  Result Value Ref Range   Glucose-Capillary 119 (H) 70 - 99 mg/dL    Comment: Glucose reference range applies only to samples taken after fasting for at least 8 hours.   Comment 1 Notify RN    Comment 2 Document in Chart   Comprehensive metabolic panel     Status: Abnormal   Collection Time: 11/03/20  5:21 AM  Result Value Ref Range   Sodium 130 (L) 135 - 145 mmol/L   Potassium 3.9 3.5 - 5.1 mmol/L   Chloride 99 98 - 111 mmol/L   CO2 25 22 - 32 mmol/L   Glucose, Bld 95 70 - 99 mg/dL    Comment: Glucose reference range applies only to samples taken after fasting for at least 8 hours.   BUN 9 8 - 23 mg/dL   Creatinine, Ser 0.44 0.44 - 1.00 mg/dL   Calcium 8.3 (L) 8.9 - 10.3 mg/dL   Total Protein 5.5 (L) 6.5 - 8.1 g/dL   Albumin 2.1 (L) 3.5 - 5.0 g/dL   AST 30 15 - 41 U/L   ALT 24 0 - 44 U/L   Alkaline Phosphatase 76 38 - 126 U/L   Total Bilirubin 0.5 0.3 - 1.2 mg/dL    GFR, Estimated >60 >60 mL/min    Comment: (NOTE) Calculated using the CKD-EPI Creatinine Equation (2021)    Anion gap 6 5 - 15    Comment: Performed at [facility], [place] [address] [place], [place] [address]  CBC with Differential/Platelet     Status: Abnormal   Collection Time: 11/03/20  5:21 AM  Result Value Ref Range   WBC 10.8 (H) 4.0 - 10.5 K/uL   RBC 3.35 (L) 3.87 - 5.11 MIL/uL   Hemoglobin 10.4 (L) 12.0 - 15.0 g/dL   HCT 32.8 (L) 36.0 - 46.0 %   MCV 97.9 80.0 - 100.0 fL   MCH 31.0 26.0 - 34.0 pg   MCHC 31.7 30.0 - 36.0 g/dL   RDW 20.1 (H) 11.5 - 15.5 %   Platelets 231 150 - 400 K/uL   nRBC 0.0 0.0 - 0.2 %   Neutrophils Relative % 47 %   Neutro Abs 5.3 1.7 - 7.7 K/uL   Lymphocytes Relative 40 %   Lymphs Abs 4.3 (H) 0.7 - 4.0 K/uL   Monocytes Relative 10 %   Monocytes Absolute 1.0 0.1 - 1.0 K/uL   Eosinophils Relative 1 %   Eosinophils Absolute 0.1 0.0 - 0.5 K/uL   Basophils Relative 1 %   Basophils Absolute 0.1 0.0 - 0.1 K/uL   Immature Granulocytes 1 %   Abs Immature Granulocytes 0.08 (H) 0.00 - 0.07 K/uL    Comment: Performed at [facility], [place]  [address] [place], [place] [address]  Phosphorus     Status: Abnormal   Collection Time: 11/03/20  5:21 AM  Result Value Ref Range   Phosphorus 2.1 (L) 2.5 - 4.6 mg/dL    Comment: Performed at [facility], [place] [address] [place], [place] [address]  Magnesium     Status: None   Collection Time: 11/03/20  5:21 AM  Result Value Ref Range   Magnesium 1.7 1.7 - 2.4 mg/dL    Comment: Performed at [facility], [place] [address] [place], [place] [address]  Vitamin B12     Status: None   Collection Time: 11/03/20  5:21 AM  Result Value Ref Range   Vitamin B-12 711 180 - 914 pg/mL    Comment: (NOTE) This assay is not validated for testing neonatal or myeloproliferative syndrome specimens for Vitamin B12 levels. Performed at [facility], [place] [address] [place], [place] [address]   Iron and TIBC     Status: Abnormal   Collection Time: 11/03/20  5:21 AM  Result Value Ref Range   Iron 25 (L) 28 - 170 ug/dL   TIBC 180 (L) 250 - 450 ug/dL   Saturation Ratios 14 10.4 - 31.8 %   UIBC 155 ug/dL    Comment: Performed at [facility], [place] [address], [place], [place] [address]  Ferritin     Status: Abnormal   Collection Time: 11/03/20  5:21 AM  Result Value Ref Range   Ferritin 970 (H) 11 - 307 ng/mL    Comment: Performed at [facility], [place] [address] [place], [place] [address]  Reticulocytes     Status: Abnormal   Collection Time: 11/03/20  5:21 AM  Result Value Ref Range   Retic Ct Pct 2.3 0.4 - 3.1 %   RBC. 3.32 (L) 3.87 - 5.11 MIL/uL   Retic Count, Absolute 75.0 19.0 - 186.0 K/uL   Immature Retic Fract 33.0 (H) 2.3 - 15.9 %    Comment: Performed at [facility], [place] [address] [place], [place] [address]  Folate     Status: None   Collection Time: 11/03/20  5:21 AM  Result Value Ref Range   Folate 9.7 >5.9 ng/mL    Comment: Performed at [facility], [place] [address], [place], [place] [address]  Glucose, capillary     Status: Abnormal   Collection Time: 11/03/20  7:28 AM  Result Value Ref Range   Glucose-Capillary 116 (H) 70 - 99 mg/dL    Comment: Glucose reference range applies only to samples taken after fasting for at least 8 hours.  Glucose, capillary     Status: Abnormal   Collection Time: 11/03/20 11:45 AM  Result Value Ref Range   Glucose-Capillary 126 (H) 70 - 99 mg/dL    Comment: Glucose reference range applies only to samples taken after fasting for at least 8 hours.  Glucose, capillary     Status: Abnormal   Collection Time: 11/03/20  4:29 PM  Result Value Ref Range   Glucose-Capillary 177 (H) 70 - 99 mg/dL    Comment: Glucose reference range applies only to samples taken after fasting for at least 8 hours.   Glucose, capillary     Status: Abnormal   Collection Time: 11/03/20  8:08 PM  Result Value Ref Range   Glucose-Capillary 130 (H) 70 - 99 mg/dL    Comment: Glucose reference range applies only to samples taken after fasting  for at least 8 hours.   Comment 1 Notify RN    Comment 2 Document in Chart   Glucose, capillary     Status: Abnormal   Collection Time: 11/03/20 11:28 PM  Result Value Ref Range   Glucose-Capillary 163 (H) 70 - 99 mg/dL    Comment: Glucose reference range applies only to samples taken after fasting for at least 8 hours.   Comment 1 Notify RN    Comment 2 Document in Chart   Glucose, capillary     Status: Abnormal   Collection Time: 11/04/20  3:54 AM  Result Value Ref Range   Glucose-Capillary 127 (H) 70 - 99 mg/dL    Comment: Glucose reference range applies only to samples taken after fasting for at least 8 hours.  CBC with Differential/Platelet     Status: Abnormal   Collection Time: 11/04/20  5:06 AM  Result Value Ref Range   WBC 10.8 (H) 4.0 - 10.5 K/uL   RBC 3.24 (L) 3.87 - 5.11 MIL/uL   Hemoglobin 10.3 (L) 12.0 - 15.0 g/dL   HCT 32.3 (L) 36.0 - 46.0 %   MCV 99.7 80.0 - 100.0 fL   MCH 31.8 26.0 - 34.0 pg   MCHC 31.9 30.0 - 36.0 g/dL   RDW 20.5 (H) 11.5 - 15.5 %   Platelets 254 150 - 400 K/uL   nRBC 0.0 0.0 - 0.2 %   Neutrophils Relative % 43 %   Neutro Abs 4.7 1.7 - 7.7 K/uL   Lymphocytes Relative 45 %   Lymphs Abs 5.0 (H) 0.7 - 4.0 K/uL   Monocytes Relative 10 %   Monocytes Absolute 1.1 (H) 0.1 - 1.0 K/uL   Eosinophils Relative 0 %   Eosinophils Absolute 0.0 0.0 - 0.5 K/uL   Basophils Relative 1 %   Basophils Absolute 0.1 0.0 - 0.1 K/uL   Immature Granulocytes 1 %   Abs Immature Granulocytes 0.08 (H) 0.00 - 0.07 K/uL    Comment: Performed at [facility], [place] [address] [place], [place] [address]  Comprehensive metabolic panel     Status: Abnormal   Collection Time: 11/04/20  5:06 AM  Result Value Ref Range   Sodium 133 (L)  135 - 145 mmol/L   Potassium 4.0 3.5 - 5.1 mmol/L   Chloride 98 98 - 111 mmol/L   CO2 27 22 - 32 mmol/L   Glucose, Bld 99 70 - 99 mg/dL    Comment: Glucose reference range applies only to samples taken after fasting for at least 8 hours.   BUN 12 8 - 23 mg/dL   Creatinine, Ser 0.50 0.44 - 1.00 mg/dL   Calcium 8.4 (L) 8.9 - 10.3 mg/dL   Total Protein 5.6 (L) 6.5 - 8.1 g/dL   Albumin 2.2 (L) 3.5 - 5.0 g/dL   AST 34 15 - 41 U/L   ALT 26 0 - 44 U/L   Alkaline Phosphatase 73 38 - 126 U/L   Total Bilirubin 0.3 0.3 - 1.2 mg/dL   GFR, Estimated >60 >60 mL/min    Comment: (NOTE) Calculated using the CKD-EPI Creatinine Equation (2021)    Anion gap 8 5 - 15    Comment: Performed at [facility], [place] [address] [place], [place] [address]  Phosphorus     Status: None   Collection Time: 11/04/20  5:06 AM  Result Value Ref Range   Phosphorus 2.7 2.5 - 4.6 mg/dL    Comment: Performed at [facility], [place] [address], [place], [place] [address]  Magnesium     Status: None   Collection Time: 11/04/20  5:06 AM  Result Value Ref Range   Magnesium 1.8 1.7 - 2.4 mg/dL    Comment: Performed at [facility], [place] [address], [place], [place] [address]  Glucose, capillary     Status: None   Collection Time: 11/04/20  7:55 AM  Result Value Ref Range   Glucose-Capillary 93 70 - 99 mg/dL    Comment: Glucose reference range applies only to samples taken after fasting for at least 8 hours.   No results found.     Medical Problem List and Plan: 1.  Debility secondary to abdominal wall cellulitis/sepsis complicated by chronic hypoxic respiratory failure/RA  -patient may not shower  -ELOS/Goals: 14-17 days/Min A  Admit to CIR 2.  Antithrombotics: -DVT/anticoagulation: Lovenox  -antiplatelet therapy: N/A 3. Pain Management: Tylenol as needed 4. Mood: Remeron 15 mg nightly  -antipsychotic agents: N/A 5. Neuropsych: This patient is capable of  making decisions on her own behalf. 6. Skin/Wound Care: Routine skin checks.  Foam dressing to umbilicus and change every 3 days as needed soiling.  Apply saline moistened 2 x 2 to abdomen wound daily cover with dry 2 x 2 and tape.  Apply dry split thickness gauze underneath G-tube daily. 7. Fluids/Electrolytes/Nutrition: Routine in and outs.  CMP ordered 8.  Rheumatoid arthritis.  Continue prednisone. 9.  Chronic hypoxic respiratory failure.  Oxygen therapy as directed continue chronic prednisone.  Monitor O2 sats with increased exertion 10.  Dysphagia.  Dysphagia #1 thin liquids.  G-tube replaced 10/29/2020 per interventional radiology.  Dietary follow-up  Advance diet as tolerated 11.  Thrush/mouth pain/odynophagia.  Follow Dr. Tandy Gaw, PA-C 11/04/2020  I have personally performed a face to face diagnostic evaluation, including, but not limited to relevant history and physical exam findings, of this patient and developed relevant assessment and plan.  Additionally, I have reviewed and concur with the physician assistant's documentation above.  Delice Lesch, MD, ABPMR  The patient's status has not changed. Any changes from the pre-admission screening or documentation from the acute chart are noted above.   Delice Lesch, MD, ABPMR

## 2020-11-04 NOTE — Progress Notes (Signed)
PMR Admission Coordinator Pre-Admission Assessment  Patient: [name] [name] is an 87 y.o., female MRN: [id number] DOB: 1930/11/23 Height: 5' 4.5" (163.8 cm) Weight: 42.8 kg  Insurance Information HMO:     PPO:      PCP:      IPA:      80/20: yes     OTHER:  PRIMARY: Medicare A &B      Policy#: [id number]      Subscriber: patient CM Name:       Phone#:      Fax#:  Pre-Cert#:       Employer:  Benefits:  Phone #: verified eligibility via [employer] on 11/01/20     Name:  Eff. Date: Part A effective 09/08/97, Part B effective 07/12/99     Deduct: $1,556      Out of Pocket Max: NA      Life Max: NA CIR: 100% with Medicare approval       SNF: 100% days 1-20, 80% days 21-100 Outpatient: 80%     Co-Pay: 20% Home Health: 100%      Co-Pay:  DME: 80%     Co-Pay: 20% Providers: pt's choice SECONDARY: Tricare for Life      Policy#: [id number]     Phone#: [phone]  Financial Counselor:       Phone#:   The "Data Collection Information Summary" for patients in Inpatient Rehabilitation Facilities with attached "Privacy Act [place] Records" was provided and verbally reviewed with: Patient and Family  Emergency Contact Information         Contact Information    Name Relation Home Work Mobile   [place] [phone]  [place], [place] Relative [phone]  [phone]   [name] [phone]  [phone]      Current Medical History  Patient Admitting Diagnosis: Abdominal wall cellulitis; debility  History of Present Illness: Pt is an 87 year old female.  Medical hx significant for: RA, mild CAD, h/o DVT and PE, UIP on 2L O2 via nasal cannula, osteoporosis, HTN.  Pt had SBO and is s/p exploratory lap (February 2022). Pt readmitted in March d/t complications after ex lap requiring TPN and G-tube placement which was ultimately removed.  Pt discharged to a SNF for less than 2 weeks and then returned home with 24 hour care.   Wound RN from [facility] was performing wound care.  Pt has had poor appetite and recently odynophagia, symptoms of fungal infection, and worsening generalized weakness.  Pt also presented to hospital April 8 d/t generalized abdominal pain. Pt presented to ED on 10/23/20 with worsening abdominal pain and wound drainage. Superficial culture was taken of wound and showed polymicrobial infection.  In the ED pt noted to be tachypneic and tachycardic and found to be septic. Pt treated with antibiotics and wound VAC applied to mid abdominal wound. PEG tubed placed 4/21. Dentistry evaluated pt's mouth pain. Found lower lip is sinking into her lower implants causing ulcerations on both sides of her inner lip; chlorhexidine rinses, salter water rinses, and 4x4 guaze placed on upper side of lip were recommended. Palliative care has been consulted, however family wants aggressive interventions. Therapy evaluations completed and CIR recommended d/t pt's deficits in functional mobility and ability to complete ADLs.   Patient[facility] medical [facility] from [facility] has been reviewed by the rehabilitation admission coordinator and physician.  Past Medical History  Past Medical History:  Diagnosis Date  . Allergic rhinitis   . Allergy   . CAD (  coronary artery disease)    Mild CAD by cath 2008  . CHF (congestive heart failure) ([place])   . DJD (degenerative joint disease)    rheumatoid  . DVT (deep venous thrombosis) ([place])   . Dyslipidemia   . History of echocardiogram    Echo 12/2018: EF 60-65, mod asymmetric LVH, Gr 1 DD  . History of nuclear stress test    Myoview 5/17:  EF 74%, normal perfusion, low risk // Myoview 12/2018:  EF 89, very mild ischemia in inferoapical wall; Low Risk  . History of pulmonary embolism   . Hyperlipidemia   . Insomnia   . Osteoporosis   . Psoriasis   . Pulmonary fibrosis ([place])   . Rheumatoid arthritis ([place])     Family History   family history includes  Alzheimer's disease in her sister and sister; Cancer in her daughter; Heart attack (age of onset: 8) in her father; Heart disease in her sister; Kidney disease (age of onset: 30) in her daughter.  Prior Rehab/Hospitalizations Has the patient had prior rehab or hospitalizations prior to admission? Yes  Has the patient had major surgery during 100 days prior to admission? Yes             Current Medications  Current Facility-Administered Medications:  .  (feeding supplement) PROSource Plus liquid 30 mL, 30 mL, Oral, Daily, Sheikh, Omair Latif, DO, 30 mL at 11/03/20 1302 .  acetaminophen (TYLENOL) tablet 650 mg, 650 mg, Oral, Q6H PRN, 650 mg at 11/04/20 0838 **OR** acetaminophen (TYLENOL) suppository 650 mg, 650 mg, Rectal, Q6H PRN, Green, Terri L, RPH .  benzocaine (ORAJEL) 10 % mucosal gel, , Mouth/Throat, QID PRN, Donalynn Furlong, NP, Given at 11/04/20 [phone] .  chlorhexidine (PERIDEX) 0.12 % solution 15 mL, 15 mL, Mouth/Throat, QID, Raiford Noble [place], DO, 15 mL at 11/03/20 2146 .  diphenhydrAMINE (BENADRYL) injection 12.5 mg, 12.5 mg, Intravenous, Q6H PRN, Sheikh, Omair Latif, DO .  enoxaparin (LOVENOX) injection 30 mg, 30 mg, Subcutaneous, Daily, Sheikh, Omair Latif, DO, 30 mg at 11/03/20 1302 .  famotidine (PEPCID) 40 MG/5ML suspension 20 mg, 20 mg, Per Tube, Daily, Adrian Saran, RPH, 20 mg at 11/03/20 1305 .  feeding supplement (BOOST / RESOURCE BREEZE) liquid 1 Container, 1 Container, Oral, TID BM, Patrecia Pour, MD, 1 Container at 11/03/20 1347 .  feeding supplement (OSMOLITE 1.2 CAL) liquid 1,000 mL, 1,000 mL, Per Tube, Continuous, Sheikh, Omair Latif, DO, Last Rate: 50 mL/hr at 11/04/20 0100, 1,000 mL at 11/04/20 0100 .  fluconazole (DIFLUCAN) tablet 100 mg, 100 mg, Oral, Daily, Vance Gather B, MD, 100 mg at 11/03/20 1258 .  fluticasone (FLONASE) 50 MCG/ACT nasal spray 2 spray, 2 spray, Each Nare, Daily PRN, Vance Gather B, MD .  HYDROmorphone (DILAUDID) injection 0.5-1 mg, 0.5-1  mg, Intravenous, Q4H PRN, Patrecia Pour, MD, 1 mg at 11/03/20 1715 .  insulin aspart (novoLOG) injection 0-6 Units, 0-6 Units, Subcutaneous, Q4H, Raiford Noble [place], [place], 1 Units at 11/04/20 [id number] .  lipase/protease/amylase (CREON) capsule 36,000 Units, 36,000 Units, Oral, TID AC, Patrecia Pour, MD, 36,000 Units at 11/04/20 [phone] .  magic mouthwash w/lidocaine, 5 mL, Oral, TID, Patrecia Pour, MD, 5 mL at 11/04/20 0838 .  mirtazapine (REMERON SOL-TAB) disintegrating tablet 15 mg, 15 mg, Oral, QHS, Patrecia Pour, MD, 15 mg at 11/03/20 2146 .  multivitamin with minerals tablet 1 tablet, 1 tablet, Oral, Daily, Patrecia Pour, MD, 1 tablet at 11/03/20 1300 .  mycophenolate (CELLCEPT)  capsule 1,000 mg, 1,000 mg, Oral, BID, Patrecia Pour, MD, 1,000 mg at 11/03/20 2146 .  Nintedanib (Ofev) CAPS 100 mg, 100 mg, Oral, BID, Minda Ditto, RPH, 100 mg at 11/03/20 2313 .  ondansetron (ZOFRAN) tablet 4 mg, 4 mg, Oral, Q6H PRN **OR** ondansetron (ZOFRAN) injection 4 mg, 4 mg, Intravenous, Q6H PRN, Vance Gather B, MD .  predniSONE (DELTASONE) tablet 5 mg, 5 mg, Oral, Q breakfast, Patrecia Pour, MD, 5 mg at 11/04/20 4403  Patients Current Diet:     Diet Order                  DIET - DYS 1 Room service appropriate? Yes; Fluid consistency: Thin  Diet effective now                  Precautions / Restrictions Precautions Precautions: Fall Precaution Comments: NPWT abdomen Restrictions Weight Bearing Restrictions: No   Has the patient had 2 or more falls or a fall with injury in the past year? Yes  Prior Activity Level Household: wasn't leaving home because of wound per nephew's report. Pt is a Fish farm manager  Prior Functional Level Self Care: Did the patient need help bathing, dressing, using the toilet or eating? Independent  Indoor Mobility: Did the patient need assistance with walking from room to room (with or without device)? Independent  Stairs: Did the patient need assistance  with internal or external stairs (with or without device)? Independent  Functional Cognition: Did the patient need help planning regular tasks such as shopping or remembering to take medications? Needed some help  Home Assistive Devices / Equipment Home Assistive Devices/Equipment: Dentures (specify type),Cane (specify quad or straight),Other (Comment),Walker (specify type) (upper and lower dentures, single point cane, walk in shower, handicap height shower) Home Equipment: Cane - single point,Walker - 2 wheels  Prior Device Use: Indicate devices/aids used by the patient prior to current illness, exacerbation or injury? Manual wheelchair and Walker  Current Functional Level Cognition  Overall Cognitive Status: Within Functional Limits for tasks assessed Orientation Level: Oriented to situation,Oriented to person General Comments: Pt with decreased attention to task and education today due to pain. Pt closing eyes at times and turning away.    Extremity Assessment (includes Sensation/Coordination)  Upper Extremity Assessment: Generalized weakness (arthritic changes in hands) RUE Deficits / Details: ROM WFL; MMT 4-/5 throughout RUE Sensation: WNL RUE Coordination: WNL LUE Deficits / Details: ROM WFL; MMT 4-/5 throughout LUE Sensation: WNL LUE Coordination: WNL  Lower Extremity Assessment: Defer to PT evaluation RLE Deficits / Details: ROM: WFL; MMT: 4/5 hip and knee, 0/5 ankle dorsiflexion, 3/5 plantarflexion; reports new foot drop RLE Sensation: WNL RLE Coordination: WNL LLE Deficits / Details: ROM WFL; MMT 4/5 throughout LLE Sensation: WNL LLE Coordination: WNL    ADLs  Overall ADL's : Needs assistance/impaired Eating/Feeding: Set up,Bed level Grooming: Wash/dry face,Bed level,Minimal assistance Grooming Details (indicate cue type and reason): Pt stated that she could not tolerate sitting EOB for grooming today due to abdominal pain. Upper Body Bathing: Sitting,Maximal  assistance Lower Body Bathing: Total assistance,Bed level (Applied lotion to dry appeared lower legs. Pt stated that the lotion was cold despite trying to warm up in hands and requested no further ADL activities.) Upper Body Dressing : Moderate assistance,Sitting Lower Body Dressing: Total assistance,Sit to/from stand Toilet Transfer: Moderate assistance,Stand-pivot,RW,BSC Toilet Transfer Details (indicate cue type and reason): unsteady, poor safety awareness Toileting- Clothing Manipulation and Hygiene: Supervision/safety,Set up,Sitting/lateral lean Toileting - Clothing Manipulation Details (indicate  cue type and reason): for peri care after voiding, patient able to complete seated on commode Functional mobility during ADLs: Moderate assistance,Rolling walker,Cueing for safety,Cueing for sequencing    Mobility  Overal bed mobility: Needs Assistance Bed Mobility: Rolling,Sidelying to Sit Rolling: Min guard Sidelying to sit: Min assist Supine to sit: Min assist,HOB elevated Sit to supine: Min assist General bed mobility comments: Cues needed to initaite rolling to Rt side, min assist to raise trunk upright and bring LE's off EOB fully.    Transfers  Overall transfer level: Needs assistance Equipment used: Rolling walker (2 wheeled) Transfers: Sit to/from [employer] Sit to Stand: Min assist Stand pivot transfers: Mod assist General transfer comment: Cues needed for safety with rising from EOB. min assist to steady with rise. Pt's Rt LE resting in abducted position and pt unable to bring it within BOS/walker frame. Mod assist for safety with walker management and to guide direction of turn to sit in recliner. Pt fatgieud and greatly limited by pain with steps to chair.    Ambulation / Gait / Stairs / Wheelchair Mobility  Ambulation/Gait Ambulation/Gait assistance: Mod assist Gait Distance (Feet): 3 Feet Assistive device: Rolling walker (2 wheeled) Gait  Pattern/deviations: Step-to pattern,Decreased stride length,Decreased dorsiflexion - right,Shuffle,Trunk flexed General Gait Details: pt required Mod assist to complete small side steps from EOB to recliner. pt unsteady and with poor safety awareness for RW management. Pt's Rt LE abducted and pt unable to control position to bring Rt foot underhip for improved posture/alignment. Gait velocity: decr    Posture / Balance Dynamic Sitting Balance Sitting balance - Comments: reliant on UE support Balance Overall balance assessment: Needs assistance Sitting-balance support: Feet supported Sitting balance-[name]: Fair Sitting balance - Comments: reliant on UE support Postural control: Posterior lean Standing balance support: During functional activity,Bilateral upper extremity supported Standing balance-[name]: Poor Standing balance comment: reliant on UE support and min to mod A for safety    Special needs/care consideration Wound Vac placed on abdominal wound, Skin Pressure injury: sacrum, mid stage 2; Surgical wound: abdomen/lower, medial; Surigical wound: abdomen/mid open wound (proximal to midline abdominal wound); Ecchymois: arm/bilateral; Catheter entry/exit: abdomen/medial, PEG tube, Bowel and Bladder incontinence and Designated visitor Jamey Ripa, nephew   Previous Home Environment (from acute therapy documentation) Living Arrangements: Alone Available Help at Discharge: Family,Available 24 hours/day,Other (Comment) (family has a team of caregivers able to provide 24/7 care) Type of Home: House Home Layout: One level Home Access: Level entry Entrance Stairs-Number of Steps: 2 Bathroom Shower/Tub: Multimedia programmer: Handicapped height Bathroom Accessibility: Yes How Accessible: Accessible via wheelchair,Accessible via [place]: Yes Type of [place]: [employer] (if known): [place] Additional  Comments: Information from prior visit - pt unable to provide today  Discharge Living Setting Plans for Discharge Living Setting: Patient's home Type of Home at Discharge: House Discharge Home Layout: One level Discharge Home Access: Level entry Discharge Bathroom Shower/Tub: Walk-in shower Discharge [place]: Handicapped height Discharge Bathroom Accessibility: Yes How Accessible: Accessible via wheelchair,Accessible via walker Does the patient have any problems obtaining your medications?: No  Social/Family/Support Systems Anticipated Caregiver: Jamey Ripa, nephew- Family states they will either hire 24/7 or rotate to provide care at home at discharge. They have provided 24/7 assist to her before.  Anticipated Caregiver's Contact Information: [phone] Caregiver Availability: 24/7 Discharge Plan Discussed with Primary Caregiver: Yes Is Caregiver In Agreement with Plan?: Yes Does Caregiver/Family have Issues with  Lodging/Transportation while Pt is in Rehab?: No  Goals Patient/Family Goal for Rehab: PT/OT/SLP min A Expected length of stay: 14-16 days Pt/Family Agrees to Admission and willing to participate: Yes Program Orientation Provided & Reviewed with Pt/Caregiver Including Roles  & Responsibilities: Yes  Decrease burden of Care through IP rehab admission: NA  Possible need for SNF placement upon discharge: NA  Patient Condition: I have reviewed medical records from [facility], spoken with CSW, and family member. I discussed via phone for inpatient rehabilitation assessment.  Patient will benefit from ongoing PT, OT and SLP, can actively participate in 3 hours of therapy a day 5 days of the week, and can make measurable gains during the admission.  Patient will also benefit from the coordinated team approach during an Inpatient Acute Rehabilitation admission.  The patient will receive intensive therapy as well as Rehabilitation physician,  nursing, social worker, and care management interventions.  Due to bladder management, bowel management, safety, skin/wound care, disease management, medication administration, pain management and patient education the patient requires 24 hour a day rehabilitation nursing.  The patient is currently minA-total A with mobility and basic ADLs.  Discharge setting and therapy post discharge at home with home health is anticipated.  Patient has agreed to participate in the Acute Inpatient Rehabilitation Program and will admit today.  Preadmission Screen Completed By:  Genella Mech, 11/04/2020 10:44 AM ______________________________________________________________________   Discussed status with Dr. Posey Pronto  on 11/04/2020 at 30  and received approval for admission today.  Admission Coordinator:  Genella Mech, CCC-SLP,  With updates by Clemens Catholic CCC-SLP time [id number] /Date 11/04/2020   Assessment/Plan: Diagnosis: Debility 1. Does the need for close, 24 hr/day Medical supervision in concert with the patient's rehab needs make it unreasonable for this patient to be served in a less intensive setting? Yes  2. Co-Morbidities requiring supervision/potential complications: RA, mild CAD, h/o DVT and PE, UIP on 2L O2 via nasal cannula, osteoporosis, HTN (monitor and provide prns in accordance with increased physical exertion and pain), SBO  3. Due to bowel management, safety, skin/wound care, disease management, pain management and patient education, does the patient require 24 hr/day rehab nursing? Yes 4. Does the patient require coordinated care of a physician, rehab nurse, PT, OT, and SLP to address physical and functional deficits in the context of the above medical diagnosis(es)? Yes Addressing deficits in the following areas: balance, endurance, locomotion, strength, transferring, bathing, dressing, toileting, cognition, swallowing and psychosocial support 5. Can the patient actively participate in an  intensive therapy program of at least 3 hrs of therapy 5 days a week? Yes 6. The potential for patient to make measurable gains while on inpatient rehab is excellent 7. Anticipated functional outcomes upon discharge from inpatient rehab: supervision and min assist PT, supervision and min assist OT, modified independent and supervision SLP 8. Estimated rehab length of stay to reach the above functional goals is: 11-15 days. 9. Anticipated discharge destination: Home 10. Overall Rehab/Functional Prognosis: good   MD Signature: Delice Lesch, MD, ABPMR

## 2020-11-04 NOTE — TOC Transition Note (Signed)
Transition of Care [facility]) - CM/SW Discharge Note   Patient Details  Name: [name] [name] MRN: [id number] Date of Birth: 1930-08-17  [facility] of [facility]) CM/SW Contact:  Trish Mage, LCSW Phone Number: 11/04/2020, 10:45 AM   Clinical Narrative:   Patient who is stable for d/c will transfer to CIR today.  Family informed.  Transportation to be arranged by CIR, who will also communicate with RN for report. TOC sign off.    Final next level of care: Other (comment) (CIR) Barriers to Discharge: Barriers Resolved   Patient Goals and CMS Choice     Choice offered to / list presented to : [facility] POA / Guardian (sister Ms Jannifer Franklin)  Discharge Placement                       Discharge Plan and Services   Discharge Planning Services: CM Consult Post Acute Care Choice: [place]                               Social Determinants of Health (SDOH) Interventions     Readmission Risk Interventions No flowsheet data found.

## 2020-11-04 NOTE — Progress Notes (Signed)
Subjective: CC: Seen with wocn. They report no leakage around g-tube. She has been titrated up to 50cc/hr of TF's with no emesis. Last BM 4/25  Objective: Vital signs in last 24 hours: Temp:  [97.8 F (36.6 C)-99.1 F (37.3 C)] 97.8 F (36.6 C) (04/27 0353) Pulse Rate:  [78-94] 78 (04/27 0353) Resp:  [14-15] 15 (04/27 0353) BP: (87-91)/(52-63) 87/55 (04/27 0353) SpO2:  [93 %-96 %] 96 % (04/27 0353) Weight:  [42.8 kg] 42.8 kg (04/27 0500) Last BM Date: 11/02/20  Intake/Output from previous day: 04/26 0701 - 04/27 0700 In: 571.5 [NG/GT:521.5; IV Piggyback:50] Out: -  Intake/Output this shift: No intake/output data recorded.  PE: Abd: soft, cachetic, midline wound noted an stable from picture on 4/25. There is granulation tissue at the base of upper and lower midline wound. They do not connect. No tunneling or drainage. G-tube with healing skin maceration that is much improved from the beginning of hospital stay.   Lab Results:  Recent Labs    11/03/20 0521 11/04/20 0506  WBC 10.8* 10.8*  HGB 10.4* 10.3*  HCT 32.8* 32.3*  PLT 231 254   BMET Recent Labs    11/03/20 0521 11/04/20 0506  NA 130* 133*  K 3.9 4.0  CL 99 98  CO2 25 27  GLUCOSE 95 99  BUN 9 12  CREATININE 0.44 0.50  CALCIUM 8.3* 8.4*   PT/INR No results for input(s): LABPROT, INR in the last 72 hours. CMP     Component Value Date/Time   NA 133 (L) 11/04/2020 0506   NA 138 04/29/2020 0948   K 4.0 11/04/2020 0506   CL 98 11/04/2020 0506   CO2 27 11/04/2020 0506   GLUCOSE 99 11/04/2020 0506   BUN 12 11/04/2020 0506   BUN 11 04/29/2020 0948   CREATININE 0.50 11/04/2020 0506   CREATININE 0.74 11/30/2015 1327   CALCIUM 8.4 (L) 11/04/2020 0506   PROT 5.6 (L) 11/04/2020 0506   PROT 6.5 04/29/2020 0948   ALBUMIN 2.2 (L) 11/04/2020 0506   ALBUMIN 3.8 04/29/2020 0948   AST 34 11/04/2020 0506   ALT 26 11/04/2020 0506   ALKPHOS 73 11/04/2020 0506   BILITOT 0.3 11/04/2020 0506   BILITOT 0.7  04/29/2020 0948   GFRNONAA >60 11/04/2020 0506   GFRAA 69 04/29/2020 0948   Lipase     Component Value Date/Time   LIPASE 24 10/16/2020 1659       Studies/Results: No results found.  Anti-infectives: Anti-infectives (From admission, onward)   Start     Dose/Rate Route Frequency Ordered Stop   10/26/20 1300  fluconazole (DIFLUCAN) tablet 100 mg        100 mg Oral Daily 10/26/20 1145     10/26/20 1230  sulfamethoxazole-trimethoprim (BACTRIM DS) 800-160 MG per tablet 1 tablet        1 tablet Oral Every 12 hours 10/26/20 1145 10/27/20 2219   10/24/20 1600  fluconazole (DIFLUCAN) IVPB 100 mg  Status:  Discontinued        100 mg 50 mL/hr over 60 Minutes Intravenous Every 24 hours 10/23/20 2009 10/26/20 1145   10/24/20 1000  sulfamethoxazole-trimethoprim (BACTRIM) 160 mg in dextrose 5 % 250 mL IVPB  Status:  Discontinued        160 mg 260 mL/hr over 60 Minutes Intravenous Every 12 hours 10/23/20 2025 10/26/20 1145   10/23/20 2100  sulfamethoxazole-trimethoprim (BACTRIM) 160 mg in dextrose 5 % 250 mL IVPB  160 mg 260 mL/hr over 60 Minutes Intravenous  Once 10/23/20 2025 10/23/20 2220   10/23/20 1715  cefTRIAXone (ROCEPHIN) 2 g in sodium chloride 0.9 % 100 mL IVPB        2 g 200 mL/hr over 30 Minutes Intravenous  Once 10/23/20 1704 10/23/20 1805   10/23/20 1715  metroNIDAZOLE (FLAGYL) IVPB 500 mg  Status:  Discontinued        500 mg 100 mL/hr over 60 Minutes Intravenous  Once 10/23/20 1704 10/23/20 1937   10/23/20 1545  fluconazole (DIFLUCAN) tablet 200 mg        200 mg Oral  Once 10/23/20 1541 10/23/20 1608       Assessment/Plan SPCM/FTT -Nutrition following. On continuous TFs  Stomatitis -per medicine Thrush - Per medicine   Postop from ex lap with detorsion of SB with placement of g-tube on 08/23/20 by Dr. Zenia Resides Skin excoriation from leaking g-tube site - No infection noted - Midline wound - WOCN recommending d/c vac and BID WTD. Okay to d/c vac and switch to  wtd.  - Gerhardt's butt cream for a barrier. Appreciate WOCN assistance with this. No further leakage. TF's at goal.  - Cont TFs as above. Cont calorie count - Patient is surgically stable for DC to CIR when medically stable.   FEN -D1 diet, TFs VTE - Lovenox ID - diflucan(oral thrush) Follow up - Dr. Zenia Resides   LOS: 12 days    Jillyn Ledger , [facility] Surgery 11/04/2020, 9:08 AM Please see Amion for pager number during day hours 7:00am-4:30pm

## 2020-11-04 NOTE — H&P (Signed)
Physical Medicine and Rehabilitation Admission H&P    Chief Complaint  Patient presents with  . Wound Infection  : HPI: [name] [name] is an 87 year old right-handed female with history of chronic hypoxic respiratory failure /interstitial pneumonia/pulmonary fibrosis on 2 L oxygen, chronic prednisone/CellCept for rheumatoid arthritis, history of DVT/PE, SBO status post exploratory laparotomy February 2022 with G-tube placement 08/23/2020 that is since been removed.  History taken from chart review and patient. Per chart review patient lives alone.  1 level home 2 steps to entry.  Patient reportedly independent until 2 months ago she now has an aide a few hours a day 2 days a week.  She has had a couple of falls in the last couple of weeks.  She presented on 10/23/2020 with increasing abdominal pain and some wound drainage from prior G-tube and some AMS as well as right foot drop.  She was admitted a few weeks ago in February with complications from her exploratory laparotomy requiring TPN and was discharged to a skilled nursing facility for short time.  Admission chemistry sodium 146, potassium 5/3, WBC 11,400, hemoglobin 11.9, lactic acid 2.7-3.4, blood cultures no growth to date, COVID-negative.  CT of abdomen pelvis showed no acute process in the abdomen or pelvis.  Cranial CT scan unremarkable for acute intracranial process. General surgery consulted initially with wound VAC later discontinued changed to wet-to-dry dressings..  Palliative care consulted for goals of care.  Due to patient's poor p.o. intake interventional radiology consulted as well as ongoing significant dysphagia, G-tube was placed 10/29/2020 per interventional radiology.  Patient did have some thrush and mouth pain CT maxillofacial showed fairly diffuse mucosal enhancement within the oral cavity likely reflecting sequela of reported thrush/mucositis.  Dr.Owsley consulted and felt her lower lip was sinking into her lower implants  causing some ulceration of both sides of her inner lip and maintained on chlorhexidine.  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 Maintained on Lovenox for DVT prophylaxis.  Therapy evaluations completed due to patient's debility was admitted for a comprehensive rehab program. Please see preadmission assessment from earlier today as well.   Review of Systems  Constitutional: Negative for chills and fever.  HENT: Negative for hearing loss.   Eyes: Negative for blurred vision and double vision.  Respiratory: Negative for cough and shortness of breath.   Cardiovascular: Negative for chest pain and palpitations.  Gastrointestinal: Positive for abdominal pain and constipation. Negative for heartburn and nausea.  Genitourinary: Negative for dysuria, flank pain and hematuria.  Musculoskeletal: Positive for joint pain and myalgias.  Skin: Negative for rash.  Neurological: Positive for weakness.  Psychiatric/Behavioral: The patient has insomnia.   All other systems reviewed and are negative.  Past Medical History:  Diagnosis Date  . Allergic rhinitis   . Allergy   . CAD (coronary artery disease)    Mild CAD by cath 2008  . CHF (congestive heart failure) ([place])   . DJD (degenerative joint disease)    rheumatoid  . DVT (deep venous thrombosis) ([place])   . Dyslipidemia   . History of echocardiogram    Echo 12/2018: EF 60-65, mod asymmetric LVH, Gr 1 DD  . History of nuclear stress test    Myoview 5/17:  EF 74%, normal perfusion, low risk // Myoview 12/2018:  EF 89, very mild ischemia in inferoapical wall; Low Risk  . History of pulmonary embolism   . Hyperlipidemia   . Insomnia   . Osteoporosis   .  Psoriasis   . Pulmonary fibrosis ([place])   . Rheumatoid arthritis [facility])    Past Surgical History:  Procedure Laterality Date  . ABDOMINAL HYSTERECTOMY  1974  . APPENDECTOMY  1959  . CATARACT EXTRACTION    . IR REPLC GASTRO/COLONIC TUBE PERCUT W/FLUORO  10/29/2020   . LAPAROTOMY N/A 08/23/2020   Procedure: EXPLORATORY LAPAROTOMY small bowel resection gastrostomy tube placement;  Surgeon: Dwan Bolt, MD;  Location: [facility];  Service: General;  Laterality: N/A;  . LEFT HEART CATHETERIZATION WITH CORONARY ANGIOGRAM N/A 06/18/2013   Procedure: LEFT HEART CATHETERIZATION WITH CORONARY ANGIOGRAM;  Surgeon: Peter M Martinique, MD;  Location: [facility] CATH LAB;  Service: Cardiovascular;  Laterality: N/A;  . TONSILLECTOMY  1941, 1951  . TOTAL KNEE ARTHROPLASTY  1997, 2007   Bilateral   Family History  Problem Relation Age of Onset  . Kidney disease Daughter 5  . Cancer Daughter   . Heart attack Father 41  . Alzheimer's disease Sister   . Heart disease Sister   . Alzheimer's disease Sister    Social History:  reports that she quit smoking about 47 years ago. Her smoking use included cigarettes. She has a 10.00 pack-year smoking history. She has never used smokeless tobacco. She reports that she does not drink alcohol and does not use drugs. Allergies:  Allergies  Allergen Reactions  . Crestor [Rosuvastatin] Other (See Comments)    Muscle pain  . Lactose Intolerance (Gi) Other (See Comments)    Stomach upset  . Penicillins Hives  . Imuran [Azathioprine] Nausea And Vomiting   Medications Prior to Admission  Medication Sig Dispense Refill  . acetaminophen (TYLENOL) 160 MG/5ML solution Take 160 mg by mouth every 6 (six) hours as needed for moderate pain.    . [EXPIRED] cephALEXin (KEFLEX) 500 MG capsule Take 1 capsule (500 mg total) by mouth 2 (two) times daily for 7 days. (Patient taking differently: Take 500 mg by mouth 2 (two) times daily. Start date : 10/20/20) 14 capsule 0  . Ensure Plus (ENSURE PLUS) LIQD Take 237 mLs by mouth daily as needed (nutritional supplement).    . fluconazole (DIFLUCAN) 150 MG tablet Take 1 tablet (150 mg total) by mouth every 3 (three) days. 2 tablet 0  . [EXPIRED] HYDROcodone-acetaminophen (NORCO) 10-325 MG tablet Take 1-2 tablets  by mouth 2 (two) times daily as needed for up to 5 days. 20 tablet 0  . InFLIXimab (REMICADE IV) Inject 1 Dose into the vein as directed. Every 2 months    . lipase/protease/amylase (CREON) 36000 UNITS CPEP capsule Take 1 capsule (36,000 Units total) by mouth 3 (three) times daily before meals. 180 capsule 11  . mupirocin ointment (BACTROBAN) 2 % Apply 1 application topically 2 (two) times daily. 22 g 0  . mycophenolate (CELLCEPT) 500 MG tablet 2 tabs bid (Patient taking differently: Take 1,000 mg by mouth 2 (two) times daily.) 360 tablet 3  . Nintedanib (OFEV) 100 MG CAPS Take 1 capsule (100 mg total) by mouth 2 (two) times daily. 60 capsule 11  . nitroGLYCERIN (NITROSTAT) 0.4 MG SL tablet Place 0.4 mg under the tongue every 5 (five) minutes as needed for chest pain.    . potassium chloride (KLOR-CON) 10 MEQ tablet Take 20 Meq twice daily x 2 days and then 20 Meq daily (Patient taking differently: Take 10 mEq by mouth every other day.) 60 tablet 1  . predniSONE (DELTASONE) 5 MG tablet TAKE 1 TABLET DAILY WITH BREAKFAST (Patient taking differently: Take 5 mg  by mouth daily with breakfast.) 90 tablet 3  . Skin Protectants, Misc. (ILEX SKIN PROTECTANT EX) Apply 1 application topically 2 (two) times daily.    . famotidine (PEPCID) 20 MG tablet Take 1 tablet (20 mg total) by mouth daily. (Patient not taking: No sig reported)    . fluticasone (FLONASE) 50 MCG/ACT nasal spray Place 2 sprays into both nostrils daily as needed for allergies or rhinitis. (Patient not taking: No sig reported) 16 g 3  . furosemide (LASIX) 20 MG tablet Take 1 tablet by mouth daily as needed for swelling. (Patient not taking: No sig reported) 30 tablet 11  . nitroGLYCERIN (NITROSTAT) 0.4 MG SL tablet Place 1 tablet (0.4 mg total) under the tongue every 5 (five) minutes as needed for chest pain. (Patient not taking: Reported on 10/23/2020) 25 tablet 7  . Nutritional Supplements (,FEEDING SUPPLEMENT, PROSOURCE PLUS) liquid Take 30 mLs  by mouth 2 (two) times daily between meals. (Patient not taking: No sig reported)      Drug Regimen Review Drug regimen was reviewed and remains appropriate with no significant issues identified  Home: Home Living Family/patient expects to be discharged to:: Unsure Living Arrangements: Alone Available Help at Discharge: Family,Available 24 hours/day,Other (Comment) (family has a team of caregivers able to provide 24/7 care) Type of Home: House Home Access: Level entry Entrance Stairs-Number of Steps: 2 Home Layout: One level Bathroom Shower/Tub: Multimedia programmer: Handicapped height Bathroom Accessibility: Yes Home Equipment: [place] - single point,Walker - 2 wheels Additional Comments: Information from prior visit - pt unable to provide today   Functional History: Prior Function Level of Independence: Needs assistance Comments: Pt unable to provide PLOF.  From visit ~ 2 months ago she was independent with ambulation and had an aide a few hr/day 2 days/week.  Has had recent falls.  Did not in [facility] P pt d/c to SNF for 2 weeks after last admission then to ALF with 24 hr care.  Functional Status:  Mobility: Bed Mobility Overal bed mobility: Needs Assistance Bed Mobility: Rolling,Sidelying to Sit Rolling: Min guard Sidelying to sit: Min assist Supine to sit: Min assist,HOB elevated Sit to supine: Min assist General bed mobility comments: Cues needed to initaite rolling to Rt side, min assist to raise trunk upright and bring LE's off EOB fully. Transfers Overall transfer level: Needs assistance Equipment used: Rolling walker (2 wheeled) Transfers: Sit to/from [employer] Sit to Stand: Min assist Stand pivot transfers: Mod assist General transfer comment: Cues needed for safety with rising from EOB. min assist to steady with rise. Pt's Rt LE resting in abducted position and pt unable to bring it within BOS/walker frame. Mod assist for safety with  walker management and to guide direction of turn to sit in recliner. Pt fatgieud and greatly limited by pain with steps to chair. Ambulation/Gait Ambulation/Gait assistance: Mod assist Gait Distance (Feet): 3 Feet Assistive device: Rolling walker (2 wheeled) Gait Pattern/deviations: Step-to pattern,Decreased stride length,Decreased dorsiflexion - right,Shuffle,Trunk flexed General Gait Details: pt required Mod assist to complete small side steps from EOB to recliner. pt unsteady and with poor safety awareness for RW management. Pt's Rt LE abducted and pt unable to control position to bring Rt foot underhip for improved posture/alignment. Gait velocity: decr    ADL: ADL Overall ADL's : Needs assistance/impaired Eating/Feeding: Set up,Bed level Grooming: Wash/dry face,Bed level,Minimal assistance Grooming Details (indicate cue type and reason): Pt stated that she could not tolerate sitting EOB for grooming today  due to abdominal pain. Upper Body Bathing: Sitting,Maximal assistance Lower Body Bathing: Total assistance,Bed level (Applied lotion to dry appeared lower legs. Pt stated that the lotion was cold despite trying to warm up in hands and requested no further ADL activities.) Upper Body Dressing : Moderate assistance,Sitting Lower Body Dressing: Total assistance,Sit to/from stand Toilet Transfer: Moderate assistance,Stand-pivot,RW,BSC Toilet Transfer Details (indicate cue type and reason): unsteady, poor safety awareness Toileting- Clothing Manipulation and Hygiene: Supervision/safety,Set up,Sitting/lateral lean Toileting - Clothing Manipulation Details (indicate cue type and reason): for peri care after voiding, patient able to complete seated on commode Functional mobility during ADLs: Moderate assistance,Rolling walker,Cueing for safety,Cueing for sequencing  Cognition: Cognition Overall Cognitive Status: Within Functional Limits for tasks assessed Orientation Level: Oriented to  situation,Oriented to person Cognition Arousal/Alertness: Awake/alert Behavior During Therapy: Flat affect Overall Cognitive Status: Within Functional Limits for tasks assessed General Comments: Pt with decreased attention to task and education today due to pain. Pt closing eyes at times and turning away.  Physical Exam: Blood pressure (!) 87/55, pulse 78, temperature 97.8 F (36.6 C), temperature source Oral, resp. rate 15, height 5' 4.5" (1.638 m), weight 42.8 kg, SpO2 96 %. Physical Exam Vitals reviewed.  Constitutional:      General: She is not in acute distress.    Comments: Cachectic  HENT:     Head: Normocephalic and atraumatic.     Right Ear: External ear normal.     Left Ear: External ear normal.     Nose: Nose normal.  Eyes:     General:        Right eye: No discharge.        Left eye: No discharge.     Extraocular Movements: Extraocular movements intact.  Cardiovascular:     Rate and Rhythm: Normal rate and regular rhythm.  Pulmonary:     Effort: Pulmonary effort is normal. No respiratory distress.     Breath sounds: No stridor.  Abdominal:     General: Abdomen is flat. Bowel sounds are normal.     Comments: +PEG  Musculoskeletal:     Cervical back: Normal range of motion and neck supple.     Comments: No edema or tenderness in extremities  Skin:    Comments: Stage III sacral ulcer Ulcer on left upper posterior pelvis Abdominal open wound  Neurological:     Mental Status: She is alert.     Comments: Alert and oriented HOH Motor: B/l UE: 4+/5 proximal to distal RLE: 4/5 proximal to distal LLE: 4/5 HF, KE, 3-/5 ADF  Psychiatric:        Mood and Affect: Mood normal.        Behavior: Behavior normal.     Results for orders placed or performed during the hospital encounter of 10/23/20 (from the past 48 hour(s))  Glucose, capillary     Status: Abnormal   Collection Time: 11/02/20 11:30 AM  Result Value Ref Range   Glucose-Capillary 121 (H) 70 - 99 mg/dL     Comment: Glucose reference range applies only to samples taken after fasting for at least 8 hours.  Glucose, capillary     Status: Abnormal   Collection Time: 11/02/20  4:32 PM  Result Value Ref Range   Glucose-Capillary 298 (H) 70 - 99 mg/dL    Comment: Glucose reference range applies only to samples taken after fasting for at least 8 hours.  Glucose, capillary     Status: Abnormal   Collection Time: 11/02/20  8:21 PM  Result Value Ref Range   Glucose-Capillary 141 (H) 70 - 99 mg/dL    Comment: Glucose reference range applies only to samples taken after fasting for at least 8 hours.   Comment 1 Notify RN    Comment 2 Document in Chart   Glucose, capillary     Status: Abnormal   Collection Time: 11/02/20 11:21 PM  Result Value Ref Range   Glucose-Capillary 110 (H) 70 - 99 mg/dL    Comment: Glucose reference range applies only to samples taken after fasting for at least 8 hours.   Comment 1 Notify RN    Comment 2 Document in Chart   Glucose, capillary     Status: Abnormal   Collection Time: 11/03/20  4:06 AM  Result Value Ref Range   Glucose-Capillary 119 (H) 70 - 99 mg/dL    Comment: Glucose reference range applies only to samples taken after fasting for at least 8 hours.   Comment 1 Notify RN    Comment 2 Document in Chart   Comprehensive metabolic panel     Status: Abnormal   Collection Time: 11/03/20  5:21 AM  Result Value Ref Range   Sodium 130 (L) 135 - 145 mmol/L   Potassium 3.9 3.5 - 5.1 mmol/L   Chloride 99 98 - 111 mmol/L   CO2 25 22 - 32 mmol/L   Glucose, Bld 95 70 - 99 mg/dL    Comment: Glucose reference range applies only to samples taken after fasting for at least 8 hours.   BUN 9 8 - 23 mg/dL   Creatinine, Ser 0.44 0.44 - 1.00 mg/dL   Calcium 8.3 (L) 8.9 - 10.3 mg/dL   Total Protein 5.5 (L) 6.5 - 8.1 g/dL   Albumin 2.1 (L) 3.5 - 5.0 g/dL   AST 30 15 - 41 U/L   ALT 24 0 - 44 U/L   Alkaline Phosphatase 76 38 - 126 U/L   Total Bilirubin 0.5 0.3 - 1.2 mg/dL    GFR, Estimated >60 >60 mL/min    Comment: (NOTE) Calculated using the CKD-EPI Creatinine Equation (2021)    Anion gap 6 5 - 15    Comment: Performed at [facility], [place] [address] [place], [place] [address]  CBC with Differential/Platelet     Status: Abnormal   Collection Time: 11/03/20  5:21 AM  Result Value Ref Range   WBC 10.8 (H) 4.0 - 10.5 K/uL   RBC 3.35 (L) 3.87 - 5.11 MIL/uL   Hemoglobin 10.4 (L) 12.0 - 15.0 g/dL   HCT 32.8 (L) 36.0 - 46.0 %   MCV 97.9 80.0 - 100.0 fL   MCH 31.0 26.0 - 34.0 pg   MCHC 31.7 30.0 - 36.0 g/dL   RDW 20.1 (H) 11.5 - 15.5 %   Platelets 231 150 - 400 K/uL   nRBC 0.0 0.0 - 0.2 %   Neutrophils Relative % 47 %   Neutro Abs 5.3 1.7 - 7.7 K/uL   Lymphocytes Relative 40 %   Lymphs Abs 4.3 (H) 0.7 - 4.0 K/uL   Monocytes Relative 10 %   Monocytes Absolute 1.0 0.1 - 1.0 K/uL   Eosinophils Relative 1 %   Eosinophils Absolute 0.1 0.0 - 0.5 K/uL   Basophils Relative 1 %   Basophils Absolute 0.1 0.0 - 0.1 K/uL   Immature Granulocytes 1 %   Abs Immature Granulocytes 0.08 (H) 0.00 - 0.07 K/uL    Comment: Performed at [facility], [place]  [address] [place], [place] [address]  Phosphorus     Status: Abnormal   Collection Time: 11/03/20  5:21 AM  Result Value Ref Range   Phosphorus 2.1 (L) 2.5 - 4.6 mg/dL    Comment: Performed at [facility], [place] [address] [place], [place] [address]  Magnesium     Status: None   Collection Time: 11/03/20  5:21 AM  Result Value Ref Range   Magnesium 1.7 1.7 - 2.4 mg/dL    Comment: Performed at [facility], [place] [address] [place], [place] [address]  Vitamin B12     Status: None   Collection Time: 11/03/20  5:21 AM  Result Value Ref Range   Vitamin B-12 711 180 - 914 pg/mL    Comment: (NOTE) This assay is not validated for testing neonatal or myeloproliferative syndrome specimens for Vitamin B12 levels. Performed at [facility], [place] [address] [place], [place] [address]   Iron and TIBC     Status: Abnormal   Collection Time: 11/03/20  5:21 AM  Result Value Ref Range   Iron 25 (L) 28 - 170 ug/dL   TIBC 180 (L) 250 - 450 ug/dL   Saturation Ratios 14 10.4 - 31.8 %   UIBC 155 ug/dL    Comment: Performed at [facility], [place] [address], [place], [place] [address]  Ferritin     Status: Abnormal   Collection Time: 11/03/20  5:21 AM  Result Value Ref Range   Ferritin 970 (H) 11 - 307 ng/mL    Comment: Performed at [facility], [place] [address] [place], [place] [address]  Reticulocytes     Status: Abnormal   Collection Time: 11/03/20  5:21 AM  Result Value Ref Range   Retic Ct Pct 2.3 0.4 - 3.1 %   RBC. 3.32 (L) 3.87 - 5.11 MIL/uL   Retic Count, Absolute 75.0 19.0 - 186.0 K/uL   Immature Retic Fract 33.0 (H) 2.3 - 15.9 %    Comment: Performed at [facility], [place] [address] [place], [place] [address]  Folate     Status: None   Collection Time: 11/03/20  5:21 AM  Result Value Ref Range   Folate 9.7 >5.9 ng/mL    Comment: Performed at [facility], [place] [address], [place], [place] [address]  Glucose, capillary     Status: Abnormal   Collection Time: 11/03/20  7:28 AM  Result Value Ref Range   Glucose-Capillary 116 (H) 70 - 99 mg/dL    Comment: Glucose reference range applies only to samples taken after fasting for at least 8 hours.  Glucose, capillary     Status: Abnormal   Collection Time: 11/03/20 11:45 AM  Result Value Ref Range   Glucose-Capillary 126 (H) 70 - 99 mg/dL    Comment: Glucose reference range applies only to samples taken after fasting for at least 8 hours.  Glucose, capillary     Status: Abnormal   Collection Time: 11/03/20  4:29 PM  Result Value Ref Range   Glucose-Capillary 177 (H) 70 - 99 mg/dL    Comment: Glucose reference range applies only to samples taken after fasting for at least 8 hours.   Glucose, capillary     Status: Abnormal   Collection Time: 11/03/20  8:08 PM  Result Value Ref Range   Glucose-Capillary 130 (H) 70 - 99 mg/dL    Comment: Glucose reference range applies only to samples taken after fasting  for at least 8 hours.   Comment 1 Notify RN    Comment 2 Document in Chart   Glucose, capillary     Status: Abnormal   Collection Time: 11/03/20 11:28 PM  Result Value Ref Range   Glucose-Capillary 163 (H) 70 - 99 mg/dL    Comment: Glucose reference range applies only to samples taken after fasting for at least 8 hours.   Comment 1 Notify RN    Comment 2 Document in Chart   Glucose, capillary     Status: Abnormal   Collection Time: 11/04/20  3:54 AM  Result Value Ref Range   Glucose-Capillary 127 (H) 70 - 99 mg/dL    Comment: Glucose reference range applies only to samples taken after fasting for at least 8 hours.  CBC with Differential/Platelet     Status: Abnormal   Collection Time: 11/04/20  5:06 AM  Result Value Ref Range   WBC 10.8 (H) 4.0 - 10.5 K/uL   RBC 3.24 (L) 3.87 - 5.11 MIL/uL   Hemoglobin 10.3 (L) 12.0 - 15.0 g/dL   HCT 32.3 (L) 36.0 - 46.0 %   MCV 99.7 80.0 - 100.0 fL   MCH 31.8 26.0 - 34.0 pg   MCHC 31.9 30.0 - 36.0 g/dL   RDW 20.5 (H) 11.5 - 15.5 %   Platelets 254 150 - 400 K/uL   nRBC 0.0 0.0 - 0.2 %   Neutrophils Relative % 43 %   Neutro Abs 4.7 1.7 - 7.7 K/uL   Lymphocytes Relative 45 %   Lymphs Abs 5.0 (H) 0.7 - 4.0 K/uL   Monocytes Relative 10 %   Monocytes Absolute 1.1 (H) 0.1 - 1.0 K/uL   Eosinophils Relative 0 %   Eosinophils Absolute 0.0 0.0 - 0.5 K/uL   Basophils Relative 1 %   Basophils Absolute 0.1 0.0 - 0.1 K/uL   Immature Granulocytes 1 %   Abs Immature Granulocytes 0.08 (H) 0.00 - 0.07 K/uL    Comment: Performed at [facility], [place] [address] [place], [place] [address]  Comprehensive metabolic panel     Status: Abnormal   Collection Time: 11/04/20  5:06 AM  Result Value Ref Range   Sodium 133 (L)  135 - 145 mmol/L   Potassium 4.0 3.5 - 5.1 mmol/L   Chloride 98 98 - 111 mmol/L   CO2 27 22 - 32 mmol/L   Glucose, Bld 99 70 - 99 mg/dL    Comment: Glucose reference range applies only to samples taken after fasting for at least 8 hours.   BUN 12 8 - 23 mg/dL   Creatinine, Ser 0.50 0.44 - 1.00 mg/dL   Calcium 8.4 (L) 8.9 - 10.3 mg/dL   Total Protein 5.6 (L) 6.5 - 8.1 g/dL   Albumin 2.2 (L) 3.5 - 5.0 g/dL   AST 34 15 - 41 U/L   ALT 26 0 - 44 U/L   Alkaline Phosphatase 73 38 - 126 U/L   Total Bilirubin 0.3 0.3 - 1.2 mg/dL   GFR, Estimated >60 >60 mL/min    Comment: (NOTE) Calculated using the CKD-EPI Creatinine Equation (2021)    Anion gap 8 5 - 15    Comment: Performed at [facility], [place] [address] [place], [place] [address]  Phosphorus     Status: None   Collection Time: 11/04/20  5:06 AM  Result Value Ref Range   Phosphorus 2.7 2.5 - 4.6 mg/dL    Comment: Performed at [facility], [place] [address] [place], [place] [address]  Magnesium     Status: None   Collection Time: 11/04/20  5:06 AM  Result Value Ref Range   Magnesium 1.8 1.7 - 2.4 mg/dL    Comment: Performed at [facility], [place] [address], [place], [place] [address]  Glucose, capillary     Status: None   Collection Time: 11/04/20  7:55 AM  Result Value Ref Range   Glucose-Capillary 93 70 - 99 mg/dL    Comment: Glucose reference range applies only to samples taken after fasting for at least 8 hours.   No results found.     Medical Problem List and Plan: 1.  Debility secondary to abdominal wall cellulitis/sepsis complicated by chronic hypoxic respiratory failure/RA  -patient may not shower  -ELOS/Goals: 14-17 days/Min A  Admit to CIR 2.  Antithrombotics: -DVT/anticoagulation: Lovenox  -antiplatelet therapy: N/A 3. Pain Management: Tylenol as needed 4. Mood: Remeron 15 mg nightly  -antipsychotic agents: N/A 5. Neuropsych: This patient is capable of  making decisions on her own behalf. 6. Skin/Wound Care: Routine skin checks.  Foam dressing to umbilicus and change every 3 days as needed soiling.  Apply saline moistened 2 x 2 to abdomen wound daily cover with dry 2 x 2 and tape.  Apply dry split thickness gauze underneath G-tube daily. 7. Fluids/Electrolytes/Nutrition: Routine in and outs.  CMP ordered 8.  Rheumatoid arthritis.  Continue prednisone. 9.  Chronic hypoxic respiratory failure.  Oxygen therapy as directed continue chronic prednisone.  Monitor O2 sats with increased exertion 10.  Dysphagia.  Dysphagia #1 thin liquids.  G-tube replaced 10/29/2020 per interventional radiology.  Dietary follow-up  Advance diet as tolerated 11.  Thrush/mouth pain/odynophagia.  Follow Dr. Tandy Gaw, PA-C 11/04/2020  I have personally performed a face to face diagnostic evaluation, including, but not limited to relevant history and physical exam findings, of this patient and developed relevant assessment and plan.  Additionally, I have reviewed and concur with the physician assistant's documentation above.  Delice Lesch, MD, ABPMR

## 2020-11-04 NOTE — Discharge Instructions (Signed)
Inpatient Rehab Discharge Instructions  ESHAAL DUBY Discharge date and time: No discharge date for patient encounter.   Activities/Precautions/ Functional Status: Activity: activity as tolerated Diet: Dysphagia #1 thin liquids with tube feeds as directed Wound Care: Apply moistened 2x2 daily to abdominal wound daily.  Cover with 2 x 2 paper tape Functional status:  ___ No restrictions     ___ Walk up steps independently ___ 24/7 supervision/assistance   ___ Walk up steps with assistance ___ Intermittent supervision/assistance  ___ Bathe/dress independently ___ Walk with walker     ___ Bathe/dress with assistance ___ Walk Independently    ___ Shower independently ___ Walk with assistance    ___ Shower with assistance ___ No alcohol     ___ Return to work/school ________  Special Instructions: No driving smoking or alcohol    My questions have been answered and I understand these instructions. I will adhere to these goals and the provided educational materials after my discharge from the hospital.  Patient/Caregiver Signature _______________________________ Date __________  Clinician Signature _______________________________________ Date __________  Please bring this form and your medication list with you to all your follow-up doctor's appointments.

## 2020-11-04 NOTE — Progress Notes (Signed)
Pt dentures left in room at [facility].  5E department director will deliver dentures to pt at [facility] on 4.28.22.

## 2020-11-04 NOTE — Consult Note (Addendum)
[facility] Nurse Consult Note: Reason for Consult: Surgical team at bedside to assess wound appearance. Vac removed and midline abd surgical full thickness wounds have decreased in size and depth; 100% red and moist, [id number].[id number] Umbilicus full thickness wound .3X.3X.2cm, red and moist Skin surrounding G-tube with previous maceration is now dry, healed and intact Dressing procedure/placement/frequency: Vac dressing discontinued.  Topical treatment orders provided for bedside nurses to perform as follows: Apply saline moistened 2X2 to abd wound Q day, then cover with dry 2X2 and tape. Apply dry split thickness gauze underneath G tube Q day Apply foam dressing to umbilicus and change Q 3 days or PRN soiling Please re-consult if further assistance is needed.  Thank-you,  Julien Girt MSN, [place], [place], [place], [place]

## 2020-11-04 NOTE — Discharge Summary (Signed)
Physician Discharge Summary  [name] [name] [id number] DOB: 1931/05/28 DOA: 10/23/2020  PCP: Hoyt Koch, MD  Admit date: 10/23/2020 Discharge date: 11/04/2020  Admitted From: Home Disposition: CIR  Recommendations for Outpatient Follow-up:  1. Follow ups as below. 2. Please obtain CBC with diff /CMP/Mag/P in 3 days and one week 3. Please follow up on the following pending results: None   Discharge Condition: Stable CODE STATUS: Full code   Follow-up Information    Dwan Bolt, MD Follow up on 11/16/2020.   Specialty: General Surgery Why:  3:30pm, arrive by 3:15pm Contact information: [place]. [address]   [phone]               Hospital Course: 87 year old F with PMH of UIP/chronic hypoxic RF on 2 L, RA, DVT/PE, CAD, SBO s/p ex lap in [id number] complicated by surgical wound infection presenting with abdominal pain from abdominal wound, odynophagia, poor p.o. intake and generalized weakness and admitted for severe sepsis secondary to abdominal wound infection/cellulitis.  She was treated with broad-spectrum antibiotics but continued to have poor oral intake and odynophagia.  Eventually, she had PEG tube placed and started on continuous tube feed diet was advanced to goal rate of 50 cc an hour. She is being discharged on p.o. Diflucan to complete 14 days course for odynophagia and oral thrush.  Dentistry recommended outpatient follow-up.  Patient is discharged to CIR per therapy recommendation.  General surgery follow-up as above.  See individual problem list below for more on hospital course.  Discharge Diagnoses:  Severe sepsis due to abdominal wound infection/cellulitis -Completed 5 days course of antibiotic with Zosyn and vancomycin and then Bactrim.   -Sepsis physiology resolved. -Wound VAC discontinued.  Wound care as below -On tube feeds to optimize nutrition  Oral thrush/odynophagia: CT maxillofacial showed "Fairly  diffuse mucosal enhancement within the oral cavity. Findings likely reflect sequela of reported thrush/mucositis. The patient is edentulous, although there are several metallic dental prostheses. Left sphenoid sinusitis"  -Discharged on p.o. Diflucan to complete 14 days course -Dentistry recommended outpatient follow-up  Chronic UIP/hypoxic respiratory failure: Patient was on 2 L at baseline but has been saturating in mid 90s on room air here. -Continue home supplemental oxygen -Continue home prednisone and other immunomodulators as below.  Hypokalemia/hypomagnesemia/hypophosphatemia: Resolved. -Recheck in 3 days then in 1 week  Hyponatremia: Improved. -Recheck in 1 week  AKI: Resolved  Generalized weakness/physical deconditioning -Continue PT/OT at CIR  Goal of care: Palliative medicine consulted and evaluated patient. -Remains full code with full scope of care  Thrombocytopenia: Resolved.  Likely erroneous lab Recent Labs  Lab 10/29/20 0508 10/30/20 0510 10/31/20 0513 11/01/20 0510 11/01/20 1107 11/02/20 0538 11/03/20 0521 11/04/20 0506  PLT 223 213 210 96* 230 229 231 254     Normocytic anemia: Stable.  Anemia panel consistent with anemia of chronic disease Recent Labs    10/24/20 0519 10/28/20 0852 10/29/20 0508 10/30/20 0510 10/31/20 0513 11/01/20 0510 11/01/20 1107 11/02/20 0538 11/03/20 0521 11/04/20 0506  HGB 12.0 11.8* 11.5* 10.5* 11.6* 12.0 11.1* 11.1* 10.4* 10.3*  -Optimize nutrition as above   Elevated liver enzymes: Resolved. Recent Labs  Lab 10/31/20 0513 11/01/20 0510 11/02/20 0538 11/03/20 0521 11/04/20 0506  AST 40 48* 34 30 34  ALT 29 32 28 24 26   ALKPHOS 92 85 82 76 73  BILITOT 0.4 0.4 0.4 0.5 0.3  PROT 6.2* 5.9* 6.0* 5.5* 5.6*  ALBUMIN 2.5* 2.2* 2.3* 2.1* 2.2*  Severe protein calorie malnutrition Failure to thrive -PEG tube placed.  Tolerated continuous TF at 50 cc an hour Body mass index is 15.95 kg/m. Nutrition Problem:  Severe Malnutrition Etiology: chronic illness Signs/Symptoms: severe fat depletion,severe muscle depletion,percent weight loss Percent weight loss: 13.5 % Interventions: Tube feeding   Abdominal wound -See wound care instruction as below.  Discharge Exam: Vitals:   11/03/20 2007 11/04/20 0353  BP: (!) 90/53 (!) 87/55  Pulse: 88 78  Resp: 15 15  Temp: 98.8 F (37.1 C) 97.8 F (36.6 C)  SpO2: 94% 96%    GENERAL: No apparent distress.  Nontoxic. HEENT: MMM.  Vision and hearing grossly intact.  NECK: Supple.  No apparent JVD.  RESP: On RA.  No IWOB.  Fair aeration bilaterally. CVS:  RRR. Heart sounds normal.  ABD/GI/GU: Bowel sounds present.  PEG tube in place.  Dressing DCI. MSK/EXT:  Moves extremities.  Significant muscle mass and subcu fat loss. SKIN: Abdominal wound as above NEURO: Awake, alert and oriented appropriately.  No apparent focal neuro deficit. PSYCH: Calm. Normal affect.  Discharge Instructions  Discharge Instructions    Discharge wound care:   Complete by: As directed    Apply saline moistened 2X2 to abd wound Q day, then cover with dry 2X2 and tape. Apply dry split thickness gauze underneath G tube Q day Apply foam dressing to umbilicus and change Q 3 days or PRN soiling     Allergies as of 11/04/2020      Reactions   Crestor [rosuvastatin] Other (See Comments)   Muscle pain   Lactose Intolerance (gi) Other (See Comments)   Stomach upset   Penicillins Hives   Imuran [azathioprine] Nausea And Vomiting      Medication List    STOP taking these medications   acetaminophen 160 MG/5ML solution Commonly known as: TYLENOL Replaced by: acetaminophen 325 MG tablet   cephALEXin 500 MG capsule Commonly known as: KEFLEX   fluconazole 150 MG tablet Commonly known as: DIFLUCAN   furosemide 20 MG tablet Commonly known as: LASIX   HYDROcodone-acetaminophen 10-325 MG tablet Commonly known as: NORCO   potassium chloride 10 MEQ tablet Commonly known  as: KLOR-CON     TAKE these medications   (feeding supplement) PROSource Plus liquid Take 30 mLs by mouth 2 (two) times daily between meals. What changed: Another medication with the same name was changed. Make sure you understand how and when to take each.   feeding supplement (OSMOLITE 1.2 CAL) Liqd Place 50 mL/hr into feeding tube continuous. What changed:   how much to take  how to take this  when to take this  reasons to take this  how fast to infuse this   acetaminophen 325 MG tablet Commonly known as: TYLENOL Take 2 tablets (650 mg total) by mouth every 6 (six) hours as needed for mild pain, fever or headache (or Fever >/= 101). Replaces: acetaminophen 160 MG/5ML solution   famotidine 20 MG tablet Commonly known as: PEPCID Take 1 tablet (20 mg total) by mouth daily.   fluticasone 50 MCG/ACT nasal spray Commonly known as: FLONASE Place 2 sprays into both nostrils daily as needed for allergies or rhinitis.   ILEX SKIN PROTECTANT EX Apply 1 application topically 2 (two) times daily.   lipase/protease/amylase 36000 UNITS Cpep capsule Commonly known as: Creon Take 1 capsule (36,000 Units total) by mouth 3 (three) times daily before meals.   mirtazapine 15 MG disintegrating tablet Commonly known as: REMERON SOL-TAB Take 1 tablet (15  mg total) by mouth at bedtime.   multivitamin with minerals Tabs tablet Take 1 tablet by mouth daily. Start taking on: November 05, 2020   mupirocin ointment 2 % Commonly known as: BACTROBAN Apply 1 application topically 2 (two) times daily.   mycophenolate 500 MG tablet Commonly known as: CellCept 2 tabs bid What changed:   how much to take  how to take this  when to take this  additional instructions   nitroGLYCERIN 0.4 MG SL tablet Commonly known as: NITROSTAT Place 0.4 mg under the tongue every 5 (five) minutes as needed for chest pain.   nitroGLYCERIN 0.4 MG SL tablet Commonly known as: NITROSTAT Place 1 tablet  (0.4 mg total) under the tongue every 5 (five) minutes as needed for chest pain.   Ofev 100 MG Caps Generic drug: Nintedanib Take 1 capsule (100 mg total) by mouth 2 (two) times daily.   ondansetron 4 MG tablet Commonly known as: ZOFRAN Take 1 tablet (4 mg total) by mouth every 8 (eight) hours as needed for nausea.   oxyCODONE 5 MG immediate release tablet Commonly known as: Roxicodone Take 1 tablet (5 mg total) by mouth every 8 (eight) hours as needed.   predniSONE 5 MG tablet Commonly known as: DELTASONE TAKE 1 TABLET DAILY WITH BREAKFAST   REMICADE IV Inject 1 Dose into the vein as directed. Every 2 months            Discharge Care Instructions  (From admission, onward)         Start     Ordered   11/04/20 0000  Discharge wound care:       Comments: Apply saline moistened 2X2 to abd wound Q day, then cover with dry 2X2 and tape. Apply dry split thickness gauze underneath G tube Q day Apply foam dressing to umbilicus and change Q 3 days or PRN soiling   11/04/20 1143          Consultations:  General surgery  Interventional radiology  Palliative medicine  Procedures/Studies:  PEG tube placement   CT ABDOMEN PELVIS WO CONTRAST  Result Date: 10/24/2020 CLINICAL DATA:  Poor appetite. Fungal infection. Worsening weakness. Abdominal wall cellulitis. EXAM: CT ABDOMEN AND PELVIS WITHOUT CONTRAST TECHNIQUE: Multidetector CT imaging of the abdomen and pelvis was performed following the standard protocol without IV contrast. COMPARISON:  10/16/2020 FINDINGS: Lower chest: Interstitial lung disease at the bases, again consistent with usual interstitial pneumonitis. Mild cardiomegaly with trace bilateral pleural fluid. Multivessel coronary artery atherosclerosis. Hepatobiliary: Normal noncontrast appearance of the liver. In addition to lack of IV contrast, arm position degraded exam. The gallbladder is mildly distended, similar to on the prior. No pericholecystic edema or  calcified stone. The common duct measures up to 1.2 cm on 33/4 in the pancreatic head, similar. Pancreas: Not well evaluated.  Grossly within normal limits. Spleen: Normal in size, without focal abnormality. Adrenals/Urinary Tract: Normal adrenal glands. No renal calculi or hydronephrosis. The low pelvis is excluded. No upper bladder stones. Stomach/Bowel: Normal stomach, without wall thickening. Extensive colonic diverticulosis. Normal terminal ileum. The cecum is position within the right deep pelvis. The appendix is likely partially visualized on 65/4 and within normal limits. Normal small bowel caliber. Vascular/Lymphatic: Aortic atherosclerosis. No abdominopelvic adenopathy. Reproductive: Hysterectomy. Other: No abdominal ascites or free intraperitoneal air. Midline anterior abdominal wall defect again identified. Musculoskeletal: Osteopenia. Lumbosacral spondylosis with mild convex left lumbar spine curvature. IMPRESSION: 1. Multifactorial degradation, including lack of IV contrast, arm position, and exclusion of the  low pelvis. 2. Given this limitation, no acute process in the abdomen or pelvis. 3. Interstitial lung disease, consistent with usual interstitial pneumonitis, as before. 4. Trace bilateral pleural fluid. 5. Similar gallbladder distension and common duct dilatation, of indeterminate etiology. Consider correlation with bilirubin levels. 6. Coronary artery atherosclerosis. Aortic Atherosclerosis (ICD10-I70.0). Electronically Signed   By: Abigail Miyamoto M.D.   On: 10/24/2020 17:27   CT HEAD WO CONTRAST  Result Date: 10/24/2020 CLINICAL DATA:  Mental status changes of unknown cause. EXAM: CT HEAD WITHOUT CONTRAST TECHNIQUE: Contiguous axial images were obtained from the base of the skull through the vertex without intravenous contrast. COMPARISON:  01/24/2020 FINDINGS: Brain: There is mild central and cortical atrophy. Periventricular white matter changes are consistent with small vessel disease.  There is no intra or extra-axial fluid collection or mass lesion. The basilar cisterns and ventricles have a normal appearance. There is no CT evidence for acute infarction or hemorrhage. Vascular: There is atherosclerotic calcification of the internal carotid arteries. No hyperdense vessels. Skull: Normal. Negative for fracture or focal lesion. Sinuses/Orbits: Air-fluid level again identified within the LEFT sphenoid air cells. Improved aeration of the ethmoid sinuses. Other: None. IMPRESSION: 1. No evidence for acute intracranial abnormality. 2. Atrophy and small vessel disease. 3. Improved aeration of the ethmoid sinuses. Persistent air-fluid level in the LEFT sphenoid air cells. Electronically Signed   By: Nolon Nations M.D.   On: 10/24/2020 17:14   CT ABDOMEN PELVIS W CONTRAST  Result Date: 10/16/2020 CLINICAL DATA:  abd pain worsening since "abdominal surgery in feb". Also c/o back pain. Abdominal abscess/infection suspected L open abd s/p surgery now w worsening pain. Exploratory laparotomy in February 2022. EXAM: CT ABDOMEN AND PELVIS WITH CONTRAST TECHNIQUE: Multidetector CT imaging of the abdomen and pelvis was performed using the standard protocol following bolus administration of intravenous contrast. CONTRAST:  51mL OMNIPAQUE IOHEXOL 300 MG/ML  SOLN COMPARISON:  CT abdomen pelvis 09/30/2020, CT abdomen pelvis 09/04/2020, CT abdomen pelvis 08/17/2020 FINDINGS: Lower chest: Persistent extensive bibasilar interstitial lung changes consistent with UIP. Hepatobiliary: No focal liver abnormality. No gallstones, gallbladder wall thickening, or pericholecystic fluid. Similar-appearing common bile duct dilatation measuring up to 1.9 cm. No intrahepatic biliary ductal dilatation. Pancreas: No focal lesion. Normal pancreatic contour. No surrounding inflammatory changes. No main pancreatic ductal dilatation. Spleen: Normal in size without focal abnormality. Adrenals/Urinary Tract: No adrenal nodule  bilaterally. Bilateral kidneys enhance symmetrically. No hydronephrosis. No hydroureter. The urinary bladder is unremarkable. Stomach/Bowel: Stomach is within normal limits. No evidence of bowel wall thickening or dilatation. Scattered colonic diverticulosis. Under distended rectum. Status post appendectomy. Vascular/Lymphatic: No abdominal aorta or iliac aneurysm. Moderate atherosclerotic plaque of the aorta and its branches. No abdominal, pelvic, or inguinal lymphadenopathy. Reproductive: Status post hysterectomy. No adnexal masses. Other: No intraperitoneal free fluid. No intraperitoneal free gas. No organized fluid collection. Musculoskeletal: Redemonstration of superficial dehiscence of the laparotomy incision (2:37-60). No suspicious lytic or blastic osseous lesions. No acute displaced fracture. Multilevel degenerative changes of the spine. IMPRESSION: 1. Persistent extensive bibasilar interstitial lung changes consistent with UIP. 2. Persistent common bile duct dilatation. If clinically indicated, consider MRCP and MRI abdomen with contrast for further evaluation. 3. Persistent superficial dehiscence of the laparotomy incision. 4. Scattered colonic diverticulosis with no acute diverticulitis. 5. Aortic Atherosclerosis (ICD10-I70.0). Electronically Signed   By: Iven Finn M.D.   On: 10/16/2020 19:15   IR Replc Gastro/Colonic Tube Percut W/Fluoro  Result Date: 10/30/2020 INDICATION: 87 year old woman status post gastrostomy tube placement in  February, 2022 with subsequent removal presents today measure radiology for Ree insertion 4 supplemental nutrition requirements. EXAM: Gastrostomy tube Ree insertion through pre-existing access. MEDICATIONS: None ANESTHESIA/SEDATION: Versed 1.5 mg IV; Fentanyl 25 mcg IV Moderate Sedation Time:  10 minutes The patient was continuously monitored during the procedure by the interventional radiology nurse under my direct supervision. CONTRAST:  5 mL of Omnipaque  350-administered into the gastric lumen. FLUOROSCOPY TIME:  Fluoroscopy Time: 1 minutes 12 seconds (3 mGy). COMPLICATIONS: None immediate. PROCEDURE: Informed written consent was obtained from the patient after a thorough discussion of the procedural risks, benefits and alternatives. All questions were addressed. Maximal Sterile Barrier Technique was utilized including caps, mask, sterile gowns, sterile gloves, sterile drape, hand hygiene and skin antiseptic. A timeout was performed prior to the initiation of the procedure. Following local ligament striation, the pre-existing opening in the left anterior abdominal wall was accessed with a Kumpe the catheter. Intraluminal positioning of the Kumpe the catheter tip was confirmed by administering contrast under fluoroscopy. Tract dilation was performed and 20 French gastrostomy tube was inserted. Contrast administered through the gastrostomy tube confirmed intraluminal positioning. The retention balloon was inflated with 6 mL of saline. IMPRESSION: Percutaneous gastrostomy tube re-insertion as above. Electronically Signed   By: Miachel Roux M.D.   On: 10/30/2020 08:08   CT MAXILLOFACIAL W CONTRAST  Result Date: 10/29/2020 CLINICAL DATA:  Maxillofacial pain. Additional history provided: Thrush, mouth pain. Mouth pain also related to dental implants screws with irritation. Lip swelling. EXAM: CT MAXILLOFACIAL WITH CONTRAST TECHNIQUE: Multidetector CT imaging of the maxillofacial structures was performed with intravenous contrast. Multiplanar CT image reconstructions were also generated. CONTRAST:  65mL OMNIPAQUE IOHEXOL 300 MG/ML  SOLN COMPARISON:  CT head 10/24/2020. FINDINGS: Osseous: The patient is edentulous. There are several metallic dental prostheses. No acute bony abnormality or aggressive osseous lesion. Orbits: No acute or significant orbital finding. The globes are normal in size and contour. The extraocular muscles and optic nerve sheath complexes are  symmetric and unremarkable. No intraorbital mass or abnormal intraorbital enhancement. Sinuses: Moderate volume frothy secretions and fluid level within the left sphenoid sinus. The paranasal sinuses are otherwise normally aerated. Soft tissues: There is fairly diffuse mucosal enhancement within the oral cavity. Findings likely reflect sequela of reported thrush/mucositis. No appreciable mass within the oral cavity, pharynx or larynx. Limited intracranial: Redemonstrated cerebral atrophy and chronic small vessel ischemic disease. No evidence of acute intracranial abnormality within the field of view. IMPRESSION: Fairly diffuse mucosal enhancement within the oral cavity. Findings likely reflect sequela of reported thrush/mucositis. The patient is edentulous, although there are several metallic dental prostheses. Left sphenoid sinusitis. Electronically Signed   By: Kellie Simmering DO   On: 10/29/2020 13:43   DG Chest Port 1 View  Result Date: 10/23/2020 CLINICAL DATA:  Sepsis, G-tube placed in February 2022, nonhealing wound EXAM: PORTABLE CHEST 1 VIEW COMPARISON:  Portable exam 1609 hours compared to 08/24/2020 FINDINGS: Normal heart size, mediastinal contours, and pulmonary vascularity. Atherosclerotic calcification aorta. Severe pulmonary fibrosis with peripheral and basilar predominance unchanged since an earlier study of 02/12/2020. Underlying emphysematous and bronchitic changes. No definite acute infiltrate, pleural effusion, or pneumothorax. Dextroconvex thoracic scoliosis. IMPRESSION: Changes of COPD and severe pulmonary fibrosis. No acute abnormalities. Aortic Atherosclerosis (ICD10-I70.0) and Emphysema (ICD10-J43.9). Electronically Signed   By: Lavonia Dana M.D.   On: 10/23/2020 16:32       The results of significant diagnostics from this hospitalization (including imaging, microbiology, ancillary and laboratory) are listed below  for reference.     Microbiology: Recent Results (from the past 240  hour(s))  SARS CORONAVIRUS 2 (TAT 6-24 HRS) Nasopharyngeal Nasopharyngeal Swab     Status: None   Collection Time: 10/30/20  1:53 AM   Specimen: Nasopharyngeal Swab  Result Value Ref Range Status   SARS Coronavirus 2 NEGATIVE NEGATIVE Final    Comment: (NOTE) SARS-CoV-2 target nucleic acids are NOT DETECTED.  The SARS-CoV-2 RNA is generally detectable in upper and lower respiratory specimens during the acute phase of infection. Negative results do not preclude SARS-CoV-2 infection, do not rule out co-infections with other pathogens, and should not be used as the sole basis for treatment or other patient management decisions. Negative results must be combined with clinical observations, patient history, and epidemiological information. The expected result is Negative.  Fact Sheet for Patients: SugarRoll.be  Fact Sheet for Healthcare Providers: https://www.woods-mathews.com/  This test is not yet approved or cleared by the [place] FDA and  has been authorized for detection and/or diagnosis of SARS-CoV-2 by FDA under an Emergency Use Authorization (EUA). This EUA will remain  in effect (meaning this test can be used) for the duration of the COVID-19 declaration under Se ction 564(b)(1) of the Act, 21 U.S.C. section 360bbb-3(b)(1), unless the authorization is terminated or revoked sooner.  Performed at [facility], [place] [address] [place], [place] [address]      Labs:  CBC: Recent Labs  Lab 11/01/20 0510 11/01/20 1107 11/02/20 0538 11/03/20 0521 11/04/20 0506  WBC 11.7* 12.3* 11.2* 10.8* 10.8*  NEUTROABS 5.5 6.2 5.2 5.3 4.7  HGB 12.0 11.1* 11.1* 10.4* 10.3*  HCT 37.2 35.1* 35.9* 32.8* 32.3*  MCV 96.1 97.8 100.6* 97.9 99.7  PLT 96* 230 229 231 254   BMP &GFR Recent Labs  Lab 10/31/20 0513 10/31/20 1713 11/01/20 0510 11/02/20 0538 11/03/20 0521 11/04/20 0506  NA 137  --  135 133* 130* 133*  K 3.8  --  4.4  4.5 3.9 4.0  CL 101  --  99 98 99 98  CO2 27  --  27 29 25 27   GLUCOSE 101*  --  87 100* 95 99  BUN 11  --  9 10 9 12   CREATININE 0.54  --  0.47 0.51 0.44 0.50  CALCIUM 8.9  --  9.0 8.7* 8.3* 8.4*  MG 1.8 1.6* 1.6* 1.8 1.7 1.8  PHOS 2.3* 2.4* 2.9 3.3 2.1* 2.7   Estimated Creatinine Clearance: 32.8 mL/min (by C-G formula based on SCr of 0.5 mg/dL). Liver & Pancreas: Recent Labs  Lab 10/31/20 0513 11/01/20 0510 11/02/20 0538 11/03/20 0521 11/04/20 0506  AST 40 48* 34 30 34  ALT 29 32 28 24 26   ALKPHOS 92 85 82 76 73  BILITOT 0.4 0.4 0.4 0.5 0.3  PROT 6.2* 5.9* 6.0* 5.5* 5.6*  ALBUMIN 2.5* 2.2* 2.3* 2.1* 2.2*   No results for input(s): LIPASE, AMYLASE in the last 168 hours. No results for input(s): AMMONIA in the last 168 hours. Diabetic: Recent Labs    11/02/20 0538  HGBA1C 5.5   Recent Labs  Lab 11/03/20 1629 11/03/20 2008 11/03/20 2328 11/04/20 0354 11/04/20 0755  GLUCAP 177* 130* 163* 127* 93   Cardiac Enzymes: No results for input(s): CKTOTAL, CKMB, CKMBINDEX, TROPONINI in the last 168 hours. Recent Labs    06/03/20 1001 09/29/20 1425  PROBNP 100 58.0   Coagulation Profile: No results for input(s): INR, PROTIME in the last 168 hours. Thyroid Function Tests: No results  for input(s): TSH, T4TOTAL, FREET4, T3FREE, THYROIDAB in the last 72 hours. Lipid Profile: No results for input(s): CHOL, HDL, LDLCALC, TRIG, CHOLHDL, LDLDIRECT in the last 72 hours. Anemia Panel: Recent Labs    11/03/20 0521  VITAMINB12 711  FOLATE 9.7  FERRITIN 970*  TIBC 180*  IRON 25*  RETICCTPCT 2.3   Urine analysis:    Component Value Date/Time   COLORURINE YELLOW 10/16/2020 2145   APPEARANCEUR CLEAR 10/16/2020 2145   LABSPEC >1.046 (H) 10/16/2020 2145   PHURINE 5.0 10/16/2020 2145   GLUCOSEU NEGATIVE 10/16/2020 2145   GLUCOSEU NEGATIVE 01/03/2020 1434   HGBUR SMALL (A) 10/16/2020 2145   BILIRUBINUR NEGATIVE 10/16/2020 2145   BILIRUBINUR N 01/24/2014 1354    KETONESUR 5 (A) 10/16/2020 2145   PROTEINUR NEGATIVE 10/16/2020 2145   UROBILINOGEN 4.0 (A) 01/03/2020 1434   NITRITE NEGATIVE 10/16/2020 2145   LEUKOCYTESUR NEGATIVE 10/16/2020 2145   Sepsis Labs: Invalid input(s): PROCALCITONIN, LACTICIDVEN   Time coordinating discharge: 45 minutes  SIGNED:  Mercy Riding, MD  [facility] 11/04/2020, 11:59 AM  If 7PM-7AM, please contact night-coverage [email or web address]

## 2020-11-04 NOTE — Progress Notes (Signed)
Calorie Count Note  48 hour calorie count ordered.  Diet: dysphagia 1  Supplements:  -Prosource Plus PO once/day, each provides 100 kcals and 15g protein -Boost Breeze po TID, each supplement provides 250 kcal and 9 grams of protein   *Noted sign on door for calorie count but no documentation to be found in room, on door or in flowsheets for 4/26. Pt sleeping at time of visit. Tube feeding now at goal rate of 50 ml/hr so meeting needs. Noted pt to discharge to CIR today.  Nutrition Dx: Severe Malnutrition related to chronic illness as evidenced by severe fat depletion,severe muscle depletion,percent weight loss.  Goal: Pt to meet >/= 90% of their estimated nutrition needs    Intervention:  -Continue Osmolite 1.2 @ 50 ml/hr -100 ml free water QID -30 ml Prosource Plus once/day PO or via tube -Boost Breeze po TID, each supplement provides 250 kcal and 9 grams of protein -Bolus feed recommendations in follow-up note 4/26  Clayton Bibles, MS, RD, LDN Inpatient Clinical Dietitian Contact information available via Amion

## 2020-11-05 ENCOUNTER — Inpatient Hospital Stay (HOSPITAL_COMMUNITY): Payer: Medicare Other

## 2020-11-05 HISTORY — PX: IR GJ TUBE CHANGE: IMG1440

## 2020-11-05 LAB — CBC WITH DIFFERENTIAL/PLATELET
Abs Immature Granulocytes: 0.08 10*3/uL — ABNORMAL HIGH (ref 0.00–0.07)
Basophils Absolute: 0.1 10*3/uL (ref 0.0–0.1)
Basophils Relative: 1 %
Eosinophils Absolute: 0.1 10*3/uL (ref 0.0–0.5)
Eosinophils Relative: 1 %
HCT: 34.8 % — ABNORMAL LOW (ref 36.0–46.0)
Hemoglobin: 11.3 g/dL — ABNORMAL LOW (ref 12.0–15.0)
Immature Granulocytes: 1 %
Lymphocytes Relative: 45 %
Lymphs Abs: 5.2 10*3/uL — ABNORMAL HIGH (ref 0.7–4.0)
MCH: 31 pg (ref 26.0–34.0)
MCHC: 32.5 g/dL (ref 30.0–36.0)
MCV: 95.6 fL (ref 80.0–100.0)
Monocytes Absolute: 1.2 10*3/uL — ABNORMAL HIGH (ref 0.1–1.0)
Monocytes Relative: 11 %
Neutro Abs: 4.5 10*3/uL (ref 1.7–7.7)
Neutrophils Relative %: 41 %
Platelets: 332 10*3/uL (ref 150–400)
RBC: 3.64 MIL/uL — ABNORMAL LOW (ref 3.87–5.11)
RDW: 20.2 % — ABNORMAL HIGH (ref 11.5–15.5)
WBC: 11.1 10*3/uL — ABNORMAL HIGH (ref 4.0–10.5)
nRBC: 0 % (ref 0.0–0.2)

## 2020-11-05 LAB — GLUCOSE, CAPILLARY
Glucose-Capillary: 106 mg/dL — ABNORMAL HIGH (ref 70–99)
Glucose-Capillary: 126 mg/dL — ABNORMAL HIGH (ref 70–99)
Glucose-Capillary: 90 mg/dL (ref 70–99)
Glucose-Capillary: 122 mg/dL — ABNORMAL HIGH (ref 70–99)
Glucose-Capillary: 168 mg/dL — ABNORMAL HIGH (ref 70–99)

## 2020-11-05 LAB — COMPREHENSIVE METABOLIC PANEL
ALT: 32 U/L (ref 0–44)
AST: 42 U/L — ABNORMAL HIGH (ref 15–41)
Albumin: 2.2 g/dL — ABNORMAL LOW (ref 3.5–5.0)
Alkaline Phosphatase: 91 U/L (ref 38–126)
Anion gap: 7 (ref 5–15)
BUN: 8 mg/dL (ref 8–23)
CO2: 28 mmol/L (ref 22–32)
Calcium: 8.8 mg/dL — ABNORMAL LOW (ref 8.9–10.3)
Chloride: 98 mmol/L (ref 98–111)
Creatinine, Ser: 0.54 mg/dL (ref 0.44–1.00)
GFR, Estimated: >60 mL/min (ref >60)
Glucose, Bld: 102 mg/dL — ABNORMAL HIGH (ref 70–99)
Potassium: 4.1 mmol/L (ref 3.5–5.1)
Sodium: 133 mmol/L — ABNORMAL LOW (ref 135–145)
Total Bilirubin: 0.4 mg/dL (ref 0.3–1.2)
Total Protein: 6 g/dL — ABNORMAL LOW (ref 6.5–8.1)

## 2020-11-05 MED ORDER — OSMOLITE 1.5 CAL PO LIQD
840.0000 mL | ORAL | Status: DC
  Administered 2020-11-05 – 2020-11-15 (×12): 840 mL
  Filled 2020-11-05: qty 1000
  Filled 2020-11-05 (×3): qty 948

## 2020-11-05 MED ORDER — BOOST / RESOURCE BREEZE PO LIQD CUSTOM
1.0000 | Freq: Two times a day (BID) | ORAL | Status: DC
  Administered 2020-11-05 – 2020-11-23 (×32): 1 via ORAL

## 2020-11-05 MED ORDER — PROSOURCE TF PO LIQD
45.0000 mL | Freq: Every day | ORAL | Status: DC
  Administered 2020-11-06 – 2020-11-23 (×15): 45 mL
  Filled 2020-11-05 (×18): qty 45

## 2020-11-05 MED ORDER — TRAMADOL HCL 50 MG PO TABS
50.0000 mg | ORAL_TABLET | Freq: Two times a day (BID) | ORAL | Status: DC | PRN
  Administered 2020-11-06: 50 mg via ORAL
  Filled 2020-11-05: qty 1

## 2020-11-05 MED ORDER — MYCOPHENOLATE 200 MG/ML ORAL SUSPENSION
1000.0000 mg | Freq: Two times a day (BID) | ORAL | Status: DC
  Administered 2020-11-05 – 2020-11-12 (×13): 1000 mg
  Filled 2020-11-05 (×20): qty 20

## 2020-11-05 MED ORDER — BENZOCAINE 10 % MT GEL
OROMUCOSAL | Status: DC | PRN
  Administered 2020-11-21: 1 via OROMUCOSAL
  Filled 2020-11-05: qty 9

## 2020-11-05 MED ORDER — OSMOLITE 1.2 CAL PO LIQD: 1000.0000 mL | ORAL | Status: DC

## 2020-11-05 MED ORDER — ENSURE ENLIVE PO LIQD
237.0000 mL | ORAL | Status: DC
  Administered 2020-11-05 – 2020-11-20 (×16): 237 mL via ORAL
  Filled 2020-11-05: qty 237

## 2020-11-05 MED ORDER — IOHEXOL 300 MG/ML  SOLN
50.0000 mL | Freq: Once | INTRAMUSCULAR | Status: AC | PRN
  Administered 2020-11-05: 20 mL

## 2020-11-05 MED ORDER — LIDOCAINE VISCOUS HCL 2 % MT SOLN
OROMUCOSAL | Status: AC
  Administered 2020-11-05: 2 mL via GASTROSTOMY
  Filled 2020-11-05: qty 15

## 2020-11-05 MED ORDER — IOHEXOL 300 MG/ML  SOLN
50.0000 mL | Freq: Once | INTRAMUSCULAR | Status: AC | PRN
  Administered 2020-11-05: 30 mL via INTRATHECAL

## 2020-11-05 NOTE — Progress Notes (Signed)
Inpatient Rehabilitation Care Coordinator Assessment and Plan Patient Details  Name: [name] [name] MRN: [id number] Date of Birth: 09-20-1930  Today's Date: 11/05/2020  Hospital Problems: Principal Problem:   Debility  Past Medical History:  Past Medical History:  Diagnosis Date  . Allergic rhinitis   . Allergy   . CAD (coronary artery disease)    Mild CAD by cath 2008  . CHF (congestive heart failure) ([place])   . DJD (degenerative joint disease)    rheumatoid  . DVT (deep venous thrombosis) ([place])   . Dyslipidemia   . History of echocardiogram    Echo 12/2018: EF 60-65, mod asymmetric LVH, Gr 1 DD  . History of nuclear stress test    Myoview 5/17:  EF 74%, normal perfusion, low risk // Myoview 12/2018:  EF 89, very mild ischemia in inferoapical wall; Low Risk  . History of pulmonary embolism   . Hyperlipidemia   . Insomnia   . Osteoporosis   . Psoriasis   . Pulmonary fibrosis ([place])   . Rheumatoid arthritis [facility]    Past Surgical History:  Past Surgical History:  Procedure Laterality Date  . ABDOMINAL HYSTERECTOMY  1974  . APPENDECTOMY  1959  . CATARACT EXTRACTION    . IR REPLC GASTRO/COLONIC TUBE PERCUT W/FLUORO  10/29/2020  . LAPAROTOMY N/A 08/23/2020   Procedure: EXPLORATORY LAPAROTOMY small bowel resection gastrostomy tube placement;  Surgeon: Dwan Bolt, MD;  Location: [facility] ORS;  Service: General;  Laterality: N/A;  . LEFT HEART CATHETERIZATION WITH CORONARY ANGIOGRAM N/A 06/18/2013   Procedure: LEFT HEART CATHETERIZATION WITH CORONARY ANGIOGRAM;  Surgeon: Peter M Martinique, MD;  Location: [facility] CATH LAB;  Service: Cardiovascular;  Laterality: N/A;  . TONSILLECTOMY  1941, 1951  . TOTAL KNEE ARTHROPLASTY  1997, 2007   Bilateral   Social History:  reports that she quit smoking about 47 years ago. Her smoking use included cigarettes. She has a 10.00 pack-year smoking history. She has never used smokeless tobacco. She reports that she does not drink alcohol and does not use  drugs.  Family / Support Systems Marital Status: Widow/Widower Patient Roles: Volunteer,Other (Comment) (Board member-Philanthropist) Other Supports: [name]-sister in-law/POA [phone]  [phone]  Haze Boyden [phone] Anticipated Caregiver: need to come up with a plan-was at [facility] Ability/Limitations of Caregiver: Currently can return to [facility] but may need more care than this level provides Caregiver Availability: Other (Comment) (Sister in-law and family to come up with a plan when know level of care needed) Family Dynamics: Close with nieces, nephews, sister in-law and friends. She has a personal assistant-Cammie who runs errands and can do some for her  Social History Preferred language: English Religion: Protestant Cultural Background: No issues Education: Colleged educated Read: Yes Write: Yes Employment Status: Retired Public relations account executive Issues: No issues Guardian/Conservator: None-according to MD pt is capable of making her own decisions while here. [name] sister in-law is her POA will include her in any decisions while here per pt's request   Abuse/Neglect Abuse/Neglect Assessment Can Be Completed: Yes Physical Abuse: Denies Verbal Abuse: Denies Sexual Abuse: Denies Exploitation of patient/patient's resources: Denies Self-Neglect: Denies  Emotional Status Pt's affect, behavior and adjustment status: Pt wants to get stronger and be able to do as much as she can for herself, she is not sure if she wants to go back to [place] or her home. She is having a rough day today but feels it will get better from here. She is very involved in her carre and any  decision needing to be made Recent Psychosocial Issues: other health issues Psychiatric History: No history deferred depression screen due to adjusting to rehab but do feel with multiple hospitalizations and medical issues she would benefit from seeing neruo-psych while  here Substance Abuse History: No issues  Patient / Family Perceptions, Expectations & Goals Pt/Family understanding of illness & functional limitations: Pt and sister in-law can explain her medical issues and procedures along with wounds in her mouth. Both talk with the MD's involved and feel have a good understanding of what her plan is going forward. Premorbid pt/family roles/activities: Sister in-law, widow, board member, Regulatory affairs officer, church member, aunt, etc Anticipated changes in roles/activities/participation: resume Pt/family expectations/goals: Pt states: " I want to do well here and was told would be here two weeks."  Sister in-law states: " We will work together on what she needs and a care plan for her care when she is discharged."  [employer]: Other (Comment) ([facility] prior to admission) Premorbid Home Care/DME Agencies: Other (Comment) (cane, rw, bsc) Transportation available at discharge: Multiple people-assistant Resource referrals recommended: Neuropsychology  Discharge Planning Living Arrangements: Other (Comment) (ALF) Support Systems: Other relatives,Friends/neighbors,Church/faith community Type of Residence: [place] Name: [place]: [employer] (specify) Sports administrator) Financial Resources: Scientist, physiological (Comment) Financial Screen Referred: No Living Expenses: Other (Comment) (living in ALF prior to admission) Does the patient have any problems obtaining your medications?: No Home Management: Hired assist-in facility PTA Patient/Family Preliminary Plans: Unsure what the current plan is to return to [place] or home with 24/7. Pt, family and friends will need to come up with the best plan for pt. She will ultimately decide where she is going. will await therapy evaluations and work on her DC needs. Care Coordinator Barriers to Discharge: Wound Care,Other (comments) Care  Coordinator Anticipated Follow Up Needs: HH/OP,ALF/IL  Clinical Impression Pleasant severely ill female who has been hospitalized multiiple time is the past two months. She was living at [facility] prior to admission but they were having difficulty managing her wounds. She has good support via sister in-law and nephew along with her assistant and friends. Palliative Care was involved at [facility] and pt decided she wants to be aggressive in her care. She will need 24/7 care at discharge will need to come up with a plan with her family for this. Will await therapy evaluations and work on best plan for her. Will have neuro-psych see while here  Elease Hashimoto 11/05/2020, 1:23 PM

## 2020-11-05 NOTE — Progress Notes (Signed)
Orthopedic Tech Progress Note Patient Details:  [name] [name] June 20, 1931 [id number] Gave ab. Binder to RN Ortho Devices Type of Ortho Device: Abdominal binder Ortho Device/Splint Interventions: Ordered       [name] [name] 11/05/2020, 10:51 AM

## 2020-11-05 NOTE — Progress Notes (Signed)
Initial Nutrition Assessment  DOCUMENTATION CODES:   Underweight,Severe malnutrition in context of chronic illness  INTERVENTION:   Transition to nocturnal tube feeds via G-tube: - Osmolite 1.5 @ 70 ml/hr to run over 12 hours (1800 to 0600) - ProSource TF 45 ml daily  Nocturnal tube feeding regimen provides 1300 kcal, 64 grams of protein, and 640 ml of H2O (meets 87% of kcal needs and 91% of protein needs).  - Boost Breeze po BID with breakfast and lunch meals, each supplement provides 250 kcal and 9 grams of protein  - Ensure Enlive po daily with dinner meal, each supplement provides 350 kcal and 20 grams of protein  - Continue MVI with minerals daily  NUTRITION DIAGNOSIS:   Severe Malnutrition related to chronic illness as evidenced by severe fat depletion,severe muscle depletion,percent weight loss (23.7% weight loss in less than 4 months).  GOAL:   Patient will meet greater than or equal to 90% of their needs  MONITOR:   PO intake,Supplement acceptance,Weight trends,TF tolerance,Skin  REASON FOR ASSESSMENT:   Malnutrition Screening Tool,Consult Enteral/tube feeding initiation and management  ASSESSMENT:   87 year old female with PMH of chronic hypoxic respiratory failure, interstitial pneumonia, pulmonary fibrosis on 2 L oxygen, rheumatoid arthritis, DVT/PE, SBO s/p ex-lap in February 2022 with G-tube placement 08/23/20 that has since been removed. Pt presented on 10/23/20 with increasing abdominal pain and some wound drainage from prior G-tube, AMS, right foot drop. Due to patient's poor PO intake and ongoing significant dysphagia, IR consulted and G-tube was placed 10/29/20. Admitted to CIR on 4/27.   Discussed pt with RN and MD prior to speaking with pt at bedside. Per MD, okay to transition pt to nocturnal tube feeds to promote PO intake. Per RN, pt's PEG tube fell out today and it was replaced with a Foley tube. IR has been consulted to replace PEG tube.  Noted  Palliative Care consult in place. Pt was seen by Palliative Care during acute admission and pt decided on full code/full scope and to continue any and all aggressive interventions.  Spoke with pt at bedside. Pt reports appetite is okay. She states that it hurts to eat at times and then pulled down her lower lip to show me one of her bottom teeth/implant that appeared to be quite sharp. Pt reports that her tooth/implant is "cutting into" her gum. Noted pt with diflucan and magic mouthwash currently ordered. Per H&P, pt did have some thrush and prior CT maxillofacial showed "fairly diffuse mucosal enhancement within the oral cavity likely reflecting sequela of reported thrush/mucositis." Dentist saw pt during acute admission and "felt her lower lip was sinking into her lower implants causing some ulceration of both sides of her inner lip." Orajel is ordered PRN. Suspect all of the above is severely impacting pt's PO intake.  Pt states that she prefers Boost Breeze over Ensure but is willing to consume Ensure in addition to [employer] since it is higher in protein. Pt also amenable to transitioning over to nocturnal tube feeds. RD will ensure that nocturnal tube feeds meet ~90% of estimated kcal and protein needs as highly suspect PO intake will be limited or variable.  Pt reports that her UBW prior to any weight loss was 138-140 lbs. She reports that the majority of her weight loss has occurred over the last 1.5 years. Reviewed weight history in chart. Pt with a weight loss of 11.2 kg since 07/21/20. This is a 23.7% weight loss in less than 4 months which  is severe and significant for timeframe. Pt is severely malnourished.  Meal Completion: 25-50%  Medications reviewed and include: ProSource Plus daily, pepcid, Boost Breeze TID, diflucan, SSI q 4 hours, creon 36,000 units TID, magic mouthwash, remeron, MVI with minerals, prednisone  Labs reviewed: sodium 133 CBG's: 106-134 x 24 hours  NUTRITION -  FOCUSED PHYSICAL EXAM:  Flowsheet Row Most Recent Value  Orbital Region Severe depletion  Upper Arm Region Severe depletion  Thoracic and Lumbar Region Severe depletion  Buccal Region Severe depletion  Temple Region Severe depletion  Clavicle Bone Region Severe depletion  Clavicle and Acromion Bone Region Severe depletion  Scapular Bone Region Severe depletion  Dorsal Hand Severe depletion  Patellar Region Severe depletion  Anterior Thigh Region Severe depletion  Posterior Calf Region Severe depletion  Edema (RD Assessment) None  Hair Reviewed  Eyes Reviewed  Mouth Reviewed  Skin Reviewed  [dry]  Nails Reviewed  [thick, yellow]       Diet Order:   Diet Order            DIET - DYS 1 Room service appropriate? Yes; Fluid consistency: Thin  Diet effective now                 EDUCATION NEEDS:   No education needs have been identified at this time  Skin:  Skin Assessment: Skin Integrity Issues: Stage I: lumbar Stage II: L buttocks Stage III: sacrum Incisions: abdomen (old G-tube site)  Last BM:  11/02/20  Height:   Ht Readings from Last 1 Encounters:  11/04/20 5\' 4"  (1.626 m)    Weight:   Wt Readings from Last 1 Encounters:  11/05/20 36 kg    BMI:  Body mass index is 13.62 kg/m.  Estimated Nutritional Needs:   Kcal:  1500-1700  Protein:  70-85 grams  Fluid:  1.5 L/day    Gustavus Bryant, MS, RD, LDN Inpatient Clinical Dietitian Please see AMiON for contact information.

## 2020-11-05 NOTE — Progress Notes (Signed)
[facility] Individual Statement of Services  Patient Name:  [name] [name]  Date:  11/05/2020  Welcome to the [facility].  Our goal is to provide you with an individualized program based on your diagnosis and situation, designed to meet your specific needs.  With this comprehensive rehabilitation program, you will be expected to participate in at least 3 hours of rehabilitation therapies Monday-Friday, with modified therapy programming on the weekends.  Your rehabilitation program will include the following services:  Physical Therapy (PT), Occupational Therapy (OT), Speech Therapy (ST), 24 hour per day rehabilitation nursing, Neuropsychology, Care Coordinator, Rehabilitation Medicine, Nutrition Services and Pharmacy Services  Weekly team conferences will be held on Tuesday to discuss your progress.  Your Inpatient Rehabilitation Care Coordinator will talk with you frequently to get your input and to update you on team discussions.  Team conferences with you and your family in attendance may also be held.  Expected length of stay: 2-2.5 weeks  Overall anticipated outcome: min level  Depending on your progress and recovery, your program may change. Your Inpatient Rehabilitation Care Coordinator will coordinate services and will keep you informed of any changes. Your Inpatient Rehabilitation Care Coordinator's name and contact numbers are listed  below.  The following services may also be recommended but are not provided by the [facility] will be made to provide these services after discharge if needed.  Arrangements include referral to agencies that provide these services.  Your insurance has been verified to be: Medicare & Tricare Your primary doctor is:  Pricilla Holm  Pertinent information will be shared with  your doctor and your insurance company.  Inpatient Rehabilitation Care Coordinator:  Ovidio Kin, Wallace or Emilia Beck  Information discussed with and copy given to patient by: Elease Hashimoto, 11/05/2020, 1:27 PM

## 2020-11-05 NOTE — Progress Notes (Signed)
Occupational Therapy Note  Patient Details  Name: [name] [name] MRN: [id number] Date of Birth: 08-13-1930   Pt greeted at time of session side lying in bed on R side pleasant and cooperative but limited communication d/t pain with speaking (nursing and SLP aware). Began eval with questions regarding home set up, PLOF, DC planning, BIMS, etc. After items retrieved for ADL for evaluation, after lifting covers patient's PEG tube noted to be disconnected from abdomen and RN notified. Terminated eval and pt missed 60 minutes of OT.    Viona Gilmore 11/05/2020, 5:03 PM

## 2020-11-05 NOTE — Evaluation (Signed)
Speech Language Pathology Assessment and Plan  Patient Details  Name: [name] [name] MRN: [id number] Date of Birth: 04/22/1931  SLP Diagnosis: Cognitive Impairments  Rehab Potential: Good ELOS: 2 weeks    Today's Date: 11/05/2020 SLP Individual Time: 0800-0830 SLP Individual Time Calculation (min): 30 min   Hospital Problem: Principal Problem:   Debility  Past Medical History:  Past Medical History:  Diagnosis Date  . Allergic rhinitis   . Allergy   . CAD (coronary artery disease)    Mild CAD by cath 2008  . CHF (congestive heart failure) (HCC)   . DJD (degenerative joint disease)    rheumatoid  . DVT (deep venous thrombosis) (HCC)   . Dyslipidemia   . History of echocardiogram    Echo 12/2018: EF 60-65, mod asymmetric LVH, Gr 1 DD  . History of nuclear stress test    Myoview 5/17:  EF 74%, normal perfusion, low risk // Myoview 12/2018:  EF 89, very mild ischemia in inferoapical wall; Low Risk  . History of pulmonary embolism   . Hyperlipidemia   . Insomnia   . Osteoporosis   . Psoriasis   . Pulmonary fibrosis (HCC)   . Rheumatoid arthritis (HCC)    Past Surgical History:  Past Surgical History:  Procedure Laterality Date  . ABDOMINAL HYSTERECTOMY  1974  . APPENDECTOMY  1959  . CATARACT EXTRACTION    . IR REPLC GASTRO/COLONIC TUBE PERCUT W/FLUORO  10/29/2020  . LAPAROTOMY N/A 08/23/2020   Procedure: EXPLORATORY LAPAROTOMY small bowel resection gastrostomy tube placement;  Surgeon: Allen, Shelby L, MD;  Location: [facility];  Service: General;  Laterality: N/A;  . LEFT HEART CATHETERIZATION WITH CORONARY ANGIOGRAM N/A 06/18/2013   Procedure: LEFT HEART CATHETERIZATION WITH CORONARY ANGIOGRAM;  Surgeon: Peter M Jordan, MD;  Location: [facility] CATH [facility];  Service: Cardiovascular;  Laterality: N/A;  . TONSILLECTOMY  1941, 1951  . TOTAL KNEE ARTHROPLASTY  1997, 2007   Bilateral    Assessment / Plan / Recommendation [name] [name] is an 89-year-old right-handed female with history  of chronic hypoxic respiratory failure /interstitial pneumonia/pulmonary fibrosis on 2 L oxygen, chronic prednisone/CellCept for rheumatoid arthritis, history of DVT/PE, SBO status post exploratory laparotomy February 2022 with G-tube placement 08/23/2020 that is since been removed.  History taken from chart review and patient. Per chart review patient lives alone.  1 level home 2 steps to entry.  Patient reportedly independent until 2 months ago she now has an aide a few hours a day 2 days a week.  She has had a couple of falls in the last couple of weeks.  She presented on 10/23/2020 with increasing abdominal pain and some wound drainage from prior G-tube and some AMS as well as right foot drop.  She was admitted a few weeks ago in February with complications from her exploratory laparotomy requiring TPN and was discharged to a skilled nursing facility for short time.  Admission chemistry sodium 146, potassium 5/3, WBC 11,400, hemoglobin 11.9, lactic acid 2.7-3.4, blood cultures no growth to date, COVID-negative.  CT of abdomen pelvis showed no acute process in the abdomen or pelvis.  Cranial CT scan unremarkable for acute intracranial process. General surgery consulted initially with wound VAC later discontinued changed to wet-to-dry dressings..  Palliative care consulted for goals of care.  Due to patient's poor p.o. intake interventional radiology consulted as well as ongoing significant dysphagia, G-tube was placed 10/29/2020 per interventional radiology.  Patient did have some thrush and mouth pain CT maxillofacial showed   fairly diffuse mucosal enhancement within the oral cavity likely reflecting sequela of reported thrush/mucositis.  Dr.Owsley consulted and felt her lower lip was sinking into her lower implants causing some ulceration of both sides of her inner lip and maintained on chlorhexidine.  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 Maintained on Lovenox for DVT  prophylaxis.  Therapy evaluations completed due to patient's debility was admitted for a comprehensive rehab program. Please see preadmission assessment from earlier today as well.    Clinical Impression Patient presents with a questionable mild cognitive impairment however complete speech-language and cognitive evaluation was impacted by patient's fatigue as well as c/o significant amount of oral pain which she reports causes pain when talking and eating. She stated that she did try to eat a little breakfast but was not able to eat much at all because of pain. Patient appears to be aware and oriented at basic level at least and plan is for full cognitive evaluation as well as BSE evaluation when patient adequately alert.  Skilled Therapeutic Interventions          Speech-language evaluation  SLP Assessment  Patient will need skilled Speech Lanaguage Pathology Services during CIR admission    Recommendations  SLP Diet Recommendations: Dysphagia 1 (Puree);Thin Liquid Administration via: Cup;Straw Medication Administration: Whole meds with puree Compensations: Slow rate;Small sips/bites Oral Care Recommendations: Oral care BID Recommendations for Other Services: Neuropsych consult Patient destination: Home Follow up Recommendations: Other (comment) (TBD) Equipment Recommended: None recommended by SLP    SLP Frequency 3 to 5 out of 7 days   SLP Duration  SLP Intensity  SLP Treatment/Interventions 2 weeks  Minumum of 1-2 x/day, 30 to 90 minutes  Cognitive remediation/compensation;Dysphagia/aspiration precaution training;Patient/family education         Pain Pain Assessment Pain Scale: Faces Faces Pain Scale: Hurts little more Pain Type: Acute pain Pain Location: Mouth Pain Orientation: Anterior Pain Descriptors / Indicators: Aching;Discomfort Pain Onset: On-going Multiple Pain Sites: Yes 2nd Pain Site Wong-Baker Pain Rating: Hurts a little bit Pain Type: Acute pain Pain  Location: Knee Pain Descriptors / Indicators: Cramping;Aching Pain Onset: On-going Pain Intervention(s): Repositioned  Prior Functioning Cognitive/Linguistic Baseline: Information not available Type of Home: House Available Help at Discharge: Family;Available 24 hours/day;Other (Comment)  SLP Evaluation Cognition Overall Cognitive Status: Difficult to assess Arousal/Alertness: Lethargic Orientation Level: Oriented to person;Oriented to place;Oriented to situation Awareness: Appears intact Safety/Judgment: Appears intact  Comprehension Auditory Comprehension Overall Auditory Comprehension: Appears within functional limits for tasks assessed Expression Expression Primary Mode of Expression: Verbal Verbal Expression Overall Verbal Expression: Appears within functional limits for tasks assessed Oral Motor Oral Motor/Sensory Function Overall Oral Motor/Sensory Function: Other (comment) (appears WFL but will further assess when patient more alert) Motor Speech Overall Motor Speech: Appears within functional limits for tasks assessed  Care Tool Care Tool Cognition Expression of Ideas and Wants Expression of Ideas and Wants: Some difficulty - exhibits some difficulty with expressing needs and ideas (e.g, some words or finishing thoughts) or speech is not clear   Understanding Verbal and Non-Verbal Content Understanding Verbal and Non-Verbal Content: Usually understands - understands most conversations, but misses some part/intent of message. Requires cues at times to understand   Memory/Recall Ability *first 3 days only Memory/Recall Ability *first 3 days only: Current season;That he or she is in a hospital/hospital unit     Short Term Goals: Week 1: SLP Short Term Goal 1 (Week 1): Patient will demonstrated toleration of current diet Dys   1 puree solids and thin liquids without overt s/s aspiration or penetration. SLP Short Term Goal 2 (Week 1): Patient will tolerate trials of  upgraded solids with adequate mastication and oral phase of swallow with supervision A. SLP Short Term Goal 3 (Week 1): Patient will participate in cognitive-linguistic and speech evaluation to determine need for development of tx goals.  Refer to Care Plan for Long Term Goals  Recommendations for other services: Neuropsych  Discharge Criteria: Patient will be discharged from SLP if patient refuses treatment 3 consecutive times without medical reason, if treatment goals not met, if there is a change in medical status, if patient makes no progress towards goals or if patient is discharged from hospital.  The above assessment, treatment plan, treatment alternatives and goals were discussed and mutually agreed upon: by patient    T. , MA, CCC-SLP Speech Therapy 

## 2020-11-05 NOTE — Progress Notes (Signed)
Patient information reviewed and entered into [facility] by Becky Thorne Wirz, PPS coordinator. Information including medical coding, function ability, and quality indicators will be reviewed and updated through discharge.   

## 2020-11-05 NOTE — Progress Notes (Signed)
Physical Therapy Note  Patient Details  Name: [name] [name] MRN: [id number] Date of Birth: 09/22/1930 Today's Date: 11/05/2020    PT attempted to see pt for initial evaluation however per pt's nursing pt inappropriate for therapy evaluation at this time as she is currently awaiting decisions for PEG tube replacement. Pt missed 75 mins of PT.     Junie Panning 11/05/2020, 1:43 PM

## 2020-11-06 DIAGNOSIS — R5381 Other malaise: Principal | ICD-10-CM

## 2020-11-06 LAB — GLUCOSE, CAPILLARY
Glucose-Capillary: 105 mg/dL — ABNORMAL HIGH (ref 70–99)
Glucose-Capillary: 128 mg/dL — ABNORMAL HIGH (ref 70–99)
Glucose-Capillary: 142 mg/dL — ABNORMAL HIGH (ref 70–99)
Glucose-Capillary: 149 mg/dL — ABNORMAL HIGH (ref 70–99)

## 2020-11-06 MED ORDER — ENOXAPARIN SODIUM 300 MG/3ML IJ SOLN
20.0000 mg | INTRAMUSCULAR | Status: DC
  Administered 2020-11-07 – 2020-11-23 (×17): 20 mg via SUBCUTANEOUS
  Filled 2020-11-06 (×19): qty 0.2

## 2020-11-06 MED ORDER — HYDROCODONE-ACETAMINOPHEN 5-325 MG PO TABS
1.0000 | ORAL_TABLET | ORAL | Status: DC | PRN
Start: 2020-11-06 — End: 2020-11-08
  Administered 2020-11-06 – 2020-11-07 (×4): 1 via ORAL
  Filled 2020-11-06 (×4): qty 1

## 2020-11-06 MED ORDER — MIDODRINE HCL 5 MG PO TABS
2.5000 mg | ORAL_TABLET | Freq: Three times a day (TID) | ORAL | Status: DC
  Administered 2020-11-06 – 2020-11-11 (×14): 2.5 mg via ORAL
  Filled 2020-11-06 (×14): qty 1

## 2020-11-06 NOTE — Evaluation (Signed)
Speech Language Pathology Bedside Swallow Evaluation  Patient Details  Name: [name] [name] MRN: [id number] Date of Birth: 01-03-1931   Today's Date: 11/06/2020 SLP Individual Time: 1300-1330 SLP Individual Time Calculation (min): 30 min   Hospital Problem: Principal Problem:   Debility  Past Medical History:  Past Medical History:  Diagnosis Date  . Allergic rhinitis   . Allergy   . CAD (coronary artery disease)    Mild CAD by cath 2008  . CHF (congestive heart failure) ([place])   . DJD (degenerative joint disease)    rheumatoid  . DVT (deep venous thrombosis) ([place])   . Dyslipidemia   . History of echocardiogram    Echo 12/2018: EF 60-65, mod asymmetric LVH, Gr 1 DD  . History of nuclear stress test    Myoview 5/17:  EF 74%, normal perfusion, low risk // Myoview 12/2018:  EF 89, very mild ischemia in inferoapical wall; Low Risk  . History of pulmonary embolism   . Hyperlipidemia   . Insomnia   . Osteoporosis   . Psoriasis   . Pulmonary fibrosis ([place])   . Rheumatoid arthritis [facility])    Past Surgical History:  Past Surgical History:  Procedure Laterality Date  . ABDOMINAL HYSTERECTOMY  1974  . APPENDECTOMY  1959  . CATARACT EXTRACTION    . IR GJ TUBE CHANGE  11/05/2020  . IR REPLC GASTRO/COLONIC TUBE PERCUT W/FLUORO  10/29/2020  . LAPAROTOMY N/A 08/23/2020   Procedure: EXPLORATORY LAPAROTOMY small bowel resection gastrostomy tube placement;  Surgeon: Dwan Bolt, MD;  Location: [facility];  Service: General;  Laterality: N/A;  . LEFT HEART CATHETERIZATION WITH CORONARY ANGIOGRAM N/A 06/18/2013   Procedure: LEFT HEART CATHETERIZATION WITH CORONARY ANGIOGRAM;  Surgeon: Peter M Martinique, MD;  Location: [facility] CATH LAB;  Service: Cardiovascular;  Laterality: N/A;  . TONSILLECTOMY  1941, 1951  . TOTAL KNEE ARTHROPLASTY  1997, 2007   Bilateral    Assessment / Plan / Recommendation Clinical Impression Paitent presents with a mild oropharyngeal dysphagia with impact from patient's  current state of mod-severe oral pain. Today's assessment based on limited amount of intake, a few small sips of thin liquids. Patient's lunch tray was in front of her and appeared as though she took a couple very small bites. SLP discussed swallow and diet consistencies with patient and her sister in law and decision was made to place patient on regular solids, continue thin liquids, in order to allow her to make choices of foods she could tolerate eating. Patient, as well as sister in law, were very aware of food consistencies that she can tolerate with current mouth pain as well as lack of dentures. SLP will follow patient for diet toleration.  Skilled Therapeutic Interventions          BSE  SLP Assessment  Patient will need skilled Speech Lanaguage Pathology Services during CIR admission    Recommendations  SLP Diet Recommendations: Thin;Other (Comment) (soft solids but putting patient on Regular solids diet to allow her ability to choose items she can tolerate) Liquid Administration via: Cup;Straw Medication Administration: Whole meds with puree Supervision: Intermittent supervision to cue for compensatory strategies;Patient able to self feed Compensations: Slow rate;Small sips/bites Postural Changes and/or Swallow Maneuvers: Seated upright 90 degrees Oral Care Recommendations: Oral care BID           Pain Pain Assessment Pain Scale: 0-10 Pain Score: 7  Pain Type: Acute pain Pain Location: Mouth Pain Descriptors / Indicators: Aching;Discomfort Pain Onset: On-going  Bedside Swallowing Assessment General Date of Onset: 10/24/20 Diet Prior to this Study: Dysphagia 1 (puree);Thin liquids Temperature Spikes Noted: No Respiratory Status: Room air History of Recent Intubation: No Behavior/Cognition: Alert;Cooperative;Pleasant mood Oral Cavity - Dentition: Edentulous;Other (Comment) (dentures are too painful to put in) Patient Positioning: Upright in bed Baseline Vocal Quality:  Normal Volitional Cough: Strong Volitional Swallow: Able to elicit  Oral Care Assessment   Ice Chips Ice chips: Not tested Thin Liquid Thin Liquid: Within functional limits Nectar Thick   Honey Thick   Puree Puree: Not tested Solid Solid: Not tested BSE Assessment Risk for Aspiration Impact on safety and function: Mild aspiration risk Other Related Risk Factors: Deconditioning;Lethargy   Sonia Baller, MA, CCC-SLP Speech Therapy

## 2020-11-06 NOTE — Plan of Care (Signed)
  Problem: Sit to Stand Goal: LTG:  Patient will perform sit to stand in prep for activites of daily living with assistance level (OT) Description: LTG:  Patient will perform sit to stand in prep for activites of daily living with assistance level (OT) Flowsheets (Taken 11/06/2020 1522) LTG: PT will perform sit to stand in prep for activites of daily living with assistance level: Minimal Assistance - Patient > 75%   Problem: RH Bathing Goal: LTG Patient will bathe all body parts with assist levels (OT) Description: LTG: Patient will bathe all body parts with assist levels (OT) Flowsheets (Taken 11/06/2020 1523) LTG: Pt will perform bathing with assistance level/cueing: Contact Guard/Touching assist   Problem: RH Dressing Goal: LTG Patient will perform upper body dressing (OT) Description: LTG Patient will perform upper body dressing with assist, with/without cues (OT). Flowsheets (Taken 11/06/2020 1523) LTG: Pt will perform upper body dressing with assistance level of: Supervision/Verbal cueing Goal: LTG Patient will perform lower body dressing w/assist (OT) Description: LTG: Patient will perform lower body dressing with assist, with/without cues in positioning using equipment (OT) Flowsheets (Taken 11/06/2020 1523) LTG: Pt will perform lower body dressing with assistance level of: Minimal Assistance - Patient > 75%   Problem: RH Toileting Goal: LTG Patient will perform toileting task (3/3 steps) with assistance level (OT) Description: LTG: Patient will perform toileting task (3/3 steps) with assistance level (OT)  Flowsheets (Taken 11/06/2020 1523) LTG: Pt will perform toileting task (3/3 steps) with assistance level: Contact Guard/Touching assist   Problem: RH Toilet Transfers Goal: LTG Patient will perform toilet transfers w/assist (OT) Description: LTG: Patient will perform toilet transfers with assist, with/without cues using equipment (OT) Flowsheets (Taken 11/06/2020 1523) LTG: Pt  will perform toilet transfers with assistance level of: Minimal Assistance - Patient > 75%

## 2020-11-06 NOTE — Progress Notes (Signed)
Patient ID: [name] [name], female   DOB: 05/28/1931, 87 y.o.   MRN: [id number] Met with pt and [name] who is present to discuss pt's discharge plan. Pt feels therapies need to be spread out and will be with QD order. She feels yesterday was very taxing on her. She wants to talk with family regarding her plan and has been to both ALF and SNF. Pt feels she needs someone all of the time, which would be hired 24/7 care. Asked abut palliative re-consult and she voiced her wants and desires have not changed she wants everything done for her-full code, aggressive interventions, etc. Pt made QD in her therapies, will see how she does and work on best plan for her at discharge.

## 2020-11-06 NOTE — Progress Notes (Signed)
Physical Therapy Session Note  Patient Details  Name: [name] [name] MRN: [id number] Date of Birth: 1931-03-07  Today's Date: 11/06/2020 PT Individual Time: 1000-1100 PT Individual Time Calculation (min): 60 min   Short Term Goals: Week 1:  PT Short Term Goal 1 (Week 1): pt to demonstrated supine<>sit min A PT Short Term Goal 2 (Week 1): pt to demosntrated functional transfer min A with LRAD PT Short Term Goal 3 (Week 1): pt to demonstrate gait 25' min A with LRAD  Skilled Therapeutic Interventions/Progress Updates:  Training and development officer;Ambulation/gait training;Cognitive remediation/compensation;Community reintegration;Discharge planning;Disease management/prevention;DME/adaptive equipment instruction;Functional mobility training;Wheelchair propulsion/positioning;Visual/perceptual remediation/compensation;UE/LE Coordination activities;UE/LE Strength taining/ROM;Therapeutic Exercise;Therapeutic Activities;Stair training;Splinting/orthotics;Skin care/wound management;Psychosocial support;Patient/family education;Pain management;Neuromuscular re-education   Pt reportedly very fatigued from PT and OT evals. Requesting to rest at this time. PT will follow up as appropriate.  Therapy Documentation Precautions:  Precautions Precautions: Fall Precaution Comments: abdominal binder to protect PEG site, oral thrush/mucositis and mouth ulceration, 2L 02 Restrictions Weight Bearing Restrictions: No   Therapy/Group: Individual Therapy  Breck Coons, PT, DPT 11/06/2020, 1:29 PM

## 2020-11-06 NOTE — Progress Notes (Signed)
Due to her low body weight, ok to change Lovenox to 20mg  SQ qday per Dr. Dagoberto Ligas.  Onnie Boer, PharmD, BCIDP, AAHIVP, CPP Infectious Disease Pharmacist 11/06/2020 10:02 AM

## 2020-11-06 NOTE — Progress Notes (Signed)
PROGRESS NOTE   Subjective/Complaints:  Pt c/o severe gum pain- says nothing that's been tried has worked- [employer] for a short period by taking the edge off, but only for short period.4PEG was replaced last night by Radiology- no note however, so wasn't clear initially.   Said "don't think my body will take much more of this pain"- she hurts so bad.   Told therapy today she doesn't think she's a good candidate for intp rehab- doesn't want more than 1 hours/day- and willing to go to SNF.  Will change to 1x/day of PT and OT.  Will also add norco for pain- if that doesn't work, would do oxycodone- waiting to hear from nurse.   Denies orthostatic hypotension. Ate 10% of meal   ROS:  Pt denies SOB, abd pain, CP, N/V/C/D, and vision changes   Objective:   DG Abd 1 View  Result Date: 11/05/2020 CLINICAL DATA:  Peg tube placement EXAM: ABDOMEN - 1 VIEW COMPARISON:  None. FINDINGS: Omnipaque 300 injected shows filling of the stomach. No evidence of contrast leakage. Tube balloon is inflated. IMPRESSION: Gastrostomy tube well positioned. No evidence of contrast leakage. Electronically Signed   By: Nelson Chimes M.D.   On: 11/05/2020 14:14   IR GJ Tube Change  Result Date: 11/06/2020 INDICATION: 87 year-old female with a displaced percutaneous gastrostomy tube. Patient presents for replacement. EXAM: GASTRIC CATHETER REPLACEMENT MEDICATIONS: None ANESTHESIA/SEDATION: None CONTRAST:  87mL OMNIPAQUE IOHEXOL 300 MG/ML SOLN - administered into the gastric lumen. FLUOROSCOPY TIME:  Not recorded COMPLICATIONS: None immediate. PROCEDURE: The Foley catheter maintaining the tract was deflated and removed. A new 49 French balloon retention percutaneous gastrostomy tube was then lubricated and advanced through the stoma and into the stomach. The retention balloon was inflated and pulled snug against the anterior abdominal wall. The external bumper  was fixed in place. A gentle hand injection of contrast material was performed followed by fluoroscopic saved image. Contrast outlines the stomach confirming intragastric placement. IMPRESSION: Successful replacement of dislodged 20 French percutaneous gastrostomy tube. Electronically Signed   By: Jacqulynn Cadet M.D.   On: 11/06/2020 10:02   Recent Labs    11/04/20 1302 11/05/20 0505  WBC 10.4 11.1*  HGB 11.1* 11.3*  HCT 34.9* 34.8*  PLT 288 332   Recent Labs    11/04/20 0506 11/04/20 1302 11/05/20 0505  NA 133*  --  133*  K 4.0  --  4.1  CL 98  --  98  CO2 27  --  28  GLUCOSE 99  --  102*  BUN 12  --  8  CREATININE 0.50 0.52 0.54  CALCIUM 8.4*  --  8.8*    Intake/Output Summary (Last 24 hours) at 11/06/2020 1407 Last data filed at 11/06/2020 0900 Gross per 24 hour  Intake 1002.5 ml  Output --  Net 1002.5 ml     Pressure Injury 11/04/20 Sacrum Mid Stage 3 -  Full thickness tissue loss. Subcutaneous fat may be visible but bone, tendon or muscle are NOT exposed. Circular pencil eraser like hole on sacrum; initially documented 09/05/20 as stage 2 pressure area (Active)  11/04/20 1330  Location: Sacrum  Location  Orientation: Mid  Staging: Stage 3 -  Full thickness tissue loss. Subcutaneous fat may be visible but bone, tendon or muscle are NOT exposed.  Wound Description (Comments): Circular pencil eraser like hole on sacrum; initially documented 09/05/20 as stage 2 pressure area  Present on Admission: Yes     Pressure Injury Buttocks Left Stage 2 -  Partial thickness loss of dermis presenting as a shallow open injury with a red, pink wound bed without slough. oval shaped denuded area (Active)     Location: Buttocks  Location Orientation: Left  Staging: Stage 2 -  Partial thickness loss of dermis presenting as a shallow open injury with a red, pink wound bed without slough.  Wound Description (Comments): oval shaped denuded area  Present on Admission: Yes     Pressure  Injury 11/04/20 Lumbar Lateral;Right Stage 1 -  Intact skin with non-blanchable redness of a localized area usually over a bony prominence. purewick coiled tubing shaped reddended non blancheable area on flank (Active)  11/04/20 1330  Location: Lumbar  Location Orientation: Lateral;Right  Staging: Stage 1 -  Intact skin with non-blanchable redness of a localized area usually over a bony prominence.  Wound Description (Comments): purewick coiled tubing shaped reddended non blancheable area on flank  Present on Admission: Yes    Physical Exam: Vital Signs Blood pressure (!) 86/60, pulse 88, temperature 97.7 F (36.5 C), resp. rate 19, height 5\' 4"  (1.626 m), weight 36.6 kg, SpO2 99 %.   General: awake, alert, but sedated- kept putting hands over mouth/gums; NAD; BMI of 13.5- very cachetic.  HENT: conjugate gaze; oropharynx a little dry- lacking most teeth; gums aren't irritated CV: regular rate; no JVD Pulmonary: CTA B/L; no W/R/R- good air movement GI: soft, NT, ND, (+)BS- PEG back in place Psychiatric: appropriate- but sad, unhappy; flat affect Neurological: Alert;  Musculoskeletal:     Cervical back: Normal range of motion and neck supple.     Comments: No edema or tenderness in extremities  Skin:    Comments: Stage III sacral ulcer Ulcer on left upper posterior pelvis Abdominal open wound C/D/I Neurological:     Mental Status: She is alert.     Comments: Alert and oriented HOH Motor: B/l UE: 4+/5 proximal to distal RLE: 4/5 proximal to distal LLE: 4/5 HF, KE, 3-/5 ADF     Assessment/Plan: 1. Functional deficits which require 3+ hours per day of interdisciplinary therapy in a comprehensive inpatient rehab setting.  Physiatrist is providing close team supervision and 24 hour management of active medical problems listed below.  Physiatrist and rehab team continue to assess barriers to discharge/monitor patient progress toward functional and medical goals  Care  Tool:  Bathing    Body parts bathed by patient: Right arm,Left arm,Chest,Abdomen,Front perineal area,Buttocks,Right upper leg,Left upper leg,Face   Body parts bathed by helper: Right lower leg,Left lower leg     Bathing assist Assist Level: Minimal Assistance - Patient > 75%     Upper Body Dressing/Undressing Upper body dressing   What is the patient wearing?: Pull over shirt    Upper body assist Assist Level: Minimal Assistance - Patient > 75%    Lower Body Dressing/Undressing Lower body dressing      What is the patient wearing?: Incontinence brief,Pants     Lower body assist Assist for lower body dressing: Maximal Assistance - Patient 25 - 49%     Toileting Toileting    Toileting assist Assist for toileting: Moderate Assistance - Patient 50 -  74% Assistive Device Comment: incontinent brief   Transfers Chair/bed transfer  Transfers assist  Chair/bed transfer activity did not occur: Safety/medical concerns        Locomotion Ambulation   Ambulation assist   Ambulation activity did not occur: Safety/medical concerns          Walk 10 feet activity   Assist  Walk 10 feet activity did not occur: Safety/medical concerns        Walk 50 feet activity   Assist Walk 50 feet with 2 turns activity did not occur: Safety/medical concerns         Walk 150 feet activity   Assist Walk 150 feet activity did not occur: Safety/medical concerns         Walk 10 feet on uneven surface  activity   Assist Walk 10 feet on uneven surfaces activity did not occur: Safety/medical concerns         Wheelchair     Assist Will patient use wheelchair at discharge?: Yes (pt will likely require WC at DC however pt unable to tolerate WC transfer or assessment on this date) Type of Wheelchair: Manual Wheelchair activity did not occur: Safety/medical concerns         Wheelchair 50 feet with 2 turns activity    Assist    Wheelchair 50 feet with  2 turns activity did not occur: Safety/medical concerns       Wheelchair 150 feet activity     Assist  Wheelchair 150 feet activity did not occur: Safety/medical concerns       Blood pressure (!) 86/60, pulse 88, temperature 97.7 F (36.5 C), resp. rate 19, height 5\' 4"  (1.626 m), weight 36.6 kg, SpO2 99 %.  Medical Problem List and Plan: 1.  Debility secondary to abdominal wall cellulitis/sepsis complicated by chronic hypoxic respiratory failure/RA             -patient may not shower             -ELOS/Goals: 14-17 days/Min A             will change to 1x/day of PT and OT- and look for SNF bed- since so low function per pt and PT/OT 2.  Antithrombotics: -DVT/anticoagulation: Lovenox             -antiplatelet therapy: N/A 3. Pain Management: Tylenol as needed  4/29- tried tramadol - didn't work- and try Norco 5/325 mg q4 hours prn 4. Mood: Remeron 15 mg nightly             -antipsychotic agents: N/A 5. Neuropsych: This patient is capable of making decisions on her own behalf. 6. Skin/Wound Care: Routine skin checks.  Foam dressing to umbilicus and change every 3 days as needed soiling.  Apply saline moistened 2 x 2 to abdomen wound daily cover with dry 2 x 2 and tape.  Apply dry split thickness gauze underneath G-tube daily.  4/29-  7. Fluids/Electrolytes/Nutrition: Routine in and outs.             CMP ordered 8.  Rheumatoid arthritis.  Continue prednisone. 9.  Chronic hypoxic respiratory failure.  Oxygen therapy as directed continue chronic prednisone.             Monitor O2 sats with increased exertion 10.  Dysphagia.  Dysphagia #1 thin liquids.  G-tube replaced 10/29/2020 per interventional radiology.  Dietary follow-up             Advance diet as tolerated  4/29- PEG-  fell out- got back in by radiology-but has high risk of coming out- came out with balloon intact- con't to monitor  11.  Thrush/mouth pain/odynophagia.  Follow Dr. Benson Norway  4/29- try Norco/Oxycodone- 5/325 mg  Q4 hours prn- thrush appears to be improved. If doesn't improve, will change to Oxycodone- has no bad reactions to pain meds in past.  12. Severe malnutrition  4/29- con't PEG; TFS nightly- BMI 13.6- con't to push PO- but limited by gum pain.  13. Hypotension-   4/29- BP running 80s/40s-will try Low dose Midodrine- 2.5 mg TID with meals- for lower BP.     LOS: 2 days A FACE TO FACE EVALUATION WAS PERFORMED  Tavoris Brisk 11/06/2020, 2:07 PM

## 2020-11-06 NOTE — Evaluation (Signed)
Physical Therapy Assessment and Plan  Patient Details  Name: [name] [name] MRN: [id number] Date of Birth: [birth year]  PT Diagnosis: Abnormal posture, Abnormality of gait, Difficulty walking and Muscle weakness Rehab Potential: Poor ELOS: 2-2.5 weeks   Today's Date: 11/06/2020 PT Individual Time: 1000-1100 PT Individual Time Calculation (min): 60 min    Hospital Problem: Principal Problem:   Debility   Past Medical History:  Past Medical History:  Diagnosis Date  . Allergic rhinitis   . Allergy   . CAD (coronary artery disease)    Mild CAD by cath 2008  . CHF (congestive heart failure) ([place])   . DJD (degenerative joint disease)    rheumatoid  . DVT (deep venous thrombosis) ([place])   . Dyslipidemia   . History of echocardiogram    Echo 12/2018: EF 60-65, mod asymmetric LVH, Gr 1 DD  . History of nuclear stress test    Myoview 5/17:  EF 74%, normal perfusion, low risk // Myoview 12/2018:  EF 89, very mild ischemia in inferoapical wall; Low Risk  . History of pulmonary embolism   . Hyperlipidemia   . Insomnia   . Osteoporosis   . Psoriasis   . Pulmonary fibrosis ([place])   . Rheumatoid arthritis [facility])    Past Surgical History:  Past Surgical History:  Procedure Laterality Date  . ABDOMINAL HYSTERECTOMY  1974  . APPENDECTOMY  1959  . CATARACT EXTRACTION    . IR GJ TUBE CHANGE  11/05/2020  . IR REPLC GASTRO/COLONIC TUBE PERCUT W/FLUORO  10/29/2020  . LAPAROTOMY N/A 08/23/2020   Procedure: EXPLORATORY LAPAROTOMY small bowel resection gastrostomy tube placement;  Surgeon: Dwan Bolt, MD;  Location: [facility] ORS;  Service: General;  Laterality: N/A;  . LEFT HEART CATHETERIZATION WITH CORONARY ANGIOGRAM N/A 06/18/2013   Procedure: LEFT HEART CATHETERIZATION WITH CORONARY ANGIOGRAM;  Surgeon: Peter M Martinique, MD;  Location: [facility] CATH LAB;  Service: Cardiovascular;  Laterality: N/A;  . TONSILLECTOMY  1941, 1951  . TOTAL KNEE ARTHROPLASTY  1997, 2007   Bilateral    Assessment &  Plan Clinical Impression: Patient is a 87 y.o. year old female  with history of chronic hypoxic respiratory failure /interstitial pneumonia/pulmonary fibrosis on 2 L oxygen, chronic prednisone/CellCept for rheumatoid arthritis, history of DVT/PE, SBO status post exploratory laparotomy February 2022 with G-tube placement 08/23/2020 that is since been removed.  History taken from chart review and patient. Per chart review patient lives alone.  1 level home 2 steps to entry.  Patient reportedly independent until 2 months ago she now has an aide a few hours a day 2 days a week.  She has had a couple of falls in the last couple of weeks.  She presented on 10/23/2020 with increasing abdominal pain and some wound drainage from prior G-tube and some AMS as well as right foot drop.  She was admitted a few weeks ago in February with complications from her exploratory laparotomy requiring TPN and was discharged to a skilled nursing facility for short time.  Admission chemistry sodium 146, potassium 5/3, WBC 11,400, hemoglobin 11.9, lactic acid 2.7-3.4, blood cultures no growth to date, COVID-negative.  CT of abdomen pelvis showed no acute process in the abdomen or pelvis.  Cranial CT scan unremarkable for acute intracranial process. General surgery consulted initially with wound VAC later discontinued changed to wet-to-dry dressings..  Palliative care consulted for goals of care.  Due to patient's poor p.o. intake interventional radiology consulted as well as ongoing significant dysphagia, G-tube was placed  10/29/2020 per interventional radiology.  Patient did have some thrush and mouth pain CT maxillofacial showed fairly diffuse mucosal enhancement within the oral cavity likely reflecting sequela of reported thrush/mucositis.  Dr.Owsley consulted and felt her lower lip was sinking into her lower implants causing some ulceration of both sides of her inner lip and maintained on chlorhexidine.  In regards to patient's left foot  drop suspect stretch peroneal nerve palsy. Maintained on Lovenox for DVT prophylaxis.  Therapy evaluations completed due to patient's debility was admitted for a comprehensive rehab program. Please see preadmission assessment from earlier today as well.   Patient transferred to CIR on 11/04/2020 .   Patient currently requires max with mobility secondary to muscle weakness, decreased cardiorespiratoy endurance and decreased oxygen support and decreased sitting balance, decreased standing balance, decreased postural control and decreased balance strategies.  Prior to hospitalization, patient was min with mobility and lived with Alone in a House (pt lived at home alone however post most recent discharge was in [place]) home.  Home access is 2Level entry.  Patient will benefit from skilled PT intervention to maximize safe functional mobility, minimize fall risk and decrease caregiver burden for planned discharge home with 24 hour assist.  Anticipate patient will additional therapy and 24/7 supervision and assistance at discharge.  PT - End of Session Activity Tolerance: Tolerates 10 - 20 min activity with multiple rests Endurance Deficit: Yes PT Assessment Rehab Potential (ACUTE/IP ONLY): Poor PT Barriers to Discharge: [place] home environment;Decreased caregiver support;Home environment access/layout;Medication compliance;Weight;Behavior;New oxygen;Nutrition means PT Barriers to Discharge Comments: pt's overall tolerance to activity and health status PT Patient demonstrates impairments in the following area(s): Balance;Safety;Nutrition;Behavior;Pain;Endurance;Skin Integrity PT Transfers Functional Problem(s): Bed Mobility;Bed to Chair PT Locomotion Functional Problem(s): Wheelchair Mobility;Ambulation PT Plan PT Intensity: Minimum of 1-2 x/day ,45 to 90 minutes PT Frequency: Total of 15 hours over 7 days of combined therapies PT Duration Estimated Length of Stay: 2-2.5 weeks PT  Treatment/Interventions: Training and development officer;Ambulation/gait training;Cognitive remediation/compensation;Community reintegration;Discharge planning;Disease management/prevention;DME/adaptive equipment instruction;Functional mobility training;Wheelchair propulsion/positioning;Visual/perceptual remediation/compensation;UE/LE Coordination activities;UE/LE Strength taining/ROM;Therapeutic Exercise;Therapeutic Activities;Stair training;Splinting/orthotics;Skin care/wound management;Psychosocial support;Patient/family education;Pain management;Neuromuscular re-education PT Transfers Anticipated Outcome(s): min A PT Locomotion Anticipated Outcome(s): mod A WC level PT Recommendation Follow Up Recommendations: Skilled nursing facility Patient destination: [place] (SNF) Equipment Recommended: To be determined   PT Evaluation Precautions/Restrictions Precautions Precautions: Fall Precaution Comments: abdominal binder in place Restrictions Weight Bearing Restrictions: No General   Vital Signs  Pain Pain Assessment Pain Scale: 0-10 Pain Score: 7  Pain Type: Acute pain;Surgical pain Pain Location: Abdomen Pain Orientation: Anterior Pain Descriptors / Indicators: Aching;Discomfort;Sharp Pain Onset: On-going Pain Intervention(s): RN made aware;Repositioned Multiple Pain Sites: No Home Living/Prior Functioning Home Living Available Help at Discharge: Family;Available 24 hours/day;Other (Comment) (pt had assistance during the day a few days per week) Type of Home: House (pt lived at home alone however post most recent discharge was in ALF) Home Access: Level entry Entrance Stairs-Number of Steps: 2 [place]: One level Bathroom Shower/Tub: Multimedia programmer: Handicapped height Bathroom Accessibility: Yes  Lives With: Alone Prior Function Level of Independence: Independent with basic ADLs;Independent with homemaking with ambulation;Independent with  gait;Independent with transfers;Requires assistive device for independence (pt reports being I "a few months ago")  Able to Take Stairs?: No Driving: No Comments: ~2 months ago she was independent with ambulation and had an aide a few hr/day 2 days/week.  Has had recent falls.  pt d/c to SNF for 2 weeks after last admission then to ALF  with 24 hr care. Vision/Perception  Perception Perception: Within Functional Limits Praxis Praxis: Intact  Cognition Overall Cognitive Status: Impaired/Different from baseline Arousal/Alertness: Lethargic Orientation Level: Oriented to person;Disoriented to place;Oriented to situation Attention: Focused;Sustained Focused Attention: Appears intact Sustained Attention: Appears intact Sensation Sensation Light Touch: Appears Intact Motor  Motor Motor: Within Functional Limits Motor - Skilled Clinical Observations: limited with generalized weakness and pain   Trunk/Postural Assessment  Cervical Assessment Cervical Assessment: Exceptions to [facility] (forward head) Thoracic Assessment Thoracic Assessment: Exceptions to [facility] (kyphotic) Lumbar Assessment Lumbar Assessment: Exceptions to [facility] (posterior pelvic tilt) Postural Control Postural Control:  (limited with pain and weakness)  Balance Balance Balance Assessed: No (pt unable to remain sitting long enough to formally assess balance) Extremity Assessment      RLE Assessment RLE Assessment: Exceptions to [facility] General Strength Comments: grossly 3/5 assessed in bed LLE Assessment LLE Assessment: Exceptions to [facility] General Strength Comments: grossly 3/5 assessed in bed  Care Tool Care Tool Bed Mobility Roll left and right activity   Roll left and right assist level: Moderate Assistance - Patient 50 - 74%    Sit to lying activity   Sit to lying assist level: Maximal Assistance - Patient 25 - 49%    Lying to sitting edge of bed activity   Lying to sitting edge of bed assist level: Maximal Assistance  - Patient 25 - 49%     Care Tool Transfers Sit to stand transfer Sit to stand activity did not occur: Safety/medical concerns      Chair/bed transfer Chair/bed transfer activity did not occur: Safety/medical concerns       Toilet transfer Toilet transfer activity did not occur: Safety/medical concerns      Scientist, product/process development transfer activity did not occur: Safety/medical concerns        Care Tool Locomotion Ambulation Ambulation activity did not occur: Safety/medical concerns        Walk 10 feet activity Walk 10 feet activity did not occur: Safety/medical concerns       Walk 50 feet with 2 turns activity Walk 50 feet with 2 turns activity did not occur: Safety/medical concerns      Walk 150 feet activity Walk 150 feet activity did not occur: Safety/medical concerns      Walk 10 feet on uneven surfaces activity Walk 10 feet on uneven surfaces activity did not occur: Safety/medical concerns      Stairs Stair activity did not occur: Safety/medical concerns        Walk up/down 1 step activity Walk up/down 1 step or curb (drop down) activity did not occur: Safety/medical concerns     Walk up/down 4 steps activity did not occuR: Safety/medical concerns  Walk up/down 4 steps activity      Walk up/down 12 steps activity Walk up/down 12 steps activity did not occur: Safety/medical concerns      Pick up small objects from floor Pick up small object from the floor (from standing position) activity did not occur: Safety/medical concerns      Wheelchair Will patient use wheelchair at discharge?: Yes (pt will likely require WC at DC however pt unable to tolerate WC transfer or assessment on this date) Type of Wheelchair: Manual Wheelchair activity did not occur: Safety/medical concerns      Wheel 50 feet with 2 turns activity Wheelchair 50 feet with 2 turns activity did not occur: Safety/medical concerns    Wheel 150 feet activity Wheelchair 150 feet activity did not occur:  Safety/medical concerns  Refer to Care Plan for Long Term Goals  SHORT TERM GOAL WEEK 1 PT Short Term Goal 1 (Week 1): pt to demonstrated supine<>sit min A PT Short Term Goal 2 (Week 1): pt to demosntrated functional transfer min A with LRAD PT Short Term Goal 3 (Week 1): pt to demonstrate gait 25' min A with LRAD  Recommendations for other services: Other: palliative  Skilled Therapeutic Intervention  Evaluation completed (see details above and below) with education on PT POC and goals and individual treatment initiated with focus on  Bed mobility, safety awareness, call light use. pt received in bed and agreeable to therapy. Pt had multiple questions about POC with PT with inpatient rehab and expressed several concerns with her ability to tolerate amount of therapy required for inpatient rehab. PT educated pt on CIR requirements, options for scheduling, 15/7 options and QD options. Pt requested to get information on SNF placement as well and requested to discuss this with her family. PT discussed this with case manager as well and provided with handouts for this as well. Pt directed in rolling in bed mod A R and L and directed in supine>sit max A. Pt unable to tolerate longer than 1 min reporting fatigue. Pt directed in returning to supine max A. Pt left in bed All needs in reach and in good condition. Call light in hand.  And alarm set. Pt denied additional questions at this time but did request to have therapy as spaced out as possible.    Mobility Bed Mobility Bed Mobility: Rolling Right;Rolling Left;Supine to Sit;Sit to Supine Rolling Right: Moderate Assistance - Patient 50-74% Rolling Left: Moderate Assistance - Patient 50-74% Supine to Sit: Maximal Assistance - Patient - Patient 25-49% Sit to Supine: Maximal Assistance - Patient 25-49% Transfers Transfers:  (unsafe to attempt) Locomotion  Gait Ambulation: No Gait Gait: No Stairs / Additional Locomotion Stairs:  No Wheelchair Mobility Wheelchair Mobility: No (pt would likely benefit from [facility] assessment however unable to tolerate this during initial assessment)   Discharge Criteria: Patient will be discharged from PT if patient refuses treatment 3 consecutive times without medical reason, if treatment goals not met, if there is a change in medical status, if patient makes no progress towards goals or if patient is discharged from hospital.  The above assessment, treatment plan, treatment alternatives and goals were discussed and mutually agreed upon: by patient  Junie Panning 11/06/2020, 12:26 PM

## 2020-11-06 NOTE — Evaluation (Signed)
Occupational Therapy Assessment and Plan  Patient Details  Name: [name] [name] MRN: [id number] Date of Birth: [birth year]  OT Diagnosis: abnormal posture, acute pain, cognitive deficits and muscle weakness (generalized) Rehab Potential: Rehab Potential (ACUTE ONLY): Fair ELOS: 2 to 2.5 weeks   Today's Date: 11/06/2020 OT Individual Time: 6861-6837 OT Individual Time Calculation (min): 62 min     Hospital Problem: Principal Problem:   Debility   Past Medical History:  Past Medical History:  Diagnosis Date  . Allergic rhinitis   . Allergy   . CAD (coronary artery disease)    Mild CAD by cath 2008  . CHF (congestive heart failure) ([place])   . DJD (degenerative joint disease)    rheumatoid  . DVT (deep venous thrombosis) ([place])   . Dyslipidemia   . History of echocardiogram    Echo 12/2018: EF 60-65, mod asymmetric LVH, Gr 1 DD  . History of nuclear stress test    Myoview 5/17:  EF 74%, normal perfusion, low risk // Myoview 12/2018:  EF 89, very mild ischemia in inferoapical wall; Low Risk  . History of pulmonary embolism   . Hyperlipidemia   . Insomnia   . Osteoporosis   . Psoriasis   . Pulmonary fibrosis ([place])   . Rheumatoid arthritis [facility])    Past Surgical History:  Past Surgical History:  Procedure Laterality Date  . ABDOMINAL HYSTERECTOMY  1974  . APPENDECTOMY  1959  . CATARACT EXTRACTION    . IR GJ TUBE CHANGE  11/05/2020  . IR REPLC GASTRO/COLONIC TUBE PERCUT W/FLUORO  10/29/2020  . LAPAROTOMY N/A 08/23/2020   Procedure: EXPLORATORY LAPAROTOMY small bowel resection gastrostomy tube placement;  Surgeon: Dwan Bolt, MD;  Location: [facility] ORS;  Service: General;  Laterality: N/A;  . LEFT HEART CATHETERIZATION WITH CORONARY ANGIOGRAM N/A 06/18/2013   Procedure: LEFT HEART CATHETERIZATION WITH CORONARY ANGIOGRAM;  Surgeon: Peter M Martinique, MD;  Location: [facility] CATH LAB;  Service: Cardiovascular;  Laterality: N/A;  . TONSILLECTOMY  1941, 1951  . TOTAL KNEE ARTHROPLASTY  1997,  2007   Bilateral    Assessment & Plan Clinical Impression: [name] Pratt. [name] is an 87 year old right-handed female with history of chronic hypoxic respiratory failure /interstitial pneumonia/pulmonary fibrosis on 2 L oxygen, chronic prednisone/CellCept for rheumatoid arthritis, history of DVT/PE, SBO status post exploratory laparotomy February 2022 with G-tube placement 08/23/2020 that is since been removed.  History taken from chart review and patient. Per chart review patient lives alone.  1 level home 2 steps to entry.  Patient reportedly independent until 2 months ago she now has an aide a few hours a day 2 days a week.  She has had a couple of falls in the last couple of weeks.  She presented on 10/23/2020 with increasing abdominal pain and some wound drainage from prior G-tube and some AMS as well as right foot drop.  She was admitted a few weeks ago in February with complications from her exploratory laparotomy requiring TPN and was discharged to a skilled nursing facility for short time.  Admission chemistry sodium 146, potassium 5/3, WBC 11,400, hemoglobin 11.9, lactic acid 2.7-3.4, blood cultures no growth to date, COVID-negative.  CT of abdomen pelvis showed no acute process in the abdomen or pelvis.  Cranial CT scan unremarkable for acute intracranial process. General surgery consulted initially with wound VAC later discontinued changed to wet-to-dry dressings..  Palliative care consulted for goals of care.  Due to patient's poor p.o. intake interventional radiology consulted as well as  ongoing significant dysphagia, G-tube was placed 10/29/2020 per interventional radiology.  Patient did have some thrush and mouth pain CT maxillofacial showed fairly diffuse mucosal enhancement within the oral cavity likely reflecting sequela of reported thrush/mucositis.  Dr.Owsley consulted and felt her lower lip was sinking into her lower implants causing some ulceration of both sides of her inner lip and maintained on  chlorhexidine.  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 Maintained on Lovenox for DVT prophylaxis.  Therapy evaluations completed due to patient's debility was admitted for a comprehensive rehab program. Please see preadmission assessment from earlier today as well.   Patient currently requires mod-max with basic self-care skills secondary to muscle weakness, decreased cardiorespiratoy endurance, decreased awareness and decreased memory and decreased sitting balance, decreased standing balance and decreased postural control.  Prior to hospitalization, patient could complete BADLs with independent .  Patient will benefit from skilled intervention to increase independence with basic self-care skills prior to discharge. Discharge location is yet to be determined by family. Anticipate patient will require 24 hour supervision and minimal physical assistance and f/u OT at whatever d/c setting is decided.  OT - End of Session Endurance Deficit: Yes Endurance Deficit Description: Pt needed several rest breaks during session and requested to return to bed at end of tx due to dizziness/fatigue OT Assessment Rehab Potential (ACUTE ONLY): Fair OT Barriers to Discharge: Home environment access/layout;Insurance for SNF coverage;Weight;Wound Care;Nutrition means;Other (comments) OT Barriers to Discharge Comments: D/c location still undecided, unsure if pt can physically tolerate 3 hours therapy every day OT Patient demonstrates impairments in the following area(s): Balance;Cognition;Endurance;Motor;Nutrition;Pain;Safety;Skin Integrity OT Basic ADL's Functional Problem(s): Grooming;Bathing;Toileting;Dressing OT Advanced ADL's Functional Problem(s): Light Housekeeping OT Transfers Functional Problem(s): Toilet;Tub/Shower OT Additional Impairment(s): None OT Plan OT Intensity: Minimum of 1-2 x/day, 45 to 90 minutes OT Frequency: 5 out of 7 days OT Duration/Estimated Length of  Stay: 2 to 2.5 weeks OT Treatment/Interventions: Balance/vestibular training;DME/adaptive equipment instruction;Patient/family education;Therapeutic Activities;Wheelchair propulsion/positioning;Therapeutic Exercise;Psychosocial support;Cognitive remediation/compensation;Community reintegration;Functional mobility training;Self Care/advanced ADL retraining;UE/LE Strength taining/ROM;UE/LE Coordination activities;Skin care/wound managment;Discharge planning;Disease mangement/prevention;Pain management OT Self Feeding Anticipated Outcome(s): No goal OT Basic Self-Care Anticipated Outcome(s): Supervision-Min A OT Toileting Anticipated Outcome(s): Min A OT Bathroom Transfers Anticipated Outcome(s): Min A OT Recommendation Patient destination:  (D/c location undecided per chart) Follow Up Recommendations: Other (comment) (f/u recommendation depends on d/c location, which is undecided)   OT Evaluation Precautions/Restrictions  Precautions Precautions: Fall Precaution Comments: abdominal binder to protect PEG site, oral thrush/mucositis and mouth ulceration, 2L 02 Restrictions Weight Bearing Restrictions: No Pain Pain Assessment Pain Scale: 0-10 Pain Score: 7  Pain Type: Acute pain;Surgical pain Pain Location: Abdomen Pain Orientation: Anterior Pain Descriptors / Indicators: Aching;Discomfort;Sharp Pain Onset: On-going Pain Intervention(s): RN made aware;Repositioned Multiple Pain Sites: No Home Living/Prior [place] expects to be discharged to:: Private residence Living Arrangements: Other (Comment) (ALF) Available Help at Discharge: Family,Available 24 hours/day,Other (Comment) (Pt had an aide come in for a few days a week PTA) Type of Home: House Home Access: Level entry Entrance Stairs-Number of Steps: 2 Home Layout: One level Bathroom Shower/Tub: Multimedia programmer: Handicapped height Bathroom Accessibility: Yes Additional Comments: Pt  reports that she and family are unsure where she will d/c to  Lives With: Alone IADL History Homemaking Responsibilities: No (Pt reports her home health aide assisted with meal prep, cleaning, and driving IADL needs) Occupation: Retired Type of Occupation: Worked for [employer]: [employer] Prior Function Level of Independence:  Independent with basic ADLs  Able to Take Stairs?: No Driving: No Comments: ~2 months ago she was independent with ambulation and had an aide a few hr/day 2 days/week.  Has had recent falls.  pt d/c to SNF for 2 weeks after last admission then to ALF with 24 hr care. Vision Baseline Vision/History: Wears glasses Patient Visual Report: No change from baseline Perception  Perception: Within Functional Limits Praxis Praxis: Intact Cognition Overall Cognitive Status: Impaired/Different from baseline Arousal/Alertness: Awake/alert Orientation Level: Person;Place;Situation Person: Oriented Place: Oriented Situation: Oriented Year: 2022 Month: April Day of Week: Correct Memory: Impaired Immediate Memory Recall: Sock;Blue;Bed Memory Recall Sock: With Cue Memory Recall Blue: Without Cue Memory Recall Bed: Without Cue Attention: Focused;Sustained Focused Attention: Appears intact Sustained Attention: Appears intact Awareness: Impaired Awareness Impairment: Emergent impairment Safety/Judgment: Appears intact Sensation Sensation Light Touch: Appears Intact Coordination Gross Motor Movements are Fluid and Coordinated: No Fine Motor Movements are Fluid and Coordinated: Yes Coordination and Movement Description: Guarded gross motor movements due to pain Finger Nose Finger Test: WNL bilaterally Motor  Motor Motor: Other (comment) Motor - Skilled Clinical Observations: General debility with pain limiting functional abilities  Trunk/Postural Assessment  Cervical Assessment Cervical Assessment: Exceptions to [facility] (forward flexed, protrusion of  cervical vertebrae) Thoracic Assessment Thoracic Assessment: Exceptions to [facility] (kyphotic) Lumbar Assessment Lumbar Assessment: Exceptions to [facility] (posterior pelvic tilt) Postural Control Postural Control: Deficits on evaluation (decreased in standing)  Balance Balance Balance Assessed: Yes Dynamic Sitting Balance Dynamic Sitting - Balance Support: During functional activity;Feet supported;No upper extremity supported Dynamic Sitting - Level of Assistance: 4: Min assist (attempting to don her Ted stockings) Dynamic Sitting - Balance Activities: Lateral lean/weight shifting;Forward lean/weight shifting Dynamic Standing Balance Dynamic Standing - Balance Support: During functional activity;Left upper extremity supported Dynamic Standing - Level of Assistance: 3: Mod assist Dynamic Standing - Balance Activities:  (perihygiene completion during toileting using BSC) Extremity/Trunk Assessment RUE Assessment RUE Assessment: Exceptions to [facility] Active Range of Motion (AROM) Comments: Active shoulder ROM limited to 150-160 degrees, note that pt gravitates into scaption vs abduction General Strength Comments: 3/5 to 3+/5 grossly LUE Assessment LUE Assessment: Exceptions to [facility] Active Range of Motion (AROM) Comments: Shoulder ROM limited to 150-160 degrees, pt gravitates into scaption vs abduction General Strength Comments: 3/5 to 3+/5 grossly  Care Tool Care Tool Self Care Eating    not assessed    Oral Care  Oral care, brush teeth, clean dentures activity did not occur:  (unsafe due to ulcerations in mouth)      Bathing   Body parts bathed by patient: Right arm;Left arm;Chest;Abdomen;Front perineal area;Buttocks;Right upper leg;Left upper leg;Face Body parts bathed by helper: Right lower leg;Left lower leg   Assist Level: Minimal Assistance - Patient > 75%    Upper Body Dressing(including orthotics)   What is the patient wearing?: Pull over shirt   Assist Level: Minimal Assistance -  Patient > 75%    Lower Body Dressing (excluding footwear)   What is the patient wearing?: Incontinence brief;Pants Assist for lower body dressing: Maximal Assistance - Patient 25 - 49%    Putting on/Taking off footwear   What is the patient wearing?: Non-skid slipper socks;Ted hose Assist for footwear: Total Assistance - Patient < 25%       Care Tool Toileting Toileting activity   Assist for toileting: Moderate Assistance - Patient 50 - 74%     Care Tool Bed Mobility Roll left and right activity   Roll left and right assist level:  Moderate Assistance - Patient 50 - 74%    Sit to lying activity   Sit to lying assist level: Maximal Assistance - Patient 25 - 49%    Lying to sitting edge of bed activity   Lying to sitting edge of bed assist level: Maximal Assistance - Patient 25 - 49%     Care Tool Transfers Sit to stand transfer Sit to stand activity did not occur: Safety/medical concerns      Chair/bed transfer Chair/bed transfer activity did not occur: Safety/medical concerns       Toilet transfer Toilet transfer activity did not occur: Safety/medical concerns Assist Level: Moderate Assistance - Patient 50 - 74%     Care Tool Cognition Expression of Ideas and Wants Expression of Ideas and Wants: Some difficulty - exhibits some difficulty with expressing needs and ideas (e.g, some words or finishing thoughts) or speech is not clear   Understanding Verbal and Non-Verbal Content Understanding Verbal and Non-Verbal Content: Usually understands - understands most conversations, but misses some part/intent of message. Requires cues at times to understand   Memory/Recall Ability *first 3 days only Memory/Recall Ability *first 3 days only: Current season;That he or she is in a hospital/hospital unit;Location of own room    Refer to Care Plan for Long Term Goals  SHORT TERM GOAL WEEK 1 OT Short Term Goal 1 (Week 1): Pt will complete LB dressing with Mod A OT Short Term Goal 2  (Week 1): Pt will complete 1 grooming task while standing at the sink to increase standing endurance OT Short Term Goal 3 (Week 1): Pt will complete sit<stand during toileting with Min A and LRAD  Recommendations for other services: None    Skilled Therapeutic Intervention Skilled OT session completed with focus on initial evaluation, education on OT role/POC, and establishment of patient-centered goals.  Pt greeted in bed, reporting pain in mouth and also PEG site. Pain reported to be manageable during session and she was premedicated for pain prior to session. 02 sats on 2L 99%. Pt agreeable to complete bathing/dressing tasks while sitting EOB, CGA for transition to EOB with increased time due to pain in abdomen/PEG site. Pt using the bedrail with HOB raised during supine<sit. Pt able to maintain sitting balance without additional assistance while bilaterally engaged in UB bathing. Note that she initiated removal of her nasal cannula, stated that she didn't wear it during shower at home. 02 sats dropped to 88-89% during participation, so we reapplied the nasal cannula for remainder of session. She told me that she was able to don her own Ted stockings but was unable to do so when attempting. Pt stating "What's wrong with me? I could do this before." Mod A for sit<stand without AD for washing periareas and pt reported needing to void bladder. Mod A for stand pivot<BSC without AD, pt with very forward flexed posture and posterior LOB when trying to sit on BSC. The dynamap took a long time to read her 02 sats afterwards, when reading finally came up it was 72% and increased to 99% in less than ~15 seconds. Pt reported feeling fine, a little lightheaded but stating "It's because I've been in the bed so long." After voiding and completing perihygiene at sit<stand level, pt reported increased dizziness. Requesting to return to bed. Max A for LB dressing and Min A for UB dressing before pivoting back to bed. When  she was semi reclined for comfort BP assessed with reading 107/77, 02 sats 100%. She remained in  bed at close of session, all needs within reach and bed alarm set.   ADL ADL Eating: Not assessed Grooming: Setup Where Assessed-Grooming: Edge of bed Upper Body Bathing: Setup Where Assessed-Upper Body Bathing: Edge of bed Lower Body Bathing: Minimal assistance Where Assessed-Lower Body Bathing: Other (Comment) (BSC) Upper Body Dressing: Minimal assistance Where Assessed-Upper Body Dressing: Other (Comment) (sitting on BSC) Lower Body Dressing: Dependent Where Assessed-Lower Body Dressing: Other (Comment) (sit<stand from [facility]) Toileting: Moderate assistance Where Assessed-Toileting: Bedside Commode Toilet Transfer: Moderate assistance Toilet Transfer Method: Stand pivot (without AD) Toilet Transfer Equipment: Bedside commode Tub/Shower Transfer: Not assessed Mobility  Sit<stand with Mod A without AD   Discharge Criteria: Patient will be discharged from OT if patient refuses treatment 3 consecutive times without medical reason, if treatment goals not met, if there is a change in medical status, if patient makes no progress towards goals or if patient is discharged from hospital.  The above assessment, treatment plan, treatment alternatives and goals were discussed and mutually agreed upon: by patient  Skeet Simmer 11/06/2020, 12:53 PM

## 2020-11-07 DIAGNOSIS — Z931 Gastrostomy status: Secondary | ICD-10-CM

## 2020-11-07 DIAGNOSIS — Z515 Encounter for palliative care: Secondary | ICD-10-CM

## 2020-11-07 DIAGNOSIS — Z7189 Other specified counseling: Secondary | ICD-10-CM

## 2020-11-07 LAB — GLUCOSE, CAPILLARY
Glucose-Capillary: 105 mg/dL — ABNORMAL HIGH (ref 70–99)
Glucose-Capillary: 108 mg/dL — ABNORMAL HIGH (ref 70–99)
Glucose-Capillary: 111 mg/dL — ABNORMAL HIGH (ref 70–99)
Glucose-Capillary: 122 mg/dL — ABNORMAL HIGH (ref 70–99)
Glucose-Capillary: 146 mg/dL — ABNORMAL HIGH (ref 70–99)
Glucose-Capillary: 154 mg/dL — ABNORMAL HIGH (ref 70–99)

## 2020-11-07 MED ORDER — ZINC OXIDE 12.8 % EX OINT
TOPICAL_OINTMENT | Freq: Two times a day (BID) | CUTANEOUS | Status: DC
  Administered 2020-11-14: 1 via TOPICAL
  Filled 2020-11-07 (×3): qty 56.7

## 2020-11-07 NOTE — IPOC Note (Signed)
Overall Plan of Care [facility]) Patient Details Name: ANIYIAH ZELL MRN: [id number] DOB: [birth year]  Admitting Diagnosis: Debility  Hospital Problems: Principal Problem:   Debility     Functional Problem List: Nursing Bladder,Bowel,Endurance,Medication Management,Nutrition,Pain,Safety,Skin Integrity  PT Balance,Safety,Nutrition,Behavior,Pain,Endurance,Skin Integrity  OT Balance,Cognition,Endurance,Motor,Nutrition,Pain,Safety,Skin Integrity  SLP Cognition  TR         Basic ADL's: OT Grooming,Bathing,Toileting,Dressing     Advanced  ADL's: OT [employer]     Transfers: PT Bed Mobility,Bed to Chair  OT Toilet,Tub/Shower     Locomotion: PT Wheelchair Mobility,Ambulation     Additional Impairments: OT None  SLP Other (comment) (BSE pending)      TR      Anticipated Outcomes Item Anticipated Outcome  Self Feeding No goal  Swallowing  mod I   Basic self-care  Supervision-Min A  Toileting  Min A   Bathroom Transfers Min A  Bowel/Bladder  min assist  Transfers  min A  Locomotion  mod A WC level  Communication  N/A  Cognition  mod I  Pain  <4  Safety/Judgment  min assist and no falls   Therapy Plan: PT Intensity: Minimum of 1-2 x/day ,45 to 90 minutes PT Frequency: Total of 15 hours over 7 days of combined therapies PT Duration Estimated Length of Stay: 2-2.5 weeks OT Intensity: Minimum of 1-2 x/day, 45 to 90 minutes OT Frequency: 5 out of 7 days OT Duration/Estimated Length of Stay: 2 to 2.5 weeks SLP Intensity: Minumum of 1-2 x/day, 30 to 90 minutes SLP Frequency: 3 to 5 out of 7 days SLP Duration/Estimated Length of Stay: 2 weeks   Due to the current state of emergency, patients may not be receiving their 3-hours of Medicare-mandated therapy.   Team Interventions: Nursing Interventions Patient/Family Education,Bladder Management,Bowel Management,Disease Management/Prevention,Pain Management,Medication Management,Skin Care/Wound  TEFL teacher  PT interventions Balance/vestibular training,Ambulation/gait training,Cognitive remediation/compensation,Community reintegration,Discharge planning,Disease management/prevention,DME/adaptive equipment instruction,Functional mobility training,Wheelchair propulsion/positioning,Visual/perceptual remediation/compensation,UE/LE Coordination activities,UE/LE Strength taining/ROM,Therapeutic Exercise,Therapeutic Activities,Stair training,Splinting/orthotics,Skin care/wound management,Psychosocial support,Patient/family education,Pain management,Neuromuscular re-education  OT Interventions Balance/vestibular training,DME/adaptive equipment instruction,Patient/family education,Therapeutic Activities,Wheelchair propulsion/positioning,Therapeutic Exercise,Psychosocial support,Cognitive remediation/compensation,Community reintegration,Functional mobility training,Self Care/advanced ADL retraining,UE/LE Strength taining/ROM,UE/LE Coordination activities,Skin care/wound managment,Discharge planning,Disease mangement/prevention,Pain management  SLP Interventions Cognitive remediation/compensation,Dysphagia/aspiration precaution training,Patient/family education  TR Interventions    SW/CM Interventions Discharge Planning,Psychosocial Support,Patient/Family Education   Barriers to Discharge MD  Medical stability  Nursing Decreased caregiver support,Home environment access/layout,Incontinence,Wound Care,Lack of/limited family support,Weight,Medication compliance,Behavior,Nutrition means,New oxygen    PT Inaccessible home environment,Decreased caregiver support,Home environment access/layout,Medication compliance,Weight,Behavior,New oxygen,Nutrition means pt's overall tolerance to activity and health status  OT Home environment access/layout,Insurance for SNF coverage,Weight,Wound Care,Nutrition means,Other (comments) D/c  location still undecided, unsure if pt can physically tolerate 3 hours therapy every day  SLP Decreased caregiver support    SW Wound Care,Other (comments)     Team Discharge Planning: Destination: PT-Skilled Nursing Facility (SNF) ,OT-  (D/c location undecided per chart) , SLP-Home Projected Follow-up: PT-Skilled nursing facility, OT-  Other (comment) (f/u recommendation depends on d/c location, which is undecided), SLP-Other (comment) (TBD) Projected Equipment Needs: PT-To be determined, OT-  , SLP-None recommended by SLP Equipment Details: PT- , OT-  Patient/family involved in discharge planning: PT- Patient,  OT-Patient, SLP-Patient  MD ELOS: 14-17 days [facility] Prognosis:  Good Assessment: Mr.s Koenen is an 87 year old woman admitted to CIR with debility secondary to abdominal wall cellulitis/sepsis complicated by chronic hypoxic respiratory failure/RA. Her course has been complicated by severe gum pain, abdominal wounds, and dysphagia, requiring medication management, monitoring  of labs and vitals.   See Team Conference Notes for weekly updates to the plan of care

## 2020-11-07 NOTE — Consult Note (Signed)
[place] Nurse wound follow up Wound type: Peri-PEG tube leakage continues and skin irritation persists. Last seen by my partner on 4/27.  Seen prior to that numerous times by [facility] nurse associates.  ICD-10 CM Codes for Irritant Dermatitis  L24B1 - Related to digestive stoma or fistula  Measurement: peri-PEG tube, diffuse erythema with scattered partial thickness areas of tissue loss Wound ZWC:HENI, moist Drainage (amount, consistency, odor) small serous Periwound:erythema Dressing procedure/placement/frequency: I will discontinue the previously ordered POC in favor of using a moisture barrier ointment containing zinc oxide (triple Paste).  This will be topped with a silver hydrofiber (Aquacel Ag+ Advantage) fashioned as a split-gauze around the tube.  THis will be topped with an ABD pad and secured with paper tape.  The dressing is to be changed twice daily and PRN leakage of gastric contents onto the skin.  [facility] nursing team will not follow, but will remain available to this patient, the nursing and medical teams.  Please re-consult if needed. Thanks, Maudie Flakes, MSN, RN, [place], Arther Abbott  Pager# [phone]

## 2020-11-07 NOTE — Progress Notes (Signed)
PROGRESS NOTE   Subjective/Complaints: Had 1 loose BM this morning due to TF C/o 10/10 pain- just had abdominal wound dressing changed which was painful for her.  Says she is feeling "not the greatest"  ROS:  Pt denies SOB, abd pain, CP, N/V/C/D, and vision changes, 10/10 pain   Objective:   DG Abd 1 View  Result Date: 11/05/2020 CLINICAL DATA:  Peg tube placement EXAM: ABDOMEN - 1 VIEW COMPARISON:  None. FINDINGS: Omnipaque 300 injected shows filling of the stomach. No evidence of contrast leakage. Tube balloon is inflated. IMPRESSION: Gastrostomy tube well positioned. No evidence of contrast leakage. Electronically Signed   By: Nelson Chimes M.D.   On: 11/05/2020 14:14   IR GJ Tube Change  Result Date: 11/06/2020 INDICATION: 87 year-old female with a displaced percutaneous gastrostomy tube. Patient presents for replacement. EXAM: GASTRIC CATHETER REPLACEMENT MEDICATIONS: None ANESTHESIA/SEDATION: None CONTRAST:  31mL OMNIPAQUE IOHEXOL 300 MG/ML SOLN - administered into the gastric lumen. FLUOROSCOPY TIME:  Not recorded COMPLICATIONS: None immediate. PROCEDURE: The Foley catheter maintaining the tract was deflated and removed. A new 67 French balloon retention percutaneous gastrostomy tube was then lubricated and advanced through the stoma and into the stomach. The retention balloon was inflated and pulled snug against the anterior abdominal wall. The external bumper was fixed in place. A gentle hand injection of contrast material was performed followed by fluoroscopic saved image. Contrast outlines the stomach confirming intragastric placement. IMPRESSION: Successful replacement of dislodged 20 French percutaneous gastrostomy tube. Electronically Signed   By: Jacqulynn Cadet M.D.   On: 11/06/2020 10:02   Recent Labs    11/04/20 1302 11/05/20 0505  WBC 10.4 11.1*  HGB 11.1* 11.3*  HCT 34.9* 34.8*  PLT 288 332   Recent Labs     11/04/20 1302 11/05/20 0505  NA  --  133*  K  --  4.1  CL  --  98  CO2  --  28  GLUCOSE  --  102*  BUN  --  8  CREATININE 0.52 0.54  CALCIUM  --  8.8*    Intake/Output Summary (Last 24 hours) at 11/07/2020 0929 Last data filed at 11/06/2020 1851 Gross per 24 hour  Intake 350 ml  Output --  Net 350 ml     Pressure Injury 11/04/20 Sacrum Mid Stage 3 -  Full thickness tissue loss. Subcutaneous fat may be visible but bone, tendon or muscle are NOT exposed. Circular pencil eraser like hole on sacrum; initially documented 09/05/20 as stage 2 pressure area (Active)  11/04/20 1330  Location: Sacrum  Location Orientation: Mid  Staging: Stage 3 -  Full thickness tissue loss. Subcutaneous fat may be visible but bone, tendon or muscle are NOT exposed.  Wound Description (Comments): Circular pencil eraser like hole on sacrum; initially documented 09/05/20 as stage 2 pressure area  Present on Admission: Yes     Pressure Injury Buttocks Left Stage 2 -  Partial thickness loss of dermis presenting as a shallow open injury with a red, pink wound bed without slough. oval shaped denuded area (Active)     Location: Buttocks  Location Orientation: Left  Staging: Stage 2 -  Partial thickness  loss of dermis presenting as a shallow open injury with a red, pink wound bed without slough.  Wound Description (Comments): oval shaped denuded area  Present on Admission: Yes     Pressure Injury 11/04/20 Lumbar Lateral;Right Stage 1 -  Intact skin with non-blanchable redness of a localized area usually over a bony prominence. purewick coiled tubing shaped reddended non blancheable area on flank (Active)  11/04/20 1330  Location: Lumbar  Location Orientation: Lateral;Right  Staging: Stage 1 -  Intact skin with non-blanchable redness of a localized area usually over a bony prominence.  Wound Description (Comments): purewick coiled tubing shaped reddended non blancheable area on flank  Present on Admission: Yes     Physical Exam: Vital Signs Blood pressure 104/68, pulse 96, temperature 98.4 F (36.9 C), resp. rate 18, height 5\' 4"  (1.626 m), weight 35.5 kg, SpO2 95 %. Gen: no distress, normal appearing, cachetic HEENT: oral mucosa pink and moist, NCAT Cardio: Reg rate Chest: normal effort, normal rate of breathing Abd: soft, non-distended Ext: no edema Psych: pleasant, normal affect Musculoskeletal:     Cervical back: Normal range of motion and neck supple.     Comments: No edema or tenderness in extremities  Skin:    Comments: Stage III sacral ulcer Ulcer on left upper posterior pelvis Abdominal open wound C/D/I Increased drainage around PEG tube site Neurological:     Mental Status: She is alert.     Comments: Alert and oriented HOH Motor: B/l UE: 4+/5 proximal to distal RLE: 4/5 proximal to distal LLE: 4/5 HF, KE, 3-/5 ADF     Assessment/Plan: 1. Functional deficits which require 3+ hours per day of interdisciplinary therapy in a comprehensive inpatient rehab setting.  Physiatrist is providing close team supervision and 24 hour management of active medical problems listed below.  Physiatrist and rehab team continue to assess barriers to discharge/monitor patient progress toward functional and medical goals  Care Tool:  Bathing    Body parts bathed by patient: Right arm,Left arm,Chest,Abdomen,Front perineal area,Buttocks,Right upper leg,Left upper leg,Face   Body parts bathed by helper: Right lower leg,Left lower leg     Bathing assist Assist Level: Minimal Assistance - Patient > 75%     Upper Body Dressing/Undressing Upper body dressing   What is the patient wearing?: Pull over shirt    Upper body assist Assist Level: Minimal Assistance - Patient > 75%    Lower Body Dressing/Undressing Lower body dressing      What is the patient wearing?: Incontinence brief,Pants     Lower body assist Assist for lower body dressing: Maximal Assistance - Patient 25 - 49%      Toileting Toileting    Toileting assist Assist for toileting: Moderate Assistance - Patient 50 - 74% Assistive Device Comment: incontinent brief   Transfers Chair/bed transfer  Transfers assist  Chair/bed transfer activity did not occur: Safety/medical concerns        Locomotion Ambulation   Ambulation assist   Ambulation activity did not occur: Safety/medical concerns          Walk 10 feet activity   Assist  Walk 10 feet activity did not occur: Safety/medical concerns        Walk 50 feet activity   Assist Walk 50 feet with 2 turns activity did not occur: Safety/medical concerns         Walk 150 feet activity   Assist Walk 150 feet activity did not occur: Safety/medical concerns  Walk 10 feet on uneven surface  activity   Assist Walk 10 feet on uneven surfaces activity did not occur: Safety/medical concerns         Wheelchair     Assist Will patient use wheelchair at discharge?: Yes (pt will likely require WC at DC however pt unable to tolerate WC transfer or assessment on this date) Type of Wheelchair: Manual Wheelchair activity did not occur: Safety/medical concerns         Wheelchair 50 feet with 2 turns activity    Assist    Wheelchair 50 feet with 2 turns activity did not occur: Safety/medical concerns       Wheelchair 150 feet activity     Assist  Wheelchair 150 feet activity did not occur: Safety/medical concerns       Blood pressure 104/68, pulse 96, temperature 98.4 F (36.9 C), resp. rate 18, height 5\' 4"  (1.626 m), weight 35.5 kg, SpO2 95 %.  Medical Problem List and Plan: 1.  Debility secondary to abdominal wall cellulitis/sepsis complicated by chronic hypoxic respiratory failure/RA             -patient may not shower             -ELOS/Goals: 14-17 days/Min A             will change to 1x/day of PT and OT- and look for SNF bed- since so low function per pt and PT/OT  Continue CIR 2.   Antithrombotics: -DVT/anticoagulation: Lovenox             -antiplatelet therapy: N/A 3. Pain Management: Tylenol as needed  4/29- tried tramadol - didn't work- and try Norco 5/325 mg q4 hours prn  4/30: having 10/10 pain with dressing change, continue Norco and give 30 minutes prior to dressing change, Caryl Pina will pass onto oncoming nurse 4. Mood: Remeron 15 mg nightly             -antipsychotic agents: N/A 5. Neuropsych: This patient is capable of making decisions on her own behalf. 6. Skin/Wound Care: Routine skin checks.  Foam dressing to umbilicus and change every 3 days as needed soiling.  Apply saline moistened 2 x 2 to abdomen wound daily cover with dry 2 x 2 and tape.  Apply dry split thickness gauze underneath G-tube daily.  4/30: reconsulted wound care as PEG site with some leaking TF 7. Fluids/Electrolytes/Nutrition: Routine in and outs.             CMP ordered 8.  Rheumatoid arthritis.  Continue prednisone. 9.  Chronic hypoxic respiratory failure.  Oxygen therapy as directed continue chronic prednisone.             Monitor O2 sats with increased exertion 10.  Dysphagia.  Dysphagia #1 thin liquids.  G-tube replaced 10/29/2020 per interventional radiology.  Dietary follow-up             Advance diet as tolerated  4/29- PEG- fell out- got back in by radiology-but has high risk of coming out- came out with balloon intact- con't to monitor  11.  Thrush/mouth pain/odynophagia.  Follow Dr. Benson Norway  4/29- try Norco/Oxycodone- 5/325 mg Q4 hours prn- thrush appears to be improved. If doesn't improve, will change to Oxycodone- has no bad reactions to pain meds in past.  12. Severe malnutrition  4/29- con't PEG; TFS nightly- BMI 13.6- con't to push PO- but limited by gum pain.  13. Hypotension-   4/29- BP running 80s/40s-will try Low dose Midodrine-  2.5 mg TID with meals- for lower BP.  14. Loose stools: likely 2/2 TF, continue to monitor    LOS: 3 days A FACE TO FACE EVALUATION WAS  PERFORMED  Clide Deutscher Zehava Turski 11/07/2020, 9:29 AM

## 2020-11-07 NOTE — Consult Note (Signed)
Consultation Note Date: 11/07/2020   Patient Name: [name] [name]  DOB: Sep 07, 1930  MRN: [id number]  Age / Sex: 87 y.o., female  PCP: Hoyt Koch, MD Referring Physician: Courtney Heys, MD  Reason for Consultation: Establishing goals of care  HPI/Patient Profile: 87 y.o. female  with past medical history of UIP on 2L O2, chronic prednisone, RA, history of DVT/PE, mild CAD, and SBO s/p ex lap February 2022 with G tube placement since removed admitted on 11/04/2020 to CIR following a hospitalization 4/15-4/27 for abdominal pain and wound drainage. She was admitted for a few weeks in February - March 7482 with complications after exploratory laparotomy requiring TPN and G tube placement which was ultimately removed, the patient discharge to SNF for less than 2 weeks, then returned home with 24 hours care. During hospitalization 4/15-27, CT of abdomen pelvis showed no acute process in the abdomen or pelvis. General surgery consulted initially with wound VAC later discontinued changed to wet-to-dry dressings.  Due to patient's poor p.o. intake interventional radiology consulted as well as ongoing significant dysphagia, G-tube was placed 10/29/2020 per interventional radiology.  Patient did have some thrush and mouth pain CT maxillofacial showed fairly diffuse mucosal enhancement within the oral cavity likely reflecting sequela of reported thrush/mucositis.  Dr.Owsley consulted and felt her lower lip was sinking into her lower implants causing some ulceration of both sides of her inner lip and maintained on chlorhexidine. In CIR, patient has not tolerated therapy well. Continues to have abdominal pain. Feeding tube fell out with balloon inflated on 4/28 - has since been replaced. PMT has been reconsulted to further discuss [place].   Clinical Assessment and Goals of Care: I have reviewed medical records including EPIC notes, labs and imaging, assessed the  patient and then met with patient  to discuss diagnosis prognosis, GOC, EOL wishes, disposition and options.  I introduced Palliative Medicine as specialized medical care for people living with serious illness. It focuses on providing relief from the symptoms and stress of a serious illness. The goal is to improve quality of life for both the patient and the family.  Patient is familiar with our services and not interested in speaking much - she has just endured a dressing change and having abdominal pain.   She does share with me that she feels unable to tolerate the 3 hours of therapy/day and she needs this to be reduced. She also confirms that her sister in law [name] [name] is her 5. When I attempt to discuss her [place] further she tells me she cannot talk about it anymore.  Patient does give me permission to call [name] [name], her HCPOA. [name] [name] provides excellent amount of background information and plans for future. [name] [name] tells me family is currently arranging for 24 hour care for patient for her return home. They are very grateful for the care patient is receiving in CIR and hopeful she can continue her CIR stay with reduced therapy hours. When discussing goals of care, [name] [name] tells me patient has continued to express desires for full scope interventions - patient is interested in any medical interventions offered to prolong life. We discuss her frailty, debility, and malnutrition at length. [name] [name] has excellent understanding of situation. Family is motivated to provide whatever is necessary to continue to meet Ms. Wieczorek's stated goals.   Discussed with patient/family the importance of continued conversation with family and the medical providers regarding overall plan of care and treatment options, ensuring decisions are within the context of the patient's values  and GOCs.    Palliative Care services outpatient were explained and offered. Patient has been setup with outpatient palliative and has an appointment scheduled  with them.   Questions and concerns were addressed. The family was encouraged to call with questions or concerns.   Primary Decision Maker PATIENT  HCPOA [name] [name] if patient unable  SUMMARY OF RECOMMENDATIONS   Wishes have not changed from previous PMT encounters - Interested in full code/full scope measures Setup with outpatient palliative     Primary Diagnoses: Present on Admission: . Debility   I have reviewed the medical record, interviewed the patient and family, and examined the patient. The following aspects are pertinent.  Past Medical History:  Diagnosis Date  . Allergic rhinitis   . Allergy   . CAD (coronary artery disease)    Mild CAD by cath 2008  . CHF (congestive heart failure) ([place])   . DJD (degenerative joint disease)    rheumatoid  . DVT (deep venous thrombosis) ([place])   . Dyslipidemia   . History of echocardiogram    Echo 12/2018: EF 60-65, mod asymmetric LVH, Gr 1 DD  . History of nuclear stress test    Myoview 5/17:  EF 74%, normal perfusion, low risk // Myoview 12/2018:  EF 89, very mild ischemia in inferoapical wall; Low Risk  . History of pulmonary embolism   . Hyperlipidemia   . Insomnia   . Osteoporosis   . Psoriasis   . Pulmonary fibrosis ([place])   . Rheumatoid arthritis ([place])    Social History   Socioeconomic History  . Marital status: Widowed    Spouse name: Not on file  . Number of children: 2  . Years of education: Not on file  . Highest education level: Not on file  Occupational History  . Occupation: Publishing rights manager    Comment: retired  Tobacco Use  . Smoking status: Former Smoker    Packs/day: 0.50    Years: 20.00    Pack years: 10.00    Types: Cigarettes    Quit date: 07/11/1973    Years since quitting: 47.3  . Smokeless tobacco: Never Used  Vaping Use  . Vaping Use: Never used  Substance and Sexual Activity  . Alcohol use: No  . Drug use: No  . Sexual activity: Never  Other Topics Concern  . Not on file   Social History Narrative   Diet:   Do you drink/eat things with caffeine? Yes   Marital status:        Widowed                      What year were you married?1954   Do you live in a house, apartment, assisted living, condo, trailer, etc)? House   Is it one or more stories? 1 1/4   How many persons live in your home?1   Do you have any pets in your home? No   Current or past profession:  Publishing rights manager   Do you exercise?        Yes                                            Type & how often:  Walk  Do you have a living will? Yes   Do you have a DNR Form? Yes   Do you have a POA/HPOA forms?  Yes   Social Determinants of Health   Financial Resource Strain: Low Risk   . Difficulty of Paying Living Expenses: Not hard at all  Food Insecurity: No Food Insecurity  . Worried About Charity fundraiser in the Last Year: Never true  . Ran Out of Food in the Last Year: Never true  Transportation Needs: No Transportation Needs  . Lack of Transportation (Medical): No  . Lack of Transportation (Non-Medical): No  Physical Activity: Not on file  Stress: Not on file  Social Connections: Moderately Integrated  . Frequency of Communication with Friends and Family: More than three times a week  . Frequency of Social Gatherings with Friends and Family: More than three times a week  . Attends Religious Services: 1 to 4 times per year  . Active Member of Clubs or Organizations: Yes  . Attends Archivist Meetings: Never  . Marital Status: Widowed   Family History  Problem Relation Age of Onset  . Kidney disease Daughter 5  . Cancer Daughter   . Heart attack Father 53  . Alzheimer's disease Sister   . Heart disease Sister   . Alzheimer's disease Sister    Scheduled Meds: . chlorhexidine  15 mL Mouth/Throat QID  . enoxaparin (LOVENOX) injection  20 mg Subcutaneous Q24H  . famotidine  20 mg Per Tube Daily  . feeding supplement  1  Container Oral BID WC  . feeding supplement  237 mL Oral Q24H  . feeding supplement (OSMOLITE 1.5 CAL)  840 mL Per Tube Q24H  . feeding supplement (PROSource TF)  45 mL Per Tube Daily  . fluconazole  100 mg Oral Daily  . insulin aspart  0-6 Units Subcutaneous Q4H  . lipase/protease/amylase  36,000 Units Oral TID AC  . magic mouthwash w/lidocaine  5 mL Oral TID  . midodrine  2.5 mg Oral TID WC  . mirtazapine  15 mg Oral QHS  . multivitamin with minerals  1 tablet Oral Daily  . mycophenolate  1,000 mg Per Tube BID  . Nintedanib  100 mg Oral BID  . predniSONE  5 mg Oral Q breakfast   Continuous Infusions: PRN Meds:.acetaminophen **OR** acetaminophen, benzocaine, fluticasone, HYDROcodone-acetaminophen, ondansetron **OR** ondansetron (ZOFRAN) IV Allergies  Allergen Reactions  . Crestor [Rosuvastatin] Other (See Comments)    Muscle pain  . Lactose Intolerance (Gi) Other (See Comments)    Stomach upset  . Penicillins Hives  . Imuran [Azathioprine] Nausea And Vomiting   Review of Systems  Gastrointestinal: Positive for abdominal pain.    Physical Exam Constitutional:      Appearance: She is ill-appearing.     Comments: frail  Pulmonary:     Effort: Pulmonary effort is normal.  Musculoskeletal:     Right lower leg: No edema.     Left lower leg: No edema.  Skin:    General: Skin is warm and dry.  Neurological:     Mental Status: She is alert and oriented to person, place, and time.  Psychiatric:        Behavior: Behavior is withdrawn.     Vital Signs: BP 104/68 (BP Location: Right Arm)   Pulse 96   Temp 98.4 F (36.9 C)   Resp 18   Ht $R'5\' 4"'Eo$  (1.626 m)   Wt 35.5 kg   SpO2 95%   BMI 13.43  kg/m  Pain Scale: 0-10   Pain Score: 5    SpO2: SpO2: 95 % O2 Device:SpO2: 95 % O2 Flow Rate: .O2 Flow Rate (L/min): 2 L/min  IO: Intake/output summary:   Intake/Output Summary (Last 24 hours) at 11/07/2020 1237 Last data filed at 11/06/2020 1851 Gross per 24 hour  Intake 350  ml  Output --  Net 350 ml    LBM: Last BM Date: 11/06/20 Baseline Weight: Weight: 36.3 kg Most recent weight: Weight: 35.5 kg     Palliative Assessment/Data: PPS 40%    Time Total: 55 minutes Greater than 50%  of this time was spent counseling and coordinating care related to the above assessment and plan.  Juel Burrow, DNP, AGNP-C Palliative Medicine Team [phone] Pager: [phone]

## 2020-11-08 LAB — GLUCOSE, CAPILLARY
Glucose-Capillary: 101 mg/dL — ABNORMAL HIGH (ref 70–99)
Glucose-Capillary: 107 mg/dL — ABNORMAL HIGH (ref 70–99)
Glucose-Capillary: 110 mg/dL — ABNORMAL HIGH (ref 70–99)
Glucose-Capillary: 111 mg/dL — ABNORMAL HIGH (ref 70–99)
Glucose-Capillary: 116 mg/dL — ABNORMAL HIGH (ref 70–99)
Glucose-Capillary: 118 mg/dL — ABNORMAL HIGH (ref 70–99)

## 2020-11-08 MED ORDER — TAB-A-VITE/IRON PO TABS
1.0000 | ORAL_TABLET | Freq: Every day | ORAL | Status: DC
  Administered 2020-11-08 – 2020-11-23 (×16): 1 via ORAL
  Filled 2020-11-08 (×18): qty 1

## 2020-11-08 MED ORDER — OXYCODONE HCL 5 MG PO TABS
5.0000 mg | ORAL_TABLET | ORAL | Status: DC | PRN
  Administered 2020-11-08 – 2020-11-19 (×27): 5 mg via ORAL
  Filled 2020-11-08 (×28): qty 1

## 2020-11-08 NOTE — Progress Notes (Signed)
Physical Therapy Note  Patient Details  Name: [name] [name] MRN: [id number] Date of Birth: 12-May-1931 Today's Date: 11/08/2020    Attempted to see patient for scheduled therapy session. Pt reports abdominal pain and declines to sit up to EOB. Pt reports just receiving pain medication prior to start of session. Encouraged pt to participate in bed-level exercises to improve strength and endurance, pt declines at this time due to fatigue. Pt agreeable to attempt if therapist returns this PM. Will follow up as able. Pt missed 45 min of scheduled therapy session due to pain and fatigue.    Excell Seltzer, PT, DPT, CSRS  11/08/2020, 11:54 AM

## 2020-11-08 NOTE — Plan of Care (Signed)
  Problem: Consults Goal: RH GENERAL PATIENT EDUCATION Description: See Patient Education module for education specifics. Outcome: Progressing Goal: Skin Care Protocol Initiated - if Braden Score 18 or less Description: If consults are not indicated, leave blank or document N/A Outcome: Progressing Goal: Diabetes Guidelines if Diabetic/Glucose > 140 Description: If diabetic or lab glucose is > 140 mg/dl - Initiate Diabetes/Hyperglycemia Guidelines & Document Interventions  Outcome: Progressing   Problem: RH BOWEL ELIMINATION Goal: RH STG MANAGE BOWEL WITH ASSISTANCE Description: STG Manage Bowel with Min Assistance. Outcome: Progressing   Problem: RH BLADDER ELIMINATION Goal: RH STG MANAGE BLADDER WITH ASSISTANCE Description: STG Manage Bladder With Min Assistance Outcome: Progressing   Problem: RH SKIN INTEGRITY Goal: RH STG SKIN FREE OF INFECTION/BREAKDOWN Description: Skin to remain free from additional breakdown while on rehab with min assist. Outcome: Progressing Goal: RH STG MAINTAIN SKIN INTEGRITY WITH ASSISTANCE Description: STG Maintain Skin Integrity With Min Assistance. Outcome: Progressing Goal: RH STG ABLE TO PERFORM INCISION/WOUND CARE W/ASSISTANCE Description: STG Able To Perform Incision/Wound Care With Min Assistance. Outcome: Progressing   Problem: RH SAFETY Goal: RH STG ADHERE TO SAFETY PRECAUTIONS W/ASSISTANCE/DEVICE Description: STG Adhere to Safety Precautions With Min Assistance/Device. Outcome: Progressing   Problem: RH PAIN MANAGEMENT Goal: RH STG PAIN MANAGED AT OR BELOW PT'S PAIN GOAL Description: < 4 on a 0-10 pain scale. Outcome: Progressing   Problem: RH KNOWLEDGE DEFICIT GENERAL Goal: RH STG INCREASE KNOWLEDGE OF SELF CARE AFTER HOSPITALIZATION Description: Patient will be able to demonstrate knowledge of medication management, diabetes management, wound care management, bowel/bladder management will educational materials and handouts  provided by staff, at discharge independently. Outcome: Progressing   

## 2020-11-08 NOTE — Progress Notes (Signed)
Occupational Therapy Session Note  Patient Details  Name: [name] [name] MRN: [id number] Date of Birth: [birth year]  Today's Date: 11/08/2020 OT Individual Time: 1450-1500 OT Individual Time Calculation (min): 10 min  35 minutes missed  Short Term Goals: Week 1:  OT Short Term Goal 1 (Week 1): Pt will complete LB dressing with Mod A OT Short Term Goal 2 (Week 1): Pt will complete 1 grooming task while standing at the sink to increase standing endurance OT Short Term Goal 3 (Week 1): Pt will complete sit<stand during toileting with Min A and LRAD  Skilled Therapeutic Interventions/Progress Updates:    Pt greeted in bed, appearing very lethargic with k-pad on her stomach. NT assessed vitals with BP 95/64 and 02 sats 97% on RA. Pts nasal cannula was on the bed, pt stating she took it off. She reported having 9/10 pain in her abdomen/PEG site. Felt very tired, declined participation in all therapeutic or self care activity. Suggested bedlevel exercises or OT education but pt adamant that she needed to rest for now. After some encouragement and light conversation, pt still refusing to participate and lethargy not seeming to change. Notified RN of pts request for pain medicine. Time missed due to pt fatigue/pain/refusal.     Therapy Documentation Precautions:  Precautions Precautions: Fall Precaution Comments: abdominal binder to protect PEG site, oral thrush/mucositis and mouth ulceration, 2L 02 Restrictions Weight Bearing Restrictions: No Vital Signs: Therapy Vitals Temp: 98.6 F (37 C) Pulse Rate: 87 Resp: 17 BP: 95/64 Patient Position (if appropriate): Lying Oxygen Therapy SpO2: 97 % O2 Device: Room Air   ADL: ADL Eating: Not assessed Grooming: Setup Where Assessed-Grooming: Edge of bed Upper Body Bathing: Setup Where Assessed-Upper Body Bathing: Edge of bed Lower Body Bathing: Minimal assistance Where Assessed-Lower Body Bathing: Other (Comment) (BSC) Upper Body Dressing:  Minimal assistance Where Assessed-Upper Body Dressing: Other (Comment) (sitting on BSC) Lower Body Dressing: Dependent Where Assessed-Lower Body Dressing: Other (Comment) (sit<stand from [facility]) Toileting: Moderate assistance Where Assessed-Toileting: Bedside Commode Toilet Transfer: Moderate assistance Toilet Transfer Method: Stand pivot (without AD) Toilet Transfer Equipment: Bedside commode Tub/Shower Transfer: Not assessed :     Therapy/Group: Individual Therapy  Tijuan Dantes A Denym Rahimi 11/08/2020, 3:33 PM

## 2020-11-08 NOTE — Progress Notes (Signed)
PROGRESS NOTE   Subjective/Complaints: Refused therapy today due to pain and fatigue. Using prn norco regularly. AST elevated so will not schedule tylenol. Will switch Norco to oxycodone for better pain control/less tylenol usage.  Iron level low on 4/26 and may be contributing to fatigue- will switch multivitamin to one with iron supplement  ROS:  Pt denies SOB, abd pain, CP, N/V/C/D, and vision changes, +10/10 pain, +fatigue   Objective:   No results found. No results for input(s): WBC, HGB, HCT, PLT in the last 72 hours. No results for input(s): NA, K, CL, CO2, GLUCOSE, BUN, CREATININE, CALCIUM in the last 72 hours.  Intake/Output Summary (Last 24 hours) at 11/08/2020 1244 Last data filed at 11/07/2020 1345 Gross per 24 hour  Intake 0 ml  Output --  Net 0 ml     Pressure Injury 11/04/20 Sacrum Mid Stage 3 -  Full thickness tissue loss. Subcutaneous fat may be visible but bone, tendon or muscle are NOT exposed. Circular pencil eraser like hole on sacrum; initially documented 09/05/20 as stage 2 pressure area (Active)  11/04/20 1330  Location: Sacrum  Location Orientation: Mid  Staging: Stage 3 -  Full thickness tissue loss. Subcutaneous fat may be visible but bone, tendon or muscle are NOT exposed.  Wound Description (Comments): Circular pencil eraser like hole on sacrum; initially documented 09/05/20 as stage 2 pressure area  Present on Admission: Yes     Pressure Injury Buttocks Left Stage 2 -  Partial thickness loss of dermis presenting as a shallow open injury with a red, pink wound bed without slough. oval shaped denuded area (Active)     Location: Buttocks  Location Orientation: Left  Staging: Stage 2 -  Partial thickness loss of dermis presenting as a shallow open injury with a red, pink wound bed without slough.  Wound Description (Comments): oval shaped denuded area  Present on Admission: Yes     Pressure Injury  11/04/20 Lumbar Lateral;Right Stage 1 -  Intact skin with non-blanchable redness of a localized area usually over a bony prominence. purewick coiled tubing shaped reddended non blancheable area on flank (Active)  11/04/20 1330  Location: Lumbar  Location Orientation: Lateral;Right  Staging: Stage 1 -  Intact skin with non-blanchable redness of a localized area usually over a bony prominence.  Wound Description (Comments): purewick coiled tubing shaped reddended non blancheable area on flank  Present on Admission: Yes    Physical Exam: Vital Signs Blood pressure 103/68, pulse 85, temperature 97.8 F (36.6 C), temperature source Oral, resp. rate 20, height 5\' 4"  (1.626 m), weight 35.4 kg, SpO2 99 %. Gen: no distress, normal appearing HEENT: oral mucosa pink and moist, NCAT Cardio: Reg rate Chest: normal effort, normal rate of breathing Abd: soft, non-distended Ext: no edema Psych: pleasant, normal affect Musculoskeletal:     Cervical back: Normal range of motion and neck supple.     Comments: No edema or tenderness in extremities  Skin:    Comments: Stage III sacral ulcer Ulcer on left upper posterior pelvis Abdominal open wound C/D/I Increased drainage around PEG tube site Neurological:     Mental Status: She is alert.  Comments: Alert and oriented HOH Motor: B/l UE: 4+/5 proximal to distal RLE: 4/5 proximal to distal LLE: 4/5 HF, KE, 3-/5 ADF     Assessment/Plan: 1. Functional deficits which require 3+ hours per day of interdisciplinary therapy in a comprehensive inpatient rehab setting.  Physiatrist is providing close team supervision and 24 hour management of active medical problems listed below.  Physiatrist and rehab team continue to assess barriers to discharge/monitor patient progress toward functional and medical goals  Care Tool:  Bathing    Body parts bathed by patient: Right arm,Left arm,Chest,Abdomen,Front perineal area,Buttocks,Right upper leg,Left  upper leg,Face   Body parts bathed by helper: Right lower leg,Left lower leg     Bathing assist Assist Level: Minimal Assistance - Patient > 75%     Upper Body Dressing/Undressing Upper body dressing   What is the patient wearing?: Pull over shirt    Upper body assist Assist Level: Minimal Assistance - Patient > 75%    Lower Body Dressing/Undressing Lower body dressing      What is the patient wearing?: Incontinence brief,Pants     Lower body assist Assist for lower body dressing: Maximal Assistance - Patient 25 - 49%     Toileting Toileting    Toileting assist Assist for toileting: Moderate Assistance - Patient 50 - 74% Assistive Device Comment: incontinent brief   Transfers Chair/bed transfer  Transfers assist  Chair/bed transfer activity did not occur: Safety/medical concerns        Locomotion Ambulation   Ambulation assist   Ambulation activity did not occur: Safety/medical concerns          Walk 10 feet activity   Assist  Walk 10 feet activity did not occur: Safety/medical concerns        Walk 50 feet activity   Assist Walk 50 feet with 2 turns activity did not occur: Safety/medical concerns         Walk 150 feet activity   Assist Walk 150 feet activity did not occur: Safety/medical concerns         Walk 10 feet on uneven surface  activity   Assist Walk 10 feet on uneven surfaces activity did not occur: Safety/medical concerns         Wheelchair     Assist Will patient use wheelchair at discharge?: Yes (pt will likely require WC at DC however pt unable to tolerate WC transfer or assessment on this date) Type of Wheelchair: Manual Wheelchair activity did not occur: Safety/medical concerns         Wheelchair 50 feet with 2 turns activity    Assist    Wheelchair 50 feet with 2 turns activity did not occur: Safety/medical concerns       Wheelchair 150 feet activity     Assist  Wheelchair 150 feet  activity did not occur: Safety/medical concerns       Blood pressure 103/68, pulse 85, temperature 97.8 F (36.6 C), temperature source Oral, resp. rate 20, height 5\' 4"  (1.626 m), weight 35.4 kg, SpO2 99 %.  Medical Problem List and Plan: 1.  Debility secondary to abdominal wall cellulitis/sepsis complicated by chronic hypoxic respiratory failure/RA             -patient may not shower             -ELOS/Goals: 14-17 days/Min A             will change to 1x/day of PT and OT- and look for SNF bed- since so low  function per pt and PT/OT  Continue CIR 2.  Impaired mobility -DVT/anticoagulation: Continue Lovenox             -antiplatelet therapy: N/A 3. Pain Management: Tylenol as needed  4/29- tried tramadol - didn't work- and try Norco 5/325 mg q4 hours prn  4/30: having 10/10 pain with dressing change, continue Norco and give 30 minutes prior to dressing change, Caryl Pina will pass onto oncoming nurse  5/1: AST elevated and pain prohibiting participation with therapy. Switch to oxycodone for better pain control and less tylenol.  4. Mood: Remeron 15 mg nightly             -antipsychotic agents: N/A 5. Neuropsych: This patient is capable of making decisions on her own behalf. 6. Skin/Wound Care: Routine skin checks.  Foam dressing to umbilicus and change every 3 days as needed soiling.  Apply saline moistened 2 x 2 to abdomen wound daily cover with dry 2 x 2 and tape.  Apply dry split thickness gauze underneath G-tube daily.  4/30: reconsulted wound care as PEG site with some leaking TF 7. Fluids/Electrolytes/Nutrition: Routine in and outs.             CMP ordered 8.  Rheumatoid arthritis.  Continue prednisone. 9.  Chronic hypoxic respiratory failure.  Oxygen therapy as directed continue chronic prednisone.             Monitor O2 sats with increased exertion 10.  Dysphagia.  Dysphagia #1 thin liquids.  G-tube replaced 10/29/2020 per interventional radiology.  Dietary follow-up              Advance diet as tolerated  4/29- PEG- fell out- got back in by radiology-but has high risk of coming out- came out with balloon intact- con't to monitor  11.  Thrush/mouth pain/odynophagia.  Follow Dr. Benson Norway  4/29- try Norco/Oxycodone- 5/325 mg Q4 hours prn- thrush appears to be improved. If doesn't improve, will change to Oxycodone- has no bad reactions to pain meds in past.  12. Severe malnutrition  4/29- con't PEG; TFS nightly- BMI 13.6- con't to push PO- but limited by gum pain.  13. Hypotension-   4/29- BP running 80s/40s-will try Low dose Midodrine- 2.5 mg TID with meals- for lower BP.  14. Loose stools: likely 2/2 TF, continue to monitor 15. Iron deficiency: started iron supplement    LOS: 4 days A FACE TO FACE EVALUATION WAS PERFORMED  [name] [name] 11/08/2020, 12:44 PM

## 2020-11-09 ENCOUNTER — Ambulatory Visit: Payer: Medicare Other | Admitting: Internal Medicine

## 2020-11-09 LAB — GLUCOSE, CAPILLARY
Glucose-Capillary: 106 mg/dL — ABNORMAL HIGH (ref 70–99)
Glucose-Capillary: 120 mg/dL — ABNORMAL HIGH (ref 70–99)
Glucose-Capillary: 131 mg/dL — ABNORMAL HIGH (ref 70–99)
Glucose-Capillary: 144 mg/dL — ABNORMAL HIGH (ref 70–99)
Glucose-Capillary: 96 mg/dL (ref 70–99)

## 2020-11-09 NOTE — Progress Notes (Addendum)
PROGRESS NOTE   Subjective/Complaints:   Pt reports doesn't really "want to do therapy".  Insisted with palliative care she is full code and considers "everything needs to be done".   Pt reports slept on and off last night- did get TFs overnight- didn't eat more than 2-3 bites of breakfast this AM.   Assessed backside- has 3 small stage II ulcers- none that are stage III or IV.   Pt reports pain is still bad in gums- nursing thought to give tylenol- I explained to give oxy please.   ROS:   Pt denies SOB, abd pain, CP, N/V/C/D, and vision changes   Objective:   No results found. No results for input(s): WBC, HGB, HCT, PLT in the last 72 hours. No results for input(s): NA, K, CL, CO2, GLUCOSE, BUN, CREATININE, CALCIUM in the last 72 hours.  Intake/Output Summary (Last 24 hours) at 11/09/2020 1921 Last data filed at 11/09/2020 4580 Gross per 24 hour  Intake 960 ml  Output --  Net 960 ml     Pressure Injury 11/04/20 Sacrum Mid Stage 3 -  Full thickness tissue loss. Subcutaneous fat may be visible but bone, tendon or muscle are NOT exposed. Circular pencil eraser like hole on sacrum; initially documented 09/05/20 as stage 2 pressure area (Active)  11/04/20 1330  Location: Sacrum  Location Orientation: Mid  Staging: Stage 3 -  Full thickness tissue loss. Subcutaneous fat may be visible but bone, tendon or muscle are NOT exposed.  Wound Description (Comments): Circular pencil eraser like hole on sacrum; initially documented 09/05/20 as stage 2 pressure area  Present on Admission: Yes     Pressure Injury Buttocks Left Stage 2 -  Partial thickness loss of dermis presenting as a shallow open injury with a red, pink wound bed without slough. oval shaped denuded area (Active)     Location: Buttocks  Location Orientation: Left  Staging: Stage 2 -  Partial thickness loss of dermis presenting as a shallow open injury with a red, pink  wound bed without slough.  Wound Description (Comments): oval shaped denuded area  Present on Admission: Yes     Pressure Injury 11/04/20 Lumbar Lateral;Right Stage 1 -  Intact skin with non-blanchable redness of a localized area usually over a bony prominence. purewick coiled tubing shaped reddended non blancheable area on flank (Active)  11/04/20 1330  Location: Lumbar  Location Orientation: Lateral;Right  Staging: Stage 1 -  Intact skin with non-blanchable redness of a localized area usually over a bony prominence.  Wound Description (Comments): purewick coiled tubing shaped reddended non blancheable area on flank  Present on Admission: Yes    Physical Exam: Vital Signs Blood pressure 96/67, pulse 86, temperature 98.1 F (36.7 C), resp. rate 19, height 5\' 4"  (1.626 m), weight 36.8 kg, SpO2 95 %.   General asleep- woke with difficulty; crying with being turned, simply to side; by nursing;  NAD HENT: conjugate gaze; oropharynx dry- hasn't eaten- gums don't appear to look inflamed CV: regular rate; no JVD Pulmonary: CTA B/L; no W/R/R- good air movement GI: soft, NT, ND, (+)BS- cachetic- BMI 13! PEG in place Psychiatric: tearful with simple turning- refusing therapy- wet/stool  in brief- being changed by nursing Neurological: less alert today  Musculoskeletal:     Cervical back: Normal range of motion and neck supple.     Comments: No edema or tenderness in extremities  Skin:    Comments: Stage II 3 tiny spots -2 on coccyx, 1 on inner L buttock Ulcer on left upper posterior pelvis Abdominal open wound C/D/I- not assessed today, but is C/D/I today  Neurological:     Mental Status: She is alert.     Comments: Alert and oriented HOH Motor: B/l UE: 4+/5 proximal to distal RLE: 4/5 proximal to distal LLE: 4/5 HF, KE, 3-/5 ADF     Assessment/Plan: 1. Functional deficits which require 3+ hours per day of interdisciplinary therapy in a comprehensive inpatient rehab  setting.  Physiatrist is providing close team supervision and 24 hour management of active medical problems listed below.  Physiatrist and rehab team continue to assess barriers to discharge/monitor patient progress toward functional and medical goals  Care Tool:  Bathing    Body parts bathed by patient: Right arm,Left arm,Chest,Abdomen,Front perineal area,Buttocks,Right upper leg,Left upper leg,Face   Body parts bathed by helper: Right lower leg,Left lower leg     Bathing assist Assist Level: Minimal Assistance - Patient > 75%     Upper Body Dressing/Undressing Upper body dressing   What is the patient wearing?: Pull over shirt    Upper body assist Assist Level: Minimal Assistance - Patient > 75%    Lower Body Dressing/Undressing Lower body dressing      What is the patient wearing?: Incontinence brief,Pants     Lower body assist Assist for lower body dressing: Maximal Assistance - Patient 25 - 49%     Toileting Toileting    Toileting assist Assist for toileting: Moderate Assistance - Patient 50 - 74% Assistive Device Comment: incontinent brief   Transfers Chair/bed transfer  Transfers assist  Chair/bed transfer activity did not occur: Safety/medical concerns        Locomotion Ambulation   Ambulation assist   Ambulation activity did not occur: Safety/medical concerns          Walk 10 feet activity   Assist  Walk 10 feet activity did not occur: Safety/medical concerns        Walk 50 feet activity   Assist Walk 50 feet with 2 turns activity did not occur: Safety/medical concerns         Walk 150 feet activity   Assist Walk 150 feet activity did not occur: Safety/medical concerns         Walk 10 feet on uneven surface  activity   Assist Walk 10 feet on uneven surfaces activity did not occur: Safety/medical concerns         Wheelchair     Assist Will patient use wheelchair at discharge?: Yes (pt will likely require WC  at DC however pt unable to tolerate WC transfer or assessment on this date) Type of Wheelchair: Manual Wheelchair activity did not occur: Safety/medical concerns         Wheelchair 50 feet with 2 turns activity    Assist    Wheelchair 50 feet with 2 turns activity did not occur: Safety/medical concerns       Wheelchair 150 feet activity     Assist  Wheelchair 150 feet activity did not occur: Safety/medical concerns       Blood pressure 96/67, pulse 86, temperature 98.1 F (36.7 C), resp. rate 19, height 5\' 4"  (1.626 m), weight 36.8  kg, SpO2 95 %.  Medical Problem List and Plan: 1.  Debility secondary to abdominal wall cellulitis/sepsis complicated by chronic hypoxic respiratory failure/RA             -patient may not shower             -ELOS/Goals: 14-17 days/Min A             will change to 1x/day of PT and OT- and look for SNF bed- since so low function per pt and PT/OT  COn't PT and OT as tolerated- will d/w team- SNF likely 2.  Impaired mobility -DVT/anticoagulation: Continue Lovenox             -antiplatelet therapy: N/A 3. Pain Management: Tylenol as needed  4/29- tried tramadol - didn't work- and try Norco 5/325 mg q4 hours prn  4/30: having 10/10 pain with dressing change, continue Norco and give 30 minutes prior to dressing change, Caryl Pina will pass onto oncoming nurse  5/1: AST elevated and pain prohibiting participation with therapy. Switch to oxycodone for better pain control and less tylenol.   5/2- AST is 42- very slightly elevated, likely from frequent tylenol usage- will ask nursing to use Oxy and not tylenol for pain.  4. Mood: Remeron 15 mg nightly             -antipsychotic agents: N/A 5. Neuropsych: This patient is capable of making decisions on her own behalf. 6. Skin/Wound Care: Routine skin checks.  Foam dressing to umbilicus and change every 3 days as needed soiling.  Apply saline moistened 2 x 2 to abdomen wound daily cover with dry 2 x 2 and  tape.  Apply dry split thickness gauze underneath G-tube daily.  4/30: reconsulted wound care as PEG site with some leaking TF  5/2- said it's due to fistula- won't follow, but remain available. Also has 3 Stage II small spots on coccyx x2 and 1 on L inner buttock- con't wound care/foam 7. Fluids/Electrolytes/Nutrition: Routine in and outs.             CMP ordered 8.  Rheumatoid arthritis.  Continue prednisone. 9.  Chronic hypoxic respiratory failure.  Oxygen therapy as directed continue chronic prednisone.             Monitor O2 sats with increased exertion 10.  Dysphagia.  Dysphagia #1 thin liquids.  G-tube replaced 10/29/2020 per interventional radiology.  Dietary follow-up             Advance diet as tolerated  4/29- PEG- fell out- got back in by radiology-but has high risk of coming out- came out with balloon intact- con't to monitor  11.  Thrush/mouth pain/odynophagia.  Follow Dr. Benson Norway  4/29- try Norco/Oxycodone- 5/325 mg Q4 hours prn- thrush appears to be improved. If doesn't improve, will change to Oxycodone- has no bad reactions to pain meds in past.   5/2- con't oxy prn- don't use tylenol 12. Severe malnutrition  4/29- con't PEG; TFS nightly- BMI 13.6- con't to push PO- but limited by gum pain.   5/2- BMI up to 13.93-  13. Hypotension-   4/29- BP running 80s/40s-will try Low dose Midodrine- 2.5 mg TID with meals- for lower BP.  14. Loose stools: likely 2/2 TF, continue to monitor 15. Iron deficiency: started iron supplement    LOS: 5 days A FACE TO FACE EVALUATION WAS PERFORMED  [name] [name] 11/09/2020, 7:21 PM

## 2020-11-09 NOTE — Progress Notes (Signed)
Occupational Therapy Session Note  Patient Details  Name: [name] [name] MRN: [id number] Date of Birth: [birth year]  Today's Date: 11/09/2020 OT Individual Time: 1535-1620   OT Individual Time Calculation (min): 45 min    Short Term Goals: Week 1:  OT Short Term Goal 1 (Week 1): Pt will complete LB dressing with Mod A OT Short Term Goal 2 (Week 1): Pt will complete 1 grooming task while standing at the sink to increase standing endurance OT Short Term Goal 3 (Week 1): Pt will complete sit<stand during toileting with Min A and LRAD   Skilled Therapeutic Interventions/Progress Updates:    Pt greeted at time of session agreeable to OT, sleeping but easily woken. Pt agreeable to additional session not originally on schedule. Some discomfort in abdominal area, which has been ongoing and rest breaks provided PRN. Pt noted to be motivated to participate but wanting additional time to "get ready." Returning a few minutes later, agreeable to OT session and focus on bed mobility, rolling L/R with Min A using bed rail, LAQ bed level 2x10 each, hip flexion 2x10 with static stretch, 2x5 bridges with Min facilitation throughout and rest breaks between sets. Pt motivated and wanting to sit up but stating she cannot at this time d/t fatigue. HOB raised and pt drinking water with Set up. Focus remaining session on pt ed for energy conservation, hand out from OT toolkit provided and pt eager to learn. Positioned on side lying with R shoulder in scaption, pillow between knees for pressure prevention. Alarm on call bell in reach.   Therapy Documentation Precautions:  Precautions Precautions: Fall Precaution Comments: abdominal binder to protect PEG site, oral thrush/mucositis and mouth ulceration, 2L 02 Restrictions Weight Bearing Restrictions: No    Therapy/Group: Individual Therapy  Viona Gilmore 11/09/2020, 3:43 PM

## 2020-11-09 NOTE — Progress Notes (Signed)
Patient ID: [name] [name], female   DOB: 15-Jan-1931, 87 y.o.   MRN: [id number]  Three POA's coming up to talk with pt. Have contacted front desk to allow in. Want to discuss best option for her after rehab. Team is recommending SNF for the care she will require.

## 2020-11-09 NOTE — Progress Notes (Signed)
Physical Therapy Session Note  Patient Details  Name: [name] [name] MRN: [id number] Date of Birth: 12/17/1930  Today's Date: 11/09/2020 PT Individual Time: 0800-0830 PT Individual Time Calculation (min): 30 min   Short Term Goals: Week 1:  PT Short Term Goal 1 (Week 1): pt to demonstrated supine<>sit min A PT Short Term Goal 2 (Week 1): pt to demosntrated functional transfer min A with LRAD PT Short Term Goal 3 (Week 1): pt to demonstrate gait 25' min A with LRAD Week 2:    Week 3:     Skilled Therapeutic Interventions/Progress Updates:    Pain:  Pt reports severe pain.  Treatment to tolerance.  Rest breaks and repositioning as needed.  Pt initially sidelying w/nursing assisting w/brief change/dressing change.Pt rolls w/encouragement, cga, painful transition to L/R for brief cange and repositioning of abdominal binder. Side to sit w/cga, c/o feeling dizzy, declines LE therex in sitting, states "maybe later".  Tolerated sitting x 6 min before returning sidelying to supine to sidelying on R w/cga.  Pt positioned w/pillows for comfort. Pt washed face in sidelying w/set up assist only.  Pt performs oral care w/set up, min assist to manage yaunker, set up.  Pt declines further intervention due to pain. Pt left sidelyingw/rails up x 3, nursing at bedside provideing meds via peg, and needs in reach.    Therapy Documentation Precautions:  Precautions Precautions: Fall Precaution Comments: abdominal binder to protect PEG site, oral thrush/mucositis and mouth ulceration, 2L 02 Restrictions Weight Bearing Restrictions: No   Therapy/Group: Individual Therapy  Callie Fielding, [place] 11/09/2020, 12:58 PM

## 2020-11-09 NOTE — Progress Notes (Signed)
Occupational Therapy Session Note  Patient Details  Name: [name] [name] MRN: [id number] Date of Birth: 1930/08/06  Today's Date: 11/09/2020 OT Individual Time: 1130-1155 OT Individual Time Calculation (min): 25 min    Short Term Goals: Week 1:  OT Short Term Goal 1 (Week 1): Pt will complete LB dressing with Mod A OT Short Term Goal 2 (Week 1): Pt will complete 1 grooming task while standing at the sink to increase standing endurance OT Short Term Goal 3 (Week 1): Pt will complete sit<stand during toileting with Min A and LRAD   Skilled Therapeutic Interventions/Progress Updates:    Pt greeted at time of session semireclined in bed resting agreeable to OT session, fatigued and in pain in R arm and B knees. Determined likely from stiffness as discomfort improved with movement. Discussed pressure relief and changing position in bed initially, pt rolling on to L side Min A and supine > sit Min/CGA. Sitting EOB for NT to check blood sugar and change shirt with Min A, returning to side lying on L side d/t fatigue for a break, educated on energy conservation as well briefly. Donned pants bed level d/t fatigue, max A but able to partially bridge in bed. Pt in bed alarm on call bell in reach. Rn aware of doscomort, checking on pain meds.   Therapy Documentation Precautions:  Precautions Precautions: Fall Precaution Comments: abdominal binder to protect PEG site, oral thrush/mucositis and mouth ulceration, 2L 02 Restrictions Weight Bearing Restrictions: No     Therapy/Group: Individual Therapy  Viona Gilmore 11/09/2020, 12:32 PM

## 2020-11-10 ENCOUNTER — Other Ambulatory Visit: Payer: Medicare Other | Admitting: Nurse Practitioner

## 2020-11-10 LAB — GLUCOSE, CAPILLARY
Glucose-Capillary: 102 mg/dL — ABNORMAL HIGH (ref 70–99)
Glucose-Capillary: 104 mg/dL — ABNORMAL HIGH (ref 70–99)
Glucose-Capillary: 110 mg/dL — ABNORMAL HIGH (ref 70–99)
Glucose-Capillary: 111 mg/dL — ABNORMAL HIGH (ref 70–99)
Glucose-Capillary: 117 mg/dL — ABNORMAL HIGH (ref 70–99)
Glucose-Capillary: 119 mg/dL — ABNORMAL HIGH (ref 70–99)
Glucose-Capillary: 126 mg/dL — ABNORMAL HIGH (ref 70–99)
Glucose-Capillary: 146 mg/dL — ABNORMAL HIGH (ref 70–99)

## 2020-11-10 MED ORDER — MIRTAZAPINE 30 MG PO TBDP
30.0000 mg | ORAL_TABLET | Freq: Every day | ORAL | Status: DC
  Administered 2020-11-10 – 2020-11-22 (×13): 30 mg via ORAL
  Filled 2020-11-10 (×13): qty 1

## 2020-11-10 MED ORDER — DRONABINOL 2.5 MG PO CAPS
2.5000 mg | ORAL_CAPSULE | Freq: Every day | ORAL | Status: DC
  Administered 2020-11-10 – 2020-11-13 (×4): 2.5 mg via ORAL
  Filled 2020-11-10 (×4): qty 1

## 2020-11-10 NOTE — Progress Notes (Signed)
Patient ID: [name] [name], female   DOB: 11/09/1930, 87 y.o.   MRN: [id number]  Met with pt to inform of team conference goals of min assist and team recommendation for SNF from here. Also spoke with [name] regarding recommendation of team and MD of SNF from here. Pt has the list and will talk with POA team regarding top three choices and get back with this worker, will do FL2 and start to pursue SNF. Pt is on a diet now and ate well her lunch. Work on discharge plan.  

## 2020-11-10 NOTE — Progress Notes (Signed)
PROGRESS NOTE   Subjective/Complaints:  Pt reports gum pain not nearly as bad this AM- thinks Oxy is helpful and wants to continue.  Slept "off and on"- still sleepy.   Hasn't eaten anything from breakfast so far! Cannot do 3 hours/day per pt and Therapy- agrees that needs to "go to subacute rehab".   ROS:  Pt denies SOB, abd pain, CP, N/V/C/D, and vision changes   Objective:   No results found. No results for input(s): WBC, HGB, HCT, PLT in the last 72 hours. No results for input(s): NA, K, CL, CO2, GLUCOSE, BUN, CREATININE, CALCIUM in the last 72 hours.  Intake/Output Summary (Last 24 hours) at 11/10/2020 0830 Last data filed at 11/10/2020 0541 Gross per 24 hour  Intake 1489 ml  Output --  Net 1489 ml     Pressure Injury 11/04/20 Sacrum Mid Stage 3 -  Full thickness tissue loss. Subcutaneous fat may be visible but bone, tendon or muscle are NOT exposed. Circular pencil eraser like hole on sacrum; initially documented 09/05/20 as stage 2 pressure area (Active)  11/04/20 1330  Location: Sacrum  Location Orientation: Mid  Staging: Stage 3 -  Full thickness tissue loss. Subcutaneous fat may be visible but bone, tendon or muscle are NOT exposed.  Wound Description (Comments): Circular pencil eraser like hole on sacrum; initially documented 09/05/20 as stage 2 pressure area  Present on Admission: Yes     Pressure Injury Buttocks Left Stage 2 -  Partial thickness loss of dermis presenting as a shallow open injury with a red, pink wound bed without slough. oval shaped denuded area (Active)     Location: Buttocks  Location Orientation: Left  Staging: Stage 2 -  Partial thickness loss of dermis presenting as a shallow open injury with a red, pink wound bed without slough.  Wound Description (Comments): oval shaped denuded area  Present on Admission: Yes     Pressure Injury 11/04/20 Lumbar Lateral;Right Stage 1 -  Intact skin  with non-blanchable redness of a localized area usually over a bony prominence. purewick coiled tubing shaped reddended non blancheable area on flank (Active)  11/04/20 1330  Location: Lumbar  Location Orientation: Lateral;Right  Staging: Stage 1 -  Intact skin with non-blanchable redness of a localized area usually over a bony prominence.  Wound Description (Comments): purewick coiled tubing shaped reddended non blancheable area on flank  Present on Admission: Yes    Physical Exam: Vital Signs Blood pressure 92/65, pulse 88, temperature 98.4 F (36.9 C), temperature source Oral, resp. rate 16, height 5\' 4"  (1.626 m), weight 38.9 kg, SpO2 99 %.    General: awake, alert, appropriate, laying supine in bed- extremely cachetic, but weight up slightly;  NAD HENT: conjugate gaze; oropharynx dry- only can see gums CV: regular rate; no JVD Pulmonary: CTA B/L; no W/R/R- good air movement GI: soft, NT, ND, (+)BS- abd wound C/D/I Psychiatric: more interactive, c/o less pain; doesn't think can do 3 hours/day- maybe 1x/day Neurological: alert  Musculoskeletal:     Cervical back: Normal range of motion and neck supple.     Comments: No edema or tenderness in extremities  Skin:    Comments: Stage  II 3 tiny spots -2 on coccyx, 1 on inner L buttock- no change today Ulcer on left upper posterior pelvis Abdominal open wound C/D/I- not assessed today, but is C/D/I today  Neurological:     Mental Status: She is alert.     Comments: Alert and oriented HOH Motor: B/l UE: 4+/5 proximal to distal RLE: 4/5 proximal to distal LLE: 4/5 HF, KE, 3-/5 ADF     Assessment/Plan: 1. Functional deficits which require 3+ hours per day of interdisciplinary therapy in a comprehensive inpatient rehab setting.  Physiatrist is providing close team supervision and 24 hour management of active medical problems listed below.  Physiatrist and rehab team continue to assess barriers to discharge/monitor patient  progress toward functional and medical goals  Care Tool:  Bathing    Body parts bathed by patient: Right arm,Left arm,Chest,Abdomen,Front perineal area,Buttocks,Right upper leg,Left upper leg,Face   Body parts bathed by helper: Right lower leg,Left lower leg     Bathing assist Assist Level: Minimal Assistance - Patient > 75%     Upper Body Dressing/Undressing Upper body dressing   What is the patient wearing?: Pull over shirt    Upper body assist Assist Level: Minimal Assistance - Patient > 75%    Lower Body Dressing/Undressing Lower body dressing      What is the patient wearing?: Incontinence brief,Pants     Lower body assist Assist for lower body dressing: Maximal Assistance - Patient 25 - 49%     Toileting Toileting    Toileting assist Assist for toileting: Moderate Assistance - Patient 50 - 74% Assistive Device Comment: incontinent brief   Transfers Chair/bed transfer  Transfers assist  Chair/bed transfer activity did not occur: Safety/medical concerns        Locomotion Ambulation   Ambulation assist   Ambulation activity did not occur: Safety/medical concerns          Walk 10 feet activity   Assist  Walk 10 feet activity did not occur: Safety/medical concerns        Walk 50 feet activity   Assist Walk 50 feet with 2 turns activity did not occur: Safety/medical concerns         Walk 150 feet activity   Assist Walk 150 feet activity did not occur: Safety/medical concerns         Walk 10 feet on uneven surface  activity   Assist Walk 10 feet on uneven surfaces activity did not occur: Safety/medical concerns         Wheelchair     Assist Will patient use wheelchair at discharge?: Yes (pt will likely require WC at DC however pt unable to tolerate WC transfer or assessment on this date) Type of Wheelchair: Manual Wheelchair activity did not occur: Safety/medical concerns         Wheelchair 50 feet with 2 turns  activity    Assist    Wheelchair 50 feet with 2 turns activity did not occur: Safety/medical concerns       Wheelchair 150 feet activity     Assist  Wheelchair 150 feet activity did not occur: Safety/medical concerns       Blood pressure 92/65, pulse 88, temperature 98.4 F (36.9 C), temperature source Oral, resp. rate 16, height 5\' 4"  (1.626 m), weight 38.9 kg, SpO2 99 %.  Medical Problem List and Plan: 1.  Debility secondary to abdominal wall cellulitis/sepsis complicated by chronic hypoxic respiratory failure/RA             -patient  may not shower             -ELOS/Goals: 14-17 days/Min A             will change to 1x/day of PT and OT- and look for SNF bed- since so low function per pt and PT/OT  Con't  PT and OT- needs SNF placement- only doing 1x/day.  2.  Impaired mobility -DVT/anticoagulation: Continue Lovenox             -antiplatelet therapy: N/A 3. Pain Management: Tylenol as needed  4/29- tried tramadol - didn't work- and try Norco 5/325 mg q4 hours prn  4/30: having 10/10 pain with dressing change, continue Norco and give 30 minutes prior to dressing change, Caryl Pina will pass onto oncoming nurse  5/1: AST elevated and pain prohibiting participation with therapy. Switch to oxycodone for better pain control and less tylenol.   5/2- AST is 42- very slightly elevated, likely from frequent tylenol usage- will ask nursing to use Oxy and not tylenol for pain.   5/3- changed Tylenol order only for fever- con't oxy which pt feels is real helpful for gum pain.  4. Mood: Remeron 15 mg nightly  5/3- will change to 30 mg QHS- is better dose for appetite             -antipsychotic agents: N/A 5. Neuropsych: This patient is capable of making decisions on her own behalf. 6. Skin/Wound Care: Routine skin checks.  Foam dressing to umbilicus and change every 3 days as needed soiling.  Apply saline moistened 2 x 2 to abdomen wound daily cover with dry 2 x 2 and tape.  Apply dry  split thickness gauze underneath G-tube daily.  4/30: reconsulted wound care as PEG site with some leaking TF  5/2- said it's due to fistula- won't follow, but remain available. Also has 3- Stage II small spots on coccyx x2 and 1 on L inner buttock- con't wound care/foam 7. Fluids/Electrolytes/Nutrition: Routine in and outs.             CMP ordered 8.  Rheumatoid arthritis.  Continue prednisone. 9.  Chronic hypoxic respiratory failure.  Oxygen therapy as directed continue chronic prednisone.             Monitor O2 sats with increased exertion 10.  Dysphagia.  Dysphagia #1 thin liquids.  G-tube replaced 10/29/2020 per interventional radiology.  Dietary follow-up             Advance diet as tolerated  4/29- PEG- fell out- got back in by radiology-but has high risk of coming out- came out with balloon intact- con't to monitor  11.  Thrush/mouth pain/odynophagia.  Follow Dr. Benson Norway  4/29- try Norco/Oxycodone- 5/325 mg Q4 hours prn- thrush appears to be improved. If doesn't improve, will change to Oxycodone- has no bad reactions to pain meds in past.   5/2- con't oxy prn- don't use tylenol  5/3- oxy making a big difference- con't oxy- changed tylenol to be only for fever 12. Severe malnutrition  4/29- con't PEG; TFS nightly- BMI 13.6- con't to push PO- but limited by gum pain.   5/2- BMI up to 13.93-   5/3- BMI up to 14.72 today? Not sure if true or not, appears the same- con't regimen 13. Hypotension-   4/29- BP running 80s/40s-will try Low dose Midodrine- 2.5 mg TID with meals- for lower BP.   5/3- BP 90s/50s- better- no dizziness- con't regimen 14. Loose stools: likely 2/2 TF, continue  to monitor 15. Iron deficiency: started iron supplement    LOS: 6 days A FACE TO FACE EVALUATION WAS PERFORMED  [name] [name] 11/10/2020, 8:30 AM

## 2020-11-10 NOTE — Progress Notes (Signed)
Occupational Therapy Session Note  Patient Details  Name: [name] [name] MRN: [id number] Date of Birth: July 25, 1930  Today's Date: 11/10/2020 OT Individual Time: 4098-1191 OT Individual Time Calculation (min): 42 min    Short Term Goals: Week 1:  OT Short Term Goal 1 (Week 1): Pt will complete LB dressing with Mod A OT Short Term Goal 2 (Week 1): Pt will complete 1 grooming task while standing at the sink to increase standing endurance OT Short Term Goal 3 (Week 1): Pt will complete sit<stand during toileting with Min A and LRAD   Skilled Therapeutic Interventions/Progress Updates:    Pt greeted at time of session supine in bed resting but agreeable to OT session saying she was feeling a little better today and less abdominal discomfort. Initial part of session spent discussing DC planning regarding SNF placement and pt aware, planning on talking more with SW and recommended doing this. Pt agreeable and wants placement. Agreeable to trial sitting EOB, supine > sit Min A and sat EOB approx 5-6 minutes with Supervision for sitting balance with poor posture and rounded shoulders to brush teeth (with soft sponge brush and water) for comfort and per pt request. Washing face set up and Min A for brushing hair thoroughly. Pt requesting to remove O2, performed these tasks mentioned with sats remaining 95% no supplemental (states she wears at home PRN). Placed O2 via [place] back on pt, trialed sit > stand and performed with Mod A HHA and R hand on bed rail, able to stand approx 20 seconds with some lightheadedness, returned to sitting and symptoms improved and no more complaint. Sit > supine Supervision, Scoot up in bed Mod A and alarm on call bell in reach. Positioned in side lying with pillows for pressure relief.   Therapy Documentation Precautions:  Precautions Precautions: Fall Precaution Comments: abdominal binder to protect PEG site, oral thrush/mucositis and mouth ulceration, 2L  02 Restrictions Weight Bearing Restrictions: No     Therapy/Group: Individual Therapy  Viona Gilmore 11/10/2020, 7:20 AM

## 2020-11-10 NOTE — Patient Care Conference (Signed)
Inpatient RehabilitationTeam Conference and Plan of Care Update Date: 11/10/2020   Time: 11:55 AM    Patient Name: [name] [name]      Medical Record Number: [id number]  Date of Birth: 1930/08/30 Sex: Female         Room/Bed: 4W19C/4W19C-01 Payor Info: Payor: MEDICARE / Plan: MEDICARE PART A AND B / Product Type: *No Product type* /    Admit Date/Time:  11/04/2020 12:46 PM  Primary Diagnosis:  [facility] Problems: Principal Problem:   Debility Active Problems:   Protein-calorie malnutrition, severe   Pressure injury of skin    Expected Discharge Date: Expected Discharge Date:  (SNF)  Team Members Present: Physician leading conference: Dr. Courtney Heys Care Coodinator Present: Dorthula Nettles, RN, BSN, CRRN;Becky Dupree, LCSW Nurse Present: Dorthula Nettles, RN PT Present: Stacy Gardner, PT OT Present: Lillia Corporal, OT SLP Present: Charolett Bumpers, SLP PPS Coordinator present : Gunnar Fusi, SLP     Current Status/Progress Goal Weekly Team Focus  Bowel/Bladder   pt is incontinent of B/B LBM 05/02  pt will regain function of B/B  timed toileting q2h   Swallow/Nutrition/ Hydration   Regular and thin liquids, selecting softer foods  Mod I  tolerance and education of diet   ADL's   Max LB dress/bathe, Min/Mod stand pivot transfers, rolling bed level Min A, PEG/abdominal discomfort and fatigue severely limiting  LB dress Min, toileting and transfers CGA  OOB/EOB activity, sitting balance/core strength, standing balance/tolerance, pressure relief ed, energy conservation, DC planning, ADL retraining   Mobility   min A-mod A transfers, min A bed mob, very poor activity tolerance, intermittent pain  min A-mod A  inc tolerance to activity, bed mob, transfers, initiate gait, WC mob   Communication             Safety/Cognition/ Behavioral Observations  assessment was impacted by fatigue, still needs cognitive evalution  Supervision A  problem solving and anticipatory awareness    Pain   pt c/o abdominal pain controlled with current regimen  pain <=4/10  assess pain qshift and prn notify MD if pain unrelieved   Skin   abdominal wound with W-D dressing twice daily g-tube  continued healing of wound without complication, pt will be free of breakdown or infection  assess skin qshift and dressing changes as ordered     Discharge Planning:  Pt's team of POA's discussing with her plan. Probably back to a SNF due to amount of care pt will require and wound care management. Pt wants to be aggressive in her care   Team Discussion: LFT's elevated due to Tylenol. She has 3 small stage 2's. Midodrine started. Continent/incontinent B/B, has Oxy IR for mouth pain and it works best. Patient wants aggressive care. Patient on target to meet rehab goals: Has been participating. Max assist for lower body, min assist transfers. Declined to get out of bed, severe pain to the right forearm. Has only sat on side of bed once. SLP swallowing eval was recommended regular diet, thin liquids and to choose soft items.  *See Care Plan and progress notes for long and short-term goals.   Revisions to Treatment Plan:  Not at this time.  Teaching Needs: Family education, medication management, pain management, skin/wound care, dressing changes, transfer training, gait training, balance training, endurance training, safety awareness.  Current Barriers to Discharge: Decreased caregiver support, Medical stability, Home enviroment access/layout, Incontinence, Wound care, Lack of/limited family support, Weight, Medication compliance, Behavior and Nutritional means  Possible Resolutions  to Barriers: Continue current medications, offer nutritional support, provide emotional support.     Medical Summary Current Status: severe malnutrition- BMI ~ 13-14; on TFs at night; gum pain- on oxy prn; usually continent of bladder- inconitnent of bowel; has 3 Stage II's on buttocks/coccyx. R forearm painful- new;   Barriers to Discharge: Decreased family/caregiver support;Home enviroment access/layout;Wound care;Weight;Medical stability;Medication compliance;Other (comments)  Barriers to Discharge Comments: still wants aggressive care; full code; doesn't want palliative care. curls up in ball- doesn't help pressure issues;  pain is her biggest limiters as well as SEVERE malnutrition. Possible Resolutions to [employer] Focus: PEG- has fallen out x2- in right now; declining many therapy sessions; regular/thin- soft items- d/c- needs SNF when possible.   Continued Need for Acute Rehabilitation Level of Care: The patient requires daily medical management by a physician with specialized training in physical medicine and rehabilitation for the following reasons: Direction of a multidisciplinary physical rehabilitation program to maximize functional independence : Yes Medical management of patient stability for increased activity during participation in an intensive rehabilitation regime.: Yes Analysis of laboratory values and/or radiology reports with any subsequent need for medication adjustment and/or medical intervention. : Yes   I attest that I was present, lead the team conference, and concur with the assessment and plan of the team.   Cristi Loron 11/10/2020, 6:25 PM

## 2020-11-10 NOTE — Progress Notes (Signed)
[facility] 4W[facility] Liaison note:  This is a pending outpatient-based Palliative Care patient. Will continue to follow for disposition.  Please call with any outpatient palliative questions or concerns.  Thank you, Dee Curry, LPN [facility] Liaison [phone] 

## 2020-11-10 NOTE — Progress Notes (Signed)
Physical Therapy Session Note  Patient Details  Name: [name] [name] MRN: [id number] Date of Birth: 12-01-1930  Today's Date: 11/10/2020 PT Individual Time: 1000-1010 PT Individual Time Calculation (min): 10 min   Short Term Goals: Week 1:  PT Short Term Goal 1 (Week 1): pt to demonstrated supine<>sit min A PT Short Term Goal 2 (Week 1): pt to demosntrated functional transfer min A with LRAD PT Short Term Goal 3 (Week 1): pt to demonstrate gait 25' min A with LRAD  Skilled Therapeutic Interventions/Progress Updates:    pt received in bed and initially reported she did not feel she could participate in therapy this morning. Pt reported dull, deep ache in R forearm with heating pad but did not rank. Pt did agree to repositioning in bed for pressure relief, mod A to complete. Pt left in bed, All needs in reach and in good condition. Call light in hand.  Nursing updated on pain.   Therapy Documentation Precautions:  Precautions Precautions: Fall Precaution Comments: abdominal binder to protect PEG site, oral thrush/mucositis and mouth ulceration, 2L 02 Restrictions Weight Bearing Restrictions: No General: PT Amount of Missed Time (min): 35 Minutes PT Missed Treatment Reason: Patient fatigue;Patient unwilling to participate;Pain Vital Signs:   Pain:   Mobility:   Locomotion :    Trunk/Postural Assessment :    Balance:   Exercises:   Other Treatments:      Therapy/Group: Individual Therapy  Junie Panning 11/10/2020, 12:01 PM

## 2020-11-10 NOTE — NC FL2 (Signed)
[place] LEVEL OF CARE SCREENING TOOL     IDENTIFICATION  Patient Name: [name] [name] Birthdate: 1931-06-01 Sex: female Admission Date (Current Location): 11/04/2020  [facility] and [place] Number:  Herbalist and Address:  [facility] [place], [place] [address], [place], [place] [address]      Provider Number: [id number]  Attending Physician Name and Address:  Courtney Heys, MD  Relative Name and Phone Number:  Marcelo Baldy sister in-law [phone]    Current Level of Care: Other (Comment) (rehab) Recommended Level of Care: Congress Prior Approval Number:    Date Approved/Denied:   PASRR Number: [id number]  Discharge Plan: SNF    Current Diagnoses: Patient Active Problem List   Diagnosis Date Noted  . Debility 11/04/2020  . Dysphagia   . Oral thrush   . Encounter for dental examination   . Facial swelling   . Atrophy of edentulous alveolar ridge   . Abdominal wall cellulitis 10/24/2020  . Severe sepsis ([place]) 10/23/2020  . Abdominal pain 10/20/2020  . Diarrhea 10/02/2020  . Pressure injury of skin 09/06/2020  . Acute on chronic respiratory failure with hypoxemia ([place]) 08/23/2020  . Protein-calorie malnutrition, severe 08/20/2020  . Fall 01/25/2020  . Other fatigue 01/03/2020  . Left shoulder pain 01/03/2020  . Urinary frequency 01/03/2020  . Acute otalgia, left 09/06/2019  . Unintentional weight loss 07/18/2019  . Personal history of PE (pulmonary embolism) 04/23/2019  . Headache 02/11/2019  . Iron deficiency anemia 12/21/2018  . Cold sore 10/23/2018  . Blood in stool 09/14/2018  . 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 Therapeutic drug monitoring 07/17/2018  . Chronic diastolic CHF (congestive heart failure) ([place]) 12/19/2017  . Rash 08/11/2017  . Chronic respiratory failure with hypoxia ([place]) 03/30/2017  . Angular cheilitis 03/08/2017  . Leg pain 10/25/2016  . Back pain 08/05/2016  . RA  (rheumatoid arthritis) ([place]) 03/31/2015  . Allergic rhinitis   . Varicose vein 09/11/2014  . ILD (interstitial lung disease) ([place]) 06/25/2012  . Chest pain 02/27/2012  . CAD (coronary artery disease) 02/27/2012  . Pruritus 08/18/2009  . Postinflammatory pulmonary fibrosis / RA ILD  06/30/2008  . Constipation 01/03/2008  . Dyslipidemia 06/16/2007    Orientation RESPIRATION BLADDER Height & Weight     Self,Time,Situation,Place  O2 (2 liters continuous) Incontinent,Continent Weight: 85 lb 12.1 oz (38.9 kg) Height:  5\' 4"  (162.6 cm)  BEHAVIORAL SYMPTOMS/MOOD NEUROLOGICAL BOWEL NUTRITION STATUS      Continent,Incontinent Feeding tube,Diet (abdominal wound wet to dry BID-soft diet-thin liquids)  AMBULATORY STATUS COMMUNICATION OF NEEDS Skin   Extensive Assist Verbally PU Stage and Appropriate Care   PU Stage 2 Dressing: BID (sacral)                   Personal Care Assistance Level of Assistance  Bathing,Dressing Bathing Assistance: Limited assistance Feeding assistance: Independent Dressing Assistance: Limited assistance     Functional Limitations Info  Speech,Sight,Hearing Sight Info: Adequate Hearing Info: Adequate Speech Info: Adequate    SPECIAL CARE FACTORS FREQUENCY  PT (By licensed PT),OT (By licensed OT),Speech therapy,Bowel and bladder program     PT Frequency: 5 x week OT Frequency: 5 x week Bowel and Bladder Program Frequency: Timed tolieting every 2-3 hours   Speech Therapy Frequency: 5x week      Contractures Contractures Info: Not present    Additional Factors Info  Code Status,Allergies Code Status Info: Full Code Allergies Info: Crestor, Lactose intolerance, Penicillins and Imuran  Current Medications (11/10/2020):  This is the current hospital active medication list Current Facility-Administered Medications  Medication Dose Route Frequency Provider Last Rate Last Admin  . acetaminophen (TYLENOL) tablet 650 mg  650 mg Oral Q6H PRN  Lovorn, Megan, MD   650 mg at 11/10/20 1031   Or  . acetaminophen (TYLENOL) suppository 650 mg  650 mg Rectal Q6H PRN Lovorn, Megan, MD      . benzocaine (ORAJEL) 10 % mucosal gel   Mouth/Throat PRN Courtney Heys, MD   Given at 11/08/20 1020  . chlorhexidine (PERIDEX) 0.12 % solution 15 mL  15 mL Mouth/Throat QID AngiulliLavon Paganini, PA-C   15 mL at 11/10/20 1248  . dronabinol (MARINOL) capsule 2.5 mg  2.5 mg Oral QHS Lovorn, Megan, MD      . enoxaparin (LOVENOX) 100 mg/mL injection 20 mg  20 mg Subcutaneous Q24H Pham, Minh Q, RPH-CPP   20 mg at 11/10/20 0844  . famotidine (PEPCID) 40 MG/5ML suspension 20 mg  20 mg Per Tube Daily AngiulliLavon Paganini, PA-C   20 mg at 11/10/20 0848  . feeding supplement (BOOST / RESOURCE BREEZE) liquid 1 Container  1 Container Oral BID WC Lovorn, Jinny Blossom, MD   1 Container at 11/10/20 1248  . feeding supplement (ENSURE ENLIVE / ENSURE PLUS) liquid 237 mL  237 mL Oral Q24H Lovorn, Megan, MD   237 mL at 11/09/20 1854  . feeding supplement (OSMOLITE 1.5 CAL) liquid 840 mL  840 mL Per Tube Q24H Lovorn, Megan, MD 70 mL/hr at 11/09/20 1855 840 mL at 11/09/20 1855  . feeding supplement (PROSource TF) liquid 45 mL  45 mL Per Tube Daily Lovorn, Megan, MD   45 mL at 11/10/20 0848  . fluticasone (FLONASE) 50 MCG/ACT nasal spray 2 spray  2 spray Each Nare Daily PRN Cathlyn Parsons, PA-C   2 spray at 11/08/20 1021  . insulin aspart (novoLOG) injection 0-6 Units  0-6 Units Subcutaneous Q4H Cathlyn Parsons, PA-C   1 Units at 11/05/20 2048  . lipase/protease/amylase (CREON) capsule 36,000 Units  36,000 Units Oral TID AC Cathlyn Parsons, PA-C   36,000 Units at 11/10/20 1249  . magic mouthwash w/lidocaine  5 mL Oral TID Cathlyn Parsons, PA-C   5 mL at 11/10/20 0845  . midodrine (PROAMATINE) tablet 2.5 mg  2.5 mg Oral TID WC Lovorn, Megan, MD   2.5 mg at 11/10/20 1249  . mirtazapine (REMERON SOL-TAB) disintegrating tablet 30 mg  30 mg Oral QHS Lovorn, Megan, MD      .  multivitamins with iron tablet 1 tablet  1 tablet Oral Daily Raulkar, Clide Deutscher, MD   1 tablet at 11/10/20 0846  . mycophenolate (CELLCEPT) oral suspension 50 mg/mL  1,000 mg Per Tube BID Pham, Minh Q, RPH-CPP   1,000 mg at 11/10/20 0845  . Nintedanib (Ofev) CAPS 100 mg  100 mg Oral BID Cathlyn Parsons, PA-C   100 mg at 11/10/20 0849  . ondansetron (ZOFRAN) tablet 4 mg  4 mg Oral Q6H PRN Cathlyn Parsons, PA-C   4 mg at 11/09/20 9381   Or  . ondansetron (ZOFRAN) injection 4 mg  4 mg Intravenous Q6H PRN Angiulli, Lavon Paganini, PA-C      . oxyCODONE (Oxy IR/ROXICODONE) immediate release tablet 5 mg  5 mg Oral Q4H PRN Raulkar, Clide Deutscher, MD   5 mg at 11/09/20 1223  . predniSONE (DELTASONE) tablet 5 mg  5 mg Oral Q breakfast Lauraine Rinne  J, PA-C   5 mg at 11/10/20 0846  . Zinc Oxide (TRIPLE PASTE) 12.8 % ointment   Topical BID Raulkar, Clide Deutscher, MD   Given at 11/10/20 6222     Discharge Medications: Please see discharge summary for a list of discharge medications.  Relevant Imaging Results:  Relevant Lab Results:   Additional Information SSN: [id number] has been vaccinated and booster  Liisa Picone, Gardiner Rhyme, LCSW

## 2020-11-10 NOTE — Progress Notes (Incomplete)
Resting and easily aroused in no acute distress, pain remain at 9/10

## 2020-11-10 NOTE — Plan of Care (Signed)
  Problem: Consults Goal: RH GENERAL PATIENT EDUCATION Description: See Patient Education module for education specifics. Outcome: Progressing Goal: Skin Care Protocol Initiated - if Braden Score 18 or less Description: If consults are not indicated, leave blank or document N/A Outcome: Progressing Goal: Diabetes Guidelines if Diabetic/Glucose > 140 Description: If diabetic or lab glucose is > 140 mg/dl - Initiate Diabetes/Hyperglycemia Guidelines & Document Interventions  Outcome: Progressing   Problem: RH BOWEL ELIMINATION Goal: RH STG MANAGE BOWEL WITH ASSISTANCE Description: STG Manage Bowel with Min Assistance. Outcome: Progressing   Problem: RH BLADDER ELIMINATION Goal: RH STG MANAGE BLADDER WITH ASSISTANCE Description: STG Manage Bladder With Min Assistance Outcome: Progressing   Problem: RH SKIN INTEGRITY Goal: RH STG SKIN FREE OF INFECTION/BREAKDOWN Description: Skin to remain free from additional breakdown while on rehab with min assist. Outcome: Progressing Goal: RH STG MAINTAIN SKIN INTEGRITY WITH ASSISTANCE Description: STG Maintain Skin Integrity With [place]. Outcome: Progressing Goal: RH STG ABLE TO PERFORM INCISION/WOUND CARE W/ASSISTANCE Description: STG Able To Perform Incision/Wound Care With [employer]. Outcome: Progressing   Problem: RH SAFETY Goal: RH STG ADHERE TO SAFETY PRECAUTIONS W/ASSISTANCE/DEVICE Description: STG Adhere to Safety Precautions With Min Assistance/Device. Outcome: Progressing   Problem: RH PAIN MANAGEMENT Goal: RH STG PAIN MANAGED AT OR BELOW PT'S PAIN GOAL Description: < 4 on a 0-10 pain scale. Outcome: Progressing   Problem: RH KNOWLEDGE DEFICIT GENERAL Goal: RH STG INCREASE KNOWLEDGE OF SELF CARE AFTER HOSPITALIZATION Description: Patient will be able to demonstrate knowledge of medication management, diabetes management, wound care management, bowel/bladder management will educational materials and handouts  provided by staff, at discharge independently. Outcome: Progressing

## 2020-11-11 LAB — CREATININE, SERUM
Creatinine, Ser: 0.56 mg/dL (ref 0.44–1.00)
GFR, Estimated: >60 mL/min (ref >60)

## 2020-11-11 LAB — GLUCOSE, CAPILLARY
Glucose-Capillary: 112 mg/dL — ABNORMAL HIGH (ref 70–99)
Glucose-Capillary: 120 mg/dL — ABNORMAL HIGH (ref 70–99)
Glucose-Capillary: 122 mg/dL — ABNORMAL HIGH (ref 70–99)
Glucose-Capillary: 127 mg/dL — ABNORMAL HIGH (ref 70–99)
Glucose-Capillary: 129 mg/dL — ABNORMAL HIGH (ref 70–99)
Glucose-Capillary: 95 mg/dL (ref 70–99)

## 2020-11-11 MED ORDER — MIDODRINE HCL 5 MG PO TABS
5.0000 mg | ORAL_TABLET | Freq: Three times a day (TID) | ORAL | Status: DC
  Administered 2020-11-11 – 2020-11-23 (×36): 5 mg via ORAL
  Filled 2020-11-11 (×36): qty 1

## 2020-11-11 NOTE — Progress Notes (Signed)
PROGRESS NOTE   Subjective/Complaints:   Pt reports needs Oxy- was just given some- rating pain 10/10- went over with nursing to only use Tylenol for fever, not pain.   On 2L O2; weight up slightly to BMI of 15  ROS:  Pt denies SOB, abd pain, CP, N/V/C/D, and vision changes  Objective:   No results found. No results for input(s): WBC, HGB, HCT, PLT in the last 72 hours. Recent Labs    11/11/20 0506  CREATININE 0.56    Intake/Output Summary (Last 24 hours) at 11/11/2020 1030 Last data filed at 11/10/2020 1500 Gross per 24 hour  Intake 240 ml  Output --  Net 240 ml     Pressure Injury 11/04/20 Sacrum Mid Stage 3 -  Full thickness tissue loss. Subcutaneous fat may be visible but bone, tendon or muscle are NOT exposed. Circular pencil eraser like hole on sacrum; initially documented 09/05/20 as stage 2 pressure area (Active)  11/04/20 1330  Location: Sacrum  Location Orientation: Mid  Staging: Stage 3 -  Full thickness tissue loss. Subcutaneous fat may be visible but bone, tendon or muscle are NOT exposed.  Wound Description (Comments): Circular pencil eraser like hole on sacrum; initially documented 09/05/20 as stage 2 pressure area  Present on Admission: Yes     Pressure Injury Buttocks Left Stage 2 -  Partial thickness loss of dermis presenting as a shallow open injury with a red, pink wound bed without slough. oval shaped denuded area (Active)     Location: Buttocks  Location Orientation: Left  Staging: Stage 2 -  Partial thickness loss of dermis presenting as a shallow open injury with a red, pink wound bed without slough.  Wound Description (Comments): oval shaped denuded area  Present on Admission: Yes     Pressure Injury 11/04/20 Lumbar Lateral;Right Stage 1 -  Intact skin with non-blanchable redness of a localized area usually over a bony prominence. purewick coiled tubing shaped reddended non blancheable area on  flank (Active)  11/04/20 1330  Location: Lumbar  Location Orientation: Lateral;Right  Staging: Stage 1 -  Intact skin with non-blanchable redness of a localized area usually over a bony prominence.  Wound Description (Comments): purewick coiled tubing shaped reddended non blancheable area on flank  Present on Admission: Yes    Physical Exam: Vital Signs Blood pressure (!) 89/49, pulse 85, temperature 98.4 F (36.9 C), temperature source Oral, resp. rate 17, height 5\' 4"  (1.626 m), weight 39.9 kg, SpO2 93 %.     General: awake, sitting up slightly in bed; nursing in room; c/o gums 10/10; extremely cachetic, temples sunken in and eyes sockets, NAD HENT: conjugate gaze; oropharynx moist- just gum and 1 tooth seen-  CV: regular rate; no JVD Pulmonary: slightly coarse; but good air movement- O2 by  2L GI: soft, NT, ND, (+)BS- abd wound; PEG in place Psychiatric: appropriate- more awake, more interactive Neurological: alert  Musculoskeletal:     Cervical back: Normal range of motion and neck supple.     Comments: No edema or tenderness in extremities  Skin:    Comments: Stage II 3 tiny spots -2 on coccyx, 1 on inner L buttock- no  change today Ulcer on left upper posterior pelvis Abdominal open wound C/D/I- not assessed today, but is C/D/I today  Neurological:     Mental Status: She is alert.     Comments: Alert and oriented HOH Motor: B/l UE: 4+/5 proximal to distal RLE: 4/5 proximal to distal LLE: 4/5 HF, KE, 3-/5 ADF     Assessment/Plan: 1. Functional deficits which require 3+ hours per day of interdisciplinary therapy in a comprehensive inpatient rehab setting.  Physiatrist is providing close team supervision and 24 hour management of active medical problems listed below.  Physiatrist and rehab team continue to assess barriers to discharge/monitor patient progress toward functional and medical goals  Care Tool:  Bathing    Body parts bathed by patient: Right  arm,Left arm,Chest,Abdomen,Front perineal area,Buttocks,Right upper leg,Left upper leg,Face   Body parts bathed by helper: Right lower leg,Left lower leg     Bathing assist Assist Level: Minimal Assistance - Patient > 75%     Upper Body Dressing/Undressing Upper body dressing   What is the patient wearing?: Pull over shirt    Upper body assist Assist Level: Minimal Assistance - Patient > 75%    Lower Body Dressing/Undressing Lower body dressing      What is the patient wearing?: Incontinence brief,Pants     Lower body assist Assist for lower body dressing: Maximal Assistance - Patient 25 - 49%     Toileting Toileting    Toileting assist Assist for toileting: Moderate Assistance - Patient 50 - 74% Assistive Device Comment: incontinent brief   Transfers Chair/bed transfer  Transfers assist  Chair/bed transfer activity did not occur: Safety/medical concerns        Locomotion Ambulation   Ambulation assist   Ambulation activity did not occur: Safety/medical concerns          Walk 10 feet activity   Assist  Walk 10 feet activity did not occur: Safety/medical concerns        Walk 50 feet activity   Assist Walk 50 feet with 2 turns activity did not occur: Safety/medical concerns         Walk 150 feet activity   Assist Walk 150 feet activity did not occur: Safety/medical concerns         Walk 10 feet on uneven surface  activity   Assist Walk 10 feet on uneven surfaces activity did not occur: Safety/medical concerns         Wheelchair     Assist Will patient use wheelchair at discharge?: Yes (pt will likely require WC at DC however pt unable to tolerate WC transfer or assessment on this date) Type of Wheelchair: Manual Wheelchair activity did not occur: Safety/medical concerns         Wheelchair 50 feet with 2 turns activity    Assist    Wheelchair 50 feet with 2 turns activity did not occur: Safety/medical  concerns       Wheelchair 150 feet activity     Assist  Wheelchair 150 feet activity did not occur: Safety/medical concerns       Blood pressure (!) 89/49, pulse 85, temperature 98.4 F (36.9 C), temperature source Oral, resp. rate 17, height 5\' 4"  (1.626 m), weight 39.9 kg, SpO2 93 %.  Medical Problem List and Plan: 1.  Debility secondary to abdominal wall cellulitis/sepsis complicated by chronic hypoxic respiratory failure/RA             -patient may not shower             -  ELOS/Goals: 14-17 days/Min A             will change to 1x/day of PT and OT- and look for SNF bed- since so low function per pt and PT/OT  Con't  PT and OT- needs SNF placement- only doing 1x/day.   -con't PT and OT at slowed schedule- looking for SNF 2.  Impaired mobility -DVT/anticoagulation: Continue Lovenox             -antiplatelet therapy: N/A 3. Pain Management: Tylenol as needed  4/29- tried tramadol - didn't work- and try Norco 5/325 mg q4 hours prn  4/30: having 10/10 pain with dressing change, continue Norco and give 30 minutes prior to dressing change, Caryl Pina will pass onto oncoming nurse  5/1: AST elevated and pain prohibiting participation with therapy. Switch to oxycodone for better pain control and less tylenol.   5/2- AST is 42- very slightly elevated, likely from frequent tylenol usage- will ask nursing to use Oxy and not tylenol for pain.   5/3- changed Tylenol order only for fever- con't oxy which pt feels is real helpful for gum pain.   5/4- reinforced to nursing- con't regimen 4. Mood: Remeron 15 mg nightly  5/3- will change to 30 mg QHS- is better dose for appetite             -antipsychotic agents: N/A 5. Neuropsych: This patient is capable of making decisions on her own behalf. 6. Skin/Wound Care: Routine skin checks.  Foam dressing to umbilicus and change every 3 days as needed soiling.  Apply saline moistened 2 x 2 to abdomen wound daily cover with dry 2 x 2 and tape.  Apply dry  split thickness gauze underneath G-tube daily.  4/30: reconsulted wound care as PEG site with some leaking TF  5/2- said it's due to fistula- won't follow, but remain available. Also has 3- Stage II small spots on coccyx x2 and 1 on L inner buttock- con't wound care/foam  5/4- will put on air mattress since not moving and has 3 Stage II's-  7. Fluids/Electrolytes/Nutrition: Routine in and outs.             CMP ordered 8.  Rheumatoid arthritis.  Continue prednisone. 9.  Chronic hypoxic respiratory failure.  Oxygen therapy as directed continue chronic prednisone.             Monitor O2 sats with increased exertion 10.  Dysphagia.  Dysphagia #1 thin liquids.  G-tube replaced 10/29/2020 per interventional radiology.  Dietary follow-up             Advance diet as tolerated  4/29- PEG- fell out- got back in by radiology-but has high risk of coming out- came out with balloon intact- con't to monitor  11.  Thrush/mouth pain/odynophagia.  Follow Dr. Benson Norway  4/29- try Norco/Oxycodone- 5/325 mg Q4 hours prn- thrush appears to be improved. If doesn't improve, will change to Oxycodone- has no bad reactions to pain meds in past.   5/2- con't oxy prn- don't use tylenol  5/3- oxy making a big difference- con't oxy- changed tylenol to be only for fever 12. Severe malnutrition  4/29- con't PEG; TFS nightly- BMI 13.6- con't to push PO- but limited by gum pain.   5/2- BMI up to 13.93-   5/3- BMI up to 14.72 today? Not sure if true or not, appears the same- con't regimen  5/4- BMI up to 15 13. Hypotension-   4/29- BP running 80s/40s-will try Low dose Midodrine-  2.5 mg TID with meals- for lower BP.   5/3- BP 90s/50s- better- no dizziness- con't regimen  5/4 will increase to 5 mg TID with meals- since BP 80/s40s again 14. Loose stools: likely 2/2 TF, continue to monitor 15. Iron deficiency: started iron supplement    LOS: 7 days A FACE TO FACE EVALUATION WAS PERFORMED  Tarae Wooden 11/11/2020, 10:30 AM

## 2020-11-11 NOTE — Progress Notes (Signed)
Occupational Therapy Session Note  Patient Details  Name: [name] [name] MRN: [id number] Date of Birth: 1930/07/24  Today's Date: 11/11/2020 OT Individual Time: 1449-1530 OT Individual Time Calculation (min): 41 min    Short Term Goals: Week 1:  OT Short Term Goal 1 (Week 1): Pt will complete LB dressing with Mod A OT Short Term Goal 2 (Week 1): Pt will complete 1 grooming task while standing at the sink to increase standing endurance OT Short Term Goal 3 (Week 1): Pt will complete sit<stand during toileting with Min A and LRAD  Skilled Therapeutic Interventions/Progress Updates:    Pt in wheelchair resting to start.  She was agreeable to participate with OT.  Began with work on sit to stand from the wheelchair with use of the RW and for standing endurance.  She was able to complete sit to stand with min assist and mod demonstrational cueing for hand placement on the arms of the wheelchair.  Once standing, she exhibits a flexed posture through her trunk and head.  Standing endurance for up to approximately two mins with mod demonstrational cueing and min assist to maintain balance.  She was able to stand at the sink next in order to complete oral hygiene and rinse out her mouth.  Min assist for standing balance while completing task with min instructional cueing for sequencing.  She then sat in the wheelchair while brushing her hair with setup.  Finished session with transfers from wheelchair to bed X2 with mod assist using the RW for support and transition to supine with min assist.  She was left with visitor in the room and with the call button and phone in reach.  Nursing made aware that pt's PEG tube was irritating her.     Therapy Documentation Precautions:  Precautions Precautions: Fall Precaution Comments: abdominal binder to protect PEG site, oral thrush/mucositis and mouth ulceration, 2L 02 Restrictions Weight Bearing Restrictions: No  Pain: Pain Assessment Pain Scale: Faces Pain  Score: 0-No pain ADL: See Care Tool Section for some details of mobility and selfcare  Therapy/Group: Individual Therapy  Merlon Alcorta OTR/L 11/11/2020, 4:40 PM

## 2020-11-11 NOTE — Progress Notes (Signed)
Physical Therapy Session Note  Patient Details  Name: [name] [name] MRN: [id number] Date of Birth: 11-24-1930  Today's Date: 11/11/2020 PT Individual Time: 1118-1203 PT Individual Time Calculation (min): 45 min   Short Term Goals: Week 1:  PT Short Term Goal 1 (Week 1): pt to demonstrated supine<>sit min A PT Short Term Goal 2 (Week 1): pt to demosntrated functional transfer min A with LRAD PT Short Term Goal 3 (Week 1): pt to demonstrate gait 25' min A with LRAD  Skilled Therapeutic Interventions/Progress Updates:     Pt received supine in bed and agrees to therapy. No complaint of pain. Pt performs supine to sit slowly with minA. At EOB, PT threads pt's pants over each leg. Pt stands with modA HHA and PT provides modA to pull up pants. Pt verbalizes need to urinate. Stand pivot to WC with modA and transport to toilet in [place]. Pt performs stand pivot to elevated toilet seat with modA. Pt has heavy posterior lean and is able to correct slightly with multimodal cues, but requires at least modA to prevent backward fall while standing. PT educates on body mechanics and posture for improved balance. Pt is continent of urine and perform pericare indpendently. Stand pivot back to WC with modA. WC transport outside for time management. Pt performs sit to stand with RW and modA. Able to improve to requiring minA in standing with time and cueing. Pt performs x10 reps marching in place with RW and minA. WC transport back to room. Pt left seated in WC with alarm intact and all needs within reach.  Therapy Documentation Precautions:  Precautions Precautions: Fall Precaution Comments: abdominal binder to protect PEG site, oral thrush/mucositis and mouth ulceration, 2L 02 Restrictions Weight Bearing Restrictions: No    Therapy/Group: Individual Therapy  Breck Coons, PT, DPT 11/11/2020, 12:47 PM

## 2020-11-11 NOTE — Progress Notes (Signed)
Speech Language Pathology Daily Session Note  Patient Details  Name: [name] [name] MRN: [id number] Date of Birth: March 06, 1931  Today's Date: 11/11/2020 SLP Individual Time: 0830-0900 SLP Individual Time Calculation (min): 30 min  Short Term Goals: Week 1: SLP Short Term Goal 1 (Week 1): Patient will demonstrated toleration of current diet Dys 1 puree solids and thin liquids without overt s/s aspiration or penetration. SLP Short Term Goal 2 (Week 1): Patient will tolerate trials of upgraded solids with adequate mastication and oral phase of swallow with supervision A. SLP Short Term Goal 3 (Week 1): Patient will participate in cognitive-linguistic and speech evaluation to determine need for development of tx goals.  Skilled Therapeutic Interventions: Pt seen for skilled ST with focus on dysphagia goals. Pt reporting mouth pain 6/10 this AM, pointing to front of mouth where implant abrasions are. Pt observed with few bites of regular solids, extended mastication likely 2' pain and min oral stasis after swallow. Liquid wash encouraged to clear, effective. Pt offered yogurt, consuming few bites before becoming fatigued and requesting a rest. Min oral stasis noted with puree as well, cleared with cued liquid wash. SLP and pt reviewing standard swallow precautions including alternate solids and liquids, small bites and upright positioning for all PO intake. Pt verbalizing understanding. No overt s/s aspiration with any trials thin via straw, regular or puree this date. Spoke with RN who states pt has taken about an hour and a half to eat ~25% of breakfast. Pt left in bed with remaining breakfast tray present, encouraged to continue eating, bed alarm set. Cont ST POC.   Pain Pain Assessment Pain Scale: 0-10 Pain Score: 6  Pain Type: Acute pain Pain Location: Mouth Pain Orientation: Right  Therapy/Group: Individual Therapy  Dewaine Conger 11/11/2020, 8:46 AM

## 2020-11-11 NOTE — Progress Notes (Signed)
Nutrition Follow-up  DOCUMENTATION CODES:   Underweight,Severe malnutrition in context of chronic illness  INTERVENTION:   Continue nocturnal tube feeds via G-tube: - Osmolite 1.5 @ 70 ml/hr to run over 12 hours (1800 to 0600) - ProSource TF 45 ml daily per tube  Nocturnal tube feeding regimen provides 1300 kcal, 64 grams of protein, and 640 ml of H2O (meets 87% of kcal needs and 91% of protein needs).  - Boost Breeze po BID with breakfast and lunch meals, each supplement provides 250 kcal and 9 grams of protein  - Ensure Enlive po daily with dinner meal, each supplement provides 350 kcal and 20 grams of protein  - Continue MVI with minerals daily  NUTRITION DIAGNOSIS:   Severe Malnutrition related to chronic illness as evidenced by severe fat depletion,severe muscle depletion,percent weight loss (23.7% weight loss in less than 4 months).  Ongoing, being addressed via tube feeds and supplements  GOAL:   Patient will meet greater than or equal to 90% of their needs  Progressing  MONITOR:   PO intake,Supplement acceptance,Weight trends,TF tolerance,Skin  REASON FOR ASSESSMENT:   Malnutrition Screening Tool,Consult Enteral/tube feeding initiation and management  ASSESSMENT:   87 year old female with PMH of chronic hypoxic respiratory failure, interstitial pneumonia, pulmonary fibrosis on 2 L oxygen, rheumatoid arthritis, DVT/PE, SBO s/p ex-lap in February 2022 with G-tube placement 08/23/20 that has since been removed. Pt presented on 10/23/20 with increasing abdominal pain and some wound drainage from prior G-tube, AMS, right foot drop. Due to patient's poor PO intake and ongoing significant dysphagia, IR consulted and G-tube was placed 10/29/20. Admitted to CIR on 4/27.  4/29 - G-tube replaced  Palliative Care tea following. Per notes, pt is not tolerating therapy well due to abdominal pain and fatigue. Pt likely requires SNF placement.  Per SLP note, "spoke with RN  who states pt has taken about an hour and a half to eat ~25% of breakfast." RD spoke with nurse who reports pt did pretty well with breakfast and lunch today.  Spoke with pt and guest at bedside. Pt reports severe abdominal pain. LPN made aware. Pt reports tooth pain is much better. Pt wondering if a dentist is going to come see her.  Pt reports that she is doing well with the nighttime tube feeds. She states that she is consuming 1 Boost Breeze a day and maybe 1 Ensure Enlive a day but she is not sure. RD encouraged pt to continue to do the best that she can with PO intake at meals and with consumption of supplements.  Admit weight: 36.3 kg Current weight: 39.9 kg  Meal Completion: 10-75%  Medications reviewed and include: marinol, pepcid, Boost Breeze BID, Ensure Enlive daily, SSI q 4 hours, Creon 36,000 units TID, magic mouthwash, remeron, MVI with iron, prednisone  Labs reviewed. CBG's: 95-127 x 24 hours  Diet Order:   Diet Order            Diet regular Room service appropriate? Yes; Fluid consistency: Thin  Diet effective now                 EDUCATION NEEDS:   No education needs have been identified at this time  Skin:  Skin Assessment Skin Integrity Issues: Stage I: lumbar Stage II: L buttocks Stage III: sacrum Incisions: abdomen (old G-tube site)  Last BM:  11/10/20  Height:   Ht Readings from Last 1 Encounters:  11/04/20 5\' 4"  (1.626 m)    Weight:   Wt Readings  from Last 1 Encounters:  11/11/20 39.9 kg    Ideal Body Weight:  54.5 kg  BMI:  Body mass index is 15.1 kg/m.  Estimated Nutritional Needs:   Kcal:  1500-1700  Protein:  70-85 grams  Fluid:  1.5 L/day    Gustavus Bryant, MS, RD, LDN Inpatient Clinical Dietitian Please see [facility] for contact information.

## 2020-11-12 LAB — GLUCOSE, CAPILLARY
Glucose-Capillary: 123 mg/dL — ABNORMAL HIGH (ref 70–99)
Glucose-Capillary: 105 mg/dL — ABNORMAL HIGH (ref 70–99)
Glucose-Capillary: 123 mg/dL — ABNORMAL HIGH (ref 70–99)
Glucose-Capillary: 97 mg/dL (ref 70–99)
Glucose-Capillary: 117 mg/dL — ABNORMAL HIGH (ref 70–99)

## 2020-11-12 MED ORDER — MYCOPHENOLATE 200 MG/ML ORAL SUSPENSION
1000.0000 mg | Freq: Two times a day (BID) | ORAL | Status: DC
  Administered 2020-11-13 – 2020-11-14 (×3): 1000 mg
  Filled 2020-11-12 (×5): qty 20

## 2020-11-12 MED FILL — Medication: Qty: 1 | Status: AC

## 2020-11-12 NOTE — Progress Notes (Signed)
Speech Language Pathology Daily Session Note  Patient Details  Name: [name] [name] MRN: [id number] Date of Birth: Nov 18, 1930  Today's Date: 11/12/2020 SLP Individual Time: 8295-6213 SLP Individual Time Calculation (min): 25 min  Short Term Goals: Week 1: SLP Short Term Goal 1 (Week 1): Patient will demonstrated toleration of current diet Dys 1 puree solids and thin liquids without overt s/s aspiration or penetration. SLP Short Term Goal 2 (Week 1): Patient will tolerate trials of upgraded solids with adequate mastication and oral phase of swallow with supervision A. SLP Short Term Goal 3 (Week 1): Patient will participate in cognitive-linguistic and speech evaluation to determine need for development of tx goals.  Skilled Therapeutic Interventions:   Patient seen for skilled ST session focusing on dysphagia goals. RN who is in room reported that patient's appetite and oral intake of PO's has improved with scheduled pain medication. Patient reports that she is able to tolerate soft solids and has been ordering foods she knows she can manage. Patient continues with mouth pain and said she thought something was going to be done regarding her oral pain from dental implants. SLP to follow patient a little more but do not anticipate significant change in function with direct and frequent SLP intervention.   Pain Pain Assessment Pain Scale: Faces Faces Pain Scale: Hurts even more Pain Type: Acute pain Pain Location: Abdomen Pain Descriptors / Indicators: Aching;Discomfort;Grimacing Pain Onset: On-going Pain Intervention(s): Other (Comment) (RN already aware and in room)  Therapy/Group: Individual Therapy   Sonia Baller, MA, CCC-SLP Speech Therapy

## 2020-11-12 NOTE — Progress Notes (Signed)
Occupational Therapy Weekly Progress Note  Patient Details  Name: [name] [name] MRN: [id number] Date of Birth: 1930/08/05  Beginning of progress report period: November 06, 2020 End of progress report period: Nov 12, 2020  Today's Date: 11/12/2020 OT Individual Time: 0131-4388 OT Individual Time Calculation (min): 43 min    Patient has met 0 of 3 short term goals.  Pt has made limited progress with OT services, able to perform toilet transfers using grab bar with Min/Mod A, LB dressing Max A, Max A for clothing management during toileting, and has tolerated up to 1 hour OOB up in wheelchair. Focus of session has been on positioning, pressure relief, and DC planning in prep for SNF/subacute rehab. Pt has been severely limited in participation d/t fatigue, weakness, and discomfort at PEG tube site.  Patient continues to demonstrate the following deficits: muscle weakness, decreased cardiorespiratoy endurance, impaired timing and sequencing, unbalanced muscle activation, decreased coordination and decreased motor planning and decreased sitting balance, decreased standing balance, decreased postural control and decreased balance strategies and therefore will continue to benefit from skilled OT intervention to enhance overall performance with BADL and Reduce care partner burden.  Patient not progressing toward long term goals.  See goal revision..  Plan of care revisions: include downgrading LB dressing, bathing, toileting goals. Pt planning DC to SNF for further rehab/sub acute setting. .  OT Short Term Goals Week 1:  OT Short Term Goal 1 (Week 1): Pt will complete LB dressing with Mod A OT Short Term Goal 1 - Progress (Week 1): Progressing toward goal OT Short Term Goal 2 (Week 1): Pt will complete 1 grooming task while standing at the sink to increase standing endurance OT Short Term Goal 2 - Progress (Week 1): Not met OT Short Term Goal 3 (Week 1): Pt will complete sit<stand during toileting with  Min A and LRAD OT Short Term Goal 3 - Progress (Week 1): Progressing toward goal Week 2:  OT Short Term Goal 1 (Week 2): STGs = LTGs d/t ELOS  Skilled Therapeutic Interventions/Progress Updates:    Pt greeted at time of session side lying in bed agreeable to OT session, RN stating new air mattress arrived and plan to switch out bed. Pt agreeable. Donned pants bed level Max A with bridge technique to lift buttocks prior to bed mobility. Side lying > sitting on L side of bed Min A, stand pivot > wheelchair Mod A. Switched over to portable O2 while nursing changed bed. During this time assisting pt with grooming tasks with Min A.Set up wheelchair at sink and pt washing face Set up. Needing to toilet urgently, set up at toilet and stand pivot with grab bar Min A and Max A for clothing management d/t urgency and weakness. (+) void for urine and able to perform peri hygiene Supervision seated. Partial stand pivot back to wheelchair Min/Mod A. Pt wanting to sit up in wheelchair for an hour to be OOB, set up with alarm on call bell in reach and pillow behind back for soft surface d/t bony prominence. Communicated with nursing how pt tranfers and that she wanted to sit up for 1 hour.   Therapy Documentation Precautions:  Precautions Precautions: Fall Precaution Comments: abdominal binder to protect PEG site, oral thrush/mucositis and mouth ulceration, 2L 02 Restrictions Weight Bearing Restrictions: No   Therapy/Group: Individual Therapy  Viona Gilmore 11/12/2020, 12:28 PM

## 2020-11-12 NOTE — Progress Notes (Signed)
Physical Therapy Weekly Progress Note  Patient Details  Name: SKILER TYE MRN: [id number] Date of Birth: 12-16-1930  Beginning of progress report period: November 06, 2020 End of progress report period: Nov 12, 2020  Today's Date: 11/12/2020 PT Individual Time: 1330-1340 PT Individual Time Calculation (min): 10 min   Patient has met 0 of 3 short term goals.  Pt has unfortunately not met any of her STG 2/2 poor tolerance to activity, global weakness, and pain limiting participation with PT. Pt very pleasant and motivated but has shown limited ability to participate in therapy.   Patient continues to demonstrate the following deficits muscle weakness, decreased cardiorespiratoy endurance, decreased coordination and decreased sitting balance, decreased standing balance, decreased postural control and decreased balance strategies and therefore will continue to benefit from skilled PT intervention to increase functional independence with mobility.  Patient progressing toward long term goals..  Continue plan of care.  PT Short Term Goals Week 1:  PT Short Term Goal 1 (Week 1): pt to demonstrated supine<>sit min A PT Short Term Goal 1 - Progress (Week 1): Not met PT Short Term Goal 2 (Week 1): pt to demosntrated functional transfer min A with LRAD PT Short Term Goal 2 - Progress (Week 1): Not met PT Short Term Goal 3 (Week 1): pt to demonstrate gait 25' min A with LRAD PT Short Term Goal 3 - Progress (Week 1): Not met Week 2:  PT Short Term Goal 1 (Week 2): pt to demonstrate supine<>sit min A PT Short Term Goal 2 (Week 2): pt to demonstrate functional transfers min A consistently PT Short Term Goal 3 (Week 2): pt to initiate gait  Skilled Therapeutic Interventions/Progress Updates:    pt received in bed and reported she had just woken from a nap and had just started eating lunch. Pt received in very poor position to attempt eating, agreeable to therapy assisting for improved positioning. Pt  directed in upward scooting in bed min A and repositioning of bed with functions for more upright sitting to eat food. Pt demonstrated improved ability to self feed and visitor present. Pt politely denied to further participate in therapy reporting greatly increased pain in abdomen and unable to tolerate additional mobility. Pt left in good position, All needs in reach and in good condition. Call light in hand.  visitor present.   Therapy Documentation Precautions:  Precautions Precautions: Fall Precaution Comments: abdominal binder to protect PEG site, oral thrush/mucositis and mouth ulceration, 2L 02 Restrictions Weight Bearing Restrictions: No General: PT Amount of Missed Time (min): 35 Minutes PT Missed Treatment Reason: Patient fatigue;Patient unwilling to participate;Pain Vital Signs:   Pain: Pain Assessment Pain Scale: 0-10 Pain Score: 9  Pain Type: Acute pain Pain Location: Abdomen Pain Orientation: Medial Pain Descriptors / Indicators: Aching;Constant;Throbbing Pain Frequency: Constant Pain Onset: On-going Patients Stated Pain Goal: 0 Pain Intervention(s): Medication (See eMAR);Repositioned Multiple Pain Sites: Yes 2nd Pain Site Pain Score: 10 Pain Type: Acute pain Pain Location: Mouth Pain Descriptors / Indicators: Aching;Sharp;Throbbing Pain Frequency: Constant Pain Onset: On-going Patient's Stated Pain Goal: 0 Pain Intervention(s): Medication (See eMAR) Vision/Perception     Mobility:   Locomotion :    Trunk/Postural Assessment :    Balance:   Exercises:   Other Treatments:     Therapy/Group: Individual Therapy  Junie Panning 11/12/2020, 2:05 PM

## 2020-11-12 NOTE — Consult Note (Signed)
WOC Nurse Consult Note: Reason for Consult: Heavy drainage from PEG tube I modified the wound care dressing to include a Drawtex dressing Kellie Simmering [phone]) for better effluent absorption. [place] nurse will not follow at this time.  Please re-consult the [facility] team if needed.  Val Riles, RN, MSN, CWOCN, CNS-BC, pager [phone]

## 2020-11-12 NOTE — Progress Notes (Addendum)
PROGRESS NOTE   Subjective/Complaints:   BMI 15.02 today- was 15.1- but ate 25% of lunch and supper yesterday, which is better- nurse said giving pain meds q4 hours usually-  Pt reports gums/mouth hurts-  Also per nurse, gastric contents burning skin around PEG site- due to lack of body weight keeping contents from coming out- AROUND tube.   Asked WOC to see pt and they made changed to orders.   ROS:  Pt denies SOB, abd pain, CP, N/V/C/D, and vision changes  Objective:   No results found. No results for input(s): WBC, HGB, HCT, PLT in the last 72 hours. Recent Labs    11/11/20 0506  CREATININE 0.56    Intake/Output Summary (Last 24 hours) at 11/12/2020 1052 Last data filed at 11/12/2020 0847 Gross per 24 hour  Intake 240 ml  Output --  Net 240 ml       Physical Exam: Vital Signs Blood pressure 101/62, pulse 81, temperature 97.9 F (36.6 C), resp. rate 16, height 5\' 4"  (1.626 m), weight 39.7 kg, SpO2 99 %.      General: awake, sitting up holding mouth, severely cachetic, NAD HENT: conjugate gaze; oropharynx moist CV: regular rate; no JVD Pulmonary: CTA B/L; no W/R/R-- O2 by [place]- - decreased at bases B/L GI: soft, NT, ND, (+)BS Psychiatric: appropriate- frustrated about pain, but says overall better- also happy ate more yesterday Neurological: Oalert- asking when going to SNF  Musculoskeletal:     Cervical back: Normal range of motion and neck supple.     Comments: No edema or tenderness in extremities  Skin:    Comments: Stage II 3 tiny spots -2 on coccyx, 1 on inner L buttock- no change today Ulcer on left upper posterior pelvis Abdominal open wound C/D/I- not assessed today, but is C/D/I today PEG site- burning skin around PEG- due to gastric contents  Neurological:     Mental Status: She is alert.     Comments: Alert and oriented HOH Motor: B/l UE: 4+/5 proximal to distal RLE: 4/5 proximal to  distal LLE: 4/5 HF, KE, 3-/5 ADF     Assessment/Plan: 1. Functional deficits which require 3+ hours per day of interdisciplinary therapy in a comprehensive inpatient rehab setting.  Physiatrist is providing close team supervision and 24 hour management of active medical problems listed below.  Physiatrist and rehab team continue to assess barriers to discharge/monitor patient progress toward functional and medical goals  Care Tool:  Bathing    Body parts bathed by patient: Right arm,Left arm,Chest,Abdomen,Front perineal area,Buttocks,Right upper leg,Left upper leg,Face   Body parts bathed by helper: Right lower leg,Left lower leg     Bathing assist Assist Level: Minimal Assistance - Patient > 75%     Upper Body Dressing/Undressing Upper body dressing   What is the patient wearing?: Pull over shirt    Upper body assist Assist Level: Minimal Assistance - Patient > 75%    Lower Body Dressing/Undressing Lower body dressing      What is the patient wearing?: Incontinence brief,Pants     Lower body assist Assist for lower body dressing: Maximal Assistance - Patient 25 - 49%     Toileting  Toileting    Toileting assist Assist for toileting: Moderate Assistance - Patient 50 - 74% Assistive Device Comment: incontinent brief   Transfers Chair/bed transfer  Transfers assist  Chair/bed transfer activity did not occur: Safety/medical concerns  Chair/bed transfer assist level: Moderate Assistance - Patient 50 - 74%     Locomotion Ambulation   Ambulation assist   Ambulation activity did not occur: Safety/medical concerns          Walk 10 feet activity   Assist  Walk 10 feet activity did not occur: Safety/medical concerns        Walk 50 feet activity   Assist Walk 50 feet with 2 turns activity did not occur: Safety/medical concerns         Walk 150 feet activity   Assist Walk 150 feet activity did not occur: Safety/medical concerns          Walk 10 feet on uneven surface  activity   Assist Walk 10 feet on uneven surfaces activity did not occur: Safety/medical concerns         Wheelchair     Assist Will patient use wheelchair at discharge?: Yes (pt will likely require WC at DC however pt unable to tolerate WC transfer or assessment on this date) Type of Wheelchair: Manual Wheelchair activity did not occur: Safety/medical concerns         Wheelchair 50 feet with 2 turns activity    Assist    Wheelchair 50 feet with 2 turns activity did not occur: Safety/medical concerns       Wheelchair 150 feet activity     Assist  Wheelchair 150 feet activity did not occur: Safety/medical concerns       Blood pressure 101/62, pulse 81, temperature 97.9 F (36.6 C), resp. rate 16, height 5\' 4"  (1.626 m), weight 39.7 kg, SpO2 99 %.  Medical Problem List and Plan: 1.  Debility secondary to abdominal wall cellulitis/sepsis complicated by chronic hypoxic respiratory failure/RA             -patient may not shower             -ELOS/Goals: 14-17 days/Min A             will change to 1x/day of PT and OT- and look for SNF bed- since so low function per pt and PT/OT  Con't  PT and OT- needs SNF placement- only doing 1x/day.   -con't PT and OT at less frequent schedule 2.  Impaired mobility -DVT/anticoagulation: Continue Lovenox             -antiplatelet therapy: N/A 3. Pain Management: Tylenol as needed  4/29- tried tramadol - didn't work- and try Norco 5/325 mg q4 hours prn  4/30: having 10/10 pain with dressing change, continue Norco and give 30 minutes prior to dressing change, Caryl Pina will pass onto oncoming nurse  5/1: AST elevated and pain prohibiting participation with therapy. Switch to oxycodone for better pain control and less tylenol.   5/2- AST is 42- very slightly elevated, likely from frequent tylenol usage- will ask nursing to use Oxy and not tylenol for pain.   5/3- changed Tylenol order only for  fever- con't oxy which pt feels is real helpful for gum pain.   5/4- reinforced to nursing- con't regimen  5/5- getting pain meds around the clock- con't regimen working slightly better 4. Mood: Remeron 15 mg nightly  5/3- will change to 30 mg QHS- is better dose for appetite             -  antipsychotic agents: N/A 5. Neuropsych: This patient is capable of making decisions on her own behalf. 6. Skin/Wound Care: Routine skin checks.  Foam dressing to umbilicus and change every 3 days as needed soiling.  Apply saline moistened 2 x 2 to abdomen wound daily cover with dry 2 x 2 and tape.  Apply dry split thickness gauze underneath G-tube daily.  4/30: reconsulted wound care as PEG site with some leaking TF  5/2- said it's due to fistula- won't follow, but remain available. Also has 3- Stage II small spots on coccyx x2 and 1 on L inner buttock- con't wound care/foam  5/4- will put on air mattress since not moving and has 3 Stage II's-  5/5- spoke to nurse- they will get pt air mattress due to BMI 15- extrmely cachetic and has pressure ulcers x3 Stage II on backside.   7. Fluids/Electrolytes/Nutrition: Routine in and outs.             CMP ordered 8.  Rheumatoid arthritis.  Continue prednisone. 9.  Chronic hypoxic respiratory failure.  Oxygen therapy as directed continue chronic prednisone.             Monitor O2 sats with increased exertion 10.  Dysphagia.  Dysphagia #1 thin liquids.  G-tube replaced 10/29/2020 per interventional radiology.  Dietary follow-up             Advance diet as tolerated  4/29- PEG- fell out- got back in by radiology-but has high risk of coming out- came out with balloon intact- con't to monitor   5/5- gastric contents burning skin- WOC consulted- will change orders.  11.  Thrush/mouth pain/odynophagia.  Follow Dr. Benson Norway  4/29- try Norco/Oxycodone- 5/325 mg Q4 hours prn- thrush appears to be improved. If doesn't improve, will change to Oxycodone- has no bad reactions to pain  meds in past.   5/2- con't oxy prn- don't use tylenol  5/3- oxy making a big difference- con't oxy- changed tylenol to be only for fever 12. Severe malnutrition  4/29- con't PEG; TFS nightly- BMI 13.6- con't to push PO- but limited by gum pain.   5/2- BMI up to 13.93-   5/3- BMI up to 14.72 today? Not sure if true or not, appears the same- con't regimen  5/4- BMI up to 15  5/5- BMI 15.02 from 15.1 yesterday- eating 25% of meals yesterday- is better- gum pain and appetite slightly improved.  13. Hypotension-   4/29- BP running 80s/40s-will try Low dose Midodrine- 2.5 mg TID with meals- for lower BP.   5/3- BP 90s/50s- better- no dizziness- con't regimen  5/4 will increase to 5 mg TID with meals- since BP 80/s40s again  5/5- BP somewhat better- 100s/60s- con't regimen 14. Loose stools: likely 2/2 TF, continue to monitor 15. Iron deficiency: started iron supplement    I spent a total of 35 minutes on visit- >50% coordination of care.  Discussed with POA about her weight, pain meds, sedation, skin issues/wounds; and SNF plans.  Pain multi-factorial- wounds and gum/mouth pain- and summarized all the medical issues that have been going on.    LOS: 8 days A FACE TO FACE EVALUATION WAS PERFORMED  Aniston Christman 11/12/2020, 10:52 AM

## 2020-11-12 NOTE — NC FL2 (Signed)
[place] LEVEL OF CARE SCREENING TOOL     IDENTIFICATION  Patient Name: [name] [name] Birthdate: [birth year] Sex: female Admission Date (Current Location): 11/04/2020  [facility] and [place] Number:  Herbalist and Address:  The [place], [place] [address], [place], [place] [address]      Provider Number: [id number]  Attending Physician Name and Address:  Courtney Heys, MD  Relative Name and Phone Number:  Marcelo Baldy sister in-law [phone]    Current Level of Care: Other (Comment) (rehab) Recommended Level of Care: [place] Prior Approval Number:    Date Approved/Denied:   PASRR Number: [id number]  Discharge Plan: SNF    Current Diagnoses: Patient Active Problem List   Diagnosis Date Noted  . Debility 11/04/2020  . Dysphagia   . Oral thrush   . Encounter for dental examination   . Facial swelling   . Atrophy of edentulous alveolar ridge   . Abdominal wall cellulitis 10/24/2020  . Severe sepsis ([place]) 10/23/2020  . Abdominal pain 10/20/2020  . Diarrhea 10/02/2020  . Pressure injury of skin 09/06/2020  . Acute on chronic respiratory failure with hypoxemia ([place]) 08/23/2020  . Protein-calorie malnutrition, severe 08/20/2020  . Fall 01/25/2020  . Other fatigue 01/03/2020  . Left shoulder pain 01/03/2020  . Urinary frequency 01/03/2020  . Acute otalgia, left 09/06/2019  . Unintentional weight loss 07/18/2019  . Personal history of PE (pulmonary embolism) 04/23/2019  . Headache 02/11/2019  . Iron deficiency anemia 12/21/2018  . Cold sore 10/23/2018  . Blood in stool 09/14/2018  . 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 Therapeutic drug monitoring 07/17/2018  . Chronic diastolic CHF (congestive heart failure) ([place]) 12/19/2017  . Rash 08/11/2017  . Chronic respiratory failure with hypoxia ([place]) 03/30/2017  . Angular cheilitis 03/08/2017  . Leg pain 10/25/2016  . Back pain 08/05/2016  . RA  (rheumatoid arthritis) ([place]) 03/31/2015  . Allergic rhinitis   . Varicose vein 09/11/2014  . ILD (interstitial lung disease) ([place]) 06/25/2012  . Chest pain 02/27/2012  . CAD (coronary artery disease) 02/27/2012  . Pruritus 08/18/2009  . Postinflammatory pulmonary fibrosis / RA ILD  06/30/2008  . Constipation 01/03/2008  . Dyslipidemia 06/16/2007    Orientation RESPIRATION BLADDER Height & Weight     Self,Time,Situation,Place  O2 (2 liters continuous) Incontinent,Continent Weight: 87 lb 8.4 oz (39.7 kg) Height:  5\' 4"  (162.6 cm)  BEHAVIORAL SYMPTOMS/MOOD NEUROLOGICAL BOWEL NUTRITION STATUS      Continent,Incontinent Feeding tube,Diet (abdominal wound wet to dry BID-soft diet-thin liquids)  AMBULATORY STATUS COMMUNICATION OF NEEDS Skin   Extensive Assist Verbally PU Stage and Appropriate Care   PU Stage 2 Dressing: BID (sacral)                   Personal Care Assistance Level of Assistance  Bathing,Dressing Bathing Assistance: Limited assistance Feeding assistance: Independent Dressing Assistance: Limited assistance     Functional Limitations Info  Speech,Sight,Hearing Sight Info: Adequate Hearing Info: Adequate Speech Info: Adequate    SPECIAL CARE FACTORS FREQUENCY  PT (By licensed PT),OT (By licensed OT),Speech therapy,Bowel and bladder program     PT Frequency: 5 x week OT Frequency: 5 x week Bowel and Bladder Program Frequency: Timed tolieting every 2-3 hours   Speech Therapy Frequency: 5x week      Contractures Contractures Info: Not present    Additional Factors Info  Code Status,Allergies Code Status Info: Full Code Allergies Info: Crestor, Lactose intolerance, Penicillins and Imuran  Current Medications (11/12/2020):  This is the current hospital active medication list Current Facility-Administered Medications  Medication Dose Route Frequency Provider Last Rate Last Admin  . acetaminophen (TYLENOL) tablet 650 mg  650 mg Oral Q6H PRN  Lovorn, Megan, MD   650 mg at 11/10/20 1031   Or  . acetaminophen (TYLENOL) suppository 650 mg  650 mg Rectal Q6H PRN Lovorn, Megan, MD      . benzocaine (ORAJEL) 10 % mucosal gel   Mouth/Throat PRN Courtney Heys, MD   Given at 11/08/20 1020  . chlorhexidine (PERIDEX) 0.12 % solution 15 mL  15 mL Mouth/Throat QID Cathlyn Parsons, PA-C   15 mL at 11/12/20 0745  . dronabinol (MARINOL) capsule 2.5 mg  2.5 mg Oral QHS Lovorn, Megan, MD   2.5 mg at 11/11/20 2157  . enoxaparin (LOVENOX) 100 mg/mL injection 20 mg  20 mg Subcutaneous Q24H Pham, Minh Q, RPH-CPP   20 mg at 11/12/20 0749  . famotidine (PEPCID) 40 MG/5ML suspension 20 mg  20 mg Per Tube Daily AngiulliLavon Paganini, PA-C   20 mg at 11/12/20 0748  . feeding supplement (BOOST / RESOURCE BREEZE) liquid 1 Container  1 Container Oral BID WC Courtney Heys, MD   1 Container at 11/12/20 [phone]  . feeding supplement (ENSURE ENLIVE / ENSURE PLUS) liquid 237 mL  237 mL Oral Q24H Lovorn, Megan, MD   237 mL at 11/11/20 1713  . feeding supplement (OSMOLITE 1.5 CAL) liquid 840 mL  840 mL Per Tube Q24H Lovorn, Megan, MD 70 mL/hr at 11/11/20 1901 Infusion Verify at 11/11/20 1901  . feeding supplement (PROSource TF) liquid 45 mL  45 mL Per Tube Daily Lovorn, Megan, MD   45 mL at 11/12/20 0750  . fluticasone (FLONASE) 50 MCG/ACT nasal spray 2 spray  2 spray Each Nare Daily PRN Cathlyn Parsons, PA-C   2 spray at 11/08/20 1021  . insulin aspart (novoLOG) injection 0-6 Units  0-6 Units Subcutaneous Q4H Cathlyn Parsons, PA-C   1 Units at 11/05/20 2048  . lipase/protease/amylase (CREON) capsule 36,000 Units  36,000 Units Oral TID AC Cathlyn Parsons, PA-C   36,000 Units at 11/11/20 1713  . magic mouthwash w/lidocaine  5 mL Oral TID Cathlyn Parsons, PA-C   5 mL at 11/12/20 0750  . midodrine (PROAMATINE) tablet 5 mg  5 mg Oral TID WC Lovorn, Megan, MD   5 mg at 11/12/20 0751  . mirtazapine (REMERON SOL-TAB) disintegrating tablet 30 mg  30 mg Oral QHS Lovorn,  Megan, MD   30 mg at 11/11/20 2158  . multivitamins with iron tablet 1 tablet  1 tablet Oral Daily Raulkar, Clide Deutscher, MD   1 tablet at 11/12/20 0749  . mycophenolate (CELLCEPT) oral suspension 50 mg/mL PATIENT SUPPLIED  1,000 mg Per Tube BID Lovorn, Megan, MD      . Nintedanib (Ofev) CAPS 100 mg  100 mg Oral BID Cathlyn Parsons, PA-C   100 mg at 11/12/20 0751  . ondansetron (ZOFRAN) tablet 4 mg  4 mg Oral Q6H PRN Cathlyn Parsons, PA-C   4 mg at 11/09/20 3419   Or  . ondansetron (ZOFRAN) injection 4 mg  4 mg Intravenous Q6H PRN Angiulli, Lavon Paganini, PA-C      . oxyCODONE (Oxy IR/ROXICODONE) immediate release tablet 5 mg  5 mg Oral Q4H PRN Raulkar, Clide Deutscher, MD   5 mg at 11/12/20 0746  . predniSONE (DELTASONE) tablet 5 mg  5 mg Oral  Q breakfast AngiulliLavon Paganini, PA-C   5 mg at 11/12/20 0746  . Zinc Oxide (TRIPLE PASTE) 12.8 % ointment   Topical BID Raulkar, Clide Deutscher, MD   Given at 11/12/20 2992     Discharge Medications: Please see discharge summary for a list of discharge medications.  Relevant Imaging Results:  Relevant Lab Results:   Additional Information SSN: [id number] has been vaccinated and boosted-will need low air loss mattreess due to frail and skin integrity  Jaquayla Hege, Gardiner Rhyme, LCSW

## 2020-11-12 NOTE — Progress Notes (Addendum)
Patient ID: [name] [name], female   DOB: [birth year], 87 y.o.   MRN: [id number] Spoke with Deberah Castle voiced concerns over abdominal wound getting worse and should she ask surgeon to come in and see and the NSF's they desire for pt to go to. Pt is experiencing pain now with this wound and was not a few days ago. Informed her will text MD to ask her to cal her to address her concerns. Aware pt will need to be medically stable to go to a SNF.  3:14 PM Met with pt to discuss places she would like worker to look into. She feels like she is getting worse instead of better and her pain is back now. She is discouraged by this. Continue to question palliative to re-consult due to pt seems worse and wound is not healing.

## 2020-11-13 LAB — GLUCOSE, CAPILLARY
Glucose-Capillary: 101 mg/dL — ABNORMAL HIGH (ref 70–99)
Glucose-Capillary: 106 mg/dL — ABNORMAL HIGH (ref 70–99)
Glucose-Capillary: 112 mg/dL — ABNORMAL HIGH (ref 70–99)
Glucose-Capillary: 124 mg/dL — ABNORMAL HIGH (ref 70–99)
Glucose-Capillary: 127 mg/dL — ABNORMAL HIGH (ref 70–99)
Glucose-Capillary: 98 mg/dL (ref 70–99)

## 2020-11-13 MED ORDER — GABAPENTIN 400 MG PO CAPS
400.0000 mg | ORAL_CAPSULE | Freq: Every day | ORAL | Status: DC
  Administered 2020-11-13 – 2020-11-15 (×2): 400 mg via ORAL
  Filled 2020-11-13 (×2): qty 1

## 2020-11-13 NOTE — Progress Notes (Signed)
Occupational Therapy Session Note  Patient Details  Name: [name] [name] MRN: [id number] Date of Birth: August 10, 1930  Today's Date: 11/13/2020 OT Individual Time: 1006-1046 OT Individual Time Calculation (min): 40 min   Short Term Goals: Week 2:  OT Short Term Goal 1 (Week 2): STGs = LTGs d/t ELOS  Skilled Therapeutic Interventions/Progress Updates:    Pt greeted in bed, satting at 99% on 2L. She was agreeable to sit at the sink to complete oral care. Mod A for supine<sit with HOB elevated, pt needing assistance to scoot forward. We donned her abdominal binder for PEG safety, pt very sensitive to how tightly binder was fastened so she did it herself. Mod A for stand pivot<w/c where pt reported intense pain from PEG site, from tape in particular. Per pt, "they put too much on." Pt pulling at tape throughout session with cues to stop for PEG safety. Pt at first adamant that she wanted to return to bed post transfer but OT guided her through diaphragmatic breathing exercises and then pt reported she felt "calmer." Agreeable to remain sitting up. Oral care completed with setup assist and then she reported needing to void bladder, Mod A for stand pivot<elevated toilet using the grab bar and Max A for clothing mgt. Note that pt had voided a little in the brief before fully voiding in toilet. After hygiene, we donned clean brief and then returned to the w/c. She washed hands with sanitizer and then returned to bed. Mod A to return to semi reclined position. She remained in bed, left in care of RN to redress PEG dressings for improved comfort. Reminded pt to tell nursing if she needed pain medicine before they left. Pt verbalized understanding.   Due to pt reporting having supplemental 02 off during a majority of self care activity at home, she participated in tx without 02. Note that when she returned to bed pt was satting at 88-89%. When 02 was reapplied 02 sats quickly increased to 91% on 2L.   Therapy  Documentation Precautions:  Precautions Precautions: Fall Precaution Comments: abdominal binder to protect PEG site, oral thrush/mucositis and mouth ulceration, 2L 02 Restrictions Weight Bearing Restrictions: No Pain: Pain Assessment Pain Scale: 0-10 Pain Score: 0-No pain ADL: ADL Eating: Not assessed Grooming: Setup Where Assessed-Grooming: Edge of bed Upper Body Bathing: Setup Where Assessed-Upper Body Bathing: Edge of bed Lower Body Bathing: Minimal assistance Where Assessed-Lower Body Bathing: Other (Comment) (BSC) Upper Body Dressing: Minimal assistance Where Assessed-Upper Body Dressing: Other (Comment) (sitting on BSC) Lower Body Dressing: Dependent Where Assessed-Lower Body Dressing: Other (Comment) (sit<stand from [facility]) Toileting: Moderate assistance Where Assessed-Toileting: Bedside Commode Toilet Transfer: Moderate assistance Toilet Transfer Method: Stand pivot (without AD) Toilet Transfer Equipment: Bedside commode Tub/Shower Transfer: Not assessed      Therapy/Group: Individual Therapy  Newell Frater A Brycen Bean 11/13/2020, 12:19 PM

## 2020-11-13 NOTE — Progress Notes (Signed)
Speech Language Pathology Weekly Progress and Session Note  Patient Details  Name: [name] [name] MRN: [id number] Date of Birth: 12-17-1930  Beginning of progress report period: 11/05/2020 End of progress report period: 11/13/2020  Today's Date: 11/13/2020 SLP Individual Time:  -     Short Term Goals: Week 1: SLP Short Term Goal 1 (Week 1): Patient will demonstrated toleration of current diet Dys 1 puree solids and thin liquids without overt s/s aspiration or penetration. SLP Short Term Goal 1 - Progress (Week 1): Met SLP Short Term Goal 2 (Week 1): Patient will tolerate trials of upgraded solids with adequate mastication and oral phase of swallow with supervision A. SLP Short Term Goal 2 - Progress (Week 1): Met SLP Short Term Goal 3 (Week 1): Patient will participate in cognitive-linguistic and speech evaluation to determine need for development of tx goals. SLP Short Term Goal 3 - Progress (Week 1): Progressing toward goal    New Short Term Goals: Week 2: SLP Short Term Goal 1 (Week 2): STG=LTG (awaiting d/c SNF)  Weekly Progress Updates:  Patient met 2/3 STG's and is now tolerating Regular solids, thin liquids but with regular solids being those that she and her sister in law have chosen and are soft in texture. Patient continues with oral pain which has limited her PO intake and ability to participate fully. SLP to determine if benefit from full cognitive evaluation versus discharged ST services.   Intensity: Minumum of 1-2 x/day, 30 to 90 minutes Frequency: 3 to 5 out of 7 days Duration/Length of Stay: 2 weeks Treatment/Interventions: Cognitive remediation/compensation;Dysphagia/aspiration precaution training;Patient/family education     Sonia Baller, MA, CCC-SLP Speech Therapy

## 2020-11-13 NOTE — Progress Notes (Signed)
PROGRESS NOTE   Subjective/Complaints:   Pt reports gum pain is stabbing- 9/10 this AM- ate 1/2 muffin and 1/2 boiled egg for breakfast- more than normal- feeling more hungry.  Willing to try nerve pain meds for gum pain.    ROS:  Pt denies SOB, abd pain, CP, N/V/C/D, and vision changes  Objective:   No results found. No results for input(s): WBC, HGB, HCT, PLT in the last 72 hours. Recent Labs    11/11/20 0506  CREATININE 0.56    Intake/Output Summary (Last 24 hours) at 11/13/2020 0820 Last data filed at 11/12/2020 0847 Gross per 24 hour  Intake 120 ml  Output --  Net 120 ml       Physical Exam: Vital Signs Blood pressure 117/71, pulse 83, temperature 98.4 F (36.9 C), resp. rate 17, height 5\' 4"  (1.626 m), weight 33.1 kg, SpO2 100 %.       General: awake, alert, appropriate, sitting up- initially asleep, but woke and started eating; BMi recorded as 12.53- not sure that's right? NAD HENT: conjugate gaze; oropharynx moist CV: regular rate; no JVD Pulmonary: CTA B/L; no W/R/R- good air movement- wearing O2 by [place]-  GI: soft, NT, ND, (+)BS- very cachetic; PEG in place- dressing for abd wound and around PEG site- C/D/I Psychiatric: appropriate- interactive once awake Neurological: alert  Musculoskeletal:     Cervical back: Normal range of motion and neck supple.     Comments: No edema or tenderness in extremities  Skin:    Comments: Stage II 3 tiny spots -2 on coccyx, 1 on inner L buttock- no change today Ulcer on left upper posterior pelvis Abdominal open wound C/D/I- not assessed today, but is C/D/I today PEG site- burning skin around PEG- due to gastric contents  Neurological:     Mental Status: She is alert.     Comments: Alert and oriented HOH Motor: B/l UE: 4+/5 proximal to distal RLE: 4/5 proximal to distal LLE: 4/5 HF, KE, 3-/5 ADF     Assessment/Plan: 1. Functional deficits which require  3+ hours per day of interdisciplinary therapy in a comprehensive inpatient rehab setting.  Physiatrist is providing close team supervision and 24 hour management of active medical problems listed below.  Physiatrist and rehab team continue to assess barriers to discharge/monitor patient progress toward functional and medical goals  Care Tool:  Bathing    Body parts bathed by patient: Right arm,Left arm,Chest,Abdomen,Front perineal area,Buttocks,Right upper leg,Left upper leg,Face   Body parts bathed by helper: Right lower leg,Left lower leg     Bathing assist Assist Level: Minimal Assistance - Patient > 75%     Upper Body Dressing/Undressing Upper body dressing   What is the patient wearing?: Pull over shirt    Upper body assist Assist Level: Minimal Assistance - Patient > 75%    Lower Body Dressing/Undressing Lower body dressing      What is the patient wearing?: Incontinence brief,Pants     Lower body assist Assist for lower body dressing: Maximal Assistance - Patient 25 - 49%     Toileting Toileting    Toileting assist Assist for toileting: Moderate Assistance - Patient 50 - 74% Assistive  Device Comment: incontinent brief   Transfers Chair/bed transfer  Transfers assist  Chair/bed transfer activity did not occur: Safety/medical concerns  Chair/bed transfer assist level: Moderate Assistance - Patient 50 - 74%     Locomotion Ambulation   Ambulation assist   Ambulation activity did not occur: Safety/medical concerns          Walk 10 feet activity   Assist  Walk 10 feet activity did not occur: Safety/medical concerns        Walk 50 feet activity   Assist Walk 50 feet with 2 turns activity did not occur: Safety/medical concerns         Walk 150 feet activity   Assist Walk 150 feet activity did not occur: Safety/medical concerns         Walk 10 feet on uneven surface  activity   Assist Walk 10 feet on uneven surfaces activity  did not occur: Safety/medical concerns         Wheelchair     Assist Will patient use wheelchair at discharge?: Yes (pt will likely require WC at DC however pt unable to tolerate WC transfer or assessment on this date) Type of Wheelchair: Manual Wheelchair activity did not occur: Safety/medical concerns         Wheelchair 50 feet with 2 turns activity    Assist    Wheelchair 50 feet with 2 turns activity did not occur: Safety/medical concerns       Wheelchair 150 feet activity     Assist  Wheelchair 150 feet activity did not occur: Safety/medical concerns       Blood pressure 117/71, pulse 83, temperature 98.4 F (36.9 C), resp. rate 17, height 5\' 4"  (1.626 m), weight 33.1 kg, SpO2 100 %.  Medical Problem List and Plan: 1.  Debility secondary to abdominal wall cellulitis/sepsis complicated by chronic hypoxic respiratory failure/RA             -patient may not shower             -ELOS/Goals: 14-17 days/Min A             will change to 1x/day of PT and OT- and look for SNF bed- since so low function per pt and PT/OT  -haven't found SNF yet- con't PT and OT 2.  Impaired mobility -DVT/anticoagulation: Continue Lovenox             -antiplatelet therapy: N/A 3. Pain Management: Tylenol as needed  4/29- tried tramadol - didn't work- and try Norco 5/325 mg q4 hours prn  4/30: having 10/10 pain with dressing change, continue Norco and give 30 minutes prior to dressing change, Caryl Pina will pass onto oncoming nurse  5/1: AST elevated and pain prohibiting participation with therapy. Switch to oxycodone for better pain control and less tylenol.   5/2- AST is 42- very slightly elevated, likely from frequent tylenol usage- will ask nursing to use Oxy and not tylenol for pain.   5/3- changed Tylenol order only for fever- con't oxy which pt feels is real helpful for gum pain.   5/4- reinforced to nursing- con't regimen  5/5- getting pain meds around the clock- con't regimen  working slightly better  5/6- will try Adding Gabapentin 400 mg QHS and see how does with pain.  4. Mood: Remeron 15 mg nightly  5/3- will change to 30 mg QHS- is better dose for appetite             -antipsychotic agents: N/A 5. Neuropsych:  This patient is capable of making decisions on her own behalf. 6. Skin/Wound Care: Routine skin checks.  Foam dressing to umbilicus and change every 3 days as needed soiling.  Apply saline moistened 2 x 2 to abdomen wound daily cover with dry 2 x 2 and tape.  Apply dry split thickness gauze underneath G-tube daily.  4/30: reconsulted wound care as PEG site with some leaking TF  5/2- said it's due to fistula- won't follow, but remain available. Also has 3- Stage II small spots on coccyx x2 and 1 on L inner buttock- con't wound care/foam  5/4- will put on air mattress since not moving and has 3 Stage II's-  5/5- spoke to nurse- they will get pt air mattress due to BMI 15- extrmely cachetic and has pressure ulcers x3 Stage II on backside.   7. Fluids/Electrolytes/Nutrition: Routine in and outs.             CMP ordered 8.  Rheumatoid arthritis.  Continue prednisone. 9.  Chronic hypoxic respiratory failure.  Oxygen therapy as directed continue chronic prednisone.             Monitor O2 sats with increased exertion 10.  Dysphagia.  Dysphagia #1 thin liquids.  G-tube replaced 10/29/2020 per interventional radiology.  Dietary follow-up             Advance diet as tolerated  4/29- PEG- fell out- got back in by radiology-but has high risk of coming out- came out with balloon intact- con't to monitor   5/5- gastric contents burning skin- WOC consulted- will change orders.  11.  Thrush/mouth pain/odynophagia.  Follow Dr. Benson Norway  4/29- try Norco/Oxycodone- 5/325 mg Q4 hours prn- thrush appears to be improved. If doesn't improve, will change to Oxycodone- has no bad reactions to pain meds in past.   5/2- con't oxy prn- don't use tylenol  5/3- oxy making a big difference-  con't oxy- changed tylenol to be only for fever 12. Severe malnutrition  4/29- con't PEG; TFS nightly- BMI 13.6- con't to push PO- but limited by gum pain.   5/2- BMI up to 13.93-   5/3- BMI up to 14.72 today? Not sure if true or not, appears the same- con't regimen  5/4- BMI up to 15  5/5- BMI 15.02 from 15.1 yesterday- eating 25% of meals yesterday- is better- gum pain and appetite slightly improved.  5/6- is eating more- BMI said 12.53 this AM- this makes no sense- she didn't drop 6 kg in 1 day? 13. Hypotension-   4/29- BP running 80s/40s-will try Low dose Midodrine- 2.5 mg TID with meals- for lower BP.   5/3- BP 90s/50s- better- no dizziness- con't regimen  5/4 will increase to 5 mg TID with meals- since BP 80/s40s again   5/6- BP much better 117/60s- con't regimen 14. Loose stools: likely 2/2 TF, continue to monitor 15. Iron deficiency: started iron supplement      LOS: 9 days A FACE TO FACE EVALUATION WAS PERFORMED  Voncile Schwarz 11/13/2020, 8:20 AM

## 2020-11-13 NOTE — Progress Notes (Signed)
Physical Therapy Session Note  Patient Details  Name: [name] [name] MRN: [id number] Date of Birth: 10-20-1930  Today's Date: 11/13/2020 PT Individual Time: 0354-6568 PT Individual Time Calculation (min): 23 min   Short Term Goals: Week 1:  PT Short Term Goal 1 (Week 1): pt to demonstrated supine<>sit min A PT Short Term Goal 1 - Progress (Week 1): Not met PT Short Term Goal 2 (Week 1): pt to demosntrated functional transfer min A with LRAD PT Short Term Goal 2 - Progress (Week 1): Not met PT Short Term Goal 3 (Week 1): pt to demonstrate gait 25' min A with LRAD PT Short Term Goal 3 - Progress (Week 1): Not met Week 2:  PT Short Term Goal 1 (Week 2): pt to demonstrate supine<>sit min A PT Short Term Goal 2 (Week 2): pt to demonstrate functional transfers min A consistently PT Short Term Goal 3 (Week 2): pt to initiate gait Week 3:     Skilled Therapeutic Interventions/Progress Updates:    pt received in bed and agreeable to therapy. Pt reported "some" pain at rest in abdomen. Pt directed in supine>sit EOB mod A with noted facial grimacing but improved once statically sitting in bed, pt denied to have abdominal binder any tighter than at rest 2/2 pain. Pt sat EOB 10 mins for hair brushing and washing face at min A-CGA. Pt then reported her pain as "it's getting to be far too much, I need to lay down." pt assisted returning to supine mod A for BLE management. Pt then min A for repositioning and reported 9/10 at abdomen, nursing made aware and at bedside for medication. Pillows placed for skin integrity pt reported increased comfort. Pt requested to not continue with PT at this time 2/2 greatly increased pain and needing to rest per pt. Pt left in bed, All needs in reach and in good condition. Call light in hand.     Therapy Documentation Precautions:  Precautions Precautions: Fall Precaution Comments: abdominal binder to protect PEG site, oral thrush/mucositis and mouth ulceration, 2L  02 Restrictions Weight Bearing Restrictions: No General: PT Amount of Missed Time (min): 22 Minutes PT Missed Treatment Reason: Patient fatigue;Patient unwilling to participate;Pain Vital Signs:   Pain: Pain Assessment Pain Scale: 0-10 Pain Score: 9  Pain Type: Acute pain Pain Location: Abdomen Pain Orientation: Medial Pain Descriptors / Indicators: Aching;Sharp;Nagging Pain Frequency: Constant Pain Onset: On-going Patients Stated Pain Goal: 0 Pain Intervention(s): Medication (See eMAR);Repositioned Multiple Pain Sites: No Mobility:   Locomotion :    Trunk/Postural Assessment :    Balance:   Exercises:   Other Treatments:      Therapy/Group: Individual Therapy  Junie Panning 11/13/2020, 1:35 PM

## 2020-11-14 LAB — GLUCOSE, CAPILLARY
Glucose-Capillary: 105 mg/dL — ABNORMAL HIGH (ref 70–99)
Glucose-Capillary: 105 mg/dL — ABNORMAL HIGH (ref 70–99)
Glucose-Capillary: 106 mg/dL — ABNORMAL HIGH (ref 70–99)
Glucose-Capillary: 111 mg/dL — ABNORMAL HIGH (ref 70–99)
Glucose-Capillary: 125 mg/dL — ABNORMAL HIGH (ref 70–99)
Glucose-Capillary: 142 mg/dL — ABNORMAL HIGH (ref 70–99)

## 2020-11-14 MED ORDER — DRONABINOL 2.5 MG PO CAPS
2.5000 mg | ORAL_CAPSULE | Freq: Two times a day (BID) | ORAL | Status: DC
  Administered 2020-11-14 – 2020-11-22 (×16): 2.5 mg via ORAL
  Filled 2020-11-14 (×16): qty 1

## 2020-11-14 MED ORDER — MYCOPHENOLATE 200 MG/ML ORAL SUSPENSION
1000.0000 mg | Freq: Two times a day (BID) | ORAL | Status: DC
  Administered 2020-11-14 – 2020-11-15 (×3): 1000 mg via ORAL
  Filled 2020-11-14 (×4): qty 20

## 2020-11-14 MED ORDER — SENNA 8.6 MG PO TABS
1.0000 | ORAL_TABLET | Freq: Two times a day (BID) | ORAL | Status: DC
  Administered 2020-11-14 – 2020-11-23 (×17): 8.6 mg via ORAL
  Filled 2020-11-14 (×18): qty 1

## 2020-11-14 MED ORDER — FAMOTIDINE 40 MG/5ML PO SUSR
20.0000 mg | Freq: Every day | ORAL | Status: DC
  Administered 2020-11-15 – 2020-11-23 (×9): 20 mg via ORAL
  Filled 2020-11-14 (×9): qty 2.5

## 2020-11-14 NOTE — Progress Notes (Signed)
Pt was educated to pain medication agreed to take some prior to dressing changes. Pt peg site still excoriated and painful dressing change as ordered Other two abdominal wounds treatment done. Called for hydrocolloid gel and informed patient that I will medicate her prior to dressings in morning. Peg tube is patent. Pt c/o pain medicated with relief. Pt is sleeping and appears comfortable. Pt has poor tolerance for dressing changes. Pt was allowed rest periods in between wound care.

## 2020-11-14 NOTE — Progress Notes (Signed)
PROGRESS NOTE   Subjective/Complaints:  Pt reports gums AND her abd wound both hurt 7-9/10- d/w abd wound with nursing- is granulated well- beefy red-  Assessed backside/other wounds with Nurse/Keesha-  LBM 5/3 found out- will increase bowel meds.   BMI up to 13.73 today? ROS:  Pt denies SOB, abd pain, CP, N/V/C/D, and vision changes  Objective:   No results found. No results for input(s): WBC, HGB, HCT, PLT in the last 72 hours. No results for input(s): NA, K, CL, CO2, GLUCOSE, BUN, CREATININE, CALCIUM in the last 72 hours.  Intake/Output Summary (Last 24 hours) at 11/14/2020 1448 Last data filed at 11/14/2020 0600 Gross per 24 hour  Intake 1080 ml  Output --  Net 1080 ml       Physical Exam: Vital Signs Blood pressure (!) 88/52, pulse 90, temperature 98.3 F (36.8 C), resp. rate 20, height 5\' 4"  (1.626 m), weight 36.3 kg, SpO2 95 %.        General: awake, alert, appropriate,extremely cachetic; laying on R side;  NAD HENT: conjugate gaze; oropharynx mdry- rubbing her gums still CV: regular rate; no JVD Pulmonary: CTA B/L; no W/R/R- good air movement GI: soft, NT, ND, (+)BS- abd wound C/D/I- is beefy red; granulating Psychiatric: appropriate- less interactive today Neurological: alert Musculoskeletal:     Cervical back: Normal range of motion and neck supple.     Comments: No edema or tenderness in extremities  Skin: Has a stage 3- tiny- less than pencil diameter, it's so small- qtip wouldn't fit easily- on coccyx -also has a barely open- very superficial spot on L inner buttock The R lumbar and other spot on coccyx have healed over-  PEG site- burning skin around PEG- due to gastric contents- covered with badage C/D/I Neurological:     Mental Status: She is alert.     Comments: Alert and oriented HOH Motor: B/l UE: 4+/5 proximal to distal RLE: 4/5 proximal to distal LLE: 4/5 HF, KE, 3-/5 ADF      Assessment/Plan: 1. Functional deficits which require 3+ hours per day of interdisciplinary therapy in a comprehensive inpatient rehab setting.  Physiatrist is providing close team supervision and 24 hour management of active medical problems listed below.  Physiatrist and rehab team continue to assess barriers to discharge/monitor patient progress toward functional and medical goals  Care Tool:  Bathing    Body parts bathed by patient: Right arm,Left arm,Chest,Abdomen,Front perineal area,Buttocks,Right upper leg,Left upper leg,Face   Body parts bathed by helper: Right lower leg,Left lower leg     Bathing assist Assist Level: Minimal Assistance - Patient > 75%     Upper Body Dressing/Undressing Upper body dressing   What is the patient wearing?: Pull over shirt    Upper body assist Assist Level: Minimal Assistance - Patient > 75%    Lower Body Dressing/Undressing Lower body dressing      What is the patient wearing?: Incontinence brief,Pants     Lower body assist Assist for lower body dressing: Maximal Assistance - Patient 25 - 49%     Toileting Toileting    Toileting assist Assist for toileting: Moderate Assistance - Patient 50 - 74% Assistive Device  Comment: incontinent brief   Transfers Chair/bed transfer  Transfers assist  Chair/bed transfer activity did not occur: Safety/medical concerns  Chair/bed transfer assist level: Moderate Assistance - Patient 50 - 74%     Locomotion Ambulation   Ambulation assist   Ambulation activity did not occur: Safety/medical concerns          Walk 10 feet activity   Assist  Walk 10 feet activity did not occur: Safety/medical concerns        Walk 50 feet activity   Assist Walk 50 feet with 2 turns activity did not occur: Safety/medical concerns         Walk 150 feet activity   Assist Walk 150 feet activity did not occur: Safety/medical concerns         Walk 10 feet on uneven surface   activity   Assist Walk 10 feet on uneven surfaces activity did not occur: Safety/medical concerns         Wheelchair     Assist Will patient use wheelchair at discharge?: Yes (pt will likely require WC at DC however pt unable to tolerate WC transfer or assessment on this date) Type of Wheelchair: Manual Wheelchair activity did not occur: Safety/medical concerns         Wheelchair 50 feet with 2 turns activity    Assist    Wheelchair 50 feet with 2 turns activity did not occur: Safety/medical concerns       Wheelchair 150 feet activity     Assist  Wheelchair 150 feet activity did not occur: Safety/medical concerns       Blood pressure (!) 88/52, pulse 90, temperature 98.3 F (36.8 C), resp. rate 20, height 5\' 4"  (1.626 m), weight 36.3 kg, SpO2 95 %.  Medical Problem List and Plan: 1.  Debility secondary to abdominal wall cellulitis/sepsis complicated by chronic hypoxic respiratory failure/RA             -patient may not shower             -ELOS/Goals: 14-17 days/Min A             will change to 1x/day of PT and OT- and look for SNF bed- since so low function per pt and PT/OT  -con't PT and OT- pt asking when leaving- explained don't have bed yet, but will con't to work on her medical issues while here.  2.  Impaired mobility -DVT/anticoagulation: Continue Lovenox             -antiplatelet therapy: N/A 3. Pain Management: Tylenol as needed  4/29- tried tramadol - didn't work- and try Norco 5/325 mg q4 hours prn  4/30: having 10/10 pain with dressing change, continue Norco and give 30 minutes prior to dressing change, Caryl Pina will pass onto oncoming nurse  5/1: AST elevated and pain prohibiting participation with therapy. Switch to oxycodone for better pain control and less tylenol.   5/2- AST is 42- very slightly elevated, likely from frequent tylenol usage- will ask nursing to use Oxy and not tylenol for pain.   5/3- changed Tylenol order only for fever-  con't oxy which pt feels is real helpful for gum pain.   5/4- reinforced to nursing- con't regimen  5/5- getting pain meds around the clock- con't regimen working slightly better  5/6- will try Adding Gabapentin 400 mg QHS and see how does with pain.   5/7- c/o pain, but appeared less uncomfortable/less crying out this AM- con't regimen 4. Mood: Remeron 15 mg  nightly  5/3- will change to 30 mg QHS- is better dose for appetite             -antipsychotic agents: N/A 5. Neuropsych: This patient is capable of making decisions on her own behalf. 6. Skin/Wound Care: Routine skin checks.  Foam dressing to umbilicus and change every 3 days as needed soiling.  Apply saline moistened 2 x 2 to abdomen wound daily cover with dry 2 x 2 and tape.  Apply dry split thickness gauze underneath G-tube daily.  4/30: reconsulted wound care as PEG site with some leaking TF  5/2- said it's due to fistula- won't follow, but remain available. Also has 3- Stage II small spots on coccyx x2 and 1 on L inner buttock- con't wound care/foam  5/4- will put on air mattress since not moving and has 3 Stage II's-  5/5- spoke to nurse- they will get pt air mattress due to BMI 15- extrmely cachetic and has pressure ulcers x3 Stage II on backside.    5/7- has a tiny stage III and small stage II on L inner buttock- con't air mattress and wound care 7. Fluids/Electrolytes/Nutrition: Routine in and outs.             CMP ordered 8.  Rheumatoid arthritis.  Continue prednisone. 9.  Chronic hypoxic respiratory failure.  Oxygen therapy as directed continue chronic prednisone.             Monitor O2 sats with increased exertion 10.  Dysphagia.  Dysphagia #1 thin liquids.  G-tube replaced 10/29/2020 per interventional radiology.  Dietary follow-up             Advance diet as tolerated  4/29- PEG- fell out- got back in by radiology-but has high risk of coming out- came out with balloon intact- con't to monitor   5/5- gastric contents burning  skin- WOC consulted- will change orders.  11.  Thrush/mouth pain/odynophagia.  Follow Dr. Benson Norway  4/29- try Norco/Oxycodone- 5/325 mg Q4 hours prn- thrush appears to be improved. If doesn't improve, will change to Oxycodone- has no bad reactions to pain meds in past.   5/2- con't oxy prn- don't use tylenol  5/3- oxy making a big difference- con't oxy- changed tylenol to be only for fever 12. Severe malnutrition  4/29- con't PEG; TFS nightly- BMI 13.6- con't to push PO- but limited by gum pain.   5/2- BMI up to 13.93-   5/3- BMI up to 14.72 today? Not sure if true or not, appears the same- con't regimen  5/4- BMI up to 15  5/5- BMI 15.02 from 15.1 yesterday- eating 25% of meals yesterday- is better- gum pain and appetite slightly improved.  5/6- is eating more- BMI said 12.53 this AM- this makes no sense- she didn't drop 6 kg in 1 day?  5/7- BMI 13.53 today- will increase Marinol to 2.5 mg BID and see how she does 13. Hypotension-   4/29- BP running 80s/40s-will try Low dose Midodrine- 2.5 mg TID with meals- for lower BP.   5/3- BP 90s/50s- better- no dizziness- con't regimen  5/4 will increase to 5 mg TID with meals- since BP 80/s40s again  5/6- BP much better 117/60s- con't regimen  5/7- maybe not drinking as much- BP 88/52 today- will push fluids- also TFs weren't run full length last night- con't regimen 14. Loose stools: likely 2/2 TF, continue to monitor  5/7- now constipated- LBM 5/3- will add Senokot 1 tab BID and monitor 15.  Iron deficiency: started iron supplement      LOS: 10 days A FACE TO FACE EVALUATION WAS PERFORMED  Quantavius Humm 11/14/2020, 2:48 PM

## 2020-11-14 NOTE — Progress Notes (Signed)
Occupational Therapy Session Note  Patient Details  Name: [name] [name] MRN: [id number] Date of Birth: 1931/02/12  Today's Date: 11/14/2020 OT Individual Time: 0093-8182 OT Individual Time Calculation (min): 29 min  31 minutes missed  Short Term Goals: Week 2:  OT Short Term Goal 1 (Week 2): STGs = LTGs d/t ELOS   Skilled Therapeutic Interventions/Progress Updates:    Pt greeted in bed, just received her oxy from RN due to abdominal pain from PEG site. Pt refusing to participate in therapy due to pain/fatigue. Her POA [name] was present, assisted OT with providing encouragement to pt however our combined efforts were unsuccessful. Openly discussed pts CLOF and how limiting her ongoing pain was in regards to therapy participation. [name] verbalized understanding. Pts 02 was off, sats 95% on RA and per RN ok to keep nasal canula off. +2 for boosting pt up in bed and for placing her in sidelying position for pressure relief. OT provided her with lavender aromatherapy to therapeutically address pain, also played relaxing beach music on her room computer. Teds donned for DVT prevention, pt requiring Total A for donning footwear. Pt remained in bed at close of session, all needs within reach, 4 bedrails up for air mattress safety. Time missed due to pain/refusal.   Therapy Documentation Precautions:  Precautions Precautions: Fall Precaution Comments: abdominal binder to protect PEG site, oral thrush/mucositis and mouth ulceration, 2L 02 Restrictions Weight Bearing Restrictions: No Vital Signs: Therapy Vitals Temp: 98.3 F (36.8 C) Pulse Rate: 90 Resp: 20 BP: (!) 88/52 Patient Position (if appropriate): Lying Oxygen Therapy SpO2: 95 % O2 Device: Room Air Pain: Pain Assessment Pain Scale: 0-10 Pain Score: 8  Pain Type: Acute pain Pain Location: Abdomen Pain Orientation: Mid Pain Descriptors / Indicators: Aching;Discomfort Pain Frequency: Other (Comment) Pain Onset: On-going Pain  Intervention(s): Medication (See eMAR) ADL: ADL Eating: Not assessed Grooming: Setup Where Assessed-Grooming: Edge of bed Upper Body Bathing: Setup Where Assessed-Upper Body Bathing: Edge of bed Lower Body Bathing: Minimal assistance Where Assessed-Lower Body Bathing: Other (Comment) (BSC) Upper Body Dressing: Minimal assistance Where Assessed-Upper Body Dressing: Other (Comment) (sitting on BSC) Lower Body Dressing: Dependent Where Assessed-Lower Body Dressing: Other (Comment) (sit<stand from [facility]) Toileting: Moderate assistance Where Assessed-Toileting: Bedside Commode Toilet Transfer: Moderate assistance Toilet Transfer Method: Stand pivot (without AD) Toilet Transfer Equipment: Bedside commode Tub/Shower Transfer: Not assessed      Therapy/Group: Individual Therapy  [name] A [name] 11/14/2020, 12:11 PM

## 2020-11-14 NOTE — Progress Notes (Signed)
Speech Language Pathology Daily Session Note  Patient Details  Name: [name] [name] MRN: [id number] Date of Birth: [birth year]  Today's Date: 11/14/2020 SLP Individual Time: 0930-1005 SLP Individual Time Calculation (min): 35 min  Short Term Goals: Week 1: SLP Short Term Goal 1 (Week 1): Patient will demonstrated toleration of current diet Dys 1 puree solids and thin liquids without overt s/s aspiration or penetration. SLP Short Term Goal 1 - Progress (Week 1): Met SLP Short Term Goal 2 (Week 1): Patient will tolerate trials of upgraded solids with adequate mastication and oral phase of swallow with supervision A. SLP Short Term Goal 2 - Progress (Week 1): Met SLP Short Term Goal 3 (Week 1): Patient will participate in cognitive-linguistic and speech evaluation to determine need for development of tx goals. SLP Short Term Goal 3 - Progress (Week 1): Progressing toward goal  Skilled Therapeutic Interventions:   Patient seen for skilled ST session focusing on swallow function. Upon SLP entering room, patient was alert, sitting up in bed and eating her breakfast. She has reportedly been eating more lately and she herself states that her appetite has improved. Mild delay in mastication of solids and one mild throat clear but otherwise, patient appears to be tolerating current diet without significant difficulty. SLP plans to see patient one more time to ensure diet toleration and no cognitive tx needs but anticipate discharge early next week.  Pain Pain Assessment Pain Scale: 0-10 Pain Score: 8  Faces Pain Scale: No hurt Pain Type: Acute pain Pain Location: Abdomen Pain Orientation: Mid Pain Descriptors / Indicators: Aching;Discomfort Pain Frequency: Other (Comment) Pain Onset: On-going Pain Intervention(s): Medication (See eMAR)  Therapy/Group: Individual Therapy  Sonia Baller, MA, CCC-SLP Speech Therapy

## 2020-11-14 NOTE — Plan of Care (Signed)
  Problem: RH BOWEL ELIMINATION Goal: RH STG MANAGE BOWEL WITH ASSISTANCE Description: STG Manage Bowel with Min Assistance. Outcome: Not Progressing; constipation LBM 5/3. MD made aware   Problem: RH BLADDER ELIMINATION Goal: RH STG MANAGE BLADDER WITH ASSISTANCE Description: STG Manage Bladder With Min Assistance Outcome: Not Progressing; incontinece

## 2020-11-15 LAB — GLUCOSE, CAPILLARY
Glucose-Capillary: 104 mg/dL — ABNORMAL HIGH (ref 70–99)
Glucose-Capillary: 111 mg/dL — ABNORMAL HIGH (ref 70–99)
Glucose-Capillary: 120 mg/dL — ABNORMAL HIGH (ref 70–99)
Glucose-Capillary: 125 mg/dL — ABNORMAL HIGH (ref 70–99)
Glucose-Capillary: 129 mg/dL — ABNORMAL HIGH (ref 70–99)
Glucose-Capillary: 154 mg/dL — ABNORMAL HIGH (ref 70–99)

## 2020-11-15 MED ORDER — OSMOLITE 1.5 CAL PO LIQD: 840.0000 mL | ORAL | Status: DC

## 2020-11-15 MED ORDER — GABAPENTIN 300 MG PO CAPS
300.0000 mg | ORAL_CAPSULE | Freq: Three times a day (TID) | ORAL | Status: DC
  Administered 2020-11-15 – 2020-11-23 (×24): 300 mg via ORAL
  Filled 2020-11-15 (×24): qty 1

## 2020-11-15 MED ORDER — POLYETHYLENE GLYCOL 3350 17 G PO PACK
17.0000 g | PACK | Freq: Every day | ORAL | Status: DC
  Administered 2020-11-15 – 2020-11-23 (×8): 17 g via ORAL
  Filled 2020-11-15 (×9): qty 1

## 2020-11-15 MED ORDER — SORBITOL 70 % SOLN
30.0000 mL | Freq: Once | Status: AC
  Administered 2020-11-15: 30 mL via ORAL
  Filled 2020-11-15: qty 30

## 2020-11-15 MED ORDER — INSULIN ASPART 100 UNIT/ML ~~LOC~~ SOLN: 0.0000 [IU] | Freq: Four times a day (QID) | SUBCUTANEOUS | Status: DC

## 2020-11-15 MED ORDER — INSULIN ASPART 100 UNIT/ML ~~LOC~~ SOLN
0.0000 [IU] | Freq: Three times a day (TID) | SUBCUTANEOUS | Status: DC
  Administered 2020-11-17: 1 [IU] via SUBCUTANEOUS
  Administered 2020-11-18: 2 [IU] via SUBCUTANEOUS
  Administered 2020-11-21 – 2020-11-23 (×2): 1 [IU] via SUBCUTANEOUS

## 2020-11-15 NOTE — Progress Notes (Signed)
PROGRESS NOTE   Subjective/Complaints:  Pt reports no BM yet- gums and abd wounds still hurt, but admits hasn't asked for pain meds since last evening.   Will add Miralax daily to bowel meds and give 1 dose Sorbitol.  BMI 13.22 today. Some could be constipation!    ROS:  Pt denies SOB,, CP, N/V/D, and vision changes  Objective:   No results found. No results for input(s): WBC, HGB, HCT, PLT in the last 72 hours. No results for input(s): NA, K, CL, CO2, GLUCOSE, BUN, CREATININE, CALCIUM in the last 72 hours.  Intake/Output Summary (Last 24 hours) at 11/15/2020 1013 Last data filed at 11/14/2020 1854 Gross per 24 hour  Intake 120 ml  Output --  Net 120 ml       Physical Exam: Vital Signs Blood pressure (!) 106/52, pulse 89, temperature 98.1 F (36.7 C), temperature source Oral, resp. rate 18, height 5\' 4"  (1.626 m), weight 34.9 kg, SpO2 95 %.    General: awake, alert, appropriate, supine in air mattress/bed; frail appearing; NAD HENT: conjugate gaze; oropharynx moist CV: regular rate; no JVD Pulmonary: CTA B/L; no W/R/R- good air movement GI: soft, NT, ND, (+)BS; hypoactive;  abd wound C/D/I; (+)PEG in place Psychiatric: appropriate- depressed affect- more notable today Neurological: alert Musculoskeletal:     Cervical back: Normal range of motion and neck supple.     Comments: No edema or tenderness in extremities  Skin: Has a stage 3- tiny- less than pencil diameter, it's so small- qtip wouldn't fit easily- on coccyx -also has a barely open- very superficial spot on L inner buttock The R lumbar and other spot on coccyx have healed over-  PEG site- burning skin around PEG- due to gastric contents- covered with badage C/D/I Neurological:     Mental Status: She is alert.     Comments: Alert and oriented HOH Motor: B/l UE: 4+/5 proximal to distal RLE: 4/5 proximal to distal LLE: 4/5 HF, KE, 3-/5 ADF      Assessment/Plan: 1. Functional deficits which require 3+ hours per day of interdisciplinary therapy in a comprehensive inpatient rehab setting.  Physiatrist is providing close team supervision and 24 hour management of active medical problems listed below.  Physiatrist and rehab team continue to assess barriers to discharge/monitor patient progress toward functional and medical goals  Care Tool:  Bathing    Body parts bathed by patient: Right arm,Left arm,Chest,Abdomen,Front perineal area,Buttocks,Right upper leg,Left upper leg,Face   Body parts bathed by helper: Right lower leg,Left lower leg     Bathing assist Assist Level: Minimal Assistance - Patient > 75%     Upper Body Dressing/Undressing Upper body dressing   What is the patient wearing?: Pull over shirt    Upper body assist Assist Level: Minimal Assistance - Patient > 75%    Lower Body Dressing/Undressing Lower body dressing      What is the patient wearing?: Incontinence brief,Pants     Lower body assist Assist for lower body dressing: Maximal Assistance - Patient 25 - 49%     Toileting Toileting    Toileting assist Assist for toileting: Moderate Assistance - Patient 50 - 74% Assistive Device  Comment: incontinent brief   Transfers Chair/bed transfer  Transfers assist  Chair/bed transfer activity did not occur: Safety/medical concerns  Chair/bed transfer assist level: Moderate Assistance - Patient 50 - 74%     Locomotion Ambulation   Ambulation assist   Ambulation activity did not occur: Safety/medical concerns          Walk 10 feet activity   Assist  Walk 10 feet activity did not occur: Safety/medical concerns        Walk 50 feet activity   Assist Walk 50 feet with 2 turns activity did not occur: Safety/medical concerns         Walk 150 feet activity   Assist Walk 150 feet activity did not occur: Safety/medical concerns         Walk 10 feet on uneven surface   activity   Assist Walk 10 feet on uneven surfaces activity did not occur: Safety/medical concerns         Wheelchair     Assist Will patient use wheelchair at discharge?: Yes (pt will likely require WC at DC however pt unable to tolerate WC transfer or assessment on this date) Type of Wheelchair: Manual Wheelchair activity did not occur: Safety/medical concerns         Wheelchair 50 feet with 2 turns activity    Assist    Wheelchair 50 feet with 2 turns activity did not occur: Safety/medical concerns       Wheelchair 150 feet activity     Assist  Wheelchair 150 feet activity did not occur: Safety/medical concerns       Blood pressure (!) 106/52, pulse 89, temperature 98.1 F (36.7 C), temperature source Oral, resp. rate 18, height 5\' 4"  (1.626 m), weight 34.9 kg, SpO2 95 %.  Medical Problem List and Plan: 1.  Debility secondary to abdominal wall cellulitis/sepsis complicated by chronic hypoxic respiratory failure/RA             -patient may not shower             -ELOS/Goals: 14-17 days/Min A             will change to 1x/day of PT and OT- and look for SNF bed- since so low function per pt and PT/OT  -con't PT and OT- con't medical treatment while here 2.  Impaired mobility -DVT/anticoagulation: Continue Lovenox             -antiplatelet therapy: N/A 3. Pain Management: Tylenol as needed  4/29- tried tramadol - didn't work- and try Norco 5/325 mg q4 hours prn  4/30: having 10/10 pain with dressing change, continue Norco and give 30 minutes prior to dressing change, Caryl Pina will pass onto oncoming nurse  5/1: AST elevated and pain prohibiting participation with therapy. Switch to oxycodone for better pain control and less tylenol.   5/2- AST is 42- very slightly elevated, likely from frequent tylenol usage- will ask nursing to use Oxy and not tylenol for pain.   5/3- changed Tylenol order only for fever- con't oxy which pt feels is real helpful for gum  pain.   5/4- reinforced to nursing- con't regimen  5/5- getting pain meds around the clock- con't regimen working slightly better  5/6- will try Adding Gabapentin 400 mg QHS and see how does with pain.   5/7- c/o pain, but appeared less uncomfortable/less crying out this AM- con't regimen  5/8- will increase Gabapentin to 300 mg TID and monitor-c on't rest of regimen 4. Mood: Remeron  15 mg nightly  5/3- will change to 30 mg QHS- is better dose for appetite             -antipsychotic agents: N/A 5. Neuropsych: This patient is capable of making decisions on her own behalf. 6. Skin/Wound Care: Routine skin checks.  Foam dressing to umbilicus and change every 3 days as needed soiling.  Apply saline moistened 2 x 2 to abdomen wound daily cover with dry 2 x 2 and tape.  Apply dry split thickness gauze underneath G-tube daily.  4/30: reconsulted wound care as PEG site with some leaking TF  5/2- said it's due to fistula- won't follow, but remain available. Also has 3- Stage II small spots on coccyx x2 and 1 on L inner buttock- con't wound care/foam  5/4- will put on air mattress since not moving and has 3 Stage II's-  5/5- spoke to nurse- they will get pt air mattress due to BMI 13- extrmely cachetic and has pressure ulcers x3 Stage II on backside.    5/7- has a tiny stage III and small stage II on L inner buttock- con't air mattress and wound care 7. Fluids/Electrolytes/Nutrition: Routine in and outs.             CMP ordered 8.  Rheumatoid arthritis.  Continue prednisone. 9.  Chronic hypoxic respiratory failure.  Oxygen therapy as directed continue chronic prednisone.             Monitor O2 sats with increased exertion 10.  Dysphagia.  Dysphagia #1 thin liquids.  G-tube replaced 10/29/2020 per interventional radiology.  Dietary follow-up             Advance diet as tolerated  4/29- PEG- fell out- got back in by radiology-but has high risk of coming out- came out with balloon intact- con't to monitor    5/5- gastric contents burning skin- WOC consulted- will change orders.  5/8- pt says she's eating, but no improvement in weight- con't to encourage  11.  Thrush/mouth pain/odynophagia.  Follow Dr. Benson Norway  4/29- try Norco/Oxycodone- 5/325 mg Q4 hours prn- thrush appears to be improved. If doesn't improve, will change to Oxycodone- has no bad reactions to pain meds in past.   5/2- con't oxy prn- don't use tylenol  5/3- oxy making a big difference- con't oxy- changed tylenol to be only for fever  5/8- hasn't had pain meds since last evening- added gabapentin 300 mg TID and monitor 12. Severe malnutrition  4/29- con't PEG; TFS nightly- BMI 13.6- con't to push PO- but limited by gum pain.   5/2- BMI up to 13.93-   5/3- BMI up to 14.72 today? Not sure if true or not, appears the same- con't regimen  5/4- BMI up to 15  5/5- BMI 15.02 from 15.1 yesterday- eating 25% of meals yesterday- is better- gum pain and appetite slightly improved.  5/6- is eating more- BMI said 12.53 this AM- this makes no sense- she didn't drop 6 kg in 1 day?  5/7- BMI 13.53 today- will increase Marinol to 2.5 mg BID and see how she does  5/8- BMI 13.22- could also be constipation 13. Hypotension-   4/29- BP running 80s/40s-will try Low dose Midodrine- 2.5 mg TID with meals- for lower BP.   5/3- BP 90s/50s- better- no dizziness- con't regimen  5/4 will increase to 5 mg TID with meals- since BP 80/s40s again  5/6- BP much better 117/60s- con't regimen  5/7- maybe not drinking as much-  BP 88/52 today- will push fluids- also TFs weren't run full length last night- con't regimen 14. Loose stools: likely 2/2 TF, continue to monitor  5/7- now constipated- LBM 5/3- will add Senokot 1 tab BID and monitor  5/8- add Miralax daily and gave 1 dose Sorbitol- if no improvement, needs KUB tomorrow 15. Iron deficiency: started iron supplement      LOS: 11 days A FACE TO FACE EVALUATION WAS PERFORMED  Faruq Rosenberger 11/15/2020,  10:13 AM

## 2020-11-15 NOTE — Plan of Care (Signed)
  Problem: RH BOWEL ELIMINATION Goal: RH STG MANAGE BOWEL WITH ASSISTANCE Description: STG Manage Bowel with Min Assistance. Outcome: Not Progressing; constipation LBM 5/3; new orders noted   Problem: RH BLADDER ELIMINATION Goal: RH STG MANAGE BLADDER WITH ASSISTANCE Description: STG Manage Bladder With Min Assistance Outcome: Not Progressing; incontinence

## 2020-11-15 NOTE — Progress Notes (Signed)
Peg tube noted to be about two inches out there is feed present on old dressing and a moderate amount of feed from peg site. Dr. Dagoberto Ligas called orders received.

## 2020-11-15 NOTE — Progress Notes (Signed)
Occupational Therapy Session Note  Patient Details  Name: [name] [name] MRN: [id number] Date of Birth: Jul 14, 1930  Today's Date: 11/15/2020 OT Individual Time: 9030-0923 OT Individual Time Calculation (min): 40 min    Short Term Goals: Week 2:  OT Short Term Goal 1 (Week 2): STGs = LTGs d/t ELOS  Skilled Therapeutic Interventions/Progress Updates:    Treatment session with focus on functional mobility, sit > stand, and dynamic standing during self-care tasks.  Pt received semi-reclined in bed with visitors present and preparing to leave.  Pt with minimal food intake, therefore therapist encouraging pt to partake in any aspect of lunch or items brought by visitors.  Pt agreeable to sitting up to EOB for therapy session.  During mobility, pt reports need to toilet.  Therapist set up [facility] next to bed.  Pt completed bed mobility with min assist and stand pivot transfer to [facility] with min assist.  Therapist applied abdominal binder prior to transfer.  Pt reports discomfort in abdomen, frequently adjusting binder.  Max- total assist for toileting as therapist completing hygiene and donning clean brief while pt maintained standing with Min assist.  Pt reports fatigue post toileting, requesting to return to supine. Engaged in lateral scoots along EOB with CGA in preparation for returning to supine. Pt remained semi-reclined in bed with binder removed and all needs in reach.  Therapy Documentation Precautions:  Precautions Precautions: Fall Precaution Comments: abdominal binder to protect PEG site, oral thrush/mucositis and mouth ulceration, 2L 02 Restrictions Weight Bearing Restrictions: No General:   Vital Signs: Therapy Vitals Temp: 98.5 F (36.9 C) Temp Source: Oral Pulse Rate: 89 Resp: 19 BP: (!) 86/61 Patient Position (if appropriate): Lying Oxygen Therapy SpO2: 96 % O2 Device: Room Air Pain:  Pt with c/o pain on abdomen. RN notified.   Therapy/Group: Individual Therapy  Simonne Come 11/15/2020, 3:20 PM

## 2020-11-16 ENCOUNTER — Inpatient Hospital Stay (HOSPITAL_COMMUNITY): Payer: Medicare Other

## 2020-11-16 LAB — GLUCOSE, CAPILLARY
Glucose-Capillary: 89 mg/dL (ref 70–99)
Glucose-Capillary: 109 mg/dL — ABNORMAL HIGH (ref 70–99)
Glucose-Capillary: 88 mg/dL (ref 70–99)
Glucose-Capillary: 89 mg/dL (ref 70–99)

## 2020-11-16 MED ORDER — MYCOPHENOLATE MOFETIL 250 MG PO CAPS
1000.0000 mg | ORAL_CAPSULE | Freq: Two times a day (BID) | ORAL | Status: DC
  Administered 2020-11-16 – 2020-11-23 (×14): 1000 mg via ORAL
  Filled 2020-11-16 (×14): qty 4

## 2020-11-16 MED ORDER — DEXTROSE-NACL 5-0.45 % IV SOLN
INTRAVENOUS | Status: DC
  Filled 2020-11-16: qty 1000

## 2020-11-16 MED ORDER — OSMOLITE 1.5 CAL PO LIQD
840.0000 mL | ORAL | Status: DC
  Administered 2020-11-17 – 2020-11-22 (×6): 840 mL
  Filled 2020-11-16 (×5): qty 948

## 2020-11-16 NOTE — Progress Notes (Incomplete)
Make aware of MEWS by assigned NurseChristena Deem) , patient assessed , alert and oriented, B/P and Pulse assessed and recorded manually

## 2020-11-16 NOTE — Progress Notes (Signed)
Speech Language Pathology Discharge Summary  Patient Details  Name: [name] [name] MRN: [id number] Date of Birth: [birth year]  Today's Date: 11/16/2020 SLP Individual Time: 1100-1130 SLP Individual Time Calculation (min): 30 min   Skilled Therapeutic Interventions:   Skilled ST services focused education skills. SLP facilitated recall of swallow strategies and current diet selecting soft food due to poor dentition mod I. SLP attempted to formally assess cognitive skills however limited by pain. Pt demonstrated mild deficits in memory recalling 1 out 4 words and 3 out 4 with cues. Verbal problem solving pertaining to medication management, dosages x2-x3 a day were accurate and judgement/safety question responses were [facility]. Pt supports having hired help to assist with higher level task such as medication/money/time management. SLP provided education pertaining to swallowing and cognitive function, possibly impacted by pain. All questions answered to satisfaction. SLP recommends d/c, no further ST services while in CIR, but follow up [place] is recommended to continue higher level cognitive assessment once pain is better managed.Pt was left in room with call bell within reach and bed alarm set.    Patient has met 5 of 5 long term goals.  Patient to discharge at overall Supervision;Modified Independent level.  Reasons goals not met:     Clinical Impression/Discharge Summary:   Pt made great progress meeting 5 out 5 goals. Pt is currently consuming regular textures and thin liquids, with precautions to select soft foods due to poor dentition. Cognitive skills were informally assessed but limited by pain. Pt appeared to demonstrate functional short term recall, awareness, basic-mildly complex problem solving and attention. Primary OT/PT both support no functional cognitive deficits noted in treatment sessions. Pt will have hired help to assist with higher level cognitive task once d/c for CIR and SLP recommends  follow up [place] to continue to assess cognitive skills once pain is better managed. Pt benefited from skilled ST services in order to maximize functional independence and reduce burden of care.   Care Partner:  Caregiver Able to Provide Assistance: Yes  Type of Caregiver Assistance: Physical;Cognitive  Recommendation:  Home Health SLP (HHST to follow up higher leve, limited in CIR due to pain)  Rationale for SLP Follow Up: Reduce caregiver burden;Maximize cognitive function and independence   Equipment: N/A   Reasons for discharge: Treatment goals met   Patient/Family Agrees with Progress Made and Goals Achieved: Yes    [name]  [facility] 11/16/2020, 12:44 PM

## 2020-11-16 NOTE — Progress Notes (Signed)
Physical Therapy Session Note  Patient Details  Name: [name] [name] MRN: [id number] Date of Birth: [birth year]  Today's Date: 11/16/2020 PT Individual Time: 5284-1324 PT Individual Time Calculation (min): 11 min   Short Term Goals: Week 1:  PT Short Term Goal 1 (Week 1): pt to demonstrated supine<>sit min A PT Short Term Goal 1 - Progress (Week 1): Not met PT Short Term Goal 2 (Week 1): pt to demosntrated functional transfer min A with LRAD PT Short Term Goal 2 - Progress (Week 1): Not met PT Short Term Goal 3 (Week 1): pt to demonstrate gait 25' min A with LRAD PT Short Term Goal 3 - Progress (Week 1): Not met Week 2:  PT Short Term Goal 1 (Week 2): pt to demonstrate supine<>sit min A PT Short Term Goal 2 (Week 2): pt to demonstrate functional transfers min A consistently PT Short Term Goal 3 (Week 2): pt to initiate gait  Skilled Therapeutic Interventions/Progress Updates:    pt received in bed and reported she did not feel well, pt reported she was upset she did not think she was "getting better medically or physically." Pt educated she had decreased tolerance to activity in generally and global weakness which she verbalized understanding and agreeing with, but reported "I just don't think I can do all this therapy." Pt reported she felt motivated but did not feel well and could not get up this afternoon. Pt directed in one roll to R for improved pillow positioning at min A and max A for upright scooting for improved positioning and comfort in bed. Pt left in bed, All needs in reach and in good condition. Call light in hand.    Therapy Documentation Precautions:  Precautions Precautions: Fall Precaution Comments: abdominal binder to protect PEG site, oral thrush/mucositis and mouth ulceration, 2L 02 Restrictions Weight Bearing Restrictions: No General: PT Amount of Missed Time (min): 34 Minutes PT Missed Treatment Reason: Patient fatigue;Patient unwilling to participate;Pain Vital  Signs: Therapy Vitals Temp: 98.1 F (36.7 C) Pulse Rate: 80 Resp: 18 BP: (!) 88/57 Patient Position (if appropriate): Lying Oxygen Therapy SpO2: 95 % O2 Device: Room Air Pain: Pain Assessment Pain Scale: 0-10 (dressing change) Pain Score: 8  Faces Pain Scale: Hurts even more Pain Type: Acute pain Pain Location: Abdomen Pain Descriptors / Indicators: Aching Pain Onset: On-going Pain Intervention(s): Relaxation;Rest Mobility:   Locomotion :    Trunk/Postural Assessment :    Balance:   Exercises:   Other Treatments:      Therapy/Group: Individual Therapy  Junie Panning 11/16/2020, 2:06 PM

## 2020-11-16 NOTE — Progress Notes (Signed)
Nutrition Follow-up  DOCUMENTATION CODES:   Underweight,Severe malnutrition in context of chronic illness  INTERVENTION:  Once G-tube is replaced: Continue nocturnal tube feeds: - Osmolite 1.5 @ 70 ml/hr to run over 12 hours (1800 to 0600) - ProSource TF 45 ml daily per tube  Nocturnal tube feeding regimen provides 1300 kcal, 64 grams of protein, and 640 ml of H2O (meets 87% of kcal needs and 91% of protein needs).  - continue Boost Breeze po BID with breakfast and lunch meals, each supplement provides 250 kcal and 9 grams of protein  - Continue Ensure Enlive po daily with dinner meal, each supplement provides 350 kcal and 20 grams of protein  - Continue MVI with minerals daily  NUTRITION DIAGNOSIS:   Severe Malnutrition related to chronic illness as evidenced by severe fat depletion,severe muscle depletion,percent weight loss (23.7% weight loss in less than 4 months); ongoing  GOAL:   Patient will meet greater than or equal to 90% of their needs; progressing  MONITOR:   PO intake,Supplement acceptance,Weight trends,TF tolerance,Skin  REASON FOR ASSESSMENT:   Malnutrition Screening Tool,Consult Enteral/tube feeding initiation and management  ASSESSMENT:   87 year old female with PMH of chronic hypoxic respiratory failure, interstitial pneumonia, pulmonary fibrosis on 2 L oxygen, rheumatoid arthritis, DVT/PE, SBO s/p ex-lap in February 2022 with G-tube placement 08/23/20 that has since been removed. Pt presented on 10/23/20 with increasing abdominal pain and some wound drainage from prior G-tube, AMS, right foot drop. Due to patient's poor PO intake and ongoing significant dysphagia, IR consulted and G-tube was placed 10/29/20. Admitted to CIR on 4/27.  4/29 - G-tube replaced 5/8- G-tube came out overnight  RN replaced G-tube with foley. Plans for IR to replace G-tube. Per IR, pt very cachectic which causes balloon retention  G-tube to easily become dislodged, if G-tube  continues to be dislodged likely plans to proceed with replacement of pull through G-tube.   Pt eating lunch during time of visit and reports chewing difficulties due to denture problems. Meal completion poor at 20%. Plans to continue tube feeding orders to aid in caloric and protein needs. Noted, TF discontinued after Gtube came out yesterday. RD to reorder.   Labs and medications reviewed.   Diet Order:   Diet Order            Diet regular Room service appropriate? Yes; Fluid consistency: Thin  Diet effective now                 EDUCATION NEEDS:   No education needs have been identified at this time  Skin:  Skin Assessment Skin Integrity Issues: Stage I: lumbar Stage II: L buttocks Stage III: sacrum Incisions: abdomen (old G-tube site)  Last BM:  5/8  Height:   Ht Readings from Last 1 Encounters:  11/16/20 5\' 4"  (1.626 m)    Weight:   Wt Readings from Last 1 Encounters:  11/16/20 35 kg    Ideal Body Weight:  54.5 kg  BMI:  Body mass index is 13.24 kg/m.  Estimated Nutritional Needs:   Kcal:  1500-1700  Protein:  70-85 grams  Fluid:  1.5 L/day  Corrin Parker, MS, RD, LDN RD pager number/after hours weekend pager number on Amion.

## 2020-11-16 NOTE — Progress Notes (Signed)
PROGRESS NOTE   Subjective/Complaints: PEG came out again last night. Discussed with [name] [name] requesting IR to replace PEG.  Foley was also replaced last night.  [name] [name] complains of oral pain; discussed with nursing use of Oragel.   ROS:  Pt denies SOB,, CP, N/V/D, and vision changes, +oral pain  Objective:   DG Abd Portable 1V  Result Date: 11/16/2020 CLINICAL DATA:  Peg tube malfunction.  Foley put in place. EXAM: PORTABLE ABDOMEN - 1 VIEW COMPARISON:  X-ray abdomen 11/05/2020 FINDINGS: Percutaneous gastrostomy tube with tip and likely inflated balloon overlying the the antral region/pylorus. Partial opacification of the gastric lumen and proximal duodenum. PO contrast noted to opacify the distal colon. The bowel gas pattern is normal. No radio-opaque calculi or other significant radiographic abnormality are seen. IMPRESSION: Percutaneous gastrostomy tube with tip in the region of the gastric antrum/pylorus Electronically Signed   By: Iven Finn M.D.   On: 11/16/2020 00:59   No results for input(s): WBC, HGB, HCT, PLT in the last 72 hours. No results for input(s): NA, K, CL, CO2, GLUCOSE, BUN, CREATININE, CALCIUM in the last 72 hours.  Intake/Output Summary (Last 24 hours) at 11/16/2020 0940 Last data filed at 11/16/2020 0700 Gross per 24 hour  Intake 690 ml  Output --  Net 690 ml       Physical Exam: Vital Signs Blood pressure 104/74, pulse 81, temperature 97.8 F (36.6 C), resp. rate 18, height 5\' 4"  (1.626 m), weight 35 kg, SpO2 97 %. Gen: no distress, normal appearing HEENT: oral mucosa pink and moist, NCAT Cardio: Reg rate Chest: normal effort, normal rate of breathing Abd: soft, non-distended Ext: no edema Psych: pleasant, normal affect Skin: intact Musculoskeletal:     Cervical back: Normal range of motion and neck supple.     Comments: No edema or tenderness in extremities  Skin: Has a stage 3- tiny- less  than pencil diameter, it's so small- qtip wouldn't fit easily- on coccyx -also has a barely open- very superficial spot on L inner buttock The R lumbar and other spot on coccyx have healed over-  PEG site- burning skin around PEG- due to gastric contents- covered with badage C/D/I Neurological:     Mental Status: She is alert.     Comments: Alert and oriented HOH Motor: B/l UE: 4+/5 proximal to distal RLE: 4/5 proximal to distal LLE: 4/5 HF, KE, 3-/5 ADF     Assessment/Plan: 1. Functional deficits which require 3+ hours per day of interdisciplinary therapy in a comprehensive inpatient rehab setting.  Physiatrist is providing close team supervision and 24 hour management of active medical problems listed below.  Physiatrist and rehab team continue to assess barriers to discharge/monitor patient progress toward functional and medical goals  Care Tool:  Bathing    Body parts bathed by patient: Right arm,Left arm,Chest,Abdomen,Front perineal area,Buttocks,Right upper leg,Left upper leg,Face   Body parts bathed by helper: Right lower leg,Left lower leg     Bathing assist Assist Level: Minimal Assistance - Patient > 75%     Upper Body Dressing/Undressing Upper body dressing   What is the patient wearing?: Pull over shirt    Upper body assist  Assist Level: Minimal Assistance - Patient > 75%    Lower Body Dressing/Undressing Lower body dressing      What is the patient wearing?: Incontinence brief,Pants     Lower body assist Assist for lower body dressing: Maximal Assistance - Patient 25 - 49%     Toileting Toileting    Toileting assist Assist for toileting: Moderate Assistance - Patient 50 - 74% Assistive Device Comment: incontinent brief   Transfers Chair/bed transfer  Transfers assist  Chair/bed transfer activity did not occur: Safety/medical concerns  Chair/bed transfer assist level: Moderate Assistance - Patient 50 - 74%      Locomotion Ambulation   Ambulation assist   Ambulation activity did not occur: Safety/medical concerns          Walk 10 feet activity   Assist  Walk 10 feet activity did not occur: Safety/medical concerns        Walk 50 feet activity   Assist Walk 50 feet with 2 turns activity did not occur: Safety/medical concerns         Walk 150 feet activity   Assist Walk 150 feet activity did not occur: Safety/medical concerns         Walk 10 feet on uneven surface  activity   Assist Walk 10 feet on uneven surfaces activity did not occur: Safety/medical concerns         Wheelchair     Assist Will patient use wheelchair at discharge?: Yes (pt will likely require WC at DC however pt unable to tolerate WC transfer or assessment on this date) Type of Wheelchair: Manual Wheelchair activity did not occur: Safety/medical concerns         Wheelchair 50 feet with 2 turns activity    Assist    Wheelchair 50 feet with 2 turns activity did not occur: Safety/medical concerns       Wheelchair 150 feet activity     Assist  Wheelchair 150 feet activity did not occur: Safety/medical concerns       Blood pressure 104/74, pulse 81, temperature 97.8 F (36.6 C), resp. rate 18, height 5\' 4"  (1.626 m), weight 35 kg, SpO2 97 %.  Medical Problem List and Plan: 1.  Debility secondary to abdominal wall cellulitis/sepsis complicated by chronic hypoxic respiratory failure/RA             -patient may not shower             -ELOS/Goals: 14-17 days/Min A             will change to 1x/day of PT and OT- and look for SNF bed- since so low function per pt and PT/OT  -Continue PT and OT- con't medical treatment while here 2.  Impaired mobility -DVT/anticoagulation: Continue Lovenox             -antiplatelet therapy: N/A 3. Pain Management: Tylenol as needed  4/29- tried tramadol - didn't work- and try Norco 5/325 mg q4 hours prn  4/30: having 10/10 pain with  dressing change, continue Norco and give 30 minutes prior to dressing change, Caryl Pina will pass onto oncoming nurse  5/1: AST elevated and pain prohibiting participation with therapy. Switch to oxycodone for better pain control and less tylenol.   5/2- AST is 42- very slightly elevated, likely from frequent tylenol usage- will ask nursing to use Oxy and not tylenol for pain.   5/3- changed Tylenol order only for fever- con't oxy which pt feels is real helpful for gum pain.  5/4- reinforced to nursing- con't regimen  5/5- getting pain meds around the clock- con't regimen working slightly better  5/6- will try Adding Gabapentin 400 mg QHS and see how does with pain.   5/7- c/o pain, but appeared less uncomfortable/less crying out this AM- con't regimen  5/8- will increase Gabapentin to 300 mg TID and monitor-c on't rest of regimen  5/9: encouraged use of oragel for mouth pain.  4. Mood: Remeron 15 mg nightly  5/3- will change to 30 mg QHS- is better dose for appetite             -antipsychotic agents: N/A 5. Neuropsych: This patient is capable of making decisions on her own behalf. 6. Skin/Wound Care: Routine skin checks.  Foam dressing to umbilicus and change every 3 days as needed soiling.  Apply saline moistened 2 x 2 to abdomen wound daily cover with dry 2 x 2 and tape.  Apply dry split thickness gauze underneath G-tube daily.  4/30: reconsulted wound care as PEG site with some leaking TF  5/2- said it's due to fistula- won't follow, but remain available. Also has 3- Stage II small spots on coccyx x2 and 1 on L inner buttock- con't wound care/foam  5/4- will put on air mattress since not moving and has 3 Stage II's-  5/5- spoke to nurse- they will get pt air mattress due to BMI 13- extrmely cachetic and has pressure ulcers x3 Stage II on backside.    5/7- has a tiny stage III and small stage II on L inner buttock- con't air mattress and wound care 7. Fluids/Electrolytes/Nutrition: Routine in  and outs.             CMP ordered 8.  Rheumatoid arthritis.  Continue prednisone. 9.  Chronic hypoxic respiratory failure.  Oxygen therapy as directed continue chronic prednisone.             Monitor O2 sats with increased exertion 10.  Dysphagia.  Dysphagia #1 thin liquids.  G-tube replaced 10/29/2020 per interventional radiology.  Dietary follow-up             Advance diet as tolerated  4/29- PEG- fell out- got back in by radiology-but has high risk of coming out- came out with balloon intact- con't to monitor   5/5- gastric contents burning skin- WOC consulted- will change orders.  5/8- pt says she's eating, but no improvement in weight- con't to encourage   5/9: PEG fell out- consulting IR regarding replacement.  11.  Thrush/mouth pain/odynophagia.  Follow Dr. Benson Norway  4/29- try Norco/Oxycodone- 5/325 mg Q4 hours prn- thrush appears to be improved. If doesn't improve, will change to Oxycodone- has no bad reactions to pain meds in past.   5/2- con't oxy prn- don't use tylenol  5/3- oxy making a big difference- con't oxy- changed tylenol to be only for fever  5/8- hasn't had pain meds since last evening- added gabapentin 300 mg TID and monitor 12. Severe malnutrition  4/29- con't PEG; TFS nightly- BMI 13.6- con't to push PO- but limited by gum pain.   5/2- BMI up to 13.93-   5/3- BMI up to 14.72 today? Not sure if true or not, appears the same- con't regimen  5/4- BMI up to 15  5/5- BMI 15.02 from 15.1 yesterday- eating 25% of meals yesterday- is better- gum pain and appetite slightly improved.  5/6- is eating more- BMI said 12.53 this AM- this makes no sense- she didn't drop 6 kg  in 1 day?  5/7- BMI 13.53 today- will increase Marinol to 2.5 mg BID and see how she does  5/8- BMI 13.22- could also be constipation 13. Hypotension-   4/29- BP running 80s/40s-will try Low dose Midodrine- 2.5 mg TID with meals- for lower BP.   5/3- BP 90s/50s- better- no dizziness- con't regimen  5/4 will  increase to 5 mg TID with meals- since BP 80/s40s again  5/6- BP much better 117/60s- con't regimen  5/7- maybe not drinking as much- BP 88/52 today- will push fluids- also TFs weren't run full length last night- con't regimen 14. Loose stools: likely 2/2 TF, continue to monitor  5/7- now constipated- LBM 5/3- will add Senokot 1 tab BID and monitor  5/8- add Miralax daily and gave 1 dose Sorbitol- if no improvement, needs KUB tomorrow 15. Iron deficiency: started iron supplement 16. Elevated CBGs: likely secondary to TF- continue to monitor      LOS: 12 days A FACE TO FACE EVALUATION WAS PERFORMED  [name] [name] 11/16/2020, 9:40 AM

## 2020-11-16 NOTE — Progress Notes (Signed)
Spoke with x-ray regarding use of contrast to check placement. Dr. Dagoberto Ligas called contrast may be given xray updated

## 2020-11-16 NOTE — Progress Notes (Signed)
Request to IR for replacement of dislodged G-tube, per primary team patient very cachectic which causes 20 Fr balloon retention g-tube to easily become dislodged despite the balloon remaining full. She currently has a 16 Fr foley in the tract.  Discussed best course of action with IR attending who recommends replacing at bedside and suturing in place, if tube continues to become dislodged despite sutures then would likely proceed with replacement of pull through gastrostomy.  Attempted to replace g-tube at bedside x 3 today, patient indicates that she would like the g-tube replaced but asks me to come back each time as she is not ready to do it right now. We will follow up with patient tomorrow to attempt bedside g-tube replacement.   Please call IR with questions or concerns.  Candiss Norse, PA-C

## 2020-11-16 NOTE — Progress Notes (Signed)
Occupational Therapy Session Note  Patient Details  Name: [name] [name] MRN: [id number] Date of Birth: 01/08/1931  Today's Date: 11/16/2020 OT Individual Time: 4158-3094 OT Individual Time Calculation (min): 25 min  and Today's Date: 11/16/2020 OT Missed Time: 20 Minutes Missed Time Reason: Patient fatigue;Pain   Short Term Goals: Week 1:  OT Short Term Goal 1 (Week 1): Pt will complete LB dressing with Mod A OT Short Term Goal 1 - Progress (Week 1): Progressing toward goal OT Short Term Goal 2 (Week 1): Pt will complete 1 grooming task while standing at the sink to increase standing endurance OT Short Term Goal 2 - Progress (Week 1): Not met OT Short Term Goal 3 (Week 1): Pt will complete sit<stand during toileting with Min A and LRAD OT Short Term Goal 3 - Progress (Week 1): Progressing toward goal Week 2:  OT Short Term Goal 1 (Week 2): STGs = LTGs d/t ELOS   Skilled Therapeutic Interventions/Progress Updates:    Pt greeted at time of session semireclined in be resting agreeable to OT session with encouragement but stating she is in significant pain at PEG site. Located RN and she is aware, going to see if pt has pain meds that can be given. Relayed to pt. Pt wanting to change clothes and states she wants to sit EOB to try, but too fatigued and in pain today. Noted brief was soiled, brief change bed level rolling L/R with CGA for therapist to perform brief change and dependent hygiene. Scooted up in bed Mod/Max A. Encouraged pt to eat more of her breakfast but declined. Call bell in reach all needs met.   Therapy Documentation Precautions:  Precautions Precautions: Fall Precaution Comments: abdominal binder to protect PEG site, oral thrush/mucositis and mouth ulceration, 2L 02 Restrictions Weight Bearing Restrictions: No     Therapy/Group: Individual Therapy  Viona Gilmore 11/16/2020, 7:14 AM

## 2020-11-16 NOTE — Progress Notes (Signed)
Peg tube removed #16 FR foley catheter placed pt tolerated well. Dressing reapplied x-ray ordered for placement.

## 2020-11-17 ENCOUNTER — Inpatient Hospital Stay (HOSPITAL_COMMUNITY): Payer: Medicare Other

## 2020-11-17 DIAGNOSIS — R1310 Dysphagia, unspecified: Secondary | ICD-10-CM

## 2020-11-17 DIAGNOSIS — E43 Unspecified severe protein-calorie malnutrition: Secondary | ICD-10-CM

## 2020-11-17 DIAGNOSIS — K9423 Gastrostomy malfunction: Secondary | ICD-10-CM

## 2020-11-17 HISTORY — PX: IR REPLC GASTRO/COLONIC TUBE PERCUT W/FLUORO: IMG2333

## 2020-11-17 LAB — GLUCOSE, CAPILLARY
Glucose-Capillary: 95 mg/dL (ref 70–99)
Glucose-Capillary: 91 mg/dL (ref 70–99)
Glucose-Capillary: 125 mg/dL — ABNORMAL HIGH (ref 70–99)
Glucose-Capillary: 162 mg/dL — ABNORMAL HIGH (ref 70–99)

## 2020-11-17 MED ORDER — OXYCODONE HCL 5 MG PO TABS
5.0000 mg | ORAL_TABLET | Freq: Every day | ORAL | Status: DC
  Administered 2020-11-18 – 2020-11-23 (×6): 5 mg via ORAL
  Filled 2020-11-17 (×6): qty 1

## 2020-11-17 MED ORDER — IOHEXOL 300 MG/ML  SOLN
50.0000 mL | Freq: Once | INTRAMUSCULAR | Status: AC | PRN
  Administered 2020-11-17: 10 mL

## 2020-11-17 MED ORDER — LIDOCAINE VISCOUS HCL 2 % MT SOLN
OROMUCOSAL | Status: AC
  Filled 2020-11-17: qty 15

## 2020-11-17 NOTE — Progress Notes (Signed)
Daily Progress Note   Patient Name: [name] [name]       Date: 11/17/2020 DOB: 10-02-1930  Age: 87 y.o. MRN#: [id number] Attending Physician: Courtney Heys, MD Primary Care Physician: Hoyt Koch, MD Admit Date: 11/04/2020  Reason for Consultation/Follow-up: Establishing goals of care  Subjective: Chart Reviewed. Updates Received. Patient Assessed.   Patient is awake alert and oriented. She denies pain or shortness of breath. PEG tube secured by IR this morning and is ready for use. Patient states she is ready for feedings to resume and is hopeful "it is working properly". She reports drinking some ensure this morning and a few bites of her tray. States the food is not what she is used to.   We discussed goals of care. [name] states her goals remains the same and her wishes are to "the doctors to do everything possible to keep her living!" I created space and opportunity to have open discussion with concerns for patient's improvement and poor nutrition. She verbalized understanding expressing her plans to improve and get better.   I discussed at length her full code status with consideration to her current illness and co-morbidities. Patient is clear in her expressed wishes to remain a full code with aggressive interventions.   Encouraged importance of ongoing family discussions regarding plan of care.   No family at the bedside. Permission was given to speak with [name] [name], HCPOA, however unable to reach.    Length of Stay: 13 days  Vital Signs: BP 123/74 (BP Location: Left Arm)   Pulse 73   Temp 97.7 F (36.5 C) (Oral)   Resp 20   Ht 5\' 4"  (1.626 m)   Wt 33 kg   SpO2 97%   BMI 12.49 kg/m  SpO2: SpO2: 97 % O2 Device: O2 Device: Room Air O2 Flow Rate: O2 Flow Rate (L/min): 2 L/min  Physical Exam: NAD, cachectic, chronically ill appearing RRR Diminished bilaterally Soft, tender, no distension, + bowel sounds Abdominal dressings intact AAA x3, mood appropriate              Palliative Care Assessment & Plan  HPI: 87 y.o. female  with past medical history of UIP on 2L O2, chronic prednisone, RA, history of DVT/PE, mild CAD, and SBO s/p ex lap February 2022 with G tube placement since removed admitted on 11/04/2020 to CIR following a hospitalization 4/15-4/27 for abdominal pain and wound drainage. She was admitted for a few weeks in February - March 9485 with complications after exploratory laparotomy requiring TPN and G tube placement which was ultimately removed, the patient discharge to SNF for less than 2 weeks, then returned home with 24 hours care. During hospitalization 4/15-27, CT of abdomen pelvis showed no acute process in the abdomen or pelvis. General surgery consulted initially with wound VAC later discontinued changed towet-to-dry dressings. Due to patient's poor p.o. intake interventional radiology consulted as well as ongoing significant dysphagia,G-tube was placed 10/29/2020 per interventional radiology. Patient did have some thrush and mouth pain CT maxillofacial showed fairly diffuse mucosal enhancement within the oral cavity likely reflecting sequela of reported thrush/mucositis. Dr.Owsleyconsulted and felt her lower lip was sinking into her lower implants causing some ulcerationof both sides of her inner lip and maintained on chlorhexidine. In CIR, patient has not tolerated therapy well. Continues to have abdominal pain. Feeding tube fell out with balloon inflated on 4/28 - has since been replaced. PMT has been reconsulted to further discuss [place].   Code Status:  Full code  Goals of Care/Recommendations:  Patient is clear in expressed goals to continue to treat the treatable aggressively and remaining a full code. States she wants to live and not interesting in discussing anything differently.   Encouraged importance of ongoing discussions regarding goals of care.   PMT will continue to support and follow as needed.   Prognosis:  GUARDED-POOR  Discharge Planning: To Be Determined  Thank you for allowing the Palliative Medicine Team to assist in the care of this patient.  Time Total: 40 min.   Visit consisted of counseling and education dealing with the complex and emotionally intense issues of symptom management and palliative care in the setting of serious and potentially life-threatening illness.Greater than 50%  of this time was spent counseling and coordinating care related to the above assessment and plan.  Alda Lea, AGPCNP-BC  Palliative Medicine Team [phone]

## 2020-11-17 NOTE — Progress Notes (Signed)
Patient ID: [name] [name], female   DOB: Nov 30, 1930, 87 y.o.   MRN: [id number]  Met with pt to inform of team conference progress and the places she had wanted to pursue can not offer a bed for her due to her care needs. Discussed will need to expand bed search and see what offers she will have. Attempted to contact [name] but her VM box is full. Will try later. Pt wanted to now the reason she is so tired and is not used to sleeping so much. Discussed her body is trying to heal from the wounds she has and she needs to eat more for energy. Continue to work on discharge plan.

## 2020-11-17 NOTE — Progress Notes (Signed)
PA [name] was made aware regarding Pt's v/s. BP 84/56 ( manualy), PEG tube still on hold due to PEG wasn't placed.Pt has very poor appetite. PA ordered IVF-D5 1/2 NS at 75 cc/min.

## 2020-11-17 NOTE — Procedures (Signed)
Pre procedural Dx: Dysphagia, poorly functioning feeding tube. Post procedural Dx: Same  Successful fluoroscopic guided replacement of exisitng 20 Fr gastrostomy tube.   The feeding tube is ready for immediate use.  EBL: None  Complications: None immediate.  Jay Nabila Albarracin, MD Pager #: [phone]  

## 2020-11-17 NOTE — Patient Care Conference (Signed)
Inpatient RehabilitationTeam Conference and Plan of Care Update Date: 11/17/2020   Time: 11:40 AM    Patient Name: [name] [name]      Medical Record Number: [id number]  Date of Birth: 1930-09-03 Sex: Female         Room/Bed: 4W19C/4W19C-01 Payor Info: Payor: MEDICARE / Plan: MEDICARE PART A AND B / Product Type: *No Product type* /    Admit Date/Time:  11/04/2020 12:46 PM  Primary Diagnosis:  [facility] Problems: Principal Problem:   Debility Active Problems:   Protein-calorie malnutrition, severe   Pressure injury of skin    Expected Discharge Date: Expected Discharge Date:  (SNF)  Team Members Present: Physician leading conference: Dr. Courtney Heys Care Coodinator Present: Dorthula Nettles, RN, BSN, CRRN;Becky Dupree, LCSW Nurse Present: Dorthula Nettles, RN PT Present: Stacy Gardner, PT OT Present: Lillia Corporal, OT SLP Present: Charolett Bumpers, SLP PPS Coordinator present : Gunnar Fusi, SLP     Current Status/Progress Goal Weekly Team Focus  Bowel/Bladder   incontinent of B/B. LAsr BM-5/8  pt will regain function of B/B  assess q shift and PRN, toileting q 2 hours   Swallow/Nutrition/ Hydration             ADL's   Min-Mod for UB d/t fatigue/pain, Max A LB bathe/dress, transfers Min/Mod pending fatigue/pain, Max A hygiene  LB dress Min, toileting and transfers CGA  EOB/OOB activity, positioning bed level for skin, standing balance if able to tolerance, posture, core strength, energy conservation   Mobility   min A-mod A transfers, min A bed mob, very poor activity tolerance, pain, multiple PED dislodging  min A-mod A  inc tolerance to activity, bed mob, transfers, initiate gait, WC mob   Communication             Safety/Cognition/ Behavioral Observations            Pain   pt c/o abdominal pain controlled with current redimen  pain<3  assess pain q shift and PRN   Skin   abdominal wounds, PEG  tube site. continue current treatment.  continued healing of  wound without complication, pt will be free of breakdown or infection  assess skin q shift and PRN, dressing change as ordered     Discharge Planning:  SNF bed search many facilities have declined due to amount of care she requires and wound issues. Expanding search for SNF bed   Team Discussion: BMI down to 12.49, pulled PEG out Sunday night, going down to IR today to have it replaced. Need to know if was sutured in, ab binder will need to stay in place at all times. BP still low, can stop IVF this evening. Patient on Marinol and Remeron. Incontinent B/B, has constant pain and is given Oxy and Gabapentin, but is able to rest well. Has multiple wounds to the skin. Looking for a SNF bed but she is going to require a lot of care. She still wants full interventions.  Patient on target to meet rehab goals: no, limited by pain, fatigues quickly, min/mod assist to the toilet. Max assist for all ADL's. Not tolerating the reduced schedule. Can't tolerate sitting up. SLP discharged patient.  *See Care Plan and progress notes for long and short-term goals.   Revisions to Treatment Plan:  Added Marinol for appetite, Midodrine for low BP.  Teaching Needs: Family education, medication management, pain management, skin/wound care, PEG management, bowel/bladder management, transfer training, gait training, balance training, endurance training, safety awareness.  Current  Barriers to Discharge: Decreased caregiver support, Medical stability, Home enviroment access/layout, Incontinence, Wound care, Lack of/limited family support, Weight, Medication compliance, Behavior and Nutritional means  Possible Resolutions to Barriers: Continue current medications, provide nutritional support, offer emotional support.     Medical Summary Current Status: PEG came out again- to be sutured in; incontinent x2; 9/10 gum/abd wound pain; slightly better with Oxy/Gabapentin; on IVFs since pEG was out x 2 days; D51/2NS.   Barriers to Discharge: Decreased family/caregiver support;Home enviroment access/layout;Nutrition means;Incontinence;Medical stability;Weight;Wound care;Medication compliance  Barriers to Discharge Comments: hard to place- cannot find SNF bed so far; limited by pain with therapy Possible Resolutions to Barriers/Weekly Focus: fatigues VERY fast; will see if needs to increase midodrine and Marinol for appetite and low BP. not tolerating lower schedule of therapy- d/c SNF when can place?- picking at PEG- abd binder won't allow to add any tension to it- not up in w/c >1 hr- x1- not met any goals with therapy!   Continued Need for Acute Rehabilitation Level of Care: The patient requires daily medical management by a physician with specialized training in physical medicine and rehabilitation for the following reasons: Direction of a multidisciplinary physical rehabilitation program to maximize functional independence : Yes Medical management of patient stability for increased activity during participation in an intensive rehabilitation regime.: Yes Analysis of laboratory values and/or radiology reports with any subsequent need for medication adjustment and/or medical intervention. : Yes   I attest that I was present, lead the team conference, and concur with the assessment and plan of the team.   Cristi Loron 11/17/2020, 4:47 PM

## 2020-11-17 NOTE — Progress Notes (Signed)
Occupational Therapy Session Note  Patient Details  Name: [name] [name] MRN: [id number] Date of Birth: 1931-02-28  Today's Date: 11/17/2020 OT Missed Time: 44 Minutes Missed Time Reason: X-Ray   Short Term Goals: Week 1:  OT Short Term Goal 1 (Week 1): Pt will complete LB dressing with Mod A OT Short Term Goal 1 - Progress (Week 1): Progressing toward goal OT Short Term Goal 2 (Week 1): Pt will complete 1 grooming task while standing at the sink to increase standing endurance OT Short Term Goal 2 - Progress (Week 1): Not met OT Short Term Goal 3 (Week 1): Pt will complete sit<stand during toileting with Min A and LRAD OT Short Term Goal 3 - Progress (Week 1): Progressing toward goal Week 2:  OT Short Term Goal 1 (Week 2): STGs = LTGs d/t ELOS   Skilled Therapeutic Interventions/Progress Updates:    Pt greeted at time of scheduled therapy session with tech in room prepping pt to leave for IR for imaging. Pt missed 45 minutes of OT as pt was out of room for IR/Xray. Will continue to follow.   Therapy Documentation Precautions:  Precautions Precautions: Fall Precaution Comments: abdominal binder to protect PEG site, oral thrush/mucositis and mouth ulceration, 2L 02 Restrictions Weight Bearing Restrictions: No     Therapy/Group: Individual Therapy  Viona Gilmore 11/17/2020, 7:15 AM

## 2020-11-17 NOTE — Progress Notes (Signed)
PROGRESS NOTE   Subjective/Complaints:  Pt reports that "PEG was fixed' but it wasn't-  Breakfast in front of her, but hadn't eaten anything at this time.  Says "taking Boost" , but not clear- couldn't get TFs again last night due to lack of PEG- they are sending someone to suture the PEG in- and if that doesn't work, will do pull through gastrotomy.   On IVFs- D51/2 NS, at 75cc/hour currently.  Pt reports since last week, pain is a little better- since added gabapentin- rates pain ~ 10% better- Asked me to help with not getting Am pain meds early enough- will schedule pain meds at 6am.    ROS:  Pt denies SOB, abd pain, CP, N/V/C/D, and vision changes  Objective:   DG Abd Portable 1V  Result Date: 11/16/2020 CLINICAL DATA:  Peg tube malfunction.  Foley put in place. EXAM: PORTABLE ABDOMEN - 1 VIEW COMPARISON:  X-ray abdomen 11/05/2020 FINDINGS: Percutaneous gastrostomy tube with tip and likely inflated balloon overlying the the antral region/pylorus. Partial opacification of the gastric lumen and proximal duodenum. PO contrast noted to opacify the distal colon. The bowel gas pattern is normal. No radio-opaque calculi or other significant radiographic abnormality are seen. IMPRESSION: Percutaneous gastrostomy tube with tip in the region of the gastric antrum/pylorus Electronically Signed   By: Iven Finn M.D.   On: 11/16/2020 00:59   No results for input(s): WBC, HGB, HCT, PLT in the last 72 hours. No results for input(s): NA, K, CL, CO2, GLUCOSE, BUN, CREATININE, CALCIUM in the last 72 hours.  Intake/Output Summary (Last 24 hours) at 11/17/2020 1011 Last data filed at 11/17/2020 0825 Gross per 24 hour  Intake 775.17 ml  Output --  Net 775.17 ml       Physical Exam: Vital Signs Blood pressure 123/74, pulse 73, temperature 97.7 F (36.5 C), temperature source Oral, resp. rate 20, height 5\' 4"  (1.626 m), weight 33 kg, SpO2  97 %.     General: awake, alert, but sitting up in front of breakfast tray- nothing touched;  NAD HENT: conjugate gaze; oropharynx moist CV: regular rate; no JVD Pulmonary: CTA B/L; no W/R/R- good air movement GI: soft, NT, ND, (+)BS- PEG is out- foley in place- severely cachetic- BMI down to 12.49! Psychiatric: interactive;  Neurological: alert Musculoskeletal:     Cervical back: Normal range of motion and neck supple.     Comments: No edema or tenderness in extremities  Skin: Has a stage 3- tiny- less than pencil diameter, it's so small- qtip wouldn't fit easily- on coccyx -also has a barely open- very superficial spot on L inner buttock The R lumbar and other spot on coccyx have healed over-  PEG site- burning skin around PEG- due to gastric contents- covered with badage C/D/I- pt didn't want me to assess this AM Neurological:     Mental Status: She is alert.     Comments: Alert and oriented HOH Motor: B/l UE: 4+/5 proximal to distal RLE: 4/5 proximal to distal LLE: 4/5 HF, KE, 3-/5 ADF     Assessment/Plan: 1. Functional deficits which require 3+ hours per day of interdisciplinary therapy in a comprehensive inpatient rehab  setting.  Physiatrist is providing close team supervision and 24 hour management of active medical problems listed below.  Physiatrist and rehab team continue to assess barriers to discharge/monitor patient progress toward functional and medical goals  Care Tool:  Bathing    Body parts bathed by patient: Right arm,Left arm,Chest,Abdomen,Front perineal area,Buttocks,Right upper leg,Left upper leg,Face   Body parts bathed by helper: Right lower leg,Left lower leg     Bathing assist Assist Level: Minimal Assistance - Patient > 75%     Upper Body Dressing/Undressing Upper body dressing   What is the patient wearing?: Pull over shirt    Upper body assist Assist Level: Minimal Assistance - Patient > 75%    Lower Body Dressing/Undressing Lower  body dressing      What is the patient wearing?: Incontinence brief,Pants     Lower body assist Assist for lower body dressing: Maximal Assistance - Patient 25 - 49%     Toileting Toileting    Toileting assist Assist for toileting: Moderate Assistance - Patient 50 - 74% Assistive Device Comment: incontinent brief   Transfers Chair/bed transfer  Transfers assist  Chair/bed transfer activity did not occur: Safety/medical concerns  Chair/bed transfer assist level: Moderate Assistance - Patient 50 - 74%     Locomotion Ambulation   Ambulation assist   Ambulation activity did not occur: Safety/medical concerns          Walk 10 feet activity   Assist  Walk 10 feet activity did not occur: Safety/medical concerns        Walk 50 feet activity   Assist Walk 50 feet with 2 turns activity did not occur: Safety/medical concerns         Walk 150 feet activity   Assist Walk 150 feet activity did not occur: Safety/medical concerns         Walk 10 feet on uneven surface  activity   Assist Walk 10 feet on uneven surfaces activity did not occur: Safety/medical concerns         Wheelchair     Assist Will patient use wheelchair at discharge?: Yes (pt will likely require WC at DC however pt unable to tolerate WC transfer or assessment on this date) Type of Wheelchair: Manual Wheelchair activity did not occur: Safety/medical concerns         Wheelchair 50 feet with 2 turns activity    Assist    Wheelchair 50 feet with 2 turns activity did not occur: Safety/medical concerns       Wheelchair 150 feet activity     Assist  Wheelchair 150 feet activity did not occur: Safety/medical concerns       Blood pressure 123/74, pulse 73, temperature 97.7 F (36.5 C), temperature source Oral, resp. rate 20, height 5\' 4"  (1.626 m), weight 33 kg, SpO2 97 %.  Medical Problem List and Plan: 1.  Debility secondary to abdominal wall  cellulitis/sepsis complicated by chronic hypoxic respiratory failure/RA             -patient may not shower             -ELOS/Goals: 14-17 days/Min A             will change to 1x/day of PT and OT- and look for SNF bed- since so low function per pt and PT/OT  -con't PT and OT- pt now LOSING weight! No matter what we do.  2.  Impaired mobility -DVT/anticoagulation: Continue Lovenox             -  antiplatelet therapy: N/A 3. Pain Management: Tylenol as needed  4/29- tried tramadol - didn't work- and try Norco 5/325 mg q4 hours prn  4/30: having 10/10 pain with dressing change, continue Norco and give 30 minutes prior to dressing change, Caryl Pina will pass onto oncoming nurse  5/1: AST elevated and pain prohibiting participation with therapy. Switch to oxycodone for better pain control and less tylenol.   5/2- AST is 42- very slightly elevated, likely from frequent tylenol usage- will ask nursing to use Oxy and not tylenol for pain.   5/3- changed Tylenol order only for fever- con't oxy which pt feels is real helpful for gum pain.   5/4- reinforced to nursing- con't regimen  5/5- getting pain meds around the clock- con't regimen working slightly better  5/6- will try Adding Gabapentin 400 mg QHS and see how does with pain.   5/7- c/o pain, but appeared less uncomfortable/less crying out this AM- con't regimen  5/8- will increase Gabapentin to 300 mg TID and monitor-c on't rest of regimen  5/9: encouraged use of oragel for mouth pain.   5/10- pt said pain 10% better with gabapentin addition- will schedule Oxycodone 5 mg  At 6am daily.  4. Mood: Remeron 15 mg nightly  5/3- will change to 30 mg QHS- is better dose for appetite             -antipsychotic agents: N/A 5. Neuropsych: This patient is capable of making decisions on her own behalf. 6. Skin/Wound Care: Routine skin checks.  Foam dressing to umbilicus and change every 3 days as needed soiling.  Apply saline moistened 2 x 2 to abdomen wound  daily cover with dry 2 x 2 and tape.  Apply dry split thickness gauze underneath G-tube daily.  4/30: reconsulted wound care as PEG site with some leaking TF  5/2- said it's due to fistula- won't follow, but remain available. Also has 3- Stage II small spots on coccyx x2 and 1 on L inner buttock- con't wound care/foam  5/4- will put on air mattress since not moving and has 3 Stage II's-  5/5- spoke to nurse- they will get pt air mattress due to BMI 13- extrmely cachetic and has pressure ulcers x3 Stage II on backside.    5/7- has a tiny stage III and small stage II on L inner buttock- con't air mattress and wound care 7. Fluids/Electrolytes/Nutrition: Routine in and outs.             CMP ordered 8.  Rheumatoid arthritis.  Continue prednisone. 9.  Chronic hypoxic respiratory failure.  Oxygen therapy as directed continue chronic prednisone.             Monitor O2 sats with increased exertion 10.  Dysphagia.  Dysphagia #1 thin liquids.  G-tube replaced 10/29/2020 per interventional radiology.  Dietary follow-up             Advance diet as tolerated  4/29- PEG- fell out- got back in by radiology-but has high risk of coming out- came out with balloon intact- con't to monitor   5/5- gastric contents burning skin- WOC consulted- will change orders.  5/8- pt says she's eating, but no improvement in weight- con't to encourage   5/9: PEG fell out- consulting IR regarding replacement.  5/10- to suture PEG in today!  11.  Thrush/mouth pain/odynophagia.  Follow Dr. Benson Norway  4/29- try Norco/Oxycodone- 5/325 mg Q4 hours prn- thrush appears to be improved. If doesn't improve, will change to  Oxycodone- has no bad reactions to pain meds in past.   5/2- con't oxy prn- don't use tylenol  5/3- oxy making a big difference- con't oxy- changed tylenol to be only for fever  5/8- hasn't had pain meds since last evening- added gabapentin 300 mg TID and monitor 12. Severe malnutrition  4/29- con't PEG; TFS nightly- BMI  13.6- con't to push PO- but limited by gum pain.   5/2- BMI up to 13.93-   5/3- BMI up to 14.72 today? Not sure if true or not, appears the same- con't regimen  5/4- BMI up to 15  5/5- BMI 15.02 from 15.1 yesterday- eating 25% of meals yesterday- is better- gum pain and appetite slightly improved.  5/6- is eating more- BMI said 12.53 this AM- this makes no sense- she didn't drop 6 kg in 1 day?  5/7- BMI 13.53 today- will increase Marinol to 2.5 mg BID and see how she does  5/8- BMI 13.22- could also be constipation  5/10- BMI down to 12.49 since no TFs x2 days- trying to get it back in- due to her being so cachetic.  13. Hypotension-   4/29- BP running 80s/40s-will try Low dose Midodrine- 2.5 mg TID with meals- for lower BP.   5/3- BP 90s/50s- better- no dizziness- con't regimen  5/4 will increase to 5 mg TID with meals- since BP 80/s40s again  5/6- BP much better 117/60s- con't regimen  5/7- maybe not drinking as much- BP 88/52 today- will push fluids- also TFs weren't run full length last night- con't regimen 14. Loose stools: likely 2/2 TF, continue to monitor  5/7- now constipated- LBM 5/3- will add Senokot 1 tab BID and monitor  5/8- add Miralax daily and gave 1 dose Sorbitol- if no improvement, needs KUB tomorrow  5/10- LBM evening of 5/8- large- con't regimen 15. Iron deficiency: started iron supplement 16. Elevated CBGs: likely secondary to TF- continue to monitor      LOS: 13 days A FACE TO [place] 11/17/2020, 10:11 AM

## 2020-11-17 NOTE — Progress Notes (Signed)
Physical Therapy Session Note  Patient Details  Name: [name] [name] MRN: [id number] Date of Birth: Apr 15, 1931  Today's Date: 11/17/2020 PT Individual Time: 0160 pt missed 45 mins of PT    PT attempted to see pt for skilled therapy session however pt refused to participate 2/2 pain in abdomen s/p PEG tube dislodging overnight and care to that this AM. Pt denied to attempt various treatment options despite best efforts. Pt left as found, All needs in reach and in good condition. Call light in hand.  Nursing aware.            Junie Panning 11/17/2020, 2:06 PM

## 2020-11-18 LAB — GLUCOSE, CAPILLARY
Glucose-Capillary: 165 mg/dL — ABNORMAL HIGH (ref 70–99)
Glucose-Capillary: 85 mg/dL (ref 70–99)
Glucose-Capillary: 98 mg/dL (ref 70–99)
Glucose-Capillary: 114 mg/dL — ABNORMAL HIGH (ref 70–99)

## 2020-11-18 LAB — CREATININE, SERUM
Creatinine, Ser: 0.6 mg/dL (ref 0.44–1.00)
GFR, Estimated: >60 mL/min (ref >60)

## 2020-11-18 NOTE — Progress Notes (Signed)
Occupational Therapy Session Note  Patient Details  Name: [name] [name] MRN: [id number] Date of Birth: [birth year]  Today's Date: 11/18/2020 OT Individual Time: 1031-1055 OT Individual Time Calculation (min): 24 min    Short Term Goals: Week 1:  OT Short Term Goal 1 (Week 1): Pt will complete LB dressing with Mod A OT Short Term Goal 1 - Progress (Week 1): Progressing toward goal OT Short Term Goal 2 (Week 1): Pt will complete 1 grooming task while standing at the sink to increase standing endurance OT Short Term Goal 2 - Progress (Week 1): Not met OT Short Term Goal 3 (Week 1): Pt will complete sit<stand during toileting with Min A and LRAD OT Short Term Goal 3 - Progress (Week 1): Progressing toward goal Week 2:  OT Short Term Goal 1 (Week 2): STGs = LTGs d/t ELOS   Skilled Therapeutic Interventions/Progress Updates:    Pt greeted at time of session sitting up in wheelchair from previous PT session c/o abdominal pain which has been ongoing/constant for this pt. Referring to PEG site, pt stating "Everything is where it needs to be." Offered pt ADL, toileting, washing face, going for a ride, etc but repeatedly declining politely saying "I would like to some day but just not today." Discussed how she needs therapy but doesn't think she can tolerate. Only wanting to go to bed, Partial stand pivot wheelchair > bed Min A with no AD and therapist support and hand rail from bed. Positioned for comfort and skin integrity. Note O2 sats 95% on room air. Call bell in reach all needs met.   Therapy Documentation Precautions:  Precautions Precautions: Fall Precaution Comments: abdominal binder to protect PEG site, oral thrush/mucositis and mouth ulceration, 2L 02 Restrictions Weight Bearing Restrictions: No     Therapy/Group: Individual Therapy  Viona Gilmore 11/18/2020, 7:22 AM

## 2020-11-18 NOTE — Progress Notes (Signed)
Patient ID: [name] [name], female   DOB: July 13, 1930, 87 y.o.   MRN: [id number] Finally reached [name] to discuss expanded bed search and offers along with two awaiting call backs from. She has taken the facilities down and will talk with [name] along with pt and decide upon the offer they wish to pursue. Made aware MD feels medically ready for transfer to next venue. She is to get back with this worker this afternoon with choice. Pt is aware of this.

## 2020-11-18 NOTE — Progress Notes (Signed)
Physical Therapy Session Note  Patient Details  Name: [name] [name] MRN: [id number] Date of Birth: [birth year]  Today's Date: 11/18/2020 PT Individual Time: 0900-0930 PT Individual Time Calculation (min): 30 min   Short Term Goals: Week 2:  PT Short Term Goal 1 (Week 2): pt to demonstrate supine<>sit min A PT Short Term Goal 2 (Week 2): pt to demonstrate functional transfers min A consistently PT Short Term Goal 3 (Week 2): pt to initiate gait  Skilled Therapeutic Interventions/Progress Updates:    Pt received seated in bed, agreeable to PT session. No complaints of pain at rest, has onset of abdominal pain at end of session, nursing notified that pt requesting pain medication. Pt agreeable to get up to w/c this date to sit up until OT session. Pt found to be incontinent of bowel in bed. Rolling L/R with min A for dependent pericare and brief change. Pt is max A to don pants at bed level. Supine to sit with min A for some trunk control. Sit to stand with min A to RW. Pt unable to maintain standing in order to transfer safely with RW. Squat pivot transfer bed to w/c with min A. Pt left seated in w/c in room with needs in reach, quick release belt and chair alarm in place.  Therapy Documentation Precautions:  Precautions Precautions: Fall Precaution Comments: abdominal binder to protect PEG site, oral thrush/mucositis and mouth ulceration, 2L 02 Restrictions Weight Bearing Restrictions: No    Therapy/Group: Individual Therapy   Excell Seltzer, PT, DPT, CSRS  11/18/2020, 9:47 AM

## 2020-11-18 NOTE — Progress Notes (Signed)
PROGRESS NOTE   Subjective/Complaints:  Pt had PEG placed back- not clear if was sutured in- having nurse check, since is covered in dressing if has sutures.   Pain a little better this AM, but admits she's pretty sleepy.  Hasn't eaten breakfast yet, but says "Ate everything in sight yesterday".  Will order calorie count, because not clear what/if pt eating at all.   She asked me if there's anything she can do to get better- I explained focus on eating!    ROS:  Pt denies SOB, abd pain, CP, N/V/C/D, and vision changes   Objective:   IR Replc Gastro/Colonic Tube Percut W/Fluoro  Result Date: 11/17/2020 INDICATION: Inadvertent removal of gastrostomy tube. Patient presents today for fluoroscopic guided exchange. EXAM: FLUOROSCOPIC GUIDED REPLACEMENT OF GASTROSTOMY TUBE COMPARISON:  Fluoroscopic guided gastrostomy tube replacement-11/06/2020; 10/30/2020 MEDICATIONS: None. CONTRAST:  34mL OMNIPAQUE IOHEXOL 300 MG/ML SOLN - administered into the gastric lumen FLUOROSCOPY TIME:  6 seconds (0.1 mGy) COMPLICATIONS: None immediate. PROCEDURE: A timeout was performed prior to the initiation of the procedure. The existing Foley catheter, which was utilized to maintain patency of the gastrostomy tube track, was removed intact and a new 20-French balloon inflatable gastrostomy tube was inserted. The balloon was inflated with approximately 8 cc of saline and pulled against the anterior inner lumen of the stomach and the external disc was cinched. Contrast was injected and a post procedural spot fluoroscopic image was obtained confirming appropriate positioning and functionality of the new gastrostomy tube. A dressing was applied. The patient tolerated the procedure well without immediate postprocedural complication. IMPRESSION: Successful fluoroscopic guided replacement of a new 20-French gastrostomy tube. The gastrostomy tube is ready for immediate  use. Electronically Signed   By: Sandi Mariscal M.D.   On: 11/17/2020 10:34   No results for input(s): WBC, HGB, HCT, PLT in the last 72 hours. Recent Labs    11/18/20 0505  CREATININE 0.60    Intake/Output Summary (Last 24 hours) at 11/18/2020 0842 Last data filed at 11/18/2020 0328 Gross per 24 hour  Intake 1411.14 ml  Output --  Net 1411.14 ml       Physical Exam: Vital Signs Blood pressure 109/66, pulse 89, temperature 98.5 F (36.9 C), temperature source Oral, resp. rate 20, height 5\' 4"  (1.626 m), weight 32 kg, SpO2 95 %.      General: awake, alert, appropriate, asleep initially but woke easily; hasn't eaten breakfast yet; NAD HENT: conjugate gaze; oropharynx moist CV: regular rate; no JVD Pulmonary: CTA B/L; no W/R/R- good air movement GI: soft, NT, ND, (+)BS; PEG in place; also wearing abd binder and has dressing over abd wound and PEG site- cannot see if sutured in  Psychiatric: appropriate Neurological: Alert Musculoskeletal:     Cervical back: Normal range of motion and neck supple.     Comments: No edema or tenderness in extremities  Skin: Has a stage 3- tiny- less than pencil diameter, it's so small- qtip wouldn't fit easily- on coccyx -also has a barely open- very superficial spot on L inner buttock The R lumbar and other spot on coccyx have healed over-  PEG site- burning skin around PEG- due to gastric  contents- covered with badage C/D/I- pt didn't want me to assess this AM Neurological:     Mental Status: She is alert.     Comments: Alert and oriented HOH Motor: B/l UE: 4+/5 proximal to distal RLE: 4/5 proximal to distal LLE: 4/5 HF, KE, 3-/5 ADF     Assessment/Plan: 1. Functional deficits which require 3+ hours per day of interdisciplinary therapy in a comprehensive inpatient rehab setting.  Physiatrist is providing close team supervision and 24 hour management of active medical problems listed below.  Physiatrist and rehab team continue to  assess barriers to discharge/monitor patient progress toward functional and medical goals  Care Tool:  Bathing    Body parts bathed by patient: Right arm,Left arm,Chest,Abdomen,Front perineal area,Buttocks,Right upper leg,Left upper leg,Face   Body parts bathed by helper: Right lower leg,Left lower leg     Bathing assist Assist Level: Minimal Assistance - Patient > 75%     Upper Body Dressing/Undressing Upper body dressing   What is the patient wearing?: Pull over shirt    Upper body assist Assist Level: Minimal Assistance - Patient > 75%    Lower Body Dressing/Undressing Lower body dressing      What is the patient wearing?: Incontinence brief,Pants     Lower body assist Assist for lower body dressing: Maximal Assistance - Patient 25 - 49%     Toileting Toileting    Toileting assist Assist for toileting: Moderate Assistance - Patient 50 - 74% Assistive Device Comment: incontinent brief   Transfers Chair/bed transfer  Transfers assist  Chair/bed transfer activity did not occur: Safety/medical concerns  Chair/bed transfer assist level: Moderate Assistance - Patient 50 - 74%     Locomotion Ambulation   Ambulation assist   Ambulation activity did not occur: Safety/medical concerns          Walk 10 feet activity   Assist  Walk 10 feet activity did not occur: Safety/medical concerns        Walk 50 feet activity   Assist Walk 50 feet with 2 turns activity did not occur: Safety/medical concerns         Walk 150 feet activity   Assist Walk 150 feet activity did not occur: Safety/medical concerns         Walk 10 feet on uneven surface  activity   Assist Walk 10 feet on uneven surfaces activity did not occur: Safety/medical concerns         Wheelchair     Assist Will patient use wheelchair at discharge?: Yes (pt will likely require WC at DC however pt unable to tolerate WC transfer or assessment on this date) Type of  Wheelchair: Manual Wheelchair activity did not occur: Safety/medical concerns         Wheelchair 50 feet with 2 turns activity    Assist    Wheelchair 50 feet with 2 turns activity did not occur: Safety/medical concerns       Wheelchair 150 feet activity     Assist  Wheelchair 150 feet activity did not occur: Safety/medical concerns       Blood pressure 109/66, pulse 89, temperature 98.5 F (36.9 C), temperature source Oral, resp. rate 20, height 5\' 4"  (1.626 m), weight 32 kg, SpO2 95 %.  Medical Problem List and Plan: 1.  Debility secondary to abdominal wall cellulitis/sepsis complicated by chronic hypoxic respiratory failure/RA             -patient may not shower             -  ELOS/Goals: 14-17 days/Min A             will change to 1x/day of PT and OT- and look for SNF bed- since so low function per pt and PT/OT  -con't PT and OT as tolerate-d looking for SNF bed; .  2.  Impaired mobility -DVT/anticoagulation: Continue Lovenox             -antiplatelet therapy: N/A 3. Pain Management: Tylenol as needed  4/29- tried tramadol - didn't work- and try Norco 5/325 mg q4 hours prn  4/30: having 10/10 pain with dressing change, continue Norco and give 30 minutes prior to dressing change, Caryl Pina will pass onto oncoming nurse  5/1: AST elevated and pain prohibiting participation with therapy. Switch to oxycodone for better pain control and less tylenol.   5/2- AST is 42- very slightly elevated, likely from frequent tylenol usage- will ask nursing to use Oxy and not tylenol for pain.   5/3- changed Tylenol order only for fever- con't oxy which pt feels is real helpful for gum pain.    5/10- pt said pain 10% better with gabapentin addition- will schedule Oxycodone 5 mg  At 6am daily.   5/11- pt admits to being sleepy, but pain better controlled- con't regimen for now 4. Mood: Remeron 15 mg nightly  5/3- will change to 30 mg QHS- is better dose for appetite              -antipsychotic agents: N/A 5. Neuropsych: This patient is capable of making decisions on her own behalf. 6. Skin/Wound Care: Routine skin checks.  Foam dressing to umbilicus and change every 3 days as needed soiling.  Apply saline moistened 2 x 2 to abdomen wound daily cover with dry 2 x 2 and tape.  Apply dry split thickness gauze underneath G-tube daily.  4/30: reconsulted wound care as PEG site with some leaking TF  5/2- said it's due to fistula- won't follow, but remain available. Also has 3- Stage II small spots on coccyx x2 and 1 on L inner buttock- con't wound care/foam  5/4- will put on air mattress since not moving and has 3 Stage II's-  5/5- spoke to nurse- they will get pt air mattress due to BMI 13- extrmely cachetic and has pressure ulcers x3 Stage II on backside.    5/7- has a tiny stage III and small stage II on L inner buttock- con't air mattress and wound care  5/11- on air mattress- will con't and con't wound care 7. Fluids/Electrolytes/Nutrition: Routine in and outs.             CMP ordered 8.  Rheumatoid arthritis.  Continue prednisone. 9.  Chronic hypoxic respiratory failure.  Oxygen therapy as directed continue chronic prednisone.             Monitor O2 sats with increased exertion 10.  Dysphagia.  Dysphagia #1 thin liquids.  G-tube replaced 10/29/2020 per interventional radiology.  Dietary follow-up             Advance diet as tolerated  4/29- PEG- fell out- got back in by radiology-but has high risk of coming out- came out with balloon intact- con't to monitor   5/5- gastric contents burning skin- WOC consulted- will change orders.  5/8- pt says she's eating, but no improvement in weight- con't to encourage   5/9: PEG fell out- consulting IR regarding replacement.  5/10- to suture PEG in today!   5/11- Will see if sutured in-  wasn't in note that was or not.  11.  Thrush/mouth pain/odynophagia.  Follow Dr. Benson Norway  4/29- try Norco/Oxycodone- 5/325 mg Q4 hours prn- thrush  appears to be improved. If doesn't improve, will change to Oxycodone- has no bad reactions to pain meds in past.   5/2- con't oxy prn- don't use tylenol  5/3- oxy making a big difference- con't oxy- changed tylenol to be only for fever  5/8- hasn't had pain meds since last evening- added gabapentin 300 mg TID and monitor 12. Severe malnutrition  4/29- con't PEG; TFS nightly- BMI 13.6- con't to push PO- but limited by gum pain.   5/2- BMI up to 13.93-   5/3- BMI up to 14.72 today? Not sure if true or not, appears the same- con't regimen  5/4- BMI up to 15  5/5- BMI 15.02 from 15.1 yesterday- eating 25% of meals yesterday- is better- gum pain and appetite slightly improved.  5/6- is eating more- BMI said 12.53 this AM- this makes no sense- she didn't drop 6 kg in 1 day?  5/7- BMI 13.53 today- will increase Marinol to 2.5 mg BID and see how she does  5/8- BMI 13.22- could also be constipation  5/10- BMI down to 12.49 since no TFs x2 days- trying to get it back in- due to her being so cachetic.  5/11- BMI 12.11- getting calorie count since pt swears she's eating- I explained she will not live much longer if she doesn't eat! Pt still wants full care/code.   13. Hypotension-   4/29- BP running 80s/40s-will try Low dose Midodrine- 2.5 mg TID with meals- for lower BP.   5/3- BP 90s/50s- better- no dizziness- con't regimen  5/4 will increase to 5 mg TID with meals- since BP 80/s40s again  5/6- BP much better 117/60s- con't regimen  5/7- maybe not drinking as much- BP 88/52 today- will push fluids- also TFs weren't run full length last night- con't regimen  5/11- stopped IVFs today that were placed due to lack of PEG x 2 days. BP better 109/66 this AM- con't Midodrine.  14. Loose stools: likely 2/2 TF, continue to monitor  5/7- now constipated- LBM 5/3- will add Senokot 1 tab BID and monitor  5/8- add Miralax daily and gave 1 dose Sorbitol- if no improvement, needs KUB tomorrow  5/10- LBM evening of  5/8- large- con't regimen 15. Iron deficiency: started iron supplement 16. Elevated CBGs: likely secondary to TF- continue to monitor      LOS: 14 days A FACE TO FACE EVALUATION WAS PERFORMED  Alyrica Thurow 11/18/2020, 8:42 AM

## 2020-11-19 ENCOUNTER — Telehealth: Payer: Self-pay | Admitting: Pulmonary Disease

## 2020-11-19 LAB — GLUCOSE, CAPILLARY
Glucose-Capillary: 134 mg/dL — ABNORMAL HIGH (ref 70–99)
Glucose-Capillary: 109 mg/dL — ABNORMAL HIGH (ref 70–99)
Glucose-Capillary: 97 mg/dL (ref 70–99)
Glucose-Capillary: 103 mg/dL — ABNORMAL HIGH (ref 70–99)

## 2020-11-19 MED ORDER — SILVER NITRATE-POT NITRATE 75-25 % EX MISC
1.0000 "application " | CUTANEOUS | Status: DC | PRN
  Filled 2020-11-19: qty 1

## 2020-11-19 NOTE — Progress Notes (Signed)
Physical Therapy Weekly Progress Note  Patient Details  Name: [name] [name] MRN: [id number] Date of Birth: [birth year]  Beginning of progress report period: Nov 19, 2020 End of progress report period: Nov 19, 2020  Today's Date: 11/19/2020 PT Individual Time: 0800-0810 PT Individual Time Calculation (min): 10 min   Patient has met 1 of 3 short term goals.  Pt has been profoundly limited 2/2 difficulties with multiple PEG tube dislodgments, global fatigue and weakness, poor activity tolerance, consistent pain, and overall medical needs.   Patient continues to demonstrate the following deficits muscle weakness, decreased cardiorespiratoy endurance and decreased sitting balance, decreased standing balance, decreased postural control and decreased balance strategies and therefore will continue to benefit from skilled PT intervention to increase functional independence with mobility.  Patient progressing toward long term goals..  Continue plan of care.  PT Short Term Goals Week 1:  PT Short Term Goal 1 (Week 1): pt to demonstrated supine<>sit min A PT Short Term Goal 1 - Progress (Week 1): Not met PT Short Term Goal 2 (Week 1): pt to demosntrated functional transfer min A with LRAD PT Short Term Goal 2 - Progress (Week 1): Not met PT Short Term Goal 3 (Week 1): pt to demonstrate gait 25' min A with LRAD PT Short Term Goal 3 - Progress (Week 1): Not met Week 2:  PT Short Term Goal 1 (Week 2): pt to demonstrate supine<>sit min A PT Short Term Goal 2 (Week 2): pt to demonstrate functional transfers min A consistently PT Short Term Goal 3 (Week 2): pt to initiate gait  Skilled Therapeutic Interventions/Progress Updates:    pt received in bed and only agreeable to repositioning in bed, pt denied to change clothes, wash face, sit EOB or transfer OOB 2/2 pain and feeling extremely tired. Pt educated on mobility to assist in energy levels, improving strength and tolerance to activity and she agreed  but further denied. Pt then directed in upward scooting mod A and pillows replaced for skin integrity. Pt reported "a lot" of pain and nursing made aware. Pt left in bed, All needs in reach and in good condition. Call light in hand.  And reporting comfort.   Therapy Documentation Precautions:  Precautions Precautions: Fall Precaution Comments: abdominal binder to protect PEG site, oral thrush/mucositis and mouth ulceration, 2L 02 Restrictions Weight Bearing Restrictions: No General:   Vital Signs: Therapy Vitals Temp: 98.8 F (37.1 C) Temp Source: Oral Pulse Rate: 90 Resp: 20 BP: 101/64 Patient Position (if appropriate): Lying Oxygen Therapy SpO2: 94 % O2 Device: Room Air Pain:   Vision/Perception     Mobility:   Locomotion :    Trunk/Postural Assessment :    Balance:   Exercises:   Other Treatments:     Therapy/Group: Individual Therapy  Junie Panning 11/19/2020, 7:52 AM

## 2020-11-19 NOTE — NC FL2 (Signed)
[place] LEVEL OF CARE SCREENING TOOL     IDENTIFICATION  Patient Name: [name] [name] Birthdate: 31-Mar-1931 Sex: female Admission Date (Current Location): 11/04/2020  [facility] and [place] Number:  Herbalist and Address:  [facility], [place] [address], [place], [place] [address]      Provider Number: [id number]  Attending Physician Name and Address:  Courtney Heys, MD  Relative Name and Phone Number:  Nicki Reaper [name]-POA/sister in-law [phone]    Current Level of Care: Other (Comment) (Rehab) Recommended Level of Care: [place] Prior Approval Number:    Date Approved/Denied:   PASRR Number: [id number]  Discharge Plan: SNF    Current Diagnoses: Patient Active Problem List   Diagnosis Date Noted  . Debility 11/04/2020  . Dysphagia   . Oral thrush   . Encounter for dental examination   . Facial swelling   . Atrophy of edentulous alveolar ridge   . Abdominal wall cellulitis 10/24/2020  . Severe sepsis ([place]) 10/23/2020  . Abdominal pain 10/20/2020  . Diarrhea 10/02/2020  . Pressure injury of skin 09/06/2020  . Acute on chronic respiratory failure with hypoxemia ([place]) 08/23/2020  . Protein-calorie malnutrition, severe 08/20/2020  . Fall 01/25/2020  . Other fatigue 01/03/2020  . Left shoulder pain 01/03/2020  . Urinary frequency 01/03/2020  . Acute otalgia, left 09/06/2019  . Unintentional weight loss 07/18/2019  . Personal history of PE (pulmonary embolism) 04/23/2019  . Headache 02/11/2019  . Iron deficiency anemia 12/21/2018  . Cold sore 10/23/2018  . Blood in stool 09/14/2018  . 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 Therapeutic drug monitoring 07/17/2018  . Chronic diastolic CHF (congestive heart failure) ([place]) 12/19/2017  . Rash 08/11/2017  . Chronic respiratory failure with hypoxia ([place]) 03/30/2017  . Angular cheilitis 03/08/2017  . Leg pain 10/25/2016  . Back pain 08/05/2016  . RA  (rheumatoid arthritis) ([place]) 03/31/2015  . Allergic rhinitis   . Varicose vein 09/11/2014  . ILD (interstitial lung disease) ([place]) 06/25/2012  . Chest pain 02/27/2012  . CAD (coronary artery disease) 02/27/2012  . Pruritus 08/18/2009  . Postinflammatory pulmonary fibrosis / RA ILD  06/30/2008  . Constipation 01/03/2008  . Dyslipidemia 06/16/2007    Orientation RESPIRATION BLADDER Height & Weight     Self,Time,Situation,Place  O2 (2 Liters PRN) Incontinent Weight: 70 lb 8.8 oz (32 kg) Height:  5\' 4"  (162.6 cm)  BEHAVIORAL SYMPTOMS/MOOD NEUROLOGICAL BOWEL NUTRITION STATUS      Continent Diet,Feeding tube (Soft-regular diet thin liquids supplemental tube feeds-Osmolite)  AMBULATORY STATUS COMMUNICATION OF NEEDS Skin   Extensive Assist Verbally PU Stage and Appropriate Care PU Stage 1 Dressing: Daily PU Stage 2 Dressing: BID                   Personal Care Assistance Level of Assistance  Bathing,Dressing Bathing Assistance: Limited assistance Feeding assistance: Independent Dressing Assistance: Limited assistance     Functional Limitations Info  Speech,Sight,Hearing Sight Info: Adequate Hearing Info: Adequate Speech Info: Adequate    SPECIAL CARE FACTORS FREQUENCY  PT (By licensed PT),OT (By licensed OT)     PT Frequency: 5x week OT Frequency: 5x week Bowel and Bladder Program Frequency: Timed tolieting every 2-3 hours   Speech Therapy Frequency: 5x week      Contractures Contractures Info: Not present    Additional Factors Info  Code Status,Allergies Code Status Info: Full Code Allergies Info: Crestor, lactose intolerance, Penicillins and Imuran  Current Medications (11/19/2020):  This is the current hospital active medication list Current Facility-Administered Medications  Medication Dose Route Frequency Provider Last Rate Last Admin  . acetaminophen (TYLENOL) tablet 650 mg  650 mg Oral Q6H PRN Lovorn, Megan, MD   650 mg at 11/15/20 2225   Or   . acetaminophen (TYLENOL) suppository 650 mg  650 mg Rectal Q6H PRN Lovorn, Megan, MD      . benzocaine (ORAJEL) 10 % mucosal gel   Mouth/Throat PRN Courtney Heys, MD   Given at 11/08/20 1020  . chlorhexidine (PERIDEX) 0.12 % solution 15 mL  15 mL Mouth/Throat QID Cathlyn Parsons, PA-C   15 mL at 11/19/20 5573  . dronabinol (MARINOL) capsule 2.5 mg  2.5 mg Oral BID AC Lovorn, Megan, MD   2.5 mg at 11/18/20 1719  . enoxaparin (LOVENOX) 100 mg/mL injection 20 mg  20 mg Subcutaneous Q24H Pham, Minh Q, RPH-CPP   20 mg at 11/19/20 0837  . famotidine (PEPCID) 40 MG/5ML suspension 20 mg  20 mg Oral Daily Darlina Sicilian, RPH   20 mg at 11/19/20 2202  . feeding supplement (BOOST / RESOURCE BREEZE) liquid 1 Container  1 Container Oral BID WC Courtney Heys, MD   1 Container at 11/19/20 0853  . feeding supplement (ENSURE ENLIVE / ENSURE PLUS) liquid 237 mL  237 mL Oral Q24H Lovorn, Megan, MD   237 mL at 11/18/20 1719  . feeding supplement (OSMOLITE 1.5 CAL) liquid 840 mL  840 mL Per Tube Q24H Lovorn, Megan, MD 70 mL/hr at 11/18/20 1839 840 mL at 11/18/20 1839  . feeding supplement (PROSource TF) liquid 45 mL  45 mL Per Tube Daily Lovorn, Megan, MD   45 mL at 11/19/20 0840  . fluticasone (FLONASE) 50 MCG/ACT nasal spray 2 spray  2 spray Each Nare Daily PRN Cathlyn Parsons, PA-C   2 spray at 11/08/20 1021  . gabapentin (NEURONTIN) capsule 300 mg  300 mg Oral TID Courtney Heys, MD   300 mg at 11/19/20 5427  . insulin aspart (novoLOG) injection 0-6 Units  0-6 Units Subcutaneous TID WC & HS Lovorn, Megan, MD   2 Units at 11/18/20 0856  . lipase/protease/amylase (CREON) capsule 36,000 Units  36,000 Units Oral TID AC Cathlyn Parsons, PA-C   36,000 Units at 11/19/20 1010  . midodrine (PROAMATINE) tablet 5 mg  5 mg Oral TID WC Lovorn, Megan, MD   5 mg at 11/19/20 0839  . mirtazapine (REMERON SOL-TAB) disintegrating tablet 30 mg  30 mg Oral QHS Lovorn, Megan, MD   30 mg at 11/18/20 2124  . multivitamins  with iron tablet 1 tablet  1 tablet Oral Daily Raulkar, Clide Deutscher, MD   1 tablet at 11/19/20 0838  . mycophenolate (CELLCEPT) capsule 1,000 mg  1,000 mg Oral BID Pham, Minh Q, RPH-CPP   1,000 mg at 11/19/20 0839  . Nintedanib (Ofev) CAPS 100 mg  100 mg Oral BID Cathlyn Parsons, PA-C   100 mg at 11/19/20 0841  . ondansetron (ZOFRAN) tablet 4 mg  4 mg Oral Q6H PRN Cathlyn Parsons, PA-C   4 mg at 11/09/20 0623   Or  . ondansetron (ZOFRAN) injection 4 mg  4 mg Intravenous Q6H PRN Angiulli, Lavon Paganini, PA-C      . oxyCODONE (Oxy IR/ROXICODONE) immediate release tablet 5 mg  5 mg Oral Q4H PRN Raulkar, Clide Deutscher, MD   5 mg at 11/19/20 0839  . oxyCODONE (Oxy IR/ROXICODONE) immediate release tablet  5 mg  5 mg Oral Daily Lovorn, Megan, MD   5 mg at 11/19/20 0545  . polyethylene glycol (MIRALAX / GLYCOLAX) packet 17 g  17 g Oral Daily Lovorn, Megan, MD   17 g at 11/19/20 0839  . predniSONE (DELTASONE) tablet 5 mg  5 mg Oral Q breakfast Cathlyn Parsons, PA-C   5 mg at 11/19/20 0865  . senna (SENOKOT) tablet 8.6 mg  1 tablet Oral BID Lovorn, Megan, MD   8.6 mg at 11/19/20 0838  . silver nitrate applicators applicator 1 application  1 application Topical PRN Lovorn, Megan, MD      . Zinc Oxide (TRIPLE PASTE) 12.8 % ointment   Topical BID Raulkar, Clide Deutscher, MD   Given at 11/18/20 2124     Discharge Medications: Please see discharge summary for a list of discharge medications.  Relevant Imaging Results:  Relevant Lab Results:   Additional Information SSN: [id number] Has been vaccinnated and boosted-will need low air loss mattress due to frail and skin integrity  Rhena Glace, Gardiner Rhyme, LCSW

## 2020-11-19 NOTE — Progress Notes (Signed)
Request for retention suture placement at g-tube insertion site due to multiple dislodgements. Patient currently with 20 Fr balloon retention g-tube which was replaced in IR on 11/17/20.  Upon initial assessment Mepilex + foam dressing + barrier cream on insertion site which was removed, abdominal binder is on however it is up near patient's chest well above g-tube insertion site. Patient exquisitely tender to minimal palpation of skin surrounding g-tube insertion site, the skin is very fragile with numerous small areas of breakdown. Given skin integrity and patient's inability to tolerate minimal manipulation unable to place suture today at bedside. Discussed case with IR attending, Dr. Dwaine Gale, who agrees that suture should not be placed at this time given skin condition.  Recommend continuing to keep insertion site covered with dressing and placing abdominal binder over the insertion site to help keep tube in place. If the tube becomes dislodged again we would recommend having it replaced with a pull through gastrostomy tube which would require moderate sedation in IR.   Above was discussed with patient's nurse Ed and Dr. Celedonio Savage. IR remains available as needed, please call with questions or concerns.   [name] Norse, PA-C

## 2020-11-19 NOTE — Progress Notes (Signed)
Occupational Therapy Weekly Progress Note  Patient Details  Name: [name] [name] MRN: [id number] Date of Birth: Jan 24, 1931  Beginning of progress report period: Nov 12, 2020 End of progress report period: Nov 19, 2020  Today's Date: 11/19/2020 OT Individual Time: 1030-1057 OT Individual Time Calculation (min): 27 min    The pt did not have STGs for this reporting period as she is planning to DC to SNF and awaiting placement. Pt has been severely limited by abdominal pain at her PEG tube site, abdominal wounds, and overall fatigue/low endurance. Pt is on a QD schedule and is often unable to meet these time requirements d/t fatigue and pain. When able to participate, pt is Min/Mod with stand pivot transfers, Max A for LB dressing, Max A for toileting tasks and clothing management. Will continue to follow and maximize indep with plans for DC to SNF.   Patient continues to demonstrate the following deficits: muscle weakness, decreased cardiorespiratoy endurance, impaired timing and sequencing, unbalanced muscle activation, decreased coordination and decreased motor planning and decreased sitting balance, decreased standing balance, decreased postural control and decreased balance strategies and therefore will continue to benefit from skilled OT intervention to enhance overall performance with BADL and Reduce care partner burden.  Patient is showing limited progress toward OT goals as she is very limited by pain and fatigue. Goals have already been downgraded.   OT Short Term Goals Week 2:  OT Short Term Goal 1 (Week 2): STGs = LTGs d/t ELOS OT Short Term Goal 1 - Progress (Week 2): Progressing toward goal Week 3:  OT Short Term Goal 1 (Week 3): Pt will perform toilet transfers consistently with Min A OT Short Term Goal 2 (Week 3): Pt will improve sitting tolerance to EOB/wheelchair for 10 mins for ADL OT Short Term Goal 3 (Week 3): Pt will perform LB dress in most appropriate environment with Mod  A  Skilled Therapeutic Interventions/Progress Updates:    Pt greeted at time of session supine in bed resting and very sleepy but agreeable to try therapy today. Noted to bed soiled with urine, soaking through pants and brief. Able to partially bridge for therapist to remove LB clothing and extend knees to assist. Dependent brief change at bed level for new brief and hygiene. Wound care nurse entered at this time and therapist assisting with bed mobility and emotional support during dressing change which was painful for the pt. Hand off to nursing care.   Therapy Documentation Precautions:  Precautions Precautions: Fall Precaution Comments: abdominal binder to protect PEG site, oral thrush/mucositis and mouth ulceration, 2L 02 Restrictions Weight Bearing Restrictions: No    Therapy/Group: Individual Therapy  Viona Gilmore 11/19/2020, 7:26 AM

## 2020-11-19 NOTE — Progress Notes (Signed)
PROGRESS NOTE   Subjective/Complaints:  Pt's PEG isn't sutured in- wearing ABd binder to keep her from picking-  Pt reports not eating well this AM, but was yesterday- "BBQ plate from sister_ 'ate all of it".  Knows she's low energy- but "ready to work with therapy after pain meds".   Per RN, needs silver nitrate on abd wound since edges rolling under.   ROS:  Pt denies SOB, abd pain, CP, N/V/C/D, and vision changes   Objective:   IR Replc Gastro/Colonic Tube Percut W/Fluoro  Result Date: 11/17/2020 INDICATION: Inadvertent removal of gastrostomy tube. Patient presents today for fluoroscopic guided exchange. EXAM: FLUOROSCOPIC GUIDED REPLACEMENT OF GASTROSTOMY TUBE COMPARISON:  Fluoroscopic guided gastrostomy tube replacement-11/06/2020; 10/30/2020 MEDICATIONS: None. CONTRAST:  41mL OMNIPAQUE IOHEXOL 300 MG/ML SOLN - administered into the gastric lumen FLUOROSCOPY TIME:  6 seconds (0.1 mGy) COMPLICATIONS: None immediate. PROCEDURE: A timeout was performed prior to the initiation of the procedure. The existing Foley catheter, which was utilized to maintain patency of the gastrostomy tube track, was removed intact and a new 20-French balloon inflatable gastrostomy tube was inserted. The balloon was inflated with approximately 8 cc of saline and pulled against the anterior inner lumen of the stomach and the external disc was cinched. Contrast was injected and a post procedural spot fluoroscopic image was obtained confirming appropriate positioning and functionality of the new gastrostomy tube. A dressing was applied. The patient tolerated the procedure well without immediate postprocedural complication. IMPRESSION: Successful fluoroscopic guided replacement of a new 20-French gastrostomy tube. The gastrostomy tube is ready for immediate use. Electronically Signed   By: Sandi Mariscal M.D.   On: 11/17/2020 10:34   No results for input(s): WBC,  HGB, HCT, PLT in the last 72 hours. Recent Labs    11/18/20 0505  CREATININE 0.60    Intake/Output Summary (Last 24 hours) at 11/19/2020 0914 Last data filed at 11/19/2020 0700 Gross per 24 hour  Intake 260 ml  Output --  Net 260 ml       Physical Exam: Vital Signs Blood pressure 101/64, pulse 90, temperature 98.8 F (37.1 C), temperature source Oral, resp. rate 20, height 5\' 4"  (1.626 m), weight 32 kg, SpO2 94 %.       General: awake, alert, appropriate,  Sitting up in bed; less sleepy- more energetic, hasn't eaten more than a few bites of oatmeal, NAD HENT: conjugate gaze; oropharynx moist CV: regular rate; no JVD Pulmonary: CTA B/L; no W/R/R- good air movement GI: soft, NT, ND, (+)BS- PEG and abd binder in place; Abd wound C/D/I (per nurse edges turning under)- BMI 12.11 Psychiatric: appropriate- slightly more interactive; joking a little Neurological: alert  Musculoskeletal:     Cervical back: Normal range of motion and neck supple.     Comments: No edema or tenderness in extremities  Skin: Has a stage 3- tiny- less than pencil diameter, it's so small- qtip wouldn't fit easily- on coccyx -also has a barely open- very superficial spot on L inner buttock The R lumbar and other spot on coccyx have healed over-  PEG site- burning skin around PEG- due to gastric contents- covered with badage C/D/I- pt didn't want me  to assess this AM Neurological:     Mental Status: She is alert.     Comments: Alert and oriented HOH Motor: B/l UE: 4+/5 proximal to distal RLE: 4/5 proximal to distal LLE: 4/5 HF, KE, 3-/5 ADF     Assessment/Plan: 1. Functional deficits which require 3+ hours per day of interdisciplinary therapy in a comprehensive inpatient rehab setting.  Physiatrist is providing close team supervision and 24 hour management of active medical problems listed below.  Physiatrist and rehab team continue to assess barriers to discharge/monitor patient progress toward  functional and medical goals  Care Tool:  Bathing    Body parts bathed by patient: Right arm,Left arm,Chest,Abdomen,Front perineal area,Buttocks,Right upper leg,Left upper leg,Face   Body parts bathed by helper: Right lower leg,Left lower leg     Bathing assist Assist Level: Minimal Assistance - Patient > 75%     Upper Body Dressing/Undressing Upper body dressing   What is the patient wearing?: Pull over shirt    Upper body assist Assist Level: Minimal Assistance - Patient > 75%    Lower Body Dressing/Undressing Lower body dressing      What is the patient wearing?: Incontinence brief,Pants     Lower body assist Assist for lower body dressing: Maximal Assistance - Patient 25 - 49%     Toileting Toileting    Toileting assist Assist for toileting: Moderate Assistance - Patient 50 - 74% Assistive Device Comment: incontinent brief   Transfers Chair/bed transfer  Transfers assist  Chair/bed transfer activity did not occur: Safety/medical concerns  Chair/bed transfer assist level: Minimal Assistance - Patient > 75%     Locomotion Ambulation   Ambulation assist   Ambulation activity did not occur: Safety/medical concerns          Walk 10 feet activity   Assist  Walk 10 feet activity did not occur: Safety/medical concerns        Walk 50 feet activity   Assist Walk 50 feet with 2 turns activity did not occur: Safety/medical concerns         Walk 150 feet activity   Assist Walk 150 feet activity did not occur: Safety/medical concerns         Walk 10 feet on uneven surface  activity   Assist Walk 10 feet on uneven surfaces activity did not occur: Safety/medical concerns         Wheelchair     Assist Will patient use wheelchair at discharge?: Yes (pt will likely require WC at DC however pt unable to tolerate WC transfer or assessment on this date) Type of Wheelchair: Manual Wheelchair activity did not occur: Safety/medical  concerns         Wheelchair 50 feet with 2 turns activity    Assist    Wheelchair 50 feet with 2 turns activity did not occur: Safety/medical concerns       Wheelchair 150 feet activity     Assist  Wheelchair 150 feet activity did not occur: Safety/medical concerns       Blood pressure 101/64, pulse 90, temperature 98.8 F (37.1 C), temperature source Oral, resp. rate 20, height 5\' 4"  (1.626 m), weight 32 kg, SpO2 94 %.  Medical Problem List and Plan: 1.  Debility secondary to abdominal wall cellulitis/sepsis complicated by chronic hypoxic respiratory failure/RA             -patient may not shower             -ELOS/Goals: 14-17 days/Min A  will change to 1x/day of PT and OT- and look for SNF bed- since so low function per pt and PT/OT  -con't PT and OT- looking for SNF bed.  2.  Impaired mobility -DVT/anticoagulation: Continue Lovenox             -antiplatelet therapy: N/A 3. Pain Management: Tylenol as needed  4/29- tried tramadol - didn't work- and try Norco 5/325 mg q4 hours prn  4/30: having 10/10 pain with dressing change, continue Norco and give 30 minutes prior to dressing change, Caryl Pina will pass onto oncoming nurse  5/1: AST elevated and pain prohibiting participation with therapy. Switch to oxycodone for better pain control and less tylenol.   5/2- AST is 42- very slightly elevated, likely from frequent tylenol usage- will ask nursing to use Oxy and not tylenol for pain.   5/3- changed Tylenol order only for fever- con't oxy which pt feels is real helpful for gum pain.    5/10- pt said pain 10% better with gabapentin addition- will schedule Oxycodone 5 mg  At 6am daily.   5/11- pt admits to being sleepy, but pain better controlled- con't regimen for now 4. Mood: Remeron 15 mg nightly  5/3- will change to 30 mg QHS- is better dose for appetite             -antipsychotic agents: N/A 5. Neuropsych: This patient is capable of making decisions on her  own behalf. 6. Skin/Wound Care: Routine skin checks.  Foam dressing to umbilicus and change every 3 days as needed soiling.  Apply saline moistened 2 x 2 to abdomen wound daily cover with dry 2 x 2 and tape.  Apply dry split thickness gauze underneath G-tube daily.  4/30: reconsulted wound care as PEG site with some leaking TF  5/2- said it's due to fistula- won't follow, but remain available. Also has 3- Stage II small spots on coccyx x2 and 1 on L inner buttock- con't wound care/foam  5/4- will put on air mattress since not moving and has 3 Stage II's-  5/5- spoke to nurse- they will get pt air mattress due to BMI 13- extrmely cachetic and has pressure ulcers x3 Stage II on backside.    5/7- has a tiny stage III and small stage II on L inner buttock- con't air mattress and wound care  5/11- on air mattress- will con't and con't wound care  5/12- Will assess bigger abd wound for possible silver nitrate to be done 7. Fluids/Electrolytes/Nutrition: Routine in and outs.             CMP ordered 8.  Rheumatoid arthritis.  Continue prednisone. 9.  Chronic hypoxic respiratory failure.  Oxygen therapy as directed continue chronic prednisone.             Monitor O2 sats with increased exertion 10.  Dysphagia.  Dysphagia #1 thin liquids.  G-tube replaced 10/29/2020 per interventional radiology.  Dietary follow-up             Advance diet as tolerated  4/29- PEG- fell out- got back in by radiology-but has high risk of coming out- came out with balloon intact- con't to monitor   5/5- gastric contents burning skin- WOC consulted- will change orders.  5/8- pt says she's eating, but no improvement in weight- con't to encourage   5/9: PEG fell out- consulting IR regarding replacement.  5/10- to suture PEG in today!   5/11- Will see if sutured in- wasn't in note that was  or not.   5/12- isn't sutured in- will ask IR to see if they can help? Maybe suture it in, because notes doesn't mention it.  11.   Thrush/mouth pain/odynophagia.  Follow Dr. Benson Norway  4/29- try Norco/Oxycodone- 5/325 mg Q4 hours prn- thrush appears to be improved. If doesn't improve, will change to Oxycodone- has no bad reactions to pain meds in past.   5/2- con't oxy prn- don't use tylenol  5/3- oxy making a big difference- con't oxy- changed tylenol to be only for fever  5/8- hasn't had pain meds since last evening- added gabapentin 300 mg TID and monitor 12. Severe malnutrition  4/29- con't PEG; TFS nightly- BMI 13.6- con't to push PO- but limited by gum pain.   5/2- BMI up to 13.93-   5/3- BMI up to 14.72 today? Not sure if true or not, appears the same- con't regimen  5/4- BMI up to 15  5/5- BMI 15.02 from 15.1 yesterday- eating 25% of meals yesterday- is better- gum pain and appetite slightly improved.  5/6- is eating more- BMI said 12.53 this AM- this makes no sense- she didn't drop 6 kg in 1 day?  5/7- BMI 13.53 today- will increase Marinol to 2.5 mg BID and see how she does  5/8- BMI 13.22- could also be constipation  5/10- BMI down to 12.49 since no TFs x2 days- trying to get it back in- due to her being so cachetic.  5/12- BMI 12.11- same- con't to push intake  13. Hypotension-   4/29- BP running 80s/40s-will try Low dose Midodrine- 2.5 mg TID with meals- for lower BP.   5/3- BP 90s/50s- better- no dizziness- con't regimen  5/4 will increase to 5 mg TID with meals- since BP 80/s40s again  5/6- BP much better 117/60s- con't regimen  5/7- maybe not drinking as much- BP 88/52 today- will push fluids- also TFs weren't run full length last night- con't regimen  5/11- stopped IVFs today that were placed due to lack of PEG x 2 days. BP better 109/66 this AM- con't Midodrine.  14. Loose stools: likely 2/2 TF, continue to monitor  5/7- now constipated- LBM 5/3- will add Senokot 1 tab BID and monitor  5/8- add Miralax daily and gave 1 dose Sorbitol- if no improvement, needs KUB tomorrow  5/10- LBM evening of 5/8-  large- con't regimen 15. Iron deficiency: started iron supplement 16. Elevated CBGs: likely secondary to TF- continue to monitor      LOS: 15 days A FACE TO [place] 11/19/2020, 9:14 AM

## 2020-11-19 NOTE — Progress Notes (Signed)
[facility]) Hospital Liaison note:  This is a pending outpatient-based Palliative Care patient. Will continue to follow for disposition.  Please call with any outpatient palliative questions or concerns.  Thank you, Lorelee Market, LPN [facility] Liaison [phone]

## 2020-11-19 NOTE — Consult Note (Addendum)
[facility] Nurse Consult Note: Patient with chronic nonhealing wounds and new coccyx wound noted on admission.  Reason for Consult:Chronic wounds Wound type:surgical abdomen, irritant dermatitis around G tube, stage 3 coccyx pressure injury Pressure Injury POA: Yes Measurement:lower  Abdomen 5 cm x 6 cm x 0.3 cm no undermining or epibole noted.  Superior to abdominal wound:  2 cm x 1 cm x 0.6 cm  Coccyx:  0.2 cm round nonintact (stage 3 pressure injury)- depth 0.2  G tube skin breakdown: extends 3 cm circumferentially, full thickness tissue loss, chronic nonhealing Wound OJJ:KKXF pink nongranulating Drainage (amount, consistency, odor) moderate serosanguinous  No odor.  Periwound:G tube site is denuded and raw.   Dressing procedure/placement/frequency:Cleanse coccyx wound and both abdominal wounds with NS and pat dry. Cut a strip of Aquacel Ag ([facility] #[id number]) and fill dead space.  Cover with dry dressing. Change MOn/Wed/Fri.  Will not follow at this time.  Please re-consult if needed.  Domenic Moras MSN, RN, FNP-BC [facility] Wound, Ostomy, Continence Nurse Pager [phone]

## 2020-11-19 NOTE — Progress Notes (Addendum)
Patient ID: [name] [name], female   DOB: 11-10-1930, 87 y.o.   MRN: [id number]  Spoke with Nicki Reaper regarding choices presented they do not want them and hope [facility] since she has been there before. Have contacted them twice now and left a message for them regarding re-looking at pt's information to reconsider and if can offer a bed. [name] is coming up at lunch time to see pt will meet with them then  12:30 PM Met with [name] when here to discuss with she and pt the bed offers, while here [facility] called back to say will re-look at information-meds, etc. Asked if still had wound vac-does not and asked about a IV medication pt is no longer on. Both pt and [name] are aware of this and will await decision. Continue to work on discharge plan.  1:33 PM Spoke with Ellen-Adm @ [facility] who reports the [facility] medicine she is on cost $12,000 per month and they could not offer a bed due to this. Spoke with pt and [name] regarding this and they plan to reach out to pulmologist to see if less costly med can be place on and/or if can take from home to facility since her tricare insurance covers it. Made Dr Dagoberto Ligas aware of this. Await response from MD

## 2020-11-19 NOTE — Telephone Encounter (Signed)
Left message for EC to call back.

## 2020-11-19 NOTE — Progress Notes (Signed)
Calorie Count Note: Day 1  48-hour calorie count ordered. Please see day 1 results below. RD will follow up tomorrow with day 2 results.  Diet: regular diet, thin liquids Supplements: - Boost Breeze BID with breakfast and lunch meals - Ensure Enlive daily with dinner meal  Day 1: 5/11 Lunch: 147 kcal, 7 grams of protein 5/11 Dinner: 119 kcal, 5 grams of protein 5/12 Breakfast: 50 kcal, 1 gram of protein Supplements: 105 kcal, 4 grams of protein  Day 1 total 24-hour intake: 421 kcal (28% of minimum estimated needs)  17 grams protein (24% of minimum estimated needs)  Nutrition Diagnosis: Severe Malnutrition related to chronic illness as evidenced by severe fat depletion,severe muscle depletion,percent weight loss (23.7% weight loss in less than 4 months).  Goal: Patient will meet greater than or equal to 90% of their needs.  Intervention:  - Continue 48-hour calorie count - Continue nocturnal tube feeds via G-tube of Osmolite 1.5 @ 70 ml/hr to run over 12 hours (1800 to 0600) with ProSource TF 45 ml daily per tube - Continue Boost Breeze BID - Continue Ensure Enlive daily - Continue MVI with minerals daily   Gustavus Bryant, MS, RD, LDN Inpatient Clinical Dietitian Please see AMiON for contact information.

## 2020-11-19 NOTE — Telephone Encounter (Signed)
Duplicate message. 

## 2020-11-20 LAB — GLUCOSE, CAPILLARY
Glucose-Capillary: 117 mg/dL — ABNORMAL HIGH (ref 70–99)
Glucose-Capillary: 101 mg/dL — ABNORMAL HIGH (ref 70–99)
Glucose-Capillary: 116 mg/dL — ABNORMAL HIGH (ref 70–99)
Glucose-Capillary: 127 mg/dL — ABNORMAL HIGH (ref 70–99)

## 2020-11-20 MED ORDER — MORPHINE SULFATE ER 15 MG PO TBCR
15.0000 mg | EXTENDED_RELEASE_TABLET | Freq: Two times a day (BID) | ORAL | Status: DC
  Administered 2020-11-20 – 2020-11-23 (×5): 15 mg via ORAL
  Filled 2020-11-20 (×7): qty 1

## 2020-11-20 NOTE — Discharge Summary (Signed)
Physician Discharge Summary  Patient ID: [name] [name] MRN: [id number] DOB/AGE: 85-Apr-1932 85 y.o.  Admit date: 11/04/2020 Discharge date: 11/23/2020  Discharge Diagnoses:  Principal Problem:   Debility Active Problems:   Protein-calorie malnutrition, severe   Pressure injury of skin DVT prophylaxis Pain management Chronic hypoxic respiratory failure Dysphagia-G-tube placed 10/29/2020 Decreased nutritional storage Hypotension Iron deficiency Rheumatoid arthritis Thrush/mucositis/  Discharged Condition: Stable  Significant Diagnostic Studies: CT ABDOMEN PELVIS WO CONTRAST  Result Date: 10/24/2020 CLINICAL DATA:  Poor appetite. Fungal infection. Worsening weakness. Abdominal wall cellulitis. EXAM: CT ABDOMEN AND PELVIS WITHOUT CONTRAST TECHNIQUE: Multidetector CT imaging of the abdomen and pelvis was performed following the standard protocol without IV contrast. COMPARISON:  10/16/2020 FINDINGS: Lower chest: Interstitial lung disease at the bases, again consistent with usual interstitial pneumonitis. Mild cardiomegaly with trace bilateral pleural fluid. Multivessel coronary artery atherosclerosis. Hepatobiliary: Normal noncontrast appearance of the liver. In addition to lack of IV contrast, arm position degraded exam. The gallbladder is mildly distended, similar to on the prior. No pericholecystic edema or calcified stone. The common duct measures up to 1.2 cm on 33/4 in the pancreatic head, similar. Pancreas: Not well evaluated.  Grossly within normal limits. Spleen: Normal in size, without focal abnormality. Adrenals/Urinary Tract: Normal adrenal glands. No renal calculi or hydronephrosis. The low pelvis is excluded. No upper bladder stones. Stomach/Bowel: Normal stomach, without wall thickening. Extensive colonic diverticulosis. Normal terminal ileum. The cecum is position within the right deep pelvis. The appendix is likely partially visualized on 65/4 and within normal limits. Normal  small bowel caliber. Vascular/Lymphatic: Aortic atherosclerosis. No abdominopelvic adenopathy. Reproductive: Hysterectomy. Other: No abdominal ascites or free intraperitoneal air. Midline anterior abdominal wall defect again identified. Musculoskeletal: Osteopenia. Lumbosacral spondylosis with mild convex left lumbar spine curvature. IMPRESSION: 1. Multifactorial degradation, including lack of IV contrast, arm position, and exclusion of the low pelvis. 2. Given this limitation, no acute process in the abdomen or pelvis. 3. Interstitial lung disease, consistent with usual interstitial pneumonitis, as before. 4. Trace bilateral pleural fluid. 5. Similar gallbladder distension and common duct dilatation, of indeterminate etiology. Consider correlation with bilirubin levels. 6. Coronary artery atherosclerosis. Aortic Atherosclerosis (ICD10-I70.0). Electronically Signed   By: Abigail Miyamoto M.D.   On: 10/24/2020 17:27   DG Abd 1 View  Result Date: 11/05/2020 CLINICAL DATA:  Peg tube placement EXAM: ABDOMEN - 1 VIEW COMPARISON:  None. FINDINGS: Omnipaque 300 injected shows filling of the stomach. No evidence of contrast leakage. Tube balloon is inflated. IMPRESSION: Gastrostomy tube well positioned. No evidence of contrast leakage. Electronically Signed   By: Nelson Chimes M.D.   On: 11/05/2020 14:14   CT HEAD WO CONTRAST  Result Date: 10/24/2020 CLINICAL DATA:  Mental status changes of unknown cause. EXAM: CT HEAD WITHOUT CONTRAST TECHNIQUE: Contiguous axial images were obtained from the base of the skull through the vertex without intravenous contrast. COMPARISON:  01/24/2020 FINDINGS: Brain: There is mild central and cortical atrophy. Periventricular white matter changes are consistent with small vessel disease. There is no intra or extra-axial fluid collection or mass lesion. The basilar cisterns and ventricles have a normal appearance. There is no CT evidence for acute infarction or hemorrhage. Vascular: There  is atherosclerotic calcification of the internal carotid arteries. No hyperdense vessels. Skull: Normal. Negative for fracture or focal lesion. Sinuses/Orbits: Air-fluid level again identified within the LEFT sphenoid air cells. Improved aeration of the ethmoid sinuses. Other: None. IMPRESSION: 1. No evidence for acute intracranial abnormality. 2. Atrophy and small vessel disease.  3. Improved aeration of the ethmoid sinuses. Persistent air-fluid level in the LEFT sphenoid air cells. Electronically Signed   By: Nolon Nations M.D.   On: 10/24/2020 17:14   IR GJ Tube Change  Result Date: 11/06/2020 INDICATION: 87 year-old female with a displaced percutaneous gastrostomy tube. Patient presents for replacement. EXAM: GASTRIC CATHETER REPLACEMENT MEDICATIONS: None ANESTHESIA/SEDATION: None CONTRAST:  29mL OMNIPAQUE IOHEXOL 300 MG/ML SOLN - administered into the gastric lumen. FLUOROSCOPY TIME:  Not recorded COMPLICATIONS: None immediate. PROCEDURE: The Foley catheter maintaining the tract was deflated and removed. A new 8 French balloon retention percutaneous gastrostomy tube was then lubricated and advanced through the stoma and into the stomach. The retention balloon was inflated and pulled snug against the anterior abdominal wall. The external bumper was fixed in place. A gentle hand injection of contrast material was performed followed by fluoroscopic saved image. Contrast outlines the stomach confirming intragastric placement. IMPRESSION: Successful replacement of dislodged 20 French percutaneous gastrostomy tube. Electronically Signed   By: Jacqulynn Cadet M.D.   On: 11/06/2020 10:02   IR Replc Gastro/Colonic Tube Percut W/Fluoro  Result Date: 11/17/2020 INDICATION: Inadvertent removal of gastrostomy tube. Patient presents today for fluoroscopic guided exchange. EXAM: FLUOROSCOPIC GUIDED REPLACEMENT OF GASTROSTOMY TUBE COMPARISON:  Fluoroscopic guided gastrostomy tube replacement-11/06/2020; 10/30/2020  MEDICATIONS: None. CONTRAST:  49mL OMNIPAQUE IOHEXOL 300 MG/ML SOLN - administered into the gastric lumen FLUOROSCOPY TIME:  6 seconds (0.1 mGy) COMPLICATIONS: None immediate. PROCEDURE: A timeout was performed prior to the initiation of the procedure. The existing Foley catheter, which was utilized to maintain patency of the gastrostomy tube track, was removed intact and a new 20-French balloon inflatable gastrostomy tube was inserted. The balloon was inflated with approximately 8 cc of saline and pulled against the anterior inner lumen of the stomach and the external disc was cinched. Contrast was injected and a post procedural spot fluoroscopic image was obtained confirming appropriate positioning and functionality of the new gastrostomy tube. A dressing was applied. The patient tolerated the procedure well without immediate postprocedural complication. IMPRESSION: Successful fluoroscopic guided replacement of a new 20-French gastrostomy tube. The gastrostomy tube is ready for immediate use. Electronically Signed   By: Sandi Mariscal M.D.   On: 11/17/2020 10:34   IR Replc Gastro/Colonic Tube Percut W/Fluoro  Result Date: 10/30/2020 INDICATION: 87 year old woman status post gastrostomy tube placement in February, 2022 with subsequent removal presents today measure radiology for Ree insertion 4 supplemental nutrition requirements. EXAM: Gastrostomy tube Ree insertion through pre-existing access. MEDICATIONS: None ANESTHESIA/SEDATION: Versed 1.5 mg IV; Fentanyl 25 mcg IV Moderate Sedation Time:  10 minutes The patient was continuously monitored during the procedure by the interventional radiology nurse under my direct supervision. CONTRAST:  5 mL of Omnipaque 350-administered into the gastric lumen. FLUOROSCOPY TIME:  Fluoroscopy Time: 1 minutes 12 seconds (3 mGy). COMPLICATIONS: None immediate. PROCEDURE: Informed written consent was obtained from the patient after a thorough discussion of the procedural risks,  benefits and alternatives. All questions were addressed. Maximal Sterile Barrier Technique was utilized including caps, mask, sterile gowns, sterile gloves, sterile drape, hand hygiene and skin antiseptic. A timeout was performed prior to the initiation of the procedure. Following local ligament striation, the pre-existing opening in the left anterior abdominal wall was accessed with a Kumpe the catheter. Intraluminal positioning of the Kumpe the catheter tip was confirmed by administering contrast under fluoroscopy. Tract dilation was performed and 20 French gastrostomy tube was inserted. Contrast administered through the gastrostomy tube confirmed intraluminal positioning. The retention  balloon was inflated with 6 mL of saline. IMPRESSION: Percutaneous gastrostomy tube re-insertion as above. Electronically Signed   By: Miachel Roux M.D.   On: 10/30/2020 08:08   CT MAXILLOFACIAL W CONTRAST  Result Date: 10/29/2020 CLINICAL DATA:  Maxillofacial pain. Additional history provided: Thrush, mouth pain. Mouth pain also related to dental implants screws with irritation. Lip swelling. EXAM: CT MAXILLOFACIAL WITH CONTRAST TECHNIQUE: Multidetector CT imaging of the maxillofacial structures was performed with intravenous contrast. Multiplanar CT image reconstructions were also generated. CONTRAST:  82mL OMNIPAQUE IOHEXOL 300 MG/ML  SOLN COMPARISON:  CT head 10/24/2020. FINDINGS: Osseous: The patient is edentulous. There are several metallic dental prostheses. No acute bony abnormality or aggressive osseous lesion. Orbits: No acute or significant orbital finding. The globes are normal in size and contour. The extraocular muscles and optic nerve sheath complexes are symmetric and unremarkable. No intraorbital mass or abnormal intraorbital enhancement. Sinuses: Moderate volume frothy secretions and fluid level within the left sphenoid sinus. The paranasal sinuses are otherwise normally aerated. Soft tissues: There is fairly  diffuse mucosal enhancement within the oral cavity. Findings likely reflect sequela of reported thrush/mucositis. No appreciable mass within the oral cavity, pharynx or larynx. Limited intracranial: Redemonstrated cerebral atrophy and chronic small vessel ischemic disease. No evidence of acute intracranial abnormality within the field of view. IMPRESSION: Fairly diffuse mucosal enhancement within the oral cavity. Findings likely reflect sequela of reported thrush/mucositis. The patient is edentulous, although there are several metallic dental prostheses. Left sphenoid sinusitis. Electronically Signed   By: Kellie Simmering DO   On: 10/29/2020 13:43   DG Chest Port 1 View  Result Date: 10/23/2020 CLINICAL DATA:  Sepsis, G-tube placed in February 2022, nonhealing wound EXAM: PORTABLE CHEST 1 VIEW COMPARISON:  Portable exam 1609 hours compared to 08/24/2020 FINDINGS: Normal heart size, mediastinal contours, and pulmonary vascularity. Atherosclerotic calcification aorta. Severe pulmonary fibrosis with peripheral and basilar predominance unchanged since an earlier study of 02/12/2020. Underlying emphysematous and bronchitic changes. No definite acute infiltrate, pleural effusion, or pneumothorax. Dextroconvex thoracic scoliosis. IMPRESSION: Changes of COPD and severe pulmonary fibrosis. No acute abnormalities. Aortic Atherosclerosis (ICD10-I70.0) and Emphysema (ICD10-J43.9). Electronically Signed   By: Lavonia Dana M.D.   On: 10/23/2020 16:32   DG Abd Portable 1V  Result Date: 11/16/2020 CLINICAL DATA:  Peg tube malfunction.  Foley put in place. EXAM: PORTABLE ABDOMEN - 1 VIEW COMPARISON:  X-ray abdomen 11/05/2020 FINDINGS: Percutaneous gastrostomy tube with tip and likely inflated balloon overlying the the antral region/pylorus. Partial opacification of the gastric lumen and proximal duodenum. PO contrast noted to opacify the distal colon. The bowel gas pattern is normal. No radio-opaque calculi or other significant  radiographic abnormality are seen. IMPRESSION: Percutaneous gastrostomy tube with tip in the region of the gastric antrum/pylorus Electronically Signed   By: Iven Finn M.D.   On: 11/16/2020 00:59    Labs:  Basic Metabolic Panel: Recent Labs  Lab 11/18/20 0505  CREATININE 0.60    CBC: No results for input(s): WBC, NEUTROABS, HGB, HCT, MCV, PLT in the last 168 hours.  CBG: Recent Labs  Lab 11/20/20 2100 11/21/20 0609 11/21/20 1129 11/21/20 2054 11/22/20 0605  GLUCAP 127* 158* 98 132* 116*   Family history.  Daughter with kidney disease.  Father with myocardial infarction.  Sister with Alzheimer's disease.  Denies any colon cancer esophageal cancer or rectal cancer  Brief HPI:   [name] [name] is a 87 y.o. right-handed female with history of chronic hypoxic respiratory failure/interstitial pneumonia/pulmonary fibrosis  on 2 L oxygen, chronic prednisone/CellCept for rheumatoid arthritis, history of DVT/PE, SBO status post exploratory laparotomy February 2022 and G-tube placement 08/23/2020 that is since been removed.  Per chart review lives alone 1 level home.  Reportedly independent until 2 months ago she has a home health aide a few hours a day 2 days a week.  She had a couple of falls in the past few weeks.  Presented 10/23/2020 with increasing abdominal pain and some wound drainage from prior G-tube and altered mental status as well as right foot drop.  She was admitted few weeks ago in February with complications from her exploratory laparotomy requiring TPN was discharged to skilled nursing facility for short time.  Admission chemistry sodium 146 potassium 5.3 WBC 11,400 hemoglobin 11.9 lactic acid 2.7-3.4, blood cultures no growth to date, COVID-negative.  CT of abdomen pelvis showed no acute process in the abdomen or pelvis.  Cranial CT unremarkable for acute process.  General surgery consulted initially with wound VAC later discontinued changed to wet-to-dry dressings.  Palliative  care consulted for goals of care.  Due to patient's poor p.o. intake interventional radiology consulted as well as ongoing significant dysphagia thus a G-tube was reinserted 10/29/2020 per interventional radiology.  Patient did have some thrush as well as mouth pain CT maxillofacial showed fairly diffuse mucosal enhancement within the oral cavity likely reflecting sequela of reported thrush/mucositis Dr. Benson Norway consulted felt her lower lip was sinking into her lower implants causing some ulceration of both sides of her inner lip and maintained on chlorhexidine.  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  Maintained on Lovenox for DVT prophylaxis.  Therapy evaluations completed due to patient decreased functional mobility was admitted for a comprehensive rehab program.   Hospital Course: [name] [name] was admitted to rehab 11/04/2020 for inpatient therapies to consist of PT, ST and OT at least three hours five days a week. Past admission physiatrist, therapy team and rehab RN have worked together to provide customized collaborative inpatient rehab.  Pertaining to patient's debility cellulitis sepsis complicated by chronic hypoxic respiratory failure she continued to progress with therapies with slow gains.  Subcutaneous Lovenox for DVT prophylaxis.  Pain management currently maintained on Neurontin 300 mg 3 times daily as well as the addition of MS Contin 15 mg every 12 hours and oxycodone for breakthrough pain.  Skin wound pressure injuries to the buttocks follow-up wound care nurse foam dressing to umbilicus area changed every 3 days applied saline moistened 2 x 2 abdomen wound daily cover with 2 x 2 and tape apply dry thick gauze underneath G-tube placement.  Also with a stage III stage II small spots on coccyx x 2 and 1 on left inner buttocks wound care as directed.  Patient did have rheumatoid arthritis prednisone as directed as well as CellCept.  Chronic hypoxic respiratory  failure oxygen needs as directed as well as continued on prednisone.  She is on a dysphagia #1 thin liquid diet and advance to regular.  G-tube placed for nutritional needs per interventional radiology that did dislodge 2 separate incidents with balloon intact and had to be reinserted.  If tube was to dislodge again interventional radiology recommended a pull-through gastrostomy tube.  Marinol was added to help with her appetite.  Patient did have hospital course of thrush mucositis followed per Dr. Benson Norway.  Bowel program established monitor closely while on tube feeds causing some loose stools.  Blood pressure remained soft she continued on  ProAmatine.  She did have some elevated blood sugars felt to be related to tube feeds   Blood pressures were monitored on TID basis and soft and monitored     Rehab course: During patient's stay in rehab weekly team conferences were held to monitor patient's progress, set goals and discuss barriers to discharge. At admission, patient required minimal assist supine to sit minimal assist sit to supine moderate assist stand pivot transfers moderate assist 3 feet rolling walker moderate assist upper body dressing total assist lower body dressing  Physical exam.  Blood pressure 87/55 pulse 78 temperature 97.8 respirations 18 oxygen saturation 96% room air Constitutional.  Patient cachectic HEENT Head.  Normocephalic and atraumatic Eyes.  Pupils round and reactive to light no discharge nystagmus Neck.  Supple nontender no JVD without thyromegaly Cardiac regular rate rhythm not extra sounds or murmur heard Abdomen.  Soft nontender positive bowel sounds without rebound Respiratory effort normal no respiratory distress without wheeze Skin.  Stage III sacral ulcer ulcer on left upper posterior pelvis abdominal wound dressed Neurologic.  Alert oriented Motor.  Bilateral upper extremity 4+/5 proximal to distal Right lower extremity 4/5 proximal to distal Left lower  extremity 4/5 hip flexors, knee extension, 3 -/5 ADF  He/She  has had improvement in activity tolerance, balance, postural control as well as ability to compensate for deficits. He/She has had improvement in functional use RUE/LUE  and RLE/LLE as well as improvement in awareness.  Working with energy conservation.  Patient directed upward scooting moderate assist and pillows replaced for skin integrity.  Max assist to don pants at bed level supine to sit with minimal assist for some trunk control sit to stand with minimal assist to rolling walker.  Patient unable to maintain standing in order to transfer safely with rolling walker.  Squat pivot bed to wheelchair with minimal assist.  Patient demonstrated deficits in muscle weakness decreased cardiorespiratory endurance impaired timing and sequencing need ongoing assist for ADLs.  Due to limited advances and limited assistance at home was felt skilled nursing facility was needed.       Disposition: Skilled nursing facility    Diet: Regular as well as Osmolite 840 mL/70 mL an hour over 12 hours by PEG tube.  Boost feeding supplement 1 container p.o. twice daily daily with meals for by PEG tube.  Ensure 237 mL p.o. daily/Prosource 45 mL daily as needed PEG tube  Special Instructions: No smoking or alcohol  Wound care.  Wound care to area around PEG tube.  Cleanse with tepid tap water, pat gently dry.  Apply 1/8 inch thick layer of triple paste to cover area of PERI tube irritant contact dermatitis.  Top with silver Hydrofiber(Aquacel AG plus advantage,) fashion like a split gauze.  Top with drawtex.  Change twice daily and as needed leakage of effluent from tube site  Change dressing on Monday Wednesday and Friday.  Clean coccyx wound and both abdominal wounds with normal saline and pat dry.  Cut a strip of Aquacel Ag and fill dead space.  Cover with dry dressing  Medications at discharge 1.  Tylenol as needed 2.  Orajel as needed mouth pain 3.   Peridex 0.12% 15 mL 4 times daily 4.  Marinol 2.5 mg p.o. twice daily daily before lunch and supper 5.  Pepcid 20 mg p.o. daily 6.  Neurontin 300 mg p.o. 3 times daily 7.  Flonase 2 sprays each nostril daily as needed 8.  Creon 36 units p.o. 3 times daily  before meals 9.  ProAmatine 5 mg p.o. 3 times daily with meals 10.  Remeron 30 mg p.o. nightly 11.  MS Contin 15 mg p.o. every 12 hours 12.  Multivitamin daily 13.  CellCept 1000 mg p.o. twice daily 14.Nintedanib 100 mg p.o. twice daily 15.  Oxycodone 5 mg every 4 hours as needed pain 16.  MiraLAX daily hold for loose stools 17.  Prednisone 5 mg p.o. daily with breakfast 18.  Zinc oxide/triple paste applied twice daily 2.  PEG tube site 19.  Remicade injection every 2 months. 20.  Nitroglycerin as needed 21.  Flonase 2 sprays each nostril daily as needed  30-35 minutes were spent completing discharge summary and discharge planning     Contact information for follow-up providers    Lovorn, Jinny Blossom, MD Follow up.   Specialty: Physical Medicine and Rehabilitation Why: No follow-up needed Contact information: [address] [place] [address] [phone]        Dwan Bolt, MD Follow up.   Specialty: General Surgery Why: Call for appointment Contact information: [place]. [address] [place] [address] [phone]        Burnell Blanks, MD Follow up.   Specialty: Cardiology Why: Call for appointment Contact information: [place]. [address] [place] [address] [phone]        Owsley, Madison B, DMD Follow up.   Specialty: Dentistry Why: Call for appointment as needed Contact information: [place] [address] [phone]        Mir, Paula Libra, MD Follow up.   Specialties: Interventional Radiology, Diagnostic Radiology, Radiology Why: Call for appointment Contact information: [place] [address] [phone]             Contact information for after-discharge care    Destination    [facility] .   Service: Skilled Nursing Contact information: [address] [place] [phone]                  Signed: Cathlyn Parsons 11/22/2020, 3:57 PM

## 2020-11-20 NOTE — Progress Notes (Signed)
Calorie Count Note: Day 2  48-hour calorie count ordered. Please see day 2 results below.  Diet: regular diet, thin liquids Supplements: - Boost Breeze BID with breakfast and lunch meals - Ensure Enlive daily with dinner meal  Day 2: 5/12 Lunch: 280 kcal, 18 grams of protein 5/12 Dinner: 0 kcal, 0 grams of protein (pt refused tray) 5/13 Breakfast: 53 kcal, 2 gram of protein Supplements: 0 kcal, 0 grams of protein (no documentation about supplements provided in calorie count envelope)  Day 2 total 24-hour intake: 333 kcal (22% of minimum estimated needs)  20 grams protein (29% of minimum estimated needs)  Nutrition Diagnosis: Severe Malnutritionrelated to chronic illnessas evidenced by severe fat depletion,severe muscle depletion,percent weight loss (23.7% weight loss in less than 4 months).  Goal: Patient will meet greater than or equal to 90% of their needs.  Intervention:  - d/c 48-hour calorie count - Continue nocturnal tube feeds via G-tube of Osmolite 1.5 @ 70 ml/hr to run over 12 hours (1800 to 0600) with ProSource TF 45 ml daily per tube - Continue Boost Breeze BID - Continue Ensure Enlive daily - Continue MVI with minerals daily   Gustavus Bryant, MS, RD, LDN Inpatient Clinical Dietitian Please see [facility] for contact information.

## 2020-11-20 NOTE — Progress Notes (Signed)
Physical Therapy Session Note  Patient Details  Name: [name] [name] MRN: [id number] Date of Birth: Jul 31, 1930  Today's Date: 11/20/2020 PT Individual Time: 1510-1520 PT Individual Time Calculation (min): 10 min   Short Term Goals: Week 1:  PT Short Term Goal 1 (Week 1): pt to demonstrated supine<>sit min A PT Short Term Goal 1 - Progress (Week 1): Not met PT Short Term Goal 2 (Week 1): pt to demosntrated functional transfer min A with LRAD PT Short Term Goal 2 - Progress (Week 1): Not met PT Short Term Goal 3 (Week 1): pt to demonstrate gait 25' min A with LRAD PT Short Term Goal 3 - Progress (Week 1): Not met Week 2:  PT Short Term Goal 1 (Week 2): pt to demonstrate supine<>sit min A PT Short Term Goal 1 - Progress (Week 2): Met PT Short Term Goal 2 (Week 2): pt to demonstrate functional transfers min A consistently PT Short Term Goal 2 - Progress (Week 2): Not met PT Short Term Goal 3 (Week 2): pt to initiate gait PT Short Term Goal 3 - Progress (Week 2): Not met Week 3:  PT Short Term Goal 1 (Week 3): pt to demonstrate transfers at min A consistently PT Short Term Goal 2 (Week 3): pt to tolerate static standing for 1 min min A PT Short Term Goal 3 (Week 3): pt to tolerate sitting upright in chair for 45 mins Week 4:     Skilled Therapeutic Interventions/Progress Updates:    pt received in bed attempting to eat lunch but in poor position in bed, pt directed in midline sitting in bed as she denied to attempt sitting EOB or transferring to chair, HOB repositioned for improved upright position and pt limited with this 2/2 pain but at comfort at height therapist set, tray table positioned to more effectively allow pt to feed self. Pt able to take a few bites of sandwich that she had left. Pt declined rest of tray despite encouragement to eat. Pt agreed to trying BLE strengthening exercises in bed however pt demonstrated poor ability to remain awake and required multiple times to awaken then  reported she was too tired to complete therapy today. Pt left in bed, All needs in reach and in good condition. Call light in hand. Pt declined any additional needs.  Therapy Documentation Precautions:  Precautions Precautions: Fall Precaution Comments: abdominal binder to protect PEG site, oral thrush/mucositis and mouth ulceration, 2L 02 Restrictions Weight Bearing Restrictions: No General: PT Amount of Missed Time (min): 20 Minutes PT Missed Treatment Reason: Patient fatigue;Patient unwilling to participate;Pain Vital Signs: Therapy Vitals Temp: 98.9 F (37.2 C) Pulse Rate: 88 Resp: 18 BP: 93/64 Patient Position (if appropriate): Lying Oxygen Therapy SpO2: 100 % O2 Device: Nasal Cannula O2 Flow Rate (L/min): 2 L/min Pain:   Mobility:   Locomotion :    Trunk/Postural Assessment :    Balance:   Exercises:   Other Treatments:      Therapy/Group: Individual Therapy  Junie Panning 11/20/2020, 4:13 PM

## 2020-11-20 NOTE — Progress Notes (Signed)
Inpatient Rehabilitation Care Coordinator Discharge Note  The overall goal for the admission was met for:   Discharge location: No-[facility]  Length of Stay: No-19 days  Discharge activity level: No-MOD ASSIST LEVEL  Home/community participation: Yes  Services provided included: MD, RD, PT, OT, SLP, RN, CM, Pharmacy, Neuropsych and SW  Financial Services: Medicare and Private Insurance: [employer]  Choices offered to/list presented to:PT AND [name]/[facility] IN[facility]  Follow-up services arranged: Other: SNF  Comments (or additional information): PT CONTINUES TO REQUIRE MUCH CARE AND WILL NEED MORE REHAB FOR HER RECOVERY. PALLIATIVE CARE INVOLVED AND PT CONTINUED TO WANT TO BE A FULL CODE AND AGGRESSIVE INTERVENTIONS WHILE HERE.  Patient/Family verbalized understanding of follow-up arrangements: Yes  Individual responsible for coordination of the follow-up plan: [name]/[facility] IN[facility]LAW [phone]  Confirmed correct DME delivered: Elease Hashimoto 11/20/2020    Maddyx Vallie, Gardiner Rhyme

## 2020-11-20 NOTE — Progress Notes (Signed)
PROGRESS NOTE   Subjective/Complaints:  Pt said working on eating this AM- is back on O2-- has an order for continuous.  Pt has eaten at least 10% of plate this AM- has a LOT of food on plate.  Says pain has no improvement. Is still her biggest limiter.     ROS:  Pt denies SOB, (+) abd pain, CP, N/V/C/D, and vision changes   Objective:   No results found. No results for input(s): WBC, HGB, HCT, PLT in the last 72 hours. Recent Labs    11/18/20 0505  CREATININE 0.60    Intake/Output Summary (Last 24 hours) at 11/20/2020 0842 Last data filed at 11/20/2020 0838 Gross per 24 hour  Intake 200 ml  Output --  Net 200 ml       Physical Exam: Vital Signs Blood pressure 100/68, pulse 86, temperature 98.8 F (37.1 C), temperature source Oral, resp. rate 14, height 5\' 4"  (1.626 m), weight 35 kg, SpO2 95 %.        General: awake, alert, appropriate, sitting up with NT's and RN in room; eating some breakfast; NAD HENT: conjugate gaze; oropharynx moist CV: regular rate; no JVD Pulmonary: CTA B/L; no W/R/R- good air movement; has O2 2L by [place] GI: soft, NT, ND, (+)BS- cachetic- abd wound C/D/I- PEG in place- abd binder over it Psychiatric: appropriate; joking some Neurological: alert- less sleepy Musculoskeletal:     Cervical back: Normal range of motion and neck supple.     Comments: No edema or tenderness in extremities  Skin: Has a stage 3- tiny- less than pencil diameter, it's so small- qtip wouldn't fit easily- on coccyx -also has a barely open- very superficial spot on L inner buttock The R lumbar and other spot on coccyx have healed over-  PEG site- burning skin around PEG- due to gastric contents- covered with badage C/D/I- pt didn't want me to assess this AM Neurological:     Mental Status: She is alert.     Comments: Alert and oriented HOH Motor: B/l UE: 4+/5 proximal to distal RLE: 4/5 proximal to  distal LLE: 4/5 HF, KE, 3-/5 ADF     Assessment/Plan: 1. Functional deficits which require 3+ hours per day of interdisciplinary therapy in a comprehensive inpatient rehab setting.  Physiatrist is providing close team supervision and 24 hour management of active medical problems listed below.  Physiatrist and rehab team continue to assess barriers to discharge/monitor patient progress toward functional and medical goals  Care Tool:  Bathing    Body parts bathed by patient: Right arm,Left arm,Chest,Abdomen,Front perineal area,Buttocks,Right upper leg,Left upper leg,Face   Body parts bathed by helper: Right lower leg,Left lower leg     Bathing assist Assist Level: Minimal Assistance - Patient > 75%     Upper Body Dressing/Undressing Upper body dressing   What is the patient wearing?: Pull over shirt    Upper body assist Assist Level: Minimal Assistance - Patient > 75%    Lower Body Dressing/Undressing Lower body dressing      What is the patient wearing?: Incontinence brief,Pants     Lower body assist Assist for lower body dressing: Maximal Assistance - Patient 25 -  49%     Toileting Toileting    Toileting assist Assist for toileting: Moderate Assistance - Patient 50 - 74% Assistive Device Comment: incontinent brief   Transfers Chair/bed transfer  Transfers assist  Chair/bed transfer activity did not occur: Safety/medical concerns  Chair/bed transfer assist level: Minimal Assistance - Patient > 75%     Locomotion Ambulation   Ambulation assist   Ambulation activity did not occur: Safety/medical concerns          Walk 10 feet activity   Assist  Walk 10 feet activity did not occur: Safety/medical concerns        Walk 50 feet activity   Assist Walk 50 feet with 2 turns activity did not occur: Safety/medical concerns         Walk 150 feet activity   Assist Walk 150 feet activity did not occur: Safety/medical concerns          Walk 10 feet on uneven surface  activity   Assist Walk 10 feet on uneven surfaces activity did not occur: Safety/medical concerns         Wheelchair     Assist Will patient use wheelchair at discharge?: Yes (pt will likely require WC at DC however pt unable to tolerate WC transfer or assessment on this date) Type of Wheelchair: Manual Wheelchair activity did not occur: Safety/medical concerns         Wheelchair 50 feet with 2 turns activity    Assist    Wheelchair 50 feet with 2 turns activity did not occur: Safety/medical concerns       Wheelchair 150 feet activity     Assist  Wheelchair 150 feet activity did not occur: Safety/medical concerns       Blood pressure 100/68, pulse 86, temperature 98.8 F (37.1 C), temperature source Oral, resp. rate 14, height 5\' 4"  (1.626 m), weight 35 kg, SpO2 95 %.  Medical Problem List and Plan: 1.  Debility secondary to abdominal wall cellulitis/sepsis complicated by chronic hypoxic respiratory failure/RA             -patient may not shower             -ELOS/Goals: 14-17 days/Min A             will change to 1x/day of PT and OT- and look for SNF bed- since so low function per pt and PT/OT  -con't PT and OT_ might have SNF bed for Monday  2.  Impaired mobility -DVT/anticoagulation: Continue Lovenox             -antiplatelet therapy: N/A 3. Pain Management: Tylenol as needed  4/29- tried tramadol - didn't work- and try Norco 5/325 mg q4 hours prn  4/30: having 10/10 pain with dressing change, continue Norco and give 30 minutes prior to dressing change, Caryl Pina will pass onto oncoming nurse  5/1: AST elevated and pain prohibiting participation with therapy. Switch to oxycodone for better pain control and less tylenol.   5/2- AST is 42- very slightly elevated, likely from frequent tylenol usage- will ask nursing to use Oxy and not tylenol for pain.    5/13- will add MS Contin 15 mg BID= explained could make her  sleepy/sleepier, but the benefit COULD be to improve pain- if gets too sleepy, will stop it. con't 6am Oxycodone and prn 4. Mood: Remeron 15 mg nightly  5/3- will change to 30 mg QHS- is better dose for appetite             -  antipsychotic agents: N/A 5. Neuropsych: This patient is capable of making decisions on her own behalf. 6. Skin/Wound Care: Routine skin checks.  Foam dressing to umbilicus and change every 3 days as needed soiling.  Apply saline moistened 2 x 2 to abdomen wound daily cover with dry 2 x 2 and tape.  Apply dry split thickness gauze underneath G-tube daily.  4/30: reconsulted wound care as PEG site with some leaking TF  5/2- said it's due to fistula- won't follow, but remain available. Also has 3- Stage II small spots on coccyx x2 and 1 on L inner buttock- con't wound care/foam  5/4- will put on air mattress since not moving and has 3 Stage II's-  5/5- spoke to nurse- they will get pt air mattress due to BMI 13- extrmely cachetic and has pressure ulcers x3 Stage II on backside.    5/7- has a tiny stage III and small stage II on L inner buttock- con't air mattress and wound care  5/11- on air mattress- will con't and con't wound care  5/12- Will assess bigger abd wound for possible silver nitrate to be done  5/13- Was able to do without silver nitrate- by unrolling edges- changed by WOC to silver alginate to be changed q3 days- con't regimen 7. Fluids/Electrolytes/Nutrition: Routine in and outs.             CMP ordered 8.  Rheumatoid arthritis.  Continue prednisone. 9.  Chronic hypoxic respiratory failure.  Oxygen therapy as directed continue chronic prednisone.             Monitor O2 sats with increased exertion 10.  Dysphagia.  Dysphagia #1 thin liquids.  G-tube replaced 10/29/2020 per interventional radiology.  Dietary follow-up             Advance diet as tolerated  4/29- PEG- fell out- got back in by radiology-but has high risk of coming out- came out with balloon intact-  con't to monitor   5/5- gastric contents burning skin- WOC consulted- will change orders.  5/8- pt says she's eating, but no improvement in weight- con't to encourage   5/9: PEG fell out- consulting IR regarding replacement.  5/10- to suture PEG in today!   5/11- Will see if sutured in- wasn't in note that was or not.   5/12- isn't sutured in- will ask IR to see if they can help? Maybe suture it in, because notes doesn't mention it.   5/13- NOT able to suture in due to skin burnt by acids of stomach- IR recommends pull through gastrotomy if comes out again.  11.  Thrush/mouth pain/odynophagia.  Follow Dr. Benson Norway  Per pain as above 12. Severe malnutrition  5/10- BMI down to 12.49 since no TFs x2 days- trying to get it back in- due to her being so cachetic.  5/12- BMI 12.11- same- con't to push intake   5/13- BMI jumped today to 13.24- likely false, but will monitor 13. Hypotension-   4/29- BP running 80s/40s-will try Low dose Midodrine- 2.5 mg TID with meals- for lower BP.   5/3- BP 90s/50s- better- no dizziness- con't regimen  5/4 will increase to 5 mg TID with meals- since BP 80/s40s again  5/6- BP much better 117/60s- con't regimen  5/7- maybe not drinking as much- BP 88/52 today- will push fluids- also TFs weren't run full length last night- con't regimen  5/11- stopped IVFs today that were placed due to lack of PEG x 2 days. BP better  109/66 this AM- con't Midodrine.  14. Loose stools: likely 2/2 TF, continue to monitor  5/7- now constipated- LBM 5/3- will add Senokot 1 tab BID and monitor  5/8- add Miralax daily and gave 1 dose Sorbitol- if no improvement, needs KUB tomorrow  5/10- LBM evening of 5/8- large- con't regimen  5/13- LBM 2 days ago- if no BM by tomorrow, needs Sorbitol 15. Iron deficiency: started iron supplement 16. Elevated CBGs: likely secondary to TF- continue to monitor      LOS: 16 days A FACE TO FACE EVALUATION WAS PERFORMED  Tyreck Bell 11/20/2020, 8:42  AM

## 2020-11-20 NOTE — Telephone Encounter (Signed)
Called and spoke with [facility]. She stated that the patient may possibly be going to a skilled nursing facility since it is taking her longer to recover from her surgery. The nursing facility had warned them that they would not be supplying the Ofev due to the cost. They informed the nursing facility that she gets the medication shipped to directly to her and she will be supplying the medication. The facility is ok with this.   I advised her that if they needed a note or anything from our office stating why she needs this medication to let us know. She verbalized understanding.   Nothing further needed at time of call.

## 2020-11-20 NOTE — Progress Notes (Signed)
Occupational Therapy Session Note  Patient Details  Name: NAJLA AUGHENBAUGH MRN: [id number] Date of Birth: 07/21/1930  Today's Date: 11/20/2020 OT Individual Time: 9675-9163 OT Individual Time Calculation (min): 27 min   Short Term Goals: Week 2:  OT Short Term Goal 1 (Week 2): STGs = LTGs d/t ELOS OT Short Term Goal 1 - Progress (Week 2): Progressing toward goal Week 3:  OT Short Term Goal 1 (Week 3): Pt will perform toilet transfers consistently with Min A OT Short Term Goal 2 (Week 3): Pt will improve sitting tolerance to EOB/wheelchair for 10 mins for ADL OT Short Term Goal 3 (Week 3): Pt will perform LB dress in most appropriate environment with Mod A  Skilled Therapeutic Interventions/Progress Updates:    Pt greeted in bed, stating "I feel better today than I've ever felt." Already wearing abdominal binder and motivated to stand. She reported she "almost fell" with staff this morning and wanted to improve standing abilities. Mod A for supine<sit to guide LEs. She completed 5 sit<stands with Min-Mod A using RW, worked on neutral hips in standing as she has posterior lean due in part to FOF. Pt donned pants with overall Mod A sit<stand, Max balance support to prevent posterior LOB while pt was unilaterally engaged in pulling up pants. 02 sats dropped to 83 and 88% post 2/5 stands but rebounded >94% in less than 10 seconds. At end of session pt returned to bed with Min A, able to scoot herself up towards [facility] with supervision. Pt was assisted in sidelying position for pressure relief and left with all needs within reach and 4 bedrails up of air mattress.   Therapy Documentation Precautions:  Precautions Precautions: Fall Precaution Comments: abdominal binder to protect PEG site, oral thrush/mucositis and mouth ulceration, 2L 02 Restrictions Weight Bearing Restrictions: No Vital Signs: Therapy Vitals Temp: 98.9 F (37.2 C) Pulse Rate: 88 Resp: 18 BP: 93/64 Patient Position (if  appropriate): Lying Oxygen Therapy SpO2: 100 % O2 Device: Nasal Cannula O2 Flow Rate (L/min): 2 L/min Pain: pt reported pain to be manageable during session without additional interventions   ADL: ADL Eating: Not assessed Grooming: Setup Where Assessed-Grooming: Edge of bed Upper Body Bathing: Setup Where Assessed-Upper Body Bathing: Edge of bed Lower Body Bathing: Minimal assistance Where Assessed-Lower Body Bathing: Other (Comment) (BSC) Upper Body Dressing: Minimal assistance Where Assessed-Upper Body Dressing: Other (Comment) (sitting on BSC) Lower Body Dressing: Dependent Where Assessed-Lower Body Dressing: Other (Comment) (sit<stand from [facility]) Toileting: Moderate assistance Where Assessed-Toileting: Bedside Commode Toilet Transfer: Moderate assistance Toilet Transfer Method: Stand pivot (without AD) Toilet Transfer Equipment: Bedside commode Tub/Shower Transfer: Not assessed     Therapy/Group: Individual Therapy  Sherlynn Tourville A Nydia Ytuarte 11/20/2020, 3:59 PM

## 2020-11-20 NOTE — Progress Notes (Addendum)
Patient ID: [name] [name], female   DOB: 06-24-1931, 87 y.o.   MRN: [id number] Spoke with [facility] who reports pt can bring her ofev medicine and they can offer a bed. Made [name] aware and will let pt know. Plan for transfer is Monday 5/16. Will need COVID test ordered Sunday so results in for Monday.  11:17 Am Spoke with [name]-sister in-law regarding plan for Monday's transfer. Plan to set up ambulance Monday at 10:00 for transport to [facility]. [name] will call admissions-[facility] to see if paperwork and what she will need to bring for pt on Monday. Dan-PA aware of need for COVID test Sunday.

## 2020-11-21 DIAGNOSIS — M21372 Foot drop, left foot: Secondary | ICD-10-CM

## 2020-11-21 DIAGNOSIS — M21371 Foot drop, right foot: Secondary | ICD-10-CM

## 2020-11-21 LAB — GLUCOSE, CAPILLARY
Glucose-Capillary: 158 mg/dL — ABNORMAL HIGH (ref 70–99)
Glucose-Capillary: 98 mg/dL (ref 70–99)
Glucose-Capillary: 132 mg/dL — ABNORMAL HIGH (ref 70–99)

## 2020-11-21 NOTE — Progress Notes (Addendum)
PROGRESS NOTE   Subjective/Complaints:   Problem with Right foot weakness has noted during long hospitalization   ROS:  Pt denies SOB, (+) abd pain, CP, N/V/C/D, and vision changes   Objective:   No results found. No results for input(s): WBC, HGB, HCT, PLT in the last 72 hours. No results for input(s): NA, K, CL, CO2, GLUCOSE, BUN, CREATININE, CALCIUM in the last 72 hours.  Intake/Output Summary (Last 24 hours) at 11/21/2020 1033 Last data filed at 11/21/2020 0900 Gross per 24 hour  Intake 340 ml  Output --  Net 340 ml       Physical Exam: Vital Signs Blood pressure (!) 106/59, pulse 91, temperature 99.1 F (37.3 C), resp. rate 20, height 5\' 4"  (1.626 m), weight 36 kg, SpO2 96 %.  General: No acute distress Mood and affect are appropriate Heart: Regular rate and rhythm no rubs murmurs or extra sounds Lungs: Clear to auscultation, breathing unlabored, no rales or wheezes Abdomen: Positive bowel sounds, soft nontender to palpation, nondistended Extremities: No clubbing, cyanosis, or edema Skin: No evidence of breakdown, no evidence of rash  Musculoskeletal:     Cervical back: Normal range of motion and neck supple.     Comments: No edema or tenderness in extremities  Skin: Has a stage 3- tiny- less than pencil diameter, it's so small- qtip wouldn't fit easily- on coccyx -also has a barely open- very superficial spot on L inner buttock The R lumbar and other spot on coccyx have healed over-  PEG site- burning skin around PEG- due to gastric contents- covered with badage C/D/I- pt didn't want me to assess this AM Neurological:     Mental Status: She is alert.     Comments: Alert and oriented HOH Motor: B/l UE: 4+/5 proximal to distal RLE: 4/5 proximal to distal 3- ADF  LLE: 4/5 HF, KE, 3-/5 ADF     Assessment/Plan: 1. Functional deficits which require 3+ hours per day of interdisciplinary therapy in a  comprehensive inpatient rehab setting.  Physiatrist is providing close team supervision and 24 hour management of active medical problems listed below.  Physiatrist and rehab team continue to assess barriers to discharge/monitor patient progress toward functional and medical goals  Care Tool:  Bathing    Body parts bathed by patient: Right arm,Left arm,Chest,Abdomen,Front perineal area,Buttocks,Right upper leg,Left upper leg,Face   Body parts bathed by helper: Right lower leg,Left lower leg     Bathing assist Assist Level: Minimal Assistance - Patient > 75%     Upper Body Dressing/Undressing Upper body dressing   What is the patient wearing?: Pull over shirt    Upper body assist Assist Level: Minimal Assistance - Patient > 75%    Lower Body Dressing/Undressing Lower body dressing      What is the patient wearing?: Pants     Lower body assist Assist for lower body dressing: Moderate Assistance - Patient 50 - 74%     Toileting Toileting    Toileting assist Assist for toileting: Moderate Assistance - Patient 50 - 74% Assistive Device Comment: incontinent brief   Transfers Chair/bed transfer  Transfers assist  Chair/bed transfer activity did not occur: Safety/medical concerns  Chair/bed transfer assist level: Minimal Assistance - Patient > 75%     Locomotion Ambulation   Ambulation assist   Ambulation activity did not occur: Safety/medical concerns          Walk 10 feet activity   Assist  Walk 10 feet activity did not occur: Safety/medical concerns        Walk 50 feet activity   Assist Walk 50 feet with 2 turns activity did not occur: Safety/medical concerns         Walk 150 feet activity   Assist Walk 150 feet activity did not occur: Safety/medical concerns         Walk 10 feet on uneven surface  activity   Assist Walk 10 feet on uneven surfaces activity did not occur: Safety/medical concerns          Wheelchair     Assist Will patient use wheelchair at discharge?: Yes (pt will likely require WC at DC however pt unable to tolerate WC transfer or assessment on this date) Type of Wheelchair: Manual Wheelchair activity did not occur: Safety/medical concerns         Wheelchair 50 feet with 2 turns activity    Assist    Wheelchair 50 feet with 2 turns activity did not occur: Safety/medical concerns       Wheelchair 150 feet activity     Assist  Wheelchair 150 feet activity did not occur: Safety/medical concerns       Blood pressure (!) 106/59, pulse 91, temperature 99.1 F (37.3 C), resp. rate 20, height 5\' 4"  (1.626 m), weight 36 kg, SpO2 96 %.  Medical Problem List and Plan: 1.  Debility secondary to abdominal wall cellulitis/sepsis complicated by chronic hypoxic respiratory failure/RA             -patient may not shower             -ELOS/Goals: 14-17 days/Min A             will change to 1x/day of PT and OT- and look for SNF bed- since so low function per pt and PT/OT  -con't PT and OT_ might have SNF bed for Monday  2.  Impaired mobility -DVT/anticoagulation: Continue Lovenox             -antiplatelet therapy: N/A 3. Pain Management: Tylenol as needed  4/29- tried tramadol - didn't work- and try Norco 5/325 mg q4 hours prn  4/30: having 10/10 pain with dressing change, continue Norco and give 30 minutes prior to dressing change, Caryl Pina will pass onto oncoming nurse  5/1: AST elevated and pain prohibiting participation with therapy. Switch to oxycodone for better pain control and less tylenol.   5/2- AST is 42- very slightly elevated, likely from frequent tylenol usage- will ask nursing to use Oxy and not tylenol for pain.    5/13- will add MS Contin 15 mg BID= explained could make her sleepy/sleepier, but the benefit COULD be to improve pain- if gets too sleepy, will stop it. con't 6am Oxycodone and prn 4. Mood: Remeron 15 mg nightly  5/3- will change to 30  mg QHS- is better dose for appetite             -antipsychotic agents: N/A 5. Neuropsych: This patient is capable of making decisions on her own behalf. 6. Skin/Wound Care: Routine skin checks.  Foam dressing to umbilicus and change every 3 days as needed soiling.  Apply saline moistened 2 x 2 to abdomen wound  daily cover with dry 2 x 2 and tape.  Apply dry split thickness gauze underneath G-tube daily.  4/30: reconsulted wound care as PEG site with some leaking TF  5/2- said it's due to fistula- won't follow, but remain available. Also has 3- Stage II small spots on coccyx x2 and 1 on L inner buttock- con't wound care/foam  5/4- will put on air mattress since not moving and has 3 Stage II's-  5/5- spoke to nurse- they will get pt air mattress due to BMI 13- extrmely cachetic and has pressure ulcers x3 Stage II on backside.    5/7- has a tiny stage III and small stage II on L inner buttock- con't air mattress and wound care  5/11- on air mattress- will con't and con't wound care  5/12- Will assess bigger abd wound for possible silver nitrate to be done  5/13- Was able to do without silver nitrate- by unrolling edges- changed by WOC to silver alginate to be changed q3 days- con't regimen 7. Fluids/Electrolytes/Nutrition: Routine in and outs.             CMP ordered 8.  Rheumatoid arthritis.  Continue prednisone. 9.  Chronic hypoxic respiratory failure.  Oxygen therapy as directed continue chronic prednisone.             Monitor O2 sats with increased exertion 10.  Dysphagia.  Dysphagia #1 thin liquids.  G-tube replaced 10/29/2020 per interventional radiology.  Dietary follow-up             Advance diet as tolerated  4/29- PEG- fell out- got back in by radiology-but has high risk of coming out- came out with balloon intact- con't to monitor   5/5- gastric contents burning skin- WOC consulted- will change orders.  5/8- pt says she's eating, but no improvement in weight- con't to encourage   5/9:  PEG fell out- consulting IR regarding replacement.  5/10- to suture PEG in today!   5/11- Will see if sutured in- wasn't in note that was or not.   5/12- isn't sutured in- will ask IR to see if they can help? Maybe suture it in, because notes doesn't mention it.   5/13- NOT able to suture in due to skin burnt by acids of stomach- IR recommends pull through gastrotomy if comes out again.  11.  Thrush/mouth pain/odynophagia.  Follow Dr. Benson Norway  Per pain as above 12. Severe malnutrition  5/10- BMI down to 12.49 since no TFs x2 days- trying to get it back in- due to her being so cachetic.  5/12- BMI 12.11- same- con't to push intake   5/13- BMI jumped today to 13.24- likely false, but will monitor 13. Hypotension-   4/29- BP running 80s/40s-will try Low dose Midodrine- 2.5 mg TID with meals- for lower BP.   5/3- BP 90s/50s- better- no dizziness- con't regimen  5/4 will increase to 5 mg TID with meals- since BP 80/s40s again  5/6- BP much better 117/60s- con't regimen  5/7- maybe not drinking as much- BP 88/52 today- will push fluids- also TFs weren't run full length last night- con't regimen  5/11- stopped IVFs today that were placed due to lack of PEG x 2 days. BP better 109/66 this AM- con't Midodrine.  14. Loose stools: likely 2/2 TF, continue to monitor  5/7- now constipated- LBM 5/3- will add Senokot 1 tab BID and monitor  5/8- add Miralax daily and gave 1 dose Sorbitol- if no improvement, needs KUB tomorrow  5/10- LBM evening of 5/8- large- con't regimen  5/13- LBM 2 days ago- if no BM by tomorrow, needs Sorbitol 15. Iron deficiency: started iron supplement 16. Elevated CBGs: likely secondary to TF- continue to monitor   17.  B foot drop likely neuropathy no back pain will order Prevalon boots at noc   LOS: 17 days A FACE TO Murray E Carlos Heber 11/21/2020, 10:33 AM

## 2020-11-21 NOTE — Plan of Care (Signed)
  Problem: Consults Goal: RH GENERAL PATIENT EDUCATION Description: See Patient Education module for education specifics. Outcome: Progressing Goal: Skin Care Protocol Initiated - if Braden Score 18 or less Description: If consults are not indicated, leave blank or document N/A Outcome: Progressing Goal: Diabetes Guidelines if Diabetic/Glucose > 140 Description: If diabetic or lab glucose is > 140 mg/dl - Initiate Diabetes/Hyperglycemia Guidelines & Document Interventions  Outcome: Progressing   Problem: RH BLADDER ELIMINATION Goal: RH STG MANAGE BLADDER WITH ASSISTANCE Description: STG Manage Bladder With Max Assistance Outcome: Progressing   Problem: RH SKIN INTEGRITY Goal: RH STG SKIN FREE OF INFECTION/BREAKDOWN Description: Skin to remain free from additional breakdown while on rehab with min assist. Outcome: Progressing Goal: RH STG MAINTAIN SKIN INTEGRITY WITH ASSISTANCE Description: STG Maintain Skin Integrity With [place]. Outcome: Progressing Goal: RH STG ABLE TO PERFORM INCISION/WOUND CARE W/ASSISTANCE Description: STG Able To Perform Incision/Wound Care With total Assistance. Outcome: Progressing   Problem: RH SAFETY Goal: RH STG ADHERE TO SAFETY PRECAUTIONS W/ASSISTANCE/DEVICE Description: STG Adhere to Safety Precautions With Min Assistance/Device. Outcome: Progressing   Problem: RH PAIN MANAGEMENT Goal: RH STG PAIN MANAGED AT OR BELOW PT'S PAIN GOAL Description: < 4 on a 0-10 pain scale. Outcome: Progressing   Problem: RH KNOWLEDGE DEFICIT GENERAL Goal: RH STG INCREASE KNOWLEDGE OF SELF CARE AFTER HOSPITALIZATION Description: Patient will be able to demonstrate knowledge of medication management, diabetes management, wound care management, bowel/bladder management will educational materials and handouts provided by staff, at discharge independently. Outcome: Progressing

## 2020-11-21 NOTE — Progress Notes (Signed)
Very poor appetite. Only drinking sips of resource breeze. Refused ensure. Patient very drowsy this shift. Dressings change around peg site and abdominal wound. Peg site dressing saturated with yellow/tan drainage. Area reddened/painful. Triple paste, aquacel and split gauze applied. Abdominal binder in place. Osmolite running this evening at 70cc/hr.

## 2020-11-21 NOTE — Progress Notes (Signed)
Occupational Therapy Session Note  Patient Details  Name: [name] [name] MRN: [id number] Date of Birth: 19-Jun-1931  Today's Date: 11/21/2020 OT Individual Time: 0525-9102 OT Individual Time Calculation (min): 39 min    Short Term Goals: Week 3:  OT Short Term Goal 1 (Week 3): Pt will perform toilet transfers consistently with Min A OT Short Term Goal 2 (Week 3): Pt will improve sitting tolerance to EOB/wheelchair for 10 mins for ADL OT Short Term Goal 3 (Week 3): Pt will perform LB dress in most appropriate environment with Mod A  Skilled Therapeutic Interventions/Progress Updates:    Pt received supine with no c/o pain, difficult to understand at times. Pt required increased time for initiation of tasks. She completed bed mobility to EOB with min A. Stand pivot transfer to [facility] with mod A. Pt required max A for clothing management. While on [facility] pt completed grooming tasks with set up assist and cueing for initiation. Pt on 2L [place] once OOB. Pt began c/o nausea and dizziness. Began to assess vitals but pt reporting it was getting worse and she was close to being sick so quickly initiated transfer back to bed instead in case of hypotension. She stood and required max A for peri hygiene and donning of new brief. Min A to return supine in bed. BP once supine 126/79. Pt finished grooming tasks supine with set up assist, as well as oral care with medicated rinse. Pt was left supine with all needs met.   Therapy Documentation Precautions:  Precautions Precautions: Fall Precaution Comments: abdominal binder to protect PEG site, oral thrush/mucositis and mouth ulceration, 2L 02 Restrictions Weight Bearing Restrictions: No  Therapy/Group: Individual Therapy  Curtis Sites 11/21/2020, 6:38 AM

## 2020-11-21 NOTE — Progress Notes (Addendum)
Occupational Therapy Session Note  Patient Details  Name: [name] [name] MRN: [id number] Date of Birth: Mar 26, 1931  Today's Date: 11/21/2020 OT Individual Time: 1450-1513 OT Individual Time Calculation (min): 23 min  52 minutes missed  Short Term Goals: Week 1:  OT Short Term Goal 1 (Week 1): Pt will complete LB dressing with Mod A OT Short Term Goal 1 - Progress (Week 1): Progressing toward goal OT Short Term Goal 2 (Week 1): Pt will complete 1 grooming task while standing at the sink to increase standing endurance OT Short Term Goal 2 - Progress (Week 1): Not met OT Short Term Goal 3 (Week 1): Pt will complete sit<stand during toileting with Min A and LRAD OT Short Term Goal 3 - Progress (Week 1): Progressing toward goal  Skilled Therapeutic Interventions/Progress Updates:    Pt greeted in bed, denying pain, stating she didn't want to participate in tx because she didn't "feel like it." Tried to inquire pt about this however she was minimally conversive. Pt said she felt fine, just didn't want to participate. Education provided on her OT goals, rehab process, and POC with pt still declining OOB/bedlevel activity. Brought in music to play on a speaker, tried to engage pt for 2 songs, encouragement to move either UE and tap toes in beat to music. Pt at first willing to participate but then very flat affect returned. She was once again adamant about declining participation. Tx time missed due to refusal. RN made aware.   Therapy Documentation Precautions:  Precautions Precautions: Fall Precaution Comments: abdominal binder to protect PEG site, oral thrush/mucositis and mouth ulceration, 2L 02 Restrictions Weight Bearing Restrictions: No Vital Signs: Therapy Vitals Temp: 98.5 F (36.9 C) Pulse Rate: 81 Resp: 18 BP: 98/63 Patient Position (if appropriate): Lying Oxygen Therapy SpO2: 99 % O2 Device: Nasal Cannula    ADL: ADL Eating: Not assessed Grooming: Setup Where  Assessed-Grooming: Edge of bed Upper Body Bathing: Setup Where Assessed-Upper Body Bathing: Edge of bed Lower Body Bathing: Minimal assistance Where Assessed-Lower Body Bathing: Other (Comment) (BSC) Upper Body Dressing: Minimal assistance Where Assessed-Upper Body Dressing: Other (Comment) (sitting on BSC) Lower Body Dressing: Dependent Where Assessed-Lower Body Dressing: Other (Comment) (sit<stand from [facility]) Toileting: Moderate assistance Where Assessed-Toileting: Bedside Commode Toilet Transfer: Moderate assistance Toilet Transfer Method: Stand pivot (without AD) Toilet Transfer Equipment: Bedside commode Tub/Shower Transfer: Not assessed      Therapy/Group: Individual Therapy  Kashawn Dirr A Sylvi Rybolt 11/21/2020, 3:22 PM

## 2020-11-22 LAB — SARS CORONAVIRUS 2 (TAT 6-24 HRS): SARS Coronavirus 2: NEGATIVE

## 2020-11-22 LAB — GLUCOSE, CAPILLARY
Glucose-Capillary: 116 mg/dL — ABNORMAL HIGH (ref 70–99)
Glucose-Capillary: 133 mg/dL — ABNORMAL HIGH (ref 70–99)

## 2020-11-22 MED ORDER — OSMOLITE 1.5 CAL PO LIQD: 1000.0000 mL | ORAL | Status: DC

## 2020-11-22 NOTE — Progress Notes (Signed)
PROGRESS NOTE   Subjective/Complaints:  No c/os today , pt is doing ok, discussed PEG site care with LPN  ROS:  Pt denies SOB, (+) abd pain, CP, N/V/C/D, and vision changes   Objective:   No results found. No results for input(s): WBC, HGB, HCT, PLT in the last 72 hours. No results for input(s): NA, K, CL, CO2, GLUCOSE, BUN, CREATININE, CALCIUM in the last 72 hours.  Intake/Output Summary (Last 24 hours) at 11/22/2020 1020 Last data filed at 11/22/2020 0900 Gross per 24 hour  Intake 280 ml  Output --  Net 280 ml       Physical Exam: Vital Signs Blood pressure 104/66, pulse 72, temperature 97.9 F (36.6 C), temperature source Oral, resp. rate 16, height 5\' 4"  (1.626 m), weight 35 kg, SpO2 99 %.   General: No acute distress Mood and affect are appropriate Heart: Regular rate and rhythm no rubs murmurs or extra sounds Lungs: Clear to auscultation, breathing unlabored, no rales or wheezes Abdomen: Positive bowel sounds, soft nontender to palpation, nondistended PEG site with aquacel against skin an dsplit gauze sponge on top which is wet with serous fluid Extremities: No clubbing, cyanosis, or edema Skin: No evidence of breakdown, no evidence of rash    Musculoskeletal:     Cervical back: Normal range of motion and neck supple.     Comments: No edema or tenderness in extremities  Skin: Has a stage 3- tiny- less than pencil diameter, it's so small- qtip wouldn't fit easily- on coccyx -also has a barely open- very superficial spot on L inner buttock The R lumbar and other spot on coccyx have healed over-   Neurological:     Mental Status: She is alert.     Comments: Alert and oriented HOH Motor: B/l UE: 4+/5 proximal to distal RLE: 4/5 proximal to distal 3- ADF  LLE: 4/5 HF, KE, 3-/5 ADF     Assessment/Plan: 1. Functional deficits which require 3+ hours per day of interdisciplinary therapy in a  comprehensive inpatient rehab setting.  Physiatrist is providing close team supervision and 24 hour management of active medical problems listed below.  Physiatrist and rehab team continue to assess barriers to discharge/monitor patient progress toward functional and medical goals  Care Tool:  Bathing    Body parts bathed by patient: Right arm,Left arm,Chest,Abdomen,Front perineal area,Buttocks,Right upper leg,Left upper leg,Face   Body parts bathed by helper: Right lower leg,Left lower leg     Bathing assist Assist Level: Minimal Assistance - Patient > 75%     Upper Body Dressing/Undressing Upper body dressing   What is the patient wearing?: Pull over shirt    Upper body assist Assist Level: Minimal Assistance - Patient > 75%    Lower Body Dressing/Undressing Lower body dressing      What is the patient wearing?: Pants     Lower body assist Assist for lower body dressing: Moderate Assistance - Patient 50 - 74%     Toileting Toileting    Toileting assist Assist for toileting: Moderate Assistance - Patient 50 - 74% Assistive Device Comment: incontinent brief   Transfers Chair/bed transfer  Transfers assist  Chair/bed transfer activity did  not occur: Safety/medical concerns  Chair/bed transfer assist level: Minimal Assistance - Patient > 75%     Locomotion Ambulation   Ambulation assist   Ambulation activity did not occur: Safety/medical concerns          Walk 10 feet activity   Assist  Walk 10 feet activity did not occur: Safety/medical concerns        Walk 50 feet activity   Assist Walk 50 feet with 2 turns activity did not occur: Safety/medical concerns         Walk 150 feet activity   Assist Walk 150 feet activity did not occur: Safety/medical concerns         Walk 10 feet on uneven surface  activity   Assist Walk 10 feet on uneven surfaces activity did not occur: Safety/medical concerns          Wheelchair     Assist Will patient use wheelchair at discharge?: Yes (pt will likely require WC at DC however pt unable to tolerate WC transfer or assessment on this date) Type of Wheelchair: Manual Wheelchair activity did not occur: Safety/medical concerns         Wheelchair 50 feet with 2 turns activity    Assist    Wheelchair 50 feet with 2 turns activity did not occur: Safety/medical concerns       Wheelchair 150 feet activity     Assist  Wheelchair 150 feet activity did not occur: Safety/medical concerns       Blood pressure 104/66, pulse 72, temperature 97.9 F (36.6 C), temperature source Oral, resp. rate 16, height 5\' 4"  (1.626 m), weight 35 kg, SpO2 99 %.  Medical Problem List and Plan: 1.  Debility secondary to abdominal wall cellulitis/sepsis complicated by chronic hypoxic respiratory failure/RA             -patient may not shower             -ELOS/Goals: 14-17 days/Min A             will change to 1x/day of PT and OT- and look for SNF bed- since so low function per pt and PT/OT  -con't PT and OT_ might have SNF bed for Monday - stable for D/C to SNF in am if bed available  2.  Impaired mobility -DVT/anticoagulation: Continue Lovenox             -antiplatelet therapy: N/A 3. Pain Management: Tylenol as needed  4/29- tried tramadol - didn't work- and try Norco 5/325 mg q4 hours prn  4/30: having 10/10 pain with dressing change, continue Norco and give 30 minutes prior to dressing change, Caryl Pina will pass onto oncoming nurse  5/1: AST elevated and pain prohibiting participation with therapy. Switch to oxycodone for better pain control and less tylenol.   5/2- AST is 42- very slightly elevated, likely from frequent tylenol usage- will ask nursing to use Oxy and not tylenol for pain.    5/13- will add MS Contin 15 mg BID= explained could make her sleepy/sleepier, but the benefit COULD be to improve pain- if gets too sleepy, will stop it. con't 6am  Oxycodone and prn 4. Mood: Remeron 15 mg nightly  5/3- will change to 30 mg QHS- is better dose for appetite             -antipsychotic agents: N/A 5. Neuropsych: This patient is capable of making decisions on her own behalf. 6. Skin/Wound Care: Routine skin checks.  Foam dressing to umbilicus  and change every 3 days as needed soiling.  Apply saline moistened 2 x 2 to abdomen wound daily cover with dry 2 x 2 and tape.  Apply dry split thickness gauze underneath G-tube daily.  4/30: reconsulted wound care as PEG site with some leaking TF  5/2- said it's due to fistula- won't follow, but remain available. Also has 3- Stage II small spots on coccyx x2 and 1 on L inner buttock- con't wound care/foam  5/4- will put on air mattress since not moving and has 3 Stage II's-  5/5- spoke to nurse- they will get pt air mattress due to BMI 13- extrmely cachetic and has pressure ulcers x3 Stage II on backside.    5/7- has a tiny stage III and small stage II on L inner buttock- con't air mattress and wound care  5/11- on air mattress- will con't and con't wound care  5/12- Will assess bigger abd wound for possible silver nitrate to be done  5/13- Was able to do without silver nitrate- by unrolling edges- changed by WOC to silver alginate to be changed q3 days- con't regimen 7. Fluids/Electrolytes/Nutrition: Routine in and outs.             CMP ordered 8.  Rheumatoid arthritis.  Continue prednisone. 9.  Chronic hypoxic respiratory failure.  Oxygen therapy as directed continue chronic prednisone.             Monitor O2 sats with increased exertion 10.  Dysphagia.  Dysphagia #1 thin liquids.  G-tube replaced 10/29/2020 per interventional radiology.  Dietary follow-up             Advance diet as tolerated  4/29- PEG- fell out- got back in by radiology-but has high risk of coming out- came out with balloon intact- con't to monitor   5/5- gastric contents burning skin- WOC consulted- will change orders.  5/8- pt  says she's eating, but no improvement in weight- con't to encourage   5/9: PEG fell out- consulting IR regarding replacement.  5/10- to suture PEG in today!   5/11- Will see if sutured in- wasn't in note that was or not.   5/12- isn't sutured in- will ask IR to see if they can help? Maybe suture it in, because notes doesn't mention it.   5/13- NOT able to suture in due to skin burnt by acids of stomach- IR recommends pull through gastrotomy if comes out again.  11.  Thrush/mouth pain/odynophagia.  Follow Dr. Benson Norway  Per pain as above 12. Severe malnutrition  5/10- BMI down to 12.49 since no TFs x2 days- trying to get it back in- due to her being so cachetic.  5/12- BMI 12.11- same- con't to push intake   5/13- BMI jumped today to 13.24- likely false, but will monitor 13. Hypotension-   4/29- BP running 80s/40s-will try Low dose Midodrine- 2.5 mg TID with meals- for lower BP.   5/3- BP 90s/50s- better- no dizziness- con't regimen  5/4 will increase to 5 mg TID with meals- since BP 80/s40s again  5/6- BP much better 117/60s- con't regimen  5/7- maybe not drinking as much- BP 88/52 today- will push fluids- also TFs weren't run full length last night- con't regimen  5/11- stopped IVFs today that were placed due to lack of PEG x 2 days. BP better 109/66 this AM- con't Midodrine.  14. Loose stools: likely 2/2 TF, continue to monitor  5/7- now constipated- LBM 5/3- will add Senokot 1 tab BID  and monitor  5/8- add Miralax daily and gave 1 dose Sorbitol- if no improvement, needs KUB tomorrow  5/10- LBM evening of 5/8- large- con't regimen  5/13- had BM 15. Iron deficiency: started iron supplement 16. Elevated CBGs: likely secondary to TF- continue to monitor   17.  B foot drop likely neuropathy no back pain will order Prevalon boots at noc   LOS: 18 days A FACE TO [place] E Diavian Furgason 11/22/2020, 10:20 AM

## 2020-11-22 NOTE — Progress Notes (Signed)
Occupational Therapy Session Note  Patient Details  Name: [name] [name] MRN: [id number] Date of Birth: 07/17/1930  Today's Date: 11/22/2020 OT Missed Time: 30 Minutes Missed Time Reason: Patient unwilling/refused to participate without medical reason   Short Term Goals: Week 1:  OT Short Term Goal 1 (Week 1): Pt will complete LB dressing with Mod A OT Short Term Goal 1 - Progress (Week 1): Progressing toward goal OT Short Term Goal 2 (Week 1): Pt will complete 1 grooming task while standing at the sink to increase standing endurance OT Short Term Goal 2 - Progress (Week 1): Not met OT Short Term Goal 3 (Week 1): Pt will complete sit<stand during toileting with Min A and LRAD OT Short Term Goal 3 - Progress (Week 1): Progressing toward goal Week 2:  OT Short Term Goal 1 (Week 2): STGs = LTGs d/t ELOS OT Short Term Goal 1 - Progress (Week 2): Progressing toward goal Week 3:  OT Short Term Goal 1 (Week 3): Pt will perform toilet transfers consistently with Min A OT Short Term Goal 2 (Week 3): Pt will improve sitting tolerance to EOB/wheelchair for 10 mins for ADL OT Short Term Goal 3 (Week 3): Pt will perform LB dress in most appropriate environment with Mod A  Skilled Therapeutic Interventions/Progress Updates:    Pt greeted at time of scheduled therapy session with pt reclined in bed resting on supplemental O2, politely but adamantly declining therapy at this time declining sitting EOB, getting OOB, ADL, or simply washing face. Pt saying " I know I need to but I just dont feel up to it today." Call bell in reach all needs met. Missed 30 mins of OT.  Therapy Documentation Precautions:  Precautions Precautions: Fall Precaution Comments: abdominal binder to protect PEG site, oral thrush/mucositis and mouth ulceration, 2L 02 Restrictions Weight Bearing Restrictions: No    Therapy/Group: Individual Therapy   C  11/22/2020, 7:16 AM  

## 2020-11-23 DIAGNOSIS — Z7401 Bed confinement status: Secondary | ICD-10-CM | POA: Diagnosis not present

## 2020-11-23 DIAGNOSIS — G894 Chronic pain syndrome: Secondary | ICD-10-CM | POA: Diagnosis not present

## 2020-11-23 DIAGNOSIS — R633 Feeding difficulties, unspecified: Secondary | ICD-10-CM | POA: Diagnosis not present

## 2020-11-23 DIAGNOSIS — R278 Other lack of coordination: Secondary | ICD-10-CM | POA: Diagnosis not present

## 2020-11-23 DIAGNOSIS — R0902 Hypoxemia: Secondary | ICD-10-CM | POA: Diagnosis not present

## 2020-11-23 DIAGNOSIS — D649 Anemia, unspecified: Secondary | ICD-10-CM | POA: Diagnosis not present

## 2020-11-23 DIAGNOSIS — R0602 Shortness of breath: Secondary | ICD-10-CM | POA: Diagnosis not present

## 2020-11-23 DIAGNOSIS — N3000 Acute cystitis without hematuria: Secondary | ICD-10-CM | POA: Diagnosis not present

## 2020-11-23 DIAGNOSIS — E1165 Type 2 diabetes mellitus with hyperglycemia: Secondary | ICD-10-CM | POA: Diagnosis not present

## 2020-11-23 DIAGNOSIS — J189 Pneumonia, unspecified organism: Secondary | ICD-10-CM | POA: Diagnosis not present

## 2020-11-23 DIAGNOSIS — I10 Essential (primary) hypertension: Secondary | ICD-10-CM | POA: Diagnosis not present

## 2020-11-23 DIAGNOSIS — Z431 Encounter for attention to gastrostomy: Secondary | ICD-10-CM | POA: Diagnosis not present

## 2020-11-23 DIAGNOSIS — R4189 Other symptoms and signs involving cognitive functions and awareness: Secondary | ICD-10-CM | POA: Diagnosis not present

## 2020-11-23 DIAGNOSIS — Z9861 Coronary angioplasty status: Secondary | ICD-10-CM | POA: Diagnosis not present

## 2020-11-23 DIAGNOSIS — R404 Transient alteration of awareness: Secondary | ICD-10-CM | POA: Diagnosis not present

## 2020-11-23 DIAGNOSIS — E782 Mixed hyperlipidemia: Secondary | ICD-10-CM | POA: Diagnosis not present

## 2020-11-23 DIAGNOSIS — J9610 Chronic respiratory failure, unspecified whether with hypoxia or hypercapnia: Secondary | ICD-10-CM | POA: Diagnosis not present

## 2020-11-23 DIAGNOSIS — K9423 Gastrostomy malfunction: Secondary | ICD-10-CM | POA: Diagnosis not present

## 2020-11-23 DIAGNOSIS — Z96653 Presence of artificial knee joint, bilateral: Secondary | ICD-10-CM | POA: Diagnosis not present

## 2020-11-23 DIAGNOSIS — K56699 Other intestinal obstruction unspecified as to partial versus complete obstruction: Secondary | ICD-10-CM | POA: Diagnosis not present

## 2020-11-23 DIAGNOSIS — E785 Hyperlipidemia, unspecified: Secondary | ICD-10-CM | POA: Diagnosis not present

## 2020-11-23 DIAGNOSIS — U099 Post covid-19 condition, unspecified: Secondary | ICD-10-CM | POA: Diagnosis not present

## 2020-11-23 DIAGNOSIS — M6281 Muscle weakness (generalized): Secondary | ICD-10-CM | POA: Diagnosis not present

## 2020-11-23 DIAGNOSIS — R296 Repeated falls: Secondary | ICD-10-CM | POA: Diagnosis not present

## 2020-11-23 DIAGNOSIS — R4182 Altered mental status, unspecified: Secondary | ICD-10-CM | POA: Diagnosis not present

## 2020-11-23 DIAGNOSIS — R400 Somnolence: Secondary | ICD-10-CM | POA: Diagnosis not present

## 2020-11-23 DIAGNOSIS — J9621 Acute and chronic respiratory failure with hypoxia: Secondary | ICD-10-CM | POA: Diagnosis not present

## 2020-11-23 DIAGNOSIS — R001 Bradycardia, unspecified: Secondary | ICD-10-CM | POA: Diagnosis not present

## 2020-11-23 DIAGNOSIS — Z86711 Personal history of pulmonary embolism: Secondary | ICD-10-CM | POA: Diagnosis not present

## 2020-11-23 DIAGNOSIS — J849 Interstitial pulmonary disease, unspecified: Secondary | ICD-10-CM | POA: Diagnosis not present

## 2020-11-23 DIAGNOSIS — U071 COVID-19: Secondary | ICD-10-CM | POA: Diagnosis not present

## 2020-11-23 DIAGNOSIS — R410 Disorientation, unspecified: Secondary | ICD-10-CM | POA: Diagnosis not present

## 2020-11-23 DIAGNOSIS — R279 Unspecified lack of coordination: Secondary | ICD-10-CM | POA: Diagnosis not present

## 2020-11-23 DIAGNOSIS — Z86718 Personal history of other venous thrombosis and embolism: Secondary | ICD-10-CM | POA: Diagnosis not present

## 2020-11-23 DIAGNOSIS — R109 Unspecified abdominal pain: Secondary | ICD-10-CM | POA: Diagnosis not present

## 2020-11-23 DIAGNOSIS — J8489 Other specified interstitial pulmonary diseases: Secondary | ICD-10-CM | POA: Diagnosis not present

## 2020-11-23 DIAGNOSIS — R2681 Unsteadiness on feet: Secondary | ICD-10-CM | POA: Diagnosis not present

## 2020-11-23 DIAGNOSIS — R1084 Generalized abdominal pain: Secondary | ICD-10-CM | POA: Diagnosis not present

## 2020-11-23 DIAGNOSIS — L03311 Cellulitis of abdominal wall: Secondary | ICD-10-CM | POA: Diagnosis not present

## 2020-11-23 DIAGNOSIS — E640 Sequelae of protein-calorie malnutrition: Secondary | ICD-10-CM | POA: Diagnosis not present

## 2020-11-23 DIAGNOSIS — R64 Cachexia: Secondary | ICD-10-CM | POA: Diagnosis not present

## 2020-11-23 DIAGNOSIS — S31105D Unspecified open wound of abdominal wall, periumbilic region without penetration into peritoneal cavity, subsequent encounter: Secondary | ICD-10-CM | POA: Diagnosis not present

## 2020-11-23 DIAGNOSIS — M0689 Other specified rheumatoid arthritis, multiple sites: Secondary | ICD-10-CM | POA: Diagnosis not present

## 2020-11-23 DIAGNOSIS — I959 Hypotension, unspecified: Secondary | ICD-10-CM | POA: Diagnosis not present

## 2020-11-23 DIAGNOSIS — K219 Gastro-esophageal reflux disease without esophagitis: Secondary | ICD-10-CM | POA: Diagnosis not present

## 2020-11-23 DIAGNOSIS — J8410 Pulmonary fibrosis, unspecified: Secondary | ICD-10-CM | POA: Diagnosis not present

## 2020-11-23 DIAGNOSIS — I5032 Chronic diastolic (congestive) heart failure: Secondary | ICD-10-CM | POA: Diagnosis not present

## 2020-11-23 DIAGNOSIS — Z4682 Encounter for fitting and adjustment of non-vascular catheter: Secondary | ICD-10-CM | POA: Diagnosis not present

## 2020-11-23 DIAGNOSIS — J1281 Pneumonia due to SARS-associated coronavirus: Secondary | ICD-10-CM | POA: Diagnosis not present

## 2020-11-23 DIAGNOSIS — R0789 Other chest pain: Secondary | ICD-10-CM | POA: Diagnosis not present

## 2020-11-23 DIAGNOSIS — J9600 Acute respiratory failure, unspecified whether with hypoxia or hypercapnia: Secondary | ICD-10-CM | POA: Diagnosis not present

## 2020-11-23 DIAGNOSIS — R5381 Other malaise: Secondary | ICD-10-CM | POA: Diagnosis not present

## 2020-11-23 DIAGNOSIS — L89152 Pressure ulcer of sacral region, stage 2: Secondary | ICD-10-CM | POA: Diagnosis not present

## 2020-11-23 DIAGNOSIS — K9420 Gastrostomy complication, unspecified: Secondary | ICD-10-CM | POA: Diagnosis not present

## 2020-11-23 DIAGNOSIS — I9589 Other hypotension: Secondary | ICD-10-CM | POA: Diagnosis not present

## 2020-11-23 DIAGNOSIS — I25118 Atherosclerotic heart disease of native coronary artery with other forms of angina pectoris: Secondary | ICD-10-CM | POA: Diagnosis not present

## 2020-11-23 DIAGNOSIS — E43 Unspecified severe protein-calorie malnutrition: Secondary | ICD-10-CM | POA: Diagnosis not present

## 2020-11-23 DIAGNOSIS — E46 Unspecified protein-calorie malnutrition: Secondary | ICD-10-CM | POA: Diagnosis not present

## 2020-11-23 DIAGNOSIS — L7682 Other postprocedural complications of skin and subcutaneous tissue: Secondary | ICD-10-CM | POA: Diagnosis not present

## 2020-11-23 DIAGNOSIS — Z87891 Personal history of nicotine dependence: Secondary | ICD-10-CM | POA: Diagnosis not present

## 2020-11-23 DIAGNOSIS — T8189XA Other complications of procedures, not elsewhere classified, initial encounter: Secondary | ICD-10-CM | POA: Diagnosis not present

## 2020-11-23 DIAGNOSIS — I209 Angina pectoris, unspecified: Secondary | ICD-10-CM | POA: Diagnosis not present

## 2020-11-23 DIAGNOSIS — R531 Weakness: Secondary | ICD-10-CM | POA: Diagnosis not present

## 2020-11-23 DIAGNOSIS — I952 Hypotension due to drugs: Secondary | ICD-10-CM | POA: Diagnosis not present

## 2020-11-23 DIAGNOSIS — R52 Pain, unspecified: Secondary | ICD-10-CM | POA: Diagnosis not present

## 2020-11-23 DIAGNOSIS — K5901 Slow transit constipation: Secondary | ICD-10-CM | POA: Diagnosis not present

## 2020-11-23 DIAGNOSIS — I2510 Atherosclerotic heart disease of native coronary artery without angina pectoris: Secondary | ICD-10-CM | POA: Diagnosis not present

## 2020-11-23 DIAGNOSIS — R627 Adult failure to thrive: Secondary | ICD-10-CM | POA: Diagnosis not present

## 2020-11-23 DIAGNOSIS — R079 Chest pain, unspecified: Secondary | ICD-10-CM | POA: Diagnosis not present

## 2020-11-23 DIAGNOSIS — T8131XS Disruption of external operation (surgical) wound, not elsewhere classified, sequela: Secondary | ICD-10-CM | POA: Diagnosis not present

## 2020-11-23 DIAGNOSIS — T8131XD Disruption of external operation (surgical) wound, not elsewhere classified, subsequent encounter: Secondary | ICD-10-CM | POA: Diagnosis not present

## 2020-11-23 DIAGNOSIS — I110 Hypertensive heart disease with heart failure: Secondary | ICD-10-CM | POA: Diagnosis not present

## 2020-11-23 DIAGNOSIS — Z515 Encounter for palliative care: Secondary | ICD-10-CM | POA: Diagnosis not present

## 2020-11-23 DIAGNOSIS — R112 Nausea with vomiting, unspecified: Secondary | ICD-10-CM | POA: Diagnosis not present

## 2020-11-23 DIAGNOSIS — R1312 Dysphagia, oropharyngeal phase: Secondary | ICD-10-CM | POA: Diagnosis not present

## 2020-11-23 DIAGNOSIS — Z931 Gastrostomy status: Secondary | ICD-10-CM | POA: Diagnosis not present

## 2020-11-23 DIAGNOSIS — G8929 Other chronic pain: Secondary | ICD-10-CM | POA: Diagnosis not present

## 2020-11-23 DIAGNOSIS — R41841 Cognitive communication deficit: Secondary | ICD-10-CM | POA: Diagnosis not present

## 2020-11-23 LAB — GLUCOSE, CAPILLARY
Glucose-Capillary: 102 mg/dL — ABNORMAL HIGH (ref 70–99)
Glucose-Capillary: 116 mg/dL — ABNORMAL HIGH (ref 70–99)
Glucose-Capillary: 94 mg/dL (ref 70–99)
Glucose-Capillary: 182 mg/dL — ABNORMAL HIGH (ref 70–99)

## 2020-11-23 MED ORDER — ZINC OXIDE 12.8 % EX OINT: TOPICAL_OINTMENT | Freq: Two times a day (BID) | CUTANEOUS | 0 refills | Status: DC

## 2020-11-23 MED ORDER — SENNA 8.6 MG PO TABS: 1.0000 | ORAL_TABLET | Freq: Two times a day (BID) | ORAL | 0 refills | Status: DC

## 2020-11-23 MED ORDER — MIRTAZAPINE 30 MG PO TBDP: 30.0000 mg | ORAL_TABLET | Freq: Every day | ORAL | Status: DC

## 2020-11-23 MED ORDER — OXYCODONE HCL 5 MG PO TABS: 5.0000 mg | ORAL_TABLET | ORAL | 0 refills | Status: AC | PRN

## 2020-11-23 MED ORDER — GABAPENTIN 300 MG PO CAPS: 300.0000 mg | ORAL_CAPSULE | Freq: Three times a day (TID) | ORAL | Status: AC

## 2020-11-23 MED ORDER — FAMOTIDINE 40 MG/5ML PO SUSR: 20.0000 mg | Freq: Every day | ORAL | 0 refills | Status: DC

## 2020-11-23 MED ORDER — BENZOCAINE 10 % MT GEL: OROMUCOSAL | 0 refills | Status: DC | PRN

## 2020-11-23 MED ORDER — MORPHINE SULFATE ER 15 MG PO TBCR: 15.0000 mg | EXTENDED_RELEASE_TABLET | Freq: Two times a day (BID) | ORAL | 0 refills | Status: DC

## 2020-11-23 MED ORDER — MIDODRINE HCL 5 MG PO TABS: 5.0000 mg | ORAL_TABLET | Freq: Three times a day (TID) | ORAL | Status: AC

## 2020-11-23 MED ORDER — DRONABINOL 2.5 MG PO CAPS: 2.5000 mg | ORAL_CAPSULE | Freq: Two times a day (BID) | ORAL | Status: DC

## 2020-11-23 MED ORDER — POLYETHYLENE GLYCOL 3350 17 G PO PACK: 17.0000 g | PACK | Freq: Every day | ORAL | 0 refills | Status: DC

## 2020-11-23 MED ORDER — CHLORHEXIDINE GLUCONATE 0.12 % MT SOLN: 15.0000 mL | Freq: Four times a day (QID) | OROMUCOSAL | 0 refills | Status: DC

## 2020-11-23 NOTE — Plan of Care (Signed)
  Problem: Consults Goal: RH GENERAL PATIENT EDUCATION Description: See Patient Education module for education specifics. Outcome: Completed/Met Goal: Skin Care Protocol Initiated - if Braden Score 18 or less Description: If consults are not indicated, leave blank or document N/A Outcome: Completed/Met Goal: Diabetes Guidelines if Diabetic/Glucose > 140 Description: If diabetic or lab glucose is > 140 mg/dl - Initiate Diabetes/Hyperglycemia Guidelines & Document Interventions  Outcome: Completed/Met   Problem: RH BOWEL ELIMINATION Goal: RH STG MANAGE BOWEL WITH ASSISTANCE Description: STG Manage Bowel with Max Assistance. Outcome: Completed/Met   Problem: RH BLADDER ELIMINATION Goal: RH STG MANAGE BLADDER WITH ASSISTANCE Description: STG Manage Bladder With Max Assistance Outcome: Completed/Met   Problem: RH SKIN INTEGRITY Goal: RH STG SKIN FREE OF INFECTION/BREAKDOWN Description: Skin to remain free from additional breakdown while on rehab with min assist. Outcome: Completed/Met Goal: RH STG MAINTAIN SKIN INTEGRITY WITH ASSISTANCE Description: STG Maintain Skin Integrity With [place]. Outcome: Completed/Met Goal: RH STG ABLE TO PERFORM INCISION/WOUND CARE W/ASSISTANCE Description: STG Able To Perform Incision/Wound Care With total Assistance. Outcome: Completed/Met   Problem: RH SAFETY Goal: RH STG ADHERE TO SAFETY PRECAUTIONS W/ASSISTANCE/DEVICE Description: STG Adhere to Safety Precautions With Min Assistance/Device. Outcome: Completed/Met   Problem: RH PAIN MANAGEMENT Goal: RH STG PAIN MANAGED AT OR BELOW PT'S PAIN GOAL Description: < 4 on a 0-10 pain scale. Outcome: Completed/Met   Problem: RH KNOWLEDGE DEFICIT GENERAL Goal: RH STG INCREASE KNOWLEDGE OF SELF CARE AFTER HOSPITALIZATION Description: Patient will be able to demonstrate knowledge of medication management, diabetes management, wound care management, bowel/bladder management will educational  materials and handouts provided by staff, at discharge independently. Outcome: Completed/Met

## 2020-11-23 NOTE — Progress Notes (Signed)
Occupational Therapy Discharge Summary  Patient Details  Name: [name] [name] MRN: [id number] Date of Birth: 1931-05-10   Patient has met 3 of 6 long term goals due to patient severely limited throughout her stay by fatigue, abdominal pain, PEG tube malfunction, medical complications, malnutrition, and other complications.  The pt has been very limited in participation with all therapies, making limited progress. Patient to discharge at overall Mod Assist level.  Patient's care partner is independent to provide the necessary physical assistance at discharge.  Pt will DC to SNF for further care and therapeutic interventions.   Reasons goals not met: Pt severely limited by pain, fatigue, and other medical complications.   Recommendation:  Patient will benefit from ongoing skilled OT services in skilled nursing facility setting to continue to advance functional skills in the area of BADL and Reduce care partner burden.  Equipment: No equipment provided  Reasons for discharge: lack of progress toward goals  Patient/family agrees with progress made and goals achieved: Yes  OT Discharge Precautions/Restrictions  Precautions Precautions: Fall Restrictions Weight Bearing Restrictions: No Pain Pain Assessment Pain Scale: 0-10 Pain Score: 5  (abdomen at PEG site) ADL ADL Eating: Not assessed Grooming: Setup Where Assessed-Grooming: Edge of bed Upper Body Bathing: Setup,Supervision/safety Where Assessed-Upper Body Bathing: Edge of bed Lower Body Bathing: Minimal assistance (at last assessment) Where Assessed-Lower Body Bathing: Other (Comment) (BSC) Upper Body Dressing: Minimal assistance Where Assessed-Upper Body Dressing: Other (Comment) (sitting on BSC) Lower Body Dressing: Maximal assistance Where Assessed-Lower Body Dressing: Other (Comment) (sit<stand from [facility]) Toileting: Maximal assistance Where Assessed-Toileting: Bedside Commode Toilet Transfer: Minimal assistance Toilet  Transfer Method: Stand pivot Science writer: Radiographer, therapeutic: Not assessed Vision Baseline Vision/History: Wears glasses Patient Visual Report: No change from baseline Perception  Perception: Within Functional Limits Praxis Praxis: Intact Cognition Arousal/Alertness: Awake/alert (inconsistent - sleepy at times d/t pain) Awareness: Appears intact Problem Solving: Appears intact Safety/Judgment: Appears intact Sensation Sensation Light Touch: Appears Intact Coordination Gross Motor Movements are Fluid and Coordinated: No Fine Motor Movements are Fluid and Coordinated: Yes Coordination and Movement Description: Guarded gross motor movements due to pain Motor  Motor Motor - Skilled Clinical Observations: General debility with pain limiting functional abilities Mobility  Transfers Sit to Stand: Minimal Assistance - Patient > 75% Stand to Sit: Minimal Assistance - Patient > 75%  Trunk/Postural Assessment  Cervical Assessment Cervical Assessment: Exceptions to [facility] Thoracic Assessment Thoracic Assessment: Exceptions to [facility] Lumbar Assessment Lumbar Assessment: Exceptions to [facility] Postural Control Postural Control: Deficits on evaluation  Balance Balance Balance Assessed: Yes Dynamic Sitting Balance Dynamic Sitting - Balance Support: During functional activity;Feet supported;No upper extremity supported Dynamic Sitting - Level of Assistance: 4: Min assist Dynamic Sitting - Balance Activities: Lateral lean/weight shifting;Forward lean/weight shifting Dynamic Standing Balance Dynamic Standing - Balance Support: During functional activity Dynamic Standing - Level of Assistance: 3: Mod assist Dynamic Standing - Balance Activities: Reaching for objects;Lateral lean/weight shifting Extremity/Trunk Assessment RUE Assessment RUE Assessment: Exceptions to [facility] General Strength Comments: 3/5 to 3+/5 grossly, very weak and deconditioned LUE Assessment LUE  Assessment: Exceptions to [facility] General Strength Comments: 3/5 to 3+/5 grossly, very weak and deconditioned   Viona Gilmore 11/23/2020, 1:31 PM

## 2020-11-23 NOTE — Progress Notes (Signed)
PROGRESS NOTE   Subjective/Complaints:  Pt reports she is tried- but willing to go to SNF today.   ROS:  Pt denies SOB, abd pain, CP, N/V/C/D, and vision changes   Objective:   No results found. No results for input(s): WBC, HGB, HCT, PLT in the last 72 hours. No results for input(s): NA, K, CL, CO2, GLUCOSE, BUN, CREATININE, CALCIUM in the last 72 hours.  Intake/Output Summary (Last 24 hours) at 11/23/2020 0857 Last data filed at 11/23/2020 0807 Gross per 24 hour  Intake 500 ml  Output --  Net 500 ml       Physical Exam: Vital Signs Blood pressure 108/64, pulse (!) 107, temperature 99.5 F (37.5 C), temperature source Oral, resp. rate 20, height 5\' 4"  (1.626 m), weight 38 kg, SpO2 96 %.    General: awake, alert, sleepy, more frail appearing;  NAD HENT: conjugate gaze; oropharynx moist CV: regular rhythm, mildly tachycardic rate; no JVD Pulmonary: CTA B/L; no W/R/R- good air movement in spite of cough intermittently GI: soft, NT, ND, (+)BS Psychiatric: less interactive Neurological: alert; cachetic!   Musculoskeletal:     Cervical back: Normal range of motion and neck supple.     Comments: No edema or tenderness in extremities  Skin: Has a stage 3- tiny- less than pencil diameter, it's so small- qtip wouldn't fit easily- on coccyx -also has a barely open- very superficial spot on L inner buttock The R lumbar and other spot on coccyx have healed over-   Neurological:     Mental Status: She is alert.     Comments: Alert and oriented HOH Motor: B/l UE: 4+/5 proximal to distal RLE: 4/5 proximal to distal 3- ADF  LLE: 4/5 HF, KE, 3-/5 ADF     Assessment/Plan: 1. Functional deficits which require 3+ hours per day of interdisciplinary therapy in a comprehensive inpatient rehab setting.  Physiatrist is providing close team supervision and 24 hour management of active medical problems listed  below.  Physiatrist and rehab team continue to assess barriers to discharge/monitor patient progress toward functional and medical goals  Care Tool:  Bathing    Body parts bathed by patient: Right arm,Left arm,Chest,Abdomen,Front perineal area,Buttocks,Right upper leg,Left upper leg,Face   Body parts bathed by helper: Right lower leg,Left lower leg     Bathing assist Assist Level: Minimal Assistance - Patient > 75%     Upper Body Dressing/Undressing Upper body dressing   What is the patient wearing?: Pull over shirt    Upper body assist Assist Level: Minimal Assistance - Patient > 75%    Lower Body Dressing/Undressing Lower body dressing      What is the patient wearing?: Pants     Lower body assist Assist for lower body dressing: Moderate Assistance - Patient 50 - 74%     Toileting Toileting    Toileting assist Assist for toileting: Moderate Assistance - Patient 50 - 74% Assistive Device Comment: incontinent brief   Transfers Chair/bed transfer  Transfers assist  Chair/bed transfer activity did not occur: Safety/medical concerns  Chair/bed transfer assist level: Minimal Assistance - Patient > 75%     Locomotion Ambulation   Ambulation assist  Ambulation activity did not occur: Safety/medical concerns          Walk 10 feet activity   Assist  Walk 10 feet activity did not occur: Safety/medical concerns        Walk 50 feet activity   Assist Walk 50 feet with 2 turns activity did not occur: Safety/medical concerns         Walk 150 feet activity   Assist Walk 150 feet activity did not occur: Safety/medical concerns         Walk 10 feet on uneven surface  activity   Assist Walk 10 feet on uneven surfaces activity did not occur: Safety/medical concerns         Wheelchair     Assist Will patient use wheelchair at discharge?: Yes (pt will likely require WC at DC however pt unable to tolerate WC transfer or assessment on  this date) Type of Wheelchair: Manual Wheelchair activity did not occur: Safety/medical concerns         Wheelchair 50 feet with 2 turns activity    Assist    Wheelchair 50 feet with 2 turns activity did not occur: Safety/medical concerns       Wheelchair 150 feet activity     Assist  Wheelchair 150 feet activity did not occur: Safety/medical concerns       Blood pressure 108/64, pulse (!) 107, temperature 99.5 F (37.5 C), temperature source Oral, resp. rate 20, height 5\' 4"  (1.626 m), weight 38 kg, SpO2 96 %.  Medical Problem List and Plan: 1.  Debility secondary to abdominal wall cellulitis/sepsis complicated by chronic hypoxic respiratory failure/RA             -patient may not shower             -ELOS/Goals: 14-17 days/Min A             will change to 1x/day of PT and OT- and look for SNF bed- since so low function per pt and PT/OT  -con't PT and OT_ might have SNF bed for Monday - stable for D/C to SNF in am if bed available  -d/c to SNF today 2.  Impaired mobility -DVT/anticoagulation: Continue Lovenox             -antiplatelet therapy: N/A 3. Pain Management: Tylenol as needed  4/29- tried tramadol - didn't work- and try Norco 5/325 mg q4 hours prn  4/30: having 10/10 pain with dressing change, continue Norco and give 30 minutes prior to dressing change, Caryl Pina will pass onto oncoming nurse  5/1: AST elevated and pain prohibiting participation with therapy. Switch to oxycodone for better pain control and less tylenol.   5/2- AST is 42- very slightly elevated, likely from frequent tylenol usage- will ask nursing to use Oxy and not tylenol for pain.    5/13- will add MS Contin 15 mg BID= explained could make her sleepy/sleepier, but the benefit COULD be to improve pain- if gets too sleepy, will stop it. con't 6am Oxycodone and prn  5/16- d/c to SNF today- con't Pain meds right now- might need to stop MS Contin if too sedated 4. Mood: Remeron 15 mg  nightly  5/3- will change to 30 mg QHS- is better dose for appetite             -antipsychotic agents: N/A 5. Neuropsych: This patient is capable of making decisions on her own behalf. 6. Skin/Wound Care: Routine skin checks.  Foam dressing to umbilicus and change  every 3 days as needed soiling.  Apply saline moistened 2 x 2 to abdomen wound daily cover with dry 2 x 2 and tape.  Apply dry split thickness gauze underneath G-tube daily.  4/30: reconsulted wound care as PEG site with some leaking TF  5/2- said it's due to fistula- won't follow, but remain available. Also has 3- Stage II small spots on coccyx x2 and 1 on L inner buttock- con't wound care/foam  5/4- will put on air mattress since not moving and has 3 Stage II's-  5/5- spoke to nurse- they will get pt air mattress due to BMI 13- extrmely cachetic and has pressure ulcers x3 Stage II on backside.    5/7- has a tiny stage III and small stage II on L inner buttock- con't air mattress and wound care  5/11- on air mattress- will con't and con't wound care  5/12- Will assess bigger abd wound for possible silver nitrate to be done  5/13- Was able to do without silver nitrate- by unrolling edges- changed by WOC to silver alginate to be changed q3 days- con't regimen  5/16- con't wound care- no change 7. Fluids/Electrolytes/Nutrition: Routine in and outs.             CMP ordered 8.  Rheumatoid arthritis.  Continue prednisone. 9.  Chronic hypoxic respiratory failure.  Oxygen therapy as directed continue chronic prednisone.             Monitor O2 sats with increased exertion  5/16- con't O@ continuous 10.  Dysphagia.  Dysphagia #1 thin liquids.  G-tube replaced 10/29/2020 per interventional radiology.  Dietary follow-up             Advance diet as tolerated  4/29- PEG- fell out- got back in by radiology-but has high risk of coming out- came out with balloon intact- con't to monitor   5/5- gastric contents burning skin- WOC consulted- will change  orders.  5/8- pt says she's eating, but no improvement in weight- con't to encourage   5/9: PEG fell out- consulting IR regarding replacement.  5/10- to suture PEG in today!   5/11- Will see if sutured in- wasn't in note that was or not.   5/12- isn't sutured in- will ask IR to see if they can help? Maybe suture it in, because notes doesn't mention it.   5/13- NOT able to suture in due to skin burnt by acids of stomach- IR recommends pull through gastrotomy if comes out again.  11.  Thrush/mouth pain/odynophagia.  Follow Dr. Benson Norway  Per pain as above 12. Severe malnutrition  5/10- BMI down to 12.49 since no TFs x2 days- trying to get it back in- due to her being so cachetic.  5/12- BMI 12.11- same- con't to push intake   5/13- BMI jumped today to 13.24- likely false, but will monitor  5/16- BMI supposedly 14.38 today- I'm not sure why- she looks MORE frail- con't regimen 13. Hypotension-   4/29- BP running 80s/40s-will try Low dose Midodrine- 2.5 mg TID with meals- for lower BP.   5/3- BP 90s/50s- better- no dizziness- con't regimen  5/4 will increase to 5 mg TID with meals- since BP 80/s40s again  5/6- BP much better 117/60s- con't regimen  5/7- maybe not drinking as much- BP 88/52 today- will push fluids- also TFs weren't run full length last night- con't regimen  5/11- stopped IVFs today that were placed due to lack of PEG x 2 days. BP better 109/66  this AM- con't Midodrine.  14. Loose stools: likely 2/2 TF, continue to monitor  5/7- now constipated- LBM 5/3- will add Senokot 1 tab BID and monitor  5/8- add Miralax daily and gave 1 dose Sorbitol- if no improvement, needs KUB tomorrow  5/10- LBM evening of 5/8- large- con't regimen  5/13- had BM 15. Iron deficiency: started iron supplement 16. Elevated CBGs: likely secondary to TF- continue to monitor   17.  B foot drop likely neuropathy no back pain will order Prevalon boots at noc   LOS: 19 days A FACE TO [facility] 11/23/2020, 8:57 AM

## 2020-11-23 NOTE — Progress Notes (Signed)
Patient discharged off of unit with all belongings. Discharge papers/instructions explained by physician assistant to family. Patient and family have no further questions at time of discharge. Patient taken by EMS to nursing home, report called and given to [facility].  [place]

## 2020-11-24 DIAGNOSIS — J8489 Other specified interstitial pulmonary diseases: Secondary | ICD-10-CM | POA: Diagnosis not present

## 2020-11-24 DIAGNOSIS — L89152 Pressure ulcer of sacral region, stage 2: Secondary | ICD-10-CM | POA: Diagnosis not present

## 2020-11-24 DIAGNOSIS — Z86711 Personal history of pulmonary embolism: Secondary | ICD-10-CM | POA: Diagnosis not present

## 2020-11-24 DIAGNOSIS — J9610 Chronic respiratory failure, unspecified whether with hypoxia or hypercapnia: Secondary | ICD-10-CM | POA: Diagnosis not present

## 2020-11-24 DIAGNOSIS — R64 Cachexia: Secondary | ICD-10-CM | POA: Diagnosis not present

## 2020-11-24 DIAGNOSIS — E782 Mixed hyperlipidemia: Secondary | ICD-10-CM | POA: Diagnosis not present

## 2020-11-24 DIAGNOSIS — D649 Anemia, unspecified: Secondary | ICD-10-CM | POA: Diagnosis not present

## 2020-11-24 DIAGNOSIS — M6281 Muscle weakness (generalized): Secondary | ICD-10-CM | POA: Diagnosis not present

## 2020-11-24 DIAGNOSIS — R2681 Unsteadiness on feet: Secondary | ICD-10-CM | POA: Diagnosis not present

## 2020-11-24 DIAGNOSIS — Z931 Gastrostomy status: Secondary | ICD-10-CM | POA: Diagnosis not present

## 2020-11-24 DIAGNOSIS — L03311 Cellulitis of abdominal wall: Secondary | ICD-10-CM | POA: Diagnosis not present

## 2020-11-24 DIAGNOSIS — I10 Essential (primary) hypertension: Secondary | ICD-10-CM | POA: Diagnosis not present

## 2020-11-24 NOTE — Progress Notes (Signed)
Physical Therapy Discharge Summary  Patient Details  Name: [name] [name] MRN: [id number] Date of Birth: [birth year]  Today's Date: 11/24/2020      Patient has met 5 of 7 long term goals due to improved activity tolerance and ability to compensate for deficits.  Patient to discharge at a wheelchair level [place].   Patient's care partner is independent to provide the necessary physical assistance at discharge.  Reasons goals not met: pt has met limited goals and made limited progress due to patient profoundly limited throughout her stay by fatigue, abdominal pain, PEG tube malfunction, medical complications, malnutrition, and other complications.  The pt has been very limited in participation with all therapies, making limited progress.    Recommendation:  Patient will benefit from ongoing skilled PT services in skilled nursing facility setting to continue to advance safe functional mobility, address ongoing impairments in tolerance to activity, sitting and standing balance, global strength, WC mobility, gait training, and minimize fall risk.  Equipment: No equipment provided  Reasons for discharge: discharge from hospital  Patient/family agrees with progress made and goals achieved: Yes  PT Discharge Precautions/Restrictions Precautions Precautions: Fall Restrictions Weight Bearing Restrictions: No Vital Signs  Pain   Vision/Perception  Perception Perception: Within Functional Limits Praxis Praxis: Intact  Cognition Arousal/Alertness: Awake/alert (inconsistent - sleepy at times d/t pain) Orientation Level: Oriented to time;Oriented to place Awareness: Appears intact Problem Solving: Appears intact Safety/Judgment: Appears intact Sensation Sensation Light Touch: Appears Intact Coordination Gross Motor Movements are Fluid and Coordinated: No Fine Motor Movements are Fluid and Coordinated: Yes Coordination and Movement Description: Guarded gross motor movements due  to pain Motor  Motor Motor - Skilled Clinical Observations: General debility with pain limiting functional abilities  Mobility Bed Mobility Bed Mobility: Rolling Right;Rolling Left;Supine to Sit;Sit to Supine Rolling Right: Contact Guard/Touching assist Rolling Left: Contact Guard/Touching assist Supine to Sit: Minimal Assistance - Patient > 75% Sit to Supine: Minimal Assistance - Patient > 75% Transfers Transfers: Sit to Stand;Stand to Sit;Stand Pivot Transfers Sit to Stand: Minimal Assistance - Patient > 75% Stand to Sit: Minimal Assistance - Patient > 75% Stand Pivot Transfers: Minimal Assistance - Patient > 75% Stand Pivot Transfer Details: Verbal cues for sequencing;Verbal cues for technique;Verbal cues for safe use of DME/AE;Verbal cues for precautions/safety Transfer (Assistive device): None Locomotion  Gait Ambulation: No Gait Gait: No Stairs / Additional Locomotion Stairs: No Wheelchair Mobility Wheelchair Mobility: Yes (pt unable to ambulate during admission, WC recommended) Wheelchair Assistance: Total Assistance - Patient <25%  Trunk/Postural Assessment  Cervical Assessment Cervical Assessment: Exceptions to [facility] Thoracic Assessment Thoracic Assessment: Exceptions to [facility] Lumbar Assessment Lumbar Assessment: Exceptions to [facility] Postural Control Postural Control: Deficits on evaluation  Balance Balance Balance Assessed: Yes Dynamic Sitting Balance Dynamic Sitting - Balance Support: During functional activity;Feet supported;No upper extremity supported Dynamic Sitting - Level of Assistance: 4: Min assist Dynamic Sitting - Balance Activities: Lateral lean/weight shifting;Forward lean/weight shifting Dynamic Standing Balance Dynamic Standing - Balance Support: During functional activity Dynamic Standing - Level of Assistance: 3: Mod assist Dynamic Standing - Balance Activities: Reaching for objects;Lateral lean/weight shifting Extremity Assessment      RLE  Assessment General Strength Comments: grossly 3/5 assessed in bed during admission. LLE Assessment General Strength Comments: grossly 3/5 assessed in bed assessed during admission functionally    Junie Panning 11/24/2020, 9:46 AM

## 2020-11-25 DIAGNOSIS — E46 Unspecified protein-calorie malnutrition: Secondary | ICD-10-CM | POA: Diagnosis not present

## 2020-11-25 LAB — GLUCOSE, CAPILLARY: Glucose-Capillary: 116 mg/dL — ABNORMAL HIGH (ref 70–99)

## 2020-11-26 DIAGNOSIS — E782 Mixed hyperlipidemia: Secondary | ICD-10-CM | POA: Diagnosis not present

## 2020-11-26 DIAGNOSIS — R64 Cachexia: Secondary | ICD-10-CM | POA: Diagnosis not present

## 2020-11-26 DIAGNOSIS — D649 Anemia, unspecified: Secondary | ICD-10-CM | POA: Diagnosis not present

## 2020-11-26 DIAGNOSIS — Z86711 Personal history of pulmonary embolism: Secondary | ICD-10-CM | POA: Diagnosis not present

## 2020-11-26 DIAGNOSIS — L03311 Cellulitis of abdominal wall: Secondary | ICD-10-CM | POA: Diagnosis not present

## 2020-11-26 DIAGNOSIS — M6281 Muscle weakness (generalized): Secondary | ICD-10-CM | POA: Diagnosis not present

## 2020-11-26 DIAGNOSIS — J9610 Chronic respiratory failure, unspecified whether with hypoxia or hypercapnia: Secondary | ICD-10-CM | POA: Diagnosis not present

## 2020-11-26 DIAGNOSIS — J8489 Other specified interstitial pulmonary diseases: Secondary | ICD-10-CM | POA: Diagnosis not present

## 2020-11-26 DIAGNOSIS — I10 Essential (primary) hypertension: Secondary | ICD-10-CM | POA: Diagnosis not present

## 2020-11-26 DIAGNOSIS — L89152 Pressure ulcer of sacral region, stage 2: Secondary | ICD-10-CM | POA: Diagnosis not present

## 2020-11-26 DIAGNOSIS — R2681 Unsteadiness on feet: Secondary | ICD-10-CM | POA: Diagnosis not present

## 2020-11-26 DIAGNOSIS — Z931 Gastrostomy status: Secondary | ICD-10-CM | POA: Diagnosis not present

## 2020-11-27 ENCOUNTER — Encounter: Payer: Self-pay | Admitting: Adult Health Nurse Practitioner

## 2020-11-27 ENCOUNTER — Ambulatory Visit: Payer: Medicare Other | Admitting: Podiatry

## 2020-11-27 ENCOUNTER — Other Ambulatory Visit: Payer: Self-pay

## 2020-11-27 ENCOUNTER — Non-Acute Institutional Stay: Payer: Medicare Other | Admitting: Adult Health Nurse Practitioner

## 2020-11-27 VITALS — HR 87 | Wt 87.6 lb

## 2020-11-27 DIAGNOSIS — R400 Somnolence: Secondary | ICD-10-CM | POA: Diagnosis not present

## 2020-11-27 DIAGNOSIS — Z515 Encounter for palliative care: Secondary | ICD-10-CM

## 2020-11-27 DIAGNOSIS — T8131XS Disruption of external operation (surgical) wound, not elsewhere classified, sequela: Secondary | ICD-10-CM | POA: Diagnosis not present

## 2020-11-27 DIAGNOSIS — E43 Unspecified severe protein-calorie malnutrition: Secondary | ICD-10-CM | POA: Diagnosis not present

## 2020-11-27 NOTE — Progress Notes (Addendum)
[facility] Consult Note Telephone: [phone]  Fax: [phone]    Date of encounter: 11/27/20 PATIENT NAME: [name] [name] [address] [place] [address]   [phone] (home)  DOB: Feb 06, 1931 MRN: [id number] PRIMARY CARE PROVIDER:    Hoyt Koch, MD,  [place] [address] [phone]  REFERRING PROVIDER:   Roddie Mc PA  RESPONSIBLE PARTY:    Contact Information    Name Relation Home Work [facility] [phone]  [phone]   Vinnie Level Relative [phone]  [phone]   [name] [phone]  [phone]       I met face to face with patient in facility. Palliative Care was asked to follow this patient by consultation request of  Roddie Mc PA to address advance care planning and complex medical decision making. This is a follow up visit.  Called sister in law to update on today's visit.  Left VM with reason for call and contact info                                   ASSESSMENT AND PLAN / RECOMMENDATIONS:   Advance Care Planning/Goals of Care: Goals include to maximize quality of life and symptom management. Palliative notes from hospital indicate that she is adamant about having everything done to prolong her life.    CODE STATUS:  Full code  Symptom Management/Plan:  Drowsiness: Believed this may be due to the MS Contin.  Discussed with provider at facility and this has been stopped.  She still has oxycodone 5 mg Q4 hrs PRN for pain and discussed with staff that she may need this prior to care.  PCM: Has PEG tube in place and is able to take in some oral intake as well.  All meds given through PEG tube. Continue to monitor weight  Dehisced wound:  This is being followed by wound nurse at facility. Patient originally had surgery for SBO in February of this year.  At this time she had issues with the surgical incision dehiscing.   Wound continues to be nonhealing and this is in part due to malnutrition.  Patient is frail and at risk for further hospitalizations.  Will continue to have conversations with patient for complex medical decision making and ACP when she is not so drowsy.  Have left message with sister-in-law as well to discuss the goals of care for this patient.  Follow up Palliative Care Visit: Palliative care will continue to follow for complex medical decision making, advance care planning, and clarification of goals. Return 2-4 weeks or prn.  I spent 35 minutes providing this consultation. More than 50% of the time in this consultation was spent in counseling and care coordination.  PPS: 40%  HOSPICE ELIGIBILITY/DIAGNOSIS: TBD  Chief Complaint: follow up palliative visit  HISTORY OF PRESENT ILLNESS:  [name] [name] is a 87 y.o. year old female  with PCM, CHF, pulmonary fibrosis, RA, CAD, iron deficiency anemia, HLD, DJD.  Patient was seen in the ER on 09/30/2020 for postsurgical pain from previous surgery for SBO.  On 4/15 through 11/04/2020 patient was hospitalized for sepsis related to infection of the abdominal wound.  Patient developed odynophagia and PEG tube was placed to ensure patient was getting adequate nutrition for wound healing.  After this hospitalization patient went to CIR until 11/23/2020.  Patient still gets nutrition and meds through  PEG tube.  Staff does report that she gets some oral intake as well.  Staff reports that she is sluggish today and she is usually sluggish after getting her MS Contin.  Staff does report that she can get up and walk with walker with standby assist and she will get up and sit and the chair and participate with PT/OT when she is not sluggish.  Patient is drowsy today and does not participate with HPI/ROS.  Patient's weight had dropped down to its lowest at around 77 pounds and currently weighs about 87 pounds.  She is continent of bowel and bladder though at times is unable  to make it to the bathroom.  History obtained from review of EMR, discussion with primary team, and interview with facility staff and Ms. Coryell.  I reviewed available labs, medications, imaging, studies and related documents from the EMR.  Records reviewed and summarized above.     Physical Exam:  Constitutional: NAD; drowsy General: frail appearing, thin  EYES: anicteric sclera, lids intact, no discharge  ENMT: intact hearing, oral mucous membranes moist CV: S1S2, RRR, no LE edema Pulmonary: LCTA, no increased work of breathing, no cough Abdomen: hypoactive BS + 4 quadrants, PEG tube in place MSK: moves all extremities, ambulatory with walker Skin: warm and dry, dehisced wound (being followed by wound nurse at facility; did not remove bandages for exam today) Neuro:  Patient drowsy today, possibly due to her MS Contin   Thank you for the opportunity to participate in the care of [name] [name].  The palliative care team will continue to follow. Please call our office at [phone] if we can be of additional assistance.   Milessa Hogan [name], NP , DNP  This chart was dictated using voice recognition software. Despite best efforts to proofread, errors can occur which can change the documentation meaning.   COVID-19 PATIENT SCREENING TOOL Asked and negative response unless otherwise noted:   Have you had symptoms of covid, tested positive or been in contact with someone with symptoms/positive test in the past 5-10 days? negative

## 2020-11-29 ENCOUNTER — Emergency Department (HOSPITAL_COMMUNITY): Payer: Medicare Other

## 2020-11-29 ENCOUNTER — Other Ambulatory Visit: Payer: Self-pay

## 2020-11-29 ENCOUNTER — Emergency Department (HOSPITAL_COMMUNITY)
Admission: EM | Admit: 2020-11-29 | Discharge: 2020-11-29 | Disposition: A | Discharge status: Home / Self Care | Payer: Medicare Other | Attending: Emergency Medicine | Admitting: Emergency Medicine

## 2020-11-29 ENCOUNTER — Encounter (HOSPITAL_COMMUNITY): Payer: Self-pay

## 2020-11-29 DIAGNOSIS — Z4682 Encounter for fitting and adjustment of non-vascular catheter: Secondary | ICD-10-CM | POA: Diagnosis not present

## 2020-11-29 DIAGNOSIS — Z96653 Presence of artificial knee joint, bilateral: Secondary | ICD-10-CM | POA: Diagnosis not present

## 2020-11-29 DIAGNOSIS — Z9861 Coronary angioplasty status: Secondary | ICD-10-CM | POA: Insufficient documentation

## 2020-11-29 DIAGNOSIS — I5032 Chronic diastolic (congestive) heart failure: Secondary | ICD-10-CM | POA: Insufficient documentation

## 2020-11-29 DIAGNOSIS — R109 Unspecified abdominal pain: Secondary | ICD-10-CM | POA: Diagnosis not present

## 2020-11-29 DIAGNOSIS — I2510 Atherosclerotic heart disease of native coronary artery without angina pectoris: Secondary | ICD-10-CM | POA: Insufficient documentation

## 2020-11-29 DIAGNOSIS — K9423 Gastrostomy malfunction: Secondary | ICD-10-CM | POA: Diagnosis not present

## 2020-11-29 MED ORDER — DIATRIZOATE MEGLUMINE & SODIUM 66-10 % PO SOLN
30.0000 mL | Freq: Once | ORAL | Status: DC
  Filled 2020-11-29: qty 30

## 2020-11-29 MED ORDER — DIATRIZOATE MEGLUMINE & SODIUM 66-10 % PO SOLN
ORAL | Status: AC
  Filled 2020-11-29: qty 30

## 2020-11-29 NOTE — ED Notes (Signed)
Attempted to call report at [facility], transferred to answering machine

## 2020-11-29 NOTE — ED Triage Notes (Signed)
Pt brought in by EMS for a dislodged peg tube from [facility].

## 2020-11-29 NOTE — ED Provider Notes (Signed)
[facility] EMERGENCY DEPARTMENT Provider Note   CSN: [id number] Arrival date & time: 11/29/20  0630     History Chief Complaint  Patient presents with  . Abdominal Pain    Peg tube dislodged    [name] [name] is a 87 y.o. female.  HPI Patient presents from nursing facility with concern for dislodged feeding tube.  She notes some soreness around the area.  She initially states that the tube was pulled entirely, clear on exam and patient's tube is still in place.  Per report the patient's feeding tube was dislodged earlier in the day.  She denies other changes, complaints, pain, and no reported fever, change in interactivity. She notes that she is dependent on the feeding tube for some supplements, medication, but cannot eat and drink orally.    Past Medical History:  Diagnosis Date  . Allergic rhinitis   . Allergy   . CAD (coronary artery disease)    Mild CAD by cath 2008  . CHF (congestive heart failure) (HCC)   . DJD (degenerative joint disease)    rheumatoid  . DVT (deep venous thrombosis) (HCC)   . Dyslipidemia   . History of echocardiogram    Echo 12/2018: EF 60-65, mod asymmetric LVH, Gr 1 DD  . History of nuclear stress test    Myoview 5/17:  EF 74%, normal perfusion, low risk // Myoview 12/2018:  EF 89, very mild ischemia in inferoapical wall; Low Risk  . History of pulmonary embolism   . Hyperlipidemia   . Insomnia   . Osteoporosis   . Psoriasis   . Pulmonary fibrosis (HCC)   . Rheumatoid arthritis [facility])     Patient Active Problem List   Diagnosis Date Noted  . Debility 11/04/2020  . Dysphagia   . Oral thrush   . Encounter for dental examination   . Facial swelling   . Atrophy of edentulous alveolar ridge   . Abdominal wall cellulitis 10/24/2020  . Severe sepsis (HCC) 10/23/2020  . Abdominal pain 10/20/2020  . Diarrhea 10/02/2020  . Pressure injury of skin 09/06/2020  . Acute on chronic respiratory failure with hypoxemia (HCC)  08/23/2020  . Protein-calorie malnutrition, severe 08/20/2020  . Fall 01/25/2020  . Other fatigue 01/03/2020  . Left shoulder pain 01/03/2020  . Urinary frequency 01/03/2020  . Acute otalgia, left 09/06/2019  . Unintentional weight loss 07/18/2019  . Personal history of PE (pulmonary embolism) 04/23/2019  . Headache 02/11/2019  . Iron deficiency anemia 12/21/2018  . Cold sore 10/23/2018  . Blood in stool 09/14/2018  . 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 Therapeutic drug monitoring 07/17/2018  . Chronic diastolic CHF (congestive heart failure) (HCC) 12/19/2017  . Rash 08/11/2017  . Chronic respiratory failure with hypoxia (HCC) 03/30/2017  . Angular cheilitis 03/08/2017  . Leg pain 10/25/2016  . Back pain 08/05/2016  . RA (rheumatoid arthritis) (HCC) 03/31/2015  . Allergic rhinitis   . Varicose vein 09/11/2014  . ILD (interstitial lung disease) (HCC) 06/25/2012  . Chest pain 02/27/2012  . CAD (coronary artery disease) 02/27/2012  . Pruritus 08/18/2009  . Postinflammatory pulmonary fibrosis / RA ILD  06/30/2008  . Constipation 01/03/2008  . Dyslipidemia 06/16/2007    Past Surgical History:  Procedure Laterality Date  . ABDOMINAL HYSTERECTOMY  1974  . APPENDECTOMY  1959  . CATARACT EXTRACTION    . IR GJ TUBE CHANGE  11/05/2020  . IR REPLC GASTRO/COLONIC TUBE PERCUT W/FLUORO  10/29/2020  . IR REPLC GASTRO/COLONIC TUBE PERCUT  W/FLUORO  11/17/2020  . LAPAROTOMY N/A 08/23/2020   Procedure: EXPLORATORY LAPAROTOMY small bowel resection gastrostomy tube placement;  Surgeon: Dwan Bolt, MD;  Location: [facility] ORS;  Service: General;  Laterality: N/A;  . LEFT HEART CATHETERIZATION WITH CORONARY ANGIOGRAM N/A 06/18/2013   Procedure: LEFT HEART CATHETERIZATION WITH CORONARY ANGIOGRAM;  Surgeon: Peter M Martinique, MD;  Location: [facility] CATH LAB;  Service: Cardiovascular;  Laterality: N/A;  . TONSILLECTOMY  1941, 1951  . TOTAL KNEE ARTHROPLASTY  1997, 2007   Bilateral     OB History    Gravida  4    Para  2   Term  2   Preterm      AB      Living  0     SAB      IAB      Ectopic      Multiple      Live Births              Family History  Problem Relation Age of Onset  . Kidney disease Daughter 5  . Cancer Daughter   . Heart attack Father 47  . Alzheimer's disease Sister   . Heart disease Sister   . Alzheimer's disease Sister     Social History   Tobacco Use  . Smoking status: Former Smoker    Packs/day: 0.50    Years: 20.00    Pack years: 10.00    Types: Cigarettes    Quit date: 07/11/1973    Years since quitting: 47.4  . Smokeless tobacco: Never Used  Vaping Use  . Vaping Use: Never used  Substance Use Topics  . Alcohol use: No  . Drug use: No    Home Medications Prior to Admission medications   Medication Sig Start Date End Date Taking? Authorizing Provider  acetaminophen (TYLENOL) 325 MG tablet Take 2 tablets (650 mg total) by mouth every 6 (six) hours as needed for mild pain, fever or headache (or Fever >/= 101). Patient taking differently: Place 650 mg into feeding tube every 6 (six) hours as needed for mild pain, fever or headache (or Fever >/= 101). 11/04/20  Yes Gonfa, Taye T, MD  benzocaine (ORAJEL) 10 % mucosal gel Use as directed in the mouth or throat as needed for mouth pain. 11/23/20  Yes Angiulli, Lavon Paganini, PA-C  chlorhexidine (PERIDEX) 0.12 % solution Use as directed 15 mLs in the mouth or throat 4 (four) times daily. 11/23/20  Yes Angiulli, Lavon Paganini, PA-C  famotidine (PEPCID) 20 MG tablet Place 20 mg into feeding tube daily.   Yes [provider]  fluticasone (FLONASE) 50 MCG/ACT nasal spray Place 2 sprays into both nostrils daily as needed for allergies or rhinitis. 03/17/15  Yes Hoyt Koch, MD  gabapentin (NEURONTIN) 300 MG capsule Take 1 capsule (300 mg total) by mouth 3 (three) times daily. Patient taking differently: Place 300 mg into feeding tube 3 (three) times daily. 11/23/20  Yes Angiulli, Lavon Paganini, PA-C   lactose free nutrition (BOOST) LIQD Place 1 Container into feeding tube in the morning and at bedtime.   Yes [provider]  Lidocaine 4 % PTCH Apply 1 patch topically See admin instructions. Apply to right forearm leave on for 12 hours then remove   Yes [provider]  lipase/protease/amylase (CREON) 36000 UNITS CPEP capsule Take 1 capsule (36,000 Units total) by mouth 3 (three) times daily before meals. Patient taking differently: 36,000 Units 3 (three) times daily before meals. Via tube  10/09/20  Yes Hoyt Koch, MD  midodrine (PROAMATINE) 5 MG tablet Take 1 tablet (5 mg total) by mouth 3 (three) times daily with meals. Patient taking differently: Place 5 mg into feeding tube 3 (three) times daily with meals. 11/23/20  Yes Angiulli, Lavon Paganini, PA-C  mirtazapine (REMERON) 15 MG tablet Place 15 mg into feeding tube at bedtime.   Yes [provider]  morphine (MS CONTIN) 15 MG 12 hr tablet Take 1 tablet (15 mg total) by mouth every 12 (twelve) hours. Patient taking differently: 15 mg every 12 (twelve) hours. Via tube 11/23/20  Yes Angiulli, Lavon Paganini, PA-C  Multiple Vitamin (MULTIVITAMIN WITH MINERALS) TABS tablet Take 1 tablet by mouth daily. Patient taking differently: Place 1 tablet into feeding tube daily. 11/05/20  Yes Mercy Riding, MD  mycophenolate (CELLCEPT) 500 MG tablet 2 tabs bid Patient taking differently: Place 1,000 mg into feeding tube 2 (two) times daily. 01/27/20  Yes Charlynne Cousins, MD  Nintedanib (OFEV) 100 MG CAPS Take 1 capsule (100 mg total) by mouth 2 (two) times daily. Patient taking differently: Place 100 mg into feeding tube 2 (two) times daily. 08/18/20  Yes Mannam, Praveen, MD  nitroGLYCERIN (NITROSTAT) 0.3 MG SL tablet Place 0.3 mg under the tongue every 5 (five) minutes as needed for chest pain.   Yes [provider]  Nutritional Supplements (ENSURE ORIGINAL PO) 237 mLs by PEG Tube route daily.   Yes [provider]   nystatin (MYCOSTATIN/NYSTOP) powder Apply 1 application topically See admin instructions. Apply qd around peg of circle   Yes [provider]  oxyCODONE (OXY IR/ROXICODONE) 5 MG immediate release tablet Take 1 tablet (5 mg total) by mouth every 4 (four) hours as needed for severe pain. Patient taking differently: Place 5 mg into feeding tube every 4 (four) hours as needed for severe pain. 11/23/20  Yes Angiulli, Lavon Paganini, PA-C  polyethylene glycol (MIRALAX / GLYCOLAX) 17 g packet Take 17 g by mouth daily. Patient taking differently: Place 17 g into feeding tube daily. 11/23/20  Yes Angiulli, Lavon Paganini, PA-C  predniSONE (DELTASONE) 5 MG tablet TAKE 1 TABLET DAILY WITH BREAKFAST Patient taking differently: Place 5 mg into feeding tube daily with breakfast. 05/08/20  Yes Mannam, Praveen, MD  dronabinol (MARINOL) 2.5 MG capsule Take 1 capsule (2.5 mg total) by mouth 2 (two) times daily before lunch and supper. Patient not taking: Reported on 11/29/2020 11/23/20   Angiulli, Lavon Paganini, PA-C  famotidine (PEPCID) 40 MG/5ML suspension Take 2.5 mLs (20 mg total) by mouth daily. Patient not taking: No sig reported 11/23/20   Angiulli, Lavon Paganini, PA-C  mirtazapine (REMERON SOL-TAB) 30 MG disintegrating tablet Take 1 tablet (30 mg total) by mouth at bedtime. Patient not taking: No sig reported 11/23/20   Angiulli, Lavon Paganini, PA-C  nitroGLYCERIN (NITROSTAT) 0.4 MG SL tablet Place 1 tablet (0.4 mg total) under the tongue every 5 (five) minutes as needed for chest pain. Patient not taking: Reported on 11/29/2020 06/29/20 09/27/20  Burnell Blanks, MD  Nutritional Supplements (FEEDING SUPPLEMENT, OSMOLITE 1.2 CAL,) LIQD Place 50 mL/hr into feeding tube continuous. Patient not taking: Reported on 11/29/2020 11/04/20   Mercy Riding, MD  senna (SENOKOT) 8.6 MG TABS tablet Take 1 tablet (8.6 mg total) by mouth 2 (two) times daily. Patient not taking: Reported on 11/29/2020 11/23/20   Angiulli, Lavon Paganini, PA-C  Zinc  Oxide (TRIPLE PASTE) 12.8 % ointment Apply topically 2 (two) times daily. Patient not taking:  Reported on 11/29/2020 11/23/20   Angiulli, Lavon Paganini, PA-C    Allergies    Crestor [rosuvastatin], Lactose intolerance (gi), Penicillins, and Imuran [azathioprine]  Review of Systems   Review of Systems  Constitutional:       Per HPI, otherwise negative  HENT:       Per HPI, otherwise negative  Respiratory:       Per HPI, otherwise negative  Cardiovascular:       Per HPI, otherwise negative  Gastrointestinal: Negative for vomiting.  Endocrine:       Negative aside from HPI  Genitourinary:       Neg aside from HPI   Musculoskeletal:       Per HPI, otherwise negative  Skin: Positive for color change.  Neurological: Negative for syncope.    Physical Exam Updated Vital Signs BP 94/61   Pulse 84   Temp 98.3 F (36.8 C) (Oral)   Resp (!) 22   Ht 5\' 4"  (1.626 m)   Wt 39 kg   SpO2 99%   BMI 14.76 kg/m   Physical Exam Vitals and nursing note reviewed.  Constitutional:      Comments: Sickly appearing elderly female awake and alert speaking clearly  HENT:     Head: Normocephalic and atraumatic.  Eyes:     Conjunctiva/sclera: Conjunctivae normal.  Cardiovascular:     Rate and Rhythm: Normal rate and regular rhythm.  Pulmonary:     Effort: Pulmonary effort is normal. No respiratory distress.     Breath sounds: Normal breath sounds. No stridor.  Abdominal:     General: There is no distension.    Skin:    General: Skin is warm and dry.  Neurological:     Mental Status: She is alert and oriented to person, place, and time.     Cranial Nerves: No cranial nerve deficit.     ED Results / Procedures / Treatments   Labs (all labs ordered are listed, but only abnormal results are displayed) Labs Reviewed - No data to display  EKG None  Radiology DG ABDOMEN PEG TUBE LOCATION  Result Date: 11/29/2020 CLINICAL DATA:  Dislodged PEG tube, evaluate placement. EXAM: ABDOMEN - 1  VIEW COMPARISON:  11/16/2020 FINDINGS: Patient's PEG tube was injected with contrast. The PEG tube tip and balloon lie within the mid stomach. No extraluminal contrast is noted. Bowel gas pattern is unremarkable. IMPRESSION: PEG tube tip and balloon within the mid stomach. No extraluminal contrast. Electronically Signed   By: Margarette Canada M.D.   On: 11/29/2020 09:04    Procedures Procedures   Medications Ordered in ED Medications  diatrizoate meglumine-sodium (GASTROGRAFIN) 66-10 % solution (has no administration in time range)  diatrizoate meglumine-sodium (GASTROGRAFIN) 66-10 % solution 30 mL (has no administration in time range)    ED Course  I have reviewed the triage vital signs and the nursing notes.  Pertinent labs & imaging results that were available during my care of the patient were reviewed by me and considered in my medical decision making (see chart for details). Date: Patient accompanied by companion.  She clarifies patient has had ongoing conversation with her surgeon, primary care team regarding necessity of feeding tube.   9:47 AM Are reviewed and patient is in no distress, more awake than on arrival.  We discussed x-ray results which I reviewed.  Tube is in place.  Patient without other complaints, without evidence of superficial infection, patient will have cleaning of her tube insertion site, wound care  provided, dressing provided, and will return to her nursing facility with plan for ongoing conversation with her primary care team regarding utility of the tube given the patient's preference to have this removed if appropriate.  Final Clinical Impression(s) / ED Diagnoses Final diagnoses:  PEG tube malfunction [facility])    Rx / [place] Orders ED Discharge Orders    None       Carmin Muskrat, MD 11/29/20 [phone]

## 2020-11-29 NOTE — Discharge Instructions (Addendum)
As discussed, it is important that you continue the conversation with your primary care team regarding the necessity of your feeding tube.  Today the evaluation was reassuring, the tube is in the appropriate place in your stomach.  There is some surrounding inflammatory fluid, but no evidence for infection.  If you develop new, or concerning changes, do not hesitate to return here for additional evaluation.  Otherwise follow-up with your physician.

## 2020-11-29 NOTE — ED Notes (Signed)
Attempted to call report again to [facility] place, they will call back

## 2020-11-29 NOTE — ED Notes (Signed)
[facility] at bedside to transport pt

## 2020-12-01 ENCOUNTER — Inpatient Hospital Stay: Payer: Medicare Other | Admitting: Pulmonary Disease

## 2020-12-01 DIAGNOSIS — Z931 Gastrostomy status: Secondary | ICD-10-CM | POA: Diagnosis not present

## 2020-12-01 DIAGNOSIS — J8489 Other specified interstitial pulmonary diseases: Secondary | ICD-10-CM | POA: Diagnosis not present

## 2020-12-01 DIAGNOSIS — G8929 Other chronic pain: Secondary | ICD-10-CM | POA: Diagnosis not present

## 2020-12-01 DIAGNOSIS — E46 Unspecified protein-calorie malnutrition: Secondary | ICD-10-CM | POA: Diagnosis not present

## 2020-12-01 DIAGNOSIS — I10 Essential (primary) hypertension: Secondary | ICD-10-CM | POA: Diagnosis not present

## 2020-12-01 DIAGNOSIS — R64 Cachexia: Secondary | ICD-10-CM | POA: Diagnosis not present

## 2020-12-01 DIAGNOSIS — Z86711 Personal history of pulmonary embolism: Secondary | ICD-10-CM | POA: Diagnosis not present

## 2020-12-01 DIAGNOSIS — T8189XA Other complications of procedures, not elsewhere classified, initial encounter: Secondary | ICD-10-CM | POA: Diagnosis not present

## 2020-12-01 DIAGNOSIS — L03311 Cellulitis of abdominal wall: Secondary | ICD-10-CM | POA: Diagnosis not present

## 2020-12-01 DIAGNOSIS — J9610 Chronic respiratory failure, unspecified whether with hypoxia or hypercapnia: Secondary | ICD-10-CM | POA: Diagnosis not present

## 2020-12-01 DIAGNOSIS — R2681 Unsteadiness on feet: Secondary | ICD-10-CM | POA: Diagnosis not present

## 2020-12-01 DIAGNOSIS — M6281 Muscle weakness (generalized): Secondary | ICD-10-CM | POA: Diagnosis not present

## 2020-12-01 DIAGNOSIS — D649 Anemia, unspecified: Secondary | ICD-10-CM | POA: Diagnosis not present

## 2020-12-01 DIAGNOSIS — E782 Mixed hyperlipidemia: Secondary | ICD-10-CM | POA: Diagnosis not present

## 2020-12-01 DIAGNOSIS — L89152 Pressure ulcer of sacral region, stage 2: Secondary | ICD-10-CM | POA: Diagnosis not present

## 2020-12-01 DIAGNOSIS — R5381 Other malaise: Secondary | ICD-10-CM | POA: Diagnosis not present

## 2020-12-03 ENCOUNTER — Emergency Department (HOSPITAL_COMMUNITY)
Admission: EM | Admit: 2020-12-03 | Discharge: 2020-12-03 | Disposition: A | Discharge status: Home / Self Care | Payer: Medicare Other | Attending: Emergency Medicine | Admitting: Emergency Medicine

## 2020-12-03 ENCOUNTER — Other Ambulatory Visit: Payer: Self-pay

## 2020-12-03 ENCOUNTER — Encounter (HOSPITAL_COMMUNITY): Payer: Self-pay | Admitting: Emergency Medicine

## 2020-12-03 DIAGNOSIS — I5032 Chronic diastolic (congestive) heart failure: Secondary | ICD-10-CM | POA: Diagnosis not present

## 2020-12-03 DIAGNOSIS — I2510 Atherosclerotic heart disease of native coronary artery without angina pectoris: Secondary | ICD-10-CM | POA: Insufficient documentation

## 2020-12-03 DIAGNOSIS — E46 Unspecified protein-calorie malnutrition: Secondary | ICD-10-CM | POA: Diagnosis not present

## 2020-12-03 DIAGNOSIS — Z87891 Personal history of nicotine dependence: Secondary | ICD-10-CM | POA: Insufficient documentation

## 2020-12-03 DIAGNOSIS — K9423 Gastrostomy malfunction: Secondary | ICD-10-CM | POA: Diagnosis not present

## 2020-12-03 DIAGNOSIS — Z96653 Presence of artificial knee joint, bilateral: Secondary | ICD-10-CM | POA: Insufficient documentation

## 2020-12-03 DIAGNOSIS — I110 Hypertensive heart disease with heart failure: Secondary | ICD-10-CM | POA: Diagnosis not present

## 2020-12-03 DIAGNOSIS — T8189XA Other complications of procedures, not elsewhere classified, initial encounter: Secondary | ICD-10-CM | POA: Diagnosis not present

## 2020-12-03 NOTE — ED Provider Notes (Signed)
[facility] EMERGENCY DEPARTMENT Provider Note   CSN: [id number] Arrival date & time: 12/03/20  0102     History Chief Complaint  Patient presents with  . PEG Tube Dislodged    [name] [name] is a 87 y.o. female who presents the emergency department by EMS from [facility] for PEG tube malfunction.  EMS initially stated that staff reported that her PEG tube had dislodged this evening and she was sent here for replacement.  On my evaluation, the patient has no complaints, including abdominal pain, fevers, chills, increased pain around PEG tube site.  No vomiting, chest pain, shortness of breath, or other associated symptoms.  Spoke with [facility] staff, who reports that the patient was actually sent out for a clogged PEG tube.  Staff was unable to administer her nighttime medications via PEG tube tonight.  Staff reports that she only uses the PEG tube from 6 pm-6 am and can tolerate oral intake the remaining 12 hours of the day.  Per chart review, her surgical team was consulted during her last ER visit to discuss a PEG tube was still necessary.  The history is provided by the patient, the nursing home, the EMS personnel and medical records. No language interpreter was used.       Past Medical History:  Diagnosis Date  . Allergic rhinitis   . Allergy   . CAD (coronary artery disease)    Mild CAD by cath 2008  . CHF (congestive heart failure) ([place])   . DJD (degenerative joint disease)    rheumatoid  . DVT (deep venous thrombosis) ([place])   . Dyslipidemia   . History of echocardiogram    Echo 12/2018: EF 60-65, mod asymmetric LVH, Gr 1 DD  . History of nuclear stress test    Myoview 5/17:  EF 74%, normal perfusion, low risk // Myoview 12/2018:  EF 89, very mild ischemia in inferoapical wall; Low Risk  . History of pulmonary embolism   . Hyperlipidemia   . Insomnia   . Osteoporosis   . Psoriasis   . Pulmonary fibrosis ([place])   . Rheumatoid arthritis  [facility])     Patient Active Problem List   Diagnosis Date Noted  . Debility 11/04/2020  . Dysphagia   . Oral thrush   . Encounter for dental examination   . Facial swelling   . Atrophy of edentulous alveolar ridge   . Abdominal wall cellulitis 10/24/2020  . Severe sepsis ([place]) 10/23/2020  . Abdominal pain 10/20/2020  . Diarrhea 10/02/2020  . Pressure injury of skin 09/06/2020  . Acute on chronic respiratory failure with hypoxemia ([place]) 08/23/2020  . Protein-calorie malnutrition, severe 08/20/2020  . Fall 01/25/2020  . Other fatigue 01/03/2020  . Left shoulder pain 01/03/2020  . Urinary frequency 01/03/2020  . Acute otalgia, left 09/06/2019  . Unintentional weight loss 07/18/2019  . Personal history of PE (pulmonary embolism) 04/23/2019  . Headache 02/11/2019  . Iron deficiency anemia 12/21/2018  . Cold sore 10/23/2018  . Blood in stool 09/14/2018  . 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 Therapeutic drug monitoring 07/17/2018  . Chronic diastolic CHF (congestive heart failure) ([place]) 12/19/2017  . Rash 08/11/2017  . Chronic respiratory failure with hypoxia ([place]) 03/30/2017  . Angular cheilitis 03/08/2017  . Leg pain 10/25/2016  . Back pain 08/05/2016  . RA (rheumatoid arthritis) ([place]) 03/31/2015  . Allergic rhinitis   . Varicose vein 09/11/2014  . ILD (interstitial lung disease) ([place]) 06/25/2012  . Chest pain  02/27/2012  . CAD (coronary artery disease) 02/27/2012  . Pruritus 08/18/2009  . Postinflammatory pulmonary fibrosis / RA ILD  06/30/2008  . Constipation 01/03/2008  . Dyslipidemia 06/16/2007    Past Surgical History:  Procedure Laterality Date  . ABDOMINAL HYSTERECTOMY  1974  . APPENDECTOMY  1959  . CATARACT EXTRACTION    . IR GJ TUBE CHANGE  11/05/2020  . IR REPLC GASTRO/COLONIC TUBE PERCUT W/FLUORO  10/29/2020  . IR REPLC GASTRO/COLONIC TUBE PERCUT W/FLUORO  11/17/2020  . LAPAROTOMY N/A 08/23/2020   Procedure: EXPLORATORY LAPAROTOMY small bowel resection gastrostomy  tube placement;  Surgeon: Dwan Bolt, MD;  Location: [facility] ORS;  Service: General;  Laterality: N/A;  . LEFT HEART CATHETERIZATION WITH CORONARY ANGIOGRAM N/A 06/18/2013   Procedure: LEFT HEART CATHETERIZATION WITH CORONARY ANGIOGRAM;  Surgeon: Peter M Martinique, MD;  Location: [facility] CATH LAB;  Service: Cardiovascular;  Laterality: N/A;  . TONSILLECTOMY  1941, 1951  . TOTAL KNEE ARTHROPLASTY  1997, 2007   Bilateral     OB History    Gravida  4   Para  2   Term  2   Preterm      AB      Living  0     SAB      IAB      Ectopic      Multiple      Live Births              Family History  Problem Relation Age of Onset  . Kidney disease Daughter 5  . Cancer Daughter   . Heart attack Father 54  . Alzheimer's disease Sister   . Heart disease Sister   . Alzheimer's disease Sister     Social History   Tobacco Use  . Smoking status: Former Smoker    Packs/day: 0.50    Years: 20.00    Pack years: 10.00    Types: Cigarettes    Quit date: 07/11/1973    Years since quitting: 47.4  . Smokeless tobacco: Never Used  Vaping Use  . Vaping Use: Never used  Substance Use Topics  . Alcohol use: No  . Drug use: No    Home Medications Prior to Admission medications   Medication Sig Start Date End Date Taking? Authorizing Provider  acetaminophen (TYLENOL) 325 MG tablet Take 2 tablets (650 mg total) by mouth every 6 (six) hours as needed for mild pain, fever or headache (or Fever >/= 101). Patient taking differently: Place 650 mg into feeding tube every 6 (six) hours as needed for mild pain, fever or headache (or Fever >/= 101). 11/04/20   Gonfa, Charlesetta Ivory, MD  benzocaine (ORAJEL) 10 % mucosal gel Use as directed in the mouth or throat as needed for mouth pain. 11/23/20   Angiulli, Lavon Paganini, PA-C  chlorhexidine (PERIDEX) 0.12 % solution Use as directed 15 mLs in the mouth or throat 4 (four) times daily. 11/23/20   Angiulli, Lavon Paganini, PA-C  dronabinol (MARINOL) 2.5 MG capsule Take 1  capsule (2.5 mg total) by mouth 2 (two) times daily before lunch and supper. Patient not taking: Reported on 11/29/2020 11/23/20   Angiulli, Lavon Paganini, PA-C  famotidine (PEPCID) 20 MG tablet Place 20 mg into feeding tube daily.    [provider]  famotidine (PEPCID) 40 MG/5ML suspension Take 2.5 mLs (20 mg total) by mouth daily. Patient not taking: No sig reported 11/23/20   Angiulli, Lavon Paganini, PA-C  fluticasone [facility]) 50 MCG/ACT nasal spray  Place 2 sprays into both nostrils daily as needed for allergies or rhinitis. 03/17/15   Hoyt Koch, MD  gabapentin (NEURONTIN) 300 MG capsule Take 1 capsule (300 mg total) by mouth 3 (three) times daily. Patient taking differently: Place 300 mg into feeding tube 3 (three) times daily. 11/23/20   Angiulli, Lavon Paganini, PA-C  lactose free nutrition (BOOST) LIQD Place 1 Container into feeding tube in the morning and at bedtime.    [provider]  Lidocaine 4 % PTCH Apply 1 patch topically See admin instructions. Apply to right forearm leave on for 12 hours then remove    [provider]  lipase/protease/amylase (CREON) 36000 UNITS CPEP capsule Take 1 capsule (36,000 Units total) by mouth 3 (three) times daily before meals. Patient taking differently: 36,000 Units 3 (three) times daily before meals. Via tube 10/09/20   Hoyt Koch, MD  midodrine (PROAMATINE) 5 MG tablet Take 1 tablet (5 mg total) by mouth 3 (three) times daily with meals. Patient taking differently: Place 5 mg into feeding tube 3 (three) times daily with meals. 11/23/20   Angiulli, Lavon Paganini, PA-C  mirtazapine (REMERON SOL-TAB) 30 MG disintegrating tablet Take 1 tablet (30 mg total) by mouth at bedtime. Patient not taking: No sig reported 11/23/20   Angiulli, Lavon Paganini, PA-C  mirtazapine (REMERON) 15 MG tablet Place 15 mg into feeding tube at bedtime.    [provider]  morphine (MS CONTIN) 15 MG 12 hr tablet Take 1 tablet (15 mg total) by mouth every  12 (twelve) hours. Patient taking differently: 15 mg every 12 (twelve) hours. Via tube 11/23/20   Angiulli, Lavon Paganini, PA-C  Multiple Vitamin (MULTIVITAMIN WITH MINERALS) TABS tablet Take 1 tablet by mouth daily. Patient taking differently: Place 1 tablet into feeding tube daily. 11/05/20   Mercy Riding, MD  mycophenolate (CELLCEPT) 500 MG tablet 2 tabs bid Patient taking differently: Place 1,000 mg into feeding tube 2 (two) times daily. 01/27/20   Charlynne Cousins, MD  Nintedanib (OFEV) 100 MG CAPS Take 1 capsule (100 mg total) by mouth 2 (two) times daily. Patient taking differently: Place 100 mg into feeding tube 2 (two) times daily. 08/18/20   Mannam, Hart Robinsons, MD  nitroGLYCERIN (NITROSTAT) 0.3 MG SL tablet Place 0.3 mg under the tongue every 5 (five) minutes as needed for chest pain.    [provider]  nitroGLYCERIN (NITROSTAT) 0.4 MG SL tablet Place 1 tablet (0.4 mg total) under the tongue every 5 (five) minutes as needed for chest pain. Patient not taking: Reported on 11/29/2020 06/29/20 09/27/20  Burnell Blanks, MD  Nutritional Supplements (ENSURE ORIGINAL PO) 237 mLs by PEG Tube route daily.    [provider]  Nutritional Supplements (FEEDING SUPPLEMENT, OSMOLITE 1.2 CAL,) LIQD Place 50 mL/hr into feeding tube continuous. Patient not taking: Reported on 11/29/2020 11/04/20   Mercy Riding, MD  nystatin (MYCOSTATIN/NYSTOP) powder Apply 1 application topically See admin instructions. Apply qd around peg of circle    [provider]  oxyCODONE (OXY IR/ROXICODONE) 5 MG immediate release tablet Take 1 tablet (5 mg total) by mouth every 4 (four) hours as needed for severe pain. Patient taking differently: Place 5 mg into feeding tube every 4 (four) hours as needed for severe pain. 11/23/20   Angiulli, Lavon Paganini, PA-C  polyethylene glycol (MIRALAX / GLYCOLAX) 17 g packet Take 17 g by mouth daily. Patient taking differently: Place 17 g into feeding tube daily. 11/23/20    [place],  Lavon Paganini, PA-C  predniSONE (DELTASONE) 5 MG tablet TAKE 1 TABLET DAILY WITH BREAKFAST Patient taking differently: Place 5 mg into feeding tube daily with breakfast. 05/08/20   Mannam, Praveen, MD  senna (SENOKOT) 8.6 MG TABS tablet Take 1 tablet (8.6 mg total) by mouth 2 (two) times daily. Patient not taking: Reported on 11/29/2020 11/23/20   Angiulli, Lavon Paganini, PA-C  Zinc Oxide (TRIPLE PASTE) 12.8 % ointment Apply topically 2 (two) times daily. Patient not taking: Reported on 11/29/2020 11/23/20   Angiulli, Lavon Paganini, PA-C    Allergies    Crestor [rosuvastatin], Lactose intolerance (gi), Penicillins, and Imuran [azathioprine]  Review of Systems   Review of Systems  Constitutional: Negative for activity change.  Respiratory: Negative for shortness of breath.   Cardiovascular: Negative for chest pain.  Gastrointestinal: Negative for abdominal pain, constipation, diarrhea, nausea and vomiting.  Genitourinary: Negative for dysuria.  Musculoskeletal: Negative for back pain.  Skin: Positive for wound. Negative for color change and rash.  Allergic/Immunologic: Negative for immunocompromised state.  Neurological: Negative for headaches.  Psychiatric/Behavioral: Negative for confusion.    Physical Exam Updated Vital Signs BP 109/66   Pulse 70   Temp (!) 97.2 F (36.2 C) (Oral)   Resp 15   SpO2 100%   Physical Exam Vitals and nursing note reviewed.  Constitutional:      General: She is not in acute distress.    Comments: Chronically ill-appearing elderly female.  No acute distress.  Pleasant.  HENT:     Head: Normocephalic.  Eyes:     Conjunctiva/sclera: Conjunctivae normal.  Cardiovascular:     Rate and Rhythm: Normal rate and regular rhythm.     Heart sounds: No murmur heard. No friction rub. No gallop.   Pulmonary:     Effort: Pulmonary effort is normal. No respiratory distress.  Abdominal:     General: There is no distension.     Palpations: Abdomen is soft.      Comments: Multiple healing wounds noted to the mid abdomen.  Abdomen is soft and nontender.  PEG tube is in place in the left lower quadrant and dressings are covering the bilateral lower abdomen.  No tenderness to palpation around the PEG tube.  PEG tubing filled with a tan substance.   Musculoskeletal:     Cervical back: Neck supple.  Skin:    General: Skin is warm.     Findings: No rash.  Neurological:     Mental Status: She is alert.  Psychiatric:        Behavior: Behavior normal.     ED Results / Procedures / Treatments   Labs (all labs ordered are listed, but only abnormal results are displayed) Labs Reviewed - No data to display  EKG None  Radiology No results found.  Procedures Procedures   Medications Ordered in ED Medications - No data to display  ED Course  I have reviewed the triage vital signs and the nursing notes.  Pertinent labs & imaging results that were available during my care of the patient were reviewed by me and considered in my medical decision making (see chart for details).    MDM Rules/Calculators/A&P                          87 year old female with chronic medical conditions as listed above who presents the emergency department by EMS from [facility] for PEG tube malfunction.  Initially, there was concern that the patient's PEG  tube had fallen out, but after speaking with staff at [facility], the patient was actually sent out due to a clogged PEG tube.  Vital signs are stable.  Abdominal exam is benign.  She has no tenderness to palpation around the PEG tube, which is actively in place.  There is a tan substance, which is in the tube, but the tube is not completely occluded.  The PEG tube was flushed and irrigated at bedside by myself and RN multiple times with ease.  The patient had no pain associated with flushing PEG tube.  There is no evidence of erythema or warmth on my exam.  Patient is resting comfortably and is otherwise in no acute  distress with no complaints.  The patient was discussed with Dr. Dayna Barker, attending physician.  Since PEG tube is in place, and the patient has no other concerns or pain associated there is no indication for X-ray for PEG tube placement.  At this time, PEG tube is functioning.  She is safe for [facility] to [facility].  She can follow-up with her surgical team as needed.  ER return precautions given.  Final Clinical Impression(s) / ED Diagnoses Final diagnoses:  PEG tube malfunction [facility])    Rx / DC Orders ED Discharge Orders    None       Joanne Gavel, PA-C 12/03/20 0234    Mesner, Corene Cornea, MD 12/03/20 [phone]

## 2020-12-03 NOTE — Discharge Instructions (Addendum)
Thank you for allowing me to care for you today in the Emergency Department.   Your PEG tube was in the proper location.  It was flushed multiple times without difficulty.   Please follow-up with your surgical team to determine if you need to continue the PEG tube.  They can also help coordinate a replacement if there continues to be issues.  Return to the emergency department if you develop severe pain around her PEG tube site, fevers, drainage from the area, or other new, concerning symptoms.

## 2020-12-03 NOTE — ED Triage Notes (Signed)
Patient arrived with EMS from [facility] , staff reported her PEG dislodged this evening sent here for replacement .

## 2020-12-03 NOTE — ED Notes (Signed)
[facility] called  

## 2020-12-03 NOTE — ED Notes (Signed)
Report given to [facility] on patient's discharge , [facility] notified by Network engineer for transport.

## 2020-12-04 ENCOUNTER — Telehealth: Payer: Self-pay | Admitting: Adult Health Nurse Practitioner

## 2020-12-04 DIAGNOSIS — E46 Unspecified protein-calorie malnutrition: Secondary | ICD-10-CM | POA: Diagnosis not present

## 2020-12-04 NOTE — Telephone Encounter (Signed)
Spoke with sister, Meredith Mody, to discuss her sister's condition and recommendation for hospice.  She wanted to set up appt to meet as a family with her brother and sister as well.  Set up appt. For 2p on 12/08/20 at the facility.   Dashay Giesler K. Olena Heckle NP

## 2020-12-06 ENCOUNTER — Encounter (HOSPITAL_COMMUNITY): Payer: Self-pay

## 2020-12-06 ENCOUNTER — Other Ambulatory Visit: Payer: Self-pay

## 2020-12-06 ENCOUNTER — Emergency Department (HOSPITAL_COMMUNITY)
Admission: EM | Admit: 2020-12-06 | Discharge: 2020-12-06 | Disposition: A | Discharge status: Home / Self Care | Payer: Medicare Other | Attending: Emergency Medicine | Admitting: Emergency Medicine

## 2020-12-06 DIAGNOSIS — Z87891 Personal history of nicotine dependence: Secondary | ICD-10-CM | POA: Diagnosis not present

## 2020-12-06 DIAGNOSIS — I5032 Chronic diastolic (congestive) heart failure: Secondary | ICD-10-CM | POA: Insufficient documentation

## 2020-12-06 DIAGNOSIS — Z96653 Presence of artificial knee joint, bilateral: Secondary | ICD-10-CM | POA: Diagnosis not present

## 2020-12-06 DIAGNOSIS — K9423 Gastrostomy malfunction: Secondary | ICD-10-CM | POA: Diagnosis not present

## 2020-12-06 DIAGNOSIS — Z9861 Coronary angioplasty status: Secondary | ICD-10-CM | POA: Diagnosis not present

## 2020-12-06 DIAGNOSIS — I2510 Atherosclerotic heart disease of native coronary artery without angina pectoris: Secondary | ICD-10-CM | POA: Diagnosis not present

## 2020-12-06 LAB — CBC WITH DIFFERENTIAL/PLATELET
Abs Immature Granulocytes: 0.14 10*3/uL — ABNORMAL HIGH (ref 0.00–0.07)
Basophils Absolute: 0.1 10*3/uL (ref 0.0–0.1)
Basophils Relative: 0 %
Eosinophils Absolute: 0.1 10*3/uL (ref 0.0–0.5)
Eosinophils Relative: 1 %
HCT: 36.1 % (ref 36.0–46.0)
Hemoglobin: 11.4 g/dL — ABNORMAL LOW (ref 12.0–15.0)
Immature Granulocytes: 1 %
Lymphocytes Relative: 29 %
Lymphs Abs: 4.2 10*3/uL — ABNORMAL HIGH (ref 0.7–4.0)
MCH: 32.1 pg (ref 26.0–34.0)
MCHC: 31.6 g/dL (ref 30.0–36.0)
MCV: 101.7 fL — ABNORMAL HIGH (ref 80.0–100.0)
Monocytes Absolute: 0.8 10*3/uL (ref 0.1–1.0)
Monocytes Relative: 6 %
Neutro Abs: 9.1 10*3/uL — ABNORMAL HIGH (ref 1.7–7.7)
Neutrophils Relative %: 63 %
Platelets: 304 10*3/uL (ref 150–400)
RBC: 3.55 MIL/uL — ABNORMAL LOW (ref 3.87–5.11)
RDW: 15.6 % — ABNORMAL HIGH (ref 11.5–15.5)
WBC: 14.3 10*3/uL — ABNORMAL HIGH (ref 4.0–10.5)
nRBC: 0 % (ref 0.0–0.2)

## 2020-12-06 LAB — COMPREHENSIVE METABOLIC PANEL
ALT: 16 U/L (ref 0–44)
AST: 28 U/L (ref 15–41)
Albumin: 2.4 g/dL — ABNORMAL LOW (ref 3.5–5.0)
Alkaline Phosphatase: 87 U/L (ref 38–126)
Anion gap: 7 (ref 5–15)
BUN: 11 mg/dL (ref 8–23)
CO2: 32 mmol/L (ref 22–32)
Calcium: 8.6 mg/dL — ABNORMAL LOW (ref 8.9–10.3)
Chloride: 92 mmol/L — ABNORMAL LOW (ref 98–111)
Creatinine, Ser: 0.6 mg/dL (ref 0.44–1.00)
GFR, Estimated: >60 mL/min (ref >60)
Glucose, Bld: 94 mg/dL (ref 70–99)
Potassium: 4.1 mmol/L (ref 3.5–5.1)
Sodium: 131 mmol/L — ABNORMAL LOW (ref 135–145)
Total Bilirubin: 0.5 mg/dL (ref 0.3–1.2)
Total Protein: 6.8 g/dL (ref 6.5–8.1)

## 2020-12-06 LAB — LIPASE, BLOOD: Lipase: 25 U/L (ref 11–51)

## 2020-12-06 MED ORDER — MORPHINE SULFATE 15 MG PO TABS
15.0000 mg | ORAL_TABLET | Freq: Once | ORAL | Status: AC
  Administered 2020-12-06: 15 mg
  Filled 2020-12-06 (×2): qty 1

## 2020-12-06 MED ORDER — MORPHINE SULFATE (PF) 4 MG/ML IV SOLN
4.0000 mg | Freq: Once | INTRAVENOUS | Status: DC
Start: 2020-12-06 — End: 2020-12-07

## 2020-12-06 MED ORDER — MORPHINE SULFATE 10 MG/5ML PO SOLN: 15.0000 mg | Freq: Once | ORAL | Status: DC

## 2020-12-06 NOTE — ED Notes (Signed)
[facility] has been called for transport, communications indicates that the pt is next on the list to be picked up.

## 2020-12-06 NOTE — ED Provider Notes (Signed)
[facility] EMERGENCY DEPARTMENT Provider Note   CSN: [id number] Arrival date & time:        History Chief Complaint  Patient presents with  . Abdominal Pain  . Peg tube problems    [name] [name] is a 87 y.o. female.  She has a history of small bowel obstruction with resection, chronic abdominal wounds, PEG tube for supplemental feedings.  Malnutrition.  She is sent from her facility for evaluation of leaking from her PEG tube site when flushed.  Patient herself denies any abdominal pain.  No fevers chest pain or cough.  She had a prolonged admission last month for same with some poorly healing wounds requiring antibiotics.  She was discharged on oxygen.  She has been seen twice more for G-tube problems.  The history is provided by the patient and the EMS personnel.  Abdominal Pain Associated symptoms: no chest pain, no dysuria, no fever, no nausea, no shortness of breath, no sore throat and no vomiting        Past Medical History:  Diagnosis Date  . Allergic rhinitis   . Allergy   . CAD (coronary artery disease)    Mild CAD by cath 2008  . CHF (congestive heart failure) ([place])   . DJD (degenerative joint disease)    rheumatoid  . DVT (deep venous thrombosis) ([place])   . Dyslipidemia   . History of echocardiogram    Echo 12/2018: EF 60-65, mod asymmetric LVH, Gr 1 DD  . History of nuclear stress test    Myoview 5/17:  EF 74%, normal perfusion, low risk // Myoview 12/2018:  EF 89, very mild ischemia in inferoapical wall; Low Risk  . History of pulmonary embolism   . Hyperlipidemia   . Insomnia   . Osteoporosis   . Psoriasis   . Pulmonary fibrosis ([place])   . Rheumatoid arthritis [facility])     Patient Active Problem List   Diagnosis Date Noted  . Debility 11/04/2020  . Dysphagia   . Oral thrush   . Encounter for dental examination   . Facial swelling   . Atrophy of edentulous alveolar ridge   . Abdominal wall cellulitis 10/24/2020  . Severe sepsis ([place])  10/23/2020  . Abdominal pain 10/20/2020  . Diarrhea 10/02/2020  . Pressure injury of skin 09/06/2020  . Acute on chronic respiratory failure with hypoxemia ([place]) 08/23/2020  . Protein-calorie malnutrition, severe 08/20/2020  . Fall 01/25/2020  . Other fatigue 01/03/2020  . Left shoulder pain 01/03/2020  . Urinary frequency 01/03/2020  . Acute otalgia, left 09/06/2019  . Unintentional weight loss 07/18/2019  . Personal history of PE (pulmonary embolism) 04/23/2019  . Headache 02/11/2019  . Iron deficiency anemia 12/21/2018  . Cold sore 10/23/2018  . Blood in stool 09/14/2018  . 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 Therapeutic drug monitoring 07/17/2018  . Chronic diastolic CHF (congestive heart failure) ([place]) 12/19/2017  . Rash 08/11/2017  . Chronic respiratory failure with hypoxia ([place]) 03/30/2017  . Angular cheilitis 03/08/2017  . Leg pain 10/25/2016  . Back pain 08/05/2016  . RA (rheumatoid arthritis) ([place]) 03/31/2015  . Allergic rhinitis   . Varicose vein 09/11/2014  . ILD (interstitial lung disease) ([place]) 06/25/2012  . Chest pain 02/27/2012  . CAD (coronary artery disease) 02/27/2012  . Pruritus 08/18/2009  . Postinflammatory pulmonary fibrosis / RA ILD  06/30/2008  . Constipation 01/03/2008  . Dyslipidemia 06/16/2007    Past Surgical History:  Procedure Laterality Date  . ABDOMINAL HYSTERECTOMY  [place]  . CATARACT EXTRACTION    . IR GJ TUBE CHANGE  11/05/2020  . IR REPLC GASTRO/COLONIC TUBE PERCUT W/FLUORO  10/29/2020  . IR REPLC GASTRO/COLONIC TUBE PERCUT W/FLUORO  11/17/2020  . LAPAROTOMY N/A 08/23/2020   Procedure: EXPLORATORY LAPAROTOMY small bowel resection gastrostomy tube placement;  Surgeon: Dwan Bolt, MD;  Location: [facility] ORS;  Service: General;  Laterality: N/A;  . LEFT HEART CATHETERIZATION WITH CORONARY ANGIOGRAM N/A 06/18/2013   Procedure: LEFT HEART CATHETERIZATION WITH CORONARY ANGIOGRAM;  Surgeon: Peter M Martinique, MD;  Location: [facility] CATH  LAB;  Service: Cardiovascular;  Laterality: N/A;  . TONSILLECTOMY  1941, 1951  . TOTAL KNEE ARTHROPLASTY  1997, 2007   Bilateral     OB History    Gravida  4   Para  2   Term  2   Preterm      AB      Living  0     SAB      IAB      Ectopic      Multiple      Live Births              Family History  Problem Relation Age of Onset  . Kidney disease Daughter 5  . Cancer Daughter   . Heart attack Father 40  . Alzheimer's disease Sister   . Heart disease Sister   . Alzheimer's disease Sister     Social History   Tobacco Use  . Smoking status: Former Smoker    Packs/day: 0.50    Years: 20.00    Pack years: 10.00    Types: Cigarettes    Quit date: 07/11/1973    Years since quitting: 47.4  . Smokeless tobacco: Never Used  Vaping Use  . Vaping Use: Never used  Substance Use Topics  . Alcohol use: No  . Drug use: No    Home Medications Prior to Admission medications   Medication Sig Start Date End Date Taking? Authorizing Provider  acetaminophen (TYLENOL) 325 MG tablet Take 2 tablets (650 mg total) by mouth every 6 (six) hours as needed for mild pain, fever or headache (or Fever >/= 101). Patient taking differently: Place 650 mg into feeding tube every 6 (six) hours as needed for mild pain, fever or headache (or Fever >/= 101). 11/04/20   Gonfa, Charlesetta Ivory, MD  benzocaine (ORAJEL) 10 % mucosal gel Use as directed in the mouth or throat as needed for mouth pain. Patient taking differently: Use as directed 1 application in the mouth or throat 4 (four) times daily as needed for mouth pain. 11/23/20   Angiulli, Lavon Paganini, PA-C  chlorhexidine (PERIDEX) 0.12 % solution Use as directed 15 mLs in the mouth or throat 4 (four) times daily. 11/23/20   Angiulli, Lavon Paganini, PA-C  dronabinol (MARINOL) 2.5 MG capsule Take 1 capsule (2.5 mg total) by mouth 2 (two) times daily before lunch and supper. Patient not taking: Reported on 11/29/2020 11/23/20   Angiulli, Lavon Paganini, PA-C   famotidine (PEPCID) 20 MG tablet Place 20 mg into feeding tube daily.    [provider]  famotidine (PEPCID) 40 MG/5ML suspension Take 2.5 mLs (20 mg total) by mouth daily. Patient not taking: No sig reported 11/23/20   Angiulli, Lavon Paganini, PA-C  fluticasone [facility]) 50 MCG/ACT nasal spray Place 2 sprays into both nostrils daily as needed for allergies or rhinitis. 03/17/15   Hoyt Koch, MD  gabapentin (NEURONTIN)  300 MG capsule Take 1 capsule (300 mg total) by mouth 3 (three) times daily. Patient taking differently: Place 300 mg into feeding tube 3 (three) times daily. 11/23/20   Angiulli, Lavon Paganini, PA-C  lactose free nutrition (BOOST) LIQD Place 1 Container into feeding tube in the morning and at bedtime.    [provider]  Lidocaine 4 % PTCH Apply 1 patch topically See admin instructions. Apply to right forearm leave on for 12 hours then remove    [provider]  lipase/protease/amylase (CREON) 36000 UNITS CPEP capsule Take 1 capsule (36,000 Units total) by mouth 3 (three) times daily before meals. Patient taking differently: 36,000 Units 3 (three) times daily before meals. Via tube 10/09/20   Hoyt Koch, MD  midodrine (PROAMATINE) 5 MG tablet Take 1 tablet (5 mg total) by mouth 3 (three) times daily with meals. Patient taking differently: Place 5 mg into feeding tube 3 (three) times daily with meals. 11/23/20   Angiulli, Lavon Paganini, PA-C  mirtazapine (REMERON SOL-TAB) 30 MG disintegrating tablet Take 1 tablet (30 mg total) by mouth at bedtime. Patient not taking: No sig reported 11/23/20   Angiulli, Lavon Paganini, PA-C  mirtazapine (REMERON) 30 MG tablet Place 15 mg into feeding tube at bedtime.    [provider]  morphine (MS CONTIN) 15 MG 12 hr tablet Take 1 tablet (15 mg total) by mouth every 12 (twelve) hours. Patient taking differently: 15 mg every 12 (twelve) hours. Via tube 11/23/20   Angiulli, Lavon Paganini, PA-C  Multiple Vitamin (MULTIVITAMIN  WITH MINERALS) TABS tablet Take 1 tablet by mouth daily. Patient taking differently: Place 1 tablet into feeding tube daily. 11/05/20   Mercy Riding, MD  mycophenolate (CELLCEPT) 500 MG tablet 2 tabs bid Patient taking differently: Place 1,000 mg into feeding tube 2 (two) times daily. 01/27/20   Charlynne Cousins, MD  Nintedanib (OFEV) 100 MG CAPS Take 1 capsule (100 mg total) by mouth 2 (two) times daily. Patient taking differently: Place 100 mg into feeding tube 2 (two) times daily. 08/18/20   Mannam, Hart Robinsons, MD  nitroGLYCERIN (NITROSTAT) 0.3 MG SL tablet Place 0.3 mg under the tongue every 5 (five) minutes as needed for chest pain.    [provider]  nitroGLYCERIN (NITROSTAT) 0.4 MG SL tablet Place 1 tablet (0.4 mg total) under the tongue every 5 (five) minutes as needed for chest pain. Patient not taking: Reported on 12/03/2020 06/29/20 09/27/20  Burnell Blanks, MD  Nutritional Supplements (ENSURE ORIGINAL PO) 237 mLs by PEG Tube route daily.    [provider]  Nutritional Supplements (FEEDING SUPPLEMENT, OSMOLITE 1.2 CAL,) LIQD Place 50 mL/hr into feeding tube continuous. Patient not taking: Reported on 11/29/2020 11/04/20   Mercy Riding, MD  nystatin (MYCOSTATIN/NYSTOP) powder Apply 1 application topically See admin instructions. Apply qd around peg of circle    [provider]  oxyCODONE (OXY IR/ROXICODONE) 5 MG immediate release tablet Take 1 tablet (5 mg total) by mouth every 4 (four) hours as needed for severe pain. Patient taking differently: Place 5 mg into feeding tube every 4 (four) hours as needed for severe pain. 11/23/20   Angiulli, Lavon Paganini, PA-C  polyethylene glycol (MIRALAX / GLYCOLAX) 17 g packet Take 17 g by mouth daily. Patient taking differently: Place 17 g into feeding tube daily. 11/23/20   Angiulli, Lavon Paganini, PA-C  predniSONE (DELTASONE) 5 MG tablet TAKE 1 TABLET DAILY WITH BREAKFAST Patient taking differently: Place 5 mg into feeding  tube daily with breakfast. 05/08/20   Mannam, Hart Robinsons, MD  senna (SENOKOT) 8.6 MG TABS tablet Take 1 tablet (8.6 mg total) by mouth 2 (two) times daily. Patient not taking: Reported on 11/29/2020 11/23/20   Angiulli, Lavon Paganini, PA-C  Zinc Oxide (TRIPLE PASTE) 12.8 % ointment Apply topically 2 (two) times daily. Patient not taking: Reported on 11/29/2020 11/23/20   Cathlyn Parsons, PA-C    Allergies    Crestor [rosuvastatin], Lactose intolerance (gi), Penicillins, and Imuran [azathioprine]  Review of Systems   Review of Systems  Constitutional: Negative for fever.  HENT: Negative for sore throat.   Eyes: Negative for visual disturbance.  Respiratory: Negative for shortness of breath.   Cardiovascular: Negative for chest pain.  Gastrointestinal: Negative for abdominal pain, nausea and vomiting.  Genitourinary: Negative for dysuria.  Musculoskeletal: Negative for neck pain.  Skin: Positive for wound.  Neurological: Negative for headaches.    Physical Exam Updated Vital Signs BP 102/65 (BP Location: Left Arm)   Pulse 90   Temp 98.5 F (36.9 C) (Oral)   Resp 16   Ht 5\' 4"  (1.626 m)   Wt 39 kg   SpO2 100%   BMI 14.76 kg/m   Physical Exam Vitals and nursing note reviewed.  Constitutional:      General: She is not in acute distress.    Appearance: She is well-developed. She is cachectic.  HENT:     Head: Normocephalic and atraumatic.  Eyes:     Conjunctiva/sclera: Conjunctivae normal.  Cardiovascular:     Rate and Rhythm: Normal rate and regular rhythm.     Heart sounds: No murmur heard.   Pulmonary:     Effort: Pulmonary effort is normal. No respiratory distress.     Breath sounds: Normal breath sounds.  Abdominal:     Palpations: Abdomen is soft.     Tenderness: There is no abdominal tenderness. There is no guarding or rebound.     Comments: She has 2 abdominal wounds on her right side.  1 large and 1 small.  They both have dressings in place and there is no  surrounding erythema.  There is a G-tube left mid with an abdominal binder overlying this.  When taken down the G-tube has some yellow material around it that possibly his tube feeds.  There appears to be a stoma dressing overlying and the tissue looks somewhat macerated underneath although better than the last time I saw her.  Musculoskeletal:        General: No deformity or signs of injury.     Cervical back: Neck supple.     Right lower leg: No edema.     Left lower leg: No edema.  Skin:    General: Skin is warm and dry.  Neurological:     General: No focal deficit present.     Mental Status: She is alert.     ED Results / Procedures / Treatments   Labs (all labs ordered are listed, but only abnormal results are displayed) Labs Reviewed  COMPREHENSIVE METABOLIC PANEL - Abnormal; Notable for the following components:      Result Value   Sodium 131 (*)    Chloride 92 (*)    Calcium 8.6 (*)    Albumin 2.4 (*)    All other components within normal limits  CBC WITH DIFFERENTIAL/PLATELET - Abnormal; Notable for the following components:   WBC 14.3 (*)    RBC 3.55 (*)    Hemoglobin 11.4 (*)  MCV 101.7 (*)    RDW 15.6 (*)    Neutro Abs 9.1 (*)    Lymphs Abs 4.2 (*)    Abs Immature Granulocytes 0.14 (*)    All other components within normal limits  LIPASE, BLOOD    EKG None  Radiology No results found.  Procedures Procedures   Medications Ordered in ED Medications  morphine 4 MG/ML injection 4 mg (4 mg Intravenous Not Given 12/06/20 1906)  morphine (MSIR) tablet 15 mg (15 mg Per Tube Given 12/06/20 1858)    ED Course  I have reviewed the triage vital signs and the nursing notes.  Pertinent labs & imaging results that were available during my care of the patient were reviewed by me and considered in my medical decision making (see chart for details).  Clinical Course as of 12/06/20 2211  Sun Dec 06, 2020  1142 I flush the G-tube with some sterile saline without  any difficulty.  A small amount did leak back up from around the sides of the G-tube. [MB]  8250 Current wound care recommendations for peg tube management per last admission - L24B1 - Related to digestive stoma or fistula  Measurement: peri-PEG tube, diffuse erythema with scattered partial thickness areas of tissue loss Wound NLZ:JQBH, moist Drainage (amount, consistency, odor) small serous Periwound:erythema Dressing procedure/placement/frequency: I will discontinue the previously ordered POC in favor of using a moisture barrier ointment containing zinc oxide (triple Paste).  This will be topped with a silver hydrofiber (Aquacel Ag+ Advantage) fashioned as a split-gauze around the tube.  THis will be topped with an ABD pad and secured with paper tape.  The dressing is to be changed twice daily and PRN leakage of gastric contents onto the skin. [MB]  4193 The nurse reapplied a new Aquaphor barrier for her PEG area.  On review of prior wound consult notes it sounds like this has been a chronic problem of reflux of gastric contents through her stoma site.  No obvious infection.  PEG flushes easily.  We will check some screening labs. [MB]  7902 Patient's labs fairly close to baseline other than mild increase in her WBCs.  She endorses no abdominal pain and has no complaints.  Have cleaned up her PEG site dressing.  We will have her return to facility. [MB]  4097 One of the patient's power of attorney's is here now.  She wanted to make sure that the patient got her dose of pain medicine because it does not sound like she has had any since she has been here.  She also would like to recommend that hospice get reinvolved with the patient when she gets back to the facility.  Put that down in my paperwork for recommendation. [MB]  [id number] Patient has missed her chronic MS IR.  Pharmacy is having difficulty getting this medicine to [place].  I ordered her some IV morphine. [MB]    Clinical Course User Index [MB]  Hayden Rasmussen, MD   MDM Rules/Calculators/A&P                         This patient complains of leakage from PEG tube site, possible abdominal pain; this involves an extensive number of treatment Options and is a complaint that carries with it a high risk of complications and Morbidity. The differential includes obstruction, PEG tube clogged, malposition, metabolic derangement  I ordered, reviewed and interpreted labs, which included CBC with greater than baseline, hemoglobin stable, chemistries fairly  unremarkable similar to priors, LFTs and lipase normal, I ordered medication IV pain medication with improvement of patient's pain although difficult to say because she is not complaining of any pain Additional history obtained from EMS and patient's healthcare proxy Previous records obtained and reviewed in epic including recent admissions and prior ED visits for similar presentations  After the interventions stated above, I reevaluated the patient and found patient to be asymptomatic and continues to deny any abdominal pain.  She is on chronic pain medicines have ordered some pain medicine in the interim.  Her PEG tube appears to be functioning properly although does have some reflux from her stoma.  Prior wound care notes reflect same.  We will make some recommendations for wound care back at her facility.  Currently no indications for admission to the hospital or further imaging.  Return instructions discussed   Final Clinical Impression(s) / ED Diagnoses Final diagnoses:  Leaking PEG tube [facility])    Rx / DC Orders ED Discharge Orders    None       Hayden Rasmussen, MD 12/06/20 2215

## 2020-12-06 NOTE — Discharge Instructions (Addendum)
You are seen in the emergency department for continued drainage from your PEG tube stoma site.  This area does not look infected.  The PEG tube is functioning appropriately.  Please have the nursing staff continue with dressing changes as directed.  Wound care.  Wound care to area around PEG tube.  Cleanse with tepid tap water, pat gently dry.  Apply 1/8 inch thick layer of triple paste to cover area of PERI tube irritant contact dermatitis.  Top with silver Hydrofiber(Aquacel AG plus advantage,) fashion like a split gauze.  Top with drawtex.  Change twice daily and as needed leakage of effluent from tube site  Please also consider reconnecting with hospice

## 2020-12-06 NOTE — ED Notes (Signed)
Called pharmacy for med, they are sending asap.

## 2020-12-06 NOTE — ED Notes (Signed)
Facility nurse confirms the pt's BP is consistent with the regular pressures assessed at the facility.

## 2020-12-06 NOTE — ED Triage Notes (Signed)
Pt arrived to ED via EMS from [facility] w/ staff reporting her peg tube is leaking around the incision site and that pt has had increased abd pain x few days. VSS w/ EMS. Pt on 2L O2 at baseline and is cognitively at baseline.

## 2020-12-08 ENCOUNTER — Encounter: Payer: Self-pay | Admitting: Adult Health Nurse Practitioner

## 2020-12-08 ENCOUNTER — Other Ambulatory Visit: Payer: Self-pay | Admitting: *Deleted

## 2020-12-08 ENCOUNTER — Non-Acute Institutional Stay: Payer: Medicare Other | Admitting: Adult Health Nurse Practitioner

## 2020-12-08 DIAGNOSIS — I10 Essential (primary) hypertension: Secondary | ICD-10-CM | POA: Diagnosis not present

## 2020-12-08 DIAGNOSIS — E43 Unspecified severe protein-calorie malnutrition: Secondary | ICD-10-CM

## 2020-12-08 DIAGNOSIS — J9610 Chronic respiratory failure, unspecified whether with hypoxia or hypercapnia: Secondary | ICD-10-CM | POA: Diagnosis not present

## 2020-12-08 DIAGNOSIS — Z86711 Personal history of pulmonary embolism: Secondary | ICD-10-CM | POA: Diagnosis not present

## 2020-12-08 DIAGNOSIS — L89152 Pressure ulcer of sacral region, stage 2: Secondary | ICD-10-CM | POA: Diagnosis not present

## 2020-12-08 DIAGNOSIS — E782 Mixed hyperlipidemia: Secondary | ICD-10-CM | POA: Diagnosis not present

## 2020-12-08 DIAGNOSIS — Z931 Gastrostomy status: Secondary | ICD-10-CM | POA: Diagnosis not present

## 2020-12-08 DIAGNOSIS — R2681 Unsteadiness on feet: Secondary | ICD-10-CM | POA: Diagnosis not present

## 2020-12-08 DIAGNOSIS — Z515 Encounter for palliative care: Secondary | ICD-10-CM

## 2020-12-08 DIAGNOSIS — E46 Unspecified protein-calorie malnutrition: Secondary | ICD-10-CM | POA: Diagnosis not present

## 2020-12-08 DIAGNOSIS — M6281 Muscle weakness (generalized): Secondary | ICD-10-CM | POA: Diagnosis not present

## 2020-12-08 DIAGNOSIS — L03311 Cellulitis of abdominal wall: Secondary | ICD-10-CM | POA: Diagnosis not present

## 2020-12-08 DIAGNOSIS — R64 Cachexia: Secondary | ICD-10-CM | POA: Diagnosis not present

## 2020-12-08 DIAGNOSIS — J8489 Other specified interstitial pulmonary diseases: Secondary | ICD-10-CM | POA: Diagnosis not present

## 2020-12-08 DIAGNOSIS — D649 Anemia, unspecified: Secondary | ICD-10-CM | POA: Diagnosis not present

## 2020-12-08 NOTE — Patient Outreach (Signed)
Member screened for potential care coordination needs.   [name] resides in [facility]. Update received from SNF SW indicating member's family is not sure of transition plan at this time. [name] requires more care now than before.   Will continue to follow.   [name] Rolling, MSN, RN,BSN [facility] Acute Care Coordinator [phone] [facility] [phone]  (Toll free office)

## 2020-12-08 NOTE — Progress Notes (Signed)
[facility] Consult Note Telephone: [phone]  Fax: [phone]    Date of encounter: 12/08/20 PATIENT NAME: [name] [name] [address] [place] [address]   [phone] (home)  DOB: Nov 19, 1930 MRN: [id number] PRIMARY CARE PROVIDER:    Dr. Maryella Shivers  REFERRING PROVIDER:   Roddie Mc PA  RESPONSIBLE PARTY:    Contact Information    Name Relation Home Work [facility] [phone]  [phone]   [place], [place] [phone]  [place] [phone]  [phone]       I met face to face with patient and family in facility. Palliative Care was asked to follow this patient by consultation request of  Roddie Mc PA to address advance care planning and complex medical decision making. This is a follow up visit.  Sister, nephew and family friend present during visit today                                   ASSESSMENT AND PLAN / RECOMMENDATIONS:   Advance Care Planning/Goals of Care: Goals include to maximize quality of life and symptom management.   CODE STATUS: full code  Symptom Management/Plan:  PCM: Patient is maintaining weight around 87 to 88 pounds.  She is able to take in oral intake throughout the day but does need assistance with feeding.  Patient is working with PT/OT for her weakness.  Continue PT/OT as ordered.  Continue recommendations by dietitian for supplemental feeding.  Wounds: Wounds are being managed by wound nurse and doctor at facility.  Continue wound care as ordered  Had extensive conversation with family about expectations.  Discussed hospice versus continuing treatments.  Family has hope that she is going to get better and wants to continue PT/OT.  They would like the feeding tube to remain in place and are working with the scheduler to get an appointment with the surgeon who put it in.  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  Family is open to palliative services continuing to monitor their loved one and making recommendations based on any progress and/or decline.    Follow up Palliative Care Visit: Palliative care will continue to follow for complex medical decision making, advance care planning, and clarification of goals. Return 2-4 weeks or prn. Family encouraged to call with any questions or concerns  I spent 80 minutes providing this consultation. More than 50% of the time in this consultation was spent in counseling and care coordination.  PPS: 40-30%  HOSPICE ELIGIBILITY/DIAGNOSIS: TBD  Chief Complaint: follow up palliative visit  HISTORY OF PRESENT ILLNESS:  [name] [name] is a 87 y.o. year old female  with PCM, CHF, pulmonary fibrosis, RA, CAD, iron deficiency anemia, HLD, DJD.  Patient is more alert today than she was at prior visit.  She does voice having pain and is preoccupied with discomfort to sacral wound.  Assisted patient with call bell to get assistance.  Patient has had 3 ED visits over the past week for PEG tube malfunction.  Patient's baseline albumin is 2.4.  Patient is ambulatory with assistance.  Family does state that sometimes she is not able to stand or walk on her own but other times she is able to.  Family does state that she at times has weakness and cannot lift eating utensils and does require  help with feeding and drinking as she cannot lift a glass.  Patient gets supplemental nutrition at night through PEG tube and is allowed oral intake throughout the day.  She does get her medications through PEG tube.  Patient's lowest weight prior to PEG tube placement was about 73 pounds and is currently maintaining weight around 87 to 88 pounds.  History obtained from review of EMR, discussion with primary team, and interview with family, facility staff and Ms. Bucher.  I reviewed available labs, medications, imaging, studies and related documents from  the EMR.  Records reviewed and summarized above.   Physical Exam:  Constitutional: NAD General: frail appearing, thin  EYES: anicteric sclera, lids intact, no discharge  ENMT: intact hearing, oral mucous membranes moist CV: S1S2, RRR, no LE edema Pulmonary: LCTA, no increased work of breathing, no cough Abdomen: hypoactive BS + 4 quadrants, PEG tube in place MSK: moves all extremities, ambulatory with walker Skin: warm and dry, dehisced wound and sacral pressure injury (being followed by wound nurse at facility; did not remove bandages for exam today) Neuro:  Patient A&O x3  Thank you for the opportunity to participate in the care of Ms. Gross.  The palliative care team will continue to follow. Please call our office at [phone] if we can be of additional assistance.    [name], NP , DNP  This chart was dictated using voice recognition software. Despite best efforts to proofread, errors can occur which can change the documentation meaning.   COVID-19 PATIENT SCREENING TOOL Asked and negative response unless otherwise noted:   Have you had symptoms of covid, tested positive or been in contact with someone with symptoms/positive test in the past 5-10 days? negative

## 2020-12-09 ENCOUNTER — Other Ambulatory Visit: Payer: Self-pay

## 2020-12-09 ENCOUNTER — Telehealth: Payer: Self-pay | Admitting: Adult Health Nurse Practitioner

## 2020-12-09 NOTE — Telephone Encounter (Signed)
Spoke with business office at facility to update that patient wants to continue with skilled days instead of pursuing hospice [name] [name] K. Olena Heckle NP

## 2020-12-10 DIAGNOSIS — R296 Repeated falls: Secondary | ICD-10-CM | POA: Diagnosis not present

## 2020-12-10 DIAGNOSIS — G894 Chronic pain syndrome: Secondary | ICD-10-CM | POA: Diagnosis not present

## 2020-12-11 DIAGNOSIS — R4182 Altered mental status, unspecified: Secondary | ICD-10-CM | POA: Diagnosis not present

## 2020-12-11 DIAGNOSIS — R296 Repeated falls: Secondary | ICD-10-CM | POA: Diagnosis not present

## 2020-12-14 DIAGNOSIS — S31105D Unspecified open wound of abdominal wall, periumbilic region without penetration into peritoneal cavity, subsequent encounter: Secondary | ICD-10-CM | POA: Diagnosis not present

## 2020-12-14 DIAGNOSIS — I952 Hypotension due to drugs: Secondary | ICD-10-CM | POA: Diagnosis not present

## 2020-12-14 DIAGNOSIS — E640 Sequelae of protein-calorie malnutrition: Secondary | ICD-10-CM | POA: Diagnosis not present

## 2020-12-14 DIAGNOSIS — R1312 Dysphagia, oropharyngeal phase: Secondary | ICD-10-CM | POA: Diagnosis not present

## 2020-12-14 DIAGNOSIS — M6281 Muscle weakness (generalized): Secondary | ICD-10-CM | POA: Diagnosis not present

## 2020-12-14 DIAGNOSIS — M0689 Other specified rheumatoid arthritis, multiple sites: Secondary | ICD-10-CM | POA: Diagnosis not present

## 2020-12-15 DIAGNOSIS — E782 Mixed hyperlipidemia: Secondary | ICD-10-CM | POA: Diagnosis not present

## 2020-12-15 DIAGNOSIS — L89152 Pressure ulcer of sacral region, stage 2: Secondary | ICD-10-CM | POA: Diagnosis not present

## 2020-12-15 DIAGNOSIS — J8489 Other specified interstitial pulmonary diseases: Secondary | ICD-10-CM | POA: Diagnosis not present

## 2020-12-15 DIAGNOSIS — J9610 Chronic respiratory failure, unspecified whether with hypoxia or hypercapnia: Secondary | ICD-10-CM | POA: Diagnosis not present

## 2020-12-15 DIAGNOSIS — R64 Cachexia: Secondary | ICD-10-CM | POA: Diagnosis not present

## 2020-12-15 DIAGNOSIS — M6281 Muscle weakness (generalized): Secondary | ICD-10-CM | POA: Diagnosis not present

## 2020-12-15 DIAGNOSIS — I10 Essential (primary) hypertension: Secondary | ICD-10-CM | POA: Diagnosis not present

## 2020-12-15 DIAGNOSIS — D649 Anemia, unspecified: Secondary | ICD-10-CM | POA: Diagnosis not present

## 2020-12-15 DIAGNOSIS — L03311 Cellulitis of abdominal wall: Secondary | ICD-10-CM | POA: Diagnosis not present

## 2020-12-15 DIAGNOSIS — Z86711 Personal history of pulmonary embolism: Secondary | ICD-10-CM | POA: Diagnosis not present

## 2020-12-15 DIAGNOSIS — R2681 Unsteadiness on feet: Secondary | ICD-10-CM | POA: Diagnosis not present

## 2020-12-15 DIAGNOSIS — Z931 Gastrostomy status: Secondary | ICD-10-CM | POA: Diagnosis not present

## 2020-12-17 ENCOUNTER — Other Ambulatory Visit: Payer: Self-pay | Admitting: *Deleted

## 2020-12-17 DIAGNOSIS — M6281 Muscle weakness (generalized): Secondary | ICD-10-CM | POA: Diagnosis not present

## 2020-12-17 DIAGNOSIS — Z931 Gastrostomy status: Secondary | ICD-10-CM | POA: Diagnosis not present

## 2020-12-17 DIAGNOSIS — J8489 Other specified interstitial pulmonary diseases: Secondary | ICD-10-CM | POA: Diagnosis not present

## 2020-12-17 DIAGNOSIS — D649 Anemia, unspecified: Secondary | ICD-10-CM | POA: Diagnosis not present

## 2020-12-17 DIAGNOSIS — I10 Essential (primary) hypertension: Secondary | ICD-10-CM | POA: Diagnosis not present

## 2020-12-17 DIAGNOSIS — L03311 Cellulitis of abdominal wall: Secondary | ICD-10-CM | POA: Diagnosis not present

## 2020-12-17 DIAGNOSIS — Z86711 Personal history of pulmonary embolism: Secondary | ICD-10-CM | POA: Diagnosis not present

## 2020-12-17 DIAGNOSIS — R64 Cachexia: Secondary | ICD-10-CM | POA: Diagnosis not present

## 2020-12-17 DIAGNOSIS — J9610 Chronic respiratory failure, unspecified whether with hypoxia or hypercapnia: Secondary | ICD-10-CM | POA: Diagnosis not present

## 2020-12-17 DIAGNOSIS — L89152 Pressure ulcer of sacral region, stage 2: Secondary | ICD-10-CM | POA: Diagnosis not present

## 2020-12-17 DIAGNOSIS — R2681 Unsteadiness on feet: Secondary | ICD-10-CM | POA: Diagnosis not present

## 2020-12-17 DIAGNOSIS — E782 Mixed hyperlipidemia: Secondary | ICD-10-CM | POA: Diagnosis not present

## 2020-12-17 NOTE — Patient Outreach (Signed)
[place] Coordinator follow up. Member screened for potential care coordination needs.   Mrs. Gibler resides in [facility]. Communication sent to facility SNF SW to inquire about transition plans.   Mrs. Shadoan's PCP has [facility] embedded care management team. Will continue to follow while member resides in SNF.    Marthenia Rolling, MSN, RN,BSN [place] Acute Care Coordinator [phone] [facility]) [phone]  (Toll free office)

## 2020-12-18 DIAGNOSIS — E640 Sequelae of protein-calorie malnutrition: Secondary | ICD-10-CM | POA: Diagnosis not present

## 2020-12-18 DIAGNOSIS — I209 Angina pectoris, unspecified: Secondary | ICD-10-CM | POA: Diagnosis not present

## 2020-12-18 DIAGNOSIS — R627 Adult failure to thrive: Secondary | ICD-10-CM | POA: Diagnosis not present

## 2020-12-18 DIAGNOSIS — E782 Mixed hyperlipidemia: Secondary | ICD-10-CM | POA: Diagnosis not present

## 2020-12-18 DIAGNOSIS — K5901 Slow transit constipation: Secondary | ICD-10-CM | POA: Diagnosis not present

## 2020-12-18 DIAGNOSIS — G894 Chronic pain syndrome: Secondary | ICD-10-CM | POA: Diagnosis not present

## 2020-12-18 DIAGNOSIS — M6281 Muscle weakness (generalized): Secondary | ICD-10-CM | POA: Diagnosis not present

## 2020-12-18 DIAGNOSIS — R1312 Dysphagia, oropharyngeal phase: Secondary | ICD-10-CM | POA: Diagnosis not present

## 2020-12-18 DIAGNOSIS — K219 Gastro-esophageal reflux disease without esophagitis: Secondary | ICD-10-CM | POA: Diagnosis not present

## 2020-12-18 DIAGNOSIS — R296 Repeated falls: Secondary | ICD-10-CM | POA: Diagnosis not present

## 2020-12-18 DIAGNOSIS — S31105D Unspecified open wound of abdominal wall, periumbilic region without penetration into peritoneal cavity, subsequent encounter: Secondary | ICD-10-CM | POA: Diagnosis not present

## 2020-12-18 DIAGNOSIS — I9589 Other hypotension: Secondary | ICD-10-CM | POA: Diagnosis not present

## 2020-12-22 ENCOUNTER — Other Ambulatory Visit: Payer: Self-pay

## 2020-12-22 ENCOUNTER — Ambulatory Visit (INDEPENDENT_AMBULATORY_CARE_PROVIDER_SITE_OTHER): Payer: Medicare Other | Admitting: Pulmonary Disease

## 2020-12-22 ENCOUNTER — Telehealth: Payer: Self-pay | Admitting: Adult Health Nurse Practitioner

## 2020-12-22 ENCOUNTER — Encounter: Payer: Self-pay | Admitting: Pulmonary Disease

## 2020-12-22 VITALS — BP 112/62 | HR 111 | Temp 97.2°F | Ht 64.0 in | Wt 90.7 lb

## 2020-12-22 DIAGNOSIS — L89152 Pressure ulcer of sacral region, stage 2: Secondary | ICD-10-CM | POA: Diagnosis not present

## 2020-12-22 DIAGNOSIS — R2681 Unsteadiness on feet: Secondary | ICD-10-CM | POA: Diagnosis not present

## 2020-12-22 DIAGNOSIS — I10 Essential (primary) hypertension: Secondary | ICD-10-CM | POA: Diagnosis not present

## 2020-12-22 DIAGNOSIS — E782 Mixed hyperlipidemia: Secondary | ICD-10-CM | POA: Diagnosis not present

## 2020-12-22 DIAGNOSIS — J849 Interstitial pulmonary disease, unspecified: Secondary | ICD-10-CM

## 2020-12-22 DIAGNOSIS — R64 Cachexia: Secondary | ICD-10-CM | POA: Diagnosis not present

## 2020-12-22 DIAGNOSIS — D649 Anemia, unspecified: Secondary | ICD-10-CM | POA: Diagnosis not present

## 2020-12-22 DIAGNOSIS — Z931 Gastrostomy status: Secondary | ICD-10-CM | POA: Diagnosis not present

## 2020-12-22 DIAGNOSIS — L03311 Cellulitis of abdominal wall: Secondary | ICD-10-CM | POA: Diagnosis not present

## 2020-12-22 DIAGNOSIS — M6281 Muscle weakness (generalized): Secondary | ICD-10-CM | POA: Diagnosis not present

## 2020-12-22 DIAGNOSIS — J9610 Chronic respiratory failure, unspecified whether with hypoxia or hypercapnia: Secondary | ICD-10-CM | POA: Diagnosis not present

## 2020-12-22 DIAGNOSIS — J8489 Other specified interstitial pulmonary diseases: Secondary | ICD-10-CM | POA: Diagnosis not present

## 2020-12-22 DIAGNOSIS — Z86711 Personal history of pulmonary embolism: Secondary | ICD-10-CM | POA: Diagnosis not present

## 2020-12-22 NOTE — Progress Notes (Addendum)
KINDSEY EBLIN    [id number]    1931-03-17  Primary Care Physician:Crawford, Real Cons, MD  Referring Physician: Hoyt Koch, MD [address] [place],  [place] [address]  Chief complaint: Follow-up for RA ILD  HPI: 87 year old with RA ILD, UIP fibrosis, chronic hypoxic respiratory failure on oxygen, DVT, PE Former patient of Dr. Lake Bells.  Pulmonary fibrosis diagnosed in 2003.    She was previously evaluated at [facility] and [facility].  Being managed by Dr. Trudie Reed for rheumatoid arthritis She was previously on infliximab, leflunomide and Imuran, which she did not tolerate due to severe GI symptoms.  Changed to CellCept in later part of 2019 Also has history of recurrent PE, DVT.  She has been difficult antiplatelet due to frequent falls.  Anticoagulation was stopped in January 2018 and she gets Lovenox to use when she does long distance travel. Started Ofev around March 2021 Hospitalized in July 2021 for dizziness, fall.  It is unclear to secondary to the Ofev however the medication was held  Received note from Dr. Trudie Reed, rheumatology dated 04/18/2020 Patient continues on Remicade, mycophenolate and low-dose prednisone with good control of symptoms  Smoking history: Former smoker.  Quit in 1975.  10-pack-year smoking history  Interim history: She had small bowel obstruction in February 2022 requiring exploratory laparotomy With repeat hospitalizations in March and April 2022 for wound infections.  She had a prolonged stay at rehab and then sent to skilled nursing facility  Currently has a feeding tube. She is very frail, has lost a lot of weight and appears malnourished.  She has balance issues and is working with PT Continues on supplemental oxygen.  She is due to follow-up with surgery to see if the feeding tube can come out  She is also following up with palliative care as an outpatient  Outpatient Encounter Medications as of 12/22/2020   Medication Sig   acetaminophen (TYLENOL) 325 MG tablet Take 2 tablets (650 mg total) by mouth every 6 (six) hours as needed for mild pain, fever or headache (or Fever >/= 101). (Patient taking differently: Place 650 mg into feeding tube every 6 (six) hours as needed for mild pain, fever or headache (or Fever >/= 101).)   benzocaine (ORAJEL) 10 % mucosal gel Use as directed in the mouth or throat as needed for mouth pain. (Patient taking differently: Use as directed 1 application in the mouth or throat 4 (four) times daily as needed for mouth pain.)   chlorhexidine (PERIDEX) 0.12 % solution Use as directed 15 mLs in the mouth or throat 4 (four) times daily.   dronabinol (MARINOL) 2.5 MG capsule Take 1 capsule (2.5 mg total) by mouth 2 (two) times daily before lunch and supper.   famotidine (PEPCID) 20 MG tablet Place 20 mg into feeding tube daily.   famotidine (PEPCID) 40 MG/5ML suspension Take 2.5 mLs (20 mg total) by mouth daily.   fluticasone (FLONASE) 50 MCG/ACT nasal spray Place 2 sprays into both nostrils daily as needed for allergies or rhinitis.   gabapentin (NEURONTIN) 300 MG capsule Take 1 capsule (300 mg total) by mouth 3 (three) times daily. (Patient taking differently: Place 300 mg into feeding tube 3 (three) times daily.)   lactose free nutrition (BOOST) LIQD Place 1 Container into feeding tube in the morning and at bedtime.   Lidocaine 4 % PTCH Apply 1 patch topically See admin instructions. Apply to right forearm leave on for 12  hours then remove   lipase/protease/amylase (CREON) 36000 UNITS CPEP capsule Take 1 capsule (36,000 Units total) by mouth 3 (three) times daily before meals. (Patient taking differently: 36,000 Units 3 (three) times daily before meals. Via tube)   midodrine (PROAMATINE) 5 MG tablet Take 1 tablet (5 mg total) by mouth 3 (three) times daily with meals. (Patient taking differently: Place 5 mg into feeding tube 3 (three) times daily with meals.)   mirtazapine  (REMERON SOL-TAB) 30 MG disintegrating tablet Take 1 tablet (30 mg total) by mouth at bedtime.   mirtazapine (REMERON) 30 MG tablet Place 15 mg into feeding tube at bedtime.   morphine (MS CONTIN) 15 MG 12 hr tablet Take 1 tablet (15 mg total) by mouth every 12 (twelve) hours. (Patient taking differently: 15 mg every 12 (twelve) hours. Via tube)   Multiple Vitamin (MULTIVITAMIN WITH MINERALS) TABS tablet Take 1 tablet by mouth daily. (Patient taking differently: Place 1 tablet into feeding tube daily.)   mycophenolate (CELLCEPT) 500 MG tablet 2 tabs bid (Patient taking differently: Place 1,000 mg into feeding tube 2 (two) times daily.)   Nintedanib (OFEV) 100 MG CAPS Take 1 capsule (100 mg total) by mouth 2 (two) times daily. (Patient taking differently: Place 100 mg into feeding tube 2 (two) times daily.)   nitroGLYCERIN (NITROSTAT) 0.3 MG SL tablet Place 0.3 mg under the tongue every 5 (five) minutes as needed for chest pain.   Nutritional Supplements (ENSURE ORIGINAL PO) 237 mLs by PEG Tube route daily.   Nutritional Supplements (FEEDING SUPPLEMENT, OSMOLITE 1.2 CAL,) LIQD Place 50 mL/hr into feeding tube continuous.   nystatin (MYCOSTATIN/NYSTOP) powder Apply 1 application topically See admin instructions. Apply qd around peg of circle   oxyCODONE (OXY IR/ROXICODONE) 5 MG immediate release tablet Take 1 tablet (5 mg total) by mouth every 4 (four) hours as needed for severe pain. (Patient taking differently: Place 5 mg into feeding tube every 4 (four) hours as needed for severe pain.)   polyethylene glycol (MIRALAX / GLYCOLAX) 17 g packet Take 17 g by mouth daily. (Patient taking differently: Place 17 g into feeding tube daily.)   predniSONE (DELTASONE) 5 MG tablet TAKE 1 TABLET DAILY WITH BREAKFAST (Patient taking differently: Place 5 mg into feeding tube daily with breakfast.)   senna (SENOKOT) 8.6 MG TABS tablet Take 1 tablet (8.6 mg total) by mouth 2 (two) times daily.   Zinc Oxide (TRIPLE  PASTE) 12.8 % ointment Apply topically 2 (two) times daily.   nitroGLYCERIN (NITROSTAT) 0.4 MG SL tablet Place 1 tablet (0.4 mg total) under the tongue every 5 (five) minutes as needed for chest pain. (Patient not taking: Reported on 12/03/2020)   No facility-administered encounter medications on file as of 12/22/2020.   Physical Exam: Blood pressure 112/62, pulse (!) 111, temperature (!) 97.2 F (36.2 C), temperature source Temporal, height 5\' 4"  (1.626 m), weight 90 lb 11.2 oz (41.1 kg), SpO2 97 %. Gen:      No acute distress, frail, malnourished HEENT:  EOMI, sclera anicteric Neck:     No masses; no thyromegaly Lungs:    Mild bibasal crackles CV:         Regular rate and rhythm; no murmurs Abd:      + bowel sounds; soft, non-tender; no palpable masses, no distension Ext:    No edema; adequate peripheral perfusion Skin:      Warm and dry; no rash Neuro: alert and oriented x 3 Psych: normal mood and affect   Data  Reviewed: Imaging: High-res CT 10/26/2017-UIP pattern pulmonary fibrosis.  Stable compared to 2017.   High-res CT 05/30/2019-UIP pattern fibrosis.  Stable.  CTA 01/24/2020-no PE, stable changes of emphysema and UIP pulmonary fibrosis  CT abdomen pelvis 10/24/2020-visualized lung bases show a stable pattern of UIP pulmonary fibrosis I have reviewed the images personally.  PFTs: 10/13/2017 FVC 1.67 [86%], FEV1 1.36 [89%], F/F 81, TLC 3.64 [70%], DLCO 6.17 [24%] Mild restriction and severe diffusion defect  07/25/2019 FVC 1.63 [85%], FEV1 1.32 [89%], F/F 81, TLC 3.74 [71%), DLCO 5.83 [30%] Mild restriction, severe diffusion  6 min walk July 2017 6 minute walk distance 324 m, O2 saturation nadir 93% on room air 09/06/2019 -37 m, nadir O2 sat of 82%  Cardiac: Echocardiogram 01/26/2020-LVEF 55-60%, normal RV size and function.  Normal PA systolic pressure.  Assessment:  RA ILD with UIP fibrosis Continues on mycophenolate, prednisone and Ofev.  Dickey Gave was started in early  2021  Since her last visit here she has had a change in health with small bowel obstruction, exploratory laparotomy and multiple admissions for wound infection She has lost a lot of weight and appears frail, malnourished  Discussed risk benefit of continuing anti fibrotics and CellCept as I am not sure this is helping and may be making her condition worse with infections, loss of weight and appetite.  As noted before her UIP fibrosis has been stable on CT scan dating back to 2014 but with clear progression compared to 2009 so benefit may be limited  Discussed in detail with her nephew and patient today.  Given little benefit and due to side effect profile we will stop the CellCept and Ofev.  She will continue the prednisone for now and follow-up with Dr. Amil Amen about Remicade Of note she states that she does not have any rheumatoid arthritis symptoms at present and may be okay to observe off therapy  History of DVT, PE Not on chronic anticoagulation due to concern for fall  Health maintenance Has received booster vaccine   Plan/Recommendations: - Stop CellCept and Ofev - High-res CT in 3 months for monitoring  Marshell Garfinkel MD [facility] 12/22/2020, 12:02 PM  CC: Hoyt Koch, *

## 2020-12-22 NOTE — Telephone Encounter (Signed)
Returned sister's VM about dental appt and other appts.  Left VM with reason for call and that I had planned to see her sister tomorrow. Will call again after visit with patient tomorrow. Maryjane Benedict K. Olena Heckle NP

## 2020-12-22 NOTE — Patient Instructions (Signed)
We will take you off of Ofev and CellCept as I do not think they are helping at this point and may make your weight loss worse  Schedule high-res CT in 3 months Follow-up in clinic after CT

## 2020-12-23 ENCOUNTER — Non-Acute Institutional Stay: Payer: Medicare Other | Admitting: Adult Health Nurse Practitioner

## 2020-12-23 ENCOUNTER — Encounter: Payer: Self-pay | Admitting: Adult Health Nurse Practitioner

## 2020-12-23 VITALS — HR 93 | Wt 90.7 lb

## 2020-12-23 DIAGNOSIS — T8131XD Disruption of external operation (surgical) wound, not elsewhere classified, subsequent encounter: Secondary | ICD-10-CM | POA: Diagnosis not present

## 2020-12-23 DIAGNOSIS — Z515 Encounter for palliative care: Secondary | ICD-10-CM | POA: Diagnosis not present

## 2020-12-23 DIAGNOSIS — L89152 Pressure ulcer of sacral region, stage 2: Secondary | ICD-10-CM

## 2020-12-23 DIAGNOSIS — E46 Unspecified protein-calorie malnutrition: Secondary | ICD-10-CM | POA: Diagnosis not present

## 2020-12-23 DIAGNOSIS — R4189 Other symptoms and signs involving cognitive functions and awareness: Secondary | ICD-10-CM

## 2020-12-23 DIAGNOSIS — L7682 Other postprocedural complications of skin and subcutaneous tissue: Secondary | ICD-10-CM | POA: Diagnosis not present

## 2020-12-23 DIAGNOSIS — E43 Unspecified severe protein-calorie malnutrition: Secondary | ICD-10-CM | POA: Diagnosis not present

## 2020-12-23 NOTE — Progress Notes (Signed)
[facility] Consult Note Telephone: [phone]  Fax: [phone]    Date of encounter: 12/23/20 PATIENT NAME: [name] [name] [address] [place] [address]   [phone] (home)  DOB: [birth year] MRN: [id number] PRIMARY CARE PROVIDER:    Dr. Maryella Shivers  REFERRING PROVIDER:   Roddie Mc PA  RESPONSIBLE PARTY:    Contact Information     Name Relation Home Work [facility] [phone]  [phone]   [place], [place] [phone]  [phone]   [name] [phone]  [phone]        I met face to face with patient in facility. Palliative Care was asked to follow this patient by consultation request of  Roddie Mc PA to address advance care planning and complex medical decision making. This is a follow up visit.  Spoke with sister, [name] [name], to update on visit today                                   ASSESSMENT AND PLAN / RECOMMENDATIONS:   Advance Care Planning/Goals of Care: Goals include to maximize quality of life and symptom management. Did not discuss ACP today as patient seemed confused CODE STATUS: full code  Symptom Management/Plan:  Sacral wound:  per patient this is improving and she is having less pain.  Did not see wound nurse to speak with her today.  Continue wound care at facility  [facility]:  patient continues to get supplementation and meds through PEG tube.  Is able to get some oral intake.  Has had slow increase in weight. Has appointment with surgeon who placed the PEG tube next week for further consultation about keeping versus taking out the tube.  Cognitive impairment:  Patient is starting to have signs of possible early dementia.  She has some confusion today and states that her PEG tube has been removed when it hasn't.    Discussed with sister today that patient requires extensive assistive with walking and ADLs and this is a barrier to her  going home.  Discussed possible transition to long term care once skilled days are over.   Follow up Palliative Care Visit: Palliative care will continue to follow for complex medical decision making, advance care planning, and clarification of goals. Return 2 weeks or prn. Sister encouraged to call with any questions or concerns.  I spent 45 minutes providing this consultation. More than 50% of the time in this consultation was spent in counseling and care coordination.   PPS: 40-30%  HOSPICE ELIGIBILITY/DIAGNOSIS: TBD  Chief Complaint: follow up palliative visit  HISTORY OF PRESENT ILLNESS:  [name] [name] is a 87 y.o. year old female  with PCM, CHF, pulmonary fibrosis, RA, CAD, iron deficiency anemia, HLD, DJD .  Patient has no new concerns today.  States that she feels her sacral wound is improving as it it is not as painful as it used to be.  Patient is having confusion today as she states that her PEG tube has been removed about 2 days ago and that she is taking all nutrition and meds orally.  She still has PEG tube in place.  HPI/ROS unreliable secondary to cognitive impairment.  Patient still requires assistance getting up and to walk.  Requires assistance with ADLs except feeding.  Patient states that she just really wants to go home today.  Patient is not able to go home  without 24/7 care.  Patient is still continuing to have slow increase in weight and weight has increased to 90.7 pounds with BMI 15.57.  Sacral wound being taken care of by wound nurse and doctor at facility.  History obtained from review of EMR, discussion with primary team, and interview with family, facility staff and [name] [name].    Physical Exam:   Constitutional: NAD General: frail appearing, thin EYES: anicteric sclera, lids intact, no discharge ENMT: intact hearing, oral mucous membranes moist CV: S1S2, RRR, no LE edema Pulmonary: LCTA, no increased work of breathing, no cough Abdomen: hypoactive BS + 4  quadrants, PEG tube in place MSK: moves all extremities, ambulatory with walker with assistance Skin: warm and dry,  sacral pressure injury (being followed by wound nurse at facility; did not remove bandages for exam today) Neuro:  Patient A&O to person and place today   Thank you for the opportunity to participate in the care of [name] [name].  The palliative care team will continue to follow. Please call our office at [phone] if we can be of additional assistance.   Larrie Fraizer [name], NP , DNP  This chart was dictated using voice recognition software.  Despite best efforts to proofread,  errors can occur which can change the documentation meaning.   COVID-19 PATIENT SCREENING TOOL Asked and negative response unless otherwise noted:   Have you had symptoms of covid, tested positive or been in contact with someone with symptoms/positive test in the past 5-10 days?

## 2020-12-29 DIAGNOSIS — D649 Anemia, unspecified: Secondary | ICD-10-CM | POA: Diagnosis not present

## 2020-12-29 DIAGNOSIS — L89152 Pressure ulcer of sacral region, stage 2: Secondary | ICD-10-CM | POA: Diagnosis not present

## 2020-12-29 DIAGNOSIS — I10 Essential (primary) hypertension: Secondary | ICD-10-CM | POA: Diagnosis not present

## 2020-12-29 DIAGNOSIS — Z86711 Personal history of pulmonary embolism: Secondary | ICD-10-CM | POA: Diagnosis not present

## 2020-12-29 DIAGNOSIS — Z931 Gastrostomy status: Secondary | ICD-10-CM | POA: Diagnosis not present

## 2020-12-29 DIAGNOSIS — J9610 Chronic respiratory failure, unspecified whether with hypoxia or hypercapnia: Secondary | ICD-10-CM | POA: Diagnosis not present

## 2020-12-29 DIAGNOSIS — J8489 Other specified interstitial pulmonary diseases: Secondary | ICD-10-CM | POA: Diagnosis not present

## 2020-12-29 DIAGNOSIS — L03311 Cellulitis of abdominal wall: Secondary | ICD-10-CM | POA: Diagnosis not present

## 2020-12-29 DIAGNOSIS — E782 Mixed hyperlipidemia: Secondary | ICD-10-CM | POA: Diagnosis not present

## 2020-12-29 DIAGNOSIS — M6281 Muscle weakness (generalized): Secondary | ICD-10-CM | POA: Diagnosis not present

## 2020-12-29 DIAGNOSIS — R2681 Unsteadiness on feet: Secondary | ICD-10-CM | POA: Diagnosis not present

## 2020-12-29 DIAGNOSIS — R64 Cachexia: Secondary | ICD-10-CM | POA: Diagnosis not present

## 2021-01-05 ENCOUNTER — Telehealth: Payer: Self-pay | Admitting: Adult Health Nurse Practitioner

## 2021-01-05 DIAGNOSIS — L89152 Pressure ulcer of sacral region, stage 2: Secondary | ICD-10-CM | POA: Diagnosis not present

## 2021-01-05 DIAGNOSIS — J9610 Chronic respiratory failure, unspecified whether with hypoxia or hypercapnia: Secondary | ICD-10-CM | POA: Diagnosis not present

## 2021-01-05 DIAGNOSIS — M6281 Muscle weakness (generalized): Secondary | ICD-10-CM | POA: Diagnosis not present

## 2021-01-05 DIAGNOSIS — R64 Cachexia: Secondary | ICD-10-CM | POA: Diagnosis not present

## 2021-01-05 DIAGNOSIS — Z86711 Personal history of pulmonary embolism: Secondary | ICD-10-CM | POA: Diagnosis not present

## 2021-01-05 DIAGNOSIS — Z931 Gastrostomy status: Secondary | ICD-10-CM | POA: Diagnosis not present

## 2021-01-05 DIAGNOSIS — L03311 Cellulitis of abdominal wall: Secondary | ICD-10-CM | POA: Diagnosis not present

## 2021-01-05 DIAGNOSIS — R2681 Unsteadiness on feet: Secondary | ICD-10-CM | POA: Diagnosis not present

## 2021-01-05 DIAGNOSIS — I10 Essential (primary) hypertension: Secondary | ICD-10-CM | POA: Diagnosis not present

## 2021-01-05 DIAGNOSIS — E782 Mixed hyperlipidemia: Secondary | ICD-10-CM | POA: Diagnosis not present

## 2021-01-05 DIAGNOSIS — D649 Anemia, unspecified: Secondary | ICD-10-CM | POA: Diagnosis not present

## 2021-01-05 DIAGNOSIS — J8489 Other specified interstitial pulmonary diseases: Secondary | ICD-10-CM | POA: Diagnosis not present

## 2021-01-05 NOTE — Telephone Encounter (Signed)
Returned sisters for this message needing to reschedule visit for tomorrow.  Left voice message with reason for call and callback info Cayla Wiegand K. Olena Heckle, NP

## 2021-01-06 ENCOUNTER — Encounter: Payer: Self-pay | Admitting: Adult Health Nurse Practitioner

## 2021-01-06 ENCOUNTER — Non-Acute Institutional Stay: Payer: Medicare Other | Admitting: Adult Health Nurse Practitioner

## 2021-01-06 ENCOUNTER — Other Ambulatory Visit: Payer: Self-pay

## 2021-01-06 VITALS — BP 110/50 | HR 102 | Wt 92.0 lb

## 2021-01-06 DIAGNOSIS — Z515 Encounter for palliative care: Secondary | ICD-10-CM

## 2021-01-06 DIAGNOSIS — L89152 Pressure ulcer of sacral region, stage 2: Secondary | ICD-10-CM | POA: Diagnosis not present

## 2021-01-06 DIAGNOSIS — E43 Unspecified severe protein-calorie malnutrition: Secondary | ICD-10-CM

## 2021-01-06 NOTE — Progress Notes (Signed)
[facility] Consult Note Telephone: [phone]  Fax: [phone]    Date of encounter: 01/06/21 PATIENT NAME: [name] [name] [address] [place] [address]   [phone] (home)  DOB: [birth year] MRN: [id number] PRIMARY CARE PROVIDER:    Dr. [name] PA   REFERRING PROVIDER:   Roddie Mc PA  RESPONSIBLE PARTY:    Contact Information     Name Relation Home Work [facility] [phone]  [phone]   [place], [place] [phone]  [phone]   [name] [phone]  [phone]        I met face to face with patient and family in facility. Palliative Care was asked to follow this patient by consultation request of  Roddie Mc PA to address advance care planning and complex medical decision making. This is a follow up visit.  Spoke with sister, [name] [name], via phone to update on today's visit                                   ASSESSMENT AND PLAN / RECOMMENDATIONS:   Advance Care Planning/Goals of Care: Goals include to maximize quality of life and symptom management.  CODE STATUS: full code  Symptom Management/Plan:  PCM: Patient's weight continues to slowly increase.  Per patient and staff she is taking in more oral nutrition as well.  Encouraged to drink more fluids p.o. to stay hydrated as well.  Pressure injury to sacrum: Did speak with sister who states that at appointment with the surgeon who put in the feeding tube that her sacral wound is improving and feels as if the staff at facility is doing a good job taking care of it.  Continue wound care at facility  Discussed with sister continued options of staying at SNF or having patient return home with 24/7 care.  Sister does state that she has at least 2 more weeks of PT.  Discussed with sister that her sister will continue to need skilled nursing care for her feeding tube and for wound care.  Discussed  difficulties and barriers to having 24/7 care in the home with staffing shortages in the community.  Family does feel as if she would be better cared for at the SNF.  This will be ongoing discussion as family navigates their options.   Follow up Palliative Care Visit: Palliative care will continue to follow for complex medical decision making, advance care planning, and clarification of goals. Return 6-8 weeks or prn.  I spent 35 minutes providing this consultation. More than 50% of the time in this consultation was spent in counseling and care coordination.   PPS: 40-30%  HOSPICE ELIGIBILITY/DIAGNOSIS: TBD  Chief Complaint: follow up palliative visit  HISTORY OF PRESENT ILLNESS:  [name] [name] is a 87 y.o. year old female  with PCM, CHF, pulmonary fibrosis, RA, CAD, iron deficiency anemia, HLD, DJD .  Patient has no new concerns today.  She does state that she feels good today and states that she had been feeling a little slow over the past couple of days prior.  States that she does get pain especially in her sacral area where her wound is but gets good relief with current pain regimen.  She states that she has been working with physical therapy and is able to walk with walker with their assistance.  Staff does report that she has been working well with  physical therapy and that she is eating more orally.  She continues to have a feeding 6P368 at 90 mL/h and gets 125 mL flushes every 4 hours.  Have encouraged patient to increase her oral intake of water to stay hydrated.  Patient continues to get wound care at facility for sacral wound.  Staff does report that this seems about the same.  Patient's weight has slightly increased to 92 pounds up from 90.7 pounds 2 weeks ago.  Rest of 10 point ROS asked and negative except what is stated in HPI  History obtained from review of EMR and interview with family, facility staff and [name] [name].   Physical Exam:   Constitutional: NAD General: frail  appearing, thin EYES: anicteric sclera, lids intact, no discharge ENMT: intact hearing, oral mucous membranes moist CV: S1S2, RRR, no LE edema Pulmonary: LCTA, no increased work of breathing, no cough Abdomen: hypoactive BS + 4 quadrants, PEG tube in place MSK: moves all extremities, ambulatory with walker with assistance Skin: warm and dry,  sacral pressure injury (being followed by wound nurse at facility; did not remove bandages for exam today) Neuro:  Patient A&O to person and place today   Thank you for the opportunity to participate in the care of [name] [name].  The palliative care team will continue to follow. Please call our office at [phone] if we can be of additional assistance.   Larua Collier [name], NP , DNP  This chart was dictated using voice recognition software.  Despite best efforts to proofread,  errors can occur which can change the documentation meaning.   COVID-19 PATIENT SCREENING TOOL Asked and negative response unless otherwise noted:   Have you had symptoms of covid, tested positive or been in contact with someone with symptoms/positive test in the past 5-10 days?

## 2021-01-08 ENCOUNTER — Encounter: Payer: Self-pay | Admitting: Family

## 2021-01-08 ENCOUNTER — Other Ambulatory Visit: Payer: Self-pay

## 2021-01-08 ENCOUNTER — Ambulatory Visit (INDEPENDENT_AMBULATORY_CARE_PROVIDER_SITE_OTHER): Payer: Medicare Other | Admitting: Family

## 2021-01-08 VITALS — BP 100/58 | HR 106 | Ht 64.5 in | Wt 95.6 lb

## 2021-01-08 DIAGNOSIS — J8410 Pulmonary fibrosis, unspecified: Secondary | ICD-10-CM | POA: Diagnosis not present

## 2021-01-08 DIAGNOSIS — I25118 Atherosclerotic heart disease of native coronary artery with other forms of angina pectoris: Secondary | ICD-10-CM

## 2021-01-08 DIAGNOSIS — I5032 Chronic diastolic (congestive) heart failure: Secondary | ICD-10-CM

## 2021-01-08 DIAGNOSIS — Z86711 Personal history of pulmonary embolism: Secondary | ICD-10-CM

## 2021-01-08 DIAGNOSIS — E785 Hyperlipidemia, unspecified: Secondary | ICD-10-CM

## 2021-01-08 DIAGNOSIS — I959 Hypotension, unspecified: Secondary | ICD-10-CM

## 2021-01-08 DIAGNOSIS — Z86718 Personal history of other venous thrombosis and embolism: Secondary | ICD-10-CM | POA: Diagnosis not present

## 2021-01-08 MED ORDER — ATORVASTATIN CALCIUM 20 MG PO TABS: 20.0000 mg | ORAL_TABLET | Freq: Every day | ORAL | 5 refills | Status: AC

## 2021-01-08 NOTE — Patient Instructions (Addendum)
Medication Instructions:  Your physician has recommended you make the following change in your medication:   START Atorvastatin 20mg  daily  *If you need a refill on your cardiac medications before your next appointment, please call your pharmacy*   Lab Work: Your physician recommends lab work in 6 weeks for [facility] - we will recheck with [place]   Testing/Procedures: None ordered today   Follow-Up: At [facility], you and your health needs are our priority.  As part of our continuing mission to provide you with exceptional heart care, we have created designated Provider Care Teams.  These Care Teams include your primary Cardiologist (physician) and Advanced Practice Providers (APPs -  Physician Assistants and Nurse Practitioners) who all work together to provide you with the care you need, when you need it.  We recommend signing up for the patient portal called "MyChart".  Sign up information is provided on this After Visit Summary.  MyChart is used to connect with patients for Virtual Visits (Telemedicine).  Patients are able to view lab/test results, encounter notes, upcoming appointments, etc.  Non-urgent messages can be sent to your provider as well.   To learn more about what you can do with MyChart, go to [email or web address].    Your next appointment:   6 month(s)  The format for your next appointment:   In Person  Provider:   You may see Lauree Chandler, MD or one of the following Advanced Practice Providers on your designated Care Team:   Melina Copa, PA-C Ermalinda Barrios, PA-C  Other Instructions  Recommend increasing your fluid intake.   To prevent or reduce lower extremity swelling: Eat a low salt diet. Salt makes the body hold onto extra fluid which causes swelling. Sit with legs elevated. For example, in the recliner or on an [place].  Wear knee-high compression stockings during the daytime. Ones labeled 15-20 mmHg provide good compression.

## 2021-01-08 NOTE — Progress Notes (Signed)
Office Visit    Patient Name: [name] [name] Date of Encounter: 01/08/2021  PCP:  Hoyt Koch, MD   [facility]  Cardiologist:  Lauree Chandler, MD  Advanced Practice Provider:  No care team member to display Electrophysiologist:  None   Chief Complaint    [name] [name] is a 87 y.o. female with a hx of coronary artery disease  (catheterization 2014 showing 30% mid LAD, 30% mid circumflex, 20% mid RCA stenosis and stress test 2017, 2018, 2020 with no ischemia), chronic diastolic heart failure, DVT/PE, hyperlipidemia, pulmonary fibrosis presents today for follow-up of CAD  Past Medical History    Past Medical History:  Diagnosis Date   Allergic rhinitis    Allergy    CAD (coronary artery disease)    Mild CAD by cath 2008   CHF (congestive heart failure) (HCC)    DJD (degenerative joint disease)    rheumatoid   DVT (deep venous thrombosis) ([place])    Dyslipidemia    History of echocardiogram    Echo 12/2018: EF 60-65, mod asymmetric LVH, Gr 1 DD   History of nuclear stress test    Myoview 5/17:  EF 74%, normal perfusion, low risk // Myoview 12/2018:  EF 89, very mild ischemia in inferoapical wall; Low Risk   History of pulmonary embolism    Hyperlipidemia    Insomnia    Osteoporosis    Psoriasis    Pulmonary fibrosis ([place])    Rheumatoid arthritis ([place])    Past Surgical History:  Procedure Laterality Date   ABDOMINAL HYSTERECTOMY  1974   APPENDECTOMY  1959   CATARACT EXTRACTION     IR [place] TUBE CHANGE  11/05/2020   IR [place] GASTRO/COLONIC TUBE PERCUT W/FLUORO  10/29/2020   IR [place] GASTRO/COLONIC TUBE PERCUT W/FLUORO  11/17/2020   LAPAROTOMY N/A 08/23/2020   Procedure: EXPLORATORY LAPAROTOMY small bowel resection gastrostomy tube placement;  Surgeon: Dwan Bolt, MD;  Location: [facility];  Service: General;  Laterality: N/A;   LEFT HEART CATHETERIZATION WITH CORONARY ANGIOGRAM N/A 06/18/2013   Procedure: LEFT HEART CATHETERIZATION WITH  CORONARY ANGIOGRAM;  Surgeon: Peter M Martinique, MD;  Location: [facility] CATH LAB;  Service: Cardiovascular;  Laterality: N/A;   TONSILLECTOMY  1941, [place], 2007   Bilateral    Allergies  Allergies  Allergen Reactions   Crestor [Rosuvastatin] Other (See Comments)    Muscle pain   Lactose Intolerance (Gi) Other (See Comments)    Stomach upset   Penicillins Hives   Imuran [Azathioprine] Nausea And Vomiting    History of Present Illness    [name] [name] is a 87 y.o. female with a hx of coronary artery disease  (catheterization 2014 showing 30% mid LAD, 30% mid circumflex, 20% mid RCA stenosis and stress test 2017, 2018, 2020 with no ischemia), chronic diastolic heart failure, DVT/PE, hyperlipidemia, pulmonary fibrosis last seen 04/29/20.  She was seen in the ED 02/2020 due to chest pain with troponin unremarkable and EKG no acute change. At follow up 04/2020 noted chest tightness with dyspnea associated with drop in oxygenation. It was presumed related to pulmonary fibrosis and she declined NST. Her swelling was slightly worse than baseline due to eating high sodium foods and Lasix three times per week was continued.   She had small bowel obstruction and torsion of small bowel mesentary early February 2022. Repeat hospitalizations 09/2020 and 10/2020 due to wound infections. Prolonged stay at rehab with  subsequent SNF. She is following with palliative care as an outpatient.   When seen by pulmonology 12/22/20, her CellCept and Ofev were discontinued due to concern they may be contributory to poor healing and of little benefit. Plan for CT in 3 months for monitoring.   She presents today for follow up with her sister-in-law.  She is presently at [facility] and rehab for skilled nursing and physical therapy. Notes some tachycardia with sensation of heart racing.  She has been working on increasing her p.o. intake with the eventual goal to have her PEG tube removed.   She tells me she has not been drinking very much and I anticipate dehydration is contributory to her tachycardia. Has felt about the same since stopping her Cellept and Ofev. Tells me she will get pain between her shoulder blades that happens predominantly at rest with certain positioning.  We discussed that this is atypical for angina and likely musculoskeletal.  Reports no exertional chest pain.  She endorses that her dyspnea on exertion is stable at her baseline.  She reports trace lower extremity edema but does not elevate her lower extremities.  She is agreeable to do so.  EKGs/Labs/Other Studies Reviewed:   The following studies were reviewed today:  NST 2017 Study Highlights   Nuclear stress EF: 97%. The study is normal. No ischemia This is a low risk study.      Echo  01/26/2020: IMPRESSIONS     1. Left ventricular ejection fraction, by estimation, is 55 to 60%. The  left ventricle has normal function. The left ventricle has no regional  wall motion abnormalities. Left ventricular diastolic parameters are  indeterminate. Elevated left atrial  pressure.   2. Right ventricular systolic function is normal. The right ventricular  size is normal. There is normal pulmonary artery systolic pressure.   3. The mitral valve is normal in structure. No evidence of mitral valve  regurgitation.   4. The aortic valve is tricuspid. Aortic valve regurgitation is not  visualized. Mild aortic valve sclerosis is present, with no evidence of  aortic valve stenosis.   5. The inferior vena cava is normal in size with <50% respiratory  variability, suggesting right atrial pressure of 8 mmHg.   NST 12/2018 Nuclear stress EF: 89%. There was no ST segment deviation noted during stress. Defect 1: There is a small defect of mild severity present in the apical inferior location. Findings consistent with ischemia. This is a low risk study. The left ventricular ejection fraction is hyperdynamic  (>65%).   Abnormal, low risk stress nuclear study with very mild ischemia in the inferoapical wall.  Gated ejection fraction 89% with normal wall motion.       Cardiac cath 06/18/13: Left mainstem: Normal Left anterior descending (LAD): Mild calcification. 30% disease in the mid vessel at the take off of the first diagonal. Left circumflex (LCx): 30% disease in the mid vessel. Right coronary artery (RCA): Mild calcification. Mild disease in the proximal and mid vessel to 20%. Left ventriculography: Left ventricular systolic function is normal, LVEF is estimated at 55-65%, there is no significant mitral regurgitation       EKG: No EKG today  Recent Labs: 01/25/2020: TSH 5.379 02/12/2020: B Natriuretic Peptide 62.2 09/29/2020: Pro B Natriuretic peptide (BNP) 58.0 11/04/2020: Magnesium 1.8 12/06/2020: ALT 16; BUN 11; Creatinine, Ser 0.60; Hemoglobin 11.4; Platelets 304; Potassium 4.1; Sodium 131  Recent Lipid Panel    Component Value Date/Time   CHOL 143 09/29/2020 1425  CHOL 166 04/29/2020 0948   TRIG 387.0 (H) 09/29/2020 1425   HDL 42.20 09/29/2020 1425   HDL 65 04/29/2020 0948   CHOLHDL 3 09/29/2020 1425   VLDL 77.4 (H) 09/29/2020 1425   LDLCALC 85 04/29/2020 0948   LDLDIRECT 69.0 09/29/2020 1425   Home Medications   Current Meds  Medication Sig   acetaminophen (TYLENOL) 325 MG tablet Take 2 tablets (650 mg total) by mouth every 6 (six) hours as needed for mild pain, fever or headache (or Fever >/= 101).   Amino Acids-Protein Hydrolys (PRO-STAT AWC) LIQD Take 30 mLs by mouth daily.   atorvastatin (LIPITOR) 20 MG tablet Take 1 tablet (20 mg total) by mouth daily.   benzocaine (ORAJEL) 10 % mucosal gel Use as directed in the mouth or throat as needed for mouth pain.   chlorhexidine (PERIDEX) 0.12 % solution Use as directed 15 mLs in the mouth or throat 4 (four) times daily.   docusate (COLACE) 50 MG/5ML liquid Take 50 mg by mouth daily. Taking 10 ml daily   famotidine (PEPCID)  40 MG/5ML suspension Take 2.5 mLs (20 mg total) by mouth daily.   fluticasone (FLONASE) 50 MCG/ACT nasal spray Place 2 sprays into both nostrils daily as needed for allergies or rhinitis.   gabapentin (NEURONTIN) 300 MG capsule Take 1 capsule (300 mg total) by mouth 3 (three) times daily.   lactose free nutrition (BOOST) LIQD Place 1 Container into feeding tube in the morning and at bedtime.   lipase/protease/amylase (CREON) 36000 UNITS CPEP capsule Take 1 capsule (36,000 Units total) by mouth 3 (three) times daily before meals.   loperamide (IMODIUM A-D) 2 MG tablet Take 2 mg by mouth as needed for diarrhea or loose stools.   midodrine (PROAMATINE) 5 MG tablet Take 1 tablet (5 mg total) by mouth 3 (three) times daily with meals.   mirtazapine (REMERON SOL-TAB) 30 MG disintegrating tablet Take 1 tablet (30 mg total) by mouth at bedtime.   mirtazapine (REMERON) 30 MG tablet Place 15 mg into feeding tube at bedtime.   Multiple Vitamin (MULTIVITAMIN WITH MINERALS) TABS tablet Take 1 tablet by mouth daily.   nitroGLYCERIN (NITROSTAT) 0.3 MG SL tablet Place 0.3 mg under the tongue every 5 (five) minutes as needed for chest pain.   Nutritional Supplements (ENSURE ORIGINAL PO) 237 mLs by PEG Tube route daily.   Nutritional Supplements (FEEDING SUPPLEMENT, OSMOLITE 1.2 CAL,) LIQD Place 50 mL/hr into feeding tube continuous.   nystatin (MYCOSTATIN/NYSTOP) powder Apply 1 application topically See admin instructions. Apply qd around peg of circle   oxyCODONE (OXY IR/ROXICODONE) 5 MG immediate release tablet Take 1 tablet (5 mg total) by mouth every 4 (four) hours as needed for severe pain.   polyethylene glycol (MIRALAX / GLYCOLAX) 17 g packet Take 17 g by mouth daily.   predniSONE (DELTASONE) 5 MG tablet TAKE 1 TABLET DAILY WITH BREAKFAST     Review of Systems    All other systems reviewed and are otherwise negative except as noted above.  Physical Exam    VS:  BP (!) 100/58   Pulse (!) 106   Ht 5'  4.5" (1.638 m)   Wt 95 lb 9.6 oz (43.4 kg)   SpO2 99%   BMI 16.16 kg/m  , BMI Body mass index is 16.16 kg/m.  Wt Readings from Last 3 Encounters:  01/08/21 95 lb 9.6 oz (43.4 kg)  01/06/21 92 lb (41.7 kg)  12/23/20 90 lb 11.2 oz (41.1 kg)  GEN: frail-appearing,  in no acute distress. HEENT: normal. Neck: Supple, no JVD, carotid bruits, or masses. Cardiac: RRR, tachycardic, no murmurs, rubs, or gallops. No clubbing, cyanosis, edema.  Radials/PT 2+ and equal bilaterally.  Respiratory:  Respirations regular and unlabored, clear to auscultation bilaterally. On 2L O2.  GI: Soft, nontender, nondistended. MS: No deformity or atrophy. Skin: Warm and dry, no rash. Neuro:  Strength and sensation are intact. Psych: Normal affect.  Assessment & Plan    Mild nonobstructive CAD - Stable with no anginal symptoms. No indication for ischemic evaluation. GDMT includes statin which was resumed today, as below. No beta blocker due to pulmonary fibrosis.   Tachycardia - Likely multifactorial ILD, deconditioning dehydration. EKG during recent admissions without significant arrhythmia. Defer ZIO monitor at this time. Unable to use BB due to ILD. BP too low for CCB. Recommended she increase PO hydration and she was agreeable.   Pulmonary fibrosis - Continue to follow with pulmonology.  No changes in symptoms since discontinuation of CellCept and Ofev.  Hx of DVT/PE - Not on anticoagulation due to concern for fall.   HLD - 09/29/20 total cholesterol 143, LDL 69.   Her atorvastatin was discontinued during hospitalization 08/2020 due to elevated liver enzymes.  Her liver function has since normalized.  We will resume atorvastatin 20 mg daily and ask SNF to recheck CMP in 6 weeks.  Chronic diastolic heart failure - Euvolemic and well compensated on exam. No indication for diuretic therapy at this time.  Educated to elevate lower extremities when sitting to prevent lower extremity edema.  Hypotension - On  midodrine per SNF notes. Reports intermittent lightheadedness. Reviewed orthostatic precautions.   Disposition: Follow up in 6 month(s) with Dr. Angelena Form or APP   Signed, Loel Dubonnet, NP 01/08/2021, 5:14 PM [place]

## 2021-01-12 DIAGNOSIS — R64 Cachexia: Secondary | ICD-10-CM | POA: Diagnosis not present

## 2021-01-12 DIAGNOSIS — Z931 Gastrostomy status: Secondary | ICD-10-CM | POA: Diagnosis not present

## 2021-01-12 DIAGNOSIS — J8489 Other specified interstitial pulmonary diseases: Secondary | ICD-10-CM | POA: Diagnosis not present

## 2021-01-12 DIAGNOSIS — I10 Essential (primary) hypertension: Secondary | ICD-10-CM | POA: Diagnosis not present

## 2021-01-12 DIAGNOSIS — R2681 Unsteadiness on feet: Secondary | ICD-10-CM | POA: Diagnosis not present

## 2021-01-12 DIAGNOSIS — L89152 Pressure ulcer of sacral region, stage 2: Secondary | ICD-10-CM | POA: Diagnosis not present

## 2021-01-12 DIAGNOSIS — R296 Repeated falls: Secondary | ICD-10-CM | POA: Diagnosis not present

## 2021-01-12 DIAGNOSIS — J9610 Chronic respiratory failure, unspecified whether with hypoxia or hypercapnia: Secondary | ICD-10-CM | POA: Diagnosis not present

## 2021-01-12 DIAGNOSIS — E782 Mixed hyperlipidemia: Secondary | ICD-10-CM | POA: Diagnosis not present

## 2021-01-12 DIAGNOSIS — K9420 Gastrostomy complication, unspecified: Secondary | ICD-10-CM | POA: Diagnosis not present

## 2021-01-12 DIAGNOSIS — M6281 Muscle weakness (generalized): Secondary | ICD-10-CM | POA: Diagnosis not present

## 2021-01-12 DIAGNOSIS — D649 Anemia, unspecified: Secondary | ICD-10-CM | POA: Diagnosis not present

## 2021-01-12 DIAGNOSIS — Z86711 Personal history of pulmonary embolism: Secondary | ICD-10-CM | POA: Diagnosis not present

## 2021-01-12 DIAGNOSIS — L03311 Cellulitis of abdominal wall: Secondary | ICD-10-CM | POA: Diagnosis not present

## 2021-01-14 ENCOUNTER — Other Ambulatory Visit (HOSPITAL_COMMUNITY): Payer: Self-pay | Admitting: Nurse Practitioner

## 2021-01-14 DIAGNOSIS — J8489 Other specified interstitial pulmonary diseases: Secondary | ICD-10-CM | POA: Diagnosis not present

## 2021-01-14 DIAGNOSIS — L89152 Pressure ulcer of sacral region, stage 2: Secondary | ICD-10-CM | POA: Diagnosis not present

## 2021-01-14 DIAGNOSIS — R633 Feeding difficulties, unspecified: Secondary | ICD-10-CM

## 2021-01-14 DIAGNOSIS — Z86711 Personal history of pulmonary embolism: Secondary | ICD-10-CM | POA: Diagnosis not present

## 2021-01-14 DIAGNOSIS — J9610 Chronic respiratory failure, unspecified whether with hypoxia or hypercapnia: Secondary | ICD-10-CM | POA: Diagnosis not present

## 2021-01-14 DIAGNOSIS — R64 Cachexia: Secondary | ICD-10-CM | POA: Diagnosis not present

## 2021-01-14 DIAGNOSIS — R2681 Unsteadiness on feet: Secondary | ICD-10-CM | POA: Diagnosis not present

## 2021-01-14 DIAGNOSIS — E782 Mixed hyperlipidemia: Secondary | ICD-10-CM | POA: Diagnosis not present

## 2021-01-14 DIAGNOSIS — Z931 Gastrostomy status: Secondary | ICD-10-CM | POA: Diagnosis not present

## 2021-01-14 DIAGNOSIS — D649 Anemia, unspecified: Secondary | ICD-10-CM | POA: Diagnosis not present

## 2021-01-14 DIAGNOSIS — I10 Essential (primary) hypertension: Secondary | ICD-10-CM | POA: Diagnosis not present

## 2021-01-14 DIAGNOSIS — M6281 Muscle weakness (generalized): Secondary | ICD-10-CM | POA: Diagnosis not present

## 2021-01-14 DIAGNOSIS — L03311 Cellulitis of abdominal wall: Secondary | ICD-10-CM | POA: Diagnosis not present

## 2021-01-15 ENCOUNTER — Ambulatory Visit (HOSPITAL_COMMUNITY)
Admission: RE | Admit: 2021-01-15 | Discharge: 2021-01-15 | Disposition: A | Discharge status: Home / Self Care | Payer: Medicare Other | Source: Ambulatory Visit | Attending: Nurse Practitioner | Admitting: Nurse Practitioner

## 2021-01-15 DIAGNOSIS — R633 Feeding difficulties, unspecified: Secondary | ICD-10-CM

## 2021-01-15 DIAGNOSIS — Z431 Encounter for attention to gastrostomy: Secondary | ICD-10-CM | POA: Insufficient documentation

## 2021-01-15 DIAGNOSIS — K9423 Gastrostomy malfunction: Secondary | ICD-10-CM | POA: Diagnosis not present

## 2021-01-15 HISTORY — PX: IR REPLC GASTRO/COLONIC TUBE PERCUT W/FLUORO: IMG2333

## 2021-01-15 MED ORDER — LIDOCAINE VISCOUS HCL 2 % MT SOLN
OROMUCOSAL | Status: AC
  Filled 2021-01-15: qty 15

## 2021-01-15 MED ORDER — IOHEXOL 300 MG/ML  SOLN
50.0000 mL | Freq: Once | INTRAMUSCULAR | Status: AC | PRN
  Administered 2021-01-15: 15 mL

## 2021-01-15 NOTE — Procedures (Signed)
Interventional Radiology Progress Note  Procedure:   Routine exchange of gastrostomy, for "leakage."    New 32F balloon retention gastrostomy.  .  Complications: None Recommendations:  - Ok to use    Signed,  Dulcy Fanny. Earleen Newport, DO

## 2021-01-18 ENCOUNTER — Other Ambulatory Visit: Payer: Self-pay | Admitting: Pulmonary Disease

## 2021-01-18 DIAGNOSIS — J9610 Chronic respiratory failure, unspecified whether with hypoxia or hypercapnia: Secondary | ICD-10-CM | POA: Diagnosis not present

## 2021-01-18 DIAGNOSIS — R64 Cachexia: Secondary | ICD-10-CM | POA: Diagnosis not present

## 2021-01-18 DIAGNOSIS — Z931 Gastrostomy status: Secondary | ICD-10-CM | POA: Diagnosis not present

## 2021-01-18 DIAGNOSIS — I10 Essential (primary) hypertension: Secondary | ICD-10-CM | POA: Diagnosis not present

## 2021-01-18 DIAGNOSIS — J8489 Other specified interstitial pulmonary diseases: Secondary | ICD-10-CM | POA: Diagnosis not present

## 2021-01-18 DIAGNOSIS — L03311 Cellulitis of abdominal wall: Secondary | ICD-10-CM | POA: Diagnosis not present

## 2021-01-18 DIAGNOSIS — D649 Anemia, unspecified: Secondary | ICD-10-CM | POA: Diagnosis not present

## 2021-01-18 DIAGNOSIS — L89152 Pressure ulcer of sacral region, stage 2: Secondary | ICD-10-CM | POA: Diagnosis not present

## 2021-01-18 DIAGNOSIS — R2681 Unsteadiness on feet: Secondary | ICD-10-CM | POA: Diagnosis not present

## 2021-01-18 DIAGNOSIS — M6281 Muscle weakness (generalized): Secondary | ICD-10-CM | POA: Diagnosis not present

## 2021-01-18 DIAGNOSIS — E782 Mixed hyperlipidemia: Secondary | ICD-10-CM | POA: Diagnosis not present

## 2021-01-18 DIAGNOSIS — Z86711 Personal history of pulmonary embolism: Secondary | ICD-10-CM | POA: Diagnosis not present

## 2021-01-19 ENCOUNTER — Other Ambulatory Visit: Payer: Self-pay | Admitting: *Deleted

## 2021-01-19 NOTE — Patient Outreach (Signed)
Member screened for potential [facility] Care Management needs. Mrs. Thalmann remains in [facility].   Update received from [facility] indicating family is hoping member can still return home. However, transition plans are not certain.  Will continue to follow.   Marthenia Rolling, MSN, RN,BSN [place] Acute Care Coordinator [phone] [facility]) [phone]  (Toll free office)

## 2021-01-21 DIAGNOSIS — K5901 Slow transit constipation: Secondary | ICD-10-CM | POA: Diagnosis not present

## 2021-01-21 DIAGNOSIS — R296 Repeated falls: Secondary | ICD-10-CM | POA: Diagnosis not present

## 2021-01-21 DIAGNOSIS — E640 Sequelae of protein-calorie malnutrition: Secondary | ICD-10-CM | POA: Diagnosis not present

## 2021-01-21 DIAGNOSIS — K219 Gastro-esophageal reflux disease without esophagitis: Secondary | ICD-10-CM | POA: Diagnosis not present

## 2021-01-21 DIAGNOSIS — M6281 Muscle weakness (generalized): Secondary | ICD-10-CM | POA: Diagnosis not present

## 2021-01-21 DIAGNOSIS — E782 Mixed hyperlipidemia: Secondary | ICD-10-CM | POA: Diagnosis not present

## 2021-01-21 DIAGNOSIS — G894 Chronic pain syndrome: Secondary | ICD-10-CM | POA: Diagnosis not present

## 2021-01-21 DIAGNOSIS — R627 Adult failure to thrive: Secondary | ICD-10-CM | POA: Diagnosis not present

## 2021-01-21 DIAGNOSIS — S31105D Unspecified open wound of abdominal wall, periumbilic region without penetration into peritoneal cavity, subsequent encounter: Secondary | ICD-10-CM | POA: Diagnosis not present

## 2021-01-21 DIAGNOSIS — R1312 Dysphagia, oropharyngeal phase: Secondary | ICD-10-CM | POA: Diagnosis not present

## 2021-01-21 DIAGNOSIS — Z931 Gastrostomy status: Secondary | ICD-10-CM | POA: Diagnosis not present

## 2021-01-21 DIAGNOSIS — M0689 Other specified rheumatoid arthritis, multiple sites: Secondary | ICD-10-CM | POA: Diagnosis not present

## 2021-01-26 DIAGNOSIS — M6281 Muscle weakness (generalized): Secondary | ICD-10-CM | POA: Diagnosis not present

## 2021-01-26 DIAGNOSIS — L89152 Pressure ulcer of sacral region, stage 2: Secondary | ICD-10-CM | POA: Diagnosis not present

## 2021-01-26 DIAGNOSIS — Z931 Gastrostomy status: Secondary | ICD-10-CM | POA: Diagnosis not present

## 2021-01-26 DIAGNOSIS — E782 Mixed hyperlipidemia: Secondary | ICD-10-CM | POA: Diagnosis not present

## 2021-01-26 DIAGNOSIS — R2681 Unsteadiness on feet: Secondary | ICD-10-CM | POA: Diagnosis not present

## 2021-01-26 DIAGNOSIS — J8489 Other specified interstitial pulmonary diseases: Secondary | ICD-10-CM | POA: Diagnosis not present

## 2021-01-26 DIAGNOSIS — J9610 Chronic respiratory failure, unspecified whether with hypoxia or hypercapnia: Secondary | ICD-10-CM | POA: Diagnosis not present

## 2021-01-26 DIAGNOSIS — I10 Essential (primary) hypertension: Secondary | ICD-10-CM | POA: Diagnosis not present

## 2021-01-26 DIAGNOSIS — L03311 Cellulitis of abdominal wall: Secondary | ICD-10-CM | POA: Diagnosis not present

## 2021-01-26 DIAGNOSIS — R64 Cachexia: Secondary | ICD-10-CM | POA: Diagnosis not present

## 2021-01-26 DIAGNOSIS — Z86711 Personal history of pulmonary embolism: Secondary | ICD-10-CM | POA: Diagnosis not present

## 2021-01-26 DIAGNOSIS — D649 Anemia, unspecified: Secondary | ICD-10-CM | POA: Diagnosis not present

## 2021-01-29 DIAGNOSIS — U071 COVID-19: Secondary | ICD-10-CM | POA: Diagnosis not present

## 2021-01-29 DIAGNOSIS — R296 Repeated falls: Secondary | ICD-10-CM | POA: Diagnosis not present

## 2021-01-29 DIAGNOSIS — J1281 Pneumonia due to SARS-associated coronavirus: Secondary | ICD-10-CM | POA: Diagnosis not present

## 2021-01-30 ENCOUNTER — Telehealth: Payer: Self-pay | Admitting: Pulmonary Disease

## 2021-01-30 MED ORDER — NIRMATRELVIR/RITONAVIR (PAXLOVID)TABLET: 3.0000 | ORAL_TABLET | Freq: Two times a day (BID) | ORAL | 0 refills | Status: AC

## 2021-01-30 NOTE — Telephone Encounter (Signed)
Patient's son called answering service Patient tested positive for COVID Currently in a nursing facility, patient noted to be congested  Requested for Paxlovid to be called into pharmacy  Paxlovid called into [place]

## 2021-02-01 DIAGNOSIS — U071 COVID-19: Secondary | ICD-10-CM | POA: Diagnosis not present

## 2021-02-01 DIAGNOSIS — J1281 Pneumonia due to SARS-associated coronavirus: Secondary | ICD-10-CM | POA: Diagnosis not present

## 2021-02-01 DIAGNOSIS — J9621 Acute and chronic respiratory failure with hypoxia: Secondary | ICD-10-CM | POA: Diagnosis not present

## 2021-02-02 DIAGNOSIS — I10 Essential (primary) hypertension: Secondary | ICD-10-CM | POA: Diagnosis not present

## 2021-02-02 DIAGNOSIS — Z86711 Personal history of pulmonary embolism: Secondary | ICD-10-CM | POA: Diagnosis not present

## 2021-02-02 DIAGNOSIS — D649 Anemia, unspecified: Secondary | ICD-10-CM | POA: Diagnosis not present

## 2021-02-02 DIAGNOSIS — R2681 Unsteadiness on feet: Secondary | ICD-10-CM | POA: Diagnosis not present

## 2021-02-02 DIAGNOSIS — Z931 Gastrostomy status: Secondary | ICD-10-CM | POA: Diagnosis not present

## 2021-02-02 DIAGNOSIS — R64 Cachexia: Secondary | ICD-10-CM | POA: Diagnosis not present

## 2021-02-02 DIAGNOSIS — L89152 Pressure ulcer of sacral region, stage 2: Secondary | ICD-10-CM | POA: Diagnosis not present

## 2021-02-02 DIAGNOSIS — E782 Mixed hyperlipidemia: Secondary | ICD-10-CM | POA: Diagnosis not present

## 2021-02-02 DIAGNOSIS — M6281 Muscle weakness (generalized): Secondary | ICD-10-CM | POA: Diagnosis not present

## 2021-02-02 DIAGNOSIS — L03311 Cellulitis of abdominal wall: Secondary | ICD-10-CM | POA: Diagnosis not present

## 2021-02-02 DIAGNOSIS — J9610 Chronic respiratory failure, unspecified whether with hypoxia or hypercapnia: Secondary | ICD-10-CM | POA: Diagnosis not present

## 2021-02-02 DIAGNOSIS — J8489 Other specified interstitial pulmonary diseases: Secondary | ICD-10-CM | POA: Diagnosis not present

## 2021-02-04 ENCOUNTER — Other Ambulatory Visit: Payer: Self-pay | Admitting: *Deleted

## 2021-02-04 NOTE — Patient Outreach (Signed)
[facility]- Acute Care Coordinator follow up. Mrs. Politte remains in [facility].   Update received from [place] indicating member likely to transition to LTC.   No identifiable [facility] Care Management needs if plan remains LTC.    Marthenia Rolling, MSN, RN,BSN [place] Acute Care Coordinator [phone] [facility]) [phone]  (Toll free office)

## 2021-02-08 ENCOUNTER — Ambulatory Visit: Payer: Medicare Other | Admitting: Podiatry

## 2021-02-09 DIAGNOSIS — G894 Chronic pain syndrome: Secondary | ICD-10-CM | POA: Diagnosis not present

## 2021-02-09 DIAGNOSIS — R1312 Dysphagia, oropharyngeal phase: Secondary | ICD-10-CM | POA: Diagnosis not present

## 2021-02-09 DIAGNOSIS — M0689 Other specified rheumatoid arthritis, multiple sites: Secondary | ICD-10-CM | POA: Diagnosis not present

## 2021-02-09 DIAGNOSIS — E782 Mixed hyperlipidemia: Secondary | ICD-10-CM | POA: Diagnosis not present

## 2021-02-09 DIAGNOSIS — Z931 Gastrostomy status: Secondary | ICD-10-CM | POA: Diagnosis not present

## 2021-02-09 DIAGNOSIS — K219 Gastro-esophageal reflux disease without esophagitis: Secondary | ICD-10-CM | POA: Diagnosis not present

## 2021-02-09 DIAGNOSIS — I9589 Other hypotension: Secondary | ICD-10-CM | POA: Diagnosis not present

## 2021-02-09 DIAGNOSIS — E640 Sequelae of protein-calorie malnutrition: Secondary | ICD-10-CM | POA: Diagnosis not present

## 2021-02-09 DIAGNOSIS — M6281 Muscle weakness (generalized): Secondary | ICD-10-CM | POA: Diagnosis not present

## 2021-02-09 DIAGNOSIS — U099 Post covid-19 condition, unspecified: Secondary | ICD-10-CM | POA: Diagnosis not present

## 2021-02-16 DIAGNOSIS — L03311 Cellulitis of abdominal wall: Secondary | ICD-10-CM | POA: Diagnosis not present

## 2021-02-16 DIAGNOSIS — R2681 Unsteadiness on feet: Secondary | ICD-10-CM | POA: Diagnosis not present

## 2021-02-16 DIAGNOSIS — J9610 Chronic respiratory failure, unspecified whether with hypoxia or hypercapnia: Secondary | ICD-10-CM | POA: Diagnosis not present

## 2021-02-16 DIAGNOSIS — D649 Anemia, unspecified: Secondary | ICD-10-CM | POA: Diagnosis not present

## 2021-02-16 DIAGNOSIS — I10 Essential (primary) hypertension: Secondary | ICD-10-CM | POA: Diagnosis not present

## 2021-02-16 DIAGNOSIS — M6281 Muscle weakness (generalized): Secondary | ICD-10-CM | POA: Diagnosis not present

## 2021-02-16 DIAGNOSIS — R64 Cachexia: Secondary | ICD-10-CM | POA: Diagnosis not present

## 2021-02-16 DIAGNOSIS — L89152 Pressure ulcer of sacral region, stage 2: Secondary | ICD-10-CM | POA: Diagnosis not present

## 2021-02-16 DIAGNOSIS — Z86711 Personal history of pulmonary embolism: Secondary | ICD-10-CM | POA: Diagnosis not present

## 2021-02-16 DIAGNOSIS — Z931 Gastrostomy status: Secondary | ICD-10-CM | POA: Diagnosis not present

## 2021-02-16 DIAGNOSIS — J8489 Other specified interstitial pulmonary diseases: Secondary | ICD-10-CM | POA: Diagnosis not present

## 2021-02-16 DIAGNOSIS — E782 Mixed hyperlipidemia: Secondary | ICD-10-CM | POA: Diagnosis not present

## 2021-02-24 ENCOUNTER — Encounter (HOSPITAL_COMMUNITY): Payer: Self-pay | Admitting: Emergency Medicine

## 2021-02-24 ENCOUNTER — Emergency Department (HOSPITAL_COMMUNITY)
Admission: EM | Admit: 2021-02-24 | Discharge: 2021-02-24 | Disposition: A | Discharge status: Home / Self Care | Payer: Medicare Other | Attending: Emergency Medicine | Admitting: Emergency Medicine

## 2021-02-24 ENCOUNTER — Other Ambulatory Visit: Payer: Self-pay

## 2021-02-24 DIAGNOSIS — R531 Weakness: Secondary | ICD-10-CM | POA: Diagnosis not present

## 2021-02-24 DIAGNOSIS — K9423 Gastrostomy malfunction: Secondary | ICD-10-CM | POA: Insufficient documentation

## 2021-02-24 DIAGNOSIS — Z79899 Other long term (current) drug therapy: Secondary | ICD-10-CM | POA: Diagnosis not present

## 2021-02-24 DIAGNOSIS — Z96653 Presence of artificial knee joint, bilateral: Secondary | ICD-10-CM | POA: Diagnosis not present

## 2021-02-24 DIAGNOSIS — R1032 Left lower quadrant pain: Secondary | ICD-10-CM | POA: Diagnosis not present

## 2021-02-24 DIAGNOSIS — I2510 Atherosclerotic heart disease of native coronary artery without angina pectoris: Secondary | ICD-10-CM | POA: Diagnosis not present

## 2021-02-24 DIAGNOSIS — Z87891 Personal history of nicotine dependence: Secondary | ICD-10-CM | POA: Diagnosis not present

## 2021-02-24 DIAGNOSIS — T85598D Other mechanical complication of other gastrointestinal prosthetic devices, implants and grafts, subsequent encounter: Secondary | ICD-10-CM

## 2021-02-24 DIAGNOSIS — I5032 Chronic diastolic (congestive) heart failure: Secondary | ICD-10-CM | POA: Diagnosis not present

## 2021-02-24 DIAGNOSIS — Z955 Presence of coronary angioplasty implant and graft: Secondary | ICD-10-CM | POA: Diagnosis not present

## 2021-02-24 DIAGNOSIS — R0902 Hypoxemia: Secondary | ICD-10-CM | POA: Diagnosis not present

## 2021-02-24 DIAGNOSIS — Z743 Need for continuous supervision: Secondary | ICD-10-CM | POA: Diagnosis not present

## 2021-02-24 NOTE — ED Provider Notes (Signed)
[place] [facility] Provider Note   CSN: [id number] Arrival date & time: 02/24/21  1652     History Chief Complaint  Patient presents with   Feeding Tube Issue    [name] [name] is a 87 y.o. female with a significant PMH of failure to thrive with secondary malnutrition requiring feeding tube placement in February of 2022 presented to the ED via EMS for feeding tube being clogged. She states that she has had problems with the feeding tube hurting since it was placed. She states that she has had it replaced 5 times since February. Per chart review, she has visited the ED multiple times for feeding tube concerns. It was last exchanged on 01/15/2021. She denies any drainage from the site. She has had redness and tenderness around the site that she is using Nystatin powder for. Per EMS, she was sent here because her feeding tube was clogged. The patient, however, wants [place] to remove the feeding tube completely. She states that she originally had the tube placed because she wasn't eating, however, she started eating again about 5 months ago and rarely uses the feeding tube. Patient denies any fevers, chills, nausea, vomiting, or changes in BM.      Past Medical History:  Diagnosis Date   Allergic rhinitis    Allergy    CAD (coronary artery disease)    Mild CAD by cath 2008   CHF (congestive heart failure) (HCC)    DJD (degenerative joint disease)    rheumatoid   DVT (deep venous thrombosis) (HCC)    Dyslipidemia    History of echocardiogram    Echo 12/2018: EF 60-65, mod asymmetric LVH, Gr 1 DD   History of nuclear stress test    Myoview 5/17:  EF 74%, normal perfusion, low risk // Myoview 12/2018:  EF 89, very mild ischemia in inferoapical wall; Low Risk   History of pulmonary embolism    Hyperlipidemia    Insomnia    Osteoporosis    Psoriasis    Pulmonary fibrosis ([place])    Rheumatoid arthritis ([place])     Patient Active Problem List   Diagnosis Date Noted    Debility 11/04/2020   Dysphagia    Oral thrush    Encounter for dental examination    Facial swelling    Atrophy of edentulous alveolar ridge    Abdominal wall cellulitis 10/24/2020   Severe sepsis ([place]) 10/23/2020   Abdominal pain 10/20/2020   Diarrhea 10/02/2020   Pressure injury of skin 09/06/2020   Acute on chronic respiratory failure with hypoxemia ([place]) 08/23/2020   Protein-calorie malnutrition, severe 08/20/2020   Fall 01/25/2020   Other fatigue 01/03/2020   Left shoulder pain 01/03/2020   Urinary frequency 01/03/2020   Acute otalgia, left 09/06/2019   Unintentional weight loss 07/18/2019   Personal history of PE (pulmonary embolism) 04/23/2019   Headache 02/11/2019   Iron deficiency anemia 12/21/2018   Cold sore 10/23/2018   Blood in stool 09/14/2018   Guttate psoriasis 07/19/2018   Therapeutic drug monitoring 07/17/2018   Chronic diastolic CHF (congestive heart failure) ([place]) 12/19/2017   Rash 08/11/2017   Chronic respiratory failure with hypoxia ([place]) 03/30/2017   Angular cheilitis 03/08/2017   Leg pain 10/25/2016   Back pain 08/05/2016   RA (rheumatoid arthritis) ([place]) 03/31/2015   Allergic rhinitis    Varicose vein 09/11/2014   ILD (interstitial lung disease) ([place]) 06/25/2012   Chest pain 02/27/2012   CAD (coronary artery disease) 02/27/2012  Pruritus 08/18/2009   Postinflammatory pulmonary fibrosis / RA ILD  06/30/2008   Constipation 01/03/2008   Dyslipidemia 06/16/2007    Past Surgical History:  Procedure Laterality Date   ABDOMINAL HYSTERECTOMY  1974   APPENDECTOMY  1959   CATARACT EXTRACTION     IR [place] TUBE CHANGE  11/05/2020   IR [place] GASTRO/COLONIC TUBE PERCUT W/FLUORO  10/29/2020   IR [place] GASTRO/COLONIC TUBE PERCUT W/FLUORO  11/17/2020   IR [place] GASTRO/COLONIC TUBE PERCUT W/FLUORO  01/15/2021   LAPAROTOMY N/A 08/23/2020   Procedure: EXPLORATORY LAPAROTOMY small bowel resection gastrostomy tube placement;  Surgeon: Dwan Bolt, MD;   Location: [facility];  Service: General;  Laterality: N/A;   LEFT HEART CATHETERIZATION WITH CORONARY ANGIOGRAM N/A 06/18/2013   Procedure: LEFT HEART CATHETERIZATION WITH CORONARY ANGIOGRAM;  Surgeon: Peter M Martinique, MD;  Location: [facility] CATH LAB;  Service: Cardiovascular;  Laterality: N/A;   TONSILLECTOMY  1941, [place], 2007   Bilateral     OB History     Gravida  4   Para  2   Term  2   Preterm      AB      Living  0      SAB      IAB      Ectopic      Multiple      Live Births              Family History  Problem Relation Age of Onset   Kidney disease Daughter 41   Cancer Daughter    Heart attack Father 51   Alzheimer's disease Sister    Heart disease Sister    Alzheimer's disease Sister     Social History   Tobacco Use   Smoking status: Former    Packs/day: 0.50    Years: 20.00    Pack years: 10.00    Types: Cigarettes    Quit date: 07/11/1973    Years since quitting: 47.6   Smokeless tobacco: Never  Vaping Use   Vaping Use: Never used  Substance Use Topics   Alcohol use: No   Drug use: No    Home Medications Prior to Admission medications   Medication Sig Start Date End Date Taking? Authorizing Provider  acetaminophen (TYLENOL) 325 MG tablet Take 2 tablets (650 mg total) by mouth every 6 (six) hours as needed for mild pain, fever or headache (or Fever >/= 101). 11/04/20   Gonfa, Taye T, MD  Amino Acids-Protein Hydrolys (PRO-STAT AWC) LIQD Take 30 mLs by mouth daily.    [provider]  atorvastatin (LIPITOR) 20 MG tablet Take 1 tablet (20 mg total) by mouth daily. 01/08/21 07/07/21  Loel Dubonnet, NP  benzocaine (ORAJEL) 10 % mucosal gel Use as directed in the mouth or throat as needed for mouth pain. 11/23/20   Angiulli, Lavon Paganini, PA-C  chlorhexidine (PERIDEX) 0.12 % solution Use as directed 15 mLs in the mouth or throat 4 (four) times daily. 11/23/20   Angiulli, Lavon Paganini, PA-C  docusate (COLACE) 50 MG/5ML  liquid Take 50 mg by mouth daily. Taking 10 ml daily    [provider]  famotidine (PEPCID) 40 MG/5ML suspension Take 2.5 mLs (20 mg total) by mouth daily. 11/23/20   Angiulli, Lavon Paganini, PA-C  fluticasone (FLONASE) 50 MCG/ACT nasal spray Place 2 sprays into both nostrils daily as needed for allergies or rhinitis. 03/17/15   Hoyt Koch, MD  gabapentin (  NEURONTIN) 300 MG capsule Take 1 capsule (300 mg total) by mouth 3 (three) times daily. 11/23/20   Angiulli, Lavon Paganini, PA-C  lactose free nutrition (BOOST) LIQD Place 1 Container into feeding tube in the morning and at bedtime.    [provider]  lipase/protease/amylase (CREON) 36000 UNITS CPEP capsule Take 1 capsule (36,000 Units total) by mouth 3 (three) times daily before meals. 10/09/20   Hoyt Koch, MD  loperamide (IMODIUM A-D) 2 MG tablet Take 2 mg by mouth as needed for diarrhea or loose stools.    [provider]  midodrine (PROAMATINE) 5 MG tablet Take 1 tablet (5 mg total) by mouth 3 (three) times daily with meals. 11/23/20   Angiulli, Lavon Paganini, PA-C  mirtazapine (REMERON SOL-TAB) 30 MG disintegrating tablet Take 1 tablet (30 mg total) by mouth at bedtime. 11/23/20   Angiulli, Lavon Paganini, PA-C  mirtazapine (REMERON) 30 MG tablet Place 15 mg into feeding tube at bedtime.    [provider]  Multiple Vitamin (MULTIVITAMIN WITH MINERALS) TABS tablet Take 1 tablet by mouth daily. 11/05/20   Mercy Riding, MD  mycophenolate (CELLCEPT) 500 MG tablet TAKE 2 TABLETS BY MOUTH TWICE DAILY 01/18/21   Mannam, Praveen, MD  nitroGLYCERIN (NITROSTAT) 0.3 MG SL tablet Place 0.3 mg under the tongue every 5 (five) minutes as needed for chest pain.    [provider]  nitroGLYCERIN (NITROSTAT) 0.4 MG SL tablet Place 1 tablet (0.4 mg total) under the tongue every 5 (five) minutes as needed for chest pain. 06/29/20 09/27/20  Burnell Blanks, MD  Nutritional Supplements (ENSURE ORIGINAL PO) 237 mLs by  PEG Tube route daily.    [provider]  Nutritional Supplements (FEEDING SUPPLEMENT, OSMOLITE 1.2 CAL,) LIQD Place 50 mL/hr into feeding tube continuous. 11/04/20   Mercy Riding, MD  nystatin (MYCOSTATIN/NYSTOP) powder Apply 1 application topically See admin instructions. Apply qd around peg of circle    [provider]  oxyCODONE (OXY IR/ROXICODONE) 5 MG immediate release tablet Take 1 tablet (5 mg total) by mouth every 4 (four) hours as needed for severe pain. 11/23/20   Angiulli, Lavon Paganini, PA-C  polyethylene glycol (MIRALAX / GLYCOLAX) 17 g packet Take 17 g by mouth daily. 11/23/20   Angiulli, Lavon Paganini, PA-C  predniSONE (DELTASONE) 5 MG tablet TAKE 1 TABLET DAILY WITH BREAKFAST 05/08/20   Mannam, Hart Robinsons, MD    Allergies    Crestor [rosuvastatin], Lactose intolerance (gi), Penicillins, and Imuran [azathioprine]  Review of Systems   Review of Systems  Constitutional:  Negative for appetite change, fatigue and fever.  HENT: Negative.    Eyes: Negative.   Respiratory:  Negative for shortness of breath.   Cardiovascular:  Negative for chest pain.  Gastrointestinal:  Positive for abdominal pain. Negative for abdominal distention, blood in stool, constipation, diarrhea, nausea and vomiting.  Endocrine: Negative.   Genitourinary: Negative.   Musculoskeletal: Negative.   Skin:  Positive for wound.       Surrounding G tube  Neurological: Negative.   Psychiatric/Behavioral:  Negative for agitation and confusion.    Physical Exam Updated Vital Signs BP 125/81   Pulse 97   Temp 98.3 F (36.8 C)   Resp 16   SpO2 98%   Physical Exam Vitals reviewed.  Constitutional:      Comments: cachectic  HENT:     Head: Normocephalic and atraumatic.  Eyes:     Conjunctiva/sclera: Conjunctivae normal.     Pupils: Pupils are equal,  round, and reactive to light.  Abdominal:     General: Abdomen is flat. There is no distension.     Palpations: Abdomen is soft.     Tenderness:  There is abdominal tenderness.     Comments: Erythema surrounding G tube. Tenderness to palpation surrounding G tube. No drainage from site. Balloon intact. Appears to be in correct location. Dressing clean and intact.   Skin:    General: Skin is warm and dry.  Neurological:     Mental Status: She is alert and oriented to person, place, and time. Mental status is at baseline.  Psychiatric:        Mood and Affect: Mood normal.        Behavior: Behavior normal.        Thought Content: Thought content normal.    ED Results / Procedures / Treatments   Labs (all labs ordered are listed, but only abnormal results are displayed) Labs Reviewed - No data to display  EKG None  Radiology No results found.  Procedures Procedures   Medications Ordered in ED Medications - No data to display  ED Course  I have reviewed the triage vital signs and the nursing notes.  Pertinent labs & imaging results that were available during my care of the patient were reviewed by me and considered in my medical decision making (see chart for details).  Clinical Course as of 02/24/21 1845  Wed Feb 24, 2021  1800 Patient seen and evaluated. VSS. Appears to be in no acute distress. Feeding tube evaluated. Does not appear to be out of place [GL]  1815 Discussed with patient that she needs to follow up with general surgery team that placed her feeding tube in order to get it out.  [GL]  1840 Flushed feeding tube with 40 cc sterile water. Flushes easily.  [GL]    Clinical Course User Index [GL] Theoden Mauch, Adora Fridge, PA-C   MDM Rules/Calculators/A&P                          This is a 87 y.o. female. Presented to ED with concern for feeding tube being clogged. Patient in no acute distress. Patient is afebrile and VSS. She is alert and oriented x 3. I evaluated feeding tube and noted redness and tenderness around site. She is currently being treated with Nystatin for this. She can continue using ointment for the  surrounding site. Flushed feeding tube with sterile water. It flushed easily with no drainage from site. Patient wants feeding tube removed, however since there is nothing wrong with the feeding tube, we will defer to general surgery team for removal. Patient stable for discharge.    Final Clinical Impression(s) / ED Diagnoses Final diagnoses:  Feeding tube dysfunction, subsequent encounter    Rx / DC Orders ED Discharge Orders     None        Adolphus Birchwood, PA-C 02/24/21 1845    Lacretia Leigh, MD 02/25/21 1346

## 2021-02-24 NOTE — ED Provider Notes (Signed)
I provided a substantive portion of the care of this patient.  I personally performed the entirety of the medical decision making for this encounter.      87 year old female presents requesting to have her feeding tube removed.  Tube may be clogged at this time.  Will check to function and reassess   Lacretia Leigh, MD 02/24/21 1752

## 2021-02-24 NOTE — ED Triage Notes (Signed)
Pt BIB [facility] from [facility], here for clogged feeding tube. Per EMS, pt has been doing well with PO intake and there has been discussion of removing the feeding tube. Pt A/Ox3 per baseline, denies complaints. Pt wears 2L O2 via [place] at baseline. Denies pain, N/V/D.

## 2021-03-02 DIAGNOSIS — R059 Cough, unspecified: Secondary | ICD-10-CM | POA: Diagnosis not present

## 2021-03-02 DIAGNOSIS — J189 Pneumonia, unspecified organism: Secondary | ICD-10-CM | POA: Diagnosis not present

## 2021-03-02 DIAGNOSIS — R0989 Other specified symptoms and signs involving the circulatory and respiratory systems: Secondary | ICD-10-CM | POA: Diagnosis not present

## 2021-03-02 DIAGNOSIS — T8189XA Other complications of procedures, not elsewhere classified, initial encounter: Secondary | ICD-10-CM | POA: Diagnosis not present

## 2021-03-03 ENCOUNTER — Other Ambulatory Visit: Payer: Self-pay

## 2021-03-03 ENCOUNTER — Emergency Department (HOSPITAL_COMMUNITY)
Admission: EM | Admit: 2021-03-03 | Discharge: 2021-03-03 | Disposition: A | Discharge status: Home / Self Care | Payer: Medicare Other | Attending: Emergency Medicine | Admitting: Emergency Medicine

## 2021-03-03 ENCOUNTER — Emergency Department (HOSPITAL_COMMUNITY): Payer: Medicare Other

## 2021-03-03 ENCOUNTER — Encounter (HOSPITAL_COMMUNITY): Payer: Self-pay

## 2021-03-03 DIAGNOSIS — I209 Angina pectoris, unspecified: Secondary | ICD-10-CM | POA: Diagnosis not present

## 2021-03-03 DIAGNOSIS — J158 Pneumonia due to other specified bacteria: Secondary | ICD-10-CM | POA: Diagnosis not present

## 2021-03-03 DIAGNOSIS — J9621 Acute and chronic respiratory failure with hypoxia: Secondary | ICD-10-CM | POA: Diagnosis not present

## 2021-03-03 DIAGNOSIS — Z87891 Personal history of nicotine dependence: Secondary | ICD-10-CM | POA: Diagnosis not present

## 2021-03-03 DIAGNOSIS — I9589 Other hypotension: Secondary | ICD-10-CM | POA: Diagnosis not present

## 2021-03-03 DIAGNOSIS — I2510 Atherosclerotic heart disease of native coronary artery without angina pectoris: Secondary | ICD-10-CM | POA: Diagnosis not present

## 2021-03-03 DIAGNOSIS — R410 Disorientation, unspecified: Secondary | ICD-10-CM | POA: Insufficient documentation

## 2021-03-03 DIAGNOSIS — K9423 Gastrostomy malfunction: Secondary | ICD-10-CM | POA: Diagnosis not present

## 2021-03-03 DIAGNOSIS — R9431 Abnormal electrocardiogram [ECG] [EKG]: Secondary | ICD-10-CM | POA: Diagnosis not present

## 2021-03-03 DIAGNOSIS — G894 Chronic pain syndrome: Secondary | ICD-10-CM | POA: Diagnosis not present

## 2021-03-03 DIAGNOSIS — Z955 Presence of coronary angioplasty implant and graft: Secondary | ICD-10-CM | POA: Diagnosis not present

## 2021-03-03 DIAGNOSIS — Z96653 Presence of artificial knee joint, bilateral: Secondary | ICD-10-CM | POA: Insufficient documentation

## 2021-03-03 DIAGNOSIS — U099 Post covid-19 condition, unspecified: Secondary | ICD-10-CM | POA: Diagnosis not present

## 2021-03-03 DIAGNOSIS — Z743 Need for continuous supervision: Secondary | ICD-10-CM | POA: Diagnosis not present

## 2021-03-03 DIAGNOSIS — R4182 Altered mental status, unspecified: Secondary | ICD-10-CM | POA: Diagnosis not present

## 2021-03-03 DIAGNOSIS — Z4682 Encounter for fitting and adjustment of non-vascular catheter: Secondary | ICD-10-CM | POA: Diagnosis not present

## 2021-03-03 DIAGNOSIS — M6281 Muscle weakness (generalized): Secondary | ICD-10-CM | POA: Diagnosis not present

## 2021-03-03 DIAGNOSIS — M0689 Other specified rheumatoid arthritis, multiple sites: Secondary | ICD-10-CM | POA: Diagnosis not present

## 2021-03-03 DIAGNOSIS — Z4659 Encounter for fitting and adjustment of other gastrointestinal appliance and device: Secondary | ICD-10-CM | POA: Diagnosis not present

## 2021-03-03 DIAGNOSIS — R1312 Dysphagia, oropharyngeal phase: Secondary | ICD-10-CM | POA: Diagnosis not present

## 2021-03-03 DIAGNOSIS — E782 Mixed hyperlipidemia: Secondary | ICD-10-CM | POA: Diagnosis not present

## 2021-03-03 DIAGNOSIS — J189 Pneumonia, unspecified organism: Secondary | ICD-10-CM | POA: Diagnosis not present

## 2021-03-03 DIAGNOSIS — I5032 Chronic diastolic (congestive) heart failure: Secondary | ICD-10-CM | POA: Diagnosis not present

## 2021-03-03 DIAGNOSIS — E640 Sequelae of protein-calorie malnutrition: Secondary | ICD-10-CM | POA: Diagnosis not present

## 2021-03-03 DIAGNOSIS — R5383 Other fatigue: Secondary | ICD-10-CM | POA: Diagnosis not present

## 2021-03-03 DIAGNOSIS — R918 Other nonspecific abnormal finding of lung field: Secondary | ICD-10-CM | POA: Diagnosis not present

## 2021-03-03 LAB — COMPREHENSIVE METABOLIC PANEL
ALT: 18 U/L (ref 0–44)
AST: 21 U/L (ref 15–41)
Albumin: 2.8 g/dL — ABNORMAL LOW (ref 3.5–5.0)
Alkaline Phosphatase: 76 U/L (ref 38–126)
Anion gap: 7 (ref 5–15)
BUN: 19 mg/dL (ref 8–23)
CO2: 27 mmol/L (ref 22–32)
Calcium: 9.4 mg/dL (ref 8.9–10.3)
Chloride: 106 mmol/L (ref 98–111)
Creatinine, Ser: 0.53 mg/dL (ref 0.44–1.00)
GFR, Estimated: >60 mL/min (ref >60)
Glucose, Bld: 98 mg/dL (ref 70–99)
Potassium: 3.9 mmol/L (ref 3.5–5.1)
Sodium: 140 mmol/L (ref 135–145)
Total Bilirubin: 0.3 mg/dL (ref 0.3–1.2)
Total Protein: 6.6 g/dL (ref 6.5–8.1)

## 2021-03-03 LAB — CBC WITH DIFFERENTIAL/PLATELET
Abs Immature Granulocytes: 0 10*3/uL (ref 0.00–0.07)
Basophils Absolute: 0 10*3/uL (ref 0.0–0.1)
Basophils Relative: 0 %
Eosinophils Absolute: 0.1 10*3/uL (ref 0.0–0.5)
Eosinophils Relative: 1 %
HCT: 40 % (ref 36.0–46.0)
Hemoglobin: 12.4 g/dL (ref 12.0–15.0)
Lymphocytes Relative: 46 %
Lymphs Abs: 6.1 10*3/uL — ABNORMAL HIGH (ref 0.7–4.0)
MCH: 30.8 pg (ref 26.0–34.0)
MCHC: 31 g/dL (ref 30.0–36.0)
MCV: 99.3 fL (ref 80.0–100.0)
Monocytes Absolute: 0.7 10*3/uL (ref 0.1–1.0)
Monocytes Relative: 5 %
Neutro Abs: 6.3 10*3/uL (ref 1.7–7.7)
Neutrophils Relative %: 48 %
Platelets: 204 10*3/uL (ref 150–400)
RBC: 4.03 MIL/uL (ref 3.87–5.11)
RDW: 15.5 % (ref 11.5–15.5)
WBC: 13.2 10*3/uL — ABNORMAL HIGH (ref 4.0–10.5)
nRBC: 0 % (ref 0.0–0.2)
nRBC: 0 /100 WBC

## 2021-03-03 LAB — LACTIC ACID, PLASMA: Lactic Acid, Venous: 1.5 mmol/L (ref 0.5–1.9)

## 2021-03-03 NOTE — Discharge Instructions (Addendum)
Patient was seen today with concerns for confusion and lack of IV access.  Her work-up here in the ER is reassuring.  Her chest x-ray does not show an acute pneumonia.  It shows chronic changes.  She has no complaints and is awake, alert, and oriented.  Her IV was left in place for medications as needed and prescribed by her primary doctor per her request.

## 2021-03-03 NOTE — ED Notes (Signed)
[facility]AR CALLED  °

## 2021-03-03 NOTE — ED Notes (Signed)
Discharge instructions discussed with pt. Pt verbalized understanding. Pt stable and ambulatory. No signature pad available. 

## 2021-03-03 NOTE — ED Triage Notes (Signed)
Pt received a chest xray and the results were read at 2000 yesterday and showed PNA. Doctor ordered IV antibx, but they were unable to obtain an IV so sent pt here for "PICC line placement". EMS able to get IV, but they felt like pt was "declining", but unable to tell EMS how.

## 2021-03-03 NOTE — ED Provider Notes (Signed)
[facility] EMERGENCY DEPARTMENT Provider Note   CSN: [id number] Arrival date & time: 03/03/21  0122     History Chief Complaint  Patient presents with   Vascular Access Problem    [name] [name] is a 87 y.o. female.  HPI     This is an 87 year old female with a history of heart failure, DVT, hyperlipidemia, dysphagia with PEG tube placement who presents needing IV access.  Per EMS, they were called out to [facility] as the patient needed IV access.  Per EMS, she had a chest x-ray that showed pneumonia.  Doctor ordered IV antibiotics but they were unable to get an IV.  They wanted to send her to the hospital for PICC line placement.  EMS did establish an IV; however, facility insisted transfer because "she was declining."  On my evaluation, patient has no new complaints.  She reports some persistent discomfort around her G-tube site which is not new.  She reports that she is supposed to have this out tomorrow.  She is currently taking food by mouth.  She denies chest pain, shortness of breath, cough, fevers.  Past Medical History:  Diagnosis Date   Allergic rhinitis    Allergy    CAD (coronary artery disease)    Mild CAD by cath 2008   CHF (congestive heart failure) (HCC)    DJD (degenerative joint disease)    rheumatoid   DVT (deep venous thrombosis) (HCC)    Dyslipidemia    History of echocardiogram    Echo 12/2018: EF 60-65, mod asymmetric LVH, Gr 1 DD   History of nuclear stress test    Myoview 5/17:  EF 74%, normal perfusion, low risk // Myoview 12/2018:  EF 89, very mild ischemia in inferoapical wall; Low Risk   History of pulmonary embolism    Hyperlipidemia    Insomnia    Osteoporosis    Psoriasis    Pulmonary fibrosis ([place])    Rheumatoid arthritis ([place])     Patient Active Problem List   Diagnosis Date Noted   Debility 11/04/2020   Dysphagia    Oral thrush    Encounter for dental examination    Facial swelling    Atrophy of edentulous  alveolar ridge    Abdominal wall cellulitis 10/24/2020   Severe sepsis ([place]) 10/23/2020   Abdominal pain 10/20/2020   Diarrhea 10/02/2020   Pressure injury of skin 09/06/2020   Acute on chronic respiratory failure with hypoxemia ([place]) 08/23/2020   Protein-calorie malnutrition, severe 08/20/2020   Fall 01/25/2020   Other fatigue 01/03/2020   Left shoulder pain 01/03/2020   Urinary frequency 01/03/2020   Acute otalgia, left 09/06/2019   Unintentional weight loss 07/18/2019   Personal history of PE (pulmonary embolism) 04/23/2019   Headache 02/11/2019   Iron deficiency anemia 12/21/2018   Cold sore 10/23/2018   Blood in stool 09/14/2018   Guttate psoriasis 07/19/2018   Therapeutic drug monitoring 07/17/2018   Chronic diastolic CHF (congestive heart failure) ([place]) 12/19/2017   Rash 08/11/2017   Chronic respiratory failure with hypoxia ([place]) 03/30/2017   Angular cheilitis 03/08/2017   Leg pain 10/25/2016   Back pain 08/05/2016   RA (rheumatoid arthritis) ([place]) 03/31/2015   Allergic rhinitis    Varicose vein 09/11/2014   ILD (interstitial lung disease) ([place]) 06/25/2012   Chest pain 02/27/2012   CAD (coronary artery disease) 02/27/2012   Pruritus 08/18/2009   Postinflammatory pulmonary fibrosis / RA ILD  06/30/2008   Constipation 01/03/2008  Dyslipidemia 06/16/2007    Past Surgical History:  Procedure Laterality Date   ABDOMINAL HYSTERECTOMY  1974   APPENDECTOMY  1959   CATARACT EXTRACTION     IR [place] TUBE CHANGE  11/05/2020   IR [place] GASTRO/COLONIC TUBE PERCUT W/FLUORO  10/29/2020   IR [place] GASTRO/COLONIC TUBE PERCUT W/FLUORO  11/17/2020   IR [place] GASTRO/COLONIC TUBE PERCUT W/FLUORO  01/15/2021   LAPAROTOMY N/A 08/23/2020   Procedure: EXPLORATORY LAPAROTOMY small bowel resection gastrostomy tube placement;  Surgeon: Dwan Bolt, MD;  Location: [facility] ORS;  Service: General;  Laterality: N/A;   LEFT HEART CATHETERIZATION WITH CORONARY ANGIOGRAM N/A 06/18/2013   Procedure: LEFT  HEART CATHETERIZATION WITH CORONARY ANGIOGRAM;  Surgeon: Peter M Martinique, MD;  Location: [facility] CATH LAB;  Service: Cardiovascular;  Laterality: N/A;   TONSILLECTOMY  1941, [place], 2007   Bilateral     OB History     Gravida  4   Para  2   Term  2   Preterm      AB      Living  0      SAB      IAB      Ectopic      Multiple      Live Births              Family History  Problem Relation Age of Onset   Kidney disease Daughter 12   Cancer Daughter    Heart attack Father 50   Alzheimer's disease Sister    Heart disease Sister    Alzheimer's disease Sister     Social History   Tobacco Use   Smoking status: Former    Packs/day: 0.50    Years: 20.00    Pack years: 10.00    Types: Cigarettes    Quit date: 07/11/1973    Years since quitting: 47.6   Smokeless tobacco: Never  Vaping Use   Vaping Use: Never used  Substance Use Topics   Alcohol use: No   Drug use: No    Home Medications Prior to Admission medications   Medication Sig Start Date End Date Taking? Authorizing Provider  acetaminophen (TYLENOL) 500 MG tablet Take 1,000 mg by mouth in the morning and at bedtime.   Yes [provider]  Amino Acids-Protein Hydrolys (PRO-STAT AWC) LIQD Take 30 mLs by mouth daily.   Yes [provider]  atorvastatin (LIPITOR) 20 MG tablet Take 1 tablet (20 mg total) by mouth daily. 01/08/21 07/07/21 Yes Loel Dubonnet, NP  Dextromethorphan-guaiFENesin (ROBITUSSIN DM PO) Take 10 mLs by mouth every 6 (six) hours as needed (cough/congestion).   Yes [provider]  Docusate Sodium 100 MG capsule Take 100 mg by mouth daily.   Yes [provider]  famotidine (PEPCID) 20 MG tablet Take 20 mg by mouth daily.   Yes [provider]  gabapentin (NEURONTIN) 300 MG capsule Take 1 capsule (300 mg total) by mouth 3 (three) times daily. 11/23/20  Yes Angiulli, Lavon Paganini, PA-C  lipase/protease/amylase (CREON) 36000  UNITS CPEP capsule Take 1 capsule (36,000 Units total) by mouth 3 (three) times daily before meals. 10/09/20  Yes Hoyt Koch, MD  midodrine (PROAMATINE) 5 MG tablet Take 1 tablet (5 mg total) by mouth 3 (three) times daily with meals. 11/23/20  Yes Angiulli, Lavon Paganini, PA-C  mirtazapine (REMERON) 30 MG tablet Take 15 mg by mouth at bedtime.   Yes [provider]  Multiple Vitamin (MULTIVITAMIN WITH MINERALS) TABS tablet Take 1 tablet by mouth daily. 11/05/20  Yes Mercy Riding, MD  nitroGLYCERIN (NITROSTAT) 0.3 MG SL tablet Place 0.3 mg under the tongue every 5 (five) minutes as needed for chest pain.   Yes [provider]  Nutritional Supplements (ENSURE ORIGINAL PO) Take 237 mLs by mouth 4 (four) times daily.   Yes [provider]  predniSONE (DELTASONE) 5 MG tablet TAKE 1 TABLET DAILY WITH BREAKFAST Patient taking differently: Take 5 mg by mouth daily with breakfast. 05/08/20  Yes Mannam, Praveen, MD  acetaminophen (TYLENOL) 325 MG tablet Take 2 tablets (650 mg total) by mouth every 6 (six) hours as needed for mild pain, fever or headache (or Fever >/= 101). Patient not taking: No sig reported 11/04/20   Mercy Riding, MD  benzocaine (ORAJEL) 10 % mucosal gel Use as directed in the mouth or throat as needed for mouth pain. Patient not taking: No sig reported 11/23/20   Angiulli, Lavon Paganini, PA-C  chlorhexidine (PERIDEX) 0.12 % solution Use as directed 15 mLs in the mouth or throat 4 (four) times daily. Patient not taking: Reported on 03/03/2021 11/23/20   Angiulli, Lavon Paganini, PA-C  famotidine (PEPCID) 40 MG/5ML suspension Take 2.5 mLs (20 mg total) by mouth daily. Patient not taking: Reported on 03/03/2021 11/23/20   Angiulli, Lavon Paganini, PA-C  fluticasone [place]) 50 MCG/ACT nasal spray Place 2 sprays into both nostrils daily as needed for allergies or rhinitis. Patient not taking: Reported on 03/03/2021 03/17/15   Hoyt Koch, MD  mirtazapine (REMERON SOL-TAB) 30  MG disintegrating tablet Take 1 tablet (30 mg total) by mouth at bedtime. Patient not taking: Reported on 03/03/2021 11/23/20   Angiulli, Lavon Paganini, PA-C  mycophenolate (CELLCEPT) 500 MG tablet TAKE 2 TABLETS BY MOUTH TWICE DAILY Patient not taking: No sig reported 01/18/21   Mannam, Hart Robinsons, MD  nitroGLYCERIN (NITROSTAT) 0.4 MG SL tablet Place 1 tablet (0.4 mg total) under the tongue every 5 (five) minutes as needed for chest pain. Patient not taking: No sig reported 06/29/20 03/03/21  Burnell Blanks, MD  Nutritional Supplements (FEEDING SUPPLEMENT, OSMOLITE 1.2 CAL,) LIQD Place 50 mL/hr into feeding tube continuous. Patient not taking: Reported on 03/03/2021 11/04/20   Mercy Riding, MD  oxyCODONE (OXY IR/ROXICODONE) 5 MG immediate release tablet Take 1 tablet (5 mg total) by mouth every 4 (four) hours as needed for severe pain. Patient not taking: No sig reported 11/23/20   Angiulli, Lavon Paganini, PA-C  polyethylene glycol (MIRALAX / GLYCOLAX) 17 g packet Take 17 g by mouth daily. Patient not taking: No sig reported 11/23/20   Angiulli, Lavon Paganini, PA-C    Allergies    Crestor [rosuvastatin], Lactose intolerance (gi), Penicillins, and Imuran [azathioprine]  Review of Systems   Review of Systems  Constitutional:  Negative for fever.  Respiratory:  Negative for shortness of breath.   Cardiovascular:  Negative for chest pain.  Gastrointestinal:  Positive for abdominal pain. Negative for nausea and vomiting.       At PEG tube site  Genitourinary:  Negative for dysuria.  All other systems reviewed and are negative.  Physical Exam Updated Vital Signs BP 104/76   Pulse 93   Temp 98.4 F (36.9 C) (Oral)   Resp (!) 25   Ht 1.626 m ('5\' 4"'$ )   Wt 45.2 kg   SpO2 99%   BMI 17.10 kg/m   Physical Exam Vitals and nursing note reviewed.  Constitutional:  Appearance: She is well-developed. She is not ill-appearing.     Comments: Elderly, chronically ill-appearing but nontoxic  HENT:      Head: Normocephalic and atraumatic.     Mouth/Throat:     Mouth: Mucous membranes are moist.  Eyes:     Pupils: Pupils are equal, round, and reactive to light.  Cardiovascular:     Rate and Rhythm: Normal rate and regular rhythm.     Heart sounds: Normal heart sounds.  Pulmonary:     Effort: Pulmonary effort is normal. No respiratory distress.     Breath sounds: No wheezing.  Abdominal:     General: Bowel sounds are normal.     Palpations: Abdomen is soft.     Tenderness: There is abdominal tenderness.     Comments: At PEG tube site, no adjacent exudate or erythema  Musculoskeletal:     Cervical back: Neck supple.     Right lower leg: No edema.     Left lower leg: No edema.  Skin:    General: Skin is warm and dry.  Neurological:     Mental Status: She is alert and oriented to person, place, and time.  Psychiatric:        Mood and Affect: Mood normal.    ED Results / Procedures / Treatments   Labs (all labs ordered are listed, but only abnormal results are displayed) Labs Reviewed  CBC WITH DIFFERENTIAL/PLATELET - Abnormal; Notable for the following components:      Result Value   WBC 13.2 (*)    Lymphs Abs 6.1 (*)    All other components within normal limits  COMPREHENSIVE METABOLIC PANEL - Abnormal; Notable for the following components:   Albumin 2.8 (*)    All other components within normal limits  LACTIC ACID, PLASMA  URINALYSIS, ROUTINE W REFLEX MICROSCOPIC    EKG EKG Interpretation  Date/Time:  Wednesday March 03 2021 01:24:07 EDT Ventricular Rate:  91 PR Interval:  171 QRS Duration: 77 QT Interval:  345 QTC Calculation: 425 R Axis:   34 Text Interpretation: Sinus rhythm Probable left atrial enlargement Low voltage, precordial leads Minimal ST elevation, inferior leads no significant change from prior Confirmed by Thayer Jew [phone]) on 03/03/2021 2:17:04 AM  Radiology DG Abdomen Acute W/Chest  Result Date: 03/03/2021 CLINICAL DATA:  Feeding tube  placement EXAM: DG ABDOMEN ACUTE WITH 1 VIEW CHEST COMPARISON:  11/29/2020 FINDINGS: A catheter overlies the left mid abdomen compatible with the given history of gastrostomy catheter. Tip position is unchanged from prior examination overlying the expected gastric lumen. Normal abdominal gas pattern. No free intraperitoneal gas. Coarse bibasilar pulmonary infiltrates are present, chronic in etiology. IMPRESSION: Gastrostomy catheter overlies the expected position within the left mid abdomen. Electronically Signed   By: Fidela Salisbury M.D.   On: 03/03/2021 02:19    Procedures Procedures   Medications Ordered in ED Medications - No data to display  ED Course  I have reviewed the triage vital signs and the nursing notes.  Pertinent labs & imaging results that were available during my care of the patient were reviewed by me and considered in my medical decision making (see chart for details).  Clinical Course as of 03/03/21 0330  Wed Mar 03, 2021  0142 Attempted to contact [facility] x2 without success [CH]  0330 Spoke to the nurse at [facility].  She is requesting IV access.  Additionally she states that she felt that the patient was more confused.  When I question  whether the patient could take oral antibiotics, the nurse was unclear although they have not used her PEG tube in over a week. [CH]    Clinical Course User Index [CH] Conchita Truxillo, Barbette Hair, MD   MDM Rules/Calculators/A&P                            Patient presents with reported confusion and concern for lack of IV access.  It is unclear to me why she needed to be transferred.  She is awake, alert, oriented.  She has no acute complaints.  She does have some ongoing discomfort at PEG tube site although that is to be removed on Thursday.  She is nontoxic-appearing and afebrile.  I have reviewed her labs today.  She has a slight leukocytosis.  Otherwise labs are reassuring.  Her repeat acute abdominal series shows chronic lung  changes.  No acute pneumonia noted.  Per her request, we will send her back with an IV in place although do not know if she truly needs IV antibiotics.  I will defer this to her primary physician.  After history, exam, and medical workup I feel the patient has been appropriately medically screened and is safe for discharge home. Pertinent diagnoses were discussed with the patient. Patient was given return precautions.   Final Clinical Impression(s) / ED Diagnoses Final diagnoses:  Confusion    Rx / DC Orders ED Discharge Orders     None        Sanjuana Mruk, Barbette Hair, MD 03/03/21 [phone]

## 2021-03-03 NOTE — ED Notes (Signed)
Introduced myself to pt. Pt oriented to self only, but is a very good historian and can tell you about her medical hx. Pt has some abdominal pain, but it's from her PEG tube and it's no different. No new c/o tonight. Unsure why her facility sent her. Given another warm blanket. Denies any needs.  Xray at bedside.

## 2021-03-03 NOTE — ED Notes (Signed)
Pt appears to be asleep. NAD noted. 

## 2021-03-04 DIAGNOSIS — K9423 Gastrostomy malfunction: Secondary | ICD-10-CM | POA: Diagnosis not present

## 2021-03-04 DIAGNOSIS — J189 Pneumonia, unspecified organism: Secondary | ICD-10-CM | POA: Diagnosis not present

## 2021-03-04 DIAGNOSIS — Z09 Encounter for follow-up examination after completed treatment for conditions other than malignant neoplasm: Secondary | ICD-10-CM | POA: Diagnosis not present

## 2021-03-04 DIAGNOSIS — E640 Sequelae of protein-calorie malnutrition: Secondary | ICD-10-CM | POA: Diagnosis not present

## 2021-03-04 DIAGNOSIS — R1312 Dysphagia, oropharyngeal phase: Secondary | ICD-10-CM | POA: Diagnosis not present

## 2021-03-08 ENCOUNTER — Ambulatory Visit (INDEPENDENT_AMBULATORY_CARE_PROVIDER_SITE_OTHER): Payer: Medicare Other | Admitting: Pulmonary Disease

## 2021-03-08 ENCOUNTER — Other Ambulatory Visit: Payer: Self-pay

## 2021-03-08 VITALS — BP 114/66 | HR 102 | Ht 66.0 in | Wt 102.0 lb

## 2021-03-08 DIAGNOSIS — J849 Interstitial pulmonary disease, unspecified: Secondary | ICD-10-CM | POA: Diagnosis not present

## 2021-03-08 NOTE — Progress Notes (Signed)
[name] [name]    [id number]    May 06, 1931  Primary Care Physician:Crawford, Real Cons, MD  Referring Physician: Hoyt Koch, MD [address] [place],  [place] [address]  Chief complaint: Follow-up for RA ILD  HPI: 87 year old with RA ILD, UIP fibrosis, chronic hypoxic respiratory failure on oxygen, DVT, PE Former patient of Dr. Lake Bells.  Pulmonary fibrosis diagnosed in 2003.    She was [facility] and [facility].  Being managed by Dr. Trudie Reed for rheumatoid arthritis She was previously on infliximab, leflunomide and Imuran, which she did not tolerate due to severe GI symptoms.  Changed to CellCept in later part of 2019 Also has history of recurrent PE, DVT.  She has been difficult antiplatelet due to frequent falls.  Anticoagulation was stopped in January 2018 and she gets Lovenox to use when she does long distance travel. Started Ofev around March 2021 Hospitalized in July 2021 for dizziness, fall.  It is unclear to secondary to the Ofev however the medication was held  Received note from Dr. Trudie Reed, rheumatology dated 04/18/2020 Patient continues on Remicade, mycophenolate and low-dose prednisone with good control of symptoms  She had small bowel obstruction in February 2022 requiring exploratory laparotomy With repeat hospitalizations in March and April 2022 for wound infections.  She had a prolonged stay at rehab and then sent to skilled nursing facility  Smoking history: Former smoker.  Quit in 1975.  10-pack-year smoking history  Interim history: Developed COVID in June 2020 which is treated with paxlovid PEG tube has been removed.  She is eating better and has gained some weight Continues on supplemental oxygen Follows with palliative care as an outpatient  Outpatient Encounter Medications as of 03/08/2021  Medication Sig   acetaminophen (TYLENOL) 325 MG tablet Take 2 tablets (650 mg total) by mouth every 6 (six)  hours as needed for mild pain, fever or headache (or Fever >/= 101).   acetaminophen (TYLENOL) 500 MG tablet Take 1,000 mg by mouth in the morning and at bedtime.   Amino Acids-Protein Hydrolys (PRO-STAT AWC) LIQD Take 30 mLs by mouth daily.   atorvastatin (LIPITOR) 20 MG tablet Take 1 tablet (20 mg total) by mouth daily.   benzocaine (ORAJEL) 10 % mucosal gel Use as directed in the mouth or throat as needed for mouth pain.   chlorhexidine (PERIDEX) 0.12 % solution Use as directed 15 mLs in the mouth or throat 4 (four) times daily.   Dextromethorphan-guaiFENesin (ROBITUSSIN DM PO) Take 10 mLs by mouth every 6 (six) hours as needed (cough/congestion).   Docusate Sodium 100 MG capsule Take 100 mg by mouth daily.   famotidine (PEPCID) 20 MG tablet Take 20 mg by mouth daily.   famotidine (PEPCID) 40 MG/5ML suspension Take 2.5 mLs (20 mg total) by mouth daily.   fluticasone (FLONASE) 50 MCG/ACT nasal spray Place 2 sprays into both nostrils daily as needed for allergies or rhinitis.   gabapentin (NEURONTIN) 300 MG capsule Take 1 capsule (300 mg total) by mouth 3 (three) times daily.   lipase/protease/amylase (CREON) 36000 UNITS CPEP capsule Take 1 capsule (36,000 Units total) by mouth 3 (three) times daily before meals.   midodrine (PROAMATINE) 5 MG tablet Take 1 tablet (5 mg total) by mouth 3 (three) times daily with meals.   mirtazapine (REMERON SOL-TAB) 30 MG disintegrating tablet Take 1 tablet (30 mg total) by mouth at bedtime.   mirtazapine (REMERON) 30 MG tablet Take  15 mg by mouth at bedtime.   Multiple Vitamin (MULTIVITAMIN WITH MINERALS) TABS tablet Take 1 tablet by mouth daily.   mycophenolate (CELLCEPT) 500 MG tablet TAKE 2 TABLETS BY MOUTH TWICE DAILY   nitroGLYCERIN (NITROSTAT) 0.3 MG SL tablet Place 0.3 mg under the tongue every 5 (five) minutes as needed for chest pain.   Nutritional Supplements (ENSURE ORIGINAL PO) Take 237 mLs by mouth 4 (four) times daily.   Nutritional Supplements  (FEEDING SUPPLEMENT, OSMOLITE 1.2 CAL,) LIQD Place 50 mL/hr into feeding tube continuous.   oxyCODONE (OXY IR/ROXICODONE) 5 MG immediate release tablet Take 1 tablet (5 mg total) by mouth every 4 (four) hours as needed for severe pain.   polyethylene glycol (MIRALAX / GLYCOLAX) 17 g packet Take 17 g by mouth daily.   predniSONE (DELTASONE) 5 MG tablet TAKE 1 TABLET DAILY WITH BREAKFAST (Patient taking differently: Take 5 mg by mouth daily with breakfast.)   nitroGLYCERIN (NITROSTAT) 0.4 MG SL tablet Place 1 tablet (0.4 mg total) under the tongue every 5 (five) minutes as needed for chest pain. (Patient not taking: No sig reported)   No facility-administered encounter medications on file as of 03/08/2021.   Physical Exam: Blood pressure 114/66, pulse (!) 102, height '5\' 6"'$  (1.676 m), weight 102 lb (46.3 kg), SpO2 100 %. Gen:      No acute distress frail, elderly HEENT:  EOMI, sclera anicteric Neck:     No masses; no thyromegaly Lungs:    Bibasal crackles CV:         Regular rate and rhythm; no murmurs Abd:      + bowel sounds; soft, non-tender; no palpable masses, no distension Ext:    No edema; adequate peripheral perfusion Skin:      Warm and dry; no rash Neuro: alert and oriented x 3 Psych: normal mood and affect   Data Reviewed: Imaging: High-res CT 10/26/2017-UIP pattern pulmonary fibrosis.  Stable compared to 2017.   High-res CT 05/30/2019-UIP pattern fibrosis.  Stable.  CTA 01/24/2020-no PE, stable changes of emphysema and UIP pulmonary fibrosis  CT abdomen pelvis 10/24/2020-visualized lung bases show a stable pattern of UIP pulmonary fibrosis I have reviewed the images personally.  PFTs: 10/13/2017 FVC 1.67 [86%], FEV1 1.36 [89%], F/F 81, TLC 3.64 [70%], DLCO 6.17 [24%] Mild restriction and severe diffusion defect  07/25/2019 FVC 1.63 [85%], FEV1 1.32 [89%], F/F 81, TLC 3.74 [71%), DLCO 5.83 [30%] Mild restriction, severe diffusion  6 min walk July 2017 6 minute walk distance  324 m, O2 saturation nadir 93% on room air 09/06/2019 -61 m, nadir O2 sat of 82%  Cardiac: Echocardiogram 01/26/2020-LVEF 55-60%, normal RV size and function.  Normal PA systolic pressure.  Assessment:  RA ILD with UIP fibrosis Continues on mycophenolate, prednisone and Ofev.  Dickey Gave was started in early 2021  This year she has had a change in health with small bowel obstruction, exploratory laparotomy and multiple admissions for wound infection She has lost a lot of weight and appears frail, malnourished  At last visit in June 2022 we had discussed risk benefit of continuing anti fibrotics and CellCept as I am not sure this is helping and may be making her condition worse with infections, loss of weight and appetite.  As noted before her UIP fibrosis has been stable on CT scan dating back to 2014 but with clear progression compared to 2009 so benefit may be limited  After informed decision making with patient and nephew we had stopped these medications.  She is also off Remicade that she gets through Dr. Amil Amen  She is doing better now with some gain in weight and a they are interested in reassessing however no decisions have been made on restart of therapy  History of DVT, PE Not on chronic anticoagulation due to concern for fall  Health maintenance Has received booster vaccine   Plan/Recommendations: - Off CellCept and Ofev - High-res CT and follow-up in clinic  Marshell Garfinkel MD [facility] 03/08/2021, 10:01 AM  CC: Hoyt Koch, *

## 2021-03-08 NOTE — Patient Instructions (Signed)
We will order high-resolution CT for reevaluation of the scarring in the lung Follow-up in clinic in 2 to 3 months

## 2021-03-09 DIAGNOSIS — L89153 Pressure ulcer of sacral region, stage 3: Secondary | ICD-10-CM | POA: Diagnosis not present

## 2021-03-09 DIAGNOSIS — E860 Dehydration: Secondary | ICD-10-CM | POA: Diagnosis not present

## 2021-03-09 DIAGNOSIS — T8189XD Other complications of procedures, not elsewhere classified, subsequent encounter: Secondary | ICD-10-CM | POA: Diagnosis not present

## 2021-03-11 ENCOUNTER — Encounter: Payer: Self-pay | Admitting: Pulmonary Disease

## 2021-03-12 ENCOUNTER — Non-Acute Institutional Stay: Payer: Medicare Other | Admitting: Student

## 2021-03-12 ENCOUNTER — Other Ambulatory Visit: Payer: Self-pay

## 2021-03-12 DIAGNOSIS — Z515 Encounter for palliative care: Secondary | ICD-10-CM | POA: Diagnosis not present

## 2021-03-12 DIAGNOSIS — E43 Unspecified severe protein-calorie malnutrition: Secondary | ICD-10-CM

## 2021-03-12 DIAGNOSIS — T85528A Displacement of other gastrointestinal prosthetic devices, implants and grafts, initial encounter: Secondary | ICD-10-CM | POA: Diagnosis not present

## 2021-03-12 NOTE — Progress Notes (Signed)
[facility] Palliative Care Consult Note Telephone: [phone]  Fax: [phone]    Date of encounter: 03/12/21 12:51 PM PATIENT NAME: [name] [name] [address] [place] [address]   [phone] (home)  DOB: [birth year] MRN: [id number] PRIMARY CARE PROVIDER:    Dr. Shon Baton PROVIDER:   Dr. Shon Hough  RESPONSIBLE PARTY:    Contact Information     Name Relation Home Work [facility] [phone]  [place], [place] Relative [phone]  [phone]   [name] [phone]  [phone]        I met face to face with patient in the facility. Palliative Care was asked to follow this patient by consultation request of  Dr. Shon Hough to address advance care planning and complex medical decision making. This is a follow up visit.  Message left for patient's sister [name] [name] to discuss today's visit; awaiting return call.                                    ASSESSMENT AND PLAN / RECOMMENDATIONS:   Advance Care Planning/Goals of Care: Goals include to maximize quality of life and symptom management.   CODE STATUS: Full Code  Symptom Management/Plan:  Protein calorie malnutrition-patient's g-tube removed. She is currently eating and drinking nutritional supplements. Continue Ensures QID, Prostat daily. There is concern of how much she is actually absorbing due to contents present in ostomy bag covering g-tube site. Currently her weight is stable. Continue routine weights.   G-tube site-patient still has large amount of food contents coming from site; ostomy bag in place to collect contents, help maintain skin integrity. Will follow up with PCP as  facility PCP and NP have consulted with GI. Follow up with GI as scheduled.  Shortness of breath-secondary to her heart failure, pulmonary fibrosis. Continue oxygen at 2 lpm as directed.   Follow up Palliative Care Visit: Palliative care  will continue to follow for complex medical decision making, advance care planning, and clarification of goals. Return in 4 weeks or prn.  I spent 35 minutes providing this consultation. More than 50% of the time in this consultation was spent in counseling and care coordination.  PPS: 30%  HOSPICE ELIGIBILITY/DIAGNOSIS: TBD  Chief Complaint: Palliative medicine follow up visit.   HISTORY OF PRESENT ILLNESS:  [name] [name] is a 87 y.o. year old female  with protein calorie malnutrition, pulmonary fibrosis, CAD, diastolic CHF, iron deficiency anemia, polyneuropathy, RA.   Patient resides at [facility]. She was seen in ED on 03/03/21 due to confusion; ED visit on 02/24/21 due feeding tube dysfunction. Per nursing, patient 's tube was removed at some point and she did not want to have tube replaced. Patient states she "was tired of feeding tube." Nursing staff expresses concern as anything she eats or drinks is coming out of tube site. Staff has placed ostomy bag to catch stomach contents. Patient does endorse weakness, shortness of breath with exertion. She wears oxygen at 2 lpm. She denies pain. She currently has a stage 2 wound to sacrum that facility staff is dressing. Air mattress on bed. Patient was completed antibiotics (Levaquin) on 03/07/21 for pneumonia.  Patient is received resting in bed; she welcomes visit. Patient is very frail, thin in appearance. She has ostomy bag over G-tube site; contents appear to be the Ensure she just drank and food particles are present.  She has dressing to right lower abdomen mild erythema to abdomen. She has purple/green bruise to left forearm. A 10-point review of systems is negative, except for the pertinent positives and negatives detailed in the HPI.    History obtained from review of EMR, discussion with primary team, and interview with family, facility staff/caregiver and/or [name] [name].  I reviewed available labs, medications, imaging, studies and related  documents from the EMR.  Records reviewed and summarized above.    Physical Exam: Weight: 99.3 pounds Pulse 100, resp 20, b/p 110/56, sats 94% at 2 lpm Constitutional: NAD General: frail appearing, thin, chronically ill appearing EYES: anicteric sclera, lids intact, no discharge  ENMT: intact hearing, oral mucous membranes moist CV: S1S2, RRR, no LE edema Pulmonary: fine crackles to left base, otherwise clear, no increased work of breathing, no cough Abdomen: described above GU: deferred MSK: sarcopenia, non-ambulatory Skin: warm and dry, sacral wound not visualized Neuro: generalized weakness, A & O x 2 Psych: non-anxious affect, pleasant Hem/lymph/immuno: no widespread bruising   Thank you for the opportunity to participate in the care of [name] [name].  The palliative care team will continue to follow. Please call our office at [phone] if we can be of additional assistance.   [name] Slocumb, NP   COVID-19 PATIENT SCREENING TOOL Asked and negative response unless otherwise noted:   Have you had symptoms of covid, tested positive or been in contact with someone with symptoms/positive test in the past 5-10 days? No

## 2021-03-13 DIAGNOSIS — E860 Dehydration: Secondary | ICD-10-CM | POA: Diagnosis not present

## 2021-03-15 DIAGNOSIS — D649 Anemia, unspecified: Secondary | ICD-10-CM | POA: Diagnosis not present

## 2021-03-16 DIAGNOSIS — L89153 Pressure ulcer of sacral region, stage 3: Secondary | ICD-10-CM | POA: Diagnosis not present

## 2021-03-16 DIAGNOSIS — T8189XD Other complications of procedures, not elsewhere classified, subsequent encounter: Secondary | ICD-10-CM | POA: Diagnosis not present

## 2021-03-17 ENCOUNTER — Telehealth: Payer: Self-pay | Admitting: Student

## 2021-03-17 DIAGNOSIS — E870 Hyperosmolality and hypernatremia: Secondary | ICD-10-CM | POA: Diagnosis not present

## 2021-03-17 DIAGNOSIS — K9423 Gastrostomy malfunction: Secondary | ICD-10-CM | POA: Diagnosis not present

## 2021-03-17 NOTE — Telephone Encounter (Signed)
Palliative NP returned call to [name] [name] as first contact [name] [name] is out of town for two weeks. Discussed palliative medicine visit, goals of care for patient. Patient is awaiting GI visit due to g-tube site continuing to have large amounts of contents expel through site. NP left message for facility NP [name] [name]; awaiting return call.

## 2021-03-18 DIAGNOSIS — E119 Type 2 diabetes mellitus without complications: Secondary | ICD-10-CM | POA: Diagnosis not present

## 2021-03-22 ENCOUNTER — Emergency Department (HOSPITAL_COMMUNITY): Payer: Medicare Other

## 2021-03-22 ENCOUNTER — Other Ambulatory Visit: Payer: Self-pay

## 2021-03-22 ENCOUNTER — Emergency Department (HOSPITAL_COMMUNITY)
Admission: EM | Admit: 2021-03-22 | Discharge: 2021-03-22 | Disposition: A | Discharge status: Home / Self Care | Payer: Medicare Other | Attending: Emergency Medicine | Admitting: Emergency Medicine

## 2021-03-22 DIAGNOSIS — R519 Headache, unspecified: Secondary | ICD-10-CM | POA: Diagnosis not present

## 2021-03-22 DIAGNOSIS — W19XXXA Unspecified fall, initial encounter: Secondary | ICD-10-CM

## 2021-03-22 DIAGNOSIS — M25522 Pain in left elbow: Secondary | ICD-10-CM | POA: Diagnosis not present

## 2021-03-22 DIAGNOSIS — I5032 Chronic diastolic (congestive) heart failure: Secondary | ICD-10-CM | POA: Insufficient documentation

## 2021-03-22 DIAGNOSIS — Z87891 Personal history of nicotine dependence: Secondary | ICD-10-CM | POA: Insufficient documentation

## 2021-03-22 DIAGNOSIS — Z043 Encounter for examination and observation following other accident: Secondary | ICD-10-CM | POA: Diagnosis not present

## 2021-03-22 DIAGNOSIS — I2510 Atherosclerotic heart disease of native coronary artery without angina pectoris: Secondary | ICD-10-CM | POA: Insufficient documentation

## 2021-03-22 DIAGNOSIS — Z743 Need for continuous supervision: Secondary | ICD-10-CM | POA: Diagnosis not present

## 2021-03-22 DIAGNOSIS — J849 Interstitial pulmonary disease, unspecified: Secondary | ICD-10-CM | POA: Diagnosis not present

## 2021-03-22 DIAGNOSIS — S5002XA Contusion of left elbow, initial encounter: Secondary | ICD-10-CM | POA: Diagnosis not present

## 2021-03-22 DIAGNOSIS — M542 Cervicalgia: Secondary | ICD-10-CM | POA: Diagnosis not present

## 2021-03-22 DIAGNOSIS — S0083XA Contusion of other part of head, initial encounter: Secondary | ICD-10-CM | POA: Insufficient documentation

## 2021-03-22 DIAGNOSIS — W050XXA Fall from non-moving wheelchair, initial encounter: Secondary | ICD-10-CM | POA: Insufficient documentation

## 2021-03-22 DIAGNOSIS — Z96653 Presence of artificial knee joint, bilateral: Secondary | ICD-10-CM | POA: Insufficient documentation

## 2021-03-22 DIAGNOSIS — R102 Pelvic and perineal pain: Secondary | ICD-10-CM | POA: Diagnosis not present

## 2021-03-22 DIAGNOSIS — S0990XA Unspecified injury of head, initial encounter: Secondary | ICD-10-CM | POA: Diagnosis not present

## 2021-03-22 DIAGNOSIS — R404 Transient alteration of awareness: Secondary | ICD-10-CM | POA: Diagnosis not present

## 2021-03-22 DIAGNOSIS — R1084 Generalized abdominal pain: Secondary | ICD-10-CM | POA: Diagnosis not present

## 2021-03-22 NOTE — ED Notes (Addendum)
This RN came out of room 6, heard pt yelling. This RN opened door to pt's room. Pt found laying on the ground. This RN called for assistance and the ED dr. Pt denies complaints/pain. Pt examined by Dr. Almyra Free prior to being moved off the floor. Pt moved back to stretcher by Dr. Almyra Free and This RN after vital signs and physician exam. Orders placed by Dr. Almyra Free for imaging. Pt states she was getting out of bed to go home. Pt denies falling to the ground.   No Nurse tech due to short staffing. Prior to fall pt had fall risk band and nonslip socks on, call be within reach.  Post fall: yellow fall risk sign placed on door, bed alarm switched on.

## 2021-03-22 NOTE — ED Notes (Signed)
Norvel Richards ( [facility]) asked if the RN could please contact the family for an update.

## 2021-03-22 NOTE — ED Notes (Signed)
[facility] called and given update on patient's status.  Patient currently waiting for PTAR

## 2021-03-22 NOTE — ED Notes (Signed)
PTAR arrived to department to transport patient back to [facility].  RN to bedside to change patient prior to transport.  Patients old adult brief removed.  Patient found with pressure injuries as documented in chart on previous encounter.  Dressings soiled.  Patient cleaned and new dressings applied.  Patient has redness and skin irritation noted to front of abdomen.  Wound as previously chart to abdomen had soiled dressing.  Wound cleaned and new wet to dry dressing applied.  Patient currently has ostomy bag over G-tube site.  Bag misplaced and not currently covering site.  Bag removed and patient cleaned.  New ostomy bag placed.

## 2021-03-22 NOTE — ED Triage Notes (Signed)
Pt presents to ED via ems cc fall. Per ems, pt was reaching for something while in her wheelchair and fell out of it. No LOC, blood thinners per EMS. Pt noted to have hematoma to left side of her forehead, no active bleeding.

## 2021-03-22 NOTE — Discharge Instructions (Addendum)
Return for any problem.  ?

## 2021-03-22 NOTE — ED Provider Notes (Signed)
Patient signed out to me is pending transportation back to her facility.  She apparently is wheelchair-bound.  Nursing called me over stating that she try to get up by herself and fell to the ground again found on the ground.  She denies any pain.  Ranging her neck well without discomfort.  No C or T or L-spine tenderness step-offs noted.  Repeat CT imaging performed no acute findings patient remains pain-free.  Will be discharged back to facility, advised her to move only with her wheelchair or other assistance.   Luna Fuse, MD 03/22/21 1705

## 2021-03-22 NOTE — ED Notes (Signed)
[name] [name]  [id number] would like to have a call regarding the patients return to [facility]

## 2021-03-22 NOTE — ED Provider Notes (Signed)
[facility] DEPT Provider Note   CSN: [id number] Arrival date & time: 03/22/21  1100     History Chief Complaint  Patient presents with   [name] [name]    [name] [name] is a 87 y.o. female.  87 year old female with prior medical history as detailed below presents for evaluation.  Patient fell from her wheelchair earlier today.  She did strike her head.  She did not pass out.  She does have a contusion to her forehead.  She also has a contusion noted to the left lateral elbow.  She denies headache.  She denies neck pain.  She denies other complaint.  The history is provided by the patient.  Fall This is a new problem. The current episode started 3 to 5 hours ago. The problem occurs rarely. The problem has not changed since onset.Pertinent negatives include no chest pain and no abdominal pain. Nothing aggravates the symptoms. Nothing relieves the symptoms.      Past Medical History:  Diagnosis Date   Allergic rhinitis    Allergy    CAD (coronary artery disease)    Mild CAD by cath 2008   CHF (congestive heart failure) (HCC)    DJD (degenerative joint disease)    rheumatoid   DVT (deep venous thrombosis) (HCC)    Dyslipidemia    History of echocardiogram    Echo 12/2018: EF 60-65, mod asymmetric LVH, Gr 1 DD   History of nuclear stress test    Myoview 5/17:  EF 74%, normal perfusion, low risk // Myoview 12/2018:  EF 89, very mild ischemia in inferoapical wall; Low Risk   History of pulmonary embolism    Hyperlipidemia    Insomnia    Osteoporosis    Psoriasis    Pulmonary fibrosis ([place])    Rheumatoid arthritis ([place])     Patient Active Problem List   Diagnosis Date Noted   Debility 11/04/2020   Dysphagia    Oral thrush    Encounter for dental examination    Facial swelling    Atrophy of edentulous alveolar ridge    Abdominal wall cellulitis 10/24/2020   Severe sepsis ([place]) 10/23/2020   Abdominal pain 10/20/2020   Diarrhea 10/02/2020   Pressure  injury of skin 09/06/2020   Acute on chronic respiratory failure with hypoxemia ([place]) 08/23/2020   Protein-calorie malnutrition, severe 08/20/2020   Fall 01/25/2020   Other fatigue 01/03/2020   Left shoulder pain 01/03/2020   Urinary frequency 01/03/2020   Acute otalgia, left 09/06/2019   Unintentional weight loss 07/18/2019   Personal history of PE (pulmonary embolism) 04/23/2019   Headache 02/11/2019   Iron deficiency anemia 12/21/2018   Cold sore 10/23/2018   Blood in stool 09/14/2018   Guttate psoriasis 07/19/2018   Therapeutic drug monitoring 07/17/2018   Chronic diastolic CHF (congestive heart failure) ([place]) 12/19/2017   Rash 08/11/2017   Chronic respiratory failure with hypoxia ([place]) 03/30/2017   Angular cheilitis 03/08/2017   Leg pain 10/25/2016   Back pain 08/05/2016   RA (rheumatoid arthritis) ([place]) 03/31/2015   Allergic rhinitis    Varicose vein 09/11/2014   ILD (interstitial lung disease) ([place]) 06/25/2012   Chest pain 02/27/2012   CAD (coronary artery disease) 02/27/2012   Pruritus 08/18/2009   Postinflammatory pulmonary fibrosis / RA ILD  06/30/2008   Constipation 01/03/2008   Dyslipidemia 06/16/2007    Past Surgical History:  Procedure Laterality Date   [place]   CATARACT EXTRACTION  IR GJ TUBE CHANGE  11/05/2020   IR REPLC GASTRO/COLONIC TUBE PERCUT W/FLUORO  10/29/2020   IR REPLC GASTRO/COLONIC TUBE PERCUT W/FLUORO  11/17/2020   IR REPLC GASTRO/COLONIC TUBE PERCUT W/FLUORO  01/15/2021   LAPAROTOMY N/A 08/23/2020   Procedure: EXPLORATORY LAPAROTOMY small bowel resection gastrostomy tube placement;  Surgeon: Dwan Bolt, MD;  Location: [facility] ORS;  Service: General;  Laterality: N/A;   LEFT HEART CATHETERIZATION WITH CORONARY ANGIOGRAM N/A 06/18/2013   Procedure: LEFT HEART CATHETERIZATION WITH CORONARY ANGIOGRAM;  Surgeon: Ople Girgis M Martinique, MD;  Location: [facility] CATH LAB;  Service: Cardiovascular;  Laterality: N/A;    TONSILLECTOMY  1941, [place], 2007   Bilateral     OB History     Gravida  4   Para  2   Term  2   Preterm      AB      Living  0      SAB      IAB      Ectopic      Multiple      Live Births              Family History  Problem Relation Age of Onset   Kidney disease Daughter 63   Cancer Daughter    Heart attack Father 67   Alzheimer's disease Sister    Heart disease Sister    Alzheimer's disease Sister     Social History   Tobacco Use   Smoking status: Former    Packs/day: 0.50    Years: 20.00    Pack years: 10.00    Types: Cigarettes    Quit date: 07/11/1973    Years since quitting: 47.7   Smokeless tobacco: Never  Vaping Use   Vaping Use: Never used  Substance Use Topics   Alcohol use: No   Drug use: No    Home Medications Prior to Admission medications   Medication Sig Start Date End Date Taking? Authorizing Provider  acetaminophen (TYLENOL) 500 MG tablet Take 1,000 mg by mouth in the morning and at bedtime.   Yes [provider]  Amino Acids-Protein Hydrolys (PRO-STAT AWC) LIQD Take 30 mLs by mouth daily.   Yes [provider]  atorvastatin (LIPITOR) 20 MG tablet Take 1 tablet (20 mg total) by mouth daily. 01/08/21 07/07/21 Yes Loel Dubonnet, NP  Dextromethorphan-guaiFENesin (ROBITUSSIN DM PO) Take 10 mLs by mouth every 6 (six) hours as needed (cough/congestion).   Yes [provider]  Docusate Sodium 100 MG capsule Take 100 mg by mouth daily.   Yes [provider]  famotidine (PEPCID) 20 MG tablet Take 20 mg by mouth daily.   Yes [provider]  gabapentin (NEURONTIN) 300 MG capsule Take 1 capsule (300 mg total) by mouth 3 (three) times daily. 11/23/20  Yes Angiulli, Lavon Paganini, PA-C  guaiFENesin (ROBITUSSIN) 100 MG/5ML liquid Take 100 mg by mouth every 8 (eight) hours as needed for cough.   Yes [provider]  lipase/protease/amylase (CREON) 36000 UNITS  CPEP capsule Take 1 capsule (36,000 Units total) by mouth 3 (three) times daily before meals. 10/09/20  Yes Hoyt Koch, MD  midodrine (PROAMATINE) 5 MG tablet Take 1 tablet (5 mg total) by mouth 3 (three) times daily with meals. 11/23/20  Yes Angiulli, Lavon Paganini, PA-C  mirtazapine (REMERON) 30 MG tablet Take 15 mg by mouth in the morning and at bedtime.   Yes [provider]  Multiple Vitamin (  MULTIVITAMIN WITH MINERALS) TABS tablet Take 1 tablet by mouth daily. 11/05/20  Yes Mercy Riding, MD  nitroGLYCERIN (NITROSTAT) 0.3 MG SL tablet Place 0.3 mg under the tongue every 5 (five) minutes x 3 doses as needed for chest pain.   Yes [provider]  Nutritional Supplements (ENSURE ORIGINAL PO) Take 237 mLs by mouth 4 (four) times daily.   Yes [provider]  oxyCODONE (OXY IR/ROXICODONE) 5 MG immediate release tablet Take 1 tablet (5 mg total) by mouth every 4 (four) hours as needed for severe pain. 11/23/20  Yes Angiulli, Lavon Paganini, PA-C  predniSONE (DELTASONE) 5 MG tablet TAKE 1 TABLET DAILY WITH BREAKFAST Patient taking differently: Take 5 mg by mouth daily with breakfast. 05/08/20  Yes Mannam, Praveen, MD  mycophenolate (CELLCEPT) 500 MG tablet TAKE 2 TABLETS BY MOUTH TWICE DAILY Patient not taking: No sig reported 01/18/21   Marshell Garfinkel, MD    Allergies    Crestor [rosuvastatin], Lactose intolerance (gi), Penicillins, and Imuran [azathioprine]  Review of Systems   Review of Systems  Cardiovascular:  Negative for chest pain.  Gastrointestinal:  Negative for abdominal pain.  All other systems reviewed and are negative.  Physical Exam Updated Vital Signs BP 119/75   Pulse 96   Temp 98.4 F (36.9 C) (Oral)   Resp 18   Ht '5\' 4"'$  (1.626 m)   Wt 45.4 kg   SpO2 99%   BMI 17.16 kg/m   Physical Exam Vitals and nursing note reviewed.  Constitutional:      General: She is not in acute distress.    Appearance: Normal appearance. She is well-developed.   HENT:     Head: Normocephalic.     Comments: Contusion to the left forehead noted. Eyes:     Conjunctiva/sclera: Conjunctivae normal.     Pupils: Pupils are equal, round, and reactive to light.  Cardiovascular:     Rate and Rhythm: Normal rate and regular rhythm.     Heart sounds: Normal heart sounds.  Pulmonary:     Effort: Pulmonary effort is normal. No respiratory distress.     Breath sounds: Normal breath sounds.  Abdominal:     General: There is no distension.     Palpations: Abdomen is soft.     Tenderness: There is no abdominal tenderness.  Musculoskeletal:        General: No deformity. Normal range of motion.     Cervical back: Normal range of motion and neck supple.     Comments: Small area of ecchymosis to the left lateral elbow.  Left elbow is with full active range of motion.  Skin:    General: Skin is warm and dry.  Neurological:     General: No focal deficit present.     Mental Status: She is alert and oriented to person, place, and time. Mental status is at baseline.     Comments: GCS 15    ED Results / Procedures / Treatments   Labs (all labs ordered are listed, but only abnormal results are displayed) Labs Reviewed - No data to display  EKG None  Radiology DG Pelvis 1-2 Views  Result Date: 03/22/2021 CLINICAL DATA:  Fall from wheelchair with pelvic pain, initial encounter EXAM: PELVIS - 1 VIEW COMPARISON:  None. FINDINGS: Pelvic ring is intact. No acute fracture is noted. Fecal material is noted throughout the colon. No soft tissue changes are seen. IMPRESSION: Changes suggestive of colonic constipation. No acute bony abnormality is noted. Electronically Signed  By: Inez Catalina M.D.   On: 03/22/2021 12:26   DG Elbow 2 Views Left  Result Date: 03/22/2021 CLINICAL DATA:  Fall from wheelchair with elbow pain, initial encounter EXAM: LEFT ELBOW - 2 VIEW COMPARISON:  None. FINDINGS: There is no evidence of fracture, dislocation, or joint effusion. There is  no evidence of arthropathy or other focal bone abnormality. Soft tissues are unremarkable. IMPRESSION: No acute abnormality noted. Electronically Signed   By: Inez Catalina M.D.   On: 03/22/2021 12:26   CT Head Wo Contrast  Result Date: 03/22/2021 CLINICAL DATA:  Recent fall from wheelchair with headaches and neck pain, initial encounter EXAM: CT HEAD WITHOUT CONTRAST CT CERVICAL SPINE WITHOUT CONTRAST TECHNIQUE: Multidetector CT imaging of the head and cervical spine was performed following the standard protocol without intravenous contrast. Multiplanar CT image reconstructions of the cervical spine were also generated. COMPARISON:  01/24/2020, 10/24/2020 FINDINGS: CT HEAD FINDINGS Brain: No evidence of acute infarction, hemorrhage, hydrocephalus, extra-axial collection or mass lesion/mass effect. Chronic atrophic and ischemic changes are again identified and stable. Vascular: No hyperdense vessel or unexpected calcification. Skull: Normal. Negative for fracture or focal lesion. Sinuses/Orbits: Paranasal sinuses are well aerated with mucosal changes and air-fluid levels within the sphenoid sinus stable in appearance from the prior exam. Other: None CT CERVICAL SPINE FINDINGS Alignment: Within normal limits. Skull base and vertebrae: Seven cervical segments are well visualized. Vertebral body height is well maintained. Disc space narrowing is noted from C5-T1 with mild osteophytic changes. Mild facet hypertrophic changes are noted. No findings to suggest acute fracture or acute facet abnormality are noted. The odontoid is within normal limits. Soft tissues and spinal canal: Surrounding soft tissue structures are within normal limits. No focal hematoma is noted. Upper chest: Visualized lung apices demonstrate emphysematous change with mild apical scarring. Other: None IMPRESSION: CT of the head: Chronic atrophic and ischemic changes without acute intracranial abnormality. Stable air-fluid levels and mucosal  changes within the sphenoid sinus. CT of the cervical spine: Multilevel degenerative change without acute bony abnormality. Electronically Signed   By: Inez Catalina M.D.   On: 03/22/2021 12:44   CT Cervical Spine Wo Contrast  Result Date: 03/22/2021 CLINICAL DATA:  Recent fall from wheelchair with headaches and neck pain, initial encounter EXAM: CT HEAD WITHOUT CONTRAST CT CERVICAL SPINE WITHOUT CONTRAST TECHNIQUE: Multidetector CT imaging of the head and cervical spine was performed following the standard protocol without intravenous contrast. Multiplanar CT image reconstructions of the cervical spine were also generated. COMPARISON:  01/24/2020, 10/24/2020 FINDINGS: CT HEAD FINDINGS Brain: No evidence of acute infarction, hemorrhage, hydrocephalus, extra-axial collection or mass lesion/mass effect. Chronic atrophic and ischemic changes are again identified and stable. Vascular: No hyperdense vessel or unexpected calcification. Skull: Normal. Negative for fracture or focal lesion. Sinuses/Orbits: Paranasal sinuses are well aerated with mucosal changes and air-fluid levels within the sphenoid sinus stable in appearance from the prior exam. Other: None CT CERVICAL SPINE FINDINGS Alignment: Within normal limits. Skull base and vertebrae: Seven cervical segments are well visualized. Vertebral body height is well maintained. Disc space narrowing is noted from C5-T1 with mild osteophytic changes. Mild facet hypertrophic changes are noted. No findings to suggest acute fracture or acute facet abnormality are noted. The odontoid is within normal limits. Soft tissues and spinal canal: Surrounding soft tissue structures are within normal limits. No focal hematoma is noted. Upper chest: Visualized lung apices demonstrate emphysematous change with mild apical scarring. Other: None IMPRESSION: CT of the head: Chronic atrophic  and ischemic changes without acute intracranial abnormality. Stable air-fluid levels and mucosal  changes within the sphenoid sinus. CT of the cervical spine: Multilevel degenerative change without acute bony abnormality. Electronically Signed   By: Inez Catalina M.D.   On: 03/22/2021 12:44   DG Chest Port 1 View  Result Date: 03/22/2021 CLINICAL DATA:  Patient fell out of wheelchair. EXAM: PORTABLE CHEST 1 VIEW COMPARISON:  Radiograph 10/23/2020 FINDINGS: Normal cardiac silhouette. Peripheral interstitial lung disease similar prior. No focal consolidation. No pneumothorax. Fracture identified. IMPRESSION: 1. No evidence of thoracic trauma. 2. Chronic interstitial lung disease not changed from comparison exam. Electronically Signed   By: Suzy Bouchard M.D.   On: 03/22/2021 12:27    Procedures Procedures   Medications Ordered in ED Medications - No data to display  ED Course  I have reviewed the triage vital signs and the nursing notes.  Pertinent labs & imaging results that were available during my care of the patient were reviewed by me and considered in my medical decision making (see chart for details).    MDM Rules/Calculators/A&P                           MDM  MSE complete  [name] [name] was evaluated in Emergency Department on 03/22/2021 for the symptoms described in the history of present illness. She was evaluated in the context of the global COVID-19 pandemic, which necessitated consideration that the patient might be at risk for infection with the SARS-CoV-2 virus that causes COVID-19. Institutional protocols and algorithms that pertain to the evaluation of patients at risk for COVID-19 are in a state of rapid change based on information released by regulatory bodies including the CDC and federal and state organizations. These policies and algorithms were followed during the patient's care in the ED.  Patient presented for evaluation after fall from her wheelchair.  Patient without evidence of significant traumatic injury on exam.  CT plain film imaging is also reassuring  without evidence of acute traumatic pathology found.  Patient feels improved after her ED evaluation.  She now desires discharge home.  Patient feels improved.  She does understand need for close follow-up.   Final Clinical Impression(s) / ED Diagnoses Final diagnoses:  Fall, initial encounter  Injury of head, initial encounter    Rx / DC Orders ED Discharge Orders     None        Valarie Merino, MD 03/22/21 1310

## 2021-03-23 DIAGNOSIS — Z9889 Other specified postprocedural states: Secondary | ICD-10-CM | POA: Diagnosis not present

## 2021-03-23 DIAGNOSIS — K9423 Gastrostomy malfunction: Secondary | ICD-10-CM | POA: Diagnosis not present

## 2021-03-23 DIAGNOSIS — R296 Repeated falls: Secondary | ICD-10-CM | POA: Diagnosis not present

## 2021-03-23 DIAGNOSIS — S31105D Unspecified open wound of abdominal wall, periumbilic region without penetration into peritoneal cavity, subsequent encounter: Secondary | ICD-10-CM | POA: Diagnosis not present

## 2021-03-23 DIAGNOSIS — E46 Unspecified protein-calorie malnutrition: Secondary | ICD-10-CM | POA: Diagnosis not present

## 2021-03-23 DIAGNOSIS — R21 Rash and other nonspecific skin eruption: Secondary | ICD-10-CM | POA: Diagnosis not present

## 2021-03-23 DIAGNOSIS — Z7409 Other reduced mobility: Secondary | ICD-10-CM | POA: Diagnosis not present

## 2021-03-26 ENCOUNTER — Non-Acute Institutional Stay: Payer: Medicare Other | Admitting: Student

## 2021-03-26 ENCOUNTER — Ambulatory Visit (HOSPITAL_COMMUNITY)
Admission: RE | Admit: 2021-03-26 | Discharge: 2021-03-26 | Disposition: A | Discharge status: Home / Self Care | Payer: Medicare Other | Source: Ambulatory Visit | Attending: Pulmonary Disease | Admitting: Pulmonary Disease

## 2021-03-26 ENCOUNTER — Other Ambulatory Visit: Payer: Self-pay

## 2021-03-26 DIAGNOSIS — T85528A Displacement of other gastrointestinal prosthetic devices, implants and grafts, initial encounter: Secondary | ICD-10-CM | POA: Diagnosis not present

## 2021-03-26 DIAGNOSIS — E43 Unspecified severe protein-calorie malnutrition: Secondary | ICD-10-CM | POA: Diagnosis not present

## 2021-03-26 DIAGNOSIS — J849 Interstitial pulmonary disease, unspecified: Secondary | ICD-10-CM | POA: Insufficient documentation

## 2021-03-26 DIAGNOSIS — Z515 Encounter for palliative care: Secondary | ICD-10-CM

## 2021-03-26 DIAGNOSIS — J479 Bronchiectasis, uncomplicated: Secondary | ICD-10-CM | POA: Diagnosis not present

## 2021-03-26 DIAGNOSIS — E860 Dehydration: Secondary | ICD-10-CM | POA: Diagnosis not present

## 2021-03-26 DIAGNOSIS — I700 Atherosclerosis of aorta: Secondary | ICD-10-CM | POA: Diagnosis not present

## 2021-03-29 NOTE — Progress Notes (Signed)
[facility] Consult Note Telephone: [phone]  Fax: [phone]    Date of encounter: 03/26/2021  PATIENT NAME: [name] [name] [address] [place] [address]   [phone] (home)  DOB: 12-17-1930 MRN: [id number] PRIMARY CARE PROVIDER:    Dr. Lincoln Brigham  REFERRING PROVIDER:   Dr. Lincoln Brigham  RESPONSIBLE PARTY:    Contact Information     Name Relation Home Work [facility] [phone]  [place], [place] [phone]  [phone]   [name] [phone]  [phone]        I met face to face with patient in the facility. Glean Hess Little is also present. Palliative Care was asked to follow this patient by consultation request of  Dr. Shon Hough to address advance care planning and complex medical decision making. This is a follow up visit.                                   ASSESSMENT AND PLAN / RECOMMENDATIONS:   Advance Care Planning/Goals of Care: Goals include to maximize quality of life and symptom management. Our advance care planning conversation included a discussion about:    The value and importance of advance care planning  Experiences with loved ones who have been seriously ill or have died  Exploration of personal, cultural or spiritual beliefs that might influence medical decisions  Exploration of goals of care in the event of a sudden injury or illness  CODE STATUS: Full Code  Symptom Management/Plan:  Protein calorie malnutrition-patient's g-tube removed. She is currently eating and drinking nutritional supplements. Continue Ensures QID, Prostat daily. Currently her weight is stable. Continue routine weights. Dehydration-Patient does appear to be dehydrated. Facility NP is ordering an additional bag of fluids secondary to dehydration.    G-tube site-patient has been consulted by surgical center. Patient continues to have food  and fluid contents coming from site. It does appear as though opening has decreased in size and the amount of contents from site has decreased. She does have ostomy bag in place to collect contents, help maintain skin integrity. Continue wound care to abdomen as directed. Discussed with facility NP; she is placing another request for GI appointment. Family friend, [name] who is present today is asked would family consider having G-tube replaced if needed. She states they would prefer to honor patient's wishes and hopeful site will close on its own.   Follow up Palliative Care Visit: Palliative care will continue to follow for complex medical decision making, advance care planning, and clarification of goals. Return in 4 weeks or prn.  I spent 25 minutes providing this consultation. More than 50% of the time in this consultation was spent in counseling and care coordination.    PPS: 30%  HOSPICE ELIGIBILITY/DIAGNOSIS: TBD  Chief Complaint: Palliative Medicine follow up visit.   HISTORY OF PRESENT ILLNESS:  [name] [name] is a 87 y.o. year old female  with protein calorie malnutrition, pulmonary fibrosis, CAD, diastolic CHF, iron deficiency anemia, polyneuropathy, RA. Patient was seen in ED on 03/22/21 due to fall with head strike; CT imaging with no acute findings.   Patient is going out today for a chest CT. She is accompanied by family friend [name] Little. Patient does report some abdominal pain; she had just received acetaminophen. [name] states that contents from patient's g-tube site has improved in the past several  days. She states patient had been being soaked, but this has improved. She also states she accompanied patient to appointment at surgical center and the opening had decreased in size. Patient continues to drink nutritional supplements. She has received IV fluids due to dehydration. Family would like for patient to have fluids as needed.   Patient received sitting up to w/c in  dining area. She has purple bruising to her forehead. She is able to answer questions. G-tube site not visualized as she is to leave soon for CT scan appointment. A 10-point review of systems is negative, except for the pertinent positives and negatives detailed in the HPI.    History obtained from review of EMR, discussion with primary team, and interview with family, facility staff/caregiver and/or Ms. Jeanpaul.  I reviewed available labs, medications, imaging, studies and related documents from the EMR.  Records reviewed and summarized above.    Physical Exam: Weight: 100 pounds Pulse 100, resp 20, sats 98% at 2 lpm Constitutional: NAD General: frail appearing, very thin, chronically ill appearing EYES: anicteric sclera, lids intact, no discharge  ENMT: intact hearing, oral mucous membranes moist, dentition intact CV: S1S2, RRR, no LE edema Pulmonary: LCTA, no increased work of breathing, no cough Abdomen: normo-active BS + 4 quadrants, soft and non tender, no ascites GU: deferred MSK: sarcopenia, non- ambulatory Skin: warm and dry, sacral wound; abdomen not visualized Neuro: generalized weakness, A & O x 2, forgetful Psych: non-anxious affect, pleasant Hem/lymph/immuno: no widespread bruising   Thank you for the opportunity to participate in the care of [name] [name].  The palliative care team will continue to follow. Please call our office at [phone] if we can be of additional assistance.   Ezekiel Slocumb, NP   COVID-19 PATIENT SCREENING TOOL Asked and negative response unless otherwise noted:   Have you had symptoms of covid, tested positive or been in contact with someone with symptoms/positive test in the past 5-10 days? No

## 2021-04-05 ENCOUNTER — Telehealth: Payer: Self-pay | Admitting: Pulmonary Disease

## 2021-04-05 ENCOUNTER — Other Ambulatory Visit: Payer: Self-pay | Admitting: Pulmonary Disease

## 2021-04-05 DIAGNOSIS — R911 Solitary pulmonary nodule: Secondary | ICD-10-CM

## 2021-04-05 DIAGNOSIS — J849 Interstitial pulmonary disease, unspecified: Secondary | ICD-10-CM

## 2021-04-05 NOTE — Telephone Encounter (Signed)
LMTCB

## 2021-04-05 NOTE — Telephone Encounter (Signed)
Nephew , who is on DPR, called regarding CT results. I went over CT results and he verbalized understanding. I have put in order for CT chest for 3 months and informed him one of our [facility] will be calling to schedule. Nothing further needed.

## 2021-04-06 ENCOUNTER — Inpatient Hospital Stay (HOSPITAL_COMMUNITY)
Admission: EM | Admit: 2021-04-06 | Discharge: 2021-05-11 | DRG: 919 | Disposition: E | Payer: Medicare Other | Source: Skilled Nursing Facility | Attending: Family Medicine | Admitting: Family Medicine

## 2021-04-06 ENCOUNTER — Emergency Department (HOSPITAL_COMMUNITY): Payer: Medicare Other

## 2021-04-06 ENCOUNTER — Other Ambulatory Visit: Payer: Self-pay

## 2021-04-06 DIAGNOSIS — Z91011 Allergy to milk products: Secondary | ICD-10-CM

## 2021-04-06 DIAGNOSIS — R531 Weakness: Secondary | ICD-10-CM

## 2021-04-06 DIAGNOSIS — R627 Adult failure to thrive: Secondary | ICD-10-CM | POA: Diagnosis present

## 2021-04-06 DIAGNOSIS — I5032 Chronic diastolic (congestive) heart failure: Secondary | ICD-10-CM | POA: Diagnosis present

## 2021-04-06 DIAGNOSIS — Z7189 Other specified counseling: Secondary | ICD-10-CM

## 2021-04-06 DIAGNOSIS — R21 Rash and other nonspecific skin eruption: Secondary | ICD-10-CM | POA: Diagnosis not present

## 2021-04-06 DIAGNOSIS — Z87891 Personal history of nicotine dependence: Secondary | ICD-10-CM

## 2021-04-06 DIAGNOSIS — R54 Age-related physical debility: Secondary | ICD-10-CM | POA: Diagnosis present

## 2021-04-06 DIAGNOSIS — S31109A Unspecified open wound of abdominal wall, unspecified quadrant without penetration into peritoneal cavity, initial encounter: Secondary | ICD-10-CM

## 2021-04-06 DIAGNOSIS — R410 Disorientation, unspecified: Secondary | ICD-10-CM

## 2021-04-06 DIAGNOSIS — A419 Sepsis, unspecified organism: Secondary | ICD-10-CM | POA: Diagnosis not present

## 2021-04-06 DIAGNOSIS — K5641 Fecal impaction: Secondary | ICD-10-CM | POA: Diagnosis not present

## 2021-04-06 DIAGNOSIS — E860 Dehydration: Secondary | ICD-10-CM | POA: Diagnosis present

## 2021-04-06 DIAGNOSIS — Y833 Surgical operation with formation of external stoma as the cause of abnormal reaction of the patient, or of later complication, without mention of misadventure at the time of the procedure: Secondary | ICD-10-CM | POA: Diagnosis present

## 2021-04-06 DIAGNOSIS — Z8249 Family history of ischemic heart disease and other diseases of the circulatory system: Secondary | ICD-10-CM

## 2021-04-06 DIAGNOSIS — J9611 Chronic respiratory failure with hypoxia: Secondary | ICD-10-CM | POA: Diagnosis present

## 2021-04-06 DIAGNOSIS — L89153 Pressure ulcer of sacral region, stage 3: Secondary | ICD-10-CM | POA: Diagnosis not present

## 2021-04-06 DIAGNOSIS — Z7952 Long term (current) use of systemic steroids: Secondary | ICD-10-CM

## 2021-04-06 DIAGNOSIS — D849 Immunodeficiency, unspecified: Secondary | ICD-10-CM | POA: Diagnosis present

## 2021-04-06 DIAGNOSIS — Z86718 Personal history of other venous thrombosis and embolism: Secondary | ICD-10-CM

## 2021-04-06 DIAGNOSIS — G9341 Metabolic encephalopathy: Secondary | ICD-10-CM | POA: Diagnosis present

## 2021-04-06 DIAGNOSIS — K9423 Gastrostomy malfunction: Secondary | ICD-10-CM | POA: Diagnosis not present

## 2021-04-06 DIAGNOSIS — R000 Tachycardia, unspecified: Secondary | ICD-10-CM | POA: Diagnosis not present

## 2021-04-06 DIAGNOSIS — Z515 Encounter for palliative care: Secondary | ICD-10-CM

## 2021-04-06 DIAGNOSIS — M8668 Other chronic osteomyelitis, other site: Secondary | ICD-10-CM

## 2021-04-06 DIAGNOSIS — L309 Dermatitis, unspecified: Secondary | ICD-10-CM | POA: Diagnosis present

## 2021-04-06 DIAGNOSIS — E162 Hypoglycemia, unspecified: Secondary | ICD-10-CM | POA: Diagnosis present

## 2021-04-06 DIAGNOSIS — Z66 Do not resuscitate: Secondary | ICD-10-CM | POA: Diagnosis not present

## 2021-04-06 DIAGNOSIS — E43 Unspecified severe protein-calorie malnutrition: Secondary | ICD-10-CM | POA: Diagnosis not present

## 2021-04-06 DIAGNOSIS — E785 Hyperlipidemia, unspecified: Secondary | ICD-10-CM | POA: Diagnosis present

## 2021-04-06 DIAGNOSIS — Z9981 Dependence on supplemental oxygen: Secondary | ICD-10-CM

## 2021-04-06 DIAGNOSIS — M810 Age-related osteoporosis without current pathological fracture: Secondary | ICD-10-CM | POA: Diagnosis present

## 2021-04-06 DIAGNOSIS — I6782 Cerebral ischemia: Secondary | ICD-10-CM | POA: Diagnosis not present

## 2021-04-06 DIAGNOSIS — E870 Hyperosmolality and hypernatremia: Secondary | ICD-10-CM | POA: Diagnosis not present

## 2021-04-06 DIAGNOSIS — R4182 Altered mental status, unspecified: Secondary | ICD-10-CM | POA: Diagnosis present

## 2021-04-06 DIAGNOSIS — G9340 Encephalopathy, unspecified: Secondary | ICD-10-CM | POA: Diagnosis not present

## 2021-04-06 DIAGNOSIS — Z7401 Bed confinement status: Secondary | ICD-10-CM

## 2021-04-06 DIAGNOSIS — M4628 Osteomyelitis of vertebra, sacral and sacrococcygeal region: Secondary | ICD-10-CM | POA: Diagnosis present

## 2021-04-06 DIAGNOSIS — Z20822 Contact with and (suspected) exposure to covid-19: Secondary | ICD-10-CM | POA: Diagnosis present

## 2021-04-06 DIAGNOSIS — S31105D Unspecified open wound of abdominal wall, periumbilic region without penetration into peritoneal cavity, subsequent encounter: Secondary | ICD-10-CM | POA: Diagnosis not present

## 2021-04-06 DIAGNOSIS — G319 Degenerative disease of nervous system, unspecified: Secondary | ICD-10-CM | POA: Diagnosis not present

## 2021-04-06 DIAGNOSIS — Z888 Allergy status to other drugs, medicaments and biological substances status: Secondary | ICD-10-CM

## 2021-04-06 DIAGNOSIS — R509 Fever, unspecified: Secondary | ICD-10-CM | POA: Diagnosis not present

## 2021-04-06 DIAGNOSIS — T8189XA Other complications of procedures, not elsewhere classified, initial encounter: Principal | ICD-10-CM | POA: Diagnosis present

## 2021-04-06 DIAGNOSIS — D72829 Elevated white blood cell count, unspecified: Secondary | ICD-10-CM

## 2021-04-06 DIAGNOSIS — R0689 Other abnormalities of breathing: Secondary | ICD-10-CM | POA: Diagnosis not present

## 2021-04-06 DIAGNOSIS — Z880 Allergy status to penicillin: Secondary | ICD-10-CM

## 2021-04-06 DIAGNOSIS — R109 Unspecified abdominal pain: Secondary | ICD-10-CM | POA: Diagnosis not present

## 2021-04-06 DIAGNOSIS — Z681 Body mass index (BMI) 19 or less, adult: Secondary | ICD-10-CM

## 2021-04-06 DIAGNOSIS — R404 Transient alteration of awareness: Secondary | ICD-10-CM | POA: Diagnosis not present

## 2021-04-06 DIAGNOSIS — Z79899 Other long term (current) drug therapy: Secondary | ICD-10-CM

## 2021-04-06 DIAGNOSIS — E876 Hypokalemia: Secondary | ICD-10-CM | POA: Diagnosis not present

## 2021-04-06 DIAGNOSIS — R9431 Abnormal electrocardiogram [ECG] [EKG]: Secondary | ICD-10-CM | POA: Diagnosis not present

## 2021-04-06 DIAGNOSIS — Z96653 Presence of artificial knee joint, bilateral: Secondary | ICD-10-CM | POA: Diagnosis present

## 2021-04-06 DIAGNOSIS — J8410 Pulmonary fibrosis, unspecified: Secondary | ICD-10-CM | POA: Diagnosis present

## 2021-04-06 DIAGNOSIS — I2510 Atherosclerotic heart disease of native coronary artery without angina pectoris: Secondary | ICD-10-CM | POA: Diagnosis present

## 2021-04-06 DIAGNOSIS — L089 Local infection of the skin and subcutaneous tissue, unspecified: Secondary | ICD-10-CM

## 2021-04-06 DIAGNOSIS — M069 Rheumatoid arthritis, unspecified: Secondary | ICD-10-CM | POA: Diagnosis present

## 2021-04-06 DIAGNOSIS — K316 Fistula of stomach and duodenum: Secondary | ICD-10-CM | POA: Diagnosis present

## 2021-04-06 DIAGNOSIS — L89150 Pressure ulcer of sacral region, unstageable: Secondary | ICD-10-CM

## 2021-04-06 DIAGNOSIS — Z86711 Personal history of pulmonary embolism: Secondary | ICD-10-CM

## 2021-04-06 LAB — CBC WITH DIFFERENTIAL/PLATELET
Abs Immature Granulocytes: 0.16 10*3/uL — ABNORMAL HIGH (ref 0.00–0.07)
Basophils Absolute: 0.1 10*3/uL (ref 0.0–0.1)
Basophils Relative: 0 %
Eosinophils Absolute: 0 10*3/uL (ref 0.0–0.5)
Eosinophils Relative: 0 %
HCT: 41.3 % (ref 36.0–46.0)
Hemoglobin: 12.6 g/dL (ref 12.0–15.0)
Immature Granulocytes: 1 %
Lymphocytes Relative: 27 %
Lymphs Abs: 4.4 10*3/uL — ABNORMAL HIGH (ref 0.7–4.0)
MCH: 29.2 pg (ref 26.0–34.0)
MCHC: 30.5 g/dL (ref 30.0–36.0)
MCV: 95.6 fL (ref 80.0–100.0)
Monocytes Absolute: 1.7 10*3/uL — ABNORMAL HIGH (ref 0.1–1.0)
Monocytes Relative: 11 %
Neutro Abs: 9.7 10*3/uL — ABNORMAL HIGH (ref 1.7–7.7)
Neutrophils Relative %: 61 %
Platelets: 328 10*3/uL (ref 150–400)
RBC: 4.32 MIL/uL (ref 3.87–5.11)
RDW: 14.6 % (ref 11.5–15.5)
WBC: 16 10*3/uL — ABNORMAL HIGH (ref 4.0–10.5)
nRBC: 0 % (ref 0.0–0.2)

## 2021-04-06 LAB — URINALYSIS, ROUTINE W REFLEX MICROSCOPIC
Bacteria, UA: NONE SEEN
Bilirubin Urine: NEGATIVE
Glucose, UA: NEGATIVE mg/dL
Ketones, ur: NEGATIVE mg/dL
Leukocytes,Ua: NEGATIVE
Nitrite: NEGATIVE
Specific Gravity, Urine: 1.01 (ref 1.005–1.030)
pH: 7 (ref 5.0–8.0)

## 2021-04-06 LAB — COMPREHENSIVE METABOLIC PANEL
ALT: 10 U/L (ref 0–44)
AST: 16 U/L (ref 15–41)
Albumin: 2.5 g/dL — ABNORMAL LOW (ref 3.5–5.0)
Alkaline Phosphatase: 120 U/L (ref 38–126)
Anion gap: 9 (ref 5–15)
BUN: 15 mg/dL (ref 8–23)
CO2: 33 mmol/L — ABNORMAL HIGH (ref 22–32)
Calcium: 9.6 mg/dL (ref 8.9–10.3)
Chloride: 104 mmol/L (ref 98–111)
Creatinine, Ser: 0.73 mg/dL (ref 0.44–1.00)
GFR, Estimated: >60 mL/min (ref >60)
Glucose, Bld: 106 mg/dL — ABNORMAL HIGH (ref 70–99)
Potassium: 4 mmol/L (ref 3.5–5.1)
Sodium: 146 mmol/L — ABNORMAL HIGH (ref 135–145)
Total Bilirubin: 0.7 mg/dL (ref 0.3–1.2)
Total Protein: 7.7 g/dL (ref 6.5–8.1)

## 2021-04-06 LAB — TYPE AND SCREEN
ABO/RH(D): O POS
Antibody Screen: NEGATIVE

## 2021-04-06 LAB — RESP PANEL BY RT-PCR (FLU A&B, COVID) ARPGX2
Influenza A by PCR: NEGATIVE
Influenza B by PCR: NEGATIVE
SARS Coronavirus 2 by RT PCR: NEGATIVE

## 2021-04-06 LAB — PROTIME-INR
INR: 1.1 (ref 0.8–1.2)
Prothrombin Time: 14.7 seconds (ref 11.4–15.2)

## 2021-04-06 LAB — LACTIC ACID, PLASMA: Lactic Acid, Venous: 1.5 mmol/L (ref 0.5–1.9)

## 2021-04-06 MED ORDER — SODIUM CHLORIDE 0.9 % IV SOLN: Freq: Once | INTRAVENOUS | Status: AC

## 2021-04-06 MED ORDER — LACTATED RINGERS IV BOLUS
1000.0000 mL | Freq: Once | INTRAVENOUS | Status: AC
  Administered 2021-04-06: 1000 mL via INTRAVENOUS

## 2021-04-06 NOTE — ED Provider Notes (Signed)
[facility] DEPT Provider Note: Georgena Spurling, MD, FACEP  CSN: [id number] MRN: [id number] ARRIVAL: 04/05/2021 at 2202 ROOM: WA14/WA14   CHIEF COMPLAINT  Altered Mental Status  Level 5 caveat: Altered mental status HISTORY OF PRESENT ILLNESS  03/24/2021 10:49 PM [name] [name] is a 87 y.o. female who has a chronic nonhealing surgical wound of her mid abdomen.  She also had a gastrostomy tube changed 03/01/2021 and "the wound has still not healed".  She is here with an altered mental status.  Living facility staff reports she is normal awake, alert and oriented x4.  Around 6 PM they noted her to awaken from a nap and alert only to self.  On arrival here she her speech is incomprehensible and she is not able to give any history.  She was noted to be mildly tachycardic on arrival with a temperature of 99.9.  Although she does not meet sepsis criteria, the sepsis protocol was initiated.    Past Medical History:  Diagnosis Date   Allergic rhinitis    Allergy    CAD (coronary artery disease)    Mild CAD by cath 2008   CHF (congestive heart failure) (HCC)    DJD (degenerative joint disease)    rheumatoid   DVT (deep venous thrombosis) ([place])    Dyslipidemia    History of echocardiogram    Echo 12/2018: EF 60-65, mod asymmetric LVH, Gr 1 DD   History of nuclear stress test    Myoview 5/17:  EF 74%, normal perfusion, low risk // Myoview 12/2018:  EF 89, very mild ischemia in inferoapical wall; Low Risk   History of pulmonary embolism    Hyperlipidemia    Insomnia    Osteoporosis    Psoriasis    Pulmonary fibrosis ([place])    Rheumatoid arthritis ([place])     Past Surgical History:  Procedure Laterality Date   ABDOMINAL HYSTERECTOMY  1974   APPENDECTOMY  1959   CATARACT EXTRACTION     IR [place] TUBE CHANGE  11/05/2020   IR [place] GASTRO/COLONIC TUBE PERCUT W/FLUORO  10/29/2020   IR [place] GASTRO/COLONIC TUBE PERCUT W/FLUORO  11/17/2020   IR [place] GASTRO/COLONIC TUBE PERCUT W/FLUORO  01/15/2021    LAPAROTOMY N/A 08/23/2020   Procedure: EXPLORATORY LAPAROTOMY small bowel resection gastrostomy tube placement;  Surgeon: Dwan Bolt, MD;  Location: [facility] ORS;  Service: General;  Laterality: N/A;   LEFT HEART CATHETERIZATION WITH CORONARY ANGIOGRAM N/A 06/18/2013   Procedure: LEFT HEART CATHETERIZATION WITH CORONARY ANGIOGRAM;  Surgeon: Peter M Martinique, MD;  Location: [facility] CATH LAB;  Service: Cardiovascular;  Laterality: N/A;   TONSILLECTOMY  1941, [place], 2007   Bilateral    Family History  Problem Relation Age of Onset   Kidney disease Daughter 1   Cancer Daughter    Heart attack Father 74   Alzheimer's disease Sister    Heart disease Sister    Alzheimer's disease Sister     Social History   Tobacco Use   Smoking status: Former    Packs/day: 0.50    Years: 20.00    Pack years: 10.00    Types: Cigarettes    Quit date: 07/11/1973    Years since quitting: 47.7   Smokeless tobacco: Never  Vaping Use   Vaping Use: Never used  Substance Use Topics   Alcohol use: No   Drug use: No    Prior to Admission medications   Medication Sig Start Date End Date  Taking? Authorizing Provider  acetaminophen (TYLENOL) 500 MG tablet Take 1,000 mg by mouth in the morning and at bedtime.   Yes [provider]  Amino Acids-Protein Hydrolys (PRO-STAT AWC) LIQD Take 30 mLs by mouth daily.   Yes [provider]  atorvastatin (LIPITOR) 20 MG tablet Take 1 tablet (20 mg total) by mouth daily. Patient taking differently: Take 10 mg by mouth daily. 01/08/21 07/07/21 Yes Loel Dubonnet, NP  Docusate Sodium 100 MG capsule Take 100 mg by mouth daily.   Yes [provider]  famotidine (PEPCID) 20 MG tablet Take 20 mg by mouth daily.   Yes [provider]  gabapentin (NEURONTIN) 300 MG capsule Take 1 capsule (300 mg total) by mouth 3 (three) times daily. 11/23/20  Yes Angiulli, Lavon Paganini, PA-C  lipase/protease/amylase (CREON) 36000 UNITS CPEP  capsule Take 1 capsule (36,000 Units total) by mouth 3 (three) times daily before meals. 10/09/20  Yes Hoyt Koch, MD  midodrine (PROAMATINE) 5 MG tablet Take 1 tablet (5 mg total) by mouth 3 (three) times daily with meals. 11/23/20  Yes Angiulli, Lavon Paganini, PA-C  mirtazapine (REMERON) 30 MG tablet Take 15 mg by mouth at bedtime.   Yes [provider]  Multiple Vitamin (MULTIVITAMIN WITH MINERALS) TABS tablet Take 1 tablet by mouth daily. 11/05/20  Yes Mercy Riding, MD  Nutritional Supplements (ENSURE ORIGINAL PO) Take 237 mLs by mouth 4 (four) times daily.   Yes [provider]  oxyCODONE (OXY IR/ROXICODONE) 5 MG immediate release tablet Take 1 tablet (5 mg total) by mouth every 4 (four) hours as needed for severe pain. 11/23/20  Yes Angiulli, Lavon Paganini, PA-C  predniSONE (DELTASONE) 5 MG tablet TAKE 1 TABLET DAILY WITH BREAKFAST Patient taking differently: Take 5 mg by mouth daily with breakfast. 05/08/20  Yes Mannam, Praveen, MD  Dextromethorphan-guaiFENesin (ROBITUSSIN DM PO) Take 10 mLs by mouth every 6 (six) hours as needed (cough/congestion).    [provider]  guaiFENesin (ROBITUSSIN) 100 MG/5ML liquid Take 100 mg by mouth every 8 (eight) hours as needed for cough.    [provider]  mycophenolate (CELLCEPT) 500 MG tablet TAKE 2 TABLETS BY MOUTH TWICE DAILY Patient not taking: No sig reported 01/18/21   Mannam, Hart Robinsons, MD  nitroGLYCERIN (NITROSTAT) 0.3 MG SL tablet Place 0.3 mg under the tongue every 5 (five) minutes x 3 doses as needed for chest pain.    [provider]    Allergies Crestor [rosuvastatin], Lactose intolerance (gi), Penicillins, and Imuran [azathioprine]   REVIEW OF SYSTEMS  Negative except as noted here or in the History of Present Illness.   PHYSICAL EXAMINATION  Initial Vital Signs Blood pressure 119/75, pulse (!) 105, temperature 99.9 F (37.7 C), temperature source Rectal, resp. rate (!) 23, height 5\' 4"  (1.626  m), SpO2 100 %.  Examination General: Well-developed, cachectic female in no acute distress; appearance consistent with age of record HENT: normocephalic; atraumatic Eyes: pupils equal, round and reactive to light; extraocular muscles grossly intact Neck: supple Heart: regular rate and rhythm; tachycardia Lungs: clear to auscultation bilaterally Abdomen: soft; bowel sounds present; chronic appearing granulating wound of left abdomen, gastrostomy with no gastrostomy tube in place, widespread shallow ulceration of abdominal skin:      Rectal: Large, firm fecal impaction in rectum with liquid stool passing around impaction; perianal dermatitis; sacral decubitus ulcer:    Extremities: No deformity; full range of motion; pulses normal Neurologic: Awake, alert; incomprehensible speech; noted to move all extremities  Skin: Warm and dry Psychiatric: Cannot assess   RESULTS  Summary of this visit's results, reviewed and interpreted by myself:   EKG Interpretation  Date/Time:  Tuesday April 06 2021 22:22:03 EDT Ventricular Rate:  103 PR Interval:  142 QRS Duration: 76 QT Interval:  323 QTC Calculation: 423 R Axis:     Text Interpretation: Normal sinus rhythm No significant change since last tracing Confirmed by Regan Lemming ([id number]) on 03/13/2021 10:48:11 PM       Laboratory Studies: Results for orders placed or performed during the hospital encounter of 04/08/2021 (from the past 24 hour(s))  Comprehensive metabolic panel     Status: Abnormal   Collection Time: 03/14/2021 10:16 PM  Result Value Ref Range   Sodium 146 (H) 135 - 145 mmol/L   Potassium 4.0 3.5 - 5.1 mmol/L   Chloride 104 98 - 111 mmol/L   CO2 33 (H) 22 - 32 mmol/L   Glucose, Bld 106 (H) 70 - 99 mg/dL   BUN 15 8 - 23 mg/dL   Creatinine, Ser 0.73 0.44 - 1.00 mg/dL   Calcium 9.6 8.9 - 10.3 mg/dL   Total Protein 7.7 6.5 - 8.1 g/dL   Albumin 2.5 (L) 3.5 - 5.0 g/dL   AST 16 15 - 41 U/L   ALT 10 0 - 44 U/L    Alkaline Phosphatase 120 38 - 126 U/L   Total Bilirubin 0.7 0.3 - 1.2 mg/dL   GFR, Estimated >60 >60 mL/min   Anion gap 9 5 - 15  Lactic acid, plasma     Status: None   Collection Time: 03/22/2021 10:16 PM  Result Value Ref Range   Lactic Acid, Venous 1.5 0.5 - 1.9 mmol/L  CBC with Differential     Status: Abnormal   Collection Time: 03/19/2021 10:16 PM  Result Value Ref Range   WBC 16.0 (H) 4.0 - 10.5 K/uL   RBC 4.32 3.87 - 5.11 MIL/uL   Hemoglobin 12.6 12.0 - 15.0 g/dL   HCT 41.3 36.0 - 46.0 %   MCV 95.6 80.0 - 100.0 fL   MCH 29.2 26.0 - 34.0 pg   MCHC 30.5 30.0 - 36.0 g/dL   RDW 14.6 11.5 - 15.5 %   Platelets 328 150 - 400 K/uL   nRBC 0.0 0.0 - 0.2 %   Neutrophils Relative % 61 %   Neutro Abs 9.7 (H) 1.7 - 7.7 K/uL   Lymphocytes Relative 27 %   Lymphs Abs 4.4 (H) 0.7 - 4.0 K/uL   Monocytes Relative 11 %   Monocytes Absolute 1.7 (H) 0.1 - 1.0 K/uL   Eosinophils Relative 0 %   Eosinophils Absolute 0.0 0.0 - 0.5 K/uL   Basophils Relative 0 %   Basophils Absolute 0.1 0.0 - 0.1 K/uL   Immature Granulocytes 1 %   Abs Immature Granulocytes 0.16 (H) 0.00 - 0.07 K/uL  Protime-INR     Status: None   Collection Time: 03/11/2021 10:16 PM  Result Value Ref Range   Prothrombin Time 14.7 11.4 - 15.2 seconds   INR 1.1 0.8 - 1.2  Urinalysis, Routine w reflex microscopic Urine, In & Out Cath     Status: Abnormal   Collection Time: 03/19/2021 10:16 PM  Result Value Ref Range   Color, Urine YELLOW YELLOW   APPearance CLEAR CLEAR   Specific Gravity, Urine 1.010 1.005 - 1.030   pH 7.0 5.0 - 8.0   Glucose, UA NEGATIVE NEGATIVE mg/dL   Hgb urine dipstick TRACE (A) NEGATIVE  Bilirubin Urine NEGATIVE NEGATIVE   Ketones, ur NEGATIVE NEGATIVE mg/dL   Protein, ur TRACE (A) NEGATIVE mg/dL   Nitrite NEGATIVE NEGATIVE   Leukocytes,Ua NEGATIVE NEGATIVE   RBC / HPF 21-50 0 - 5 RBC/hpf   WBC, UA 0-5 0 - 5 WBC/hpf   Bacteria, UA NONE SEEN NONE SEEN   Squamous Epithelial / LPF 0-5 0 - 5   Mucus  PRESENT   Type and screen [place]     Status: None   Collection Time: 03/23/2021 10:40 PM  Result Value Ref Range   ABO/RH(D) O POS    Antibody Screen NEG    Sample Expiration      04/09/2021,2359 Performed at [facility] [address] [place], [place] [address]   Resp Panel by RT-PCR (Flu A&B, Covid) Nasopharyngeal Swab     Status: None   Collection Time: 03/26/2021 10:41 PM   Specimen: Nasopharyngeal Swab; Nasopharyngeal(NP) swabs in vial transport medium  Result Value Ref Range   SARS Coronavirus 2 by RT PCR NEGATIVE NEGATIVE   Influenza A by PCR NEGATIVE NEGATIVE   Influenza B by PCR NEGATIVE NEGATIVE  Aerobic/Anaerobic Culture w Gram Stain (surgical/deep wound)     Status: None (Preliminary result)   Collection Time: 04/02/2021 11:23 PM   Specimen: Abdomen; Wound  Result Value Ref Range   Specimen Description      ABDOMEN Performed at [facility] [address], [place], [place] [address]    Special Requests      NONE Performed at [facility], [place] [address], [place], [place] [address]    Gram Stain      FEW SQUAMOUS EPITHELIAL CELLS PRESENT FEW WBC SEEN MODERATE GRAM POSITIVE COCCI Performed at [facility], [place] [address], [place], [place] [address]    Culture PENDING    Report Status PENDING   Aerobic Culture w Gram Stain (superficial specimen)     Status: None (Preliminary result)   Collection Time: 04/09/2021 11:23 PM   Specimen: Abdomen; Wound  Result Value Ref Range   Specimen Description      ABDOMEN Performed at [facility] [address], [place], [place] [address]    Special Requests      NONE Performed at [facility], [place] [address] [place], [place] [address]    Gram Stain      NO SQUAMOUS EPITHELIAL CELLS SEEN FEW WBC SEEN NO ORGANISMS SEEN Performed at [facility], [place] [address] [place], [place] [address]     Culture PENDING    Report Status PENDING    Imaging Studies: DG Chest 2 View  Result Date: 03/16/2021 CLINICAL DATA:  Sepsis EXAM: CHEST - 2 VIEW COMPARISON:  03/22/2021 FINDINGS: Chronic, extensive bilateral lower lung zone predominant peripheral reticular infiltrate is again seen and appears stable in keeping with underlying pulmonary fibrosis, better appreciated on CT examination of 03/26/2021. No superimposed focal pulmonary infiltrate. No pneumothorax or pleural effusion. Cardiac size within normal limits. No acute bone abnormality. IMPRESSION: Chronic infiltrate in keeping with pulmonary fibrosis. No superimposed acute cardiopulmonary disease. Electronically Signed   By: Fidela Salisbury M.D.   On: 03/18/2021 23:14   CT HEAD WO CONTRAST (5MM)  Result Date: 04/07/2021 CLINICAL DATA:  Encephalopathy EXAM: CT HEAD WITHOUT CONTRAST TECHNIQUE: Contiguous axial images were obtained from the base of the skull through the vertex without intravenous contrast. COMPARISON:  03/22/2021 FINDINGS: Brain: There is no mass,  hemorrhage or extra-axial collection. There is generalized atrophy without lobar predilection. Hypodensity of the white matter is most commonly associated with chronic microvascular disease. Vascular: No abnormal hyperdensity of the major intracranial arteries or dural venous sinuses. No intracranial atherosclerosis. Skull: The visualized skull base, calvarium and extracranial soft tissues are normal. Sinuses/Orbits: No fluid levels or advanced mucosal thickening of the visualized paranasal sinuses. No mastoid or middle ear effusion. The orbits are normal. IMPRESSION: Generalized atrophy and chronic microvascular ischemia without acute intracranial abnormality. Electronically Signed   By: Ulyses Jarred M.D.   On: 04/07/2021 00:34   CT ABDOMEN PELVIS W CONTRAST  Result Date: 04/07/2021 CLINICAL DATA:  Diffuse abdominal pain, gastrostomy tube not in place, chronic nonhealing wound EXAM: CT  ABDOMEN AND PELVIS WITH CONTRAST TECHNIQUE: Multidetector CT imaging of the abdomen and pelvis was performed using the standard protocol following bolus administration of intravenous contrast. CONTRAST:  42mL OMNIPAQUE IOHEXOL 350 MG/ML SOLN COMPARISON:  10/24/2020 FINDINGS: Lower chest: Subpleural reticulation/fibrosis in the visualized lungs, suggesting chronic interstitial lung disease, grossly unchanged. Hepatobiliary: Liver is within normal limits. Distended gallbladder, chronic. No intrahepatic ductal dilatation. Dilated common duct, measuring 16 mm (coronal image 38), chronic. Pancreas: Within normal limits. Spleen: Within normal limits. Adrenals/Urinary Tract: Adrenal glands are within normal limits. Kidneys are within normal limits.  No hydronephrosis. Bladder is within normal limits. Stomach/Bowel: Stomach is notable for a percutaneous gastrostomy tract along the left anterior abdominal wall (series 2/image 41) without indwelling gastrostomy tube. No evidence of bowel obstruction. Appendix is not discretely visualized. Scattered left colonic diverticulosis, without evidence of diverticulitis. Moderate left colonic stool burden, suggesting constipation. Moderate to large rectal stool burden (series 2/image 68), suggesting fecal impaction. Vascular/Lymphatic: No evidence of abdominal aortic aneurysm. Atherosclerotic calcifications of the abdominal aorta and branch vessels. No suspicious abdominopelvic lymphadenopathy. Reproductive: Status post hysterectomy. No adnexal masses. Other: No abdominopelvic ascites. Musculoskeletal: Mild soft tissue thickening with a subcutaneous tract in the left medial gluteal region (series 2/image 70), extending to the coccyx, suggesting a sacral decubitus ulcer with overlying chronic osteomyelitis (sagittal image 82). Mild degenerative changes of the lower lumbar spine. IMPRESSION: Sacral decubitus ulcer with overlying chronic osteomyelitis involving the coccyx. Constipation  with moderate rectal fecal impaction. Percutaneous gastrostomy tract along the left mid abdominal wall, without visualization of the gastrostomy tube. Chronic gallbladder distension with dilated common duct. No intrahepatic ductal dilatation. Additional stable ancillary findings as above. Electronically Signed   By: Julian Hy M.D.   On: 04/07/2021 01:05    ED COURSE and MDM  Nursing notes, initial and subsequent vitals signs, including pulse oximetry, reviewed and interpreted by myself.  Vitals:   04/07/21 0241 04/07/21 0300 04/07/21 0330 04/07/21 0400  BP:  102/68 101/66 119/77  Pulse: 96 95 98 (!) 110  Resp: 18 (!) 27 20 18   Temp:      TempSrc:      SpO2: 100% 100% 100% 97%  Height:       Medications  0.9 %  sodium chloride infusion ( Intravenous New Bag/Given 03/15/2021 2334)  lactated ringers bolus 1,000 mL (0 mLs Intravenous Stopped 04/07/21 0302)  iohexol (OMNIPAQUE) 350 MG/ML injection 80 mL (80 mLs Intravenous Contrast Given 04/07/21 0028)   The patient's sister-in-law states the abdominal dermatitis is due to gastric contents leaking around her oersted well gastrostomy tube.  The gastrostomy tube has been deliberately removed but the dermatitis remains and has been refractory to treatment (typically with barrier creams).  The nonhealing surgical wound is  due to an exploratory laparotomy for bowel obstruction requiring bowel detorsion 08/23/2020; it was at this time a [place]amm gastrostomy tube was initially placed.  5:26 AM Dr. Tonie Griffith to admit to hospitalist service.  PROCEDURES  Procedures CRITICAL CARE Performed by: Karen Chafe Benjy Kana Total critical care time: 45 minutes Critical care time was exclusive of separately billable procedures and treating other patients. Critical care was necessary to treat or prevent imminent or life-threatening deterioration. Critical care was time spent personally by me on the following activities: development of treatment plan with patient and/or  surrogate as well as nursing, discussions with consultants, evaluation of patient's response to treatment, examination of patient, obtaining history from patient or surrogate, ordering and performing treatments and interventions, ordering and review of laboratory studies, ordering and review of radiographic studies, pulse oximetry and re-evaluation of patient's condition.   ED DIAGNOSES     ICD-10-CM   1. Delirium  R41.0     2. Nonhealing surgical wound, initial encounter  T81.89XA     3. Chronic dermatitis  L30.9     4. Fecal impaction in rectum (HCC)  K56.41     5. Pressure injury of sacral region, unstageable ([place])  L89.150          Coleen Cardiff, Jenny Reichmann, MD 04/07/21 [phone]

## 2021-04-06 NOTE — ED Notes (Signed)
To X-ray

## 2021-04-06 NOTE — ED Triage Notes (Signed)
Pt came from [facility] via EMS. C/c: AMS since 1800. Staff reports pt woke up from nap and only alert to self upon waking. Pt normally A&O x 4 at baseline. Pt had G tube placed in 08/22, wound is still not healed. PMH of dysphagia. EMS does not report urinary sx at this time. CBG: 114 28-32 RR 107/72 100 % on 2L, pt is always on O2 ETCO2: 32

## 2021-04-07 ENCOUNTER — Encounter (HOSPITAL_COMMUNITY): Payer: Self-pay | Admitting: Family Medicine

## 2021-04-07 ENCOUNTER — Emergency Department (HOSPITAL_COMMUNITY): Payer: Medicare Other

## 2021-04-07 ENCOUNTER — Other Ambulatory Visit (HOSPITAL_COMMUNITY): Payer: TRICARE For Life (TFL)

## 2021-04-07 DIAGNOSIS — R4182 Altered mental status, unspecified: Secondary | ICD-10-CM | POA: Diagnosis not present

## 2021-04-07 DIAGNOSIS — K5641 Fecal impaction: Secondary | ICD-10-CM

## 2021-04-07 DIAGNOSIS — R531 Weakness: Secondary | ICD-10-CM | POA: Diagnosis not present

## 2021-04-07 DIAGNOSIS — L89150 Pressure ulcer of sacral region, unstageable: Secondary | ICD-10-CM | POA: Diagnosis not present

## 2021-04-07 DIAGNOSIS — G9340 Encephalopathy, unspecified: Secondary | ICD-10-CM | POA: Diagnosis not present

## 2021-04-07 DIAGNOSIS — Z681 Body mass index (BMI) 19 or less, adult: Secondary | ICD-10-CM | POA: Diagnosis not present

## 2021-04-07 DIAGNOSIS — S31109D Unspecified open wound of abdominal wall, unspecified quadrant without penetration into peritoneal cavity, subsequent encounter: Secondary | ICD-10-CM | POA: Diagnosis not present

## 2021-04-07 DIAGNOSIS — I2510 Atherosclerotic heart disease of native coronary artery without angina pectoris: Secondary | ICD-10-CM | POA: Diagnosis present

## 2021-04-07 DIAGNOSIS — Z515 Encounter for palliative care: Secondary | ICD-10-CM | POA: Diagnosis not present

## 2021-04-07 DIAGNOSIS — M069 Rheumatoid arthritis, unspecified: Secondary | ICD-10-CM | POA: Diagnosis present

## 2021-04-07 DIAGNOSIS — Z86711 Personal history of pulmonary embolism: Secondary | ICD-10-CM | POA: Diagnosis not present

## 2021-04-07 DIAGNOSIS — R400 Somnolence: Secondary | ICD-10-CM

## 2021-04-07 DIAGNOSIS — Z86718 Personal history of other venous thrombosis and embolism: Secondary | ICD-10-CM | POA: Diagnosis not present

## 2021-04-07 DIAGNOSIS — D72829 Elevated white blood cell count, unspecified: Secondary | ICD-10-CM | POA: Diagnosis not present

## 2021-04-07 DIAGNOSIS — K56609 Unspecified intestinal obstruction, unspecified as to partial versus complete obstruction: Secondary | ICD-10-CM | POA: Diagnosis not present

## 2021-04-07 DIAGNOSIS — E785 Hyperlipidemia, unspecified: Secondary | ICD-10-CM | POA: Diagnosis present

## 2021-04-07 DIAGNOSIS — L89153 Pressure ulcer of sacral region, stage 3: Secondary | ICD-10-CM | POA: Diagnosis present

## 2021-04-07 DIAGNOSIS — R109 Unspecified abdominal pain: Secondary | ICD-10-CM | POA: Diagnosis not present

## 2021-04-07 DIAGNOSIS — G319 Degenerative disease of nervous system, unspecified: Secondary | ICD-10-CM | POA: Diagnosis not present

## 2021-04-07 DIAGNOSIS — E43 Unspecified severe protein-calorie malnutrition: Secondary | ICD-10-CM | POA: Diagnosis present

## 2021-04-07 DIAGNOSIS — M810 Age-related osteoporosis without current pathological fracture: Secondary | ICD-10-CM | POA: Diagnosis present

## 2021-04-07 DIAGNOSIS — R410 Disorientation, unspecified: Secondary | ICD-10-CM | POA: Diagnosis not present

## 2021-04-07 DIAGNOSIS — L89152 Pressure ulcer of sacral region, stage 2: Secondary | ICD-10-CM | POA: Diagnosis not present

## 2021-04-07 DIAGNOSIS — G9341 Metabolic encephalopathy: Secondary | ICD-10-CM | POA: Diagnosis present

## 2021-04-07 DIAGNOSIS — F424 Excoriation (skin-picking) disorder: Secondary | ICD-10-CM | POA: Diagnosis not present

## 2021-04-07 DIAGNOSIS — I5032 Chronic diastolic (congestive) heart failure: Secondary | ICD-10-CM | POA: Diagnosis present

## 2021-04-07 DIAGNOSIS — Z20822 Contact with and (suspected) exposure to covid-19: Secondary | ICD-10-CM | POA: Diagnosis present

## 2021-04-07 DIAGNOSIS — K316 Fistula of stomach and duodenum: Secondary | ICD-10-CM | POA: Diagnosis present

## 2021-04-07 DIAGNOSIS — E860 Dehydration: Secondary | ICD-10-CM | POA: Diagnosis present

## 2021-04-07 DIAGNOSIS — Z66 Do not resuscitate: Secondary | ICD-10-CM | POA: Diagnosis present

## 2021-04-07 DIAGNOSIS — I6782 Cerebral ischemia: Secondary | ICD-10-CM | POA: Diagnosis not present

## 2021-04-07 DIAGNOSIS — K9423 Gastrostomy malfunction: Secondary | ICD-10-CM | POA: Diagnosis not present

## 2021-04-07 DIAGNOSIS — L089 Local infection of the skin and subcutaneous tissue, unspecified: Secondary | ICD-10-CM | POA: Diagnosis not present

## 2021-04-07 DIAGNOSIS — E876 Hypokalemia: Secondary | ICD-10-CM | POA: Diagnosis not present

## 2021-04-07 DIAGNOSIS — L309 Dermatitis, unspecified: Secondary | ICD-10-CM | POA: Diagnosis present

## 2021-04-07 DIAGNOSIS — M4628 Osteomyelitis of vertebra, sacral and sacrococcygeal region: Secondary | ICD-10-CM | POA: Diagnosis present

## 2021-04-07 DIAGNOSIS — M8668 Other chronic osteomyelitis, other site: Secondary | ICD-10-CM

## 2021-04-07 DIAGNOSIS — R401 Stupor: Secondary | ICD-10-CM | POA: Diagnosis not present

## 2021-04-07 DIAGNOSIS — J8410 Pulmonary fibrosis, unspecified: Secondary | ICD-10-CM | POA: Diagnosis present

## 2021-04-07 DIAGNOSIS — J9611 Chronic respiratory failure with hypoxia: Secondary | ICD-10-CM | POA: Diagnosis present

## 2021-04-07 DIAGNOSIS — J849 Interstitial pulmonary disease, unspecified: Secondary | ICD-10-CM | POA: Diagnosis not present

## 2021-04-07 DIAGNOSIS — Z96653 Presence of artificial knee joint, bilateral: Secondary | ICD-10-CM | POA: Diagnosis present

## 2021-04-07 DIAGNOSIS — E162 Hypoglycemia, unspecified: Secondary | ICD-10-CM | POA: Diagnosis not present

## 2021-04-07 DIAGNOSIS — T8189XA Other complications of procedures, not elsewhere classified, initial encounter: Secondary | ICD-10-CM | POA: Diagnosis present

## 2021-04-07 DIAGNOSIS — Y833 Surgical operation with formation of external stoma as the cause of abnormal reaction of the patient, or of later complication, without mention of misadventure at the time of the procedure: Secondary | ICD-10-CM | POA: Diagnosis present

## 2021-04-07 DIAGNOSIS — Z7189 Other specified counseling: Secondary | ICD-10-CM | POA: Diagnosis not present

## 2021-04-07 DIAGNOSIS — B961 Klebsiella pneumoniae [K. pneumoniae] as the cause of diseases classified elsewhere: Secondary | ICD-10-CM | POA: Diagnosis not present

## 2021-04-07 DIAGNOSIS — S31109A Unspecified open wound of abdominal wall, unspecified quadrant without penetration into peritoneal cavity, initial encounter: Secondary | ICD-10-CM | POA: Diagnosis not present

## 2021-04-07 DIAGNOSIS — D849 Immunodeficiency, unspecified: Secondary | ICD-10-CM | POA: Diagnosis present

## 2021-04-07 LAB — ABO/RH: ABO/RH(D): O POS

## 2021-04-07 MED ORDER — PANCRELIPASE (LIP-PROT-AMYL) 12000-38000 UNITS PO CPEP
36000.0000 [IU] | ORAL_CAPSULE | Freq: Three times a day (TID) | ORAL | Status: DC
  Administered 2021-04-07 – 2021-04-11 (×12): 36000 [IU] via ORAL
  Filled 2021-04-07: qty 1
  Filled 2021-04-07 (×7): qty 3
  Filled 2021-04-07: qty 1
  Filled 2021-04-07 (×2): qty 3
  Filled 2021-04-07: qty 1
  Filled 2021-04-07 (×3): qty 3

## 2021-04-07 MED ORDER — OXYCODONE HCL 5 MG PO TABS
5.0000 mg | ORAL_TABLET | ORAL | Status: DC | PRN
  Administered 2021-04-07 – 2021-04-15 (×7): 5 mg via ORAL
  Filled 2021-04-07 (×9): qty 1

## 2021-04-07 MED ORDER — HYDROMORPHONE HCL 1 MG/ML IJ SOLN
0.5000 mg | INTRAMUSCULAR | Status: AC | PRN
  Administered 2021-04-08 – 2021-04-09 (×2): 0.5 mg via INTRAVENOUS
  Filled 2021-04-07 (×2): qty 0.5

## 2021-04-07 MED ORDER — DOCUSATE SODIUM 100 MG PO CAPS
100.0000 mg | ORAL_CAPSULE | Freq: Every day | ORAL | Status: DC
  Administered 2021-04-07 – 2021-04-10 (×4): 100 mg via ORAL
  Filled 2021-04-07 (×4): qty 1

## 2021-04-07 MED ORDER — ENOXAPARIN SODIUM 30 MG/0.3ML IJ SOSY
30.0000 mg | PREFILLED_SYRINGE | INTRAMUSCULAR | Status: DC
Start: 2021-04-07 — End: 2021-04-11
  Administered 2021-04-11: 30 mg via SUBCUTANEOUS
  Filled 2021-04-07 (×2): qty 0.3

## 2021-04-07 MED ORDER — ACETAMINOPHEN 650 MG RE SUPP: 650.0000 mg | Freq: Four times a day (QID) | RECTAL | Status: DC | PRN

## 2021-04-07 MED ORDER — MIRTAZAPINE 15 MG PO TABS
15.0000 mg | ORAL_TABLET | Freq: Every day | ORAL | Status: DC
  Administered 2021-04-07 – 2021-04-16 (×9): 15 mg via ORAL
  Filled 2021-04-07 (×11): qty 1

## 2021-04-07 MED ORDER — VANCOMYCIN HCL 500 MG/100ML IV SOLN
500.0000 mg | INTRAVENOUS | Status: DC
  Filled 2021-04-07: qty 100

## 2021-04-07 MED ORDER — MIDODRINE HCL 5 MG PO TABS
5.0000 mg | ORAL_TABLET | Freq: Three times a day (TID) | ORAL | Status: DC
  Administered 2021-04-07 – 2021-04-11 (×13): 5 mg via ORAL
  Filled 2021-04-07 (×16): qty 1

## 2021-04-07 MED ORDER — LACTATED RINGERS IV SOLN: INTRAVENOUS | Status: DC

## 2021-04-07 MED ORDER — ATORVASTATIN CALCIUM 10 MG PO TABS
10.0000 mg | ORAL_TABLET | Freq: Every day | ORAL | Status: DC
  Administered 2021-04-07 – 2021-04-11 (×5): 10 mg via ORAL
  Filled 2021-04-07 (×5): qty 1

## 2021-04-07 MED ORDER — SODIUM CHLORIDE 0.9 % IV SOLN
1.0000 g | Freq: Three times a day (TID) | INTRAVENOUS | Status: DC
  Administered 2021-04-07 – 2021-04-08 (×3): 1 g via INTRAVENOUS
  Filled 2021-04-07 (×4): qty 1

## 2021-04-07 MED ORDER — ACETAMINOPHEN 325 MG PO TABS
650.0000 mg | ORAL_TABLET | Freq: Four times a day (QID) | ORAL | Status: DC | PRN
  Administered 2021-04-07 – 2021-04-12 (×7): 650 mg via ORAL
  Filled 2021-04-07 (×10): qty 2

## 2021-04-07 MED ORDER — ONDANSETRON HCL 4 MG PO TABS: 4.0000 mg | ORAL_TABLET | Freq: Four times a day (QID) | ORAL | Status: DC | PRN

## 2021-04-07 MED ORDER — IOHEXOL 350 MG/ML SOLN
80.0000 mL | Freq: Once | INTRAVENOUS | Status: AC | PRN
  Administered 2021-04-07: 80 mL via INTRAVENOUS

## 2021-04-07 MED ORDER — FAMOTIDINE 20 MG PO TABS
20.0000 mg | ORAL_TABLET | Freq: Every day | ORAL | Status: DC
  Administered 2021-04-07 – 2021-04-14 (×6): 20 mg via ORAL
  Filled 2021-04-07 (×8): qty 1

## 2021-04-07 MED ORDER — VANCOMYCIN HCL IN DEXTROSE 1-5 GM/200ML-% IV SOLN
1000.0000 mg | Freq: Once | INTRAVENOUS | Status: AC
  Administered 2021-04-07: 1000 mg via INTRAVENOUS
  Filled 2021-04-07: qty 200

## 2021-04-07 MED ORDER — ONDANSETRON HCL 4 MG/2ML IJ SOLN
4.0000 mg | Freq: Four times a day (QID) | INTRAMUSCULAR | Status: DC | PRN
  Administered 2021-04-07: 4 mg via INTRAVENOUS
  Filled 2021-04-07: qty 2

## 2021-04-07 NOTE — ED Notes (Signed)
Patient was incontinent of stool. Stool is loose. Patient has reddness/ rash area on her sacral area. Sacral wound noted aswell.

## 2021-04-07 NOTE — H&P (Signed)
History and Physical    [name] [name] URK:[id number] DOB: 09-21-1930 DOA: 03/12/2021  PCP: Hoyt Koch, MD   Patient coming from: SNF  Chief Complaint: Altered mental status, chronic wound on abdomen that is not healing  HPI: [name] [name] is a 87 y.o. female with medical history significant for CHF, chronic nonhealing abdominal wound, hx of DVT/PE, CAD, RA who presents for evaluation of altered mental status.  Patient had abdominal surgery a few months ago and at that time had a gastrostomy tube placed that was removed in August.  She has had a chronic nonhealing wound over her abdomen since then with surrounding dermatitis.  She has been in a nursing facility for the last few months and found member at bedside reports she is normally alert and oriented to person place and time.  Yesterday she was sleeping more than normal and was hard to arouse from a nap and she was only alert to herself at that point.  Her speech was incomprehensible intermittently.  She is not able to give any history at this time.  She was found to mildly tachycardic with a temperature of 98.9 degrees when she arrived in the emergency room.  She was not deemed by the emergency room to have sepsis and was not placed on sepsis protocol.  During work-up she was found to have a sacral decubitus ulcer with chronic osteomyelitis on CT scan.  She also has fecal impaction has had small amounts of liquid stool leaking around the fecal impaction.  She does complain of some abdominal pain when palpated.  Dressings were recently changed in the emergency room  ED Course: Tmax 9.9 degrees pulse 95-1 10, respiratory rate 14-26, blood pressure 102-137/68-85.  CT scan reveals sacral decubitus ulcer with chronic underlying osteomyelitis.  She also has fecal impaction.  CT scan of the head is negative for any acute intracranial pathology. lactic acid 1.5 WBC 16,000 hemoglobin 12.6 hematocrit 41.3 platelets 328,000 sodium 146 potassium 4.0  chloride 104 bicarb 33 creatinine 0.73 BUN 15 glucose 106 alkaline phosphatase 120 AST 16 ALT 10 albumin 2.5, INR 1.1, urinalysis negative, COVID 19 influenza a and B are all negative.  Hospitalist service been asked to admit for further management  Review of Systems:  Unable to obtain review of systems secondary to altered mental status  Past Medical History:  Diagnosis Date   Allergic rhinitis    Allergy    CAD (coronary artery disease)    Mild CAD by cath 2008   CHF (congestive heart failure) (HCC)    DJD (degenerative joint disease)    rheumatoid   DVT (deep venous thrombosis) ([place])    Dyslipidemia    History of echocardiogram    Echo 12/2018: EF 60-65, mod asymmetric LVH, Gr 1 DD   History of nuclear stress test    Myoview 5/17:  EF 74%, normal perfusion, low risk // Myoview 12/2018:  EF 89, very mild ischemia in inferoapical wall; Low Risk   History of pulmonary embolism    Hyperlipidemia    Insomnia    Osteoporosis    Psoriasis    Pulmonary fibrosis ([place])    Rheumatoid arthritis ([place])     Past Surgical History:  Procedure Laterality Date   ABDOMINAL HYSTERECTOMY  1974   APPENDECTOMY  1959   CATARACT EXTRACTION     IR [place] TUBE CHANGE  11/05/2020   IR [place] GASTRO/COLONIC TUBE PERCUT W/FLUORO  10/29/2020   IR [place] GASTRO/COLONIC TUBE PERCUT W/FLUORO  11/17/2020  IR [place] TUBE PERCUT W/FLUORO  01/15/2021   LAPAROTOMY N/A 08/23/2020   Procedure: EXPLORATORY LAPAROTOMY small bowel resection gastrostomy tube placement;  Surgeon: Dwan Bolt, MD;  Location: [facility] ORS;  Service: General;  Laterality: N/A;   LEFT HEART CATHETERIZATION WITH CORONARY ANGIOGRAM N/A 06/18/2013   Procedure: LEFT HEART CATHETERIZATION WITH CORONARY ANGIOGRAM;  Surgeon: Peter M Martinique, MD;  Location: [facility] LAB;  Service: Cardiovascular;  Laterality: N/A;   TONSILLECTOMY  1941, [place], 2007   Bilateral    Social History  reports that she quit smoking about  47 years ago. Her smoking use included cigarettes. She has a 10.00 pack-year smoking history. She has never used smokeless tobacco. She reports that she does not drink alcohol and does not use drugs.  Allergies  Allergen Reactions   Crestor [Rosuvastatin] Other (See Comments)    Muscle pain   Lactose Intolerance (Gi) Other (See Comments)    Stomach upset   Penicillins Hives   Imuran [Azathioprine] Nausea And Vomiting    Family History  Problem Relation Age of Onset   Kidney disease Daughter 81   Cancer Daughter    Heart attack Father 61   Alzheimer's disease Sister    Heart disease Sister    Alzheimer's disease Sister      Prior to Admission medications   Medication Sig Start Date End Date Taking? Authorizing Provider  acetaminophen (TYLENOL) 500 MG tablet Take 1,000 mg by mouth in the morning and at bedtime.   Yes [provider]  Amino Acids-Protein Hydrolys (PRO-STAT AWC) LIQD Take 30 mLs by mouth daily.   Yes [provider]  atorvastatin (LIPITOR) 20 MG tablet Take 1 tablet (20 mg total) by mouth daily. Patient taking differently: Take 10 mg by mouth daily. 01/08/21 07/07/21 Yes Loel Dubonnet, NP  Docusate Sodium 100 MG capsule Take 100 mg by mouth daily.   Yes [provider]  famotidine (PEPCID) 20 MG tablet Take 20 mg by mouth daily.   Yes [provider]  gabapentin (NEURONTIN) 300 MG capsule Take 1 capsule (300 mg total) by mouth 3 (three) times daily. 11/23/20  Yes Angiulli, Lavon Paganini, PA-C  lipase/protease/amylase (CREON) 36000 UNITS CPEP capsule Take 1 capsule (36,000 Units total) by mouth 3 (three) times daily before meals. 10/09/20  Yes Hoyt Koch, MD  midodrine (PROAMATINE) 5 MG tablet Take 1 tablet (5 mg total) by mouth 3 (three) times daily with meals. 11/23/20  Yes Angiulli, Lavon Paganini, PA-C  mirtazapine (REMERON) 30 MG tablet Take 15 mg by mouth at bedtime.   Yes [provider]  Multiple Vitamin (MULTIVITAMIN  WITH MINERALS) TABS tablet Take 1 tablet by mouth daily. 11/05/20  Yes Mercy Riding, MD  Nutritional Supplements (ENSURE ORIGINAL PO) Take 237 mLs by mouth 4 (four) times daily.   Yes [provider]  oxyCODONE (OXY IR/ROXICODONE) 5 MG immediate release tablet Take 1 tablet (5 mg total) by mouth every 4 (four) hours as needed for severe pain. 11/23/20  Yes Angiulli, Lavon Paganini, PA-C  predniSONE (DELTASONE) 5 MG tablet TAKE 1 TABLET DAILY WITH BREAKFAST Patient taking differently: Take 5 mg by mouth daily with breakfast. 05/08/20  Yes Mannam, Praveen, MD  Dextromethorphan-guaiFENesin (ROBITUSSIN DM PO) Take 10 mLs by mouth every 6 (six) hours as needed (cough/congestion).    [provider]  guaiFENesin (ROBITUSSIN) 100 MG/5ML liquid Take 100 mg by mouth every 8 (eight) hours as needed for  cough.    [provider]  mycophenolate (CELLCEPT) 500 MG tablet TAKE 2 TABLETS BY MOUTH TWICE DAILY Patient not taking: No sig reported 01/18/21   Mannam, Hart Robinsons, MD  nitroGLYCERIN (NITROSTAT) 0.3 MG SL tablet Place 0.3 mg under the tongue every 5 (five) minutes x 3 doses as needed for chest pain.    [provider]    Physical Exam: Vitals:   04/07/21 0330 04/07/21 0400 04/07/21 0450 04/07/21 0500  BP: 101/66 119/77 118/71 137/79  Pulse: 98 (!) 110  (!) 117  Resp: 20 18 14  (!) 26  Temp:      TempSrc:      SpO2: 100% 97% 100% 100%  Height:        Constitutional: NAD, calm Vitals:   04/07/21 0330 04/07/21 0400 04/07/21 0450 04/07/21 0500  BP: 101/66 119/77 118/71 137/79  Pulse: 98 (!) 110  (!) 117  Resp: 20 18 14  (!) 26  Temp:      TempSrc:      SpO2: 100% 97% 100% 100%  Height:       General: WDWN, Alert and oriented to person. Elderly frail female Eyes: PERRL, conjunctivae normal.  Sclera nonicteric HENT:  [place]/AT, temporal wasting. external ears normal.  Nares patent without epistasis.  Mucous membranes are dry Neck: Soft, normal range of motion, supple, no  masses, Trachea midline Respiratory: clear to auscultation bilaterally, no wheezing, no crackles. Normal respiratory effort. No accessory muscle use.  Cardiovascular: Regular rate and rhythm, no murmurs / rubs / gallops. No extremity edema. 1+ pedal pulses. Abdomen: Soft, has diffuse tenderness, nondistended, no rebound or guarding.  No masses palpated. Bowel sounds normoactive Musculoskeletal: FROM. Thin extremities. no cyanosis. No joint deformity upper and lower extremities.Normal muscle tone.  Skin: Warm, dry, intact no rashes, lesions, ulcers. No induration Neurologic: No tremor.  Normal speech.  Sensation intact, moves all 4 extremity spontaneously Psychiatric:  Normal mood and affect.    Labs on Admission: I have personally reviewed following labs and imaging studies  CBC: Recent Labs  Lab 03/21/2021 2216  WBC 16.0*  NEUTROABS 9.7*  HGB 12.6  HCT 41.3  MCV 95.6  PLT 734    Basic Metabolic Panel: Recent Labs  Lab 04/02/2021 2216  NA 146*  K 4.0  CL 104  CO2 33*  GLUCOSE 106*  BUN 15  CREATININE 0.73  CALCIUM 9.6    GFR: CrCl cannot be calculated (Unknown ideal weight.).  Liver Function Tests: Recent Labs  Lab 04/01/2021 2216  AST 16  ALT 10  ALKPHOS 120  BILITOT 0.7  PROT 7.7  ALBUMIN 2.5*    Urine analysis:    Component Value Date/Time   COLORURINE YELLOW 03/15/2021 2216   APPEARANCEUR CLEAR 03/31/2021 2216   LABSPEC 1.010 03/18/2021 2216   PHURINE 7.0 03/23/2021 2216   GLUCOSEU NEGATIVE 03/21/2021 2216   GLUCOSEU NEGATIVE 01/03/2020 1434   HGBUR TRACE (A) 03/11/2021 2216   [place] 04/05/2021 2216   BILIRUBINUR N 01/24/2014 1354   KETONESUR NEGATIVE 04/03/2021 2216   PROTEINUR TRACE (A) 03/16/2021 2216   UROBILINOGEN 4.0 (A) 01/03/2020 1434   NITRITE NEGATIVE 03/15/2021 2216   LEUKOCYTESUR NEGATIVE 03/12/2021 2216    Radiological Exams on Admission: DG Chest 2 View  Result Date: 04/07/2021 CLINICAL DATA:  Sepsis EXAM: CHEST - 2  VIEW COMPARISON:  03/22/2021 FINDINGS: Chronic, extensive bilateral lower lung zone predominant peripheral reticular infiltrate is again seen and appears stable in keeping with underlying pulmonary fibrosis, better appreciated on  CT examination of 03/26/2021. No superimposed focal pulmonary infiltrate. No pneumothorax or pleural effusion. Cardiac size within normal limits. No acute bone abnormality. IMPRESSION: Chronic infiltrate in keeping with pulmonary fibrosis. No superimposed acute cardiopulmonary disease. Electronically Signed   By: Fidela Salisbury M.D.   On: 03/14/2021 23:14   CT HEAD WO CONTRAST (5MM)  Result Date: 04/07/2021 CLINICAL DATA:  Encephalopathy EXAM: CT HEAD WITHOUT CONTRAST TECHNIQUE: Contiguous axial images were obtained from the base of the skull through the vertex without intravenous contrast. COMPARISON:  03/22/2021 FINDINGS: Brain: There is no mass, hemorrhage or extra-axial collection. There is generalized atrophy without lobar predilection. Hypodensity of the white matter is most commonly associated with chronic microvascular disease. Vascular: No abnormal hyperdensity of the major intracranial arteries or dural venous sinuses. No intracranial atherosclerosis. Skull: The visualized skull base, calvarium and extracranial soft tissues are normal. Sinuses/Orbits: No fluid levels or advanced mucosal thickening of the visualized paranasal sinuses. No mastoid or middle ear effusion. The orbits are normal. IMPRESSION: Generalized atrophy and chronic microvascular ischemia without acute intracranial abnormality. Electronically Signed   By: Ulyses Jarred M.D.   On: 04/07/2021 00:34   CT ABDOMEN PELVIS W CONTRAST  Result Date: 04/07/2021 CLINICAL DATA:  Diffuse abdominal pain, gastrostomy tube not in place, chronic nonhealing wound EXAM: CT ABDOMEN AND PELVIS WITH CONTRAST TECHNIQUE: Multidetector CT imaging of the abdomen and pelvis was performed using the standard protocol following bolus  administration of intravenous contrast. CONTRAST:  103mL OMNIPAQUE IOHEXOL 350 MG/ML SOLN COMPARISON:  10/24/2020 FINDINGS: Lower chest: Subpleural reticulation/fibrosis in the visualized lungs, suggesting chronic interstitial lung disease, grossly unchanged. Hepatobiliary: Liver is within normal limits. Distended gallbladder, chronic. No intrahepatic ductal dilatation. Dilated common duct, measuring 16 mm (coronal image 38), chronic. Pancreas: Within normal limits. Spleen: Within normal limits. Adrenals/Urinary Tract: Adrenal glands are within normal limits. Kidneys are within normal limits.  No hydronephrosis. Bladder is within normal limits. Stomach/Bowel: Stomach is notable for a percutaneous gastrostomy tract along the left anterior abdominal wall (series 2/image 41) without indwelling gastrostomy tube. No evidence of bowel obstruction. Appendix is not discretely visualized. Scattered left colonic diverticulosis, without evidence of diverticulitis. Moderate left colonic stool burden, suggesting constipation. Moderate to large rectal stool burden (series 2/image 68), suggesting fecal impaction. Vascular/Lymphatic: No evidence of abdominal aortic aneurysm. Atherosclerotic calcifications of the abdominal aorta and branch vessels. No suspicious abdominopelvic lymphadenopathy. Reproductive: Status post hysterectomy. No adnexal masses. Other: No abdominopelvic ascites. Musculoskeletal: Mild soft tissue thickening with a subcutaneous tract in the left medial gluteal region (series 2/image 70), extending to the coccyx, suggesting a sacral decubitus ulcer with overlying chronic osteomyelitis (sagittal image 82). Mild degenerative changes of the lower lumbar spine. IMPRESSION: Sacral decubitus ulcer with overlying chronic osteomyelitis involving the coccyx. Constipation with moderate rectal fecal impaction. Percutaneous gastrostomy tract along the left mid abdominal wall, without visualization of the gastrostomy tube.  Chronic gallbladder distension with dilated common duct. No intrahepatic ductal dilatation. Additional stable ancillary findings as above. Electronically Signed   By: Julian Hy M.D.   On: 04/07/2021 01:05    EKG: Independently reviewed.  EKG shows normal sinus rhythm with no acute ST elevation or depression.  QTc 423  Assessment/Plan Principal Problem:   AMS (altered mental status) Ms. Soule is admitted to [place] floor with altered mental status.  She has been more confused according to the sister-in-law at bedside over the last few days.  Most likely is related to infection with osteomyelitis as well as malnutrition and  chronic nonhealing wound on her abdomen. Monitor electrolytes and renal function with labs in morning Fall precautions.  Active Problems:   Chronic osteomyelitis of sacrum  Started on vancomycin for antibiotic coverage. Consult wound care    Fecal impaction  Soapsuds enema provided    Chronic wound infection of abdomen Consult wound care    Chronic diastolic CHF (congestive heart failure)  Chronic and stable.  No signs of fluid overload    Leukocytosis Elevated WBC to 16,000.  Started on vancomycin.  Monitor CBC  DVT prophylaxis: Lovenox  for DVT prophylaxis.  Code Status:   Full code  Family Communication:  Diagnosis and plan was discussed with patient's sister-in-law who is power of attorney and at bedside.  Verbalized understanding agrees with plan.  Further recommendation follow as clinical Disposition Plan:   Patient is from:  SNF  Anticipated DC to:  SNF  Anticipated DC date:  Anticipate 2 midnight or more stay in the hospital  Admission status:  Inpatient  Yevonne Aline Othella Slappey MD [facility]  How to contact the [facility] Attending or Consulting provider [place] or covering provider during after hours [place], for this patient?   Check the care team in [facility] and look for a) attending/consulting [facility] provider listed and b) the [facility] team listed Log  into [email or web address] and use [place]'s universal password to access. If you do not have the password, please contact the hospital operator. Locate the [facility] provider you are looking for under [facility] and page to a number that you can be directly reached. If you still have difficulty reaching the provider, please page the [facility] (Director on Call) for the Hospitalists listed on amion for assistance.  04/07/2021, 6:15 AM

## 2021-04-07 NOTE — ED Notes (Signed)
Attempted to call sister-in-law x3, no answer Spoke with nephew, family updated and given room number

## 2021-04-07 NOTE — Consult Note (Signed)
WOC Nurse Consult Note: Patient receiving care in [facility] ED 14. Primary RN, Levada Dy, present for wound care for the patient. Reason for Consult: sacral wound and abdominal wound Wound type: non-healing stage 4 PI to coccyx/sacral area; shallow--but full thickness--wound to RLQ of abdomen--surgical in nature Pressure Injury POA: Yes Measurement: Abdominal wound measures 6 cm x 5 cm x 0.2 cm and is 100% pink, no odor. Sacral/coccyx wound measures 0.5 cm x 0.5 cm x 0.5 cm with 3 cm of undermining at 12 o'clock. The wound bed cannot be visualized. The margins are macerated. There was no packing in the wound, only a small foam dressing over it.  The abdomen has been extensively burned by gastric contents oozing from the remaining hole in the LUQ of the abdomen.  See photos from 03/12/2021 for a visual on that. Wound bed: Drainage (amount, consistency, odor)  Periwound: Dressing procedure/placement/frequency: Clean around the leaking feeding tube insertion hole in the LUQ of the abdomen. Pat dry. Crust the raw irritated skin on the abdomen with Antifungal powder (green and white bottle in clean utility). To crust the raw area sprinkle antifungal powder over the raw skin, dab (do NOT rub) the powder with No Sting Barrier Film wipes (it will take more than one wipe). Gently insert a small piece of 1/4 inch Iodoform packing strip Kellie Simmering [phone]) into the leaking hole. Place a small piece of Xeroform gauze Kellie Simmering [phone]) over the shallow wound on the RLQ of the abdomen. Cover the entire area with one ABD pad. Tape ONLY the corners--do not put a lot of tape on the patient's skin.  This was done by me at the time of my assessment.  For the sacral wound, I placed a segment of 1/4 inch Iodoform packing strip into the wound, and covered it with a small foam dressing.  For the fungal overgrowth on the labia and perineum, I sprinkled antifungal powder on the area.    Also, you can see in the photo from today at 04:27 there  is an evolving DTPI to the left sacral area that measures roughly 4 cm x 3 cm.   To help reduce pressure, I am ordering a standard size bed with air mattress.  Monitor the wound area(s) for worsening of condition such as: Signs/symptoms of infection,  Increase in size,  Development of or worsening of odor, Development of pain, or increased pain at the affected locations.  Notify the medical team if any of these develop.  Thank you for the consult.  Discussed plan of care with the patient and bedside nurse.  [place] nurse will not follow at this time.  Please re-consult the [place] team if needed.  Val Riles, RN, MSN, CWOCN, CNS-BC, pager [phone]

## 2021-04-07 NOTE — Progress Notes (Signed)
Patient admitted early this morning, detail please see HPI.  She does appear lethargic but interactive.  Continue vanc , meropenem for now Case discussed with general surgery due to family concerns about draining of peg exit site and question regarding TPN Palliative care consulted as well  Sister-in-law at bedside updated, question answered.

## 2021-04-07 NOTE — Progress Notes (Signed)
Pharmacy Antibiotic Note  [name] [name] is a 87 y.o. female admitted on 04/03/2021 with  osteomyelitis .  Pharmacy has been consulted for Vancomycin dosing.  Plan: Vancomycin 500mg  IV q24h to target AUC 400-550 Check Vancomycin levels at steady state Monitor renal function and cx data   Height: 5\' 4"  (162.6 cm) IBW/kg (Calculated) : 54.7  Temp (24hrs), Avg:99.6 F (37.6 C), Min:99.2 F (37.3 C), Max:99.9 F (37.7 C)  Recent Labs  Lab 03/14/2021 2216  WBC 16.0*  CREATININE 0.73  LATICACIDVEN 1.5    CrCl cannot be calculated (Unknown ideal weight.).    Allergies  Allergen Reactions   Crestor [Rosuvastatin] Other (See Comments)    Muscle pain   Lactose Intolerance (Gi) Other (See Comments)    Stomach upset   Penicillins Hives   Imuran [Azathioprine] Nausea And Vomiting    Antimicrobials this admission: 9/28 Vancomycin >>   Dose adjustments this admission:  Microbiology results: 9/27 Wound cx abdomen: mod GPC 9/27 BCx:   Thank you for allowing pharmacy to be a part of this patient's care.  Netta Cedars PharmD 04/07/2021 6:50 AM

## 2021-04-07 NOTE — ED Notes (Signed)
Patient used bedpan to void.

## 2021-04-08 ENCOUNTER — Encounter (HOSPITAL_COMMUNITY): Payer: Self-pay | Admitting: Family Medicine

## 2021-04-08 DIAGNOSIS — L309 Dermatitis, unspecified: Secondary | ICD-10-CM

## 2021-04-08 DIAGNOSIS — M8668 Other chronic osteomyelitis, other site: Secondary | ICD-10-CM

## 2021-04-08 DIAGNOSIS — Z7189 Other specified counseling: Secondary | ICD-10-CM | POA: Diagnosis not present

## 2021-04-08 DIAGNOSIS — E162 Hypoglycemia, unspecified: Secondary | ICD-10-CM

## 2021-04-08 DIAGNOSIS — E876 Hypokalemia: Secondary | ICD-10-CM

## 2021-04-08 DIAGNOSIS — G9341 Metabolic encephalopathy: Secondary | ICD-10-CM | POA: Diagnosis not present

## 2021-04-08 DIAGNOSIS — Z515 Encounter for palliative care: Secondary | ICD-10-CM | POA: Diagnosis not present

## 2021-04-08 DIAGNOSIS — R410 Disorientation, unspecified: Secondary | ICD-10-CM | POA: Diagnosis not present

## 2021-04-08 DIAGNOSIS — S31109A Unspecified open wound of abdominal wall, unspecified quadrant without penetration into peritoneal cavity, initial encounter: Secondary | ICD-10-CM | POA: Diagnosis not present

## 2021-04-08 DIAGNOSIS — R531 Weakness: Secondary | ICD-10-CM

## 2021-04-08 DIAGNOSIS — L89150 Pressure ulcer of sacral region, unstageable: Secondary | ICD-10-CM | POA: Diagnosis not present

## 2021-04-08 DIAGNOSIS — T8189XA Other complications of procedures, not elsewhere classified, initial encounter: Principal | ICD-10-CM

## 2021-04-08 DIAGNOSIS — L089 Local infection of the skin and subcutaneous tissue, unspecified: Secondary | ICD-10-CM

## 2021-04-08 DIAGNOSIS — I5032 Chronic diastolic (congestive) heart failure: Secondary | ICD-10-CM

## 2021-04-08 DIAGNOSIS — D72829 Elevated white blood cell count, unspecified: Secondary | ICD-10-CM | POA: Diagnosis not present

## 2021-04-08 LAB — GLUCOSE, CAPILLARY
Glucose-Capillary: 51 mg/dL — ABNORMAL LOW (ref 70–99)
Glucose-Capillary: 126 mg/dL — ABNORMAL HIGH (ref 70–99)

## 2021-04-08 LAB — CBC
HCT: 30.1 % — ABNORMAL LOW (ref 36.0–46.0)
Hemoglobin: 9.5 g/dL — ABNORMAL LOW (ref 12.0–15.0)
MCH: 29.8 pg (ref 26.0–34.0)
MCHC: 31.6 g/dL (ref 30.0–36.0)
MCV: 94.4 fL (ref 80.0–100.0)
Platelets: 244 10*3/uL (ref 150–400)
RBC: 3.19 MIL/uL — ABNORMAL LOW (ref 3.87–5.11)
RDW: 14.6 % (ref 11.5–15.5)
WBC: 16.3 10*3/uL — ABNORMAL HIGH (ref 4.0–10.5)
nRBC: 0 % (ref 0.0–0.2)

## 2021-04-08 LAB — MAGNESIUM: Magnesium: 1.6 mg/dL — ABNORMAL LOW (ref 1.7–2.4)

## 2021-04-08 LAB — BASIC METABOLIC PANEL
Anion gap: 6 (ref 5–15)
BUN: 11 mg/dL (ref 8–23)
CO2: 29 mmol/L (ref 22–32)
Calcium: 8.2 mg/dL — ABNORMAL LOW (ref 8.9–10.3)
Chloride: 105 mmol/L (ref 98–111)
Creatinine, Ser: 0.48 mg/dL (ref 0.44–1.00)
GFR, Estimated: >60 mL/min (ref >60)
Glucose, Bld: 66 mg/dL — ABNORMAL LOW (ref 70–99)
Potassium: 3.2 mmol/L — ABNORMAL LOW (ref 3.5–5.1)
Sodium: 140 mmol/L (ref 135–145)

## 2021-04-08 MED ORDER — LACTATED RINGERS IV BOLUS
500.0000 mL | Freq: Once | INTRAVENOUS | Status: AC
  Administered 2021-04-08: 500 mL via INTRAVENOUS

## 2021-04-08 MED ORDER — DEXTROSE 50 % IV SOLN
INTRAVENOUS | Status: AC
  Filled 2021-04-08: qty 50

## 2021-04-08 MED ORDER — POTASSIUM CHLORIDE 10 MEQ/100ML IV SOLN
10.0000 meq | INTRAVENOUS | Status: DC
  Administered 2021-04-08: 10 meq via INTRAVENOUS
  Filled 2021-04-08: qty 100

## 2021-04-08 MED ORDER — GLUCAGON HCL RDNA (DIAGNOSTIC) 1 MG IJ SOLR
INTRAMUSCULAR | Status: AC
  Filled 2021-04-08: qty 1

## 2021-04-08 MED ORDER — NYSTATIN 100000 UNIT/GM EX POWD
Freq: Three times a day (TID) | CUTANEOUS | Status: DC
  Administered 2021-04-16: 1 via TOPICAL
  Filled 2021-04-08 (×2): qty 15

## 2021-04-08 MED ORDER — SODIUM CHLORIDE 0.9 % IV SOLN: 1.0000 g | Freq: Two times a day (BID) | INTRAVENOUS | Status: DC

## 2021-04-08 MED ORDER — SILVER SULFADIAZINE 1 % EX CREA
TOPICAL_CREAM | Freq: Every day | CUTANEOUS | Status: DC
  Filled 2021-04-08 (×2): qty 50

## 2021-04-08 MED ORDER — KCL-LACTATED RINGERS-D5W 20 MEQ/L IV SOLN
INTRAVENOUS | Status: AC
  Filled 2021-04-08 (×2): qty 1000

## 2021-04-08 MED ORDER — POTASSIUM CHLORIDE 20 MEQ PO PACK
20.0000 meq | PACK | Freq: Once | ORAL | Status: DC
  Filled 2021-04-08: qty 1

## 2021-04-08 MED ORDER — GLUCAGON HCL RDNA (DIAGNOSTIC) 1 MG IJ SOLR: 1.0000 mg | INTRAMUSCULAR | Status: AC

## 2021-04-08 MED ORDER — DEXTROSE 50 % IV SOLN
25.0000 g | INTRAVENOUS | Status: AC
  Administered 2021-04-08: 25 g via INTRAVENOUS

## 2021-04-08 MED ORDER — MAGNESIUM SULFATE 2 GM/50ML IV SOLN
2.0000 g | Freq: Once | INTRAVENOUS | Status: AC
  Administered 2021-04-08: 2 g via INTRAVENOUS
  Filled 2021-04-08: qty 50

## 2021-04-08 MED ORDER — POTASSIUM CHLORIDE CRYS ER 20 MEQ PO TBCR: 40.0000 meq | EXTENDED_RELEASE_TABLET | Freq: Once | ORAL | Status: DC

## 2021-04-08 NOTE — Consult Note (Signed)
[name] [name] [birth year]  [id number].    Requesting MD: Dr. Florencia Reasons Chief Complaint/Reason for Consult: abdominal wall excoriation and wounds  HPI:  This is a patient known to our service secondary to a SBO in February for which she and her family after extensive discussions wanted to pursue surgical intervention.  This was done on 08/23/20 by Dr. Zenia Resides.  She had an ex lap with detorsion of her SB with placement of a g-tube.  She had a fairly prolonged post op course and ultimately was discharged to SNF.  She did require use of her g-tube to help with nutrition as she was not taking in much.  She has required several recurrent admission for weakness and failure to thrive as well as leakage around her g-tube.  She had her g-tube replaced by IR in July due to a concern for clogging.  She has continued to have some leakage issues and was supposed to be seen in our office 03-04-21 for evaluation of her g-tube but she removed this herself the day before.  She had since had significant excoriation and wound secondary to leakage from this site.  This site is not healing due to malnutrition, steroid use, etc.  She was seen in our office on 03/23/21 and recommendations for wound care for the SNF to follow.  She has been admitted yesterday secondary to further FTT and evaluation of her abdominal wound.  The patient is unable to really provide any history so all of this is obtained from the chart.  ROS: ROS: Unable to obtain secondary to confusion  Family History  Problem Relation Age of Onset   Kidney disease Daughter 22   Cancer Daughter    Heart attack Father 64   Alzheimer's disease Sister    Heart disease Sister    Alzheimer's disease Sister     Past Medical History:  Diagnosis Date   Allergic rhinitis    Allergy    CAD (coronary artery disease)    Mild CAD by cath 2008   CHF (congestive heart failure) (HCC)    DJD (degenerative joint disease)    rheumatoid   DVT (deep venous thrombosis)  ([place])    Dyslipidemia    History of echocardiogram    Echo 12/2018: EF 60-65, mod asymmetric LVH, Gr 1 DD   History of nuclear stress test    Myoview 5/17:  EF 74%, normal perfusion, low risk // Myoview 12/2018:  EF 89, very mild ischemia in inferoapical wall; Low Risk   History of pulmonary embolism    Hyperlipidemia    Insomnia    Osteoporosis    Psoriasis    Pulmonary fibrosis ([place])    Rheumatoid arthritis ([place])     Past Surgical History:  Procedure Laterality Date   ABDOMINAL HYSTERECTOMY  1974   APPENDECTOMY  1959   CATARACT EXTRACTION     [facility] [place] TUBE CHANGE  11/05/2020   IR [place] GASTRO/COLONIC TUBE PERCUT W/FLUORO  10/29/2020   IR [place] GASTRO/COLONIC TUBE PERCUT W/FLUORO  11/17/2020   IR [place] GASTRO/COLONIC TUBE PERCUT W/FLUORO  01/15/2021   LAPAROTOMY N/A 08/23/2020   Procedure: EXPLORATORY LAPAROTOMY small bowel resection gastrostomy tube placement;  Surgeon: Dwan Bolt, MD;  Location: [facility];  Service: General;  Laterality: N/A;   LEFT HEART CATHETERIZATION WITH CORONARY ANGIOGRAM N/A 06/18/2013   Procedure: LEFT HEART CATHETERIZATION WITH CORONARY ANGIOGRAM;  Surgeon: Peter M Martinique, MD;  Location: [facility] CATH LAB;  Service: Cardiovascular;  Laterality: N/A;  [place], 2007   Bilateral    Social History:  reports that she quit smoking about 47 years ago. Her smoking use included cigarettes. She has a 10.00 pack-year smoking history. She has never used smokeless tobacco. She reports that she does not drink alcohol and does not use drugs.  Allergies:  Allergies  Allergen Reactions   Crestor [Rosuvastatin] Other (See Comments)    Muscle pain   Lactose Intolerance (Gi) Other (See Comments)    Stomach upset   Penicillins Hives   Imuran [Azathioprine] Nausea And Vomiting    Medications Prior to Admission  Medication Sig Dispense Refill   acetaminophen (TYLENOL) 500 MG tablet Take 1,000 mg by mouth in the morning and at  bedtime.     Amino Acids-Protein Hydrolys (PRO-STAT AWC) LIQD Take 30 mLs by mouth daily.     atorvastatin (LIPITOR) 20 MG tablet Take 1 tablet (20 mg total) by mouth daily. (Patient taking differently: Take 10 mg by mouth daily.) 30 tablet 5   Docusate Sodium 100 MG capsule Take 100 mg by mouth daily.     famotidine (PEPCID) 20 MG tablet Take 20 mg by mouth daily.     gabapentin (NEURONTIN) 300 MG capsule Take 1 capsule (300 mg total) by mouth 3 (three) times daily.     lipase/protease/amylase (CREON) 36000 UNITS CPEP capsule Take 1 capsule (36,000 Units total) by mouth 3 (three) times daily before meals. 180 capsule 11   midodrine (PROAMATINE) 5 MG tablet Take 1 tablet (5 mg total) by mouth 3 (three) times daily with meals.     mirtazapine (REMERON) 30 MG tablet Take 15 mg by mouth at bedtime.     Multiple Vitamin (MULTIVITAMIN WITH MINERALS) TABS tablet Take 1 tablet by mouth daily.     Nutritional Supplements (ENSURE ORIGINAL PO) Take 237 mLs by mouth 4 (four) times daily.     oxyCODONE (OXY IR/ROXICODONE) 5 MG immediate release tablet Take 1 tablet (5 mg total) by mouth every 4 (four) hours as needed for severe pain. 5 tablet 0   predniSONE (DELTASONE) 5 MG tablet TAKE 1 TABLET DAILY WITH BREAKFAST (Patient taking differently: Take 5 mg by mouth daily with breakfast.) 90 tablet 3   Dextromethorphan-guaiFENesin (ROBITUSSIN DM PO) Take 10 mLs by mouth every 6 (six) hours as needed (cough/congestion).     guaiFENesin (ROBITUSSIN) 100 MG/5ML liquid Take 100 mg by mouth every 8 (eight) hours as needed for cough.     mycophenolate (CELLCEPT) 500 MG tablet TAKE 2 TABLETS BY MOUTH TWICE DAILY (Patient not taking: No sig reported) 360 tablet 3   nitroGLYCERIN (NITROSTAT) 0.3 MG SL tablet Place 0.3 mg under the tongue every 5 (five) minutes x 3 doses as needed for chest pain.       Physical Exam: Blood pressure (!) 103/51, pulse (!) 102, temperature 98.3 F (36.8 C), temperature source Oral, resp.  rate 18, height 5\' 4"  (1.626 m), SpO2 97 %. General: pleasant, frail, cachetic, elderly female who is laying in bed in NAD HEENT: head is normocephalic, atraumatic.  Sclera are noninjected.  PERRL.  Ears and nose without any masses or lesions.  Mouth is pink and moist Heart: regular, rate, and rhythm.  Normal s1,s2. No obvious murmurs, gallops, or rubs noted.  Palpable radial and pedal pulses bilaterally Lungs: CTAB, no wheezes, rhonchi, or rales noted.  Respiratory effort nonlabored Abd: soft, tender as expected from excoriated wound, ND, +BS, no masses, hernias,  or organomegaly.  Significant excoriation from around her old g-tube site which continues to leak gastric contents and essentially cause burns to her abdominal wall.  Midline wound still with clean open wound at base.    Psych: Alert but not oriented, some confusion   Results for orders placed or performed during the hospital encounter of 04/05/2021 (from the past 48 hour(s))  Comprehensive metabolic panel     Status: Abnormal   Collection Time: 04/05/2021 10:16 PM  Result Value Ref Range   Sodium 146 (H) 135 - 145 mmol/L   Potassium 4.0 3.5 - 5.1 mmol/L   Chloride 104 98 - 111 mmol/L   CO2 33 (H) 22 - 32 mmol/L   Glucose, Bld 106 (H) 70 - 99 mg/dL    Comment: Glucose reference range applies only to samples taken after fasting for at least 8 hours.   BUN 15 8 - 23 mg/dL   Creatinine, Ser 0.73 0.44 - 1.00 mg/dL   Calcium 9.6 8.9 - 10.3 mg/dL   Total Protein 7.7 6.5 - 8.1 g/dL   Albumin 2.5 (L) 3.5 - 5.0 g/dL   AST 16 15 - 41 U/L   ALT 10 0 - 44 U/L   Alkaline Phosphatase 120 38 - 126 U/L   Total Bilirubin 0.7 0.3 - 1.2 mg/dL   GFR, Estimated >60 >60 mL/min    Comment: (NOTE) Calculated using the CKD-EPI Creatinine Equation (2021)    Anion gap 9 5 - 15    Comment: Performed at [facility], [place] [address] [place], [place] [address]  Lactic acid, plasma     Status: None   Collection Time: 03/16/2021 10:16 PM   Result Value Ref Range   Lactic Acid, Venous 1.5 0.5 - 1.9 mmol/L    Comment: Performed at [facility], [place] [address] [place], [place] [address]  CBC with Differential     Status: Abnormal   Collection Time: 04/05/2021 10:16 PM  Result Value Ref Range   WBC 16.0 (H) 4.0 - 10.5 K/uL   RBC 4.32 3.87 - 5.11 MIL/uL   Hemoglobin 12.6 12.0 - 15.0 g/dL   HCT 41.3 36.0 - 46.0 %   MCV 95.6 80.0 - 100.0 fL   MCH 29.2 26.0 - 34.0 pg   MCHC 30.5 30.0 - 36.0 g/dL   RDW 14.6 11.5 - 15.5 %   Platelets 328 150 - 400 K/uL   nRBC 0.0 0.0 - 0.2 %   Neutrophils Relative % 61 %   Neutro Abs 9.7 (H) 1.7 - 7.7 K/uL   Lymphocytes Relative 27 %   Lymphs Abs 4.4 (H) 0.7 - 4.0 K/uL   Monocytes Relative 11 %   Monocytes Absolute 1.7 (H) 0.1 - 1.0 K/uL   Eosinophils Relative 0 %   Eosinophils Absolute 0.0 0.0 - 0.5 K/uL   Basophils Relative 0 %   Basophils Absolute 0.1 0.0 - 0.1 K/uL   Immature Granulocytes 1 %   Abs Immature Granulocytes 0.16 (H) 0.00 - 0.07 K/uL    Comment: Performed at [facility], [place] [address] [place], [place] [address]  Protime-INR     Status: None   Collection Time: 03/27/2021 10:16 PM  Result Value Ref Range   Prothrombin Time 14.7 11.4 - 15.2 seconds   INR 1.1 0.8 - 1.2    Comment: (NOTE) INR goal varies based on device and disease states. Performed at [facility], [place] [address] [place],  [address]  Culture, blood (Routine x 2)     Status: None (Preliminary result)   Collection Time: 03/20/2021 10:16 PM   Specimen: BLOOD LEFT FOREARM  Result Value Ref Range   Specimen Description      BLOOD LEFT FOREARM Performed at [place] [address] [place], [place] [address]    Special Requests      BOTTLES DRAWN AEROBIC AND ANAEROBIC Blood Culture results may not be optimal due to an excessive volume of blood received in culture bottles Performed at [place] [address] [place], [place] [address]    Culture      NO GROWTH < 12 HOURS Performed at [place] [address] [place], [place] [address]    Report Status PENDING   Urinalysis, Routine w reflex microscopic Urine, In & Out Cath     Status: Abnormal   Collection Time: 03/15/2021 10:16 PM  Result Value Ref Range   Color, Urine YELLOW YELLOW   APPearance CLEAR CLEAR   Specific Gravity, Urine 1.010 1.005 - 1.030   pH 7.0 5.0 - 8.0   Glucose, UA NEGATIVE NEGATIVE mg/dL   Hgb urine dipstick TRACE (A) NEGATIVE   Bilirubin Urine NEGATIVE NEGATIVE   Ketones, ur NEGATIVE NEGATIVE mg/dL   Protein, ur TRACE (A) NEGATIVE mg/dL   Nitrite NEGATIVE NEGATIVE   Leukocytes,Ua NEGATIVE NEGATIVE   RBC / HPF 21-50 0 - 5 RBC/hpf   WBC, UA 0-5 0 - 5 WBC/hpf   Bacteria, UA NONE SEEN NONE SEEN   Squamous Epithelial / LPF 0-5 0 - 5   Mucus PRESENT     Comment: Performed at [facility], [place] [address], [place], [place] [address]  ABO/Rh     Status: None   Collection Time: 04/02/2021 10:16 PM  Result Value Ref Range   ABO/RH(D)      O POS Performed at [facility], [place] [address] [place], [place] [address]   Type and screen [place]     Status: None   Collection Time: 03/29/2021 10:40 PM  Result Value Ref Range   ABO/RH(D) O POS    Antibody Screen NEG    Sample Expiration      04/09/2021,2359 Performed at [facility], [place] [address], [place], [place] [address]   Resp Panel by RT-PCR (Flu A&B, Covid) Nasopharyngeal Swab     Status: None   Collection Time: 03/26/2021 10:41 PM   Specimen: Nasopharyngeal Swab; Nasopharyngeal(NP) swabs in vial transport medium  Result Value Ref Range   SARS Coronavirus 2 by RT PCR NEGATIVE NEGATIVE    Comment: (NOTE) SARS-CoV-2 target nucleic acids are NOT DETECTED.  The SARS-CoV-2 RNA is generally detectable in upper respiratory specimens during the acute phase of infection. The  lowest concentration of SARS-CoV-2 viral copies this assay can detect is 138 copies/mL. A negative result does not preclude SARS-Cov-2 infection and should not be used as the sole basis for treatment or other patient management decisions. A negative result may occur with  improper specimen collection/handling, submission of specimen other than nasopharyngeal swab, presence of viral mutation(s) within the areas targeted by this assay, and inadequate number of viral copies(<138 copies/mL). A negative result must be combined with clinical observations, patient history, and epidemiological information. The expected result is Negative.  Fact Sheet for Patients:  EntrepreneurPulse.com.au  Fact Sheet for Healthcare Providers:  IncredibleEmployment.be  This test is no t yet approved or cleared  by the [place] and  has been authorized for detection and/or diagnosis of SARS-CoV-2 by FDA under an Emergency Use Authorization (EUA). This EUA will remain  in effect (meaning this test can be used) for the duration of the COVID-19 declaration under Section 564(b)(1) of the Act, 21 U.S.C.section 360bbb-3(b)(1), unless the authorization is terminated  or revoked sooner.       Influenza A by PCR NEGATIVE NEGATIVE   Influenza B by PCR NEGATIVE NEGATIVE    Comment: (NOTE) The Xpert Xpress SARS-CoV-2/FLU/RSV plus assay is intended as an aid in the diagnosis of influenza from Nasopharyngeal swab specimens and should not be used as a sole basis for treatment. Nasal washings and aspirates are unacceptable for Xpert Xpress SARS-CoV-2/FLU/RSV testing.  Fact Sheet for Patients: EntrepreneurPulse.com.au  Fact Sheet for Healthcare Providers: IncredibleEmployment.be  This test is not yet approved or cleared by the [place] FDA and has been authorized for detection and/or diagnosis of SARS-CoV-2 by FDA under an Emergency  Use Authorization (EUA). This EUA will remain in effect (meaning this test can be used) for the duration of the COVID-19 declaration under Section 564(b)(1) of the Act, 21 U.S.C. section 360bbb-3(b)(1), unless the authorization is terminated or revoked.  Performed at [facility], [place] [address], [place], [place] [address]   Aerobic/Anaerobic Culture w Gram Stain (surgical/deep wound)     Status: None (Preliminary result)   Collection Time: 04/01/2021 11:23 PM   Specimen: Abdomen; Wound  Result Value Ref Range   Specimen Description      ABDOMEN Performed at [place] [address] [place], [place] [address]    Special Requests      NONE Performed at [facility], [place] [address] [place], [place] [address]    Gram Stain      FEW SQUAMOUS EPITHELIAL CELLS PRESENT FEW WBC SEEN MODERATE GRAM POSITIVE COCCI Performed at [facility], [place] [address] [place], [place] [address]    Culture PENDING    Report Status PENDING   Aerobic Culture w Gram Stain (superficial specimen)     Status: None (Preliminary result)   Collection Time: 03/13/2021 11:23 PM   Specimen: Abdomen; Wound  Result Value Ref Range   Specimen Description      ABDOMEN Performed at [place] [address], [place], [place] [address]    Special Requests      NONE Performed at [facility], [place] [address], [place], [place] [address]    Gram Stain      NO SQUAMOUS EPITHELIAL CELLS SEEN FEW WBC SEEN NO ORGANISMS SEEN Performed at [facility], [place] [address], [place], [place] [address]    Culture PENDING    Report Status PENDING   Basic metabolic panel     Status: Abnormal   Collection Time: 04/08/21  4:36 AM  Result Value Ref Range   Sodium 140 135 - 145 mmol/L   Potassium 3.2 (L) 3.5 - 5.1 mmol/L    Comment: DELTA CHECK NOTED   Chloride 105 98 - 111 mmol/L   CO2 29 22 - 32 mmol/L   Glucose,  Bld 66 (L) 70 - 99 mg/dL    Comment: Glucose reference range applies only to samples taken after fasting for at least 8 hours.   BUN 11 8 - 23 mg/dL   Creatinine, Ser 0.48 0.44 - 1.00 mg/dL   Calcium 8.2 (L) 8.9 - 10.3 mg/dL  GFR, Estimated >60 >60 mL/min    Comment: (NOTE) Calculated using the CKD-EPI Creatinine Equation (2021)    Anion gap 6 5 - 15    Comment: Performed at [facility], [place] [address] [place], [place] [address]  CBC     Status: Abnormal   Collection Time: 04/08/21  4:36 AM  Result Value Ref Range   WBC 16.3 (H) 4.0 - 10.5 K/uL   RBC 3.19 (L) 3.87 - 5.11 MIL/uL   Hemoglobin 9.5 (L) 12.0 - 15.0 g/dL   HCT 30.1 (L) 36.0 - 46.0 %   MCV 94.4 80.0 - 100.0 fL   MCH 29.8 26.0 - 34.0 pg   MCHC 31.6 30.0 - 36.0 g/dL   RDW 14.6 11.5 - 15.5 %   Platelets 244 150 - 400 K/uL   nRBC 0.0 0.0 - 0.2 %    Comment: Performed at [facility], [place] [address] [place], [place] [address]  Magnesium     Status: Abnormal   Collection Time: 04/08/21  4:36 AM  Result Value Ref Range   Magnesium 1.6 (L) 1.7 - 2.4 mg/dL    Comment: Performed at [facility], [place] [address] [place],  [address]  Glucose, capillary     Status: Abnormal   Collection Time: 04/08/21  9:08 AM  Result Value Ref Range   Glucose-Capillary 51 (L) 70 - 99 mg/dL    Comment: Glucose reference range applies only to samples taken after fasting for at least 8 hours.   DG Chest 2 View  Result Date: 04/09/2021 CLINICAL DATA:  Sepsis EXAM: CHEST - 2 VIEW COMPARISON:  03/22/2021 FINDINGS: Chronic, extensive bilateral lower lung zone predominant peripheral reticular infiltrate is again seen and appears stable in keeping with underlying pulmonary fibrosis, better appreciated on CT examination of 03/26/2021. No superimposed focal pulmonary infiltrate. No pneumothorax or pleural effusion. Cardiac size within normal limits. No acute bone abnormality. IMPRESSION:  Chronic infiltrate in keeping with pulmonary fibrosis. No superimposed acute cardiopulmonary disease. Electronically Signed   By: Fidela Salisbury M.D.   On: 03/24/2021 23:14   CT HEAD WO CONTRAST (5MM)  Result Date: 04/07/2021 CLINICAL DATA:  Encephalopathy EXAM: CT HEAD WITHOUT CONTRAST TECHNIQUE: Contiguous axial images were obtained from the base of the skull through the vertex without intravenous contrast. COMPARISON:  03/22/2021 FINDINGS: Brain: There is no mass, hemorrhage or extra-axial collection. There is generalized atrophy without lobar predilection. Hypodensity of the white matter is most commonly associated with chronic microvascular disease. Vascular: No abnormal hyperdensity of the major intracranial arteries or dural venous sinuses. No intracranial atherosclerosis. Skull: The visualized skull base, calvarium and extracranial soft tissues are normal. Sinuses/Orbits: No fluid levels or advanced mucosal thickening of the visualized paranasal sinuses. No mastoid or middle ear effusion. The orbits are normal. IMPRESSION: Generalized atrophy and chronic microvascular ischemia without acute intracranial abnormality. Electronically Signed   By: Ulyses Jarred M.D.   On: 04/07/2021 00:34   CT ABDOMEN PELVIS W CONTRAST  Result Date: 04/07/2021 CLINICAL DATA:  Diffuse abdominal pain, gastrostomy tube not in place, chronic nonhealing wound EXAM: CT ABDOMEN AND PELVIS WITH CONTRAST TECHNIQUE: Multidetector CT imaging of the abdomen and pelvis was performed using the standard protocol following bolus administration of intravenous contrast. CONTRAST:  59mL OMNIPAQUE IOHEXOL 350 MG/ML SOLN COMPARISON:  10/24/2020 FINDINGS: Lower chest: Subpleural reticulation/fibrosis in the visualized lungs, suggesting chronic interstitial lung disease, grossly unchanged. Hepatobiliary: Liver is within normal limits. Distended gallbladder, chronic. No intrahepatic ductal  dilatation. Dilated common duct, measuring 16 mm  (coronal image 38), chronic. Pancreas: Within normal limits. Spleen: Within normal limits. Adrenals/Urinary Tract: Adrenal glands are within normal limits. Kidneys are within normal limits.  No hydronephrosis. Bladder is within normal limits. Stomach/Bowel: Stomach is notable for a percutaneous gastrostomy tract along the left anterior abdominal wall (series 2/image 41) without indwelling gastrostomy tube. No evidence of bowel obstruction. Appendix is not discretely visualized. Scattered left colonic diverticulosis, without evidence of diverticulitis. Moderate left colonic stool burden, suggesting constipation. Moderate to large rectal stool burden (series 2/image 68), suggesting fecal impaction. Vascular/Lymphatic: No evidence of abdominal aortic aneurysm. Atherosclerotic calcifications of the abdominal aorta and branch vessels. No suspicious abdominopelvic lymphadenopathy. Reproductive: Status post hysterectomy. No adnexal masses. Other: No abdominopelvic ascites. Musculoskeletal: Mild soft tissue thickening with a subcutaneous tract in the left medial gluteal region (series 2/image 70), extending to the coccyx, suggesting a sacral decubitus ulcer with overlying chronic osteomyelitis (sagittal image 82). Mild degenerative changes of the lower lumbar spine. IMPRESSION: Sacral decubitus ulcer with overlying chronic osteomyelitis involving the coccyx. Constipation with moderate rectal fecal impaction. Percutaneous gastrostomy tract along the left mid abdominal wall, without visualization of the gastrostomy tube. Chronic gallbladder distension with dilated common duct. No intrahepatic ductal dilatation. Additional stable ancillary findings as above. Electronically Signed   By: Julian Hy M.D.   On: 04/07/2021 01:05      Assessment/Plan Abdominal wall excoriation secondary to leakage from old g-tube site and midline wound -no overt evidence of infection, just significant excoriation -will place silavadene  cream on the excoriated part of her abdomen and a WD over the remaining open midline wound -will need to discuss with family.  Ideal rec would be to plug g-tube hole back up with a g-tube by IR to minimize leakage to help heal her skin -she can have nutrition via this as needed -unfortunately this patient's ability to heal is clearly very poor as her wound from February is still present -palliative care consult is recommended and pending as this patient's quality of life is struggling and having significant pain from this problem. -she is not a candidate for surgical fixation as this wound not heal -best recommendations for this patient at this time would be to keep her as comfortable as possible. -will try to meet with family when they come to the hospital later today    Henreitta Cea, [facility] 04/08/2021, 10:18 AM Please see Amion for pager number during day hours 7:00am-4:30pm or 7:00am -11:30am on weekends

## 2021-04-08 NOTE — Evaluation (Signed)
Clinical/Bedside Swallow Evaluation Patient Details  Name: [name] [name] MRN: [id number] Date of Birth: 16-Oct-1930  Today's Date: 04/08/2021 Time: SLP Start Time (ACUTE ONLY): 1120 SLP Stop Time (ACUTE ONLY): 1145 SLP Time Calculation (min) (ACUTE ONLY): 25 min  Past Medical History:  Past Medical History:  Diagnosis Date   Allergic rhinitis    Allergy    CAD (coronary artery disease)    Mild CAD by cath 2008   CHF (congestive heart failure) (HCC)    DJD (degenerative joint disease)    rheumatoid   DVT (deep venous thrombosis) ([place])    Dyslipidemia    History of echocardiogram    Echo 12/2018: EF 60-65, mod asymmetric LVH, Gr 1 DD   History of nuclear stress test    Myoview 5/17:  EF 74%, normal perfusion, low risk // Myoview 12/2018:  EF 89, very mild ischemia in inferoapical wall; Low Risk   History of pulmonary embolism    Hyperlipidemia    Insomnia    Osteoporosis    Psoriasis    Pulmonary fibrosis ([place])    Rheumatoid arthritis ([place])    Past Surgical History:  Past Surgical History:  Procedure Laterality Date   ABDOMINAL HYSTERECTOMY  1974   APPENDECTOMY  1959   CATARACT EXTRACTION     IR [name] TUBE CHANGE  11/05/2020   IR [place] GASTRO/COLONIC TUBE PERCUT W/FLUORO  10/29/2020   IR [place] GASTRO/COLONIC TUBE PERCUT W/FLUORO  11/17/2020   IR [place] GASTRO/COLONIC TUBE PERCUT W/FLUORO  01/15/2021   LAPAROTOMY N/A 08/23/2020   Procedure: EXPLORATORY LAPAROTOMY small bowel resection gastrostomy tube placement;  Surgeon: Dwan Bolt, MD;  Location: [facility];  Service: General;  Laterality: N/A;   LEFT HEART CATHETERIZATION WITH CORONARY ANGIOGRAM N/A 06/18/2013   Procedure: LEFT HEART CATHETERIZATION WITH CORONARY ANGIOGRAM;  Surgeon: Peter M Martinique, MD;  Location: [facility] CATH LAB;  Service: Cardiovascular;  Laterality: N/A;   TONSILLECTOMY  1941, [place], 2007   Bilateral   HPI:  87yo female admitted 03/19/2021 from SNF with AMS, chronic abdominal wound.  PMH: CHF, chronic nonhealing abdominal wound, DVT/PE, CAD, RA, PEG tube (now removed)    Assessment / Plan / Recommendation  Clinical Impression  Pt seen at bedside for assessment of swallow function and safety. RN reports pt has tolerated soft/regular textures, thin liquids, and meds crushed in puree, but has had significantly poor PO intake. CN exam WFL per observation - pt has difficulty following commands consistently. She is edentulous. Pt accepted minimal boluses of items from her lunch tray - mashed potatoes, pot roast, soup, banana, and water. Extended oral prep noted even given small boluses. Pt required encouragement to not talk with food in her mouth. No overt s/s aspiration observed on any texture presented. If intake at breakfast and lunch is typical, pt is likely not meeting her nutrition needs. She was seen for speech therapy during rehab stay at [facility] (May 2022), and progressed to regular solids and thin liquids to provide full range of choices for PO intake. It is for this reason that regular diet/thin liquids will be continued, although this diet recommendations increases aspiration risk associated with fatigue. Safe swallow precautions posted at [facility]. No further ST intervention recommended at this time. Please reconsult if needs arise.  SLP Visit Diagnosis: Dysphagia, unspecified (R13.10)    Aspiration Risk  Mild aspiration risk;Risk for inadequate nutrition/hydration    Diet Recommendation Regular;Thin liquid   Liquid Administration via: Cup;Straw Medication Administration:  Crushed with puree Supervision: Full supervision/cueing for compensatory strategies;Staff to assist with self feeding Compensations: Minimize environmental distractions;Slow rate;Small sips/bites Postural Changes: Seated upright at 90 degrees;Remain upright for at least 30 minutes after po intake    Other  Recommendations Oral Care Recommendations: Oral care BID    Recommendations for follow up therapy are  one component of a multi-disciplinary discharge planning process, led by the attending physician.  Recommendations may be updated based on patient status, additional functional criteria and insurance authorization.  Follow up Recommendations 24 hour supervision/assistance          Prognosis Prognosis for Safe Diet Advancement: Fair      Swallow Study   General Date of Onset: 03/28/2021 HPI: 87yo female admitted 03/23/2021 from SNF with AMS, chronic abdominal wound. PMH: CHF, chronic nonhealing abdominal wound, DVT/PE, CAD, RA, PEG tube (now removed) Type of Study: Bedside Swallow Evaluation Previous Swallow Assessment: Dysphagia therapy at [facility] in May 2022 - advanced to regular/thin Diet Prior to this Study: Regular;Thin liquids Temperature Spikes Noted: No Respiratory Status: Nasal cannula History of Recent Intubation: No Behavior/Cognition: Alert;Cooperative;Pleasant mood;Requires cueing Oral Cavity Assessment: Within Functional Limits Oral Care Completed by SLP: No Oral Cavity - Dentition: Edentulous Vision: Functional for self-feeding Self-Feeding Abilities: Needs assist Patient Positioning: Upright in bed Baseline Vocal Quality: Normal Volitional Cough: Cognitively unable to elicit Volitional Swallow: Unable to elicit    Oral/Motor/Sensory Function Overall Oral Motor/Sensory Function: Within functional limits   Ice Chips Ice chips: Not tested   Thin Liquid Thin Liquid: Within functional limits Presentation: Cup;Straw    Nectar Thick Nectar Thick Liquid: Not tested   Honey Thick Honey Thick Liquid: Not tested   Puree Puree: Within functional limits Presentation: Spoon   Solid     Solid: Impaired Oral Phase Impairments: Impaired mastication Oral Phase Functional Implications: Impaired mastication;Prolonged oral transit     Chesley Veasey B. Quentin Ore, [facility], [place] Speech Language Pathologist Office: [phone]  Shonna Chock 04/08/2021,11:57 AM

## 2021-04-08 NOTE — Consult Note (Signed)
Consultation Note Date: 04/08/2021   Patient Name: [name] [name]  DOB: 05-17-1931  MRN: [id number]  Age / Sex: 87 y.o., female  PCP: Hoyt Koch, MD Referring Physician: Florencia Reasons, MD  Reason for Consultation: Establishing goals of care  HPI/Patient Profile: 87 y.o. female    admitted on 03/21/2021    Clinical Assessment and Goals of Care: 87 year old lady known to palliative medicine service from previous hospitalization, see surgery in outpatient setting.  Small bowel obstruction in February for which he underwent ex lap with detorsion of her small bowel alongside placement of G-tube.  Prolonged postoperative course after that and was discharged to SNF.  Several recurrent admissions for weakness failure to thrive as well as leakage around her G-tube.  G-tube replaced by interventional radiology in July for concern for clogging.  Patient removed G-tube in August and has had a significant excoriation and wound secondary to leakage.  Patient admitted to hospital medicine service for abdominal wound and failure to thrive. Chart reviewed.  Patient is seen by [facility] staff at her nursing facility.  Her CODE STATUS is full code.  She has been seen and evaluated by general surgery as well as infectious disease and speech therapy earlier today. Palliative medicine consultation has been requested for CODE STATUS and goals of care discussions. After discussions with [facility] MD as well as infectious disease, as part of initial consultation a family meeting was done with the patient and her nephew [name].  Subsequently, a family meeting was done outside the room with the patient's nephew [name] as well as sister-in-law Gwen on the phone. Palliative medicine is specialized medical care for people living with serious illness. It focuses on providing relief from the symptoms and stress of a serious illness. The  goal is to improve quality of life for both the patient and the family. Goals of care: Broad aims of medical therapy in relation to the patient's values and preferences. Our aim is to provide medical care aimed at enabling patients to achieve the goals that matter most to them, given the circumstances of their particular medical situation and their constraints.  Patient's family reviewed her entire medical care since February of this year.  We discussed about her extensive hospitalizations, rehab attempts and overall condition. At 1500, another family meeting was held.  Patient's nephew [name] sister-in-law Meredith Mody as well as friend [name] were all present.  This meeting was held alongside surgical colleague Saverio Danker, PA.  We reviewed again about patient's current condition.  This meeting was held inside the patient's room.  Patient is awake alert and reasonably interactive.  However, patient complained of pain both in her abdomen and back and close to her eyes and did not participate much in the meeting.  We discussed about CODE STATUS full code versus DO NOT RESUSCITATE.  From a surgical perspective, recommendations for IR to reinsert a gastrostomy tube so as to avoid further skin damage from leaking of contents was discussed.  Placing that PEG initially to gravity and subsequently  also for artificial nutrition was explored. Alternatively, a more comfort focused pathway of care, with consideration for DNR/DNI as well as aggressive symptom management was discussed in detail.  All of the patient's family's questions answered to the best of my ability.  Family to discuss further amongst themselves as well as with the patient.   NEXT OF KIN Nephew, sister-in-law Gwen and friend [name].  SUMMARY OF RECOMMENDATIONS   Full code/full scope for now Ongoing goals of care discussions with patient's sister-in-law, friend and nephew with regards to appropriate next steps. Palliative medicine team to  follow.  Code Status/Advance Care Planning: Full code   Symptom Management:     Palliative Prophylaxis:  Delirium Protocol  Additional Recommendations (Limitations, Scope, Preferences): Full Scope Treatment  Psycho-social/Spiritual:  Desire for further Chaplaincy support:yes Additional Recommendations: Caregiving  Support/Resources  Prognosis:  Unable to determine  Discharge Planning: To Be Determined      Primary Diagnoses: Present on Admission:  AMS (altered mental status)  Chronic diastolic CHF (congestive heart failure) ([place])   I have reviewed the medical record, interviewed the patient and family, and examined the patient. The following aspects are pertinent.  Past Medical History:  Diagnosis Date   Allergic rhinitis    Allergy    CAD (coronary artery disease)    Mild CAD by cath 2008   CHF (congestive heart failure) (HCC)    DJD (degenerative joint disease)    rheumatoid   DVT (deep venous thrombosis) (HCC)    Dyslipidemia    History of echocardiogram    Echo 12/2018: EF 60-65, mod asymmetric LVH, Gr 1 DD   History of nuclear stress test    Myoview 5/17:  EF 74%, normal perfusion, low risk // Myoview 12/2018:  EF 89, very mild ischemia in inferoapical wall; Low Risk   History of pulmonary embolism    Hyperlipidemia    Insomnia    Osteoporosis    Psoriasis    Pulmonary fibrosis (HCC)    Rheumatoid arthritis ([place])    Social History   Socioeconomic History   Marital status: Widowed    Spouse name: Not on file   Number of children: 2   Years of education: Not on file   Highest education level: Not on file  Occupational History   Occupation: Publishing rights manager    Comment: retired  Tobacco Use   Smoking status: Former    Packs/day: 0.50    Years: 20.00    Pack years: 10.00    Types: Cigarettes    Quit date: 07/11/1973    Years since quitting: 47.7   Smokeless tobacco: Never  Vaping Use   Vaping Use: Never used  Substance and  Sexual Activity   Alcohol use: No   Drug use: No   Sexual activity: Never  Other Topics Concern   Not on file  Social History Narrative   Diet:   Do you drink/eat things with caffeine? Yes   Marital status:        Widowed                      What year were you married?1954   Do you live in a house, apartment, assisted living, condo, trailer, etc)? House   Is it one or more stories? 1 1/4   How many persons live in your home?1   Do you have any pets in your home? No   Current or past profession:  [employer]  you exercise?        Yes                                            Type & how often:  Walk                                    Do you have a living will? Yes   Do you have a DNR Form? Yes   Do you have a POA/HPOA forms?  Yes   Social Determinants of Health   Financial Resource Strain: Low Risk    Difficulty of Paying Living Expenses: Not hard at all  Food Insecurity: No Food Insecurity   Worried About Charity fundraiser in the Last Year: Never true   [place] in the Last Year: Never true  Transportation Needs: No Transportation Needs   Lack of Transportation (Medical): No   Lack of Transportation (Non-Medical): No  Physical Activity: Not on file  Stress: Not on file  Social Connections: Not on file   Family History  Problem Relation Age of Onset   Kidney disease Daughter 5   Cancer Daughter    Heart attack Father 26   Alzheimer's disease Sister    Heart disease Sister    Alzheimer's disease Sister    Scheduled Meds:  atorvastatin  10 mg Oral Daily   docusate sodium  100 mg Oral Daily   enoxaparin (LOVENOX) injection  30 mg Subcutaneous Q24H   famotidine  20 mg Oral Daily   glucagon (human recombinant)       glucagon (human recombinant)  1 mg Intramuscular STAT   lipase/protease/amylase  36,000 Units Oral TID AC   midodrine  5 mg Oral TID WC   mirtazapine  15 mg Oral QHS   nystatin   Topical TID   potassium chloride  20 mEq Oral  Once   silver sulfADIAZINE   Topical Daily   Continuous Infusions:  dextrose 5% lactated ringers with KCl 20 mEq/L     lactated ringers     PRN Meds:.acetaminophen **OR** acetaminophen, HYDROmorphone (DILAUDID) injection, ondansetron **OR** ondansetron (ZOFRAN) IV, oxyCODONE Medications Prior to Admission:  Prior to Admission medications   Medication Sig Start Date End Date Taking? Authorizing Provider  acetaminophen (TYLENOL) 500 MG tablet Take 1,000 mg by mouth in the morning and at bedtime.   Yes [provider]  Amino Acids-Protein Hydrolys (PRO-STAT AWC) LIQD Take 30 mLs by mouth daily.   Yes [provider]  atorvastatin (LIPITOR) 20 MG tablet Take 1 tablet (20 mg total) by mouth daily. Patient taking differently: Take 10 mg by mouth daily. 01/08/21 07/07/21 Yes Loel Dubonnet, NP  Docusate Sodium 100 MG capsule Take 100 mg by mouth daily.   Yes [provider]  famotidine (PEPCID) 20 MG tablet Take 20 mg by mouth daily.   Yes [provider]  gabapentin (NEURONTIN) 300 MG capsule Take 1 capsule (300 mg total) by mouth 3 (three) times daily. 11/23/20  Yes Angiulli, Lavon Paganini, PA-C  lipase/protease/amylase (CREON) 36000 UNITS CPEP capsule Take 1 capsule (36,000 Units total) by mouth 3 (three) times daily before meals. 10/09/20  Yes Hoyt Koch, MD  midodrine (PROAMATINE) 5 MG tablet Take 1 tablet (5 mg total) by mouth 3 (  three) times daily with meals. 11/23/20  Yes Angiulli, Lavon Paganini, PA-C  mirtazapine (REMERON) 30 MG tablet Take 15 mg by mouth at bedtime.   Yes [provider]  Multiple Vitamin (MULTIVITAMIN WITH MINERALS) TABS tablet Take 1 tablet by mouth daily. 11/05/20  Yes Mercy Riding, MD  Nutritional Supplements (ENSURE ORIGINAL PO) Take 237 mLs by mouth 4 (four) times daily.   Yes [provider]  oxyCODONE (OXY IR/ROXICODONE) 5 MG immediate release tablet Take 1 tablet (5 mg total) by mouth every 4 (four) hours as  needed for severe pain. 11/23/20  Yes Angiulli, Lavon Paganini, PA-C  predniSONE (DELTASONE) 5 MG tablet TAKE 1 TABLET DAILY WITH BREAKFAST Patient taking differently: Take 5 mg by mouth daily with breakfast. 05/08/20  Yes Mannam, Praveen, MD  Dextromethorphan-guaiFENesin (ROBITUSSIN DM PO) Take 10 mLs by mouth every 6 (six) hours as needed (cough/congestion).    [provider]  guaiFENesin (ROBITUSSIN) 100 MG/5ML liquid Take 100 mg by mouth every 8 (eight) hours as needed for cough.    [provider]  mycophenolate (CELLCEPT) 500 MG tablet TAKE 2 TABLETS BY MOUTH TWICE DAILY Patient not taking: No sig reported 01/18/21   Mannam, Hart Robinsons, MD  nitroGLYCERIN (NITROSTAT) 0.3 MG SL tablet Place 0.3 mg under the tongue every 5 (five) minutes x 3 doses as needed for chest pain.    [provider]   Allergies  Allergen Reactions   Crestor [Rosuvastatin] Other (See Comments)    Muscle pain   Lactose Intolerance (Gi) Other (See Comments)    Stomach upset   Penicillins Hives   Imuran [Azathioprine] Nausea And Vomiting   Review of Systems Verbalizes some, denies pain. Physical Exam Patient is frail and cachectic she is elderly lady resting in bed Answers a few questions appropriately Regular work of breathing S1-S2 Abdomen: Mild to moderate generalized tenderness excoriated wound significant excoriation around old G-tube site, pictures noted on documentation from general surgery.  Vital Signs: BP (!) 100/49 (BP Location: Right Arm) Comment: nurse took it manually  Pulse (!) 111   Temp 98.4 F (36.9 C) (Oral)   Resp 17   Ht 5\' 4"  (1.626 m)   SpO2 100%   BMI 17.16 kg/m  Pain Scale: 0-10   Pain Score: 0-No pain   SpO2: SpO2: 100 % O2 Device:SpO2: 100 % O2 Flow Rate: .O2 Flow Rate (L/min): 2 L/min  IO: Intake/output summary:  Intake/Output Summary (Last 24 hours) at 04/08/2021 1334 Last data filed at 04/08/2021 0900 Gross per 24 hour  Intake 1178.84 ml  Output --   Net 1178.84 ml    LBM: Last BM Date: 04/08/21 Baseline Weight:   Most recent weight:       Palliative Assessment/Data:   PPS 40%  Time In:  12 Time Out:  1330 Time Total:  90 min.  Greater than 50%  of this time was spent counseling and coordinating care related to the above assessment and plan.  Signed by: Loistine Chance, MD   Please contact Palliative Medicine Team phone at [phone] for questions and concerns.  For individual provider: See Shea Evans

## 2021-04-08 NOTE — Progress Notes (Addendum)
Patients blood pressure noted to be 66/57 automatic. Manually took patients blood pressure BP 100/49 manually. MD paged and charge nurse notified. See new orders.

## 2021-04-08 NOTE — Progress Notes (Signed)
PHARMACY NOTE:  ANTIMICROBIAL RENAL DOSAGE ADJUSTMENT  Current antimicrobial regimen includes a mismatch between antimicrobial dosage and estimated renal function.  As per policy approved by the Pharmacy & Therapeutics and Medical Executive Committees, the antimicrobial dosage will be adjusted accordingly.  Current antimicrobial dosage:  meropenem 1g IV q8 hours  Indication: chronic osteomyelitis, chronic abdominal wound  Renal Function:  CrCl cannot be calculated (Unknown ideal weight.). []      On intermittent HD, scheduled: []      On CRRT  Using most recent weight of 45 kg from 03/22/21 >> CrCl ~26 mL/min    Antimicrobial dosage has been changed to:  meropenem 1g IV q12 hours  Additional comments:   Thank you for allowing pharmacy to be a part of this patient's care.  Dimple Nanas, PharmD 04/08/2021 7:51 AM

## 2021-04-08 NOTE — Evaluation (Signed)
Physical Therapy Evaluation Patient Details Name: [name] [name] MRN: [id number] DOB: [birth year] Today's Date: 04/08/2021  History of Present Illness  Patient is 87 y.o. female admitted from SNF due to FTT and for abdominal wound. Pt hadhad an ex lap with detorsion of her SB with placement of a g-tube PEG placed 08/23/20 and had extensive recovery with CIR therapy and then SNF placement.  Patient has required several recurrent admission for weakness and failure to thrive as well as leakage around her g-tube.  She had her g-tube replaced by IR in July due to a concern for clogging. Pt has had significant excoriation and wound secondary to leakage from this site. During work-up she was found to have a sacral decubitus ulcer with chronic osteomyelitis on CT scan.  PMH significant for CAD, CHF, RA, HLD, steoporosis, psoriasis, pulmonary fibrosis.    Clinical Impression  [name] [name] is 87 y.o. female admitted with above HPI and diagnosis. Patient is currently limited by functional impairments below (see PT problem list). Patient unreliable historian given AMS and had poor safety awareness with mobility. Mod assist required for bed mobility and bed<>BSC transfer with RW. Pt unable to void on BSC and returned to bed where she then voided bladder/bowel and required assist for clean up. Patient will benefit from continued skilled PT interventions to address impairments and progress independence with mobility, recommending return to SNF for 24/7 supervision/assist and therapy. Acute PT will follow and progress as able.        Recommendations for follow up therapy are one component of a multi-disciplinary discharge planning process, led by the attending physician.  Recommendations may be updated based on patient status, additional functional criteria and insurance authorization.  Follow Up Recommendations SNF    Equipment Recommendations  None recommended by PT    Recommendations for Other Services OT  consult     Precautions / Restrictions Precautions Precautions: Fall Restrictions Weight Bearing Restrictions: No      Mobility  Bed Mobility Overal bed mobility: Needs Assistance Bed Mobility: Rolling;Supine to Sit;Sit to Supine Rolling: Min assist   Supine to sit: Min assist Sit to supine: Min assist   General bed mobility comments: cues to roll with use of bed rail. assist to bring LE's on/off EOB and to raise trunk. pt impulsive and required cues for safe sequencing.    Transfers Overall transfer level: Needs assistance Equipment used: Rolling walker (2 wheeled) Transfers: Sit to/from [facility] Sit to Stand: Mod assist Stand pivot transfers: Mod assist       General transfer comment: Mod cues for safety and assist to steady balance with rise and step to move bed<>BSC. pt unsafe with hand placement on RW and impaired problem solving requiring manual assist/tactile cues for safe hand placement for power up use of RW.  Ambulation/Gait                Stairs            Wheelchair Mobility    Modified Rankin (Stroke Patients Only)       Balance Overall balance assessment: Needs assistance Sitting-balance support: Feet supported Sitting balance-[name]: Fair     Standing balance support: During functional activity;Bilateral upper extremity supported Standing balance-[name]: Poor                               Pertinent Vitals/Pain Pain Assessment: No/denies pain    Home Living Family/patient  expects to be discharged to:: Skilled nursing facility Living Arrangements:  (ALF) Available Help at Discharge: Family;Available 24 hours/day;Other (Comment) (Pt had an aide come in for a few days a week PTA in April 2022) Type of Home: House Home Access: Level entry     Home Layout: One Grant - single point;Walker - 2 wheels Additional Comments: pt admitted from SNF and unable to provide PLOF  or home set up due to cognition/AMS. info per chart review from prior admission in April    Prior Function Level of Independence: Needs assistance         Comments: ~2 months prior to April admission she was independent with ambulation and had an aide a few hr/day 2 days/week.  Has had recent falls.  pt d/c to SNF for 2 weeks after last admission then to ALF with 24 hr care.     Hand Dominance   Dominant Hand: Right    Extremity/Trunk Assessment   Upper Extremity Assessment Upper Extremity Assessment: Defer to OT evaluation;Generalized weakness    Lower Extremity Assessment Lower Extremity Assessment: Generalized weakness    Cervical / Trunk Assessment Cervical / Trunk Assessment: Kyphotic  Communication   Communication: Other (comment)  Cognition Arousal/Alertness: Awake/alert Behavior During Therapy: Impulsive Overall Cognitive Status: No family/caregiver present to determine baseline cognitive functioning Area of Impairment: Orientation;Attention;Memory;Following commands;Safety/judgement;Awareness;Problem solving                 Orientation Level: Disoriented to;Situation;Place;Time Current Attention Level: Focused Memory: Decreased recall of precautions;Decreased short-term memory Following Commands: Follows one step commands inconsistently;Follows one step commands with increased time Safety/Judgement: Decreased awareness of safety;Decreased awareness of deficits Awareness: Intellectual Problem Solving: Slow processing;Difficulty sequencing;Requires verbal cues;Requires tactile cues General Comments: pt impulsive with transfer and poor safety awareness for technique to stand and transfer with RW for bed<>BSC.      General Comments      Exercises     Assessment/Plan    PT Assessment Patient needs continued PT services  PT Problem List Decreased strength;Decreased activity tolerance;Decreased balance;Decreased mobility;Decreased knowledge of use of  DME;Decreased cognition;Decreased safety awareness;Decreased knowledge of precautions       PT Treatment Interventions DME instruction;Stair training;Gait training;Functional mobility training;Therapeutic activities;Therapeutic exercise;Balance training;Patient/family education    PT Goals (Current goals can be found in the Care Plan section)  Acute Rehab PT Goals PT Goal Formulation: Patient unable to participate in goal setting Time For Goal Achievement: 04/22/21 Potential to Achieve Goals: Fair    Frequency Min 2X/week   Barriers to discharge        Co-evaluation               AM-PAC PT "6 Clicks" Mobility  Outcome Measure Help needed turning from your back to your side while in a flat bed without using bedrails?: A Little Help needed moving from lying on your back to sitting on the side of a flat bed without using bedrails?: A Little Help needed moving to and from a bed to a chair (including a wheelchair)?: A Lot Help needed standing up from a chair using your arms (e.g., wheelchair or bedside chair)?: A Lot Help needed to walk in hospital room?: A Lot Help needed climbing 3-5 steps with a railing? : Total 6 Click Score: 13    End of Session Equipment Utilized During Treatment: Gait belt Activity Tolerance: Patient limited by fatigue Patient left: in bed;with call bell/phone within reach;with bed alarm set;with nursing/sitter in room Nurse Communication: Mobility  status PT Visit Diagnosis: Muscle weakness (generalized) (M62.81);Difficulty in walking, not elsewhere classified (R26.2);Unsteadiness on feet (R26.81)    Time: 0761-5183 PT Time Calculation (min) (ACUTE ONLY): 29 min   Charges:   PT Evaluation $PT Eval Moderate Complexity: 1 Mod PT Treatments $Therapeutic Activity: 8-22 mins        Verner Mould, DPT Acute Rehabilitation Services Office [phone] Pager [phone]   Jacques Navy 04/08/2021, 2:17 PM

## 2021-04-08 NOTE — Consult Note (Signed)
[facility] Infect[facility]    Date of Admission:  04/09/2021     Reason for Consult: chronic sacral ulcer and abd wound/ams     Referring Provider: Florencia Reasons   Lines:  Peripheral iv G-tube  Abx: 9/28-c Vanc 9/28-c  meropenem       Assessment: 87 yo female with chf, hx dvt/pe, cad, rheumatoid arthritis/ILD/chronic hypoxemic respiratory failure on chronic low dose prednisone, chart hx guttate psoriasis, chronic failure to thrive, hx small bowel obstruction s/p exlap/detorsion small bowel 10/2351, complicated by nonhealing abd wound since surgery  There is abd wound swab cx which is showing gpc 9/27 bcx negative  I reviewed previous photo of the abd wound and what she appears now. This is clearly an artificial/iatrogenic shape (rectangular superfical skin sloughing/marceration) with surrounding satellite erythematous papules suggestive of skin irritant/yeast infection due to chronic oozing complicated by occluded skin.   Ct scan showed no abd wall edema/inflammatory changes  With regard to the sacral ulcer, the opening is small and there is no surrounding sign of soft tissue infection either  I would consider checking a blood gas and assess other factors for her presentation. Her bicarb was slightly elevated above baseline but improved on HD#1. However, I have high level of suspicion that she is at baseline demented and the AMS is sundowning/intermittent delirium. Overall this patient appears to have chronic failure to thrive, inability to care for herself, and complete lack of insight. Her albumin is chronically in the low 2s. Her abd wound hasn't healed for more than half a year. She has chronic sacral decub and ct scan "chronic OM" of the ischium. She hasn't been by herself to monitor her abd wound. She is likely bed bound most of the time. This is despite haviing the tube feed in place previously.   Overall poor prognosis in setting of ftt/severe malnutrition/bed  bound status, likely moderate dementia, chronic prednisone use for her interstitial lung disease/rheumatoid arthritis. She is at risk for reucrrent pneumonia, DVT's, skin break down, and anticipated inappropriate use of antibiotics which comes with cdiff complication and toxicity  Agree with aggressive goals of care/pallliative discussion  At this time would hold abx  Plan: Stop vanc/meropenem Wound care/nystatin powder, let wound be exposed to air.  Continue wound care/off loading sacral ulcer Consider ongoing goals of care discussion  Discussed with primary team ID will sign off   I spent 60 minute reviewing data/chart, and coordinating care and >50% direct face to face time providing counseling/discussing diagnostics/treatment plan with patient      ------------------------------------------------ Principal Problem:   AMS (altered mental status) Active Problems:   Chronic diastolic CHF (congestive heart failure) (HCC)   Chronic osteomyelitis of sacrum (HCC)   Fecal impaction (HCC)   Leukocytosis   Chronic wound infection of abdomen    HPI: [name] [name] is a 87 y.o. female chf, hx dvt/pe, cad, rheumatoid arthritis/ILD/chronic hypoxemic respiratory failure on chronic low dose prednisone, chart hx guttate psoriasis, chronic failure to thrive, hx small bowel obstruction s/p exlap/detorsion small bowel 12/1441, complicated by nonhealing abd wound since surgery   She lives in a nursing home I don't know what baseline status for her is Family had reported 1 week of confusion   Chart reviewed Prior exlap 08/2020 for sbo; a peg was placed at that time  Since then struggled with chronic nonhealing wound  Unclear how long she has been staying in nursing home  At some  point recently the peg was removed   Reports of oozing at peg site  At this time patient is alert but confused. Said she felt fine. Denies any focal pain/discomfort, even when asked about local sx at abdomen  area where the wound is  She had no fever on presetnation Mild leukocytosis 16 in setting chronic prednisone use Albumin stable low 2's  Bcx ngtd Wound cx with gpc Started on vanc/meropenem  Nothing changed despite abx    Family History  Problem Relation Age of Onset   Kidney disease Daughter 11   Cancer Daughter    Heart attack Father 30   Alzheimer's disease Sister    Heart disease Sister    Alzheimer's disease Sister     Social History   Tobacco Use   Smoking status: Former    Packs/day: 0.50    Years: 20.00    Pack years: 10.00    Types: Cigarettes    Quit date: 07/11/1973    Years since quitting: 47.7   Smokeless tobacco: Never  Vaping Use   Vaping Use: Never used  Substance Use Topics   Alcohol use: No   Drug use: No    Allergies  Allergen Reactions   Crestor [Rosuvastatin] Other (See Comments)    Muscle pain   Lactose Intolerance (Gi) Other (See Comments)    Stomach upset   Penicillins Hives   Imuran [Azathioprine] Nausea And Vomiting    Review of Systems: ROS All Other ROS was negative, except mentioned above   Past Medical History:  Diagnosis Date   Allergic rhinitis    Allergy    CAD (coronary artery disease)    Mild CAD by cath 2008   CHF (congestive heart failure) (HCC)    DJD (degenerative joint disease)    rheumatoid   DVT (deep venous thrombosis) (HCC)    Dyslipidemia    History of echocardiogram    Echo 12/2018: EF 60-65, mod asymmetric LVH, Gr 1 DD   History of nuclear stress test    Myoview 5/17:  EF 74%, normal perfusion, low risk // Myoview 12/2018:  EF 89, very mild ischemia in inferoapical wall; Low Risk   History of pulmonary embolism    Hyperlipidemia    Insomnia    Osteoporosis    Psoriasis    Pulmonary fibrosis (HCC)    Rheumatoid arthritis (HCC)        Scheduled Meds:  atorvastatin  10 mg Oral Daily   dextrose  25 g Intravenous STAT   docusate sodium  100 mg Oral Daily   enoxaparin (LOVENOX) injection  30 mg  Subcutaneous Q24H   famotidine  20 mg Oral Daily   lipase/protease/amylase  36,000 Units Oral TID AC   midodrine  5 mg Oral TID WC   mirtazapine  15 mg Oral QHS   potassium chloride  40 mEq Oral Once   Continuous Infusions:  lactated ringers 50 mL/hr at 04/07/21 0923   magnesium sulfate bolus IVPB     meropenem (MERREM) IV     vancomycin     PRN Meds:.acetaminophen **OR** acetaminophen, HYDROmorphone (DILAUDID) injection, ondansetron **OR** ondansetron (ZOFRAN) IV, oxyCODONE   OBJECTIVE: Blood pressure (!) 103/51, pulse (!) 102, temperature 98.3 F (36.8 C), temperature source Oral, resp. rate 18, height 5\' 4"  (1.626 m), SpO2 97 %.  Physical Exam  General/constitutional: no distress, pleasant HEENT: Normocephalic, PER, Conj Clear, EOMI, Oropharynx clear Neck supple CV: rrr no mrg Lungs: clear to auscultation, normal respiratory effort Abd: Soft, Nontender  Ext: no edema Skin: open midline abd wound, around previous peg tube site slight serous/fibrinous oozing. There is a rectangular well demarcated skin maceration but the base appears healthy without purulence. There is satellite erythematous papules around the wound without. Nontender overall Reviewed sacral decub ulcer from photos on admission Neuro: nonfocal MSK: no peripheral joint swelling/tenderness/warmth; back spines nontender - scars anterior to bilateral knees  Psych: alert but confused. cooperative   Lab Results Lab Results  Component Value Date   WBC 16.3 (H) 04/08/2021   HGB 9.5 (L) 04/08/2021   HCT 30.1 (L) 04/08/2021   MCV 94.4 04/08/2021   PLT 244 04/08/2021    Lab Results  Component Value Date   CREATININE 0.48 04/08/2021   BUN 11 04/08/2021   NA 140 04/08/2021   K 3.2 (L) 04/08/2021   CL 105 04/08/2021   CO2 29 04/08/2021    Lab Results  Component Value Date   ALT 10 03/20/2021   AST 16 03/25/2021   ALKPHOS 120 04/05/2021   BILITOT 0.7 03/24/2021      Microbiology: Recent Results (from  the past 240 hour(s))  Culture, blood (Routine x 2)     Status: None (Preliminary result)   Collection Time: 03/13/2021 10:16 PM   Specimen: BLOOD LEFT FOREARM  Result Value Ref Range Status   Specimen Description   Final    BLOOD LEFT FOREARM Performed at [facility], [place] [address], [place], [place] [address]    Special Requests   Final    BOTTLES DRAWN AEROBIC AND ANAEROBIC Blood Culture results may not be optimal due to an excessive volume of blood received in culture bottles Performed at [facility] [address], [place], [place] [address]    Culture   Final    NO GROWTH < 12 HOURS Performed at [facility] [address] [place], [place] [address]    Report Status PENDING  Incomplete  Resp Panel by RT-PCR (Flu A&B, Covid) Nasopharyngeal Swab     Status: None   Collection Time: 03/24/2021 10:41 PM   Specimen: Nasopharyngeal Swab; Nasopharyngeal(NP) swabs in vial transport medium  Result Value Ref Range Status   SARS Coronavirus 2 by RT PCR NEGATIVE NEGATIVE Final    Comment: (NOTE) SARS-CoV-2 target nucleic acids are NOT DETECTED.  The SARS-CoV-2 RNA is generally detectable in upper respiratory specimens during the acute phase of infection. The lowest concentration of SARS-CoV-2 viral copies this assay can detect is 138 copies/mL. A negative result does not preclude SARS-Cov-2 infection and should not be used as the sole basis for treatment or other patient management decisions. A negative result may occur with  improper specimen collection/handling, submission of specimen other than nasopharyngeal swab, presence of viral mutation(s) within the areas targeted by this assay, and inadequate number of viral copies(<138 copies/mL). A negative result must be combined with clinical observations, patient history, and epidemiological information. The expected result is Negative.  Fact Sheet for Patients:   EntrepreneurPulse.com.au  Fact Sheet for Healthcare Providers:  IncredibleEmployment.be  This test is no t yet approved or cleared by the [place] FDA and  has been authorized for detection and/or diagnosis of SARS-CoV-2 by FDA under an Emergency Use Authorization (EUA). This EUA will remain  in effect (meaning this test can be used) for the duration of the COVID-19 declaration under Section 564(b)(1) of the Act, 21 U.S.C.section 360bbb-3(b)(1), unless the authorization is terminated  or revoked sooner.  Influenza A by PCR NEGATIVE NEGATIVE Final   Influenza B by PCR NEGATIVE NEGATIVE Final    Comment: (NOTE) The Xpert Xpress SARS-CoV-2/FLU/RSV plus assay is intended as an aid in the diagnosis of influenza from Nasopharyngeal swab specimens and should not be used as a sole basis for treatment. Nasal washings and aspirates are unacceptable for Xpert Xpress SARS-CoV-2/FLU/RSV testing.  Fact Sheet for Patients: EntrepreneurPulse.com.au  Fact Sheet for Healthcare Providers: IncredibleEmployment.be  This test is not yet approved or cleared by the [place] FDA and has been authorized for detection and/or diagnosis of SARS-CoV-2 by FDA under an Emergency Use Authorization (EUA). This EUA will remain in effect (meaning this test can be used) for the duration of the COVID-19 declaration under Section 564(b)(1) of the Act, 21 U.S.C. section 360bbb-3(b)(1), unless the authorization is terminated or revoked.  Performed at [facility], [place] [address], [place], [place] [address]   Aerobic/Anaerobic Culture w Gram Stain (surgical/deep wound)     Status: None (Preliminary result)   Collection Time: 04/03/2021 11:23 PM   Specimen: Abdomen; Wound  Result Value Ref Range Status   Specimen Description   Final    ABDOMEN Performed at [facility] [address],  [place], [place] [address]    Special Requests   Final    NONE Performed at [facility], [place] [address] [place], [place] [address]    Gram Stain   Final    FEW SQUAMOUS EPITHELIAL CELLS PRESENT FEW WBC SEEN MODERATE GRAM POSITIVE COCCI Performed at [facility], [place] [address] [place], [place] [address]    Culture PENDING  Incomplete   Report Status PENDING  Incomplete  Aerobic Culture w Gram Stain (superficial specimen)     Status: None (Preliminary result)   Collection Time: 03/21/2021 11:23 PM   Specimen: Abdomen; Wound  Result Value Ref Range Status   Specimen Description   Final    ABDOMEN Performed at [facility] [address] [place], [place] [address]    Special Requests   Final    NONE Performed at [facility], [place] [address], [place], [place] [address]    Gram Stain   Final    NO SQUAMOUS EPITHELIAL CELLS SEEN FEW WBC SEEN NO ORGANISMS SEEN Performed at [facility], [place] [address], [place], [place] [address]    Culture PENDING  Incomplete   Report Status PENDING  Incomplete     Serology:    Imaging: If present, new imagings (plain films, ct scans, and mri) have been personally visualized and interpreted; radiology reports have been reviewed. Decision making incorporated into the Impression / Recommendations.  9/28 abd pelv ct Sacral decubitus ulcer with overlying chronic osteomyelitis involving the coccyx.   Constipation with moderate rectal fecal impaction.   Percutaneous gastrostomy tract along the left mid abdominal wall, without visualization of the gastrostomy tube.   Chronic gallbladder distension with dilated common duct. No intrahepatic ductal dilatation.   Additional stable ancillary findings as above.   9/28 head ct Generalized atrophy and chronic microvascular ischemia without acute intracranial abnormality.   9/27 cxr Chronic infiltrate in keeping with pulmonary  fibrosis. No superimposed acute cardiopulmonary disease.  Jabier Mutton, [facility] [place] [phone] pager    04/08/2021, 9:12 AM

## 2021-04-08 NOTE — Progress Notes (Signed)
Mews yellow d/t pt's elevated HR.  Performed dressing changes and peri-care. Pain medicine administered. Pt resting comfortably.

## 2021-04-08 NOTE — Progress Notes (Signed)
PROGRESS NOTE    [name] [name]  DQQ:[id number] DOB: 1930-11-04 DOA: 04/02/2021 PCP: Hoyt Koch, MD    Chief Complaint  Patient presents with   Altered Mental Status    Brief Narrative:  [name] [name] is a 87 y.o. female with medical history significant for CHF, chronic nonhealing abdominal wound, hx of DVT/PE, CAD, RA sent from SNF due to  altered mental status, per family she was interactive a week ago, but has progressive lethargy for the past week, per family patient had temperature of 98.9  and increased heart rate at snf  Subjective:  Has hypoglycemia this am She appears more alert and more interactive this am Tachycardia and tachypnea has improved Family at bedside   Assessment & Plan:   Principal Problem:   AMS (altered mental status) Active Problems:   Chronic diastolic CHF (congestive heart failure) (HCC)   Chronic osteomyelitis of sacrum (HCC)   Fecal impaction (HCC)   Leukocytosis   Chronic wound infection of abdomen  Acute metabolic encephalopathy From infection?  Dehydration?  Oversedation from pain medication (per family report patient received pain medication at nursing home) She was started on broad spectrum abx on admission due to concerns for osteomyelitis and wound infection Wound swab on admission + staph aureus Blood culture no growth ID consulted She is more alert today  Hypomagnesemia/hypokalemia Replace, recheck in the morning  Hypoglycemia Likely due to poor oral intake, started on D5 LR  Nonhealing abdominal wound/poor nutrition status General surgery consulted, will follow recommendation  Interstitial pulmonary fibrosis/chronic hypoxic respiratory failure on home O2 2 L/immunosuppressed status, appears on chronic prednisone, and CellCept  FTT, poor prognosis, palliative care consulted       Nutritional Assessment:  The patient's BMI is: Body mass index is 17.16 kg/m.[name]  Seen by dietician.  I agree with the assessment  and plan as outlined below:  Nutrition Status:        .     Skin Assessment:  I have examined the patient's skin and I agree with the wound assessment as performed by the wound care RN as outlined below:  Pressure Injury 11/04/20 Sacrum Mid Stage 3 -  Full thickness tissue loss. Subcutaneous fat may be visible but bone, tendon or muscle are NOT exposed. Circular pencil eraser like hole on sacrum; initially documented 09/05/20 as stage 2 pressure area (Active)  11/04/20 1330  Location: Sacrum  Location Orientation: Mid  Staging: Stage 3 -  Full thickness tissue loss. Subcutaneous fat may be visible but bone, tendon or muscle are NOT exposed.  Wound Description (Comments): Circular pencil eraser like hole on sacrum; initially documented 09/05/20 as stage 2 pressure area  Present on Admission: Yes    Unresulted Labs (From admission, onward)    None         DVT prophylaxis: enoxaparin (LOVENOX) injection 30 mg Start: 04/07/21 1000   Code Status:full Family Communication: family at bedside  Disposition:   Status is: Inpatient   Dispo: The patient is from: SNF              Anticipated d/c is to: TBD              Anticipated d/c date is:               Patient currently   Consultants:  General surgery ID Palliative care Wound care  Procedures:  none  Antimicrobials:   Anti-infectives (From admission, onward)    Start  Dose/Rate Route Frequency Ordered Stop   04/08/21 1700  meropenem (MERREM) 1 g in sodium chloride 0.9 % 100 mL IVPB  Status:  Discontinued        1 g 200 mL/hr over 30 Minutes Intravenous Every 12 hours 04/08/21 0750 04/08/21 1125   04/08/21 1200  vancomycin (VANCOREADY) IVPB 500 mg/100 mL  Status:  Discontinued        500 mg 100 mL/hr over 60 Minutes Intravenous Every 24 hours 04/07/21 0717 04/08/21 1125   04/07/21 1200  meropenem (MERREM) 1 g in sodium chloride 0.9 % 100 mL IVPB  Status:  Discontinued        1 g 200 mL/hr over 30 Minutes  Intravenous Every 8 hours 04/07/21 0952 04/08/21 0750   04/07/21 0700  vancomycin (VANCOCIN) IVPB 1000 mg/200 mL premix        1,000 mg 200 mL/hr over 60 Minutes Intravenous  Once 04/07/21 0649 04/07/21 0758           Objective: Vitals:   04/07/21 2230 04/08/21 0416 04/08/21 0615 04/08/21 0816  BP: (!) 97/52 (!) 97/54 (!) 99/59 (!) 103/51  Pulse: 98 (!) 108 (!) 102 (!) 102  Resp: 20 20 18 18   Temp: 98 F (36.7 C) 97.6 F (36.4 C) 98 F (36.7 C) 98.3 F (36.8 C)  TempSrc:    Oral  SpO2: 95% 100% 99% 97%  Height:        Intake/Output Summary (Last 24 hours) at 04/08/2021 0946 Last data filed at 04/08/2021 0313 Gross per 24 hour  Intake 1078.84 ml  Output --  Net 1078.84 ml   There were no vitals filed for this visit.  Examination:  General exam: frail, weak, appear more alert and more interactive this am, not oriented to place or time, but cooperative Respiratory system: diminished at basis, Respiratory effort normal. Cardiovascular system: S1 & S2 heard, RRR. [name] Gastrointestinal system: Abdomen is nondistended, soft , abdominal wound, see pic below, + bowel sounds  Central nervous system: more Alert and oriented to person only Extremities: no edema Skin: sacral ulcer, abdominal wound Psychiatry: calm and cooperative, no agitation.       Data Reviewed: I have personally reviewed following labs and imaging studies  CBC: Recent Labs  Lab 04/08/2021 2216 04/08/21 0436  WBC 16.0* 16.3*  NEUTROABS 9.7*  --   HGB 12.6 9.5*  HCT 41.3 30.1*  MCV 95.6 94.4  PLT 328 631    Basic Metabolic Panel: Recent Labs  Lab 04/01/2021 2216 04/08/21 0436  NA 146* 140  K 4.0 3.2*  CL 104 105  CO2 33* 29  GLUCOSE 106* 66*  BUN 15 11  CREATININE 0.73 0.48  CALCIUM 9.6 8.2*  MG  --  1.6*    GFR: CrCl cannot be calculated (Unknown ideal weight.).  Liver Function Tests: Recent Labs  Lab 03/23/2021 2216  AST 16  ALT 10  ALKPHOS 120  BILITOT 0.7  PROT 7.7  ALBUMIN  2.5*    CBG: Recent Labs  Lab 04/08/21 0908  GLUCAP 51*     Recent Results (from the past 240 hour(s))  Culture, blood (Routine x 2)     Status: None (Preliminary result)   Collection Time: 03/23/2021 10:16 PM   Specimen: BLOOD LEFT FOREARM  Result Value Ref Range Status   Specimen Description   Final    BLOOD LEFT FOREARM Performed at [facility] [address] [place], [place] [address]    Special Requests  Final    BOTTLES DRAWN AEROBIC AND ANAEROBIC Blood Culture results may not be optimal due to an excessive volume of blood received in culture bottles Performed at [facility] [address], [place], [place] [address]    Culture   Final    NO GROWTH < 12 HOURS Performed at [facility] [address] [place], [place] [address]    Report Status PENDING  Incomplete  Resp Panel by RT-PCR (Flu A&B, Covid) Nasopharyngeal Swab     Status: None   Collection Time: 03/28/2021 10:41 PM   Specimen: Nasopharyngeal Swab; Nasopharyngeal(NP) swabs in vial transport medium  Result Value Ref Range Status   SARS Coronavirus 2 by RT PCR NEGATIVE NEGATIVE Final    Comment: (NOTE) SARS-CoV-2 target nucleic acids are NOT DETECTED.  The SARS-CoV-2 RNA is generally detectable in upper respiratory specimens during the acute phase of infection. The lowest concentration of SARS-CoV-2 viral copies this assay can detect is 138 copies/mL. A negative result does not preclude SARS-Cov-2 infection and should not be used as the sole basis for treatment or other patient management decisions. A negative result may occur with  improper specimen collection/handling, submission of specimen other than nasopharyngeal swab, presence of viral mutation(s) within the areas targeted by this assay, and inadequate number of viral copies(<138 copies/mL). A negative result must be combined with clinical observations, patient history, and epidemiological information.  The expected result is Negative.  Fact Sheet for Patients:  EntrepreneurPulse.com.au  Fact Sheet for Healthcare Providers:  IncredibleEmployment.be  This test is no t yet approved or cleared by the [place] FDA and  has been authorized for detection and/or diagnosis of SARS-CoV-2 by FDA under an Emergency Use Authorization (EUA). This EUA will remain  in effect (meaning this test can be used) for the duration of the COVID-19 declaration under Section 564(b)(1) of the Act, 21 U.S.C.section 360bbb-3(b)(1), unless the authorization is terminated  or revoked sooner.       Influenza A by PCR NEGATIVE NEGATIVE Final   Influenza B by PCR NEGATIVE NEGATIVE Final    Comment: (NOTE) The Xpert Xpress SARS-CoV-2/FLU/RSV plus assay is intended as an aid in the diagnosis of influenza from Nasopharyngeal swab specimens and should not be used as a sole basis for treatment. Nasal washings and aspirates are unacceptable for Xpert Xpress SARS-CoV-2/FLU/RSV testing.  Fact Sheet for Patients: EntrepreneurPulse.com.au  Fact Sheet for Healthcare Providers: IncredibleEmployment.be  This test is not yet approved or cleared by the [place] FDA and has been authorized for detection and/or diagnosis of SARS-CoV-2 by FDA under an Emergency Use Authorization (EUA). This EUA will remain in effect (meaning this test can be used) for the duration of the COVID-19 declaration under Section 564(b)(1) of the Act, 21 U.S.C. section 360bbb-3(b)(1), unless the authorization is terminated or revoked.  Performed at [facility], [place] [address], [place], [place] [address]   Aerobic/Anaerobic Culture w Gram Stain (surgical/deep wound)     Status: None (Preliminary result)   Collection Time: 03/23/2021 11:23 PM   Specimen: Abdomen; Wound  Result Value Ref Range Status   Specimen Description   Final     ABDOMEN Performed at [facility] [address], [place],  [address]    Special Requests   Final    NONE Performed at [facility], [place] [address], [place], [place] [address]    Gram Stain   Final    FEW SQUAMOUS EPITHELIAL CELLS  PRESENT FEW WBC SEEN MODERATE GRAM POSITIVE COCCI Performed at [facility], [place] [address], [place], [place] [address]    Culture PENDING  Incomplete   Report Status PENDING  Incomplete  Aerobic Culture w Gram Stain (superficial specimen)     Status: None (Preliminary result)   Collection Time: 03/19/2021 11:23 PM   Specimen: Abdomen; Wound  Result Value Ref Range Status   Specimen Description   Final    ABDOMEN Performed at [facility] [address], [place], [place] [address]    Special Requests   Final    NONE Performed at [facility], [place] [address], [place], [place] [address]    Gram Stain   Final    NO SQUAMOUS EPITHELIAL CELLS SEEN FEW WBC SEEN NO ORGANISMS SEEN Performed at [facility], [place] [address] [place], [place] [address]    Culture PENDING  Incomplete   Report Status PENDING  Incomplete         Radiology Studies: DG Chest 2 View  Result Date: 04/01/2021 CLINICAL DATA:  Sepsis EXAM: CHEST - 2 VIEW COMPARISON:  03/22/2021 FINDINGS: Chronic, extensive bilateral lower lung zone predominant peripheral reticular infiltrate is again seen and appears stable in keeping with underlying pulmonary fibrosis, better appreciated on CT examination of 03/26/2021. No superimposed focal pulmonary infiltrate. No pneumothorax or pleural effusion. Cardiac size within normal limits. No acute bone abnormality. IMPRESSION: Chronic infiltrate in keeping with pulmonary fibrosis. No superimposed acute cardiopulmonary disease. Electronically Signed   By: Fidela Salisbury M.D.   On: 04/09/2021 23:14   CT HEAD WO CONTRAST (5MM)  Result Date: 04/07/2021 CLINICAL  DATA:  Encephalopathy EXAM: CT HEAD WITHOUT CONTRAST TECHNIQUE: Contiguous axial images were obtained from the base of the skull through the vertex without intravenous contrast. COMPARISON:  03/22/2021 FINDINGS: Brain: There is no mass, hemorrhage or extra-axial collection. There is generalized atrophy without lobar predilection. Hypodensity of the white matter is most commonly associated with chronic microvascular disease. Vascular: No abnormal hyperdensity of the major intracranial arteries or dural venous sinuses. No intracranial atherosclerosis. Skull: The visualized skull base, calvarium and extracranial soft tissues are normal. Sinuses/Orbits: No fluid levels or advanced mucosal thickening of the visualized paranasal sinuses. No mastoid or middle ear effusion. The orbits are normal. IMPRESSION: Generalized atrophy and chronic microvascular ischemia without acute intracranial abnormality. Electronically Signed   By: Ulyses Jarred M.D.   On: 04/07/2021 00:34   CT ABDOMEN PELVIS W CONTRAST  Result Date: 04/07/2021 CLINICAL DATA:  Diffuse abdominal pain, gastrostomy tube not in place, chronic nonhealing wound EXAM: CT ABDOMEN AND PELVIS WITH CONTRAST TECHNIQUE: Multidetector CT imaging of the abdomen and pelvis was performed using the standard protocol following bolus administration of intravenous contrast. CONTRAST:  3mL OMNIPAQUE IOHEXOL 350 MG/ML SOLN COMPARISON:  10/24/2020 FINDINGS: Lower chest: Subpleural reticulation/fibrosis in the visualized lungs, suggesting chronic interstitial lung disease, grossly unchanged. Hepatobiliary: Liver is within normal limits. Distended gallbladder, chronic. No intrahepatic ductal dilatation. Dilated common duct, measuring 16 mm (coronal image 38), chronic. Pancreas: Within normal limits. Spleen: Within normal limits. Adrenals/Urinary Tract: Adrenal glands are within normal limits. Kidneys are within normal limits.  No hydronephrosis. Bladder is within normal limits.  Stomach/Bowel: Stomach is notable for a percutaneous gastrostomy tract along the left anterior abdominal wall (series 2/image 41) without indwelling gastrostomy tube. No evidence of bowel obstruction. Appendix is not discretely visualized. Scattered left colonic diverticulosis, without evidence of diverticulitis. Moderate left colonic stool burden, suggesting  constipation. Moderate to large rectal stool burden (series 2/image 68), suggesting fecal impaction. Vascular/Lymphatic: No evidence of abdominal aortic aneurysm. Atherosclerotic calcifications of the abdominal aorta and branch vessels. No suspicious abdominopelvic lymphadenopathy. Reproductive: Status post hysterectomy. No adnexal masses. Other: No abdominopelvic ascites. Musculoskeletal: Mild soft tissue thickening with a subcutaneous tract in the left medial gluteal region (series 2/image 70), extending to the coccyx, suggesting a sacral decubitus ulcer with overlying chronic osteomyelitis (sagittal image 82). Mild degenerative changes of the lower lumbar spine. IMPRESSION: Sacral decubitus ulcer with overlying chronic osteomyelitis involving the coccyx. Constipation with moderate rectal fecal impaction. Percutaneous gastrostomy tract along the left mid abdominal wall, without visualization of the gastrostomy tube. Chronic gallbladder distension with dilated common duct. No intrahepatic ductal dilatation. Additional stable ancillary findings as above. Electronically Signed   By: Julian Hy M.D.   On: 04/07/2021 01:05        Scheduled Meds:  atorvastatin  10 mg Oral Daily   docusate sodium  100 mg Oral Daily   enoxaparin (LOVENOX) injection  30 mg Subcutaneous Q24H   famotidine  20 mg Oral Daily   glucagon (human recombinant)       glucagon (human recombinant)  1 mg Intramuscular STAT   lipase/protease/amylase  36,000 Units Oral TID AC   midodrine  5 mg Oral TID WC   mirtazapine  15 mg Oral QHS   Continuous Infusions:  dextrose 5%  lactated ringers with KCl 20 mEq/L     magnesium sulfate bolus IVPB     meropenem (MERREM) IV     potassium chloride     vancomycin       LOS: 1 day   Time spent: 49mins, case discussed with general surgery, ID, palliative care in person Greater than 50% of this time was spent in counseling, explanation of diagnosis, planning of further management, and coordination of care.   Voice Recognition Viviann Spare dictation system was used to create this note, attempts have been made to correct errors. Please contact the author with questions and/or clarifications.   Florencia Reasons, MD PhD FACP [facility]  Available via Epic secure chat 7am-7pm for nonurgent issues Please page for urgent issues To page the attending provider between 7A-7P or the covering provider during after hours 7P-7A, please log into the web site [email or web address] and access using universal [place] password for that web site. If you do not have the password, please call the hospital operator.    04/08/2021, 9:46 AM

## 2021-04-09 DIAGNOSIS — R531 Weakness: Secondary | ICD-10-CM

## 2021-04-09 DIAGNOSIS — Z515 Encounter for palliative care: Secondary | ICD-10-CM

## 2021-04-09 DIAGNOSIS — D72829 Elevated white blood cell count, unspecified: Secondary | ICD-10-CM | POA: Diagnosis not present

## 2021-04-09 DIAGNOSIS — G9341 Metabolic encephalopathy: Secondary | ICD-10-CM | POA: Diagnosis not present

## 2021-04-09 DIAGNOSIS — M8668 Other chronic osteomyelitis, other site: Secondary | ICD-10-CM | POA: Diagnosis not present

## 2021-04-09 DIAGNOSIS — Z7189 Other specified counseling: Secondary | ICD-10-CM | POA: Diagnosis not present

## 2021-04-09 DIAGNOSIS — I5032 Chronic diastolic (congestive) heart failure: Secondary | ICD-10-CM | POA: Diagnosis not present

## 2021-04-09 DIAGNOSIS — T8189XA Other complications of procedures, not elsewhere classified, initial encounter: Secondary | ICD-10-CM

## 2021-04-09 LAB — CBC
HCT: 30.9 % — ABNORMAL LOW (ref 36.0–46.0)
Hemoglobin: 9.5 g/dL — ABNORMAL LOW (ref 12.0–15.0)
MCH: 29.1 pg (ref 26.0–34.0)
MCHC: 30.7 g/dL (ref 30.0–36.0)
MCV: 94.8 fL (ref 80.0–100.0)
Platelets: 239 10*3/uL (ref 150–400)
RBC: 3.26 MIL/uL — ABNORMAL LOW (ref 3.87–5.11)
RDW: 14.5 % (ref 11.5–15.5)
WBC: 16.8 10*3/uL — ABNORMAL HIGH (ref 4.0–10.5)
nRBC: 0 % (ref 0.0–0.2)

## 2021-04-09 LAB — COMPREHENSIVE METABOLIC PANEL WITH GFR
ALT: 9 U/L (ref 0–44)
AST: 22 U/L (ref 15–41)
Albumin: 1.9 g/dL — ABNORMAL LOW (ref 3.5–5.0)
Alkaline Phosphatase: 101 U/L (ref 38–126)
Anion gap: 5 (ref 5–15)
BUN: 7 mg/dL — ABNORMAL LOW (ref 8–23)
CO2: 31 mmol/L (ref 22–32)
Calcium: 8.5 mg/dL — ABNORMAL LOW (ref 8.9–10.3)
Chloride: 108 mmol/L (ref 98–111)
Creatinine, Ser: 0.49 mg/dL (ref 0.44–1.00)
GFR, Estimated: >60 mL/min
Glucose, Bld: 111 mg/dL — ABNORMAL HIGH (ref 70–99)
Potassium: 3.7 mmol/L (ref 3.5–5.1)
Sodium: 144 mmol/L (ref 135–145)
Total Bilirubin: 0.6 mg/dL (ref 0.3–1.2)
Total Protein: 5.6 g/dL — ABNORMAL LOW (ref 6.5–8.1)

## 2021-04-09 LAB — MAGNESIUM: Magnesium: 1.8 mg/dL (ref 1.7–2.4)

## 2021-04-09 MED ORDER — POLYETHYLENE GLYCOL 3350 17 G PO PACK
17.0000 g | PACK | Freq: Every day | ORAL | Status: DC
  Administered 2021-04-10 – 2021-04-14 (×2): 17 g via ORAL
  Filled 2021-04-09 (×3): qty 1

## 2021-04-09 MED ORDER — DIPHENHYDRAMINE-ZINC ACETATE 2-0.1 % EX CREA
TOPICAL_CREAM | Freq: Two times a day (BID) | CUTANEOUS | Status: DC | PRN
  Filled 2021-04-09: qty 28

## 2021-04-09 NOTE — Progress Notes (Signed)
Daily Progress Note   Patient Name: [name] [name]       Date: 04/09/2021 DOB: Mar 19, 1931  Age: 87 y.o. MRN#: [id number] Attending Physician: Florencia Reasons, MD Primary Care Physician: Hoyt Koch, MD Admit Date: 03/12/2021  Reason for Consultation/Follow-up: Establishing goals of care  Subjective:  Awake alert resting in bed Responds appropriately to some questions, not that much PO intake today, appreciate bedside nursing.   Length of Stay: 2  Current Medications: Scheduled Meds:   atorvastatin  10 mg Oral Daily   docusate sodium  100 mg Oral Daily   enoxaparin (LOVENOX) injection  30 mg Subcutaneous Q24H   famotidine  20 mg Oral Daily   lipase/protease/amylase  36,000 Units Oral TID AC   midodrine  5 mg Oral TID WC   mirtazapine  15 mg Oral QHS   nystatin   Topical TID   potassium chloride  20 mEq Oral Once   silver sulfADIAZINE   Topical Daily    Continuous Infusions:   PRN Meds: acetaminophen **OR** acetaminophen, diphenhydrAMINE-zinc acetate, ondansetron **OR** ondansetron (ZOFRAN) IV, oxyCODONE  Physical Exam         Awake alert Appears frail Regular work of breathing Abdominal images viewed on chart Interactive No distress  Vital Signs: BP 116/77 (BP Location: Left Arm)   Pulse 96   Temp 98 F (36.7 C) (Oral)   Resp 16   Ht 5\' 4"  (1.626 m)   SpO2 98%   BMI 17.16 kg/m  SpO2: SpO2: 98 % O2 Device: O2 Device: Nasal Cannula O2 Flow Rate: O2 Flow Rate (L/min): 2 L/min  Intake/output summary:  Intake/Output Summary (Last 24 hours) at 04/09/2021 1403 Last data filed at 04/08/2021 1600 Gross per 24 hour  Intake 614.16 ml  Output --  Net 614.16 ml   LBM: Last BM Date: 04/09/21 Baseline Weight:   Most recent weight:         Palliative  Assessment/Data:      Patient Active Problem List   Diagnosis Date Noted   Nonhealing surgical wound, initial encounter    Palliative care by specialist    Goals of care, counseling/discussion    General weakness    Chronic dermatitis    AMS (altered mental status) 04/07/2021   Chronic osteomyelitis of sacrum ([place]) 04/07/2021   Fecal impaction ([place])  04/07/2021   Leukocytosis 04/07/2021   Chronic wound infection of abdomen 04/07/2021   Debility 11/04/2020   Dysphagia    Oral thrush    Encounter for dental examination    Facial swelling    Atrophy of edentulous alveolar ridge    Abdominal wall cellulitis 10/24/2020   Severe sepsis ([place]) 10/23/2020   Abdominal pain 10/20/2020   Diarrhea 10/02/2020   Pressure injury of skin 09/06/2020   Acute on chronic respiratory failure with hypoxemia (HCC) 08/23/2020   Protein-calorie malnutrition, severe 08/20/2020   Fall 01/25/2020   Other fatigue 01/03/2020   Left shoulder pain 01/03/2020   Urinary frequency 01/03/2020   Acute otalgia, left 09/06/2019   Unintentional weight loss 07/18/2019   Personal history of PE (pulmonary embolism) 04/23/2019   Headache 02/11/2019   Iron deficiency anemia 12/21/2018   Cold sore 10/23/2018   Blood in stool 09/14/2018   Guttate psoriasis 07/19/2018   Therapeutic drug monitoring 07/17/2018   Chronic diastolic CHF (congestive heart failure) ([place]) 12/19/2017   Rash 08/11/2017   Chronic respiratory failure with hypoxia ([place]) 03/30/2017   Angular cheilitis 03/08/2017   Leg pain 10/25/2016   Back pain 08/05/2016   RA (rheumatoid arthritis) ([place]) 03/31/2015   Allergic rhinitis    Varicose vein 09/11/2014   ILD (interstitial lung disease) ([place]) 06/25/2012   Chest pain 02/27/2012   CAD (coronary artery disease) 02/27/2012   Pruritus 08/18/2009   Postinflammatory pulmonary fibrosis / RA ILD  06/30/2008   Constipation 01/03/2008   Dyslipidemia 06/16/2007    Palliative Care Assessment & Plan    Patient Profile:  87 year old lady known to palliative medicine service from previous hospitalization, see surgery in outpatient setting.  Small bowel obstruction in February for which he underwent ex lap with detorsion of her small bowel alongside placement of G-tube.  Prolonged postoperative course after that and was discharged to SNF.  Several recurrent admissions for weakness failure to thrive as well as leakage around her G-tube.  G-tube replaced by interventional radiology in July for concern for clogging.  Patient removed G-tube in August and has had a significant excoriation and wound secondary to leakage.  Patient admitted to hospital medicine service for abdominal wound and failure to thrive. Chart reviewed.  Patient is seen by [facility] palliative staff at her nursing facility.  Her CODE STATUS is full code.  She has been seen and evaluated by general surgery as well as infectious disease and speech therapy. Palliative medicine consultation has been requested for CODE STATUS and goals of care discussions.  Assessment:  Leakage at prior gastrostomy site Generalized weakness Generalized pain  Recommendations/Plan:  Discussed with TRH MD, call placed and discussed with [name]: patient's loved ones are all in favor of proceeding with PEG placement by IR, as a palliative measure, to avoid further leakage and skin damage, and then to also use it for artificial nutrition.  Continue current mode of care No further PMT specific recommendations at this time Recommend continuation of outpatient palliative care support that the patient has.   Goals of Care and Additional Recommendations: Limitations on Scope of Treatment: Full Scope Treatment  Code Status:    Code Status Orders  (From admission, onward)           Start     Ordered   04/07/21 0645  Full code  Continuous        04/07/21 0644           Code Status History  Date Active Date Inactive Code Status  Order ID Comments User Context   11/04/2020 1251 11/23/2020 1545 Full Code [id number]  Elizabeth Sauer Inpatient   11/04/2020 1251 11/04/2020 1251 Full Code [id number]  Elizabeth Sauer Inpatient   10/23/2020 1937 11/04/2020 1246 Full Code [id number]  Patrecia Pour, MD Inpatient   08/17/2020 1413 09/08/2020 1521 Full Code [id number]  Jonnie Finner, DO ED   01/25/2020 0454 01/27/2020 1819 Full Code [id number]  Chauncey Mann, MD Inpatient   06/17/2013 0948 06/19/2013 1517 Full Code [id number]  Burtis Junes, NP Inpatient   12/31/2012 1902 01/02/2013 1526 Full Code [id number]  Elsie Stain, MD Inpatient   12/31/2012 1736 12/31/2012 1902 Full Code [id number]  Juanito Doom, MD Outpatient       Prognosis:  Unable to determine  Discharge Planning: [place] for rehab with Palliative care service follow-up  Care plan was discussed with  patient, RN, TRH MD and nephew.   Thank you for allowing the Palliative Medicine Team to assist in the care of this patient.   Time In: 1300 Time Out: 1325 Total Time 25 Prolonged Time Billed  no       Greater than 50%  of this time was spent counseling and coordinating care related to the above assessment and plan.  Loistine Chance, MD  Please contact Palliative Medicine Team phone at [phone] for questions and concerns.

## 2021-04-09 NOTE — Progress Notes (Signed)
PROGRESS NOTE    [name] [name]  HWE:[id number] DOB: 10/04/1930 DOA: 04/08/2021 PCP: Hoyt Koch, MD    Chief Complaint  Patient presents with   Altered Mental Status    Brief Narrative:  [name] [name] is a 87 y.o. female with medical history significant for CHF, chronic nonhealing abdominal wound, hx of DVT/PE, CAD, RA sent from SNF due to  altered mental status, per family she was interactive a week ago, but has progressive lethargy for the past week, per family patient had temperature of 98.9  and increased heart rate at snf  Subjective:  She is alert and interactive, reports feeling better, denies pain She knows she is in the hospital, not able to name the hospital, she is not oriented to time  Tachycardia and tachypnea has resolved    Assessment & Plan:   Principal Problem:   AMS (altered mental status) Active Problems:   Chronic diastolic CHF (congestive heart failure) (HCC)   Chronic osteomyelitis of sacrum (HCC)   Fecal impaction (HCC)   Leukocytosis   Chronic wound infection of abdomen   Chronic dermatitis  Acute metabolic encephalopathy From infection?  Dehydration?  Oversedation from pain medication (per family report patient received pain medication at nursing home) She was started on broad spectrum abx on admission due to concerns for osteomyelitis and wound infection Wound swab on admission + staph aureus Blood culture no growth ID consulted who recommended stop all abx as risk overweight benefit, please see ID Dr Hart [place] note from 9/29 She is more alert today, family reports her mental status is closer to baseline ( at baseline, family always remind her about the time)  Hypomagnesemia/hypokalemia Replaced, improved  Hypoglycemia Likely due to poor oral intake, started on D5 LR  Nonhealing abdominal wound/poor nutrition status General surgery consulted who recommended wound care, and replace peg tube by IR  Interstitial pulmonary  fibrosis/chronic hypoxic respiratory failure on home O2 2 L/immunosuppressed status, appears on chronic prednisone, and CellCept  FTT, poor prognosis, palliative care consulted , family decided to proceed with replace peg tube then snf placement with outpatient palliative care      Nutritional Assessment:  The patient's BMI is: Body mass index is 17.16 kg/m.[name]  Seen by dietician.  I agree with the assessment and plan as outlined below:  Nutrition Status:        .     Skin Assessment:  I have examined the patient's skin and I agree with the wound assessment as performed by the wound care RN as outlined below:  Pressure Injury 11/04/20 Sacrum Mid Stage 3 -  Full thickness tissue loss. Subcutaneous fat may be visible but bone, tendon or muscle are NOT exposed. Circular pencil eraser like hole on sacrum; initially documented 09/05/20 as stage 2 pressure area (Active)  11/04/20 1330  Location: Sacrum  Location Orientation: Mid  Staging: Stage 3 -  Full thickness tissue loss. Subcutaneous fat may be visible but bone, tendon or muscle are NOT exposed.  Wound Description (Comments): Circular pencil eraser like hole on sacrum; initially documented 09/05/20 as stage 2 pressure area  Present on Admission: Yes    Unresulted Labs (From admission, onward)     Start     Ordered   04/10/21 0500  CBC with Differential/Platelet  Tomorrow morning,   R       Question:  Specimen collection method  Answer:  Lab=Lab collect   04/09/21 0927   04/10/21 8938  Basic metabolic panel  Tomorrow  morning,   R       Question:  Specimen collection method  Answer:  Lab=Lab collect   04/09/21 0927   04/10/21 0500  Magnesium  Tomorrow morning,   R       Question:  Specimen collection method  Answer:  Lab=Lab collect   04/09/21 0927   04/09/21 0928  Magnesium  Add-on,   AD       Question:  Specimen collection method  Answer:  Lab=Lab collect   04/09/21 0927              DVT prophylaxis:  enoxaparin (LOVENOX) injection 30 mg Start: 04/07/21 1000   Code Status:full Family Communication: nephew [name] over the phone  Disposition:   Status is: Inpatient   Dispo: The patient is from: SNF              Anticipated d/c is to: SNF ( family [facility])              Anticipated d/c date is: after peg placement, then SNF placement                Consultants:  General surgery ID Palliative care Wound care IR  Procedures:  none  Antimicrobials:   Anti-infectives (From admission, onward)    Start     Dose/Rate Route Frequency Ordered Stop   04/08/21 1700  meropenem (MERREM) 1 g in sodium chloride 0.9 % 100 mL IVPB  Status:  Discontinued        1 g 200 mL/hr over 30 Minutes Intravenous Every 12 hours 04/08/21 0750 04/08/21 1125   04/08/21 1200  vancomycin (VANCOREADY) IVPB 500 mg/100 mL  Status:  Discontinued        500 mg 100 mL/hr over 60 Minutes Intravenous Every 24 hours 04/07/21 0717 04/08/21 1125   04/07/21 1200  meropenem (MERREM) 1 g in sodium chloride 0.9 % 100 mL IVPB  Status:  Discontinued        1 g 200 mL/hr over 30 Minutes Intravenous Every 8 hours 04/07/21 0952 04/08/21 0750   04/07/21 0700  vancomycin (VANCOCIN) IVPB 1000 mg/200 mL premix        1,000 mg 200 mL/hr over 60 Minutes Intravenous  Once 04/07/21 0649 04/07/21 0758           Objective: Vitals:   04/08/21 1616 04/08/21 2020 04/09/21 0324 04/09/21 0332  BP: 118/69 121/66 104/72   Pulse: (!) 101 (!) 106 (!) 108   Resp: 20 16 16    Temp: 99.4 F (37.4 C) 99 F (37.2 C)    TempSrc: Oral     SpO2: 100% 98% (!) 87% 97%  Height:        Intake/Output Summary (Last 24 hours) at 04/09/2021 0928 Last data filed at 04/08/2021 1600 Gross per 24 hour  Intake 614.16 ml  Output --  Net 614.16 ml   There were no vitals filed for this visit.  Examination:  General exam: frail, weak, alert and more interactive this am, not oriented to place or time, but  cooperative Respiratory system: diminished at basis, Respiratory effort normal. Cardiovascular system: S1 & S2 heard, RRR. [name] Gastrointestinal system: Abdomen is nondistended, soft , abdominal wound with dressing in place,  + bowel sounds  Central nervous system: more Alert and oriented to person only Extremities: no edema Skin: sacral ulcer, abdominal wound Psychiatry: calm and cooperative, no agitation.       Data Reviewed: I have personally reviewed following  labs and imaging studies  CBC: Recent Labs  Lab 04/05/2021 2216 04/08/21 0436 04/09/21 0828  WBC 16.0* 16.3* 16.8*  NEUTROABS 9.7*  --   --   HGB 12.6 9.5* 9.5*  HCT 41.3 30.1* 30.9*  MCV 95.6 94.4 94.8  PLT 328 244 623    Basic Metabolic Panel: Recent Labs  Lab 03/20/2021 2216 04/08/21 0436 04/09/21 0828  NA 146* 140 144  K 4.0 3.2* 3.7  CL 104 105 108  CO2 33* 29 31  GLUCOSE 106* 66* 111*  BUN 15 11 7*  CREATININE 0.73 0.48 0.49  CALCIUM 9.6 8.2* 8.5*  MG  --  1.6*  --     GFR: CrCl cannot be calculated (Unknown ideal weight.).  Liver Function Tests: Recent Labs  Lab 03/23/2021 2216 04/09/21 0828  AST 16 22  ALT 10 9  ALKPHOS 120 101  BILITOT 0.7 0.6  PROT 7.7 5.6*  ALBUMIN 2.5* 1.9*    CBG: Recent Labs  Lab 04/08/21 0908 04/08/21 1124  GLUCAP 51* 126*     Recent Results (from the past 240 hour(s))  Culture, blood (Routine x 2)     Status: None (Preliminary result)   Collection Time: 04/07/2021 10:16 PM   Specimen: BLOOD LEFT FOREARM  Result Value Ref Range Status   Specimen Description   Final    BLOOD LEFT FOREARM Performed at [facility] [address] [place], [place] [address]    Special Requests   Final    BOTTLES DRAWN AEROBIC AND ANAEROBIC Blood Culture results may not be optimal due to an excessive volume of blood received in culture bottles Performed at [facility] [address], [place], [place] [address]    Culture   Final     NO GROWTH 1 DAY Performed at [facility], [place] [address] [place], [place] [address]    Report Status PENDING  Incomplete  Resp Panel by RT-PCR (Flu A&B, Covid) Nasopharyngeal Swab     Status: None   Collection Time: 03/29/2021 10:41 PM   Specimen: Nasopharyngeal Swab; Nasopharyngeal(NP) swabs in vial transport medium  Result Value Ref Range Status   SARS Coronavirus 2 by RT PCR NEGATIVE NEGATIVE Final    Comment: (NOTE) SARS-CoV-2 target nucleic acids are NOT DETECTED.  The SARS-CoV-2 RNA is generally detectable in upper respiratory specimens during the acute phase of infection. The lowest concentration of SARS-CoV-2 viral copies this assay can detect is 138 copies/mL. A negative result does not preclude SARS-Cov-2 infection and should not be used as the sole basis for treatment or other patient management decisions. A negative result may occur with  improper specimen collection/handling, submission of specimen other than nasopharyngeal swab, presence of viral mutation(s) within the areas targeted by this assay, and inadequate number of viral copies(<138 copies/mL). A negative result must be combined with clinical observations, patient history, and epidemiological information. The expected result is Negative.  Fact Sheet for Patients:  EntrepreneurPulse.com.au  Fact Sheet for Healthcare Providers:  IncredibleEmployment.be  This test is no t yet approved or cleared by the [place] FDA and  has been authorized for detection and/or diagnosis of SARS-CoV-2 by FDA under an Emergency Use Authorization (EUA). This EUA will remain  in effect (meaning this test can be used) for the duration of the COVID-19 declaration under Section 564(b)(1) of the Act, 21 U.S.C.section 360bbb-3(b)(1), unless the authorization is terminated  or revoked sooner.       Influenza A by PCR  NEGATIVE NEGATIVE Final   Influenza B by PCR NEGATIVE NEGATIVE Final     Comment: (NOTE) The Xpert Xpress SARS-CoV-2/FLU/RSV plus assay is intended as an aid in the diagnosis of influenza from Nasopharyngeal swab specimens and should not be used as a sole basis for treatment. Nasal washings and aspirates are unacceptable for Xpert Xpress SARS-CoV-2/FLU/RSV testing.  Fact Sheet for Patients: EntrepreneurPulse.com.au  Fact Sheet for Healthcare Providers: IncredibleEmployment.be  This test is not yet approved or cleared by the [place] FDA and has been authorized for detection and/or diagnosis of SARS-CoV-2 by FDA under an Emergency Use Authorization (EUA). This EUA will remain in effect (meaning this test can be used) for the duration of the COVID-19 declaration under Section 564(b)(1) of the Act, 21 U.S.C. section 360bbb-3(b)(1), unless the authorization is terminated or revoked.  Performed at [facility], [place] [address], [place], [place] [address]   Aerobic/Anaerobic Culture w Gram Stain (surgical/deep wound)     Status: None (Preliminary result)   Collection Time: 03/17/2021 11:23 PM   Specimen: Abdomen; Wound  Result Value Ref Range Status   Specimen Description   Final    ABDOMEN Performed at [facility] [address], [place], [place] [address]    Special Requests   Final    NONE Performed at [facility], [place] [address] [place], [place] [address]    Gram Stain   Final    FEW SQUAMOUS EPITHELIAL CELLS PRESENT FEW WBC SEEN MODERATE GRAM POSITIVE COCCI Performed at [facility], [place] [address], [place], [place] [address]    Culture MODERATE STAPHYLOCOCCUS AUREUS  Final   Report Status PENDING  Incomplete  Aerobic Culture w Gram Stain (superficial specimen)     Status: None (Preliminary result)   Collection Time: 03/23/2021 11:23 PM   Specimen: Abdomen; Wound  Result Value Ref Range Status   Specimen Description   Final     ABDOMEN Performed at [facility] [address] [place], [place] [address]    Special Requests   Final    NONE Performed at [facility], [place] [address] [place], [place] [address]    Gram Stain   Final    NO SQUAMOUS EPITHELIAL CELLS SEEN FEW WBC SEEN NO ORGANISMS SEEN    Culture   Final    FEW STAPHYLOCOCCUS AUREUS FEW ENTEROCOCCUS FAECALIS SUSCEPTIBILITIES TO FOLLOW Performed at [facility], [place] [address] [place], [place] [address]    Report Status PENDING  Incomplete         Radiology Studies: No results found.      Scheduled Meds:  atorvastatin  10 mg Oral Daily   docusate sodium  100 mg Oral Daily   enoxaparin (LOVENOX) injection  30 mg Subcutaneous Q24H   famotidine  20 mg Oral Daily   glucagon (human recombinant)  1 mg Intramuscular STAT   lipase/protease/amylase  36,000 Units Oral TID AC   midodrine  5 mg Oral TID WC   mirtazapine  15 mg Oral QHS   nystatin   Topical TID   potassium chloride  20 mEq Oral Once   silver sulfADIAZINE   Topical Daily   Continuous Infusions:  dextrose 5% lactated ringers with KCl 20 mEq/L 75 mL/hr at 04/09/21 0318     LOS: 2 days   Time spent: 82mins, case discussed with palliative care Dr Rowe Pavy in person Greater than 50% of this time was spent in counseling,  explanation of diagnosis, planning of further management, and coordination of care.   Voice Recognition Viviann Spare dictation system was used to create this note, attempts have been made to correct errors. Please contact the author with questions and/or clarifications.   Florencia Reasons, MD PhD FACP [facility]  Available via Epic secure chat 7am-7pm for nonurgent issues Please page for urgent issues To page the attending provider between 7A-7P or the covering provider during after hours 7P-7A, please log into the web site [email or web address] and access using universal [place] password for that web site. If you do not  have the password, please call the hospital operator.    04/09/2021, 9:28 AM

## 2021-04-09 NOTE — Care Management Important Message (Signed)
Medicare IM printed for Social Work at [facility] to give to the patient 

## 2021-04-09 NOTE — Progress Notes (Addendum)
    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        Armandina Gemma, MD       [facility]       Office: [phone]   Chief Complaint: Leakage at gastrotomy site  Subjective: Patient in bed, non-sensical speech, comfortable.  Objective: Vital signs in last 24 hours: Temp:  [98.3 F (36.8 C)-99.4 F (37.4 C)] 99 F (37.2 C) (09/29 2020) Pulse Rate:  [101-111] 108 (09/30 0324) Resp:  [16-20] 16 (09/30 0324) BP: (100-121)/(49-72) 104/72 (09/30 0324) SpO2:  [87 %-100 %] 97 % (09/30 0332) Last BM Date: 04/08/21  Intake/Output from previous day: 09/29 0701 - 09/30 0700 In: 714.2 [P.O.:100; I.V.:113.3; IV Piggyback:500.9] Out: -  Intake/Output this shift: No intake/output data recorded.  Physical Exam: HEENT - sclerae clear, mucous membranes moist Abdomen - dressing dry and intact; petechial rash around dressing; no drainage   Lab Results:  Recent Labs    03/27/2021 2216 04/08/21 0436  WBC 16.0* 16.3*  HGB 12.6 9.5*  HCT 41.3 30.1*  PLT 328 244   BMET Recent Labs    03/28/2021 2216 04/08/21 0436  NA 146* 140  K 4.0 3.2*  CL 104 105  CO2 33* 29  GLUCOSE 106* 66*  BUN 15 11  CREATININE 0.73 0.48  CALCIUM 9.6 8.2*   PT/INR Recent Labs    03/28/2021 2216  LABPROT 14.7  INR 1.1   Comprehensive Metabolic Panel:    Component Value Date/Time   NA 140 04/08/2021 0436   NA 146 (H) 03/16/2021 2216   NA 138 04/29/2020 0948   NA 141 12/12/2018 1607   K 3.2 (L) 04/08/2021 0436   K 4.0 04/04/2021 2216   CL 105 04/08/2021 0436   CL 104 03/18/2021 2216   CO2 29 04/08/2021 0436   CO2 33 (H) 03/12/2021 2216   BUN 11 04/08/2021 0436   BUN 15 04/05/2021 2216   BUN 11 04/29/2020 0948   BUN 16 12/12/2018 1607   CREATININE 0.48 04/08/2021 0436    CREATININE 0.73 03/28/2021 2216   CREATININE 0.74 11/30/2015 1327   CREATININE 0.78 04/30/2013 1629   GLUCOSE 66 (L) 04/08/2021 0436   GLUCOSE 106 (H) 04/01/2021 2216   CALCIUM 8.2 (L) 04/08/2021 0436   CALCIUM 9.6 03/25/2021 2216   AST 16 03/29/2021 2216   AST 21 03/03/2021 0142   ALT 10 04/05/2021 2216   ALT 18 03/03/2021 0142   ALKPHOS 120 03/17/2021 2216   ALKPHOS 76 03/03/2021 0142   BILITOT 0.7 03/16/2021 2216   BILITOT 0.3 03/03/2021 0142   BILITOT 0.7 04/29/2020 0948   BILITOT 0.6 07/09/2019 1050   PROT 7.7 04/08/2021 2216   PROT 6.6 03/03/2021 0142   PROT 6.5 04/29/2020 0948   PROT 7.1 07/09/2019 1050   ALBUMIN 2.5 (L) 03/28/2021 2216   ALBUMIN 2.8 (L) 03/03/2021 0142   ALBUMIN 3.8 04/29/2020 0948   ALBUMIN 4.1 07/09/2019 1050    Studies/Results: No results found.    Armandina Gemma 04/09/2021   Patient ID: [name] [name], female   DOB: 10/24/1930, 87 y.o.   MRN: [id number]

## 2021-04-10 DIAGNOSIS — D72829 Elevated white blood cell count, unspecified: Secondary | ICD-10-CM | POA: Diagnosis not present

## 2021-04-10 DIAGNOSIS — M8668 Other chronic osteomyelitis, other site: Secondary | ICD-10-CM | POA: Diagnosis not present

## 2021-04-10 DIAGNOSIS — I5032 Chronic diastolic (congestive) heart failure: Secondary | ICD-10-CM | POA: Diagnosis not present

## 2021-04-10 DIAGNOSIS — G9341 Metabolic encephalopathy: Secondary | ICD-10-CM | POA: Diagnosis not present

## 2021-04-10 LAB — CBC WITH DIFFERENTIAL/PLATELET
Abs Immature Granulocytes: 0.17 10*3/uL — ABNORMAL HIGH (ref 0.00–0.07)
Basophils Absolute: 0.1 10*3/uL (ref 0.0–0.1)
Basophils Relative: 0 %
Eosinophils Absolute: 0.1 10*3/uL (ref 0.0–0.5)
Eosinophils Relative: 0 %
HCT: 31.4 % — ABNORMAL LOW (ref 36.0–46.0)
Hemoglobin: 10 g/dL — ABNORMAL LOW (ref 12.0–15.0)
Immature Granulocytes: 1 %
Lymphocytes Relative: 15 %
Lymphs Abs: 2.6 10*3/uL (ref 0.7–4.0)
MCH: 29.9 pg (ref 26.0–34.0)
MCHC: 31.8 g/dL (ref 30.0–36.0)
MCV: 94 fL (ref 80.0–100.0)
Monocytes Absolute: 1.6 10*3/uL — ABNORMAL HIGH (ref 0.1–1.0)
Monocytes Relative: 9 %
Neutro Abs: 13.6 10*3/uL — ABNORMAL HIGH (ref 1.7–7.7)
Neutrophils Relative %: 75 %
Platelets: 268 10*3/uL (ref 150–400)
RBC: 3.34 MIL/uL — ABNORMAL LOW (ref 3.87–5.11)
RDW: 14.4 % (ref 11.5–15.5)
WBC: 18.1 10*3/uL — ABNORMAL HIGH (ref 4.0–10.5)
nRBC: 0 % (ref 0.0–0.2)

## 2021-04-10 LAB — AEROBIC CULTURE W GRAM STAIN (SUPERFICIAL SPECIMEN): Gram Stain: NONE SEEN

## 2021-04-10 LAB — BASIC METABOLIC PANEL
Anion gap: 6 (ref 5–15)
BUN: 8 mg/dL (ref 8–23)
CO2: 31 mmol/L (ref 22–32)
Calcium: 8.5 mg/dL — ABNORMAL LOW (ref 8.9–10.3)
Chloride: 106 mmol/L (ref 98–111)
Creatinine, Ser: 0.46 mg/dL (ref 0.44–1.00)
GFR, Estimated: >60 mL/min (ref >60)
Glucose, Bld: 79 mg/dL (ref 70–99)
Potassium: 3.4 mmol/L — ABNORMAL LOW (ref 3.5–5.1)
Sodium: 143 mmol/L (ref 135–145)

## 2021-04-10 LAB — MAGNESIUM: Magnesium: 1.6 mg/dL — ABNORMAL LOW (ref 1.7–2.4)

## 2021-04-10 MED ORDER — POTASSIUM CHLORIDE 20 MEQ PO PACK: 40.0000 meq | PACK | Freq: Once | ORAL | Status: DC

## 2021-04-10 MED ORDER — POTASSIUM CHLORIDE CRYS ER 20 MEQ PO TBCR: 20.0000 meq | EXTENDED_RELEASE_TABLET | Freq: Two times a day (BID) | ORAL | Status: DC

## 2021-04-10 MED ORDER — MAGNESIUM SULFATE 2 GM/50ML IV SOLN
2.0000 g | Freq: Once | INTRAVENOUS | Status: AC
  Administered 2021-04-10: 2 g via INTRAVENOUS
  Filled 2021-04-10: qty 50

## 2021-04-10 MED ORDER — METRONIDAZOLE 0.75 % EX GEL
Freq: Two times a day (BID) | CUTANEOUS | Status: DC
  Administered 2021-04-16: 1 via TOPICAL
  Filled 2021-04-10 (×2): qty 45

## 2021-04-10 MED ORDER — SENNOSIDES-DOCUSATE SODIUM 8.6-50 MG PO TABS
1.0000 | ORAL_TABLET | Freq: Two times a day (BID) | ORAL | Status: DC
  Administered 2021-04-10 – 2021-04-16 (×3): 1 via ORAL
  Filled 2021-04-10 (×8): qty 1

## 2021-04-10 MED ORDER — DEXTROSE IN LACTATED RINGERS 5 % IV SOLN: INTRAVENOUS | Status: DC

## 2021-04-10 MED ORDER — POTASSIUM CHLORIDE CRYS ER 20 MEQ PO TBCR
20.0000 meq | EXTENDED_RELEASE_TABLET | Freq: Two times a day (BID) | ORAL | Status: DC
  Administered 2021-04-10 – 2021-04-11 (×2): 20 meq via ORAL
  Filled 2021-04-10 (×2): qty 1

## 2021-04-10 NOTE — Progress Notes (Signed)
PROGRESS NOTE    [name] KLAMMER  [name]:[id number] DOB: 04/12/1931 DOA: 04/09/2021 PCP: Hoyt Koch, MD    Chief Complaint  Patient presents with   Altered Mental Status    Brief Narrative:  [name] [name] is a 87 y.o. female with medical history significant for CHF, chronic nonhealing abdominal wound, hx of DVT/PE, CAD, RA sent from SNF due to  altered mental status, per family she was interactive a week ago, but has progressive lethargy for the past week, per family patient had temperature of 98.9  and increased heart rate at snf  Subjective:  She is frail but alert and interactive, carrying conversations with family at bedside  Assessment & Plan:   Principal Problem:   AMS (altered mental status) Active Problems:   Chronic diastolic CHF (congestive heart failure) (HCC)   Chronic osteomyelitis of sacrum (HCC)   Fecal impaction ([place])   Leukocytosis   Chronic wound infection of abdomen   Chronic dermatitis   Nonhealing surgical wound, initial encounter   Palliative care by specialist   Goals of care, counseling/discussion   General weakness  Acute metabolic encephalopathy From infection?  Dehydration?  Oversedation from pain medication (per family report patient received pain medication at nursing home) She was started on broad spectrum abx on admission due to concerns for possible chronic sacral osteomyelitis seen on cCT scan  Wound swab on admission + staph aureus, but wound does not appear infected  Blood culture no growth ID consulted who recommended stop all abx as risk overweight benefit, please see ID Dr Hart [place] note from 9/29 Mental status has improved, family reports her mental status is closer to baseline   Sacral decubitus ulcer  Appear has this  for several months, see pic below Topical metronidazole Wound care consult  Hypomagnesemia/hypokalemia Replaced, improved  Hypoglycemia Likely due to poor oral intake, started on D5 LR  Nonhealing abdominal  wound/poor nutrition status General surgery consulted who recommended wound care, and replace peg tube by IR Patient and family has long discussion on 10/1, they decided to forgo peg tube placement  Interstitial pulmonary fibrosis/chronic hypoxic respiratory failure on home O2 2 L/immunosuppressed status, appears on chronic prednisone, and CellCept  FTT, poor prognosis, palliative care consulted , family initially decided to proceed with replace peg tube then snf placement with outpatient palliative care, on 10/1 Family have discussed with ms Leisure again when she is in her sound mind that she cleared does not want to place the g tube back in, she does not want to be resuscitated, Patient and family plan to [facility], two family members ( both are HPOA) were present with discussion .  I have notified palliative care and IR regarding patient does not wish to proceed to peg placement, peg placement order cancelled transitional care order placed as well  Nutritional Assessment:  The patient's BMI is: Body mass index is 17.16 kg/m.[name]  Seen by dietician.  I agree with the assessment and plan as outlined below:  Nutrition Status:        .       Unresulted Labs (From admission, onward)     Start     Ordered   04/12/21 0500  CBC  Every Monday (0500),   R       Question:  Specimen collection method  Answer:  Lab=Lab collect   04/09/21 2223   04/12/21 8119  Basic metabolic panel  Every Monday (0500),   R  Question:  Specimen collection method  Answer:  Lab=Lab collect   04/09/21 2224              DVT prophylaxis: Place and maintain sequential compression device Start: 04/09/21 2223 enoxaparin (LOVENOX) injection 30 mg Start: 04/07/21 1000   Code Status:full Family Communication: HPOA nephew [name] /Gwen at bedside   Disposition:   Status is: Inpatient   Dispo: The patient is from: SNF              Anticipated d/c is to:  SNF ( [facility])              Anticipated d/c date is: SNF with hospice                Consultants:  General surgery ID Palliative care Wound care IR  Procedures:  none  Antimicrobials:   Anti-infectives (From admission, onward)    Start     Dose/Rate Route Frequency Ordered Stop   04/08/21 1700  meropenem (MERREM) 1 g in sodium chloride 0.9 % 100 mL IVPB  Status:  Discontinued        1 g 200 mL/hr over 30 Minutes Intravenous Every 12 hours 04/08/21 0750 04/08/21 1125   04/08/21 1200  vancomycin (VANCOREADY) IVPB 500 mg/100 mL  Status:  Discontinued        500 mg 100 mL/hr over 60 Minutes Intravenous Every 24 hours 04/07/21 0717 04/08/21 1125   04/07/21 1200  meropenem (MERREM) 1 g in sodium chloride 0.9 % 100 mL IVPB  Status:  Discontinued        1 g 200 mL/hr over 30 Minutes Intravenous Every 8 hours 04/07/21 0952 04/08/21 0750   04/07/21 0700  vancomycin (VANCOCIN) IVPB 1000 mg/200 mL premix        1,000 mg 200 mL/hr over 60 Minutes Intravenous  Once 04/07/21 0649 04/07/21 0758           Objective: Vitals:   04/09/21 1501 04/09/21 2040 04/10/21 0440 04/10/21 1524  BP: (!) 102/56 106/68 107/61 128/77  Pulse: 93 (!) 107 (!) 105   Resp: 15 16 20 20   Temp: 98.2 F (36.8 C) 99.7 F (37.6 C) 98.1 F (36.7 C) 98.7 F (37.1 C)  TempSrc: Oral Oral Oral Oral  SpO2: 100% 90% 93% 96%  Height:        Intake/Output Summary (Last 24 hours) at 04/10/2021 2007 Last data filed at 04/10/2021 7026 Gross per 24 hour  Intake 60 ml  Output --  Net 60 ml   There were no vitals filed for this visit.  Examination:  General exam: frail, weak, alert and more interactive this am, not oriented to place or time, but cooperative Respiratory system: diminished at basis, Respiratory effort normal. Cardiovascular system: S1 & S2 heard, RRR. [name] Gastrointestinal system: Abdomen is nondistended, soft , abdominal wound with dressing in place,  + bowel sounds   Central nervous system: more Alert and oriented to person only Extremities: no edema Skin: sacral wound, abdominal wound Psychiatry: calm and cooperative, no agitation.        Data Reviewed: I have personally reviewed following labs and imaging studies  CBC: Recent Labs  Lab 03/30/2021 2216 04/08/21 0436 04/09/21 0828 04/10/21 0553  WBC 16.0* 16.3* 16.8* 18.1*  NEUTROABS 9.7*  --   --  13.6*  HGB 12.6 9.5* 9.5* 10.0*  HCT 41.3 30.1* 30.9* 31.4*  MCV 95.6 94.4 94.8 94.0  PLT 328 244 239 268  Basic Metabolic Panel: Recent Labs  Lab 03/25/2021 2216 04/08/21 0436 04/09/21 0828 04/10/21 0553  NA 146* 140 144 143  K 4.0 3.2* 3.7 3.4*  CL 104 105 108 106  CO2 33* 29 31 31   GLUCOSE 106* 66* 111* 79  BUN 15 11 7* 8  CREATININE 0.73 0.48 0.49 0.46  CALCIUM 9.6 8.2* 8.5* 8.5*  MG  --  1.6* 1.8 1.6*    GFR: CrCl cannot be calculated (Unknown ideal weight.).  Liver Function Tests: Recent Labs  Lab 03/11/2021 2216 04/09/21 0828  AST 16 22  ALT 10 9  ALKPHOS 120 101  BILITOT 0.7 0.6  PROT 7.7 5.6*  ALBUMIN 2.5* 1.9*    CBG: Recent Labs  Lab 04/08/21 0908 04/08/21 1124  GLUCAP 51* 126*     Recent Results (from the past 240 hour(s))  Culture, blood (Routine x 2)     Status: None (Preliminary result)   Collection Time: 03/29/2021 10:16 PM   Specimen: BLOOD LEFT FOREARM  Result Value Ref Range Status   Specimen Description   Final    BLOOD LEFT FOREARM Performed at [facility] [address], [place], [place] [address]    Special Requests   Final    BOTTLES DRAWN AEROBIC AND ANAEROBIC Blood Culture results may not be optimal due to an excessive volume of blood received in culture bottles Performed at [facility] [address], [place], [place] [address]    Culture   Final    NO GROWTH 3 DAYS Performed at [facility], [place] [address] [place], [place] [address]    Report Status PENDING  Incomplete  Resp  Panel by RT-PCR (Flu A&B, Covid) Nasopharyngeal Swab     Status: None   Collection Time: 03/31/2021 10:41 PM   Specimen: Nasopharyngeal Swab; Nasopharyngeal(NP) swabs in vial transport medium  Result Value Ref Range Status   SARS Coronavirus 2 by RT PCR NEGATIVE NEGATIVE Final    Comment: (NOTE) SARS-CoV-2 target nucleic acids are NOT DETECTED.  The SARS-CoV-2 RNA is generally detectable in upper respiratory specimens during the acute phase of infection. The lowest concentration of SARS-CoV-2 viral copies this assay can detect is 138 copies/mL. A negative result does not preclude SARS-Cov-2 infection and should not be used as the sole basis for treatment or other patient management decisions. A negative result may occur with  improper specimen collection/handling, submission of specimen other than nasopharyngeal swab, presence of viral mutation(s) within the areas targeted by this assay, and inadequate number of viral copies(<138 copies/mL). A negative result must be combined with clinical observations, patient history, and epidemiological information. The expected result is Negative.  Fact Sheet for Patients:  EntrepreneurPulse.com.au  Fact Sheet for Healthcare Providers:  IncredibleEmployment.be  This test is no t yet approved or cleared by the [place] FDA and  has been authorized for detection and/or diagnosis of SARS-CoV-2 by FDA under an Emergency Use Authorization (EUA). This EUA will remain  in effect (meaning this test can be used) for the duration of the COVID-19 declaration under Section 564(b)(1) of the Act, 21 U.S.C.section 360bbb-3(b)(1), unless the authorization is terminated  or revoked sooner.       Influenza A by PCR NEGATIVE NEGATIVE Final   Influenza B by PCR NEGATIVE NEGATIVE Final    Comment: (NOTE) The Xpert Xpress SARS-CoV-2/FLU/RSV plus assay is intended as an aid in the diagnosis of influenza from Nasopharyngeal  swab specimens and should not be used as  a sole basis for treatment. Nasal washings and aspirates are unacceptable for Xpert Xpress SARS-CoV-2/FLU/RSV testing.  Fact Sheet for Patients: EntrepreneurPulse.com.au  Fact Sheet for Healthcare Providers: IncredibleEmployment.be  This test is not yet approved or cleared by the [place] FDA and has been authorized for detection and/or diagnosis of SARS-CoV-2 by FDA under an Emergency Use Authorization (EUA). This EUA will remain in effect (meaning this test can be used) for the duration of the COVID-19 declaration under Section 564(b)(1) of the Act, 21 U.S.C. section 360bbb-3(b)(1), unless the authorization is terminated or revoked.  Performed at [facility], [place] [address] [place], [place] [address]   Aerobic/Anaerobic Culture w Gram Stain (surgical/deep wound)     Status: None (Preliminary result)   Collection Time: 03/17/2021 11:23 PM   Specimen: Abdomen; Wound  Result Value Ref Range Status   Specimen Description   Final    ABDOMEN Performed at [facility] [address] [place], [place] [address]    Special Requests   Final    NONE Performed at [facility], [place] [address], [place], [place] [address]    Gram Stain   Final    FEW SQUAMOUS EPITHELIAL CELLS PRESENT FEW WBC SEEN MODERATE GRAM POSITIVE COCCI Performed at [facility], [place] [address], [place], [place] [address]    Culture   Final    MODERATE METHICILLIN RESISTANT STAPHYLOCOCCUS AUREUS FEW CANDIDA ALBICANS NO ANAEROBES ISOLATED; CULTURE IN PROGRESS FOR 5 DAYS    Report Status PENDING  Incomplete   Organism ID, Bacteria METHICILLIN RESISTANT STAPHYLOCOCCUS AUREUS  Final      Susceptibility   Methicillin resistant staphylococcus aureus - MIC*    CIPROFLOXACIN >=8 RESISTANT Resistant     ERYTHROMYCIN >=8 RESISTANT Resistant     GENTAMICIN <=0.5 SENSITIVE  Sensitive     OXACILLIN >=4 RESISTANT Resistant     TETRACYCLINE <=1 SENSITIVE Sensitive     VANCOMYCIN <=0.5 SENSITIVE Sensitive     TRIMETH/SULFA <=10 SENSITIVE Sensitive     CLINDAMYCIN <=0.25 SENSITIVE Sensitive     RIFAMPIN <=0.5 SENSITIVE Sensitive     Inducible Clindamycin NEGATIVE Sensitive     * MODERATE METHICILLIN RESISTANT STAPHYLOCOCCUS AUREUS  Aerobic Culture w Gram Stain (superficial specimen)     Status: None   Collection Time: 03/21/2021 11:23 PM   Specimen: Abdomen; Wound  Result Value Ref Range Status   Specimen Description   Final    ABDOMEN Performed at [facility] [address] [place], [place] [address]    Special Requests   Final    NONE Performed at [facility], [place] [address], [place], [place] [address]    Gram Stain   Final    NO SQUAMOUS EPITHELIAL CELLS SEEN FEW WBC SEEN NO ORGANISMS SEEN Performed at [facility], [place] [address] [place], [place] [address]    Culture   Final    FEW METHICILLIN RESISTANT STAPHYLOCOCCUS AUREUS FEW ENTEROCOCCUS FAECALIS    Report Status 04/10/2021 FINAL  Final   Organism ID, Bacteria METHICILLIN RESISTANT STAPHYLOCOCCUS AUREUS  Final   Organism ID, Bacteria ENTEROCOCCUS FAECALIS  Final      Susceptibility   Enterococcus faecalis - MIC*    AMPICILLIN <=2 SENSITIVE Sensitive     VANCOMYCIN 1 SENSITIVE Sensitive     GENTAMICIN SYNERGY SENSITIVE Sensitive     * FEW ENTEROCOCCUS FAECALIS   Methicillin resistant staphylococcus aureus - MIC*    CIPROFLOXACIN >=8  RESISTANT Resistant     ERYTHROMYCIN >=8 RESISTANT Resistant     GENTAMICIN <=0.5 SENSITIVE Sensitive     OXACILLIN >=4 RESISTANT Resistant     TETRACYCLINE <=1 SENSITIVE Sensitive     VANCOMYCIN <=0.5 SENSITIVE Sensitive     TRIMETH/SULFA <=10 SENSITIVE Sensitive     CLINDAMYCIN <=0.25 SENSITIVE Sensitive     RIFAMPIN <=0.5 SENSITIVE Sensitive     Inducible Clindamycin NEGATIVE Sensitive     * FEW  METHICILLIN RESISTANT STAPHYLOCOCCUS AUREUS         Radiology Studies: No results found.      Scheduled Meds:  atorvastatin  10 mg Oral Daily   enoxaparin (LOVENOX) injection  30 mg Subcutaneous Q24H   famotidine  20 mg Oral Daily   lipase/protease/amylase  36,000 Units Oral TID AC   metroNIDAZOLE   Topical BID   midodrine  5 mg Oral TID WC   mirtazapine  15 mg Oral QHS   nystatin   Topical TID   polyethylene glycol  17 g Oral Daily   potassium chloride  20 mEq Oral Once   potassium chloride  20 mEq Oral BID   senna-docusate  1 tablet Oral BID   silver sulfADIAZINE   Topical Daily   Continuous Infusions:  dextrose 5% lactated ringers        LOS: 3 days   Time spent: 36mins, case discussed with palliative care Dr Rowe Pavy in person Greater than 50% of this time was spent in counseling, explanation of diagnosis, planning of further management, and coordination of care.   Voice Recognition Viviann Spare dictation system was used to create this note, attempts have been made to correct errors. Please contact the author with questions and/or clarifications.   Florencia Reasons, MD PhD FACP [facility]  Available via Epic secure chat 7am-7pm for nonurgent issues Please page for urgent issues To page the attending provider between 7A-7P or the covering provider during after hours 7P-7A, please log into the web site [email or web address] and access using universal [place] password for that web site. If you do not have the password, please call the hospital operator.    04/10/2021, 8:07 PM

## 2021-04-10 NOTE — Progress Notes (Addendum)
IR was requested for image guided G tube placement.  The G tube was originally placed in Feb 2022 by surgery, replaced by IR couple times (most recent replacement on 01/15/21 22 fr balloon retention G tube,) and it appears that the tube was ultimately removed by surgery per patient request around 03/04/21 per chart review.   Patient now suffers from abdominal wall excoriation due to leakage from old G tube insertion site, request was made to place a G tube to prevent further leakage and for nutrition support.    Case was reviewed by Dr. Denna Haggard, will attempt to recanalize the old track under fluoro early next week.  Anatomy amendable for new G tube placement as well; however, a new G tube placement can only be done after patient recovers from the abdominal wall excoriation.   Will attempt to discuss the procedure with patient and her family members over weekend.   Please call IR for questions and concerns.   Armando Gang Nettie Wyffels PA-C 04/10/2021 8:37 AM

## 2021-04-10 NOTE — Progress Notes (Signed)
PMT no charge note.   Discussed with [facility] MD Dr. Erlinda Hong regards to her conversations with the patient and her family earlier today.  Family has elected for not proceeding with feeding tube placement.  They have elected for proceeding with having [facility].  They are acting in the patient's best interests as the patient has previously stated that she did not want to have feeding tube anymore.  This topic of feeding tube has become very distressing for the patient and she physically and emotionally withdraws from clinicians whenever this conversation is initiated. Discussed with Dr. Erlinda Hong about patient's osteomyelitis of sacrum and strategies to address chronic infection and odor.  From a palliative standpoint, recommend topical metronidazole gel twice daily as it can act as an anti-infective as well as anti-inflammatory agent. No further recommendations from palliative team.  Agree with addition of hospice services going forward. No charge. Loistine Chance, MD [place] palliative.

## 2021-04-10 DEATH — deceased

## 2021-04-11 DIAGNOSIS — D72829 Elevated white blood cell count, unspecified: Secondary | ICD-10-CM | POA: Diagnosis not present

## 2021-04-11 DIAGNOSIS — M8668 Other chronic osteomyelitis, other site: Secondary | ICD-10-CM | POA: Diagnosis not present

## 2021-04-11 DIAGNOSIS — G9341 Metabolic encephalopathy: Secondary | ICD-10-CM | POA: Diagnosis not present

## 2021-04-11 DIAGNOSIS — I5032 Chronic diastolic (congestive) heart failure: Secondary | ICD-10-CM | POA: Diagnosis not present

## 2021-04-11 MED ORDER — HYDROMORPHONE HCL 1 MG/ML IJ SOLN
0.5000 mg | INTRAMUSCULAR | Status: DC | PRN
  Administered 2021-04-14 – 2021-04-17 (×11): 0.5 mg via INTRAVENOUS
  Filled 2021-04-11 (×11): qty 0.5

## 2021-04-11 MED ORDER — ZINC OXIDE 40 % EX OINT
TOPICAL_OINTMENT | Freq: Four times a day (QID) | CUTANEOUS | Status: DC | PRN
  Filled 2021-04-11: qty 57

## 2021-04-11 MED ORDER — SODIUM CHLORIDE 0.9 % IV BOLUS
1000.0000 mL | Freq: Once | INTRAVENOUS | Status: AC
  Administered 2021-04-11: 1000 mL via INTRAVENOUS

## 2021-04-11 MED ORDER — DEXTROSE IN LACTATED RINGERS 5 % IV SOLN: INTRAVENOUS | Status: AC

## 2021-04-11 NOTE — Progress Notes (Signed)
   04/11/21 1312  Assess: MEWS Score  Temp 98.9 F (37.2 C)  BP 121/73  Pulse Rate (!) 117  Resp 20  Level of Consciousness Alert  SpO2 96 %  O2 Device Nasal Cannula  Assess: MEWS Score  MEWS Temp 0  MEWS Systolic 0  MEWS Pulse 2  MEWS RR 0  MEWS LOC 0  MEWS Score 2  MEWS Score Color Yellow  Assess: if the MEWS score is Yellow or Red  Were vital signs taken at a resting state? Yes  Focused Assessment No change from prior assessment  Does the patient meet 2 or more of the SIRS criteria? Yes  Does the patient have a confirmed or suspected source of infection? Yes  Provider and Rapid Response Notified? No  MEWS guidelines implemented *See Row Information* Yes  Treat  MEWS Interventions Escalated (See documentation below);Administered prn meds/treatments  Pain Scale PAINAD  Breathing 0  Negative Vocalization 1  Facial Expression 1  Body Language 0  Consolability 0  PAINAD Score 2  Complains of Diarrhea  Diarrhea relieved by Nothing  Take Vital Signs  Increase Vital Sign Frequency  Yellow: Q 2hr X 2 then Q 4hr X 2, if remains yellow, continue Q 4hrs  Escalate  MEWS: Escalate Yellow: discuss with charge nurse/RN and consider discussing with provider and RRT  Notify: Charge Nurse/RN  Name of Charge Nurse/RN Notified Abigail Butts, RN  Date Charge Nurse/RN Notified 04/11/21  Time Charge Nurse/RN Notified 1353  Notify: Provider  Provider Name/Title Xu  Date Provider Notified 04/11/21  Time Provider Notified 1355  Notification Type Page  Notification Reason Change in status  Assess: SIRS CRITERIA  SIRS Temperature  0  SIRS Pulse 1  SIRS Respirations  0  SIRS WBC 0  SIRS Score Sum  1   Patient's MEWS score is yellow. Charge nurse and MD were notified. Frequency of vital signs increased per protocol and PRN tylenol was given for pain.

## 2021-04-11 NOTE — Progress Notes (Signed)
Family arrived and spoke with Erlinda Hong, MD regarding comfort care.

## 2021-04-11 NOTE — Progress Notes (Signed)
PROGRESS NOTE    [name] [name]  SWN:[id number] DOB: 27-May-1931 DOA: 04/05/2021 PCP: Hoyt Koch, MD    Chief Complaint  Patient presents with   Altered Mental Status    Brief Narrative:  [name] [name] is a 87 y.o. female with medical history significant for CHF, chronic nonhealing abdominal wound, hx of DVT/PE, CAD, RA sent from SNF due to  altered mental status, per family she was interactive a week ago, but has progressive lethargy for the past week, per family patient had temperature of 98.9  and increased heart rate at snf  Subjective:  She  appears has got worse, she is very lethargic, she did not eat much today, blood pressure fluctuating   Assessment & Plan:   Principal Problem:   AMS (altered mental status) Active Problems:   Chronic diastolic CHF (congestive heart failure) (HCC)   Chronic osteomyelitis of sacrum (HCC)   Fecal impaction (HCC)   Leukocytosis   Chronic wound infection of abdomen   Chronic dermatitis   Nonhealing surgical wound, initial encounter   Palliative care by specialist   Goals of care, counseling/discussion   General weakness  Acute metabolic encephalopathy -She was found lethargic with reported fever at snf -Encephalopathy From infection?  Dehydration?  Oversedation from pain medication (per family report patient received pain medication at nursing home) -She was started on broad spectrum abx on admission due to concerns for possible "chronic sacral osteomyelitis" seen on CT scan  -abdominal Wound swab on admission + staph aureus, but wound does not appear infected  -Blood culture no growth -ID consulted who recommended stop all abx as risk overweight benefit, please see ID Dr Hart [place] note from 9/29 -Mental status has improved initially , was able to participate goals of care discussion with family on 10/1, however, start to become lethargic and less responsive on 10/2, I have discussed with family and decided to start full comfort  care  Nonhealing abdominal wound/poor nutrition status General surgery consulted who recommended wound care and replace peg tube by IR Patient and family had long discussion with patient on 10/1, they decided to forgo peg tube placement  Sacral decubitus ulcer  Appear has this  for several months, see pic below Topical metronidazole Wound care consulted  Hypomagnesemia/hypokalemia Replaced, improved  Hypoglycemia Likely due to poor oral intake, started on D5 LR   Interstitial pulmonary fibrosis/chronic hypoxic respiratory failure on home O2 2 L/immunosuppressed status, appears on chronic prednisone, and CellCept  FTT, poor prognosis, palliative care consulted , family initially decided to proceed with replace peg tube then snf placement with outpatient palliative care.  on 10/1 Family have discussed with [name] [name] again when she is in her sound mind that she cleared does not want to place the g tube back in, she does not want to be resuscitated, Patient and family plan to pursue sn[facility] with hosp[facility] care, two family members ( both are HPOA) were present with discussion .  I have notified palliative care and IR regarding patient does not wish to proceed to peg placement, peg placement order cancelled transitional care order placed as well  On 10/2, patient appears to become worse, very lethargic, not eating Discussed with HPOA , planned to start full comfort care, POA desires to continue hydration for now palliative care Dr Rowe Pavy agreed full comfort care, palliative care will evaluate patent tomorrow   Body mass index is 17.16 kg/m.[name]     DVT prophylaxis: Place and maintain sequential  compression device Start: 04/09/21 2223   Code Status:full Family Communication: HPOA nephew [name] /Gwen over the phone  Disposition:   Status is: Inpatient   Dispo: The patient is from: SNF              Anticipated d/c is to: SNF ( [facility])               Anticipated d/c date is: SNF with hospice vs hospice facility vs in hospital death                 Consultants:  General surgery ID Palliative care Wound care IR  Procedures:  none  Antimicrobials:   Anti-infectives (From admission, onward)    Start     Dose/Rate Route Frequency Ordered Stop   04/08/21 1700  meropenem (MERREM) 1 g in sodium chloride 0.9 % 100 mL IVPB  Status:  Discontinued        1 g 200 mL/hr over 30 Minutes Intravenous Every 12 hours 04/08/21 0750 04/08/21 1125   04/08/21 1200  vancomycin (VANCOREADY) IVPB 500 mg/100 mL  Status:  Discontinued        500 mg 100 mL/hr over 60 Minutes Intravenous Every 24 hours 04/07/21 0717 04/08/21 1125   04/07/21 1200  meropenem (MERREM) 1 g in sodium chloride 0.9 % 100 mL IVPB  Status:  Discontinued        1 g 200 mL/hr over 30 Minutes Intravenous Every 8 hours 04/07/21 0952 04/08/21 0750   04/07/21 0700  vancomycin (VANCOCIN) IVPB 1000 mg/200 mL premix        1,000 mg 200 mL/hr over 60 Minutes Intravenous  Once 04/07/21 0649 04/07/21 0758           Objective: Vitals:   04/11/21 1555 04/11/21 1557 04/11/21 1632 04/11/21 1738  BP: (!) 92/52 (!) 87/53 (!) 90/48 120/66  Pulse: 99 98  (!) 108  Resp: 20   20  Temp: 99.2 F (37.3 C)   99.2 F (37.3 C)  TempSrc: Oral     SpO2: (!) 88% (!) 87%  98%  Height:        Intake/Output Summary (Last 24 hours) at 04/11/2021 2025 Last data filed at 04/11/2021 1800 Gross per 24 hour  Intake 1030.15 ml  Output --  Net 1030.15 ml   There were no vitals filed for this visit.  Examination:  General exam: frail, weak, lethargic  Respiratory system: diminished at basis, Respiratory effort normal. Cardiovascular system: S1 & S2 heard, RRR. [name] Gastrointestinal system: Abdomen is nondistended, soft , abdominal wound with dressing in place,  + bowel sounds  Central nervous system: lethargic Extremities: no edema Skin: sacral wound, abdominal wound Psychiatry:  lethargic           Data Reviewed: I have personally reviewed following labs and imaging studies  CBC: Recent Labs  Lab 03/28/2021 2216 04/08/21 0436 04/09/21 0828 04/10/21 0553  WBC 16.0* 16.3* 16.8* 18.1*  NEUTROABS 9.7*  --   --  13.6*  HGB 12.6 9.5* 9.5* 10.0*  HCT 41.3 30.1* 30.9* 31.4*  MCV 95.6 94.4 94.8 94.0  PLT 328 244 239 433    Basic Metabolic Panel: Recent Labs  Lab 03/27/2021 2216 04/08/21 0436 04/09/21 0828 04/10/21 0553  NA 146* 140 144 143  K 4.0 3.2* 3.7 3.4*  CL 104 105 108 106  CO2 33* 29 31 31   GLUCOSE 106* 66* 111* 79  BUN 15 11 7* 8  CREATININE  0.73 0.48 0.49 0.46  CALCIUM 9.6 8.2* 8.5* 8.5*  MG  --  1.6* 1.8 1.6*    GFR: CrCl cannot be calculated (Unknown ideal weight.).  Liver Function Tests: Recent Labs  Lab 03/14/2021 2216 04/09/21 0828  AST 16 22  ALT 10 9  ALKPHOS 120 101  BILITOT 0.7 0.6  PROT 7.7 5.6*  ALBUMIN 2.5* 1.9*    CBG: Recent Labs  Lab 04/08/21 0908 04/08/21 1124  GLUCAP 51* 126*     Recent Results (from the past 240 hour(s))  Culture, blood (Routine x 2)     Status: None (Preliminary result)   Collection Time: 03/29/2021 10:16 PM   Specimen: BLOOD LEFT FOREARM  Result Value Ref Range Status   Specimen Description   Final    BLOOD LEFT FOREARM Performed at [facility] [address], [place], [place] [address]    Special Requests   Final    BOTTLES DRAWN AEROBIC AND ANAEROBIC Blood Culture results may not be optimal due to an excessive volume of blood received in culture bottles Performed at [facility] [address] [place],  [place] [address]    Culture   Final    NO GROWTH 4 DAYS Performed at [facility], [place] [address], [place], [place] [address]    Report Status PENDING  Incomplete  Resp Panel by RT-PCR (Flu A&B, Covid) Nasopharyngeal Swab     Status: None   Collection Time: 04/05/2021 10:41 PM   Specimen: Nasopharyngeal Swab;  Nasopharyngeal(NP) swabs in vial transport medium  Result Value Ref Range Status   SARS Coronavirus 2 by RT PCR NEGATIVE NEGATIVE Final    Comment: (NOTE) SARS-CoV-2 target nucleic acids are NOT DETECTED.  The SARS-CoV-2 RNA is generally detectable in upper respiratory specimens during the acute phase of infection. The lowest concentration of SARS-CoV-2 viral copies this assay can detect is 138 copies/mL. A negative result does not preclude SARS-Cov-2 infection and should not be used as the sole basis for treatment or other patient management decisions. A negative result may occur with  improper specimen collection/handling, submission of specimen other than nasopharyngeal swab, presence of viral mutation(s) within the areas targeted by this assay, and inadequate number of viral copies(<138 copies/mL). A negative result must be combined with clinical observations, patient history, and epidemiological information. The expected result is Negative.  Fact Sheet for Patients:  EntrepreneurPulse.com.au  Fact Sheet for Healthcare Providers:  IncredibleEmployment.be  This test is no t yet approved or cleared by the [place] FDA and  has been authorized for detection and/or diagnosis of SARS-CoV-2 by FDA under an Emergency Use Authorization (EUA). This EUA will remain  in effect (meaning this test can be used) for the duration of the COVID-19 declaration under Section 564(b)(1) of the Act, 21 U.S.C.section 360bbb-3(b)(1), unless the authorization is terminated  or revoked sooner.       Influenza A by PCR NEGATIVE NEGATIVE Final   Influenza B by PCR NEGATIVE NEGATIVE Final    Comment: (NOTE) The Xpert Xpress SARS-CoV-2/FLU/RSV plus assay is intended as an aid in the diagnosis of influenza from Nasopharyngeal swab specimens and should not be used as a sole basis for treatment. Nasal washings and aspirates are unacceptable for Xpert Xpress  SARS-CoV-2/FLU/RSV testing.  Fact Sheet for Patients: EntrepreneurPulse.com.au  Fact Sheet for Healthcare Providers: IncredibleEmployment.be  This test is not yet approved or cleared by the [place] FDA and has been authorized for detection and/or diagnosis of SARS-CoV-2  by FDA under an Emergency Use Authorization (EUA). This EUA will remain in effect (meaning this test can be used) for the duration of the COVID-19 declaration under Section 564(b)(1) of the Act, 21 U.S.C. section 360bbb-3(b)(1), unless the authorization is terminated or revoked.  Performed at [facility], [place] [address], [place], [place] [address]   Aerobic/Anaerobic Culture w Gram Stain (surgical/deep wound)     Status: None (Preliminary result)   Collection Time: 04/04/2021 11:23 PM   Specimen: Abdomen; Wound  Result Value Ref Range Status   Specimen Description   Final    ABDOMEN Performed at [facility] [address], [place], [place] [address]    Special Requests   Final    NONE Performed at [facility], [place] [address] [place], [place] [address]    Gram Stain   Final    FEW SQUAMOUS EPITHELIAL CELLS PRESENT FEW WBC SEEN MODERATE GRAM POSITIVE COCCI Performed at [facility], [place] [facility] [place] [address] [place], [place] [address]    Culture   Final    MODERATE METHICILLIN RESISTANT STAPHYLOCOCCUS AUREUS FEW CANDIDA ALBICANS NO ANAEROBES ISOLATED; CULTURE IN PROGRESS FOR 5 DAYS    Report Status PENDING  Incomplete   Organism ID, Bacteria METHICILLIN RESISTANT STAPHYLOCOCCUS AUREUS  Final      Susceptibility   Methicillin resistant staphylococcus aureus - MIC*    CIPROFLOXACIN >=8 RESISTANT Resistant     ERYTHROMYCIN >=8 RESISTANT Resistant     GENTAMICIN <=0.5 SENSITIVE Sensitive     OXACILLIN >=4 RESISTANT Resistant     TETRACYCLINE <=1 SENSITIVE Sensitive     VANCOMYCIN <=0.5 SENSITIVE Sensitive      TRIMETH/SULFA <=10 SENSITIVE Sensitive     CLINDAMYCIN <=0.25 SENSITIVE Sensitive     RIFAMPIN <=0.5 SENSITIVE Sensitive     Inducible Clindamycin NEGATIVE Sensitive     * MODERATE METHICILLIN RESISTANT STAPHYLOCOCCUS AUREUS  Aerobic Culture w Gram Stain (superficial specimen)     Status: None   Collection Time: 03/11/2021 11:23 PM   Specimen: Abdomen; Wound  Result Value Ref Range Status   Specimen Description   Final    ABDOMEN Performed at [facility] [address] [place], [place] [address]    Special Requests   Final    NONE Performed at [facility], [place] [address] [place], [place] [address]    Gram Stain   Final    NO SQUAMOUS EPITHELIAL CELLS SEEN FEW WBC SEEN NO ORGANISMS SEEN Performed at [facility], [place] [address] [place], [place] [address]    Culture   Final    FEW METHICILLIN RESISTANT STAPHYLOCOCCUS AUREUS FEW ENTEROCOCCUS FAECALIS    Report Status 04/10/2021 FINAL  Final   Organism ID, Bacteria METHICILLIN RESISTANT STAPHYLOCOCCUS AUREUS  Final   Organism ID, Bacteria ENTEROCOCCUS FAECALIS  Final      Susceptibility   Enterococcus faecalis - MIC*    AMPICILLIN <=2 SENSITIVE Sensitive     VANCOMYCIN 1 SENSITIVE Sensitive     GENTAMICIN SYNERGY SENSITIVE Sensitive     * FEW ENTEROCOCCUS FAECALIS   Methicillin resistant staphylococcus aureus - MIC*    CIPROFLOXACIN >=8 RESISTANT Resistant     ERYTHROMYCIN >=8 RESISTANT Resistant     GENTAMICIN <=0.5 SENSITIVE Sensitive     OXACILLIN >=4 RESISTANT Resistant     TETRACYCLINE <=1 SENSITIVE Sensitive     VANCOMYCIN <=0.5 SENSITIVE Sensitive     TRIMETH/SULFA <=10 SENSITIVE Sensitive  CLINDAMYCIN <=0.25 SENSITIVE Sensitive     RIFAMPIN <=0.5 SENSITIVE Sensitive     Inducible Clindamycin NEGATIVE Sensitive     * FEW METHICILLIN RESISTANT STAPHYLOCOCCUS AUREUS         Radiology Studies: No results found.      Scheduled Meds:  famotidine  20 mg Oral  Daily   metroNIDAZOLE   Topical BID   mirtazapine  15 mg Oral QHS   nystatin   Topical TID   polyethylene glycol  17 g Oral Daily   potassium chloride  20 mEq Oral Once   senna-docusate  1 tablet Oral BID   silver sulfADIAZINE   Topical Daily   Continuous Infusions:  dextrose 5% lactated ringers        LOS: 4 days   Time spent: 96mins, case discussed with palliative care Dr Rowe Pavy through secure chat Greater than 50% of this time was spent in counseling, explanation of diagnosis, planning of further management, and coordination of care.   Voice Recognition Viviann Spare dictation system was used to create this note, attempts have been made to correct errors. Please contact the author with questions and/or clarifications.   Florencia Reasons, MD PhD FACP [facility]  Available via Epic secure chat 7am-7pm for nonurgent issues Please page for urgent issues To page the attending provider between 7A-7P or the covering provider during after hours 7P-7A, please log into the web site [email or web address] and access using universal [place] password for that web site. If you do not have the password, please call the hospital operator.    04/11/2021, 8:25 PM

## 2021-04-11 NOTE — Progress Notes (Signed)
   04/11/21 1555  Vitals  Temp 99.2 F (37.3 C)  Temp Source Oral  BP (!) 92/52  MAP (mmHg) (!) 63  BP Location Left Arm  BP Method Automatic  Patient Position (if appropriate) Lying  Pulse Rate 99  Pulse Rate Source Monitor  Resp 20  MEWS COLOR  MEWS Score Color Green  Oxygen Therapy  SpO2 (!) 88 %  MEWS Score  MEWS Temp 0  MEWS Systolic 1  MEWS Pulse 0  MEWS RR 0  MEWS LOC 0  MEWS Score 1   MD made aware

## 2021-04-12 DIAGNOSIS — K5641 Fecal impaction: Secondary | ICD-10-CM

## 2021-04-12 DIAGNOSIS — Z515 Encounter for palliative care: Secondary | ICD-10-CM | POA: Diagnosis not present

## 2021-04-12 DIAGNOSIS — R400 Somnolence: Secondary | ICD-10-CM | POA: Diagnosis not present

## 2021-04-12 DIAGNOSIS — T8189XA Other complications of procedures, not elsewhere classified, initial encounter: Secondary | ICD-10-CM | POA: Diagnosis not present

## 2021-04-12 DIAGNOSIS — L309 Dermatitis, unspecified: Secondary | ICD-10-CM | POA: Diagnosis not present

## 2021-04-12 DIAGNOSIS — M8668 Other chronic osteomyelitis, other site: Secondary | ICD-10-CM | POA: Diagnosis not present

## 2021-04-12 DIAGNOSIS — I5032 Chronic diastolic (congestive) heart failure: Secondary | ICD-10-CM | POA: Diagnosis not present

## 2021-04-12 LAB — AEROBIC/ANAEROBIC CULTURE W GRAM STAIN (SURGICAL/DEEP WOUND)

## 2021-04-12 LAB — CULTURE, BLOOD (ROUTINE X 2): Culture: NO GROWTH

## 2021-04-12 NOTE — Progress Notes (Addendum)
PROGRESS NOTE    EVANTHIA [name]  ZHY:[id number] DOB: 10/18/1930 DOA: 03/11/2021 PCP: Hoyt Koch, MD    Brief Narrative/Hospital course:  [name] [name] is a 87 y.o. female with past medical history of congestive heart failure, chronic nonhealing abdominal wound, history of DVT and pulmonary embolism, history of coronary artery disease presented to the hospital from skilled nursing facility with altered mental status.  Patient did have progressive lethargy, fever at the skilled nursing facility and was admitted hospital for further evaluation and treatment.   Assessment & Plan:   Principal Problem:   AMS (altered mental status) Active Problems:   Chronic diastolic CHF (congestive heart failure) (HCC)   Chronic osteomyelitis of sacrum (HCC)   Fecal impaction (HCC)   Leukocytosis   Chronic wound infection of abdomen   Chronic dermatitis   Nonhealing surgical wound, initial encounter   Palliative care by specialist   Goals of care, counseling/discussion   General weakness  Acute metabolic encephalopathy Patient presented with  with lethargy and fever at the skilled nursing facility.  Possibly multifactorial from infection/ dehydration/ oversedation.  Patient was recently started on broad-spectrum antibiotic on admission due to chronic sacral osteomyelitis.  Abdominal wound showed staph aureus but no wound does not appear to be infected.  Blood cultures no growth so far.  ID was consulted who recommended stopping all antibiotics.  Patient was then initiated on comfort care.  Nonhealing abdominal wound/poor nutrition status Patient was also seen by general surgery who recommended wound care.  At this time, no plan for PEG tube placement after the previous provider had spoken with the patient's family.   Sacral decubitus ulcer  Present on admission.  Continue wound care.  Hypomagnesemia/hypokalemia Mild.  No recent labs.  Was replenished.  Hypoglycemia D5 LR at this time.   Poor oral intake.  Interstitial pulmonary fibrosis/chronic hypoxic respiratory failure on home O2 2 L/immunosuppressed status, patient was on chronic prednisone, and CellCept  FTT, poor prognosis, palliative care consulted.  Follow recommendations.  DVT prophylaxis: Place and maintain sequential compression device Start: 04/09/21 2223  Code Status: DNR  Family Communication:  None today.  Disposition:   Status is: Inpatient  Dispo: The patient is from: SNF              Anticipated d/c is to: SNF ( family is looking at [facility])              Anticipated d/c date is: SNF with hospice vs hospice facility vs in hospital death                 Consultants:  General surgery ID Palliative care Wound care IR  Procedures:  none  Antimicrobials:   Metronidazole gel  Anti-infectives (From admission, onward)    Start     Dose/Rate Route Frequency Ordered Stop   04/08/21 1700  meropenem (MERREM) 1 g in sodium chloride 0.9 % 100 mL IVPB  Status:  Discontinued        1 g 200 mL/hr over 30 Minutes Intravenous Every 12 hours 04/08/21 0750 04/08/21 1125   04/08/21 1200  vancomycin (VANCOREADY) IVPB 500 mg/100 mL  Status:  Discontinued        500 mg 100 mL/hr over 60 Minutes Intravenous Every 24 hours 04/07/21 0717 04/08/21 1125   04/07/21 1200  meropenem (MERREM) 1 g in sodium chloride 0.9 % 100 mL IVPB  Status:  Discontinued        1 g 200 mL/hr over  30 Minutes Intravenous Every 8 hours 04/07/21 0952 04/08/21 0750   04/07/21 0700  vancomycin (VANCOCIN) IVPB 1000 mg/200 mL premix        1,000 mg 200 mL/hr over 60 Minutes Intravenous  Once 04/07/21 0649 04/07/21 0758      Subjective: Today, patient was seen and examined at bedside.  Patient appears weak and debilitated.  Objective: Vitals:   04/11/21 1632 04/11/21 1738 04/11/21 2056 04/12/21 0123  BP: (!) 90/48 120/66 122/80 135/79  Pulse:  (!) 108 (!) 110 (!) 101  Resp:  20 20 16   Temp:  99.2 F (37.3 C) (!) 97.5 F  (36.4 C) 99.2 F (37.3 C)  TempSrc:   Oral Oral  SpO2:  98%  95%  Height:        Intake/Output Summary (Last 24 hours) at 04/12/2021 1251 Last data filed at 04/12/2021 0400 Gross per 24 hour  Intake 1780.15 ml  Output --  Net 1780.15 ml    There were no vitals filed for this visit.  Physical examination:  General: Appears frail weak slow to respond,, not in obvious distress HENT:   No scleral pallor or icterus noted. Oral mucosa is dry Chest:  Clear breath sounds.  Diminished breath sounds bilaterally. No crackles or wheezes.  CVS: S1 &S2 heard. No murmur.  Regular rate and rhythm. Abdomen: Soft, nontender, nondistended.  Femoral wound with dressing in place.  Bowel sounds are heard.   Extremities: No cyanosis, clubbing or edema.  Peripheral pulses are palpable. Psych: Alert, awake and communicative, mildly sleepy CNS:  No cranial nerve deficits.  Moving all extremities Skin: Sacral wound, abdominal wound         Data Reviewed: I have personally reviewed the following labs and imaging studies.    CBC: Recent Labs  Lab 03/26/2021 2216 04/08/21 0436 04/09/21 0828 04/10/21 0553  WBC 16.0* 16.3* 16.8* 18.1*  NEUTROABS 9.7*  --   --  13.6*  HGB 12.6 9.5* 9.5* 10.0*  HCT 41.3 30.1* 30.9* 31.4*  MCV 95.6 94.4 94.8 94.0  PLT 328 244 239 268     Basic Metabolic Panel: Recent Labs  Lab 03/26/2021 2216 04/08/21 0436 04/09/21 0828 04/10/21 0553  NA 146* 140 144 143  K 4.0 3.2* 3.7 3.4*  CL 104 105 108 106  CO2 33* 29 31 31   GLUCOSE 106* 66* 111* 79  BUN 15 11 7* 8  CREATININE 0.73 0.48 0.49 0.46  CALCIUM 9.6 8.2* 8.5* 8.5*  MG  --  1.6* 1.8 1.6*     GFR: CrCl cannot be calculated (Unknown ideal weight.).  Liver Function Tests: Recent Labs  Lab 03/20/2021 2216 04/09/21 0828  AST 16 22  ALT 10 9  ALKPHOS 120 101  BILITOT 0.7 0.6  PROT 7.7 5.6*  ALBUMIN 2.5* 1.9*     CBG: Recent Labs  Lab 04/08/21 0908 04/08/21 1124  GLUCAP 51* 126*       Recent Results (from the past 240 hour(s))  Culture, blood (Routine x 2)     Status: None   Collection Time: 03/13/2021 10:16 PM   Specimen: BLOOD LEFT FOREARM  Result Value Ref Range Status   Specimen Description   Final    BLOOD LEFT FOREARM Performed at [facility] [address], [place], [place] [address]    Special Requests   Final    BOTTLES DRAWN AEROBIC AND ANAEROBIC Blood Culture results may not be optimal due to an excessive volume of blood received in culture  bottles Performed at [facility] [address] [place], [place] [address]    Culture   Final    NO GROWTH 5 DAYS Performed at [facility], [place] [address] [place], [place] [address]    Report Status 04/12/2021 FINAL  Final  Resp Panel by RT-PCR (Flu A&B, Covid) Nasopharyngeal Swab     Status: None   Collection Time: 04/05/2021 10:41 PM   Specimen: Nasopharyngeal Swab; Nasopharyngeal(NP) swabs in vial transport medium  Result Value Ref Range Status   SARS Coronavirus 2 by RT PCR NEGATIVE NEGATIVE Final    Comment: (NOTE) SARS-CoV-2 target nucleic acids are NOT DETECTED.  The SARS-CoV-2 RNA is generally detectable in upper respiratory specimens during the acute phase of infection. The lowest concentration of SARS-CoV-2 viral copies this assay can detect is 138 copies/mL. A negative result does not preclude SARS-Cov-2 infection and should not be used as the sole basis for treatment or other patient management decisions. A negative result may occur with  improper specimen collection/handling, submission of specimen other than nasopharyngeal swab, presence of viral mutation(s) within the areas targeted by this assay, and inadequate number of viral copies(<138 copies/mL). A negative result must be combined with clinical observations, patient history, and epidemiological information. The expected result is Negative.  Fact Sheet for Patients:   EntrepreneurPulse.com.au  Fact Sheet for Healthcare Providers:  IncredibleEmployment.be  This test is no t yet approved or cleared by the [place] FDA and  has been authorized for detection and/or diagnosis of SARS-CoV-2 by FDA under an Emergency Use Authorization (EUA). This EUA will remain  in effect (meaning this test can be used) for the duration of the COVID-19 declaration under Section 564(b)(1) of the Act, 21 U.S.C.section 360bbb-3(b)(1), unless the authorization is terminated  or revoked sooner.       Influenza A by PCR NEGATIVE NEGATIVE Final   Influenza B by PCR NEGATIVE NEGATIVE Final    Comment: (NOTE) The Xpert Xpress SARS-CoV-2/FLU/RSV plus assay is intended as an aid in the diagnosis of influenza from Nasopharyngeal swab specimens and should not be used as a sole basis for treatment. Nasal washings and aspirates are unacceptable for Xpert Xpress SARS-CoV-2/FLU/RSV testing.  Fact Sheet for Patients: EntrepreneurPulse.com.au  Fact Sheet for Healthcare Providers: IncredibleEmployment.be  This test is not yet approved or cleared by the [place] FDA and has been authorized for detection and/or diagnosis of SARS-CoV-2 by FDA under an Emergency Use Authorization (EUA). This EUA will remain in effect (meaning this test can be used) for the duration of the COVID-19 declaration under Section 564(b)(1) of the Act, 21 U.S.C. section 360bbb-3(b)(1), unless the authorization is terminated or revoked.  Performed at [facility], [place] [address], [place], [place] [address]   Aerobic/Anaerobic Culture w Gram Stain (surgical/deep wound)     Status: None   Collection Time: 03/29/2021 11:23 PM   Specimen: Abdomen; Wound  Result Value Ref Range Status   Specimen Description   Final    ABDOMEN Performed at [facility] [address], [place],   [address]    Special Requests   Final    NONE Performed at [facility], [place] [address] [place], [place] [address]    Gram Stain   Final    FEW SQUAMOUS EPITHELIAL CELLS PRESENT FEW WBC SEEN MODERATE GRAM POSITIVE COCCI    Culture   Final    MODERATE METHICILLIN RESISTANT STAPHYLOCOCCUS AUREUS FEW CANDIDA ALBICANS NO ANAEROBES  ISOLATED Performed at [facility], [place] [address] [place], [place] [address]    Report Status 04/12/2021 FINAL  Final   Organism ID, Bacteria METHICILLIN RESISTANT STAPHYLOCOCCUS AUREUS  Final      Susceptibility   Methicillin resistant staphylococcus aureus - MIC*    CIPROFLOXACIN >=8 RESISTANT Resistant     ERYTHROMYCIN >=8 RESISTANT Resistant     GENTAMICIN <=0.5 SENSITIVE Sensitive     OXACILLIN >=4 RESISTANT Resistant     TETRACYCLINE <=1 SENSITIVE Sensitive     VANCOMYCIN <=0.5 SENSITIVE Sensitive     TRIMETH/SULFA <=10 SENSITIVE Sensitive     CLINDAMYCIN <=0.25 SENSITIVE Sensitive     RIFAMPIN <=0.5 SENSITIVE Sensitive     Inducible Clindamycin NEGATIVE Sensitive     * MODERATE METHICILLIN RESISTANT STAPHYLOCOCCUS AUREUS  Aerobic Culture w Gram Stain (superficial specimen)     Status: None   Collection Time: 03/17/2021 11:23 PM   Specimen: Abdomen; Wound  Result Value Ref Range Status   Specimen Description   Final    ABDOMEN Performed at [facility] [address], [place], [place] [address]    Special Requests   Final    NONE Performed at [facility], [place] [address], [place], [place] [address]    Gram Stain   Final    NO SQUAMOUS EPITHELIAL CELLS SEEN FEW WBC SEEN NO ORGANISMS SEEN Performed at [facility], [place] [address] [place], [place] [address]    Culture   Final    FEW METHICILLIN RESISTANT STAPHYLOCOCCUS AUREUS FEW ENTEROCOCCUS FAECALIS    Report Status 04/10/2021 FINAL  Final   Organism ID, Bacteria METHICILLIN RESISTANT STAPHYLOCOCCUS AUREUS  Final    Organism ID, Bacteria ENTEROCOCCUS FAECALIS  Final      Susceptibility   Enterococcus faecalis - MIC*    AMPICILLIN <=2 SENSITIVE Sensitive     VANCOMYCIN 1 SENSITIVE Sensitive     GENTAMICIN SYNERGY SENSITIVE Sensitive     * FEW ENTEROCOCCUS FAECALIS   Methicillin resistant staphylococcus aureus - MIC*    CIPROFLOXACIN >=8 RESISTANT Resistant     ERYTHROMYCIN >=8 RESISTANT Resistant     GENTAMICIN <=0.5 SENSITIVE Sensitive     OXACILLIN >=4 RESISTANT Resistant     TETRACYCLINE <=1 SENSITIVE Sensitive     VANCOMYCIN <=0.5 SENSITIVE Sensitive     TRIMETH/SULFA <=10 SENSITIVE Sensitive     CLINDAMYCIN <=0.25 SENSITIVE Sensitive     RIFAMPIN <=0.5 SENSITIVE Sensitive     Inducible Clindamycin NEGATIVE Sensitive     * FEW METHICILLIN RESISTANT STAPHYLOCOCCUS AUREUS     Radiology Studies: No results found.   Scheduled Meds:  famotidine  20 mg Oral Daily   metroNIDAZOLE   Topical BID   mirtazapine  15 mg Oral QHS   nystatin   Topical TID   polyethylene glycol  17 g Oral Daily   potassium chloride  20 mEq Oral Once   senna-docusate  1 tablet Oral BID   silver sulfADIAZINE   Topical Daily   Continuous Infusions:  dextrose 5% lactated ringers 75 mL/hr at 04/12/21 0245     LOS: 5 days    Flora Lipps, MD  [facility] 04/12/2021, 12:51 PM

## 2021-04-12 NOTE — Progress Notes (Signed)
Daily Progress Note   Patient Name: [name] [name]       Date: 04/12/2021 DOB: Sep 11, 1930  Age: 87 y.o. MRN#: [id number] Attending Physician: Flora Lipps, MD Primary Care Physician: Hoyt Koch, MD Admit Date: 03/17/2021  Reason for Consultation/Follow-up: Establishing goals of care  Subjective:  Asleep, resting in bed.  Appears comfortable overall, does not verbalize.  Chart reviewed, call placed and discussed with nephew [name], continue comfort measures, see below.  Length of Stay: 5  Current Medications: Scheduled Meds:   famotidine  20 mg Oral Daily   metroNIDAZOLE   Topical BID   mirtazapine  15 mg Oral QHS   nystatin   Topical TID   polyethylene glycol  17 g Oral Daily   potassium chloride  20 mEq Oral Once   senna-docusate  1 tablet Oral BID   silver sulfADIAZINE   Topical Daily    Continuous Infusions:  dextrose 5% lactated ringers 75 mL/hr at 04/12/21 0245    PRN Meds: acetaminophen **OR** acetaminophen, diphenhydrAMINE-zinc acetate, HYDROmorphone (DILAUDID) injection, liver oil-zinc oxide, ondansetron **OR** ondansetron (ZOFRAN) IV, oxyCODONE  Physical Exam         Asleep, appears comfortable. Appears frail Regular work of breathing Abdominal images viewed on chart Interactive No distress  Vital Signs: BP 135/79 (BP Location: Right Arm)   Pulse (!) 101   Temp 99.2 F (37.3 C) (Oral)   Resp 16   Ht 5\' 4"  (1.626 m)   SpO2 95%   BMI 17.16 kg/m  SpO2: SpO2: 95 % O2 Device: O2 Device: Room Air O2 Flow Rate: O2 Flow Rate (L/min): 2 L/min  Intake/output summary:  Intake/Output Summary (Last 24 hours) at 04/12/2021 1412 Last data filed at 04/12/2021 0400 Gross per 24 hour  Intake 1780.15 ml  Output --  Net 1780.15 ml    LBM: Last BM Date:  04/11/21 Baseline Weight:   Most recent weight:         Palliative Assessment/Data:      Patient Active Problem List   Diagnosis Date Noted   Nonhealing surgical wound, initial encounter    Palliative care by specialist    Goals of care, counseling/discussion    General weakness    Chronic dermatitis    AMS (altered mental status) 04/07/2021   Chronic osteomyelitis of sacrum ([place]) 04/07/2021  Fecal impaction ([place]) 04/07/2021   Leukocytosis 04/07/2021   Chronic wound infection of abdomen 04/07/2021   Debility 11/04/2020   Dysphagia    Oral thrush    Encounter for dental examination    Facial swelling    Atrophy of edentulous alveolar ridge    Abdominal wall cellulitis 10/24/2020   Severe sepsis ([place]) 10/23/2020   Abdominal pain 10/20/2020   Diarrhea 10/02/2020   Pressure injury of skin 09/06/2020   Acute on chronic respiratory failure with hypoxemia (HCC) 08/23/2020   Protein-calorie malnutrition, severe 08/20/2020   Fall 01/25/2020   Other fatigue 01/03/2020   Left shoulder pain 01/03/2020   Urinary frequency 01/03/2020   Acute otalgia, left 09/06/2019   Unintentional weight loss 07/18/2019   Personal history of PE (pulmonary embolism) 04/23/2019   Headache 02/11/2019   Iron deficiency anemia 12/21/2018   Cold sore 10/23/2018   Blood in stool 09/14/2018   Guttate psoriasis 07/19/2018   Therapeutic drug monitoring 07/17/2018   Chronic diastolic CHF (congestive heart failure) ([place]) 12/19/2017   Rash 08/11/2017   Chronic respiratory failure with hypoxia ([place]) 03/30/2017   Angular cheilitis 03/08/2017   Leg pain 10/25/2016   Back pain 08/05/2016   RA (rheumatoid arthritis) ([place]) 03/31/2015   Allergic rhinitis    Varicose vein 09/11/2014   ILD (interstitial lung disease) ([place]) 06/25/2012   Chest pain 02/27/2012   CAD (coronary artery disease) 02/27/2012   Pruritus 08/18/2009   Postinflammatory pulmonary fibrosis / RA ILD  06/30/2008   Constipation 01/03/2008    Dyslipidemia 06/16/2007    Palliative Care Assessment & Plan   Patient Profile:  87 year old lady known to palliative medicine service from previous hospitalization, see surgery in outpatient setting.  Small bowel obstruction in February for which he underwent ex lap with detorsion of her small bowel alongside placement of G-tube.  Prolonged postoperative course after that and was discharged to SNF.  Several recurrent admissions for weakness failure to thrive as well as leakage around her G-tube.  G-tube replaced by interventional radiology in July for concern for clogging.  Patient removed G-tube in August and has had a significant excoriation and wound secondary to leakage.  Patient admitted to hospital medicine service for abdominal wound and failure to thrive. Chart reviewed.  Patient is seen by [facility] staff at her nursing facility.  Her CODE STATUS is full code.  She has been seen and evaluated by general surgery as well as infectious disease and speech therapy. Palliative medicine consultation has been requested for CODE STATUS and goals of care discussions.  Assessment:  Leakage at prior gastrostomy site Generalized weakness Generalized pain  Recommendations/Plan: Agree with comfort measures. Continue current mode of care Prognosis probably some very limited number of days Anticipated hospital death Call placed and discussed with nephew [name].  He endorses the family's decision to continue comfort measures and realized that the patient has markedly limited prognosis.  Offered active listening and supportive care, end-of-life signs and symptoms discussed.  Goals of Care and Additional Recommendations: Limitations on Scope of Treatment: Conferred measures  Code Status: Now DNR and comfort measures.    Code Status Orders  (From admission, onward)           Start     Ordered   04/07/21 0645  Full code  Continuous        04/07/21 0644           Code  Status History     Date Active Date Inactive Code Status  Order ID Comments User Context   11/04/2020 1251 11/23/2020 1545 Full Code [id number]  Elizabeth Sauer Inpatient   11/04/2020 1251 11/04/2020 1251 Full Code [id number]  Elizabeth Sauer Inpatient   10/23/2020 1937 11/04/2020 1246 Full Code [id number]  Patrecia Pour, MD Inpatient   08/17/2020 1413 09/08/2020 1521 Full Code [id number]  Jonnie Finner, DO ED   01/25/2020 0454 01/27/2020 1819 Full Code [id number]  Chauncey Mann, MD Inpatient   06/17/2013 0948 06/19/2013 1517 Full Code [id number]  Burtis Junes, NP Inpatient   12/31/2012 1902 01/02/2013 1526 Full Code [id number]  Elsie Stain, MD Inpatient   12/31/2012 1736 12/31/2012 1902 Full Code [id number]  Juanito Doom, MD Outpatient       Prognosis: Hours to days  Discharge Planning: Dissipated hospital death.  Care plan was discussed with  nephew.   Thank you for allowing the Palliative Medicine Team to assist in the care of this patient.   Time In: 1300 Time Out: 1335 Total Time 35 Prolonged Time Billed  no       Greater than 50%  of this time was spent counseling and coordinating care related to the above assessment and plan.  Loistine Chance, MD  Please contact Palliative Medicine Team phone at [phone] for questions and concerns.

## 2021-04-12 NOTE — Consult Note (Signed)
WOC consulted for patient's sacral wound, please see extensive consult note from my partner Val Riles [facility] nurse from last week. Verified orders are in for each wound.  Will not consult again during this admission for that reason.   [place], [place], [place]

## 2021-04-13 DIAGNOSIS — L309 Dermatitis, unspecified: Secondary | ICD-10-CM | POA: Diagnosis not present

## 2021-04-13 DIAGNOSIS — R400 Somnolence: Secondary | ICD-10-CM | POA: Diagnosis not present

## 2021-04-13 DIAGNOSIS — Z515 Encounter for palliative care: Secondary | ICD-10-CM | POA: Diagnosis not present

## 2021-04-13 DIAGNOSIS — I5032 Chronic diastolic (congestive) heart failure: Secondary | ICD-10-CM | POA: Diagnosis not present

## 2021-04-13 DIAGNOSIS — M8668 Other chronic osteomyelitis, other site: Secondary | ICD-10-CM | POA: Diagnosis not present

## 2021-04-13 NOTE — Progress Notes (Signed)
PROGRESS NOTE    [name]  NLZ:[id number] DOB: [birth year] DOA: 04/03/2021 PCP: Hoyt Koch, MD    Brief Narrative/Hospital course:   ELLSIE [name] is a 87 y.o. female with past medical history of congestive heart failure, chronic nonhealing abdominal wound, history of DVT and pulmonary embolism, history of coronary artery disease presented to the hospital from skilled nursing facility with altered mental status.  Patient did have progressive lethargy, fever at the skilled nursing facility and was admitted hospital for further evaluation and treatment.   During hospitalization, palliative care has seen the patient and currently undergoing comfort care.  Assessment & Plan:   Principal Problem:   AMS (altered mental status) Active Problems:   Chronic diastolic CHF (congestive heart failure) (HCC)   Chronic osteomyelitis of sacrum (HCC)   Fecal impaction (HCC)   Leukocytosis   Chronic wound infection of abdomen   Chronic dermatitis   Nonhealing surgical wound, initial encounter   Palliative care by specialist   Goals of care, counseling/discussion   General weakness  Acute metabolic encephalopathy Patient presented with  with lethargy and fever at the skilled nursing facility.  Possibly multifactorial from infection/ dehydration/ oversedation.  Patient was recently started on broad-spectrum antibiotic on admission due to chronic sacral osteomyelitis.  Abdominal wound showed staph aureus but no wound does not appear to be infected.  Blood cultures no growth so far.  ID was consulted who recommended stopping all antibiotics.  Patient was then initiated on comfort care.  Patient is alert awake and mildly communicative.  Appears very feeble and deconditioned.  Nonhealing abdominal wound/poor nutrition status Patient was also seen by general surgery who recommended wound care.  At this time, no plan for PEG tube placement after the previous provider had spoken with the  patient's family.  Undergoing comfort care.  Sacral decubitus ulcer  Present on admission.  Continue wound care for comfort..  Hypomagnesemia/hypokalemia Mild.  No recent labs.  Was replenished.  Hypoglycemia Was on D5 LR.  Currently off IV fluids for comfort.  Interstitial pulmonary fibrosis/chronic hypoxic respiratory failure on home O2 2 L/immunosuppressed status, patient was on chronic prednisone, and CellCept as outpatient.  Currently on comfort care.  FTT, poor prognosis, palliative care on board, undergoing comfort.  DVT prophylaxis: Place and maintain sequential compression device Start: 04/09/21 2223  Code Status: DNR  Family Communication:  None  Disposition:   Status is: Inpatient  Dispo: The patient is from: SNF              Anticipated d/c is to: Possible in-hospital death                    Consultants:  General surgery ID Palliative care Wound care IR  Procedures:  none  Antimicrobials:   Metronidazole gel  Subjective: Today, patient was seen and examined at bedside.  Patient any pain and breathing issues.  Objective: Vitals:   04/11/21 1738 04/11/21 2056 04/12/21 0123 04/12/21 1948  BP: 120/66 122/80 135/79 135/73  Pulse: (!) 108 (!) 110 (!) 101 99  Resp: 20 20 16 18   Temp: 99.2 F (37.3 C) (!) 97.5 F (36.4 C) 99.2 F (37.3 C) 98.2 F (36.8 C)  TempSrc:  Oral Oral Axillary  SpO2: 98%  95% 94%  Height:       No intake or output data in the 24 hours ending 04/13/21 1150  There were no vitals filed for this visit.  Physical examination: General: Frail appearing slow  to respond elderly female appears feeble and weak HENT:   No scleral pallor or icterus noted. Oral mucosa is dry Chest:    Diminished breath sounds bilaterally. CVS: S1 &S2 heard. No murmur.  Regular rate and rhythm. Abdomen: Soft, nontender, nondistended.  wound with dressing in place.  Bowel sounds are heard.   Extremities: No cyanosis, clubbing or edema.  Peripheral  pulses are palpable. Psych: Alert, awake and communicative,  CNS:  No cranial nerve deficits.  Moving all extremities Skin: Sacral wound, abdominal wound         Data Reviewed: I have personally reviewed the following labs and imaging studies.    CBC: Recent Labs  Lab 03/24/2021 2216 04/08/21 0436 04/09/21 0828 04/10/21 0553  WBC 16.0* 16.3* 16.8* 18.1*  NEUTROABS 9.7*  --   --  13.6*  HGB 12.6 9.5* 9.5* 10.0*  HCT 41.3 30.1* 30.9* 31.4*  MCV 95.6 94.4 94.8 94.0  PLT 328 244 239 268     Basic Metabolic Panel: Recent Labs  Lab 04/09/2021 2216 04/08/21 0436 04/09/21 0828 04/10/21 0553  NA 146* 140 144 143  K 4.0 3.2* 3.7 3.4*  CL 104 105 108 106  CO2 33* 29 31 31   GLUCOSE 106* 66* 111* 79  BUN 15 11 7* 8  CREATININE 0.73 0.48 0.49 0.46  CALCIUM 9.6 8.2* 8.5* 8.5*  MG  --  1.6* 1.8 1.6*     GFR: CrCl cannot be calculated (Unknown ideal weight.).  Liver Function Tests: Recent Labs  Lab 04/04/2021 2216 04/09/21 0828  AST 16 22  ALT 10 9  ALKPHOS 120 101  BILITOT 0.7 0.6  PROT 7.7 5.6*  ALBUMIN 2.5* 1.9*     CBG: Recent Labs  Lab 04/08/21 0908 04/08/21 1124  GLUCAP 51* 126*      Recent Results (from the past 240 hour(s))  Culture, blood (Routine x 2)     Status: None   Collection Time: 03/24/2021 10:16 PM   Specimen: BLOOD LEFT FOREARM  Result Value Ref Range Status   Specimen Description   Final    BLOOD LEFT FOREARM Performed at [facility] [address], [place], [place] [address]    Special Requests   Final    BOTTLES DRAWN AEROBIC AND ANAEROBIC Blood Culture results may not be optimal due to an excessive volume of blood received in culture bottles Performed at [place] [address] [place], [place] [address]    Culture   Final    NO GROWTH 5 DAYS Performed at [facility], [place] [address] [place], [place] [address]    Report Status 04/12/2021 FINAL  Final  Resp Panel by RT-PCR  (Flu A&B, Covid) Nasopharyngeal Swab     Status: None   Collection Time: 04/03/2021 10:41 PM   Specimen: Nasopharyngeal Swab; Nasopharyngeal(NP) swabs in vial transport medium  Result Value Ref Range Status   SARS Coronavirus 2 by RT PCR NEGATIVE NEGATIVE Final    Comment: (NOTE) SARS-CoV-2 target nucleic acids are NOT DETECTED.  The SARS-CoV-2 RNA is generally detectable in upper respiratory specimens during the acute phase of infection. The lowest concentration of SARS-CoV-2 viral copies this assay can detect is 138 copies/mL. A negative result does not preclude SARS-Cov-2 infection and should not be used as the sole basis for treatment or other patient management decisions. A negative result may occur with  improper specimen collection/handling, submission of specimen other than nasopharyngeal swab, presence of viral mutation(s) within the  areas targeted by this assay, and inadequate number of viral copies(<138 copies/mL). A negative result must be combined with clinical observations, patient history, and epidemiological information. The expected result is Negative.  Fact Sheet for Patients:  EntrepreneurPulse.com.au  Fact Sheet for Healthcare Providers:  IncredibleEmployment.be  This test is no t yet approved or cleared by the [place] FDA and  has been authorized for detection and/or diagnosis of SARS-CoV-2 by FDA under an Emergency Use Authorization (EUA). This EUA will remain  in effect (meaning this test can be used) for the duration of the COVID-19 declaration under Section 564(b)(1) of the Act, 21 U.S.C.section 360bbb-3(b)(1), unless the authorization is terminated  or revoked sooner.       Influenza A by PCR NEGATIVE NEGATIVE Final   Influenza B by PCR NEGATIVE NEGATIVE Final    Comment: (NOTE) The Xpert Xpress SARS-CoV-2/FLU/RSV plus assay is intended as an aid in the diagnosis of influenza from Nasopharyngeal swab specimens  and should not be used as a sole basis for treatment. Nasal washings and aspirates are unacceptable for Xpert Xpress SARS-CoV-2/FLU/RSV testing.  Fact Sheet for Patients: EntrepreneurPulse.com.au  Fact Sheet for Healthcare Providers: IncredibleEmployment.be  This test is not yet approved or cleared by the [place] FDA and has been authorized for detection and/or diagnosis of SARS-CoV-2 by FDA under an Emergency Use Authorization (EUA). This EUA will remain in effect (meaning this test can be used) for the duration of the COVID-19 declaration under Section 564(b)(1) of the Act, 21 U.S.C. section 360bbb-3(b)(1), unless the authorization is terminated or revoked.  Performed at [facility], [place] [address] [place], [place] [address]   Aerobic/Anaerobic Culture w Gram Stain (surgical/deep wound)     Status: None   Collection Time: 03/20/2021 11:23 PM   Specimen: Abdomen; Wound  Result Value Ref Range Status   Specimen Description   Final    ABDOMEN Performed at [facility] [address] [place], [place] [address]    Special Requests   Final    NONE Performed at [facility], [place] [address], [place], [place] [address]    Gram Stain   Final    FEW SQUAMOUS EPITHELIAL CELLS PRESENT FEW WBC SEEN MODERATE GRAM POSITIVE COCCI    Culture   Final    MODERATE METHICILLIN RESISTANT STAPHYLOCOCCUS AUREUS FEW CANDIDA ALBICANS NO ANAEROBES ISOLATED Performed at [facility], [place] [address], [place], [place] [address]    Report Status 04/12/2021 FINAL  Final   Organism ID, Bacteria METHICILLIN RESISTANT STAPHYLOCOCCUS AUREUS  Final      Susceptibility   Methicillin resistant staphylococcus aureus - MIC*    CIPROFLOXACIN >=8 RESISTANT Resistant     ERYTHROMYCIN >=8 RESISTANT Resistant     GENTAMICIN <=0.5 SENSITIVE Sensitive     OXACILLIN >=4 RESISTANT Resistant     TETRACYCLINE  <=1 SENSITIVE Sensitive     VANCOMYCIN <=0.5 SENSITIVE Sensitive     TRIMETH/SULFA <=10 SENSITIVE Sensitive     CLINDAMYCIN <=0.25 SENSITIVE Sensitive     RIFAMPIN <=0.5 SENSITIVE Sensitive     Inducible Clindamycin NEGATIVE Sensitive     * MODERATE METHICILLIN RESISTANT STAPHYLOCOCCUS AUREUS  Aerobic Culture w Gram Stain (superficial specimen)     Status: None   Collection Time: 03/28/2021 11:23 PM   Specimen: Abdomen; Wound  Result Value Ref Range Status   Specimen Description   Final    ABDOMEN Performed at [facility] [address] [place],  [place] [address]    Special Requests   Final    NONE Performed at [facility], [place] [address], [place], [place] [address]    Gram Stain   Final    NO SQUAMOUS EPITHELIAL CELLS SEEN FEW WBC SEEN NO ORGANISMS SEEN Performed at [facility], [place] [address] [place], [place] [address]    Culture   Final    FEW METHICILLIN RESISTANT STAPHYLOCOCCUS AUREUS FEW ENTEROCOCCUS FAECALIS    Report Status 04/10/2021 FINAL  Final   Organism ID, Bacteria METHICILLIN RESISTANT STAPHYLOCOCCUS AUREUS  Final   Organism ID, Bacteria ENTEROCOCCUS FAECALIS  Final      Susceptibility   Enterococcus faecalis - MIC*    AMPICILLIN <=2 SENSITIVE Sensitive     VANCOMYCIN 1 SENSITIVE Sensitive     GENTAMICIN SYNERGY SENSITIVE Sensitive     * FEW ENTEROCOCCUS FAECALIS   Methicillin resistant staphylococcus aureus - MIC*    CIPROFLOXACIN >=8 RESISTANT Resistant     ERYTHROMYCIN >=8 RESISTANT Resistant     GENTAMICIN <=0.5 SENSITIVE Sensitive     OXACILLIN >=4 RESISTANT Resistant     TETRACYCLINE <=1 SENSITIVE Sensitive     VANCOMYCIN <=0.5 SENSITIVE Sensitive     TRIMETH/SULFA <=10 SENSITIVE Sensitive     CLINDAMYCIN <=0.25 SENSITIVE Sensitive     RIFAMPIN <=0.5 SENSITIVE Sensitive     Inducible Clindamycin NEGATIVE Sensitive     * FEW METHICILLIN RESISTANT STAPHYLOCOCCUS AUREUS     Radiology Studies: No  results found.   Scheduled Meds:  famotidine  20 mg Oral Daily   metroNIDAZOLE   Topical BID   mirtazapine  15 mg Oral QHS   nystatin   Topical TID   polyethylene glycol  17 g Oral Daily   potassium chloride  20 mEq Oral Once   senna-docusate  1 tablet Oral BID   silver sulfADIAZINE   Topical Daily   Continuous Infusions:     LOS: 6 days    Flora Lipps, MD  [facility] 04/13/2021, 11:50 AM

## 2021-04-13 NOTE — Progress Notes (Signed)
Daily Progress Note   Patient Name: [name] [name]       Date: 04/13/2021 DOB: 1931-05-19  Age: 87 y.o. MRN#: [id number] Attending Physician: Flora Lipps, MD Primary Care Physician: Hoyt Koch, MD Admit Date: 04/02/2021  Reason for Consultation/Follow-up: Establishing goals of care  Subjective:  Lying in bed in no distress.  Awake and answers a few simple questions (denies pain or SOB).  States, "no" when asked about other needs.  Attempted to call family to discuss.  Unable to reach and left messages for [place] and [name].  Length of Stay: 6  Current Medications: Scheduled Meds:   famotidine  20 mg Oral Daily   metroNIDAZOLE   Topical BID   mirtazapine  15 mg Oral QHS   nystatin   Topical TID   polyethylene glycol  17 g Oral Daily   senna-docusate  1 tablet Oral BID   silver sulfADIAZINE   Topical Daily    Continuous Infusions:    PRN Meds: acetaminophen **OR** acetaminophen, diphenhydrAMINE-zinc acetate, HYDROmorphone (DILAUDID) injection, liver oil-zinc oxide, ondansetron **OR** ondansetron (ZOFRAN) IV, oxyCODONE  Physical Exam         Awake, appears comfortable. Appears frail Regular work of breathing Abdominal images viewed on chart Interactive No distress  Vital Signs: BP 97/71 (BP Location: Right Arm)   Pulse 99   Temp 98.1 F (36.7 C) (Oral)   Resp 18   Ht 5\' 4"  (1.626 m)   SpO2 94%   BMI 17.16 kg/m  SpO2: SpO2: 94 % O2 Device: O2 Device: Nasal Cannula O2 Flow Rate: O2 Flow Rate (L/min): 2 L/min  Intake/output summary: No intake or output data in the 24 hours ending 04/13/21 1452  LBM: Last BM Date: 04/11/21 Baseline Weight:   Most recent weight:         Palliative Assessment/Data:      Patient Active Problem List    Diagnosis Date Noted   Nonhealing surgical wound, initial encounter    Palliative care by specialist    Goals of care, counseling/discussion    General weakness    Chronic dermatitis    AMS (altered mental status) 04/07/2021   Chronic osteomyelitis of sacrum ([place]) 04/07/2021   Fecal impaction ([place]) 04/07/2021   Leukocytosis 04/07/2021   Chronic wound infection of abdomen 04/07/2021   Debility 11/04/2020  Dysphagia    Oral thrush    Encounter for dental examination    Facial swelling    Atrophy of edentulous alveolar ridge    Abdominal wall cellulitis 10/24/2020   Severe sepsis ([place]) 10/23/2020   Abdominal pain 10/20/2020   Diarrhea 10/02/2020   Pressure injury of skin 09/06/2020   Acute on chronic respiratory failure with hypoxemia (HCC) 08/23/2020   Protein-calorie malnutrition, severe 08/20/2020   Fall 01/25/2020   Other fatigue 01/03/2020   Left shoulder pain 01/03/2020   Urinary frequency 01/03/2020   Acute otalgia, left 09/06/2019   Unintentional weight loss 07/18/2019   Personal history of PE (pulmonary embolism) 04/23/2019   Headache 02/11/2019   Iron deficiency anemia 12/21/2018   Cold sore 10/23/2018   Blood in stool 09/14/2018   Guttate psoriasis 07/19/2018   Therapeutic drug monitoring 07/17/2018   Chronic diastolic CHF (congestive heart failure) ([place]) 12/19/2017   Rash 08/11/2017   Chronic respiratory failure with hypoxia ([place]) 03/30/2017   Angular cheilitis 03/08/2017   Leg pain 10/25/2016   Back pain 08/05/2016   RA (rheumatoid arthritis) ([place]) 03/31/2015   Allergic rhinitis    Varicose vein 09/11/2014   ILD (interstitial lung disease) ([place]) 06/25/2012   Chest pain 02/27/2012   CAD (coronary artery disease) 02/27/2012   Pruritus 08/18/2009   Postinflammatory pulmonary fibrosis / RA ILD  06/30/2008   Constipation 01/03/2008   Dyslipidemia 06/16/2007    Palliative Care Assessment & Plan   Patient Profile:  87 year old lady known to palliative  medicine service from previous hospitalization, see surgery in outpatient setting.  Small bowel obstruction in February for which he underwent ex lap with detorsion of her small bowel alongside placement of G-tube.  Prolonged postoperative course after that and was discharged to SNF.  Several recurrent admissions for weakness failure to thrive as well as leakage around her G-tube.  G-tube replaced by interventional radiology in July for concern for clogging.  Patient removed G-tube in August and has had a significant excoriation and wound secondary to leakage.  Patient admitted to hospital medicine service for abdominal wound and failure to thrive. Decision made not to replace G tube and goals moving forward is focus on comfort.  Assessment:  Leakage at prior gastrostomy site Generalized weakness Generalized pain  Recommendations/Plan: Continue comfort measures.  Appears comfortable at time of my examination. She continues to decline overall but has had intermittent periods of lethargy and more wakefulness.  If she remains stable, may be able to consider transition to residential hospice.  Attempted to call family to discuss and left VM.  Goals of Care and Additional Recommendations: Limitations on Scope of Treatment: Comfort measures  Code Status: Now DNR and comfort measures.    Code Status Orders  (From admission, onward)           Start     Ordered   04/07/21 0645  Full code  Continuous        04/07/21 0644           Code Status History     Date Active Date Inactive Code Status Order ID Comments User Context   11/04/2020 1251 11/23/2020 1545 Full Code [id number]  Elizabeth Sauer Inpatient   11/04/2020 1251 11/04/2020 1251 Full Code [id number]  Elizabeth Sauer Inpatient   10/23/2020 1937 11/04/2020 1246 Full Code [id number]  Patrecia Pour, MD Inpatient   08/17/2020 1413 09/08/2020 1521 Full Code [id number]  Jonnie Finner, DO ED   01/25/2020 [phone]  01/27/2020 1819 Full  Code [id number]  Chauncey Mann, MD Inpatient   06/17/2013 0948 06/19/2013 1517 Full Code [id number]  Burtis Junes, NP Inpatient   12/31/2012 1902 01/02/2013 1526 Full Code [id number]  Elsie Stain, MD Inpatient   12/31/2012 1736 12/31/2012 1902 Full Code [id number]  Juanito Doom, MD Outpatient       Prognosis: Hours to days  Discharge Planning: Anticipated hospital death vs residential hospice.  Thank you for allowing the Palliative Medicine Team to assist in the care of this patient.   Time In: 1430 Time Out: 1450 Total Time 20 Prolonged Time Billed  no    Greater than 50%  of this time was spent counseling and coordinating care related to the above assessment and plan.   Micheline Rough, MD  Please contact Palliative Medicine Team phone at [phone] for questions and concerns.

## 2021-04-14 DIAGNOSIS — I5032 Chronic diastolic (congestive) heart failure: Secondary | ICD-10-CM | POA: Diagnosis not present

## 2021-04-14 DIAGNOSIS — L89150 Pressure ulcer of sacral region, unstageable: Secondary | ICD-10-CM | POA: Diagnosis not present

## 2021-04-14 DIAGNOSIS — Z7189 Other specified counseling: Secondary | ICD-10-CM | POA: Diagnosis not present

## 2021-04-14 DIAGNOSIS — Z515 Encounter for palliative care: Secondary | ICD-10-CM | POA: Diagnosis not present

## 2021-04-14 DIAGNOSIS — R531 Weakness: Secondary | ICD-10-CM | POA: Diagnosis not present

## 2021-04-14 DIAGNOSIS — M8668 Other chronic osteomyelitis, other site: Secondary | ICD-10-CM | POA: Diagnosis not present

## 2021-04-14 NOTE — Progress Notes (Signed)
Daily Progress Note   Patient Name: [name] [name]       Date: 04/14/2021 DOB: Aug 26, 1930  Age: 87 y.o. MRN#: [id number] Attending Physician: Annita Brod, MD Primary Care Physician: Hoyt Koch, MD Admit Date: 03/25/2021  Reason for Consultation/Follow-up: Establishing goals of care  Subjective:  Lying in bed in no distress.  She is sleepier today but does nod to some questions.  I called and discussed with patient's nephew, [name].  We talked about the fact that she has been a little more awake and, while this is not a sign that she is going to "improve" overall, it may mean that her prognosis is such that consideration could be made for transitioning from the hospital either home with hospice or to residential hospice.  [name] reports that family does not want to rush things as primary goal is to make sure that she receives the care that she needs and is as comfortable as possible.  They would like some time to see how she does clinically prior to making plans for potential discharge from the hospital.  [name] tells me that consideration may be for transition home with hospice support rather than going to hospice facility (assuming she would be stable enough to discharge from the hospital).  I asked him to discuss further options of home with hospice versus residential hospice with family in case she does stabilize enough where consideration would need to be for potential discharge.  I did clearly review with him, however, that I do not think that her prognosis is changed drastically to the point where I would anticipate her living more than days to less than 2 weeks.  He expressed understanding and again expressed thanks for the care that she is receiving.  Length of Stay: 7  Current  Medications: Scheduled Meds:   famotidine  20 mg Oral Daily   metroNIDAZOLE   Topical BID   mirtazapine  15 mg Oral QHS   nystatin   Topical TID   polyethylene glycol  17 g Oral Daily   senna-docusate  1 tablet Oral BID   silver sulfADIAZINE   Topical Daily    Continuous Infusions:    PRN Meds: acetaminophen **OR** acetaminophen, diphenhydrAMINE-zinc acetate, HYDROmorphone (DILAUDID) injection, liver oil-zinc oxide, ondansetron **OR** ondansetron (ZOFRAN) IV, oxyCODONE  Physical Exam  Awake, appears comfortable. Appears frail Regular work of breathing Abdominal images viewed on chart Interactive No distress  Vital Signs: BP (!) 115/57 (BP Location: Left Arm)   Pulse 94   Temp 98.6 F (37 C) (Axillary)   Resp 18   Ht 5\' 4"  (1.626 m)   SpO2 93%   BMI 17.16 kg/m  SpO2: SpO2: 93 % O2 Device: O2 Device: Nasal Cannula O2 Flow Rate: O2 Flow Rate (L/min): 2 L/min  Intake/output summary: No intake or output data in the 24 hours ending 04/14/21 1757  LBM: Last BM Date: 04/13/21 Baseline Weight:   Most recent weight:         Palliative Assessment/Data:      Patient Active Problem List   Diagnosis Date Noted   Nonhealing surgical wound, initial encounter    Palliative care by specialist    Goals of care, counseling/discussion    General weakness    Chronic dermatitis    AMS (altered mental status) 04/07/2021   Chronic osteomyelitis of sacrum ([place]) 04/07/2021   Fecal impaction ([place]) 04/07/2021   Leukocytosis 04/07/2021   Chronic wound infection of abdomen 04/07/2021   Debility 11/04/2020   Dysphagia    Oral thrush    Encounter for dental examination    Facial swelling    Atrophy of edentulous alveolar ridge    Abdominal wall cellulitis 10/24/2020   Severe sepsis (HCC) 10/23/2020   Abdominal pain 10/20/2020   Diarrhea 10/02/2020   Pressure injury of skin 09/06/2020   Acute on chronic respiratory failure with hypoxemia (HCC) 08/23/2020    Protein-calorie malnutrition, severe 08/20/2020   Fall 01/25/2020   Other fatigue 01/03/2020   Left shoulder pain 01/03/2020   Urinary frequency 01/03/2020   Acute otalgia, left 09/06/2019   Unintentional weight loss 07/18/2019   Personal history of PE (pulmonary embolism) 04/23/2019   Headache 02/11/2019   Iron deficiency anemia 12/21/2018   Cold sore 10/23/2018   Blood in stool 09/14/2018   Guttate psoriasis 07/19/2018   Therapeutic drug monitoring 07/17/2018   Chronic diastolic CHF (congestive heart failure) ([place]) 12/19/2017   Rash 08/11/2017   Chronic respiratory failure with hypoxia (HCC) 03/30/2017   Angular cheilitis 03/08/2017   Leg pain 10/25/2016   Back pain 08/05/2016   RA (rheumatoid arthritis) ([place]) 03/31/2015   Allergic rhinitis    Varicose vein 09/11/2014   ILD (interstitial lung disease) ([place]) 06/25/2012   Chest pain 02/27/2012   CAD (coronary artery disease) 02/27/2012   Pruritus 08/18/2009   Postinflammatory pulmonary fibrosis / RA ILD  06/30/2008   Constipation 01/03/2008   Dyslipidemia 06/16/2007    Palliative Care Assessment & Plan   Patient Profile:  87 year old lady known to palliative medicine service from previous hospitalization, see surgery in outpatient setting.  Small bowel obstruction in February for which he underwent ex lap with detorsion of her small bowel alongside placement of G-tube.  Prolonged postoperative course after that and was discharged to SNF.  Several recurrent admissions for weakness failure to thrive as well as leakage around her G-tube.  G-tube replaced by interventional radiology in July for concern for clogging.  Patient removed G-tube in August and has had a significant excoriation and wound secondary to leakage.  Patient admitted to hospital medicine service for abdominal wound and failure to thrive. Decision made not to replace G tube and goals moving forward is focus on comfort.  Assessment:  Leakage at prior gastrostomy  site Generalized weakness Generalized pain  Recommendations/Plan: Continue comfort measures.  Appears  comfortable at time of my examination. She continues to decline overall but has had intermittent periods of lethargy and periods where she is a little more awake. Prognosis remains days to less than 2 weeks.  Anticipate hospital death versus discharge to residential hospice or home with hospice services  Goals of Care and Additional Recommendations: Limitations on Scope of Treatment: Comfort measures  Code Status: Now DNR and comfort measures.  Prognosis: Likely days  Discharge Planning: Anticipated hospital death vs residential hospice.  Thank you for allowing the Palliative Medicine Team to assist in the care of this patient.   Time In: 1230 Time Out: 1315 Total Time 45 Prolonged Time Billed  no   Greater than 50%  of this time was spent counseling and coordinating care related to the above assessment and plan.   Micheline Rough, MD  Please contact Palliative Medicine Team phone at [phone] for questions and concerns.

## 2021-04-14 NOTE — Progress Notes (Signed)
PROGRESS NOTE  [name] [name]:[id number] DOB: May 02, 1931 DOA: 03/14/2021 PCP: Hoyt Koch, MD  HPI/Recap of past 24 hours: Patient is an 87 year old female past medical history of diastolic heart failure, a chronic nonhealing abdominal wound history of DVT/PE and CAD who presented to the emergency room from her skilled nursing facility on 9/27 with confusion.  During course of work-up patient found to have sacral decubitus ulcers with secondary osteomyelitis.  Given severity and chronic nature of wounds and after discussion with infectious disease and general surgery and given her poor wound status, patient was made comfort care.  Palliative care has since been involved.  Since then, patient has overall decline but periods of stability.  In discussion with family, they declined residential hospice and if patient does not pass in the hospital, are considering taking her home.  Assessment/Plan: Principal Problem:   AMS (altered mental status): Likely multifactorial from infection plus dehydration and sedation.  Patient now on comfort care. Active Problems:   Chronic diastolic CHF (congestive heart failure) ([place]): Euvolemic.    Chronic osteomyelitis of sacrum (HCC)/chronic wound infection of abdomen/leukocytosis plan is for now comfort care.  No treatment with antibiotics  Code Status: DNR  Family Communication: Left message for family  Disposition Plan: Patient either expected to pass in the hospital or home with family with hospice   Consultants: Palliative care General surgery Infectious disease Wound care Interventional radiology  Procedures: None  Antimicrobials: Metronidazole gel  DVT prophylaxis: SCDs, now discontinued  Level of care: Med-Surg   Objective: Vitals:   04/13/21 1310 04/13/21 1935  BP: 97/71 (!) 115/57  Pulse:  94  Resp:  18  Temp: 98.1 F (36.7 C) 98.6 F (37 C)  SpO2:  93%   No intake or output data in the 24 hours ending 04/14/21  1350 There were no vitals filed for this visit. Body mass index is 17.16 kg/m.  Exam:  General: Resting comfortably, no acute distress Cardiovascular: Regular rate and rhythm, S1-S2 Respiratory: Clear to auscultation bilaterally   Data Reviewed: CBC: Recent Labs  Lab 04/08/21 0436 04/09/21 0828 04/10/21 0553  WBC 16.3* 16.8* 18.1*  NEUTROABS  --   --  13.6*  HGB 9.5* 9.5* 10.0*  HCT 30.1* 30.9* 31.4*  MCV 94.4 94.8 94.0  PLT 244 239 672   Basic Metabolic Panel: Recent Labs  Lab 04/08/21 0436 04/09/21 0828 04/10/21 0553  NA 140 144 143  K 3.2* 3.7 3.4*  CL 105 108 106  CO2 29 31 31   GLUCOSE 66* 111* 79  BUN 11 7* 8  CREATININE 0.48 0.49 0.46  CALCIUM 8.2* 8.5* 8.5*  MG 1.6* 1.8 1.6*   GFR: CrCl cannot be calculated (Unknown ideal weight.). Liver Function Tests: Recent Labs  Lab 04/09/21 0828  AST 22  ALT 9  ALKPHOS 101  BILITOT 0.6  PROT 5.6*  ALBUMIN 1.9*   No results for input(s): LIPASE, AMYLASE in the last 168 hours. No results for input(s): AMMONIA in the last 168 hours. Coagulation Profile: No results for input(s): INR, PROTIME in the last 168 hours. Cardiac Enzymes: No results for input(s): CKTOTAL, CKMB, CKMBINDEX, TROPONINI in the last 168 hours. BNP (last 3 results) Recent Labs    06/03/20 1001 09/29/20 1425  PROBNP 100 58.0   HbA1C: No results for input(s): HGBA1C in the last 72 hours. CBG: Recent Labs  Lab 04/08/21 0908 04/08/21 1124  GLUCAP 51* 126*   Lipid Profile: No results for input(s): CHOL, HDL, LDLCALC, TRIG,  CHOLHDL, LDLDIRECT in the last 72 hours. Thyroid Function Tests: No results for input(s): TSH, T4TOTAL, FREET4, T3FREE, THYROIDAB in the last 72 hours. Anemia Panel: No results for input(s): VITAMINB12, FOLATE, FERRITIN, TIBC, IRON, RETICCTPCT in the last 72 hours. Urine analysis:    Component Value Date/Time   COLORURINE YELLOW 03/18/2021 2216   APPEARANCEUR CLEAR 03/28/2021 2216   LABSPEC 1.010  04/02/2021 2216   PHURINE 7.0 04/01/2021 2216   GLUCOSEU NEGATIVE 03/22/2021 2216   GLUCOSEU NEGATIVE 01/03/2020 1434   HGBUR TRACE (A) 03/17/2021 2216   [place] 03/19/2021 2216   BILIRUBINUR N 01/24/2014 1354   KETONESUR NEGATIVE 03/15/2021 2216   PROTEINUR TRACE (A) 03/12/2021 2216   UROBILINOGEN 4.0 (A) 01/03/2020 1434   NITRITE NEGATIVE 03/27/2021 2216   LEUKOCYTESUR NEGATIVE 04/01/2021 2216   Sepsis Labs: @LABRCNTIP (procalcitonin:4,lacticidven:4)  ) Recent Results (from the past 240 hour(s))  Culture, blood (Routine x 2)     Status: None   Collection Time: 03/30/2021 10:16 PM   Specimen: BLOOD LEFT FOREARM  Result Value Ref Range Status   Specimen Description   Final    BLOOD LEFT FOREARM Performed at [facility], [place] [address], [place], [place] [address]    Special Requests   Final    BOTTLES DRAWN AEROBIC AND ANAEROBIC Blood Culture results may not be optimal due to an excessive volume of blood received in culture bottles Performed at [place] [address], [place], [place] [address]    Culture   Final    NO GROWTH 5 DAYS Performed at [facility], [place] [address], [place], [place] [address]    Report Status 04/12/2021 FINAL  Final  Resp Panel by RT-PCR (Flu A&B, Covid) Nasopharyngeal Swab     Status: None   Collection Time: 03/11/2021 10:41 PM   Specimen: Nasopharyngeal Swab; Nasopharyngeal(NP) swabs in vial transport medium  Result Value Ref Range Status   SARS Coronavirus 2 by RT PCR NEGATIVE NEGATIVE Final    Comment: (NOTE) SARS-CoV-2 target nucleic acids are NOT DETECTED.  The SARS-CoV-2 RNA is generally detectable in upper respiratory specimens during the acute phase of infection. The lowest concentration of SARS-CoV-2 viral copies this assay can detect is 138 copies/mL. A negative result does not preclude SARS-Cov-2 infection and should not be used as the sole basis for treatment or other  patient management decisions. A negative result may occur with  improper specimen collection/handling, submission of specimen other than nasopharyngeal swab, presence of viral mutation(s) within the areas targeted by this assay, and inadequate number of viral copies(<138 copies/mL). A negative result must be combined with clinical observations, patient history, and epidemiological information. The expected result is Negative.  Fact Sheet for Patients:  EntrepreneurPulse.com.au  Fact Sheet for Healthcare Providers:  IncredibleEmployment.be  This test is no t yet approved or cleared by the [place] FDA and  has been authorized for detection and/or diagnosis of SARS-CoV-2 by FDA under an Emergency Use Authorization (EUA). This EUA will remain  in effect (meaning this test can be used) for the duration of the COVID-19 declaration under Section 564(b)(1) of the Act, 21 U.S.C.section 360bbb-3(b)(1), unless the authorization is terminated  or revoked sooner.       Influenza A by PCR NEGATIVE NEGATIVE Final   Influenza B by PCR NEGATIVE NEGATIVE Final    Comment: (NOTE) The Xpert Xpress SARS-CoV-2/FLU/RSV plus assay is intended as an aid in the diagnosis of influenza from Nasopharyngeal swab specimens and should  not be used as a sole basis for treatment. Nasal washings and aspirates are unacceptable for Xpert Xpress SARS-CoV-2/FLU/RSV testing.  Fact Sheet for Patients: EntrepreneurPulse.com.au  Fact Sheet for Healthcare Providers: IncredibleEmployment.be  This test is not yet approved or cleared by the [place] FDA and has been authorized for detection and/or diagnosis of SARS-CoV-2 by FDA under an Emergency Use Authorization (EUA). This EUA will remain in effect (meaning this test can be used) for the duration of the COVID-19 declaration under Section 564(b)(1) of the Act, 21 U.S.C. section  360bbb-3(b)(1), unless the authorization is terminated or revoked.  Performed at [facility], [place] [address], [place], [place] [address]   Aerobic/Anaerobic Culture w Gram Stain (surgical/deep wound)     Status: None   Collection Time: 04/01/2021 11:23 PM   Specimen: Abdomen; Wound  Result Value Ref Range Status   Specimen Description   Final    ABDOMEN Performed at [place] [address], [place], [place] [address]    Special Requests   Final    NONE Performed at [facility], [place] [address] [place], [place] [address]    Gram Stain   Final    FEW SQUAMOUS EPITHELIAL CELLS PRESENT FEW WBC SEEN MODERATE GRAM POSITIVE COCCI    Culture   Final    MODERATE METHICILLIN RESISTANT STAPHYLOCOCCUS AUREUS FEW CANDIDA ALBICANS NO ANAEROBES ISOLATED Performed at [facility], [place] [address], [place], [place] [address]    Report Status 04/12/2021 FINAL  Final   Organism ID, Bacteria METHICILLIN RESISTANT STAPHYLOCOCCUS AUREUS  Final      Susceptibility   Methicillin resistant staphylococcus aureus - MIC*    CIPROFLOXACIN >=8 RESISTANT Resistant     ERYTHROMYCIN >=8 RESISTANT Resistant     GENTAMICIN <=0.5 SENSITIVE Sensitive     OXACILLIN >=4 RESISTANT Resistant     TETRACYCLINE <=1 SENSITIVE Sensitive     VANCOMYCIN <=0.5 SENSITIVE Sensitive     TRIMETH/SULFA <=10 SENSITIVE Sensitive     CLINDAMYCIN <=0.25 SENSITIVE Sensitive     RIFAMPIN <=0.5 SENSITIVE Sensitive     Inducible Clindamycin NEGATIVE Sensitive     * MODERATE METHICILLIN RESISTANT STAPHYLOCOCCUS AUREUS  Aerobic Culture w Gram Stain (superficial specimen)     Status: None   Collection Time: 03/13/2021 11:23 PM   Specimen: Abdomen; Wound  Result Value Ref Range Status   Specimen Description   Final    ABDOMEN Performed at [place] [address] [place], [place] [address]    Special Requests   Final    NONE Performed at  [facility] [place] [address] [place], [place] [address]    Gram Stain   Final    NO SQUAMOUS EPITHELIAL CELLS SEEN FEW WBC SEEN NO ORGANISMS SEEN Performed at [facility], [place] [address] [place], [place] [address]    Culture   Final    FEW METHICILLIN RESISTANT STAPHYLOCOCCUS AUREUS FEW ENTEROCOCCUS FAECALIS    Report Status 04/10/2021 FINAL  Final   Organism ID, Bacteria METHICILLIN RESISTANT STAPHYLOCOCCUS AUREUS  Final   Organism ID, Bacteria ENTEROCOCCUS FAECALIS  Final      Susceptibility   Enterococcus faecalis - MIC*    AMPICILLIN <=2 SENSITIVE Sensitive     VANCOMYCIN 1 SENSITIVE Sensitive     GENTAMICIN SYNERGY SENSITIVE Sensitive     * FEW ENTEROCOCCUS FAECALIS   Methicillin resistant staphylococcus aureus - MIC*    CIPROFLOXACIN >=8 RESISTANT Resistant  ERYTHROMYCIN >=8 RESISTANT Resistant     GENTAMICIN <=0.5 SENSITIVE Sensitive     OXACILLIN >=4 RESISTANT Resistant     TETRACYCLINE <=1 SENSITIVE Sensitive     VANCOMYCIN <=0.5 SENSITIVE Sensitive     TRIMETH/SULFA <=10 SENSITIVE Sensitive     CLINDAMYCIN <=0.25 SENSITIVE Sensitive     RIFAMPIN <=0.5 SENSITIVE Sensitive     Inducible Clindamycin NEGATIVE Sensitive     * FEW METHICILLIN RESISTANT STAPHYLOCOCCUS AUREUS      Studies: No results found.  Scheduled Meds:  famotidine  20 mg Oral Daily   metroNIDAZOLE   Topical BID   mirtazapine  15 mg Oral QHS   nystatin   Topical TID   polyethylene glycol  17 g Oral Daily   senna-docusate  1 tablet Oral BID   silver sulfADIAZINE   Topical Daily    Continuous Infusions:   LOS: 7 days     Annita Brod, MD [facility]   04/14/2021, 1:50 PM

## 2021-04-15 DIAGNOSIS — Z515 Encounter for palliative care: Secondary | ICD-10-CM | POA: Diagnosis not present

## 2021-04-15 DIAGNOSIS — R400 Somnolence: Secondary | ICD-10-CM | POA: Diagnosis not present

## 2021-04-15 DIAGNOSIS — I5032 Chronic diastolic (congestive) heart failure: Secondary | ICD-10-CM | POA: Diagnosis not present

## 2021-04-15 NOTE — Progress Notes (Signed)
PROGRESS NOTE    [name] [name]  HOZ:[id number]  DOB: 1930/08/09  DOA: 03/24/2021 PCP: Hoyt Koch, MD Outpatient Specialists:   Hospital course:  87 year old female with multiple medical problems was admitted with confusion.  Work-up revealed sacral decubitus with secondary osteomyelitis.  Palliative care was consulted and she is presently on comfort care.   Subjective:  When asked how she was doing, patient initially looked around and then I asked if she was okay she said "I am okay"  and closed her eyes.  Patient shook her head no when I asked if she was in pain.   Objective: Vitals:   04/13/21 1310 04/13/21 1935 04/14/21 1425 04/14/21 2103  BP: 97/71 (!) 115/57 105/70 126/81  Pulse:  94 100 89  Resp:  18 16 18   Temp: 98.1 F (36.7 C) 98.6 F (37 C) 98.4 F (36.9 C) 97.9 F (36.6 C)  TempSrc: Oral Axillary Oral Axillary  SpO2:  93% 95% 94%  Height:       No intake or output data in the 24 hours ending 04/15/21 1513 There were no vitals filed for this visit.   Exam:  General: Weak appearing patient who was sleeping was arousable by voice alone but was very slow to speak. Eyes: sclera anicteric, conjuctiva mild injection bilaterally CVS: S1-S2, regular  Respiratory:  decreased air entry bilaterally secondary to decreased inspiratory effort, rales at bases  GI: NABS, soft, NT  LE: No edema.    Assessment & Plan:   87 year old female with osteomyelitis secondary to sacral decubitus as well as nonhealing abdominal wound presently on comfort care  Comfort care measures Patient appears comfortable, denies any pain Appreciate ongoing palliative care involvement.  Acute metabolic encephalopathy Appears to have resolved, patient was coherent although very tired and weak.  Nonhealing abdominal wound, sacral decubitus ulcer Conservative management  Nutritional status poor Comfort care, no plans for PEG placement   DVT prophylaxis: N/A Code  Status: DNR Family Communication: Palliative care has been communicating with family Disposition Plan:   Patient is from: SNF  Anticipated Discharge Location: TBD  Barriers to Discharge: Family is not yet comfortable with discharge  Is patient medically stable for Discharge: Yes   Consultants: Palliative care Antimicrobials: None   Data Reviewed:  Basic Metabolic Panel: Recent Labs  Lab 04/09/21 0828 04/10/21 0553  NA 144 143  K 3.7 3.4*  CL 108 106  CO2 31 31  GLUCOSE 111* 79  BUN 7* 8  CREATININE 0.49 0.46  CALCIUM 8.5* 8.5*  MG 1.8 1.6*   Liver Function Tests: Recent Labs  Lab 04/09/21 0828  AST 22  ALT 9  ALKPHOS 101  BILITOT 0.6  PROT 5.6*  ALBUMIN 1.9*   No results for input(s): LIPASE, AMYLASE in the last 168 hours. No results for input(s): AMMONIA in the last 168 hours. CBC: Recent Labs  Lab 04/09/21 0828 04/10/21 0553  WBC 16.8* 18.1*  NEUTROABS  --  13.6*  HGB 9.5* 10.0*  HCT 30.9* 31.4*  MCV 94.8 94.0  PLT 239 268   Cardiac Enzymes: No results for input(s): CKTOTAL, CKMB, CKMBINDEX, TROPONINI in the last 168 hours. BNP (last 3 results) Recent Labs    06/03/20 1001 09/29/20 1425  PROBNP 100 58.0   CBG: No results for input(s): GLUCAP in the last 168 hours.  Recent Results (from the past 240 hour(s))  Culture, blood (Routine x 2)     Status: None   Collection Time: 03/19/2021 10:16 PM  Specimen: BLOOD LEFT FOREARM  Result Value Ref Range Status   Specimen Description   Final    BLOOD LEFT FOREARM Performed at [facility] [address] [place], [place] [address]    Special Requests   Final    BOTTLES DRAWN AEROBIC AND ANAEROBIC Blood Culture results may not be optimal due to an excessive volume of blood received in culture bottles Performed at [facility] [address], [place], [place] [address]    Culture   Final    NO GROWTH 5 DAYS Performed at [facility], [place]  [address], [place], [place] [address]    Report Status 04/12/2021 FINAL  Final  Resp Panel by RT-PCR (Flu A&B, Covid) Nasopharyngeal Swab     Status: None   Collection Time: 03/27/2021 10:41 PM   Specimen: Nasopharyngeal Swab; Nasopharyngeal(NP) swabs in vial transport medium  Result Value Ref Range Status   SARS Coronavirus 2 by RT PCR NEGATIVE NEGATIVE Final    Comment: (NOTE) SARS-CoV-2 target nucleic acids are NOT DETECTED.  The SARS-CoV-2 RNA is generally detectable in upper respiratory specimens during the acute phase of infection. The lowest concentration of SARS-CoV-2 viral copies this assay can detect is 138 copies/mL. A negative result does not preclude SARS-Cov-2 infection and should not be used as the sole basis for treatment or other patient management decisions. A negative result may occur with  improper specimen collection/handling, submission of specimen other than nasopharyngeal swab, presence of viral mutation(s) within the areas targeted by this assay, and inadequate number of viral copies(<138 copies/mL). A negative result must be combined with clinical observations, patient history, and epidemiological information. The expected result is Negative.  Fact Sheet for Patients:  EntrepreneurPulse.com.au  Fact Sheet for Healthcare Providers:  IncredibleEmployment.be  This test is no t yet approved or cleared by the [place] FDA and  has been authorized for detection and/or diagnosis of SARS-CoV-2 by FDA under an Emergency Use Authorization (EUA). This EUA will remain  in effect (meaning this test can be used) for the duration of the COVID-19 declaration under Section 564(b)(1) of the Act, 21 U.S.C.section 360bbb-3(b)(1), unless the authorization is terminated  or revoked sooner.       Influenza A by PCR NEGATIVE NEGATIVE Final   Influenza B by PCR NEGATIVE NEGATIVE Final    Comment: (NOTE) The Xpert Xpress SARS-CoV-2/FLU/RSV plus  assay is intended as an aid in the diagnosis of influenza from Nasopharyngeal swab specimens and should not be used as a sole basis for treatment. Nasal washings and aspirates are unacceptable for Xpert Xpress SARS-CoV-2/FLU/RSV testing.  Fact Sheet for Patients: EntrepreneurPulse.com.au  Fact Sheet for Healthcare Providers: IncredibleEmployment.be  This test is not yet approved or cleared by the [place] FDA and has been authorized for detection and/or diagnosis of SARS-CoV-2 by FDA under an Emergency Use Authorization (EUA). This EUA will remain in effect (meaning this test can be used) for the duration of the COVID-19 declaration under Section 564(b)(1) of the Act, 21 U.S.C. section 360bbb-3(b)(1), unless the authorization is terminated or revoked.  Performed at [facility], [place] [address] [place], [place] [address]   Aerobic/Anaerobic Culture w Gram Stain (surgical/deep wound)     Status: None   Collection Time: 03/19/2021 11:23 PM   Specimen: Abdomen; Wound  Result Value Ref Range Status   Specimen Description   Final    ABDOMEN Performed at [facility] [address] [place], [place]  [address]    Special Requests   Final    NONE Performed at [facility], [place] [address], [place], [place] [address]    Gram Stain   Final    FEW SQUAMOUS EPITHELIAL CELLS PRESENT FEW WBC SEEN MODERATE GRAM POSITIVE COCCI    Culture   Final    MODERATE METHICILLIN RESISTANT STAPHYLOCOCCUS AUREUS FEW CANDIDA ALBICANS NO ANAEROBES ISOLATED Performed at [facility], [place] [address] [place] [address] [place], [place] [address]    Report Status 04/12/2021 FINAL  Final   Organism ID, Bacteria METHICILLIN RESISTANT STAPHYLOCOCCUS AUREUS  Final      Susceptibility   Methicillin resistant staphylococcus aureus - MIC*    CIPROFLOXACIN >=8 RESISTANT Resistant     ERYTHROMYCIN >=8 RESISTANT Resistant      GENTAMICIN <=0.5 SENSITIVE Sensitive     OXACILLIN >=4 RESISTANT Resistant     TETRACYCLINE <=1 SENSITIVE Sensitive     VANCOMYCIN <=0.5 SENSITIVE Sensitive     TRIMETH/SULFA <=10 SENSITIVE Sensitive     CLINDAMYCIN <=0.25 SENSITIVE Sensitive     RIFAMPIN <=0.5 SENSITIVE Sensitive     Inducible Clindamycin NEGATIVE Sensitive     * MODERATE METHICILLIN RESISTANT STAPHYLOCOCCUS AUREUS  Aerobic Culture w Gram Stain (superficial specimen)     Status: None   Collection Time: 03/24/2021 11:23 PM   Specimen: Abdomen; Wound  Result Value Ref Range Status   Specimen Description   Final    ABDOMEN Performed at [facility] [address] [place], [place] [address]    Special Requests   Final    NONE Performed at [facility], [place] [address] [place], [place] [address]    Gram Stain   Final    NO SQUAMOUS EPITHELIAL CELLS SEEN FEW WBC SEEN NO ORGANISMS SEEN Performed at [facility], [place] [address] [place], [place] [address]    Culture   Final    FEW METHICILLIN RESISTANT STAPHYLOCOCCUS AUREUS FEW ENTEROCOCCUS FAECALIS    Report Status 04/10/2021 FINAL  Final   Organism ID, Bacteria METHICILLIN RESISTANT STAPHYLOCOCCUS AUREUS  Final   Organism ID, Bacteria ENTEROCOCCUS FAECALIS  Final      Susceptibility   Enterococcus faecalis - MIC*    AMPICILLIN <=2 SENSITIVE Sensitive     VANCOMYCIN 1 SENSITIVE Sensitive     GENTAMICIN SYNERGY SENSITIVE Sensitive     * FEW ENTEROCOCCUS FAECALIS   Methicillin resistant staphylococcus aureus - MIC*    CIPROFLOXACIN >=8 RESISTANT Resistant     ERYTHROMYCIN >=8 RESISTANT Resistant     GENTAMICIN <=0.5 SENSITIVE Sensitive     OXACILLIN >=4 RESISTANT Resistant     TETRACYCLINE <=1 SENSITIVE Sensitive     VANCOMYCIN <=0.5 SENSITIVE Sensitive     TRIMETH/SULFA <=10 SENSITIVE Sensitive     CLINDAMYCIN <=0.25 SENSITIVE Sensitive     RIFAMPIN <=0.5 SENSITIVE Sensitive     Inducible Clindamycin NEGATIVE  Sensitive     * FEW METHICILLIN RESISTANT STAPHYLOCOCCUS AUREUS      Studies: No results found.   Scheduled Meds:  famotidine  20 mg Oral Daily   metroNIDAZOLE   Topical BID   mirtazapine  15 mg Oral QHS   nystatin   Topical TID   polyethylene glycol  17 g Oral Daily   senna-docusate  1 tablet Oral BID   silver sulfADIAZINE   Topical Daily   Continuous Infusions:  Principal Problem:   AMS (altered mental status) Active Problems:   Chronic diastolic CHF (congestive heart  failure) ([place])   Chronic osteomyelitis of sacrum ([place])   Fecal impaction (H[facility])   Leukocytosis   Chronic wound infection of abdomen   Chronic dermatitis   Nonhealing surgical wound, initial encounter   Palliative care by specialist   Goals of care, counseling/discussion   General weakness     Ngozi Alvidrez Derek Jack, [facility]  If 7PM-7AM, please contact night-coverage [email or web address]   LOS: 8 days

## 2021-04-15 NOTE — Progress Notes (Signed)
Daily Progress Note   Patient Name: [name] [name]       Date: 04/15/2021 DOB: 10-04-1930  Age: 87 y.o. MRN#: [id number] Attending Physician: Oren Binet* Primary Care Physician: Hoyt Koch, MD Admit Date: 04/02/2021  Reason for Consultation/Follow-up: Establishing goals of care  Subjective: I saw and examined [name] [name] today.  She was restless during my encounter but had also had bowel movement and needed to be cleaned up.  She does awaken enough to deny pain or shortness of breath, but I am really not sure if she is reliable historian.  Previously to this, she had been resting comfortably over the past couple of days.  I think restlessness is secondary to needing personal care.  I have been talking with family about potential discharge from the hospital based upon her clinical course and potential stabilization.  Staff reports family would like to meet for a family meeting again tomorrow at 1 PM.  Length of Stay: 8  Current Medications: Scheduled Meds:   famotidine  20 mg Oral Daily   metroNIDAZOLE   Topical BID   mirtazapine  15 mg Oral QHS   nystatin   Topical TID   polyethylene glycol  17 g Oral Daily   senna-docusate  1 tablet Oral BID   silver sulfADIAZINE   Topical Daily    Continuous Infusions:    PRN Meds: acetaminophen **OR** acetaminophen, diphenhydrAMINE-zinc acetate, HYDROmorphone (DILAUDID) injection, liver oil-zinc oxide, ondansetron **OR** ondansetron (ZOFRAN) IV, oxyCODONE  Physical Exam         Awake, appears comfortable. Appears frail Regular work of breathing Interactive No distress  Vital Signs: BP 126/81 (BP Location: Right Arm)   Pulse 89   Temp 97.9 F (36.6 C) (Axillary)   Resp 18   Ht 5\' 4"  (1.626 m)   SpO2 94%   BMI  17.16 kg/m  SpO2: SpO2: 94 % O2 Device: O2 Device: Nasal Cannula O2 Flow Rate: O2 Flow Rate (L/min): 2 L/min  Intake/output summary: No intake or output data in the 24 hours ending 04/15/21 1531  LBM: Last BM Date: 04/15/21 Baseline Weight:   Most recent weight:         Palliative Assessment/Data:      Patient Active Problem List   Diagnosis Date Noted   Nonhealing surgical wound, initial encounter  Palliative care by specialist    Goals of care, counseling/discussion    General weakness    Chronic dermatitis    AMS (altered mental status) 04/07/2021   Chronic osteomyelitis of sacrum ([place]) 04/07/2021   Fecal impaction ([place]) 04/07/2021   Leukocytosis 04/07/2021   Chronic wound infection of abdomen 04/07/2021   Debility 11/04/2020   Dysphagia    Oral thrush    Encounter for dental examination    Facial swelling    Atrophy of edentulous alveolar ridge    Abdominal wall cellulitis 10/24/2020   Severe sepsis ([place]) 10/23/2020   Abdominal pain 10/20/2020   Diarrhea 10/02/2020   Pressure injury of skin 09/06/2020   Acute on chronic respiratory failure with hypoxemia ([place]) 08/23/2020   Protein-calorie malnutrition, severe 08/20/2020   Fall 01/25/2020   Other fatigue 01/03/2020   Left shoulder pain 01/03/2020   Urinary frequency 01/03/2020   Acute otalgia, left 09/06/2019   Unintentional weight loss 07/18/2019   Personal history of PE (pulmonary embolism) 04/23/2019   Headache 02/11/2019   Iron deficiency anemia 12/21/2018   Cold sore 10/23/2018   Blood in stool 09/14/2018   Guttate psoriasis 07/19/2018   Therapeutic drug monitoring 07/17/2018   Chronic diastolic CHF (congestive heart failure) ([place]) 12/19/2017   Rash 08/11/2017   Chronic respiratory failure with hypoxia ([place]) 03/30/2017   Angular cheilitis 03/08/2017   Leg pain 10/25/2016   Back pain 08/05/2016   RA (rheumatoid arthritis) ([place]) 03/31/2015   Allergic rhinitis    Varicose vein 09/11/2014   ILD  (interstitial lung disease) ([place]) 06/25/2012   Chest pain 02/27/2012   CAD (coronary artery disease) 02/27/2012   Pruritus 08/18/2009   Postinflammatory pulmonary fibrosis / RA ILD  06/30/2008   Constipation 01/03/2008   Dyslipidemia 06/16/2007    Palliative Care Assessment & Plan   Patient Profile:  87 year old lady known to palliative medicine service from previous hospitalization, see surgery in outpatient setting.  Small bowel obstruction in February for which he underwent ex lap with detorsion of her small bowel alongside placement of G-tube.  Prolonged postoperative course after that and was discharged to SNF.  Several recurrent admissions for weakness failure to thrive as well as leakage around her G-tube.  G-tube replaced by interventional radiology in July for concern for clogging.  Patient removed G-tube in August and has had a significant excoriation and wound secondary to leakage.  Patient admitted to hospital medicine service for abdominal wound and failure to thrive. Decision made not to replace G tube and goals moving forward is focus on comfort.  Assessment:  Leakage at prior gastrostomy site Generalized weakness Generalized pain  Recommendations/Plan: Continue comfort measures.  Appears comfortable at time of my examination. She continues to decline overall but has had intermittent periods of lethargy and periods where she is a little more awake. Prognosis remains days to less than 2 weeks.  Anticipate hospital death versus discharge to residential hospice or home with hospice services.  Plan for meeting to discuss further with family tomorrow at 1 PM.  Goals of Care and Additional Recommendations: Limitations on Scope of Treatment: Comfort measures  Code Status: Now DNR and comfort measures.  Prognosis: Likely days  Discharge Planning: Anticipated hospital death vs residential hospice vs home with hospice.  Thank you for allowing the Palliative Medicine Team to  assist in the care of this patient.   Total Time 20 Prolonged Time Billed  no   Greater than 50%  of this time was spent counseling and  coordinating care related to the above assessment and plan.   Micheline Rough, MD  Please contact Palliative Medicine Team phone at [phone] for questions and concerns.

## 2021-04-15 NOTE — Care Management Important Message (Signed)
Important Message  Patient Details IM Letter placed in the Patients room. Name: [name] [name] MRN: [id number] Date of Birth: April 24, 1931   Medicare Important Message Given:  Yes     Kerin Salen 04/15/2021, 12:56 PM

## 2021-04-16 DIAGNOSIS — M8668 Other chronic osteomyelitis, other site: Secondary | ICD-10-CM | POA: Diagnosis not present

## 2021-04-16 DIAGNOSIS — Z7189 Other specified counseling: Secondary | ICD-10-CM | POA: Diagnosis not present

## 2021-04-16 DIAGNOSIS — S31109D Unspecified open wound of abdominal wall, unspecified quadrant without penetration into peritoneal cavity, subsequent encounter: Secondary | ICD-10-CM | POA: Diagnosis not present

## 2021-04-16 DIAGNOSIS — R401 Stupor: Secondary | ICD-10-CM | POA: Diagnosis not present

## 2021-04-16 DIAGNOSIS — Z515 Encounter for palliative care: Secondary | ICD-10-CM | POA: Diagnosis not present

## 2021-04-16 DIAGNOSIS — L89150 Pressure ulcer of sacral region, unstageable: Secondary | ICD-10-CM | POA: Diagnosis not present

## 2021-04-16 DIAGNOSIS — R531 Weakness: Secondary | ICD-10-CM | POA: Diagnosis not present

## 2021-04-16 DIAGNOSIS — I5032 Chronic diastolic (congestive) heart failure: Secondary | ICD-10-CM | POA: Diagnosis not present

## 2021-04-16 NOTE — Progress Notes (Signed)
Daily Progress Note   Patient Name: [name] [name]       Date: 04/16/2021 DOB: 1930-11-27  Age: 87 y.o. MRN#: [id number] Attending Physician: Cristal Deer, MD Primary Care Physician: Hoyt Koch, MD Admit Date: 03/25/2021  Reason for Consultation/Follow-up: Establishing goals of care  Subjective: I saw and examined [name] [name] today.  She was lying in bed in no distress at time of my encounter.  She denies pain or shortness of breath.  She speaks very little but talks of her sister (who is deceased) visiting her.  I met with patient's family, [name] [name] and [name] [name] (2 of her 3 POA's).  We discussed her care plan and continued goal of comfort moving forward.  We also discussed that she is not taking in nutrition or hydration and prognosis remains very limited at this point.  Discussed potential discharge options from the hospital including residential hospice versus home hospice.  With her care needs, discussed that residential hospice may be only realistic discharge option versus continued possibility of hospital death.  Family had questions regarding current care she is receiving at the hospital (including wound care and requiring IV pain medications) and i how these would be managed at residential hospice facility.  Family reports that patient's third surrogate decision maker [name] [name]) is returning to town this weekend.  Her 3 joint POA's will then discuss and visit [employer] and potentially other residential hospice facilities.  They feel that [name] [name] needs to be part of this decision and be able to tour facility with them before determining plan other than her remaining in the hospital for comfort care.  Length of Stay: 9  Current Medications: Scheduled Meds:   famotidine  20 mg  Oral Daily   metroNIDAZOLE   Topical BID   mirtazapine  15 mg Oral QHS   nystatin   Topical TID   polyethylene glycol  17 g Oral Daily   senna-docusate  1 tablet Oral BID   silver sulfADIAZINE   Topical Daily    Continuous Infusions:    PRN Meds: acetaminophen **OR** acetaminophen, diphenhydrAMINE-zinc acetate, HYDROmorphone (DILAUDID) injection, liver oil-zinc oxide, ondansetron **OR** ondansetron (ZOFRAN) IV, oxyCODONE  Physical Exam         Awake, appears comfortable. Appears frail Regular work of breathing Interactive No distress  Vital Signs: BP (!) 97/59  Pulse 66   Temp 99 F (37.2 C) (Oral)   Resp 20   Ht '5\' 4"'  (1.626 m)   SpO2 96%   BMI 17.16 kg/m  SpO2: SpO2: 96 % O2 Device: O2 Device: Nasal Cannula O2 [name] Rate: O2 [name] Rate (L/min): 2 L/min  Intake/output summary: No intake or output data in the 24 hours ending 04/16/21 1419  LBM: Last BM Date: 04/16/21 Baseline Weight:   Most recent weight:         Palliative Assessment/Data:      Patient Active Problem List   Diagnosis Date Noted   Nonhealing surgical wound, initial encounter    Palliative care by specialist    Goals of care, counseling/discussion    General weakness    Chronic dermatitis    AMS (altered mental status) 04/07/2021   Chronic osteomyelitis of sacrum ([place]) 04/07/2021   Fecal impaction ([place]) 04/07/2021   Leukocytosis 04/07/2021   Chronic wound infection of abdomen 04/07/2021   Debility 11/04/2020   Dysphagia    Oral thrush    Encounter for dental examination    Facial swelling    Atrophy of edentulous alveolar ridge    Abdominal wall cellulitis 10/24/2020   Severe sepsis ([place]) 10/23/2020   Abdominal pain 10/20/2020   Diarrhea 10/02/2020   Pressure injury of skin 09/06/2020   Acute on chronic respiratory failure with hypoxemia ([place]) 08/23/2020   Protein-calorie malnutrition, severe 08/20/2020   Fall 01/25/2020   Other fatigue 01/03/2020   Left shoulder pain  01/03/2020   Urinary frequency 01/03/2020   Acute otalgia, left 09/06/2019   Unintentional weight loss 07/18/2019   Personal history of PE (pulmonary embolism) 04/23/2019   Headache 02/11/2019   Iron deficiency anemia 12/21/2018   Cold sore 10/23/2018   Blood in stool 09/14/2018   Guttate psoriasis 07/19/2018   Therapeutic drug monitoring 07/17/2018   Chronic diastolic CHF (congestive heart failure) ([place]) 12/19/2017   Rash 08/11/2017   Chronic respiratory failure with hypoxia ([place]) 03/30/2017   Angular cheilitis 03/08/2017   Leg pain 10/25/2016   Back pain 08/05/2016   RA (rheumatoid arthritis) ([place]) 03/31/2015   Allergic rhinitis    Varicose vein 09/11/2014   ILD (interstitial lung disease) ([place]) 06/25/2012   Chest pain 02/27/2012   CAD (coronary artery disease) 02/27/2012   Pruritus 08/18/2009   Postinflammatory pulmonary fibrosis / RA ILD  06/30/2008   Constipation 01/03/2008   Dyslipidemia 06/16/2007    Palliative Care Assessment & Plan   Patient Profile:  87 year old lady known to palliative medicine service from previous hospitalization, see surgery in outpatient setting.  Small bowel obstruction in February for which he underwent ex lap with detorsion of her small bowel alongside placement of G-tube.  Prolonged postoperative course after that and was discharged to SNF.  Several recurrent admissions for weakness failure to thrive as well as leakage around her G-tube.  G-tube replaced by interventional radiology in July for concern for clogging.  Patient removed G-tube in August and has had a significant excoriation and wound secondary to leakage.  Patient admitted to hospital medicine service for abdominal wound and failure to thrive. Decision made not to replace G tube and goals moving forward is focus on comfort.  Assessment:  Leakage at prior gastrostomy site Generalized weakness Generalized pain  Recommendations/Plan: Plan moving forward is for full comfort.  She  appears comfortable at time of my examination.  Continue comfort measures.   She continues to decline overall.  She is not taking in any  meaningful nutrition or hydration.. Prognosis remains days to less than 2 weeks.  Anticipate hospital death versus discharge to residential hospice. [name] [name] has 3 joint POA's.  One of them is out of town but returning this weekend.  Family planning to further discuss residential hospice and [place] once patient's third POA returns to town this weekend.  Goals of Care and Additional Recommendations: Limitations on Scope of Treatment: Comfort measures  Code Status: Now DNR and comfort measures.  Prognosis: Likely days  Discharge Planning: Anticipated hospital death vs residential hospice.  Thank you for allowing the Palliative Medicine Team to assist in the care of this patient.   Total Time 40 Prolonged Time Billed  no    Greater than 50%  of this time was spent counseling and coordinating care related to the above assessment and plan.  Micheline Rough, MD  Please contact Palliative Medicine Team phone at [phone] for questions and concerns.

## 2021-04-16 NOTE — Progress Notes (Addendum)
PROGRESS NOTE  [name] [name] DQQ:[id number] DOB: Dec 28, 1930 DOA: 04/02/2021 PCP: Hoyt Koch, MD  HPI/Recap of past 82 hours: 87 year old female with multiple medical problems was admitted due to confusion She has a sacral decubitus ulcer and osteomyelitis Currently on comfort care.  Subjective: April 16, 2021: Patient seen and examined at bedside pleasant.  She is wearing bilateral mittens She is not in any distress she did answer to questions but slowly  Assessment/Plan: Principal Problem:   AMS (altered mental status) Active Problems:   Chronic diastolic CHF (congestive heart failure) (HCC)   Chronic osteomyelitis of sacrum (HCC)   Fecal impaction (HCC)   Leukocytosis   Chronic wound infection of abdomen   Chronic dermatitis   Nonhealing surgical wound, initial encounter   Palliative care by specialist   Goals of care, counseling/discussion   General weakness #1 comfort care measures Family is deciding whether a residential hospice home hospice.  Waiting for third power of attorney to help make that decision  2.  Acute metabolic encephalopathy appears to have resolved she is more coherent #3 nonhealing abdominal wound sacral ulcer Queen continue conservative management  4.  Malnutrition Patient is comfort care no plans for PEG placement Continue to encourage oral intake  5.  Large abdominal wound and sacral decubitus ulcer  Code Status: DNR  Severity of Illness: The appropriate patient status for this patient is INPATIENT. Inpatient status is judged to be reasonable and necessary in order to provide the required intensity of service to ensure the patient's safety. The patient's presenting symptoms, physical exam findings, and initial radiographic and laboratory data in the context of their chronic comorbidities is felt to place them at high risk for further clinical deterioration. Furthermore, it is not anticipated that the patient will be medically stable  for discharge from the hospital within 2 midnights of admission. The following factors support the patient status of inpatient.  Osteomyelitis Comfort care for possible hospice  * I certify that at the point of admission it is my clinical judgment that the patient will require inpatient hospital care spanning beyond 2 midnights from the point of admission due to high intensity of service, high risk for further deterioration and high frequency of surveillance required.*   Family Communication: None at bedside Palliative care has been communicating with family  Disposition Plan:    Status is: Inpatient   Dispo: The patient is from: SNF              Anticipated d/c is to: SNF/hospice              Anticipated d/c date is:               Patient currently not medically stable for discharge  Consultants: Infectious disease Oncology Palliative care  Procedures: None  Antimicrobials: None  DVT prophylaxis: N/A   Objective: Vitals:   04/13/21 1935 04/14/21 1425 04/14/21 2103 04/15/21 2014  BP: (!) 115/57 105/70 126/81 (!) 97/59  Pulse: 94 100 89 66  Resp: 18 16 18 20   Temp: 98.6 F (37 C) 98.4 F (36.9 C) 97.9 F (36.6 C) 99 F (37.2 C)  TempSrc: Axillary Oral Axillary Oral  SpO2: 93% 95% 94% 96%  Height:       No intake or output data in the 24 hours ending 04/16/21 0857 There were no vitals filed for this visit. Body mass index is 17.16 kg/m.  Exam:  General: 87 y.o. year-old female well developed poorly nourished in no  acute distress.  Alert and oriented x1 frail looking poorly nourished Cardiovascular: Regular rate and rhythm with no rubs or gallops.  No thyromegaly or JVD noted.   Respiratory: Clear to auscultation with no wheezes or rales. Good inspiratory effort. Abdomen: Soft nontender nondistended with normal bowel sounds x4 quadrants. Musculoskeletal: No lower extremity edema. 2/4 pulses in all 4 extremities.  She was noted to be tender when the stethoscope  was placed on her chest also did not touch her feet she cannot winced.  She has Tylenol as needed Skin:Large abdominal wound and sacral decubitus ulcer Psychiatry: Mood is appropriate for condition and setting Neurology:    Data Reviewed: CBC: Recent Labs  Lab 04/10/21 0553  WBC 18.1*  NEUTROABS 13.6*  HGB 10.0*  HCT 31.4*  MCV 94.0  PLT 431   Basic Metabolic Panel: Recent Labs  Lab 04/10/21 0553  NA 143  K 3.4*  CL 106  CO2 31  GLUCOSE 79  BUN 8  CREATININE 0.46  CALCIUM 8.5*  MG 1.6*   GFR: CrCl cannot be calculated (Unknown ideal weight.). Liver Function Tests: No results for input(s): AST, ALT, ALKPHOS, BILITOT, PROT, ALBUMIN in the last 168 hours. No results for input(s): LIPASE, AMYLASE in the last 168 hours. No results for input(s): AMMONIA in the last 168 hours. Coagulation Profile: No results for input(s): INR, PROTIME in the last 168 hours. Cardiac Enzymes: No results for input(s): CKTOTAL, CKMB, CKMBINDEX, TROPONINI in the last 168 hours. BNP (last 3 results) Recent Labs    06/03/20 1001 09/29/20 1425  PROBNP 100 58.0   HbA1C: No results for input(s): HGBA1C in the last 72 hours. CBG: No results for input(s): GLUCAP in the last 168 hours. Lipid Profile: No results for input(s): CHOL, HDL, LDLCALC, TRIG, CHOLHDL, LDLDIRECT in the last 72 hours. Thyroid Function Tests: No results for input(s): TSH, T4TOTAL, FREET4, T3FREE, THYROIDAB in the last 72 hours. Anemia Panel: No results for input(s): VITAMINB12, FOLATE, FERRITIN, TIBC, IRON, RETICCTPCT in the last 72 hours. Urine analysis:    Component Value Date/Time   COLORURINE YELLOW 03/31/2021 2216   APPEARANCEUR CLEAR 03/22/2021 2216   LABSPEC 1.010 03/31/2021 2216   PHURINE 7.0 03/20/2021 2216   GLUCOSEU NEGATIVE 04/03/2021 2216   GLUCOSEU NEGATIVE 01/03/2020 1434   HGBUR TRACE (A) 04/01/2021 2216   [place] 04/09/2021 2216   BILIRUBINUR N 01/24/2014 1354   KETONESUR NEGATIVE  03/30/2021 2216   PROTEINUR TRACE (A) 04/09/2021 2216   UROBILINOGEN 4.0 (A) 01/03/2020 1434   NITRITE NEGATIVE 03/19/2021 2216   LEUKOCYTESUR NEGATIVE 03/30/2021 2216   Sepsis Labs: @LABRCNTIP (procalcitonin:4,lacticidven:4)  ) Recent Results (from the past 240 hour(s))  Culture, blood (Routine x 2)     Status: None   Collection Time: 03/24/2021 10:16 PM   Specimen: BLOOD LEFT FOREARM  Result Value Ref Range Status   Specimen Description   Final    BLOOD LEFT FOREARM Performed at [facility], [place] [address] [place], [place] [address]    Special Requests   Final    BOTTLES DRAWN AEROBIC AND ANAEROBIC Blood Culture results may not be optimal due to an excessive volume of blood received in culture bottles Performed at [place] [address] [place], [place] [address]    Culture   Final    NO GROWTH 5 DAYS Performed at [facility], [place] [address], [place],  [address]    Report Status 04/12/2021 FINAL  Final  Resp Panel by  RT-PCR (Flu A&B, Covid) Nasopharyngeal Swab     Status: None   Collection Time: 03/30/2021 10:41 PM   Specimen: Nasopharyngeal Swab; Nasopharyngeal(NP) swabs in vial transport medium  Result Value Ref Range Status   SARS Coronavirus 2 by RT PCR NEGATIVE NEGATIVE Final    Comment: (NOTE) SARS-CoV-2 target nucleic acids are NOT DETECTED.  The SARS-CoV-2 RNA is generally detectable in upper respiratory specimens during the acute phase of infection. The lowest concentration of SARS-CoV-2 viral copies this assay can detect is 138 copies/mL. A negative result does not preclude SARS-Cov-2 infection and should not be used as the sole basis for treatment or other patient management decisions. A negative result may occur with  improper specimen collection/handling, submission of specimen other than nasopharyngeal swab, presence of viral mutation(s) within the areas targeted by this assay, and inadequate number of  viral copies(<138 copies/mL). A negative result must be combined with clinical observations, patient history, and epidemiological information. The expected result is Negative.  Fact Sheet for Patients:  EntrepreneurPulse.com.au  Fact Sheet for Healthcare Providers:  IncredibleEmployment.be  This test is no t yet approved or cleared by the [place] FDA and  has been authorized for detection and/or diagnosis of SARS-CoV-2 by FDA under an Emergency Use Authorization (EUA). This EUA will remain  in effect (meaning this test can be used) for the duration of the COVID-19 declaration under Section 564(b)(1) of the Act, 21 U.S.C.section 360bbb-3(b)(1), unless the authorization is terminated  or revoked sooner.       Influenza A by PCR NEGATIVE NEGATIVE Final   Influenza B by PCR NEGATIVE NEGATIVE Final    Comment: (NOTE) The Xpert Xpress SARS-CoV-2/FLU/RSV plus assay is intended as an aid in the diagnosis of influenza from Nasopharyngeal swab specimens and should not be used as a sole basis for treatment. Nasal washings and aspirates are unacceptable for Xpert Xpress SARS-CoV-2/FLU/RSV testing.  Fact Sheet for Patients: EntrepreneurPulse.com.au  Fact Sheet for Healthcare Providers: IncredibleEmployment.be  This test is not yet approved or cleared by the [place] FDA and has been authorized for detection and/or diagnosis of SARS-CoV-2 by FDA under an Emergency Use Authorization (EUA). This EUA will remain in effect (meaning this test can be used) for the duration of the COVID-19 declaration under Section 564(b)(1) of the Act, 21 U.S.C. section 360bbb-3(b)(1), unless the authorization is terminated or revoked.  Performed at [facility], [place] [address] [place], [place] [address]   Aerobic/Anaerobic Culture w Gram Stain (surgical/deep wound)     Status: None   Collection Time:  04/07/2021 11:23 PM   Specimen: Abdomen; Wound  Result Value Ref Range Status   Specimen Description   Final    ABDOMEN Performed at [place] [address], [place], [place] [address]    Special Requests   Final    NONE Performed at [facility], [place] [address] [place], [place] [address]    Gram Stain   Final    FEW SQUAMOUS EPITHELIAL CELLS PRESENT FEW WBC SEEN MODERATE GRAM POSITIVE COCCI    Culture   Final    MODERATE METHICILLIN RESISTANT STAPHYLOCOCCUS AUREUS FEW CANDIDA ALBICANS NO ANAEROBES ISOLATED Performed at [facility], [place] [address] [place], [place] [address]    Report Status 04/12/2021 FINAL  Final   Organism ID, Bacteria METHICILLIN RESISTANT STAPHYLOCOCCUS AUREUS  Final      Susceptibility   Methicillin resistant staphylococcus aureus - MIC*    CIPROFLOXACIN >=8 RESISTANT  Resistant     ERYTHROMYCIN >=8 RESISTANT Resistant     GENTAMICIN <=0.5 SENSITIVE Sensitive     OXACILLIN >=4 RESISTANT Resistant     TETRACYCLINE <=1 SENSITIVE Sensitive     VANCOMYCIN <=0.5 SENSITIVE Sensitive     TRIMETH/SULFA <=10 SENSITIVE Sensitive     CLINDAMYCIN <=0.25 SENSITIVE Sensitive     RIFAMPIN <=0.5 SENSITIVE Sensitive     Inducible Clindamycin NEGATIVE Sensitive     * MODERATE METHICILLIN RESISTANT STAPHYLOCOCCUS AUREUS  Aerobic Culture w Gram Stain (superficial specimen)     Status: None   Collection Time: 03/16/2021 11:23 PM   Specimen: Abdomen; Wound  Result Value Ref Range Status   Specimen Description   Final    ABDOMEN Performed at [place] [address] [place], [place] [address]    Special Requests   Final    NONE Performed at [facility], [place] [address] [place], [place] [address]    Gram Stain   Final    NO SQUAMOUS EPITHELIAL CELLS SEEN FEW WBC SEEN NO ORGANISMS SEEN Performed at [facility], [place] [address], [place], [place] [address]    Culture    Final    FEW METHICILLIN RESISTANT STAPHYLOCOCCUS AUREUS FEW ENTEROCOCCUS FAECALIS    Report Status 04/10/2021 FINAL  Final   Organism ID, Bacteria METHICILLIN RESISTANT STAPHYLOCOCCUS AUREUS  Final   Organism ID, Bacteria ENTEROCOCCUS FAECALIS  Final      Susceptibility   Enterococcus faecalis - MIC*    AMPICILLIN <=2 SENSITIVE Sensitive     VANCOMYCIN 1 SENSITIVE Sensitive     GENTAMICIN SYNERGY SENSITIVE Sensitive     * FEW ENTEROCOCCUS FAECALIS   Methicillin resistant staphylococcus aureus - MIC*    CIPROFLOXACIN >=8 RESISTANT Resistant     ERYTHROMYCIN >=8 RESISTANT Resistant     GENTAMICIN <=0.5 SENSITIVE Sensitive     OXACILLIN >=4 RESISTANT Resistant     TETRACYCLINE <=1 SENSITIVE Sensitive     VANCOMYCIN <=0.5 SENSITIVE Sensitive     TRIMETH/SULFA <=10 SENSITIVE Sensitive     CLINDAMYCIN <=0.25 SENSITIVE Sensitive     RIFAMPIN <=0.5 SENSITIVE Sensitive     Inducible Clindamycin NEGATIVE Sensitive     * FEW METHICILLIN RESISTANT STAPHYLOCOCCUS AUREUS      Studies: No results found.  Scheduled Meds:  famotidine  20 mg Oral Daily   metroNIDAZOLE   Topical BID   mirtazapine  15 mg Oral QHS   nystatin   Topical TID   polyethylene glycol  17 g Oral Daily   senna-docusate  1 tablet Oral BID   silver sulfADIAZINE   Topical Daily    Continuous Infusions:   LOS: 9 days     Cristal Deer, MD [facility]  To reach me or the doctor on call, go to: [email or web address] Password [email or web address]  04/16/2021, 8:57 AM

## 2021-04-17 DIAGNOSIS — R401 Stupor: Secondary | ICD-10-CM | POA: Diagnosis not present

## 2021-04-17 DIAGNOSIS — S31109D Unspecified open wound of abdominal wall, unspecified quadrant without penetration into peritoneal cavity, subsequent encounter: Secondary | ICD-10-CM | POA: Diagnosis not present

## 2021-04-17 DIAGNOSIS — R531 Weakness: Secondary | ICD-10-CM | POA: Diagnosis not present

## 2021-04-17 DIAGNOSIS — I5032 Chronic diastolic (congestive) heart failure: Secondary | ICD-10-CM | POA: Diagnosis not present

## 2021-04-17 DIAGNOSIS — R400 Somnolence: Secondary | ICD-10-CM | POA: Diagnosis not present

## 2021-04-17 DIAGNOSIS — M8668 Other chronic osteomyelitis, other site: Secondary | ICD-10-CM | POA: Diagnosis not present

## 2021-04-17 DIAGNOSIS — Z515 Encounter for palliative care: Secondary | ICD-10-CM | POA: Diagnosis not present

## 2021-04-17 MED ORDER — HYDROMORPHONE HCL 1 MG/ML IJ SOLN
1.0000 mg | INTRAMUSCULAR | Status: DC | PRN
Start: 2021-04-17 — End: 2021-04-18
  Administered 2021-04-17 – 2021-04-18 (×4): 1 mg via INTRAVENOUS
  Filled 2021-04-17 (×4): qty 1

## 2021-04-17 MED ORDER — HYDROMORPHONE HCL 1 MG/ML IJ SOLN
1.0000 mg | Freq: Once | INTRAMUSCULAR | Status: AC
  Administered 2021-04-17: 1 mg via INTRAVENOUS
  Filled 2021-04-17: qty 1

## 2021-04-17 NOTE — Progress Notes (Signed)
PROGRESS NOTE  [name] [name] YIF:[id number] DOB: May 17, 1931 DOA: 03/20/2021 PCP: Hoyt Koch, MD  HPI/Recap of past 21 hours: 87 year old female with multiple medical problems was admitted due to confusion She has a sacral decubitus ulcer and osteomyelitis Currently on comfort care.  Subjective: April 16, 2021: Patient seen and examined at bedside pleasant.  She is wearing bilateral mittens She is not in any distress she did answer to questions but slowly  April 17, 2021: Patient seen and examined at bedside she is lying in bed moving a lot and stretching. Nurse reported that she does not feel that the 0.5 mg of Dilaudid is helping control her pain.    Assessment/Plan: Principal Problem:   AMS (altered mental status) Active Problems:   Chronic diastolic CHF (congestive heart failure) (HCC)   Chronic osteomyelitis of sacrum (HCC)   Fecal impaction (HCC)   Leukocytosis   Chronic wound infection of abdomen   Chronic dermatitis   Nonhealing surgical wound, initial encounter   Palliative care by specialist   Goals of care, counseling/discussion   General weakness #1 comfort care measures Family is deciding whether a residential hospice home hospice.  Waiting for third power of attorney to help make that decision  2.  Acute metabolic encephalopathy appears to have resolved she is more coherent #3 nonhealing abdominal wound sacral ulcer Queen continue conservative management  4.  Malnutrition Patient is comfort care no plans for PEG placement Continue to encourage oral intake  5.Large abdominal wound and sacral decubitus ulcer with pain particularly during dressing I will increase her Dilaudid to 1 mg every 4 hours  Code Status: DNR  Severity of Illness: The appropriate patient status for this patient is INPATIENT. Inpatient status is judged to be reasonable and necessary in order to provide the required intensity of service to ensure the patient's safety. The  patient's presenting symptoms, physical exam findings, and initial radiographic and laboratory data in the context of their chronic comorbidities is felt to place them at high risk for further clinical deterioration. Furthermore, it is not anticipated that the patient will be medically stable for discharge from the hospital within 2 midnights of admission. The following factors support the patient status of inpatient.  Osteomyelitis Comfort care for possible hospice  * I certify that at the point of admission it is my clinical judgment that the patient will require inpatient hospital care spanning beyond 2 midnights from the point of admission due to high intensity of service, high risk for further deterioration and high frequency of surveillance required.*   Family Communication: None at bedside Palliative care has been communicating with family  Disposition Plan:    Status is: Inpatient   Dispo: The patient is from: SNF              Anticipated d/c is to: SNF/hospice              Anticipated d/c date is:               Patient currently not medically stable for discharge  Consultants: Infectious disease Oncology Palliative care  Procedures: None  Antimicrobials: None  DVT prophylaxis: N/A   Objective: Vitals:   04/14/21 2103 04/15/21 2014 04/16/21 1932 04/17/21 0627  BP: 126/81 (!) 97/59 (!) 85/48 111/76  Pulse: 89 66 (!) 105 (!) 111  Resp: 18 20 18 20   Temp: 97.9 F (36.6 C) 99 F (37.2 C) 98.5 F (36.9 C) 98.8 F (37.1 C)  TempSrc: Axillary Oral  Oral Oral  SpO2: 94% 96% 100% 98%  Height:       No intake or output data in the 24 hours ending 04/17/21 1001 There were no vitals filed for this visit. Body mass index is 17.16 kg/m.  Exam:  General: 87 y.o. year-old female well developed poorly nourished in no acute distress.  Alert and oriented x1 frail looking poorly nourished Cardiovascular: Regular rate and rhythm with no rubs or gallops.  No thyromegaly or  JVD noted.   Respiratory: Clear to auscultation with no wheezes or rales. Good inspiratory effort. Abdomen: Soft nontender nondistended with normal bowel sounds x4 quadrants. Musculoskeletal: No lower extremity edema. 2/4 pulses in all 4 extremities.  She was noted to be tender when the stethoscope was placed on her chest also did not touch her feet she cannot winced.  She has Tylenol as needed Skin:Large abdominal wound and sacral decubitus ulcer Psychiatry: Mood is appropriate for condition and setting Neurology:    Data Reviewed: CBC: No results for input(s): WBC, NEUTROABS, HGB, HCT, MCV, PLT in the last 168 hours.  Basic Metabolic Panel: No results for input(s): NA, K, CL, CO2, GLUCOSE, BUN, CREATININE, CALCIUM, MG, PHOS in the last 168 hours.  GFR: CrCl cannot be calculated (Unknown ideal weight.). Liver Function Tests: No results for input(s): AST, ALT, ALKPHOS, BILITOT, PROT, ALBUMIN in the last 168 hours. No results for input(s): LIPASE, AMYLASE in the last 168 hours. No results for input(s): AMMONIA in the last 168 hours. Coagulation Profile: No results for input(s): INR, PROTIME in the last 168 hours. Cardiac Enzymes: No results for input(s): CKTOTAL, CKMB, CKMBINDEX, TROPONINI in the last 168 hours. BNP (last 3 results) Recent Labs    06/03/20 1001 09/29/20 1425  PROBNP 100 58.0    HbA1C: No results for input(s): HGBA1C in the last 72 hours. CBG: No results for input(s): GLUCAP in the last 168 hours. Lipid Profile: No results for input(s): CHOL, HDL, LDLCALC, TRIG, CHOLHDL, LDLDIRECT in the last 72 hours. Thyroid Function Tests: No results for input(s): TSH, T4TOTAL, FREET4, T3FREE, THYROIDAB in the last 72 hours. Anemia Panel: No results for input(s): VITAMINB12, FOLATE, FERRITIN, TIBC, IRON, RETICCTPCT in the last 72 hours. Urine analysis:    Component Value Date/Time   COLORURINE YELLOW 03/31/2021 2216   APPEARANCEUR CLEAR 04/04/2021 2216   LABSPEC  1.010 03/18/2021 2216   PHURINE 7.0 03/23/2021 2216   GLUCOSEU NEGATIVE 03/24/2021 2216   GLUCOSEU NEGATIVE 01/03/2020 1434   HGBUR TRACE (A) 04/02/2021 2216   [place] 03/31/2021 2216   BILIRUBINUR N 01/24/2014 1354   KETONESUR NEGATIVE 03/16/2021 2216   PROTEINUR TRACE (A) 04/08/2021 2216   UROBILINOGEN 4.0 (A) 01/03/2020 1434   NITRITE NEGATIVE 04/08/2021 2216   LEUKOCYTESUR NEGATIVE 04/07/2021 2216   Sepsis Labs: @LABRCNTIP (procalcitonin:4,lacticidven:4)  ) No results found for this or any previous visit (from the past 240 hour(s)).     Studies: No results found.  Scheduled Meds:  famotidine  20 mg Oral Daily   metroNIDAZOLE   Topical BID   mirtazapine  15 mg Oral QHS   nystatin   Topical TID   polyethylene glycol  17 g Oral Daily   senna-docusate  1 tablet Oral BID   silver sulfADIAZINE   Topical Daily    Continuous Infusions:   LOS: 10 days     Cristal Deer, MD [facility]  To reach me or the doctor on call, go to: [email or web address] Password [email or web address]  04/17/2021, 10:01 AM

## 2021-04-18 DIAGNOSIS — R401 Stupor: Secondary | ICD-10-CM | POA: Diagnosis not present

## 2021-04-18 DIAGNOSIS — S31109D Unspecified open wound of abdominal wall, unspecified quadrant without penetration into peritoneal cavity, subsequent encounter: Secondary | ICD-10-CM | POA: Diagnosis not present

## 2021-04-18 DIAGNOSIS — M8668 Other chronic osteomyelitis, other site: Secondary | ICD-10-CM | POA: Diagnosis not present

## 2021-04-18 DIAGNOSIS — R531 Weakness: Secondary | ICD-10-CM | POA: Diagnosis not present

## 2021-04-18 DIAGNOSIS — T8189XA Other complications of procedures, not elsewhere classified, initial encounter: Secondary | ICD-10-CM | POA: Diagnosis not present

## 2021-04-18 DIAGNOSIS — Z515 Encounter for palliative care: Secondary | ICD-10-CM | POA: Diagnosis not present

## 2021-04-18 MED ORDER — HALOPERIDOL LACTATE 5 MG/ML IJ SOLN: 2.0000 mg | INTRAMUSCULAR | Status: DC | PRN

## 2021-04-18 MED ORDER — HYDROMORPHONE HCL 1 MG/ML IJ SOLN
1.0000 mg | INTRAMUSCULAR | Status: DC | PRN
  Administered 2021-04-18 – 2021-04-20 (×12): 1 mg via INTRAVENOUS
  Filled 2021-04-18 (×12): qty 1

## 2021-04-18 NOTE — Progress Notes (Signed)
Daily Progress Note   Patient Name: [name] [name]       Date: 04/18/2021 DOB: February 10, 1931  Age: 87 y.o. MRN#: [id number] Attending Physician: [name] Deer, MD Primary Care Physician: [name] Koch, MD Admit Date: 04/07/2021  Reason for Consultation/Follow-up: Establishing goals of care  Subjective: I saw and examined [name] [name] today.  She was lying in bed in no distress time my encounter.  Discussed with bedside RN who reports that she was not responding as well to half milligram of Dilaudid and this was increased earlier today after she discussed with [name] [name].  No other concerns at this time per nursing.  Length of Stay: 11  Current Medications: Scheduled Meds:   famotidine  20 mg Oral Daily   metroNIDAZOLE   Topical BID   mirtazapine  15 mg Oral QHS   nystatin   Topical TID   polyethylene glycol  17 g Oral Daily   senna-docusate  1 tablet Oral BID   silver sulfADIAZINE   Topical Daily    Continuous Infusions:    PRN Meds: acetaminophen **OR** acetaminophen, diphenhydrAMINE-zinc acetate, HYDROmorphone (DILAUDID) injection, liver oil-zinc oxide, ondansetron **OR** ondansetron (ZOFRAN) IV, oxyCODONE  Physical Exam         Awake, appears comfortable. Appears frail Regular work of breathing Sleepier today No distress  Vital Signs: BP 109/68 (BP Location: Right Arm)   Pulse (!) 105   Temp 97.8 F (36.6 C) (Oral)   Resp 20   Ht 5\' 4"  (1.626 m)   Wt 43.4 kg   SpO2 98%   BMI 16.42 kg/m  SpO2: SpO2: 98 % O2 Device: O2 Device: Nasal Cannula O2 Flow Rate: O2 Flow Rate (L/min): 2 L/min  Intake/output summary:  Intake/Output Summary (Last 24 hours) at 04/18/2021 1022 Last data filed at 04/18/2021 0400 Gross per 24 hour  Intake 0 ml  Output --  Net 0 ml     LBM: Last BM Date: 04/17/21 Baseline Weight: Weight: 43.4 kg Most recent weight: Weight: 43.4 kg       Palliative Assessment/Data:      Patient Active Problem List   Diagnosis Date Noted   Nonhealing surgical wound, initial encounter    Palliative care by specialist    Goals of care, counseling/discussion    General weakness    Chronic dermatitis  AMS (altered mental status) 04/07/2021   Chronic osteomyelitis of sacrum ([place]) 04/07/2021   Fecal impaction ([place]) 04/07/2021   Leukocytosis 04/07/2021   Chronic wound infection of abdomen 04/07/2021   Debility 11/04/2020   Dysphagia    Oral thrush    Encounter for dental examination    Facial swelling    Atrophy of edentulous alveolar ridge    Abdominal wall cellulitis 10/24/2020   Severe sepsis ([place]) 10/23/2020   Abdominal pain 10/20/2020   Diarrhea 10/02/2020   Pressure injury of skin 09/06/2020   Acute on chronic respiratory failure with hypoxemia (HCC) 08/23/2020   Protein-calorie malnutrition, severe 08/20/2020   Fall 01/25/2020   Other fatigue 01/03/2020   Left shoulder pain 01/03/2020   Urinary frequency 01/03/2020   Acute otalgia, left 09/06/2019   Unintentional weight loss 07/18/2019   Personal history of PE (pulmonary embolism) 04/23/2019   Headache 02/11/2019   Iron deficiency anemia 12/21/2018   Cold sore 10/23/2018   Blood in stool 09/14/2018   Guttate psoriasis 07/19/2018   Therapeutic drug monitoring 07/17/2018   Chronic diastolic CHF (congestive heart failure) ([place]) 12/19/2017   Rash 08/11/2017   Chronic respiratory failure with hypoxia ([place]) 03/30/2017   Angular cheilitis 03/08/2017   Leg pain 10/25/2016   Back pain 08/05/2016   RA (rheumatoid arthritis) ([place]) 03/31/2015   Allergic rhinitis    Varicose vein 09/11/2014   ILD (interstitial lung disease) ([place]) 06/25/2012   Chest pain 02/27/2012   CAD (coronary artery disease) 02/27/2012   Pruritus 08/18/2009   Postinflammatory pulmonary  fibrosis / RA ILD  06/30/2008   Constipation 01/03/2008   Dyslipidemia 06/16/2007    Palliative Care Assessment & Plan   Patient Profile:  87 year old lady known to palliative medicine service from previous hospitalization, see surgery in outpatient setting.  Small bowel obstruction in February for which he underwent ex lap with detorsion of her small bowel alongside placement of G-tube.  Prolonged postoperative course after that and was discharged to SNF.  Several recurrent admissions for weakness failure to thrive as well as leakage around her G-tube.  G-tube replaced by interventional radiology in July for concern for clogging.  Patient removed G-tube in August and has had a significant excoriation and wound secondary to leakage.  Patient admitted to hospital medicine service for abdominal wound and failure to thrive. Decision made not to replace G tube and goals moving forward is focus on comfort.  Assessment:  Leakage at prior gastrostomy site Generalized weakness Generalized pain  Recommendations/Plan: Plan moving forward is for full comfort.  She appears comfortable at time of my examination.  Continue comfort measures She continues to decline overall.  She is not taking in any meaningful nutrition or hydration.. Prognosis remains days to less than 2 weeks.  Anticipate hospital death versus discharge to residential hospice. [name] [name] has 3 joint POA's.  One of them is out of town but returning this weekend.  Family planning to further discuss residential hospice into her [place] this weekend.  She has 3 POA's and one of them was out of town and returning this weekend. Goals of Care and Additional Recommendations: Limitations on Scope of Treatment: Comfort measures  Code Status: DNR and full comfort measures.  Prognosis: Likely days  Discharge Planning: Anticipated hospital death vs residential hospice.  Thank you for allowing the Palliative Medicine Team to assist in the  care of this patient.   Total Time 15 Prolonged Time Billed  no    Greater than 50%  of this time was spent counseling and coordinating care related to the above assessment and plan.   [name] Rough, MD  Please contact Palliative Medicine Team phone at [phone] for questions and concerns.

## 2021-04-18 NOTE — Plan of Care (Signed)
  Problem: Education: Goal: Knowledge of General Education information will improve Description: Including pain rating scale, medication(s)/side effects and non-pharmacologic comfort measures Outcome: Not Applicable   Problem: Nutrition: Goal: Adequate nutrition will be maintained Outcome: Not Progressing   Problem: Pain Managment: Goal: General experience of comfort will improve Outcome: Progressing   Problem: Safety: Goal: Ability to remain free from injury will improve Outcome: Progressing   Problem: Skin Integrity: Goal: Risk for impaired skin integrity will decrease Outcome: Progressing

## 2021-04-18 NOTE — Progress Notes (Addendum)
PROGRESS NOTE  [name] [name] HGD:[id number] DOB: 04/15/1931 DOA: 03/11/2021 PCP: Hoyt Koch, MD  HPI/Recap of past 57 hours: 87 year old female with multiple medical problems was admitted due to confusion She has a sacral decubitus ulcer and osteomyelitis Currently on comfort care.  Subjective: April 16, 2021: Patient seen and examined at bedside pleasant.  She is wearing bilateral mittens She is not in any distress she did answer to questions but slowly  April 17, 2021: Patient seen and examined at bedside she is lying in bed moving a lot and stretching. Nurse reported that she does not feel that the 0.5 mg of Dilaudid is helping control her pain.  April 18, 2021 Patient seen and examined at bedside her nephew [name] is at bedside. Patient is sleeping.  I had increased her Dilaudid to 1 mg for better pain control [name] stated that did third person of interest making the decision for her will be coming in this evening so they have not made a final decision Palliative care is still involved   Assessment/Plan: Principal Problem:   AMS (altered mental status) Active Problems:   Chronic diastolic CHF (congestive heart failure) (HCC)   Chronic osteomyelitis of sacrum (HCC)   Fecal impaction (HCC)   Leukocytosis   Chronic wound infection of abdomen   Chronic dermatitis   Nonhealing surgical wound, initial encounter   Palliative care by specialist   Goals of care, counseling/discussion   General weakness #1 comfort care measures Family is deciding whether a residential hospice home hospice.  Waiting for third power of attorney to help make that decision  2.  Acute metabolic encephalopathy appears to have resolved she is more coherent #3 nonhealing abdominal wound sacral ulcer [place] continue conservative management  4.  Malnutrition Patient is comfort care no plans for PEG placement Continue to encourage oral intake  5.Large abdominal wound and sacral  decubitus ulcer with pain particularly during dressing I will increase her Dilaudid to 1 mg every 4 hours  Code Status: DNR  Severity of Illness: The appropriate patient status for this patient is INPATIENT. Inpatient status is judged to be reasonable and necessary in order to provide the required intensity of service to ensure the patient's safety. The patient's presenting symptoms, physical exam findings, and initial radiographic and laboratory data in the context of their chronic comorbidities is felt to place them at high risk for further clinical deterioration. Furthermore, it is not anticipated that the patient will be medically stable for discharge from the hospital within 2 midnights of admission. The following factors support the patient status of inpatient.  Osteomyelitis Comfort care for possible hospice  * I certify that at the point of admission it is my clinical judgment that the patient will require inpatient hospital care spanning beyond 2 midnights from the point of admission due to high intensity of service, high risk for further deterioration and high frequency of surveillance required.*   Family Communication: [name] is at bedside Palliative care has been communicating with family  Disposition Plan:    Status is: Inpatient   Dispo: The patient is from: SNF              Anticipated d/c is to: SNF/hospice              Anticipated d/c date is:               Patient currently not medically stable for discharge  Consultants: Infectious disease Oncology Palliative care  Procedures: None  Antimicrobials: None  DVT prophylaxis: N/A   Objective: Vitals:   04/15/21 2014 04/16/21 1932 04/17/21 0627 04/17/21 2008  BP: (!) 97/59 (!) 85/48 111/76 109/68  Pulse: 66 (!) 105 (!) 111 (!) 105  Resp: 20 18 20 20   Temp: 99 F (37.2 C) 98.5 F (36.9 C) 98.8 F (37.1 C) 97.8 F (36.6 C)  TempSrc: Oral Oral Oral Oral  SpO2: 96% 100% 98%   Weight:    43.4 kg   Height:    5\' 4"  (1.626 m)    Intake/Output Summary (Last 24 hours) at 04/18/2021 1701 Last data filed at 04/18/2021 0400 Gross per 24 hour  Intake 0 ml  Output --  Net 0 ml   Filed Weights   04/17/21 2008  Weight: 43.4 kg   Body mass index is 16.42 kg/m.  Exam:  General: 87 y.o. year-old female well developed poorly nourished in no acute distress.  Sleeping frail looking poorly nourished Cardiovascular: Regular rate and rhythm with no rubs or gallops.  No thyromegaly or JVD noted.   Respiratory: Clear to auscultation with no wheezes or rales. Good inspiratory effort. Abdomen: Soft nontender nondistended with normal bowel sounds x4 quadrants. Musculoskeletal: No lower extremity edema. 2/4 pulses in all 4 extremities.  She was noted to be tender when the stethoscope was placed on her chest also did not touch her feet she cannot winced.  She has Tylenol as needed Skin:Large abdominal wound and sacral decubitus ulcer Psychiatry: Sleeping Neurology:    Data Reviewed: CBC: No results for input(s): WBC, NEUTROABS, HGB, HCT, MCV, PLT in the last 168 hours.  Basic Metabolic Panel: No results for input(s): NA, K, CL, CO2, GLUCOSE, BUN, CREATININE, CALCIUM, MG, PHOS in the last 168 hours.  GFR: Estimated Creatinine Clearance: 33.3 mL/min (by C-G formula based on SCr of 0.46 mg/dL). Liver Function Tests: No results for input(s): AST, ALT, ALKPHOS, BILITOT, PROT, ALBUMIN in the last 168 hours. No results for input(s): LIPASE, AMYLASE in the last 168 hours. No results for input(s): AMMONIA in the last 168 hours. Coagulation Profile: No results for input(s): INR, PROTIME in the last 168 hours. Cardiac Enzymes: No results for input(s): CKTOTAL, CKMB, CKMBINDEX, TROPONINI in the last 168 hours. BNP (last 3 results) Recent Labs    06/03/20 1001 09/29/20 1425  PROBNP 100 58.0    HbA1C: No results for input(s): HGBA1C in the last 72 hours. CBG: No results for input(s): GLUCAP  in the last 168 hours. Lipid Profile: No results for input(s): CHOL, HDL, LDLCALC, TRIG, CHOLHDL, LDLDIRECT in the last 72 hours. Thyroid Function Tests: No results for input(s): TSH, T4TOTAL, FREET4, T3FREE, THYROIDAB in the last 72 hours. Anemia Panel: No results for input(s): VITAMINB12, FOLATE, FERRITIN, TIBC, IRON, RETICCTPCT in the last 72 hours. Urine analysis:    Component Value Date/Time   COLORURINE YELLOW 04/04/2021 2216   APPEARANCEUR CLEAR 03/31/2021 2216   LABSPEC 1.010 03/16/2021 2216   PHURINE 7.0 03/31/2021 2216   GLUCOSEU NEGATIVE 03/28/2021 2216   GLUCOSEU NEGATIVE 01/03/2020 1434   HGBUR TRACE (A) 03/23/2021 2216   [place] 03/31/2021 2216   BILIRUBINUR N 01/24/2014 1354   KETONESUR NEGATIVE 04/08/2021 2216   PROTEINUR TRACE (A) 03/31/2021 2216   UROBILINOGEN 4.0 (A) 01/03/2020 1434   NITRITE NEGATIVE 03/16/2021 2216   LEUKOCYTESUR NEGATIVE 03/27/2021 2216   Sepsis Labs: @LABRCNTIP (procalcitonin:4,lacticidven:4)  ) No results found for this or any previous visit (from the past 240 hour(s)).     Studies: No results found.  Scheduled Meds:  famotidine  20 mg Oral Daily   metroNIDAZOLE   Topical BID   mirtazapine  15 mg Oral QHS   nystatin   Topical TID   polyethylene glycol  17 g Oral Daily   senna-docusate  1 tablet Oral BID   silver sulfADIAZINE   Topical Daily    Continuous Infusions:   LOS: 11 days     Cristal Deer, MD [facility]  To reach me or the doctor on call, go to: [email or web address] Password [email or web address]  04/18/2021, 5:01 PM

## 2021-04-18 NOTE — Plan of Care (Signed)
  Problem: Nutrition: Goal: Adequate nutrition will be maintained Outcome: Not Progressing   Problem: Pain Managment: Goal: General experience of comfort will improve Outcome: Progressing   Problem: Safety: Goal: Ability to remain free from injury will improve Outcome: Progressing   Problem: Skin Integrity: Goal: Risk for impaired skin integrity will decrease Outcome: Not Progressing

## 2021-04-18 NOTE — Progress Notes (Signed)
Daily Progress Note   Patient Name: [name] [name]       Date: 04/18/2021 DOB: 11/25/1930  Age: 87 y.o. MRN#: [id number] Attending Physician: Cristal Deer, MD Primary Care Physician: Hoyt Koch, MD Admit Date: 04/05/2021  Reason for Consultation/Follow-up: Establishing goals of care  Subjective: Received call from RN that [name] [name] continued to be restless and agitated despite medication for pain.  I saw and examined [name] [name] in conjunction with RN.  She had settled and was resting comfortably at time of my encounter, but it seems that Dilaudid may not be lasting until she is due for her next dose.  Discussed plan to increase the frequency of availability of Dilaudid, add Haldol if needed for any agitation (could also consider Ativan but I believe this is likely terminal agitation and would also be concerned about paradoxical reaction and would try Haldol first), and I modified wound care to be less painful (will still maintain coverings but forego packing).  Length of Stay: 11  Current Medications: Scheduled Meds:   famotidine  20 mg Oral Daily   metroNIDAZOLE   Topical BID   mirtazapine  15 mg Oral QHS   nystatin   Topical TID   polyethylene glycol  17 g Oral Daily   senna-docusate  1 tablet Oral BID   silver sulfADIAZINE   Topical Daily    Continuous Infusions:    PRN Meds: acetaminophen **OR** acetaminophen, diphenhydrAMINE-zinc acetate, haloperidol lactate, HYDROmorphone (DILAUDID) injection, liver oil-zinc oxide, ondansetron **OR** ondansetron (ZOFRAN) IV, oxyCODONE  Physical Exam         Awake, appears comfortable. Appears frail Regular work of breathing Sleep[place] today No distress  Vital Signs: BP 109/68 (BP Location: Right Arm)   Pulse (!) 105    Temp 97.8 F (36.6 C) (Oral)   Resp 20   Ht 5\' 4"  (1.626 m)   Wt 43.4 kg   SpO2 98%   BMI 16.42 kg/m  SpO2: SpO2: 98 % O2 Device: O2 Device: Nasal Cannula O2 Flow Rate: O2 Flow Rate (L/min): 2 L/min  Intake/output summary:  Intake/Output Summary (Last 24 hours) at 04/18/2021 1229 Last data filed at 04/18/2021 0400 Gross per 24 hour  Intake 0 ml  Output --  Net 0 ml    LBM: Last BM Date: 04/17/21 Baseline Weight: Weight: 43.4  kg Most recent weight: Weight: 43.4 kg       Palliative Assessment/Data:      Patient Active Problem List   Diagnosis Date Noted   Nonhealing surgical wound, initial encounter    Palliative care by specialist    Goals of care, counseling/discussion    General weakness    Chronic dermatitis    AMS (altered mental status) 04/07/2021   Chronic osteomyelitis of sacrum ([place]) 04/07/2021   Fecal impaction ([place]) 04/07/2021   Leukocytosis 04/07/2021   Chronic wound infection of abdomen 04/07/2021   Debility 11/04/2020   Dysphagia    Oral thrush    Encounter for dental examination    Facial swelling    Atrophy of edentulous alveolar ridge    Abdominal wall cellulitis 10/24/2020   Severe sepsis ([place]) 10/23/2020   Abdominal pain 10/20/2020   Diarrhea 10/02/2020   Pressure injury of skin 09/06/2020   Acute on chronic respiratory failure with hypoxemia ([place]) 08/23/2020   Protein-calorie malnutrition, severe 08/20/2020   Fall 01/25/2020   Other fatigue 01/03/2020   Left shoulder pain 01/03/2020   Urinary frequency 01/03/2020   Acute otalgia, left 09/06/2019   Unintentional weight loss 07/18/2019   Personal history of PE (pulmonary embolism) 04/23/2019   Headache 02/11/2019   Iron deficiency anemia 12/21/2018   Cold sore 10/23/2018   Blood in stool 09/14/2018   Guttate psoriasis 07/19/2018   Therapeutic drug monitoring 07/17/2018   Chronic diastolic CHF (congestive heart failure) ([place]) 12/19/2017   Rash 08/11/2017   Chronic respiratory  failure with hypoxia ([place]) 03/30/2017   Angular cheilitis 03/08/2017   Leg pain 10/25/2016   Back pain 08/05/2016   RA (rheumatoid arthritis) ([place]) 03/31/2015   Allergic rhinitis    Varicose vein 09/11/2014   ILD (interstitial lung disease) ([place]) 06/25/2012   Chest pain 02/27/2012   CAD (coronary artery disease) 02/27/2012   Pruritus 08/18/2009   Postinflammatory pulmonary fibrosis / RA ILD  06/30/2008   Constipation 01/03/2008   Dyslipidemia 06/16/2007    Palliative Care Assessment & Plan   Patient Profile:  87 year old lady known to palliative medicine service from previous hospitalization, see surgery in outpatient setting.  Small bowel obstruction in February for which he underwent ex lap with detorsion of her small bowel alongside placement of G-tube.  Prolonged postoperative course after that and was discharged to SNF.  Several recurrent admissions for weakness failure to thrive as well as leakage around her G-tube.  G-tube replaced by interventional radiology in July for concern for clogging.  Patient removed G-tube in August and has had a significant excoriation and wound secondary to leakage.  Patient admitted to hospital medicine service for abdominal wound and failure to thrive. Decision made not to replace G tube and goals moving forward is focus on comfort.  Assessment:  Leakage at prior gastrostomy site Generalized weakness Generalized pain  Recommendations/Plan: Plan moving forward is for full comfort.  She appears comfortable at time of my examination.  Continue comfort measures. Pain: Noted to be more restless and it appears increased dose of 1 mg of Dilaudid is effective but it is not lasting for 4 hours.  I increased frequency of available Dilaudid and also slightly modified wound care instructions to be less invasive/painful. I made addition of Haldol as needed for terminal agitation.  Could also consider Ativan, however, I would worry about paradoxical reaction and  would trial Haldol first. She continues to decline overall.  She is not taking in any meaningful nutrition or hydration.[name]  Prognosis remains days to less than 2 weeks.  Anticipate hospital death versus discharge to residential hospice. [name] [name] has 3 joint POA's.  One of them is out of town but returning this weekend.  Family planning to further discuss residential hospice into her [place] this weekend.  She has 3 POA's and one of them was out of town and returning this weekend. Goals of Care and Additional Recommendations: Limitations on Scope of Treatment: Comfort measures  Code Status: DNR and full comfort measures.  Prognosis: Likely days  Discharge Planning: Anticipated hospital death vs residential hospice.  Thank you for allowing the Palliative Medicine Team to assist in the care of this patient.   Total Time 25 Prolonged Time Billed  no    Greater than 50%  of this time was spent counseling and coordinating care related to the above assessment and plan.  Micheline Rough, MD  Please contact Palliative Medicine Team phone at [phone] for questions and concerns.

## 2021-04-19 DIAGNOSIS — T8189XA Other complications of procedures, not elsewhere classified, initial encounter: Secondary | ICD-10-CM | POA: Diagnosis not present

## 2021-04-19 DIAGNOSIS — Z7189 Other specified counseling: Secondary | ICD-10-CM | POA: Diagnosis not present

## 2021-04-19 DIAGNOSIS — M8668 Other chronic osteomyelitis, other site: Secondary | ICD-10-CM | POA: Diagnosis not present

## 2021-04-19 DIAGNOSIS — I5032 Chronic diastolic (congestive) heart failure: Secondary | ICD-10-CM | POA: Diagnosis not present

## 2021-04-19 DIAGNOSIS — R400 Somnolence: Secondary | ICD-10-CM | POA: Diagnosis not present

## 2021-04-19 DIAGNOSIS — Z515 Encounter for palliative care: Secondary | ICD-10-CM | POA: Diagnosis not present

## 2021-04-19 NOTE — Progress Notes (Signed)
Daily Progress Note   Patient Name: [name] [name]       Date: 04/19/2021 DOB: 05/23/1931  Age: 87 y.o. MRN#: [id number] Attending Physician: Cristal Deer, MD Primary Care Physician: Hoyt Koch, MD Admit Date: 03/15/2021  Reason for Consultation/Follow-up: Establishing goals of care  Subjective: I saw and examined Ms. [name] [name] today.  She continues to get weaker each day.  She was not able to converse with me today as she has in the past.  She did appear to be comfortable at time of my exam.  I called and spoke with patient's nephew/HC POA, [name] [name].  [name] [name] reports that family has been waiting for patient's third [place] POA to return to town to discuss potential transition out of the hospital.  Her flight did not arrive until late last night and family has not had the opportunity to discuss care plan or consideration for potential discharge to residential hospice.  I did encourage them again today to consider visit to [facility].  Length of Stay: 12  Current Medications: Scheduled Meds:   famotidine  20 mg Oral Daily   metroNIDAZOLE   Topical BID   mirtazapine  15 mg Oral QHS   nystatin   Topical TID   polyethylene glycol  17 g Oral Daily   senna-docusate  1 tablet Oral BID   silver sulfADIAZINE   Topical Daily    Continuous Infusions:    PRN Meds: acetaminophen **OR** acetaminophen, diphenhydrAMINE-zinc acetate, haloperidol lactate, HYDROmorphone (DILAUDID) injection, liver oil-zinc oxide, ondansetron **OR** ondansetron (ZOFRAN) IV, oxyCODONE  Physical Exam         Awake, appears comfortable. Appears frail Regular work of breathing [place] today No distress  Vital Signs: BP (!) 100/58 (BP Location: Left Arm)   Pulse (!) 109   Temp 97.9 F (36.6 C) (Oral)   Resp 12    Ht 5\' 4"  (1.626 m)   Wt 43.4 kg   SpO2 100%   BMI 16.42 kg/m  SpO2: SpO2: 100 % O2 Device: O2 Device: Nasal Cannula O2 Flow Rate: O2 Flow Rate (L/min): 2 L/min  Intake/output summary:  Intake/Output Summary (Last 24 hours) at 04/19/2021 1908 Last data filed at 04/19/2021 1000 Gross per 24 hour  Intake 10 ml  Output 100 ml  Net -90 ml    LBM: Last BM Date:  04/18/21 Baseline Weight: Weight: 43.4 kg Most recent weight: Weight: 43.4 kg       Palliative Assessment/Data:      Patient Active Problem List   Diagnosis Date Noted   Nonhealing surgical wound, initial encounter    Palliative care by specialist    Goals of care, counseling/discussion    General weakness    Chronic dermatitis    AMS (altered mental status) 04/07/2021   Chronic osteomyelitis of sacrum ([place]) 04/07/2021   Fecal impaction ([place]) 04/07/2021   Leukocytosis 04/07/2021   Chronic wound infection of abdomen 04/07/2021   Debility 11/04/2020   Dysphagia    Oral thrush    Encounter for dental examination    Facial swelling    Atrophy of edentulous alveolar ridge    Abdominal wall cellulitis 10/24/2020   Severe sepsis ([place]) 10/23/2020   Abdominal pain 10/20/2020   Diarrhea 10/02/2020   Pressure injury of skin 09/06/2020   Acute on chronic respiratory failure with hypoxemia ([place]) 08/23/2020   Protein-calorie malnutrition, severe 08/20/2020   Fall 01/25/2020   Other fatigue 01/03/2020   Left shoulder pain 01/03/2020   Urinary frequency 01/03/2020   Acute otalgia, left 09/06/2019   Unintentional weight loss 07/18/2019   Personal history of PE (pulmonary embolism) 04/23/2019   Headache 02/11/2019   Iron deficiency anemia 12/21/2018   Cold sore 10/23/2018   Blood in stool 09/14/2018   Guttate psoriasis 07/19/2018   Therapeutic drug monitoring 07/17/2018   Chronic diastolic CHF (congestive heart failure) ([place]) 12/19/2017   Rash 08/11/2017   Chronic respiratory failure with hypoxia ([place]) 03/30/2017    Angular cheilitis 03/08/2017   Leg pain 10/25/2016   Back pain 08/05/2016   RA (rheumatoid arthritis) ([place]) 03/31/2015   Allergic rhinitis    Varicose vein 09/11/2014   ILD (interstitial lung disease) ([place]) 06/25/2012   Chest pain 02/27/2012   CAD (coronary artery disease) 02/27/2012   Pruritus 08/18/2009   Postinflammatory pulmonary fibrosis / RA ILD  06/30/2008   Constipation 01/03/2008   Dyslipidemia 06/16/2007    Palliative Care Assessment & Plan   Patient Profile:  87 year old lady known to palliative medicine service from previous hospitalization, see surgery in outpatient setting.  Small bowel obstruction in February for which he underwent ex lap with detorsion of her small bowel alongside placement of G-tube.  Prolonged postoperative course after that and was discharged to SNF.  Several recurrent admissions for weakness failure to thrive as well as leakage around her G-tube.  G-tube replaced by interventional radiology in July for concern for clogging.  Patient removed G-tube in August and has had a significant excoriation and wound secondary to leakage.  Patient admitted to hospital medicine service for abdominal wound and failure to thrive. Decision made not to replace G tube and goals moving forward is focus on comfort.  Assessment:  Leakage at prior gastrostomy site Generalized weakness Generalized pain  Recommendations/Plan: Plan moving forward is for full comfort.  She appears comfortable at time of my examination.  Continue comfort measures. Pain: Pain appears improved today with increased frequency of the availability of IV Dilaudid and also modified wound care instructions. I made addition of Haldol as needed for terminal agitation.  Could also consider Ativan, however, I would worry about paradoxical reaction and would trial Haldol first. She continues to decline overall.  She is not taking in any meaningful nutrition or hydration.. Prognosis remains days to less  than 2 weeks.  Anticipate hospital death versus discharge to residential hospice. [name] [name]  has 3 joint POA's.  One of her POA is was out of town and just returned last night.  Family will be meeting to discuss potential for transition to residential hospice.  Goals of Care and Additional Recommendations: Limitations on Scope of Treatment: Comfort measures  Code Status: DNR and full comfort measures.  Prognosis: Likely days  Discharge Planning: Anticipated hospital death vs residential hospice.  Thank you for allowing the Palliative Medicine Team to assist in the care of this patient.   Total Time 30 Prolonged Time Billed  no    Greater than 50%  of this time was spent counseling and coordinating care related to the above assessment and plan.  Micheline Rough, MD  Please contact Palliative Medicine Team phone at [phone] for questions and concerns.

## 2021-04-19 NOTE — Progress Notes (Addendum)
PROGRESS NOTE  [name] [name] IWL:[id number] DOB: 22-Oct-1930 DOA: 03/17/2021 PCP: Hoyt Koch, MD  HPI/Recap of past 72 hours: 87 year old female with multiple medical problems was admitted due to confusion She has a sacral decubitus ulcer and osteomyelitis Currently on comfort care.  Subjective: April 16, 2021: Patient seen and examined at bedside pleasant.  She is wearing bilateral mittens She is not in any distress she did answer to questions but slowly  April 17, 2021: Patient seen and examined at bedside she is lying in bed moving a lot and stretching. Nurse reported that she does not feel that the 0.5 mg of Dilaudid is helping control her pain.  April 18, 2021 Patient seen and examined at bedside her nephew [name] is at bedside. Patient is sleeping.  I had increased her Dilaudid to 1 mg for better pain control [name] stated that did third person of interest making the decision for her will be coming in this evening so they have not made a final decision Palliative care is still involved  April 19, 2021: Patient seen and examined patient is in bed she is wiggling and mumbling to herself. Patient is comfort care only Per TOC notes family member are still trying to make decision whether to discharge her to home or  hospice facility.   Assessment/Plan: Principal Problem:   AMS (altered mental status) Active Problems:   Chronic diastolic CHF (congestive heart failure) (HCC)   Chronic osteomyelitis of sacrum (HCC)   Fecal impaction (HCC)   Leukocytosis   Chronic wound infection of abdomen   Chronic dermatitis   Nonhealing surgical wound, initial encounter   Palliative care by specialist   Goals of care, counseling/discussion   General weakness #1 comfort care measures Family is deciding whether a residential hospice home hospice.  Waiting for third power of attorney to help make that decision  2.  Acute metabolic encephalopathy appears to have resolved  she is more coherent #3 nonhealing abdominal wound sacral ulcer [place] continue conservative management  4.  Malnutrition Patient is comfort care no plans for PEG placement Continue to encourage oral intake  5.Large abdominal wound and sacral decubitus ulcer with pain particularly during dressing I will increase her Dilaudid to 1 mg every 4 hours  Code Status: DNR  Severity of Illness: The appropriate patient status for this patient is INPATIENT. Inpatient status is judged to be reasonable and necessary in order to provide the required intensity of service to ensure the patient's safety. The patient's presenting symptoms, physical exam findings, and initial radiographic and laboratory data in the context of their chronic comorbidities is felt to place them at high risk for further clinical deterioration. Furthermore, it is not anticipated that the patient will be medically stable for discharge from the hospital within 2 midnights of admission. The following factors support the patient status of inpatient.  Osteomyelitis Comfort care for possible hospice  * I certify that at the point of admission it is my clinical judgment that the patient will require inpatient hospital care spanning beyond 2 midnights from the point of admission due to high intensity of service, high risk for further deterioration and high frequency of surveillance required.*   Family Communication: [name] [name] is at bedside Palliative care has been communicating with family  Disposition Plan:    Status is: Inpatient   Dispo: The patient is from: SNF              Anticipated d/c is to: SNF/hospice  Anticipated d/c date is:               Patient currently not medically stable for discharge  Consultants: Infectious disease Oncology Palliative care  Procedures: None  Antimicrobials: None  DVT prophylaxis: N/A   Objective: Vitals:   04/16/21 1932 04/17/21 0627 04/17/21 2008 04/18/21 2015   BP: (!) 85/48 111/76 109/68 (!) 100/58  Pulse: (!) 105 (!) 111 (!) 105 (!) 109  Resp: 18 20 20 12   Temp: 98.5 F (36.9 C) 98.8 F (37.1 C) 97.8 F (36.6 C) 97.9 F (36.6 C)  TempSrc: Oral Oral Oral Oral  SpO2: 100% 98%  100%  Weight:   43.4 kg   Height:   5\' 4"  (1.626 m)     Intake/Output Summary (Last 24 hours) at 04/19/2021 1748 Last data filed at 04/19/2021 1000 Gross per 24 hour  Intake 10 ml  Output 400 ml  Net -390 ml    Filed Weights   04/17/21 2008  Weight: 43.4 kg   Body mass index is 16.42 kg/m.  Exam:  General: 87 y.o. year-old female well developed poorly nourished in no acute distress.  frail looking poorly nourished Cardiovascular: Regular rate and rhythm with no rubs or gallops. Respiratory: Good inspiratory effort.     Data Reviewed: CBC: No results for input(s): WBC, NEUTROABS, HGB, HCT, MCV, PLT in the last 168 hours.  Basic Metabolic Panel: No results for input(s): NA, K, CL, CO2, GLUCOSE, BUN, CREATININE, CALCIUM, MG, PHOS in the last 168 hours.  GFR: Estimated Creatinine Clearance: 33.3 mL/min (by C-G formula based on SCr of 0.46 mg/dL). Liver Function Tests: No results for input(s): AST, ALT, ALKPHOS, BILITOT, PROT, ALBUMIN in the last 168 hours. No results for input(s): LIPASE, AMYLASE in the last 168 hours. No results for input(s): AMMONIA in the last 168 hours. Coagulation Profile: No results for input(s): INR, PROTIME in the last 168 hours. Cardiac Enzymes: No results for input(s): CKTOTAL, CKMB, CKMBINDEX, TROPONINI in the last 168 hours. BNP (last 3 results) Recent Labs    06/03/20 1001 09/29/20 1425  PROBNP 100 58.0    HbA1C: No results for input(s): HGBA1C in the last 72 hours. CBG: No results for input(s): GLUCAP in the last 168 hours. Lipid Profile: No results for input(s): CHOL, HDL, LDLCALC, TRIG, CHOLHDL, LDLDIRECT in the last 72 hours. Thyroid Function Tests: No results for input(s): TSH, T4TOTAL, FREET4,  T3FREE, THYROIDAB in the last 72 hours. Anemia Panel: No results for input(s): VITAMINB12, FOLATE, FERRITIN, TIBC, IRON, RETICCTPCT in the last 72 hours. Urine analysis:    Component Value Date/Time   COLORURINE YELLOW 03/30/2021 2216   APPEARANCEUR CLEAR 04/02/2021 2216   LABSPEC 1.010 03/31/2021 2216   PHURINE 7.0 04/07/2021 2216   GLUCOSEU NEGATIVE 03/28/2021 2216   GLUCOSEU NEGATIVE 01/03/2020 1434   HGBUR TRACE (A) 03/12/2021 2216   [place] 03/13/2021 2216   BILIRUBINUR N 01/24/2014 1354   KETONESUR NEGATIVE 03/26/2021 2216   PROTEINUR TRACE (A) 03/15/2021 2216   UROBILINOGEN 4.0 (A) 01/03/2020 1434   NITRITE NEGATIVE 04/05/2021 2216   LEUKOCYTESUR NEGATIVE 04/02/2021 2216   Sepsis Labs: @LABRCNTIP (procalcitonin:4,lacticidven:4)  ) No results found for this or any previous visit (from the past 240 hour(s)).     Studies: No results found.  Scheduled Meds:  famotidine  20 mg Oral Daily   metroNIDAZOLE   Topical BID   mirtazapine  15 mg Oral QHS   nystatin   Topical TID   polyethylene glycol  17  g Oral Daily   senna-docusate  1 tablet Oral BID   silver sulfADIAZINE   Topical Daily    Continuous Infusions:   LOS: 12 days     Cristal Deer, MD [facility]  To reach me or the doctor on call, go to: [email or web address] Password [email or web address]  04/19/2021, 5:48 PM

## 2021-04-19 NOTE — TOC Initial Note (Signed)
Transition of Care [facility]) - Initial/Assessment Note    Patient Details  Name: [name] [name] MRN: [id number] Date of Birth: 1931/05/30  Transition of Care [facility]) CM/SW Contact:    [name] Mage, LCSW Phone Number: 04/19/2021, 2:21 PM  Clinical Narrative:   Spoke with Ms [name] [name] re plan for patient.  She was unwilling for me to make referral to [facility] until tomorrow when Dr [name] [name] returns.  Will follow up with her tomorrow after Dr [name] [name] has a chance to see Ms [name] [name] and make her recommendation. TOC will continue to follow during the course of hospitalization.                 Expected Discharge Plan: Hospice Medical Facility Barriers to Discharge: Other ([name] enter comment) (Family dynamics)   Patient Goals and CMS Choice        Expected Discharge Plan and Services Expected Discharge Plan: [place]                                              Prior Living Arrangements/Services                       Activities of Daily Living Home Assistive Devices/Equipment: Blood pressure cuff, Eyeglasses, Dentures (specify type), Walker (specify type), Wheelchair, Grab bars around toilet, Grab bars in shower, Hand-held shower hose, Hospital bed, Other (Comment), Scales, Oxygen (upper/lower dentures) ADL Screening (condition at time of admission) Patient's cognitive ability adequate to safely complete daily activities?: No (patient with ams) Is the patient deaf or have difficulty hearing?: No Does the patient have difficulty seeing, even when wearing glasses/contacts?: No Does the patient have difficulty concentrating, remembering, or making decisions?: Yes Patient able to express need for assistance with ADLs?: No Does the patient have difficulty dressing or bathing?: Yes Independently performs ADLs?: No (patient with ams and weakness) Communication: Independent Dressing (OT): Needs assistance Is this a change from baseline?: Pre-admission  baseline Grooming: Needs assistance Is this a change from baseline?: Pre-admission baseline Feeding: Needs assistance Is this a change from baseline?: Pre-admission baseline Bathing: Needs assistance Is this a change from baseline?: Pre-admission baseline Toileting: Needs assistance Is this a change from baseline?: Pre-admission baseline In/Out Bed: Needs assistance Is this a change from baseline?: Pre-admission baseline Walks in Home: Needs assistance Is this a change from baseline?: Pre-admission baseline Does the patient have difficulty walking or climbing stairs?: Yes (secondary to weakness) Weakness of Legs: Both Weakness of Arms/Hands: None  Permission Sought/Granted                  Emotional Assessment              Admission diagnosis:  Chronic dermatitis [L30.9] Delirium [R41.0] Fecal impaction in rectum ([place]) [K56.41] Nonhealing surgical wound, initial encounter [T81.89XA] Pressure injury of sacral region, unstageable ([place]) [L89.150] AMS (altered mental status) [R41.82] Patient Active Problem List   Diagnosis Date Noted   Nonhealing surgical wound, initial encounter    Palliative care by specialist    Goals of care, counseling/discussion    General weakness    Chronic dermatitis    AMS (altered mental status) 04/07/2021   Chronic osteomyelitis of sacrum ([place]) 04/07/2021   Fecal impaction ([place]) 04/07/2021   Leukocytosis 04/07/2021   Chronic wound infection of abdomen 04/07/2021   Debility 11/04/2020  Dysphagia    Oral thrush    Encounter for dental examination    Facial swelling    Atrophy of edentulous alveolar ridge    Abdominal wall cellulitis 10/24/2020   Severe sepsis ([place]) 10/23/2020   Abdominal pain 10/20/2020   Diarrhea 10/02/2020   Pressure injury of skin 09/06/2020   Acute on chronic respiratory failure with hypoxemia ([place]) 08/23/2020   Protein-calorie malnutrition, severe 08/20/2020   Fall 01/25/2020   Other fatigue 01/03/2020    Left shoulder pain 01/03/2020   Urinary frequency 01/03/2020   Acute otalgia, left 09/06/2019   Unintentional weight loss 07/18/2019   Personal history of PE (pulmonary embolism) 04/23/2019   Headache 02/11/2019   Iron deficiency anemia 12/21/2018   Cold sore 10/23/2018   Blood in stool 09/14/2018   Guttate psoriasis 07/19/2018   Therapeutic drug monitoring 07/17/2018   Chronic diastolic CHF (congestive heart failure) ([place]) 12/19/2017   Rash 08/11/2017   Chronic respiratory failure with hypoxia ([place]) 03/30/2017   Angular cheilitis 03/08/2017   Leg pain 10/25/2016   Back pain 08/05/2016   RA (rheumatoid arthritis) ([place]) 03/31/2015   Allergic rhinitis    Varicose vein 09/11/2014   ILD (interstitial lung disease) ([place]) 06/25/2012   Chest pain 02/27/2012   CAD (coronary artery disease) 02/27/2012   Pruritus 08/18/2009   Postinflammatory pulmonary fibrosis / RA ILD  06/30/2008   Constipation 01/03/2008   Dyslipidemia 06/16/2007   PCP:  Hoyt Koch, MD Pharmacy:   [facility] - [address], [place] - [address] [place] [address] Phone: [phone] Fax: [place], [place] [address] Phone: [phone] Fax: [phone]     Social Determinants of Health (SDOH) Interventions    Readmission Risk Interventions Readmission Risk Prevention Plan 04/08/2021 11/04/2020  Transportation Screening Complete Complete  PCP or Specialist Appt within 3-5 Days - Complete  HRI or [place] - Complete  Social Work Consult for [place] Planning/Counseling - Complete  Palliative Care Screening - Complete  Medication Review Press photographer) Complete Complete  PCP or Specialist appointment within 3-5 days of discharge Complete -  [place] or Home Care Consult Complete -  SW Recovery Care/Counseling Consult Complete -  Palliative Care Screening Complete -   Skilled Nursing Facility Complete -  Some recent data might be hidden

## 2021-04-19 NOTE — Care Management Important Message (Signed)
Important Message  Patient Details IM Letter placed in the Patients room. Name: [name] [name] MRN: [id number] Date of Birth: 1930/11/26   Medicare Important Message Given:  Yes     Kerin Salen 04/19/2021, 11:28 AM

## 2021-04-20 DIAGNOSIS — Z7189 Other specified counseling: Secondary | ICD-10-CM | POA: Diagnosis not present

## 2021-04-20 DIAGNOSIS — R400 Somnolence: Secondary | ICD-10-CM | POA: Diagnosis not present

## 2021-04-20 DIAGNOSIS — Z515 Encounter for palliative care: Secondary | ICD-10-CM | POA: Diagnosis not present

## 2021-04-20 DIAGNOSIS — R531 Weakness: Secondary | ICD-10-CM | POA: Diagnosis not present

## 2021-04-20 DIAGNOSIS — I5032 Chronic diastolic (congestive) heart failure: Secondary | ICD-10-CM | POA: Diagnosis not present

## 2021-04-20 MED ORDER — "THROMBI-PAD 3""X3"" EX PADS"
1.0000 | MEDICATED_PAD | Freq: Once | CUTANEOUS | Status: AC
  Administered 2021-04-20: 1 via TOPICAL
  Filled 2021-04-20: qty 1

## 2021-04-20 MED ORDER — HYDROMORPHONE HCL 1 MG/ML IJ SOLN
2.0000 mg | INTRAMUSCULAR | Status: DC | PRN
  Administered 2021-04-20 – 2021-04-21 (×6): 2 mg via INTRAVENOUS
  Filled 2021-04-20 (×6): qty 2

## 2021-04-20 NOTE — Progress Notes (Signed)
Daily Progress Note   Patient Name: [name] [name]       Date: 04/20/2021 DOB: 07-20-1930  Age: 87 y.o. MRN#: [id number] Attending Physician: Cristal Deer, MD Primary Care Physician: Hoyt Koch, MD Admit Date: 03/20/2021  Reason for Consultation/Follow-up: Establishing goals of care  Subjective: I saw and examined Ms. [name] [name] today.  She is resting in bed, appears to be actively dying.  She is not awake or alert.  She has respirations with mandibular movement.  She has coarse gurgling breath sounds/death rattle.  Patient has oliguria/anuria.  She still has a radial pulse.  There is slight hyperextension of the neck and drooping of nasolabial folds.  Call placed and discussed with power of attorney Gwen, end-of-life signs and symptoms discussed.  Length of Stay: 13  Current Medications: Scheduled Meds:   famotidine  20 mg Oral Daily   metroNIDAZOLE   Topical BID   mirtazapine  15 mg Oral QHS   nystatin   Topical TID   polyethylene glycol  17 g Oral Daily   senna-docusate  1 tablet Oral BID   silver sulfADIAZINE   Topical Daily    Continuous Infusions:    PRN Meds: acetaminophen **OR** acetaminophen, diphenhydrAMINE-zinc acetate, haloperidol lactate, HYDROmorphone (DILAUDID) injection, liver oil-zinc oxide, ondansetron **OR** ondansetron (ZOFRAN) IV, oxyCODONE  Physical Exam         Not awake not alert appears comfortable. Appears frail Coarse breath sounds, gurgling Respirations with mandibular movement, drooping of nasolabial fold. No distress  Vital Signs: BP 112/66 (BP Location: Right Arm)   Pulse (!) 118   Temp 98.1 F (36.7 C) (Oral)   Resp 20   Ht 5\' 4"  (1.626 m)   Wt 43.4 kg   SpO2 100%   BMI 16.42 kg/m  SpO2: SpO2: 100 % O2 Device: O2 Device:  Nasal Cannula O2 Flow Rate: O2 Flow Rate (L/min): 2 L/min  Intake/output summary:  Intake/Output Summary (Last 24 hours) at 04/20/2021 1158 Last data filed at 04/20/2021 0358 Gross per 24 hour  Intake 0 ml  Output 200 ml  Net -200 ml    LBM: Last BM Date: 04/19/21 Baseline Weight: Weight: 43.4 kg Most recent weight: Weight: 43.4 kg       Palliative Assessment/Data:      Patient Active Problem List   Diagnosis Date  Noted   Nonhealing surgical wound, initial encounter    Palliative care by specialist    Goals of care, counseling/discussion    General weakness    Chronic dermatitis    AMS (altered mental status) 04/07/2021   Chronic osteomyelitis of sacrum ([place]) 04/07/2021   Fecal impaction ([place]) 04/07/2021   Leukocytosis 04/07/2021   Chronic wound infection of abdomen 04/07/2021   Debility 11/04/2020   Dysphagia    Oral thrush    Encounter for dental examination    Facial swelling    Atrophy of edentulous alveolar ridge    Abdominal wall cellulitis 10/24/2020   Severe sepsis ([place]) 10/23/2020   Abdominal pain 10/20/2020   Diarrhea 10/02/2020   Pressure injury of skin 09/06/2020   Acute on chronic respiratory failure with hypoxemia ([place]) 08/23/2020   Protein-calorie malnutrition, severe 08/20/2020   Fall 01/25/2020   Other fatigue 01/03/2020   Left shoulder pain 01/03/2020   Urinary frequency 01/03/2020   Acute otalgia, left 09/06/2019   Unintentional weight loss 07/18/2019   Personal history of PE (pulmonary embolism) 04/23/2019   Headache 02/11/2019   Iron deficiency anemia 12/21/2018   Cold sore 10/23/2018   Blood in stool 09/14/2018   Guttate psoriasis 07/19/2018   Therapeutic drug monitoring 07/17/2018   Chronic diastolic CHF (congestive heart failure) ([place]) 12/19/2017   Rash 08/11/2017   Chronic respiratory failure with hypoxia ([place]) 03/30/2017   Angular cheilitis 03/08/2017   Leg pain 10/25/2016   Back pain 08/05/2016   RA (rheumatoid arthritis)  ([place]) 03/31/2015   Allergic rhinitis    Varicose vein 09/11/2014   ILD (interstitial lung disease) ([place]) 06/25/2012   Chest pain 02/27/2012   CAD (coronary artery disease) 02/27/2012   Pruritus 08/18/2009   Postinflammatory pulmonary fibrosis / RA ILD  06/30/2008   Constipation 01/03/2008   Dyslipidemia 06/16/2007    Palliative Care Assessment & Plan   Patient Profile:  87 year old lady known to palliative medicine service from previous hospitalization, see surgery in outpatient setting.  Small bowel obstruction in February for which he underwent ex lap with detorsion of her small bowel alongside placement of G-tube.  Prolonged postoperative course after that and was discharged to SNF.  Several recurrent admissions for weakness failure to thrive as well as leakage around her G-tube.  G-tube replaced by interventional radiology in July for concern for clogging.  Patient removed G-tube in August and has had a significant excoriation and wound secondary to leakage.  Patient admitted to hospital medicine service for abdominal wound and failure to thrive. Decision made not to replace G tube and goals moving forward is focus on comfort.  Assessment:  Leakage at prior gastrostomy site Generalized weakness Generalized pain  Recommendations/Plan: Plan moving forward is for full comfort.  She appears comfortable at time of my examination.  Continue comfort measures. Medication history noted, continue current pain and on pain symptom management measures Prognosis Hours to some very limited number of days in my opinion, recommend continuation of comfort measures, anticipate hospital death  Goals of Care and Additional Recommendations: Limitations on Scope of Treatment: Comfort measures  Code Status: DNR and full comfort measures.  Prognosis: Likely hours to some very limited number of days  Discharge Planning: Anticipated hospital death  Thank you for allowing the Palliative Medicine Team  to assist in the care of this patient.   Total Time 35 Prolonged Time Billed  no    Greater than 50%  of this time was spent counseling and coordinating care related  to the above assessment and plan.  Loistine Chance, MD  Please contact Palliative Medicine Team phone at [phone] for questions and concerns.

## 2021-04-20 NOTE — Progress Notes (Addendum)
PROGRESS NOTE  [name] [name] UQJ:[id number] DOB: 13-Sep-1930 DOA: 04/08/2021 PCP: Hoyt Koch, MD  HPI/Recap of past 22 hours: 87 year old female with multiple medical problems was admitted due to confusion She has a sacral decubitus ulcer and osteomyelitis Currently on comfort care.  Subjective: April 16, 2021: Patient seen and examined at bedside pleasant.  She is wearing bilateral mittens She is not in any distress she did answer to questions but slowly  April 17, 2021: Patient seen and examined at bedside she is lying in bed moving a lot and stretching. Nurse reported that she does not feel that the 0.5 mg of Dilaudid is helping control her pain.  April 18, 2021 Patient seen and examined at bedside her nephew [name] is at bedside. Patient is sleeping.  I had increased her Dilaudid to 1 mg for better pain control [name] stated that did third person of interest making the decision for her will be coming in this evening so they have not made a final decision Palliative care is still involved  April 19, 2021: Patient seen and examined patient is in bed she is wiggling and mumbling to herself. Patient is comfort care only Per TOC notes family member are still trying to make decision whether to discharge her to home or  hospice facility.  April 20, 2021: Patient seen and examined at bedside she is sleeping quietly face up mouth open in no distress Nurse reported that patient was bleeding from the PEG site which patient had pulled out her PEG prior to her admission but they are just cleaning .it it was having some greenish discharge yesterday but this morning there is some bleeding Nurse also informed me that family member wants patient to be an organ donor.  I have informed nurse to inform the appropriate entities   Assessment/Plan: Principal Problem:   AMS (altered mental status) Active Problems:   Chronic diastolic CHF (congestive heart failure) (HCC)    Chronic osteomyelitis of sacrum (HCC)   Fecal impaction (HCC)   Leukocytosis   Chronic wound infection of abdomen   Chronic dermatitis   Nonhealing surgical wound, initial encounter   Palliative care by specialist   Goals of care, counseling/discussion   General weakness #1 comfort care measures Family is deciding whether a residential hospice home hospice.  Waiting for third power of attorney to help make that decision  2.  Acute metabolic encephalopathy appears to have resolved she is more coherent #3 nonhealing abdominal wound sacral ulcer [place] continue conservative management  4.  Malnutrition Patient is comfort care no plans for PEG placement Continue to encourage oral intake  5.Large abdominal wound and sacral decubitus ulcer with pain particularly during dressing I will increase her Dilaudid to 1 mg every 4 hours  Code Status: DNR  Severity of Illness: The appropriate patient status for this patient is INPATIENT. Inpatient status is judged to be reasonable and necessary in order to provide the required intensity of service to ensure the patient's safety. The patient's presenting symptoms, physical exam findings, and initial radiographic and laboratory data in the context of their chronic comorbidities is felt to place them at high risk for further clinical deterioration. Furthermore, it is not anticipated that the patient will be medically stable for discharge from the hospital within 2 midnights of admission. The following factors support the patient status of inpatient.  Osteomyelitis Comfort care for possible hospice  * I certify that at the point of admission it is my clinical judgment that the  patient will require inpatient hospital care spanning beyond 2 midnights from the point of admission due to high intensity of service, high risk for further deterioration and high frequency of surveillance required.*   Family Communication: [name] is at bedside Palliative  care has been communicating with family  Disposition Plan:    Status is: Inpatient   Dispo: The patient is from: SNF              Anticipated d/c is to: SNF/hospice              Anticipated d/c date is:               Patient currently not medically stable for discharge  Consultants: Infectious disease Oncology Palliative care  Procedures: None  Antimicrobials: None  DVT prophylaxis: N/A   Objective: Vitals:   04/17/21 2008 04/18/21 2015 04/19/21 1938 04/20/21 0415  BP: 109/68 (!) 100/58 113/72 112/66  Pulse: (!) 105 (!) 109 (!) 119 (!) 118  Resp: 20 12 20    Temp: 97.8 F (36.6 C) 97.9 F (36.6 C) 98.1 F (36.7 C)   TempSrc: Oral Oral Oral   SpO2:  100% 100%   Weight: 43.4 kg     Height: 5\' 4"  (1.626 m)       Intake/Output Summary (Last 24 hours) at 04/20/2021 1333 Last data filed at 04/20/2021 0358 Gross per 24 hour  Intake 0 ml  Output 200 ml  Net -200 ml    Filed Weights   04/17/21 2008  Weight: 43.4 kg   Body mass index is 16.42 kg/m.  Exam:  General: 87 y.o. year-old female well developed poorly nourished in no acute distress.  frail looking poorly nourished sleeping quietly face up mouth open in no distress  Cardiovascular: Regular rate and rhythm with no rubs or gallops. Respiratory: Good inspiratory effort.     Data Reviewed: CBC: No results for input(s): WBC, NEUTROABS, HGB, HCT, MCV, PLT in the last 168 hours.  Basic Metabolic Panel: No results for input(s): NA, K, CL, CO2, GLUCOSE, BUN, CREATININE, CALCIUM, MG, PHOS in the last 168 hours.  GFR: Estimated Creatinine Clearance: 33.3 mL/min (by C-G formula based on SCr of 0.46 mg/dL). Liver Function Tests: No results for input(s): AST, ALT, ALKPHOS, BILITOT, PROT, ALBUMIN in the last 168 hours. No results for input(s): LIPASE, AMYLASE in the last 168 hours. No results for input(s): AMMONIA in the last 168 hours. Coagulation Profile: No results for input(s): INR, PROTIME in the  last 168 hours. Cardiac Enzymes: No results for input(s): CKTOTAL, CKMB, CKMBINDEX, TROPONINI in the last 168 hours. BNP (last 3 results) Recent Labs    06/03/20 1001 09/29/20 1425  PROBNP 100 58.0    HbA1C: No results for input(s): HGBA1C in the last 72 hours. CBG: No results for input(s): GLUCAP in the last 168 hours. Lipid Profile: No results for input(s): CHOL, HDL, LDLCALC, TRIG, CHOLHDL, LDLDIRECT in the last 72 hours. Thyroid Function Tests: No results for input(s): TSH, T4TOTAL, FREET4, T3FREE, THYROIDAB in the last 72 hours. Anemia Panel: No results for input(s): VITAMINB12, FOLATE, FERRITIN, TIBC, IRON, RETICCTPCT in the last 72 hours. Urine analysis:    Component Value Date/Time   COLORURINE YELLOW 03/28/2021 2216   [place] 04/01/2021 2216   LABSPEC 1.010 04/08/2021 2216   PHURINE 7.0 03/31/2021 2216   GLUCOSEU NEGATIVE 03/14/2021 2216   GLUCOSEU NEGATIVE 01/03/2020 1434   [place] (A) 03/21/2021 2216   [place] 03/23/2021 2216   BILIRUBINUR  N 01/24/2014 1354   KETONESUR NEGATIVE 04/04/2021 2216   PROTEINUR TRACE (A) 04/04/2021 2216   UROBILINOGEN 4.0 (A) 01/03/2020 1434   NITRITE NEGATIVE 04/04/2021 2216   LEUKOCYTESUR NEGATIVE 03/12/2021 2216   Sepsis Labs: @LABRCNTIP (procalcitonin:4,lacticidven:4)  ) No results found for this or any previous visit (from the past 240 hour(s)).     Studies: No results found.  Scheduled Meds:  famotidine  20 mg Oral Daily   metroNIDAZOLE   Topical BID   mirtazapine  15 mg Oral QHS   nystatin   Topical TID   polyethylene glycol  17 g Oral Daily   senna-docusate  1 tablet Oral BID   silver sulfADIAZINE   Topical Daily    Continuous Infusions:   LOS: 13 days     Cristal Deer, MD [facility]  To reach me or the doctor on call, go to: [email or web address] Password [email or web address]  04/20/2021, 1:33 PM

## 2021-04-20 NOTE — Progress Notes (Signed)
Pt is bleeding and 3 4x4 gauze were wet. Pt was put thrombi pad on the peg bleeding site and wrapped with ABD pads and taped it. Will continue monitor the pt.

## 2021-04-20 NOTE — Progress Notes (Signed)
Pt is bleeding from peg site wound . Dressing is done. MD notified. Pt is comfort care. Will monitor the bleeding. Will continue monitor the pt.

## 2021-04-21 DIAGNOSIS — Z7189 Other specified counseling: Secondary | ICD-10-CM | POA: Diagnosis not present

## 2021-04-21 DIAGNOSIS — R4182 Altered mental status, unspecified: Secondary | ICD-10-CM | POA: Diagnosis not present

## 2021-04-21 DIAGNOSIS — R531 Weakness: Secondary | ICD-10-CM | POA: Diagnosis not present

## 2021-04-21 MED ORDER — "THROMBI-PAD 3""X3"" EX PADS"
1.0000 | MEDICATED_PAD | CUTANEOUS | Status: DC | PRN
  Filled 2021-04-21: qty 1

## 2021-04-21 MED ORDER — LORAZEPAM 2 MG/ML IJ SOLN
1.0000 mg | INTRAMUSCULAR | Status: DC | PRN
  Administered 2021-04-21 (×2): 1 mg via INTRAVENOUS
  Filled 2021-04-21 (×2): qty 1

## 2021-04-22 ENCOUNTER — Ambulatory Visit: Payer: Self-pay | Admitting: *Deleted

## 2021-04-22 DIAGNOSIS — J9611 Chronic respiratory failure with hypoxia: Secondary | ICD-10-CM

## 2021-04-22 DIAGNOSIS — I5032 Chronic diastolic (congestive) heart failure: Secondary | ICD-10-CM

## 2021-04-22 NOTE — Chronic Care Management (AMB) (Signed)
Chronic Care Management   CCM RN Visit Note  04/22/2021 Name: [name] [name] MRN: [id number] DOB: 05-22-1931  Subjective: [name] [name] is a 87 y.o. year old female who is a primary care patient of Hoyt Koch, MD. The care management team was consulted for assistance with disease management and care coordination needs.    Notified that patient is now deceased; case closure/ care plan goals completed accordingly--  in response to provider referral for case management and/or care coordination services.   Consent to Services:  The patient was given information about Chronic Care Management services, agreed to services, and gave verbal consent prior to initiation of services.  Please see initial visit note for detailed documentation.  Patient agreed to services 10/08/20 and verbal consent obtained.   Assessment: Review of patient past medical history, allergies, medications, health status, including review of consultants reports, laboratory and other test data, was performed as part of comprehensive evaluation and provision of chronic care management services.  CCM Care Plan  Allergies  Allergen Reactions   Crestor [Rosuvastatin] Other (See Comments)    Muscle pain   Lactose Intolerance (Gi) Other (See Comments)    Stomach upset   Penicillins Hives   Imuran [Azathioprine] Nausea And Vomiting   Outpatient Encounter Medications as of 04/22/2021  Medication Sig Note   acetaminophen (TYLENOL) 500 MG tablet Take 1,000 mg by mouth in the morning and at bedtime.    Amino Acids-Protein Hydrolys (PRO-STAT AWC) LIQD Take 30 mLs by mouth daily.    atorvastatin (LIPITOR) 20 MG tablet Take 1 tablet (20 mg total) by mouth daily. (Patient taking differently: Take 10 mg by mouth daily.)    Dextromethorphan-guaiFENesin (ROBITUSSIN DM PO) Take 10 mLs by mouth every 6 (six) hours as needed (cough/congestion).    Docusate Sodium 100 MG capsule Take 100 mg by mouth daily.    famotidine (PEPCID) 20  MG tablet Take 20 mg by mouth daily.    gabapentin (NEURONTIN) 300 MG capsule Take 1 capsule (300 mg total) by mouth 3 (three) times daily.    guaiFENesin (ROBITUSSIN) 100 MG/5ML liquid Take 100 mg by mouth every 8 (eight) hours as needed for cough.    lipase/protease/amylase (CREON) 36000 UNITS CPEP capsule Take 1 capsule (36,000 Units total) by mouth 3 (three) times daily before meals.    midodrine (PROAMATINE) 5 MG tablet Take 1 tablet (5 mg total) by mouth 3 (three) times daily with meals.    mirtazapine (REMERON) 30 MG tablet Take 15 mg by mouth at bedtime.    Multiple Vitamin (MULTIVITAMIN WITH MINERALS) TABS tablet Take 1 tablet by mouth daily.    mycophenolate (CELLCEPT) 500 MG tablet TAKE 2 TABLETS BY MOUTH TWICE DAILY (Patient not taking: No sig reported) 03/22/2021: Not listed on MAR but 90 days supply was dispensed 7.11.22   nitroGLYCERIN (NITROSTAT) 0.3 MG SL tablet Place 0.3 mg under the tongue every 5 (five) minutes x 3 doses as needed for chest pain.    Nutritional Supplements (ENSURE ORIGINAL PO) Take 237 mLs by mouth 4 (four) times daily.    oxyCODONE (OXY IR/ROXICODONE) 5 MG immediate release tablet Take 1 tablet (5 mg total) by mouth every 4 (four) hours as needed for severe pain.    predniSONE (DELTASONE) 5 MG tablet TAKE 1 TABLET DAILY WITH BREAKFAST (Patient taking differently: Take 5 mg by mouth daily with breakfast.) 03/22/2021: Continuous   No facility-administered encounter medications on file as of 04/22/2021.   Patient Active Problem List  Diagnosis Date Noted   Nonhealing surgical wound, initial encounter    Palliative care by specialist    Goals of care, counseling/discussion    General weakness    Chronic dermatitis    AMS (altered mental status) 04/07/2021   Chronic osteomyelitis of sacrum ([place]) 04/07/2021   Fecal impaction ([place]) 04/07/2021   Leukocytosis 04/07/2021   Chronic wound infection of abdomen 04/07/2021   Debility 11/04/2020   Dysphagia    Oral  thrush    Encounter for dental examination    Facial swelling    Atrophy of edentulous alveolar ridge    Abdominal wall cellulitis 10/24/2020   Severe sepsis ([place]) 10/23/2020   Abdominal pain 10/20/2020   Diarrhea 10/02/2020   Pressure injury of skin 09/06/2020   Acute on chronic respiratory failure with hypoxemia ([place]) 08/23/2020   Protein-calorie malnutrition, severe 08/20/2020   Fall 01/25/2020   Other fatigue 01/03/2020   Left shoulder pain 01/03/2020   Urinary frequency 01/03/2020   Acute otalgia, left 09/06/2019   Unintentional weight loss 07/18/2019   Personal history of PE (pulmonary embolism) 04/23/2019   Headache 02/11/2019   Iron deficiency anemia 12/21/2018   Cold sore 10/23/2018   Blood in stool 09/14/2018   Guttate psoriasis 07/19/2018   Therapeutic drug monitoring 07/17/2018   Chronic diastolic CHF (congestive heart failure) ([place]) 12/19/2017   Rash 08/11/2017   Chronic respiratory failure with hypoxia ([place]) 03/30/2017   Angular cheilitis 03/08/2017   Leg pain 10/25/2016   Back pain 08/05/2016   RA (rheumatoid arthritis) ([place]) 03/31/2015   Allergic rhinitis    Varicose vein 09/11/2014   ILD (interstitial lung disease) ([place]) 06/25/2012   Chest pain 02/27/2012   CAD (coronary artery disease) 02/27/2012   Pruritus 08/18/2009   Postinflammatory pulmonary fibrosis / RA ILD  06/30/2008   Constipation 01/03/2008   Dyslipidemia 06/16/2007   Conditions to be addressed/monitored:  CHF and Chronic respiratory failure; failure to thrive  Care Plan : General Plan of Care (Adult)  Updates made by Knox Royalty, RN since 04/22/2021 12:00 AM     Problem: Quality of Life (General Plan of Care)   Priority: Medium     Long-Range Goal: Quality of Life Maintained Completed 04/22/2021  Start Date: 10/21/2020  Expected End Date: 04/22/2021  Recent Progress: On track  Priority: Medium  Note:   Current Barriers:  Ineffective Self Health Maintenance  Unable to  independently care for self at home: using private duty caregivers post- hospitalization/ surgery in February 2022 Unable to self administer medications as prescribed- private duty caregivers prepare/ provide medications and meals Unable to perform ADLs independently- requires assistance from private duty caregivers and family members Multiple complications with surgical abdominal wound and sacral decubitus ulcer healing Clinical Goal(s):  Collaboration with Hoyt Koch, MD regarding development and update of comprehensive plan of care as evidenced by provider attestation and co-signature Inter-disciplinary care team collaboration (see longitudinal plan of care)  Over the next 6 months, patient will work with care management team to address care coordination and chronic disease management needs related to  Disease Management Educational Needs Care Coordination Need for palliative care involvement in elderly patient with multiple chronic conditions    Interventions:  Notified that patient is now deceased; case closure/ care plan goals completed accordingly    Plan: No further follow up required: notified patient is deceased  [name], RN, BSN, [facility] RN Care Coordination- [place] [phone]: direct office [phone]: mobile

## 2021-05-11 NOTE — Progress Notes (Signed)
Daily Progress Note   Patient Name: [name] [name]       Date: 23-Apr-2021 DOB: Dec 02, 1930  Age: 87 y.o. MRN#: [id number] Attending Physician: Mariel Aloe, MD Primary Care Physician: Hoyt Koch, MD Admit Date: 03/17/2021  Reason for Consultation/Follow-up: Establishing goals of care  Subjective: I saw and examined [name] [name] today.  She continues to be in the active dying stages, she remains unresponsive, appears comfortable, at times family has noticed some furrowing of the brow/wincing grimacing.  We discussed about judicious use of opioids and benzodiazepines at this stage for ensuring appropriate comfort.  Briefly also discussed about end-of-life signs and symptoms again.  I met with the patient's nephew [name] [name] who is holding vigil at bedside, another family member was also present.   Length of Stay: 14  Current Medications: Scheduled Meds:   famotidine  20 mg Oral Daily   metroNIDAZOLE   Topical BID   mirtazapine  15 mg Oral QHS   nystatin   Topical TID   polyethylene glycol  17 g Oral Daily   senna-docusate  1 tablet Oral BID   silver sulfADIAZINE   Topical Daily    Continuous Infusions:    PRN Meds: acetaminophen **OR** acetaminophen, diphenhydrAMINE-zinc acetate, haloperidol lactate, HYDROmorphone (DILAUDID) injection, liver oil-zinc oxide, LORazepam, ondansetron **OR** ondansetron (ZOFRAN) IV, oxyCODONE, Thrombi-Pad  Physical Exam         Not awake not alert appears comfortable. Appears frail Coarse breath sounds, gurgling Respirations with mandibular movement, drooping of nasolabial fold. No distress Unresponsive Vital Signs: BP (!) 97/57 (BP Location: Right Arm)   Pulse (!) 121   Temp 98.4 F (36.9 C) (Oral)   Resp 14   Ht '5\' 4"'  (1.626 m)   Wt  43.4 kg   SpO2 96%   BMI 16.42 kg/m  SpO2: SpO2: 96 % O2 Device: O2 Device: Nasal Cannula O2 Flow Rate: O2 Flow Rate (L/min): 2 L/min  Intake/output summary:  Intake/Output Summary (Last 24 hours) at 04-23-2021 1120 Last data filed at 04/20/2021 1500 Gross per 24 hour  Intake --  Output 125 ml  Net -125 ml    LBM: Last BM Date: 04/19/21 Baseline Weight: Weight: 43.4 kg Most recent weight: Weight: 43.4 kg       Palliative Assessment/Data:      Patient Active Problem  List   Diagnosis Date Noted   Nonhealing surgical wound, initial encounter    Palliative care by specialist    Goals of care, counseling/discussion    General weakness    Chronic dermatitis    AMS (altered mental status) 04/07/2021   Chronic osteomyelitis of sacrum ([place]) 04/07/2021   Fecal impaction ([place]) 04/07/2021   Leukocytosis 04/07/2021   Chronic wound infection of abdomen 04/07/2021   Debility 11/04/2020   Dysphagia    Oral thrush    Encounter for dental examination    Facial swelling    Atrophy of edentulous alveolar ridge    Abdominal wall cellulitis 10/24/2020   Severe sepsis ([place]) 10/23/2020   Abdominal pain 10/20/2020   Diarrhea 10/02/2020   Pressure injury of skin 09/06/2020   Acute on chronic respiratory failure with hypoxemia (HCC) 08/23/2020   Protein-calorie malnutrition, severe 08/20/2020   Fall 01/25/2020   Other fatigue 01/03/2020   Left shoulder pain 01/03/2020   Urinary frequency 01/03/2020   Acute otalgia, left 09/06/2019   Unintentional weight loss 07/18/2019   Personal history of PE (pulmonary embolism) 04/23/2019   Headache 02/11/2019   Iron deficiency anemia 12/21/2018   Cold sore 10/23/2018   Blood in stool 09/14/2018   Guttate psoriasis 07/19/2018   Therapeutic drug monitoring 07/17/2018   Chronic diastolic CHF (congestive heart failure) ([place]) 12/19/2017   Rash 08/11/2017   Chronic respiratory failure with hypoxia ([place]) 03/30/2017   Angular cheilitis  03/08/2017   Leg pain 10/25/2016   Back pain 08/05/2016   RA (rheumatoid arthritis) ([place]) 03/31/2015   Allergic rhinitis    Varicose vein 09/11/2014   ILD (interstitial lung disease) ([place]) 06/25/2012   Chest pain 02/27/2012   CAD (coronary artery disease) 02/27/2012   Pruritus 08/18/2009   Postinflammatory pulmonary fibrosis / RA ILD  06/30/2008   Constipation 01/03/2008   Dyslipidemia 06/16/2007    Palliative Care Assessment & Plan   Patient Profile:  87 year old lady known to palliative medicine service from previous hospitalization, see surgery in outpatient setting.  Small bowel obstruction in February for which he underwent ex lap with detorsion of her small bowel alongside placement of G-tube.  Prolonged postoperative course after that and was discharged to SNF.  Several recurrent admissions for weakness failure to thrive as well as leakage around her G-tube.  G-tube replaced by interventional radiology in July for concern for clogging.  Patient removed G-tube in August and has had a significant excoriation and wound secondary to leakage.  Patient admitted to hospital medicine service for abdominal wound and failure to thrive. Decision made not to replace G tube and goals moving forward is focus on comfort.  Assessment:  Leakage at prior gastrostomy site Generalized weakness Generalized pain Palliative performance scale 10%. Recommendations/Plan: Plan moving forward is for continuation of full comfort.  She appears comfortable at time of my examination.  Continue comfort measures. Medication history noted, continue current pain and on pain symptom management measures Prognosis Hours to some very limited number of days in my opinion, recommend continuation of comfort measures, anticipate hospital death  Goals of Care and Additional Recommendations: Limitations on Scope of Treatment: Comfort measures  Code Status: DNR and full comfort measures.  Prognosis: Likely hours to  some very limited number of days  Discharge Planning: Anticipated hospital death  Thank you for allowing the Palliative Medicine Team to assist in the care of this patient.   Total Time 35 Prolonged Time Billed  no    Greater than 50%  of this time was spent counseling and coordinating care related to the above assessment and plan.  Loistine Chance, MD  Please contact Palliative Medicine Team phone at [phone] for questions and concerns.

## 2021-05-11 NOTE — Progress Notes (Signed)
Pt [facility] name is [facility] as per the family .

## 2021-05-11 NOTE — Progress Notes (Signed)
   CC: Confusion  Subjective: Some leaking from G-tube site  BP (!) 93/58 (BP Location: Right Arm)   Pulse (!) 115   Temp 98.3 F (36.8 C) (Axillary)   Resp 16   Ht 5\' 4"  (1.626 m)   Wt 43.4 kg   SpO2 97%   BMI 16.42 kg/m   General exam: Appears calm and comfortable Gastrointestinal system: Abdomen is non-distended. G-tube site with clean guaze.   Brief assessment/Plan:  Principal Problem:   AMS (altered mental status) Active Problems:   Chronic diastolic CHF (congestive heart failure) (HCC)   Chronic osteomyelitis of sacrum (HCC)   Fecal impaction (HCC)   Leukocytosis   Chronic wound infection of abdomen   Chronic dermatitis   Nonhealing surgical wound, initial encounter   Palliative care by specialist   Goals of care, counseling/discussion   General weakness  Patient is an 87 year old female past medical history of diastolic heart failure, a chronic nonhealing abdominal wound history of DVT/PE and CAD who presented to the emergency room from her skilled nursing facility on 9/27 with confusion.  During course of work-up patient found to have sacral decubitus ulcers with secondary osteomyelitis.  Altered mental status was likely multifactorial in setting of infection and dehydration. Given severity/chronic nature of wounds, after discussion with infectious disease and general surgery and given her poor wound status, patient was transitioned comfort care.  Palliative care medicine consulted and patient transitioned to comfort focused care. -Continue Dilaudid, Ativan, Oxycodone prn -Palliative care medicine recommendations: no new recommendations   Family communication: Nephew at bedside DVT prophylaxis: Comfort measures Disposition: Anticipate in-hospital death  Cordelia Poche, MD [facility] May 04, 2021, 5:03 PM

## 2021-05-11 NOTE — Progress Notes (Signed)
    OVERNIGHT PROGRESS REPORT  Notified by RN that patient has expired at 2018 Hrs.  Patient was DNR and consult with Palliative Care and Comfort Care   2 RN verified.  Family was available to physician and Nursing staff    Gershon Cull MSNA [facility] Acute Care Nurse Practitioner [place]

## 2021-05-11 NOTE — Death Summary Note (Signed)
DEATH SUMMARY   Patient Details  Name: [name] [name] MRN: [id number] DOB: 03-10-1931  Admission/Discharge Information   Admit Date:  Apr 13, 2021  Date of Death: Date of Death: 2021/04/28  Time of Death: Time of Death: 10-01-16  Length of Stay: 2022/09/29  Referring Physician: Hoyt Koch, MD   Reason(s) for Hospitalization  Altered mental status  Diagnoses  Preliminary cause of death:  Secondary Diagnoses (including complications and co-morbidities):  Principal Problem:   AMS (altered mental status) Active Problems:   Chronic diastolic CHF (congestive heart failure) (HCC)   Chronic osteomyelitis of sacrum (HCC)   Fecal impaction (HCC)   Leukocytosis   Chronic wound infection of abdomen   Chronic dermatitis   Nonhealing surgical wound, initial encounter   Palliative care by specialist   Goals of care, counseling/discussion   General weakness   Brief Hospital Course (including significant findings, care, treatment, and services provided and events leading to death)   Patient is an 87 year old female past medical history of diastolic heart failure, a chronic nonhealing abdominal wound history of DVT/PE and CAD who presented to the emergency room from her skilled nursing facility on 04/14/23 with confusion.  During course of work-up patient found to have sacral decubitus ulcers with secondary osteomyelitis.  Altered mental status was likely multifactorial in setting of infection and dehydration. Given severity/chronic nature of wounds, after discussion with infectious disease and general surgery and given her poor wound status, patient was transitioned comfort care.  Palliative care medicine consulted and patient transitioned to comfort focused care.    Pertinent Labs and Studies  Significant Diagnostic Studies DG Chest 2 View  Result Date: 04-13-2021 CLINICAL DATA:  Sepsis EXAM: CHEST - 2 VIEW COMPARISON:  03/22/2021 FINDINGS: Chronic, extensive bilateral lower lung zone predominant  peripheral reticular infiltrate is again seen and appears stable in keeping with underlying pulmonary fibrosis, better appreciated on CT examination of 03/26/2021. No superimposed focal pulmonary infiltrate. No pneumothorax or pleural effusion. Cardiac size within normal limits. No acute bone abnormality. IMPRESSION: Chronic infiltrate in keeping with pulmonary fibrosis. No superimposed acute cardiopulmonary disease. Electronically Signed   By: Fidela Salisbury M.D.   On: Apr 13, 2021 23:14   CT HEAD WO CONTRAST (5MM)  Result Date: 04/07/2021 CLINICAL DATA:  Encephalopathy EXAM: CT HEAD WITHOUT CONTRAST TECHNIQUE: Contiguous axial images were obtained from the base of the skull through the vertex without intravenous contrast. COMPARISON:  03/22/2021 FINDINGS: Brain: There is no mass, hemorrhage or extra-axial collection. There is generalized atrophy without lobar predilection. Hypodensity of the white matter is most commonly associated with chronic microvascular disease. Vascular: No abnormal hyperdensity of the major intracranial arteries or dural venous sinuses. No intracranial atherosclerosis. Skull: The visualized skull base, calvarium and extracranial soft tissues are normal. Sinuses/Orbits: No fluid levels or advanced mucosal thickening of the visualized paranasal sinuses. No mastoid or middle ear effusion. The orbits are normal. IMPRESSION: Generalized atrophy and chronic microvascular ischemia without acute intracranial abnormality. Electronically Signed   By: Ulyses Jarred M.D.   On: 04/07/2021 00:34   CT ABDOMEN PELVIS W CONTRAST  Result Date: 04/07/2021 CLINICAL DATA:  Diffuse abdominal pain, gastrostomy tube not in place, chronic nonhealing wound EXAM: CT ABDOMEN AND PELVIS WITH CONTRAST TECHNIQUE: Multidetector CT imaging of the abdomen and pelvis was performed using the standard protocol following bolus administration of intravenous contrast. CONTRAST:  56mL OMNIPAQUE IOHEXOL 350 MG/ML SOLN  COMPARISON:  10/24/2020 FINDINGS: Lower chest: Subpleural reticulation/fibrosis in the visualized lungs, suggesting chronic interstitial lung disease, grossly  unchanged. Hepatobiliary: Liver is within normal limits. Distended gallbladder, chronic. No intrahepatic ductal dilatation. Dilated common duct, measuring 16 mm (coronal image 38), chronic. Pancreas: Within normal limits. Spleen: Within normal limits. Adrenals/Urinary Tract: Adrenal glands are within normal limits. Kidneys are within normal limits.  No hydronephrosis. Bladder is within normal limits. Stomach/Bowel: Stomach is notable for a percutaneous gastrostomy tract along the left anterior abdominal wall (series 2/image 41) without indwelling gastrostomy tube. No evidence of bowel obstruction. Appendix is not discretely visualized. Scattered left colonic diverticulosis, without evidence of diverticulitis. Moderate left colonic stool burden, suggesting constipation. Moderate to large rectal stool burden (series 2/image 68), suggesting fecal impaction. Vascular/Lymphatic: No evidence of abdominal aortic aneurysm. Atherosclerotic calcifications of the abdominal aorta and branch vessels. No suspicious abdominopelvic lymphadenopathy. Reproductive: Status post hysterectomy. No adnexal masses. Other: No abdominopelvic ascites. Musculoskeletal: Mild soft tissue thickening with a subcutaneous tract in the left medial gluteal region (series 2/image 70), extending to the coccyx, suggesting a sacral decubitus ulcer with overlying chronic osteomyelitis (sagittal image 82). Mild degenerative changes of the lower lumbar spine. IMPRESSION: Sacral decubitus ulcer with overlying chronic osteomyelitis involving the coccyx. Constipation with moderate rectal fecal impaction. Percutaneous gastrostomy tract along the left mid abdominal wall, without visualization of the gastrostomy tube. Chronic gallbladder distension with dilated common duct. No intrahepatic ductal dilatation.  Additional stable ancillary findings as above. Electronically Signed   By: Julian Hy M.D.   On: 04/07/2021 01:05     Procedures/Operations     Cordelia Poche, MD 04/26/2021, 1:54 PM

## 2021-05-11 DEATH — deceased

## 2021-09-03 NOTE — Progress Notes (Signed)
X °

## 2022-09-30 IMAGING — CT CT MAXILLOFACIAL W/ CM
3 of 4 series · 14 of 47 positions shown, 17 images · IV contrast (APPLIED)
Comparison: CT head 10/24/2020.

CLINICAL DATA: Maxillofacial pain. Additional history provided:
Dotte Comme, mouth pain. Mouth pain also related to dental implants
screws with irritation. Lip swelling.

EXAM:
CT MAXILLOFACIAL WITH CONTRAST
TECHNIQUE: Multidetector CT imaging of the maxillofacial structures was
performed with intravenous contrast. Multiplanar CT image
reconstructions were also generated.
CONTRAST:  75mL OMNIPAQUE IOHEXOL 300 MG/ML  SOLN

[Series 3: max soft · axial · 0.38mm/px · z∈[-240,-66]mm · 9 of 101 slices shown, 12 images]
[im 7/101  brain]
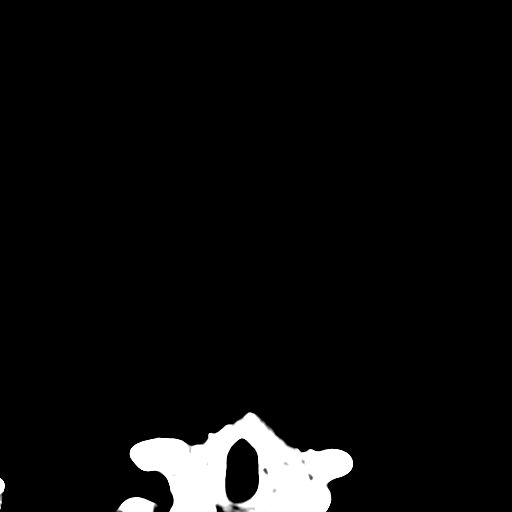
[im 7/101  bone]
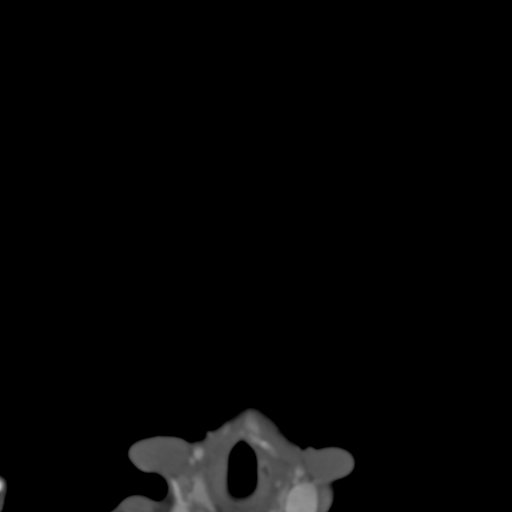
[im 18/101  bone]
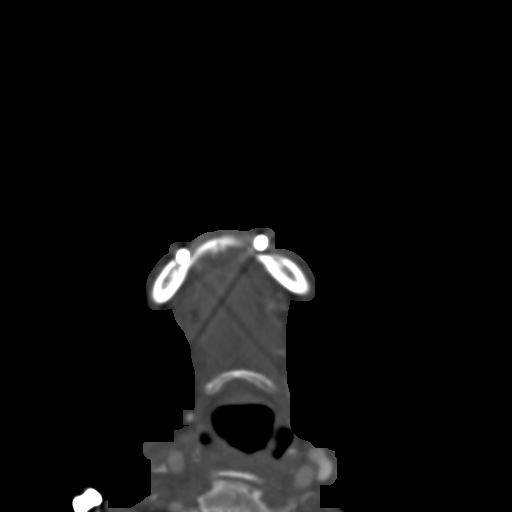
[im 28/101  bone]
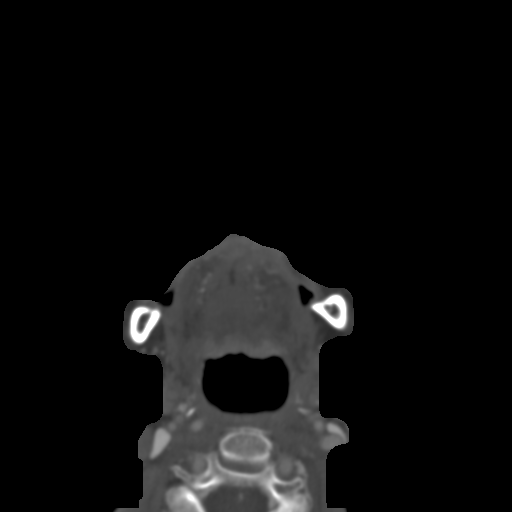
[im 38/101  bone]
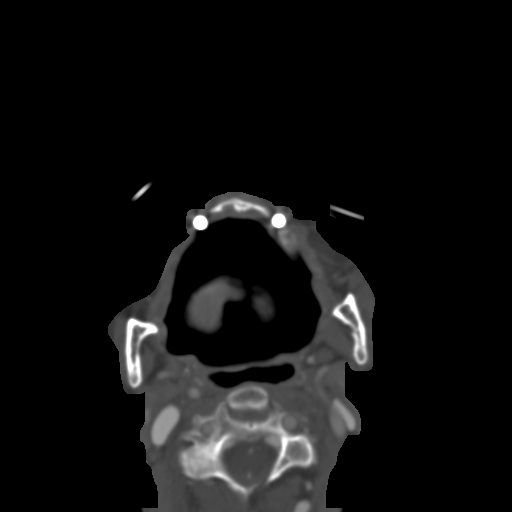
[im 52/101  brain]
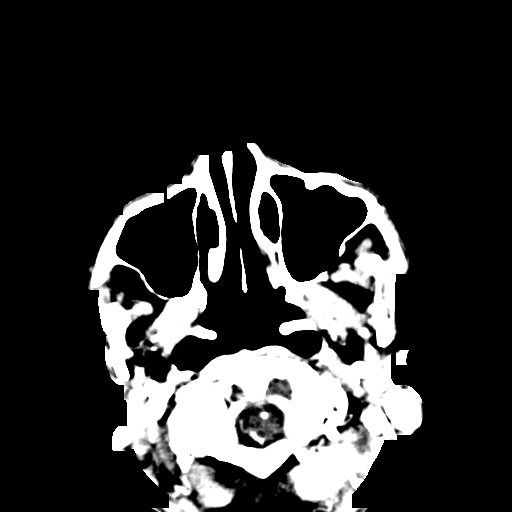
[im 52/101  bone]
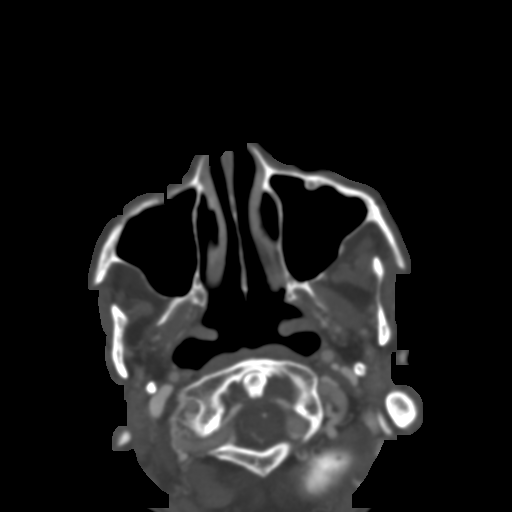
[im 63/101  bone]
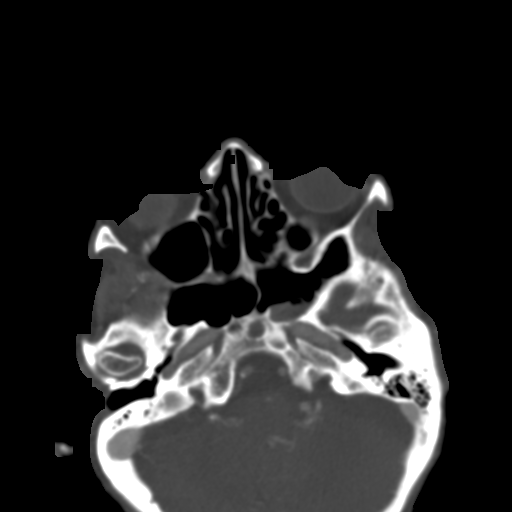
[im 73/101  bone]
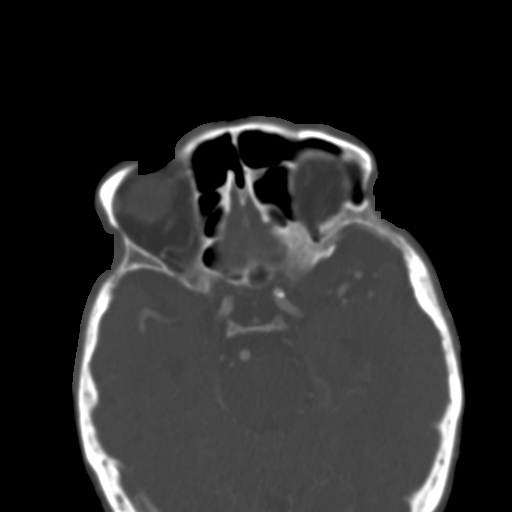
[im 83/101  bone]
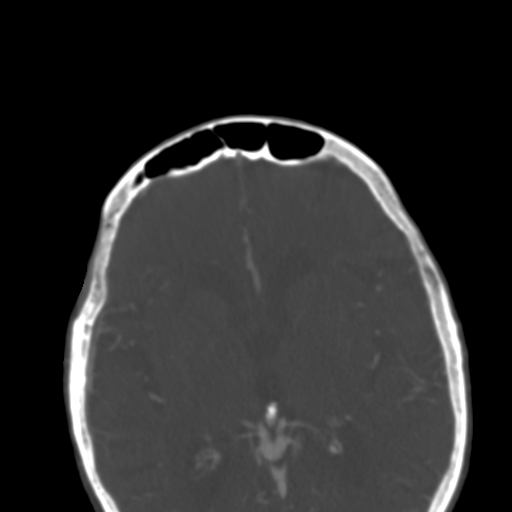
[im 94/101  brain]
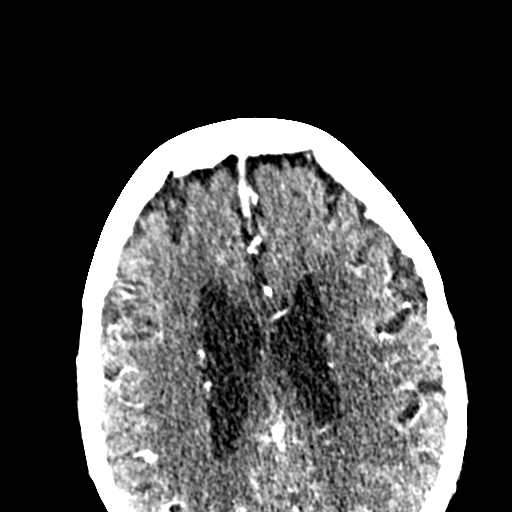
[im 94/101  bone]
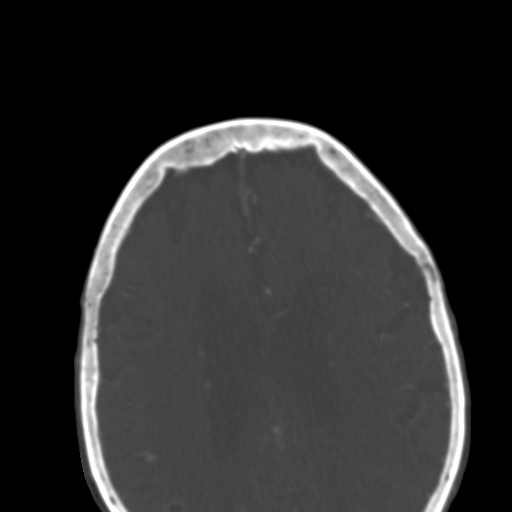

[Series 6: coronal soft · coronal · 0.33mm/px · 3 of 78 slices shown]
[im 26/78  bone]
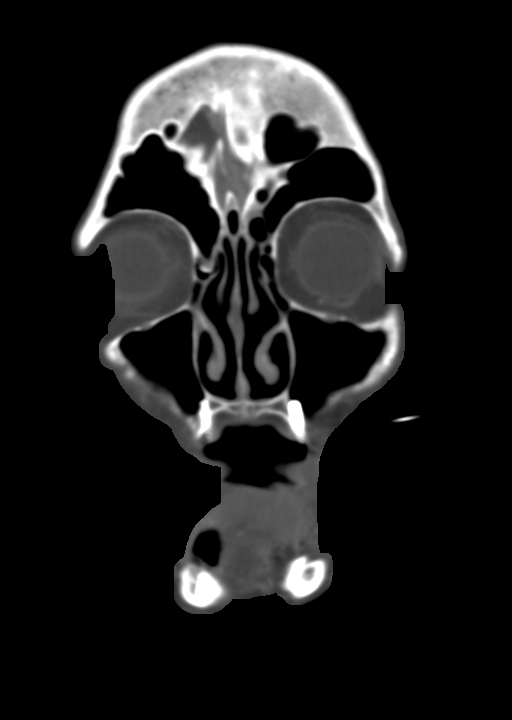
[im 35/78  bone]
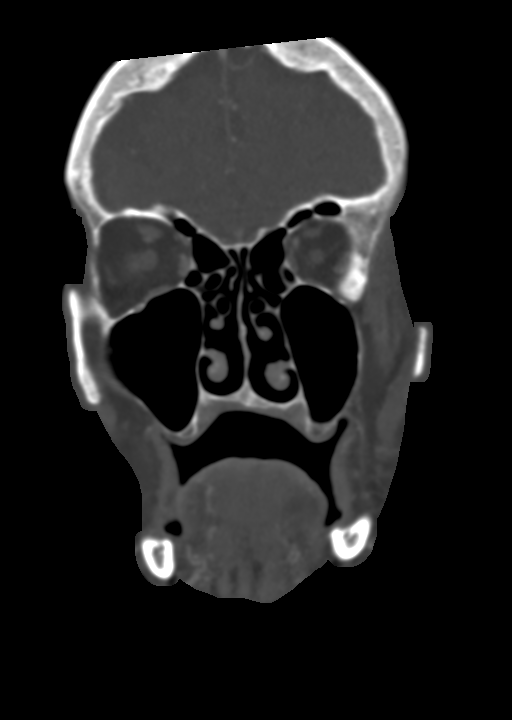
[im 43/78  bone]
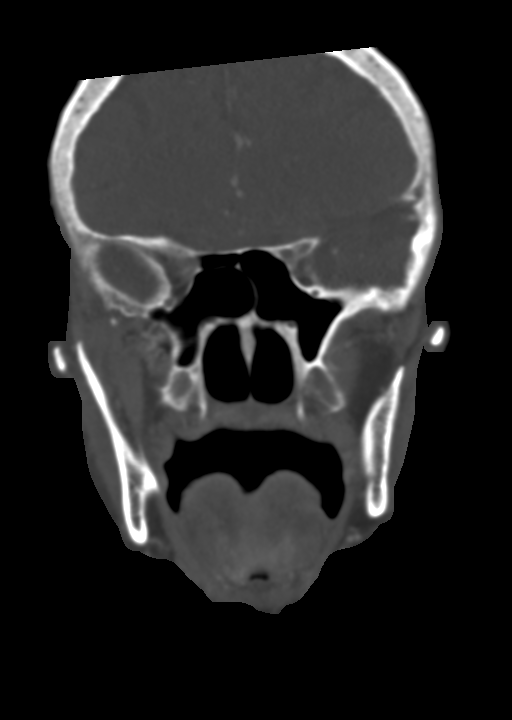

[Series 9: sagittal bone · sagittal · 0.31mm/px · 2 of 86 slices shown]
[im 29/86  bone]
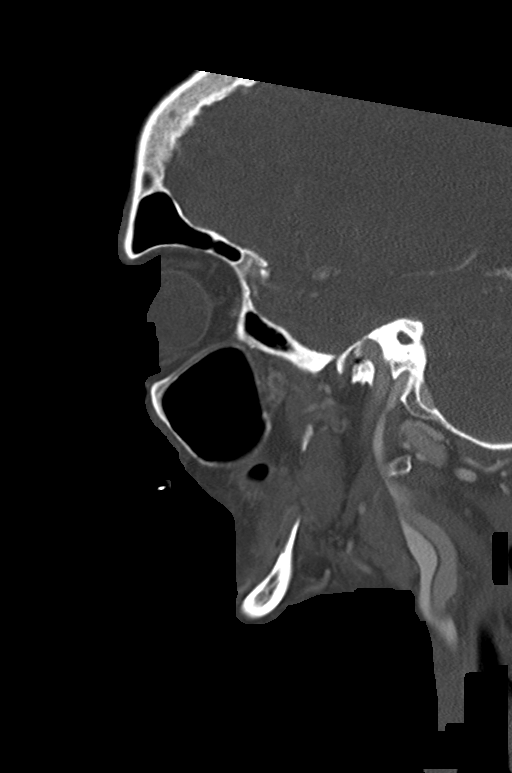
[im 57/86  bone]
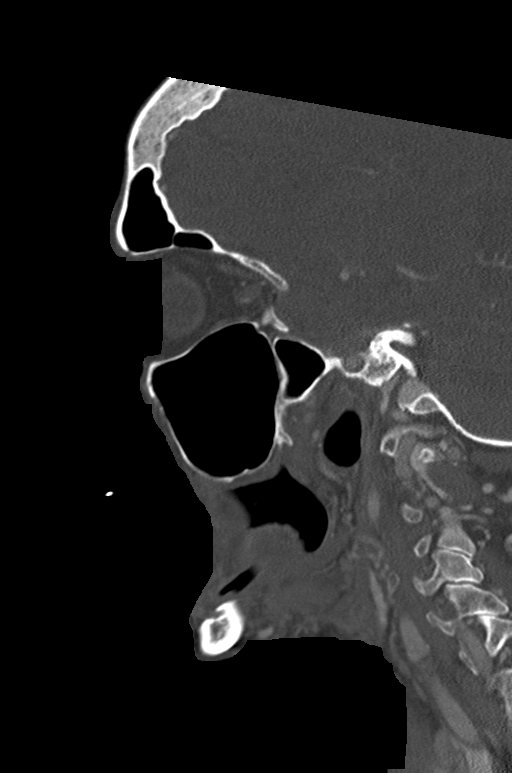

[14 of 47 positions shown; findings below may reference images not displayed]

FINDINGS: Osseous: The patient is edentulous. There are several metallic
dental prostheses. No acute bony abnormality or aggressive osseous
lesion.

Orbits: No acute or significant orbital finding. The globes are
normal in size and contour. The extraocular muscles and optic nerve
sheath complexes are symmetric and unremarkable. No intraorbital
mass or abnormal intraorbital enhancement.

Sinuses: Moderate volume frothy secretions and fluid level within
the left sphenoid sinus. The paranasal sinuses are otherwise
normally aerated.

Soft tissues: There is fairly diffuse mucosal enhancement within the
oral cavity. Findings likely reflect sequela of reported
[place]/mucositis. No appreciable mass within the oral cavity,
pharynx or larynx.

Limited intracranial: Redemonstrated cerebral atrophy and chronic
small vessel ischemic disease. No evidence of acute intracranial
abnormality within the field of view.
IMPRESSION: Fairly diffuse mucosal enhancement within the oral cavity. Findings
likely reflect sequela of reported [place]/mucositis.

The patient is edentulous, although there are several metallic
dental prostheses.

Left sphenoid sinusitis.
